# Patient Record
Sex: Male | Born: 1965
Health system: Southern US, Community
[De-identification: ages and names within clinical notes are randomized; demographics above are authoritative.]

## PROBLEM LIST (undated history)

## (undated) DIAGNOSIS — Z9289 Personal history of other medical treatment: Secondary | ICD-10-CM

## (undated) DIAGNOSIS — I2510 Atherosclerotic heart disease of native coronary artery without angina pectoris: Secondary | ICD-10-CM

## (undated) DIAGNOSIS — I639 Cerebral infarction, unspecified: Secondary | ICD-10-CM

## (undated) DIAGNOSIS — I10 Essential (primary) hypertension: Secondary | ICD-10-CM

## (undated) DIAGNOSIS — E785 Hyperlipidemia, unspecified: Secondary | ICD-10-CM

## (undated) DIAGNOSIS — K219 Gastro-esophageal reflux disease without esophagitis: Secondary | ICD-10-CM

## (undated) DIAGNOSIS — D8685 Sarcoid myocarditis: Secondary | ICD-10-CM

## (undated) DIAGNOSIS — Z95811 Presence of heart assist device: Secondary | ICD-10-CM

## (undated) DIAGNOSIS — I509 Heart failure, unspecified: Secondary | ICD-10-CM

## (undated) DIAGNOSIS — Z87442 Personal history of urinary calculi: Secondary | ICD-10-CM

## (undated) DIAGNOSIS — J449 Chronic obstructive pulmonary disease, unspecified: Secondary | ICD-10-CM

---

## 2019-01-17 DIAGNOSIS — I5020 Unspecified systolic (congestive) heart failure: Secondary | ICD-10-CM

## 2019-01-17 DIAGNOSIS — R000 Tachycardia, unspecified: Secondary | ICD-10-CM

## 2019-01-17 DIAGNOSIS — I472 Ventricular tachycardia: Secondary | ICD-10-CM

## 2019-01-17 HISTORY — DX: Ventricular tachycardia: I47.2

## 2019-01-17 HISTORY — DX: Unspecified systolic (congestive) heart failure: I50.20

## 2019-01-17 HISTORY — DX: Tachycardia, unspecified: R00.0

## 2019-06-27 LAB — HM COLONOSCOPY

## 2019-12-15 DIAGNOSIS — I361 Nonrheumatic tricuspid (valve) insufficiency: Secondary | ICD-10-CM

## 2019-12-15 DIAGNOSIS — I340 Nonrheumatic mitral (valve) insufficiency: Secondary | ICD-10-CM | POA: Diagnosis not present

## 2019-12-15 DIAGNOSIS — R079 Chest pain, unspecified: Secondary | ICD-10-CM

## 2019-12-15 DIAGNOSIS — I429 Cardiomyopathy, unspecified: Secondary | ICD-10-CM | POA: Diagnosis not present

## 2019-12-15 DIAGNOSIS — I472 Ventricular tachycardia: Secondary | ICD-10-CM

## 2019-12-15 DIAGNOSIS — R778 Other specified abnormalities of plasma proteins: Secondary | ICD-10-CM | POA: Diagnosis not present

## 2019-12-16 ENCOUNTER — Encounter (HOSPITAL_COMMUNITY)
Admission: AD | Disposition: A | Discharge status: Home / Self Care | Payer: Self-pay | Source: Ambulatory Visit | Attending: Cardiology

## 2019-12-16 ENCOUNTER — Inpatient Hospital Stay (HOSPITAL_COMMUNITY)
Admission: AD | Admit: 2019-12-16 | Discharge: 2019-12-19 | DRG: 286 | Disposition: A | Discharge status: Home / Self Care | Payer: BLUE CROSS/BLUE SHIELD | Source: Ambulatory Visit | Attending: Cardiology | Admitting: Cardiology

## 2019-12-16 ENCOUNTER — Encounter (HOSPITAL_COMMUNITY): Payer: Self-pay | Admitting: Cardiology

## 2019-12-16 ENCOUNTER — Other Ambulatory Visit: Payer: Self-pay

## 2019-12-16 DIAGNOSIS — I083 Combined rheumatic disorders of mitral, aortic and tricuspid valves: Secondary | ICD-10-CM | POA: Diagnosis present

## 2019-12-16 DIAGNOSIS — I429 Cardiomyopathy, unspecified: Secondary | ICD-10-CM

## 2019-12-16 DIAGNOSIS — I10 Essential (primary) hypertension: Secondary | ICD-10-CM | POA: Diagnosis present

## 2019-12-16 DIAGNOSIS — I472 Ventricular tachycardia: Secondary | ICD-10-CM | POA: Diagnosis not present

## 2019-12-16 DIAGNOSIS — R000 Tachycardia, unspecified: Secondary | ICD-10-CM

## 2019-12-16 DIAGNOSIS — I5023 Acute on chronic systolic (congestive) heart failure: Secondary | ICD-10-CM | POA: Diagnosis present

## 2019-12-16 DIAGNOSIS — Z87891 Personal history of nicotine dependence: Secondary | ICD-10-CM

## 2019-12-16 DIAGNOSIS — K219 Gastro-esophageal reflux disease without esophagitis: Secondary | ICD-10-CM | POA: Diagnosis present

## 2019-12-16 DIAGNOSIS — I272 Pulmonary hypertension, unspecified: Secondary | ICD-10-CM | POA: Diagnosis present

## 2019-12-16 DIAGNOSIS — Z885 Allergy status to narcotic agent status: Secondary | ICD-10-CM

## 2019-12-16 DIAGNOSIS — I248 Other forms of acute ischemic heart disease: Secondary | ICD-10-CM | POA: Diagnosis present

## 2019-12-16 DIAGNOSIS — N179 Acute kidney failure, unspecified: Secondary | ICD-10-CM

## 2019-12-16 DIAGNOSIS — I5041 Acute combined systolic (congestive) and diastolic (congestive) heart failure: Secondary | ICD-10-CM | POA: Diagnosis present

## 2019-12-16 DIAGNOSIS — R079 Chest pain, unspecified: Secondary | ICD-10-CM | POA: Diagnosis not present

## 2019-12-16 DIAGNOSIS — E782 Mixed hyperlipidemia: Secondary | ICD-10-CM | POA: Diagnosis not present

## 2019-12-16 DIAGNOSIS — Z8249 Family history of ischemic heart disease and other diseases of the circulatory system: Secondary | ICD-10-CM

## 2019-12-16 DIAGNOSIS — I5021 Acute systolic (congestive) heart failure: Secondary | ICD-10-CM | POA: Diagnosis present

## 2019-12-16 DIAGNOSIS — R778 Other specified abnormalities of plasma proteins: Secondary | ICD-10-CM

## 2019-12-16 DIAGNOSIS — I2510 Atherosclerotic heart disease of native coronary artery without angina pectoris: Secondary | ICD-10-CM | POA: Diagnosis present

## 2019-12-16 DIAGNOSIS — I25118 Atherosclerotic heart disease of native coronary artery with other forms of angina pectoris: Secondary | ICD-10-CM | POA: Diagnosis not present

## 2019-12-16 DIAGNOSIS — E785 Hyperlipidemia, unspecified: Secondary | ICD-10-CM | POA: Diagnosis present

## 2019-12-16 DIAGNOSIS — I110 Hypertensive heart disease with heart failure: Secondary | ICD-10-CM | POA: Diagnosis present

## 2019-12-16 DIAGNOSIS — I428 Other cardiomyopathies: Secondary | ICD-10-CM | POA: Diagnosis present

## 2019-12-16 DIAGNOSIS — I5022 Chronic systolic (congestive) heart failure: Secondary | ICD-10-CM

## 2019-12-16 HISTORY — PX: RIGHT/LEFT HEART CATH AND CORONARY ANGIOGRAPHY: CATH118266

## 2019-12-16 HISTORY — DX: Essential (primary) hypertension: I10

## 2019-12-16 HISTORY — DX: Gastro-esophageal reflux disease without esophagitis: K21.9

## 2019-12-16 LAB — POCT I-STAT 7, (LYTES, BLD GAS, ICA,H+H)
Acid-Base Excess: 6 mmol/L — ABNORMAL HIGH (ref 0.0–2.0)
Bicarbonate: 34.1 mmol/L — ABNORMAL HIGH (ref 20.0–28.0)
Calcium, Ion: 1.17 mmol/L (ref 1.15–1.40)
HCT: 45 % (ref 39.0–52.0)
Hemoglobin: 15.3 g/dL (ref 13.0–17.0)
O2 Saturation: 100 %
Potassium: 4 mmol/L (ref 3.5–5.1)
Sodium: 143 mmol/L (ref 135–145)
TCO2: 36 mmol/L — ABNORMAL HIGH (ref 22–32)
pCO2 arterial: 66 mmHg (ref 32.0–48.0)
pH, Arterial: 7.322 — ABNORMAL LOW (ref 7.350–7.450)
pO2, Arterial: 191 mmHg — ABNORMAL HIGH (ref 83.0–108.0)

## 2019-12-16 LAB — POCT I-STAT EG7
Acid-Base Excess: 4 mmol/L — ABNORMAL HIGH (ref 0.0–2.0)
Acid-Base Excess: 4 mmol/L — ABNORMAL HIGH (ref 0.0–2.0)
Bicarbonate: 33.5 mmol/L — ABNORMAL HIGH (ref 20.0–28.0)
Bicarbonate: 33.6 mmol/L — ABNORMAL HIGH (ref 20.0–28.0)
Calcium, Ion: 1.23 mmol/L (ref 1.15–1.40)
Calcium, Ion: 1.24 mmol/L (ref 1.15–1.40)
HCT: 46 % (ref 39.0–52.0)
HCT: 46 % (ref 39.0–52.0)
Hemoglobin: 15.6 g/dL (ref 13.0–17.0)
Hemoglobin: 15.6 g/dL (ref 13.0–17.0)
O2 Saturation: 71 %
O2 Saturation: 73 %
Potassium: 4.1 mmol/L (ref 3.5–5.1)
Potassium: 4.2 mmol/L (ref 3.5–5.1)
Sodium: 143 mmol/L (ref 135–145)
Sodium: 143 mmol/L (ref 135–145)
TCO2: 36 mmol/L — ABNORMAL HIGH (ref 22–32)
TCO2: 36 mmol/L — ABNORMAL HIGH (ref 22–32)
pCO2, Ven: 68.9 mmHg — ABNORMAL HIGH (ref 44.0–60.0)
pCO2, Ven: 69.3 mmHg — ABNORMAL HIGH (ref 44.0–60.0)
pH, Ven: 7.294 (ref 7.250–7.430)
pH, Ven: 7.295 (ref 7.250–7.430)
pO2, Ven: 43 mmHg (ref 32.0–45.0)
pO2, Ven: 44 mmHg (ref 32.0–45.0)

## 2019-12-16 LAB — CREATININE, SERUM
Creatinine, Ser: 0.98 mg/dL (ref 0.61–1.24)
GFR, Estimated: >60 mL/min (ref >60)

## 2019-12-16 LAB — CBC
HCT: 55.3 % — ABNORMAL HIGH (ref 39.0–52.0)
Hemoglobin: 17.7 g/dL — ABNORMAL HIGH (ref 13.0–17.0)
MCH: 31.7 pg (ref 26.0–34.0)
MCHC: 32 g/dL (ref 30.0–36.0)
MCV: 99.1 fL (ref 80.0–100.0)
Platelets: 173 10*3/uL (ref 150–400)
RBC: 5.58 MIL/uL (ref 4.22–5.81)
RDW: 11.5 % (ref 11.5–15.5)
WBC: 7 10*3/uL (ref 4.0–10.5)
nRBC: 0 % (ref 0.0–0.2)

## 2019-12-16 SURGERY — RIGHT/LEFT HEART CATH AND CORONARY ANGIOGRAPHY
Anesthesia: LOCAL

## 2019-12-16 MED ORDER — METOPROLOL SUCCINATE ER 25 MG PO TB24
25.0000 mg | ORAL_TABLET | Freq: Every day | ORAL | Status: DC
  Administered 2019-12-16 – 2019-12-17 (×2): 25 mg via ORAL
  Filled 2019-12-16 (×3): qty 1

## 2019-12-16 MED ORDER — ENOXAPARIN SODIUM 40 MG/0.4ML ~~LOC~~ SOLN
40.0000 mg | SUBCUTANEOUS | Status: DC
  Administered 2019-12-17 – 2019-12-19 (×3): 40 mg via SUBCUTANEOUS
  Filled 2019-12-16 (×3): qty 0.4

## 2019-12-16 MED ORDER — FENTANYL CITRATE (PF) 100 MCG/2ML IJ SOLN
INTRAMUSCULAR | Status: DC | PRN
  Administered 2019-12-16: 50 ug via INTRAVENOUS

## 2019-12-16 MED ORDER — LIDOCAINE HCL (PF) 1 % IJ SOLN
INTRAMUSCULAR | Status: AC
  Filled 2019-12-16: qty 30

## 2019-12-16 MED ORDER — SODIUM CHLORIDE 0.9 % IV SOLN: 250.0000 mL | INTRAVENOUS | Status: DC | PRN

## 2019-12-16 MED ORDER — VERAPAMIL HCL 2.5 MG/ML IV SOLN
INTRAVENOUS | Status: DC | PRN
  Administered 2019-12-16: 10 mL via INTRA_ARTERIAL

## 2019-12-16 MED ORDER — LIDOCAINE HCL (PF) 1 % IJ SOLN
INTRAMUSCULAR | Status: DC | PRN
  Administered 2019-12-16: 5 mL

## 2019-12-16 MED ORDER — HEPARIN (PORCINE) IN NACL 1000-0.9 UT/500ML-% IV SOLN
INTRAVENOUS | Status: AC
  Filled 2019-12-16: qty 1000

## 2019-12-16 MED ORDER — HEPARIN SODIUM (PORCINE) 1000 UNIT/ML IJ SOLN
INTRAMUSCULAR | Status: DC | PRN
  Administered 2019-12-16: 4000 [IU] via INTRAVENOUS

## 2019-12-16 MED ORDER — LOSARTAN POTASSIUM 25 MG PO TABS
25.0000 mg | ORAL_TABLET | Freq: Every day | ORAL | Status: DC
  Administered 2019-12-16 – 2019-12-17 (×2): 25 mg via ORAL
  Filled 2019-12-16 (×3): qty 1

## 2019-12-16 MED ORDER — IOHEXOL 350 MG/ML SOLN
INTRAVENOUS | Status: DC | PRN
  Administered 2019-12-16: 50 mL via INTRA_ARTERIAL

## 2019-12-16 MED ORDER — HEPARIN (PORCINE) IN NACL 1000-0.9 UT/500ML-% IV SOLN
INTRAVENOUS | Status: DC | PRN
  Administered 2019-12-16 (×2): 500 mL

## 2019-12-16 MED ORDER — FUROSEMIDE 10 MG/ML IJ SOLN
INTRAMUSCULAR | Status: AC
  Filled 2019-12-16: qty 4

## 2019-12-16 MED ORDER — ASPIRIN 81 MG PO CHEW
CHEWABLE_TABLET | ORAL | Status: AC
  Filled 2019-12-16: qty 1

## 2019-12-16 MED ORDER — HEPARIN SODIUM (PORCINE) 1000 UNIT/ML IJ SOLN
INTRAMUSCULAR | Status: AC
  Filled 2019-12-16: qty 1

## 2019-12-16 MED ORDER — FENTANYL CITRATE (PF) 100 MCG/2ML IJ SOLN
INTRAMUSCULAR | Status: AC
  Filled 2019-12-16: qty 2

## 2019-12-16 MED ORDER — SODIUM CHLORIDE 0.9% FLUSH
3.0000 mL | Freq: Two times a day (BID) | INTRAVENOUS | Status: DC
  Administered 2019-12-16 – 2019-12-19 (×4): 3 mL via INTRAVENOUS

## 2019-12-16 MED ORDER — ACETAMINOPHEN 325 MG PO TABS
650.0000 mg | ORAL_TABLET | ORAL | Status: DC | PRN
  Administered 2019-12-18: 650 mg via ORAL
  Filled 2019-12-16: qty 2

## 2019-12-16 MED ORDER — ONDANSETRON HCL 4 MG/2ML IJ SOLN: 4.0000 mg | Freq: Four times a day (QID) | INTRAMUSCULAR | Status: DC | PRN

## 2019-12-16 MED ORDER — SODIUM CHLORIDE 0.9% FLUSH: 3.0000 mL | INTRAVENOUS | Status: DC | PRN

## 2019-12-16 MED ORDER — SODIUM CHLORIDE 0.9 % IV SOLN: INTRAVENOUS | Status: DC

## 2019-12-16 MED ORDER — SODIUM CHLORIDE 0.9% FLUSH
3.0000 mL | Freq: Two times a day (BID) | INTRAVENOUS | Status: DC
  Administered 2019-12-17 – 2019-12-18 (×4): 3 mL via INTRAVENOUS

## 2019-12-16 MED ORDER — VERAPAMIL HCL 2.5 MG/ML IV SOLN
INTRAVENOUS | Status: AC
  Filled 2019-12-16: qty 2

## 2019-12-16 MED ORDER — MIDAZOLAM HCL 2 MG/2ML IJ SOLN
INTRAMUSCULAR | Status: DC | PRN
  Administered 2019-12-16: 2 mg via INTRAVENOUS

## 2019-12-16 MED ORDER — MIDAZOLAM HCL 2 MG/2ML IJ SOLN
INTRAMUSCULAR | Status: AC
  Filled 2019-12-16: qty 2

## 2019-12-16 MED ORDER — ASPIRIN 81 MG PO CHEW
81.0000 mg | CHEWABLE_TABLET | ORAL | Status: AC
  Administered 2019-12-16: 81 mg via ORAL

## 2019-12-16 MED ORDER — FUROSEMIDE 10 MG/ML IJ SOLN
40.0000 mg | Freq: Two times a day (BID) | INTRAMUSCULAR | Status: AC
  Administered 2019-12-16 – 2019-12-17 (×3): 40 mg via INTRAVENOUS
  Filled 2019-12-16 (×2): qty 4

## 2019-12-16 SURGICAL SUPPLY — 15 items
CATH 5FR JL3.5 JR4 ANG PIG MP (CATHETERS) ×2 IMPLANT
CATH INFINITI 5FR JK (CATHETERS) ×2 IMPLANT
CATH INFINITI 5FR JL4 (CATHETERS) ×2 IMPLANT
CATH LAUNCHER 5F RADL (CATHETERS) ×1 IMPLANT
CATH SWAN GANZ 7F STRAIGHT (CATHETERS) ×2 IMPLANT
CATHETER LAUNCHER 5F RADL (CATHETERS) ×2
DEVICE RAD COMP TR BAND LRG (VASCULAR PRODUCTS) ×2 IMPLANT
GLIDESHEATH SLEND SS 6F .021 (SHEATH) ×2 IMPLANT
GLIDESHEATH SLENDER 7FR .021G (SHEATH) ×2 IMPLANT
GUIDEWIRE INQWIRE 1.5J.035X260 (WIRE) ×1 IMPLANT
INQWIRE 1.5J .035X260CM (WIRE) ×2
KIT HEART LEFT (KITS) ×2 IMPLANT
PACK CARDIAC CATHETERIZATION (CUSTOM PROCEDURE TRAY) ×2 IMPLANT
TRANSDUCER W/STOPCOCK (MISCELLANEOUS) ×2 IMPLANT
TUBING CIL FLEX 10 FLL-RA (TUBING) ×2 IMPLANT

## 2019-12-16 NOTE — Progress Notes (Signed)
Patient received from [facility]. Patient alert and oriented. Denies chest pain or shortness of breath at this time. VSS. Patient placed on monitor. IVs patent. Consent obtained. Normal saline initated. ASA 81mg  given per order. Dr. to see patient.[place]

## 2019-12-16 NOTE — Plan of Care (Signed)

## 2019-12-16 NOTE — Interval H&P Note (Signed)
History and Physical Interval Note:  12/16/2019 2:41 PM  [facility]  has presented today for surgery, with the diagnosis of chest pain.  The various methods of treatment have been discussed with the patient and family. After consideration of risks, benefits and other options for treatment, the patient has consented to  Procedure(s): RIGHT/LEFT HEART CATH AND CORONARY ANGIOGRAPHY (N/A) as a surgical intervention.  The patient's history has been reviewed, patient examined, no change in status, stable for surgery.  I have reviewed the patient's chart and labs.  Questions were answered to the patient's satisfaction.    Cath Lab Visit (complete for each Cath Lab visit)  Clinical Evaluation Leading to the Procedure:   ACS: Yes.    Non-ACS:    Anginal Classification: CCS III  Anti-ischemic medical therapy: No Therapy  Non-Invasive Test Results: No non-invasive testing performed  Prior CABG: No previous CABG       [name] [facility] 12/16/2019 2:41 PM

## 2019-12-16 NOTE — H&P (Signed)
Cardiology Admission History and Physical:   Patient ID: [facility] MRN: [id number]; DOB: 1965-07-21   Admission date: 12/16/2019  Primary Care Provider: [name], PA-C [facility] Cardiologist: Norman Herrlich, MD  [facility] Electrophysiologist:  None   Chief Complaint:  Chest pain   Patient Profile:   [facility] is a 54 y.o. male with PMH of HTN and GERD who presented to [facility] with chest pain. Found to have a reduced EF, and episode of VT. Transferred to [facility] for further management.   History of Present Illness:   [facility] is a 54 yo male with PMH noted above. He has not been seen by cardiology in the past. Presented to [facility] with chest pain.  Reported over the last several weeks he has had increasing fatigue and dyspnea with activity. Reports he recently completed treatment for PNA. The morning of admission he was getting ready and loading furniture into a work truck whenever he started having burning pain in his chest.  No associated shortness of breath or nausea.  Pain continued through the morning and he decided to come home from work.  On the way home he checked his HR with his pulse ox and noted his heart rate was in the 150s.  Ultimately presented to the ED for further evaluation was found to have a wide-complex tachycardia.  He was given diltiazem without conversion, then given IV metoprolol and heart rate improved.  Troponin was noted at 0.18.  Cardiology was consulted who recommended admission with echocardiogram.  Echocardiogram showed EF of 25 to 30% impaired diastolic filling, mild aortic regurg, mild mitral regurgitation and tricuspid regurgitation.  Of note troponin I peaked at 1.19.  Decision made to transfer to [facility] for further management with cardiac catheterization.   Past Medical History:  Diagnosis Date   GERD (gastroesophageal reflux disease)    Hypertension     History reviewed. No pertinent surgical history.    Medications Prior to Admission: Prior to Admission medications   Not on File     Allergies:    Allergies  Allergen Reactions   Percocet [Oxycodone-Acetaminophen] Itching    Social History:   Social History   Socioeconomic History   Marital status: Divorced    Spouse name: Not on file   Number of children: Not on file   Years of education: Not on file   Highest education level: Not on file  Occupational History   Not on file  Tobacco Use   Smoking status: Not on file  Substance and Sexual Activity   Alcohol use: Not on file   Drug use: Not on file   Sexual activity: Not on file  Other Topics Concern   Not on file  Social History Narrative   Not on file   Social Determinants of Health   Financial Resource Strain:    Difficulty of Paying Living Expenses: Not on file  Food Insecurity:    Worried About Running Out of Food in the Last Year: Not on file   Ran Out of Food in the Last Year: Not on file  Transportation Needs:    Lack of Transportation (Medical): Not on file   Lack of Transportation (Non-Medical): Not on file  Physical Activity:    Days of Exercise per Week: Not on file   Minutes of Exercise per Session: Not on file  Stress:    Feeling of Stress : Not on file  Social Connections:    Frequency of Communication with  Friends and Family: Not on file   Frequency of Social Gatherings with Friends and Family: Not on file   Attends Religious Services: Not on file   Active Member of Clubs or Organizations: Not on file   Attends Banker Meetings: Not on file   Marital Status: Not on file  Intimate Partner Violence:    Fear of Current or Ex-Partner: Not on file   Emotionally Abused: Not on file   Physically Abused: Not on file   Sexually Abused: Not on file    Family History:   The patient's family history includes Heart disease in his father and mother; Hypertension in his father.    ROS:  Please see the  history of present illness.  All other ROS reviewed and negative.     Physical Exam/Data:  There were no vitals filed for this visit. No intake or output data in the 24 hours ending 12/16/19 1419 No flowsheet data found.   There is no height or weight on file to calculate BMI.  General:  Well nourished, well developed, in no acute distress HEENT: normal Lymph: no adenopathy Neck: no JVD Endocrine:  No thryomegaly Vascular: No carotid bruits; FA pulses 2+ bilaterally without bruits  Cardiac:  normal S1, S2; RRR; no murmur  Lungs:  clear to auscultation bilaterally, no wheezing, rhonchi or rales  Abd: soft, nontender, no hepatomegaly  Ext: no edema Musculoskeletal:  No deformities, BUE and BLE strength normal and equal Skin: warm and dry  Neuro:  CNs 2-12 intact, no focal abnormalities noted Psych:  Normal affect    EKG:  The ECG that was done 12/15/19 was personally reviewed and demonstrates WCT rate 157bpm  Relevant CV Studies:  Echo: EF 25-30% ( done at [facility])  Laboratory Data:  High Sensitivity Troponin:  No results for input(s): TROPONINIHS in the last 720 hours.    ChemistryNo results for input(s): NA, K, CL, CO2, GLUCOSE, BUN, CREATININE, CALCIUM, GFRNONAA, GFRAA, ANIONGAP in the last 168 hours.  No results for input(s): PROT, ALBUMIN, AST, ALT, ALKPHOS, BILITOT in the last 168 hours. HematologyNo results for input(s): WBC, RBC, HGB, HCT, MCV, MCH, MCHC, RDW, PLT in the last 168 hours. BNPNo results for input(s): BNP, PROBNP in the last 168 hours.  DDimer No results for input(s): DDIMER in the last 168 hours.   Radiology/Studies:  No results found.   Assessment and Plan:   [facility] is a 54 y.o. male with PMH of HTN and GERD who presented to [facility] with chest pain. Found to have a reduced EF, and episode of VT. Transferred to [facility] for further management.   1. Chest pain: presented to [facility] with fatigue and chest pain. Trop I peaked at  1.19. EKG with WCT noted on admission. Now is transferred to [facility] for further management with plans for cardiac cath. --The patient understands that risks included but are not limited to stroke (1 in 1000), death (1 in 1000), kidney failure [usually temporary] (1 in 500), bleeding (1 in 200), allergic reaction [possibly serious] (1 in 200).   2. WCT: concerning for VT. Given IV dilt without improvement, conversion with IV metoprolol.  --started on Toprol XL 25mg  daily at [facility]  3. Acute combined systolic/diastolic HF: EF noted at 25-30% with impaired diastolic filling on echo at [facility]. Started on Toprol XL 25mg  daily, along with lisinopril at [facility]. Does not appear volume overloaded on exam -- would favor holding lisinopril with possible transition to Entresto  4.  HTN: on Toprol XL, as above hold lisinopril.  [id number]} HEAR Score (for undifferentiated chest pain):  HEAR Score: 2   Severity of Illness: The appropriate patient status for this patient is OBSERVATION. Observation status is judged to be reasonable and necessary in order to provide the required intensity of service to ensure the patient's safety. The patient's presenting symptoms, physical exam findings, and initial radiographic and laboratory data in the context of their medical condition is felt to place them at decreased risk for further clinical deterioration. Furthermore, it is anticipated that the patient will be medically stable for discharge from the hospital within 2 midnights of admission. The following factors support the patient status of observation.   " The patient's presenting symptoms include chest pain, shortness of breath. " The physical exam findings include stable exam. " The initial radiographic and laboratory data are elevated troponin, EKG concerning for VT, Echo with EF of 25-30%.     For questions or updates, please contact [facility] Please consult [email or web address] for contact info under      Signed, Laverda Page, NP  12/16/2019 2:19 PM

## 2019-12-17 ENCOUNTER — Encounter (HOSPITAL_COMMUNITY): Payer: Self-pay | Admitting: Cardiology

## 2019-12-17 DIAGNOSIS — R079 Chest pain, unspecified: Secondary | ICD-10-CM

## 2019-12-17 DIAGNOSIS — I472 Ventricular tachycardia: Secondary | ICD-10-CM | POA: Diagnosis not present

## 2019-12-17 DIAGNOSIS — I5041 Acute combined systolic (congestive) and diastolic (congestive) heart failure: Secondary | ICD-10-CM | POA: Diagnosis not present

## 2019-12-17 LAB — BASIC METABOLIC PANEL
Anion gap: 12 (ref 5–15)
BUN: 13 mg/dL (ref 6–20)
CO2: 29 mmol/L (ref 22–32)
Calcium: 9.2 mg/dL (ref 8.9–10.3)
Chloride: 100 mmol/L (ref 98–111)
Creatinine, Ser: 0.94 mg/dL (ref 0.61–1.24)
GFR, Estimated: >60 mL/min (ref >60)
Glucose, Bld: 81 mg/dL (ref 70–99)
Potassium: 3.9 mmol/L (ref 3.5–5.1)
Sodium: 141 mmol/L (ref 135–145)

## 2019-12-17 MED ORDER — FUROSEMIDE 40 MG PO TABS
40.0000 mg | ORAL_TABLET | Freq: Every day | ORAL | Status: DC
  Administered 2019-12-17: 40 mg via ORAL
  Filled 2019-12-17: qty 1

## 2019-12-17 MED ORDER — POTASSIUM CHLORIDE CRYS ER 20 MEQ PO TBCR
20.0000 meq | EXTENDED_RELEASE_TABLET | Freq: Once | ORAL | Status: AC
  Administered 2019-12-17: 20 meq via ORAL
  Filled 2019-12-17: qty 1

## 2019-12-17 MED ORDER — ATORVASTATIN CALCIUM 80 MG PO TABS
80.0000 mg | ORAL_TABLET | Freq: Every day | ORAL | Status: DC
  Administered 2019-12-17 – 2019-12-19 (×3): 80 mg via ORAL
  Filled 2019-12-17 (×3): qty 1

## 2019-12-17 MED ORDER — ASPIRIN EC 81 MG PO TBEC
81.0000 mg | DELAYED_RELEASE_TABLET | Freq: Every day | ORAL | Status: DC
  Administered 2019-12-17 – 2019-12-19 (×3): 81 mg via ORAL
  Filled 2019-12-17 (×3): qty 1

## 2019-12-17 MED ORDER — SPIRONOLACTONE 12.5 MG HALF TABLET
12.5000 mg | ORAL_TABLET | Freq: Every day | ORAL | Status: DC
  Administered 2019-12-17 – 2019-12-19 (×3): 12.5 mg via ORAL
  Filled 2019-12-17 (×3): qty 1

## 2019-12-17 NOTE — Consult Note (Addendum)
Cardiology Consultation:   Patient ID: Charles [facility] MRN: [id number]; DOB: [birth year]  Admit date: 12/16/2019 Date of Consult: 12/17/2019  Primary Care Provider: [name], PA-C [facility] Cardiologist: Norman Herrlich, MD  [facility] Electrophysiologist:  None    Patient Profile:   Charles [facility] is a 54 y.o. male with a hx of HTN, GERD only who is being seen today for the evaluation of WCT/VT, new CM, consideration for ICD at the request of Dr. Swaziland.  History of Present Illness:   [name] was admitted to [facility] 12/15/2019 with new onset palpitations, chest discomfort.  He reported a few weeks of progressive fatigue and SOB/DOE and had been recently treated for a PNA. 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  AT [facility] noted in a WCT, he was initially given diltiazem without change, followed by IV lopressor that seemed to convert him to SR,  He was seen in consult by Dr. Tomie China, his consult note mentions discussing the case with Dr. Ladona Ridgel regarding VT and was recommended ischemic eval/cath, and he was transferred to [facility], his echo from [place] noted LVEF 25-30%, no significant VHD.  maintainined on Toprol, he has not gotten any AAD  He underwent cath yesterday  Prox LAD lesion is 70% stenosed. LV end diastolic pressure is moderately elevated. Hemodynamic findings consistent with moderate pulmonary hypertension.   1. Single vessel borderline obstructive disease involving the proximal LAD.  2. Moderately elevated LV filling pressures 3. Moderate pulmonary HTN 4. Preserved cardiac output with index 2.41.    Plan: would focus on optimizing medical therapy. He needs IV diuresis. Aspynn Clover switch lisinopril to losartan with the idea of switching to Entresto in 72 hours. Continue  Toprol XL. Consider addition of aldactone. The degree of LV dysfunction is out of proportion to his CAD. If he does have anginal symptoms after optimizing medical therapy we could consider reevaluation of the proximal LAD for stenting. Consider EP evaluation of wide complex tachycardia.     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  He noted about 3 mo ago he was more tire in general, not as much energy, and as time passed getting winded with his usual work, and tasks.  He saw his PMD a few weeks ago with slight cough/SOB and was treated for PNA.  Not sure if he felt better or not. He reports the day he went to the ER when at home he felt a burning discomfort in his chest, loaded up his tools, and was on his way to a job when he told his employee he was just going to go home, not feeling well. He had bought a pulse ox to monitor his oxygen when dx with PNA and his O2 was 95% and his HR 150s He at no time had any awareness of his heartbeat, no palpitations, racing. He went to his daughter's house and she brought him to the [facility]. He did not at any time feel lightheaded, no  near syncope or syncope. Denies any syncope hx Both of his parents have heart disease, his mom as well with CHF both are still alive in their 80's No hx of SCD or unexplained young deaths in his family. He has a sister alive, has had a stroke   Past Medical History:  Diagnosis Date   GERD (gastroesophageal reflux disease)    Hypertension     Past Surgical History:  Procedure Laterality Date   RIGHT/LEFT HEART CATH AND CORONARY ANGIOGRAPHY N/A 12/16/2019   Procedure: RIGHT/LEFT HEART CATH AND CORONARY ANGIOGRAPHY;  Surgeon: Swaziland, Peter M,  MD;  Location: [facility] INVASIVE CV LAB;  Service: Cardiovascular;  Laterality: N/A;     Home Medications:  Prior to Admission medications   Medication Sig Start Date End Date Taking? Authorizing Provider  Aspirin-Salicylamide-Caffeine (BC HEADACHE POWDER PO) Take 1 packet by mouth as needed (for headaches or pain).    Yes [provider]  diclofenac (VOLTAREN) 75 MG EC tablet Take 75 mg by mouth daily with breakfast.   Yes [provider]  lisinopril (ZESTRIL) 10 MG tablet Take 10 mg by mouth daily.   Yes [provider]  Multiple Vitamins-Minerals (ONE-A-DAY MENS 50+ ADVANTAGE) TABS Take 1 tablet by mouth daily with breakfast.   Yes [provider]  pantoprazole (PROTONIX) 40 MG tablet Take 40 mg by mouth daily before breakfast.   Yes [provider]  VENTOLIN HFA 108 (90 Base) MCG/ACT inhaler Inhale 2 puffs into the lungs every 6 (six) hours as needed for wheezing or shortness of breath.  08/13/19  Yes [provider]    Inpatient Medications: Scheduled Meds:  aspirin EC  81 mg Oral Daily   atorvastatin  80 mg Oral Daily   enoxaparin (LOVENOX) injection  40 mg Subcutaneous Q24H   furosemide  40 mg Intravenous Q12H   furosemide  40 mg Oral Daily   losartan  25 mg Oral Daily   metoprolol succinate  25 mg Oral Daily   sodium chloride flush  3 mL Intravenous Q12H   sodium chloride flush  3 mL Intravenous Q12H   spironolactone  12.5 mg Oral Daily   Continuous Infusions:  sodium chloride     sodium chloride     PRN Meds: sodium chloride, sodium chloride, acetaminophen, ondansetron (ZOFRAN) IV, sodium chloride flush, sodium chloride flush  Allergies:    Allergies  Allergen Reactions   Percocet [Oxycodone-Acetaminophen] Itching    Social History:   Social History   Socioeconomic History   Marital status: Divorced    Spouse name: Not on file   Number of children: Not on file   Years of education: Not on file   Highest education  level: Not on file  Occupational History   Not on file  Tobacco Use   Smoking status: Former Smoker    Types: Cigarettes   Smokeless tobacco: Never Used  Vaping Use   Vaping Use: Unknown  Substance and Sexual Activity   Alcohol use: Yes    Alcohol/week: 6.0 standard drinks    Types: 6 Cans of beer per week   Drug use: Never   Sexual activity: Yes  Other Topics Concern   Not on file  Social History Narrative   Not on file   Social Determinants of Health   Financial Resource Strain:    Difficulty of Paying Living Expenses: Not on file  Food Insecurity:    Worried About Running Out of Food in the Last Year: Not on file  Ran Out of Food in the Last Year: Not on file  Transportation Needs:    Lack of Transportation (Medical): Not on file   Lack of Transportation (Non-Medical): Not on file  Physical Activity:    Days of Exercise per Week: Not on file   Minutes of Exercise per Session: Not on file  Stress:    Feeling of Stress : Not on file  Social Connections:    Frequency of Communication with Friends and Family: Not on file   Frequency of Social Gatherings with Friends and Family: Not on file   Attends Religious Services: Not on file   Active Member of Clubs or Organizations: Not on file   Attends Banker Meetings: Not on file   Marital Status: Not on file  Intimate Partner Violence:    Fear of Current or Ex-Partner: Not on file   Emotionally Abused: Not on file   Physically Abused: Not on file   Sexually Abused: Not on file    Family History:    Family History  Problem Relation Age of Onset   Hypertension Father    Heart disease Father    Heart disease Mother      ROS:  Please see the history of present illness.  All other ROS reviewed and negative.     Physical Exam/Data:   Vitals:   12/16/19 2022 12/16/19 2351 12/17/19 0447 12/17/19 0752  BP: 116/77 129/81 121/71 111/80  Pulse: 69 (!) 57 62 65  Resp: 18 18 16 16   Temp: 98.3 F (36.8 C)  97.9 F (36.6 C) 97.9 F (36.6 C) 97.8 F (36.6 C)  TempSrc: Oral Oral Oral Oral  SpO2: 98% 95% 91% 95%  Weight:   78.7 kg   Height:        Intake/Output Summary (Last 24 hours) at 12/17/2019 1102 Last data filed at 12/17/2019 1008 Gross per 24 hour  Intake 200 ml  Output 1600 ml  Net -1400 ml   Last 3 Weights 12/17/2019 12/16/2019  Weight (lbs) 173 lb 8 oz 164 lb 1.6 oz  Weight (kg) 78.699 kg 74.435 kg     Body mass index is 26.38 kg/m.  General:  Well nourished, well developed, in no acute distress HEENT: normal Lymph: no adenopathy Neck: no JVD Endocrine:  No thryomegaly Vascular: No carotid bruits  Cardiac: RRR; no murmurs, gallops or rubs Lungs:  CTA b/l, no wheezing, rhonchi or rales  Abd: soft, nontender Ext: no edema Musculoskeletal:  No deformities Skin: warm and dry  Neuro:  no focal abnormalities noted Psych:  Normal affect   EKG:  The EKG was personally reviewed and demonstrates:  12/18/2019: #1 WCT 157bpm #2 SB 58bpm, RBBB, LAD   Today: SR 63bpm, RBBB, LAD   Telemetry:  Telemetry was personally reviewed and demonstrates:   SB/SR 50's- mostly 60's    Relevant CV Studies:  12/16/2019: LHC Prox LAD lesion is 70% stenosed. LV end diastolic pressure is moderately elevated. Hemodynamic findings consistent with moderate pulmonary hypertension.   1. Single vessel borderline obstructive disease involving the proximal LAD.  2. Moderately elevated LV filling pressures 3. Moderate pulmonary HTN 4. Preserved cardiac output with index 2.41.    Plan: would focus on optimizing medical therapy. He needs IV diuresis. Wendle Kina switch lisinopril to losartan with the idea of switching to Entresto in 72 hours. Continue Toprol XL. Consider addition of aldactone. The degree of LV dysfunction is out of proportion to his CAD. If he does have anginal symptoms  after optimizing medical therapy we could consider reevaluation of the proximal LAD for stenting. Consider EP evaluation  of wide complex tachycardia.      Laboratory Data:  High Sensitivity Troponin:  No results for input(s): TROPONINIHS in the last 720 hours.   Chemistry Recent Labs  Lab 12/16/19 1515 12/16/19 1516 12/16/19 1821 12/17/19 0346  NA 143 143  --  141  K 4.1 4.2  --  3.9  CL  --   --   --  100  CO2  --   --   --  29  GLUCOSE  --   --   --  81  BUN  --   --   --  13  CREATININE  --   --  0.98 0.94  CALCIUM  --   --   --  9.2  GFRNONAA  --   --  >60 >60  ANIONGAP  --   --   --  12    No results for input(s): PROT, ALBUMIN, AST, ALT, ALKPHOS, BILITOT in the last 168 hours. Hematology Recent Labs  Lab 12/16/19 1515 12/16/19 1516 12/16/19 1821  WBC  --   --  7.0  RBC  --   --  5.58  HGB 15.6 15.6 17.7*  HCT 46.0 46.0 55.3*  MCV  --   --  99.1  MCH  --   --  31.7  MCHC  --   --  32.0  RDW  --   --  11.5  PLT  --   --  173   BNPNo results for input(s): BNP, PROBNP in the last 168 hours.  DDimer No results for input(s): DDIMER in the last 168 hours.   Radiology/Studies:    Assessment and Plan:   1. WCT     Likely is VT, though tele tracing with break out initially morphology is unchanged.      2. New NICM 3. CAD     Borderline LAD lesion, not intervened upon     CM felt out of proportion to his CAD  Young man, without much in the way of medical history Suspect he had HF not PNA  I Elsworth Ledin review his EKG and tele strip with Dr. Elberta Fortis and he Deiondre Harrower see the patient If VT Keyia Moretto plan to get a c/MRI to evaluate his CM further and discuss ICD  I discussed this with the patient Cr. Wave Calzada see the patient later today   {  For questions or updates, please contact [facility] Please consult [email or web address] for contact info under    Signed, Sheilah Pigeon, PA-C  12/17/2019 11:02 AM  I have seen and examined this patient with Francis Dowse.  Agree with above, note added to reflect my findings.  On exam, RRR, no murmurs.  [facility] to [facility] after an  episode of wide-complex tachycardia.  He had an echo that showed a reduced ejection fraction.  He had a left heart catheterization that showed a 70% LAD lesion, but his cardiomyopathy is out of proportion to his coronary disease.  His wide-complex tachycardia is certainly concerning for VT.  That being said, it did terminate after getting up metoprolol.  He Kathyjo Briere need optimal therapy for his heart failure.  To further evaluate the cause of his heart failure, Kimberely Mccannon order a cardiac MRI today.  He may benefit from continued optimal therapy prior to ICD implant.  I Olive Motyka discuss with him post MRI whether or not LifeVest would be  appropriate.  Jerri Glauser M. Nevaen Tredway MD 12/17/2019 3:05 PM

## 2019-12-17 NOTE — Progress Notes (Addendum)
Progress Note  Patient Name: Charles Holmes Date of Encounter: 12/17/2019  [facility] Cardiologist: Norman Herrlich, MD   Subjective   No chest pain overnight. Stable rhythm on telemetry  Inpatient Medications    Scheduled Meds: . enoxaparin (LOVENOX) injection  40 mg Subcutaneous Q24H  . furosemide  40 mg Intravenous Q12H  . losartan  25 mg Oral Daily  . metoprolol succinate  25 mg Oral Daily  . sodium chloride flush  3 mL Intravenous Q12H  . sodium chloride flush  3 mL Intravenous Q12H   Continuous Infusions: . sodium chloride    . sodium chloride     PRN Meds: sodium chloride, sodium chloride, acetaminophen, ondansetron (ZOFRAN) IV, sodium chloride flush, sodium chloride flush   Vital Signs    Vitals:   12/16/19 2022 12/16/19 2351 12/17/19 0447 12/17/19 0752  BP: 116/77 129/81 121/71 111/80  Pulse: 69 (!) 57 62 65  Resp: 18 18 16 16   Temp: 98.3 F (36.8 C) 97.9 F (36.6 C) 97.9 F (36.6 C) 97.8 F (36.6 C)  TempSrc: Oral Oral Oral Oral  SpO2: 98% 95% 91% 95%  Weight:   78.7 kg   Height:        Intake/Output Summary (Last 24 hours) at 12/17/2019 14/01/2019 Last data filed at 12/17/2019 0445 Gross per 24 hour  Intake --  Output 700 ml  Net -700 ml   Last 3 Weights 12/17/2019 12/16/2019  Weight (lbs) 173 lb 8 oz 164 lb 1.6 oz  Weight (kg) 78.699 kg 74.435 kg      Telemetry    SR - Personally Reviewed  ECG    SR, RBBB with TWI in lateral leads - Personally Reviewed  Physical Exam  Pleasant older WM GEN: No acute distress.   Neck: No JVD Cardiac: RRR, no murmurs, rubs, or gallops.  Respiratory: Clear to auscultation bilaterally. GI: Soft, nontender, non-distended  MS: No edema; No deformity. Right radial cath site stable.  Neuro:  Nonfocal  Psych: Normal affect   Labs    High Sensitivity Troponin:  No results for input(s): TROPONINIHS in the last 720 hours.    Chemistry Recent Labs  Lab 12/16/19 1515 12/16/19 1516 12/16/19 1821  12/17/19 0346  NA 143 143  --  141  K 4.1 4.2  --  3.9  CL  --   --   --  100  CO2  --   --   --  29  GLUCOSE  --   --   --  81  BUN  --   --   --  13  CREATININE  --   --  0.98 0.94  CALCIUM  --   --   --  9.2  GFRNONAA  --   --  >60 >60  ANIONGAP  --   --   --  12     Hematology Recent Labs  Lab 12/16/19 1515 12/16/19 1516 12/16/19 1821  WBC  --   --  7.0  RBC  --   --  5.58  HGB 15.6 15.6 17.7*  HCT 46.0 46.0 55.3*  MCV  --   --  99.1  MCH  --   --  31.7  MCHC  --   --  32.0  RDW  --   --  11.5  PLT  --   --  173    BNPNo results for input(s): BNP, PROBNP in the last 168 hours.   DDimer No results for input(s): DDIMER  in the last 168 hours.   Radiology    CARDIAC CATHETERIZATION  Result Date: 12/16/2019  Prox LAD lesion is 70% stenosed.  LV end diastolic pressure is moderately elevated.  Hemodynamic findings consistent with moderate pulmonary hypertension.  1. Single vessel borderline obstructive disease involving the proximal LAD. 2. Moderately elevated LV filling pressures 3. Moderate pulmonary HTN 4. Preserved cardiac output with index 2.41. Plan: would focus on optimizing medical therapy. He needs IV diuresis. Will switch lisinopril to losartan with the idea of switching to Entresto in 72 hours. Continue Toprol XL. Consider addition of aldactone. The degree of LV dysfunction is out of proportion to his CAD. If he does have anginal symptoms after optimizing medical therapy we could consider reevaluation of the proximal LAD for stenting. Consider EP evaluation of wide complex tachycardia.    Cardiac Studies   Cath: 12/16/19   Prox LAD lesion is 70% stenosed.  LV end diastolic pressure is moderately elevated.  Hemodynamic findings consistent with moderate pulmonary hypertension.   1. Single vessel borderline obstructive disease involving the proximal LAD.  2. Moderately elevated LV filling pressures 3. Moderate pulmonary HTN 4. Preserved cardiac output  with index 2.41.   Plan: would focus on optimizing medical therapy. He needs IV diuresis. Will switch lisinopril to losartan with the idea of switching to Entresto in 72 hours. Continue Toprol XL. Consider addition of aldactone. The degree of LV dysfunction is out of proportion to his CAD. If he does have anginal symptoms after optimizing medical therapy we could consider reevaluation of the proximal LAD for stenting. Consider EP evaluation of wide complex tachycardia.   Diagnostic Dominance: Right  Echo: (see paper chart) at [facility]. EF 25-30%  Patient Profile     Charles Holmes with PMH of HTN and GERD who presented to [facility] with chest pain. Found to have a reduced EF, and episode of VT. Transferred to [place] [facility] for further management.   Assessment & Plan    1. Chest pain: presented to [facility] with fatigue and chest pain. Trop I peaked at 1.19. EKG with WCT noted on admission that responded to IV metoprolol. Transferred to [facility] and underwent cardiac cath noted above with 70% lesion in the pLAD not felt to be significant. Recommendation for medical therapy.  -- ASA, statin, BB, ARB   2. WCT: concerning for VT on initial EKG at [facility]. Given IV dilt without improvement, conversion with IV metoprolol. No further episodes noted on telemetry overnight. No associated lightheadedness, or syncope with that episode.  --started on Toprol XL 25mg  daily at [facility] -- suppl K+ this morning -- will ask EP for eval, may be more appropriate for Lifevest as he has not be on GMDT prior to admission.   3. Acute combined systolic/diastolic HF: EF noted severely reduced at 25-30% with impaired diastolic filling on echo at [place]. Started on Toprol XL 25mg  daily, along with lisinopril at [place]. Switched to losartan post cath.  -- will check benefits on Entresto with plans to transition after 36 hours last dose of lisinopril. Will plan to start tomorrow -- consider adding spiro is blood  pressures tolerate -- continue IV lasix today with plans to transition to oral dosing tomorrow (LVEDP 34 on cath)  4. HTN: stable with current therapy  For questions or updates, please contact [facility] Please consult [email or web address] for contact info under        Signed, , NP  12/17/2019, 8:22 AM    Patient seen and examined  and agree with Laverda Page, NP.  Patient is doing well. No further chest pain. Asking when he can go home. EP in the room evaluating the patient as well.  Exam: GEN: No acute distress.   Neck: No JVD Cardiac: RRR, no murmurs, rubs, or gallops.  Respiratory: Clear to auscultation bilaterally. GI: Soft, nontender, non-distended  MS: No edema; No deformity. Right radial cath site c/d/i Neuro:  Nonfocal  Psych: Normal affect   Plan: -Plan for medical optimization of underlying HF -If recurrent angina despite medical optimization, can re-evaluate LAD lesion at that time -Continue ASA, statin -Transitioned to ARB with plans for entresto prior to discharge -IV lasix today with plans to transition to PO tomorrow -Start spironolactone 12.5mg  today -SGLT-2 inhibitor prior to discharge -EP consulted for WCT (?VT vs SVT with aberrancy); appreciate recommendations  Laurance Flatten, MD

## 2019-12-17 NOTE — Care Management (Signed)
2505 12-17-19 Benefits check submitted for Entresto. Case Manager will follow for cost. Graves-Bigelow, Lamar Laundry, RN,BSN Case Manager

## 2019-12-18 DIAGNOSIS — E782 Mixed hyperlipidemia: Secondary | ICD-10-CM | POA: Diagnosis not present

## 2019-12-18 DIAGNOSIS — I5021 Acute systolic (congestive) heart failure: Secondary | ICD-10-CM | POA: Diagnosis not present

## 2019-12-18 DIAGNOSIS — I472 Ventricular tachycardia: Secondary | ICD-10-CM | POA: Diagnosis not present

## 2019-12-18 LAB — LIPID PANEL
Cholesterol: 237 mg/dL — ABNORMAL HIGH (ref 0–200)
HDL: 35 mg/dL — ABNORMAL LOW (ref >40)
LDL Cholesterol: 178 mg/dL — ABNORMAL HIGH (ref 0–99)
Total CHOL/HDL Ratio: 6.8 RATIO
Triglycerides: 119 mg/dL (ref <150)
VLDL: 24 mg/dL (ref 0–40)

## 2019-12-18 LAB — BASIC METABOLIC PANEL
Anion gap: 15 (ref 5–15)
BUN: 27 mg/dL — ABNORMAL HIGH (ref 6–20)
CO2: 27 mmol/L (ref 22–32)
Calcium: 9.4 mg/dL (ref 8.9–10.3)
Chloride: 98 mmol/L (ref 98–111)
Creatinine, Ser: 1.34 mg/dL — ABNORMAL HIGH (ref 0.61–1.24)
GFR, Estimated: >60 mL/min (ref >60)
Glucose, Bld: 106 mg/dL — ABNORMAL HIGH (ref 70–99)
Potassium: 3.9 mmol/L (ref 3.5–5.1)
Sodium: 140 mmol/L (ref 135–145)

## 2019-12-18 LAB — HEMOGLOBIN A1C
Hgb A1c MFr Bld: 5.1 % (ref 4.8–5.6)
Mean Plasma Glucose: 99.67 mg/dL

## 2019-12-18 LAB — HEPATIC FUNCTION PANEL
ALT: 38 U/L (ref 0–44)
AST: 42 U/L — ABNORMAL HIGH (ref 15–41)
Albumin: 3.6 g/dL (ref 3.5–5.0)
Alkaline Phosphatase: 49 U/L (ref 38–126)
Bilirubin, Direct: 0.2 mg/dL (ref 0.0–0.2)
Indirect Bilirubin: 1.5 mg/dL — ABNORMAL HIGH (ref 0.3–0.9)
Total Bilirubin: 1.7 mg/dL — ABNORMAL HIGH (ref 0.3–1.2)
Total Protein: 6.3 g/dL — ABNORMAL LOW (ref 6.5–8.1)

## 2019-12-18 LAB — MAGNESIUM
Magnesium: 2.3 mg/dL (ref 1.7–2.4)
Magnesium: 2.2 mg/dL (ref 1.7–2.4)

## 2019-12-18 MED ORDER — METOPROLOL SUCCINATE ER 50 MG PO TB24
50.0000 mg | ORAL_TABLET | Freq: Every day | ORAL | Status: DC
  Administered 2019-12-18 – 2019-12-19 (×2): 50 mg via ORAL
  Filled 2019-12-18 (×2): qty 1

## 2019-12-18 MED ORDER — POTASSIUM CHLORIDE CRYS ER 20 MEQ PO TBCR
20.0000 meq | EXTENDED_RELEASE_TABLET | Freq: Once | ORAL | Status: AC
  Administered 2019-12-18: 20 meq via ORAL
  Filled 2019-12-18: qty 1

## 2019-12-18 MED ORDER — MAGNESIUM SULFATE IN D5W 1-5 GM/100ML-% IV SOLN
1.0000 g | Freq: Once | INTRAVENOUS | Status: AC
  Administered 2019-12-18: 1 g via INTRAVENOUS
  Filled 2019-12-18: qty 100

## 2019-12-18 NOTE — Progress Notes (Addendum)
Progress Note  Patient Name: Charles Holmes Date of Encounter: 12/18/2019  [facility] Cardiologist: Norman Herrlich, MD   Subjective   Had an episode of breathlessness last night, no palpitations or dizziness  Inpatient Medications    Scheduled Meds:  aspirin EC  81 mg Oral Daily   atorvastatin  80 mg Oral Daily   enoxaparin (LOVENOX) injection  40 mg Subcutaneous Q24H   metoprolol succinate  50 mg Oral Daily   potassium chloride  20 mEq Oral Once   sodium chloride flush  3 mL Intravenous Q12H   sodium chloride flush  3 mL Intravenous Q12H   spironolactone  12.5 mg Oral Daily   Continuous Infusions:  sodium chloride     sodium chloride     PRN Meds: sodium chloride, sodium chloride, acetaminophen, ondansetron (ZOFRAN) IV, sodium chloride flush, sodium chloride flush   Vital Signs    Vitals:   12/17/19 1846 12/17/19 2044 12/18/19 0017 12/18/19 0439  BP: 119/76 114/76 103/69 109/73  Pulse: 72 66 61 62  Resp: 18 18 18 18   Temp:  98.3 F (36.8 C) 98.1 F (36.7 C) 97.8 F (36.6 C)  TempSrc:  Oral Oral Oral  SpO2:  97%  93%  Weight:    78.3 kg  Height:        Intake/Output Summary (Last 24 hours) at 12/18/2019 0843 Last data filed at 12/18/2019 0500 Gross per 24 hour  Intake 960 ml  Output 2550 ml  Net -1590 ml   Last 3 Weights 12/18/2019 12/17/2019 12/16/2019  Weight (lbs) 172 lb 9.9 oz 173 lb 8 oz 164 lb 1.6 oz  Weight (kg) 78.3 kg 78.699 kg 74.435 kg      Telemetry    SR, infrequentlt V bigemeby, rare NSVT, 6-8 beats - Personally Reviewed  ECG    No new EKGs - Personally Reviewed  Physical Exam   GEN: No acute distress.   Neck: No JVD Cardiac: RRR, no murmurs, rubs, or gallops.  Respiratory: CTA b/l. GI: Soft, nontender, non-distended  MS: No edema; No deformity. Neuro:  Nonfocal  Psych: Normal affect   Labs    High Sensitivity Troponin:  No results for input(s): TROPONINIHS in the last 720 hours.    Chemistry Recent Labs  Lab  12/16/19 1516 12/16/19 1821 12/17/19 0346 12/18/19 0147  NA 143  --  141 140  K 4.2  --  3.9 3.9  CL  --   --  100 98  CO2  --   --  29 27  GLUCOSE  --   --  81 106*  BUN  --   --  13 27*  CREATININE  --  0.98 0.94 1.34*  CALCIUM  --   --  9.2 9.4  PROT  --   --   --  6.3*  ALBUMIN  --   --   --  3.6  AST  --   --   --  42*  ALT  --   --   --  38  ALKPHOS  --   --   --  49  BILITOT  --   --   --  1.7*  GFRNONAA  --  >60 >60 >60  ANIONGAP  --   --  12 15     Hematology Recent Labs  Lab 12/16/19 1515 12/16/19 1516 12/16/19 1821  WBC  --   --  7.0  RBC  --   --  5.58  HGB 15.6  15.6 17.7*  HCT 46.0 46.0 55.3*  MCV  --   --  99.1  MCH  --   --  31.7  MCHC  --   --  32.0  RDW  --   --  11.5  PLT  --   --  173    BNPNo results for input(s): BNP, PROBNP in the last 168 hours.   DDimer No results for input(s): DDIMER in the last 168 hours.   Radiology    CARDIAC CATHETERIZATION  Result Date: 12/16/2019  Prox LAD lesion is 70% stenosed.  LV end diastolic pressure is moderately elevated.  Hemodynamic findings consistent with moderate pulmonary hypertension.  1. Single vessel borderline obstructive disease involving the proximal LAD. 2. Moderately elevated LV filling pressures 3. Moderate pulmonary HTN 4. Preserved cardiac output with index 2.41. Plan: would focus on optimizing medical therapy. He needs IV diuresis. [facility] switch lisinopril to losartan with the idea of switching to Entresto in 72 hours. Continue Toprol XL. Consider addition of aldactone. The degree of LV dysfunction is out of proportion to his CAD. If he does have anginal symptoms after optimizing medical therapy we could consider reevaluation of the proximal LAD for stenting. Consider EP evaluation of wide complex tachycardia.    Cardiac Studies   12/16/2019: [facility] Prox LAD lesion is 70% stenosed. LV end diastolic pressure is moderately elevated. Hemodynamic findings consistent with moderate pulmonary  hypertension.   1. Single vessel borderline obstructive disease involving the proximal LAD.  2. Moderately elevated LV filling pressures 3. Moderate pulmonary HTN 4. Preserved cardiac output with index 2.41.    Plan: would focus on optimizing medical therapy. He needs IV diuresis. [facility] switch lisinopril to losartan with the idea of switching to Entresto in 72 hours. Continue Toprol XL. Consider addition of aldactone. The degree of LV dysfunction is out of proportion to his CAD. If he does have anginal symptoms after optimizing medical therapy we could consider reevaluation of the proximal LAD for stenting. Consider EP evaluation of wide complex tachycardia.   Patient Profile     54 y.o. male with a hx of HTN, GERD admitted with VT, new CM  Assessment & Plan    1. VT     Some PVCs, bigeminy and a NSVT on tele      Increased Toprol to 50     C.MRI is pending     Recommend life vest and follow up EF on GDMT, if not recovered ICD implant     I Charles [facility] make EP follow up in 45months  2. New NICM 3. CAD     Borderline LAD lesion, not intervened upon     CM felt out of proportion to his CAD     C/w cards service  For questions or updates, please contact [facility] Please consult [email or web address] for contact info under        Signed, Sheilah Pigeon, PA-C  12/18/2019, 8:43 AM    I have seen and examined this patient with [facility].  Agree with above, note added to reflect my findings.  On exam, RRR, no murmurs.  Patient is feeling well this morning.  He did get short of breath overnight last night.  He has plan for cardiac MRI today to further determine the etiology of his heart failure.  He did have some nonsustained VT overnight last night as well as quite a bit of ventricular ectopy.  Despite that, he was minimally symptomatic.  At this point, would discharge  him with optimal medical therapy.  We [facility] increase his Toprol-XL to 50 mg which should be increased further if at all possible.   Due to his high ectopy burden, would send him home with a LifeVest.  Should his EF recover on his follow-up echo, his LifeVest can be removed.  Charles Vacca M. Lindsea Olivar MD 12/18/2019 8:52 AM

## 2019-12-18 NOTE — Progress Notes (Signed)
Pt stated that he "could not catch his breath", this RN placed pt on 2L via [place], O2 sats improving from 92% initially to 97% and pt now resting comfortably. Will continue to monitor.   Bari Edward, RN

## 2019-12-18 NOTE — Progress Notes (Signed)
    Order faxed for Lifevest and rep notified. Currently pending insurance approval and cardiac MRI.   SignedLaverda Page, NP-C 12/18/2019, 3:50 PM Pager: [phone]

## 2019-12-18 NOTE — Progress Notes (Signed)
Pt noted to be in ventricular bigeminy (strip saved), converted back to sinus brady. Pt resting comfortably, asymptomatic. Mackie Pai, MD paged and notified. Will continue to monitor.   Bari Edward, RN

## 2019-12-18 NOTE — Progress Notes (Signed)
Heart Failure Stewardship Pharmacist Progress Note   PCP: [name], PA-C PCP-Cardiologist: Norman Herrlich, MD    HPI:  54 yo M with PMH of HTN and GERD. He presented to the ED with chest pain and was found to have reduced EF (25-30%), and episode of VT. He was then transferred to [facility] for further management. L/RHC done 12/16/19 and was found to have single vessel borderline obstructive disease of prox LAD with moderately elevated filling pressure, moderate pulm HTN, and cardiac index 2.41.  Current HF Medications: Metoprolol XL 50 mg daily Spironolactone 12.5 mg daily  Prior to admission HF Medications: Lisinopril 10 mg daily   Pertinent Lab Values: . Serum creatinine 1.34, BUN 27, Potassium 3.9, Sodium 140, Magnesium 2.2   Vital Signs: . Weight: 172 lbs (admission weight: 173 lbs) . Blood pressure: 100/70s  . Heart rate: 60-70s   Medication Assistance / Insurance Benefits Check: Does the patient have prescription insurance?  Yes Type of insurance plan: commercial plan - patient can utilize monthly copay cards to bring brand name medication cost down to $0-10 per month  Outpatient Pharmacy:  Prior to admission [facility] Is the patient willing to use [facility] TOC pharmacy at discharge? Yes Is the patient willing to transition their outpatient pharmacy to utilize a [facility] outpatient pharmacy?   Pending    Assessment: 1. Acute systolic CHF (EF 45-80%), due to ICM. NYHA class II symptoms. Pending MRI. - Continue metoprolol XL 25 mg daily - Losartan held this AM due to increase SCr. Consider starting Entresto 24/26 mg BID in AM if SCr improves - Continue spironolactone 12.5 mg daily - consider increasing to target 25 mg daily pending SCr improvement and BP - Consider starting Farxiga prior to discharge if SCr allows   Plan: 1) Medication changes recommended at this time: - Continue current regimen with consideration to add Entresto on 12/3 if SCr improves  2)  Patient assistance: Test claims completed for the following medications: - Entresto: $20 per month - Farxiga $15 per month Patient is eligible to use copay cards to bring monthly copays from $0-$10 per month   3)  Education  - To be completed prior to discharge  Sharen Hones, PharmD, BCPS Heart Failure Engineer, building services Phone [phone]

## 2019-12-18 NOTE — Care Management (Signed)
1427 12-18-19 Information submitted to [employer] for Life Vest for insurance approval. Case Manager will continue to follow. Graves-Bigelow, Lamar Laundry, RN, BSN Case Manager

## 2019-12-18 NOTE — Progress Notes (Signed)
Pt had a 9-beat-run of V-tach, resting comfortably with no complaints of chest pain or shortness of breath. Mackie Pai, MD paged. Will continue to monitor.  Bari Edward, RN

## 2019-12-18 NOTE — TOC Benefit Eligibility Note (Signed)
Transition of Care [facility]) Benefit Eligibility Note    Patient Details  Name: Charles [facility] MRN: [id number] Date of Birth: August 21, 1965   Medication/Dose: Sherryll Burger 24/26mg .bid and or 49/51mg  bid  30 day supply  Covered?: Yes  Tier: 3 Drug  Prescription Coverage [facility]: [facility]  Spoke with Person/Company/Phone Number:: Isbelle P. [employer] PH# [phone]  Co-Pay: $20.00  Prior Approval: No  Deductible:  (No Deductible)       Renie Ora Phone Number: 12/18/2019, 11:14 AM

## 2019-12-18 NOTE — Progress Notes (Addendum)
Progress Note  Patient Name: Charles Holmes Date of Encounter: 12/18/2019  [facility] Cardiologist: Norman Herrlich, MD   Subjective   Episode of shortness of breath this morning, placed on 2L [place] with improvement. Had bigeminy and short run of NSVT overnight. Was not symptomatic with this.   Inpatient Medications    Scheduled Meds: . aspirin EC  81 mg Oral Daily  . atorvastatin  80 mg Oral Daily  . enoxaparin (LOVENOX) injection  40 mg Subcutaneous Q24H  . furosemide  40 mg Oral Daily  . losartan  25 mg Oral Daily  . metoprolol succinate  25 mg Oral Daily  . sodium chloride flush  3 mL Intravenous Q12H  . sodium chloride flush  3 mL Intravenous Q12H  . spironolactone  12.5 mg Oral Daily   Continuous Infusions: . sodium chloride    . sodium chloride     PRN Meds: sodium chloride, sodium chloride, acetaminophen, ondansetron (ZOFRAN) IV, sodium chloride flush, sodium chloride flush   Vital Signs    Vitals:   12/17/19 1846 12/17/19 2044 12/18/19 0017 12/18/19 0439  BP: 119/76 114/76 103/69 109/73  Pulse: 72 66 61 62  Resp: 18 18 18 18   Temp:  98.3 F (36.8 C) 98.1 F (36.7 C) 97.8 F (36.6 C)  TempSrc:  Oral Oral Oral  SpO2:  97%  93%  Weight:    78.3 kg  Height:        Intake/Output Summary (Last 24 hours) at 12/18/2019 0753 Last data filed at 12/18/2019 0500 Gross per 24 hour  Intake 960 ml  Output 2550 ml  Net -1590 ml   Last 3 Weights 12/18/2019 12/17/2019 12/16/2019  Weight (lbs) 172 lb 9.9 oz 173 lb 8 oz 164 lb 1.6 oz  Weight (kg) 78.3 kg 78.699 kg 74.435 kg      Telemetry    SR with run of ventriculer bigeminy and short run of NSVT- Personally Reviewed  ECG    No new tracing this morning  Physical Exam  Pleasant older male GEN: No acute distress.   Neck: No JVD Cardiac: RRR, no murmurs, rubs, or gallops.  Respiratory: Clear to auscultation bilaterally. GI: Soft, nontender, non-distended  MS: No edema; No deformity. Neuro:  Nonfocal    Psych: Normal affect   Labs    High Sensitivity Troponin:  No results for input(s): TROPONINIHS in the last 720 hours.    Chemistry Recent Labs  Lab 12/16/19 1516 12/16/19 1821 12/17/19 0346 12/18/19 0147  NA 143  --  141 140  K 4.2  --  3.9 3.9  CL  --   --  100 98  CO2  --   --  29 27  GLUCOSE  --   --  81 106*  BUN  --   --  13 27*  CREATININE  --  0.98 0.94 1.34*  CALCIUM  --   --  9.2 9.4  PROT  --   --   --  6.3*  ALBUMIN  --   --   --  3.6  AST  --   --   --  42*  ALT  --   --   --  38  ALKPHOS  --   --   --  49  BILITOT  --   --   --  1.7*  GFRNONAA  --  >60 >60 >60  ANIONGAP  --   --  12 15     Hematology Recent Labs  Lab 12/16/19 1515 12/16/19 1516 12/16/19 1821  WBC  --   --  7.0  RBC  --   --  5.58  HGB 15.6 15.6 17.7*  HCT 46.0 46.0 55.3*  MCV  --   --  99.1  MCH  --   --  31.7  MCHC  --   --  32.0  RDW  --   --  11.5  PLT  --   --  173    BNPNo results for input(s): BNP, PROBNP in the last 168 hours.   DDimer No results for input(s): DDIMER in the last 168 hours.   Radiology    CARDIAC CATHETERIZATION  Result Date: 12/16/2019  Prox LAD lesion is 70% stenosed.  LV end diastolic pressure is moderately elevated.  Hemodynamic findings consistent with moderate pulmonary hypertension.  1. Single vessel borderline obstructive disease involving the proximal LAD. 2. Moderately elevated LV filling pressures 3. Moderate pulmonary HTN 4. Preserved cardiac output with index 2.41. Plan: would focus on optimizing medical therapy. He needs IV diuresis. Will switch lisinopril to losartan with the idea of switching to Entresto in 72 hours. Continue Toprol XL. Consider addition of aldactone. The degree of LV dysfunction is out of proportion to his CAD. If he does have anginal symptoms after optimizing medical therapy we could consider reevaluation of the proximal LAD for stenting. Consider EP evaluation of wide complex tachycardia.    Cardiac Studies    Cath: 12/16/19   Prox LAD lesion is 70% stenosed.  LV end diastolic pressure is moderately elevated.  Hemodynamic findings consistent with moderate pulmonary hypertension.  1. Single vessel borderline obstructive disease involving the proximal LAD.  2. Moderately elevated LV filling pressures 3. Moderate pulmonary HTN 4. Preserved cardiac output with index 2.41.   Plan: would focus on optimizing medical therapy. He needs IV diuresis. Will switch lisinopril to losartan with the idea of switching to Entresto in 72 hours. Continue Toprol XL. Consider addition of aldactone. The degree of LV dysfunction is out of proportion to his CAD. If he does have anginal symptoms after optimizing medical therapy we could consider reevaluation of the proximal LAD for stenting. Consider EP evaluation of wide complex tachycardia.   Diagnostic Dominance: Right  Echo: (see paper chart) at [facility]. EF 25-30%  Patient Profile     54 y.o. male with PMH of HTN and GERD who presented to [facility] with chest pain. Found to have a reduced EF, and episode of VT. Transferred to [facility] for further management.  Assessment & Plan    1. Chest pain:[facility]. Trop I peaked at 1.19. EKG with WCT noted on admission that responded to IV metoprolol. Transferred to [facility] and underwent cardiac cath noted above with 70% lesion in the pLAD not felt to be significant. Recommendation for medical therapy.  -- ASA, statin, BB, ARB ( holding today with elevated Cr)   2. TKW:[id number] for VT on initial EKG at [facility]. Given IV dilt without improvement, conversion with IV metoprolol. No further episodes noted on telemetry overnight. No associated lightheadedness, or syncope with that episode.  --started on Toprol XL 25mg  daily at [name], increase to 50mg  today --seen by EP with plans for cardiac MRI today, further management pending results  3. Acute combined  systolic/diastolic HF/NICM:EF noted severely reduced at 25-30% with impaired diastolic filling on echo at [place]. Started on Toprol XL 25mg  daily, along with lisinopril at [place]. Switched to losartan post cath.  --  will check benefits on Entresto with plans to transition after 36 hours last dose of lisinopril. Cr elevated this morning. Will hold losartan today -- transitioned from IV to PO lasix, net - 2L (LVEDP 34 on cath). Will hold additional lasix today.  4. HTN: stable with current therapy  5. HLD: LDL 178, high dose statin started this admission  For questions or updates, please contact [facility] Please consult [email or web address] for contact info under        Signed, Laverda Page, NP  12/18/2019, 7:53 AM    I have examined the patient and reviewed assessment and plan and discussed with patient.  Agree with above as stated.  Await MRI.    Medical therapy for LV dysfunction.  EP wanted to lifevest.  Patient is agreeable.    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  He has some stress since he is self employed and has a big job to do before Christmas.  He does not want to lose this particular job.   Lance Muss

## 2019-12-19 ENCOUNTER — Other Ambulatory Visit: Payer: Self-pay | Admitting: Physician Assistant

## 2019-12-19 ENCOUNTER — Inpatient Hospital Stay (HOSPITAL_COMMUNITY): Payer: BLUE CROSS/BLUE SHIELD

## 2019-12-19 DIAGNOSIS — I5021 Acute systolic (congestive) heart failure: Secondary | ICD-10-CM

## 2019-12-19 DIAGNOSIS — E782 Mixed hyperlipidemia: Secondary | ICD-10-CM

## 2019-12-19 DIAGNOSIS — I472 Ventricular tachycardia: Secondary | ICD-10-CM

## 2019-12-19 DIAGNOSIS — I2510 Atherosclerotic heart disease of native coronary artery without angina pectoris: Secondary | ICD-10-CM

## 2019-12-19 DIAGNOSIS — N179 Acute kidney failure, unspecified: Secondary | ICD-10-CM

## 2019-12-19 DIAGNOSIS — I25118 Atherosclerotic heart disease of native coronary artery with other forms of angina pectoris: Secondary | ICD-10-CM

## 2019-12-19 DIAGNOSIS — E785 Hyperlipidemia, unspecified: Secondary | ICD-10-CM

## 2019-12-19 LAB — BASIC METABOLIC PANEL
Anion gap: 10 (ref 5–15)
BUN: 23 mg/dL — ABNORMAL HIGH (ref 6–20)
CO2: 30 mmol/L (ref 22–32)
Calcium: 9.1 mg/dL (ref 8.9–10.3)
Chloride: 102 mmol/L (ref 98–111)
Creatinine, Ser: 1.03 mg/dL (ref 0.61–1.24)
GFR, Estimated: >60 mL/min (ref >60)
Glucose, Bld: 94 mg/dL (ref 70–99)
Potassium: 3.9 mmol/L (ref 3.5–5.1)
Sodium: 142 mmol/L (ref 135–145)

## 2019-12-19 MED ORDER — SPIRONOLACTONE 25 MG PO TABS
12.5000 mg | ORAL_TABLET | Freq: Every day | ORAL | 3 refills | Status: DC
Start: 2019-12-19 — End: 2021-03-16

## 2019-12-19 MED ORDER — METOPROLOL SUCCINATE ER 50 MG PO TB24
50.0000 mg | ORAL_TABLET | Freq: Every day | ORAL | 3 refills | Status: DC
Start: 2019-12-19 — End: 2020-02-03

## 2019-12-19 MED ORDER — ASPIRIN 81 MG PO TBEC
81.0000 mg | DELAYED_RELEASE_TABLET | Freq: Every day | ORAL | 3 refills | Status: DC
Start: 2019-12-19 — End: 2022-07-26

## 2019-12-19 MED ORDER — DAPAGLIFLOZIN PROPANEDIOL 10 MG PO TABS
10.0000 mg | ORAL_TABLET | Freq: Every day | ORAL | 11 refills | Status: DC
Start: 2019-12-19 — End: 2019-12-19

## 2019-12-19 MED ORDER — DAPAGLIFLOZIN PROPANEDIOL 10 MG PO TABS
10.0000 mg | ORAL_TABLET | Freq: Every day | ORAL | Status: DC
  Administered 2019-12-19: 10 mg via ORAL
  Filled 2019-12-19: qty 1

## 2019-12-19 MED ORDER — ATORVASTATIN CALCIUM 80 MG PO TABS
80.0000 mg | ORAL_TABLET | Freq: Every day | ORAL | 3 refills | Status: DC
Start: 2019-12-19 — End: 2020-12-13

## 2019-12-19 MED ORDER — GADOBUTROL 1 MMOL/ML IV SOLN
10.0000 mL | Freq: Once | INTRAVENOUS | Status: AC | PRN
  Administered 2019-12-19: 10 mL via INTRAVENOUS

## 2019-12-19 MED ORDER — LOSARTAN POTASSIUM 25 MG PO TABS
25.0000 mg | ORAL_TABLET | Freq: Every day | ORAL | Status: DC
  Administered 2019-12-19: 25 mg via ORAL
  Filled 2019-12-19: qty 1

## 2019-12-19 MED ORDER — LOSARTAN POTASSIUM 25 MG PO TABS
25.0000 mg | ORAL_TABLET | Freq: Every day | ORAL | 3 refills | Status: DC
Start: 2019-12-19 — End: 2020-12-17

## 2019-12-19 MED ORDER — DAPAGLIFLOZIN PROPANEDIOL 10 MG PO TABS
10.0000 mg | ORAL_TABLET | Freq: Every day | ORAL | 11 refills | Status: DC
Start: 2019-12-19 — End: 2020-04-26

## 2019-12-19 MED FILL — FARXIGA 10 MG TABLET: 10 | 30 days supply | Qty: 30 | Fill #0

## 2019-12-19 MED FILL — METOPROLOL SUCCINATE ER 50: 50 | 90 days supply | Qty: 90 | Fill #0

## 2019-12-19 MED FILL — SPIRONOLACTONE 25 MG TABLET: 25 | 90 days supply | Qty: 45 | Fill #0

## 2019-12-19 MED FILL — LOSARTAN POTASSIUM 25 MG TA: 25 | 90 days supply | Qty: 90 | Fill #0

## 2019-12-19 MED FILL — ASPIRIN LOW DOSE 81 MG TBEC: 81 | 90 days supply | Qty: 90 | Fill #0

## 2019-12-19 MED FILL — ATORVASTATIN CALCIUM 80 MG: 80 | 90 days supply | Qty: 90 | Fill #0

## 2019-12-19 NOTE — Progress Notes (Signed)
Patient returned from MRI.

## 2019-12-19 NOTE — Progress Notes (Signed)
Patient taken in [facility] for MRI.

## 2019-12-19 NOTE — Plan of Care (Signed)
  Problem: Coping: Goal: Level of anxiety will decrease Outcome: Progressing   Problem: Safety: Goal: Ability to remain free from injury will improve Outcome: Progressing   Problem: Education: Goal: Understanding of CV disease, CV risk reduction, and recovery process will improve Outcome: Progressing

## 2019-12-19 NOTE — Discharge Summary (Addendum)
Discharge Summary    Patient ID: Charles Holmes MRN: [id number]; DOB: 1965-10-25  Admit date: 12/16/2019 Discharge date: 12/19/2019  Primary Care Provider: [name], PA-C  Primary Cardiologist: Garwin Brothers, MD  Primary Electrophysiologist:  None   Discharge Diagnoses    Principal Problem:   Acute systolic CHF (congestive heart failure) (HCC) Active Problems:   Wide-complex tachycardia (HCC)   HTN (hypertension)   AKI (acute kidney injury) (HCC)   CAD (coronary artery disease)   Hyperlipidemia with target LDL less than 70   Diagnostic Studies/Procedures    Cath: 12/16/19   Prox LAD lesion is 70% stenosed.  LV end diastolic pressure is moderately elevated.  Hemodynamic findings consistent with moderate pulmonary hypertension.  1. Single vessel borderline obstructive disease involving the proximal LAD.  2. Moderately elevated LV filling pressures 3. Moderate pulmonary HTN 4. Preserved cardiac output with index 2.41.   Plan: would focus on optimizing medical therapy. He needs IV diuresis. Will switch lisinopril to losartan with the idea of switching to Entresto in 72 hours. Continue Toprol XL. Consider addition of aldactone. The degree of LV dysfunction is out of proportion to his CAD. If he does have anginal symptoms after optimizing medical therapy we could consider reevaluation of the proximal LAD for stenting. Consider EP evaluation of wide complex tachycardia.   Diagnostic Dominance: Right   _____________   Echo: (see paper chart) at [facility] EF 25-30%    History of Present Illness     Charles Holmes is a 54 y.o. male with HTN and GERD presented to [facility] with chest pain found to have a reduced EF with an episode of VT. He was transferred to [facility] for LHC.   Charles Holmes is a 54 yo male with PMH noted above. He has not been seen by cardiology in the past. Presented to [facility] with chest pain.  Reported over the  last several weeks he has had increasing fatigue and dyspnea with activity. Reports he recently completed treatment for PNA. The morning of admission he was getting ready and loading furniture into a work truck whenever he started having burning pain in his chest.  No associated shortness of breath or nausea.  Pain continued through the morning and he decided to come home from work.  On the way home he checked his HR with his pulse ox and noted his heart rate was in the 150s.  Ultimately presented to the ED for further evaluation was found to have a wide-complex tachycardia.  He was given diltiazem without conversion, then given IV metoprolol and heart rate improved.  Troponin was noted at 0.18.  Cardiology was consulted who recommended admission with echocardiogram.  Echocardiogram showed EF of 25 to 30% impaired diastolic filling, mild aortic regurg, mild mitral regurgitation and tricuspid regurgitation.  Of note troponin I peaked at 1.19.  Decision made to transfer to [facility] for further management with cardiac catheterization.   Hospital Course     Consultants: EP  NSTEMI CAD Heart cath revealed 70% proximal LAD borderline obstructive disease, no intervention. Moderately elevated LV filling pressure and moderate pulmonary HTN. Cardiac output preserved: 2.41. If he continues to have chest pain, may consider intervention. GDMT titrated. Continue ASA, statin, BB.    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 EF is 25-30% (echo at [place]). Medications have been titrated. Losartan and lasix were held yesterday for bump in sCr (0.94 --> 1.34 --> 1.03) . Renal function improved today. Will discharge on low does  losartan and low dose spiro. Can hopefully transition to [facility] outpatient. Check a BMP on Mon/Tues. Charles Holmes has already checked out of pocket costs for [employer] and farxiga - co-pays $0-10 (note in Epic). Charles Holmes was started prior to discharge.    VT Wide  complex tachycardia Pt converted to VT at [facility] and placed on cardizem gtt without change. He converted to sinus rhythm with IV lopressor. EP was consulted and reviewed strips and telemetry. They have recommended a cardiac MRI, which was completed today. They have also recommended discharge with a LifeVest. Plan medical therapy including repeat echo on 3 months to evaluate improvement in function.    Hypertension Medications as above.    Hyperlipidemia with LDL goal < 70 12/18/2019: Cholesterol 237; HDL 35; LDL Cholesterol 178; Triglycerides 119; VLDL 24 Continue 80 mg lipitor. Recheck labs in 6-8 weeks.     Did the patient have an acute coronary syndrome (MI, NSTEMI, STEMI, etc) this admission?:  Yes                               AHA/ACC Clinical Performance & Quality Measures: 1. Aspirin prescribed? - Yes 2. ADP Receptor Inhibitor (Plavix/Clopidogrel, Brilinta/Ticagrelor or Effient/Prasugrel) prescribed (includes medically managed patients)? - No - no intervention 3. Beta Blocker prescribed? - Yes 4. High Intensity Statin (Lipitor 40-80mg  or Crestor 20-40mg ) prescribed? - Yes 5. EF assessed during THIS hospitalization? - No - prior to arrival 6. For EF <40%, was ACEI/ARB prescribed? - Yes 7. For EF <40%, Aldosterone Antagonist (Spironolactone or Eplerenone) prescribed? - Not Applicable (EF >/= 40%) 8. Cardiac Rehab Phase II ordered (including medically managed patients)? - Yes       _____________  Discharge Vitals Blood pressure 109/86, pulse (!) 56, temperature (!) 97.3 F (36.3 C), temperature source Oral, resp. rate 18, height 5\' 8"  (1.727 m), weight 78.4 kg, SpO2 96 %.  Filed Weights   12/17/19 0447 12/18/19 0439 12/19/19 0532  Weight: 78.7 kg 78.3 kg 78.4 kg    Labs & Radiologic Studies    CBC Recent Labs    12/16/19 1821  WBC 7.0  HGB 17.7*  HCT 55.3*  MCV 99.1  PLT 173   Basic Metabolic Panel Recent Labs    12/18/19 0147 12/18/19 0754  12/19/19 0231  NA 140  --  142  K 3.9  --  3.9  CL 98  --  102  CO2 27  --  30  GLUCOSE 106*  --  94  BUN 27*  --  23*  CREATININE 1.34*  --  1.03  CALCIUM 9.4  --  9.1  MG 2.3 2.2  --    Liver Function Tests Recent Labs    12/18/19 0147  AST 42*  ALT 38  ALKPHOS 49  BILITOT 1.7*  PROT 6.3*  ALBUMIN 3.6   No results for input(s): LIPASE, AMYLASE in the last 72 hours. High Sensitivity Troponin:   No results for input(s): TROPONINIHS in the last 720 hours.  BNP Invalid input(s): POCBNP D-Dimer No results for input(s): DDIMER in the last 72 hours. Hemoglobin A1C Recent Labs    12/18/19 0147  HGBA1C 5.1   Fasting Lipid Panel Recent Labs    12/18/19 0147  CHOL 237*  HDL 35*  LDLCALC 178*  TRIG 119  CHOLHDL 6.8   Thyroid Function Tests No results for input(s): TSH, T4TOTAL, T3FREE, THYROIDAB in the last 72 hours.  Invalid input(s): FREET3  _____________  CARDIAC CATHETERIZATION  Result Date: 12/16/2019  Prox LAD lesion is 70% stenosed.  LV end diastolic pressure is moderately elevated.  Hemodynamic findings consistent with moderate pulmonary hypertension.  1. Single vessel borderline obstructive disease involving the proximal LAD. 2. Moderately elevated LV filling pressures 3. Moderate pulmonary HTN 4. Preserved cardiac output with index 2.41. Plan: would focus on optimizing medical therapy. He needs IV diuresis. Will switch lisinopril to losartan with the idea of switching to Entresto in 72 hours. Continue Toprol XL. Consider addition of aldactone. The degree of LV dysfunction is out of proportion to his CAD. If he does have anginal symptoms after optimizing medical therapy we could consider reevaluation of the proximal LAD for stenting. Consider EP evaluation of wide complex tachycardia.   Disposition   Pt is being discharged home today in good condition.  Follow-up Plans & Appointments     Follow-up Information    Regan Lemming, MD Follow up.    Specialty: Cardiology Why: 03/22/2020 @ 9:45AM Contact information: [address] [place] [address] [phone]        Garwin Brothers, MD Follow up on 12/25/2019.   Specialty: Cardiology Why: 2:40 pm Contact information: [address] [place] [address] [phone]              Discharge Instructions    Diet - low sodium heart healthy   Complete by: As directed    Discharge instructions   Complete by: As directed    You may NOT drive while wearing the Life Vest. No lifting over 5 lbs for 1 week. No sexual activity for 1 week. Keep procedure site clean & dry. If you notice increased pain, swelling, bleeding or pus, call/return!  You may shower, but no soaking baths/hot tubs/pools for 1 week.   Increase activity slowly   Complete by: As directed       Discharge Medications   Allergies as of 12/19/2019      Reactions   Percocet [oxycodone-acetaminophen] Itching      Medication List    STOP taking these medications   BC HEADACHE POWDER PO   diclofenac 75 MG EC tablet Commonly known as: VOLTAREN   lisinopril 10 MG tablet Commonly known as: ZESTRIL     TAKE these medications   aspirin 81 MG EC tablet Take 1 tablet (81 mg total) by mouth daily.   atorvastatin 80 MG tablet Commonly known as: LIPITOR Take 1 tablet (80 mg total) by mouth daily.   dapagliflozin propanediol 10 MG Tabs tablet Commonly known as: Farxiga Take 1 tablet (10 mg total) by mouth daily.   losartan 25 MG tablet Commonly known as: COZAAR Take 1 tablet (25 mg total) by mouth daily.   metoprolol succinate 50 MG 24 hr tablet Commonly known as: TOPROL-XL Take 1 tablet (50 mg total) by mouth daily. Take with or immediately following a meal.   One-A-Day Mens 50+ Advantage Tabs Take 1 tablet by mouth daily with breakfast.   pantoprazole 40 MG tablet Commonly known as: PROTONIX Take 40 mg by mouth daily before breakfast.   spironolactone 25 MG tablet Commonly known as:  ALDACTONE Take 0.5 tablets (12.5 mg total) by mouth daily.   Ventolin HFA 108 (90 Base) MCG/ACT inhaler Generic drug: albuterol Inhale 2 puffs into the lungs every 6 (six) hours as needed for wheezing or shortness of breath.          Outstanding Labs/Studies   BMP on Monday  Lipids in  6-8 weeks  Echo in 3 months  LifeVest x 3 months  Duration of Discharge Encounter   Greater than 30 minutes including physician time.  Signed, Roe Rutherford Duke, PA 12/19/2019, 4:06 PM   I have examined the patient and reviewed assessment and plan and discussed with patient.  Agree with above as stated.  Patient with VT.  Now on medical therapy for LV dysfunction.  D/C home with life vest.  COunseled to wear the vest as much as possible, even while asleep.  High dose statin for hyperlipidemia.  He will f/u in [place].  Lance Muss

## 2019-12-19 NOTE — Progress Notes (Signed)
Heart Failure Stewardship Pharmacist Progress Note   PCP: [name], PA-C PCP-Cardiologist: Garwin Brothers, MD    HPI:  54 yo M with PMH of HTN and GERD. He presented to the ED with chest pain and was found to have reduced EF (25-30%), and episode of VT. He was then transferred to [facility] for further management. L/RHC done 12/16/19 and was found to have single vessel borderline obstructive disease of prox LAD with moderately elevated filling pressure, moderate pulm HTN, and cardiac index 2.41.  Current HF Medications: Metoprolol XL 50 mg daily Losartan 25 mg daily Spironolactone 12.5 mg daily Farxiga 10 mg daily  Prior to admission HF Medications: Lisinopril 10 mg daily   Pertinent Lab Values: . Serum creatinine 1.03, BUN 23, Potassium 3.9, Sodium 142, Magnesium 2.2   Vital Signs: . Weight: 172 lbs (admission weight: 173 lbs) . Blood pressure: 100-120/70s  . Heart rate: 60s   Medication Assistance / Insurance Benefits Check: Does the patient have prescription insurance?  Yes Type of insurance plan: commercial plan - patient can utilize monthly copay cards to bring brand name medication cost down to $0-10 per month  Outpatient Pharmacy:  Prior to admission [facility] Is the patient willing to use [facility] TOC pharmacy at discharge? Yes Is the patient willing to transition their outpatient pharmacy to utilize a [facility] outpatient pharmacy?   Pending    Assessment: 1. Acute systolic CHF (EF 03-54%), due to ICM. NYHA class II symptoms. Pending cMRI. - Continue metoprolol XL 25 mg daily - Agree with resuming low dose losartan 25 mg daily today - SCr has improved. Plan to transition to [facility] as outpatient.  - Continue spironolactone 12.5 mg daily - consider increasing to target 25 mg daily pending SCr trends and BP after restarting losartan - Continue Farxiga 10 mg daily   Plan: 1) Medication changes recommended at this time: - Agree with adding losartan and  [place] today as noted above  2) Patient assistance: Test claims completed for the following medications: - Entresto: $20 per month - Farxiga $15 per month Patient is eligible to use copay cards to bring monthly copays from $0-$10 per month   3)  Education  - To be completed prior to discharge  Sharen Hones, PharmD, BCPS Heart Failure Engineer, building services Phone [phone]

## 2019-12-22 ENCOUNTER — Telehealth: Payer: Self-pay | Admitting: Cardiology

## 2019-12-22 ENCOUNTER — Telehealth: Payer: Self-pay

## 2019-12-22 DIAGNOSIS — R9389 Abnormal findings on diagnostic imaging of other specified body structures: Secondary | ICD-10-CM

## 2019-12-22 NOTE — Telephone Encounter (Signed)
Full VM 

## 2019-12-22 NOTE — Telephone Encounter (Signed)
New Message:    Pt says he needs lab work. He is scheduled to see Dr Tomie China on 12-25-19. He wants to know if he should get his lab work before his appt or on the day of his appt?

## 2019-12-22 NOTE — Telephone Encounter (Signed)
Follow Up:     Pt is returning your call. 

## 2019-12-22 NOTE — Telephone Encounter (Signed)
Advised that no additional labs are needed.

## 2019-12-24 NOTE — Telephone Encounter (Signed)
Informed patient of results and verbal understanding expressed. Aware office will call to arrange  Pt reports that he is currently wearing a life vest and he isn't sure he can work and wear this, it is also quite uncomfortable. 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  He is crawling around on the floor on hands/knees, and isn't sure the "box" isn't going to be a problem/in the way. Aware he will most likely be wearing vest for at least 3 months, but will discuss w/ Dr. Elberta Fortis and let him know by next week. Patient verbalized understanding and agreeable to plan.

## 2019-12-24 NOTE — Telephone Encounter (Signed)
-----   Message from Will Jorja Loa, MD sent at 12/23/2019  9:39 AM EST ----- Needs high resolution CT lungs

## 2019-12-25 ENCOUNTER — Ambulatory Visit: Payer: BLUE CROSS/BLUE SHIELD | Admitting: Cardiology

## 2019-12-30 ENCOUNTER — Ambulatory Visit (INDEPENDENT_AMBULATORY_CARE_PROVIDER_SITE_OTHER)
Admission: RE | Admit: 2019-12-30 | Discharge: 2019-12-30 | Disposition: A | Discharge status: Home / Self Care | Payer: BLUE CROSS/BLUE SHIELD | Source: Ambulatory Visit | Attending: Cardiology | Admitting: Cardiology

## 2019-12-30 ENCOUNTER — Other Ambulatory Visit: Payer: Self-pay

## 2019-12-30 DIAGNOSIS — R9389 Abnormal findings on diagnostic imaging of other specified body structures: Secondary | ICD-10-CM

## 2019-12-31 ENCOUNTER — Telehealth: Payer: Self-pay | Admitting: Cardiology

## 2019-12-31 NOTE — Telephone Encounter (Signed)
Patient calling to get results.

## 2019-12-31 NOTE — Telephone Encounter (Signed)
Patient made aware of CT results.   Patient states he is planning on going back to work tomorrow and wanted to let [name] know as they had previously discussed him working while wearing the life vest. He would like a call back from [place] to discuss.   Advised patient she was out of office but will call back at her earliest convenience later this week. He is agreeable to plan.

## 2020-01-01 NOTE — Telephone Encounter (Addendum)
Returned pt call. Slept w/o life-vest last night for about 5 hours, issues w/ antiacid -- but he is wearing it now. Asks if he still needs to see [facility] or [facility] being that CT did not show sarcoidosis.  Aware he will still need to follow up in the [facility] as he is wearing a lifevest.  Aware I will forward this message as reminder he needs an appt.  Pt also asks about the "hotspot the lady gave me, do I carry it everywhere with me?".  Pt advised that he does NOT need to carry the hotspot device given to him, explained that it should be next to his monitor at the house and that the hotspot is there to transmit data every night.  Patient verbalized understanding and agreeable to plan.

## 2020-01-19 ENCOUNTER — Telehealth: Payer: Self-pay | Admitting: Cardiology

## 2020-01-19 NOTE — Telephone Encounter (Signed)
Returned call, pt informed me that he just got off the phone with [facility] and arranged to see them 1/18. He appreciates my follow up call.

## 2020-01-19 NOTE — Telephone Encounter (Signed)
    Pt is calling to follow up an appt to the heart failure clinic. He said per Roanna Raider he will have an appt there but no one called about it and it's been 2 weeks

## 2020-02-02 NOTE — Progress Notes (Signed)
  Patient in [facility] upon arrival and taken to ED. Please see ED consult note.   Arvilla Meres, MD  10:54 PM

## 2020-02-03 ENCOUNTER — Other Ambulatory Visit: Payer: Self-pay

## 2020-02-03 ENCOUNTER — Emergency Department (HOSPITAL_COMMUNITY): Payer: BLUE CROSS/BLUE SHIELD

## 2020-02-03 ENCOUNTER — Emergency Department (HOSPITAL_COMMUNITY)
Admission: EM | Admit: 2020-02-03 | Discharge: 2020-02-03 | Disposition: A | Discharge status: Home / Self Care | Payer: BLUE CROSS/BLUE SHIELD | Attending: Emergency Medicine | Admitting: Emergency Medicine

## 2020-02-03 ENCOUNTER — Ambulatory Visit (HOSPITAL_COMMUNITY)
Admission: RE | Admit: 2020-02-03 | Discharge: 2020-02-03 | Disposition: A | Discharge status: Home / Self Care | Payer: BLUE CROSS/BLUE SHIELD | Source: Ambulatory Visit | Attending: Internal Medicine | Admitting: Internal Medicine

## 2020-02-03 ENCOUNTER — Encounter (HOSPITAL_COMMUNITY): Payer: Self-pay | Admitting: Internal Medicine

## 2020-02-03 VITALS — BP 104/70 | HR 152

## 2020-02-03 DIAGNOSIS — I5022 Chronic systolic (congestive) heart failure: Secondary | ICD-10-CM

## 2020-02-03 DIAGNOSIS — I2510 Atherosclerotic heart disease of native coronary artery without angina pectoris: Secondary | ICD-10-CM | POA: Diagnosis not present

## 2020-02-03 DIAGNOSIS — I472 Ventricular tachycardia, unspecified: Secondary | ICD-10-CM

## 2020-02-03 DIAGNOSIS — I471 Supraventricular tachycardia: Secondary | ICD-10-CM | POA: Insufficient documentation

## 2020-02-03 DIAGNOSIS — D8685 Sarcoid myocarditis: Secondary | ICD-10-CM | POA: Diagnosis not present

## 2020-02-03 DIAGNOSIS — I5021 Acute systolic (congestive) heart failure: Secondary | ICD-10-CM | POA: Insufficient documentation

## 2020-02-03 DIAGNOSIS — I110 Hypertensive heart disease with heart failure: Secondary | ICD-10-CM | POA: Diagnosis not present

## 2020-02-03 DIAGNOSIS — I5023 Acute on chronic systolic (congestive) heart failure: Secondary | ICD-10-CM | POA: Insufficient documentation

## 2020-02-03 DIAGNOSIS — Z7982 Long term (current) use of aspirin: Secondary | ICD-10-CM | POA: Insufficient documentation

## 2020-02-03 DIAGNOSIS — Z87891 Personal history of nicotine dependence: Secondary | ICD-10-CM | POA: Insufficient documentation

## 2020-02-03 DIAGNOSIS — Z79899 Other long term (current) drug therapy: Secondary | ICD-10-CM | POA: Insufficient documentation

## 2020-02-03 LAB — BRAIN NATRIURETIC PEPTIDE: B Natriuretic Peptide: 540.6 pg/mL — ABNORMAL HIGH (ref 0.0–100.0)

## 2020-02-03 LAB — CBC WITH DIFFERENTIAL/PLATELET
Abs Immature Granulocytes: 0.02 10*3/uL (ref 0.00–0.07)
Basophils Absolute: 0 10*3/uL (ref 0.0–0.1)
Basophils Relative: 0 %
Eosinophils Absolute: 0.1 10*3/uL (ref 0.0–0.5)
Eosinophils Relative: 1 %
HCT: 52.1 % — ABNORMAL HIGH (ref 39.0–52.0)
Hemoglobin: 16.4 g/dL (ref 13.0–17.0)
Immature Granulocytes: 0 %
Lymphocytes Relative: 16 %
Lymphs Abs: 1.2 10*3/uL (ref 0.7–4.0)
MCH: 31.5 pg (ref 26.0–34.0)
MCHC: 31.5 g/dL (ref 30.0–36.0)
MCV: 100.2 fL — ABNORMAL HIGH (ref 80.0–100.0)
Monocytes Absolute: 0.8 10*3/uL (ref 0.1–1.0)
Monocytes Relative: 11 %
Neutro Abs: 5.5 10*3/uL (ref 1.7–7.7)
Neutrophils Relative %: 72 %
Platelets: 232 10*3/uL (ref 150–400)
RBC: 5.2 MIL/uL (ref 4.22–5.81)
RDW: 11.9 % (ref 11.5–15.5)
WBC: 7.6 10*3/uL (ref 4.0–10.5)
nRBC: 0 % (ref 0.0–0.2)

## 2020-02-03 LAB — BASIC METABOLIC PANEL
Anion gap: 10 (ref 5–15)
BUN: 12 mg/dL (ref 6–20)
CO2: 27 mmol/L (ref 22–32)
Calcium: 9.4 mg/dL (ref 8.9–10.3)
Chloride: 105 mmol/L (ref 98–111)
Creatinine, Ser: 1.05 mg/dL (ref 0.61–1.24)
GFR, Estimated: >60 mL/min (ref >60)
Glucose, Bld: 99 mg/dL (ref 70–99)
Potassium: 4.4 mmol/L (ref 3.5–5.1)
Sodium: 142 mmol/L (ref 135–145)

## 2020-02-03 LAB — I-STAT CHEM 8, ED
BUN: 15 mg/dL (ref 6–20)
Calcium, Ion: 1.14 mmol/L — ABNORMAL LOW (ref 1.15–1.40)
Chloride: 102 mmol/L (ref 98–111)
Creatinine, Ser: 1.1 mg/dL (ref 0.61–1.24)
Glucose, Bld: 100 mg/dL — ABNORMAL HIGH (ref 70–99)
HCT: 49 % (ref 39.0–52.0)
Hemoglobin: 16.7 g/dL (ref 13.0–17.0)
Potassium: 4.3 mmol/L (ref 3.5–5.1)
Sodium: 141 mmol/L (ref 135–145)
TCO2: 31 mmol/L (ref 22–32)

## 2020-02-03 LAB — TROPONIN I (HIGH SENSITIVITY)
Troponin I (High Sensitivity): 49 ng/L — ABNORMAL HIGH (ref <18)
Troponin I (High Sensitivity): 79 ng/L — ABNORMAL HIGH (ref <18)

## 2020-02-03 LAB — MAGNESIUM: Magnesium: 2.1 mg/dL (ref 1.7–2.4)

## 2020-02-03 MED ORDER — AMIODARONE HCL IN DEXTROSE 360-4.14 MG/200ML-% IV SOLN: 30.0000 mg/h | INTRAVENOUS | Status: DC

## 2020-02-03 MED ORDER — AMIODARONE LOAD VIA INFUSION
150.0000 mg | Freq: Once | INTRAVENOUS | Status: AC
  Administered 2020-02-03: 150 mg via INTRAVENOUS
  Filled 2020-02-03: qty 83.34

## 2020-02-03 MED ORDER — METOPROLOL SUCCINATE ER 100 MG PO TB24
50.0000 mg | ORAL_TABLET | Freq: Every day | ORAL | 1 refills | Status: DC
Start: 2020-02-03 — End: 2020-02-21

## 2020-02-03 MED ORDER — AMIODARONE HCL IN DEXTROSE 360-4.14 MG/200ML-% IV SOLN
60.0000 mg/h | INTRAVENOUS | Status: DC
  Administered 2020-02-03: 60 mg/h via INTRAVENOUS

## 2020-02-03 MED ORDER — APIXABAN 5 MG PO TABS: 5.0000 mg | ORAL_TABLET | Freq: Two times a day (BID) | ORAL | 1 refills | Status: DC

## 2020-02-03 MED ORDER — APIXABAN 5 MG PO TABS
5.0000 mg | ORAL_TABLET | Freq: Two times a day (BID) | ORAL | Status: DC
  Administered 2020-02-03: 5 mg via ORAL
  Filled 2020-02-03: qty 1

## 2020-02-03 NOTE — Consult Note (Addendum)
Advanced Heart Failure Team Consult Note   Primary Physician: [name], PA-C PCP-Cardiologist:  Garwin Brothers, MD  Reason for Consultation: HF/VT   HPI:    Charles Holmes is seen today for evaluation of HF/VT  at the request of Dr Elberta Fortis.   Mr Parlette is a 55 year old with history of HTN, GERD, systolic HF due to NICM and VT.   He presented to [facility] on 12/16/19 with CP. Found to have VT and  EF 25-30% mild AI, MR and TR. Placed on cardizem gtt without change. He converted to sinus rhythm with IV lopressor. Transferred to [facility] for further management. Had cath that showed 70% prox LAD with no intervention.  He was discharged with Life Vest.   Today he presented to the HF clinic as a new patient. Says he took his Life Vest off around 1300 to shower. When he got out of the showed his pulse was >150. He put his Life Vest back on and says it has been alarming since that time. He has been silencing the alarm. He was able to walk in the clinic. Denies dizziness/presyncope. EKG in the HF clnic- WCT 152 bpm. Taking all medications.   Taken to the ED from the clinic with Rapid Response. On arrival to ED he was in NSR. No chest pain. EP at bedside. BP 98/62.   Cath 12/16/19 - LAD 70%prox. Otherwise normal cors  cMRI 12/19/19 1. LVEF 18% Diffuse hypokinesis with thinning/akinesis of basal inferoseptum, basal to mid anterior, mid anteroseptum, mid inferolateral, and apical septum 2. There is subendocardial late gadolinium enhancement suggestive of prior infarct, however does not occur in a coronary distribution, as there is circumferential subendocardial LGE at the base along with the mid inferoseptum/anteroseptum/anterior walls and apical septum. In addition there is nonischemic scar pattern with subepicardial LGE in the mid anterior and mid inferior walls. There is also LGE of the papillary muscles. 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 3. Normal RV size with moderate systolic dysfunction (EF 38%)   High Res CT 12/30/19: Not suggestive of pulmonary sarcoid   Review of Systems: [y] = yes, [ ]  = no   . General: Weight gain [ ] ; Weight loss [ ] ; Anorexia [ ] ; Fatigue [ ] ; Fever [ ] ; Chills [ ] ; Weakness [ ]   . Cardiac: Chest pain/pressure [ ] ; Resting SOB [ ] ; Exertional SOB [place] ]; Orthopnea [ ] ; Pedal Edema [ ] ; Palpitations [ ] ; Syncope [ ] ; Presyncope [ ] ; Paroxysmal nocturnal dyspnea[ ]   . Pulmonary: Cough [ ] ; Wheezing[ ] ; Hemoptysis[ ] ; Sputum [ ] ; Snoring [ ]   . GI: Vomiting[ ] ; Dysphagia[ ] ; Melena[ ] ; Hematochezia [ ] ; Heartburn[ ] ; Abdominal pain [ ] ; Constipation [ ] ; Diarrhea [ ] ; BRBPR [ ]   . GU: Hematuria[ ] ; Dysuria [ ] ; Nocturia[ ]   . Vascular: Pain in legs with walking [ ] ; Pain in feet with lying flat [ ] ; Non-healing sores [ ] ; Stroke [ ] ; TIA [ ] ; Slurred speech [ ] ;  . Neuro: Headaches[ ] ; Vertigo[ ] ; Seizures[ ] ; Paresthesias[ ] ;Blurred vision [ ] ; Diplopia [ ] ; Vision changes [ ]   . Ortho/Skin: Arthritis [ y]; Joint pain [Y ]; Muscle pain [ ] ; Joint swelling [ ] ; Back Pain [Y ]; Rash [ ]   . Psych: Depression[ ] ; Anxiety[ ]   . Heme: Bleeding problems [ ] ; Clotting disorders [ ] ; Anemia [ ]   . Endocrine: Diabetes [ ] ; Thyroid dysfunction[ ]   Home Medications Prior to Admission medications   Medication Sig Start Date End Date Taking? Authorizing Provider  aspirin EC 81 MG EC tablet Take 1 tablet (81 mg total) by mouth daily. 12/19/19   [place], Peter M, MD  atorvastatin (LIPITOR) 80 MG tablet Take 1 tablet (80 mg total) by mouth daily. 12/19/19   [place], Peter M, MD  dapagliflozin propanediol (FARXIGA) 10 MG TABS tablet Take 1 tablet (10 mg total) by mouth daily. 12/19/19   [place], Peter M, MD  losartan (COZAAR) 25 MG tablet Take 1 tablet (25 mg total) by mouth daily. 12/19/19   [place], Peter M, MD  metoprolol succinate (TOPROL-XL) 50 MG 24 hr tablet Take 1 tablet (50 mg total) by mouth daily.  Take with or immediately following a meal. 12/19/19   [place], Peter M, MD  Multiple Vitamins-Minerals (ONE-A-DAY MENS 50+ ADVANTAGE) TABS Take 1 tablet by mouth daily with breakfast.    [provider]  pantoprazole (PROTONIX) 40 MG tablet Take 40 mg by mouth daily before breakfast.    [provider]  spironolactone (ALDACTONE) 25 MG tablet Take 0.5 tablets (12.5 mg total) by mouth daily. 12/19/19   [place], Peter M, MD  VENTOLIN HFA 108 [phone] Base) MCG/ACT inhaler Inhale 2 puffs into the lungs every 6 (six) hours as needed for wheezing or shortness of breath.  08/13/19   [provider]    Past Medical History: Past Medical History:  Diagnosis Date  . GERD (gastroesophageal reflux disease)   . Hypertension     Past Surgical History: Past Surgical History:  Procedure Laterality Date  . RIGHT/LEFT HEART CATH AND CORONARY ANGIOGRAPHY N/A 12/16/2019   Procedure: RIGHT/LEFT HEART CATH AND CORONARY ANGIOGRAPHY;  Surgeon: [place]il[place], Peter M, MD;  Location: [facility] INVASIVE CV LAB;  Service: Cardiovascular;  Laterality: N/A;    Family History: Family History  Problem Relation Age of Onset  . Hypertension Father   . Heart disease Father   . Heart disease Mother     Social History: Social History   Socioeconomic History  . Marital status: Divorced    Spouse name: Not on file  . Number of children: Not on file  . Years of education: Not on file  . Highest education level: Not on file  Occupational History  . Not on file  Tobacco Use  . Smoking status: Former Smoker    Types: Cigarettes  . Smokeless tobacco: Never Used  Vaping Use  . Vaping Use: Unknown  Substance and Sexual Activity  . Alcohol use: Yes    Alcohol/week: 6.0 standard drinks    Types: 6 Cans of beer per week  . Drug use: Never  . Sexual activity: Yes  Other Topics Concern  . Not on file  Social History Narrative  . Not on file   Social Determinants of Health   Financial Resource Strain: Not  on file  Food Insecurity: Not on file  Transportation Needs: Not on file  Physical Activity: Not on file  Stress: Not on file  Social Connections: Not on file    Allergies:  Allergies  Allergen Reactions  . Percocet [Oxycodone-Acetaminophen] Itching    Objective:    Vital Signs:   Temp:  [98.1 F (36.7 C)] 98.1 F (36.7 C) (01/18 1530) Pulse Rate:  [66-67] 67 (01/18 1530) Resp:  [17-21] 21 (01/18 1530) BP: (98)/(62) 98/62 (01/18 1530) SpO2:  [94 %-95 %] 94 % (01/18 1530) Weight:  [81.6 kg] 81.6 kg (01/18 1528)    Weight  change: Filed Weights   02/03/20 1528  Weight: 81.6 kg    Intake/Output:  No intake or output data in the 24 hours ending 02/03/20 1535    Physical Exam    General:   No resp difficulty HEENT: normal Neck: supple. JVP 5-6 . Carotids 2+ bilat; no bruits. No lymphadenopathy or thyromegaly appreciated. Cor: PMI nondisplaced. Tachy regular  No rubs, gallops or murmurs.Life Vest on.  Lungs: clear Abdomen: soft, nontender, nondistended. No hepatosplenomegaly. No bruits or masses. Good bowel sounds. Extremities: no cyanosis, clubbing, rash, edema Neuro: alert & orientedx3, cranial nerves grossly intact. moves all 4 extremities w/o difficulty. Affect pleasant   Telemetry   NSR 60-70s   EKG   SR  65 bpm QRS 159 ms in the ED   WCT in HF clinic 152 bpm   Labs   Basic Metabolic Panel: No results for input(s): NA, K, CL, CO2, GLUCOSE, BUN, CREATININE, CALCIUM, MG, PHOS in the last 168 hours.  Liver Function Tests: No results for input(s): AST, ALT, ALKPHOS, BILITOT, PROT, ALBUMIN in the last 168 hours. No results for input(s): LIPASE, AMYLASE in the last 168 hours. No results for input(s): AMMONIA in the last 168 hours.  CBC: No results for input(s): WBC, NEUTROABS, HGB, HCT, MCV, PLT in the last 168 hours.  Cardiac Enzymes: No results for input(s): CKTOTAL, CKMB, CKMBINDEX, TROPONINI in the last 168 hours.  BNP: BNP (last 3 results) No  results for input(s): BNP in the last 8760 hours.  ProBNP (last 3 results) No results for input(s): PROBNP in the last 8760 hours.   CBG: No results for input(s): GLUCAP in the last 168 hours.  Coagulation Studies: No results for input(s): LABPROT, INR in the last 72 hours.   Imaging    No results found.   Medications:     Current Medications: . amiodarone  150 mg Intravenous Once     Infusions: . amiodarone     Followed by  . amiodarone         Assessment/Plan   1. Recurrent VT - Presented to the HF Clinic with WCT on EKG. . We have asked Zoll Rep to provide interrogation. Unable to perform remotely. She will interrogate in the ED.  - Spontaneously broke  - Check BMET/Mag - Give 150 bolus amio  - EP at the bedside to manage - With possible sarcoid will need to continue Life Vest   2. Chronic Systolic HF Recently diagnosed back in 11/2019. Presented with VT. LHC 70% LAD.  - Echo EF 25-30%  CMRI concerning for sarcoid and EF 18%.   - Will need Cardiac PET to further evaluate and will need prednisone if + for sarcoid.  -Will need to continue Life Vest On exam he does not appear volume overloaded.  - SBP soft in the 90s. Can hold losartan, spiro, farxiga for now.   3. CAD -LHC 12/07/19 70-% LAD -No chest pain  - Continue statin and aspirin.    Length of Stay: 0  Tonye Becket, NP  02/03/2020, 3:35 PM  Advanced Heart Failure Team Pager [phone] (M-F; 7a - 4p)  Please contact [facility] for night-coverage after hours (4p -7a ) and weekends on [email or web address]  Patient seen and examined with the above-signed Advanced Practice Provider and/or Housestaff. I personally reviewed laboratory data, imaging studies and relevant notes. I independently examined the patient and formulated the important aspects of the plan. I have edited the note to reflect any of my changes or salient points.  I have personally discussed the plan with the patient and/or family.  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 Raul Del for further evaluation of HF and cardiac sarcoid. On arrival to clinic today was in recurrent VT at 150. Mildly symptomatic. LifeVest alarming. Sent to ED and spontaneously converted.   AT baseline NYHA II-early III symptoms. Volume status ok   General:  Well appearing. No resp difficulty HEENT: normal Neck: supple. no JVD. Carotids 2+ bilat; no bruits. No lymphadenopathy or thryomegaly appreciated. Cor: PMI nondisplaced. Regular tachyNo rubs, gallops or murmurs. Lungs: clear Abdomen: soft, nontender, nondistended. No hepatosplenomegaly. No bruits or masses. Good bowel sounds. Extremities: no cyanosis, clubbing, rash, edema Neuro: alert & orientedx3, cranial nerves grossly intact. moves all 4 extremities w/o difficulty. Affect pleasant  EP managing VT. Given recurrent VT, I suspect we have enough data to start steroid therapy to help settle VT down. Will start prednisone 30mg  daily and Bactrim for PJP prophylaxis. Will need PET scan at [facility] to further evalaute once stabilized.   [facility], MD  4:32 PM  Addendum:  I spoke with Dr. Arvilla Meres. Plan will be for EP study next week to confirm diagnosis of VT. Will hold on initiating steroids until EPS complete.   Elberta Fortis, MD  5:58 PM

## 2020-02-03 NOTE — ED Provider Notes (Signed)
5:50 PM-patient sitting up alert, calm and comfortable.  Heart rate currently 56.  Blood pressure normal.  Amiodarone drip is off.  The patient has been seen by electrophysiology, Dr. Elberta Fortis, who has set up follow-up care.  He wants the patient on metoprolol XL 100 mg and Eliquis 5 mg twice daily.  In addition to taking his other medicines.  As of now, the magnesium and troponin are still pending.  I contacted lab who states that the tubes are currently stuck in a machine that cannot be retrieved, and they think it might be another hour before they get the tubes out.  Note the patient's potassium is normal.  I have low suspicion for ACS, and hypomagnesemia.  Patient has had just brief episode of tachycardia while in the ED and is otherwise been very low heart rates.  6:00 PM-discussed situation with the patient.  He is very anxious to go home.  He still appears comfortable.  I sent prescriptions to his pharmacy.  Will order dose of Eliquis prior to discharge.     Mancel Bale, MD 02/03/20 2256

## 2020-02-03 NOTE — ED Triage Notes (Signed)
Pt arrived via heart clinic after pt was in active vtach. Pt arrived to ed back in sinus.

## 2020-02-03 NOTE — ED Provider Notes (Addendum)
[facility] EMERGENCY DEPARTMENT Provider Note   CSN: [id number] Arrival date & time: 02/03/20  1516     History Chief Complaint  Patient presents with  . vtach    Charles Holmes is a 55 y.o. male.  HPI   55 year old male with past medical history of cardiac sarcoidosis wearing LifeVest, heart failure presents the emergency department from cardiac clinic for ventricular tachycardia.  Patient states he got out of the shower earlier today.  He had a mild discomfort in his chest.  When he put the LifeVest on it kept alerting.  He Silence in the alert.  He had a scheduled appointment with cardiology today.  When he got there the LifeVest was alerting to they did an EKG and noted that he was in stable ventricular tachycardia.  They were unable to convert him so he was sent here for further care.  Patient had converted to sinus rhythm on arrival and on my initial evaluation.  Currently he has no chest pain, shortness of breath or any complaints.    Past Medical History:  Diagnosis Date  . GERD (gastroesophageal reflux disease)   . Hypertension     Patient Active Problem List   Diagnosis Date Noted  . AKI (acute kidney injury) (HCC) 12/19/2019  . CAD (coronary artery disease) 12/19/2019  . Hyperlipidemia with target LDL less than 70 12/19/2019  . Wide-complex tachycardia (HCC) 12/16/2019  . Acute systolic CHF (congestive heart failure) (HCC) 12/16/2019  . HTN (hypertension) 12/16/2019    Past Surgical History:  Procedure Laterality Date  . RIGHT/LEFT HEART CATH AND CORONARY ANGIOGRAPHY N/A 12/16/2019   Procedure: RIGHT/LEFT HEART CATH AND CORONARY ANGIOGRAPHY;  Surgeon: [place], Peter M, MD;  Location: [facility] INVASIVE CV LAB;  Service: Cardiovascular;  Laterality: N/A;       Family History  Problem Relation Age of Onset  . Hypertension Father   . Heart disease Father   . Heart disease Mother     Social History   Tobacco Use  . Smoking status: Former  Smoker    Types: Cigarettes  . Smokeless tobacco: Never Used  Vaping Use  . Vaping Use: Unknown  Substance Use Topics  . Alcohol use: Yes    Alcohol/week: 6.0 standard drinks    Types: 6 Cans of beer per week  . Drug use: Never    Home Medications Prior to Admission medications   Medication Sig Start Date End Date Taking? Authorizing Provider  aspirin EC 81 MG EC tablet Take 1 tablet (81 mg total) by mouth daily. 12/19/19   [place], Peter M, MD  atorvastatin (LIPITOR) 80 MG tablet Take 1 tablet (80 mg total) by mouth daily. 12/19/19   [place], Peter M, MD  dapagliflozin propanediol (FARXIGA) 10 MG TABS tablet Take 1 tablet (10 mg total) by mouth daily. 12/19/19   [place], Peter M, MD  losartan (COZAAR) 25 MG tablet Take 1 tablet (25 mg total) by mouth daily. 12/19/19   [place], Peter M, MD  metoprolol succinate (TOPROL-XL) 50 MG 24 hr tablet Take 1 tablet (50 mg total) by mouth daily. Take with or immediately following a meal. 12/19/19   [place], Peter M, MD  Multiple Vitamins-Minerals (ONE-A-DAY MENS 50+ ADVANTAGE) TABS Take 1 tablet by mouth daily with breakfast.    [provider]  pantoprazole (PROTONIX) 40 MG tablet Take 40 mg by mouth daily before breakfast.    [provider]  spironolactone (ALDACTONE) 25 MG tablet Take 0.5 tablets (12.5 mg total)  by mouth daily. 12/19/19   [place], Peter M, MD  VENTOLIN HFA 108 [phone] Base) MCG/ACT inhaler Inhale 2 puffs into the lungs every 6 (six) hours as needed for wheezing or shortness of breath.  08/13/19   [provider]    Allergies    Percocet [oxycodone-acetaminophen]  Review of Systems   Review of Systems  Constitutional: Negative for chills and fever.  HENT: Negative for congestion.   Eyes: Negative for visual disturbance.  Respiratory: Negative for shortness of breath.   Cardiovascular: Positive for chest pain and palpitations.  Gastrointestinal: Negative for abdominal pain, diarrhea and vomiting.  Genitourinary:  Negative for dysuria.  Skin: Negative for rash.  Neurological: Negative for headaches.    Physical Exam Updated Vital Signs BP 98/62 (BP Location: Right Arm)   Pulse 67   Temp 98.1 F (36.7 C) (Oral)   Resp (!) 21   Ht 5\' 8"  (1.727 m)   Wt 81.6 kg   SpO2 94%   BMI 27.37 kg/m   Physical Exam Vitals and nursing note reviewed.  Constitutional:      Appearance: Normal appearance.  HENT:     Head: Normocephalic.     Mouth/Throat:     Mouth: Mucous membranes are moist.  Cardiovascular:     Rate and Rhythm: Normal rate.     Comments: LifeVest and ZOLL pads in place Pulmonary:     Effort: Pulmonary effort is normal. No respiratory distress.  Abdominal:     Palpations: Abdomen is soft.     Tenderness: There is no abdominal tenderness.  Skin:    General: Skin is warm.  Neurological:     Mental Status: He is alert and oriented to person, place, and time. Mental status is at baseline.  Psychiatric:        Mood and Affect: Mood normal.     ED Results / Procedures / Treatments   Labs (all labs ordered are listed, but only abnormal results are displayed) Labs Reviewed  CBC WITH DIFFERENTIAL/PLATELET - Abnormal; Notable for the following components:      Result Value   HCT 52.1 (*)    MCV 100.2 (*)    All other components within normal limits  I-STAT CHEM 8, ED - Abnormal; Notable for the following components:   Glucose, Bld 100 (*)    Calcium, Ion 1.14 (*)    All other components within normal limits  SARS CORONAVIRUS 2 (TAT 6-24 HRS)  BASIC METABOLIC PANEL  MAGNESIUM  BRAIN NATRIURETIC PEPTIDE  TROPONIN I (HIGH SENSITIVITY)    EKG None  Radiology DG Chest Port 1 View  Result Date: 02/03/2020 CLINICAL DATA:  Cardiac palpitations, runs of ventricular tachycardia EXAM: PORTABLE CHEST 1 VIEW COMPARISON:  12/30/2019 FINDINGS: Electronic device projecting over the central chest compatible with the patient's Zoll Lifevest. Borderline enlargement of the  cardiopericardial silhouette, without edema. Old healed right rib fractures laterally. No blunting of the costophrenic angles. No additional significant findings. IMPRESSION: 1. Borderline enlargement of the cardiopericardial silhouette, without edema. 2. Old healed right rib fractures. 3. Zoll Lifevest device noted. Electronically Signed   By: 01/01/2020 M.D.   On: 02/03/2020 15:46    Procedures .Critical Care Performed by: 02/05/2020, DO Authorized by: Rozelle Logan, DO   Critical care provider statement:    Critical care time (minutes):  45   Critical care was time spent personally by me on the following activities:  Discussions with consultants, evaluation of patient's response to treatment, examination  of patient, ordering and performing treatments and interventions, ordering and review of laboratory studies, ordering and review of radiographic studies, pulse oximetry, re-evaluation of patient's condition, obtaining history from patient or surrogate and review of old charts   I assumed direction of critical care for this patient from another provider in my specialty: no     (including critical care time)  Medications Ordered in ED Medications  amiodarone (NEXTERONE) 1.8 mg/mL load via infusion 150 mg (150 mg Intravenous Bolus from Bag 02/03/20 1531)    Followed by  amiodarone (NEXTERONE PREMIX) 360-4.14 MG/200ML-% (1.8 mg/mL) IV infusion (60 mg/hr Intravenous New Bag/Given 02/03/20 1623)    Followed by  amiodarone (NEXTERONE PREMIX) 360-4.14 MG/200ML-% (1.8 mg/mL) IV infusion (has no administration in time range)    ED Course  I have reviewed the triage vital signs and the nursing notes.  Pertinent labs & imaging results that were available during my care of the patient were reviewed by me and considered in my medical decision making (see chart for details).    MDM Rules/Calculators/A&P                          55 year old male presents from cardiac clinic with  ventricular tachycardia.  Patient was admitted a couple weeks ago with a similar presentation, seen by cardiology.  He is wearing a LifeVest.  At the clinic he was noted to be in stable V. tach.  On arrival here he had converted to sinus rhythm.  Cardiology at bedside.  Bolus of amiodarone given..  Patient sitting up, conversational, stable vitals and asymptomatic.  Blood work is reassuring with normal electrolytes, magnesium and troponin is pending.  Cardiology will admit the patient.  Electrophysiology stopped by, they recommend increasing his metoprolol to 100 mg as well as adding Eliquis 5 mg twice daily and the possibility that this is atrial flutter. Patient is still pending magnesium and troponin.  Final Clinical Impression(s) / ED Diagnoses Final diagnoses:  Ventricular tachycardia [facility])    Rx / DC Orders ED Discharge Orders    None       Rozelle Logan, DO 02/03/20 1640    Keath Matera, Clabe Seal, DO 02/03/20 1658    Lorry Anastasi, Clabe Seal, DO 02/03/20 1701

## 2020-02-03 NOTE — Discharge Instructions (Addendum)
Call your cardiologist, to make arrangements for the electrophysiology study to be done next Wednesday.  Start the new prescriptions, tomorrow morning.

## 2020-02-03 NOTE — Progress Notes (Signed)
Pt arrived to clinic for scheduled appt with Zoll Lifevest alarming. He states it has been doing that off/on since about 11 am, he is asymptomatic so he just has been hitting the silence button. EKG revealed VT with rates in the 150s, Dr Gala Romney at bedside and tried to break rhythm but unsuccessful. Rapid Response called and pt transported to ER

## 2020-02-03 NOTE — Consult Note (Addendum)
Cardiology Consultation:   Patient ID: Wil Blee MRN: [id number]; DOB: [birth year]  Admit date: 02/03/2020 Date of Consult: 02/03/2020  Primary Care Provider: [name], PA-C [facility] Cardiologist: Garwin Brothers, MD  AHF: Dr. Lenoria [facility] Electrophysiologist:  None Dr. Elberta Fortis   Patient Profile:   Dung Coulbourn is a 55 y.o. 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 Bensimhon.  History of Present Illness:   Mr. Pridmore was recently chest discomfort.  He reported a few weeks of progressive fatigue and SOB/DOE and had been recently treated for a PNA. 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   AT [facility] noted in a WCT, he was initially given diltiazem without change, followed by IV lopressor that seemed to convert him to SR,  He was seen in consult by Dr. Tomie China, his consult note mentions discussing the case with Dr. Ladona Ridgel regarding VT and was recommended ischemic eval/cath, and he was transferred to [facility], his echo from [facility] noted LVEF 25-30%, no significant VHD.  He underwent cath noting  Prox LAD lesion is 70% stenosed. LV end diastolic pressure is moderately elevated. Hemodynamic findings consistent with moderate pulmonary hypertension.   1. Single vessel borderline obstructive disease involving the proximal LAD.  2. Moderately elevated LV filling pressures 3. Moderate pulmonary HTN 4. Preserved cardiac output with index 2.41.  Plan: would focus on optimizing medical therapy. He needs IV diuresis. Zaela Graley switch lisinopril to losartan with the idea of switching to Entresto in 72 hours. Continue Toprol XL. Consider addition of aldactone. The degree of LV dysfunction is out of  proportion to his CAD. If he does have anginal symptoms after optimizing medical therapy we could consider reevaluation of the proximal LAD for stenting C.MRI was planned, EP consulted with recommendations for GDMT, life vest and outpt follow up on his c.MRI results and discharged 12/19/19.  His c/MRI was abnormal and referred to Dr. Gala Romney and AHF team.  Today  The patient was at the HF clinic for his consult with Dr. Gala Romney and reported that prior to coming in his lofe vest kept alarming, he was feeling OK so he aborted shocks manually. EKG was done and he was in VT 150's, without symptoms. He was brought from the clinic to the ER, EP team alerted and asked to see for recommendations.  Upon arrival to the ER and placed on telemetry he had self converted to SR 60's.  LABS  Ordered and drawn  The patient has felt well since his discharge, has been back to work Multimedia programmer) and denies any kind of CP, no exertional intolerances. He denies any missed medicines, particularly his Toprol. NO dizzy spells, near syncope or syncope. This is the first time the life vest has alarmed, has never shocked him. He says he slept in today and that was unusual for him, though once up and around felt well.  He took the life vest off to shower and about the time he got out of the shower her felt a little "off" and checked his pulse, the polse Ox read 158bpm, so he laced the life vest on and it alarmed  Almost as soon as he put it on.  He denies any symptoms of illness, no fever, no sick contacts.  Feeling OK, he aborted the shocks each time it alarmed and proceeded to his visit with Dr.Bensimhon.  He feels well in the ER, maintaining SR currently    Past Medical History:  Diagnosis Date   GERD (gastroesophageal reflux disease)    Hypertension     Past Surgical History:  Procedure Laterality Date   RIGHT/LEFT HEART CATH AND CORONARY ANGIOGRAPHY N/A 12/16/2019   Procedure: RIGHT/LEFT  HEART CATH AND CORONARY ANGIOGRAPHY;  Surgeon: [place], Peter M, MD;  Location: [facility] INVASIVE CV LAB;  Service: Cardiovascular;  Laterality: N/A;     Home Medications:  Prior to Admission medications   Medication Sig Start Date End Date Taking? Authorizing Provider  aspirin EC 81 MG EC tablet Take 1 tablet (81 mg total) by mouth daily. 12/19/19   [place], Peter M, MD  atorvastatin (LIPITOR) 80 MG tablet Take 1 tablet (80 mg total) by mouth daily. 12/19/19   [place], Peter M, MD  dapagliflozin propanediol (FARXIGA) 10 MG TABS tablet Take 1 tablet (10 mg total) by mouth daily. 12/19/19   [place], Peter M, MD  losartan (COZAAR) 25 MG tablet Take 1 tablet (25 mg total) by mouth daily. 12/19/19   [place], Peter M, MD  metoprolol succinate (TOPROL-XL) 50 MG 24 hr tablet Take 1 tablet (50 mg total) by mouth daily. Take with or immediately following a meal. 12/19/19   [place], Peter M, MD  Multiple Vitamins-Minerals (ONE-A-DAY MENS 50+ ADVANTAGE) TABS Take 1 tablet by mouth daily with breakfast.    [provider]  pantoprazole (PROTONIX) 40 MG tablet Take 40 mg by mouth daily before breakfast.    [provider]  spironolactone (ALDACTONE) 25 MG tablet Take 0.5 tablets (12.5 mg total) by mouth daily. 12/19/19   [place], Peter M, MD  VENTOLIN HFA 108 [phone] Base) MCG/ACT inhaler Inhale 2 puffs into the lungs every 6 (six) hours as needed for wheezing or shortness of breath.  08/13/19   [provider]    Inpatient Medications: Scheduled Meds:  amiodarone  150 mg Intravenous Once   Continuous Infusions:  amiodarone     Followed by   amiodarone     PRN Meds:   Allergies:    Allergies  Allergen Reactions   Percocet [Oxycodone-Acetaminophen] Itching    Social History:   Social History   Socioeconomic History   Marital status: Divorced    Spouse name: Not on file   Number of children: Not on file   Years of education: Not on file   Highest education level: Not on file   Occupational History   Not on file  Tobacco Use   Smoking status: Former Smoker    Types: Cigarettes   Smokeless tobacco: Never Used  Vaping Use   Vaping Use: Unknown  Substance and Sexual Activity   Alcohol use: Yes    Alcohol/week: 6.0 standard drinks    Types: 6 Cans of beer per week   Drug use: Never   Sexual activity: Yes  Other Topics Concern   Not on file  Social History Narrative   Not on file   Social Determinants of Health   Financial Resource Strain: Not on file  Food Insecurity: Not on file  Transportation Needs: Not on file  Physical Activity: Not on file  Stress: Not on file  Social Connections: Not on file  Intimate Partner Violence: Not on file    Family History:   Family History  Problem Relation Age of Onset   Hypertension Father    Heart disease Father    Heart disease Mother      ROS:  Please see the history of present illness.  All other ROS reviewed and negative.     Physical Exam/Data:   Vitals:   02/03/20 1528 02/03/20 1529 02/03/20 1530  BP:   98/62  Pulse:  66 67  Resp:  17 (!) 21  Temp:   98.1 F (36.7 C)  TempSrc:   Oral  SpO2:  95% 94%  Weight: 81.6 kg    Height: 5\' 8"  (1.727 m)     No intake or output data in the 24 hours ending 02/03/20 1532 Last 3 Weights 02/03/2020 12/19/2019 12/18/2019  Weight (lbs) 180 lb 172 lb 13.5 oz 172 lb 9.9 oz  Weight (kg) 81.647 kg 78.4 kg 78.3 kg     Body mass index is 27.37 kg/m.  General:  Well nourished, well developed, in no acute distress HEENT: normal Lymph: no adenopathy Neck: no JVD Endocrine:  No thryomegaly Vascular: No carotid bruits  Cardiac:  RRR; no murmurs, gallops or rubs Lungs:  CTA b/l, no wheezing, rhonchi or rales  Abd: soft, nontender Ext: no edema Musculoskeletal:  No deformities Skin: warm and dry  Neuro:  no focal abnormalities noted Psych:  Normal affect   EKG:  The EKG was personally reviewed and demonstrates:   WCT 152bpm, there is an underlying atrial  rhythm, and wonder if 2:1 flutter, though favor VT #2 is sinus vs ectopic atrial rhythm, RBBB, leftward axis  Old 12/17/19 SR  63bpm, RBBB, LAD  Telemetry:  Telemetry was personally reviewed and demonstrates:   SR 60's  Relevant CV Studies:   12/30/19: High res CT IMPRESSION: 1. Diffuse bilateral bronchial wall thickening, consistent with nonspecific infectious or inflammatory bronchitis. 2. There is minimal fine centrilobular nodularity scattered throughout the lungs and very minimal suggestion of very fine perilymphatic nodularity noted along the fissures. There are no definitive findings of mediastinal or pulmonary sarcoidosis at this time, although if the diagnosis of cardiac sarcoidosis is established, consider CT follow-up to assess for change over time. 3. No other evidence of fibrotic interstitial lung disease. 4. Coronary artery disease.    12/19/19: c.MRI IMPRESSION: 1. There is subendocardial late gadolinium enhancement suggestive of prior infarct, however does not occur in a coronary distribution, as there is circumferential subendocardial LGE at the base along with the mid inferoseptum/anteroseptum/anterior walls and apical septum. In addition there is nonischemic scar pattern with subepicardial LGE in the mid anterior and mid inferior walls. There is also LGE of the papillary muscles. 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 In addition, there are elevated T2 values in the basal to apical septum suggesting myocardial edema. Recommend cardiac PET scan for further evaluation of sarcoidosis. Would also recommend chest CT to evaluate for evidence of pulmonary sarcoid.   2. Moderate LV dilation with severe systolic dysfunction (EF 18%). Diffuse hypokinesis with thinning/akinesis of basal inferoseptum, basal to mid anterior, mid anteroseptum, mid inferolateral, and apical septum   3.  Normal RV size with moderate systolic dysfunction  (EF 38%)   12/16/19: LHC Prox LAD lesion is 70% stenosed. LV end diastolic pressure is moderately elevated. Hemodynamic findings consistent with moderate pulmonary hypertension.   1. Single vessel borderline obstructive disease involving the proximal LAD.  2. Moderately elevated LV filling pressures 3. Moderate pulmonary HTN 4. Preserved cardiac output with index 2.41.  Plan: would focus on optimizing medical therapy. He needs IV diuresis. Aubrey Voong switch lisinopril to losartan with the idea of switching to Entresto in 72 hours. Continue Toprol XL. Consider addition of aldactone. The degree of LV dysfunction is out of proportion to his CAD. If he does have anginal symptoms after optimizing medical therapy we could consider reevaluation of the proximal LAD for stenting. Consider EP evaluation of wide complex tachycardia.    Laboratory Data:  High Sensitivity Troponin:  No results for input(s): TROPONINIHS in the last 720 hours.   ChemistryNo results for input(s): NA, K, CL, CO2, GLUCOSE, BUN, CREATININE, CALCIUM, GFRNONAA, GFRAA, ANIONGAP in the last 168 hours.  No results for input(s): PROT, ALBUMIN, AST, ALT, ALKPHOS, BILITOT in the last 168 hours. HematologyNo results for input(s): WBC, RBC, HGB, HCT, MCV, MCH, MCHC, RDW, PLT in the last 168 hours. BNPNo results for input(s): BNP, PROBNP in the last 168 hours.  DDimer No results for input(s): DDIMER in the last 168 hours.   Radiology/Studies:  No results found.   Assessment and Plan:   1. WCT     Spontaneous conversion     Largely asymptomatic with a vage feeling of being "off" at home     [employer] rep Jagger Beahm come in to interrogate life vest for full data, time of inset (thugh he had it off to shower) and any other events he may have had  There is an atrial rhythm underlying though his precordial leads change and suspect is VT He was largely asymptomatic with his VT before as well. Await labs amio bolus has been ordered and riunning  to follow with gtt  Dr. Elberta Fortis Cabria Micalizzi see him once out of procedure.    For questions or updates, please contact [facility] Please consult [email or web address] for contact info under    Signed, Sheilah Pigeon, PA-C  02/03/2020 3:32 PM  I have seen and examined this patient with Francis Dowse.  Agree with above, note added to reflect my findings.  On exam, RRR, no murmurs.  Patient presented to the emergency room with a wide-complex tachycardia.  He felt similar to his prior episode of wide-complex tachycardia.  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  He is wearing a LifeVest, and fortunately his LifeVest did not shocked him as he aborted the therapy.  Review of his ECG shows a possible 2-1 atrial arrhythmia with aberrancy.  Due to that, we Chalonda Schlatter increase his metoprolol to 100 mg and start him on Eliquis as this did appear to be possibly in atrial flutter.  We Pearline Yerby plan for EP study with possible ablation next Wednesday.  Okay to discharge with follow-up on Wednesday.  With plan to hold his metoprolol 2 days prior to his procedure.  Blair Lundeen M. Prinston Kynard MD 02/03/2020 5:11 PM

## 2020-02-09 ENCOUNTER — Other Ambulatory Visit (HOSPITAL_COMMUNITY)
Admission: RE | Admit: 2020-02-09 | Discharge: 2020-02-09 | Disposition: A | Discharge status: Home / Self Care | Payer: BLUE CROSS/BLUE SHIELD | Source: Ambulatory Visit | Attending: Cardiology | Admitting: Cardiology

## 2020-02-09 DIAGNOSIS — Z01812 Encounter for preprocedural laboratory examination: Secondary | ICD-10-CM | POA: Insufficient documentation

## 2020-02-09 DIAGNOSIS — U071 COVID-19: Secondary | ICD-10-CM | POA: Diagnosis not present

## 2020-02-09 LAB — SARS CORONAVIRUS 2 (TAT 6-24 HRS): SARS Coronavirus 2: POSITIVE — AB

## 2020-02-10 ENCOUNTER — Telehealth: Payer: Self-pay | Admitting: *Deleted

## 2020-02-10 ENCOUNTER — Telehealth: Payer: Self-pay | Admitting: Cardiology

## 2020-02-10 NOTE — Telephone Encounter (Signed)
Covid screening positive. Pt reports no symptoms, no exposure that he is aware of. Aware ICD implant scheduled for tomorrow with be cancelled at this time. Aware I will be in touch to get him rescheduled. Pt agreeable to plan.

## 2020-02-10 NOTE — Telephone Encounter (Signed)
Late Entry: Spoke to pt around 5:45 pm last night.  Pt Covid screened today, result not back Reviewed procedure instructions for upcoming ICD implant scheduled for 1/26 (Dr. Elberta Fortis spoke w/ pt via telephone 1/21 to inform/discuss need for implant). Pt aware NPO after MN, arrive to [facility] 9:30 am, one visitor allowed, driver needed and someone to stay w/ him for 24 hours after leaving hospital. Aware office will call to arrange post procedure follow up. Patient verbalized understanding and agreeable to plan.

## 2020-02-10 NOTE — Telephone Encounter (Signed)
Left message to call back  

## 2020-02-10 NOTE — Telephone Encounter (Signed)
Charles Holmes is requesting a callback from [name] to give her the dates that would work best for him to reschedule his procedure. Please advise.

## 2020-02-11 ENCOUNTER — Encounter (HOSPITAL_COMMUNITY): Admission: RE | Payer: Self-pay | Source: Home / Self Care

## 2020-02-11 ENCOUNTER — Ambulatory Visit (HOSPITAL_COMMUNITY): Admission: RE | Admit: 2020-02-11 | Payer: BLUE CROSS/BLUE SHIELD | Source: Home / Self Care | Admitting: Cardiology

## 2020-02-11 SURGERY — A-FLUTTER ABLATION
Anesthesia: General

## 2020-02-13 ENCOUNTER — Telehealth: Payer: Self-pay | Admitting: Cardiology

## 2020-02-13 NOTE — Telephone Encounter (Signed)
Charles Holmes is calling requesting to speak with [name] in regards to dates for rescheduling his procedure that was canceled. Please advise.

## 2020-02-13 NOTE — Telephone Encounter (Signed)
Pt asking if can reschedule for 2nd week in March d/t work needs.  Aware I will discuss w/ Camnitz next week and let him know. He is agreeable to plan.

## 2020-02-18 ENCOUNTER — Emergency Department (HOSPITAL_COMMUNITY): Payer: BLUE CROSS/BLUE SHIELD

## 2020-02-18 ENCOUNTER — Other Ambulatory Visit: Payer: Self-pay

## 2020-02-18 ENCOUNTER — Inpatient Hospital Stay (HOSPITAL_COMMUNITY)
Admission: EM | Admit: 2020-02-18 | Discharge: 2020-02-21 | DRG: 227 | Disposition: A | Discharge status: Home / Self Care | Payer: BLUE CROSS/BLUE SHIELD | Attending: Cardiology | Admitting: Cardiology

## 2020-02-18 ENCOUNTER — Encounter (HOSPITAL_COMMUNITY): Payer: Self-pay | Admitting: *Deleted

## 2020-02-18 DIAGNOSIS — Z7901 Long term (current) use of anticoagulants: Secondary | ICD-10-CM

## 2020-02-18 DIAGNOSIS — E785 Hyperlipidemia, unspecified: Secondary | ICD-10-CM | POA: Diagnosis present

## 2020-02-18 DIAGNOSIS — I472 Ventricular tachycardia, unspecified: Secondary | ICD-10-CM

## 2020-02-18 DIAGNOSIS — N179 Acute kidney failure, unspecified: Secondary | ICD-10-CM | POA: Diagnosis present

## 2020-02-18 DIAGNOSIS — I959 Hypotension, unspecified: Secondary | ICD-10-CM | POA: Diagnosis present

## 2020-02-18 DIAGNOSIS — I428 Other cardiomyopathies: Secondary | ICD-10-CM | POA: Diagnosis present

## 2020-02-18 DIAGNOSIS — K219 Gastro-esophageal reflux disease without esophagitis: Secondary | ICD-10-CM | POA: Diagnosis present

## 2020-02-18 DIAGNOSIS — D869 Sarcoidosis, unspecified: Secondary | ICD-10-CM | POA: Diagnosis present

## 2020-02-18 DIAGNOSIS — Z8616 Personal history of COVID-19: Secondary | ICD-10-CM

## 2020-02-18 DIAGNOSIS — Z79899 Other long term (current) drug therapy: Secondary | ICD-10-CM

## 2020-02-18 DIAGNOSIS — Z95818 Presence of other cardiac implants and grafts: Secondary | ICD-10-CM

## 2020-02-18 DIAGNOSIS — Z7982 Long term (current) use of aspirin: Secondary | ICD-10-CM

## 2020-02-18 DIAGNOSIS — I248 Other forms of acute ischemic heart disease: Secondary | ICD-10-CM | POA: Diagnosis present

## 2020-02-18 DIAGNOSIS — Z8249 Family history of ischemic heart disease and other diseases of the circulatory system: Secondary | ICD-10-CM

## 2020-02-18 DIAGNOSIS — Z87891 Personal history of nicotine dependence: Secondary | ICD-10-CM

## 2020-02-18 DIAGNOSIS — I110 Hypertensive heart disease with heart failure: Secondary | ICD-10-CM | POA: Diagnosis present

## 2020-02-18 DIAGNOSIS — I272 Pulmonary hypertension, unspecified: Secondary | ICD-10-CM | POA: Diagnosis present

## 2020-02-18 DIAGNOSIS — I2510 Atherosclerotic heart disease of native coronary artery without angina pectoris: Secondary | ICD-10-CM | POA: Diagnosis present

## 2020-02-18 DIAGNOSIS — I4510 Unspecified right bundle-branch block: Secondary | ICD-10-CM | POA: Diagnosis present

## 2020-02-18 DIAGNOSIS — I5022 Chronic systolic (congestive) heart failure: Secondary | ICD-10-CM

## 2020-02-18 DIAGNOSIS — R000 Tachycardia, unspecified: Secondary | ICD-10-CM

## 2020-02-18 HISTORY — DX: Atherosclerotic heart disease of native coronary artery without angina pectoris: I25.10

## 2020-02-18 HISTORY — DX: Hyperlipidemia, unspecified: E78.5

## 2020-02-18 LAB — CBC
HCT: 51.8 % (ref 39.0–52.0)
Hemoglobin: 16.4 g/dL (ref 13.0–17.0)
MCH: 31.7 pg (ref 26.0–34.0)
MCHC: 31.7 g/dL (ref 30.0–36.0)
MCV: 100.2 fL — ABNORMAL HIGH (ref 80.0–100.0)
Platelets: 187 10*3/uL (ref 150–400)
RBC: 5.17 MIL/uL (ref 4.22–5.81)
RDW: 12.3 % (ref 11.5–15.5)
WBC: 9.7 10*3/uL (ref 4.0–10.5)
nRBC: 0 % (ref 0.0–0.2)

## 2020-02-18 MED ORDER — AMIODARONE LOAD VIA INFUSION
150.0000 mg | Freq: Once | INTRAVENOUS | Status: AC
  Administered 2020-02-19: 150 mg via INTRAVENOUS
  Filled 2020-02-18: qty 83.34

## 2020-02-18 MED ORDER — AMIODARONE HCL IN DEXTROSE 360-4.14 MG/200ML-% IV SOLN
30.0000 mg/h | INTRAVENOUS | Status: AC
  Administered 2020-02-19 – 2020-02-20 (×3): 30 mg/h via INTRAVENOUS
  Filled 2020-02-18 (×3): qty 200

## 2020-02-18 MED ORDER — SODIUM CHLORIDE 0.9 % IV SOLN: INTRAVENOUS | Status: DC

## 2020-02-18 MED ORDER — AMIODARONE HCL IN DEXTROSE 360-4.14 MG/200ML-% IV SOLN
60.0000 mg/h | INTRAVENOUS | Status: AC
  Administered 2020-02-18 – 2020-02-19 (×3): 60 mg/h via INTRAVENOUS
  Filled 2020-02-18 (×2): qty 200

## 2020-02-18 NOTE — ED Triage Notes (Signed)
The pt has a life vest.  He has been being shocked all day and the last time was while he was being triaged

## 2020-02-18 NOTE — ED Triage Notes (Signed)
Pos for covid jan 24th

## 2020-02-18 NOTE — ED Provider Notes (Signed)
[facility] EMERGENCY DEPARTMENT Provider Note  CSN: [id number] Arrival date & time: 02/18/20 2306  Chief Complaint(s) AICD Problem  HPI Charles Holmes is a 55 y.o. 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 tach requiring LifeVest since November presents to the emergency department for tachycardia and LifeVest alarm going off. Patient reports that he has been feeling fatigued for today. Noted this while at work and going up the hill. At that time he began noticing his LifeVest alarm going off and has been depressing for the delay treatment button.  He reports checking his heart rate when he got home this evening and noticing that his heart rate was in the 150s.  That will prompted his visit to the ED. He denied any shortness of breath or chest pain. No headache or dizziness.. No nausea or vomiting. No abdominal pain. No other physical complaints.  Of note patient was seen 2 weeks ago in the emergency department for similar episode.  At that time his tachycardia resolved after beta-blockers.  Patient's metoprolol was increased to 100 mg/day. He reports that he believes his been taking it incorrectly and has actually been taking 150 mg daily.  Reports that he just noticed this today.  HPI  Past Medical History Past Medical History:  Diagnosis Date  . GERD (gastroesophageal reflux disease)   . Hypertension    Patient Active Problem List   Diagnosis Date Noted  . Ventricular tachycardia (HCC) 02/19/2020  . AKI (acute kidney injury) (HCC) 12/19/2019  . CAD (coronary artery disease) 12/19/2019  . Hyperlipidemia with target LDL less than 70 12/19/2019  . Wide-complex tachycardia (HCC) 12/16/2019  . Acute systolic CHF (congestive heart failure) (HCC) 12/16/2019  . HTN (hypertension) 12/16/2019   Home Medication(s) Prior to Admission medications   Medication Sig Start Date End Date  Taking? Authorizing Provider  apixaban (ELIQUIS) 5 MG TABS tablet Take 1 tablet (5 mg total) by mouth 2 (two) times daily. 02/03/20   Mancel Bale, MD  aspirin EC 81 MG EC tablet Take 1 tablet (81 mg total) by mouth daily. 12/19/19   [place], Peter M, MD  atorvastatin (LIPITOR) 80 MG tablet Take 1 tablet (80 mg total) by mouth daily. 12/19/19   [place], Peter M, MD  dapagliflozin propanediol (FARXIGA) 10 MG TABS tablet Take 1 tablet (10 mg total) by mouth daily. 12/19/19   [place], Peter M, MD  losartan (COZAAR) 25 MG tablet Take 1 tablet (25 mg total) by mouth daily. 12/19/19   [place], Peter M, MD  metoprolol succinate (TOPROL-XL) 100 MG 24 hr tablet Take 0.5 tablets (50 mg total) by mouth daily. Take with or immediately following a meal. Patient taking differently: Take 100 mg by mouth daily. Take with or immediately following a meal. 02/03/20   Mancel Bale, MD  pantoprazole (PROTONIX) 40 MG tablet Take 40 mg by mouth daily before breakfast.    [provider]  spironolactone (ALDACTONE) 25 MG tablet Take 0.5 tablets (12.5 mg total) by mouth daily. 12/19/19   [place], Peter M, MD  VENTOLIN HFA 108 [phone] Base) MCG/ACT inhaler Inhale 2 puffs into the lungs every 6 (six) hours as needed for wheezing or shortness of breath.  08/13/19   [provider]  Past Surgical History Past Surgical History:  Procedure Laterality Date  . RIGHT/LEFT HEART CATH AND CORONARY ANGIOGRAPHY N/A 12/16/2019   Procedure: RIGHT/LEFT HEART CATH AND CORONARY ANGIOGRAPHY;  Surgeon: [place], Peter M, MD;  Location: [facility] INVASIVE CV LAB;  Service: Cardiovascular;  Laterality: N/A;   Family History Family History  Problem Relation Age of Onset  . Hypertension Father   . Heart disease Father   . Heart disease Mother     Social History Social History   Tobacco Use  . Smoking status:  Former Smoker    Types: Cigarettes  . Smokeless tobacco: Never Used  Vaping Use  . Vaping Use: Unknown  Substance Use Topics  . Alcohol use: Yes    Alcohol/week: 6.0 standard drinks    Types: 6 Cans of beer per week  . Drug use: Never   Allergies Percocet [oxycodone-acetaminophen]  Review of Systems Review of Systems All other systems are reviewed and are negative for acute change except as noted in the HPI  Physical Exam Vital Signs  I have reviewed the triage vital signs BP 101/68   Pulse (!) 52   Temp (!) 96.9 F (36.1 C) (Temporal)   Resp (!) 23   Ht 5\' 8"  (1.727 m)   Wt 81.5 kg   SpO2 94%   BMI 27.32 kg/m   Physical Exam Vitals reviewed.  Constitutional:      General: He is not in acute distress.    Appearance: He is well-developed and well-nourished. He is not diaphoretic.  HENT:     Head: Normocephalic and atraumatic.     Nose: Nose normal.  Eyes:     General: No scleral icterus.       Right eye: No discharge.        Left eye: No discharge.     Extraocular Movements: EOM normal.     Conjunctiva/sclera: Conjunctivae normal.     Pupils: Pupils are equal, round, and reactive to light.  Cardiovascular:     Rate and Rhythm: Regular rhythm. Tachycardia present.     Heart sounds: No murmur heard. No friction rub. No gallop.   Pulmonary:     Effort: Pulmonary effort is normal. No respiratory distress.     Breath sounds: Normal breath sounds. No stridor. No rales.  Chest:     Comments: LifeVest on Abdominal:     General: There is no distension.     Palpations: Abdomen is soft.     Tenderness: There is no abdominal tenderness.  Musculoskeletal:        General: No tenderness or edema.     Cervical back: Normal range of motion and neck supple.  Skin:    General: Skin is warm and dry.     Findings: No erythema or rash.  Neurological:     Mental Status: He is alert and oriented to person, place, and time.  Psychiatric:        Mood and Affect: Mood and  affect normal.     ED Results and Treatments Labs (all labs ordered are listed, but only abnormal results are displayed) Labs Reviewed  BASIC METABOLIC PANEL - Abnormal; Notable for the following components:      Result Value   Glucose, Bld 139 (*)    BUN 22 (*)    Creatinine, Ser 1.72 (*)    GFR, Estimated 46 (*)    All other components within normal limits  CBC - Abnormal; Notable for the following components:   MCV 100.2 (*)  All other components within normal limits  TROPONIN I (HIGH SENSITIVITY) - Abnormal; Notable for the following components:   Troponin I (High Sensitivity) 547 (*)    All other components within normal limits  MAGNESIUM  CBG MONITORING, ED  TROPONIN I (HIGH SENSITIVITY)                                                                                                                         EKG     EKG Interpretation  Date/Time:  Thursday February 19 2020 00:21:27 EST Ventricular Rate:  59 PR Interval:    QRS Duration: 165 QT Interval:  451 QTC Calculation: 447 R Axis:   -87 Text Interpretation: Sinus rhythm Nonspecific IVCD with LAD Anteroseptal infarct, age indeterminate V tach resolved Confirmed by Drema Pryardama, Milfred Krammes [phone]([id number]) on 02/19/2020 1:33:24 AM      Radiology DG Chest Portable 1 View  Result Date: 02/18/2020 CLINICAL DATA:  life vest chocking him all day shocking him in triage EXAM: PORTABLE CHEST 1 VIEW COMPARISON:  Chest radiograph 02/03/2020 FINDINGS: Overlying artifact from life vest. Mild cardiomegaly. Mediastinal contours are unchanged. No pulmonary edema. No focal airspace disease, pneumothorax, or pleural effusion. Remote right rib fractures again seen. IMPRESSION: Mild cardiomegaly without acute abnormality. Overlying life vest in place. Electronically Signed   By: Narda RutherfordMelanie  Sanford M.D.   On: 02/18/2020 23:51    Pertinent labs & imaging results that were available during my care of the patient were reviewed by me and considered in my  medical decision making (see chart for details).  Medications Ordered in ED Medications  0.9 %  sodium chloride infusion ( Intravenous New Bag/Given 02/18/20 2355)  amiodarone (NEXTERONE) 1.8 mg/mL load via infusion 150 mg (150 mg Intravenous Bolus from Bag 02/19/20 0023)    Followed by  amiodarone (NEXTERONE PREMIX) 360-4.14 MG/200ML-% (1.8 mg/mL) IV infusion (60 mg/hr Intravenous New Bag/Given 02/19/20 0035)    Followed by  amiodarone (NEXTERONE PREMIX) 360-4.14 MG/200ML-% (1.8 mg/mL) IV infusion (has no administration in time range)  midazolam (VERSED) 2 MG/2ML injection (2 mg  Given 02/19/20 0015)  midazolam (VERSED) 2 MG/2ML injection (2 mg  Given 02/19/20 0018)                                                                                                                                    Procedures .1-3 Lead EKG Interpretation Performed by: Nira Connardama, Nahom Carfagno Eduardo,  MD Authorized by: Nira Conn, MD     Interpretation: abnormal     ECG rate:  148   ECG rate assessment: tachycardic     Rhythm: ventricular tachycardia     Ectopy: none     Conduction: normal   .Sedation  Date/Time: 02/19/2020 1:59 AM Performed by: Nira Conn, MD Authorized by: Nira Conn, MD   Consent:    Consent obtained:  Verbal and written   Consent given by:  Patient   Risks discussed:  Allergic reaction, dysrhythmia, inadequate sedation, nausea, prolonged hypoxia resulting in organ damage, prolonged sedation necessitating reversal, respiratory compromise necessitating ventilatory assistance and intubation and vomiting   Alternatives discussed:  Analgesia without sedation, anxiolysis and regional anesthesia Universal protocol:    Procedure explained and questions answered to patient or proxy's satisfaction: yes     Relevant documents present and verified: yes     Test results available: yes     Imaging studies available: yes     Required blood products, implants, devices, and  special equipment available: yes     Site/side marked: yes     Immediately prior to procedure, a time out was called: yes     Patient identity confirmed:  Verbally with patient Indications:    Procedure performed:  Cardioversion   Procedure necessitating sedation performed by:  Physician performing sedation Pre-sedation assessment:    Time since last food or drink:  .   ASA classification: class 4 - patient with severe systemic disease that is a constant threat to life     Mouth opening:  2 finger widths   Thyromental distance:  4 finger widths   Mallampati score:  II - soft palate, uvula, fauces visible   Neck mobility: normal     Pre-sedation assessments completed and reviewed: airway patency, cardiovascular function, hydration status, mental status, nausea/vomiting, pain level, respiratory function and temperature     Pre-sedation assessment completed:  02/19/2020 12:02 AM Immediate pre-procedure details:    Reassessment: Patient reassessed immediately prior to procedure     Reviewed: vital signs, relevant labs/tests and NPO status     Verified: bag valve mask available, emergency equipment available, intubation equipment available, IV patency confirmed, oxygen available and suction available   Procedure details (see MAR for exact dosages):    Preoxygenation:  Nasal cannula   Sedation:  Midazolam   Intended level of sedation: moderate (conscious sedation)   Intra-procedure monitoring:  Blood pressure monitoring, cardiac monitor, continuous pulse oximetry, frequent LOC assessments, frequent vital sign checks and continuous capnometry   Intra-procedure events: none     Total Provider sedation time (minutes):  15 Post-procedure details:    Post-sedation assessment completed:  02/19/2020 1:01 AM   Attendance: Constant attendance by certified staff until patient recovered     Recovery: Patient returned to pre-procedure baseline     Post-sedation assessments completed and reviewed: airway  patency, cardiovascular function, hydration status, mental status, nausea/vomiting, pain level, respiratory function and temperature     Patient is stable for discharge or admission: yes     Procedure completion:  Tolerated well, no immediate complications .Cardioversion  Date/Time: 02/19/2020 2:01 AM Performed by: Nira Conn, MD Authorized by: Nira Conn, MD   Consent:    Consent obtained:  Written, verbal and emergent situation   Risks discussed:  Cutaneous burn, death, induced arrhythmia and pain   Alternatives discussed:  Delayed treatment Pre-procedure details:    Cardioversion basis:  Emergent   Rhythm:  Ventricular tachycardia   Electrode placement:  Anterior-posterior Patient sedated: Yes. Refer to sedation procedure documentation for details of sedation.  Attempt one:    Cardioversion mode:  Synchronous   Waveform:  Biphasic   Shock (Joules):  120   Shock outcome:  Conversion to normal sinus rhythm Post-procedure details:    Patient status:  Alert   Patient tolerance of procedure:  Tolerated well, no immediate complications .Critical Care Performed by: Nira Conn, MD Authorized by: Nira Conn, MD   Critical care provider statement:    Critical care time (minutes):  45   Critical care start time:  02/19/2020 12:00 AM   Critical care end time:  02/19/2020 2:02 AM   Critical care was necessary to treat or prevent imminent or life-threatening deterioration of the following conditions:  Cardiac failure and circulatory failure   Critical care was time spent personally by me on the following activities:  Discussions with consultants, evaluation of patient's response to treatment, examination of patient, ordering and performing treatments and interventions, ordering and review of laboratory studies, ordering and review of radiographic studies, pulse oximetry, re-evaluation of patient's condition, obtaining history from patient or  surrogate, review of old charts and development of treatment plan with patient or surrogate   Care discussed with: admitting provider      (including critical care time)  Medical Decision Making / ED Course I have reviewed the nursing notes for this encounter and the patient's prior records (if available in EHR or on provided paperwork).   Charles Holmes was evaluated in Emergency Department on 02/19/2020 for the symptoms described in the history of present illness. He was evaluated in the context of the global COVID-19 pandemic, which necessitated consideration that the patient might be at risk for infection with the SARS-CoV-2 virus that causes COVID-19. Institutional protocols and algorithms that pertain to the evaluation of patients at risk for COVID-19 are in a state of rapid change based on information released by regulatory bodies including the CDC and federal and state organizations. These policies and algorithms were followed during the patient's care in the ED.  Patient presented with tachycardia and LifeVest alarm. Found to be in sustained ventricular tachycardia. Initially patient was hemodynamically stable with systolic BPs in the 100s. Patient was immediately brought back to trauma bay. Blood work was obtained an EKG confirmed the V. tach as above. Cardiology was immediately consulted who will come and evaluate the patient.  While awaiting cardiology evaluation, the patient became unstable and hypotensive with systolics in the 80s.  He was still asymptomatic however given his instability the decision was made to perform synchronized cardioversion.  This was a shared decision with the patient and cardiology over the phone.  Patient was sedated and cardioverted as above.  Converted to normal sinus rhythm. He was started on amiodarone load and drip.  Admitted to cardiac ICU for continued work-up and management      Final Clinical Impression(s) / ED Diagnoses Final diagnoses:   V-tach [facility])      This chart was dictated using voice recognition software.  Despite best efforts to proofread,  errors can occur which can change the documentation meaning.   Nira Conn, MD 02/19/20 [phone]

## 2020-02-19 ENCOUNTER — Encounter (HOSPITAL_COMMUNITY): Payer: Self-pay | Admitting: Cardiovascular Disease

## 2020-02-19 DIAGNOSIS — I5022 Chronic systolic (congestive) heart failure: Secondary | ICD-10-CM

## 2020-02-19 DIAGNOSIS — I472 Ventricular tachycardia, unspecified: Secondary | ICD-10-CM

## 2020-02-19 DIAGNOSIS — I110 Hypertensive heart disease with heart failure: Secondary | ICD-10-CM | POA: Diagnosis present

## 2020-02-19 DIAGNOSIS — I959 Hypotension, unspecified: Secondary | ICD-10-CM | POA: Diagnosis present

## 2020-02-19 DIAGNOSIS — Z7982 Long term (current) use of aspirin: Secondary | ICD-10-CM | POA: Diagnosis not present

## 2020-02-19 DIAGNOSIS — D869 Sarcoidosis, unspecified: Secondary | ICD-10-CM | POA: Diagnosis present

## 2020-02-19 DIAGNOSIS — I428 Other cardiomyopathies: Secondary | ICD-10-CM | POA: Diagnosis not present

## 2020-02-19 DIAGNOSIS — Z8616 Personal history of COVID-19: Secondary | ICD-10-CM | POA: Diagnosis not present

## 2020-02-19 DIAGNOSIS — N179 Acute kidney failure, unspecified: Secondary | ICD-10-CM | POA: Diagnosis not present

## 2020-02-19 DIAGNOSIS — Z87891 Personal history of nicotine dependence: Secondary | ICD-10-CM | POA: Diagnosis not present

## 2020-02-19 DIAGNOSIS — I272 Pulmonary hypertension, unspecified: Secondary | ICD-10-CM | POA: Diagnosis present

## 2020-02-19 DIAGNOSIS — Z79899 Other long term (current) drug therapy: Secondary | ICD-10-CM | POA: Diagnosis not present

## 2020-02-19 DIAGNOSIS — I248 Other forms of acute ischemic heart disease: Secondary | ICD-10-CM | POA: Diagnosis not present

## 2020-02-19 DIAGNOSIS — I2510 Atherosclerotic heart disease of native coronary artery without angina pectoris: Secondary | ICD-10-CM | POA: Diagnosis not present

## 2020-02-19 DIAGNOSIS — Z7901 Long term (current) use of anticoagulants: Secondary | ICD-10-CM | POA: Diagnosis not present

## 2020-02-19 DIAGNOSIS — E785 Hyperlipidemia, unspecified: Secondary | ICD-10-CM | POA: Diagnosis present

## 2020-02-19 DIAGNOSIS — K219 Gastro-esophageal reflux disease without esophagitis: Secondary | ICD-10-CM | POA: Diagnosis not present

## 2020-02-19 DIAGNOSIS — I4510 Unspecified right bundle-branch block: Secondary | ICD-10-CM | POA: Diagnosis present

## 2020-02-19 DIAGNOSIS — Z8249 Family history of ischemic heart disease and other diseases of the circulatory system: Secondary | ICD-10-CM | POA: Diagnosis not present

## 2020-02-19 LAB — BASIC METABOLIC PANEL
Anion gap: 11 (ref 5–15)
Anion gap: 7 (ref 5–15)
BUN: 20 mg/dL (ref 6–20)
BUN: 22 mg/dL — ABNORMAL HIGH (ref 6–20)
CO2: 28 mmol/L (ref 22–32)
CO2: 30 mmol/L (ref 22–32)
Calcium: 8.4 mg/dL — ABNORMAL LOW (ref 8.9–10.3)
Calcium: 9.2 mg/dL (ref 8.9–10.3)
Chloride: 104 mmol/L (ref 98–111)
Chloride: 105 mmol/L (ref 98–111)
Creatinine, Ser: 1.29 mg/dL — ABNORMAL HIGH (ref 0.61–1.24)
Creatinine, Ser: 1.72 mg/dL — ABNORMAL HIGH (ref 0.61–1.24)
GFR, Estimated: 46 mL/min — ABNORMAL LOW (ref >60)
GFR, Estimated: >60 mL/min (ref >60)
Glucose, Bld: 139 mg/dL — ABNORMAL HIGH (ref 70–99)
Glucose, Bld: 93 mg/dL (ref 70–99)
Potassium: 4.3 mmol/L (ref 3.5–5.1)
Potassium: 4.5 mmol/L (ref 3.5–5.1)
Sodium: 142 mmol/L (ref 135–145)
Sodium: 143 mmol/L (ref 135–145)

## 2020-02-19 LAB — MAGNESIUM: Magnesium: 2 mg/dL (ref 1.7–2.4)

## 2020-02-19 LAB — TROPONIN I (HIGH SENSITIVITY)
Troponin I (High Sensitivity): 547 ng/L (ref <18)
Troponin I (High Sensitivity): 747 ng/L (ref <18)

## 2020-02-19 LAB — HIV ANTIBODY (ROUTINE TESTING W REFLEX): HIV Screen 4th Generation wRfx: NONREACTIVE

## 2020-02-19 LAB — MRSA PCR SCREENING: MRSA by PCR: NEGATIVE

## 2020-02-19 MED ORDER — PREDNISONE 20 MG PO TABS
30.0000 mg | ORAL_TABLET | Freq: Every day | ORAL | Status: DC
  Administered 2020-02-19 – 2020-02-21 (×3): 30 mg via ORAL
  Filled 2020-02-19 (×3): qty 1

## 2020-02-19 MED ORDER — SODIUM CHLORIDE 0.9 % IV SOLN: INTRAVENOUS | Status: DC

## 2020-02-19 MED ORDER — MIDAZOLAM HCL 2 MG/2ML IJ SOLN
INTRAMUSCULAR | Status: AC
  Administered 2020-02-19: 2 mg
  Filled 2020-02-19: qty 2

## 2020-02-19 MED ORDER — SODIUM CHLORIDE 0.9 % IV SOLN
80.0000 mg | INTRAVENOUS | Status: AC
  Administered 2020-02-20: 80 mg

## 2020-02-19 MED ORDER — DAPAGLIFLOZIN PROPANEDIOL 10 MG PO TABS: 10.0000 mg | ORAL_TABLET | Freq: Every day | ORAL | Status: DC

## 2020-02-19 MED ORDER — CEFAZOLIN SODIUM-DEXTROSE 2-4 GM/100ML-% IV SOLN
2.0000 g | INTRAVENOUS | Status: AC
  Administered 2020-02-20: 2 g via INTRAVENOUS

## 2020-02-19 MED ORDER — LOSARTAN POTASSIUM 25 MG PO TABS: 25.0000 mg | ORAL_TABLET | Freq: Every day | ORAL | Status: DC

## 2020-02-19 MED ORDER — SULFAMETHOXAZOLE-TRIMETHOPRIM 800-160 MG PO TABS
1.0000 | ORAL_TABLET | ORAL | Status: DC
  Administered 2020-02-20: 1 via ORAL
  Filled 2020-02-19: qty 1

## 2020-02-19 MED ORDER — SODIUM CHLORIDE 0.9% FLUSH
3.0000 mL | Freq: Two times a day (BID) | INTRAVENOUS | Status: DC
  Administered 2020-02-20: 3 mL via INTRAVENOUS

## 2020-02-19 MED ORDER — CHLORHEXIDINE GLUCONATE 4 % EX LIQD
60.0000 mL | Freq: Once | CUTANEOUS | Status: AC
  Administered 2020-02-20: 4 via TOPICAL
  Filled 2020-02-19: qty 60

## 2020-02-19 MED ORDER — SODIUM CHLORIDE 0.9% FLUSH: 3.0000 mL | INTRAVENOUS | Status: DC | PRN

## 2020-02-19 MED ORDER — ASPIRIN EC 81 MG PO TBEC
81.0000 mg | DELAYED_RELEASE_TABLET | Freq: Every day | ORAL | Status: DC
  Administered 2020-02-19 – 2020-02-21 (×3): 81 mg via ORAL
  Filled 2020-02-19 (×3): qty 1

## 2020-02-19 MED ORDER — SODIUM CHLORIDE 0.9 % IV SOLN: 250.0000 mL | INTRAVENOUS | Status: DC

## 2020-02-19 MED ORDER — CHLORHEXIDINE GLUCONATE CLOTH 2 % EX PADS
6.0000 | MEDICATED_PAD | Freq: Every day | CUTANEOUS | Status: DC
  Administered 2020-02-19: 6 via TOPICAL

## 2020-02-19 MED ORDER — CHLORHEXIDINE GLUCONATE 4 % EX LIQD
60.0000 mL | Freq: Once | CUTANEOUS | Status: AC
  Administered 2020-02-19: 4 via TOPICAL
  Filled 2020-02-19: qty 60

## 2020-02-19 MED ORDER — SPIRONOLACTONE 12.5 MG HALF TABLET: 12.5000 mg | ORAL_TABLET | Freq: Every day | ORAL | Status: DC

## 2020-02-19 MED ORDER — NITROGLYCERIN 0.4 MG SL SUBL: 0.4000 mg | SUBLINGUAL_TABLET | SUBLINGUAL | Status: DC | PRN

## 2020-02-19 MED ORDER — ATORVASTATIN CALCIUM 80 MG PO TABS
80.0000 mg | ORAL_TABLET | Freq: Every day | ORAL | Status: DC
  Administered 2020-02-19 – 2020-02-21 (×3): 80 mg via ORAL
  Filled 2020-02-19 (×3): qty 1

## 2020-02-19 MED ORDER — PANTOPRAZOLE SODIUM 40 MG PO TBEC
40.0000 mg | DELAYED_RELEASE_TABLET | Freq: Every day | ORAL | Status: DC
  Administered 2020-02-19 – 2020-02-21 (×3): 40 mg via ORAL
  Filled 2020-02-19 (×3): qty 1

## 2020-02-19 MED ORDER — ONDANSETRON HCL 4 MG/2ML IJ SOLN: 4.0000 mg | Freq: Four times a day (QID) | INTRAMUSCULAR | Status: DC | PRN

## 2020-02-19 MED ORDER — METOPROLOL SUCCINATE ER 50 MG PO TB24: 50.0000 mg | ORAL_TABLET | Freq: Every day | ORAL | Status: DC

## 2020-02-19 NOTE — ED Notes (Signed)
Life vest removed by Dr. Eudelia Bunch, pt remains on Zoll pads at this time, preparation for synch cardioversion. RT notified.

## 2020-02-19 NOTE — Consult Note (Signed)
Advanced Heart Failure Team Consult Note   Primary Physician: [name], PA-C PCP-Cardiologist:  Garwin Brothers, MD  Reason for Consultation: VT   HPI:    Charles Holmes is seen today for evaluation of recurrent VT at the request of Allred.   Mr Crossen is a 55 year old with history of HTN, GERD, systolic HF due to NICM and VT.   He presented to [facility] on 12/16/19 with CP. Found to have VT and  EF 25-30% mild AI, MR and TR. Placed on cardizem gtt without change. He converted to sinus rhythm with IV lopressor. Transferred to [facility] for further management. Had cath that showed 70% prox LAD with no intervention.  He was discharged with Life Vest.   cMRI 12/21: LVEF 18% LGE pattern suggestive of cardiac sarcoid.   On 02/03/20 he presented to the HF clinic as a new patient. On arrival LifeVest was alarming and he was silencing alarm. Felt ok. EKG in the HF clnic- WCT 152 bpm. Went to ED and was seen by EP. Eventually converted to NSR and went home. Amiodarone started. 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 PET scan at [facility] ordered.    Planned for ICD implant though his pre-procedure COVID test was positive on 02/09/20 and implant delayed  He was readmitted yesterday/overnight when he came to the hospital with c/o palpitations and worsened fatigue, no CP, reported several life vest alarms. Found to be in recurrent slw VT. He was sedated and cardioverted in the ER.  Remains in NSR. Denies DOE, edema, orthopnea or PND.Taking all meds as prescribed.    Cardiac studies:  Cath 12/16/19 - LAD 70%prox. Otherwise normal cors  cMRI 12/19/19 1. LVEF 18% Diffuse hypokinesis with thinning/akinesis of basal inferoseptum, basal to mid anterior, mid anteroseptum, mid inferolateral, and apical septum 2. There is subendocardial late gadolinium enhancement suggestive of prior infarct, however does not occur in a coronary distribution, as there is circumferential  subendocardial LGE at the base along with the mid inferoseptum/anteroseptum/anterior walls and apical septum. In addition there is nonischemic scar pattern with subepicardial LGE in the mid anterior and mid inferior walls. There is also LGE of the papillary muscles. 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 3. Normal RV size with moderate systolic dysfunction (EF 38%)   High Res CT 12/30/19: Not suggestive of pulmonary sarcoid   Review of Systems: [y] = yes, [ ]  = no   . General: Weight gain [ ] ; Weight loss [ ] ; Anorexia [ ] ; Fatigue ]; Fever [ ] ; Chills [ ] ; Weakness [ ]   . Cardiac: Chest pain/pressure [ ] ; Resting SOB [ ] ; Exertional SOB ]; Orthopnea [ ] ; Pedal Edema [ ] ; Palpitations ]; Syncope [ ] ; Presyncope [ ] ; Paroxysmal nocturnal dyspnea[ ]   . Pulmonary: Cough [ ] ; Wheezing[ ] ; Hemoptysis[ ] ; Sputum [ ] ; Snoring [ ]   . GI: Vomiting[ ] ; Dysphagia[ ] ; Melena[ ] ; Hematochezia [ ] ; Heartburn[ ] ; Abdominal pain [ ] ; Constipation [ ] ; Diarrhea [ ] ; BRBPR [ ]   . GU: Hematuria[ ] ; Dysuria [ ] ; Nocturia[ ]   . Vascular: Pain in legs with walking [ ] ; Pain in feet with lying flat [ ] ; Non-healing sores [ ] ; Stroke [ ] ; TIA [ ] ; Slurred speech [ ] ;  . Neuro: Headaches[ ] ; Vertigo[ ] ; Seizures[ ] ; Paresthesias[ ] ;Blurred vision [ ] ; Diplopia [ ] ; Vision changes [ ]   . Ortho/Skin: Arthritis [ y]; Joint pain [  Y ]; Muscle pain [ ] ; Joint swelling [ ] ; Back Pain [Y ]; Rash [ ]   . Psych: Depression[ ] ; Anxiety[ ]   . Heme: Bleeding problems [ ] ; Clotting disorders [ ] ; Anemia [ ]   . Endocrine: Diabetes [ ] ; Thyroid dysfunction[ ]   Home Medications Prior to Admission medications   Medication Sig Start Date End Date Taking? Authorizing Provider  aspirin EC 81 MG EC tablet Take 1 tablet (81 mg total) by mouth daily. 12/19/19   , Peter M, MD  atorvastatin (LIPITOR) 80 MG tablet Take 1 tablet (80 mg total) by mouth daily. 12/19/19   , Peter M, MD   dapagliflozin propanediol (FARXIGA) 10 MG TABS tablet Take 1 tablet (10 mg total) by mouth daily. 12/19/19   , Peter M, MD  losartan (COZAAR) 25 MG tablet Take 1 tablet (25 mg total) by mouth daily. 12/19/19   14/3/21, Peter M, MD  metoprolol succinate (TOPROL-XL) 50 MG 24 hr tablet Take 1 tablet (50 mg total) by mouth daily. Take with or immediately following a meal. 12/19/19   14/3/21, Peter M, MD  Multiple Vitamins-Minerals (ONE-A-DAY MENS 50+ ADVANTAGE) TABS Take 1 tablet by mouth daily with breakfast.    [provider]  pantoprazole (PROTONIX) 40 MG tablet Take 40 mg by mouth daily before breakfast.    [provider]  spironolactone (ALDACTONE) 25 MG tablet Take 0.5 tablets (12.5 mg total) by mouth daily. 12/19/19   14/3/21, Peter M, MD  VENTOLIN HFA 108 [phone] Base) MCG/ACT inhaler Inhale 2 puffs into the lungs every 6 (six) hours as needed for wheezing or shortness of breath.  08/13/19   [provider]    Past Medical History: Past Medical History:  Diagnosis Date  . CAD (coronary artery disease)   . GERD (gastroesophageal reflux disease)   . Hyperlipidemia   . Hypertension   . Systolic heart failure (HCC) 2021   LVEF 18%, RVEF 38% on cardiac MRI 12/19/2019. possible cardiac sarcoidosis.  . Wide-complex tachycardia (HCC) 2021   wears LifeVest    Past Surgical History: Past Surgical History:  Procedure Laterality Date  . RIGHT/LEFT HEART CATH AND CORONARY ANGIOGRAPHY N/A 12/16/2019   Procedure: RIGHT/LEFT HEART CATH AND CORONARY ANGIOGRAPHY;  Surgeon: Swazil[place], Peter M, MD;  Location: [facility] INVASIVE CV LAB;  Service: Cardiovascular;  Laterality: N/A;    Family History: Family History  Problem Relation Age of Onset  . Hypertension Father   . Heart disease Father   . Heart disease Mother     Social History: Social History   Socioeconomic History  . Marital status: Divorced    Spouse name: Not on file  . Number of children: Not on file  . Years of  education: Not on file  . Highest education level: Not on file  Occupational History  . Not on file  Tobacco Use  . Smoking status: Former Smoker    Types: Cigarettes  . Smokeless tobacco: Never Used  Vaping Use  . Vaping Use: Unknown  Substance and Sexual Activity  . Alcohol use: Yes    Alcohol/week: 6.0 standard drinks    Types: 6 Cans of beer per week  . Drug use: Yes    Types: Marijuana    Comment: stopped using months ago d/t how it affected breathing  . Sexual activity: Yes  Other Topics Concern  . Not on file  Social History Narrative  . Not on file   Social Determinants of Health   Financial Resource Strain: Not  on file  Food Insecurity: Not on file  Transportation Needs: Not on file  Physical Activity: Not on file  Stress: Not on file  Social Connections: Not on file    Allergies:  Allergies  Allergen Reactions  . Percocet [Oxycodone-Acetaminophen] Itching    Objective:    Vital Signs:   Temp:  [96.9 F (36.1 C)-98.5 F (36.9 C)] 98.2 F (36.8 C) (02/03 1139) Pulse Rate:  [45-73] 45 (02/03 1200) Resp:  [12-26] 16 (02/03 1200) BP: (73-121)/(47-86) 107/69 (02/03 1200) SpO2:  [91 %-100 %] 96 % (02/03 1200) Weight:  [80.8 kg-81.5 kg] 80.8 kg (02/03 0300) Last BM Date: 02/19/20  Weight change: Filed Weights   02/18/20 2318 02/19/20 0300  Weight: 81.5 kg 80.8 kg    Intake/Output:   Intake/Output Summary (Last 24 hours) at 02/19/2020 1229 Last data filed at 02/19/2020 1200 Gross per 24 hour  Intake 1349.26 ml  Output 510 ml  Net 839.26 ml      Physical Exam    General:  Well appearing. No resp difficulty HEENT: normal Neck: supple. no JVD. Carotids 2+ bilat; no bruits. No lymphadenopathy or thryomegaly appreciated. Cor: PMI nondisplaced. Brady regular No rubs, gallops or murmurs. Lungs: clear Abdomen: soft, nontender, nondistended. No hepatosplenomegaly. No bruits or masses. Good bowel sounds. Extremities: no cyanosis, clubbing, rash,  edema Neuro: alert & orientedx3, cranial nerves grossly intact. moves all 4 extremities w/o difficulty. Affect pleasant   Telemetry   Sinus brady 40-50s Personally reviewed   EKG   Sinus brady 51 RBBB Personally reviewed  Labs   Basic Metabolic Panel: Recent Labs  Lab 02/18/20 2331 02/19/20 0448 02/19/20 0844  NA 143  --  142  K 4.5  --  4.3  CL 104  --  105  CO2 28  --  30  GLUCOSE 139*  --  93  BUN 22*  --  20  CREATININE 1.72*  --  1.29*  CALCIUM 9.2  --  8.4*  MG  --  2.0  --     Liver Function Tests: No results for input(s): AST, ALT, ALKPHOS, BILITOT, PROT, ALBUMIN in the last 168 hours. No results for input(s): LIPASE, AMYLASE in the last 168 hours. No results for input(s): AMMONIA in the last 168 hours.  CBC: Recent Labs  Lab 02/18/20 2331  WBC 9.7  HGB 16.4  HCT 51.8  MCV 100.2*  PLT 187    Cardiac Enzymes: No results for input(s): CKTOTAL, CKMB, CKMBINDEX, TROPONINI in the last 168 hours.  BNP: BNP (last 3 results) Recent Labs    02/03/20 1537  BNP 540.6*    ProBNP (last 3 results) No results for input(s): PROBNP in the last 8760 hours.   CBG: No results for input(s): GLUCAP in the last 168 hours.  Coagulation Studies: No results for input(s): LABPROT, INR in the last 72 hours.   Imaging   DG Chest Portable 1 View  Result Date: 02/18/2020 CLINICAL DATA:  life vest chocking him all day shocking him in triage EXAM: PORTABLE CHEST 1 VIEW COMPARISON:  Chest radiograph 02/03/2020 FINDINGS: Overlying artifact from life vest. Mild cardiomegaly. Mediastinal contours are unchanged. No pulmonary edema. No focal airspace disease, pneumothorax, or pleural effusion. Remote right rib fractures again seen. IMPRESSION: Mild cardiomegaly without acute abnormality. Overlying life vest in place. Electronically Signed   By: Narda Rutherford M.D.   On: 02/18/2020 23:51     Medications:     Current Medications: . aspirin EC  81 mg Oral  Daily  .  atorvastatin  80 mg Oral Daily  . Chlorhexidine Gluconate Cloth  6 each Topical Daily  . pantoprazole  40 mg Oral QAC breakfast  . predniSONE  30 mg Oral Q breakfast  . sodium chloride flush  3 mL Intravenous Q12H  . [START ON 02/20/2020] sulfamethoxazole-trimethoprim  1 tablet Oral Once per day on Mon Wed Fri    Infusions: . sodium chloride 10 mL/hr at 02/19/20 1100  . amiodarone 30 mg/hr (02/19/20 1200)      Assessment/Plan   1. 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  - have d/w Dr. Jeralyn Ruths at [facility] and will arrange f/u there with PET scan - ICD implant this admit - Kepp K > 4.0 Mg > 2.0  2. Chronic Systolic HF - Diagnosed 11/2019. Presented with VT. LHC 70% LAD.  - Echo EF 25-30%  - CMRI concerning for sarcoid and EF 18%.   - NYHA II. Volume status ok - GDMT on hold with low BP   3. CAD - LHC 12/07/19 70-% LAD - No s/s angina. Hs trop up to 747 in setting of demand ischemia from VT - Continue statin and aspirin.    Length of Stay: 0  Arvilla Meres, MD  02/19/2020, 12:29 PM  Advanced Heart Failure Team Pager [phone] (M-F; 7a - 4p)  Please contact [facility] for night-coverage after hours (4p -7a ) and weekends on [email or web address]

## 2020-02-19 NOTE — H&P (Signed)
Cardiology Admission History and Physical:   Patient ID: Charles Holmes; MRN: [id number]; DOB: Feb 28, 1965   Admission date: 02/18/2020  Primary Care Provider: [name], PA-C Primary Cardiologist: Garwin Brothers, MD    Chief Complaint:  Palpitations  History of Present Illness:   Charles Holmes is a 55 y.o. male with a history of systolic HF, episodes of VT, NICM, hypertension and GERD, who presented to the hospital with palpitations and worsening fatigue. He denied any chest pain. The symptoms started earlier in the afternoon. In the ED, the patient was documented to be in VT at a rate in the 150s. [name] stated that his Life Vest was alarming often today. He was successfully defibrillated with 120 J x 1. He was also started on IV amiodarone  With respect to his cardiac history, in Nov 2021, the patient was admitted to [facility] with CP. He was found to be in VT. Echo showed EF 25-30%, mild AR/MR/TR. A cath documented 70% prox LAD lesion. No intervention was performed. A Life Vest was then placed on the patient. A cardiac MR in Dec 2021 was highly suggestive of sarcoidosis.  Pertinent testing: Cath 12/16/19 - LAD 70%prox. Otherwise normal cors  cMRI 12/19/19 1. LVEF 18%Diffuse hypokinesis with thinning/akinesis of basal inferoseptum, basal to mid anterior, mid anteroseptum, mid inferolateral, and apical septum 2.There is subendocardial late gadolinium enhancement suggestive of prior infarct, however does not occur in a coronary distribution, as there is circumferential subendocardial LGE at the base along with the mid inferoseptum/anteroseptum/anterior walls and apical septum. In addition there is nonischemic scar pattern with subepicardial LGE in the mid anterior and mid inferior walls. There is also LGE of the papillary muscles. 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 3. Normal RV size with moderate systolic dysfunction (EF  38%)   High Res CT 12/30/19: Not suggestive of pulmonary sarcoid     Past Medical History:  Diagnosis Date  . GERD (gastroesophageal reflux disease)   . Hypertension     Past Surgical History:  Procedure Laterality Date  . RIGHT/LEFT HEART CATH AND CORONARY ANGIOGRAPHY N/A 12/16/2019   Procedure: RIGHT/LEFT HEART CATH AND CORONARY ANGIOGRAPHY;  Surgeon: [place]azil[place], Peter M, MD;  Location: [facility] INVASIVE CV LAB;  Service: Cardiovascular;  Laterality: N/A;     Medications Prior to Admission: Prior to Admission medications   Medication Sig Start Date End Date Taking? Authorizing Provider  apixaban (ELIQUIS) 5 MG TABS tablet Take 1 tablet (5 mg total) by mouth 2 (two) times daily. 02/03/20   Mancel Bale, MD  aspirin EC 81 MG EC tablet Take 1 tablet (81 mg total) by mouth daily. 12/19/19   [place], Peter M, MD  atorvastatin (LIPITOR) 80 MG tablet Take 1 tablet (80 mg total) by mouth daily. 12/19/19   [place], Peter M, MD  dapagliflozin propanediol (FARXIGA) 10 MG TABS tablet Take 1 tablet (10 mg total) by mouth daily. 12/19/19   [place], Peter M, MD  losartan (COZAAR) 25 MG tablet Take 1 tablet (25 mg total) by mouth daily. 12/19/19   [place], Peter M, MD  metoprolol succinate (TOPROL-XL) 100 MG 24 hr tablet Take 0.5 tablets (50 mg total) by mouth daily. Take with or immediately following a meal. Patient taking differently: Take 100 mg by mouth daily. Take with or immediately following a meal. 02/03/20   Mancel Bale, MD  pantoprazole (PROTONIX) 40 MG tablet Take 40 mg by mouth daily before breakfast.    [provider]  spironolactone (ALDACTONE) 25 MG tablet Take 0.5 tablets (12.5 mg total) by mouth daily. 12/19/19   [place], Peter M, MD  VENTOLIN HFA 108 [phone] Base) MCG/ACT inhaler Inhale 2 puffs into the lungs every 6 (six) hours as needed for wheezing or shortness of breath.  08/13/19   [provider]     Allergies:    Allergies  Allergen Reactions  . Percocet  [Oxycodone-Acetaminophen] Itching    Social History:   Social History   Socioeconomic History  . Marital status: Divorced    Spouse name: Not on file  . Number of children: Not on file  . Years of education: Not on file  . Highest education level: Not on file  Occupational History  . Not on file  Tobacco Use  . Smoking status: Former Smoker    Types: Cigarettes  . Smokeless tobacco: Never Used  Vaping Use  . Vaping Use: Unknown  Substance and Sexual Activity  . Alcohol use: Yes    Alcohol/week: 6.0 standard drinks    Types: 6 Cans of beer per week  . Drug use: Never  . Sexual activity: Yes  Other Topics Concern  . Not on file  Social History Narrative  . Not on file   Social Determinants of Health   Financial Resource Strain: Not on file  Food Insecurity: Not on file  Transportation Needs: Not on file  Physical Activity: Not on file  Stress: Not on file  Social Connections: Not on file  Intimate Partner Violence: Not on file     Family History:   The patient's family history includes Heart disease in his father and mother; Hypertension in his father.     Review of Systems: [y] = yes, [ ]  = no   . General: Weight gain [ ] ; Weight loss [ ] ; Anorexia [ ] ; Fatigue [ ] ; Fever [ ] ; Chills [ ] ; Weakness [ ]   . Cardiac: Chest pain/pressure [ ] ; Resting SOB [Y]; Exertional SOB [ ] ; Orthopnea [ ] ; Pedal Edema [ ] ; Palpitations [Y]; Syncope [ ] ; Presyncope [ ] ; Paroxysmal nocturnal dyspnea[ ]   . Pulmonary: Cough [ ] ; Wheezing[ ] ; Hemoptysis[ ] ; Sputum [ ] ; Snoring [ ]   . GI: Vomiting[ ] ; Dysphagia[ ] ; Melena[ ] ; Hematochezia [ ] ; Heartburn[ ] ; Abdominal pain [ ] ; Constipation [ ] ; Diarrhea [ ] ; BRBPR [ ]   . GU: Hematuria[ ] ; Dysuria [ ] ; Nocturia[ ]   . Vascular: Pain in legs with walking [ ] ; Pain in feet with lying flat [ ] ; Non-healing sores [ ] ; Stroke [ ] ; TIA [ ] ; Slurred speech [ ] ;  . Neuro: Headaches[ ] ; Vertigo[ ] ; Seizures[ ] ; Paresthesias[ ] ;Blurred vision [ ] ;  Diplopia [ ] ; Vision changes [ ]   . Ortho/Skin: Arthritis [ ] ; Joint pain [ ] ; Muscle pain [ ] ; Joint swelling [ ] ; Back Pain [ ] ; Rash [ ]   . Psych: Depression[ ] ; Anxiety[ ]   . Heme: Bleeding problems [ ] ; Clotting disorders [ ] ; Anemia [ ]   . Endocrine: Diabetes [ ] ; Thyroid dysfunction[ ]      Physical Exam/Data:   Vitals:   02/19/20 0025 02/19/20 0027 02/19/20 0030 02/19/20 0033  BP: 95/67 (!) 85/62 (!) 81/56 (!) 77/54  Pulse: (!) 56 (!) 55 (!) 55 (!) 55  Resp: 18 16 16  (!) 21  Temp:      TempSrc:      SpO2: 99% 98% 99% 99%  Weight:      Height:  No intake or output data in the 24 hours ending 02/19/20 0040 Filed Weights   02/18/20 2318  Weight: 81.5 kg   Body mass index is 27.32 kg/m.  General:  Ill appearing HEENT: normal Lymph: no adenopathy Neck: mild JVD Endocrine:  No thryomegaly Vascular: No carotid bruits; FA pulses 2+ bilaterally without bruits  Cardiac:  normal S1, S2; tachycardiac Lungs:  clear to auscultation bilaterally, no wheezing, rhonchi or rales  Abd: soft, nontender, no hepatomegaly  Ext: no edema Musculoskeletal:  No deformities, BUE and BLE strength normal and equal Skin: warm and dry  Neuro:  CNs 2-12 intact, no focal abnormalities noted Psych:  Normal affect    Laboratory Data:  ChemistryNo results for input(s): NA, K, CL, CO2, GLUCOSE, BUN, CREATININE, CALCIUM, GFRNONAA, GFRAA, ANIONGAP in the last 168 hours.  No results for input(s): PROT, ALBUMIN, AST, ALT, ALKPHOS, BILITOT in the last 168 hours. Hematology Recent Labs  Lab 02/18/20 2331  WBC 9.7  RBC 5.17  HGB 16.4  HCT 51.8  MCV 100.2*  MCH 31.7  MCHC 31.7  RDW 12.3  PLT 187   Cardiac EnzymesNo results for input(s): TROPONINI in the last 168 hours. No results for input(s): TROPIPOC in the last 168 hours.  BNPNo results for input(s): BNP, PROBNP in the last 168 hours.  DDimer No results for input(s): DDIMER in the last 168 hours.  Radiology/Studies:  DG Chest  Portable 1 View  Result Date: 02/18/2020 CLINICAL DATA:  life vest chocking him all day shocking him in triage EXAM: PORTABLE CHEST 1 VIEW COMPARISON:  Chest radiograph 02/03/2020 FINDINGS: Overlying artifact from life vest. Mild cardiomegaly. Mediastinal contours are unchanged. No pulmonary edema. No focal airspace disease, pneumothorax, or pleural effusion. Remote right rib fractures again seen. IMPRESSION: Mild cardiomegaly without acute abnormality. Overlying life vest in place. Electronically Signed   By: Narda Rutherford M.D.   On: 02/18/2020 23:51    Assessment and Plan:   1. Recurrent Ventricular tachycardia The patient presents to the hospital with recurrent VT. There was a plan for an ICD implant previously but the patient had tested positive for COVID 19 in January and this had to be rescheduled. The patient does report that the Life Vest has been alarming frequently today. In the ED he was documented to be in VT at a rate of 150s. He was successfully defibrillated with 120 J x 1.  -Give Amiodarone IV bolus and start infusion -EP follow-up in the AM -Maintain serum K >4.0 and Mg >2.0 -Trend cardiac enzymes -Hold Eliquis -Hold HF meds due to significant hypotension   Severity of Illness: The appropriate patient status for this patient is INPATIENT. Inpatient status is judged to be reasonable and necessary in order to provide the required intensity of service to ensure the patient's safety. The patient's presenting symptoms, physical exam findings, and initial radiographic and laboratory data in the context of their chronic comorbidities is felt to place them at high risk for further clinical deterioration. Furthermore, it is not anticipated that the patient will be medically stable for discharge from the hospital within 2 midnights of admission. The following factors support the patient status of inpatient.   " The patient's presenting symptoms include palpitations. " The worrisome  physical exam findings include tachycardia. " The initial radiographic and laboratory data are worrisome because of abnormal ECG. " The chronic co-morbidities include sarcoidosis.   * I certify that at the point of admission it is my clinical judgment that the patient will  require inpatient hospital care spanning beyond 2 midnights from the point of admission due to high intensity of service, high risk for further deterioration and high frequency of surveillance required.*    For questions or updates, please contact [facility] Please consult [email or web address] for contact info under Cardiology/STEMI.    Signed, [name], MD  02/19/2020 12:40 AM

## 2020-02-19 NOTE — ED Notes (Signed)
ED Provider at bedside, aware of current vitals. Okay to start Amiodorone gtt at this time.

## 2020-02-19 NOTE — ED Notes (Signed)
Start of Amiodarone delayed initially, pt bp 70's/80's SBP. Dr. Eudelia Bunch made aware for the same. To bedside to eval pt, preparing pt for synchronized cardioversion. Consent for Sedation signed by patient. See sedation narrarator. Cardiology at bedside. Amiodarone gtt started per cardiology.

## 2020-02-19 NOTE — Consult Note (Addendum)
Cardiology Consultation:   Patient ID: Charles Holmes MRN: [id number]; DOB: [birth year]  Admit date: 02/18/2020 Date of Consult: 02/19/2020  Primary Care Provider: [name], PA-C [facility] Cardiologist: Charles Brothers, MD  AHF: Dr. [facility] Electrophysiologist:  Dr. Elberta [facility]   Patient Profile:   Charles Holmes is a 55 y.o. 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 Deforest [facility].  History of Present Illness:   [name] was recently admitted to [facility] [place] 12/15/20 with chest discomfort. He reported a few weeks of progressive fatigue and SOB/DOE and had been recently treated for a PNA. 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  AT [facility] noted in a WCT, he was initially given diltiazem without change, followed by IV lopressor that seemed to convert him to SR, He was seen in consult by Dr. Tomie [facility], his consult note mentions discussing the case with Dr. Ladona [facility] regarding VT and was recommended ischemic eval/cath, and he was transferred to [facility], his echo from [place] noted LVEF 25-30%, no significant VHD.  He underwent cath noting   Prox LAD lesion is 70% stenosed.  LV end diastolic pressure is moderately elevated.  Hemodynamic findings consistent with moderate pulmonary hypertension.  1. Single vessel borderline obstructive disease involving the proximal LAD.  2. Moderately elevated LV filling pressures 3. Moderate pulmonary HTN 4. Preserved cardiac output with index 2.41.  Plan:would focus on optimizing medical therapy. He needs IV diuresis. Will switch lisinopril to losartan with the idea of switching to Entresto in 72 hours. Continue Toprol XL. Consider addition of aldactone. The  degree of LV dysfunction is out of proportion to his CAD. If he does have anginal symptoms after optimizing medical therapy we could consider reevaluation of the proximal LAD for stenting C.MRI was planned, EP consulted with recommendations for GDMT, life vest and outpt follow up on his c.MRI results and discharged 12/19/19.  His c/MRI was abnormal and referred to Charles Holmes and AHF team.  02/03/20 The patient was at the HF clinic for his consult with Charles Holmes and reported that prior to coming in his lofe vest kept alarming, he was feeling OK so he aborted shocks manually. EKG was done and he was in VT 150's, without symptoms. He was brought from the clinic to the ER, EP team alerted and asked to see for recommendations.  Upon arrival to the ER and placed on telemetry he had self converted to SR 60's. He denies any particular symptoms, no exertional intolerances of late.  He aborted Life vest shocks. EKGs further reviewed and there was some question of possible 2:1 atrial rhythm, possibly AFlutter vs VT.  Planned for discharge from the ER with increased metoprolol dose, addition of eliquis and outpt EPS possible ICD implant.  Ultimately though Dr. Elberta Holmes in d/w colleagues felt indeed was VT and planned for ICD implant though his pre-procedure COVID test was positive on 02/09/20 and implant delayed  He was readmitted yesterday/overnight when he came to the hospital with c/o palpitations and worsened fatigue, no CP, reported several life vest alarms. He was sedated and cardioverted in the ER, admitted for further management  Note he reported he thinks he may have  been inadvertently taking 150mg  daily ofh is metoprolol rather then 100mg  This was held with SB Eliquis held for possible implant/procedures  Maintained on amio gtt  LABS K+ 4.5 Mag 2.0 BUN/Creat 22/1.72 (Baseline looks about 1.0) HS Trop 79 > 547 > 747 WBC 9.7 H/H 16/51 Plts 187  The patient tells me he has been  doing quite well.  He has 5 jobs lined up and was hoping to get them all done prior to his ICD implant in march.  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  Mad him feel weak  In the legs, not dizzy or lightheaded, his lifevest alarming intermittently. He tried to keep going though ended up going home [name] he could not find a comfortable position when he would lay down the vest would alarm and he could feel his heart beating, no CP. Eventually after hours he did become more and more SOB with ambulation, legs felt weak and came in. Probably went into VT about 1000 yesterday AM Cardioverted in the ER at about midnight.  Has felt well again since   Past Medical History:  Diagnosis Date  . CAD (coronary artery disease)   . GERD (gastroesophageal reflux disease)   . Hyperlipidemia   . Hypertension   . Systolic heart failure (HCC) 2021   LVEF 18%, RVEF 38% on cardiac MRI 12/19/2019. possible cardiac sarcoidosis.  . Wide-complex tachycardia (HCC) 2021   wears LifeVest    Past Surgical History:  Procedure Laterality Date  . RIGHT/LEFT HEART CATH AND CORONARY ANGIOGRAPHY N/A 12/16/2019   Procedure: RIGHT/LEFT HEART CATH AND CORONARY ANGIOGRAPHY;  Surgeon: 2022, Peter M, MD;  Location: [facility];  Service: Cardiovascular;  Laterality: N/A;     Home Medications:  Prior to Admission medications   Medication Sig Start Date End Date Taking? Authorizing Provider  apixaban (ELIQUIS) 5 MG TABS tablet Take 1 tablet (5 mg total) by mouth 2 (two) times daily. 02/03/20  Yes Charles [facility], MD  aspirin EC 81 MG EC tablet Take 1 tablet (81 mg total) by mouth daily. 12/19/19  Yes Mancel Bale, Peter M, MD  dapagliflozin propanediol (FARXIGA) 10 MG TABS tablet Take 1 tablet (10 mg total) by mouth daily. 12/19/19  Yes [place], Peter M, MD  losartan (COZAAR) 25 MG tablet Take 1 tablet (25 mg  total) by mouth daily. 12/19/19  Yes [place], Peter M, MD  metoprolol succinate (TOPROL-XL) 100 MG 24 hr tablet Take 0.5 tablets (50 mg total) by mouth daily. Take with or immediately following a meal. Patient taking differently: Take 100 mg by mouth daily. Take with or immediately following a meal. 02/03/20  Yes [place], MD  pantoprazole (PROTONIX) 40 MG tablet Take 40 mg by mouth daily before breakfast.   Yes [provider]  spironolactone (ALDACTONE) 25 MG tablet Take 0.5 tablets (12.5 mg total) by mouth daily. 12/19/19  Yes Mancel Bale, Peter M, MD  atorvastatin (LIPITOR) 80 MG tablet Take 1 tablet (80 mg total) by mouth daily. 12/19/19   [place], Peter M, MD  VENTOLIN HFA 108 [phone] Base) MCG/ACT inhaler Inhale 2 puffs into the lungs every 6 (six) hours as needed for wheezing or shortness of breath.  08/13/19   [provider]    Inpatient Medications: Scheduled Meds: . aspirin EC  81 mg Oral Daily  . atorvastatin  80 mg Oral Daily  .  Chlorhexidine Gluconate Cloth  6 each Topical Daily  . pantoprazole  40 mg Oral QAC breakfast   Continuous Infusions: . sodium chloride 125 mL/hr at 02/19/20 0400  . amiodarone 30 mg/hr (02/19/20 0628)   PRN Meds: nitroGLYCERIN, ondansetron (ZOFRAN) IV  Allergies:    Allergies  Allergen Reactions  . Percocet [Oxycodone-Acetaminophen] Itching    Social History:   Social History   Socioeconomic History  . Marital status: Divorced    Spouse name: Not on file  . Number of children: Not on file  . Years of education: Not on file  . Highest education level: Not on file  Occupational History  . Not on file  Tobacco Use  . Smoking status: Former Smoker    Types: Cigarettes  . Smokeless tobacco: Never Used  Vaping Use  . Vaping Use: Unknown  Substance and Sexual Activity  . Alcohol use: Yes    Alcohol/week: 6.0 standard drinks    Types: 6 Cans of beer per week  . Drug use: Yes    Types: Marijuana    Comment: stopped using  months ago d/t how it affected breathing  . Sexual activity: Yes  Other Topics Concern  . Not on file  Social History Narrative  . Not on file   Social Determinants of Health   Financial Resource Strain: Not on file  Food Insecurity: Not on file  Transportation Needs: Not on file  Physical Activity: Not on file  Stress: Not on file  Social Connections: Not on file  Intimate Partner Violence: Not on file    Family History:   Family History  Problem Relation Age of Onset  . Hypertension Father   . Heart disease Father   . Heart disease Mother      ROS:  Please see the history of present illness.  All other ROS reviewed and negative.     Physical Exam/Data:   Vitals:   02/19/20 0500 02/19/20 0530 02/19/20 0600 02/19/20 0630  BP: 99/67 (!) 87/47 109/70 112/82  Pulse: (!) 47 (!) 46 (!) 46 (!) 48  Resp: 13 13 12 12   Temp:      TempSrc:      SpO2: 91% 93% 94% 95%  Weight:      Height:        Intake/Output Summary (Last 24 hours) at 02/19/2020 0733 Last data filed at 02/19/2020 04/18/2020 Gross per 24 hour  Intake 619.75 ml  Output 110 ml  Net 509.75 ml   Last 3 Weights 02/19/2020 02/18/2020 02/03/2020  Weight (lbs) 178 lb 2.1 oz 179 lb 10.8 oz 180 lb  Weight (kg) 80.8 kg 81.5 kg 81.647 kg     Body mass index is 27.08 kg/m.  General:  Well nourished, well developed, in no acute distress HEENT: normal Lymph: no adenopathy Neck: no JVD Endocrine:  No thryomegaly Vascular: No carotid bruits Cardiac:  RRR; no murmurs, gallops or rubs Lungs:  CTA b/l, no wheezing, rhonchi or rales  Abd: soft, nontender, no hepatomegaly  Ext: no edema Musculoskeletal:  No deformities Skin: warm and dry  Neuro:   no focal abnormalities noted Psych:  Normal affect   EKG:  The EKG was personally reviewed and demonstrates:    VT 148bpm SB 59bpm, IVCD, LAD  Telemetry:  Telemetry was personally reviewed and demonstrates:   SB high 40's-50;s   Relevant CV Studies:  12/30/19: High res  CT IMPRESSION: 1. Diffuse bilateral bronchial wall thickening, consistent with nonspecific infectious or inflammatory bronchitis. 2. There  is minimal fine centrilobular nodularity scattered throughout the lungs and very minimal suggestion of very fine perilymphatic nodularity noted along the fissures. There are no definitive findings of mediastinal or pulmonary sarcoidosis at this time, although if the diagnosis of cardiac sarcoidosis is established, consider CT follow-up to assess for change over time. 3. No other evidence of fibrotic interstitial lung disease. 4. Coronary artery disease.   12/19/19: c.MRI IMPRESSION: 1. There is subendocardial late gadolinium enhancement suggestive of prior infarct, however does not occur in a coronary distribution, as there is circumferential subendocardial LGE at the base along with the mid inferoseptum/anteroseptum/anterior walls and apical septum. In addition there is nonischemic scar pattern with subepicardial LGE in the mid anterior and mid inferior walls. There is also LGE of the papillary muscles. 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 In addition, there are elevated T2 values in the basal to apical septum suggesting myocardial edema. Recommend cardiac PET scan for further evaluation of sarcoidosis. Would also recommend chest CT to evaluate for evidence of pulmonary sarcoid.  2. Moderate LV dilation with severe systolic dysfunction (EF 18%). Diffuse hypokinesis with thinning/akinesis of basal inferoseptum, basal to mid anterior, mid anteroseptum, mid inferolateral, and apical septum  3. Normal RV size with moderate systolic dysfunction (EF 38%)   12/16/19: LHC  Prox LAD lesion is 70% stenosed.  LV end diastolic pressure is moderately elevated.  Hemodynamic findings consistent with moderate pulmonary hypertension.  1. Single vessel borderline obstructive disease involving the proximal LAD.   2. Moderately elevated LV filling pressures 3. Moderate pulmonary HTN 4. Preserved cardiac output with index 2.41.   Plan: would focus on optimizing medical therapy. He needs IV diuresis. Will switch lisinopril to losartan with the idea of switching to Entresto in 72 hours. Continue Toprol XL. Consider addition of aldactone. The degree of LV dysfunction is out of proportion to his CAD. If he does have anginal symptoms after optimizing medical therapy we could consider reevaluation of the proximal LAD for stenting. Consider EP evaluation of wide complex tachycardia.    Laboratory Data:  High Sensitivity Troponin:   Recent Labs  Lab 02/03/20 1520 02/03/20 1600 02/18/20 2331 02/19/20 0127  TROPONINIHS 49* 79* 547* 747*     Chemistry Recent Labs  Lab 02/18/20 2331  NA 143  K 4.5  CL 104  CO2 28  GLUCOSE 139*  BUN 22*  CREATININE 1.72*  CALCIUM 9.2  GFRNONAA 46*  ANIONGAP 11    No results for input(s): PROT, ALBUMIN, AST, ALT, ALKPHOS, BILITOT in the last 168 hours. Hematology Recent Labs  Lab 02/18/20 2331  WBC 9.7  RBC 5.17  HGB 16.4  HCT 51.8  MCV 100.2*  MCH 31.7  MCHC 31.7  RDW 12.3  PLT 187   BNPNo results for input(s): BNP, PROBNP in the last 168 hours.  DDimer No results for input(s): DDIMER in the last 168 hours.   Radiology/Studies:  DG Chest Portable 1 View Result Date: 02/18/2020 CLINICAL DATA:  life vest chocking him all day shocking him in triage EXAM: PORTABLE CHEST 1 VIEW COMPARISON:  Chest radiograph 02/03/2020 FINDINGS: Overlying artifact from life vest. Mild cardiomegaly. Mediastinal contours are unchanged. No pulmonary edema. No focal airspace disease, pneumothorax, or pleural effusion. Remote right rib fractures again seen. IMPRESSION: Mild cardiomegaly without acute abnormality. Overlying life vest in place. Electronically Signed   By: Narda RutherfordMelanie  Sanford M.D.   On: 02/18/2020 23:51     Assessment and Plan:   1. Recurrent VT  mildly  (through progressively worse over hours) symptomatic, stable BPs     Cardioverted in the ER     Sinus brady     On amio gtt  ICD implant was delayed 2/2 asymptomatic COVID + test on 02/09/20  2. 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 AKI     Likely 2/2 VT     Repeat, follow     Plan to  Resume outpt aldactone, ARB once stablized  4. SB     Asymptomatic, high 40's--50's     Home BB held  5. CAD     Single v essel CAD 70% LAD     No CP     Abn HS trop likely demand with long episode of VT  6. Pre-op screening COVID + on 02/09/20     Today is day 10     Patient denies any symptoms of illness     Review of CDC guidelines OK to come off precautions day 5, continue to wear mask aaround others an additional 5 days     In d/w cath lab, preferred procedure date would be day 11 >> tomorrow   Dr. Johney Frame has seen and examined the patient Discussed case with Charles Holmes Start prednisone 30mg  daily and PJP prophylaxis  In d/w RPH start bactrim DS on M-W-F (will start tomorrow)  Will continue amiodarone gtt today, implant ICD tomorrow if no more VT   Risk Assessment/Risk Scores:   :      New York Heart Association (NYHA) Functional Class NYHA Class I     For questions or updates, please contact [facility] Please consult [email or web address] for contact info under    Signed, [id number]}, PA-C  02/19/2020 7:33 AM;t    I have seen, examined the patient, and reviewed the above assessment and plan.  Changes to above are made where necessary.  On exam, RRR.  The patient is admitted with sustained VT. He has been started on IV amiodarone. 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  We will plan to start IV steroids and pursue PET  imaging at [facility] when feasible.  At this time, he meets criteria for ICD implantation for secondary prevention of sudden death. He has sinus bradycardia which limits medical therapy and therefore should have a dual chamber device implanted.  I have had a thorough discussion with the patient reviewing options.  The patient has had opportunities to ask questions and have them answered. The patient and I have decided together through a shared decision making process to proceed with dual chamber ICD at this time.    Risks, benefits, alternatives to ICD implantation were discussed in detail with the patient today. The patient understands that the risks include but are not limited to bleeding, infection, pneumothorax, perforation, tamponade, vascular damage, renal failure, MI, stroke, death, inappropriate shocks, and lead dislodgement and wishes to proceed.  We will place on the schedule for Dr [facility] to implant tomorrow.  6 month driving restriction for sustained symptomatic ventricular arrhythmia was also discussed with the patient at length by me today.   Co Sign: Charles Fortis, MD

## 2020-02-19 NOTE — ED Notes (Signed)
ED Provider at bedside for eval. Per EDP, leave life vest on at this time, in addition, place Zoll pads and keep on monitor. Pt is COVID +, updated on visitor policy. Spoke with pt daughter, Dorathy Daft to notify her of plan of care.

## 2020-02-20 ENCOUNTER — Inpatient Hospital Stay (HOSPITAL_COMMUNITY)
Admission: EM | Disposition: A | Discharge status: Home / Self Care | Payer: Self-pay | Source: Home / Self Care | Attending: Cardiology

## 2020-02-20 DIAGNOSIS — I428 Other cardiomyopathies: Secondary | ICD-10-CM

## 2020-02-20 HISTORY — PX: ICD IMPLANT: EP1208

## 2020-02-20 LAB — BASIC METABOLIC PANEL
Anion gap: 9 (ref 5–15)
BUN: 18 mg/dL (ref 6–20)
CO2: 27 mmol/L (ref 22–32)
Calcium: 8.6 mg/dL — ABNORMAL LOW (ref 8.9–10.3)
Chloride: 105 mmol/L (ref 98–111)
Creatinine, Ser: 1.09 mg/dL (ref 0.61–1.24)
GFR, Estimated: >60 mL/min (ref >60)
Glucose, Bld: 107 mg/dL — ABNORMAL HIGH (ref 70–99)
Potassium: 3.8 mmol/L (ref 3.5–5.1)
Sodium: 141 mmol/L (ref 135–145)

## 2020-02-20 LAB — MAGNESIUM: Magnesium: 1.9 mg/dL (ref 1.7–2.4)

## 2020-02-20 SURGERY — ICD IMPLANT

## 2020-02-20 MED ORDER — LIDOCAINE HCL (PF) 1 % IJ SOLN
INTRAMUSCULAR | Status: DC | PRN
  Administered 2020-02-20: 60 mL

## 2020-02-20 MED ORDER — POTASSIUM CHLORIDE CRYS ER 20 MEQ PO TBCR
40.0000 meq | EXTENDED_RELEASE_TABLET | Freq: Once | ORAL | Status: AC
  Administered 2020-02-20: 40 meq via ORAL
  Filled 2020-02-20: qty 2

## 2020-02-20 MED ORDER — FENTANYL CITRATE (PF) 100 MCG/2ML IJ SOLN
INTRAMUSCULAR | Status: DC | PRN
  Administered 2020-02-20 (×2): 25 ug via INTRAVENOUS

## 2020-02-20 MED ORDER — SODIUM CHLORIDE 0.9 % IV SOLN
INTRAVENOUS | Status: AC
  Filled 2020-02-20: qty 2

## 2020-02-20 MED ORDER — HEPARIN (PORCINE) IN NACL 1000-0.9 UT/500ML-% IV SOLN
INTRAVENOUS | Status: DC | PRN
  Administered 2020-02-20: 500 mL

## 2020-02-20 MED ORDER — CEFAZOLIN SODIUM-DEXTROSE 1-4 GM/50ML-% IV SOLN
1.0000 g | Freq: Four times a day (QID) | INTRAVENOUS | Status: AC
  Administered 2020-02-20 – 2020-02-21 (×3): 1 g via INTRAVENOUS
  Filled 2020-02-20 (×3): qty 50

## 2020-02-20 MED ORDER — MIDAZOLAM HCL 5 MG/5ML IJ SOLN
INTRAMUSCULAR | Status: AC
  Filled 2020-02-20: qty 5

## 2020-02-20 MED ORDER — MELATONIN 5 MG PO TABS
5.0000 mg | ORAL_TABLET | Freq: Every evening | ORAL | Status: DC | PRN
  Administered 2020-02-20: 5 mg via ORAL
  Filled 2020-02-20: qty 1

## 2020-02-20 MED ORDER — SODIUM CHLORIDE 0.9 % IV SOLN
250.0000 mL | INTRAVENOUS | Status: DC
  Administered 2020-02-20: 250 mL via INTRAVENOUS

## 2020-02-20 MED ORDER — ACETAMINOPHEN 325 MG PO TABS
650.0000 mg | ORAL_TABLET | Freq: Four times a day (QID) | ORAL | Status: DC | PRN
  Administered 2020-02-20: 650 mg via ORAL
  Filled 2020-02-20: qty 2

## 2020-02-20 MED ORDER — LIDOCAINE HCL (PF) 1 % IJ SOLN
INTRAMUSCULAR | Status: AC
  Filled 2020-02-20: qty 60

## 2020-02-20 MED ORDER — CEFAZOLIN SODIUM-DEXTROSE 2-4 GM/100ML-% IV SOLN
INTRAVENOUS | Status: AC
  Filled 2020-02-20: qty 100

## 2020-02-20 MED ORDER — MIDAZOLAM HCL 5 MG/5ML IJ SOLN
INTRAMUSCULAR | Status: DC | PRN
  Administered 2020-02-20 (×2): 1 mg via INTRAVENOUS

## 2020-02-20 MED ORDER — AMIODARONE HCL 200 MG PO TABS
400.0000 mg | ORAL_TABLET | Freq: Two times a day (BID) | ORAL | Status: DC
  Administered 2020-02-20 – 2020-02-21 (×2): 400 mg via ORAL
  Filled 2020-02-20 (×2): qty 2

## 2020-02-20 MED ORDER — FENTANYL CITRATE (PF) 100 MCG/2ML IJ SOLN
INTRAMUSCULAR | Status: AC
  Filled 2020-02-20: qty 2

## 2020-02-20 MED ORDER — MAGNESIUM SULFATE IN D5W 1-5 GM/100ML-% IV SOLN
1.0000 g | Freq: Once | INTRAVENOUS | Status: AC
  Administered 2020-02-20: 1 g via INTRAVENOUS
  Filled 2020-02-20: qty 100

## 2020-02-20 MED ORDER — LIDOCAINE HCL (PF) 1 % IJ SOLN
INTRAMUSCULAR | Status: AC
  Filled 2020-02-20: qty 30

## 2020-02-20 SURGICAL SUPPLY — 8 items
CABLE SURGICAL S-101-97-12 (CABLE) ×2 IMPLANT
ICD EVERA DR XT MRI DDMB1D4 (ICD Generator) ×1 IMPLANT
LEAD CAPSURE NOVUS 5076-52CM (Lead) ×1 IMPLANT
LEAD SPRINT QUAT SEC 6935M-62 (Lead) ×1 IMPLANT
PAD PRO RADIOLUCENT 2001M-C (PAD) ×2 IMPLANT
SHEATH 7FR PRELUDE SNAP 13 (SHEATH) ×1 IMPLANT
SHEATH 9FR PRELUDE SNAP 13 (SHEATH) ×1 IMPLANT
TRAY PACEMAKER INSERTION (PACKS) ×2 IMPLANT

## 2020-02-20 NOTE — Interval H&P Note (Signed)
History and Physical Interval Note:  02/20/2020 9:41 AM  Charles Holmes  has presented today for surgery, with the diagnosis of VT.  The various methods of treatment have been discussed with the patient and family. After consideration of risks, benefits and other options for treatment, the patient has consented to  Procedure(s): ICD IMPLANT (N/A) as a surgical intervention.  The patient's history has been reviewed, patient examined, no change in status, stable for surgery.  I have reviewed the patient's chart and labs.  Questions were answered to the patient's satisfaction.     Jakolby Sedivy [employer]

## 2020-02-20 NOTE — Discharge Instructions (Signed)
After Your ICD (Implantable Cardiac Defibrillator)  ACTIVITY . Do not lift your arm above shoulder height for 1 week after your procedure. After 7 days, you may progress as below.  . You should remove your sling 24 hours after your procedure, unless otherwise instructed by your provider.     Friday February 27, 2020  Saturday February 28, 2020 Sunday February 29, 2020 Monday March 01, 2020   . Do not lift, push, pull, or carry anything over 10 pounds with the affected arm until 6 weeks (Friday April 02, 2020 ) after your procedure.   . Do NOT DRIVE until you have been seen for your wound check, or as long as instructed by your healthcare provider.   . Ask your healthcare provider when you can go back to work   INCISION/Dressing . If you are on a blood thinner such as Coumadin, Xarelto, Eliquis, Plavix, or Pradaxa please confirm with your provider when this should be resumed.   . Monitor your defibrillator site for redness, swelling, and drainage. Call the device clinic at [phone] if you experience these symptoms or fever/chills.  . If your incision is sealed with Steri-strips or staples, you may shower 10 days after your procedure or when told by your provider. Do not remove the steri-strips or let the shower hit directly on your site. You may wash around your site with soap and water.    Marland Kitchen Avoid lotions, ointments, or perfumes over your incision until it is well-healed.  . You may use a hot tub or a pool AFTER your wound check appointment if the incision is completely closed.  . Your ICD is designed to protect you from life threatening heart rhythms. Because of this, you may receive a shock.   o 1 shock with no symptoms:  Call the office during business hours. o 1 shock with symptoms (chest pain, chest pressure, dizziness, lightheadedness, shortness of breath, overall feeling unwell):  Call 911. o If you experience 2 or more shocks in 24 hours:  Call 911. o If you receive a  shock, you should not drive for 6 months per the [place] DMV IF you receive appropriate therapy from your ICD.   . ICD Alerts:  Some alerts are vibratory and others beep. These are NOT emergencies. Please call our office to let us know. If this occurs at night or on weekends, it can wait until the next business day. Send a remote transmission.  . If your device is capable of reading fluid status (for heart failure), you will be offered monthly monitoring to review this with you.   DEVICE MANAGEMENT . Remote monitoring is used to monitor your ICD from home. This monitoring is scheduled every 91 days by our office. It allows [place] to keep an eye on the functioning of your device to ensure it is working properly. You will routinely see your Electrophysiologist annually (more often if necessary).   . You should receive your ID card for your new device in 4-8 weeks. Keep this card with you at all times once received. Consider wearing a medical alert bracelet or necklace.  . Your ICD  may be MRI compatible. This will be discussed at your next office visit/wound check.  You should avoid contact with strong electric or magnetic fields.    Do not use amateur (ham) radio equipment or electric (arc) welding torches. MP3 player headphones with magnets should not be used. Some devices are safe to use if held at least 12 inches (  30 cm) from your defibrillator. These include power tools, lawn mowers, and speakers. If you are unsure if something is safe to use, ask your health care provider.   When using your cell phone, hold it to the ear that is on the opposite side from the defibrillator. Do not leave your cell phone in a pocket over the defibrillator.   You may safely use electric blankets, heating pads, computers, and microwave ovens.  Call the office right away if:  You have chest pain.  You feel more than one shock.  You feel more short of breath than you have felt before.  You feel more light-headed  than you have felt before.  Your incision starts to open up.  This information is not intended to replace advice given to you by your health care provider. Make sure you discuss any questions you have with your health care provider.

## 2020-02-20 NOTE — Progress Notes (Addendum)
Advanced Heart Failure Rounding Note  PCP-Cardiologist: Garwin Brothers, MD   Subjective:    NSR/ sinus brady in 50s on tele currently w/ PVCs. No further sustained VT  K 3.8  Mg 1.9  AKI resolved, SCr 1.72>>1.29>>1.09 today.   Denies CP and dyspnea.    Objective:   Weight Range: 80.8 kg Body mass index is 27.08 kg/m.   Vital Signs:   Temp:  [97.8 F (36.6 C)-98.2 F (36.8 C)] 97.8 F (36.6 C) (02/03 1458) Pulse Rate:  [44-58] 52 (02/04 0700) Resp:  [12-23] 18 (02/04 0700) BP: (97-127)/(61-79) 121/71 (02/04 0700) SpO2:  [91 %-97 %] 93 % (02/04 0700) Last BM Date: 02/19/20  Weight change: Filed Weights   02/18/20 2318 02/19/20 0300  Weight: 81.5 kg 80.8 kg    Intake/Output:   Intake/Output Summary (Last 24 hours) at 02/20/2020 0718 Last data filed at 02/20/2020 0700 Gross per 24 hour  Intake 1330.52 ml  Output 2200 ml  Net -869.48 ml      Physical Exam    General:  Well appearing, moderately obese. No resp difficulty HEENT: Normal Neck: Supple. JVP . Carotids 2+ bilat; no bruits. No lymphadenopathy or thyromegaly appreciated. Cor: PMI nondisplaced. Regular rate & rhythm. No rubs, gallops or murmurs. + defib pads in place Lungs: slight RUE expiratory wheezing, otherwise clear  Abdomen: Soft, nontender, nondistended. No hepatosplenomegaly. No bruits or masses. Good bowel sounds. Extremities: No cyanosis, clubbing, rash, edema Neuro: Alert & orientedx3, cranial nerves grossly intact. moves all 4 extremities w/o difficulty. Affect pleasant   Telemetry   Sinus brady mid 50s w/ occasional PVCs.  EKG    Sinus brady 50 bpm w/ PVCs  Labs    CBC Recent Labs    02/18/20 2331  WBC 9.7  HGB 16.4  HCT 51.8  MCV 100.2*  PLT 187   Basic Metabolic Panel Recent Labs    38/46/65 0448 02/19/20 0844 02/20/20 0125  NA  --  142 141  K  --  4.3 3.8  CL  --  105 105  CO2  --  30 27  GLUCOSE  --  93 107*  BUN  --  20 18  CREATININE  --  1.29* 1.09   CALCIUM  --  8.4* 8.6*  MG 2.0  --  1.9   Liver Function Tests No results for input(s): AST, ALT, ALKPHOS, BILITOT, PROT, ALBUMIN in the last 72 hours. No results for input(s): LIPASE, AMYLASE in the last 72 hours. Cardiac Enzymes No results for input(s): CKTOTAL, CKMB, CKMBINDEX, TROPONINI in the last 72 hours.  BNP: BNP (last 3 results) Recent Labs    02/03/20 1537  BNP 540.6*    ProBNP (last 3 results) No results for input(s): PROBNP in the last 8760 hours.   D-Dimer No results for input(s): DDIMER in the last 72 hours. Hemoglobin A1C No results for input(s): HGBA1C in the last 72 hours. Fasting Lipid Panel No results for input(s): CHOL, HDL, LDLCALC, TRIG, CHOLHDL, LDLDIRECT in the last 72 hours. Thyroid Function Tests No results for input(s): TSH, T4TOTAL, T3FREE, THYROIDAB in the last 72 hours.  Invalid input(s): FREET3  Other results:   Imaging     No results found.   Medications:     Scheduled Medications: . aspirin EC  81 mg Oral Daily  . atorvastatin  80 mg Oral Daily  . Chlorhexidine Gluconate Cloth  6 each Topical Daily  . gentamicin irrigation  80 mg Irrigation On Call  .  pantoprazole  40 mg Oral QAC breakfast  . predniSONE  30 mg Oral Q breakfast  . sodium chloride flush  3 mL Intravenous Q12H  . sulfamethoxazole-trimethoprim  1 tablet Oral Once per day on Mon Wed Fri     Infusions: . sodium chloride 50 mL/hr at 02/20/20 0601  . sodium chloride    . sodium chloride 50 mL/hr at 02/20/20 0700  . amiodarone 30 mg/hr (02/20/20 0700)  .  ceFAZolin (ANCEF) IV       PRN Medications:  nitroGLYCERIN, ondansetron (ZOFRAN) IV, sodium chloride flush    Assessment/Plan    1. 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  - have d/w Dr. at [facility] and will arrange f/u there with PET scan -  Dual chamber ICD implant today - Kepp K > 4.0 Mg > 2.0  2. Chronic Systolic HF - Diagnosed 11/2019. Presented with VT. LHC 70% LAD  - Echo EF 25-30%  - CMRI concerning for sarcoid and EF 18%.   - NYHA II. Volume status ok - GDMT initially held for low BP and AKI, both now resolved - can restart home losartan, spironolactone and Farxiga    3. CAD - LHC 12/07/19 70-% LAD, no intervention - No s/s angina. Hs trop up to 747 in setting of demand ischemia from VT - Continue statin and aspirin.   4. AKI - likely due to hypoperfusion from VT - resolved, SCr 1.72>>1.29>>1.09 today.  - can restart home losartan, spironolactone and Farxiga    Length of Stay: 1  12/09/19, PA-C  02/20/2020, 7:18 AM  Advanced Heart Failure Team Pager [phone] (M-F; 7a - 4p)  Please contact [facility] for night-coverage after hours (4p -7a ) and weekends on [email or web address]  Patient seen and examined with the above-signed Advanced Practice Provider and/or Housestaff. I personally reviewed laboratory data, imaging studies and relevant notes. I independently examined the patient and formulated the important aspects of the plan. I have edited the note to reflect any of my changes or salient points. I have personally discussed the plan with the patient and/or family.  Feels good. No further VT on IV amio. AKI has resolved.   He is now s/p ICD. Prednisone has been started.  General:  Well appearing. No resp difficulty HEENT: normal Neck: supple. no JVD. Carotids 2+ bilat; no bruits. No lymphadenopathy or thryomegaly appreciated. Cor: PMI nondisplaced. Regular rate & rhythm. No rubs, gallops or murmurs. ICD dressing Lungs: clear Abdomen: soft, nontender, nondistended. No hepatosplenomegaly. No bruits or masses. Good bowel sounds. Extremities: no cyanosis, clubbing, rash, edema Neuro: alert & orientedx3, cranial nerves grossly intact. moves all 4 extremities w/o difficulty. Affect pleasant  VT  quiescent. ICD now in place. Would resume GDMT prior to d/c. We have arranged f/u with Dr. [phone] at [facility]. The HF team will sign off. Please call with questions.   [facility], MD  5:10 PM

## 2020-02-20 NOTE — Plan of Care (Signed)

## 2020-02-20 NOTE — Progress Notes (Addendum)
 Electrophysiology Rounding Note  Patient Name: Charles Holmes Date of Encounter: 02/20/2020  Primary Cardiologist: Rajan R Revankar, MD Electrophysiologist: Dr. Marayah Higdon   Subjective   The patient is doing well today. He is frustrated to be NPO, clear breakfast ordered now.    Answered questions about shocked, programming, and procedure.  Inpatient Medications    Scheduled Meds:  aspirin EC  81 mg Oral Daily   atorvastatin  80 mg Oral Daily   Chlorhexidine Gluconate Cloth  6 each Topical Daily   gentamicin irrigation  80 mg Irrigation On Call   pantoprazole  40 mg Oral QAC breakfast   predniSONE  30 mg Oral Q breakfast   sodium chloride flush  3 mL Intravenous Q12H   sulfamethoxazole-trimethoprim  1 tablet Oral Once per day on Mon Wed Fri   Continuous Infusions:  sodium chloride 50 mL/hr at 02/20/20 0601   sodium chloride     sodium chloride 50 mL/hr at 02/20/20 0632   amiodarone 30 mg/hr (02/20/20 0632)    ceFAZolin (ANCEF) IV     PRN Meds: nitroGLYCERIN, ondansetron (ZOFRAN) IV, sodium chloride flush   Vital Signs    Vitals:   02/20/20 0400 02/20/20 0500 02/20/20 0600 02/20/20 0700  BP: 107/67 112/69 112/78   Pulse: (!) 44 (!) 44 (!) 46 (!) 52  Resp: 13 12 14 18  Temp:      TempSrc:      SpO2: 93% 92% 91% 93%  Weight:      Height:        Intake/Output Summary (Last 24 hours) at 02/20/2020 0701 Last data filed at 02/20/2020 0632 Gross per 24 hour  Intake 1299.39 ml  Output 2200 ml  Net -900.61 ml   Filed Weights   02/18/20 2318 02/19/20 0300  Weight: 81.5 kg 80.8 kg    Physical Exam    GEN- The patient is well appearing, alert and oriented x 3 today.   Head- normocephalic, atraumatic Eyes-  Sclera clear, conjunctiva pink Ears- hearing intact Oropharynx- clear Neck- supple Lungs- Clear to ausculation bilaterally, normal work of breathing Heart- Regular rate and rhythm, no murmurs, rubs or gallops GI- soft, NT, ND, + BS Extremities- no clubbing  or cyanosis. No edema Skin- no rash or lesion Psych- euthymic mood, full affect Neuro- strength and sensation are intact  Labs    CBC Recent Labs    02/18/20 2331  WBC 9.7  HGB 16.4  HCT 51.8  MCV 100.2*  PLT 187   Basic Metabolic Panel Recent Labs    02/19/20 0448 02/19/20 0844 02/20/20 0125  NA  --  142 141  K  --  4.3 3.8  CL  --  105 105  CO2  --  30 27  GLUCOSE  --  93 107*  BUN  --  20 18  CREATININE  --  1.29* 1.09  CALCIUM  --  8.4* 8.6*  MG 2.0  --  1.9   Liver Function Tests No results for input(s): AST, ALT, ALKPHOS, BILITOT, PROT, ALBUMIN in the last 72 hours. No results for input(s): LIPASE, AMYLASE in the last 72 hours. Cardiac Enzymes No results for input(s): CKTOTAL, CKMB, CKMBINDEX, TROPONINI in the last 72 hours.   Telemetry    Sinus brady 50s, occasional PVCs up to bigeminy, no NSVT since yesterday am. (personally reviewed)  Radiology    DG Chest Portable 1 View  Result Date: 02/18/2020 CLINICAL DATA:  life vest chocking him all day shocking him in   triage EXAM: PORTABLE CHEST 1 VIEW COMPARISON:  Chest radiograph 02/03/2020 FINDINGS: Overlying artifact from life vest. Mild cardiomegaly. Mediastinal contours are unchanged. No pulmonary edema. No focal airspace disease, pneumothorax, or pleural effusion. Remote right rib fractures again seen. IMPRESSION: Mild cardiomegaly without acute abnormality. Overlying life vest in place. Electronically Signed   By: Narda Rutherford M.D.   On: 02/18/2020 23:51    Patient Profile     Charles Holmes is a 55 y.o. 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 Deforest Hoyles.  Assessment & Plan    1. Recurrent VT Mildly (through progressively worse over hours) symptomatic, stable BPs Cardioverted in the ER Sinus brady currently in 50s Continue amio gtt for now.  ICD implant was delayed 2/2 asymptomatic COVID + test  on 02/09/20 Explained risks, benefits, and alternatives to ICD implantation, including but not limited to bleeding, infection, pneumothorax, pericardial effusion, lead dislodgement, heart attack, stroke, or death.  Pt verbalized understanding and agrees to proceed.    2. 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  High res CT chest not suggestive of pulm sarcoid Pending PET scan Does not look volume OL HF team following.     3. AKI Resolved.  Can resume aldactone and ARB timing per HF team.    4. SB Asymptomatic, high 40's--50's Home BB held RBBB pattern with QRS > 160   5. CAD Single v essel CAD 70% LAD No CP Abn HS trop likely demand with long episode of VT Stable.   6. Pre-op screening COVID + on 02/09/20 Today is day 11, so per CDC guidelines safe to proceed with ICD with little to no additional precautions, cath lab agrees.  Review of CDC guidelines OK to come off precautions day 5, continue to wear mask around others an additional 5 days  For questions or updates, please contact [facility] Please consult [email or web address] for contact info under Cardiology/STEMI.  Signed, Graciella Freer, PA-C  02/20/2020, 7:01 AM   I have seen and examined this patient with Otilio Saber.  Agree with above, note added to reflect my findings.  On exam, RRR, no murmurs. Plan for ICD today for VT. Currently on amiodarone. Chonita Gadea switch to PO this evening. Continue steroids per HF team.     Nanea Jared M. Reagan Klemz MD 02/20/2020 9:39 AM

## 2020-02-20 NOTE — H&P (View-Only) (Signed)
Electrophysiology Rounding Note  Patient Name: Charles Holmes Date of Encounter: 02/20/2020  Primary Cardiologist: Garwin Brothers, MD Electrophysiologist: Dr. Elberta Fortis   Subjective   The patient is doing well today. He is frustrated to be NPO, clear breakfast ordered now.    Answered questions about shocked, programming, and procedure.  Inpatient Medications    Scheduled Meds:  aspirin EC  81 mg Oral Daily   atorvastatin  80 mg Oral Daily   Chlorhexidine Gluconate Cloth  6 each Topical Daily   gentamicin irrigation  80 mg Irrigation On Call   pantoprazole  40 mg Oral QAC breakfast   predniSONE  30 mg Oral Q breakfast   sodium chloride flush  3 mL Intravenous Q12H   sulfamethoxazole-trimethoprim  1 tablet Oral Once per day on Mon Wed Fri   Continuous Infusions:  sodium chloride 50 mL/hr at 02/20/20 0601   sodium chloride     sodium chloride 50 mL/hr at 02/20/20 5397   amiodarone 30 mg/hr (02/20/20 0632)    ceFAZolin (ANCEF) IV     PRN Meds: nitroGLYCERIN, ondansetron (ZOFRAN) IV, sodium chloride flush   Vital Signs    Vitals:   02/20/20 0400 02/20/20 0500 02/20/20 0600 02/20/20 0700  BP: 107/67 112/69 112/78   Pulse: (!) 44 (!) 44 (!) 46 (!) 52  Resp: 13 12 14 18   Temp:      TempSrc:      SpO2: 93% 92% 91% 93%  Weight:      Height:        Intake/Output Summary (Last 24 hours) at 02/20/2020 0701 Last data filed at 02/20/2020 04/19/2020 Gross per 24 hour  Intake 1299.39 ml  Output 2200 ml  Net -900.61 ml   Filed Weights   02/18/20 2318 02/19/20 0300  Weight: 81.5 kg 80.8 kg    Physical Exam    GEN- The patient is well appearing, alert and oriented x 3 today.   Head- normocephalic, atraumatic Eyes-  Sclera clear, conjunctiva pink Ears- hearing intact Oropharynx- clear Neck- supple Lungs- Clear to ausculation bilaterally, normal work of breathing Heart- Regular rate and rhythm, no murmurs, rubs or gallops GI- soft, NT, ND, + BS Extremities- no clubbing  or cyanosis. No edema Skin- no rash or lesion Psych- euthymic mood, full affect Neuro- strength and sensation are intact  Labs    CBC Recent Labs    02/18/20 2331  WBC 9.7  HGB 16.4  HCT 51.8  MCV 100.2*  PLT 187   Basic Metabolic Panel Recent Labs    04/17/20 0448 02/19/20 0844 02/20/20 0125  NA  --  142 141  K  --  4.3 3.8  CL  --  105 105  CO2  --  30 27  GLUCOSE  --  93 107*  BUN  --  20 18  CREATININE  --  1.29* 1.09  CALCIUM  --  8.4* 8.6*  MG 2.0  --  1.9   Liver Function Tests No results for input(s): AST, ALT, ALKPHOS, BILITOT, PROT, ALBUMIN in the last 72 hours. No results for input(s): LIPASE, AMYLASE in the last 72 hours. Cardiac Enzymes No results for input(s): CKTOTAL, CKMB, CKMBINDEX, TROPONINI in the last 72 hours.   Telemetry    Sinus brady 50s, occasional PVCs up to bigeminy, no NSVT since yesterday am. (personally reviewed)  Radiology    DG Chest Portable 1 View  Result Date: 02/18/2020 CLINICAL DATA:  life vest chocking him all day shocking him in  triage EXAM: PORTABLE CHEST 1 VIEW COMPARISON:  Chest radiograph 02/03/2020 FINDINGS: Overlying artifact from life vest. Mild cardiomegaly. Mediastinal contours are unchanged. No pulmonary edema. No focal airspace disease, pneumothorax, or pleural effusion. Remote right rib fractures again seen. IMPRESSION: Mild cardiomegaly without acute abnormality. Overlying life vest in place. Electronically Signed   By: Narda Rutherford M.D.   On: 02/18/2020 23:51    Patient Profile     Charles Holmes is a 55 y.o. 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 Deforest Hoyles.  Assessment & Plan    1. Recurrent VT Mildly (through progressively worse over hours) symptomatic, stable BPs Cardioverted in the ER Sinus brady currently in 50s Continue amio gtt for now.  ICD implant was delayed 2/2 asymptomatic COVID + test  on 02/09/20 Explained risks, benefits, and alternatives to ICD implantation, including but not limited to bleeding, infection, pneumothorax, pericardial effusion, lead dislodgement, heart attack, stroke, or death.  Pt verbalized understanding and agrees to proceed.    2. 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  High res CT chest not suggestive of pulm sarcoid Pending PET scan Does not look volume OL HF team following.     3. AKI Resolved.  Can resume aldactone and ARB timing per HF team.    4. SB Asymptomatic, high 40's--50's Home BB held RBBB pattern with QRS > 160   5. CAD Single v essel CAD 70% LAD No CP Abn HS trop likely demand with long episode of VT Stable.   6. Pre-op screening COVID + on 02/09/20 Today is day 11, so per CDC guidelines safe to proceed with ICD with little to no additional precautions, cath lab agrees.  Review of CDC guidelines OK to come off precautions day 5, continue to wear mask around others an additional 5 days  For questions or updates, please contact [facility] Please consult [email or web address] for contact info under Cardiology/STEMI.  Signed, Graciella Freer, PA-C  02/20/2020, 7:01 AM   I have seen and examined this patient with Otilio Saber.  Agree with above, note added to reflect my findings.  On exam, RRR, no murmurs. Plan for ICD today for VT. Currently on amiodarone. Azuree Minish switch to PO this evening. Continue steroids per HF team.     Deneen Slager M. Jaylean Buenaventura MD 02/20/2020 9:39 AM

## 2020-02-21 ENCOUNTER — Inpatient Hospital Stay (HOSPITAL_COMMUNITY): Payer: BLUE CROSS/BLUE SHIELD

## 2020-02-21 DIAGNOSIS — I5022 Chronic systolic (congestive) heart failure: Secondary | ICD-10-CM

## 2020-02-21 LAB — BASIC METABOLIC PANEL
Anion gap: 9 (ref 5–15)
BUN: 13 mg/dL (ref 6–20)
CO2: 27 mmol/L (ref 22–32)
Calcium: 8.5 mg/dL — ABNORMAL LOW (ref 8.9–10.3)
Chloride: 106 mmol/L (ref 98–111)
Creatinine, Ser: 1.19 mg/dL (ref 0.61–1.24)
GFR, Estimated: >60 mL/min (ref >60)
Glucose, Bld: 85 mg/dL (ref 70–99)
Potassium: 4.7 mmol/L (ref 3.5–5.1)
Sodium: 142 mmol/L (ref 135–145)

## 2020-02-21 MED ORDER — SULFAMETHOXAZOLE-TRIMETHOPRIM 800-160 MG PO TABS: 1.0000 | ORAL_TABLET | ORAL | 5 refills | Status: DC

## 2020-02-21 MED ORDER — AMIODARONE HCL 200 MG PO TABS: ORAL_TABLET | ORAL | 0 refills | Status: DC

## 2020-02-21 MED ORDER — PREDNISONE 10 MG PO TABS: 30.0000 mg | ORAL_TABLET | Freq: Every day | ORAL | 5 refills | Status: DC

## 2020-02-21 NOTE — Discharge Summary (Signed)
ELECTROPHYSIOLOGY PROCEDURE DISCHARGE SUMMARY    Patient ID: Charles Holmes,  MRN: [id number], DOB/AGE: March 13, 1965 55 y.o.  Admit date: 02/18/2020 Discharge date: 02/21/2020  Primary Care Physician: [name], PA-C Primary Cardiologist: Dr. Dulce Sellar Electrophysiologist: Will Jorja Loa, MD  Primary Discharge Diagnosis:  Recurrent Ventricular Tachycardia  Secondary Discharge Diagnosis:  Chronic Systolic CHF CAD AKI  Allergies  Allergen Reactions  . Percocet [Oxycodone-Acetaminophen] Itching     Procedures This Admission:  1.  Implantation of a Medtronic Evera MRI XT DT SureScan (serial number [id number]) ICD by Dr. Elberta Fortis. The patient received a Medtronic model [id number] (serial # [id number]) right atrial lead and a Medtronic model [id number] (serial number [id number]) right ventricular defibrillator lead. There were no immediate post procedure complications.  2.  CXR on 02/21/2020 demonstrated no pneumothorax status post device implantation.   Brief HPI: Charles Holmes is a 56 y.o. male with past medical history of CAD (s/p recent NSTEMI in 12/2019 with 70% proximal LAD stenosis and medical management recommended), chronic systolic CHF/mixed cardiomyopathy (EF 25-30% by echo in 12/2019 and cardiac MRI concerning for sarcoidosis), VT, HTN, and HLD who presented to [facility] ED on 02/18/2020 due to his Life Vest shocking him throughout the day.   He reported palpitations and fatigue but denied any chest pain. He was defibrillated at 120 J on the day of admission and started on IV Amiodarone. Was previously suppose to have ICD placement in 01/2020 but this was delayed given his COVID positive test on 02/09/2020.   He was evaluated by EP on admission and risks, benefits, and alternatives to ICD implantation were reviewed with the patient and he wished to proceed.   Hospital Course:  The patient was admitted and underwent implantation of a Medtronic ICD on 02/20/2020 with details as  outlined above. He was monitored on telemetry overnight which demonstrated sinus bradycardia with occasional PVC's and bigeminy but no NSVT. Left chest was without hematoma or ecchymosis.  The device was interrogated and found to be functioning normally. CXR was obtained and demonstrated no pneumothorax status post device implantation. Wound care, arm mobility, and restrictions were reviewed with the patient. IV Amiodarone was transitioned to PO Amiodarone 400mg  BID and it was recommended to continue this for 7 days then reduce to 400mg  once daily. Dr. also recommended resuming Eliquis on 02/25/2020 (previously on this due to concerns of atrial flutter by review of notes).   He was followed by Advanced Heart Failure during admission as well and was restarted on his home Losartan, Spironolactone and Farxiga which were initially held due to his AKI. Creatinine peaked at 1.72 during admission but was stable at 1.19 on the day of discharge. His prior to admission Toprol-XL was held at discharge due to bradycardia. It was also recommended by AHF he be on Prednisone 30mg  daily and Bactrim for PJP prophylaxis. By review of notes, he is to follow-up with Dr. Lalla Brothers at [facility].   The patient was examined by Dr. and considered stable for discharge to home. Follow-up appointments with EP have been arranged.    Vitals at Discharge: Vitals:   02/21/20 0600 02/21/20 0615 02/21/20 0719 02/21/20 0721  BP: 117/73 129/87  115/70  Pulse: (!) 59 67  76  Resp: 20 19  (!) 23  Temp:   98.1 F (36.7 C)   TempSrc:   Oral   SpO2: 98% 99%  91%  Weight: 79.6 kg     Height:  Labs:   Lab Results  Component Value Date   WBC 9.7 02/18/2020   HGB 16.4 02/18/2020   HCT 51.8 02/18/2020   MCV 100.2 (H) 02/18/2020   PLT 187 02/18/2020    Recent Labs  Lab 02/21/20 0628  NA 142  K 4.7  CL 106  CO2 27  BUN 13  CREATININE 1.19  CALCIUM 8.5*  GLUCOSE 85    Discharge Medications:   Allergies as of 02/21/2020      Reactions   Percocet [oxycodone-acetaminophen] Itching      Medication List    STOP taking these medications   metoprolol succinate 100 MG 24 hr tablet Commonly known as: TOPROL-XL     TAKE these medications   amiodarone 200 MG tablet Commonly known as: PACERONE Take 2 tablets (400 mg total) by mouth 2 (two) times daily for 7 days, THEN 1 tablet (200 mg total) 2 (two) times daily. Start taking on: February 21, 2020   apixaban 5 MG Tabs tablet Commonly known as: ELIQUIS Take 1 tablet (5 mg total) by mouth 2 (two) times daily. Notes to patient: RESTART ON 02/25/2020.   aspirin 81 MG EC tablet Take 1 tablet (81 mg total) by mouth daily.   atorvastatin 80 MG tablet Commonly known as: LIPITOR Take 1 tablet (80 mg total) by mouth daily.   dapagliflozin propanediol 10 MG Tabs tablet Commonly known as: Farxiga Take 1 tablet (10 mg total) by mouth daily.   losartan 25 MG tablet Commonly known as: COZAAR Take 1 tablet (25 mg total) by mouth daily.   pantoprazole 40 MG tablet Commonly known as: PROTONIX Take 40 mg by mouth daily before breakfast.   predniSONE 10 MG tablet Commonly known as: DELTASONE Take 3 tablets (30 mg total) by mouth daily with breakfast.   spironolactone 25 MG tablet Commonly known as: ALDACTONE Take 0.5 tablets (12.5 mg total) by mouth daily.   sulfamethoxazole-trimethoprim 800-160 MG tablet Commonly known as: BACTRIM DS Take 1 tablet by mouth 3 (three) times a week. Start taking on: February 23, 2020   Ventolin HFA 108 (90 Base) MCG/ACT inhaler Generic drug: albuterol Inhale 2 puffs into the lungs every 6 (six) hours as needed for wheezing or shortness of breath.       Disposition:  Discharge Instructions    Diet - low sodium heart healthy   Complete by: As directed    Increase activity slowly   Complete by: As directed    Remove dressing in 24 hours   Complete by: As directed       Follow-up  Information    Regan Lemming, MD Follow up.   Specialty: Cardiology Why: 05/31/20 @ 9:30AM Contact information: [address] [place] [address] [phone]        Marily Lente, NP Follow up.   Specialty: Cardiology Why: 03/02/20 @ 10:20AM, wound check and hospital follow up visit for Dr. Crist Infante information: [address] [place] [address] [phone]               Duration of Discharge Encounter: Greater than 30 minutes including physician time.  Signed, Ellsworth Lennox, PA-C 02/21/2020, 9:14 AM Pager: [phone]

## 2020-02-21 NOTE — Progress Notes (Signed)
Electrophysiology Rounding Note  Patient Name: Charles Holmes Date of Encounter: 02/21/2020  Primary Cardiologist: Garwin Brothers, MD Electrophysiologist: Dr. Elberta Fortis   Subjective   NAEO. Post iCD implant yesterday. CXR shows stable lead position and device interrogation shows stable device function.  Inpatient Medications    Scheduled Meds: . amiodarone  400 mg Oral BID  . aspirin EC  81 mg Oral Daily  . atorvastatin  80 mg Oral Daily  . Chlorhexidine Gluconate Cloth  6 each Topical Daily  . pantoprazole  40 mg Oral QAC breakfast  . predniSONE  30 mg Oral Q breakfast  . sodium chloride flush  3 mL Intravenous Q12H  . sulfamethoxazole-trimethoprim  1 tablet Oral Once per day on Mon Wed Fri   Continuous Infusions: . sodium chloride 50 mL/hr at 02/20/20 0601  .  ceFAZolin (ANCEF) IV Stopped (02/21/20 0250)   PRN Meds: acetaminophen, melatonin, nitroGLYCERIN, ondansetron (ZOFRAN) IV   Vital Signs    Vitals:   02/21/20 0600 02/21/20 0615 02/21/20 0719 02/21/20 0721  BP: 117/73 129/87  115/70  Pulse: (!) 59 67  76  Resp: 20 19  (!) 23  Temp:   98.1 F (36.7 C)   TempSrc:   Oral   SpO2: 98% 99%  91%  Weight: 79.6 kg     Height:        Intake/Output Summary (Last 24 hours) at 02/21/2020 0809 Last data filed at 02/21/2020 0600 Gross per 24 hour  Intake 642.29 ml  Output 1450 ml  Net -807.71 ml   Filed Weights   02/18/20 2318 02/19/20 0300 02/21/20 0600  Weight: 81.5 kg 80.8 kg 79.6 kg    Physical Exam    GEN- The patient is well appearing, alert and oriented x 3 today.   Head- normocephalic, atraumatic Eyes-  Sclera clear, conjunctiva pink Ears- hearing intact Oropharynx- clear Neck- supple Lungs- Clear to ausculation bilaterally, normal work of breathing Heart- Regular rate and rhythm, no murmurs, rubs or gallops. ICD incision nontender. No hematoma. GI- soft, NT, ND, + BS Extremities- no clubbing or cyanosis. No edema Skin- no rash or lesion Psych-  euthymic mood, full affect Neuro- strength and sensation are intact  Labs    CBC Recent Labs    02/18/20 2331  WBC 9.7  HGB 16.4  HCT 51.8  MCV 100.2*  PLT 187   Basic Metabolic Panel Recent Labs    56/43/32 0448 02/19/20 0844 02/20/20 0125 02/21/20 0628  NA  --    < > 141 142  K  --    < > 3.8 4.7  CL  --    < > 105 106  CO2  --    < > 27 27  GLUCOSE  --    < > 107* 85  BUN  --    < > 18 13  CREATININE  --    < > 1.09 1.19  CALCIUM  --    < > 8.6* 8.5*  MG 2.0  --  1.9  --    < > = values in this interval not displayed.   Liver Function Tests No results for input(s): AST, ALT, ALKPHOS, BILITOT, PROT, ALBUMIN in the last 72 hours. No results for input(s): LIPASE, AMYLASE in the last 72 hours. Cardiac Enzymes No results for input(s): CKTOTAL, CKMB, CKMBINDEX, TROPONINI in the last 72 hours.   Telemetry    Sinus brady 50s, occasional PVCs up to bigeminy, no NSVT since yesterday am. (personally reviewed)  Radiology    DG Chest 2 View  Result Date: 02/21/2020 CLINICAL DATA:  Pacemaker placement EXAM: CHEST - 2 VIEW COMPARISON:  Three days ago FINDINGS: Cardiomegaly which is stable. Dual-chamber pacer/ICD leads from the left with leads overlapping the right atrial appendage and right ventricle. No pneumothorax or mediastinal widening. Chronic hyperinflation. Remote right rib fractures IMPRESSION: No acute finding after pacer/defibrillator implant. Electronically Signed   By: Marnee Spring M.D.   On: 02/21/2020 08:01   EP PPM/ICD IMPLANT  Result Date: 02/20/2020 SURGEON:  Loman Brooklyn, MD    PREPROCEDURE DIAGNOSES:  1. Nonischemic cardiomyopathy.  2. New York Heart Association class II, heart failure chronically.  3. Ventricular tachycardia  POSTPROCEDURE DIAGNOSES:  1. Nonischemic cardiomyopathy.  2. New York Heart Association class II heart failure chronically.  3. Ventricular tachycardia    PROCEDURES:   1. ICD implantation.  INTRODUCTION:  Charles Holmes is a 55  y.o. male with an ischemic CM (EF 30%), NYHA Class II CHF, and CAD. At this time, he meets MADIT II/ SCD-HeFT criteria for ICD implantation for primary prevention of sudden death.  The patient has a narrow QRS and does not meet criteria for revascularization.  The patient has been treated with an optimal medical regimen but continues to have a depressed ejection fraction and NYHA Class II CHF symptoms.  The patient therefore  presents today for ICD implantation.    DESCRIPTION OF PROCEDURE:  Informed written consent was obtained and the patient was brought to the electrophysiology lab in the fasting state. The patient was adequately sedated with intravenous Versed, and fentanyl as outlined in the nursing report.  The patient's left chest was prepped and draped in the usual sterile fashion by the EP lab staff.  The skin overlying the left deltopectoral region was infiltrated with lidocaine for local analgesia.  A 5-cm incision was made over the left deltopectoral region.  A left subcutaneous defibrillator pocket was fashioned using a combination of sharp and blunt dissection.  Electrocautery was used to assure hemostasis. Left Upper extremity Venography:  A venogram of the left upper extremity was performed which revealed a moderate sized left axillary vein which emptied into a moderate sized left subclavian vein.  RA/RV Lead Placement: The left axillary vein was cannulated with fluoroscopic visualization.  Through the left axillary vein, a Medtronic model [id number]  (serial # [id number]  ) right atrial lead and a Medtronic, model [id number] (serial number [id number]) right ventricular defibrillator lead were advanced with fluoroscopic visualization into the right atrial appendage and right ventricular apex positions respectively.  Initial atrial lead P-waves measured 2.1 mV with an impedance of 664 ohms and a threshold of 1 volts at 0.5 milliseconds.  The right ventricular lead R-wave measured 5.7 mV with impedance of 559 ohms  and a threshold of 1.1 volts at 0.5 milliseconds. The leads were secured to the pectoralis  fascia using #2 silk suture over the suture sleeves.  The pocket then  irrigated with copious gentamicin solution.  The leads were then  connected to a Medtronic Evera MRI XT DT SureScan (serial  Number [id number]) ICD.  The defibrillator was placed into the  pocket.  The pocket was then closed in 3 layers with 2.0 Vicryl suture for the subcutaneous and 3.0 Vicryl suture subcuticular layers. EBL<21ml.  Steri-Strips and a  sterile dressing were then applied.  CONCLUSIONS:  1. Ischemic cardiomyopathy with chronic New York Heart Association class III heart failure.  2. Successful ICD implantation.  3. No  early apparent complications. Will Jorja Loa, MD 02/20/2020 4:30 PM   Patient Profile     Charles Holmes is a 55 y.o. 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 Deforest Hoyles.  Assessment & Plan    1. Recurrent VT Cardioverted in the ER Now post ICD implant Continue amiodarone PO   Discussed driving restrictions (6 months) with the patient this AM given his recurrent VT and he expressed understanding.  2. 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  High res CT chest not suggestive of pulm sarcoid Pending PET scan - prednisone 30mg  PO daily   3. AKI Resolved.  Can resume aldactone and ARB timing per HF team.    4. SB Asymptomatic, high 40's--50's Home BB held RBBB pattern with QRS > 160   5. CAD Single v essel CAD 70% LAD No CP Abn HS trop likely demand with long episode of VT Stable.  For questions or updates, please contact [facility] Please consult [email or web address] for contact info under Cardiology/STEMI.  Signed, , MD  02/21/2020, 8:09 AM

## 2020-02-23 ENCOUNTER — Encounter (HOSPITAL_COMMUNITY): Payer: Self-pay | Admitting: Cardiology

## 2020-02-23 ENCOUNTER — Telehealth: Payer: Self-pay

## 2020-02-23 MED FILL — Lidocaine HCl Local Preservative Free (PF) Inj 1%: INTRAMUSCULAR | Qty: 30 | Status: AC

## 2020-02-23 NOTE — Telephone Encounter (Signed)
Patient called in stating he just got implanted and he states the lower part of his left arm is swollen and he wants to know should he be concerned. I let patient know a nurse will call him back

## 2020-02-23 NOTE — Telephone Encounter (Signed)
ICD was implanted on 02/20/20. Patient reports of increased swelling in left arm starting at left elbow extended to left hand. Denies any pain, redness or warmth.  Reports he has had left arm elevated and appears to improve. Advised patient this cam occur after a new device is placed.Advised patient to continue with elevation and call with new changes or worsening. Advised I will forward to Dr. Elberta Fortis and will call with any changes. Verbalized understanding.

## 2020-02-27 NOTE — Telephone Encounter (Signed)
Dr. Elberta Fortis recommends following up with pt, if still having issues he should be seen by device clinic.

## 2020-02-27 NOTE — Telephone Encounter (Signed)
Patient reports that he has no edema in LUE at this time and that he is doing much better. Confirmed 03/02/20 appointment with Glory Buff NP .

## 2020-03-02 ENCOUNTER — Other Ambulatory Visit: Payer: Self-pay

## 2020-03-02 ENCOUNTER — Ambulatory Visit: Payer: BLUE CROSS/BLUE SHIELD | Admitting: Nurse Practitioner

## 2020-03-02 DIAGNOSIS — D8685 Sarcoid myocarditis: Secondary | ICD-10-CM | POA: Insufficient documentation

## 2020-03-02 DIAGNOSIS — Z0279 Encounter for issue of other medical certificate: Secondary | ICD-10-CM

## 2020-03-02 DIAGNOSIS — I5022 Chronic systolic (congestive) heart failure: Secondary | ICD-10-CM | POA: Diagnosis not present

## 2020-03-02 DIAGNOSIS — I2510 Atherosclerotic heart disease of native coronary artery without angina pectoris: Secondary | ICD-10-CM | POA: Insufficient documentation

## 2020-03-02 LAB — CUP PACEART INCLINIC DEVICE CHECK
Battery Remaining Longevity: 125 mo
Battery Voltage: 3.13 V
Brady Statistic AP VP Percent: 0.02 %
Brady Statistic AP VS Percent: 36.1 %
Brady Statistic AS VP Percent: 0.05 %
Brady Statistic AS VS Percent: 63.82 %
Brady Statistic RA Percent Paced: 33.4 %
Brady Statistic RV Percent Paced: 0.07 %
Date Time Interrogation Session: 20220215113038
HighPow Impedance: 71 Ohm
Implantable Lead Implant Date: 20220204
Implantable Lead Implant Date: 20220204
Implantable Lead Location: 753859
Implantable Lead Location: 753860
Implantable Lead Model: 5076
Implantable Pulse Generator Implant Date: 20220204
Lead Channel Impedance Value: 342 Ohm
Lead Channel Impedance Value: 437 Ohm
Lead Channel Impedance Value: 475 Ohm
Lead Channel Pacing Threshold Amplitude: 0.625 V
Lead Channel Pacing Threshold Amplitude: 1.375 V
Lead Channel Pacing Threshold Pulse Width: 0.4 ms
Lead Channel Pacing Threshold Pulse Width: 0.4 ms
Lead Channel Sensing Intrinsic Amplitude: 10 mV
Lead Channel Sensing Intrinsic Amplitude: 5.125 mV
Lead Channel Sensing Intrinsic Amplitude: 5.5 mV
Lead Channel Sensing Intrinsic Amplitude: 9.125 mV
Lead Channel Setting Pacing Amplitude: 3.5 V
Lead Channel Setting Pacing Amplitude: 3.5 V
Lead Channel Setting Pacing Pulse Width: 0.4 ms
Lead Channel Setting Sensing Sensitivity: 0.3 mV

## 2020-03-02 NOTE — Progress Notes (Signed)
Wound check appointment Wound well healed, normal device function See Pace[facility] report

## 2020-03-04 ENCOUNTER — Telehealth: Payer: Self-pay | Admitting: Cardiology

## 2020-03-04 ENCOUNTER — Ambulatory Visit: Payer: BLUE CROSS/BLUE SHIELD

## 2020-03-04 NOTE — Telephone Encounter (Addendum)
Left message for the pt to call back.

## 2020-03-04 NOTE — Telephone Encounter (Signed)
Charles Holmes is calling stating he had a PAT scan performed at [facility] on 03/01/20 and they advised him they would be faxing over the results. He is wanting to confirm they were received and have a nurse call him to give the results. Please advise.

## 2020-03-09 NOTE — Telephone Encounter (Signed)
Pt called in returning [facility] call   Best number [phone]

## 2020-03-10 ENCOUNTER — Telehealth: Payer: Self-pay | Admitting: Cardiology

## 2020-03-10 NOTE — Telephone Encounter (Signed)
Pt c/o medication issue:  1. Name of Medication: predniSONE (DELTASONE) 10 MG tablet  2. How are you currently taking this medication (dosage and times per day)? 3 tablets every morning  3. Are you having a reaction (difficulty breathing--STAT)? no  4. What is your medication issue? Patient would like to know how long he will need to stay on the medication.

## 2020-03-11 NOTE — Telephone Encounter (Signed)
Pt advised to continue Prednisone per Dr. Gala Romney. Pt asking about follow up in their office that has not been scheduled.  Aware I will forward to [facility] RN to address. Pt understands their office will call back on this matter.

## 2020-03-11 NOTE — Telephone Encounter (Signed)
Dr. Elberta Fortis recommends Dr. Prescott Gum office advise as medication started per their advisement. Charles Holmes made aware that I will forward this to Dr. Gala Romney and his nurse to address. He wants them to know his PCP did blood work last week and his WBC was elevated, they attributed this to the Prednisone usage. Charles Holmes advised to continue medication until such time as he hears from the [facility] office.  Charles Holmes is agreeable to plan.

## 2020-03-11 NOTE — Telephone Encounter (Signed)
Continue prednisone. Will be on for months until sarcoid settles down.

## 2020-03-11 NOTE — Telephone Encounter (Signed)
Forwarding to Dr. Elberta Fortis for review/advisement.  Dr. Elberta Fortis should he stay on it until f/u w/ you in May w/ PET scan findings concerning for sarcoidosis?

## 2020-03-12 NOTE — Telephone Encounter (Signed)
Messages to schedulers to arrange f/u

## 2020-03-15 ENCOUNTER — Telehealth (HOSPITAL_COMMUNITY): Payer: Self-pay | Admitting: Vascular Surgery

## 2020-03-15 ENCOUNTER — Other Ambulatory Visit: Payer: Self-pay | Admitting: Student

## 2020-03-15 NOTE — Telephone Encounter (Signed)
Pt called to get PET scan results.. please advise

## 2020-03-22 ENCOUNTER — Other Ambulatory Visit: Payer: Self-pay | Admitting: Cardiology

## 2020-03-22 ENCOUNTER — Ambulatory Visit: Payer: BLUE CROSS/BLUE SHIELD | Admitting: Cardiology

## 2020-03-22 ENCOUNTER — Telehealth: Payer: Self-pay | Admitting: Cardiology

## 2020-03-22 NOTE — Telephone Encounter (Addendum)
Eliquis 5mg  refill request received. Patient is 55 years old, weight-79.6kg, Crea-1.19 on 02/21/20, Diagnosis-recurrent vtach, per hospital d/c note on 02/21/20:  Dr. 04/20/20 also recommended resuming Eliquis on 02/25/2020 (previously on this due to concerns of atrial flutter by review of notes), and last seen by Dr. 04/24/2020 on 02/03/20 and taken to ER for treatment and Amber on 03/02/20 for wound check and pending Bensimhon in 04/2020. Dose is appropriate based on dosing criteria. Will send in refill to requested pharmacy; approved by 05/2020, Pharm D to send in a short supply until appt.

## 2020-03-22 NOTE — Telephone Encounter (Signed)
Forms completed by Dr. Elberta Fortis and faxed both forms to [employer] and [employer] on 03/22/20 JG

## 2020-04-05 ENCOUNTER — Telehealth: Payer: Self-pay | Admitting: Cardiology

## 2020-04-05 NOTE — Telephone Encounter (Signed)
   Thank you for the question. I discussed with Dr Gala Romney.   Would like to continue prednisone for sarcoid.   Could the tremor be due to amiodarone?    Thank you,  Ranald Alessio NP-C

## 2020-04-05 NOTE — Telephone Encounter (Signed)
Pt advised to continue taking Prednisone. Discussed Amiodarone w/ Dr. Elberta Fortis who advises pt decrease Amiodarone to 1 tablet daily to see if improvement. Pt advised and agreeable to plan. He will let me know if no improvement by next week. Patient verbalized understanding and agreeable to plan.

## 2020-04-05 NOTE — Telephone Encounter (Signed)
Pt c/o medication issue:  1. Name of Medication: predniSONE (DELTASONE) 10 MG tablet  2. How are you currently taking this medication (dosage and times per day)? 3 tablets (30 mg total) by mouth daily with breakfast  3. Are you having a reaction (difficulty breathing--STAT)? Yes   4. What is your medication issue?   Patient states for the past few weeks he has been having shakes in his arms. He states his legs are also extremely weak. He assumes this medication is the cause. He would like to know if Dr. Elberta Fortis recommends an alternative.

## 2020-04-05 NOTE — Telephone Encounter (Signed)
Pt reporting shakiness that will not stop. Forwarding to heart failure clinic to address. Dr. Gala Romney recommended pt continuing this medication until he follows up next month with him. Pt aware their office will call to follow up/advise on this.  Pt agreeable to plan.

## 2020-04-05 NOTE — Telephone Encounter (Signed)
We did not start patient on this medication, when we saw patient in office he was taken to the ER

## 2020-04-24 NOTE — Progress Notes (Signed)
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 Found to haveVT andEF 25-30% mild AI, MR and TR. Transferred to [facility]. Cath showed 70% prox LAD with no intervention.  He was discharged with Life Vest.   cMRI 12/19/19 EF 18% diffuse LGE suggestive of sarcoid. RV 38%  Presented to HF Clinic 02/03/19 with recurrent VT. Amio started. Referred to [facility] for PET.   Re-admitted  02/18/2020 with recurrent VT and LifeVest alarms. + AKI. Started prednisone and PJP prophylaxis. ICD implanted.   PET @ [facility]. EF 25% with active inflammation c/w sarcoid.  Has not yet seen Dr. Jeralyn Ruths due to insurance reasons. Insurance only paid $156 out of $8k for his PET scan.   Amio recently decreased due to tremor.   Here for routine f/u. Still has tremor. Legs feel weak. Says tongue numb at times. No ICD shocks. Remains on prednisone.   ICD interrogated personally: No VT. Fluid ok. Activity level 3h/day   ROS: All systems negative except as listed in HPI, PMH and Problem List.  SH:  Social History   Socioeconomic History  . Marital status: Divorced    Spouse name: Not on file  . Number of children: Not on file  . Years of education: Not on file  . Highest education level: Not on file  Occupational History  . Not on file  Tobacco Use  . Smoking status: Former Smoker    Types: Cigarettes  . Smokeless tobacco: Never Used  Vaping Use  . Vaping Use: Unknown  Substance and Sexual Activity  . Alcohol use: Yes    Alcohol/week: 6.0 standard drinks    Types: 6 Cans of beer per week  . Drug use: Yes    Types: Marijuana    Comment: stopped using months ago d/t how it affected breathing  . Sexual activity: Yes  Other Topics Concern  . Not on file  Social History Narrative  . Not on  file   Social Determinants of Health   Financial Resource Strain: Not on file  Food Insecurity: Not on file  Transportation Needs: Not on file  Physical Activity: Not on file  Stress: Not on file  Social Connections: Not on file  Intimate Partner Violence: Not on file    FH:  Family History  Problem Relation Age of Onset  . Hypertension Father   . Heart disease Father   . Heart disease Mother     Past Medical History:  Diagnosis Date  . CAD (coronary artery disease)   . GERD (gastroesophageal reflux disease)   . Hyperlipidemia   . Hypertension   . Systolic heart failure (HCC) 2021   LVEF 18%, RVEF 38% on cardiac MRI 12/19/2019. possible cardiac sarcoidosis.  . Wide-complex tachycardia (HCC) 2021   wears LifeVest    Current Outpatient Medications  Medication Sig Dispense Refill  . amiodarone (PACERONE) 200 MG tablet Take 2 tablets (400 mg total) by mouth 2 (two) times daily for 7 days, THEN 1 tablet (200 mg total) 2 (two) times daily. 208 tablet 0  . aspirin EC 81 MG EC tablet Take 1 tablet (81 mg total) by mouth daily. 90 tablet 3  . atorvastatin (LIPITOR) 80 MG tablet Take 1 tablet (80 mg total) by mouth daily. 90 tablet 3  .  dapagliflozin propanediol (FARXIGA) 10 MG TABS tablet Take 1 tablet (10 mg total) by mouth daily. 30 tablet 11  . ELIQUIS 5 MG TABS tablet TAKE 1 TABLET BY MOUTH TWICE A DAY 60 tablet 1  . losartan (COZAAR) 25 MG tablet Take 1 tablet (25 mg total) by mouth daily. 90 tablet 3  . pantoprazole (PROTONIX) 40 MG tablet Take 40 mg by mouth daily before breakfast.    . predniSONE (DELTASONE) 10 MG tablet Take 3 tablets (30 mg total) by mouth daily with breakfast. 90 tablet 5  . spironolactone (ALDACTONE) 25 MG tablet Take 0.5 tablets (12.5 mg total) by mouth daily. 90 tablet 3  . sulfamethoxazole-trimethoprim (BACTRIM DS) 800-160 MG tablet Take 1 tablet by mouth 3 (three) times a week. 12 tablet 5  . VENTOLIN HFA 108 (90 Base) MCG/ACT inhaler Inhale 2  puffs into the lungs every 6 (six) hours as needed for wheezing or shortness of breath.      No current facility-administered medications for this encounter.    Vitals:   04/26/20 1100  BP: 110/68  Pulse: 66  SpO2: 94%  Weight: 80.8 kg (178 lb 3.2 oz)    PHYSICAL EXAM:  General:  Well appearing. No resp difficulty HEENT: normal Neck: supple. no JVD. Carotids 2+ bilat; no bruits. No lymphadenopathy or thryomegaly appreciated. Cor: PMI nondisplaced. Regular rate & rhythm. No rubs, gallops or murmurs. Lungs: clear Abdomen: soft, nontender, nondistended. No hepatosplenomegaly. No bruits or masses. Good bowel sounds. Extremities: no cyanosis, clubbing, rash, edema Neuro: alert & orientedx3, cranial nerves grossly intact. moves all 4 extremities w/o difficulty. + diffuse tremor  Affect pleasant   ASSESSMENT & PLAN:  1. 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  - decrease amio to 100 daily due to severe tremor. If persists will d/w Dr. to see if we can switch to mexilitene - now s/p ICD.  VT quiescent on ICD interrogation  - Kepp K > 4.0 Mg > 2.0  2. Chronic Systolic HF - Diagnosed11/2021. Presented with VT. LHC 70% LAD  - Echo EF 25-30% -cMRI 12/21 concerning for sarcoid and EF 18%. - PET 2/22 at [facility] EF 25% + active sarcoid - Stable NYHA II - Volume status ok - Continue Jardiance 10 - Continue losartan 25  - Continue spiro 12.5 - Add carvedilol 3.125 bid - Labs today   3. CAD - LHC 12/07/19 70-% LAD, no intervention -No s/s angina. - Continue statin and aspirin.   4. Possible AFL - I suspect this was likely VT - can stop Eliquis  12/09/19, MD  10:37 PM

## 2020-04-26 ENCOUNTER — Encounter (HOSPITAL_COMMUNITY): Payer: Self-pay | Admitting: Internal Medicine

## 2020-04-26 ENCOUNTER — Other Ambulatory Visit: Payer: Self-pay

## 2020-04-26 ENCOUNTER — Ambulatory Visit (HOSPITAL_COMMUNITY)
Admission: RE | Admit: 2020-04-26 | Discharge: 2020-04-26 | Disposition: A | Discharge status: Home / Self Care | Payer: BLUE CROSS/BLUE SHIELD | Source: Ambulatory Visit | Attending: Internal Medicine | Admitting: Internal Medicine

## 2020-04-26 VITALS — BP 110/68 | HR 66 | Wt 178.2 lb

## 2020-04-26 DIAGNOSIS — I2510 Atherosclerotic heart disease of native coronary artery without angina pectoris: Secondary | ICD-10-CM | POA: Insufficient documentation

## 2020-04-26 DIAGNOSIS — R251 Tremor, unspecified: Secondary | ICD-10-CM | POA: Diagnosis not present

## 2020-04-26 DIAGNOSIS — Z87891 Personal history of nicotine dependence: Secondary | ICD-10-CM | POA: Diagnosis not present

## 2020-04-26 DIAGNOSIS — Z7982 Long term (current) use of aspirin: Secondary | ICD-10-CM | POA: Insufficient documentation

## 2020-04-26 DIAGNOSIS — Z7901 Long term (current) use of anticoagulants: Secondary | ICD-10-CM | POA: Diagnosis not present

## 2020-04-26 DIAGNOSIS — K219 Gastro-esophageal reflux disease without esophagitis: Secondary | ICD-10-CM | POA: Insufficient documentation

## 2020-04-26 DIAGNOSIS — Z7952 Long term (current) use of systemic steroids: Secondary | ICD-10-CM | POA: Diagnosis not present

## 2020-04-26 DIAGNOSIS — I110 Hypertensive heart disease with heart failure: Secondary | ICD-10-CM | POA: Insufficient documentation

## 2020-04-26 DIAGNOSIS — I472 Ventricular tachycardia, unspecified: Secondary | ICD-10-CM

## 2020-04-26 DIAGNOSIS — R531 Weakness: Secondary | ICD-10-CM | POA: Insufficient documentation

## 2020-04-26 DIAGNOSIS — Z79899 Other long term (current) drug therapy: Secondary | ICD-10-CM | POA: Diagnosis not present

## 2020-04-26 DIAGNOSIS — I428 Other cardiomyopathies: Secondary | ICD-10-CM | POA: Diagnosis not present

## 2020-04-26 DIAGNOSIS — Z7984 Long term (current) use of oral hypoglycemic drugs: Secondary | ICD-10-CM | POA: Insufficient documentation

## 2020-04-26 DIAGNOSIS — Z8249 Family history of ischemic heart disease and other diseases of the circulatory system: Secondary | ICD-10-CM | POA: Diagnosis not present

## 2020-04-26 DIAGNOSIS — I5022 Chronic systolic (congestive) heart failure: Secondary | ICD-10-CM | POA: Diagnosis not present

## 2020-04-26 DIAGNOSIS — D8685 Sarcoid myocarditis: Secondary | ICD-10-CM

## 2020-04-26 HISTORY — DX: Heart failure, unspecified: I50.9

## 2020-04-26 LAB — COMPREHENSIVE METABOLIC PANEL
ALT: 50 U/L — ABNORMAL HIGH (ref 0–44)
AST: 32 U/L (ref 15–41)
Albumin: 3.7 g/dL (ref 3.5–5.0)
Alkaline Phosphatase: 50 U/L (ref 38–126)
Anion gap: 6 (ref 5–15)
BUN: 22 mg/dL — ABNORMAL HIGH (ref 6–20)
CO2: 35 mmol/L — ABNORMAL HIGH (ref 22–32)
Calcium: 9.5 mg/dL (ref 8.9–10.3)
Chloride: 101 mmol/L (ref 98–111)
Creatinine, Ser: 1.21 mg/dL (ref 0.61–1.24)
GFR, Estimated: >60 mL/min (ref >60)
Glucose, Bld: 80 mg/dL (ref 70–99)
Potassium: 4.7 mmol/L (ref 3.5–5.1)
Sodium: 142 mmol/L (ref 135–145)
Total Bilirubin: 1.4 mg/dL — ABNORMAL HIGH (ref 0.3–1.2)
Total Protein: 6 g/dL — ABNORMAL LOW (ref 6.5–8.1)

## 2020-04-26 LAB — CBC
HCT: 48.4 % (ref 39.0–52.0)
Hemoglobin: 15.7 g/dL (ref 13.0–17.0)
MCH: 32.7 pg (ref 26.0–34.0)
MCHC: 32.4 g/dL (ref 30.0–36.0)
MCV: 100.8 fL — ABNORMAL HIGH (ref 80.0–100.0)
Platelets: 182 10*3/uL (ref 150–400)
RBC: 4.8 MIL/uL (ref 4.22–5.81)
RDW: 12.8 % (ref 11.5–15.5)
WBC: 12.3 10*3/uL — ABNORMAL HIGH (ref 4.0–10.5)
nRBC: 0 % (ref 0.0–0.2)

## 2020-04-26 LAB — T4, FREE: Free T4: 1.3 ng/dL — ABNORMAL HIGH (ref 0.61–1.12)

## 2020-04-26 LAB — BRAIN NATRIURETIC PEPTIDE: B Natriuretic Peptide: 303.3 pg/mL — ABNORMAL HIGH (ref 0.0–100.0)

## 2020-04-26 LAB — TSH: TSH: 2.177 u[IU]/mL (ref 0.350–4.500)

## 2020-04-26 MED ORDER — CARVEDILOL 3.125 MG PO TABS
3.1250 mg | ORAL_TABLET | Freq: Two times a day (BID) | ORAL | 6 refills | Status: DC
Start: 2020-04-26 — End: 2020-10-25

## 2020-04-26 MED ORDER — AMIODARONE HCL 200 MG PO TABS: 100.0000 mg | ORAL_TABLET | Freq: Every day | ORAL | Status: DC

## 2020-04-26 NOTE — Addendum Note (Signed)
Encounter addended by: Noralee Space, RN on: 04/26/2020 11:44 AM  Actions taken: Medication long-term status modified, Pharmacy for encounter modified, Order list changed, Diagnosis association updated, Clinical Note Signed, Charge Capture section accepted

## 2020-04-26 NOTE — Patient Instructions (Signed)
Stop Eliquis  Decrease Amiodarone 100 mg (1/2 tab) Daily  Start Carvedilol 3.125 mg Twice daily   Labs done today, your results will be available in MyChart, we will contact you for abnormal readings.  Your physician recommends that you schedule a follow-up appointment in: 4 months with echocardiogram  If you have any questions or concerns before your next appointment please send [place] a message through [place] or call our office at [phone].    TO LEAVE A MESSAGE FOR THE NURSE SELECT OPTION 2, PLEASE LEAVE A MESSAGE INCLUDING: . YOUR NAME . DATE OF BIRTH . CALL BACK NUMBER . REASON FOR CALL**this is important as we prioritize the call backs  YOU WILL RECEIVE A CALL BACK THE SAME DAY AS LONG AS YOU CALL BEFORE 4:00 PM  At the [facility], you and your health needs are our priority. As part of our continuing mission to provide you with exceptional heart care, we have created designated Provider Care Teams. These Care Teams include your primary Cardiologist (physician) and Advanced Practice Providers (APPs- Physician Assistants and Nurse Practitioners) who all work together to provide you with the care you need, when you need it.   You may see any of the following providers on your designated Care Team at your next follow up: Marland Kitchen Dr Arvilla Meres . Dr Marca Ancona . Dr Thornell Mule . Tonye Becket, NP . Robbie Lis, PA . Shanda Bumps [place],NP . Karle Plumber, PharmD   Please be sure to bring in all your medications bottles to every appointment.

## 2020-04-27 LAB — T3, FREE: T3, Free: 2.7 pg/mL (ref 2.0–4.4)

## 2020-05-27 ENCOUNTER — Ambulatory Visit (INDEPENDENT_AMBULATORY_CARE_PROVIDER_SITE_OTHER): Payer: BLUE CROSS/BLUE SHIELD

## 2020-05-27 DIAGNOSIS — I472 Ventricular tachycardia, unspecified: Secondary | ICD-10-CM

## 2020-05-27 LAB — CUP PACEART REMOTE DEVICE CHECK
Battery Remaining Longevity: 123 mo
Battery Voltage: 3.1 V
Brady Statistic AP VP Percent: 0.18 %
Brady Statistic AP VS Percent: 60.27 %
Brady Statistic AS VP Percent: 3.58 %
Brady Statistic AS VS Percent: 35.96 %
Brady Statistic RA Percent Paced: 60.34 %
Brady Statistic RV Percent Paced: 3.74 %
Date Time Interrogation Session: 20220512052606
HighPow Impedance: 80 Ohm
Implantable Lead Implant Date: 20220204
Implantable Lead Implant Date: 20220204
Implantable Lead Location: 753859
Implantable Lead Location: 753860
Implantable Lead Model: 5076
Implantable Pulse Generator Implant Date: 20220204
Lead Channel Impedance Value: 304 Ohm
Lead Channel Impedance Value: 399 Ohm
Lead Channel Impedance Value: 399 Ohm
Lead Channel Pacing Threshold Amplitude: 0.5 V
Lead Channel Pacing Threshold Amplitude: 0.75 V
Lead Channel Pacing Threshold Pulse Width: 0.4 ms
Lead Channel Pacing Threshold Pulse Width: 0.4 ms
Lead Channel Sensing Intrinsic Amplitude: 2.5 mV
Lead Channel Sensing Intrinsic Amplitude: 2.5 mV
Lead Channel Sensing Intrinsic Amplitude: 9.875 mV
Lead Channel Sensing Intrinsic Amplitude: 9.875 mV
Lead Channel Setting Pacing Amplitude: 1.75 V
Lead Channel Setting Pacing Amplitude: 2 V
Lead Channel Setting Pacing Pulse Width: 0.4 ms
Lead Channel Setting Sensing Sensitivity: 0.3 mV

## 2020-05-30 NOTE — Progress Notes (Signed)
Electrophysiology Office Note   Date:  05/31/2020   ID:  [name], DOB August 30, 1965, MRN [id number]  PCP:  [name], PA-C  Cardiologist: Laney Potash Primary Electrophysiologist:  Domonic Hiscox Jorja Loa, MD    Chief Complaint: ICD   History of Present Illness: Charles Holmes is a 55 y.o. male who is being seen today for the evaluation of ICD at the request of [name], [place]. Presenting today for electrophysiology evaluation.  He has a history significant for hypertension, GERD, VT, nonischemic cardiomyopathy, cardiac sarcoid.  He had an echo at [facility] showed an ejection fraction of 25 to 30%.  Cardiac MRI showed an ejection fraction of 18% and likely cardiac sarcoidosis.  He is now status post Medtronic dual-chamber ICD implanted 02/20/2020.  He had a PET scan at [facility] that showed an ejection fraction of 25% with active inflammation consistent with cardiac sarcoid.  He is currently on amiodarone, though the dose was decreased due to tremor.  He is also on prednisone.  Today, he denies symptoms of palpitations, chest pain, shortness of breath, orthopnea, PND, lower extremity edema, claudication, dizziness, presyncope, syncope, bleeding, or neurologic sequela. The patient is tolerating medications without difficulties.  His main symptoms are fatigue.  He states that he sleeps all the time.  Fortunately he has not had any arrhythmias.  He is mildly volume overloaded based on his OptiVol.   Past Medical History:  Diagnosis Date  . CAD (coronary artery disease)   . CHF (congestive heart failure) (HCC)   . GERD (gastroesophageal reflux disease)   . Hyperlipidemia   . Hypertension   . Systolic heart failure (HCC) 2021   LVEF 18%, RVEF 38% on cardiac MRI 12/19/2019. possible cardiac sarcoidosis.  . Wide-complex tachycardia (HCC) 2021   wears LifeVest   Past Surgical History:  Procedure Laterality Date  . ICD IMPLANT N/A 02/20/2020   Procedure: ICD IMPLANT;   Surgeon: Regan Lemming, MD;  Location: [facility] INVASIVE CV LAB;  Service: Cardiovascular;  Laterality: N/A;  . RIGHT/LEFT HEART CATH AND CORONARY ANGIOGRAPHY N/A 12/16/2019   Procedure: RIGHT/LEFT HEART CATH AND CORONARY ANGIOGRAPHY;  Surgeon: [place], Peter M, MD;  Location: [facility] INVASIVE CV LAB;  Service: Cardiovascular;  Laterality: N/A;     Current Outpatient Medications  Medication Sig Dispense Refill  . aspirin EC 81 MG EC tablet Take 1 tablet (81 mg total) by mouth daily. 90 tablet 3  . atorvastatin (LIPITOR) 80 MG tablet Take 1 tablet (80 mg total) by mouth daily. 90 tablet 3  . carvedilol (COREG) 3.125 MG tablet Take 1 tablet (3.125 mg total) by mouth 2 (two) times daily. 60 tablet 6  . dorzolamide-timolol (COSOPT) 22.3-6.8 MG/ML ophthalmic solution Place 1 drop into both eyes in the morning and at bedtime.    . furosemide (LASIX) 20 MG tablet Take 1 tablet (20 mg total) by mouth daily. For 4 days 4 tablet 0  . latanoprost (XALATAN) 0.005 % ophthalmic solution Place 1 drop into both eyes in the morning and at bedtime.    Marland Kitchen losartan (COZAAR) 25 MG tablet Take 1 tablet (25 mg total) by mouth daily. 90 tablet 3  . mexiletine (MEXITIL) 250 MG capsule Take 1 capsule (250 mg total) by mouth 2 (two) times daily. 60 capsule 6  . pantoprazole (PROTONIX) 40 MG tablet Take 40 mg by mouth daily before breakfast.    . predniSONE (DELTASONE) 10 MG tablet Take 3 tablets (30 mg total) by mouth daily with breakfast. 90  tablet 5  . spironolactone (ALDACTONE) 25 MG tablet Take 0.5 tablets (12.5 mg total) by mouth daily. 90 tablet 3  . sulfamethoxazole-trimethoprim (BACTRIM DS) 800-160 MG tablet Take 1 tablet by mouth 3 (three) times a week. 12 tablet 5  . VENTOLIN HFA 108 (90 Base) MCG/ACT inhaler Inhale 2 puffs into the lungs every 6 (six) hours as needed for wheezing or shortness of breath.      No current facility-administered medications for this visit.    Allergies:   Percocet  [oxycodone-acetaminophen]   Social History:  The patient  reports that he has quit smoking. His smoking use included cigarettes. He has never used smokeless tobacco. He reports current alcohol use of about 6.0 standard drinks of alcohol per week. He reports current drug use. Drug: Marijuana.   Family History:  The patient's family history includes Heart disease in his father and mother; Hypertension in his father.    ROS:  Please see the history of present illness.   Otherwise, review of systems is positive for none.   All other systems are reviewed and negative.    PHYSICAL EXAM: VS:  BP 108/62   Pulse 60   Ht 5\' 8"  (1.727 m)   Wt 179 lb (81.2 kg)   SpO2 91%   BMI 27.22 kg/m  , BMI Body mass index is 27.22 kg/m. GEN: Well nourished, well developed, in no acute distress  HEENT: normal  Neck: no JVD, carotid bruits, or masses Cardiac: RRR; no murmurs, rubs, or gallops,no edema  Respiratory:  clear to auscultation bilaterally, normal work of breathing GI: soft, nontender, nondistended, + BS MS: no deformity or atrophy  Skin: warm and dry, device pocket is well healed Neuro:  Strength and sensation are intact Psych: euthymic mood, full affect  EKG:  EKG is ordered today. Personal review of the ekg ordered shows atrial paced, right bundle branch block, left anterior fascicular block, rate 60  Device interrogation is reviewed today in detail.  See PaceArt for details.   Recent Labs: 02/20/2020: Magnesium 1.9 04/26/2020: ALT 50; B Natriuretic Peptide 303.3; BUN 22; Creatinine, Ser 1.21; Hemoglobin 15.7; Platelets 182; Potassium 4.7; Sodium 142; TSH 2.177    Lipid Panel     Component Value Date/Time   CHOL 237 (H) 12/18/2019 0147   TRIG 119 12/18/2019 0147   HDL 35 (L) 12/18/2019 0147   CHOLHDL 6.8 12/18/2019 0147   VLDL 24 12/18/2019 0147   LDLCALC 178 (H) 12/18/2019 0147     Wt Readings from Last 3 Encounters:  05/31/20 179 lb (81.2 kg)  04/26/20 178 lb 3.2 oz (80.8 kg)   02/21/20 175 lb 7.8 oz (79.6 kg)      Other studies Reviewed: Additional studies/ records that were reviewed today include: Right, left heart catheterization 11/15/2019 Review of the above records today demonstrates:  1. Single vessel borderline obstructive disease involving the proximal LAD.  2. Moderately elevated LV filling pressures 3. Moderate pulmonary HTN 4. Preserved cardiac output with index 2.41.   Cardiac MRI 12/20/2019 1. There is subendocardial late gadolinium enhancement suggestive of prior infarct, however does not occur in a coronary distribution, as there is circumferential subendocardial LGE at the base along with the mid inferoseptum/anteroseptum/anterior walls and apical septum. In addition there is nonischemic scar pattern with subepicardial LGE in the mid anterior and mid inferior walls. There is also LGE of the papillary muscles. 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 In addition, there are elevated T2 values in  the basal to apical septum suggesting myocardial edema. Recommend cardiac PET scan for further evaluation of sarcoidosis. Would also recommend chest CT to evaluate for evidence of pulmonary sarcoid.  2. Moderate LV dilation with severe systolic dysfunction (EF 18%). Diffuse hypokinesis with thinning/akinesis of basal inferoseptum, basal to mid anterior, mid anteroseptum, mid inferolateral, and apical septum  3.  Normal RV size with moderate systolic dysfunction (EF 38%)   ASSESSMENT AND PLAN:  1.  Ventricular tachycardia: Found in the setting of cardiac sarcoid.  He is currently on prednisone and Bactrim for PJP prophylaxis.  Currently on amiodarone.  High risk medication monitoring.  Dose has been reduced due to tremor.  Tremor is unfortunately continued.  Due to that, we Chamille Werntz switch to mexiletine.  Fortunately he has not had any VT on ICD interrogation.    2.  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  Cardiac MRI was consistent with sarcoid and ejection fraction of 18%.  Currently on optimal medical therapy per heart failure cardiology.  He has some volume overload on his device.  We Keiran Gaffey give 20 mg of Lasix daily for the next 4 days.  3.  Coronary artery disease: 70% LAD lesion.  No intervention.  Continue aspirin and statin.  Current medicines are reviewed at length with the patient today.   The patient does not have concerns regarding his medicines.  The following changes were made today: Stop amiodarone, start mexiletine 250 mg twice daily, start Lasix 20 mg for the next 4 days  Labs/ tests ordered today include:  Orders Placed This Encounter  Procedures  . EKG 12-Lead     Disposition:   FU with Taavi Hoose 6 months  Signed, Kerissa Coia Jorja Loa, MD  05/31/2020 10:20 AM     [facility] [address] [place] [address] [phone] (office) [phone] (fax)

## 2020-05-31 ENCOUNTER — Encounter: Payer: Self-pay | Admitting: Cardiology

## 2020-05-31 ENCOUNTER — Ambulatory Visit (INDEPENDENT_AMBULATORY_CARE_PROVIDER_SITE_OTHER): Payer: BLUE CROSS/BLUE SHIELD | Admitting: Cardiology

## 2020-05-31 ENCOUNTER — Other Ambulatory Visit: Payer: Self-pay

## 2020-05-31 VITALS — BP 108/62 | HR 60 | Ht 68.0 in | Wt 179.0 lb

## 2020-05-31 DIAGNOSIS — I472 Ventricular tachycardia: Secondary | ICD-10-CM | POA: Diagnosis not present

## 2020-05-31 DIAGNOSIS — Z95810 Presence of automatic (implantable) cardiac defibrillator: Secondary | ICD-10-CM | POA: Diagnosis not present

## 2020-05-31 DIAGNOSIS — D8685 Sarcoid myocarditis: Secondary | ICD-10-CM | POA: Diagnosis not present

## 2020-05-31 MED ORDER — FUROSEMIDE 20 MG PO TABS: 20.0000 mg | ORAL_TABLET | Freq: Every day | ORAL | 0 refills | Status: DC

## 2020-05-31 MED ORDER — MEXILETINE HCL 250 MG PO CAPS: 250.0000 mg | ORAL_CAPSULE | Freq: Two times a day (BID) | ORAL | 6 refills | Status: DC

## 2020-05-31 NOTE — Patient Instructions (Signed)
Medication Instructions:  Your physician has recommended you make the following change in your medication:  1. STOP Amiodarone 2. START Mexiletine 250 mg twice daily 3. TAKE Lasix 20 mg once daily for 4 days   *If you need a refill on your cardiac medications before your next appointment, please call your pharmacy*   Lab Work: None ordered   Testing/Procedures: None ordered   Follow-Up: At [facility], you and your health needs are our priority.  As part of our continuing mission to provide you with exceptional heart care, we have created designated Provider Care Teams.  These Care Teams include your primary Cardiologist (physician) and Advanced Practice Providers (APPs -  Physician Assistants and Nurse Practitioners) who all work together to provide you with the care you need, when you need it.  Remote monitoring is used to monitor your Pacemaker or ICD from home. This monitoring reduces the number of office visits required to check your device to one time per year. It allows [place] to keep an eye on the functioning of your device to ensure it is working properly. You are scheduled for a device check from home on 08/26/2020. You may send your transmission at any time that day. If you have a wireless device, the transmission will be sent automatically. After your physician reviews your transmission, you will receive a postcard with your next transmission date.  Your next appointment:   6 month(s)  The format for your next appointment:   In Person  Provider:   Loman Brooklyn, MD   Thank you for choosing [facility]!!   Dory Horn, RN [phone]    Other Instructions   Mexiletine capsules What is this medicine? MEXILETINE (mex IL e teen) is an antiarrhythmic agent. This medicine is used to treat irregular heart rhythm and can slow rapid heartbeats. It can help your heart to return to and maintain a normal rhythm. Because of the side effects caused by this medicine, it  is usually used for heartbeat problems that may be life-threatening. This medicine may be used for other purposes; ask your health care provider or pharmacist if you have questions. COMMON BRAND NAME(S): Mexitil What should I tell my health care provider before I take this medicine? They need to know if you have any of these conditions:  liver disease  other heart problems  previous heart attack  an unusual or allergic reaction to mexiletine, other medicines, foods, dyes, or preservatives  pregnant or trying to get pregnant  breast-feeding How should I use this medicine? Take this medicine by mouth with a glass of water. Follow the directions on the prescription label. It is recommended that you take this medicine with food or an antacid. Take your doses at regular intervals. Do not take your medicine more often than directed. Do not stop taking except on the advice of your doctor or health care professional. Talk to your pediatrician regarding the use of this medicine in children. Special care may be needed. Overdosage: If you think you have taken too much of this medicine contact a poison control center or emergency room at once. NOTE: This medicine is only for you. Do not share this medicine with others. What if I miss a dose? If you miss a dose, take it as soon as you can. If it is almost time for your next dose, take only that dose. Do not take double or extra doses. What may interact with this medicine? Do not take this medicine with any of  the following medications:  dofetilide This medicine may also interact with the following medications:  caffeine  cimetidine  medicines for depression, anxiety, or psychotic disturbances  medicines to control heart rhythm  phenobarbital  phenytoin  rifampin  theophylline This list may not describe all possible interactions. Give your health care provider a list of all the medicines, herbs, non-prescription drugs, or dietary  supplements you use. Also tell them if you smoke, drink alcohol, or use illegal drugs. Some items may interact with your medicine. What should I watch for while using this medicine? Your condition will be monitored closely when you first begin therapy. Often, this drug is first started in a hospital or other monitored health care setting. Once you are on maintenance therapy, visit your doctor or health care provider for regular checks on your progress. Because your condition and use of this medicine carry some risk, it is a good idea to carry an identification card, necklace or bracelet with details of your condition, medications, and doctor or health care provider. You may get drowsy or dizzy. Do not drive, use machinery, or do anything that needs mental alertness until you know how this medicine affects you. Do not stand or sit up quickly, especially if you are an older patient. This reduces the risk of dizzy or fainting spells. Alcohol can make you more dizzy, increase flushing and rapid heartbeats. Avoid alcoholic drinks. This medicine may cause serious skin reactions. They can happen weeks to months after starting the medicine. Contact your health care provider right away if you notice fevers or flu-like symptoms with a rash. The rash may be red or purple and then turn into blisters or peeling of the skin. Or, you might notice a red rash with swelling of the face, lips or lymph nodes in your neck or under your arms. What side effects may I notice from receiving this medicine? Side effects that you should report to your doctor or health care professional as soon as possible:  allergic reactions like skin rash, itching or hives, swelling of the face, lips, or tongue  breathing problems  chest pain, continued irregular heartbeats  rash, fever, and swollen lymph nodes  redness, blistering, peeling or loosening of the skin, including inside the mouth  seizures  skin rash  trembling,  shaking  unusual bleeding or bruising  unusually weak or tired Side effects that usually do not require medical attention (report to your doctor or health care professional if they continue or are bothersome):  blurred vision  difficulty walking  heartburn  nausea, vomiting  nervousness  numbness, or tingling in the fingers or toes This list may not describe all possible side effects. Call your doctor for medical advice about side effects. You may report side effects to FDA at 1-800-FDA-1088. Where should I keep my medicine? Keep out of reach of children. Store at room temperature between 15 and 30 degrees C (59 and 86 degrees F). Throw away any unused medicine after the expiration date. NOTE: This sheet is a summary. It may not cover all possible information. If you have questions about this medicine, talk to your doctor, pharmacist, or health care provider.  2021 Elsevier/Gold Standard (2018-04-10 09:25:42)

## 2020-06-15 ENCOUNTER — Telehealth (HOSPITAL_COMMUNITY): Payer: Self-pay | Admitting: *Deleted

## 2020-06-15 NOTE — Telephone Encounter (Signed)
Pt left vm stating he thinks his medications have caused him to shake and now hes trembling all the time. I called pt back to get more information no answer/left vm for return call.

## 2020-06-18 NOTE — Progress Notes (Signed)
Remote ICD transmission.   

## 2020-08-08 ENCOUNTER — Other Ambulatory Visit: Payer: Self-pay | Admitting: Student

## 2020-08-14 ENCOUNTER — Other Ambulatory Visit: Payer: Self-pay | Admitting: Student

## 2020-08-26 ENCOUNTER — Other Ambulatory Visit: Payer: Self-pay

## 2020-08-26 ENCOUNTER — Ambulatory Visit (HOSPITAL_COMMUNITY)
Admission: RE | Admit: 2020-08-26 | Discharge: 2020-08-26 | Disposition: A | Discharge status: Home / Self Care | Payer: BLUE CROSS/BLUE SHIELD | Source: Ambulatory Visit | Attending: Internal Medicine | Admitting: Internal Medicine

## 2020-08-26 ENCOUNTER — Other Ambulatory Visit (HOSPITAL_COMMUNITY): Payer: Self-pay | Admitting: Internal Medicine

## 2020-08-26 ENCOUNTER — Ambulatory Visit (HOSPITAL_BASED_OUTPATIENT_CLINIC_OR_DEPARTMENT_OTHER)
Admission: RE | Admit: 2020-08-26 | Discharge: 2020-08-26 | Disposition: A | Discharge status: Home / Self Care | Payer: BLUE CROSS/BLUE SHIELD | Source: Ambulatory Visit | Attending: Internal Medicine | Admitting: Internal Medicine

## 2020-08-26 ENCOUNTER — Encounter (HOSPITAL_COMMUNITY): Payer: Self-pay | Admitting: Internal Medicine

## 2020-08-26 ENCOUNTER — Ambulatory Visit (INDEPENDENT_AMBULATORY_CARE_PROVIDER_SITE_OTHER): Payer: BLUE CROSS/BLUE SHIELD

## 2020-08-26 VITALS — BP 102/70 | HR 64 | Wt 177.0 lb

## 2020-08-26 DIAGNOSIS — Z95810 Presence of automatic (implantable) cardiac defibrillator: Secondary | ICD-10-CM | POA: Diagnosis not present

## 2020-08-26 DIAGNOSIS — Z7952 Long term (current) use of systemic steroids: Secondary | ICD-10-CM | POA: Diagnosis not present

## 2020-08-26 DIAGNOSIS — D8685 Sarcoid myocarditis: Secondary | ICD-10-CM

## 2020-08-26 DIAGNOSIS — Z79899 Other long term (current) drug therapy: Secondary | ICD-10-CM | POA: Diagnosis not present

## 2020-08-26 DIAGNOSIS — I700 Atherosclerosis of aorta: Secondary | ICD-10-CM | POA: Diagnosis not present

## 2020-08-26 DIAGNOSIS — I5022 Chronic systolic (congestive) heart failure: Secondary | ICD-10-CM | POA: Diagnosis present

## 2020-08-26 DIAGNOSIS — I4510 Unspecified right bundle-branch block: Secondary | ICD-10-CM | POA: Insufficient documentation

## 2020-08-26 DIAGNOSIS — I428 Other cardiomyopathies: Secondary | ICD-10-CM | POA: Insufficient documentation

## 2020-08-26 DIAGNOSIS — I071 Rheumatic tricuspid insufficiency: Secondary | ICD-10-CM | POA: Diagnosis not present

## 2020-08-26 DIAGNOSIS — Z8249 Family history of ischemic heart disease and other diseases of the circulatory system: Secondary | ICD-10-CM | POA: Diagnosis not present

## 2020-08-26 DIAGNOSIS — I472 Ventricular tachycardia, unspecified: Secondary | ICD-10-CM

## 2020-08-26 DIAGNOSIS — Z7982 Long term (current) use of aspirin: Secondary | ICD-10-CM | POA: Insufficient documentation

## 2020-08-26 DIAGNOSIS — E785 Hyperlipidemia, unspecified: Secondary | ICD-10-CM | POA: Insufficient documentation

## 2020-08-26 DIAGNOSIS — I2510 Atherosclerotic heart disease of native coronary artery without angina pectoris: Secondary | ICD-10-CM | POA: Diagnosis not present

## 2020-08-26 DIAGNOSIS — I110 Hypertensive heart disease with heart failure: Secondary | ICD-10-CM | POA: Diagnosis not present

## 2020-08-26 LAB — CUP PACEART REMOTE DEVICE CHECK
Battery Remaining Longevity: 122 mo
Battery Voltage: 3.05 V
Brady Statistic AP VP Percent: 0.1 %
Brady Statistic AP VS Percent: 56.35 %
Brady Statistic AS VP Percent: 2.17 %
Brady Statistic AS VS Percent: 41.39 %
Brady Statistic RA Percent Paced: 56.31 %
Brady Statistic RV Percent Paced: 2.25 %
Date Time Interrogation Session: 20220811044226
HighPow Impedance: 77 Ohm
Implantable Lead Implant Date: 20220204
Implantable Lead Implant Date: 20220204
Implantable Lead Location: 753859
Implantable Lead Location: 753860
Implantable Lead Model: 5076
Implantable Pulse Generator Implant Date: 20220204
Lead Channel Impedance Value: 342 Ohm
Lead Channel Impedance Value: 399 Ohm
Lead Channel Impedance Value: 418 Ohm
Lead Channel Pacing Threshold Amplitude: 0.5 V
Lead Channel Pacing Threshold Amplitude: 0.625 V
Lead Channel Pacing Threshold Pulse Width: 0.4 ms
Lead Channel Pacing Threshold Pulse Width: 0.4 ms
Lead Channel Sensing Intrinsic Amplitude: 2.625 mV
Lead Channel Sensing Intrinsic Amplitude: 2.625 mV
Lead Channel Sensing Intrinsic Amplitude: 8.875 mV
Lead Channel Sensing Intrinsic Amplitude: 8.875 mV
Lead Channel Setting Pacing Amplitude: 1.5 V
Lead Channel Setting Pacing Amplitude: 2 V
Lead Channel Setting Pacing Pulse Width: 0.4 ms
Lead Channel Setting Sensing Sensitivity: 0.3 mV

## 2020-08-26 LAB — ECHOCARDIOGRAM COMPLETE
AR max vel: 1.61 cm2
AV Area VTI: 1.76 cm2
AV Area mean vel: 1.64 cm2
AV Mean grad: 3 mmHg
AV Peak grad: 7.7 mmHg
Ao pk vel: 1.39 m/s
Area-P 1/2: 3.36 cm2
MV VTI: 1.58 cm2
S' Lateral: 5.3 cm

## 2020-08-26 LAB — BRAIN NATRIURETIC PEPTIDE: B Natriuretic Peptide: 361.8 pg/mL — ABNORMAL HIGH (ref 0.0–100.0)

## 2020-08-26 LAB — COMPREHENSIVE METABOLIC PANEL
ALT: 43 U/L (ref 0–44)
AST: 30 U/L (ref 15–41)
Albumin: 3.7 g/dL (ref 3.5–5.0)
Alkaline Phosphatase: 53 U/L (ref 38–126)
Anion gap: 4 — ABNORMAL LOW (ref 5–15)
BUN: 18 mg/dL (ref 6–20)
CO2: 36 mmol/L — ABNORMAL HIGH (ref 22–32)
Calcium: 9 mg/dL (ref 8.9–10.3)
Chloride: 102 mmol/L (ref 98–111)
Creatinine, Ser: 1.47 mg/dL — ABNORMAL HIGH (ref 0.61–1.24)
GFR, Estimated: 55 mL/min — ABNORMAL LOW (ref >60)
Glucose, Bld: 83 mg/dL (ref 70–99)
Potassium: 4.1 mmol/L (ref 3.5–5.1)
Sodium: 142 mmol/L (ref 135–145)
Total Bilirubin: 1 mg/dL (ref 0.3–1.2)
Total Protein: 5.9 g/dL — ABNORMAL LOW (ref 6.5–8.1)

## 2020-08-26 LAB — CBC
HCT: 48.4 % (ref 39.0–52.0)
Hemoglobin: 15.9 g/dL (ref 13.0–17.0)
MCH: 34.7 pg — ABNORMAL HIGH (ref 26.0–34.0)
MCHC: 32.9 g/dL (ref 30.0–36.0)
MCV: 105.7 fL — ABNORMAL HIGH (ref 80.0–100.0)
Platelets: 222 10*3/uL (ref 150–400)
RBC: 4.58 MIL/uL (ref 4.22–5.81)
RDW: 12.6 % (ref 11.5–15.5)
WBC: 13.8 10*3/uL — ABNORMAL HIGH (ref 4.0–10.5)
nRBC: 0 % (ref 0.0–0.2)

## 2020-08-26 MED ORDER — PREDNISONE 10 MG PO TABS: 20.0000 mg | ORAL_TABLET | Freq: Every day | ORAL | 5 refills | Status: DC

## 2020-08-26 MED ORDER — FOLIC ACID 1 MG PO TABS: 1.0000 mg | ORAL_TABLET | Freq: Every day | ORAL | 6 refills | Status: DC

## 2020-08-26 MED ORDER — METHOTREXATE SODIUM 10 MG PO TABS: 10.0000 mg | ORAL_TABLET | ORAL | 1 refills | Status: DC

## 2020-08-26 NOTE — Progress Notes (Signed)
ADVANCED HF CLINIC NOTE  VFI:EPPIRJ, Harrold Donath, [place] Primary Cardiologist: Norman Herrlich, MD  HPI:  [name] is a 55 y/o with HTN, GERD, systolic HF due to NICM, VT in setting of cardiac sarcoidosis   Admitted to [facility] 11/21 with CP. Found to have VT and EF 25-30% mild AI, MR and TR. Transferred to [facility]. Cath showed 70% prox LAD with no intervention.  He was discharged with Life Vest.   cMRI 12/19/19 EF 18% diffuse LGE suggestive of sarcoid. RV 38%  Presented to HF Clinic 02/03/19 with recurrent VT. Amio started. Referred to [facility] for PET.    Re-admitted  02/2020 with recurrent VT and LifeVest alarms. + AKI. Started prednisone and PJP prophylaxis. ICD implanted.    PET @ [facility]. EF 25% with active inflammation c/w sarcoid.  Amio switched due to mexilitene due to severe tremor  Here for routine f/u with his daughter. Feels poorly. 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 Gets tired easily. Legs weak and fatigued. Can do ADLs but legs get fatigued even walking from room to room. On prednisone 30mg . Face and neck swelling. No edema, orthopnaa or PND.   Echo 08/26/20 EF < 20% severely dilated LV RV mildly decreased. Personally reviewed   ICD interrogated personally: No VT. Volume was up and now down. Activity level ~ 2hr/day Personally reviewed    ROS: All systems negative except as listed in HPI, PMH and Problem List.  SH:  Social History   Socioeconomic History   Marital status: Divorced    Spouse name: Not on file   Number of children: Not on file   Years of education: Not on file   Highest education level: Not on file  Occupational History   Not on file  Tobacco Use   Smoking status: Former    Types: Cigarettes   Smokeless tobacco: Never  Vaping Use   Vaping Use: Unknown  Substance and Sexual Activity   Alcohol use: Yes    Alcohol/week: 6.0 standard drinks    Types: 6 Cans of beer per week   Drug use: Yes    Types: Marijuana    Comment: stopped using months ago  d/t how it affected breathing   Sexual activity: Yes  Other Topics Concern   Not on file  Social History Narrative   Not on file   Social Determinants of Health   Financial Resource Strain: Not on file  Food Insecurity: Not on file  Transportation Needs: Not on file  Physical Activity: Not on file  Stress: Not on file  Social Connections: Not on file  Intimate Partner Violence: Not on file    FH:  Family History  Problem Relation Age of Onset   Hypertension Father    Heart disease Father    Heart disease Mother     Past Medical History:  Diagnosis Date   CAD (coronary artery disease)    CHF (congestive heart failure) (HCC)    GERD (gastroesophageal reflux disease)    Hyperlipidemia    Hypertension    Systolic heart failure (HCC) 2021   LVEF 18%, RVEF 38% on cardiac MRI 12/19/2019. possible cardiac sarcoidosis.   Wide-complex tachycardia (HCC) 2021   wears LifeVest    Current Outpatient Medications  Medication Sig Dispense Refill   aspirin EC 81 MG EC tablet Take 1 tablet (81 mg total) by mouth daily. 90 tablet 3   atorvastatin (LIPITOR) 80 MG tablet Take 1 tablet (80 mg total) by mouth daily. 90 tablet 3  carvedilol (COREG) 3.125 MG tablet Take 1 tablet (3.125 mg total) by mouth 2 (two) times daily. 60 tablet 6   dorzolamide-timolol (COSOPT) 22.3-6.8 MG/ML ophthalmic solution Place 1 drop into both eyes in the morning and at bedtime.     latanoprost (XALATAN) 0.005 % ophthalmic solution Place 1 drop into both eyes in the morning and at bedtime.     losartan (COZAAR) 25 MG tablet Take 1 tablet (25 mg total) by mouth daily. 90 tablet 3   mexiletine (MEXITIL) 250 MG capsule Take 1 capsule (250 mg total) by mouth 2 (two) times daily. 60 capsule 6   pantoprazole (PROTONIX) 40 MG tablet Take 40 mg by mouth daily before breakfast.     predniSONE (DELTASONE) 10 MG tablet TAKE 3 TABLETS (30 MG TOTAL) BY MOUTH DAILY WITH BREAKFAST. 90 tablet 5   spironolactone (ALDACTONE) 25  MG tablet Take 0.5 tablets (12.5 mg total) by mouth daily. 90 tablet 3   sulfamethoxazole-trimethoprim (BACTRIM DS) 800-160 MG tablet TAKE 1 TABLET BY MOUTH THREE TIMES A WEEK 12 tablet 5   VENTOLIN HFA 108 (90 Base) MCG/ACT inhaler Inhale 2 puffs into the lungs every 6 (six) hours as needed for wheezing or shortness of breath.      No current facility-administered medications for this encounter.    Vitals:   08/26/20 1114  BP: 102/70  Pulse: 64  SpO2: 92%  Weight: 80.3 kg (177 lb)     PHYSICAL EXAM:  General:  Well appearing. No resp difficulty HEENT: normal Neck: supple. no JVD. Carotids 2+ bilat; no bruits. No lymphadenopathy or thryomegaly appreciated. Cor: PMI nondisplaced. Regular rate & rhythm. No rubs, gallops or murmurs. Lungs: clear Abdomen: soft, nontender, nondistended. No hepatosplenomegaly. No bruits or masses. Good bowel sounds. Extremities: no cyanosis, clubbing, rash, edema Neuro: alert & orientedx3, cranial nerves grossly intact. moves all 4 extremities w/o difficulty. + diffuse tremor  Affect pleasant   ASSESSMENT & PLAN:  1. VT - ln setting of sarcoid heart disease  - Now off amio due to tremor. Dr. Elberta Fortis has switched to mexilitene  - now s/p ICD.  VT quiescent on ICD interrogation  - Keep K > 4.0 Mg > 2.0 - Treatment of sarcoid as below   2. Chronic Systolic HF - Diagnosed 11/2019. Presented with VT. LHC 70% LAD  - Echo EF 25-30%  - cMRI 12/21 concerning for sarcoid and EF 18%.  - PET 2/22 at [facility] EF 25% + active sarcoid - Echo today 08/26/20 EF < 20% severely dilated LV RV mildly decreased. Personally reviewed - Worse NYHA IIIb  - Volume status ok. Using occasional lasix  - Continue Jardiance 10 - Continue losartan 25  - Continue spiro 12.5 - Carvedilol 3.125 bid - Labs today  - Long talk about need for possible advanced therapies. Plan CPX soon .  3. CAD - LHC 12/07/19 70-% LAD, no intervention - No s/s ischemia - Continue statin and  aspirin.   4. Cardiac sarcoid - PET 2/22 at [facility] EF 25% + active sarcoid - drop prednisone to 20mg  daily. Continue PJP prophylaxis - Start MTX 10mg  every Wednesday - Start folic acid 1mg  daily - Will attempt to arrange f/u PET at [facility] for him - D/w Dr. Tuesday at [facility].  - We discussed EM biopsy but patient is reluctant  - See back in 4 weeks to decrease prednisone to 10 and increase MTX to 15  Total time spent 45 minutes. Over half that time spent discussing above.  Arvilla Meres, MD  11:37 AM

## 2020-08-26 NOTE — Progress Notes (Signed)
  Echocardiogram 2D Echocardiogram has been performed.  Gerda Diss 08/26/2020, 11:02 AM

## 2020-08-26 NOTE — Patient Instructions (Signed)
Decrease Prednisone to 20 mg (2 tabs) Daily  Start Methotrexate 10 mg EVERY Wednesday  Start Folic Acid 1 mg Daily  Labs done today, your results will be available in MyChart, we will contact you for abnormal readings.  Your physician has recommended that you have a cardiopulmonary stress test (CPX). CPX testing is a non-invasive measurement of heart and lung function. It replaces a traditional treadmill stress test. This type of test provides a tremendous amount of information that relates not only to your present condition but also for future outcomes. This test combines measurements of you ventilation, respiratory gas exchange in the lungs, electrocardiogram (EKG), blood pressure and physical response before, during, and following an exercise protocol.  We will work on getting you scheduled for Cardiac PET Scan at [facility]  Your physician recommends that you schedule a follow-up appointment in: 1 month  If you have any questions or concerns before your next appointment please send [place] a message through [place] or call our office at [phone].    TO LEAVE A MESSAGE FOR THE NURSE SELECT OPTION 2, PLEASE LEAVE A MESSAGE INCLUDING: YOUR NAME DATE OF BIRTH CALL BACK NUMBER REASON FOR CALL**this is important as we prioritize the call backs  YOU WILL RECEIVE A CALL BACK THE SAME DAY AS LONG AS YOU CALL BEFORE 4:00 PM  milAt the [facility], you and your health needs are our priority. As part of our continuing mission to provide you with exceptional heart care, we have created designated Provider Care Teams. These Care Teams include your primary Cardiologist (physician) and Advanced Practice Providers (APPs- Physician Assistants and Nurse Practitioners) who all work together to provide you with the care you need, when you need it.   You may see any of the following providers on your designated Care Team at your next follow up: Dr Arvilla Meres Dr Marca Ancona Dr  Brandon Melnick, NP Robbie Lis, [place] Mikki Santee Karle Plumber, PharmD   Please be sure to bring in all your medications bottles to every appointment.

## 2020-08-27 ENCOUNTER — Other Ambulatory Visit (HOSPITAL_COMMUNITY): Payer: Self-pay | Admitting: Internal Medicine

## 2020-08-27 ENCOUNTER — Other Ambulatory Visit (HOSPITAL_COMMUNITY): Payer: Self-pay

## 2020-08-27 MED ORDER — METHOTREXATE SODIUM 10 MG PO TABS
10.0000 mg | ORAL_TABLET | ORAL | 1 refills | Status: DC
Start: 2020-08-27 — End: 2020-09-13

## 2020-08-27 NOTE — Telephone Encounter (Signed)
Refill not need med changed already

## 2020-09-03 ENCOUNTER — Other Ambulatory Visit (HOSPITAL_COMMUNITY): Payer: Self-pay | Admitting: *Deleted

## 2020-09-06 ENCOUNTER — Encounter (HOSPITAL_COMMUNITY): Payer: Self-pay

## 2020-09-07 ENCOUNTER — Other Ambulatory Visit (HOSPITAL_COMMUNITY): Payer: Self-pay | Admitting: *Deleted

## 2020-09-07 ENCOUNTER — Encounter (HOSPITAL_COMMUNITY): Payer: Self-pay

## 2020-09-13 ENCOUNTER — Encounter (HOSPITAL_COMMUNITY): Payer: Self-pay

## 2020-09-13 ENCOUNTER — Other Ambulatory Visit (HOSPITAL_COMMUNITY): Payer: Self-pay | Admitting: *Deleted

## 2020-09-13 ENCOUNTER — Other Ambulatory Visit (HOSPITAL_COMMUNITY): Payer: Self-pay | Admitting: Internal Medicine

## 2020-09-13 MED ORDER — METHOTREXATE SODIUM 10 MG PO TABS: 10.0000 mg | ORAL_TABLET | ORAL | 1 refills | Status: DC

## 2020-09-14 ENCOUNTER — Encounter (HOSPITAL_COMMUNITY): Payer: Self-pay | Admitting: *Deleted

## 2020-09-14 ENCOUNTER — Ambulatory Visit (HOSPITAL_COMMUNITY): Payer: Medicaid Other | Attending: Cardiology

## 2020-09-14 ENCOUNTER — Other Ambulatory Visit: Payer: Self-pay

## 2020-09-14 ENCOUNTER — Other Ambulatory Visit (HOSPITAL_COMMUNITY): Payer: Self-pay | Admitting: *Deleted

## 2020-09-14 DIAGNOSIS — I5022 Chronic systolic (congestive) heart failure: Secondary | ICD-10-CM

## 2020-09-14 MED ORDER — METHOTREXATE 2.5 MG PO TABS: ORAL_TABLET | ORAL | 0 refills | Status: DC

## 2020-09-14 NOTE — Addendum Note (Signed)
Addended by: Theresia Bough on: 09/14/2020 04:41 PM   Modules accepted: Orders

## 2020-09-14 NOTE — Progress Notes (Signed)
Pt's disability forms completed and signed by Dr Gala Romney, pt p/u

## 2020-09-16 MED ORDER — METHOTREXATE 2.5 MG PO TABS: ORAL_TABLET | ORAL | 1 refills | Status: DC

## 2020-09-16 NOTE — Addendum Note (Signed)
Addended by: Noralee Space on: 09/16/2020 09:56 AM   Modules accepted: Orders

## 2020-09-16 NOTE — Progress Notes (Signed)
Remote ICD transmission.   

## 2020-10-03 NOTE — Progress Notes (Addendum)
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 Found to have VT and EF 25-30% mild AI, MR and TR. Transferred to [facility]. Cath showed 70% prox LAD with no intervention.  He was discharged with Life Vest.   cMRI 12/19/19 EF 18% diffuse LGE suggestive of sarcoid. RV 38%  Presented to HF Clinic 02/03/19 with recurrent VT. Amio started. Referred to [facility] for PET.    Re-admitted  02/22 with recurrent VT and LifeVest alarms. + AKI. Started prednisone and PJP prophylaxis. ICD implanted.    PET @ [facility] 2/22 EF 25% with active inflammation c/w sarcoid.  Echo 08/26/20 EF < 20% severely dilated LV RV mildly decreased.  Amio switched due to mexilitene due to severe tremor  Here for routine f/u with his daughter.  I saw them last month and was feeling poorly NYHA IIB-IV struggling with ADLs however activity level ~2h/day on ICD. We discussed need for advanced therapies and ordered CPX. Prednisone dropped to 20 and MTX started. Last week dropped prednisone to 10 mg. Feeling some better. Still with easy fatigue. Volume status much better. No orthopnea or PND. Napping a lot.Can go to the store and do ADLs. Takes an occasional lasix tablet.    ICD interrogated personally: No VT. Volume much improved. Activity ~ 2hr/day Personally reviewed   CPX 09/14/20: FVC 1.37 (30%)      FEV1 0.65 (18%)        FEV1/FVC 48 (60%)        MVV 23 (16%)      Peak VO2: 16.1 (49% predicted peak VO2)  VE/VCO2 slope:  19  OUES: 1.82  Peak RER: 1.17  VE/MVV:  141%  O2pulse:  10   (63% predicted O2pulse)    ROS: All systems negative except as listed in HPI, PMH and Problem List.  SH:  Social History   Socioeconomic History   Marital status: Divorced    Spouse name: Not on file   Number of children: Not on file   Years of  education: Not on file   Highest education level: Not on file  Occupational History   Not on file  Tobacco Use   Smoking status: Former    Types: Cigarettes   Smokeless tobacco: Never  Vaping Use   Vaping Use: Unknown  Substance and Sexual Activity   Alcohol use: Yes    Alcohol/week: 6.0 standard drinks    Types: 6 Cans of beer per week   Drug use: Yes    Types: Marijuana    Comment: stopped using months ago d/t how it affected breathing   Sexual activity: Yes  Other Topics Concern   Not on file  Social History Narrative   Not on file   Social Determinants of Health   Financial Resource Strain: Not on file  Food Insecurity: Not on file  Transportation Needs: Not on file  Physical Activity: Not on file  Stress: Not on file  Social Connections: Not on file  Intimate Partner Violence: Not on file    FH:  Family History  Problem Relation Age of Onset   Hypertension Father    Heart disease Father    Heart disease Mother     Past Medical History:  Diagnosis Date   CAD (coronary artery disease)    CHF (  congestive heart failure) (HCC)    GERD (gastroesophageal reflux disease)    Hyperlipidemia    Hypertension    Systolic heart failure (HCC) 2021   LVEF 18%, RVEF 38% on cardiac MRI 12/19/2019. possible cardiac sarcoidosis.   Wide-complex tachycardia (HCC) 2021   wears LifeVest    Current Outpatient Medications  Medication Sig Dispense Refill   aspirin EC 81 MG EC tablet Take 1 tablet (81 mg total) by mouth daily. 90 tablet 3   atorvastatin (LIPITOR) 80 MG tablet Take 1 tablet (80 mg total) by mouth daily. 90 tablet 3   carvedilol (COREG) 3.125 MG tablet Take 1 tablet (3.125 mg total) by mouth 2 (two) times daily. 60 tablet 6   dorzolamide-timolol (COSOPT) 22.3-6.8 MG/ML ophthalmic solution Place 1 drop into both eyes in the morning and at bedtime.     folic acid (FOLVITE) 1 MG tablet Take 1 tablet (1 mg total) by mouth daily. 30 tablet 6   latanoprost (XALATAN)  0.005 % ophthalmic solution Place 1 drop into both eyes in the morning and at bedtime.     losartan (COZAAR) 25 MG tablet Take 1 tablet (25 mg total) by mouth daily. 90 tablet 3   methotrexate (RHEUMATREX) 2.5 MG tablet TAKE 4 TABLETS BY MOUTH EVERY WEEK ON WEDNESDAY. 16 tablet 1   mexiletine (MEXITIL) 250 MG capsule Take 1 capsule (250 mg total) by mouth 2 (two) times daily. 60 capsule 6   pantoprazole (PROTONIX) 40 MG tablet Take 40 mg by mouth daily before breakfast.     predniSONE (DELTASONE) 10 MG tablet Take 2 tablets (20 mg total) by mouth daily with breakfast. 90 tablet 5   spironolactone (ALDACTONE) 25 MG tablet Take 0.5 tablets (12.5 mg total) by mouth daily. 90 tablet 3   sulfamethoxazole-trimethoprim (BACTRIM DS) 800-160 MG tablet TAKE 1 TABLET BY MOUTH THREE TIMES A WEEK 12 tablet 5   VENTOLIN HFA 108 (90 Base) MCG/ACT inhaler Inhale 2 puffs into the lungs every 6 (six) hours as needed for wheezing or shortness of breath.      No current facility-administered medications for this encounter.    There were no vitals filed for this visit.    PHYSICAL EXAM:  General:  Sitting on exam table  No resp difficulty HEENT: normal Neck: supple. no JVD. Carotids 2+ bilat; no bruits. No lymphadenopathy or thryomegaly appreciated. Cor: PMI nondisplaced. Regular rate & rhythm. No rubs, gallops or murmurs. Lungs: clear Abdomen: soft, nontender, nondistended. No hepatosplenomegaly. No bruits or masses. Good bowel sounds. Extremities: no cyanosis, clubbing, rash, edema Neuro: alert & orientedx3, cranial nerves grossly intact. moves all 4 extremities w/o difficulty. Affect pleasant   ASSESSMENT & PLAN:  1. VT - ln setting of sarcoid heart disease  - Now off amio due to tremor. Dr. Elberta Fortis has switched to mexilitene  - now s/p ICD.  VT quiescent on ICD interrogation - Keep K > 4.0 Mg > 2.0 - Treatment of sarcoid as below   2. Chronic Systolic HF - Diagnosed 11/2019. Presented with VT.  LHC 70% LAD  - Echo EF 25-30%  - cMRI 12/21 concerning for sarcoid and EF 18%.  - PET 2/22 at [facility] EF 25% + active sarcoid - Echo 08/26/20 EF < 20% severely dilated LV RV mildly decreased.  - CPX 8/22 Peak VO2: 16.1 (49% predicted peak VO2)  VE/VCO2 slope:  19  VE/MVV 141% (primarily lung limitation) - Improved NYHA III - Volume status ok. Continue prn lasix - Continue Jardiance 10 -  Continue losartan 25  - Continue spiro 12.5 - Carvedilol 3.125 bid - BP too low too titrate GDMT.  - Labs today - Refer CR - Not candidate for advanced therapies based on PFTs (see plan with Pulmonary below)  3. CAD - LHC 12/07/19 70-% LAD, no intervention - No s/s ischemia - Continue statin and aspirin.   4. Cardiac sarcoid - PET 2/22 at [facility] EF 25% + active sarcoid - Will attempt to arrange f/u PET at [facility] for him once we get him off prednisone in 2 months  - We previously discussed EM biopsy but he is reluctant  - Wean prednisone and increase MTX to 12.5  5. COPD - Has severe COPD on PFTs with CPX (confounded by rib pain) - Refer to Pulmonary fir full PFTs and to re-evaluate  Total time spent 35 minutes. Over half that time spent discussing above.    Arvilla Meres, MD  3:21 PM

## 2020-10-04 ENCOUNTER — Ambulatory Visit (HOSPITAL_COMMUNITY)
Admission: RE | Admit: 2020-10-04 | Discharge: 2020-10-04 | Disposition: A | Discharge status: Home / Self Care | Payer: Medicaid Other | Source: Ambulatory Visit | Attending: Internal Medicine | Admitting: Internal Medicine

## 2020-10-04 ENCOUNTER — Encounter (HOSPITAL_COMMUNITY): Payer: Self-pay | Admitting: Internal Medicine

## 2020-10-04 ENCOUNTER — Other Ambulatory Visit: Payer: Self-pay

## 2020-10-04 VITALS — BP 98/62 | HR 66 | Wt 177.6 lb

## 2020-10-04 DIAGNOSIS — Z792 Long term (current) use of antibiotics: Secondary | ICD-10-CM | POA: Insufficient documentation

## 2020-10-04 DIAGNOSIS — D8685 Sarcoid myocarditis: Secondary | ICD-10-CM

## 2020-10-04 DIAGNOSIS — K219 Gastro-esophageal reflux disease without esophagitis: Secondary | ICD-10-CM | POA: Insufficient documentation

## 2020-10-04 DIAGNOSIS — Z95810 Presence of automatic (implantable) cardiac defibrillator: Secondary | ICD-10-CM | POA: Insufficient documentation

## 2020-10-04 DIAGNOSIS — I472 Ventricular tachycardia, unspecified: Secondary | ICD-10-CM

## 2020-10-04 DIAGNOSIS — I2510 Atherosclerotic heart disease of native coronary artery without angina pectoris: Secondary | ICD-10-CM | POA: Insufficient documentation

## 2020-10-04 DIAGNOSIS — I5022 Chronic systolic (congestive) heart failure: Secondary | ICD-10-CM

## 2020-10-04 DIAGNOSIS — Z7952 Long term (current) use of systemic steroids: Secondary | ICD-10-CM | POA: Insufficient documentation

## 2020-10-04 DIAGNOSIS — I110 Hypertensive heart disease with heart failure: Secondary | ICD-10-CM | POA: Insufficient documentation

## 2020-10-04 DIAGNOSIS — Z8249 Family history of ischemic heart disease and other diseases of the circulatory system: Secondary | ICD-10-CM | POA: Insufficient documentation

## 2020-10-04 DIAGNOSIS — Z4502 Encounter for adjustment and management of automatic implantable cardiac defibrillator: Secondary | ICD-10-CM | POA: Diagnosis not present

## 2020-10-04 DIAGNOSIS — Z7982 Long term (current) use of aspirin: Secondary | ICD-10-CM | POA: Insufficient documentation

## 2020-10-04 DIAGNOSIS — Z79899 Other long term (current) drug therapy: Secondary | ICD-10-CM | POA: Insufficient documentation

## 2020-10-04 DIAGNOSIS — J42 Unspecified chronic bronchitis: Secondary | ICD-10-CM

## 2020-10-04 DIAGNOSIS — J449 Chronic obstructive pulmonary disease, unspecified: Secondary | ICD-10-CM | POA: Diagnosis not present

## 2020-10-04 LAB — COMPREHENSIVE METABOLIC PANEL
ALT: 40 U/L (ref 0–44)
AST: 25 U/L (ref 15–41)
Albumin: 3.6 g/dL (ref 3.5–5.0)
Alkaline Phosphatase: 48 U/L (ref 38–126)
Anion gap: 6 (ref 5–15)
BUN: 17 mg/dL (ref 6–20)
CO2: 34 mmol/L — ABNORMAL HIGH (ref 22–32)
Calcium: 9.1 mg/dL (ref 8.9–10.3)
Chloride: 102 mmol/L (ref 98–111)
Creatinine, Ser: 1.29 mg/dL — ABNORMAL HIGH (ref 0.61–1.24)
GFR, Estimated: >60 mL/min (ref >60)
Glucose, Bld: 84 mg/dL (ref 70–99)
Potassium: 4.6 mmol/L (ref 3.5–5.1)
Sodium: 142 mmol/L (ref 135–145)
Total Bilirubin: 1 mg/dL (ref 0.3–1.2)
Total Protein: 5.6 g/dL — ABNORMAL LOW (ref 6.5–8.1)

## 2020-10-04 LAB — CBC
HCT: 43.9 % (ref 39.0–52.0)
Hemoglobin: 13.9 g/dL (ref 13.0–17.0)
MCH: 33.9 pg (ref 26.0–34.0)
MCHC: 31.7 g/dL (ref 30.0–36.0)
MCV: 107.1 fL — ABNORMAL HIGH (ref 80.0–100.0)
Platelets: 210 10*3/uL (ref 150–400)
RBC: 4.1 MIL/uL — ABNORMAL LOW (ref 4.22–5.81)
RDW: 13 % (ref 11.5–15.5)
WBC: 10.8 10*3/uL — ABNORMAL HIGH (ref 4.0–10.5)
nRBC: 0 % (ref 0.0–0.2)

## 2020-10-04 LAB — BRAIN NATRIURETIC PEPTIDE: B Natriuretic Peptide: 443.3 pg/mL — ABNORMAL HIGH (ref 0.0–100.0)

## 2020-10-04 MED ORDER — PREDNISONE 5 MG PO TABS: ORAL_TABLET | ORAL | 0 refills | Status: AC

## 2020-10-04 MED ORDER — METHOTREXATE 2.5 MG PO TABS: ORAL_TABLET | ORAL | 0 refills | Status: DC

## 2020-10-04 MED ORDER — PREDNISONE 10 MG PO TABS: 10.0000 mg | ORAL_TABLET | Freq: Every day | ORAL | 0 refills | Status: AC

## 2020-10-04 NOTE — Patient Instructions (Signed)
Prednisone Dosing Starting Today:  10 mg Daily for 2 WEEKS, then 7.5 MG (1 & 1/2 tabs) Daily FOR 2 WEEKS, *USING 5 MG TABS, then 5 MG (1 tab) Daily FOR 2 WEEKS, then 2.5 mg (1/2 tab) Daily FOR 2 WEEKS  Methotrexate Dosing Starting Today:  12.5 mg (5 tabs) Weekly FOR 2 WEEKS, then 15 mg (6 tabs) Weekly  Labs done today, your results will be available in MyChart, we will contact you for abnormal readings.  You have been referred to [facility], they will call you for an appointment  You have been referred to Cardiac Rehab at [facility], they will call you to schedule this  Your physician recommends that you schedule a follow-up appointment in: 2 months  Do the following things EVERYDAY: Weigh yourself in the morning before breakfast. Write it down and keep it in a log. Take your medicines as prescribed Eat low salt foods--Limit salt (sodium) to 2000 mg per day.  Stay as active as you can everyday Limit all fluids for the day to less than 2 liters  If you have any questions or concerns before your next appointment please send [place] a message through [place] or call our office at [phone].    TO LEAVE A MESSAGE FOR THE NURSE SELECT OPTION 2, PLEASE LEAVE A MESSAGE INCLUDING: YOUR NAME DATE OF BIRTH CALL BACK NUMBER REASON FOR CALL**this is important as we prioritize the call backs  YOU WILL RECEIVE A CALL BACK THE SAME DAY AS LONG AS YOU CALL BEFORE 4:00 PM  At the [facility], you and your health needs are our priority. As part of our continuing mission to provide you with exceptional heart care, we have created designated Provider Care Teams. These Care Teams include your primary Cardiologist (physician) and Advanced Practice Providers (APPs- Physician Assistants and Nurse Practitioners) who all work together to provide you with the care you need, when you need it.   You may see any of the following providers on your designated Care Team at your next follow  up: Dr Arvilla Meres Dr Marca Ancona Dr Brandon Melnick, NP Robbie Lis, [place] Mikki Santee Karle Plumber, PharmD   Please be sure to bring in all your medications bottles to every appointment.

## 2020-10-12 ENCOUNTER — Other Ambulatory Visit: Payer: Self-pay

## 2020-10-12 ENCOUNTER — Encounter: Payer: Self-pay | Admitting: Pulmonary Disease

## 2020-10-12 ENCOUNTER — Ambulatory Visit (INDEPENDENT_AMBULATORY_CARE_PROVIDER_SITE_OTHER): Payer: Medicaid Other | Admitting: Pulmonary Disease

## 2020-10-12 VITALS — BP 112/58 | HR 70 | Temp 98.7°F | Ht 68.0 in | Wt 176.8 lb

## 2020-10-12 DIAGNOSIS — R0602 Shortness of breath: Secondary | ICD-10-CM

## 2020-10-12 MED ORDER — TRELEGY ELLIPTA 100-62.5-25 MCG/INH IN AEPB: 1.0000 | INHALATION_SPRAY | Freq: Every day | RESPIRATORY_TRACT | 3 refills | Status: DC

## 2020-10-12 MED ORDER — TRELEGY ELLIPTA 100-62.5-25 MCG/INH IN AEPB: 100.0000 ug | INHALATION_SPRAY | Freq: Every day | RESPIRATORY_TRACT | 0 refills | Status: DC

## 2020-10-12 NOTE — Progress Notes (Signed)
Charles Holmes    [id number]    [birth year]  Primary Care Physician:Conroy, Aldean Jewett  Referring Physician: Dolores Patty, MD [address] [place],  [place] [address]  Chief complaint:   Patient being seen for shortness of breath  HPI:  History of cardiac sarcoidosis currently on tapering doses of steroids, started on methotrexate Did have significant weight gain, muscle weakness with steroid use Has been on steroids for a prolonged period of time  Follows up with heart failure clinic  He is scheduled to start cardiopulmonary rehab in about a week  History of nonischemic cardiomyopathy, systolic heart failure, history of ventricular tachycardia in the setting of cardiac sarcoidosis  Ejection fraction of 25 to 30% Most recent echocardiogram 08/26/2020 shows ejection fraction of less than 20%  ICD in place   Is short of breath with any significant exertion Gets very winded whenever he tries to tie his shoes bending over  Reformed smoker-was smoking about a pack a day quit in 2008 Worked many years in the [employer] 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   atorvastatin (LIPITOR) 80 MG tablet Take 1 tablet (80 mg total) by mouth daily.   carvedilol (COREG) 3.125 MG tablet Take 1 tablet (3.125 mg total) by mouth 2 (two) times daily.   dorzolamide-timolol (COSOPT) 22.3-6.8 MG/ML ophthalmic solution Place 1 drop into both eyes in the morning and at bedtime.   folic acid (FOLVITE) 1 MG tablet Take 1 tablet (1 mg total) by mouth daily.   latanoprost (XALATAN) 0.005 % ophthalmic solution Place 1 drop into both eyes in the morning and at bedtime.   losartan (COZAAR) 25 MG tablet Take 1 tablet (25 mg total) by mouth daily.   methotrexate (RHEUMATREX) 2.5 MG tablet Take 5 tablets (12.5 mg total) by mouth once  a week for 14 days, THEN 6 tablets (15 mg total) once a week. On WEDNESDAYS.   mexiletine (MEXITIL) 250 MG capsule Take 1 capsule (250 mg total) by mouth 2 (two) times daily.   pantoprazole (PROTONIX) 40 MG tablet Take 40 mg by mouth daily before breakfast.   predniSONE (DELTASONE) 10 MG tablet Take 1 tablet (10 mg total) by mouth daily with breakfast for 14 days.   [START ON 10/19/2020] predniSONE (DELTASONE) 5 MG tablet Take 1.5 tablets (7.5 mg total) by mouth daily with breakfast for 14 days, THEN 1 tablet (5 mg total) daily with breakfast for 14 days, THEN 0.5 tablets (2.5 mg total) daily with breakfast for 14 days.   spironolactone (ALDACTONE) 25 MG tablet Take 0.5 tablets (12.5 mg total) by mouth daily.   sulfamethoxazole-trimethoprim (BACTRIM DS) 800-160 MG tablet TAKE 1 TABLET BY MOUTH THREE TIMES A WEEK   VENTOLIN HFA 108 (90 Base) MCG/ACT inhaler Inhale 2 puffs into the lungs every 6 (six) hours as needed for wheezing or shortness of breath.    No facility-administered encounter medications on file as of 10/12/2020.    Allergies as of 10/12/2020 - Review Complete 10/12/2020  Allergen Reaction Noted   Percocet [oxycodone-acetaminophen] Itching 12/16/2019    Past Medical History:  Diagnosis Date   CAD (coronary artery disease)    CHF (congestive heart failure) (HCC)    GERD (gastroesophageal reflux disease)    Hyperlipidemia    Hypertension    Systolic heart  failure (HCC) 2021   LVEF 18%, RVEF 38% on cardiac MRI 12/19/2019. possible cardiac sarcoidosis.   Wide-complex tachycardia (HCC) 2021   wears LifeVest    Past Surgical History:  Procedure Laterality Date   ICD IMPLANT N/A 02/20/2020   Procedure: ICD IMPLANT;  Surgeon: Regan Lemming, MD;  Location: [facility] INVASIVE CV LAB;  Service: Cardiovascular;  Laterality: N/A;   RIGHT/LEFT HEART CATH AND CORONARY ANGIOGRAPHY N/A 12/16/2019   Procedure: RIGHT/LEFT HEART CATH AND CORONARY ANGIOGRAPHY;  Surgeon: Swaziland, Peter M, MD;   Location: [facility] INVASIVE CV LAB;  Service: Cardiovascular;  Laterality: N/A;    Family History  Problem Relation Age of Onset   Hypertension Father    Heart disease Father    Heart disease Mother     Social History   Socioeconomic History   Marital status: Divorced    Spouse name: Not on file   Number of children: Not on file   Years of education: Not on file   Highest education level: Not on file  Occupational History   Not on file  Tobacco Use   Smoking status: Former    Types: Cigarettes   Smokeless tobacco: Never  Vaping Use   Vaping Use: Unknown  Substance and Sexual Activity   Alcohol use: Yes    Alcohol/week: 6.0 standard drinks    Types: 6 Cans of beer per week   Drug use: Yes    Types: Marijuana    Comment: stopped using months ago d/t how it affected breathing   Sexual activity: Yes  Other Topics Concern   Not on file  Social History Narrative   Not on file   Social Determinants of Health   Financial Resource Strain: Not on file  Food Insecurity: Not on file  Transportation Needs: Not on file  Physical Activity: Not on file  Stress: Not on file  Social Connections: Not on file  Intimate Partner Violence: Not on file    Review of Systems  Constitutional:  Positive for fatigue.  Respiratory:  Positive for shortness of breath.    Vitals:   10/12/20 1429  BP: (!) 112/58  Pulse: 70  Temp: 98.7 F (37.1 C)  SpO2: 91%     Physical Exam Constitutional:      Appearance: Normal appearance.  HENT:     Head: Normocephalic.     Mouth/Throat:     Mouth: Mucous membranes are moist.  Cardiovascular:     Rate and Rhythm: Normal rate and regular rhythm.     Heart sounds: No murmur heard.   No friction rub.  Pulmonary:     Effort: No respiratory distress.     Breath sounds: No stridor. No wheezing or rhonchi.  Musculoskeletal:     Cervical back: No rigidity or tenderness.  Neurological:     Mental Status: He is alert.  Psychiatric:        Mood  and Affect: Mood normal.     Data Reviewed: Findings on cardiopulmonary exercise noted with PFT showing severe obstructive disease  Echocardiogram with ejection fraction of 20%  High-resolution CT scan of the chest in 2021-very minimal adenopathy, no pulmonary scarring  Assessment:  History of cardiac sarcoidosis  Does not appear to have pulmonary sarcoidosis based off of the the last CT chest  Shortness of breath is likely multifactorial including deconditioning, steroid induced myopathy may be contributing, proximal muscle weakness, reduced ejection fraction  Spirometric functions during CPX did reveal severe obstructive disease -With past history of smoking  and occupational predisposition -We did discuss possible benefit with inhaler use  He currently does have albuterol which he rarely uses  A trial with Trelegy will be initiated  Chronic systolic heart failure Coronary artery disease  Plan/Recommendations: Trial with Trelegy 100  Encouraged his commitment to cardiopulmonary rehab which should help  He is currently being weaned off steroids  We will schedule him for full pulmonary function test soon   Encouraged to stay active  Encouraged to call with any significant concerns  Tentative follow-up in about 8 to 10 weeks  I spent 45 minutes dedicated to the care of this patient on the date of this encounter to include previsit review of records, face-to-face time with the patient discussing conditions above, post visit ordering of testing, clinical documentation with electronic health record  and communicated necessary findings to members of the patient's care team  Virl Diamond MD [facility] 10/12/2020, 2:57 PM  CC: Bensimhon, Bevelyn Buckles, MD

## 2020-10-12 NOTE — Patient Instructions (Signed)
There is evidence of obstruction and the breathing study you had done during your cardiopulmonary exercise  Use albuterol as needed-the inhaler you already have  We will start you on Trelegy-1 puff daily regardless of how you are feeling -Make sure you rinse your mouth after using, regularly  Exercise will be will will help you the most  We will get the breathing study after you complete your prednisone  I will see you in 10 to 12 weeks  Call with any significant concerns

## 2020-10-25 ENCOUNTER — Other Ambulatory Visit (HOSPITAL_COMMUNITY): Payer: Self-pay | Admitting: Internal Medicine

## 2020-11-23 ENCOUNTER — Other Ambulatory Visit: Payer: Self-pay | Admitting: Pulmonary Disease

## 2020-11-24 ENCOUNTER — Other Ambulatory Visit (HOSPITAL_COMMUNITY): Payer: Self-pay

## 2020-11-24 ENCOUNTER — Telehealth: Payer: Self-pay | Admitting: Pharmacy Technician

## 2020-11-24 NOTE — Telephone Encounter (Addendum)
Patient Advocate Encounter  Received notification from [employer] that prior authorization for TRELEGY 100 is required.   PA submitted on 11.9.22 Key [id number]  PA resubmitted on 11.10.22 Key [id number] Status is pending   [facility] will continue to follow  Ricke Hey, CPhT Patient Advocate Phone: [phone] Fax:  [phone]

## 2020-11-24 NOTE — Telephone Encounter (Signed)
Two step therapy required. Must try/fail Symbicort, Dulera, Advair

## 2020-11-24 NOTE — Telephone Encounter (Signed)
Noted.   AO please advise. PA has been submitted however patient must try/fail medications listed. Thanks :)

## 2020-11-25 ENCOUNTER — Other Ambulatory Visit (HOSPITAL_COMMUNITY): Payer: Self-pay

## 2020-11-25 ENCOUNTER — Telehealth: Payer: Self-pay | Admitting: Pulmonary Disease

## 2020-11-25 ENCOUNTER — Other Ambulatory Visit: Payer: Self-pay | Admitting: Pulmonary Disease

## 2020-11-25 ENCOUNTER — Ambulatory Visit (INDEPENDENT_AMBULATORY_CARE_PROVIDER_SITE_OTHER): Payer: Medicaid Other

## 2020-11-25 DIAGNOSIS — I472 Ventricular tachycardia, unspecified: Secondary | ICD-10-CM

## 2020-11-25 LAB — CUP PACEART REMOTE DEVICE CHECK
Battery Remaining Longevity: 120 mo
Battery Voltage: 3.02 V
Brady Statistic AP VP Percent: 3.43 %
Brady Statistic AP VS Percent: 28.84 %
Brady Statistic AS VP Percent: 16.23 %
Brady Statistic AS VS Percent: 51.49 %
Brady Statistic RA Percent Paced: 32.2 %
Brady Statistic RV Percent Paced: 19.66 %
Date Time Interrogation Session: 20221110061604
HighPow Impedance: 78 Ohm
Implantable Lead Implant Date: 20220204
Implantable Lead Implant Date: 20220204
Implantable Lead Location: 753859
Implantable Lead Location: 753860
Implantable Lead Model: 5076
Implantable Pulse Generator Implant Date: 20220204
Lead Channel Impedance Value: 342 Ohm
Lead Channel Impedance Value: 399 Ohm
Lead Channel Impedance Value: 418 Ohm
Lead Channel Pacing Threshold Amplitude: 0.5 V
Lead Channel Pacing Threshold Amplitude: 0.625 V
Lead Channel Pacing Threshold Pulse Width: 0.4 ms
Lead Channel Pacing Threshold Pulse Width: 0.4 ms
Lead Channel Sensing Intrinsic Amplitude: 3.875 mV
Lead Channel Sensing Intrinsic Amplitude: 3.875 mV
Lead Channel Sensing Intrinsic Amplitude: 8.25 mV
Lead Channel Sensing Intrinsic Amplitude: 8.25 mV
Lead Channel Setting Pacing Amplitude: 1.5 V
Lead Channel Setting Pacing Amplitude: 2 V
Lead Channel Setting Pacing Pulse Width: 0.4 ms
Lead Channel Setting Sensing Sensitivity: 0.3 mV

## 2020-11-25 MED ORDER — BUDESONIDE-FORMOTEROL FUMARATE 160-4.5 MCG/ACT IN AERO: 2.0000 | INHALATION_SPRAY | Freq: Two times a day (BID) | RESPIRATORY_TRACT | 6 refills | Status: DC

## 2020-11-25 NOTE — Telephone Encounter (Signed)
Prescription for Symbicort sent in.

## 2020-11-25 NOTE — Telephone Encounter (Signed)
Noted. Will await determination  

## 2020-11-25 NOTE — Telephone Encounter (Signed)
PA has been submitted with additional information. [employer] is still showing coverage from [employer], although it is termed as of 8.29.22. However, pt plan shows step therapy required for approval. Will continue to follow.

## 2020-11-25 NOTE — Telephone Encounter (Signed)
Spoke to patient, who stated that trelegy requires a PA.  Pharmacy team, please advise. Thanks

## 2020-11-25 NOTE — Progress Notes (Signed)
Prescription for Symbicort sent in.

## 2020-11-26 ENCOUNTER — Other Ambulatory Visit (HOSPITAL_COMMUNITY): Payer: Self-pay

## 2020-11-26 NOTE — Telephone Encounter (Signed)
Received a fax regarding Prior Authorization from [employer] for TRELEGY . Authorization has been DENIED because: Per your health plan's criteria, this drug is covered if you meet the following:  All of the following: (A) One of the following: (i) You have failed two preferred drugs as confirmed by claims history or submission of medical records: brand Advair Diskus, Advair HFA, Dulera and brand Symbicort. (ii) You cannot use two preferred drugs (please specify contraindication or intolerance): brand Advair Diskus, Advair HFA, Dulera and brand Symbicort. (B) The use of this drug is supported (for a Food and Drug Administration-approved indication or by an appropriate compendia of current literature): asthma and chronic obstructive pulmonary disease (COPD).

## 2020-11-30 ENCOUNTER — Telehealth: Payer: Self-pay | Admitting: Pulmonary Disease

## 2020-11-30 NOTE — Telephone Encounter (Signed)
disregard

## 2020-12-01 ENCOUNTER — Encounter (HOSPITAL_COMMUNITY): Payer: Self-pay

## 2020-12-01 ENCOUNTER — Other Ambulatory Visit (HOSPITAL_COMMUNITY): Payer: Self-pay | Admitting: *Deleted

## 2020-12-01 MED ORDER — FUROSEMIDE 20 MG PO TABS: 20.0000 mg | ORAL_TABLET | Freq: Every day | ORAL | 3 refills | Status: DC | PRN

## 2020-12-01 NOTE — Telephone Encounter (Signed)
Returned call and advised nothing else is required.

## 2020-12-02 NOTE — Telephone Encounter (Signed)
Patient states would like RX for Trelegy to be called into pharmacy. Pharmacy is [facility] [place]. Patient phone number is [phone].

## 2020-12-03 MED ORDER — TRELEGY ELLIPTA 100-62.5-25 MCG/ACT IN AEPB: 1.0000 | INHALATION_SPRAY | Freq: Every day | RESPIRATORY_TRACT | 11 refills | Status: DC

## 2020-12-03 NOTE — Telephone Encounter (Signed)
Spoke with the pt  He states that he heard from his case worker and trelegy is now approved  He wanted rx sent to pharm  I have sent this in and advised for him to call us back if still has trouble obtaining med

## 2020-12-06 NOTE — Progress Notes (Signed)
Remote ICD transmission.   

## 2020-12-07 ENCOUNTER — Other Ambulatory Visit: Payer: Self-pay | Admitting: Cardiology

## 2020-12-08 ENCOUNTER — Other Ambulatory Visit (HOSPITAL_COMMUNITY): Payer: Self-pay

## 2020-12-09 ENCOUNTER — Other Ambulatory Visit: Payer: Self-pay | Admitting: Cardiology

## 2020-12-10 NOTE — Telephone Encounter (Signed)
Called the pt and there was no answer- LMTCB    

## 2020-12-13 ENCOUNTER — Other Ambulatory Visit: Payer: Self-pay | Admitting: Pulmonary Disease

## 2020-12-13 MED ORDER — ADVAIR HFA 230-21 MCG/ACT IN AERO: 2.0000 | INHALATION_SPRAY | Freq: Two times a day (BID) | RESPIRATORY_TRACT | 12 refills | Status: AC

## 2020-12-13 NOTE — Telephone Encounter (Signed)
Lm for patient.  

## 2020-12-13 NOTE — Telephone Encounter (Signed)
Advair HFA sent into pharmacy

## 2020-12-13 NOTE — Progress Notes (Signed)
Advair HFA called into pharmacy

## 2020-12-13 NOTE — Telephone Encounter (Signed)
Spoke with the pt  He states that he is not using symbicort bc the trelegy was not approved  He hates the way the symbicort tastes and does not want to try using this again  Looks like advair and dulera are covered options  Do you want to switch him to one of these?

## 2020-12-15 NOTE — Telephone Encounter (Signed)
Called and spoke with patient to see if he was aware of inhaler that was sent in 2 days ago by Dr. Wynona Neat. He said that he was not but that he will go to the pharmacy and follow up on it. Advised him to call us back if he needed anything else. Nothing further needed at this time.

## 2020-12-16 ENCOUNTER — Ambulatory Visit (HOSPITAL_COMMUNITY)
Admission: RE | Admit: 2020-12-16 | Discharge: 2020-12-16 | Disposition: A | Discharge status: Home / Self Care | Payer: Medicaid Other | Source: Ambulatory Visit | Attending: Internal Medicine | Admitting: Internal Medicine

## 2020-12-16 VITALS — BP 110/60 | HR 60 | Wt 175.6 lb

## 2020-12-16 DIAGNOSIS — I2510 Atherosclerotic heart disease of native coronary artery without angina pectoris: Secondary | ICD-10-CM

## 2020-12-16 DIAGNOSIS — J42 Unspecified chronic bronchitis: Secondary | ICD-10-CM | POA: Diagnosis not present

## 2020-12-16 DIAGNOSIS — D8689 Sarcoidosis of other sites: Secondary | ICD-10-CM | POA: Diagnosis not present

## 2020-12-16 DIAGNOSIS — Z79899 Other long term (current) drug therapy: Secondary | ICD-10-CM | POA: Insufficient documentation

## 2020-12-16 DIAGNOSIS — I4729 Other ventricular tachycardia: Secondary | ICD-10-CM | POA: Insufficient documentation

## 2020-12-16 DIAGNOSIS — I5022 Chronic systolic (congestive) heart failure: Secondary | ICD-10-CM

## 2020-12-16 DIAGNOSIS — K219 Gastro-esophageal reflux disease without esophagitis: Secondary | ICD-10-CM | POA: Insufficient documentation

## 2020-12-16 DIAGNOSIS — Z7982 Long term (current) use of aspirin: Secondary | ICD-10-CM | POA: Insufficient documentation

## 2020-12-16 DIAGNOSIS — J449 Chronic obstructive pulmonary disease, unspecified: Secondary | ICD-10-CM | POA: Diagnosis not present

## 2020-12-16 DIAGNOSIS — D8685 Sarcoid myocarditis: Secondary | ICD-10-CM

## 2020-12-16 DIAGNOSIS — I110 Hypertensive heart disease with heart failure: Secondary | ICD-10-CM | POA: Diagnosis not present

## 2020-12-16 DIAGNOSIS — Z4502 Encounter for adjustment and management of automatic implantable cardiac defibrillator: Secondary | ICD-10-CM | POA: Insufficient documentation

## 2020-12-16 LAB — CBC
HCT: 43.6 % (ref 39.0–52.0)
Hemoglobin: 14.6 g/dL (ref 13.0–17.0)
MCH: 35 pg — ABNORMAL HIGH (ref 26.0–34.0)
MCHC: 33.5 g/dL (ref 30.0–36.0)
MCV: 104.6 fL — ABNORMAL HIGH (ref 80.0–100.0)
Platelets: 249 10*3/uL (ref 150–400)
RBC: 4.17 MIL/uL — ABNORMAL LOW (ref 4.22–5.81)
RDW: 13.2 % (ref 11.5–15.5)
WBC: 6.2 10*3/uL (ref 4.0–10.5)
nRBC: 0 % (ref 0.0–0.2)

## 2020-12-16 LAB — COMPREHENSIVE METABOLIC PANEL
ALT: 86 U/L — ABNORMAL HIGH (ref 0–44)
AST: 73 U/L — ABNORMAL HIGH (ref 15–41)
Albumin: 4 g/dL (ref 3.5–5.0)
Alkaline Phosphatase: 73 U/L (ref 38–126)
Anion gap: 8 (ref 5–15)
BUN: 15 mg/dL (ref 6–20)
CO2: 32 mmol/L (ref 22–32)
Calcium: 9.3 mg/dL (ref 8.9–10.3)
Chloride: 101 mmol/L (ref 98–111)
Creatinine, Ser: 1.39 mg/dL — ABNORMAL HIGH (ref 0.61–1.24)
GFR, Estimated: 59 mL/min — ABNORMAL LOW (ref >60)
Glucose, Bld: 93 mg/dL (ref 70–99)
Potassium: 3.9 mmol/L (ref 3.5–5.1)
Sodium: 141 mmol/L (ref 135–145)
Total Bilirubin: 0.8 mg/dL (ref 0.3–1.2)
Total Protein: 6.3 g/dL — ABNORMAL LOW (ref 6.5–8.1)

## 2020-12-16 LAB — BRAIN NATRIURETIC PEPTIDE: B Natriuretic Peptide: 154.9 pg/mL — ABNORMAL HIGH (ref 0.0–100.0)

## 2020-12-16 MED ORDER — FUROSEMIDE 40 MG PO TABS: 40.0000 mg | ORAL_TABLET | ORAL | 6 refills | Status: DC

## 2020-12-16 MED ORDER — METHOTREXATE 2.5 MG PO TABS: ORAL_TABLET | ORAL | 0 refills | Status: DC

## 2020-12-16 NOTE — Progress Notes (Addendum)
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 Found to have VT and EF 25-30% mild AI, MR and TR. Transferred to [facility]. Cath showed 70% prox LAD with no intervention.  He was discharged with Life Vest.   cMRI 12/19/19 EF 18% diffuse LGE suggestive of sarcoid. RV 38%  Presented to HF Clinic 02/03/19 with recurrent VT. Amio started. Referred to [facility] for PET.    Re-admitted  02/22 with recurrent VT and LifeVest alarms. + AKI. Started prednisone and PJP prophylaxis. ICD implanted.    PET @ [facility] 2/22 EF 25% with active inflammation c/w sarcoid.  Echo 08/26/20 EF < 20% severely dilated LV RV mildly decreased.  Amio switched due to mexilitene due to severe tremor  Here for routine f/u with his daughter.  I saw them last month and was feeling poorly NYHA IIB-IV struggling with ADLs however activity level ~2h/day on ICD. We discussed need for advanced therapies and ordered CPX. CPX with primarily lung limitation. PFTs with severe obstructive disease  Has seen Dr. Wynona Neat in Pulmonary. Started on inhalers. Initially started on Trelegy but insurance wouldn't pay for it. Then switched to symbicort but couldn't stand the taste. Now starting another inhaler. Remains SOB with mild activity especially when has fluid build up. Off prednisone x 1 month. On MTX 15 mg every wed. Going to CP rehab at [place] when he doesn't have fluid build up. Takes lasix 20mg -40mg  1-2x week prn for volume overload. No ICD firing. Took 40mg  lasix yesterday and lost 7 pounds.    ICD interrogated personally: No VT. Volume much improved. Activity ~ 2hr/day Personally reviewed   CPX 09/14/20: FVC 1.37 (30%)      FEV1 0.65 (18%)        FEV1/FVC 48 (60%)        MVV 23 (16%)      Peak VO2: 16.1 (49% predicted peak VO2)  VE/VCO2 slope:  19   OUES: 1.82  Peak RER: 1.17  VE/MVV:  141%  O2pulse:  10   (63% predicted O2pulse)    ROS: All systems negative except as listed in HPI, PMH and Problem List.  SH:  Social History   Socioeconomic History   Marital status: Divorced    Spouse name: Not on file   Number of children: Not on file   Years of education: Not on file   Highest education level: Not on file  Occupational History   Not on file  Tobacco Use   Smoking status: Former    Types: Cigarettes   Smokeless tobacco: Never  Vaping Use   Vaping Use: Unknown  Substance and Sexual Activity   Alcohol use: Yes    Alcohol/week: 6.0 standard drinks    Types: 6 Cans of beer per week   Drug use: Yes    Types: Marijuana    Comment: stopped using months ago d/t how it affected breathing   Sexual activity: Yes  Other Topics Concern   Not on file  Social History Narrative   Not on file   Social Determinants of Health   Financial Resource Strain: Not on file  Food Insecurity: Not on file  Transportation Needs: Not on file  Physical Activity: Not on file  Stress: Not on file  Social Connections: Not on file  Intimate  Partner Violence: Not on file    FH:  Family History  Problem Relation Age of Onset   Hypertension Father    Heart disease Father    Heart disease Mother     Past Medical History:  Diagnosis Date   CAD (coronary artery disease)    CHF (congestive heart failure) (HCC)    GERD (gastroesophageal reflux disease)    Hyperlipidemia    Hypertension    Systolic heart failure (HCC) 2021   LVEF 18%, RVEF 38% on cardiac MRI 12/19/2019. possible cardiac sarcoidosis.   Wide-complex tachycardia (HCC) 2021   wears LifeVest    Current Outpatient Medications  Medication Sig Dispense Refill   aspirin EC 81 MG EC tablet Take 1 tablet (81 mg total) by mouth daily. 90 tablet 3   atorvastatin (LIPITOR) 80 MG tablet TAKE 1 TABLET BY MOUTH EVERY DAY 90 tablet 2   carvedilol (COREG) 3.125 MG tablet TAKE 1  TABLET BY MOUTH 2 TIMES DAILY. 180 tablet 2   dorzolamide-timolol (COSOPT) 22.3-6.8 MG/ML ophthalmic solution Place 1 drop into both eyes in the morning and at bedtime.     fluticasone-salmeterol (ADVAIR HFA) 230-21 MCG/ACT inhaler Inhale 2 puffs into the lungs 2 (two) times daily. (Patient not taking: Reported on 12/16/2020) 1 each 12   folic acid (FOLVITE) 1 MG tablet Take 1 tablet (1 mg total) by mouth daily. 30 tablet 6   furosemide (LASIX) 20 MG tablet Take 1 tablet (20 mg total) by mouth daily as needed for fluid or edema. 30 tablet 3   latanoprost (XALATAN) 0.005 % ophthalmic solution Place 1 drop into both eyes in the morning and at bedtime.     losartan (COZAAR) 25 MG tablet Take 1 tablet (25 mg total) by mouth daily. 90 tablet 3   methotrexate (RHEUMATREX) 2.5 MG tablet Take 5 tablets (12.5 mg total) by mouth once a week for 14 days, THEN 6 tablets (15 mg total) once a week. On WEDNESDAYS. 58 tablet 0   mexiletine (MEXITIL) 250 MG capsule TAKE 1 CAPSULE BY MOUTH 2 TIMES DAILY. 180 capsule 1   pantoprazole (PROTONIX) 40 MG tablet Take 40 mg by mouth daily before breakfast.     spironolactone (ALDACTONE) 25 MG tablet Take 0.5 tablets (12.5 mg total) by mouth daily. 90 tablet 3   sulfamethoxazole-trimethoprim (BACTRIM DS) 800-160 MG tablet TAKE 1 TABLET BY MOUTH THREE TIMES A WEEK 12 tablet 5   No current facility-administered medications for this encounter.    Vitals:   12/16/20 1055  BP: 110/60  Pulse: 60  SpO2: 96%  Weight: 79.7 kg (175 lb 9.6 oz)   Wt Readings from Last 3 Encounters:  12/16/20 79.7 kg (175 lb 9.6 oz)  10/12/20 80.2 kg (176 lb 12.8 oz)  10/04/20 80.6 kg (177 lb 9.6 oz)    PHYSICAL EXAM: General:  Plethoric facies. No resp difficulty HEENT: normal Neck: supple. no JVD. Carotids 2+ bilat; no bruits. No lymphadenopathy or thryomegaly appreciated. Cor: PMI nondisplaced. Regular rate & rhythm. No rubs, gallops or murmurs. Lungs: clear with markedly decreased  BS Abdomen: obese soft, nontender, nondistended. No hepatosplenomegaly. No bruits or masses. Good bowel sounds. Extremities: no cyanosis, clubbing, rash, 1+ edema Neuro: alert & orientedx3, cranial nerves grossly intact. moves all 4 extremities w/o difficulty. Affect pleasant   ASSESSMENT & PLAN:  1. VT - ln setting of sarcoid heart disease  - Now off amio due to tremor. Dr. Elberta Fortis has switched to mexilitene  - now s/p ICD.  VT quiescent on ICD interrogation today - Keep K > 4.0 Mg > 2.0 - Treatment of sarcoid as below   2. Chronic Systolic HF - Diagnosed 11/2019. Presented with VT. LHC 70% LAD  - Echo EF 25-30%  - cMRI 12/21 concerning for sarcoid and EF 18%.  - PET 2/22 at [facility] EF 25% + active sarcoid - Echo 08/26/20 EF < 20% severely dilated LV RV mildly decreased.  - CPX 8/22 Peak VO2: 16.1 (49% predicted peak VO2)  VE/VCO2 slope:  19  VE/MVV 141% (primarily lung limitation) - Improved NYHA III - Volume status ok. Continue prn lasix - Continue Jardiance 10 - Continue losartan 25  - Continue spiro 12.5 - Carvedilol 3.125 bid - BP too low too titrate GDMT.  - Labs today - Refer CR - Not candidate for advanced therapies based on PFTs (see plan with Pulmonary below)  3. CAD - LHC 12/07/19 70-% LAD, no intervention - No s/s ischemia - Continue statin and aspirin.   4. Cardiac sarcoid - PET 2/22 at [facility] EF 25% + active sarcoid - Off prednisone x 1 month. No w on MTX 15mg  weekly. Titrate to 17.5mg  then 20 mg.  - Will attempt to arrange f/u PET at [facility] to assess activity off prednisone - We previously discussed EM biopsy but he is reluctant  - Can stop bactrim   5. COPD - Has severe COPD on PFTs with CPX (confounded by rib pain) - Continue to follow with Pulmonary  [facility], MD  11:29 AM

## 2020-12-16 NOTE — Patient Instructions (Signed)
Medication Changes:  --Stop Bactrim --Take Furosemide 40 mg every Monday and Friday, can take other days aS NEEDED --Increase Methotrexate to:  --7 tabs every Wed for 2 weeks, then  --increase to 8 tabs every Wed  Lab Work:  Labs done today, your results will be available in MyChart, we will contact you for abnormal readings.   Testing/Procedures:  Your provider has recommended you have a Cardiac PET Scan at [facility]. We will get this approved with your insurance company and get it scheduled for you. We will call you with the date and time and instructions. [facility] will call you to review this information the day before the test.  Referrals:  None  Special Instructions // Education:  Do the following things EVERYDAY: Weigh yourself in the morning before breakfast. Write it down and keep it in a log. Take your medicines as prescribed Eat low salt foods--Limit salt (sodium) to 2000 mg per day.  Stay as active as you can everyday Limit all fluids for the day to less than 2 liters   Follow-Up in: 4 months  At the [facility], you and your health needs are our priority. We have a designated team specialized in the treatment of Heart Failure. This Care Team includes your primary Heart Failure Specialized Cardiologist (physician), Advanced Practice Providers (APPs- Physician Assistants and Nurse Practitioners), and Pharmacist who all work together to provide you with the care you need, when you need it.   You may see any of the following providers on your designated Care Team at your next follow up:  Dr Arvilla Meres Dr Carron Curie, NP Robbie Lis, [place] [facility], [place] Karle Plumber, PharmD   Please be sure to bring in all your medications bottles to every appointment.   Need to Contact us:  If you have any questions or concerns before your next appointment please send [place] a message through [place] or call our office at  [phone].    TO LEAVE A MESSAGE FOR THE NURSE SELECT OPTION 2, PLEASE LEAVE A MESSAGE INCLUDING: YOUR NAME DATE OF BIRTH CALL BACK NUMBER REASON FOR CALL**this is important as we prioritize the call backs  YOU WILL RECEIVE A CALL BACK THE SAME DAY AS LONG AS YOU CALL BEFORE 4:00 PM

## 2020-12-16 NOTE — Progress Notes (Signed)
Pt's disability form completed and signed by Dr Gala Romney during visit. Copy made for chart, original given back to pt, he will complete his section and submit to the company

## 2020-12-16 NOTE — Addendum Note (Signed)
Encounter addended by: Dolores Patty, MD on: 12/16/2020 11:30 PM  Actions taken: Clinical Note Signed, Charge Capture section accepted

## 2020-12-17 ENCOUNTER — Other Ambulatory Visit: Payer: Self-pay | Admitting: Cardiology

## 2020-12-21 ENCOUNTER — Other Ambulatory Visit: Payer: Self-pay | Admitting: Pulmonary Disease

## 2020-12-23 LAB — SARS CORONAVIRUS 2 (TAT 6-24 HRS): SARS Coronavirus 2: NEGATIVE

## 2020-12-24 ENCOUNTER — Ambulatory Visit (INDEPENDENT_AMBULATORY_CARE_PROVIDER_SITE_OTHER): Payer: Medicaid Other | Admitting: Pulmonary Disease

## 2020-12-24 ENCOUNTER — Other Ambulatory Visit: Payer: Self-pay

## 2020-12-24 ENCOUNTER — Encounter: Payer: Self-pay | Admitting: Pulmonary Disease

## 2020-12-24 VITALS — BP 108/68 | HR 64 | Temp 97.3°F | Ht 67.5 in | Wt 178.0 lb

## 2020-12-24 DIAGNOSIS — R0602 Shortness of breath: Secondary | ICD-10-CM | POA: Diagnosis not present

## 2020-12-24 LAB — PULMONARY FUNCTION TEST
DL/VA % pred: 96 %
DL/VA: 4.17 ml/min/mmHg/L
DLCO cor % pred: 69 %
DLCO cor: 18.17 ml/min/mmHg
DLCO unc % pred: 68 %
DLCO unc: 17.91 ml/min/mmHg
FEF 25-75 Post: 0.7 L/sec
FEF 25-75 Pre: 0.5 L/sec
FEF2575-%Change-Post: 39 %
FEF2575-%Pred-Post: 24 %
FEF2575-%Pred-Pre: 17 %
FEV1-%Change-Post: 17 %
FEV1-%Pred-Post: 38 %
FEV1-%Pred-Pre: 32 %
FEV1-Post: 1.3 L
FEV1-Pre: 1.1 L
FEV1FVC-%Change-Post: 5 %
FEV1FVC-%Pred-Pre: 66 %
FEV6-%Change-Post: 13 %
FEV6-%Pred-Post: 57 %
FEV6-%Pred-Pre: 50 %
FEV6-Post: 2.43 L
FEV6-Pre: 2.15 L
FEV6FVC-%Change-Post: 1 %
FEV6FVC-%Pred-Post: 104 %
FEV6FVC-%Pred-Pre: 103 %
FVC-%Change-Post: 11 %
FVC-%Pred-Post: 54 %
FVC-%Pred-Pre: 48 %
FVC-Post: 2.43 L
FVC-Pre: 2.17 L
Post FEV1/FVC ratio: 53 %
Post FEV6/FVC ratio: 100 %
Pre FEV1/FVC ratio: 51 %
Pre FEV6/FVC Ratio: 99 %
RV % pred: 151 %
RV: 3.1 L
TLC % pred: 89 %
TLC: 5.79 L

## 2020-12-24 NOTE — Patient Instructions (Signed)
Full PFT performed today. °

## 2020-12-24 NOTE — Progress Notes (Signed)
Charles Holmes    [id number]    Jun 15, 1965  Primary Care Physician:Conroy, Aldean Jewett  Referring Physician: [name], PA-C [address] [place],  [place] [address]  Chief complaint:    Follow-up for shortness of breath Had PFT done today  HPI:  History of cardiac sarcoidosis currently on tapering doses of steroids, started on methotrexate Did have significant weight gain, muscle weakness with steroid use Has been on steroids for a prolonged period of time  Follows up with heart failure clinic  Continues to improve with pulmonary rehab  PFT today shows severe obstructive disease with significant bronchodilator response  History of nonischemic cardiomyopathy, systolic heart failure, history of ventricular tachycardia in the setting of cardiac sarcoidosis  Ejection fraction of 25 to 30% Most recent echocardiogram 08/26/2020 shows ejection fraction of less than 20%  ICD in place   Is short of breath with any significant exertion-this is getting better with therapy  Reformed smoker-was smoking about a pack a day quit in 2008 Worked many years in the [employer] 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   atorvastatin (LIPITOR) 80 MG tablet TAKE 1 TABLET BY MOUTH EVERY DAY   carvedilol (COREG) 3.125 MG tablet TAKE 1 TABLET BY MOUTH 2 TIMES DAILY.   dorzolamide-timolol (COSOPT) 22.3-6.8 MG/ML ophthalmic solution Place 1 drop into both eyes in the morning and at bedtime.   fluticasone-salmeterol (ADVAIR HFA) 230-21 MCG/ACT inhaler Inhale 2 puffs into the lungs 2 (two) times daily.   folic acid (FOLVITE) 1 MG tablet Take 1 tablet (1 mg total) by mouth daily.   furosemide (LASIX) 40 MG tablet Take 1 tablet (40 mg total) by mouth 2 (two) times a week. Every Monday and Friday and other days as needed   latanoprost  (XALATAN) 0.005 % ophthalmic solution Place 1 drop into both eyes in the morning and at bedtime.   losartan (COZAAR) 25 MG tablet TAKE 1 TABLET BY MOUTH EVERY DAY   methotrexate (RHEUMATREX) 2.5 MG tablet Take 7 tablets (17.5 mg total) by mouth once a week for 14 days, THEN 8 tablets (20 mg total) once a week. On WEDNESDAYS.   mexiletine (MEXITIL) 250 MG capsule TAKE 1 CAPSULE BY MOUTH 2 TIMES DAILY.   pantoprazole (PROTONIX) 40 MG tablet Take 40 mg by mouth daily before breakfast.   spironolactone (ALDACTONE) 25 MG tablet Take 0.5 tablets (12.5 mg total) by mouth daily.   No facility-administered encounter medications on file as of 12/24/2020.    Allergies as of 12/24/2020 - Review Complete 12/24/2020  Allergen Reaction Noted   Percocet [oxycodone-acetaminophen] Itching 12/16/2019    Past Medical History:  Diagnosis Date   CAD (coronary artery disease)    CHF (congestive heart failure) (HCC)    GERD (gastroesophageal reflux disease)    Hyperlipidemia    Hypertension    Systolic heart failure (HCC) 2021   LVEF 18%, RVEF 38% on cardiac MRI 12/19/2019. possible cardiac sarcoidosis.   Wide-complex tachycardia 2021   wears LifeVest    Past Surgical History:  Procedure Laterality Date   ICD IMPLANT N/A 02/20/2020   Procedure: ICD IMPLANT;  Surgeon: Regan Lemming, MD;  Location: [facility] INVASIVE CV LAB;  Service: Cardiovascular;  Laterality: N/A;   RIGHT/LEFT HEART CATH AND CORONARY ANGIOGRAPHY N/A 12/16/2019   Procedure: RIGHT/LEFT HEART CATH  AND CORONARY ANGIOGRAPHY;  Surgeon: Martinique, Peter M, MD;  Location: [facility] CV LAB;  Service: Cardiovascular;  Laterality: N/A;    Family History  Problem Relation Age of Onset   Hypertension Father    Heart disease Father    Heart disease Mother     Social History   Socioeconomic History   Marital status: Divorced    Spouse name: Not on file   Number of children: Not on file   Years of education: Not on file   Highest education  level: Not on file  Occupational History   Not on file  Tobacco Use   Smoking status: Former    Types: Cigarettes   Smokeless tobacco: Never  Vaping Use   Vaping Use: Unknown  Substance and Sexual Activity   Alcohol use: Yes    Alcohol/week: 6.0 standard drinks    Types: 6 Cans of beer per week   Drug use: Yes    Types: Marijuana    Comment: stopped using months ago d/t how it affected breathing   Sexual activity: Yes  Other Topics Concern   Not on file  Social History Narrative   Not on file   Social Determinants of Health   Financial Resource Strain: Not on file  Food Insecurity: Not on file  Transportation Needs: Not on file  Physical Activity: Not on file  Stress: Not on file  Social Connections: Not on file  Intimate Partner Violence: Not on file    Review of Systems  Constitutional:  Positive for fatigue.  Respiratory:  Positive for shortness of breath.   Psychiatric/Behavioral:  Positive for sleep disturbance.    Vitals:   12/24/20 1110  BP: 108/68  Pulse: 64  Temp: (!) 97.3 F (36.3 C)  SpO2: 96%     Physical Exam Constitutional:      Appearance: Normal appearance.  HENT:     Head: Normocephalic.     Mouth/Throat:     Mouth: Mucous membranes are moist.  Cardiovascular:     Rate and Rhythm: Normal rate and regular rhythm.     Heart sounds: No murmur heard.   No friction rub.  Pulmonary:     Effort: No respiratory distress.     Breath sounds: No stridor. No wheezing or rhonchi.     Comments: Decreased air movement bilaterally Musculoskeletal:     Cervical back: No rigidity or tenderness.  Neurological:     Mental Status: He is alert.  Psychiatric:        Mood and Affect: Mood normal.     Data Reviewed: Findings on cardiopulmonary exercise noted with PFT showing severe obstructive disease  Echocardiogram with ejection fraction of 20%  High-resolution CT scan of the chest in 2021-very minimal adenopathy, no pulmonary  scarring  Pulmonary function test reviewed with the patient showing severe obstructive disease with significant bronchodilator response, there is evidence of air trapping and mild reduction in diffusing capacity  Assessment:  History of cardiac sarcoidosis  Does not appear to have pulmonary sarcoidosis based off of the the last CT chest  We will continue Trelegy  Shortness of breath is multifactorial including deconditioning, steroid induced myopathy, reduced ejection fraction -Appears to be benefiting from cardiopulmonary exercises  Spirometric functions during CPX did reveal severe obstructive disease -With past history of smoking and occupational predisposition -We did discuss possible benefit with inhaler use  Rarely uses albuterol, has been using Trelegy  Chronic systolic heart failure Coronary artery disease  Plan/Recommendations:  Continue Trelegy  daily  Encouraged to continue rehab   weaned off steroids  Encouraged to stay active  Encouraged to call with any significant concerns  Follow-up in 3 months  I spent 30 minutes dedicated to the care of this patient on the date of this encounter to include previsit review of records, face-to-face time with the patient discussing conditions above, post visit ordering of testing, clinical documentation with electronic health record, making appropriate referrals as documented, and communicated necessary findings to members of the patient's care team  Sherrilyn Rist MD [place] 12/24/2020, 11:15 AM  CC: Cyndi Bender, PA-C

## 2020-12-24 NOTE — Progress Notes (Signed)
Full PFT performed today. °

## 2020-12-24 NOTE — Patient Instructions (Signed)
I will see you 3 months from now  Continue using current inhaler-Trelegy Make sure you rinse your mouth after use  Continue with pulmonary rehab  Continue with regular exercises as able  Call with significant concerns

## 2021-01-03 ENCOUNTER — Telehealth (HOSPITAL_COMMUNITY): Payer: Self-pay | Admitting: *Deleted

## 2021-01-03 NOTE — Telephone Encounter (Signed)
° °  PET scan auth pending

## 2021-01-24 ENCOUNTER — Other Ambulatory Visit (HOSPITAL_COMMUNITY): Payer: Self-pay | Admitting: Internal Medicine

## 2021-02-01 ENCOUNTER — Other Ambulatory Visit: Payer: Self-pay | Admitting: Internal Medicine

## 2021-02-24 ENCOUNTER — Ambulatory Visit (INDEPENDENT_AMBULATORY_CARE_PROVIDER_SITE_OTHER): Payer: Medicaid Other

## 2021-02-24 DIAGNOSIS — I472 Ventricular tachycardia, unspecified: Secondary | ICD-10-CM

## 2021-02-24 LAB — CUP PACEART REMOTE DEVICE CHECK
Battery Remaining Longevity: 116 mo
Battery Voltage: 3.01 V
Brady Statistic AP VP Percent: 7.27 %
Brady Statistic AP VS Percent: 11.33 %
Brady Statistic AS VP Percent: 43.05 %
Brady Statistic AS VS Percent: 38.35 %
Brady Statistic RA Percent Paced: 18.53 %
Brady Statistic RV Percent Paced: 50.01 %
Date Time Interrogation Session: 20230209033624
HighPow Impedance: 73 Ohm
Implantable Lead Implant Date: 20220204
Implantable Lead Implant Date: 20220204
Implantable Lead Location: 753859
Implantable Lead Location: 753860
Implantable Lead Model: 5076
Implantable Pulse Generator Implant Date: 20220204
Lead Channel Impedance Value: 342 Ohm
Lead Channel Impedance Value: 399 Ohm
Lead Channel Impedance Value: 418 Ohm
Lead Channel Pacing Threshold Amplitude: 0.5 V
Lead Channel Pacing Threshold Amplitude: 0.625 V
Lead Channel Pacing Threshold Pulse Width: 0.4 ms
Lead Channel Pacing Threshold Pulse Width: 0.4 ms
Lead Channel Sensing Intrinsic Amplitude: 2.375 mV
Lead Channel Sensing Intrinsic Amplitude: 2.375 mV
Lead Channel Sensing Intrinsic Amplitude: 8.125 mV
Lead Channel Sensing Intrinsic Amplitude: 8.125 mV
Lead Channel Setting Pacing Amplitude: 1.5 V
Lead Channel Setting Pacing Amplitude: 2 V
Lead Channel Setting Pacing Pulse Width: 0.4 ms
Lead Channel Setting Sensing Sensitivity: 0.3 mV

## 2021-03-01 NOTE — Progress Notes (Signed)
Remote ICD transmission.   

## 2021-03-14 ENCOUNTER — Other Ambulatory Visit: Payer: Self-pay

## 2021-03-14 ENCOUNTER — Ambulatory Visit (INDEPENDENT_AMBULATORY_CARE_PROVIDER_SITE_OTHER): Payer: Medicaid Other | Admitting: Cardiology

## 2021-03-14 ENCOUNTER — Encounter: Payer: Self-pay | Admitting: Cardiology

## 2021-03-14 VITALS — BP 99/64 | HR 72 | Ht 67.0 in | Wt 177.0 lb

## 2021-03-14 DIAGNOSIS — I5022 Chronic systolic (congestive) heart failure: Secondary | ICD-10-CM | POA: Diagnosis not present

## 2021-03-14 DIAGNOSIS — I472 Ventricular tachycardia, unspecified: Secondary | ICD-10-CM

## 2021-03-14 DIAGNOSIS — I442 Atrioventricular block, complete: Secondary | ICD-10-CM

## 2021-03-14 DIAGNOSIS — D8685 Sarcoid myocarditis: Secondary | ICD-10-CM

## 2021-03-14 NOTE — Progress Notes (Signed)
Electrophysiology Office Note   Date:  03/14/2021   ID:  GARLEN NAFFZIGER, DOB [birth year], MRN ID:[id number]  PCP:  Cyndi Bender, PA-C  Cardiologist: Kendell Bane Primary Electrophysiologist:  Antonious Omahoney Meredith Leeds, MD    Chief Complaint: ICD   History of Present Illness: Charles WICKE is a 56 y.o. male who is being seen today for the evaluation of ICD at the request of Cyndi Bender, [place]. Presenting today for electrophysiology evaluation.  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  He had an echo at [facility] that showed an ejection fraction of 25 to 30%.  Cardiac MRI showed an ejection fraction of 18% and likely cardiac sarcoidosis.  He is status post Medtronic dual-chamber ICD implanted 02/20/2020.  He had a PET scan at [facility] that showed an ejection fraction of 25% with active inflammation consistent with cardiac sarcoidosis.  He is currently on mexiletine as amiodarone caused him to have a tremor.  Today, denies symptoms of palpitations, chest pain, orthopnea, PND, lower extremity edema, claudication, dizziness, presyncope, syncope, bleeding, or neurologic sequela. The patient is tolerating medications without difficulties.  He has been feeling short of breath and fatigue.  This is been occurring over the last few weeks.  He went to the [facility] and had difficulty walking uphill.  This was in January.  In review of device interrogation, he is ventricular pacing nearly 100% of the time.  His OptiVol has also gone up.   Past Medical History:  Diagnosis Date   CAD (coronary artery disease)    CHF (congestive heart failure) (HCC)    GERD (gastroesophageal reflux disease)    Hyperlipidemia    Hypertension    Systolic heart failure ([place]) 2021   LVEF 18%, RVEF 38% on cardiac MRI 12/19/2019. possible cardiac sarcoidosis.   Wide-complex tachycardia 2021   wears LifeVest   Past Surgical History:   Procedure Laterality Date   ICD IMPLANT N/A 02/20/2020   Procedure: ICD IMPLANT;  Surgeon: Constance Haw, MD;  Location: [place];  Service: Cardiovascular;  Laterality: N/A;   RIGHT/LEFT HEART CATH AND CORONARY ANGIOGRAPHY N/A 12/16/2019   Procedure: RIGHT/LEFT HEART CATH AND CORONARY ANGIOGRAPHY;  Surgeon: Martinique, Peter M, MD;  Location: [place] [facility];  Service: Cardiovascular;  Laterality: N/A;     Current Outpatient Medications  Medication Sig Dispense Refill   aspirin EC 81 MG EC tablet Take 1 tablet (81 mg total) by mouth daily. 90 tablet 3   atorvastatin (LIPITOR) 80 MG tablet TAKE 1 TABLET BY MOUTH EVERY DAY 90 tablet 2   carvedilol (COREG) 3.125 MG tablet TAKE 1 TABLET BY MOUTH 2 TIMES DAILY. 180 tablet 2   dorzolamide-timolol (COSOPT) 22.3-6.8 MG/ML ophthalmic solution Place 1 drop into both eyes in the morning and at bedtime.     fluticasone-salmeterol (ADVAIR HFA) 230-21 MCG/ACT inhaler Inhale 2 puffs into the lungs 2 (two) times daily. 1 each 12   folic acid (FOLVITE) 1 MG tablet Take 1 tablet (1 mg total) by mouth daily. 30 tablet 6   furosemide (LASIX) 40 MG tablet Take 1 tablet (40 mg total) by mouth 2 (two) times a week. Every Monday and Friday and other days as needed 30 tablet 6   latanoprost (XALATAN) 0.005 % ophthalmic solution Place 1 drop into both eyes in the morning and at bedtime.     losartan (COZAAR) 25 MG tablet TAKE 1 TABLET BY MOUTH EVERY DAY 90  tablet 1   methotrexate (RHEUMATREX) 2.5 MG tablet Take 8 tablets (20 mg total) by mouth once a week. 32 tablet 1   mexiletine (MEXITIL) 250 MG capsule TAKE 1 CAPSULE BY MOUTH 2 TIMES DAILY. 180 capsule 1   pantoprazole (PROTONIX) 40 MG tablet Take 40 mg by mouth daily before breakfast.     spironolactone (ALDACTONE) 25 MG tablet Take 0.5 tablets (12.5 mg total) by mouth daily. 90 tablet 3   No current facility-administered medications for this visit.    Allergies:   Percocet  [oxycodone-acetaminophen]   Social History:  The patient  reports that he has quit smoking. His smoking use included cigarettes. He has never used smokeless tobacco. He reports current alcohol use of about 6.0 standard drinks per week. He reports current drug use. Drug: Marijuana.   Family History:  The patient's family history includes Heart disease in his father and mother; Hypertension in his father.   ROS:  Please see the history of present illness.   Otherwise, review of systems is positive for none.   All other systems are reviewed and negative.   PHYSICAL EXAM: VS:  BP 99/64    Pulse 72    Ht 5\' 7"  (1.702 m)    Wt 177 lb (80.3 kg)    SpO2 97%    BMI 27.72 kg/m  , BMI Body mass index is 27.72 kg/m. GEN: Well nourished, well developed, in no acute distress  HEENT: normal  Neck: no JVD, carotid bruits, or masses Cardiac: RRR; no murmurs, rubs, or gallops,no edema  Respiratory:  clear to auscultation bilaterally, normal work of breathing GI: soft, nontender, nondistended, + BS MS: no deformity or atrophy  Skin: warm and dry, device site well healed Neuro:  Strength and sensation are intact Psych: euthymic mood, full affect  EKG:  EKG is ordered today. Personal review of the ekg ordered shows atrial sensed, ventricular paced, rate 72  Personal review of the device interrogation today. Results in [place]: 04/26/2020: TSH 2.177 12/16/2020: ALT 86; B Natriuretic Peptide 154.9; BUN 15; Creatinine, Ser 1.39; Hemoglobin 14.6; Platelets 249; Potassium 3.9; Sodium 141    Lipid Panel     Component Value Date/Time   CHOL 237 (H) 12/18/2019 0147   TRIG 119 12/18/2019 0147   HDL 35 (L) 12/18/2019 0147   CHOLHDL 6.8 12/18/2019 0147   VLDL 24 12/18/2019 0147   LDLCALC 178 (H) 12/18/2019 0147     Wt Readings from Last 3 Encounters:  03/14/21 177 lb (80.3 kg)  12/24/20 178 lb (80.7 kg)  12/16/20 175 lb 9.6 oz (79.7 kg)      Other studies Reviewed: Additional  studies/ records that were reviewed today include: Right, left heart catheterization 11/15/2019 Review of the above records today demonstrates:  1. Single vessel borderline obstructive disease involving the proximal LAD.  2. Moderately elevated LV filling pressures 3. Moderate pulmonary HTN 4. Preserved cardiac output with index 2.41.   Cardiac MRI 12/20/2019 1. There is subendocardial late gadolinium enhancement suggestive of prior infarct, however does not occur in a coronary distribution, as there is circumferential subendocardial LGE at the base along with the mid inferoseptum/anteroseptum/anterior walls and apical septum. In addition there is nonischemic scar pattern with subepicardial LGE in the mid anterior and mid inferior walls. There is also LGE of the papillary muscles. 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 In addition, there are elevated T2 values in the basal to apical septum suggesting myocardial  edema. Recommend cardiac PET scan for further evaluation of sarcoidosis. Would also recommend chest CT to evaluate for evidence of pulmonary sarcoid.   2. Moderate LV dilation with severe systolic dysfunction (EF 123XX123). Diffuse hypokinesis with thinning/akinesis of basal inferoseptum, basal to mid anterior, mid anteroseptum, mid inferolateral, and apical septum   3.  Normal RV size with moderate systolic dysfunction (EF [id number])   ASSESSMENT AND PLAN:  1.  Ventricular tachycardia: Found in the setting of cardiac sarcoidosis.  Currently on prednisone and Bactrim for PJP prophylaxis.  Amiodarone was stopped and he has been started on mexiletine 250 mg twice daily.  High risk medication monitoring.  2.  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  PET scan February 2022 at [facility] showed active sarcoidosis.  Cardiac MRI consistent with sarcoidosis and an ejection fraction of 18%.  Currently on optimal medical therapy for heart failure  with losartan 25 mg daily, carvedilol 3.125 mg twice daily.  Unfortunately he is ventricular pacing a high percentage of the time.  He Keyli Duross need device upgrade.  Risk and benefits have been discussed which include bleeding, infection, tamponade, pneumothorax.  He understands these risks and is agreed to the procedure.  3.  Coronary artery disease: 70% LAD lesion.  No intervention at this time.  Continue aspirin and statin.  4.  Complete heart block: Has had worsening of conduction system disease.  We Kaly Mcquary plan for device upgrade as above.  5.  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   The patient does not have concerns regarding his medicines.  The following changes were made today: None  Labs/ tests ordered today include:  Orders Placed This Encounter  Procedures   EKG 12-Lead     Disposition:   FU with Kianni Lheureux 3 months  Signed, Corrin Sieling Meredith Leeds, MD  03/14/2021 12:56 PM     [address] [place] [address] [phone] (office) [phone] (fax)

## 2021-03-14 NOTE — Patient Instructions (Addendum)
Medication Instructions:  Your physician has recommended you make the following change in your medication:  TAKE Lasix daily for the next 3 days  *If you need a refill on your cardiac medications before your next appointment, please call your pharmacy*   Lab Work: None ordered   Testing/Procedures: None ordered   Follow-Up: At [facility], you and your health needs are our priority.  As part of our continuing mission to provide you with exceptional heart care, we have created designated Provider Care Teams.  These Care Teams include your primary Cardiologist (physician) and Advanced Practice Providers (APPs -  Physician Assistants and Nurse Practitioners) who all work together to provide you with the care you need, when you need it.  Your next appointment:   The nurse will call you to arrange a CRT upgrade  The format for your next appointment:   In Person  Provider:   Allegra Lai, MD    Thank you for choosing [facility]!!   Trinidad Curet, RN [phone]

## 2021-03-16 ENCOUNTER — Other Ambulatory Visit (HOSPITAL_COMMUNITY): Payer: Self-pay | Admitting: Internal Medicine

## 2021-03-16 ENCOUNTER — Other Ambulatory Visit: Payer: Self-pay | Admitting: Cardiology

## 2021-03-16 NOTE — Telephone Encounter (Signed)
This is a CHF pt 

## 2021-04-04 ENCOUNTER — Telehealth: Payer: Self-pay | Admitting: Cardiology

## 2021-04-04 DIAGNOSIS — I5022 Chronic systolic (congestive) heart failure: Secondary | ICD-10-CM

## 2021-04-04 NOTE — Telephone Encounter (Signed)
Returned pt call. ?Aware there isn't a procedure date option prior to 4/10, aware it will most likely be May. ?Aware I will follow up to schedule. ?Patient verbalized understanding and agreeable to plan.  ? ?

## 2021-04-04 NOTE — Telephone Encounter (Signed)
Patient calling to speak with [name] about scheduling his CRT upgrade. He says he cannot have it don't until after 4/10.  ?

## 2021-04-07 NOTE — Telephone Encounter (Signed)
Pt scheduled for BIV ICD upgrade on Jun 07, 2021 ? ?Will get labs/soap/instruction May 31, 2021 ? ?Work up complete ?

## 2021-04-14 NOTE — Progress Notes (Signed)
? ?ADVANCED HF CLINIC NOTE ? ?OTL:XBWIOM, Harrold Donath, PA-C ?Primary Cardiologist: Norman Herrlich, MD ? ?HPI: ? ?Mr Ganguly is a 56 y/o with HTN, GERD, systolic HF due to NICM, VT in setting of cardiac sarcoidosis ?  ?Admitted to [facility] 11/21 with CP. Found to have VT and EF 25-30% mild AI, MR and TR. Transferred to [facility]. Cath showed 70% prox LAD with no intervention.  He was discharged with Life Vest.  ? ?cMRI 12/19/19 EF 18% diffuse LGE suggestive of sarcoid. RV 38% ? ?Presented to HF Clinic 02/03/19 with recurrent VT. Amio started. Referred to [facility] for PET.  ?  ?Re-admitted  02/2020 with recurrent VT and LifeVest alarms. + AKI. Started prednisone and PJP prophylaxis. ICD implanted.  ?  ?PET @ [facility]. EF 25% with active inflammation c/w sarcoid. ? ?Amio switched due to mexilitene due to severe tremor ? ?CPX 8/22 ? ?FVC 1.37 (30%)      ?FEV1 0.65 (18%)        ?FEV1/FVC 48 (60%)        ?MVV 23 (16%)      ?(Unable to perform PFTs due to poor effort and hypoventilation) ? ?Resting HR: 71 Standing HR: 71 Peak HR: 130   (80% age predicted max HR)  ?BP rest: 98/66 Standing BP: 100/64 BP peak: 112/58  ?Peak VO2: 16.1 (49% predicted peak VO2)  ?VE/VCO2 slope:  19  ?OUES: 1.82  ?Peak RER: 1.17  ?PETCO2 at peak:  59  ? ?Saw Dr. Elberta Fortis in 2/23 was RV pacing 100% of time. Optivol up. Scheduled for Biv upgrade in 5/23 ? ?Here for routine f/u. Now of prednisone. On MTX 20mg  every week. Has finished CR. Feeling better. Has more stamina. Mild DOE when he pushes himself. Hasn't needed lasix in 3 weeks. ? ? ?Echo 08/26/20 EF < 20% severely dilated LV RV mildly decreased. Personally reviewed ? ?ICD interrogated personally: No VT. Volume looks good. Activity level 1-2 hrs.Personally reviewed ? ?ROS: All systems negative except as listed in HPI, PMH and Problem List. ? ?SH:  ?Social History  ? ?Socioeconomic History  ? Marital status: Divorced  ?  Spouse name: Not on file  ? Number of children: Not on file  ? Years of education: Not on file  ?  Highest education level: Not on file  ?Occupational History  ? Not on file  ?Tobacco Use  ? Smoking status: Former  ?  Types: Cigarettes  ? Smokeless tobacco: Never  ?Vaping Use  ? Vaping Use: Unknown  ?Substance and Sexual Activity  ? Alcohol use: Yes  ?  Alcohol/week: 6.0 standard drinks  ?  Types: 6 Cans of beer per week  ? Drug use: Yes  ?  Types: Marijuana  ?  Comment: stopped using months ago d/t how it affected breathing  ? Sexual activity: Yes  ?Other Topics Concern  ? Not on file  ?Social History Narrative  ? Not on file  ? ?Social Determinants of Health  ? ?Financial Resource Strain: Not on file  ?Food Insecurity: Not on file  ?Transportation Needs: Not on file  ?Physical Activity: Not on file  ?Stress: Not on file  ?Social Connections: Not on file  ?Intimate Partner Violence: Not on file  ? ? ?FH:  ?Family History  ?Problem Relation Age of Onset  ? Hypertension Father   ? Heart disease Father   ? Heart disease Mother   ? ? ?Past Medical History:  ?Diagnosis Date  ? CAD (coronary artery disease)   ?  CHF (congestive heart failure) (HCC)   ? GERD (gastroesophageal reflux disease)   ? Hyperlipidemia   ? Hypertension   ? Systolic heart failure (HCC) 2021  ? LVEF 18%, RVEF 38% on cardiac MRI 12/19/2019. possible cardiac sarcoidosis.  ? Wide-complex tachycardia 2021  ? wears LifeVest  ? ? ?Current Outpatient Medications  ?Medication Sig Dispense Refill  ? aspirin EC 81 MG EC tablet Take 1 tablet (81 mg total) by mouth daily. 90 tablet 3  ? atorvastatin (LIPITOR) 80 MG tablet TAKE 1 TABLET BY MOUTH EVERY DAY 90 tablet 2  ? carvedilol (COREG) 3.125 MG tablet TAKE 1 TABLET BY MOUTH 2 TIMES DAILY. 180 tablet 2  ? fluticasone-salmeterol (ADVAIR HFA) 230-21 MCG/ACT inhaler Inhale 2 puffs into the lungs 2 (two) times daily. 1 each 12  ? folic acid (FOLVITE) 1 MG tablet TAKE 1 TABLET BY MOUTH EVERY DAY 90 tablet 2  ? furosemide (LASIX) 40 MG tablet Take 40 mg by mouth as needed.    ? losartan (COZAAR) 25 MG tablet TAKE  1 TABLET BY MOUTH EVERY DAY 90 tablet 1  ? methotrexate (RHEUMATREX) 2.5 MG tablet TAKE 8 TABLETS (20 MG TOTAL) BY MOUTH ONCE A WEEK. 32 tablet 1  ? mexiletine (MEXITIL) 250 MG capsule TAKE 1 CAPSULE BY MOUTH 2 TIMES DAILY. 180 capsule 1  ? pantoprazole (PROTONIX) 40 MG tablet Take 40 mg by mouth daily before breakfast.    ? spironolactone (ALDACTONE) 25 MG tablet TAKE 1/2 TABLET BY MOUTH EVERY DAY 45 tablet 6  ? dorzolamide-timolol (COSOPT) 22.3-6.8 MG/ML ophthalmic solution Place 1 drop into both eyes in the morning and at bedtime. (Patient not taking: Reported on 04/15/2021)    ? latanoprost (XALATAN) 0.005 % ophthalmic solution Place 1 drop into both eyes in the morning and at bedtime. (Patient not taking: Reported on 04/15/2021)    ? ?No current facility-administered medications for this encounter.  ? ? ?Vitals:  ? 04/15/21 1059  ?BP: 110/78  ?Pulse: 65  ?SpO2: 96%  ?Weight: 81 kg (178 lb 9.6 oz)  ? ? ? ?PHYSICAL EXAM: ?General:  Well appearing. No resp difficulty ?HEENT: normal ?Neck: supple. no JVD. Carotids 2+ bilat; no bruits. No lymphadenopathy or thryomegaly appreciated. ?Cor: PMI nondisplaced. Regular rate & rhythm. No rubs, gallops or murmurs. ?Lungs: clear ?Abdomen: soft, nontender, nondistended. No hepatosplenomegaly. No bruits or masses. Good bowel sounds. ?Extremities: no cyanosis, clubbing, rash, edema ?Neuro: alert & orientedx3, cranial nerves grossly intact. moves all 4 extremities w/o difficulty. Affect pleasant ? ?ECG: A-sensed vpaced 69 Personally reviewed ? ? ?ASSESSMENT & PLAN: ? ?1. VT ?- ln setting of sarcoid heart disease  ?- Now off amio due to tremor. Dr. Elberta Fortis has switched to mexilitene  ?- now s/p ICD.  VT quiescent ?- Keep K > 4.0 Mg > 2.0 ?- Treatment of sarcoid as below ?  ?2. Chronic Systolic HF ?- Diagnosed 11/2019. Presented with VT. LHC 70% LAD  ?- Echo EF 25-30%  ?- cMRI 12/21 concerning for sarcoid and EF 18%.  ?- PET 2/22 at [facility] EF 25% + active sarcoid ?- Echo 08/26/20 EF <  20% severely dilated LV RV mildly decreased.  ?- CPX 8/22. Unable to complete PFTs due to hypoventilation and poor effort. pVO2 16.1 (49% predicted). Slope 19 RER: 1.17 PETCO2 59 ?- Improved NYHA II-early III ?- Volume status looks good. Uses lasix as needed ?- Continue Jardiance 10 ?- Increase losartan to 25 bid ?- Continue spiro 12.5 ?- Carvedilol 3.125 bid ?- Scheduled  for BIV ICD upgrade in 5/23 with Dr. Elberta Fortis ?- Repeat echo at next visit ? ?3. CAD ?- LHC 12/07/19 70-% LAD, no intervention ?- No s/s ischemia ?- Continue statin and aspirin.  ? ?4. Cardiac sarcoid ?- PET 2/22 at [facility] EF 25% + active sarcoid ?- D/w Dr. Jeralyn Ruths at [facility].  ?- Has completed prednisone. Now on MTX 20 weekly. Continue folic acid ?- We discussed EM biopsy but patient is reluctant  ?- Need f/u PET scan but still has outstanding bill at [employer] today with CBC and LFTs ? ?Arvilla Meres, MD  ?11:21 AM ? ? ?

## 2021-04-15 ENCOUNTER — Ambulatory Visit (HOSPITAL_COMMUNITY)
Admission: RE | Admit: 2021-04-15 | Discharge: 2021-04-15 | Disposition: A | Discharge status: Home / Self Care | Payer: Medicaid Other | Source: Ambulatory Visit | Attending: Internal Medicine | Admitting: Internal Medicine

## 2021-04-15 ENCOUNTER — Encounter (HOSPITAL_COMMUNITY): Payer: Self-pay | Admitting: Internal Medicine

## 2021-04-15 VITALS — BP 110/78 | HR 65 | Wt 178.6 lb

## 2021-04-15 DIAGNOSIS — I5022 Chronic systolic (congestive) heart failure: Secondary | ICD-10-CM | POA: Insufficient documentation

## 2021-04-15 DIAGNOSIS — D8685 Sarcoid myocarditis: Secondary | ICD-10-CM

## 2021-04-15 DIAGNOSIS — Z95810 Presence of automatic (implantable) cardiac defibrillator: Secondary | ICD-10-CM | POA: Diagnosis not present

## 2021-04-15 DIAGNOSIS — Z7982 Long term (current) use of aspirin: Secondary | ICD-10-CM | POA: Diagnosis not present

## 2021-04-15 DIAGNOSIS — I472 Ventricular tachycardia, unspecified: Secondary | ICD-10-CM | POA: Insufficient documentation

## 2021-04-15 DIAGNOSIS — I110 Hypertensive heart disease with heart failure: Secondary | ICD-10-CM | POA: Diagnosis present

## 2021-04-15 DIAGNOSIS — I2510 Atherosclerotic heart disease of native coronary artery without angina pectoris: Secondary | ICD-10-CM | POA: Insufficient documentation

## 2021-04-15 DIAGNOSIS — Z79899 Other long term (current) drug therapy: Secondary | ICD-10-CM | POA: Insufficient documentation

## 2021-04-15 DIAGNOSIS — Z7984 Long term (current) use of oral hypoglycemic drugs: Secondary | ICD-10-CM | POA: Diagnosis not present

## 2021-04-15 DIAGNOSIS — Z79631 Long term (current) use of antimetabolite agent: Secondary | ICD-10-CM | POA: Diagnosis not present

## 2021-04-15 LAB — CBC
HCT: 41.3 % (ref 39.0–52.0)
Hemoglobin: 13.6 g/dL (ref 13.0–17.0)
MCH: 32.9 pg (ref 26.0–34.0)
MCHC: 32.9 g/dL (ref 30.0–36.0)
MCV: 100 fL (ref 80.0–100.0)
Platelets: 168 10*3/uL (ref 150–400)
RBC: 4.13 MIL/uL — ABNORMAL LOW (ref 4.22–5.81)
RDW: 14.2 % (ref 11.5–15.5)
WBC: 4.3 10*3/uL (ref 4.0–10.5)
nRBC: 0 % (ref 0.0–0.2)

## 2021-04-15 LAB — COMPREHENSIVE METABOLIC PANEL
ALT: 53 U/L — ABNORMAL HIGH (ref 0–44)
AST: 44 U/L — ABNORMAL HIGH (ref 15–41)
Albumin: 3.8 g/dL (ref 3.5–5.0)
Alkaline Phosphatase: 72 U/L (ref 38–126)
Anion gap: 5 (ref 5–15)
BUN: 16 mg/dL (ref 6–20)
CO2: 30 mmol/L (ref 22–32)
Calcium: 9.1 mg/dL (ref 8.9–10.3)
Chloride: 104 mmol/L (ref 98–111)
Creatinine, Ser: 1.15 mg/dL (ref 0.61–1.24)
GFR, Estimated: >60 mL/min (ref >60)
Glucose, Bld: 86 mg/dL (ref 70–99)
Potassium: 4.5 mmol/L (ref 3.5–5.1)
Sodium: 139 mmol/L (ref 135–145)
Total Bilirubin: 1.2 mg/dL (ref 0.3–1.2)
Total Protein: 6.2 g/dL — ABNORMAL LOW (ref 6.5–8.1)

## 2021-04-15 LAB — BRAIN NATRIURETIC PEPTIDE: B Natriuretic Peptide: 285.5 pg/mL — ABNORMAL HIGH (ref 0.0–100.0)

## 2021-04-15 MED ORDER — LOSARTAN POTASSIUM 25 MG PO TABS: 25.0000 mg | ORAL_TABLET | Freq: Two times a day (BID) | ORAL | 1 refills | Status: DC

## 2021-04-15 NOTE — Addendum Note (Signed)
Encounter addended by: Noralee Space, RN on: 04/15/2021 3:18 PM ? Actions taken: Clinical Note Signed

## 2021-04-15 NOTE — Patient Instructions (Signed)
Medication Changes: ? ?Increase Losartan to 25 mg Twice daily  ? ?Lab Work: ? ?Labs done today, your results will be available in MyChart, we will contact you for abnormal readings. ? ? ?Testing/Procedures: ? ?Your provider has recommended you have a Cardiac PET Scan at [facility]. We will get this approved with your insurance company and get it scheduled for you. We will call you with the date and time and instructions. [facility] will call you to review this information the day before the test. ? ? ?Referrals: ? ?None ? ?Special Instructions // Education: ? ?Do the following things EVERYDAY: ?Weigh yourself in the morning before breakfast. Write it down and keep it in a log. ?Take your medicines as prescribed ?Eat low salt foods--Limit salt (sodium) to 2000 mg per day.  ?Stay as active as you can everyday ?Limit all fluids for the day to less than 2 liters ? ? ?Follow-Up in: 4 months with an echocardiogram, **PLEASE CALL OUR OFFICE IN MAY TO SCHEDULE THESE APPOINTMENTS ? ?At the [facility], you and your health needs are our priority. We have a designated team specialized in the treatment of Heart Failure. This Care Team includes your primary Heart Failure Specialized Cardiologist (physician), Advanced Practice Providers (APPs- Physician Assistants and Nurse Practitioners), and Pharmacist who all work together to provide you with the care you need, when you need it.  ? ?You may see any of the following providers on your designated Care Team at your next follow up: ? ?Dr Glori Bickers ?Dr Loralie Champagne ?Darrick Grinder, NP ?Lyda Jester, PA ?Jessica Milford,NP ?Marlyce Huge, PA ?Audry Riles, PharmD ? ? ?Please be sure to bring in all your medications bottles to every appointment.  ? ?Need to Contact us: ? ?If you have any questions or concerns before your next appointment please send [place] a message through [place] or call our office at [phone].   ? ?TO LEAVE A MESSAGE FOR THE NURSE SELECT OPTION 2,  PLEASE LEAVE A MESSAGE INCLUDING: ?YOUR NAME ?DATE OF BIRTH ?CALL BACK NUMBER ?REASON FOR CALL**this is important as we prioritize the call backs ? ?YOU WILL RECEIVE A CALL BACK THE SAME DAY AS LONG AS YOU CALL BEFORE 4:00 PM ? ? ?

## 2021-04-15 NOTE — Progress Notes (Signed)
Pt's disability forms completed and signed by Dr Gala Romney, given to pt to submit to [employer]  ?

## 2021-04-28 ENCOUNTER — Telehealth (HOSPITAL_COMMUNITY): Payer: Self-pay | Admitting: *Deleted

## 2021-05-03 ENCOUNTER — Other Ambulatory Visit: Payer: Self-pay | Admitting: Internal Medicine

## 2021-05-18 ENCOUNTER — Other Ambulatory Visit (HOSPITAL_COMMUNITY): Payer: Self-pay | Admitting: *Deleted

## 2021-05-18 MED ORDER — METHOTREXATE 2.5 MG PO TABS: 20.0000 mg | ORAL_TABLET | ORAL | 1 refills | Status: DC

## 2021-05-26 ENCOUNTER — Ambulatory Visit (INDEPENDENT_AMBULATORY_CARE_PROVIDER_SITE_OTHER): Payer: Medicaid Other

## 2021-05-26 DIAGNOSIS — I442 Atrioventricular block, complete: Secondary | ICD-10-CM

## 2021-05-26 LAB — CUP PACEART REMOTE DEVICE CHECK
Battery Remaining Longevity: 111 mo
Battery Voltage: 3.01 V
Brady Statistic AP VP Percent: 8.28 %
Brady Statistic AP VS Percent: 2.75 %
Brady Statistic AS VP Percent: 67.71 %
Brady Statistic AS VS Percent: 21.26 %
Brady Statistic RA Percent Paced: 11.01 %
Brady Statistic RV Percent Paced: 75.62 %
Date Time Interrogation Session: 20230511052723
HighPow Impedance: 82 Ohm
Implantable Lead Implant Date: 20220204
Implantable Lead Implant Date: 20220204
Implantable Lead Location: 753859
Implantable Lead Location: 753860
Implantable Lead Model: 5076
Implantable Pulse Generator Implant Date: 20220204
Lead Channel Impedance Value: 342 Ohm
Lead Channel Impedance Value: 418 Ohm
Lead Channel Impedance Value: 418 Ohm
Lead Channel Pacing Threshold Amplitude: 0.5 V
Lead Channel Pacing Threshold Amplitude: 0.75 V
Lead Channel Pacing Threshold Pulse Width: 0.4 ms
Lead Channel Pacing Threshold Pulse Width: 0.4 ms
Lead Channel Sensing Intrinsic Amplitude: 2.5 mV
Lead Channel Sensing Intrinsic Amplitude: 2.5 mV
Lead Channel Sensing Intrinsic Amplitude: 8.375 mV
Lead Channel Sensing Intrinsic Amplitude: 8.375 mV
Lead Channel Setting Pacing Amplitude: 1.5 V
Lead Channel Setting Pacing Amplitude: 2 V
Lead Channel Setting Pacing Pulse Width: 0.4 ms
Lead Channel Setting Sensing Sensitivity: 0.3 mV

## 2021-05-31 ENCOUNTER — Other Ambulatory Visit: Payer: Medicaid Other | Admitting: *Deleted

## 2021-05-31 DIAGNOSIS — I5022 Chronic systolic (congestive) heart failure: Secondary | ICD-10-CM

## 2021-05-31 LAB — CBC WITH DIFFERENTIAL/PLATELET
Basophils Absolute: 0 10*3/uL (ref 0.0–0.2)
Basos: 1 %
EOS (ABSOLUTE): 0.1 10*3/uL (ref 0.0–0.4)
Eos: 2 %
Hematocrit: 42.6 % (ref 37.5–51.0)
Hemoglobin: 14.3 g/dL (ref 13.0–17.7)
Lymphocytes Absolute: 0.9 10*3/uL (ref 0.7–3.1)
Lymphs: 16 %
MCH: 32.9 pg (ref 26.6–33.0)
MCHC: 33.6 g/dL (ref 31.5–35.7)
MCV: 98 fL — ABNORMAL HIGH (ref 79–97)
Monocytes Absolute: 0.7 10*3/uL (ref 0.1–0.9)
Monocytes: 12 %
Neutrophils Absolute: 3.9 10*3/uL (ref 1.4–7.0)
Neutrophils: 69 %
Platelets: 203 10*3/uL (ref 150–450)
RBC: 4.34 x10E6/uL (ref 4.14–5.80)
RDW: 15.8 % — ABNORMAL HIGH (ref 11.6–15.4)
WBC: 5.6 10*3/uL (ref 3.4–10.8)

## 2021-05-31 LAB — BASIC METABOLIC PANEL
BUN/Creatinine Ratio: 11 (ref 9–20)
BUN: 12 mg/dL (ref 6–24)
CO2: 31 mmol/L — ABNORMAL HIGH (ref 20–29)
Calcium: 9.8 mg/dL (ref 8.7–10.2)
Chloride: 104 mmol/L (ref 96–106)
Creatinine, Ser: 1.08 mg/dL (ref 0.76–1.27)
Glucose: 79 mg/dL (ref 70–99)
Potassium: 4.9 mmol/L (ref 3.5–5.2)
Sodium: 143 mmol/L (ref 134–144)
eGFR: 80 mL/min/{1.73_m2} (ref >59)

## 2021-06-02 NOTE — Progress Notes (Signed)
Remote ICD transmission.   

## 2021-06-07 ENCOUNTER — Other Ambulatory Visit: Payer: Self-pay

## 2021-06-07 ENCOUNTER — Ambulatory Visit (HOSPITAL_COMMUNITY)
Admission: RE | Admit: 2021-06-07 | Discharge: 2021-06-07 | Disposition: A | Discharge status: Home / Self Care | Payer: Medicaid Other | Source: Ambulatory Visit | Attending: Cardiology | Admitting: Cardiology

## 2021-06-07 ENCOUNTER — Encounter (HOSPITAL_COMMUNITY)
Admission: RE | Disposition: A | Discharge status: Home / Self Care | Payer: Medicaid Other | Source: Ambulatory Visit | Attending: Cardiology

## 2021-06-07 ENCOUNTER — Ambulatory Visit (HOSPITAL_COMMUNITY): Payer: Medicaid Other

## 2021-06-07 DIAGNOSIS — I428 Other cardiomyopathies: Secondary | ICD-10-CM | POA: Insufficient documentation

## 2021-06-07 DIAGNOSIS — Z87891 Personal history of nicotine dependence: Secondary | ICD-10-CM | POA: Diagnosis not present

## 2021-06-07 DIAGNOSIS — Z4502 Encounter for adjustment and management of automatic implantable cardiac defibrillator: Secondary | ICD-10-CM | POA: Diagnosis present

## 2021-06-07 DIAGNOSIS — Z79899 Other long term (current) drug therapy: Secondary | ICD-10-CM | POA: Insufficient documentation

## 2021-06-07 DIAGNOSIS — I5022 Chronic systolic (congestive) heart failure: Secondary | ICD-10-CM | POA: Insufficient documentation

## 2021-06-07 DIAGNOSIS — I472 Ventricular tachycardia, unspecified: Secondary | ICD-10-CM | POA: Diagnosis not present

## 2021-06-07 DIAGNOSIS — I110 Hypertensive heart disease with heart failure: Secondary | ICD-10-CM | POA: Insufficient documentation

## 2021-06-07 DIAGNOSIS — D869 Sarcoidosis, unspecified: Secondary | ICD-10-CM | POA: Diagnosis not present

## 2021-06-07 DIAGNOSIS — I442 Atrioventricular block, complete: Secondary | ICD-10-CM | POA: Insufficient documentation

## 2021-06-07 HISTORY — PX: BIV UPGRADE: EP1202

## 2021-06-07 SURGERY — BIV UPGRADE
Anesthesia: LOCAL

## 2021-06-07 MED ORDER — POVIDONE-IODINE 10 % EX SWAB
2.0000 "application " | Freq: Once | CUTANEOUS | Status: AC
  Administered 2021-06-07: 2 via TOPICAL

## 2021-06-07 MED ORDER — ONDANSETRON HCL 4 MG/2ML IJ SOLN: 4.0000 mg | Freq: Four times a day (QID) | INTRAMUSCULAR | Status: DC | PRN

## 2021-06-07 MED ORDER — SODIUM CHLORIDE 0.9 % IV SOLN: INTRAVENOUS | Status: DC

## 2021-06-07 MED ORDER — CHLORHEXIDINE GLUCONATE 4 % EX LIQD
4.0000 "application " | Freq: Once | CUTANEOUS | Status: DC
  Filled 2021-06-07: qty 60

## 2021-06-07 MED ORDER — MIDAZOLAM HCL 5 MG/5ML IJ SOLN
INTRAMUSCULAR | Status: AC
  Filled 2021-06-07: qty 5

## 2021-06-07 MED ORDER — SODIUM CHLORIDE 0.9 % IV SOLN
INTRAVENOUS | Status: AC
  Filled 2021-06-07: qty 2

## 2021-06-07 MED ORDER — CEFAZOLIN SODIUM-DEXTROSE 2-4 GM/100ML-% IV SOLN
INTRAVENOUS | Status: AC
  Filled 2021-06-07: qty 100

## 2021-06-07 MED ORDER — SODIUM CHLORIDE 0.9 % IV SOLN
80.0000 mg | INTRAVENOUS | Status: AC
  Administered 2021-06-07: 80 mg
  Filled 2021-06-07: qty 2

## 2021-06-07 MED ORDER — CEFAZOLIN SODIUM-DEXTROSE 2-4 GM/100ML-% IV SOLN
2.0000 g | INTRAVENOUS | Status: AC
  Administered 2021-06-07: 2 g via INTRAVENOUS
  Filled 2021-06-07: qty 100

## 2021-06-07 MED ORDER — FENTANYL CITRATE (PF) 100 MCG/2ML IJ SOLN
INTRAMUSCULAR | Status: AC
  Filled 2021-06-07: qty 2

## 2021-06-07 MED ORDER — IODIXANOL 320 MG/ML IV SOLN
INTRAVENOUS | Status: DC | PRN
  Administered 2021-06-07: 15 mL
  Administered 2021-06-07: 5 mL

## 2021-06-07 MED ORDER — FENTANYL CITRATE (PF) 100 MCG/2ML IJ SOLN
INTRAMUSCULAR | Status: DC | PRN
  Administered 2021-06-07 (×4): 25 ug via INTRAVENOUS

## 2021-06-07 MED ORDER — LIDOCAINE HCL (PF) 1 % IJ SOLN
INTRAMUSCULAR | Status: AC
  Filled 2021-06-07: qty 60

## 2021-06-07 MED ORDER — MIDAZOLAM HCL 5 MG/5ML IJ SOLN
INTRAMUSCULAR | Status: DC | PRN
  Administered 2021-06-07 (×4): 1 mg via INTRAVENOUS

## 2021-06-07 MED ORDER — LIDOCAINE HCL (PF) 1 % IJ SOLN
INTRAMUSCULAR | Status: DC | PRN
  Administered 2021-06-07: 60 mL

## 2021-06-07 MED ORDER — HEPARIN (PORCINE) IN NACL 1000-0.9 UT/500ML-% IV SOLN
INTRAVENOUS | Status: DC | PRN
  Administered 2021-06-07: 500 mL

## 2021-06-07 MED ORDER — CEFAZOLIN SODIUM-DEXTROSE 1-4 GM/50ML-% IV SOLN
1.0000 g | Freq: Four times a day (QID) | INTRAVENOUS | Status: DC
  Administered 2021-06-07: 1 g via INTRAVENOUS
  Filled 2021-06-07: qty 50

## 2021-06-07 SURGICAL SUPPLY — 17 items
BALLN ATTAIN 80 (BALLOONS) ×2
BALLOON ATTAIN 80 (BALLOONS) IMPLANT
CABLE SURGICAL S-101-97-12 (CABLE) ×2 IMPLANT
CATH ATTAIN COM SURV 6250V-MB2 (CATHETERS) ×1 IMPLANT
ICD CLARIA MRI DTMA1QQ (ICD Generator) ×1 IMPLANT
KIT MICROPUNCTURE NIT STIFF (SHEATH) ×1 IMPLANT
LEAD ATTAIN PERFORMA S 4598-88 (Lead) ×1 IMPLANT
PAD DEFIB RADIO PHYSIO CONN (PAD) ×2 IMPLANT
POUCH AIGIS-R ANTIBACT ICD (Mesh General) ×2 IMPLANT
POUCH AIGIS-R ANTIBACT ICD LRG (Mesh General) IMPLANT
SHEATH 9FR PRELUDE SNAP 13 (SHEATH) ×1 IMPLANT
SHEATH PROBE COVER 6X72 (BAG) ×2 IMPLANT
SLITTER 6232ADJ (MISCELLANEOUS) ×1 IMPLANT
TRAY PACEMAKER INSERTION (PACKS) ×2 IMPLANT
WIRE ACUITY WHISPER EDS 4648 (WIRE) ×1 IMPLANT
WIRE HI TORQ VERSACORE-J 145CM (WIRE) ×1 IMPLANT
WIRE MAILMAN 182CM (WIRE) ×1 IMPLANT

## 2021-06-07 NOTE — H&P (Signed)
Electrophysiology Office Note   Date:  06/07/2021   ID:  JEVAN BERTELSEN, DOB 1965-11-24, MRN [id number]  PCP:  Cyndi Bender, PA-C  Cardiologist: Kendell Bane Primary Electrophysiologist:  Lenorris Karger Meredith Leeds, MD    Chief Complaint: ICD   History of Present Illness: NIKOLAUS MALECKI is a 56 y.o. male who is being seen today for the evaluation of ICD at the request of No ref. provider found. Presenting today for electrophysiology evaluation.  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  He had an echo at [facility] that showed an ejection fraction of 25 to 30%.  Cardiac MRI showed an ejection fraction of 18% and likely cardiac sarcoidosis.  He is status post Med[employer]onic dual-chamber ICD implanted 02/20/2020.  He had a PET scan at [facility] that showed an ejection fraction of 25% with active inflammation consistent with cardiac sarcoidosis.  He is currently on mexiletine as amiodarone caused him to have a tremor.  Today, denies symptoms of palpitations, chest pain, shortness of breath, orthopnea, PND, lower extremity edema, claudication, dizziness, presyncope, syncope, bleeding, or neurologic sequela. The patient is tolerating medications without difficulties. Plan for CRT-D upgrade today.    Past Medical History:  Diagnosis Date   CAD (coronary artery disease)    CHF (congestive heart failure) (HCC)    GERD (gastroesophageal reflux disease)    Hyperlipidemia    Hypertension    Systolic heart failure ([place]) 2021   LVEF 18%, RVEF 38% on cardiac MRI 12/19/2019. possible cardiac sarcoidosis.   Wide-complex tachycardia 2021   wears LifeVest   Past Surgical History:  Procedure Laterality Date   ICD IMPLANT N/A 02/20/2020   Procedure: ICD IMPLANT;  Surgeon: Constance Haw, MD;  Location: [place] [facility];  Service: Cardiovascular;  Laterality: N/A;   RIGHT/LEFT HEART CATH AND CORONARY ANGIOGRAPHY N/A  12/16/2019   Procedure: RIGHT/LEFT HEART CATH AND CORONARY ANGIOGRAPHY;  Surgeon: Martinique, Peter M, MD;  Location: [place] [facility];  Service: Cardiovascular;  Laterality: N/A;     No current facility-administered medications for this encounter.   Current Outpatient Medications  Medication Sig Dispense Refill   aspirin EC 81 MG EC tablet Take 1 tablet (81 mg total) by mouth daily. 90 tablet 3   atorvastatin (LIPITOR) 80 MG tablet TAKE 1 TABLET BY MOUTH EVERY DAY 90 tablet 2   carvedilol (COREG) 3.125 MG tablet TAKE 1 TABLET BY MOUTH 2 TIMES DAILY. 180 tablet 2   dorzolamide-timolol (COSOPT) 22.3-6.8 MG/ML ophthalmic solution Place 1 drop into both eyes in the morning and at bedtime.     fluticasone-salmeterol (ADVAIR HFA) 230-21 MCG/ACT inhaler Inhale 2 puffs into the lungs 2 (two) times daily. 1 each 12   folic acid (FOLVITE) 1 MG tablet TAKE 1 TABLET BY MOUTH EVERY DAY 90 tablet 2   furosemide (LASIX) 40 MG tablet Take 40 mg by mouth daily as needed for fluid.     losartan (COZAAR) 25 MG tablet Take 1 tablet (25 mg total) by mouth in the morning and at bedtime. 180 tablet 1   methotrexate (RHEUMATREX) 2.5 MG tablet Take 8 tablets (20 mg total) by mouth once a week. (Patient taking differently: Take 20 mg by mouth every Wednesday.) 32 tablet 1   mexiletine (MEXITIL) 250 MG capsule TAKE 1 CAPSULE BY MOUTH 2 TIMES DAILY. 180 capsule 1   Multiple Vitamins-Minerals (MULTIVITAMIN WITH MINERALS) tablet Take 1 tablet by mouth daily.     pantoprazole (  PROTONIX) 40 MG tablet Take 40 mg by mouth daily before breakfast.     spironolactone (ALDACTONE) 25 MG tablet TAKE 1/2 TABLET BY MOUTH EVERY DAY 45 tablet 6   latanoprost (XALATAN) 0.005 % ophthalmic solution Place 1 drop into both eyes at bedtime.      Allergies:   Percocet [oxycodone-acetaminophen]   Social History:  The patient  reports that he has quit smoking. His smoking use included cigarettes. He has never used smokeless tobacco. He  reports current alcohol use of about 6.0 standard drinks per week. He reports current drug use. Drug: Marijuana.   Family History:  The patient's family history includes Heart disease in his father and mother; Hypertension in his father.   ROS:  Please see the history of present illness.   Otherwise, review of systems is positive for none.   All other systems are reviewed and negative.   PHYSICAL EXAM: VS:  There were no vitals taken for this visit. , BMI There is no height or weight on file to calculate BMI. GEN: Well nourished, well developed, in no acute distress  HEENT: normal  Neck: no JVD, carotid bruits, or masses Cardiac: RRR; no murmurs, rubs, or gallops,no edema  Respiratory:  clear to auscultation bilaterally, normal work of breathing GI: soft, nontender, nondistended, + BS MS: no deformity or atrophy  Skin: warm and dry Neuro:  Strength and sensation are intact Psych: euthymic mood, full affect  Recent Labs: 04/15/2021: ALT 53; B Natriuretic Peptide 285.5 05/31/2021: BUN 12; Creatinine, Ser 1.08; Hemoglobin 14.3; Platelets 203; Potassium 4.9; Sodium 143    Lipid Panel     Component Value Date/Time   CHOL 237 (H) 12/18/2019 0147   TRIG 119 12/18/2019 0147   HDL 35 (L) 12/18/2019 0147   CHOLHDL 6.8 12/18/2019 0147   VLDL 24 12/18/2019 0147   LDLCALC 178 (H) 12/18/2019 0147     Wt Readings from Last 3 Encounters:  04/15/21 81 kg  03/14/21 80.3 kg  12/24/20 80.7 kg      Other studies Reviewed: Additional studies/ records that were reviewed today include: Right, left heart catheterization 11/15/2019 Review of the above records today demonstrates:  1. Single vessel borderline obstructive disease involving the proximal LAD.  2. Moderately elevated LV filling pressures 3. Moderate pulmonary HTN 4. Preserved cardiac output with index 2.41.   Cardiac MRI 12/20/2019 1. There is subendocardial late gadolinium enhancement suggestive of prior infarct, however does not  occur in a coronary distribution, as there is circumferential subendocardial LGE at the base along with the mid inferoseptum/anteroseptum/anterior walls and apical septum. In addition there is nonischemic scar pattern with subepicardial LGE in the mid anterior and mid inferior walls. There is also LGE of the papillary muscles. 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 In addition, there are elevated T2 values in the basal to apical septum suggesting myocardial edema. Recommend cardiac PET scan for further evaluation of sarcoidosis. Would also recommend chest CT to evaluate for evidence of pulmonary sarcoid.   2. Moderate LV dilation with severe systolic dysfunction (EF 123XX123). Diffuse hypokinesis with thinning/akinesis of basal inferoseptum, basal to mid anterior, mid anteroseptum, mid inferolateral, and apical septum   3.  Normal RV size with moderate systolic dysfunction (EF [id number])   ASSESSMENT AND PLAN:  1.  Ventricular tachycardia: Found in the setting of cardiac sarcoidosis.  Currently on prednisone and Bactrim for PJP prophylaxis.  Amiodarone was stopped and he has been started on mexiletine 250 mg twice  daily.  High risk medication monitoring.  2.  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  The various methods of treatment have been discussed with the patient and family. After consideration of risks, benefits and other options for treatment, the patient has consented to  Procedure(s): CRTD upgrade as a surgical intervention .  Risks include but not limited to bleeding, infection, pneumothorax, perforation, tamponade, vascular damage, renal failure, MI, stroke, death, and lead dislodgement . The patient's history has been reviewed, patient examined, no change in status, stable for surgery.  I have reviewed the patient's chart and labs.  Questions were answered  to the patient's satisfaction.    Tysin Salada Curt Bears, MD 06/07/2021 7:45 AM   ICD Criteria  Current LVEF:<20%. Within 12 months prior to implant: Yes   Heart failure history: Yes, Class II  Cardiomyopathy history: Yes, Non-Ischemic Cardiomyopathy.  Atrial Fibrillation/Atrial Flutter: No.  Ventricular tachycardia history: Yes, Hemodynamic instability present. VT Type: Non-Sustained Ventricular Tachycardia.  Cardiac arrest history: No.  History of syndromes with risk of sudden death: No.  Previous ICD: Yes, Reason for ICD:  Primary prevention.  Current ICD indication: Secondary  PPM indication: Yes. Pacing type: Ventricular. Greater than 40% RV pacing requirement anticipated. Indication: Complete Heart Block  Class I or II Bradycardia indication present: Yes  Beta Blocker therapy for 3 or more months: Yes, prescribed.   Ace Inhibitor/ARB therapy for 3 or more months: Yes, prescribed.    I have seen REDGE HARTT is a 56 y.o. malepre-procedural and has been referred by Bensimhon for consideration of ICD implant for secondary prevention of sudden death.  The patient's chart has been reviewed and they meet criteria for ICD implant.  I have had a thorough discussion with the patient reviewing options.  The patient and their family (if available) have had opportunities to ask questions and have them answered. The patient and I have decided together through the [place] [employer] to implant ICD at this time.  Risks, benefits, alternatives to ICD implantation were discussed in detail with the patient today. The patient  understands that the risks include but are not limited to bleeding, infection, pneumothorax, perforation, tamponade, vascular damage, renal failure, MI, stroke, death, inappropriate shocks, and lead dislodgement and  wishes to proceed.

## 2021-06-07 NOTE — Discharge Instructions (Signed)
    Supplemental Discharge Instructions for  Pacemaker/Defibrillator Patients  Tomorrow, 06/08/21, send in a device transmission  Activity No heavy lifting or vigorous activity with your left/right arm for 6 to 8 weeks.  Do not raise your left/right arm above your head for one week.  Gradually raise your affected arm as drawn below.              06/12/21                    06/13/21                   06/14/21                  06/15/21 __  NO DRIVING until cleared to at your wound check visit.  WOUND CARE Keep the wound area clean and dry.  Do not get this area wet , no showers until cleared toa t your wound check visit. Tomorrow, 06/08/21, remove the arm sling Tomorrow, 06/08/21 remove the outer plastic bandage.  Underneath the plastic bandage there are steri strips (paper tapes), DO NOT remove these. The tape/steri-strips on your wound will fall off; do not pull them off.  No bandage is needed on the site.  DO  NOT apply any creams, oils, or ointments to the wound area. If you notice any drainage or discharge from the wound, any swelling or bruising at the site, or you develop a fever > 101? F after you are discharged home, call the office at once.  Special Instructions You are still able to use cellular telephones; use the ear opposite the side where you have your pacemaker/defibrillator.  Avoid carrying your cellular phone near your device. When traveling through airports, show security personnel your identification card to avoid being screened in the metal detectors.  Ask the security personnel to use the hand wand. Avoid arc welding equipment, MRI testing (magnetic resonance imaging), TENS units (transcutaneous nerve stimulators).  Call the office for questions about other devices. Avoid electrical appliances that are in poor condition or are not properly grounded. Microwave ovens are safe to be near or to operate.  Additional information for defibrillator patients should your device go  off: If your device goes off ONCE and you feel fine afterward, notify the device clinic nurses. If your device goes off ONCE and you do not feel well afterward, call 911. If your device goes off TWICE, call 911. If your device goes off THREE times in one day, call 911.  DO NOT DRIVE YOURSELF OR A FAMILY MEMBER WITH A DEFIBRILLATOR TO THE HOSPITAL--CALL 911.

## 2021-06-08 ENCOUNTER — Encounter (HOSPITAL_COMMUNITY): Payer: Self-pay | Admitting: Cardiology

## 2021-06-09 ENCOUNTER — Encounter (HOSPITAL_COMMUNITY): Payer: Self-pay | Admitting: Cardiology

## 2021-06-10 NOTE — Progress Notes (Signed)
Pt has been made aware of normal result and verbalized understanding.  jw

## 2021-06-15 ENCOUNTER — Telehealth: Payer: Self-pay | Admitting: Cardiology

## 2021-06-15 NOTE — Telephone Encounter (Signed)
Pt advised ok to take the Clindamycin with his current medication Mexiletine. Patient verbalized understanding and agreeable to plan.   Pt thinks he may have had an allergic rxn to the hospital tape, but will monitor. Also thinks he may have had a rxn to the pre procedure scrub given to him.  He has a small rash under his arm. Advised to monitor, to discuss at next weeks OV and to contact PCP if worsens. Patient verbalized understanding and agreeable to plan.

## 2021-06-15 NOTE — Telephone Encounter (Signed)
Pt c/o medication issue:  1. Name of Medication: Clindamycin  2. How are you currently taking this medication (dosage and times per day)? NA  3. Are you having a reaction (difficulty breathing--STAT)? NO  4. What is your medication issue? Pt states that he had to go to Urgent Care for a rash. They prescribed him above medication, pt wants to make sure it is ok for him to take.Please advise

## 2021-06-22 ENCOUNTER — Ambulatory Visit (INDEPENDENT_AMBULATORY_CARE_PROVIDER_SITE_OTHER): Payer: Medicaid Other

## 2021-06-22 DIAGNOSIS — I472 Ventricular tachycardia, unspecified: Secondary | ICD-10-CM | POA: Diagnosis not present

## 2021-06-22 LAB — CUP PACEART INCLINIC DEVICE CHECK
Battery Remaining Longevity: 100 mo
Battery Voltage: 3.11 V
Brady Statistic AP VP Percent: 2.28 %
Brady Statistic AP VS Percent: 0.03 %
Brady Statistic AS VP Percent: 95.12 %
Brady Statistic AS VS Percent: 2.57 %
Brady Statistic RA Percent Paced: 2.28 %
Brady Statistic RV Percent Paced: 86.3 %
Date Time Interrogation Session: 20230607191717
HighPow Impedance: 64 Ohm
Implantable Lead Implant Date: 20220204
Implantable Lead Implant Date: 20220204
Implantable Lead Implant Date: 20230523
Implantable Lead Location: 753858
Implantable Lead Location: 753859
Implantable Lead Location: 753860
Implantable Lead Model: 4598
Implantable Lead Model: 5076
Implantable Pulse Generator Implant Date: 20230523
Lead Channel Impedance Value: 202.091
Lead Channel Impedance Value: 202.091
Lead Channel Impedance Value: 205.2 Ohm
Lead Channel Impedance Value: 251.66 Ohm
Lead Channel Impedance Value: 251.66 Ohm
Lead Channel Impedance Value: 304 Ohm
Lead Channel Impedance Value: 342 Ohm
Lead Channel Impedance Value: 380 Ohm
Lead Channel Impedance Value: 380 Ohm
Lead Channel Impedance Value: 494 Ohm
Lead Channel Impedance Value: 494 Ohm
Lead Channel Impedance Value: 513 Ohm
Lead Channel Impedance Value: 627 Ohm
Lead Channel Impedance Value: 627 Ohm
Lead Channel Impedance Value: 760 Ohm
Lead Channel Impedance Value: 779 Ohm
Lead Channel Impedance Value: 912 Ohm
Lead Channel Impedance Value: 950 Ohm
Lead Channel Pacing Threshold Amplitude: 0.5 V
Lead Channel Pacing Threshold Amplitude: 0.5 V
Lead Channel Pacing Threshold Amplitude: 1 V
Lead Channel Pacing Threshold Pulse Width: 0.4 ms
Lead Channel Pacing Threshold Pulse Width: 0.4 ms
Lead Channel Pacing Threshold Pulse Width: 0.8 ms
Lead Channel Sensing Intrinsic Amplitude: 1.375 mV
Lead Channel Sensing Intrinsic Amplitude: 8.125 mV
Lead Channel Setting Pacing Amplitude: 1.5 V
Lead Channel Setting Pacing Amplitude: 2 V
Lead Channel Setting Pacing Amplitude: 2 V
Lead Channel Setting Pacing Pulse Width: 0.4 ms
Lead Channel Setting Pacing Pulse Width: 0.8 ms
Lead Channel Setting Sensing Sensitivity: 0.3 mV

## 2021-06-22 NOTE — Progress Notes (Signed)
Wound check appointment s/p BiV upgrade 06/07/21. Steri-strips removed. Wound without redness or edema. Incision edges approximated, wound well healed. Normal device function. RV, LV Thresholds, sensing, and impedances consistent with implant measurements. RA undersensing at time. Discussed with industry who assisted with interrogation. RA sensitivity decreased to .15 with oversensing farfield noted, falling into atrial blanking. Bi-V pacing is dependent on atrial sensing and thus Bi-V pacing should improve. Device programmed at 3.5V/autocapture in LV for extra safety margin until 3 month visit. Histogram distribution appropriate for patient and level of activity. No mode switches or ventricular arrhythmias noted. Patient educated about wound care, arm mobility, lifting restrictions, shock plan. Patient enrolled in remote monitoring with next transmission scheduled 09/08/21. 91 day FU with Dr. Curt Bears 09/26/21

## 2021-06-22 NOTE — Patient Instructions (Addendum)
   After Your ICD (Implantable Cardiac Defibrillator)    Monitor your defibrillator site for redness, swelling, and drainage. Call the device clinic at [phone] if you experience these symptoms or fever/chills.  Your incision was closed with Steri-strips or staples:  You may shower 7 days after your procedure and wash your incision with soap and water. Avoid lotions, ointments, or perfumes over your incision until it is well-healed.  You may use a hot tub or a pool after your wound check appointment if the incision is completely closed.  Do not lift, push or pull greater than 10 pounds with the affected arm until 6 weeks after your procedure. There are no other restrictions in arm movement after your wound check appointment. July 4  Your ICD is MRI compatible.  Your ICD is designed to protect you from life threatening heart rhythms. Because of this, you may receive a shock.   1 shock with no symptoms:  Call the office during business hours. 1 shock with symptoms (chest pain, chest pressure, dizziness, lightheadedness, shortness of breath, overall feeling unwell):  Call 911. If you experience 2 or more shocks in 24 hours:  Call 911. If you receive a shock, you should not drive.  [place] DMV - no driving for 6 months if you receive appropriate therapy from your ICD.   ICD Alerts:  Some alerts are vibratory and others beep. These are NOT emergencies. Please call our office to let us know. If this occurs at night or on weekends, it can wait until the next business day. Send a remote transmission.  If your device is capable of reading fluid status (for heart failure), you will be offered monthly monitoring to review this with you.   Remote monitoring is used to monitor your ICD from home. This monitoring is scheduled every 91 days by our office. It allows [place] to keep an eye on the functioning of your device to ensure it is working properly. You will routinely see your Electrophysiologist  annually (more often if necessary).

## 2021-07-03 ENCOUNTER — Other Ambulatory Visit: Payer: Self-pay | Admitting: Cardiology

## 2021-07-10 ENCOUNTER — Other Ambulatory Visit (HOSPITAL_COMMUNITY): Payer: Self-pay | Admitting: Internal Medicine

## 2021-07-12 ENCOUNTER — Other Ambulatory Visit: Payer: Self-pay | Admitting: Cardiology

## 2021-07-22 ENCOUNTER — Telehealth: Payer: Self-pay

## 2021-07-22 NOTE — Telephone Encounter (Signed)
The patient states his ICD is beeping every morning around 8:01 am. I had the patient send a manual transmission because we did not receive one since he was implanted. I had Portia, rn to take a look at it.

## 2021-07-22 NOTE — Telephone Encounter (Signed)
Reviewed transmission. Device tones are secondary to unsuccessful [facility] Alert transmission. Did note that FF has resulted in false AF alerts. Will review with industry for advisement and undersensing previously resulted in low BiV pacing percentage. Patient appreciative of follow up.

## 2021-07-26 ENCOUNTER — Ambulatory Visit (INDEPENDENT_AMBULATORY_CARE_PROVIDER_SITE_OTHER): Payer: Medicaid Other

## 2021-07-26 DIAGNOSIS — I472 Ventricular tachycardia, unspecified: Secondary | ICD-10-CM

## 2021-07-26 LAB — CUP PACEART INCLINIC DEVICE CHECK
Date Time Interrogation Session: 20230711142214
Implantable Lead Implant Date: 20220204
Implantable Lead Implant Date: 20220204
Implantable Lead Implant Date: 20230523
Implantable Lead Location: 753858
Implantable Lead Location: 753859
Implantable Lead Location: 753860
Implantable Lead Model: 4598
Implantable Lead Model: 5076
Implantable Pulse Generator Implant Date: 20230523

## 2021-07-26 NOTE — Progress Notes (Signed)
Seen in device clinic to reset alarm tone. Tone was alarming due to monitor searching. Patient advised if any further alarms to please call DC. Pt voiced understanding.

## 2021-08-06 ENCOUNTER — Other Ambulatory Visit (HOSPITAL_COMMUNITY): Payer: Self-pay | Admitting: Internal Medicine

## 2021-08-24 ENCOUNTER — Other Ambulatory Visit (HOSPITAL_COMMUNITY): Payer: Self-pay | Admitting: Internal Medicine

## 2021-09-08 ENCOUNTER — Telehealth (HOSPITAL_COMMUNITY): Payer: Self-pay | Admitting: *Deleted

## 2021-09-08 ENCOUNTER — Ambulatory Visit (INDEPENDENT_AMBULATORY_CARE_PROVIDER_SITE_OTHER): Payer: Medicaid Other

## 2021-09-08 DIAGNOSIS — I442 Atrioventricular block, complete: Secondary | ICD-10-CM | POA: Diagnosis not present

## 2021-09-08 LAB — CUP PACEART REMOTE DEVICE CHECK
Battery Remaining Longevity: 94 mo
Battery Voltage: 3.05 V
Brady Statistic AP VP Percent: 5.36 %
Brady Statistic AP VS Percent: 0.03 %
Brady Statistic AS VP Percent: 91.06 %
Brady Statistic AS VS Percent: 3.55 %
Brady Statistic RA Percent Paced: 4.94 %
Brady Statistic RV Percent Paced: 94.14 %
Date Time Interrogation Session: 20230824022604
HighPow Impedance: 77 Ohm
Implantable Lead Implant Date: 20220204
Implantable Lead Implant Date: 20220204
Implantable Lead Implant Date: 20230523
Implantable Lead Location: 753858
Implantable Lead Location: 753859
Implantable Lead Location: 753860
Implantable Lead Model: 4598
Implantable Lead Model: 5076
Implantable Pulse Generator Implant Date: 20230523
Lead Channel Impedance Value: 247 Ohm
Lead Channel Impedance Value: 264.643
Lead Channel Impedance Value: 264.643
Lead Channel Impedance Value: 276.305
Lead Channel Impedance Value: 276.305
Lead Channel Impedance Value: 323 Ohm
Lead Channel Impedance Value: 342 Ohm
Lead Channel Impedance Value: 380 Ohm
Lead Channel Impedance Value: 494 Ohm
Lead Channel Impedance Value: 494 Ohm
Lead Channel Impedance Value: 570 Ohm
Lead Channel Impedance Value: 627 Ohm
Lead Channel Impedance Value: 817 Ohm
Lead Channel Impedance Value: 855 Ohm
Lead Channel Impedance Value: 855 Ohm
Lead Channel Impedance Value: 893 Ohm
Lead Channel Impedance Value: 912 Ohm
Lead Channel Impedance Value: 912 Ohm
Lead Channel Pacing Threshold Amplitude: 0.5 V
Lead Channel Pacing Threshold Amplitude: 0.75 V
Lead Channel Pacing Threshold Amplitude: 0.875 V
Lead Channel Pacing Threshold Pulse Width: 0.4 ms
Lead Channel Pacing Threshold Pulse Width: 0.4 ms
Lead Channel Pacing Threshold Pulse Width: 0.8 ms
Lead Channel Sensing Intrinsic Amplitude: 1.5 mV
Lead Channel Sensing Intrinsic Amplitude: 1.5 mV
Lead Channel Sensing Intrinsic Amplitude: 8.5 mV
Lead Channel Sensing Intrinsic Amplitude: 8.5 mV
Lead Channel Setting Pacing Amplitude: 1.5 V
Lead Channel Setting Pacing Amplitude: 2 V
Lead Channel Setting Pacing Amplitude: 2 V
Lead Channel Setting Pacing Pulse Width: 0.4 ms
Lead Channel Setting Pacing Pulse Width: 0.8 ms
Lead Channel Setting Sensing Sensitivity: 0.3 mV

## 2021-09-09 ENCOUNTER — Ambulatory Visit (INDEPENDENT_AMBULATORY_CARE_PROVIDER_SITE_OTHER): Payer: Medicaid Other | Admitting: Cardiology

## 2021-09-09 ENCOUNTER — Encounter: Payer: Self-pay | Admitting: Cardiology

## 2021-09-09 VITALS — BP 114/62 | HR 58 | Ht 67.0 in | Wt 183.0 lb

## 2021-09-09 DIAGNOSIS — I5022 Chronic systolic (congestive) heart failure: Secondary | ICD-10-CM

## 2021-09-09 DIAGNOSIS — I442 Atrioventricular block, complete: Secondary | ICD-10-CM

## 2021-09-09 NOTE — Progress Notes (Signed)
Electrophysiology Office Note   Date:  09/09/2021   ID:  Charles Holmes, DOB 1966/01/07, MRN [id number]  PCP:  Charles Peak, PA-C  Cardiologist: Charles Holmes Primary Electrophysiologist:  Charles Nguyen Jorja Loa, MD    Chief Complaint: ICD   History of Present Illness: Charles Holmes is a 56 y.o. male who is being seen today for the evaluation of ICD at the request of Charles Holmes, [place]. Presenting today for electrophysiology evaluation.  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  He had an echo at [facility] with an ejection fraction of 25 to 30%.  Cardiac MRI showed ejection fraction of 18%.  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  He had a Medtronic ICD implanted 02/19/2018.  As his ejection fraction went down he is now status post CRT-D upgrade 06/08/2021.  Today, denies symptoms of palpitations, chest pain, shortness of breath, orthopnea, PND, lower extremity edema, claudication, dizziness, presyncope, syncope, bleeding, or neurologic sequela. The patient is tolerating medications without difficulties.  Overall he has been doing well.  He remains on methotrexate.  He feels somewhat tired and fatigued, but overall has been doing well.  He is waiting to change insurance, and is hoping to be able to get a PET scan once insurance has adjusted.   Past Medical History:  Diagnosis Date   CAD (coronary artery disease)    CHF (congestive heart failure) (HCC)    GERD (gastroesophageal reflux disease)    Hyperlipidemia    Hypertension    Systolic heart failure (HCC) 2021   LVEF 18%, RVEF 38% on cardiac MRI 12/19/2019. possible cardiac sarcoidosis.   Wide-complex tachycardia 2021   wears LifeVest   Past Surgical History:  Procedure Laterality Date   BIV UPGRADE N/A 06/07/2021   Procedure: BIV ICD UPGRADE;  Surgeon: Regan Lemming, MD;  Location: [facility] INVASIVE CV LAB;  Service: Cardiovascular;   Laterality: N/A;   ICD IMPLANT N/A 02/20/2020   Procedure: ICD IMPLANT;  Surgeon: Regan Lemming, MD;  Location: [facility] INVASIVE CV LAB;  Service: Cardiovascular;  Laterality: N/A;   RIGHT/LEFT HEART CATH AND CORONARY ANGIOGRAPHY N/A 12/16/2019   Procedure: RIGHT/LEFT HEART CATH AND CORONARY ANGIOGRAPHY;  Surgeon: Swaziland, Peter M, MD;  Location: [facility] INVASIVE CV LAB;  Service: Cardiovascular;  Laterality: N/A;     Current Outpatient Medications  Medication Sig Dispense Refill   aspirin EC 81 MG EC tablet Take 1 tablet (81 mg total) by mouth daily. 90 tablet 3   atorvastatin (LIPITOR) 80 MG tablet TAKE 1 TABLET BY MOUTH EVERY DAY 90 tablet 2   carvedilol (COREG) 3.125 MG tablet TAKE 1 TABLET BY MOUTH TWICE A DAY 180 tablet 3   dorzolamide-timolol (COSOPT) 22.3-6.8 MG/ML ophthalmic solution Place 1 drop into both eyes in the morning and at bedtime.     fluticasone-salmeterol (ADVAIR HFA) 230-21 MCG/ACT inhaler Inhale 2 puffs into the lungs 2 (two) times daily. 1 each 12   folic acid (FOLVITE) 1 MG tablet TAKE 1 TABLET BY MOUTH EVERY DAY 90 tablet 2   furosemide (LASIX) 40 MG tablet Take 40 mg by mouth daily as needed for fluid.     latanoprost (XALATAN) 0.005 % ophthalmic solution Place 1 drop into both eyes at bedtime.     losartan (COZAAR) 25 MG tablet TAKE 1 TABLET (25 MG) BY MOUTH IN THE MORNING AND AT BEDTIME 180 tablet 1   methotrexate (RHEUMATREX) 2.5 MG tablet Take 8 tablets (  20 mg total) by mouth every Wednesday. 96 tablet 3   mexiletine (MEXITIL) 250 MG capsule TAKE 1 CAPSULE BY MOUTH TWICE A DAY 180 capsule 1   Multiple Vitamins-Minerals (MULTIVITAMIN WITH MINERALS) tablet Take 1 tablet by mouth daily.     pantoprazole (PROTONIX) 40 MG tablet Take 40 mg by mouth daily before breakfast.     spironolactone (ALDACTONE) 25 MG tablet TAKE 1/2 TABLET BY MOUTH EVERY DAY 45 tablet 6   No current facility-administered medications for this visit.    Allergies:   Percocet  [oxycodone-acetaminophen]   Social History:  The patient  reports that he has quit smoking. His smoking use included cigarettes. He has never used smokeless tobacco. He reports current alcohol use of about 6.0 standard drinks of alcohol per week. He reports current drug use. Drug: Marijuana.   Family History:  The patient's family history includes Heart disease in his father and mother; Hypertension in his father.   ROS:  Please see the history of present illness.   Otherwise, review of systems is positive for none.   All other systems are reviewed and negative.   PHYSICAL EXAM: VS:  BP 114/62   Pulse (!) 58   Ht 5\' 7"  (1.702 m)   Wt 183 lb (83 kg)   SpO2 94%   BMI 28.66 kg/m  , BMI Body mass index is 28.66 kg/m. GEN: Well nourished, well developed, in no acute distress  HEENT: normal  Neck: no JVD, carotid bruits, or masses Cardiac: RRR; no murmurs, rubs, or gallops,no edema  Respiratory:  clear to auscultation bilaterally, normal work of breathing GI: soft, nontender, nondistended, + BS MS: no deformity or atrophy  Skin: warm and dry, device site well healed Neuro:  Strength and sensation are intact Psych: euthymic mood, full affect  EKG:  EKG is ordered today. Personal review of the ekg ordered shows sinus rhythm, ventricular paced, rate 63  Personal review of the device interrogation today. Results in Paceart   Recent Labs: 04/15/2021: ALT 53; B Natriuretic Peptide 285.5 05/31/2021: BUN 12; Creatinine, Ser 1.08; Hemoglobin 14.3; Platelets 203; Potassium 4.9; Sodium 143    Lipid Panel     Component Value Date/Time   CHOL 237 (H) 12/18/2019 0147   TRIG 119 12/18/2019 0147   HDL 35 (L) 12/18/2019 0147   CHOLHDL 6.8 12/18/2019 0147   VLDL 24 12/18/2019 0147   LDLCALC 178 (H) 12/18/2019 0147     Wt Readings from Last 3 Encounters:  09/09/21 183 lb (83 kg)  06/07/21 175 lb (79.4 kg)  04/15/21 178 lb 9.6 oz (81 kg)      Other studies Reviewed: Additional studies/  records that were reviewed today include: Right, left heart catheterization 11/15/2019 Review of the above records today demonstrates:  1. Single vessel borderline obstructive disease involving the proximal LAD.  2. Moderately elevated LV filling pressures 3. Moderate pulmonary HTN 4. Preserved cardiac output with index 2.41.   Cardiac MRI 12/20/2019 1. There is subendocardial late gadolinium enhancement suggestive of prior infarct, however does not occur in a coronary distribution, as there is circumferential subendocardial LGE at the base along with the mid inferoseptum/anteroseptum/anterior walls and apical septum. In addition there is nonischemic scar pattern with subepicardial LGE in the mid anterior and mid inferior walls. There is also LGE of the papillary muscles. 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 In addition, there are elevated T2 values in the basal to apical septum suggesting myocardial edema. Recommend cardiac  PET scan for further evaluation of sarcoidosis. Would also recommend chest CT to evaluate for evidence of pulmonary sarcoid.   2. Moderate LV dilation with severe systolic dysfunction (EF 18%). Diffuse hypokinesis with thinning/akinesis of basal inferoseptum, basal to mid anterior, mid anteroseptum, mid inferolateral, and apical septum   3.  Normal RV size with moderate systolic dysfunction (EF 38%)   ASSESSMENT AND PLAN:  1.  Ventricular tachycardia: Found in the setting of cardiac sarcoidosis.  Currently on prednisone and Bactrim for PJP prophylaxis.  Amiodarone was stopped and he has been started on mexiletine to 50 mg twice daily.  High risk medication monitoring.  No further ventricular arrhythmias.  2.  Chronic systolic heart failure: Due to sarcoidosis.  On methotrexate.  PET scan February 2022 showed active sarcoidosis.  Currently on optimal medical therapy for heart failure with losartan 25 mg daily, carvedilol 3.125 mg  twice daily.  Is now status post Medtronic CRT-D upgrade 06/08/2021.  Device function appropriately.  No changes at this time.  3.  Coronary artery disease: 70% LAD lesion.  No intervention planned.  No current chest pain.  4.  Complete heart block: Status post CRT-D upgrade.  Device function appropriately.    5.  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   The patient does not have concerns regarding his medicines.  The following changes were made today: none  Labs/ tests ordered today include:  Orders Placed This Encounter  Procedures   EKG 12-Lead     Disposition:   FU 12 months  Signed, Charles Holmes Jorja Loa, MD  09/09/2021 4:53 PM     [facility] [address] [place] [address] [phone] (office) [phone] (fax)

## 2021-09-09 NOTE — Patient Instructions (Signed)
Medication Instructions:  Your physician recommends that you continue on your current medications as directed. Please refer to the Current Medication list given to you today.  *If you need a refill on your cardiac medications before your next appointment, please call your pharmacy*   Lab Work: None ordered If you have labs (blood work) drawn today and your tests are completely normal, you will receive your results only by: MyChart Message (if you have MyChart) OR A paper copy in the mail If you have any lab test that is abnormal or we need to change your treatment, we will call you to review the results.   Testing/Procedures: None ordered   Follow-Up: At [facility], you and your health needs are our priority.  As part of our continuing mission to provide you with exceptional heart care, we have created designated Provider Care Teams.  These Care Teams include your primary Cardiologist (physician) and Advanced Practice Providers (APPs -  Physician Assistants and Nurse Practitioners) who all work together to provide you with the care you need, when you need it.   Remote monitoring is used to monitor your Pacemaker or ICD from home. This monitoring reduces the number of office visits required to check your device to one time per year. It allows [place] to keep an eye on the functioning of your device to ensure it is working properly. You are scheduled for a device check from home on 12/09/2021. You may send your transmission at any time that day. If you have a wireless device, the transmission will be sent automatically. After your physician reviews your transmission, you will receive a postcard with your next transmission date.  Your next appointment:   1 year(s)  The format for your next appointment:   In Person  Provider:   Loman Brooklyn, MD    Thank you for choosing [facility]!!   Dory Horn, RN [phone]    Other Instructions   Important Information About  [place]

## 2021-09-13 LAB — CUP PACEART INCLINIC DEVICE CHECK
Battery Remaining Longevity: 96 mo
Battery Voltage: 3.05 V
Brady Statistic AP VP Percent: 5.57 %
Brady Statistic AP VS Percent: 0.03 %
Brady Statistic AS VP Percent: 90.91 %
Brady Statistic AS VS Percent: 3.49 %
Brady Statistic RA Percent Paced: 5.13 %
Brady Statistic RV Percent Paced: 94.21 %
Date Time Interrogation Session: 20230825162900
HighPow Impedance: 79 Ohm
Implantable Lead Implant Date: 20220204
Implantable Lead Implant Date: 20220204
Implantable Lead Implant Date: 20230523
Implantable Lead Location: 753858
Implantable Lead Location: 753859
Implantable Lead Location: 753860
Implantable Lead Model: 4598
Implantable Lead Model: 5076
Implantable Pulse Generator Implant Date: 20230523
Lead Channel Impedance Value: 251.66 Ohm
Lead Channel Impedance Value: 264.643
Lead Channel Impedance Value: 264.643
Lead Channel Impedance Value: 270 Ohm
Lead Channel Impedance Value: 270 Ohm
Lead Channel Impedance Value: 323 Ohm
Lead Channel Impedance Value: 399 Ohm
Lead Channel Impedance Value: 399 Ohm
Lead Channel Impedance Value: 494 Ohm
Lead Channel Impedance Value: 513 Ohm
Lead Channel Impedance Value: 570 Ohm
Lead Channel Impedance Value: 570 Ohm
Lead Channel Impedance Value: 779 Ohm
Lead Channel Impedance Value: 779 Ohm
Lead Channel Impedance Value: 855 Ohm
Lead Channel Impedance Value: 893 Ohm
Lead Channel Impedance Value: 950 Ohm
Lead Channel Impedance Value: 950 Ohm
Lead Channel Pacing Threshold Amplitude: 0.5 V
Lead Channel Pacing Threshold Amplitude: 0.75 V
Lead Channel Pacing Threshold Amplitude: 0.875 V
Lead Channel Pacing Threshold Pulse Width: 0.4 ms
Lead Channel Pacing Threshold Pulse Width: 0.4 ms
Lead Channel Pacing Threshold Pulse Width: 0.8 ms
Lead Channel Sensing Intrinsic Amplitude: 0.5 mV
Lead Channel Sensing Intrinsic Amplitude: 1.625 mV
Lead Channel Sensing Intrinsic Amplitude: 8.5 mV
Lead Channel Sensing Intrinsic Amplitude: 9.5 mV
Lead Channel Setting Pacing Amplitude: 1.5 V
Lead Channel Setting Pacing Amplitude: 2 V
Lead Channel Setting Pacing Amplitude: 2 V
Lead Channel Setting Pacing Pulse Width: 0.4 ms
Lead Channel Setting Pacing Pulse Width: 0.8 ms
Lead Channel Setting Sensing Sensitivity: 0.3 mV

## 2021-09-26 ENCOUNTER — Encounter: Payer: Medicaid Other | Admitting: Cardiology

## 2021-10-03 ENCOUNTER — Other Ambulatory Visit (HOSPITAL_COMMUNITY): Payer: Self-pay

## 2021-10-03 DIAGNOSIS — I5022 Chronic systolic (congestive) heart failure: Secondary | ICD-10-CM

## 2021-10-04 NOTE — Progress Notes (Signed)
Remote ICD transmission.   

## 2021-10-10 ENCOUNTER — Encounter (HOSPITAL_COMMUNITY): Payer: Self-pay | Admitting: *Deleted

## 2021-10-10 NOTE — Progress Notes (Signed)
Disability claim form completed, signed by Dr Haroldine Laws and faxed to [place]

## 2021-11-07 ENCOUNTER — Ambulatory Visit (HOSPITAL_BASED_OUTPATIENT_CLINIC_OR_DEPARTMENT_OTHER)
Admission: RE | Admit: 2021-11-07 | Discharge: 2021-11-07 | Disposition: A | Discharge status: Home / Self Care | Payer: Commercial Managed Care - HMO | Source: Ambulatory Visit | Attending: Internal Medicine | Admitting: Internal Medicine

## 2021-11-07 ENCOUNTER — Ambulatory Visit (HOSPITAL_COMMUNITY)
Admission: RE | Admit: 2021-11-07 | Discharge: 2021-11-07 | Disposition: A | Discharge status: Home / Self Care | Payer: Commercial Managed Care - HMO | Source: Ambulatory Visit | Attending: Physician Assistant | Admitting: Physician Assistant

## 2021-11-07 ENCOUNTER — Other Ambulatory Visit (HOSPITAL_COMMUNITY): Payer: Self-pay

## 2021-11-07 ENCOUNTER — Other Ambulatory Visit: Payer: Self-pay

## 2021-11-07 VITALS — BP 130/70 | HR 70 | Wt 183.8 lb

## 2021-11-07 DIAGNOSIS — Z79899 Other long term (current) drug therapy: Secondary | ICD-10-CM | POA: Insufficient documentation

## 2021-11-07 DIAGNOSIS — R079 Chest pain, unspecified: Secondary | ICD-10-CM | POA: Insufficient documentation

## 2021-11-07 DIAGNOSIS — D8685 Sarcoid myocarditis: Secondary | ICD-10-CM | POA: Diagnosis not present

## 2021-11-07 DIAGNOSIS — I5022 Chronic systolic (congestive) heart failure: Secondary | ICD-10-CM | POA: Diagnosis not present

## 2021-11-07 DIAGNOSIS — G4719 Other hypersomnia: Secondary | ICD-10-CM | POA: Diagnosis not present

## 2021-11-07 DIAGNOSIS — R251 Tremor, unspecified: Secondary | ICD-10-CM | POA: Insufficient documentation

## 2021-11-07 DIAGNOSIS — Z7982 Long term (current) use of aspirin: Secondary | ICD-10-CM | POA: Insufficient documentation

## 2021-11-07 DIAGNOSIS — I472 Ventricular tachycardia, unspecified: Secondary | ICD-10-CM

## 2021-11-07 DIAGNOSIS — I428 Other cardiomyopathies: Secondary | ICD-10-CM | POA: Diagnosis not present

## 2021-11-07 DIAGNOSIS — I110 Hypertensive heart disease with heart failure: Secondary | ICD-10-CM | POA: Diagnosis present

## 2021-11-07 DIAGNOSIS — K219 Gastro-esophageal reflux disease without esophagitis: Secondary | ICD-10-CM | POA: Insufficient documentation

## 2021-11-07 DIAGNOSIS — Z8249 Family history of ischemic heart disease and other diseases of the circulatory system: Secondary | ICD-10-CM | POA: Diagnosis not present

## 2021-11-07 DIAGNOSIS — I2510 Atherosclerotic heart disease of native coronary artery without angina pectoris: Secondary | ICD-10-CM | POA: Insufficient documentation

## 2021-11-07 DIAGNOSIS — Z4502 Encounter for adjustment and management of automatic implantable cardiac defibrillator: Secondary | ICD-10-CM | POA: Diagnosis not present

## 2021-11-07 LAB — COMPREHENSIVE METABOLIC PANEL
ALT: 37 U/L (ref 0–44)
AST: 35 U/L (ref 15–41)
Albumin: 4 g/dL (ref 3.5–5.0)
Alkaline Phosphatase: 64 U/L (ref 38–126)
Anion gap: 6 (ref 5–15)
BUN: 14 mg/dL (ref 6–20)
CO2: 30 mmol/L (ref 22–32)
Calcium: 9 mg/dL (ref 8.9–10.3)
Chloride: 102 mmol/L (ref 98–111)
Creatinine, Ser: 1.06 mg/dL (ref 0.61–1.24)
GFR, Estimated: >60 mL/min (ref >60)
Glucose, Bld: 72 mg/dL (ref 70–99)
Potassium: 4.3 mmol/L (ref 3.5–5.1)
Sodium: 138 mmol/L (ref 135–145)
Total Bilirubin: 1.9 mg/dL — ABNORMAL HIGH (ref 0.3–1.2)
Total Protein: 6.5 g/dL (ref 6.5–8.1)

## 2021-11-07 LAB — ECHOCARDIOGRAM COMPLETE
AR max vel: 1.68 cm2
AV Area VTI: 1.63 cm2
AV Area mean vel: 1.61 cm2
AV Mean grad: 3 mmHg
AV Peak grad: 4.9 mmHg
Ao pk vel: 1.11 m/s
Area-P 1/2: 4.17 cm2
Calc EF: 18.8 %
S' Lateral: 6.6 cm
Single Plane A2C EF: 9.1 %
Single Plane A4C EF: 21.8 %

## 2021-11-07 LAB — BRAIN NATRIURETIC PEPTIDE: B Natriuretic Peptide: 284 pg/mL — ABNORMAL HIGH (ref 0.0–100.0)

## 2021-11-07 MED ORDER — MEXILETINE HCL 250 MG PO CAPS: 250.0000 mg | ORAL_CAPSULE | Freq: Two times a day (BID) | ORAL | 3 refills | Status: DC

## 2021-11-07 MED ORDER — DAPAGLIFLOZIN PROPANEDIOL 10 MG PO TABS: 10.0000 mg | ORAL_TABLET | Freq: Every day | ORAL | 11 refills | Status: DC

## 2021-11-07 NOTE — Progress Notes (Signed)
ADVANCED HF CLINIC NOTE  IV:[id number], Ovid Curd, [place] Primary Cardiologist: Shirlee More, MD  HPI:  Mr Bordonaro is a 56 y/o with HTN, GERD, systolic HF due to NICM, VT in setting of cardiac sarcoidosis   Admitted to [facility] 11/21 with CP. Found to have VT and EF 25-30% mild AI, MR and TR. Transferred to [facility]. Cath showed 70% prox LAD with no intervention.  He was discharged with Life Vest.   cMRI 12/19/19 EF 18% diffuse LGE suggestive of sarcoid. RV 38%  Presented to HF Clinic 02/03/19 with recurrent VT. Amio started. Referred to [facility] for PET.    Re-admitted  02/2020 with recurrent VT and LifeVest alarms. + AKI. Started prednisone and PJP prophylaxis. ICD implanted.    PET @ [facility] 2/22. EF 25% with active inflammation c/w sarcoid.  Amio switched due to mexilitene due to severe tremor  CPX 8/22 FVC 1.37 (30%)      FEV1 0.65 (18%)        FEV1/FVC 48 (60%)        MVV 23 (16%)      (Unable to perform PFTs due to poor effort and hypoventilation)  Resting HR: 71 Standing HR: 71 Peak HR: 130   (80% age predicted max HR)  BP rest: 98/66 Standing BP: 100/64 BP peak: 112/58  Peak VO2: 16.1 (49% predicted peak VO2)  VE/VCO2 slope:  19  OUES: 1.82  Peak RER: 1.17  PETCO2 at peak:  59   Here for routine f/u. S/p CRT-D upgrade 06/08/21. Now of prednisone. On MTX 20mg  every week.  Feeling better. SOB only with exertion. Uses lasix maybe 1-2x/week. Denies CP and SOB at rest.   Echo today 11/07/21: EF 20-25% RV mild to moderately reduced.  Echo 08/26/20 EF < 20% severely dilated LV RV mildly decreased.   ICD interrogated personally: No VT/VF, total VP 98%, activity 2hrs/day  ROS: All systems negative except as listed in HPI, PMH and Problem List.  SH:  Social History   Socioeconomic History   Marital status: Divorced    Spouse name: Not on file   Number of children: Not on file   Years of education: Not on file   Highest education level: Not on file  Occupational History   Not on file   Tobacco Use   Smoking status: Former    Types: Cigarettes   Smokeless tobacco: Never  Vaping Use   Vaping Use: Unknown  Substance and Sexual Activity   Alcohol use: Yes    Alcohol/week: 6.0 standard drinks of alcohol    Types: 6 Cans of beer per week   Drug use: Yes    Types: Marijuana    Comment: stopped using months ago d/t how it affected breathing   Sexual activity: Yes  Other Topics Concern   Not on file  Social History Narrative   Not on file   Social Determinants of Health   Financial Resource Strain: Not on file  Food Insecurity: Not on file  Transportation Needs: Not on file  Physical Activity: Not on file  Stress: Not on file  Social Connections: Not on file  Intimate Partner Violence: Not on file    FH:  Family History  Problem Relation Age of Onset   Hypertension Father    Heart disease Father    Heart disease Mother     Past Medical History:  Diagnosis Date   CAD (coronary artery disease)    CHF (congestive heart failure) (HCC)    GERD (gastroesophageal  reflux disease)    Hyperlipidemia    Hypertension    Systolic heart failure ([place]) 2021   LVEF 18%, RVEF 38% on cardiac MRI 12/19/2019. possible cardiac sarcoidosis.   Wide-complex tachycardia 2021   wears LifeVest    Current Outpatient Medications  Medication Sig Dispense Refill   aspirin EC 81 MG EC tablet Take 1 tablet (81 mg total) by mouth daily. 90 tablet 3   atorvastatin (LIPITOR) 80 MG tablet TAKE 1 TABLET BY MOUTH EVERY DAY 90 tablet 2   carvedilol (COREG) 3.125 MG tablet TAKE 1 TABLET BY MOUTH TWICE A DAY 180 tablet 3   dorzolamide-timolol (COSOPT) 22.3-6.8 MG/ML ophthalmic solution Place 1 drop into both eyes in the morning and at bedtime.     fluticasone-salmeterol (ADVAIR HFA) 230-21 MCG/ACT inhaler Inhale 2 puffs into the lungs 2 (two) times daily. 1 each 12   folic acid (FOLVITE) 1 MG tablet TAKE 1 TABLET BY MOUTH EVERY DAY 90 tablet 2   furosemide (LASIX) 40 MG tablet Take 40 mg  by mouth daily as needed for fluid.     latanoprost (XALATAN) 0.005 % ophthalmic solution Place 1 drop into both eyes at bedtime.     losartan (COZAAR) 25 MG tablet TAKE 1 TABLET (25 MG) BY MOUTH IN THE MORNING AND AT BEDTIME 180 tablet 1   methotrexate (RHEUMATREX) 2.5 MG tablet Take 8 tablets (20 mg total) by mouth every Wednesday. 96 tablet 3   mexiletine (MEXITIL) 250 MG capsule Take 1 capsule (250 mg total) by mouth 2 (two) times daily. 180 capsule 3   Multiple Vitamins-Minerals (MULTIVITAMIN WITH MINERALS) tablet Take 1 tablet by mouth daily.     pantoprazole (PROTONIX) 40 MG tablet Take 40 mg by mouth daily before breakfast.     spironolactone (ALDACTONE) 25 MG tablet TAKE 1/2 TABLET BY MOUTH EVERY DAY 45 tablet 6   No current facility-administered medications for this visit.    There were no vitals filed for this visit.    PHYSICAL EXAM: General:  well appearing.  No respiratory difficulty HEENT: normal Neck: supple. No JVD. Carotids 2+ bilat; no bruits. No lymphadenopathy or thyromegaly appreciated. Cor: PMI nondisplaced. Regular rate & rhythm. No rubs, gallops or murmurs. Lungs: clear Abdomen: soft, nontender, nondistended. No hepatosplenomegaly. No bruits or masses. Good bowel sounds. Extremities: no cyanosis, clubbing, rash, edema  Neuro: alert & oriented x 3, cranial nerves grossly intact. moves all 4 extremities w/o difficulty. Affect pleasant.   ECG: A-sensed vpaced 61 (Personally reviewed)    Device interrogation: No VT/VF, total VP 98%, activity 2hrs/day  ASSESSMENT & PLAN: 1. VT - ln setting of sarcoid heart disease  - Off amio due to tremor. Continue mexilitene  - now s/p ICD. VT quiescent - Keep K > 4.0 Mg > 2.0 - Treatment of sarcoid as below   2. Chronic Systolic HF - Diagnosed 99991111. Presented with VT. LHC 70% LAD  - Echo EF 25-30%  - cMRI 12/21 concerning for sarcoid and EF 18%.  - PET 2/22 at [facility] EF 25% + active sarcoid - Echo 08/26/20 EF < 20%  severely dilated LV RV mildly decreased.  - CPX 8/22. Unable to complete PFTs due to hypoventilation and poor effort. pVO2 16.1 (49% predicted). Slope 19 RER: 1.17 PETCO2 59 - Echo today 11/07/21: EF 20-25% RV mild to moderately reduced.  - NYHA II, volume status looks good. Uses lasix as needed - Continue losartan 25 bid - Continue spiro 12.5 - Carvedilol 3.125 bid - Start  Farxiga 10mg  daily - CRT-D upgrade in 06/08/21  - labs today  3. CAD - LHC 12/07/19 70-% LAD, no intervention - No s/s ischemia, denies CP - Continue statin and aspirin.   4. Cardiac sarcoid - PET 2/22 at [facility] EF 25% + active sarcoid - D/w Dr. Weyman Croon at [facility].  - Has completed prednisone. Now on MTX 20 weekly. Continue folic acid - We discussed EM biopsy but patient is reluctant  - Has new insurance, Will order repeat PET scan   Earnie Larsson AGACNP-BC  1:43 PM  Patient seen and examined with the above-signed Advanced Practice Provider and/or Housestaff. I personally reviewed laboratory data, imaging studies and relevant notes. I independently examined the patient and formulated the important aspects of the plan. I have edited the note to reflect any of my changes or salient points. I have personally discussed the plan with the patient and/or family.  Overall improved NYHA II but EF still markedly depressed at 20-25% on echo today despite CRT upgrade. Volume status ok. On MTX 20mg /week. No VT.   ICD interrogation: No VT/AF Volume ok   General:  Well appearing. No resp difficulty HEENT: normal Neck: supple. no JVD. Carotids 2+ bilat; no bruits. No lymphadenopathy or thryomegaly appreciated. Cor: PMI nondisplaced. Regular rate & rhythm. No rubs, gallops or murmurs. Lungs: clear Abdomen: soft, nontender, nondistended. No hepatosplenomegaly. No bruits or masses. Good bowel sounds. Extremities: no cyanosis, clubbing, rash, edema Neuro: alert & orientedx3, cranial nerves grossly intact. moves all 4 extremities w/o  difficulty. Affect pleasant  Clinically improved but EF still suppressed. Will need repeat PET scan at [facility] to make sure sarcoid is quiescent off prednisone. If not, may need to consdier alternate agent (I.e. Humira). Will add SGLT2i. Check sleep study.   Glori Bickers, MD  5:24 PM

## 2021-11-07 NOTE — Progress Notes (Signed)
Height: 5'7"    Weight: 183 lb BMI: 28.79  Today's Date: 11/07/21  STOP BANG RISK ASSESSMENT S (snore) Have you been told that you snore?     YES   T (tired) Are you often tired, fatigued, or sleepy during the day?   YES  O (obstruction) Do you stop breathing, choke, or gasp during sleep? NO   P (pressure) Do you have or are you being treated for high blood pressure? YES   B (BMI) Is your body index greater than 35 kg/m? NO   A (age) Are you 58 years old or older? YES   N (neck) Do you have a neck circumference greater than 16 inches?      G (gender) Are you a male? YES   TOTAL STOP/BANG "YES" ANSWERS 5                                                                       For Office Use Only              Procedure Order Form    YES to 3+ Stop Bang questions OR two clinical symptoms - patient qualifies for WatchPAT (CPT 95800)      Clinical Notes: Will consult Sleep Specialist and refer for management of therapy due to patient increased risk of Sleep Apnea. Ordering a sleep study due to the following two clinical symptoms: Excessive daytime sleepiness G47.10 /  Loud snoring R06.83

## 2021-11-07 NOTE — Progress Notes (Signed)
  Echocardiogram 2D Echocardiogram has been performed.  Charles Holmes 11/07/2021, 1:24 PM

## 2021-11-07 NOTE — Patient Instructions (Signed)
Medication Changes:  START Farxiga 10 mg Daily  Lab Work:  Labs done today, your results will be available in MyChart, we will contact you for abnormal readings.  Testing/Procedures:  Your provider has recommended that you have a home sleep study.  We have provided you with the equipment in our office today. Please download the app and follow the instructions. YOUR PIN NUMBER IS: [id number]. Once you have completed the test you just dispose of the equipment, the information is automatically uploaded to [place] via blue-tooth technology. If your test is positive for sleep apnea and you need a home CPAP machine you will be contacted by Dr Theodosia Blender office [facility]) to set this up.  Your provider has recommended you have a Cardiac PET Scan at [facility]. We will get this approved with your insurance company and get it scheduled for you. We will call you with the date and time and instructions. [facility] will call you to review this information the day before the test.   Referrals:  none  Special Instructions // Education:  Do the following things EVERYDAY: Weigh yourself in the morning before breakfast. Write it down and keep it in a log. Take your medicines as prescribed Eat low salt foods--Limit salt (sodium) to 2000 mg per day.  Stay as active as you can everyday Limit all fluids for the day to less than 2 liters   Follow-Up in: 4 months, **PLEASE CALL OUR OFFICE IN DECEMBER TO SCHEDULE THIS APPOINTMENT  At the [facility], you and your health needs are our priority. We have a designated team specialized in the treatment of Heart Failure. This Care Team includes your primary Heart Failure Specialized Cardiologist (physician), Advanced Practice Providers (APPs- Physician Assistants and Nurse Practitioners), and Pharmacist who all work together to provide you with the care you need, when you need it.   You may see any of the following providers on your designated Care Team at your  next follow up:  Dr. Glori Bickers Dr. Loralie Champagne Dr. Roxana Hires, NP Lyda Jester, [place] [facility] [place], [place] Forestine Na, NP Audry Riles, PharmD   Please be sure to bring in all your medications bottles to every appointment.   Need to Contact us:  If you have any questions or concerns before your next appointment please send [place] a message through [place] or call our office at [phone].    TO LEAVE A MESSAGE FOR THE NURSE SELECT OPTION 2, PLEASE LEAVE A MESSAGE INCLUDING: YOUR NAME DATE OF BIRTH CALL BACK NUMBER REASON FOR CALL**this is important as we prioritize the call backs  YOU WILL RECEIVE A CALL BACK THE SAME DAY AS LONG AS YOU CALL BEFORE 4:00 PM

## 2021-11-15 ENCOUNTER — Telehealth (HOSPITAL_COMMUNITY): Payer: Self-pay | Admitting: Surgery

## 2021-11-15 NOTE — Telephone Encounter (Signed)
-----   Message from Harvie Junior, [place] sent at 11/15/2021  4:01 PM EDT ----- Regarding: RE: home sleep study Medicaid dies not cover itamar ----- Message ----- From: Micki Riley, RN Sent: 11/15/2021  11:45 AM EDT To: Harvie Junior, CMA Subject: home sleep study                               Precert?

## 2021-11-15 NOTE — Telephone Encounter (Signed)
I called to inform patient that per clinic CMA- patient's insurance does not cover home sleep studies.  He will return the study when he comes back to clinic. I will forward to physician to consider in clinic sleep study.

## 2021-11-23 ENCOUNTER — Telehealth (HOSPITAL_COMMUNITY): Payer: Self-pay | Admitting: Surgery

## 2021-11-23 DIAGNOSIS — G4719 Other hypersomnia: Secondary | ICD-10-CM

## 2021-11-23 NOTE — Telephone Encounter (Signed)
Order placed for in clinic sleep study as per physician.

## 2021-12-11 ENCOUNTER — Other Ambulatory Visit (HOSPITAL_COMMUNITY): Payer: Self-pay | Admitting: Internal Medicine

## 2021-12-13 ENCOUNTER — Ambulatory Visit (INDEPENDENT_AMBULATORY_CARE_PROVIDER_SITE_OTHER): Payer: Medicaid Other

## 2021-12-13 DIAGNOSIS — I442 Atrioventricular block, complete: Secondary | ICD-10-CM

## 2021-12-13 LAB — CUP PACEART REMOTE DEVICE CHECK
Battery Remaining Longevity: 93 mo
Battery Voltage: 3.02 V
Brady Statistic AP VP Percent: 0.66 %
Brady Statistic AP VS Percent: 0.01 %
Brady Statistic AS VP Percent: 97.85 %
Brady Statistic AS VS Percent: 1.48 %
Brady Statistic RA Percent Paced: 0.63 %
Brady Statistic RV Percent Paced: 97.88 %
Date Time Interrogation Session: 20231128033424
HighPow Impedance: 75 Ohm
Implantable Lead Connection Status: 753985
Implantable Lead Connection Status: 753985
Implantable Lead Connection Status: 753985
Implantable Lead Implant Date: 20220204
Implantable Lead Implant Date: 20220204
Implantable Lead Implant Date: 20230523
Implantable Lead Location: 753858
Implantable Lead Location: 753859
Implantable Lead Location: 753860
Implantable Lead Model: 4598
Implantable Lead Model: 5076
Implantable Pulse Generator Implant Date: 20230523
Lead Channel Impedance Value: 212.8 Ohm
Lead Channel Impedance Value: 234.706
Lead Channel Impedance Value: 234.706
Lead Channel Impedance Value: 253.333
Lead Channel Impedance Value: 253.333
Lead Channel Impedance Value: 323 Ohm
Lead Channel Impedance Value: 380 Ohm
Lead Channel Impedance Value: 399 Ohm
Lead Channel Impedance Value: 399 Ohm
Lead Channel Impedance Value: 456 Ohm
Lead Channel Impedance Value: 570 Ohm
Lead Channel Impedance Value: 570 Ohm
Lead Channel Impedance Value: 722 Ohm
Lead Channel Impedance Value: 760 Ohm
Lead Channel Impedance Value: 855 Ohm
Lead Channel Impedance Value: 855 Ohm
Lead Channel Impedance Value: 855 Ohm
Lead Channel Impedance Value: 893 Ohm
Lead Channel Pacing Threshold Amplitude: 0.5 V
Lead Channel Pacing Threshold Amplitude: 0.625 V
Lead Channel Pacing Threshold Amplitude: 0.875 V
Lead Channel Pacing Threshold Pulse Width: 0.4 ms
Lead Channel Pacing Threshold Pulse Width: 0.4 ms
Lead Channel Pacing Threshold Pulse Width: 0.8 ms
Lead Channel Sensing Intrinsic Amplitude: 1.375 mV
Lead Channel Sensing Intrinsic Amplitude: 1.375 mV
Lead Channel Sensing Intrinsic Amplitude: 7.875 mV
Lead Channel Sensing Intrinsic Amplitude: 7.875 mV
Lead Channel Setting Pacing Amplitude: 1.5 V
Lead Channel Setting Pacing Amplitude: 2 V
Lead Channel Setting Pacing Amplitude: 2 V
Lead Channel Setting Pacing Pulse Width: 0.4 ms
Lead Channel Setting Pacing Pulse Width: 0.8 ms
Lead Channel Setting Sensing Sensitivity: 0.3 mV

## 2022-01-12 ENCOUNTER — Telehealth (HOSPITAL_COMMUNITY): Payer: Self-pay | Admitting: *Deleted

## 2022-01-12 NOTE — Telephone Encounter (Signed)
Pet scan auth request faxed to [employer] 

## 2022-01-13 NOTE — Progress Notes (Signed)
Remote ICD transmission.   

## 2022-03-14 ENCOUNTER — Other Ambulatory Visit (HOSPITAL_COMMUNITY): Payer: Self-pay | Admitting: Internal Medicine

## 2022-03-14 ENCOUNTER — Other Ambulatory Visit: Payer: Self-pay | Admitting: Pulmonary Disease

## 2022-03-14 ENCOUNTER — Ambulatory Visit: Payer: Medicaid Other

## 2022-03-14 DIAGNOSIS — I442 Atrioventricular block, complete: Secondary | ICD-10-CM

## 2022-03-14 LAB — CUP PACEART REMOTE DEVICE CHECK
Battery Remaining Longevity: 87 mo
Battery Voltage: 3.01 V
Brady Statistic AP VP Percent: 2.08 %
Brady Statistic AP VS Percent: 0.01 %
Brady Statistic AS VP Percent: 97.31 %
Brady Statistic AS VS Percent: 0.59 %
Brady Statistic RA Percent Paced: 1.98 %
Brady Statistic RV Percent Paced: 98.02 %
Date Time Interrogation Session: 20240227022604
HighPow Impedance: 83 Ohm
Implantable Lead Connection Status: 753985
Implantable Lead Connection Status: 753985
Implantable Lead Connection Status: 753985
Implantable Lead Implant Date: 20220204
Implantable Lead Implant Date: 20220204
Implantable Lead Implant Date: 20230523
Implantable Lead Location: 753858
Implantable Lead Location: 753859
Implantable Lead Location: 753860
Implantable Lead Model: 4598
Implantable Lead Model: 5076
Implantable Pulse Generator Implant Date: 20230523
Lead Channel Impedance Value: 184.154
Lead Channel Impedance Value: 202.091
Lead Channel Impedance Value: 208.174
Lead Channel Impedance Value: 220.723
Lead Channel Impedance Value: 228 Ohm
Lead Channel Impedance Value: 266 Ohm
Lead Channel Impedance Value: 342 Ohm
Lead Channel Impedance Value: 342 Ohm
Lead Channel Impedance Value: 380 Ohm
Lead Channel Impedance Value: 399 Ohm
Lead Channel Impedance Value: 494 Ohm
Lead Channel Impedance Value: 532 Ohm
Lead Channel Impedance Value: 665 Ohm
Lead Channel Impedance Value: 665 Ohm
Lead Channel Impedance Value: 665 Ohm
Lead Channel Impedance Value: 703 Ohm
Lead Channel Impedance Value: 779 Ohm
Lead Channel Impedance Value: 836 Ohm
Lead Channel Pacing Threshold Amplitude: 0.375 V
Lead Channel Pacing Threshold Amplitude: 0.75 V
Lead Channel Pacing Threshold Amplitude: 0.875 V
Lead Channel Pacing Threshold Pulse Width: 0.4 ms
Lead Channel Pacing Threshold Pulse Width: 0.4 ms
Lead Channel Pacing Threshold Pulse Width: 0.8 ms
Lead Channel Sensing Intrinsic Amplitude: 2.625 mV
Lead Channel Sensing Intrinsic Amplitude: 2.625 mV
Lead Channel Sensing Intrinsic Amplitude: 7.875 mV
Lead Channel Sensing Intrinsic Amplitude: 7.875 mV
Lead Channel Setting Pacing Amplitude: 1.5 V
Lead Channel Setting Pacing Amplitude: 2 V
Lead Channel Setting Pacing Amplitude: 2 V
Lead Channel Setting Pacing Pulse Width: 0.4 ms
Lead Channel Setting Pacing Pulse Width: 0.8 ms
Lead Channel Setting Sensing Sensitivity: 0.3 mV

## 2022-03-23 ENCOUNTER — Other Ambulatory Visit (HOSPITAL_COMMUNITY): Payer: Self-pay | Admitting: Internal Medicine

## 2022-03-23 ENCOUNTER — Other Ambulatory Visit: Payer: Self-pay | Admitting: Cardiology

## 2022-04-19 NOTE — Progress Notes (Signed)
Remote ICD transmission.   

## 2022-06-13 ENCOUNTER — Ambulatory Visit (INDEPENDENT_AMBULATORY_CARE_PROVIDER_SITE_OTHER): Payer: Medicare Other

## 2022-06-13 DIAGNOSIS — I442 Atrioventricular block, complete: Secondary | ICD-10-CM | POA: Diagnosis not present

## 2022-06-13 LAB — CUP PACEART REMOTE DEVICE CHECK
Battery Remaining Longevity: 79 mo
Battery Voltage: 3 V
Brady Statistic AP VP Percent: 1.03 %
Brady Statistic AP VS Percent: 0.02 %
Brady Statistic AS VP Percent: 98.76 %
Brady Statistic AS VS Percent: 0.19 %
Brady Statistic RA Percent Paced: 0.98 %
Brady Statistic RV Percent Paced: 99.35 %
Date Time Interrogation Session: 20240528033423
HighPow Impedance: 62 Ohm
Implantable Lead Connection Status: 753985
Implantable Lead Connection Status: 753985
Implantable Lead Connection Status: 753985
Implantable Lead Implant Date: 20220204
Implantable Lead Implant Date: 20220204
Implantable Lead Implant Date: 20230523
Implantable Lead Location: 753858
Implantable Lead Location: 753859
Implantable Lead Location: 753860
Implantable Lead Model: 4598
Implantable Lead Model: 5076
Implantable Pulse Generator Implant Date: 20230523
Lead Channel Impedance Value: 156.606
Lead Channel Impedance Value: 160.941
Lead Channel Impedance Value: 160.941
Lead Channel Impedance Value: 166.114
Lead Channel Impedance Value: 166.114
Lead Channel Impedance Value: 247 Ohm
Lead Channel Impedance Value: 304 Ohm
Lead Channel Impedance Value: 304 Ohm
Lead Channel Impedance Value: 323 Ohm
Lead Channel Impedance Value: 342 Ohm
Lead Channel Impedance Value: 342 Ohm
Lead Channel Impedance Value: 342 Ohm
Lead Channel Impedance Value: 532 Ohm
Lead Channel Impedance Value: 570 Ohm
Lead Channel Impedance Value: 570 Ohm
Lead Channel Impedance Value: 570 Ohm
Lead Channel Impedance Value: 589 Ohm
Lead Channel Impedance Value: 627 Ohm
Lead Channel Pacing Threshold Amplitude: 0.5 V
Lead Channel Pacing Threshold Amplitude: 0.75 V
Lead Channel Pacing Threshold Amplitude: 0.75 V
Lead Channel Pacing Threshold Pulse Width: 0.4 ms
Lead Channel Pacing Threshold Pulse Width: 0.4 ms
Lead Channel Pacing Threshold Pulse Width: 0.8 ms
Lead Channel Sensing Intrinsic Amplitude: 2.375 mV
Lead Channel Sensing Intrinsic Amplitude: 2.375 mV
Lead Channel Sensing Intrinsic Amplitude: 7.875 mV
Lead Channel Sensing Intrinsic Amplitude: 7.875 mV
Lead Channel Setting Pacing Amplitude: 1.5 V
Lead Channel Setting Pacing Amplitude: 2 V
Lead Channel Setting Pacing Amplitude: 2 V
Lead Channel Setting Pacing Pulse Width: 0.4 ms
Lead Channel Setting Pacing Pulse Width: 0.8 ms
Lead Channel Setting Sensing Sensitivity: 0.3 mV

## 2022-06-16 ENCOUNTER — Other Ambulatory Visit (HOSPITAL_COMMUNITY): Payer: Self-pay | Admitting: Internal Medicine

## 2022-07-06 ENCOUNTER — Other Ambulatory Visit (HOSPITAL_COMMUNITY): Payer: Self-pay | Admitting: Internal Medicine

## 2022-07-10 ENCOUNTER — Inpatient Hospital Stay (HOSPITAL_COMMUNITY)
Admission: EM | Admit: 2022-07-10 | Discharge: 2022-07-16 | DRG: 023 | Disposition: A | Discharge status: Home / Self Care | Payer: Medicare Other | Source: Other Acute Inpatient Hospital | Attending: Neurology | Admitting: Neurology

## 2022-07-10 ENCOUNTER — Ambulatory Visit (HOSPITAL_COMMUNITY)
Admission: RE | Admit: 2022-07-10 | Discharge: 2022-07-10 | Disposition: A | Discharge status: Home / Self Care | Payer: Medicare Other | Source: Ambulatory Visit | Attending: Neurology | Admitting: Neurology

## 2022-07-10 ENCOUNTER — Other Ambulatory Visit (HOSPITAL_COMMUNITY): Payer: Self-pay | Admitting: Neurology

## 2022-07-10 ENCOUNTER — Inpatient Hospital Stay (HOSPITAL_COMMUNITY): Payer: Medicare Other | Admitting: Certified Registered Nurse Anesthetist

## 2022-07-10 ENCOUNTER — Encounter (HOSPITAL_COMMUNITY): Payer: Self-pay

## 2022-07-10 ENCOUNTER — Encounter (HOSPITAL_COMMUNITY)
Admission: EM | Disposition: A | Discharge status: Home / Self Care | Payer: Self-pay | Source: Other Acute Inpatient Hospital | Attending: Neurology

## 2022-07-10 DIAGNOSIS — I6601 Occlusion and stenosis of right middle cerebral artery: Secondary | ICD-10-CM

## 2022-07-10 DIAGNOSIS — I428 Other cardiomyopathies: Secondary | ICD-10-CM | POA: Diagnosis present

## 2022-07-10 DIAGNOSIS — I5023 Acute on chronic systolic (congestive) heart failure: Secondary | ICD-10-CM | POA: Diagnosis not present

## 2022-07-10 DIAGNOSIS — R29708 NIHSS score 8: Secondary | ICD-10-CM | POA: Diagnosis present

## 2022-07-10 DIAGNOSIS — I6521 Occlusion and stenosis of right carotid artery: Secondary | ICD-10-CM | POA: Diagnosis not present

## 2022-07-10 DIAGNOSIS — G8194 Hemiplegia, unspecified affecting left nondominant side: Secondary | ICD-10-CM | POA: Diagnosis not present

## 2022-07-10 DIAGNOSIS — I639 Cerebral infarction, unspecified: Secondary | ICD-10-CM | POA: Diagnosis present

## 2022-07-10 DIAGNOSIS — Z7982 Long term (current) use of aspirin: Secondary | ICD-10-CM

## 2022-07-10 DIAGNOSIS — K219 Gastro-esophageal reflux disease without esophagitis: Secondary | ICD-10-CM | POA: Diagnosis present

## 2022-07-10 DIAGNOSIS — I63312 Cerebral infarction due to thrombosis of left middle cerebral artery: Secondary | ICD-10-CM | POA: Diagnosis not present

## 2022-07-10 DIAGNOSIS — Z885 Allergy status to narcotic agent status: Secondary | ICD-10-CM

## 2022-07-10 DIAGNOSIS — Z79899 Other long term (current) drug therapy: Secondary | ICD-10-CM | POA: Diagnosis not present

## 2022-07-10 DIAGNOSIS — R29810 Facial weakness: Secondary | ICD-10-CM | POA: Diagnosis present

## 2022-07-10 DIAGNOSIS — I110 Hypertensive heart disease with heart failure: Secondary | ICD-10-CM | POA: Diagnosis not present

## 2022-07-10 DIAGNOSIS — F17210 Nicotine dependence, cigarettes, uncomplicated: Secondary | ICD-10-CM | POA: Diagnosis not present

## 2022-07-10 DIAGNOSIS — R531 Weakness: Secondary | ICD-10-CM

## 2022-07-10 DIAGNOSIS — I63511 Cerebral infarction due to unspecified occlusion or stenosis of right middle cerebral artery: Secondary | ICD-10-CM | POA: Diagnosis not present

## 2022-07-10 DIAGNOSIS — Z91199 Patient's noncompliance with other medical treatment and regimen due to unspecified reason: Secondary | ICD-10-CM

## 2022-07-10 DIAGNOSIS — I2510 Atherosclerotic heart disease of native coronary artery without angina pectoris: Secondary | ICD-10-CM | POA: Diagnosis present

## 2022-07-10 DIAGNOSIS — I6523 Occlusion and stenosis of bilateral carotid arteries: Secondary | ICD-10-CM | POA: Diagnosis not present

## 2022-07-10 DIAGNOSIS — E785 Hyperlipidemia, unspecified: Secondary | ICD-10-CM | POA: Insufficient documentation

## 2022-07-10 DIAGNOSIS — Z7951 Long term (current) use of inhaled steroids: Secondary | ICD-10-CM | POA: Diagnosis not present

## 2022-07-10 DIAGNOSIS — D509 Iron deficiency anemia, unspecified: Secondary | ICD-10-CM | POA: Diagnosis present

## 2022-07-10 DIAGNOSIS — T383X6A Underdosing of insulin and oral hypoglycemic [antidiabetic] drugs, initial encounter: Secondary | ICD-10-CM | POA: Diagnosis present

## 2022-07-10 DIAGNOSIS — R414 Neurologic neglect syndrome: Secondary | ICD-10-CM | POA: Diagnosis not present

## 2022-07-10 DIAGNOSIS — R000 Tachycardia, unspecified: Secondary | ICD-10-CM | POA: Insufficient documentation

## 2022-07-10 DIAGNOSIS — Z91120 Patient's intentional underdosing of medication regimen due to financial hardship: Secondary | ICD-10-CM

## 2022-07-10 DIAGNOSIS — Z95810 Presence of automatic (implantable) cardiac defibrillator: Secondary | ICD-10-CM

## 2022-07-10 DIAGNOSIS — I959 Hypotension, unspecified: Secondary | ICD-10-CM | POA: Diagnosis not present

## 2022-07-10 DIAGNOSIS — I472 Ventricular tachycardia, unspecified: Secondary | ICD-10-CM | POA: Diagnosis present

## 2022-07-10 DIAGNOSIS — D8685 Sarcoid myocarditis: Secondary | ICD-10-CM | POA: Diagnosis present

## 2022-07-10 DIAGNOSIS — I509 Heart failure, unspecified: Secondary | ICD-10-CM | POA: Diagnosis not present

## 2022-07-10 DIAGNOSIS — I5021 Acute systolic (congestive) heart failure: Secondary | ICD-10-CM | POA: Diagnosis not present

## 2022-07-10 DIAGNOSIS — I6389 Other cerebral infarction: Secondary | ICD-10-CM | POA: Diagnosis not present

## 2022-07-10 DIAGNOSIS — Z8249 Family history of ischemic heart disease and other diseases of the circulatory system: Secondary | ICD-10-CM

## 2022-07-10 DIAGNOSIS — I3139 Other pericardial effusion (noninflammatory): Secondary | ICD-10-CM | POA: Diagnosis present

## 2022-07-10 DIAGNOSIS — Z9889 Other specified postprocedural states: Secondary | ICD-10-CM | POA: Diagnosis not present

## 2022-07-10 DIAGNOSIS — I255 Ischemic cardiomyopathy: Secondary | ICD-10-CM | POA: Diagnosis not present

## 2022-07-10 DIAGNOSIS — I63411 Cerebral infarction due to embolism of right middle cerebral artery: Principal | ICD-10-CM | POA: Diagnosis present

## 2022-07-10 DIAGNOSIS — G8191 Hemiplegia, unspecified affecting right dominant side: Secondary | ICD-10-CM | POA: Diagnosis not present

## 2022-07-10 DIAGNOSIS — Z87891 Personal history of nicotine dependence: Secondary | ICD-10-CM | POA: Diagnosis not present

## 2022-07-10 DIAGNOSIS — E875 Hyperkalemia: Secondary | ICD-10-CM | POA: Diagnosis present

## 2022-07-10 DIAGNOSIS — I6310 Cerebral infarction due to embolism of unspecified precerebral artery: Secondary | ICD-10-CM

## 2022-07-10 DIAGNOSIS — I5022 Chronic systolic (congestive) heart failure: Secondary | ICD-10-CM | POA: Insufficient documentation

## 2022-07-10 DIAGNOSIS — I5041 Acute combined systolic (congestive) and diastolic (congestive) heart failure: Secondary | ICD-10-CM | POA: Diagnosis not present

## 2022-07-10 DIAGNOSIS — I493 Ventricular premature depolarization: Secondary | ICD-10-CM | POA: Diagnosis present

## 2022-07-10 DIAGNOSIS — J811 Chronic pulmonary edema: Secondary | ICD-10-CM | POA: Diagnosis not present

## 2022-07-10 DIAGNOSIS — S2231XA Fracture of one rib, right side, initial encounter for closed fracture: Secondary | ICD-10-CM | POA: Diagnosis not present

## 2022-07-10 DIAGNOSIS — G4733 Obstructive sleep apnea (adult) (pediatric): Secondary | ICD-10-CM | POA: Diagnosis present

## 2022-07-10 DIAGNOSIS — E119 Type 2 diabetes mellitus without complications: Secondary | ICD-10-CM | POA: Diagnosis not present

## 2022-07-10 DIAGNOSIS — R4781 Slurred speech: Secondary | ICD-10-CM | POA: Diagnosis not present

## 2022-07-10 DIAGNOSIS — H5340 Unspecified visual field defects: Secondary | ICD-10-CM | POA: Diagnosis present

## 2022-07-10 DIAGNOSIS — I5043 Acute on chronic combined systolic (congestive) and diastolic (congestive) heart failure: Secondary | ICD-10-CM | POA: Diagnosis not present

## 2022-07-10 DIAGNOSIS — Z9282 Status post administration of tPA (rtPA) in a different facility within the last 24 hours prior to admission to current facility: Secondary | ICD-10-CM

## 2022-07-10 DIAGNOSIS — R29818 Other symptoms and signs involving the nervous system: Secondary | ICD-10-CM | POA: Diagnosis not present

## 2022-07-10 HISTORY — PX: IR CT HEAD LTD: IMG2386

## 2022-07-10 HISTORY — PX: IR US GUIDE VASC ACCESS RIGHT: IMG2390

## 2022-07-10 HISTORY — PX: RADIOLOGY WITH ANESTHESIA: SHX6223

## 2022-07-10 HISTORY — PX: IR PERCUTANEOUS ART THROMBECTOMY/INFUSION INTRACRANIAL INC DIAG ANGIO: IMG6087

## 2022-07-10 SURGERY — RADIOLOGY WITH ANESTHESIA
Anesthesia: General

## 2022-07-10 MED ORDER — EPHEDRINE SULFATE-NACL 50-0.9 MG/10ML-% IV SOSY
PREFILLED_SYRINGE | INTRAVENOUS | Status: DC | PRN
  Administered 2022-07-10: 10 mg via INTRAVENOUS

## 2022-07-10 MED ORDER — LIDOCAINE 2% (20 MG/ML) 5 ML SYRINGE
INTRAMUSCULAR | Status: DC | PRN
  Administered 2022-07-10: 40 mg via INTRAVENOUS

## 2022-07-10 MED ORDER — PHENYLEPHRINE 80 MCG/ML (10ML) SYRINGE FOR IV PUSH (FOR BLOOD PRESSURE SUPPORT)
PREFILLED_SYRINGE | INTRAVENOUS | Status: DC | PRN
  Administered 2022-07-10 (×2): 160 ug via INTRAVENOUS

## 2022-07-10 MED ORDER — PHENYLEPHRINE HCL-NACL 20-0.9 MG/250ML-% IV SOLN
INTRAVENOUS | Status: DC | PRN
  Administered 2022-07-10 (×2): 40 ug/min via INTRAVENOUS

## 2022-07-10 MED ORDER — SODIUM CHLORIDE 0.9 % IV SOLN: INTRAVENOUS | Status: DC | PRN

## 2022-07-10 MED ORDER — SUCCINYLCHOLINE CHLORIDE 200 MG/10ML IV SOSY
PREFILLED_SYRINGE | INTRAVENOUS | Status: DC | PRN
  Administered 2022-07-10: 200 mg via INTRAVENOUS

## 2022-07-10 MED ORDER — LACTATED RINGERS IV SOLN: INTRAVENOUS | Status: DC | PRN

## 2022-07-10 MED ORDER — FENTANYL CITRATE (PF) 100 MCG/2ML IJ SOLN
INTRAMUSCULAR | Status: AC
  Filled 2022-07-10: qty 2

## 2022-07-10 MED ORDER — DEXAMETHASONE SODIUM PHOSPHATE 10 MG/ML IJ SOLN
INTRAMUSCULAR | Status: DC | PRN
  Administered 2022-07-10: 5 mg via INTRAVENOUS

## 2022-07-10 MED ORDER — FENTANYL CITRATE (PF) 100 MCG/2ML IJ SOLN
INTRAMUSCULAR | Status: DC | PRN
  Administered 2022-07-10: 100 ug via INTRAVENOUS

## 2022-07-10 MED ORDER — ROCURONIUM BROMIDE 10 MG/ML (PF) SYRINGE
PREFILLED_SYRINGE | INTRAVENOUS | Status: DC | PRN
  Administered 2022-07-10: 40 mg via INTRAVENOUS

## 2022-07-10 MED ORDER — IOHEXOL 300 MG/ML  SOLN
150.0000 mL | Freq: Once | INTRAMUSCULAR | Status: AC | PRN
  Administered 2022-07-10: 40 mL via INTRA_ARTERIAL

## 2022-07-10 MED ORDER — ETOMIDATE 2 MG/ML IV SOLN
INTRAVENOUS | Status: DC | PRN
  Administered 2022-07-10: 20 mg via INTRAVENOUS

## 2022-07-10 MED ORDER — ONDANSETRON HCL 4 MG/2ML IJ SOLN
INTRAMUSCULAR | Status: DC | PRN
  Administered 2022-07-10: 4 mg via INTRAVENOUS

## 2022-07-10 NOTE — Anesthesia Procedure Notes (Signed)
Arterial Line Insertion Start/End6/24/2024 10:34 AM, 07/10/2022 10:39 AM Performed by: Tressia Miners, CRNA  Patient location: OOR procedure area. Preanesthetic checklist: patient identified, IV checked, risks and benefits discussed, surgical consent, monitors and equipment checked, pre-op evaluation, timeout performed and anesthesia consent Lidocaine 1% used for infiltration Left, radial was placed Catheter size: 20 G Hand hygiene performed , maximum sterile barriers used  and Seldinger technique used Allen's test indicative of satisfactory collateral circulation Attempts: 1 Procedure performed without using ultrasound guided technique. Following insertion, dressing applied and Biopatch. Post procedure assessment: normal and unchanged  Patient tolerated the procedure well with no immediate complications.

## 2022-07-10 NOTE — Anesthesia Procedure Notes (Signed)
Procedure Name: Intubation Date/Time: 07/10/2022 10:43 PM  Performed by: Tressia Miners, CRNAPre-anesthesia Checklist: Patient identified, Emergency Drugs available, Suction available, Patient being monitored and Timeout performed Patient Re-evaluated:Patient Re-evaluated prior to induction Oxygen Delivery Method: Circle system utilized Preoxygenation: Pre-oxygenation with 100% oxygen Induction Type: IV induction, Rapid sequence and Cricoid Pressure applied Laryngoscope Size: Mac and 4 Grade View: Grade I Tube type: Oral Tube size: 7.5 mm Number of attempts: 1 Airway Equipment and Method: Stylet Placement Confirmation: ETT inserted through vocal cords under direct vision, positive ETCO2 and breath sounds checked- equal and bilateral Secured at: 23 cm Tube secured with: Tape Dental Injury: Teeth and Oropharynx as per pre-operative assessment  Comments: Smooth IV Induction. Eyes taped. RSI performed. DL x 1 with grade 1 view. Atraumatically placed, teeth and lip remain intact as pre-op. Secured with tape. Bilateral breath sounds +/=, EtCO2 +, Adequate TV, VSS.

## 2022-07-10 NOTE — H&P (Incomplete)
Admission H&P    Chief Complaint: Acute onset of left sided weakness  HPI: Charles Holmes is a 57 y.o. 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 [name] was 7:30 PM. BP prior to arrival was in the low range at 104/56 per EMS. CT head was negative for acute abnormality with ASPECTS of 10. Teleneurology was consulted and he was deemed to be a tPA candidate, with tPA infusion started at [facility]. CTA revealed an acute right M1 occlusion. After telephone discussion between EDP at [facility] and Neurology at [facility] as well as after obtaining informed consent from the patient in person and his daughter by telephone [phone]), decision was made to transport emergently to [facility] for mechanical thrombectomy. NIHSS was reported to be 6 at [facility].   Modified Rankin scale: 0   Past Medical History:  Diagnosis Date   CAD (coronary artery disease)    CHF (congestive heart failure) (HCC)    GERD (gastroesophageal reflux disease)    Hyperlipidemia    Hypertension    Systolic heart failure (HCC) 2021   LVEF 18%, RVEF 38% on cardiac MRI 12/19/2019. possible cardiac sarcoidosis.   Wide-complex tachycardia 2021   wears LifeVest    Past Surgical History:  Procedure Laterality Date   BIV UPGRADE N/A 06/07/2021   Procedure: BIV ICD UPGRADE;  Surgeon: Regan Lemming, MD;  Location: [facility] INVASIVE CV LAB;  Service: Cardiovascular;  Laterality: N/A;   ICD IMPLANT N/A 02/20/2020   Procedure: ICD IMPLANT;  Surgeon: Regan Lemming, MD;  Location: [facility] INVASIVE CV LAB;  Service: Cardiovascular;  Laterality: N/A;   RIGHT/LEFT HEART CATH AND CORONARY ANGIOGRAPHY N/A 12/16/2019   Procedure: RIGHT/LEFT HEART CATH AND CORONARY ANGIOGRAPHY;  Surgeon: Swaziland, Peter M, MD;  Location: [facility] INVASIVE CV LAB;  Service: Cardiovascular;  Laterality: N/A;    Family History   Problem Relation Age of Onset   Hypertension Father    Heart disease Father    Heart disease Mother    Social History:  reports that he has quit smoking. His smoking use included cigarettes. He has never used smokeless tobacco. He reports current alcohol use of about 6.0 standard drinks of alcohol per week. He reports current drug use. Drug: Marijuana.  Allergies:  Allergies  Allergen Reactions   Percocet [Oxycodone-Acetaminophen] Itching    Medications Prior to Admission  Medication Sig Dispense Refill   aspirin EC 81 MG EC tablet Take 1 tablet (81 mg total) by mouth daily. 90 tablet 3   atorvastatin (LIPITOR) 80 MG tablet TAKE 1 TABLET BY MOUTH EVERY DAY 90 tablet 5   carvedilol (COREG) 3.125 MG tablet TAKE 1 TABLET BY MOUTH TWICE A DAY 180 tablet 3   dapagliflozin propanediol (FARXIGA) 10 MG TABS tablet Take 1 tablet (10 mg total) by mouth daily before breakfast. 30 tablet 11   dorzolamide-timolol (COSOPT) 22.3-6.8 MG/ML ophthalmic solution Place 1 drop into both eyes in the morning and at bedtime.     fluticasone-salmeterol (ADVAIR HFA) 230-21 MCG/ACT inhaler Inhale 2 puffs into the lungs 2 (two) times daily. 1 each 12   folic acid (FOLVITE) 1 MG tablet TAKE 1 TABLET BY MOUTH EVERY DAY 90 tablet 2   furosemide (LASIX) 40 MG tablet Take 40 mg by mouth daily as needed for fluid.     latanoprost (XALATAN) 0.005 % ophthalmic solution Place  1 drop into both eyes at bedtime.     losartan (COZAAR) 25 MG tablet TAKE 1 TABLET BY MOUTH IN THE MORNING AND AT BEDTIME 180 tablet 1   methotrexate (RHEUMATREX) 2.5 MG tablet Take 8 tablets (20 mg total) by mouth once a week. NEEDS FOLLOW UP APPOINTMENT FOR MORE REFILLS 96 tablet 0   mexiletine (MEXITIL) 250 MG capsule Take 1 capsule (250 mg total) by mouth 2 (two) times daily. 180 capsule 3   Multiple Vitamins-Minerals (MULTIVITAMIN WITH MINERALS) tablet Take 1 tablet by mouth daily. (Patient not taking: Reported on 11/07/2021)     pantoprazole  (PROTONIX) 40 MG tablet Take 40 mg by mouth daily before breakfast.     spironolactone (ALDACTONE) 25 MG tablet TAKE 1/2 TABLET BY MOUTH DAILY 45 tablet 0    ROS: As per HPI. Detailed ROS deferred in the context of acuity of presentation.  Physical Examination: Vitals: BP 82/30 by EMS en route RR 18 Afebrile   Physical Exam  HEENT-  [place]/AT    Lungs- Respirations unlabored Extremities- No edema  Neurological Examination Mental Status: Alert and oriented to his age, the month, year, day of the week, city and state. Speech fluent with intact naming and comprehension. Able to follow all commands without difficulty. Cranial Nerves: II: Left sided visual field cut.   III,IV, VI: No ptosis. Eyes are conjugate with right sided gaze preference, but able to follow command to gaze fully to the left without difficulty. No nystagmus.  V: FT sensation decreased on the left  VII: Left facial droop VIII: Hearing intact to voice IX,X: No hoarseness XI: Head preferentially rotated to the right XII: Midline tongue extension Motor: RUE and RLE 5/5 without drift LUE 4/5 with drift LLE 4/5 with drift Sensory: Decreased FT to LUE and LLE. With stronger touch stimulus, there is extinction on the left.    Deep Tendon Reflexes: 2+ and symmetric brachioradialis and patellar reflexes.  Cerebellar: No ataxia with FNF bilaterally, but has difficulty targeting examiner's finger on the left.   Gait: Deferred  NIHSS: 8  No results found for this or any previous visit (from the past 48 hour(s)). No results found.  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 [name] was 7:30 PM. BP prior to arrival was in the low range at 104/56 per EMS. CT head was negative for acute abnormality with ASPECTS of 10. Teleneurology was  consulted and he was deemed to be a tPA candidate, with tPA infusion started at [facility]. CTA revealed an acute right M1 occlusion. After telephone discussion between EDP at [facility] and Neurology at [facility] as well as after obtaining informed consent from the patient in person and his daughter by telephone [phone]), decision was made to transport emergently to [facility] for mechanical thrombectomy. NIHSS was reported to be 6 at [facility].  - Neurological exam on arrival at [facility] reveals left hemineglect, left visual field cut and left sided weakness. NIHSS 8.  - Classifiable as having failed ASA monotherapy.  - The patient is a VIR candidate. While at [facility] awaiting transfer to [facility], the risks/benefits of mechanical thrombectomy were discussed extensively with the patient and his daughter, including approximately 50% chance of significant improvement relative to an approximate 10% chance of subarachnoid hemorrhage with possibility of significant worsening including death. The patient and his  daughter expressed understanding and provided informed consent to proceed with VIR. All questions answered.   - Stroke risk factors: CAD, systolic heart failure, HLD and HTN   Plan: 1. Admitting to Neuro ICU following VIR.  2. Post-tPA and VIR order set to include frequent neuro checks and BP management.  3. No antiplatelet medications or anticoagulants for at least 24 hours following tPA.  4. DVT prophylaxis with SCDs.  5. Will need to be started on a statin.  6. Will need escalation of antiplatelet therapy to DAPT if follow up CT at 24 hours is negative for hemorrhagic conversion. 7. Cardiac telemetry 8. TTE.  9. MRI brain if ICD is MRI-compatible  10. PT/OT/Speech.  11. NPO until passes swallow evaluation.  12. Fasting lipid panel, HgbA1c 13. The patient states that he wishes to be full code 14. IVNS at 50 cc/hr.  15. BP is soft despite IVF. Given CHF, he may need Cardiology consult in the AM. Holding his home antihypertensives  for now. BP goal has been set to 120-160 following VIR. Clevidipine has been ordered for SBP > 160.    Electronically signed: Dr. Caryl Pina 07/10/2022, 10:47 PM

## 2022-07-10 NOTE — Procedures (Signed)
INTERVENTIONAL NEURORADIOLOGY BRIEF POSTPROCEDURE NOTE  DIAGNOSTIC CEREBRAL ANGIOGRAM AND MECHANICAL THROMBECTOMY  Attending: Dr. Baldemar Lenis  Diagnosis: Right M3/MCA occlusion  Access site: RCFA  Access closure: Perclose prostyle  Anesthesia: GETA  Medication used: refer to anesthesia documentation.  Complications: None.  Estimated blood loss: 30 mL  Specimen: None.  Findings: Interval recanalization of the Right M1/MCA likely related to tPA administration with distal embolization to a proximal M3/MCA posterior division branch. Mechanical thrombectomy performed with direct contact aspiration resulting in complete recanalization after 1 pass (TICI 3). No thromboembolic or hemorrhagic complication.  The patient tolerated the procedure well and was transferred to ICU in stable condition.   PLAN: - Bed rest x 6 hours post femoral puncture. No PT within the first 12 hours. - SBP 120-160 mmHg

## 2022-07-10 NOTE — Code Documentation (Signed)
Code IR paged @ 2142 with L sided weakness, facial droop and AMS. Pt arrived to ED via [facility] @ 2213. TPA completed PTA. NIH 5. Pt arrived to IR suite at 2217.

## 2022-07-10 NOTE — Anesthesia Preprocedure Evaluation (Addendum)
Anesthesia Evaluation  Patient identified by MRN, date of birth, ID band Patient awake    Reviewed: Allergy & Precautions, NPO status , Patient's Chart, lab work & pertinent test results, reviewed documented beta blocker date and time Preop documentation limited or incomplete due to emergent nature of procedure.  History of Anesthesia Complications Negative for: history of anesthetic complications  Airway Mallampati: II  TM Distance: >3 FB Neck ROM: Full    Dental  (+) Poor Dentition, Missing, Loose, Dental Advisory Given, Chipped   Pulmonary former smoker   breath sounds clear to auscultation       Cardiovascular hypertension, Pt. on medications and Pt. on home beta blockers pulmonary hypertension(-) angina + CAD ('21cath:  Prox LAD lesion is 70% stenosed.  LV end diastolic pressure is moderately elevated.  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 LV EF 20 to 25%. The LV has severely decreased function, global hypokinesis. The left ventricular  internal cavity size was severely dilated. Grade II diastolic dysfunction  (pseudonormalization). Elevated left ventricular end-diastolic pressure.   2. AICD wires noted in RA/RV. RVF is normal. The right ventricular size is normal.   3. Left atrial size was mildly dilated.   4. The mitral valve is abnormal. Trivial MR. No mitral stenosis.   5. The aortic valve was not well visualized. There is mild calcification of the aortic valve. There is mild thickening of the aortic valve. Aortic valve regurgitation is trivial. Aortic valve sclerosis is present, with no aortic valve stenosis.      Neuro/Psych CVA (acute CVA)    GI/Hepatic ,GERD  Medicated and Controlled,,(+)     substance abuse  marijuana use  Endo/Other    Morbid obesity  Renal/GU Renal InsufficiencyRenal disease     Musculoskeletal   Abdominal   Peds  Hematology TPA   Anesthesia Other Findings   Reproductive/Obstetrics                             Anesthesia Physical Anesthesia Plan  ASA: 4 and emergent  Anesthesia Plan: General   Post-op Pain Management: Minimal or no pain anticipated   Induction: Intravenous  PONV Risk Score and Plan: 2 and Ondansetron and Dexamethasone  Airway Management Planned: Oral ETT  Additional Equipment: Arterial line  Intra-op Plan:   Post-operative Plan: Possible Post-op intubation/ventilation  Informed Consent: I have reviewed the patients History and Physical, chart, labs and discussed the procedure including the risks, benefits and alternatives for the proposed anesthesia with the patient or authorized representative who has indicated his/her understanding and acceptance.     Dental advisory given  Plan Discussed with: CRNA and Surgeon  Anesthesia Plan Comments: (Emergent, discussed with patient)       Anesthesia Quick Evaluation

## 2022-07-10 NOTE — Progress Notes (Signed)
Remote ICD transmission.   

## 2022-07-11 ENCOUNTER — Inpatient Hospital Stay (HOSPITAL_COMMUNITY): Payer: Medicare Other

## 2022-07-11 ENCOUNTER — Encounter (HOSPITAL_COMMUNITY): Payer: Self-pay | Admitting: Radiology

## 2022-07-11 DIAGNOSIS — I5021 Acute systolic (congestive) heart failure: Secondary | ICD-10-CM | POA: Diagnosis not present

## 2022-07-11 DIAGNOSIS — Z9889 Other specified postprocedural states: Secondary | ICD-10-CM

## 2022-07-11 DIAGNOSIS — I6389 Other cerebral infarction: Secondary | ICD-10-CM

## 2022-07-11 DIAGNOSIS — I63411 Cerebral infarction due to embolism of right middle cerebral artery: Secondary | ICD-10-CM

## 2022-07-11 DIAGNOSIS — I639 Cerebral infarction, unspecified: Secondary | ICD-10-CM | POA: Diagnosis present

## 2022-07-11 DIAGNOSIS — I5023 Acute on chronic systolic (congestive) heart failure: Secondary | ICD-10-CM

## 2022-07-11 LAB — COMPREHENSIVE METABOLIC PANEL
ALT: 25 U/L (ref 0–44)
AST: 25 U/L (ref 15–41)
Albumin: 3.5 g/dL (ref 3.5–5.0)
Alkaline Phosphatase: 79 U/L (ref 38–126)
Anion gap: 12 (ref 5–15)
BUN: 27 mg/dL — ABNORMAL HIGH (ref 6–20)
CO2: 27 mmol/L (ref 22–32)
Calcium: 8.7 mg/dL — ABNORMAL LOW (ref 8.9–10.3)
Chloride: 97 mmol/L — ABNORMAL LOW (ref 98–111)
Creatinine, Ser: 1.11 mg/dL (ref 0.61–1.24)
GFR, Estimated: >60 mL/min (ref >60)
Glucose, Bld: 125 mg/dL — ABNORMAL HIGH (ref 70–99)
Potassium: 4.2 mmol/L (ref 3.5–5.1)
Sodium: 136 mmol/L (ref 135–145)
Total Bilirubin: 2.4 mg/dL — ABNORMAL HIGH (ref 0.3–1.2)
Total Protein: 6.1 g/dL — ABNORMAL LOW (ref 6.5–8.1)

## 2022-07-11 LAB — CBC
HCT: 35.3 % — ABNORMAL LOW (ref 39.0–52.0)
Hemoglobin: 11.3 g/dL — ABNORMAL LOW (ref 13.0–17.0)
MCH: 33.4 pg (ref 26.0–34.0)
MCHC: 32 g/dL (ref 30.0–36.0)
MCV: 104.4 fL — ABNORMAL HIGH (ref 80.0–100.0)
Platelets: 163 10*3/uL (ref 150–400)
RBC: 3.38 MIL/uL — ABNORMAL LOW (ref 4.22–5.81)
RDW: 14.1 % (ref 11.5–15.5)
WBC: 9.9 10*3/uL (ref 4.0–10.5)
nRBC: 0 % (ref 0.0–0.2)

## 2022-07-11 LAB — HEMOGLOBIN A1C
Hgb A1c MFr Bld: 5.1 % (ref 4.8–5.6)
Mean Plasma Glucose: 99.67 mg/dL

## 2022-07-11 LAB — TSH: TSH: 0.974 u[IU]/mL (ref 0.350–4.500)

## 2022-07-11 LAB — POCT I-STAT 7, (LYTES, BLD GAS, ICA,H+H)
Acid-Base Excess: 2 mmol/L (ref 0.0–2.0)
Bicarbonate: 30.3 mmol/L — ABNORMAL HIGH (ref 20.0–28.0)
Calcium, Ion: 1.21 mmol/L (ref 1.15–1.40)
HCT: 35 % — ABNORMAL LOW (ref 39.0–52.0)
Hemoglobin: 11.9 g/dL — ABNORMAL LOW (ref 13.0–17.0)
O2 Saturation: 96 %
Patient temperature: 98.6
Potassium: 4.4 mmol/L (ref 3.5–5.1)
Sodium: 138 mmol/L (ref 135–145)
TCO2: 32 mmol/L (ref 22–32)
pCO2 arterial: 68.1 mmHg (ref 32–48)
pH, Arterial: 7.256 — ABNORMAL LOW (ref 7.35–7.45)
pO2, Arterial: 97 mmHg (ref 83–108)

## 2022-07-11 LAB — RAPID URINE DRUG SCREEN, HOSP PERFORMED
Amphetamines: NOT DETECTED
Barbiturates: NOT DETECTED
Benzodiazepines: NOT DETECTED
Cocaine: NOT DETECTED
Opiates: NOT DETECTED
Tetrahydrocannabinol: NOT DETECTED

## 2022-07-11 LAB — HIV ANTIBODY (ROUTINE TESTING W REFLEX): HIV Screen 4th Generation wRfx: NONREACTIVE

## 2022-07-11 LAB — ECHOCARDIOGRAM COMPLETE
AR max vel: 2.82 cm2
AV Area VTI: 2.4 cm2
AV Area mean vel: 2.51 cm2
AV Mean grad: 4 mmHg
AV Peak grad: 6.5 mmHg
Ao pk vel: 1.27 m/s
Area-P 1/2: 4.21 cm2
Est EF: <20
MV M vel: 3.62 m/s
MV Peak grad: 52.4 mmHg
Radius: 1.4 cm
S' Lateral: 7 cm

## 2022-07-11 LAB — LIPID PANEL
Cholesterol: 102 mg/dL (ref 0–200)
HDL: 33 mg/dL — ABNORMAL LOW (ref >40)
LDL Cholesterol: 59 mg/dL (ref 0–99)
Total CHOL/HDL Ratio: 3.1 RATIO
Triglycerides: 50 mg/dL (ref <150)
VLDL: 10 mg/dL (ref 0–40)

## 2022-07-11 LAB — PROTIME-INR
INR: 1.3 — ABNORMAL HIGH (ref 0.8–1.2)
Prothrombin Time: 15.9 seconds — ABNORMAL HIGH (ref 11.4–15.2)

## 2022-07-11 LAB — MRSA NEXT GEN BY PCR, NASAL: MRSA by PCR Next Gen: NOT DETECTED

## 2022-07-11 LAB — APTT: aPTT: 27 seconds (ref 24–36)

## 2022-07-11 LAB — BRAIN NATRIURETIC PEPTIDE: B Natriuretic Peptide: 1588.2 pg/mL — ABNORMAL HIGH (ref 0.0–100.0)

## 2022-07-11 MED ORDER — CLEVIDIPINE BUTYRATE 0.5 MG/ML IV EMUL: 0.0000 mg/h | INTRAVENOUS | Status: DC

## 2022-07-11 MED ORDER — SODIUM CHLORIDE 0.9 % IV BOLUS
250.0000 mL | INTRAVENOUS | Status: AC | PRN
  Administered 2022-07-11 (×2): 250 mL via INTRAVENOUS

## 2022-07-11 MED ORDER — DIGOXIN 125 MCG PO TABS
0.1250 mg | ORAL_TABLET | Freq: Every day | ORAL | Status: DC
  Administered 2022-07-11 – 2022-07-16 (×6): 0.125 mg via ORAL
  Filled 2022-07-11 (×7): qty 1

## 2022-07-11 MED ORDER — CHLORHEXIDINE GLUCONATE CLOTH 2 % EX PADS
6.0000 | MEDICATED_PAD | Freq: Every day | CUTANEOUS | Status: DC
  Administered 2022-07-11 – 2022-07-16 (×5): 6 via TOPICAL

## 2022-07-11 MED ORDER — PERFLUTREN LIPID MICROSPHERE
1.0000 mL | INTRAVENOUS | Status: AC | PRN
  Administered 2022-07-11: 2 mL via INTRAVENOUS

## 2022-07-11 MED ORDER — SUGAMMADEX SODIUM 200 MG/2ML IV SOLN
INTRAVENOUS | Status: DC | PRN
  Administered 2022-07-10: 200 mg via INTRAVENOUS

## 2022-07-11 MED ORDER — LORAZEPAM 2 MG/ML IJ SOLN
INTRAMUSCULAR | Status: AC
  Filled 2022-07-11: qty 1

## 2022-07-11 MED ORDER — STROKE: EARLY STAGES OF RECOVERY BOOK
Freq: Once | Status: AC
  Filled 2022-07-11: qty 1

## 2022-07-11 MED ORDER — SENNOSIDES-DOCUSATE SODIUM 8.6-50 MG PO TABS: 1.0000 | ORAL_TABLET | Freq: Every evening | ORAL | Status: DC | PRN

## 2022-07-11 MED ORDER — ATORVASTATIN CALCIUM 80 MG PO TABS
80.0000 mg | ORAL_TABLET | Freq: Every day | ORAL | Status: DC
  Administered 2022-07-11 – 2022-07-16 (×6): 80 mg via ORAL
  Filled 2022-07-11 (×6): qty 1

## 2022-07-11 MED ORDER — SODIUM CHLORIDE 0.9 % IV SOLN: INTRAVENOUS | Status: DC

## 2022-07-11 MED ORDER — LORAZEPAM 1 MG PO TABS: 1.0000 mg | ORAL_TABLET | Freq: Once | ORAL | Status: DC

## 2022-07-11 MED ORDER — FUROSEMIDE 10 MG/ML IJ SOLN
80.0000 mg | Freq: Once | INTRAMUSCULAR | Status: AC
  Administered 2022-07-11: 80 mg via INTRAVENOUS
  Filled 2022-07-11: qty 8

## 2022-07-11 MED ORDER — LORAZEPAM 2 MG/ML IJ SOLN
1.0000 mg | Freq: Once | INTRAMUSCULAR | Status: AC
  Administered 2022-07-11: 1 mg via INTRAVENOUS

## 2022-07-11 MED ORDER — ORAL CARE MOUTH RINSE: 15.0000 mL | OROMUCOSAL | Status: DC | PRN

## 2022-07-11 MED ORDER — MEXILETINE HCL 250 MG PO CAPS
250.0000 mg | ORAL_CAPSULE | Freq: Two times a day (BID) | ORAL | Status: DC
  Administered 2022-07-11 – 2022-07-16 (×11): 250 mg via ORAL
  Filled 2022-07-11 (×15): qty 1

## 2022-07-11 MED ORDER — CHLORHEXIDINE GLUCONATE CLOTH 2 % EX PADS: 6.0000 | MEDICATED_PAD | Freq: Every day | CUTANEOUS | Status: DC

## 2022-07-11 NOTE — Evaluation (Signed)
Occupational Therapy Evaluation Patient Details Name: Charles Holmes MRN: [id number] DOB: 05-12-1965 Today's Date: 07/11/2022   History of Present Illness 57 yo male s/p trombectomy R MCA PMH CAD, CHF, HLD, HTN, systolic HF, ICD implant, R & L heart cath   Clinical Impression   PT admitted with R MCA CVA. Pt currently with functional limitiations due to the deficits listed below (see OT problem list). Pt at baseline lives alone and cares for his cat Mustard. Pt lives near family. Pt has a sister and additional family admitted to our system at this time with his pregnant daughter helping with care. Daughter also assist with patient's elderly parents ( the grandparents). Pt at this time appears to be at an adequate level for home d/c plans. OT to continue to focus on balance deficits with adls. Pt reports missing medications at home due to running out. Discussed with daughter about a pill box system to make sure the patient does not run out of medications in future. Pt said I missed to aspirin because I just hadnt gotten to the store yet and I forgot.   Pt will benefit from skilled OT to increase their independence and safety with adls and balance to allow discharge home.       Recommendations for follow up therapy are one component of a multi-disciplinary discharge planning process, led by the attending physician.  Recommendations may be updated based on patient status, additional functional criteria and insurance authorization.   Assistance Recommended at Discharge PRN  Patient can return home with the following A little help with bathing/dressing/bathroom;Assist for transportation    Functional Status Assessment  Patient has had a recent decline in their functional status and demonstrates the ability to make significant improvements in function in a reasonable and predictable amount of time.  Equipment Recommendations  None recommended by OT    Recommendations for Other Services        Precautions / Restrictions Precautions Precautions: Fall      Mobility Bed Mobility Overal bed mobility: Modified Independent                  Transfers Overall transfer level: Needs assistance   Transfers: Sit to/from Stand Sit to Stand: Min guard                  Balance Overall balance assessment: Mild deficits observed, not formally tested                                         ADL either performed or assessed with clinical judgement   ADL Overall ADL's : Needs assistance/impaired Eating/Feeding: Set up   Grooming: Set up   Upper Body Bathing: Set up   Lower Body Bathing: Minimal assistance   Upper Body Dressing : Set up   Lower Body Dressing: Moderate assistance Lower Body Dressing Details (indicate cue type and reason): pt needed socks placed over toes to thread Toilet Transfer: Min guard           Functional mobility during ADLs: Min guard General ADL Comments: daughter and son in law present throughout session     Vision Baseline Vision/History: 1 Wears glasses;3 Glaucoma (drops in eyes daily - reports missing drops at times)       Perception     Praxis      Pertinent Vitals/Pain Pain Assessment Pain Assessment: No/denies pain  Hand Dominance Right   Extremity/Trunk Assessment Upper Extremity Assessment Upper Extremity Assessment: LUE deficits/detail LUE Deficits / Details: reports not sensation changes however pt with blue block at this time s/p procedure. Ot to further assess hand when allowed more access   Lower Extremity Assessment Lower Extremity Assessment: Defer to PT evaluation   Cervical / Trunk Assessment Cervical / Trunk Assessment: Normal   Communication Communication Communication: No difficulties   Cognition Arousal/Alertness: Awake/alert Behavior During Therapy: WFL for tasks assessed/performed Overall Cognitive Status: Within Functional Limits for tasks assessed                                        General Comments  VSS required 2L 02 due to RA 88%    Exercises     Shoulder Instructions      Home Living Family/patient expects to be discharged to:: Private residence Living Arrangements: Alone Available Help at Discharge: Available PRN/intermittently Type of Home: Mobile home Home Access: Stairs to enter Entrance Stairs-Number of Steps: 2 Entrance Stairs-Rails: Right;Left Home Layout: One level     Bathroom Shower/Tub: Producer, television/film/video: Standard     Home Equipment: None   Additional Comments: has a large male orange cat named Mustard      Prior Functioning/Environment Prior Level of Function : Independent/Modified Independent;Driving;Other (comment) (on disability)               ADLs Comments: does cooking / cleaning        OT Problem List: Decreased activity tolerance;Impaired balance (sitting and/or standing);Decreased cognition;Decreased safety awareness;Cardiopulmonary status limiting activity;Obesity;Increased edema      OT Treatment/Interventions: Self-care/ADL training;Therapeutic exercise;Energy conservation;Neuromuscular education;DME and/or AE instruction;Therapeutic activities;Patient/family education;Balance training    OT Goals(Current goals can be found in the care plan section) Acute Rehab OT Goals Patient Stated Goal: to get to go home OT Goal Formulation: With patient/family Time For Goal Achievement: 07/25/22 Potential to Achieve Goals: Good  OT Frequency: Min 2X/week    Co-evaluation PT/OT/SLP Co-Evaluation/Treatment: Yes Reason for Co-Treatment: For patient/therapist safety;To address functional/ADL transfers   OT goals addressed during session: ADL's and self-care      AM-PAC OT "6 Clicks" Daily Activity     Outcome Measure Help from another person eating meals?: A Little Help from another person taking care of personal grooming?: A Little Help from another person  toileting, which includes using toliet, bedpan, or urinal?: A Little Help from another person bathing (including washing, rinsing, drying)?: A Little Help from another person to put on and taking off regular upper body clothing?: A Little Help from another person to put on and taking off regular lower body clothing?: A Little 6 Click Score: 18   End of Session Equipment Utilized During Treatment: Gait belt Nurse Communication: Mobility status;Precautions  Activity Tolerance: Patient tolerated treatment well Patient left: in bed;with call bell/phone within reach;with bed alarm set;with family/visitor present  OT Visit Diagnosis: Unsteadiness on feet (R26.81)                Time: 1230-1305 OT Time Calculation (min): 35 min Charges:  OT General Charges $OT Visit: 1 Visit OT Evaluation $OT Eval Moderate Complexity: 1 Mod   Brynn, OTR/L  Acute Rehabilitation Services Office: [phone] .   Mateo Flow 07/11/2022, 4:52 PM

## 2022-07-11 NOTE — Progress Notes (Signed)
    Chief Complaint: Patient was seen today post CVA intervention  Referring Physician(s): Dr. Roda Shutters  Supervising Physician: Baldemar Lenis  Patient Status: [facility] - In-pt  Subjective: S/p cerebral angiogram with recanalization of the Right M1/MCA likely related to tPA administration with distal embolization to a proximal M3/MCA posterior division branch. Mechanical thrombectomy performed with direct contact aspiration resulting in complete recanalization after 1 pass (TICI 3). No thromboembolic or hemorrhagic complication.  Pt seen in Neuro ICU. Awake and conversant. No family at bedside  Objective: Physical Exam: BP 106/72   Pulse 88   Temp 98.1 F (36.7 C) (Oral)   Resp (!) 27   SpO2 94%  A&O x 3 Conversant, speech clear. Denies HA, N/V Moving all 4 extrems. Face symmetric, tongue midline. No drift UE strength equal LLE slightly weaker than right. (R)groin soft, NT Palpable pedal pulses.   Current Facility-Administered Medications:    [START ON 07/12/2022]  stroke: early stages of recovery book, , Does not apply, Once, Caryl Pina, MD   0.9 %  sodium chloride infusion, , Intravenous, Continuous, Caryl Pina, MD, Last Rate: 50 mL/hr at 07/11/22 0700, Infusion Verify at 07/11/22 0700   Chlorhexidine Gluconate Cloth 2 % PADS 6 each, 6 each, Topical, Daily, Caryl Pina, MD, 6 each at 07/11/22 [phone]   Oral care mouth rinse, 15 mL, Mouth Rinse, PRN, Caryl Pina, MD   perflutren lipid microspheres (DEFINITY) IV suspension, 1-10 mL, Intravenous, PRN, Caryl Pina, MD, 2 mL at 07/11/22 0837   senna-docusate (Senokot-S) tablet 1 tablet, 1 tablet, Oral, QHS PRN, Caryl Pina, MD   sodium chloride 0.9 % bolus 250 mL, 250 mL, Intravenous, PRN, de Melchor Amour, Jerilynn Mages, MD, Last Rate: 50 mL/hr at 07/11/22 0700, Infusion Verify at 07/11/22 0700  Labs: CBC Recent Labs    07/11/22 0209 07/11/22 0318  WBC 9.9  --   HGB 11.3* 11.9*  HCT 35.3* 35.0*  PLT  163  --    BMET Recent Labs    07/11/22 0209 07/11/22 0318  NA 136 138  K 4.2 4.4  CL 97*  --   CO2 27  --   GLUCOSE 125*  --   BUN 27*  --   CREATININE 1.11  --   CALCIUM 8.7*  --    LFT Recent Labs    07/11/22 0209  PROT 6.1*  ALBUMIN 3.5  AST 25  ALT 25  ALKPHOS 79  BILITOT 2.4*   PT/INR Recent Labs    07/11/22 0209  LABPROT 15.9*  INR 1.3*     Studies/Results: No results found.  Assessment/Plan: CVA S/p angiogram with recanalization of the Right M1/MCA likely related to tPA administration with distal embolization to a proximal M3/MCA posterior division branch. Mechanical thrombectomy performed with direct contact aspiration resulting in complete recanalization after 1 pass (TICI 3). No thromboembolic or hemorrhagic complication.  Doing well. For MR later today. Plan per Neuro    LOS: 1 day   I spent a total of 15 minutes in face to face in clinical consultation, greater than 50% of which was counseling/coordinating care for CVA post intervention  Brayton El PA-C 07/11/2022 9:50 AM

## 2022-07-11 NOTE — Progress Notes (Addendum)
STROKE TEAM PROGRESS NOTE   BRIEF HPI [name] is a 57 y.o. male with history of CAD, systolic heart failure secondary to possible cardiac sarcoidosis, wide complex tachycardia, ICD implant, HLD and HTN who presented to [facility] this evening with acute onset of left sided weakness. [name] was 7:30 PM. BP prior to arrival was in the low range at 104/56 per EMS. CT head was negative for acute abnormality with ASPECTS of 10. Teleneurology was consulted and he was deemed to be a tPA candidate, with tPA infusion started at [facility]. CTA revealed an acute right M1 occlusion and he was emergently transferred to [facility] for a mechanical thrombectomy.   SIGNIFICANT HOSPITAL EVENTS 6/24 - Mechanical thrombectomy  6/25- CT at 2000  INTERM HISTORY/SUBJECTIVE Extubated in IR, swallow screen today.  Needs pacemaker interrogation Cardiology consulted for assistance with BP and HF medications/medical optimization Neurologically stable, hypotensive this morning. He receive a bolus over night for hypotension. IVF stopped today.   OBJECTIVE  CBC    Component Value Date/Time   WBC 9.9 07/11/2022 0209   RBC 3.38 (L) 07/11/2022 0209   HGB 11.9 (L) 07/11/2022 0318   HGB 14.3 05/31/2021 0906   HCT 35.0 (L) 07/11/2022 0318   HCT 42.6 05/31/2021 0906   PLT 163 07/11/2022 0209   PLT 203 05/31/2021 0906   MCV 104.4 (H) 07/11/2022 0209   MCV 98 (H) 05/31/2021 0906   MCH 33.4 07/11/2022 0209   MCHC 32.0 07/11/2022 0209   RDW 14.1 07/11/2022 0209   RDW 15.8 (H) 05/31/2021 0906   LYMPHSABS 0.9 05/31/2021 0906   MONOABS 0.8 02/03/2020 1520   EOSABS 0.1 05/31/2021 0906   BASOSABS 0.0 05/31/2021 0906    BMET    Component Value Date/Time   NA 138 07/11/2022 0318   NA 143 05/31/2021 0906   K 4.4 07/11/2022 0318   CL 97 (L) 07/11/2022 0209   CO2 27 07/11/2022 0209   GLUCOSE 125 (H) 07/11/2022 0209   BUN 27 (H) 07/11/2022 0209   BUN 12 05/31/2021 0906   CREATININE 1.11 07/11/2022 0209   CALCIUM 8.7 (L)  07/11/2022 0209   EGFR 80 05/31/2021 0906   GFRNONAA >60 07/11/2022 0209    IMAGING past 24 hours No results found.  Vitals:   07/11/22 0630 07/11/22 0700 07/11/22 0723 07/11/22 0800  BP: 94/71 (!) 89/67  100/78  Pulse: 65 (!) 58 67 75  Resp: (!) 23 12 (!) 23 19  Temp:      TempSrc:      SpO2: 96% 96% 95% 95%     PHYSICAL EXAM General:  Alert, well-nourished, well-developed patient in no acute distress Psych:  Mood and affect appropriate for situation CV: Regular rate and rhythm on monitor Respiratory:  Regular, unlabored respirations on room air GI: Abdomen soft and nontender   NEURO:  Mental Status: AA&Ox3, patient is able to give clear and coherent history Speech/Language: speech is without dysarthria or aphasia.  Naming, repetition, fluency, and comprehension intact.  Cranial Nerves:  II: PERRL. Visual fields full.  III, IV, VI: EOMI. Eyelids elevate symmetrically.  V: Sensation is intact to light touch and symmetrical to face.  VII: Face is symmetrical resting and smiling VIII: hearing intact to voice. IX, X: Palate elevates symmetrically. Phonation is normal.  HY:QMVHQION shrug 5/5. XII: tongue is midline without fasciculations. Motor: 5/5 strength to all muscle groups tested.  Tone: is normal and bulk is normal Sensation- Intact to light touch bilaterally. Extinction absent to  light touch to DSS.   Coordination: FTN intact bilaterally- mild asterixis bilaterally, HKS: no ataxia in BLE.No drift.  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 s/p tPA and IR with TICI3, likely due to cardiomyopathy with low EF Code Stroke CT head no acute abnormality  CTA head & neck right distal M1 occlusion  IR with proximal right M3 occlusion and TICI3 perfusion Post IR CT no hemorrhage Repeat CT pending tonight MRI  pending tomorrow 2D Echo <20% ICD interrogation to rule out afib LDL 59 HgbA1c 5.1 VTE prophylaxis  - SCDs aspirin 81 mg daily prior to admission, now on No antithrombotic for 24 hours post tPa - will likely need AC given stroke and low EF Therapy recommendations:  pending  Disposition:  pending   Chronic Systolic Heart Failure Follows with HF team as outpt Current echo showed EF < 20% HF team consulted Put on digoxin GDMT on hold given soft BP  Hx of hypertension Hypotension One time dose of lasix per cardiology  GDMT on hold  BP 90-100s Close monitoring  Cardiac Sarcoidosis Hx of VT Continue mexilitene ICD interrogation pending  CAD Home meds: Coreg 3.125, Lasix 40mg , Lostartan 25mg , Aldactone 12.5, Farxiga 10mg , mexilitene Hypotensive Cardiology consulted Follows with Dr. Gala Romney who saw him inpatient as well - recommends oral anticoagulation  - Cardiac MRI - Digoxin 0.158mcg - Hold SGLT2i until closer to discharge  Hyperlipidemia Home meds:  Atorvastatin 80mg  LDL 59, goal < 70 On lipitor 80 Continue statin at discharge  Diabetes type II Controlled Home meds:  Farxiga HgbA1c 5.1, goal < 7.0 CBGs SSI Recommend close follow-up with PCP for better DM control  Other Stroke Risk Factors Obstructive sleep apnea CO2 retention, used bipap overnight inpatient Need outpt sleep study done   Hospital day # 1  Patient seen and examined by NP/APP with MD. MD to update note as needed.   Elmer Picker, DNP, FNP-BC [facility] Pager: [phone]  ATTENDING NOTE: I reviewed above note and agree with the assessment and plan. Pt was seen and examined.   No family at the bedside. Pt doing well lying in bed, AAO x 3, no aphasia, following all simple commands. Moving all extremities equally except mild b/l UE asterixis. Pending MRI and MRA but likely to be done tomorrow, will need to repeat CT tonight at 24 h of tPA. Will need ICD interrogation to rule out afib. Passed swallow, d/c IVF.  EF < 20%, follows with HF team as outpt, will consult them this  admisison. Pt still has soft BP likely due to CHF. Pt stroke likely due to low EF, will consider AC soon. Appreciate cardiology assistance.   For detailed assessment and plan, please refer to above/below as I have made changes wherever appropriate.   Marvel Plan, MD PhD Stroke Neurology 07/11/2022 6:10 PM  This patient is critically ill due to stroke with right M1 occlusion s/p TPA and IR, CHF, low EF, hypotension and at significant risk of neurological worsening, death form recurrent stroke, hemorrhagic conversion, bleeding from TPA, heart failure, shock. This patient's care requires constant monitoring of vital signs, hemodynamics, respiratory and cardiac monitoring, review of multiple databases, neurological assessment, discussion with family, other specialists and medical decision making of high complexity. I spent 35 minutes of neurocritical care time in the care of this patient.    To contact Stroke Continuity provider, please refer to [email or web address]. After hours, contact General Neurology

## 2022-07-11 NOTE — Progress Notes (Signed)
Patient arrived to 4NICU with clothes and hat that he wore prior to admission at beside. Family at beside, aware of no phone or wallet upon arrival.

## 2022-07-11 NOTE — Progress Notes (Signed)
Placed patient on bipap due to arterial blood gas results.  

## 2022-07-11 NOTE — Progress Notes (Signed)
eLink Physician-Brief Progress Note Patient Name: Charles Holmes DOB: 1965-11-04 MRN: [id number]   Date of Service  07/11/2022  HPI/Events of Note  56/M with CAD, systolic CHD, presenting with acute onset left sided weakness.  Pt was given TPA a per tele neurology recommendations.  CTA revealed an acute right M1 occlusion.  Pt is s/p mechanical thrombectomy to the right M3/MCA occlusion.   BP 117/66, HR 81, RR 15, O2 sats 97%.   eICU Interventions  Get ABG. Check post-op labs.  Goal of normotension.  SCDs for DVT prophylaxis.      Intervention Category Evaluation Type: New Patient Evaluation  Larinda Buttery 07/11/2022, 12:24 AM

## 2022-07-11 NOTE — Progress Notes (Addendum)
BP in 110s, 500 cc bolus given per MD Lindzen. No improvement. Per MD Lindzen okay with BP goal 110-160s.

## 2022-07-11 NOTE — Evaluation (Signed)
Physical Therapy Evaluation Patient Details Name: Charles Holmes MRN: [id number] DOB: 1965/10/16 Today's Date: 07/11/2022  History of Present Illness  57 yo male s/p trombectomy R MCA PMH CAD, CHF, HLD, HTN, systolic HF, ICD implant, R & L heart cath  Clinical Impression  Patient presents with decreased mobility due to decreased balance and mild L symptoms.  Previously independent living alone taking care of his cat, currently needing min A for ambulation due to mild L side veering in hallway and imbalance.  Feel he will benefit from skilled PT in the acute setting.  He may benefit from follow up outpatient PT at d/c.         Recommendations for follow up therapy are one component of a multi-disciplinary discharge planning process, led by the attending physician.  Recommendations may be updated based on patient status, additional functional criteria and insurance authorization.  Follow Up Recommendations       Assistance Recommended at Discharge Intermittent Supervision/Assistance  Patient can return home with the following  A little help with walking and/or transfers;A little help with bathing/dressing/bathroom;Help with stairs or ramp for entrance;Assistance with cooking/housework;Assist for transportation;Direct supervision/assist for medications management    Equipment Recommendations None recommended by PT  Recommendations for Other Services       Functional Status Assessment Patient has had a recent decline in their functional status and demonstrates the ability to make significant improvements in function in a reasonable and predictable amount of time.     Precautions / Restrictions Precautions Precautions: Fall      Mobility  Bed Mobility Overal bed mobility: Modified Independent                  Transfers Overall transfer level: Needs assistance   Transfers: Sit to/from Stand Sit to Stand: Min guard           General transfer comment: assist for  balance, safety, lines    Ambulation/Gait Ambulation/Gait assistance: Min guard Gait Distance (Feet): 150 Feet Assistive device: None Gait Pattern/deviations: Step-through pattern, Drifts right/left, Decreased stride length       General Gait Details: mild L drift with minguard for safety  Stairs            Wheelchair Mobility    Modified Rankin (Stroke Patients Only) Modified Rankin (Stroke Patients Only) Pre-Morbid Rankin Score: No symptoms Modified Rankin: Moderately severe disability     Balance Overall balance assessment: Needs assistance   Sitting balance-Leahy Scale: Good Sitting balance - Comments: putting on socks on EOB     Standing balance-Leahy Scale: Good Standing balance comment: no UE support for static balance mild L veering with ambulation                             Pertinent Vitals/Pain Pain Assessment Pain Assessment: No/denies pain    Home Living Family/patient expects to be discharged to:: Private residence Living Arrangements: Alone Available Help at Discharge: Available PRN/intermittently Type of Home: Mobile home Home Access: Stairs to enter Entrance Stairs-Rails: Doctor, general practice of Steps: 2   Home Layout: One level Home Equipment: None Additional Comments: has a large male orange cat named Mustard    Prior Function Prior Level of Function : Independent/Modified Independent;Driving;Other (comment)               ADLs Comments: does cooking / cleaning     Hand Dominance   Dominant Hand: Right    Extremity/Trunk Assessment  Upper Extremity Assessment Upper Extremity Assessment: Defer to OT evaluation LUE Deficits / Details: reports not sensation changes however pt with blue block at this time s/p procedure. Ot to further assess hand when allowed more access    Lower Extremity Assessment Lower Extremity Assessment: Generalized weakness    Cervical / Trunk Assessment Cervical /  Trunk Assessment: Normal  Communication   Communication: No difficulties  Cognition Arousal/Alertness: Awake/alert Behavior During Therapy: WFL for tasks assessed/performed Overall Cognitive Status: Within Functional Limits for tasks assessed                                          General Comments General comments (skin integrity, edema, etc.): VSS, kept on 2L O2 due to drop to 87% on RA; daughter and son in law in the room and supportive; report pt's sister also here with a stroke    Exercises     Assessment/Plan    PT Assessment Patient needs continued PT services  PT Problem List Decreased balance;Decreased mobility;Decreased activity tolerance;Cardiopulmonary status limiting activity       PT Treatment Interventions DME instruction;Functional mobility training;Balance training;Patient/family education;Gait training;Stair training;Therapeutic exercise;Neuromuscular re-education    PT Goals (Current goals can be found in the Care Plan section)  Acute Rehab PT Goals Patient Stated Goal: to return to independent PT Goal Formulation: With patient/family Time For Goal Achievement: 07/25/22 Potential to Achieve Goals: Good    Frequency Min 4X/week     Co-evaluation PT/OT/SLP Co-Evaluation/Treatment: Yes Reason for Co-Treatment: For patient/therapist safety;To address functional/ADL transfers PT goals addressed during session: Mobility/safety with mobility;Balance OT goals addressed during session: ADL's and self-care       AM-PAC PT "6 Clicks" Mobility  Outcome Measure Help needed turning from your back to your side while in a flat bed without using bedrails?: A Little Help needed moving from lying on your back to sitting on the side of a flat bed without using bedrails?: A Little Help needed moving to and from a bed to a chair (including a wheelchair)?: A Little Help needed standing up from a chair using your arms (e.g., wheelchair or bedside chair)?:  A Little Help needed to walk in hospital room?: A Little Help needed climbing 3-5 steps with a railing? : Total 6 Click Score: 16    End of Session Equipment Utilized During Treatment: Gait belt;Oxygen Activity Tolerance: Patient tolerated treatment well Patient left: in bed;with call bell/phone within reach;with family/visitor present Nurse Communication: Mobility status PT Visit Diagnosis: Other abnormalities of gait and mobility (R26.89)    Time: 2536-6440 PT Time Calculation (min) (ACUTE ONLY): 29 min   Charges:   PT Evaluation $PT Eval Moderate Complexity: 1 Mod          Cyndi Carston Riedl, PT Acute Rehabilitation Services Office:[phone] 07/11/2022   [name] 07/11/2022, 5:35 PM

## 2022-07-11 NOTE — Transfer of Care (Signed)
Immediate Anesthesia Transfer of Care Note  Patient: Charles Holmes  Procedure(s) Performed: RADIOLOGY WITH ANESTHESIA  Patient Location: ICU  Anesthesia Type:General  Level of Consciousness: drowsy  Airway & Oxygen Therapy: Patient Spontanous Breathing and Patient connected to face mask oxygen  Post-op Assessment: Report given to RN, Post -op Vital signs reviewed and stable, Patient moving all extremities X 4, and Patient able to stick tongue midline  Post vital signs: Reviewed and stable  Last Vitals:  Vitals Value Taken Time  BP 120/74 07/11/22 0013  Temp    Pulse 85 07/11/22 0013  Resp 15 07/11/22 0013  SpO2 97 % 07/11/22 0013  Vitals shown include unvalidated device data.  Last Pain: There were no vitals filed for this visit.       Complications: No notable events documented.

## 2022-07-11 NOTE — Progress Notes (Signed)
Okay to remove A-line per Dr. Amada Jupiter.

## 2022-07-11 NOTE — Progress Notes (Signed)
Orthopedic Tech Progress Note Patient Details:  Charles Holmes [birth year] [id number]  RN stated patient has KNEE IMMOBILIZER   Patient ID: Charles Holmes, male   DOB: 1965-11-05, 57 y.o.   MRN: [id number]  [name] 07/11/2022, 9:53 AM

## 2022-07-11 NOTE — Consult Note (Addendum)
Advanced Heart Failure Team Consult Note   Primary Physician: [name], PA-C PCP-Cardiologist:  Norman Herrlich, MD  Reason for Consultation: Acute CVA, hx cardiac sarcoid/ HFrEF  HPI:   Charles Holmes is seen today for evaluation of acute CVA, hx cardiac sarcoid / HFrEF at the request of Dr. Otelia Limes, neurology.   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   Admitted to [facility] 11/21 with CP. Found to have VT and EF 25-30% mild AI, Charles and TR. Transferred to [facility]. Cath showed 70% prox LAD with no intervention.  He was discharged with Life Vest.    Presented to [facility] Clinic 02/03/19 with recurrent VT. Amio started. Referred to [facility] for PET. PET @ [facility] 2/22. EF 25% with active inflammation c/w sarcoid.   Re-admitted  02/2020 with recurrent VT and LifeVest alarms. + AKI. Started prednisone and PJP prophylaxis. ICD implanted. Amio switched due to mexilitene due to severe tremor  S/p CRT-D upgrade 06/08/21.  Charles Holmes presented to [facility] with acute onset of L sided weakness. LKW 1930. Was at home taking out his trash when he felt his legs all of a sudden give out. CT head was negative for acute abnormality. Received TPA at [facility]. CTA revealed an acute right M1 occlusion and he was emergently transferred to [facility] for a mechanical thrombectomy which occurred> 6/24. Further workup showed Echo 07/10/22: EF <20%, no RWMA, normal RV, mod pericardial effusion, mod Charles/TR. BNP 1600, K 4.2, SCr 1.11, LDL 59, HgbA1c 5.1.   Had been stable at home only dealing with fluid build up for the past month (reviewed device interrogation from 5/24, Optivol high). Had been out of his own PRN lasix and for the past 2 weeks started taking his sisters 20 mg daily. Noticed his weight going up, no weight today. For the past month he has progressively felt more fatigued and easily SOB with ADLs. Would get SOB after taking a shower. Took his lasix in the evenings so that  he would take up feeling ok, if he didn't take his lasix in the evening he would wake up feeling bad. Has been out of farxiga for months because of cost. Appetite has been ok, not drinking large amounts of fluid intake. Noticed bilateral ankle swelling and belly fullness over the past 2 weeks. [facility]   Stable in bed eating breakfast. Denies CP.    Cardiac studies reviewed:  - Echo 11/07/21: EF 20-25% RV mild to moderately reduced.  - CPX 8/22 FVC 1.37 (30%)      FEV1 0.65 (18%)        FEV1/FVC 48 (60%)        MVV 23 (16%)      (Unable to perform PFTs due to poor effort and hypoventilation) Resting HR: 71 Standing HR: 71 Peak HR: 130   (80% age predicted max HR)  BP rest: 98/66 Standing BP: 100/64 BP peak: 112/58  Peak VO2: 16.1 (49% predicted peak VO2)  VE/VCO2 slope:  19  OUES: 1.82  Peak RER: 1.17  PETCO2 at peak:  59  - Echo 08/26/20 EF < 20% severely dilated LV RV mildly decreased.  - PET @ [facility] 2/22. EF 25% with active inflammation c/w sarcoid. - cMRI 12/19/19 EF 18% diffuse LGE suggestive of sarcoid. RV 38%  Home Medications Prior to Admission medications   Medication Sig Start Date End  Date Taking? Authorizing Provider  aspirin EC 81 MG EC tablet Take 1 tablet (81 mg total) by mouth daily. 12/19/19   [place], Peter M, MD  atorvastatin (LIPITOR) 80 MG tablet TAKE 1 TABLET BY MOUTH EVERY DAY 03/23/22   Dejai Schubach, Bevelyn Buckles, MD  carvedilol (COREG) 3.125 MG tablet TAKE 1 TABLET BY MOUTH TWICE A DAY 06/16/22   Maayan Jenning, Bevelyn Buckles, MD  dapagliflozin propanediol (FARXIGA) 10 MG TABS tablet Take 1 tablet (10 mg total) by mouth daily before breakfast. 11/07/21   Adeeb Konecny, Bevelyn Buckles, MD  dorzolamide-timolol (COSOPT) 22.3-6.8 MG/ML ophthalmic solution Place 1 drop into both eyes in the morning and at bedtime. 04/29/20   [provider]  fluticasone-salmeterol (ADVAIR HFA) 230-21 MCG/ACT inhaler Inhale 2 puffs into the lungs 2 (two) times daily.  12/13/20   Tomma Lightning, MD  folic acid (FOLVITE) 1 MG tablet TAKE 1 TABLET BY MOUTH EVERY DAY 06/16/22   Teven Mittman, Bevelyn Buckles, MD  furosemide (LASIX) 40 MG tablet Take 40 mg by mouth daily as needed for fluid.    [provider]  latanoprost (XALATAN) 0.005 % ophthalmic solution Place 1 drop into both eyes at bedtime.    [provider]  losartan (COZAAR) 25 MG tablet TAKE 1 TABLET BY MOUTH IN THE MORNING AND AT BEDTIME 03/14/22   Daveigh Batty, Bevelyn Buckles, MD  methotrexate (RHEUMATREX) 2.5 MG tablet Take 8 tablets (20 mg total) by mouth once a week. NEEDS FOLLOW UP APPOINTMENT FOR MORE REFILLS 07/06/22   Cherryl Babin, Bevelyn Buckles, MD  mexiletine (MEXITIL) 250 MG capsule Take 1 capsule (250 mg total) by mouth 2 (two) times daily. 11/07/21   Camnitz, Andree Coss, MD  Multiple Vitamins-Minerals (MULTIVITAMIN WITH MINERALS) tablet Take 1 tablet by mouth daily. Patient not taking: Reported on 11/07/2021    [provider]  pantoprazole (PROTONIX) 40 MG tablet Take 40 mg by mouth daily before breakfast.    [provider]  spironolactone (ALDACTONE) 25 MG tablet TAKE 1/2 TABLET BY MOUTH DAILY 06/16/22   Aysa Larivee, Bevelyn Buckles, MD    Past Medical History: Past Medical History:  Diagnosis Date   CAD (coronary artery disease)    CHF (congestive heart failure) (HCC)    GERD (gastroesophageal reflux disease)    Hyperlipidemia    Hypertension    Systolic heart failure (HCC) 2021   LVEF 18%, RVEF 38% on cardiac MRI 12/19/2019. possible cardiac sarcoidosis.   Wide-complex tachycardia 2021   wears LifeVest    Past Surgical History: Past Surgical History:  Procedure Laterality Date   BIV UPGRADE N/A 06/07/2021   Procedure: BIV ICD UPGRADE;  Surgeon: Regan Lemming, MD;  Location: [facility] INVASIVE CV LAB;  Service: Cardiovascular;  Laterality: N/A;   ICD IMPLANT N/A 02/20/2020   Procedure: ICD IMPLANT;  Surgeon: Regan Lemming, MD;  Location: [facility] INVASIVE CV LAB;  Service:  Cardiovascular;  Laterality: N/A;   IR CT HEAD LTD  07/10/2022   IR PERCUTANEOUS ART THROMBECTOMY/INFUSION INTRACRANIAL INC DIAG ANGIO  07/10/2022   IR US GUIDE VASC ACCESS RIGHT  07/10/2022   RADIOLOGY WITH ANESTHESIA N/A 07/10/2022   Procedure: RADIOLOGY WITH ANESTHESIA;  Surgeon: Radiologist, Medication, MD;  Location: [facility] OR;  Service: Radiology;  Laterality: N/A;   RIGHT/LEFT HEART CATH AND CORONARY ANGIOGRAPHY N/A 12/16/2019   Procedure: RIGHT/LEFT HEART CATH AND CORONARY ANGIOGRAPHY;  Surgeon: [place]il[place], Peter M, MD;  Location: [facility] INVASIVE CV LAB;  Service: Cardiovascular;  Laterality: N/A;    Family History: Family History  Problem  Relation Age of Onset   Hypertension Father    Heart disease Father    Heart disease Mother     Social History: Social History   Socioeconomic History   Marital status: Divorced    Spouse name: Not on file   Number of children: Not on file   Years of education: Not on file   Highest education level: Not on file  Occupational History   Not on file  Tobacco Use   Smoking status: Former    Types: Cigarettes   Smokeless tobacco: Never  Vaping Use   Vaping Use: Unknown  Substance and Sexual Activity   Alcohol use: Yes    Alcohol/week: 6.0 standard drinks of alcohol    Types: 6 Cans of beer per week   Drug use: Yes    Types: Marijuana    Comment: stopped using months ago d/t how it affected breathing   Sexual activity: Yes  Other Topics Concern   Not on file  Social History Narrative   Not on file   Social Determinants of Health   Financial Resource Strain: Not on file  Food Insecurity: Not on file  Transportation Needs: Not on file  Physical Activity: Not on file  Stress: Not on file  Social Connections: Not on file    Allergies:  Allergies  Allergen Reactions   Percocet [Oxycodone-Acetaminophen] Itching   Objective:    Vital Signs:   Temp:  [98.1 F (36.7 C)] 98.1 F (36.7 C) (06/25 0400) Pulse Rate:  [58-91] 88 (06/25  0900) Resp:  [11-27] 27 (06/25 0900) BP: (89-113)/(67-84) 106/72 (06/25 0900) SpO2:  [93 %-98 %] 94 % (06/25 0900) Arterial Line BP: (92-133)/(45-70) 119/69 (06/25 0900) FiO2 (%):  [40 %] 40 % (06/25 0346)   Weight change: There were no vitals filed for this visit.  Intake/Output:   Intake/Output Summary (Last 24 hours) at 07/11/2022 1050 Last data filed at 07/11/2022 0700 Gross per 24 hour  Intake 1320.61 ml  Output 460 ml  Net 860.61 ml    Physical Exam  General:  well appearing.  No respiratory difficulty HEENT: normal Neck: supple. JVD to ear. Carotids 2+ bilat; no bruits. No lymphadenopathy or thyromegaly appreciated. Cor: PMI nondisplaced. Regular rate & rhythm. No rubs, gallops or murmurs. Lungs: clear Abdomen: soft, nontender, slightly distended. No hepatosplenomegaly. No bruits or masses. Good bowel sounds. Extremities: no cyanosis, clubbing, rash, trace BLE edema. L wrist a line Neuro: alert & oriented x 3, cranial nerves grossly intact. moves all 4 extremities w/o difficulty. Affect pleasant.   Telemetry   NSR 80s with PVCs (Personally reviewed)    EKG    EKG reviewed from 10/23 a sensed v paced 61 bpm, will update  Labs   Basic Metabolic Panel: Recent Labs  Lab 07/11/22 0209 07/11/22 0318  NA 136 138  K 4.2 4.4  CL 97*  --   CO2 27  --   GLUCOSE 125*  --   BUN 27*  --   CREATININE 1.11  --   CALCIUM 8.7*  --     Liver Function Tests: Recent Labs  Lab 07/11/22 0209  AST 25  ALT 25  ALKPHOS 79  BILITOT 2.4*  PROT 6.1*  ALBUMIN 3.5   No results for input(s): "LIPASE", "AMYLASE" in the last 168 hours. No results for input(s): "AMMONIA" in the last 168 hours.  CBC: Recent Labs  Lab 07/11/22 0209 07/11/22 0318  WBC 9.9  --   HGB 11.3* 11.9*  HCT  35.3* 35.0*  MCV 104.4*  --   PLT 163  --    Cardiac Enzymes: No results for input(s): "CKTOTAL", "CKMB", "CKMBINDEX", "TROPONINI" in the last 168 hours.  BNP: BNP (last 3 results) Recent  Labs    11/07/21 1457 07/11/22 0209  BNP 284.0* 1,588.2*   ProBNP (last 3 results) No results for input(s): "PROBNP" in the last 8760 hours.  CBG: No results for input(s): "GLUCAP" in the last 168 hours.  Coagulation Studies: Recent Labs    07/11/22 0209  LABPROT 15.9*  INR 1.3*   Imaging   IR PERCUTANEOUS ART THROMBECTOMY/INFUSION INTRACRANIAL INC DIAG ANGIO  Result Date: 07/11/2022 INDICATION: 57 year old male presented to outside hospital with sudden onset left-sided weakness and facial droop; NIHSS 6. His last known well was 7:30 p.m. on 07/10/2022. His past medical history significant for CAD, systolic heart failure secondary to possible cardiac sarcoidosis, wide complex tachycardia, ICD implant, HLD and HTN; baseline modified Rankin scale 0. Head CT showed no evidence of an acute infarct or hemorrhage (ASPECT 10). He received IV tPA. CT angiogram of the head and neck showed a right M1/MCA occlusion. He was then transferred to [facility] for mechanical thrombectomy. EXAM: ULTRASOUND-GUIDED VASCULAR ACCESS DIAGNOSTIC CEREBRAL ANGIOGRAM MECHANICAL THROMBECTOMY FLAT PANEL HEAD CT COMPARISON:  CT/CT angiogram of the head and neck July 10, 2022. MEDICATIONS: No antibiotics administered. ANESTHESIA/SEDATION: The procedure was performed under general anesthesia. CONTRAST:  40 mL of Omnipaque 300 milligram/mL. FLUOROSCOPY: Radiation Exposure Index (as provided by the fluoroscopic device): 365 mGy Kerma COMPLICATIONS: None immediate. TECHNIQUE: Informed verbal consent was obtained from the patient's daughter via telephone after a thorough discussion of the procedural risks, benefits and alternatives. All questions were addressed. Maximal Sterile Barrier Technique was utilized including caps, mask, sterile gowns, sterile gloves, sterile drape, hand hygiene and skin antiseptic. A timeout was performed prior to the initiation of the procedure. The right groin was prepped and draped in the usual  sterile fashion. Using a micropuncture kit and the modified Seldinger technique, access was gained to the right common femoral artery and an 8 French sheath was placed. Real-time ultrasound guidance was utilized for vascular access including the acquisition of a permanent ultrasound image documenting patency of the accessed vessel. Under fluoroscopy, a Zoom 88 guide catheter was navigated over a 6 [place] Berenstein 2 catheter and a 0.035" Terumo Glidewire into the aortic arch. The catheter was placed into the right common carotid artery and then advanced into the right internal carotid artery. The diagnostic catheter was removed. Frontal and lateral angiograms of the head were obtained. FINDINGS: 1. Normal caliber of the right common femoral artery, adequate for vascular access. 2. Interval recanalization of the right M1/MCA segment with embolization to the proximal right M3/MCA posterior division branch. 3. Normal flow to the right ACA vascular tree. PROCEDURE: Using biplane roadmap guidance, a Red 62 aspiration catheter was navigated over Colossus 35 microguidewire into the cavernous segment of the right ICA. The aspiration catheter was then advanced to the level of occlusion in the M3 segment and connected to an aspiration pump. Continuous aspiration was performed for 1.5 minutes. The guide catheter was connected to a VacLok syringe. The aspiration catheter was subsequently removed under constant aspiration. The guide catheter was aspirated for debris. Right internal carotid artery angiograms with frontal and lateral views of the head review complete recanalization of the right MCA vascular tree (TICI 3). Flat panel CT of the head was obtained and post processed in a separate workstation with  concurrent attending physician supervision. Selected images were sent to PACS. No evidence of hemorrhagic complication. The catheter was subsequently withdrawn. Right common femoral artery angiogram was obtained in right  anterior oblique view. The puncture is at the level of the common femoral artery. The artery has normal caliber, adequate for closure device. The sheath was exchanged over the wire for a Perclose prostyle which was utilized for access closure. Immediate hemostasis was achieved. IMPRESSION: 1. Successful mechanical thrombectomy for treatment of a right M3/MCA occlusion achieving complete recanalization after 1 direct contact aspiration pass (TICI 3). 2. No thromboembolic or hemorrhagic complication. PLAN: Transfer to ICU for observation. Electronically Signed   By: Baldemar Lenis M.D.   On: 07/11/2022 10:40   IR CT Head Ltd  Result Date: 07/11/2022 INDICATION: 57 year old male presented to outside hospital with sudden onset left-sided weakness and facial droop; NIHSS 6. His last known well was 7:30 p.m. on 07/10/2022. His past medical history significant for CAD, systolic heart failure secondary to possible cardiac sarcoidosis, wide complex tachycardia, ICD implant, HLD and HTN; baseline modified Rankin scale 0. Head CT showed no evidence of an acute infarct or hemorrhage (ASPECT 10). He received IV tPA. CT angiogram of the head and neck showed a right M1/MCA occlusion. He was then transferred to [facility] for mechanical thrombectomy. EXAM: ULTRASOUND-GUIDED VASCULAR ACCESS DIAGNOSTIC CEREBRAL ANGIOGRAM MECHANICAL THROMBECTOMY FLAT PANEL HEAD CT COMPARISON:  CT/CT angiogram of the head and neck July 10, 2022. MEDICATIONS: No antibiotics administered. ANESTHESIA/SEDATION: The procedure was performed under general anesthesia. CONTRAST:  40 mL of Omnipaque 300 milligram/mL. FLUOROSCOPY: Radiation Exposure Index (as provided by the fluoroscopic device): 365 mGy Kerma COMPLICATIONS: None immediate. TECHNIQUE: Informed verbal consent was obtained from the patient's daughter via telephone after a thorough discussion of the procedural risks, benefits and alternatives. All questions were addressed. Maximal  Sterile Barrier Technique was utilized including caps, mask, sterile gowns, sterile gloves, sterile drape, hand hygiene and skin antiseptic. A timeout was performed prior to the initiation of the procedure. The right groin was prepped and draped in the usual sterile fashion. Using a micropuncture kit and the modified Seldinger technique, access was gained to the right common femoral artery and an 8 French sheath was placed. Real-time ultrasound guidance was utilized for vascular access including the acquisition of a permanent ultrasound image documenting patency of the accessed vessel. Under fluoroscopy, a Zoom 88 guide catheter was navigated over a 6 [place] Berenstein 2 catheter and a 0.035" Terumo Glidewire into the aortic arch. The catheter was placed into the right common carotid artery and then advanced into the right internal carotid artery. The diagnostic catheter was removed. Frontal and lateral angiograms of the head were obtained. FINDINGS: 1. Normal caliber of the right common femoral artery, adequate for vascular access. 2. Interval recanalization of the right M1/MCA segment with embolization to the proximal right M3/MCA posterior division branch. 3. Normal flow to the right ACA vascular tree. PROCEDURE: Using biplane roadmap guidance, a Red 62 aspiration catheter was navigated over Colossus 35 microguidewire into the cavernous segment of the right ICA. The aspiration catheter was then advanced to the level of occlusion in the M3 segment and connected to an aspiration pump. Continuous aspiration was performed for 1.5 minutes. The guide catheter was connected to a VacLok syringe. The aspiration catheter was subsequently removed under constant aspiration. The guide catheter was aspirated for debris. Right internal carotid artery angiograms with frontal and lateral views of the head review complete recanalization of  the right MCA vascular tree (TICI 3). Flat panel CT of the head was obtained and post  processed in a separate workstation with concurrent attending physician supervision. Selected images were sent to PACS. No evidence of hemorrhagic complication. The catheter was subsequently withdrawn. Right common femoral artery angiogram was obtained in right anterior oblique view. The puncture is at the level of the common femoral artery. The artery has normal caliber, adequate for closure device. The sheath was exchanged over the wire for a Perclose prostyle which was utilized for access closure. Immediate hemostasis was achieved. IMPRESSION: 1. Successful mechanical thrombectomy for treatment of a right M3/MCA occlusion achieving complete recanalization after 1 direct contact aspiration pass (TICI 3). 2. No thromboembolic or hemorrhagic complication. PLAN: Transfer to ICU for observation. Electronically Signed   By: Baldemar Lenis M.D.   On: 07/11/2022 10:40   IR US Guide Vasc Access Right  Result Date: 07/11/2022 INDICATION: 57 year old male presented to outside hospital with sudden onset left-sided weakness and facial droop; NIHSS 6. His last known well was 7:30 p.m. on 07/10/2022. His past medical history significant for CAD, systolic heart failure secondary to possible cardiac sarcoidosis, wide complex tachycardia, ICD implant, HLD and HTN; baseline modified Rankin scale 0. Head CT showed no evidence of an acute infarct or hemorrhage (ASPECT 10). He received IV tPA. CT angiogram of the head and neck showed a right M1/MCA occlusion. He was then transferred to [facility] for mechanical thrombectomy. EXAM: ULTRASOUND-GUIDED VASCULAR ACCESS DIAGNOSTIC CEREBRAL ANGIOGRAM MECHANICAL THROMBECTOMY FLAT PANEL HEAD CT COMPARISON:  CT/CT angiogram of the head and neck July 10, 2022. MEDICATIONS: No antibiotics administered. ANESTHESIA/SEDATION: The procedure was performed under general anesthesia. CONTRAST:  40 mL of Omnipaque 300 milligram/mL. FLUOROSCOPY: Radiation Exposure Index (as provided by  the fluoroscopic device): 365 mGy Kerma COMPLICATIONS: None immediate. TECHNIQUE: Informed verbal consent was obtained from the patient's daughter via telephone after a thorough discussion of the procedural risks, benefits and alternatives. All questions were addressed. Maximal Sterile Barrier Technique was utilized including caps, mask, sterile gowns, sterile gloves, sterile drape, hand hygiene and skin antiseptic. A timeout was performed prior to the initiation of the procedure. The right groin was prepped and draped in the usual sterile fashion. Using a micropuncture kit and the modified Seldinger technique, access was gained to the right common femoral artery and an 8 French sheath was placed. Real-time ultrasound guidance was utilized for vascular access including the acquisition of a permanent ultrasound image documenting patency of the accessed vessel. Under fluoroscopy, a Zoom 88 guide catheter was navigated over a 6 [place] Berenstein 2 catheter and a 0.035" Terumo Glidewire into the aortic arch. The catheter was placed into the right common carotid artery and then advanced into the right internal carotid artery. The diagnostic catheter was removed. Frontal and lateral angiograms of the head were obtained. FINDINGS: 1. Normal caliber of the right common femoral artery, adequate for vascular access. 2. Interval recanalization of the right M1/MCA segment with embolization to the proximal right M3/MCA posterior division branch. 3. Normal flow to the right ACA vascular tree. PROCEDURE: Using biplane roadmap guidance, a Red 62 aspiration catheter was navigated over Colossus 35 microguidewire into the cavernous segment of the right ICA. The aspiration catheter was then advanced to the level of occlusion in the M3 segment and connected to an aspiration pump. Continuous aspiration was performed for 1.5 minutes. The guide catheter was connected to a VacLok syringe. The aspiration catheter was subsequently removed  under  constant aspiration. The guide catheter was aspirated for debris. Right internal carotid artery angiograms with frontal and lateral views of the head review complete recanalization of the right MCA vascular tree (TICI 3). Flat panel CT of the head was obtained and post processed in a separate workstation with concurrent attending physician supervision. Selected images were sent to PACS. No evidence of hemorrhagic complication. The catheter was subsequently withdrawn. Right common femoral artery angiogram was obtained in right anterior oblique view. The puncture is at the level of the common femoral artery. The artery has normal caliber, adequate for closure device. The sheath was exchanged over the wire for a Perclose prostyle which was utilized for access closure. Immediate hemostasis was achieved. IMPRESSION: 1. Successful mechanical thrombectomy for treatment of a right M3/MCA occlusion achieving complete recanalization after 1 direct contact aspiration pass (TICI 3). 2. No thromboembolic or hemorrhagic complication. PLAN: Transfer to ICU for observation. Electronically Signed   By: Baldemar Lenis M.D.   On: 07/11/2022 10:40   ECHOCARDIOGRAM COMPLETE  Result Date: 07/11/2022    ECHOCARDIOGRAM REPORT   Patient Name:   DENNY MCCREE Date of Exam: 07/11/2022 Medical Rec #:  [id number]       Height:       67.0 in Accession #:    [id number]      Weight:       183.8 lb Date of Birth:  September 12, 1965       BSA:          1.951 m Patient Age:    56 years        BP:           89/67 mmHg Patient Gender: M               HR:           86 bpm. Exam Location:  Inpatient Procedure: 2D Echo, Color Doppler and Cardiac Doppler Indications:    Stroke  History:        Patient has prior history of Echocardiogram examinations, most                 recent 11/07/2021. Sarcoidosis and CHF, CAD; Risk                 Factors:Hypertension.  Sonographer:    Darlys Gales Referring Phys: [id number] ERIC LINDZEN IMPRESSIONS  1.  Left ventricular ejection fraction, by estimation, is <20%. The left ventricle has severely decreased function. The left ventricle demonstrates regional wall motion abnormalities (see scoring diagram/findings for description). The left ventricular internal cavity size was severely dilated. Left ventricular diastolic parameters are consistent with Grade II diastolic dysfunction (pseudonormalization). Elevated left ventricular end-diastolic pressure.  2. Right ventricular systolic function is normal. The right ventricular size is normal. There is moderately elevated pulmonary artery systolic pressure.  3. Left atrial size was mildly dilated.  4. Device lead noted.  5. Moderate pericardial effusion. The pericardial effusion is localized near the right atrium and anterior to the right ventricle. There is no evidence of cardiac tamponade.  6. The mitral valve is normal in structure. Moderate mitral valve regurgitation. No evidence of mitral stenosis.  7. Tricuspid valve regurgitation is moderate.  8. The aortic valve is normal in structure. Aortic valve regurgitation is not visualized. No aortic stenosis is present.  9. The inferior vena cava is normal in size with greater than 50% respiratory variability, suggesting right atrial pressure of 3 mmHg. FINDINGS  Left Ventricle: Left ventricular ejection  fraction, by estimation, is <20%. The left ventricle has severely decreased function. The left ventricle demonstrates regional wall motion abnormalities. The left ventricular internal cavity size was severely dilated. There is no left ventricular hypertrophy. Left ventricular diastolic parameters are consistent with Grade II diastolic dysfunction (pseudonormalization). Elevated left ventricular end-diastolic pressure.  LV Wall Scoring: The inferior septum is akinetic. The entire anterior wall, entire lateral wall, anterior septum, entire inferior wall, and apex are hypokinetic. Right Ventricle: The right ventricular size is  normal. No increase in right ventricular wall thickness. Right ventricular systolic function is normal. There is moderately elevated pulmonary artery systolic pressure. The tricuspid regurgitant velocity is 3.74 m/s, and with an assumed right atrial pressure of 3 mmHg, the estimated right ventricular systolic pressure is 59.0 mmHg. Left Atrium: Left atrial size was mildly dilated. Right Atrium: Device lead noted. Right atrial size was normal in size. Pericardium: A moderately sized pericardial effusion is present. The pericardial effusion is localized near the right atrium and anterior to the right ventricle. There is no evidence of cardiac tamponade. Mitral Valve: The mitral valve is normal in structure. Moderate mitral valve regurgitation. No evidence of mitral valve stenosis. Tricuspid Valve: The tricuspid valve is normal in structure. Tricuspid valve regurgitation is moderate . No evidence of tricuspid stenosis. Aortic Valve: The aortic valve is normal in structure. Aortic valve regurgitation is not visualized. No aortic stenosis is present. Aortic valve mean gradient measures 4.0 mmHg. Aortic valve peak gradient measures 6.5 mmHg. Aortic valve area, by VTI measures 2.40 cm. Pulmonic Valve: The pulmonic valve was not well visualized. Pulmonic valve regurgitation is not visualized. No evidence of pulmonic stenosis. Aorta: The aortic root is normal in size and structure. Venous: The inferior vena cava is normal in size with greater than 50% respiratory variability, suggesting right atrial pressure of 3 mmHg. IAS/Shunts: No atrial level shunt detected by color flow Doppler. Additional Comments: A device lead is visualized.  LEFT VENTRICLE PLAX 2D LVIDd:         7.60 cm   Diastology LVIDs:         7.00 cm   LV e' medial:    3.81 cm/s LV PW:         1.35 cm   LV E/e' medial:  27.8 LV IVS:        0.65 cm   LV e' lateral:   5.55 cm/s LVOT diam:     2.00 cm   LV E/e' lateral: 19.1 LV SV:         59 LV SV Index:   30  LVOT Area:     3.14 cm  RIGHT VENTRICLE            IVC RV S prime:     9.90 cm/s  IVC diam: 2.50 cm TAPSE (M-mode): 2.0 cm LEFT ATRIUM             Index LA diam:        4.60 cm 2.36 cm/m LA Vol (A2C):   27.9 ml 14.30 ml/m LA Vol (A4C):   85.1 ml 43.62 ml/m LA Biplane Vol: 52.9 ml 27.12 ml/m  AORTIC VALVE AV Area (Vmax):    2.82 cm AV Area (Vmean):   2.51 cm AV Area (VTI):     2.40 cm AV Vmax:           127.00 cm/s AV Vmean:          93.000 cm/s AV VTI:  0.245 m AV Peak Grad:      6.5 mmHg AV Mean Grad:      4.0 mmHg LVOT Vmax:         114.00 cm/s LVOT Vmean:        74.300 cm/s LVOT VTI:          0.187 m LVOT/AV VTI ratio: 0.76  AORTA Ao Root diam: 2.90 cm MITRAL VALVE                  TRICUSPID VALVE MV Area (PHT): 4.21 cm       TR Peak grad:   56.0 mmHg MV Decel Time: 180 msec       TR Vmax:        374.00 cm/s Charles Peak grad:    52.4 mmHg Charles Mean grad:    36.0 mmHg    SHUNTS Charles Vmax:         362.00 cm/s  Systemic VTI:  0.19 m Charles Vmean:        280.0 cm/s   Systemic Diam: 2.00 cm Charles PISA:         12.32 cm Charles PISA Eff ROA: 117 mm Charles PISA Radius:  1.40 cm MV E velocity: 106.00 cm/s MV A velocity: 48.20 cm/s MV E/A ratio:  2.20 Kardie Tobb DO Electronically signed by Thomasene Ripple DO Signature Date/Time: 07/11/2022/10:39:52 AM    Final      Medications:     Current Medications:  [START ON 07/12/2022]  stroke: early stages of recovery book   Does not apply Once   Chlorhexidine Gluconate Cloth  6 each Topical Daily   Infusions:  sodium chloride 50 mL/hr at 07/11/22 0700    Patient Profile   Charles Elmquist is a 57 y/o with HTN, GERD, systolic HF due to NICM, 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 Admitted with acute CVA, hx cardiac sarcoid and HFrEF.   Assessment/Plan  1. Acute Stroke - Admitted with acute L sided weakness, CTA revealed an acute right M1 occlusion. S/p TPA. - s/p thrombectomy 6/24, vascular following - BP goal 110-160  -suspect cardioembolic with reduced EF, will need to  start [facility] - plan cMRI later  2. Acute on Chronic Systolic HF - Diagnosed 11/2019. Presented with VT. LHC 70% LAD  - Echo EF 25-30%  - cMRI 12/21 concerning for sarcoid and EF 18%.  - PET 2/22 at [facility] EF 25% + active sarcoid - Echo 08/26/20 EF < 20% severely dilated LV RV mildly decreased.  - CPX 8/22. Unable to complete PFTs due to hypoventilation and poor effort. pVO2 16.1 (49% predicted). Slope 19 RER: 1.17 PETCO2 59 - CRT-D upgrade in 06/08/21 - Echo 11/07/21: EF 20-25% RV mild to moderately reduced.  - Echo 07/10/22: EF <20%, no RWMA, normal RV, mod pericardial effusion, mod Charles/TR - NYHA IIIb, volume status elevated. Would avoid further fluid boluses, needs diuresis. SBP 130s on a line. Will give 80 IV lasix, follow response.  - GDMT on hold with soft BP / permissive hypertension. Goal 110-160s per neurology.  - permissive hypertension until cleared by neurology.  - start digoxin 0.125 mcg daily - Hgb A1c 5.1 this admission, plan to add back SGLT2i prior to discharge - strict I&O, daily weights   3. 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 Continue mexilitene  - now s/p ICD. VT quiescent - Keep K > 4.0 Mg > 2.0 - need to interrogate device  4. CAD - LHC 12/07/19 70-% LAD, no intervention - No s/s ischemia, denies CP - Continue statin and aspirin.  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 Jeralyn Ruths at [facility].  - Has completed prednisone. Now on MTX 20 weekly. Continue folic acid - We discussed EM biopsy but patient is reluctant  - Needs repeat PET, wasn't able to schedule 10/23 as insurance would not cover. Going from medicare>medicaid at the end of this month. Will plan to reorder PET then.   6. Pericardial effusion - Mod on echo yesterday - will follow  Length of Stay: 1  Alen Bleacher, NP  07/11/2022, 10:50 AM  Advanced Heart Failure Team Pager [phone] (M-F; 7a - 5p)  Please contact [facility]  for night-coverage after hours (4p -7a ) and weekends on [email or web address]  Patient seen and examined with the above-signed Advanced Practice Provider and/or Housestaff. I personally reviewed laboratory data, imaging studies and relevant notes. I independently examined the patient and formulated the important aspects of the plan. I have edited the note to reflect any of my changes or salient points. I have personally discussed the plan with the patient and/or family.  58 y/o male with severe systolic HF due to NICM and cardia sarcoid. Admitted with embolic CVA. Now s/p t-pa and mechanical clot extraction.   Post procedure developed hypotension. Given IVF.  SBP now 130s. Feels volume overloaded. Denies CP.   Echo LV markedly dilated EF < 20% small effusion. RV ok   General:  Sitting on side of bed No resp difficulty HEENT: normal Neck: supple. JVP hard to see but appears elevated to ear.  Carotids 2+ bilat; no bruits. No lymphadenopathy or thryomegaly appreciated. Cor: PMI nondisplaced. Regular rate & rhythm. +s3 Lungs: clear Abdomen: soft, nontender, nondistended. No hepatosplenomegaly. No bruits or masses. Good bowel sounds. Extremities: no cyanosis, clubbing, rash, edema Neuro: alert & orientedx3, cranial nerves grossly intact. moves all 4 extremities w/o difficulty. Affect pleasant  Suspect stroke cardio-embolic in nature. Will eventually need DOAC.  Appears volume overloaded on exam. Will give one dose IV lasix and reassess. If minimal response will have low threshold to place PICC to check co-ox and CVP. Add digoxin.  GDMT on hold to preserve BP in setting of recent CVA.   I suspect we will need to consider potential VAD placement in near future.   Arvilla Meres, MD  1:03 PM

## 2022-07-12 ENCOUNTER — Inpatient Hospital Stay (HOSPITAL_COMMUNITY): Payer: Medicare Other

## 2022-07-12 ENCOUNTER — Other Ambulatory Visit (HOSPITAL_COMMUNITY): Payer: Self-pay

## 2022-07-12 DIAGNOSIS — I5023 Acute on chronic systolic (congestive) heart failure: Secondary | ICD-10-CM | POA: Diagnosis not present

## 2022-07-12 DIAGNOSIS — Z9889 Other specified postprocedural states: Secondary | ICD-10-CM | POA: Diagnosis not present

## 2022-07-12 DIAGNOSIS — I255 Ischemic cardiomyopathy: Secondary | ICD-10-CM | POA: Diagnosis not present

## 2022-07-12 DIAGNOSIS — I639 Cerebral infarction, unspecified: Secondary | ICD-10-CM

## 2022-07-12 LAB — BASIC METABOLIC PANEL
Anion gap: 10 (ref 5–15)
Anion gap: 10 (ref 5–15)
BUN: 25 mg/dL — ABNORMAL HIGH (ref 6–20)
BUN: 24 mg/dL — ABNORMAL HIGH (ref 6–20)
CO2: 33 mmol/L — ABNORMAL HIGH (ref 22–32)
CO2: 30 mmol/L (ref 22–32)
Calcium: 8.9 mg/dL (ref 8.9–10.3)
Calcium: 9 mg/dL (ref 8.9–10.3)
Chloride: 95 mmol/L — ABNORMAL LOW (ref 98–111)
Chloride: 96 mmol/L — ABNORMAL LOW (ref 98–111)
Creatinine, Ser: 1.33 mg/dL — ABNORMAL HIGH (ref 0.61–1.24)
Creatinine, Ser: 1.15 mg/dL (ref 0.61–1.24)
GFR, Estimated: >60 mL/min (ref >60)
GFR, Estimated: >60 mL/min (ref >60)
Glucose, Bld: 118 mg/dL — ABNORMAL HIGH (ref 70–99)
Glucose, Bld: 107 mg/dL — ABNORMAL HIGH (ref 70–99)
Potassium: 5.5 mmol/L — ABNORMAL HIGH (ref 3.5–5.1)
Potassium: 4.5 mmol/L (ref 3.5–5.1)
Sodium: 138 mmol/L (ref 135–145)
Sodium: 136 mmol/L (ref 135–145)

## 2022-07-12 LAB — POCT I-STAT 7, (LYTES, BLD GAS, ICA,H+H)
Acid-Base Excess: 4 mmol/L — ABNORMAL HIGH (ref 0.0–2.0)
Bicarbonate: 30 mmol/L — ABNORMAL HIGH (ref 20.0–28.0)
Calcium, Ion: 1.15 mmol/L (ref 1.15–1.40)
HCT: 33 % — ABNORMAL LOW (ref 39.0–52.0)
Hemoglobin: 11.2 g/dL — ABNORMAL LOW (ref 13.0–17.0)
O2 Saturation: 100 %
Potassium: 4 mmol/L (ref 3.5–5.1)
Sodium: 136 mmol/L (ref 135–145)
TCO2: 32 mmol/L (ref 22–32)
pCO2 arterial: 51.4 mmHg — ABNORMAL HIGH (ref 32–48)
pH, Arterial: 7.374 (ref 7.35–7.45)
pO2, Arterial: 476 mmHg — ABNORMAL HIGH (ref 83–108)

## 2022-07-12 LAB — IRON AND TIBC
Iron: 42 ug/dL — ABNORMAL LOW (ref 45–182)
Saturation Ratios: 13 % — ABNORMAL LOW (ref 17.9–39.5)
TIBC: 326 ug/dL (ref 250–450)
UIBC: 284 ug/dL

## 2022-07-12 LAB — RETICULOCYTES
Immature Retic Fract: 18.3 % — ABNORMAL HIGH (ref 2.3–15.9)
RBC.: 3.39 MIL/uL — ABNORMAL LOW (ref 4.22–5.81)
Retic Count, Absolute: 40 10*3/uL (ref 19.0–186.0)
Retic Ct Pct: 1.2 % (ref 0.4–3.1)

## 2022-07-12 LAB — FOLATE: Folate: 8.8 ng/mL (ref >5.9)

## 2022-07-12 LAB — FERRITIN: Ferritin: 221 ng/mL (ref 24–336)

## 2022-07-12 LAB — VITAMIN B12: Vitamin B-12: 278 pg/mL (ref 180–914)

## 2022-07-12 LAB — MAGNESIUM: Magnesium: 2.2 mg/dL (ref 1.7–2.4)

## 2022-07-12 MED ORDER — ASPIRIN 325 MG PO TBEC
325.0000 mg | DELAYED_RELEASE_TABLET | Freq: Every day | ORAL | Status: DC
  Administered 2022-07-12 – 2022-07-14 (×3): 325 mg via ORAL
  Filled 2022-07-12 (×3): qty 1

## 2022-07-12 MED ORDER — METHOTREXATE SODIUM 2.5 MG PO TABS
20.0000 mg | ORAL_TABLET | ORAL | Status: DC
  Administered 2022-07-15: 20 mg via ORAL
  Filled 2022-07-12: qty 8

## 2022-07-12 MED ORDER — FUROSEMIDE 10 MG/ML IJ SOLN
80.0000 mg | Freq: Once | INTRAMUSCULAR | Status: AC
  Administered 2022-07-12: 80 mg via INTRAVENOUS
  Filled 2022-07-12: qty 8

## 2022-07-12 MED ORDER — FUROSEMIDE 10 MG/ML IJ SOLN: 80.0000 mg | Freq: Once | INTRAMUSCULAR | Status: DC

## 2022-07-12 MED ORDER — FOLIC ACID 1 MG PO TABS
1.0000 mg | ORAL_TABLET | Freq: Every day | ORAL | Status: DC
  Administered 2022-07-12 – 2022-07-16 (×5): 1 mg via ORAL
  Filled 2022-07-12 (×5): qty 1

## 2022-07-12 MED ORDER — SODIUM CHLORIDE 0.9 % IV SOLN
510.0000 mg | Freq: Once | INTRAVENOUS | Status: AC
  Administered 2022-07-13: 510 mg via INTRAVENOUS
  Filled 2022-07-12: qty 17

## 2022-07-12 NOTE — Progress Notes (Addendum)
STROKE TEAM PROGRESS NOTE   BRIEF HPI [name] is a 57 y.o. male with history of CAD, systolic heart failure secondary to possible cardiac sarcoidosis, wide complex tachycardia, ICD implant, HLD and HTN who presented to [facility] this evening with acute onset of left sided weakness. [name] was 7:30 PM. BP prior to arrival was in the low range at 104/56 per EMS. CT head was negative for acute abnormality with ASPECTS of 10. Teleneurology was consulted and he was deemed to be a tPA candidate, with tPA infusion started at [facility]. CTA revealed an acute right M1 occlusion and he was emergently transferred to [facility] for a mechanical thrombectomy.   SIGNIFICANT HOSPITAL EVENTS 6/24 - Mechanical thrombectomy  6/25- CT at 2000  INTERM HISTORY/SUBJECTIVE Cardiology seen patient, will have heart cath on Friday MRI today, results pending Will transfer out of ICU to cardiac progressive  OBJECTIVE  CBC    Component Value Date/Time   WBC 9.9 07/11/2022 0209   RBC 3.39 (L) 07/12/2022 0504   RBC 3.38 (L) 07/11/2022 0209   HGB 11.9 (L) 07/11/2022 0318   HGB 14.3 05/31/2021 0906   HCT 35.0 (L) 07/11/2022 0318   HCT 42.6 05/31/2021 0906   PLT 163 07/11/2022 0209   PLT 203 05/31/2021 0906   MCV 104.4 (H) 07/11/2022 0209   MCV 98 (H) 05/31/2021 0906   MCH 33.4 07/11/2022 0209   MCHC 32.0 07/11/2022 0209   RDW 14.1 07/11/2022 0209   RDW 15.8 (H) 05/31/2021 0906   LYMPHSABS 0.9 05/31/2021 0906   MONOABS 0.8 02/03/2020 1520   EOSABS 0.1 05/31/2021 0906   BASOSABS 0.0 05/31/2021 0906    BMET    Component Value Date/Time   NA 136 07/12/2022 0925   NA 143 05/31/2021 0906   K 4.5 07/12/2022 0925   CL 96 (L) 07/12/2022 0925   CO2 30 07/12/2022 0925   GLUCOSE 107 (H) 07/12/2022 0925   BUN 24 (H) 07/12/2022 0925   BUN 12 05/31/2021 0906   CREATININE 1.15 07/12/2022 0925   CALCIUM 9.0 07/12/2022 0925   EGFR 80 05/31/2021 0906   GFRNONAA >60 07/12/2022 0925    IMAGING past 24 hours CT HEAD WO  CONTRAST ( )  Result Date: 07/11/2022 CLINICAL DATA:  Stroke follow-up EXAM: CT HEAD WITHOUT CONTRAST TECHNIQUE: Contiguous axial images were obtained from the base of the skull through the vertex without intravenous contrast. RADIATION DOSE REDUCTION: This exam was performed according to the departmental dose-optimization program which includes automated exposure control, adjustment of the mA and/or kV according to patient size and/or use of iterative reconstruction technique. COMPARISON:  07/10/2022 CT head FINDINGS: Brain: Mild hypodensity to in the right anterior temporal lobe and insula, with some mild narrowing of the right sylvian fissure compared to the prior exam, likely mild edema related to the MCA occlusion noted on the prior exam. No evidence of acute hemorrhage, mass, mass effect, or midline shift. No hydrocephalus or extra-axial fluid collection. No distal area of hypodensity to suggest additional acute infarct. Vascular: No hyperdense vessel. Skull: Negative for fracture or focal lesion. Sinuses/Orbits: Mild mucosal thickening in the right maxillary sinus. No acute finding in the orbits. Other: The mastoid air cells are well aerated. IMPRESSION: Mild hypodensity to in the right anterior temporal lobe and insula, likely mild edema related to the MCA occlusion noted on the prior exam. No evidence of hemorrhagic transformation or significant midline shift. Electronically Signed   By: Wiliam Ke M.D.   On: 07/11/2022  19:20    Vitals:   07/12/22 0700 07/12/22 0800 07/12/22 0900 07/12/22 1000  BP: 101/75 104/76    Pulse: 100 89 94 94  Resp: 17 17 (!) 25 20  Temp:  98.1 F (36.7 C)    TempSrc:      SpO2: 96% (!) 89% 90% 93%  Weight:         PHYSICAL EXAM General:  Alert, well-nourished, well-developed patient in no acute distress Psych:  Mood and affect appropriate for situation CV: Regular rate and rhythm on monitor Respiratory:  Regular, unlabored respirations on room air GI:  Abdomen soft and nontender   NEURO:  Mental Status: AA&Ox3, patient is able to give clear and coherent history Speech/Language: speech is without dysarthria or aphasia.  Naming, repetition, fluency, and comprehension intact.  Cranial Nerves:  II: PERRL. Visual fields full.  III, IV, VI: EOMI. Eyelids elevate symmetrically.  V: Sensation is intact to light touch and symmetrical to face.  VII: Face is symmetrical resting and smiling VIII: hearing intact to voice. IX, X:Phonation is normal.  XII: tongue is midline without fasciculations. Motor: 5/5 strength to all muscle groups tested.  Tone: is normal and bulk is normal Sensation- Intact to light touch bilaterally Coordination: FTN intact bilaterally- mild asterixis bilaterally.No drift.  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 s/p tPA and IR with TICI3, likely due to cardiomyopathy with low EF Code Stroke CT head no acute abnormality  CTA head & neck right distal M1 occlusion  IR with proximal right M3 occlusion and TICI3 perfusion Post IR CT no hemorrhage Repeat CT mild hypodensity in right anterior temporal lobe and insula, no evidence of hemorrhagic transformation or significant midline shift MRI right temporal lobe, insular cortex and BG infarcts. MRA head right MCA patent 2D Echo <20% ICD interrogation no A-fib LDL 59 HgbA1c 5.1 VTE prophylaxis - SCDs aspirin 81 mg daily prior to admission, now on aspirin 325 mg daily. Will consider AC after heart cath 6/28 Therapy recommendations: Outpatient OT, no PT Disposition:  pending   Chronic Systolic Heart Failure Follows with HF team as outpt Current echo showed EF < 20% HF team consulted Put on digoxin S/p lasix x 2 GDMT on hold given soft BP Cardiac cath Friday  Hx of hypertension Hypotension One time dose of lasix per cardiology  GDMT on hold  BP 90-100s Close monitoring  Cardiac Sarcoidosis Hx of  VT Continue mexilitene ICD interrogation no afib  CAD Home meds: Coreg 3.125, Lasix 40mg , Lostartan 25mg , Aldactone 12.5, Farxiga 10mg , mexilitene Hypotensive Cardiology consulted, appreciate help Follows with Dr. Gala Romney who saw him inpatient as well - recommends oral anticoagulation  - Cardiac MRI - Digoxin 0.164mcg - Hold SGLT2i until closer to discharge  Hyperlipidemia Home meds:  Atorvastatin 80mg  LDL 59, goal < 70 On lipitor 80 Continue statin at discharge  Diabetes type II Controlled Home meds:  Farxiga HgbA1c 5.1, goal < 7.0 CBGs SSI Recommend close follow-up with PCP for better DM control  Other Stroke Risk Factors Obstructive sleep apnea CO2 retention, used bipap overnight inpatient Need outpt sleep study done   Hospital day # 2  Patient seen and examined by NP/APP with MD. MD to update note as needed.   Cortney E Ernestina Columbia , MSN, AGACNP-BC [facility] See Amion for schedule and pager information 07/12/2022 12:31 PM   ATTENDING NOTE: I reviewed above note and agree with the assessment and plan. Pt  was seen and examined.   No family at bedside.  Patient doing well, sitting in chair, neurologically intact.  Cardiology heart failure team on board, plan for right heart cath Friday.  MRI brain showed right temporal lobe, insular cortex and BG/CR infarcts.  MRA showed right MCA patent now.  Continue aspirin 325, consider anticoagulation after heart cath on Friday.  Appreciate cardiology for heart failure management.  Continue statin.  For detailed assessment and plan, please refer to above/below as I have made changes wherever appropriate.   Marvel Plan, MD PhD Stroke Neurology 07/12/2022 6:49 PM  This patient is critically ill due to stroke with right M1 occlusion s/p TPA and IR, CHF, low EF, hypotension and at significant risk of neurological worsening, death form recurrent stroke, hemorrhagic conversion, bleeding from TPA, heart failure,  shock. This patient's care requires constant monitoring of vital signs, hemodynamics, respiratory and cardiac monitoring, review of multiple databases, neurological assessment, discussion with family, other specialists and medical decision making of high complexity. I spent 35 minutes of neurocritical care time in the care of this patient.   To contact Stroke Continuity provider, please refer to [email or web address]. After hours, contact General Neurology

## 2022-07-12 NOTE — Plan of Care (Signed)
  Problem: Education: Goal: Understanding of CV disease, CV risk reduction, and recovery process will improve Outcome: Progressing   Problem: Activity: Goal: Ability to return to baseline activity level will improve Outcome: Progressing   Problem: Cardiovascular: Goal: Ability to achieve and maintain adequate cardiovascular perfusion will improve Outcome: Progressing Goal: Vascular access site(s) Level 0-1 will be maintained Outcome: Progressing   Problem: Education: Goal: Knowledge of disease or condition will improve Outcome: Progressing Goal: Knowledge of secondary prevention will improve (MUST DOCUMENT ALL) Outcome: Progressing   Problem: Ischemic Stroke/TIA Tissue Perfusion: Goal: Complications of ischemic stroke/TIA will be minimized Outcome: Progressing   Problem: Coping: Goal: Will verbalize positive feelings about self Outcome: Progressing Goal: Will identify appropriate support needs Outcome: Progressing   Problem: Health Behavior/Discharge Planning: Goal: Ability to manage health-related needs will improve Outcome: Progressing Goal: Goals will be collaboratively established with patient/family Outcome: Progressing   Problem: Self-Care: Goal: Ability to participate in self-care as condition permits will improve Outcome: Progressing Goal: Verbalization of feelings and concerns over difficulty with self-care will improve Outcome: Progressing Goal: Ability to communicate needs accurately will improve Outcome: Progressing   Problem: Nutrition: Goal: Risk of aspiration will decrease Outcome: Progressing Goal: Dietary intake will improve Outcome: Progressing

## 2022-07-12 NOTE — TOC Initial Note (Addendum)
Transition of Care [facility]) - Initial/Assessment Note    Patient Details  Name: Charles Holmes MRN: [id number] Date of Birth: Apr 25, 1965  Transition of [facility]) CM/SW Contact:    Elliot Cousin, RN Phone Number: [phone] 07/12/2022, 2:48 PM  Clinical Narrative:  HF TOC CM spoke to pt and states he lives alone. His dtr will assist as needed. States he drives to his appts. Pt does not feel any DME needed. Will arrange PCP hospital follow up appt closer to dc date. He has scale at home for daily weight. Pt states he has Medicare and Medicaid and should be able to afford his medications.   Will continue to follow for dc needs.    Patient states their goals for this hospitalization and ongoing recovery are:: wants to remain independent          Expected Discharge Plan and Services   Discharge Planning Services: CM Consult   Living arrangements for the past 2 months: Mobile Home                                      Prior Living Arrangements/Services Living arrangements for the past 2 months: Mobile Home Lives with:: Self Patient language and need for interpreter reviewed:: Yes Do you feel safe going back to the place where you live?: Yes      Need for Family Participation in Patient Care: No (Comment) Care giver support system in place?: No (comment) Current home services: DME (scale) Criminal Activity/Legal Involvement Pertinent to Current Situation/Hospitalization: No - Comment as needed  Activities of Daily Living      Permission Sought/Granted Permission sought to share information with : Case Manager, Family Supports, PCP Permission granted to share information with : Yes, Verbal Permission Granted  Share Information with NAME: [name]  Permission granted to share info w AGENCY: DME  Permission granted to share info w Relationship: daughter  Permission granted to share info w Contact Information: [phone]  Emotional  Assessment Appearance:: Appears stated age Attitude/Demeanor/Rapport: Engaged Affect (typically observed): Accepting Orientation: : Oriented to Self, Oriented to Place, Oriented to  Time, Oriented to Situation   Psych Involvement: No (comment)  Admission diagnosis:  Status post surgery [Z98.890] Stroke (cerebrum) [facility]) [I63.9] Patient Active Problem List   Diagnosis Date Noted   Stroke (cerebrum) (HCC) 07/11/2022   Status post surgery 07/10/2022   Chronic systolic heart failure (HCC) 02/21/2020   Ventricular tachycardia (HCC) 02/19/2020   Ventricular tachycardia, sustained (HCC) 02/19/2020   AKI (acute kidney injury) (HCC) 12/19/2019   CAD (coronary artery disease) 12/19/2019   Hyperlipidemia with target LDL less than 70 12/19/2019   Wide-complex tachycardia 12/16/2019   Acute systolic CHF (congestive heart failure) (HCC) 12/16/2019   HTN (hypertension) 12/16/2019   PCP:  [name], PA-C Pharmacy:   [facility] [address] [place] [address] Phone: [phone] Fax: [phone]  [id number] - [place], [place] - [address] AT [facility] [address] [place] [address] Phone: [phone] Fax: [phone]     Social Determinants of Health (SDOH) Social History: SDOH Screenings   Food Insecurity: No Food Insecurity (07/12/2022)  Housing: Low Risk  (07/12/2022)  Transportation Needs: No Transportation Needs (07/12/2022)  Utilities: Not At Risk (07/12/2022)  Financial Resource Strain: Low Risk  (07/12/2022)  Tobacco Use: Medium Risk (07/11/2022)   SDOH Interventions: Food Insecurity  Interventions: Intervention Not Indicated Housing Interventions: Intervention Not Indicated Transportation Interventions: Intervention Not Indicated Utilities Interventions: Intervention Not Indicated Financial Strain Interventions: Intervention Not Indicated   Readmission Risk Interventions     No data to display

## 2022-07-12 NOTE — Progress Notes (Addendum)
Advanced Heart Failure Rounding Note  PCP-Cardiologist: Norman Herrlich, MD  HF: Dr. Gala Romney  Subjective:     Good diuresis with IV lasix yesterday.   SBP 90s-100s  Scr 1.1>1.3 K 5.5  Feels okay. No dyspnea at rest. Ambulated 150 feet with PT yesterday.  Objective:    Echo 07/12/22:  EF < 20%, grade III DD, RV okay, moderate pericardial effusion, moderate MR, moderate TR  Weight Range: 82.1 kg Body mass index is 28.35 kg/m.   Vital Signs:   Temp:  [96.8 F (36 C)-98.1 F (36.7 C)] 97.8 F (36.6 C) (06/26 0400) Pulse Rate:  [62-100] 89 (06/26 0800) Resp:  [11-27] 17 (06/26 0800) BP: (89-131)/(64-82) 104/76 (06/26 0800) SpO2:  [87 %-97 %] 89 % (06/26 0800) Arterial Line BP: (80-146)/(52-93) 80/71 (06/25 2000) Weight:  [82.1 kg] 82.1 kg (06/26 0500) Last BM Date :  (PTA)  Weight change: Filed Weights   07/12/22 0500  Weight: 82.1 kg    Intake/Output:   Intake/Output Summary (Last 24 hours) at 07/12/2022 0818 Last data filed at 07/12/2022 0800 Gross per 24 hour  Intake 511.21 ml  Output 2600 ml  Net -2088.79 ml      Physical Exam    General:  Sitting up in bed. No distress. HEENT: Normal Neck: Supple. JVP 10-12. Carotids 2+ bilat; no bruits.  Cor: PMI nondisplaced. Regular rate & rhythm. No rubs, gallops or murmurs. Lungs: Clear Abdomen: Soft, nontender, mildly distended.  Extremities: No cyanosis, clubbing, rash, edema Neuro: Alert & orientedx3. Affect pleasant   Telemetry   SR 80s, runs of NSVT  Labs    CBC Recent Labs    07/11/22 0209 07/11/22 0318  WBC 9.9  --   HGB 11.3* 11.9*  HCT 35.3* 35.0*  MCV 104.4*  --   PLT 163  --    Basic Metabolic Panel Recent Labs    16/10/96 0209 07/11/22 0318 07/12/22 0504  NA 136 138 138  K 4.2 4.4 5.5*  CL 97*  --  95*  CO2 27  --  33*  GLUCOSE 125*  --  118*  BUN 27*  --  25*  CREATININE 1.11  --  1.33*  CALCIUM 8.7*  --  8.9  MG  --   --  2.2   Liver Function Tests Recent Labs     07/11/22 0209  AST 25  ALT 25  ALKPHOS 79  BILITOT 2.4*  PROT 6.1*  ALBUMIN 3.5   No results for input(s): "LIPASE", "AMYLASE" in the last 72 hours. Cardiac Enzymes No results for input(s): "CKTOTAL", "CKMB", "CKMBINDEX", "TROPONINI" in the last 72 hours.  BNP: BNP (last 3 results) Recent Labs    11/07/21 1457 07/11/22 0209  BNP 284.0* 1,588.2*    ProBNP (last 3 results) No results for input(s): "PROBNP" in the last 8760 hours.   D-Dimer No results for input(s): "DDIMER" in the last 72 hours. Hemoglobin A1C Recent Labs    07/11/22 0655  HGBA1C 5.1   Fasting Lipid Panel Recent Labs    07/11/22 0209  CHOL 102  HDL 33*  LDLCALC 59  TRIG 50  CHOLHDL 3.1   Thyroid Function Tests Recent Labs    07/11/22 0209  TSH 0.974    Other results:   Imaging    CT HEAD WO CONTRAST ( )  Result Date: 07/11/2022 CLINICAL DATA:  Stroke follow-up EXAM: CT HEAD WITHOUT CONTRAST TECHNIQUE: Contiguous axial images were obtained from the base of the skull through the vertex without  intravenous contrast. RADIATION DOSE REDUCTION: This exam was performed according to the departmental dose-optimization program which includes automated exposure control, adjustment of the mA and/or kV according to patient size and/or use of iterative reconstruction technique. COMPARISON:  07/10/2022 CT head FINDINGS: Brain: Mild hypodensity to in the right anterior temporal lobe and insula, with some mild narrowing of the right sylvian fissure compared to the prior exam, likely mild edema related to the MCA occlusion noted on the prior exam. No evidence of acute hemorrhage, mass, mass effect, or midline shift. No hydrocephalus or extra-axial fluid collection. No distal area of hypodensity to suggest additional acute infarct. Vascular: No hyperdense vessel. Skull: Negative for fracture or focal lesion. Sinuses/Orbits: Mild mucosal thickening in the right maxillary sinus. No acute finding in the  orbits. Other: The mastoid air cells are well aerated. IMPRESSION: Mild hypodensity to in the right anterior temporal lobe and insula, likely mild edema related to the MCA occlusion noted on the prior exam. No evidence of hemorrhagic transformation or significant midline shift. Electronically Signed   By: Wiliam Ke M.D.   On: 07/11/2022 19:20   DG CHEST PORT 1 VIEW  Result Date: 07/11/2022 CLINICAL DATA:  Respiratory abnormality. History of hypertension and CHF. EXAM: PORTABLE CHEST 1 VIEW COMPARISON:  Chest radiograph 1 day prior FINDINGS: The left chest wall cardiac device and associated leads are stable. The heart is enlarged, unchanged. The upper mediastinal contours are stable There is mild pulmonary interstitial edema which appears slightly worsened compared to the study from 1 day prior. There is no focal airspace consolidation. There is no significant pleural effusion. There is no pneumothorax There is no acute osseous abnormality. Remote right rib fractures are again noted. IMPRESSION: Cardiomegaly with mild pulmonary interstitial edema, slightly worsened compared to the study from 1 day prior. Electronically Signed   By: Lesia Hausen M.D.   On: 07/11/2022 15:20   ECHOCARDIOGRAM COMPLETE  Result Date: 07/11/2022    ECHOCARDIOGRAM REPORT   Patient Name:   ENDER RORKE Date of Exam: 07/11/2022 Medical Rec #:  [id number]       Height:       67.0 in Accession #:    [id number]      Weight:       183.8 lb Date of Birth:  Dec 10, 1965       BSA:          1.951 m Patient Age:    56 years        BP:           89/67 mmHg Patient Gender: M               HR:           86 bpm. Exam Location:  Inpatient Procedure: 2D Echo, Color Doppler and Cardiac Doppler Indications:    Stroke  History:        Patient has prior history of Echocardiogram examinations, most                 recent 11/07/2021. Sarcoidosis and CHF, CAD; Risk                 Factors:Hypertension.  Sonographer:    Darlys Gales Referring Phys: [phone]  ERIC LINDZEN IMPRESSIONS  1. Left ventricular ejection fraction, by estimation, is <20%. The left ventricle has severely decreased function. The left ventricle demonstrates regional wall motion abnormalities (see scoring diagram/findings for description). The left ventricular internal cavity size was severely dilated. Left ventricular  diastolic parameters are consistent with Grade II diastolic dysfunction (pseudonormalization). Elevated left ventricular end-diastolic pressure.  2. Right ventricular systolic function is normal. The right ventricular size is normal. There is moderately elevated pulmonary artery systolic pressure.  3. Left atrial size was mildly dilated.  4. Device lead noted.  5. Moderate pericardial effusion. The pericardial effusion is localized near the right atrium and anterior to the right ventricle. There is no evidence of cardiac tamponade.  6. The mitral valve is normal in structure. Moderate mitral valve regurgitation. No evidence of mitral stenosis.  7. Tricuspid valve regurgitation is moderate.  8. The aortic valve is normal in structure. Aortic valve regurgitation is not visualized. No aortic stenosis is present.  9. The inferior vena cava is normal in size with greater than 50% respiratory variability, suggesting right atrial pressure of 3 mmHg. FINDINGS  Left Ventricle: Left ventricular ejection fraction, by estimation, is <20%. The left ventricle has severely decreased function. The left ventricle demonstrates regional wall motion abnormalities. The left ventricular internal cavity size was severely dilated. There is no left ventricular hypertrophy. Left ventricular diastolic parameters are consistent with Grade II diastolic dysfunction (pseudonormalization). Elevated left ventricular end-diastolic pressure.  LV Wall Scoring: The inferior septum is akinetic. The entire anterior wall, entire lateral wall, anterior septum, entire inferior wall, and apex are hypokinetic. Right Ventricle:  The right ventricular size is normal. No increase in right ventricular wall thickness. Right ventricular systolic function is normal. There is moderately elevated pulmonary artery systolic pressure. The tricuspid regurgitant velocity is 3.74 m/s, and with an assumed right atrial pressure of 3 mmHg, the estimated right ventricular systolic pressure is 59.0 mmHg. Left Atrium: Left atrial size was mildly dilated. Right Atrium: Device lead noted. Right atrial size was normal in size. Pericardium: A moderately sized pericardial effusion is present. The pericardial effusion is localized near the right atrium and anterior to the right ventricle. There is no evidence of cardiac tamponade. Mitral Valve: The mitral valve is normal in structure. Moderate mitral valve regurgitation. No evidence of mitral valve stenosis. Tricuspid Valve: The tricuspid valve is normal in structure. Tricuspid valve regurgitation is moderate . No evidence of tricuspid stenosis. Aortic Valve: The aortic valve is normal in structure. Aortic valve regurgitation is not visualized. No aortic stenosis is present. Aortic valve mean gradient measures 4.0 mmHg. Aortic valve peak gradient measures 6.5 mmHg. Aortic valve area, by VTI measures 2.40 cm. Pulmonic Valve: The pulmonic valve was not well visualized. Pulmonic valve regurgitation is not visualized. No evidence of pulmonic stenosis. Aorta: The aortic root is normal in size and structure. Venous: The inferior vena cava is normal in size with greater than 50% respiratory variability, suggesting right atrial pressure of 3 mmHg. IAS/Shunts: No atrial level shunt detected by color flow Doppler. Additional Comments: A device lead is visualized.  LEFT VENTRICLE PLAX 2D LVIDd:         7.60 cm   Diastology LVIDs:         7.00 cm   LV e' medial:    3.81 cm/s LV PW:         1.35 cm   LV E/e' medial:  27.8 LV IVS:        0.65 cm   LV e' lateral:   5.55 cm/s LVOT diam:     2.00 cm   LV E/e' lateral: 19.1 LV SV:          59 LV SV Index:   30 LVOT Area:  3.14 cm  RIGHT VENTRICLE            IVC RV S prime:     9.90 cm/s  IVC diam: 2.50 cm TAPSE (M-mode): 2.0 cm LEFT ATRIUM             Index LA diam:        4.60 cm 2.36 cm/m LA Vol (A2C):   27.9 ml 14.30 ml/m LA Vol (A4C):   85.1 ml 43.62 ml/m LA Biplane Vol: 52.9 ml 27.12 ml/m  AORTIC VALVE AV Area (Vmax):    2.82 cm AV Area (Vmean):   2.51 cm AV Area (VTI):     2.40 cm AV Vmax:           127.00 cm/s AV Vmean:          93.000 cm/s AV VTI:            0.245 m AV Peak Grad:      6.5 mmHg AV Mean Grad:      4.0 mmHg LVOT Vmax:         114.00 cm/s LVOT Vmean:        74.300 cm/s LVOT VTI:          0.187 m LVOT/AV VTI ratio: 0.76  AORTA Ao Root diam: 2.90 cm MITRAL VALVE                  TRICUSPID VALVE MV Area (PHT): 4.21 cm       TR Peak grad:   56.0 mmHg MV Decel Time: 180 msec       TR Vmax:        374.00 cm/s MR Peak grad:    52.4 mmHg MR Mean grad:    36.0 mmHg    SHUNTS MR Vmax:         362.00 cm/s  Systemic VTI:  0.19 m MR Vmean:        280.0 cm/s   Systemic Diam: 2.00 cm MR PISA:         12.32 cm MR PISA Eff ROA: 117 mm MR PISA Radius:  1.40 cm MV E velocity: 106.00 cm/s MV A velocity: 48.20 cm/s MV E/A ratio:  2.20 Kardie Tobb DO Electronically signed by Thomasene Ripple DO Signature Date/Time: 07/11/2022/10:39:52 AM    Final      Medications:     Scheduled Medications:   stroke: early stages of recovery book   Does not apply Once   atorvastatin  80 mg Oral Daily   Chlorhexidine Gluconate Cloth  6 each Topical Daily   digoxin  0.125 mg Oral Daily   mexiletine  250 mg Oral Q12H    Infusions:   PRN Medications: mouth rinse, senna-docusate    Assessment/Plan   1. Acute Stroke - Admitted with acute L sided weakness, CTA revealed an acute right M1 occlusion. S/p TPA and mechanical clot extraction. -Likely cardioembolic  in setting of severe LV dysfunction, will eventually need DOAC -No AF on device check. Reviewed with [employer] rep. -Going for  MRI head today   2. Acute on Chronic Systolic HF - Diagnosed 11/2019. Presented with VT. LHC 70% LAD  - Echo EF 25-30%  - cMRI 12/21 concerning for sarcoid and EF 18%.  - PET 2/22 at [facility] EF 25% + active sarcoid - Echo 08/26/20 EF < 20% severely dilated LV RV mildly decreased.  - CPX 8/22. Unable to complete PFTs due to hypoventilation and poor effort. pVO2 16.1 (49% predicted). Slope 19 RER:  1.17 PETCO2 59 - CRT-D upgrade in 06/08/21 - Echo 11/07/21: EF 20-25% RV mild to moderately reduced.  - Echo 07/10/22: EF <20%, RV okay, mod pericardial effusion, mod MR/TR - NYHA IIIb, appears volume up. Good response to IV lasix yesterday. Give 80 mg lasix IV again today. - GDMT on hold with soft BP following recent CVA - continue digoxin 0.125 mcg daily - Hgb A1c 5.1, plan to add back SGLT2i prior to discharge - Plan for RHC on 06/28 - Suspect will need to consider VAD/transplant workup in near future   3. 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 Continue mexilitene  - now s/p ICD. Runs of NSVT on tele. - Keep K > 4.0 Mg > 2.0  4. CAD - LHC 12/07/19 70-% LAD, no intervention - No s/s ischemia, denies CP - Continue statin and aspirin.  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 Jeralyn Ruths at [facility].  - Has completed prednisone. Now on MTX 20 weekly. Continue folic acid. - We discussed EM biopsy but patient is reluctant  - Needs repeat PET, wasn't able to schedule 10/23 as insurance would not cover. Now has medicaid and medicare   6. Pericardial effusion - Mod on echo this admit - will follow  7. Iron deficiency anemia - T sat 13% - Would benefit from feraheme prior to discharge. Wait until after MRIs completed.  8. Noncompliance -Missed some doses of medications prior to admission after running out. -Forgets meds at times, stopped jardiance d/t cost.  -Reviewed with PharmD. Now has medicare and medicaid. Cost should no  longer be an issue  9. Hyperkalemia - K 5.5.  - Has not received K supp - Recheck BMET stat. Suspect may be erroneous  Length of Stay: 2  FINCH, LINDSAY N, PA-C  07/12/2022, 8:18 AM  Advanced Heart Failure Team Pager [phone] (M-F; 7a - 5p)  Please contact [facility] for night-coverage after hours (5p -7a ) and weekends on [email or web address]  Patient seen and examined with the above-signed Advanced Practice Provider and/or Housestaff. I personally reviewed laboratory data, imaging studies and relevant notes. I independently examined the patient and formulated the important aspects of the plan. I have edited the note to reflect any of my changes or salient points. I have personally discussed the plan with the patient and/or family.  Feels ok. No CP or SOB. Diuresed well with IV lasix. Scr stable SBP low 100s No AF on device  General:  Sitting in chair  No resp difficulty HEENT: normal Neck: supple. JVP to ear . Carotids 2+ bilat; no bruits. No lymphadenopathy or thryomegaly appreciated. Cor:  Regular rate & rhythm. No rubs, gallops or murmurs. Lungs: clear Abdomen: soft, nontender, nondistended. No hepatosplenomegaly. No bruits or masses. Good bowel sounds. Extremities: no cyanosis, clubbing, rash, edema Neuro: alert & orientedx3, cranial nerves grossly intact. moves all 4 extremities w/o difficulty. Affect pleasant  Suspect stroke was cardio embolic in setting of worsening LV dysfunction. Remains volume overloaded with soft BP. Will give one more dose IV lasix. Can add BP support as needed. Plan RHC on Friday to ass hemodynamics. Suspect he may be headed to transplant in near future.   Arvilla Meres, MD  10:16 AM

## 2022-07-12 NOTE — Plan of Care (Signed)
Had an 8 beat run of V-tach while asymptomatic at approximately 3:20 AM.   BMP and Mg ordered. Cardiology contacted and they have advised Cardiology follow up in AM for possible medication changes.   Electronically signed: Dr. Caryl Pina

## 2022-07-12 NOTE — Progress Notes (Signed)
Physical Therapy Treatment Patient Details Name: Charles Holmes MRN: [id number] DOB: 15-Jun-1965 Today's Date: 07/12/2022   History of Present Illness 57 yo male s/p trombectomy R MCA PMH CAD, CHF, HLD, HTN, systolic HF, ICD implant, R & L heart cath    PT Comments    Patient mobilizing well under his own power.  Able to complete Dynamic Gait Index with score 23/24 low fall risk.  Noted pt relates likely to have continued work up for further cardiac intervention.  PT will sign off as no further skilled PT needs.  Encouraged to ambulate with nursing till d/c.   Recommendations for follow up therapy are one component of a multi-disciplinary discharge planning process, led by the attending physician.  Recommendations may be updated based on patient status, additional functional criteria and insurance authorization.  Follow Up Recommendations       Assistance Recommended at Discharge Intermittent Supervision/Assistance  Patient can return home with the following Assistance with cooking/housework;Direct supervision/assist for medications management;Assist for transportation   Equipment Recommendations  None recommended by PT    Recommendations for Other Services       Precautions / Restrictions Precautions Precautions: Fall;None     Mobility  Bed Mobility Overal bed mobility: Modified Independent                  Transfers Overall transfer level: Independent                      Ambulation/Gait Ambulation/Gait assistance: Independent Gait Distance (Feet): 300 Feet Assistive device: None Gait Pattern/deviations: WFL(Within Functional Limits), Step-through pattern           Stairs Stairs: Yes Stairs assistance: Supervision Stair Management: One rail Left, Alternating pattern, Forwards Number of Stairs: 3 General stair comments: ascended no rail, descended holding rail with cues   Wheelchair Mobility    Modified Rankin (Stroke Patients  Only) Modified Rankin (Stroke Patients Only) Pre-Morbid Rankin Score: No symptoms Modified Rankin: Moderate disability     Balance Overall balance assessment: Independent                               Standardized Balance Assessment Standardized Balance Assessment : Dynamic Gait Index   Dynamic Gait Index Level Surface: Normal Change in Gait Speed: Normal Gait with Horizontal Head Turns: Normal Gait with Vertical Head Turns: Normal Gait and Pivot Turn: Normal Step Over Obstacle: Normal Step Around Obstacles: Normal Steps: Mild Impairment Total Score: 23      Cognition Arousal/Alertness: Awake/alert Behavior During Therapy: WFL for tasks assessed/performed Overall Cognitive Status: Within Functional Limits for tasks assessed                                          Exercises      General Comments General comments (skin integrity, edema, etc.): on 3L O2 at rest, initially on RA with mobility with SpO2 dropping to 87% so placed on 3L for ambulation as well.  Patient relates may need new heart pump as EF below 20%.      Pertinent Vitals/Pain Pain Assessment Pain Assessment: No/denies pain    Home Living                          Prior Function  PT Goals (current goals can now be found in the care plan section) Progress towards PT goals: Goals met/education completed, patient discharged from PT    Frequency    Min 4X/week      PT Plan Discharge plan needs to be updated    Co-evaluation              AM-PAC PT "6 Clicks" Mobility   Outcome Measure  Help needed turning from your back to your side while in a flat bed without using bedrails?: None Help needed moving from lying on your back to sitting on the side of a flat bed without using bedrails?: None Help needed moving to and from a bed to a chair (including a wheelchair)?: None Help needed standing up from a chair using your arms (e.g.,  wheelchair or bedside chair)?: None Help needed to walk in hospital room?: None Help needed climbing 3-5 steps with a railing? : None 6 Click Score: 24    End of Session Equipment Utilized During Treatment: Gait belt;Oxygen Activity Tolerance: Patient tolerated treatment well Patient left: in bed;with call bell/phone within reach   PT Visit Diagnosis: Other abnormalities of gait and mobility (R26.89)     Time: 6962-9528 PT Time Calculation (min) (ACUTE ONLY): 23 min  Charges:  $Gait Training: 8-22 mins $Neuromuscular Re-education: 8-22 mins                     Sheran Lawless, PT Acute Rehabilitation Services Office:[phone] 07/12/2022    Charles Holmes 07/12/2022, 4:44 PM

## 2022-07-13 ENCOUNTER — Other Ambulatory Visit: Payer: Self-pay

## 2022-07-13 DIAGNOSIS — I639 Cerebral infarction, unspecified: Secondary | ICD-10-CM | POA: Diagnosis not present

## 2022-07-13 DIAGNOSIS — I255 Ischemic cardiomyopathy: Secondary | ICD-10-CM | POA: Diagnosis not present

## 2022-07-13 DIAGNOSIS — I5023 Acute on chronic systolic (congestive) heart failure: Secondary | ICD-10-CM | POA: Diagnosis not present

## 2022-07-13 LAB — CBC
HCT: 32.5 % — ABNORMAL LOW (ref 39.0–52.0)
Hemoglobin: 10.2 g/dL — ABNORMAL LOW (ref 13.0–17.0)
MCH: 32.8 pg (ref 26.0–34.0)
MCHC: 31.4 g/dL (ref 30.0–36.0)
MCV: 104.5 fL — ABNORMAL HIGH (ref 80.0–100.0)
Platelets: 146 10*3/uL — ABNORMAL LOW (ref 150–400)
RBC: 3.11 MIL/uL — ABNORMAL LOW (ref 4.22–5.81)
RDW: 14.6 % (ref 11.5–15.5)
WBC: 11.1 10*3/uL — ABNORMAL HIGH (ref 4.0–10.5)
nRBC: 0 % (ref 0.0–0.2)

## 2022-07-13 LAB — MAGNESIUM: Magnesium: 2 mg/dL (ref 1.7–2.4)

## 2022-07-13 LAB — BASIC METABOLIC PANEL
Anion gap: 7 (ref 5–15)
BUN: 26 mg/dL — ABNORMAL HIGH (ref 6–20)
CO2: 35 mmol/L — ABNORMAL HIGH (ref 22–32)
Calcium: 8.5 mg/dL — ABNORMAL LOW (ref 8.9–10.3)
Chloride: 92 mmol/L — ABNORMAL LOW (ref 98–111)
Creatinine, Ser: 1.26 mg/dL — ABNORMAL HIGH (ref 0.61–1.24)
GFR, Estimated: >60 mL/min (ref >60)
Glucose, Bld: 95 mg/dL (ref 70–99)
Potassium: 4.1 mmol/L (ref 3.5–5.1)
Sodium: 134 mmol/L — ABNORMAL LOW (ref 135–145)

## 2022-07-13 MED ORDER — SODIUM CHLORIDE 0.9% FLUSH
3.0000 mL | Freq: Two times a day (BID) | INTRAVENOUS | Status: DC
  Administered 2022-07-14: 3 mL via INTRAVENOUS

## 2022-07-13 MED ORDER — POLYETHYLENE GLYCOL 3350 17 G PO PACK: 17.0000 g | PACK | Freq: Every day | ORAL | Status: DC | PRN

## 2022-07-13 MED ORDER — SODIUM CHLORIDE 0.9 % IV SOLN: INTRAVENOUS | Status: DC

## 2022-07-13 MED ORDER — SODIUM CHLORIDE 0.9% FLUSH: 3.0000 mL | INTRAVENOUS | Status: DC | PRN

## 2022-07-13 MED ORDER — SODIUM CHLORIDE 0.9 % IV SOLN: 250.0000 mL | INTRAVENOUS | Status: DC | PRN

## 2022-07-13 NOTE — Plan of Care (Signed)
  Problem: Education: Goal: Understanding of CV disease, CV risk reduction, and recovery process will improve Outcome: Progressing   Problem: Education: Goal: Knowledge of disease or condition will improve Outcome: Progressing Goal: Knowledge of secondary prevention will improve (MUST DOCUMENT ALL) Outcome: Progressing Goal: Knowledge of patient specific risk factors will improve Loraine Leriche N/A or DELETE if not current risk factor) Outcome: Progressing   Problem: Ischemic Stroke/TIA Tissue Perfusion: Goal: Complications of ischemic stroke/TIA will be minimized Outcome: Progressing

## 2022-07-13 NOTE — Progress Notes (Addendum)
SLP Cancellation Note  Patient Details Name: DEMARKIS GHEEN MRN: [id number] DOB: November 15, 1965   Cancelled treatment:       Reason Eval/Treat Not Completed: Patient at procedure or test/unavailable (Pt currently in discussion with APP. SLP will follow up later as schedule allows.)  Charnette Younkin I. Vear Clock, MS, CCC-SLP Neuro Diagnostic Specialist  Acute Rehabilitation Services Office number: [phone]   Scheryl Marten 07/13/2022, 11:51 AM

## 2022-07-13 NOTE — Care Management Important Message (Signed)
Important Message  Patient Details  Name: Charles Holmes MRN: [id number] Date of Birth: [birth year]   Medicare Important Message Given:  Yes     Julianne Chamberlin 07/13/2022, 2:16 PM

## 2022-07-13 NOTE — Progress Notes (Addendum)
Advanced Heart Failure Rounding Note  PCP-Cardiologist: Norman Herrlich, MD  HF: Dr. Gala Romney  Subjective:    2L in UOP yesterday. Wt trending down.   Scr 1.15>>1.26. K 4.1, Mg 2.0   SBPs soft, 90s-low 100s. No orthostatic symptoms. Breathing improved. Appeitite is good. Not sleeping well.     Objective:    Echo 07/12/22:  EF < 20%, grade III DD, RV okay, moderate pericardial effusion, moderate MR, moderate TR  Weight Range: 79 kg Body mass index is 27.28 kg/m.   Vital Signs:   Temp:  [98.1 F (36.7 C)-98.7 F (37.1 C)] 98.4 F (36.9 C) (06/27 0400) Pulse Rate:  [73-108] 73 (06/27 0400) Resp:  [16-25] 17 (06/27 0400) BP: (88-108)/(61-80) 104/70 (06/27 0400) SpO2:  [89 %-98 %] 96 % (06/27 0400) Weight:  [79 kg] 79 kg (06/27 0446) Last BM Date :  (PTA)  Weight change: Filed Weights   07/12/22 0500 07/13/22 0446  Weight: 82.1 kg 79 kg    Intake/Output:   Intake/Output Summary (Last 24 hours) at 07/13/2022 0723 Last data filed at 07/13/2022 0400 Gross per 24 hour  Intake 720 ml  Output 1950 ml  Net -1230 ml      Physical Exam    General:  Well appearing. No respiratory difficulty HEENT: normal Neck: supple.  JVD 8-9 cm. Carotids 2+ bilat; no bruits. No lymphadenopathy or thyromegaly appreciated. Cor: PMI nondisplaced. Regular rate & rhythm. No rubs, gallops or murmurs. Lungs: faint crackles RLL otherwise CTA  Abdomen: obese, soft, nontender, nondistended. No hepatosplenomegaly. No bruits or masses. Good bowel sounds. Extremities: no cyanosis, clubbing, rash, edema Neuro: alert & oriented x 3, cranial nerves grossly intact. moves all 4 extremities w/o difficulty. Affect pleasant.   Telemetry   NSR 80s   Labs    CBC Recent Labs    07/11/22 0209 07/11/22 0318 07/13/22 0014  WBC 9.9  --  11.1*  HGB 11.3* 11.9* 10.2*  HCT 35.3* 35.0* 32.5*  MCV 104.4*  --  104.5*  PLT 163  --  146*   Basic Metabolic Panel Recent Labs    16/10/96 0504  07/12/22 0925 07/13/22 0014  NA 138 136 134*  K 5.5* 4.5 4.1  CL 95* 96* 92*  CO2 33* 30 35*  GLUCOSE 118* 107* 95  BUN 25* 24* 26*  CREATININE 1.33* 1.15 1.26*  CALCIUM 8.9 9.0 8.5*  MG 2.2  --  2.0   Liver Function Tests Recent Labs    07/11/22 0209  AST 25  ALT 25  ALKPHOS 79  BILITOT 2.4*  PROT 6.1*  ALBUMIN 3.5   No results for input(s): "LIPASE", "AMYLASE" in the last 72 hours. Cardiac Enzymes No results for input(s): "CKTOTAL", "CKMB", "CKMBINDEX", "TROPONINI" in the last 72 hours.  BNP: BNP (last 3 results) Recent Labs    11/07/21 1457 07/11/22 0209  BNP 284.0* 1,588.2*    ProBNP (last 3 results) No results for input(s): "PROBNP" in the last 8760 hours.   D-Dimer No results for input(s): "DDIMER" in the last 72 hours. Hemoglobin A1C Recent Labs    07/11/22 0655  HGBA1C 5.1   Fasting Lipid Panel Recent Labs    07/11/22 0209  CHOL 102  HDL 33*  LDLCALC 59  TRIG 50  CHOLHDL 3.1   Thyroid Function Tests Recent Labs    07/11/22 0209  TSH 0.974    Other results:   Imaging    MR BRAIN WO CONTRAST  Result Date: 07/12/2022 CLINICAL DATA:  Stroke, follow-up. EXAM: MRI HEAD WITHOUT CONTRAST TECHNIQUE: Multiplanar, multiecho pulse sequences of the brain and surrounding structures were obtained without intravenous contrast. COMPARISON:  Head CT July 11, 2022. FINDINGS: Limited study due to patient inability to tolerate lying still in the scanner. Only diffusion-weighted images were obtained. Area of restricted diffusion involving anterior/lateral aspect of the right temporal lobe and posterior insula. Additional areas of restricted diffusion within the right putamen and right caudate body. Findings are consistent with subacute right MCA territory infarct. No significant mass effect. No hydrocephalus, extra-axial collection or mass lesion. IMPRESSION: Limited study showing subacute right MCA territory infarct involving the right temporal lobe,  insula and basal ganglia. Electronically Signed   By: Baldemar Lenis M.D.   On: 07/12/2022 14:53   MR ANGIO HEAD WO CONTRAST  Result Date: 07/12/2022 CLINICAL DATA:  Stroke.  Follow-up. EXAM: MRA HEAD WITHOUT CONTRAST TECHNIQUE: Angiographic images of the Circle of Willis were acquired using MRA technique without intravenous contrast. COMPARISON:  Head CT yesterday. CT studies 07/10/2022 FINDINGS: Anterior circulation: Presently, the anterior circulation has normal appearance. There is restoration of flow in the right MCA branch vessels. No large or medium vessel occlusion or flow limiting proximal stenosis is identified. Left anterior circulation is normal. Posterior circulation: Right vertebral artery terminates in PICA. Dominant left vertebral artery supplies the basilar artery. No basilar stenosis. Posterior circulation branch vessels are normal. Fetal origin of the right PCA Anatomic variants: None other significant. Other: None. IMPRESSION: No large or medium vessel occlusion or flow limiting proximal stenosis. Restoration of flow in the right MCA branch vessels. Electronically Signed   By: Paulina Fusi M.D.   On: 07/12/2022 13:24     Medications:     Scheduled Medications:  aspirin EC  325 mg Oral Daily   atorvastatin  80 mg Oral Daily   Chlorhexidine Gluconate Cloth  6 each Topical Daily   digoxin  0.125 mg Oral Daily   folic acid  1 mg Oral Daily   [START ON 07/15/2022] methotrexate  20 mg Oral Weekly   mexiletine  250 mg Oral Q12H    Infusions:  ferumoxytol (FERAHEME) 510 mg in sodium chloride 0.9 % 100 mL IVPB      PRN Medications: mouth rinse, senna-docusate    Assessment/Plan   1. Acute Stroke - Admitted with acute L sided weakness, CTA revealed an acute right M1 occlusion. S/p TPA and mechanical clot extraction. No residual deficits  -Likely cardioembolic  in setting of severe LV dysfunction, will eventually need DOAC post cath  -No AF on tele/ device  check. Reviewed with [employer] rep.    2. Acute on Chronic Systolic HF - Diagnosed 11/2019. Presented with VT. LHC 70% LAD  - Echo EF 25-30%  - cMRI 12/21 concerning for sarcoid and EF 18%.  - PET 2/22 at [facility] EF 25% + active sarcoid - Echo 08/26/20 EF < 20% severely dilated LV RV mildly decreased.  - CPX 8/22. Unable to complete PFTs due to hypoventilation and poor effort. pVO2 16.1 (49% predicted). Slope 19 RER: 1.17 PETCO2 59 - CRT-D upgrade in 06/08/21 - Echo 11/07/21: EF 20-25% RV mild to moderately reduced.  - Echo 07/10/22: EF <20%, RV okay, mod pericardial effusion, mod MR/TR - NYHA IIIb, appears volume up. Improving w/ diuresis. Give another 80 mg lasix IV again today. May need Diamox  - GDMT on hold with soft BP following recent CVA - continue digoxin 0.125 mcg daily - Hgb A1c 5.1, plan  to add back SGLT2i prior to discharge - Plan for RHC on 06/28 - Suspect will need to consider VAD/transplant workup in near future   3. 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 Continue mexilitene  - now s/p ICD.  - Keep K > 4.0 Mg > 2.0  4. CAD - LHC 12/07/19 70-% LAD, no intervention - No s/s ischemia, denies CP - Continue statin and aspirin.  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 Jeralyn Ruths at [facility].  - Has completed prednisone. Now on MTX 20 weekly. Continue folic acid. - We discussed EM biopsy but patient is reluctant  - Needs repeat PET, wasn't able to schedule 10/23 as insurance would not cover. Now has medicaid and medicare   6. Pericardial effusion - Mod on echo this admit - will follow  7. Iron deficiency anemia - T sat 13% - Would benefit from feraheme prior to discharge. Wait until after MRIs completed.  8. Noncompliance -Missed some doses of medications prior to admission after running out. -Forgets meds at times, stopped jardiance d/t cost.  -Reviewed with PharmD. Now has medicare and medicaid.  Cost should no longer be an issue    Length of Stay: 3  Brittainy Simmons, PA-C  07/13/2022, 7:23 AM  Advanced Heart Failure Team Pager [phone] (M-F; 7a - 5p)  Please contact [facility] for night-coverage after hours (5p -7a ) and weekends on [email or web address]  Patient seen and examined with the above-signed Advanced Practice Provider and/or Housestaff. I personally reviewed laboratory data, imaging studies and relevant notes. I independently examined the patient and formulated the important aspects of the plan. I have edited the note to reflect any of my changes or salient points. I have personally discussed the plan with the patient and/or family.  Has diuresed well. Denies CP or SOB   General:  Sitting in chair No resp difficulty HEENT: normal Neck: supple. JVP 7-8. Carotids 2+ bilat; no bruits. No lymphadenopathy or thryomegaly appreciated. Cor: PMI nondisplaced. Regular rate & rhythm. No rubs, gallops or murmurs. Lungs: clear Abdomen: soft, nontender, nondistended. No hepatosplenomegaly. No bruits or masses. Good bowel sounds. Extremities: no cyanosis, clubbing, rash, edema Neuro: alert & orientedx3, cranial nerves grossly intact. moves all 4 extremities w/o difficulty. Affect pleasant  Volume status looks much better BP soft but stable.   Plan RHC tomorrow. Start Eliquis when ok with Neuro.  Previously considered for advanced HF therapies but lung function was limiting based on CPX results 2022. F/u PFT were slightly better. Can revisit candidacy for advanced HF therapies as outpatient.  Arvilla Meres, MD  3:21 PM

## 2022-07-13 NOTE — Progress Notes (Signed)
Mobility Specialist Progress Note:   07/13/22 1126  Mobility  Activity Ambulated with assistance in hallway  Level of Assistance Standby assist, set-up cues, supervision of patient - no hands on  Assistive Device None  Distance Ambulated (ft) 400 ft  Activity Response Tolerated well  Mobility Referral Yes  $Mobility charge 1 Mobility  Mobility Specialist Start Time (ACUTE ONLY) 1047  Mobility Specialist Stop Time (ACUTE ONLY) 1055  Mobility Specialist Time Calculation (min) (ACUTE ONLY) 8 min   Pt received in chair agreeable to ambulate. Pt needed no physical assistance throughout session. Pt received on 2L/min but d/t pt having no SOB or desat in SPO2, MS attempted to ambulate on RA, VSS (see below). Pt left off O2 but in reach in case pt feels it is needed. Returned to chair, call bell in hand, all needs met.  Pre Mobility HR 88 SPO2 98% (2L/min) During Mobility HR 113 SPO2 91 (RA) Post Mobility HR 109 SPO2 92% (RA)  Thompson Grayer Mobility Specialist  Please contact vis Secure Chat or  Rehab Office [phone]

## 2022-07-13 NOTE — Progress Notes (Signed)
Occupational Therapy Treatment/Discharge Patient Details Name: Charles Holmes MRN: [id number] DOB: [birth year] Today's Date: 07/13/2022   History of present illness 57 yo male s/p trombectomy R MCA PMH CAD, CHF, HLD, HTN, systolic HF, ICD implant, R & L heart cath   OT comments  Pt has met OT goals. Pt able to demo ADLs/mobility in room Independently. Discussed pt IADL routine in detail, as well as CHF mgmt strategies with pt demonstrating good insight. Pt denies any concerns with daily routine at home and no further skilled OT services needed acutely or on DC. Encouraged continued OOB activities with mobility specialists during admission.   Recommendations for follow up therapy are one component of a multi-disciplinary discharge planning process, led by the attending physician.  Recommendations may be updated based on patient status, additional functional criteria and insurance authorization.    Assistance Recommended at Discharge PRN  Patient can return home with the following  Assist for transportation   Equipment Recommendations  None recommended by OT    Recommendations for Other Services      Precautions / Restrictions Precautions Precautions: None Restrictions Weight Bearing Restrictions: No       Mobility Bed Mobility               General bed mobility comments: in recliner on entry    Transfers Overall transfer level: Independent Equipment used: None Transfers: Sit to/from Stand Sit to Stand: Independent                 Balance Overall balance assessment: Independent                                         ADL either performed or assessed with clinical judgement   ADL Overall ADL's : Independent     Grooming: Independent;Standing;Oral care;Brushing hair;Wash/dry face               Lower Body Dressing: Independent;Sit to/from stand;Sitting/lateral leans               Functional mobility during ADLs:  Independent General ADL Comments: Discussed ADL routine, IADL routine (including med mgmt, caring for cat, meal prep - mostly using air fryer). collaborated on use of scale, pt aware of weight gain numbers and when to take fluid pills; discussed lower sodium food w/ pt reporting already avoiding salt majority of meals    Extremity/Trunk Assessment Upper Extremity Assessment Upper Extremity Assessment: Overall WFL for tasks assessed   Lower Extremity Assessment Lower Extremity Assessment: Defer to PT evaluation        Vision   Vision Assessment?: No apparent visual deficits   Perception     Praxis      Cognition Arousal/Alertness: Awake/alert Behavior During Therapy: WFL for tasks assessed/performed Overall Cognitive Status: Within Functional Limits for tasks assessed                                          Exercises      Shoulder Instructions       General Comments SpO2 95% on RA, BP soft but stable    Pertinent Vitals/ Pain       Pain Assessment Pain Assessment: No/denies pain  Home Living  Prior Functioning/Environment              Frequency           Progress Toward Goals  OT Goals(current goals can now be found in the care plan section)  Progress towards OT goals: Goals met and updated - see care plan  Acute Rehab OT Goals Patient Stated Goal: home soon OT Goal Formulation: All assessment and education complete, DC therapy  Plan All goals met and education completed, patient discharged from OT services    Co-evaluation                 AM-PAC OT "6 Clicks" Daily Activity     Outcome Measure   Help from another person eating meals?: None Help from another person taking care of personal grooming?: None Help from another person toileting, which includes using toliet, bedpan, or urinal?: None Help from another person bathing (including washing, rinsing,  drying)?: None Help from another person to put on and taking off regular upper body clothing?: None Help from another person to put on and taking off regular lower body clothing?: None 6 Click Score: 24    End of Session    OT Visit Diagnosis: Unsteadiness on feet (R26.81)   Activity Tolerance Patient tolerated treatment well   Patient Left in chair;with call bell/phone within reach   Nurse Communication          Time: 4696-2952 OT Time Calculation (min): 20 min  Charges: OT General Charges $OT Visit: 1 Visit OT Treatments $Self Care/Home Management : 8-22 mins  Bradd Canary, OTR/L Acute Rehab Services Office: [phone]   Lorre Munroe 07/13/2022, 10:31 AM

## 2022-07-13 NOTE — Anesthesia Postprocedure Evaluation (Signed)
Anesthesia Post Note  Patient: Charles Holmes  Procedure(s) Performed: RADIOLOGY WITH ANESTHESIA     Patient location during evaluation: Nursing Unit Anesthesia Type: General Level of consciousness: awake and alert, oriented and patient cooperative Pain management: pain level controlled Vital Signs Assessment: post-procedure vital signs reviewed and stable Respiratory status: spontaneous breathing, nonlabored ventilation and respiratory function stable Cardiovascular status: blood pressure returned to baseline and stable Postop Assessment: no apparent nausea or vomiting, adequate PO intake and able to ambulate Anesthetic complications: no Comments: Pt recovering well   No notable events documented.  Last Vitals:  Vitals:   07/13/22 0758 07/13/22 1200  BP: 100/64   Pulse: 86   Resp: 17   Temp: 37 C 37.1 C  SpO2: 97%     Last Pain:  Vitals:   07/13/22 1200  TempSrc: Oral  PainSc:                  Kenyona Rena,E. Aliyana Dlugosz

## 2022-07-13 NOTE — Progress Notes (Addendum)
STROKE TEAM PROGRESS NOTE   BRIEF HPI [name] is a 57 y.o. male with history of CAD, systolic heart failure secondary to possible cardiac sarcoidosis, wide complex tachycardia, ICD implant, HLD and HTN who presented to [facility] this evening with acute onset of left sided weakness. [name] was 7:30 PM. BP prior to arrival was in the low range at 104/56 per EMS. CT head was negative for acute abnormality with ASPECTS of 10. Teleneurology was consulted and he was deemed to be a tPA candidate, with tPA infusion started at [facility]. CTA revealed an acute right M1 occlusion and he was emergently transferred to [facility] for a mechanical thrombectomy.   SIGNIFICANT HOSPITAL EVENTS 6/24 - Mechanical thrombectomy  6/25- CT at 2000  INTERM HISTORY/SUBJECTIVE MRI performed yesterday showing subacute MCA territory infarct Patient is transferred out of the ICU to cardiac progressive Awaiting cardiac cath tomorrow  OBJECTIVE  CBC    Component Value Date/Time   WBC 11.1 (H) 07/13/2022 0014   RBC 3.11 (L) 07/13/2022 0014   HGB 10.2 (L) 07/13/2022 0014   HGB 14.3 05/31/2021 0906   HCT 32.5 (L) 07/13/2022 0014   HCT 42.6 05/31/2021 0906   PLT 146 (L) 07/13/2022 0014   PLT 203 05/31/2021 0906   MCV 104.5 (H) 07/13/2022 0014   MCV 98 (H) 05/31/2021 0906   MCH 32.8 07/13/2022 0014   MCHC 31.4 07/13/2022 0014   RDW 14.6 07/13/2022 0014   RDW 15.8 (H) 05/31/2021 0906   LYMPHSABS 0.9 05/31/2021 0906   MONOABS 0.8 02/03/2020 1520   EOSABS 0.1 05/31/2021 0906   BASOSABS 0.0 05/31/2021 0906    BMET    Component Value Date/Time   NA 134 (L) 07/13/2022 0014   NA 143 05/31/2021 0906   K 4.1 07/13/2022 0014   CL 92 (L) 07/13/2022 0014   CO2 35 (H) 07/13/2022 0014   GLUCOSE 95 07/13/2022 0014   BUN 26 (H) 07/13/2022 0014   BUN 12 05/31/2021 0906   CREATININE 1.26 (H) 07/13/2022 0014   CALCIUM 8.5 (L) 07/13/2022 0014   EGFR 80 05/31/2021 0906   GFRNONAA >60 07/13/2022 0014    IMAGING past 24 hours No  results found.  Vitals:   07/13/22 0400 07/13/22 0446 07/13/22 0758 07/13/22 1200  BP: 104/70  100/64   Pulse: 73  86   Resp: 17  17   Temp: 98.4 F (36.9 C)  98.6 F (37 C) 98.7 F (37.1 C)  TempSrc: Oral  Oral Oral  SpO2: 96%  97%   Weight:  79 kg       PHYSICAL EXAM General:  Alert, well-nourished, well-developed patient in no acute distress Psych:  Mood and affect appropriate for situation CV: Regular rate and rhythm on monitor Respiratory:  Regular, unlabored respirations on room air GI: Abdomen soft and nontender   NEURO:  Mental Status: AA&Ox3, patient is able to give clear and coherent history Speech/Language: speech is without dysarthria or aphasia.  Naming, repetition, fluency, and comprehension intact.  Cranial Nerves:  II: PERRL. Visual fields full.  III, IV, VI: EOMI. Eyelids elevate symmetrically.  V: Sensation is intact to light touch and symmetrical to face.  VII: Subtle left facial droop VIII: hearing intact to voice. IX, X:Phonation is normal.  XII: tongue is midline without fasciculations. Motor: 5/5 strength to all muscle groups tested.  Tone: is normal and bulk is normal Sensation- Intact to light touch bilaterally Coordination: FTN intact bilaterally- mild asterixis bilaterally.No drift.  Gait- deferred  ASSESSMENT/PLAN  Acute Ischemic Infarct: Right MCA infarct with right M1 occlusion s/p tPA and IR with TICI3, likely due to cardiomyopathy with low EF Code Stroke CT head no acute abnormality  CTA head & neck right distal M1 occlusion  IR with proximal right M3 occlusion and TICI3 perfusion Post IR CT no hemorrhage Repeat CT mild hypodensity in right anterior temporal lobe and insula, no evidence of hemorrhagic transformation or significant midline shift MRI right temporal lobe, insular cortex and BG infarcts. MRA head right MCA patent 2D Echo <20% ICD interrogation no A-fib LDL 59 HgbA1c 5.1 VTE prophylaxis - SCDs aspirin 81 mg daily  prior to admission, now on aspirin 325 mg daily. Will consider DOAC after RHC tomorrow Therapy recommendations: Outpatient OT, no PT or SLP Disposition:  pending   Chronic Systolic Heart Failure Follows with HF team as outpt Current echo showed EF < 20% HF team consulted Put on digoxin S/p lasix x 2 GDMT on hold given soft BP RHC tomorrow  Hx of hypertension Hypotension One time dose of lasix per cardiology  GDMT on hold  BP 90-100s Close monitoring  Cardiac Sarcoidosis Hx of VT Continue mexilitene ICD interrogation no afib  CAD Home meds: Coreg 3.125, Lasix 40mg , Lostartan 25mg , Aldactone 12.5, Farxiga 10mg , mexilitene Hypotensive Cardiology consulted, appreciate help Follows with Dr. Gala Romney who saw him inpatient as well - recommends oral anticoagulation  - Cardiac MRI - Digoxin 0.151mcg - Hold SGLT2i until closer to discharge  Hyperlipidemia Home meds:  Atorvastatin 80mg  LDL 59, goal < 70 On lipitor 80 Continue statin at discharge  Diabetes type II Controlled Home meds:  Farxiga HgbA1c 5.1, goal < 7.0 CBGs SSI Recommend close follow-up with PCP for better DM control  Other Stroke Risk Factors Obstructive sleep apnea CO2 retention, used bipap overnight inpatient Need outpt sleep study done   Hospital day # 3  Patient seen and examined by NP/APP with MD. MD to update note as needed.   Cortney E Ernestina Columbia , MSN, AGACNP-BC [facility] See Amion for schedule and pager information 07/13/2022 1:00 PM  ATTENDING NOTE: I reviewed above note and agree with the assessment and plan. Pt was seen and examined.   No family at the bedside. Pt lying in bed, neuro intact, no acute event overnight. Still has soft BP, GDMT on hold, on digoxin, plan to add [place] prior to discharge.  Pending right heart cath tomorrow.  Continue aspirin for now, consider DOAC after cath.  Continue statin.  For detailed assessment and plan, please refer to above/below  as I have made changes wherever appropriate.   Marvel Plan, MD PhD Stroke Neurology 07/13/2022 6:40 PM

## 2022-07-13 NOTE — Evaluation (Signed)
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   Status post surgery 07/10/2022   Chronic systolic heart failure (HCC) 02/21/2020   Ventricular tachycardia (HCC) 02/19/2020   Ventricular tachycardia, sustained (HCC) 02/19/2020   AKI (acute kidney injury) (HCC) 12/19/2019   CAD (coronary artery disease) 12/19/2019   Hyperlipidemia with target LDL less than 70 12/19/2019   Wide-complex tachycardia 12/16/2019   Acute systolic CHF (congestive heart failure) (HCC) 12/16/2019   HTN (hypertension) 12/16/2019   Past Medical History:  Past Medical History:  Diagnosis Date   CAD (coronary artery disease)    CHF (congestive heart failure) (HCC)    GERD (gastroesophageal reflux disease)    Hyperlipidemia    Hypertension    Systolic heart failure (HCC) 2021   LVEF 18%, RVEF 38% on cardiac MRI 12/19/2019. possible cardiac sarcoidosis.   Wide-complex tachycardia 2021   wears LifeVest   Past Surgical History:  Past Surgical History:  Procedure Laterality Date   BIV UPGRADE N/A 06/07/2021   Procedure: BIV ICD UPGRADE;  Surgeon: Charles Lemming, MD;  Location: [facility] INVASIVE CV LAB;  Service: Cardiovascular;  Laterality: N/A;   ICD IMPLANT N/A 02/20/2020   Procedure: ICD IMPLANT;  Surgeon: Charles Lemming, MD;  Location: [facility] INVASIVE CV LAB;  Service: Cardiovascular;  Laterality: N/A;   IR CT HEAD LTD  07/10/2022   IR PERCUTANEOUS ART THROMBECTOMY/INFUSION INTRACRANIAL INC DIAG ANGIO  07/10/2022   IR US GUIDE VASC ACCESS RIGHT  07/10/2022   RADIOLOGY WITH ANESTHESIA N/A 07/10/2022   Procedure: RADIOLOGY WITH ANESTHESIA;  Surgeon: Radiologist, Medication, MD;  Location: [facility] OR;  Service: Radiology;  Laterality: N/A;   RIGHT/LEFT HEART CATH AND  CORONARY ANGIOGRAPHY N/A 12/16/2019   Procedure: RIGHT/LEFT HEART CATH AND CORONARY ANGIOGRAPHY;  Surgeon: Holmes, Charles M, MD;  Location: [facility] INVASIVE CV LAB;  Service: Cardiovascular;  Laterality: N/A;   HPI:  Pt is a 57 y.o. male who presented with acute onset of left sided weakness. Pt s/p trombectomy R MCA 6/24. MRI brain 6/26: subacute right MCA territory infarct involving  the right temporal lobe, insula and basal ganglia.  PMH: CAD, GERD, systolic heart failure secondary to possible cardiac sarcoidosis, wide complex tachycardia, ICD implant, HLD and HTN.   Assessment / Plan / Recommendation Clinical Impression  Pt participated in speech-language-cognition evaluation. Pt reported having some age-related memory difficulty, but stated that it not alarming/atypical for his age, and that he remains independent with medication and financial management while living alone. [facility] Mental Status Examination was completed to evaluate the pt's cognitive-linguistic skills. He achieved a score of 27/30 which is within the normal limits of 27 or more out of 30 and points were only lost on tasks which required delayed recall. No speech or language deficits were noted and his performance on informal cognitive-linguistic tasks was within functional limits. Further skilled SLP services are not clinically indicated at this time. Pt and his family were educated regarding this and all parties verbalized understanding as well as agreement with plan of care.    SLP Assessment  SLP Recommendation/Assessment: Patient does not need any further Speech Lanaguage Pathology Services SLP Visit Diagnosis: Cognitive communication deficit (R41.841)    Recommendations for follow up therapy are one component of a multi-disciplinary discharge planning  process, led by the attending physician.  Recommendations may be updated based on patient status, additional functional criteria and insurance authorization.     Follow Up Recommendations  No SLP follow up    Assistance Recommended at Discharge  None  Functional Status Assessment Patient has not had a recent decline in their functional status  Frequency and Duration           SLP Evaluation Cognition  Overall Cognitive Status: Within Functional Limits for tasks assessed Arousal/Alertness: Awake/alert Orientation Level: Oriented X4 Year: 2024 Month: June Day of Week: Correct Attention: Selective Selective Attention: Appears intact Memory: Impaired Memory Impairment:  (Immediate: 5/5; delayed: 4/5; with cue: 1/1) Awareness: Appears intact Problem Solving:  (Money: 3/3; time: 1/1) Executive Function: Sequencing;Organizing Sequencing: Appears intact (Holmes drawing: 4/4) Organizing: Appears intact (backward digit span: 2/2)       Comprehension  Auditory Comprehension Overall Auditory Comprehension: Appears within functional limits for tasks assessed Yes/No Questions: Within Functional Limits Commands: Within Functional Limits    Expression Expression Primary Mode of Expression: Verbal Verbal Expression Overall Verbal Expression: Appears within functional limits for tasks assessed Initiation: No impairment Level of Generative/Spontaneous Verbalization: Conversation Repetition: No impairment Naming: No impairment   Oral / Motor  Oral Motor/Sensory Function Overall Oral Motor/Sensory Function: Within functional limits Motor Speech Overall Motor Speech: Appears within functional limits for tasks assessed Respiration: Within functional limits Phonation: Normal Resonance: Within functional limits Articulation: Within functional limitis Intelligibility: Intelligible Motor Planning: Witnin functional limits           Charles Holmes I. Charles Clock, MS, CCC-SLP Neuro Diagnostic Specialist  Acute Rehabilitation Services Office number: [phone]  Charles Holmes 07/13/2022, 12:21 PM

## 2022-07-14 ENCOUNTER — Encounter (HOSPITAL_COMMUNITY)
Admission: EM | Disposition: A | Discharge status: Home / Self Care | Payer: Self-pay | Source: Other Acute Inpatient Hospital | Attending: Neurology

## 2022-07-14 DIAGNOSIS — I5023 Acute on chronic systolic (congestive) heart failure: Secondary | ICD-10-CM | POA: Diagnosis not present

## 2022-07-14 DIAGNOSIS — I5041 Acute combined systolic (congestive) and diastolic (congestive) heart failure: Secondary | ICD-10-CM

## 2022-07-14 HISTORY — PX: RIGHT HEART CATH: CATH118263

## 2022-07-14 LAB — POCT I-STAT EG7
Acid-Base Excess: 8 mmol/L — ABNORMAL HIGH (ref 0.0–2.0)
Bicarbonate: 34.8 mmol/L — ABNORMAL HIGH (ref 20.0–28.0)
Calcium, Ion: 1.13 mmol/L — ABNORMAL LOW (ref 1.15–1.40)
HCT: 33 % — ABNORMAL LOW (ref 39.0–52.0)
Hemoglobin: 11.2 g/dL — ABNORMAL LOW (ref 13.0–17.0)
O2 Saturation: 58 %
Potassium: 3.7 mmol/L (ref 3.5–5.1)
Sodium: 138 mmol/L (ref 135–145)
TCO2: 37 mmol/L — ABNORMAL HIGH (ref 22–32)
pCO2, Ven: 59.9 mmHg (ref 44–60)
pH, Ven: 7.372 (ref 7.25–7.43)
pO2, Ven: 32 mmHg (ref 32–45)

## 2022-07-14 LAB — BASIC METABOLIC PANEL
Anion gap: 8 (ref 5–15)
BUN: 25 mg/dL — ABNORMAL HIGH (ref 6–20)
CO2: 32 mmol/L (ref 22–32)
Calcium: 8.4 mg/dL — ABNORMAL LOW (ref 8.9–10.3)
Chloride: 94 mmol/L — ABNORMAL LOW (ref 98–111)
Creatinine, Ser: 1.1 mg/dL (ref 0.61–1.24)
GFR, Estimated: >60 mL/min (ref >60)
Glucose, Bld: 122 mg/dL — ABNORMAL HIGH (ref 70–99)
Potassium: 3.6 mmol/L (ref 3.5–5.1)
Sodium: 134 mmol/L — ABNORMAL LOW (ref 135–145)

## 2022-07-14 LAB — CBC
HCT: 32.9 % — ABNORMAL LOW (ref 39.0–52.0)
Hemoglobin: 10.6 g/dL — ABNORMAL LOW (ref 13.0–17.0)
MCH: 34.1 pg — ABNORMAL HIGH (ref 26.0–34.0)
MCHC: 32.2 g/dL (ref 30.0–36.0)
MCV: 105.8 fL — ABNORMAL HIGH (ref 80.0–100.0)
Platelets: 142 10*3/uL — ABNORMAL LOW (ref 150–400)
RBC: 3.11 MIL/uL — ABNORMAL LOW (ref 4.22–5.81)
RDW: 14.6 % (ref 11.5–15.5)
WBC: 10 10*3/uL (ref 4.0–10.5)
nRBC: 0 % (ref 0.0–0.2)

## 2022-07-14 LAB — MAGNESIUM: Magnesium: 2 mg/dL (ref 1.7–2.4)

## 2022-07-14 SURGERY — RIGHT HEART CATH
Anesthesia: LOCAL

## 2022-07-14 MED ORDER — FUROSEMIDE 10 MG/ML IJ SOLN: 80.0000 mg | Freq: Two times a day (BID) | INTRAMUSCULAR | Status: DC

## 2022-07-14 MED ORDER — SODIUM CHLORIDE 0.9% FLUSH
3.0000 mL | Freq: Two times a day (BID) | INTRAVENOUS | Status: DC
  Administered 2022-07-14 – 2022-07-16 (×5): 3 mL via INTRAVENOUS

## 2022-07-14 MED ORDER — HYDRALAZINE HCL 20 MG/ML IJ SOLN: 10.0000 mg | INTRAMUSCULAR | Status: AC | PRN

## 2022-07-14 MED ORDER — ONDANSETRON HCL 4 MG/2ML IJ SOLN: 4.0000 mg | Freq: Four times a day (QID) | INTRAMUSCULAR | Status: DC | PRN

## 2022-07-14 MED ORDER — APIXABAN 5 MG PO TABS
5.0000 mg | ORAL_TABLET | Freq: Two times a day (BID) | ORAL | Status: DC
  Administered 2022-07-14 – 2022-07-16 (×5): 5 mg via ORAL
  Filled 2022-07-14 (×5): qty 1

## 2022-07-14 MED ORDER — ACETAMINOPHEN 325 MG PO TABS: 650.0000 mg | ORAL_TABLET | ORAL | Status: DC | PRN

## 2022-07-14 MED ORDER — LIDOCAINE HCL (PF) 1 % IJ SOLN
INTRAMUSCULAR | Status: DC | PRN
  Administered 2022-07-14: 5 mL

## 2022-07-14 MED ORDER — FUROSEMIDE 10 MG/ML IJ SOLN
80.0000 mg | Freq: Two times a day (BID) | INTRAMUSCULAR | Status: DC
  Administered 2022-07-14 – 2022-07-16 (×4): 80 mg via INTRAVENOUS
  Filled 2022-07-14 (×4): qty 8

## 2022-07-14 MED ORDER — LIDOCAINE HCL (PF) 1 % IJ SOLN
INTRAMUSCULAR | Status: AC
  Filled 2022-07-14: qty 30

## 2022-07-14 MED ORDER — SODIUM CHLORIDE 0.9 % IV SOLN: 250.0000 mL | INTRAVENOUS | Status: DC | PRN

## 2022-07-14 MED ORDER — POTASSIUM CHLORIDE CRYS ER 20 MEQ PO TBCR
40.0000 meq | EXTENDED_RELEASE_TABLET | Freq: Once | ORAL | Status: AC
  Administered 2022-07-14: 40 meq via ORAL
  Filled 2022-07-14: qty 2

## 2022-07-14 MED ORDER — LABETALOL HCL 5 MG/ML IV SOLN: 10.0000 mg | INTRAVENOUS | Status: AC | PRN

## 2022-07-14 MED ORDER — SODIUM CHLORIDE 0.9% FLUSH: 3.0000 mL | INTRAVENOUS | Status: DC | PRN

## 2022-07-14 SURGICAL SUPPLY — 4 items
CATH BALLN WEDGE 5F 110CM (CATHETERS) IMPLANT
PACK CARDIAC CATHETERIZATION (CUSTOM PROCEDURE TRAY) ×1 IMPLANT
SHEATH GLIDE SLENDER 4/5FR (SHEATH) IMPLANT
TRANSDUCER W/STOPCOCK (MISCELLANEOUS) ×1 IMPLANT

## 2022-07-14 NOTE — H&P (View-Only) (Signed)
Advanced Heart Failure Rounding Note  PCP-Cardiologist: Norman Herrlich, MD  HF: Dr. Gala Romney  Subjective:    - UOP, weight up 1lb.   Scr 1.15>>1.26>>1.1. K 3.6, Mg 2.0   SBPs soft but improving this morning, 90s-110s. No orthostatic symptoms. Feels good this morning aside from not getting any sleep. Standing at the sink brushing his teeth and hair when I walked in. Cath lab staff walked in to get patient for his RHC today.   Objective:    Echo 07/12/22:  EF < 20%, grade III DD, RV okay, moderate pericardial effusion, moderate MR, moderate TR  Weight Range: 79.4 kg Body mass index is 27.42 kg/m.   Vital Signs:   Temp:  [97.9 F (36.6 C)-98.8 F (37.1 C)] 98.3 F (36.8 C) (06/28 0742) Resp:  [15-24] 22 (06/28 0742) BP: (99-117)/(71-78) 117/76 (06/28 0742) SpO2:  [93 %-99 %] 98 % (06/28 0742) Weight:  [79.4 kg] 79.4 kg (06/28 0536) Last BM Date : 07/13/22  Weight change: Filed Weights   07/12/22 0500 07/13/22 0446 07/14/22 0536  Weight: 82.1 kg 79 kg 79.4 kg   Intake/Output:   Intake/Output Summary (Last 24 hours) at 07/14/2022 0811 Last data filed at 07/14/2022 0416 Gross per 24 hour  Intake --  Output 800 ml  Net -800 ml    Physical Exam  General:  well appearing.  No respiratory difficulty HEENT: normal Neck: supple. JVD ~8 cm. Carotids 2+ bilat; no bruits. No lymphadenopathy or thyromegaly appreciated. Cor: PMI nondisplaced. Regular rate & rhythm. No rubs, gallops or murmurs. Lungs: clear Abdomen: soft, nontender, slightly distended. No hepatosplenomegaly. No bruits or masses. Good bowel sounds. Extremities: no cyanosis, clubbing, rash, +1 BLE edema  Neuro: alert & oriented x 3, cranial nerves grossly intact. moves all 4 extremities w/o difficulty. Affect pleasant.   Telemetry   NSR 80s, occasional PVCs (Personally reviewed)    Labs    CBC Recent Labs    07/13/22 0014 07/14/22 0004  WBC 11.1* 10.0  HGB 10.2* 10.6*  HCT 32.5* 32.9*  MCV  104.5* 105.8*  PLT 146* 142*   Basic Metabolic Panel Recent Labs    16/10/96 0014 07/14/22 0004  NA 134* 134*  K 4.1 3.6  CL 92* 94*  CO2 35* 32  GLUCOSE 95 122*  BUN 26* 25*  CREATININE 1.26* 1.10  CALCIUM 8.5* 8.4*  MG 2.0 2.0   Liver Function Tests No results for input(s): "AST", "ALT", "ALKPHOS", "BILITOT", "PROT", "ALBUMIN" in the last 72 hours.  No results for input(s): "LIPASE", "AMYLASE" in the last 72 hours. Cardiac Enzymes No results for input(s): "CKTOTAL", "CKMB", "CKMBINDEX", "TROPONINI" in the last 72 hours.  BNP: BNP (last 3 results) Recent Labs    11/07/21 1457 07/11/22 0209  BNP 284.0* 1,588.2*    ProBNP (last 3 results) No results for input(s): "PROBNP" in the last 8760 hours.   D-Dimer No results for input(s): "DDIMER" in the last 72 hours. Hemoglobin A1C No results for input(s): "HGBA1C" in the last 72 hours.  Fasting Lipid Panel No results for input(s): "CHOL", "HDL", "LDLCALC", "TRIG", "CHOLHDL", "LDLDIRECT" in the last 72 hours.  Thyroid Function Tests No results for input(s): "TSH", "T4TOTAL", "T3FREE", "THYROIDAB" in the last 72 hours.  Invalid input(s): "FREET3"   Other results:   Imaging    No results found.   Medications:     Scheduled Medications:  aspirin EC  325 mg Oral Daily   atorvastatin  80 mg Oral Daily   Chlorhexidine Gluconate  Cloth  6 each Topical Daily   digoxin  0.125 mg Oral Daily   folic acid  1 mg Oral Daily   [START ON 07/15/2022] methotrexate  20 mg Oral Weekly   mexiletine  250 mg Oral Q12H   sodium chloride flush  3 mL Intravenous Q12H    Infusions:  sodium chloride     sodium chloride      PRN Medications: sodium chloride, mouth rinse, polyethylene glycol, senna-docusate, sodium chloride flush    Assessment/Plan  1. Acute Stroke - Admitted with acute L sided weakness, CTA revealed an acute right M1 occlusion. S/p TPA and mechanical clot extraction. No residual deficits  -Likely  cardioembolic in setting of severe LV dysfunction, will eventually need DOAC post cath  -No AF on tele/ device check. Reviewed with [employer] rep.  2. Acute on Chronic Systolic HF - Diagnosed 11/2019. Presented with VT. LHC 70% LAD  - Echo EF 25-30%  - cMRI 12/21 concerning for sarcoid and EF 18%.  - PET 2/22 at [facility] EF 25% + active sarcoid - Echo 08/26/20 EF < 20% severely dilated LV RV mildly decreased.  - CPX 8/22. Unable to complete PFTs due to hypoventilation and poor effort. pVO2 16.1 (49% predicted). Slope 19 RER: 1.17 PETCO2 59 - CRT-D upgrade in 06/08/21 - Echo 11/07/21: EF 20-25% RV mild to moderately reduced.  - Echo 07/10/22: EF <20%, RV okay, mod pericardial effusion, mod MR/TR - NYHA IIIb, volume still slightly elevated. Improving w/ diuresis. Will start PO vs IV diuresis post cath. Will wait to see what RHC shows. - GDMT on hold with soft BP following recent CVA - continue digoxin 0.125 mcg daily - Hgb A1c 5.1, plan to add back SGLT2i prior to discharge - Plan for RHC today - Suspect will need to consider VAD/transplant workup in near future. Previously considered for advanced HF therapies but lung function was limiting based on CPX results 2022. F/u PFT were slightly better. Can revisit candidacy for advanced HF therapies as outpatient.   3. 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 Continue mexilitene  - now s/p ICD.  - Keep K > 4.0 Mg > 2.0  4. CAD - LHC 12/07/19 70-% LAD, no intervention - No s/s ischemia, denies CP - Continue statin and aspirin.  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 Jeralyn Ruths at [facility].  - Has completed prednisone. Now on MTX 20 weekly. Continue folic acid. - We discussed EM biopsy but patient is reluctant  - Needs repeat PET, wasn't able to schedule 10/23 as insurance would not cover. Now has medicaid and medicare   6. Pericardial effusion - Mod on echo this admit - will  follow  7. Iron deficiency anemia - T sat 13% - Would benefit from feraheme prior to discharge. Wait until after MRIs completed.  8. Noncompliance -Missed some doses of medications prior to admission after running out. -Forgets meds at times, stopped farxiga d/t cost.  -Reviewed with PharmD. Now has medicare and medicaid. Cost should no longer be an issue  Length of Stay: 4  Charles Bleacher, NP  07/14/2022, 8:11 AM  Advanced Heart Failure Team Pager [phone] (M-F; 7a - 5p)  Please contact [facility] for night-coverage after hours (5p -7a ) and weekends on [email or web address]  Patient seen and examined with the above-signed Advanced Practice Provider and/or Housestaff. I personally reviewed  laboratory data, imaging studies and relevant notes. I independently examined the patient and formulated the important aspects of the plan. I have edited the note to reflect any of my changes or salient points. I have personally discussed the plan with the patient and/or family.  Feels fine. No CP or SOB. BP soft but stable. No neuro deficits.   General:  Well appearing. No resp difficulty HEENT: normal Neck: supple. JVP 6 Carotids 2+ bilat; no bruits. No lymphadenopathy or thryomegaly appreciated. Cor: PMI nondisplaced. Regular rate & rhythm. No rubs, gallops or murmurs. Lungs: clear Abdomen: soft, nontender, nondistended. No hepatosplenomegaly. No bruits or masses. Good bowel sounds. Extremities: no cyanosis, clubbing, rash, edema Neuro: alert & orientedx3, cranial nerves grossly intact. moves all 4 extremities w/o difficulty. Affect pleasant  Plan RHC today. If ok can go home from HF standpoint. Will arrange outpatient HF f/u to continue to follow for advanced therapies.  Start Eliquis after cath.   Home HF meds  Digoxin 0.125  Farxiga 10  Mexilitene Lasix 40 mg daily PRN only   Charles Meres, MD  9:07 AM

## 2022-07-14 NOTE — Progress Notes (Addendum)
  Advanced Heart Failure Rounding Note  PCP-Cardiologist: Brian Munley, MD  HF: Dr. Rital Cavey  Subjective:    -800mL UOP, weight up 1lb.   Scr 1.15>>1.26>>1.1. K 3.6, Mg 2.0   SBPs soft but improving this morning, 90s-110s. No orthostatic symptoms. Feels good this morning aside from not getting any sleep. Standing at the sink brushing his teeth and hair when I walked in. Cath lab staff walked in to get patient for his RHC today.   Objective:    Echo 07/12/22:  EF < 20%, grade III DD, RV okay, moderate pericardial effusion, moderate MR, moderate TR  Weight Range: 79.4 kg Body mass index is 27.42 kg/m.   Vital Signs:   Temp:  [97.9 F (36.6 C)-98.8 F (37.1 C)] 98.3 F (36.8 C) (06/28 0742) Resp:  [15-24] 22 (06/28 0742) BP: (99-117)/(71-78) 117/76 (06/28 0742) SpO2:  [93 %-99 %] 98 % (06/28 0742) Weight:  [79.4 kg] 79.4 kg (06/28 0536) Last BM Date : 07/13/22  Weight change: Filed Weights   07/12/22 0500 07/13/22 0446 07/14/22 0536  Weight: 82.1 kg 79 kg 79.4 kg   Intake/Output:   Intake/Output Summary (Last 24 hours) at 07/14/2022 0811 Last data filed at 07/14/2022 0416 Gross per 24 hour  Intake --  Output 800 ml  Net -800 ml    Physical Exam  General:  well appearing.  No respiratory difficulty HEENT: normal Neck: supple. JVD ~8 cm. Carotids 2+ bilat; no bruits. No lymphadenopathy or thyromegaly appreciated. Cor: PMI nondisplaced. Regular rate & rhythm. No rubs, gallops or murmurs. Lungs: clear Abdomen: soft, nontender, slightly distended. No hepatosplenomegaly. No bruits or masses. Good bowel sounds. Extremities: no cyanosis, clubbing, rash, +1 BLE edema  Neuro: alert & oriented x 3, cranial nerves grossly intact. moves all 4 extremities w/o difficulty. Affect pleasant.   Telemetry   NSR 80s, occasional PVCs (Personally reviewed)    Labs    CBC Recent Labs    07/13/22 0014 07/14/22 0004  WBC 11.1* 10.0  HGB 10.2* 10.6*  HCT 32.5* 32.9*  MCV  104.5* 105.8*  PLT 146* 142*   Basic Metabolic Panel Recent Labs    07/13/22 0014 07/14/22 0004  NA 134* 134*  K 4.1 3.6  CL 92* 94*  CO2 35* 32  GLUCOSE 95 122*  BUN 26* 25*  CREATININE 1.26* 1.10  CALCIUM 8.5* 8.4*  MG 2.0 2.0   Liver Function Tests No results for input(s): "AST", "ALT", "ALKPHOS", "BILITOT", "PROT", "ALBUMIN" in the last 72 hours.  No results for input(s): "LIPASE", "AMYLASE" in the last 72 hours. Cardiac Enzymes No results for input(s): "CKTOTAL", "CKMB", "CKMBINDEX", "TROPONINI" in the last 72 hours.  BNP: BNP (last 3 results) Recent Labs    11/07/21 1457 07/11/22 0209  BNP 284.0* 1,588.2*    ProBNP (last 3 results) No results for input(s): "PROBNP" in the last 8760 hours.   D-Dimer No results for input(s): "DDIMER" in the last 72 hours. Hemoglobin A1C No results for input(s): "HGBA1C" in the last 72 hours.  Fasting Lipid Panel No results for input(s): "CHOL", "HDL", "LDLCALC", "TRIG", "CHOLHDL", "LDLDIRECT" in the last 72 hours.  Thyroid Function Tests No results for input(s): "TSH", "T4TOTAL", "T3FREE", "THYROIDAB" in the last 72 hours.  Invalid input(s): "FREET3"   Other results:   Imaging    No results found.   Medications:     Scheduled Medications:  aspirin EC  325 mg Oral Daily   atorvastatin  80 mg Oral Daily   Chlorhexidine Gluconate   Cloth  6 each Topical Daily   digoxin  0.125 mg Oral Daily   folic acid  1 mg Oral Daily   [START ON 07/15/2022] methotrexate  20 mg Oral Weekly   mexiletine  250 mg Oral Q12H   sodium chloride flush  3 mL Intravenous Q12H    Infusions:  sodium chloride     sodium chloride      PRN Medications: sodium chloride, mouth rinse, polyethylene glycol, senna-docusate, sodium chloride flush    Assessment/Plan  1. Acute Stroke - Admitted with acute L sided weakness, CTA revealed an acute right M1 occlusion. S/p TPA and mechanical clot extraction. No residual deficits  -Likely  cardioembolic in setting of severe LV dysfunction, will eventually need DOAC post cath  -No AF on tele/ device check. Reviewed with [employer] rep.  2. Acute on Chronic Systolic HF - Diagnosed 11/2019. Presented with VT. LHC 70% LAD  - Echo EF 25-30%  - cMRI 12/21 concerning for sarcoid and EF 18%.  - PET 2/22 at [facility] EF 25% + active sarcoid - Echo 08/26/20 EF < 20% severely dilated LV RV mildly decreased.  - CPX 8/22. Unable to complete PFTs due to hypoventilation and poor effort. pVO2 16.1 (49% predicted). Slope 19 RER: 1.17 PETCO2 59 - CRT-D upgrade in 06/08/21 - Echo 11/07/21: EF 20-25% RV mild to moderately reduced.  - Echo 07/10/22: EF <20%, RV okay, mod pericardial effusion, mod MR/TR - NYHA IIIb, volume still slightly elevated. Improving w/ diuresis. Will start PO vs IV diuresis post cath. Will wait to see what RHC shows. - GDMT on hold with soft BP following recent CVA - continue digoxin 0.125 mcg daily - Hgb A1c 5.1, plan to add back SGLT2i prior to discharge - Plan for RHC today - Suspect will need to consider VAD/transplant workup in near future. Previously considered for advanced HF therapies but lung function was limiting based on CPX results 2022. F/u PFT were slightly better. Can revisit candidacy for advanced HF therapies as outpatient.   3. 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 Continue mexilitene  - now s/p ICD.  - Keep K > 4.0 Mg > 2.0  4. CAD - LHC 12/07/19 70-% LAD, no intervention - No s/s ischemia, denies CP - Continue statin and aspirin.  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 Karra at [facility].  - Has completed prednisone. Now on MTX 20 weekly. Continue folic acid. - We discussed EM biopsy but patient is reluctant  - Needs repeat PET, wasn't able to schedule 10/23 as insurance would not cover. Now has medicaid and medicare   6. Pericardial effusion - Mod on echo this admit - will  follow  7. Iron deficiency anemia - T sat 13% - Would benefit from feraheme prior to discharge. Wait until after MRIs completed.  8. Noncompliance -Missed some doses of medications prior to admission after running out. -Forgets meds at times, stopped farxiga d/t cost.  -Reviewed with PharmD. Now has medicare and medicaid. Cost should no longer be an issue  Length of Stay: 4  Alma L Diaz, NP  07/14/2022, 8:11 AM  Advanced Heart Failure Team Pager [phone] (M-F; 7a - 5p)  Please contact [facility] for night-coverage after hours (5p -7a ) and weekends on [email or web address]  Patient seen and examined with the above-signed Advanced Practice Provider and/or Housestaff. I personally reviewed   laboratory data, imaging studies and relevant notes. I independently examined the patient and formulated the important aspects of the plan. I have edited the note to reflect any of my changes or salient points. I have personally discussed the plan with the patient and/or family.  Feels fine. No CP or SOB. BP soft but stable. No neuro deficits.   General:  Well appearing. No resp difficulty HEENT: normal Neck: supple. JVP 6 Carotids 2+ bilat; no bruits. No lymphadenopathy or thryomegaly appreciated. Cor: PMI nondisplaced. Regular rate & rhythm. No rubs, gallops or murmurs. Lungs: clear Abdomen: soft, nontender, nondistended. No hepatosplenomegaly. No bruits or masses. Good bowel sounds. Extremities: no cyanosis, clubbing, rash, edema Neuro: alert & orientedx3, cranial nerves grossly intact. moves all 4 extremities w/o difficulty. Affect pleasant  Plan RHC today. If ok can go home from HF standpoint. Will arrange outpatient HF f/u to continue to follow for advanced therapies.  Start Eliquis after cath.   Home HF meds  Digoxin 0.125  Farxiga 10  Mexilitene Lasix 40 mg daily PRN only   Zilphia Kozinski, MD  9:07 AM   

## 2022-07-14 NOTE — Plan of Care (Signed)
  Problem: Education: Goal: Knowledge of disease or condition will improve Outcome: Progressing   Problem: Ischemic Stroke/TIA Tissue Perfusion: Goal: Complications of ischemic stroke/TIA will be minimized Outcome: Progressing   

## 2022-07-14 NOTE — Discharge Summary (Incomplete)
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   aspirin EC 81 MG tablet Take 1 tablet (81 mg total) by mouth daily.   atorvastatin 80 MG tablet Commonly known as: LIPITOR TAKE 1 TABLET BY MOUTH EVERY DAY   carvedilol 3.125 MG tablet Commonly known as: COREG TAKE 1 TABLET BY MOUTH TWICE A DAY   dapagliflozin propanediol 10 MG Tabs tablet Commonly known as: Farxiga Take 1 tablet (10 mg total) by mouth daily before breakfast.   digoxin 0.125 MG tablet Commonly known as: LANOXIN Take 1 tablet (0.125 mg total) by mouth daily. Start taking on: July 17, 2022   dorzolamide-timolol 2-0.5 % ophthalmic solution Commonly known as: COSOPT Place 1 drop into both eyes in the morning and at bedtime.   Eliquis 5 MG Tabs tablet Generic drug: apixaban Take 1 tablet (5 mg total) by mouth 2 (two) times daily.   folic acid 1 MG tablet Commonly known as: FOLVITE TAKE 1 TABLET BY MOUTH EVERY DAY   furosemide 40 MG tablet Commonly known as: LASIX Take 1 tablet (40 mg total) by mouth daily. Start taking on: July 17, 2022 What changed:  medication strength how much to  take additional instructions   latanoprost 0.005 % ophthalmic solution Commonly known as: XALATAN Place 1 drop into both eyes at bedtime.   losartan 25 MG tablet Commonly known as: COZAAR Take 0.5 tablets (12.5 mg total) by mouth daily. Start taking on: July 17, 2022 What changed: See the new instructions.   methotrexate 2.5 MG tablet Commonly known as: RHEUMATREX Take 8 tablets (20 mg total) by mouth once a week. NEEDS FOLLOW UP APPOINTMENT FOR MORE REFILLS   mexiletine 250 MG capsule Commonly known as: MEXITIL Take 1 capsule (250 mg total) by mouth 2 (two) times daily.   multivitamin with minerals tablet Take 1 tablet by mouth daily.   pantoprazole 40 MG tablet Commonly known as: PROTONIX Take 40 mg by mouth daily before breakfast.   spironolactone 25 MG tablet Commonly known as: ALDACTONE TAKE 1/2 TABLET BY MOUTH DAILY               Durable Medical Equipment  (From admission, onward)           Start     Ordered   07/14/22 1605  For home use only DME Shower stool  Once        07/14/22 1604            LABORATORY STUDIES CBC    Component Value Date/Time   WBC 11.2 (H) 07/16/2022 0020   RBC 3.64 (L) 07/16/2022 0020   HGB 11.9 (L) 07/16/2022  0020   HGB 14.3 05/31/2021 0906   HCT 37.5 (L) 07/16/2022 0020   HCT 42.6 05/31/2021 0906   PLT 205 07/16/2022 0020   PLT 203 05/31/2021 0906   MCV 103.0 (H) 07/16/2022 0020   MCV 98 (H) 05/31/2021 0906   MCH 32.7 07/16/2022 0020   MCHC 31.7 07/16/2022 0020   RDW 14.9 07/16/2022 0020   RDW 15.8 (H) 05/31/2021 0906   LYMPHSABS 0.9 05/31/2021 0906   MONOABS 0.8 02/03/2020 1520   EOSABS 0.1 05/31/2021 0906   BASOSABS 0.0 05/31/2021 0906   CMP    Component Value Date/Time   NA 138 07/16/2022 0020   NA 143 05/31/2021 0906   K 3.6 07/16/2022 0020   CL 93 (L) 07/16/2022 0020   CO2 35 (H) 07/16/2022 0020   GLUCOSE 116 (H) 07/16/2022 0020   BUN 22 (H) 07/16/2022 0020   BUN 12 05/31/2021 0906    CREATININE 1.14 07/16/2022 0020   CALCIUM 8.9 07/16/2022 0020   PROT 6.1 (L) 07/11/2022 0209   ALBUMIN 3.5 07/11/2022 0209   AST 25 07/11/2022 0209   ALT 25 07/11/2022 0209   ALKPHOS 79 07/11/2022 0209   BILITOT 2.4 (H) 07/11/2022 0209   GFRNONAA >60 07/16/2022 0020   COAGS Lab Results  Component Value Date   INR 1.3 (H) 07/11/2022   Lipid Panel    Component Value Date/Time   CHOL 102 07/11/2022 0209   TRIG 50 07/11/2022 0209   HDL 33 (L) 07/11/2022 0209   CHOLHDL 3.1 07/11/2022 0209   VLDL 10 07/11/2022 0209   LDLCALC 59 07/11/2022 0209   HgbA1C  Lab Results  Component Value Date   HGBA1C 5.1 07/11/2022   Urinalysis No results found for: "COLORURINE", "APPEARANCEUR", "LABSPEC", "PHURINE", "GLUCOSEU", "HGBUR", "BILIRUBINUR", "KETONESUR", "PROTEINUR", "UROBILINOGEN", "NITRITE", "LEUKOCYTESUR" Urine Drug Screen     Component Value Date/Time   LABOPIA NONE DETECTED 07/11/2022 1140   COCAINSCRNUR NONE DETECTED 07/11/2022 1140   LABBENZ NONE DETECTED 07/11/2022 1140   AMPHETMU NONE DETECTED 07/11/2022 1140   THCU NONE DETECTED 07/11/2022 1140   LABBARB NONE DETECTED 07/11/2022 1140    Alcohol Level No results found for: "ETH"   SIGNIFICANT DIAGNOSTIC STUDIES CARDIAC CATHETERIZATION  Result Date: 07/14/2022 Findings: RA = 9 RV = 58/9 PA = 58/26 (39) PCW = 30 (v = 40-45) Fick cardiac output/index = 5.0/2.6 PVR = 1.8 Ao sat = 90% PA sat = 55% Assessment: 1. Moderate to severely elevated filling pressures with normal cardiac output Plan/Discussion: Continue diuresis. Can add dobutamine as needed for BP support. Arvilla Meres, MD 10:12 AM  MR BRAIN WO CONTRAST  Result Date: 07/12/2022 CLINICAL DATA:  Stroke, follow-up. EXAM: MRI HEAD WITHOUT CONTRAST TECHNIQUE: Multiplanar, multiecho pulse sequences of the brain and surrounding structures were obtained without intravenous contrast. COMPARISON:  Head CT July 11, 2022. FINDINGS: Limited study due to patient inability to  tolerate lying still in the scanner. Only diffusion-weighted images were obtained. Area of restricted diffusion involving anterior/lateral aspect of the right temporal lobe and posterior insula. Additional areas of restricted diffusion within the right putamen and right caudate body. Findings are consistent with subacute right MCA territory infarct. No significant mass effect. No hydrocephalus, extra-axial collection or mass lesion. IMPRESSION: Limited study showing subacute right MCA territory infarct involving the right temporal lobe, insula and basal ganglia. Electronically Signed   By: Baldemar Lenis M.D.   On: 07/12/2022 14:53   MR ANGIO HEAD WO CONTRAST  Result Date: 07/12/2022 CLINICAL DATA:  Stroke.  Follow-up. EXAM: MRA HEAD WITHOUT CONTRAST TECHNIQUE: Angiographic images of the Circle of Willis were acquired using MRA technique without intravenous contrast. COMPARISON:  Head CT yesterday. CT studies 07/10/2022 FINDINGS: Anterior circulation: Presently, the anterior circulation has normal appearance. There is restoration of flow in the right MCA branch vessels. No large or medium vessel occlusion or flow limiting proximal stenosis is identified. Left anterior circulation is normal. Posterior circulation: Right vertebral artery terminates in PICA. Dominant left vertebral artery supplies the basilar artery. No basilar stenosis. Posterior circulation branch vessels are normal. Fetal origin of the right PCA Anatomic variants: None other significant. Other: None. IMPRESSION: No large or medium vessel occlusion or flow limiting proximal stenosis. Restoration of flow in the right MCA branch vessels. Electronically Signed   By: Paulina Fusi M.D.   On: 07/12/2022 13:24   CT HEAD WO CONTRAST ( )  Result Date: 07/11/2022 CLINICAL DATA:  Stroke follow-up EXAM: CT HEAD WITHOUT CONTRAST TECHNIQUE: Contiguous axial images were obtained from the base of the skull through the vertex without  intravenous contrast. RADIATION DOSE REDUCTION: This exam was performed according to the departmental dose-optimization program which includes automated exposure control, adjustment of the mA and/or kV according to patient size and/or use of iterative reconstruction technique. COMPARISON:  07/10/2022 CT head FINDINGS: Brain: Mild hypodensity to in the right anterior temporal lobe and insula, with some mild narrowing of the right sylvian fissure compared to the prior exam, likely mild edema related to the MCA occlusion noted on the prior exam. No evidence of acute hemorrhage, mass, mass effect, or midline shift. No hydrocephalus or extra-axial fluid collection. No distal area of hypodensity to suggest additional acute infarct. Vascular: No hyperdense vessel. Skull: Negative for fracture or focal lesion. Sinuses/Orbits: Mild mucosal thickening in the right maxillary sinus. No acute finding in the orbits. Other: The mastoid air cells are well aerated. IMPRESSION: Mild hypodensity to in the right anterior temporal lobe and insula, likely mild edema related to the MCA occlusion noted on the prior exam. No evidence of hemorrhagic transformation or significant midline shift. Electronically Signed   By: Wiliam Ke M.D.   On: 07/11/2022 19:20   DG CHEST PORT 1 VIEW  Result Date: 07/11/2022 CLINICAL DATA:  Respiratory abnormality. History of hypertension and CHF. EXAM: PORTABLE CHEST 1 VIEW COMPARISON:  Chest radiograph 1 day prior FINDINGS: The left chest wall cardiac device and associated leads are stable. The heart is enlarged, unchanged. The upper mediastinal contours are stable There is mild pulmonary interstitial edema which appears slightly worsened compared to the study from 1 day prior. There is no focal airspace consolidation. There is no significant pleural effusion. There is no pneumothorax There is no acute osseous abnormality. Remote right rib fractures are again noted. IMPRESSION: Cardiomegaly with mild  pulmonary interstitial edema, slightly worsened compared to the study from 1 day prior. Electronically Signed   By: Lesia Hausen M.D.   On: 07/11/2022 15:20   IR PERCUTANEOUS ART THROMBECTOMY/INFUSION INTRACRANIAL INC DIAG ANGIO  Result Date: 07/11/2022 INDICATION: 57 year old male presented to outside hospital with sudden onset left-sided weakness and facial droop; NIHSS 6. His last known well was 7:30 p.m. on 07/10/2022. His past medical history significant for CAD, systolic heart failure secondary to possible cardiac sarcoidosis, wide complex tachycardia, ICD implant, HLD and HTN; baseline modified Rankin scale 0. Head CT showed no evidence of an acute infarct or hemorrhage (ASPECT 10). He received IV tPA. CT angiogram of the head and neck showed  a right M1/MCA occlusion. He was then transferred to [facility] for mechanical thrombectomy. EXAM: ULTRASOUND-GUIDED VASCULAR ACCESS DIAGNOSTIC CEREBRAL ANGIOGRAM MECHANICAL THROMBECTOMY FLAT PANEL HEAD CT COMPARISON:  CT/CT angiogram of the head and neck July 10, 2022. MEDICATIONS: No antibiotics administered. ANESTHESIA/SEDATION: The procedure was performed under general anesthesia. CONTRAST:  40 mL of Omnipaque 300 milligram/mL. FLUOROSCOPY: Radiation Exposure Index (as provided by the fluoroscopic device): 365 mGy Kerma COMPLICATIONS: None immediate. TECHNIQUE: Informed verbal consent was obtained from the patient's daughter via telephone after a thorough discussion of the procedural risks, benefits and alternatives. All questions were addressed. Maximal Sterile Barrier Technique was utilized including caps, mask, sterile gowns, sterile gloves, sterile drape, hand hygiene and skin antiseptic. A timeout was performed prior to the initiation of the procedure. The right groin was prepped and draped in the usual sterile fashion. Using a micropuncture kit and the modified Seldinger technique, access was gained to the right common femoral artery and an 8 French sheath  was placed. Real-time ultrasound guidance was utilized for vascular access including the acquisition of a permanent ultrasound image documenting patency of the accessed vessel. Under fluoroscopy, a Zoom 88 guide catheter was navigated over a 6 [place] Berenstein 2 catheter and a 0.035" Terumo Glidewire into the aortic arch. The catheter was placed into the right common carotid artery and then advanced into the right internal carotid artery. The diagnostic catheter was removed. Frontal and lateral angiograms of the head were obtained. FINDINGS: 1. Normal caliber of the right common femoral artery, adequate for vascular access. 2. Interval recanalization of the right M1/MCA segment with embolization to the proximal right M3/MCA posterior division branch. 3. Normal flow to the right ACA vascular tree. PROCEDURE: Using biplane roadmap guidance, a Red 62 aspiration catheter was navigated over Colossus 35 microguidewire into the cavernous segment of the right ICA. The aspiration catheter was then advanced to the level of occlusion in the M3 segment and connected to an aspiration pump. Continuous aspiration was performed for 1.5 minutes. The guide catheter was connected to a VacLok syringe. The aspiration catheter was subsequently removed under constant aspiration. The guide catheter was aspirated for debris. Right internal carotid artery angiograms with frontal and lateral views of the head review complete recanalization of the right MCA vascular tree (TICI 3). Flat panel CT of the head was obtained and post processed in a separate workstation with concurrent attending physician supervision. Selected images were sent to PACS. No evidence of hemorrhagic complication. The catheter was subsequently withdrawn. Right common femoral artery angiogram was obtained in right anterior oblique view. The puncture is at the level of the common femoral artery. The artery has normal caliber, adequate for closure device. The sheath was  exchanged over the wire for a Perclose prostyle which was utilized for access closure. Immediate hemostasis was achieved. IMPRESSION: 1. Successful mechanical thrombectomy for treatment of a right M3/MCA occlusion achieving complete recanalization after 1 direct contact aspiration pass (TICI 3). 2. No thromboembolic or hemorrhagic complication. PLAN: Transfer to ICU for observation. Electronically Signed   By: Baldemar Lenis M.D.   On: 07/11/2022 10:40   IR CT Head Ltd  Result Date: 07/11/2022 INDICATION: 57 year old male presented to outside hospital with sudden onset left-sided weakness and facial droop; NIHSS 6. His last known well was 7:30 p.m. on 07/10/2022. His past medical history significant for CAD, systolic heart failure secondary to possible cardiac sarcoidosis, wide complex tachycardia, ICD implant, HLD and HTN; baseline modified Rankin scale 0. Head CT  showed no evidence of an acute infarct or hemorrhage (ASPECT 10). He received IV tPA. CT angiogram of the head and neck showed a right M1/MCA occlusion. He was then transferred to [facility] for mechanical thrombectomy. EXAM: ULTRASOUND-GUIDED VASCULAR ACCESS DIAGNOSTIC CEREBRAL ANGIOGRAM MECHANICAL THROMBECTOMY FLAT PANEL HEAD CT COMPARISON:  CT/CT angiogram of the head and neck July 10, 2022. MEDICATIONS: No antibiotics administered. ANESTHESIA/SEDATION: The procedure was performed under general anesthesia. CONTRAST:  40 mL of Omnipaque 300 milligram/mL. FLUOROSCOPY: Radiation Exposure Index (as provided by the fluoroscopic device): 365 mGy Kerma COMPLICATIONS: None immediate. TECHNIQUE: Informed verbal consent was obtained from the patient's daughter via telephone after a thorough discussion of the procedural risks, benefits and alternatives. All questions were addressed. Maximal Sterile Barrier Technique was utilized including caps, mask, sterile gowns, sterile gloves, sterile drape, hand hygiene and skin antiseptic. A timeout was  performed prior to the initiation of the procedure. The right groin was prepped and draped in the usual sterile fashion. Using a micropuncture kit and the modified Seldinger technique, access was gained to the right common femoral artery and an 8 French sheath was placed. Real-time ultrasound guidance was utilized for vascular access including the acquisition of a permanent ultrasound image documenting patency of the accessed vessel. Under fluoroscopy, a Zoom 88 guide catheter was navigated over a 6 [place] Berenstein 2 catheter and a 0.035" Terumo Glidewire into the aortic arch. The catheter was placed into the right common carotid artery and then advanced into the right internal carotid artery. The diagnostic catheter was removed. Frontal and lateral angiograms of the head were obtained. FINDINGS: 1. Normal caliber of the right common femoral artery, adequate for vascular access. 2. Interval recanalization of the right M1/MCA segment with embolization to the proximal right M3/MCA posterior division branch. 3. Normal flow to the right ACA vascular tree. PROCEDURE: Using biplane roadmap guidance, a Red 62 aspiration catheter was navigated over Colossus 35 microguidewire into the cavernous segment of the right ICA. The aspiration catheter was then advanced to the level of occlusion in the M3 segment and connected to an aspiration pump. Continuous aspiration was performed for 1.5 minutes. The guide catheter was connected to a VacLok syringe. The aspiration catheter was subsequently removed under constant aspiration. The guide catheter was aspirated for debris. Right internal carotid artery angiograms with frontal and lateral views of the head review complete recanalization of the right MCA vascular tree (TICI 3). Flat panel CT of the head was obtained and post processed in a separate workstation with concurrent attending physician supervision. Selected images were sent to PACS. No evidence of hemorrhagic complication.  The catheter was subsequently withdrawn. Right common femoral artery angiogram was obtained in right anterior oblique view. The puncture is at the level of the common femoral artery. The artery has normal caliber, adequate for closure device. The sheath was exchanged over the wire for a Perclose prostyle which was utilized for access closure. Immediate hemostasis was achieved. IMPRESSION: 1. Successful mechanical thrombectomy for treatment of a right M3/MCA occlusion achieving complete recanalization after 1 direct contact aspiration pass (TICI 3). 2. No thromboembolic or hemorrhagic complication. PLAN: Transfer to ICU for observation. Electronically Signed   By: Baldemar Lenis M.D.   On: 07/11/2022 10:40   IR US Guide Vasc Access Right  Result Date: 07/11/2022 INDICATION: 57 year old male presented to outside hospital with sudden onset left-sided weakness and facial droop; NIHSS 6. His last known well was 7:30 p.m. on 07/10/2022. His past medical history significant  for CAD, systolic heart failure secondary to possible cardiac sarcoidosis, wide complex tachycardia, ICD implant, HLD and HTN; baseline modified Rankin scale 0. Head CT showed no evidence of an acute infarct or hemorrhage (ASPECT 10). He received IV tPA. CT angiogram of the head and neck showed a right M1/MCA occlusion. He was then transferred to [facility] for mechanical thrombectomy. EXAM: ULTRASOUND-GUIDED VASCULAR ACCESS DIAGNOSTIC CEREBRAL ANGIOGRAM MECHANICAL THROMBECTOMY FLAT PANEL HEAD CT COMPARISON:  CT/CT angiogram of the head and neck July 10, 2022. MEDICATIONS: No antibiotics administered. ANESTHESIA/SEDATION: The procedure was performed under general anesthesia. CONTRAST:  40 mL of Omnipaque 300 milligram/mL. FLUOROSCOPY: Radiation Exposure Index (as provided by the fluoroscopic device): 365 mGy Kerma COMPLICATIONS: None immediate. TECHNIQUE: Informed verbal consent was obtained from the patient's daughter via telephone  after a thorough discussion of the procedural risks, benefits and alternatives. All questions were addressed. Maximal Sterile Barrier Technique was utilized including caps, mask, sterile gowns, sterile gloves, sterile drape, hand hygiene and skin antiseptic. A timeout was performed prior to the initiation of the procedure. The right groin was prepped and draped in the usual sterile fashion. Using a micropuncture kit and the modified Seldinger technique, access was gained to the right common femoral artery and an 8 French sheath was placed. Real-time ultrasound guidance was utilized for vascular access including the acquisition of a permanent ultrasound image documenting patency of the accessed vessel. Under fluoroscopy, a Zoom 88 guide catheter was navigated over a 6 [place] Berenstein 2 catheter and a 0.035" Terumo Glidewire into the aortic arch. The catheter was placed into the right common carotid artery and then advanced into the right internal carotid artery. The diagnostic catheter was removed. Frontal and lateral angiograms of the head were obtained. FINDINGS: 1. Normal caliber of the right common femoral artery, adequate for vascular access. 2. Interval recanalization of the right M1/MCA segment with embolization to the proximal right M3/MCA posterior division branch. 3. Normal flow to the right ACA vascular tree. PROCEDURE: Using biplane roadmap guidance, a Red 62 aspiration catheter was navigated over Colossus 35 microguidewire into the cavernous segment of the right ICA. The aspiration catheter was then advanced to the level of occlusion in the M3 segment and connected to an aspiration pump. Continuous aspiration was performed for 1.5 minutes. The guide catheter was connected to a VacLok syringe. The aspiration catheter was subsequently removed under constant aspiration. The guide catheter was aspirated for debris. Right internal carotid artery angiograms with frontal and lateral views of the head review  complete recanalization of the right MCA vascular tree (TICI 3). Flat panel CT of the head was obtained and post processed in a separate workstation with concurrent attending physician supervision. Selected images were sent to PACS. No evidence of hemorrhagic complication. The catheter was subsequently withdrawn. Right common femoral artery angiogram was obtained in right anterior oblique view. The puncture is at the level of the common femoral artery. The artery has normal caliber, adequate for closure device. The sheath was exchanged over the wire for a Perclose prostyle which was utilized for access closure. Immediate hemostasis was achieved. IMPRESSION: 1. Successful mechanical thrombectomy for treatment of a right M3/MCA occlusion achieving complete recanalization after 1 direct contact aspiration pass (TICI 3). 2. No thromboembolic or hemorrhagic complication. PLAN: Transfer to ICU for observation. Electronically Signed   By: Baldemar Lenis M.D.   On: 07/11/2022 10:40   ECHOCARDIOGRAM COMPLETE  Result Date: 07/11/2022    ECHOCARDIOGRAM REPORT   Patient Name:  Holli Humbles Date of Exam: 07/11/2022 Medical Rec #:  [id number]       Height:       67.0 in Accession #:    [id number]      Weight:       183.8 lb Date of Birth:  1965/11/20       BSA:          1.951 m Patient Age:    56 years        BP:           89/67 mmHg Patient Gender: M               HR:           86 bpm. Exam Location:  Inpatient Procedure: 2D Echo, Color Doppler and Cardiac Doppler Indications:    Stroke  History:        Patient has prior history of Echocardiogram examinations, most                 recent 11/07/2021. Sarcoidosis and CHF, CAD; Risk                 Factors:Hypertension.  Sonographer:    Darlys Gales Referring Phys: [phone] ERIC LINDZEN IMPRESSIONS  1. Left ventricular ejection fraction, by estimation, is <20%. The left ventricle has severely decreased function. The left ventricle demonstrates regional wall motion  abnormalities (see scoring diagram/findings for description). The left ventricular internal cavity size was severely dilated. Left ventricular diastolic parameters are consistent with Grade II diastolic dysfunction (pseudonormalization). Elevated left ventricular end-diastolic pressure.  2. Right ventricular systolic function is normal. The right ventricular size is normal. There is moderately elevated pulmonary artery systolic pressure.  3. Left atrial size was mildly dilated.  4. Device lead noted.  5. Moderate pericardial effusion. The pericardial effusion is localized near the right atrium and anterior to the right ventricle. There is no evidence of cardiac tamponade.  6. The mitral valve is normal in structure. Moderate mitral valve regurgitation. No evidence of mitral stenosis.  7. Tricuspid valve regurgitation is moderate.  8. The aortic valve is normal in structure. Aortic valve regurgitation is not visualized. No aortic stenosis is present.  9. The inferior vena cava is normal in size with greater than 50% respiratory variability, suggesting right atrial pressure of 3 mmHg. FINDINGS  Left Ventricle: Left ventricular ejection fraction, by estimation, is <20%. The left ventricle has severely decreased function. The left ventricle demonstrates regional wall motion abnormalities. The left ventricular internal cavity size was severely dilated. There is no left ventricular hypertrophy. Left ventricular diastolic parameters are consistent with Grade II diastolic dysfunction (pseudonormalization). Elevated left ventricular end-diastolic pressure.  LV Wall Scoring: The inferior septum is akinetic. The entire anterior wall, entire lateral wall, anterior septum, entire inferior wall, and apex are hypokinetic. Right Ventricle: The right ventricular size is normal. No increase in right ventricular wall thickness. Right ventricular systolic function is normal. There is moderately elevated pulmonary artery systolic  pressure. The tricuspid regurgitant velocity is 3.74 m/s, and with an assumed right atrial pressure of 3 mmHg, the estimated right ventricular systolic pressure is 59.0 mmHg. Left Atrium: Left atrial size was mildly dilated. Right Atrium: Device lead noted. Right atrial size was normal in size. Pericardium: A moderately sized pericardial effusion is present. The pericardial effusion is localized near the right atrium and anterior to the right ventricle. There is no evidence of cardiac tamponade. Mitral Valve: The mitral valve is normal in structure.  Moderate mitral valve regurgitation. No evidence of mitral valve stenosis. Tricuspid Valve: The tricuspid valve is normal in structure. Tricuspid valve regurgitation is moderate . No evidence of tricuspid stenosis. Aortic Valve: The aortic valve is normal in structure. Aortic valve regurgitation is not visualized. No aortic stenosis is present. Aortic valve mean gradient measures 4.0 mmHg. Aortic valve peak gradient measures 6.5 mmHg. Aortic valve area, by VTI measures 2.40 cm. Pulmonic Valve: The pulmonic valve was not well visualized. Pulmonic valve regurgitation is not visualized. No evidence of pulmonic stenosis. Aorta: The aortic root is normal in size and structure. Venous: The inferior vena cava is normal in size with greater than 50% respiratory variability, suggesting right atrial pressure of 3 mmHg. IAS/Shunts: No atrial level shunt detected by color flow Doppler. Additional Comments: A device lead is visualized.  LEFT VENTRICLE PLAX 2D LVIDd:         7.60 cm   Diastology LVIDs:         7.00 cm   LV e' medial:    3.81 cm/s LV PW:         1.35 cm   LV E/e' medial:  27.8 LV IVS:        0.65 cm   LV e' lateral:   5.55 cm/s LVOT diam:     2.00 cm   LV E/e' lateral: 19.1 LV SV:         59 LV SV Index:   30 LVOT Area:     3.14 cm  RIGHT VENTRICLE            IVC RV S prime:     9.90 cm/s  IVC diam: 2.50 cm TAPSE (M-mode): 2.0 cm LEFT ATRIUM             Index LA  diam:        4.60 cm 2.36 cm/m LA Vol (A2C):   27.9 ml 14.30 ml/m LA Vol (A4C):   85.1 ml 43.62 ml/m LA Biplane Vol: 52.9 ml 27.12 ml/m  AORTIC VALVE AV Area (Vmax):    2.82 cm AV Area (Vmean):   2.51 cm AV Area (VTI):     2.40 cm AV Vmax:           127.00 cm/s AV Vmean:          93.000 cm/s AV VTI:            0.245 m AV Peak Grad:      6.5 mmHg AV Mean Grad:      4.0 mmHg LVOT Vmax:         114.00 cm/s LVOT Vmean:        74.300 cm/s LVOT VTI:          0.187 m LVOT/AV VTI ratio: 0.76  AORTA Ao Root diam: 2.90 cm MITRAL VALVE                  TRICUSPID VALVE MV Area (PHT): 4.21 cm       TR Peak grad:   56.0 mmHg MV Decel Time: 180 msec       TR Vmax:        374.00 cm/s MR Peak grad:    52.4 mmHg MR Mean grad:    36.0 mmHg    SHUNTS MR Vmax:         362.00 cm/s  Systemic VTI:  0.19 m MR Vmean:        280.0 cm/s   Systemic Diam: 2.00 cm MR PISA:  12.32 cm MR PISA Eff ROA: 117 mm MR PISA Radius:  1.40 cm MV E velocity: 106.00 cm/s MV A velocity: 48.20 cm/s MV E/A ratio:  2.20 Kardie Tobb DO Electronically signed by Thomasene Ripple DO Signature Date/Time: 07/11/2022/10:39:52 AM    Final       HISTORY OF PRESENT ILLNESS Mr. PALADIN VEILLETTE is a 57 y.o. 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 [name] was 7:30 PM. BP prior to arrival was in the low range at 104/56 per EMS. CT head was negative for acute abnormality with ASPECTS of 10. Teleneurology was consulted and he was deemed to be a tPA candidate, with tPA infusion started at [facility]. CTA revealed an acute right M1 occlusion and he was emergently transferred to [facility] for a mechanical thrombectomy.    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 MRA head right MCA patent 2D Echo <20% ICD interrogation no A-fib LDL 59 HgbA1c 5.1 VTE prophylaxis - SCDs aspirin 81 mg daily prior to admission, now on aspirin 325 mg daily. Will consider DOAC after RHC tomorrow Therapy recommendations: Outpatient OT, no PT or SLP Disposition:  pending    Chronic Systolic Heart Failure Follows with HF team as outpt Current echo showed EF < 20% HF team consulted Followup in CHF clinic. Cardiac meds for home: Lasix 40 mg daily, KCl 20 daily, spironolactone 25 daily, losartan 12.5 daily, Farxiga 10 daily, Eliquis 5 bid, mexiletine 250 bid, digoxin 0.125, atorvastatin 80, methotrexate as prior to admission.   Hx of hypertension Hypotension One time dose of lasix per cardiology  BP 90-100s Close monitoring   Cardiac Sarcoidosis Hx of VT Continue mexilitene ICD interrogation no afib   CAD Home meds: Coreg 3.125, Lasix 40mg , Lostartan 25mg , Aldactone 12.5, Farxiga 10mg , mexilitene Hypotensive Cardiology consulted, appreciate help Follows with Dr. Gala Romney who saw him inpatient as well - recommends oral anticoagulation  - Cardiac MRI - Digoxin 0.15mcg - Hold SGLT2i until closer to discharge   Hyperlipidemia Home meds:  Atorvastatin 80mg  LDL 59, goal < 70 On lipitor 80 Continue statin at discharge   Diabetes type II Controlled Home meds:  Farxiga HgbA1c 5.1, goal < 7.0  Recommend close follow-up with PCP for better DM control   Other Stroke Risk Factors Obstructive sleep apnea CO2 retention, used bipap overnight inpatient Need outpt sleep study done   DISCHARGE EXAM Blood pressure 107/73, pulse  87, temperature 98.3 F (36.8 C), temperature source Oral, resp. rate 18, weight 72.6 kg, SpO2 97 %. NEURO:  Mental Status: AA&Ox3,  Speech/Language: speech is without dysarthria or aphasia.  Naming, repetition, fluency, and comprehension intact.   Cranial Nerves:  II: PERRL. Visual fields full.  III, IV, VI: EOMI. Eyelids elevate symmetrically.  V: Sensation is intact to light touch and symmetrical to face.  VII: Subtle left facial droop VIII: hearing intact to voice. IX, X:Phonation is normal.  XII: tongue is midline without fasciculations. Motor: 5/5 strength to all muscle groups tested.  Slight diminished fine finger movements on the left.  Orbits right over left upper extremity. Tone: is normal and bulk is normal Sensation- Intact to light touch bilaterally Coordination: FTN intact bilaterally- mild asterixis bilaterally.No drift.  Gait- deferred    Discharge Diet       Diet   Diet Heart Room service appropriate? Yes; Fluid consistency: Thin   liquids  DISCHARGE PLAN Disposition:  Home Eliquis (apixaban) daily for secondary stroke prevention  Ongoing stroke risk factor control by Primary Care Physician at time of discharge Follow-up PCP [name], PA-C in 2 weeks. Follow-up in [facility] in 8 weeks, office to schedule an appointment.  Follow up with HF team per their instructions  33 minutes were spent preparing discharge.  Patient seen and examined by NP/APP with MD. MD to update note as needed.   Elmer Picker, DNP, FNP-BC [facility] Pager: [phone]  I have personally obtained history,examined this patient, reviewed notes, independently viewed imaging studies, participated in medical decision making and plan of care.ROS completed by me personally and pertinent positives fully documented  I have made any additions or clarifications directly to the above note. Agree with note above.    Delia Heady, MD Medical  Director [facility] Pager: [phone] 07/16/2022 1:57 PM

## 2022-07-14 NOTE — Interval H&P Note (Signed)
History and Physical Interval Note:  07/14/2022 9:08 AM  Charles Holmes  has presented today for surgery, with the diagnosis of heart failure.  The various methods of treatment have been discussed with the patient and family. After consideration of risks, benefits and other options for treatment, the patient has consented to  Procedure(s): RIGHT HEART CATH (N/A) as a surgical intervention.  The patient's history has been reviewed, patient examined, no change in status, stable for surgery.  I have reviewed the patient's chart and labs.  Questions were answered to the patient's satisfaction.     Gurley Climer

## 2022-07-14 NOTE — TOC Progression Note (Signed)
Transition of Care [facility]) - Progression Note    Patient Details  Name: Charles Holmes MRN: [id number] Date of Birth: 04/23/1965  [facility] CM/SW Contact  Elliot Cousin, RN Phone Number: [phone] 07/14/2022, 4:07 PM  Clinical Narrative:  HF TOC CM spoke to pt's dtr, [name]. Pt states he does not want shower stool at this time. He has a scale at home for daily weights. Discussed with dtr heart healthy and low sodium diet at home. No DME requested. Contacted PCP office, they will call to arrange follow up appt. Requested dc summary be faxed to their office at dc.      Expected Discharge Plan: Home/Self Care Barriers to Discharge: No Barriers Identified  Expected Discharge Plan and Services   Discharge Planning Services: CM Consult   Living arrangements for the past 2 months: Mobile Home                                       Social Determinants of Health (SDOH) Interventions SDOH Screenings   Food Insecurity: No Food Insecurity (07/12/2022)  Housing: Low Risk  (07/12/2022)  Transportation Needs: No Transportation Needs (07/12/2022)  Utilities: Not At Risk (07/12/2022)  Financial Resource Strain: Low Risk  (07/12/2022)  Tobacco Use: Medium Risk (07/11/2022)    Readmission Risk Interventions     No data to display

## 2022-07-14 NOTE — Progress Notes (Addendum)
STROKE TEAM PROGRESS NOTE   BRIEF HPI [name] is a 57 y.o. 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 [name] was 7:30 PM. BP prior to arrival was in the low range at 104/56 per EMS. CT head was negative for acute abnormality with ASPECTS of 10. Teleneurology was consulted and he was deemed to be a tPA candidate, with tPA infusion started at [facility]. CTA revealed an acute right M1 occlusion and he was emergently transferred to [facility] for a mechanical thrombectomy.   SIGNIFICANT HOSPITAL EVENTS 6/24 - Mechanical thrombectomy  6/25- CT at 2000 6/26- MRI showing subacute MCA territory infarct 6/28- RHC- lasix started, Eliquis started  INTERM HISTORY/SUBJECTIVE  OBJECTIVE  CBC    Component Value Date/Time   WBC 10.0 07/14/2022 0004   RBC 3.11 (L) 07/14/2022 0004   HGB 10.6 (L) 07/14/2022 0004   HGB 14.3 05/31/2021 0906   HCT 32.9 (L) 07/14/2022 0004   HCT 42.6 05/31/2021 0906   PLT 142 (L) 07/14/2022 0004   PLT 203 05/31/2021 0906   MCV 105.8 (H) 07/14/2022 0004   MCV 98 (H) 05/31/2021 0906   MCH 34.1 (H) 07/14/2022 0004   MCHC 32.2 07/14/2022 0004   RDW 14.6 07/14/2022 0004   RDW 15.8 (H) 05/31/2021 0906   LYMPHSABS 0.9 05/31/2021 0906   MONOABS 0.8 02/03/2020 1520   EOSABS 0.1 05/31/2021 0906   BASOSABS 0.0 05/31/2021 0906    BMET    Component Value Date/Time   NA 134 (L) 07/14/2022 0004   NA 143 05/31/2021 0906   K 3.6 07/14/2022 0004   CL 94 (L) 07/14/2022 0004   CO2 32 07/14/2022 0004   GLUCOSE 122 (H) 07/14/2022 0004   BUN 25 (H) 07/14/2022 0004   BUN 12 05/31/2021 0906   CREATININE 1.10 07/14/2022 0004   CALCIUM 8.4 (L) 07/14/2022 0004   EGFR 80 05/31/2021 0906   GFRNONAA >60 07/14/2022 0004    IMAGING past 24 hours CARDIAC CATHETERIZATION  Result Date: 07/14/2022 Findings: RA = 9 RV  = 58/9 PA = 58/26 (39) PCW = 30 (v = 40-45) Fick cardiac output/index = 5.0/2.6 PVR = 1.8 Ao sat = 90% PA sat = 55% Assessment: 1. Moderate to severely elevated filling pressures with normal cardiac output Plan/Discussion: Continue diuresis. Can add dobutamine as needed for BP support. Arvilla Meres, MD 10:12 AM   Vitals:   07/14/22 0930 07/14/22 0950 07/14/22 1055 07/14/22 1100  BP: 108/77 99/63 (!) 117/91 (!) 117/91  Pulse: 81 81 91 91  Resp: 17 17 17 16   Temp:    98 F (36.7 C)  TempSrc:    Oral  SpO2: 90% 91% 93% 95%  Weight:         PHYSICAL EXAM General:  Alert, well-nourished, well-developed patient in no acute distress Psych:  Mood and affect appropriate for situation CV: Regular rate and rhythm on monitor Respiratory:  Regular, unlabored respirations on room air GI: Abdomen soft and nontender   NEURO:  Mental Status: AA&Ox3, patient is able to give clear and coherent history Speech/Language: speech is without dysarthria or aphasia.  Naming, repetition, fluency, and comprehension intact.  Cranial Nerves:  II: PERRL. Visual fields full.  III, IV, VI: EOMI. Eyelids elevate symmetrically.  V: Sensation is intact to light touch and symmetrical to face.  VII: Subtle left facial  droop VIII: hearing intact to voice. IX, X:Phonation is normal.  XII: tongue is midline without fasciculations. Motor: 5/5 strength to all muscle groups tested.  Tone: is normal and bulk is normal Sensation- Intact to light touch bilaterally Coordination: FTN intact bilaterally- mild asterixis bilaterally.No drift.  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 MRA head right MCA patent 2D Echo <20% ICD interrogation no A-fib LDL 59 HgbA1c 5.1 VTE prophylaxis - SCDs aspirin 81 mg daily prior to admission, now on Eliquis  Therapy recommendations: Outpatient OT, no PT or SLP Disposition:  pending   Chronic Systolic Heart Failure Follows with HF team as outpt Current echo showed EF < 20% HF team consulted Put on digoxin GDMT per cardiology Digoxin 0.125  Farxiga 10  Mexilitene Lasix 40 mg - originally PRN RHC on 6/28- he is fluid overloaded - lasix started On eliquis  Hx of hypertension Hypotension GDMT per caridology BP 90-100s Close monitoring  Cardiac Sarcoidosis Hx of VT Continue mexilitene ICD interrogation no afib  CAD Home meds: Coreg 3.125, Lasix 40mg , Lostartan 25mg , Aldactone 12.5, Farxiga 10mg , mexilitene Hypotensive Cardiology consulted, appreciate help Follows with Dr. Gala Romney who saw him inpatient as well - recommends oral anticoagulation  - Cardiac MRI - Digoxin 0.130mcg - Hold SGLT2i until closer to discharge  Hyperlipidemia Home meds:  Atorvastatin 80mg  LDL 59, goal < 70 On lipitor 80 Continue statin at discharge  Diabetes type II Controlled Home meds:  Farxiga HgbA1c 5.1, goal < 7.0 CBGs SSI Recommend close follow-up with PCP for better DM control  Other Stroke Risk Factors Obstructive sleep apnea CO2 retention, used bipap overnight inpatient Need outpt sleep study done   Hospital day # 4   Patient seen and examined by NP/APP with MD. MD to update note as needed.   Elmer Picker, DNP, FNP-BC [facility] Pager: [phone]  ATTENDING NOTE: I reviewed above note and agree with the assessment and plan. Pt was seen and examined. Neuro stable and intact. RHC today showed fluid overload, lasix started. Transition ASA to  eliquis today. Continue statin. Appreciate cardiology for CHF management.   For detailed assessment and plan, please refer to above/below as I have made changes wherever appropriate.   Marvel Plan, MD PhD Stroke Neurology 07/14/2022 4:45 PM

## 2022-07-15 ENCOUNTER — Other Ambulatory Visit (HOSPITAL_COMMUNITY): Payer: Self-pay

## 2022-07-15 DIAGNOSIS — Z9889 Other specified postprocedural states: Secondary | ICD-10-CM | POA: Diagnosis not present

## 2022-07-15 DIAGNOSIS — I5043 Acute on chronic combined systolic (congestive) and diastolic (congestive) heart failure: Secondary | ICD-10-CM

## 2022-07-15 LAB — MAGNESIUM: Magnesium: 1.8 mg/dL (ref 1.7–2.4)

## 2022-07-15 LAB — CBC
HCT: 34.9 % — ABNORMAL LOW (ref 39.0–52.0)
Hemoglobin: 11.2 g/dL — ABNORMAL LOW (ref 13.0–17.0)
MCH: 33.9 pg (ref 26.0–34.0)
MCHC: 32.1 g/dL (ref 30.0–36.0)
MCV: 105.8 fL — ABNORMAL HIGH (ref 80.0–100.0)
Platelets: 160 10*3/uL (ref 150–400)
RBC: 3.3 MIL/uL — ABNORMAL LOW (ref 4.22–5.81)
RDW: 14.7 % (ref 11.5–15.5)
WBC: 10.6 10*3/uL — ABNORMAL HIGH (ref 4.0–10.5)
nRBC: 0 % (ref 0.0–0.2)

## 2022-07-15 LAB — BASIC METABOLIC PANEL
Anion gap: 11 (ref 5–15)
BUN: 23 mg/dL — ABNORMAL HIGH (ref 6–20)
CO2: 33 mmol/L — ABNORMAL HIGH (ref 22–32)
Calcium: 8.7 mg/dL — ABNORMAL LOW (ref 8.9–10.3)
Chloride: 93 mmol/L — ABNORMAL LOW (ref 98–111)
Creatinine, Ser: 1.07 mg/dL (ref 0.61–1.24)
GFR, Estimated: >60 mL/min (ref >60)
Glucose, Bld: 107 mg/dL — ABNORMAL HIGH (ref 70–99)
Potassium: 4.4 mmol/L (ref 3.5–5.1)
Sodium: 137 mmol/L (ref 135–145)

## 2022-07-15 MED ORDER — MAGNESIUM SULFATE 2 GM/50ML IV SOLN
2.0000 g | Freq: Once | INTRAVENOUS | Status: AC
  Administered 2022-07-15: 2 g via INTRAVENOUS
  Filled 2022-07-15: qty 50

## 2022-07-15 MED ORDER — SPIRONOLACTONE 12.5 MG HALF TABLET
12.5000 mg | ORAL_TABLET | Freq: Every day | ORAL | Status: DC
  Administered 2022-07-15: 12.5 mg via ORAL
  Filled 2022-07-15 (×2): qty 1

## 2022-07-15 MED ORDER — APIXABAN 5 MG PO TABS
5.0000 mg | ORAL_TABLET | Freq: Two times a day (BID) | ORAL | 3 refills | Status: DC
  Filled 2022-07-15: qty 60, 30d supply, fill #0

## 2022-07-15 MED ORDER — DAPAGLIFLOZIN PROPANEDIOL 10 MG PO TABS
10.0000 mg | ORAL_TABLET | Freq: Every day | ORAL | Status: DC
  Administered 2022-07-15 – 2022-07-16 (×2): 10 mg via ORAL
  Filled 2022-07-15 (×2): qty 1

## 2022-07-15 NOTE — Plan of Care (Signed)
  Problem: Education: Goal: Understanding of CV disease, CV risk reduction, and recovery process will improve Outcome: Progressing Goal: Individualized Educational Video(s) Outcome: Progressing   Problem: Activity: Goal: Ability to return to baseline activity level will improve Outcome: Progressing   Problem: Cardiovascular: Goal: Ability to achieve and maintain adequate cardiovascular perfusion will improve Outcome: Progressing Goal: Vascular access site(s) Level 0-1 will be maintained Outcome: Progressing   Problem: Ischemic Stroke/TIA Tissue Perfusion: Goal: Complications of ischemic stroke/TIA will be minimized Outcome: Progressing

## 2022-07-15 NOTE — Progress Notes (Addendum)
STROKE TEAM PROGRESS NOTE   BRIEF HPI Charles Holmes is a 57 y.o. 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 [name] was 7:30 PM. BP prior to arrival was in the low range at 104/56 per EMS. CT head was negative for acute abnormality with ASPECTS of 10. Teleneurology was consulted and he was deemed to be a tPA candidate, with tPA infusion started at [facility]. CTA revealed an acute right M1 occlusion and he was emergently transferred to [facility] for a mechanical thrombectomy.   SIGNIFICANT HOSPITAL EVENTS 6/24 - Mechanical thrombectomy  6/25- CT at 2000 6/26- MRI showing subacute MCA territory infarct 6/28- RHC- lasix started, Eliquis started  INTERM HISTORY/SUBJECTIVE Patient is sitting up in bed.  He looks comfortable.  He has no complaints today.  Vital signs stable.  Neurological exam unchanged.  CBC, BMP and magnesium are normal.  He diuresed well with Lasix last night OBJECTIVE  CBC    Component Value Date/Time   WBC 10.6 (H) 07/15/2022 0015   RBC 3.30 (L) 07/15/2022 0015   HGB 11.2 (L) 07/15/2022 0015   HGB 14.3 05/31/2021 0906   HCT 34.9 (L) 07/15/2022 0015   HCT 42.6 05/31/2021 0906   PLT 160 07/15/2022 0015   PLT 203 05/31/2021 0906   MCV 105.8 (H) 07/15/2022 0015   MCV 98 (H) 05/31/2021 0906   MCH 33.9 07/15/2022 0015   MCHC 32.1 07/15/2022 0015   RDW 14.7 07/15/2022 0015   RDW 15.8 (H) 05/31/2021 0906   LYMPHSABS 0.9 05/31/2021 0906   MONOABS 0.8 02/03/2020 1520   EOSABS 0.1 05/31/2021 0906   BASOSABS 0.0 05/31/2021 0906    BMET    Component Value Date/Time   NA 137 07/15/2022 0015   NA 143 05/31/2021 0906   K 4.4 07/15/2022 0015   CL 93 (L) 07/15/2022 0015   CO2 33 (H) 07/15/2022 0015   GLUCOSE 107 (H) 07/15/2022 0015   BUN 23 (H) 07/15/2022 0015   BUN 12 05/31/2021 0906   CREATININE 1.07  07/15/2022 0015   CALCIUM 8.7 (L) 07/15/2022 0015   EGFR 80 05/31/2021 0906   GFRNONAA >60 07/15/2022 0015    IMAGING past 24 hours No results found.  Vitals:   07/14/22 2315 07/15/22 0400 07/15/22 0741 07/15/22 1105  BP: 105/81 101/79 97/75 (!) 128/90  Pulse:  90 (!) 108   Resp: 20 18 20  (!) 23  Temp: 98.9 F (37.2 C) 98.6 F (37 C) 98 F (36.7 C) 98.1 F (36.7 C)  TempSrc: Oral Oral Oral Oral  SpO2: 96% 95%    Weight:         PHYSICAL EXAM General:  Alert, well-nourished, well-developed patient in no acute distress Psych:  Mood and affect appropriate for situation CV: Regular rate and rhythm on monitor Respiratory:  Regular, unlabored respirations on room air GI: Abdomen soft and nontender   NEURO:  Mental Status: AA&Ox3,  Speech/Language: speech is without dysarthria or aphasia.  Naming, repetition, fluency, and comprehension intact.  Cranial Nerves:  II: PERRL. Visual fields full.  III, IV, VI: EOMI. Eyelids elevate symmetrically.  V: Sensation is intact to light touch and symmetrical to face.  VII: Subtle left facial droop VIII: hearing intact to voice. IX, X:Phonation is normal.  XII: tongue is midline without fasciculations. Motor: 5/5 strength to all muscle groups tested.  Slight diminished fine finger movements on the left.  Orbits right over left upper extremity. Tone: is normal and bulk is normal Sensation- Intact to light touch bilaterally Coordination: FTN intact bilaterally- mild asterixis bilaterally.No drift.  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 MRA head right MCA patent 2D Echo <20% ICD interrogation no A-fib LDL 59 HgbA1c 5.1 VTE prophylaxis - SCDs aspirin 81 mg daily prior to admission, now on Eliquis  Therapy recommendations: Outpatient OT, no PT or SLP Disposition:  pending   Chronic Systolic Heart Failure Follows with HF team as outpt Current echo showed EF < 20% HF team consulted Put on digoxin GDMT per cardiology Digoxin 0.125  Farxiga 10  Mexilitene Lasix 40 mg - originally PRN RHC on 6/28- he is fluid overloaded - lasix started On eliquis  Hx of hypertension Hypotension GDMT per caridology BP 90-100s Close monitoring  Cardiac Sarcoidosis Hx of VT Continue mexilitene ICD interrogation no afib  CAD Home meds: Coreg 3.125, Lasix 40mg , Lostartan 25mg , Aldactone 12.5, Farxiga 10mg , mexilitene Hypotensive Cardiology consulted, appreciate help Follows with Dr. Gala Romney who saw him inpatient as well - recommends oral anticoagulation  - Cardiac MRI - Digoxin 0.165mcg - Hold SGLT2i until closer to discharge  Hyperlipidemia Home meds:  Atorvastatin 80mg  LDL 59, goal < 70 On lipitor 80 Continue statin at discharge  Diabetes type II Controlled Home meds:  Farxiga HgbA1c 5.1, goal < 7.0 CBGs SSI Recommend close follow-up with PCP for better DM control  Other Stroke Risk Factors Obstructive sleep apnea CO2 retention, used bipap overnight inpatient Need outpt sleep study done   Hospital day # 5  I have personally obtained history,examined this patient, reviewed notes, independently viewed imaging studies, participated in medical decision making and plan of care.ROS completed by me personally and pertinent positives fully documented  I have made any additions or clarifications directly to the above note. Agree with note above.  Continue management of congestive heart failure as per cardiology.   Mobilize out of bed.  Transfer outside ICU to neurology floor bed.  Continue Eliquis for stroke prevention .  Greater than 50% time during this 50-minute visit was spent on counseling and coordination of care about his embolic stroke and discussion about stroke prevention and treatment and answering questions and discussion with care team.      Charles Heady, MD Medical Director [facility] Pager: [phone] 07/15/2022 12:20 PM    Stroke Neurology 07/15/2022 12:17 PM

## 2022-07-15 NOTE — Progress Notes (Signed)
Patient ID: Charles Holmes, male   DOB: April 02, 1965, 57 y.o.   MRN: [id number]     Advanced Heart Failure Rounding Note  PCP-Cardiologist: Norman Herrlich, MD  HF: Dr. Gala Romney  Subjective:    Patient says that he diuresed well with Lasix last night. No weight yet today and has not had Lasix today.   Objective:    Echo 07/12/22:  EF < 20%, grade III DD, RV okay, moderate pericardial effusion, moderate MR, moderate TR  Weight Range: 79.4 kg Body mass index is 27.42 kg/m.   Vital Signs:   Temp:  [98 F (36.7 C)-98.9 F (37.2 C)] 98 F (36.7 C) (06/29 0741) Pulse Rate:  [90-108] 108 (06/29 0741) Resp:  [16-20] 20 (06/29 0741) BP: (97-128)/(74-91) 97/75 (06/29 0741) SpO2:  [93 %-96 %] 95 % (06/29 0400) Last BM Date : 07/13/22  Weight change: Filed Weights   07/12/22 0500 07/13/22 0446 07/14/22 0536  Weight: 82.1 kg 79 kg 79.4 kg   Intake/Output:   Intake/Output Summary (Last 24 hours) at 07/15/2022 1030 Last data filed at 07/14/2022 2315 Gross per 24 hour  Intake 600 ml  Output 1650 ml  Net -1050 ml    Physical Exam  General: NAD Neck: JVP 10-12 cm with HJR, no thyromegaly or thyroid nodule.  Lungs: Clear to auscultation bilaterally with normal respiratory effort. CV: Nondisplaced PMI.  Heart regular S1/S2, no S3/S4, no murmur.  1+ ankle edema.   Abdomen: Soft, nontender, no hepatosplenomegaly, no distention.  Skin: Intact without lesions or rashes.  Neurologic: Alert and oriented x 3.  Psych: Normal affect. Extremities: No clubbing or cyanosis.  HEENT: Normal.   Telemetry   NSR 80s, occasional PVCs (Personally reviewed)    Labs    CBC Recent Labs    07/14/22 0004 07/14/22 0926 07/15/22 0015  WBC 10.0  --  10.6*  HGB 10.6* 11.2* 11.2*  HCT 32.9* 33.0* 34.9*  MCV 105.8*  --  105.8*  PLT 142*  --  160   Basic Metabolic Panel Recent Labs    82/95/62 0004 07/14/22 0926 07/15/22 0015  NA 134* 138 137  K 3.6 3.7 4.4  CL 94*  --  93*  CO2 32  --   33*  GLUCOSE 122*  --  107*  BUN 25*  --  23*  CREATININE 1.10  --  1.07  CALCIUM 8.4*  --  8.7*  MG 2.0  --  1.8   Liver Function Tests No results for input(s): "AST", "ALT", "ALKPHOS", "BILITOT", "PROT", "ALBUMIN" in the last 72 hours.  No results for input(s): "LIPASE", "AMYLASE" in the last 72 hours. Cardiac Enzymes No results for input(s): "CKTOTAL", "CKMB", "CKMBINDEX", "TROPONINI" in the last 72 hours.  BNP: BNP (last 3 results) Recent Labs    11/07/21 1457 07/11/22 0209  BNP 284.0* 1,588.2*    ProBNP (last 3 results) No results for input(s): "PROBNP" in the last 8760 hours.   D-Dimer No results for input(s): "DDIMER" in the last 72 hours. Hemoglobin A1C No results for input(s): "HGBA1C" in the last 72 hours.  Fasting Lipid Panel No results for input(s): "CHOL", "HDL", "LDLCALC", "TRIG", "CHOLHDL", "LDLDIRECT" in the last 72 hours.  Thyroid Function Tests No results for input(s): "TSH", "T4TOTAL", "T3FREE", "THYROIDAB" in the last 72 hours.  Invalid input(s): "FREET3"   Other results:   Imaging    No results found.   Medications:     Scheduled Medications:  apixaban  5 mg Oral BID   atorvastatin  80 mg Oral Daily   Chlorhexidine Gluconate Cloth  6 each Topical Daily   dapagliflozin propanediol  10 mg Oral Daily   digoxin  0.125 mg Oral Daily   folic acid  1 mg Oral Daily   furosemide  80 mg Intravenous BID   methotrexate  20 mg Oral Weekly   mexiletine  250 mg Oral Q12H   sodium chloride flush  3 mL Intravenous Q12H   spironolactone  12.5 mg Oral Daily    Infusions:  sodium chloride     magnesium sulfate bolus IVPB      PRN Medications: sodium chloride, acetaminophen, ondansetron (ZOFRAN) IV, mouth rinse, polyethylene glycol, senna-docusate, sodium chloride flush    Assessment/Plan  1. Acute Stroke - Admitted with acute L sided weakness, CTA revealed an acute right M1 occlusion. S/p TPA and mechanical clot extraction. No residual  deficits  -Likely cardioembolic in setting of severe LV dysfunction, apixaban has been started.  -No AF on tele/ device check. Reviewed with [employer] rep.  2. Acute on Chronic Systolic HF - Diagnosed 11/2019. Presented with VT. LHC 70% LAD  - Echo EF 25-30%  - cMRI 12/21 concerning for sarcoid and EF 18%.  - PET 2/22 at [facility] EF 25% + active sarcoid - Echo 08/26/20 EF < 20% severely dilated LV RV mildly decreased.  - CPX 8/22. Unable to complete PFTs due to hypoventilation and poor effort. pVO2 16.1 (49% predicted). Slope 19 RER: 1.17 PETCO2 59 - CRT-D upgrade in 06/08/21 - Echo 11/07/21: EF 20-25% RV mild to moderately reduced.  - Echo 07/10/22: EF <20%, RV okay, mod pericardial effusion, mod MR/TR - NYHA IIIb, volume still slightly elevated. Improving w/ diuresis. Will start PO vs IV diuresis post cath. Will wait to see what RHC shows. - GDMT on hold with soft BP following recent CVA - continue digoxin 0.125 mcg daily - RHC 6/28 showed mean RA 9, PA 58/26, mean PCWP 30, CI 2.6.  IV Lasix restarted.  - Still think he has mild volume overload at least on exam today, will continue Lasix 80 mg IV bid today and likely to po tomorrow.  - Add Farxiga 10 mg daily and spironolactone 12.5 mg daily.  - Suspect will need to consider VAD/transplant workup in near future. Previously considered for advanced HF therapies but lung function was limiting based on CPX results 2022. F/u PFT were slightly better. Can revisit candidacy for advanced HF therapies as outpatient.   3. 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 Continue mexilitene  - now s/p ICD.  - Keep K > 4.0 Mg > 2.0 => give Mg today.   4. CAD - LHC 12/07/19 70-% LAD, no intervention - No s/s ischemia, denies CP - Continue statin and aspirin.  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 Now on MTX 20 weekly. Continue folic acid. - We discussed EM biopsy  but patient is reluctant  - Needs repeat PET, wasn't able to schedule 10/23 as insurance would not cover. Now has medicaid and medicare   6. Pericardial effusion - Mod on echo this admit - will follow  7. Iron deficiency anemia - T sat 13% - Would benefit from feraheme prior to discharge. Wait until after cardiac MRI completed (will be outpatient if goes home tomorrow).  8. Noncompliance -Missed some doses of medications prior to admission after running out. -  Forgets meds at times, stopped farxiga d/t cost.  -Reviewed with PharmD. Now has medicare and medicaid. Cost should no longer be an issue  Would continue diuresis today, probably can go home tomorrow.   Length of Stay: 5  Marca Ancona, MD  07/15/2022, 10:30 AM  Advanced Heart Failure Team Pager [phone] (M-F; 7a - 5p)  Please contact [facility] for night-coverage after hours (5p -7a ) and weekends on [email or web address]

## 2022-07-16 DIAGNOSIS — I63312 Cerebral infarction due to thrombosis of left middle cerebral artery: Secondary | ICD-10-CM

## 2022-07-16 DIAGNOSIS — I5043 Acute on chronic combined systolic (congestive) and diastolic (congestive) heart failure: Secondary | ICD-10-CM | POA: Diagnosis not present

## 2022-07-16 LAB — CBC
HCT: 37.5 % — ABNORMAL LOW (ref 39.0–52.0)
Hemoglobin: 11.9 g/dL — ABNORMAL LOW (ref 13.0–17.0)
MCH: 32.7 pg (ref 26.0–34.0)
MCHC: 31.7 g/dL (ref 30.0–36.0)
MCV: 103 fL — ABNORMAL HIGH (ref 80.0–100.0)
Platelets: 205 10*3/uL (ref 150–400)
RBC: 3.64 MIL/uL — ABNORMAL LOW (ref 4.22–5.81)
RDW: 14.9 % (ref 11.5–15.5)
WBC: 11.2 10*3/uL — ABNORMAL HIGH (ref 4.0–10.5)
nRBC: 0 % (ref 0.0–0.2)

## 2022-07-16 LAB — BASIC METABOLIC PANEL
Anion gap: 10 (ref 5–15)
BUN: 22 mg/dL — ABNORMAL HIGH (ref 6–20)
CO2: 35 mmol/L — ABNORMAL HIGH (ref 22–32)
Calcium: 8.9 mg/dL (ref 8.9–10.3)
Chloride: 93 mmol/L — ABNORMAL LOW (ref 98–111)
Creatinine, Ser: 1.14 mg/dL (ref 0.61–1.24)
GFR, Estimated: >60 mL/min (ref >60)
Glucose, Bld: 116 mg/dL — ABNORMAL HIGH (ref 70–99)
Potassium: 3.6 mmol/L (ref 3.5–5.1)
Sodium: 138 mmol/L (ref 135–145)

## 2022-07-16 LAB — MAGNESIUM: Magnesium: 2.2 mg/dL (ref 1.7–2.4)

## 2022-07-16 MED ORDER — LOSARTAN POTASSIUM 25 MG PO TABS: 12.5000 mg | ORAL_TABLET | Freq: Every day | ORAL | 1 refills | Status: DC

## 2022-07-16 MED ORDER — FUROSEMIDE 40 MG PO TABS: 40.0000 mg | ORAL_TABLET | Freq: Every day | ORAL | Status: DC

## 2022-07-16 MED ORDER — POTASSIUM CHLORIDE CRYS ER 20 MEQ PO TBCR
40.0000 meq | EXTENDED_RELEASE_TABLET | Freq: Once | ORAL | Status: AC
  Administered 2022-07-16: 40 meq via ORAL
  Filled 2022-07-16: qty 2

## 2022-07-16 MED ORDER — SPIRONOLACTONE 25 MG PO TABS
25.0000 mg | ORAL_TABLET | Freq: Every day | ORAL | Status: DC
  Administered 2022-07-16: 25 mg via ORAL
  Filled 2022-07-16: qty 1

## 2022-07-16 MED ORDER — DIGOXIN 125 MCG PO TABS: 0.1250 mg | ORAL_TABLET | Freq: Every day | ORAL | 1 refills | Status: DC

## 2022-07-16 MED ORDER — FUROSEMIDE 40 MG PO TABS: 40.0000 mg | ORAL_TABLET | Freq: Every day | ORAL | 1 refills | Status: DC

## 2022-07-16 MED ORDER — LOSARTAN POTASSIUM 25 MG PO TABS
12.5000 mg | ORAL_TABLET | Freq: Every day | ORAL | Status: DC
  Administered 2022-07-16: 12.5 mg via ORAL
  Filled 2022-07-16: qty 1

## 2022-07-16 NOTE — Progress Notes (Signed)
Patient ID: Charles Holmes, male   DOB: [birth year], 57 y.o.   MRN: [id number]     Advanced Heart Failure Rounding Note  PCP-Cardiologist: Norman Herrlich, MD  HF: Dr. Gala Romney  Subjective:    Good diuresis yesterday with IV Lasix, weight down.    Objective:    Echo 07/12/22:  EF < 20%, grade III DD, RV okay, moderate pericardial effusion, moderate MR, moderate TR  Weight Range: 72.6 kg Body mass index is 25.07 kg/m.   Vital Signs:   Temp:  [98 F (36.7 C)-98.7 F (37.1 C)] 98.7 F (37.1 C) (06/30 0727) Pulse Rate:  [94-102] 94 (06/30 1019) Resp:  [16-25] 17 (06/30 0727) BP: (100-128)/(71-90) 100/71 (06/30 0727) SpO2:  [94 %-97 %] 95 % (06/30 0727) Weight:  [72.6 kg] 72.6 kg (06/30 0500) Last BM Date : 07/13/22  Weight change: Filed Weights   07/13/22 0446 07/14/22 0536 07/16/22 0500  Weight: 79 kg 79.4 kg 72.6 kg   Intake/Output:   Intake/Output Summary (Last 24 hours) at 07/16/2022 1022 Last data filed at 07/16/2022 0941 Gross per 24 hour  Intake 270.13 ml  Output 4490 ml  Net -4219.87 ml    Physical Exam  General: NAD Neck: No JVD, no thyromegaly or thyroid nodule.  Lungs: Clear to auscultation bilaterally with normal respiratory effort. CV: Nondisplaced PMI.  Heart regular S1/S2, no S3/S4, no murmur.  No peripheral edema.  Abdomen: Soft, nontender, no hepatosplenomegaly, no distention.  Skin: Intact without lesions or rashes.  Neurologic: Alert and oriented x 3.  Psych: Normal affect. Extremities: No clubbing or cyanosis.  HEENT: Normal.    Telemetry   NSR 80s, occasional PVCs (Personally reviewed)    Labs    CBC Recent Labs    07/15/22 0015 07/16/22 0020  WBC 10.6* 11.2*  HGB 11.2* 11.9*  HCT 34.9* 37.5*  MCV 105.8* 103.0*  PLT 160 205   Basic Metabolic Panel Recent Labs    40/98/11 0015 07/16/22 0020  NA 137 138  K 4.4 3.6  CL 93* 93*  CO2 33* 35*  GLUCOSE 107* 116*  BUN 23* 22*  CREATININE 1.07 1.14  CALCIUM 8.7* 8.9  MG 1.8  2.2   Liver Function Tests No results for input(s): "AST", "ALT", "ALKPHOS", "BILITOT", "PROT", "ALBUMIN" in the last 72 hours.  No results for input(s): "LIPASE", "AMYLASE" in the last 72 hours. Cardiac Enzymes No results for input(s): "CKTOTAL", "CKMB", "CKMBINDEX", "TROPONINI" in the last 72 hours.  BNP: BNP (last 3 results) Recent Labs    11/07/21 1457 07/11/22 0209  BNP 284.0* 1,588.2*    ProBNP (last 3 results) No results for input(s): "PROBNP" in the last 8760 hours.   D-Dimer No results for input(s): "DDIMER" in the last 72 hours. Hemoglobin A1C No results for input(s): "HGBA1C" in the last 72 hours.  Fasting Lipid Panel No results for input(s): "CHOL", "HDL", "LDLCALC", "TRIG", "CHOLHDL", "LDLDIRECT" in the last 72 hours.  Thyroid Function Tests No results for input(s): "TSH", "T4TOTAL", "T3FREE", "THYROIDAB" in the last 72 hours.  Invalid input(s): "FREET3"   Other results:   Imaging    No results found.   Medications:     Scheduled Medications:  apixaban  5 mg Oral BID   atorvastatin  80 mg Oral Daily   Chlorhexidine Gluconate Cloth  6 each Topical Daily   dapagliflozin propanediol  10 mg Oral Daily   digoxin  0.125 mg Oral Daily   folic acid  1 mg Oral Daily   [START ON  07/17/2022] furosemide  40 mg Oral Daily   losartan  12.5 mg Oral Daily   methotrexate  20 mg Oral Weekly   mexiletine  250 mg Oral Q12H   potassium chloride  40 mEq Oral Once   sodium chloride flush  3 mL Intravenous Q12H   spironolactone  25 mg Oral Daily    Infusions:  sodium chloride      PRN Medications: sodium chloride, acetaminophen, ondansetron (ZOFRAN) IV, mouth rinse, polyethylene glycol, senna-docusate, sodium chloride flush    Assessment/Plan  1. Acute Stroke - Admitted with acute L sided weakness, CTA revealed an acute right M1 occlusion. S/p TPA and mechanical clot extraction. No residual deficits  -Likely cardioembolic in setting of severe LV  dysfunction, apixaban has been started.  -No AF on tele/ device check. Reviewed with [employer] rep.  2. Acute on Chronic Systolic HF - Diagnosed 11/2019. Presented with VT. LHC 70% LAD  - Echo EF 25-30%  - cMRI 12/21 concerning for sarcoid and EF 18%.  - PET 2/22 at [facility] EF 25% + active sarcoid - Echo 08/26/20 EF < 20% severely dilated LV RV mildly decreased.  - CPX 8/22. Unable to complete PFTs due to hypoventilation and poor effort. pVO2 16.1 (49% predicted). Slope 19 RER: 1.17 PETCO2 59 - CRT-D upgrade in 06/08/21 - Echo 11/07/21: EF 20-25% RV mild to moderately reduced.  - Echo 07/10/22: EF <20%, RV okay, mod pericardial effusion, mod MR/TR - NYHA IIIb, volume still slightly elevated. Improving w/ diuresis. Will start PO vs IV diuresis post cath. Will wait to see what RHC shows. - GDMT on hold with soft BP following recent CVA - continue digoxin 0.125 mcg daily - RHC 6/28 showed mean RA 9, PA 58/26, mean PCWP 30, CI 2.6.  IV Lasix restarted.  - Good diuresis, can stop IV Lasix and start Lasix 40 mg po daily tomorrow.   - Continue Farxiga 10 mg daily and increase spironolactone to 25 mg daily.  - Can restart losartan at 12.5 daily.  Possible transition to [facility] as outpatient.  - Suspect will need to consider VAD/transplant workup in near future. Previously considered for advanced HF therapies but lung function was limiting based on CPX results 2022. F/u PFT were slightly better. Can revisit candidacy for advanced HF therapies as outpatient.   3. 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 Continue mexilitene  - now s/p ICD.  - Keep K > 4.0 Mg > 2.0 => Give K today.   4. CAD - LHC 12/07/19 70-% LAD, no intervention - No s/s ischemia, denies CP - Continue statin and aspirin.  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 Now on MTX 20 weekly. Continue folic acid. - We discussed EM biopsy but  patient is reluctant  - Needs repeat PET, wasn't able to schedule 10/23 as insurance would not cover. Now has medicaid and medicare   6. Pericardial effusion - Mod on echo this admit - will follow  7. Iron deficiency anemia - T sat 13% - Would benefit from feraheme eventually. Wait until after cardiac MRI completed (will be outpatient as going home today).  8. Noncompliance -Missed some doses of medications prior to admission after running out. -Forgets meds at times, stopped farxiga d/t cost.  -Reviewed with PharmD. Now has medicare and medicaid. Cost should no longer be an issue  I think  he can go home today.  Followup in CHF clinic.  Cardiac meds for home: Lasix 40 mg daily, KCl 20 daily, spironolactone 25 daily, losartan 12.5 daily, Farxiga 10 daily, Eliquis 5 bid, mexiletine 250 bid, digoxin 0.125, atorvastatin 80, methotrexate as prior to admission.   Length of Stay: 6  Marca Ancona, MD  07/16/2022, 10:22 AM  Advanced Heart Failure Team Pager [phone] (M-F; 7a - 5p)  Please contact [facility] for night-coverage after hours (5p -7a ) and weekends on [email or web address]

## 2022-07-16 NOTE — Progress Notes (Signed)
Mobility Specialist Progress Note:   07/16/22 1020  Mobility  Activity Ambulated independently in hallway  Level of Assistance Standby assist, set-up cues, supervision of patient - no hands on  Assistive Device None  Distance Ambulated (ft) 400 ft  Activity Response Tolerated well  Mobility Referral Yes  $Mobility charge 1 Mobility  Mobility Specialist Start Time (ACUTE ONLY) [id number]  Mobility Specialist Stop Time (ACUTE ONLY) 1007  Mobility Specialist Time Calculation (min) (ACUTE ONLY) 9 min   Received pt in chair having no complaints and agreeable to mobility. Pt was asymptomatic throughout ambulation and returned to room w/o fault. Left in chair w/ call bell in reach and all needs met.  Pre Mobility HR 100 During Mobility HR 107 Post Mobility HR 105  Thompson Grayer Mobility Specialist  Please contact vis Secure Chat or  Rehab Office [phone]

## 2022-07-16 NOTE — Plan of Care (Signed)
  Problem: Education: Goal: Knowledge of disease or condition will improve Outcome: Progressing Goal: Knowledge of secondary prevention will improve (MUST DOCUMENT ALL) Outcome: Progressing Goal: Knowledge of patient specific risk factors will improve (Mark N/A or DELETE if not current risk factor) Outcome: Progressing   Problem: Ischemic Stroke/TIA Tissue Perfusion: Goal: Complications of ischemic stroke/TIA will be minimized Outcome: Progressing   Problem: Coping: Goal: Will verbalize positive feelings about self Outcome: Progressing Goal: Will identify appropriate support needs Outcome: Progressing   Problem: Health Behavior/Discharge Planning: Goal: Ability to manage health-related needs will improve Outcome: Progressing Goal: Goals will be collaboratively established with patient/family Outcome: Progressing   Problem: Self-Care: Goal: Ability to participate in self-care as condition permits will improve Outcome: Progressing Goal: Verbalization of feelings and concerns over difficulty with self-care will improve Outcome: Progressing Goal: Ability to communicate needs accurately will improve Outcome: Progressing   

## 2022-07-16 NOTE — Plan of Care (Signed)
Problem: Education: Goal: Understanding of CV disease, CV risk reduction, and recovery process will improve 07/16/2022 1318 by Carie Caddy, RN Outcome: Adequate for Discharge 07/16/2022 1318 by Carie Caddy, RN Outcome: Adequate for Discharge 07/16/2022 1318 by Carie Caddy, RN Outcome: Adequate for Discharge Goal: Individualized Educational Video(s) 07/16/2022 1318 by Carie Caddy, RN Outcome: Adequate for Discharge 07/16/2022 1318 by Carie Caddy, RN Outcome: Adequate for Discharge 07/16/2022 1318 by Carie Caddy, RN Outcome: Adequate for Discharge   Problem: Activity: Goal: Ability to return to baseline activity level will improve 07/16/2022 1318 by Carie Caddy, RN Outcome: Adequate for Discharge 07/16/2022 1318 by Carie Caddy, RN Outcome: Adequate for Discharge 07/16/2022 1318 by Carie Caddy, RN Outcome: Adequate for Discharge   Problem: Cardiovascular: Goal: Ability to achieve and maintain adequate cardiovascular perfusion will improve 07/16/2022 1318 by Carie Caddy, RN Outcome: Adequate for Discharge 07/16/2022 1318 by Carie Caddy, RN Outcome: Adequate for Discharge 07/16/2022 1318 by Carie Caddy, RN Outcome: Adequate for Discharge Goal: Vascular access site(s) Level 0-1 will be maintained 07/16/2022 1318 by Carie Caddy, RN Outcome: Adequate for Discharge 07/16/2022 1318 by Carie Caddy, RN Outcome: Adequate for Discharge 07/16/2022 1318 by Carie Caddy, RN Outcome: Adequate for Discharge   Problem: Health Behavior/Discharge Planning: Goal: Ability to safely manage health-related needs after discharge will improve 07/16/2022 1318 by Carie Caddy, RN Outcome: Adequate for Discharge 07/16/2022 1318 by Carie Caddy, RN Outcome: Adequate for Discharge 07/16/2022 1318 by Carie Caddy, RN Outcome: Adequate for Discharge   Problem:  Education: Goal: Knowledge of disease or condition will improve 07/16/2022 1318 by Carie Caddy, RN Outcome: Adequate for Discharge 07/16/2022 1318 by Carie Caddy, RN Outcome: Adequate for Discharge 07/16/2022 1318 by Carie Caddy, RN Outcome: Adequate for Discharge Goal: Knowledge of secondary prevention will improve (MUST DOCUMENT ALL) 07/16/2022 1318 by Carie Caddy, RN Outcome: Adequate for Discharge 07/16/2022 1318 by Carie Caddy, RN Outcome: Adequate for Discharge 07/16/2022 1318 by Carie Caddy, RN Outcome: Adequate for Discharge Goal: Knowledge of patient specific risk factors will improve Charles Holmes N/A or DELETE if not current risk factor) 07/16/2022 1318 by Carie Caddy, RN Outcome: Adequate for Discharge 07/16/2022 1318 by Carie Caddy, RN Outcome: Adequate for Discharge 07/16/2022 1318 by Carie Caddy, RN Outcome: Adequate for Discharge   Problem: Ischemic Stroke/TIA Tissue Perfusion: Goal: Complications of ischemic stroke/TIA will be minimized 07/16/2022 1318 by Carie Caddy, RN Outcome: Adequate for Discharge 07/16/2022 1318 by Carie Caddy, RN Outcome: Adequate for Discharge 07/16/2022 1318 by Carie Caddy, RN Outcome: Adequate for Discharge   Problem: Coping: Goal: Will verbalize positive feelings about self 07/16/2022 1318 by Carie Caddy, RN Outcome: Adequate for Discharge 07/16/2022 1318 by Carie Caddy, RN Outcome: Adequate for Discharge 07/16/2022 1318 by Carie Caddy, RN Outcome: Adequate for Discharge Goal: Will identify appropriate support needs 07/16/2022 1318 by Carie Caddy, RN Outcome: Adequate for Discharge 07/16/2022 1318 by Carie Caddy, RN Outcome: Adequate for Discharge 07/16/2022 1318 by Carie Caddy, RN Outcome: Adequate for Discharge   Problem: Health Behavior/Discharge Planning: Goal: Ability to manage  health-related needs will improve 07/16/2022 1318 by Carie Caddy, RN Outcome: Adequate for Discharge 07/16/2022 1318 by Carie Caddy, RN Outcome: Adequate for Discharge 07/16/2022 1318 by Carie Caddy, RN Outcome: Adequate for Discharge Goal: Goals will be collaboratively established with patient/family 07/16/2022  1318 by Carie Caddy, RN Outcome: Adequate for Discharge 07/16/2022 1318 by Carie Caddy, RN Outcome: Adequate for Discharge 07/16/2022 1318 by Carie Caddy, RN Outcome: Adequate for Discharge   Problem: Self-Care: Goal: Ability to participate in self-care as condition permits will improve 07/16/2022 1318 by Carie Caddy, RN Outcome: Adequate for Discharge 07/16/2022 1318 by Carie Caddy, RN Outcome: Adequate for Discharge 07/16/2022 1318 by Carie Caddy, RN Outcome: Adequate for Discharge Goal: Verbalization of feelings and concerns over difficulty with self-care will improve 07/16/2022 1318 by Carie Caddy, RN Outcome: Adequate for Discharge 07/16/2022 1318 by Carie Caddy, RN Outcome: Adequate for Discharge 07/16/2022 1318 by Carie Caddy, RN Outcome: Adequate for Discharge Goal: Ability to communicate needs accurately will improve 07/16/2022 1318 by Carie Caddy, RN Outcome: Adequate for Discharge 07/16/2022 1318 by Carie Caddy, RN Outcome: Adequate for Discharge 07/16/2022 1318 by Carie Caddy, RN Outcome: Adequate for Discharge   Problem: Nutrition: Goal: Risk of aspiration will decrease 07/16/2022 1318 by Carie Caddy, RN Outcome: Adequate for Discharge 07/16/2022 1318 by Carie Caddy, RN Outcome: Adequate for Discharge 07/16/2022 1318 by Carie Caddy, RN Outcome: Adequate for Discharge Goal: Dietary intake will improve 07/16/2022 1318 by Carie Caddy, RN Outcome: Adequate for Discharge 07/16/2022 1318 by Carie Caddy, RN Outcome: Adequate for Discharge 07/16/2022 1318 by Carie Caddy, RN Outcome: Adequate for Discharge   Problem: Education: Goal: Knowledge of disease or condition will improve 07/16/2022 1318 by Carie Caddy, RN Outcome: Adequate for Discharge 07/16/2022 1318 by Carie Caddy, RN Outcome: Adequate for Discharge 07/16/2022 1318 by Carie Caddy, RN Outcome: Adequate for Discharge Goal: Knowledge of secondary prevention will improve (MUST DOCUMENT ALL) 07/16/2022 1318 by Carie Caddy, RN Outcome: Adequate for Discharge 07/16/2022 1318 by Carie Caddy, RN Outcome: Adequate for Discharge 07/16/2022 1318 by Carie Caddy, RN Outcome: Adequate for Discharge Goal: Knowledge of patient specific risk factors will improve Charles Holmes N/A or DELETE if not current risk factor) 07/16/2022 1318 by Carie Caddy, RN Outcome: Adequate for Discharge 07/16/2022 1318 by Carie Caddy, RN Outcome: Adequate for Discharge 07/16/2022 1318 by Carie Caddy, RN Outcome: Adequate for Discharge   Problem: Ischemic Stroke/TIA Tissue Perfusion: Goal: Complications of ischemic stroke/TIA will be minimized 07/16/2022 1318 by Carie Caddy, RN Outcome: Adequate for Discharge 07/16/2022 1318 by Carie Caddy, RN Outcome: Adequate for Discharge 07/16/2022 1318 by Carie Caddy, RN Outcome: Adequate for Discharge   Problem: Coping: Goal: Will verbalize positive feelings about self 07/16/2022 1318 by Carie Caddy, RN Outcome: Adequate for Discharge 07/16/2022 1318 by Carie Caddy, RN Outcome: Adequate for Discharge 07/16/2022 1318 by Carie Caddy, RN Outcome: Adequate for Discharge Goal: Will identify appropriate support needs 07/16/2022 1318 by Carie Caddy, RN Outcome: Adequate for Discharge 07/16/2022 1318 by Carie Caddy, RN Outcome: Adequate for Discharge 07/16/2022 1318 by  Carie Caddy, RN Outcome: Adequate for Discharge   Problem: Health Behavior/Discharge Planning: Goal: Ability to manage health-related needs will improve 07/16/2022 1318 by Carie Caddy, RN Outcome: Adequate for Discharge 07/16/2022 1318 by Carie Caddy, RN Outcome: Adequate for Discharge 07/16/2022 1318 by Carie Caddy, RN Outcome: Adequate for Discharge Goal: Goals will be collaboratively established with patient/family 07/16/2022 1318 by Carie Caddy, RN Outcome: Adequate for Discharge 07/16/2022 1318 by Carie Caddy, RN Outcome: Adequate for Discharge 07/16/2022 1318 by [place],  Verta Ellen, RN Outcome: Adequate for Discharge   Problem: Self-Care: Goal: Ability to participate in self-care as condition permits will improve 07/16/2022 1318 by Carie Caddy, RN Outcome: Adequate for Discharge 07/16/2022 1318 by Carie Caddy, RN Outcome: Adequate for Discharge 07/16/2022 1318 by Carie Caddy, RN Outcome: Adequate for Discharge Goal: Verbalization of feelings and concerns over difficulty with self-care will improve 07/16/2022 1318 by Carie Caddy, RN Outcome: Adequate for Discharge 07/16/2022 1318 by Carie Caddy, RN Outcome: Adequate for Discharge 07/16/2022 1318 by Carie Caddy, RN Outcome: Adequate for Discharge Goal: Ability to communicate needs accurately will improve 07/16/2022 1318 by Carie Caddy, RN Outcome: Adequate for Discharge 07/16/2022 1318 by Carie Caddy, RN Outcome: Adequate for Discharge 07/16/2022 1318 by Carie Caddy, RN Outcome: Adequate for Discharge   Problem: Nutrition: Goal: Risk of aspiration will decrease 07/16/2022 1318 by Carie Caddy, RN Outcome: Adequate for Discharge 07/16/2022 1318 by Carie Caddy, RN Outcome: Adequate for Discharge 07/16/2022 1318 by Carie Caddy, RN Outcome: Adequate for Discharge Goal:  Dietary intake will improve 07/16/2022 1318 by Carie Caddy, RN Outcome: Adequate for Discharge 07/16/2022 1318 by Carie Caddy, RN Outcome: Adequate for Discharge 07/16/2022 1318 by Carie Caddy, RN Outcome: Adequate for Discharge   Problem: Education: Goal: Knowledge of General Education information will improve Description: Including pain rating scale, medication(s)/side effects and non-pharmacologic comfort measures 07/16/2022 1318 by Carie Caddy, RN Outcome: Adequate for Discharge 07/16/2022 1318 by Carie Caddy, RN Outcome: Adequate for Discharge 07/16/2022 1318 by Carie Caddy, RN Outcome: Adequate for Discharge   Problem: Health Behavior/Discharge Planning: Goal: Ability to manage health-related needs will improve 07/16/2022 1318 by Carie Caddy, RN Outcome: Adequate for Discharge 07/16/2022 1318 by Carie Caddy, RN Outcome: Adequate for Discharge 07/16/2022 1318 by Carie Caddy, RN Outcome: Adequate for Discharge   Problem: Clinical Measurements: Goal: Ability to maintain clinical measurements within normal limits will improve 07/16/2022 1318 by Carie Caddy, RN Outcome: Adequate for Discharge 07/16/2022 1318 by Carie Caddy, RN Outcome: Adequate for Discharge 07/16/2022 1318 by Carie Caddy, RN Outcome: Adequate for Discharge Goal: Will remain free from infection 07/16/2022 1318 by Carie Caddy, RN Outcome: Adequate for Discharge 07/16/2022 1318 by Carie Caddy, RN Outcome: Adequate for Discharge 07/16/2022 1318 by Carie Caddy, RN Outcome: Adequate for Discharge Goal: Diagnostic test results will improve 07/16/2022 1318 by Carie Caddy, RN Outcome: Adequate for Discharge 07/16/2022 1318 by Carie Caddy, RN Outcome: Adequate for Discharge 07/16/2022 1318 by Carie Caddy, RN Outcome: Adequate for Discharge Goal: Respiratory  complications will improve 07/16/2022 1318 by Carie Caddy, RN Outcome: Adequate for Discharge 07/16/2022 1318 by Carie Caddy, RN Outcome: Adequate for Discharge 07/16/2022 1318 by Carie Caddy, RN Outcome: Adequate for Discharge Goal: Cardiovascular complication will be avoided 07/16/2022 1318 by Carie Caddy, RN Outcome: Adequate for Discharge 07/16/2022 1318 by Carie Caddy, RN Outcome: Adequate for Discharge 07/16/2022 1318 by Carie Caddy, RN Outcome: Adequate for Discharge   Problem: Activity: Goal: Risk for activity intolerance will decrease 07/16/2022 1318 by Carie Caddy, RN Outcome: Adequate for Discharge 07/16/2022 1318 by Carie Caddy, RN Outcome: Adequate for Discharge 07/16/2022 1318 by Carie Caddy, RN Outcome: Adequate for Discharge   Problem: Nutrition: Goal: Adequate nutrition will be maintained 07/16/2022 1318 by Carie Caddy, RN Outcome: Adequate for Discharge 07/16/2022 1318 by [place],  Verta Ellen, RN Outcome: Adequate for Discharge 07/16/2022 1318 by Carie Caddy, RN Outcome: Adequate for Discharge   Problem: Coping: Goal: Level of anxiety will decrease 07/16/2022 1318 by Carie Caddy, RN Outcome: Adequate for Discharge 07/16/2022 1318 by Carie Caddy, RN Outcome: Adequate for Discharge 07/16/2022 1318 by Carie Caddy, RN Outcome: Adequate for Discharge   Problem: Elimination: Goal: Will not experience complications related to bowel motility 07/16/2022 1318 by Carie Caddy, RN Outcome: Adequate for Discharge 07/16/2022 1318 by Carie Caddy, RN Outcome: Adequate for Discharge 07/16/2022 1318 by Carie Caddy, RN Outcome: Adequate for Discharge Goal: Will not experience complications related to urinary retention 07/16/2022 1318 by Carie Caddy, RN Outcome: Adequate for Discharge 07/16/2022 1318 by Carie Caddy,  RN Outcome: Adequate for Discharge 07/16/2022 1318 by Carie Caddy, RN Outcome: Adequate for Discharge   Problem: Pain Managment: Goal: General experience of comfort will improve 07/16/2022 1318 by Carie Caddy, RN Outcome: Adequate for Discharge 07/16/2022 1318 by Carie Caddy, RN Outcome: Adequate for Discharge 07/16/2022 1318 by Carie Caddy, RN Outcome: Adequate for Discharge   Problem: Safety: Goal: Ability to remain free from injury will improve 07/16/2022 1318 by Carie Caddy, RN Outcome: Adequate for Discharge 07/16/2022 1318 by Carie Caddy, RN Outcome: Adequate for Discharge 07/16/2022 1318 by Carie Caddy, RN Outcome: Adequate for Discharge   Problem: Skin Integrity: Goal: Risk for impaired skin integrity will decrease 07/16/2022 1318 by Carie Caddy, RN Outcome: Adequate for Discharge 07/16/2022 1318 by Carie Caddy, RN Outcome: Adequate for Discharge 07/16/2022 1318 by Carie Caddy, RN Outcome: Adequate for Discharge

## 2022-07-16 NOTE — Discharge Instructions (Signed)

## 2022-07-17 ENCOUNTER — Encounter (HOSPITAL_COMMUNITY): Payer: Self-pay | Admitting: Internal Medicine

## 2022-07-17 LAB — POCT I-STAT 7, (LYTES, BLD GAS, ICA,H+H)
Acid-Base Excess: 9 mmol/L — ABNORMAL HIGH (ref 0.0–2.0)
Acid-Base Excess: 10 mmol/L — ABNORMAL HIGH (ref 0.0–2.0)
Bicarbonate: 36.1 mmol/L — ABNORMAL HIGH (ref 20.0–28.0)
Bicarbonate: 37.7 mmol/L — ABNORMAL HIGH (ref 20.0–28.0)
Calcium, Ion: 1.18 mmol/L (ref 1.15–1.40)
Calcium, Ion: 1.13 mmol/L — ABNORMAL LOW (ref 1.15–1.40)
HCT: 33 % — ABNORMAL LOW (ref 39.0–52.0)
HCT: 33 % — ABNORMAL LOW (ref 39.0–52.0)
Hemoglobin: 11.2 g/dL — ABNORMAL LOW (ref 13.0–17.0)
Hemoglobin: 11.2 g/dL — ABNORMAL LOW (ref 13.0–17.0)
O2 Saturation: 50 %
O2 Saturation: 55 %
Patient temperature: 37
Patient temperature: 37
Potassium: 3.8 mmol/L (ref 3.5–5.1)
Potassium: 3.8 mmol/L (ref 3.5–5.1)
Sodium: 137 mmol/L (ref 135–145)
Sodium: 138 mmol/L (ref 135–145)
TCO2: 38 mmol/L — ABNORMAL HIGH (ref 22–32)
TCO2: 40 mmol/L — ABNORMAL HIGH (ref 22–32)
pCO2 arterial: 62.2 mmHg — ABNORMAL HIGH (ref 32–48)
pCO2 arterial: 64.3 mmHg — ABNORMAL HIGH (ref 32–48)
pH, Arterial: 7.372 (ref 7.35–7.45)
pH, Arterial: 7.376 (ref 7.35–7.45)
pO2, Arterial: 29 mmHg — CL (ref 83–108)
pO2, Arterial: 31 mmHg — CL (ref 83–108)

## 2022-07-18 LAB — LIPOPROTEIN A (LPA): Lipoprotein (a): 84.1 nmol/L — ABNORMAL HIGH (ref <75.0)

## 2022-07-24 ENCOUNTER — Other Ambulatory Visit (HOSPITAL_COMMUNITY): Payer: Self-pay | Admitting: *Deleted

## 2022-07-24 ENCOUNTER — Encounter (HOSPITAL_COMMUNITY): Payer: Self-pay

## 2022-07-24 NOTE — Telephone Encounter (Signed)
Called pt he is aware

## 2022-07-26 ENCOUNTER — Telehealth (HOSPITAL_COMMUNITY): Payer: Self-pay

## 2022-07-26 ENCOUNTER — Other Ambulatory Visit (HOSPITAL_COMMUNITY): Payer: Self-pay

## 2022-07-26 ENCOUNTER — Encounter (HOSPITAL_COMMUNITY): Payer: Self-pay

## 2022-07-26 ENCOUNTER — Ambulatory Visit (HOSPITAL_COMMUNITY)
Admit: 2022-07-26 | Discharge: 2022-07-26 | Disposition: A | Discharge status: Home / Self Care | Payer: Medicare Other | Attending: Cardiology | Admitting: Cardiology

## 2022-07-26 VITALS — BP 102/80 | HR 79 | Wt 169.4 lb

## 2022-07-26 DIAGNOSIS — K219 Gastro-esophageal reflux disease without esophagitis: Secondary | ICD-10-CM | POA: Diagnosis not present

## 2022-07-26 DIAGNOSIS — D8689 Sarcoidosis of other sites: Secondary | ICD-10-CM | POA: Diagnosis not present

## 2022-07-26 DIAGNOSIS — Z95810 Presence of automatic (implantable) cardiac defibrillator: Secondary | ICD-10-CM | POA: Insufficient documentation

## 2022-07-26 DIAGNOSIS — Z8673 Personal history of transient ischemic attack (TIA), and cerebral infarction without residual deficits: Secondary | ICD-10-CM | POA: Insufficient documentation

## 2022-07-26 DIAGNOSIS — I081 Rheumatic disorders of both mitral and tricuspid valves: Secondary | ICD-10-CM | POA: Insufficient documentation

## 2022-07-26 DIAGNOSIS — I2510 Atherosclerotic heart disease of native coronary artery without angina pectoris: Secondary | ICD-10-CM | POA: Diagnosis not present

## 2022-07-26 DIAGNOSIS — Z7901 Long term (current) use of anticoagulants: Secondary | ICD-10-CM | POA: Insufficient documentation

## 2022-07-26 DIAGNOSIS — Z8249 Family history of ischemic heart disease and other diseases of the circulatory system: Secondary | ICD-10-CM | POA: Diagnosis not present

## 2022-07-26 DIAGNOSIS — I5022 Chronic systolic (congestive) heart failure: Secondary | ICD-10-CM

## 2022-07-26 DIAGNOSIS — Z79899 Other long term (current) drug therapy: Secondary | ICD-10-CM | POA: Diagnosis not present

## 2022-07-26 DIAGNOSIS — R531 Weakness: Secondary | ICD-10-CM | POA: Diagnosis not present

## 2022-07-26 DIAGNOSIS — I110 Hypertensive heart disease with heart failure: Secondary | ICD-10-CM | POA: Insufficient documentation

## 2022-07-26 DIAGNOSIS — I428 Other cardiomyopathies: Secondary | ICD-10-CM | POA: Diagnosis not present

## 2022-07-26 DIAGNOSIS — D509 Iron deficiency anemia, unspecified: Secondary | ICD-10-CM | POA: Diagnosis not present

## 2022-07-26 DIAGNOSIS — E785 Hyperlipidemia, unspecified: Secondary | ICD-10-CM | POA: Diagnosis not present

## 2022-07-26 DIAGNOSIS — Z7982 Long term (current) use of aspirin: Secondary | ICD-10-CM | POA: Insufficient documentation

## 2022-07-26 LAB — BASIC METABOLIC PANEL
Anion gap: 10 (ref 5–15)
BUN: 17 mg/dL (ref 6–20)
CO2: 32 mmol/L (ref 22–32)
Calcium: 9.4 mg/dL (ref 8.9–10.3)
Chloride: 98 mmol/L (ref 98–111)
Creatinine, Ser: 1.28 mg/dL — ABNORMAL HIGH (ref 0.61–1.24)
GFR, Estimated: >60 mL/min (ref >60)
Glucose, Bld: 78 mg/dL (ref 70–99)
Potassium: 4.5 mmol/L (ref 3.5–5.1)
Sodium: 140 mmol/L (ref 135–145)

## 2022-07-26 LAB — BRAIN NATRIURETIC PEPTIDE: B Natriuretic Peptide: 1723.6 pg/mL — ABNORMAL HIGH (ref 0.0–100.0)

## 2022-07-26 LAB — DIGOXIN LEVEL: Digoxin Level: 1.3 ng/mL (ref 0.8–2.0)

## 2022-07-26 MED ORDER — APIXABAN 5 MG PO TABS: 5.0000 mg | ORAL_TABLET | Freq: Two times a day (BID) | ORAL | 3 refills | Status: DC

## 2022-07-26 MED ORDER — FUROSEMIDE 40 MG PO TABS: 40.0000 mg | ORAL_TABLET | Freq: Every day | ORAL | 3 refills | Status: DC

## 2022-07-26 MED ORDER — FOLIC ACID 1 MG PO TABS: 1.0000 mg | ORAL_TABLET | Freq: Every day | ORAL | 3 refills | Status: DC

## 2022-07-26 MED ORDER — CARVEDILOL 3.125 MG PO TABS: 3.1250 mg | ORAL_TABLET | Freq: Two times a day (BID) | ORAL | 3 refills | Status: DC

## 2022-07-26 MED ORDER — DIGOXIN 125 MCG PO TABS: 0.1250 mg | ORAL_TABLET | Freq: Every day | ORAL | 3 refills | Status: DC

## 2022-07-26 MED ORDER — METHOTREXATE SODIUM 2.5 MG PO TABS: 20.0000 mg | ORAL_TABLET | ORAL | 3 refills | Status: DC

## 2022-07-26 MED ORDER — ATORVASTATIN CALCIUM 80 MG PO TABS: 80.0000 mg | ORAL_TABLET | Freq: Every day | ORAL | 3 refills | Status: DC

## 2022-07-26 MED ORDER — DAPAGLIFLOZIN PROPANEDIOL 10 MG PO TABS: 10.0000 mg | ORAL_TABLET | Freq: Every day | ORAL | 3 refills | Status: DC

## 2022-07-26 NOTE — Telephone Encounter (Signed)
Cardiac Rehab Referral faxed to [facility]

## 2022-07-26 NOTE — Patient Instructions (Addendum)
There has been no changes to your medications.  Labs done today, your results will be available in MyChart, we will contact you for abnormal readings.   You have been referred to cardiac Rehab at [facility]. They will call you to arrange your appointment.    Expect to be in the lab for 2 hours. Please plan to arrive 30 minutes prior to your appointment. You may be asked to reschedule your test if you arrive 20 minutes or more after your scheduled appointment time.  Main Campus address: [address] [place], [place] [address] You may arrive to the Main Entrance A or Entrance C (free valet parking is available at both). -Main Entrance A (on [address]) :proceed to admitting for check in -Entrance C (on [facility]): proceed to [employer] parking or under hospital deck parking using this code _________  Check In: [facility] waiting room (1st floor)   General Instructions for the day of the test (Please follow all instructions from your physician): Refrain from ingesting a heavy meal, alcohol, or caffeine or using tobacco products within 2 hours of the test (DO NOT FAST for mare than 8 hours). You may have all other non-alcoholic, non -caffeinated beverage,a light snack (crackers,a piece of fruit, carrot sticks, toast bagel,etc) up to your appointment. Avoid significant exertion or exercise within 24 hours of your test. Be prepared to exercise and sweat. Your clothing should permit freedom of movement and include walking or running shoes. Women bring loose fitting short sleeved blouse.  This evaluation may be fatiguing and you may wish ti have someone accompany you to the assessment to drive you home afterward. Bring a list of your medications with you, including dosage and frequency you take the medications (  I.e.,once per day, twice per day, etc). Take all medications as prescribed, unless noted below or instructed to do so by your physician.  Please do not take the  following medications prior to your CPX:  _________________________________________________  _________________________________________________  Brief description of the test: A brief lung test will be performed. This will involve you taking deep breaths and blowing hard and fast through your mouth. During these , a clip will be on your nose and you will be breathing through a breathing device.   For the exercise portion of the test you will be walking on a treadmill, or riding a stationary bike, to your maximal effor or until symptoms such as chest pain, shortness of breath, leg pain or dizziness limit your exercise. You will be breathing in and out of a breathing device through your mouth (a clip will be on your nose again). Your heart rate, ECG, blood pressure, oxygen saturations, breathing rate and depth, amount of oxygen you consume and amount of carbon dioxide you produce will be measured and monitored throughout the exercise test.  If you need to cancel or reschedule your appointment please call [phone] If you have further questions please call your physician or Philip Aspen, MS, ACSM-RCEP at [phone]   Cardiac Sarcoidosis/Inflammation PET Scan Patient Instructions  Please report to Radiology at the [facility] Main Entrance 15 minutes early for your test.  [address] [place], [place] [address] BRING FOOD DIARY WITH YOU TO THIS APPOINTMENT For 24 hours before the test: Do not exercise! Do not eat after 5 pm the day before your test! To make sure that your scanning results are accurate, you MUST follow the sarcoid prep meal diet starting the day before  your PET scan. This diet involves eating no carbohydrates 24 hours before the test.  You will keep a log of all that you eat the day before your test. If you have questions or do not understand this diet, please call [phone] for more information. If you are unable to follow this diet, please discuss an  alternative strategy with the coordinator.  If you are diabetic, continue your diabetes medications as usual on the day before until you begin to fast. NO DIABETES MEDICATIONS ONCE YOU BEGIN TO FAST. What foods can I eat the day before my test?  Drink only water or black coffee (WITHOUT sugar, artificial sweetener, cream, or milk). Eggs (prepared without milk or cheese)  Meat that is either broiled or pan fried in butter WITHOUT breading (chicken, [place], bacon, meat-only sausage, hamburger, steak, fish) Butter, salt & pepper What foods must I AVOID the day before my test?  Do not consume alcoholic beverages, sodas, fruit juice, coffee creamer, or sports drinks  Do not eat vegetables, beans, nuts, fruits, juices, bread, grains, rice, pasta, potatoes, or any baked goods Do not eat dairy products (milk, cheese, etc.)  Do not eat mayonnaise, ketchup, tartar sauce, mustard, or other condiments Do not add sugar, artificial sweeteners, or Splenda (sucralose) to foods or drinks  Do not eat breaded foods (like fried chicken)  Do not eat sweets, candy, gum, sweetened cough drops, lozenges, or sugar  Do not eat sweetened, grilled, or cured meats or meat with carbohydrate-containing additives (some sausages, ham, sweetened bacon) Suggested items for breakfast, lunch, or dinner:  Breakfast  3 to 5 fatty sausage links fried in butter. 3 to 5 bacon strips.  3 eggs pan fried in butter (no milk or cheese).  Lunch/Dinner  2 hamburger patties fried in butter. Chicken or fatty fish pan fried in butter. No breading. 8 oz. fatty steak pan fried in butter.  Beverages  Drink only water or black coffee. DO NOT ADD SUGAR, ARTIFICIAL SWEETENER, CREAM, OR MILK   For more information and frequently asked questions, please visit our website : [email or web address]   Cardiac Sarcoidosis/Inflammation PET Scan  Food Diary Name: _____________________________ Please fill in EXACTLY what you have eaten and  when for 24 hours PRIOR to your test date.  Time Food/Drink Comments  Breakfast                Lunch                Dinner                Snacks                 DO NOT EXERCISE THE DAY BEFORE YOUR TEST DO NOT EAT AFTER 5 PM THE DAY BEFORE YOUR TEST.  ON THE DAY OF YOUR TEST, DO NOT EAT ANY FOOD AND ONLY DRINK CLEAR WATER! PLEASE BRING THIS FOOD DIARY WITH YOU TO YOUR APPOINTMENT   Your physician recommends that you schedule a follow-up appointment in: 4 weeks  If you have any questions or concerns before your next appointment please send [place] a message through [place] or call our office at [phone].    TO LEAVE A MESSAGE FOR THE NURSE SELECT OPTION 2, PLEASE LEAVE A MESSAGE INCLUDING: YOUR NAME DATE OF BIRTH CALL BACK NUMBER REASON FOR CALL**this is important as we prioritize the call backs  YOU WILL RECEIVE A CALL BACK THE SAME DAY AS LONG AS YOU CALL BEFORE 4:00 PM  At  the [facility], you and your health needs are our priority. As part of our continuing mission to provide you with exceptional heart care, we have created designated Provider Care Teams. These Care Teams include your primary Cardiologist (physician) and Advanced Practice Providers (APPs- Physician Assistants and Nurse Practitioners) who all work together to provide you with the care you need, when you need it.   You may see any of the following providers on your designated Care Team at your next follow up: Dr Arvilla Meres Dr Marca Ancona Dr. Marcos Eke, NP Robbie Lis, [place] [facility] [place], [place] Brynda Peon, NP Karle Plumber, PharmD   Please be sure to bring in all your medications bottles to every appointment.    Thank you for choosing [facility]

## 2022-07-26 NOTE — Progress Notes (Signed)
Advanced Heart Failure Clinic Note   Referring Physician: PCP: [name], PA-C PCP-Cardiologist: Norman Herrlich, MD  [facility]: Dr. 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    Admitted to [facility] 11/21 with CP. Found to have VT and EF 25-30% mild AI, MR and TR. Transferred to [facility]. Cath showed 70% prox LAD with no intervention.  He was discharged with Life Vest.    Presented to HF Clinic 02/03/19 with recurrent VT. Amio started. Referred to [facility] for PET. PET @ [facility] 2/22 showed EF 25% with active inflammation c/w sarcoid.   Re-admitted  02/2020 with recurrent VT and LifeVest alarms. + AKI. Started prednisone and PJP prophylaxis. ICD implanted. Amio switched due to mexilitene due to severe tremor.   S/p CRT-D upgrade 06/08/21.  He recent presented to [facility] with acute onset of L sided weakness. Was at home taking out his trash when he felt his legs all of a sudden give out. CT head was negative for acute abnormality. Received TPA at [facility]. CTA revealed an acute right M1 occlusion and he was emergently transferred to [facility] for a mechanical thrombectomy.  Post procedure developed hypotension. Given IVF w/ improvement of BP but developed subsequent volume overload. Echo showed LV markedly dilated EF < 20% and small effusion. RV ok. CVA felt to be cardioembolic. AHF team was consulted for HF management. Underwent RHC 6/28 which showed mean RA 9, PA 58/26, mean PCWP 30, CI 2.6. Diuresed w/ IV Lasix. Placed on GDMT, though limited some by soft BP. Anticoagulated w/ Eliquis. It was noted to consider VAD/transplant workup in near future. He was discharged home on 6/30.  He presents today for post hospital f/u. Here w/ daughter and grandson. Reports much improvement from dyspnea standpoint. Able to do basic ADLs w/o exertional dyspnea or fatigue. Says his legs still feel weak since his stroke and wants to enroll in an exercise program.  He reports full med compliance and BP has been low since d/c. The other day, he had systolic readings in the 70s-80s and noted fatigue. He notified this office and was instructed to stop losartan and switch spiro to at bedtime. BP improved but remains soft, 102/80 in clinic today. Denies LEE. No orthopnea/PND. Wt has been stable at home. Device interrogation shows fluid index trending back up but remains well below threshold. No VT/VF. BiVpaced 99%.   Wt Readings from Last 3 Encounters:  07/26/22 76.8 kg (169 lb 6.4 oz)  07/16/22 72.6 kg (160 lb 0.9 oz)  11/07/21 83.4 kg (183 lb 12.8 oz)      Review of Systems: [y] = yes, [ ]  = no   General: Weight gain [ ] ; Weight loss [ ] ; Anorexia [ ] ; Fatigue [ ] ; Fever [ ] ; Chills [ ] ; Weakness [ ]   Cardiac: Chest pain/pressure [ ] ; Resting SOB [ ] ; Exertional SOB [ ] ; Orthopnea [ ] ; Pedal Edema [ ] ; Palpitations [ ] ; Syncope [ ] ; Presyncope [ ] ; Paroxysmal nocturnal dyspnea[ ]   Pulmonary: Cough [ ] ; Wheezing[ ] ; Hemoptysis[ ] ; Sputum [ ] ; Snoring [ ]   GI: Vomiting[ ] ; Dysphagia[ ] ; Melena[ ] ; Hematochezia [ ] ; Heartburn[ ] ; Abdominal pain [ ] ; Constipation [ ] ; Diarrhea [ ] ; BRBPR [ ]   GU: Hematuria[ ] ; Dysuria [ ] ; Nocturia[ ]   Vascular: Pain in legs with walking [ ] ; Pain in feet with lying flat [ ] ; Non-healing sores [ ] ; Stroke [ ] ;  TIA [ ] ; Slurred speech [ ] ;  Neuro: Headaches[ ] ; Vertigo[ ] ; Seizures[ ] ; Paresthesias[ ] ;Blurred vision [ ] ; Diplopia [ ] ; Vision changes [ ]   Ortho/Skin: Arthritis [ ] ; Joint pain [ ] ; Muscle pain [ ] ; Joint swelling [ ] ; Back Pain [ ] ; Rash [ ]   Psych: Depression[ ] ; Anxiety[ ]   Heme: Bleeding problems [ ] ; Clotting disorders [ ] ; Anemia [ ]   Endocrine: Diabetes [ ] ; Thyroid dysfunction[ ]    Past Medical History:  Diagnosis Date   CAD (coronary artery disease)    CHF (congestive heart failure) (HCC)    GERD (gastroesophageal reflux disease)    Hyperlipidemia    Hypertension    Systolic heart failure (HCC)  2952   LVEF 18%, RVEF 38% on cardiac MRI 12/19/2019. possible cardiac sarcoidosis.   Wide-complex tachycardia 2021   wears LifeVest    Current Outpatient Medications  Medication Sig Dispense Refill   apixaban (ELIQUIS) 5 MG TABS tablet Take 1 tablet (5 mg total) by mouth 2 (two) times daily. 60 tablet 3   aspirin EC 81 MG EC tablet Take 1 tablet (81 mg total) by mouth daily. 90 tablet 3   atorvastatin (LIPITOR) 80 MG tablet TAKE 1 TABLET BY MOUTH EVERY DAY 90 tablet 5   carvedilol (COREG) 3.125 MG tablet TAKE 1 TABLET BY MOUTH TWICE A DAY 180 tablet 3   dapagliflozin propanediol (FARXIGA) 10 MG TABS tablet Take 1 tablet (10 mg total) by mouth daily before breakfast. (Patient not taking: Reported on 07/11/2022) 30 tablet 11   digoxin (LANOXIN) 0.125 MG tablet Take 1 tablet (0.125 mg total) by mouth daily. 30 tablet 1   dorzolamide-timolol (COSOPT) 22.3-6.8 MG/ML ophthalmic solution Place 1 drop into both eyes in the morning and at bedtime.     fluticasone-salmeterol (ADVAIR HFA) 230-21 MCG/ACT inhaler Inhale 2 puffs into the lungs 2 (two) times daily. 1 each 12   folic acid (FOLVITE) 1 MG tablet TAKE 1 TABLET BY MOUTH EVERY DAY 90 tablet 2   furosemide (LASIX) 40 MG tablet Take 1 tablet (40 mg total) by mouth daily. 30 tablet 1   latanoprost (XALATAN) 0.005 % ophthalmic solution Place 1 drop into both eyes at bedtime.     methotrexate (RHEUMATREX) 2.5 MG tablet Take 8 tablets (20 mg total) by mouth once a week. NEEDS FOLLOW UP APPOINTMENT FOR MORE REFILLS 96 tablet 0   mexiletine (MEXITIL) 250 MG capsule Take 1 capsule (250 mg total) by mouth 2 (two) times daily. 180 capsule 3   Multiple Vitamins-Minerals (MULTIVITAMIN WITH MINERALS) tablet Take 1 tablet by mouth daily. (Patient not taking: Reported on 11/07/2021)     pantoprazole (PROTONIX) 40 MG tablet Take 40 mg by mouth daily before breakfast.     spironolactone (ALDACTONE) 25 MG tablet TAKE 1/2 TABLET BY MOUTH DAILY 45 tablet 0   No  current facility-administered medications for this visit.    Allergies  Allergen Reactions   Percocet [Oxycodone-Acetaminophen] Itching      Social History   Socioeconomic History   Marital status: Divorced    Spouse name: Not on file   Number of children: Not on file   Years of education: Not on file   Highest education level: Not on file  Occupational History   Not on file  Tobacco Use   Smoking status: Former    Types: Cigarettes   Smokeless tobacco: Never  Vaping Use   Vaping Use: Unknown  Substance and Sexual Activity   Alcohol use: Yes  Alcohol/week: 6.0 standard drinks of alcohol    Types: 6 Cans of beer per week   Drug use: Yes    Types: Marijuana    Comment: stopped using months ago d/t how it affected breathing   Sexual activity: Yes  Other Topics Concern   Not on file  Social History Narrative   Not on file   Social Determinants of Health   Financial Resource Strain: Low Risk  (07/12/2022)   Overall Financial Resource Strain (CARDIA)    Difficulty of Paying Living Expenses: Not hard at all  Food Insecurity: No Food Insecurity (07/12/2022)   Hunger Vital Sign    Worried About Running Out of Food in the Last Year: Never true    Ran Out of Food in the Last Year: Never true  Transportation Needs: No Transportation Needs (07/12/2022)   PRAPARE - Administrator, Civil Service (Medical): No    Lack of Transportation (Non-Medical): No  Physical Activity: Not on file  Stress: Not on file  Social Connections: Not on file  Intimate Partner Violence: Not At Risk (07/13/2022)   Humiliation, Afraid, Rape, and Kick questionnaire    Fear of Current or Ex-Partner: No    Emotionally Abused: No    Physically Abused: No    Sexually Abused: No      Family History  Problem Relation Age of Onset   Hypertension Father    Heart disease Father    Heart disease Mother     There were no vitals filed for this visit.   PHYSICAL EXAM: General:  Well  appearing. No respiratory difficulty HEENT: normal Neck: supple. no JVD. Carotids 2+ bilat; no bruits. No lymphadenopathy or thyromegaly appreciated. Cor: PMI nondisplaced. Regular rate & rhythm. No rubs, gallops or murmurs. Lungs: clear Abdomen: soft, nontender, nondistended. No hepatosplenomegaly. No bruits or masses. Good bowel sounds. Extremities: no cyanosis, clubbing, rash, edema Neuro: alert & oriented x 3, cranial nerves grossly intact. moves all 4 extremities w/o difficulty. Affect pleasant.  ECG: Atrial sensed- v paced    ASSESSMENT & PLAN:   1.  Chronic Systolic HF - Diagnosed 11/2019. Presented with VT. LHC 70% LAD  - Echo EF 25-30%  - cMRI 12/21 concerning for sarcoid and EF 18%.  - PET 2/22 at [facility] EF 25% + active sarcoid - Echo 08/26/20 EF < 20% severely dilated LV RV mildly decreased.  - CPX 8/22. Unable to complete PFTs due to hypoventilation and poor effort. pVO2 16.1 (49% predicted). Slope 19 RER: 1.17 PETCO2 59 - CRT-D upgrade in 06/08/21 - Echo 11/07/21: EF 20-25% RV mild to moderately reduced.  - Echo 07/10/22: EF <20%, RV okay, mod pericardial effusion, mod MR/TR - RHC 6/28 showed mean RA 9, PA 58/26, mean PCWP 30, CI 2.6>>diuresed w/ IV Lasix  - NYHA class II. Euvolemic on exam. Device interrogation shows fluid index trending back up but remains well below threshold. No VT/VF. BiVpaced 99%.  - GDMT limited by soft BP - off losartan due to low BP  - continue spiro 12.5 mg at bedtime  - continue Farxiga 10 mg daily  - continue digoxin 0.125 mcg daily. Check Digoxin level  - continue PO Lasix 40 mg daily  - continue Coreg 3.125 mg bid   - Suspect will need to consider VAD/transplant workup in near future. Previously considered for advanced HF therapies but lung function was limiting based on CPX results 2022. F/u PFT were slightly better. D/w Dr. Gala Romney today, will arrange for repeat  CPX in September.  - re-enroll in CR   2.  Recent Stroke -Admitted with  acute L sided weakness 6/24, CTA revealed an acute right M1 occlusion. S/p TPA and mechanical clot extraction. No residual deficits  -Likely cardioembolic in setting of severe LV dysfunction. 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 EKG today A sensed- v paced w/ underlying rhythm sinus.  - now on Eliquis. Plan to continue indefinitely for secondary prevention  - re-enrol in CR   3. 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 Continue mexilitene  - now s/p ICD. No VT on device interrogation today     4. CAD - LHC 12/07/19 70-% LAD, no intervention - No s/s ischemia, denies CP - Continue statin and aspirin.  - recent LDL acceptable at 59   5. Cardiac sarcoid - PET 2/22 at [facility] EF 25% + active sarcoid - Has completed prednisone. Now on MTX 20 weekly. Continue folic acid. - We discussed EM biopsy but patient is reluctant  - Needs repeat PET, wasn't able to schedule 10/23 as insurance would not cover. Now has medicaid and medicare. 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 Iron deficiency anemia - recent T sat 13% - Would benefit from feraheme (did not get while inpatient). Will arrange outpatient infusion    F/u in 3-4 wks w/ APP    Robbie Lis, PA-C 07/26/22

## 2022-07-27 ENCOUNTER — Telehealth (HOSPITAL_COMMUNITY): Payer: Self-pay

## 2022-07-27 DIAGNOSIS — Z8673 Personal history of transient ischemic attack (TIA), and cerebral infarction without residual deficits: Secondary | ICD-10-CM | POA: Diagnosis not present

## 2022-07-27 DIAGNOSIS — D8685 Sarcoid myocarditis: Secondary | ICD-10-CM | POA: Diagnosis not present

## 2022-07-27 DIAGNOSIS — D649 Anemia, unspecified: Secondary | ICD-10-CM | POA: Diagnosis not present

## 2022-07-27 DIAGNOSIS — I639 Cerebral infarction, unspecified: Secondary | ICD-10-CM | POA: Diagnosis not present

## 2022-07-27 DIAGNOSIS — I5022 Chronic systolic (congestive) heart failure: Secondary | ICD-10-CM | POA: Diagnosis not present

## 2022-07-27 DIAGNOSIS — K219 Gastro-esophageal reflux disease without esophagitis: Secondary | ICD-10-CM | POA: Diagnosis not present

## 2022-07-27 DIAGNOSIS — I472 Ventricular tachycardia, unspecified: Secondary | ICD-10-CM | POA: Diagnosis not present

## 2022-07-27 DIAGNOSIS — J449 Chronic obstructive pulmonary disease, unspecified: Secondary | ICD-10-CM | POA: Diagnosis not present

## 2022-07-27 MED ORDER — FUROSEMIDE 40 MG PO TABS: 40.0000 mg | ORAL_TABLET | Freq: Two times a day (BID) | ORAL | 3 refills | Status: DC

## 2022-07-27 NOTE — Telephone Encounter (Addendum)
Pt aware, agreeable, and verbalized understanding  Med list updated and follow up arranged   ----- Message from [facility] sent at 07/26/2022  5:14 PM EDT ----- BNP (fluid marker) is elevated and above recent hospital value, suggesting fluid accumulation. This correlates to his device report that also showed fluid accumulation. Increase Lasix to 40 mg bid.   Digoxin elevated. Stop digoxin   Needs f/u visit w/ APP in 7-10 days and repeat labs.

## 2022-08-08 NOTE — Progress Notes (Signed)
Advanced Heart Failure Clinic Note   Referring Physician: PCP: [name], PA-C PCP-Cardiologist: Norman Herrlich, MD  [facility]: Dr. 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    Admitted to [facility] 11/21 with CP. Found to have VT and EF 25-30% mild AI, Charles and TR. Transferred to [facility]. Cath showed 70% prox LAD with no intervention.  He was discharged with Life Vest.    Presented to HF Clinic 02/03/19 with recurrent VT. Amio started. Referred to [facility] for PET. PET @ [facility] 2/22 showed EF 25% with active inflammation c/w sarcoid.   Re-admitted  02/2020 with recurrent VT and LifeVest alarms. + AKI. Started prednisone and PJP prophylaxis. ICD implanted. Amio switched due to mexilitene due to severe tremor.   S/P CRT-D upgrade 06/08/21.  He recent presented to [facility] with acute onset of L sided weakness. Was at home taking out his trash when he felt his legs all of a sudden give out. CT head was negative for acute abnormality. Received TPA at [facility]. CTA revealed an acute right M1 occlusion and he was emergently transferred to [facility] for a mechanical thrombectomy.  Post procedure developed hypotension. Given IVF w/ improvement of BP but developed subsequent volume overload. Echo showed LV markedly dilated EF < 20% and small effusion. RV ok. CVA felt to be cardioembolic. AHF team was consulted for HF management. Underwent RHC 6/28 which showed mean RA 9, PA 58/26, mean PCWP 30, CI 2.6. Diuresed w/ IV Lasix. Placed on GDMT, though limited some by soft BP. Anticoagulated w/ Eliquis. VAD/transplant workup in near future. He was discharged home on 07/16/22.  HF visit on 07/26/22.  Volume mildly elevated. He was set up for CPX  in September.  Today he returns for HF follow up with his daughter and grandson. Overall feeling terrible. Progressively worse with fatigue and shortness of breath. SOB performing ADLs. Denies PND/Orthopnea. Appetite  poor. No fever or chills. Weight at home 166-169   pounds. Taking all medications.   Optivol: Activity < 0.5 per day, Impedance down, Fluid index trending up. NO VT. No AF. 99% BiV pacing.     Past Medical History:  Diagnosis Date   CAD (coronary artery disease)    CHF (congestive heart failure) (HCC)    GERD (gastroesophageal reflux disease)    Hyperlipidemia    Hypertension    Systolic heart failure (HCC) 2021   LVEF 18%, RVEF 38% on cardiac MRI 12/19/2019. possible cardiac sarcoidosis.   Wide-complex tachycardia 2021   wears LifeVest    Current Outpatient Medications  Medication Sig Dispense Refill   apixaban (ELIQUIS) 5 MG TABS tablet Take 1 tablet (5 mg total) by mouth 2 (two) times daily. 180 tablet 3   atorvastatin (LIPITOR) 80 MG tablet Take 1 tablet (80 mg total) by mouth daily. 90 tablet 3   carvedilol (COREG) 3.125 MG tablet Take 1 tablet (3.125 mg total) by mouth 2 (two) times daily. 180 tablet 3   dapagliflozin propanediol (FARXIGA) 10 MG TABS tablet Take 1 tablet (10 mg total) by mouth daily before breakfast. 90 tablet 3   dorzolamide-timolol (COSOPT) 22.3-6.8 MG/ML ophthalmic solution Place 1 drop into both eyes in the morning and at bedtime.     fluticasone-salmeterol (ADVAIR HFA) 230-21 MCG/ACT inhaler Inhale 2 puffs into the lungs 2 (two) times daily. 1 each 12   folic acid (FOLVITE) 1 MG tablet Take 1 tablet (1 mg  total) by mouth daily. 90 tablet 3   furosemide (LASIX) 40 MG tablet Take 1 tablet (40 mg total) by mouth 2 (two) times daily. 90 tablet 3   latanoprost (XALATAN) 0.005 % ophthalmic solution Place 1 drop into both eyes at bedtime.     methotrexate (RHEUMATREX) 2.5 MG tablet Take 8 tablets (20 mg total) by mouth once a week. 96 tablet 3   mexiletine (MEXITIL) 250 MG capsule Take 1 capsule (250 mg total) by mouth 2 (two) times daily. 180 capsule 3   pantoprazole (PROTONIX) 40 MG tablet Take 40 mg by mouth daily before breakfast.     spironolactone  (ALDACTONE) 25 MG tablet TAKE 1/2 TABLET BY MOUTH DAILY (Patient taking differently: Take 12.5 mg by mouth at bedtime.) 45 tablet 0   No current facility-administered medications for this encounter.    Allergies  Allergen Reactions   Percocet [Oxycodone-Acetaminophen] Itching      Social History   Socioeconomic History   Marital status: Divorced    Spouse name: Not on file   Number of children: Not on file   Years of education: Not on file   Highest education level: Not on file  Occupational History   Not on file  Tobacco Use   Smoking status: Former    Types: Cigarettes   Smokeless tobacco: Never  Vaping Use   Vaping status: Unknown  Substance and Sexual Activity   Alcohol use: Yes    Alcohol/week: 6.0 standard drinks of alcohol    Types: 6 Cans of beer per week   Drug use: Yes    Types: Marijuana    Comment: stopped using months ago d/t how it affected breathing   Sexual activity: Yes  Other Topics Concern   Not on file  Social History Narrative   Not on file   Social Determinants of Health   Financial Resource Strain: Low Risk  (07/12/2022)   Overall Financial Resource Strain (CARDIA)    Difficulty of Paying Living Expenses: Not hard at all  Food Insecurity: No Food Insecurity (07/12/2022)   Hunger Vital Sign    Worried About Running Out of Food in the Last Year: Never true    Ran Out of Food in the Last Year: Never true  Transportation Needs: No Transportation Needs (07/12/2022)   PRAPARE - Administrator, Civil Service (Medical): No    Lack of Transportation (Non-Medical): No  Physical Activity: Not on file  Stress: Not on file  Social Connections: Not on file  Intimate Partner Violence: Not At Risk (07/13/2022)   Humiliation, Afraid, Rape, and Kick questionnaire    Fear of Current or Ex-Partner: No    Emotionally Abused: No    Physically Abused: No    Sexually Abused: No      Family History  Problem Relation Age of Onset   Hypertension  Father    Heart disease Father    Heart disease Mother     Vitals:   08/10/22 1106  BP: 120/88  Pulse: 86  SpO2: 97%  Weight: 77.5 kg (170 lb 12.8 oz)    Wt Readings from Last 3 Encounters:  08/10/22 77.5 kg (170 lb 12.8 oz)  07/26/22 76.8 kg (169 lb 6.4 oz)  07/16/22 72.6 kg (160 lb 0.9 oz)    PHYSICAL EXAM: General:  Appears weak. No resp difficulty HEENT: normal Neck: supple. JVP to jaw . Carotids 2+ bilat; no bruits. No lymphadenopathy or thryomegaly appreciated. Cor: PMI nondisplaced. Regular rate & rhythm.  No rubs, gallops or murmurs. Lungs: clear Abdomen: soft, nontender, nondistended. No hepatosplenomegaly. No bruits or masses. Good bowel sounds. Extremities: no cyanosis, clubbing, rash, R and LLE 2+ edema. Feet blue Neuro: alert & orientedx3, cranial nerves grossly intact. moves all 4 extremities w/o difficulty. Affect pleasant    ASSESSMENT & PLAN:   1.  Acute/Chronic Systolic HF - Diagnosed 11/2019. Presented with VT. LHC 70% LAD  -- cMRI 12/21 concerning for sarcoid and EF 18%.  - PET 2/22 at [facility] EF 25% + active sarcoid - Echo 08/26/20 EF < 20% severely dilated LV RV mildly decreased.  - CPX 8/22. Unable to complete PFTs due to hypoventilation and poor effort. pVO2 16.1 (49% predicted). Slope 19 RER: 1.17 PETCO2 59 - [employer] CRT-D upgrade in 06/08/21 - Echo 11/07/21: EF 20-25% RV mild to moderately reduced.  - Echo 07/10/22: EF <20%, RV okay, mod pericardial effusion, mod Charles/TR - RHC 6/28 showed mean RA 9, PA 58/26, mean PCWP 30, CI 2.6>>diuresed w/ IV Lasix  Optivol - Fluid index trending up and thoracic impedance down. Activity trending down to 0.5 hours.  - NYHA IIIC. On exam he is volume overloaded and has had functional decline over the 2 weeks. Concerned this may be low output.  - Will need to admit attempt to decongest with IV lasix. Place PICC to check CO-OX. IF CO-OX low will need inotropes.  - Hold bb.  - No ARB for now.  - continue spiro 12.5 mg  at bedtime  - continue Farxiga 10 mg daily  - continue digoxin 0.125 mcg daily.  -Will ask VAD Coordinators to consult. Previously considered for advanced HF therapies but lung function was limiting based on CPX results 2022. F/u PFT were slightly better.  2.  Stroke -Admitted with acute L sided weakness 6/24, CTA revealed an acute right M1 occlusion. S/p TPA and mechanical clot extraction. No residual deficits  -Likely cardioembolic in setting of severe LV dysfunction. 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 EKG today A sensed- v paced w/ underlying rhythm sinus.  - Would stop eliquis and place on Heparin drip until all procedures completed.   3. 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 Continue mexilitene  - now s/p ICD. No VT on interrogation.   4. CAD - LHC 12/07/19 70-% LAD, no intervention - No chest pain.  - Continue statin and aspirin.    5. Cardiac sarcoid - PET 2/22 at [facility] EF 25% + active sarcoid - Has completed prednisone. Now on MTX 20 weekly. Continue folic acid. - We discussed EM biopsy but patient is reluctant  - Needs repeat PET, wasn't able to schedule 10/23 as insurance would not cover. Now has medicaid and medicare. 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  - on Eliquis for recent CVA    7. Iron deficiency anemia - recent T sat 15%     Admit for further work up. Volume overloaded with possible low output heart failure. Place PICC and check CO-OX. May need to add inotropes. Would start lasix 80 mg IV twice a day. Hold carvedilol for now with suspected low ouput.   Would hold eliquis and start heparin drip until procedures completed. Will notify VAD Coordinators.    Tonye Becket, NP 08/10/22

## 2022-08-10 ENCOUNTER — Encounter (HOSPITAL_COMMUNITY): Payer: Self-pay

## 2022-08-10 ENCOUNTER — Emergency Department (HOSPITAL_COMMUNITY): Payer: Medicare Other

## 2022-08-10 ENCOUNTER — Other Ambulatory Visit: Payer: Self-pay

## 2022-08-10 ENCOUNTER — Inpatient Hospital Stay (HOSPITAL_COMMUNITY)
Admission: EM | Admit: 2022-08-10 | Discharge: 2022-09-27 | DRG: 001 | Disposition: A | Discharge status: Rehabilitation Facility | Payer: Medicare Other | Attending: Internal Medicine | Admitting: Internal Medicine

## 2022-08-10 ENCOUNTER — Inpatient Hospital Stay (HOSPITAL_COMMUNITY): Payer: Medicare Other

## 2022-08-10 ENCOUNTER — Inpatient Hospital Stay (HOSPITAL_COMMUNITY)
Admission: EM | Disposition: A | Discharge status: Rehabilitation Facility | Payer: Self-pay | Source: Home / Self Care | Attending: Internal Medicine

## 2022-08-10 ENCOUNTER — Ambulatory Visit (HOSPITAL_BASED_OUTPATIENT_CLINIC_OR_DEPARTMENT_OTHER)
Admission: RE | Admit: 2022-08-10 | Discharge: 2022-08-10 | Disposition: A | Discharge status: Home / Self Care | Payer: Medicare Other | Source: Ambulatory Visit | Attending: Adult Health | Admitting: Adult Health

## 2022-08-10 VITALS — BP 120/88 | HR 86 | Wt 170.8 lb

## 2022-08-10 DIAGNOSIS — I5022 Chronic systolic (congestive) heart failure: Secondary | ICD-10-CM | POA: Insufficient documentation

## 2022-08-10 DIAGNOSIS — I6381 Other cerebral infarction due to occlusion or stenosis of small artery: Secondary | ICD-10-CM | POA: Diagnosis not present

## 2022-08-10 DIAGNOSIS — I255 Ischemic cardiomyopathy: Secondary | ICD-10-CM | POA: Diagnosis present

## 2022-08-10 DIAGNOSIS — G4700 Insomnia, unspecified: Secondary | ICD-10-CM | POA: Diagnosis not present

## 2022-08-10 DIAGNOSIS — Z7982 Long term (current) use of aspirin: Secondary | ICD-10-CM

## 2022-08-10 DIAGNOSIS — I509 Heart failure, unspecified: Secondary | ICD-10-CM | POA: Diagnosis not present

## 2022-08-10 DIAGNOSIS — R0602 Shortness of breath: Secondary | ICD-10-CM | POA: Diagnosis not present

## 2022-08-10 DIAGNOSIS — R5381 Other malaise: Secondary | ICD-10-CM | POA: Insufficient documentation

## 2022-08-10 DIAGNOSIS — D869 Sarcoidosis, unspecified: Secondary | ICD-10-CM | POA: Insufficient documentation

## 2022-08-10 DIAGNOSIS — I63511 Cerebral infarction due to unspecified occlusion or stenosis of right middle cerebral artery: Secondary | ICD-10-CM | POA: Diagnosis not present

## 2022-08-10 DIAGNOSIS — I472 Ventricular tachycardia, unspecified: Secondary | ICD-10-CM | POA: Diagnosis present

## 2022-08-10 DIAGNOSIS — Z452 Encounter for adjustment and management of vascular access device: Secondary | ICD-10-CM | POA: Diagnosis not present

## 2022-08-10 DIAGNOSIS — R1312 Dysphagia, oropharyngeal phase: Secondary | ICD-10-CM | POA: Diagnosis present

## 2022-08-10 DIAGNOSIS — R0989 Other specified symptoms and signs involving the circulatory and respiratory systems: Secondary | ICD-10-CM | POA: Diagnosis not present

## 2022-08-10 DIAGNOSIS — I639 Cerebral infarction, unspecified: Secondary | ICD-10-CM | POA: Diagnosis not present

## 2022-08-10 DIAGNOSIS — I615 Nontraumatic intracerebral hemorrhage, intraventricular: Secondary | ICD-10-CM | POA: Diagnosis not present

## 2022-08-10 DIAGNOSIS — Z8249 Family history of ischemic heart disease and other diseases of the circulatory system: Secondary | ICD-10-CM | POA: Insufficient documentation

## 2022-08-10 DIAGNOSIS — I63411 Cerebral infarction due to embolism of right middle cerebral artery: Secondary | ICD-10-CM | POA: Diagnosis not present

## 2022-08-10 DIAGNOSIS — N3289 Other specified disorders of bladder: Secondary | ICD-10-CM | POA: Diagnosis not present

## 2022-08-10 DIAGNOSIS — Z515 Encounter for palliative care: Secondary | ICD-10-CM

## 2022-08-10 DIAGNOSIS — Z885 Allergy status to narcotic agent status: Secondary | ICD-10-CM

## 2022-08-10 DIAGNOSIS — K0850 Unsatisfactory restoration of tooth, unspecified: Secondary | ICD-10-CM | POA: Diagnosis not present

## 2022-08-10 DIAGNOSIS — I517 Cardiomegaly: Secondary | ICD-10-CM | POA: Diagnosis not present

## 2022-08-10 DIAGNOSIS — I2729 Other secondary pulmonary hypertension: Secondary | ICD-10-CM | POA: Diagnosis present

## 2022-08-10 DIAGNOSIS — R443 Hallucinations, unspecified: Secondary | ICD-10-CM | POA: Diagnosis not present

## 2022-08-10 DIAGNOSIS — K219 Gastro-esophageal reflux disease without esophagitis: Secondary | ICD-10-CM | POA: Insufficient documentation

## 2022-08-10 DIAGNOSIS — N35919 Unspecified urethral stricture, male, unspecified site: Secondary | ICD-10-CM | POA: Diagnosis present

## 2022-08-10 DIAGNOSIS — I501 Left ventricular failure: Secondary | ICD-10-CM | POA: Diagnosis not present

## 2022-08-10 DIAGNOSIS — I2510 Atherosclerotic heart disease of native coronary artery without angina pectoris: Secondary | ICD-10-CM | POA: Diagnosis present

## 2022-08-10 DIAGNOSIS — J90 Pleural effusion, not elsewhere classified: Secondary | ICD-10-CM | POA: Diagnosis not present

## 2022-08-10 DIAGNOSIS — Z7951 Long term (current) use of inhaled steroids: Secondary | ICD-10-CM

## 2022-08-10 DIAGNOSIS — R570 Cardiogenic shock: Secondary | ICD-10-CM | POA: Diagnosis present

## 2022-08-10 DIAGNOSIS — I672 Cerebral atherosclerosis: Secondary | ICD-10-CM | POA: Diagnosis not present

## 2022-08-10 DIAGNOSIS — Z95810 Presence of automatic (implantable) cardiac defibrillator: Secondary | ICD-10-CM

## 2022-08-10 DIAGNOSIS — I6601 Occlusion and stenosis of right middle cerebral artery: Secondary | ICD-10-CM | POA: Diagnosis not present

## 2022-08-10 DIAGNOSIS — D8685 Sarcoid myocarditis: Secondary | ICD-10-CM | POA: Diagnosis present

## 2022-08-10 DIAGNOSIS — I5023 Acute on chronic systolic (congestive) heart failure: Secondary | ICD-10-CM

## 2022-08-10 DIAGNOSIS — G8194 Hemiplegia, unspecified affecting left nondominant side: Secondary | ICD-10-CM | POA: Diagnosis not present

## 2022-08-10 DIAGNOSIS — R0609 Other forms of dyspnea: Secondary | ICD-10-CM | POA: Diagnosis not present

## 2022-08-10 DIAGNOSIS — I428 Other cardiomyopathies: Secondary | ICD-10-CM | POA: Diagnosis not present

## 2022-08-10 DIAGNOSIS — I480 Paroxysmal atrial fibrillation: Secondary | ICD-10-CM | POA: Diagnosis not present

## 2022-08-10 DIAGNOSIS — Z434 Encounter for attention to other artificial openings of digestive tract: Secondary | ICD-10-CM | POA: Diagnosis not present

## 2022-08-10 DIAGNOSIS — J9811 Atelectasis: Secondary | ICD-10-CM | POA: Diagnosis not present

## 2022-08-10 DIAGNOSIS — Z7189 Other specified counseling: Secondary | ICD-10-CM | POA: Diagnosis not present

## 2022-08-10 DIAGNOSIS — J449 Chronic obstructive pulmonary disease, unspecified: Secondary | ICD-10-CM

## 2022-08-10 DIAGNOSIS — Z79899 Other long term (current) drug therapy: Secondary | ICD-10-CM | POA: Insufficient documentation

## 2022-08-10 DIAGNOSIS — I959 Hypotension, unspecified: Secondary | ICD-10-CM | POA: Diagnosis not present

## 2022-08-10 DIAGNOSIS — R531 Weakness: Secondary | ICD-10-CM | POA: Insufficient documentation

## 2022-08-10 DIAGNOSIS — E876 Hypokalemia: Secondary | ICD-10-CM | POA: Diagnosis present

## 2022-08-10 DIAGNOSIS — Z87891 Personal history of nicotine dependence: Secondary | ICD-10-CM

## 2022-08-10 DIAGNOSIS — I3139 Other pericardial effusion (noninflammatory): Secondary | ICD-10-CM | POA: Insufficient documentation

## 2022-08-10 DIAGNOSIS — I5084 End stage heart failure: Secondary | ICD-10-CM | POA: Diagnosis not present

## 2022-08-10 DIAGNOSIS — I952 Hypotension due to drugs: Secondary | ICD-10-CM | POA: Diagnosis not present

## 2022-08-10 DIAGNOSIS — R509 Fever, unspecified: Secondary | ICD-10-CM | POA: Diagnosis not present

## 2022-08-10 DIAGNOSIS — G4733 Obstructive sleep apnea (adult) (pediatric): Secondary | ICD-10-CM | POA: Diagnosis not present

## 2022-08-10 DIAGNOSIS — F4321 Adjustment disorder with depressed mood: Secondary | ICD-10-CM | POA: Diagnosis present

## 2022-08-10 DIAGNOSIS — R1310 Dysphagia, unspecified: Secondary | ICD-10-CM | POA: Diagnosis not present

## 2022-08-10 DIAGNOSIS — R0689 Other abnormalities of breathing: Secondary | ICD-10-CM | POA: Diagnosis not present

## 2022-08-10 DIAGNOSIS — N179 Acute kidney failure, unspecified: Secondary | ICD-10-CM | POA: Diagnosis present

## 2022-08-10 DIAGNOSIS — B952 Enterococcus as the cause of diseases classified elsewhere: Secondary | ICD-10-CM | POA: Insufficient documentation

## 2022-08-10 DIAGNOSIS — R34 Anuria and oliguria: Secondary | ICD-10-CM | POA: Diagnosis not present

## 2022-08-10 DIAGNOSIS — Z7901 Long term (current) use of anticoagulants: Secondary | ICD-10-CM

## 2022-08-10 DIAGNOSIS — I340 Nonrheumatic mitral (valve) insufficiency: Secondary | ICD-10-CM | POA: Diagnosis present

## 2022-08-10 DIAGNOSIS — I130 Hypertensive heart and chronic kidney disease with heart failure and stage 1 through stage 4 chronic kidney disease, or unspecified chronic kidney disease: Principal | ICD-10-CM | POA: Diagnosis present

## 2022-08-10 DIAGNOSIS — I69354 Hemiplegia and hemiparesis following cerebral infarction affecting left non-dominant side: Secondary | ICD-10-CM | POA: Diagnosis not present

## 2022-08-10 DIAGNOSIS — I25118 Atherosclerotic heart disease of native coronary artery with other forms of angina pectoris: Secondary | ICD-10-CM | POA: Diagnosis not present

## 2022-08-10 DIAGNOSIS — D509 Iron deficiency anemia, unspecified: Secondary | ICD-10-CM

## 2022-08-10 DIAGNOSIS — I63521 Cerebral infarction due to unspecified occlusion or stenosis of right anterior cerebral artery: Secondary | ICD-10-CM | POA: Diagnosis not present

## 2022-08-10 DIAGNOSIS — K029 Dental caries, unspecified: Secondary | ICD-10-CM | POA: Diagnosis not present

## 2022-08-10 DIAGNOSIS — L22 Diaper dermatitis: Secondary | ICD-10-CM | POA: Diagnosis not present

## 2022-08-10 DIAGNOSIS — R471 Dysarthria and anarthria: Secondary | ICD-10-CM | POA: Diagnosis present

## 2022-08-10 DIAGNOSIS — Z01810 Encounter for preprocedural cardiovascular examination: Secondary | ICD-10-CM | POA: Diagnosis not present

## 2022-08-10 DIAGNOSIS — Z91199 Patient's noncompliance with other medical treatment and regimen due to unspecified reason: Secondary | ICD-10-CM

## 2022-08-10 DIAGNOSIS — Z01818 Encounter for other preprocedural examination: Secondary | ICD-10-CM | POA: Diagnosis not present

## 2022-08-10 DIAGNOSIS — E785 Hyperlipidemia, unspecified: Secondary | ICD-10-CM | POA: Diagnosis present

## 2022-08-10 DIAGNOSIS — R29705 NIHSS score 5: Secondary | ICD-10-CM | POA: Diagnosis not present

## 2022-08-10 DIAGNOSIS — R7881 Bacteremia: Secondary | ICD-10-CM | POA: Diagnosis not present

## 2022-08-10 DIAGNOSIS — E119 Type 2 diabetes mellitus without complications: Secondary | ICD-10-CM | POA: Diagnosis not present

## 2022-08-10 DIAGNOSIS — I10 Essential (primary) hypertension: Secondary | ICD-10-CM | POA: Diagnosis present

## 2022-08-10 DIAGNOSIS — E1122 Type 2 diabetes mellitus with diabetic chronic kidney disease: Secondary | ICD-10-CM | POA: Diagnosis present

## 2022-08-10 DIAGNOSIS — R29810 Facial weakness: Secondary | ICD-10-CM | POA: Diagnosis not present

## 2022-08-10 DIAGNOSIS — I69322 Dysarthria following cerebral infarction: Secondary | ICD-10-CM | POA: Diagnosis not present

## 2022-08-10 DIAGNOSIS — B372 Candidiasis of skin and nail: Secondary | ICD-10-CM | POA: Diagnosis not present

## 2022-08-10 DIAGNOSIS — K5900 Constipation, unspecified: Secondary | ICD-10-CM | POA: Diagnosis not present

## 2022-08-10 DIAGNOSIS — I97821 Postprocedural cerebrovascular infarction during other surgery: Secondary | ICD-10-CM | POA: Diagnosis not present

## 2022-08-10 DIAGNOSIS — Z95811 Presence of heart assist device: Secondary | ICD-10-CM | POA: Diagnosis not present

## 2022-08-10 DIAGNOSIS — R918 Other nonspecific abnormal finding of lung field: Secondary | ICD-10-CM | POA: Diagnosis not present

## 2022-08-10 DIAGNOSIS — F05 Delirium due to known physiological condition: Secondary | ICD-10-CM | POA: Diagnosis not present

## 2022-08-10 DIAGNOSIS — Z23 Encounter for immunization: Secondary | ICD-10-CM | POA: Diagnosis not present

## 2022-08-10 DIAGNOSIS — R1011 Right upper quadrant pain: Secondary | ICD-10-CM | POA: Diagnosis not present

## 2022-08-10 DIAGNOSIS — H409 Unspecified glaucoma: Secondary | ICD-10-CM | POA: Diagnosis not present

## 2022-08-10 DIAGNOSIS — G936 Cerebral edema: Secondary | ICD-10-CM | POA: Diagnosis not present

## 2022-08-10 DIAGNOSIS — Z950 Presence of cardiac pacemaker: Secondary | ICD-10-CM | POA: Diagnosis not present

## 2022-08-10 DIAGNOSIS — Z4682 Encounter for fitting and adjustment of non-vascular catheter: Secondary | ICD-10-CM | POA: Diagnosis not present

## 2022-08-10 DIAGNOSIS — I5021 Acute systolic (congestive) heart failure: Secondary | ICD-10-CM | POA: Diagnosis present

## 2022-08-10 DIAGNOSIS — D62 Acute posthemorrhagic anemia: Secondary | ICD-10-CM | POA: Diagnosis not present

## 2022-08-10 DIAGNOSIS — I82712 Chronic embolism and thrombosis of superficial veins of left upper extremity: Secondary | ICD-10-CM | POA: Diagnosis not present

## 2022-08-10 DIAGNOSIS — Z8673 Personal history of transient ischemic attack (TIA), and cerebral infarction without residual deficits: Secondary | ICD-10-CM | POA: Diagnosis not present

## 2022-08-10 DIAGNOSIS — I69391 Dysphagia following cerebral infarction: Secondary | ICD-10-CM | POA: Diagnosis not present

## 2022-08-10 DIAGNOSIS — S2241XA Multiple fractures of ribs, right side, initial encounter for closed fracture: Secondary | ICD-10-CM | POA: Diagnosis not present

## 2022-08-10 DIAGNOSIS — I110 Hypertensive heart disease with heart failure: Secondary | ICD-10-CM | POA: Insufficient documentation

## 2022-08-10 DIAGNOSIS — I514 Myocarditis, unspecified: Secondary | ICD-10-CM | POA: Diagnosis not present

## 2022-08-10 DIAGNOSIS — R54 Age-related physical debility: Secondary | ICD-10-CM | POA: Diagnosis present

## 2022-08-10 DIAGNOSIS — Z888 Allergy status to other drugs, medicaments and biological substances status: Secondary | ICD-10-CM

## 2022-08-10 DIAGNOSIS — G479 Sleep disorder, unspecified: Secondary | ICD-10-CM | POA: Diagnosis not present

## 2022-08-10 DIAGNOSIS — I6602 Occlusion and stenosis of left middle cerebral artery: Secondary | ICD-10-CM | POA: Diagnosis not present

## 2022-08-10 HISTORY — PX: RIGHT/LEFT HEART CATH AND CORONARY ANGIOGRAPHY: CATH118266

## 2022-08-10 LAB — CBC
HCT: 38 % — ABNORMAL LOW (ref 39.0–52.0)
Hemoglobin: 12.3 g/dL — ABNORMAL LOW (ref 13.0–17.0)
MCH: 33.3 pg (ref 26.0–34.0)
MCHC: 32.4 g/dL (ref 30.0–36.0)
MCV: 103 fL — ABNORMAL HIGH (ref 80.0–100.0)
Platelets: 217 10*3/uL (ref 150–400)
RBC: 3.69 MIL/uL — ABNORMAL LOW (ref 4.22–5.81)
RDW: 16.6 % — ABNORMAL HIGH (ref 11.5–15.5)
WBC: 10.2 10*3/uL (ref 4.0–10.5)
nRBC: 1.1 % — ABNORMAL HIGH (ref 0.0–0.2)

## 2022-08-10 LAB — COMPREHENSIVE METABOLIC PANEL
ALT: 77 U/L — ABNORMAL HIGH (ref 0–44)
AST: 61 U/L — ABNORMAL HIGH (ref 15–41)
Albumin: 3.8 g/dL (ref 3.5–5.0)
Alkaline Phosphatase: 77 U/L (ref 38–126)
Anion gap: 12 (ref 5–15)
BUN: 29 mg/dL — ABNORMAL HIGH (ref 6–20)
CO2: 33 mmol/L — ABNORMAL HIGH (ref 22–32)
Calcium: 9.3 mg/dL (ref 8.9–10.3)
Chloride: 91 mmol/L — ABNORMAL LOW (ref 98–111)
Creatinine, Ser: 1.56 mg/dL — ABNORMAL HIGH (ref 0.61–1.24)
GFR, Estimated: 51 mL/min — ABNORMAL LOW (ref >60)
Glucose, Bld: 110 mg/dL — ABNORMAL HIGH (ref 70–99)
Potassium: 3.3 mmol/L — ABNORMAL LOW (ref 3.5–5.1)
Sodium: 136 mmol/L (ref 135–145)
Total Bilirubin: 3.4 mg/dL — ABNORMAL HIGH (ref 0.3–1.2)
Total Protein: 6.9 g/dL (ref 6.5–8.1)

## 2022-08-10 LAB — POCT I-STAT EG7
Acid-Base Excess: 5 mmol/L — ABNORMAL HIGH (ref 0.0–2.0)
Acid-Base Excess: 7 mmol/L — ABNORMAL HIGH (ref 0.0–2.0)
Bicarbonate: 31.5 mmol/L — ABNORMAL HIGH (ref 20.0–28.0)
Bicarbonate: 34 mmol/L — ABNORMAL HIGH (ref 20.0–28.0)
Calcium, Ion: 0.97 mmol/L — ABNORMAL LOW (ref 1.15–1.40)
Calcium, Ion: 1.08 mmol/L — ABNORMAL LOW (ref 1.15–1.40)
HCT: 36 % — ABNORMAL LOW (ref 39.0–52.0)
HCT: 39 % (ref 39.0–52.0)
Hemoglobin: 12.2 g/dL — ABNORMAL LOW (ref 13.0–17.0)
Hemoglobin: 13.3 g/dL (ref 13.0–17.0)
O2 Saturation: 39 %
O2 Saturation: 41 %
Potassium: 3.1 mmol/L — ABNORMAL LOW (ref 3.5–5.1)
Potassium: 3.4 mmol/L — ABNORMAL LOW (ref 3.5–5.1)
Sodium: 136 mmol/L (ref 135–145)
Sodium: 138 mmol/L (ref 135–145)
TCO2: 33 mmol/L — ABNORMAL HIGH (ref 22–32)
TCO2: 36 mmol/L — ABNORMAL HIGH (ref 22–32)
pCO2, Ven: 51.9 mmHg (ref 44–60)
pCO2, Ven: 55.4 mmHg (ref 44–60)
pH, Ven: 7.391 (ref 7.25–7.43)
pH, Ven: 7.396 (ref 7.25–7.43)
pO2, Ven: 23 mmHg — CL (ref 32–45)
pO2, Ven: 24 mmHg — CL (ref 32–45)

## 2022-08-10 LAB — PHOSPHORUS: Phosphorus: 4.4 mg/dL (ref 2.5–4.6)

## 2022-08-10 LAB — TROPONIN I (HIGH SENSITIVITY)
Troponin I (High Sensitivity): 103 ng/L (ref <18)
Troponin I (High Sensitivity): 90 ng/L — ABNORMAL HIGH (ref <18)

## 2022-08-10 LAB — POCT I-STAT 7, (LYTES, BLD GAS, ICA,H+H)
Acid-Base Excess: 2 mmol/L (ref 0.0–2.0)
Bicarbonate: 26.3 mmol/L (ref 20.0–28.0)
Calcium, Ion: 0.87 mmol/L — CL (ref 1.15–1.40)
HCT: 33 % — ABNORMAL LOW (ref 39.0–52.0)
Hemoglobin: 11.2 g/dL — ABNORMAL LOW (ref 13.0–17.0)
O2 Saturation: 96 %
Potassium: 2.8 mmol/L — ABNORMAL LOW (ref 3.5–5.1)
Sodium: 140 mmol/L (ref 135–145)
TCO2: 27 mmol/L (ref 22–32)
pCO2 arterial: 39.2 mmHg (ref 32–48)
pH, Arterial: 7.434 (ref 7.35–7.45)
pO2, Arterial: 79 mmHg — ABNORMAL LOW (ref 83–108)

## 2022-08-10 LAB — MAGNESIUM: Magnesium: 2 mg/dL (ref 1.7–2.4)

## 2022-08-10 LAB — BRAIN NATRIURETIC PEPTIDE: B Natriuretic Peptide: 1905 pg/mL — ABNORMAL HIGH (ref 0.0–100.0)

## 2022-08-10 SURGERY — RIGHT/LEFT HEART CATH AND CORONARY ANGIOGRAPHY
Anesthesia: LOCAL

## 2022-08-10 MED ORDER — ORAL CARE MOUTH RINSE: 15.0000 mL | OROMUCOSAL | Status: DC | PRN

## 2022-08-10 MED ORDER — VERAPAMIL HCL 2.5 MG/ML IV SOLN
INTRAVENOUS | Status: DC | PRN
  Administered 2022-08-10: 10 mL via INTRA_ARTERIAL

## 2022-08-10 MED ORDER — FUROSEMIDE 10 MG/ML IJ SOLN
80.0000 mg | Freq: Two times a day (BID) | INTRAMUSCULAR | Status: DC
  Filled 2022-08-10: qty 8

## 2022-08-10 MED ORDER — SODIUM CHLORIDE 0.9% FLUSH: 10.0000 mL | INTRAVENOUS | Status: DC | PRN

## 2022-08-10 MED ORDER — NOREPINEPHRINE 16 MG/250ML-% IV SOLN
0.0000 ug/min | INTRAVENOUS | Status: DC
  Administered 2022-08-10: 2 ug/min via INTRAVENOUS

## 2022-08-10 MED ORDER — INSULIN ASPART 100 UNIT/ML IJ SOLN: 0.0000 [IU] | Freq: Three times a day (TID) | INTRAMUSCULAR | Status: DC

## 2022-08-10 MED ORDER — VERAPAMIL HCL 2.5 MG/ML IV SOLN
INTRAVENOUS | Status: AC
  Filled 2022-08-10: qty 2

## 2022-08-10 MED ORDER — DORZOLAMIDE HCL-TIMOLOL MAL 2-0.5 % OP SOLN
1.0000 [drp] | Freq: Two times a day (BID) | OPHTHALMIC | Status: DC
  Administered 2022-08-25 – 2022-09-27 (×65): 1 [drp] via OPHTHALMIC
  Filled 2022-08-10 (×2): qty 10

## 2022-08-10 MED ORDER — LIDOCAINE HCL (PF) 1 % IJ SOLN
INTRAMUSCULAR | Status: AC
  Filled 2022-08-10: qty 30

## 2022-08-10 MED ORDER — APIXABAN 5 MG PO TABS: 5.0000 mg | ORAL_TABLET | Freq: Two times a day (BID) | ORAL | Status: DC

## 2022-08-10 MED ORDER — FUROSEMIDE 10 MG/ML IJ SOLN
INTRAMUSCULAR | Status: AC
  Filled 2022-08-10: qty 8

## 2022-08-10 MED ORDER — PANTOPRAZOLE SODIUM 40 MG PO TBEC
40.0000 mg | DELAYED_RELEASE_TABLET | Freq: Every day | ORAL | Status: DC
  Administered 2022-08-11 – 2022-08-28 (×13): 40 mg via ORAL
  Filled 2022-08-10 (×15): qty 1

## 2022-08-10 MED ORDER — LATANOPROST 0.005 % OP SOLN
1.0000 [drp] | Freq: Every day | OPHTHALMIC | Status: DC
  Administered 2022-08-25 – 2022-09-26 (×33): 1 [drp] via OPHTHALMIC
  Filled 2022-08-10 (×2): qty 2.5

## 2022-08-10 MED ORDER — IOHEXOL 350 MG/ML SOLN
INTRAVENOUS | Status: DC | PRN
  Administered 2022-08-10: 50 mL

## 2022-08-10 MED ORDER — ONDANSETRON HCL 4 MG/2ML IJ SOLN: 4.0000 mg | Freq: Four times a day (QID) | INTRAMUSCULAR | Status: DC | PRN

## 2022-08-10 MED ORDER — ENOXAPARIN SODIUM 40 MG/0.4ML IJ SOSY: 40.0000 mg | PREFILLED_SYRINGE | INTRAMUSCULAR | Status: DC

## 2022-08-10 MED ORDER — SODIUM CHLORIDE 0.9 % IV SOLN
250.0000 mL | INTRAVENOUS | Status: DC | PRN
  Administered 2022-08-10: 250 mL via INTRAVENOUS

## 2022-08-10 MED ORDER — LIDOCAINE HCL (PF) 1 % IJ SOLN
INTRAMUSCULAR | Status: DC | PRN
  Administered 2022-08-10: 10 mL via INTRADERMAL
  Administered 2022-08-10: 5 mL via INTRADERMAL

## 2022-08-10 MED ORDER — SODIUM CHLORIDE 0.9% FLUSH: 3.0000 mL | INTRAVENOUS | Status: DC | PRN

## 2022-08-10 MED ORDER — CHLORHEXIDINE GLUCONATE CLOTH 2 % EX PADS
6.0000 | MEDICATED_PAD | Freq: Every day | CUTANEOUS | Status: DC
  Administered 2022-08-10 – 2022-08-28 (×17): 6 via TOPICAL

## 2022-08-10 MED ORDER — FUROSEMIDE 10 MG/ML IJ SOLN
INTRAMUSCULAR | Status: DC | PRN
  Administered 2022-08-10: 80 mg via INTRAVENOUS

## 2022-08-10 MED ORDER — SODIUM CHLORIDE 0.9% FLUSH
10.0000 mL | Freq: Two times a day (BID) | INTRAVENOUS | Status: DC
  Administered 2022-08-10 – 2022-08-16 (×5): 10 mL
  Administered 2022-08-17 – 2022-08-18 (×2): 20 mL

## 2022-08-10 MED ORDER — SODIUM CHLORIDE 0.9% FLUSH
3.0000 mL | Freq: Two times a day (BID) | INTRAVENOUS | Status: DC
  Administered 2022-08-12 – 2022-08-13 (×2): 3 mL via INTRAVENOUS

## 2022-08-10 MED ORDER — SODIUM CHLORIDE 0.9% FLUSH
3.0000 mL | Freq: Two times a day (BID) | INTRAVENOUS | Status: DC
  Administered 2022-08-10 – 2022-08-17 (×7): 3 mL via INTRAVENOUS

## 2022-08-10 MED ORDER — LABETALOL HCL 5 MG/ML IV SOLN: 10.0000 mg | INTRAVENOUS | Status: AC | PRN

## 2022-08-10 MED ORDER — HYDRALAZINE HCL 20 MG/ML IJ SOLN: 10.0000 mg | INTRAMUSCULAR | Status: AC | PRN

## 2022-08-10 MED ORDER — HEPARIN SODIUM (PORCINE) 1000 UNIT/ML IJ SOLN
INTRAMUSCULAR | Status: AC
  Filled 2022-08-10: qty 10

## 2022-08-10 MED ORDER — POTASSIUM CHLORIDE CRYS ER 20 MEQ PO TBCR
40.0000 meq | EXTENDED_RELEASE_TABLET | ORAL | Status: AC
  Administered 2022-08-10: 40 meq via ORAL
  Filled 2022-08-10: qty 2

## 2022-08-10 MED ORDER — FUROSEMIDE 10 MG/ML IJ SOLN
80.0000 mg | Freq: Once | INTRAMUSCULAR | Status: AC
  Administered 2022-08-10: 80 mg via INTRAVENOUS
  Filled 2022-08-10: qty 8

## 2022-08-10 MED ORDER — MIDAZOLAM HCL 2 MG/2ML IJ SOLN
INTRAMUSCULAR | Status: AC
  Filled 2022-08-10: qty 2

## 2022-08-10 MED ORDER — ACETAMINOPHEN 325 MG PO TABS: 650.0000 mg | ORAL_TABLET | ORAL | Status: DC | PRN

## 2022-08-10 MED ORDER — NOREPINEPHRINE 4 MG/250ML-% IV SOLN: 0.0000 ug/min | INTRAVENOUS | Status: DC

## 2022-08-10 MED ORDER — ATORVASTATIN CALCIUM 80 MG PO TABS
80.0000 mg | ORAL_TABLET | Freq: Every day | ORAL | Status: DC
  Administered 2022-08-11 – 2022-08-30 (×17): 80 mg via ORAL
  Filled 2022-08-10 (×17): qty 1

## 2022-08-10 MED ORDER — HEPARIN (PORCINE) 25000 UT/250ML-% IV SOLN
1150.0000 [IU]/h | INTRAVENOUS | Status: DC
  Administered 2022-08-11: 1000 [IU]/h via INTRAVENOUS
  Administered 2022-08-11 – 2022-08-12 (×2): 1200 [IU]/h via INTRAVENOUS
  Administered 2022-08-13 – 2022-08-14 (×2): 1100 [IU]/h via INTRAVENOUS
  Administered 2022-08-15 – 2022-08-17 (×3): 1000 [IU]/h via INTRAVENOUS
  Administered 2022-08-18: 1050 [IU]/h via INTRAVENOUS
  Administered 2022-08-19 – 2022-08-20 (×2): 1100 [IU]/h via INTRAVENOUS
  Administered 2022-08-21: 1150 [IU]/h via INTRAVENOUS
  Filled 2022-08-10 (×13): qty 250

## 2022-08-10 MED ORDER — SODIUM CHLORIDE 0.9 % IV SOLN
250.0000 mL | INTRAVENOUS | Status: DC | PRN
  Administered 2022-08-11: 250 mL via INTRAVENOUS

## 2022-08-10 MED ORDER — CARVEDILOL 3.125 MG PO TABS: 3.1250 mg | ORAL_TABLET | Freq: Two times a day (BID) | ORAL | Status: DC

## 2022-08-10 MED ORDER — HEPARIN (PORCINE) IN NACL 1000-0.9 UT/500ML-% IV SOLN
INTRAVENOUS | Status: DC | PRN
  Administered 2022-08-10: 500 mL

## 2022-08-10 MED ORDER — DIGOXIN 125 MCG PO TABS
0.1250 mg | ORAL_TABLET | Freq: Every day | ORAL | Status: DC
  Administered 2022-08-11 – 2022-08-17 (×7): 0.125 mg via ORAL
  Filled 2022-08-10 (×7): qty 1

## 2022-08-10 MED ORDER — MILRINONE LACTATE IN DEXTROSE 20-5 MG/100ML-% IV SOLN
0.5000 ug/kg/min | INTRAVENOUS | Status: DC
  Administered 2022-08-10 – 2022-08-15 (×8): 0.25 ug/kg/min via INTRAVENOUS
  Administered 2022-08-16: 0.375 ug/kg/min via INTRAVENOUS
  Administered 2022-08-16: 0.25 ug/kg/min via INTRAVENOUS
  Administered 2022-08-17 – 2022-08-26 (×19): 0.375 ug/kg/min via INTRAVENOUS
  Administered 2022-08-26 – 2022-09-01 (×14): 0.5 ug/kg/min via INTRAVENOUS
  Filled 2022-08-10 (×46): qty 100

## 2022-08-10 MED ORDER — MIDAZOLAM HCL 2 MG/2ML IJ SOLN
INTRAMUSCULAR | Status: DC | PRN
  Administered 2022-08-10: 1 mg via INTRAVENOUS

## 2022-08-10 MED ORDER — MEXILETINE HCL 250 MG PO CAPS
250.0000 mg | ORAL_CAPSULE | Freq: Two times a day (BID) | ORAL | Status: DC
  Administered 2022-08-10 – 2022-08-28 (×36): 250 mg via ORAL
  Filled 2022-08-10 (×41): qty 1

## 2022-08-10 MED ORDER — SPIRONOLACTONE 12.5 MG HALF TABLET: 12.5000 mg | ORAL_TABLET | Freq: Every day | ORAL | Status: DC

## 2022-08-10 MED ORDER — MOMETASONE FURO-FORMOTEROL FUM 200-5 MCG/ACT IN AERO
2.0000 | INHALATION_SPRAY | Freq: Two times a day (BID) | RESPIRATORY_TRACT | Status: DC
  Administered 2022-08-10 – 2022-09-27 (×66): 2 via RESPIRATORY_TRACT
  Filled 2022-08-10 (×2): qty 8.8

## 2022-08-10 SURGICAL SUPPLY — 12 items
CATH 5FR JL3.5 JR4 ANG PIG MP (CATHETERS) IMPLANT
CATH BALLN WEDGE 5F 110CM (CATHETERS) IMPLANT
CATH INFINITI 5FR JL4 (CATHETERS) IMPLANT
DEVICE RAD COMP TR BAND LRG (VASCULAR PRODUCTS) IMPLANT
GLIDESHEATH SLEND SS 6F .021 (SHEATH) IMPLANT
GUIDEWIRE INQWIRE 1.5J.035X260 (WIRE) IMPLANT
INQWIRE 1.5J .035X260CM (WIRE) ×1 IMPLANT
KIT CV 3L 7FR 20CM SULFAFREE (SET/KITS/TRAYS/PACK) IMPLANT
PACK CARDIAC CATHETERIZATION (CUSTOM PROCEDURE TRAY) ×1 IMPLANT
SHEATH GLIDE SLENDER 4/5FR (SHEATH) IMPLANT
SHEATH PROBE COVER 6X72 (BAG) IMPLANT
TRANSDUCER W/STOPCOCK (MISCELLANEOUS) ×1 IMPLANT

## 2022-08-10 NOTE — Progress Notes (Signed)
ANTICOAGULATION CONSULT NOTE - Initial Consult  Pharmacy Consult for Heparin (Eliquis on hold) Indication: atrial fibrillation  Allergies  Allergen Reactions   Percocet [Oxycodone-Acetaminophen] Itching    Patient Measurements: Height: 5\' 7"  (170.2 cm) Weight: 77.1 kg (170 lb) IBW/kg (Calculated) : 66.1 Heparin Dosing Weight: total weight  Vital Signs: Temp: 97.5 F (36.4 C) (07/25 1524) Temp Source: Oral (07/25 1524) BP: 106/78 (07/25 1524) Pulse Rate: 81 (07/25 1524)  Labs: Recent Labs    08/10/22 1142 08/10/22 1649 08/10/22 1831  HGB 12.3*  --  11.2*  HCT 38.0*  --  33.0*  PLT 217  --   --   CREATININE 1.56*  --   --   TROPONINIHS 103* 90*  --     Estimated Creatinine Clearance: 47.7 mL/min (A) (by C-G formula based on SCr of 1.56 mg/dL (H)).   Medical History: Past Medical History:  Diagnosis Date   CAD (coronary artery disease)    CHF (congestive heart failure) (HCC)    GERD (gastroesophageal reflux disease)    Hyperlipidemia    Hypertension    Systolic heart failure (HCC) 2021   LVEF 18%, RVEF 38% on cardiac MRI 12/19/2019. possible cardiac sarcoidosis.   Wide-complex tachycardia 2021   wears LifeVest    Medications:  Scheduled:   atorvastatin  80 mg Oral Daily   carvedilol  3.125 mg Oral BID   [MAR Hold] digoxin  0.125 mg Oral Daily   dorzolamide-timolol  1 drop Both Eyes BID   [MAR Hold] furosemide  80 mg Intravenous BID   [START ON 08/11/2022] insulin aspart  0-9 Units Subcutaneous TID WC   latanoprost  1 drop Both Eyes QHS   mexiletine  250 mg Oral BID   mometasone-formoterol  2 puff Inhalation BID   [START ON 08/11/2022] pantoprazole  40 mg Oral QAC breakfast   [MAR Hold] potassium chloride  40 mEq Oral Q2H   sodium chloride flush  3 mL Intravenous Q12H   spironolactone  12.5 mg Oral QHS   Infusions:   sodium chloride     PRN: sodium chloride, lidocaine (PF), midazolam, ondansetron (ZOFRAN) IV, Radial Cocktail/Verapamil only, sodium  chloride flush  Assessment: 57 yo male on apixaban for afib, recent CVA felt to be cardioembolic. Presents for cath, work up for LVAD. Last dose of apixaban 7/25 at 07:30, Hgb 11.2, Plts WNL, SCr 1.56  Pharmacy consulted to transition apixaban to heparin - to begin 8hr after cath per Dr. Gala Romney.  Goal of Therapy:  Heparin level 0.3-0.7 units/ml Monitor platelets by anticoagulation protocol: Yes   Plan:  At 03:00, begin IV heparin infusion 1000 units/hr (no bolus) Check aPTT and HL in 8hrs - titrate using aPTT as HL will be falsely elevated with recent apixaban use Daily CBC, monitor for signs/symptoms of bleeding  Loralee Pacas, PharmD, BCPS 08/10/2022,6:52 PM  Please check AMION for all [facility] Pharmacy phone numbers After 10:00 PM, call Main Pharmacy [phone]

## 2022-08-10 NOTE — H&P (View-Only) (Signed)
Advanced Heart Failure Consult Note   PCP: [name], PA-C PCP-Cardiologist: Norman Herrlich, MD  [facility]: Dr. Gala Romney   This note reflects work done today.  Charles Holmes is seen today for evaluation of acute systolic heart failure / LVAD workup at the request of Dr. Anitra Lauth.   Charles Holmes is a 57 y.o. 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    Admitted to [facility] 11/21 with CP. Found to have VT and EF 25-30% mild AI, Charles and TR. Transferred to [facility]. Cath showed 70% prox LAD with no intervention.  He was discharged with Life Vest.    Presented to HF Clinic 02/03/19 with recurrent VT. Amio started. Referred to [facility] for PET. PET @ [facility] 2/22 showed EF 25% with active inflammation c/w sarcoid.   Re-admitted  02/2020 with recurrent VT and LifeVest alarms. + AKI. Started prednisone and PJP prophylaxis. ICD implanted. Amio switched due to mexilitene due to severe tremor.   S/P CRT-D upgrade 06/08/21.  He recent presented to [facility] with acute onset of L sided weakness. Was at home taking out his trash when he felt his legs all of a sudden give out. CT head was negative for acute abnormality. Received TPA at [facility]. CTA revealed an acute right M1 occlusion and he was emergently transferred to [facility] for a mechanical thrombectomy.  Post procedure developed hypotension. Given IVF w/ improvement of BP but developed subsequent volume overload. Echo showed LV markedly dilated EF < 20% and small effusion. RV ok. CVA felt to be cardioembolic. AHF team was consulted for HF management. Underwent RHC 6/28 which showed mean RA 9, PA 58/26, mean PCWP 30, CI 2.6. Diuresed w/ IV Lasix. Placed on GDMT, though limited some by soft BP. Anticoagulated w/ Eliquis. VAD/transplant workup in near future. He was discharged home on 07/16/22.  HF visit on 07/26/22.  Volume mildly elevated. He was set up for CPX  in September.  Today he returns for HF clinic follow up with his daughter  and grandson. Overall feeling terrible. Progressively worse with fatigue and shortness of breath. SOB performing ADLs. Denies PND/Orthopnea. Appetite poor. No fever or chills. Weight at home 166-169  pounds. Taking all medications.   Once admitted will place PICC, follow co-ox and start work up for LVAD. May need inotropic support.   Device interrogation: Optivol: Activity < 0.5 per day, Impedance down, Fluid index trending up. No VT. No AF. 99% BiV pacing.     Past Medical History:  Diagnosis Date   CAD (coronary artery disease)    CHF (congestive heart failure) (HCC)    GERD (gastroesophageal reflux disease)    Hyperlipidemia    Hypertension    Systolic heart failure (HCC) 2021   LVEF 18%, RVEF 38% on cardiac MRI 12/19/2019. possible cardiac sarcoidosis.   Wide-complex tachycardia 2021   wears LifeVest    No current facility-administered medications for this encounter.   Current Outpatient Medications  Medication Sig Dispense Refill   apixaban (ELIQUIS) 5 MG TABS tablet Take 1 tablet (5 mg total) by mouth 2 (two) times daily. 180 tablet 3   atorvastatin (LIPITOR) 80 MG tablet Take 1 tablet (80 mg total) by mouth daily. 90 tablet 3   carvedilol (COREG) 3.125 MG tablet Take 1 tablet (3.125 mg total) by mouth 2 (two) times daily. 180 tablet 3   dapagliflozin propanediol (FARXIGA) 10 MG TABS tablet Take 1 tablet (10 mg total) by mouth daily before breakfast.  90 tablet 3   dorzolamide-timolol (COSOPT) 22.3-6.8 MG/ML ophthalmic solution Place 1 drop into both eyes in the morning and at bedtime.     fluticasone-salmeterol (ADVAIR HFA) 230-21 MCG/ACT inhaler Inhale 2 puffs into the lungs 2 (two) times daily. 1 each 12   folic acid (FOLVITE) 1 MG tablet Take 1 tablet (1 mg total) by mouth daily. 90 tablet 3   furosemide (LASIX) 40 MG tablet Take 1 tablet (40 mg total) by mouth 2 (two) times daily. 90 tablet 3   latanoprost (XALATAN) 0.005 % ophthalmic solution Place 1 drop into both eyes at  bedtime.     methotrexate (RHEUMATREX) 2.5 MG tablet Take 8 tablets (20 mg total) by mouth once a week. 96 tablet 3   mexiletine (MEXITIL) 250 MG capsule Take 1 capsule (250 mg total) by mouth 2 (two) times daily. 180 capsule 3   pantoprazole (PROTONIX) 40 MG tablet Take 40 mg by mouth daily before breakfast.     spironolactone (ALDACTONE) 25 MG tablet TAKE 1/2 TABLET BY MOUTH DAILY (Patient taking differently: Take 12.5 mg by mouth at bedtime.) 45 tablet 0   Allergies  Allergen Reactions   Percocet [Oxycodone-Acetaminophen] Itching   Social History   Socioeconomic History   Marital status: Divorced    Spouse name: Not on file   Number of children: Not on file   Years of education: Not on file   Highest education level: Not on file  Occupational History   Not on file  Tobacco Use   Smoking status: Former    Types: Cigarettes   Smokeless tobacco: Never  Vaping Use   Vaping status: Unknown  Substance and Sexual Activity   Alcohol use: Yes    Alcohol/week: 6.0 standard drinks of alcohol    Types: 6 Cans of beer per week   Drug use: Yes    Types: Marijuana    Comment: stopped using months ago d/t how it affected breathing   Sexual activity: Yes  Other Topics Concern   Not on file  Social History Narrative   Not on file   Social Determinants of Health   Financial Resource Strain: Low Risk  (07/12/2022)   Overall Financial Resource Strain (CARDIA)    Difficulty of Paying Living Expenses: Not hard at all  Food Insecurity: No Food Insecurity (07/12/2022)   Hunger Vital Sign    Worried About Running Out of Food in the Last Year: Never true    Ran Out of Food in the Last Year: Never true  Transportation Needs: No Transportation Needs (07/12/2022)   PRAPARE - Administrator, Civil Service (Medical): No    Lack of Transportation (Non-Medical): No  Physical Activity: Not on file  Stress: Not on file  Social Connections: Not on file  Intimate Partner Violence: Not At  Risk (07/13/2022)   Humiliation, Afraid, Rape, and Kick questionnaire    Fear of Current or Ex-Partner: No    Emotionally Abused: No    Physically Abused: No    Sexually Abused: No   Family History  Problem Relation Age of Onset   Hypertension Father    Heart disease Father    Heart disease Mother    Vitals:   08/10/22 1215  BP: 116/84  Pulse: 87  Resp: 18  Temp: (!) 97.5 F (36.4 C)  TempSrc: Oral  SpO2: 95%    Wt Readings from Last 3 Encounters:  08/10/22 77.5 kg  07/26/22 76.8 kg  07/16/22 72.6 kg  Telemetry   NSR 70s with PVCs (Personally reviewed)    EKG    NSR 70s   Physical Exam   General:  chronically ill appearing.  No respiratory difficulty HEENT: normal Neck: supple. JVD to jaw. Carotids 2+ bilat; no bruits. No lymphadenopathy or thyromegaly appreciated. Cor: PMI nondisplaced. Regular rate & rhythm. No rubs, gallops or murmurs. Lungs: clear Abdomen: soft, nontender, nondistended. No hepatosplenomegaly. No bruits or masses. Good bowel sounds. Extremities: no cyanosis, clubbing, rash, +2 BLE edema cool to touch Neuro: alert & oriented x 3, cranial nerves grossly intact. moves all 4 extremities w/o difficulty. Affect pleasant.   ASSESSMENT & PLAN:  1.  Acute on chronic Systolic HF - Diagnosed 11/2019. Presented with VT. LHC 70% LAD  -- cMRI 12/21 concerning for sarcoid and EF 18%.  - PET 2/22 at [facility] EF 25% + active sarcoid - Echo 08/26/20 EF < 20% severely dilated LV RV mildly decreased.  - CPX 8/22. Unable to complete PFTs due to hypoventilation and poor effort. pVO2 16.1 (49% predicted). Slope 19 RER: 1.17 PETCO2 59 - [employer] CRT-D upgrade in 06/08/21 - Echo 11/07/21: EF 20-25% RV mild to moderately reduced.  - Echo 07/10/22: EF <20%, RV okay, mod pericardial effusion, mod Charles/TR - RHC 6/28 showed mean RA 9, PA 58/26, mean PCWP 30, CI 2.6>>diuresed w/ IV Lasix  Optivol - Fluid index trending up and thoracic impedance down. Activity trending down to  0.5 hours.  - NYHA IV. On exam he is volume overloaded and has had functional decline over the 2 weeks. Concerned this may be low output. Start lasix 80 mg BID - Place PICC, trend co-ox/CVP. May need inotropic support.  - Hold bb. No ARB for now.  - hold spiro 12.5 mg and Farxiga 10 mg daily with AKI  -L/RHC today The patient understands that risks included but are not limited to stroke (1 in 1000), death (1 in 1000), kidney failure [usually temporary] (1 in 500), bleeding (1 in 200), allergic reaction [possibly serious] (1 in 200).  The patient understands and agrees to proceed.  - continue digoxin 0.125 mcg daily.  - VAD coordinators aware, to see patient tomorrow and start formal work up.   2.  Hx of stroke -Admitted with acute L sided weakness 6/24, CTA revealed an acute right M1 occlusion. S/p TPA and mechanical clot extraction. No residual deficits  -Likely cardioembolic in setting of severe LV dysfunction. 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 EKG today A sensed- v paced w/ underlying rhythm sinus.  - Stop eliquis and place on Heparin drip until all procedures completed.   3. 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 Continue mexilitene  - now s/p ICD. No VT on interrogation.   4. CAD - LHC 12/07/19 70-% LAD, no intervention - No chest pain.  - Continue statin and aspirin.    5. Cardiac sarcoid - PET 2/22 at [facility] EF 25% + active sarcoid - Has completed prednisone. Now on MTX 20 weekly. Continue folic acid. - Previously discussed EM biopsy but patient is reluctant  - Needs repeat PET, wasn't able to schedule 10/23 as insurance would not cover. Now has medicaid and medicare. 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  - on Eliquis for recent CVA, will switch to heparin gtt  7. AKI - SCr 1.5, baseline around 1.1 - suspect cardiorenal - monitor with diuresis  8. Iron deficiency anemia -  recent T sat 15%, scheduled for OP feraheme tomorrow. Will complete inpatient after workup complete.    Alen Bleacher, NP  08/10/2022, 12:28 PM  Advanced Heart Failure Team Pager [phone] (M-F; 7a - 5p)  Please contact [facility] for night-coverage after hours (4p -7a ) and weekends on [email or web address]  Patient seen and examined with the above-signed Advanced Practice Provider and/or Housestaff. I personally reviewed laboratory data, imaging studies and relevant notes. I independently examined the patient and formulated the important aspects of the plan. I have edited the note to reflect any of my changes or salient points. I have personally discussed the plan with the patient and/or family.  57 y/o male with h/o non-obstructive CAD, PAF, recent CVA, cardiac sarcoid and severe systolic HF   Had previous CPX with severe limitation. But PFTs prohibitive for VAD. Had recent CVA. RHC at that time with preserved output.   Now admitted with marked volume overload and class IV HF.   General:  Weak appearing. No resp difficulty HEENT: normal Neck: supple. JVD to ear. Carotids 2+ bilat; no bruits. No lymphadenopathy or thryomegaly appreciated. Cor: Regular rate & rhythm.2/6 TR Lungs: clear Abdomen: soft, nontender, nondistended. No hepatosplenomegaly. No bruits or masses. Good bowel sounds. Extremities: no cyanosis, clubbing, rash, 2+ edema. cool Neuro: alert & orientedx3, cranial nerves grossly intact. moves all 4 extremities w/o difficulty. Affect pleasant  He has clear low output and volume overload on exam. Will take for R/L cath tonight. Will likely need inotropes. Will need repeat PFTs to review candidacy for VAD.   Arvilla Meres, MD  6:07 PM

## 2022-08-10 NOTE — Progress Notes (Addendum)
Pt arrived to 2H06 from CL at 1930. CXR taken to confirm CVC placement, and then Milrinone and Levo started per orders. Pt educated about medications and plan of care. Pt currently 92% on RA, denies SOB at rest, endorses DOE. Obvious orthopnea. Bilateral lower lung crackles, pt received lasix in [facility] prior to transfer. Mag 2.0. Ordered KCL replacement given. Will continue to monitor.

## 2022-08-10 NOTE — Interval H&P Note (Signed)
History and Physical Interval Note:  08/10/2022 6:14 PM  Charles Holmes  has presented today for surgery, with the diagnosis of heart failure.  The various methods of treatment have been discussed with the patient and family. After consideration of risks, benefits and other options for treatment, the patient has consented to  Procedure(s): RIGHT/LEFT HEART CATH AND CORONARY ANGIOGRAPHY (N/A) as a surgical intervention.  The patient's history has been reviewed, patient examined, no change in status, stable for surgery.  I have reviewed the patient's chart and labs.  Questions were answered to the patient's satisfaction.     [name]

## 2022-08-10 NOTE — ED Provider Notes (Addendum)
[place] EMERGENCY DEPARTMENT AT [facility] Provider Note   CSN: [id number] Arrival date & time: 08/10/22  1151     History  Chief Complaint  Patient presents with   Fatigue   Weakness    Charles Holmes is a 57 y.o. male.  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 NO VT. Presenting  from cards office due to feeling terrible despite taking all him meds.  Up 10lbs in the last month.  Patient reports now he cannot even take a shower without having to sit down to catch his breath.  Walking from 1 room to the other is difficult and even putting on his close.  He has no chest pain, syncope, fever or productive cough.  The swelling in his legs has just been gradually getting worse.  The history is provided by the patient and medical records.  Weakness      Home Medications Prior to Admission medications   Medication Sig Start Date End Date Taking? Authorizing Provider  apixaban (ELIQUIS) 5 MG TABS tablet Take 1 tablet (5 mg total) by mouth 2 (two) times daily. 07/26/22  Yes Robbie Lis M, PA-C  atorvastatin (LIPITOR) 80 MG tablet Take 1 tablet (80 mg total) by mouth daily. 07/26/22  Yes Robbie Lis M, PA-C  carvedilol (COREG) 3.125 MG tablet Take 1 tablet (3.125 mg total) by mouth 2 (two) times daily. 07/26/22  Yes Robbie Lis M, PA-C  dapagliflozin propanediol (FARXIGA) 10 MG TABS tablet Take 1 tablet (10 mg total) by mouth daily before breakfast. 07/26/22  Yes Robbie Lis M, PA-C  dorzolamide-timolol (COSOPT) 22.3-6.8 MG/ML ophthalmic solution Place 1 drop into both eyes in the morning and at bedtime. 04/29/20  Yes [provider]  fluticasone-salmeterol (ADVAIR HFA) 230-21 MCG/ACT inhaler Inhale 2 puffs into the lungs 2 (two) times daily.  12/13/20  Yes Olalere, Adewale A, MD  folic acid (FOLVITE) 1 MG tablet Take 1 tablet (1 mg total) by mouth daily. 07/26/22  Yes Robbie Lis M, PA-C  furosemide (LASIX) 40 MG tablet Take 1 tablet (40 mg total) by mouth 2 (two) times daily. 07/27/22  Yes Laurey Morale, MD  methotrexate (RHEUMATREX) 2.5 MG tablet Take 8 tablets (20 mg total) by mouth once a week. Patient taking differently: Take 20 mg by mouth once a week. Saturdays 07/26/22  Yes Robbie Lis M, PA-C  mexiletine (MEXITIL) 250 MG capsule Take 1 capsule (250 mg total) by mouth 2 (two) times daily. 11/07/21  Yes Camnitz, Will Daphine Deutscher, MD  latanoprost (XALATAN) 0.005 % ophthalmic solution Place 1 drop into both eyes at bedtime.    [provider]  pantoprazole (PROTONIX) 40 MG tablet Take 40 mg by mouth daily before breakfast.    [provider]  spironolactone (ALDACTONE) 25 MG tablet TAKE 1/2 TABLET BY MOUTH DAILY Patient taking differently: Take 12.5 mg by mouth at bedtime. 06/16/22   Bensimhon, Bevelyn Buckles, MD      Allergies    Percocet [oxycodone-acetaminophen]    Review of Systems   Review of Systems  Neurological:  Positive for weakness.    Physical Exam Updated Vital Signs BP 106/78 (BP Location: Right Arm)   Pulse 81   Temp (!) 97.5 F (36.4 C) (Oral)   Resp (!) 22  Ht 5\' 7"  (1.702 m)   Wt 77.1 kg   SpO2 99%   BMI 26.63 kg/m  Physical Exam Vitals and nursing note reviewed.  Constitutional:      General: He is not in acute distress.    Appearance: He is well-developed.     Comments: Chronically ill appearing  HENT:     Head: Normocephalic and atraumatic.  Eyes:     Conjunctiva/sclera: Conjunctivae normal.     Pupils: Pupils are equal, round, and reactive to light.     Comments: Healing right subconjunctival hemorrhage  Neck:     Comments: JVD present Cardiovascular:     Rate and Rhythm: Normal rate and regular rhythm.     Heart sounds: No murmur heard. Pulmonary:      Effort: Pulmonary effort is normal. No respiratory distress.     Breath sounds: Normal breath sounds. No wheezing or rales.     Comments: Pacemaker present in the left upper chest Abdominal:     General: There is no distension.     Palpations: Abdomen is soft.     Tenderness: There is no abdominal tenderness. There is no guarding or rebound.  Musculoskeletal:        General: No tenderness. Normal range of motion.     Cervical back: Normal range of motion and neck supple.     Right lower leg: Edema present.     Left lower leg: Edema present.     Comments: 3+ pitting edema in bilateral legs midway up the shin  Skin:    General: Skin is warm and dry.     Findings: No erythema or rash.  Neurological:     Mental Status: He is alert and oriented to person, place, and time. Mental status is at baseline.  Psychiatric:        Mood and Affect: Mood normal.        Behavior: Behavior normal.     ED Results / Procedures / Treatments   Labs (all labs ordered are listed, but only abnormal results are displayed) Labs Reviewed  TROPONIN I (HIGH SENSITIVITY)    EKG EKG Interpretation Date/Time:  Thursday August 10 2022 15:28:45 EDT Ventricular Rate:  79 PR Interval:  145 QRS Duration:  198 QT Interval:  433 QTC Calculation: 497 R Axis:   -63  Text Interpretation: Sinus rhythm Nonspecific IVCD with LAD Abnrm T, consider ischemia, anterolateral lds No significant change since last tracing Confirmed by Gwyneth Sprout ([id number]) on 08/10/2022 3:32:59 PM  Radiology DG Chest 2 View  Result Date: 08/10/2022 CLINICAL DATA:  CHF EXAM: CHEST - 2 VIEW COMPARISON:  07/11/2022 and older FINDINGS: Enlarged cardiopericardial silhouette. Hyperinflation with chronic lung changes. No consolidation, pneumothorax. Possible interstitial edema. No effusion. Left upper chest defibrillator. Old right-sided rib fractures. Degenerative changes of the spine. There is compression deformity of the lower thoracic  vertebral level with sclerotic margins. Favor a chronic process. IMPRESSION: Hyperinflation with chronic changes. Enlarged heart with defibrillator. Trace interstitial changes with Charyl Dancer B-lines Electronically Signed   By: Karen Kays M.D.   On: 08/10/2022 14:05   [place] EKG SITE RITE  Result Date: 08/10/2022 If Site Rite image not attached, placement could not be confirmed due to current cardiac rhythm.   Procedures Procedures    Medications Ordered in ED Medications  furosemide (LASIX) injection 80 mg (80 mg Intravenous Given 08/10/22 1544)    ED Course/ Medical Decision Making/ A&P  Medical Decision Making Amount and/or Complexity of Data Reviewed Labs: ordered. Decision-making details documented in ED Course. Radiology: ordered and independent interpretation performed. Decision-making details documented in ED Course. ECG/medicine tests: ordered and independent interpretation performed. Decision-making details documented in ED Course.  Risk Prescription drug management. Decision regarding hospitalization.   Pt with multiple medical problems and comorbidities and presenting today with a complaint that caries a high risk for morbidity and mortality.  Presenting today with worsening symptoms of what sounds like CHF.  Patient's EF is less than 20% currently blood pressure is 106/78 and he is stable but appears fluid overloaded.  He denies any chest pain and at rest is not short of breath.  Patient has been compliant with all his medications and they are considering him for VAD placement.  Weight is up 10 pounds.  He was seen by cardiology today and they were concerned for CHF exacerbation but also may have low output heart failure that might require inotropes.  They recommended he come to the hospital for admission and IV Lasix 80 mg twice daily holding beta-blockers and ACE inhibitors.  He had lab work drawn right before he came here which I independently  interpreted which showed a BNP which is increasing now to 1900 from 1700, CBC with stable hemoglobin, CMP with minimally elevated creatinine to 1.5 from 1.2 which is his baseline and mild elevated LFTs concerning for hepatorenal syndrome.  I independently interpreted patient's EKG which showed a paced rhythm which is unchanged.    I have independently visualized and interpreted pt's images today. CXR shows cardiomegaly with signs of pulm edema. Patient given 80 mg of IV Lasix.  Cardiology wants to have a PICC line placed to do a Co. ox to decide if he needs inotropes.  Patient is requiring admission for further care.  Cardiology consulted but after review and in light of pt's recent medical issues and stroke they want to consult and have pt admitted by medicine service.  Unassigned medicine consulted for admission.   CRITICAL CARE Performed by: Shakerra Red Total critical care time: 30 minutes Critical care time was exclusive of separately billable procedures and treating other patients. Critical care was necessary to treat or prevent imminent or life-threatening deterioration. Critical care was time spent personally by me on the following activities: development of treatment plan with patient and/or surrogate as well as nursing, discussions with consultants, evaluation of patient's response to treatment, examination of patient, obtaining history from patient or surrogate, ordering and performing treatments and interventions, ordering and review of laboratory studies, ordering and review of radiographic studies, pulse oximetry and re-evaluation of patient's condition.,.         Final Clinical Impression(s) / ED Diagnoses Final diagnoses:  Acute on chronic congestive heart failure, unspecified heart failure type [facility])    Rx / DC Orders ED Discharge Orders     None         Gwyneth Sprout, MD 08/10/22 1538    Gwyneth Sprout, MD 08/10/22 1538    Gwyneth Sprout,  MD 08/10/22 [phone]

## 2022-08-10 NOTE — Consult Note (Addendum)
Advanced Heart Failure Consult Note   PCP: [name], PA-C PCP-Cardiologist: Norman Herrlich, MD  [facility]: Dr. Gala Romney   This note reflects work done today.  Charles Holmes is seen today for evaluation of acute systolic heart failure / LVAD workup at the request of Dr. Anitra Lauth.   Charles Holmes is a 57 y.o. 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    Admitted to [facility] 11/21 with CP. Found to have VT and EF 25-30% mild AI, Charles and TR. Transferred to [facility]. Cath showed 70% prox LAD with no intervention.  He was discharged with Life Vest.    Presented to HF Clinic 02/03/19 with recurrent VT. Amio started. Referred to [facility] for PET. PET @ [facility] 2/22 showed EF 25% with active inflammation c/w sarcoid.   Re-admitted  02/2020 with recurrent VT and LifeVest alarms. + AKI. Started prednisone and PJP prophylaxis. ICD implanted. Amio switched due to mexilitene due to severe tremor.   S/P CRT-D upgrade 06/08/21.  He recent presented to [facility] with acute onset of L sided weakness. Was at home taking out his trash when he felt his legs all of a sudden give out. CT head was negative for acute abnormality. Received TPA at [facility]. CTA revealed an acute right M1 occlusion and he was emergently transferred to [facility] for a mechanical thrombectomy.  Post procedure developed hypotension. Given IVF w/ improvement of BP but developed subsequent volume overload. Echo showed LV markedly dilated EF < 20% and small effusion. RV ok. CVA felt to be cardioembolic. AHF team was consulted for HF management. Underwent RHC 6/28 which showed mean RA 9, PA 58/26, mean PCWP 30, CI 2.6. Diuresed w/ IV Lasix. Placed on GDMT, though limited some by soft BP. Anticoagulated w/ Eliquis. VAD/transplant workup in near future. He was discharged home on 07/16/22.  HF visit on 07/26/22.  Volume mildly elevated. He was set up for CPX  in September.  Today he returns for HF clinic follow up with his daughter  and grandson. Overall feeling terrible. Progressively worse with fatigue and shortness of breath. SOB performing ADLs. Denies PND/Orthopnea. Appetite poor. No fever or chills. Weight at home 166-169  pounds. Taking all medications.   Once admitted will place PICC, follow co-ox and start work up for LVAD. May need inotropic support.   Device interrogation: Optivol: Activity < 0.5 per day, Impedance down, Fluid index trending up. No VT. No AF. 99% BiV pacing.     Past Medical History:  Diagnosis Date   CAD (coronary artery disease)    CHF (congestive heart failure) (HCC)    GERD (gastroesophageal reflux disease)    Hyperlipidemia    Hypertension    Systolic heart failure (HCC) 2021   LVEF 18%, RVEF 38% on cardiac MRI 12/19/2019. possible cardiac sarcoidosis.   Wide-complex tachycardia 2021   wears LifeVest    No current facility-administered medications for this encounter.   Current Outpatient Medications  Medication Sig Dispense Refill   apixaban (ELIQUIS) 5 MG TABS tablet Take 1 tablet (5 mg total) by mouth 2 (two) times daily. 180 tablet 3   atorvastatin (LIPITOR) 80 MG tablet Take 1 tablet (80 mg total) by mouth daily. 90 tablet 3   carvedilol (COREG) 3.125 MG tablet Take 1 tablet (3.125 mg total) by mouth 2 (two) times daily. 180 tablet 3   dapagliflozin propanediol (FARXIGA) 10 MG TABS tablet Take 1 tablet (10 mg total) by mouth daily before breakfast.  90 tablet 3   dorzolamide-timolol (COSOPT) 22.3-6.8 MG/ML ophthalmic solution Place 1 drop into both eyes in the morning and at bedtime.     fluticasone-salmeterol (ADVAIR HFA) 230-21 MCG/ACT inhaler Inhale 2 puffs into the lungs 2 (two) times daily. 1 each 12   folic acid (FOLVITE) 1 MG tablet Take 1 tablet (1 mg total) by mouth daily. 90 tablet 3   furosemide (LASIX) 40 MG tablet Take 1 tablet (40 mg total) by mouth 2 (two) times daily. 90 tablet 3   latanoprost (XALATAN) 0.005 % ophthalmic solution Place 1 drop into both eyes at  bedtime.     methotrexate (RHEUMATREX) 2.5 MG tablet Take 8 tablets (20 mg total) by mouth once a week. 96 tablet 3   mexiletine (MEXITIL) 250 MG capsule Take 1 capsule (250 mg total) by mouth 2 (two) times daily. 180 capsule 3   pantoprazole (PROTONIX) 40 MG tablet Take 40 mg by mouth daily before breakfast.     spironolactone (ALDACTONE) 25 MG tablet TAKE 1/2 TABLET BY MOUTH DAILY (Patient taking differently: Take 12.5 mg by mouth at bedtime.) 45 tablet 0   Allergies  Allergen Reactions   Percocet [Oxycodone-Acetaminophen] Itching   Social History   Socioeconomic History   Marital status: Divorced    Spouse name: Not on file   Number of children: Not on file   Years of education: Not on file   Highest education level: Not on file  Occupational History   Not on file  Tobacco Use   Smoking status: Former    Types: Cigarettes   Smokeless tobacco: Never  Vaping Use   Vaping status: Unknown  Substance and Sexual Activity   Alcohol use: Yes    Alcohol/week: 6.0 standard drinks of alcohol    Types: 6 Cans of beer per week   Drug use: Yes    Types: Marijuana    Comment: stopped using months ago d/t how it affected breathing   Sexual activity: Yes  Other Topics Concern   Not on file  Social History Narrative   Not on file   Social Determinants of Health   Financial Resource Strain: Low Risk  (07/12/2022)   Overall Financial Resource Strain (CARDIA)    Difficulty of Paying Living Expenses: Not hard at all  Food Insecurity: No Food Insecurity (07/12/2022)   Hunger Vital Sign    Worried About Running Out of Food in the Last Year: Never true    Ran Out of Food in the Last Year: Never true  Transportation Needs: No Transportation Needs (07/12/2022)   PRAPARE - Administrator, Civil Service (Medical): No    Lack of Transportation (Non-Medical): No  Physical Activity: Not on file  Stress: Not on file  Social Connections: Not on file  Intimate Partner Violence: Not At  Risk (07/13/2022)   Humiliation, Afraid, Rape, and Kick questionnaire    Fear of Current or Ex-Partner: No    Emotionally Abused: No    Physically Abused: No    Sexually Abused: No   Family History  Problem Relation Age of Onset   Hypertension Father    Heart disease Father    Heart disease Mother    Vitals:   08/10/22 1215  BP: 116/84  Pulse: 87  Resp: 18  Temp: (!) 97.5 F (36.4 C)  TempSrc: Oral  SpO2: 95%    Wt Readings from Last 3 Encounters:  08/10/22 77.5 kg  07/26/22 76.8 kg  07/16/22 72.6 kg  Telemetry   NSR 70s with PVCs (Personally reviewed)    EKG    NSR 70s   Physical Exam   General:  chronically ill appearing.  No respiratory difficulty HEENT: normal Neck: supple. JVD to jaw. Carotids 2+ bilat; no bruits. No lymphadenopathy or thyromegaly appreciated. Cor: PMI nondisplaced. Regular rate & rhythm. No rubs, gallops or murmurs. Lungs: clear Abdomen: soft, nontender, nondistended. No hepatosplenomegaly. No bruits or masses. Good bowel sounds. Extremities: no cyanosis, clubbing, rash, +2 BLE edema cool to touch Neuro: alert & oriented x 3, cranial nerves grossly intact. moves all 4 extremities w/o difficulty. Affect pleasant.   ASSESSMENT & PLAN:  1.  Acute on chronic Systolic HF - Diagnosed 11/2019. Presented with VT. LHC 70% LAD  -- cMRI 12/21 concerning for sarcoid and EF 18%.  - PET 2/22 at [facility] EF 25% + active sarcoid - Echo 08/26/20 EF < 20% severely dilated LV RV mildly decreased.  - CPX 8/22. Unable to complete PFTs due to hypoventilation and poor effort. pVO2 16.1 (49% predicted). Slope 19 RER: 1.17 PETCO2 59 - [employer] CRT-D upgrade in 06/08/21 - Echo 11/07/21: EF 20-25% RV mild to moderately reduced.  - Echo 07/10/22: EF <20%, RV okay, mod pericardial effusion, mod Charles/TR - RHC 6/28 showed mean RA 9, PA 58/26, mean PCWP 30, CI 2.6>>diuresed w/ IV Lasix  Optivol - Fluid index trending up and thoracic impedance down. Activity trending down to  0.5 hours.  - NYHA IV. On exam he is volume overloaded and has had functional decline over the 2 weeks. Concerned this may be low output. Start lasix 80 mg BID - Place PICC, trend co-ox/CVP. May need inotropic support.  - Hold bb. No ARB for now.  - hold spiro 12.5 mg and Farxiga 10 mg daily with AKI  -L/RHC today The patient understands that risks included but are not limited to stroke (1 in 1000), death (1 in 1000), kidney failure [usually temporary] (1 in 500), bleeding (1 in 200), allergic reaction [possibly serious] (1 in 200).  The patient understands and agrees to proceed.  - continue digoxin 0.125 mcg daily.  - VAD coordinators aware, to see patient tomorrow and start formal work up.   2.  Hx of stroke -Admitted with acute L sided weakness 6/24, CTA revealed an acute right M1 occlusion. S/p TPA and mechanical clot extraction. No residual deficits  -Likely cardioembolic in setting of severe LV dysfunction. 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 EKG today A sensed- v paced w/ underlying rhythm sinus.  - Stop eliquis and place on Heparin drip until all procedures completed.   3. 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 Continue mexilitene  - now s/p ICD. No VT on interrogation.   4. CAD - LHC 12/07/19 70-% LAD, no intervention - No chest pain.  - Continue statin and aspirin.    5. Cardiac sarcoid - PET 2/22 at [facility] EF 25% + active sarcoid - Has completed prednisone. Now on MTX 20 weekly. Continue folic acid. - Previously discussed EM biopsy but patient is reluctant  - Needs repeat PET, wasn't able to schedule 10/23 as insurance would not cover. Now has medicaid and medicare. 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  - on Eliquis for recent CVA, will switch to heparin gtt  7. AKI - SCr 1.5, baseline around 1.1 - suspect cardiorenal - monitor with diuresis  8. Iron deficiency anemia -  recent T sat 15%, scheduled for OP feraheme tomorrow. Will complete inpatient after workup complete.    Charles Bleacher, NP  08/10/2022, 12:28 PM  Advanced Heart Failure Team Pager [phone] (M-F; 7a - 5p)  Please contact [facility] for night-coverage after hours (4p -7a ) and weekends on [email or web address]  Patient seen and examined with the above-signed Advanced Practice Provider and/or Housestaff. I personally reviewed laboratory data, imaging studies and relevant notes. I independently examined the patient and formulated the important aspects of the plan. I have edited the note to reflect any of my changes or salient points. I have personally discussed the plan with the patient and/or family.  57 y/o male with h/o non-obstructive CAD, PAF, recent CVA, cardiac sarcoid and severe systolic HF   Had previous CPX with severe limitation. But PFTs prohibitive for VAD. Had recent CVA. RHC at that time with preserved output.   Now admitted with marked volume overload and class IV HF.   General:  Weak appearing. No resp difficulty HEENT: normal Neck: supple. JVD to ear. Carotids 2+ bilat; no bruits. No lymphadenopathy or thryomegaly appreciated. Cor: Regular rate & rhythm.2/6 TR Lungs: clear Abdomen: soft, nontender, nondistended. No hepatosplenomegaly. No bruits or masses. Good bowel sounds. Extremities: no cyanosis, clubbing, rash, 2+ edema. cool Neuro: alert & orientedx3, cranial nerves grossly intact. moves all 4 extremities w/o difficulty. Affect pleasant  He has clear low output and volume overload on exam. Will take for R/L cath tonight. Will likely need inotropes. Will need repeat PFTs to review candidacy for VAD.   Charles Meres, MD  6:07 PM

## 2022-08-10 NOTE — ED Triage Notes (Signed)
Pt sent over from cardiologist for further evaluation of CHF. Pt complains of weakness and fatigue x 3 days. Occasional chest pain over the past few days. Denies any pain at this time.

## 2022-08-10 NOTE — Plan of Care (Signed)
  Problem: Education: Goal: Understanding of CV disease, CV risk reduction, and recovery process will improve Outcome: Progressing   Problem: Cardiovascular: Goal: Ability to achieve and maintain adequate cardiovascular perfusion will improve Outcome: Progressing   Problem: Cardiovascular: Goal: Vascular access site(s) Level 0-1 will be maintained Outcome: Progressing   Problem: Clinical Measurements: Goal: Ability to maintain clinical measurements within normal limits will improve Outcome: Progressing   Problem: Clinical Measurements: Goal: Diagnostic test results will improve Outcome: Progressing   Problem: Clinical Measurements: Goal: Respiratory complications will improve Outcome: Progressing   Problem: Clinical Measurements: Goal: Cardiovascular complication will be avoided Outcome: Progressing   Problem: Activity: Goal: Risk for activity intolerance will decrease Outcome: Progressing   Problem: Nutrition: Goal: Adequate nutrition will be maintained Outcome: Progressing   Problem: Coping: Goal: Level of anxiety will decrease Outcome: Progressing   Problem: Safety: Goal: Ability to remain free from injury will improve Outcome: Progressing   Problem: Skin Integrity: Goal: Risk for impaired skin integrity will decrease Outcome: Progressing

## 2022-08-10 NOTE — ED Notes (Signed)
Pt denied blood draw in triage states he already had blood drawn today

## 2022-08-10 NOTE — Progress Notes (Signed)
TR band removed at 2330, site level 0. Gauze and tegaderm dressing applied.

## 2022-08-10 NOTE — Progress Notes (Signed)
PICC team made attempt to see patient for PICC insertion while in the ED but patient had been transferred to the cath lab.

## 2022-08-10 NOTE — ED Notes (Signed)
Awaiting patient from lobby 

## 2022-08-10 NOTE — H&P (Addendum)
History and Physical    Charles Holmes:[id number] DOB: 21-Oct-1965 DOA: 08/10/2022  PCP: [name], PA-C (Confirm with patient/family/NH records and if not entered, this has to be entered at [facility] point of entry) Patient coming from: Home  I have personally briefly reviewed patient's old medical records in [facility]  Chief Complaint: Shortness of breath  HPI: Charles Holmes is a 57 y.o. 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  Patient denies any chest pain, cough, no fever or chills.  3 weeks ago, patient came to hospital for new onset of left-sided weakness went to [facility], was found to have a acute right MCA embolic stroke.  Patient received tPA treatment and shifted to [facility] and eventually underwent mechanical thrombectomy..  Etiology was considered to be embolic from cardiac source and as per recommendation from cardiology patient was discharged on Eliquis.  ED Course: Afebrile, none tachycardia nonhypotensive.  Chest x-ray showed mild pulmonary congestion.  Blood work showed potassium 3.3, creatinine 1.5 compared to baseline 1.1-1.2 several weeks ago on last admission.  Patient was given one-time dose of IV Lasix 80 mg injection in the ED  Review of Systems: As per HPI otherwise 14 point review of systems negative.    Past Medical History:  Diagnosis Date   CAD (coronary artery disease)    CHF (congestive heart failure) (HCC)    GERD (gastroesophageal reflux disease)    Hyperlipidemia    Hypertension    Systolic heart failure (HCC) 2021   LVEF 18%, RVEF 38% on cardiac MRI 12/19/2019. possible cardiac sarcoidosis.   Wide-complex tachycardia  2021   wears LifeVest    Past Surgical History:  Procedure Laterality Date   BIV UPGRADE N/A 06/07/2021   Procedure: BIV ICD UPGRADE;  Surgeon: Regan Lemming, MD;  Location: [facility] INVASIVE CV LAB;  Service: Cardiovascular;  Laterality: N/A;   ICD IMPLANT N/A 02/20/2020   Procedure: ICD IMPLANT;  Surgeon: Regan Lemming, MD;  Location: [facility] INVASIVE CV LAB;  Service: Cardiovascular;  Laterality: N/A;   IR CT HEAD LTD  07/10/2022   IR PERCUTANEOUS ART THROMBECTOMY/INFUSION INTRACRANIAL INC DIAG ANGIO  07/10/2022   IR US GUIDE VASC ACCESS RIGHT  07/10/2022   RADIOLOGY WITH ANESTHESIA N/A 07/10/2022   Procedure: RADIOLOGY WITH ANESTHESIA;  Surgeon: Radiologist, Medication, MD;  Location: [facility] OR;  Service: Radiology;  Laterality: N/A;   RIGHT HEART CATH N/A 07/14/2022   Procedure: RIGHT HEART CATH;  Surgeon: Dolores Patty, MD;  Location: [facility] INVASIVE CV LAB;  Service: Cardiovascular;  Laterality: N/A;   RIGHT/LEFT HEART CATH AND CORONARY ANGIOGRAPHY N/A 12/16/2019   Procedure: RIGHT/LEFT HEART CATH AND CORONARY ANGIOGRAPHY;  Surgeon: [place], Peter M, MD;  Location: [facility] INVASIVE CV LAB;  Service: Cardiovascular;  Laterality: N/A;     reports that he has quit smoking. His smoking use included cigarettes. He has never used smokeless tobacco. He reports current alcohol use of about 6.0 standard drinks of alcohol per week. He reports current drug use. Drug: Marijuana.  Allergies  Allergen Reactions   Percocet [Oxycodone-Acetaminophen] Itching    Family History  Problem Relation Age of Onset   Hypertension Father    Heart disease Father    Heart disease Mother  Prior to Admission medications   Medication Sig Start Date End Date Taking? Authorizing Provider  apixaban (ELIQUIS) 5 MG TABS tablet Take 1 tablet (5 mg total) by mouth 2 (two) times daily. 07/26/22  Yes Robbie Lis M, PA-C  atorvastatin (LIPITOR) 80 MG tablet Take 1 tablet (80 mg total) by mouth daily. 07/26/22  Yes  Robbie Lis M, PA-C  carvedilol (COREG) 3.125 MG tablet Take 1 tablet (3.125 mg total) by mouth 2 (two) times daily. 07/26/22  Yes Robbie Lis M, PA-C  dapagliflozin propanediol (FARXIGA) 10 MG TABS tablet Take 1 tablet (10 mg total) by mouth daily before breakfast. 07/26/22  Yes Robbie Lis M, PA-C  dorzolamide-timolol (COSOPT) 22.3-6.8 MG/ML ophthalmic solution Place 1 drop into both eyes in the morning and at bedtime. 04/29/20  Yes [provider]  fluticasone-salmeterol (ADVAIR HFA) 230-21 MCG/ACT inhaler Inhale 2 puffs into the lungs 2 (two) times daily. 12/13/20  Yes Olalere, Adewale A, MD  folic acid (FOLVITE) 1 MG tablet Take 1 tablet (1 mg total) by mouth daily. 07/26/22  Yes Robbie Lis M, PA-C  furosemide (LASIX) 40 MG tablet Take 1 tablet (40 mg total) by mouth 2 (two) times daily. 07/27/22  Yes Laurey Morale, MD  latanoprost (XALATAN) 0.005 % ophthalmic solution Place 1 drop into both eyes at bedtime.   Yes [provider]  methotrexate (RHEUMATREX) 2.5 MG tablet Take 8 tablets (20 mg total) by mouth once a week. Patient taking differently: Take 20 mg by mouth once a week. Saturdays 07/26/22  Yes Robbie Lis M, PA-C  mexiletine (MEXITIL) 250 MG capsule Take 1 capsule (250 mg total) by mouth 2 (two) times daily. 11/07/21  Yes Camnitz, Will Daphine Deutscher, MD  pantoprazole (PROTONIX) 40 MG tablet Take 40 mg by mouth daily before breakfast.   Yes [provider]  spironolactone (ALDACTONE) 25 MG tablet TAKE 1/2 TABLET BY MOUTH DAILY Patient taking differently: Take 12.5 mg by mouth at bedtime. 06/16/22  Yes Bensimhon, Bevelyn Buckles, MD    Physical Exam: Vitals:   08/10/22 1215 08/10/22 1240 08/10/22 1524 08/10/22 1812  BP: 116/84  106/78   Pulse: 87  81   Resp: 18  (!) 22   Temp: (!) 97.5 F (36.4 C)  (!) 97.5 F (36.4 C)   TempSrc: Oral  Oral   SpO2: 95%  99% 94%  Weight:  77.1 kg    Height:  5\' 7"  (1.702 m)      Constitutional:  NAD, calm, comfortable Vitals:   08/10/22 1215 08/10/22 1240 08/10/22 1524 08/10/22 1812  BP: 116/84  106/78   Pulse: 87  81   Resp: 18  (!) 22   Temp: (!) 97.5 F (36.4 C)  (!) 97.5 F (36.4 C)   TempSrc: Oral  Oral   SpO2: 95%  99% 94%  Weight:  77.1 kg    Height:  5\' 7"  (1.702 m)     Eyes: PERRL, lids and conjunctivae normal ENMT: Mucous membranes are moist. Posterior pharynx clear of any exudate or lesions.Normal dentition.  Neck: normal, supple, no masses, no thyromegaly.  JVD about 7 cm above clavicles Respiratory: clear to auscultation bilaterally, no wheezing, fine crackles on bilateral lower fields, increasing respiratory effort. No accessory muscle use.  Cardiovascular: Regular rate and rhythm, no murmurs / rubs / gallops. 2+ extremity edema. 2+ pedal pulses. No carotid bruits.  Abdomen: no tenderness, no masses palpated. No hepatosplenomegaly. Bowel sounds positive.  Musculoskeletal: no clubbing / cyanosis. No joint deformity upper  and lower extremities. Good ROM, no contractures. Normal muscle tone.  Skin: no rashes, lesions, ulcers. No induration Neurologic: CN 2-12 grossly intact. Sensation intact, DTR normal. Strength 5/5 in all 4.  Psychiatric: Normal judgment and insight. Alert and oriented x 3. Normal mood.     Labs on Admission: I have personally reviewed following labs and imaging studies  CBC: Recent Labs  Lab 08/10/22 1142 08/10/22 1831  WBC 10.2  --   HGB 12.3* 11.2*  HCT 38.0* 33.0*  MCV 103.0*  --   PLT 217  --    Basic Metabolic Panel: Recent Labs  Lab 08/10/22 1142 08/10/22 1831  NA 136 140  K 3.3* 2.8*  CL 91*  --   CO2 33*  --   GLUCOSE 110*  --   BUN 29*  --   CREATININE 1.56*  --   CALCIUM 9.3  --    GFR: Estimated Creatinine Clearance: 47.7 mL/min (A) (by C-G formula based on SCr of 1.56 mg/dL (H)). Liver Function Tests: Recent Labs  Lab 08/10/22 1142  AST 61*  ALT 77*  ALKPHOS 77  BILITOT 3.4*  PROT 6.9  ALBUMIN 3.8    No results for input(s): "LIPASE", "AMYLASE" in the last 168 hours. No results for input(s): "AMMONIA" in the last 168 hours. Coagulation Profile: No results for input(s): "INR", "PROTIME" in the last 168 hours. Cardiac Enzymes: No results for input(s): "CKTOTAL", "CKMB", "CKMBINDEX", "TROPONINI" in the last 168 hours. BNP (last 3 results) No results for input(s): "PROBNP" in the last 8760 hours. HbA1C: No results for input(s): "HGBA1C" in the last 72 hours. CBG: No results for input(s): "GLUCAP" in the last 168 hours. Lipid Profile: No results for input(s): "CHOL", "HDL", "LDLCALC", "TRIG", "CHOLHDL", "LDLDIRECT" in the last 72 hours. Thyroid Function Tests: No results for input(s): "TSH", "T4TOTAL", "FREET4", "T3FREE", "THYROIDAB" in the last 72 hours. Anemia Panel: No results for input(s): "VITAMINB12", "FOLATE", "FERRITIN", "TIBC", "IRON", "RETICCTPCT" in the last 72 hours. Urine analysis: No results found for: "COLORURINE", "APPEARANCEUR", "LABSPEC", "PHURINE", "GLUCOSEU", "HGBUR", "BILIRUBINUR", "KETONESUR", "PROTEINUR", "UROBILINOGEN", "NITRITE", "LEUKOCYTESUR"  Radiological Exams on Admission: DG Chest 2 View  Result Date: 08/10/2022 CLINICAL DATA:  CHF EXAM: CHEST - 2 VIEW COMPARISON:  07/11/2022 and older FINDINGS: Enlarged cardiopericardial silhouette. Hyperinflation with chronic lung changes. No consolidation, pneumothorax. Possible interstitial edema. No effusion. Left upper chest defibrillator. Old right-sided rib fractures. Degenerative changes of the spine. There is compression deformity of the lower thoracic vertebral level with sclerotic margins. Favor a chronic process. IMPRESSION: Hyperinflation with chronic changes. Enlarged heart with defibrillator. Trace interstitial changes with Charyl Dancer B-lines Electronically Signed   By: Karen Kays M.D.   On: 08/10/2022 14:05   [place] EKG SITE RITE  Result Date: 08/10/2022 If Site Rite image not attached, placement could not be  confirmed due to current cardiac rhythm.   EKG: Independently reviewed.  Sinus rhythm, no acute ST changes.  Assessment/Plan Principal Problem:   CHF (congestive heart failure) (HCC) Active Problems:   Acute systolic CHF (congestive heart failure) (HCC)   CAD (coronary artery disease)   Stroke (cerebrum) (HCC)  (please populate well all problems here in Problem List. (For example, if patient is on BP meds at home and you resume or decide to hold them, it is a problem that needs to be her. Same for CAD, COPD, HLD and so on)  Acute on chronic HFrEF decompensation -Significant fluid overload -Agreed with aggressive diuresis, cardiology consultation appreciated.  Agreed with IV Lasix 80 mg  twice daily -Most recent RHC on 6/28 showed moderate pulmonary hypertension and increased PCWP.  Echo was done recently, will not repeat at this point. -As per cardiologist plan, PICC line ordered for possible inotropic infusion -Agreed with hold off ARB and spironolactone, and will hold off Farxiga (taking for CHF, A1C=5.1)  AKI -Significant volume overload, likely secondary to cardiorenal syndrome -Diuresis and follow-up daily BMP  Hypokalemia -P.o. replacement, check mag and Phos level  History of recent right MCA embolic stroke -No acute concern, etiology was considered to related to severe LV dysfunction as well as PAF, as per request by cardiology will hold off Eliquis and start heparin drip  History of CAD -No acute concern, continue aspirin and statin  History of nonischemic cardiomyopathy -Etiology was considered to related to possible cardiac sarcoidosis -Patient underwent steroid treatment and tapered off.  Now on the methotrexate 20 mg daily, and outpatient follow-up at [facility] cardiology    DVT prophylaxis: Heparin drip Code Status: Full code Family Communication: None at bedside Disposition Plan: Patient is sick with significant CHF decompensation requiring inpatient cardiology  consultation and IV diuresis, expect more than 2 midnight hospital stay Consults called: Cardiology Admission status: Telemetry admission   Emeline General MD [facility] Pager [phone]  08/10/2022, 7:03 PM

## 2022-08-11 ENCOUNTER — Encounter (HOSPITAL_COMMUNITY): Payer: Self-pay | Admitting: Internal Medicine

## 2022-08-11 ENCOUNTER — Inpatient Hospital Stay (HOSPITAL_COMMUNITY): Payer: Medicare Other

## 2022-08-11 ENCOUNTER — Inpatient Hospital Stay (HOSPITAL_COMMUNITY)
Admission: RE | Admit: 2022-08-11 | Discharge: 2022-08-11 | Disposition: A | Discharge status: Home / Self Care | Payer: Medicare Other | Source: Ambulatory Visit | Attending: Internal Medicine | Admitting: Internal Medicine

## 2022-08-11 DIAGNOSIS — Z515 Encounter for palliative care: Secondary | ICD-10-CM | POA: Diagnosis not present

## 2022-08-11 DIAGNOSIS — Z01810 Encounter for preprocedural cardiovascular examination: Secondary | ICD-10-CM | POA: Diagnosis not present

## 2022-08-11 DIAGNOSIS — R570 Cardiogenic shock: Secondary | ICD-10-CM | POA: Diagnosis not present

## 2022-08-11 DIAGNOSIS — I472 Ventricular tachycardia, unspecified: Secondary | ICD-10-CM

## 2022-08-11 DIAGNOSIS — D509 Iron deficiency anemia, unspecified: Secondary | ICD-10-CM

## 2022-08-11 DIAGNOSIS — N179 Acute kidney failure, unspecified: Secondary | ICD-10-CM

## 2022-08-11 DIAGNOSIS — Z7189 Other specified counseling: Secondary | ICD-10-CM | POA: Diagnosis not present

## 2022-08-11 DIAGNOSIS — I509 Heart failure, unspecified: Secondary | ICD-10-CM | POA: Diagnosis not present

## 2022-08-11 DIAGNOSIS — I255 Ischemic cardiomyopathy: Secondary | ICD-10-CM

## 2022-08-11 DIAGNOSIS — I25118 Atherosclerotic heart disease of native coronary artery with other forms of angina pectoris: Secondary | ICD-10-CM | POA: Diagnosis not present

## 2022-08-11 DIAGNOSIS — I480 Paroxysmal atrial fibrillation: Secondary | ICD-10-CM

## 2022-08-11 DIAGNOSIS — I5023 Acute on chronic systolic (congestive) heart failure: Secondary | ICD-10-CM | POA: Diagnosis not present

## 2022-08-11 DIAGNOSIS — Z8673 Personal history of transient ischemic attack (TIA), and cerebral infarction without residual deficits: Secondary | ICD-10-CM

## 2022-08-11 DIAGNOSIS — D8685 Sarcoid myocarditis: Secondary | ICD-10-CM

## 2022-08-11 LAB — LIPID PANEL
Cholesterol: 90 mg/dL (ref 0–200)
HDL: 26 mg/dL — ABNORMAL LOW (ref >40)
LDL Cholesterol: 51 mg/dL (ref 0–99)
Total CHOL/HDL Ratio: 3.5 RATIO
Triglycerides: 64 mg/dL (ref <150)
VLDL: 13 mg/dL (ref 0–40)

## 2022-08-11 LAB — CBC WITH DIFFERENTIAL/PLATELET
Abs Immature Granulocytes: 0.04 10*3/uL (ref 0.00–0.07)
Basophils Absolute: 0.1 10*3/uL (ref 0.0–0.1)
Basophils Relative: 1 %
Eosinophils Absolute: 0.1 10*3/uL (ref 0.0–0.5)
Eosinophils Relative: 1 %
HCT: 35.9 % — ABNORMAL LOW (ref 39.0–52.0)
Hemoglobin: 11.9 g/dL — ABNORMAL LOW (ref 13.0–17.0)
Immature Granulocytes: 0 %
Lymphocytes Relative: 6 %
Lymphs Abs: 0.6 10*3/uL — ABNORMAL LOW (ref 0.7–4.0)
MCH: 34.6 pg — ABNORMAL HIGH (ref 26.0–34.0)
MCHC: 33.1 g/dL (ref 30.0–36.0)
MCV: 104.4 fL — ABNORMAL HIGH (ref 80.0–100.0)
Monocytes Absolute: 1.4 10*3/uL — ABNORMAL HIGH (ref 0.1–1.0)
Monocytes Relative: 14 %
Neutro Abs: 8.2 10*3/uL — ABNORMAL HIGH (ref 1.7–7.7)
Neutrophils Relative %: 78 %
Platelets: 181 10*3/uL (ref 150–400)
RBC: 3.44 MIL/uL — ABNORMAL LOW (ref 4.22–5.81)
RDW: 16.9 % — ABNORMAL HIGH (ref 11.5–15.5)
WBC: 10.4 10*3/uL (ref 4.0–10.5)
nRBC: 0.2 % (ref 0.0–0.2)

## 2022-08-11 LAB — URINALYSIS, ROUTINE W REFLEX MICROSCOPIC
Bilirubin Urine: NEGATIVE
Glucose, UA: 150 mg/dL — AB
Hgb urine dipstick: NEGATIVE
Ketones, ur: NEGATIVE mg/dL
Leukocytes,Ua: NEGATIVE
Nitrite: NEGATIVE
Protein, ur: NEGATIVE mg/dL
Specific Gravity, Urine: 1.006 (ref 1.005–1.030)
pH: 7 (ref 5.0–8.0)

## 2022-08-11 LAB — TSH: TSH: 1.211 u[IU]/mL (ref 0.350–4.500)

## 2022-08-11 LAB — T4, FREE: Free T4: 1.84 ng/dL — ABNORMAL HIGH (ref 0.61–1.12)

## 2022-08-11 LAB — RAPID URINE DRUG SCREEN, HOSP PERFORMED
Amphetamines: NOT DETECTED
Barbiturates: NOT DETECTED
Benzodiazepines: NOT DETECTED
Cocaine: NOT DETECTED
Opiates: NOT DETECTED
Tetrahydrocannabinol: NOT DETECTED

## 2022-08-11 LAB — ABO/RH: ABO/RH(D): O POS

## 2022-08-11 LAB — TYPE AND SCREEN
ABO/RH(D): O POS
Antibody Screen: NEGATIVE

## 2022-08-11 LAB — HEPATITIS B SURFACE ANTIBODY,QUALITATIVE: Hep B S Ab: NONREACTIVE

## 2022-08-11 LAB — PSA: Prostatic Specific Antigen: 1.01 ng/mL (ref 0.00–4.00)

## 2022-08-11 LAB — PREALBUMIN: Prealbumin: 17 mg/dL — ABNORMAL LOW (ref 18–38)

## 2022-08-11 LAB — PROTIME-INR
INR: 1.7 — ABNORMAL HIGH (ref 0.8–1.2)
Prothrombin Time: 19.7 seconds — ABNORMAL HIGH (ref 11.4–15.2)

## 2022-08-11 LAB — APTT: aPTT: 75 seconds — ABNORMAL HIGH (ref 24–36)

## 2022-08-11 LAB — MAGNESIUM: Magnesium: 2.2 mg/dL (ref 1.7–2.4)

## 2022-08-11 LAB — POTASSIUM: Potassium: 3.2 mmol/L — ABNORMAL LOW (ref 3.5–5.1)

## 2022-08-11 LAB — LACTATE DEHYDROGENASE: LDH: 242 U/L — ABNORMAL HIGH (ref 98–192)

## 2022-08-11 LAB — ANTITHROMBIN III: AntiThromb III Func: 90 % (ref 75–120)

## 2022-08-11 LAB — URIC ACID: Uric Acid, Serum: 8.3 mg/dL (ref 3.7–8.6)

## 2022-08-11 LAB — HEPATITIS B CORE ANTIBODY, TOTAL: Hep B Core Total Ab: NONREACTIVE

## 2022-08-11 LAB — HEPATITIS B SURFACE ANTIGEN: Hepatitis B Surface Ag: NONREACTIVE

## 2022-08-11 MED ORDER — POTASSIUM CHLORIDE CRYS ER 20 MEQ PO TBCR
40.0000 meq | EXTENDED_RELEASE_TABLET | Freq: Once | ORAL | Status: AC
  Administered 2022-08-11: 40 meq via ORAL
  Filled 2022-08-11: qty 2

## 2022-08-11 MED ORDER — POTASSIUM CHLORIDE CRYS ER 20 MEQ PO TBCR
60.0000 meq | EXTENDED_RELEASE_TABLET | ORAL | Status: AC
  Administered 2022-08-11 (×2): 60 meq via ORAL
  Filled 2022-08-11 (×2): qty 3

## 2022-08-11 MED ORDER — FUROSEMIDE 10 MG/ML IJ SOLN
10.0000 mg/h | INTRAVENOUS | Status: DC
  Administered 2022-08-11 – 2022-08-13 (×4): 10 mg/h via INTRAVENOUS
  Filled 2022-08-11 (×5): qty 20

## 2022-08-11 MED ORDER — POTASSIUM CHLORIDE 10 MEQ/50ML IV SOLN
10.0000 meq | INTRAVENOUS | Status: AC
  Administered 2022-08-11 (×2): 10 meq via INTRAVENOUS
  Filled 2022-08-11 (×2): qty 50

## 2022-08-11 MED ORDER — METHOTREXATE SODIUM 2.5 MG PO TABS
20.0000 mg | ORAL_TABLET | ORAL | Status: DC
  Administered 2022-08-12: 20 mg via ORAL
  Filled 2022-08-11: qty 8

## 2022-08-11 MED ORDER — FOLIC ACID 1 MG PO TABS
1.0000 mg | ORAL_TABLET | Freq: Every day | ORAL | Status: DC
  Administered 2022-08-11 – 2022-08-28 (×16): 1 mg via ORAL
  Filled 2022-08-11 (×16): qty 1

## 2022-08-11 MED FILL — Heparin Sodium (Porcine) Inj 1000 Unit/ML: INTRAMUSCULAR | Qty: 10 | Status: AC

## 2022-08-11 NOTE — Consult Note (Signed)
Palliative Care Consult Note                                  Date: 08/11/2022   Patient Name: Charles Holmes  DOB: 1965/10/21  MRN: [id number]  Age / Sex: 57 y.o., male  PCP: [name], PA-C Referring Physician: Coralie Keens  Reason for Consultation: Establishing goals of care in the setting of LVAD evaluation  HPI/Patient Profile: 57 y.o. 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  He is now in the ICU with milrinone and NE support being evaluated for LVAD.    Past Medical History:  Diagnosis Date   CAD (coronary artery disease)    CHF (congestive heart failure) (HCC)    GERD (gastroesophageal reflux disease)    Hyperlipidemia    Hypertension    Systolic heart failure (HCC) 2021   LVEF 18%, RVEF 38% on cardiac MRI 12/19/2019. possible cardiac sarcoidosis.   Wide-complex tachycardia 2021   wears LifeVest    Subjective:   I have reviewed medical records including EPIC notes, labs and imaging, assessed the patient and then met with the patient in his room to discuss diagnosis prognosis, GOC, EOL wishes, disposition and options.  I introduced Palliative Medicine as specialized medical care for people living with serious illness. It focuses on providing relief from symptoms and stress of a serious illness. The goal is to improve quality of life for both the patient and the family.  Patient/Family Understanding of Illness: The patient has a good understanding of how critical his illness is.  Social History: The patient lives in [place]. He has one adult daughter who lives near him. He was in [employer] before he was unable to work.  He used to enjoy putt putt with his grandson but now he is too fatigued to go.   Functional and Nutritional State: The patient was living independently prior to admission. He was struggling to accomplish ADLs and IADLs due to fatigue and shortness of breath. Tasks like showering were difficult and he would have to pace himself with breaks. His appetite has been poor since his stroke last month.   Today's Discussion: We discussed his worsening health since 2021. He is interested in being evaluated for the LVAD but does note he does not have anybody to be his caregiver. His sister just had a stroke and takes care of their parents. His daughter is pregnant and her husband is expecting deployment towards the end of the year. He asks if there are other discharge options for people without caregivers. I told him I would look into this.  He admits he is worried about having such a major surgery and we discussed that this is reasonable. He is also concerned about what will happen if he is not a candidate for the LVAD.   We discussed advanced directives. He would like his daughter to be his health care decision maker if he were unable. We discussed code status and he confirmed he would like to remain FULL code. MOST was not introduced at this time.   Discussed the importance of continued conversation with family and the medical providers regarding overall plan of  care and treatment options, ensuring decisions are within the context of the patient's values and GOCs.  Questions and concerns were addressed. The patient was encouraged to call with questions or concerns. PMT will continue to support holistically.   Review of Systems  Constitutional:  Positive for fatigue.  Respiratory:  Positive for shortness of breath.     Objective:   Primary Diagnoses: Present on Admission:  Acute systolic CHF (congestive heart failure) (HCC)  CAD (coronary artery disease)  Stroke (cerebrum) [facility])   Physical  Exam Vitals reviewed.  HENT:     Head: Normocephalic and atraumatic.  Pulmonary:     Effort: Pulmonary effort is normal.  Skin:    General: Skin is warm and dry.  Neurological:     Mental Status: He is alert and oriented to person, place, and time.  Psychiatric:        Mood and Affect: Mood normal.        Behavior: Behavior normal.        Thought Content: Thought content normal.     Vital Signs:  BP 109/77   Pulse 81   Temp 98 F (36.7 C) (Oral)   Resp (!) 22   Ht 5\' 7"  (1.702 m)   Wt 75.3 kg   SpO2 96%   BMI 26.00 kg/m    Advanced Care Planning:   Existing Vynca/ACP Documentation: None  Primary Decision Maker: The patient is the primary decision maker. If he were unable to make a decision he would like his daughter Charles Holmes to be his proxy Management consultant.  Code Status/Advance Care Planning: Full code   Assessment & Plan:   SUMMARY OF RECOMMENDATIONS   Full code- reconfirmed today Full scope Evaluation for LVAD Encouraged continued discussion between patient and his family regarding plan and GOC Notified CM of patient's concern regarding caregiver Continued PMT support   Discussed with: bedside RN and CM  Thank you for allowing [place] to participate in the care of Charles Holmes PMT will continue to support holistically.  Time Total: 75 minutes   Signed by: Sarina Ser, NP Palliative Medicine Team  Team Phone # [phone] (Nights/Weekends)  08/11/2022, 2:25 PM

## 2022-08-11 NOTE — Progress Notes (Addendum)
ANTICOAGULATION CONSULT NOTE Pharmacy Consult for Heparin  Indication: atrial fibrillation Brief A/P: aPTT within goal range Continue Heparin at current rate   Allergies  Allergen Reactions   Amiodarone     Severe tremors   Percocet [Oxycodone-Acetaminophen] Itching    Patient Measurements: Height: 5\' 7"  (170.2 cm) Weight: 75.3 kg (166 lb 0.1 oz) IBW/kg (Calculated) : 66.1 Heparin Dosing Weight: total weight  Vital Signs: BP: 107/76 (07/26 2230) Pulse Rate: 89 (07/26 2230)  Labs: Recent Labs    08/10/22 1142 08/10/22 1649 08/10/22 1831 08/10/22 1835 08/11/22 0344 08/11/22 1100 08/11/22 1416 08/11/22 2230  HGB 12.3*  --    < > 13.3  12.2* 12.0*  --  11.9*  --   HCT 38.0*  --    < > 39.0  36.0* 35.9*  --  35.9*  --   PLT 217  --   --   --  202  --  181  --   APTT  --   --   --   --   --  48*  --  75*  LABPROT  --   --   --   --   --  19.7*  --   --   INR  --   --   --   --   --  1.7*  --   --   HEPARINUNFRC  --   --   --   --   --  >1.10*  --   --   CREATININE 1.56*  --   --   --  1.35*  --   --   --   TROPONINIHS 103* 90*  --   --   --   --   --   --    < > = values in this interval not displayed.    Estimated Creatinine Clearance: 55.1 mL/min (A) (by C-G formula based on SCr of 1.35 mg/dL (H)).  Assessment: 57 y.o. male with h/o Afib, Eliquis on hold, for heparin  Goal of Therapy:  aPTT 66-102 sec Heparin level 0.3-0.7 units/ml Monitor platelets by anticoagulation protocol: Yes   Plan:  Continue Heparin at current rate  Follow-up am labs.  Geannie Risen, PharmD, BCPS

## 2022-08-11 NOTE — Assessment & Plan Note (Signed)
Continue telemetry with V pacing.  Anticoagulation with heparin IV for now.

## 2022-08-11 NOTE — Assessment & Plan Note (Signed)
Continue blood pressure support with milrinone.

## 2022-08-11 NOTE — Assessment & Plan Note (Signed)
Continue with methotrexate.

## 2022-08-11 NOTE — Assessment & Plan Note (Signed)
Continue iron supplementation

## 2022-08-11 NOTE — Hospital Course (Addendum)
Mr. Bretl was admitted to the hospital with the working diagnosis of heart failure decompensation.   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 For the last 12 days he developed worsening dyspnea, generalized fatigue and weakness. He was instructed to increase his furosemide dose to bid, with no significant improvement in his symptoms. On the day of admission he was evaluated at the heart failure outpatient clinic, he was found with worsening heart failure and was referred to the ED for further evaluation.  On his initial physical examination his blood pressure was 116/84, HR 87, RR 18 and 02 saturation 94%, positive JVD, lungs with bilateral rales, and increased work of breathing, heart with S1 and S2 present and regular, with no gallops or murmurs, abdomen with no distention and positive lower extremity edema ++.   Chest radiograph with cardiomegaly, bilateral hilar vascular congestion with cephalization of the vasculature, bilateral interstitial infiltrates, symmetric and central. No pleural effusions. Pace maker defibrillator in place with one right atrial lead, and biventricular leads.   EKG 79 bpm, left axis deviation, left anterior fascicular block, right bundle branch block, ventricular paced rhythm with no significant ST segment or T wave changes.   Patient was placed on IV furosemide for diuresis. 07/25 right heart catheterization, with low cardiac output and high filling pressures, consistent with cardiogenic shock.  Started on inotropic support, milrinone and norepinephrine infusions.  07/26 consulted CT surgery for possible LVAT.   07/27 discontinued furosemide drip.  Change level of care to progressive.

## 2022-08-11 NOTE — TOC Initial Note (Signed)
Transition of Care [facility]) - Initial/Assessment Note    Patient Details  Name: Charles Holmes MRN: [id number] Date of Birth: 1965/12/12  Transition of Care [facility]) CM/SW Contact:    Elliot Cousin, RN Phone Number: [phone] 08/11/2022, 5:33 PM  Clinical Narrative:   HF TOC CM spoke to pt at bedside. Pt lives at home alone. Pt is being evaluated for LVAD. Will continue to follow for dc needs.                 Expected Discharge Plan: IP Rehab Facility Barriers to Discharge: Continued Medical Work up   Patient Goals and CMS Choice Patient states their goals for this hospitalization and ongoing recovery are:: wants to work on advance therapy for his condition          Expected Discharge Plan and Services   Discharge Planning Services: CM Consult   Living arrangements for the past 2 months: Single Family Home                                      Prior Living Arrangements/Services Living arrangements for the past 2 months: Single Family Home Lives with:: Self Patient language and need for interpreter reviewed:: Yes        Need for Family Participation in Patient Care: No (Comment) Care giver support system in place?: No (comment) Current home services: DME (scale) Criminal Activity/Legal Involvement Pertinent to Current Situation/Hospitalization: No - Comment as needed  Activities of Daily Living      Permission Sought/Granted Permission sought to share information with : Family Supports, Case Manager Permission granted to share information with : Yes, Verbal Permission Granted  Share Information with NAME: [name]     Permission granted to share info w Relationship: daughter  Permission granted to share info w Contact Information: [phone]  Emotional Assessment Appearance:: Appears stated age Attitude/Demeanor/Rapport: Engaged Affect (typically observed): Accepting Orientation: : Oriented to Self, Oriented to Place, Oriented to  Time,  Oriented to Situation   Psych Involvement: No (comment)  Admission diagnosis:  Acute on chronic congestive heart failure, unspecified heart failure type (HCC) [I50.9] CHF (congestive heart failure) (HCC) [I50.9] Patient Active Problem List   Diagnosis Date Noted   History of CVA (cerebrovascular accident) 08/11/2022   Paroxysmal atrial fibrillation (HCC) 08/11/2022   Iron deficiency anemia 08/11/2022   CHF (congestive heart failure) (HCC) 08/10/2022   Stroke (cerebrum) (HCC) 07/11/2022   Status post surgery 07/10/2022   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   Coronary artery disease involving native coronary artery of native heart without angina pectoris 03/02/2020   Chronic systolic heart failure (HCC) 02/21/2020   Ventricular tachycardia (HCC) 02/19/2020   Ventricular tachycardia, sustained (HCC) 02/19/2020   AKI (acute kidney injury) (HCC) 12/19/2019   CAD (coronary artery disease) 12/19/2019   Hyperlipidemia with target LDL less than 70 12/19/2019   Wide-complex tachycardia 12/16/2019   Acute on chronic systolic CHF (congestive heart failure) (HCC) 12/16/2019   HTN (hypertension) 12/16/2019   PCP:  [name], PA-C Pharmacy:   [facility] [address] [place] [address] Phone: [phone] Fax: [phone]  [id number] - [place], [place] - [address] AT [facility] [address] [place] [address] Phone: [phone] Fax: [phone]     Social Determinants of Health (SDOH) Social History: SDOH Screenings   Food Insecurity: No Food Insecurity (07/12/2022)  Housing: Low Risk  (07/12/2022)  Transportation Needs: No Transportation Needs (07/12/2022)  Utilities: Not At Risk (07/12/2022)  Financial Resource Strain: Low Risk  (07/12/2022)  Tobacco Use: Medium Risk (08/10/2022)   SDOH Interventions:     Readmission Risk Interventions     No data to display

## 2022-08-11 NOTE — Assessment & Plan Note (Signed)
Coronary angiography with proximal Cx 20 % stenosis, mid LAD 50% stenosis. Continue with atorvastatin.

## 2022-08-11 NOTE — Progress Notes (Signed)
ANTICOAGULATION CONSULT NOTE - Initial Consult  Pharmacy Consult for Heparin (Eliquis on hold) Indication: atrial fibrillation  Allergies  Allergen Reactions   Amiodarone     Severe tremors   Percocet [Oxycodone-Acetaminophen] Itching    Patient Measurements: Height: 5\' 7"  (170.2 cm) Weight: 75.3 kg (166 lb 0.1 oz) IBW/kg (Calculated) : 66.1 Heparin Dosing Weight: total weight  Vital Signs: Temp: 98 F (36.7 C) (07/26 1108) Temp Source: Oral (07/26 1108) BP: 109/77 (07/26 1400) Pulse Rate: 81 (07/26 1400)  Labs: Recent Labs    08/10/22 1142 08/10/22 1649 08/10/22 1831 08/10/22 1835 08/11/22 0344 08/11/22 1100 08/11/22 1416  HGB 12.3*  --    < > 13.3  12.2* 12.0*  --  11.9*  HCT 38.0*  --    < > 39.0  36.0* 35.9*  --  35.9*  PLT 217  --   --   --  202  --  181  APTT  --   --   --   --   --  48*  --   LABPROT  --   --   --   --   --  19.7*  --   INR  --   --   --   --   --  1.7*  --   HEPARINUNFRC  --   --   --   --   --  >1.10*  --   CREATININE 1.56*  --   --   --  1.35*  --   --   TROPONINIHS 103* 90*  --   --   --   --   --    < > = values in this interval not displayed.    Estimated Creatinine Clearance: 55.1 mL/min (A) (by C-G formula based on SCr of 1.35 mg/dL (H)).   Medical History: Past Medical History:  Diagnosis Date   CAD (coronary artery disease)    CHF (congestive heart failure) (HCC)    GERD (gastroesophageal reflux disease)    Hyperlipidemia    Hypertension    Systolic heart failure (HCC) 2021   LVEF 18%, RVEF 38% on cardiac MRI 12/19/2019. possible cardiac sarcoidosis.   Wide-complex tachycardia 2021   wears LifeVest    Medications:  Scheduled:   atorvastatin  80 mg Oral Daily   Chlorhexidine Gluconate Cloth  6 each Topical Daily   digoxin  0.125 mg Oral Daily   dorzolamide-timolol  1 drop Both Eyes BID   folic acid  1 mg Oral Daily   latanoprost  1 drop Both Eyes QHS   [START ON 08/12/2022] methotrexate  20 mg Oral Weekly    mexiletine  250 mg Oral BID   mometasone-formoterol  2 puff Inhalation BID   pantoprazole  40 mg Oral QAC breakfast   sodium chloride flush  10-40 mL Intracatheter Q12H   sodium chloride flush  3 mL Intravenous Q12H   sodium chloride flush  3 mL Intravenous Q12H   Infusions:   sodium chloride Stopped (08/11/22 0622)   sodium chloride 10 mL/hr at 08/11/22 1400   furosemide (LASIX) 200 mg in dextrose 5 % 100 mL (2 mg/mL) infusion 10 mg/hr (08/11/22 1400)   heparin 1,200 Units/hr (08/11/22 1400)   milrinone 0.25 mcg/kg/min (08/11/22 1400)   norepinephrine (LEVOPHED) Adult infusion 2 mcg/min (08/11/22 1400)   PRN: sodium chloride, sodium chloride, acetaminophen, ondansetron (ZOFRAN) IV, mouth rinse, sodium chloride flush, sodium chloride flush, sodium chloride flush  Assessment: 57 yo male on apixaban for afib,  recent CVA felt to be cardioembolic. Presents for cath, work up for LVAD. Last dose of apixaban 7/25 at 07:30, Hgb 11.9, Plts WNL,   Pharmacy consulted to transition apixaban to heparin.  APTT below goal this AM at 48 seconds on heparin gtt at 1200 units/hr.   Goal of Therapy:  Heparin level 0.3-0.7 units/ml Monitor platelets by anticoagulation protocol: Yes   Plan:  Increase IV heparin to 1200 units/hr. Repeat aPTT in 8 hrs. Daily aPTT, heparin level and CBC.  Reece Leader, Colon Flattery, BCCP Clinical Pharmacist  08/11/2022 3:21 PM   [facility] pharmacy phone numbers are listed on [email or web address]

## 2022-08-11 NOTE — Assessment & Plan Note (Signed)
Patient on mexiletine.  Continue telemetry monitoring. Keep K at 4 and Mg at 2.

## 2022-08-11 NOTE — Assessment & Plan Note (Signed)
Hypokalemia.   Stable renal function with serum cr at 1,1 with K at 3,8 and serum bicarbonate at 32. Na 133.   Plan to continue hemodynamic support and diuresis. Continue renal function monitoring, bmp in am.

## 2022-08-11 NOTE — Progress Notes (Signed)
Heart Failure Navigator Progress Note  Assessed for Heart & Vascular TOC clinic readiness.  Patient does not meet criteria due to Advanced Heart Failure Team patient of Dr. Bensimhon.   Navigator will sign off at this time.   Dawn Fields, BSN, RN Heart Failure Nurse Navigator Secure Chat Only   

## 2022-08-11 NOTE — Progress Notes (Addendum)
Advanced Heart Failure Rounding Note  PCP-Cardiologist: Norman Herrlich, MD   Subjective:   Admitted with A/C HFrEF. Taken to cath lab. Elevated filling pressures + cardiogenic shock, cardiac index 1.4. Started on milrinone + NE. Given 80 mg IV lasix.    Norepi 2 mcg + Milrinone 0.25 mcg. CO-OX 57% CVP 10-11  Denies SOB Objective:   Weight Range: 75.3 kg Body mass index is 26 kg/m.   Vital Signs:   Temp:  [97.5 F (36.4 C)-98.4 F (36.9 C)] 97.6 F (36.4 C) (07/26 0300) Pulse Rate:  [0-95] 85 (07/26 0615) Resp:  [15-31] 21 (07/26 0615) BP: (65-124)/(40-90) 108/79 (07/26 0615) SpO2:  [86 %-99 %] 96 % (07/26 0615) Weight:  [75.3 kg-77.1 kg] 75.3 kg (07/26 0403)    Weight change: Filed Weights   08/10/22 1240 08/11/22 0403  Weight: 77.1 kg 75.3 kg    Intake/Output:   Intake/Output Summary (Last 24 hours) at 08/11/2022 0626 Last data filed at 08/11/2022 1610 Gross per 24 hour  Intake 1175.81 ml  Output 1700 ml  Net -524.19 ml    CVP in the chair 10-11  Physical Exam    General: Sitting in the chair.  No resp difficulty HEENT: Normal Neck: Supple. JVP 10-11 . Carotids 2+ bilat; no bruits. No lymphadenopathy or thyromegaly appreciated. RIJ Cor: PMI nondisplaced. Regular rate & rhythm. No rubs, gallops or murmurs. Lungs: Clear Abdomen: Soft, nontender, nondistended. No hepatosplenomegaly. No bruits or masses. Good bowel sounds. Extremities: No cyanosis, clubbing, rash, R and LLE 1-2edema Neuro: Alert & orientedx3, cranial nerves grossly intact. moves all 4 extremities w/o difficulty. Affect pleasant   Telemetry   V paced 80s   EKG    N/A   Labs    CBC Recent Labs    08/10/22 1142 08/10/22 1831 08/10/22 1835 08/11/22 0344  WBC 10.2  --   --  9.1  HGB 12.3*   < > 13.3  12.2* 12.0*  HCT 38.0*   < > 39.0  36.0* 35.9*  MCV 103.0*  --   --  105.0*  PLT 217  --   --  202   < > = values in this interval not displayed.   Basic Metabolic  Panel Recent Labs    08/10/22 1142 08/10/22 1831 08/10/22 1835 08/10/22 2041 08/11/22 0344  NA 136   < > 136  138  --  137  K 3.3*   < > 3.4*  3.1*  --  3.3*  CL 91*  --   --   --  88*  CO2 33*  --   --   --  33*  GLUCOSE 110*  --   --   --  106*  BUN 29*  --   --   --  27*  CREATININE 1.56*  --   --   --  1.35*  CALCIUM 9.3  --   --   --  8.8*  MG  --   --   --  2.0 2.2  PHOS  --   --   --  4.4  --    < > = values in this interval not displayed.   Liver Function Tests Recent Labs    08/10/22 1142  AST 61*  ALT 77*  ALKPHOS 77  BILITOT 3.4*  PROT 6.9  ALBUMIN 3.8   No results for input(s): "LIPASE", "AMYLASE" in the last 72 hours. Cardiac Enzymes No results for input(s): "CKTOTAL", "CKMB", "CKMBINDEX", "TROPONINI" in the last 72  hours.  BNP: BNP (last 3 results) Recent Labs    07/11/22 0209 07/26/22 1211 08/10/22 1142  BNP 1,588.2* 1,723.6* 1,905.0*    ProBNP (last 3 results) No results for input(s): "PROBNP" in the last 8760 hours.   D-Dimer No results for input(s): "DDIMER" in the last 72 hours. Hemoglobin A1C No results for input(s): "HGBA1C" in the last 72 hours. Fasting Lipid Panel No results for input(s): "CHOL", "HDL", "LDLCALC", "TRIG", "CHOLHDL", "LDLDIRECT" in the last 72 hours. Thyroid Function Tests No results for input(s): "TSH", "T4TOTAL", "T3FREE", "THYROIDAB" in the last 72 hours.  Invalid input(s): "FREET3"  Other results:   Imaging    DG CHEST PORT 1 VIEW  Result Date: 08/10/2022 CLINICAL DATA:  Central line placement. EXAM: PORTABLE CHEST 1 VIEW COMPARISON:  Chest radiograph dated 08/10/2022. FINDINGS: Interval placement of a right IJ central venous line with tip at the cavoatrial junction. No pneumothorax. Stable cardiomediastinal silhouette. Left pectoral AICD device. No acute osseous pathology. IMPRESSION: Right IJ central venous line with tip at the cavoatrial junction. No pneumothorax. Electronically Signed   By: Elgie Collard M.D.   On: 08/10/2022 20:42   CARDIAC CATHETERIZATION  Result Date: 08/10/2022   Prox Cx lesion is 20% stenosed.   Mid LAD lesion is 50% stenosed.   The left ventricular ejection fraction is less than 25% by visual estimate. Findings: Ao = 98/73 (79) LV = 101/39 RA = 19 RV = 76/21 PA = 80/42 (57) PCW = 47 (v = 55) Fick cardiac output/index = 2.7/1.4 PVR = 3.0 WU Ao sat = 96% PA sat = 41%, 39% PAPi = 2.0 Assessment: 1. Mild non-obstructive CAD 2. Severe NICM with severely elevated filling pressures and low output state consistent with cardiogenic shock Plan/Discussion: Transfer to ICU. Start milrinone and NE for support. Diurese. Will need to evaluate for LVAD candidacy. (PFTs have been a barrier in past) Arvilla Meres, MD 7:29 PM  DG Chest 2 View  Result Date: 08/10/2022 CLINICAL DATA:  CHF EXAM: CHEST - 2 VIEW COMPARISON:  07/11/2022 and older FINDINGS: Enlarged cardiopericardial silhouette. Hyperinflation with chronic lung changes. No consolidation, pneumothorax. Possible interstitial edema. No effusion. Left upper chest defibrillator. Old right-sided rib fractures. Degenerative changes of the spine. There is compression deformity of the lower thoracic vertebral level with sclerotic margins. Favor a chronic process. IMPRESSION: Hyperinflation with chronic changes. Enlarged heart with defibrillator. Trace interstitial changes with Charyl Dancer B-lines Electronically Signed   By: Karen Kays M.D.   On: 08/10/2022 14:05   [place] EKG SITE RITE  Result Date: 08/10/2022 If Site Rite image not attached, placement could not be confirmed due to current cardiac rhythm.    Medications:     Scheduled Medications:  atorvastatin  80 mg Oral Daily   Chlorhexidine Gluconate Cloth  6 each Topical Daily   digoxin  0.125 mg Oral Daily   dorzolamide-timolol  1 drop Both Eyes BID   furosemide  80 mg Intravenous BID   latanoprost  1 drop Both Eyes QHS   mexiletine  250 mg Oral BID   mometasone-formoterol   2 puff Inhalation BID   pantoprazole  40 mg Oral QAC breakfast   sodium chloride flush  10-40 mL Intracatheter Q12H   sodium chloride flush  3 mL Intravenous Q12H   sodium chloride flush  3 mL Intravenous Q12H    Infusions:  sodium chloride Stopped (08/11/22 0622)   sodium chloride 10 mL/hr at 08/11/22 0625   heparin 1,000 Units/hr (08/11/22 0625)  milrinone 0.25 mcg/kg/min (08/11/22 0625)   norepinephrine (LEVOPHED) Adult infusion 2 mcg/min (08/11/22 8295)   potassium chloride 50 mL/hr at 08/11/22 0625    PRN Medications: sodium chloride, sodium chloride, acetaminophen, ondansetron (ZOFRAN) IV, mouth rinse, sodium chloride flush, sodium chloride flush, sodium chloride flush    Patient Profile   Charles Holmes is a 57 y.o. 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   Assessment/Plan  1.  Acute on chronic Systolic HF-->Cardiogenic Shock  - Diagnosed 11/2019. Presented with VT. LHC 70% LAD  -- cMRI 12/21 concerning for sarcoid and EF 18%.  - PET 2/22 at [facility] EF 25% + active sarcoid - Echo 08/26/20 EF < 20% severely dilated LV RV mildly decreased.  - CPX 8/22. Unable to complete PFTs due to hypoventilation and poor effort. pVO2 16.1 (49% predicted). Slope 19 RER: 1.17 PETCO2 59 - Med[employer] CRT-D upgrade in 06/08/21 - Echo 11/07/21: EF 20-25% RV mild to moderately reduced.  - Echo 07/10/22: EF <20%, RV okay, mod pericardial effusion, mod Charles/TR - RHC 6/28 showed mean RA 9, PA 58/26, mean PCWP 30, CI 2.6>>diuresed w/ IV Lasix  Optivol - Fluid index trending up and thoracic impedance down. Activity trending down to 0.5 hours.  - On admit taken to the cath lab. Elevated filling pressures and lowoutput.  - CVP trending down but remains volume overloaded. Start lasix drip @ 10 mg per hour. Supp K.  - CO-OX 57% on Norepi 2 + Milrinone 0.25 mcg. Continue  - Continue digoxin 0.125 mcg daily.  - VAD coordinators aware, to see patient tomorrow and  start formal work up.    2.  Hx of stroke -Admitted with acute L sided weakness 6/24, CTA revealed an acute right M1 occlusion. S/p TPA and mechanical clot extraction. No residual deficits  -Likely cardioembolic in setting of severe LV dysfunction. 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 EKG today A sensed- v paced w/ underlying rhythm sinus.  - Off eliquis  - Continue heparin drip until all procedures completed.  - VAD coordinators to see today. Start VAD work up.    3. 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 Continue mexilitene  - now s/p ICD. No VT on interrogation.    4. CAD - LHC 12/07/19 70-% LAD, no intervention - LHC mild non obstructive CAD.  - Continue statin and aspirin.    5. Cardiac sarcoid - PET 2/22 at [facility] EF 25% + active sarcoid - Has completed prednisone.  - Takes MTX 20 weekly, Saturday. Restart folic acid.  - Previously discussed EM biopsy but patient is reluctant  - Needs repeat PET, wasn't able to schedule 10/23 as insurance would not cover. Now has medicaid and medicare. 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  - on Eliquis for recent CVA, will switch to heparin gtt   7. AKI - Creatinine baseline around 1.1 - Trending down with inotropes. 1.35 - suspect cardiorenal - monitor with diuresis   8. Iron deficiency anemia - recent T sat 15%, scheduled for OP feraheme tomorrow. Will complete inpatient after workup complete.      Length of Stay: 1  Amy Clegg, NP  08/11/2022, 6:26 AM  Advanced Heart Failure Team Pager [phone] (M-F; 7a - 5p)  Please contact [facility] for night-coverage after hours (5p -7a ) and weekends on [email or web address]  Agree with  above.   Remains very tenuous. On milrinone and NE 2. CVP still up. Feels weak. SOB improving. Did not diurese significantly overnight.   General:  Sitting up. weak appearing. No resp difficulty HEENT: normal Neck:  supple. RIJ TLC  Carotids 2+ bilat; no bruits. No lymphadenopathy or thryomegaly appreciated. Cor: Regular rate & rhythm. No rubs, gallops or murmurs. Lungs: coarse Abdomen: soft, nontender, nondistended. No hepatosplenomegaly. No bruits or masses. Good bowel sounds. Extremities: no cyanosis, clubbing, rash, 2+ edema Neuro: alert & orientedx3, cranial nerves grossly intact. moves all 4 extremities w/o difficulty. Affect pleasant  He has advanced HF now on milrinone and NE. Wil titrate as needed. Needs more diuresis.   Will have VAD team see today for potential VAD. (PFTs had been a barrier in past) will need to repeat once diuresed   CRITICAL CARE Performed by: Arvilla Meres  Total critical care time: 40 minutes  Critical care time was exclusive of separately billable procedures and treating other patients.  Critical care was necessary to treat or prevent imminent or life-threatening deterioration.  Critical care was time spent personally by me (independent of midlevel providers or residents) on the following activities: development of treatment plan with patient and/or surrogate as well as nursing, discussions with consultants, evaluation of patient's response to treatment, examination of patient, obtaining history from patient or surrogate, ordering and performing treatments and interventions, ordering and review of laboratory studies, ordering and review of radiographic studies, pulse oximetry and re-evaluation of patient's condition.  Arvilla Meres, MD  6:49 AM

## 2022-08-11 NOTE — Progress Notes (Addendum)
[facility] EDUCATION NOTE:                VAD evaluation consent reviewed and signed by [name].  Initial VAD teaching completed with pt.   VAD educational packet including "Understanding Your Options with Advanced Heart Failure", "[place] Patient Agreement for VAD Evaluation and Potential Implantation" consent, and [employer] "Heartmate 3 Left Ventricular Device (LVAD) Patient Guide", "[place] HM III Patient Education", "[place] Mechanical Circulatory Support Program", and "Decision Aids for Left Ventricular Assist Device" reviewed in detail and left at bedside for continued reference.   All questions answered regarding VAD implant, hospital stay, and what to expect when discharged home living with a heart pump.   He is unsure if he has anyone to serve as caregiver. Shared that his sister was recently discharged from the hospital following a stroke. She is the primary caregiver for their 59 year old father. His daughter and son-in-law live in town, and they are excepting the birth of their second child soon. He is going to think about who he could talk to about potentially serving as caregiver for him.  Explained need for 24/7 care when pt is discharged home due to sternal precautions, adaptation to living on support, emotional support, consistent and meticulous exit site care and management, medication adherence and high volume of follow up visits with the VAD Clinic after discharge; both pt and caregiver verbalized understanding of above.   Explained that LVAD can be implanted for two indications in the setting of advanced left ventricular heart failure treatment:  Bridge to transplant - used for patients who cannot safely wait for heart transplant without this device.  Or    Destination therapy - used for patients until end of life or recovery of heart function.  Patient and caregiver(s) acknowledge that the indication at this point in time for LVAD therapy would be for destination therapy  due to sarcoid heart disease.   Provided brief equipment overview and demonstration with HeartMate III training loop including discussion on the following:   a) mobile power unit b) system controller   c) universal [employer] features editor   d) battery clips   e) Batteries   f)  Perc lock   g) Percutaneous lead   Reviewed and supplied a copy of home inspection check list stressing that only three pronged grounded power outlets can be used for VAD equipment. Hayyan confirmed home has electrical outlets that will support the equipment along with access working telephone.  Identified the following lifestyle modifications while living on MCS:    1. No driving for at least three months and then only if doctor gives permission to do so.   2. No tub baths while pump implanted, and shower only when doctor gives permission.   3. No swimming or submersion in water while implanted with pump.   4. No contact sports or engaging in jumping activities.   5. Always have a backup controller, charged spare batteries, and battery clips nearby at all times in case of emergency.   6. Call the doctor or hospital contact person if any change in how the pump sounds, feels, or works.   7. Plan to sleep only when connected to the power module.   8. Do not sleep on your stomach.   9. Keep a backup system controller, charged batteries, battery clips, and flashlight near you during sleep in case of electrical power outage.   10. Exit site care including dressing changes, monitoring for infection, and  importance of keeping percutaneous lead stabilized at all times.     Extended the option to have one of our current patients and caregiver(s) come to talk with them about living on support to assist with decision making.   He will also need to abide by sternal precautions with no lifting >10lbs, pushing, pulling and will need assistance with adapting to new life style with VAD equipment and care   Reviewed pictures of VAD  drive line, site care, dressing changes, and drive line stabilization including securement attachment device and abdominal binder. Discussed with pt and family that they will be required to purchase dressing supplies as long as patient has the VAD in place.   Intermacs patient survival statistics through March 2024 reviewed with patient and caregiver as follows:    The patient understands that from this discussion it does not mean that they will receive the device, but that depends on an extensive evaluation process. The patient is aware of the fact that if at anytime he wants to stop the evaluation process he can.  All questions have been answered at this time and contact information was provided should he encounter any further questions. He is agreeable at this time to the evaluation process and will move forward.   All evaluation testing has been ordered. Will plan to discuss patient at [facility] once all evaluation testing completed.   Total Session Time: 45 minutes  [name] RN VAD Coordinator  Office: [phone]  24/7 Pager: [phone]

## 2022-08-11 NOTE — Assessment & Plan Note (Signed)
Continue with statin therapy.  Patient on heparin IV for anticoagulation in preparation for possible invasive cardiac intervention.

## 2022-08-11 NOTE — Progress Notes (Signed)
Progress Note   Patient: JOHNOTHAN Holmes GUY:[id number] DOB: [birth year] DOA: 08/10/2022     1 DOS: the patient was seen and examined on 08/11/2022   Brief hospital course: Mr. Pask was admitted to the hospital with the working diagnosis of heart failure decompensation.   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 For the last 12 days he developed worsening dyspnea, generalized fatigue and weakness. He was instructed to increase his furosemide dose to bid, with no significant improvement in his symptoms. On the day of admission he was evaluated at the heart failure outpatient clinic, he was found with worsening heart failure and was referred to the ED for further evaluation.  On his initial physical examination his blood pressure was 116/84, HR 87, RR 18 and 02 saturation 94%, positive JVD, lungs with bilateral rales, and increased work of breathing, heart with S1 and S2 present and regular, with no gallops or murmurs, abdomen with no distention and positive lower extremity edema ++.   Chest radiograph with cardiomegaly, bilateral hilar vascular congestion with cephalization of the vasculature, bilateral interstitial infiltrates, symmetric and central. No pleural effusions. Pace maker defibrillator in place with one right atrial lead, and biventricular leads.   EKG 79 bpm, left axis deviation, left anterior fascicular block, right bundle branch block, ventricular paced rhythm with no significant ST segment or T wave changes.   Patient was placed on IV furosemide for diuresis. 07/25 right heart catheterization, with low cardiac output and high filling pressures, consistent with cardiogenic shock.  Started on inotropic support, milrinone and norepinephrine  infusions.  07/26 consulted CT surgery for possible LVAT.      Assessment and Plan: * Acute on chronic systolic CHF (congestive heart failure) (HCC) Echocardiogram with reduced LV systolic function <20%, severe dilatation of LV cavity, no LVH, LV inferior wall septum akinetic, hypokinetic entire anterior wall, entire lateral wall, anterior septum, entire inferior wall, and apex. RV systolic function preserved, RVSP 59,0 mmHg.  LA with mild dilatation, moderate pericardial effusion, localized near the right atrium and anterior to the right ventricle with no signs of tamponade, moderate mitral valve regurgitation, moderate TR.   07/25 cardiac catheterization  PA 80/42 mean 57 PCWP 47 Cardiac output 2.7 and index 1,4 (Fick). PVR 3,0 WU  Cardiogenic shock.  Pre and post capillary pulmonary hypertension.   Urine output 1,700 ml Systolic blood pressure 97 to 563 mmHg.   Vasopressor and inotropic support with milrinone and norepinephrine.  Continue digoxin.  Furosemide drip 10 mg per hr.   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  Consultation for possible LVAT.  Follow up with CT surgery recommendations.   CAD (coronary artery disease) Coronary angiography with proximal Cx 20 % stenosis, mid LAD 50% stenosis. Continue with atorvastatin.     HTN (hypertension) Continue blood pressure support with milrinone and norepinephrine.   Ventricular tachycardia (HCC) Patient on mexiletine.  Continue telemetry monitoring. Keep K at 4 and Mg at 2.   Cardiac sarcoidosis Continue with methotrexate.   AKI (acute kidney injury) (HCC) Hypokalemia.   Renal function with serum cr at 1,35 with K at 3,3 and serum bicarbonate at 33. Na 137,  Plan to continue hemodynamic support and diuresis. Follow up renal function in am.   History of CVA (cerebrovascular accident) Continue with statin therapy.  Patient on heparin IV for anticoagulation in preparation for possible invasive  cardiac intervention.   Paroxysmal atrial fibrillation (HCC) Continue telemetry monitoring.  Anticoagulation with heparin IV for now.   Iron deficiency anemia Continue iron supplementation.         Subjective: Patient is feeling very deconditioned and weak, no dyspnea or chest pain.   Physical Exam: Vitals:   08/11/22 1200 08/11/22 1300 08/11/22 1400 08/11/22 1500  BP: 100/79 104/68 109/77 105/65  Pulse: 84 84 81 85  Resp: (!) 21 20 (!) 22 (!) 21  Temp:      TempSrc:      SpO2: 92% (!) 89% 96% 96%  Weight:      Height:       Neurology awake and alert ENT with mild pallor Cardiovascular with S1 and S2 present and regular, positive systolic murmur at the right lower sternal border. Moderate JVD Positive lower extremity edema ++, cold lower extremities Respiratory with mild rales at bases with no wheezing Abdomen with no distention  Data Reviewed:    Family Communication: no family at the bedside  Disposition: Status is: Inpatient Remains inpatient appropriate because: heart failure with low output on inotropic and vasopressor support.   Planned Discharge Destination: Home    Patient critically ill with cardiogenic shock and requiring IV inotropic and vasopressor therapy. IV diuresis   Time spent: 45 minutes   Author: Coralie Keens, MD 08/11/2022 4:34 PM  For on call review [email or web address].

## 2022-08-11 NOTE — Assessment & Plan Note (Addendum)
Echocardiogram with reduced LV systolic function <20%, severe dilatation of LV cavity, no LVH, LV inferior wall septum akinetic, hypokinetic entire anterior wall, entire lateral wall, anterior septum, entire inferior wall, and apex. RV systolic function preserved, RVSP 59,0 mmHg.  LA with mild dilatation, moderate pericardial effusion, localized near the right atrium and anterior to the right ventricle with no signs of tamponade, moderate mitral valve regurgitation, moderate TR.   07/25 cardiac catheterization  PA 80/42 mean 57 PCWP 47 Cardiac output 2.7 and index 1,4 (Fick). PVR 3,0 WU  Recovering cardiogenic shock.  Pre and post capillary pulmonary hypertension.   Urine output 1,725  ml Systolic blood pressure 98 to 161  mmHg.   Off Norepinephrine  On milrinone 0,25 mcg/kg/min  Continue digoxin.  Furosemide oral 40 mg.   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  Pending PFT in preparation for possible LVAT.

## 2022-08-11 NOTE — Consult Note (Signed)
[address]            [place] [address]          [phone]       JAYCEK ZUBAIR [facility] Medical Record #[id number] Date of Birth: 1965/05/17  No ref. provider found [name], PA-C  Chief Complaint:    Chief Complaint  Patient presents with   Fatigue   Weakness  Patient examined, images of recent echocardiogram, chest x-ray, and cardiac catheterization personally reviewed and counseled with patient.  I was asked to evaluate the patient for possible implantable VAD support.  History of Present Illness:     57 year old nondiabetic smoker with history of cardiac and pulmonary sarcoidosis recently rehospitalized with low cardiac output class IV heart failure 3 to 4 weeks after sustaining  an embolic stroke with occlusion of the right middle cerebral artery open with mechanical thrombectomy by neuroradiology.  He has had longstanding heart failure with EF less than 20% for the past 3 years and is status post AICD BiV pacemaker placement without previous shock.  He presented with weakness malaise poor appetite weight loss and very limited exercise tolerance.  Echo showed EF 18% with LV dilatation and moderate RV dysfunction.  No significant AI or TR.  I heart cath demonstrated low cardiac output with papi 3.0.  He was placed on milrinone and norepinephrine with improvement in cardiac output and symptoms.  The norepinephrine was weaned off.  His appetite and nutritional intake remarkably better. Renal function has remained satisfactory.  LFTs and bilirubin have been elevated and remained abnormal  The patient feels he has recovered from his stroke..  Patient has been on prednisone in the past but not for the past few years because of weight gain and joint problems.  He has been on methotrexate for cardiac sarcoidosis after PET scan showed evidence of myocardial inflammation at [facility].  He has been on Eliquis since his stroke without bleeding  complications.  He has longstanding poor dentition with few remaining teeth at least 1 of which is loose.  Patient has had no major operations of the AICD pacemaker implantation.  No history of thoracic trauma.  He has had previous PFTs 3 years ago showing severe restrictive lung disease with FEV1 of 1000 cc.  Diffusion capacity was only moderately reduced.  He takes inhaler therapy and has not smoked for several years.  Patient has repeat CT scans of the chest and abdomen and PFTs pending.   Current Activity/ Functional Status: Patient lives alone with poor functional status . Barely able to drive himself to [employer] to run errands without giving out.   Past Medical History:  Diagnosis Date   CAD (coronary artery disease)    CHF (congestive heart failure) (HCC)    GERD (gastroesophageal reflux disease)    Hyperlipidemia    Hypertension    Systolic heart failure (HCC) 2021   LVEF 18%, RVEF 38% on cardiac MRI 12/19/2019. possible cardiac sarcoidosis.   Wide-complex tachycardia 2021   wears LifeVest    Past Surgical History:  Procedure Laterality Date   BIV UPGRADE N/A 06/07/2021   Procedure: BIV ICD UPGRADE;  Surgeon: Regan Lemming, MD;  Location: [facility] INVASIVE CV LAB;  Service: Cardiovascular;  Laterality: N/A;   ICD IMPLANT N/A 02/20/2020   Procedure: ICD IMPLANT;  Surgeon: Regan Lemming, MD;  Location: [facility] INVASIVE CV LAB;  Service: Cardiovascular;  Laterality: N/A;   IR CT HEAD LTD  07/10/2022   IR PERCUTANEOUS ART THROMBECTOMY/INFUSION INTRACRANIAL INC DIAG ANGIO  07/10/2022   IR US GUIDE VASC ACCESS RIGHT  07/10/2022   RADIOLOGY WITH ANESTHESIA N/A 07/10/2022   Procedure: RADIOLOGY WITH ANESTHESIA;  Surgeon: Radiologist, Medication, MD;  Location: [facility] OR;  Service: Radiology;  Laterality: N/A;   RIGHT HEART CATH N/A 07/14/2022   Procedure: RIGHT HEART CATH;  Surgeon: Dolores Patty, MD;  Location: [facility] INVASIVE CV LAB;  Service: Cardiovascular;  Laterality: N/A;    RIGHT/LEFT HEART CATH AND CORONARY ANGIOGRAPHY N/A 12/16/2019   Procedure: RIGHT/LEFT HEART CATH AND CORONARY ANGIOGRAPHY;  Surgeon: [place], Celestine Bougie M, MD;  Location: [facility] INVASIVE CV LAB;  Service: Cardiovascular;  Laterality: N/A;   RIGHT/LEFT HEART CATH AND CORONARY ANGIOGRAPHY N/A 08/10/2022   Procedure: RIGHT/LEFT HEART CATH AND CORONARY ANGIOGRAPHY;  Surgeon: Dolores Patty, MD;  Location: [facility] INVASIVE CV LAB;  Service: Cardiovascular;  Laterality: N/A;    Social History   Tobacco Use  Smoking Status Former   Types: Cigarettes  Smokeless Tobacco Never    Social History   Substance and Sexual Activity  Alcohol Use Yes   Alcohol/week: 6.0 standard drinks of alcohol   Types: 6 Cans of beer per week    Social History   Socioeconomic History   Marital status: Divorced    Spouse name: Not on file   Number of children: Not on file   Years of education: Not on file   Highest education level: Not on file  Occupational History   Not on file  Tobacco Use   Smoking status: Former    Types: Cigarettes   Smokeless tobacco: Never  Vaping Use   Vaping status: Unknown  Substance and Sexual Activity   Alcohol use: Yes    Alcohol/week: 6.0 standard drinks of alcohol    Types: 6 Cans of beer per week   Drug use: Yes    Types: Marijuana    Comment: stopped using months ago d/t how it affected breathing   Sexual activity: Yes  Other Topics Concern   Not on file  Social History Narrative   Not on file   Social Determinants of Health   Financial Resource Strain: Low Risk  (07/12/2022)   Overall Financial Resource Strain (CARDIA)    Difficulty of Paying Living Expenses: Not hard at all  Food Insecurity: No Food Insecurity (07/12/2022)   Hunger Vital Sign    Worried About Running Out of Food in the Last Year: Never true    Ran Out of Food in the Last Year: Never true  Transportation Needs: No Transportation Needs (07/12/2022)   PRAPARE - Administrator, Civil Service  (Medical): No    Lack of Transportation (Non-Medical): No  Physical Activity: Not on file  Stress: Not on file  Social Connections: Not on file  Intimate Partner Violence: Not At Risk (07/13/2022)   Humiliation, Afraid, Rape, and Kick questionnaire    Fear of Current or Ex-Partner: No    Emotionally Abused: No    Physically Abused: No    Sexually Abused: No    Allergies  Allergen Reactions   Percocet [Oxycodone-Acetaminophen] Itching    Current Facility-Administered Medications  Medication Dose Route Frequency Provider Last Rate Last Admin   0.9 %  sodium chloride infusion  250 mL Intravenous PRN Bensimhon, Bevelyn Buckles, MD   Stopped at 08/11/22 0622   0.9 %  sodium chloride infusion  250 mL Intravenous PRN Bensimhon, Bevelyn Buckles, MD 10 mL/hr at 08/11/22 1000 Infusion Verify at 08/11/22 1000   acetaminophen (TYLENOL) tablet 650 mg  650 mg Oral Q4H PRN Bensimhon, Bevelyn Buckles, MD       atorvastatin (LIPITOR) tablet 80 mg  80 mg Oral Daily Bensimhon, Bevelyn Buckles, MD   80 mg at 08/11/22 1047   Chlorhexidine Gluconate Cloth 2 % PADS 6 each  6 each Topical Daily Bensimhon, Bevelyn Buckles, MD   6 each at 08/11/22 1048   digoxin (LANOXIN) tablet 0.125 mg  0.125 mg Oral Daily Bensimhon, Bevelyn Buckles, MD   0.125 mg at 08/11/22 1047   dorzolamide-timolol (COSOPT) 2-0.5 % ophthalmic solution 1 drop  1 drop Both Eyes BID Bensimhon, Bevelyn Buckles, MD       folic acid (FOLVITE) tablet 1 mg  1 mg Oral Daily Clegg, Amy D, NP   1 mg at 08/11/22 1047   furosemide (LASIX) 200 mg in dextrose 5 % 100 mL (2 mg/mL) infusion  10 mg/hr Intravenous Continuous Clegg, Amy D, NP 5 mL/hr at 08/11/22 1000 10 mg/hr at 08/11/22 1000   heparin ADULT infusion 100 units/mL (25000 units/240mL)  1,200 Units/hr Intravenous Continuous Carney, Gwenlyn Found, RPH 12 mL/hr at 08/11/22 1315 1,200 Units/hr at 08/11/22 1315   latanoprost (XALATAN) 0.005 % ophthalmic solution 1 drop  1 drop Both Eyes QHS Bensimhon, Bevelyn Buckles, MD       Melene Muller ON 08/12/2022]  methotrexate (RHEUMATREX) tablet 20 mg  20 mg Oral Weekly Clegg, Amy D, NP       mexiletine (MEXITIL) capsule 250 mg  250 mg Oral BID Bensimhon, Bevelyn Buckles, MD   250 mg at 08/11/22 1047   milrinone (PRIMACOR) 20 MG/100 ML (0.2 mg/mL) infusion  0.25 mcg/kg/min Intravenous Continuous Bensimhon, Bevelyn Buckles, MD 5.78 mL/hr at 08/11/22 1030 0.25 mcg/kg/min at 08/11/22 1030   mometasone-formoterol (DULERA) 200-5 MCG/ACT inhaler 2 puff  2 puff Inhalation BID Bensimhon, Bevelyn Buckles, MD   2 puff at 08/11/22 0830   norepinephrine (LEVOPHED) 16 mg in (0.064 mg/mL) premix infusion  0-40 mcg/min Intravenous Titrated Bensimhon, Bevelyn Buckles, MD 1.88 mL/hr at 08/11/22 1000 2 mcg/min at 08/11/22 1000   ondansetron (ZOFRAN) injection 4 mg  4 mg Intravenous Q6H PRN Bensimhon, Bevelyn Buckles, MD       Oral care mouth rinse  15 mL Mouth Rinse PRN Bensimhon, Bevelyn Buckles, MD       pantoprazole (PROTONIX) EC tablet 40 mg  40 mg Oral QAC breakfast Bensimhon, Bevelyn Buckles, MD   40 mg at 08/11/22 0831   sodium chloride flush (NS) 0.9 % injection 10-40 mL  10-40 mL Intracatheter Q12H Bensimhon, Bevelyn Buckles, MD   10 mL at 08/11/22 1047   sodium chloride flush (NS) 0.9 % injection 10-40 mL  10-40 mL Intracatheter PRN Bensimhon, Bevelyn Buckles, MD       sodium chloride flush (NS) 0.9 % injection 3 mL  3 mL Intravenous Q12H Bensimhon, Bevelyn Buckles, MD       sodium chloride flush (NS) 0.9 % injection 3 mL  3 mL Intravenous PRN Bensimhon, Bevelyn Buckles, MD       sodium chloride flush (NS) 0.9 % injection 3 mL  3 mL Intravenous Q12H Bensimhon, Bevelyn Buckles, MD   3 mL at 08/10/22 2149   sodium chloride flush (NS) 0.9 % injection 3 mL  3 mL Intravenous PRN Bensimhon, Bevelyn Buckles, MD         Family History  Problem Relation Age of Onset   Hypertension Father    Heart disease Father    Heart disease Mother      Review of Systems:     Cardiac Review of Systems: Y or N  Chest Pain [    ]  Resting SOB [ y  ] Exertional SOB  [ y ]  Orthopnea [  ]   Pedal Edema [   ]     Palpitations [  ] Syncope  [  ]   Presyncope [   ]  General Review of Systems: [Y] = yes [  ]=no Constitional: recent weight change [ y ]; anorexia [ y ]; fatigue [ y ]; nausea [ y ]; night sweats [  ]; fever [  ]; or chills [  ];                                                                                                                                          Dental: poor dentition[  y]; Last Dentist visit:> 1 yr  Eye : blurred vision [  ]; diplopia [   ]; vision changes [  ];  Amaurosis fugax[  ]; Resp: cough [ y ];  wheezing[  ];  hemoptysis[  ]; shortness of breath[ y ]; paroxysmal nocturnal dyspnea[  ]; dyspnea on exertion[  ]; or orthopnea[  ];  GI:  gallstones[  ], vomiting[  ];  dysphagia[  ]; melena[  ];  hematochezia [  ]; heartburn[  ];   Hx of  Colonoscopy[  ]; GU: kidney stones [  ]; hematuria[  ];   dysuria [  ];  nocturia[  ];  history of     obstruction [  ];                 Skin: rash, swelling[  ];, hair loss[  ];  peripheral edema[  ];  or itching[  ]; Musculosketetal: myalgias[  ];  joint swelling[  ];  joint erythema[  ];  joint pain[  ];  back pain[  ];  Heme/Lymph: bruising[  ];  bleeding[  ];  anemia[  ];  Neuro: TIA[  ];  headaches[  ];  stroke[y  ];  vertigo[  ];  seizures[  ];   paresthesias[  ];  difficulty walking[  ];  Psych:depression[  ]; anxiety[  ];  Endocrine: diabetes[  ];  thyroid dysfunction[  ];  Immunizations: Flu [  ]; Pneumococcal[  ];  Other:  Physical Exam: BP 103/71   Pulse 77   Temp 98 F (36.7 C) (Oral)   Resp (!) 21   Ht 5\' 7"  (1.702 m)   Wt 75.3 kg   SpO2 92%   BMI 26.00 kg/m       Physical Exam  General: Chronically ill middle-aged male breathing comfortably no acute distress in his hospital bed HEENT:  Normocephalic pupils equal , dentition poor Neck: Supple without JVD, adenopathy, or bruit Chest: Coarse but, symmetrical breath sounds, no rales no tenderness, increased AP diameter of thorax              Cardiovascular:  Regular rate and rhythm, no murmur, no gallop, peripheral pulses             palpable in all extremities Abdomen:  Soft, nontender, no palpable mass or organomegaly Extremities: Warm, well-perfused, no clubbing cyanosis edema or tenderness,              no venous stasis changes of the legs Rectal/GU: Deferred Neuro: Grossly non--focal and symmetrical throughout Skin: Clean and dry without rash or ulceration    Diagnostic Studies & Laboratory data:     Recent Radiology Findings:   DG CHEST PORT 1 VIEW  Result Date: 08/10/2022 CLINICAL DATA:  Central line placement. EXAM: PORTABLE CHEST 1 VIEW COMPARISON:  Chest radiograph dated 08/10/2022. FINDINGS: Interval placement of a right IJ central venous line with tip at the cavoatrial junction. No pneumothorax. Stable cardiomediastinal silhouette. Left pectoral AICD device. No acute osseous pathology. IMPRESSION: Right IJ central venous line with tip at the cavoatrial junction. No pneumothorax. Electronically Signed   By: Elgie Collard M.D.   On: 08/10/2022 20:42   CARDIAC CATHETERIZATION  Result Date: 08/10/2022   Prox Cx lesion is 20% stenosed.   Mid LAD lesion is 50% stenosed.   The left ventricular ejection fraction is less than 25% by visual estimate. Findings: Ao = 98/73 (79) LV = 101/39 RA = 19 RV = 76/21 PA = 80/42 (57) PCW = 47 (v = 55) Fick cardiac output/index = 2.7/1.4 PVR = 3.0 WU Ao sat = 96% PA sat = 41%, 39% PAPi = 2.0 Assessment: 1. Mild non-obstructive CAD 2. Severe NICM with severely elevated filling pressures and low output state consistent with cardiogenic shock Plan/Discussion: Transfer to ICU. Start milrinone and NE for support. Diurese. Will need to evaluate for LVAD candidacy. (PFTs have been a barrier in past) Arvilla Meres, MD 7:29 PM  DG Chest 2 View  Result Date: 08/10/2022 CLINICAL DATA:  CHF EXAM: CHEST - 2 VIEW COMPARISON:  07/11/2022 and older FINDINGS: Enlarged cardiopericardial silhouette. Hyperinflation with  chronic lung changes. No consolidation, pneumothorax. Possible interstitial edema. No effusion. Left upper chest defibrillator. Old right-sided rib fractures. Degenerative changes of the spine. There is compression deformity of the lower thoracic vertebral level with sclerotic margins. Favor a chronic process. IMPRESSION: Hyperinflation with chronic changes. Enlarged heart with defibrillator. Trace interstitial changes with Charyl Dancer B-lines Electronically Signed   By: Karen Kays M.D.   On: 08/10/2022 14:05   [place] EKG SITE RITE  Result Date: 08/10/2022 If Site Rite image not attached, placement could not be confirmed due to current cardiac rhythm.     Recent Lab Findings: Lab Results  Component Value Date   WBC 9.1 08/11/2022   HGB 12.0 (L) 08/11/2022   HCT 35.9 (L) 08/11/2022   PLT 202 08/11/2022   GLUCOSE 106 (H) 08/11/2022   CHOL 102 07/11/2022   TRIG 50 07/11/2022   HDL 33 (L) 07/11/2022   LDLCALC 59 07/11/2022   ALT 77 (H) 08/10/2022   AST 61 (H) 08/10/2022   NA 137 08/11/2022   K 3.3 (L) 08/11/2022   CL 88 (L) 08/11/2022   CREATININE 1.35 (H) 08/11/2022   BUN 27 (H) 08/11/2022   CO2 33 (H) 08/11/2022   TSH 0.974 07/11/2022   INR  1.7 (H) 08/11/2022   HGBA1C 5.1 07/11/2022      Assessment / Plan:   57 year old male with compensated severe LV dysfunction now with symptomatic low cardiac output heart failure 3 to 4 weeks after a CVA. He appears to have significant cardiac and pulmonary involvement with sarcoidosis. I am concerned based on his previous PFTs that he would poorly tolerate sternotomy. I agree that we should proceed with full evaluation for mechanical support to determine the  best way to help this unfortunate patient.  Lovett Sox MD

## 2022-08-12 ENCOUNTER — Other Ambulatory Visit: Payer: Self-pay

## 2022-08-12 DIAGNOSIS — R570 Cardiogenic shock: Secondary | ICD-10-CM | POA: Diagnosis not present

## 2022-08-12 DIAGNOSIS — Z515 Encounter for palliative care: Secondary | ICD-10-CM | POA: Diagnosis not present

## 2022-08-12 DIAGNOSIS — I25118 Atherosclerotic heart disease of native coronary artery with other forms of angina pectoris: Secondary | ICD-10-CM | POA: Diagnosis not present

## 2022-08-12 DIAGNOSIS — D8685 Sarcoid myocarditis: Secondary | ICD-10-CM | POA: Diagnosis not present

## 2022-08-12 DIAGNOSIS — Z7189 Other specified counseling: Secondary | ICD-10-CM | POA: Diagnosis not present

## 2022-08-12 DIAGNOSIS — I5023 Acute on chronic systolic (congestive) heart failure: Secondary | ICD-10-CM | POA: Diagnosis not present

## 2022-08-12 DIAGNOSIS — I472 Ventricular tachycardia, unspecified: Secondary | ICD-10-CM | POA: Diagnosis not present

## 2022-08-12 LAB — MRSA NEXT GEN BY PCR, NASAL: MRSA by PCR Next Gen: NOT DETECTED

## 2022-08-12 LAB — COOXEMETRY PANEL
Carboxyhemoglobin: 1.7 % — ABNORMAL HIGH (ref 0.5–1.5)
Methemoglobin: <0.7 % (ref 0.0–1.5)
O2 Saturation: 55.2 %
Total hemoglobin: 12.5 g/dL (ref 12.0–16.0)

## 2022-08-12 MED ORDER — SODIUM CHLORIDE 0.9% FLUSH
10.0000 mL | Freq: Two times a day (BID) | INTRAVENOUS | Status: DC
  Administered 2022-08-12 – 2022-08-19 (×10): 10 mL

## 2022-08-12 MED ORDER — CHLORHEXIDINE GLUCONATE 0.12 % MT SOLN
15.0000 mL | Freq: Two times a day (BID) | OROMUCOSAL | Status: DC
  Administered 2022-08-12 – 2022-08-22 (×20): 15 mL via OROMUCOSAL
  Filled 2022-08-12 (×20): qty 15

## 2022-08-12 MED ORDER — ADULT MULTIVITAMIN W/MINERALS CH
1.0000 | ORAL_TABLET | Freq: Every day | ORAL | Status: DC
  Administered 2022-08-12 – 2022-08-30 (×16): 1 via ORAL
  Filled 2022-08-12 (×16): qty 1

## 2022-08-12 MED ORDER — MUPIROCIN 2 % EX OINT: 1.0000 | TOPICAL_OINTMENT | Freq: Two times a day (BID) | CUTANEOUS | Status: DC

## 2022-08-12 MED ORDER — ENSURE ENLIVE PO LIQD
237.0000 mL | Freq: Two times a day (BID) | ORAL | Status: DC
  Administered 2022-08-13 – 2022-08-23 (×18): 237 mL via ORAL

## 2022-08-12 MED ORDER — POTASSIUM CHLORIDE CRYS ER 20 MEQ PO TBCR
40.0000 meq | EXTENDED_RELEASE_TABLET | ORAL | Status: AC
  Administered 2022-08-12 (×2): 40 meq via ORAL
  Filled 2022-08-12 (×2): qty 2

## 2022-08-12 MED ORDER — SODIUM CHLORIDE 0.9% FLUSH: 10.0000 mL | INTRAVENOUS | Status: DC | PRN

## 2022-08-12 NOTE — Progress Notes (Signed)
Progress Note   Patient: Charles Holmes GNF:[id number] DOB: 25-Aug-1965 DOA: 08/10/2022     2 DOS: the patient was seen and examined on 08/12/2022   Brief hospital course: Charles Holmes was admitted to the hospital with the working diagnosis of heart failure decompensation.   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 For the last 12 days he developed worsening dyspnea, generalized fatigue and weakness. He was instructed to increase his furosemide dose to bid, with no significant improvement in his symptoms. On the day of admission he was evaluated at the heart failure outpatient clinic, he was found with worsening heart failure and was referred to the ED for further evaluation.  On his initial physical examination his blood pressure was 116/84, HR 87, RR 18 and 02 saturation 94%, positive JVD, lungs with bilateral rales, and increased work of breathing, heart with S1 and S2 present and regular, with no gallops or murmurs, abdomen with no distention and positive lower extremity edema ++.   Chest radiograph with cardiomegaly, bilateral hilar vascular congestion with cephalization of the vasculature, bilateral interstitial infiltrates, symmetric and central. No pleural effusions. Pace maker defibrillator in place with one right atrial lead, and biventricular leads.   EKG 79 bpm, left axis deviation, left anterior fascicular block, right bundle branch block, ventricular paced rhythm with no significant ST segment or T wave changes.   Patient was placed on IV furosemide for diuresis. 07/25 right heart catheterization, with low cardiac output and high filling pressures, consistent with cardiogenic shock.  Started on inotropic support, milrinone and norepinephrine  infusions.  07/26 consulted CT surgery for possible LVAT.      Assessment and Plan: * Acute on chronic systolic CHF (congestive heart failure) (HCC) Echocardiogram with reduced LV systolic function <20%, severe dilatation of LV cavity, no LVH, LV inferior wall septum akinetic, hypokinetic entire anterior wall, entire lateral wall, anterior septum, entire inferior wall, and apex. RV systolic function preserved, RVSP 59,0 mmHg.  LA with mild dilatation, moderate pericardial effusion, localized near the right atrium and anterior to the right ventricle with no signs of tamponade, moderate mitral valve regurgitation, moderate TR.   07/25 cardiac catheterization  PA 80/42 mean 57 PCWP 47 Cardiac output 2.7 and index 1,4 (Fick). PVR 3,0 WU  Cardiogenic shock.  Pre and post capillary pulmonary hypertension.   Urine output 4,250 ml Systolic blood pressure 100 mmHg.   Vasopressor and inotropic support with milrinone and norepinephrine.  Continue digoxin.  Furosemide drip 10 mg per hr.   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  Consultation for possible LVAT.  Follow up with CT surgery recommendations.   CAD (coronary artery disease) Coronary angiography with proximal Cx 20 % stenosis, mid LAD 50% stenosis. Continue with atorvastatin.     HTN (hypertension) Continue blood pressure support with milrinone and norepinephrine.   Ventricular tachycardia (HCC) Patient on mexiletine.  Continue telemetry monitoring. Keep K at 4 and Mg at 2.   Cardiac sarcoidosis Continue with methotrexate.   AKI (acute kidney injury) (HCC) Hypokalemia.   Renal function with serum cr at 1,15 with K at 3,8 and serum bicarbonate at 34.  Na 136.   Plan to continue hemodynamic support and diuresis. 40 meq Kcl po x2 to avoid hypokalemia.  Follow up renal function in am.  History of CVA (cerebrovascular accident) Continue with statin therapy.  Patient on heparin IV for anticoagulation in  preparation for possible invasive cardiac intervention.   Paroxysmal atrial fibrillation (HCC) Continue telemetry monitoring.  Anticoagulation with heparin IV for now.   Iron deficiency anemia Continue iron supplementation.         Subjective: Patient with improvement in dyspnea and edema, no chest pain, no nausea or vomiting.   Physical Exam: Vitals:   08/12/22 1100 08/12/22 1137 08/12/22 1200 08/12/22 1300  BP: 92/61  104/74 101/72  Pulse: 78  82 83  Resp: 15  20 19   Temp:  97.7 F (36.5 C)    TempSrc:  Axillary    SpO2: 92%  94% 94%  Weight:      Height:       Neurology awake and alert ENT with no pallor Cardiovascular with S1 and S2 present and rhythmic with no gallops, rubs or murmurs Respiratory with no rales or wheezing, no rhonchi Abdomen with no distention  Trace lower extremity edema  Data Reviewed:    Family Communication: no family at the bedside   Disposition: Status is: Inpatient Remains inpatient appropriate because: recovering cardiogenic shock   Planned Discharge Destination: Home    Author: Coralie Keens, MD 08/12/2022 3:06 PM  For on call review [email or web address].

## 2022-08-12 NOTE — Progress Notes (Signed)
Daily Progress Note   Patient Name: Charles Holmes       Date: 08/12/2022 DOB: 1966/01/07  Age: 57 y.o. MRN#: [id number] Attending Physician: Coralie Keens Primary Care Physician: [name], PA-C Admit Date: 08/10/2022  Reason for Consultation/Follow-up: Establishing goals of care in setting of evaluation for LVAD.  Subjective: Patient was sitting in the chair. He was able to eat breakfast.   Length of Stay: 2  Current Medications: Scheduled Meds:   atorvastatin  80 mg Oral Daily   chlorhexidine  15 mL Mouth/Throat BID   Chlorhexidine Gluconate Cloth  6 each Topical Daily   digoxin  0.125 mg Oral Daily   dorzolamide-timolol  1 drop Both Eyes BID   folic acid  1 mg Oral Daily   latanoprost  1 drop Both Eyes QHS   methotrexate  20 mg Oral Weekly   mexiletine  250 mg Oral BID   mometasone-formoterol  2 puff Inhalation BID   pantoprazole  40 mg Oral QAC breakfast   potassium chloride  40 mEq Oral Q4H   sodium chloride flush  10-40 mL Intracatheter Q12H   sodium chloride flush  3 mL Intravenous Q12H   sodium chloride flush  3 mL Intravenous Q12H    Continuous Infusions:  sodium chloride Stopped (08/11/22 0622)   sodium chloride 10 mL/hr at 08/12/22 1000   furosemide (LASIX) 200 mg in dextrose 5 % 100 mL (2 mg/mL) infusion 10 mg/hr (08/12/22 1000)   heparin 1,200 Units/hr (08/12/22 1000)   milrinone 0.25 mcg/kg/min (08/12/22 1000)   norepinephrine (LEVOPHED) Adult infusion 2 mcg/min (08/12/22 1000)    PRN Meds: sodium chloride, sodium chloride, acetaminophen, ondansetron (ZOFRAN) IV, mouth rinse, sodium chloride flush, sodium chloride flush, sodium chloride flush  Physical Exam Vitals reviewed.  Constitutional:      Interventions: Nasal cannula in place.   HENT:     Head: Normocephalic and atraumatic.  Pulmonary:     Effort: Pulmonary effort is normal.  Skin:    General: Skin is warm and dry.  Neurological:     Mental Status: He is alert and oriented to person, place, and time.  Psychiatric:        Mood and Affect: Mood normal.        Behavior: Behavior normal.        Thought  Content: Thought content normal.        Judgment: Judgment normal.             Vital Signs: BP 99/67   Pulse 85   Temp 97.7 F (36.5 C) (Oral)   Resp (!) 23   Ht 5\' 7"  (1.702 m)   Wt 74.6 kg   SpO2 91%   BMI 25.76 kg/m  SpO2: SpO2: 91 % O2 Device: O2 Device: Room Air O2 Flow Rate: O2 Flow Rate (L/min): 2 L/min  Intake/output summary:  Intake/Output Summary (Last 24 hours) at 08/12/2022 1102 Last data filed at 08/12/2022 1000 Gross per 24 hour  Intake 1492.11 ml  Output 4825 ml  Net -3332.89 ml   LBM: Last BM Date :  (PTA) Baseline Weight: Weight: 77.1 kg Most recent weight: Weight: 74.6 kg   Palliative Care Assessment & Plan   Patient Profile: 57 y.o. 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   He is now in the ICU with milrinone and NE support being evaluated for LVAD.  Today's Discussion: The patient states he had a good night but did have to urinate every hour or so due to the diuretics. He is up in the chair and hopeful they will get him up and moving today. He says yesterday was long with all the testing and discussions.  He is hopeful he will be a candidate for a LVAD. He spoke to CM yesterday and is relieved to know there are options for him after discharge.  He spoke with his daughter yesterday about our conversation and gave her our information in case  she has questions. He says I can reach out to her tomorrow as her baby shower is today. We discussed the importance of continued conversation with family and their medical providers regarding overall plan of care and treatment options, ensuring decisions are within the context of the patients values and GOCs.   Recommendations/Plan: Full code Full scope Evaluation for LVAD Continued PMT support: will contact patient's daughter 7/28 in the afternoon  Goals of Care and Additional Recommendations: Limitations on Scope of Treatment: Full Scope Treatment  Code Status:    Code Status Orders  (From admission, onward)           Start     Ordered   08/10/22 1822  Full code  Continuous       Question:  By:  Answer:  Consent: discussion documented in EHR   08/10/22 1822          Detailed review of medical records (labs, imaging, vital signs), medically appropriate exam, discussed with treatment team, counseling and education to the patient, documenting clinical information, and coordination of care.   Time spent: 25 minutes  Thank you for allowing the Palliative Medicine Team to assist in the care of this patient.   Sherryll Burger, NP  Please contact Palliative Medicine Team phone at [phone] for questions and concerns.

## 2022-08-12 NOTE — Progress Notes (Signed)
Advanced Heart Failure Rounding Note  PCP-Cardiologist: Norman Herrlich, MD   Subjective:   Admitted with A/C HFrEF. Taken to cath lab. Elevated filling pressures + cardiogenic shock, cardiac index 1.4. Started on milrinone + NE. Given 80 mg IV lasix.     - 4.2L urine output over the past 24h, negative 2.5L. Improvement in sCr - mixed venous 48 today,  - Doing very well, no complaints. Ambulating around the room without difficulty.   Objective:   Weight Range: 74.6 kg Body mass index is 25.76 kg/m.   Vital Signs:   Temp:  [97.7 F (36.5 C)-97.9 F (36.6 C)] 97.7 F (36.5 C) (07/27 1137) Pulse Rate:  [73-108] 83 (07/27 1300) Resp:  [11-28] 19 (07/27 1300) BP: (89-114)/(61-92) 101/72 (07/27 1300) SpO2:  [85 %-96 %] 94 % (07/27 1300) Weight:  [74.6 kg] 74.6 kg (07/27 0600) Last BM Date :  (PTA)  Weight change: Filed Weights   08/10/22 1240 08/11/22 0403 08/12/22 0600  Weight: 77.1 kg 75.3 kg 74.6 kg    Intake/Output:   Intake/Output Summary (Last 24 hours) at 08/12/2022 1355 Last data filed at 08/12/2022 1300 Gross per 24 hour  Intake 1530.8 ml  Output 4750 ml  Net -3219.2 ml    CVP in the chair 10-11  Physical Exam    General: Sitting in the chair.  No resp difficulty HEENT: Normal Neck: Supple. JVP 9-10 . Carotids 2+ bilat; no bruits. No lymphadenopathy or thyromegaly appreciated. RIJ Cor: PMI nondisplaced. Regular rate & rhythm. No rubs, gallops or murmurs. Lungs: Clear Abdomen: Soft, nontender, nondistended. No hepatosplenomegaly. No bruits or masses. Good bowel sounds. Extremities: No cyanosis, clubbing, rash, R and LLE 1-2edema Neuro: Alert & orientedx3, cranial nerves grossly intact. moves all 4 extremities w/o difficulty. Affect pleasant   Telemetry   V paced 80s   EKG    N/A   Labs    CBC Recent Labs    08/11/22 1416 08/12/22 0458  WBC 10.4 9.4  NEUTROABS 8.2*  --   HGB 11.9* 12.0*  HCT 35.9* 37.3*  MCV 104.4* 106.0*  PLT 181 208    Basic Metabolic Panel Recent Labs    95/28/41 2041 08/11/22 0344 08/11/22 1416 08/12/22 0458  NA  --  137  --  136  K  --  3.3* 3.2* 3.8  CL  --  88*  --  93*  CO2  --  33*  --  34*  GLUCOSE  --  106*  --  97  BUN  --  27*  --  20  CREATININE  --  1.35*  --  1.15  CALCIUM  --  8.8*  --  8.5*  MG 2.0 2.2  --   --   PHOS 4.4  --   --   --    Liver Function Tests Recent Labs    08/10/22 1142  AST 61*  ALT 77*  ALKPHOS 77  BILITOT 3.4*  PROT 6.9  ALBUMIN 3.8   No results for input(s): "LIPASE", "AMYLASE" in the last 72 hours. Cardiac Enzymes No results for input(s): "CKTOTAL", "CKMB", "CKMBINDEX", "TROPONINI" in the last 72 hours.  BNP: BNP (last 3 results) Recent Labs    07/11/22 0209 07/26/22 1211 08/10/22 1142  BNP 1,588.2* 1,723.6* 1,905.0*    ProBNP (last 3 results) No results for input(s): "PROBNP" in the last 8760 hours.   D-Dimer No results for input(s): "DDIMER" in the last 72 hours. Hemoglobin A1C Recent Labs    08/11/22  1530  HGBA1C 5.5   Fasting Lipid Panel Recent Labs    08/11/22 1416  CHOL 90  HDL 26*  LDLCALC 51  TRIG 64  CHOLHDL 3.5   Thyroid Function Tests Recent Labs    08/11/22 1416  TSH 1.211    Other results:   Imaging    [place] EKG SITE RITE  Result Date: 08/12/2022 If Site Rite image not attached, placement could not be confirmed due to current cardiac rhythm.  DG Orthopantogram  Result Date: 08/11/2022 CLINICAL DATA:  [id number] Preoperative evaluation to rule out surgical contraindication [id number]. Pre op counter indications EXAM: ORTHOPANTOGRAM/PANORAMIC COMPARISON:  None Available. FINDINGS: Caries of the left maxillary central and lateral incisors. Periapical lucency of the most posterior remaining left mandibular molar. IMPRESSION: Caries of the left maxillary central and lateral incisors. Periapical lucency of the most posterior remaining left mandibular molar. Please correlate with dental exam. Electronically  Signed   By: Deatra Robinson M.D.   On: 08/11/2022 19:07   CT CHEST ABDOMEN PELVIS WO CONTRAST  Result Date: 08/11/2022 CLINICAL DATA:  preoperative evaluation to rule out contraindication; LVAD pre-surgical eval EXAM: CT CHEST, ABDOMEN AND PELVIS WITHOUT CONTRAST TECHNIQUE: Multidetector CT imaging of the chest, abdomen and pelvis was performed following the standard protocol without IV contrast. RADIATION DOSE REDUCTION: This exam was performed according to the departmental dose-optimization program which includes automated exposure control, adjustment of the mA and/or kV according to patient size and/or use of iterative reconstruction technique. COMPARISON:  08/10/2022, 12/30/2019 FINDINGS: CT CHEST FINDINGS Cardiovascular: Unenhanced imaging of the heart demonstrates moderate cardiomegaly, with small pericardial effusion. Multi lead pacer identified, with leads in the right atrium, right ventricle, and coronary sinus. Normal caliber of the thoracic aorta. Evaluation of the vascular lumen is limited without IV contrast. Atherosclerosis of the aortic arch and LAD distribution of the coronary vasculature. Mediastinum/Nodes: No enlarged mediastinal, hilar, or axillary lymph nodes. Thyroid gland, trachea, and esophagus demonstrate no significant findings. Lungs/Pleura: There is a trace left pleural effusion. Multifocal areas of ground-glass consolidation are seen throughout the lungs diffusely. Differential diagnosis would include small airway disease, inflammation/infection, or developing edema. There is some peripheral denser consolidation within the dependent left lower lobe, which could reflect airspace disease or atelectasis. There is mild bilateral bronchial wall thickening, most pronounced at the lung bases. The central airways are patent. There is a nonspecific 5 mm right upper lobe pulmonary nodule, reference image 52/4, new since the previous exam. Musculoskeletal: No acute or destructive bony  abnormalities. Prior healed right rib fractures. Reconstructed images demonstrate no additional findings. CT ABDOMEN PELVIS FINDINGS Hepatobiliary: Unremarkable unenhanced appearance of the liver. Calcified gallstone without evidence of acute cholecystitis. No biliary duct dilation. Pancreas: Unremarkable unenhanced appearance. Spleen: Unremarkable unenhanced appearance. Adrenals/Urinary Tract: Stable appearance of the adrenal glands. Punctate less than 2 mm nonobstructing calculus within the left kidney. No left-sided obstruction. There is a nonobstructing 4 mm proximal right ureteral calculus reference image 74/3. No evidence of right-sided obstruction. Bladder is moderately distended without evidence of filling defect. Stomach/Bowel: No bowel obstruction or ileus. Normal appendix right lower quadrant. Minimal sigmoid diverticulosis without diverticulitis. No bowel wall thickening or inflammatory change. Vascular/Lymphatic: Aortic atherosclerosis. No enlarged abdominal or pelvic lymph nodes. Reproductive: Prostate is unremarkable. Bilateral hydroceles are partially visualized, left greater than right. Other: No free fluid or free intraperitoneal gas. There are fat containing inguinal hernias, right greater than left. Musculoskeletal: No acute or destructive bony abnormalities. Reconstructed images demonstrate chronic compression deformity at the  L1 level. IMPRESSION: Chest: 1. Cardiomegaly, with trace pericardial effusion. 2. Multifocal ground-glass consolidation bilaterally, nonspecific. This could reflect underlying inflammatory/infectious etiology, edema, or small airway disease. 3. Trace left pleural effusion. 4. Small area of dense peripheral consolidation dependent left lower lobe, favor atelectasis over airspace disease. 5. Bibasilar bronchial wall thickening consistent with bronchitis. 6. Right solid pulmonary nodule within the upper lobe measuring 5 mm. Per Fleischner Society Guidelines, if patient is low  risk for malignancy, no routine follow-up imaging is recommended. If patient is high risk for malignancy, a non-contrast Chest CT at 12 months is optional. If performed and the nodule is stable at 12 months, no further follow-up is recommended. These guidelines do not apply to immunocompromised patients and patients with cancer. Follow up in patients with significant comorbidities as clinically warranted. For lung cancer screening, adhere to Lung-RADS guidelines. Reference: Radiology. 2017; 284(1):228-43. 7. Aortic Atherosclerosis (ICD10-I70.0). Coronary artery atherosclerosis. Abdomen/pelvis: 1. Non-obstructing left renal and right ureteral calculi as above. No evidence of obstructive uropathy within either kidney. 2. Cholelithiasis without cholecystitis. 3. Bilateral hydroceles, left greater than right. This is incompletely evaluated due to slice selection. 4.  Aortic Atherosclerosis (ICD10-I70.0). 5. Fat containing inguinal hernias, right greater than left. Electronically Signed   By: Sharlet Salina M.D.   On: 08/11/2022 17:03     Medications:     Scheduled Medications:  atorvastatin  80 mg Oral Daily   chlorhexidine  15 mL Mouth/Throat BID   Chlorhexidine Gluconate Cloth  6 each Topical Daily   digoxin  0.125 mg Oral Daily   dorzolamide-timolol  1 drop Both Eyes BID   folic acid  1 mg Oral Daily   latanoprost  1 drop Both Eyes QHS   methotrexate  20 mg Oral Weekly   mexiletine  250 mg Oral BID   mometasone-formoterol  2 puff Inhalation BID   pantoprazole  40 mg Oral QAC breakfast   potassium chloride  40 mEq Oral Q4H   sodium chloride flush  10-40 mL Intracatheter Q12H   sodium chloride flush  3 mL Intravenous Q12H   sodium chloride flush  3 mL Intravenous Q12H    Infusions:  sodium chloride Stopped (08/11/22 0622)   sodium chloride 10 mL/hr at 08/12/22 1300   furosemide (LASIX) 200 mg in dextrose 5 % 100 mL (2 mg/mL) infusion 10 mg/hr (08/12/22 1300)   heparin 1,200 Units/hr  (08/12/22 1300)   milrinone 0.25 mcg/kg/min (08/12/22 1300)   norepinephrine (LEVOPHED) Adult infusion 2 mcg/min (08/12/22 1300)    PRN Medications: sodium chloride, sodium chloride, acetaminophen, ondansetron (ZOFRAN) IV, mouth rinse, sodium chloride flush, sodium chloride flush, sodium chloride flush    Patient Profile   Mr Quiggle is a 57 y.o. 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   Assessment/Plan  1.  Acute on chronic Systolic HF-->Cardiogenic Shock  - Diagnosed 11/2019. Presented with VT. LHC 70% LAD  -- cMRI 12/21 concerning for sarcoid and EF 18%.  - PET 2/22 at [facility] EF 25% + active sarcoid - Echo 08/26/20 EF < 20% severely dilated LV RV mildly decreased.  - CPX 8/22. Unable to complete PFTs due to hypoventilation and poor effort. pVO2 16.1 (49% predicted). Slope 19 RER: 1.17 PETCO2 59 - Med[employer] CRT-D upgrade in 06/08/21 - Echo 11/07/21: EF 20-25% RV mild to moderately reduced.  - Echo 07/10/22: EF <20%, RV okay, mod pericardial effusion, mod MR/TR - RHC 6/28 showed mean RA 9, PA 58/26,  mean PCWP 30, CI 2.6>>diuresed w/ IV Lasix  Optivol - Fluid index trending up and thoracic impedance down. Activity trending down to 0.5 hours.  - On admit taken to the cath lab. Elevated filling pressures and lowoutput.  - Continue digoxin 0.125 mcg daily.  - VAD coordinators aware, to see patient tomorrow and start formal work up.  - CVP improving, now 8-10 after 4L urine output yesterday with improvement in Scr.  - CO-OX 48% overnight on levophed 1-36mcg & milrinone 0.72mcg/kg/min. Repeat mixed venous today; I suspect it is higher.  - Placing PICC line this AM. D/C RIJV central line.   2.  Hx of stroke -Admitted with acute L sided weakness 6/24, CTA revealed an acute right M1 occlusion. S/p TPA and mechanical clot extraction. No residual deficits  -Likely cardioembolic in setting of severe LV dysfunction. Paroxsymal AT/AF also noted on  device interrogation. EKG today A sensed- v paced w/ underlying rhythm sinus.  - Off eliquis  - Continue heparin drip until all procedures completed.  - VAD coordinators to see today. Start VAD work up.    3. 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 Continue mexilitene  - now s/p ICD. No VT on interrogation.    4. CAD - LHC 12/07/19 70-% LAD, no intervention - LHC mild non obstructive CAD.  - Continue statin and aspirin.    5. Cardiac sarcoid - PET 2/22 at [facility] EF 25% + active sarcoid - Has completed prednisone.  - Takes MTX 20 weekly, Saturday. Restart folic acid.  - Previously discussed EM biopsy but patient is reluctant  - Needs repeat PET, wasn't able to schedule 10/23 as insurance would not cover. Now has medicaid and medicare. 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  - on Eliquis for recent CVA, will switch to heparin gtt   7. AKI - Creatinine baseline around 1.1 - Trending down with inotropes. 1.35 - suspect cardiorenal - monitor with diuresis   8. Iron deficiency anemia - recent T sat 15%, scheduled for OP feraheme tomorrow. Will complete inpatient after workup complete.      Length of Stay: 2  Masashi Snowdon, DO  08/12/2022, 1:55 PM  Advanced Heart Failure Team Pager [phone] (M-F; 7a - 5p)  Please contact [facility] for night-coverage after hours (5p -7a ) and weekends on [email or web address]  CRITICAL CARE Performed by: Dorthula Nettles   Total critical care time: 35 minutes  Critical care time was exclusive of separately billable procedures and treating other patients.  Critical care was necessary to treat or prevent imminent or life-threatening deterioration.  Critical care was time spent personally by me on the following activities: development of treatment plan with patient and/or surrogate as well as nursing, discussions with consultants, evaluation of patient's response to  treatment, examination of patient, obtaining history from patient or surrogate, ordering and performing treatments and interventions, ordering and review of laboratory studies, ordering and review of radiographic studies, pulse oximetry and re-evaluation of patient's condition.

## 2022-08-12 NOTE — Progress Notes (Signed)
Initial Nutrition Assessment   INTERVENTION:   Ensure Enlive po BID, each supplement provides 350 kcal and 20 grams of protein.  MVI with Minerals daily  If po intake becomes inadequate, recommend liberalizing diet to REGULAR for now   NUTRITION DIAGNOSIS:   Inadequate oral intake related to catabolic illness, poor appetite as evidenced by per patient/family report.  GOAL:   Patient will meet greater than or equal to 90% of their needs  MONITOR:   PO intake, Supplement acceptance, Labs, Weight trends  REASON FOR ASSESSMENT:   Consult LVAD Eval  ASSESSMENT:   57 yo male admitted from cardiologist office with worsening fatigue, shortness of breath and poor appetite with acute on chronic HF with significant reduced LV dysfunction (EF 20%) and preserved RV function. Pt being evaluated for possible LVAD placement. PMH includes cardiac and pulmonary sarcoidosis, CAD, GERD, HLD, HTN. Noted recent hospitalization from 6/24-6/30 this year with acute emoblic CVA.  7/25 Admitted 7/26 LVAD work-up initiated  Pt remains on levophed at 2, milrinone and lasix gtt continuing  Per chart review, pt appetite has been poor since stroke one moth ago. Pt lives at home alone but does have family nearby.   Currently on Heart Healthy diet but no recorded po intake. RN reports pt is eating and drinking well at current time. Plan to add Ensure for added nutrition to "catch up" given poor intake at home since stroke.   Noted no current tobacco use, minimal alcohol use.   Current wt 74.6 kg, admission wt around 77 kg. Noted moderate edema present per RN assessment. Net negative 3L per I/O flow sheet; with 4.25 L UOP in 24 hours, on lasix gtt  Noted no bowel regimen ordered but pt with type 4 medium BM today. Monitor for constipation  Labs: Creatinine/BUN wdl, sodium 136 (wdl), potassium 3.8 (wdl), Phosphorus 4.4 (wdl), magnesium 2.2 (wdl), prealbumin 17 Meds: potassium chloride, methotrexate,  folic acid   NUTRITION - FOCUSED PHYSICAL EXAM:  Unable to assess; RD to perform on follow-up  Diet Order:   Diet Order             Diet Heart Room service appropriate? Yes; Fluid consistency: Thin  Diet effective now                   EDUCATION NEEDS:   Not appropriate for education at this time  Skin:  Skin Assessment: Reviewed RN Assessment  Last BM:  7/27 type 4 medium  Height:   Ht Readings from Last 1 Encounters:  08/12/22 5\' 7"  (1.702 m)    Weight:   Wt Readings from Last 1 Encounters:  08/12/22 74.6 kg    BMI:  Body mass index is 25.76 kg/m.  Estimated Nutritional Needs:   Kcal:  2000-2200 kcals  Protein:  100-115 g  Fluid:  1.8 L    Romelle Starcher MS, RDN, LDN, CNSC Registered Dietitian 3 Clinical Nutrition RD Pager and On-Call Pager Number Located in [place]

## 2022-08-12 NOTE — Progress Notes (Signed)
Peripherally Inserted Central Catheter Placement  The IV Nurse has discussed with the patient and/or persons authorized to consent for the patient, the purpose of this procedure and the potential benefits and risks involved with this procedure.  The benefits include less needle sticks, lab draws from the catheter, and the patient may be discharged home with the catheter. Risks include, but not limited to, infection, bleeding, blood clot (thrombus formation), and puncture of an artery; nerve damage and irregular heartbeat and possibility to perform a PICC exchange if needed/ordered by physician.  Alternatives to this procedure were also discussed.  Bard Power PICC patient education guide, fact sheet on infection prevention and patient information card has been provided to patient /or left at bedside.    PICC Placement Documentation  PICC Double Lumen 08/12/22 Right Brachial 39 cm 0 cm (Active)  Indication for Insertion or Continuance of Line Vasoactive infusions 08/12/22 1424  Exposed Catheter (cm) 0 cm 08/12/22 1424  Site Assessment Clean, Dry, Intact 08/12/22 1424  Lumen #1 Status Flushed;Blood return noted 08/12/22 1424  Lumen #2 Status Flushed;Blood return noted;Saline locked 08/12/22 1424  Dressing Type Transparent 08/12/22 1424  Dressing Status Antimicrobial disc in place 08/12/22 1424  Safety Lock Not Applicable 08/12/22 1424  Line Care Connections checked and tightened 08/12/22 1424  Line Adjustment (NICU/IV Team Only) No 08/12/22 1424  Dressing Intervention New dressing 08/12/22 1424  Dressing Change Due 08/19/22 08/12/22 1424       Charles Holmes, [place] 08/12/2022, 2:25 PM

## 2022-08-12 NOTE — Progress Notes (Signed)
ANTICOAGULATION CONSULT NOTE  Pharmacy Consult for Heparin (Eliquis on hold) Indication: atrial fibrillation  Allergies  Allergen Reactions   Amiodarone     Severe tremors   Percocet [Oxycodone-Acetaminophen] Itching    Patient Measurements: Height: 5\' 7"  (170.2 cm) Weight: 74.6 kg (164 lb 7.4 oz) IBW/kg (Calculated) : 66.1 Heparin Dosing Weight: total weight  Vital Signs: Temp: 97.7 F (36.5 C) (07/27 0755) Temp Source: Oral (07/27 0755) BP: 104/70 (07/27 0900) Pulse Rate: 85 (07/27 0900)  Labs: Recent Labs    08/10/22 1142 08/10/22 1649 08/10/22 1831 08/11/22 0344 08/11/22 1100 08/11/22 1416 08/11/22 2230 08/12/22 0458  HGB 12.3*  --    < > 12.0*  --  11.9*  --  12.0*  HCT 38.0*  --    < > 35.9*  --  35.9*  --  37.3*  PLT 217  --   --  202  --  181  --  208  APTT  --   --   --   --  48*  --  75* 91*  LABPROT  --   --   --   --  19.7*  --   --   --   INR  --   --   --   --  1.7*  --   --   --   HEPARINUNFRC  --   --   --   --  >1.10*  --   --  >1.10*  CREATININE 1.56*  --   --  1.35*  --   --   --  1.15  TROPONINIHS 103* 90*  --   --   --   --   --   --    < > = values in this interval not displayed.    Estimated Creatinine Clearance: 64.7 mL/min (by C-G formula based on SCr of 1.15 mg/dL).   Medical History: Past Medical History:  Diagnosis Date   CAD (coronary artery disease)    CHF (congestive heart failure) (HCC)    GERD (gastroesophageal reflux disease)    Hyperlipidemia    Hypertension    Systolic heart failure (HCC) 2021   LVEF 18%, RVEF 38% on cardiac MRI 12/19/2019. possible cardiac sarcoidosis.   Wide-complex tachycardia 2021   wears LifeVest    Medications:  Scheduled:   atorvastatin  80 mg Oral Daily   chlorhexidine  15 mL Mouth/Throat BID   Chlorhexidine Gluconate Cloth  6 each Topical Daily   digoxin  0.125 mg Oral Daily   dorzolamide-timolol  1 drop Both Eyes BID   folic acid  1 mg Oral Daily   latanoprost  1 drop Both Eyes QHS    methotrexate  20 mg Oral Weekly   mexiletine  250 mg Oral BID   mometasone-formoterol  2 puff Inhalation BID   pantoprazole  40 mg Oral QAC breakfast   sodium chloride flush  10-40 mL Intracatheter Q12H   sodium chloride flush  3 mL Intravenous Q12H   sodium chloride flush  3 mL Intravenous Q12H   Infusions:   sodium chloride Stopped (08/11/22 0622)   sodium chloride 10 mL/hr at 08/12/22 0900   furosemide (LASIX) 200 mg in dextrose 5 % 100 mL (2 mg/mL) infusion 10 mg/hr (08/12/22 0900)   heparin 1,200 Units/hr (08/12/22 0900)   milrinone 0.25 mcg/kg/min (08/12/22 0900)   norepinephrine (LEVOPHED) Adult infusion 2 mcg/min (08/12/22 0900)   PRN: sodium chloride, sodium chloride, acetaminophen, ondansetron (ZOFRAN) IV, mouth rinse, sodium chloride flush,  sodium chloride flush, sodium chloride flush  Assessment: 57 yo male on apixaban for afib, recent CVA felt to be cardioembolic. Presents for cath, work up for LVAD. Last dose of apixaban 7/25 at 07:30, Hgb 11.9, Plts WNL,   Pharmacy consulted to transition apixaban to heparin.  aPTT therapeutic at 81 seconds, heparin level still altered by DOAC use. CBC stable.   Goal of Therapy:  Heparin level 0.3-0.7 units/ml Monitor platelets by anticoagulation protocol: Yes   Plan:  Continue heparin 1200 units/h Daily aPTT, heparin level, CBC  Fredonia Highland, PharmD, [place], [facility] Clinical Pharmacist [phone] Please check AMION for all [facility] Pharm[facility] numbers 08/12/2022

## 2022-08-13 ENCOUNTER — Encounter (HOSPITAL_COMMUNITY): Payer: Self-pay | Admitting: Internal Medicine

## 2022-08-13 DIAGNOSIS — I255 Ischemic cardiomyopathy: Secondary | ICD-10-CM | POA: Diagnosis not present

## 2022-08-13 DIAGNOSIS — I25118 Atherosclerotic heart disease of native coronary artery with other forms of angina pectoris: Secondary | ICD-10-CM | POA: Diagnosis not present

## 2022-08-13 DIAGNOSIS — I472 Ventricular tachycardia, unspecified: Secondary | ICD-10-CM | POA: Diagnosis not present

## 2022-08-13 DIAGNOSIS — R570 Cardiogenic shock: Secondary | ICD-10-CM | POA: Diagnosis not present

## 2022-08-13 DIAGNOSIS — Z515 Encounter for palliative care: Secondary | ICD-10-CM | POA: Diagnosis not present

## 2022-08-13 DIAGNOSIS — I5023 Acute on chronic systolic (congestive) heart failure: Secondary | ICD-10-CM | POA: Diagnosis not present

## 2022-08-13 DIAGNOSIS — D8685 Sarcoid myocarditis: Secondary | ICD-10-CM | POA: Diagnosis not present

## 2022-08-13 DIAGNOSIS — Z7189 Other specified counseling: Secondary | ICD-10-CM | POA: Diagnosis not present

## 2022-08-13 LAB — BASIC METABOLIC PANEL WITH GFR
Anion gap: 11 (ref 5–15)
BUN: 20 mg/dL (ref 6–20)
CO2: 32 mmol/L (ref 22–32)
Calcium: 8.8 mg/dL — ABNORMAL LOW (ref 8.9–10.3)
Chloride: 90 mmol/L — ABNORMAL LOW (ref 98–111)
Creatinine, Ser: 1.16 mg/dL (ref 0.61–1.24)
GFR, Estimated: >60 mL/min (ref >60)
Glucose, Bld: 121 mg/dL — ABNORMAL HIGH (ref 70–99)
Potassium: 3.8 mmol/L (ref 3.5–5.1)
Sodium: 133 mmol/L — ABNORMAL LOW (ref 135–145)

## 2022-08-13 LAB — CBC
HCT: 37.4 % — ABNORMAL LOW (ref 39.0–52.0)
Hemoglobin: 11.9 g/dL — ABNORMAL LOW (ref 13.0–17.0)
MCH: 33.6 pg (ref 26.0–34.0)
MCHC: 31.8 g/dL (ref 30.0–36.0)
MCV: 105.6 fL — ABNORMAL HIGH (ref 80.0–100.0)
Platelets: 176 10*3/uL (ref 150–400)
RBC: 3.54 MIL/uL — ABNORMAL LOW (ref 4.22–5.81)
RDW: 17 % — ABNORMAL HIGH (ref 11.5–15.5)
WBC: 7.9 10*3/uL (ref 4.0–10.5)
nRBC: 0 % (ref 0.0–0.2)

## 2022-08-13 LAB — COOXEMETRY PANEL
Carboxyhemoglobin: 2.1 % — ABNORMAL HIGH (ref 0.5–1.5)
Methemoglobin: 0.8 % (ref 0.0–1.5)
O2 Saturation: 60.4 %
Total hemoglobin: 12.6 g/dL (ref 12.0–16.0)

## 2022-08-13 LAB — APTT: aPTT: 106 s — ABNORMAL HIGH (ref 24–36)

## 2022-08-13 LAB — HEPARIN LEVEL (UNFRACTIONATED): Heparin Unfractionated: 1.02 [IU]/mL — ABNORMAL HIGH (ref 0.30–0.70)

## 2022-08-13 MED ORDER — FUROSEMIDE 40 MG PO TABS
40.0000 mg | ORAL_TABLET | Freq: Every day | ORAL | Status: DC
  Administered 2022-08-13 – 2022-08-15 (×3): 40 mg via ORAL
  Filled 2022-08-13 (×3): qty 1

## 2022-08-13 MED ORDER — POTASSIUM CHLORIDE CRYS ER 20 MEQ PO TBCR
40.0000 meq | EXTENDED_RELEASE_TABLET | Freq: Once | ORAL | Status: AC
  Administered 2022-08-13: 40 meq via ORAL
  Filled 2022-08-13: qty 2

## 2022-08-13 NOTE — Progress Notes (Signed)
3 Days Post-Op Procedure(s) (LRB): RIGHT/LEFT HEART CATH AND CORONARY ANGIOGRAPHY (N/A) Subjective: Appetite and exercise tolerance improved on milrinone Ambulated 1 lap in the hallway CT of chest and abdomen show moderate COPD and liver is okay Working on Facilities manager, formal PFTs planned tomorrow to determine if patient could tolerate VAD surgery/chest incision  Objective: Vital signs in last 24 hours: Temp:  [97.6 F (36.4 C)-98.3 F (36.8 C)] 97.9 F (36.6 C) (07/28 0735) Pulse Rate:  [78-99] 99 (07/28 0900) Cardiac Rhythm: Ventricular paced (07/28 0800) Resp:  [15-25] 20 (07/28 0900) BP: (88-107)/(61-78) 103/78 (07/28 0900) SpO2:  [89 %-95 %] 94 % (07/28 0900) Weight:  [72.3 kg] 72.3 kg (07/28 0500)  Hemodynamic parameters for last 24 hours: CVP:  [5 mmHg-59 mmHg] 59 mmHg  Intake/Output from previous day: 07/27 0701 - 07/28 0700 In: 611.2 [I.V.:611.2] Out: 5050 [Urine:5050] Intake/Output this shift: Total I/O In: 488.3 [P.O.:400; I.V.:88.3] Out: 125 [Urine:125]       Exam    General- alert and comfortable    Neck- no JVD, no cervical adenopathy palpable, no carotid bruit   Lungs- clear without rales, wheezes   Cor- regular rate and rhythm, no murmur , gallop   Abdomen- soft, non-tender   Extremities - warm, non-tender, minimal edema   Neuro- oriented, appropriate, no focal weakness   Lab Results: Recent Labs    08/12/22 0458 08/13/22 0443  WBC 9.4 7.9  HGB 12.0* 11.9*  HCT 37.3* 37.4*  PLT 208 176   BMET:  Recent Labs    08/12/22 0458 08/13/22 0443  NA 136 133*  K 3.8 3.8  CL 93* 90*  CO2 34* 32  GLUCOSE 97 121*  BUN 20 20  CREATININE 1.15 1.16  CALCIUM 8.5* 8.8*    PT/INR:  Recent Labs    08/11/22 1100  LABPROT 19.7*  INR 1.7*   ABG    Component Value Date/Time   PHART 7.434 08/10/2022 1831   HCO3 31.5 (H) 08/10/2022 1835   HCO3 34.0 (H) 08/10/2022 1835   TCO2 33 (H) 08/10/2022 1835   TCO2 36 (H) 08/10/2022 1835   O2SAT  60.4 08/13/2022 0443   CBG (last 3)  No results for input(s): "GLUCAP" in the last 72 hours.  Assessment/Plan: S/P Procedure(s) (LRB): RIGHT/LEFT HEART CATH AND CORONARY ANGIOGRAPHY (N/A) PFTs tomorrow Patient will be discussed at the VAD medical review board meeting tomorrow   LOS: 3 days    Lovett Sox 08/13/2022

## 2022-08-13 NOTE — Progress Notes (Signed)
ANTICOAGULATION CONSULT NOTE  Pharmacy Consult for Heparin (Eliquis on hold) Indication: atrial fibrillation  Allergies  Allergen Reactions   Amiodarone     Severe tremors   Percocet [Oxycodone-Acetaminophen] Itching    Patient Measurements: Height: 5\' 7"  (170.2 cm) Weight: 72.3 kg (159 lb 6.3 oz) IBW/kg (Calculated) : 66.1 Heparin Dosing Weight: total weight  Vital Signs: Temp: 97.9 F (36.6 C) (07/28 0735) Temp Source: Oral (07/28 0735) BP: 92/65 (07/28 1000) Pulse Rate: 90 (07/28 1000)  Labs: Recent Labs    08/10/22 1142 08/10/22 1649 08/10/22 1831 08/11/22 0344 08/11/22 0344 08/11/22 1100 08/11/22 1416 08/11/22 2230 08/12/22 0458 08/13/22 0443  HGB 12.3*  --    < > 12.0*  --   --  11.9*  --  12.0* 11.9*  HCT 38.0*  --    < > 35.9*  --   --  35.9*  --  37.3* 37.4*  PLT 217  --   --  202  --   --  181  --  208 176  APTT  --   --   --   --    < > 48*  --  75* 91* 106*  LABPROT  --   --   --   --   --  19.7*  --   --   --   --   INR  --   --   --   --   --  1.7*  --   --   --   --   HEPARINUNFRC  --   --   --   --   --  >1.10*  --   --  >1.10* 1.02*  CREATININE 1.56*  --   --  1.35*  --   --   --   --  1.15 1.16  TROPONINIHS 103* 90*  --   --   --   --   --   --   --   --    < > = values in this interval not displayed.    Estimated Creatinine Clearance: 64.1 mL/min (by C-G formula based on SCr of 1.16 mg/dL).   Medical History: Past Medical History:  Diagnosis Date   CAD (coronary artery disease)    CHF (congestive heart failure) (HCC)    GERD (gastroesophageal reflux disease)    Hyperlipidemia    Hypertension    Systolic heart failure (HCC) 2021   LVEF 18%, RVEF 38% on cardiac MRI 12/19/2019. possible cardiac sarcoidosis.   Wide-complex tachycardia 2021   wears LifeVest    Medications:  Scheduled:   atorvastatin  80 mg Oral Daily   chlorhexidine  15 mL Mouth/Throat BID   Chlorhexidine Gluconate Cloth  6 each Topical Daily   digoxin  0.125 mg  Oral Daily   dorzolamide-timolol  1 drop Both Eyes BID   feeding supplement  237 mL Oral BID BM   folic acid  1 mg Oral Daily   furosemide  40 mg Oral Daily   latanoprost  1 drop Both Eyes QHS   methotrexate  20 mg Oral Weekly   mexiletine  250 mg Oral BID   mometasone-formoterol  2 puff Inhalation BID   multivitamin with minerals  1 tablet Oral Daily   pantoprazole  40 mg Oral QAC breakfast   sodium chloride flush  10-40 mL Intracatheter Q12H   sodium chloride flush  10-40 mL Intracatheter Q12H   sodium chloride flush  3 mL Intravenous Q12H   sodium  chloride flush  3 mL Intravenous Q12H   Infusions:   sodium chloride Stopped (08/11/22 0622)   sodium chloride Stopped (08/12/22 1638)   heparin 1,200 Units/hr (08/13/22 1000)   milrinone 0.25 mcg/kg/min (08/13/22 1000)   norepinephrine (LEVOPHED) Adult infusion Stopped (08/12/22 1339)   PRN: sodium chloride, sodium chloride, acetaminophen, ondansetron (ZOFRAN) IV, mouth rinse, sodium chloride flush, sodium chloride flush, sodium chloride flush, sodium chloride flush  Assessment: 57 yo male on apixaban for afib, recent CVA felt to be cardioembolic. Presents for cath, work up for LVAD. Last dose of apixaban 7/25 at 07:30, Hgb 11.9, Plts WNL,   Pharmacy consulted to transition apixaban to heparin.  aPTT slightly above goal at 106 seconds, CBC stable, heparin level altered by DOAC.   Goal of Therapy:  Heparin level 0.3-0.7 units/ml Monitor platelets by anticoagulation protocol: Yes   Plan:  Reduce heparin to 1100 units/h Daily aPTT, heparin level, CBC  Fredonia Highland, PharmD, [place], [facility] Clinical Pharmacist [phone] Please check AMION for all [facility] Pharm[facility] numbers 08/13/2022

## 2022-08-13 NOTE — Progress Notes (Addendum)
Daily Progress Note   Patient Name: Charles Holmes       Date: 08/13/2022 DOB: Mar 03, 1965  Age: 57 y.o. MRN#: [id number] Attending Physician: Coralie Keens Primary Care Physician: [name], PA-C Admit Date: 08/10/2022  Reason for Consultation/Follow-up: Establishing goals of care in setting of evaluation for LVAD.  Subjective: Patient sitting in bed. Attempting to drink an Ensure. No concerns.  Length of Stay: 3  Current Medications: Scheduled Meds:   atorvastatin  80 mg Oral Daily   chlorhexidine  15 mL Mouth/Throat BID   Chlorhexidine Gluconate Cloth  6 each Topical Daily   digoxin  0.125 mg Oral Daily   dorzolamide-timolol  1 drop Both Eyes BID   feeding supplement  237 mL Oral BID BM   folic acid  1 mg Oral Daily   furosemide  40 mg Oral Daily   latanoprost  1 drop Both Eyes QHS   methotrexate  20 mg Oral Weekly   mexiletine  250 mg Oral BID   mometasone-formoterol  2 puff Inhalation BID   multivitamin with minerals  1 tablet Oral Daily   pantoprazole  40 mg Oral QAC breakfast   sodium chloride flush  10-40 mL Intracatheter Q12H   sodium chloride flush  10-40 mL Intracatheter Q12H   sodium chloride flush  3 mL Intravenous Q12H   sodium chloride flush  3 mL Intravenous Q12H    Continuous Infusions:  sodium chloride Stopped (08/11/22 0622)   sodium chloride Stopped (08/12/22 1638)   heparin 1,100 Units/hr (08/13/22 1105)   milrinone 0.25 mcg/kg/min (08/13/22 1000)   norepinephrine (LEVOPHED) Adult infusion Stopped (08/12/22 1339)    PRN Meds: sodium chloride, sodium chloride, acetaminophen, ondansetron (ZOFRAN) IV, mouth rinse, sodium chloride flush, sodium chloride flush, sodium chloride flush, sodium chloride flush  Physical Exam Vitals reviewed.   HENT:     Head: Normocephalic and atraumatic.  Pulmonary:     Effort: Pulmonary effort is normal.  Skin:    General: Skin is warm and dry.  Neurological:     Mental Status: He is alert and oriented to person, place, and time.  Psychiatric:        Mood and Affect: Mood normal.        Behavior: Behavior normal.        Thought Content: Thought content normal.  Judgment: Judgment normal.             Vital Signs: BP 92/65   Pulse 90   Temp 97.9 F (36.6 C) (Oral)   Resp 15   Ht 5\' 7"  (1.702 m)   Wt 72.3 kg   SpO2 (!) 88%   BMI 24.96 kg/m  SpO2: SpO2: (!) 88 % O2 Device: O2 Device: Room Air O2 Flow Rate: O2 Flow Rate (L/min): 2 L/min  Intake/output summary:  Intake/Output Summary (Last 24 hours) at 08/13/2022 1116 Last data filed at 08/13/2022 1000 Gross per 24 hour  Intake 983.64 ml  Output 4500 ml  Net -3516.36 ml   LBM: Last BM Date : 08/12/22 Baseline Weight: Weight: 77.1 kg Most recent weight: Weight: 72.3 kg    Palliative Care Assessment & Plan   Patient Profile: 57 y.o. 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   He is now in the ICU with milrinone and NE support being evaluated for LVAD.  Today's Discussion: Patient was able to work with PT today. He is glad his team is stopping the IV lasix. He hopes to get better sleep tonight. His daughter is coming to visit today.   I told him I would like to discuss advanced directives with his daughter present since she is his proxy decision maker. He is worried about upsetting her since she is pregnant. He will ask her if she is comfortable discussing this later today.   When I asked if he had considered advanced care planning and  living will, he said he is trying to stay positive and these conversations are not positive. We discussed hoping/praying for the best case scenario but being prepared for the worst knowing his heart failure is so severe. I expressed understanding that these conversations are difficult. I encouraged him to consider what he would want to do broadly and in certain situations such as if he would want intubation or a feeding tube. He is agreeable to considering these and discussing with his daughter if she is agreeable later today.  ------------------------------------- Addendum 15:15 Met with patient, his daughter, and his son-in-law. We discussed the concepts of a MOST form. Patient remains full code and full scope. Patient's goals are to have an improved quality of life and not be a burden to his loved ones. Discussed the importance of continued conversation with family and the medical providers regarding overall plan of care and treatment options, ensuring decisions are within the context of the patient's values and GOCs.  Questions and concerns were addressed. Hard Choices booklet given to daughter to review. The patient and family were encouraged to call with questions or concerns. PMT will continue to support holistically.  Recommendations/Plan: Full code Full scope Evaluation for LVAD Continued PMT support  Goals of Care and Additional Recommendations: Limitations on Scope of Treatment: Full Scope Treatment  Code Status:    Code Status Orders  (From admission, onward)           Start     Ordered   08/10/22 1822  Full code  Continuous       Question:  By:  Answer:  Consent: discussion documented in EHR   08/10/22 1822  Care plan was discussed with bedside RN  Detailed review of medical records (labs, imaging, vital signs), medically appropriate exam, discussed with treatment team, counseling and education to the patient, documenting clinical information, and  coordination of care.    Time spent: 35 minutes plus additional 60 minutes  Thank you for allowing the Palliative Medicine Team to assist in the care of this patient.   Sherryll Burger, NP  Please contact Palliative Medicine Team phone at [phone] for questions and concerns.

## 2022-08-13 NOTE — Progress Notes (Signed)
Advanced Heart Failure Rounding Note  PCP-Cardiologist: Norman Herrlich, MD   Subjective:   Admitted with A/C HFrEF. Taken to cath lab. Elevated filling pressures + cardiogenic shock, cardiac index 1.4. Started on milrinone + NE. Given 80 mg IV lasix.    - negative 5L over the past 24h, mixed venous 60 today.  - No complaints, doing better. CVP 3-5  Objective:   Weight Range: 72.3 kg Body mass index is 24.96 kg/m.   Vital Signs:   Temp:  [97.6 F (36.4 C)-98.3 F (36.8 C)] 97.9 F (36.6 C) (07/28 0735) Pulse Rate:  [78-99] 90 (07/28 1000) Resp:  [15-25] 15 (07/28 1000) BP: (88-107)/(61-78) 92/65 (07/28 1000) SpO2:  [88 %-95 %] 88 % (07/28 1000) Weight:  [72.3 kg] 72.3 kg (07/28 0500) Last BM Date : 08/12/22  Weight change: Filed Weights   08/11/22 0403 08/12/22 0600 08/13/22 0500  Weight: 75.3 kg 74.6 kg 72.3 kg    Intake/Output:   Intake/Output Summary (Last 24 hours) at 08/13/2022 1042 Last data filed at 08/13/2022 1000 Gross per 24 hour  Intake 1018.31 ml  Output 4700 ml  Net -3681.69 ml    CVP 3-5  Physical Exam    General: Sitting in the chair.  No resp difficulty HEENT: Normal Neck: Supple. JVP <5 . Carotids 2+ bilat; no bruits. No lymphadenopathy or thyromegaly appreciated. RIJ Cor: PMI nondisplaced. Regular rate & rhythm. No rubs, gallops or murmurs. Lungs: Clear Abdomen: Soft, nontender, nondistended. No hepatosplenomegaly. No bruits or masses. Good bowel sounds. Extremities: No cyanosis, clubbing, rash, R and LLE 1-2edema Neuro: Alert & orientedx3, cranial nerves grossly intact. moves all 4 extremities w/o difficulty. Affect pleasant   Telemetry   V paced 80s   EKG    N/A   Labs    CBC Recent Labs    08/11/22 1416 08/12/22 0458 08/13/22 0443  WBC 10.4 9.4 7.9  NEUTROABS 8.2*  --   --   HGB 11.9* 12.0* 11.9*  HCT 35.9* 37.3* 37.4*  MCV 104.4* 106.0* 105.6*  PLT 181 208 176   Basic Metabolic Panel Recent Labs    16/10/96 2041  08/11/22 0344 08/11/22 1416 08/12/22 0458 08/13/22 0443  NA  --  137  --  136 133*  K  --  3.3*   < > 3.8 3.8  CL  --  88*  --  93* 90*  CO2  --  33*  --  34* 32  GLUCOSE  --  106*  --  97 121*  BUN  --  27*  --  20 20  CREATININE  --  1.35*  --  1.15 1.16  CALCIUM  --  8.8*  --  8.5* 8.8*  MG 2.0 2.2  --   --   --   PHOS 4.4  --   --   --   --    < > = values in this interval not displayed.   Liver Function Tests Recent Labs    08/10/22 1142  AST 61*  ALT 77*  ALKPHOS 77  BILITOT 3.4*  PROT 6.9  ALBUMIN 3.8   No results for input(s): "LIPASE", "AMYLASE" in the last 72 hours. Cardiac Enzymes No results for input(s): "CKTOTAL", "CKMB", "CKMBINDEX", "TROPONINI" in the last 72 hours.  BNP: BNP (last 3 results) Recent Labs    07/11/22 0209 07/26/22 1211 08/10/22 1142  BNP 1,588.2* 1,723.6* 1,905.0*    D-Dimer No results for input(s): "DDIMER" in the last 72 hours. Hemoglobin A1C  Recent Labs    08/11/22 1530  HGBA1C 5.5   Fasting Lipid Panel Recent Labs    08/11/22 1416  CHOL 90  HDL 26*  LDLCALC 51  TRIG 64  CHOLHDL 3.5   Thyroid Function Tests Recent Labs    08/11/22 1416  TSH 1.211    Other results:   Imaging    No results found.   Medications:     Scheduled Medications:  atorvastatin  80 mg Oral Daily   chlorhexidine  15 mL Mouth/Throat BID   Chlorhexidine Gluconate Cloth  6 each Topical Daily   digoxin  0.125 mg Oral Daily   dorzolamide-timolol  1 drop Both Eyes BID   feeding supplement  237 mL Oral BID BM   folic acid  1 mg Oral Daily   latanoprost  1 drop Both Eyes QHS   methotrexate  20 mg Oral Weekly   mexiletine  250 mg Oral BID   mometasone-formoterol  2 puff Inhalation BID   multivitamin with minerals  1 tablet Oral Daily   pantoprazole  40 mg Oral QAC breakfast   sodium chloride flush  10-40 mL Intracatheter Q12H   sodium chloride flush  10-40 mL Intracatheter Q12H   sodium chloride flush  3 mL Intravenous Q12H    sodium chloride flush  3 mL Intravenous Q12H    Infusions:  sodium chloride Stopped (08/11/22 0622)   sodium chloride Stopped (08/12/22 1638)   furosemide (LASIX) 200 mg in dextrose 5 % 100 mL (2 mg/mL) infusion 10 mg/hr (08/13/22 1000)   heparin 1,200 Units/hr (08/13/22 1000)   milrinone 0.25 mcg/kg/min (08/13/22 1000)   norepinephrine (LEVOPHED) Adult infusion Stopped (08/12/22 1339)    PRN Medications: sodium chloride, sodium chloride, acetaminophen, ondansetron (ZOFRAN) IV, mouth rinse, sodium chloride flush, sodium chloride flush, sodium chloride flush, sodium chloride flush    Patient Profile   Mr Mcelwee is a 57 y.o. 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   Assessment/Plan  1.  Acute on chronic Systolic HF-->Cardiogenic Shock  - Diagnosed 11/2019. Presented with VT. LHC 70% LAD  -- cMRI 12/21 concerning for sarcoid and EF 18%.  - PET 2/22 at [facility] EF 25% + active sarcoid - Echo 08/26/20 EF < 20% severely dilated LV RV mildly decreased.  - CPX 8/22. Unable to complete PFTs due to hypoventilation and poor effort. pVO2 16.1 (49% predicted). Slope 19 RER: 1.17 PETCO2 59 - [employer] CRT-D upgrade in 06/08/21 - Echo 11/07/21: EF 20-25% RV mild to moderately reduced.  - Echo 07/10/22: EF <20%, RV okay, mod pericardial effusion, mod MR/TR - RHC 6/28 showed mean RA 9, PA 58/26, mean PCWP 30, CI 2.6>>diuresed w/ IV Lasix  Optivol - Fluid index trending up and thoracic impedance down. Activity trending down to 0.5 hours.  - On admit taken to the cath lab. Elevated filling pressures and lowoutput.  - Continue digoxin 0.125 mcg daily.  - VAD coordinators aware, starting formal evaluation.  - PICC line placed yesterday; mixed venous 60 today after 5L urine output yesterday on lasix 10mg /hr & milrinone 0.75mcg/kg/min.  - Plan for PFTs tomorrow. D/C lasix gtt today. Start PO lasix 40mg  daily. CVP 3-5 today.    2.  Hx of stroke -Admitted with  acute L sided weakness 6/24, CTA revealed an acute right M1 occlusion. S/p TPA and mechanical clot extraction. No residual deficits  -Likely cardioembolic in setting of severe LV dysfunction. Paroxsymal AT/AF also noted on  device interrogation. EKG today A sensed- v paced w/ underlying rhythm sinus.  - Off eliquis  - Continue heparin drip until all procedures completed.  - VAD coordinators to see today. Start VAD work up.    3. 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 Continue mexilitene  - now s/p ICD. No VT on interrogation.    4. CAD - LHC 12/07/19 70-% LAD, no intervention - LHC mild non obstructive CAD.  - Continue statin and aspirin.    5. Cardiac sarcoid - PET 2/22 at [facility] EF 25% + active sarcoid - Has completed prednisone.  - Takes MTX 20 weekly, Saturday. Restart folic acid.  - Previously discussed EM biopsy but patient is reluctant  - Needs repeat PET, wasn't able to schedule 10/23 as insurance would not cover. Now has medicaid and medicare. 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  - on Eliquis for recent CVA, will switch to heparin gtt   7. AKI - Creatinine baseline around 1.1, now at baseline.  - suspect cardiorenal - monitor with diuresis   8. Iron deficiency anemia - recent T sat 15%, scheduled for OP feraheme tomorrow. Will complete inpatient after workup complete.      Length of Stay: 3  Arwa Yero, DO  08/13/2022, 10:42 AM  Advanced Heart Failure Team Pager [phone] (M-F; 7a - 5p)  Please contact [facility] for night-coverage after hours (5p -7a ) and weekends on [email or web address]  CRITICAL CARE Performed by: Dorthula Nettles   Total critical care time: 32 minutes  Critical care time was exclusive of separately billable procedures and treating other patients.  Critical care was necessary to treat or prevent imminent or life-threatening deterioration.  Critical care was  time spent personally by me on the following activities: development of treatment plan with patient and/or surrogate as well as nursing, discussions with consultants, evaluation of patient's response to treatment, examination of patient, obtaining history from patient or surrogate, ordering and performing treatments and interventions, ordering and review of laboratory studies, ordering and review of radiographic studies, pulse oximetry and re-evaluation of patient's condition.

## 2022-08-13 NOTE — Evaluation (Signed)
Physical Therapy Evaluation Patient Details Name: Charles Holmes MRN: [id number] DOB: Oct 04, 1965 Today's Date: 08/13/2022  History of Present Illness  Pt is 57 year old presented to [facility] on  08/10/22 for acute on chronic systolic heart failure. Pt being evaluated for LVAD. PMH - CAD, CHF, HLD, HTN, systolic HF, ICD implant, rt CVA  Clinical Impression  Pt presents to PT with decr activity tolerance due to heart failure and is being considered for LVAD. Will work to increase power, strength, and endurance in anticipation of possible LVAD. Also began education about sternal precautions if he has an LVAD.         Assistance Recommended at Discharge PRN  If plan is discharge home, recommend the following:  Can travel by private vehicle           Equipment Recommendations None recommended by PT  Recommendations for Other Services       Functional Status Assessment Patient has had a recent decline in their functional status and demonstrates the ability to make significant improvements in function in a reasonable and predictable amount of time.     Precautions / Restrictions Precautions Precautions: None Restrictions Weight Bearing Restrictions: No      Mobility  Bed Mobility Overal bed mobility: Modified Independent                  Transfers Overall transfer level: Modified independent Equipment used: None               General transfer comment: Able to perform without use of UE's    Ambulation/Gait Ambulation/Gait assistance: Supervision Gait Distance (Feet): 650 Feet Assistive device: None Gait Pattern/deviations: Decreased stride length Gait velocity: normal Gait velocity interpretation: >2.62 ft/sec, indicative of community ambulatory   General Gait Details: Steady gait with evidence of general fatigue with incr distance  Stairs            Wheelchair Mobility     Tilt Bed    Modified Rankin (Stroke Patients Only)       Balance  Overall balance assessment: No apparent balance deficits (not formally assessed)                                           Pertinent Vitals/Pain Pain Assessment Pain Assessment: No/denies pain    Home Living Family/patient expects to be discharged to:: Private residence Living Arrangements: Alone Available Help at Discharge: Available PRN/intermittently Type of Home: Mobile home Home Access: Stairs to enter Entrance Stairs-Rails: Doctor, general practice of Steps: 2   Home Layout: One level Home Equipment: None Additional Comments: If pt has LVAD he will go stay with cousins initially in 1 level home.    Prior Function Prior Level of Function : Independent/Modified Independent;Driving                     Hand Dominance   Dominant Hand: Right    Extremity/Trunk Assessment   Upper Extremity Assessment Upper Extremity Assessment: Overall WFL for tasks assessed    Lower Extremity Assessment Lower Extremity Assessment: Generalized weakness       Communication   Communication: No difficulties  Cognition Arousal/Alertness: Awake/alert Behavior During Therapy: WFL for tasks assessed/performed Overall Cognitive Status: Within Functional Limits for tasks assessed  General Comments General comments (skin integrity, edema, etc.): VSS on RA    Exercises Other Exercises Other Exercises: Repeated sit to stand x 10   Assessment/Plan    PT Assessment Patient needs continued PT services  PT Problem List Decreased strength;Decreased activity tolerance       PT Treatment Interventions DME instruction;Functional mobility training;Gait training;Therapeutic exercise;Patient/family education    PT Goals (Current goals can be found in the Care Plan section)  Acute Rehab PT Goals Patient Stated Goal: Consider getting LVAD PT Goal Formulation: With patient Time For Goal Achievement:  08/27/22 Potential to Achieve Goals: Good    Frequency Min 1X/week     Co-evaluation               AM-PAC PT "6 Clicks" Mobility  Outcome Measure Help needed turning from your back to your side while in a flat bed without using bedrails?: None Help needed moving from lying on your back to sitting on the side of a flat bed without using bedrails?: None Help needed moving to and from a bed to a chair (including a wheelchair)?: None Help needed standing up from a chair using your arms (e.g., wheelchair or bedside chair)?: None Help needed to walk in hospital room?: None Help needed climbing 3-5 steps with a railing? : A Little 6 Click Score: 23    End of Session   Activity Tolerance: Patient tolerated treatment well Patient left: in bed;with call bell/phone within reach Nurse Communication: Mobility status PT Visit Diagnosis: Muscle weakness (generalized) (M62.81)    Time: 1610-9604 PT Time Calculation (min) (ACUTE ONLY): 14 min   Charges:   PT Evaluation $PT Eval Low Complexity: 1 Low   PT General Charges $$ ACUTE PT VISIT: 1 Visit         [facility] PT Acute Rehabilitation Services Office [phone]   [name] [facility] 08/13/2022, 10:41 AM

## 2022-08-13 NOTE — Progress Notes (Signed)
Progress Note   Patient: Charles Holmes:[id number] DOB: 08-02-1965 DOA: 08/10/2022     3 DOS: the patient was seen and examined on 08/13/2022   Brief hospital course: Charles Holmes was admitted to the hospital with the working diagnosis of heart failure decompensation.   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 For the last 12 days he developed worsening dyspnea, generalized fatigue and weakness. He was instructed to increase his furosemide dose to bid, with no significant improvement in his symptoms. On the day of admission he was evaluated at the heart failure outpatient clinic, he was found with worsening heart failure and was referred to the ED for further evaluation.  On his initial physical examination his blood pressure was 116/84, HR 87, RR 18 and 02 saturation 94%, positive JVD, lungs with bilateral rales, and increased work of breathing, heart with S1 and S2 present and regular, with no gallops or murmurs, abdomen with no distention and positive lower extremity edema ++.   Chest radiograph with cardiomegaly, bilateral hilar vascular congestion with cephalization of the vasculature, bilateral interstitial infiltrates, symmetric and central. No pleural effusions. Pace maker defibrillator in place with one right atrial lead, and biventricular leads.   EKG 79 bpm, left axis deviation, left anterior fascicular block, right bundle branch block, ventricular paced rhythm with no significant ST segment or T wave changes.   Patient was placed on IV furosemide for diuresis. 07/25 right heart catheterization, with low cardiac output and high filling pressures, consistent with cardiogenic shock.  Started on inotropic support, milrinone and norepinephrine  infusions.  07/26 consulted CT surgery for possible LVAT.   07/27 discontinued furosemide drip.  Change level of care to progressive.   Assessment and Plan: * Acute on chronic systolic CHF (congestive heart failure) (HCC) Echocardiogram with reduced LV systolic function <20%, severe dilatation of LV cavity, no LVH, LV inferior wall septum akinetic, hypokinetic entire anterior wall, entire lateral wall, anterior septum, entire inferior wall, and apex. RV systolic function preserved, RVSP 59,0 mmHg.  LA with mild dilatation, moderate pericardial effusion, localized near the right atrium and anterior to the right ventricle with no signs of tamponade, moderate mitral valve regurgitation, moderate TR.   07/25 cardiac catheterization  PA 80/42 mean 57 PCWP 47 Cardiac output 2.7 and index 1,4 (Fick). PVR 3,0 WU  Cardiogenic shock.  Pre and post capillary pulmonary hypertension.   Urine output 5,050  ml Systolic blood pressure 92 to 99  mmHg.   Norepinephrine has been discontinued.  On milrinone 0,25  Continue digoxin.  Furosemide has been transitioned to oral 40 mg.   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  Pending PFT in preparation for possible LVAT.   CAD (coronary artery disease) Coronary angiography with proximal Cx 20 % stenosis, mid LAD 50% stenosis. Continue with atorvastatin.     HTN (hypertension) Continue blood pressure support with milrinone.   Ventricular tachycardia (HCC) Patient on mexiletine.  Continue telemetry monitoring. Keep K at 4 and Mg at 2.   Cardiac sarcoidosis Continue with methotrexate.   AKI (acute kidney injury) (HCC) Hypokalemia.   Stable renal function with serum cr at 1,1 with K at 3,8 and serum bicarbonate at 32. Na 133.   Plan to continue hemodynamic support and diuresis. Continue renal function monitoring, bmp in am.  History of CVA (cerebrovascular accident) Continue with statin therapy.  Patient on heparin IV for  anticoagulation in preparation for possible invasive cardiac intervention.   Paroxysmal atrial fibrillation (HCC) Continue telemetry monitoring.  Anticoagulation with heparin IV for now.   Iron deficiency anemia Continue iron supplementation.         Subjective: Patient is feeling better, no chest pain, dyspnea and edema continue to improve.   Physical Exam: Vitals:   08/13/22 1100 08/13/22 1155 08/13/22 1200 08/13/22 1300  BP: 90/69  98/69 99/70  Pulse: (!) 102  97 98  Resp: (!) 23  19 20   Temp:  98.2 F (36.8 C)    TempSrc:  Oral    SpO2: 92%  90% 90%  Weight:      Height:       Neurology awake and alert ENT with mild pallor Cardiovascular with S1 and S2 present and rhythmic, no gallops, rubs or murmurs No JVD No lower extremity edema Respiratory with no rales or wheezing, no rhonchi Abdomen with no distention  Data Reviewed:    Family Communication: no family at the bedside   Disposition: Status is: Inpatient Remains inpatient appropriate because: inotropic support, transfer to progressive care.   Planned Discharge Destination: Home     Author: Coralie Keens, MD 08/13/2022 2:04 PM  For on call review [email or web address].

## 2022-08-14 ENCOUNTER — Inpatient Hospital Stay (HOSPITAL_COMMUNITY): Payer: Medicare Other

## 2022-08-14 DIAGNOSIS — I5023 Acute on chronic systolic (congestive) heart failure: Secondary | ICD-10-CM | POA: Diagnosis not present

## 2022-08-14 DIAGNOSIS — I25118 Atherosclerotic heart disease of native coronary artery with other forms of angina pectoris: Secondary | ICD-10-CM | POA: Diagnosis not present

## 2022-08-14 DIAGNOSIS — D8685 Sarcoid myocarditis: Secondary | ICD-10-CM | POA: Diagnosis not present

## 2022-08-14 DIAGNOSIS — I472 Ventricular tachycardia, unspecified: Secondary | ICD-10-CM | POA: Diagnosis not present

## 2022-08-14 LAB — PULMONARY FUNCTION TEST
DL/VA % pred: 88 %
DL/VA: 3.83 ml/min/mmHg/L
DLCO cor % pred: 66 %
DLCO cor: 16.79 ml/min/mmHg
DLCO unc % pred: 60 %
DLCO unc: 15.35 ml/min/mmHg
FEF 25-75 Post: 0.65 L/sec
FEF 25-75 Pre: 0.41 L/sec
FEF2575-%Change-Post: 59 %
FEF2575-%Pred-Post: 23 %
FEF2575-%Pred-Pre: 14 %
FEV1-%Change-Post: 19 %
FEV1-%Pred-Post: 38 %
FEV1-%Pred-Pre: 31 %
FEV1-Post: 1.25 L
FEV1-Pre: 1.04 L
FEV1FVC-%Change-Post: -3 %
FEV1FVC-%Pred-Pre: 63 %
FEV6-%Change-Post: 17 %
FEV6-%Pred-Post: 57 %
FEV6-%Pred-Pre: 48 %
FEV6-Post: 2.34 L
FEV6-Pre: 2 L
FEV6FVC-%Change-Post: -7 %
FEV6FVC-%Pred-Post: 91 %
FEV6FVC-%Pred-Pre: 98 %
FVC-%Change-Post: 24 %
FVC-%Pred-Post: 62 %
FVC-%Pred-Pre: 50 %
FVC-Post: 2.71 L
FVC-Pre: 2.17 L
Post FEV1/FVC ratio: 46 %
Post FEV6/FVC ratio: 87 %
Pre FEV1/FVC ratio: 48 %
Pre FEV6/FVC Ratio: 94 %
RV % pred: 150 %
RV: 3.1 L
TLC % pred: 88 %
TLC: 5.65 L

## 2022-08-14 LAB — COOXEMETRY PANEL
Carboxyhemoglobin: 1.8 % — ABNORMAL HIGH (ref 0.5–1.5)
Methemoglobin: <0.7 % (ref 0.0–1.5)
O2 Saturation: 54.9 %
Total hemoglobin: 13 g/dL (ref 12.0–16.0)

## 2022-08-14 MED ORDER — ALBUTEROL SULFATE (2.5 MG/3ML) 0.083% IN NEBU
2.5000 mg | INHALATION_SOLUTION | Freq: Once | RESPIRATORY_TRACT | Status: AC
  Administered 2022-08-14: 2.5 mg via RESPIRATORY_TRACT

## 2022-08-14 NOTE — Progress Notes (Signed)
Advanced Heart Failure Rounding Note  PCP-Cardiologist: Norman Herrlich, MD   Subjective:    07/25: Admit with cardiogenic shock. Started milrinone and NE.  CO-OX 55% on Milrinone 0.25. NE off.  CVP 3   Feeling well. No dyspnea. Sitting up in chair.   R/LHC 07/25 Findings: Ao = 98/73 (79) LV = 101/39 RA = 19 RV = 76/21 PA = 80/42 (57) PCW = 47 (v = 55) Fick cardiac output/index = 2.7/1.4 PVR = 3.0 WU Ao sat = 96% PA sat = 41%, 39% PAPi = 2.0   Assessment: 1. Mild non-obstructive CAD 2. Severe NICM with severely elevated filling pressures and low output state consistent with cardiogenic shock  Objective:   Weight Range: 72.9 kg Body mass index is 25.17 kg/m.   Vital Signs:   Temp:  [97.6 F (36.4 C)-98.3 F (36.8 C)] 98.3 F (36.8 C) (07/29 0756) Pulse Rate:  [82-111] 97 (07/28 2130) Resp:  [14-28] 14 (07/29 0700) BP: (83-107)/(60-78) 95/62 (07/29 0700) SpO2:  [88 %-95 %] 92 % (07/28 2130) Weight:  [72.9 kg] 72.9 kg (07/29 0600) Last BM Date : 08/13/22  Weight change: Filed Weights   08/12/22 0600 08/13/22 0500 08/14/22 0600  Weight: 74.6 kg 72.3 kg 72.9 kg    Intake/Output:   Intake/Output Summary (Last 24 hours) at 08/14/2022 0812 Last data filed at 08/14/2022 0700 Gross per 24 hour  Intake 786.68 ml  Output 1600 ml  Net -813.32 ml     CVP 3 Physical Exam    General:  Well appearing. Sitting up in chair. HEENT: normal Neck: supple. no JVD. Carotids 2+ bilat; no bruits. No lymphadenopathy or thryomegaly appreciated. Cor: PMI nondisplaced. Regular rate & rhythm. No rubs, gallops or murmurs. Lungs: clear Abdomen: soft, nontender, nondistended.  Extremities: no cyanosis, clubbing, rash, edema, + R brachial PICC Neuro: alert & orientedx3. Affect pleasant    Telemetry   V paced 80s  EKG    N/A   Labs    CBC Recent Labs    08/11/22 1416 08/12/22 0458 08/13/22 0443 08/14/22 0208  WBC 10.4   < > 7.9 7.5  NEUTROABS 8.2*  --    --   --   HGB 11.9*   < > 11.9* 11.9*  HCT 35.9*   < > 37.4* 37.2*  MCV 104.4*   < > 105.6* 105.1*  PLT 181   < > 176 176   < > = values in this interval not displayed.   Basic Metabolic Panel Recent Labs    16/10/96 0443 08/14/22 0208  NA 133* 134*  K 3.8 4.0  CL 90* 93*  CO2 32 32  GLUCOSE 121* 95  BUN 20 23*  CREATININE 1.16 1.13  CALCIUM 8.8* 8.8*  MG  --  2.1   Liver Function Tests No results for input(s): "AST", "ALT", "ALKPHOS", "BILITOT", "PROT", "ALBUMIN" in the last 72 hours.  No results for input(s): "LIPASE", "AMYLASE" in the last 72 hours. Cardiac Enzymes No results for input(s): "CKTOTAL", "CKMB", "CKMBINDEX", "TROPONINI" in the last 72 hours.  BNP: BNP (last 3 results) Recent Labs    07/11/22 0209 07/26/22 1211 08/10/22 1142  BNP 1,588.2* 1,723.6* 1,905.0*    D-Dimer No results for input(s): "DDIMER" in the last 72 hours. Hemoglobin A1C Recent Labs    08/11/22 1530  HGBA1C 5.5   Fasting Lipid Panel Recent Labs    08/11/22 1416  CHOL 90  HDL 26*  LDLCALC 51  TRIG 64  CHOLHDL 3.5  Thyroid Function Tests Recent Labs    08/11/22 1416  TSH 1.211    Other results:   Imaging    No results found.   Medications:     Scheduled Medications:  atorvastatin  80 mg Oral Daily   chlorhexidine  15 mL Mouth/Throat BID   Chlorhexidine Gluconate Cloth  6 each Topical Daily   digoxin  0.125 mg Oral Daily   dorzolamide-timolol  1 drop Both Eyes BID   feeding supplement  237 mL Oral BID BM   folic acid  1 mg Oral Daily   furosemide  40 mg Oral Daily   latanoprost  1 drop Both Eyes QHS   methotrexate  20 mg Oral Weekly   mexiletine  250 mg Oral BID   mometasone-formoterol  2 puff Inhalation BID   multivitamin with minerals  1 tablet Oral Daily   pantoprazole  40 mg Oral QAC breakfast   sodium chloride flush  10-40 mL Intracatheter Q12H   sodium chloride flush  10-40 mL Intracatheter Q12H   sodium chloride flush  3 mL Intravenous  Q12H   sodium chloride flush  3 mL Intravenous Q12H    Infusions:  sodium chloride Stopped (08/11/22 0622)   sodium chloride Stopped (08/12/22 1638)   heparin 1,100 Units/hr (08/14/22 0700)   milrinone 0.25 mcg/kg/min (08/14/22 0700)    PRN Medications: sodium chloride, sodium chloride, acetaminophen, ondansetron (ZOFRAN) IV, mouth rinse, sodium chloride flush, sodium chloride flush, sodium chloride flush, sodium chloride flush    Patient Profile   Mr Ponds is a 57 y.o. 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   Assessment/Plan  1.  Acute on chronic Systolic HF-->Cardiogenic Shock  - Diagnosed 11/2019. Presented with VT. LHC 70% LAD  -- cMRI 12/21 concerning for sarcoid and EF 18%.  - PET 2/22 at [facility] EF 25% + active sarcoid - Echo 08/26/20 EF < 20% severely dilated LV RV mildly decreased.  - CPX 8/22. Unable to complete PFTs due to hypoventilation and poor effort. pVO2 16.1 (49% predicted). Slope 19 RER: 1.17 PETCO2 59 - Med[employer] CRT-D upgrade in 06/08/21 - Echo 11/07/21: EF 20-25% RV mild to moderately reduced.  - Echo 07/10/22: EF <20%, RV okay, mod pericardial effusion, mod MR/TR - RHC 6/28 mean RA 9, PA 58/26, mean PCWP 30, CI 2.6 - Admitted 07/25 with cardiogenic shock. - RHC: Nonobstructive CAD, severely elevated filling pressures and low Fick CO/CI (2.7/1.4) - CO-OX 55% on milrinone 0.25. Fick CO 2.0. Recheck CO-OX this afternoon. Wean milrinone as able - CVP 3. Continue PO lasix. - Continue digoxin 0.125 mcg daily.  - GDMT as tolerated. BP has been soft - VAD workup underway. Will discuss in MRB this afternoon. - Plan for PFTs today   2.  Hx of stroke -Admitted with acute L sided weakness 6/24, CTA revealed an acute right M1 occlusion. S/p TPA and mechanical clot extraction. No residual deficits  -Likely cardioembolic in setting of severe LV dysfunction. 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 EKG today A  sensed- v paced w/ underlying rhythm sinus.  - Off eliquis  - Continue heparin drip until all procedures completed.  - VAD coordinators to see. Start VAD work up.    3. 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 Continue mexilitene  - now s/p ICD. No VT on interrogation.    4. CAD - LHC 12/07/19 70-% LAD, no  intervention - LHC mild non obstructive CAD.  - Continue statin and aspirin.    5. Cardiac sarcoid - PET 2/22 at [facility] EF 25% + active sarcoid - Has completed prednisone.  - Takes MTX 20 weekly, Saturday. Restart folic acid.  - Previously discussed EM biopsy but patient is reluctant  - Needs repeat PET, wasn't able to schedule 10/23 as insurance would not cover. Now has medicaid and medicare. 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  - Heparin gtt   7. AKI - Creatinine baseline around 1.1, Scr up to 1.6. Now at baseline.  - suspect cardiorenal   8. Iron deficiency anemia - recent T sat 15%, scheduled for OP feraheme. Will complete inpatient after workup complete.     Can transfer to cardiac stepdown unit.  Length of Stay: 4  Rahma Meller N, PA-C  08/14/2022, 8:12 AM  Advanced Heart Failure Team Pager [phone] (M-F; 7a - 5p)  Please contact [facility] for night-coverage after hours (5p -7a ) and weekends on [email or web address]

## 2022-08-14 NOTE — Progress Notes (Incomplete)
Patient ID: TESFA OBRIANT, male   DOB: 1965/09/03, 57 y.o.   MRN: [id number]    Progress Note from the Palliative Medicine Team at [facility]   Patient Name: Charles Holmes        Date: 08/14/2022 DOB: 03-Feb-1965  Age: 57 y.o. MRN#: [id number] Attending Physician: Coralie Keens Primary Care Physician: [name], PA-C Admit Date: 08/10/2022    Extensive chart review has been completed prior to meeting with patient/family  including labs, vital signs, imaging, progress/consult notes, orders, medications and available advance directive documents.    This NP assessed patient at the bedside as a follow up to  yesterday's GOCs meeting.        Education offered today regarding  the importance of continued conversation with family and their  medical providers regarding overall plan of care and treatment options,  ensuring decisions are within the context of the patients values and GOCs.  Questions and concerns addressed   Discussed with Dr   Time:  Detailed review of medical records ( labs, imaging, vital signs), medically appropriate exam ( MS, skin, resp)   discussed with treatment team, counseling and education to patient, family, staff, documenting clinical information, medication management, coordination of care    Lorinda Creed NP  Palliative Medicine Team Team Phone # [phone] Pager [phone]

## 2022-08-14 NOTE — Progress Notes (Signed)
ANTICOAGULATION CONSULT NOTE  Pharmacy Consult for Heparin (Eliquis on hold) Indication: atrial fibrillation  Allergies  Allergen Reactions   Amiodarone     Severe tremors   Percocet [Oxycodone-Acetaminophen] Itching    Patient Measurements: Height: 5\' 7"  (170.2 cm) Weight: 72.9 kg (160 lb 11.5 oz) IBW/kg (Calculated) : 66.1 Heparin Dosing Weight: total weight  Vital Signs: Temp: 98.3 F (36.8 C) (07/29 0756) Temp Source: Oral (07/29 0756) BP: 102/62 (07/29 0800)  Labs: Recent Labs    08/11/22 1100 08/11/22 1416 08/12/22 0458 08/13/22 0443 08/14/22 0208  HGB  --    < > 12.0* 11.9* 11.9*  HCT  --    < > 37.3* 37.4* 37.2*  PLT  --    < > 208 176 176  APTT 48*   < > 91* 106* 64*  LABPROT 19.7*  --   --   --   --   INR 1.7*  --   --   --   --   HEPARINUNFRC >1.10*  --  >1.10* 1.02* 0.45  CREATININE  --   --  1.15 1.16 1.13   < > = values in this interval not displayed.    Estimated Creatinine Clearance: 65.8 mL/min (by C-G formula based on SCr of 1.13 mg/dL).   Medical History: Past Medical History:  Diagnosis Date   CAD (coronary artery disease)    CHF (congestive heart failure) (HCC)    GERD (gastroesophageal reflux disease)    Hyperlipidemia    Hypertension    Systolic heart failure (HCC) 2021   LVEF 18%, RVEF 38% on cardiac MRI 12/19/2019. possible cardiac sarcoidosis.   Wide-complex tachycardia 2021   wears LifeVest    Medications:  Scheduled:   atorvastatin  80 mg Oral Daily   chlorhexidine  15 mL Mouth/Throat BID   Chlorhexidine Gluconate Cloth  6 each Topical Daily   digoxin  0.125 mg Oral Daily   dorzolamide-timolol  1 drop Both Eyes BID   feeding supplement  237 mL Oral BID BM   folic acid  1 mg Oral Daily   furosemide  40 mg Oral Daily   latanoprost  1 drop Both Eyes QHS   methotrexate  20 mg Oral Weekly   mexiletine  250 mg Oral BID   mometasone-formoterol  2 puff Inhalation BID   multivitamin with minerals  1 tablet Oral Daily    pantoprazole  40 mg Oral QAC breakfast   sodium chloride flush  10-40 mL Intracatheter Q12H   sodium chloride flush  10-40 mL Intracatheter Q12H   sodium chloride flush  3 mL Intravenous Q12H   sodium chloride flush  3 mL Intravenous Q12H   Infusions:   sodium chloride Stopped (08/11/22 0622)   sodium chloride Stopped (08/12/22 1638)   heparin 1,100 Units/hr (08/14/22 0800)   milrinone 0.25 mcg/kg/min (08/14/22 0800)   PRN: sodium chloride, sodium chloride, acetaminophen, ondansetron (ZOFRAN) IV, mouth rinse, sodium chloride flush, sodium chloride flush, sodium chloride flush, sodium chloride flush  Assessment: 57 yo male on apixaban for afib, recent CVA felt to be cardioembolic. Presents for cath, work up for LVAD. Last dose of apixaban 7/25 at 07:30, Hgb 11.9, Plts WNL,   Pharmacy consulted to transition apixaban to heparin.  aPTT at goal 64 seconds with heaprin level 0.45 also at goal and correlating CBC stable, no bleeding  Stop aptt will dose heparin via heparin levels moving forward  Goal of Therapy:  Heparin level 0.3-0.7 units/ml Monitor platelets by anticoagulation protocol: Yes  Plan:  Continue heparin 1100 units/h Daily  heparin level, CBC Monitor s/s bleeding     Leota Sauers Pharm.D. CPP, BCPS Clinical Pharmacist [phone] 08/14/2022 9:43 AM   Please check AMION for all [facility] numbers 08/14/2022

## 2022-08-14 NOTE — Progress Notes (Signed)
Progress Note   Patient: Charles Holmes WGN:[id number] DOB: [birth year] DOA: 08/10/2022     4 DOS: the patient was seen and examined on 08/14/2022   Brief hospital course: Charles Holmes was admitted to the hospital with the working diagnosis of heart failure decompensation.   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 For the last 12 days he developed worsening dyspnea, generalized fatigue and weakness. He was instructed to increase his furosemide dose to bid, with no significant improvement in his symptoms. On the day of admission he was evaluated at the heart failure outpatient clinic, he was found with worsening heart failure and was referred to the ED for further evaluation.  On his initial physical examination his blood pressure was 116/84, HR 87, RR 18 and 02 saturation 94%, positive JVD, lungs with bilateral rales, and increased work of breathing, heart with S1 and S2 present and regular, with no gallops or murmurs, abdomen with no distention and positive lower extremity edema ++.   Chest radiograph with cardiomegaly, bilateral hilar vascular congestion with cephalization of the vasculature, bilateral interstitial infiltrates, symmetric and central. No pleural effusions. Pace maker defibrillator in place with one right atrial lead, and biventricular leads.   EKG 79 bpm, left axis deviation, left anterior fascicular block, right bundle branch block, ventricular paced rhythm with no significant ST segment or T wave changes.   Patient was placed on IV furosemide for diuresis. 07/25 right heart catheterization, with low cardiac output and high filling pressures, consistent with cardiogenic shock.  Started on inotropic support, milrinone and norepinephrine  infusions.  07/26 consulted CT surgery for possible LVAT.   07/27 discontinued furosemide drip.  Change level of care to progressive.   Assessment and Plan: * Acute on chronic systolic CHF (congestive heart failure) (HCC) Echocardiogram with reduced LV systolic function <20%, severe dilatation of LV cavity, no LVH, LV inferior wall septum akinetic, hypokinetic entire anterior wall, entire lateral wall, anterior septum, entire inferior wall, and apex. RV systolic function preserved, RVSP 59,0 mmHg.  LA with mild dilatation, moderate pericardial effusion, localized near the right atrium and anterior to the right ventricle with no signs of tamponade, moderate mitral valve regurgitation, moderate TR.   07/25 cardiac catheterization  PA 80/42 mean 57 PCWP 47 Cardiac output 2.7 and index 1,4 (Fick). PVR 3,0 WU  Recovering cardiogenic shock.  Pre and post capillary pulmonary hypertension.   Urine output 1,725  ml Systolic blood pressure 98 to 784  mmHg.   Off Norepinephrine  On milrinone 0,25 mcg/kg/min  Continue digoxin.  Furosemide oral 40 mg.   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  Pending PFT in preparation for possible LVAT.   CAD (coronary artery disease) Coronary angiography with proximal Cx 20 % stenosis, mid LAD 50% stenosis. Continue with atorvastatin.     HTN (hypertension) Continue blood pressure support with milrinone.   Ventricular tachycardia (HCC) Patient on mexiletine.  Continue telemetry monitoring. Keep K at 4 and Mg at 2.   Cardiac sarcoidosis Continue with methotrexate.   AKI (acute kidney injury) (HCC) Hypokalemia.   Renal function continue to be stable, AKI has resolved. Today serum cr is 1,13 with K at 4,0 and serum bicarbonate at 32. Na 134 and Mg 2,1   Plan to continue hemodynamic support and diuresis. Continue renal  function monitoring, bmp in am.    History of CVA (cerebrovascular accident) Continue with statin therapy.   Patient on heparin IV for anticoagulation in preparation for possible invasive cardiac intervention.   Paroxysmal atrial fibrillation (HCC) Continue telemetry with V pacing.  Anticoagulation with heparin IV for now.   Iron deficiency anemia Continue iron supplementation.         Subjective: Patient is feeling better, he was able to sleep well last night, this morning with no dyspnea or edema.   Physical Exam: Vitals:   08/14/22 0700 08/14/22 0756 08/14/22 0800 08/14/22 0815  BP: 95/62  102/62   Pulse:      Resp: 14  (!) 24 15  Temp:  98.3 F (36.8 C)    TempSrc:  Oral    SpO2:   94% 93%  Weight:      Height:       Neurology awake and alert ENT with mild pallor Cardiovascular with S1 and S2 present and regular with no gallops, rubs or murmurs No JVD No lower extremity edema Respiratory with no rales or wheezing, no rhonchi Abdomen with no distention  Data Reviewed:    Family Communication: no family at the bedside   Disposition: Status is: Inpatient Remains inpatient appropriate because: recovering cardiogenic shock   Planned Discharge Destination: Home  Author: Coralie Keens, MD 08/14/2022 8:57 AM  For on call review [email or web address].

## 2022-08-14 NOTE — Plan of Care (Signed)

## 2022-08-14 NOTE — Progress Notes (Signed)
Pt admitted to rm 23 from 2H. CHG wipe given. Imitated tele. VSS. Call bell within reach.   Lawson Radar, RN

## 2022-08-14 NOTE — Progress Notes (Signed)
Mobility Specialist Progress Note:   08/14/22 1638  Mobility  Activity Ambulated independently in hallway  Level of Assistance Standby assist, set-up cues, supervision of patient - no hands on  Assistive Device None  Distance Ambulated (ft) 500 ft  RUE Weight Bearing NWB  Activity Response Tolerated well  Mobility Referral Yes  $Mobility charge 1 Mobility  Mobility Specialist Start Time (ACUTE ONLY) 0420  Mobility Specialist Stop Time (ACUTE ONLY) 0430  Mobility Specialist Time Calculation (min) (ACUTE ONLY) 10 min    During Mobility: 76 HR  Post Mobility: 70 HR ,101/68 BP  Pt received in chair, agreeable to mobility. Denied any feelings of discomfort or SOB during ambulation, asymptomatic throughout. Pt returned to chair with call bell in hand and all needs met.  Leory Plowman  Mobility Specialist Please contact via [employer] office at [phone]

## 2022-08-15 DIAGNOSIS — D8685 Sarcoid myocarditis: Secondary | ICD-10-CM | POA: Diagnosis not present

## 2022-08-15 DIAGNOSIS — I5023 Acute on chronic systolic (congestive) heart failure: Secondary | ICD-10-CM | POA: Diagnosis not present

## 2022-08-15 DIAGNOSIS — I25118 Atherosclerotic heart disease of native coronary artery with other forms of angina pectoris: Secondary | ICD-10-CM | POA: Diagnosis not present

## 2022-08-15 DIAGNOSIS — N179 Acute kidney failure, unspecified: Secondary | ICD-10-CM | POA: Diagnosis not present

## 2022-08-15 MED ORDER — UMECLIDINIUM BROMIDE 62.5 MCG/ACT IN AEPB
1.0000 | INHALATION_SPRAY | Freq: Every day | RESPIRATORY_TRACT | Status: DC
  Administered 2022-08-16 – 2022-09-27 (×29): 1 via RESPIRATORY_TRACT
  Filled 2022-08-15 (×4): qty 7

## 2022-08-15 MED ORDER — METHOTREXATE SODIUM 2.5 MG PO TABS
20.0000 mg | ORAL_TABLET | ORAL | Status: DC
  Administered 2022-08-19 – 2022-08-26 (×2): 20 mg via ORAL
  Filled 2022-08-15 (×2): qty 8

## 2022-08-15 MED ORDER — FUROSEMIDE 10 MG/ML IJ SOLN
80.0000 mg | Freq: Once | INTRAMUSCULAR | Status: AC
  Administered 2022-08-15: 80 mg via INTRAVENOUS
  Filled 2022-08-15: qty 8

## 2022-08-15 MED ORDER — POTASSIUM CHLORIDE CRYS ER 20 MEQ PO TBCR
40.0000 meq | EXTENDED_RELEASE_TABLET | Freq: Once | ORAL | Status: AC
  Administered 2022-08-15: 40 meq via ORAL
  Filled 2022-08-15: qty 2

## 2022-08-15 MED ORDER — ALBUTEROL SULFATE (2.5 MG/3ML) 0.083% IN NEBU: 2.5000 mg | INHALATION_SOLUTION | RESPIRATORY_TRACT | Status: DC | PRN

## 2022-08-15 NOTE — Progress Notes (Signed)
Mobility Specialist Progress Note:   08/15/22 1520  Mobility  Activity Refused mobility    Pt refused mobility d/t not feeling well. Will f/u as able.    Charles Holmes  Mobility Specialist Please contact via [employer] office at [phone]

## 2022-08-15 NOTE — Progress Notes (Addendum)
Rec'd in report that HF team would like CVP monitoring. Charles Pitter MD paged re: pt expressing strong preference to not have CVP monitoring & clarification for order. No current order. Education given to pt.  2029- pt has agreed to be connected to CVP monitoring  Pt declines wearing o2 sensor continuously

## 2022-08-15 NOTE — Progress Notes (Signed)
Brief VAD coordinator rounding note:  Met with pt at bedside. Discussed need for dental consult / probable extractions prior to VAD implant. Pt agreeable to meeting with Dr Barbette Merino to discuss oral surgery.   Provided brief equipment overview and demonstration with HeartMate III training loop including discussion on the following:   a) mobile power unit b) system controller   c) universal [employer] features editor   d) battery clips   e) Batteries   f)  Perc lock   g) Percutaneous lead   All questions answered at this time. Will plan for current VAD pt to meet with pt at bedside tomorrow to assist Mr Donica in decision making.   [name] RN VAD Coordinator  Office: [phone]  24/7 Pager: [phone]

## 2022-08-15 NOTE — Progress Notes (Signed)
ANTICOAGULATION CONSULT NOTE  Pharmacy Consult for Heparin (Eliquis on hold) Indication: atrial fibrillation  Allergies  Allergen Reactions   Amiodarone     Severe tremors   Percocet [Oxycodone-Acetaminophen] Itching    Patient Measurements: Height: 5\' 7"  (170.2 cm) Weight: 74.4 kg (164 lb) IBW/kg (Calculated) : 66.1 Heparin Dosing Weight: total weight  Vital Signs: Temp: 97.6 F (36.4 C) (07/30 1151) Temp Source: Oral (07/30 1151) BP: 91/66 (07/30 1151) Pulse Rate: 90 (07/30 1151)  Labs: Recent Labs    08/13/22 0443 08/14/22 0208 08/15/22 0500  HGB 11.9* 11.9* 11.8*  HCT 37.4* 37.2* 36.9*  PLT 176 176 198  APTT 106* 64*  --   HEPARINUNFRC 1.02* 0.45 0.57  CREATININE 1.16 1.13 0.99    Estimated Creatinine Clearance: 75.1 mL/min (by C-G formula based on SCr of 0.99 mg/dL).   Medical History: Past Medical History:  Diagnosis Date   CAD (coronary artery disease)    CHF (congestive heart failure) (HCC)    GERD (gastroesophageal reflux disease)    Hyperlipidemia    Hypertension    Systolic heart failure (HCC) 2021   LVEF 18%, RVEF 38% on cardiac MRI 12/19/2019. possible cardiac sarcoidosis.   Wide-complex tachycardia 2021   wears LifeVest    Medications:  Scheduled:   atorvastatin  80 mg Oral Daily   chlorhexidine  15 mL Mouth/Throat BID   Chlorhexidine Gluconate Cloth  6 each Topical Daily   digoxin  0.125 mg Oral Daily   dorzolamide-timolol  1 drop Both Eyes BID   feeding supplement  237 mL Oral BID BM   folic acid  1 mg Oral Daily   furosemide  80 mg Intravenous Once   latanoprost  1 drop Both Eyes QHS   [START ON 08/19/2022] methotrexate  20 mg Oral Q Sat   mexiletine  250 mg Oral BID   mometasone-formoterol  2 puff Inhalation BID   multivitamin with minerals  1 tablet Oral Daily   pantoprazole  40 mg Oral QAC breakfast   potassium chloride  40 mEq Oral Once   sodium chloride flush  10-40 mL Intracatheter Q12H   sodium chloride flush  10-40 mL  Intracatheter Q12H   sodium chloride flush  3 mL Intravenous Q12H   sodium chloride flush  3 mL Intravenous Q12H   Infusions:   sodium chloride Stopped (08/11/22 0622)   sodium chloride Stopped (08/12/22 1638)   heparin 1,100 Units/hr (08/14/22 1534)   milrinone 0.25 mcg/kg/min (08/15/22 0903)   PRN: sodium chloride, sodium chloride, acetaminophen, ondansetron (ZOFRAN) IV, mouth rinse, sodium chloride flush, sodium chloride flush, sodium chloride flush, sodium chloride flush  Assessment: 57 yo male on apixaban for afib, recent CVA felt to be cardioembolic. Presents for cath, work up for LVAD. Last dose of apixaban 7/25 at 07:30, Hgb 11.9, Plts WNL,   Pharmacy consulted to transition apixaban to heparin.  aPTT and heparin level correlating so swithed to heparin level only yesterday. Heparin level 0.57 at goal on heparin drip 1100 uts/hr  CBC stable, no bleeding  Recent CVA will try to keep heparin level < 0.5  Goal of Therapy:  Heparin level 0.3-0.5 Monitor platelets by anticoagulation protocol: Yes   Plan:  Decrease heparin 1000 units/h Daily  heparin level, CBC Monitor s/s bleeding     Leota Sauers Pharm.D. CPP, BCPS Clinical Pharmacist [phone] 08/15/2022 2:17 PM   Please check AMION for all [facility] numbers 08/15/2022

## 2022-08-15 NOTE — Progress Notes (Signed)
PT Cancellation Note  Patient Details Name: Charles Holmes MRN: [id number] DOB: 1965-10-23   Cancelled Treatment:    Reason Eval/Treat Not Completed: Fatigue limiting ability to participate. Pt continues to feel badly and defers mobility.   [name] [facility] 08/15/2022, 3:34 PM Skip Mayer PT Acute [employer] [phone]

## 2022-08-15 NOTE — Consult Note (Addendum)
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 tach.  Notably patient had recent admission from 07/10/2022 to 07/16/2022 for acute embolic CVA.  Patient had tPA and thrombectomy at that time (please see notes in EMR for full details).  Patient states for the last 12 days he developed worsening shortness of breath and generalized fatigue associated with generalized weakness.  Patient was instructed to increase his Lasix dose to twice daily instead of daily however had no significant improvement in his symptoms.  On day of admission he was evaluated by heart failure outpatient clinic and was found to have worsening heart failure and was referred to the emergency department for further evaluation.  Subsequently patient was placed on IV Lasix for diuresis and a right heart cath showed low cardiac output and high filling pressures consistent with cardiogenic shock.  Patient was started on inotropic support with milrinone and Levophed infusions.  Patient had a right heart cath which showed nonobstructive CAD severely elevated filling pressures and low Fick CO/CI (2.7/1.4).  Cardiology service is pursuing for VAD workup.  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  Patient denies any residual weakness from recent CVA.  Objective   Blood pressure 91/66, pulse 90, temperature 97.6 F (36.4 C), temperature source Oral, resp. rate 18, height 5\' 7"  (1.702 m), weight 74.4 kg, SpO2 98%. CVP:  [11 mmHg] 11 mmHg      Intake/Output Summary (Last 24 hours) at 08/15/2022 1432 Last data filed at 08/15/2022 1215 Gross per 24 hour  Intake 1197.09 ml  Output 975 ml  Net 222.09 ml   Filed Weights   08/13/22 0500 08/14/22 0600 08/15/22 0610  Weight: 72.3 kg 72.9 kg 74.4 kg    Examination: Physical Exam Constitutional:      Appearance: Normal appearance.  HENT:     Mouth/Throat:     Mouth: Mucous membranes are moist.     Pharynx: Oropharynx is clear.  Cardiovascular:     Rate and Rhythm: Normal rate and regular rhythm.     Pulses: Normal pulses.     Heart sounds: Normal heart sounds.  Pulmonary:     Effort: Pulmonary effort is normal.     Breath sounds: Normal breath sounds.  Abdominal:     General: Abdomen is flat. Bowel sounds are normal.  Musculoskeletal:        General: Normal range of motion.  Skin:    General: Skin is warm.     Capillary Refill: Capillary refill takes less than 2 seconds.  Neurological:     General: No focal deficit present.     Mental Status: He is alert. Mental status is at baseline.  Psychiatric:        Mood and Affect:  Mood normal.        Behavior: Behavior normal.        Thought Content: Thought content normal.      Resolved Hospital Problem list     Assessment & Plan:   Stage III COPD, not in acute exacerbation PFTs with severe obstructive disease, response to bronchodilator.  FVC pre 50%, , TLC pre 88%, FEV1 pre 31% FEV1 1.04L, FEV1/FVC 48%. -incruse ellipta (LAMA), Dulera inhaler (inhaled corticosteroid and LABA), albuterol q4prn (SABA)-  -consider transitioning to nebulizer's (brovana, yupelri, budesonide)  in days leading up to surgery however not currently indicated. Treat with  these nebulizer while on ventilator perioperatively or if remains on vent postop. -encourage incentive spirometer and pulm toileting to mitigate associated risk factors -no role for systemic steroids, not currently in exacerbation   Acute on Chronic systolic HF with cardiogenic shock Echocardiogram 07/10/2022 shows EF <20%, moderate pericardial effusion with moderate MR and TR. RHC showed nonobstructive CAD, severely elevated filling pressures with low Fick CO/CI (2.7/1.4). HF due to sarcoidosis. Heart failure team primary - Management per HF service.  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 AKI Iron deficiency anemia -managed by HF and hospitalist service   Labs   CBC: Recent Labs  Lab 08/11/22 1416 08/12/22 0458 08/13/22 0443 08/14/22 0208 08/15/22 0500  WBC 10.4 9.4 7.9 7.5 8.8  NEUTROABS 8.2*  --   --   --   --   HGB 11.9* 12.0* 11.9* 11.9* 11.8*  HCT 35.9* 37.3* 37.4* 37.2* 36.9*  MCV 104.4* 106.0* 105.6* 105.1* 103.9*  PLT 181 208 176 176 198    Basic Metabolic Panel: Recent Labs  Lab 08/10/22 2041 08/11/22 0344 08/11/22 1416 08/12/22 0458 08/13/22 0443 08/14/22 0208 08/15/22 0500  NA  --  137  --  136 133* 134* 135  K  --  3.3* 3.2* 3.8 3.8 4.0 4.0  CL  --  88*  --  93* 90* 93* 98  CO2  --  33*  --  34* 32 32 30  GLUCOSE  --  106*  --  97 121* 95 133*  BUN  --  27*  --  20 20 23* 22*  CREATININE  --  1.35*  --  1.15 1.16 1.13 0.99  CALCIUM  --  8.8*  --  8.5* 8.8* 8.8* 9.1  MG 2.0 2.2  --   --   --  2.1 2.3  PHOS 4.4  --   --   --   --   --   --    GFR: Estimated Creatinine Clearance: 75.1 mL/min (by C-G formula based on SCr of 0.99 mg/dL). Recent Labs  Lab 08/12/22 0458 08/13/22 0443 08/14/22 0208 08/15/22 0500  WBC 9.4 7.9 7.5 8.8    Liver Function Tests: Recent Labs  Lab 08/10/22 1142  AST 61*  ALT 77*  ALKPHOS 77  BILITOT  3.4*  PROT 6.9  ALBUMIN 3.8   No results for input(s): "LIPASE", "AMYLASE" in the last 168 hours. No results for input(s): "AMMONIA" in the last 168 hours.  ABG    Component Value Date/Time   PHART 7.434 08/10/2022 1831   PCO2ART 39.2 08/10/2022 1831   PO2ART 79 (L) 08/10/2022 1831   HCO3 31.5 (H) 08/10/2022 1835   HCO3 34.0 (H) 08/10/2022 1835   TCO2 33 (H) 08/10/2022 1835   TCO2 36 (H) 08/10/2022 1835  O2SAT 60.8 08/15/2022 0440     Coagulation Profile: Recent Labs  Lab 08/11/22 1100  INR 1.7*    Cardiac Enzymes: No results for input(s): "CKTOTAL", "CKMB", "CKMBINDEX", "TROPONINI" in the last 168 hours.  HbA1C: Hgb A1c MFr Bld  Date/Time Value Ref Range Status  08/11/2022 03:30 PM 5.5 4.8 - 5.6 % Final    Comment:    (NOTE)         Prediabetes: 5.7 - 6.4         Diabetes: >6.4         Glycemic control for adults with diabetes: <7.0   07/11/2022 06:55 AM 5.1 4.8 - 5.6 % Final    Comment:    (NOTE) Pre diabetes:          5.7%-6.4%  Diabetes:              >6.4%  Glycemic control for   <7.0% adults with diabetes     CBG: No results for input(s): "GLUCAP" in the last 168 hours.  Critical care time: 35 minutes    Janeece Riggers, AGACNP-BC [facility] For pager details, please see AMION or use EPIC chat After 1900, please call [facility] for cross coverage needs 08/15/2022 2:32 PM

## 2022-08-15 NOTE — Care Management Important Message (Signed)
Important Message  Patient Details  Name: Charles Holmes MRN: [id number] Date of Birth: [birth year]   Medicare Important Message Given:  Yes     Renie Ora 08/15/2022, 7:38 AM

## 2022-08-15 NOTE — Progress Notes (Signed)
   08/15/22 2100  Provider Notification  Provider Name/Title Geraldo Pitter MD  Date Provider Notified 08/15/22  Time Provider Notified 2107  Method of Notification Page  Notification Reason Change in status (BP)

## 2022-08-15 NOTE — Plan of Care (Signed)
  Problem: Health Behavior/Discharge Planning: Goal: Ability to safely manage health-related needs after discharge will improve Outcome: Progressing   Problem: Health Behavior/Discharge Planning: Goal: Ability to manage health-related needs will improve Outcome: Progressing   Problem: Clinical Measurements: Goal: Ability to maintain clinical measurements within normal limits will improve Outcome: Progressing Goal: Cardiovascular complication will be avoided Outcome: Progressing   Problem: Activity: Goal: Risk for activity intolerance will decrease Outcome: Progressing

## 2022-08-15 NOTE — Progress Notes (Signed)
Advanced Heart Failure Rounding Note  PCP-Cardiologist: Norman Herrlich, MD   Subjective:    07/25: Admit with cardiogenic shock. Started milrinone and NE.  CO-OX 61% on 0.25 NE.   CVP not hooked up  More short of breath today. Feels he is holding on to more fluid.   R/LHC 07/25 Findings: Ao = 98/73 (79) LV = 101/39 RA = 19 RV = 76/21 PA = 80/42 (57) PCW = 47 (v = 55) Fick cardiac output/index = 2.7/1.4 PVR = 3.0 WU Ao sat = 96% PA sat = 41%, 39% PAPi = 2.0   Assessment: 1. Mild non-obstructive CAD 2. Severe NICM with severely elevated filling pressures and low output state consistent with cardiogenic shock  Objective:   Weight Range: 74.4 kg Body mass index is 25.69 kg/m.   Vital Signs:   Temp:  [97.6 F (36.4 C)-98.4 F (36.9 C)] 98.4 F (36.9 C) (07/30 0751) Pulse Rate:  [58-106] 84 (07/30 0819) Resp:  [18-20] 18 (07/30 0751) BP: (90-109)/(59-76) 94/64 (07/30 0751) SpO2:  [91 %-98 %] 96 % (07/30 0815) Weight:  [74.4 kg] 74.4 kg (07/30 0610) Last BM Date : 08/14/22  Weight change: Filed Weights   08/13/22 0500 08/14/22 0600 08/15/22 0610  Weight: 72.3 kg 72.9 kg 74.4 kg    Intake/Output:   Intake/Output Summary (Last 24 hours) at 08/15/2022 1053 Last data filed at 08/15/2022 0817 Gross per 24 hour  Intake 1006.88 ml  Output 1050 ml  Net -43.12 ml      Physical Exam   General:  Sitting up in bed. HEENT: normal Neck: supple. Jvp 10 cm. Carotids 2+ bilat; no bruits.  Cor: PMI nondisplaced. Regular rate & rhythm. No rubs, gallops or murmurs. Lungs: clear Abdomen: soft, nontender, nondistended.  Extremities: no cyanosis, clubbing, rash, edema, RUE PICC Neuro: alert & orientedx3. Affect pleasant   Telemetry   V paced 80s  EKG    Holmes/A   Labs    CBC Recent Labs    08/14/22 0208 08/15/22 0500  WBC 7.5 8.8  HGB 11.9* 11.8*  HCT 37.2* 36.9*  MCV 105.1* 103.9*  PLT 176 198   Basic Metabolic Panel Recent Labs     08/14/22 0208 08/15/22 0500  NA 134* 135  K 4.0 4.0  CL 93* 98  CO2 32 30  GLUCOSE 95 133*  BUN 23* 22*  CREATININE 1.13 0.99  CALCIUM 8.8* 9.1  MG 2.1 2.3   Liver Function Tests No results for input(s): "AST", "ALT", "ALKPHOS", "BILITOT", "PROT", "ALBUMIN" in the last 72 hours.  No results for input(s): "LIPASE", "AMYLASE" in the last 72 hours. Cardiac Enzymes No results for input(s): "CKTOTAL", "CKMB", "CKMBINDEX", "TROPONINI" in the last 72 hours.  BNP: BNP (last 3 results) Recent Labs    07/11/22 0209 07/26/22 1211 08/10/22 1142  BNP 1,588.2* 1,723.6* 1,905.0*    D-Dimer No results for input(s): "DDIMER" in the last 72 hours. Hemoglobin A1C No results for input(s): "HGBA1C" in the last 72 hours.  Fasting Lipid Panel No results for input(s): "CHOL", "HDL", "LDLCALC", "TRIG", "CHOLHDL", "LDLDIRECT" in the last 72 hours.  Thyroid Function Tests No results for input(s): "TSH", "T4TOTAL", "T3FREE", "THYROIDAB" in the last 72 hours.  Invalid input(s): "FREET3"   Other results:   Imaging    No results found.   Medications:     Scheduled Medications:  atorvastatin  80 mg Oral Daily   chlorhexidine  15 mL Mouth/Throat BID   Chlorhexidine Gluconate Cloth  6 each Topical  Daily   digoxin  0.125 mg Oral Daily   dorzolamide-timolol  1 drop Both Eyes BID   feeding supplement  237 mL Oral BID BM   folic acid  1 mg Oral Daily   furosemide  40 mg Oral Daily   latanoprost  1 drop Both Eyes QHS   mexiletine  250 mg Oral BID   mometasone-formoterol  2 puff Inhalation BID   multivitamin with minerals  1 tablet Oral Daily   pantoprazole  40 mg Oral QAC breakfast   sodium chloride flush  10-40 mL Intracatheter Q12H   sodium chloride flush  10-40 mL Intracatheter Q12H   sodium chloride flush  3 mL Intravenous Q12H   sodium chloride flush  3 mL Intravenous Q12H    Infusions:  sodium chloride Stopped (08/11/22 0622)   sodium chloride Stopped (08/12/22 1638)    heparin 1,100 Units/hr (08/14/22 1534)   milrinone 0.25 mcg/kg/min (08/15/22 0903)    PRN Medications: sodium chloride, sodium chloride, acetaminophen, ondansetron (ZOFRAN) IV, mouth rinse, sodium chloride flush, sodium chloride flush, sodium chloride flush, sodium chloride flush    Patient Profile   Charles Holmes is a 57 y.o. 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   Assessment/Plan  1.  Acute on chronic Systolic HF-->Cardiogenic Shock  - Diagnosed 11/2019. Presented with VT. LHC 70% LAD  -- cMRI 12/21 concerning for sarcoid and EF 18%.  - PET 2/22 at [facility] EF 25% + active sarcoid - Echo 08/26/20 EF < 20% severely dilated LV RV mildly decreased.  - CPX 8/22. Unable to complete PFTs due to hypoventilation and poor effort. pVO2 16.1 (49% predicted). Slope 19 RER: 1.17 PETCO2 59 - [employer] CRT-D upgrade in 06/08/21 - Echo 11/07/21: EF 20-25% RV mild to moderately reduced.  - Echo 07/10/22: EF <20%, RV okay, mod pericardial effusion, mod Charles/TR - Admitted 07/25 with cardiogenic shock. - RHC: Nonobstructive CAD, severely elevated filling pressures and low Fick CO/CI (2.7/1.4) - CO-OX 61% on milrinone 0.25. Worry that he is inotrope dependent. More symptoms of volume overload today. CVP not set up but JVP elevated. Give 80 mg lasix IV. - Continue digoxin 0.125 mcg daily.  - no BP room for GDMT - VAD workup underway. Discussed in [facility] 07/29. Likely not stable enough to discharge home before VAD. Will need dental extraction prior to surgery.     2.  Hx of stroke -Admitted with acute L sided weakness 6/24, CTA revealed an acute right M1 occlusion. S/p TPA and mechanical clot extraction. No residual deficits  -Likely cardioembolic in setting of severe LV dysfunction. 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 EKG today A sensed- v paced w/ underlying rhythm sinus.  - Off eliquis  - Continue heparin drip until all procedures completed.   3.  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 Continue mexilitene  - now s/p ICD. No VT on interrogation.    4. CAD - LHC 12/07/19 70-% LAD, no intervention - LHC mild non obstructive CAD.  - Continue statin and aspirin.    5. Cardiac sarcoid - PET 2/22 at [facility] EF 25% + active sarcoid - Has completed prednisone.  - Takes MTX 20 weekly, Saturday. Continue folic acid - Previously discussed EM biopsy but patient is reluctant  - Needs repeat PET, wasn't able to schedule 10/23 as insurance would not cover. Now has medicaid and medicare. Plan to reschedule once  discharged   6. 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 AKI - Creatinine baseline around 1.1, Scr up to 1.6. Now at baseline.  - suspect cardiorenal   8. Iron deficiency anemia - recent T sat 15%, scheduled for OP feraheme. Will complete inpatient after workup complete.    9. Pulmonary  - PFTs with severe obstructive defect, response to bronchodilator -FEV1 1.04L, FEV1/FVC 48% -Consult pulmonary pre VAD to assist with optimization  Length of Stay: 5  Charles Mcvay N, PA-C  08/15/2022, 10:53 AM  Advanced Heart Failure Team Pager [phone] (M-F; 7a - 5p)  Please contact [facility] for night-coverage after hours (5p -7a ) and weekends on [email or web address]

## 2022-08-15 NOTE — Progress Notes (Signed)
Initial Nutrition Assessment   INTERVENTION:   Ensure Enlive po BID, each supplement provides 350 kcal and 20 grams of protein.  MVI with Minerals daily  Education on adequate nutrition   NUTRITION DIAGNOSIS:   Inadequate oral intake related to catabolic illness, poor appetite as evidenced by per patient/family report.  GOAL:   Patient will meet greater than or equal to 90% of their needs  MONITOR:   PO intake, Supplement acceptance, Labs, Weight trends  REASON FOR ASSESSMENT:   Consult LVAD Eval  ASSESSMENT:   57 yo male admitted from cardiologist office with worsening fatigue, shortness of breath and poor appetite with acute on chronic HF with significant reduced LV dysfunction (EF 20%) and preserved RV function. Pt being evaluated for possible LVAD placement. PMH includes cardiac and pulmonary sarcoidosis, CAD, GERD, HLD, HTN. Noted recent hospitalization from 6/24-6/30 this year with acute emoblic CVA.  7/25 Admitted 7/26 LVAD work-up initiated  Visited patient at bedside whose family was present. He reports much improved appetite since being admitted and reports that his appetite was poor for at least a couple weeks due to feeling poorly and having fluid on his abdomen. His daughter at bedside reports that he was also dry heaving PTA. Patient reports that he would take a bite of his meal and not be able to eat anymore.   Patient denies N/V/D/C and chewing/swallowing difficulties at this time   He reports he only likes strawberry Ensure- RD to edit his order.   He reports low activity since his 45s and reports he hasn't had good muscle mass since then besides in his legs which RD did observe strong calf  muscles on NFPE.   Labs: reviewed  Meds: reviewed   Wt: admit- 164#, current-166#  PO: 88% avg po x 4 documented meals  I/O's: -8.2 L   NUTRITION - FOCUSED PHYSICAL EXAM:  Flowsheet Row Most Recent Value  Orbital Region No depletion  Upper Arm Region Moderate  depletion  Thoracic and Lumbar Region No depletion  Buccal Region No depletion  Temple Region No depletion  Clavicle Bone Region No depletion  Clavicle and Acromion Bone Region No depletion  Scapular Bone Region Unable to assess  Dorsal Hand Moderate depletion  Patellar Region No depletion  Anterior Thigh Region Moderate depletion  Posterior Calf Region No depletion  Edema (RD Assessment) None  Hair Reviewed  Eyes Reviewed  Mouth Reviewed  Skin Reviewed  Nails Reviewed        Diet Order:   Diet Order             Diet Heart Room service appropriate? Yes; Fluid consistency: Thin  Diet effective now                   EDUCATION NEEDS:   Not appropriate for education at this time  Skin:  Skin Assessment: Reviewed RN Assessment  Last BM:  7/29 type 1  Height:   Ht Readings from Last 1 Encounters:  08/12/22 5\' 7"  (1.702 m)    Weight:   Wt Readings from Last 1 Encounters:  08/15/22 74.4 kg    BMI:  Body mass index is 25.69 kg/m.  Estimated Nutritional Needs:   Kcal:  2000-2200 kcals  Protein:  100-115 g  Fluid:  1.8 L    Leodis Rains, RDN, LDN  Clinical Nutrition

## 2022-08-15 NOTE — Progress Notes (Signed)
Progress Note   Patient: Charles Holmes ZOX:[id number] DOB: Nov 27, 1965 DOA: 08/10/2022     5 DOS: the patient was seen and examined on 08/15/2022   Brief hospital course: Charles Holmes was admitted to the hospital with the working diagnosis of heart failure decompensation.   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 For the last 12 days he developed worsening dyspnea, generalized fatigue and weakness. He was instructed to increase his furosemide dose to bid, with no significant improvement in his symptoms. On the day of admission he was evaluated at the heart failure outpatient clinic, he was found with worsening heart failure and was referred to the ED for further evaluation.  On his initial physical examination his blood pressure was 116/84, HR 87, RR 18 and 02 saturation 94%, positive JVD, lungs with bilateral rales, and increased work of breathing, heart with S1 and S2 present and regular, with no gallops or murmurs, abdomen with no distention and positive lower extremity edema ++.   Chest radiograph with cardiomegaly, bilateral hilar vascular congestion with cephalization of the vasculature, bilateral interstitial infiltrates, symmetric and central. No pleural effusions. Pace maker defibrillator in place with one right atrial lead, and biventricular leads.   EKG 79 bpm, left axis deviation, left anterior fascicular block, right bundle branch block, ventricular paced rhythm with no significant ST segment or T wave changes.   Patient was placed on IV furosemide for diuresis. 07/25 right heart catheterization, with low cardiac output and high filling pressures, consistent with cardiogenic shock.  Started on inotropic support, milrinone and norepinephrine  infusions.  07/26 consulted CT surgery for possible LVAT.   07/27 discontinued furosemide drip.  Change level of care to progressive.   Assessment and Plan: * Acute on chronic systolic CHF (congestive heart failure) (HCC) Echocardiogram with reduced LV systolic function <20%, severe dilatation of LV cavity, no LVH, LV inferior wall septum akinetic, hypokinetic entire anterior wall, entire lateral wall, anterior septum, entire inferior wall, and apex. RV systolic function preserved, RVSP 59,0 mmHg.  LA with mild dilatation, moderate pericardial effusion, localized near the right atrium and anterior to the right ventricle with no signs of tamponade, moderate mitral valve regurgitation, moderate TR.   07/25 cardiac catheterization  PA 80/42 mean 57 PCWP 47 Cardiac output 2.7 and index 1,4 (Fick). PVR 3,0 WU  Recovering cardiogenic shock.  Pre and post capillary pulmonary hypertension.   Urine output 1,250  ml Systolic blood pressure 91 to 94  mmHg.   Off Norepinephrine  On milrinone 0,25 mcg/kg/min  Continue digoxin.  Furosemide IV 80 mg x1  this am.   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  Pulmonary work up for possible LVAT.   CAD (coronary artery disease) Coronary angiography with proximal Cx 20 % stenosis, mid LAD 50% stenosis. Continue with atorvastatin.     HTN (hypertension) Continue blood pressure support with milrinone.   Ventricular tachycardia (HCC) Patient on mexiletine.  Continue telemetry monitoring. Keep K at 4 and Mg at 2.   Cardiac sarcoidosis Continue with methotrexate.   AKI (acute kidney injury) (HCC) Hypokalemia.   Renal function continue to be stable, AKI has resolved. Renal function with serum cr at 0,9 with K at 4,0 and serum bicarbonate at 30.  Na 135   Plan to continue hemodynamic support and  diuresis. Continue renal function monitoring, bmp in am.    History of CVA (cerebrovascular accident) Continue with statin therapy.   Patient on heparin IV for anticoagulation in preparation for possible invasive cardiac intervention.   Paroxysmal atrial fibrillation (HCC) Continue telemetry with V pacing.  Anticoagulation with heparin IV for now.   Iron deficiency anemia Continue iron supplementation.         Subjective: patient is feeling better this pm, apparently in the morning had worsening dyspnea, Currently with no chest pain   Physical Exam: Vitals:   08/15/22 0819 08/15/22 1151 08/15/22 1424 08/15/22 1529  BP:  91/66 (!) 94/57   Pulse: 84 90    Resp:  18  20  Temp:  97.6 F (36.4 C)  97.8 F (36.6 C)  TempSrc:  Oral  Oral  SpO2:  98%    Weight:      Height:       Neurology awake and alert ENT with mild pallor Cardiovascular with S1 and S2 present and regular with no gallops or rubs Respiratory with no rales or wheezing Abdomen with no distention  No lower extremity edema  Data Reviewed:    Family Communication: no family at the bedside   Disposition: Status is: Inpatient Remains inpatient appropriate because: LVAT work up   Planned Discharge Destination: Home     Author: Coralie Keens, MD 08/15/2022 5:51 PM  For on call review [email or web address].

## 2022-08-15 NOTE — Progress Notes (Signed)
PT Cancellation Note  Patient Details Name: DAQUANN DAZA MRN: [id number] DOB: Jun 21, 1965   Cancelled Treatment:    Reason Eval/Treat Not Completed: Other (comment). Pt reports he does not feel good this morning and feels very fatigued. Asked to be checked on later for mobility/strengthening.   [name] [facility] 08/15/2022, 9:43 AM Skip Mayer PT Acute [employer] [phone]

## 2022-08-16 DIAGNOSIS — I509 Heart failure, unspecified: Secondary | ICD-10-CM | POA: Diagnosis not present

## 2022-08-16 DIAGNOSIS — J449 Chronic obstructive pulmonary disease, unspecified: Secondary | ICD-10-CM | POA: Diagnosis not present

## 2022-08-16 DIAGNOSIS — I5023 Acute on chronic systolic (congestive) heart failure: Secondary | ICD-10-CM | POA: Diagnosis not present

## 2022-08-16 LAB — HEPARIN LEVEL (UNFRACTIONATED): Heparin Unfractionated: 0.3 IU/mL (ref 0.30–0.70)

## 2022-08-16 NOTE — Progress Notes (Signed)
Mobility Specialist Progress Note:   08/16/22 1340  Mobility  Activity Ambulated independently in hallway  Level of Assistance Standby assist, set-up cues, supervision of patient - no hands on  Assistive Device  (IV pole)  Distance Ambulated (ft) 440 ft  RUE Weight Bearing NWB  Activity Response Tolerated well  Mobility Referral Yes  $Mobility charge 1 Mobility  Mobility Specialist Start Time (ACUTE ONLY) 1330  Mobility Specialist Stop Time (ACUTE ONLY) 1337  Mobility Specialist Time Calculation (min) (ACUTE ONLY) 7 min   Pre Mobility: 93 HR  During Mobility: 76 HR  Post Mobility: 67 HR   Pt received in chair, agreeable to mobility. Denied any feelings of discomfort or SOB, asymptomatic throughout. Pt returned to chair with call bell near and all needs met.  Leory Plowman  Mobility Specialist Please contact via [employer] office at [phone]

## 2022-08-16 NOTE — Progress Notes (Signed)
PROGRESS NOTE    Charles Holmes  ZOX:[id number] DOB: Sep 22, 1965 DOA: 08/10/2022 PCP: [name], PA-C  Charles Holmes is a 57 y.o. 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 tach, cardiac sarcoidosis, paroxysmal A-fib presented to the ED with worsening dyspnea, fatigue X 12 days, in the ER he was volume overloaded, tachypneic with 3+ edema, chest x-ray noted cardiomegaly pulmonary vascular congestion bilateral infiltrates -Admitted, treated with diuretics -RHC 7/25 noted low cardiac output, high filling pressures, concern for cardiogenic shock, started on milrinone and nor epi -7/26 CT surgery consulted for possible LVAD -7/27 Lasix drip discontinued   Subjective: -feels ok, no events overnight  Assessment and Plan:  Acute on chronic systolic CHF, cardiogenic shock [name] Moderate pericardial effusion -Echo Ef <20%, severe dilatation of LV cavity, akinetic, hypokinetic entire anterior wall, entire lateral wall, anterior septum, entire inferior wall, and apex. RV systolic function preserved, moderate pericardial effusion, moderate mitral valve regurgitation, moderate TR.  -Cardiac MRI in 12/21 was concerning for sarcoidosis -RHC this admit with nonobstructive CAD, severely elevated filling pressures and low Fick -Currently on milrinone, no Lasix today -Heart failure team and T CTS following, VAD workup underway -Needs dental extraction prior to surgery, [name] consulted  CAD (coronary artery disease) Coronary angiography with proximal Cx 20 % stenosis, mid LAD 50% stenosis. Continue ASA/statin  HTN (hypertension) Continue milrinone.   Ventricular tachycardia (HCC) Patient on mexiletine.  Keep K at 4 and Mg at 2.   Cardiac sarcoidosis Continue with methotrexate.   AKI (acute kidney injury) (HCC) Hypokalemia.  -Resolved  History of CVA (cerebrovascular accident) -Right MCA stroke  6/24 with acute right M1 occlusion, treated with tPA and mechanical clot extraction -Currently on IV heparin, off Eliquis, continue statin  Paroxysmal atrial fibrillation (HCC) Continue telemetry with V pacing.  Anticoagulation with heparin IV for now.   Iron deficiency anemia Continue iron supplementation.  -plan for IV fe after workup complete  Severe COPD -stable, continue Dulera, as needed albuterol  DVT prophylaxis: Hep gtt Code Status: Full Code Family Communication: none present Disposition Plan:   Consultants:    Procedures:   Antimicrobials:    Objective: Vitals:   08/16/22 0705 08/16/22 0753 08/16/22 0817 08/16/22 0835  BP:  (!) 94/57    Pulse:  93  99  Resp:  17    Temp:  98.5 F (36.9 C)    TempSrc:  Oral    SpO2:  96% 95%   Weight: 74 kg     Height:        Intake/Output Summary (Last 24 hours) at 08/16/2022 1358 Last data filed at 08/16/2022 1239 Gross per 24 hour  Intake 340 ml  Output 2200 ml  Net -1860 ml   Filed Weights   08/14/22 0600 08/15/22 0610 08/16/22 0705  Weight: 72.9 kg 74.4 kg 74 kg    Examination:  General exam: Appears calm and comfortable  Respiratory system: poor air movt, clear Cardiovascular system: S1 & S2 heard, RRR.  Abd: nondistended, soft and nontender.Normal bowel sounds heard. Central nervous system: Alert and oriented. No focal neurological deficits. Extremities: no edema Skin: No rashes Psychiatry:  Mood & affect appropriate.     Data Reviewed:   CBC: Recent Labs  Lab 08/11/22 1416 08/12/22 0458 08/13/22 0443 08/14/22 0208 08/15/22 0500 08/16/22 0500  WBC 10.4 9.4 7.9 7.5 8.8 6.7  NEUTROABS 8.2*  --   --   --   --   --  HGB 11.9* 12.0* 11.9* 11.9* 11.8* 10.7*  HCT 35.9* 37.3* 37.4* 37.2* 36.9* 33.4*  MCV 104.4* 106.0* 105.6* 105.1* 103.9* 106.0*  PLT 181 208 176 176 198 184   Basic Metabolic Panel: Recent Labs  Lab 08/10/22 2041 08/11/22 0344 08/11/22 1416 08/12/22 0458 08/13/22 0443  08/14/22 0208 08/15/22 0500 08/16/22 0500  NA  --  137  --  136 133* 134* 135 133*  K  --  3.3*   < > 3.8 3.8 4.0 4.0 4.3  CL  --  88*  --  93* 90* 93* 98 96*  CO2  --  33*  --  34* 32 32 30 30  GLUCOSE  --  106*  --  97 121* 95 133* 83  BUN  --  27*  --  20 20 23* 22* 22*  CREATININE  --  1.35*  --  1.15 1.16 1.13 0.99 0.88  CALCIUM  --  8.8*  --  8.5* 8.8* 8.8* 9.1 8.6*  MG 2.0 2.2  --   --   --  2.1 2.3 2.1  PHOS 4.4  --   --   --   --   --   --   --    < > = values in this interval not displayed.   GFR: Estimated Creatinine Clearance: 84.5 mL/min (by C-G formula based on SCr of 0.88 mg/dL). Liver Function Tests: Recent Labs  Lab 08/10/22 1142  AST 61*  ALT 77*  ALKPHOS 77  BILITOT 3.4*  PROT 6.9  ALBUMIN 3.8   No results for input(s): "LIPASE", "AMYLASE" in the last 168 hours. No results for input(s): "AMMONIA" in the last 168 hours. Coagulation Profile: Recent Labs  Lab 08/11/22 1100  INR 1.7*   Cardiac Enzymes: No results for input(s): "CKTOTAL", "CKMB", "CKMBINDEX", "TROPONINI" in the last 168 hours. BNP (last 3 results) No results for input(s): "PROBNP" in the last 8760 hours. HbA1C: No results for input(s): "HGBA1C" in the last 72 hours. CBG: No results for input(s): "GLUCAP" in the last 168 hours. Lipid Profile: No results for input(s): "CHOL", "HDL", "LDLCALC", "TRIG", "CHOLHDL", "LDLDIRECT" in the last 72 hours. Thyroid Function Tests: No results for input(s): "TSH", "T4TOTAL", "FREET4", "T3FREE", "THYROIDAB" in the last 72 hours. Anemia Panel: No results for input(s): "VITAMINB12", "FOLATE", "FERRITIN", "TIBC", "IRON", "RETICCTPCT" in the last 72 hours. Urine analysis:    Component Value Date/Time   COLORURINE STRAW (A) 08/11/2022 1328   APPEARANCEUR CLEAR 08/11/2022 1328   LABSPEC 1.006 08/11/2022 1328   PHURINE 7.0 08/11/2022 1328   GLUCOSEU 150 (A) 08/11/2022 1328   HGBUR NEGATIVE 08/11/2022 1328   BILIRUBINUR NEGATIVE 08/11/2022 1328    KETONESUR NEGATIVE 08/11/2022 1328   PROTEINUR NEGATIVE 08/11/2022 1328   NITRITE NEGATIVE 08/11/2022 1328   LEUKOCYTESUR NEGATIVE 08/11/2022 1328   Sepsis Labs: @LABRCNTIP (procalcitonin:4,lacticidven:4)  ) Recent Results (from the past 240 hour(s))  MRSA Next Gen by PCR, Nasal     Status: None   Collection Time: 08/12/22  4:58 AM   Specimen: Nasal Mucosa; Nasal Swab  Result Value Ref Range Status   MRSA by PCR Next Gen NOT DETECTED NOT DETECTED Final    Comment: (NOTE) The GeneXpert MRSA Assay (FDA approved for NASAL specimens only), is one component of a comprehensive MRSA colonization surveillance program. It is not intended to diagnose MRSA infection nor to guide or monitor treatment for MRSA infections. Test performance is not FDA approved in patients less than 74 years old. Performed at [facility]  Lab, [address], [place], [place] [address]      Radiology Studies: No results found.   Scheduled Meds:  atorvastatin  80 mg Oral Daily   chlorhexidine  15 mL Mouth/Throat BID   Chlorhexidine Gluconate Cloth  6 each Topical Daily   digoxin  0.125 mg Oral Daily   dorzolamide-timolol  1 drop Both Eyes BID   feeding supplement  237 mL Oral BID BM   folic acid  1 mg Oral Daily   latanoprost  1 drop Both Eyes QHS   [START ON 08/19/2022] methotrexate  20 mg Oral Q Sat   mexiletine  250 mg Oral BID   mometasone-formoterol  2 puff Inhalation BID   multivitamin with minerals  1 tablet Oral Daily   pantoprazole  40 mg Oral QAC breakfast   sodium chloride flush  10-40 mL Intracatheter Q12H   sodium chloride flush  10-40 mL Intracatheter Q12H   sodium chloride flush  3 mL Intravenous Q12H   sodium chloride flush  3 mL Intravenous Q12H   umeclidinium bromide  1 puff Inhalation Daily   Continuous Infusions:  sodium chloride Stopped (08/11/22 0622)   sodium chloride Stopped (08/12/22 1638)   heparin 1,000 Units/hr (08/15/22 1434)   milrinone 0.375 mcg/kg/min (08/16/22  1102)     LOS: 6 days    Time spent:    [name], MD [facility]   08/16/2022, 1:58 PM

## 2022-08-16 NOTE — Progress Notes (Signed)
6 Days Post-Op Procedure(s) (LRB): RIGHT/LEFT HEART CATH AND CORONARY ANGIOGRAPHY (N/A) Subjective: Results of VAD team MRB reviewed with patient. His COPD will be optimized preop to reduce risk of prolonged intubation- Pulmonayr consult noted. His breathing is better today after diuresis.   Objective: Vital signs in last 24 hours: Temp:  [97.6 F (36.4 C)-98.5 F (36.9 C)] 98.5 F (36.9 C) (07/31 0753) Pulse Rate:  [83-99] 99 (07/31 0835) Cardiac Rhythm: Ventricular paced;Bundle branch block (07/30 1908) Resp:  [16-20] 17 (07/31 0753) BP: (89-113)/(57-92) 94/57 (07/31 0753) SpO2:  [92 %-98 %] 95 % (07/31 0817) Weight:  [74 kg] 74 kg (07/31 0705)  Hemodynamic parameters for last 24 hours: CVP:  [3 mmHg-33 mmHg] 11 mmHg  Intake/Output from previous day: 07/30 0701 - 07/31 0700 In: 720 [P.O.:720] Out: 2275 [Urine:2275] Intake/Output this shift: Total I/O In: 100 [P.O.:100] Out: 100 [Urine:100]       Exam    General- alert and comfortable    Neck- no JVD, no cervical adenopathy palpable, no carotid bruit   Lungs- clear without rales, wheezes   Cor- regular rate and rhythm, no murmur , gallop   Abdomen- soft, non-tender   Extremities - warm, non-tender, minimal edema   Neuro- oriented, appropriate, no focal weakness   Lab Results: Recent Labs    08/15/22 0500 08/16/22 0500  WBC 8.8 6.7  HGB 11.8* 10.7*  HCT 36.9* 33.4*  PLT 198 184   BMET:  Recent Labs    08/15/22 0500 08/16/22 0500  NA 135 133*  K 4.0 4.3  CL 98 96*  CO2 30 30  GLUCOSE 133* 83  BUN 22* 22*  CREATININE 0.99 0.88  CALCIUM 9.1 8.6*    PT/INR: No results for input(s): "LABPROT", "INR" in the last 72 hours. ABG    Component Value Date/Time   PHART 7.434 08/10/2022 1831   HCO3 31.5 (H) 08/10/2022 1835   HCO3 34.0 (H) 08/10/2022 1835   TCO2 33 (H) 08/10/2022 1835   TCO2 36 (H) 08/10/2022 1835   O2SAT 56.4 08/16/2022 0442   CBG (last 3)  No results for input(s): "GLUCAP" in the  last 72 hours.  Assessment/Plan: S/P Procedure(s) (LRB): RIGHT/LEFT HEART CATH AND CORONARY ANGIOGRAPHY (N/A) Proceed with prep for HM3- dental eval and therapy, cont iv milrinone, allow time for brain recovery after R MCA occlusion and mechanical thrombectomy 4 weeks ago.   LOS: 6 days    Lovett Sox 08/16/2022

## 2022-08-16 NOTE — Progress Notes (Signed)
Physical Therapy Treatment Patient Details Name: Charles Holmes MRN: [id number] DOB: 1965-06-13 Today's Date: 08/16/2022   History of Present Illness Pt is 57 year old presented [facility] on  08/10/22 for acute on chronic systolic heart failure. Pt being evaluated for LVAD. PMH - CAD, CHF, HLD, HTN, systolic HF, ICD implant, rt CVA    PT Comments  Pt received in supine and agreeable to session. Pt instructed in supine therex with pt able to demo back. Pt encouraged to perform exercises independently for increased strength. Pt able to stand from low EOB x7 without UE support and no LOB. Pt able to perform gait distance with supervision for safety. Pt able to recall sternal precautions with cues and follow during session. Pt continues to benefit from PT services to progress toward functional mobility goals.     If plan is discharge home, recommend the following:     Can travel by private vehicle        Equipment Recommendations  None recommended by PT    Recommendations for Other Services       Precautions / Restrictions Precautions Precautions: None Restrictions RUE Weight Bearing: Non weight bearing     Mobility  Bed Mobility Overal bed mobility: Modified Independent                  Transfers Overall transfer level: Modified independent Equipment used: None               General transfer comment: STS from EOB x7 with hands on knees    Ambulation/Gait Ambulation/Gait assistance: Supervision Gait Distance (Feet): 400 Feet Assistive device: None Gait Pattern/deviations: Decreased stride length, Drifts right/left       General Gait Details: Slight unsteadiness, but no LOB without AD       Balance Overall balance assessment: No apparent balance deficits (not formally assessed)                                          Cognition Arousal/Alertness: Awake/alert Behavior During Therapy: WFL for tasks assessed/performed Overall Cognitive  Status: Within Functional Limits for tasks assessed                                          Exercises General Exercises - Lower Extremity Ankle Circles/Pumps: AROM, Supine, Both, 5 reps Quad Sets: AROM, Supine, Both, 5 reps Heel Slides: AROM, Supine, Both, 5 reps Hip ABduction/ADduction: AROM, Supine, Both, 5 reps Straight Leg Raises: AROM, Supine, Both, 5 reps    General Comments General comments (skin integrity, edema, etc.): VSS on RA      Pertinent Vitals/Pain Pain Assessment Pain Assessment: No/denies pain     PT Goals (current goals can now be found in the care plan section) Acute Rehab PT Goals Patient Stated Goal: Consider getting LVAD PT Goal Formulation: With patient Time For Goal Achievement: 08/27/22 Potential to Achieve Goals: Good Progress towards PT goals: Progressing toward goals    Frequency    Min 1X/week      PT Plan Current plan remains appropriate    AM-PAC PT "6 Clicks" Mobility   Outcome Measure  Help needed turning from your back to your side while in a flat bed without using bedrails?: None Help needed moving from lying on your back to  sitting on the side of a flat bed without using bedrails?: None Help needed moving to and from a bed to a chair (including a wheelchair)?: None Help needed standing up from a chair using your arms (e.g., wheelchair or bedside chair)?: None Help needed to walk in hospital room?: None Help needed climbing 3-5 steps with a railing? : A Little 6 Click Score: 23    End of Session   Activity Tolerance: Patient tolerated treatment well Patient left: with call bell/phone within reach;in chair Nurse Communication: Mobility status PT Visit Diagnosis: Muscle weakness (generalized) (M62.81)     Time: 1610-9604 PT Time Calculation (min) (ACUTE ONLY): 16 min  Charges:    $Gait Training: 8-22 mins PT General Charges $$ ACUTE PT VISIT: 1 Visit                     Johny Shock, PTA Acute  Rehabilitation Services Secure Chat Preferred  Office:([phone]    Johny Shock 08/16/2022, 3:16 PM

## 2022-08-16 NOTE — Progress Notes (Signed)
NAME:  Charles Holmes, MRN:  [id number], DOB:  Dec 06, 1965, LOS: 6 ADMISSION DATE:  08/10/2022, CONSULTATION DATE:  08/16/22  REFERRING MD:  TRH, CHIEF COMPLAINT:  SOB   History of Present Illness:  Charles Holmes is a 57 year old male with past medical history notable for acute embolic CVA, requiring tPA and thrombectomy at the right MCA, multivessel CAD, HFrEF with EF<20% due to sarcoidosis, paroxysmal V. tach.  Notably patient had recent admission from 07/10/2022 to 07/16/2022 for acute embolic CVA.  Patient had tPA and thrombectomy at that time (please see notes in EMR for full details).  Patient states for the last 12 days he developed worsening shortness of breath and generalized fatigue associated with generalized weakness.  Patient was instructed to increase his Lasix dose to twice daily instead of daily however had no significant improvement in his symptoms.  On day of admission he was evaluated by heart failure outpatient clinic and was found to have worsening heart failure and was referred to the emergency department for further evaluation.  Subsequently patient was placed on IV Lasix for diuresis and a right heart cath showed low cardiac output and high filling pressures consistent with cardiogenic shock.  Patient was started on inotropic support with milrinone and Levophed infusions.  Patient had a right heart cath which showed nonobstructive CAD severely elevated filling pressures and low Fick CO/CI (2.7/1.4).  Cardiology service is pursuing for VAD workup.  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  Patient denies any residual weakness from recent CVA.  Objective   Blood pressure (!) 94/57, pulse 99, temperature 98.5 F (36.9 C), temperature source Oral, resp. rate 17, height 5\' 7"  (1.702 m), weight 74 kg, SpO2 95%. CVP:  [3 mmHg-33 mmHg] 11 mmHg      Intake/Output Summary (Last 24 hours) at 08/16/2022 1438 Last data filed at 08/16/2022 1239 Gross per 24 hour  Intake 340 ml  Output 2200 ml  Net -1860 ml   Filed Weights   08/14/22 0600 08/15/22 0610 08/16/22 0705  Weight: 72.9 kg 74.4 kg 74 kg    Examination: Blood pressure (!) 94/57, pulse 99, temperature 98.5 F (36.9 C), temperature source Oral, resp. rate 17, height 5\' 7"  (1.702 m), weight 74 kg, SpO2 95%. Gen:      No acute distress HEENT:  EOMI, sclera anicteric Neck:     No masses; no thyromegaly Lungs:    Diminished breath sounds, no wheezing CV:         Regular rate and rhythm; no murmurs Abd:      + bowel sounds; soft, non-tender; no palpable masses, no distension Ext:    No edema; adequate peripheral perfusion Skin:      Warm and dry; no rash Neuro: alert and oriented x 3 Psych: normal mood and affect   Resolved Hospital Problem list     Assessment & Plan:   Severe COPD, not in acute exacerbation PFTs with severe obstructive disease, response to bronchodilator.  FVC pre 50%, , TLC pre 88%, FEV1 pre 31% FEV1 1.04L, FEV1/FVC 48%. Continue Incruse, Dulera, albuterol as  needed Consider transitioning to nebulizer's (brovana, yupelri, budesonide)  in days leading up to surgery however not currently indicated. Treat with these nebulizer while on ventilator perioperatively or if remains on vent postop. -encourage incentive spirometer and pulm toileting to mitigate associated risk factors -no role for systemic steroids, not currently in exacerbation   Acute on Chronic systolic HF with cardiogenic shock Echocardiogram 07/10/2022 shows EF <20%, moderate pericardial effusion with moderate MR  and TR. RHC showed nonobstructive CAD, severely elevated filling pressures with low Fick CO/CI (2.7/1.4). HF due to sarcoidosis. Heart failure team primary - Management per HF service.  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 AKI Iron deficiency anemia -managed by HF and hospitalist service  Please call PCCM service back when he is closer to his surgery date.  Signature:   Chilton Greathouse MD [facility] See Amion for pager  If no response to pager , please call [phone] until 7pm After 7:00 pm call Elink  [phone] 08/16/2022, 2:41 PM

## 2022-08-16 NOTE — Progress Notes (Signed)
ANTICOAGULATION CONSULT NOTE  Pharmacy Consult for Heparin (Eliquis on hold) Indication: atrial fibrillation  Allergies  Allergen Reactions   Amiodarone     Severe tremors   Percocet [Oxycodone-Acetaminophen] Itching    Patient Measurements: Height: 5\' 7"  (170.2 cm) Weight: 74 kg (163 lb 2.3 oz) IBW/kg (Calculated) : 66.1 Heparin Dosing Weight: total weight  Vital Signs: Temp: 98.5 F (36.9 C) (07/31 0753) Temp Source: Oral (07/31 0753) BP: 94/57 (07/31 0753) Pulse Rate: 99 (07/31 0835)  Labs: Recent Labs    08/14/22 0208 08/15/22 0500 08/16/22 0500 08/16/22 0640  HGB 11.9* 11.8* 10.7*  --   HCT 37.2* 36.9* 33.4*  --   PLT 176 198 184  --   APTT 64*  --   --   --   HEPARINUNFRC 0.45 0.57 >1.10* 0.30  CREATININE 1.13 0.99 0.88  --     Estimated Creatinine Clearance: 84.5 mL/min (by C-G formula based on SCr of 0.88 mg/dL).   Medical History: Past Medical History:  Diagnosis Date   CAD (coronary artery disease)    CHF (congestive heart failure) (HCC)    GERD (gastroesophageal reflux disease)    Hyperlipidemia    Hypertension    Systolic heart failure (HCC) 2021   LVEF 18%, RVEF 38% on cardiac MRI 12/19/2019. possible cardiac sarcoidosis.   Wide-complex tachycardia 2021   wears LifeVest    Medications:  Scheduled:   atorvastatin  80 mg Oral Daily   chlorhexidine  15 mL Mouth/Throat BID   Chlorhexidine Gluconate Cloth  6 each Topical Daily   digoxin  0.125 mg Oral Daily   dorzolamide-timolol  1 drop Both Eyes BID   feeding supplement  237 mL Oral BID BM   folic acid  1 mg Oral Daily   latanoprost  1 drop Both Eyes QHS   [START ON 08/19/2022] methotrexate  20 mg Oral Q Sat   mexiletine  250 mg Oral BID   mometasone-formoterol  2 puff Inhalation BID   multivitamin with minerals  1 tablet Oral Daily   pantoprazole  40 mg Oral QAC breakfast   sodium chloride flush  10-40 mL Intracatheter Q12H   sodium chloride flush  10-40 mL Intracatheter Q12H   sodium  chloride flush  3 mL Intravenous Q12H   sodium chloride flush  3 mL Intravenous Q12H   umeclidinium bromide  1 puff Inhalation Daily   Infusions:   sodium chloride Stopped (08/11/22 0622)   sodium chloride Stopped (08/12/22 1638)   heparin 1,000 Units/hr (08/15/22 1434)   milrinone 0.375 mcg/kg/min (08/16/22 1102)   PRN: sodium chloride, sodium chloride, acetaminophen, albuterol, ondansetron (ZOFRAN) IV, mouth rinse, sodium chloride flush, sodium chloride flush, sodium chloride flush, sodium chloride flush  Assessment: 57 yo male on apixaban for afib, recent CVA felt to be cardioembolic. Presents for cath, work up for LVAD. Last dose of apixaban 7/25 at 07:30, Hgb 11.9, Plts WNL,   Pharmacy consulted to transition apixaban to heparin.  aPTT and heparin level correlating so swithed to heparin level only yesterday. Heparin level 0.3 at goal on heparin drip 1000 uts/hr  CBC stable, no bleeding  Recent CVA will try to keep heparin level < 0.5  Goal of Therapy:  Heparin level 0.3-0.5 Monitor platelets by anticoagulation protocol: Yes   Plan:  Continue heparin 1000 units/h Daily  heparin level, CBC Monitor s/s bleeding     Leota Sauers Pharm.D. CPP, BCPS Clinical Pharmacist [phone] 08/16/2022 3:11 PM   Please check AMION for all [facility]  numbers 08/16/2022

## 2022-08-16 NOTE — Progress Notes (Signed)
Advanced Heart Failure Rounding Note  PCP-Cardiologist: Norman Herrlich, MD   Subjective:    07/25: Admit with cardiogenic shock. Started milrinone and NE.  CO-OX 56% on 0.25 Milrinone  CVP 5.   Feels much better after IV lasix yesterday. Less dyspnea.   R/LHC 07/25 Findings: Ao = 98/73 (79) LV = 101/39 RA = 19 RV = 76/21 PA = 80/42 (57) PCW = 47 (v = 55) Fick cardiac output/index = 2.7/1.4 PVR = 3.0 WU Ao sat = 96% PA sat = 41%, 39% PAPi = 2.0   Assessment: 1. Mild non-obstructive CAD 2. Severe NICM with severely elevated filling pressures and low output state consistent with cardiogenic shock  Objective:   Weight Range: 74 kg Body mass index is 25.55 kg/m.   Vital Signs:   Temp:  [97.6 F (36.4 C)-98.5 F (36.9 C)] 98.5 F (36.9 C) (07/31 0753) Pulse Rate:  [83-99] 99 (07/31 0835) Resp:  [16-20] 17 (07/31 0753) BP: (89-113)/(57-92) 94/57 (07/31 0753) SpO2:  [92 %-98 %] 95 % (07/31 0817) Weight:  [74 kg] 74 kg (07/31 0705) Last BM Date : 08/15/22  Weight change: Filed Weights   08/14/22 0600 08/15/22 0610 08/16/22 0705  Weight: 72.9 kg 74.4 kg 74 kg    Intake/Output:   Intake/Output Summary (Last 24 hours) at 08/16/2022 1038 Last data filed at 08/16/2022 0924 Gross per 24 hour  Intake 580 ml  Output 2225 ml  Net -1645 ml      Physical Exam   General:  Well appearing.  HEENT: normal Neck: supple. no JVD. Carotids 2+ bilat; no bruits.  Cor: PMI nondisplaced. Regular rate & rhythm. No rubs, gallops or murmurs. Lungs: clear Abdomen: soft, nontender, nondistended.  Extremities: no cyanosis, clubbing, rash, edema Neuro: alert & orientedx3. Affect pleasant    Telemetry   V paced 80s-90s  EKG    N/A   Labs    CBC Recent Labs    08/15/22 0500 08/16/22 0500  WBC 8.8 6.7  HGB 11.8* 10.7*  HCT 36.9* 33.4*  MCV 103.9* 106.0*  PLT 198 184   Basic Metabolic Panel Recent Labs    69/62/95 0500 08/16/22 0500  NA 135 133*  K  4.0 4.3  CL 98 96*  CO2 30 30  GLUCOSE 133* 83  BUN 22* 22*  CREATININE 0.99 0.88  CALCIUM 9.1 8.6*  MG 2.3 2.1   Liver Function Tests No results for input(s): "AST", "ALT", "ALKPHOS", "BILITOT", "PROT", "ALBUMIN" in the last 72 hours.  No results for input(s): "LIPASE", "AMYLASE" in the last 72 hours. Cardiac Enzymes No results for input(s): "CKTOTAL", "CKMB", "CKMBINDEX", "TROPONINI" in the last 72 hours.  BNP: BNP (last 3 results) Recent Labs    07/11/22 0209 07/26/22 1211 08/10/22 1142  BNP 1,588.2* 1,723.6* 1,905.0*    D-Dimer No results for input(s): "DDIMER" in the last 72 hours. Hemoglobin A1C No results for input(s): "HGBA1C" in the last 72 hours.  Fasting Lipid Panel No results for input(s): "CHOL", "HDL", "LDLCALC", "TRIG", "CHOLHDL", "LDLDIRECT" in the last 72 hours.  Thyroid Function Tests No results for input(s): "TSH", "T4TOTAL", "T3FREE", "THYROIDAB" in the last 72 hours.  Invalid input(s): "FREET3"   Other results:   Imaging    No results found.   Medications:     Scheduled Medications:  atorvastatin  80 mg Oral Daily   chlorhexidine  15 mL Mouth/Throat BID   Chlorhexidine Gluconate Cloth  6 each Topical Daily   digoxin  0.125 mg Oral Daily  dorzolamide-timolol  1 drop Both Eyes BID   feeding supplement  237 mL Oral BID BM   folic acid  1 mg Oral Daily   latanoprost  1 drop Both Eyes QHS   [START ON 08/19/2022] methotrexate  20 mg Oral Q Sat   mexiletine  250 mg Oral BID   mometasone-formoterol  2 puff Inhalation BID   multivitamin with minerals  1 tablet Oral Daily   pantoprazole  40 mg Oral QAC breakfast   sodium chloride flush  10-40 mL Intracatheter Q12H   sodium chloride flush  10-40 mL Intracatheter Q12H   sodium chloride flush  3 mL Intravenous Q12H   sodium chloride flush  3 mL Intravenous Q12H   umeclidinium bromide  1 puff Inhalation Daily    Infusions:  sodium chloride Stopped (08/11/22 0622)   sodium chloride  Stopped (08/12/22 1638)   heparin 1,000 Units/hr (08/15/22 1434)   milrinone 0.25 mcg/kg/min (08/16/22 0437)    PRN Medications: sodium chloride, sodium chloride, acetaminophen, albuterol, ondansetron (ZOFRAN) IV, mouth rinse, sodium chloride flush, sodium chloride flush, sodium chloride flush, sodium chloride flush    Patient Profile   Mr Zoll is a 57 y.o. 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   Assessment/Plan  1.  Acute on chronic Systolic HF-->Cardiogenic Shock  - Diagnosed 11/2019. Presented with VT. LHC 70% LAD  -- cMRI 12/21 concerning for sarcoid and EF 18%.  - PET 2/22 at [facility] EF 25% + active sarcoid - Echo 08/26/20 EF < 20% severely dilated LV RV mildly decreased.  - CPX 8/22. Unable to complete PFTs due to hypoventilation and poor effort. pVO2 16.1 (49% predicted). Slope 19 RER: 1.17 PETCO2 59 - [employer] CRT-D upgrade in 06/08/21 - Echo 11/07/21: EF 20-25% RV mild to moderately reduced.  - Echo 07/10/22: EF <20%, RV okay, mod pericardial effusion, mod MR/TR - Admitted 07/25 with cardiogenic shock. - RHC: Nonobstructive CAD, severely elevated filling pressures and low Fick CO/CI (2.7/1.4) - CO-OX 56% with calculated Fick of 2.1 on milrinone 0.25. Increase milrinone to 0.375 to help optimize him for surgery.  - CVP 5. No diuretic today.  - Worry that he is inotrope dependent.  - Continue digoxin 0.125 mcg daily.  - no BP room for GDMT - VAD workup underway. Discussed in [facility] 07/29. Likely not stable enough to discharge home before VAD. Will need dental extraction prior to surgery.  [name] contacted.    2.  Hx of stroke -Admitted with acute L sided weakness 6/24, CTA revealed an acute right M1 occlusion. S/p TPA and mechanical clot extraction. No residual deficits  -Likely cardioembolic in setting of severe LV dysfunction. 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   - Off eliquis  - Continue heparin drip  until all procedures completed.   3. 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 Continue mexilitene  - now s/p ICD. No VT on interrogation.    4. CAD - LHC 12/07/19 70-% LAD, no intervention - LHC mild non obstructive CAD.  - Continue statin and aspirin.    5. Cardiac sarcoid - PET 2/22 at [facility] EF 25% + active sarcoid - Has completed prednisone.  - Takes MTX 20 weekly, Saturday. Continue folic acid - Previously discussed EM biopsy but patient is reluctant  - Needs repeat PET, wasn't able to schedule 10/23 as insurance would not cover. Now has medicaid and medicare.  Plan to reschedule once discharged   6. 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 AKI - Creatinine baseline around 1.1, Scr up to 1.6. Now at baseline.  - suspect cardiorenal   8. Iron deficiency anemia - recent T sat 15%, scheduled for OP feraheme. Will complete inpatient after workup complete.    9. Pulmonary  - PFTs with severe obstructive defect, response to bronchodilator -FEV1 1.04L, FEV1/FVC 48% -Consulted pulmonary to assist with pre VAD optimization. Appreciate assistance  Length of Stay: 6  Tateanna Bach N, PA-C  08/16/2022, 10:38 AM  Advanced Heart Failure Team Pager [phone] (M-F; 7a - 5p)  Please contact [facility] for night-coverage after hours (5p -7a ) and weekends on [email or web address]

## 2022-08-16 NOTE — Plan of Care (Signed)
  Problem: Education: Goal: Understanding of CV disease, CV risk reduction, and recovery process will improve Outcome: Progressing   Problem: Cardiovascular: Goal: Ability to achieve and maintain adequate cardiovascular perfusion will improve Outcome: Progressing   Problem: Nutrition: Goal: Adequate nutrition will be maintained Outcome: Progressing   Problem: Coping: Goal: Level of anxiety will decrease Outcome: Progressing

## 2022-08-17 DIAGNOSIS — Z8673 Personal history of transient ischemic attack (TIA), and cerebral infarction without residual deficits: Secondary | ICD-10-CM | POA: Diagnosis not present

## 2022-08-17 DIAGNOSIS — Z515 Encounter for palliative care: Secondary | ICD-10-CM | POA: Diagnosis not present

## 2022-08-17 DIAGNOSIS — I25118 Atherosclerotic heart disease of native coronary artery with other forms of angina pectoris: Secondary | ICD-10-CM | POA: Diagnosis not present

## 2022-08-17 DIAGNOSIS — I5023 Acute on chronic systolic (congestive) heart failure: Secondary | ICD-10-CM | POA: Diagnosis not present

## 2022-08-17 NOTE — Progress Notes (Signed)
Pt seen and examined, remains medically stable in good spirits, on IV heparin and milrinone, stable off diuretics -Ongoing LVAD workup, Dental Surgery eval pending  Charles Cove, MD

## 2022-08-17 NOTE — Plan of Care (Signed)
Plan of care has reviewed.  Problem: Cardiovascular: Dx. HTN, chronic systolic HF, multivessel CAD, VT, Afib, embolic CVA. Goal: Ability to achieve and maintain adequate cardiovascular perfusion will improve Outcome: Progressing: PT is stable hemodynamically, BP 100/65-101/61 mmHg, HR 60-90s. CVP 9-11 mmHg. V-paced on the monitor.    Problem: Clinical Measurements: ER with worsen dyspnea and volume over load, tachypnea, pulmonary infiltration on x-ray.  Goal: Respiratory complications will improve Outcome: Progressing: Currently on room air, no respiratory distress or SOB, SPO2 94-97%, lungs clear after treated with diuretics.    Problem: Activity: Poor activity toleration due to heart failure, EF 20% with severe LV dilation Goal: Risk for activity intolerance will decrease Outcome: Progressing: Pt gradually increases activity toleration. He is able to ambulate independently in his room. Denies chest pain or SOB. Currently on Milrinone. No acute distress or any complaint overnight. Pt is able to rest well.  We will continue to monitor.    Filiberto Pinks, RN

## 2022-08-17 NOTE — Progress Notes (Signed)
Mobility Specialist Progress Note:   08/17/22 1400  Mobility  Activity Ambulated with assistance in hallway  Level of Assistance Modified independent, requires aide device or extra time  Assistive Device Other (Comment) (IV Pole)  Distance Ambulated (ft) 740 ft  RUE Weight Bearing NWB  Activity Response Tolerated well  Mobility Referral Yes  $Mobility charge 1 Mobility  Mobility Specialist Start Time (ACUTE ONLY) 1417  Mobility Specialist Stop Time (ACUTE ONLY) 1427  Mobility Specialist Time Calculation (min) (ACUTE ONLY) 10 min    Pre Mobility: 90 HR During Mobility: 118 HR Post Mobility:  92 HR  Pt received in chair, agreeable to mobility. Asymptomatic throughout. Pt left in chair with call bell and all needs met.  D'Vante Earlene Plater Mobility Specialist Please contact via Special educational needs teacher or Rehab office at [phone]

## 2022-08-17 NOTE — Progress Notes (Signed)
Patient ID: EZZARD CARESS, male   DOB: [birth year], 57 y.o.   MRN: [id number]    Progress Note from the Palliative Medicine Team at [facility]   Patient Name: Charles Holmes        Date: 08/17/2022 DOB: 1965-08-01  Age: 57 y.o. MRN#: [id number] Attending Physician: [name], MD Primary Care Physician: [name], PA-C Admit Date: 08/10/2022    Extensive chart review has been completed prior to meeting with patient/family  including labs, vital signs, imaging, progress/consult notes, orders, medications and available advance directive documents.   57 y.o. 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 In process for LVAD workup.    This NP assessed patient at the bedside as a follow up for palliative medicine needs and emotional support.  Patient verbalizes a Holmes understanding of his serious acute on chronic systolic heart failure.  He reports feeling stronger today, he is eager and agreeable to participate with physical therapy.    He is hopeful for eventual LVAD therapy, looking toward continued quality of life  He currently remains on IV milrinone, dental surgery evaluation pending  Continued advanced therapies  with ongoing evaluation for LVAD       Education offered today specifically on advance care planning.  We reviewed H POA and living will paperwork.  He asks that it be left at his bedside for him and his daughter to look over.  I placed that paperwork in his patient closet in the room. He speaks to naming his daughter Charles Holmes as H POA.  Education offered on the importance of his conversation with his daughter regarding his values as it specifically relates to quality of life.  He tells me he has had these discussions with his daughter.  We can look to securing those documents over the next few days with the support of spiritual care.  Education offered today regarding  the importance of continued conversation with  his  family and the medical providers regarding overall plan of care and treatment options,  ensuring decisions are within the context of the patients values and GOCs.  Questions and concerns addressed   Discussed with LVAD team   PMT will can continue to support holistically   Time: 50 minutes  Detailed review of medical records ( labs, imaging, vital signs), medically appropriate exam ( MS, skin, resp)   discussed with treatment team, counseling and education to patient, family, staff, documenting clinical information, medication management, coordination of care    Lorinda Creed NP  Palliative Medicine Team Team Phone # [phone] Pager [phone]

## 2022-08-17 NOTE — Progress Notes (Signed)
Physical Therapy Treatment Patient Details Name: Charles Holmes MRN: [id number] DOB: 1965-04-18 Today's Date: 08/17/2022   History of Present Illness Pt is 57 year old presented to [facility] on  08/10/22 for acute on chronic systolic heart failure. Pt being evaluated for LVAD. PMH - CAD, CHF, HLD, HTN, systolic HF, ICD implant, rt CVA    PT Comments  Patient agreeable and eager to walk and participate with PT. He continues to make progress with functional independence and increased activity tolerance this session. Recommend to continue PT to maximize independence and in preparation for possible upcoming procedure.    If plan is discharge home, recommend the following:     Can travel by private vehicle        Equipment Recommendations  None recommended by PT    Recommendations for Other Services       Precautions / Restrictions Precautions Precautions: None Restrictions Weight Bearing Restrictions: No     Mobility  Bed Mobility               General bed mobility comments: not observed as patient sitting up on arrival and post session    Transfers Overall transfer level: Needs assistance Equipment used: None Transfers: Sit to/from Stand Sit to Stand: Modified independent (Device/Increase time)           General transfer comment: patient is able to stand without use of UE support.    Ambulation/Gait Ambulation/Gait assistance: Supervision Gait Distance (Feet): 750 Feet Assistive device: IV Pole Gait Pattern/deviations: Decreased stride length, Step-through pattern Gait velocity: mildly decreased     General Gait Details: patient ambulated in hallway without loss of balance but prefers to use IV pole for comfort. patient reports no dizziness with mobility and no dyspnea with exertion. no standing rest braks required and patient reports feeling generally stronger with mobility efforts today. educated patient on energy conservation strategies, breathing  techniques.   Stairs             Wheelchair Mobility     Tilt Bed    Modified Rankin (Stroke Patients Only)       Balance Overall balance assessment: No apparent balance deficits (not formally assessed)                                          Cognition Arousal/Alertness: Awake/alert Behavior During Therapy: WFL for tasks assessed/performed Overall Cognitive Status: Within Functional Limits for tasks assessed                                          Exercises      General Comments        Pertinent Vitals/Pain Pain Assessment Pain Assessment: No/denies pain    Home Living                          Prior Function            PT Goals (current goals can now be found in the care plan section) Acute Rehab PT Goals Patient Stated Goal: to continue to build strength PT Goal Formulation: With patient Time For Goal Achievement: 08/27/22 Potential to Achieve Goals: Good Progress towards PT goals: Progressing toward goals    Frequency    Min 1X/week  PT Plan Current plan remains appropriate    Co-evaluation              AM-PAC PT "6 Clicks" Mobility   Outcome Measure  Help needed turning from your back to your side while in a flat bed without using bedrails?: None Help needed moving from lying on your back to sitting on the side of a flat bed without using bedrails?: None Help needed moving to and from a bed to a chair (including a wheelchair)?: None Help needed standing up from a chair using your arms (e.g., wheelchair or bedside chair)?: None Help needed to walk in hospital room?: None Help needed climbing 3-5 steps with a railing? : A Little 6 Click Score: 23    End of Session   Activity Tolerance: Patient tolerated treatment well Patient left: with call bell/phone within reach;in chair Nurse Communication: Mobility status PT Visit Diagnosis: Muscle weakness (generalized) (M62.81)      Time: 1610-9604 PT Time Calculation (min) (ACUTE ONLY): 17 min  Charges:    $Therapeutic Activity: 8-22 mins PT General Charges $$ ACUTE PT VISIT: 1 Visit                    Donna Bernard, PT, MPT    Ina Homes 08/17/2022, 1:18 PM

## 2022-08-17 NOTE — Progress Notes (Signed)
ANTICOAGULATION CONSULT NOTE  Pharmacy Consult for Heparin (Eliquis on hold) Indication: atrial fibrillation  Allergies  Allergen Reactions   Amiodarone     Severe tremors   Percocet [Oxycodone-Acetaminophen] Itching    Patient Measurements: Height: 5\' 7"  (170.2 cm) Weight: 75.2 kg (165 lb 11.2 oz) IBW/kg (Calculated) : 66.1 Heparin Dosing Weight: total weight  Vital Signs: Temp: 98.6 F (37 C) (08/01 1100) Temp Source: Oral (08/01 1100) BP: 95/62 (08/01 1100) Pulse Rate: 81 (08/01 1100)  Labs: Recent Labs    08/15/22 0500 08/16/22 0500 08/16/22 0640 08/17/22 0227  HGB 11.8* 10.7*  --  10.4*  HCT 36.9* 33.4*  --  33.1*  PLT 198 184  --  204  HEPARINUNFRC 0.57 >1.10* 0.30 0.35  CREATININE 0.99 0.88  --  1.16    Estimated Creatinine Clearance: 64.1 mL/min (by C-G formula based on SCr of 1.16 mg/dL).   Medical History: Past Medical History:  Diagnosis Date   CAD (coronary artery disease)    CHF (congestive heart failure) (HCC)    GERD (gastroesophageal reflux disease)    Hyperlipidemia    Hypertension    Systolic heart failure (HCC) 2021   LVEF 18%, RVEF 38% on cardiac MRI 12/19/2019. possible cardiac sarcoidosis.   Wide-complex tachycardia 2021   wears LifeVest    Medications:  Scheduled:   atorvastatin  80 mg Oral Daily   chlorhexidine  15 mL Mouth/Throat BID   Chlorhexidine Gluconate Cloth  6 each Topical Daily   dorzolamide-timolol  1 drop Both Eyes BID   feeding supplement  237 mL Oral BID BM   folic acid  1 mg Oral Daily   latanoprost  1 drop Both Eyes QHS   [START ON 08/19/2022] methotrexate  20 mg Oral Q Sat   mexiletine  250 mg Oral BID   mometasone-formoterol  2 puff Inhalation BID   multivitamin with minerals  1 tablet Oral Daily   pantoprazole  40 mg Oral QAC breakfast   sodium chloride flush  10-40 mL Intracatheter Q12H   sodium chloride flush  10-40 mL Intracatheter Q12H   umeclidinium bromide  1 puff Inhalation Daily   Infusions:    heparin 1,000 Units/hr (08/16/22 2200)   milrinone 0.375 mcg/kg/min (08/17/22 0556)   PRN: acetaminophen, albuterol, ondansetron (ZOFRAN) IV, mouth rinse, sodium chloride flush, sodium chloride flush  Assessment: 57 yo male on apixaban for afib, recent CVA felt to be cardioembolic. Presents for cath, work up for LVAD. Last dose of apixaban 7/25 at 07:30, Hgb 11.9, Plts WNL,   Pharmacy consulted to transition apixaban to heparin.  aPTT and heparin level correlating so swithed to heparin level only yesterday. Heparin level 0.35 at goal on heparin drip 1000 uts/hr  CBC stable, no bleeding  Recent CVA will try to keep heparin level < 0.5  Goal of Therapy:  Heparin level 0.3-0.5 Monitor platelets by anticoagulation protocol: Yes   Plan:  Continue heparin 1000 units/h Daily  heparin level, CBC Monitor s/s bleeding     Leota Sauers Pharm.D. CPP, BCPS Clinical Pharmacist [phone] 08/17/2022 2:45 PM   Please check AMION for all [facility] numbers 08/17/2022

## 2022-08-17 NOTE — Progress Notes (Signed)
Advanced Heart Failure Rounding Note  PCP-Cardiologist: Norman Herrlich, MD   Subjective:    07/25: Admit with cardiogenic shock. Started milrinone and NE.  CO-OX 62% with Fick CI on 0.375 of milrinone.  CVP 5.  Feeling well. Dyspnea improved. Has been ambulating halls.   R/LHC 07/25 Findings: Ao = 98/73 (79) LV = 101/39 RA = 19 RV = 76/21 PA = 80/42 (57) PCW = 47 (v = 55) Fick cardiac output/index = 2.7/1.4 PVR = 3.0 WU Ao sat = 96% PA sat = 41%, 39% PAPi = 2.0   Assessment: 1. Mild non-obstructive CAD 2. Severe NICM with severely elevated filling pressures and low output state consistent with cardiogenic shock  Objective:   Weight Range: 75.2 kg Body mass index is 25.95 kg/m.   Vital Signs:   Temp:  [97.7 F (36.5 C)-98.6 F (37 C)] 98.6 F (37 C) (08/01 1100) Pulse Rate:  [59-93] 81 (08/01 1100) Resp:  [17-20] 17 (08/01 1100) BP: (95-107)/(60-70) 95/62 (08/01 1100) SpO2:  [91 %-97 %] 95 % (08/01 1100) Weight:  [75.2 kg] 75.2 kg (08/01 0557) Last BM Date : 08/15/22  Weight change: Filed Weights   08/15/22 0610 08/16/22 0705 08/17/22 0557  Weight: 74.4 kg 74 kg 75.2 kg    Intake/Output:   Intake/Output Summary (Last 24 hours) at 08/17/2022 1118 Last data filed at 08/17/2022 0445 Gross per 24 hour  Intake 1447.83 ml  Output 1100 ml  Net 347.83 ml      Physical Exam  CVP 5-6 General:  Well appearing.  HEENT: normal Neck: supple. no JVD. Carotids 2+ bilat; no bruits.  Cor: PMI nondisplaced. Regular rate & rhythm. No rubs, gallops or murmurs. Lungs: clear Abdomen: soft, nontender, nondistended.  Extremities: no cyanosis, clubbing, rash, edema, RUE PICC Neuro: alert & orientedx3. Affect pleasant    Telemetry   V paced 80s-90s  EKG    N/A   Labs    CBC Recent Labs    08/16/22 0500 08/17/22 0227  WBC 6.7 8.4  HGB 10.7* 10.4*  HCT 33.4* 33.1*  MCV 106.0* 104.4*  PLT 184 204   Basic Metabolic Panel Recent Labs     08/16/22 0500 08/17/22 0227  NA 133* 135  K 4.3 4.8  CL 96* 98  CO2 30 29  GLUCOSE 83 91  BUN 22* 25*  CREATININE 0.88 1.16  CALCIUM 8.6* 8.9  MG 2.1 2.1   Liver Function Tests No results for input(s): "AST", "ALT", "ALKPHOS", "BILITOT", "PROT", "ALBUMIN" in the last 72 hours.  No results for input(s): "LIPASE", "AMYLASE" in the last 72 hours. Cardiac Enzymes No results for input(s): "CKTOTAL", "CKMB", "CKMBINDEX", "TROPONINI" in the last 72 hours.  BNP: BNP (last 3 results) Recent Labs    07/11/22 0209 07/26/22 1211 08/10/22 1142  BNP 1,588.2* 1,723.6* 1,905.0*    D-Dimer No results for input(s): "DDIMER" in the last 72 hours. Hemoglobin A1C No results for input(s): "HGBA1C" in the last 72 hours.  Fasting Lipid Panel No results for input(s): "CHOL", "HDL", "LDLCALC", "TRIG", "CHOLHDL", "LDLDIRECT" in the last 72 hours.  Thyroid Function Tests No results for input(s): "TSH", "T4TOTAL", "T3FREE", "THYROIDAB" in the last 72 hours.  Invalid input(s): "FREET3"   Other results:   Imaging    No results found.   Medications:     Scheduled Medications:  atorvastatin  80 mg Oral Daily   chlorhexidine  15 mL Mouth/Throat BID   Chlorhexidine Gluconate Cloth  6 each Topical Daily   digoxin  0.125 mg Oral Daily   dorzolamide-timolol  1 drop Both Eyes BID   feeding supplement  237 mL Oral BID BM   folic acid  1 mg Oral Daily   latanoprost  1 drop Both Eyes QHS   [START ON 08/19/2022] methotrexate  20 mg Oral Q Sat   mexiletine  250 mg Oral BID   mometasone-formoterol  2 puff Inhalation BID   multivitamin with minerals  1 tablet Oral Daily   pantoprazole  40 mg Oral QAC breakfast   sodium chloride flush  10-40 mL Intracatheter Q12H   sodium chloride flush  10-40 mL Intracatheter Q12H   sodium chloride flush  3 mL Intravenous Q12H   sodium chloride flush  3 mL Intravenous Q12H   umeclidinium bromide  1 puff Inhalation Daily    Infusions:  sodium chloride  Stopped (08/11/22 0622)   sodium chloride Stopped (08/12/22 1638)   heparin 1,000 Units/hr (08/16/22 2200)   milrinone 0.375 mcg/kg/min (08/17/22 0556)    PRN Medications: sodium chloride, sodium chloride, acetaminophen, albuterol, ondansetron (ZOFRAN) IV, mouth rinse, sodium chloride flush, sodium chloride flush, sodium chloride flush, sodium chloride flush    Patient Profile   Mr Ricciardelli is a 57 y.o. 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   Assessment/Plan  1.  Acute on chronic Systolic HF-->Cardiogenic Shock  - Diagnosed 11/2019. Presented with VT. LHC 70% LAD  -- cMRI 12/21 concerning for sarcoid and EF 18%.  - PET 2/22 at [facility] EF 25% + active sarcoid - Echo 08/26/20 EF < 20% severely dilated LV RV mildly decreased.  - CPX 8/22. Unable to complete PFTs due to hypoventilation and poor effort. pVO2 16.1 (49% predicted). Slope 19 RER: 1.17 PETCO2 59 - Med[employer] CRT-D upgrade in 06/08/21 - Echo 11/07/21: EF 20-25% RV mild to moderately reduced.  - Echo 07/10/22: EF <20%, RV okay, mod pericardial effusion, mod MR/TR - Admitted 07/25 with cardiogenic shock. - RHC: Nonobstructive CAD, severely elevated filling pressures and low Fick CO/CI (2.7/1.4) - CO-OX 62% with calculated Fick of 2.6 on milrinone 0.375.  - CVP 5-6. No diuretic today - Worry that he is inotrope dependent.  - Dig level 0.9. Stop digoxin. Can add back if needed post VAD. - no BP room for GDMT - VAD workup underway. Discussed in [facility] 07/29. Likely not stable enough to discharge home before VAD. Will need dental extraction prior to surgery.  [name] contacted.    2.  Hx of stroke -Admitted 06/24 w/ R MCA stroke. S/p TPA and mechanical clot extraction. No residual deficits  -Likely cardioembolic in setting of severe LV dysfunction. 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   - Off eliquis  - Continue heparin drip until all procedures completed.   3. 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 Continue mexilitene  - now s/p ICD. No VT on interrogation.    4. CAD - LHC 12/07/19 70-% LAD, no intervention - LHC mild non obstructive CAD.  - Continue statin and aspirin.    5. Cardiac sarcoid - PET 2/22 at [facility] EF 25% + active sarcoid - Has completed prednisone.  - Takes MTX 20 weekly, Saturday. Continue folic acid - Previously discussed EM biopsy but patient is reluctant  - Needs repeat PET, wasn't able to schedule 10/23 as insurance would not cover. Now has medicaid and medicare. Plan to reschedule once discharged  6. 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 AKI - Creatinine baseline around 1.1, Scr up to 1.6. Now at baseline.  - suspect cardiorenal   8. Iron deficiency anemia - recent T sat 15%, scheduled for OP feraheme. Will complete inpatient after workup complete.    9. Pulmonary  - PFTs with severe obstructive defect, response to bronchodilator -FEV1 1.04L, FEV1/FVC 48% -Consulted pulmonary to assist with pre VAD optimization. Appreciate assistance  Length of Stay: 7  Nayda Riesen N, PA-C  08/17/2022, 11:18 AM  Advanced Heart Failure Team Pager [phone] (M-F; 7a - 5p)  Please contact [facility] for night-coverage after hours (5p -7a ) and weekends on [email or web address]

## 2022-08-17 NOTE — Progress Notes (Signed)
Brief MCS Note:  VAD Coordinator's met with pt at bedside to review plan for VAD implant. Awaiting dental clearance to assess need for extraction. Pt states he has no questions about upcoming VAD surgery and enjoyed meeting one of our current VAD pt's yesterday.   Simmie Davies RN, BSN VAD Coordinator 24/7 Pager [phone]

## 2022-08-18 ENCOUNTER — Telehealth (HOSPITAL_COMMUNITY): Payer: Self-pay | Admitting: Licensed Clinical Social Worker

## 2022-08-18 DIAGNOSIS — Z7189 Other specified counseling: Secondary | ICD-10-CM | POA: Diagnosis not present

## 2022-08-18 DIAGNOSIS — I5023 Acute on chronic systolic (congestive) heart failure: Secondary | ICD-10-CM | POA: Diagnosis not present

## 2022-08-18 DIAGNOSIS — Z515 Encounter for palliative care: Secondary | ICD-10-CM | POA: Diagnosis not present

## 2022-08-18 MED ORDER — FUROSEMIDE 20 MG PO TABS
20.0000 mg | ORAL_TABLET | Freq: Every day | ORAL | Status: DC
  Administered 2022-08-18 – 2022-08-20 (×3): 20 mg via ORAL
  Filled 2022-08-18 (×3): qty 1

## 2022-08-18 NOTE — Progress Notes (Signed)
Pt seen and examined, remains stable, no events overnight, awaiting Dental eval -will reach out to [name] today  [name], MD

## 2022-08-18 NOTE — Telephone Encounter (Signed)
CSW contacted patient's primary [name] to discuss caregiver role and explain the LVAD process. CSW reviewed role and responsibilities for caregiver and 24/7 need post hospital stay. CSW reviewed questions below:  Caregiver questions Please explain what you hope will be improved about your life and loved one's life as a result of receiving the LVAD? "Hoping that he can get back to his old self by walking and enjoying life" What is your biggest concern or fear about caregiving with an LVAD patient? Nothing at the moment   What is your plan for availability to provide care 24/7 x2 weeks post op and dressing changes ongoing? Will be available Who is the relief/backup caregiver and what is their availability? Dwayne (Charles Holmes's husband)  and  Dorathy Daft (daughter). Preferred method of learning? Hands on   Do you drive? yes Do you think you can do this? yes Is there anything that concerns about caregiving? no  Do you provide caregiving to anyone else? no     Caregiver's current level of motivation to prepare for LVAD: Motivated for improved health Caregiver's present level of consent for LVAD: Ready  Charles Holmes verbalizes understanding of caregiver role and denies any questions or concerns at this time. Lasandra Beech, LCSW, CCSW-MCS [phone]

## 2022-08-18 NOTE — Progress Notes (Signed)
ANTICOAGULATION CONSULT NOTE  Pharmacy Consult for Heparin (Eliquis on hold) Indication: atrial fibrillation  Allergies  Allergen Reactions   Amiodarone     Severe tremors   Percocet [Oxycodone-Acetaminophen] Itching    Patient Measurements: Height: 5\' 7"  (170.2 cm) Weight: 76.1 kg (167 lb 11.2 oz) IBW/kg (Calculated) : 66.1 Heparin Dosing Weight: total weight  Vital Signs: Temp: 98.3 F (36.8 C) (08/02 0947) Temp Source: Oral (08/02 0947) BP: 100/67 (08/02 0947) Pulse Rate: 92 (08/02 0947)  Labs: Recent Labs    08/16/22 0500 08/16/22 0640 08/17/22 0227 08/18/22 0424  HGB 10.7*  --  10.4* 10.2*  HCT 33.4*  --  33.1* 31.9*  PLT 184  --  204 165  HEPARINUNFRC >1.10* 0.30 0.35 0.28*  CREATININE 0.88  --  1.16 0.91    Estimated Creatinine Clearance: 81.7 mL/min (by C-G formula based on SCr of 0.91 mg/dL).   Medical History: Past Medical History:  Diagnosis Date   CAD (coronary artery disease)    CHF (congestive heart failure) (HCC)    GERD (gastroesophageal reflux disease)    Hyperlipidemia    Hypertension    Systolic heart failure (HCC) 2021   LVEF 18%, RVEF 38% on cardiac MRI 12/19/2019. possible cardiac sarcoidosis.   Wide-complex tachycardia 2021   wears LifeVest    Medications:  Scheduled:   atorvastatin  80 mg Oral Daily   chlorhexidine  15 mL Mouth/Throat BID   Chlorhexidine Gluconate Cloth  6 each Topical Daily   dorzolamide-timolol  1 drop Both Eyes BID   feeding supplement  237 mL Oral BID BM   folic acid  1 mg Oral Daily   latanoprost  1 drop Both Eyes QHS   [START ON 08/19/2022] methotrexate  20 mg Oral Q Sat   mexiletine  250 mg Oral BID   mometasone-formoterol  2 puff Inhalation BID   multivitamin with minerals  1 tablet Oral Daily   pantoprazole  40 mg Oral QAC breakfast   sodium chloride flush  10-40 mL Intracatheter Q12H   sodium chloride flush  10-40 mL Intracatheter Q12H   umeclidinium bromide  1 puff Inhalation Daily   Infusions:    heparin 1,000 Units/hr (08/18/22 0624)   milrinone 0.375 mcg/kg/min (08/18/22 0522)   PRN: acetaminophen, albuterol, ondansetron (ZOFRAN) IV, mouth rinse, sodium chloride flush, sodium chloride flush  Assessment: 57 yo male on apixaban for afib, recent CVA felt to be cardioembolic. Presents for cath, work up for LVAD. Last dose of apixaban 7/25 at 07:30, Hgb 11.9, Plts WNL,   Pharmacy consulted to transition apixaban to heparin.  aPTT and heparin level correlating so swithed to heparin level only yesterday. Heparin level drift down 0.28 slightly <  goal on heparin drip 1000 uts/hr  CBC stable, no bleeding  Recent CVA will try to keep heparin level < 0.5  Goal of Therapy:  Heparin level 0.3-0.5 Monitor platelets by anticoagulation protocol: Yes   Plan:  Increase heparin 1050 units/h Daily  heparin level, CBC Monitor s/s bleeding     Leota Sauers Pharm.D. CPP, BCPS Clinical Pharmacist [phone] 08/18/2022 10:36 AM   Please check AMION for all [facility] numbers 08/18/2022

## 2022-08-18 NOTE — Progress Notes (Signed)
LVAD Initial Psychosocial Screening  Date/Time Initiated:  08-17-22 9:25 am Referral Source: Simmie Davies, LVAD Coordinator  Referral Reason:  LVAD Implantation Source of Information:  Pt. And chart review  Demographics Name:  Charles Holmes Address:  [address] [place] [address] Cell: [phone] Marital Status:  Divorced (30 years ago) Faith:  Christian Primary Language:  English DOB:  07/03/1965  Medical & Follow-up Adherence to Medical regimen/INR checks: compliant  Medication adherence: compliant  Physician/Clinic Appointment Attendance: compliant   Advance Directives: Do you have a Living Will or Medical POA? No  Would you like to complete a Living Will and Medical POA prior to surgery?  Pending Do you have Goals of Care? Yes  Have you had a consult with the Palliative Care Team at [facility]? Yes  Psychological Health Appearance:  In hospital gown Mental Status:  Alert, oriented Eye Contact:  Good Thought Content:  Coherent Speech:  Logical/coherent Mood:  Appropriate  Affect:  Appropriate to circumstance Insight:  Good Judgement: Unimpaired Interaction Style:  Engaged  Family/Social Information Who lives in your home? Name:   Relationship:   Lives alone  Other family members/support persons in your life? Name:   Relationship:   [name]  Daughter [phone] Sister   Rod Mae Cousin  [phone] William Hamburger [employer]  [phone]  Caregiving Needs Who is the primary caregiver? Rod Mae Health status:  good Do you drive?  yes Do you work?  No Physical Limitations:  none Do you have other care giving responsibilities?  none Contact number: [phone]  Who is the secondary caregiver? [name] Health status:  good/pregnant due in early September Do you drive?  yes Do you work?  yes Physical Limitations:  none Do you have other care giving responsibilities?  Yes young children Contact number:  [phone]  Home Environment/Personal Care Do you have reliable phone service? Yes   Do you own or rent your home? rent Number of steps into the home? 2 steps How many levels in the home? 1 level Assistive devices in the home? none Electrical needs for LVAD (3 prong outlets)? yes Second hand smoke exposure in the home? no Travel distance from [facility]? 35-40 minutes  Community Are you active with community agencies/resources/homecare? No  Are you active in a church, synagogue, mosque or other faith based community? No  What other sources do you have for spiritual support? family Are you active in any clubs or social organizations? none What do you do for fun?  Hobbies?  Interests? Used to play golf  Education/Work Information What is the last grade of school you completed?  High School Preferred method of learning?  Hands on Do you have any problems with reading or writing?  No Are you currently employed?  No  When were you last employed? 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 No Are you interested in job training or learning new skills?  No Did you serve in the military? No    Financial Information What is your source of income? Social Security Disability  Do you have difficulty meeting your monthly expenses? Yes How do you cope with this? Budgeting Can you budget for the monthly cost for dressing supplies post procedure? Yes  Primary Health insurance:  [facility] Medicare Secondary Insurance:  What are  your prescription co-pays? varies Do you use mail order for your prescriptions?  No Have you ever had to refuse medication due to cost?  No Have you applied for Medicaid?  pending Have you applied for Social Security Disability (SSI)   current  Medical Information Briefly describe why you are here for  evaluation: "Diagnosed in 2021 on my Daughters Birthday" Do you have a PCP or other medical provider? [name], PA-C Are you able to complete your ADL's? yes Do you have a history of trauma, physical, emotional, or sexual abuse? none Do you have any family history of heart problems? Mom and Dad Do you smoke now or past usage? past usage    Quit date: 2008 after 24 years Do you drink alcohol now or past usage? past usage    Quit date:  2008  He reports he had 3 DUI's in the past with the last in 2008. He notes that currently he does not have more than 2 beers a month. Are you currently using illegal drugs or misuse of medication or past usage? past usage last used marijuana 5+ years ago Have you ever been treated for substance abuse? Yes      If yes, where and when did you receive treatment? He reports he attended alcohol rehab classes for the DUI's back in 2008.  Mental Health History How have you been feeling in the past year? "Off and on like crap" "The fluid has become a problem lately" Have you ever had any problems with depression, anxiety or other mental health issues? no Do you see a counselor, psychiatrist or therapist?  no If you are currently experiencing problems are you interested in talking with a professional? No Have you or are you taking medications for anxiety/depression or any mental health concerns?  No  What are your coping strategies under stressful situations? He states that he usually will "tough through it" and pray. Are there any other stressors in your life? Financial sometimes Have you had any past or current thoughts of suicide? no How many hours do you sleep at night? 8 hrs How is your appetite? Better since hospitalization Would you be interested in attending the LVAD support group? yes  PHQ2 Depression Scale: 4 Mini-Mental State Examination (MMSE): 30   Legal Do you currently have any legal issues/problems?  No Have you had any legal issues/problems  in the past?  Previuos DUI's   Plan for VAD Implementation Do you know and understand what happens during the VAD surgery? Patient Verbalizes Understanding  of surgery and able to describe details What do you know about the risks and side effect associated with VAD surgery? Patient Verbalizes Understanding  of risks (infection, stroke and death) Explain what will happen right after surgery: Patient Verbalizes Understanding  of OR to ICU and will be intubated What is your plan for transportation for the first 8 weeks post-surgery? (Patients are not recommended to drive post-surgery for 8 weeks)  Driver:   Family members  Do you have airbags in your vehicle?  There is a risk of discharging the device if the airbag were to deploy. What do you know about your diet post-surgery? Patient Verbalizes Understanding  of Heart healthy How do you plan to monitor your medications, current and future?  Has a good system at home with bottles   How do you plan to complete ADL's post-surgery?  Ask for assistance when needed.Will it be difficult to ask for help from your caregivers?  No  Please explain what you hope will be improved about your life as a result of receiving the LVAD? "Add more years to my life and ability to play with my grandchildren" Please tell me your biggest concern or fear about living with the LVAD?  "That it doesn't work and a little scared about opening my chest" Please explain your understanding of how their body will change. Are you worried about these changes? No, as long as it keeps me alive Do you see any barriers to your surgery or follow-up? None  Understanding of LVAD Patient states understanding of the following: Surgical procedures and risks, Electrical need for LVAD (3 prong outlets), Safety precautions with LVAD (water, etc.), LVAD daily self-care (dressing changes, computer check, extra supplies), Outpatient follow up (LVAD clinic appts, monitoring blood thinners), and Need  for Emergency Planning  Discussed and Reviewed with Patient and Caregiver  Patient's current level of motivation to prepare for LVAD: Motivated Patient's present Level of Consent for LVAD: Ready   Education provided to patient/family/caregiver:   Caregiver role and responsibiltiy, Financial planning for LVAD, Role of Clinical Social Worker, and Signs of Depression and Anxiety   Clinical Interventions Needed:    CSW will monitor signs and symptoms of depression and assist with adjustment to life with an LVAD.  CSW will explore eligibility for full Medicaid and assist with application if appropriate. CSW will refer patient for Advanced Directives, if not completed prior to surgery if still wishing to complete.  CSW encouraged attendance with the LVAD Support Group to assist further with adjustment and post implant peer support.  Clinical Impressions/Recommendations:   Patient is a 57 yo male who lives alone and has one adult daughter. He reports he has been divorced for over 30 years. He states he is compliant with medical follow up and medication management. Patient reports he does not currently have an advance directive and would be open to discuss further with the Palliative Care team. Patient's daughter Dorathy Daft and his cousin [name] have agreed to caregivers. Patient reports his plan is to stay with his cousin for the 24/7 2 week stay post hospital and can remain if needed. His daughter and cousin will assist with transport needs and ongoing caregiver needs post implant.   Patient has a reliable phone and his home is on 1 level with needed electrical outlets compatible with VAD needs. He denies any affiliation with a faith based community but noted that his family provides spiritual support. He is not involved with any social clubs or organization and enjoys playing golf.   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 He notes that he has been on disability since 2021  when diagnosed with Heart Failure. He has Medicare and will discuss further eligibility for Medicaid.  He reports using tobacco in the past and quit in 2008 after 24 years. He has a history of alcohol use with multiple DUI's with last in 2008. He reports he will occasionally have a beer with the typical usage of 2 beers a month. He denies any current use of marijuana. He denies any mental health concerns and scored a 4 on the PHQ-2. He denies any cognitive concerns and scored a 30 out of 30 on the MME. Patient has no physical limitations that would impede ability to manage LVAD equipment.  Patient hopes to add years to his life and more time with his grandchildren as his goal for receiving the LVAD. Patient appears to be a good  candidate for LVAD implantation with caregiver support and no other concerns or barriers identified for LVAD implantation.    Shane Crutch, CCSW-MCS [phone]

## 2022-08-18 NOTE — Progress Notes (Signed)
Advanced Heart Failure Rounding Note  PCP-Cardiologist: Norman Herrlich, MD   Subjective:    07/25: Admit with cardiogenic shock. Started milrinone and NE.  Off NE. Remains on Milrinone. CO-OX 61% on 0.375 mcg/kg/min. Feels better on higher dose.   CVP not working correctly but volume status ok on exam. Denies any current dyspnea. No complaints today.     R/LHC 07/25 Findings: Ao = 98/73 (79) LV = 101/39 RA = 19 RV = 76/21 PA = 80/42 (57) PCW = 47 (v = 55) Fick cardiac output/index = 2.7/1.4 PVR = 3.0 WU Ao sat = 96% PA sat = 41%, 39% PAPi = 2.0   Assessment: 1. Mild non-obstructive CAD 2. Severe NICM with severely elevated filling pressures and low output state consistent with cardiogenic shock  Objective:   Weight Range: 76.1 kg Body mass index is 26.27 kg/m.   Vital Signs:   Temp:  [97.4 F (36.3 C)-98.3 F (36.8 C)] 98.3 F (36.8 C) (08/02 0947) Pulse Rate:  [82-94] 92 (08/02 0947) Resp:  [16-18] 18 (08/02 0947) BP: (100-114)/(66-76) 100/67 (08/02 0947) SpO2:  [94 %-97 %] 96 % (08/02 0947) Weight:  [76.1 kg] 76.1 kg (08/02 0623) Last BM Date : 08/16/22  Weight change: Filed Weights   08/16/22 0705 08/17/22 0557 08/18/22 0623  Weight: 74 kg 75.2 kg 76.1 kg    Intake/Output:   Intake/Output Summary (Last 24 hours) at 08/18/2022 1123 Last data filed at 08/18/2022 0900 Gross per 24 hour  Intake 1334.59 ml  Output 1800 ml  Net -465.41 ml      Physical Exam   General:  Well appearing. No respiratory difficulty HEENT: normal Neck: supple. JVD ~8-9 cm w/ v-waves. Carotids 2+ bilat; no bruits. No lymphadenopathy or thyromegaly appreciated. Cor: PMI nondisplaced. Regular rate & rhythm. No rubs, gallops or murmurs. Lungs: clear Abdomen: soft, nontender, nondistended. No hepatosplenomegaly. No bruits or masses. Good bowel sounds. Extremities: no cyanosis, clubbing, rash, edema + RUE PICC w/ ecchymosis surrounding picc site  Neuro: alert & oriented x  3, cranial nerves grossly intact. moves all 4 extremities w/o difficulty. Affect pleasant.    Telemetry   V paced 80s-90s  EKG    N/A   Labs    CBC Recent Labs    08/17/22 0227 08/18/22 0424  WBC 8.4 6.5  HGB 10.4* 10.2*  HCT 33.1* 31.9*  MCV 104.4* 107.8*  PLT 204 165   Basic Metabolic Panel Recent Labs    16/10/96 0227 08/18/22 0424  NA 135 133*  K 4.8 4.6  CL 98 98  CO2 29 28  GLUCOSE 91 96  BUN 25* 22*  CREATININE 1.16 0.91  CALCIUM 8.9 8.9  MG 2.1 2.1   Liver Function Tests No results for input(s): "AST", "ALT", "ALKPHOS", "BILITOT", "PROT", "ALBUMIN" in the last 72 hours.  No results for input(s): "LIPASE", "AMYLASE" in the last 72 hours. Cardiac Enzymes No results for input(s): "CKTOTAL", "CKMB", "CKMBINDEX", "TROPONINI" in the last 72 hours.  BNP: BNP (last 3 results) Recent Labs    07/11/22 0209 07/26/22 1211 08/10/22 1142  BNP 1,588.2* 1,723.6* 1,905.0*    D-Dimer No results for input(s): "DDIMER" in the last 72 hours. Hemoglobin A1C No results for input(s): "HGBA1C" in the last 72 hours.  Fasting Lipid Panel No results for input(s): "CHOL", "HDL", "LDLCALC", "TRIG", "CHOLHDL", "LDLDIRECT" in the last 72 hours.  Thyroid Function Tests No results for input(s): "TSH", "T4TOTAL", "T3FREE", "THYROIDAB" in the last 72 hours.  Invalid input(s): "FREET3"  Other results:   Imaging    No results found.   Medications:     Scheduled Medications:  atorvastatin  80 mg Oral Daily   chlorhexidine  15 mL Mouth/Throat BID   Chlorhexidine Gluconate Cloth  6 each Topical Daily   dorzolamide-timolol  1 drop Both Eyes BID   feeding supplement  237 mL Oral BID BM   folic acid  1 mg Oral Daily   furosemide  20 mg Oral Daily   latanoprost  1 drop Both Eyes QHS   [START ON 08/19/2022] methotrexate  20 mg Oral Q Sat   mexiletine  250 mg Oral BID   mometasone-formoterol  2 puff Inhalation BID   multivitamin with minerals  1 tablet Oral  Daily   pantoprazole  40 mg Oral QAC breakfast   sodium chloride flush  10-40 mL Intracatheter Q12H   sodium chloride flush  10-40 mL Intracatheter Q12H   umeclidinium bromide  1 puff Inhalation Daily    Infusions:  heparin 1,050 Units/hr (08/18/22 1113)   milrinone 0.375 mcg/kg/min (08/18/22 0522)    PRN Medications: acetaminophen, albuterol, ondansetron (ZOFRAN) IV, mouth rinse, sodium chloride flush, sodium chloride flush    Patient Profile   Mr Siedschlag is a 57 y.o. 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   Assessment/Plan  1.  Acute on chronic Systolic HF-->Cardiogenic Shock  - Diagnosed 11/2019. Presented with VT. LHC 70% LAD  -- cMRI 12/21 concerning for sarcoid and EF 18%.  - PET 2/22 at [facility] EF 25% + active sarcoid - Echo 08/26/20 EF < 20% severely dilated LV RV mildly decreased.  - CPX 8/22. Unable to complete PFTs due to hypoventilation and poor effort. pVO2 16.1 (49% predicted). Slope 19 RER: 1.17 PETCO2 59 - Medtronic CRT-D upgrade in 06/08/21 - Echo 11/07/21: EF 20-25% RV mild to moderately reduced.  - Echo 07/10/22: EF <20%, RV okay, mod pericardial effusion, mod MR/TR - Admitted 07/25 with cardiogenic shock. - RHC: Nonobstructive CAD, severely elevated filling pressures and low Fick CO/CI (2.7/1.4) - CO-OX 61% on milrinone 0.375.  - volume status ok, Lasix 20 mg daily  - no BP room for GDMT - off digoxin w/ elevated level  - he is inotrope dependent, failed milrinone weak  - VAD workup underway. Discussed in [facility] 07/29. Likely not stable enough to discharge home before VAD. Will need dental extraction prior to surgery.  [name] contacted. Anticipate next wk    2.  Hx of stroke -Admitted 06/24 w/ R MCA stroke. S/p TPA and mechanical clot extraction. No residual deficits  -Likely cardioembolic in setting of severe LV dysfunction. 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   - Off eliquis  - Continue  heparin drip until all procedures completed.   3. 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 Continue mexilitene  - now s/p ICD. No VT on interrogation.    4. CAD - LHC 12/07/19 70-% LAD, no intervention - LHC mild non obstructive CAD.  - Continue statin and aspirin.    5. Cardiac sarcoid - PET 2/22 at [facility] EF 25% + active sarcoid - Has completed prednisone.  - Takes MTX 20 weekly, Saturday. Continue folic acid - Previously discussed EM biopsy but patient is reluctant  - Needs repeat PET, wasn't able to schedule 10/23 as insurance would not cover. Now has medicaid and medicare. Plan to reschedule once discharged  6. 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 AKI - suspect cardiorenal, improved w/ inotropic support - Creatinine back to baseline ~1 - follow BMP    8. Iron deficiency anemia - recent T sat 15%, scheduled for OP feraheme. Will complete inpatient    9. Pulmonary  - PFTs with severe obstructive defect, response to bronchodilator -FEV1 1.04L, FEV1/FVC 48% -Consulted pulmonary to assist with pre VAD optimization. Appreciate assistance  Length of Stay: [address], PA-C  08/18/2022, 11:23 AM  Advanced Heart Failure Team Pager [phone] (M-F; 7a - 5p)  Please contact [facility] for night-coverage after hours (5p -7a ) and weekends on [email or web address]

## 2022-08-18 NOTE — Progress Notes (Signed)
Daily Progress Note   Patient Name: Charles Holmes       Date: 08/18/2022 DOB: [birth year]  Age: 57 y.o. MRN#: [id number] Attending Physician: [name], MD Primary Care Physician: [name], PA-C Admit Date: 08/10/2022  Reason for Consultation/Follow-up: Establishing goals of care in setting of evaluation for LVAD.  Length of Stay: 8  Current Medications: Scheduled Meds:   atorvastatin  80 mg Oral Daily   chlorhexidine  15 mL Mouth/Throat BID   Chlorhexidine Gluconate Cloth  6 each Topical Daily   dorzolamide-timolol  1 drop Both Eyes BID   feeding supplement  237 mL Oral BID BM   folic acid  1 mg Oral Daily   furosemide  20 mg Oral Daily   latanoprost  1 drop Both Eyes QHS   [START ON 08/19/2022] methotrexate  20 mg Oral Q Sat   mexiletine  250 mg Oral BID   mometasone-formoterol  2 puff Inhalation BID   multivitamin with minerals  1 tablet Oral Daily   pantoprazole  40 mg Oral QAC breakfast   sodium chloride flush  10-40 mL Intracatheter Q12H   sodium chloride flush  10-40 mL Intracatheter Q12H   umeclidinium bromide  1 puff Inhalation Daily    Continuous Infusions:  heparin 1,050 Units/hr (08/18/22 1113)   milrinone 0.375 mcg/kg/min (08/18/22 0522)    PRN Meds: acetaminophen, albuterol, ondansetron (ZOFRAN) IV, mouth rinse, sodium chloride flush, sodium chloride flush  Physical Exam Vitals reviewed.  HENT:     Head: Normocephalic and atraumatic.  Pulmonary:     Effort: Pulmonary effort is normal.  Musculoskeletal:        General: Normal range of motion.  Skin:    General: Skin is warm and dry.  Neurological:     Mental Status: He is alert and oriented to person, place, and time.  Psychiatric:        Mood and Affect: Mood normal.        Behavior:  Behavior normal.        Thought Content: Thought content normal.        Judgment: Judgment normal.             Vital Signs: BP 99/66   Pulse 93   Temp 98.3 F (36.8 C) (Oral)   Resp 18   Ht 5'  7" (1.702 m)   Wt 76.1 kg   SpO2 95%   BMI 26.27 kg/m  SpO2: SpO2: 95 % O2 Device: O2 Device: Room Air O2 Flow Rate: O2 Flow Rate (L/min): 2 L/min  Intake/output summary:  Intake/Output Summary (Last 24 hours) at 08/18/2022 1450 Last data filed at 08/18/2022 1300 Gross per 24 hour  Intake 1574.59 ml  Output 2100 ml  Net -525.41 ml   LBM: Last BM Date : 08/16/22 Baseline Weight: Weight: 77.1 kg Most recent weight: Weight: 76.1 kg     Palliative Care Assessment & Plan   Patient Profile: 57 y.o. 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   He is now out of the ICU with milrinone support. He is being evaluated for LVAD. He is awaiting dental extractions.  Extensive chart review has been completed prior to seeing the patient including labs, vital signs, imaging, progress/consult notes, orders, medications and available advance directive documents.   Today's Discussion: Patient states he had a good week. He feels as though he has a little more fluid on him today and was given oral lasix earlier. He has been able to walk more since he moved out of the ICU.   The patient seems more confident in wanting to proceed with the LVAD if eligible. He found talking to the LVAD coordinator and a patient with an LVAD very helpful. He feels he has a better idea of what the LVAD will provide as far as quality of life. He states that he and his daughter have continued their discussions around his healthcare wishes. He does not  want to complete a HCPOA or advanced directives at this time.  Discussed the importance of continued conversation with family and the medical providers regarding overall plan of care and treatment options, ensuring decisions are within the context of the patient's values and GOCs. Encouraged the patient to call with questions or concerns.  Recommendations/Plan: Full code Full scope Continued PMT support  Code Status:    Code Status Orders  (From admission, onward)           Start     Ordered   08/10/22 1822  Full code  Continuous       Question:  By:  Answer:  Consent: discussion documented in EHR   08/10/22 1822           Time spent: 35 minutes  Thank you for allowing the Palliative Medicine Team to assist in the care of this patient.   Sherryll Burger, NP  Please contact Palliative Medicine Team phone at [phone] for questions and concerns.

## 2022-08-18 NOTE — Progress Notes (Signed)
Mobility Specialist Progress Note:   08/18/22 1321  Therapy Vitals  Temp 98.3 F (36.8 C)  Temp Source Oral  Pulse Rate 93  Resp 18  BP 99/66  Oxygen Therapy  SpO2 95 %  O2 Device Room Air  Mobility  Activity Ambulated with assistance in hallway  Level of Assistance Modified independent, requires aide device or extra time  Assistive Device Other (Comment) (IV Pole)  Distance Ambulated (ft) 470 ft  RUE Weight Bearing NWB  Activity Response Tolerated well  Mobility Referral Yes  $Mobility charge 1 Mobility  Mobility Specialist Start Time (ACUTE ONLY) 1316  Mobility Specialist Stop Time (ACUTE ONLY) 1325  Mobility Specialist Time Calculation (min) (ACUTE ONLY) 9 min    Pre Mobility: 92 HR During Mobility: 106 HR Post Mobility:  100 HR  Pt received in bed, agreeable to mobility. C/o of SOB d/t fluid build up. Asymptomatic throughout session. Pt left ambulating in room with call bell and all needs met.  Charles Holmes Mobility Specialist Please contact via Special educational needs teacher or Rehab office at [phone]

## 2022-08-19 DIAGNOSIS — I5023 Acute on chronic systolic (congestive) heart failure: Secondary | ICD-10-CM | POA: Diagnosis not present

## 2022-08-19 NOTE — Progress Notes (Signed)
Pt seen and examined, remains stable on IV milrinone, off diuretics  [name], MD

## 2022-08-19 NOTE — Progress Notes (Signed)
PT Cancellation Note  Patient Details Name: Charles Holmes MRN: [id number] DOB: 04/02/1965   Cancelled Treatment:    Reason Eval/Treat Not Completed: Other (comment). PT order to complete 6 minute walk test prior to LVAD placement received. Aware pt needs to have dental extraction done first so will complete 6 min walk test on Monday 8/5 as the LVAD implant date isn't set as of yet. If 6 minute walk test is needed prior to 8/5 send a secure chat to [facility] PT group. Thank you.  [name], PT, DPT Acute Rehabilitation Services Secure chat preferred Office #: [phone]    [name] 08/19/2022, 1:30 PM

## 2022-08-19 NOTE — Progress Notes (Signed)
Advanced Heart Failure Rounding Note  PCP-Cardiologist: Norman Herrlich, MD   Subjective:    07/25: Admit with cardiogenic shock. Started milrinone and NE.  - Doing well today; in good spirits. Walking around the hall.   Objective:   Weight Range: 76.4 kg Body mass index is 26.39 kg/m.   Vital Signs:   Temp:  [97.7 F (36.5 C)-98.3 F (36.8 C)] 97.8 F (36.6 C) (08/03 0948) Pulse Rate:  [85-94] 87 (08/03 0948) Resp:  [16-19] 16 (08/03 0907) BP: (97-103)/(63-71) 101/67 (08/03 0948) SpO2:  [93 %-96 %] 93 % (08/03 0948) Weight:  [76.4 kg] 76.4 kg (08/03 0500) Last BM Date : 08/18/22  Weight change: Filed Weights   08/17/22 0557 08/18/22 0623 08/19/22 0500  Weight: 75.2 kg 76.1 kg 76.4 kg    Intake/Output:   Intake/Output Summary (Last 24 hours) at 08/19/2022 1151 Last data filed at 08/19/2022 0600 Gross per 24 hour  Intake 929.95 ml  Output 2250 ml  Net -1320.05 ml      Physical Exam   General:  Well appearing. No respiratory difficulty HEENT: normal Neck: supple. JVD 5cm, lymphadenopathy or thyromegaly appreciated. Cor: PMI nondisplaced. Regular rate & rhythm. No rubs, gallops or murmurs. Lungs: clear Abdomen: soft, nontender, nondistended. No hepatosplenomegaly. No bruits or masses. Good bowel sounds. Extremities: no cyanosis, clubbing, rash, edema + RUE PICC w/ ecchymosis surrounding picc site  Neuro: alert & oriented x 3, cranial nerves grossly intact. moves all 4 extremities w/o difficulty. Affect pleasant.    Telemetry   V paced 80s-90s  EKG    N/A   Labs    CBC Recent Labs    08/18/22 0424 08/19/22 0113  WBC 6.5 6.9  HGB 10.2* 10.3*  HCT 31.9* 31.9*  MCV 107.8* 107.4*  PLT 165 181   Basic Metabolic Panel Recent Labs    62/95/28 0424 08/19/22 0113  NA 133* 135  K 4.6 4.7  CL 98 100  CO2 28 28  GLUCOSE 96 98  BUN 22* 18  CREATININE 0.91 0.87  CALCIUM 8.9 8.9  MG 2.1 2.0    BNP: BNP (last 3 results) Recent Labs     07/11/22 0209 07/26/22 1211 08/10/22 1142  BNP 1,588.2* 1,723.6* 1,905.0*     Imaging    No results found.   Medications:     Scheduled Medications:  atorvastatin  80 mg Oral Daily   chlorhexidine  15 mL Mouth/Throat BID   Chlorhexidine Gluconate Cloth  6 each Topical Daily   dorzolamide-timolol  1 drop Both Eyes BID   feeding supplement  237 mL Oral BID BM   folic acid  1 mg Oral Daily   furosemide  20 mg Oral Daily   latanoprost  1 drop Both Eyes QHS   methotrexate  20 mg Oral Q Sat   mexiletine  250 mg Oral BID   mometasone-formoterol  2 puff Inhalation BID   multivitamin with minerals  1 tablet Oral Daily   pantoprazole  40 mg Oral QAC breakfast   sodium chloride flush  10-40 mL Intracatheter Q12H   sodium chloride flush  10-40 mL Intracatheter Q12H   umeclidinium bromide  1 puff Inhalation Daily    Infusions:  heparin 1,100 Units/hr (08/19/22 1120)   milrinone 0.375 mcg/kg/min (08/19/22 0600)    PRN Medications: acetaminophen, albuterol, ondansetron (ZOFRAN) IV, mouth rinse, sodium chloride flush, sodium chloride flush  R/LHC 07/25 Findings: Ao = 98/73 (79) LV = 101/39 RA = 19 RV = 76/21 PA =  80/42 (57) PCW = 47 (v = 55) Fick cardiac output/index = 2.7/1.4 PVR = 3.0 WU Ao sat = 96% PA sat = 41%, 39% PAPi = 2.0  Patient Profile   Mr Suastegui is a 57 y.o. 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   Assessment/Plan  1.  Acute on chronic Systolic HF-->Cardiogenic Shock  - Diagnosed 11/2019. Presented with VT. LHC 70% LAD  -- cMRI 12/21 concerning for sarcoid and EF 18%.  - PET 2/22 at [facility] EF 25% + active sarcoid - Echo 08/26/20 EF < 20% severely dilated LV RV mildly decreased.  - CPX 8/22. Unable to complete PFTs due to hypoventilation and poor effort. pVO2 16.1 (49% predicted). Slope 19 RER: 1.17 PETCO2 59 - Medtronic CRT-D upgrade in 06/08/21 - Echo 11/07/21: EF 20-25% RV mild to moderately reduced.   - Echo 07/10/22: EF <20%, RV okay, mod pericardial effusion, mod MR/TR - Admitted 07/25 with cardiogenic shock. - RHC: Nonobstructive CAD, severely elevated filling pressures and low Fick CO/CI (2.7/1.4) - VAD workup underway. Discussed in [facility] 07/29. Likely not stable enough to discharge home before VAD. Will need dental extraction prior to surgery.  [name] contacted. Anticipate next wk  - Doing well this morning; euvolemic on exam. Mixed venous 67. sCr stable. Continue supportive care.   2.  Hx of stroke -Admitted 06/24 w/ R MCA stroke. S/p TPA and mechanical clot extraction. No residual deficits  -Likely cardioembolic in setting of severe LV dysfunction. 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   - Off eliquis  - Continue heparin drip until all procedures completed.   3. 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 Continue mexilitene  - now s/p ICD. No VT on interrogation.    4. CAD - LHC 12/07/19 70-% LAD, no intervention - LHC mild non obstructive CAD.  - Continue statin and aspirin.    5. Cardiac sarcoid - PET 2/22 at [facility] EF 25% + active sarcoid - Has completed prednisone.  - Takes MTX 20 weekly, Saturday. Continue folic acid - Previously discussed EM biopsy but patient is reluctant  - Needs repeat PET, wasn't able to schedule 10/23 as insurance would not cover. Now has medicaid and medicare. Plan to reschedule once discharged   6. 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 AKI - suspect cardiorenal, improved w/ inotropic support - Creatinine back to baseline ~1 - follow BMP    8. Iron deficiency anemia - recent T sat 15%, scheduled for OP feraheme. Will complete inpatient    9. Pulmonary  - PFTs with severe obstructive defect, response to bronchodilator -FEV1 1.04L, FEV1/FVC 48% -Consulted pulmonary to assist with pre VAD optimization. Appreciate assistance  Length of Stay: 9   , DO  08/19/2022,  11:51 AM  Advanced Heart Failure Team Pager [phone] (M-F; 7a - 5p)  Please contact [facility] for night-coverage after hours (5p -7a ) and weekends on [email or web address]

## 2022-08-19 NOTE — Progress Notes (Signed)
ANTICOAGULATION CONSULT NOTE  Pharmacy Consult for Heparin (Eliquis on hold) Indication: atrial fibrillation  Allergies  Allergen Reactions   Amiodarone     Severe tremors   Percocet [Oxycodone-Acetaminophen] Itching    Patient Measurements: Height: 5\' 7"  (170.2 cm) Weight: 76.4 kg (168 lb 8 oz) IBW/kg (Calculated) : 66.1 Heparin Dosing Weight: total weight  Vital Signs: Temp: 97.8 F (36.6 C) (08/03 0948) Temp Source: Oral (08/03 0948) BP: 101/67 (08/03 0948) Pulse Rate: 87 (08/03 0948)  Labs: Recent Labs    08/17/22 0227 08/18/22 0424 08/19/22 0113  HGB 10.4* 10.2* 10.3*  HCT 33.1* 31.9* 31.9*  PLT 204 165 181  HEPARINUNFRC 0.35 0.28* 0.30  CREATININE 1.16 0.91 0.87    Estimated Creatinine Clearance: 85.5 mL/min (by C-G formula based on SCr of 0.87 mg/dL).   Medical History: Past Medical History:  Diagnosis Date   CAD (coronary artery disease)    CHF (congestive heart failure) (HCC)    GERD (gastroesophageal reflux disease)    Hyperlipidemia    Hypertension    Systolic heart failure (HCC) 2021   LVEF 18%, RVEF 38% on cardiac MRI 12/19/2019. possible cardiac sarcoidosis.   Wide-complex tachycardia 2021   wears LifeVest    Medications:  Scheduled:   atorvastatin  80 mg Oral Daily   chlorhexidine  15 mL Mouth/Throat BID   Chlorhexidine Gluconate Cloth  6 each Topical Daily   dorzolamide-timolol  1 drop Both Eyes BID   feeding supplement  237 mL Oral BID BM   folic acid  1 mg Oral Daily   furosemide  20 mg Oral Daily   latanoprost  1 drop Both Eyes QHS   methotrexate  20 mg Oral Q Sat   mexiletine  250 mg Oral BID   mometasone-formoterol  2 puff Inhalation BID   multivitamin with minerals  1 tablet Oral Daily   pantoprazole  40 mg Oral QAC breakfast   sodium chloride flush  10-40 mL Intracatheter Q12H   sodium chloride flush  10-40 mL Intracatheter Q12H   umeclidinium bromide  1 puff Inhalation Daily   Infusions:   heparin 1,050 Units/hr  (08/19/22 0600)   milrinone 0.375 mcg/kg/min (08/19/22 0600)   PRN: acetaminophen, albuterol, ondansetron (ZOFRAN) IV, mouth rinse, sodium chloride flush, sodium chloride flush  Assessment: 57 yo male on apixaban for afib, recent CVA felt to be cardioembolic. Presents for cath, work up for LVAD. Last dose of apixaban 7/25 at 07:30, Hgb 11.9, Plts WNL,   Pharmacy consulted to transition apixaban to heparin.  aPTT and heparin level correlating so swithed to heparin level only yesterday. Recent CVA will try to keep heparin level < 0.5  Heparin level came back at 0.3 today. We will increase slightly to 1100 units/hr.   Goal of Therapy:  Heparin level 0.3-0.5 Monitor platelets by anticoagulation protocol: Yes   Plan:  Increase heparin 1100 units/h Daily  heparin level, CBC Monitor s/s bleeding    Ulyses Southward, PharmD, BCIDP, AAHIVP, CPP Infectious Disease Pharmacist 08/19/2022 11:18 AM

## 2022-08-20 DIAGNOSIS — I5023 Acute on chronic systolic (congestive) heart failure: Secondary | ICD-10-CM | POA: Diagnosis not present

## 2022-08-20 DIAGNOSIS — Z515 Encounter for palliative care: Secondary | ICD-10-CM | POA: Diagnosis not present

## 2022-08-20 DIAGNOSIS — Z7189 Other specified counseling: Secondary | ICD-10-CM | POA: Diagnosis not present

## 2022-08-20 LAB — BASIC METABOLIC PANEL WITH GFR
Anion gap: 8 (ref 5–15)
BUN: 24 mg/dL — ABNORMAL HIGH (ref 6–20)
CO2: 25 mmol/L (ref 22–32)
Calcium: 8.7 mg/dL — ABNORMAL LOW (ref 8.9–10.3)
Chloride: 101 mmol/L (ref 98–111)
Creatinine, Ser: 0.89 mg/dL (ref 0.61–1.24)
GFR, Estimated: >60 mL/min (ref >60)
Glucose, Bld: 103 mg/dL — ABNORMAL HIGH (ref 70–99)
Potassium: 4.2 mmol/L (ref 3.5–5.1)
Sodium: 134 mmol/L — ABNORMAL LOW (ref 135–145)

## 2022-08-20 LAB — COOXEMETRY PANEL
Carboxyhemoglobin: 2.1 % — ABNORMAL HIGH (ref 0.5–1.5)
Methemoglobin: <0.7 % (ref 0.0–1.5)
O2 Saturation: 63.3 %
Total hemoglobin: 10.1 g/dL — ABNORMAL LOW (ref 12.0–16.0)

## 2022-08-20 LAB — CBC
HCT: 31.3 % — ABNORMAL LOW (ref 39.0–52.0)
Hemoglobin: 10 g/dL — ABNORMAL LOW (ref 13.0–17.0)
MCH: 34 pg (ref 26.0–34.0)
MCHC: 31.9 g/dL (ref 30.0–36.0)
MCV: 106.5 fL — ABNORMAL HIGH (ref 80.0–100.0)
Platelets: 183 10*3/uL (ref 150–400)
RBC: 2.94 MIL/uL — ABNORMAL LOW (ref 4.22–5.81)
RDW: 17.6 % — ABNORMAL HIGH (ref 11.5–15.5)
WBC: 7 10*3/uL (ref 4.0–10.5)
nRBC: 0 % (ref 0.0–0.2)

## 2022-08-20 LAB — HEPARIN LEVEL (UNFRACTIONATED): Heparin Unfractionated: 0.38 [IU]/mL (ref 0.30–0.70)

## 2022-08-20 LAB — MAGNESIUM: Magnesium: 1.9 mg/dL (ref 1.7–2.4)

## 2022-08-20 NOTE — Progress Notes (Signed)
Advanced Heart Failure Rounding Note  PCP-Cardiologist: Norman Herrlich, MD   Subjective:    07/25: Admit with cardiogenic shock. Started milrinone and NE.  - No complaints today - 1900cc urine output  Objective:   Weight Range: 77.4 kg Body mass index is 26.74 kg/m.   Vital Signs:   Temp:  [97.7 F (36.5 C)-98.4 F (36.9 C)] 98.4 F (36.9 C) (08/04 0848) Pulse Rate:  [91-101] 94 (08/04 0848) Resp:  [16-18] 18 (08/04 0848) BP: (97-106)/(66-75) 98/67 (08/04 0848) SpO2:  [77 %-97 %] 97 % (08/04 0848) Weight:  [77.4 kg] 77.4 kg (08/04 0319) Last BM Date : 08/19/22  Weight change: Filed Weights   08/18/22 0623 08/19/22 0500 08/20/22 0319  Weight: 76.1 kg 76.4 kg 77.4 kg    Intake/Output:   Intake/Output Summary (Last 24 hours) at 08/20/2022 1101 Last data filed at 08/20/2022 1000 Gross per 24 hour  Intake 927.79 ml  Output 2475 ml  Net -1547.21 ml      Physical Exam   General:  Well appearing. No respiratory difficulty HEENT: normal Neck: supple. JVD 6cm, lymphadenopathy or thyromegaly appreciated. Cor: PMI nondisplaced. Regular rate & rhythm. No rubs, gallops or murmurs. Lungs: clear Abdomen: soft, nontender, nondistended. No hepatosplenomegaly. No bruits or masses. Good bowel sounds. Extremities: no cyanosis, clubbing, rash, edema + RUE PICC w/ ecchymosis surrounding picc site  Neuro: alert & oriented x 3, cranial nerves grossly intact. moves all 4 extremities w/o difficulty. Affect pleasant.    Telemetry   V paced 80s-90s  EKG    N/A   Labs    CBC Recent Labs    08/19/22 0113 08/20/22 0228  WBC 6.9 7.0  HGB 10.3* 10.0*  HCT 31.9* 31.3*  MCV 107.4* 106.5*  PLT 181 183   Basic Metabolic Panel Recent Labs    16/10/96 0113 08/20/22 0228  NA 135 134*  K 4.7 4.2  CL 100 101  CO2 28 25  GLUCOSE 98 103*  BUN 18 24*  CREATININE 0.87 0.89  CALCIUM 8.9 8.7*  MG 2.0 1.9    BNP: BNP (last 3 results) Recent Labs    07/11/22 0209  07/26/22 1211 08/10/22 1142  BNP 1,588.2* 1,723.6* 1,905.0*     Imaging    No results found.   Medications:     Scheduled Medications:  atorvastatin  80 mg Oral Daily   chlorhexidine  15 mL Mouth/Throat BID   Chlorhexidine Gluconate Cloth  6 each Topical Daily   dorzolamide-timolol  1 drop Both Eyes BID   feeding supplement  237 mL Oral BID BM   folic acid  1 mg Oral Daily   furosemide  20 mg Oral Daily   latanoprost  1 drop Both Eyes QHS   methotrexate  20 mg Oral Q Sat   mexiletine  250 mg Oral BID   mometasone-formoterol  2 puff Inhalation BID   multivitamin with minerals  1 tablet Oral Daily   pantoprazole  40 mg Oral QAC breakfast   umeclidinium bromide  1 puff Inhalation Daily    Infusions:  heparin 1,100 Units/hr (08/20/22 0600)   milrinone 0.375 mcg/kg/min (08/20/22 0600)    PRN Medications: acetaminophen, albuterol, ondansetron (ZOFRAN) IV, mouth rinse, sodium chloride flush  R/LHC 07/25 Findings: Ao = 98/73 (79) LV = 101/39 RA = 19 RV = 76/21 PA = 80/42 (57) PCW = 47 (v = 55) Fick cardiac output/index = 2.7/1.4 PVR = 3.0 WU Ao sat = 96% PA sat = 41%, 39%  PAPi = 2.0  Patient Profile   [name] is a 57 y.o. 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   Assessment/Plan  1.  Acute on chronic Systolic HF-->Cardiogenic Shock  - Diagnosed 11/2019. Presented with VT. LHC 70% LAD  -- cMRI 12/21 concerning for sarcoid and EF 18%.  - PET 2/22 at [facility] EF 25% + active sarcoid - Echo 08/26/20 EF < 20% severely dilated LV RV mildly decreased.  - CPX 8/22. Unable to complete PFTs due to hypoventilation and poor effort. pVO2 16.1 (49% predicted). Slope 19 RER: 1.17 PETCO2 59 - Medtronic CRT-D upgrade in 06/08/21 - Echo 11/07/21: EF 20-25% RV mild to moderately reduced.  - Echo 07/10/22: EF <20%, RV okay, mod pericardial effusion, mod MR/TR - Admitted 07/25 with cardiogenic shock. - RHC: Nonobstructive CAD,  severely elevated filling pressures and low Fick CO/CI (2.7/1.4) - VAD workup underway. Discussed in [facility] 07/29. Likely not stable enough to discharge home before VAD. Will need dental extraction prior to surgery.  [name] contacted. Anticipate this week.  - Doing well this AM; mixed venous 63, sCr 0.9. Continue supportive care.    2.  Hx of stroke -Admitted 06/24 w/ R MCA stroke. S/p TPA and mechanical clot extraction. No residual deficits  -Likely cardioembolic in setting of severe LV dysfunction. 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   - Off eliquis  - Continue heparin drip until all procedures completed.   3. 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 Continue mexilitene  - now s/p ICD. No VT on interrogation.    4. CAD - LHC 12/07/19 70-% LAD, no intervention - LHC mild non obstructive CAD.  - Continue statin and aspirin.    5. Cardiac sarcoid - PET 2/22 at [facility] EF 25% + active sarcoid - Has completed prednisone.  - Takes MTX 20 weekly, Saturday. Continue folic acid - Previously discussed EM biopsy but patient is reluctant  - Needs repeat PET, wasn't able to schedule 10/23 as insurance would not cover. Now has medicaid and medicare. Plan to reschedule once discharged   6. 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 AKI - suspect cardiorenal, improved w/ inotropic support - Creatinine back to baseline ~1 - follow BMP    8. Iron deficiency anemia - recent T sat 15%, scheduled for OP feraheme. Will complete inpatient    9. Pulmonary  - PFTs with severe obstructive defect, response to bronchodilator -FEV1 1.04L, FEV1/FVC 48% -Consulted pulmonary to assist with pre VAD optimization. Appreciate assistance  Length of Stay: 10   , DO  08/20/2022, 11:01 AM  Advanced Heart Failure Team Pager [phone] (M-F; 7a - 5p)  Please contact [facility] for night-coverage after hours (5p -7a ) and weekends on  [email or web address]

## 2022-08-20 NOTE — Progress Notes (Signed)
Pt seen and examined, no changes from yesterday, resting comfortably on milrinone gtt  Charles Cove, MD

## 2022-08-20 NOTE — Progress Notes (Signed)
Daily Progress Note   Patient Name: Charles Holmes       Date: 08/20/2022 DOB: Jun 18, 1965  Age: 57 y.o. MRN#: [id number] Attending Physician: [name], MD Primary Care Physician: [name], PA-C Admit Date: 08/10/2022  Reason for Consultation/Follow-up: Establishing goals of care in setting of evaluation for LVAD.  Length of Stay: 10  Current Medications: Scheduled Meds:   atorvastatin  80 mg Oral Daily   chlorhexidine  15 mL Mouth/Throat BID   Chlorhexidine Gluconate Cloth  6 each Topical Daily   dorzolamide-timolol  1 drop Both Eyes BID   feeding supplement  237 mL Oral BID BM   folic acid  1 mg Oral Daily   furosemide  20 mg Oral Daily   latanoprost  1 drop Both Eyes QHS   methotrexate  20 mg Oral Q Sat   mexiletine  250 mg Oral BID   mometasone-formoterol  2 puff Inhalation BID   multivitamin with minerals  1 tablet Oral Daily   pantoprazole  40 mg Oral QAC breakfast   umeclidinium bromide  1 puff Inhalation Daily    Continuous Infusions:  heparin 1,100 Units/hr (08/20/22 0600)   milrinone 0.375 mcg/kg/min (08/20/22 0600)    PRN Meds: acetaminophen, albuterol, ondansetron (ZOFRAN) IV, mouth rinse, sodium chloride flush  Physical Exam Vitals reviewed.  HENT:     Head: Normocephalic and atraumatic.  Pulmonary:     Effort: Pulmonary effort is normal.  Skin:    General: Skin is warm and dry.  Neurological:     Mental Status: He is alert and oriented to person, place, and time.  Psychiatric:        Mood and Affect: Mood normal.        Behavior: Behavior normal.             Vital Signs: BP 98/67   Pulse 94   Temp 98.4 F (36.9 C) (Oral)   Resp 18   Ht 5\' 7"  (1.702 m)   Wt 77.4 kg   SpO2 97%   BMI 26.74 kg/m  SpO2: SpO2: 97 % O2 Device: O2  Device: Room Air O2 Flow Rate: O2 Flow Rate (L/min): 2 L/min  Intake/output summary:  Intake/Output Summary (Last 24 hours) at 08/20/2022 1037 Last data filed at 08/20/2022 0600 Gross per 24 hour  Intake 927.79  ml  Output 1675 ml  Net -747.21 ml   LBM: Last BM Date : 08/19/22 Baseline Weight: Weight: 77.1 kg Most recent weight: Weight: 77.4 kg   Palliative Care Assessment & Plan   Patient Profile: 57 y.o. 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   He is now out of the ICU with milrinone support. He is being evaluated for LVAD. He is awaiting dental extractions.   Assessment: Patient states he is pretty sleepy today. He feels as though he has had a little more fluid on him today and yesterday. He did not walk in the hallway yesterday, but plans to today if PT comes around.  His goals of care are clear and he is hopeful for eventual LVAD. He hopes for increased quality of life.  We discussed his HCPOA paperwork again. At this time he prefers we follow up in a few days as he continues to consider completing it. Education offered today regarding the importance of continued conversation with his family and the medical providers regarding overall plan of care and treatment options, ensuring decisions are within the context of the patients values and GOCs.   PMT will continue to support holistically.  Recommendations/Plan: Full code Full scope Check back in mid-week re: HCPOA documents  Code Status:    Code Status Orders  (From admission, onward)           Start     Ordered   08/10/22 1822  Full code  Continuous       Question:  By:  Answer:  Consent: discussion documented in EHR   08/10/22 1822          Care plan was discussed with Dr. Jomarie Longs  Extensive chart review has been completed prior to seeing the patient including labs, vital signs, imaging, progress/consult notes, orders, medications and available advance directive documents.   Time spent: 35 minutes  Thank you for allowing the Palliative Medicine Team to assist in the care of this patient.   Sherryll Burger, NP  Please contact Palliative Medicine Team phone at [phone] for questions and concerns.

## 2022-08-20 NOTE — Progress Notes (Signed)
ANTICOAGULATION CONSULT NOTE  Pharmacy Consult for Heparin (Eliquis on hold) Indication: atrial fibrillation  Allergies  Allergen Reactions   Amiodarone     Severe tremors   Percocet [Oxycodone-Acetaminophen] Itching    Patient Measurements: Height: 5\' 7"  (170.2 cm) Weight: 77.4 kg (170 lb 11.2 oz) IBW/kg (Calculated) : 66.1 Heparin Dosing Weight: total weight  Vital Signs: Temp: 98.4 F (36.9 C) (08/04 0848) Temp Source: Oral (08/04 0848) BP: 98/67 (08/04 0848) Pulse Rate: 94 (08/04 0848)  Labs: Recent Labs    08/18/22 0424 08/19/22 0113 08/20/22 0228  HGB 10.2* 10.3* 10.0*  HCT 31.9* 31.9* 31.3*  PLT 165 181 183  HEPARINUNFRC 0.28* 0.30 0.38  CREATININE 0.91 0.87 0.89    Estimated Creatinine Clearance: 83.6 mL/min (by C-G formula based on SCr of 0.89 mg/dL).   Medical History: Past Medical History:  Diagnosis Date   CAD (coronary artery disease)    CHF (congestive heart failure) (HCC)    GERD (gastroesophageal reflux disease)    Hyperlipidemia    Hypertension    Systolic heart failure (HCC) 2021   LVEF 18%, RVEF 38% on cardiac MRI 12/19/2019. possible cardiac sarcoidosis.   Wide-complex tachycardia 2021   wears LifeVest    Medications:  Scheduled:   atorvastatin  80 mg Oral Daily   chlorhexidine  15 mL Mouth/Throat BID   Chlorhexidine Gluconate Cloth  6 each Topical Daily   dorzolamide-timolol  1 drop Both Eyes BID   feeding supplement  237 mL Oral BID BM   folic acid  1 mg Oral Daily   furosemide  20 mg Oral Daily   latanoprost  1 drop Both Eyes QHS   methotrexate  20 mg Oral Q Sat   mexiletine  250 mg Oral BID   mometasone-formoterol  2 puff Inhalation BID   multivitamin with minerals  1 tablet Oral Daily   pantoprazole  40 mg Oral QAC breakfast   umeclidinium bromide  1 puff Inhalation Daily   Infusions:   heparin 1,100 Units/hr (08/20/22 0600)   milrinone 0.375 mcg/kg/min (08/20/22 0600)   PRN: acetaminophen, albuterol, ondansetron  (ZOFRAN) IV, mouth rinse, sodium chloride flush  Assessment: 57 yo male on apixaban for afib, recent CVA felt to be cardioembolic. Presents for cath, work up for LVAD. Last dose of apixaban 7/25 at 07:30, Hgb 11.9, Plts WNL,   Pharmacy consulted to transition apixaban to heparin.  aPTT and heparin level correlating so swithed to heparin level only yesterday. Recent CVA will try to keep heparin level < 0.5  Heparin level cont to be therapeutic. Hgb stable.  Goal of Therapy:  Heparin level 0.3-0.5 Monitor platelets by anticoagulation protocol: Yes   Plan:  Cont heparin 1100 units/h Daily heparin level, CBC Monitor s/s bleeding   Ulyses Southward, PharmD, BCIDP, AAHIVP, CPP Infectious Disease Pharmacist 08/20/2022 11:17 AM

## 2022-08-21 DIAGNOSIS — I5023 Acute on chronic systolic (congestive) heart failure: Secondary | ICD-10-CM | POA: Diagnosis not present

## 2022-08-21 LAB — GLUCOSE, CAPILLARY: Glucose-Capillary: 115 mg/dL — ABNORMAL HIGH (ref 70–99)

## 2022-08-21 MED ORDER — FUROSEMIDE 10 MG/ML IJ SOLN
80.0000 mg | Freq: Two times a day (BID) | INTRAMUSCULAR | Status: AC
  Administered 2022-08-21 – 2022-08-22 (×2): 80 mg via INTRAVENOUS
  Filled 2022-08-21 (×2): qty 8

## 2022-08-21 MED ORDER — MAGNESIUM SULFATE 2 GM/50ML IV SOLN
2.0000 g | Freq: Once | INTRAVENOUS | Status: AC
  Administered 2022-08-21: 2 g via INTRAVENOUS
  Filled 2022-08-21: qty 50

## 2022-08-21 MED ORDER — FUROSEMIDE 40 MG PO TABS
40.0000 mg | ORAL_TABLET | Freq: Every day | ORAL | Status: DC
  Administered 2022-08-21: 40 mg via ORAL
  Filled 2022-08-21: qty 1

## 2022-08-21 MED ORDER — CEFAZOLIN SODIUM-DEXTROSE 2-4 GM/100ML-% IV SOLN
2.0000 g | INTRAVENOUS | Status: DC
  Filled 2022-08-21: qty 100

## 2022-08-21 NOTE — Progress Notes (Signed)
Mobility Specialist Progress Note:   08/21/22 1447  Mobility  Activity Refused mobility   Pt refused mobility d/t waiting on dentist. Will f/u as able.    Leory Plowman  Mobility Specialist Please contact via [employer] office at [phone]

## 2022-08-21 NOTE — Progress Notes (Signed)
ANTICOAGULATION CONSULT NOTE-Follow Up  Pharmacy Consult for Heparin (Eliquis on hold) Indication: atrial fibrillation  Allergies  Allergen Reactions   Amiodarone     Severe tremors   Percocet [Oxycodone-Acetaminophen] Itching    Patient Measurements: Height: 5\' 7"  (170.2 cm) Weight: 77.4 kg (170 lb 11.2 oz) IBW/kg (Calculated) : 66.1 Heparin Dosing Weight: total weight  Vital Signs: Temp: 98.2 F (36.8 C) (08/05 0300) Temp Source: Oral (08/05 0300) BP: 103/69 (08/05 0300) Pulse Rate: 99 (08/05 0300)  Labs: Recent Labs    08/19/22 0113 08/20/22 0228 08/21/22 0327  HGB 10.3* 10.0*  --   HCT 31.9* 31.3*  --   PLT 181 183  --   HEPARINUNFRC 0.30 0.38 0.28*  CREATININE 0.87 0.89 0.88    Estimated Creatinine Clearance: 84.5 mL/min (by C-G formula based on SCr of 0.88 mg/dL).   Medical History: Past Medical History:  Diagnosis Date   CAD (coronary artery disease)    CHF (congestive heart failure) (HCC)    GERD (gastroesophageal reflux disease)    Hyperlipidemia    Hypertension    Systolic heart failure (HCC) 2021   LVEF 18%, RVEF 38% on cardiac MRI 12/19/2019. possible cardiac sarcoidosis.   Wide-complex tachycardia 2021   wears LifeVest    Medications:  Scheduled:   atorvastatin  80 mg Oral Daily   chlorhexidine  15 mL Mouth/Throat BID   Chlorhexidine Gluconate Cloth  6 each Topical Daily   dorzolamide-timolol  1 drop Both Eyes BID   feeding supplement  237 mL Oral BID BM   folic acid  1 mg Oral Daily   furosemide  20 mg Oral Daily   latanoprost  1 drop Both Eyes QHS   methotrexate  20 mg Oral Q Sat   mexiletine  250 mg Oral BID   mometasone-formoterol  2 puff Inhalation BID   multivitamin with minerals  1 tablet Oral Daily   pantoprazole  40 mg Oral QAC breakfast   umeclidinium bromide  1 puff Inhalation Daily   Infusions:   heparin 1,100 Units/hr (08/21/22 0300)   milrinone 0.375 mcg/kg/min (08/21/22 0402)   PRN: acetaminophen, albuterol,  ondansetron (ZOFRAN) IV, mouth rinse, sodium chloride flush  Assessment: 57 yo male on apixaban for afib, recent CVA felt to be cardioembolic. Presents for cath, work up for LVAD. Last dose of apixaban 7/25 at 07:30, Hgb 11.9, Plts WNL,   Pharmacy consulted to transition apixaban to heparin.  aPTT and heparin level correlating so swithed to heparin level only yesterday. Recent CVA will try to keep heparin level < 0.5  Heparin level subtherapeutic at 0.28 on 1100 units/hr. Hgb stable. Will slightly increase to keep within range. Per RN, no issues with heparin drip running continuously or s/sx of bleeding  Goal of Therapy:  Heparin level 0.3-0.5 Monitor platelets by anticoagulation protocol: Yes   Plan:  Inc heparin 1150 units/h 8h heparin level Daily heparin level, CBC Monitor s/s bleeding   Arabella Merles, PharmD. Clinical Pharmacist 08/21/2022 4:27 AM

## 2022-08-21 NOTE — Progress Notes (Addendum)
Advanced Heart Failure Rounding Note  PCP-Cardiologist: Norman Herrlich, MD   Subjective:    07/25: Admit with cardiogenic shock. Started milrinone and NE.  Got dizzy, diaphoretic and SOB when getting up to the restroom this morning. EKG a sensed v paced 90s. HR on tele was up to 120s. Resolved with rest.   Resting in bed, denies SOB. States he only felt like he felt this morning when he had fluid on him.   Objective:   Weight Range: 77.5 kg Body mass index is 26.75 kg/m.   Vital Signs:   Temp:  [97.8 F (36.6 C)-98.4 F (36.9 C)] 98.1 F (36.7 C) (08/05 0721) Pulse Rate:  [87-106] 97 (08/05 0721) Resp:  [17-18] 18 (08/05 0615) BP: (93-119)/(62-86) 101/75 (08/05 0721) SpO2:  [93 %-98 %] 98 % (08/05 0745) Weight:  [77.5 kg] 77.5 kg (08/05 0504) Last BM Date : 08/20/22  Weight change: Filed Weights   08/19/22 0500 08/20/22 0319 08/21/22 0504  Weight: 76.4 kg 77.4 kg 77.5 kg    Intake/Output:   Intake/Output Summary (Last 24 hours) at 08/21/2022 0802 Last data filed at 08/21/2022 0724 Gross per 24 hour  Intake 716.3 ml  Output 2675 ml  Net -1958.7 ml   CVP 6 Physical Exam  General:  elderly appearing.  No respiratory difficulty HEENT: normal Neck: supple. JVD ~8 cm. Carotids 2+ bilat; no bruits. No lymphadenopathy or thyromegaly appreciated. Cor: PMI nondisplaced. Regular rate & rhythm. No rubs, gallops or murmurs. Lungs: clear Abdomen: soft, nontender, nondistended. No hepatosplenomegaly. No bruits or masses. Good bowel sounds. Extremities: no cyanosis, clubbing, rash, +1 BLE edema. PICC RUE Neuro: alert & oriented x 3, cranial nerves grossly intact. moves all 4 extremities w/o difficulty. Affect pleasant.   Telemetry   V paced 80s-90s  EKG    A sensed V paced 90s  Labs    CBC Recent Labs    08/19/22 0113 08/20/22 0228  WBC 6.9 7.0  HGB 10.3* 10.0*  HCT 31.9* 31.3*  MCV 107.4* 106.5*  PLT 181 183   Basic Metabolic Panel Recent Labs     08/20/22 0228 08/21/22 0327  NA 134* 133*  K 4.2 5.0  CL 101 99  CO2 25 25  GLUCOSE 103* 90  BUN 24* 21*  CREATININE 0.89 0.88  CALCIUM 8.7* 8.9  MG 1.9 1.9    BNP: BNP (last 3 results) Recent Labs    07/11/22 0209 07/26/22 1211 08/10/22 1142  BNP 1,588.2* 1,723.6* 1,905.0*     Imaging    No results found.   Medications:     Scheduled Medications:  atorvastatin  80 mg Oral Daily   chlorhexidine  15 mL Mouth/Throat BID   Chlorhexidine Gluconate Cloth  6 each Topical Daily   dorzolamide-timolol  1 drop Both Eyes BID   feeding supplement  237 mL Oral BID BM   folic acid  1 mg Oral Daily   furosemide  20 mg Oral Daily   latanoprost  1 drop Both Eyes QHS   methotrexate  20 mg Oral Q Sat   mexiletine  250 mg Oral BID   mometasone-formoterol  2 puff Inhalation BID   multivitamin with minerals  1 tablet Oral Daily   pantoprazole  40 mg Oral QAC breakfast   umeclidinium bromide  1 puff Inhalation Daily    Infusions:  heparin 1,150 Units/hr (08/21/22 0615)   magnesium sulfate bolus IVPB     milrinone 0.375 mcg/kg/min (08/21/22 0615)    PRN Medications:  acetaminophen, albuterol, ondansetron (ZOFRAN) IV, mouth rinse, sodium chloride flush  R/LHC 07/25 Findings: Ao = 98/73 (79) LV = 101/39 RA = 19 RV = 76/21 PA = 80/42 (57) PCW = 47 (v = 55) Fick cardiac output/index = 2.7/1.4 PVR = 3.0 WU Ao sat = 96% PA sat = 41%, 39% PAPi = 2.0  Patient Profile  Charles Holmes is a 57 y.o. 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  Assessment/Plan  1.  Acute on chronic Systolic HF-->Cardiogenic Shock  - Diagnosed 11/2019. Presented with VT. LHC 70% LAD  - cMRI 12/21 concerning for sarcoid and EF 18%.  - PET 2/22 at [facility] EF 25% + active sarcoid - Echo 08/26/20 EF < 20% severely dilated LV RV mildly decreased.  - CPX 8/22. Unable to complete PFTs due to hypoventilation and poor effort. pVO2 16.1 (49% predicted). Slope 19  RER: 1.17 PETCO2 59 - Medtronic CRT-D upgrade in 06/08/21 - Echo 11/07/21: EF 20-25% RV mild to moderately reduced.  - Echo 07/10/22: EF <20%, RV okay, mod pericardial effusion, mod Charles/TR - Admitted 07/25 with cardiogenic shock. - RHC: Nonobstructive CAD, severely elevated filling pressures and low Fick CO/CI (2.7/1.4) - VAD workup underway. Discussed in [facility] 07/29. Likely not stable enough to discharge home before VAD. Will need dental extraction prior to surgery.  [name] contacted, apparently no openings this week, will reattempt to contact today.  -Increase daily lasix from 20>40 mg daily. CVP 6 but JVD appears slightly higher -Remains on milrinone 0.375. Mixed venous 66, sCr 0.9. Continue supportive care.    2.  Hx of stroke -Admitted 06/24 w/ R MCA stroke. S/p TPA and mechanical clot extraction. No residual deficits  -Likely cardioembolic in setting of severe LV dysfunction. 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   - Off eliquis  - Continue heparin drip until all procedures completed.   3. 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 Continue mexilitene  - now s/p ICD. No VT on interrogation.    4. CAD - LHC 12/07/19 70-% LAD, no intervention - LHC mild non obstructive CAD.  - Continue statin and aspirin.    5. Cardiac sarcoid - PET 2/22 at [facility] EF 25% + active sarcoid - Has completed prednisone.  - Takes MTX 20 weekly, Saturday. Continue folic acid - Previously discussed EM biopsy but patient is reluctant  - Needs repeat PET, wasn't able to schedule 10/23 as insurance would not cover. Now has medicaid and medicare. Plan to reschedule once discharged   6. 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 AKI - suspect cardiorenal, improved w/ inotropic support - Creatinine back to baseline ~1 - follow BMP    8. Iron deficiency anemia - recent T sat 15%, scheduled for OP feraheme. Will complete inpatient    9. Pulmonary  -  PFTs with severe obstructive defect, response to bronchodilator - FEV1 1.04L, FEV1/FVC 48% - Consulted pulmonary to assist with pre VAD optimization. Appreciate assistance  Length of Stay: [id number], NP  08/21/2022, 8:02 AM  Advanced Heart Failure Team Pager [phone] (M-F; 7a - 5p)  Please contact [facility] for night-coverage after hours (5p -7a ) and weekends on [email or web address]  Patient seen and examined with the above-signed Advanced Practice Provider and/or Housestaff. I personally reviewed laboratory data, imaging studies and relevant notes. I independently examined the patient and formulated  the important aspects of the plan. I have edited the note to reflect any of my changes or salient points. I have personally discussed the plan with the patient and/or family.  He had orthopnea this am. Now improved. CVP 12-13. Co-ox stable on milrinone   General:  Sitting up in bed No resp difficulty HEENT: normal Neck: supple. JVP to jaw Carotids 2+ bilat; no bruits. No lymphadenopathy or thryomegaly appreciated. Cor Regular rate & rhythm. No rubs, gallops or murmurs. Lungs: clear Abdomen: soft, nontender, nondistended. No hepatosplenomegaly. No bruits or masses. Good bowel sounds. Extremities: no cyanosis, clubbing, rash, 1+ edema Neuro: alert & orientedx3, cranial nerves grossly intact. moves all 4 extremities w/o difficulty. Affect pleasant  He is volume overloaded. Resume IV lasix. Co-ox ok on IV milrinone.   Will need VAD this admission.   Have spoken to [name] who will see him later today for possible teeth extraction for tomorrow. (Very appreciative for the help here)  Arvilla Meres, MD  3:39 PM

## 2022-08-21 NOTE — Progress Notes (Signed)
ANTICOAGULATION CONSULT NOTE-Follow Up  Pharmacy Consult for Heparin (Eliquis on hold) Indication: atrial fibrillation  Allergies  Allergen Reactions   Amiodarone     Severe tremors   Percocet [Oxycodone-Acetaminophen] Itching    Patient Measurements: Height: 5\' 7"  (170.2 cm) Weight: 77.5 kg (170 lb 12.8 oz) IBW/kg (Calculated) : 66.1 Heparin Dosing Weight: total weight  Vital Signs: Temp: 98.6 F (37 C) (08/05 1202) Temp Source: Oral (08/05 1202) BP: 109/75 (08/05 1202) Pulse Rate: 199 (08/05 1202)  Labs: Recent Labs    08/19/22 0113 08/20/22 0228 08/21/22 0327  HGB 10.3* 10.0*  --   HCT 31.9* 31.3*  --   PLT 181 183  --   HEPARINUNFRC 0.30 0.38 0.28*  CREATININE 0.87 0.89 0.88    Estimated Creatinine Clearance: 84.5 mL/min (by C-G formula based on SCr of 0.88 mg/dL).   Medical History: Past Medical History:  Diagnosis Date   CAD (coronary artery disease)    CHF (congestive heart failure) (HCC)    GERD (gastroesophageal reflux disease)    Hyperlipidemia    Hypertension    Systolic heart failure (HCC) 2021   LVEF 18%, RVEF 38% on cardiac MRI 12/19/2019. possible cardiac sarcoidosis.   Wide-complex tachycardia 2021   wears LifeVest    Medications:  Scheduled:   atorvastatin  80 mg Oral Daily   chlorhexidine  15 mL Mouth/Throat BID   Chlorhexidine Gluconate Cloth  6 each Topical Daily   dorzolamide-timolol  1 drop Both Eyes BID   feeding supplement  237 mL Oral BID BM   folic acid  1 mg Oral Daily   furosemide  80 mg Intravenous BID   latanoprost  1 drop Both Eyes QHS   methotrexate  20 mg Oral Q Sat   mexiletine  250 mg Oral BID   mometasone-formoterol  2 puff Inhalation BID   multivitamin with minerals  1 tablet Oral Daily   pantoprazole  40 mg Oral QAC breakfast   umeclidinium bromide  1 puff Inhalation Daily   Infusions:   heparin 1,150 Units/hr (08/21/22 1525)   milrinone 0.375 mcg/kg/min (08/21/22 1523)   PRN: acetaminophen, albuterol,  ondansetron (ZOFRAN) IV, mouth rinse, sodium chloride flush  Assessment: 57 yo male on apixaban for afib, recent CVA felt to be cardioembolic. Presents for cath, work up for LVAD. Last dose of apixaban 7/25 at 07:30, Hgb 11.9, Plts WNL,   Pharmacy consulted to transition apixaban to heparin.  aPTT and heparin level correlating so swithed to heparin level only yesterday. Recent CVA will try to keep heparin level < 0.5  Heparin level slightly subtherapeutic this a.m. and rate increased.  Now order to d/c heparin pre-op for dental surgery 8/6 am. Dental surgery is scheduled for 1245. Okayed by HF team to d/c heparin at 0500.   Goal of Therapy:  Heparin level 0.3-0.5 units/ml Monitor platelets by anticoagulation protocol: Yes   Plan:  Continue heparin 1150 units/hr - will add d/c time of 8/6 0500. Will get a.m. heparin level ~0400 to help with dosing post-OR.  Christoper Fabian, PharmD, BCPS Please see amion for complete clinical pharmacist phone list 08/21/2022 4:38 PM

## 2022-08-21 NOTE — Progress Notes (Signed)
CSW met with patient at bedside. Patient states he is getting his teeth out tomorrow and ready for LVAD surgery as he "is tired of sitting around". CSW provided supportive intervention and will continue to follow for supportive needs. Lasandra Beech, LCSW, CCSW-MCS [phone]

## 2022-08-21 NOTE — Progress Notes (Signed)
PROGRESS NOTE    Charles Holmes  ZOX:[id number] DOB: 30-Apr-1965 DOA: 08/10/2022 PCP: [name], PA-C  Charles Holmes is a 57 y.o. 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 tach, cardiac sarcoidosis, paroxysmal A-fib presented to the ED with worsening dyspnea, fatigue X 12 days, in the ER he was volume overloaded, tachypneic with 3+ edema, chest x-ray noted cardiomegaly pulmonary vascular congestion bilateral infiltrates -Admitted, treated with diuretics -RHC 7/25 noted low cardiac output, high filling pressures, concern for cardiogenic shock, started on milrinone and nor epi -7/26 CT surgery consulted for possible LVAD -7/27 Lasix drip discontinued -Medically stable, awaiting LVAD, needs dental extraction prior   Subjective: -Had some dizziness and dyspnea when he walked to the bathroom this morning, now feels okay  Assessment and Plan:  Acute on chronic systolic CHF, cardiogenic shock [name] Moderate pericardial effusion -Echo Ef <20%, severe dilatation of LV cavity, akinetic, hypokinetic entire anterior wall, entire lateral wall, anterior septum, entire inferior wall, and apex. RV systolic function preserved, moderate pericardial effusion, moderate mitral valve regurgitation, moderate TR.  -Cardiac MRI in 12/21 was concerning for sarcoidosis -RHC this admit with nonobstructive CAD, severely elevated filling pressures and low Fick -Currently on milrinone, low-dose Lasix -Heart failure team and T CTS following, VAD workup underway -Needs dental extraction prior to surgery, reached out to [name] Friday afternoon, he did not have openings this week, await heart failure team input  CAD (coronary artery disease) Coronary angiography with proximal Cx 20 % stenosis, mid LAD 50% stenosis. Continue ASA/statin  HTN (hypertension) Continue milrinone.   Ventricular tachycardia (HCC) Patient on  mexiletine.  Keep K at 4 and Mg at 2.  Replete mag today  Cardiac sarcoidosis Continue with methotrexate.   AKI (acute kidney injury) (HCC) Hypokalemia.  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  Anticoagulation with heparin IV for now.   Iron deficiency anemia Continue iron supplementation.  -plan for IV fe after workup complete  Severe COPD -stable, continue Dulera, as needed albuterol  DVT prophylaxis: Hep gtt Code Status: Full Code Family Communication: none present Disposition Plan:   Consultants:    Procedures:   Antimicrobials:    Objective: Vitals:   08/21/22 0615 08/21/22 0721 08/21/22 0743 08/21/22 0745  BP: 109/73 101/75    Pulse: 95 97    Resp: 18     Temp: 98.2 F (36.8 C) 98.1 F (36.7 C)    TempSrc: Oral Oral    SpO2: 95% 95% 95% 98%  Weight:      Height:        Intake/Output Summary (Last 24 hours) at 08/21/2022 1124 Last data filed at 08/21/2022 0930 Gross per 24 hour  Intake 716.3 ml  Output 1975 ml  Net -1258.7 ml   Filed Weights   08/19/22 0500 08/20/22 0319 08/21/22 0504  Weight: 76.4 kg 77.4 kg 77.5 kg    Examination:  Gen: Awake, Alert, Oriented X 3,  HEENT: no JVD Lungs: Good air movement bilaterally, CTAB CVS: S1S2/RRR Abd: soft, Non tender, non distended, BS present Extremities: trace edema, R arm picc line Skin: no new rashes on exposed skin     Data Reviewed:   CBC: Recent Labs  Lab 08/16/22 0500 08/17/22 0227 08/18/22  0424 08/19/22 0113 08/20/22 0228  WBC 6.7 8.4 6.5 6.9 7.0  HGB 10.7* 10.4* 10.2* 10.3* 10.0*  HCT 33.4* 33.1* 31.9* 31.9* 31.3*  MCV 106.0* 104.4* 107.8* 107.4* 106.5*  PLT 184 204 165 181 183   Basic Metabolic Panel: Recent Labs  Lab  08/17/22 0227 08/18/22 0424 08/19/22 0113 08/20/22 0228 08/21/22 0327  NA 135 133* 135 134* 133*  K 4.8 4.6 4.7 4.2 5.0  CL 98 98 100 101 99  CO2 29 28 28 25 25   GLUCOSE 91 96 98 103* 90  BUN 25* 22* 18 24* 21*  CREATININE 1.16 0.91 0.87 0.89 0.88  CALCIUM 8.9 8.9 8.9 8.7* 8.9  MG 2.1 2.1 2.0 1.9 1.9   GFR: Estimated Creatinine Clearance: 84.5 mL/min (by C-G formula based on SCr of 0.88 mg/dL). Liver Function Tests: No results for input(s): "AST", "ALT", "ALKPHOS", "BILITOT", "PROT", "ALBUMIN" in the last 168 hours.  No results for input(s): "LIPASE", "AMYLASE" in the last 168 hours. No results for input(s): "AMMONIA" in the last 168 hours. Coagulation Profile: No results for input(s): "INR", "PROTIME" in the last 168 hours.  Cardiac Enzymes: No results for input(s): "CKTOTAL", "CKMB", "CKMBINDEX", "TROPONINI" in the last 168 hours. BNP (last 3 results) No results for input(s): "PROBNP" in the last 8760 hours. HbA1C: No results for input(s): "HGBA1C" in the last 72 hours. CBG: Recent Labs  Lab 08/21/22 0527  GLUCAP 115*   Lipid Profile: No results for input(s): "CHOL", "HDL", "LDLCALC", "TRIG", "CHOLHDL", "LDLDIRECT" in the last 72 hours. Thyroid Function Tests: No results for input(s): "TSH", "T4TOTAL", "FREET4", "T3FREE", "THYROIDAB" in the last 72 hours. Anemia Panel: No results for input(s): "VITAMINB12", "FOLATE", "FERRITIN", "TIBC", "IRON", "RETICCTPCT" in the last 72 hours. Urine analysis:    Component Value Date/Time   COLORURINE STRAW (A) 08/11/2022 1328   APPEARANCEUR CLEAR 08/11/2022 1328   LABSPEC 1.006 08/11/2022 1328   PHURINE 7.0 08/11/2022 1328   GLUCOSEU 150 (A) 08/11/2022 1328   HGBUR NEGATIVE 08/11/2022 1328   BILIRUBINUR NEGATIVE 08/11/2022 1328   KETONESUR NEGATIVE 08/11/2022 1328   PROTEINUR NEGATIVE 08/11/2022 1328   NITRITE NEGATIVE 08/11/2022 1328   LEUKOCYTESUR NEGATIVE 08/11/2022 1328   Sepsis  Labs: @LABRCNTIP (procalcitonin:4,lacticidven:4)  ) Recent Results (from the past 240 hour(s))  MRSA Next Gen by PCR, Nasal     Status: None   Collection Time: 08/12/22  4:58 AM   Specimen: Nasal Mucosa; Nasal Swab  Result Value Ref Range Status   MRSA by PCR Next Gen NOT DETECTED NOT DETECTED Final    Comment: (NOTE) The GeneXpert MRSA Assay (FDA approved for NASAL specimens only), is one component of a comprehensive MRSA colonization surveillance program. It is not intended to diagnose MRSA infection nor to guide or monitor treatment for MRSA infections. Test performance is not FDA approved in patients less than 49 years old. Performed at [facility], [address] [place], [place] [address]      Radiology Studies: No results found.   Scheduled Meds:  atorvastatin  80 mg Oral Daily   chlorhexidine  15 mL Mouth/Throat BID   Chlorhexidine Gluconate Cloth  6 each Topical Daily   dorzolamide-timolol  1 drop Both Eyes BID   feeding supplement  237 mL Oral BID BM   folic acid  1 mg Oral Daily   furosemide  40 mg Oral Daily   latanoprost  1 drop Both Eyes QHS   methotrexate  20 mg Oral Q Sat   mexiletine  250  mg Oral BID   mometasone-formoterol  2 puff Inhalation BID   multivitamin with minerals  1 tablet Oral Daily   pantoprazole  40 mg Oral QAC breakfast   umeclidinium bromide  1 puff Inhalation Daily   Continuous Infusions:  heparin 1,150 Units/hr (08/21/22 0615)   magnesium sulfate bolus IVPB     milrinone 0.375 mcg/kg/min (08/21/22 0615)     LOS: 11 days    Time spent:    [name], MD [facility]   08/21/2022, 11:24 AM

## 2022-08-21 NOTE — Consult Note (Signed)
Reason for Consult: dental consult pre-LVAD  Referring Physician: Meriam Sprague, MD  Charles Holmes is an 57 y.o. male.  CC:Bad teeth. No current pain.   HPI: Admitted 08/10/2022 for acute on chronic heart failure  Past Medical History:  Diagnosis Date   CAD (coronary artery disease)    CHF (congestive heart failure) (HCC)    GERD (gastroesophageal reflux disease)    Hyperlipidemia    Hypertension    Systolic heart failure (HCC) 2021   LVEF 18%, RVEF 38% on cardiac MRI 12/19/2019. possible cardiac sarcoidosis.   Wide-complex tachycardia 2021   wears LifeVest    Past Surgical History:  Procedure Laterality Date   BIV UPGRADE N/A 06/07/2021   Procedure: BIV ICD UPGRADE;  Surgeon: Regan Lemming, MD;  Location: [facility] INVASIVE CV LAB;  Service: Cardiovascular;  Laterality: N/A;   ICD IMPLANT N/A 02/20/2020   Procedure: ICD IMPLANT;  Surgeon: Regan Lemming, MD;  Location: [facility] INVASIVE CV LAB;  Service: Cardiovascular;  Laterality: N/A;   IR CT HEAD LTD  07/10/2022   IR PERCUTANEOUS ART THROMBECTOMY/INFUSION INTRACRANIAL INC DIAG ANGIO  07/10/2022   IR US GUIDE VASC ACCESS RIGHT  07/10/2022   RADIOLOGY WITH ANESTHESIA N/A 07/10/2022   Procedure: RADIOLOGY WITH ANESTHESIA;  Surgeon: Radiologist, Medication, MD;  Location: [facility] OR;  Service: Radiology;  Laterality: N/A;   RIGHT HEART CATH N/A 07/14/2022   Procedure: RIGHT HEART CATH;  Surgeon: Dolores Patty, MD;  Location: [facility] INVASIVE CV LAB;  Service: Cardiovascular;  Laterality: N/A;   RIGHT/LEFT HEART CATH AND CORONARY ANGIOGRAPHY N/A 12/16/2019   Procedure: RIGHT/LEFT HEART CATH AND CORONARY ANGIOGRAPHY;  Surgeon: [place]az[place], Peter M, MD;  Location: [facility] INVASIVE CV LAB;  Service: Cardiovascular;  Laterality: N/A;   RIGHT/LEFT HEART CATH AND CORONARY ANGIOGRAPHY N/A 08/10/2022   Procedure: RIGHT/LEFT HEART CATH AND CORONARY ANGIOGRAPHY;  Surgeon: Dolores Patty, MD;  Location: [facility] INVASIVE CV LAB;  Service: Cardiovascular;   Laterality: N/A;    Family History  Problem Relation Age of Onset   Hypertension Father    Heart disease Father    Heart disease Mother     Social History:  reports that he has quit smoking. His smoking use included cigarettes. He has never used smokeless tobacco. He reports current alcohol use of about 6.0 standard drinks of alcohol per week. He reports current drug use. Drug: Marijuana.  Allergies:  Allergies  Allergen Reactions   Amiodarone     Severe tremors   Percocet [Oxycodone-Acetaminophen] Itching    Medications: I have reviewed the patient's current medications.  Results for orders placed or performed during the hospital encounter of 08/10/22 (from the past 48 hour(s))  CBC     Status: Abnormal   Collection Time: 08/20/22  2:28 AM  Result Value Ref Range   WBC 7.0 4.0 - 10.5 K/uL   RBC 2.94 (L) 4.22 - 5.81 MIL/uL   Hemoglobin 10.0 (L) 13.0 - 17.0 g/dL   HCT 74.2 (L) 59.5 - 63.8 %   MCV 106.5 (H) 80.0 - 100.0 fL   MCH 34.0 26.0 - 34.0 pg   MCHC 31.9 30.0 - 36.0 g/dL   RDW 75.6 (H) 43.3 - 29.5 %   Platelets 183 150 - 400 K/uL   nRBC 0.0 0.0 - 0.2 %    Comment: Performed at [facility], [address], [place], [place] [address]  Heparin level (unfractionated)     Status: None   Collection Time: 08/20/22  2:28 AM  Result Value Ref Range   Heparin Unfractionated 0.38 0.30 - 0.70 IU/mL    Comment: (NOTE) The clinical reportable range upper limit is being lowered to >1.10 to align with the FDA approved guidance for the current laboratory assay.  If heparin results are below expected values, and patient dosage has  been confirmed, suggest follow up testing of antithrombin III levels. Performed at [facility], [address], [place], [place] [address]   Basic metabolic panel     Status: Abnormal   Collection Time: 08/20/22  2:28 AM  Result Value Ref Range   Sodium 134 (L) 135 - 145 mmol/L   Potassium 4.2 3.5 - 5.1 mmol/L   Chloride 101 98 - 111  mmol/L   CO2 25 22 - 32 mmol/L   Glucose, Bld 103 (H) 70 - 99 mg/dL    Comment: Glucose reference range applies only to samples taken after fasting for at least 8 hours.   BUN 24 (H) 6 - 20 mg/dL   Creatinine, Ser 2.95 0.61 - 1.24 mg/dL   Calcium 8.7 (L) 8.9 - 10.3 mg/dL   GFR, Estimated >62 >13 mL/min    Comment: (NOTE) Calculated using the CKD-EPI Creatinine Equation (2021)    Anion gap 8 5 - 15    Comment: Performed at [facility], [address] [place], [place] [address]  Magnesium     Status: None   Collection Time: 08/20/22  2:28 AM  Result Value Ref Range   Magnesium 1.9 1.7 - 2.4 mg/dL    Comment: Performed at [facility], [address] [place], [place] [address]  Cooxemetry Panel (carboxy, met, total hgb, O2 sat)     Status: Abnormal   Collection Time: 08/20/22  3:10 AM  Result Value Ref Range   Total hemoglobin 10.1 (L) 12.0 - 16.0 g/dL   O2 Saturation 29.5 %   Carboxyhemoglobin 2.1 (H) 0.5 - 1.5 %   Methemoglobin <0.7 0.0 - 1.5 %    Comment: Performed at [facility], [address] [place], [place] [address]  Cooxemetry Panel (carboxy, met, total hgb, O2 sat)     Status: Abnormal   Collection Time: 08/21/22  3:09 AM  Result Value Ref Range   Total hemoglobin 9.7 (L) 12.0 - 16.0 g/dL   O2 Saturation 24.4 %   Carboxyhemoglobin 2.6 (H) 0.5 - 1.5 %   Methemoglobin <0.7 0.0 - 1.5 %    Comment: Performed at [facility], [address] [place], [place] [address]  Heparin level (unfractionated)     Status: Abnormal   Collection Time: 08/21/22  3:27 AM  Result Value Ref Range   Heparin Unfractionated 0.28 (L) 0.30 - 0.70 IU/mL    Comment: (NOTE) The clinical reportable range upper limit is being lowered to >1.10 to align with the FDA approved guidance for the current laboratory assay.  If heparin results are below expected values, and patient dosage has  been confirmed, suggest follow up testing of antithrombin III levels. Performed at  [facility], [address], [place], [place] [address]   Basic metabolic panel     Status: Abnormal   Collection Time: 08/21/22  3:27 AM  Result Value Ref Range   Sodium 133 (L) 135 - 145 mmol/L   Potassium 5.0 3.5 - 5.1 mmol/L   Chloride 99 98 - 111 mmol/L   CO2 25 22 - 32 mmol/L   Glucose, Bld 90 70 -  99 mg/dL    Comment: Glucose reference range applies only to samples taken after fasting for at least 8 hours.   BUN 21 (H) 6 - 20 mg/dL   Creatinine, Ser 1.61 0.61 - 1.24 mg/dL   Calcium 8.9 8.9 - 09.6 mg/dL   GFR, Estimated >04 >54 mL/min    Comment: (NOTE) Calculated using the CKD-EPI Creatinine Equation (2021)    Anion gap 9 5 - 15    Comment: Performed at [facility], [address] [place], [place] [address]  Magnesium     Status: None   Collection Time: 08/21/22  3:27 AM  Result Value Ref Range   Magnesium 1.9 1.7 - 2.4 mg/dL    Comment: Performed at [facility], [address] [place], [place] [address]  Glucose, capillary     Status: Abnormal   Collection Time: 08/21/22  5:27 AM  Result Value Ref Range   Glucose-Capillary 115 (H) 70 - 99 mg/dL    Comment: Glucose reference range applies only to samples taken after fasting for at least 8 hours.    No results found.  ROS Blood pressure 109/75, pulse (!) 199, temperature 98.6 F (37 C), temperature source Oral, resp. rate 18, height 5\' 7"  (1.702 m), weight 77.5 kg, SpO2 92%. General appearance: alert, cooperative, and no distress Head: Normocephalic, without obvious abnormality, atraumatic Eyes: negative Nose: Nares normal. Septum midline. Mucosa normal. No drainage or sinus tenderness. Throat: Teeth # 2, 3, 8, 9, 11, 12, 15, 18, 21, 22, 27, 29 remain. Multiple caries and generalized bone loss.  Neck: no adenopathy  Assessment/Plan:Remaining teeth non-restorable due to bone loss, decay.  Plan extraction remaining teeth  With GA on 08/22/2022.   Ocie Doyne 08/21/2022, 3:18 PM

## 2022-08-21 NOTE — Progress Notes (Signed)
Night coverage  Notified by RN that when patient got up to use the bathroom this morning, he felt dizzy and was sweating.  His heart rate was in the 120s at that time and blood pressure 119/86.  CBG 115.  Symptoms have now improved and he is resting comfortably.  Stat EKG ordered and currently pending.  I have requested RN to notify cardiology as well.

## 2022-08-21 NOTE — Progress Notes (Signed)
Physical Therapy Treatment Patient Details Name: Charles Holmes MRN: [id number] DOB: 12-15-1965 Today's Date: 08/21/2022   History of Present Illness Pt is 57 year old presented to [facility] on  08/10/22 for acute on chronic systolic heart failure. Pt being evaluated for LVAD. PMH - CAD, CHF, HLD, HTN, systolic HF, ICD implant, rt CVA    PT Comments  Pt received sitting in the recliner and agreeable to session. Pt reporting episode earlier of severe DOE when ambulating to the bathroom. Pt able to perform this session with mild DOE. During , pt is able to walk 64ft with 2 short standing rest breaks. Pt demonstrates slight instability without AD, but is able to perform gait trial with up to supervision for safety. Pt continues to benefit from PT services to progress toward functional mobility goals.      If plan is discharge home, recommend the following:     Can travel by private vehicle        Equipment Recommendations  None recommended by PT    Recommendations for Other Services       Precautions / Restrictions Precautions Precautions: None Restrictions Weight Bearing Restrictions: No RUE Weight Bearing: Non weight bearing     Mobility  Bed Mobility               General bed mobility comments: Pt beginning and ending session in recliner    Transfers Overall transfer level: Needs assistance Equipment used: None Transfers: Sit to/from Stand Sit to Stand: Modified independent (Device/Increase time)           General transfer comment: From recliner    Ambulation/Gait Ambulation/Gait assistance: Supervision Gait Distance (Feet): 670 Feet Assistive device: None Gait Pattern/deviations: Decreased stride length, Step-through pattern       General Gait Details: Pt ambulating without AD with supervision for safety. Pt slowing pace for energy concervation and demonstrates no overt LOB       Balance Overall balance assessment: No apparent balance deficits  (not formally assessed)                                          Cognition Arousal/Alertness: Awake/alert Behavior During Therapy: WFL for tasks assessed/performed Overall Cognitive Status: Within Functional Limits for tasks assessed                                          Exercises      General Comments General comments (skin integrity, edema, etc.): VSS on RA      Pertinent Vitals/Pain Pain Assessment Pain Assessment: No/denies pain     PT Goals (current goals can now be found in the care plan section) Acute Rehab PT Goals Patient Stated Goal: to continue to build strength PT Goal Formulation: With patient Time For Goal Achievement: 08/27/22 Potential to Achieve Goals: Good Progress towards PT goals: Progressing toward goals    Frequency    Min 1X/week      PT Plan Current plan remains appropriate       AM-PAC PT "6 Clicks" Mobility   Outcome Measure  Help needed turning from your back to your side while in a flat bed without using bedrails?: None Help needed moving from lying on your back to sitting on the side of a flat bed without  using bedrails?: None Help needed moving to and from a bed to a chair (including a wheelchair)?: None Help needed standing up from a chair using your arms (e.g., wheelchair or bedside chair)?: None Help needed to walk in hospital room?: None Help needed climbing 3-5 steps with a railing? : A Little 6 Click Score: 23    End of Session Equipment Utilized During Treatment: Gait belt Activity Tolerance: Patient tolerated treatment well Patient left: with call bell/phone within reach;in chair Nurse Communication: Mobility status PT Visit Diagnosis: Muscle weakness (generalized) (M62.81)     Time: 1610-9604 PT Time Calculation (min) (ACUTE ONLY): 19 min  Charges:    $Gait Training: 8-22 mins PT General Charges $$ ACUTE PT VISIT: 1 Visit                     Johny Shock, PTA Acute  Rehabilitation Services Secure Chat Preferred  [phone]    Johny Shock 08/21/2022, 12:29 PM

## 2022-08-22 ENCOUNTER — Inpatient Hospital Stay (HOSPITAL_COMMUNITY): Payer: Self-pay | Admitting: Certified Registered Nurse Anesthetist

## 2022-08-22 ENCOUNTER — Encounter (HOSPITAL_COMMUNITY)
Admission: EM | Disposition: A | Discharge status: Rehabilitation Facility | Payer: Self-pay | Source: Home / Self Care | Attending: Internal Medicine

## 2022-08-22 ENCOUNTER — Inpatient Hospital Stay (HOSPITAL_COMMUNITY): Payer: Medicare Other | Admitting: Certified Registered Nurse Anesthetist

## 2022-08-22 ENCOUNTER — Other Ambulatory Visit: Payer: Self-pay

## 2022-08-22 ENCOUNTER — Encounter (HOSPITAL_COMMUNITY): Payer: Self-pay | Admitting: Internal Medicine

## 2022-08-22 DIAGNOSIS — Z87891 Personal history of nicotine dependence: Secondary | ICD-10-CM

## 2022-08-22 DIAGNOSIS — I110 Hypertensive heart disease with heart failure: Secondary | ICD-10-CM

## 2022-08-22 DIAGNOSIS — I5023 Acute on chronic systolic (congestive) heart failure: Secondary | ICD-10-CM

## 2022-08-22 DIAGNOSIS — K0850 Unsatisfactory restoration of tooth, unspecified: Secondary | ICD-10-CM

## 2022-08-22 HISTORY — PX: TOOTH EXTRACTION: SHX859

## 2022-08-22 LAB — MAGNESIUM: Magnesium: 2 mg/dL (ref 1.7–2.4)

## 2022-08-22 LAB — BASIC METABOLIC PANEL
Anion gap: 10 (ref 5–15)
BUN: 25 mg/dL — ABNORMAL HIGH (ref 6–20)
CO2: 26 mmol/L (ref 22–32)
Calcium: 8.8 mg/dL — ABNORMAL LOW (ref 8.9–10.3)
Chloride: 98 mmol/L (ref 98–111)
Creatinine, Ser: 1.18 mg/dL (ref 0.61–1.24)
GFR, Estimated: >60 mL/min (ref >60)
Glucose, Bld: 171 mg/dL — ABNORMAL HIGH (ref 70–99)
Potassium: 4.3 mmol/L (ref 3.5–5.1)
Sodium: 134 mmol/L — ABNORMAL LOW (ref 135–145)

## 2022-08-22 SURGERY — DENTAL RESTORATION/EXTRACTIONS
Anesthesia: General

## 2022-08-22 MED ORDER — CEFAZOLIN SODIUM-DEXTROSE 2-3 GM-%(50ML) IV SOLR
INTRAVENOUS | Status: DC | PRN
  Administered 2022-08-22: 2 g via INTRAVENOUS

## 2022-08-22 MED ORDER — ROCURONIUM BROMIDE 10 MG/ML (PF) SYRINGE
PREFILLED_SYRINGE | INTRAVENOUS | Status: DC | PRN
  Administered 2022-08-22: 60 mg via INTRAVENOUS

## 2022-08-22 MED ORDER — LIDOCAINE-EPINEPHRINE 2 %-1:100000 IJ SOLN
INTRAMUSCULAR | Status: DC | PRN
  Administered 2022-08-22: 18 mL via INTRADERMAL

## 2022-08-22 MED ORDER — HYDROMORPHONE HCL 1 MG/ML IJ SOLN: 0.2500 mg | INTRAMUSCULAR | Status: DC | PRN

## 2022-08-22 MED ORDER — SUGAMMADEX SODIUM 200 MG/2ML IV SOLN
INTRAVENOUS | Status: DC | PRN
  Administered 2022-08-22: 200 mg via INTRAVENOUS

## 2022-08-22 MED ORDER — SODIUM CHLORIDE 0.9 % IR SOLN
Status: DC | PRN
  Administered 2022-08-22: 1000 mL

## 2022-08-22 MED ORDER — ACETAMINOPHEN 10 MG/ML IV SOLN
INTRAVENOUS | Status: DC | PRN
  Administered 2022-08-22: 1000 mg via INTRAVENOUS

## 2022-08-22 MED ORDER — ORAL CARE MOUTH RINSE: 15.0000 mL | Freq: Once | OROMUCOSAL | Status: DC

## 2022-08-22 MED ORDER — OXYMETAZOLINE HCL 0.05 % NA SOLN
NASAL | Status: AC
  Filled 2022-08-22: qty 30

## 2022-08-22 MED ORDER — ONDANSETRON HCL 4 MG/2ML IJ SOLN
INTRAMUSCULAR | Status: DC | PRN
Start: 2022-08-22 — End: 2022-08-22
  Administered 2022-08-22: 4 mg via INTRAVENOUS

## 2022-08-22 MED ORDER — NOREPINEPHRINE 4 MG/250ML-% IV SOLN
INTRAVENOUS | Status: DC | PRN
Start: 2022-08-22 — End: 2022-08-22
  Administered 2022-08-22: 2 ug/min via INTRAVENOUS

## 2022-08-22 MED ORDER — DIPHENHYDRAMINE HCL 50 MG/ML IJ SOLN: 25.0000 mg | Freq: Four times a day (QID) | INTRAMUSCULAR | Status: DC | PRN

## 2022-08-22 MED ORDER — DEXAMETHASONE SODIUM PHOSPHATE 10 MG/ML IJ SOLN
INTRAMUSCULAR | Status: DC | PRN
  Administered 2022-08-22: 10 mg via INTRAVENOUS

## 2022-08-22 MED ORDER — LACTATED RINGERS IV SOLN: INTRAVENOUS | Status: DC

## 2022-08-22 MED ORDER — FENTANYL CITRATE (PF) 250 MCG/5ML IJ SOLN
INTRAMUSCULAR | Status: DC | PRN
  Administered 2022-08-22: 100 ug via INTRAVENOUS

## 2022-08-22 MED ORDER — MIDAZOLAM HCL 2 MG/2ML IJ SOLN
INTRAMUSCULAR | Status: DC | PRN
  Administered 2022-08-22: 1 mg via INTRAVENOUS

## 2022-08-22 MED ORDER — PROPOFOL 10 MG/ML IV BOLUS
INTRAVENOUS | Status: AC
  Filled 2022-08-22: qty 20

## 2022-08-22 MED ORDER — LIDOCAINE-EPINEPHRINE 2 %-1:100000 IJ SOLN
INTRAMUSCULAR | Status: AC
  Filled 2022-08-22: qty 1

## 2022-08-22 MED ORDER — CHLORHEXIDINE GLUCONATE 0.12 % MT SOLN: 15.0000 mL | Freq: Once | OROMUCOSAL | Status: DC

## 2022-08-22 MED ORDER — FENTANYL CITRATE (PF) 250 MCG/5ML IJ SOLN
INTRAMUSCULAR | Status: AC
  Filled 2022-08-22: qty 5

## 2022-08-22 MED ORDER — SODIUM CHLORIDE 0.9 % IV SOLN: INTRAVENOUS | Status: DC | PRN

## 2022-08-22 MED ORDER — ACETAMINOPHEN 10 MG/ML IV SOLN
INTRAVENOUS | Status: AC
  Filled 2022-08-22: qty 100

## 2022-08-22 MED ORDER — HEMOSTATIC AGENTS (NO CHARGE) OPTIME
TOPICAL | Status: DC | PRN
Start: 2022-08-22 — End: 2022-08-22
  Administered 2022-08-22: 1 via TOPICAL

## 2022-08-22 MED ORDER — SODIUM CHLORIDE (PF) 0.9 % IJ SOLN
INTRAMUSCULAR | Status: AC
  Filled 2022-08-22: qty 10

## 2022-08-22 MED ORDER — MIDAZOLAM HCL (PF) 10 MG/2ML IJ SOLN
INTRAMUSCULAR | Status: AC
  Filled 2022-08-22: qty 2

## 2022-08-22 MED ORDER — ETOMIDATE 2 MG/ML IV SOLN
INTRAVENOUS | Status: AC
  Filled 2022-08-22: qty 10

## 2022-08-22 MED ORDER — FUROSEMIDE 10 MG/ML IJ SOLN
80.0000 mg | Freq: Once | INTRAMUSCULAR | Status: AC
  Administered 2022-08-22: 80 mg via INTRAVENOUS
  Filled 2022-08-22: qty 8

## 2022-08-22 MED ORDER — PROPOFOL 500 MG/50ML IV EMUL
INTRAVENOUS | Status: DC | PRN
  Administered 2022-08-22: 125 ug/kg/min via INTRAVENOUS

## 2022-08-22 MED ORDER — HYDROCODONE-ACETAMINOPHEN 10-325 MG PO TABS
1.0000 | ORAL_TABLET | ORAL | Status: DC | PRN
  Administered 2022-08-23 – 2022-08-28 (×8): 1 via ORAL
  Filled 2022-08-22 (×8): qty 1

## 2022-08-22 MED ORDER — 0.9 % SODIUM CHLORIDE (POUR BTL) OPTIME
TOPICAL | Status: DC | PRN
  Administered 2022-08-22: 1000 mL

## 2022-08-22 MED ORDER — ETOMIDATE 2 MG/ML IV SOLN
INTRAVENOUS | Status: DC | PRN
  Administered 2022-08-22: 12 mg via INTRAVENOUS

## 2022-08-22 SURGICAL SUPPLY — 35 items
BAG COUNTER SPONGE SURGICOUNT (BAG) IMPLANT
BAG SPNG CNTER NS LX DISP (BAG)
BLADE SURG 15 STRL LF DISP TIS (BLADE) ×1 IMPLANT
BLADE SURG 15 STRL SS (BLADE) ×1
BUR CROSS CUT FISSURE 1.6 (BURR) ×1 IMPLANT
BUR EGG ELITE 4.0 (BURR) ×1 IMPLANT
CANISTER SUCT 3000ML PPV (MISCELLANEOUS) ×1 IMPLANT
COVER SURGICAL LIGHT HANDLE (MISCELLANEOUS) ×1 IMPLANT
GAUZE PACKING FOLDED 2 STR (GAUZE/BANDAGES/DRESSINGS) ×1 IMPLANT
GLOVE BIO SURGEON STRL SZ 6.5 (GLOVE) IMPLANT
GLOVE BIO SURGEON STRL SZ7 (GLOVE) IMPLANT
GLOVE BIO SURGEON STRL SZ8 (GLOVE) ×1 IMPLANT
GLOVE BIOGEL PI IND STRL 6.5 (GLOVE) IMPLANT
GLOVE BIOGEL PI IND STRL 7.0 (GLOVE) IMPLANT
GOWN STRL REUS W/ TWL LRG LVL3 (GOWN DISPOSABLE) ×1 IMPLANT
GOWN STRL REUS W/ TWL XL LVL3 (GOWN DISPOSABLE) ×1 IMPLANT
GOWN STRL REUS W/TWL LRG LVL3 (GOWN DISPOSABLE) ×1
GOWN STRL REUS W/TWL XL LVL3 (GOWN DISPOSABLE) ×1
IV NS 1000ML (IV SOLUTION) ×1
IV NS 1000ML BAXH (IV SOLUTION) ×1 IMPLANT
KIT BASIN OR (CUSTOM PROCEDURE TRAY) ×1 IMPLANT
KIT TURNOVER KIT B (KITS) ×1 IMPLANT
NDL HYPO 25GX1X1/2 BEV (NEEDLE) ×2 IMPLANT
NEEDLE HYPO 25GX1X1/2 BEV (NEEDLE) ×2 IMPLANT
NS IRRIG 1000ML POUR BTL (IV SOLUTION) ×1 IMPLANT
PAD ARMBOARD 7.5X6 YLW CONV (MISCELLANEOUS) ×1 IMPLANT
SLEEVE IRRIGATION ELITE 7 (MISCELLANEOUS) ×1 IMPLANT
SPIKE FLUID TRANSFER (MISCELLANEOUS) ×1 IMPLANT
SPONGE SURGIFOAM ABS GEL 12-7 (HEMOSTASIS) IMPLANT
SUT CHROMIC 3 0 PS 2 (SUTURE) ×1 IMPLANT
SYR BULB IRRIG 60ML STRL (SYRINGE) ×1 IMPLANT
SYR CONTROL 10ML LL (SYRINGE) ×1 IMPLANT
TRAY ENT MC OR (CUSTOM PROCEDURE TRAY) ×1 IMPLANT
TUBING IRRIGATION (MISCELLANEOUS) ×1 IMPLANT
YANKAUER SUCT BULB TIP NO VENT (SUCTIONS) ×1 IMPLANT

## 2022-08-22 NOTE — Progress Notes (Signed)
Prior Authorization obtained for Insertion of Left Ventricular Assist Device (Heartmate III). Authorization # [id number]. Expiration of 02/21/23.  Simmie Davies RN, BSN VAD Coordinator 24/7 Pager [phone]

## 2022-08-22 NOTE — Anesthesia Postprocedure Evaluation (Signed)
Anesthesia Post Note  Patient: Charles Holmes  Procedure(s) Performed: DENTAL RESTORATION/EXTRACTIONS     Patient location during evaluation: PACU Anesthesia Type: General Level of consciousness: awake and alert Pain management: pain level controlled Vital Signs Assessment: post-procedure vital signs reviewed and stable Respiratory status: spontaneous breathing, nonlabored ventilation, respiratory function stable and patient connected to nasal cannula oxygen Cardiovascular status: blood pressure returned to baseline and stable Postop Assessment: no apparent nausea or vomiting Anesthetic complications: no  No notable events documented.  Last Vitals:  Vitals:   08/22/22 1530 08/22/22 1625  BP: 109/75 110/79  Pulse: 100 100  Resp: 16 20  Temp: 36.8 C 37.2 C  SpO2: 95% 94%    Last Pain:  Vitals:   08/22/22 1625  TempSrc: Axillary  PainSc:                  ,W. EDMOND

## 2022-08-22 NOTE — Anesthesia Preprocedure Evaluation (Addendum)
Anesthesia Evaluation  Patient identified by MRN, date of birth, ID band Patient awake    Reviewed: Allergy & Precautions, H&P , NPO status , Patient's Chart, lab work & pertinent test results, reviewed documented beta blocker date and time   Airway Mallampati: II  TM Distance: >3 FB Neck ROM: Full    Dental no notable dental hx. (+) Teeth Intact, Dental Advisory Given   Pulmonary former smoker   Pulmonary exam normal breath sounds clear to auscultation       Cardiovascular hypertension, Pt. on medications and Pt. on home beta blockers + CAD and +CHF   Rhythm:Regular Rate:Normal     Neuro/Psych CVA  negative psych ROS   GI/Hepatic Neg liver ROS,GERD  Medicated,,  Endo/Other  negative endocrine ROS    Renal/GU negative Renal ROS  negative genitourinary   Musculoskeletal   Abdominal   Peds  Hematology  (+) Blood dyscrasia, anemia   Anesthesia Other Findings   Reproductive/Obstetrics negative OB ROS                             Anesthesia Physical Anesthesia Plan  ASA: 4  Anesthesia Plan: General   Post-op Pain Management: Ofirmev IV (intra-op)*   Induction: Intravenous  PONV Risk Score and Plan: 3 and Ondansetron, Dexamethasone and Midazolam  Airway Management Planned: Oral ETT  Additional Equipment: Arterial line  Intra-op Plan:   Post-operative Plan: Extubation in OR  Informed Consent: I have reviewed the patients History and Physical, chart, labs and discussed the procedure including the risks, benefits and alternatives for the proposed anesthesia with the patient or authorized representative who has indicated his/her understanding and acceptance.     Dental advisory given  Plan Discussed with: CRNA  Anesthesia Plan Comments:        Anesthesia Quick Evaluation

## 2022-08-22 NOTE — H&P (Signed)
H&P documentation  -History and Physical Reviewed  -Patient has been re-examined  -No change in the plan of care     

## 2022-08-22 NOTE — Progress Notes (Signed)
PROGRESS NOTE    Charles Holmes  MVH:[id number] DOB: 04-Feb-1965 DOA: 08/10/2022 PCP: [name], PA-C  Charles Holmes is a 57 y.o. 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 tach, cardiac sarcoidosis, paroxysmal A-fib presented to the ED with worsening dyspnea, fatigue X 12 days, in the ER he was volume overloaded, tachypneic with 3+ edema, chest x-ray noted cardiomegaly pulmonary vascular congestion bilateral infiltrates -Admitted, treated with diuretics -RHC 7/25 noted low cardiac output, high filling pressures, concern for cardiogenic shock, started on milrinone and nor epi -7/26 CT surgery consulted for possible LVAD -7/27 Lasix drip discontinued -Medically stable, awaiting LVAD -Seen by Dr. Ocie Doyne 8/5, plan for dental extraction 8/6 followed by LVAD next week   Subjective: -Diuresed with IV Lasix, breathing improving, seen by [name]  Assessment and Plan:  Acute on chronic systolic CHF, cardiogenic shock [name] Moderate pericardial effusion -Echo Ef <20%, severe dilatation of LV cavity, akinetic, hypokinetic entire anterior wall, entire lateral wall, anterior septum, entire inferior wall, and apex. RV systolic function preserved, moderate pericardial effusion, moderate mitral valve regurgitation, moderate TR.  -Cardiac MRI in 12/21 was concerning for sarcoidosis -RHC this admit with nonobstructive CAD, severely elevated filling pressures and low Fick -Currently on milrinone, low-dose Lasix -Heart failure team and T CTS following, VAD workup underway -Plan for dental extractions today, greatly appreciate Dr. Randa Evens help  CAD (coronary artery disease) Coronary angiography with proximal Cx 20 % stenosis, mid LAD 50% stenosis. Continue ASA/statin  HTN (hypertension) Continue milrinone.   Ventricular tachycardia (HCC) Patient on mexiletine.  Repleted mag yesterday  Cardiac  sarcoidosis Continue with methotrexate.   AKI (acute kidney injury) (HCC) Hypokalemia.  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  Anticoagulation with heparin IV for now.   Iron deficiency anemia Continue iron supplementation.  -Given IV iron  Severe COPD -stable, continue Dulera, as needed albuterol  DVT prophylaxis: Hep gtt Code Status: Full Code Family Communication: none present Disposition Plan:   Consultants:    Procedures:   Antimicrobials:    Objective: Vitals:   08/22/22 0340 08/22/22 0604 08/22/22 0734 08/22/22 0830  BP: 107/67  110/75   Pulse: 91  74   Resp: 17  16   Temp: 98.6 F (37 C)  97.8 F (36.6 C)   TempSrc: Oral  Oral   SpO2: 92%  95% 96%  Weight:  77.1 kg    Height:        Intake/Output Summary (Last 24 hours) at 08/22/2022 1210 Last data filed at 08/22/2022 1000 Gross per 24 hour  Intake 250 ml  Output 3450 ml  Net -3200 ml   Filed Weights   08/20/22 0319 08/21/22 0504 08/22/22 0604  Weight: 77.4 kg 77.5 kg 77.1 kg    Examination:  Gen: Awake, Alert, Oriented X 3,  HEENT: no JVD Lungs: Good air movement bilaterally, CTAB CVS: S1S2/RRR Abd: soft, Non tender, non distended, BS present Extremities: trace edema, R arm picc line Skin: no new rashes on exposed skin     Data Reviewed:   CBC: Recent Labs  Lab 08/16/22 0500 08/17/22 0227 08/18/22 0424 08/19/22 0113 08/20/22 0228  WBC 6.7 8.4 6.5 6.9 7.0  HGB 10.7* 10.4* 10.2* 10.3* 10.0*  HCT  33.4* 33.1* 31.9* 31.9* 31.3*  MCV 106.0* 104.4* 107.8* 107.4* 106.5*  PLT 184 204 165 181 183   Basic Metabolic Panel: Recent Labs  Lab 08/18/22 0424 08/19/22 0113 08/20/22 0228 08/21/22 0327 08/22/22 0500  NA 133* 135 134* 133* 133*   K 4.6 4.7 4.2 5.0 3.9  CL 98 100 101 99 97*  CO2 28 28 25 25 28   GLUCOSE 96 98 103* 90 117*  BUN 22* 18 24* 21* 23*  CREATININE 0.91 0.87 0.89 0.88 1.09  CALCIUM 8.9 8.9 8.7* 8.9 8.5*  MG 2.1 2.0 1.9 1.9 2.2   GFR: Estimated Creatinine Clearance: 68.2 mL/min (by C-G formula based on SCr of 1.09 mg/dL). Liver Function Tests: No results for input(s): "AST", "ALT", "ALKPHOS", "BILITOT", "PROT", "ALBUMIN" in the last 168 hours.  No results for input(s): "LIPASE", "AMYLASE" in the last 168 hours. No results for input(s): "AMMONIA" in the last 168 hours. Coagulation Profile: No results for input(s): "INR", "PROTIME" in the last 168 hours.  Cardiac Enzymes: No results for input(s): "CKTOTAL", "CKMB", "CKMBINDEX", "TROPONINI" in the last 168 hours. BNP (last 3 results) No results for input(s): "PROBNP" in the last 8760 hours. HbA1C: No results for input(s): "HGBA1C" in the last 72 hours. CBG: Recent Labs  Lab 08/21/22 0527  GLUCAP 115*   Lipid Profile: No results for input(s): "CHOL", "HDL", "LDLCALC", "TRIG", "CHOLHDL", "LDLDIRECT" in the last 72 hours. Thyroid Function Tests: No results for input(s): "TSH", "T4TOTAL", "FREET4", "T3FREE", "THYROIDAB" in the last 72 hours. Anemia Panel: No results for input(s): "VITAMINB12", "FOLATE", "FERRITIN", "TIBC", "IRON", "RETICCTPCT" in the last 72 hours. Urine analysis:    Component Value Date/Time   COLORURINE STRAW (A) 08/11/2022 1328   APPEARANCEUR CLEAR 08/11/2022 1328   LABSPEC 1.006 08/11/2022 1328   PHURINE 7.0 08/11/2022 1328   GLUCOSEU 150 (A) 08/11/2022 1328   HGBUR NEGATIVE 08/11/2022 1328   BILIRUBINUR NEGATIVE 08/11/2022 1328   KETONESUR NEGATIVE 08/11/2022 1328   PROTEINUR NEGATIVE 08/11/2022 1328   NITRITE NEGATIVE 08/11/2022 1328   LEUKOCYTESUR NEGATIVE 08/11/2022 1328   Sepsis Labs: @LABRCNTIP (procalcitonin:4,lacticidven:4)  ) No results found for this or any previous visit (from the past 240 hour(s)).     Radiology Studies: No results found.   Scheduled Meds:  atorvastatin  80 mg Oral Daily   chlorhexidine  15 mL Mouth/Throat BID   Chlorhexidine Gluconate Cloth  6 each Topical Daily   dorzolamide-timolol  1 drop Both Eyes BID   feeding supplement  237 mL Oral BID BM   folic acid  1 mg Oral Daily   latanoprost  1 drop Both Eyes QHS   methotrexate  20 mg Oral Q Sat   mexiletine  250 mg Oral BID   mometasone-formoterol  2 puff Inhalation BID   multivitamin with minerals  1 tablet Oral Daily   pantoprazole  40 mg Oral QAC breakfast   umeclidinium bromide  1 puff Inhalation Daily   Continuous Infusions:   ceFAZolin (ANCEF) IV     milrinone 0.375 mcg/kg/min (08/22/22 0239)     LOS: 12 days    Time spent:    [name], MD [facility]   08/22/2022, 12:10 PM

## 2022-08-22 NOTE — Op Note (Signed)
08/22/2022  2:24 PM  PATIENT:  Charles Holmes  57 y.o. male  PRE-OPERATIVE DIAGNOSIS:  NON RESTORABLE TEETH # 2, 3, 8, 9, 11, 12, 15, 18, 21, 22, 27, 29  POST-OPERATIVE DIAGNOSIS:  SAME+ NON-RESTORABLE TOOTH # 17.  PROCEDURE:  Procedure(s): DENTAL EXTRACTIONS 2, 3, 8, 9, 11, 12, 15, 17, 18, 21, 22, 27, 29  SURGEON:  Surgeon(s): Ocie Doyne, DMD  ANESTHESIA:   local and general  EBL:  minimal  DRAINS: none   SPECIMEN:  No Specimen  COUNTS:  YES  PLAN OF CARE: Discharge to home after PACU  PATIENT DISPOSITION:  PACU - hemodynamically stable.   PROCEDURE DETAILS: Dictation #[id number]  Georgia Lopes, DMD 08/22/2022 2:24 PM

## 2022-08-22 NOTE — Anesthesia Procedure Notes (Signed)
Procedure Name: Intubation Date/Time: 08/22/2022 1:36 PM  Performed by: Samara Deist, CRNAPre-anesthesia Checklist: Patient identified, Emergency Drugs available, Suction available and Patient being monitored Patient Re-evaluated:Patient Re-evaluated prior to induction Oxygen Delivery Method: Circle System Utilized Preoxygenation: Pre-oxygenation with 100% oxygen Induction Type: IV induction Ventilation: Mask ventilation without difficulty and Oral airway inserted - appropriate to patient size Laryngoscope Size: Glidescope and 4 Grade View: Grade I Nasal Tubes: Nasal Rae, Nasal prep performed and Right Tube size: 7.5 mm Number of attempts: 1 Placement Confirmation: ETT inserted through vocal cords under direct vision, positive ETCO2 and breath sounds checked- equal and bilateral Tube secured with: Tape Dental Injury: Teeth and Oropharynx as per pre-operative assessment

## 2022-08-22 NOTE — Transfer of Care (Signed)
Immediate Anesthesia Transfer of Care Note  Patient: Charles Holmes  Procedure(s) Performed: DENTAL RESTORATION/EXTRACTIONS  Patient Location: PACU  Anesthesia Type:General  Level of Consciousness: awake, alert , and oriented  Airway & Oxygen Therapy: Patient Spontanous Breathing and Patient connected to nasal cannula oxygen  Post-op Assessment: Report given to RN and Post -op Vital signs reviewed and stable  Post vital signs: Reviewed and stable  Last Vitals:  Vitals Value Taken Time  BP 104/75 08/22/22 1445  Temp    Pulse 99 08/22/22 1447  Resp 16 08/22/22 1447  SpO2 93 % 08/22/22 1447  Vitals shown include unfiled device data.  Last Pain:  Vitals:   08/22/22 1226  TempSrc:   PainSc: 0-No pain      Patients Stated Pain Goal: 2 (08/10/22 2000)  Complications: No notable events documented.

## 2022-08-22 NOTE — Progress Notes (Addendum)
Advanced Heart Failure Rounding Note  PCP-Cardiologist: Norman Herrlich, MD   Subjective:    07/25: Admit with cardiogenic shock. Started milrinone and NE.  Planning for extraction of remaining teeth under GA this afternoon.  CO-OX 61% on 0.375 milrinone  CVP 8. Good diuresis with IV lasix but weight unchanged. Just got AM dose of lasix.  Feeling well. No dyspnea. Anxious about upcoming surgeries.    Objective:   Weight Range: 77.1 kg Body mass index is 26.63 kg/m.   Vital Signs:   Temp:  [97.8 F (36.6 C)-98.7 F (37.1 C)] 97.8 F (36.6 C) (08/06 0734) Pulse Rate:  [74-199] 74 (08/06 0734) Resp:  [16-18] 16 (08/06 0734) BP: (96-110)/(59-76) 110/75 (08/06 0734) SpO2:  [92 %-96 %] 96 % (08/06 0830) Weight:  [77.1 kg] 77.1 kg (08/06 0604) Last BM Date : 08/21/22  Weight change: Filed Weights   08/20/22 0319 08/21/22 0504 08/22/22 0604  Weight: 77.4 kg 77.5 kg 77.1 kg    Intake/Output:   Intake/Output Summary (Last 24 hours) at 08/22/2022 1000 Last data filed at 08/22/2022 0500 Gross per 24 hour  Intake 250 ml  Output 2650 ml  Net -2400 ml    Physical Exam  General:  Appears older than stated age.  HEENT: normal Neck: supple. JVP 8-10. Carotids 2+ bilat; no bruits.  Cor: PMI nondisplaced. Regular rate & rhythm. No rubs, gallops or murmurs. Lungs: clear Abdomen: soft, nontender, nondistended.  Extremities: no cyanosis, clubbing, rash, edema, RUE PICC Neuro: alert & orientedx3. Affect pleasant   Telemetry   V paced 90s  Labs    CBC Recent Labs    08/20/22 0228  WBC 7.0  HGB 10.0*  HCT 31.3*  MCV 106.5*  PLT 183   Basic Metabolic Panel Recent Labs    16/10/96 0327 08/22/22 0500  NA 133* 133*  K 5.0 3.9  CL 99 97*  CO2 25 28  GLUCOSE 90 117*  BUN 21* 23*  CREATININE 0.88 1.09  CALCIUM 8.9 8.5*  MG 1.9 2.2    BNP: BNP (last 3 results) Recent Labs    07/11/22 0209 07/26/22 1211 08/10/22 1142  BNP 1,588.2* 1,723.6* 1,905.0*      Imaging    No results found.   Medications:     Scheduled Medications:  atorvastatin  80 mg Oral Daily   chlorhexidine  15 mL Mouth/Throat BID   Chlorhexidine Gluconate Cloth  6 each Topical Daily   dorzolamide-timolol  1 drop Both Eyes BID   feeding supplement  237 mL Oral BID BM   folic acid  1 mg Oral Daily   latanoprost  1 drop Both Eyes QHS   methotrexate  20 mg Oral Q Sat   mexiletine  250 mg Oral BID   mometasone-formoterol  2 puff Inhalation BID   multivitamin with minerals  1 tablet Oral Daily   pantoprazole  40 mg Oral QAC breakfast   umeclidinium bromide  1 puff Inhalation Daily    Infusions:   ceFAZolin (ANCEF) IV     milrinone 0.375 mcg/kg/min (08/22/22 0239)    PRN Medications: acetaminophen, albuterol, ondansetron (ZOFRAN) IV, mouth rinse, sodium chloride flush  R/LHC 07/25 Findings: Ao = 98/73 (79) LV = 101/39 RA = 19 RV = 76/21 PA = 80/42 (57) PCW = 47 (v = 55) Fick cardiac output/index = 2.7/1.4 PVR = 3.0 WU Ao sat = 96% PA sat = 41%, 39% PAPi = 2.0  Patient Profile  Charles Holmes is a 57 y.o.  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 Admitted with cardiogenic shock.   Assessment/Plan  1.  Acute on chronic Systolic HF-->Cardiogenic Shock  - Diagnosed 11/2019. Presented with VT. LHC 70% LAD  - cMRI 12/21 concerning for sarcoid and EF 18%.  - PET 2/22 at [facility] EF 25% + active sarcoid - Echo 08/26/20 EF < 20% severely dilated LV RV mildly decreased.  - CPX 8/22. Unable to complete PFTs due to hypoventilation and poor effort. pVO2 16.1 (49% predicted). Slope 19 RER: 1.17 PETCO2 59 - [employer] CRT-D upgrade in 06/08/21 - Echo 11/07/21: EF 20-25% RV mild to moderately reduced.  - Echo 07/10/22: EF <20%, RV okay, mod pericardial effusion, mod Charles/TR - Admitted 07/25 with cardiogenic shock. - RHC: Nonobstructive CAD, severely elevated filling pressures and low Fick CO/CI (2.7/1.4) - CO-OX  61% on milrinone 0.375 - CVP 8 this am. Just received 80 mg lasix IV prior to checking CVP. Will follow volume status. - GDMT limited by soft BP - Discussed in MRB 07/29. Likely not stable enough to discharge home before VAD. Scheduled for dental extractions today with [name], appreciate assistance. Plan for LVAD implant 08/13.    2.  Hx of stroke -Admitted 06/24 w/ R MCA stroke. S/p TPA and mechanical clot extraction. No residual deficits  -Likely cardioembolic in setting of severe LV dysfunction. 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   - Off eliquis  - Continue heparin drip until all procedures completed (currently on hold for extractions)  3. 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 Continue mexilitene  - now s/p ICD. No VT on interrogation.    4. CAD - LHC 12/07/19 70-% LAD, no intervention - LHC mild non obstructive CAD.  - Continue statin. Off aspirin.   5. Cardiac sarcoid - PET 2/22 at [facility] EF 25% + active sarcoid - Has completed prednisone.  - Takes MTX 20 weekly, Saturday. Continue folic acid - Previously discussed EM biopsy but patient is reluctant  - Needs repeat PET, wasn't able to schedule 10/23 as insurance would not cover. Now has medicaid and medicare. Plan to reschedule once discharged   6. 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 AKI - suspect cardiorenal, improved w/ inotropic support - Creatinine back to baseline ~1 - follow BMP    8. Iron deficiency anemia - recent T sat 15%, scheduled for OP feraheme. Will complete inpatient    9. Pulmonary  - PFTs with severe obstructive defect, response to bronchodilator - FEV1 1.04L, FEV1/FVC 48% - Consulted pulmonary to assist with pre VAD optimization. Appreciate assistance.    Length of Stay: 12  FINCH, LINDSAY N, PA-C  08/22/2022, 10:00 AM  Advanced Heart Failure Team Pager [phone] (M-F; 7a - 5p)  Please contact [facility] for  night-coverage after hours (5p -7a ) and weekends on [email or web address]   Patient seen and examined with the above-signed Advanced Practice Provider and/or Housestaff. I personally reviewed laboratory data, imaging studies and relevant notes. I independently examined the patient and formulated the important aspects of the plan. I have edited the note to reflect any of my changes or salient points. I have personally discussed the plan with the patient and/or family.  Diuresing well with IV lasix. Breathing better but still not back to baseline. Plan for dental extractions today. Heparin on hold.   On milrinone 0.375. Co-ox 61%  General:  Sitting up in bed . No resp difficulty HEENT: normal Neck: supple. JVP 8-9. Carotids 2+ bilat; no bruits. No lymphadenopathy or thryomegaly appreciated. Cor: Regular rate & rhythm. No rubs, gallops or murmurs. Lungs: clear Abdomen: soft, nontender, nondistended. No hepatosplenomegaly. No bruits or masses. Good bowel sounds. Extremities: no cyanosis, clubbing, rash, tr edema Neuro: alert & orientedx3, cranial nerves grossly intact. moves all 4 extremities w/o difficulty. Affect pleasant  Plan for teeth extraction today, Continue IV diuresis with milrinone support. Plan LVAD next week. D/w patient and his daughter at bedside.   Arvilla Meres, MD  2:30 PM

## 2022-08-23 ENCOUNTER — Encounter (HOSPITAL_COMMUNITY): Payer: Self-pay | Admitting: Oral Surgery

## 2022-08-23 DIAGNOSIS — J449 Chronic obstructive pulmonary disease, unspecified: Secondary | ICD-10-CM

## 2022-08-23 DIAGNOSIS — I5023 Acute on chronic systolic (congestive) heart failure: Secondary | ICD-10-CM | POA: Diagnosis not present

## 2022-08-23 DIAGNOSIS — Z7189 Other specified counseling: Secondary | ICD-10-CM | POA: Diagnosis not present

## 2022-08-23 DIAGNOSIS — Z515 Encounter for palliative care: Secondary | ICD-10-CM | POA: Diagnosis not present

## 2022-08-23 LAB — COOXEMETRY PANEL
Carboxyhemoglobin: 2.9 % — ABNORMAL HIGH (ref 0.5–1.5)
Methemoglobin: <0.7 % (ref 0.0–1.5)
O2 Saturation: 76.1 %
Total hemoglobin: 10.8 g/dL — ABNORMAL LOW (ref 12.0–16.0)

## 2022-08-23 MED ORDER — HEPARIN (PORCINE) 25000 UT/250ML-% IV SOLN
1200.0000 [IU]/h | INTRAVENOUS | Status: DC
  Administered 2022-08-23: 900 [IU]/h via INTRAVENOUS
  Administered 2022-08-25 – 2022-08-28 (×4): 1200 [IU]/h via INTRAVENOUS
  Filled 2022-08-23 (×7): qty 250

## 2022-08-23 MED ORDER — METOLAZONE 5 MG PO TABS
2.5000 mg | ORAL_TABLET | Freq: Once | ORAL | Status: AC
  Administered 2022-08-23: 2.5 mg via ORAL
  Filled 2022-08-23: qty 1

## 2022-08-23 MED ORDER — POTASSIUM CHLORIDE CRYS ER 20 MEQ PO TBCR
40.0000 meq | EXTENDED_RELEASE_TABLET | Freq: Once | ORAL | Status: AC
  Administered 2022-08-23: 40 meq via ORAL
  Filled 2022-08-23: qty 2

## 2022-08-23 MED ORDER — FUROSEMIDE 10 MG/ML IJ SOLN
80.0000 mg | Freq: Two times a day (BID) | INTRAMUSCULAR | Status: DC
Start: 2022-08-23 — End: 2022-08-23

## 2022-08-23 MED ORDER — ENSURE ENLIVE PO LIQD
237.0000 mL | Freq: Three times a day (TID) | ORAL | Status: DC
  Administered 2022-08-23 – 2022-08-28 (×12): 237 mL via ORAL

## 2022-08-23 MED ORDER — FUROSEMIDE 10 MG/ML IJ SOLN
80.0000 mg | Freq: Two times a day (BID) | INTRAMUSCULAR | Status: AC
  Administered 2022-08-23 (×2): 80 mg via INTRAVENOUS
  Filled 2022-08-23 (×2): qty 8

## 2022-08-23 NOTE — Progress Notes (Signed)
Nutrition Follow-up  DOCUMENTATION CODES:      INTERVENTION:  Downgrade to Dysphagia 2 Diet  -advance as tolerated  Ensure Plus High Protein po TID, each supplement provides 350 kcal and 20 grams of protein.  NUTRITION DIAGNOSIS:   Inadequate oral intake related to catabolic illness, poor appetite as evidenced by per patient/family report. -ongoing   GOAL:   Patient will meet greater than or equal to 90% of their needs -progressing   MONITOR:   PO intake, Supplement acceptance, Labs, Weight trends  REASON FOR ASSESSMENT:   Consult LVAD Eval  ASSESSMENT:   58 yo male admitted from cardiologist office with worsening fatigue, shortness of breath and poor appetite with acute on chronic HF with significant reduced LV dysfunction (EF 20%) and preserved RV function. Pt being evaluated for possible LVAD placement. PMH includes cardiac and pulmonary sarcoidosis, CAD, GERD, HLD, HTN. Noted recent hospitalization from 6/24-6/30 this year with acute emoblic CVA.  S/p 13 teeth extraction 8/6   LVAD next week   Diuresed yesterday, 3# down   Visited patient at bedside who is doing quite well since surgery yesterday. He requested an even softer diet since he does not think he would be able to chew chopped meats, which RD suspected. He was agreeable to increasing ONS to TID as well.   Labs: Na 133, Glu 176, BUN 26 Meds: lipitor, ensure BID, folvite, lasix, MVI, protonix, KCl  Wt: admit 1703, current 167#   PO: 50-100% intake at all meals   I/O's:  -8.1 L since admission    Diet Order:   Diet Order             DIET DYS 2 Room service appropriate? Yes; Fluid consistency: Thin  Diet effective now                   EDUCATION NEEDS:   Not appropriate for education at this time  Skin:  Skin Assessment: Reviewed RN Assessment  Last BM:  8/3 type 4  Height:   Ht Readings from Last 1 Encounters:  08/22/22 5\' 7"  (1.702 m)    Weight:   Wt Readings from Last 1  Encounters:  08/23/22 75.8 kg    Ideal Body Weight:     BMI:  Body mass index is 26.17 kg/m.  Estimated Nutritional Needs:   Kcal:  2000-2200 kcals  Protein:  100-115 g  Fluid:  1.8 L    Leodis Rains, RDN, LDN  Clinical Nutrition

## 2022-08-23 NOTE — Progress Notes (Signed)
PROGRESS NOTE    [name]  ZOX:[id number] DOB: 1965-05-29 DOA: 08/10/2022 PCP: Charles Peak, PA-C  Charles Holmes is a 57 y.o. 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 tach, cardiac sarcoidosis, paroxysmal A-fib presented to the ED with worsening dyspnea, fatigue X 12 days, in the ER he was volume overloaded, tachypneic with 3+ edema, chest x-ray noted cardiomegaly pulmonary vascular congestion bilateral infiltrates -Admitted, treated with diuretics -RHC 7/25 noted low cardiac output, high filling pressures, concern for cardiogenic shock, started on milrinone and nor epi -7/26 CT surgery consulted for possible LVAD -7/27 Lasix drip discontinued -Medically stable, awaiting LVAD -sp  dental extraction 8/6 per [name]   Subjective: -Completed dental extractions yesterday, had dyspnea on exertion overnight required Lasix  Assessment and Plan:  Acute on chronic systolic CHF, cardiogenic shock [name] Moderate pericardial effusion -Echo Ef <20%, severe dilatation of LV cavity, akinetic, hypokinetic entire anterior wall, entire lateral wall, anterior septum, entire inferior wall, and apex. RV systolic function preserved, moderate pericardial effusion, moderate mitral valve regurgitation, moderate TR.  -Cardiac MRI in 12/21 was concerning for sarcoidosis -RHC this admit with nonobstructive CAD, severely elevated filling pressures and low Fick -Currently on milrinone, CVP elevated, Lasix x 2 today -Heart failure team and T CTS following, VAD workup underway -13 teeth extractions completed per Dr. Barbette Holmes 8/6, appreciate assistance  CAD (coronary artery disease) Coronary angiography with proximal Cx 20 % stenosis, mid LAD 50% stenosis. Continue ASA/statin  HTN (hypertension) Continue milrinone.   Ventricular tachycardia (HCC) Patient on mexiletine.  Repleted mag  Cardiac sarcoidosis Continue  with methotrexate.   AKI (acute kidney injury) (HCC) Hypokalemia.  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  -Given IV iron  Severe COPD -stable, continue Dulera, as needed albuterol  DVT prophylaxis: Hep gtt Code Status: Full Code Family Communication: none present Disposition Plan:   Consultants:    Procedures:   Antimicrobials:    Objective: Vitals:   08/23/22 0639 08/23/22 0640 08/23/22 0746 08/23/22 1140  BP:   96/74 101/77  Pulse:  (!) 103 (!) 102 (!) 101  Resp:  19 17 18   Temp:   98.1 F (36.7 C) 97.7 F (36.5 C)  TempSrc:   Oral Oral  SpO2: 90% 96% 92% 96%  Weight:      Height:        Intake/Output Summary (Last 24 hours) at 08/23/2022 1206 Last data filed at 08/23/2022 0600 Gross per 24 hour  Intake 855.79 ml  Output 1410 ml  Net -554.21 ml   Filed Weights   08/22/22 0604 08/22/22 1222 08/23/22 0618  Weight: 77.1 kg 77.1 kg 75.8 kg    Examination:  Gen: Awake, Alert, Oriented X 3,  HEENT: no JVD Lungs: Good air movement bilaterally, CTAB CVS: S1S2/RRR Abd: soft, Non tender, non distended, BS present Extremities: trace edema, R arm picc line Skin: no new rashes on exposed skin     Data Reviewed:   CBC: Recent Labs  Lab 08/17/22 0227 08/18/22 0424 08/19/22 0113 08/20/22 0228  WBC 8.4 6.5 6.9 7.0  HGB 10.4* 10.2* 10.3* 10.0*  HCT 33.1* 31.9* 31.9* 31.3*  MCV 104.4* 107.8* 107.4* 106.5*  PLT 204 165 181 183  Basic Metabolic Panel: Recent Labs  Lab 08/20/22 0228 08/21/22 0327 08/22/22 0500 08/22/22 2055 08/23/22 0829  NA 134* 133* 133* 134* 133*  K 4.2 5.0 3.9 4.3 4.6  CL 101 99 97* 98 95*  CO2 25 25 28 26 26   GLUCOSE 103* 90 117* 171* 176*  BUN  24* 21* 23* 25* 26*  CREATININE 0.89 0.88 1.09 1.18 1.04  CALCIUM 8.7* 8.9 8.5* 8.8* 9.0  MG 1.9 1.9 2.2 2.0 2.0   GFR: Estimated Creatinine Clearance: 71.5 mL/min (by C-G formula based on SCr of 1.04 mg/dL). Liver Function Tests: No results for input(s): "AST", "ALT", "ALKPHOS", "BILITOT", "PROT", "ALBUMIN" in the last 168 hours.  No results for input(s): "LIPASE", "AMYLASE" in the last 168 hours. No results for input(s): "AMMONIA" in the last 168 hours. Coagulation Profile: No results for input(s): "INR", "PROTIME" in the last 168 hours.  Cardiac Enzymes: No results for input(s): "CKTOTAL", "CKMB", "CKMBINDEX", "TROPONINI" in the last 168 hours. BNP (last 3 results) No results for input(s): "PROBNP" in the last 8760 hours. HbA1C: No results for input(s): "HGBA1C" in the last 72 hours. CBG: Recent Labs  Lab 08/21/22 0527  GLUCAP 115*   Lipid Profile: No results for input(s): "CHOL", "HDL", "LDLCALC", "TRIG", "CHOLHDL", "LDLDIRECT" in the last 72 hours. Thyroid Function Tests: No results for input(s): "TSH", "T4TOTAL", "FREET4", "T3FREE", "THYROIDAB" in the last 72 hours. Anemia Panel: No results for input(s): "VITAMINB12", "FOLATE", "FERRITIN", "TIBC", "IRON", "RETICCTPCT" in the last 72 hours. Urine analysis:    Component Value Date/Time   COLORURINE STRAW (A) 08/11/2022 1328   APPEARANCEUR CLEAR 08/11/2022 1328   LABSPEC 1.006 08/11/2022 1328   PHURINE 7.0 08/11/2022 1328   GLUCOSEU 150 (A) 08/11/2022 1328   HGBUR NEGATIVE 08/11/2022 1328   BILIRUBINUR NEGATIVE 08/11/2022 1328   KETONESUR NEGATIVE 08/11/2022 1328   PROTEINUR NEGATIVE 08/11/2022 1328   NITRITE NEGATIVE 08/11/2022 1328   LEUKOCYTESUR NEGATIVE 08/11/2022 1328   Sepsis Labs: @LABRCNTIP (procalcitonin:4,lacticidven:4)  ) No results found for this or any previous visit (from the past 240 hour(s)).    Radiology Studies: No results found.   Scheduled Meds:  atorvastatin  80 mg Oral Daily    Chlorhexidine Gluconate Cloth  6 each Topical Daily   dorzolamide-timolol  1 drop Both Eyes BID   feeding supplement  237 mL Oral BID BM   folic acid  1 mg Oral Daily   furosemide  80 mg Intravenous BID   latanoprost  1 drop Both Eyes QHS   methotrexate  20 mg Oral Q Sat   mexiletine  250 mg Oral BID   mometasone-formoterol  2 puff Inhalation BID   multivitamin with minerals  1 tablet Oral Daily   pantoprazole  40 mg Oral QAC breakfast   umeclidinium bromide  1 puff Inhalation Daily   Continuous Infusions:  heparin     milrinone 0.375 mcg/kg/min (08/23/22 1143)     LOS: 13 days    Time spent:    [name], MD [facility]   08/23/2022, 12:06 PM

## 2022-08-23 NOTE — Progress Notes (Signed)
ANTICOAGULATION CONSULT NOTE - Follow Up  Pharmacy Consult for Heparin (Eliquis on hold) Indication: atrial fibrillation  Allergies  Allergen Reactions   Amiodarone     Severe tremors   Percocet [Oxycodone-Acetaminophen] Itching    Patient Measurements: Height: 5\' 7"  (170.2 cm) Weight: 75.8 kg (167 lb 1.6 oz) IBW/kg (Calculated) : 66.1 Heparin Dosing Weight: total weight  Vital Signs: Temp: 98.1 F (36.7 C) (08/07 0746) Temp Source: Oral (08/07 0746) BP: 96/74 (08/07 0746) Pulse Rate: 102 (08/07 0746)  Labs: Recent Labs    08/21/22 0327 08/22/22 0421 08/22/22 0500 08/22/22 2055 08/23/22 0829  HEPARINUNFRC 0.28* 0.33  --   --   --   CREATININE 0.88  --  1.09 1.18 1.04    Estimated Creatinine Clearance: 71.5 mL/min (by C-G formula based on SCr of 1.04 mg/dL).   Medical History: Past Medical History:  Diagnosis Date   CAD (coronary artery disease)    CHF (congestive heart failure) (HCC)    GERD (gastroesophageal reflux disease)    Hyperlipidemia    Hypertension    Systolic heart failure (HCC) 2021   LVEF 18%, RVEF 38% on cardiac MRI 12/19/2019. possible cardiac sarcoidosis.   Wide-complex tachycardia 2021   wears LifeVest    Medications:  Scheduled:   atorvastatin  80 mg Oral Daily   Chlorhexidine Gluconate Cloth  6 each Topical Daily   dorzolamide-timolol  1 drop Both Eyes BID   feeding supplement  237 mL Oral BID BM   folic acid  1 mg Oral Daily   furosemide  80 mg Intravenous BID   latanoprost  1 drop Both Eyes QHS   methotrexate  20 mg Oral Q Sat   metolazone  2.5 mg Oral Once   mexiletine  250 mg Oral BID   mometasone-formoterol  2 puff Inhalation BID   multivitamin with minerals  1 tablet Oral Daily   pantoprazole  40 mg Oral QAC breakfast   umeclidinium bromide  1 puff Inhalation Daily   Infusions:   milrinone 0.375 mcg/kg/min (08/23/22 0111)   PRN: acetaminophen, albuterol, diphenhydrAMINE, HYDROcodone-acetaminophen, ondansetron (ZOFRAN)  IV, mouth rinse, sodium chloride flush  Assessment: 57 yo male on apixaban for afib, recent CVA 6/24 felt to be cardioembolic. Presents for cath, work up for LVAD. Last dose of apixaban 7/25, heparin infusion started in the interim. Heparin held 8/6 am for dental extractions.   No oral bleeding noted this am, will start heparin drip back this afternoon. Will start low and titrate slowly (previously therapeutic at 1150 units/h).  Goal of Therapy:  Heparin level 0.3-0.5 units/ml Monitor platelets by anticoagulation protocol: Yes   Plan:  Start heparin 900 units/h no bolus at 1400 Check heparin level in 8h  Fredonia Highland, PharmD, [place], [facility] Clinical Pharmacist [phone] Please check AMION for all [facility] numbers 08/23/2022

## 2022-08-23 NOTE — Progress Notes (Signed)
CSW met at bedside with patient. He reports he had all his teeth extracted yesterday and overall doing well. CSW and patient discussed caregiver roles and will contact his daughter later today to introduce self. Patient appears to be in good spirits and ready for LVAD implantation. Lasandra Beech, LCSW, CCSW-MCS [phone]

## 2022-08-23 NOTE — Plan of Care (Signed)
  Problem: Activity: Goal: Ability to return to baseline activity level will improve Outcome: Progressing   Problem: Cardiovascular: Goal: Ability to achieve and maintain adequate cardiovascular perfusion will improve Outcome: Progressing   Problem: Health Behavior/Discharge Planning: Goal: Ability to safely manage health-related needs after discharge will improve Outcome: Progressing   Problem: Education: Goal: Knowledge of General Education information will improve Description: Including pain rating scale, medication(s)/side effects and non-pharmacologic comfort measures Outcome: Progressing   Problem: Health Behavior/Discharge Planning: Goal: Ability to manage health-related needs will improve Outcome: Progressing   Problem: Clinical Measurements: Goal: Ability to maintain clinical measurements within normal limits will improve Outcome: Progressing

## 2022-08-23 NOTE — Progress Notes (Signed)
Palliative: [name], [place], is resting quietly in bed.  He has had a busy morning and has lots of providers present including registered dietitian and bedside nursing staff.  Charles Holmes greets me making and mostly keeping eye contact.  He is alert and oriented, able to make his needs known.  There is no family at bedside at this time.  We talked about his acute health concerns.  He shares that overall he feels that he is doing quite well with teeth extraction.  He tells me that he had 13 teeth extracted yesterday.  He expresses relief that he has had less pain than expected.  Thankfully registered dietitian is present assisting with food texture choices.    Conference with attending, bedside nursing staff, palliative spiritual care, transition of care team related to patient condition, needs, goals of care, disposition.  Plan: At this point continue full scope/full code. Goal of LVAD sometime next week.  35 minutes  Lillia Carmel, NP Palliative medicine team Team phone [phone] Greater than 50% of this time was spent counseling and coordinating care related to the above assessment and plan.

## 2022-08-23 NOTE — Consult Note (Signed)
NAME:  Charles Holmes, MRN:  [id number], DOB:  Aug 15, 1965, LOS: 13 ADMISSION DATE:  08/10/2022, CONSULTATION DATE:  08/23/22  REFERRING MD:  TRH, CHIEF COMPLAINT:  SOB   History of Present Illness:  Charles Holmes is a 57 year old male with past medical history notable for acute embolic CVA, requiring tPA and thrombectomy at the right MCA, multivessel CAD, HFrEF with EF<20% due to sarcoidosis, paroxysmal V. tach.  Notably patient had recent admission from 07/10/2022 to 07/16/2022 for acute embolic CVA.  Patient had tPA and thrombectomy at that time (please see notes in EMR for full details).  Patient states for the last 12 days he developed worsening shortness of breath and generalized fatigue associated with generalized weakness.  Patient was instructed to increase his Lasix dose to twice daily instead of daily however had no significant improvement in his symptoms.  On day of admission he was evaluated by heart failure outpatient clinic and was found to have worsening heart failure and was referred to the emergency department for further evaluation.  Subsequently patient was placed on IV Lasix for diuresis and a right heart cath showed low cardiac output and high filling pressures consistent with cardiogenic shock.  Patient was started on inotropic support with milrinone and Levophed infusions.  Patient had a right heart cath which showed nonobstructive CAD severely elevated filling pressures and low Fick CO/CI (2.7/1.4).  Cardiology service is pursuing for VAD workup.  PCCM was consulted today 08/15/2022 for optimization of respiratory function   Pertinent  Medical History  HTN, GERD, systolic HF due to NICM, PAF, VT in setting of cardiac sarcoidosis, CVA  Significant Hospital Events: Including procedures, antibiotic start and stop dates in addition to other pertinent events   08/22/2018 for teeth extraction  Interim History / Subjective:  Required Lasix for shortness of breath overnight currently  on room air with sats of 94%  Objective   Blood pressure 96/74, pulse (!) 102, temperature 98.1 F (36.7 C), temperature source Oral, resp. rate 17, height 5\' 7"  (1.702 m), weight 75.8 kg, SpO2 92%. CVP:  [10 mmHg-32 mmHg] 32 mmHg      Intake/Output Summary (Last 24 hours) at 08/23/2022 1017 Last data filed at 08/23/2022 0600 Gross per 24 hour  Intake 855.79 ml  Output 1410 ml  Net -554.21 ml   Filed Weights   08/22/22 0604 08/22/22 1222 08/23/22 0618  Weight: 77.1 kg 77.1 kg 75.8 kg    Examination: 57 year old male who looks older than stated age in no acute distress at rest On room air with sats of 94% creased breath sounds throughout Heart sounds are distant, left radial A-line with blood pressure of 109/76 heart rate of 101 Abdomen soft nontender Extremities with edema Oropharynx recent teeth extraction noted  Resolved Hospital Problem list     Assessment & Plan:   Stage III COPD, not in acute exacerbation PFTs with severe obstructive disease, response to bronchodilator.  FVC pre 50%, , TLC pre 88%, FEV1 pre 31% FEV1 1.04L, FEV1/FVC 48%. Oxygen as needed Bronchodilators as needed Teeth removed on 08/22/2022 Required Lasix during the night for shortness of breath   Acute on Chronic systolic HF with cardiogenic shock Echocardiogram 07/10/2022 shows EF <20%, moderate pericardial effusion with moderate MR and TR. RHC showed nonobstructive CAD, severely elevated filling pressures with low Fick CO/CI (2.7/1.4). HF due to sarcoidosis. Heart failure team primary Per heart failure team   Hx VT Cardiac Sarcoid Paroxysmal AT/AF AKI Iron deficiency anemia Per heart failure team  Labs   CBC: Recent Labs  Lab 08/17/22 0227 08/18/22 0424 08/19/22 0113 08/20/22 0228  WBC 8.4 6.5 6.9 7.0  HGB 10.4* 10.2* 10.3* 10.0*  HCT 33.1* 31.9* 31.9* 31.3*  MCV 104.4* 107.8* 107.4* 106.5*  PLT 204 165 181 183    Basic Metabolic Panel: Recent Labs  Lab 08/19/22 0113  08/20/22 0228 08/21/22 0327 08/22/22 0500 08/22/22 2055  NA 135 134* 133* 133* 134*  K 4.7 4.2 5.0 3.9 4.3  CL 100 101 99 97* 98  CO2 28 25 25 28 26   GLUCOSE 98 103* 90 117* 171*  BUN 18 24* 21* 23* 25*  CREATININE 0.87 0.89 0.88 1.09 1.18  CALCIUM 8.9 8.7* 8.9 8.5* 8.8*  MG 2.0 1.9 1.9 2.2 2.0   GFR: Estimated Creatinine Clearance: 63 mL/min (by C-G formula based on SCr of 1.18 mg/dL). Recent Labs  Lab 08/17/22 0227 08/18/22 0424 08/19/22 0113 08/20/22 0228  WBC 8.4 6.5 6.9 7.0    Liver Function Tests: No results for input(s): "AST", "ALT", "ALKPHOS", "BILITOT", "PROT", "ALBUMIN" in the last 168 hours.  No results for input(s): "LIPASE", "AMYLASE" in the last 168 hours. No results for input(s): "AMMONIA" in the last 168 hours.  ABG    Component Value Date/Time   PHART 7.434 08/10/2022 1831   PCO2ART 39.2 08/10/2022 1831   PO2ART 79 (L) 08/10/2022 1831   HCO3 31.5 (H) 08/10/2022 1835   HCO3 34.0 (H) 08/10/2022 1835   TCO2 33 (H) 08/10/2022 1835   TCO2 36 (H) 08/10/2022 1835   O2SAT 98.7 08/23/2022 0555     Coagulation Profile: No results for input(s): "INR", "PROTIME" in the last 168 hours.   Cardiac Enzymes: No results for input(s): "CKTOTAL", "CKMB", "CKMBINDEX", "TROPONINI" in the last 168 hours.  HbA1C: Hgb A1c MFr Bld  Date/Time Value Ref Range Status  08/11/2022 03:30 PM 5.5 4.8 - 5.6 % Final    Comment:    (NOTE)         Prediabetes: 5.7 - 6.4         Diabetes: >6.4         Glycemic control for adults with diabetes: <7.0   07/11/2022 06:55 AM 5.1 4.8 - 5.6 % Final    Comment:    (NOTE) Pre diabetes:          5.7%-6.4%  Diabetes:              >6.4%  Glycemic control for   <7.0% adults with diabetes     CBG: Recent Labs  Lab 08/21/22 0527  GLUCAP 115*        Janeece Riggers, AGACNP-BC [facility] For pager details, please see AMION or use EPIC chat After 1900, please call ELINK for cross coverage  needs 08/23/2022 10:17 AM

## 2022-08-23 NOTE — Progress Notes (Addendum)
Advanced Heart Failure Rounding Note  PCP-Cardiologist: Norman Herrlich, MD   Subjective:    07/25: Admit with cardiogenic shock. Started milrinone and NE.  CO-OX 98%? on 0.375 milrinone.   CVP 13. Good diuresis with IV lasix, weight down 3lbs.   S/p 13 teeth extractions yesterday.   Feels fine this morning. Wants to keep working on getting his lungs stronger prior to LVAD next week. Using IS, reaching 1500, previously up to 1800. Activity limited by lines and foley, very motivated to get up and walk.   Objective:   Weight Range: 75.8 kg Body mass index is 26.17 kg/m.   Vital Signs:   Temp:  [98 F (36.7 C)-98.9 F (37.2 C)] 98.1 F (36.7 C) (08/07 0746) Pulse Rate:  [89-119] 102 (08/07 0746) Resp:  [7-25] 17 (08/07 0746) BP: (94-113)/(71-88) 96/74 (08/07 0746) SpO2:  [85 %-99 %] 92 % (08/07 0746) Arterial Line BP: (85-123)/(59-81) 123/69 (08/07 0640) Weight:  [75.8 kg-77.1 kg] 75.8 kg (08/07 0618) Last BM Date : 08/19/22  Weight change: Filed Weights   08/22/22 0604 08/22/22 1222 08/23/22 0618  Weight: 77.1 kg 77.1 kg 75.8 kg    Intake/Output:   Intake/Output Summary (Last 24 hours) at 08/23/2022 0902 Last data filed at 08/23/2022 0600 Gross per 24 hour  Intake 855.79 ml  Output 2210 ml  Net -1354.21 ml   CVP 13 Physical Exam  General:  well appearing.  No respiratory difficulty HEENT: normal Neck: supple. JVD ~14 cm. Carotids 2+ bilat; no bruits. No lymphadenopathy or thyromegaly appreciated. Cor: PMI nondisplaced. Regular rate & regular rhythm. No rubs, gallops or murmurs. Lungs: clear Abdomen: soft, nontender, nondistended. No hepatosplenomegaly. No bruits or masses. Good bowel sounds. Extremities: no cyanosis, clubbing, rash, trace edema LLE. PICC RUE. A line L wrist  Neuro: alert & oriented x 3, cranial nerves grossly intact. moves all 4 extremities w/o difficulty. Affect pleasant.   Telemetry   V paced 90s- low 100s (Personally reviewed)    Labs     CBC No results for input(s): "WBC", "NEUTROABS", "HGB", "HCT", "MCV", "PLT" in the last 72 hours.  Basic Metabolic Panel Recent Labs    19/14/78 0500 08/22/22 2055  NA 133* 134*  K 3.9 4.3  CL 97* 98  CO2 28 26  GLUCOSE 117* 171*  BUN 23* 25*  CREATININE 1.09 1.18  CALCIUM 8.5* 8.8*  MG 2.2 2.0    BNP: BNP (last 3 results) Recent Labs    07/11/22 0209 07/26/22 1211 08/10/22 1142  BNP 1,588.2* 1,723.6* 1,905.0*     Imaging    No results found.   Medications:     Scheduled Medications:  atorvastatin  80 mg Oral Daily   Chlorhexidine Gluconate Cloth  6 each Topical Daily   dorzolamide-timolol  1 drop Both Eyes BID   feeding supplement  237 mL Oral BID BM   folic acid  1 mg Oral Daily   latanoprost  1 drop Both Eyes QHS   methotrexate  20 mg Oral Q Sat   mexiletine  250 mg Oral BID   mometasone-formoterol  2 puff Inhalation BID   multivitamin with minerals  1 tablet Oral Daily   pantoprazole  40 mg Oral QAC breakfast   umeclidinium bromide  1 puff Inhalation Daily    Infusions:  milrinone 0.375 mcg/kg/min (08/23/22 0111)    PRN Medications: acetaminophen, albuterol, diphenhydrAMINE, HYDROcodone-acetaminophen, ondansetron (ZOFRAN) IV, mouth rinse, sodium chloride flush  R/LHC 07/25 Findings: Ao = 98/73 (79) LV =  101/39 RA = 19 RV = 76/21 PA = 80/42 (57) PCW = 47 (v = 55) Fick cardiac output/index = 2.7/1.4 PVR = 3.0 WU Ao sat = 96% PA sat = 41%, 39% PAPi = 2.0  Patient Profile  Mr Allton is a 57 y.o. 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 Admitted with cardiogenic shock.   Assessment/Plan  1.  Acute on chronic Systolic HF-->Cardiogenic Shock  - Diagnosed 11/2019. Presented with VT. LHC 70% LAD  - cMRI 12/21 concerning for sarcoid and EF 18%.  - PET 2/22 at [facility] EF 25% + active sarcoid - Echo 08/26/20 EF < 20% severely dilated LV RV mildly decreased.  - CPX 8/22. Unable to  complete PFTs due to hypoventilation and poor effort. pVO2 16.1 (49% predicted). Slope 19 RER: 1.17 PETCO2 59 - Medtr[employer] CRT-D upgrade in 06/08/21 - Echo 11/07/21: EF 20-25% RV mild to moderately reduced.  - Echo 07/10/22: EF <20%, RV okay, mod pericardial effusion, mod MR/TR - Admitted 07/25 with cardiogenic shock. - RHC: Nonobstructive CAD, severely elevated filling pressures and low Fick CO/CI (2.7/1.4) - CO-OX 98%? on milrinone 0.375. will repeat. - CVP 13 this am. Give 80 IV lasix BID today - GDMT limited by soft BP - Discussed in [facility] 07/29. Likely not stable enough to discharge home before VAD. Plan for LVAD implant 08/13.  - S/p 13 teeth extractions yesterday with [name]. Appreciate the assistance.    2.  Hx of stroke -Admitted 06/24 w/ R MCA stroke. S/p TPA and mechanical clot extraction. No residual deficits  -Likely cardioembolic in setting of severe LV dysfunction. 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   - Off eliquis  - restart hep gtt?  3. 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 Continue mexilitene  - now s/p ICD. No VT on interrogation.    4. CAD - LHC 12/07/19 70-% LAD, no intervention - LHC mild non obstructive CAD.  - Continue statin. Off aspirin.   5. Cardiac sarcoid - PET 2/22 at [facility] EF 25% + active sarcoid - Has completed prednisone.  - Takes MTX 20 weekly, Saturday. Continue folic acid - Previously discussed EM biopsy but patient is reluctant  - Needs repeat PET, wasn't able to schedule 10/23 as insurance would not cover. Now has medicaid and medicare. Plan to reschedule once discharged   6. 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 AKI - suspect cardiorenal, improved w/ inotropic support - Creatinine back to baseline ~1 - follow BMP    8. Iron deficiency anemia - recent T sat 15%, scheduled for OP feraheme. Will complete inpatient    9. Pulmonary  - PFTs with severe obstructive defect,  response to bronchodilator - FEV1 1.04L, FEV1/FVC 48% - Consulted pulmonary to assist with pre VAD optimization. Appreciate assistance.   Length of Stay: [address], NP  08/23/2022, 9:02 AM  Advanced Heart Failure Team Pager [phone] (M-F; 7a - 5p)  Please contact [facility] for night-coverage after hours (5p -7a ) and weekends on [email or web address]  Had teeth extracted yesterday.   Remains on milrinone. Had worsening SOB overnight and given more IV lasix. Eels a bit better this am but still SOB.   CVP 17 Co-ox pending   General:  Sitting up in bed HEENT: normal Neck: supple. JVP to ear . Carotids 2+ bilat; no bruits. No  lymphadenopathy or thryomegaly appreciated. Cor: Regular rate & rhythm. +s3 Lungs: clear Abdomen: soft, nontender, nondistended. No hepatosplenomegaly. No bruits or masses. Good bowel sounds. Extremities: no cyanosis, clubbing, rash, 1+ edema Neuro: alert & orientedx3, cranial nerves grossly intact. moves all 4 extremities w/o difficulty. Affect pleasant  Remains very tenuous. Continue milrinone. Increase lasix. Continue inpatient VAD w/u. Will need repeat RHC prior to going to OR for furhter optimization.   Arvilla Meres, MD  12:44 PM

## 2022-08-23 NOTE — Op Note (Signed)
Charles Holmes, Charles Holmes MEDICAL RECORD NO: [id number] ACCOUNT NO: [id number] DATE OF BIRTH: 16-May-1965 FACILITY: [facility] LOCATION: [id number] PHYSICIAN: Georgia Lopes, DDS  Operative Report   DATE OF PROCEDURE: 08/22/2022  PREOPERATIVE DIAGNOSIS:  Nonrestorable teeth secondary to dental caries numbers 2, 3, 8, 9, 11, 12, 15, 18, 21, 22, 27 and 29.  POSTOPERATIVE DIAGNOSIS:  Nonrestorable teeth secondary to dental caries numbers 2, 3, 8, 9, 11, 12, 15, 18, 21, 22, 27 and 29 and nonrestorable tooth fragment tooth #17.  PROCEDURE:  Extraction teeth numbers 2, 3, 8, 9, 11, 12, 15, 17, 18, 21, 22, 27 and 29.  SURGEON:  Georgia Lopes, DDS  ANESTHESIA:  General, nasal intubation, Dr. Sampson Goon attending.  DESCRIPTION OF PROCEDURE:  The patient was taken to the operating room and placed on the table in supine position.  General anesthesia was administered and nasal endotracheal tube was placed and secured.  The eyes were protected.  The patient was draped  for surgery.  Timeout was performed.  The posterior pharynx was suctioned and a throat pack was placed.  2% lidocaine 1:100,000 epinephrine was infiltrated in an inferior alveolar block on the right and left side of mandible and in buccal infiltration in  the anterior mandible around the teeth remaining in the maxilla.  The anesthesia was deposited buccally and palatally around the maxillary teeth to be removed.  A bite block was placed on the right side of the mouth, the left side was operated first.   Beginning with tooth number 15.  A 15 blade was used to make an incision around this tooth in the gingival sulcus and around teeth numbers 11, 12, 8 and 9.  The periosteum was reflected from around these teeth.  The teeth were elevated and removed with  the dental forceps.  The tissue was trimmed to allow for primary closure and the sockets were curetted.  Gelfoam sponges were placed in each of the sockets and then the area was closed with 3-0 chromic.   Then, the bite block was repositioned to the other  side of the mouth.  The right side was operated on the maxilla, teeth numbers 2 and 3 were removed using the #15 blade to make a sulcular incision around these teeth.  Tooth #2 was removed with the dental forceps.  Tooth #3 was removed and appeared to  be ankylosed to the maxillary bone as the bone fragments were removed when the tooth was extracted.  These bone fragments were removed from the tooth extracted from the mouth with the rongeur and saved to place in the extraction sockets of 2 and 3 to  help rebuild the bone.  The sockets were then curetted.  The bone was placed in the sockets and then Gelfoam was packed over the top of the bone fragments and the area was closed with 3-0 chromic.  Then, the right mandible was operated.  The 15 blade was  used to make an incision around teeth numbers 27 and 29.  The periosteum was reflected.  The teeth were removed with the dental forceps.  The sockets were curetted.  Gelfoam was placed and then they were closed with 3-0 chromic.  Then, the bite block  was repositioned to allow access to the left mandible.  The 15 blade was used to make an incision around teeth numbers 21 and 22 and around 18 and 17.  There was a root fragment of 17 that was not noticed on the preoperative Panorex, but this  was removed  using the rongeurs.  Tooth #18 was removed with the dental forceps as were teeth numbers 21 and 22.  The sockets were curetted and debrided. Tissue was trimmed to allow for closure and then Gelfoam sponges were placed after irrigating and these areas  were sutured with 3-0 chromic.  Then, the oral cavity was irrigated and suctioned.  The throat pack was removed.  The patient was left under care of anesthesia for extubation and transport to the floor.  SPECIMENS:  No specimens.  COUNTS:  Correct.   PUS D: 08/22/2022 2:31:32 pm T: 08/22/2022 2:59:00 pm  JOB: [id number]/ [id number]

## 2022-08-23 NOTE — Progress Notes (Addendum)
Mobility Specialist Progress Note:   08/23/22 1641  Mobility  Activity Ambulated with assistance in hallway  Level of Assistance Contact guard assist, steadying assist  Assistive Device None  Distance Ambulated (ft) 470 ft  RUE Weight Bearing NWB  Activity Response Tolerated well  Mobility Referral Yes  $Mobility charge 1 Mobility  Mobility Specialist Start Time (ACUTE ONLY) 1620  Mobility Specialist Stop Time (ACUTE ONLY) 1635  Mobility Specialist Time Calculation (min) (ACUTE ONLY) 15 min   Pre Mobility: 90% SpO2 RA During Mobility: 121-127 HR , 90%-94% SpO2 RA Post Mobility: 119 HR , 90% SpO2 RA  Pt received in bed, agreeable to mobility. Denied any feelings of pain or discomfort during ambulation, asymptomatic throughout. Pt returned to bed with call bell in reach and all needs met.   Charles Holmes  Mobility Specialist Please contact via [employer] office at [phone]

## 2022-08-24 ENCOUNTER — Inpatient Hospital Stay (HOSPITAL_COMMUNITY): Payer: Medicare Other

## 2022-08-24 DIAGNOSIS — I5023 Acute on chronic systolic (congestive) heart failure: Secondary | ICD-10-CM

## 2022-08-24 DIAGNOSIS — I509 Heart failure, unspecified: Secondary | ICD-10-CM

## 2022-08-24 LAB — ECHOCARDIOGRAM LIMITED
AV Mean grad: 3 mmHg
AV Peak grad: 5.5 mmHg
Ao pk vel: 1.17 m/s
Est EF: 15
Height: 67 in
MV M vel: 3.97 m/s
MV Peak grad: 63 mmHg
Radius: 0.6 cm
S' Lateral: 7.4 cm
Weight: 2620.79 oz

## 2022-08-24 LAB — HEPARIN LEVEL (UNFRACTIONATED)
Heparin Unfractionated: 0.12 IU/mL — ABNORMAL LOW (ref 0.30–0.70)
Heparin Unfractionated: 0.27 IU/mL — ABNORMAL LOW (ref 0.30–0.70)

## 2022-08-24 MED ORDER — MAGNESIUM SULFATE 2 GM/50ML IV SOLN
2.0000 g | Freq: Once | INTRAVENOUS | Status: AC
  Administered 2022-08-24: 2 g via INTRAVENOUS
  Filled 2022-08-24: qty 50

## 2022-08-24 MED ORDER — CHLORHEXIDINE GLUCONATE 0.12 % MT SOLN
15.0000 mL | Freq: Two times a day (BID) | OROMUCOSAL | Status: DC
  Filled 2022-08-24: qty 15

## 2022-08-24 MED ORDER — FUROSEMIDE 10 MG/ML IJ SOLN
80.0000 mg | Freq: Two times a day (BID) | INTRAMUSCULAR | Status: AC
  Administered 2022-08-24 (×2): 80 mg via INTRAVENOUS
  Filled 2022-08-24 (×2): qty 8

## 2022-08-24 MED ORDER — POTASSIUM CHLORIDE CRYS ER 20 MEQ PO TBCR
40.0000 meq | EXTENDED_RELEASE_TABLET | Freq: Once | ORAL | Status: AC
  Administered 2022-08-24: 40 meq via ORAL
  Filled 2022-08-24: qty 2

## 2022-08-24 MED ORDER — CHLORHEXIDINE GLUCONATE 0.12 % MT SOLN
15.0000 mL | Freq: Three times a day (TID) | OROMUCOSAL | Status: DC
  Administered 2022-08-24 – 2022-08-31 (×21): 15 mL via OROMUCOSAL
  Filled 2022-08-24 (×16): qty 15

## 2022-08-24 NOTE — Progress Notes (Signed)
ANTICOAGULATION CONSULT NOTE - Follow Up  Pharmacy Consult for Heparin (Eliquis on hold) Indication: atrial fibrillation  Allergies  Allergen Reactions   Amiodarone     Severe tremors   Percocet [Oxycodone-Acetaminophen] Itching    Patient Measurements: Height: 5\' 7"  (170.2 cm) Weight: 74.3 kg (163 lb 12.8 oz) IBW/kg (Calculated) : 66.1 Heparin Dosing Weight: total weight  Vital Signs: Temp: 98.5 F (36.9 C) (08/08 1309) Temp Source: Oral (08/08 1309) BP: 103/77 (08/08 1309) Pulse Rate: 102 (08/08 1309)  Labs: Recent Labs    08/22/22 2055 08/23/22 0829 08/24/22 0450 08/24/22 0633 08/24/22 1624  HGB  --   --  10.1*  --   --   HCT  --   --  31.9*  --   --   PLT  --   --  233  --   --   HEPARINUNFRC  --   --  >1.10* 0.12* 0.27*  CREATININE 1.18 1.04 1.04  --   --     Estimated Creatinine Clearance: 71.5 mL/min (by C-G formula based on SCr of 1.04 mg/dL).   Medical History: Past Medical History:  Diagnosis Date   CAD (coronary artery disease)    CHF (congestive heart failure) (HCC)    GERD (gastroesophageal reflux disease)    Hyperlipidemia    Hypertension    Systolic heart failure (HCC) 2021   LVEF 18%, RVEF 38% on cardiac MRI 12/19/2019. possible cardiac sarcoidosis.   Wide-complex tachycardia 2021   wears LifeVest    Medications:  Scheduled:   atorvastatin  80 mg Oral Daily   chlorhexidine  15 mL Mouth/Throat TID   Chlorhexidine Gluconate Cloth  6 each Topical Daily   dorzolamide-timolol  1 drop Both Eyes BID   feeding supplement  237 mL Oral TID BM   folic acid  1 mg Oral Daily   furosemide  80 mg Intravenous BID   latanoprost  1 drop Both Eyes QHS   methotrexate  20 mg Oral Q Sat   mexiletine  250 mg Oral BID   mometasone-formoterol  2 puff Inhalation BID   multivitamin with minerals  1 tablet Oral Daily   pantoprazole  40 mg Oral QAC breakfast   umeclidinium bromide  1 puff Inhalation Daily   Infusions:   heparin 1,050 Units/hr (08/24/22  1050)   milrinone 0.375 mcg/kg/min (08/24/22 1056)   PRN: acetaminophen, albuterol, diphenhydrAMINE, HYDROcodone-acetaminophen, ondansetron (ZOFRAN) IV, mouth rinse, sodium chloride flush  Assessment: 57 yo male on apixaban for afib, recent CVA 6/24 felt to be cardioembolic. Presents for cath, work up for LVAD. Last dose of apixaban 7/25, heparin infusion started in the interim. Heparin held 8/6 am for dental extractions.   Heparin resumed 8/7. Heparin level remains slightly subtherapeutic (0.27) on infusion at 1050 units/hr. No issues with line or bleeding reported per RN.  Goal of Therapy:  Heparin level 0.3-0.5 units/ml Monitor platelets by anticoagulation protocol: Yes   Plan:  Increase heparin to 1100 units/h - titrating slowly in setting of recent oral surgery Check heparin level in 6h  Christoper Fabian, PharmD, BCPS Please see amion for complete clinical pharmacist phone list 08/24/2022 5:07 PM

## 2022-08-24 NOTE — Progress Notes (Signed)
Mobility Specialist: Progress Note   08/24/22 1252  Mobility  Activity Ambulated with assistance in hallway  Level of Assistance Contact guard assist, steadying assist  Assistive Device None  Distance Ambulated (ft) 470 ft  RUE Weight Bearing NWB  Activity Response Tolerated well  Mobility Referral Yes  $Mobility charge 1 Mobility  Mobility Specialist Start Time (ACUTE ONLY) 1240  Mobility Specialist Stop Time (ACUTE ONLY) 1251  Mobility Specialist Time Calculation (min) (ACUTE ONLY) 11 min   Pre-Mobility: HR 110 During Mobility: HR 115, SpO2 94% Post-Mobility: HR 109  Pt was agreeable to mobility session - already ambulating in room when received. Denies any shortness of breath but was anxious about checking oxygen levels - VSS throughout. Ambulation limited d/t pt feeling a little more tired than this morning. Left on EOB with all needs met, call bell in reach.   Maurene Capes Mobility Specialist Please contact via [employer] or Rehab office at [phone]

## 2022-08-24 NOTE — Progress Notes (Addendum)
Advanced Heart Failure Rounding Note  PCP-Cardiologist: Norman Herrlich, MD   Subjective:    7/25: Admit with cardiogenic shock. Started milrinone and NE. 8/6 S/P 13 teeth extractions  8/7 Diuresed with IV lasix + metolazone.   Improved diuresis. Negative 2.4 liters. Weight down 4 pounds.   Remains on milrinone 0.375 mcg. CO-OX 70%.   Complaining of mouth pain. Denies SOB. Drinking more fluid. Having trouble eating.     Objective:   Weight Range: 74.3 kg Body mass index is 25.65 kg/m.   Vital Signs:   Temp:  [97.7 F (36.5 C)-98.3 F (36.8 C)] 98 F (36.7 C) (08/08 0334) Pulse Rate:  [98-103] 103 (08/07 1200) Resp:  [12-24] 16 (08/08 0334) BP: (96-101)/(67-77) 96/70 (08/08 0334) SpO2:  [90 %-96 %] 91 % (08/08 0334) Arterial Line BP: (69-109)/(57-61) 69/61 (08/07 1000) Weight:  [74.3 kg] 74.3 kg (08/08 0533) Last BM Date : 08/19/22  Weight change: Filed Weights   08/22/22 1222 08/23/22 0618 08/24/22 0533  Weight: 77.1 kg 75.8 kg 74.3 kg    Intake/Output:   Intake/Output Summary (Last 24 hours) at 08/24/2022 0839 Last data filed at 08/24/2022 0800 Gross per 24 hour  Intake 384.99 ml  Output 3350 ml  Net -2965.01 ml  CVP 15-16  Physical Exam  General:  Well appearing. No resp difficulty HEENT: normal Neck: supple. JVP to jaw. Carotids 2+ bilat; no bruits. No lymphadenopathy or thryomegaly appreciated. Cor: PMI nondisplaced. Regular rate & rhythm. No rubs, gallops or murmurs. Lungs: clear Abdomen: soft, nontender, nondistended. No hepatosplenomegaly. No bruits or masses. Good bowel sounds. Extremities: no cyanosis, clubbing, rash, edema. RUE PICC Neuro: alert & orientedx3, cranial nerves grossly intact. moves all 4 extremities w/o difficulty. Affect pleasant  Telemetry   V paced 90s   Labs    CBC Recent Labs    08/24/22 0450  WBC 9.8  HGB 10.1*  HCT 31.9*  MCV 104.2*  PLT 233    Basic Metabolic Panel Recent Labs    16/10/96 0829  08/24/22 0450  NA 133* 133*  K 4.6 4.0  CL 95* 89*  CO2 26 33*  GLUCOSE 176* 168*  BUN 26* 33*  CREATININE 1.04 1.04  CALCIUM 9.0 9.1  MG 2.0 1.9    BNP: BNP (last 3 results) Recent Labs    07/11/22 0209 07/26/22 1211 08/10/22 1142  BNP 1,588.2* 1,723.6* 1,905.0*     Imaging    No results found.   Medications:     Scheduled Medications:  atorvastatin  80 mg Oral Daily   Chlorhexidine Gluconate Cloth  6 each Topical Daily   dorzolamide-timolol  1 drop Both Eyes BID   feeding supplement  237 mL Oral TID BM   folic acid  1 mg Oral Daily   latanoprost  1 drop Both Eyes QHS   methotrexate  20 mg Oral Q Sat   mexiletine  250 mg Oral BID   mometasone-formoterol  2 puff Inhalation BID   multivitamin with minerals  1 tablet Oral Daily   pantoprazole  40 mg Oral QAC breakfast   umeclidinium bromide  1 puff Inhalation Daily    Infusions:  heparin 900 Units/hr (08/23/22 1356)   milrinone 0.375 mcg/kg/min (08/23/22 2328)    PRN Medications: acetaminophen, albuterol, diphenhydrAMINE, HYDROcodone-acetaminophen, ondansetron (ZOFRAN) IV, mouth rinse, sodium chloride flush  R/LHC 07/25 Findings: Ao = 98/73 (79) LV = 101/39 RA = 19 RV = 76/21 PA = 80/42 (57) PCW = 47 (v = 55) Fick cardiac  output/index = 2.7/1.4 PVR = 3.0 WU Ao sat = 96% PA sat = 41%, 39% PAPi = 2.0  Patient Profile  Mr Rothberg is a 57 y.o. 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 Admitted with cardiogenic shock.   Assessment/Plan  1.  Acute on chronic Systolic HF-->Cardiogenic Shock  - Diagnosed 11/2019. Presented with VT. LHC 70% LAD  - cMRI 12/21 concerning for sarcoid and EF 18%.  - PET 2/22 at [facility] EF 25% + active sarcoid - Echo 08/26/20 EF < 20% severely dilated LV RV mildly decreased.  - CPX 8/22. Unable to complete PFTs due to hypoventilation and poor effort. pVO2 16.1 (49% predicted). Slope 19 RER: 1.17 PETCO2 59 - [employer]  CRT-D upgrade in 06/08/21 - Echo 11/07/21: EF 20-25% RV mild to moderately reduced.  - Echo 07/10/22: EF <20%, RV okay, mod pericardial effusion, mod MR/TR - Admitted 07/25 with cardiogenic shock. - RHC: Nonobstructive CAD, severely elevated filling pressures and low Fick CO/CI (2.7/1.4) - CO-OX 70% on milrinone 0.375.  - CVP 15-16. Suspect this is due to increased fluid intake. Continue lasix 80 mg twice a day and give dose of metolazone.  Renal function stable.  - GDMT limited by soft BP - Discussed in [facility] 07/29. Likely not stable enough to discharge home before VAD. Plan for LVAD implant 08/13.  - S/P 13 teeth extractions yesterday with [name]. Appreciate the assistance.    2.  Hx of stroke -Admitted 06/24 w/ R MCA stroke. S/p TPA and mechanical clot extraction. No residual deficits  -Likely cardioembolic in setting of severe LV dysfunction. 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   - Off eliquis  - On Heparin drip.   3. 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 Continue mexilitene  - now s/p ICD. No VT on interrogation.    4. CAD - LHC 12/07/19 70-% LAD, no intervention - LHC mild non obstructive CAD.  - No chest pain.  - Continue statin. Off aspirin.   5. Cardiac sarcoid - PET 2/22 at [facility] EF 25% + active sarcoid - Has completed prednisone.  - Takes MTX 20 weekly, Saturday. Continue folic acid - Previously discussed EM biopsy but patient is reluctant  - Needs repeat PET, wasn't able to schedule 10/23 as insurance would not cover. Now has medicaid and medicare. Plan to reschedule once discharged   6. 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 AKI - suspect cardiorenal, improved w/ inotropic support - Creatinine back to baseline ~1 - follow BMP    8. Iron deficiency anemia - recent T sat 15%, scheduled for OP feraheme. Will complete inpatient    9. Pulmonary  - PFTs with severe obstructive defect, response  to bronchodilator - FEV1 1.04L, FEV1/FVC 48% - Consulted pulmonary to assist with pre VAD optimization. Appreciate assistance.   Length of Stay: 14  Tonye Becket, NP  08/24/2022, 8:39 AM  Advanced Heart Failure Team Pager [phone] (M-F; 7a - 5p)  Please contact [facility] for night-coverage after hours (5p -7a ) and weekends on [email or web address]  Patient seen and examined with the above-signed Advanced Practice Provider and/or Housestaff. I personally reviewed laboratory data, imaging studies and relevant notes. I independently examined the patient and formulated the important aspects of the plan. I have edited the note to reflect any of my changes or salient points. I have personally  discussed the plan with the patient and/or family.  Remains on milrinone co-ox ok. Diuresed briskly and feels better but CVP still 15.  General:  Lying in bed No resp difficulty HEENT: normal Neck: supple. JVP to jaw . Carotids 2+ bilat; no bruits. No lymphadenopathy or thryomegaly appreciated. Cor: PMI nondisplaced. Regular rate & rhythm. +s3 Lungs: clear Abdomen: soft, nontender, nondistended. No hepatosplenomegaly. No bruits or masses. Good bowel sounds. Extremities: no cyanosis, clubbing, rash, edema Neuro: alert & orientedx3, cranial nerves grossly intact. moves all 4 extremities w/o difficulty. Affect pleasant  Clinically appears improved on milrinone and diuresis but CVP still high. Continue IV lasix. Repeat limited echo to look at RV function. Tentative LVAD next week.   Arvilla Meres, MD  11:32 AM

## 2022-08-24 NOTE — Progress Notes (Signed)
This chaplain responded to PMT NP-Tasha consult for creating/updating the Pt. HCPOA.  The Pt. is awake and accepting of the chaplain's visit. The Pt. RN-Nego is at the bedside during Pt. AD education.  The chaplain understands the Pt. is naming [name] as his healthcare agent. The Pt. completed document is in the shadow chart waiting for a notary visit on Friday. The Pt. is not completing a Living Will. The chaplain understands the Pt. and Pt. daughter-Kayla are having ongoing conversations about the Pt. medical decisions.  The chaplain listened reflectively as the Pt. described the support of his family and the love he hopes to continue to share with his grandchildren. The chaplain understands the Pt. is hopeful the LVAD procedure will add to his quality of life. The Pt. smiles as he shares his hope to work again in [employer], [employer], but he is accepting this goal may not be possible.  The Pt. speaks freely of his faith in God and God's love. The Pt. accepts the chaplain's invitation for F/U spiritual care.  Chaplain Stephanie Acre [phone]

## 2022-08-24 NOTE — Progress Notes (Signed)
PROGRESS NOTE    Charles Holmes  QMV:[id number] DOB: 1965/06/30 DOA: 08/10/2022 PCP: [name], PA-C  Charles Holmes is a 57 y.o. 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 tach, cardiac sarcoidosis, paroxysmal A-fib presented to the ED with worsening dyspnea, fatigue X 12 days, in the ER he was volume overloaded, tachypneic with 3+ edema, chest x-ray noted cardiomegaly pulmonary vascular congestion bilateral infiltrates -Admitted, treated with diuretics -RHC 7/25 noted low cardiac output, high filling pressures, concern for cardiogenic shock, started on milrinone and nor epi -7/26 CT surgery consulted for possible LVAD -7/27 Lasix drip discontinued -Medically stable, awaiting LVAD -8/6 -13 teeth extractions  per [name]   Subjective: -Some soreness in his jaw today, tolerating soft diet  Assessment and Plan:  Acute on chronic systolic CHF, cardiogenic shock [name] Moderate pericardial effusion -Echo Ef <20%, severe dilatation of LV cavity, akinetic, hypokinetic entire anterior wall, entire lateral wall, anterior septum, entire inferior wall, and apex. RV systolic function preserved, moderate pericardial effusion, moderate mitral valve regurgitation, moderate TR.  -Cardiac MRI in 12/21 was concerning for sarcoidosis -RHC this admit with nonobstructive CAD, severely elevated filling pressures and low Fick -Currently on milrinone, CVP elevated, will repeat Lasix today -Heart failure team and T CTS following, VAD workup underway -13 teeth extractions completed per [name] 8/6, appreciate assistance  CAD (coronary artery disease) Coronary angiography with proximal Cx 20 % stenosis, mid LAD 50% stenosis. Continue ASA/statin  HTN (hypertension) Continue milrinone.   Ventricular tachycardia (HCC) Patient on mexiletine.  Repleted mag  Cardiac sarcoidosis Continue with methotrexate.   AKI  (acute kidney injury) (HCC) Hypokalemia.  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  -Given IV iron  Severe COPD -stable, continue Dulera, as needed albuterol  DVT prophylaxis: Hep gtt Code Status: Full Code Family Communication: none present Disposition Plan:   Consultants:    Procedures:   Antimicrobials:    Objective: Vitals:   08/24/22 0334 08/24/22 0533 08/24/22 0854 08/24/22 0907  BP: 96/70   103/68  Pulse:    98  Resp: 16   20  Temp: 98 F (36.7 C)   97.7 F (36.5 C)  TempSrc: Oral   Oral  SpO2: 91%  97% 94%  Weight:  74.3 kg    Height:        Intake/Output Summary (Last 24 hours) at 08/24/2022 1022 Last data filed at 08/24/2022 0900 Gross per 24 hour  Intake 624.99 ml  Output 3500 ml  Net -2875.01 ml   Filed Weights   08/22/22 1222 08/23/22 0618 08/24/22 0533  Weight: 77.1 kg 75.8 kg 74.3 kg    Examination:  Gen: Awake, Alert, Oriented X 3,  HEENT: + JVD Lungs: Good air movement bilaterally, CTAB CVS: S1S2/RRR Abd: soft, Non tender, non distended, BS present Extremities: No edema Skin: no new rashes on exposed skin  Extremities: trace edema, R arm picc line Skin: no new rashes on exposed skin     Data Reviewed:   CBC: Recent Labs  Lab 08/18/22 0424 08/19/22 0113 08/20/22 0228 08/24/22 0450  WBC 6.5 6.9 7.0 9.8  HGB 10.2* 10.3* 10.0* 10.1*  HCT 31.9* 31.9* 31.3* 31.9*  MCV 107.8* 107.4* 106.5* 104.2*  PLT 165 181 183 233   Basic Metabolic Panel: Recent Labs  Lab 08/21/22 0327 08/22/22 0500 08/22/22 2055 08/23/22 0829 08/24/22 0450  NA 133* 133* 134* 133* 133*  K 5.0 3.9 4.3 4.6 4.0  CL 99 97* 98 95* 89*  CO2 25 28 26 26  33*  GLUCOSE 90 117* 171*  176* 168*  BUN 21* 23* 25* 26* 33*  CREATININE 0.88 1.09 1.18 1.04 1.04  CALCIUM 8.9 8.5* 8.8* 9.0 9.1  MG 1.9 2.2 2.0 2.0 1.9   GFR: Estimated Creatinine Clearance: 71.5 mL/min (by C-G formula based on SCr of 1.04 mg/dL). Liver Function Tests: No results for input(s): "AST", "ALT", "ALKPHOS", "BILITOT", "PROT", "ALBUMIN" in the last 168 hours.  No results for input(s): "LIPASE", "AMYLASE" in the last 168 hours. No results for input(s): "AMMONIA" in the last 168 hours. Coagulation Profile: No results for input(s): "INR", "PROTIME" in the last 168 hours.  Cardiac Enzymes: No results for input(s): "CKTOTAL", "CKMB", "CKMBINDEX", "TROPONINI" in the last 168 hours. BNP (last 3 results) No results for input(s): "PROBNP" in the last 8760 hours. HbA1C: No results for input(s): "HGBA1C" in the last 72 hours. CBG: Recent Labs  Lab 08/21/22 0527  GLUCAP 115*   Lipid Profile: No results for input(s): "CHOL", "HDL", "LDLCALC", "TRIG", "CHOLHDL", "LDLDIRECT" in the last 72 hours. Thyroid Function Tests: No results for input(s): "TSH", "T4TOTAL", "FREET4", "T3FREE", "THYROIDAB" in the last 72 hours. Anemia Panel: No results for input(s): "VITAMINB12", "FOLATE", "FERRITIN", "TIBC", "IRON", "RETICCTPCT" in the last 72 hours. Urine analysis:    Component Value Date/Time   COLORURINE STRAW (A) 08/11/2022 1328   APPEARANCEUR CLEAR 08/11/2022 1328   LABSPEC 1.006 08/11/2022 1328   PHURINE 7.0 08/11/2022 1328   GLUCOSEU 150 (A) 08/11/2022 1328   HGBUR NEGATIVE 08/11/2022 1328   BILIRUBINUR NEGATIVE 08/11/2022 1328   KETONESUR NEGATIVE 08/11/2022 1328   PROTEINUR NEGATIVE 08/11/2022 1328   NITRITE NEGATIVE 08/11/2022 1328   LEUKOCYTESUR NEGATIVE 08/11/2022 1328   Sepsis Labs: @LABRCNTIP (procalcitonin:4,lacticidven:4)  ) No results found for this or any previous visit (from the past 240 hour(s)).    Radiology Studies: No results found.   Scheduled Meds:  atorvastatin  80 mg Oral  Daily   chlorhexidine  15 mL Mouth/Throat TID   Chlorhexidine Gluconate Cloth  6 each Topical Daily   dorzolamide-timolol  1 drop Both Eyes BID   feeding supplement  237 mL Oral TID BM   folic acid  1 mg Oral Daily   furosemide  80 mg Intravenous BID   latanoprost  1 drop Both Eyes QHS   methotrexate  20 mg Oral Q Sat   mexiletine  250 mg Oral BID   mometasone-formoterol  2 puff Inhalation BID   multivitamin with minerals  1 tablet Oral Daily   pantoprazole  40 mg Oral QAC breakfast   potassium chloride  40 mEq Oral Once   umeclidinium bromide  1 puff Inhalation Daily   Continuous Infusions:  heparin 900 Units/hr (08/23/22 1356)   magnesium sulfate bolus IVPB     milrinone 0.375 mcg/kg/min (08/23/22 2328)     LOS: 14 days    Time spent:    [name], MD [facility]   08/24/2022, 10:22 AM

## 2022-08-24 NOTE — Progress Notes (Signed)
ANTICOAGULATION CONSULT NOTE - Follow Up  Pharmacy Consult for Heparin (Eliquis on hold) Indication: atrial fibrillation  Allergies  Allergen Reactions   Amiodarone     Severe tremors   Percocet [Oxycodone-Acetaminophen] Itching    Patient Measurements: Height: 5\' 7"  (170.2 cm) Weight: 74.3 kg (163 lb 12.8 oz) IBW/kg (Calculated) : 66.1 Heparin Dosing Weight: total weight  Vital Signs: Temp: 98 F (36.7 C) (08/08 0334) Temp Source: Oral (08/08 0334) BP: 96/70 (08/08 0334)  Labs: Recent Labs    08/22/22 0421 08/22/22 0500 08/22/22 2055 08/23/22 0829 08/24/22 0450 08/24/22 0633  HGB  --   --   --   --  10.1*  --   HCT  --   --   --   --  31.9*  --   PLT  --   --   --   --  233  --   HEPARINUNFRC 0.33  --   --   --  >1.10* 0.12*  CREATININE  --    < > 1.18 1.04 1.04  --    < > = values in this interval not displayed.    Estimated Creatinine Clearance: 71.5 mL/min (by C-G formula based on SCr of 1.04 mg/dL).   Medical History: Past Medical History:  Diagnosis Date   CAD (coronary artery disease)    CHF (congestive heart failure) (HCC)    GERD (gastroesophageal reflux disease)    Hyperlipidemia    Hypertension    Systolic heart failure (HCC) 2021   LVEF 18%, RVEF 38% on cardiac MRI 12/19/2019. possible cardiac sarcoidosis.   Wide-complex tachycardia 2021   wears LifeVest    Medications:  Scheduled:   atorvastatin  80 mg Oral Daily   Chlorhexidine Gluconate Cloth  6 each Topical Daily   dorzolamide-timolol  1 drop Both Eyes BID   feeding supplement  237 mL Oral TID BM   folic acid  1 mg Oral Daily   latanoprost  1 drop Both Eyes QHS   methotrexate  20 mg Oral Q Sat   mexiletine  250 mg Oral BID   mometasone-formoterol  2 puff Inhalation BID   multivitamin with minerals  1 tablet Oral Daily   pantoprazole  40 mg Oral QAC breakfast   umeclidinium bromide  1 puff Inhalation Daily   Infusions:   heparin 900 Units/hr (08/23/22 1356)   magnesium sulfate  bolus IVPB     milrinone 0.375 mcg/kg/min (08/23/22 2328)   PRN: acetaminophen, albuterol, diphenhydrAMINE, HYDROcodone-acetaminophen, ondansetron (ZOFRAN) IV, mouth rinse, sodium chloride flush  Assessment: 57 yo male on apixaban for afib, recent CVA 6/24 felt to be cardioembolic. Presents for cath, work up for LVAD. Last dose of apixaban 7/25, heparin infusion started in the interim. Heparin held 8/6 am for dental extractions.   Heparin resumed 8/7. CBC stable, no oral bleeding. Heparin level subtherapeutic at 0.12  Goal of Therapy:  Heparin level 0.3-0.5 units/ml Monitor platelets by anticoagulation protocol: Yes   Plan:  Increase heparin to 1050 units/h - titrating slowly in setting of recent oral surgery Check heparin level in 8h  Fredonia Highland, PharmD, [place], [facility] Clinical Pharmacist [phone] Please check AMION for all [facility] Pharmacy numbers 08/24/2022

## 2022-08-24 NOTE — Progress Notes (Addendum)
2 Days Post-Op Procedure(s) (LRB): DENTAL RESTORATION/EXTRACTIONS (N/A) Subjective: Patient is recovering from multiple dental extractions 48 hours ago.  He is drinking Ensure protein shakes and trying to maintain nutrition. The patient is ambulating and using incentive spirometry.  HeartMate 3 implantation for severe cardiomyopathy is scheduled in 5 days.  Patient scheduled for preop echocardiogram today-last echo was June 2024.  Coox today was 70% on milrinone 0.375 Objective: Vital signs in last 24 hours: Temp:  [97.7 F (36.5 C)-98.3 F (36.8 C)] 97.7 F (36.5 C) (08/08 0907) Pulse Rate:  [98-103] 98 (08/08 0907) Cardiac Rhythm: Ventricular paced (08/08 0815) Resp:  [13-24] 21 (08/08 1035) BP: (96-103)/(67-73) 103/68 (08/08 0907) SpO2:  [91 %-97 %] 94 % (08/08 0907) Weight:  [74.3 kg] 74.3 kg (08/08 0533)  Hemodynamic parameters for last 24 hours: CVP:  [8 mmHg-15 mmHg] 15 mmHg  Intake/Output from previous day: 08/07 0701 - 08/08 0700 In: 385 [P.O.:240; I.V.:145] Out: 2800 [Urine:2800] Intake/Output this shift: Total I/O In: 240 [P.O.:240] Out: 700 [Urine:700]       Exam    General- alert and comfortable.  His extraction sites appear to be clean and closed with chromic suture.    Neck- no JVD, no cervical adenopathy palpable, no carotid bruit   Lungs- clear with scattered rhonchi   Cor- regular rate and rhythm, no murmur , gallop   Abdomen- soft, non-tender   Extremities - warm, non-tender, minimal edema   Neuro- oriented, appropriate, no focal weakness   Lab Results: Recent Labs    08/24/22 0450  WBC 9.8  HGB 10.1*  HCT 31.9*  PLT 233   BMET:  Recent Labs    08/23/22 0829 08/24/22 0450  NA 133* 133*  K 4.6 4.0  CL 95* 89*  CO2 26 33*  GLUCOSE 176* 168*  BUN 26* 33*  CREATININE 1.04 1.04  CALCIUM 9.0 9.1    PT/INR: No results for input(s): "LABPROT", "INR" in the last 72 hours. ABG    Component Value Date/Time   PHART 7.434 08/10/2022 1831    HCO3 31.5 (H) 08/10/2022 1835   HCO3 34.0 (H) 08/10/2022 1835   TCO2 33 (H) 08/10/2022 1835   TCO2 36 (H) 08/10/2022 1835   O2SAT 69.9 08/24/2022 0450   CBG (last 3)  No results for input(s): "GLUCAP" in the last 72 hours.  Assessment/Plan: S/P Procedure(s) (LRB): DENTAL RESTORATION/EXTRACTIONS (N/A) Continue Peridex mouth rinse, protein shakes, and incentive spirometry with scheduled inhalant bronchodilator therapy. Continue milrinone therapy and daily coox. Repeat right heart cath prior to HeartMate 3 implantation. Chest x-ray ordered for tomorrow.  LOS: 14 days    Lovett Sox 08/24/2022

## 2022-08-24 NOTE — H&P (View-Only) (Signed)
 Advanced Heart Failure Rounding Note  PCP-Cardiologist: Norman Herrlich, MD   Subjective:    7/25: Admit with cardiogenic shock. Started milrinone and NE. 8/6 S/P 13 teeth extractions  8/7 Diuresed with IV lasix + metolazone.   Improved diuresis. Negative 2.4 liters. Weight down 4 pounds.   Remains on milrinone 0.375 mcg. CO-OX 70%.   Complaining of mouth pain. Denies SOB. Drinking more fluid. Having trouble eating.     Objective:   Weight Range: 74.3 kg Body mass index is 25.65 kg/m.   Vital Signs:   Temp:  [97.7 F (36.5 C)-98.3 F (36.8 C)] 98 F (36.7 C) (08/08 0334) Pulse Rate:  [98-103] 103 (08/07 1200) Resp:  [12-24] 16 (08/08 0334) BP: (96-101)/(67-77) 96/70 (08/08 0334) SpO2:  [90 %-96 %] 91 % (08/08 0334) Arterial Line BP: (69-109)/(57-61) 69/61 (08/07 1000) Weight:  [74.3 kg] 74.3 kg (08/08 0533) Last BM Date : 08/19/22  Weight change: Filed Weights   08/22/22 1222 08/23/22 0618 08/24/22 0533  Weight: 77.1 kg 75.8 kg 74.3 kg    Intake/Output:   Intake/Output Summary (Last 24 hours) at 08/24/2022 0839 Last data filed at 08/24/2022 0800 Gross per 24 hour  Intake 384.99 ml  Output 3350 ml  Net -2965.01 ml  CVP 15-16  Physical Exam  General:  Well appearing. No resp difficulty HEENT: normal Neck: supple. JVP to jaw. Carotids 2+ bilat; no bruits. No lymphadenopathy or thryomegaly appreciated. Cor: PMI nondisplaced. Regular rate & rhythm. No rubs, gallops or murmurs. Lungs: clear Abdomen: soft, nontender, nondistended. No hepatosplenomegaly. No bruits or masses. Good bowel sounds. Extremities: no cyanosis, clubbing, rash, edema. RUE PICC Neuro: alert & orientedx3, cranial nerves grossly intact. moves all 4 extremities w/o difficulty. Affect pleasant  Telemetry   V paced 90s   Labs    CBC Recent Labs    08/24/22 0450  WBC 9.8  HGB 10.1*  HCT 31.9*  MCV 104.2*  PLT 233    Basic Metabolic Panel Recent Labs    16/10/96 0829  08/24/22 0450  NA 133* 133*  K 4.6 4.0  CL 95* 89*  CO2 26 33*  GLUCOSE 176* 168*  BUN 26* 33*  CREATININE 1.04 1.04  CALCIUM 9.0 9.1  MG 2.0 1.9    BNP: BNP (last 3 results) Recent Labs    07/11/22 0209 07/26/22 1211 08/10/22 1142  BNP 1,588.2* 1,723.6* 1,905.0*     Imaging    No results found.   Medications:     Scheduled Medications:  atorvastatin  80 mg Oral Daily   Chlorhexidine Gluconate Cloth  6 each Topical Daily   dorzolamide-timolol  1 drop Both Eyes BID   feeding supplement  237 mL Oral TID BM   folic acid  1 mg Oral Daily   latanoprost  1 drop Both Eyes QHS   methotrexate  20 mg Oral Q Sat   mexiletine  250 mg Oral BID   mometasone-formoterol  2 puff Inhalation BID   multivitamin with minerals  1 tablet Oral Daily   pantoprazole  40 mg Oral QAC breakfast   umeclidinium bromide  1 puff Inhalation Daily    Infusions:  heparin 900 Units/hr (08/23/22 1356)   milrinone 0.375 mcg/kg/min (08/23/22 2328)    PRN Medications: acetaminophen, albuterol, diphenhydrAMINE, HYDROcodone-acetaminophen, ondansetron (ZOFRAN) IV, mouth rinse, sodium chloride flush  R/LHC 07/25 Findings: Ao = 98/73 (79) LV = 101/39 RA = 19 RV = 76/21 PA = 80/42 (57) PCW = 47 (v = 55) Fick cardiac  output/index = 2.7/1.4 PVR = 3.0 WU Ao sat = 96% PA sat = 41%, 39% PAPi = 2.0  Patient Profile  Mr Rothberg is a 57 y.o. 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 Admitted with cardiogenic shock.   Assessment/Plan  1.  Acute on chronic Systolic HF-->Cardiogenic Shock  - Diagnosed 11/2019. Presented with VT. LHC 70% LAD  - cMRI 12/21 concerning for sarcoid and EF 18%.  - PET 2/22 at [facility] EF 25% + active sarcoid - Echo 08/26/20 EF < 20% severely dilated LV RV mildly decreased.  - CPX 8/22. Unable to complete PFTs due to hypoventilation and poor effort. pVO2 16.1 (49% predicted). Slope 19 RER: 1.17 PETCO2 59 - [employer]  CRT-D upgrade in 06/08/21 - Echo 11/07/21: EF 20-25% RV mild to moderately reduced.  - Echo 07/10/22: EF <20%, RV okay, mod pericardial effusion, mod MR/TR - Admitted 07/25 with cardiogenic shock. - RHC: Nonobstructive CAD, severely elevated filling pressures and low Fick CO/CI (2.7/1.4) - CO-OX 70% on milrinone 0.375.  - CVP 15-16. Suspect this is due to increased fluid intake. Continue lasix 80 mg twice a day and give dose of metolazone.  Renal function stable.  - GDMT limited by soft BP - Discussed in [facility] 07/29. Likely not stable enough to discharge home before VAD. Plan for LVAD implant 08/13.  - S/P 13 teeth extractions yesterday with [name]. Appreciate the assistance.    2.  Hx of stroke -Admitted 06/24 w/ R MCA stroke. S/p TPA and mechanical clot extraction. No residual deficits  -Likely cardioembolic in setting of severe LV dysfunction. 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   - Off eliquis  - On Heparin drip.   3. 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 Continue mexilitene  - now s/p ICD. No VT on interrogation.    4. CAD - LHC 12/07/19 70-% LAD, no intervention - LHC mild non obstructive CAD.  - No chest pain.  - Continue statin. Off aspirin.   5. Cardiac sarcoid - PET 2/22 at [facility] EF 25% + active sarcoid - Has completed prednisone.  - Takes MTX 20 weekly, Saturday. Continue folic acid - Previously discussed EM biopsy but patient is reluctant  - Needs repeat PET, wasn't able to schedule 10/23 as insurance would not cover. Now has medicaid and medicare. Plan to reschedule once discharged   6. 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 AKI - suspect cardiorenal, improved w/ inotropic support - Creatinine back to baseline ~1 - follow BMP    8. Iron deficiency anemia - recent T sat 15%, scheduled for OP feraheme. Will complete inpatient    9. Pulmonary  - PFTs with severe obstructive defect, response  to bronchodilator - FEV1 1.04L, FEV1/FVC 48% - Consulted pulmonary to assist with pre VAD optimization. Appreciate assistance.   Length of Stay: 14  Tonye Becket, NP  08/24/2022, 8:39 AM  Advanced Heart Failure Team Pager [phone] (M-F; 7a - 5p)  Please contact [facility] for night-coverage after hours (5p -7a ) and weekends on [email or web address]  Patient seen and examined with the above-signed Advanced Practice Provider and/or Housestaff. I personally reviewed laboratory data, imaging studies and relevant notes. I independently examined the patient and formulated the important aspects of the plan. I have edited the note to reflect any of my changes or salient points. I have personally  discussed the plan with the patient and/or family.  Remains on milrinone co-ox ok. Diuresed briskly and feels better but CVP still 15.  General:  Lying in bed No resp difficulty HEENT: normal Neck: supple. JVP to jaw . Carotids 2+ bilat; no bruits. No lymphadenopathy or thryomegaly appreciated. Cor: PMI nondisplaced. Regular rate & rhythm. +s3 Lungs: clear Abdomen: soft, nontender, nondistended. No hepatosplenomegaly. No bruits or masses. Good bowel sounds. Extremities: no cyanosis, clubbing, rash, edema Neuro: alert & orientedx3, cranial nerves grossly intact. moves all 4 extremities w/o difficulty. Affect pleasant  Clinically appears improved on milrinone and diuresis but CVP still high. Continue IV lasix. Repeat limited echo to look at RV function. Tentative LVAD next week.   Arvilla Meres, MD  11:32 AM

## 2022-08-25 ENCOUNTER — Encounter (HOSPITAL_COMMUNITY): Payer: Medicare Other

## 2022-08-25 ENCOUNTER — Inpatient Hospital Stay (HOSPITAL_COMMUNITY): Payer: Medicare Other

## 2022-08-25 ENCOUNTER — Encounter (HOSPITAL_COMMUNITY)
Admission: EM | Disposition: A | Discharge status: Rehabilitation Facility | Payer: Self-pay | Source: Home / Self Care | Attending: Internal Medicine

## 2022-08-25 DIAGNOSIS — I509 Heart failure, unspecified: Secondary | ICD-10-CM | POA: Diagnosis not present

## 2022-08-25 DIAGNOSIS — I5023 Acute on chronic systolic (congestive) heart failure: Secondary | ICD-10-CM | POA: Diagnosis not present

## 2022-08-25 DIAGNOSIS — R570 Cardiogenic shock: Secondary | ICD-10-CM | POA: Diagnosis not present

## 2022-08-25 HISTORY — PX: IABP INSERTION: CATH118242

## 2022-08-25 HISTORY — PX: RIGHT HEART CATH: CATH118263

## 2022-08-25 LAB — POCT I-STAT EG7
Acid-Base Excess: 5 mmol/L — ABNORMAL HIGH (ref 0.0–2.0)
Acid-Base Excess: 9 mmol/L — ABNORMAL HIGH (ref 0.0–2.0)
Bicarbonate: 30.4 mmol/L — ABNORMAL HIGH (ref 20.0–28.0)
Bicarbonate: 35.5 mmol/L — ABNORMAL HIGH (ref 20.0–28.0)
Calcium, Ion: 0.9 mmol/L — ABNORMAL LOW (ref 1.15–1.40)
Calcium, Ion: 1.17 mmol/L (ref 1.15–1.40)
HCT: 28 % — ABNORMAL LOW (ref 39.0–52.0)
HCT: 33 % — ABNORMAL LOW (ref 39.0–52.0)
Hemoglobin: 11.2 g/dL — ABNORMAL LOW (ref 13.0–17.0)
Hemoglobin: 9.5 g/dL — ABNORMAL LOW (ref 13.0–17.0)
O2 Saturation: 46 %
O2 Saturation: 49 %
Potassium: 3.2 mmol/L — ABNORMAL LOW (ref 3.5–5.1)
Potassium: 4 mmol/L (ref 3.5–5.1)
Sodium: 132 mmol/L — ABNORMAL LOW (ref 135–145)
Sodium: 139 mmol/L (ref 135–145)
TCO2: 32 mmol/L (ref 22–32)
TCO2: 37 mmol/L — ABNORMAL HIGH (ref 22–32)
pCO2, Ven: 49 mmHg (ref 44–60)
pCO2, Ven: 55.9 mmHg (ref 44–60)
pH, Ven: 7.401 (ref 7.25–7.43)
pH, Ven: 7.411 (ref 7.25–7.43)
pO2, Ven: 26 mmHg — CL (ref 32–45)
pO2, Ven: 27 mmHg — CL (ref 32–45)

## 2022-08-25 LAB — COOXEMETRY PANEL
Carboxyhemoglobin: 1.9 % — ABNORMAL HIGH (ref 0.5–1.5)
Carboxyhemoglobin: 2.6 % — ABNORMAL HIGH (ref 0.5–1.5)
Methemoglobin: <0.7 % (ref 0.0–1.5)
Methemoglobin: 1.6 % — ABNORMAL HIGH (ref 0.0–1.5)
O2 Saturation: 48.4 %
O2 Saturation: 60.3 %
Total hemoglobin: 10.5 g/dL — ABNORMAL LOW (ref 12.0–16.0)
Total hemoglobin: 10.7 g/dL — ABNORMAL LOW (ref 12.0–16.0)

## 2022-08-25 LAB — RENAL FUNCTION PANEL
Albumin: 3.3 g/dL — ABNORMAL LOW (ref 3.5–5.0)
Anion gap: 10 (ref 5–15)
BUN: 38 mg/dL — ABNORMAL HIGH (ref 6–20)
CO2: 34 mmol/L — ABNORMAL HIGH (ref 22–32)
Calcium: 9.1 mg/dL (ref 8.9–10.3)
Chloride: 84 mmol/L — ABNORMAL LOW (ref 98–111)
Creatinine, Ser: 1.13 mg/dL (ref 0.61–1.24)
GFR, Estimated: >60 mL/min (ref >60)
Glucose, Bld: 146 mg/dL — ABNORMAL HIGH (ref 70–99)
Phosphorus: 4.4 mg/dL (ref 2.5–4.6)
Potassium: 3.6 mmol/L (ref 3.5–5.1)
Sodium: 128 mmol/L — ABNORMAL LOW (ref 135–145)

## 2022-08-25 LAB — BASIC METABOLIC PANEL
Anion gap: 12 (ref 5–15)
BUN: 27 mg/dL — ABNORMAL HIGH (ref 6–20)
CO2: 31 mmol/L (ref 22–32)
Calcium: 9.3 mg/dL (ref 8.9–10.3)
Chloride: 89 mmol/L — ABNORMAL LOW (ref 98–111)
Creatinine, Ser: 0.96 mg/dL (ref 0.61–1.24)
GFR, Estimated: >60 mL/min (ref >60)
Glucose, Bld: 76 mg/dL (ref 70–99)
Potassium: 4.4 mmol/L (ref 3.5–5.1)
Sodium: 132 mmol/L — ABNORMAL LOW (ref 135–145)

## 2022-08-25 LAB — POCT I-STAT 7, (LYTES, BLD GAS, ICA,H+H)
Acid-Base Excess: 9 mmol/L — ABNORMAL HIGH (ref 0.0–2.0)
Bicarbonate: 34.2 mmol/L — ABNORMAL HIGH (ref 20.0–28.0)
Calcium, Ion: 1.2 mmol/L (ref 1.15–1.40)
HCT: 33 % — ABNORMAL LOW (ref 39.0–52.0)
Hemoglobin: 11.2 g/dL — ABNORMAL LOW (ref 13.0–17.0)
O2 Saturation: 96 %
Potassium: 4.1 mmol/L (ref 3.5–5.1)
Sodium: 130 mmol/L — ABNORMAL LOW (ref 135–145)
TCO2: 36 mmol/L — ABNORMAL HIGH (ref 22–32)
pCO2 arterial: 47.7 mmHg (ref 32–48)
pH, Arterial: 7.464 — ABNORMAL HIGH (ref 7.35–7.45)
pO2, Arterial: 77 mmHg — ABNORMAL LOW (ref 83–108)

## 2022-08-25 LAB — HEPARIN LEVEL (UNFRACTIONATED): Heparin Unfractionated: 0.35 IU/mL (ref 0.30–0.70)

## 2022-08-25 SURGERY — RIGHT HEART CATH
Anesthesia: LOCAL

## 2022-08-25 MED ORDER — LIDOCAINE HCL (PF) 1 % IJ SOLN
INTRAMUSCULAR | Status: DC | PRN
  Administered 2022-08-25: 12 mL
  Administered 2022-08-25: 5 mL

## 2022-08-25 MED ORDER — SODIUM CHLORIDE 0.9 % IV SOLN: INTRAVENOUS | Status: DC

## 2022-08-25 MED ORDER — ACETAMINOPHEN 325 MG PO TABS
650.0000 mg | ORAL_TABLET | ORAL | Status: DC | PRN
  Administered 2022-09-04: 650 mg via ORAL
  Filled 2022-08-25: qty 2

## 2022-08-25 MED ORDER — SODIUM CHLORIDE 0.9 % IV SOLN
250.0000 mL | INTRAVENOUS | Status: DC | PRN
  Administered 2022-08-30 – 2022-09-13 (×3): 250 mL via INTRAVENOUS

## 2022-08-25 MED ORDER — IOHEXOL 350 MG/ML SOLN
INTRAVENOUS | Status: DC | PRN
  Administered 2022-08-25: 10 mL via INTRA_ARTERIAL

## 2022-08-25 MED ORDER — HEPARIN SODIUM (PORCINE) 1000 UNIT/ML IJ SOLN
INTRAMUSCULAR | Status: DC | PRN
  Administered 2022-08-25: 5000 [IU] via INTRAVENOUS

## 2022-08-25 MED ORDER — MIDAZOLAM HCL 2 MG/2ML IJ SOLN
INTRAMUSCULAR | Status: AC
  Filled 2022-08-25: qty 2

## 2022-08-25 MED ORDER — LORAZEPAM 2 MG/ML IJ SOLN
1.0000 mg | Freq: Three times a day (TID) | INTRAMUSCULAR | Status: DC | PRN
  Administered 2022-08-25 – 2022-08-28 (×4): 1 mg via INTRAVENOUS
  Filled 2022-08-25 (×4): qty 1

## 2022-08-25 MED ORDER — SODIUM CHLORIDE 0.9% FLUSH: 3.0000 mL | INTRAVENOUS | Status: DC | PRN

## 2022-08-25 MED ORDER — SODIUM CHLORIDE 0.9% FLUSH
3.0000 mL | Freq: Two times a day (BID) | INTRAVENOUS | Status: DC
  Administered 2022-08-26 – 2022-09-26 (×52): 3 mL via INTRAVENOUS

## 2022-08-25 MED ORDER — MIDAZOLAM HCL 2 MG/2ML IJ SOLN
INTRAMUSCULAR | Status: DC | PRN
  Administered 2022-08-25: 1 mg via INTRAVENOUS

## 2022-08-25 MED ORDER — SODIUM CHLORIDE 0.9 % IV SOLN
INTRAVENOUS | Status: AC | PRN
  Administered 2022-08-25: 10 mL/h via INTRAVENOUS

## 2022-08-25 MED ORDER — LIDOCAINE HCL (PF) 1 % IJ SOLN
INTRAMUSCULAR | Status: AC
  Filled 2022-08-25: qty 30

## 2022-08-25 MED ORDER — ASPIRIN 81 MG PO CHEW: 81.0000 mg | CHEWABLE_TABLET | ORAL | Status: DC

## 2022-08-25 MED ORDER — ONDANSETRON HCL 4 MG/2ML IJ SOLN: 4.0000 mg | Freq: Four times a day (QID) | INTRAMUSCULAR | Status: DC | PRN

## 2022-08-25 MED ORDER — HEPARIN (PORCINE) IN NACL 1000-0.9 UT/500ML-% IV SOLN
INTRAVENOUS | Status: DC | PRN
  Administered 2022-08-25: 500 mL

## 2022-08-25 MED ORDER — HYDRALAZINE HCL 20 MG/ML IJ SOLN: 10.0000 mg | INTRAMUSCULAR | Status: AC | PRN

## 2022-08-25 MED ORDER — HEPARIN SODIUM (PORCINE) 1000 UNIT/ML IJ SOLN
INTRAMUSCULAR | Status: AC
  Filled 2022-08-25: qty 10

## 2022-08-25 SURGICAL SUPPLY — 12 items
BALLN IABP SENSA PLUS 8F 50CC (BALLOONS) ×1 IMPLANT
BALLOON IABP SENS PLUS 8F 50CC (BALLOONS) IMPLANT
CATH SWAN GANZ 7F STRAIGHT (CATHETERS) IMPLANT
CATH-GARD ARROW CATH SHIELD (MISCELLANEOUS) ×1 IMPLANT
KIT MICROPUNCTURE NIT STIFF (SHEATH) IMPLANT
PACK CARDIAC CATHETERIZATION (CUSTOM PROCEDURE TRAY) ×1 IMPLANT
SHEATH PINNACLE 7F 10CM (SHEATH) IMPLANT
SHEATH PROBE COVER 6X72 (BAG) IMPLANT
SHIELD CATHGARD ARROW (MISCELLANEOUS) IMPLANT
TRANSDUCER W/STOPCOCK (MISCELLANEOUS) ×1 IMPLANT
WIRE EMERALD 3MM-J .035X150CM (WIRE) IMPLANT
WIRE MICRO SET SILHO 5FR 7 (SHEATH) IMPLANT

## 2022-08-25 NOTE — Progress Notes (Addendum)
IV team consulted for 2nd PIV for cardiac cath. Upon arriving to room, the cardiologist came to bedside. Spoke to MD about 2nd PIV for cath, he stated it was not necessary  at this time since the patient has a double lumen PICC. Primary RN notified.

## 2022-08-25 NOTE — Progress Notes (Signed)
This chaplain is present at the Pt. bedside for notarizing the Pt. HCPOA. The Pt. daughter-Kayla is visiting.   Notary services are not available at the time of the visit. The chaplain understands the Pt. is open to F/U on Monday.  Chaplain Stephanie Acre [phone]

## 2022-08-25 NOTE — Plan of Care (Signed)
  Problem: Health Behavior/Discharge Planning: Goal: Ability to manage health-related needs will improve Outcome: Progressing   

## 2022-08-25 NOTE — Progress Notes (Addendum)
Advanced Heart Failure Rounding Note  PCP-Cardiologist: Norman Herrlich, MD   Subjective:    7/25: Admit with cardiogenic shock. Started milrinone and NE. 8/6 S/P 13 teeth extractions  8/7 Diuresed with IV lasix + metolazone.  8/8 Diuresed with IV lasix + metolazone. ECHO repeated EF 15% RV mildly reduced. RVSP 77.  Remains on milrinone 0.375 mcg. CO-OX  48%   Creatinine trending up 1>1.6   Denies SOB.    Objective:   Weight Range: 74.3 kg Body mass index is 25.66 kg/m.   Vital Signs:   Temp:  [97.7 F (36.5 C)-98.5 F (36.9 C)] 98.1 F (36.7 C) (08/09 0826) Pulse Rate:  [84-102] 98 (08/09 0826) Resp:  [15-22] 18 (08/09 0826) BP: (82-109)/(56-77) 99/65 (08/09 0826) SpO2:  [93 %-97 %] 94 % (08/09 0826) FiO2 (%):  [21 %] 21 % (08/08 2120) Weight:  [74.3 kg] 74.3 kg (08/09 0419) Last BM Date : 08/23/22  Weight change: Filed Weights   08/23/22 0618 08/24/22 0533 08/25/22 0419  Weight: 75.8 kg 74.3 kg 74.3 kg    Intake/Output:   Intake/Output Summary (Last 24 hours) at 08/25/2022 0900 Last data filed at 08/25/2022 1610 Gross per 24 hour  Intake 575.07 ml  Output 2375 ml  Net -1799.93 ml  CVP 9-10  Physical Exam  General:   No resp difficulty HEENT: normal Neck: supple. JVP 9-10 . Carotids 2+ bilat; no bruits. No lymphadenopathy or thryomegaly appreciated. Cor: PMI nondisplaced. Regular rate & rhythm. No rubs, gallops or murmurs. Lungs: clear Abdomen: soft, nontender, nondistended. No hepatosplenomegaly. No bruits or masses. Good bowel sounds. Extremities: no cyanosis, clubbing, rash, edema. RUE PICC Neuro: alert & orientedx3, cranial nerves grossly intact. moves all 4 extremities w/o difficulty. Affect pleasant  Telemetry   V paced 90s   Labs    CBC Recent Labs    08/24/22 0450 08/25/22 0416  WBC 9.8 7.5  HGB 10.1* 10.0*  HCT 31.9* 31.3*  MCV 104.2* 102.6*  PLT 233 236    Basic Metabolic Panel Recent Labs    96/04/54 0450 08/25/22 0416  NA  133* 128*  K 4.0 4.1  CL 89* 84*  CO2 33* 33*  GLUCOSE 168* 149*  BUN 33* 41*  CREATININE 1.04 1.59*  CALCIUM 9.1 9.1  MG 1.9 1.9    BNP: BNP (last 3 results) Recent Labs    07/11/22 0209 07/26/22 1211 08/10/22 1142  BNP 1,588.2* 1,723.6* 1,905.0*     Imaging    DG Chest Port 1 View  Result Date: 08/25/2022 CLINICAL DATA:  CHF.  Follow-up exam. EXAM: PORTABLE CHEST 1 VIEW COMPARISON:  08/10/2022. FINDINGS: Stable enlargement of the cardiac silhouette. Stable left anterior chest wall biventricular cardioverter-defibrillator. No mediastinal or hilar masses. Right internal jugular central venous line has been removed. Right PICC is new, tip projecting in the mid superior vena cava. Bilateral interstitial thickening is stable from the prior exam. No lung consolidation. No pleural effusion or pneumothorax. Stable old right rib fractures. IMPRESSION: 1. Bilateral interstitial thickening similar to the prior exam, which suggests mild interstitial edema. No evidence of pneumonia. 2. Stable cardiomegaly. Electronically Signed   By: Amie Portland M.D.   On: 08/25/2022 07:50   ECHOCARDIOGRAM LIMITED  Result Date: 08/24/2022    ECHOCARDIOGRAM LIMITED REPORT   Patient Name:   Charles Holmes Date of Exam: 08/24/2022 Medical Rec #:  [id number]       Height:       67.0 in Accession #:  [id number]      Weight:       163.8 lb Date of Birth:  12-19-1965       BSA:          1.858 m Patient Age:    57 years        BP:           103/77 mmHg Patient Gender: M               HR:           99 bpm. Exam Location:  Inpatient Procedure: Limited Echo, Color Doppler and Cardiac Doppler Indications:    CHF  History:        Patient has prior history of Echocardiogram examinations, most                 recent 07/11/2022. CHF and Sarcoidosis, CAD, COPD and Stroke,                 Arrythmias:Atrial Fibrillation; Risk Factors:Hypertension and                 Dyslipidemia.  Sonographer:    Milbert Coulter Referring Phys: [id number]   R  IMPRESSIONS  1. 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 Left ventricular ejection fraction, by estimation, is 15%. The left ventricle has severely decreased function. The left ventricle demonstrates global hypokinesis. The left ventricular internal cavity size was severely dilated.  2. Right ventricular systolic function is mildly reduced. The right ventricular size is moderately enlarged. There is severely elevated pulmonary artery systolic pressure. The estimated right ventricular systolic pressure is 77.4 mmHg.  3. Left atrial size was mildly dilated.  4. A small pericardial effusion is present.  5. The mitral valve is grossly normal. Moderate mitral valve regurgitation.  6. Tricuspid valve regurgitation is moderate.  7. The aortic valve was not well visualized. Aortic valve regurgitation is trivial.  8. The inferior vena cava is dilated in size with <50% respiratory variability, suggesting right atrial pressure of 15 mmHg. 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 Left ventricular ejection fraction, by estimation, is 15%. The left ventricle has severely decreased function. The left ventricle demonstrates global hypokinesis. The left ventricular internal cavity size was severely dilated. Right Ventricle: The right ventricular size is moderately enlarged. Right ventricular systolic function is mildly reduced. There is severely elevated pulmonary artery systolic pressure. The tricuspid regurgitant velocity is 3.95 m/s, and with an assumed right atrial pressure of 15 mmHg, the estimated right ventricular systolic pressure is 77.4 mmHg. Left Atrium: Left atrial size was mildly dilated. Right Atrium: Right atrial size was normal in size. Pericardium: A small pericardial effusion is present. Mitral Valve: The mitral valve is grossly normal. Moderate mitral valve regurgitation. Tricuspid Valve: The tricuspid valve is grossly normal.  Tricuspid valve regurgitation is moderate. Aortic Valve: The aortic valve was not well visualized. Aortic valve regurgitation is trivial. Aortic valve mean gradient measures 3.0 mmHg. Aortic valve peak gradient measures 5.5 mmHg. Pulmonic Valve: The pulmonic valve was not assessed. Aorta: Aortic root could not be assessed. Venous: The inferior vena cava is dilated in size with less than 50% respiratory variability, suggesting right atrial pressure of 15 mmHg. Additional Comments: Spectral Doppler performed. Color Doppler performed.  LEFT VENTRICLE PLAX 2D LVIDd:         7.60 cm LVIDs:         7.40 cm LV PW:  1.20 cm LV IVS:        1.30 cm  LEFT ATRIUM           Index        RIGHT ATRIUM           Index LA diam:      4.20 cm 2.26 cm/m   RA Area:     17.60 cm LA Vol (A2C): 46.9 ml 25.25 ml/m  RA Volume:   47.60 ml  25.62 ml/m LA Vol (A4C): 53.6 ml 28.85 ml/m  AORTIC VALVE AV Vmax:           117.00 cm/s AV Vmean:          78.600 cm/s AV VTI:            0.159 m AV Peak Grad:      5.5 mmHg AV Mean Grad:      3.0 mmHg LVOT Vmax:         67.10 cm/s LVOT Vmean:        43.500 cm/s LVOT VTI:          0.085 m LVOT/AV VTI ratio: 0.53 Charles Peak grad:    63.0 mmHg    TRICUSPID VALVE Charles Mean grad:    44.0 mmHg    TR Peak grad:   62.4 mmHg Charles Vmax:         397.00 cm/s  TR Vmax:        395.00 cm/s Charles Vmean:        321.0 cm/s Charles PISA:         2.26 cm     SHUNTS Charles PISA Eff ROA: 22 mm       Systemic VTI: 0.08 m Charles PISA Radius:  0.60 cm Weston Brass MD Electronically signed by Weston Brass MD Signature Date/Time: 08/24/2022/5:26:33 PM    Final      Medications:     Scheduled Medications:  [START ON 08/26/2022] aspirin  81 mg Oral Pre-Cath   atorvastatin  80 mg Oral Daily   chlorhexidine  15 mL Mouth/Throat TID   Chlorhexidine Gluconate Cloth  6 each Topical Daily   dorzolamide-timolol  1 drop Both Eyes BID   feeding supplement  237 mL Oral TID BM   folic acid  1 mg Oral Daily   latanoprost  1 drop Both Eyes  QHS   methotrexate  20 mg Oral Q Sat   mexiletine  250 mg Oral BID   mometasone-formoterol  2 puff Inhalation BID   multivitamin with minerals  1 tablet Oral Daily   pantoprazole  40 mg Oral QAC breakfast   umeclidinium bromide  1 puff Inhalation Daily    Infusions:  [START ON 08/26/2022] sodium chloride     heparin 1,200 Units/hr (08/25/22 0316)   milrinone 0.375 mcg/kg/min (08/24/22 2145)    PRN Medications: acetaminophen, albuterol, diphenhydrAMINE, HYDROcodone-acetaminophen, ondansetron (ZOFRAN) IV, mouth rinse, sodium chloride flush  R/LHC 07/25 Findings: Ao = 98/73 (79) LV = 101/39 RA = 19 RV = 76/21 PA = 80/42 (57) PCW = 47 (v = 55) Fick cardiac output/index = 2.7/1.4 PVR = 3.0 WU Ao sat = 96% PA sat = 41%, 39% PAPi = 2.0  Patient Profile  Charles Holmes is a 57 y.o. 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 Admitted with cardiogenic shock.   Assessment/Plan  1.  Acute on chronic Systolic HF-->Cardiogenic Shock  - Diagnosed 11/2019. Presented with VT.  LHC 70% LAD  - cMRI 12/21 concerning for sarcoid and EF 18%.  - PET 2/22 at [facility] EF 25% + active sarcoid - Echo 08/26/20 EF < 20% severely dilated LV RV mildly decreased.  - CPX 8/22. Unable to complete PFTs due to hypoventilation and poor effort. pVO2 16.1 (49% predicted). Slope 19 RER: 1.17 PETCO2 59 - [employer] CRT-D upgrade in 06/08/21 - Echo 11/07/21: EF 20-25% RV mild to moderately reduced.  - Echo 07/10/22: EF <20%, RV okay, mod pericardial effusion, mod Charles/TR - Admitted 07/25 with cardiogenic shock. - RHC: Nonobstructive CAD, severely elevated filling pressures and low Fick CO/CI (2.7/1.4) - CO-OX 48% on milrinone 0.375.  - CVP 9-10 . Hold diuretics. Set up for RHC today with swan. Possible IABP.  - Discussed in [facility] 07/29. Likely not stable enough to discharge home before VAD. Plan for LVAD implant 08/13.  - S/P 13 teeth extractions yesterday with Dr.  Barbette Merino. Appreciate the assistance.    2.  Hx of stroke -Admitted 06/24 w/ R MCA stroke. S/p TPA and mechanical clot extraction. No residual deficits  -Likely cardioembolic in setting of severe LV dysfunction. 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   - Off eliquis  - On Heparin drip.   3. 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 Continue mexilitene  - now s/p ICD. No VT on interrogation.    4. CAD - LHC 12/07/19 70-% LAD, no intervention - LHC mild non obstructive CAD.  - No chest pain.  - Continue statin. Off aspirin.   5. Cardiac sarcoid - PET 2/22 at [facility] EF 25% + active sarcoid - Has completed prednisone.  - Takes MTX 20 weekly, Saturday. Continue folic acid - Previously discussed EM biopsy but patient is reluctant  - Needs repeat PET, wasn't able to schedule 10/23 as insurance would not cover. Now has medicaid and medicare. Plan to reschedule once discharged   6. 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 AKI - suspect cardiorenal, improved w/ inotropic support - Creatinine now trending up 1>1.6  - follow BMP    8. Iron deficiency anemia - recent T sat 15%, scheduled for OP feraheme. Will complete inpatient    9. Pulmonary  - PFTs with severe obstructive defect, response to bronchodilator - FEV1 1.04L, FEV1/FVC 48% - Consulted pulmonary to assist with pre VAD optimization. Appreciate assistance.   Length of Stay: 15  Amy Clegg, NP  08/25/2022, 9:00 AM  Advanced Heart Failure Team Pager [phone] (M-F; 7a - 5p)  Please contact [facility] for night-coverage after hours (5p -7a ) and weekends on [email or web address]   Agree with above.   Worsening HF overnight. Echo with severe LV dysfunction and severely elevated PA pressures. Scr up.   Based on clinic course and echo we took him to cath lab for RHC and IABP placement.   Now in ICU. Feeling better but having a hard time peeing.   General:  Weak  appearing. No resp difficulty HEENT: normal Neck: supple. RIJ swan Carotids 2+ bilat; no bruits. No lymphadenopathy or thryomegaly appreciated. Cor: Regular rate & rhythm.+ s3 Lungs: clear Abdomen: soft, nontender, nondistended. No hepatosplenomegaly. No bruits or masses. Good bowel sounds. Extremities: no cyanosis, clubbing, rash, edema  RFA IABP Neuro: alert & orientedx3, cranial nerves grossly intact. moves all 4 extremities w/o difficulty. Affect pleasant  He has severe low output HF. RHC today with worsening hemodynamics despite milrinone 0.375.  Will continue IABP support. Plan durable VAD next Tuesday. D/w Dr. Donata Clay.   CRITICAL CARE Performed by: Arvilla Meres  Total critical care time: 45 minutes  Critical care time was exclusive of separately billable procedures and treating other patients.  Critical care was necessary to treat or prevent imminent or life-threatening deterioration.  Critical care was time spent personally by me (independent of midlevel providers or residents) on the following activities: development of treatment plan with patient and/or surrogate as well as nursing, discussions with consultants, evaluation of patient's response to treatment, examination of patient, obtaining history from patient or surrogate, ordering and performing treatments and interventions, ordering and review of laboratory studies, ordering and review of radiographic studies, pulse oximetry and re-evaluation of patient's condition.  Arvilla Meres, MD  4:36 PM

## 2022-08-25 NOTE — Progress Notes (Signed)
3 Days Post-Op Procedure(s) (LRB): DENTAL RESTORATION/EXTRACTIONS (N/A) Subjective: Drop in coox with increased creatinine 3 days after multiple dental extractions Plan RHC today and possible IABP by AHF Patient does  not have SOB, malaise. Objective: Vital signs in last 24 hours: Temp:  [97.9 F (36.6 C)-98.5 F (36.9 C)] 98.1 F (36.7 C) (08/09 0826) Pulse Rate:  [84-102] 98 (08/09 0826) Cardiac Rhythm: Ventricular paced;Sinus tachycardia (08/09 0816) Resp:  [15-22] 18 (08/09 0826) BP: (82-109)/(56-77) 99/65 (08/09 0826) SpO2:  [93 %-97 %] 94 % (08/09 0826) FiO2 (%):  [21 %] 21 % (08/08 2120) Weight:  [74.3 kg] 74.3 kg (08/09 0419)  Hemodynamic parameters for last 24 hours: CVP:  [8 mmHg-15 mmHg] 8 mmHg  Intake/Output from previous day: 08/08 0701 - 08/09 0700 In: 815.1 [P.O.:240; I.V.:575.1] Out: 2675 [Urine:2675] Intake/Output this shift: Total I/O In: -  Out: 400 [Urine:400]       Exam    General- alert and comfortable    Neck- no JVD, no cervical adenopathy palpable, no carotid bruit   Lungs- clear without rales, wheezes   Cor- regular rate and rhythm, no murmur , gallop   Abdomen- soft, non-tender   Extremities - warm, non-tender, minimal edema   Neuro- oriented, appropriate, no focal weakness  Lab Results: Recent Labs    08/24/22 0450 08/25/22 0416  WBC 9.8 7.5  HGB 10.1* 10.0*  HCT 31.9* 31.3*  PLT 233 236   BMET:  Recent Labs    08/24/22 0450 08/25/22 0416  NA 133* 128*  K 4.0 4.1  CL 89* 84*  CO2 33* 33*  GLUCOSE 168* 149*  BUN 33* 41*  CREATININE 1.04 1.59*  CALCIUM 9.1 9.1    PT/INR: No results for input(s): "LABPROT", "INR" in the last 72 hours. ABG    Component Value Date/Time   PHART 7.434 08/10/2022 1831   HCO3 31.5 (H) 08/10/2022 1835   HCO3 34.0 (H) 08/10/2022 1835   TCO2 33 (H) 08/10/2022 1835   TCO2 36 (H) 08/10/2022 1835   O2SAT 60.3 08/25/2022 0840   CBG (last 3)  No results for input(s): "GLUCAP" in the last 72  hours.  Assessment/Plan: S/P Procedure(s) (LRB): DENTAL RESTORATION/EXTRACTIONS (N/A) RHC with poss IABP to bridge to [facility] implant next week  Patient states he has never had a Foley catheter placed due to urethral stenosis and this will need to be sorted out before OR next Tuesday- rec attempt catheter placement after IABP and if not successful call Urology for preop Foley placement.  LOS: 15 days    Charles Holmes 08/25/2022

## 2022-08-25 NOTE — Progress Notes (Signed)
VAD coordinator note:  Met pt this morning to discuss upcoming VAD implant. Pt is nervous about RHC today. We discussed this and talked about the plan for the weekend and how the IABP will optimize him for VAD on Tuesday. Pt is ready to get the surgery over and start to recover and feel better again. Pt was instructed that while in ICU on IABP he will need to use his IS as much as possible.  Pre - Intermacs follow up completed including:  Quality of Life, KCCQ-12, and Neurocognitive trail making.   Pt completed 600 feet during 6 minute walk.  Pt posted for VAD implant on Tuesday.  Carlton Adam RN, BSN VAD Coordinator 24/7 Pager [phone]

## 2022-08-25 NOTE — Progress Notes (Signed)
ANTICOAGULATION CONSULT NOTE - Follow Up  Pharmacy Consult for Heparin (Eliquis on hold) Indication: atrial fibrillation, IABP now in place  Allergies  Allergen Reactions   Amiodarone     Severe tremors   Percocet [Oxycodone-Acetaminophen] Itching    Patient Measurements: Height: 5\' 7"  (170.2 cm) Weight: 74.3 kg (163 lb 12.8 oz) IBW/kg (Calculated) : 66.1 Heparin Dosing Weight: total weight  Vital Signs: Temp: 98.4 F (36.9 C) (08/09 2300) Temp Source: Core (08/09 1800) BP: 126/87 (08/09 2200) Pulse Rate: 85 (08/09 2300)  Labs: Recent Labs    08/24/22 0450 08/24/22 3664 08/24/22 1624 08/25/22 0104 08/25/22 0416 08/25/22 0900 08/25/22 1226 08/25/22 1227 08/25/22 1240 08/25/22 2212  HGB 10.1*  --   --   --  10.0*  --  11.2* 9.5* 11.2*  --   HCT 31.9*  --   --   --  31.3*  --  33.0* 28.0* 33.0*  --   PLT 233  --   --   --  236  --   --   --   --   --   HEPARINUNFRC >1.10*   < > 0.27* 0.12*  --   --   --   --   --  0.35  CREATININE 1.04  --   --   --  1.59* 1.13  --   --   --  0.96   < > = values in this interval not displayed.    Estimated Creatinine Clearance: 77.5 mL/min (by C-G formula based on SCr of 0.96 mg/dL).   Medical History: Past Medical History:  Diagnosis Date   CAD (coronary artery disease)    CHF (congestive heart failure) (HCC)    GERD (gastroesophageal reflux disease)    Hyperlipidemia    Hypertension    Systolic heart failure (HCC) 2021   LVEF 18%, RVEF 38% on cardiac MRI 12/19/2019. possible cardiac sarcoidosis.   Wide-complex tachycardia 2021   wears LifeVest    Medications:  Scheduled:   atorvastatin  Charles mg Oral Daily   chlorhexidine  15 mL Mouth/Throat TID   Chlorhexidine Gluconate Cloth  6 each Topical Daily   dorzolamide-timolol  1 drop Both Eyes BID   feeding supplement  237 mL Oral TID BM   folic acid  1 mg Oral Daily   latanoprost  1 drop Both Eyes QHS   methotrexate  20 mg Oral Q Sat   mexiletine  250 mg Oral BID    mometasone-formoterol  2 puff Inhalation BID   multivitamin with minerals  1 tablet Oral Daily   pantoprazole  40 mg Oral QAC breakfast   sodium chloride flush  3 mL Intravenous Q12H   umeclidinium bromide  1 puff Inhalation Daily   Infusions:   sodium chloride     heparin 1,200 Units/hr (08/25/22 2300)   milrinone 0.375 mcg/kg/min (08/25/22 2300)   PRN: sodium chloride, acetaminophen, acetaminophen, albuterol, diphenhydrAMINE, HYDROcodone-acetaminophen, LORazepam, ondansetron (ZOFRAN) IV, ondansetron (ZOFRAN) IV, mouth rinse, sodium chloride flush, sodium chloride flush  Assessment: 57 yo Holmes on apixaban for afib, recent CVA 6/24 felt to be cardioembolic. Presents for cath, work up for LVAD. Last dose of apixaban 7/25, heparin infusion started in the interim. Heparin held 8/6 am for dental extractions.   8/9 PM update:  Heparin level therapeutic  S/P IABP placement today  Goal of Therapy:  Heparin level 0.3-0.5 units/ml Monitor platelets by anticoagulation protocol: Yes   Plan:  Cont heparin 1200 units/hr Heparin level with AM labs  Abran Duke, PharmD, BCPS Clinical Pharmacist Phone: [phone]

## 2022-08-25 NOTE — Progress Notes (Signed)
Pt appears anxious and uncomfortable multiple complaints about very small things, repeated statements "I can't eat like this", "I'm not going to be able to lay like this for 4 days" "I'm not going to be able to have my bowls move laying flat". Vad Coordinator and Dr Gala Romney updated orders received and carried out.

## 2022-08-25 NOTE — Plan of Care (Signed)
Pt progressing in goals of care.

## 2022-08-25 NOTE — Progress Notes (Signed)
ANTICOAGULATION CONSULT NOTE - Follow Up  Pharmacy Consult for Heparin (Eliquis on hold) Indication: atrial fibrillation  Allergies  Allergen Reactions   Amiodarone     Severe tremors   Percocet [Oxycodone-Acetaminophen] Itching    Patient Measurements: Height: 5\' 7"  (170.2 cm) Weight: 74.3 kg (163 lb 12.8 oz) IBW/kg (Calculated) : 66.1 Heparin Dosing Weight: total weight  Vital Signs: Temp: 98.3 F (36.8 C) (08/09 0214) Temp Source: Oral (08/09 0214) BP: 82/56 (08/09 0214) Pulse Rate: 97 (08/08 2120)  Labs: Recent Labs    08/22/22 2055 08/23/22 0829 08/24/22 0450 08/24/22 0633 08/24/22 1624 08/25/22 0104  HGB  --   --  10.1*  --   --   --   HCT  --   --  31.9*  --   --   --   PLT  --   --  233  --   --   --   HEPARINUNFRC  --   --  >1.10* 0.12* 0.27* 0.12*  CREATININE 1.18 1.04 1.04  --   --   --     Estimated Creatinine Clearance: 71.5 mL/min (by C-G formula based on SCr of 1.04 mg/dL).   Medical History: Past Medical History:  Diagnosis Date   CAD (coronary artery disease)    CHF (congestive heart failure) (HCC)    GERD (gastroesophageal reflux disease)    Hyperlipidemia    Hypertension    Systolic heart failure (HCC) 2021   LVEF 18%, RVEF 38% on cardiac MRI 12/19/2019. possible cardiac sarcoidosis.   Wide-complex tachycardia 2021   wears LifeVest    Medications:  Scheduled:   atorvastatin  80 mg Oral Daily   chlorhexidine  15 mL Mouth/Throat TID   Chlorhexidine Gluconate Cloth  6 each Topical Daily   dorzolamide-timolol  1 drop Both Eyes BID   feeding supplement  237 mL Oral TID BM   folic acid  1 mg Oral Daily   latanoprost  1 drop Both Eyes QHS   methotrexate  20 mg Oral Q Sat   mexiletine  250 mg Oral BID   mometasone-formoterol  2 puff Inhalation BID   multivitamin with minerals  1 tablet Oral Daily   pantoprazole  40 mg Oral QAC breakfast   umeclidinium bromide  1 puff Inhalation Daily   Infusions:   heparin 1,100 Units/hr (08/24/22  1736)   milrinone 0.375 mcg/kg/min (08/24/22 2145)   PRN: acetaminophen, albuterol, diphenhydrAMINE, HYDROcodone-acetaminophen, ondansetron (ZOFRAN) IV, mouth rinse, sodium chloride flush  Assessment: 57 yo male on apixaban for afib, recent CVA 6/24 felt to be cardioembolic. Presents for cath, work up for LVAD. Last dose of apixaban 7/25, heparin infusion started in the interim. Heparin held 8/6 am for dental extractions.   8/9 AM update:  Heparin level sub-therapeutic  Goal of Therapy:  Heparin level 0.3-0.5 units/ml Monitor platelets by anticoagulation protocol: Yes   Plan:  Increase heparin to 1200 units/hr - titrating slowly in setting of recent oral surgery Check heparin level in 6-8 hrs  Abran Duke, PharmD, BCPS Clinical Pharmacist Phone: [phone]

## 2022-08-25 NOTE — Progress Notes (Signed)
PROGRESS NOTE    Charles Holmes  OZH:[id number] DOB: Dec 05, 1965 DOA: 08/10/2022 PCP: [name], PA-C  [name] is a 57 y.o. 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 tach, cardiac sarcoidosis, paroxysmal A-fib presented to the ED with worsening dyspnea, fatigue X 12 days, in the ER he was volume overloaded, tachypneic with 3+ edema, chest x-ray noted cardiomegaly pulmonary vascular congestion bilateral infiltrates -Admitted, treated with diuretics -RHC 7/25 noted low cardiac output, high filling pressures, concern for cardiogenic shock, started on milrinone and nor epi -7/26 CT surgery consulted for possible LVAD -7/27 Lasix drip discontinued -Medically stable, awaiting LVAD -8/6 -13 teeth extractions  per [name] -8/9, plan for right heart cath   Subjective: -Breathing a little better, sore in his jaw  Assessment and Plan:  Acute on chronic systolic CHF, cardiogenic shock Moderate MR Moderate pericardial effusion -Echo Ef <20%, severe dilatation of LV cavity, akinetic, hypokinetic entire anterior wall, entire lateral wall, anterior septum, entire inferior wall, and apex. RV systolic function preserved, moderate pericardial effusion, moderate mitral valve regurgitation, moderate TR.  -Cardiac MRI in 12/21 was concerning for sarcoidosis -RHC this admit with nonobstructive CAD, severely elevated filling pressures and low Fick -Currently on milrinone, diuresed with IV Lasix over the last 3 days, creatinine up to 1.5 today, Co. ox 48, holding diuretics today, plan for right heart cath with Swan -Heart failure team and T CTS following, plan for LVAD implant 8/13 -13 teeth extractions completed per [name] 8/6, appreciate assistance  CAD (coronary artery disease) Coronary angiography with proximal Cx 20 % stenosis, mid LAD 50% stenosis. Continue ASA/statin  HTN (hypertension) Continue  milrinone.   Ventricular tachycardia (HCC) Patient on mexiletine.  Repleted mag  Cardiac sarcoidosis Continue with methotrexate.   AKI (acute kidney injury) (HCC) Hypokalemia.  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  -Given IV iron  Severe COPD -stable, continue Dulera, as needed albuterol  DVT prophylaxis: Hep gtt Code Status: Full Code Family Communication: none present Disposition Plan: TBD  Consultants: CHF team, cardiac surgery, oral surgery   Procedures:   Antimicrobials:    Objective: Vitals:   08/25/22 0315 08/25/22 0419 08/25/22 0740 08/25/22 0826  BP: 96/67   99/65  Pulse:    98  Resp: 15   18  Temp:    98.1 F (36.7 C)  TempSrc:    Oral  SpO2: 95%  95% 94%  Weight:  74.3 kg    Height:        Intake/Output Summary (Last 24 hours) at 08/25/2022 1119 Last data filed at 08/25/2022 6295 Gross per 24 hour  Intake 575.07 ml  Output 2375 ml  Net -1799.93 ml   Filed Weights   08/23/22 0618 08/24/22 0533 08/25/22 0419  Weight: 75.8 kg 74.3 kg 74.3 kg    Examination:  Gen: Awake, Alert, Oriented X 3,  HEENT: no JVD Lungs: Good air movement bilaterally, CTAB CVS: S1S2/RRR Abd: soft, Non tender, non distended, BS present Extremities: No edema Skin: no new rashes on exposed skin  Extremities: trace edema, R arm picc line Skin: no new rashes on exposed skin     Data Reviewed:   CBC: Recent Labs  Lab 08/19/22 0113 08/20/22  4332 08/24/22 0450 08/25/22 0416  WBC 6.9 7.0 9.8 7.5  HGB 10.3* 10.0* 10.1* 10.0*  HCT 31.9* 31.3* 31.9* 31.3*  MCV 107.4* 106.5* 104.2* 102.6*  PLT 181 183 233 236   Basic Metabolic Panel: Recent Labs  Lab 08/22/22 0500 08/22/22 2055  08/23/22 0829 08/24/22 0450 08/25/22 0416 08/25/22 0900  NA 133* 134* 133* 133* 128* 128*  K 3.9 4.3 4.6 4.0 4.1 3.6  CL 97* 98 95* 89* 84* 84*  CO2 28 26 26  33* 33* 34*  GLUCOSE 117* 171* 176* 168* 149* 146*  BUN 23* 25* 26* 33* 41* 38*  CREATININE 1.09 1.18 1.04 1.04 1.59* 1.13  CALCIUM 8.5* 8.8* 9.0 9.1 9.1 9.1  MG 2.2 2.0 2.0 1.9 1.9  --   PHOS  --   --   --   --   --  4.4   GFR: Estimated Creatinine Clearance: 65.8 mL/min (by C-G formula based on SCr of 1.13 mg/dL). Liver Function Tests: Recent Labs  Lab 08/25/22 0900  ALBUMIN 3.3*    No results for input(s): "LIPASE", "AMYLASE" in the last 168 hours. No results for input(s): "AMMONIA" in the last 168 hours. Coagulation Profile: No results for input(s): "INR", "PROTIME" in the last 168 hours.  Cardiac Enzymes: No results for input(s): "CKTOTAL", "CKMB", "CKMBINDEX", "TROPONINI" in the last 168 hours. BNP (last 3 results) No results for input(s): "PROBNP" in the last 8760 hours. HbA1C: No results for input(s): "HGBA1C" in the last 72 hours. CBG: Recent Labs  Lab 08/21/22 0527  GLUCAP 115*   Lipid Profile: No results for input(s): "CHOL", "HDL", "LDLCALC", "TRIG", "CHOLHDL", "LDLDIRECT" in the last 72 hours. Thyroid Function Tests: No results for input(s): "TSH", "T4TOTAL", "FREET4", "T3FREE", "THYROIDAB" in the last 72 hours. Anemia Panel: No results for input(s): "VITAMINB12", "FOLATE", "FERRITIN", "TIBC", "IRON", "RETICCTPCT" in the last 72 hours. Urine analysis:    Component Value Date/Time   COLORURINE STRAW (A) 08/11/2022 1328   APPEARANCEUR CLEAR 08/11/2022 1328   LABSPEC 1.006 08/11/2022 1328   PHURINE 7.0 08/11/2022 1328   GLUCOSEU 150 (A) 08/11/2022 1328   HGBUR NEGATIVE 08/11/2022 1328   BILIRUBINUR NEGATIVE 08/11/2022 1328   KETONESUR NEGATIVE 08/11/2022 1328   PROTEINUR NEGATIVE 08/11/2022 1328   NITRITE NEGATIVE 08/11/2022 1328   LEUKOCYTESUR NEGATIVE 08/11/2022 1328   Sepsis  Labs: @LABRCNTIP (procalcitonin:4,lacticidven:4)  ) No results found for this or any previous visit (from the past 240 hour(s)).    Radiology Studies: DG Chest Port 1 View  Result Date: 08/25/2022 CLINICAL DATA:  CHF.  Follow-up exam. EXAM: PORTABLE CHEST 1 VIEW COMPARISON:  08/10/2022. FINDINGS: Stable enlargement of the cardiac silhouette. Stable left anterior chest wall biventricular cardioverter-defibrillator. No mediastinal or hilar masses. Right internal jugular central venous line has been removed. Right PICC is new, tip projecting in the mid superior vena cava. Bilateral interstitial thickening is stable from the prior exam. No lung consolidation. No pleural effusion or pneumothorax. Stable old right rib fractures. IMPRESSION: 1. Bilateral interstitial thickening similar to the prior exam, which suggests mild interstitial edema. No evidence of pneumonia. 2. Stable cardiomegaly. Electronically Signed   By: Amie Portland M.D.   On: 08/25/2022 07:50   ECHOCARDIOGRAM LIMITED  Result Date: 08/24/2022    ECHOCARDIOGRAM LIMITED REPORT   Patient Name:   Charles Holmes Date of Exam: 08/24/2022 Medical Rec #:  [id number]       Height:       67.0 in Accession #:    [id number]  Weight:       163.8 lb Date of Birth:  1965/02/02       BSA:          1.858 m Patient Age:    57 years        BP:           103/77 mmHg Patient Gender: M               HR:           99 bpm. Exam Location:  Inpatient Procedure: Limited Echo, Color Doppler and Cardiac Doppler Indications:    CHF  History:        Patient has prior history of Echocardiogram examinations, most                 recent 07/11/2022. CHF and Sarcoidosis, CAD, COPD and Stroke,                 Arrythmias:Atrial Fibrillation; Risk Factors:Hypertension and                 Dyslipidemia.  Sonographer:    Milbert Coulter Referring Phys: [id number] DANIEL R BENSIMHON IMPRESSIONS  1. 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 Left ventricular ejection fraction, by  estimation, is 15%. The left ventricle has severely decreased function. The left ventricle demonstrates global hypokinesis. The left ventricular internal cavity size was severely dilated.  2. Right ventricular systolic function is mildly reduced. The right ventricular size is moderately enlarged. There is severely elevated pulmonary artery systolic pressure. The estimated right ventricular systolic pressure is 77.4 mmHg.  3. Left atrial size was mildly dilated.  4. A small pericardial effusion is present.  5. The mitral valve is grossly normal. Moderate mitral valve regurgitation.  6. Tricuspid valve regurgitation is moderate.  7. The aortic valve was not well visualized. Aortic valve regurgitation is trivial.  8. The inferior vena cava is dilated in size with <50% respiratory variability, suggesting right atrial pressure of 15 mmHg. 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 Left ventricular ejection fraction, by estimation, is 15%. The left ventricle has severely decreased function. The left ventricle demonstrates global hypokinesis. The left ventricular internal cavity size was severely dilated. Right Ventricle: The right ventricular size is moderately enlarged. Right ventricular systolic function is mildly reduced. There is severely elevated pulmonary artery systolic pressure. The tricuspid regurgitant velocity is 3.95 m/s, and with an assumed right atrial pressure of 15 mmHg, the estimated right ventricular systolic pressure is 77.4 mmHg. Left Atrium: Left atrial size was mildly dilated. Right Atrium: Right atrial size was normal in size. Pericardium: A small pericardial effusion is present. Mitral Valve: The mitral valve is grossly normal. Moderate mitral valve regurgitation. Tricuspid Valve: The tricuspid valve is grossly normal. Tricuspid valve regurgitation is moderate. Aortic Valve: The aortic valve was not well visualized. Aortic valve regurgitation is trivial.  Aortic valve mean gradient measures 3.0 mmHg. Aortic valve peak gradient measures 5.5 mmHg. Pulmonic Valve: The pulmonic valve was not assessed. Aorta: Aortic root could not be assessed. Venous: The inferior vena cava is dilated in size with less than 50% respiratory variability, suggesting right atrial pressure of 15 mmHg. Additional Comments: Spectral Doppler performed. Color Doppler performed.  LEFT VENTRICLE PLAX 2D LVIDd:         7.60 cm LVIDs:         7.40 cm LV PW:         1.20 cm  LV IVS:        1.30 cm  LEFT ATRIUM           Index        RIGHT ATRIUM           Index LA diam:      4.20 cm 2.26 cm/m   RA Area:     17.60 cm LA Vol (A2C): 46.9 ml 25.25 ml/m  RA Volume:   47.60 ml  25.62 ml/m LA Vol (A4C): 53.6 ml 28.85 ml/m  AORTIC VALVE AV Vmax:           117.00 cm/s AV Vmean:          78.600 cm/s AV VTI:            0.159 m AV Peak Grad:      5.5 mmHg AV Mean Grad:      3.0 mmHg LVOT Vmax:         67.10 cm/s LVOT Vmean:        43.500 cm/s LVOT VTI:          0.085 m LVOT/AV VTI ratio: 0.53 MR Peak grad:    63.0 mmHg    TRICUSPID VALVE MR Mean grad:    44.0 mmHg    TR Peak grad:   62.4 mmHg MR Vmax:         397.00 cm/s  TR Vmax:        395.00 cm/s MR Vmean:        321.0 cm/s MR PISA:         2.26 cm     SHUNTS MR PISA Eff ROA: 22 mm       Systemic VTI: 0.08 m MR PISA Radius:  0.60 cm Weston Brass MD Electronically signed by Weston Brass MD Signature Date/Time: 08/24/2022/5:26:33 PM    Final      Scheduled Meds:  [START ON 08/26/2022] aspirin  81 mg Oral Pre-Cath   atorvastatin  80 mg Oral Daily   chlorhexidine  15 mL Mouth/Throat TID   Chlorhexidine Gluconate Cloth  6 each Topical Daily   dorzolamide-timolol  1 drop Both Eyes BID   feeding supplement  237 mL Oral TID BM   folic acid  1 mg Oral Daily   latanoprost  1 drop Both Eyes QHS   methotrexate  20 mg Oral Q Sat   mexiletine  250 mg Oral BID   mometasone-formoterol  2 puff Inhalation BID   multivitamin with minerals  1 tablet Oral  Daily   pantoprazole  40 mg Oral QAC breakfast   umeclidinium bromide  1 puff Inhalation Daily   Continuous Infusions:  [START ON 08/26/2022] sodium chloride     heparin 1,200 Units/hr (08/25/22 0316)   milrinone 0.375 mcg/kg/min (08/25/22 0926)     LOS: 15 days    Time spent:    [name], MD [facility]   08/25/2022, 11:19 AM

## 2022-08-25 NOTE — TOC Progression Note (Signed)
Transition of Care [facility]) - Progression Note    Patient Details  Name: Charles Holmes MRN: [id number] Date of Birth: 05-25-1965  Transition of Care [facility]) CM/SW Contact  Elliot Cousin, RN Phone Number: [phone] 08/25/2022, 4:06 PM  Clinical Narrative: HF TOC CM spoke to pt at bedside. States he is prepared for surgery next week. Will continue to follow for dc needs.       Expected Discharge Plan: IP Rehab Facility Barriers to Discharge: Continued Medical Work up  Expected Discharge Plan and Services   Discharge Planning Services: CM Consult   Living arrangements for the past 2 months: Single Family Home                                       Social Determinants of Health (SDOH) Interventions SDOH Screenings   Food Insecurity: No Food Insecurity (08/13/2022)  Housing: Low Risk  (08/13/2022)  Transportation Needs: No Transportation Needs (08/13/2022)  Utilities: Not At Risk (08/13/2022)  Financial Resource Strain: Low Risk  (07/12/2022)  Tobacco Use: Medium Risk (08/22/2022)    Readmission Risk Interventions     No data to display

## 2022-08-25 NOTE — Interval H&P Note (Signed)
History and Physical Interval Note:  08/25/2022 12:09 PM  Charles Holmes  has presented today for surgery, with the diagnosis of heart failure.  The various methods of treatment have been discussed with the patient and family. After consideration of risks, benefits and other options for treatment, the patient has consented to  Procedure(s): RIGHT HEART CATH (N/A) and possible IABP as a surgical intervention.  The patient's history has been reviewed, patient examined, no change in status, stable for surgery.  I have reviewed the patient's chart and labs.  Questions were answered to the patient's satisfaction.      

## 2022-08-26 DIAGNOSIS — R570 Cardiogenic shock: Secondary | ICD-10-CM | POA: Diagnosis not present

## 2022-08-26 DIAGNOSIS — I5023 Acute on chronic systolic (congestive) heart failure: Secondary | ICD-10-CM | POA: Diagnosis not present

## 2022-08-26 DIAGNOSIS — I509 Heart failure, unspecified: Secondary | ICD-10-CM | POA: Diagnosis not present

## 2022-08-26 MED ORDER — POTASSIUM CHLORIDE CRYS ER 20 MEQ PO TBCR
40.0000 meq | EXTENDED_RELEASE_TABLET | Freq: Once | ORAL | Status: AC
  Administered 2022-08-26: 40 meq via ORAL
  Filled 2022-08-26: qty 2

## 2022-08-26 MED ORDER — SORBITOL 70 % SOLN
30.0000 mL | Freq: Once | Status: DC
  Filled 2022-08-26: qty 30

## 2022-08-26 MED ORDER — POLYETHYLENE GLYCOL 3350 17 G PO PACK
17.0000 g | PACK | Freq: Every day | ORAL | Status: DC
  Administered 2022-08-26 – 2022-08-28 (×3): 17 g via ORAL
  Filled 2022-08-26 (×2): qty 1

## 2022-08-26 MED ORDER — SENNOSIDES-DOCUSATE SODIUM 8.6-50 MG PO TABS
2.0000 | ORAL_TABLET | Freq: Every day | ORAL | Status: DC
  Administered 2022-08-26 – 2022-08-28 (×3): 2 via ORAL
  Filled 2022-08-26 (×3): qty 2

## 2022-08-26 MED ORDER — MAGNESIUM SULFATE 2 GM/50ML IV SOLN
2.0000 g | Freq: Once | INTRAVENOUS | Status: AC
  Administered 2022-08-26: 2 g via INTRAVENOUS
  Filled 2022-08-26: qty 50

## 2022-08-26 NOTE — Progress Notes (Signed)
Orthopedic Tech Progress Note Patient Details:  Charles Holmes May 12, 1965 [id number] Knee immobilizer was delivered to bedside. RN aware  Ortho Devices Type of Ortho Device: Knee Immobilizer Ortho Device/Splint Location: RLE Ortho Device/Splint Interventions: Ordered       E  08/26/2022, 11:20 AM

## 2022-08-26 NOTE — Progress Notes (Signed)
1 Day Post-Op Procedure(s) (LRB): RIGHT HEART CATH (N/A) IABP Insertion (N/A) Subjective: Feeling better with IABP Coox improved Better diuresis with creatinine 0.8  Objective: Vital signs in last 24 hours: Temp:  [97 F (36.1 C)-98.8 F (37.1 C)] 98.6 F (37 C) (08/10 1600) Pulse Rate:  [68-257] 91 (08/10 1600) Cardiac Rhythm: Ventricular paced (08/10 1200) Resp:  [11-23] 21 (08/10 1600) BP: (110-130)/(53-96) 112/96 (08/10 1600) SpO2:  [89 %-100 %] 93 % (08/10 1600) Arterial Line BP: (96-130)/(44-61) 102/44 (08/10 1600) Weight:  [72.2 kg] 72.2 kg (08/10 0600)  Hemodynamic parameters for last 24 hours: PAP: (26-68)/(19-46) 30/24 CVP:  [2 mmHg-25 mmHg] 3 mmHg PCWP:  [16 mmHg-19 mmHg] 19 mmHg CO:  [3.2 L/min-4.8 L/min] 4 L/min CI:  [1.7 L/min/m2-2.57 L/min/m2] 2.15 L/min/m2  Intake/Output from previous day: 08/09 0701 - 08/10 0700 In: 583.8 [I.V.:583.8] Out: 2675 [Urine:2675] Intake/Output this shift: Total I/O In: 234.8 [I.V.:200.8; IV Piggyback:34] Out: 1400 [Urine:1400]       Exam    General- alert and comfortable    Neck- no JVD, no cervical adenopathy palpable, no carotid bruit   Lungs- clear without rales, wheezes   Cor- regular rate and rhythm, no murmur , gallop   Abdomen- soft, non-tender   Extremities - warm, non-tender, minimal edema   Neuro- oriented, appropriate, no focal weakness   Lab Results: Recent Labs    08/25/22 0416 08/25/22 1226 08/25/22 1240 08/26/22 0504  WBC 7.5  --   --  7.4  HGB 10.0*   < > 11.2* 9.7*  HCT 31.3*   < > 33.0* 30.6*  PLT 236  --   --  195   < > = values in this interval not displayed.   BMET:  Recent Labs    08/25/22 2212 08/26/22 0504  NA 132* 133*  K 4.4 3.9  CL 89* 94*  CO2 31 31  GLUCOSE 76 81  BUN 27* 25*  CREATININE 0.96 0.84  CALCIUM 9.3 9.0    PT/INR: No results for input(s): "LABPROT", "INR" in the last 72 hours. ABG    Component Value Date/Time   PHART 7.464 (H) 08/25/2022 1240   HCO3  34.2 (H) 08/25/2022 1240   TCO2 36 (H) 08/25/2022 1240   O2SAT 65.1 08/26/2022 0504   CBG (last 3)  No results for input(s): "GLUCAP" in the last 72 hours.  Assessment/Plan: S/P Procedure(s) (LRB): RIGHT HEART CATH (N/A) IABP Insertion (N/A) IABP bridge to [facility] On milrinone 0.5, iv heparin OR planned am 8-13   LOS: 16 days    Lovett Sox 08/26/2022

## 2022-08-26 NOTE — Progress Notes (Signed)
Advanced Heart Failure Rounding Note  PCP-Cardiologist: Norman Herrlich, MD   Subjective:    7/25: Admit with cardiogenic shock. Started milrinone and NE. 8/6 S/P 13 teeth extractions  8/7 Diuresed with IV lasix + metolazone.  8/8 Diuresed with IV lasix + metolazone. ECHO repeated EF 15% RV mildly reduced. RVSP 77. 8/9 IABP placed  IABP placed yesterday for ongoing cardiogenic shock. Remains on milrinone 0.375   Co-ox 65%  On heparin. Feeling better. Denies CP or SOB. No bleeding on heparin.  Ernestine Conrad numbers done personally RA 10 PA 39/25 (32) PCWP 16 Thermo 3.2/1.7 SVR 1645  Objective:   Weight Range: 72.2 kg Body mass index is 24.93 kg/m.   Vital Signs:   Temp:  [97 F (36.1 C)-98.8 F (37.1 C)] 97.9 F (36.6 C) (08/10 0845) Pulse Rate:  [68-257] 83 (08/10 0845) Resp:  [11-23] 20 (08/10 0845) BP: (92-130)/(53-92) 124/92 (08/10 0800) SpO2:  [89 %-100 %] 97 % (08/10 0845) Arterial Line BP: (96-130)/(45-62) 114/53 (08/10 0800) Weight:  [72.2 kg] 72.2 kg (08/10 0600) Last BM Date : 08/23/22  Weight change: Filed Weights   08/24/22 0533 08/25/22 0419 08/26/22 0600  Weight: 74.3 kg 74.3 kg 72.2 kg    Intake/Output:   Intake/Output Summary (Last 24 hours) at 08/26/2022 0901 Last data filed at 08/26/2022 0800 Gross per 24 hour  Intake 604.44 ml  Output 2450 ml  Net -1845.56 ml   Physical Exam   General:  Lying in bed  No resp difficulty HEENT: normal Neck: supple. RIJ swan Carotids 2+ bilat; no bruits. No lymphadenopathy or thryomegaly appreciated. Cor: PMI nondisplaced. Regular rate & rhythm. No rubs, gallops or murmurs. Lungs: clear Abdomen: soft, nontender, nondistended. No hepatosplenomegaly. No bruits or masses. Good bowel sounds. Extremities: no cyanosis, clubbing, rash, edema + RFA IABP Neuro: alert & orientedx3, cranial nerves grossly intact. moves all 4 extremities w/o difficulty. Affect pleasant   Telemetry     NSR with V pacing 90s  Personally reviewed    Labs    CBC Recent Labs    08/25/22 0416 08/25/22 1226 08/25/22 1240 08/26/22 0504  WBC 7.5  --   --  7.4  HGB 10.0*   < > 11.2* 9.7*  HCT 31.3*   < > 33.0* 30.6*  MCV 102.6*  --   --  103.7*  PLT 236  --   --  195   < > = values in this interval not displayed.    Basic Metabolic Panel Recent Labs    16/10/96 0416 08/25/22 0900 08/25/22 1226 08/25/22 2212 08/26/22 0504  NA 128* 128*   < > 132* 133*  K 4.1 3.6   < > 4.4 3.9  CL 84* 84*  --  89* 94*  CO2 33* 34*  --  31 31  GLUCOSE 149* 146*  --  76 81  BUN 41* 38*  --  27* 25*  CREATININE 1.59* 1.13  --  0.96 0.84  CALCIUM 9.1 9.1  --  9.3 9.0  MG 1.9  --   --   --  1.8  PHOS  --  4.4  --   --   --    < > = values in this interval not displayed.    BNP: BNP (last 3 results) Recent Labs    07/11/22 0209 07/26/22 1211 08/10/22 1142  BNP 1,588.2* 1,723.6* 1,905.0*     Imaging    CARDIAC CATHETERIZATION  Result Date: 08/25/2022 Findings: On milrinone 0.375 mcg/kg/min RA =  10 RV = 62/12 PA = 68/31 (43) PCW = 22 Fick cardiac output/index = 4.3/2.3 TD CO/CI= 3.6/1.9 PVR = 4.9 WU (Fick) 5.9 (TD) FA sat = 89% PA sat = 46%, 49% PAPI = 3.7 Assessment: 1. Persistent decompensated HF despite milrinone support Plan/Discussion: IABP placed. Move to ICU. Adjust inotropes as needed. VAD next week. Charles Meres, MD 12:59 PM    Medications:     Scheduled Medications:  atorvastatin  80 mg Oral Daily   chlorhexidine  15 mL Mouth/Throat TID   Chlorhexidine Gluconate Cloth  6 each Topical Daily   dorzolamide-timolol  1 drop Both Eyes BID   feeding supplement  237 mL Oral TID BM   folic acid  1 mg Oral Daily   latanoprost  1 drop Both Eyes QHS   methotrexate  20 mg Oral Q Sat   mexiletine  250 mg Oral BID   mometasone-formoterol  2 puff Inhalation BID   multivitamin with minerals  1 tablet Oral Daily   pantoprazole  40 mg Oral QAC breakfast   sodium chloride flush  3 mL Intravenous Q12H    umeclidinium bromide  1 puff Inhalation Daily    Infusions:  sodium chloride     heparin 1,200 Units/hr (08/26/22 0800)   magnesium sulfate bolus IVPB 2 g (08/26/22 0852)   milrinone 0.375 mcg/kg/min (08/26/22 0800)    PRN Medications: sodium chloride, acetaminophen, acetaminophen, albuterol, diphenhydrAMINE, HYDROcodone-acetaminophen, LORazepam, ondansetron (ZOFRAN) IV, ondansetron (ZOFRAN) IV, mouth rinse, sodium chloride flush, sodium chloride flush   Patient Profile  Charles Holmes is a 57 y.o. 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 Admitted with cardiogenic shock.   Assessment/Plan   1.  Acute on chronic Systolic HF-->Cardiogenic Shock  - Diagnosed 11/2019. Presented with VT. LHC 70% LAD  - cMRI 12/21 concerning for sarcoid and EF 18%.  - PET 2/22 at [facility] EF 25% + active sarcoid - Echo 08/26/20 EF < 20% severely dilated LV RV mildly decreased.  - CPX 8/22. Unable to complete PFTs due to hypoventilation and poor effort. pVO2 16.1 (49% predicted). Slope 19 RER: 1.17 PETCO2 59 - [employer] CRT-D upgrade in 06/08/21 - Echo 07/10/22: EF <20%, RV okay, mod pericardial effusion, mod Charles/TR - Admitted 07/25 with cardiogenic shock. - RHC: Nonobstructive CAD, severely elevated filling pressures and low Fick CO/CI (2.7/1.4) - IABP placed 8/9 - Hemodynamics improved with marked reduction in PA pressures.Marland Kitchen Co-ox 65% But Thermo CI 1.7 and SVR high -> increase milrinone to 0.5 - Plan VAD on Tuesday  2.  Hx of stroke -Admitted 06/24 w/ R MCA stroke. S/p TPA and mechanical clot extraction. No residual deficits  -Likely cardioembolic in setting of severe LV dysfunction. 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   - Off eliquis  - On Heparin drip.   3. 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 Continue mexilitene  - now s/p ICD.   4. CAD - LHC 12/07/19 70-% LAD, no intervention - LHC 8/24 non  obstructive CAD.  - No chest pain.  - Continue statin. Off aspirin.   5. Cardiac sarcoid - PET 2/22 at [facility] EF 25% + active sarcoid - Has completed prednisone.  - Will stop methotrexate with upcoming surgery.  - Will sent apical core to pathology to confirm diagnosis of cardiac sarcoid  6. 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 AKI -  suspect cardiorenal, improved w/ inotropic support - Creatinine now trending up 1>1.6  - follow BMP    8. Iron deficiency anemia - recent T sat 15%, scheduled for OP feraheme. Will complete inpatient    9. Pulmonary  - PFTs with severe obstructive defect, response to bronchodilator - FEV1 1.04L, FEV1/FVC 48% - Consulted pulmonary to assist with pre VAD optimization. Appreciate assistance.  CRITICAL CARE Performed by: Charles Holmes  Total critical care time: 40 minutes  Critical care time was exclusive of separately billable procedures and treating other patients.  Critical care was necessary to treat or prevent imminent or life-threatening deterioration.  Critical care was time spent personally by me (independent of midlevel providers or residents) on the following activities: development of treatment plan with patient and/or surrogate as well as nursing, discussions with consultants, evaluation of patient's response to treatment, examination of patient, obtaining history from patient or surrogate, ordering and performing treatments and interventions, ordering and review of laboratory studies, ordering and review of radiographic studies, pulse oximetry and re-evaluation of patient's condition.    Length of Stay: 16  Charles Meres, MD  08/26/2022, 9:01 AM  Advanced Heart Failure Team Pager [phone] (M-F; 7a - 5p)  Please contact [facility] for night-coverage after hours (5p -7a ) and weekends on [email or web address]

## 2022-08-26 NOTE — Plan of Care (Signed)

## 2022-08-26 NOTE — Progress Notes (Signed)
ANTICOAGULATION CONSULT NOTE - Follow Up  Pharmacy Consult for Heparin (Eliquis on hold) Indication: atrial fibrillation, IABP now in place  Allergies  Allergen Reactions   Amiodarone     Severe tremors   Percocet [Oxycodone-Acetaminophen] Itching    Patient Measurements: Height: 5\' 7"  (170.2 cm) Weight: 72.2 kg (159 lb 2.8 oz) IBW/kg (Calculated) : 66.1 Heparin Dosing Weight: total weight  Vital Signs: Temp: 97.9 F (36.6 C) (08/10 0800) BP: 124/92 (08/10 0800) Pulse Rate: 102 (08/10 0800)  Labs: Recent Labs    08/24/22 0450 08/24/22 1610 08/25/22 0104 08/25/22 0416 08/25/22 0900 08/25/22 1226 08/25/22 1227 08/25/22 1240 08/25/22 2212 08/26/22 0504  HGB 10.1*  --   --  10.0*  --    < > 9.5* 11.2*  --  9.7*  HCT 31.9*  --   --  31.3*  --    < > 28.0* 33.0*  --  30.6*  PLT 233  --   --  236  --   --   --   --   --  195  HEPARINUNFRC >1.10*   < > 0.12*  --   --   --   --   --  0.35 0.44  CREATININE 1.04  --   --  1.59* 1.13  --   --   --  0.96 0.84   < > = values in this interval not displayed.    Estimated Creatinine Clearance: 88.5 mL/min (by C-G formula based on SCr of 0.84 mg/dL).   Medical History: Past Medical History:  Diagnosis Date   CAD (coronary artery disease)    CHF (congestive heart failure) (HCC)    GERD (gastroesophageal reflux disease)    Hyperlipidemia    Hypertension    Systolic heart failure (HCC) 2021   LVEF 18%, RVEF 38% on cardiac MRI 12/19/2019. possible cardiac sarcoidosis.   Wide-complex tachycardia 2021   wears LifeVest    Medications:  Scheduled:   atorvastatin  80 mg Oral Daily   chlorhexidine  15 mL Mouth/Throat TID   Chlorhexidine Gluconate Cloth  6 each Topical Daily   dorzolamide-timolol  1 drop Both Eyes BID   feeding supplement  237 mL Oral TID BM   folic acid  1 mg Oral Daily   latanoprost  1 drop Both Eyes QHS   methotrexate  20 mg Oral Q Sat   mexiletine  250 mg Oral BID   mometasone-formoterol  2 puff  Inhalation BID   multivitamin with minerals  1 tablet Oral Daily   pantoprazole  40 mg Oral QAC breakfast   sodium chloride flush  3 mL Intravenous Q12H   umeclidinium bromide  1 puff Inhalation Daily   Infusions:   sodium chloride     heparin 1,200 Units/hr (08/26/22 0800)   milrinone 0.375 mcg/kg/min (08/26/22 0800)   PRN: sodium chloride, acetaminophen, acetaminophen, albuterol, diphenhydrAMINE, HYDROcodone-acetaminophen, LORazepam, ondansetron (ZOFRAN) IV, ondansetron (ZOFRAN) IV, mouth rinse, sodium chloride flush, sodium chloride flush  Assessment: 57 yo male on apixaban pta for afib, recent CVA 6/24 felt to be cardioembolic. Presents with volume overload > cath, work up for LVAD. Last dose of apixaban 7/25, heparin infusion started in the interim. Heparin held 8/6 am for dental extractions and restarted after.  8/9 coox low > cath lab CI 1.9 on milrinone 0.375> IABP placed Heparin drip 1200 uts/hr with heparin level 0.44 at goal.  CBC stable, no bleeding noted  Planning LVAD next week   Goal of Therapy:  Heparin level  0.3-0.5 units/ml Monitor platelets by anticoagulation protocol: Yes   Plan:  Cont heparin 1200 units/hr Daily heparin level and cbc  Monitor s/s bleeding    Leota Sauers Pharm.D. CPP, BCPS Clinical Pharmacist [phone] 08/26/2022 8:18 AM

## 2022-08-27 ENCOUNTER — Inpatient Hospital Stay (HOSPITAL_COMMUNITY): Payer: Medicare Other

## 2022-08-27 DIAGNOSIS — R570 Cardiogenic shock: Secondary | ICD-10-CM | POA: Diagnosis not present

## 2022-08-27 DIAGNOSIS — I5023 Acute on chronic systolic (congestive) heart failure: Secondary | ICD-10-CM | POA: Diagnosis not present

## 2022-08-27 LAB — COMPREHENSIVE METABOLIC PANEL WITH GFR
ALT: 38 U/L (ref 0–44)
AST: 27 U/L (ref 15–41)
Albumin: 2.9 g/dL — ABNORMAL LOW (ref 3.5–5.0)
Alkaline Phosphatase: 73 U/L (ref 38–126)
Anion gap: 7 (ref 5–15)
BUN: 20 mg/dL (ref 6–20)
CO2: 29 mmol/L (ref 22–32)
Calcium: 8.6 mg/dL — ABNORMAL LOW (ref 8.9–10.3)
Chloride: 97 mmol/L — ABNORMAL LOW (ref 98–111)
Creatinine, Ser: 0.84 mg/dL (ref 0.61–1.24)
GFR, Estimated: >60 mL/min (ref >60)
Glucose, Bld: 98 mg/dL (ref 70–99)
Potassium: 4.3 mmol/L (ref 3.5–5.1)
Sodium: 133 mmol/L — ABNORMAL LOW (ref 135–145)
Total Bilirubin: 1 mg/dL (ref 0.3–1.2)
Total Protein: 5.3 g/dL — ABNORMAL LOW (ref 6.5–8.1)

## 2022-08-27 LAB — COOXEMETRY PANEL
Carboxyhemoglobin: 2.4 % — ABNORMAL HIGH (ref 0.5–1.5)
Methemoglobin: <0.7 % (ref 0.0–1.5)
O2 Saturation: 67.1 %
Total hemoglobin: 10.2 g/dL — ABNORMAL LOW (ref 12.0–16.0)

## 2022-08-27 LAB — CBC
HCT: 31.7 % — ABNORMAL LOW (ref 39.0–52.0)
Hemoglobin: 10 g/dL — ABNORMAL LOW (ref 13.0–17.0)
MCH: 33.8 pg (ref 26.0–34.0)
MCHC: 31.5 g/dL (ref 30.0–36.0)
MCV: 107.1 fL — ABNORMAL HIGH (ref 80.0–100.0)
Platelets: 181 10*3/uL (ref 150–400)
RBC: 2.96 MIL/uL — ABNORMAL LOW (ref 4.22–5.81)
RDW: 16.8 % — ABNORMAL HIGH (ref 11.5–15.5)
WBC: 6.5 10*3/uL (ref 4.0–10.5)
nRBC: 0 % (ref 0.0–0.2)

## 2022-08-27 LAB — HEPARIN LEVEL (UNFRACTIONATED): Heparin Unfractionated: 0.45 [IU]/mL (ref 0.30–0.70)

## 2022-08-27 LAB — MAGNESIUM: Magnesium: 2.1 mg/dL (ref 1.7–2.4)

## 2022-08-27 MED ORDER — SORBITOL 70 % SOLN
30.0000 mL | Freq: Once | Status: AC
  Administered 2022-08-27: 30 mL via ORAL

## 2022-08-27 NOTE — Progress Notes (Signed)
Advanced Heart Failure Rounding Note  PCP-Cardiologist: Norman Herrlich, MD   Subjective:    7/25: Admit with cardiogenic shock. Started milrinone and NE. 8/6 S/P 13 teeth extractions  8/7 Diuresed with IV lasix + metolazone.  8/8 Diuresed with IV lasix + metolazone. ECHO repeated EF 15% RV mildly reduced. RVSP 77. 8/9 IABP placed  IABP in place on 1:1 support. Remains on milrinone 0.5  Co-ox 67%  On heparin. Feeling better. Denies CP or SOB. No bleeding on heparin. Diuresing well.   Swan numbers done personally PAP: (26-63)/(19-35) 55/27 CVP:  [3 mmHg-15 mmHg] 10 mmHg PCWP:  [16 mmHg-19 mmHg] 16 mmHg CO:  [3.2 L/min-4.8 L/min] 3.9 L/min CI:  [1.7 L/min/m2-2.55 L/min/m2] 2.1 L/min/m2   Objective:   Weight Range: 72.5 kg Body mass index is 25.03 kg/m.   Vital Signs:   Temp:  [97.2 F (36.2 C)-99 F (37.2 C)] 98.4 F (36.9 C) (08/11 1000) Pulse Rate:  [69-279] 247 (08/11 1000) Resp:  [12-26] 19 (08/11 1000) BP: (109-135)/(60-96) 117/79 (08/11 1000) SpO2:  [84 %-100 %] 92 % (08/11 1000) Arterial Line BP: (94-125)/(44-66) 106/51 (08/11 1000) Weight:  [72.5 kg] 72.5 kg (08/11 0715) Last BM Date : 08/23/22  Weight change: Filed Weights   08/25/22 0419 08/26/22 0600 08/27/22 0715  Weight: 74.3 kg 72.2 kg 72.5 kg    Intake/Output:   Intake/Output Summary (Last 24 hours) at 08/27/2022 1019 Last data filed at 08/27/2022 1000 Gross per 24 hour  Intake 1042.67 ml  Output 2155 ml  Net -1112.33 ml   Physical Exam   General:  Lying in bed No resp difficulty HEENT: normal Neck: supple. RIJ swan. Carotids 2+ bilat; no bruits. No lymphadenopathy or thryomegaly appreciated. Cor: PMI nondisplaced. Regular rate & rhythm. No rubs, gallops or murmurs. Lungs: clear Abdomen: soft, nontender, nondistended. No hepatosplenomegaly. No bruits or masses. Good bowel sounds. Extremities: no cyanosis, clubbing, rash, edema RFA IABP Neuro: alert & orientedx3, cranial nerves grossly  intact. moves all 4 extremities w/o difficulty. Affect pleasant   Telemetry     NSR with V pacing 90s Personally reviewed    Labs    CBC Recent Labs    08/26/22 0504 08/27/22 0446  WBC 7.4 6.5  HGB 9.7* 10.0*  HCT 30.6* 31.7*  MCV 103.7* 107.1*  PLT 195 181    Basic Metabolic Panel Recent Labs    40/98/11 0900 08/25/22 1226 08/26/22 0504 08/27/22 0446  NA 128*   < > 133* 133*  K 3.6   < > 3.9 4.3  CL 84*   < > 94* 97*  CO2 34*   < > 31 29  GLUCOSE 146*   < > 81 98  BUN 38*   < > 25* 20  CREATININE 1.13   < > 0.84 0.84  CALCIUM 9.1   < > 9.0 8.6*  MG  --   --  1.8 2.1  PHOS 4.4  --   --   --    < > = values in this interval not displayed.    BNP: BNP (last 3 results) Recent Labs    07/11/22 0209 07/26/22 1211 08/10/22 1142  BNP 1,588.2* 1,723.6* 1,905.0*     Imaging    DG Chest Port 1 View  Result Date: 08/27/2022 CLINICAL DATA:  [id number] CHF (congestive heart failure) (HCC) 97293 EXAM: PORTABLE CHEST - 1 VIEW COMPARISON:  08/25/2022 FINDINGS: Right IJ central venous catheter placement to the proximal right pulmonary artery. Stable right arm PICC  line and left subclavian AICD. No pneumothorax. Lungs remain clear. Heart size upper limits normal. IABP tip at the level of the sixth rib in the proximal descending thoracic aorta. No effusion. Visualized bones unremarkable. IMPRESSION: Right IJ central venous catheter placement without pneumothorax. IABP tip proximal descending aorta Electronically Signed   By: Corlis Leak M.D.   On: 08/27/2022 09:44     Medications:     Scheduled Medications:  atorvastatin  80 mg Oral Daily   chlorhexidine  15 mL Mouth/Throat TID   Chlorhexidine Gluconate Cloth  6 each Topical Daily   dorzolamide-timolol  1 drop Both Eyes BID   feeding supplement  237 mL Oral TID BM   folic acid  1 mg Oral Daily   latanoprost  1 drop Both Eyes QHS   mexiletine  250 mg Oral BID   mometasone-formoterol  2 puff Inhalation BID    multivitamin with minerals  1 tablet Oral Daily   pantoprazole  40 mg Oral QAC breakfast   polyethylene glycol  17 g Oral Daily   senna-docusate  2 tablet Oral QHS   sodium chloride flush  3 mL Intravenous Q12H   sorbitol  30 mL Oral Once   umeclidinium bromide  1 puff Inhalation Daily    Infusions:  sodium chloride     heparin 1,200 Units/hr (08/27/22 1000)   milrinone 0.5 mcg/kg/min (08/27/22 1000)    PRN Medications: sodium chloride, acetaminophen, acetaminophen, albuterol, diphenhydrAMINE, HYDROcodone-acetaminophen, LORazepam, ondansetron (ZOFRAN) IV, ondansetron (ZOFRAN) IV, mouth rinse, sodium chloride flush, sodium chloride flush   Patient Profile  Charles Holmes is a 57 y.o. 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 Admitted with cardiogenic shock.   Assessment/Plan   1.  Acute on chronic Systolic HF-->Cardiogenic Shock  - Diagnosed 11/2019. Presented with VT. LHC 70% LAD  - cMRI 12/21 concerning for sarcoid and EF 18%.  - PET 2/[facility] EF 25% + active sarcoid - Echo 08/26/20 EF < 20% severely dilated LV RV mildly decreased.  - CPX 8/22. Unable to complete PFTs due to hypoventilation and poor effort. pVO2 16.1 (49% predicted). Slope 19 RER: 1.17 PETCO2 59 - [employer] CRT-D upgrade in 06/08/21 - Echo 07/10/22: EF <20%, RV okay, mod pericardial effusion, mod Charles/TR - Admitted 07/25 with cardiogenic shock. - RHC: Nonobstructive CAD, severely elevated filling pressures and low Fick CO/CI (2.7/1.4) - IABP placed 8/9 - Hemodynamics improved with reduction in PA pressures.Marland Kitchen Co-ox 67% Continue IABP and milrinone 0.5 - Plan VAD on Tuesday. D/w Dr. Donata Clay  2.  Hx of stroke -Admitted 06/24 w/ R MCA stroke. S/p TPA and mechanical clot extraction. No residual deficits  -Likely cardioembolic in setting of severe LV dysfunction. 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   - Off eliquis  - On heparin drip. Discussed  dosing with PharmD personally.  3. 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 Continue mexilitene  - now s/p ICD.   4. CAD - LHC 12/07/19 70-% LAD, no intervention - LHC 8/24 non obstructive CAD.  - No s/s angina - Continue statin. Off aspirin.   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  - Will stop methotrexate with upcoming surgery.  - Will sent apical core to pathology to confirm diagnosis of cardiac sarcoid  6. 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   7. AKI - suspect cardiorenal, improved w/ inotropic support - Creatinine now improved to 0.8 - follow BMP    8. Iron deficiency anemia - recent T sat 15%, scheduled for OP feraheme. Will complete inpatient    9. Pulmonary  - PFTs with severe obstructive defect, response to bronchodilator - FEV1 1.04L, FEV1/FVC 48% - Consulted pulmonary to assist with pre VAD optimization. Appreciate assistance. - Continue IS  CRITICAL CARE Performed by: Charles Holmes  Total critical care time: 45 minutes  Critical care time was exclusive of separately billable procedures and treating other patients.  Critical care was necessary to treat or prevent imminent or life-threatening deterioration.  Critical care was time spent personally by me (independent of midlevel providers or residents) on the following activities: development of treatment plan with patient and/or surrogate as well as nursing, discussions with consultants, evaluation of patient's response to treatment, examination of patient, obtaining history from patient or surrogate, ordering and performing treatments and interventions, ordering and review of laboratory studies, ordering and review of radiographic studies, pulse oximetry and re-evaluation of patient's condition.    Length of Stay: 17  Charles Meres, MD  08/27/2022,  10:19 AM  Advanced Heart Failure Team Pager [phone] (M-F; 7a - 5p)  Please contact [facility] for night-coverage after hours (5p -7a ) and weekends on [email or web address]

## 2022-08-27 NOTE — Progress Notes (Signed)
ANTICOAGULATION CONSULT NOTE - Follow Up  Pharmacy Consult for Heparin (Eliquis on hold) Indication: atrial fibrillation, IABP now in place  Allergies  Allergen Reactions   Amiodarone     Severe tremors   Percocet [Oxycodone-Acetaminophen] Itching    Patient Measurements: Height: 5\' 7"  (170.2 cm) Weight: 72.5 kg (159 lb 13.3 oz) IBW/kg (Calculated) : 66.1 Heparin Dosing Weight: total weight  Vital Signs: Temp: 98.4 F (36.9 C) (08/11 1100) Temp Source: Core (08/11 0800) BP: 117/79 (08/11 1000) Pulse Rate: 84 (08/11 1100)  Labs: Recent Labs    08/25/22 0416 08/25/22 0900 08/25/22 1240 08/25/22 2212 08/26/22 0504 08/27/22 0446  HGB 10.0*   < > 11.2*  --  9.7* 10.0*  HCT 31.3*   < > 33.0*  --  30.6* 31.7*  PLT 236  --   --   --  195 181  HEPARINUNFRC  --   --   --  0.35 0.44 0.45  CREATININE 1.59*   < >  --  0.96 0.84 0.84   < > = values in this interval not displayed.    Estimated Creatinine Clearance: 88.5 mL/min (by C-G formula based on SCr of 0.84 mg/dL).   Medical History: Past Medical History:  Diagnosis Date   CAD (coronary artery disease)    CHF (congestive heart failure) (HCC)    GERD (gastroesophageal reflux disease)    Hyperlipidemia    Hypertension    Systolic heart failure (HCC) 2021   LVEF 18%, RVEF 38% on cardiac MRI 12/19/2019. possible cardiac sarcoidosis.   Wide-complex tachycardia 2021   wears LifeVest    Medications:  Scheduled:   atorvastatin  80 mg Oral Daily   chlorhexidine  15 mL Mouth/Throat TID   Chlorhexidine Gluconate Cloth  6 each Topical Daily   dorzolamide-timolol  1 drop Both Eyes BID   feeding supplement  237 mL Oral TID BM   folic acid  1 mg Oral Daily   latanoprost  1 drop Both Eyes QHS   mexiletine  250 mg Oral BID   mometasone-formoterol  2 puff Inhalation BID   multivitamin with minerals  1 tablet Oral Daily   pantoprazole  40 mg Oral QAC breakfast   polyethylene glycol  17 g Oral Daily   senna-docusate  2  tablet Oral QHS   sodium chloride flush  3 mL Intravenous Q12H   sorbitol  30 mL Oral Once   sorbitol  30 mL Oral Once   umeclidinium bromide  1 puff Inhalation Daily   Infusions:   sodium chloride     heparin 1,200 Units/hr (08/27/22 1100)   milrinone 0.5 mcg/kg/min (08/27/22 1118)   PRN: sodium chloride, acetaminophen, acetaminophen, albuterol, diphenhydrAMINE, HYDROcodone-acetaminophen, LORazepam, ondansetron (ZOFRAN) IV, ondansetron (ZOFRAN) IV, mouth rinse, sodium chloride flush, sodium chloride flush  Assessment: 57 yo male on apixaban pta for afib, recent CVA 6/24 felt to be cardioembolic. Presents with volume overload > cath, work up for LVAD. Last dose of apixaban 7/25, heparin infusion started in the interim. Heparin held 8/6 am for dental extractions and restarted after.  8/9 coox low > cath lab CI 1.9 on milrinone 0.375>0.5 + IABP placed Heparin drip 1200 uts/hr with heparin level 0.4 at goal.  CBC stable, no bleeding noted  Planning LVAD next week   Goal of Therapy:  Heparin level 0.3-0.5 units/ml Monitor platelets by anticoagulation protocol: Yes   Plan:  Cont heparin 1200 units/hr Daily heparin level and cbc  Monitor s/s bleeding    Leota Sauers  Pharm.D. CPP, BCPS Clinical Pharmacist [phone] 08/27/2022 11:19 AM

## 2022-08-28 ENCOUNTER — Inpatient Hospital Stay (HOSPITAL_COMMUNITY): Payer: Medicare Other

## 2022-08-28 ENCOUNTER — Encounter (HOSPITAL_COMMUNITY): Payer: Self-pay | Admitting: Internal Medicine

## 2022-08-28 DIAGNOSIS — I5023 Acute on chronic systolic (congestive) heart failure: Secondary | ICD-10-CM | POA: Diagnosis not present

## 2022-08-28 DIAGNOSIS — I509 Heart failure, unspecified: Secondary | ICD-10-CM | POA: Diagnosis not present

## 2022-08-28 DIAGNOSIS — R570 Cardiogenic shock: Secondary | ICD-10-CM | POA: Diagnosis not present

## 2022-08-28 DIAGNOSIS — Z8673 Personal history of transient ischemic attack (TIA), and cerebral infarction without residual deficits: Secondary | ICD-10-CM | POA: Diagnosis not present

## 2022-08-28 DIAGNOSIS — Z515 Encounter for palliative care: Secondary | ICD-10-CM | POA: Diagnosis not present

## 2022-08-28 DIAGNOSIS — J449 Chronic obstructive pulmonary disease, unspecified: Secondary | ICD-10-CM | POA: Diagnosis not present

## 2022-08-28 LAB — TYPE AND SCREEN
ABO/RH(D): O POS
Antibody Screen: NEGATIVE
Unit division: 0
Unit division: 0
Unit division: 0
Unit division: 0

## 2022-08-28 LAB — CBC
HCT: 32.8 % — ABNORMAL LOW (ref 39.0–52.0)
Hemoglobin: 10.3 g/dL — ABNORMAL LOW (ref 13.0–17.0)
MCH: 32.3 pg (ref 26.0–34.0)
MCHC: 31.4 g/dL (ref 30.0–36.0)
MCV: 102.8 fL — ABNORMAL HIGH (ref 80.0–100.0)
Platelets: 162 10*3/uL (ref 150–400)
RBC: 3.19 MIL/uL — ABNORMAL LOW (ref 4.22–5.81)
RDW: 16.2 % — ABNORMAL HIGH (ref 11.5–15.5)
WBC: 7.9 10*3/uL (ref 4.0–10.5)
nRBC: 0 % (ref 0.0–0.2)

## 2022-08-28 LAB — APTT: aPTT: 72 seconds — ABNORMAL HIGH (ref 24–36)

## 2022-08-28 LAB — BPAM RBC
Blood Product Expiration Date: 202409062359
Blood Product Expiration Date: 202409062359
Blood Product Expiration Date: 202409062359
Blood Product Expiration Date: 202409062359
Unit Type and Rh: 5100
Unit Type and Rh: 5100
Unit Type and Rh: 5100
Unit Type and Rh: 5100

## 2022-08-28 LAB — URINALYSIS, ROUTINE W REFLEX MICROSCOPIC
Bilirubin Urine: NEGATIVE
Glucose, UA: NEGATIVE mg/dL
Hgb urine dipstick: NEGATIVE
Ketones, ur: NEGATIVE mg/dL
Leukocytes,Ua: NEGATIVE
Nitrite: NEGATIVE
Protein, ur: NEGATIVE mg/dL
Specific Gravity, Urine: 1.016 (ref 1.005–1.030)
pH: 7 (ref 5.0–8.0)

## 2022-08-28 LAB — PREPARE RBC (CROSSMATCH)

## 2022-08-28 LAB — PROTIME-INR
INR: 1.1 (ref 0.8–1.2)
Prothrombin Time: 14.8 seconds (ref 11.4–15.2)

## 2022-08-28 LAB — SURGICAL PCR SCREEN
MRSA, PCR: NEGATIVE
Staphylococcus aureus: NEGATIVE

## 2022-08-28 MED ORDER — CHLORHEXIDINE GLUCONATE 0.12 % MT SOLN
15.0000 mL | Freq: Once | OROMUCOSAL | Status: AC
  Administered 2022-08-29: 15 mL via OROMUCOSAL
  Filled 2022-08-28: qty 15

## 2022-08-28 MED ORDER — SORBITOL 70 % SOLN
960.0000 mL | TOPICAL_OIL | Freq: Once | ORAL | Status: DC
  Filled 2022-08-28: qty 240

## 2022-08-28 MED ORDER — CHLORHEXIDINE GLUCONATE CLOTH 2 % EX PADS
6.0000 | MEDICATED_PAD | Freq: Once | CUTANEOUS | Status: AC
  Administered 2022-08-28: 6 via TOPICAL

## 2022-08-28 MED ORDER — DOBUTAMINE-DEXTROSE 4-5 MG/ML-% IV SOLN
2.0000 ug/kg/min | INTRAVENOUS | Status: DC
  Filled 2022-08-28: qty 250

## 2022-08-28 MED ORDER — NITROGLYCERIN IN D5W 200-5 MCG/ML-% IV SOLN
0.0000 ug/min | INTRAVENOUS | Status: DC
  Filled 2022-08-28: qty 250

## 2022-08-28 MED ORDER — NOREPINEPHRINE 4 MG/250ML-% IV SOLN
0.0000 ug/min | INTRAVENOUS | Status: AC
  Administered 2022-08-29: 2 ug/min via INTRAVENOUS
  Filled 2022-08-28: qty 250

## 2022-08-28 MED ORDER — VANCOMYCIN HCL 1250 MG/250ML IV SOLN
1250.0000 mg | INTRAVENOUS | Status: AC
  Administered 2022-08-29: 1250 mg via INTRAVENOUS
  Filled 2022-08-28: qty 250

## 2022-08-28 MED ORDER — TEMAZEPAM 15 MG PO CAPS: 15.0000 mg | ORAL_CAPSULE | Freq: Once | ORAL | Status: DC | PRN

## 2022-08-28 MED ORDER — SODIUM CHLORIDE 0.9 % IV SOLN
600.0000 mg | INTRAVENOUS | Status: AC
  Administered 2022-08-29: 600 mg via INTRAVENOUS
  Filled 2022-08-28: qty 10

## 2022-08-28 MED ORDER — EPINEPHRINE HCL 5 MG/250ML IV SOLN IN NS
0.0000 ug/min | INTRAVENOUS | Status: AC
  Administered 2022-08-29: 3 ug/min via INTRAVENOUS
  Filled 2022-08-28: qty 250

## 2022-08-28 MED ORDER — CEFAZOLIN SODIUM-DEXTROSE 2-4 GM/100ML-% IV SOLN
2.0000 g | INTRAVENOUS | Status: AC
  Administered 2022-08-29 (×2): 2 g via INTRAVENOUS
  Filled 2022-08-28: qty 100

## 2022-08-28 MED ORDER — BISACODYL 5 MG PO TBEC
5.0000 mg | DELAYED_RELEASE_TABLET | Freq: Once | ORAL | Status: AC
  Administered 2022-08-28: 5 mg via ORAL
  Filled 2022-08-28: qty 1

## 2022-08-28 MED ORDER — INSULIN REGULAR(HUMAN) IN NACL 100-0.9 UT/100ML-% IV SOLN
INTRAVENOUS | Status: AC
  Administered 2022-08-29: 1.2 [IU]/h via INTRAVENOUS
  Filled 2022-08-28: qty 100

## 2022-08-28 MED ORDER — MUPIROCIN 2 % EX OINT
1.0000 | TOPICAL_OINTMENT | Freq: Two times a day (BID) | CUTANEOUS | Status: DC
  Administered 2022-08-28: 1 via NASAL
  Filled 2022-08-28: qty 22

## 2022-08-28 MED ORDER — VANCOMYCIN HCL 1 G IV SOLR
1000.0000 mg | INTRAVENOUS | Status: DC
  Filled 2022-08-28: qty 20

## 2022-08-28 MED ORDER — SORBITOL 70 % SOLN: 60.0000 mL | Freq: Once | Status: DC

## 2022-08-28 MED ORDER — TRANEXAMIC ACID (OHS) BOLUS VIA INFUSION
15.0000 mg/kg | INTRAVENOUS | Status: AC
  Administered 2022-08-29: 1173 mg via INTRAVENOUS
  Filled 2022-08-28: qty 1173

## 2022-08-28 MED ORDER — CHLORHEXIDINE GLUCONATE CLOTH 2 % EX PADS
6.0000 | MEDICATED_PAD | Freq: Once | CUTANEOUS | Status: AC
  Administered 2022-08-29: 6 via TOPICAL

## 2022-08-28 MED ORDER — TRANEXAMIC ACID (OHS) PUMP PRIME SOLUTION
2.0000 mg/kg | INTRAVENOUS | Status: DC
  Filled 2022-08-28: qty 1.56

## 2022-08-28 MED ORDER — FLUCONAZOLE IN SODIUM CHLORIDE 400-0.9 MG/200ML-% IV SOLN
400.0000 mg | INTRAVENOUS | Status: AC
  Administered 2022-08-29: 400 mg via INTRAVENOUS
  Filled 2022-08-28: qty 200

## 2022-08-28 MED ORDER — DOPAMINE-DEXTROSE 3.2-5 MG/ML-% IV SOLN
0.0000 ug/kg/min | INTRAVENOUS | Status: DC
  Filled 2022-08-28: qty 250

## 2022-08-28 MED ORDER — PHENYLEPHRINE HCL-NACL 20-0.9 MG/250ML-% IV SOLN
0.0000 ug/min | INTRAVENOUS | Status: DC
  Filled 2022-08-28: qty 250

## 2022-08-28 MED ORDER — CEFAZOLIN SODIUM-DEXTROSE 2-4 GM/100ML-% IV SOLN
2.0000 g | INTRAVENOUS | Status: DC
  Filled 2022-08-28: qty 100

## 2022-08-28 MED ORDER — MILRINONE LACTATE IN DEXTROSE 20-5 MG/100ML-% IV SOLN
0.3000 ug/kg/min | INTRAVENOUS | Status: AC
  Administered 2022-08-29: .5 ug/kg/min via INTRAVENOUS
  Filled 2022-08-28: qty 100

## 2022-08-28 MED ORDER — SORBITOL 70 % SOLN
30.0000 mL | Freq: Once | Status: DC
  Filled 2022-08-28: qty 30

## 2022-08-28 MED ORDER — TRANEXAMIC ACID 1000 MG/10ML IV SOLN
1.5000 mg/kg/h | INTRAVENOUS | Status: AC
  Administered 2022-08-29: 1.5 mg/kg/h via INTRAVENOUS
  Filled 2022-08-28: qty 25

## 2022-08-28 MED ORDER — MAGNESIUM SULFATE 50 % IJ SOLN
40.0000 meq | INTRAMUSCULAR | Status: DC
  Filled 2022-08-28: qty 9.85

## 2022-08-28 MED ORDER — HEPARIN 30,000 UNITS/1000 ML (OHS) CELLSAVER SOLUTION
Status: DC
  Filled 2022-08-28: qty 1000

## 2022-08-28 MED ORDER — ALPRAZOLAM 0.25 MG PO TABS: 0.2500 mg | ORAL_TABLET | Freq: Two times a day (BID) | ORAL | Status: DC | PRN

## 2022-08-28 MED ORDER — DEXMEDETOMIDINE HCL IN NACL 400 MCG/100ML IV SOLN
0.1000 ug/kg/h | INTRAVENOUS | Status: AC
  Administered 2022-08-29: .7 ug/kg/h via INTRAVENOUS
  Filled 2022-08-28: qty 100

## 2022-08-28 MED ORDER — CHLORHEXIDINE GLUCONATE CLOTH 2 % EX PADS: 6.0000 | MEDICATED_PAD | Freq: Once | CUTANEOUS | Status: AC

## 2022-08-28 MED ORDER — MAGNESIUM SULFATE 2 GM/50ML IV SOLN
2.0000 g | Freq: Once | INTRAVENOUS | Status: AC
  Administered 2022-08-28: 2 g via INTRAVENOUS
  Filled 2022-08-28: qty 50

## 2022-08-28 MED ORDER — POTASSIUM CHLORIDE 2 MEQ/ML IV SOLN
80.0000 meq | INTRAVENOUS | Status: DC
  Filled 2022-08-28: qty 40

## 2022-08-28 MED ORDER — VASOPRESSIN 20 UNITS/100 ML INFUSION FOR SHOCK
0.0400 [IU]/min | INTRAVENOUS | Status: DC
  Filled 2022-08-28: qty 100

## 2022-08-28 MED ORDER — SORBITOL 70 % SOLN
960.0000 mL | TOPICAL_OIL | Freq: Once | ORAL | Status: AC
  Administered 2022-08-28: 960 mL via RECTAL
  Filled 2022-08-28: qty 240

## 2022-08-28 NOTE — Progress Notes (Signed)
Patient ID: Charles Holmes, male   DOB: [birth year], 57 y.o.   MRN: [id number]    Progress Note from the Palliative Medicine Team at [facility]   Patient Name: Charles Holmes        Date: 08/28/2022 DOB: 08/29/1965  Age: 57 y.o. MRN#: [id number] Attending Physician: Dolores Patty, MD Primary Care Physician: [name], PA-C Admit Date: 08/10/2022    Extensive chart review has been completed prior to meeting with patient  including labs, vital signs, imaging, progress/consult notes, orders, medications and available advance directive documents.   57 y.o. 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 In process for LVAD workup.  Plan is for LVAD implantation in the morning    This NP assessed patient at the bedside as a follow up for palliative medicine needs and emotional support.  Patient is able to verbalize his anxiety over anticipated surgery/LVAD in the morning.  He expresses readiness, "I just want to get on with this"  Emotional support offered.  Education regarding the power of positive thoughts.    I offered to reach out to his daughter, he tells me she is good right now and does not have any needs.  PMT will continue to support patient and family holistically.   Education offered today regarding  the importance of continued conversation with his  family and the medical providers regarding overall plan of care and treatment options,  ensuring decisions are within the context of the patients values and GOCs.  Questions and concerns addressed   Discussed with bedside RN   Time: 25 minutes     Lorinda Creed NP  Palliative Medicine Team Team Phone # [phone] Pager [phone]

## 2022-08-28 NOTE — Progress Notes (Addendum)
ANTICOAGULATION CONSULT NOTE - Follow Up  Pharmacy Consult for Heparin (Eliquis on hold) Indication: atrial fibrillation, IABP now in place  Allergies  Allergen Reactions   Amiodarone     Severe tremors   Percocet [Oxycodone-Acetaminophen] Itching    Patient Measurements: Height: 5\' 7"  (170.2 cm) Weight: 78.2 kg (172 lb 6.4 oz) IBW/kg (Calculated) : 66.1 Heparin Dosing Weight: total weight  Vital Signs: Temp: 98.2 F (36.8 C) (08/12 0815) BP: 135/82 (08/12 0800) Pulse Rate: 80 (08/12 0815)  Labs: Recent Labs    08/26/22 0504 08/27/22 0446 08/28/22 0405 08/28/22 0905  HGB 9.7* 10.0*  --  10.3*  HCT 30.6* 31.7*  --  32.8*  PLT 195 181  --  162  HEPARINUNFRC 0.44 0.45 0.40  --   CREATININE 0.84 0.84 0.80  --     Estimated Creatinine Clearance: 93 mL/min (by C-G formula based on SCr of 0.8 mg/dL).   Medical History: Past Medical History:  Diagnosis Date   CAD (coronary artery disease)    CHF (congestive heart failure) (HCC)    GERD (gastroesophageal reflux disease)    Hyperlipidemia    Hypertension    Systolic heart failure (HCC) 2021   LVEF 18%, RVEF 38% on cardiac MRI 12/19/2019. possible cardiac sarcoidosis.   Wide-complex tachycardia 2021   wears LifeVest    Medications:  Scheduled:   atorvastatin  80 mg Oral Daily   bisacodyl  5 mg Oral Once   chlorhexidine  15 mL Mouth/Throat TID   [START ON 08/29/2022] chlorhexidine  15 mL Mouth/Throat Once   Chlorhexidine Gluconate Cloth  6 each Topical Daily   Chlorhexidine Gluconate Cloth  6 each Topical Once   And   Chlorhexidine Gluconate Cloth  6 each Topical Once   Chlorhexidine Gluconate Cloth  6 each Topical Once   [START ON 08/29/2022] Chlorhexidine Gluconate Cloth  6 each Topical Once   dorzolamide-timolol  1 drop Both Eyes BID   feeding supplement  237 mL Oral TID BM   folic acid  1 mg Oral Daily   latanoprost  1 drop Both Eyes QHS   mexiletine  250 mg Oral BID   mometasone-formoterol  2 puff  Inhalation BID   multivitamin with minerals  1 tablet Oral Daily   mupirocin ointment  1 Application Nasal BID   pantoprazole  40 mg Oral QAC breakfast   polyethylene glycol  17 g Oral Daily   senna-docusate  2 tablet Oral QHS   sodium chloride flush  3 mL Intravenous Q12H   sorbitol, milk of mag, mineral oil, glycerin (SMOG) enema  960 mL Rectal Once   umeclidinium bromide  1 puff Inhalation Daily   Infusions:   sodium chloride     heparin 1,200 Units/hr (08/28/22 0840)   milrinone 0.5 mcg/kg/min (08/28/22 0800)   PRN: sodium chloride, acetaminophen, acetaminophen, albuterol, ALPRAZolam, diphenhydrAMINE, HYDROcodone-acetaminophen, LORazepam, ondansetron (ZOFRAN) IV, ondansetron (ZOFRAN) IV, mouth rinse, sodium chloride flush, sodium chloride flush, temazepam  Assessment: 57 yo male on apixaban pta for afib, recent CVA 6/24 felt to be cardioembolic. Presents with volume overload > cath, work up for LVAD. Last dose of apixaban 7/25, heparin infusion started in the interim. Heparin held 8/6 am for dental extractions and restarted after.   Heparin level is therapeutic at 0.4 (aPTT good at 72), on heparin infusion at 1200 units/hr. No s/sx of bleeding or infusion issues. Remains on IABP 1:1 + on milrinone. Plan for VAD tomorrow.  Goal of Therapy:  Heparin level 0.3-0.5 units/ml  Monitor platelets by anticoagulation protocol: Yes   Plan:  Continue heparin infusion at 1200 units/hr Daily heparin level and CBC  Monitor s/sx bleeding   Thank you for allowing pharmacy to participate in this patient's care,  Sherron Monday, PharmD, BCCCP Clinical Pharmacist  Phone: [phone] 08/28/2022 11:38 AM  Please check AMION for all [facility] phone numbers After 10:00 PM, call Main Pharmacy [phone]

## 2022-08-28 NOTE — Progress Notes (Signed)
3 Days Post-Op Procedure(s) (LRB): RIGHT HEART CATH (N/A) IABP Insertion (N/A) Subjective: Patient hemodynamically stable on intra-aortic balloon pump support with adequate co-ox and cardiac output. Chest x-ray remains clear Patient receiving laxatives today for no bowel movement since dental extractions. Dental extraction sites personally reviewed inspected and are clean and healing with minimal bleeding.  Objective: Vital signs in last 24 hours: Temp:  [97.5 F (36.4 C)-99.3 F (37.4 C)] 98.2 F (36.8 C) (08/12 0815) Pulse Rate:  [76-283] 80 (08/12 0815) Cardiac Rhythm: Ventricular paced (08/12 0800) Resp:  [11-28] 19 (08/12 0815) BP: (91-135)/(55-82) 135/82 (08/12 0800) SpO2:  [89 %-97 %] 95 % (08/12 0815) Arterial Line BP: (92-127)/(47-66) 104/53 (08/12 0815) Weight:  [78.2 kg] 78.2 kg (08/12 0500)  Hemodynamic parameters for last 24 hours: PAP: (29-68)/(17-39) 31/24 CVP:  [1 mmHg-13 mmHg] 3 mmHg CO:  [4 L/min-5.3 L/min] 4.2 L/min CI:  [2.2 L/min/m2-2.9 L/min/m2] 2.27 L/min/m2  Intake/Output from previous day: 08/11 0701 - 08/12 0700 In: 541.6 [I.V.:541.6] Out: 2750 [Urine:2750] Intake/Output this shift: Total I/O In: 55.4 [I.V.:47; IV Piggyback:8.3] Out: 125 [Urine:125]       Exam    General- alert and comfortable, supine in bed due to intra-aortic balloon pump in right femoral artery    Neck- no JVD, no cervical adenopathy palpable, no carotid bruit   Lungs- clear without rales, wheezes   Cor- regular rate and rhythm, no murmur , gallop   Abdomen- soft, non-tender   Extremities - warm, non-tender, minimal edema   Neuro- oriented, appropriate, no focal weakness   Lab Results: Recent Labs    08/27/22 0446 08/28/22 0905  WBC 6.5 7.9  HGB 10.0* 10.3*  HCT 31.7* 32.8*  PLT 181 162   BMET:  Recent Labs    08/27/22 0446 08/28/22 0405  NA 133* 132*  K 4.3 4.1  CL 97* 96*  CO2 29 29  GLUCOSE 98 89  BUN 20 15  CREATININE 0.84 0.80  CALCIUM 8.6* 8.7*     PT/INR: No results for input(s): "LABPROT", "INR" in the last 72 hours. ABG    Component Value Date/Time   PHART 7.464 (H) 08/25/2022 1240   HCO3 34.2 (H) 08/25/2022 1240   TCO2 36 (H) 08/25/2022 1240   O2SAT 64.4 08/28/2022 0405   CBG (last 3)  No results for input(s): "GLUCAP" in the last 72 hours.  Assessment/Plan: S/P Procedure(s) (LRB): RIGHT HEART CATH (N/A) IABP Insertion (N/A) Acute on chronic heart failure, patient presented in cardiogenic shock with LVEF 15% and low cardiac output.  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  He has been carefully reviewed by the heart failure/mechanical cardiac support team and has been recommended for HeartMate 3 implantation but at increased risk due to his lung disease.  Recent PFTs show adequate DLCO greater than 50% with reduced FEV1 approximately 35%.  However the patient was able to ambulate 1500 feet comfortably on room air in the ICU before the balloon pump was placed [due to decreased cardiac output].  Patient has no other realistic options for hospital discharge without VAD implantation.  I have discussed the procedure of HeartMate 3 implantation in detail with the patient and his daughter.  He understands the expected benefits to include improvement in symptoms of heart failure and improvement in survival.  He understands the location of the surgical incisions, the use of general anesthesia and cardiopulmonary bypass, and the expected  and somewhat prolonged hospital recovery due to his poor preoperative cardiac output and several days of bedrest with balloon pump prior to surgery.  Plan surgery in a.m.   LOS: 18 days    Lovett Sox 08/28/2022

## 2022-08-28 NOTE — Progress Notes (Signed)
CSW met with patient at bedside to offer support. Patient in bed with IABP and unable to ambulate. Patient shared that he just had an enema and hopeful to be feeling some improvement shortly. CSW provided supportive intervention and scheduled for implant tomorrow. CSW continues to follow for supportive needs throughout implant hospitalization. Lasandra Beech, LCSW, CCSW-MCS [phone]

## 2022-08-28 NOTE — Progress Notes (Signed)
VAD coordinator note:  Met pt this morning to discuss upcoming VAD implant tomorrow. He states he is ready to get the surgery over and start to recover and feel better again. Reminded him while in ICU on IABP he will need to use his IS as much as possible. Pulled 1750 this morning.   Pt posted for VAD implant tomorrow. Spoke with pt's daughter Dorathy Daft and confirmed she will be point of contact for VAD coordinator to call tomorrow with updates. All questions answered at this time.   [name] RN VAD Coordinator  Office: [phone]  24/7 Pager: [phone]

## 2022-08-28 NOTE — Progress Notes (Signed)
Charles Holmes has been discussed with the VAD Medical Review board on 08/21/22. The team feels as if the patient is a good candidate for Destination LVAD therapy. The patient meets criteria for a LVAD implant as listed below:  1) NYHA Class: _IV_ documented on _7/25/24_(date)  2) Has a left ventricular ejection fraction (LVEF) < 25%   *EF_15%_ by echo (date) _8/8/24__  3) Must meet one of the following:   Is inotrope dependent   *On inotropes: Milrinone started 08/10/22  OR  Has a Cardiac Index (CI) < 2.2  L/min/m2 while not on inotropes:   *CI:_1.4 on right heart cath 08/10/22 (pre-Milrinone)   4) Must meet one of the following:   ___X___ Is on optimal medical management (OMM), based on current heart failure practice guidelines for at least 45 of the last 60 days and are failing to respond   ____X__ Has advanced heart failure for at least 14 days and are dependent on an intra-aortic balloon pump (IABP) or similar temporary mechanical circulatory support for at least 7 days      __X__ IABP inserted (date) _8/9/24__      ____ Impella inserted (date) ____  5)  Social work and palliative care evaluations demonstrate appropriate support system in place for discharge to home with a VAD and that end of life discussions have taken place. Both services have expressed no concern regarding patient's candidacy.         *Social work consult (date): 08/17/22        *Palliative Care Consult (date): 08/17/22  6)  Primary caretaker identified that can be taught along with the patient how to manage the VAD equipment.        *Name: Charles Holmes and Charles Holmes  7)  Deemed appropriate by our financial coordinator: Charles Holmes        Prior approval: Auth #: [id number]  8)  VAD Coordinator, Charles Pagan RN has met with patient and caregiver, shown them the VAD equipment and discussed with the patient and caregiver about lifestyle changes necessary for success on mechanical circulatory  device.        *Met with Charles Holmes on 08/11/22.       *Consent for VAD Evaluation/Caregiver Agreement/HIPPA Release/Photo Release signed on 08/11/22.   9)  Six Minute Walk: 600 ft   10) KCCQ  Pre VAD:  [facility] Cardiomyopathy Questionnaire  KCCQ-12    1 a. Ability to shower/bathe Quite a bit   1 b. Ability to walk 1 block Quite a bit   1 c. Ability to hurry/jog Extremely limited   2. Edema feet/ankles/legs 3 or more times per week but not every day   3. Limited by fatigue 3 or more times per week but not every day   4. Limited by dyspnea 3 or more times per week but not every day   5. Sitting up / on 3+ pillows Less than once a week   6. Limited enjoyment of life Quite a bit   7. Rest of life w/ symptoms Not at all satisfied   8 a. Participation in hobbies Quite a bit   8 b. Participation in chores Severely   8 c. Visiting family/friends Moderately limited       11)  Intermacs profile: 1  INTERMACS 1: Critical cardiogenic shock describes a patient who is "crashing and burning", in which a patient has life-threatening hypotension and rapidly escalating inotropic pressor support, with critical organ hypoperfusion often confirmed by worsening acidosis and  lactate levels.  INTERMACS 2: Progressive decline describes a patient who has been demonstrated "dependent" on inotropic support but nonetheless shows signs of continuing deterioration in nutrition, renal function, fluid retention, or other major status indicator. Patient profile 2 can also describe a patient with refractory volume overload, perhaps with evidence of impaired perfusion, in whom inotropic infusions cannot be maintained due to tachyarrhythmias, clinical ischemia, or other intolerance.  INTERMACS 3: Stable but inotrope dependent describes a patient who is clinically stable on mild-moderate doses of intravenous inotropes (or has a temporary circulatory support device) after repeated documentation of failure to  wean without symptomatic hypotension, worsening symptoms, or progressive organ dysfunction (usually renal). It is critical to monitor nutrition, renal function, fluid balance, and overall status carefully in order to distinguish between a   patient who is truly stable at Patient Profile 3 and a patient who has unappreciated decline rendering them Patient Profile 2. This patient may be either at home or in the hospital.      INTERMACS 4: Resting symptoms describes a patient who is at home on oral therapy but frequently has symptoms of congestion at rest or with activities of daily living (ADL). He or she may have orthopnea, shortness of breath during ADL such as dressing or bathing, gastrointestinal symptoms (abdominal discomfort, nausea, poor appetite), disabling ascites or severe lower extremity edema. This patient should be carefully considered for more intensive management and surveillance programs, which may in some cases, reveal poor compliance that would compromise outcomes with any therapy.   .   INTERMACS 5: Exertion Intolerant describes a patient who is comfortable at rest but unable to engage in any activity, living predominantly within the house or housebound. This patient has no congestive symptoms, but may have chronically elevated volume status, frequently with renal dysfunction, and may be characterized as exercise intolerant.      INTERMACS 6: Exertion Limited also describes a patient who is comfortable at rest without evidence of fluid overload, but who is able to do some mild activity. Activities of daily living are comfortable and minor activities outside the home such as visiting friends or going to a restaurant can be performed, but fatigue results within a few minutes of any meaningful physical exertion. This patient has occasional episodes of worsening symptoms and is likely to have had a hospitalization for heart failure within the past year.   INTERMACS 7: Advanced NYHA Class 3  describes a patient who is clinically stable with a reasonable level of comfortable activity, despite history of previous decompensation that is not recent. This patient is usually able to walk more than a block. Any decompensation requiring intravenous diuretics or hospitalization within the previous month should make this person a Patient Profile 6 or lower.     Charles Pagan RN VAD Coordinator  Office: [phone]  24/7 Pager: [phone]

## 2022-08-28 NOTE — TOC Progression Note (Signed)
Transition of Care [facility]) - Progression Note    Patient Details  Name: Charles Holmes MRN: [id number] Date of Birth: 01/13/1966  Transition of [facility]) CM/SW Contact  Graves-Bigelow, Lamar Laundry, RN Phone Number: 08/28/2022, 1:55 PM  Clinical Narrative: Patient was discussed in morning progression rounds. Plan for LVAD implant 08-29-22. Case Manager will continue to follow for transition of care needs as the patient progresses.      Expected Discharge Plan: IP Rehab Facility Barriers to Discharge: Continued Medical Work up  Expected Discharge Plan and Services   Discharge Planning Services: CM Consult   Living arrangements for the past 2 months: Single Family Home   Social Determinants of Health (SDOH) Interventions SDOH Screenings   Food Insecurity: No Food Insecurity (08/13/2022)  Housing: Low Risk  (08/13/2022)  Transportation Needs: No Transportation Needs (08/13/2022)  Utilities: Not At Risk (08/13/2022)  Financial Resource Strain: Low Risk  (07/12/2022)  Tobacco Use: Medium Risk (08/22/2022)    Readmission Risk Interventions     No data to display

## 2022-08-28 NOTE — Progress Notes (Addendum)
Advanced Heart Failure Rounding Note  PCP-Cardiologist: Norman Herrlich, MD   Subjective:    7/25: Admit with cardiogenic shock. Started milrinone and NE. 8/6 S/P 13 teeth extractions  8/7 Diuresed with IV lasix + metolazone.  8/8 Diuresed with IV lasix + metolazone. ECHO repeated EF 15% RV mildly reduced. RVSP 77. 8/9 IABP placed  IABP at 1:1. Continues on milrinone 0.5. CO-OX 64%.  SWAN #s PA 42/21 (31) CVP 7 CO 4.2 CI 2.27 PAPI 3   On heparin gtt. No bleeding issues. No CBC this am.   No dyspnea. Last bowel movement 08/06. Wants an enema.   Objective:   Weight Range: 78.2 kg Body mass index is 27 kg/m.   Vital Signs:   Temp:  [97.5 F (36.4 C)-99.3 F (37.4 C)] 98.1 F (36.7 C) (08/12 0645) Pulse Rate:  [76-283] 225 (08/12 0645) Resp:  [11-28] 19 (08/12 0645) BP: (91-121)/(55-81) 121/80 (08/12 0600) SpO2:  [89 %-98 %] 93 % (08/12 0645) Arterial Line BP: (92-124)/(47-66) 105/51 (08/12 0645) Weight:  [72.5 kg-78.2 kg] 78.2 kg (08/12 0500) Last BM Date : 08/23/22  Weight change: Filed Weights   08/26/22 0600 08/27/22 0715 08/28/22 0500  Weight: 72.2 kg 72.5 kg 78.2 kg    Intake/Output:   Intake/Output Summary (Last 24 hours) at 08/28/2022 0656 Last data filed at 08/28/2022 0600 Gross per 24 hour  Intake 565.14 ml  Output 2825 ml  Net -2259.86 ml   Physical Exam   General:  Lying comfortably in bed HEENT: normal Neck: supple. R internal jugular SWAN Cor: PMI nondisplaced. Regular rate & rhythm. No rubs, gallops or murmurs. Lungs: clear Abdomen: soft, nontender, nondistended.  Extremities: no cyanosis, clubbing, rash, edema, RFA IABP Neuro: alert & orientedx3. Affect pleasant    Telemetry     SR 80s with V pacing, ~ 5 PVCs/min    Labs    CBC Recent Labs    08/26/22 0504 08/27/22 0446  WBC 7.4 6.5  HGB 9.7* 10.0*  HCT 30.6* 31.7*  MCV 103.7* 107.1*  PLT 195 181    Basic Metabolic Panel Recent Labs    29/56/21 0900  08/25/22 1226 08/27/22 0446 08/28/22 0405  NA 128*   < > 133* 132*  K 3.6   < > 4.3 4.1  CL 84*   < > 97* 96*  CO2 34*   < > 29 29  GLUCOSE 146*   < > 98 89  BUN 38*   < > 20 15  CREATININE 1.13   < > 0.84 0.80  CALCIUM 9.1   < > 8.6* 8.7*  MG  --    < > 2.1 1.8  PHOS 4.4  --   --   --    < > = values in this interval not displayed.    BNP: BNP (last 3 results) Recent Labs    07/11/22 0209 07/26/22 1211 08/10/22 1142  BNP 1,588.2* 1,723.6* 1,905.0*     Imaging    No results found.   Medications:     Scheduled Medications:  atorvastatin  80 mg Oral Daily   chlorhexidine  15 mL Mouth/Throat TID   Chlorhexidine Gluconate Cloth  6 each Topical Daily   dorzolamide-timolol  1 drop Both Eyes BID   feeding supplement  237 mL Oral TID BM   folic acid  1 mg Oral Daily   latanoprost  1 drop Both Eyes QHS   mexiletine  250 mg Oral BID   mometasone-formoterol  2 puff  Inhalation BID   multivitamin with minerals  1 tablet Oral Daily   pantoprazole  40 mg Oral QAC breakfast   polyethylene glycol  17 g Oral Daily   senna-docusate  2 tablet Oral QHS   sodium chloride flush  3 mL Intravenous Q12H   sorbitol  30 mL Oral Once   umeclidinium bromide  1 puff Inhalation Daily    Infusions:  sodium chloride     heparin 1,200 Units/hr (08/28/22 0600)   milrinone 0.5 mcg/kg/min (08/28/22 0600)    PRN Medications: sodium chloride, acetaminophen, acetaminophen, albuterol, diphenhydrAMINE, HYDROcodone-acetaminophen, LORazepam, ondansetron (ZOFRAN) IV, ondansetron (ZOFRAN) IV, mouth rinse, sodium chloride flush, sodium chloride flush   Patient Profile  Charles Holmes is a 57 y.o. 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 Admitted with cardiogenic shock.   Assessment/Plan   1.  Acute on chronic Systolic HF-->Cardiogenic Shock  - Diagnosed 11/2019. Presented with VT. LHC 70% LAD  - cMRI 12/21 concerning for sarcoid and EF  18%.  - PET 2/22 at [facility] EF 25% + active sarcoid - Echo 08/26/20 EF < 20% severely dilated LV RV mildly decreased.  - CPX 8/22. Unable to complete PFTs due to hypoventilation and poor effort. pVO2 16.1 (49% predicted). Slope 19 RER: 1.17 PETCO2 59 - [employer] CRT-D upgrade in 06/08/21 - Echo 07/10/22: EF <20%, RV okay, mod pericardial effusion, mod Charles/TR - Admitted 07/25 with cardiogenic shock. - RHC: Nonobstructive CAD, severely elevated filling pressures and low Fick CO/CI (2.7/1.4) - IABP placed 8/9. Hemodynamics improved with reduction in PA pressures. CO-OX 64%. Continue IABP at 1:1 and milrinone 0.5. - Plan VAD on 08/13. D/w Dr. Donata Clay  2.  Hx of stroke -Admitted 06/24 w/ R MCA stroke. S/p TPA and mechanical clot extraction. No residual deficits  -Likely cardioembolic in setting of severe LV dysfunction. 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   - Off eliquis  - On heparin drip. Discussed dosing with PharmD personally. Check CBC today  3. 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 Continue mexilitene  - now s/p ICD.   4. CAD - LHC 12/07/19 70-% LAD, no intervention - LHC 8/24 non obstructive CAD.  - No s/s angina - Continue statin. Off aspirin.   5. Cardiac sarcoid - PET 2/22 at [facility] EF 25% + active sarcoid - Has completed prednisone.  - Will stop methotrexate with upcoming surgery.  - Will send apical core to pathology to confirm diagnosis of cardiac sarcoid  6. 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   7. AKI - suspect cardiorenal, improved w/ inotropic support - Creatinine now improved to 0.8 - follow BMP    8. Iron deficiency anemia - recent T sat 15%, scheduled for OP feraheme. Will complete inpatient    9. Pulmonary  - PFTs with severe obstructive defect, response to bronchodilator - FEV1 1.04L, FEV1/FVC 48% - Consulted pulmonary to assist with pre VAD  optimization. Appreciate assistance. - Continue IS  10. Constipation - Failed sorbitol. Repeat sorbitol X 1 then enema if needed.    Length of Stay: [address]  FINCH, LINDSAY N, PA-C  08/28/2022, 6:56 AM  Advanced Heart Failure Team Pager [phone] (M-F; 7a - 5p)  Please contact [facility] for night-coverage after hours (5p -7a ) and weekends on [email or web address]  Patient seen and examined with the above-signed  Advanced Practice Provider and/or Housestaff. I personally reviewed laboratory data, imaging studies and relevant notes. I independently examined the patient and formulated the important aspects of the plan. I have edited the note to reflect any of my changes or salient points. I have personally discussed the plan with the patient and/or family.  Remains on IABP and milrinone. Hemodynamics much improved. No bleeding with heparin.   Constipated.   General:  Lying in bed  No resp difficulty HEENT: normal Neck: supple. RIJ swan Carotids 2+ bilat; no bruits. No lymphadenopathy or thryomegaly appreciated. Cor:  Regular rate & rhythm. No rubs, gallops or murmurs. Lungs: clear Abdomen: soft, nontender, nondistended. No hepatosplenomegaly. No bruits or masses. Good bowel sounds. Extremities: no cyanosis, clubbing, rash, edema RFA IABP site ok Neuro: alert & orientedx3, cranial nerves grossly intact. moves all 4 extremities w/o difficulty. Affect pleasant  Hemodynamics much improved. Continue current care. Will adjust bowel regimen.   VAD in am. D/w TCTS.   CRITICAL CARE Performed by: Arvilla Meres  Total critical care time: 45 minutes  Critical care time was exclusive of separately billable procedures and treating other patients.  Critical care was necessary to treat or prevent imminent or life-threatening deterioration.  Critical care was time spent personally by me (independent of midlevel providers or residents) on the following activities: development of treatment plan with  patient and/or surrogate as well as nursing, discussions with consultants, evaluation of patient's response to treatment, examination of patient, obtaining history from patient or surrogate, ordering and performing treatments and interventions, ordering and review of laboratory studies, ordering and review of radiographic studies, pulse oximetry and re-evaluation of patient's condition.  Arvilla Meres, MD  8:32 AM

## 2022-08-28 NOTE — Progress Notes (Addendum)
This chaplain attempted F/U visit for notarizing Pt. HCPOA. The Pt. RN-Tonya updated the chaplain before the visit. The chaplain understands the Pt. is on the bed pan. Revisit is planned for later today.  **1125 This chaplain is present with the Pt., notary, and witnesses for notarizing of the Pt. HCPOA only. The Pt. is not completing a Living Will.   The Pt. is naming [name] as his healthcare agent.   The chaplain gave the original HCPOA to the Pt. along with one copy. The chaplain scanned the Pt. HCPOA into the Pt. EMR.  This chaplain is available for F/U spiritual care as needed.  Chaplain Stephanie Acre [phone]

## 2022-08-29 ENCOUNTER — Inpatient Hospital Stay (HOSPITAL_COMMUNITY): Payer: Medicare Other

## 2022-08-29 ENCOUNTER — Inpatient Hospital Stay (HOSPITAL_COMMUNITY): Payer: Medicare Other | Admitting: Anesthesiology

## 2022-08-29 ENCOUNTER — Other Ambulatory Visit: Payer: Self-pay

## 2022-08-29 ENCOUNTER — Inpatient Hospital Stay (HOSPITAL_COMMUNITY)
Admission: EM | Disposition: A | Discharge status: Rehabilitation Facility | Payer: Self-pay | Source: Home / Self Care | Attending: Internal Medicine

## 2022-08-29 DIAGNOSIS — I480 Paroxysmal atrial fibrillation: Secondary | ICD-10-CM

## 2022-08-29 DIAGNOSIS — Z87891 Personal history of nicotine dependence: Secondary | ICD-10-CM

## 2022-08-29 DIAGNOSIS — I5023 Acute on chronic systolic (congestive) heart failure: Secondary | ICD-10-CM

## 2022-08-29 DIAGNOSIS — I110 Hypertensive heart disease with heart failure: Secondary | ICD-10-CM

## 2022-08-29 DIAGNOSIS — R570 Cardiogenic shock: Secondary | ICD-10-CM | POA: Diagnosis not present

## 2022-08-29 DIAGNOSIS — I428 Other cardiomyopathies: Secondary | ICD-10-CM | POA: Diagnosis not present

## 2022-08-29 DIAGNOSIS — I509 Heart failure, unspecified: Secondary | ICD-10-CM | POA: Diagnosis not present

## 2022-08-29 HISTORY — PX: TEE WITHOUT CARDIOVERSION: SHX5443

## 2022-08-29 HISTORY — PX: INSERTION OF IMPLANTABLE LEFT VENTRICULAR ASSIST DEVICE: SHX5866

## 2022-08-29 HISTORY — PX: CLIPPING OF ATRIAL APPENDAGE: SHX5773

## 2022-08-29 HISTORY — PX: REMOVAL OF IMPELLA LEFT VENTRICULAR ASSIST DEVICE: SHX6556

## 2022-08-29 LAB — POCT I-STAT, CHEM 8
BUN: 12 mg/dL (ref 6–20)
BUN: 11 mg/dL (ref 6–20)
BUN: 10 mg/dL (ref 6–20)
BUN: 10 mg/dL (ref 6–20)
BUN: 11 mg/dL (ref 6–20)
BUN: 11 mg/dL (ref 6–20)
Calcium, Ion: 1.23 mmol/L (ref 1.15–1.40)
Calcium, Ion: 1.17 mmol/L (ref 1.15–1.40)
Calcium, Ion: 1.02 mmol/L — ABNORMAL LOW (ref 1.15–1.40)
Calcium, Ion: 1.01 mmol/L — ABNORMAL LOW (ref 1.15–1.40)
Calcium, Ion: 1.05 mmol/L — ABNORMAL LOW (ref 1.15–1.40)
Calcium, Ion: 1.03 mmol/L — ABNORMAL LOW (ref 1.15–1.40)
Chloride: 97 mmol/L — ABNORMAL LOW (ref 98–111)
Chloride: 98 mmol/L (ref 98–111)
Chloride: 97 mmol/L — ABNORMAL LOW (ref 98–111)
Chloride: 98 mmol/L (ref 98–111)
Chloride: 98 mmol/L (ref 98–111)
Chloride: 98 mmol/L (ref 98–111)
Creatinine, Ser: 0.6 mg/dL — ABNORMAL LOW (ref 0.61–1.24)
Creatinine, Ser: 0.7 mg/dL (ref 0.61–1.24)
Creatinine, Ser: 0.6 mg/dL — ABNORMAL LOW (ref 0.61–1.24)
Creatinine, Ser: 0.6 mg/dL — ABNORMAL LOW (ref 0.61–1.24)
Creatinine, Ser: 0.6 mg/dL — ABNORMAL LOW (ref 0.61–1.24)
Creatinine, Ser: 0.6 mg/dL — ABNORMAL LOW (ref 0.61–1.24)
Glucose, Bld: 97 mg/dL (ref 70–99)
Glucose, Bld: 111 mg/dL — ABNORMAL HIGH (ref 70–99)
Glucose, Bld: 111 mg/dL — ABNORMAL HIGH (ref 70–99)
Glucose, Bld: 124 mg/dL — ABNORMAL HIGH (ref 70–99)
Glucose, Bld: 153 mg/dL — ABNORMAL HIGH (ref 70–99)
Glucose, Bld: 121 mg/dL — ABNORMAL HIGH (ref 70–99)
HCT: 29 % — ABNORMAL LOW (ref 39.0–52.0)
HCT: 29 % — ABNORMAL LOW (ref 39.0–52.0)
HCT: 25 % — ABNORMAL LOW (ref 39.0–52.0)
HCT: 21 % — ABNORMAL LOW (ref 39.0–52.0)
HCT: 32 % — ABNORMAL LOW (ref 39.0–52.0)
HCT: 30 % — ABNORMAL LOW (ref 39.0–52.0)
Hemoglobin: 9.9 g/dL — ABNORMAL LOW (ref 13.0–17.0)
Hemoglobin: 9.9 g/dL — ABNORMAL LOW (ref 13.0–17.0)
Hemoglobin: 8.5 g/dL — ABNORMAL LOW (ref 13.0–17.0)
Hemoglobin: 10.9 g/dL — ABNORMAL LOW (ref 13.0–17.0)
Hemoglobin: 7.1 g/dL — ABNORMAL LOW (ref 13.0–17.0)
Hemoglobin: 10.2 g/dL — ABNORMAL LOW (ref 13.0–17.0)
Potassium: 4.5 mmol/L (ref 3.5–5.1)
Potassium: 4.4 mmol/L (ref 3.5–5.1)
Potassium: 4.4 mmol/L (ref 3.5–5.1)
Potassium: 4.4 mmol/L (ref 3.5–5.1)
Potassium: 4.4 mmol/L (ref 3.5–5.1)
Potassium: 3.9 mmol/L (ref 3.5–5.1)
Sodium: 135 mmol/L (ref 135–145)
Sodium: 134 mmol/L — ABNORMAL LOW (ref 135–145)
Sodium: 135 mmol/L (ref 135–145)
Sodium: 135 mmol/L (ref 135–145)
Sodium: 135 mmol/L (ref 135–145)
Sodium: 138 mmol/L (ref 135–145)
TCO2: 27 mmol/L (ref 22–32)
TCO2: 25 mmol/L (ref 22–32)
TCO2: 28 mmol/L (ref 22–32)
TCO2: 26 mmol/L (ref 22–32)
TCO2: 28 mmol/L (ref 22–32)
TCO2: 27 mmol/L (ref 22–32)

## 2022-08-29 LAB — POCT I-STAT 7, (LYTES, BLD GAS, ICA,H+H)
Acid-Base Excess: 2 mmol/L (ref 0.0–2.0)
Acid-Base Excess: 3 mmol/L — ABNORMAL HIGH (ref 0.0–2.0)
Acid-Base Excess: 2 mmol/L (ref 0.0–2.0)
Acid-Base Excess: 2 mmol/L (ref 0.0–2.0)
Acid-Base Excess: 0 mmol/L (ref 0.0–2.0)
Acid-Base Excess: 1 mmol/L (ref 0.0–2.0)
Acid-Base Excess: 0 mmol/L (ref 0.0–2.0)
Acid-base deficit: 1 mmol/L (ref 0.0–2.0)
Bicarbonate: 26.8 mmol/L (ref 20.0–28.0)
Bicarbonate: 27.3 mmol/L (ref 20.0–28.0)
Bicarbonate: 26 mmol/L (ref 20.0–28.0)
Bicarbonate: 26.5 mmol/L (ref 20.0–28.0)
Bicarbonate: 26.2 mmol/L (ref 20.0–28.0)
Bicarbonate: 26.9 mmol/L (ref 20.0–28.0)
Bicarbonate: 25.6 mmol/L (ref 20.0–28.0)
Bicarbonate: 25.6 mmol/L (ref 20.0–28.0)
Calcium, Ion: 1.25 mmol/L (ref 1.15–1.40)
Calcium, Ion: 0.95 mmol/L — ABNORMAL LOW (ref 1.15–1.40)
Calcium, Ion: 0.93 mmol/L — ABNORMAL LOW (ref 1.15–1.40)
Calcium, Ion: 1.06 mmol/L — ABNORMAL LOW (ref 1.15–1.40)
Calcium, Ion: 1.04 mmol/L — ABNORMAL LOW (ref 1.15–1.40)
Calcium, Ion: 0.96 mmol/L — ABNORMAL LOW (ref 1.15–1.40)
Calcium, Ion: 1.05 mmol/L — ABNORMAL LOW (ref 1.15–1.40)
Calcium, Ion: 1.08 mmol/L — ABNORMAL LOW (ref 1.15–1.40)
HCT: 31 % — ABNORMAL LOW (ref 39.0–52.0)
HCT: 24 % — ABNORMAL LOW (ref 39.0–52.0)
HCT: 25 % — ABNORMAL LOW (ref 39.0–52.0)
HCT: 30 % — ABNORMAL LOW (ref 39.0–52.0)
HCT: 31 % — ABNORMAL LOW (ref 39.0–52.0)
HCT: 29 % — ABNORMAL LOW (ref 39.0–52.0)
HCT: 27 % — ABNORMAL LOW (ref 39.0–52.0)
HCT: 29 % — ABNORMAL LOW (ref 39.0–52.0)
Hemoglobin: 10.5 g/dL — ABNORMAL LOW (ref 13.0–17.0)
Hemoglobin: 8.2 g/dL — ABNORMAL LOW (ref 13.0–17.0)
Hemoglobin: 8.5 g/dL — ABNORMAL LOW (ref 13.0–17.0)
Hemoglobin: 10.2 g/dL — ABNORMAL LOW (ref 13.0–17.0)
Hemoglobin: 10.5 g/dL — ABNORMAL LOW (ref 13.0–17.0)
Hemoglobin: 9.9 g/dL — ABNORMAL LOW (ref 13.0–17.0)
Hemoglobin: 9.2 g/dL — ABNORMAL LOW (ref 13.0–17.0)
Hemoglobin: 9.9 g/dL — ABNORMAL LOW (ref 13.0–17.0)
O2 Saturation: 100 %
O2 Saturation: 100 %
O2 Saturation: 100 %
O2 Saturation: 100 %
O2 Saturation: 100 %
O2 Saturation: 100 %
O2 Saturation: 100 %
O2 Saturation: 99 %
Patient temperature: 37.3
Patient temperature: 37.4
Potassium: 4.5 mmol/L (ref 3.5–5.1)
Potassium: 4.6 mmol/L (ref 3.5–5.1)
Potassium: 4.5 mmol/L (ref 3.5–5.1)
Potassium: 4.3 mmol/L (ref 3.5–5.1)
Potassium: 3.9 mmol/L (ref 3.5–5.1)
Potassium: 3.8 mmol/L (ref 3.5–5.1)
Potassium: 3.8 mmol/L (ref 3.5–5.1)
Potassium: 4.4 mmol/L (ref 3.5–5.1)
Sodium: 134 mmol/L — ABNORMAL LOW (ref 135–145)
Sodium: 136 mmol/L (ref 135–145)
Sodium: 136 mmol/L (ref 135–145)
Sodium: 136 mmol/L (ref 135–145)
Sodium: 138 mmol/L (ref 135–145)
Sodium: 138 mmol/L (ref 135–145)
Sodium: 138 mmol/L (ref 135–145)
Sodium: 137 mmol/L (ref 135–145)
TCO2: 28 mmol/L (ref 22–32)
TCO2: 29 mmol/L (ref 22–32)
TCO2: 27 mmol/L (ref 22–32)
TCO2: 28 mmol/L (ref 22–32)
TCO2: 28 mmol/L (ref 22–32)
TCO2: 28 mmol/L (ref 22–32)
TCO2: 27 mmol/L (ref 22–32)
TCO2: 27 mmol/L (ref 22–32)
pCO2 arterial: 40.5 mmHg (ref 32–48)
pCO2 arterial: 42 mmHg (ref 32–48)
pCO2 arterial: 38.9 mmHg (ref 32–48)
pCO2 arterial: 39.2 mmHg (ref 32–48)
pCO2 arterial: 46.7 mmHg (ref 32–48)
pCO2 arterial: 47.8 mmHg (ref 32–48)
pCO2 arterial: 51.4 mmHg — ABNORMAL HIGH (ref 32–48)
pCO2 arterial: 47.9 mmHg (ref 32–48)
pH, Arterial: 7.429 (ref 7.35–7.45)
pH, Arterial: 7.422 (ref 7.35–7.45)
pH, Arterial: 7.433 (ref 7.35–7.45)
pH, Arterial: 7.439 (ref 7.35–7.45)
pH, Arterial: 7.356 (ref 7.35–7.45)
pH, Arterial: 7.359 (ref 7.35–7.45)
pH, Arterial: 7.307 — ABNORMAL LOW (ref 7.35–7.45)
pH, Arterial: 7.337 — ABNORMAL LOW (ref 7.35–7.45)
pO2, Arterial: 278 mmHg — ABNORMAL HIGH (ref 83–108)
pO2, Arterial: 361 mmHg — ABNORMAL HIGH (ref 83–108)
pO2, Arterial: 383 mmHg — ABNORMAL HIGH (ref 83–108)
pO2, Arterial: 421 mmHg — ABNORMAL HIGH (ref 83–108)
pO2, Arterial: 495 mmHg — ABNORMAL HIGH (ref 83–108)
pO2, Arterial: 484 mmHg — ABNORMAL HIGH (ref 83–108)
pO2, Arterial: 446 mmHg — ABNORMAL HIGH (ref 83–108)
pO2, Arterial: 164 mmHg — ABNORMAL HIGH (ref 83–108)

## 2022-08-29 LAB — BASIC METABOLIC PANEL
Anion gap: 8 (ref 5–15)
Anion gap: 10 (ref 5–15)
BUN: 9 mg/dL (ref 6–20)
BUN: 11 mg/dL (ref 6–20)
CO2: 27 mmol/L (ref 22–32)
CO2: 21 mmol/L — ABNORMAL LOW (ref 22–32)
Calcium: 7.2 mg/dL — ABNORMAL LOW (ref 8.9–10.3)
Calcium: 7.7 mg/dL — ABNORMAL LOW (ref 8.9–10.3)
Chloride: 103 mmol/L (ref 98–111)
Chloride: 101 mmol/L (ref 98–111)
Creatinine, Ser: 0.79 mg/dL (ref 0.61–1.24)
Creatinine, Ser: 0.9 mg/dL (ref 0.61–1.24)
GFR, Estimated: >60 mL/min (ref >60)
GFR, Estimated: >60 mL/min (ref >60)
Glucose, Bld: 91 mg/dL (ref 70–99)
Glucose, Bld: 137 mg/dL — ABNORMAL HIGH (ref 70–99)
Potassium: 3.6 mmol/L (ref 3.5–5.1)
Potassium: 4.4 mmol/L (ref 3.5–5.1)
Sodium: 138 mmol/L (ref 135–145)
Sodium: 132 mmol/L — ABNORMAL LOW (ref 135–145)

## 2022-08-29 LAB — PREPARE FRESH FROZEN PLASMA: Unit division: 0

## 2022-08-29 LAB — BPAM FFP
Blood Product Expiration Date: 202408142359
Blood Product Expiration Date: 202408142359
Blood Product Expiration Date: 202408142359
Blood Product Expiration Date: 202408182359
ISSUE DATE / TIME: 202408130946
ISSUE DATE / TIME: 202408130946
ISSUE DATE / TIME: 202408130946
ISSUE DATE / TIME: 202408130946
Unit Type and Rh: 600
Unit Type and Rh: 6200
Unit Type and Rh: 6200
Unit Type and Rh: 6200

## 2022-08-29 LAB — PREPARE PLATELET PHERESIS
Unit division: 0
Unit division: 0

## 2022-08-29 LAB — BPAM PLATELET PHERESIS
Blood Product Expiration Date: 202408152359
Blood Product Expiration Date: 202408152359
ISSUE DATE / TIME: 202408131137
ISSUE DATE / TIME: 202408131137
Unit Type and Rh: 5100
Unit Type and Rh: 600

## 2022-08-29 LAB — CBC
HCT: 28.2 % — ABNORMAL LOW (ref 39.0–52.0)
HCT: 28.2 % — ABNORMAL LOW (ref 39.0–52.0)
Hemoglobin: 9.1 g/dL — ABNORMAL LOW (ref 13.0–17.0)
Hemoglobin: 9 g/dL — ABNORMAL LOW (ref 13.0–17.0)
MCH: 32.2 pg (ref 26.0–34.0)
MCH: 31.8 pg (ref 26.0–34.0)
MCHC: 32.3 g/dL (ref 30.0–36.0)
MCHC: 31.9 g/dL (ref 30.0–36.0)
MCV: 99.6 fL (ref 80.0–100.0)
MCV: 99.6 fL (ref 80.0–100.0)
Platelets: 143 10*3/uL — ABNORMAL LOW (ref 150–400)
Platelets: 163 10*3/uL (ref 150–400)
RBC: 2.83 MIL/uL — ABNORMAL LOW (ref 4.22–5.81)
RBC: 2.83 MIL/uL — ABNORMAL LOW (ref 4.22–5.81)
RDW: 17.2 % — ABNORMAL HIGH (ref 11.5–15.5)
RDW: 17.3 % — ABNORMAL HIGH (ref 11.5–15.5)
WBC: 12.5 10*3/uL — ABNORMAL HIGH (ref 4.0–10.5)
WBC: 11 10*3/uL — ABNORMAL HIGH (ref 4.0–10.5)
nRBC: 0 % (ref 0.0–0.2)
nRBC: 0 % (ref 0.0–0.2)

## 2022-08-29 LAB — POCT I-STAT EG7
Acid-Base Excess: 4 mmol/L — ABNORMAL HIGH (ref 0.0–2.0)
Acid-Base Excess: 1 mmol/L (ref 0.0–2.0)
Bicarbonate: 28.6 mmol/L — ABNORMAL HIGH (ref 20.0–28.0)
Bicarbonate: 28 mmol/L (ref 20.0–28.0)
Calcium, Ion: 1.03 mmol/L — ABNORMAL LOW (ref 1.15–1.40)
Calcium, Ion: 0.97 mmol/L — ABNORMAL LOW (ref 1.15–1.40)
HCT: 24 % — ABNORMAL LOW (ref 39.0–52.0)
HCT: 27 % — ABNORMAL LOW (ref 39.0–52.0)
Hemoglobin: 8.2 g/dL — ABNORMAL LOW (ref 13.0–17.0)
Hemoglobin: 9.2 g/dL — ABNORMAL LOW (ref 13.0–17.0)
O2 Saturation: 82 %
O2 Saturation: 70 %
Patient temperature: 37.3
Potassium: 4.2 mmol/L (ref 3.5–5.1)
Potassium: 3.7 mmol/L (ref 3.5–5.1)
Sodium: 136 mmol/L (ref 135–145)
Sodium: 141 mmol/L (ref 135–145)
TCO2: 30 mmol/L (ref 22–32)
TCO2: 30 mmol/L (ref 22–32)
pCO2, Ven: 45.4 mmHg (ref 44–60)
pCO2, Ven: 55.3 mmHg (ref 44–60)
pH, Ven: 7.408 (ref 7.25–7.43)
pH, Ven: 7.314 (ref 7.25–7.43)
pO2, Ven: 46 mmHg — ABNORMAL HIGH (ref 32–45)
pO2, Ven: 41 mmHg (ref 32–45)

## 2022-08-29 LAB — PREPARE CRYOPRECIPITATE: Unit division: 0

## 2022-08-29 LAB — BPAM CRYOPRECIPITATE
Blood Product Expiration Date: 202408142359
ISSUE DATE / TIME: 202408131136
Unit Type and Rh: 5100

## 2022-08-29 LAB — GLUCOSE, CAPILLARY
Glucose-Capillary: 85 mg/dL (ref 70–99)
Glucose-Capillary: 98 mg/dL (ref 70–99)
Glucose-Capillary: 107 mg/dL — ABNORMAL HIGH (ref 70–99)
Glucose-Capillary: 111 mg/dL — ABNORMAL HIGH (ref 70–99)
Glucose-Capillary: 113 mg/dL — ABNORMAL HIGH (ref 70–99)
Glucose-Capillary: 144 mg/dL — ABNORMAL HIGH (ref 70–99)
Glucose-Capillary: 156 mg/dL — ABNORMAL HIGH (ref 70–99)

## 2022-08-29 LAB — COOXEMETRY PANEL
Carboxyhemoglobin: 2.8 % — ABNORMAL HIGH (ref 0.5–1.5)
Carboxyhemoglobin: 2.7 % — ABNORMAL HIGH (ref 0.5–1.5)
Methemoglobin: 0.7 % (ref 0.0–1.5)
Methemoglobin: 2 % — ABNORMAL HIGH (ref 0.0–1.5)
O2 Saturation: 100 %
O2 Saturation: 66.1 %
Total hemoglobin: 9.5 g/dL — ABNORMAL LOW (ref 12.0–16.0)
Total hemoglobin: 9.9 g/dL — ABNORMAL LOW (ref 12.0–16.0)

## 2022-08-29 LAB — HEMOGLOBIN AND HEMATOCRIT, BLOOD
HCT: 22.4 % — ABNORMAL LOW (ref 39.0–52.0)
Hemoglobin: 7.1 g/dL — ABNORMAL LOW (ref 13.0–17.0)

## 2022-08-29 LAB — DIC (DISSEMINATED INTRAVASCULAR COAGULATION)PANEL
D-Dimer, Quant: 3.3 ug/mL-FEU — ABNORMAL HIGH (ref 0.00–0.50)
Fibrinogen: 386 mg/dL (ref 210–475)
INR: 1.4 — ABNORMAL HIGH (ref 0.8–1.2)
Platelets: 144 10*3/uL — ABNORMAL LOW (ref 150–400)
Prothrombin Time: 17.2 seconds — ABNORMAL HIGH (ref 11.4–15.2)
Smear Review: NONE SEEN
aPTT: 34 seconds (ref 24–36)

## 2022-08-29 LAB — FIBRINOGEN: Fibrinogen: 424 mg/dL (ref 210–475)

## 2022-08-29 LAB — MAGNESIUM
Magnesium: 1.5 mg/dL — ABNORMAL LOW (ref 1.7–2.4)
Magnesium: 2.6 mg/dL — ABNORMAL HIGH (ref 1.7–2.4)

## 2022-08-29 LAB — PLATELET COUNT: Platelets: 90 10*3/uL — ABNORMAL LOW (ref 150–400)

## 2022-08-29 SURGERY — INSERTION OF IMPLANTABLE LEFT VENTRICULAR ASSIST DEVICE
Anesthesia: General | Site: Chest

## 2022-08-29 MED ORDER — PHENYLEPHRINE 80 MCG/ML (10ML) SYRINGE FOR IV PUSH (FOR BLOOD PRESSURE SUPPORT)
PREFILLED_SYRINGE | INTRAVENOUS | Status: DC | PRN
  Administered 2022-08-29 (×2): 80 ug via INTRAVENOUS

## 2022-08-29 MED ORDER — OXYCODONE HCL 5 MG PO TABS: 5.0000 mg | ORAL_TABLET | ORAL | Status: DC | PRN

## 2022-08-29 MED ORDER — MEXILETINE HCL 250 MG PO CAPS
250.0000 mg | ORAL_CAPSULE | Freq: Two times a day (BID) | ORAL | Status: DC
  Administered 2022-08-29 – 2022-09-08 (×19): 250 mg
  Filled 2022-08-29 (×21): qty 1

## 2022-08-29 MED ORDER — ALBUMIN HUMAN 5 % IV SOLN
250.0000 mL | INTRAVENOUS | Status: AC | PRN
  Administered 2022-08-29 – 2022-08-30 (×3): 12.5 g via INTRAVENOUS
  Filled 2022-08-29: qty 250

## 2022-08-29 MED ORDER — ROCURONIUM BROMIDE 10 MG/ML (PF) SYRINGE
PREFILLED_SYRINGE | INTRAVENOUS | Status: AC
  Filled 2022-08-29: qty 10

## 2022-08-29 MED ORDER — MIDAZOLAM HCL 2 MG/2ML IJ SOLN
2.0000 mg | INTRAMUSCULAR | Status: DC | PRN
  Administered 2022-08-29: 2 mg via INTRAVENOUS
  Filled 2022-08-29: qty 2

## 2022-08-29 MED ORDER — LACTATED RINGERS IV SOLN: INTRAVENOUS | Status: DC

## 2022-08-29 MED ORDER — TRAMADOL HCL 50 MG PO TABS
50.0000 mg | ORAL_TABLET | ORAL | Status: DC | PRN
  Administered 2022-09-01 – 2022-09-04 (×5): 100 mg via ORAL
  Filled 2022-08-29 (×6): qty 2

## 2022-08-29 MED ORDER — SODIUM CHLORIDE 0.9 % IV SOLN: 250.0000 mL | INTRAVENOUS | Status: DC

## 2022-08-29 MED ORDER — NOREPINEPHRINE BITARTRATE 1 MG/ML IV SOLN
INTRAVENOUS | Status: DC | PRN
  Administered 2022-08-29 (×2): .5 mL via INTRAVENOUS

## 2022-08-29 MED ORDER — SODIUM CHLORIDE 0.9% FLUSH: 3.0000 mL | INTRAVENOUS | Status: DC | PRN

## 2022-08-29 MED ORDER — VANCOMYCIN HCL 1000 MG IV SOLR
INTRAVENOUS | Status: AC
  Filled 2022-08-29: qty 20

## 2022-08-29 MED ORDER — POTASSIUM CHLORIDE 10 MEQ/50ML IV SOLN
10.0000 meq | INTRAVENOUS | Status: AC
  Administered 2022-08-29 (×3): 10 meq via INTRAVENOUS

## 2022-08-29 MED ORDER — ASPIRIN 325 MG PO TBEC: 325.0000 mg | DELAYED_RELEASE_TABLET | Freq: Every day | ORAL | Status: DC

## 2022-08-29 MED ORDER — BISACODYL 5 MG PO TBEC
10.0000 mg | DELAYED_RELEASE_TABLET | Freq: Every day | ORAL | Status: DC
  Administered 2022-08-31: 10 mg via ORAL
  Filled 2022-08-29: qty 2

## 2022-08-29 MED ORDER — DEXMEDETOMIDINE HCL IN NACL 400 MCG/100ML IV SOLN
0.0000 ug/kg/h | INTRAVENOUS | Status: DC
  Administered 2022-08-30 (×2): 0.7 ug/kg/h via INTRAVENOUS
  Administered 2022-08-31: 0.4 ug/kg/h via INTRAVENOUS
  Filled 2022-08-29: qty 200
  Filled 2022-08-29 (×3): qty 100

## 2022-08-29 MED ORDER — ORAL CARE MOUTH RINSE: 15.0000 mL | OROMUCOSAL | Status: DC | PRN

## 2022-08-29 MED ORDER — CALCIUM CHLORIDE 10 % IV SOLN
INTRAVENOUS | Status: DC | PRN
Start: 2022-08-29 — End: 2022-08-29
  Administered 2022-08-29: 100 mg via INTRAVENOUS

## 2022-08-29 MED ORDER — PROPOFOL 10 MG/ML IV BOLUS
INTRAVENOUS | Status: AC
  Filled 2022-08-29: qty 20

## 2022-08-29 MED ORDER — CHLORHEXIDINE GLUCONATE CLOTH 2 % EX PADS
6.0000 | MEDICATED_PAD | Freq: Every day | CUTANEOUS | Status: DC
  Administered 2022-08-30 – 2022-09-22 (×25): 6 via TOPICAL

## 2022-08-29 MED ORDER — 0.9 % SODIUM CHLORIDE (POUR BTL) OPTIME
TOPICAL | Status: DC | PRN
  Administered 2022-08-29: 1000 mL
  Administered 2022-08-29: 5000 mL
  Administered 2022-08-29: 1000 mL

## 2022-08-29 MED ORDER — LACTATED RINGERS IV SOLN: 500.0000 mL | Freq: Once | INTRAVENOUS | Status: DC | PRN

## 2022-08-29 MED ORDER — LACTATED RINGERS IV SOLN: INTRAVENOUS | Status: DC | PRN

## 2022-08-29 MED ORDER — SODIUM CHLORIDE 0.9 % IV SOLN
600.0000 mg | Freq: Once | INTRAVENOUS | Status: AC
  Administered 2022-08-30: 600 mg via INTRAVENOUS
  Filled 2022-08-29: qty 10

## 2022-08-29 MED ORDER — MIDAZOLAM HCL 2 MG/2ML IJ SOLN
INTRAMUSCULAR | Status: AC
  Administered 2022-08-29: 2 mg via INTRAVENOUS
  Filled 2022-08-29: qty 2

## 2022-08-29 MED ORDER — DEXTROSE 50 % IV SOLN
0.0000 mL | INTRAVENOUS | Status: DC | PRN
  Administered 2022-09-23: 50 mL via INTRAVENOUS
  Filled 2022-08-29: qty 50

## 2022-08-29 MED ORDER — MIDAZOLAM HCL (PF) 10 MG/2ML IJ SOLN
INTRAMUSCULAR | Status: AC
  Filled 2022-08-29: qty 2

## 2022-08-29 MED ORDER — ASPIRIN 300 MG RE SUPP: 300.0000 mg | Freq: Every day | RECTAL | Status: DC

## 2022-08-29 MED ORDER — PHENYLEPHRINE 80 MCG/ML (10ML) SYRINGE FOR IV PUSH (FOR BLOOD PRESSURE SUPPORT)
PREFILLED_SYRINGE | INTRAVENOUS | Status: AC
  Filled 2022-08-29: qty 10

## 2022-08-29 MED ORDER — ACETAMINOPHEN 160 MG/5ML PO SOLN
650.0000 mg | Freq: Once | ORAL | Status: AC
  Administered 2022-08-29: 650 mg
  Filled 2022-08-29: qty 20.3

## 2022-08-29 MED ORDER — EPINEPHRINE HCL 5 MG/250ML IV SOLN IN NS
0.0000 ug/min | INTRAVENOUS | Status: DC
  Administered 2022-08-30: 4.5 ug/min via INTRAVENOUS
  Administered 2022-08-30: 6 ug/min via INTRAVENOUS
  Administered 2022-08-31: 5 ug/min via INTRAVENOUS
  Administered 2022-09-01 – 2022-09-02 (×2): 3 ug/min via INTRAVENOUS
  Administered 2022-09-03 – 2022-09-04 (×2): 4 ug/min via INTRAVENOUS
  Administered 2022-09-05: 6 ug/min via INTRAVENOUS
  Administered 2022-09-05: 5 ug/min via INTRAVENOUS
  Administered 2022-09-06: 6 ug/min via INTRAVENOUS
  Administered 2022-09-07 (×2): 8 ug/min via INTRAVENOUS
  Administered 2022-09-07: 9 ug/min via INTRAVENOUS
  Administered 2022-09-08: 8.5 ug/min via INTRAVENOUS
  Administered 2022-09-08: 8 ug/min via INTRAVENOUS
  Administered 2022-09-09: 6 ug/min via INTRAVENOUS
  Administered 2022-09-09: 8.5 ug/min via INTRAVENOUS
  Administered 2022-09-10: 5 ug/min via INTRAVENOUS
  Administered 2022-09-11: 4 ug/min via INTRAVENOUS
  Administered 2022-09-12: 3 ug/min via INTRAVENOUS
  Administered 2022-09-14: 2.5 ug/min via INTRAVENOUS
  Filled 2022-08-29 (×21): qty 250

## 2022-08-29 MED ORDER — MAGNESIUM SULFATE 4 GM/100ML IV SOLN
4.0000 g | Freq: Once | INTRAVENOUS | Status: AC
  Administered 2022-08-29: 4 g via INTRAVENOUS
  Filled 2022-08-29: qty 100

## 2022-08-29 MED ORDER — HEPARIN SODIUM (PORCINE) 1000 UNIT/ML IJ SOLN
INTRAMUSCULAR | Status: AC
  Filled 2022-08-29: qty 1

## 2022-08-29 MED ORDER — FAMOTIDINE IN NACL 20-0.9 MG/50ML-% IV SOLN
20.0000 mg | Freq: Two times a day (BID) | INTRAVENOUS | Status: AC
  Administered 2022-08-29 – 2022-08-30 (×2): 20 mg via INTRAVENOUS
  Filled 2022-08-29 (×2): qty 50

## 2022-08-29 MED ORDER — SODIUM CHLORIDE 0.9 % IV SOLN
20.0000 ug | Freq: Once | INTRAVENOUS | Status: AC
  Administered 2022-08-29: 20 ug via INTRAVENOUS
  Filled 2022-08-29: qty 5

## 2022-08-29 MED ORDER — FENTANYL CITRATE (PF) 250 MCG/5ML IJ SOLN
INTRAMUSCULAR | Status: AC
  Filled 2022-08-29: qty 5

## 2022-08-29 MED ORDER — FLUCONAZOLE IN SODIUM CHLORIDE 400-0.9 MG/200ML-% IV SOLN
400.0000 mg | Freq: Once | INTRAVENOUS | Status: DC
  Filled 2022-08-29: qty 200

## 2022-08-29 MED ORDER — HEMOSTATIC AGENTS (NO CHARGE) OPTIME
TOPICAL | Status: DC | PRN
Start: 2022-08-29 — End: 2022-08-29
  Administered 2022-08-29 (×5): 1 via TOPICAL

## 2022-08-29 MED ORDER — LIDOCAINE 2% (20 MG/ML) 5 ML SYRINGE
INTRAMUSCULAR | Status: AC
  Filled 2022-08-29: qty 5

## 2022-08-29 MED ORDER — SODIUM CHLORIDE (PF) 0.9 % IJ SOLN: OROMUCOSAL | Status: DC | PRN

## 2022-08-29 MED ORDER — SODIUM CHLORIDE 0.9 % IV SOLN: INTRAVENOUS | Status: DC

## 2022-08-29 MED ORDER — ROCURONIUM BROMIDE 10 MG/ML (PF) SYRINGE
PREFILLED_SYRINGE | INTRAVENOUS | Status: DC | PRN
  Administered 2022-08-29 (×5): 50 mg via INTRAVENOUS

## 2022-08-29 MED ORDER — VANCOMYCIN HCL IN DEXTROSE 1-5 GM/200ML-% IV SOLN
1000.0000 mg | Freq: Two times a day (BID) | INTRAVENOUS | Status: AC
  Administered 2022-08-29 – 2022-08-30 (×3): 1000 mg via INTRAVENOUS
  Filled 2022-08-29 (×3): qty 200

## 2022-08-29 MED ORDER — ONDANSETRON HCL 4 MG/2ML IJ SOLN: 4.0000 mg | Freq: Four times a day (QID) | INTRAMUSCULAR | Status: DC | PRN

## 2022-08-29 MED ORDER — VANCOMYCIN HCL 1000 MG IV SOLR
INTRAVENOUS | Status: DC | PRN
  Administered 2022-08-29: 2000 mg

## 2022-08-29 MED ORDER — ACETAMINOPHEN 650 MG RE SUPP: 650.0000 mg | Freq: Once | RECTAL | Status: AC

## 2022-08-29 MED ORDER — INSULIN REGULAR(HUMAN) IN NACL 100-0.9 UT/100ML-% IV SOLN: INTRAVENOUS | Status: DC

## 2022-08-29 MED ORDER — EPHEDRINE 5 MG/ML INJ
INTRAVENOUS | Status: AC
  Filled 2022-08-29: qty 5

## 2022-08-29 MED ORDER — DEXMEDETOMIDINE HCL IN NACL 400 MCG/100ML IV SOLN
INTRAVENOUS | Status: AC
  Filled 2022-08-29: qty 100

## 2022-08-29 MED ORDER — ACETAMINOPHEN 160 MG/5ML PO SOLN
1000.0000 mg | Freq: Four times a day (QID) | ORAL | Status: AC
  Administered 2022-08-29 – 2022-09-03 (×14): 1000 mg
  Filled 2022-08-29 (×14): qty 40.6

## 2022-08-29 MED ORDER — PANTOPRAZOLE SODIUM 40 MG PO TBEC: 40.0000 mg | DELAYED_RELEASE_TABLET | Freq: Every day | ORAL | Status: DC

## 2022-08-29 MED ORDER — BISACODYL 10 MG RE SUPP: 10.0000 mg | Freq: Every day | RECTAL | Status: DC

## 2022-08-29 MED ORDER — SODIUM CHLORIDE 0.45 % IV SOLN: INTRAVENOUS | Status: DC | PRN

## 2022-08-29 MED ORDER — CEFAZOLIN SODIUM-DEXTROSE 2-4 GM/100ML-% IV SOLN
2.0000 g | Freq: Three times a day (TID) | INTRAVENOUS | Status: AC
  Administered 2022-08-29 – 2022-08-31 (×6): 2 g via INTRAVENOUS
  Filled 2022-08-29 (×6): qty 100

## 2022-08-29 MED ORDER — ETOMIDATE 2 MG/ML IV SOLN
INTRAVENOUS | Status: DC | PRN
Start: 2022-08-29 — End: 2022-08-29
  Administered 2022-08-29: 16 mg via INTRAVENOUS

## 2022-08-29 MED ORDER — MORPHINE SULFATE (PF) 2 MG/ML IV SOLN
1.0000 mg | INTRAVENOUS | Status: DC | PRN
  Administered 2022-08-29 – 2022-08-31 (×4): 2 mg via INTRAVENOUS
  Filled 2022-08-29 (×4): qty 1

## 2022-08-29 MED ORDER — MIDAZOLAM HCL 2 MG/2ML IJ SOLN
INTRAMUSCULAR | Status: AC
  Filled 2022-08-29: qty 2

## 2022-08-29 MED ORDER — ASPIRIN 81 MG PO CHEW
324.0000 mg | CHEWABLE_TABLET | Freq: Every day | ORAL | Status: DC
  Administered 2022-08-30 – 2022-09-02 (×4): 324 mg
  Filled 2022-08-29 (×4): qty 4

## 2022-08-29 MED ORDER — NOREPINEPHRINE 4 MG/250ML-% IV SOLN
0.0000 ug/min | INTRAVENOUS | Status: DC
  Administered 2022-08-30: 2 ug/min via INTRAVENOUS
  Filled 2022-08-29: qty 250

## 2022-08-29 MED ORDER — HEPARIN SODIUM (PORCINE) 1000 UNIT/ML IJ SOLN
INTRAMUSCULAR | Status: DC | PRN
  Administered 2022-08-29: 31000 [IU] via INTRAVENOUS

## 2022-08-29 MED ORDER — METOCLOPRAMIDE HCL 5 MG/ML IJ SOLN
10.0000 mg | Freq: Four times a day (QID) | INTRAMUSCULAR | Status: AC
  Administered 2022-08-29 – 2022-09-03 (×19): 10 mg via INTRAVENOUS
  Filled 2022-08-29 (×19): qty 2

## 2022-08-29 MED ORDER — DOCUSATE SODIUM 100 MG PO CAPS: 200.0000 mg | ORAL_CAPSULE | Freq: Every day | ORAL | Status: DC

## 2022-08-29 MED ORDER — ORAL CARE MOUTH RINSE
15.0000 mL | OROMUCOSAL | Status: DC
  Administered 2022-08-29 – 2022-08-31 (×19): 15 mL via OROMUCOSAL

## 2022-08-29 MED ORDER — SODIUM CHLORIDE 0.9% FLUSH
3.0000 mL | Freq: Two times a day (BID) | INTRAVENOUS | Status: DC
  Administered 2022-08-30 – 2022-09-05 (×9): 3 mL via INTRAVENOUS

## 2022-08-29 MED ORDER — FLUCONAZOLE IN SODIUM CHLORIDE 200-0.9 MG/100ML-% IV SOLN
200.0000 mg | INTRAVENOUS | Status: AC
  Administered 2022-08-30: 200 mg via INTRAVENOUS
  Filled 2022-08-29 (×2): qty 100

## 2022-08-29 MED ORDER — MIDAZOLAM HCL (PF) 5 MG/ML IJ SOLN
INTRAMUSCULAR | Status: DC | PRN
  Administered 2022-08-29 (×2): 2 mg via INTRAVENOUS
  Administered 2022-08-29: 1 mg via INTRAVENOUS
  Administered 2022-08-29: 2 mg via INTRAVENOUS
  Administered 2022-08-29: 1 mg via INTRAVENOUS
  Administered 2022-08-29 (×2): 2 mg via INTRAVENOUS

## 2022-08-29 MED ORDER — CHLORHEXIDINE GLUCONATE 0.12 % MT SOLN
15.0000 mL | OROMUCOSAL | Status: DC
  Filled 2022-08-29: qty 15

## 2022-08-29 MED ORDER — PROTAMINE SULFATE 10 MG/ML IV SOLN
INTRAVENOUS | Status: DC | PRN
  Administered 2022-08-29: 20 mg via INTRAVENOUS
  Administered 2022-08-29: 280 mg via INTRAVENOUS

## 2022-08-29 MED ORDER — FENTANYL CITRATE (PF) 250 MCG/5ML IJ SOLN
INTRAMUSCULAR | Status: DC | PRN
  Administered 2022-08-29 (×3): 50 ug via INTRAVENOUS
  Administered 2022-08-29: 250 ug via INTRAVENOUS
  Administered 2022-08-29: 100 ug via INTRAVENOUS
  Administered 2022-08-29 (×2): 50 ug via INTRAVENOUS
  Administered 2022-08-29: 150 ug via INTRAVENOUS
  Administered 2022-08-29: 50 ug via INTRAVENOUS
  Administered 2022-08-29: 100 ug via INTRAVENOUS
  Administered 2022-08-29 (×2): 50 ug via INTRAVENOUS

## 2022-08-29 MED ORDER — PHENYLEPHRINE HCL-NACL 20-0.9 MG/250ML-% IV SOLN
INTRAVENOUS | Status: AC
  Filled 2022-08-29: qty 250

## 2022-08-29 MED ORDER — SODIUM CHLORIDE (PF) 0.9 % IJ SOLN
INTRAMUSCULAR | Status: DC | PRN
  Administered 2022-08-29: 1000 mL via INTRAVENOUS

## 2022-08-29 MED ORDER — ACETAMINOPHEN 500 MG PO TABS
1000.0000 mg | ORAL_TABLET | Freq: Four times a day (QID) | ORAL | Status: AC
  Administered 2022-09-02: 1000 mg via ORAL
  Filled 2022-08-29 (×2): qty 2

## 2022-08-29 SURGICAL SUPPLY — 115 items
ADAPTER CARDIO PERF ANTE/RETRO (ADAPTER) IMPLANT
ADPR PRFSN 84XANTGRD RTRGD (ADAPTER)
AGENT HMST MTR 8 SURGIFLO (HEMOSTASIS) ×3
APL PRP STRL LF DISP 70% ISPRP (MISCELLANEOUS) ×3
ATRICLIP EXCLUSION VLAA 45 (Miscellaneous) ×3 IMPLANT
BAG DECANTER FOR FLEXI CONT (MISCELLANEOUS) ×3 IMPLANT
BLADE CLIPPER SURG (BLADE) ×3 IMPLANT
BLADE STERNUM SYSTEM 6 (BLADE) ×3 IMPLANT
BLADE SURG 12 STRL SS (BLADE) ×3 IMPLANT
BLADE SURG 15 STRL LF DISP TIS (BLADE) IMPLANT
BLADE SURG 15 STRL SS (BLADE)
CANISTER SUCT 3000ML PPV (MISCELLANEOUS) ×3 IMPLANT
CANNULA AORTIC ROOT 9FR (CANNULA) IMPLANT
CANNULA ARTERIAL NVNT 3/8 20FR (MISCELLANEOUS) ×3 IMPLANT
CANNULA MC2 2 STG 36/46 NON-V (CANNULA) IMPLANT
CANNULA SUMP PERICARDIAL (CANNULA) ×3 IMPLANT
CATH FOLEY 2WAY SLVR 5CC 14FR (CATHETERS) ×3 IMPLANT
CATH FOLEY 2WAY SLVR 5CC 16FR (CATHETERS) IMPLANT
CATH HEART VENT LEFT (CATHETERS) IMPLANT
CATH ROBINSON RED A/P 18FR (CATHETERS) ×6 IMPLANT
CATH THORACIC 28FR (CATHETERS) IMPLANT
CATH THORACIC 28FR RT ANG (CATHETERS) IMPLANT
CHLORAPREP W/TINT 26 (MISCELLANEOUS) ×3 IMPLANT
CNTNR URN SCR LID CUP LEK RST (MISCELLANEOUS) IMPLANT
CONN ST 1/4X3/8 BEN (MISCELLANEOUS) IMPLANT
CONT SPEC 4OZ STRL OR WHT (MISCELLANEOUS) ×3
CONTAINER PROTECT SURGISLUSH (MISCELLANEOUS) ×6 IMPLANT
DEVICE EXCLUSIN ATRCLP VLAA 45 (Miscellaneous) IMPLANT
DRAIN CHANNEL 28F RND 3/8 FF (WOUND CARE) IMPLANT
DRAIN CHANNEL 32F RND 10.7 FF (WOUND CARE) IMPLANT
DRAPE CV SPLIT W-CLR ANES SCRN (DRAPES) ×3 IMPLANT
DRAPE INCISE IOBAN 66X45 STRL (DRAPES) ×6 IMPLANT
DRAPE PERI GROIN 82X75IN TIB (DRAPES) ×3 IMPLANT
DRAPE SLUSH/WARMER DISC (DRAPES) ×3 IMPLANT
DRSG AQUACEL AG ADV 3.5X 6 (GAUZE/BANDAGES/DRESSINGS) IMPLANT
DRSG AQUACEL AG ADV 3.5X14 (GAUZE/BANDAGES/DRESSINGS) ×3 IMPLANT
ELECT BLADE 4.0 EZ CLEAN MEGAD (MISCELLANEOUS) ×3 IMPLANT
ELECT BLADE 6.5 EXT (BLADE) ×3 IMPLANT
ELECT CAUTERY BLADE 6.4 (BLADE) ×3 IMPLANT
ELECT REM PT RETURN 9FT ADLT (ELECTROSURGICAL) ×3 IMPLANT
ELECTRODE BLDE 4.0 EZ CLN MEGD (MISCELLANEOUS) ×3 IMPLANT
ELECTRODE REM PT RTRN 9FT ADLT (ELECTROSURGICAL) ×3 IMPLANT
FELT TEFLON 1X6 (MISCELLANEOUS) IMPLANT
FELT TEFLON 6X6 (MISCELLANEOUS) ×3 IMPLANT
GAUZE SPONGE 4X4 12PLY STRL (GAUZE/BANDAGES/DRESSINGS) ×6 IMPLANT
GLOVE BIO SURGEON STRL SZ 6 (GLOVE) IMPLANT
GLOVE BIO SURGEON STRL SZ 6.5 (GLOVE) IMPLANT
GLOVE BIO SURGEON STRL SZ7.5 (GLOVE) ×9 IMPLANT
GLOVE SURG SS PI 7.0 STRL IVOR (GLOVE) IMPLANT
GLOVE SURG SS PI 7.5 STRL IVOR (GLOVE) IMPLANT
GOWN STRL REUS W/ TWL LRG LVL3 (GOWN DISPOSABLE) ×12 IMPLANT
GOWN STRL REUS W/ TWL XL LVL3 (GOWN DISPOSABLE) ×6 IMPLANT
GOWN STRL REUS W/TWL LRG LVL3 (GOWN DISPOSABLE) ×12
GOWN STRL REUS W/TWL XL LVL3 (GOWN DISPOSABLE) ×6
HEMOSTAT POWDER SURGIFOAM 1G (HEMOSTASIS) ×12 IMPLANT
HEMOSTAT SURGICEL 2X14 (HEMOSTASIS) IMPLANT
INSERT FOGARTY XLG (MISCELLANEOUS) IMPLANT
IV CATH 18G X1.75 CATHLON (IV SOLUTION) IMPLANT
KIT BASIN OR (CUSTOM PROCEDURE TRAY) ×3 IMPLANT
KIT LVAD HEARTMATE 3 W-CNTRL (Prosthesis & Implant Heart) IMPLANT
KIT SUCTION CATH 14FR (SUCTIONS) ×3 IMPLANT
KIT TURNOVER KIT B (KITS) ×3 IMPLANT
LEAD PACING MYOCARDI (MISCELLANEOUS) IMPLANT
LINE VENT (MISCELLANEOUS) IMPLANT
NS IRRIG 1000ML POUR BTL (IV SOLUTION) ×15 IMPLANT
PACK OPEN HEART (CUSTOM PROCEDURE TRAY) ×3 IMPLANT
PAD ARMBOARD 7.5X6 YLW CONV (MISCELLANEOUS) ×6 IMPLANT
POSITIONER HEAD DONUT 9IN (MISCELLANEOUS) ×3 IMPLANT
POWDER SURGICEL 3.0 GRAM (HEMOSTASIS) IMPLANT
PUNCH AORTIC ROTATE 4.5MM 8IN (MISCELLANEOUS) ×3 IMPLANT
SEALANT PATCH FIBRIN 2X4IN (MISCELLANEOUS) IMPLANT
SEALANT SURG COSEAL 8ML (VASCULAR PRODUCTS) ×3 IMPLANT
SET MPS 3-ND DEL (MISCELLANEOUS) IMPLANT
SHEATH AVANTI 11CM 5FR (SHEATH) IMPLANT
SPONGE SURGIFLO 8M (HEMOSTASIS) IMPLANT
SPONGE T-LAP 18X18 ~~LOC~~+RFID (SPONGE) ×12 IMPLANT
SUT ETHIBOND 2 0 SH (SUTURE) ×15
SUT ETHIBOND 2 0 SH 36X2 (SUTURE) ×15 IMPLANT
SUT ETHIBOND NAB MH 2-0 36IN (SUTURE) ×36 IMPLANT
SUT PROLENE 3 0 RB 1 (SUTURE) IMPLANT
SUT PROLENE 3 0 SH DA (SUTURE) ×6 IMPLANT
SUT PROLENE 4 0 RB 1 (SUTURE) ×18
SUT PROLENE 4 0 SH DA (SUTURE) IMPLANT
SUT PROLENE 4-0 RB1 .5 CRCL 36 (SUTURE) ×12 IMPLANT
SUT PROLENE 5 0 C 1 36 (SUTURE) IMPLANT
SUT PROLENE 5 0 C1 (SUTURE) IMPLANT
SUT PROLENE 6 0 CC (SUTURE) IMPLANT
SUT SILK 1 MH (SUTURE) ×12 IMPLANT
SUT SILK 1 TIES 10X30 (SUTURE) ×3 IMPLANT
SUT SILK 2 0 SH CR/8 (SUTURE) ×6 IMPLANT
SUT SILK 3 0 SH CR/8 (SUTURE) IMPLANT
SUT STEEL 6MS V (SUTURE) ×6 IMPLANT
SUT STEEL SZ 6 DBL 3X14 BALL (SUTURE) ×3 IMPLANT
SUT TEM PAC WIRE 2 0 SH (SUTURE) IMPLANT
SUT VIC AB 1 CTX 36 (SUTURE) ×12
SUT VIC AB 1 CTX36XBRD ANBCTR (SUTURE) ×6 IMPLANT
SUT VIC AB 2-0 CT1 27 (SUTURE) ×3
SUT VIC AB 2-0 CT1 TAPERPNT 27 (SUTURE) IMPLANT
SUT VIC AB 2-0 CTX 27 (SUTURE) ×6 IMPLANT
SUT VIC AB 3-0 SH 27 (SUTURE) ×3
SUT VIC AB 3-0 SH 27X BRD (SUTURE) IMPLANT
SUT VIC AB 3-0 SH 8-18 (SUTURE) ×3 IMPLANT
SUT VIC AB 3-0 X1 27 (SUTURE) ×6 IMPLANT
SYR 50ML LL SCALE MARK (SYRINGE) ×3 IMPLANT
SYSTEM SAHARA CHEST DRAIN ATS (WOUND CARE) ×3 IMPLANT
TAPE CLOTH SURG 4X10 WHT LF (GAUZE/BANDAGES/DRESSINGS) IMPLANT
TAPE PAPER 2X10 WHT MICROPORE (GAUZE/BANDAGES/DRESSINGS) IMPLANT
TOWEL GREEN STERILE (TOWEL DISPOSABLE) ×3 IMPLANT
TRAY CATH LUMEN 1 20CM STRL (SET/KITS/TRAYS/PACK) ×3 IMPLANT
TRAY FOLEY SLVR 16FR TEMP STAT (SET/KITS/TRAYS/PACK) ×3 IMPLANT
TUBE CONNECTING 12X1/4 (SUCTIONS) ×3 IMPLANT
UNDERPAD 30X36 HEAVY ABSORB (UNDERPADS AND DIAPERS) ×3 IMPLANT
VENT LEFT HEART 12002 (CATHETERS) IMPLANT
WATER STERILE IRR 1000ML POUR (IV SOLUTION) ×6 IMPLANT
YANKAUER SUCT BULB TIP NO VENT (SUCTIONS) ×3 IMPLANT

## 2022-08-29 NOTE — Progress Notes (Signed)
Ogt in place and ok to use per Dr Leafy Ro.

## 2022-08-29 NOTE — Progress Notes (Signed)
  I was asked to come to the OR due to inability to maintain good flows on LVAD  TEE images reviewed. LVAD cannula well positioned in apex.   There is severe thickening and invagination of posterior mitral valve annulus with near obliteration of mitral inflow.   Initially only able to flow about 3L on 4700.  Case d/w Dr. Donata Clay, echo team and Dr. Leafy Ro.   Decision made to give more volume and clip LA appendage  After appendage clipped able to flow nearly 4L at 5400 RPM with improvement in mitral inflow.   Total CC time spent > 60 mins   Charles Meres, MD  2:11 PM

## 2022-08-29 NOTE — Progress Notes (Signed)
Pharmacy Antibiotic Note  Charles Holmes is a 57 y.o. male admitted on 08/10/2022 with surgical prophylaxis s/p LVAD placement 8/13  Pharmacy has been consulted for Vancomycin dosing x 48 hours  Pt received Vanc 1250mg  IV pre-OR today at 0830.  Plan: Vancomycin 1gm IV q12h x 3 doses (as ordered) - dosing is appropriate. Will f/u renal function and pt's clinical condition  Height: 5\' 7"  (170.2 cm) Weight: 76.8 kg (169 lb 5 oz) IBW/kg (Calculated) : 66.1  Temp (24hrs), Avg:98.4 F (36.9 C), Min:97.7 F (36.5 C), Max:99.1 F (37.3 C)  Recent Labs  Lab 08/25/22 0416 08/25/22 0900 08/26/22 0504 08/27/22 0446 08/28/22 0405 08/28/22 0905 08/29/22 0409 08/29/22 0836 08/29/22 0951 08/29/22 1034 08/29/22 1132 08/29/22 1226 08/29/22 1306  WBC 7.5  --  7.4 6.5  --  7.9 6.9  --   --   --   --   --   --   CREATININE 1.59*   < > 0.84 0.84   < >  --  0.74   < > 0.70 0.60* 0.60* 0.60* 0.60*   < > = values in this interval not displayed.    Estimated Creatinine Clearance: 93 mL/min (A) (by C-G formula based on SCr of 0.6 mg/dL (L)).    Allergies  Allergen Reactions   Amiodarone     Severe tremors   Percocet [Oxycodone-Acetaminophen] Itching    Thank you for allowing pharmacy to be a part of this patient's care.  Christoper Fabian, PharmD, BCPS Please see amion for complete clinical pharmacist phone list 08/29/2022 3:36 PM

## 2022-08-29 NOTE — Brief Op Note (Signed)
08/10/2022 - 08/29/2022  2:55 PM  PATIENT:  Charles Holmes  57 y.o. male  PRE-OPERATIVE DIAGNOSIS:  SARCOID HEART DISEASE, HEART FAILURE/CARDIOGENIC SHOCK  POST-OPERATIVE DIAGNOSIS: SARCOID HEART DISEASE, HEART FAILURE/CARDIOGENIC SHOCK  PROCEDURES:   INSERTION OF HEARTMATE 3 IMPLANTABLE LEFT VENTRICULAR ASSIST DEVICE   APPLICATION OF LEFT ATRIAL APPENDAGE CLIP   REMOVAL OF INTRA-AORTIC BALLON PUMP (RT FEM ARTERY)  TRANSESOPHAGEAL ECHOCARDIOGRAM   SURGEON:   Lovett Sox, MD - Primary  PHYSICIAN ASSISTANT:   ASSISTANTS: Rosendo Gros, RN, Circulator  Waldron Labs, RN, RN First Assistant  Williams, Sadie, Scrub Person   ANESTHESIA:   general  EBL:  842 mL   BLOOD ADMINISTERED: FFP, Platelets, Cryoprecipitate  DRAINS:  bilateral pleural and mediastinal drains.    LOCAL MEDICATIONS USED:  NONE  SPECIMEN: Left ventricular apex    DISPOSITION OF SPECIMEN:  PATHOLOGY  COUNTS:  Correct  DICTATION: .Dragon Dictation  PLAN OF CARE: Admit to inpatient   PATIENT DISPOSITION:  ICU - intubated and hemodynamically stable.   Delay start of Pharmacological VTE agent (>24hrs) due to surgical blood loss or risk of bleeding: yes

## 2022-08-29 NOTE — Progress Notes (Addendum)
Patient arrived back to ICU s/p LVAD implant.  Intra-op patient was unable to maintain good flows. TEE showed severe thickening and invagination of posterior mitral valve annulus with near obliteration of mitral inflow. Decision to replete fluid and clip LA appendage. Received IVF and multiple blood products (FFP, cryo, plts). Flows improved.   Currently on: Milrinone @ 0.5, levo @ 3 and epi at 5  Swann #s  PAP: (42-66)/(19-36) 60/35 CVP:  [5 mmHg-21 mmHg] 24 mmHg (laying almost flat) CO:  [4.4 L/min-4.8 L/min] 4.8 L/min CI:  [2.4 L/min/m2-2.6 L/min/m2] 2.6 L/min/m2  LVAD Interrogation HM III: Speed: 5200 Flow: 4.1 PI: 1.6 Power: 3.6.  Stable in ICU, intubated and sedated. Plan to start diuresis tomorrow. Will follow up on biopsy from LAA to test for cardiac sarcoid.   Brynda Peon, AGACNP-BC  08/29/22 3:45 PM  Advanced Heart Failure Team   Agree with above.  Patient back in ICU on milrinone 0.5, Epi 6 NE 2 iNO 30.   Waking up on vent.   VAD flows ~ 4L on 5200. PI 2.0-2.5  Cardiac output done personally  CI now 2.1. CVP 18. PAPI 0.8   CTs dry.    Sedated on exam but will arouse RIJ swan Sternal dressing ok. CTs dry. + LVAD hum  Ab soft Extremities. Warm   Continue current regimen. Will follow closely.   Additional CCT 40 mins this evening.  Arvilla Meres, MD  6:54 PM

## 2022-08-29 NOTE — Progress Notes (Signed)
Patient transported from the OR to 2H02 on the ventilator & NoxBox by myself, [facility] RRT & OR staff with no problems.

## 2022-08-29 NOTE — Anesthesia Postprocedure Evaluation (Signed)
Anesthesia Post Note  Patient: Charles Holmes  Procedure(s) Performed: INSERTION OF IMPLANTABLE LEFT VENTRICULAR ASSIST DEVICE (Chest) TRANSESOPHAGEAL ECHOCARDIOGRAM CLIPPING OF ATRIAL APPENDAGE (Left: Chest) REMOVAL OF INTRA-AORTIC BALLON PUMP (Chest)     Patient location during evaluation: SICU Anesthesia Type: General Level of consciousness: sedated Pain management: pain level controlled Vital Signs Assessment: post-procedure vital signs reviewed and stable Respiratory status: patient remains intubated per anesthesia plan Cardiovascular status: stable Postop Assessment: no apparent nausea or vomiting Anesthetic complications: no   No notable events documented.  Last Vitals:  Vitals:   08/29/22 1533 08/29/22 1545  BP:    Pulse: (!) 30 (!) 30  Resp: 16 16  Temp: 37.2 C 37.3 C  SpO2:      Last Pain:  Vitals:   08/29/22 0400  TempSrc:   PainSc: 0-No pain                 , S

## 2022-08-29 NOTE — Anesthesia Procedure Notes (Signed)
Central Venous Catheter Insertion Performed by: Achille Rich, MD, anesthesiologist Start/End8/13/2024 8:13 AM, 08/29/2022 8:15 AM Patient location: Pre-op. Preanesthetic checklist: patient identified, IV checked, site marked, risks and benefits discussed, surgical consent, monitors and equipment checked, pre-op evaluation, timeout performed and anesthesia consent Hand hygiene performed  and maximum sterile barriers used  PA cath was placed.Swan type:thermodilution Procedure performed without using ultrasound guided technique. Attempts: 1 Patient tolerated the procedure well with no immediate complications.

## 2022-08-29 NOTE — Progress Notes (Signed)
Advanced Heart Failure Rounding Note  PCP-Cardiologist: Norman Herrlich, MD   Subjective:    7/25: Admit with cardiogenic shock. Started milrinone and NE. 8/6 S/P 13 teeth extractions  8/7 Diuresed with IV lasix + metolazone.  8/8 Diuresed with IV lasix + metolazone. ECHO repeated EF 15% RV mildly reduced. RVSP 77. 8/9 IABP placed  Remains on IABP and milrinone. Hemodynamics stable. Had large BM yesterday   PAP: (24-71)/(16-46) 60/35 CVP:  [0 mmHg-21 mmHg] 15 mmHg CO:  [4.4 L/min-5.4 L/min] 4.8 L/min CI:  [2.4 L/min/m2-2.89 L/min/m2] 2.6 L/min/m2   Objective:   Weight Range: 76.8 kg Body mass index is 26.52 kg/m.   Vital Signs:   Temp:  [97.7 F (36.5 C)-99 F (37.2 C)] 98.6 F (37 C) (08/13 0715) Pulse Rate:  [77-295] 266 (08/13 0715) Resp:  [13-26] 22 (08/13 0715) BP: (108-141)/(62-96) 126/84 (08/13 0400) SpO2:  [91 %-97 %] 92 % (08/13 0715) Arterial Line BP: (90-168)/(44-79) 115/70 (08/13 0715) Weight:  [76.8 kg] 76.8 kg (08/13 0500) Last BM Date : 08/28/22  Weight change: Filed Weights   08/27/22 0715 08/28/22 0500 08/29/22 0500  Weight: 72.5 kg 78.2 kg 76.8 kg    Intake/Output:   Intake/Output Summary (Last 24 hours) at 08/29/2022 0855 Last data filed at 08/29/2022 0805 Gross per 24 hour  Intake 681.34 ml  Output 1720 ml  Net -1038.66 ml   Physical Exam   General:  Lying flat in bed  No resp difficulty HEENT: normal Neck: supple. RIJ swan . Carotids 2+ bilat; no bruits. No lymphadenopathy or thryomegaly appreciated. Cor: PMI nondisplaced. Regular rate & rhythm. No rubs, gallops or murmurs. Lungs: clear Abdomen: soft, nontender, nondistended. No hepatosplenomegaly. No bruits or masses. Good bowel sounds. Extremities: no cyanosis, clubbing, rash, edema RFA IABP   Neuro: alert & orientedx3, cranial nerves grossly intact. moves all 4 extremities w/o difficulty. Affect pleasant   Telemetry     SR 80s with V pacing Personally reviewed     Labs     CBC Recent Labs    08/28/22 0905 08/29/22 0409 08/29/22 0836 08/29/22 0839  WBC 7.9 6.9  --   --   HGB 10.3* 10.1* 9.9* 10.5*  HCT 32.8* 32.3* 29.0* 31.0*  MCV 102.8* 105.9*  --   --   PLT 162 160  --   --     Basic Metabolic Panel Recent Labs    40/98/11 0405 08/29/22 0409 08/29/22 0836 08/29/22 0839  NA 132* 132* 135 134*  K 4.1 4.2 4.5 4.5  CL 96* 97* 97*  --   CO2 29 27  --   --   GLUCOSE 89 93 97  --   BUN 15 12 12   --   CREATININE 0.80 0.74 0.60*  --   CALCIUM 8.7* 8.5*  --   --   MG 1.8 1.9  --   --     BNP: BNP (last 3 results) Recent Labs    07/11/22 0209 07/26/22 1211 08/10/22 1142  BNP 1,588.2* 1,723.6* 1,905.0*     Imaging    DG CHEST PORT 1 VIEW  Result Date: 08/28/2022 CLINICAL DATA:  Cardiogenic shock. EXAM: PORTABLE CHEST 1 VIEW COMPARISON:  Chest x-ray dated August 28, 2022 FINDINGS: Unchanged enlargement of the cardiac contours. Interval removal of intra-aortic balloon pump. Unchanged position of right IJ approach PA catheter and right arm PICC. Left chest wall pacer with unchanged lead positioning. Lungs are clear. No evidence of pleural effusion or pneumothorax. Old right-sided  rib fractures. IMPRESSION: 1. Interval removal of intra-aortic balloon pump. 2. Stable support devices. 3. No new airspace opacity. Electronically Signed   By: Allegra Lai M.D.   On: 08/28/2022 13:26     Medications:     Scheduled Medications:  [MAR Hold] atorvastatin  80 mg Oral Daily   [MAR Hold] chlorhexidine  15 mL Mouth/Throat TID   [MAR Hold] Chlorhexidine Gluconate Cloth  6 each Topical Daily   [MAR Hold] dorzolamide-timolol  1 drop Both Eyes BID   epinephrine  0-10 mcg/min Intravenous To OR   [MAR Hold] feeding supplement  237 mL Oral TID BM   [MAR Hold] folic acid  1 mg Oral Daily   insulin   Intravenous To OR   [MAR Hold] latanoprost  1 drop Both Eyes QHS   magnesium sulfate  40 mEq Other To OR   [MAR Hold] mexiletine  250 mg Oral BID    [MAR Hold] mometasone-formoterol  2 puff Inhalation BID   [MAR Hold] multivitamin with minerals  1 tablet Oral Daily   mupirocin ointment  1 Application Nasal BID   [MAR Hold] pantoprazole  40 mg Oral QAC breakfast   phenylephrine  0-100 mcg/min Intravenous To OR   [MAR Hold] polyethylene glycol  17 g Oral Daily   potassium chloride  80 mEq Other To OR   [MAR Hold] senna-docusate  2 tablet Oral QHS   [MAR Hold] sodium chloride flush  3 mL Intravenous Q12H   tranexamic acid  15 mg/kg Intravenous To OR   tranexamic acid  2 mg/kg Intracatheter To OR   [MAR Hold] umeclidinium bromide  1 puff Inhalation Daily   vancomycin  1,000 mg Other To OR   vasopressin  0.04 Units/min Intravenous To OR    Infusions:  [MAR Hold] sodium chloride      ceFAZolin (ANCEF) IV     dexmedetomidine     DOBUTamine     DOPamine     fluconazole (DIFLUCAN) IV     heparin 30,000 units/NS 1000 mL solution for CELLSAVER     milrinone 0.5 mcg/kg/min (08/29/22 0700)   nitroGLYCERIN     norepinephrine (LEVOPHED) Adult infusion     rifampin (RIFADIN) 600 mg in sodium chloride 0.9 % 100 mL IVPB     tranexamic acid (CYKLOKAPRON) 2,500 mg in sodium chloride 0.9 % 250 mL (10 mg/mL) infusion     vancomycin      PRN Medications: [MAR Hold] sodium chloride, 0.9 % irrigation (POUR BTL), [MAR Hold] acetaminophen, [MAR Hold] acetaminophen, [MAR Hold] albuterol, ALPRAZolam, [MAR Hold] diphenhydrAMINE, hemostatic agents (no charge) Optime, [MAR Hold] HYDROcodone-acetaminophen, [MAR Hold] LORazepam, [MAR Hold] ondansetron (ZOFRAN) IV, [MAR Hold] ondansetron (ZOFRAN) IV, [MAR Hold] mouth rinse, [MAR Hold] sodium chloride flush, [MAR Hold] sodium chloride flush, Surgifoam 1 Gm with 0.9% sodium chloride (4 ml) topical solution, temazepam, vancomycin   Patient Profile  Charles Holmes is a 58 y.o. 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 Admitted with cardiogenic shock.    Assessment/Plan   1.  Acute on chronic Systolic HF-->Cardiogenic Shock  - Diagnosed 11/2019. Presented with VT. LHC 70% LAD  - cMRI 12/21 concerning for sarcoid and EF 18%.  - PET 2/22 at [facility] EF 25% + active sarcoid - Echo 08/26/20 EF < 20% severely dilated LV RV mildly decreased.  - CPX 8/22. Unable to complete PFTs due to hypoventilation and poor effort. pVO2 16.1 (49% predicted). Slope 19 RER:  1.17 PETCO2 59 - [employer] CRT-D upgrade in 06/08/21 - Echo 07/10/22: EF <20%, RV okay, mod pericardial effusion, mod Charles/TR - Admitted 07/25 with cardiogenic shock. - RHC: Nonobstructive CAD, severely elevated filling pressures and low Fick CO/CI (2.7/1.4) - IABP placed 8/9. Hemodynamics improved with reduction in PA pressures. CO-OX 68%. Continue IABP at 1:1 and milrinone 0.5. - Plan VAD today with Dr. Donata Clay   2.  Hx of stroke -Admitted 06/24 w/ R MCA stroke. S/p TPA and mechanical clot extraction. No residual deficits  -Likely cardioembolic in setting of severe LV dysfunction. 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   - Off eliquis  - On heparin drip with no s/s bleeding  3. 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 Continue mexilitene  - now s/p ICD.   4. CAD - LHC 12/07/19 70-% LAD, no intervention - LHC 8/24 non obstructive CAD.  - No s/s angina - Continue statin. Off aspirin.   5. Cardiac sarcoid - PET 2/22 at [facility] EF 25% + active sarcoid - Has completed prednisone.  - Will stop methotrexate with upcoming surgery.  - Will send apical core to pathology to confirm diagnosis of cardiac sarcoid  6. 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   7. AKI - suspect cardiorenal, improved w/ inotropic support - Creatinine now improved to 0.8 - follow BMP    8. Iron deficiency anemia - recent T sat 15%, scheduled for OP feraheme. Will complete inpatient    9. Pulmonary  -  PFTs with severe obstructive defect, response to bronchodilator - FEV1 1.04L, FEV1/FVC 48% - Consulted pulmonary to assist with pre VAD optimization. Appreciate assistance. - Continue IS  10. Constipation - resolved  CRITICAL CARE Performed by: Arvilla Meres  Total critical care time: 40 minutes  Critical care time was exclusive of separately billable procedures and treating other patients.  Critical care was necessary to treat or prevent imminent or life-threatening deterioration.  Critical care was time spent personally by me (independent of midlevel providers or residents) on the following activities: development of treatment plan with patient and/or surrogate as well as nursing, discussions with consultants, evaluation of patient's response to treatment, examination of patient, obtaining history from patient or surrogate, ordering and performing treatments and interventions, ordering and review of laboratory studies, ordering and review of radiographic studies, pulse oximetry and re-evaluation of patient's condition.   Length of Stay: 62  Arvilla Meres, MD  08/29/2022, 8:55 AM  Advanced Heart Failure Team Pager [phone] (M-F; 7a - 5p)  Please contact [facility] for night-coverage after hours (5p -7a ) and weekends on [email or web address]  Patient seen and examined with the above-signed Advanced Practice Provider and/or Housestaff. I personally reviewed laboratory data, imaging studies and relevant notes. I independently examined the patient and formulated the important aspects of the plan. I have edited the note to reflect any of my changes or salient points. I have personally discussed the plan with the patient and/or family.  Remains on IABP and milrinone. Hemodynamics much improved. No bleeding with heparin.   Constipated.   General:  Lying in bed  No resp difficulty HEENT: normal Neck: supple. RIJ swan Carotids 2+ bilat; no bruits. No lymphadenopathy or thryomegaly  appreciated. Cor:  Regular rate & rhythm. No rubs, gallops or murmurs. Lungs: clear Abdomen: soft, nontender, nondistended. No hepatosplenomegaly. No bruits or masses. Good bowel sounds. Extremities: no cyanosis,  clubbing, rash, edema RFA IABP site ok Neuro: alert & orientedx3, cranial nerves grossly intact. moves all 4 extremities w/o difficulty. Affect pleasant  Hemodynamics much improved. Continue current care. Will adjust bowel regimen.   VAD in am. D/w TCTS.   CRITICAL CARE Performed by: Arvilla Meres  Total critical care time: 45 minutes  Critical care time was exclusive of separately billable procedures and treating other patients.  Critical care was necessary to treat or prevent imminent or life-threatening deterioration.  Critical care was time spent personally by me (independent of midlevel providers or residents) on the following activities: development of treatment plan with patient and/or surrogate as well as nursing, discussions with consultants, evaluation of patient's response to treatment, examination of patient, obtaining history from patient or surrogate, ordering and performing treatments and interventions, ordering and review of laboratory studies, ordering and review of radiographic studies, pulse oximetry and re-evaluation of patient's condition.  Arvilla Meres, MD  8:55 AM

## 2022-08-29 NOTE — Progress Notes (Signed)
Pre Procedure note for inpatients:   Charles Holmes has been scheduled for Procedure(s): INSERTION OF IMPLANTABLE LEFT VENTRICULAR ASSIST DEVICE (N/A) TRANSESOPHAGEAL ECHOCARDIOGRAM (N/A) today. The various methods of treatment have been discussed with the patient. After consideration of the risks, benefits and treatment options the patient has consented to the planned procedure.   The patient has been seen and labs reviewed. There are no changes in the patient's condition to prevent proceeding with the planned procedure today.  Recent labs:  Lab Results  Component Value Date   WBC 6.9 08/29/2022   HGB 10.1 (L) 08/29/2022   HCT 32.3 (L) 08/29/2022   PLT 160 08/29/2022   GLUCOSE 93 08/29/2022   CHOL 90 08/11/2022   TRIG 64 08/11/2022   HDL 26 (L) 08/11/2022   LDLCALC 51 08/11/2022   ALT 38 08/27/2022   AST 27 08/27/2022   NA 132 (L) 08/29/2022   K 4.2 08/29/2022   CL 97 (L) 08/29/2022   CREATININE 0.74 08/29/2022   BUN 12 08/29/2022   CO2 27 08/29/2022   TSH 1.211 08/11/2022   INR 1.1 08/28/2022   HGBA1C 5.5 08/11/2022    Lovett Sox, MD 08/29/2022 8:01 AM

## 2022-08-29 NOTE — Op Note (Unsigned)
NAMEHULEY, JUPIN MEDICAL RECORD NO: [id number] ACCOUNT NO: [id number] DATE OF BIRTH: [birth year] FACILITY: [facility] LOCATION: [facility] PHYSICIAN: Kerin Perna III, MD  Operative Report   DATE OF PROCEDURE: 08/29/2022  OPERATION:   1.  Implantation of HeartMate 3 left ventricular assist device. 2.  Removal of intraaortic balloon pump from right femoral artery. 3.  Placement of left atrial clip 45 mm.  SURGEON:  Mikey Bussing, MD  ASSISTANT:  Jillyn Hidden, PA-C.  A surgical first assistant was needed for this procedure due to its acuity and complexity and is the standard of care for major cardiac surgery at this institution.  The surgical assistant helped with the placement  of the VAD apical cannula into the LV apex by making an incision in the heart, assisting with suture placement and suture management of the inflow disk and assistance with the driveline/power cord placement and tracking through the abdominal wall.  The  surgical first assistant was needed to be perform the outflow graft, end-to-side anastomosis deep in the chest to the ascending aorta.  ANESTHESIA:  General by Dr. Achille Rich.  PREOPERATIVE DIAGNOSES: 1.  Nonischemic cardiomyopathy, sarcoid disease of the heart. 2.  Advance refractory heart failure with preoperative cardiogenic shock requiring intraaortic balloon pump support. 3.  COPD, mixed with some sarcoid lung disease.  POSTOPERATIVE DIAGNOSES: 1.  Nonischemic cardiomyopathy, sarcoid disease of the heart. 2.  Advance refractory heart failure with preoperative cardiogenic shock requiring intraaortic balloon pump support. 3.  COPD, mixed with some sarcoid lung disease.  DESCRIPTION OF PROCEDURE:  The patient was brought directly from the ICU to the OR on balloon pump support and with a pulmonary artery catheter had been placed.  The patient was ventilating spontaneously and was responsive and stable.  The anesthesia  team and OR nursing team  positioned the patient on the OR table.  General anesthesia was induced and a new PA catheter was placed in the right IJ and left radial artery with A-line placed for monitoring.  The patient was then prepped and draped after a  transesophageal echo probe was placed by the anesthesia team, which showed severe LV dysfunction, moderate RV dysfunction, moderate TR, mild AI, and no evidence of ASD.  After the patient was prepped and draped a proper timeout was performed.  A sternal  incision was made and the sternotomy completed.  A counter incision in the mid central abdomen was placed for the power cord tunnel and the exit site beneath the left costal margin.  The pericardium was opened and there was a moderate pericardial  effusion, which was drained.  The pericardial cradle was created and pursestrings were placed in the ascending aorta and right atrium and heparin was administered.  When the ACT was documented as being therapeutic, the patient was cannulated and placed  on cardiopulmonary bypass.  Heart was inspected.  The LV was severely dilated and hypocontractile.  The RV function was moderately reduced with some dilatation as well.  CO2 was insufflated into the operative field and warm lap pads were placed in the  pericardium to elevate the LV apex. A small to moderate size pocket had been created by partially taking down the left hemidiaphragm and opening the left pleural space.  The true apex was identified and an incision was made in the apex with 11  blade dilated with a tonsil clamp and a Foley catheter inserted with the coronary knife attached.  The Foley balloon was inflated and provided countertraction when  the coronary knife was carefully rotated and a plug of LV apex was removed and submitted  to pathology.  Hemostasis was achieved.  Next, the inflow cannula site was encircled with interrupted 2-0 Ethibond pledgeted sutures  which were placed around the apex using overlapping technique of the  pledgets.  The apical disk was then placed on the opening  and the previously  placed sutures were passed through the felt and tied.  The HeartMate 3 pump was then brought to the field and volume was left into the patient to  fill the LV chamber as the head of the bed was lowered and the HeartMate pump inflow cannula was placed in the apex through the disk and the locking mechanism  engaged.  There was good orientation of the pump and the pump was then carefully placed on into the pericardial space and the pocket.  Next, the power cord was then brought out through the tunneling instruments to the midline and then over to the left  subcostal area.  The outflow graft was then brought around the outside of the right atrial cannula and cut to the appropriate length and beveled.  A partial occluding clamp was placed on the ascending aorta and an aortotomy was performed.  An end-to-side running  anastomosis of 4-0 Prolene was then used to construct the outflow graft to the aorta connection.  This suture line was then coated with a fine coat of CoSeal.  A vascular clamp was placed on the graft and a needle below the graft and the partial clamp  was removed and air was vented from the graft.  There was good hemostasis.  Temporary pacing wires were applied and the patient was prepared for separation from cardiopulmonary bypass.  The lungs were expanded.  Inhaled nitric oxide was initiated.  Inotropic support was resumed by anesthesia and the patient was gradually weaned from cardiopulmonary bypass to HeartMate 3 VAD flow support.  There was some difficulty with LV filling and upon careful inspection of the TEE, it was noted that the mitral  valve apparatus, specifically the posterior annulus appeared to be redundant with the activation of the HeartMate 3 VAD to pull blood out of the dilated LV chamber.  Significant time and discussion was spent with Dr. Gala Romney who  was called to review the  echo and we discussed  the best thing for the patient to help with this problem for coordination of care.  A left atrial appendage clip was placed to help stabilize the atrium and to provide some support to the paravalvular tissue.The patient was not a candidate for aortic cross clamp and cardioplegia as adequate exposure of the mitral valve within the heart would be precluded  with VAD in place.On echo after LA clip application appeared  to improve inflow into the LV and LV filling and pump flow and the patient was successfully transferred off cardiopulmonary bypass.  We observed the patient for a period of time.  The inflow cannula was in excellent position.  The outflow graft had no  obstruction.  There was baseline mild AI and moderate TR and the MR was improved.  The venous cannula was removed and protamine was administered to reverse the heparin.  Inotropic support was needed, but only at a small to moderate dose.  The arterial  cannula was removed and hemostasis was achieved.  Bilateral pleural tubes and anterior mediastinal and VAD pocket drain were placed through the mediastinum.  The superior pericardial fat was closed over the  aortic graft.  The patient remained stable.   Interrupted steel wires were placed and tightened and the patient remained stable.  The pectoralis fascia was closed with a running #1 Vicryl and subcutaneous and skin layers were closed with a running Vicryl.  The chest tubes were secured to the skin.  The right femoral intraaortic balloon pump, which had been turned off after going on bypass was then removed percutaneously and pressure held for 55 minutes with ultimately good hemostasis.  The VAD counterincisions for the power cord tunnel were closed  with interrupted Vicryl and running skin sutures.  Sterile dressings were applied.  The patient was then transported back to the ICU in stable hemodynamic condition.   PUS D: 08/29/2022 5:13:40 pm T: 08/29/2022 7:26:00 pm  JOB: [id number]/ [id number]

## 2022-08-29 NOTE — Progress Notes (Signed)
Pt to CVOR with anesthesia team.

## 2022-08-29 NOTE — Progress Notes (Signed)
CSW met with patient's daughter and husband in the waiting room. Daughter asked appropriate questions and discussed caregiver role. She states she plans to stay until she can visit with patient later today and will head home after seeing patient. CSW provided supportive intervention and will continue to follow. Lasandra Beech, LCSW, CCSW-MCS [phone]

## 2022-08-29 NOTE — Transfer of Care (Signed)
Immediate Anesthesia Transfer of Care Note  Patient: Charles Holmes  Procedure(s) Performed: INSERTION OF IMPLANTABLE LEFT VENTRICULAR ASSIST DEVICE (Chest) TRANSESOPHAGEAL ECHOCARDIOGRAM CLIPPING OF ATRIAL APPENDAGE (Left: Chest) REMOVAL OF INTRA-AORTIC BALLON PUMP (Chest)  Patient Location: ICU  Anesthesia Type:General  Level of Consciousness: sedated and Patient remains intubated per anesthesia plan  Airway & Oxygen Therapy: Patient remains intubated per anesthesia plan and Patient placed on Ventilator (see vital sign flow sheet for setting)  Post-op Assessment: Report given to RN and Post -op Vital signs reviewed and stable  Post vital signs: Reviewed and stable  Last Vitals:  Vitals Value Taken Time  BP 94/68 (87)   Temp    Pulse 90   Resp 18   SpO2 100     Last Pain:  Vitals:   08/29/22 0400  TempSrc:   PainSc: 0-No pain      Patients Stated Pain Goal: 2 (08/10/22 2000)  Complications: No notable events documented.

## 2022-08-29 NOTE — Progress Notes (Signed)
     [address]       [place] [address]             [phone]       EVENING ROUNDS  POD#0 SP LVAD Not bleeding LVAD with good output ABG not acidotic Leaving intubated overnight

## 2022-08-29 NOTE — Anesthesia Procedure Notes (Signed)
Arterial Line Insertion Start/End8/13/2024 8:12 AM, 08/29/2022 8:13 AM Performed by: Achille Rich, MD, Rachel Moulds, CRNA, anesthesiologist  Patient location: OR. Left, radial was placed Catheter size: 20 G Hand hygiene performed , maximum sterile barriers used  and Seldinger technique used Allen's test indicative of satisfactory collateral circulation Attempts: 1 Procedure performed without using ultrasound guided technique. Following insertion, dressing applied and Biopatch. Post procedure assessment: normal  Patient tolerated the procedure well with no immediate complications.

## 2022-08-29 NOTE — Anesthesia Preprocedure Evaluation (Signed)
Anesthesia Evaluation  Patient identified by MRN, date of birth, ID band Patient awake    Reviewed: Allergy & Precautions, H&P , NPO status , Patient's Chart, lab work & pertinent test results  Airway Mallampati: II   Neck ROM: full    Dental   Pulmonary COPD, former smoker   breath sounds clear to auscultation       Cardiovascular hypertension, + CAD and +CHF  + Cardiac Defibrillator  Rhythm:regular Rate:Normal  EF 18%, PAP 77 mmHg, grossly normal mitral valve and aortic valve   Neuro/Psych CVA    GI/Hepatic ,GERD  ,,  Endo/Other    Renal/GU      Musculoskeletal   Abdominal   Peds  Hematology   Anesthesia Other Findings   Reproductive/Obstetrics                             Anesthesia Physical Anesthesia Plan  ASA: 4  Anesthesia Plan: General   Post-op Pain Management:    Induction: Intravenous  PONV Risk Score and Plan: 2 and Ondansetron, Dexamethasone, Midazolam and Treatment may vary due to age or medical condition  Airway Management Planned: Oral ETT  Additional Equipment: Arterial line, Ultrasound Guidance Line Placement, TEE, PA Cath and CVP  Intra-op Plan:   Post-operative Plan: Post-operative intubation/ventilation  Informed Consent: I have reviewed the patients History and Physical, chart, labs and discussed the procedure including the risks, benefits and alternatives for the proposed anesthesia with the patient or authorized representative who has indicated his/her understanding and acceptance.     Dental advisory given  Plan Discussed with: CRNA, Anesthesiologist and Surgeon  Anesthesia Plan Comments:        Anesthesia Quick Evaluation

## 2022-08-29 NOTE — Anesthesia Procedure Notes (Signed)
Central Venous Catheter Insertion Performed by: Achille Rich, MD, anesthesiologist Start/End8/13/2024 8:02 AM, 08/29/2022 8:16 AM Patient location: OR. Preanesthetic checklist: patient identified, IV checked, site marked, risks and benefits discussed, surgical consent, monitors and equipment checked, pre-op evaluation, timeout performed and anesthesia consent Position: Trendelenburg Patient sedated Hand hygiene performed  and maximum sterile barriers used  Catheter size: 9 Fr Sheath introducer Procedure performed using ultrasound guided technique. Ultrasound Notes:anatomy identified, needle tip was noted to be adjacent to the nerve/plexus identified, no ultrasound evidence of intravascular and/or intraneural injection and image(s) printed for medical record Attempts: 1 Following insertion, line sutured and dressing applied. Post procedure assessment: blood return through all ports, free fluid flow and no air  Patient tolerated the procedure well with no immediate complications.

## 2022-08-29 NOTE — Anesthesia Procedure Notes (Signed)
Procedure Name: Intubation Date/Time: 08/29/2022 8:13 AM  Performed by: Rachel Moulds, CRNAPre-anesthesia Checklist: Patient identified, Emergency Drugs available, Suction available and Patient being monitored Patient Re-evaluated:Patient Re-evaluated prior to induction Oxygen Delivery Method: Circle System Utilized Preoxygenation: Pre-oxygenation with 100% oxygen Induction Type: IV induction Ventilation: Mask ventilation without difficulty Laryngoscope Size: Mac and 4 Grade View: Grade I Tube type: Oral Tube size: 8.0 mm Number of attempts: 1 Airway Equipment and Method: Stylet Placement Confirmation: ETT inserted through vocal cords under direct vision, positive ETCO2 and breath sounds checked- equal and bilateral Secured at: 24 cm Tube secured with: Tape Dental Injury: Teeth and Oropharynx as per pre-operative assessment  Comments: Performed by sam foster srna

## 2022-08-30 ENCOUNTER — Inpatient Hospital Stay (HOSPITAL_COMMUNITY): Payer: Medicare Other | Admitting: Anesthesiology

## 2022-08-30 ENCOUNTER — Encounter (HOSPITAL_COMMUNITY)
Admission: EM | Disposition: A | Discharge status: Rehabilitation Facility | Payer: Self-pay | Source: Home / Self Care | Attending: Internal Medicine

## 2022-08-30 ENCOUNTER — Inpatient Hospital Stay (HOSPITAL_COMMUNITY): Payer: Medicare Other

## 2022-08-30 ENCOUNTER — Encounter (HOSPITAL_COMMUNITY): Payer: Self-pay | Admitting: Cardiothoracic Surgery

## 2022-08-30 DIAGNOSIS — I639 Cerebral infarction, unspecified: Secondary | ICD-10-CM

## 2022-08-30 DIAGNOSIS — I110 Hypertensive heart disease with heart failure: Secondary | ICD-10-CM | POA: Diagnosis not present

## 2022-08-30 DIAGNOSIS — I63511 Cerebral infarction due to unspecified occlusion or stenosis of right middle cerebral artery: Secondary | ICD-10-CM | POA: Diagnosis not present

## 2022-08-30 DIAGNOSIS — I6601 Occlusion and stenosis of right middle cerebral artery: Secondary | ICD-10-CM | POA: Diagnosis not present

## 2022-08-30 DIAGNOSIS — I5023 Acute on chronic systolic (congestive) heart failure: Secondary | ICD-10-CM | POA: Diagnosis not present

## 2022-08-30 DIAGNOSIS — Z95811 Presence of heart assist device: Secondary | ICD-10-CM

## 2022-08-30 DIAGNOSIS — I509 Heart failure, unspecified: Secondary | ICD-10-CM | POA: Diagnosis not present

## 2022-08-30 DIAGNOSIS — Z515 Encounter for palliative care: Secondary | ICD-10-CM | POA: Diagnosis not present

## 2022-08-30 DIAGNOSIS — Z87891 Personal history of nicotine dependence: Secondary | ICD-10-CM

## 2022-08-30 DIAGNOSIS — I428 Other cardiomyopathies: Secondary | ICD-10-CM | POA: Diagnosis not present

## 2022-08-30 HISTORY — PX: RADIOLOGY WITH ANESTHESIA: SHX6223

## 2022-08-30 HISTORY — PX: IR PERCUTANEOUS ART THROMBECTOMY/INFUSION INTRACRANIAL INC DIAG ANGIO: IMG6087

## 2022-08-30 HISTORY — PX: IR US GUIDE VASC ACCESS LEFT: IMG2389

## 2022-08-30 HISTORY — PX: IR CT HEAD LTD: IMG2386

## 2022-08-30 LAB — POCT I-STAT 7, (LYTES, BLD GAS, ICA,H+H)
Acid-base deficit: 2 mmol/L (ref 0.0–2.0)
Acid-base deficit: 1 mmol/L (ref 0.0–2.0)
Acid-base deficit: 2 mmol/L (ref 0.0–2.0)
Bicarbonate: 23.4 mmol/L (ref 20.0–28.0)
Bicarbonate: 24 mmol/L (ref 20.0–28.0)
Bicarbonate: 23.2 mmol/L (ref 20.0–28.0)
Calcium, Ion: 1.14 mmol/L — ABNORMAL LOW (ref 1.15–1.40)
Calcium, Ion: 1.12 mmol/L — ABNORMAL LOW (ref 1.15–1.40)
Calcium, Ion: 1.14 mmol/L — ABNORMAL LOW (ref 1.15–1.40)
HCT: 27 % — ABNORMAL LOW (ref 39.0–52.0)
HCT: 26 % — ABNORMAL LOW (ref 39.0–52.0)
HCT: 26 % — ABNORMAL LOW (ref 39.0–52.0)
Hemoglobin: 9.2 g/dL — ABNORMAL LOW (ref 13.0–17.0)
Hemoglobin: 8.8 g/dL — ABNORMAL LOW (ref 13.0–17.0)
Hemoglobin: 8.8 g/dL — ABNORMAL LOW (ref 13.0–17.0)
O2 Saturation: 100 %
O2 Saturation: 99 %
O2 Saturation: 99 %
Patient temperature: 37.4
Patient temperature: 36.5
Patient temperature: 36.9
Potassium: 4.9 mmol/L (ref 3.5–5.1)
Potassium: 4.9 mmol/L (ref 3.5–5.1)
Potassium: 4.4 mmol/L (ref 3.5–5.1)
Sodium: 135 mmol/L (ref 135–145)
Sodium: 135 mmol/L (ref 135–145)
Sodium: 135 mmol/L (ref 135–145)
TCO2: 25 mmol/L (ref 22–32)
TCO2: 25 mmol/L (ref 22–32)
TCO2: 24 mmol/L (ref 22–32)
pCO2 arterial: 42.3 mmHg (ref 32–48)
pCO2 arterial: 39.7 mmHg (ref 32–48)
pCO2 arterial: 38.3 mmHg (ref 32–48)
pH, Arterial: 7.352 (ref 7.35–7.45)
pH, Arterial: 7.388 (ref 7.35–7.45)
pH, Arterial: 7.391 (ref 7.35–7.45)
pO2, Arterial: 186 mmHg — ABNORMAL HIGH (ref 83–108)
pO2, Arterial: 133 mmHg — ABNORMAL HIGH (ref 83–108)
pO2, Arterial: 148 mmHg — ABNORMAL HIGH (ref 83–108)

## 2022-08-30 LAB — GLUCOSE, CAPILLARY
Glucose-Capillary: 114 mg/dL — ABNORMAL HIGH (ref 70–99)
Glucose-Capillary: 113 mg/dL — ABNORMAL HIGH (ref 70–99)
Glucose-Capillary: 111 mg/dL — ABNORMAL HIGH (ref 70–99)
Glucose-Capillary: 107 mg/dL — ABNORMAL HIGH (ref 70–99)
Glucose-Capillary: 101 mg/dL — ABNORMAL HIGH (ref 70–99)
Glucose-Capillary: 105 mg/dL — ABNORMAL HIGH (ref 70–99)
Glucose-Capillary: 152 mg/dL — ABNORMAL HIGH (ref 70–99)

## 2022-08-30 LAB — COOXEMETRY PANEL
Carboxyhemoglobin: 1.8 % — ABNORMAL HIGH (ref 0.5–1.5)
Carboxyhemoglobin: 1.8 % — ABNORMAL HIGH (ref 0.5–1.5)
Methemoglobin: 0.9 % (ref 0.0–1.5)
Methemoglobin: <0.7 % (ref 0.0–1.5)
O2 Saturation: 65.1 %
O2 Saturation: 63.9 %
Total hemoglobin: 9 g/dL — ABNORMAL LOW (ref 12.0–16.0)
Total hemoglobin: 8.7 g/dL — ABNORMAL LOW (ref 12.0–16.0)

## 2022-08-30 SURGERY — IR WITH ANESTHESIA
Anesthesia: General

## 2022-08-30 MED ORDER — ADULT MULTIVITAMIN W/MINERALS CH
1.0000 | ORAL_TABLET | Freq: Every day | ORAL | Status: DC
  Administered 2022-08-31 – 2022-09-08 (×9): 1
  Filled 2022-08-30 (×9): qty 1

## 2022-08-30 MED ORDER — ALBUMIN HUMAN 5 % IV SOLN
12.5000 g | Freq: Once | INTRAVENOUS | Status: AC
  Administered 2022-08-30: 12.5 g via INTRAVENOUS

## 2022-08-30 MED ORDER — MIDAZOLAM HCL (PF) 5 MG/ML IJ SOLN
INTRAMUSCULAR | Status: DC | PRN
  Administered 2022-08-30: 2 mg via INTRAVENOUS

## 2022-08-30 MED ORDER — ATORVASTATIN CALCIUM 80 MG PO TABS
80.0000 mg | ORAL_TABLET | Freq: Every day | ORAL | Status: DC
  Administered 2022-08-31 – 2022-09-08 (×9): 80 mg
  Filled 2022-08-30 (×9): qty 1

## 2022-08-30 MED ORDER — CEFAZOLIN SODIUM-DEXTROSE 2-3 GM-%(50ML) IV SOLR
INTRAVENOUS | Status: DC | PRN
  Administered 2022-08-30: 2 g via INTRAVENOUS

## 2022-08-30 MED ORDER — IOHEXOL 300 MG/ML  SOLN
150.0000 mL | Freq: Once | INTRAMUSCULAR | Status: AC | PRN
  Administered 2022-08-30: 54 mL via INTRA_ARTERIAL

## 2022-08-30 MED ORDER — INSULIN ASPART 100 UNIT/ML IJ SOLN
0.0000 [IU] | INTRAMUSCULAR | Status: DC
  Administered 2022-08-30: 2 [IU] via SUBCUTANEOUS
  Administered 2022-08-31: 4 [IU] via SUBCUTANEOUS
  Administered 2022-08-31 (×2): 2 [IU] via SUBCUTANEOUS

## 2022-08-30 MED ORDER — CLEVIDIPINE BUTYRATE 0.5 MG/ML IV EMUL: 0.0000 mg/h | INTRAVENOUS | Status: DC

## 2022-08-30 MED ORDER — NITROGLYCERIN 1 MG/10 ML FOR IR/CATH LAB
INTRA_ARTERIAL | Status: AC
  Filled 2022-08-30: qty 10

## 2022-08-30 MED ORDER — SODIUM CHLORIDE 0.9 % IV SOLN: INTRAVENOUS | Status: DC

## 2022-08-30 MED ORDER — ALBUMIN HUMAN 5 % IV SOLN
12.5000 g | Freq: Once | INTRAVENOUS | Status: AC
  Administered 2022-08-30: 12.5 g via INTRAVENOUS
  Filled 2022-08-30: qty 250

## 2022-08-30 MED ORDER — FENTANYL CITRATE (PF) 100 MCG/2ML IJ SOLN
INTRAMUSCULAR | Status: AC
  Filled 2022-08-30: qty 2

## 2022-08-30 MED ORDER — FENTANYL CITRATE (PF) 250 MCG/5ML IJ SOLN
INTRAMUSCULAR | Status: DC | PRN
  Administered 2022-08-30 (×2): 50 ug via INTRAVENOUS

## 2022-08-30 MED ORDER — ROCURONIUM BROMIDE 10 MG/ML (PF) SYRINGE
PREFILLED_SYRINGE | INTRAVENOUS | Status: DC | PRN
  Administered 2022-08-30: 30 mg via INTRAVENOUS
  Administered 2022-08-30: 40 mg via INTRAVENOUS

## 2022-08-30 MED ORDER — LACTATED RINGERS IV SOLN: INTRAVENOUS | Status: DC | PRN

## 2022-08-30 MED ORDER — SODIUM CHLORIDE (PF) 0.9 % IJ SOLN
INTRAVENOUS | Status: AC | PRN
  Administered 2022-08-30: 25 ug via INTRA_ARTERIAL

## 2022-08-30 MED ORDER — IOHEXOL 350 MG/ML SOLN
100.0000 mL | Freq: Once | INTRAVENOUS | Status: AC | PRN
  Administered 2022-08-30: 100 mL via INTRAVENOUS

## 2022-08-30 MED ORDER — FENTANYL CITRATE PF 50 MCG/ML IJ SOSY
50.0000 ug | PREFILLED_SYRINGE | Freq: Once | INTRAMUSCULAR | Status: AC
  Administered 2022-08-30: 50 ug via INTRAVENOUS

## 2022-08-30 MED ORDER — DOCUSATE SODIUM 50 MG/5ML PO LIQD
200.0000 mg | Freq: Every day | ORAL | Status: DC
  Administered 2022-08-30 – 2022-08-31 (×2): 200 mg
  Filled 2022-08-30 (×2): qty 20

## 2022-08-30 MED ORDER — MIDAZOLAM HCL 2 MG/2ML IJ SOLN
INTRAMUSCULAR | Status: AC
  Filled 2022-08-30: qty 2

## 2022-08-30 MED ORDER — PANTOPRAZOLE SODIUM 40 MG IV SOLR
40.0000 mg | Freq: Every day | INTRAVENOUS | Status: DC
  Administered 2022-08-30 – 2022-09-15 (×17): 40 mg via INTRAVENOUS
  Filled 2022-08-30 (×17): qty 10

## 2022-08-30 MED FILL — Magnesium Sulfate Inj 50%: INTRAMUSCULAR | Qty: 10 | Status: AC

## 2022-08-30 MED FILL — Potassium Chloride Inj 2 mEq/ML: INTRAVENOUS | Qty: 40 | Status: AC

## 2022-08-30 MED FILL — Heparin Sodium (Porcine) Inj 1000 Unit/ML: Qty: 1000 | Status: AC

## 2022-08-30 NOTE — Progress Notes (Signed)
   Patient seen earlier on ICU evening rounds.   Throughout the afternoon, his PI has trended down on the increased VAD speed. I turned the VAD speed back down to 5300 and gave 2 bottles of albumin with improvement. Epi also increased back up slightly.   He is now just back from CT and images reviewed with family which shows a small to moderate area of hemorrhagic conversion at stroke site. Thus, we will defer starting heparin.   On exam he is sedated but will arouse and follow commands. He is densely plegic on the left side with only minimal dorsiflexion of the L foot capability. I d/w Neurology who feels that he will have significant residual Neuro deficits on the left (LUE>LLE).   Will keep intubated overnight. Cane wean iNO. Plan repeat CT in am followed by extubation if stable.   Additional CCT 40 mins.   Arvilla Meres, MD  11:25 PM

## 2022-08-30 NOTE — Procedures (Addendum)
INR.  Status post right common carotid arteriogram.  Left CFA approach.  Findings.  1.  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  Status post complete revascularization of occluded right middle cerebral artery M1 segment with 1 pass with the 071 aspiration device with a 4 mm x 40 mm SolitaireX retriever  device achieving a TICI 3 revascularization.  Post CT of brain demonstrates contrast staining in the caudate head, and the putamen. Small hyperdensity in the  occipital horn of the RT lateral  ventricle.  8 French Angio-Seal closure device deployed for hemostasis at the left groin puncture site.  Distal pulses; palpable posterior tibial on the left.  Left DP not dopplerable  Both DP and PT on the right dopplerable.  Patient left intubated.  Fatima Sanger MD.

## 2022-08-30 NOTE — Progress Notes (Signed)
   08/30/22 0120  INOMAX  $ INO Therapy Daily Yes  NO2 (ppm) 0.2 ppm  NO (ppm) 25 ppm  Oxygen (%) 97 %  Tank PSI 1800 PSI

## 2022-08-30 NOTE — Progress Notes (Signed)
LVAD flow 3.6, CVP 17  iNO decreased from 30 to 25ppm  LVAD flows now 3.7, CVP 18

## 2022-08-30 NOTE — Progress Notes (Signed)
Patient ID: Charles Holmes, male   DOB: [birth year], 57 y.o.   MRN: [id number]    Progress Note from the Palliative Medicine Team at [facility]   Patient Name: Charles Holmes        Date: 08/30/2022 DOB: 1965-06-08  Age: 57 y.o. MRN#: [id number] Attending Physician: Erick Blinks, MD Primary Care Physician: [name], PA-C Admit Date: 08/10/2022    Extensive chart review has been completed prior to meeting with patient  including labs, vital signs, imaging, progress/consult notes, orders, medications and available advance directive documents.   57 y.o. 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  Full assessment and workup; status post LVAD implant on [phone].  Postop day 1 code stroke called, CT significant for acute right MCA infarct, status post thrombectomy.   This NP assessed patient at the bedside as a follow up for palliative medicine needs and emotional support, daughter/Kayla at bedside.  Created space and opportunity for family to explore thoughts and feelings on current medical situation.  Patient's daughter tells me that she understands the seriousness of her father's current medical situation however she places contrast in God's plan and finds great comfort in her faith.  Family remain hopeful for improvement  Emotional support offered.     PMT will continue to support patient and family holistically.  Education offered today regarding  the importance of continued conversation within  family/support system and the medical providers regarding overall plan of care and treatment options,  ensuring decisions are within the context of the patients values and GOCs.  Questions and concerns addressed   Discussed with bedside RN   Time: 25 minutes     Lorinda Creed NP  Palliative Medicine Team Team Phone # [phone] Pager [phone]

## 2022-08-30 NOTE — Progress Notes (Signed)
Seen in room Family at bedside Going for CT soon.  Remains intubated/sedated Per RN, not able to wean sedation much. Some movement of RUE, no movement on left side as of yet.  (L)groin soft, NT, no hematoma  NIR cont to follow  Brayton El PA-C Interventional Radiology 08/30/2022 3:51 PM

## 2022-08-30 NOTE — Progress Notes (Signed)
HeartMate 3 Rounding Note  POD #1  Subjective:   57 year old male with cardiac sarcoidosis, nonischemic cardiomyopathy, and advanced heart failure refractory to optimal medical therapy underwent HeartMate 3 implantation on August 13 and required preoperative support with intra-aortic balloon pump and inotropic therapy.  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  Intraoperatively the patient had moderate RV dysfunction and following VAD implantation there was some obstruction to the inflow through the mitral valve which was not clearly defined as the inflow cannula was in good position and far removed from the mitral valve.  After application of a left atrial clip the inflow appeared to be improved.  The patient had stable night hemodynamically with VAD flow greater than 4 L/min.  Early a.m. postop day 1 she was noted to have unilateral weakness and underwent head CT scan and neuroradiological intervention of a right middle cerebral branch.   LVAD INTERROGATION:  HeartMate II LVAD:  Flow 3.9 liters/min, speed 5300 rpm, power 3.6, PI 4.1.  Controller intact.   Objective:    Vital Signs:   Temp:  [98.6 F (37 C)-99.9 F (37.7 C)] 98.6 F (37 C) (08/14 0900) Pulse Rate:  [29-193] 85 (08/14 0900) Resp:  [8-22] 16 (08/14 0900) BP: (93-103)/(56-77) 96/66 (08/14 0400) SpO2:  [95 %-100 %] 100 % (08/14 0900) Arterial Line BP: (82-104)/(45-86) 104/84 (08/14 0900) FiO2 (%):  [50 %-100 %] 70 % (08/14 1024) Weight:  [83.3 kg] 83.3 kg (08/14 0500) Last BM Date : 08/28/22 Mean arterial Pressure 88-92 mm Hg  Intake/Output:  Exam  General -sedated on ventilator HEENT:-No bleeding from mouth at extraction sits Neck: supple. JVP . Carotids without bruits. No lymphadenopathy or thryomegaly appreciated. Cor: Mechanical heart sounds with  LVAD hum present. Lungs: clear Abdomen: soft, nontender, nondistended. No hepatosplenomegaly. No bruits or masses. Good bowel sounds. Extremities: no cyanosis, clubbing, rash, edema Neuro: alert & orientedx3, cranial nerves grossly intact. moves all 4 extremities w/o difficulty. Affect pleasant  Telemetry: Atrially paced 90/min  Labs: Basic Metabolic Panel: Recent Labs  Lab 08/25/22 0900 08/25/22 1226 08/28/22 0405 08/29/22 0409 08/29/22 0836 08/29/22 1226 08/29/22 1306 08/29/22 1310 08/29/22 1535 08/29/22 1643 08/29/22 2215 08/30/22 0258 08/30/22 0304  NA 128*   < > 132* 132*   < > 135 138   < > 138 137 135  132* 133* 135  K 3.6   < > 4.1 4.2   < > 4.4 3.9   < > 3.6 4.4 4.5  4.4 4.8 4.9  CL 84*   < > 96* 97*   < > 98 98  --  103  --  101 103  --   CO2 34*   < > 29 27  --   --   --   --  27  --  21* 23  --   GLUCOSE 146*   < > 89 93   < > 153* 121*  --  91  --  137* 118*  --   BUN 38*   < > 15 12   < > 11 11  --  9  --  11 11  --   CREATININE 1.13   < > 0.80 0.74   < > 0.60* 0.60*  --  0.79  --  0.90 0.84  --   CALCIUM 9.1   < > 8.7* 8.5*  --   --   --   --  7.2*  --  7.7* 8.0*  --   MG  --    < > 1.8 1.9  --   --   --   --  1.5*  --  2.6* 2.3  --   PHOS 4.4  --   --   --   --   --   --   --   --   --   --  3.1  --    < > = values in this interval not displayed.    Liver Function Tests: Recent Labs  Lab 08/25/22 0900 08/27/22 0446 08/30/22 0258  AST  --  27 218*  ALT  --  38 45*  ALKPHOS  --  73 46  BILITOT  --  1.0 4.4*  PROT  --  5.3* 5.1*  ALBUMIN 3.3* 2.9* 3.2*   No results for input(s): "LIPASE", "AMYLASE" in the last 168 hours. No results for input(s): "AMMONIA" in the last 168 hours.  CBC: Recent Labs  Lab 08/28/22 0905 08/29/22 0409 08/29/22 0836 08/29/22 1110 08/29/22 1132 08/29/22 1535 08/29/22 1643 08/29/22 2215 08/30/22 0258 08/30/22 0304  WBC 7.9 6.9  --   --   --  12.5*  --  11.0* 11.6*  --   NEUTROABS  --   --   --   --   --   --    --   --  9.9*  --   HGB 10.3* 10.1*   < > 7.1*   < > 9.1* 9.9* 9.5*  9.0* 9.0* 9.2*  HCT 32.8* 32.3*   < > 22.4*   < > 28.2* 29.0* 28.0*  28.2* 27.3* 27.0*  MCV 102.8* 105.9*  --   --   --  99.6  --  99.6 96.8  --   PLT 162 160  --  90*  --  144*  143*  --  163 167  --    < > = values in this interval not displayed.    INR: Recent Labs  Lab 08/28/22 0923 08/29/22 1535 08/30/22 0258  INR 1.1 1.4* 1.4*    Other results: EKG:   Imaging: CT ANGIO HEAD NECK W WO CM W PERF (CODE STROKE)  Result Date: 08/30/2022 CLINICAL DATA:  Left-sided weakness. EXAM: CT ANGIOGRAPHY HEAD AND NECK CT PERFUSION BRAIN TECHNIQUE: Multidetector CT imaging of the head and neck was performed using the standard protocol during bolus administration of intravenous contrast. Multiplanar CT image reconstructions and MIPs were obtained to evaluate the vascular anatomy. Carotid stenosis measurements (when applicable) are obtained utilizing NASCET criteria, using the distal internal carotid diameter as the denominator. Multiphase CT imaging of the brain was performed following IV bolus contrast injection. Subsequent parametric perfusion maps were calculated using RAPID software. RADIATION DOSE REDUCTION: This exam was performed according to the departmental dose-optimization program which includes automated exposure control, adjustment of the mA and/or kV according to patient size and/or use of iterative reconstruction technique. CONTRAST:  OMNIPAQUE IOHEXOL 350 MG/ML SOLN COMPARISON:  Noncontrast head CT from earlier today. FINDINGS: CTA NECK FINDINGS Aortic arch: Changes of recent cardiopulmonary bypass. No acute finding including dissection. Right carotid system: Mild atheromatous plaque at the bifurcation. No stenosis or ulceration. Left carotid system: Vessels are smoothly contoured and widely patent. Vertebral arteries: No proximal subclavian stenosis. Left dominant vertebral artery. Vessels are smoothly contoured  and diffusely patent Skeleton: No acute finding Other neck: Unremarkable hardware traversing the neck. No acute finding Upper chest: Changes of recent  median sternotomy with chest tubes and small bilateral pneumothorax. Review of the MIP images confirms the above findings CTA HEAD FINDINGS Anterior circulation: Right M1 occlusion with luminal clot appearance. There is some downstream branch reconstitution with underfilling. No additional embolus is noted. Negative for aneurysm. Posterior circulation: Strong left vertebral artery dominance. The right vertebral artery ends in the PICA. Fetal type right PCA flow. No branch occlusion, beading, or aneurysm Venous sinuses: Diffusely patent Anatomic variants: None significant Review of the MIP images confirms the above findings CT Brain Perfusion Findings: ASPECTS: 7 CBF (<30%) Volume: 12mL Perfusion (Tmax>6.0s) volume: Mismatch Volume: 90mL Infarction Location:In the right MCA distribution, especially at the basal ganglia. The core infarct is somewhat underestimated relative to the aspects score especially at the temporal cortex. Neurology is already aware of the findings as confirmed in epic chat. IMPRESSION: 1. 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 2. Core infarct of 12 cc (somewhat underestimated compared to aspects) with 90 cc of penumbra. 3. Mild atherosclerosis. Electronically Signed   By: Tiburcio Pea M.D.   On: 08/30/2022 06:24   CT HEAD CODE STROKE WO CONTRAST  Result Date: 08/30/2022 CLINICAL DATA:  Code stroke.  Left-sided weakness EXAM: CT HEAD WITHOUT CONTRAST TECHNIQUE: Contiguous axial images were obtained from the base of the skull through the vertex without intravenous contrast. RADIATION DOSE REDUCTION: This exam was performed according to the departmental dose-optimization program which includes automated exposure control, adjustment of the mA and/or kV according to patient size and/or use of iterative reconstruction  technique. COMPARISON:  07/11/2022 FINDINGS: Brain: Cytotoxic edema in the lateral right temporal cortex, putamen, and caudate head. No acute hemorrhage. No hydrocephalus or masslike finding. Vascular: No hyperdense vessel or unexpected calcification. Skull: Normal. Negative for fracture or focal lesion. Sinuses/Orbits: No acute finding. Other: These results were communicated to Dr. Derry Lory at 5:37 am on 08/30/2022 by epic chat. ASPECTS [facility] Stroke Program Early CT Score) - Ganglionic level infarction (caudate, lentiform nuclei, internal capsule, insula, M1-M3 cortex): 4 - Supraganglionic infarction (M4-M6 cortex): 3 Total score (0-10 with 10 being normal): 7 IMPRESSION: 1. 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 ASPECTS is 7. 2. No acute hemorrhage. Electronically Signed   By: Tiburcio Pea M.D.   On: 08/30/2022 05:38   ECHO INTRAOPERATIVE TEE  Result Date: 08/29/2022  *INTRAOPERATIVE TRANSESOPHAGEAL REPORT *  Patient Name:   Charles Holmes Date of Exam: 08/29/2022 Medical Rec #:  [id number]       Height:       67.0 in Accession #:    [id number]      Weight:       169.3 lb Date of Birth:  03-Nov-1965       BSA:          1.88 m Patient Age:    57 years        BP:           140/50 mmHg Patient Gender: M               HR:           75 bpm. Exam Location:  Inpatient Transesophogeal exam was perform intraoperatively during surgical procedure. Patient was closely monitored under general anesthesia during the entirety of examination. Indications:     Cardiac surgery, Left ventricular assist device placement. Performing Phys: [id number]   Complications: No known complications during this procedure. POST-OP IMPRESSIONS _ Right Ventricle: moderately reduced function. _ Aorta: The  aorta appears unchanged from pre-bypass. _ Left Atrial Appendage: The left atrial appendage appears unchanged from pre-bypass. _ Aortic Valve: There is mild aortic insufficiency post bypass. The aortic  valve is not visualized opening. _ Mitral Valve: There is Mild to moderate regurgitation. _ Tricuspid Valve: Moderate regurgitation post repair. _ Pulmonic Valve: The pulmonic valve appears unchanged from pre-bypass. _ Pericardium: The pericardium appears unchanged from pre-bypass. _ Comments: Following placement of left ventricular assist device, left ventricular function remains severely reduced. Right ventricular function is moderately reduced. There is moderate to severe tricuspid regurgitation. There is thickening of the tissue surrounding the circumflex artery visualized near the AV groove. The thickened tissue was seen collapsing into the left atrium when the left ventricle was underfilled, causing woresening LV filling and reduction in VAD flows. Prior to leaving OR, mitral valve appeared normal in structure. There was slight improvement in MR. The inflow to the left ventricle remained patent, with laminar flow, despite some invagination of thickened tissue into the left atrium. PRE-OP FINDINGS  Left Ventricle: The left ventricle has severely reduced systolic function, with an ejection fraction of 20-30% 15%. The cavity size was severely dilated. There is no increase in left ventricular wall thickness. Left ventricular diffuse hypokinesis. Right Ventricle: The right ventricle has mildly reduced systolic function. The cavity was normal. There is no increase in right ventricular wall thickness. Left Atrium: Left atrial size was dilated. No left atrial/left atrial appendage thrombus was detected. The left atrial appendage is well visualized and there is no evidence of thrombus present. Right Atrium: Right atrial size was normal in size. Interatrial Septum: No atrial level shunt detected by color flow Doppler. Agitated saline contrast bubble study was negative, with no evidence of any interatrial shunt. There is no evidence of a patent foramen ovale. Pericardium: There is no evidence of pericardial effusion.  There is no pleural effusion. Mitral Valve: The mitral valve is normal in structure. Mitral valve regurgitation is moderate by color flow Doppler. The MR jet is centrally-directed. Tricuspid Valve: The tricuspid valve was normal in structure. Tricuspid valve regurgitation is moderate by color flow Doppler. The jet is directed centrally. Aortic Valve: The aortic valve is normal in structure. Aortic valve regurgitation is mild by color flow Doppler. The jet is centrally-directed. There is no stenosis of the aortic valve, with a calculated valve area of 2.02 cm. Pulmonic Valve: The pulmonic valve was normal in structure. Pulmonic valve regurgitation is trivial by color flow Doppler. Aorta: The aortic root, ascending aorta and aortic arch are normal in size and structure. Shunts: There is no evidence of an atrial septal defect. +--------------+--------++ LEFT VENTRICLE         +--------------+--------++ PLAX 2D                +--------------+--------++ LVOT diam:    2.30 cm  +--------------+--------++ LVOT Area:    4.15 cm +--------------+--------++                        +--------------+--------++ +------------------+-----------++ AORTIC VALVE                  +------------------+-----------++ AV Area (Vmax):   2.16 cm    +------------------+-----------++ AV Area (Vmean):  2.04 cm    +------------------+-----------++ AV Area (VTI):    2.02 cm    +------------------+-----------++ AV Vmax:          93.20 cm/s  +------------------+-----------++ AV Vmean:  62.200 cm/s +------------------+-----------++ AV VTI:           0.138 m     +------------------+-----------++ AV Peak Grad:     3.5 mmHg    +------------------+-----------++ AV Mean Grad:     2.0 mmHg    +------------------+-----------++ LVOT Vmax:        48.40 cm/s  +------------------+-----------++ LVOT Vmean:       30.600 cm/s +------------------+-----------++ LVOT VTI:         0.067 m      +------------------+-----------++ LVOT/AV VTI ratio:0.49        +------------------+-----------++  +------------+-------++ AORTA               +------------+-------++ Ao STJ diam:2.8 cm  +------------+-------++ Ao Asc diam:3.00 cm +------------+-------++ +---------------+-----------++ TRICUSPID VALVE            +---------------+-----------++ TR Peak grad:  36.5 mmHg   +---------------+-----------++ TR Vmax:       302.00 cm/s +---------------+-----------++  +--------------+-------+ SHUNTS                +--------------+-------+ Systemic VTI: 0.07 m  +--------------+-------+ Systemic Diam:2.30 cm +--------------+-------+  Anice Paganini Electronically signed by Anice Paganini Signature Date/Time: 08/29/2022/4:09:24 PM    Final    DG Chest Port 1 View  Result Date: 08/29/2022 CLINICAL DATA:  LVAD placement. EXAM: PORTABLE CHEST 1 VIEW COMPARISON:  Chest radiograph dated 08/28/2022. FINDINGS: Endotracheal tube with tip approximately 5 cm above the carina. Right IJ Swan-Ganz with tip over the mediastinum likely in the proximal right main pulmonary artery. Interval removal of the right-sided PICC. There has been placement of a left ventricular assist device. Inferiorly approached drainage catheters noted. Left pectoral AICD device. Median sternotomy wires and stable cardiomegaly. Left mid lung field atelectasis. No pleural effusion or pneumothorax. IMPRESSION: 1. Support apparatus as above. 2. Left mid lung field atelectasis. Electronically Signed   By: Elgie Collard M.D.   On: 08/29/2022 16:08   EP STUDY  Result Date: 08/29/2022 See surgical note for result.  DG CHEST PORT 1 VIEW  Result Date: 08/28/2022 CLINICAL DATA:  Cardiogenic shock. EXAM: PORTABLE CHEST 1 VIEW COMPARISON:  Chest x-ray dated August 28, 2022 FINDINGS: Unchanged enlargement of the cardiac contours. Interval removal of intra-aortic balloon pump. Unchanged position of right IJ approach PA catheter and right arm  PICC. Left chest wall pacer with unchanged lead positioning. Lungs are clear. No evidence of pleural effusion or pneumothorax. Old right-sided rib fractures. IMPRESSION: 1. Interval removal of intra-aortic balloon pump. 2. Stable support devices. 3. No new airspace opacity. Electronically Signed   By: Allegra Lai M.D.   On: 08/28/2022 13:26     Medications:     Scheduled Medications:  acetaminophen  1,000 mg Oral Q6H   Or   acetaminophen (TYLENOL) oral liquid 160 mg/5 mL  1,000 mg Per Tube Q6H   aspirin EC  325 mg Oral Daily   Or   aspirin  324 mg Per Tube Daily   Or   aspirin  300 mg Rectal Daily   atorvastatin  80 mg Oral Daily   bisacodyl  10 mg Oral Daily   Or   bisacodyl  10 mg Rectal Daily   chlorhexidine  15 mL Mouth/Throat TID   Chlorhexidine Gluconate Cloth  6 each Topical Q0600   docusate  200 mg Per Tube Daily   dorzolamide-timolol  1 drop Both Eyes BID   latanoprost  1 drop Both Eyes QHS   metoCLOPramide (REGLAN) injection  10 mg  Intravenous Q6H   mexiletine  250 mg Per Tube BID   mometasone-formoterol  2 puff Inhalation BID   [START ON 08/31/2022] multivitamin with minerals  1 tablet Per Tube Daily   mouth rinse  15 mL Mouth Rinse Q2H   [START ON 08/31/2022] pantoprazole  40 mg Oral Daily   sodium chloride flush  3 mL Intravenous Q12H   sodium chloride flush  3 mL Intravenous Q12H   umeclidinium bromide  1 puff Inhalation Daily    Infusions:  sodium chloride Stopped (08/30/22 0516)   sodium chloride     sodium chloride     sodium chloride 10 mL/hr at 08/30/22 0900   sodium chloride     sodium chloride     albumin human Stopped (08/30/22 0319)    ceFAZolin (ANCEF) IV Stopped (08/30/22 0708)   clevidipine     dexmedetomidine (PRECEDEX) IV infusion 1.2 mcg/kg/hr (08/30/22 0900)   epinephrine 5 mcg/min (08/30/22 0900)   famotidine (PEPCID) IV 20 mg (08/30/22 1034)   insulin Stopped (08/30/22 4540)   lactated ringers     lactated ringers     lactated  ringers 100 mL/hr at 08/30/22 0900   milrinone 0.5 mcg/kg/min (08/30/22 0900)   norepinephrine (LEVOPHED) Adult infusion Stopped (08/30/22 9811)   vancomycin Stopped (08/29/22 2051)    PRN Medications: sodium chloride, sodium chloride, acetaminophen, albumin human, albuterol, dextrose, lactated ringers, midazolam, morphine injection, ondansetron (ZOFRAN) IV, mouth rinse, mouth rinse, oxyCODONE, sodium chloride flush, sodium chloride flush, traMADol   Assessment:  57 year old patient with sarcoidosis, COPD history of stroke  and nonischemic cardiomyopathy with preoperative cardiogenic shock requiring balloon pump and inotropic therapy.  Postoperative RV dysfunction, right middle cerebral artery CVA treated with neuro-- radiology invention, acute renal injury   Plan/Discussion:   Continue sedation and mechanical ventilation Hold anticoagulation with INR 1.6 and elevated postoperative bilirubin 4.1.  Low-dose heparin may be needed pending results of follow-up head CT. Continue inhaled nitric oxide until patient is ready for ventilator weaning and extubation Continue low-dose epinephrine for RV dysfunction  I reviewed the LVAD parameters from today, and compared the results to the patient's prior recorded data.  No programming changes were made.  The LVAD is functioning within specified parameters.  The patient performs LVAD self-test daily.  LVAD interrogation was negative for any significant power changes, alarms or PI events/speed drops.  LVAD equipment check completed and is in good working order.  Back-up equipment present.   LVAD education done on emergency procedures and precautions and reviewed exit site care.  Length of Stay: 20  Lovett Sox 08/30/2022, 10:53 AM

## 2022-08-30 NOTE — Progress Notes (Signed)
Nutrition Follow-up  DOCUMENTATION CODES:   Not applicable  INTERVENTION:   If unable to extubate within 24 hours, recommend initiation of TF   Tube Feeding Recommendations:  Pivot 1.5 at 55 ml/hr Recommend starting at 20 ml/hr with titration by 10 mL q 8 hours until goal rate of 55 ml/hr TF at goal rate provide 124 g of protein, 1980 kcals and 1003 mL of free water  NUTRITION DIAGNOSIS:   Inadequate oral intake related to catabolic illness, poor appetite as evidenced by per patient/family report.  Continues  GOAL:   Patient will meet greater than or equal to 90% of their needs  Not Met  MONITOR:   PO intake, Supplement acceptance, Labs, Weight trends  REASON FOR ASSESSMENT:   Consult LVAD Eval  ASSESSMENT:   57 yo male admitted from cardiologist office with worsening fatigue, shortness of breath and poor appetite with acute on chronic HF with significant reduced LV dysfunction (EF 20%) and preserved RV function. Pt being evaluated for possible LVAD placement. PMH includes cardiac and pulmonary sarcoidosis, CAD, GERD, HLD, HTN. Noted recent hospitalization from 6/24-6/30 this year with acute emoblic CVA.   7/25 Admitted 7/26 LVAD work-up initiated 8/06 Dental extraction (13 teeth) 8/09 IABP placed 8/13 HM3 LVAD implanted, IABP removed 8/14 Early AM-Code Stroke, CT with acute R MCA stroke with R MCA M1 occlusion requiring thrombectomy  Pt remains on vent support, remains on iNO  Epinephrine at 3, levophed currently off  Chest tubes x 4 (L pleural, R Pleural, Mediastinal x 2) with total of 650 mL in 24 hours, 160 mL thus far today  Significant volume received in the last 24 hours (>7 L), UOP 1.12 L. Net +4.5 L in last 24 hours  Weight up to 83.3 kg this AM, wt of 76.8 kg prior to OR yesterday. Lowest wt this admission around 72 kg. Significant edema present on exam  Labs: sodium 135 (wdl), magnesium 2.3 (wdl), potassium 4.9 (wdl) Meds:  reviewed  Diet Order:    Diet Order             Diet NPO time specified  Diet effective now                   EDUCATION NEEDS:   Not appropriate for education at this time  Skin:  Skin Assessment: Skin Integrity Issues: Skin Integrity Issues:: Incisions Incisions: new LVAD-driveline  Last BM:  8/12  Height:   Ht Readings from Last 1 Encounters:  08/30/22 5\' 7"  (1.702 m)    Weight:   Wt Readings from Last 1 Encounters:  08/30/22 83.3 kg    BMI:  Body mass index is 28.76 kg/m.  Estimated Nutritional Needs:   Kcal:  1800-2000 kcals  Protein:  115-140 g  Fluid:  1.8 L   Romelle Starcher MS, RDN, LDN, CNSC Registered Dietitian 3 Clinical Nutrition RD Pager and On-Call Pager Number Located in [place]

## 2022-08-30 NOTE — Progress Notes (Addendum)
STROKE TEAM PROGRESS NOTE   BRIEF HPI Charles Holmes is a 57 y.o. 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 He was noted to be moving all extremities and lift hem up of the bed around midnight. As sedation was being weaned in anticipation of eventual extubation in AM, he was noted to not be able to move his LUE and LLE. A code stroke was activated. R MCA M1 occlusion and received manual thrombectomy.    SIGNIFICANT HOSPITAL EVENTS 7/25: ED to Hospital admission 8/13: LVAD placement  INTERIM HISTORY/SUBJECTIVE Pt is in bed and intubated and highly sedated.  On vasopressor drips.  Daughter is in the room. She reports to that prior to this hospitalization that hte patient was living independently. He has had worsening cardiac output more recently which has led to some of his decline. She reports that he had a stroke in June 2024 due to right M1 occlusion and he was treated with IV tPA followed by successful mechanical thrombectomy with excellent revascularization and that recovered quite well without any residual symptoms.    OBJECTIVE  CBC    Component Value Date/Time   WBC 11.6 (H) 08/30/2022 0258   RBC 2.82 (L) 08/30/2022 0258   HGB 8.8 (L) 08/30/2022 1124   HGB 14.3 05/31/2021 0906   HCT 26.0 (L) 08/30/2022 1124   HCT 42.6 05/31/2021 0906   PLT 167 08/30/2022 0258   PLT 203 05/31/2021 0906   MCV 96.8 08/30/2022 0258   MCV 98 (H) 05/31/2021 0906   MCH 31.9 08/30/2022 0258   MCHC 33.0 08/30/2022 0258   RDW 17.2 (H) 08/30/2022 0258   RDW 15.8 (H) 05/31/2021 0906   LYMPHSABS 0.3 (L) 08/30/2022 0258   LYMPHSABS 0.9 05/31/2021 0906   MONOABS 1.3 (H) 08/30/2022 0258   EOSABS 0.0 08/30/2022 0258   EOSABS 0.1 05/31/2021 0906   BASOSABS 0.0 08/30/2022 0258   BASOSABS 0.0 05/31/2021 0906    BMET    Component Value Date/Time    NA 135 08/30/2022 1124   NA 143 05/31/2021 0906   K 4.9 08/30/2022 1124   CL 103 08/30/2022 0258   CO2 23 08/30/2022 0258   GLUCOSE 118 (H) 08/30/2022 0258   BUN 11 08/30/2022 0258   BUN 12 05/31/2021 0906   CREATININE 0.84 08/30/2022 0258   CALCIUM 8.0 (L) 08/30/2022 0258   EGFR 80 05/31/2021 0906   GFRNONAA >60 08/30/2022 0258    IMAGING past 24 hours CT ANGIO HEAD NECK W WO CM W PERF (CODE STROKE)  Result Date: 08/30/2022 CLINICAL DATA:  Left-sided weakness. EXAM: CT ANGIOGRAPHY HEAD AND NECK CT PERFUSION BRAIN TECHNIQUE: Multidetector CT imaging of the head and neck was performed using the standard protocol during bolus administration of intravenous contrast. Multiplanar CT image reconstructions and MIPs were obtained to evaluate the vascular anatomy. Carotid stenosis measurements (when applicable) are obtained utilizing NASCET criteria, using the distal internal carotid diameter as the denominator. Multiphase CT imaging of the brain was performed following IV bolus contrast injection. Subsequent parametric perfusion maps were calculated using RAPID software. RADIATION DOSE REDUCTION: This exam was performed according to the departmental dose-optimization program which includes automated exposure control, adjustment of the mA and/or kV according to patient size and/or use of iterative reconstruction technique. CONTRAST:  OMNIPAQUE IOHEXOL 350 MG/ML SOLN COMPARISON:  Noncontrast head CT from earlier today. FINDINGS:  CTA NECK FINDINGS Aortic arch: Changes of recent cardiopulmonary bypass. No acute finding including dissection. Right carotid system: Mild atheromatous plaque at the bifurcation. No stenosis or ulceration. Left carotid system: Vessels are smoothly contoured and widely patent. Vertebral arteries: No proximal subclavian stenosis. Left dominant vertebral artery. Vessels are smoothly contoured and diffusely patent Skeleton: No acute finding Other neck: Unremarkable hardware  traversing the neck. No acute finding Upper chest: Changes of recent median sternotomy with chest tubes and small bilateral pneumothorax. Review of the MIP images confirms the above findings CTA HEAD FINDINGS Anterior circulation: Right M1 occlusion with luminal clot appearance. There is some downstream branch reconstitution with underfilling. No additional embolus is noted. Negative for aneurysm. Posterior circulation: Strong left vertebral artery dominance. The right vertebral artery ends in the PICA. Fetal type right PCA flow. No branch occlusion, beading, or aneurysm Venous sinuses: Diffusely patent Anatomic variants: None significant Review of the MIP images confirms the above findings CT Brain Perfusion Findings: ASPECTS: 7 CBF (<30%) Volume: 12mL Perfusion (Tmax>6.0s) volume: Mismatch Volume: 90mL Infarction Location:In the right MCA distribution, especially at the basal ganglia. The core infarct is somewhat underestimated relative to the aspects score especially at the temporal cortex. Neurology is already aware of the findings as confirmed in epic chat. IMPRESSION: 1. 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 2. Core infarct of 12 cc (somewhat underestimated compared to aspects) with 90 cc of penumbra. 3. Mild atherosclerosis. Electronically Signed   By: Tiburcio Pea M.D.   On: 08/30/2022 06:24   CT HEAD CODE STROKE WO CONTRAST  Result Date: 08/30/2022 CLINICAL DATA:  Code stroke.  Left-sided weakness EXAM: CT HEAD WITHOUT CONTRAST TECHNIQUE: Contiguous axial images were obtained from the base of the skull through the vertex without intravenous contrast. RADIATION DOSE REDUCTION: This exam was performed according to the departmental dose-optimization program which includes automated exposure control, adjustment of the mA and/or kV according to patient size and/or use of iterative reconstruction technique. COMPARISON:  07/11/2022 FINDINGS: Brain: Cytotoxic edema in the lateral  right temporal cortex, putamen, and caudate head. No acute hemorrhage. No hydrocephalus or masslike finding. Vascular: No hyperdense vessel or unexpected calcification. Skull: Normal. Negative for fracture or focal lesion. Sinuses/Orbits: No acute finding. Other: These results were communicated to Dr. Derry Lory at 5:37 am on 08/30/2022 by epic chat. ASPECTS [facility] Stroke Program Early CT Score) - Ganglionic level infarction (caudate, lentiform nuclei, internal capsule, insula, M1-M3 cortex): 4 - Supraganglionic infarction (M4-M6 cortex): 3 Total score (0-10 with 10 being normal): 7 IMPRESSION: 1. 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 ASPECTS is 7. 2. No acute hemorrhage. Electronically Signed   By: Tiburcio Pea M.D.   On: 08/30/2022 05:38   DG Chest Port 1 View  Result Date: 08/29/2022 CLINICAL DATA:  LVAD placement. EXAM: PORTABLE CHEST 1 VIEW COMPARISON:  Chest radiograph dated 08/28/2022. FINDINGS: Endotracheal tube with tip approximately 5 cm above the carina. Right IJ Swan-Ganz with tip over the mediastinum likely in the proximal right main pulmonary artery. Interval removal of the right-sided PICC. There has been placement of a left ventricular assist device. Inferiorly approached drainage catheters noted. Left pectoral AICD device. Median sternotomy wires and stable cardiomegaly. Left mid lung field atelectasis. No pleural effusion or pneumothorax. IMPRESSION: 1. Support apparatus as above. 2. Left mid lung field atelectasis. Electronically Signed   By: Elgie Collard M.D.   On: 08/29/2022 16:08   EP STUDY  Result Date: 08/29/2022 See  surgical note for result.   Vitals:   08/30/22 0900 08/30/22 1000 08/30/22 1100 08/30/22 1200  BP:      Pulse: 85 91 (!) 30 (!) 32  Resp: 16 17 15 16   Temp: 98.6 F (37 C) 98.4 F (36.9 C) 97.9 F (36.6 C) (!) 97.5 F (36.4 C)  TempSrc: Core   Core  SpO2: 100% 100% 100% 100%  Weight:      Height:         PHYSICAL  EXAM General:  intubated.  CV: Regular rate and rhythm on monitor Respiratory:  synchronous with ventilator  NEURO:  Mental Status: intubated. Opens eyes to painful stimulus.  Speech/Language: unable to assess.   Cranial Nerves:  II: PERRL. Patient will not hold eyelids open cannot assess threatening stimulus for eye blink reflex.   III, IV, VI: unable to assess.  V: unable to assess.  VII: Face is symmetrical resting.  VIII: unable to assess.  IX, X: unable to assess.  XI: unable to assess.  XII: unable to assess.  Motor: RUE and RLE move to painful stimulus. LUE and LLE have no movement in response to painful stimulus.   Tone: is normal and bulk is normal Sensation- unable to assess.  Coordination: unable to assess.   Gait- unable to assess.    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 No acute hemorrhage. ASPECTS 7.    CTA head & neck emergent M1 occlusion.  Repeat CT head pending Echo 8/11 < 20% severely dilated LV RV mildly decreased.  LDL 51 HgbA1c 5.5 VTE prophylaxis - SCDs Eliquis (apixaban) daily prior to admission, now on No antithrombotic pending CT ordered for today. Therapy recommendations:  no PT f/u, OT pending Disposition:  pending  Hx of Stroke/TIA June 2024: R MCA infarct with M1 occlusion s/p TPA and IR with TICI3, likely due to cardiomyopathy with low EF  Atrial fibrillation Home Meds: eliquis 5 mg twice daily, carvedilol 3.125 mg twice daily Continue telemetry monitoring Begin anticoagulation once hemorrhage ruled out.   Hypertension & HFrEF Home meds:  carvedilol 3.125 mg twice daily, furosemide 40 mg twice daily, spironolactone 12.5 mg once daily Blood Pressure Goal: SBP 120-160 for first 24 hours then less than 180   Hyperlipidemia Home meds:  atorvastatin 80 mg once daily,  resumed in hospital LDL 51, goal < 70 Continue statin at discharge  Diabetes type II Controlled Home meds:  farxiga 10 mg once daily HgbA1c 5.5, goal < 7.0 Recommend close follow-up with PCP for better DM control  Dysphagia Patient has post-stroke dysphagia, SLP consulted    Diet   Diet NPO time specified   Advance diet as tolerated  Other Stroke Risk Factors ETOH use, advised to drink no more than 2 drink(s) a day Coronary artery disease Congestive heart failure   Other Active Problems Acute exacerbation HFrEF managed by cardiology Acute respiratory insufficiency, intubated managed by [facility] day # 20  Meryl Dare, MD PGY-1 Psychiatry Resident 08/30/2022, 12:52 PM  STROKE MD NOTE :  I have personally obtained history,examined this patient, reviewed notes, independently viewed imaging studies, participated in medical decision making and plan of care.ROS completed by me personally and pertinent positives fully documented  I have made any additions or clarifications directly to the above note. Agree with note above.  Patient underwent LVAD placement and subsequently  developed neurological worsening with recurrent right M1 occlusion and was treated with successful mechanical thrombectomy but remains on sedation and vasopressor support for his hemodynamic instability.  Neurological exam suggests left hemiparesis.  Recommend repeat CT scan this afternoon and if there is no hemorrhagic transformation may be placed on IV heparin to prevent further thromboembolism from his LVAD device.  Wean off ventilatory support and vasopressors as per primary cardiology team.  Long discussion with the patient's family at the bedside and answered questions.  Discussed with Dr. Gala Romney and heart failure team. This patient is critically ill and at significant risk of neurological worsening, death and care requires constant monitoring of vital signs, hemodynamics,respiratory and cardiac  monitoring, extensive review of multiple databases, frequent neurological assessment, discussion with family, other specialists and medical decision making of high complexity.I have made any additions or clarifications directly to the above note.This critical care time does not reflect procedure time, or teaching time or supervisory time of PA/NP/Med Resident etc but could involve care discussion time.  I spent 30 minutes of neurocritical care time  in the care of  this patient.      Delia Heady, MD Medical Director [facility] Pager: [phone] 08/30/2022 1:47 PM   To contact Stroke Continuity provider, please refer to [email or web address]. After hours, contact General Neurology

## 2022-08-30 NOTE — Progress Notes (Addendum)
Advanced Heart Failure Rounding Note  PCP-Cardiologist: Norman Herrlich, MD   Subjective:    7/25: Admit with cardiogenic shock. Started milrinone and NE. 8/6 S/P 13 teeth extractions  8/9 IABP placed 8/13 S/p HM III LVAD implant + clipping left atrial appendage d/t severe thickening and invagination of mitral valve annulus impeding flows  Post Op Day #1  Developed left -sided weakness  this am. Code stroke called. CT with acute R MCA infarct. IR for thrombectomy  On 5 Epi + 0.5 milrinone + 10 iNO. NE off.  CO-OX 65%.   SWAN #s CVP 13 PA 36/26 CO 2.98 CI 1.60  MAPs 90s-100s  LFTs elevated and T bili 4.4.  Sedated on vent. Neurology at bedside.  LVAD Interrogation HM III: Speed: 5200 Flow: 3.2 PI: 7.3 Power: 4.    Objective:   Weight Range: 83.3 kg Body mass index is 28.76 kg/m.   Vital Signs:   Temp:  [99 F (37.2 C)-99.9 F (37.7 C)] 99.5 F (37.5 C) (08/14 0445) Pulse Rate:  [29-193] 83 (08/14 0615) Resp:  [8-22] 19 (08/14 0630) BP: (93-103)/(56-77) 96/66 (08/14 0400) SpO2:  [95 %-100 %] 100 % (08/14 0615) Arterial Line BP: (82-103)/(45-84) 102/82 (08/14 0630) FiO2 (%):  [50 %] 50 % (08/14 0415) Weight:  [83.3 kg] 83.3 kg (08/14 0500) Last BM Date : 08/28/22  Weight change: Filed Weights   08/28/22 0500 08/29/22 0500 08/30/22 0500  Weight: 78.2 kg 76.8 kg 83.3 kg    Intake/Output:   Intake/Output Summary (Last 24 hours) at 08/30/2022 0736 Last data filed at 08/30/2022 0700 Gross per 24 hour  Intake 7231.06 ml  Output 2719 ml  Net 4512.06 ml   Physical Exam   Physical Exam: GENERAL: Sedated on vent HEENT: normal  NECK: Supple, R internal jugular SWAN.   CARDIAC:  Mechanical heart sounds with LVAD hum present.  LUNGS:  Clear to auscultation bilaterally.  ABDOMEN:  Soft, round, nontender, positive bowel sounds x4.     LVAD exit site: Dressing dry and intact.  Stabilization device present and accurately applied.   EXTREMITIES:  Warm and  dry, no cyanosis, clubbing, rash or edema  NEUROLOGIC:  Sedated     Telemetry     SR 80s with V pacing Personally reviewed     Labs    CBC Recent Labs    08/29/22 2215 08/30/22 0258 08/30/22 0304  WBC 11.0* 11.6*  --   NEUTROABS  --  9.9*  --   HGB 9.5*  9.0* 9.0* 9.2*  HCT 28.0*  28.2* 27.3* 27.0*  MCV 99.6 96.8  --   PLT 163 167  --     Basic Metabolic Panel Recent Labs    95/62/13 2215 08/30/22 0258 08/30/22 0304  NA 135  132* 133* 135  K 4.5  4.4 4.8 4.9  CL 101 103  --   CO2 21* 23  --   GLUCOSE 137* 118*  --   BUN 11 11  --   CREATININE 0.90 0.84  --   CALCIUM 7.7* 8.0*  --   MG 2.6* 2.3  --   PHOS  --  3.1  --     BNP: BNP (last 3 results) Recent Labs    07/26/22 1211 08/10/22 1142 08/30/22 0258  BNP 1,723.6* 1,905.0* 922.3*     Imaging    CT ANGIO HEAD NECK W WO CM W PERF (CODE STROKE)  Result Date: 08/30/2022 CLINICAL DATA:  Left-sided weakness. EXAM: CT ANGIOGRAPHY HEAD  AND NECK CT PERFUSION BRAIN TECHNIQUE: Multidetector CT imaging of the head and neck was performed using the standard protocol during bolus administration of intravenous contrast. Multiplanar CT image reconstructions and MIPs were obtained to evaluate the vascular anatomy. Carotid stenosis measurements (when applicable) are obtained utilizing NASCET criteria, using the distal internal carotid diameter as the denominator. Multiphase CT imaging of the brain was performed following IV bolus contrast injection. Subsequent parametric perfusion maps were calculated using RAPID software. RADIATION DOSE REDUCTION: This exam was performed according to the departmental dose-optimization program which includes automated exposure control, adjustment of the mA and/or kV according to patient size and/or use of iterative reconstruction technique. CONTRAST:  OMNIPAQUE IOHEXOL 350 MG/ML SOLN COMPARISON:  Noncontrast head CT from earlier today. FINDINGS: CTA NECK FINDINGS Aortic arch:  Changes of recent cardiopulmonary bypass. No acute finding including dissection. Right carotid system: Mild atheromatous plaque at the bifurcation. No stenosis or ulceration. Left carotid system: Vessels are smoothly contoured and widely patent. Vertebral arteries: No proximal subclavian stenosis. Left dominant vertebral artery. Vessels are smoothly contoured and diffusely patent Skeleton: No acute finding Other neck: Unremarkable hardware traversing the neck. No acute finding Upper chest: Changes of recent median sternotomy with chest tubes and small bilateral pneumothorax. Review of the MIP images confirms the above findings CTA HEAD FINDINGS Anterior circulation: Right M1 occlusion with luminal clot appearance. There is some downstream branch reconstitution with underfilling. No additional embolus is noted. Negative for aneurysm. Posterior circulation: Strong left vertebral artery dominance. The right vertebral artery ends in the PICA. Fetal type right PCA flow. No branch occlusion, beading, or aneurysm Venous sinuses: Diffusely patent Anatomic variants: None significant Review of the MIP images confirms the above findings CT Brain Perfusion Findings: ASPECTS: 7 CBF (<30%) Volume: 12mL Perfusion (Tmax>6.0s) volume: Mismatch Volume: 90mL Infarction Location:In the right MCA distribution, especially at the basal ganglia. The core infarct is somewhat underestimated relative to the aspects score especially at the temporal cortex. Neurology is already aware of the findings as confirmed in epic chat. IMPRESSION: 1. 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 2. Core infarct of 12 cc (somewhat underestimated compared to aspects) with 90 cc of penumbra. 3. Mild atherosclerosis. Electronically Signed   By: Tiburcio Pea M.D.   On: 08/30/2022 06:24   CT HEAD CODE STROKE WO CONTRAST  Result Date: 08/30/2022 CLINICAL DATA:  Code stroke.  Left-sided weakness EXAM: CT HEAD WITHOUT CONTRAST TECHNIQUE:  Contiguous axial images were obtained from the base of the skull through the vertex without intravenous contrast. RADIATION DOSE REDUCTION: This exam was performed according to the departmental dose-optimization program which includes automated exposure control, adjustment of the mA and/or kV according to patient size and/or use of iterative reconstruction technique. COMPARISON:  07/11/2022 FINDINGS: Brain: Cytotoxic edema in the lateral right temporal cortex, putamen, and caudate head. No acute hemorrhage. No hydrocephalus or masslike finding. Vascular: No hyperdense vessel or unexpected calcification. Skull: Normal. Negative for fracture or focal lesion. Sinuses/Orbits: No acute finding. Other: These results were communicated to Dr. Derry Lory at 5:37 am on 08/30/2022 by epic chat. ASPECTS [facility] Stroke Program Early CT Score) - Ganglionic level infarction (caudate, lentiform nuclei, internal capsule, insula, M1-M3 cortex): 4 - Supraganglionic infarction (M4-M6 cortex): 3 Total score (0-10 with 10 being normal): 7 IMPRESSION: 1. 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 ASPECTS is 7. 2. No acute hemorrhage. Electronically Signed   By: Tiburcio Pea M.D.   On: 08/30/2022  05:38   DG Chest Port 1 View  Result Date: 08/29/2022 CLINICAL DATA:  LVAD placement. EXAM: PORTABLE CHEST 1 VIEW COMPARISON:  Chest radiograph dated 08/28/2022. FINDINGS: Endotracheal tube with tip approximately 5 cm above the carina. Right IJ Swan-Ganz with tip over the mediastinum likely in the proximal right main pulmonary artery. Interval removal of the right-sided PICC. There has been placement of a left ventricular assist device. Inferiorly approached drainage catheters noted. Left pectoral AICD device. Median sternotomy wires and stable cardiomegaly. Left mid lung field atelectasis. No pleural effusion or pneumothorax. IMPRESSION: 1. Support apparatus as above. 2. Left mid lung field atelectasis.  Electronically Signed   By: Elgie Collard M.D.   On: 08/29/2022 16:08   EP STUDY  Result Date: 08/29/2022 See surgical note for result.    Medications:     Scheduled Medications:  acetaminophen  1,000 mg Oral Q6H   Or   acetaminophen (TYLENOL) oral liquid 160 mg/5 mL  1,000 mg Per Tube Q6H   aspirin EC  325 mg Oral Daily   Or   aspirin  324 mg Per Tube Daily   Or   aspirin  300 mg Rectal Daily   atorvastatin  80 mg Oral Daily   bisacodyl  10 mg Oral Daily   Or   bisacodyl  10 mg Rectal Daily   chlorhexidine  15 mL Mouth/Throat TID   Chlorhexidine Gluconate Cloth  6 each Topical Q0600   docusate sodium  200 mg Oral Daily   dorzolamide-timolol  1 drop Both Eyes BID   latanoprost  1 drop Both Eyes QHS   metoCLOPramide (REGLAN) injection  10 mg Intravenous Q6H   mexiletine  250 mg Per Tube BID   mometasone-formoterol  2 puff Inhalation BID   multivitamin with minerals  1 tablet Oral Daily   mouth rinse  15 mL Mouth Rinse Q2H   [START ON 08/31/2022] pantoprazole  40 mg Oral Daily   sodium chloride flush  3 mL Intravenous Q12H   sodium chloride flush  3 mL Intravenous Q12H   umeclidinium bromide  1 puff Inhalation Daily    Infusions:  sodium chloride Stopped (08/30/22 0516)   sodium chloride     sodium chloride     sodium chloride 10 mL/hr at 08/30/22 0700   sodium chloride     albumin human Stopped (08/30/22 0319)    ceFAZolin (ANCEF) IV 200 mL/hr at 08/30/22 0700   dexmedetomidine (PRECEDEX) IV infusion 1.2 mcg/kg/hr (08/30/22 0700)   epinephrine 5 mcg/min (08/30/22 0700)   famotidine (PEPCID) IV Stopped (08/29/22 2144)   fluconazole (DIFLUCAN) IV 100 mL/hr at 08/30/22 0700   insulin Stopped (08/30/22 0634)   lactated ringers     lactated ringers     lactated ringers 20 mL/hr at 08/30/22 0700   milrinone 0.5 mcg/kg/min (08/30/22 0700)   norepinephrine (LEVOPHED) Adult infusion Stopped (08/30/22 0625)   rifampin (RIFADIN) 600 mg in sodium chloride 0.9 % 100 mL  IVPB     vancomycin Stopped (08/29/22 2051)    PRN Medications: sodium chloride, sodium chloride, acetaminophen, albumin human, albuterol, dextrose, iohexol, lactated ringers, midazolam, morphine injection, nitroGLYCERIN 25 mcg in sodium chloride (PF) 0.9 % 59.71 mL (25 mcg/mL) syringe, ondansetron (ZOFRAN) IV, mouth rinse, mouth rinse, oxyCODONE, sodium chloride flush, sodium chloride flush, traMADol   Patient Profile  Charles Holmes is a 57 y.o. 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 Admitted with cardiogenic shock.  Assessment/Plan   1.  Acute on chronic Systolic HF-->Cardiogenic Shock  - Diagnosed 11/2019. Presented with VT. LHC 70% LAD  - cMRI 12/21 concerning for sarcoid and EF 18%.  - PET 2/22 at [facility] EF 25% + active sarcoid - Echo 08/26/20 EF < 20% severely dilated LV RV mildly decreased.  - [employer] CRT-D upgrade in 06/08/21 - Echo 07/10/22: EF <20%, RV okay, mod pericardial effusion, mod Charles/TR - Admitted 07/25 with cardiogenic shock. - RHC: Nonobstructive CAD, severely elevated filling pressures and low Fick CO/CI (2.7/1.4) - 08/13 HM III LVAD implant + clipping LAA d/t severe thickening and invagination of mitral valve annulus impeding flows.  - Apical core sent to pathology to confirm diagnosis of cardiac sarcoid >> pending - On milrinone 0.5 + Epi 5 with CI of 1.6. Flow 3.2.  Increase speed to 5300 to help with flows. Adjusted pacer, AV paced 90s. Recheck in a couple of hours.  - MAP 90s-100s. Wean Epi. Target MAP 70-90.  - Plan to start low-dose heparin this afternoon if no evidence of bleed on repeat CT head  2.  Acute stroke Hx CVA 06/24 -Admitted 06/24 w/ R MCA stroke. S/p TPA and mechanical clot extraction. No residual deficits. Likely cardioembolic in setting of severe LV dysfunction. - Developed left sided weakness this am. CTA with R MCA infarct. Taken to IR for thrombectomy - Discussed with Neurology at  bedside. Recheck CT head this afternoon.   3. 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 Continue mexilitene  - now s/p ICD.   4. CAD - LHC 12/07/19 70-% LAD, no intervention - LHC 8/24 non obstructive CAD.  - No s/s angina - Continue statin. On aspirin for VAD.  5. Cardiac sarcoid - PET 2/22 at [facility] EF 25% + active sarcoid - Has completed prednisone.  - Will stop methotrexate with upcoming surgery.  - Will send apical core to pathology to confirm diagnosis of cardiac sarcoid  6. Paroxsymal AT/AF - Currently in SR - Anticoagulation as above   7. AKI - suspect cardiorenal, improved w/ inotropic support - Creatinine now improved to 0.8 - follow BMP    8. Iron deficiency anemia - recent T sat 15%, scheduled for OP feraheme. Will complete inpatient    9. Pulmonary  - PFTs with severe obstructive defect, response to bronchodilator. FEV1 1.04L, FEV1/FVC 48% - Remains intubated post CVA  10. Elevated LFTs Elevated T bili -Post-op -AST 218, ALT 45, Tbili 4.4 -Follow  Length of Stay: 20  FINCH, LINDSAY N, PA-C  08/30/2022, 7:36 AM  VAD Team Pager [phone] (7am - 7am)   Advanced Heart Failure Team Pager [phone] (M-F; 7a - 5p)  Please contact [facility] for night-coverage after hours (5p -7a ) and weekends on [email or web address]  Agree with above.   Developed acute stroke overnight and taken for acute thrombectomy. Now back in ICU.   On EPI and milrinone. Flows on VAD remain marginal MAPs elevated  General:  Intubated/sedated  HEENT: +ETT  + scleral icterus  Neck: supple. JRIJ swan Carotids 2+ bilat; no bruits. No lymphadenopathy or thryomegaly appreciated. Cor: Sternal dressing and CTs ok LVAD hum.  Lungs: Clear. Abdomen: soft, nontender, non-distended. No hepatosplenomegaly. No bruits or masses. Good bowel sounds. Driveline site clean. Anchor in place.  Extremities: no cyanosis, clubbing, rash. Warm trace  edema  Neuro:  intubated/sedated  Had acute CVA overnight. Now s/p thrombectomy.   VAD flows remain marginal in setting  of swelling/invagination of posterior mitral annulus.   Will adjust speed as tolerated. Adjust inotropes pressors to keep MAP 80-90 and CI > 2.0.   Repeat CT head this afternoon. If no bleeding can start low-dose heparin. (D/w Neuro at bedside)  In reviewing operative TEE suspect localized hematoma of posterior MV annulus and suspect it should improve with time.   Keep intubated until head CT complete then can wean vent.   CRITICAL CARE Performed by: Arvilla Meres  Total critical care time: 50 minutes  Critical care time was exclusive of separately billable procedures and treating other patients.  Critical care was necessary to treat or prevent imminent or life-threatening deterioration.  Critical care was time spent personally by me (independent of midlevel providers or residents) on the following activities: development of treatment plan with patient and/or surrogate as well as nursing, discussions with consultants, evaluation of patient's response to treatment, examination of patient, obtaining history from patient or surrogate, ordering and performing treatments and interventions, ordering and review of laboratory studies, ordering and review of radiographic studies, pulse oximetry and re-evaluation of patient's condition.  Arvilla Meres, MD  3:52 PM

## 2022-08-30 NOTE — Significant Event (Signed)
Significant Event Note:  At 0400, pt noted to have new-onset left-side weakness (not moving LUE, minimal movement LLE). VAD coordinatory and surgeon Dr. Maren Beach notified, Code Stroke called, pt taken to CT with myself, other primary RN Dorma Russell, 2 RTs, RR RN, and another transporter. Neuro MD Dr. Derry Lory met at bedside to assess prior to transport and met [place] in CT. CT showed acute infarct to R MCA, per MD went to IR. Pt transported to IR, met by VAD coordinator Revonda Standard there. Dr. Maren Beach also made aware. Dr. Derry Lory updated pt's daughter Dorathy Daft and obtained consent prior to IR.   Pt left in LVAD coordinator, CRNA Marchelle Folks, and IR team's care at [phone]. LVAD cart and emergency equipment in IR 3 with pt.  Pt hemodynamically stable throughout. On 5 EPI, 0.5 Milrinone, 1.2 Dex, and 10ppm iNO as of 0655. VAD numbers unchanged. No alarms.   Peyton Bottoms, RN 08/30/22

## 2022-08-30 NOTE — Progress Notes (Signed)
   08/30/22 0415  INOMAX  $ INO Therapy Daily Yes  NO2 (ppm) 0 ppm  NO (ppm) 10 ppm  Oxygen (%) 97 %  Tank PSI 1800 PSI  Adult Ventilator Measurements  SpO2 98 %

## 2022-08-30 NOTE — Progress Notes (Addendum)
OT Cancellation Note  Patient Details Name: Charles Holmes MRN: [id number] DOB: 02/05/1965   Cancelled Treatment:    Reason Eval/Treat Not Completed: Active bedrest order; intubated, with RR called this AM  Carver Fila, OTD, OTR/L SecureChat Preferred Acute Rehab [phone]   Dalphine Handing 08/30/2022, 7:17 AM

## 2022-08-30 NOTE — Progress Notes (Signed)
LVAD flow 3.6, CVP 14, MAP 82  iNO decreased from 20 to 15ppm  Flows now 3.7, CVP 15, MAP 83

## 2022-08-30 NOTE — Progress Notes (Signed)
VAD Coordinator Procedure Note:   VAD Coordinator met patient in IR. Pt undergoing right common carotid arteriogram per Dr. Corliss Skains. Hemodynamics and VAD parameters monitored by myself and anesthesia throughout the procedure. Blood pressures were obtained with arterial line.     Time: Doppler Auto  BP Flow PI Power Speed  Pre-procedure:  0655  83/61 (79) 3.3 4.9 3.5 5200                    Sedation Induction: 0700  105/75 (97) 3.4 5.0 3.6 5200   0715  110/90 (101) 3.2 6.3 3.5 5200   0730  107/75 (76) 3.4 5.5 3.6 5200           Recovery Area: 0735  114/90 (103) 3.1 6.6 3.5 5200             Patient Disposition: Patient tolerated the procedure well. VAD Coordinator transported back to 2H with help of 2H staff.   See Dr Fatima Sanger note for procedure details.   [name] RN VAD Coordinator  Office: [phone]  24/7 Pager: [phone]

## 2022-08-30 NOTE — Anesthesia Preprocedure Evaluation (Addendum)
Anesthesia Evaluation  Patient identified by MRN, date of birth, ID bandGeneral Assessment Comment:sedated  Reviewed: Allergy & Precautions, NPO status , Patient's Chart, lab work & pertinent test results, Unable to perform ROS - Chart review only  History of Anesthesia Complications Negative for: history of anesthetic complications  Airway Mallampati: Intubated       Dental   Unable to eval:   Pulmonary COPD, former smoker Remains intubated, ventilated, on nitric oxide s/p LVAD insertion yesterday Pt in process of weaning from vent, not moving L side   breath sounds clear to auscultation       Cardiovascular hypertension, + CAD and +CHF  + Cardiac Defibrillator (disbled presently)  Rhythm:Regular Rate:Normal  Cardiac sarcoid LVAD implantation yesterday: pump speed 3.4, flow 5200, PI 5.5 EF 18%, PAP 77 mmHg, grossly normal mitral valve and aortic valve On epi 5, milrinone , levo off   Neuro/Psych Discovered to not be moving L side while weaning from vent this am s/o VAD insertion CVA (prior R MCA stroke in June: thrombectomy), Residual Symptoms    GI/Hepatic ,GERD  ,,Elevated LFTs   Endo/Other  BMI 29  Renal/GU Renal InsufficiencyRenal disease     Musculoskeletal   Abdominal   Peds  Hematology  (+) Blood dyscrasia (Hb 9.2, plt 162k), anemia   Anesthesia Other Findings   Reproductive/Obstetrics                              Anesthesia Physical Anesthesia Plan  ASA: 4 and emergent  Anesthesia Plan: General   Post-op Pain Management: Minimal or no pain anticipated   Induction: Intravenous  PONV Risk Score and Plan: 2 and Treatment may vary due to age or medical condition  Airway Management Planned: Oral ETT  Additional Equipment: Arterial line and PA Cath  Intra-op Plan:   Post-operative Plan: Post-operative intubation/ventilation  Informed Consent:      Only emergency  history available  Plan Discussed with:   Anesthesia Plan Comments:          Anesthesia Quick Evaluation

## 2022-08-30 NOTE — Progress Notes (Signed)
   08/30/22 0313  INOMAX  $ INO Therapy Daily Yes  NO2 (ppm) 0.2 ppm  NO (ppm) 15 ppm  Oxygen (%) 98 %  Tank PSI 1800 PSI

## 2022-08-30 NOTE — Progress Notes (Signed)
VAD flow 3.6, CVP 13, MAP 78  iNO weaned from 25 to 20ppm  Flows now 3.5, CVP 12, MAP 79

## 2022-08-30 NOTE — Progress Notes (Signed)
Nitric wean on hold due to VAD flow < 3,5 liters/min. RN aware.

## 2022-08-30 NOTE — Sedation Documentation (Signed)
This RN took over primary IR nurse role at this time.

## 2022-08-30 NOTE — Progress Notes (Signed)
This chaplain is present for F/U spiritual care.   The Pt. is intubated and sedated at the time of the visit. The Pt. daughter-Kayla is at the bedside. Dorathy Daft acknowledged the space of watchful waiting and intercessory prayer with the chaplain.    This chaplain is available for F/U spiritual care as needed.  Chaplain Stephanie Acre [phone]

## 2022-08-30 NOTE — Progress Notes (Signed)
Flows slow trend down 4.2>>>3.6 over past five hours, along with CO/CI trending down as well (4/2.2.>>>3.5/1.8). Decided to hold weaning iNO for one hour to ensure flows don't continue dropping. Respiratory aware.   08/30/2022 12:00 AM

## 2022-08-30 NOTE — Progress Notes (Signed)
RT and RN transported pt drom 2H02 to CT and to IR. Pt remains stable on vent and INO.

## 2022-08-30 NOTE — Progress Notes (Signed)
Ventilator/NoxBox patient transported from IR to 2H02 without any complications.

## 2022-08-30 NOTE — Progress Notes (Signed)
Ventilator/NoxBox patient transported from 2H02 to CT3 and back without any complications.

## 2022-08-30 NOTE — Transfer of Care (Signed)
Immediate Anesthesia Transfer of Care Note  Patient: Charles Holmes  Procedure(s) Performed: IR WITH ANESTHESIA  Patient Location: ICU  Anesthesia Type:General  Level of Consciousness: sedated and unresponsive  Airway & Oxygen Therapy: Patient remains intubated per anesthesia plan and Patient placed on Ventilator (see vital sign flow sheet for setting)  Post-op Assessment: Report given to RN and Post -op Vital signs reviewed and stable  Post vital signs: Reviewed and stable  Last Vitals:  Vitals Value Taken Time  BP    Temp    Pulse 163 08/30/22 0811  Resp 14 08/30/22 0820  SpO2 82 % 08/30/22 0811  Vitals shown include unfiled device data.  Last Pain:  Vitals:   08/30/22 0200  TempSrc:   PainSc: Asleep      Patients Stated Pain Goal: 2 (08/10/22 2000)  Complications: No notable events documented.

## 2022-08-30 NOTE — Significant Event (Signed)
Rapid Response Event Note   Reason for Call : left sided weakness Initial Focused Assessment:  Notified by Farley Ly of pt with new left sided weakness. LSW midnight. Pt with new left sided weakness and neglect. CBG 107. Code stroke activated and pt transported to CT.   Interventions:  -Stat Head CT/CTA/CTP -Code IR activated and pt taken for mechanical thrombectomy.     MD Notified: Dr. Donata Clay per Fleet Contras RN Call Time: [phone] Arrival Time: 0446 End Time: 0705  Rose Fillers, RN

## 2022-08-30 NOTE — Progress Notes (Addendum)
LVAD Coordinator Rounding Note:  Admitted 08/10/22 due to acute on chronic CHF with cardiogenic shock. Milrinone dependent. Advance therapy workup completed, and pt deemed acceptable VAD candidate. Dental extractions completed 8/6. IABP placed 08/25/22.  HM 3 LVAD implanted on 08/29/22 by Dr Donata Clay under destination therapy criteria. 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 Result pending.  7/25 Admit with cardiogenic shock. Started milrinone and NE. 8/6 S/P 13 teeth extractions  8/9 IABP placed 8/13 S/p HM III LVAD implant + clipping left atrial appendage d/t severe thickening and invagination of mitral valve annulus impeding flows  CT Head 8/14 (initial) 1. 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 ASPECTS is 7. 2. No acute hemorrhage.  CT Angio Head/Neck 1. 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 2. Core infarct of 12 cc (somewhat underestimated compared to aspects) with 90 cc of penumbra. 3. Mild atherosclerosis.  VAD coordinator received page early this morning reporting pt with left side hemiparesis. Code Stroke was called. CT scans as above. Pt taken emergently to IR for percutaneous right common carotid arteriogram with thrombectomy. Revascularization achieved. Angio-seal closure device applied to left groin- clean, dry, and intact. Will repeat head CT this afternoon per neuro team.   Pt remains intubated and sedated. Daughter Dorathy Daft is at bedside. Provider team at bedside. Will keep intubated today.   LFTs elevated. Pt slightly jaundiced.   Speed increased to 5300 per Dr Gala Romney. Tolerating well.   Vital signs: Temp: 98.6 HR: AV paced 80 Doppler Pressure: 100 Arterial BP: 104/84 (95) O2 Sat: 100% on 100% FiO2 Wt: 183.6 lbs   LVAD interrogation reveals:  Speed: 5300 Flow: 3.4 Power: 3.6 w PI: 7.4   Alarms: none Events:  rare  Hematocrit: 20-DO NOT ADJUST Fixed speed: 5300 Low speed limit:  5000  Drive Line: Existing VAD dressing removed and site care performed using sterile technique. Drive line exit site cleaned with Chlora prep applicators x 2, allowed to dry, and silver strip with gauze applied. Exit site healing and unincorporated, the velour is fully implanted at exit site. Scant amount of serosanguinous/dried bloody drainage. No redness, tenderness, foul odor or rash noted. 1 suture in place under the driveline. Drive line anchor re-applied. Continue daily dressing changes. Next dressing change due Thursday 08/31/22 by VAD coordinator or nurse champion only.   Labs:  LDH trend: 596  INR trend: 1.4  AST/ALT trend: 218/45  Total Bili trend: 4.4  WBC trend: 11.6  Anticoagulation Plan: -INR Goal: 2.0 - 2.5 -ASA Dose: 325 mg   Blood Products:  IntraOp 8/14: - 4 FFP - 2 Platelets - 2 PRBC - 1 cyro - 449 cc of cellsaver - DDAVP 20 mcg x 1   Device: Medtronic BiV -  -Therapies: OFF  Arrythmias:   Respiratory: Intubated. Nitric 10 ppm.   Infection:   Renal:  -BUN/CRT: 11/0.84  Adverse Events on VAD: - 08/30/22: - Developed left sided weakness this am. CTA with R MCA infarct. Taken to IR for thrombectomy   Drips:  Epinephrine 5 mcg/min Milrinone 0.5 mcg/kg/min Precedex 1.2 mcg/kg/hr Levophed-- off Insulin-- off  Patient Education: Pt intubated and sedated Pt's daughter Dorathy Daft observed dressing change today  Plan/Recommendations:  Please page VAD coordinator for any alarms or VAD equipment issues. Daily dressing changes by VAD coordinator or Nurse Dierdre Forth RN VAD Coordinator  Office: [phone]  24/7 Pager: [phone]

## 2022-08-30 NOTE — Progress Notes (Signed)
EVENING ROUNDS NOTE :     [address]       [place] [address]             [phone]                 Day of Surgery Procedure(s) (LRB): IR WITH ANESTHESIA (N/A)   Total Length of Stay:  LOS: 20 days  Events:   No events Remains vented    BP 96/66   Pulse (!) 30   Temp 98.1 F (36.7 C)   Resp 17   Ht 5\' 7"  (1.702 m)   Wt 83.3 kg   SpO2 100%   BMI 28.76 kg/m   PAP: (20-46)/(12-28) 21/12 CVP:  [8 mmHg-28 mmHg] 12 mmHg CO:  [3.1 L/min-4.4 L/min] 3.3 L/min CI:  [1.7 L/min/m2-2.35 L/min/m2] 1.8 L/min/m2  Vent Mode: PRVC FiO2 (%):  [50 %-100 %] 50 % Set Rate:  [16 bmp] 16 bmp Vt Set:  [580 mL] 580 mL PEEP:  [5 cmH20] 5 cmH20 Plateau Pressure:  [17 cmH20-18 cmH20] 18 cmH20   sodium chloride Stopped (08/30/22 0516)   sodium chloride     sodium chloride     sodium chloride 10 mL/hr at 08/30/22 1500   sodium chloride     sodium chloride      ceFAZolin (ANCEF) IV Stopped (08/30/22 1424)   dexmedetomidine (PRECEDEX) IV infusion 0.5 mcg/kg/hr (08/30/22 1500)   epinephrine 3 mcg/min (08/30/22 1500)   insulin Stopped (08/30/22 9147)   lactated ringers     lactated ringers     lactated ringers 20 mL/hr at 08/30/22 1500   milrinone 0.5 mcg/kg/min (08/30/22 1500)   norepinephrine (LEVOPHED) Adult infusion Stopped (08/30/22 8295)   vancomycin Stopped (08/30/22 1232)    I/O last 3 completed shifts: In: 7513.7 [I.V.:3872.1; Blood:1499; NG/GT:110; IV Piggyback:2032.6] Out: 3534 [Urine:1935; Emesis/NG output:100; Blood:842; Chest Tube:657]      Latest Ref Rng & Units 08/30/2022   11:24 AM 08/30/2022    3:04 AM 08/30/2022    2:58 AM  CBC  WBC 4.0 - 10.5 K/uL   11.6   Hemoglobin 13.0 - 17.0 g/dL 8.8  9.2  9.0   Hematocrit 39.0 - 52.0 % 26.0  27.0  27.3   Platelets 150 - 400 K/uL   167        Latest Ref Rng & Units 08/30/2022   11:24 AM 08/30/2022    3:04 AM 08/30/2022    2:58 AM  BMP  Glucose 70 - 99 mg/dL   621   BUN 6 - 20 mg/dL   11    Creatinine 3.08 - 1.24 mg/dL   6.57   Sodium 846 - 962 mmol/L 135  135  133   Potassium 3.5 - 5.1 mmol/L 4.9  4.9  4.8   Chloride 98 - 111 mmol/L   103   CO2 22 - 32 mmol/L   23   Calcium 8.9 - 10.3 mg/dL   8.0     ABG    Component Value Date/Time   PHART 7.388 08/30/2022 1124   PCO2ART 39.7 08/30/2022 1124   PO2ART 133 (H) 08/30/2022 1124   HCO3 24.0 08/30/2022 1124   TCO2 25 08/30/2022 1124   ACIDBASEDEF 1.0 08/30/2022 1124   O2SAT 65.1 08/30/2022 1126        , MD 08/30/2022 4:07 PM

## 2022-08-30 NOTE — Consult Note (Addendum)
NEUROLOGY CONSULTATION NOTE   Date of service: August 30, 2022 Patient Name: Charles Holmes MRN:  [id number] DOB:  [birth year] Reason for consult: "L sided weakness, code stroke" Requesting Provider: Dolores Patty, MD _ _ _   _ __   _ __ _ _  __ __   _ __   __ _  History of Present Illness  Charles Holmes is a 57 y.o. 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 He was noted to be moving all extremities and lift hem up of the bed around midnight. As sedation was being weaned in anticipation of eventual extubation in AM, he was noted to not be able to move his LUE and LLE. A code stroke was activated.  He is intubated which limits exam. On exam, he has dysconjugate gaze, can lift his R arm up, give a thumbs up and lifts his RLE on command. No movement in LUE and LLE.  CT Head with no ICH but notable for developing R MCA stroke with ASPECTS of 7. CTA with R MCA M1 occlusion. CTP with a core infarct o 12cc and penumbra of 90cc.  LKW: 0000 on 08/30/22 mRS: 3 tNKASE: not offered, outside window and recent surgery Thrombectomy: Dr. Corliss Skains discussed details of surgery along with risks and benefits with patient's daughter over phone. Discussed risk of ICH and may require stenting which would impart additional risk of hemorrhage. Also discussed that CT shows some damage in the R MCA distribution so somewhat unclear how much he will recover. Daughter understands the risks and benefits and consented to thrombectomy. There was some delay in activating IR due to difficult transportation to scanner with vent and LVAD and hard stick requiring mulitple attempts before IV access could be obtained. NIHSS components Score: Comment  1a Level of Conscious 0[]  1[x]  2[]  3[]      1b LOC Questions 0[]  1[]  2[x]       1c LOC Commands 0[x]  1[]  2[]       2 Best  Gaze 0[x]  1[]  2[]       3 Visual 0[]  1[x]  2[]  3[]      4 Facial Palsy 0[x]  1[]  2[]  3[]      5a Motor Arm - left 0[]  1[]  2[]  3[]  4[x]  UN[]    5b Motor Arm - Right 0[x]  1[]  2[]  3[]  4[]  UN[]    6a Motor Leg - Left 0[]  1[]  2[]  3[]  4[x]  UN[]    6b Motor Leg - Right 0[x]  1[]  2[]  3[]  4[]  UN[]    7 Limb Ataxia 0[x]  1[]  2[]  3[]  UN[]     8 Sensory 0[]  1[]  2[x]  UN[]      9 Best Language 0[]  1[x]  2[]  3[]      10 Dysarthria 0[]  1[]  2[]  UN[x]      11 Extinct. and Inattention 0[]  1[]  2[x]       TOTAL: 17     ROS   Unable to obtain ROS due to intubation.  Past History   Past Medical History:  Diagnosis Date   CAD (coronary artery disease)    CHF (congestive heart failure) (HCC)    GERD (gastroesophageal reflux disease)    Hyperlipidemia    Hypertension    Systolic heart failure (HCC) 2021   LVEF 18%, RVEF 38% on cardiac MRI 12/19/2019. possible cardiac sarcoidosis.   Wide-complex tachycardia 2021   wears LifeVest   Past Surgical History:  Procedure Laterality Date  BIV UPGRADE N/A 06/07/2021   Procedure: BIV ICD UPGRADE;  Surgeon: Regan Lemming, MD;  Location: [facility] INVASIVE CV LAB;  Service: Cardiovascular;  Laterality: N/A;   IABP INSERTION N/A 08/25/2022   Procedure: IABP Insertion;  Surgeon: Dolores Patty, MD;  Location: [facility] INVASIVE CV LAB;  Service: Cardiovascular;  Laterality: N/A;   ICD IMPLANT N/A 02/20/2020   Procedure: ICD IMPLANT;  Surgeon: Regan Lemming, MD;  Location: [facility] INVASIVE CV LAB;  Service: Cardiovascular;  Laterality: N/A;   IR CT HEAD LTD  07/10/2022   IR PERCUTANEOUS ART THROMBECTOMY/INFUSION INTRACRANIAL INC DIAG ANGIO  07/10/2022   IR US GUIDE VASC ACCESS RIGHT  07/10/2022   RADIOLOGY WITH ANESTHESIA N/A 07/10/2022   Procedure: RADIOLOGY WITH ANESTHESIA;  Surgeon: Radiologist, Medication, MD;  Location: [facility] OR;  Service: Radiology;  Laterality: N/A;   RIGHT HEART CATH N/A 07/14/2022   Procedure: RIGHT HEART CATH;  Surgeon: Dolores Patty, MD;  Location: [facility]  INVASIVE CV LAB;  Service: Cardiovascular;  Laterality: N/A;   RIGHT HEART CATH N/A 08/25/2022   Procedure: RIGHT HEART CATH;  Surgeon: Dolores Patty, MD;  Location: [facility] INVASIVE CV LAB;  Service: Cardiovascular;  Laterality: N/A;   RIGHT/LEFT HEART CATH AND CORONARY ANGIOGRAPHY N/A 12/16/2019   Procedure: RIGHT/LEFT HEART CATH AND CORONARY ANGIOGRAPHY;  Surgeon: [place], Peter M, MD;  Location: [facility] INVASIVE CV LAB;  Service: Cardiovascular;  Laterality: N/A;   RIGHT/LEFT HEART CATH AND CORONARY ANGIOGRAPHY N/A 08/10/2022   Procedure: RIGHT/LEFT HEART CATH AND CORONARY ANGIOGRAPHY;  Surgeon: Dolores Patty, MD;  Location: [facility] INVASIVE CV LAB;  Service: Cardiovascular;  Laterality: N/A;   TOOTH EXTRACTION N/A 08/22/2022   Procedure: DENTAL RESTORATION/EXTRACTIONS;  Surgeon: Ocie Doyne, DMD;  Location: [facility] OR;  Service: Oral Surgery;  Laterality: N/A;   Family History  Problem Relation Age of Onset   Hypertension Father    Heart disease Father    Heart disease Mother    Social History   Socioeconomic History   Marital status: Divorced    Spouse name: Not on file   Number of children: Not on file   Years of education: Not on file   Highest education level: Not on file  Occupational History   Not on file  Tobacco Use   Smoking status: Former    Types: Cigarettes   Smokeless tobacco: Never  Vaping Use   Vaping status: Unknown  Substance and Sexual Activity   Alcohol use: Yes    Alcohol/week: 6.0 standard drinks of alcohol    Types: 6 Cans of beer per week   Drug use: Yes    Types: Marijuana    Comment: stopped using months ago d/t how it affected breathing   Sexual activity: Yes  Other Topics Concern   Not on file  Social History Narrative   Not on file   Social Determinants of Health   Financial Resource Strain: Low Risk  (07/12/2022)   Overall Financial Resource Strain (CARDIA)    Difficulty of Paying Living Expenses: Not hard at all  Food Insecurity: No Food  Insecurity (08/13/2022)   Hunger Vital Sign    Worried About Running Out of Food in the Last Year: Never true    Ran Out of Food in the Last Year: Never true  Transportation Needs: No Transportation Needs (08/13/2022)   PRAPARE - Administrator, Civil Service (Medical): No    Lack of Transportation (Non-Medical): No  Physical Activity: Not on file  Stress:  Not on file  Social Connections: Not on file   Allergies  Allergen Reactions   Amiodarone     Severe tremors   Percocet [Oxycodone-Acetaminophen] Itching    Medications   Medications Prior to Admission  Medication Sig Dispense Refill Last Dose   apixaban (ELIQUIS) 5 MG TABS tablet Take 1 tablet (5 mg total) by mouth 2 (two) times daily. 180 tablet 3 08/10/2022 at 0730   atorvastatin (LIPITOR) 80 MG tablet Take 1 tablet (80 mg total) by mouth daily. 90 tablet 3 08/10/2022   carvedilol (COREG) 3.125 MG tablet Take 1 tablet (3.125 mg total) by mouth 2 (two) times daily. 180 tablet 3 08/10/2022 at 0730   dapagliflozin propanediol (FARXIGA) 10 MG TABS tablet Take 1 tablet (10 mg total) by mouth daily before breakfast. 90 tablet 3 08/10/2022   dorzolamide-timolol (COSOPT) 22.3-6.8 MG/ML ophthalmic solution Place 1 drop into both eyes in the morning and at bedtime.   unknown   fluticasone-salmeterol (ADVAIR HFA) 230-21 MCG/ACT inhaler Inhale 2 puffs into the lungs 2 (two) times daily. 1 each 12 08/10/2022   folic acid (FOLVITE) 1 MG tablet Take 1 tablet (1 mg total) by mouth daily. 90 tablet 3 08/10/2022   furosemide (LASIX) 40 MG tablet Take 1 tablet (40 mg total) by mouth 2 (two) times daily. 90 tablet 3 08/10/2022   latanoprost (XALATAN) 0.005 % ophthalmic solution Place 1 drop into both eyes at bedtime.   unknown   methotrexate (RHEUMATREX) 2.5 MG tablet Take 8 tablets (20 mg total) by mouth once a week. (Patient taking differently: Take 20 mg by mouth once a week. Saturdays) 96 tablet 3 08/05/2022   mexiletine (MEXITIL) 250 MG  capsule Take 1 capsule (250 mg total) by mouth 2 (two) times daily. 180 capsule 3 08/10/2022   pantoprazole (PROTONIX) 40 MG tablet Take 40 mg by mouth daily before breakfast.   08/10/2022   spironolactone (ALDACTONE) 25 MG tablet TAKE 1/2 TABLET BY MOUTH DAILY (Patient taking differently: Take 12.5 mg by mouth at bedtime.) 45 tablet 0 08/09/2022     Vitals   Vitals:   08/30/22 0345 08/30/22 0400 08/30/22 0411 08/30/22 0415  BP:  96/66    Pulse: (!) 190 (!) 183 (!) 180 (!) 175  Resp:  20    Temp: 99.5 F (37.5 C) 99.5 F (37.5 C) 99.3 F (37.4 C) 99.5 F (37.5 C)  TempSrc:      SpO2: 98% 99% 99% 98%  Weight:      Height:         Body mass index is 26.52 kg/m.  Physical Exam   General: Laying comfortably in bed; in no acute distress.  HENT: Normal oropharynx and mucosa. Normal external appearance of ears and nose.  Neck: Supple, no pain or tenderness  CV: No JVD. No peripheral edema.  Pulmonary: Symmetric Chest rise. Breathing over vent. Abdomen: Soft to touch, non-tender.  Ext: No cyanosis, BL lower ext edema, no deformity  Skin: No rash. Normal palpation of skin.   Musculoskeletal: Normal digits and nails by inspection. No clubbing.   Neurologic Examination  Mental status/Cognition: Alert, eyes closed. Unable to answer orientation questions secondary to intubation. Speech/language: limited due to intubation. Follows commands in RUE and RLE. Cranial nerves:   CN II Pupils equal and reactive to light, unable to assess for VF deficit.   CN III,IV,VI Dysconjugate gaze.   CN V Unable to assess.   CN VII Unable to assess secondary to hardware to secure ETT.  CN VIII normal hearing to speech   CN IX & X normal palatal elevation, no uvular deviation   CN XI Unable to assess   CN XII Tongue pushed to side by ETT.   Motor:  Muscle bulk: normal, tone flaccid in LUE and LLE Hold RUE off the bed for more than 10 secs without any drift. Holds RLE off the bed for more than 5  secs without any drift. No movement in LUE and LLE.  Sensation:  Light touch    Pin prick No response to proximal pinch in LUE and LLE.   Temperature    Vibration   Proprioception    Coordination/Complex Motor:  - Finger to Nose intact in RUE  Gait: - Deferred for patient safety.  Labs   CBC:  Recent Labs  Lab 08/29/22 2215 08/30/22 0258 08/30/22 0304  WBC 11.0* 11.6*  --   NEUTROABS  --  9.9*  --   HGB 9.5*  9.0* 9.0* 9.2*  HCT 28.0*  28.2* 27.3* 27.0*  MCV 99.6 96.8  --   PLT 163 167  --     Basic Metabolic Panel:  Lab Results  Component Value Date   NA 135 08/30/2022   K 4.9 08/30/2022   CO2 23 08/30/2022   GLUCOSE 118 (H) 08/30/2022   BUN 11 08/30/2022   CREATININE 0.84 08/30/2022   CALCIUM 8.0 (L) 08/30/2022   GFRNONAA >60 08/30/2022   Lipid Panel:  Lab Results  Component Value Date   LDLCALC 51 08/11/2022   HgbA1c:  Lab Results  Component Value Date   HGBA1C 5.5 08/11/2022   Urine Drug Screen:     Component Value Date/Time   LABOPIA NONE DETECTED 08/11/2022 1328   COCAINSCRNUR NONE DETECTED 08/11/2022 1328   LABBENZ NONE DETECTED 08/11/2022 1328   AMPHETMU NONE DETECTED 08/11/2022 1328   THCU NONE DETECTED 08/11/2022 1328   LABBARB NONE DETECTED 08/11/2022 1328    Alcohol Level No results found for: "ETH"  CT Head without contrast(Personally reviewed): Developing R MCA stroke with ASPECTS of 7. No ICH.  CT angio Head and Neck with contrast(Personally reviewed): R MCA M1 occlusion.  MRI Brain(Personally reviewed): Pending  Impression   Charles Holmes is a 57 y.o. male admitted to CVICU after LVAD and post operatively, noted to have LUE and LLE as sedation was weaned off. Found to have R MCA M1 occlusion with a core infarct of 12cc and penumbra of 90cc.  He was not a candidate for tnkase due to being outside the window. Case was discussed with his daughter who consented for thrombectomy. He was emergently taken for  thrombectomy.  Recommendations  Acute R MCA stroke with R MCA M1 occlusion undergoing thrombectomy: - Frequent Neuro checks per stroke unit protocol - will need repeat CT Head in 24 hours as he will unlikely be able to get MRI brain due to LVAD. - Recommend obtaining Lipid panel with LDL - Please start statin if LDL > 70 - Recommend HbA1c to evaluate for diabetes and how well it is controlled. - Antithrombotic - per Neuro IR for the first 24 hours after intervention. - Recommend DVT ppx - SBP goal - per Neuro IR for the first 24 hours after intervention. - Recommend Telemetry monitoring for arrythmia - Recommend bedside swallow screen prior to PO intake. - Stroke education booklet - Recommend PT/OT/SLP consult - stroke team to follow. Please reach out to stroke team with any questions or concerns.  This patient is critically  ill and at significant risk of neurological worsening, death and care requires constant monitoring of vital signs, hemodynamics,respiratory and cardiac monitoring, neurological assessment, discussion with family, other specialists and medical decision making of high complexity. I spent 100 minutes of neurocritical care time  in the care of  this patient. This was time spent independent of any time provided by nurse practitioner or PA.  Erick Blinks [facility] 08/30/2022  6:57 AM  ______________________________________________________________________  Dr. Gala Romney is aware that patient is going to IR for thrombectomy.  Thank you for the opportunity to take part in the care of this patient. If you have any further questions, please contact the neurology consultation attending.  Signed,  Erick Blinks [facility] _ _ _   _ __   _ __ _ _  __ __   _ __   __ _

## 2022-08-30 NOTE — Plan of Care (Signed)

## 2022-08-30 NOTE — Progress Notes (Signed)
LVAD flow 3.8, CVP 12, MAP 82  iNO decreased from 15 to 10ppm  Flows now 3.8, CVP 15, MAP 80.

## 2022-08-31 ENCOUNTER — Inpatient Hospital Stay (HOSPITAL_COMMUNITY): Payer: Medicare Other

## 2022-08-31 DIAGNOSIS — Z95811 Presence of heart assist device: Secondary | ICD-10-CM | POA: Diagnosis not present

## 2022-08-31 DIAGNOSIS — I509 Heart failure, unspecified: Secondary | ICD-10-CM | POA: Diagnosis not present

## 2022-08-31 DIAGNOSIS — I428 Other cardiomyopathies: Secondary | ICD-10-CM | POA: Diagnosis not present

## 2022-08-31 DIAGNOSIS — I5023 Acute on chronic systolic (congestive) heart failure: Secondary | ICD-10-CM | POA: Diagnosis not present

## 2022-08-31 LAB — POCT I-STAT 7, (LYTES, BLD GAS, ICA,H+H)
Acid-base deficit: 2 mmol/L (ref 0.0–2.0)
Acid-base deficit: 4 mmol/L — ABNORMAL HIGH (ref 0.0–2.0)
Acid-base deficit: 4 mmol/L — ABNORMAL HIGH (ref 0.0–2.0)
Bicarbonate: 22.9 mmol/L (ref 20.0–28.0)
Bicarbonate: 21.2 mmol/L (ref 20.0–28.0)
Bicarbonate: 20.5 mmol/L (ref 20.0–28.0)
Calcium, Ion: 1.16 mmol/L (ref 1.15–1.40)
Calcium, Ion: 1.18 mmol/L (ref 1.15–1.40)
Calcium, Ion: 1.2 mmol/L (ref 1.15–1.40)
HCT: 24 % — ABNORMAL LOW (ref 39.0–52.0)
HCT: 28 % — ABNORMAL LOW (ref 39.0–52.0)
HCT: 28 % — ABNORMAL LOW (ref 39.0–52.0)
Hemoglobin: 8.2 g/dL — ABNORMAL LOW (ref 13.0–17.0)
Hemoglobin: 9.5 g/dL — ABNORMAL LOW (ref 13.0–17.0)
Hemoglobin: 9.5 g/dL — ABNORMAL LOW (ref 13.0–17.0)
O2 Saturation: 99 %
O2 Saturation: 99 %
O2 Saturation: 98 %
Patient temperature: 36.8
Patient temperature: 36.9
Patient temperature: 36.9
Potassium: 4.1 mmol/L (ref 3.5–5.1)
Potassium: 4.2 mmol/L (ref 3.5–5.1)
Potassium: 4.2 mmol/L (ref 3.5–5.1)
Sodium: 135 mmol/L (ref 135–145)
Sodium: 135 mmol/L (ref 135–145)
Sodium: 134 mmol/L — ABNORMAL LOW (ref 135–145)
TCO2: 24 mmol/L (ref 22–32)
TCO2: 22 mmol/L (ref 22–32)
TCO2: 22 mmol/L (ref 22–32)
pCO2 arterial: 37.8 mmHg (ref 32–48)
pCO2 arterial: 35.8 mmHg (ref 32–48)
pCO2 arterial: 35.8 mmHg (ref 32–48)
pH, Arterial: 7.388 (ref 7.35–7.45)
pH, Arterial: 7.38 (ref 7.35–7.45)
pH, Arterial: 7.365 (ref 7.35–7.45)
pO2, Arterial: 159 mmHg — ABNORMAL HIGH (ref 83–108)
pO2, Arterial: 133 mmHg — ABNORMAL HIGH (ref 83–108)
pO2, Arterial: 113 mmHg — ABNORMAL HIGH (ref 83–108)

## 2022-08-31 LAB — COMPREHENSIVE METABOLIC PANEL
ALT: 30 U/L (ref 0–44)
AST: 96 U/L — ABNORMAL HIGH (ref 15–41)
Albumin: 2.7 g/dL — ABNORMAL LOW (ref 3.5–5.0)
Alkaline Phosphatase: 41 U/L (ref 38–126)
Anion gap: 6 (ref 5–15)
BUN: 9 mg/dL (ref 6–20)
CO2: 23 mmol/L (ref 22–32)
Calcium: 8 mg/dL — ABNORMAL LOW (ref 8.9–10.3)
Chloride: 103 mmol/L (ref 98–111)
Creatinine, Ser: 0.88 mg/dL (ref 0.61–1.24)
GFR, Estimated: >60 mL/min (ref >60)
Glucose, Bld: 119 mg/dL — ABNORMAL HIGH (ref 70–99)
Potassium: 4 mmol/L (ref 3.5–5.1)
Sodium: 132 mmol/L — ABNORMAL LOW (ref 135–145)
Total Bilirubin: 4.6 mg/dL — ABNORMAL HIGH (ref 0.3–1.2)
Total Protein: 4.9 g/dL — ABNORMAL LOW (ref 6.5–8.1)

## 2022-08-31 LAB — LACTATE DEHYDROGENASE: LDH: 441 U/L — ABNORMAL HIGH (ref 98–192)

## 2022-08-31 LAB — COOXEMETRY PANEL
Carboxyhemoglobin: 2 % — ABNORMAL HIGH (ref 0.5–1.5)
Carboxyhemoglobin: 1.6 % — ABNORMAL HIGH (ref 0.5–1.5)
Methemoglobin: <0.7 % (ref 0.0–1.5)
Methemoglobin: 0.7 % (ref 0.0–1.5)
O2 Saturation: 66.5 %
O2 Saturation: 63.3 %
Total hemoglobin: 8.4 g/dL — ABNORMAL LOW (ref 12.0–16.0)
Total hemoglobin: 9.8 g/dL — ABNORMAL LOW (ref 12.0–16.0)

## 2022-08-31 LAB — CBC WITH DIFFERENTIAL/PLATELET
Abs Immature Granulocytes: 0.09 10*3/uL — ABNORMAL HIGH (ref 0.00–0.07)
Basophils Absolute: 0 10*3/uL (ref 0.0–0.1)
Basophils Relative: 0 %
Eosinophils Absolute: 0 10*3/uL (ref 0.0–0.5)
Eosinophils Relative: 0 %
HCT: 24.8 % — ABNORMAL LOW (ref 39.0–52.0)
Hemoglobin: 7.9 g/dL — ABNORMAL LOW (ref 13.0–17.0)
Immature Granulocytes: 1 %
Lymphocytes Relative: 2 %
Lymphs Abs: 0.3 10*3/uL — ABNORMAL LOW (ref 0.7–4.0)
MCH: 31.9 pg (ref 26.0–34.0)
MCHC: 31.9 g/dL (ref 30.0–36.0)
MCV: 100 fL (ref 80.0–100.0)
Monocytes Absolute: 2.2 10*3/uL — ABNORMAL HIGH (ref 0.1–1.0)
Monocytes Relative: 16 %
Neutro Abs: 11 10*3/uL — ABNORMAL HIGH (ref 1.7–7.7)
Neutrophils Relative %: 81 %
Platelets: 139 10*3/uL — ABNORMAL LOW (ref 150–400)
RBC: 2.48 MIL/uL — ABNORMAL LOW (ref 4.22–5.81)
RDW: 17.5 % — ABNORMAL HIGH (ref 11.5–15.5)
WBC: 13.7 10*3/uL — ABNORMAL HIGH (ref 4.0–10.5)
nRBC: 0 % (ref 0.0–0.2)

## 2022-08-31 LAB — TYPE AND SCREEN
ABO/RH(D): O POS
Antibody Screen: NEGATIVE

## 2022-08-31 LAB — PREPARE RBC (CROSSMATCH)

## 2022-08-31 LAB — GLUCOSE, CAPILLARY
Glucose-Capillary: 104 mg/dL — ABNORMAL HIGH (ref 70–99)
Glucose-Capillary: 108 mg/dL — ABNORMAL HIGH (ref 70–99)
Glucose-Capillary: 113 mg/dL — ABNORMAL HIGH (ref 70–99)
Glucose-Capillary: 119 mg/dL — ABNORMAL HIGH (ref 70–99)
Glucose-Capillary: 129 mg/dL — ABNORMAL HIGH (ref 70–99)
Glucose-Capillary: 135 mg/dL — ABNORMAL HIGH (ref 70–99)
Glucose-Capillary: 164 mg/dL — ABNORMAL HIGH (ref 70–99)
Glucose-Capillary: 53 mg/dL — ABNORMAL LOW (ref 70–99)

## 2022-08-31 LAB — APTT: aPTT: 35 s (ref 24–36)

## 2022-08-31 LAB — MAGNESIUM: Magnesium: 2.2 mg/dL (ref 1.7–2.4)

## 2022-08-31 LAB — PROTIME-INR
INR: 1.6 — ABNORMAL HIGH (ref 0.8–1.2)
Prothrombin Time: 19 s — ABNORMAL HIGH (ref 11.4–15.2)

## 2022-08-31 LAB — PHOSPHORUS: Phosphorus: 2.8 mg/dL (ref 2.5–4.6)

## 2022-08-31 MED ORDER — MIDAZOLAM HCL 2 MG/2ML IJ SOLN: 1.0000 mg | INTRAMUSCULAR | Status: DC | PRN

## 2022-08-31 MED ORDER — MORPHINE SULFATE (PF) 2 MG/ML IV SOLN
2.0000 mg | INTRAVENOUS | Status: DC | PRN
  Administered 2022-08-31 – 2022-09-01 (×4): 2 mg via INTRAVENOUS
  Filled 2022-08-31 (×5): qty 1

## 2022-08-31 MED ORDER — MORPHINE SULFATE (PF) 2 MG/ML IV SOLN
2.0000 mg | INTRAVENOUS | Status: DC | PRN
  Administered 2022-08-31: 2 mg via INTRAVENOUS

## 2022-08-31 MED ORDER — SODIUM CHLORIDE 0.9% IV SOLUTION: Freq: Once | INTRAVENOUS | Status: DC

## 2022-08-31 MED ORDER — SILDENAFIL CITRATE 20 MG PO TABS
20.0000 mg | ORAL_TABLET | Freq: Three times a day (TID) | ORAL | Status: DC
  Administered 2022-08-31 – 2022-09-01 (×3): 20 mg
  Filled 2022-08-31 (×7): qty 1

## 2022-08-31 NOTE — Evaluation (Signed)
Physical Therapy Evaluation Patient Details Name: Charles Holmes MRN: [id number] DOB: 03/25/1965 Today's Date: 08/31/2022  History of Present Illness  Pt is 57 year old presented to [facility] on  08/10/22 for acute on chronic systolic heart failure. Pt had IABP placed on 08/25/22 for ongoing cardiogenic shock. Underwent placement of LVAD on 8/13. Developed lt sided weakness on 8/14. Rt MCA CVA. Underwent mechanical thrombectomy by IR. Extubated 8/15.  PMH - CAD, CHF, HLD, HTN, systolic HF, ICD implant, rt CVA.  LVAD placement and new stroke R MCA M1.  Clinical Impression  Pt now s/p LVAD placement on 8/13 and had rt MCA CVA post op resulting in lt sided weakness. Pt getting return in lt side and expect he will make steady progress. Today limited to bed level due to he was just extubated. Patient will benefit from intensive inpatient follow up therapy, >3 hours/day         If plan is discharge home, recommend the following:     Can travel by private vehicle        Equipment Recommendations Other (comment) (To be determined)  Recommendations for Other Services       Functional Status Assessment Patient has had a recent decline in their functional status and demonstrates the ability to make significant improvements in function in a reasonable and predictable amount of time.     Precautions / Restrictions Precautions Precautions: Sternal;Fall;Other (comment) Precaution Comments: LVAD, Swan, chest tubes, multiple lines.  Extubated 8/15. Restrictions Weight Bearing Restrictions: Yes Other Position/Activity Restrictions: Sternal Precautions      Mobility  Bed Mobility               General bed mobility comments: Deferred due to just extubated    Transfers                        Ambulation/Gait                  Stairs            Wheelchair Mobility     Tilt Bed    Modified Rankin (Stroke Patients Only)       Balance                                              Pertinent Vitals/Pain Pain Assessment Pain Assessment: Faces Faces Pain Scale: Hurts even more Pain Location: Sternal incision Pain Descriptors / Indicators: Tender Pain Intervention(s): Monitored during session    Home Living Family/patient expects to be discharged to:: Private residence Living Arrangements: Alone Available Help at Discharge: Available PRN/intermittently Type of Home: Mobile home Home Access: Stairs to enter Entrance Stairs-Rails: Doctor, general practice of Steps: 2   Home Layout: One level Home Equipment: None      Prior Function Prior Level of Function : Independent/Modified Independent;Driving               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  Minimal wrist flex/ext and supination.  Trace to bicep flex, and trance to shoulder complex. LUE Sensation: decreased light touch  LUE Coordination: decreased fine motor;decreased gross motor    Lower Extremity Assessment Lower Extremity Assessment: LLE deficits/detail;Generalized weakness LLE Deficits / Details: grossly 3-/5       Communication   Communication Communication: No apparent difficulties Cueing Techniques: Verbal cues  Cognition Arousal: Alert Behavior During Therapy: WFL for tasks assessed/performed Overall Cognitive Status: Within Functional Limits for tasks assessed                                          General Comments      Exercises General Exercises - Lower Extremity Ankle Circles/Pumps: AROM, Both, 10 reps, Supine Quad Sets: AROM, Both, 10 reps, Supine Short Arc Quad: AROM, 10 reps, Right, 5 reps, Left, Supine Heel Slides:  AAROM, Both, 10 reps, Supine Hip ABduction/ADduction: AAROM, Left, 5 reps, Supine   Assessment/Plan    PT Assessment Patient needs continued PT services  PT Problem List Decreased strength;Decreased activity tolerance;Decreased balance;Decreased mobility;Decreased knowledge of precautions;Decreased knowledge of use of DME;Pain       PT Treatment Interventions DME instruction;Gait training;Functional mobility training;Stair training;Therapeutic activities;Neuromuscular re-education;Patient/family education;Balance training;Therapeutic exercise    PT Goals (Current goals can be found in the Care Plan section)  Acute Rehab PT Goals Patient Stated Goal: return home PT Goal Formulation: With patient Time For Goal Achievement: 09/14/22 Potential to Achieve Goals: Fair    Frequency Min 1X/week     Co-evaluation               AM-PAC PT "6 Clicks" Mobility  Outcome Measure Help needed turning from your back to your side while in a flat bed without using bedrails?: Total Help needed moving from lying on your back to sitting on the side of a flat bed without using bedrails?: Total Help needed moving to and from a bed to a chair (including a wheelchair)?: Total Help needed standing up from a chair using your arms (e.g., wheelchair or bedside chair)?: Total Help needed to walk in hospital room?: Total Help needed climbing 3-5 steps with a railing? : Total 6 Click Score: 6    End of Session Equipment Utilized During Treatment: Oxygen Activity Tolerance: Patient limited by fatigue;Treatment limited secondary to medical complications (Comment) (just extubated) Patient left: in bed;with call bell/phone within reach Nurse Communication: Mobility status PT Visit Diagnosis: Other abnormalities of gait and mobility (R26.89);Difficulty in walking, not elsewhere classified (R26.2);Hemiplegia and hemiparesis Hemiplegia - Right/Left: Left Hemiplegia - dominant/non-dominant:  Non-dominant Hemiplegia - caused by: Cerebral infarction    Time: 1450-1500 PT Time Calculation (min) (ACUTE ONLY): 10 min   Charges:   PT Evaluation $PT Eval High Complexity: 1 High             [facility] PT Acute Rehabilitation Services Office [phone]   [name] [facility] 08/31/2022, 3:22 PM

## 2022-08-31 NOTE — Progress Notes (Addendum)
Advanced Heart Failure Rounding Note  PCP-Cardiologist: Norman Herrlich, MD   Subjective:    7/25: Admit with cardiogenic shock. Started milrinone and NE. 8/6 S/P 13 teeth extractions  8/9 IABP placed 8/13 S/p HM III LVAD implant + clipping left atrial appendage d/t severe thickening and invagination of mitral valve annulus impeding flows 8/14 Left-sided hemiplegia. CT head with acute R MCA infarct. IR for thrombectomy. CT head with small to moderate area of hemorrhagic conversion   Post Op Day #2  Remains on 0.5 milrinone + 5 Epi.   SWAN #s CO 3.98 CI 2.14 PA 25/16 (20) CVP 8 PAPi 1  Difficulty weaning off iNO with drop in flows and MAP.  Awake on vent and following commands. Able to move left lower extremity, left upper extremity with minimal movement    LVAD Interrogation HM III: Speed: 5300 Flow: 4 PI: 2.2 Power: 3.6.    Objective:   Weight Range: 86.7 kg Body mass index is 29.94 kg/m.   Vital Signs:   Temp:  [97.5 F (36.4 C)-98.6 F (37 C)] 98.2 F (36.8 C) (08/15 0715) Pulse Rate:  [29-197] 30 (08/15 0715) Resp:  [13-24] 15 (08/15 0715) BP: (120)/(102) 120/102 (08/15 0415) SpO2:  [95 %-100 %] 100 % (08/15 0715) Arterial Line BP: (74-112)/(57-104) 76/59 (08/15 0715) FiO2 (%):  [50 %-100 %] 50 % (08/15 0258) Weight:  [86.7 kg] 86.7 kg (08/15 0500) Last BM Date : 08/28/22  Weight change: Filed Weights   08/29/22 0500 08/30/22 0500 08/31/22 0500  Weight: 76.8 kg 83.3 kg 86.7 kg    Intake/Output:   Intake/Output Summary (Last 24 hours) at 08/31/2022 0725 Last data filed at 08/31/2022 0700 Gross per 24 hour  Intake 3839.28 ml  Output 1489 ml  Net 2350.28 ml   Physical Exam   Physical Exam: GENERAL: Awake on vent HEENT: normal  NECK: Supple, R internal jugular SWAN.   CARDIAC:  Mechanical heart sounds with LVAD hum present.  LUNGS:  Clear to auscultation bilaterally.  ABDOMEN:  Soft, round, nontender, positive bowel sounds x4.     LVAD  exit site: Dressing dry and intact.  Stabilization device present and accurately applied.   EXTREMITIES:  Warm and dry, no cyanosis, clubbing, rash or edema  NEUROLOGIC:  Following commands. Able to move LLE, minimal movement LUE   Telemetry     V paced 90s-100s     Labs    CBC Recent Labs    08/30/22 0258 08/30/22 0304 08/30/22 1657 08/30/22 1707 08/31/22 0310 08/31/22 0329  WBC 11.6*  --  12.1*  --  13.7*  --   NEUTROABS 9.9*  --   --   --  11.0*  --   HGB 9.0*   < > 8.4*   < > 7.9* 8.2*  HCT 27.3*   < > 25.9*   < > 24.8* 24.0*  MCV 96.8  --  100.0  --  100.0  --   PLT 167  --  144*  --  139*  --    < > = values in this interval not displayed.    Basic Metabolic Panel Recent Labs    13/08/65 0258 08/30/22 0304 08/30/22 1657 08/30/22 1707 08/31/22 0310 08/31/22 0329  NA 133*   < > 135   < > 132* 135  K 4.8   < > 4.4   < > 4.0 4.1  CL 103  --  103  --  103  --   CO2 23  --  22  --  23  --   GLUCOSE 118*  --  128*  --  119*  --   BUN 11  --  9  --  9  --   CREATININE 0.84  --  0.85  --  0.88  --   CALCIUM 8.0*  --  8.0*  --  8.0*  --   MG 2.3  --  2.2  --  2.2  --   PHOS 3.1  --   --   --  2.8  --    < > = values in this interval not displayed.    BNP: BNP (last 3 results) Recent Labs    07/26/22 1211 08/10/22 1142 08/30/22 0258  BNP 1,723.6* 1,905.0* 922.3*     Imaging    CT HEAD WO CONTRAST ( )  Result Date: 08/31/2022 CLINICAL DATA:  Stroke follow-up EXAM: CT HEAD WITHOUT CONTRAST TECHNIQUE: Contiguous axial images were obtained from the base of the skull through the vertex without intravenous contrast. RADIATION DOSE REDUCTION: This exam was performed according to the departmental dose-optimization program which includes automated exposure control, adjustment of the mA and/or kV according to patient size and/or use of iterative reconstruction technique. COMPARISON:  Yesterday FINDINGS: Brain: Hematoma at the right caudate head with mild  intraventricular extension is unchanged. Hazy high density at the right putamen show some fading, residual attributed to petechial blood. The extent of acute infarct in the right temporal cortex and right basal ganglia appears non progressed. No hydrocephalus. Vascular: No hyperdense vessel or unexpected calcification. Skull: Normal. Negative for fracture or focal lesion. Sinuses/Orbits: No acute finding. IMPRESSION: Unchanged extent of infarct and hemorrhage in the right MCA distribution including small volume intraventricular clot. No hydrocephalus. Electronically Signed   By: Tiburcio Pea M.D.   On: 08/31/2022 06:02   CT HEAD WO CONTRAST ( )  Result Date: 08/30/2022 CLINICAL DATA:  Stroke, follow up left sided weakness. EXAM: CT HEAD WITHOUT CONTRAST TECHNIQUE: Contiguous axial images were obtained from the base of the skull through the vertex without intravenous contrast. RADIATION DOSE REDUCTION: This exam was performed according to the departmental dose-optimization program which includes automated exposure control, adjustment of the mA and/or kV according to patient size and/or use of iterative reconstruction technique. COMPARISON:  Head CT 08/30/2022. FINDINGS: Brain: Hemorrhagic transformation of evolving infarct in the right caudate nucleus with small volume intraventricular extension of hemorrhage. No acute hydrocephalus. New hyperdensity within the lentiform nucleus may reflect petechial hemorrhage versus contrast staining. Surrounding hypoattenuation in the right internal capsule and corona radiata is favored to reflect evolving infarct. Unchanged old infarct in the right anterior temporal lobe. No extra-axial collection, significant mass effect or midline shift. Vascular: No hyperdense vessel or unexpected calcification. Skull: No calvarial fracture or suspicious bone lesion. Skull base is unremarkable. Sinuses/Orbits: No acute findings. Other: None. IMPRESSION: 1. Hemorrhagic transformation  of evolving infarct in the right caudate nucleus with small volume intraventricular extension of hemorrhage. No acute hydrocephalus. 2. New hyperdensity within the right lentiform nucleus may reflect petechial hemorrhage versus contrast staining. 3. Surrounding hypoattenuation in the right internal capsule and corona radiata is favored to reflect evolving infarct. Critical Value/emergent results were relayed via secure text page at the time of interpretation on 08/30/2022 at 4:32 pm to provider Dr. Selina Cooley, who acknowledged these results. Electronically Signed   By: Orvan Falconer M.D.   On: 08/30/2022 16:42   DG Chest Port 1 View  Result Date: 08/30/2022 CLINICAL DATA:  Left ventricular  assist device.  Follow-. EXAM: PORTABLE CHEST 1 VIEW COMPARISON:  Radiographs 08/29/2022 and 08/28/2022.  CT 08/11/2022. FINDINGS: 0924 hours. No significant change in position of the endotracheal tube, enteric tube, right IJ Swan-Ganz catheter and bilateral chest tubes. Left subclavian AICD leads are unchanged. The left ventricular assist device appears unchanged in position. The heart size and mediastinal contours are stable with mild cardiomegaly. Stable mild atelectasis at both lung bases. No evidence of pneumothorax, significant pleural effusion or acute osseous abnormality. IMPRESSION: Stable postoperative chest with unchanged support system and mild bibasilar atelectasis. No pneumothorax or significant pleural effusion. Electronically Signed   By: Carey Bullocks M.D.   On: 08/30/2022 13:27     Medications:     Scheduled Medications:  acetaminophen  1,000 mg Oral Q6H   Or   acetaminophen (TYLENOL) oral liquid 160 mg/5 mL  1,000 mg Per Tube Q6H   aspirin EC  325 mg Oral Daily   Or   aspirin  324 mg Per Tube Daily   Or   aspirin  300 mg Rectal Daily   atorvastatin  80 mg Per Tube Daily   bisacodyl  10 mg Oral Daily   Or   bisacodyl  10 mg Rectal Daily   chlorhexidine  15 mL Mouth/Throat TID   Chlorhexidine  Gluconate Cloth  6 each Topical Q0600   docusate  200 mg Per Tube Daily   dorzolamide-timolol  1 drop Both Eyes BID   insulin aspart  0-24 Units Subcutaneous Q4H   latanoprost  1 drop Both Eyes QHS   metoCLOPramide (REGLAN) injection  10 mg Intravenous Q6H   mexiletine  250 mg Per Tube BID   mometasone-formoterol  2 puff Inhalation BID   multivitamin with minerals  1 tablet Per Tube Daily   mouth rinse  15 mL Mouth Rinse Q2H   pantoprazole (PROTONIX) IV  40 mg Intravenous QHS   sodium chloride flush  3 mL Intravenous Q12H   sodium chloride flush  3 mL Intravenous Q12H   umeclidinium bromide  1 puff Inhalation Daily    Infusions:  sodium chloride Stopped (08/30/22 0516)   sodium chloride 10 mL/hr at 08/31/22 0700   sodium chloride     sodium chloride 10 mL/hr at 08/31/22 0700   sodium chloride     sodium chloride      ceFAZolin (ANCEF) IV Stopped (08/31/22 3086)   dexmedetomidine (PRECEDEX) IV infusion Stopped (08/31/22 5784)   epinephrine 3 mcg/min (08/31/22 0700)   lactated ringers     lactated ringers     lactated ringers 20 mL/hr at 08/31/22 0700   milrinone 0.5 mcg/kg/min (08/31/22 0700)   norepinephrine (LEVOPHED) Adult infusion Stopped (08/30/22 0625)    PRN Medications: sodium chloride, sodium chloride, acetaminophen, albuterol, dextrose, lactated ringers, midazolam, morphine injection, ondansetron (ZOFRAN) IV, mouth rinse, mouth rinse, oxyCODONE, sodium chloride flush, sodium chloride flush, traMADol   Patient Profile  Charles Holmes is a 57 y.o. 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 Admitted with cardiogenic shock.   Assessment/Plan   1.  Acute on chronic Systolic HF-->Cardiogenic Shock  - Diagnosed 11/2019. Presented with VT. LHC 70% LAD  - cMRI 12/21 concerning for sarcoid and EF 18%.  - PET 2/22 at [facility] EF 25% + active sarcoid - Echo 08/26/20 EF < 20% severely dilated LV RV mildly decreased.  -  [employer] CRT-D upgrade in 06/08/21 - Echo 07/10/22: EF <20%, RV okay, mod pericardial  effusion, mod Charles/TR - Admitted 07/25 with cardiogenic shock. - RHC: Nonobstructive CAD, severely elevated filling pressures and low Fick CO/CI (2.7/1.4) - 08/13 HM III LVAD implant + clipping LAA d/t severe thickening and invagination of mitral valve annulus impeding flows.  - Apical core sent to pathology to confirm diagnosis of cardiac sarcoid >> pending - Speed increased to 5300 on 08/14. PI trended down with higher speed. On milrinone 0.5 + Epi 5 with CI of 2. Flow 4.0. Difficulty weaning off iNO. Start sildenafil 20 TID - CVP 8. Hold off on diuresis for now. - Repeat CT head on 08/17. If bleed stable, plan to start low-dose heparin per discussion with Neuro.  2.  Acute stroke Hx CVA 06/24 -Admitted 06/24 w/ R MCA stroke. S/p TPA and mechanical clot extraction. No residual deficits. Likely cardioembolic in setting of severe LV dysfunction. - Developed left sided weakness 08/14. CTA with R MCA infarct. Taken to IR for thrombectomy - Repeat CT head with small to moderate size hemorrhagic conversion. CT head this am stable.  3. 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 Continue mexilitene  - now s/p ICD.   4. CAD - LHC 12/07/19 70-% LAD, no intervention - LHC 8/24 non obstructive CAD.  - No s/s angina - Continue statin. On aspirin for VAD.  5. Cardiac sarcoid - PET 2/22 at [facility] EF 25% + active sarcoid - Has completed prednisone.  - Will stop methotrexate with upcoming surgery.  - Sent apical core to pathology to confirm diagnosis of cardiac sarcoid  6. Paroxsymal AT/AF - Currently in SR - Anticoagulation as above   7. AKI - suspect cardiorenal, improved w/ inotropic support - Creatinine now improved to 0.8 - follow BMP    8. Iron deficiency anemia Post-op anemia - recent T sat 15%, scheduled for OP feraheme. Will complete inpatient  - Hgb 7.9. Give 1 u  RBCs.   9. Pulmonary  - PFTs with severe obstructive defect, response to bronchodilator. FEV1 1.04L, FEV1/FVC 48% - Remains intubated post CVA, wean NO then extubate  10. Elevated LFTs Elevated T bili -Post-op -AST/ALT improved, T bili 4.6 -Follow  Length of Stay: 21  FINCH, LINDSAY N, PA-C  08/31/2022, 7:25 AM  VAD Team Pager [phone] (7am - 7am)   Advanced Heart Failure Team Pager [phone] (M-F; 7a - 5p)  Please contact [facility] for night-coverage after hours (5p -7a ) and weekends on [email or web address]  Agree with above. Awake on vent. MAPs ok on epi 3 milrinone 0.5 . Required frequent albumin boluses to maintain MAPs yesterday. iNO at 1 but unable to wean off due to immediate hemodynamic deterioration  Head CT with stable ICH. Starting to move left side a bit  General:  Awake in vent  HEENT: normal  + ETT mld scleral icterus  Neck: supple. JRIJ swan   Cor: Sternal dressing and CTs ok LVAD hum.  Lungs: Clear. Abdomen: obese soft, nontender, non-distended. No hepatosplenomegaly. No bruits or masses. Hypoactive bowel sounds. Driveline site clean. Anchor in place.  Extremities: no cyanosis, clubbing, rash. Warm 1+ edema  Neuro: awake follows commands weak on left  Remains very tenuous. Very volume sensitive. Wean epi and milrinone slowly. Will hold on diuresis for now.   Will work toward extubation. Start sildenafil. Switch iNO to nasal cannula once extubated.   ICH stable. D/w Neurology. Repeat CT on Saturday am. If stable can start low dose heparin. (Ok with DVT dose  enox)  Suspect there is some hemorrhage around posterior MV annulus that will improve and allow [place] to increase VAD flows eventually.   CRITICAL CARE Performed by: Arvilla Meres  Total critical care time: 5 minutes  Critical care time was exclusive of separately billable procedures and treating other patients.  Critical care was necessary to treat or prevent imminent or life-threatening  deterioration.  Critical care was time spent personally by me (independent of midlevel providers or residents) on the following activities: development of treatment plan with patient and/or surrogate as well as nursing, discussions with consultants, evaluation of patient's response to treatment, examination of patient, obtaining history from patient or surrogate, ordering and performing treatments and interventions, ordering and review of laboratory studies, ordering and review of radiographic studies, pulse oximetry and re-evaluation of patient's condition.  Arvilla Meres, MD  1:54 PM

## 2022-08-31 NOTE — Progress Notes (Signed)
OT Cancellation Note  Patient Details Name: Charles Holmes MRN: [id number] DOB: [birth year]   Cancelled Treatment:    Reason Eval/Treat Not Completed: Medical issues which prohibited therapy.  Possible extubation this am, OT to check back as appropriate.     D  08/31/2022, 9:18 AM 08/31/2022  RP, OTR/L  Acute Rehabilitation Services  Office:  [phone]

## 2022-08-31 NOTE — Progress Notes (Signed)
Nitric dose increased from 1.0ppm to 10ppm per MD at bedside, due to hemodynamics. MD, RT and RN at bedside for wean trial. RT will monitor as needed.     08/31/22 0932  INOMAX  NO2 (ppm) (S)  0 ppm  NO (ppm) (S)  10 ppm (per MD at bedside)  Oxygen (%) 46.3 %  Tank PSI 1750 PSI (tank 2 1800)

## 2022-08-31 NOTE — Progress Notes (Signed)
HeartMate 3 Rounding Note  POD #2  Subjective:   57 year old male with cardiac sarcoidosis, nonischemic cardiomyopathy, and advanced heart failure refractory to optimal medical therapy underwent HeartMate 3 implantation on August 13 and required preoperative support with intra-aortic balloon pump and inotropic therapy.  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  Intraoperatively the patient had moderate RV dysfunction and following VAD implantation there was some obstruction to the inflow through the mitral valve from posterior annular thickening/ hematoma which was not clearly defined as the inflow cannula was in good position and far removed from the mitral valve.  After application of a left atrial clip the inflow appeared to be improved.  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 Off anticoagulation now  due to mild hemorrhagic extension by f/u CT   The nitric oxide wean could not be turned off due to low VAD flow, increased CVP from 1 ppm to zero  LVAD INTERROGATION:  HeartMate II LVAD:  Flow 4.2  liters/min, speed 5300 rpm, power 3.6, PI 4.1.  Controller intact.   Objective:    Vital Signs:   Temp:  [97.5 F (36.4 C)-98.6 F (37 C)] 98.2 F (36.8 C) (08/15 0900) Pulse Rate:  [29-197] 30 (08/15 0900) Resp:  [13-24] 15 (08/15 0900) BP: (120)/(102) 120/102 (08/15 0415) SpO2:  [95 %-100 %] 100 % (08/15 0900) Arterial Line BP: (74-112)/(57-104) 78/62 (08/15 0900) FiO2 (%):  [50 %-70 %] 50 % (08/15 0733) Weight:  [86.7 kg] 86.7 kg (08/15 0500) Last BM Date : 08/28/22 Mean arterial Pressure 88-92 mm Hg  Intake/Output:  Exam  General -responsive on vent, moving L side  2/4+ HEENT:-No bleeding from mouth at extraction sits Neck: supple. JVP . Carotids without bruits. No lymphadenopathy or thryomegaly appreciated. Cor: Mechanical heart sounds with LVAD hum present. Lungs: clear Abdomen: soft, nontender, nondistended. No hepatosplenomegaly. No bruits or masses. Good bowel sounds. Extremities: no cyanosis, clubbing, rash, edema Neuro: alert & orientedx3, cranial nerves grossly intact. moves all 4 extremities w/o difficulty. Affect pleasant  Telemetry: Atrially paced 90/min  Labs: Basic Metabolic Panel: Recent Labs  Lab 08/25/22 0900 08/25/22 1226 08/29/22 1535 08/29/22 1643 08/29/22 2215 08/30/22 0258 08/30/22 0304 08/30/22 1124 08/30/22 1657 08/30/22 1707 08/31/22 0310 08/31/22 0329  NA 128*   < > 138   < > 135  132* 133*   < > 135 135 135 132* 135  K 3.6   < > 3.6   < > 4.5  4.4 4.8   < > 4.9 4.4 4.4 4.0 4.1  CL 84*   < > 103  --  101 103  --   --  103  --  103  --   CO2 34*   < > 27  --  21* 23  --   --  22  --  23  --   GLUCOSE 146*   < > 91  --  137* 118*  --   --  128*  --  119*  --   BUN 38*   < > 9  --  11 11  --   --  9  --  9  --   CREATININE 1.13   < > 0.79  --  0.90  0.84  --   --  0.85  --  0.88  --   CALCIUM 9.1   < > 7.2*  --  7.7* 8.0*  --   --  8.0*  --  8.0*  --   MG  --    < > 1.5*  --  2.6* 2.3  --   --  2.2  --  2.2  --   PHOS 4.4  --   --   --   --  3.1  --   --   --   --  2.8  --    < > = values in this interval not displayed.    Liver Function Tests: Recent Labs  Lab 08/25/22 0900 08/27/22 0446 08/30/22 0258 08/31/22 0310  AST  --  27 218* 96*  ALT  --  38 45* 30  ALKPHOS  --  73 46 41  BILITOT  --  1.0 4.4* 4.6*  PROT  --  5.3* 5.1* 4.9*  ALBUMIN 3.3* 2.9* 3.2* 2.7*   No results for input(s): "LIPASE", "AMYLASE" in the last 168 hours. No results for input(s): "AMMONIA" in the last 168 hours.  CBC: Recent Labs  Lab 08/29/22 1535 08/29/22 1643 08/29/22 2215 08/30/22 0258 08/30/22 0304  08/30/22 1124 08/30/22 1657 08/30/22 1707 08/31/22 0310 08/31/22 0329  WBC 12.5*  --  11.0* 11.6*  --   --  12.1*  --  13.7*  --   NEUTROABS  --   --   --  9.9*  --   --   --   --  11.0*  --   HGB 9.1*   < > 9.5*  9.0* 9.0*   < > 8.8* 8.4* 8.8* 7.9* 8.2*  HCT 28.2*   < > 28.0*  28.2* 27.3*   < > 26.0* 25.9* 26.0* 24.8* 24.0*  MCV 99.6  --  99.6 96.8  --   --  100.0  --  100.0  --   PLT 144*  143*  --  163 167  --   --  144*  --  139*  --    < > = values in this interval not displayed.    INR: Recent Labs  Lab 08/28/22 0923 08/29/22 1535 08/30/22 0258 08/31/22 0310  INR 1.1 1.4* 1.4* 1.6*    Other results: EKG:   Imaging: IR PERCUTANEOUS ART THROMBECTOMY/INFUSION INTRACRANIAL INC DIAG ANGIO  Result Date: 08/31/2022 INDICATION: Acute onset of left sided weakness. 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 EXAM: 1. EMERGENT LARGE VESSEL OCCLUSION THROMBOLYSIS (anterior CIRCULATION) COMPARISON:  CT angiogram of the head and neck of August 30, 2022. MEDICATIONS: No antibiotic was administered within 1 hour of the procedure. ANESTHESIA/SEDATION: General anesthesia. CONTRAST:  Omnipaque 300 approximately 50 cc. FLUOROSCOPY TIME:  Fluoroscopy Time: 12 minutes 54 seconds (817 mGy). COMPLICATIONS: None immediate. TECHNIQUE: Following a full explanation of the procedure along with the potential associated complications, an informed witnessed consent was obtained. The risks of intracranial hemorrhage of 10%, worsening neurological deficit, ventilator dependency, death and inability to revascularize were all reviewed in detail with the patient's daughter. The patient was then put under general anesthesia by the Department of Anesthesiology at [facility]. The left groin was prepped and draped in the usual sterile fashion. Thereafter using modified Seldinger technique, transfemoral access into the right common femoral artery was obtained without difficulty. Over an 0.035 inch  guidewire an 8 French 25 cm Pinnacle sheath was  inserted. Through this, and also over an 0.035 inch guidewire a combination of a 5.5 [place] 125 cm Berenstein support catheter inside of a 087 95 cm balloon guide catheter was advanced to the aortic arch region, and advanced initially to the proximal right internal carotid artery and then more distally into the right internal carotid artery. FINDINGS: The proximal right internal carotid artery demonstrates the right internal carotid artery at the bulb to the cranial skull base to be widely patent. The petrous, the cavernous and the supraclinoid right ICA are widely patent. A prominent right posterior communicating artery is seen opacifying the right posterior cerebral artery distribution. The right middle cerebral artery is occluded at its origin. The right anterior cerebral artery opacifies into the capillary and venous phases. PROCEDURE: Through the balloon guide catheter in the distal cervical right ICA, a combination of an 071 132 cm Zoom aspiration catheter and an 021 162 cm Phenom microcatheter was advanced over an 018 inch micro guidewire with a moderate J configuration to the supraclinoid right ICA. Using a torque device, the micro guidewire was teased through firm clot into the inferior division distal M2 segment followed by the microcatheter. The guidewire was removed. Good aspiration was obtained from the hub of the microcatheter. A gentle control arteriogram performed through the microcatheter demonstrated safe positioning of the tip of the microcatheter, which was then connected to continuous heparinized saline infusion. A 4 mm x 40 mm Solitaire X retrieval device was advanced and positioned such that the proximal portion was at the origin of the right middle cerebral artery. The 071 Zoom aspiration catheter was advanced into the occluded segment of the right middle cerebral artery M1 segment. Thereafter, constant aspiration was applied at the hub of the  Zoom aspiration catheter with an aspiration pump, as aspiration was also performed at the hub of the balloon guide catheter with a 20 mL syringe for approximately a minute and a half. Thereafter, with proximal flow arrest, the combination of the retrieval device, the Zoom aspiration catheter, and the micro guidewire were removed. Following reversal of flow arrest, a control arteriogram performed through the balloon guide in the distal right ICA demonstrates complete revascularization of the right middle cerebral artery achieving a TICI 3 revascularization. The right anterior cerebral artery and the right posterior communicating artery continued to maintain complete revascularization. Final control arteriogram performed through the balloon guide catheter in the right common carotid artery demonstrate wide patency of the right internal carotid artery extra cranially and intracranially with no change in the completely revascularized right middle cerebral artery with a TICI 3 revascularization. The balloon guide was removed. An 8 French Angio-Seal closure device was deployed at the left groin puncture site. Distal pulses revealed right DP and PT, with a Dopplerable left PT without a Dopplerable left DP. Flat panel CT of the brain demonstrated contrast stain in the putamen, and the caudate heads with suggestion of hyper density in the occipital horn of the right lateral ventricle possibly representing contrast plus/minus hemorrhage. Patient was left intubated for airway protection, and transferred back to 2H in stable condition. IMPRESSION: Status post complete revascularization of occluded right middle cerebral M1 segment with 1 pass with a 4 mm x 40 mm Solitaire X retrieval device, and contact aspiration described achieving a TICI 3 revascularization. PLAN: As per referring stroke neurology service. Electronically Signed   By: Julieanne Cotton M.D.   On: 08/31/2022 09:46   IR CT Head Ltd  Result Date:  08/31/2022 INDICATION: Acute  onset of left sided weakness. 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 EXAM: 1. EMERGENT LARGE VESSEL OCCLUSION THROMBOLYSIS (anterior CIRCULATION) COMPARISON:  CT angiogram of the head and neck of August 30, 2022. MEDICATIONS: No antibiotic was administered within 1 hour of the procedure. ANESTHESIA/SEDATION: General anesthesia. CONTRAST:  Omnipaque 300 approximately 50 cc. FLUOROSCOPY TIME:  Fluoroscopy Time: 12 minutes 54 seconds (817 mGy). COMPLICATIONS: None immediate. TECHNIQUE: Following a full explanation of the procedure along with the potential associated complications, an informed witnessed consent was obtained. The risks of intracranial hemorrhage of 10%, worsening neurological deficit, ventilator dependency, death and inability to revascularize were all reviewed in detail with the patient's daughter. The patient was then put under general anesthesia by the Department of Anesthesiology at [facility]. The left groin was prepped and draped in the usual sterile fashion. Thereafter using modified Seldinger technique, transfemoral access into the right common femoral artery was obtained without difficulty. Over an 0.035 inch guidewire an 8 French 25 cm Pinnacle sheath was inserted. Through this, and also over an 0.035 inch guidewire a combination of a 5.5 [place] 125 cm Berenstein support catheter inside of a 087 95 cm balloon guide catheter was advanced to the aortic arch region, and advanced initially to the proximal right internal carotid artery and then more distally into the right internal carotid artery. FINDINGS: The proximal right internal carotid artery demonstrates the right internal carotid artery at the bulb to the cranial skull base to be widely patent. The petrous, the cavernous and the supraclinoid right ICA are widely patent. A prominent right posterior communicating artery is seen opacifying the right posterior cerebral artery distribution. The  right middle cerebral artery is occluded at its origin. The right anterior cerebral artery opacifies into the capillary and venous phases. PROCEDURE: Through the balloon guide catheter in the distal cervical right ICA, a combination of an 071 132 cm Zoom aspiration catheter and an 021 162 cm Phenom microcatheter was advanced over an 018 inch micro guidewire with a moderate J configuration to the supraclinoid right ICA. Using a torque device, the micro guidewire was teased through firm clot into the inferior division distal M2 segment followed by the microcatheter. The guidewire was removed. Good aspiration was obtained from the hub of the microcatheter. A gentle control arteriogram performed through the microcatheter demonstrated safe positioning of the tip of the microcatheter, which was then connected to continuous heparinized saline infusion. A 4 mm x 40 mm Solitaire X retrieval device was advanced and positioned such that the proximal portion was at the origin of the right middle cerebral artery. The 071 Zoom aspiration catheter was advanced into the occluded segment of the right middle cerebral artery M1 segment. Thereafter, constant aspiration was applied at the hub of the Zoom aspiration catheter with an aspiration pump, as aspiration was also performed at the hub of the balloon guide catheter with a 20 mL syringe for approximately a minute and a half. Thereafter, with proximal flow arrest, the combination of the retrieval device, the Zoom aspiration catheter, and the micro guidewire were removed. Following reversal of flow arrest, a control arteriogram performed through the balloon guide in the distal right ICA demonstrates complete revascularization of the right middle cerebral artery achieving a TICI 3 revascularization. The right anterior cerebral artery and the right posterior communicating artery continued to maintain complete revascularization. Final control arteriogram performed through the balloon  guide catheter in the right common carotid artery demonstrate wide patency of  the right internal carotid artery extra cranially and intracranially with no change in the completely revascularized right middle cerebral artery with a TICI 3 revascularization. The balloon guide was removed. An 8 French Angio-Seal closure device was deployed at the left groin puncture site. Distal pulses revealed right DP and PT, with a Dopplerable left PT without a Dopplerable left DP. Flat panel CT of the brain demonstrated contrast stain in the putamen, and the caudate heads with suggestion of hyper density in the occipital horn of the right lateral ventricle possibly representing contrast plus/minus hemorrhage. Patient was left intubated for airway protection, and transferred back to 2H in stable condition. IMPRESSION: Status post complete revascularization of occluded right middle cerebral M1 segment with 1 pass with a 4 mm x 40 mm Solitaire X retrieval device, and contact aspiration described achieving a TICI 3 revascularization. PLAN: As per referring stroke neurology service. Electronically Signed   By: Julieanne Cotton M.D.   On: 08/31/2022 09:46   IR US Guide Vasc Access Left  Result Date: 08/31/2022 INDICATION: Acute onset of left sided weakness. 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 EXAM: 1. EMERGENT LARGE VESSEL OCCLUSION THROMBOLYSIS (anterior CIRCULATION) COMPARISON:  CT angiogram of the head and neck of August 30, 2022. MEDICATIONS: No antibiotic was administered within 1 hour of the procedure. ANESTHESIA/SEDATION: General anesthesia. CONTRAST:  Omnipaque 300 approximately 50 cc. FLUOROSCOPY TIME:  Fluoroscopy Time: 12 minutes 54 seconds (817 mGy). COMPLICATIONS: None immediate. TECHNIQUE: Following a full explanation of the procedure along with the potential associated complications, an informed witnessed consent was obtained. The risks of intracranial hemorrhage of 10%, worsening neurological  deficit, ventilator dependency, death and inability to revascularize were all reviewed in detail with the patient's daughter. The patient was then put under general anesthesia by the Department of Anesthesiology at [facility]. The left groin was prepped and draped in the usual sterile fashion. Thereafter using modified Seldinger technique, transfemoral access into the right common femoral artery was obtained without difficulty. Over an 0.035 inch guidewire an 8 French 25 cm Pinnacle sheath was inserted. Through this, and also over an 0.035 inch guidewire a combination of a 5.5 [place] 125 cm Berenstein support catheter inside of a 087 95 cm balloon guide catheter was advanced to the aortic arch region, and advanced initially to the proximal right internal carotid artery and then more distally into the right internal carotid artery. FINDINGS: The proximal right internal carotid artery demonstrates the right internal carotid artery at the bulb to the cranial skull base to be widely patent. The petrous, the cavernous and the supraclinoid right ICA are widely patent. A prominent right posterior communicating artery is seen opacifying the right posterior cerebral artery distribution. The right middle cerebral artery is occluded at its origin. The right anterior cerebral artery opacifies into the capillary and venous phases. PROCEDURE: Through the balloon guide catheter in the distal cervical right ICA, a combination of an 071 132 cm Zoom aspiration catheter and an 021 162 cm Phenom microcatheter was advanced over an 018 inch micro guidewire with a moderate J configuration to the supraclinoid right ICA. Using a torque device, the micro guidewire was teased through firm clot into the inferior division distal M2 segment followed by the microcatheter. The guidewire was removed. Good aspiration was obtained from the hub of the microcatheter. A gentle control arteriogram performed through the microcatheter demonstrated  safe positioning of the tip of the microcatheter, which was then connected to continuous  heparinized saline infusion. A 4 mm x 40 mm Solitaire X retrieval device was advanced and positioned such that the proximal portion was at the origin of the right middle cerebral artery. The 071 Zoom aspiration catheter was advanced into the occluded segment of the right middle cerebral artery M1 segment. Thereafter, constant aspiration was applied at the hub of the Zoom aspiration catheter with an aspiration pump, as aspiration was also performed at the hub of the balloon guide catheter with a 20 mL syringe for approximately a minute and a half. Thereafter, with proximal flow arrest, the combination of the retrieval device, the Zoom aspiration catheter, and the micro guidewire were removed. Following reversal of flow arrest, a control arteriogram performed through the balloon guide in the distal right ICA demonstrates complete revascularization of the right middle cerebral artery achieving a TICI 3 revascularization. The right anterior cerebral artery and the right posterior communicating artery continued to maintain complete revascularization. Final control arteriogram performed through the balloon guide catheter in the right common carotid artery demonstrate wide patency of the right internal carotid artery extra cranially and intracranially with no change in the completely revascularized right middle cerebral artery with a TICI 3 revascularization. The balloon guide was removed. An 8 French Angio-Seal closure device was deployed at the left groin puncture site. Distal pulses revealed right DP and PT, with a Dopplerable left PT without a Dopplerable left DP. Flat panel CT of the brain demonstrated contrast stain in the putamen, and the caudate heads with suggestion of hyper density in the occipital horn of the right lateral ventricle possibly representing contrast plus/minus hemorrhage. Patient was left intubated for airway  protection, and transferred back to 2H in stable condition. IMPRESSION: Status post complete revascularization of occluded right middle cerebral M1 segment with 1 pass with a 4 mm x 40 mm Solitaire X retrieval device, and contact aspiration described achieving a TICI 3 revascularization. PLAN: As per referring stroke neurology service. Electronically Signed   By: Julieanne Cotton M.D.   On: 08/31/2022 09:46   CT HEAD WO CONTRAST ( )  Result Date: 08/31/2022 CLINICAL DATA:  Stroke follow-up EXAM: CT HEAD WITHOUT CONTRAST TECHNIQUE: Contiguous axial images were obtained from the base of the skull through the vertex without intravenous contrast. RADIATION DOSE REDUCTION: This exam was performed according to the departmental dose-optimization program which includes automated exposure control, adjustment of the mA and/or kV according to patient size and/or use of iterative reconstruction technique. COMPARISON:  Yesterday FINDINGS: Brain: Hematoma at the right caudate head with mild intraventricular extension is unchanged. Hazy high density at the right putamen show some fading, residual attributed to petechial blood. The extent of acute infarct in the right temporal cortex and right basal ganglia appears non progressed. No hydrocephalus. Vascular: No hyperdense vessel or unexpected calcification. Skull: Normal. Negative for fracture or focal lesion. Sinuses/Orbits: No acute finding. IMPRESSION: Unchanged extent of infarct and hemorrhage in the right MCA distribution including small volume intraventricular clot. No hydrocephalus. Electronically Signed   By: Tiburcio Pea M.D.   On: 08/31/2022 06:02   CT HEAD WO CONTRAST ( )  Result Date: 08/30/2022 CLINICAL DATA:  Stroke, follow up left sided weakness. EXAM: CT HEAD WITHOUT CONTRAST TECHNIQUE: Contiguous axial images were obtained from the base of the skull through the vertex without intravenous contrast. RADIATION DOSE REDUCTION: This exam was performed  according to the departmental dose-optimization program which includes automated exposure control, adjustment of the mA and/or kV according to patient size and/or use of  iterative reconstruction technique. COMPARISON:  Head CT 08/30/2022. FINDINGS: Brain: Hemorrhagic transformation of evolving infarct in the right caudate nucleus with small volume intraventricular extension of hemorrhage. No acute hydrocephalus. New hyperdensity within the lentiform nucleus may reflect petechial hemorrhage versus contrast staining. Surrounding hypoattenuation in the right internal capsule and corona radiata is favored to reflect evolving infarct. Unchanged old infarct in the right anterior temporal lobe. No extra-axial collection, significant mass effect or midline shift. Vascular: No hyperdense vessel or unexpected calcification. Skull: No calvarial fracture or suspicious bone lesion. Skull base is unremarkable. Sinuses/Orbits: No acute findings. Other: None. IMPRESSION: 1. Hemorrhagic transformation of evolving infarct in the right caudate nucleus with small volume intraventricular extension of hemorrhage. No acute hydrocephalus. 2. New hyperdensity within the right lentiform nucleus may reflect petechial hemorrhage versus contrast staining. 3. Surrounding hypoattenuation in the right internal capsule and corona radiata is favored to reflect evolving infarct. Critical Value/emergent results were relayed via secure text page at the time of interpretation on 08/30/2022 at 4:32 pm to provider Dr. Selina Cooley, who acknowledged these results. Electronically Signed   By: Orvan Falconer M.D.   On: 08/30/2022 16:42   DG Chest Port 1 View  Result Date: 08/30/2022 CLINICAL DATA:  Left ventricular assist device.  Follow-. EXAM: PORTABLE CHEST 1 VIEW COMPARISON:  Radiographs 08/29/2022 and 08/28/2022.  CT 08/11/2022. FINDINGS: 0924 hours. No significant change in position of the endotracheal tube, enteric tube, right IJ Swan-Ganz catheter and  bilateral chest tubes. Left subclavian AICD leads are unchanged. The left ventricular assist device appears unchanged in position. The heart size and mediastinal contours are stable with mild cardiomegaly. Stable mild atelectasis at both lung bases. No evidence of pneumothorax, significant pleural effusion or acute osseous abnormality. IMPRESSION: Stable postoperative chest with unchanged support system and mild bibasilar atelectasis. No pneumothorax or significant pleural effusion. Electronically Signed   By: Carey Bullocks M.D.   On: 08/30/2022 13:27   CT ANGIO HEAD NECK W WO CM W PERF (CODE STROKE)  Result Date: 08/30/2022 CLINICAL DATA:  Left-sided weakness. EXAM: CT ANGIOGRAPHY HEAD AND NECK CT PERFUSION BRAIN TECHNIQUE: Multidetector CT imaging of the head and neck was performed using the standard protocol during bolus administration of intravenous contrast. Multiplanar CT image reconstructions and MIPs were obtained to evaluate the vascular anatomy. Carotid stenosis measurements (when applicable) are obtained utilizing NASCET criteria, using the distal internal carotid diameter as the denominator. Multiphase CT imaging of the brain was performed following IV bolus contrast injection. Subsequent parametric perfusion maps were calculated using RAPID software. RADIATION DOSE REDUCTION: This exam was performed according to the departmental dose-optimization program which includes automated exposure control, adjustment of the mA and/or kV according to patient size and/or use of iterative reconstruction technique. CONTRAST:  OMNIPAQUE IOHEXOL 350 MG/ML SOLN COMPARISON:  Noncontrast head CT from earlier today. FINDINGS: CTA NECK FINDINGS Aortic arch: Changes of recent cardiopulmonary bypass. No acute finding including dissection. Right carotid system: Mild atheromatous plaque at the bifurcation. No stenosis or ulceration. Left carotid system: Vessels are smoothly contoured and widely patent. Vertebral  arteries: No proximal subclavian stenosis. Left dominant vertebral artery. Vessels are smoothly contoured and diffusely patent Skeleton: No acute finding Other neck: Unremarkable hardware traversing the neck. No acute finding Upper chest: Changes of recent median sternotomy with chest tubes and small bilateral pneumothorax. Review of the MIP images confirms the above findings CTA HEAD FINDINGS Anterior circulation: Right M1 occlusion with luminal clot appearance. There is some downstream branch reconstitution with underfilling.  No additional embolus is noted. Negative for aneurysm. Posterior circulation: Strong left vertebral artery dominance. The right vertebral artery ends in the PICA. Fetal type right PCA flow. No branch occlusion, beading, or aneurysm Venous sinuses: Diffusely patent Anatomic variants: None significant Review of the MIP images confirms the above findings CT Brain Perfusion Findings: ASPECTS: 7 CBF (<30%) Volume: 12mL Perfusion (Tmax>6.0s) volume: Mismatch Volume: 90mL Infarction Location:In the right MCA distribution, especially at the basal ganglia. The core infarct is somewhat underestimated relative to the aspects score especially at the temporal cortex. Neurology is already aware of the findings as confirmed in epic chat. IMPRESSION: 1. 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 2. Core infarct of 12 cc (somewhat underestimated compared to aspects) with 90 cc of penumbra. 3. Mild atherosclerosis. Electronically Signed   By: Tiburcio Pea M.D.   On: 08/30/2022 06:24   CT HEAD CODE STROKE WO CONTRAST  Result Date: 08/30/2022 CLINICAL DATA:  Code stroke.  Left-sided weakness EXAM: CT HEAD WITHOUT CONTRAST TECHNIQUE: Contiguous axial images were obtained from the base of the skull through the vertex without intravenous contrast. RADIATION DOSE REDUCTION: This exam was performed according to the departmental dose-optimization program which includes automated exposure  control, adjustment of the mA and/or kV according to patient size and/or use of iterative reconstruction technique. COMPARISON:  07/11/2022 FINDINGS: Brain: Cytotoxic edema in the lateral right temporal cortex, putamen, and caudate head. No acute hemorrhage. No hydrocephalus or masslike finding. Vascular: No hyperdense vessel or unexpected calcification. Skull: Normal. Negative for fracture or focal lesion. Sinuses/Orbits: No acute finding. Other: These results were communicated to Dr. Derry Lory at 5:37 am on 08/30/2022 by epic chat. ASPECTS [facility] Stroke Program Early CT Score) - Ganglionic level infarction (caudate, lentiform nuclei, internal capsule, insula, M1-M3 cortex): 4 - Supraganglionic infarction (M4-M6 cortex): 3 Total score (0-10 with 10 being normal): 7 IMPRESSION: 1. 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 ASPECTS is 7. 2. No acute hemorrhage. Electronically Signed   By: Tiburcio Pea M.D.   On: 08/30/2022 05:38   DG Chest Port 1 View  Result Date: 08/29/2022 CLINICAL DATA:  LVAD placement. EXAM: PORTABLE CHEST 1 VIEW COMPARISON:  Chest radiograph dated 08/28/2022. FINDINGS: Endotracheal tube with tip approximately 5 cm above the carina. Right IJ Swan-Ganz with tip over the mediastinum likely in the proximal right main pulmonary artery. Interval removal of the right-sided PICC. There has been placement of a left ventricular assist device. Inferiorly approached drainage catheters noted. Left pectoral AICD device. Median sternotomy wires and stable cardiomegaly. Left mid lung field atelectasis. No pleural effusion or pneumothorax. IMPRESSION: 1. Support apparatus as above. 2. Left mid lung field atelectasis. Electronically Signed   By: Elgie Collard M.D.   On: 08/29/2022 16:08   EP STUDY  Result Date: 08/29/2022 See surgical note for result.    Medications:     Scheduled Medications:  sodium chloride   Intravenous Once   acetaminophen  1,000 mg Oral Q6H    Or   acetaminophen (TYLENOL) oral liquid 160 mg/5 mL  1,000 mg Per Tube Q6H   aspirin EC  325 mg Oral Daily   Or   aspirin  324 mg Per Tube Daily   Or   aspirin  300 mg Rectal Daily   atorvastatin  80 mg Per Tube Daily   bisacodyl  10 mg Oral Daily   Or   bisacodyl  10 mg Rectal Daily   chlorhexidine  15  mL Mouth/Throat TID   Chlorhexidine Gluconate Cloth  6 each Topical Q0600   docusate  200 mg Per Tube Daily   dorzolamide-timolol  1 drop Both Eyes BID   insulin aspart  0-24 Units Subcutaneous Q4H   latanoprost  1 drop Both Eyes QHS   metoCLOPramide (REGLAN) injection  10 mg Intravenous Q6H   mexiletine  250 mg Per Tube BID   mometasone-formoterol  2 puff Inhalation BID   multivitamin with minerals  1 tablet Per Tube Daily   mouth rinse  15 mL Mouth Rinse Q2H   pantoprazole (PROTONIX) IV  40 mg Intravenous QHS   sildenafil  20 mg Per Tube TID   sodium chloride flush  3 mL Intravenous Q12H   sodium chloride flush  3 mL Intravenous Q12H   umeclidinium bromide  1 puff Inhalation Daily    Infusions:  sodium chloride Stopped (08/30/22 0516)   sodium chloride 10 mL/hr at 08/31/22 0800   sodium chloride     sodium chloride 10 mL/hr at 08/31/22 0800   sodium chloride     sodium chloride      ceFAZolin (ANCEF) IV Stopped (08/31/22 1324)   dexmedetomidine (PRECEDEX) IV infusion Stopped (08/31/22 4010)   epinephrine 4 mcg/min (08/31/22 0800)   lactated ringers     lactated ringers     lactated ringers 20 mL/hr at 08/31/22 0800   milrinone 0.5 mcg/kg/min (08/31/22 0800)   norepinephrine (LEVOPHED) Adult infusion Stopped (08/30/22 0625)    PRN Medications: sodium chloride, sodium chloride, acetaminophen, albuterol, dextrose, lactated ringers, midazolam, morphine injection, ondansetron (ZOFRAN) IV, mouth rinse, mouth rinse, oxyCODONE, sodium chloride flush, sodium chloride flush, traMADol   Assessment:  57 year 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  Postoperative  right middle cerebral artery CVA treated with neuro-- radiology invention,   Postop RV dysfunction req inhaled NO and inotropes with incresad bilirubin 1.2>> 4.9 Plan/Discussion:   Plan vent wean but cont inhaled NO through nasal cannula. ABG, CXR, neurostatus improved  Hold coumadin IV heparin   I reviewed the LVAD parameters from today, and compared the results to the patient's prior recorded data.  No programming changes were made.  The LVAD is functioning within specified parameters.  The patient performs LVAD self-test daily.  LVAD interrogation was negative for any significant power changes, alarms or PI events/speed drops.  LVAD equipment check completed and is in good working order.  Back-up equipment present.   LVAD education done on emergency procedures and precautions and reviewed exit site care.  Length of Stay: [address]  Lovett Sox 08/31/2022, 10:17 AM

## 2022-08-31 NOTE — Progress Notes (Signed)
Patient was transported to CT via the ventilator and nitric with no immediate complications.

## 2022-08-31 NOTE — Evaluation (Signed)
Occupational Therapy Evaluation Patient Details Name: Charles Holmes MRN: [id number] DOB: 08-28-1965 Today's Date: 08/31/2022   History of Present Illness Pt is 57 year old presented to [facility] on  08/10/22 for acute on chronic systolic heart failure. Pt being evaluated for LVAD. PMH - CAD, CHF, HLD, HTN, systolic HF, ICD implant, rt CVA.  LVAD placement and new stroke R MCA M1.   Clinical Impression   Patient admitted for the diagnosis above.  PTA he lives alone at home, but will be staying with family once discharged.  Patient session limited to bedlevel this date, currently needing near total assist with mobility and ADL completion.  OT will follow in the acute setting to address deficits, and Patient will benefit from intensive inpatient follow up therapy, >3 hours/day         If plan is discharge home, recommend the following: Two people to help with walking and/or transfers;Help with stairs or ramp for entrance;Assistance with feeding;Assist for transportation;Assistance with cooking/housework;A lot of help with bathing/dressing/bathroom    Functional Status Assessment  Patient has had a recent decline in their functional status and demonstrates the ability to make significant improvements in function in a reasonable and predictable amount of time.  Equipment Recommendations  Other (comment) (TBD)    Recommendations for Other Services       Precautions / Restrictions Precautions Precautions: None;Fall Precaution Comments: LVAD, Swan, chest tubes, multiple lines.  Extubated 8/15. Restrictions Weight Bearing Restrictions: Yes Other Position/Activity Restrictions: Sternal Precautions      Mobility Bed Mobility               General bed mobility comments: Restricted this session, just extubated.    Transfers                          Balance                                           ADL either performed or assessed with clinical  judgement   ADL                                         General ADL Comments: To be assessed once able to sit EOB     Vision Patient Visual Report: No change from baseline Additional Comments: Patient with R gaze preference, but able to cross midline.     Perception Perception: Within Functional Limits       Praxis Praxis: WFL       Pertinent Vitals/Pain Pain Assessment Pain Assessment: Faces Pain Score: 5  Pain Location: Sternal incision Pain Descriptors / Indicators: Tender Pain Intervention(s): Monitored during session     Extremity/Trunk Assessment Upper Extremity Assessment Upper Extremity Assessment: Generalized weakness;Right hand dominant;RUE deficits/detail;LUE deficits/detail RUE Deficits / Details: WFL for strength and AROM RUE Sensation: WNL RUE Coordination: WNL LUE Deficits / Details: Minimal ext to fingers, able to achieve 1/2 finger flex.  Minimal wrist flex/ext and supination.  Trace to bicep flex, and trance to shoulder complex. LUE Sensation: decreased light touch LUE Coordination: decreased fine motor;decreased gross motor   Lower Extremity Assessment Lower Extremity Assessment: Defer to PT evaluation       Communication Communication Communication: No apparent difficulties Cueing Techniques: Verbal cues  Cognition Arousal: Alert Behavior During Therapy: WFL for tasks assessed/performed Overall Cognitive Status: Within Functional Limits for tasks assessed                                 General Comments: Initially disoriented to Month stating April     General Comments       Exercises     Shoulder Instructions      Home Living Family/patient expects to be discharged to:: Private residence Living Arrangements: Alone Available Help at Discharge: Available PRN/intermittently Type of Home: Mobile home Home Access: Stairs to enter [employer] of Steps: 2 Entrance Stairs-Rails:  Right;Left Home Layout: One level     Bathroom Shower/Tub: Producer, television/film/video: Standard Bathroom Accessibility: Yes How Accessible: Accessible via walker Home Equipment: None          Prior Functioning/Environment Prior Level of Function : Independent/Modified Independent;Driving               ADLs Comments: does cooking / cleaning        OT Problem List: Decreased strength;Decreased range of motion;Decreased activity tolerance;Impaired balance (sitting and/or standing);Impaired UE functional use;Increased edema;Pain      OT Treatment/Interventions: Self-care/ADL training;Therapeutic exercise;Therapeutic activities;Balance training;Patient/family education;DME and/or AE instruction;Neuromuscular education    OT Goals(Current goals can be found in the care plan section) Acute Rehab OT Goals Patient Stated Goal: Go back home OT Goal Formulation: With patient Time For Goal Achievement: 09/14/22 Potential to Achieve Goals: Fair ADL Goals Pt Will Perform Grooming: with contact guard assist;sitting Pt Will Perform Upper Body Bathing: with min assist;sitting Pt Will Perform Upper Body Dressing: with mod assist;sitting Pt Will Transfer to Toilet: with mod assist;squat pivot transfer;bedside commode Additional ADL Goal #1: Tolerate sitting EOB up to 5 min with supervision to increase independince with toileting.  OT Frequency: Min 1X/week    Co-evaluation              AM-PAC OT "6 Clicks" Daily Activity     Outcome Measure Help from another person eating meals?: A Lot Help from another person taking care of personal grooming?: A Lot Help from another person toileting, which includes using toliet, bedpan, or urinal?: Total Help from another person bathing (including washing, rinsing, drying)?: A Lot Help from another person to put on and taking off regular upper body clothing?: A Lot Help from another person to put on and taking off regular lower body  clothing?: Total 6 Click Score: 10   End of Session Nurse Communication: Mobility status  Activity Tolerance: Treatment limited secondary to medical complications (Comment) Patient left: in bed;with call bell/phone within reach  OT Visit Diagnosis: Muscle weakness (generalized) (M62.81)                Time: 8657-8469 OT Time Calculation (min): 16 min Charges:  OT General Charges $OT Visit: 1 Visit OT Evaluation $OT Eval Moderate Complexity: 1 Mod  08/31/2022  RP, OTR/L  Acute Rehabilitation Services  Office:  [phone]   Charles Holmes 08/31/2022, 2:47 PM

## 2022-08-31 NOTE — Progress Notes (Signed)
Pt not tolerating Nitric wean  from 1.0 ppm to 0ppm at this time due to hemodynamics. Pt placed pt back on dose of 1.0ppm. hemodynamics improved. RT and RN at bedside for wean. RT will monitor as needed.

## 2022-08-31 NOTE — Anesthesia Postprocedure Evaluation (Signed)
Anesthesia Post Note  Patient: DREWEY HOLEN  Procedure(s) Performed: IR WITH ANESTHESIA     Patient location during evaluation: SICU Anesthesia Type: General Level of consciousness: awake and alert, oriented and patient cooperative Pain management: pain level controlled Vital Signs Assessment: post-procedure vital signs reviewed and stable Respiratory status: nonlabored ventilation, patient connected to nasal cannula oxygen, spontaneous breathing and respiratory function stable (extubated) Cardiovascular status: stable (LVAD) Anesthetic complications: no Comments: Moving L arm and leg some s/p CVA/clot retrieval    No notable events documented.  Last Vitals:  Vitals:   08/31/22 1500 08/31/22 1600  BP:    Pulse: (!) 101 (!) 104  Resp: 12 16  Temp: 36.9 C 36.9 C  SpO2: 99% 98%    Last Pain:  Vitals:   08/31/22 1600  TempSrc: Core  PainSc:                  ,E. 

## 2022-08-31 NOTE — Progress Notes (Signed)
This chaplain is present at the bedside for a brief spiritual care F/U. The Pt. is awake and requests prayer for the himself and family.  The chaplain will continue intercessory prayer and prayer by invitation with the family.  Chaplain Stephanie Acre [phone]

## 2022-08-31 NOTE — Progress Notes (Signed)
LVAD Coordinator Rounding Note:  Admitted 08/10/22 due to acute on chronic CHF with cardiogenic shock. Milrinone dependent. Advance therapy workup completed, and pt deemed acceptable VAD candidate. Dental extractions completed 8/6. IABP placed 08/25/22.  HM 3 LVAD implanted on 08/29/22 by Dr Donata Clay under destination therapy criteria. 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 Result pending.  7/25 Admit with cardiogenic shock. Started milrinone and NE. 8/6 S/P 13 teeth extractions  8/9 IABP placed 8/13 S/p HM III LVAD implant + clipping left atrial appendage d/t severe thickening and invagination of mitral valve annulus impeding flows  CT Head 8/14 (initial) 1. 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 ASPECTS is 7. 2. No acute hemorrhage.  CT Angio Head/Neck 1. 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 2. Core infarct of 12 cc (somewhat underestimated compared to aspects) with 90 cc of penumbra. 3. Mild atherosclerosis.  Pt taken emergently to IR for percutaneous right common carotid arteriogram with thrombectomy. Revascularization achieved. Angio-seal closure device applied to left groin- clean, dry, and intact.   CT Head 8/15 @ 0450 Unchanged extent of infarct and hemorrhage in the right MCA distribution including small volume intraventricular clot. No hydrocephalus.  Pt remains intubated, pt is on SBT. Able to follow commands. Pt squeezed my hand on left and right. Left is weaker than right. Moves foot/toes to command on left and right.  Speed increased to 5300 per Dr Gala Romney. Tolerating well.   Vital signs: Temp: 98.2 HR: AV paced 80 Doppler Pressure: 80 Arterial BP: 79/67 (76) O2 Sat: 99% on 50% FiO2--pt on SBT Wt: 183.6>191.1 lbs   LVAD interrogation reveals:  Speed: 5300 Flow: 4.1 Power: 3.6 w PI: 2.8   Alarms: none Events:  1 PI today  Hematocrit: 20-DO NOT ADJUST Fixed speed: 5300 Low speed  limit: 5000  Drive Line: Existing VAD dressing removed and site care performed using sterile technique. Drive line exit site cleaned with Chlora prep applicators x 2, allowed to dry, and silver strip with gauze applied. Exit site healing and unincorporated, the velour is fully implanted at exit site. Scant amount of serosanguinous/dried bloody drainage. No redness, tenderness, foul odor or rash noted. 2 suture in place. Drive line anchor secure. Continue daily dressing changes. Next dressing change due Thursday 09/01/22 by VAD coordinator or nurse champion only.     Labs:  LDH trend: 596>441  INR trend: 1.4>1.6  AST/ALT trend: 218/45>96/30  Total Bili trend: 4.4>4.6  WBC trend: 11.6>13.7  Anticoagulation Plan: -INR Goal: 2.0 - 2.5 -ASA Dose: 325 mg   Blood Products:  IntraOp 8/14: - 4 FFP - 2 Platelets - 2 PRBC - 1 cyro - 449 cc of cellsaver - DDAVP 20 mcg x 1   Device: [employer]onic BiV -  -Therapies: OFF  Arrythmias:   Respiratory: Intubated. Nitric 1 ppm.   Infection:   Renal:  -BUN/CRT: 11/0.84  Adverse Events on VAD: - 08/30/22: - Developed left sided weakness this am. CTA with R MCA infarct. Taken to IR for thrombectomy   Drips:  Epinephrine 4 mcg/min Milrinone 0.5 mcg/kg/min Precedex 1.2 mcg/kg/hr--off Levophed-- off Insulin-- off  Patient Education: Pt intubated. Education inappropriate at this time.  Plan/Recommendations:  Please page VAD coordinator for any alarms or VAD equipment issues. Daily dressing changes by VAD coordinator or Nurse Phyllis Ginger RN VAD Coordinator  Office: [phone]  24/7 Pager: [phone]

## 2022-08-31 NOTE — Progress Notes (Signed)
Pt not tolerating Nitric wean  from 1.0 ppm to 0ppm at this time due to hemodynamics. Pt placed pt back on dose of 1.0ppm. hemodynamics improved. MD, RT and RN at bedside for wean. RT will monitor as needed.      08/31/22 0930  INOMAX  NO2 (ppm) 0 ppm  NO (ppm) (S)  0 ppm (wean trial)  Oxygen (%) 46.1 %

## 2022-08-31 NOTE — Progress Notes (Signed)
Patient ID: Charles Holmes, male   DOB: 05-11-1965, 57 y.o.   MRN: [id number]  TCTS Evening Rounds:  Hemodynamically stable on milrinone 0.5, epi 5.  CI 3.37 LVAD parameters stable.  UO ok CT output low.  Left arm improving.  Will need speech therapy swallowing eval. Keep NPO

## 2022-08-31 NOTE — Plan of Care (Signed)

## 2022-08-31 NOTE — Progress Notes (Signed)
Attempted Nitric Oxide Wean:  iNO 1ppm 0ppm  Time 0730 0733  FLOW 4.4 3.5  PI 3.2 1.6  CVP 10 17  MAP 84 72  Pt placed back on 1 iNO by RT.

## 2022-08-31 NOTE — Progress Notes (Signed)
Referring Physician(s): Dr Lina Sayre  Supervising Physician: Julieanne Cotton  Patient Status:  [facility] - In-pt  Chief Complaint:  CVA  Subjective:  NIR procedure 8/14 am: Status post complete revascularization of occluded right middle cerebral artery M1 segment with 1 pass with the 071 aspiration device with a 4 mm x 40 mm SolitaireX retriever  device achieving a TICI 3 revascularization   Doing well; still intubated Nods appropriately Moving Rt well and to command Moving left leg slowly but much better Left hand minimal movement   Allergies: Amiodarone and Percocet [oxycodone-acetaminophen]  Medications: Prior to Admission medications   Medication Sig Start Date End Date Taking? Authorizing Provider  apixaban (ELIQUIS) 5 MG TABS tablet Take 1 tablet (5 mg total) by mouth 2 (two) times daily. 07/26/22  Yes Robbie Lis M, PA-C  atorvastatin (LIPITOR) 80 MG tablet Take 1 tablet (80 mg total) by mouth daily. 07/26/22  Yes Robbie Lis M, PA-C  carvedilol (COREG) 3.125 MG tablet Take 1 tablet (3.125 mg total) by mouth 2 (two) times daily. 07/26/22  Yes Robbie Lis M, PA-C  dapagliflozin propanediol (FARXIGA) 10 MG TABS tablet Take 1 tablet (10 mg total) by mouth daily before breakfast. 07/26/22  Yes Robbie Lis M, PA-C  dorzolamide-timolol (COSOPT) 22.3-6.8 MG/ML ophthalmic solution Place 1 drop into both eyes in the morning and at bedtime. 04/29/20  Yes [provider]  fluticasone-salmeterol (ADVAIR HFA) 230-21 MCG/ACT inhaler Inhale 2 puffs into the lungs 2 (two) times daily. 12/13/20  Yes Olalere, Adewale A, MD  folic acid (FOLVITE) 1 MG tablet Take 1 tablet (1 mg total) by mouth daily. 07/26/22  Yes Robbie Lis M, PA-C  furosemide (LASIX) 40 MG tablet Take 1 tablet (40 mg total) by mouth 2 (two) times daily. 07/27/22  Yes Laurey Morale, MD  latanoprost (XALATAN) 0.005 % ophthalmic solution Place 1 drop into both eyes at bedtime.   Yes  [provider]  methotrexate (RHEUMATREX) 2.5 MG tablet Take 8 tablets (20 mg total) by mouth once a week. Patient taking differently: Take 20 mg by mouth once a week. Saturdays 07/26/22  Yes Robbie Lis M, PA-C  mexiletine (MEXITIL) 250 MG capsule Take 1 capsule (250 mg total) by mouth 2 (two) times daily. 11/07/21  Yes Camnitz, Will Daphine Deutscher, MD  pantoprazole (PROTONIX) 40 MG tablet Take 40 mg by mouth daily before breakfast.   Yes [provider]  spironolactone (ALDACTONE) 25 MG tablet TAKE 1/2 TABLET BY MOUTH DAILY Patient taking differently: Take 12.5 mg by mouth at bedtime. 06/16/22  Yes Bensimhon, Bevelyn Buckles, MD     Vital Signs: BP (!) 120/102 (BP Location: Left Arm)   Pulse (!) 30   Temp 98.2 F (36.8 C)   Resp 15   Ht 5\' 7"  (1.702 m)   Wt 191 lb 2.2 oz (86.7 kg)   SpO2 100%   BMI 29.94 kg/m   Physical Exam Vitals reviewed.  Musculoskeletal:     Comments: Moving Rt side well and to command Left leg slow but better today Left hand not much   Skin:    Comments: Left groin NT no bleeding; soft No hematoma Left foot good pulses  Neurological:     Mental Status: He is alert.     Imaging: CT HEAD WO CONTRAST ( )  Result Date: 08/31/2022 CLINICAL DATA:  Stroke follow-up EXAM: CT HEAD WITHOUT CONTRAST TECHNIQUE: Contiguous axial images were obtained from the base of the skull through the vertex without intravenous contrast.  RADIATION DOSE REDUCTION: This exam was performed according to the departmental dose-optimization program which includes automated exposure control, adjustment of the mA and/or kV according to patient size and/or use of iterative reconstruction technique. COMPARISON:  Yesterday FINDINGS: Brain: Hematoma at the right caudate head with mild intraventricular extension is unchanged. Hazy high density at the right putamen show some fading, residual attributed to petechial blood. The extent of acute infarct in the right temporal cortex and  right basal ganglia appears non progressed. No hydrocephalus. Vascular: No hyperdense vessel or unexpected calcification. Skull: Normal. Negative for fracture or focal lesion. Sinuses/Orbits: No acute finding. IMPRESSION: Unchanged extent of infarct and hemorrhage in the right MCA distribution including small volume intraventricular clot. No hydrocephalus. Electronically Signed   By: Tiburcio Pea M.D.   On: 08/31/2022 06:02   CT HEAD WO CONTRAST ( )  Result Date: 08/30/2022 CLINICAL DATA:  Stroke, follow up left sided weakness. EXAM: CT HEAD WITHOUT CONTRAST TECHNIQUE: Contiguous axial images were obtained from the base of the skull through the vertex without intravenous contrast. RADIATION DOSE REDUCTION: This exam was performed according to the departmental dose-optimization program which includes automated exposure control, adjustment of the mA and/or kV according to patient size and/or use of iterative reconstruction technique. COMPARISON:  Head CT 08/30/2022. FINDINGS: Brain: Hemorrhagic transformation of evolving infarct in the right caudate nucleus with small volume intraventricular extension of hemorrhage. No acute hydrocephalus. New hyperdensity within the lentiform nucleus may reflect petechial hemorrhage versus contrast staining. Surrounding hypoattenuation in the right internal capsule and corona radiata is favored to reflect evolving infarct. Unchanged old infarct in the right anterior temporal lobe. No extra-axial collection, significant mass effect or midline shift. Vascular: No hyperdense vessel or unexpected calcification. Skull: No calvarial fracture or suspicious bone lesion. Skull base is unremarkable. Sinuses/Orbits: No acute findings. Other: None. IMPRESSION: 1. Hemorrhagic transformation of evolving infarct in the right caudate nucleus with small volume intraventricular extension of hemorrhage. No acute hydrocephalus. 2. New hyperdensity within the right lentiform nucleus may reflect  petechial hemorrhage versus contrast staining. 3. Surrounding hypoattenuation in the right internal capsule and corona radiata is favored to reflect evolving infarct. Critical Value/emergent results were relayed via secure text page at the time of interpretation on 08/30/2022 at 4:32 pm to provider Dr. Selina Cooley, who acknowledged these results. Electronically Signed   By: Orvan Falconer M.D.   On: 08/30/2022 16:42   DG Chest Port 1 View  Result Date: 08/30/2022 CLINICAL DATA:  Left ventricular assist device.  Follow-. EXAM: PORTABLE CHEST 1 VIEW COMPARISON:  Radiographs 08/29/2022 and 08/28/2022.  CT 08/11/2022. FINDINGS: 0924 hours. No significant change in position of the endotracheal tube, enteric tube, right IJ Swan-Ganz catheter and bilateral chest tubes. Left subclavian AICD leads are unchanged. The left ventricular assist device appears unchanged in position. The heart size and mediastinal contours are stable with mild cardiomegaly. Stable mild atelectasis at both lung bases. No evidence of pneumothorax, significant pleural effusion or acute osseous abnormality. IMPRESSION: Stable postoperative chest with unchanged support system and mild bibasilar atelectasis. No pneumothorax or significant pleural effusion. Electronically Signed   By: Carey Bullocks M.D.   On: 08/30/2022 13:27   CT ANGIO HEAD NECK W WO CM W PERF (CODE STROKE)  Result Date: 08/30/2022 CLINICAL DATA:  Left-sided weakness. EXAM: CT ANGIOGRAPHY HEAD AND NECK CT PERFUSION BRAIN TECHNIQUE: Multidetector CT imaging of the head and neck was performed using the standard protocol during bolus administration of intravenous contrast. Multiplanar CT image reconstructions and MIPs  were obtained to evaluate the vascular anatomy. Carotid stenosis measurements (when applicable) are obtained utilizing NASCET criteria, using the distal internal carotid diameter as the denominator. Multiphase CT imaging of the brain was performed following IV bolus  contrast injection. Subsequent parametric perfusion maps were calculated using RAPID software. RADIATION DOSE REDUCTION: This exam was performed according to the departmental dose-optimization program which includes automated exposure control, adjustment of the mA and/or kV according to patient size and/or use of iterative reconstruction technique. CONTRAST:  OMNIPAQUE IOHEXOL 350 MG/ML SOLN COMPARISON:  Noncontrast head CT from earlier today. FINDINGS: CTA NECK FINDINGS Aortic arch: Changes of recent cardiopulmonary bypass. No acute finding including dissection. Right carotid system: Mild atheromatous plaque at the bifurcation. No stenosis or ulceration. Left carotid system: Vessels are smoothly contoured and widely patent. Vertebral arteries: No proximal subclavian stenosis. Left dominant vertebral artery. Vessels are smoothly contoured and diffusely patent Skeleton: No acute finding Other neck: Unremarkable hardware traversing the neck. No acute finding Upper chest: Changes of recent median sternotomy with chest tubes and small bilateral pneumothorax. Review of the MIP images confirms the above findings CTA HEAD FINDINGS Anterior circulation: Right M1 occlusion with luminal clot appearance. There is some downstream branch reconstitution with underfilling. No additional embolus is noted. Negative for aneurysm. Posterior circulation: Strong left vertebral artery dominance. The right vertebral artery ends in the PICA. Fetal type right PCA flow. No branch occlusion, beading, or aneurysm Venous sinuses: Diffusely patent Anatomic variants: None significant Review of the MIP images confirms the above findings CT Brain Perfusion Findings: ASPECTS: 7 CBF (<30%) Volume: 12mL Perfusion (Tmax>6.0s) volume: Mismatch Volume: 90mL Infarction Location:In the right MCA distribution, especially at the basal ganglia. The core infarct is somewhat underestimated relative to the aspects score especially at the temporal  cortex. Neurology is already aware of the findings as confirmed in epic chat. IMPRESSION: 1. 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 2. Core infarct of 12 cc (somewhat underestimated compared to aspects) with 90 cc of penumbra. 3. Mild atherosclerosis. Electronically Signed   By: Tiburcio Pea M.D.   On: 08/30/2022 06:24   CT HEAD CODE STROKE WO CONTRAST  Result Date: 08/30/2022 CLINICAL DATA:  Code stroke.  Left-sided weakness EXAM: CT HEAD WITHOUT CONTRAST TECHNIQUE: Contiguous axial images were obtained from the base of the skull through the vertex without intravenous contrast. RADIATION DOSE REDUCTION: This exam was performed according to the departmental dose-optimization program which includes automated exposure control, adjustment of the mA and/or kV according to patient size and/or use of iterative reconstruction technique. COMPARISON:  07/11/2022 FINDINGS: Brain: Cytotoxic edema in the lateral right temporal cortex, putamen, and caudate head. No acute hemorrhage. No hydrocephalus or masslike finding. Vascular: No hyperdense vessel or unexpected calcification. Skull: Normal. Negative for fracture or focal lesion. Sinuses/Orbits: No acute finding. Other: These results were communicated to Dr. Derry Lory at 5:37 am on 08/30/2022 by epic chat. ASPECTS [facility] Stroke Program Early CT Score) - Ganglionic level infarction (caudate, lentiform nuclei, internal capsule, insula, M1-M3 cortex): 4 - Supraganglionic infarction (M4-M6 cortex): 3 Total score (0-10 with 10 being normal): 7 IMPRESSION: 1. 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 ASPECTS is 7. 2. No acute hemorrhage. Electronically Signed   By: Tiburcio Pea M.D.   On: 08/30/2022 05:38   ECHO INTRAOPERATIVE TEE  Result Date: 08/29/2022  *INTRAOPERATIVE TRANSESOPHAGEAL REPORT *  Patient Name:   Charles Holmes Date of Exam: 08/29/2022 Medical Rec #:  [id number]  Height:       67.0 in Accession #:     [id number]      Weight:       169.3 lb Date of Birth:  May 17, 1965       BSA:          1.88 m Patient Age:    57 years        BP:           140/50 mmHg Patient Gender: M               HR:           75 bpm. Exam Location:  Inpatient Transesophogeal exam was perform intraoperatively during surgical procedure. Patient was closely monitored under general anesthesia during the entirety of examination. Indications:     Cardiac surgery, Left ventricular assist device placement. Performing Phys: [id number] PETER VANTRIGT Complications: No known complications during this procedure. POST-OP IMPRESSIONS _ Right Ventricle: moderately reduced function. _ Aorta: The aorta appears unchanged from pre-bypass. _ Left Atrial Appendage: The left atrial appendage appears unchanged from pre-bypass. _ Aortic Valve: There is mild aortic insufficiency post bypass. The aortic valve is not visualized opening. _ Mitral Valve: There is Mild to moderate regurgitation. _ Tricuspid Valve: Moderate regurgitation post repair. _ Pulmonic Valve: The pulmonic valve appears unchanged from pre-bypass. _ Pericardium: The pericardium appears unchanged from pre-bypass. _ Comments: Following placement of left ventricular assist device, left ventricular function remains severely reduced. Right ventricular function is moderately reduced. There is moderate to severe tricuspid regurgitation. There is thickening of the tissue surrounding the circumflex artery visualized near the AV groove. The thickened tissue was seen collapsing into the left atrium when the left ventricle was underfilled, causing woresening LV filling and reduction in VAD flows. Prior to leaving OR, mitral valve appeared normal in structure. There was slight improvement in MR. The inflow to the left ventricle remained patent, with laminar flow, despite some invagination of thickened tissue into the left atrium. PRE-OP FINDINGS  Left Ventricle: The left ventricle has severely reduced systolic function,  with an ejection fraction of 20-30% 15%. The cavity size was severely dilated. There is no increase in left ventricular wall thickness. Left ventricular diffuse hypokinesis. Right Ventricle: The right ventricle has mildly reduced systolic function. The cavity was normal. There is no increase in right ventricular wall thickness. Left Atrium: Left atrial size was dilated. No left atrial/left atrial appendage thrombus was detected. The left atrial appendage is well visualized and there is no evidence of thrombus present. Right Atrium: Right atrial size was normal in size. Interatrial Septum: No atrial level shunt detected by color flow Doppler. Agitated saline contrast bubble study was negative, with no evidence of any interatrial shunt. There is no evidence of a patent foramen ovale. Pericardium: There is no evidence of pericardial effusion. There is no pleural effusion. Mitral Valve: The mitral valve is normal in structure. Mitral valve regurgitation is moderate by color flow Doppler. The MR jet is centrally-directed. Tricuspid Valve: The tricuspid valve was normal in structure. Tricuspid valve regurgitation is moderate by color flow Doppler. The jet is directed centrally. Aortic Valve: The aortic valve is normal in structure. Aortic valve regurgitation is mild by color flow Doppler. The jet is centrally-directed. There is no stenosis of the aortic valve, with a calculated valve area of 2.02 cm. Pulmonic Valve: The pulmonic valve was normal in structure. Pulmonic valve regurgitation is trivial by color flow Doppler. Aorta: The aortic root, ascending aorta and  aortic arch are normal in size and structure. Shunts: There is no evidence of an atrial septal defect. +--------------+--------++ LEFT VENTRICLE         +--------------+--------++ PLAX 2D                +--------------+--------++ LVOT diam:    2.30 cm  +--------------+--------++ LVOT Area:    4.15 cm +--------------+--------++                         +--------------+--------++ +------------------+-----------++ AORTIC VALVE                  +------------------+-----------++ AV Area (Vmax):   2.16 cm    +------------------+-----------++ AV Area (Vmean):  2.04 cm    +------------------+-----------++ AV Area (VTI):    2.02 cm    +------------------+-----------++ AV Vmax:          93.20 cm/s  +------------------+-----------++ AV Vmean:         62.200 cm/s +------------------+-----------++ AV VTI:           0.138 m     +------------------+-----------++ AV Peak Grad:     3.5 mmHg    +------------------+-----------++ AV Mean Grad:     2.0 mmHg    +------------------+-----------++ LVOT Vmax:        48.40 cm/s  +------------------+-----------++ LVOT Vmean:       30.600 cm/s +------------------+-----------++ LVOT VTI:         0.067 m     +------------------+-----------++ LVOT/AV VTI ratio:0.49        +------------------+-----------++  +------------+-------++ AORTA               +------------+-------++ Ao STJ diam:2.8 cm  +------------+-------++ Ao Asc diam:3.00 cm +------------+-------++ +---------------+-----------++ TRICUSPID VALVE            +---------------+-----------++ TR Peak grad:  36.5 mmHg   +---------------+-----------++ TR Vmax:       302.00 cm/s +---------------+-----------++  +--------------+-------+ SHUNTS                +--------------+-------+ Systemic VTI: 0.07 m  +--------------+-------+ Systemic Diam:2.30 cm +--------------+-------+  Anice Paganini Electronically signed by Anice Paganini Signature Date/Time: 08/29/2022/4:09:24 PM    Final    DG Chest Port 1 View  Result Date: 08/29/2022 CLINICAL DATA:  LVAD placement. EXAM: PORTABLE CHEST 1 VIEW COMPARISON:  Chest radiograph dated 08/28/2022. FINDINGS: Endotracheal tube with tip approximately 5 cm above the carina. Right IJ Swan-Ganz with tip over the mediastinum likely in the proximal right main pulmonary artery.  Interval removal of the right-sided PICC. There has been placement of a left ventricular assist device. Inferiorly approached drainage catheters noted. Left pectoral AICD device. Median sternotomy wires and stable cardiomegaly. Left mid lung field atelectasis. No pleural effusion or pneumothorax. IMPRESSION: 1. Support apparatus as above. 2. Left mid lung field atelectasis. Electronically Signed   By: Elgie Collard M.D.   On: 08/29/2022 16:08   EP STUDY  Result Date: 08/29/2022 See surgical note for result.  DG CHEST PORT 1 VIEW  Result Date: 08/28/2022 CLINICAL DATA:  Cardiogenic shock. EXAM: PORTABLE CHEST 1 VIEW COMPARISON:  Chest x-ray dated August 28, 2022 FINDINGS: Unchanged enlargement of the cardiac contours. Interval removal of intra-aortic balloon pump. Unchanged position of right IJ approach PA catheter and right arm PICC. Left chest wall pacer with unchanged lead positioning. Lungs are clear. No evidence of pleural effusion or pneumothorax. Old right-sided rib fractures. IMPRESSION: 1. Interval removal of intra-aortic balloon pump. 2. Stable support  devices. 3. No new airspace opacity. Electronically Signed   By: Allegra Lai M.D.   On: 08/28/2022 13:26   DG Chest Port 1 View  Result Date: 08/28/2022 CLINICAL DATA:  Congestive heart failure. EXAM: PORTABLE CHEST 1 VIEW COMPARISON:  08/27/2022 FINDINGS: Stable cardiomegaly. Intra-aortic balloon pump tip is stable in the region of the proximal descending thoracic aorta. Again noted is a left chest ICD. Right jugular central line with pulmonary artery catheter. Pulmonary artery catheter is in the region of the distal right pulmonary artery. Right arm PICC line appears to terminate in the SVC. Negative for pneumothorax. Slightly improved aeration at the right lung base may represent decreased atelectasis. No overt pulmonary edema. Old right rib fractures. IMPRESSION: 1. Stable lines and devices. 2. Slightly improved aeration at the right  lung base. No overt pulmonary edema. 3. Stable cardiomegaly. Electronically Signed   By: Richarda Overlie M.D.   On: 08/28/2022 09:17    Labs:  CBC: Recent Labs    08/29/22 2215 08/30/22 0258 08/30/22 0304 08/30/22 1657 08/30/22 1707 08/31/22 0310 08/31/22 0329  WBC 11.0* 11.6*  --  12.1*  --  13.7*  --   HGB 9.5*  9.0* 9.0*   < > 8.4* 8.8* 7.9* 8.2*  HCT 28.0*  28.2* 27.3*   < > 25.9* 26.0* 24.8* 24.0*  PLT 163 167  --  144*  --  139*  --    < > = values in this interval not displayed.    COAGS: Recent Labs    08/28/22 0923 08/29/22 1535 08/30/22 0258 08/31/22 0310  INR 1.1 1.4* 1.4* 1.6*  APTT 72* 34 34 35    BMP: Recent Labs    08/29/22 2215 08/30/22 0258 08/30/22 0304 08/30/22 1657 08/30/22 1707 08/31/22 0310 08/31/22 0329  NA 135  132* 133*   < > 135 135 132* 135  K 4.5  4.4 4.8   < > 4.4 4.4 4.0 4.1  CL 101 103  --  103  --  103  --   CO2 21* 23  --  22  --  23  --   GLUCOSE 137* 118*  --  128*  --  119*  --   BUN 11 11  --  9  --  9  --   CALCIUM 7.7* 8.0*  --  8.0*  --  8.0*  --   CREATININE 0.90 0.84  --  0.85  --  0.88  --   GFRNONAA >60 >60  --  >60  --  >60  --    < > = values in this interval not displayed.    LIVER FUNCTION TESTS: Recent Labs    08/10/22 1142 08/25/22 0900 08/27/22 0446 08/30/22 0258 08/31/22 0310  BILITOT 3.4*  --  1.0 4.4* 4.6*  AST 61*  --  27 218* 96*  ALT 77*  --  38 45* 30  ALKPHOS 77  --  73 46 41  PROT 6.9  --  5.3* 5.1* 4.9*  ALBUMIN 3.8 3.3* 2.9* 3.2* 2.7*    Assessment and Plan:  CVA R MCA revascularization in NIR 08/30/22 Plan per Stroke team  Electronically Signed: Robet Leu, PA-C 08/31/2022, 9:43 AM   I spent a total of 15 Minutes at the the patient's bedside AND on the patient's hospital floor or unit, greater than 50% of which was counseling/coordinating care for R MCA revascularization

## 2022-08-31 NOTE — Progress Notes (Signed)
RT weaned nitric dose from 5.0 ppm to 3.0pmm per MD. Pt tolerating well, vitals stable, RN aware,MD aware, RT will monitor as needed.    08/31/22 1145  INOMAX  NO2 (ppm) 0 ppm  NO (ppm) (S)  3 ppm  Oxygen (%) 46.3 %  Tank PSI 1750 PSI (tank [id number])

## 2022-08-31 NOTE — Progress Notes (Signed)
CSW met with patient at bedside. Patient was extubated only a few minutes earlier and was talking with CSW and reading information off the wall. Patient appeared to be making progress post extubation. CSW provided support and will continue to follow throughout implant hospitalization. Lasandra Beech, LCSW, CCSW-MCS [phone]

## 2022-08-31 NOTE — Procedures (Addendum)
Extubation Procedure Note  Patient Details:   Name: Charles Holmes DOB: 07-Sep-1965 MRN: [id number]   Airway Documentation:    Vent end date: 08/31/22 Vent end time: 1345   Evaluation  O2 sats: stable throughout Complications: No apparent complications Patient did tolerate procedure well. Bilateral Breath Sounds: Clear, Diminished   Yes Pt was successfully extubated and transitioned to nox nasal cannula (5ppm). Audible cuffleak was heard prior extubation and volume loss was noted prior extubation. Pt NIF(-30cmH2O) and VC (1L)in acceptable range of extubation. Pt currently stable at this time on 8L [place].   Jolayne Panther 08/31/2022, 2:00 PM

## 2022-08-31 NOTE — Progress Notes (Addendum)
Inhaled Nitric Oxide Wean  iNO (ppm) 10 >> 5 5 >> 4 4 >> 3 3 >> 2 2 >> 1 1 >> off off >> 1 1 >> off off >> 1   pre post pre post pre post pre post pre post pre post pre post pre post pre post  time 20:09 20:18 21:22 21:38 22:32 22:39 00:15 00:21 01:15 01:37 02:21 02:23 " 02:25 03:43 03:45 " 03:46  Flow 4.3 4.4 4.1 4.3 4.3 4.3 4.0 3.9 3.9 3.9 3.7 3.2 " 3.7 3.8 3.3 " 3.8  CVP 14 14 14 13 14 15 12 13 13 13 14 20  " 14 13 18  " 13  MAP 79 74 81 82 81 83 83 85 86 81  84 74 " 76 83 80 " 80   Summary: uneventful weaning until tried to cut iNO off from 1ppm. Attempted x2 unsuccessfully. Maintaining pt on 1ppm at this time.   Peyton Bottoms, RN 08/31/22

## 2022-08-31 NOTE — Progress Notes (Signed)
   Inpatient Rehab Admissions Coordinator :  Per therapy recommendations patient was screened for CIR candidacy by Ottie Glazier RN MSN. Patient is not yet at a level to tolerate the intensity required to pursue a CIR admit. Just extubated and bed level evals. The CIR admissions team will follow and monitor for progress and place a Rehab Consult order if felt to be appropriate. Please contact me with any questions.  Ottie Glazier RN MSN Admissions Coordinator [phone]

## 2022-08-31 NOTE — Progress Notes (Addendum)
STROKE TEAM PROGRESS NOTE   BRIEF HPI Charles Holmes is a 57 y.o. 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 He was noted to be moving all extremities and lift hem up of the bed around midnight. As sedation was being weaned in anticipation of eventual extubation in AM, he was noted to not be able to move his LUE and LLE. A code stroke was activated. R MCA M1 occlusion and received manual thrombectomy.    SIGNIFICANT HOSPITAL EVENTS 7/25: ED to Hospital admission 8/13: LVAD placement  INTERIM HISTORY/SUBJECTIVE Pt is less sedated today and more able to respond. He cannot express concerns but  cooperates in a more thorough neurologic exam.  CT head this morning shows right basal ganglia and intraventricular hemorrhage but no hydrocephalus or midline shift  OBJECTIVE  CBC    Component Value Date/Time   WBC 13.7 (H) 08/31/2022 0310   RBC 2.48 (L) 08/31/2022 0310   HGB 9.5 (L) 08/31/2022 1504   HGB 14.3 05/31/2021 0906   HCT 28.0 (L) 08/31/2022 1504   HCT 42.6 05/31/2021 0906   PLT 139 (L) 08/31/2022 0310   PLT 203 05/31/2021 0906   MCV 100.0 08/31/2022 0310   MCV 98 (H) 05/31/2021 0906   MCH 31.9 08/31/2022 0310   MCHC 31.9 08/31/2022 0310   RDW 17.5 (H) 08/31/2022 0310   RDW 15.8 (H) 05/31/2021 0906   LYMPHSABS 0.3 (L) 08/31/2022 0310   LYMPHSABS 0.9 05/31/2021 0906   MONOABS 2.2 (H) 08/31/2022 0310   EOSABS 0.0 08/31/2022 0310   EOSABS 0.1 05/31/2021 0906   BASOSABS 0.0 08/31/2022 0310   BASOSABS 0.0 05/31/2021 0906    BMET    Component Value Date/Time   NA 134 (L) 08/31/2022 1504   NA 143 05/31/2021 0906   K 4.2 08/31/2022 1504   CL 103 08/31/2022 0310   CO2 23 08/31/2022 0310   GLUCOSE 119 (H) 08/31/2022 0310   BUN 9 08/31/2022 0310   BUN 12 05/31/2021 0906   CREATININE 0.88 08/31/2022 0310   CALCIUM 8.0  (L) 08/31/2022 0310   EGFR 80 05/31/2021 0906   GFRNONAA >60 08/31/2022 0310    IMAGING past 24 hours DG Chest Port 1 View  Result Date: 08/31/2022 CLINICAL DATA:  Left ventricular assist device.  Follow-. EXAM: PORTABLE CHEST 1 VIEW COMPARISON:  Radiographs 08/30/2022 and 08/29/2022.  CT 08/11/2022. FINDINGS: 0558 hours. The endotracheal tube, enteric tube, right IJ Swan-Ganz catheter, bilateral chest tubes, left subclavian AICD leads and the left ventricular assist device appear unchanged in position. There is stable cardiomegaly post median sternotomy. Little change in the appearance of the lungs with mild bibasilar atelectasis and vascular congestion. No pneumothorax or acute osseous abnormality identified. There are old right-sided rib fractures. IMPRESSION: Stable postoperative chest. Electronically Signed   By: Carey Bullocks M.D.   On: 08/31/2022 10:30   CT HEAD WO CONTRAST ( )  Result Date: 08/31/2022 CLINICAL DATA:  Stroke follow-up EXAM: CT HEAD WITHOUT CONTRAST TECHNIQUE: Contiguous axial images were obtained from the base of the skull through the vertex without intravenous contrast. RADIATION DOSE REDUCTION: This exam was performed according to the departmental dose-optimization program which includes automated exposure control, adjustment of the mA and/or kV according to patient size and/or use of iterative reconstruction technique. COMPARISON:  Yesterday FINDINGS: Brain: Hematoma at the right caudate head with mild intraventricular extension  is unchanged. Hazy high density at the right putamen show some fading, residual attributed to petechial blood. The extent of acute infarct in the right temporal cortex and right basal ganglia appears non progressed. No hydrocephalus. Vascular: No hyperdense vessel or unexpected calcification. Skull: Normal. Negative for fracture or focal lesion. Sinuses/Orbits: No acute finding. IMPRESSION: Unchanged extent of infarct and hemorrhage in the right MCA  distribution including small volume intraventricular clot. No hydrocephalus. Electronically Signed   By: Tiburcio Pea M.D.   On: 08/31/2022 06:02   CT HEAD WO CONTRAST ( )  Result Date: 08/30/2022 CLINICAL DATA:  Stroke, follow up left sided weakness. EXAM: CT HEAD WITHOUT CONTRAST TECHNIQUE: Contiguous axial images were obtained from the base of the skull through the vertex without intravenous contrast. RADIATION DOSE REDUCTION: This exam was performed according to the departmental dose-optimization program which includes automated exposure control, adjustment of the mA and/or kV according to patient size and/or use of iterative reconstruction technique. COMPARISON:  Head CT 08/30/2022. FINDINGS: Brain: Hemorrhagic transformation of evolving infarct in the right caudate nucleus with small volume intraventricular extension of hemorrhage. No acute hydrocephalus. New hyperdensity within the lentiform nucleus may reflect petechial hemorrhage versus contrast staining. Surrounding hypoattenuation in the right internal capsule and corona radiata is favored to reflect evolving infarct. Unchanged old infarct in the right anterior temporal lobe. No extra-axial collection, significant mass effect or midline shift. Vascular: No hyperdense vessel or unexpected calcification. Skull: No calvarial fracture or suspicious bone lesion. Skull base is unremarkable. Sinuses/Orbits: No acute findings. Other: None. IMPRESSION: 1. Hemorrhagic transformation of evolving infarct in the right caudate nucleus with small volume intraventricular extension of hemorrhage. No acute hydrocephalus. 2. New hyperdensity within the right lentiform nucleus may reflect petechial hemorrhage versus contrast staining. 3. Surrounding hypoattenuation in the right internal capsule and corona radiata is favored to reflect evolving infarct. Critical Value/emergent results were relayed via secure text page at the time of interpretation on 08/30/2022 at  4:32 pm to provider Dr. Selina Cooley, who acknowledged these results. Electronically Signed   By: Orvan Falconer M.D.   On: 08/30/2022 16:42    Vitals:   08/31/22 1300 08/31/22 1400 08/31/22 1500 08/31/22 1600  BP:      Pulse:  (!) 168 (!) 101 (!) 104  Resp: 14 13 12 16   Temp: 98.6 F (37 C) 98.4 F (36.9 C) 98.4 F (36.9 C) 98.4 F (36.9 C)  TempSrc: Core   Core  SpO2:  98% 99% 98%  Weight:      Height:         PHYSICAL EXAM General:  intubated.  CV: Regular rate and rhythm on monitor Respiratory:  synchronous with ventilator  NEURO:  Mental Status: intubated. Opens eyes to verbal command.  Speech/Language: responds to simple verbal commands. Otherwise cannot assess.   Cranial Nerves:  II: PERRL. No eye blink reflex with L threatening field confrontation.   III, IV, VI: EOMI. Eyelid elevate symmetrically.   V: unable to assess.  VII: Face is symmetrical resting and smiling.  VIII: unable to assess.  IX, X: unable to assess.  XI: unable to assess.  XII: tongue midline.  Motor: RUE and RLE move to painful stimulus. LUE and LLE weak compared to R but able to lift partially off the bed Tone: is normal and bulk is normal Sensation- unable to assess.  Coordination: unable to assess.   Gait- unable to assess.    ASSESSMENT/PLAN  Acute Ischemic Infarct: Due to right M1 occlusion status  post mechanical thrombectomy with resultant right temporal cortex and basal ganglia infarct with hemorrhagic transformation   Etiology:  cardioembolic status post LVAD procedure Code Stroke CT head Acute infarct R temporal cortex and basal ganglia. No acute hemorrhage. ASPECTS 7.    CTA head & neck emergent M1 occlusion.  Repeat CT head unchanged extent of hemorrhage in right MCA distribution Echo 8/11 < 20% severely dilated LV RV mildly decreased.  LDL 51 HgbA1c 5.5 VTE prophylaxis - SCDs Eliquis (apixaban) daily prior to admission, holding anticoagulation for 48 hours due to persistent  cerebral hemorrhage Therapy recommendations:  no PT f/u Disposition:  pending  Hx of Stroke/TIA June 2024: R MCA infarct with M1 occlusion s/p TPA and IR with TICI3, likely due to cardiomyopathy with low EF  Atrial fibrillation Home Meds: eliquis 5 mg twice daily, carvedilol 3.125 mg twice daily Continue telemetry monitoring Holding anticoagulation for 48 hours and per negative CT for hemorrhage.   Hypertension & HFrEF Home meds:  carvedilol 3.125 mg twice daily, furosemide 40 mg twice daily, spironolactone 12.5 mg once daily Blood Pressure Goal: SBP 120-160 for first 24 hours then less than 180   Hyperlipidemia Home meds:  atorvastatin 80 mg once daily, resumed in hospital LDL 51, goal < 70 Continue statin at discharge  Diabetes type II Controlled Home meds:  farxiga 10 mg once daily HgbA1c 5.5, goal < 7.0 Recommend close follow-up with PCP for better DM control  Dysphagia Patient has post-stroke dysphagia, SLP consulted    Diet   Diet NPO time specified   Advance diet as tolerated  Other Stroke Risk Factors ETOH use, advised to drink no more than 2 drink(s) a day Coronary artery disease Congestive heart failure   Other Active Problems Acute exacerbation HFrEF managed by cardiology Acute respiratory insufficiency, intubated managed by [facility] day # 21  Meryl Dare, MD PGY-1 Psychiatry Resident 08/31/2022, 4:16 PM  STROKE MD NOTE :    Patient underwent LVAD placement and subsequently developed neurological worsening with recurrent right M1 occlusion and was treated with successful mechanical thrombectomy but remains on sedation and vasopressor support for his hemodynamic instability.  Neurological exam suggests left hemiparesis.  Recommend repeat CT scan shows significant hemorrhagic transformation hence recommend holding off for IV heparin at least for 48 hours and repeat CT head on 09/02/2022   AM to decide on resuming anticoagulation to prevent further  thromboembolism from his LVAD device.  Wean off ventilatory support and vasopressors as per primary cardiology team.  Long discussion with   Dr. Gala Romney and heart failure team.  This patient is critically ill and at significant risk of neurological worsening, death and care requires constant monitoring of vital signs, hemodynamics,respiratory and cardiac monitoring, extensive review of multiple databases, frequent neurological assessment, discussion with family, other specialists and medical decision making of high complexity.I have made any additions or clarifications directly to the above note.This critical care time does not reflect procedure time, or teaching time or supervisory time of PA/NP/Med Resident etc but could involve care discussion time.  I spent 30 minutes of neurocritical care time  in the care of  this patient.         Delia Heady, MD Medical Director [facility] Pager: [phone] 08/31/2022 4:16 PM   To contact Stroke Continuity provider, please refer to [email or web address]. After hours, contact General Neurology

## 2022-08-31 NOTE — Progress Notes (Signed)
RT weaned nitric dose from 10.0ppm to 5.0pmm per MD. Pt tolerating well, vitals stable, RN aware,MD aware, RT will monitor as needed.     08/31/22 1030  INOMAX  NO2 (ppm) 0 ppm  NO (ppm) (S)  5 ppm  Oxygen (%) 46.6 %  Tank PSI 1750 PSI (1800 tank 2)

## 2022-08-31 NOTE — CV Procedure (Signed)
Radial arterial line placement.    Emergent consent assumed. The right wrist was prepped and draped in the routine sterile fashion a single lumen radial arterial catheter was placed in the right radial artery using a modified Seldinger technique and ultrasound guidance. Good blood flow and wave forms. A dressing was placed.     Arvilla Meres, MD  12:34 PM

## 2022-08-31 NOTE — Progress Notes (Signed)
Pt placed on nitric dose of 5ppm delivered via Green per MD. Pt tolerating well, vital stable, hemodynamics stable, RN at bedside, RT will monitor as needed.    08/31/22 1346  INOMAX  NO2 (ppm) 0 ppm  NO (ppm) (S)  5 ppm  Oxygen (%) 23.1 %  Tank PSI 1720 PSI (tank 2 1800)

## 2022-09-01 ENCOUNTER — Inpatient Hospital Stay (HOSPITAL_COMMUNITY): Payer: Medicare Other

## 2022-09-01 DIAGNOSIS — I5023 Acute on chronic systolic (congestive) heart failure: Secondary | ICD-10-CM | POA: Diagnosis not present

## 2022-09-01 DIAGNOSIS — I63411 Cerebral infarction due to embolism of right middle cerebral artery: Secondary | ICD-10-CM

## 2022-09-01 DIAGNOSIS — I509 Heart failure, unspecified: Secondary | ICD-10-CM | POA: Diagnosis not present

## 2022-09-01 DIAGNOSIS — Z95811 Presence of heart assist device: Secondary | ICD-10-CM | POA: Diagnosis not present

## 2022-09-01 DIAGNOSIS — I428 Other cardiomyopathies: Secondary | ICD-10-CM | POA: Diagnosis not present

## 2022-09-01 LAB — GLUCOSE, CAPILLARY
Glucose-Capillary: 101 mg/dL — ABNORMAL HIGH (ref 70–99)
Glucose-Capillary: 100 mg/dL — ABNORMAL HIGH (ref 70–99)
Glucose-Capillary: 110 mg/dL — ABNORMAL HIGH (ref 70–99)
Glucose-Capillary: 128 mg/dL — ABNORMAL HIGH (ref 70–99)
Glucose-Capillary: 134 mg/dL — ABNORMAL HIGH (ref 70–99)
Glucose-Capillary: 127 mg/dL — ABNORMAL HIGH (ref 70–99)
Glucose-Capillary: 143 mg/dL — ABNORMAL HIGH (ref 70–99)

## 2022-09-01 LAB — COOXEMETRY PANEL
Carboxyhemoglobin: 1.9 % — ABNORMAL HIGH (ref 0.5–1.5)
Carboxyhemoglobin: 2.3 % — ABNORMAL HIGH (ref 0.5–1.5)
Methemoglobin: <0.7 % (ref 0.0–1.5)
Methemoglobin: <0.7 % (ref 0.0–1.5)
O2 Saturation: 62.2 %
O2 Saturation: 83.3 %
Total hemoglobin: 9.4 g/dL — ABNORMAL LOW (ref 12.0–16.0)
Total hemoglobin: 9.1 g/dL — ABNORMAL LOW (ref 12.0–16.0)

## 2022-09-01 LAB — PHOSPHORUS
Phosphorus: 3.1 mg/dL (ref 2.5–4.6)
Phosphorus: 2.7 mg/dL (ref 2.5–4.6)

## 2022-09-01 LAB — MAGNESIUM
Magnesium: 2.3 mg/dL (ref 1.7–2.4)
Magnesium: 2.2 mg/dL (ref 1.7–2.4)

## 2022-09-01 LAB — CBC
HCT: 30 % — ABNORMAL LOW (ref 39.0–52.0)
Hemoglobin: 9.5 g/dL — ABNORMAL LOW (ref 13.0–17.0)
MCH: 31.5 pg (ref 26.0–34.0)
MCHC: 31.7 g/dL (ref 30.0–36.0)
MCV: 99.3 fL (ref 80.0–100.0)
Platelets: 174 10*3/uL (ref 150–400)
RBC: 3.02 MIL/uL — ABNORMAL LOW (ref 4.22–5.81)
RDW: 18.9 % — ABNORMAL HIGH (ref 11.5–15.5)
WBC: 19.6 10*3/uL — ABNORMAL HIGH (ref 4.0–10.5)
nRBC: 0.1 % (ref 0.0–0.2)

## 2022-09-01 LAB — COMPREHENSIVE METABOLIC PANEL
ALT: 23 U/L (ref 0–44)
AST: 73 U/L — ABNORMAL HIGH (ref 15–41)
Albumin: 2.7 g/dL — ABNORMAL LOW (ref 3.5–5.0)
Alkaline Phosphatase: 58 U/L (ref 38–126)
Anion gap: 8 (ref 5–15)
BUN: 17 mg/dL (ref 6–20)
CO2: 22 mmol/L (ref 22–32)
Calcium: 8.4 mg/dL — ABNORMAL LOW (ref 8.9–10.3)
Chloride: 104 mmol/L (ref 98–111)
Creatinine, Ser: 0.74 mg/dL (ref 0.61–1.24)
GFR, Estimated: >60 mL/min (ref >60)
Glucose, Bld: 112 mg/dL — ABNORMAL HIGH (ref 70–99)
Potassium: 4.4 mmol/L (ref 3.5–5.1)
Sodium: 134 mmol/L — ABNORMAL LOW (ref 135–145)
Total Bilirubin: 5.1 mg/dL — ABNORMAL HIGH (ref 0.3–1.2)
Total Protein: 5.3 g/dL — ABNORMAL LOW (ref 6.5–8.1)

## 2022-09-01 LAB — PROTIME-INR
INR: 1.5 — ABNORMAL HIGH (ref 0.8–1.2)
Prothrombin Time: 18.5 s — ABNORMAL HIGH (ref 11.4–15.2)

## 2022-09-01 LAB — APTT: aPTT: 34 s (ref 24–36)

## 2022-09-01 LAB — LACTATE DEHYDROGENASE: LDH: 465 U/L — ABNORMAL HIGH (ref 98–192)

## 2022-09-01 LAB — PROCALCITONIN: Procalcitonin: 0.42 ng/mL

## 2022-09-01 MED ORDER — POLYETHYLENE GLYCOL 3350 17 G PO PACK
17.0000 g | PACK | Freq: Every day | ORAL | Status: DC
  Administered 2022-09-01 – 2022-09-04 (×3): 17 g
  Filled 2022-09-01 (×3): qty 1

## 2022-09-01 MED ORDER — INSULIN ASPART 100 UNIT/ML IJ SOLN
0.0000 [IU] | Freq: Four times a day (QID) | INTRAMUSCULAR | Status: DC
  Administered 2022-09-01 – 2022-09-02 (×4): 2 [IU] via SUBCUTANEOUS
  Administered 2022-09-02: 4 [IU] via SUBCUTANEOUS
  Administered 2022-09-02: 2 [IU] via SUBCUTANEOUS
  Administered 2022-09-03: 4 [IU] via SUBCUTANEOUS
  Administered 2022-09-03 (×3): 2 [IU] via SUBCUTANEOUS
  Administered 2022-09-04: 4 [IU] via SUBCUTANEOUS
  Administered 2022-09-04 (×3): 2 [IU] via SUBCUTANEOUS
  Administered 2022-09-05 (×3): 4 [IU] via SUBCUTANEOUS
  Administered 2022-09-05: 2 [IU] via SUBCUTANEOUS
  Administered 2022-09-06: 4 [IU] via SUBCUTANEOUS
  Administered 2022-09-06 – 2022-09-07 (×3): 2 [IU] via SUBCUTANEOUS

## 2022-09-01 MED ORDER — PROSOURCE TF20 ENFIT COMPATIBL EN LIQD
60.0000 mL | Freq: Every day | ENTERAL | Status: DC
  Administered 2022-09-01 – 2022-09-05 (×5): 60 mL
  Filled 2022-09-01 (×5): qty 60

## 2022-09-01 MED ORDER — OXYCODONE HCL 5 MG PO TABS: 5.0000 mg | ORAL_TABLET | ORAL | Status: DC | PRN

## 2022-09-01 MED ORDER — MILRINONE LACTATE IN DEXTROSE 20-5 MG/100ML-% IV SOLN
0.3750 ug/kg/min | INTRAVENOUS | Status: DC
  Administered 2022-09-01 – 2022-09-12 (×25): 0.375 ug/kg/min via INTRAVENOUS
  Filled 2022-09-01 (×25): qty 100

## 2022-09-01 MED ORDER — HYDRALAZINE HCL 20 MG/ML IJ SOLN
10.0000 mg | INTRAMUSCULAR | Status: DC | PRN
  Administered 2022-09-01: 10 mg via INTRAVENOUS
  Filled 2022-09-01: qty 1

## 2022-09-01 MED ORDER — VITAL 1.5 CAL PO LIQD
1000.0000 mL | ORAL | Status: DC
  Administered 2022-09-01 – 2022-09-05 (×5): 1000 mL

## 2022-09-01 MED ORDER — VANCOMYCIN HCL 1500 MG/300ML IV SOLN
1500.0000 mg | Freq: Once | INTRAVENOUS | Status: AC
  Administered 2022-09-01: 1500 mg via INTRAVENOUS
  Filled 2022-09-01: qty 300

## 2022-09-01 MED ORDER — SODIUM CHLORIDE 0.9 % IV SOLN
2.0000 g | Freq: Three times a day (TID) | INTRAVENOUS | Status: DC
  Administered 2022-09-01 – 2022-09-05 (×12): 2 g via INTRAVENOUS
  Filled 2022-09-01 (×13): qty 12.5

## 2022-09-01 MED ORDER — VANCOMYCIN HCL IN DEXTROSE 1-5 GM/200ML-% IV SOLN
1000.0000 mg | Freq: Two times a day (BID) | INTRAVENOUS | Status: DC
  Administered 2022-09-01 – 2022-09-04 (×6): 1000 mg via INTRAVENOUS
  Filled 2022-09-01 (×7): qty 200

## 2022-09-01 NOTE — Progress Notes (Signed)
SLP Cancellation Note  Patient Details Name: Charles Holmes MRN: [id number] DOB: 06/15/1965   Cancelled treatment:        Attempted to see pt for FEES procedure to assess swallow function.  Pt with RD at bedside to place cortrak.  Discussed with RN and RD.  Pt is likely to need cortrak for nutritional support regardless of FEES outcome and presence of NG is unlikely to impact swallow function. RD to proceed with cortrak placement at this time.  SLP will return as schedule permits for instrumental swallow assessment.  Kerrie Pleasure, MA, CCC-SLP Acute Rehabilitation Services Office: [phone] 09/01/2022, 12:05 PM

## 2022-09-01 NOTE — Progress Notes (Addendum)
LVAD Coordinator Rounding Note:  Admitted 08/10/22 due to acute on chronic CHF with cardiogenic shock. Milrinone dependent. Advance therapy workup completed, and pt deemed acceptable VAD candidate. Dental extractions completed 8/6. IABP placed 08/25/22.  HM 3 LVAD implanted on 08/29/22 by Dr Donata Clay under destination therapy criteria. 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 Result pending.  7/25 Admit with cardiogenic shock. Started milrinone and NE. 8/6 S/P 13 teeth extractions  8/9 IABP placed 8/13 S/p HM III LVAD implant + clipping left atrial appendage d/t severe thickening and invagination of mitral valve annulus impeding flows  CT Head 8/14 (initial) 1. 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 ASPECTS is 7. 2. No acute hemorrhage.  CT Angio Head/Neck 1. 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 2. Core infarct of 12 cc (somewhat underestimated compared to aspects) with 90 cc of penumbra. 3. Mild atherosclerosis.  Pt taken emergently to IR for percutaneous right common carotid arteriogram with thrombectomy. Revascularization achieved. Angio-seal closure device applied to left groin- clean, dry, and intact.   CT Head 8/15 @ 0450 Unchanged extent of infarct and hemorrhage in the right MCA distribution including small volume intraventricular clot. No hydrocephalus.  Pt extubated yesterday. He is following commands this morning. His speech is garbled (and has been since extubation). Left grip and left LE movement weaker than right. Needs swallow eval today.   For repeat CT head on 8/17.  Remains on nitric oxide due to low VAD flow when stopped.    WBC 19.6 today. Blood, sputum, and urine cultures pending. Currently on Cefepime 2 g q 8 hrs and Vancomycin 1000 mg q 12 hrs.   Discussed dressing change need with [facility] nurse champion. She will plan to change dressing on Saturday and Sunday.   Vital signs: Temp: 99.1 HR:  103 Doppler Pressure: 96 Arterial BP: 88/73 (80) O2 Sat: 98% on 10 L HFNC Wt: 183.6>191.1>190.9 lbs   LVAD interrogation reveals:  Speed: 5300 Flow: 4.3 Power: 3.8 w PI: 4.3   Alarms: none Events:  4 PI today  Hematocrit: 20-DO NOT ADJUST Fixed speed: 5300 Low speed limit: 5000  Drive Line: Existing VAD dressing removed and site care performed using sterile technique. Drive line exit site cleaned with Chlora prep applicators x 2, allowed to dry, and silver strip with gauze applied. Exit site healing and unincorporated, the velour is fully implanted at exit site. Scant amount of serosanguinous/dried bloody drainage. No redness, tenderness, foul odor or rash noted. 2 suture in place. Drive line anchor secure. Continue daily dressing changes. Next dressing change due Thursday 09/02/22 by VAD coordinator or nurse champion only.     Labs:  LDH trend: 161>096>045  INR trend: 1.4>1.6  AST/ALT trend: 218/45>96/30>73/23  Total Bili trend: 4.4>4.6>5.1  WBC trend: 11.6>13.7>19.6  Anticoagulation Plan: -INR Goal: 2.0 - 2.5 -ASA Dose: 325 mg   Blood Products:  IntraOp 8/14: - 4 FFP - 2 Platelets - 2 PRBC - 1 cyro - 449 cc of cellsaver - DDAVP 20 mcg x 1   Device: [employer]onic BiV -  -Therapies: OFF  Arrythmias:   Respiratory: Intubated. Nitric 1 ppm.   Infection:  09/01/22>> sputum cx>> pending 09/01/22>> blood cx>> pending 09/01/22>> urine cx>> pending  Renal:  -BUN/CRT: 11/0.84  Adverse Events on VAD: - 08/30/22: - Developed left sided weakness this am. CTA with R MCA infarct. Taken to IR for thrombectomy   Drips:  Epinephrine 3 mcg/min Milrinone 0.375 mcg/kg/min  Patient Education: Discussed and demonstrated power source change with pt. Left extremity too weak at this time to perform power change on his own.  No family at bedside.   Plan/Recommendations:  Please page VAD coordinator for any alarms or VAD equipment issues. Daily dressing changes by VAD  coordinator or Nurse Dierdre Forth RN VAD Coordinator  Office: [phone]  24/7 Pager: [phone]

## 2022-09-01 NOTE — Progress Notes (Signed)
Nutrition Follow-up  DOCUMENTATION CODES:   Not applicable  INTERVENTION:   Tube Feeding via Cortrak:  Vital 1.5 at 60 ml/hr Begin TF at rate of 20 ml/hr; titrate by 10 mL q 8 hours until goal rate of 60 ml/hr Pro-Source TF20 60 mL daily TF provides 2240 kcals, 117 g of protein and  1094 mL of free water  Further recommendations pending ability to advance diet  NUTRITION DIAGNOSIS:   Inadequate oral intake related to catabolic illness, poor appetite as evidenced by per patient/family report.  Being addressed via TF  GOAL:   Patient will meet greater than or equal to 90% of their needs  Progressing  MONITOR:   PO intake, Supplement acceptance, Labs, Weight trends  REASON FOR ASSESSMENT:   Consult LVAD Eval  ASSESSMENT:   57 yo male admitted from cardiologist office with worsening fatigue, shortness of breath and poor appetite with acute on chronic HF with significant reduced LV dysfunction (EF 20%) and preserved RV function. Pt being evaluated for possible LVAD placement. PMH includes cardiac and pulmonary sarcoidosis, CAD, GERD, HLD, HTN. Noted recent hospitalization from 6/24-6/30 this year with acute emoblic CVA.  7/25 Admitted 7/26 LVAD work-up initiated 8/06 Dental extraction (13 teeth) 8/09 IABP placed 8/13 HM3 LVAD implanted, IABP removed 8/14 Early AM-Code Stroke, CT with acute R MCA stroke with R MCA M1 occlusion requiring thrombectomy 8/15 Extubated  Remains on HFNC, 10 L 50% FiO2 Pt currently NPO, failed bedside SLP eval today, FEES pending. Plan for Cortrak today  Epinephrine down to 3, remains on milrinone gtt  Last BM prior to surgery (8/12), absomen soft, BS present (hypoactive). Pt on bowel regimen and reglan  Labs: Creatinine wdl, sodium 134 (L), potassium wdl Meds: MVI with Minerals, ss novolog, dulcolax, reglan (post op x 20 doses)   Diet Order:   Diet Order             Diet NPO time specified  Diet effective now                    EDUCATION NEEDS:   Not appropriate for education at this time  Skin:  Skin Assessment: Skin Integrity Issues: Skin Integrity Issues:: Incisions Incisions: new LVAD-driveline  Last BM:  8/12  Height:   Ht Readings from Last 1 Encounters:  08/31/22 5\' 7"  (1.702 m)    Weight:   Wt Readings from Last 1 Encounters:  09/01/22 86.6 kg    BMI:  Body mass index is 29.9 kg/m.  Estimated Nutritional Needs:   Kcal:  2000-2300 kcals  Protein:  110-125 g  Fluid:  1.8 L    Romelle Starcher MS, RDN, LDN, CNSC Registered Dietitian 3 Clinical Nutrition RD Pager and On-Call Pager Number Located in [place]

## 2022-09-01 NOTE — Evaluation (Signed)
Clinical/Bedside Swallow Evaluation Patient Details  Name: Charles Holmes MRN: [id number] Date of Birth: 1965/07/14  Today's Date: 09/01/2022 Time: SLP Start Time (ACUTE ONLY): 0932 SLP Stop Time (ACUTE ONLY): 0939 SLP Time Calculation (min) (ACUTE ONLY): 7 min  Past Medical History:  Past Medical History:  Diagnosis Date   CAD (coronary artery disease)    CHF (congestive heart failure) (HCC)    GERD (gastroesophageal reflux disease)    Hyperlipidemia    Hypertension    Systolic heart failure (HCC) 2021   LVEF 18%, RVEF 38% on cardiac MRI 12/19/2019. possible cardiac sarcoidosis.   Wide-complex tachycardia 2021   wears LifeVest   Past Surgical History:  Past Surgical History:  Procedure Laterality Date   BIV UPGRADE N/A 06/07/2021   Procedure: BIV ICD UPGRADE;  Surgeon: Regan Lemming, MD;  Location: [facility] INVASIVE CV LAB;  Service: Cardiovascular;  Laterality: N/A;   CLIPPING OF ATRIAL APPENDAGE Left 08/29/2022   Procedure: CLIPPING OF ATRIAL APPENDAGE;  Surgeon: Lovett Sox, MD;  Location: [facility] OR;  Service: Open Heart Surgery;  Laterality: Left;   IABP INSERTION N/A 08/25/2022   Procedure: IABP Insertion;  Surgeon: Dolores Patty, MD;  Location: [facility] INVASIVE CV LAB;  Service: Cardiovascular;  Laterality: N/A;   ICD IMPLANT N/A 02/20/2020   Procedure: ICD IMPLANT;  Surgeon: Regan Lemming, MD;  Location: [facility] INVASIVE CV LAB;  Service: Cardiovascular;  Laterality: N/A;   INSERTION OF IMPLANTABLE LEFT VENTRICULAR ASSIST DEVICE N/A 08/29/2022   Procedure: INSERTION OF IMPLANTABLE LEFT VENTRICULAR ASSIST DEVICE;  Surgeon: Lovett Sox, MD;  Location: [facility] OR;  Service: Open Heart Surgery;  Laterality: N/A;   IR CT HEAD LTD  07/10/2022   IR CT HEAD LTD  08/30/2022   IR PERCUTANEOUS ART THROMBECTOMY/INFUSION INTRACRANIAL INC DIAG ANGIO  07/10/2022   IR PERCUTANEOUS ART THROMBECTOMY/INFUSION INTRACRANIAL INC DIAG ANGIO  08/30/2022   IR US GUIDE VASC ACCESS LEFT  08/30/2022   IR  US GUIDE VASC ACCESS RIGHT  07/10/2022   RADIOLOGY WITH ANESTHESIA N/A 07/10/2022   Procedure: RADIOLOGY WITH ANESTHESIA;  Surgeon: Radiologist, Medication, MD;  Location: [facility] OR;  Service: Radiology;  Laterality: N/A;   RADIOLOGY WITH ANESTHESIA N/A 08/30/2022   Procedure: IR WITH ANESTHESIA;  Surgeon: Julieanne Cotton, MD;  Location: [facility] OR;  Service: Radiology;  Laterality: N/A;   REMOVAL OF IMPELLA LEFT VENTRICULAR ASSIST DEVICE N/A 08/29/2022   Procedure: REMOVAL OF INTRA-AORTIC BALLON PUMP;  Surgeon: Lovett Sox, MD;  Location: [facility] OR;  Service: Open Heart Surgery;  Laterality: N/A;   RIGHT HEART CATH N/A 07/14/2022   Procedure: RIGHT HEART CATH;  Surgeon: Dolores Patty, MD;  Location: [facility] INVASIVE CV LAB;  Service: Cardiovascular;  Laterality: N/A;   RIGHT HEART CATH N/A 08/25/2022   Procedure: RIGHT HEART CATH;  Surgeon: Dolores Patty, MD;  Location: [facility] INVASIVE CV LAB;  Service: Cardiovascular;  Laterality: N/A;   RIGHT/LEFT HEART CATH AND CORONARY ANGIOGRAPHY N/A 12/16/2019   Procedure: RIGHT/LEFT HEART CATH AND CORONARY ANGIOGRAPHY;  Surgeon: Swaz[place], Peter M, MD;  Location: [facility] INVASIVE CV LAB;  Service: Cardiovascular;  Laterality: N/A;   RIGHT/LEFT HEART CATH AND CORONARY ANGIOGRAPHY N/A 08/10/2022   Procedure: RIGHT/LEFT HEART CATH AND CORONARY ANGIOGRAPHY;  Surgeon: Dolores Patty, MD;  Location: [facility] INVASIVE CV LAB;  Service: Cardiovascular;  Laterality: N/A;   TEE WITHOUT CARDIOVERSION N/A 08/29/2022   Procedure: TRANSESOPHAGEAL ECHOCARDIOGRAM;  Surgeon: Lovett Sox, MD;  Location: [facility] OR;  Service: Open Heart Surgery;  Laterality: N/A;  TOOTH EXTRACTION N/A 08/22/2022   Procedure: DENTAL RESTORATION/EXTRACTIONS;  Surgeon: Ocie Doyne, DMD;  Location: [facility] OR;  Service: Oral Surgery;  Laterality: N/A;   HPI:  Charles Holmes is 57 year old presented to [facility] on 08/10/22 for acute on chronic systolic heart failure. Multiple dental extractions 8/6. Pt had IABP placed on 08/25/22  for ongoing cardiogenic shock. Underwent placement of LVAD on 8/13. Developed lt sided weakness on 8/14. Rt MCA CVA. Underwent mechanical thrombectomy by IR. ETT 8/13-15.  PMH - CAD, CHF, HLD, HTN, systolic HF, ICD implant, rt CVA.  LVAD placement and new stroke R MCA M1.    Assessment / Plan / Recommendation  Clinical Impression  Pt presents with clinical indicators of pharyngeal dysphagia in setting of new R MCA CVA. Pt is known to this service with prior CVA in June of this year but without dysphagia and scored 27/30 on SLUMS during that admission.  Vocal quality clear after 2 day intubation and pt reports voice is at baseline. Pt exhibited immediate wet cough on 2nd of 2 trials of thin liquid.  Cough weak and uproductive and seemingly quite painful post surgery.  Pt tolerated puree without cliniclal s/s of aspiration and exhibited good oral clearance.  Pt declined trial of regular solid. Pt with multiple dental extractions this admission on 8/6 under general anesthesia and reports a soft mostly puree diet following that procedure.  Pt would benefit from instrumental assessment of pharyngeal swallow function prior to intiation of PO diet.  Pt cannot transport to radiology at this time while on nitric oxide will plan for FEES at bedside later this date. SLP Visit Diagnosis: Dysphagia, unspecified (R13.10)    Aspiration Risk  Moderate aspiration risk    Diet Recommendation NPO         Other  Recommendations Oral Care Recommendations: Oral care QID    Recommendations for follow up therapy are one component of a multi-disciplinary discharge planning process, led by the attending physician.  Recommendations may be updated based on patient status, additional functional criteria and insurance authorization.  Follow up Recommendations  (TBD)      Assistance Recommended at Discharge    Functional Status Assessment  (TBD)  Frequency and Duration  (TBD)          Prognosis Prognosis for improved  oropharyngeal function:  (TBD)      Swallow Study   General Date of Onset: 08/10/22 HPI: Charles Holmes is 57 year old presented to [facility] on 08/10/22 for acute on chronic systolic heart failure. Multiple dental extractions 8/6. Pt had IABP placed on 08/25/22 for ongoing cardiogenic shock. Underwent placement of LVAD on 8/13. Developed lt sided weakness on 8/14. Rt MCA CVA. Underwent mechanical thrombectomy by IR. ETT 8/13-15.  PMH - CAD, CHF, HLD, HTN, systolic HF, ICD implant, rt CVA.  LVAD placement and new stroke R MCA M1. Type of Study: Bedside Swallow Evaluation Previous Swallow Assessment: none Diet Prior to this Study: NPO Respiratory Status: Nasal cannula History of Recent Intubation: Yes Total duration of intubation (days): 2 days Date extubated: 08/31/22 Behavior/Cognition: Alert;Cooperative;Pleasant mood Oral Cavity Assessment: Within Functional Limits Oral Care Completed by SLP: No Oral Cavity - Dentition: Edentulous Patient Positioning: Upright in bed Baseline Vocal Quality: Normal Volitional Cough: Weak Volitional Swallow: Able to elicit    Oral/Motor/Sensory Function Overall Oral Motor/Sensory Function: Mild impairment Facial ROM: Reduced left Facial Symmetry: Abnormal symmetry left Lingual ROM: Within Functional Limits Lingual Symmetry: Within Functional Limits Lingual Strength: Within Functional Limits Velum:  Within Functional Limits Mandible: Within Functional Limits   Ice Chips Ice chips: Not tested   Thin Liquid Thin Liquid: Impaired Pharyngeal  Phase Impairments: Cough - Immediate    Nectar Thick Nectar Thick Liquid: Not tested   Honey Thick Honey Thick Liquid: Not tested   Puree Puree: Within functional limits Presentation: Spoon   Solid     Solid: Not tested Other Comments: pt declined 2/2 recent dental extractions      Charles Holmes E Jermon Chalfant 09/01/2022,11:34 AM

## 2022-09-01 NOTE — Progress Notes (Addendum)
Advanced Heart Failure Rounding Note  PCP-Cardiologist: Norman Herrlich, MD  [facility]: Dr. Gala Romney   Subjective:    7/25: Admit with cardiogenic shock. Started milrinone and NE. 8/6 S/P 13 teeth extractions  8/9 IABP placed 8/13 S/p HM III LVAD implant + clipping left atrial appendage d/t severe thickening and invagination of mitral valve annulus impeding flows 8/14 Left-sided hemiplegia. CT head with acute R MCA infarct. IR for thrombectomy. CT head with small to moderate area of hemorrhagic conversion 8/15 extubated   Post Op Day #3  Remains on 0.5 milrinone + 4 Epi. iNO at 5   SWAN #s CO 4.90 CI 2.6 PA 45/25  CVP 13 PAPi 1.5  Extubated. Awake, A&Ox3. Able to move left extremities but c/w left sided weakness. Says he feels congested and coughing. Low grade temp overnight, 99.1. WBC 13>>19K. Has not had swallow eval yet. Hungry and asking for ice cream and pudding.   Hgb 9.5. CTOP 490 cc Scr 0.74. Only 350 in UOP yesterday  Tbili 4.6>>5.1   LVAD Interrogation HM III: Speed: 5300 Flow: 4.3 PI: 4.4 Power: 3.8. No PI events today     Objective:   Weight Range: 86.6 kg Body mass index is 29.9 kg/m.   Vital Signs:   Temp:  [98.2 F (36.8 C)-99.1 F (37.3 C)] 99.1 F (37.3 C) (08/16 0739) Pulse Rate:  [29-206] 104 (08/16 0739) Resp:  [12-22] 15 (08/16 0739) BP: (98-111)/(67-83) 111/67 (08/16 0700) SpO2:  [94 %-100 %] 99 % (08/16 0739) Arterial Line BP: (78-97)/(62-86) 89/78 (08/16 0415) FiO2 (%):  [50 %] 50 % (08/15 1032) Weight:  [86.6 kg] 86.6 kg (08/16 0600) Last BM Date : 08/28/22  Weight change: Filed Weights   08/30/22 0500 08/31/22 0500 09/01/22 0600  Weight: 83.3 kg 86.7 kg 86.6 kg    Intake/Output:   Intake/Output Summary (Last 24 hours) at 09/01/2022 0743 Last data filed at 09/01/2022 0700 Gross per 24 hour  Intake 2174.51 ml  Output 1570 ml  Net 604.51 ml   Physical Exam  CVP 12-14  GENERAL: sitting up in bed, conversant. No distress   HEENT: normal  NECK: Supple, R internal jugular SWAN.   CARDIAC:  Mechanical heart sounds with LVAD hum present. +CTs  LUNGS:  course anteriorly   ABDOMEN:  Soft, round, nontender, positive bowel sounds x4.     LVAD exit site: Dressing dry and intact.  Stabilization device present and accurately applied.   EXTREMITIES:  Warm and dry, no cyanosis, clubbing, rash or trace b/l LE edema  NEUROLOGIC:  A&Ox3. Able to move LUE but continues w/ left sided weakness. Affect pleasant  GU: + Foley    Telemetry     V paced 90s   Labs    CBC Recent Labs    08/30/22 0258 08/30/22 0304 08/31/22 0310 08/31/22 0329 08/31/22 1504 09/01/22 0500  WBC 11.6*   < > 13.7*  --   --  19.6*  NEUTROABS 9.9*  --  11.0*  --   --   --   HGB 9.0*   < > 7.9*   < > 9.5* 9.5*  HCT 27.3*   < > 24.8*   < > 28.0* 30.0*  MCV 96.8   < > 100.0  --   --  99.3  PLT 167   < > 139*  --   --  174   < > = values in this interval not displayed.    Basic Metabolic Panel Recent Labs  08/31/22 0310 08/31/22 0329 08/31/22 1504 09/01/22 0500  NA 132*   < > 134* 134*  K 4.0   < > 4.2 4.4  CL 103  --   --  104  CO2 23  --   --  22  GLUCOSE 119*  --   --  112*  BUN 9  --   --  17  CREATININE 0.88  --   --  0.74  CALCIUM 8.0*  --   --  8.4*  MG 2.2  --   --  2.3  PHOS 2.8  --   --  3.1   < > = values in this interval not displayed.    BNP: BNP (last 3 results) Recent Labs    07/26/22 1211 08/10/22 1142 08/30/22 0258  BNP 1,723.6* 1,905.0* 922.3*     Imaging    No results found.   Medications:     Scheduled Medications:  sodium chloride   Intravenous Once   acetaminophen  1,000 mg Oral Q6H   Or   acetaminophen (TYLENOL) oral liquid 160 mg/5 mL  1,000 mg Per Tube Q6H   aspirin EC  325 mg Oral Daily   Or   aspirin  324 mg Per Tube Daily   Or   aspirin  300 mg Rectal Daily   atorvastatin  80 mg Per Tube Daily   bisacodyl  10 mg Oral Daily   Or   bisacodyl  10 mg Rectal Daily    Chlorhexidine Gluconate Cloth  6 each Topical Q0600   dorzolamide-timolol  1 drop Both Eyes BID   insulin aspart  0-24 Units Subcutaneous Q4H   latanoprost  1 drop Both Eyes QHS   metoCLOPramide (REGLAN) injection  10 mg Intravenous Q6H   mexiletine  250 mg Per Tube BID   mometasone-formoterol  2 puff Inhalation BID   multivitamin with minerals  1 tablet Per Tube Daily   pantoprazole (PROTONIX) IV  40 mg Intravenous QHS   sildenafil  20 mg Per Tube TID   sodium chloride flush  3 mL Intravenous Q12H   sodium chloride flush  3 mL Intravenous Q12H   umeclidinium bromide  1 puff Inhalation Daily    Infusions:  sodium chloride Stopped (08/30/22 0516)   sodium chloride 10 mL/hr at 09/01/22 0700   sodium chloride     sodium chloride 10 mL/hr at 09/01/22 0700   sodium chloride     sodium chloride     epinephrine 4 mcg/min (09/01/22 0700)   lactated ringers     lactated ringers     lactated ringers 20 mL/hr at 09/01/22 0700   milrinone 0.5 mcg/kg/min (09/01/22 0700)   norepinephrine (LEVOPHED) Adult infusion Stopped (08/30/22 0625)    PRN Medications: sodium chloride, sodium chloride, acetaminophen, albuterol, dextrose, lactated ringers, midazolam, morphine injection, morphine injection, ondansetron (ZOFRAN) IV, mouth rinse, mouth rinse, oxyCODONE, sodium chloride flush, sodium chloride flush, traMADol   Patient Profile  Mr Rosser is a 57 y.o. 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 Admitted with cardiogenic shock, stabilized and underwent HM3 LVAD. Post implant course c/b acute CVA.   Assessment/Plan   1.  Acute on chronic Systolic HF-->Cardiogenic Shock  - Diagnosed 11/2019. Presented with VT. LHC 70% LAD  - cMRI 12/21 concerning for sarcoid and EF 18%.  - PET 2/22 at [facility] EF 25% + active sarcoid - Echo 08/26/20 EF < 20% severely dilated LV  RV mildly decreased.  - [employer] CRT-D upgrade in 06/08/21 - Echo  07/10/22: EF <20%, RV okay, mod pericardial effusion, mod MR/TR - Admitted 07/25 with cardiogenic shock. - RHC: Nonobstructive CAD, severely elevated filling pressures and low Fick CO/CI (2.7/1.4) - 08/13 HM III LVAD implant + clipping LAA d/t severe thickening and invagination of mitral valve annulus impeding flows.  - Apical core sent to pathology to confirm diagnosis of cardiac sarcoid >> pending - Speed increased to 5300 on 08/14. PI trended down with higher speed. On milrinone 0.5 + Epi 4 with CI of 2.6 Flow 4.3. Difficulty weaning off iNO. Transitioning to sildenafil 20 TID (if fails swallow eval will need cor-track for meds) - CVP 12-14. Restart IV diuretics and monitor UOP  - Repeat CT head on 08/17. If bleed stable, plan to start low-dose heparin per discussion with Neuro.  2.  Acute stroke - Hx CVA 06/24 -Admitted 06/24 w/ R MCA stroke. S/p TPA and mechanical clot extraction. No residual deficits. Likely cardioembolic in setting of severe LV dysfunction. - Developed left sided weakness 08/14. CTA with R MCA infarct. Taken to IR for thrombectomy - Repeat CT head with small to moderate size hemorrhagic conversion. CT head this am stable. - D/w neuro, plan repeat head CT on 8/17  - C/w PT/OT. Will need swallow eval. If fails will need cor-track for meds  - will need CIR once medically stable for d/c (team following)   3. 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 Continue mexilitene  - now s/p ICD.   4. CAD - LHC 12/07/19 70-% LAD, no intervention - LHC 8/24 non obstructive CAD.  - No s/s angina - Continue statin. On aspirin for VAD.  5. Cardiac sarcoid - PET 2/22 at [facility] EF 25% + active sarcoid - Has completed prednisone.  - holding methotrexate w/ recent surgery  - Sent apical core to pathology to confirm diagnosis of cardiac sarcoid  6. Paroxsymal AT/AF - Currently in SR - holding Anticoagulation as above. If repeat head CT is stable, will  start low dose heparin    7. AKI - suspect cardiorenal, improved w/ inotropic support - Creatinine now improved to 0.74 but oliguric  - follow BMP w/ diuresis    8. Iron deficiency anemia/ Post-op anemia - recent T sat 15%, scheduled for OP feraheme. Will complete inpatient  - Transfused 1 u RBCs 8/15 - hgb stable 9.5 today    9. Pulmonary  - PFTs with severe obstructive defect, response to bronchodilator. FEV1 1.04L, FEV1/FVC 48% - extubated 8/15  - remains on iNO. Will try weaning and transition to sildenafil  - w/ low grade fever + leukocytosis, will check respiratory and blood cultures and start empiric abx   10. Elevated LFTs Elevated T bili -Post-op -AST/ALT improved, T bili 4.6>>5.1  -Follow    Length of Stay: [address] PA-C  09/01/2022, 7:43 AM  VAD Team Pager [phone] (7am - 7am)   Advanced Heart Failure Team Pager [phone] (M-F; 7a - 5p)  Please contact [facility] for night-coverage after hours (5p -7a ) and weekends on [email or web address]  Agree with above.   POD #3. Now extubated. Remains on milrinone 0.5 and epi 4 + iNO via [place].   Speech and left-sided strength improving but failed swallow study.'  Flows on VAD increased.   WBC up. Low-grade fevers  General:  Sitting up in bed. Mild jaundice HEENT: normal  +  scleral icterus  Neck: supple. RIJ swan  Cor: sternal wound ok LVAD hum.  Lungs: decreased Abdomen: soft, nontender, non-distended. No hepatosplenomegaly. No bruits or masses. Good bowel sounds. Driveline site clean. Anchor in place.  Extremities: no cyanosis, clubbing, rash. 2+ edema  Neuro: alert & oriented x 3. Mild dysarthria. Weak on left  Remains tenuous but improving. Would cut milrinone to 0.375. Wean epi slowly.   With hematoma on posterior MV annulus he has been very volume sensitive with obstruction of mitral inflow with attempts to increase speed or diurese. Would hold diuresis one more day. Can start tomorrow.   With WBC  and temps -> cx drawn. Start abx  Plan repeat head CT in am. If ICH stable will start gentle AC. Place Cor-trak   Need to wean iNO slowly as previously we were unable to stop due to marked rise in R-sided pressures and decreased flows on VAD.   Mobilize.   VAD interrogated personally. Parameters stable.  CRITICAL CARE Performed by: Arvilla Meres  Total critical care time: 60 minutes  Critical care time was exclusive of separately billable procedures and treating other patients.  Critical care was necessary to treat or prevent imminent or life-threatening deterioration.  Critical care was time spent personally by me (independent of midlevel providers or residents) on the following activities: development of treatment plan with patient and/or surrogate as well as nursing, discussions with consultants, evaluation of patient's response to treatment, examination of patient, obtaining history from patient or surrogate, ordering and performing treatments and interventions, ordering and review of laboratory studies, ordering and review of radiographic studies, pulse oximetry and re-evaluation of patient's condition.  Arvilla Meres, MD  9:33 PM

## 2022-09-01 NOTE — Progress Notes (Signed)
CSW visited bedside although patient was resting comfortably. No family at bedside at the time of visit. CSW continues to follow for supportive needs throughout implant hospitalization.  Lasandra Beech, LCSW, CCSW-MCS [phone]

## 2022-09-01 NOTE — Progress Notes (Signed)
Pharmacy Antibiotic Note  Charles Holmes is a 57 y.o. male admitted on 08/10/2022 now s/p LVAD placement 8/13 with fever 99 and increased WBC 19. PCT 0.4.  Pharmacy has been consulted for Vancomycin and cefepime dosing. Cr 1 crcl 153ml/min Last post op vancomycin dose was 8/14  Plan: Vancomycin 1500mg  IV x1 then 1gm IV q12h Cefepime 2gm IV q8h Will f/u renal function and pt's clinical condition  Height: 5\' 7"  (170.2 cm) Weight: 86.6 kg (190 lb 14.7 oz) IBW/kg (Calculated) : 66.1  Temp (24hrs), Avg:98.9 F (37.2 C), Min:98.2 F (36.8 C), Max:99.3 F (37.4 C)  Recent Labs  Lab 08/29/22 2215 08/30/22 0258 08/30/22 1657 08/31/22 0310 09/01/22 0500  WBC 11.0* 11.6* 12.1* 13.7* 19.6*  CREATININE 0.90 0.84 0.85 0.88 0.74    Estimated Creatinine Clearance: 104.5 mL/min (by C-G formula based on SCr of 0.74 mg/dL).    Allergies  Allergen Reactions   Amiodarone     Severe tremors   Percocet [Oxycodone-Acetaminophen] Itching     Leota Sauers Pharm.D. CPP, BCPS Clinical Pharmacist [phone] 09/01/2022 4:06 PM

## 2022-09-01 NOTE — Procedures (Signed)
Cortrak  Person Inserting Tube:  Thomas, Aspen T, RD Tube Type:  Cortrak - 43 inches Tube Size:  10 Tube Location:  Left nare Secured by: Bridle Technique Used to Measure Tube Placement:  Marking at nare/corner of mouth Cortrak Secured At:  65 cm   Cortrak Tube Team Note:  Consult received to place a Cortrak feeding tube.   X-ray is required, abdominal x-ray has been ordered by the Cortrak team. Please confirm tube placement before using the Cortrak tube.   If the tube becomes dislodged please keep the tube and contact the Cortrak team at [email or web address] for replacement.  If after hours and replacement cannot be delayed, place a NG tube and confirm placement with an abdominal x-ray.    Aspen Thomas RD, LDN For contact information, refer to AMiON.   

## 2022-09-01 NOTE — Progress Notes (Addendum)
STROKE TEAM PROGRESS NOTE   BRIEF HPI Charles Holmes is a 57 y.o. 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 He was noted to be moving all extremities and lift hem up of the bed around midnight. As sedation was being weaned in anticipation of eventual extubation in AM, he was noted to not be able to move his LUE and LLE. A code stroke was activated. R MCA M1 occlusion and received manual thrombectomy.    SIGNIFICANT HOSPITAL EVENTS 7/25: ED to Hospital admission 8/13: LVAD placement 8/14: R MCA M1 occlusion and manual thrombectomy 8/15: extubated  INTERIM HISTORY/SUBJECTIVE Pt is alert today and successfully extubated. Pt does not report any concerns excpet for continued weakness. We discussed the need to check his brain for bleeding tomorrow so that we can restart his anticoagulation for stroke preventions and for LVAD.   OBJECTIVE  CBC    Component Value Date/Time   WBC 19.6 (H) 09/01/2022 0500   RBC 3.02 (L) 09/01/2022 0500   HGB 9.5 (L) 09/01/2022 0500   HGB 14.3 05/31/2021 0906   HCT 30.0 (L) 09/01/2022 0500   HCT 42.6 05/31/2021 0906   PLT 174 09/01/2022 0500   PLT 203 05/31/2021 0906   MCV 99.3 09/01/2022 0500   MCV 98 (H) 05/31/2021 0906   MCH 31.5 09/01/2022 0500   MCHC 31.7 09/01/2022 0500   RDW 18.9 (H) 09/01/2022 0500   RDW 15.8 (H) 05/31/2021 0906   LYMPHSABS 0.3 (L) 08/31/2022 0310   LYMPHSABS 0.9 05/31/2021 0906   MONOABS 2.2 (H) 08/31/2022 0310   EOSABS 0.0 08/31/2022 0310   EOSABS 0.1 05/31/2021 0906   BASOSABS 0.0 08/31/2022 0310   BASOSABS 0.0 05/31/2021 0906    BMET    Component Value Date/Time   NA 134 (L) 09/01/2022 0500   NA 143 05/31/2021 0906   K 4.4 09/01/2022 0500   CL 104 09/01/2022 0500   CO2 22 09/01/2022 0500   GLUCOSE 112 (H) 09/01/2022 0500   BUN 17 09/01/2022 0500   BUN 12  05/31/2021 0906   CREATININE 0.74 09/01/2022 0500   CALCIUM 8.4 (L) 09/01/2022 0500   EGFR 80 05/31/2021 0906   GFRNONAA >60 09/01/2022 0500    IMAGING past 24 hours DG Chest Port 1 View  Result Date: 09/01/2022 CLINICAL DATA:  History of LVAD EXAM: PORTABLE CHEST 1 VIEW COMPARISON:  Chest radiograph dated 08/31/2022 FINDINGS: Lines/tubes: Interval extubation and removal of enteric catheter. Right IJ PA catheter tip is obscured by the median sternotomy wire. Similar position of bilateral pleural catheters. Left chest wall ICD leads project over the right atrium and ventricle. Similar position of LVAD. Lungs: Persistent bilateral patchy and linear opacities. Pleura: No pneumothorax or pleural effusion. Heart/mediastinum: Similar mildly enlarged cardiomediastinal silhouette. Bones: Median sternotomy wires are nondisplaced. IMPRESSION: 1. Interval extubation and removal of enteric catheter. 2. Persistent bilateral patchy and linear opacities, likely atelectasis. 3. Similar cardiomegaly. Electronically Signed   By: Agustin Cree M.D.   On: 09/01/2022 08:28    Vitals:   09/01/22 1115 09/01/22 1130 09/01/22 1145 09/01/22 1200  BP: (!) 87/72  (!) 85/74 96/79  Pulse: 95 95 95 88  Resp: 16 16 19 17   Temp: 99 F (37.2 C) 99.1 F (37.3 C) 99.1 F (37.3 C) 99.1 F (37.3 C)  TempSrc:    Core  SpO2: 100% 98% 94%  98%  Weight:      Height:         PHYSICAL EXAM General:  awake and cooperative.  CV: Regular rate and rhythm on monitor Respiratory:  normal effort.   NEURO:  Mental Status: alert and oriented to person, place, time. Attention appears intact. Did not assess memory.   Speech/Language: Speech fluent without aphasia or dysarthria. intact naming, comprehension, repetition. Not speaking in full sentences.   Cranial Nerves:  II: PERRL. No blink reflex to left visual field threat.   III, IV, VI: EOMI. Eyelid elevate symmetrically.   V: sensation intact bilateraly.   VII: Face is symmetrical  resting and smiling.  VIII: hearing intact.   IX, X: normal changes in phonation.   XI: intact.   XII: tongue midline.  Motor: RUE, RLE, LLE hold against gravity without drift. LUE drift present.   Tone: is normal and bulk is normal Sensation- intact bilaterally.   Coordination: no ataxia as evident by normal HTN and FTN.    Gait- unable to assess.    ASSESSMENT/PLAN  Stroke - right MCA infarcts with hemorrhagic transformation due to right M1 occlusion s/p IR with TICI3 Etiology:  cardioembolic source (PAF, LVAD, cardiomyopathy with low EF) Code Stroke CT head Acute infarct R temporal cortex and basal ganglia. No acute hemorrhage. ASPECTS 7.    CTA head & neck emergent R M1 occlusion.  CTP 12/102 S/p IR with TICI3 Repeat CT 8/14 Hemorrhagic transformation of evolving infarct in the right caudate nucleus with small volume intraventricular extension of hemorrhage. Repeat CT 8/15 unchanged extent of hemorrhage in right MCA distribution Repeat CT head 8/17 pending tomorrow Echo 8/8 EF 15%, LV severely decreased function, with global hypokinesis LDL 51 HgbA1c 5.5 VTE prophylaxis - SCDs Eliquis (apixaban) daily prior to admission, on ASA now, will repeat CT scheduled tomorrow morning, will restart eliquis if stable CT. Therapy recommendations:  CIR Disposition:  pending  Hx of Stroke/TIA 06/2022: admitted for left sided weakness, CT no acute finding. S/p TPA. CTA head and neck showed right M1 occlusion s/p IR with TICI3. MRI right temporal, insular and BG infarcts. MRA showed right MCA patent. EF < 20%, LDL 59 and A1C 5.1. etiology likely due to cardiomyopathy with low EF, discharged on eliquis and lipitor 80  Atrial fibrillation Home Meds: eliquis 5 mg twice daily, carvedilol 3.125 mg twice daily Continue telemetry monitoring Repeat CT tomorrow morning, will restart eliquis if stable bleeding.   NICM CHF s/p ICD 02/2020 VT with cardiac sarcoidosis  EF 15% with LV severely decreased  function, with global hypokinesis 08/29/22 LVAD placed Cardiology on board Repeat CT in am, will restart eliquis if bleeding stable On milrinone infusion   Hx of hypertension hypotension Home meds:  carvedilol 3.125 mg twice daily, furosemide 40 mg twice daily, spironolactone 12.5 mg once daily Labile and soft BPs in the setting of heart failure On epinephrine Blood pressure goal: normotensive  Hyperlipidemia Home meds:  atorvastatin 80 mg once daily, resumed in hospital LDL 51, goal < 70 Continue statin at discharge  Dysphagia Patient has post-stroke dysphagia, SLP consulted NPO On TF @ 60cc Advance diet as tolerated  Other Stroke Risk Factors ETOH use, advised to drink no more than 2 drink(s) a day  Other Active Problems Acute respiratory insufficiency, extubated Leukocytosis, 13.7-> 19.6, cultures pending, no fever, on cefepime   Hospital day # 22  Meryl Dare, MD PGY-1 Psychiatry Resident   ATTENDING NOTE: I reviewed above note and agree with the  assessment and plan. Pt was seen and examined.   RN at the bedside. Pt BP still on the low end, still in epi infusion. On exam, pt awake, alert, eyes open, orientated to age, place, time. No aphasia, fluent language, following all simple commands, mild dysarthria. Able to name and repeat and read. No gaze palsy, tracking bilaterally, visual field full, PERRL. Left facial droop. Tongue midline. LUE 3/5 and RUE 4+/5. LLE 2+/5 and RLE 3/5. Sensation symmetrical bilaterally, Right FTN intact grossly, gait not tested.   Plan for CT repeat in am, if bleeding stable, will consider resume AC with eliquis. BP still low on epi. CHF, hypotension management per primary team. Currently still NPO, on tube feeding. PT and OT recommend CIR.   For detailed assessment and plan, please refer to above/below as I have made changes wherever appropriate.   Marvel Plan, MD PhD Stroke Neurology 09/01/2022 8:52 PM  This patient is critically ill  due to heart failure, right MCA infarct s/p thrombectomy, hypotension and at significant risk of neurological worsening, death form cardiogenic shock, cardiac arrest, recurrent infarct, hemorrhagic conversion, seizure. This patient's care requires constant monitoring of vital signs, hemodynamics, respiratory and cardiac monitoring, review of multiple databases, neurological assessment, discussion with family, other specialists and medical decision making of high complexity. I spent 40 minutes of neurocritical care time in the care of this patient.    To contact Stroke Continuity provider, please refer to [email or web address]. After hours, contact General Neurology

## 2022-09-01 NOTE — Plan of Care (Addendum)
  Problem: Activity: Goal: Ability to return to baseline activity level will improve Outcome: Progressing   Problem: Cardiovascular: Goal: Ability to achieve and maintain adequate cardiovascular perfusion will improve Outcome: Progressing   Problem: Clinical Measurements: Goal: Cardiovascular complication will be avoided Outcome: Progressing

## 2022-09-01 NOTE — Progress Notes (Signed)
HeartMate 3 Rounding Note  POD #3 Implant HM-3  Subjective:   57 year old male with cardiac sarcoidosis, nonischemic cardiomyopathy, and advanced heart failure refractory to optimal medical therapy underwent HeartMate 3 implantation on August 13 and required preoperative support with intra-aortic balloon pump and inotropic therapy.  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  Intraoperatively the patient had moderate RV dysfunction and following VAD implantation there was some obstruction to the inflow through the mitral valve from posterior annular thickening/ hematoma which was not clearly defined as the inflow cannula was in good position and far removed from the mitral valve.  After application of a left atrial clip the inflow appeared to be improved.  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 Off anticoagulation now  due to mild hemorrhagic extension by f/u CT.   The nitric oxide wean could not be turned off due to low VAD flow, increased CVP from 1 ppm to zero and patient extubated POD 2 to nasal cannula  connected with 5ppm NO   LVAD INTERROGATION:  HeartMate II LVAD:  Flow 4.2  liters/min, speed 5300 rpm, power 3.6, PI 4.1.  Controller intact.   Objective:    Vital Signs:   Temp:  [98.2 F (36.8 C)-99.1 F (37.3 C)] 99.1 F (37.3 C) (08/16 0739) Pulse Rate:  [29-206] 104 (08/16 0739) Resp:  [12-22] 15 (08/16 0739) BP: (98-111)/(67-87) 107/87 (08/16 0850) SpO2:  [94 %-100 %] 99 % (08/16 0739) Arterial Line BP: (79-97)/(63-86) 89/78 (08/16 0415) FiO2 (%):  [50 %] 50 % (08/15 1032) Weight:  [86.6 kg] 86.6 kg (08/16 0600) Last BM Date : 08/28/22 Mean arterial Pressure  88-92 mm Hg  Intake/Output:  Exam  General -alert moving L side better, speech better HEENT:-No bleeding from mouth at extraction sits Neck: supple. JVP . Carotids without bruits. No lymphadenopathy or thryomegaly appreciated. Cor: Mechanical heart sounds with LVAD hum present. Lungs: clear Abdomen: soft, nontender, nondistended. No hepatosplenomegaly. No bruits or masses. Good bowel sounds. Extremities: no cyanosis, clubbing, rash, edema Neuro: alert & orientedx3, cranial nerves grossly intact. moves all 4 extremities w/o difficulty. Affect pleasant  Telemetry: paced with PPM 90/min  Labs: Basic Metabolic Panel: Recent Labs  Lab 08/29/22 2215 08/30/22 0258 08/30/22 0304 08/30/22 1657 08/30/22 1707 08/31/22 0310 08/31/22 0329 08/31/22 1322 08/31/22 1504 09/01/22 0500  NA 135  132* 133*   < > 135   < > 132* 135 135 134* 134*  K 4.5  4.4 4.8   < > 4.4   < > 4.0 4.1 4.2 4.2 4.4  CL 101 103  --  103  --  103  --   --   --  104  CO2 21* 23  --  22  --  23  --   --   --  22  GLUCOSE 137* 118*  --  128*  --  119*  --   --   --  112*  BUN 11 11  --  9  --  9  --   --   --  17  CREATININE 0.90 0.84  --  0.85  --  0.88  --   --   --  0.74  CALCIUM 7.7* 8.0*  --  8.0*  --  8.0*  --   --   --  8.4*  MG 2.6* 2.3  --  2.2  --  2.2  --   --   --  2.3  PHOS  --  3.1  --   --   --  2.8  --   --   --  3.1   < > = values in this interval not displayed.    Liver Function Tests: Recent Labs  Lab 08/27/22 0446 08/30/22 0258 08/31/22 0310 09/01/22 0500  AST 27 218* 96* 73*  ALT 38 45* 30 23  ALKPHOS 73 46 41 58  BILITOT 1.0 4.4* 4.6* 5.1*  PROT 5.3* 5.1* 4.9* 5.3*  ALBUMIN 2.9* 3.2* 2.7* 2.7*   No results for input(s): "LIPASE", "AMYLASE" in the last 168 hours. No results for input(s): "AMMONIA" in the last 168 hours.  CBC: Recent Labs  Lab 08/29/22 2215 08/30/22 0258 08/30/22 0304 08/30/22 1657 08/30/22 1707 08/31/22 0310 08/31/22 0329 08/31/22 1322 08/31/22 1504  09/01/22 0500  WBC 11.0* 11.6*  --  12.1*  --  13.7*  --   --   --  19.6*  NEUTROABS  --  9.9*  --   --   --  11.0*  --   --   --   --   HGB 9.5*  9.0* 9.0*   < > 8.4*   < > 7.9* 8.2* 9.5* 9.5* 9.5*  HCT 28.0*  28.2* 27.3*   < > 25.9*   < > 24.8* 24.0* 28.0* 28.0* 30.0*  MCV 99.6 96.8  --  100.0  --  100.0  --   --   --  99.3  PLT 163 167  --  144*  --  139*  --   --   --  174   < > = values in this interval not displayed.    INR: Recent Labs  Lab 08/28/22 0923 08/29/22 1535 08/30/22 0258 08/31/22 0310 09/01/22 0500  INR 1.1 1.4* 1.4* 1.6* 1.5*    Other results: EKG:   Imaging: DG Chest Port 1 View  Result Date: 09/01/2022 CLINICAL DATA:  History of LVAD EXAM: PORTABLE CHEST 1 VIEW COMPARISON:  Chest radiograph dated 08/31/2022 FINDINGS: Lines/tubes: Interval extubation and removal of enteric catheter. Right IJ PA catheter tip is obscured by the median sternotomy wire. Similar position of bilateral pleural catheters. Left chest wall ICD leads project over the right atrium and ventricle. Similar position of LVAD. Lungs: Persistent bilateral patchy and linear opacities. Pleura: No pneumothorax or pleural effusion. Heart/mediastinum: Similar mildly enlarged cardiomediastinal silhouette. Bones: Median sternotomy wires are nondisplaced. IMPRESSION: 1. Interval extubation and removal of enteric catheter. 2. Persistent bilateral patchy and linear opacities, likely atelectasis. 3. Similar cardiomegaly. Electronically Signed   By: Agustin Cree M.D.   On: 09/01/2022 08:28   DG Chest Port 1 View  Result Date: 08/31/2022 CLINICAL DATA:  Left ventricular assist device.  Follow-. EXAM: PORTABLE CHEST 1 VIEW COMPARISON:  Radiographs 08/30/2022 and 08/29/2022.  CT 08/11/2022. FINDINGS: 0558 hours. The endotracheal tube, enteric tube, right IJ Swan-Ganz catheter, bilateral chest tubes, left subclavian AICD leads and the left ventricular assist device appear unchanged in position. There is stable  cardiomegaly post median sternotomy. Little change in the appearance of the lungs with mild bibasilar atelectasis and vascular congestion. No pneumothorax or acute osseous abnormality identified. There are old right-sided rib fractures. IMPRESSION: Stable postoperative chest. Electronically Signed   By: Carey Bullocks M.D.   On: 08/31/2022 10:30  CT HEAD WO CONTRAST ( )  Result Date: 08/31/2022 CLINICAL DATA:  Stroke follow-up EXAM: CT HEAD WITHOUT CONTRAST TECHNIQUE: Contiguous axial images were obtained from the base of the skull through the vertex without intravenous contrast. RADIATION DOSE REDUCTION: This exam was performed according to the departmental dose-optimization program which includes automated exposure control, adjustment of the mA and/or kV according to patient size and/or use of iterative reconstruction technique. COMPARISON:  Yesterday FINDINGS: Brain: Hematoma at the right caudate head with mild intraventricular extension is unchanged. Hazy high density at the right putamen show some fading, residual attributed to petechial blood. The extent of acute infarct in the right temporal cortex and right basal ganglia appears non progressed. No hydrocephalus. Vascular: No hyperdense vessel or unexpected calcification. Skull: Normal. Negative for fracture or focal lesion. Sinuses/Orbits: No acute finding. IMPRESSION: Unchanged extent of infarct and hemorrhage in the right MCA distribution including small volume intraventricular clot. No hydrocephalus. Electronically Signed   By: Tiburcio Pea M.D.   On: 08/31/2022 06:02   CT HEAD WO CONTRAST ( )  Result Date: 08/30/2022 CLINICAL DATA:  Stroke, follow up left sided weakness. EXAM: CT HEAD WITHOUT CONTRAST TECHNIQUE: Contiguous axial images were obtained from the base of the skull through the vertex without intravenous contrast. RADIATION DOSE REDUCTION: This exam was performed according to the departmental dose-optimization program which  includes automated exposure control, adjustment of the mA and/or kV according to patient size and/or use of iterative reconstruction technique. COMPARISON:  Head CT 08/30/2022. FINDINGS: Brain: Hemorrhagic transformation of evolving infarct in the right caudate nucleus with small volume intraventricular extension of hemorrhage. No acute hydrocephalus. New hyperdensity within the lentiform nucleus may reflect petechial hemorrhage versus contrast staining. Surrounding hypoattenuation in the right internal capsule and corona radiata is favored to reflect evolving infarct. Unchanged old infarct in the right anterior temporal lobe. No extra-axial collection, significant mass effect or midline shift. Vascular: No hyperdense vessel or unexpected calcification. Skull: No calvarial fracture or suspicious bone lesion. Skull base is unremarkable. Sinuses/Orbits: No acute findings. Other: None. IMPRESSION: 1. Hemorrhagic transformation of evolving infarct in the right caudate nucleus with small volume intraventricular extension of hemorrhage. No acute hydrocephalus. 2. New hyperdensity within the right lentiform nucleus may reflect petechial hemorrhage versus contrast staining. 3. Surrounding hypoattenuation in the right internal capsule and corona radiata is favored to reflect evolving infarct. Critical Value/emergent results were relayed via secure text page at the time of interpretation on 08/30/2022 at 4:32 pm to provider Dr. Selina Cooley, who acknowledged these results. Electronically Signed   By: Orvan Falconer M.D.   On: 08/30/2022 16:42   DG Chest Port 1 View  Result Date: 08/30/2022 CLINICAL DATA:  Left ventricular assist device.  Follow-. EXAM: PORTABLE CHEST 1 VIEW COMPARISON:  Radiographs 08/29/2022 and 08/28/2022.  CT 08/11/2022. FINDINGS: 0924 hours. No significant change in position of the endotracheal tube, enteric tube, right IJ Swan-Ganz catheter and bilateral chest tubes. Left subclavian AICD leads are  unchanged. The left ventricular assist device appears unchanged in position. The heart size and mediastinal contours are stable with mild cardiomegaly. Stable mild atelectasis at both lung bases. No evidence of pneumothorax, significant pleural effusion or acute osseous abnormality. IMPRESSION: Stable postoperative chest with unchanged support system and mild bibasilar atelectasis. No pneumothorax or significant pleural effusion. Electronically Signed   By: Carey Bullocks M.D.   On: 08/30/2022 13:27     Medications:     Scheduled Medications:  sodium chloride   Intravenous Once   acetaminophen  1,000 mg Oral Q6H   Or   acetaminophen (TYLENOL) oral liquid 160 mg/5 mL  1,000 mg Per Tube Q6H   aspirin EC  325 mg Oral Daily   Or   aspirin  324 mg Per Tube Daily   Or   aspirin  300 mg Rectal Daily   atorvastatin  80 mg Per Tube Daily   bisacodyl  10 mg Oral Daily   Or   bisacodyl  10 mg Rectal Daily   Chlorhexidine Gluconate Cloth  6 each Topical Q0600   dorzolamide-timolol  1 drop Both Eyes BID   insulin aspart  0-24 Units Subcutaneous Q6H   latanoprost  1 drop Both Eyes QHS   metoCLOPramide (REGLAN) injection  10 mg Intravenous Q6H   mexiletine  250 mg Per Tube BID   mometasone-formoterol  2 puff Inhalation BID   multivitamin with minerals  1 tablet Per Tube Daily   pantoprazole (PROTONIX) IV  40 mg Intravenous QHS   sildenafil  20 mg Per Tube TID   sodium chloride flush  3 mL Intravenous Q12H   sodium chloride flush  3 mL Intravenous Q12H   umeclidinium bromide  1 puff Inhalation Daily    Infusions:  sodium chloride Stopped (08/30/22 0516)   sodium chloride 10 mL/hr at 09/01/22 0700   sodium chloride     sodium chloride 10 mL/hr at 09/01/22 0700   sodium chloride     sodium chloride     epinephrine 4 mcg/min (09/01/22 0700)   lactated ringers     lactated ringers     lactated ringers 20 mL/hr at 09/01/22 0700   milrinone 0.5 mcg/kg/min (09/01/22 0700)   norepinephrine  (LEVOPHED) Adult infusion Stopped (08/30/22 0625)    PRN Medications: sodium chloride, sodium chloride, acetaminophen, albuterol, dextrose, hydrALAZINE, lactated ringers, midazolam, morphine injection, morphine injection, ondansetron (ZOFRAN) IV, mouth rinse, mouth rinse, oxyCODONE, sodium chloride flush, sodium chloride flush, traMADol   Assessment:  57 year old patient with sarcoidosis, COPD history of stroke  and nonischemic cardiomyopathy with preoperative cardiogenic shock requiring balloon pump and inotropic therapy.  Postoperative  right middle cerebral artery CVA treated with neuro-- radiology invention,   Postop RV dysfunction req inhaled NO and inotropes with incresad bilirubin 1.2>> 4.9 Plan/Discussion:   RV dysfunction status post VAD implant-continue inhaled nitric oxide 5 ppm low-dose epinephrine  Postoperative right middle cerebral CVA.  Initial hemorrhagic extension was mild and has stabilized.  Holding anticoagulation.  Surveillance and CT scan planned for tomorrow unless there are clinical changes  Patient's speech is improving but waiting for formal swallow evaluation before starting oral meds, oral diet.  White count 19K with low-grade temperature 99.2.  Blood, urine, and tracheal aspirate culture pending.  Will start broad-spectrum antibiotics while awaiting results of cultures-vancomycin and cefepime  Chest x-ray shows mild interstitial edema but no significant effusions.  Will start Lasix diuresis per advanced heart failure.  Chest tube drainage yesterday 500 cc thin fluid, leave chest tubes in today.   I reviewed the LVAD parameters from today, and compared the results to the patient's prior recorded data.  No programming changes were made.  The LVAD is functioning within specified parameters.  The patient performs LVAD self-test daily.  LVAD interrogation was negative for any significant power changes, alarms or PI events/speed drops.  LVAD equipment check completed  and is in good working order.  Back-up equipment present.   LVAD education done on emergency procedures and precautions and reviewed exit site care.  Length of  Stay: [id number]  Lovett Sox 09/01/2022, 9:04 AM

## 2022-09-01 NOTE — Progress Notes (Signed)
Physical Therapy Treatment Patient Details Name: Charles Holmes MRN: [id number] DOB: [birth year] Today's Date: 09/01/2022   History of Present Illness Pt is 57 year old presented to [facility] on  08/10/22 for acute on chronic systolic heart failure. Pt had IABP placed on 08/25/22 for ongoing cardiogenic shock. Underwent placement of LVAD on 8/13. Developed lt sided weakness on 8/14. Rt MCA CVA. Underwent mechanical thrombectomy by IR. Extubated 8/15.  PMH - CAD, CHF, HLD, HTN, systolic HF, ICD implant, rt CVA.  LVAD placement and new stroke R MCA M1.    PT Comments  Pt making slow, steady progress. Tolerated sitting EOB. Improving lt sided strength but sitting balance impaired requiring mod to max assist to maintain balance EOB. Currently recommend mechanical lift for OOB and hopeful will progress to other options next week. Patient will benefit from intensive inpatient follow up therapy, >3 hours/day     If plan is discharge home, recommend the following:     Can travel by private vehicle        Equipment Recommendations  Other (comment) (To be determined)    Recommendations for Other Services       Precautions / Restrictions Precautions Precautions: Sternal;Fall;Other (comment) Precaution Comments: LVAD, Swan, chest tubes, multiple lines.  Extubated 8/15. Restrictions Other Position/Activity Restrictions: Sternal Precautions     Mobility  Bed Mobility Overal bed mobility: Needs Assistance Bed Mobility: Supine to Sit, Sit to Supine     Supine to sit: +2 for physical assistance, Max assist, HOB elevated Sit to supine: +2 for physical assistance, Max assist   General bed mobility comments: Assist for all aspects    Transfers                        Ambulation/Gait                   Stairs             Wheelchair Mobility     Tilt Bed    Modified Rankin (Stroke Patients Only)       Balance Overall balance assessment: Needs  assistance Sitting-balance support: Bilateral upper extremity supported, Feet supported Sitting balance-Leahy Scale: Poor Sitting balance - Comments: Mod to max assist for sitting EOB. Pt with some pushing with RUE causing incr lt lateral lean. Attempted to correct with propping on rt elbow and reaching with RUE Postural control: Posterior lean, Left lateral lean                                  Cognition Arousal: Alert Behavior During Therapy: WFL for tasks assessed/performed Overall Cognitive Status: Impaired/Different from baseline Area of Impairment: Problem solving, Awareness, Following commands                       Following Commands: Follows one step commands consistently, Follows one step commands with increased time   Awareness: Emergent Problem Solving: Slow processing, Requires verbal cues General Comments: Pt did not sleep at all last night        Exercises General Exercises - Upper Extremity Shoulder Flexion: AAROM, Left, 5 reps, Supine Wrist Flexion: AROM, Left, 5 reps, Supine Wrist Extension: AROM, Left, 5 reps, Supine Digit Composite Flexion: AAROM, Left, 5 reps, Supine Composite Extension: AAROM, Left, 5 reps, Supine General Exercises - Lower Extremity Short Arc Quad: AROM, Left, 5 reps, Supine Heel Slides: AAROM, Left,  5 reps, Supine Straight Leg Raises: AAROM, Left, 5 reps, Supine    General Comments        Pertinent Vitals/Pain Pain Assessment Pain Assessment: Faces Faces Pain Scale: Hurts even more Pain Location: everywhere Pain Descriptors / Indicators: Sore, Guarding, Grimacing Pain Intervention(s): Monitored during session, Repositioned    Home Living                          Prior Function            PT Goals (current goals can now be found in the care plan section) Acute Rehab PT Goals Patient Stated Goal: return home Progress towards PT goals: Progressing toward goals    Frequency    Min  1X/week      PT Plan      Co-evaluation              AM-PAC PT "6 Clicks" Mobility   Outcome Measure  Help needed turning from your back to your side while in a flat bed without using bedrails?: Total Help needed moving from lying on your back to sitting on the side of a flat bed without using bedrails?: Total Help needed moving to and from a bed to a chair (including a wheelchair)?: Total Help needed standing up from a chair using your arms (e.g., wheelchair or bedside chair)?: Total Help needed to walk in hospital room?: Total Help needed climbing 3-5 steps with a railing? : Total 6 Click Score: 6    End of Session Equipment Utilized During Treatment: Oxygen Activity Tolerance: Patient limited by pain Patient left: in bed;with call bell/phone within reach;with nursing/sitter in room Nurse Communication: Mobility status;Need for lift equipment (nurses assisting with mobility) PT Visit Diagnosis: Other abnormalities of gait and mobility (R26.89);Difficulty in walking, not elsewhere classified (R26.2);Hemiplegia and hemiparesis Hemiplegia - Right/Left: Left Hemiplegia - dominant/non-dominant: Non-dominant Hemiplegia - caused by: Cerebral infarction     Time: 1430-1500 PT Time Calculation (min) (ACUTE ONLY): 30 min  Charges:    $Therapeutic Activity: 23-37 mins PT General Charges $$ ACUTE PT VISIT: 1 Visit                     [facility] PT Acute Rehabilitation Services Office [phone]    [name] [facility] 09/01/2022, 5:19 PM

## 2022-09-01 NOTE — Progress Notes (Signed)
Right radial arterial line unable to pull back blood, poor readings, as well as bleeding at site. Arterial line removed without complication. RN and MD notified.

## 2022-09-02 ENCOUNTER — Inpatient Hospital Stay (HOSPITAL_COMMUNITY): Payer: Medicare Other

## 2022-09-02 DIAGNOSIS — R1310 Dysphagia, unspecified: Secondary | ICD-10-CM

## 2022-09-02 DIAGNOSIS — Z95811 Presence of heart assist device: Secondary | ICD-10-CM | POA: Diagnosis not present

## 2022-09-02 DIAGNOSIS — I639 Cerebral infarction, unspecified: Secondary | ICD-10-CM | POA: Diagnosis not present

## 2022-09-02 DIAGNOSIS — I5023 Acute on chronic systolic (congestive) heart failure: Secondary | ICD-10-CM | POA: Diagnosis not present

## 2022-09-02 DIAGNOSIS — I428 Other cardiomyopathies: Secondary | ICD-10-CM | POA: Diagnosis not present

## 2022-09-02 DIAGNOSIS — Z515 Encounter for palliative care: Secondary | ICD-10-CM | POA: Diagnosis not present

## 2022-09-02 DIAGNOSIS — I509 Heart failure, unspecified: Secondary | ICD-10-CM | POA: Diagnosis not present

## 2022-09-02 LAB — MAGNESIUM
Magnesium: 2.1 mg/dL (ref 1.7–2.4)
Magnesium: 2 mg/dL (ref 1.7–2.4)

## 2022-09-02 LAB — COOXEMETRY PANEL
Carboxyhemoglobin: 0.8 % (ref 0.5–1.5)
Carboxyhemoglobin: 1.7 % — ABNORMAL HIGH (ref 0.5–1.5)
Methemoglobin: 0.7 % (ref 0.0–1.5)
Methemoglobin: 0.8 % (ref 0.0–1.5)
O2 Saturation: 62.6 %
O2 Saturation: 68.9 %
Total hemoglobin: 9.2 g/dL — ABNORMAL LOW (ref 12.0–16.0)
Total hemoglobin: 9.4 g/dL — ABNORMAL LOW (ref 12.0–16.0)

## 2022-09-02 LAB — COMPREHENSIVE METABOLIC PANEL
ALT: 27 U/L (ref 0–44)
AST: 65 U/L — ABNORMAL HIGH (ref 15–41)
Albumin: 2.4 g/dL — ABNORMAL LOW (ref 3.5–5.0)
Alkaline Phosphatase: 97 U/L (ref 38–126)
Anion gap: 5 (ref 5–15)
BUN: 22 mg/dL — ABNORMAL HIGH (ref 6–20)
CO2: 24 mmol/L (ref 22–32)
Calcium: 8.2 mg/dL — ABNORMAL LOW (ref 8.9–10.3)
Chloride: 105 mmol/L (ref 98–111)
Creatinine, Ser: 0.91 mg/dL (ref 0.61–1.24)
GFR, Estimated: >60 mL/min (ref >60)
Glucose, Bld: 152 mg/dL — ABNORMAL HIGH (ref 70–99)
Potassium: 3.7 mmol/L (ref 3.5–5.1)
Sodium: 134 mmol/L — ABNORMAL LOW (ref 135–145)
Total Bilirubin: 4.2 mg/dL — ABNORMAL HIGH (ref 0.3–1.2)
Total Protein: 5 g/dL — ABNORMAL LOW (ref 6.5–8.1)

## 2022-09-02 LAB — URINE CULTURE: Culture: NO GROWTH

## 2022-09-02 LAB — CBC
HCT: 27.4 % — ABNORMAL LOW (ref 39.0–52.0)
Hemoglobin: 8.5 g/dL — ABNORMAL LOW (ref 13.0–17.0)
MCH: 30.9 pg (ref 26.0–34.0)
MCHC: 31 g/dL (ref 30.0–36.0)
MCV: 99.6 fL (ref 80.0–100.0)
Platelets: 195 10*3/uL (ref 150–400)
RBC: 2.75 MIL/uL — ABNORMAL LOW (ref 4.22–5.81)
RDW: 18.4 % — ABNORMAL HIGH (ref 11.5–15.5)
WBC: 12.6 10*3/uL — ABNORMAL HIGH (ref 4.0–10.5)
nRBC: 0.3 % — ABNORMAL HIGH (ref 0.0–0.2)

## 2022-09-02 LAB — GLUCOSE, CAPILLARY
Glucose-Capillary: 126 mg/dL — ABNORMAL HIGH (ref 70–99)
Glucose-Capillary: 139 mg/dL — ABNORMAL HIGH (ref 70–99)
Glucose-Capillary: 145 mg/dL — ABNORMAL HIGH (ref 70–99)
Glucose-Capillary: 151 mg/dL — ABNORMAL HIGH (ref 70–99)
Glucose-Capillary: 200 mg/dL — ABNORMAL HIGH (ref 70–99)

## 2022-09-02 LAB — APTT: aPTT: 32 s (ref 24–36)

## 2022-09-02 LAB — PHOSPHORUS
Phosphorus: 2.4 mg/dL — ABNORMAL LOW (ref 2.5–4.6)
Phosphorus: 2.3 mg/dL — ABNORMAL LOW (ref 2.5–4.6)

## 2022-09-02 LAB — PROTIME-INR
INR: 1.3 — ABNORMAL HIGH (ref 0.8–1.2)
Prothrombin Time: 16.8 s — ABNORMAL HIGH (ref 11.4–15.2)

## 2022-09-02 LAB — LACTATE DEHYDROGENASE: LDH: 407 U/L — ABNORMAL HIGH (ref 98–192)

## 2022-09-02 LAB — HEPARIN LEVEL (UNFRACTIONATED): Heparin Unfractionated: <0.1 [IU]/mL — ABNORMAL LOW (ref 0.30–0.70)

## 2022-09-02 MED ORDER — ORAL CARE MOUTH RINSE
15.0000 mL | OROMUCOSAL | Status: DC
  Administered 2022-09-02 (×2): 15 mL via OROMUCOSAL

## 2022-09-02 MED ORDER — ORAL CARE MOUTH RINSE
15.0000 mL | OROMUCOSAL | Status: DC
  Administered 2022-09-02 – 2022-09-27 (×84): 15 mL via OROMUCOSAL

## 2022-09-02 MED ORDER — POTASSIUM CHLORIDE 10 MEQ/50ML IV SOLN
10.0000 meq | INTRAVENOUS | Status: AC
  Administered 2022-09-02 (×3): 10 meq via INTRAVENOUS
  Filled 2022-09-02 (×3): qty 50

## 2022-09-02 MED ORDER — HEPARIN (PORCINE) 25000 UT/250ML-% IV SOLN
500.0000 [IU]/h | INTRAVENOUS | Status: AC
  Administered 2022-09-02 – 2022-09-14 (×8): 500 [IU]/h via INTRAVENOUS
  Filled 2022-09-02 (×8): qty 250

## 2022-09-02 MED ORDER — FUROSEMIDE 10 MG/ML IJ SOLN
40.0000 mg | Freq: Two times a day (BID) | INTRAMUSCULAR | Status: DC
  Administered 2022-09-02 (×2): 40 mg via INTRAVENOUS
  Filled 2022-09-02 (×2): qty 4

## 2022-09-02 MED ORDER — ORAL CARE MOUTH RINSE: 15.0000 mL | OROMUCOSAL | Status: DC | PRN

## 2022-09-02 MED ORDER — POTASSIUM CHLORIDE CRYS ER 20 MEQ PO TBCR
40.0000 meq | EXTENDED_RELEASE_TABLET | ORAL | Status: AC
  Administered 2022-09-02 (×2): 40 meq via ORAL
  Filled 2022-09-02 (×2): qty 2

## 2022-09-02 MED ORDER — SILDENAFIL CITRATE 20 MG PO TABS
40.0000 mg | ORAL_TABLET | Freq: Three times a day (TID) | ORAL | Status: DC
  Administered 2022-09-02 – 2022-09-05 (×11): 40 mg
  Filled 2022-09-02 (×12): qty 2

## 2022-09-02 NOTE — Progress Notes (Signed)
Patient ID: Charles Holmes, male   DOB: 06/21/1965, 57 y.o.   MRN: [id number]  Weaned off iNO (started at 4 ppm) but increase in PA pressure to 60s.   - Restart iNO at 5 ppm, will need very slow wean.  - Increase sildenafil to 40 tid.   Marca Ancona 09/02/2022

## 2022-09-02 NOTE — Progress Notes (Signed)
Daily Progress Note   Patient Name: Charles Holmes       Date: 09/02/2022 DOB: 1965-07-02  Age: 57 y.o. MRN#: [id number] Attending Physician: Dolores Patty, MD Primary Care Physician: [name], PA-C Admit Date: 08/10/2022  Reason for Consultation/Follow-up: Establishing goals of care  Length of Stay: 23   Physical Exam Vitals and nursing note reviewed.  Constitutional:      General: He is awake.     Appearance: He is ill-appearing.  Pulmonary:     Effort: Pulmonary effort is normal.  Skin:    General: Skin is warm and dry.  Neurological:     Cranial Nerves: Facial asymmetry present.             Vital Signs: BP 96/84   Pulse (!) 101   Temp 98.4 F (36.9 C)   Resp 13   Ht 5\' 7"  (1.702 m)   Wt 87.4 kg   SpO2 100%   BMI 30.18 kg/m  SpO2: SpO2: 100 % O2 Device: O2 Device: High Flow Nasal Cannula O2 Flow Rate: O2 Flow Rate (L/min): 8 L/min  Intake/output summary:  Intake/Output Summary (Last 24 hours) at 09/02/2022 0945 Last data filed at 09/02/2022 0900 Gross per 24 hour  Intake 3096.93 ml  Output 2060 ml  Net 1036.93 ml   LBM: Last BM Date : 08/28/22 Baseline Weight: Weight: 77.1 kg Most recent weight: Weight: 87.4 kg    Patient Active Problem List   Diagnosis Date Noted   Middle cerebral artery embolism, 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   Chronic obstructive pulmonary disease (HCC) 08/23/2022   History of CVA (cerebrovascular accident) 08/11/2022   Paroxysmal atrial fibrillation (HCC) 08/11/2022   Iron deficiency anemia 08/11/2022   CHF (congestive heart failure) (HCC) 08/10/2022   Stroke (cerebrum) (HCC) 07/11/2022   Status post surgery 07/10/2022   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   Coronary artery disease involving native coronary artery of native heart  without angina pectoris 03/02/2020   Chronic systolic heart failure (HCC) 02/21/2020   Ventricular tachycardia (HCC) 02/19/2020   Ventricular tachycardia, sustained (HCC) 02/19/2020   AKI (acute kidney injury) (HCC) 12/19/2019   CAD (coronary artery disease) 12/19/2019   Hyperlipidemia with target LDL less than 70 12/19/2019   Wide-complex tachycardia 12/16/2019   Acute on chronic systolic CHF (congestive heart failure) (HCC) 12/16/2019  HTN (hypertension) 12/16/2019    Palliative Care Assessment & Plan   Patient Profile: 57 y.o. 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   Full assessment and workup; status post LVAD implant on [phone]. Postop day 1 code stroke called, CT significant for acute right MCA infarct, status post thrombectomy.   Today's Discussion: Extensive chart review has been completed prior to meeting with patient including labs, vital signs, imaging, progress/consult notes, orders, medications and available advance directive documents.   Patient is in bed with his eyes closed resting. He awakes easily. He states he has "felt better" than he does currently. He says yesterday was a lot for him with a lot of turning and moving which causes discomfort. Created space and opportunity for him to explore his thoughts and feelings on current medical status including the surgery and complication of the stroke. He admits he knew that there were potential risks with the operation, but he did not expect this. He is frustrated that he cannot eat or drink until reassessed by speech therapy. We discussed the need to ensure he can swallow safely-- he understands. Emotional support offered. We discussed the road  ahead for recovery and all that he has to look forward to as he improves.  He states his daughter is doing well but he asks I not call her today.  Recommendations/Plan: Partial code status- no CPR due to LVAD Full scope Continued PMT support  Code Status:    Code Status Orders  (From admission, onward)           Start     Ordered   08/29/22 1529  Limited resuscitation (code)  Continuous       Comments: Ventricular Assist Device in place.  Question Answer Comment  In the event of cardiac or respiratory ARREST: Initiate Code Blue, Call Rapid Response Yes   In the event of cardiac or respiratory ARREST: Perform CPR No   In the event of cardiac or respiratory ARREST: Perform Intubation/Mechanical Ventilation Yes   In the event of cardiac or respiratory ARREST: Use NIPPV/BiPAp only if indicated Yes   In the event of cardiac or respiratory ARREST: Administer ACLS medications if indicated Yes   In the event of cardiac or respiratory ARREST: Perform Defibrillation or Cardioversion if indicated Yes      08/29/22 1528            Care plan was discussed with bedside RN  Time spent: 35 minutes  Thank you for allowing the Palliative Medicine Team to assist in the care of this patient.   Sherryll Burger, NP  Please contact Palliative Medicine Team phone at [phone] for questions and concerns.

## 2022-09-02 NOTE — Progress Notes (Signed)
HeartMate 3 Rounding Note  POD #3 Implant HM-3  Subjective:   57 year old male with cardiac sarcoidosis, nonischemic cardiomyopathy, and advanced heart failure refractory to optimal medical therapy underwent HeartMate 3 implantation on August 13 and required preoperative support with intra-aortic balloon pump and inotropic therapy.  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  Intraoperatively the patient had moderate RV dysfunction and following VAD implantation there was some obstruction to the inflow through the mitral valve from posterior annular thickening/ hematoma which was not clearly defined as the inflow cannula was in good position and far removed from the mitral valve.  After application of a left atrial clip the inflow appeared to be improved.  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 Off anticoagulation now  due to mild hemorrhagic extension by f/u CT.  Stable day with repeat head CT showing improvement, low dose heparin started, inhaled NO weaned off successfully with PA pressures ok IV lasix dosing started u/o improved Chest tube output decreased and will remove tomorrow. Elevated postop bilirubin improved.  LVAD INTERROGATION:  HeartMate II LVAD:  Flow 4.4  liters/min, speed 5300 rpm, power 3.6, PI 4.1.  Controller intact.   Objective:    Vital Signs:   Temp:  [97.5 F (36.4 C)-98.6 F (37 C)] 98.2 F (36.8 C) (08/17 1700) Pulse Rate:  [91-208] 109 (08/17 1700) Resp:  [9-22] 15 (08/17 1700) BP: (84-122)/(58-98) 94/81 (08/17 1700) SpO2:  [94 %-100 %] 99 % (08/17 1700) Weight:  [87.4 kg] 87.4 kg (08/17 0600) Last BM Date : 08/28/22 Mean arterial Pressure 88-92  mm Hg  Intake/Output:  Exam  General -alert moving L side better, speech better HEENT:-No bleeding from mouth at extraction sits Neck: supple. JVP . Carotids without bruits. No lymphadenopathy or thryomegaly appreciated. Cor: Mechanical heart sounds with LVAD hum present. Lungs: clear Abdomen: soft, nontender, nondistended. No hepatosplenomegaly. No bruits or masses. Good bowel sounds. Extremities: no cyanosis, clubbing, rash, edema Neuro: alert & orientedx3, cranial nerves grossly intact. moves all 4 extremities . Affect pleasant  Telemetry: paced with PPM 90/min  Labs: Basic Metabolic Panel: Recent Labs  Lab 08/30/22 0258 08/30/22 0304 08/30/22 1657 08/30/22 1707 08/31/22 0310 08/31/22 0329 08/31/22 1322 08/31/22 1504 09/01/22 0500 09/01/22 1612 09/02/22 0527 09/02/22 1633  NA 133*   < > 135   < > 132* 135 135 134* 134*  --  134*  --   K 4.8   < > 4.4   < > 4.0 4.1 4.2 4.2 4.4  --  3.7  --   CL 103  --  103  --  103  --   --   --  104  --  105  --   CO2 23  --  22  --  23  --   --   --  22  --  24  --   GLUCOSE 118*  --  128*  --  119*  --   --   --  112*  --  152*  --   BUN 11  --  9  --  9  --   --   --  17  --  22*  --   CREATININE 0.84  --  0.85  --  0.88  --   --   --  0.74  --  0.91  --   CALCIUM 8.0*  --  8.0*  --  8.0*  --   --   --  8.4*  --  8.2*  --   MG 2.3  --  2.2  --  2.2  --   --   --  2.3 2.2 2.1 2.0  PHOS 3.1  --   --   --  2.8  --   --   --  3.1 2.7 2.4* 2.3*   < > = values in this interval not displayed.    Liver Function Tests: Recent Labs  Lab 08/27/22 0446 08/30/22 0258 08/31/22 0310 09/01/22 0500 09/02/22 0527  AST 27 218* 96* 73* 65*  ALT 38 45* 30 23 27   ALKPHOS 73 46 41 58 97  BILITOT 1.0 4.4* 4.6* 5.1* 4.2*  PROT 5.3* 5.1* 4.9* 5.3* 5.0*  ALBUMIN 2.9* 3.2* 2.7* 2.7* 2.4*   No results for input(s): "LIPASE", "AMYLASE" in the last 168 hours. No results for input(s): "AMMONIA" in the last 168 hours.  CBC: Recent Labs  Lab  08/30/22 0258 08/30/22 0304 08/30/22 1657 08/30/22 1707 08/31/22 0310 08/31/22 0329 08/31/22 1322 08/31/22 1504 09/01/22 0500 09/02/22 0527  WBC 11.6*  --  12.1*  --  13.7*  --   --   --  19.6* 12.6*  NEUTROABS 9.9*  --   --   --  11.0*  --   --   --   --   --   HGB 9.0*   < > 8.4*   < > 7.9* 8.2* 9.5* 9.5* 9.5* 8.5*  HCT 27.3*   < > 25.9*   < > 24.8* 24.0* 28.0* 28.0* 30.0* 27.4*  MCV 96.8  --  100.0  --  100.0  --   --   --  99.3 99.6  PLT 167  --  144*  --  139*  --   --   --  174 195   < > = values in this interval not displayed.    INR: Recent Labs  Lab 08/29/22 1535 08/30/22 0258 08/31/22 0310 09/01/22 0500 09/02/22 0527  INR 1.4* 1.4* 1.6* 1.5* 1.3*    Other results: EKG:   Imaging: CT HEAD WO CONTRAST ( )  Result Date: 09/02/2022 CLINICAL DATA:  Stroke, follow up signs of cerebral hemorrhage. EXAM: CT HEAD WITHOUT CONTRAST TECHNIQUE: Contiguous axial images were obtained from the base of the skull through the vertex without intravenous contrast. RADIATION DOSE REDUCTION: This exam was performed according to the departmental dose-optimization program which includes automated exposure control, adjustment of the mA and/or kV according to patient size and/or use of iterative reconstruction technique. COMPARISON:  Head CT 08/31/2022. 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 No new loss of gray-white differentiation. No hydrocephalus or midline shift. Basilar cisterns are patent. Vascular: No hyperdense vessel or unexpected calcification. Skull: No calvarial fracture or suspicious bone lesion. Skull base is unremarkable. Sinuses/Orbits: No acute findings. Other: None. 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 No hydrocephalus or midline shift. Electronically Signed   By: Orvan Falconer M.D.    On: 09/02/2022 11:15   DG Chest Port 1 View  Result Date: 09/02/2022 CLINICAL DATA:  Left ventricular assist device. EXAM: PORTABLE CHEST 1 VIEW COMPARISON:  09/01/2022 FINDINGS: Left-sided pacemaker unchanged. Left ventricular assist device  projects over the left heart border unchanged. Enteric tube courses through the region of the stomach and off the film as tip is not visualized. Right IJ Swan-Ganz catheter has tip over the proximal right pulmonary artery in the midline. Bilateral chest tubes unchanged. Lungs are adequately inflated without lobar consolidation or effusion. No pneumothorax. Minimal prominence of the central pulmonary vessels which may be due to minimal vascular congestion. Cardiomediastinal silhouette and remainder of the exam is unchanged. Old right-sided rib fractures. IMPRESSION: 1. Possible minimal vascular congestion, otherwise no acute cardiopulmonary disease. 2. Support apparatus as described. Electronically Signed   By: Elberta Fortis M.D.   On: 09/02/2022 10:04   DG Abd Portable 1V  Result Date: 09/01/2022 CLINICAL DATA:  Feeding tube placement EXAM: PORTABLE ABDOMEN - 1 VIEW COMPARISON:  CT abdomen/pelvis 08/11/2022 FINDINGS: The enteric catheter tip is in the stomach. There is a nonobstructive bowel gas pattern in the imaged abdomen. There is no definite pneumoperitoneum. Support apparatus in the chest appears stable. IMPRESSION: Enteric catheter tip in the stomach. Electronically Signed   By: Lesia Hausen M.D.   On: 09/01/2022 13:56   DG Chest Port 1 View  Result Date: 09/01/2022 CLINICAL DATA:  History of LVAD EXAM: PORTABLE CHEST 1 VIEW COMPARISON:  Chest radiograph dated 08/31/2022 FINDINGS: Lines/tubes: Interval extubation and removal of enteric catheter. Right IJ PA catheter tip is obscured by the median sternotomy wire. Similar position of bilateral pleural catheters. Left chest wall ICD leads project over the right atrium and ventricle. Similar position of LVAD.  Lungs: Persistent bilateral patchy and linear opacities. Pleura: No pneumothorax or pleural effusion. Heart/mediastinum: Similar mildly enlarged cardiomediastinal silhouette. Bones: Median sternotomy wires are nondisplaced. IMPRESSION: 1. Interval extubation and removal of enteric catheter. 2. Persistent bilateral patchy and linear opacities, likely atelectasis. 3. Similar cardiomegaly. Electronically Signed   By: Agustin Cree M.D.   On: 09/01/2022 08:28     Medications:     Scheduled Medications:  sodium chloride   Intravenous Once   acetaminophen  1,000 mg Oral Q6H   Or   acetaminophen (TYLENOL) oral liquid 160 mg/5 mL  1,000 mg Per Tube Q6H   atorvastatin  80 mg Per Tube Daily   bisacodyl  10 mg Rectal Daily   Chlorhexidine Gluconate Cloth  6 each Topical Q0600   dorzolamide-timolol  1 drop Both Eyes BID   feeding supplement (PROSource TF20)  60 mL Per Tube Daily   furosemide  40 mg Intravenous BID   insulin aspart  0-24 Units Subcutaneous Q6H   latanoprost  1 drop Both Eyes QHS   metoCLOPramide (REGLAN) injection  10 mg Intravenous Q6H   mexiletine  250 mg Per Tube BID   mometasone-formoterol  2 puff Inhalation BID   multivitamin with minerals  1 tablet Per Tube Daily   mouth rinse  15 mL Mouth Rinse 4 times per day   pantoprazole (PROTONIX) IV  40 mg Intravenous QHS   polyethylene glycol  17 g Per Tube Daily   sildenafil  40 mg Per Tube TID   sodium chloride flush  3 mL Intravenous Q12H   sodium chloride flush  3 mL Intravenous Q12H   umeclidinium bromide  1 puff Inhalation Daily    Infusions:  sodium chloride Stopped (08/30/22 0516)   sodium chloride Stopped (09/01/22 2027)   sodium chloride     sodium chloride Stopped (09/01/22 2043)   sodium chloride     sodium chloride     ceFEPime (MAXIPIME) IV 2  g (09/02/22 1752)   epinephrine 3 mcg/min (09/02/22 1700)   feeding supplement (VITAL 1.5 CAL) 40 mL/hr at 09/02/22 1700   heparin 500 Units/hr (09/02/22 1700)   lactated  ringers     lactated ringers     lactated ringers 20 mL/hr at 09/02/22 1700   milrinone 0.375 mcg/kg/min (09/02/22 1700)   vancomycin Stopped (09/02/22 1113)    PRN Medications: sodium chloride, sodium chloride, acetaminophen, albuterol, dextrose, hydrALAZINE, lactated ringers, midazolam, morphine injection, morphine injection, ondansetron (ZOFRAN) IV, mouth rinse, oxyCODONE, sodium chloride flush, sodium chloride flush, traMADol   Assessment:  57 year old patient with sarcoidosis, COPD history of stroke  and nonischemic cardiomyopathy with preoperative cardiogenic shock requiring balloon pump and inotropic therapy.  Postoperative  right middle cerebral artery CVA treated with neuro-- radiology invention,   Postop RV dysfunction req inhaled NO and inotropes with incresad bilirubin 1.2>> 4.9 Now off inhaled NO and bilirubin improving Plan/Discussion:   RV dysfunction status post VAD implant-continue low-dose epinephrine  Postoperative right middle cerebral CVA.  Initial hemorrhagic extension was mild and has started to improve. Low dose heparin started  Swallow function improved by FEES- on sips  I reviewed the LVAD parameters from today, and compared the results to the patient's prior recorded data.  No programming changes were made.  The LVAD is functioning within specified parameters.  The patient performs LVAD self-test daily.  LVAD interrogation was negative for any significant power changes, alarms or PI events/speed drops.  LVAD equipment check completed and is in good working order.  Back-up equipment present.   LVAD education done on emergency procedures and precautions and reviewed exit site care.  Length of Stay: [address]  Lovett Sox 09/02/2022, 5:58 PM

## 2022-09-02 NOTE — Progress Notes (Signed)
ANTICOAGULATION CONSULT NOTE  Pharmacy Consult for heparin Indication:  LVAD  Allergies  Allergen Reactions   Amiodarone     Severe tremors   Percocet [Oxycodone-Acetaminophen] Itching    Patient Measurements: Height: 5\' 7"  (170.2 cm) Weight: 87.4 kg (192 lb 10.9 oz) IBW/kg (Calculated) : 66.1 Heparin Dosing Weight: 87kg  Vital Signs: Temp: 98.4 F (36.9 C) (08/17 1900) Temp Source: Core (08/17 1600) BP: 94/80 (08/17 1800) Pulse Rate: 110 (08/17 1900)  Labs: Recent Labs    08/31/22 0310 08/31/22 0329 08/31/22 1504 09/01/22 0500 09/02/22 0527 09/02/22 1831  HGB 7.9*   < > 9.5* 9.5* 8.5*  --   HCT 24.8*   < > 28.0* 30.0* 27.4*  --   PLT 139*  --   --  174 195  --   APTT 35  --   --  34 32  --   LABPROT 19.0*  --   --  18.5* 16.8*  --   INR 1.6*  --   --  1.5* 1.3*  --   HEPARINUNFRC  --   --   --   --   --  <0.10*  CREATININE 0.88  --   --  0.74 0.91  --    < > = values in this interval not displayed.    Estimated Creatinine Clearance: 92.2 mL/min (by C-G formula based on SCr of 0.91 mg/dL).   Medical History: Past Medical History:  Diagnosis Date   CAD (coronary artery disease)    CHF (congestive heart failure) (HCC)    GERD (gastroesophageal reflux disease)    Hyperlipidemia    Hypertension    Systolic heart failure (HCC) 2021   LVEF 18%, RVEF 38% on cardiac MRI 12/19/2019. possible cardiac sarcoidosis.   Wide-complex tachycardia 2021   wears LifeVest     Assessment: 57yoM on apixaban PTA for hx AF admitted for LVAD workup. Pt s/p HM3 implant on 8/13 c/b acute CVA postop. Head CT today stable, ok to start low dose heparin per neuro. aPTT and INR wnl this am, CBC stable.  PM Update:  Heparin level undetectible as expected. No titration per MD. Day shift to reevaluate.  Goal of Therapy:  Heparin level <0.1 units/ml Monitor platelets by anticoagulation protocol: Yes   Plan:  Continue heparin 500 units/h   Daily heparin level  Thank you for  involving pharmacy in this patient's care.  Enos Fling, PharmD, BCPS  Clinical Pharmacist 09/02/2022 7:26 PM

## 2022-09-02 NOTE — Plan of Care (Signed)
  Problem: Education: Goal: Understanding of CV disease, CV risk reduction, and recovery process will improve Outcome: Progressing   Problem: Activity: Goal: Ability to return to baseline activity level will improve Outcome: Progressing   Problem: Cardiovascular: Goal: Ability to achieve and maintain adequate cardiovascular perfusion will improve Outcome: Progressing   

## 2022-09-02 NOTE — Progress Notes (Signed)
ANTICOAGULATION CONSULT NOTE  Pharmacy Consult for heparin Indication:  LVAD  Allergies  Allergen Reactions   Amiodarone     Severe tremors   Percocet [Oxycodone-Acetaminophen] Itching    Patient Measurements: Height: 5\' 7"  (170.2 cm) Weight: 87.4 kg (192 lb 10.9 oz) IBW/kg (Calculated) : 66.1 Heparin Dosing Weight: 87kg  Vital Signs: Temp: 98.2 F (36.8 C) (08/17 1100) Temp Source: Core (08/17 0800) BP: 92/77 (08/17 1100) Pulse Rate: 109 (08/17 1100)  Labs: Recent Labs    08/31/22 0310 08/31/22 0329 08/31/22 1504 09/01/22 0500 09/02/22 0527  HGB 7.9*   < > 9.5* 9.5* 8.5*  HCT 24.8*   < > 28.0* 30.0* 27.4*  PLT 139*  --   --  174 195  APTT 35  --   --  34 32  LABPROT 19.0*  --   --  18.5* 16.8*  INR 1.6*  --   --  1.5* 1.3*  CREATININE 0.88  --   --  0.74 0.91   < > = values in this interval not displayed.    Estimated Creatinine Clearance: 92.2 mL/min (by C-G formula based on SCr of 0.91 mg/dL).   Medical History: Past Medical History:  Diagnosis Date   CAD (coronary artery disease)    CHF (congestive heart failure) (HCC)    GERD (gastroesophageal reflux disease)    Hyperlipidemia    Hypertension    Systolic heart failure (HCC) 2021   LVEF 18%, RVEF 38% on cardiac MRI 12/19/2019. possible cardiac sarcoidosis.   Wide-complex tachycardia 2021   wears LifeVest     Assessment: 57yoM on apixaban PTA for hx AF admitted for LVAD workup. Pt s/p HM3 implant on 8/13 c/b acute CVA postop. Head CT today stable, ok to start low dose heparin per neuro. aPTT and INR wnl this am, CBC stable.  Goal of Therapy:  Heparin level <0.1 units/ml Monitor platelets by anticoagulation protocol: Yes   Plan:  Heparin 500 units/h no bolus Check heparin level in 6h to ensure not high  Fredonia Highland, PharmD, BCPS, [facility] Clinical Pharmacist [phone] Please check AMION for all [facility] numbers 09/02/2022

## 2022-09-02 NOTE — Progress Notes (Signed)
Patient ID: Charles Holmes, male   DOB: [birth year], 57 y.o.   MRN: [id number]     Advanced Heart Failure Rounding Note  PCP-Cardiologist: Norman Herrlich, MD  [facility] Surgery: Dr. Gala Romney   Subjective:    7/25: Admit with cardiogenic shock. Started milrinone and NE. 8/6 S/P 13 teeth extractions  8/9 IABP placed 8/13 S/p HM III LVAD implant + clipping left atrial appendage d/t severe thickening and invagination of mitral valve annulus impeding flows 8/14 Left-sided hemiplegia. CT head with acute R MCA infarct. IR for thrombectomy. CT head with small to moderate area of hemorrhagic conversion 8/15 extubated   Post Op Day #4  Remains on 0.375 milrinone + 3 Epi. iNO at 4.  Sildenafil 20 tid started.  No Lasix yesterday.   Swan: CVP 10-12 PA 36/15 CI 2.16 Co-ox 63%  Awake/alert, getting tube feeds.  Wants to eat.  Denies dyspnea.    On empiric vancomycin/cefepime.   LVAD Interrogation HM III: Speed: 5300 Flow: 4.3 PI: 4.4 Power: 3.8. No PI events over the last day    Objective:   Weight Range: 87.4 kg Body mass index is 30.18 kg/m.   Vital Signs:   Temp:  [97.5 F (36.4 C)-99.3 F (37.4 C)] 97.7 F (36.5 C) (08/17 0600) Pulse Rate:  [88-208] 178 (08/17 0600) Resp:  [9-22] 10 (08/17 0600) BP: (82-124)/(58-98) 100/89 (08/17 0500) SpO2:  [94 %-100 %] 100 % (08/17 0600) FiO2 (%):  [50 %] 50 % (08/16 1600) Weight:  [87.4 kg] 87.4 kg (08/17 0600) Last BM Date : 08/28/22  Weight change: Filed Weights   08/31/22 0500 09/01/22 0600 09/02/22 0600  Weight: 86.7 kg 86.6 kg 87.4 kg    Intake/Output:   Intake/Output Summary (Last 24 hours) at 09/02/2022 0812 Last data filed at 09/02/2022 0600 Gross per 24 hour  Intake 2856.15 ml  Output 1525 ml  Net 1331.15 ml   Physical Exam  CVP 12 General: Well appearing this am. NAD.  HEENT: Normal. Neck: Supple, JVP 10 cm. Carotids OK.  Cardiac:  Mechanical heart sounds with LVAD hum present.  Lungs:  CTAB, normal effort.  Abdomen:  NT,  ND, no HSM. No bruits or masses. +BS  LVAD exit site: Well-healed and incorporated. Dressing dry and intact. No erythema or drainage. Stabilization device present and accurately applied. Driveline dressing changed daily per sterile technique. Extremities:  Warm and dry. No cyanosis, clubbing, rash, or edema.  Neuro:  Alert & oriented x 3. Cranial nerves grossly intact. Moves all 4 extremities w/o difficulty. Affect pleasant     Telemetry     V paced 90s (personally reviewed)   Labs    CBC Recent Labs    08/31/22 0310 08/31/22 0329 09/01/22 0500 09/02/22 0527  WBC 13.7*  --  19.6* 12.6*  NEUTROABS 11.0*  --   --   --   HGB 7.9*   < > 9.5* 8.5*  HCT 24.8*   < > 30.0* 27.4*  MCV 100.0  --  99.3 99.6  PLT 139*  --  174 195   < > = values in this interval not displayed.    Basic Metabolic Panel Recent Labs    95/62/13 0500 09/01/22 1612 09/02/22 0527  NA 134*  --  134*  K 4.4  --  3.7  CL 104  --  105  CO2 22  --  24  GLUCOSE 112*  --  152*  BUN 17  --  22*  CREATININE 0.74  --  0.91  CALCIUM 8.4*  --  8.2*  MG 2.3 2.2 2.1  PHOS 3.1 2.7 2.4*    BNP: BNP (last 3 results) Recent Labs    07/26/22 1211 08/10/22 1142 08/30/22 0258  BNP 1,723.6* 1,905.0* 922.3*     Imaging    DG Abd Portable 1V  Result Date: 09/01/2022 CLINICAL DATA:  Feeding tube placement EXAM: PORTABLE ABDOMEN - 1 VIEW COMPARISON:  CT abdomen/pelvis 08/11/2022 FINDINGS: The enteric catheter tip is in the stomach. There is a nonobstructive bowel gas pattern in the imaged abdomen. There is no definite pneumoperitoneum. Support apparatus in the chest appears stable. IMPRESSION: Enteric catheter tip in the stomach. Electronically Signed   By: Lesia Hausen M.D.   On: 09/01/2022 13:56     Medications:     Scheduled Medications:  sodium chloride   Intravenous Once   acetaminophen  1,000 mg Oral Q6H   Or   acetaminophen (TYLENOL) oral liquid 160 mg/5 mL  1,000 mg Per Tube Q6H   aspirin EC  325  mg Oral Daily   Or   aspirin  324 mg Per Tube Daily   Or   aspirin  300 mg Rectal Daily   atorvastatin  80 mg Per Tube Daily   bisacodyl  10 mg Rectal Daily   Chlorhexidine Gluconate Cloth  6 each Topical Q0600   dorzolamide-timolol  1 drop Both Eyes BID   feeding supplement (PROSource TF20)  60 mL Per Tube Daily   furosemide  40 mg Intravenous BID   insulin aspart  0-24 Units Subcutaneous Q6H   latanoprost  1 drop Both Eyes QHS   metoCLOPramide (REGLAN) injection  10 mg Intravenous Q6H   mexiletine  250 mg Per Tube BID   mometasone-formoterol  2 puff Inhalation BID   multivitamin with minerals  1 tablet Per Tube Daily   mouth rinse  15 mL Mouth Rinse 4 times per day   pantoprazole (PROTONIX) IV  40 mg Intravenous QHS   polyethylene glycol  17 g Per Tube Daily   potassium chloride  40 mEq Oral Q4H   sildenafil  20 mg Per Tube TID   sodium chloride flush  3 mL Intravenous Q12H   sodium chloride flush  3 mL Intravenous Q12H   umeclidinium bromide  1 puff Inhalation Daily    Infusions:  sodium chloride Stopped (08/30/22 0516)   sodium chloride Stopped (09/01/22 2027)   sodium chloride     sodium chloride Stopped (09/01/22 2043)   sodium chloride     sodium chloride     ceFEPime (MAXIPIME) IV Stopped (09/02/22 0230)   epinephrine 3 mcg/min (09/02/22 0600)   feeding supplement (VITAL 1.5 CAL) 30 mL/hr at 09/02/22 0600   lactated ringers     lactated ringers     lactated ringers 20 mL/hr at 09/02/22 0600   milrinone 0.375 mcg/kg/min (09/02/22 0600)   potassium chloride 10 mEq (09/02/22 0803)   vancomycin Stopped (09/01/22 2225)    PRN Medications: sodium chloride, sodium chloride, acetaminophen, albuterol, dextrose, hydrALAZINE, lactated ringers, midazolam, morphine injection, morphine injection, ondansetron (ZOFRAN) IV, mouth rinse, mouth rinse, mouth rinse, oxyCODONE, sodium chloride flush, sodium chloride flush, traMADol   Patient Profile  Charles Holmes is a 57 y.o. 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 Admitted with cardiogenic shock, stabilized and underwent HM3 LVAD. Post implant course c/b acute CVA.   Assessment/Plan   1.  Acute on chronic Systolic  HF-->Cardiogenic Shock  - Diagnosed 11/2019. Presented with VT. LHC 70% LAD  - cMRI 12/21 concerning for sarcoid and EF 18%.  - PET 2/22 at [facility] EF 25% + active sarcoid - Echo 08/26/20 EF < 20% severely dilated LV RV mildly decreased.  - [employer] CRT-D upgrade in 06/08/21 - Echo 07/10/22: EF <20%, RV okay, mod pericardial effusion, mod Charles/TR - Admitted 07/25 with cardiogenic shock. - RHC: Nonobstructive CAD, severely elevated filling pressures and low Fick CO/CI (2.7/1.4) - 08/13 HM III LVAD implant + clipping LAA d/t severe thickening and invagination of mitral valve annulus impeding flows.  - Apical core sent to pathology to confirm diagnosis of cardiac sarcoid >> pending - Speed increased to 5300 on 08/14. On milrinone 0.375 + Epi 3 with CI of 2.16, LVAD flow 4.3.  - Have had difficulty weaning off iNO, now at 4 ppm.  Sildenafil 20 tid today.  Will try to wean off iNO today, continue current milrinone and epinephrine for RV support today and start weaning epinephrine tomorrow if remains stable off iNO.  - CVP 12. Start Lasix 40 mg IV bid and replace K.   - Repeat CT head today. If bleed stable, plan to start low-dose heparin per discussion with Neuro. - Continue ASA 325   2.  Acute stroke - Hx CVA 06/24 -Admitted 06/24 w/ R MCA stroke. S/p TPA and mechanical clot extraction. No residual deficits. Likely cardioembolic in setting of severe LV dysfunction. - Developed left sided weakness 08/14. CTA with R MCA infarct. Taken to IR for thrombectomy - Repeat CT head with small to moderate size hemorrhagic conversion. CT head this am stable. - D/w neuro, plan repeat head CT on 8/17  - C/w PT/OT. - Repeat swallow evaluation today, has  Cor-Trak.  - will need CIR once medically stable for d/c (team following)   3. 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 Continue mexilitene  - now s/p ICD.   4. CAD - LHC 12/07/19 70-% LAD, no intervention - LHC 8/24 non obstructive CAD.  - Continue statin. On aspirin for VAD.  5. Cardiac sarcoid - PET 2/22 at [facility] EF 25% + active sarcoid - Has completed prednisone.  - holding methotrexate w/ recent surgery  - Sent apical core to pathology to confirm diagnosis of cardiac sarcoid  6. Paroxsymal AT/AF - Currently in SR - holding Anticoagulation as above. If repeat head CT is stable, will start low dose heparin    7. AKI - suspect cardiorenal, improved w/ inotropic support - Creatinine 0.91 today.  - follow BMP w/ diuresis    8. Iron deficiency anemia/ Post-op anemia - recent T sat 15%, scheduled for OP feraheme. Will complete inpatient  - Transfused 1 u RBCs 8/15 - Hgb 8.5 today.     9. Pulmonary  - PFTs with severe obstructive defect, response to bronchodilator. FEV1 1.04L, FEV1/FVC 48% - extubated 8/15  - Low grade fever, on empiric vancomycin/cefepime.  WBCs 12.6 today.   10. Elevated LFTs Elevated T bili -Post-op -AST/ALT improved, T bili 4.6>>5.1>>4.2  -Follow  CRITICAL CARE Performed by: Marca Ancona  Total critical care time: 45 minutes  Critical care time was exclusive of separately billable procedures and treating other patients.  Critical care was necessary to treat or prevent imminent or life-threatening deterioration.  Critical care was time spent personally by me on the following activities: development of treatment plan with patient and/or surrogate as well as nursing,  discussions with consultants, evaluation of patient's response to treatment, examination of patient, obtaining history from patient or surrogate, ordering and performing treatments and interventions, ordering and review of laboratory studies, ordering and  review of radiographic studies, pulse oximetry and re-evaluation of patient's condition.   Length of Stay: 74  Marca Ancona, MD  09/02/2022, 8:12 AM  VAD Team Pager [phone] (7am - 7am)   Advanced Heart Failure Team Pager [phone] (M-F; 7a - 5p)  Please contact [facility] for night-coverage after hours (5p -7a ) and weekends on [email or web address]

## 2022-09-02 NOTE — Progress Notes (Signed)
STROKE TEAM PROGRESS NOTE   BRIEF HPI Charles Holmes is a 57 y.o. 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 He was noted to be moving all extremities and lift hem up of the bed around midnight. As sedation was being weaned in anticipation of eventual extubation in AM, he was noted to not be able to move his LUE and LLE. A code stroke was activated. R MCA M1 occlusion and received manual thrombectomy.    SIGNIFICANT HOSPITAL EVENTS 7/25: ED to Hospital admission 8/13: LVAD placement 8/14: R MCA M1 occlusion and manual thrombectomy 8/15: extubated  INTERIM HISTORY/SUBJECTIVE No family is at the bedside. Pt lying in bed, lethargic and  mildly sleepy but open eyes on voice and asked me when he can go home. Repeat head CT showed stable and regressed hemorrhagic conversion, will start heparin IV.   OBJECTIVE  CBC    Component Value Date/Time   WBC 12.6 (H) 09/02/2022 0527   RBC 2.75 (L) 09/02/2022 0527   HGB 8.5 (L) 09/02/2022 0527   HGB 14.3 05/31/2021 0906   HCT 27.4 (L) 09/02/2022 0527   HCT 42.6 05/31/2021 0906   PLT 195 09/02/2022 0527   PLT 203 05/31/2021 0906   MCV 99.6 09/02/2022 0527   MCV 98 (H) 05/31/2021 0906   MCH 30.9 09/02/2022 0527   MCHC 31.0 09/02/2022 0527   RDW 18.4 (H) 09/02/2022 0527   RDW 15.8 (H) 05/31/2021 0906   LYMPHSABS 0.3 (L) 08/31/2022 0310   LYMPHSABS 0.9 05/31/2021 0906   MONOABS 2.2 (H) 08/31/2022 0310   EOSABS 0.0 08/31/2022 0310   EOSABS 0.1 05/31/2021 0906   BASOSABS 0.0 08/31/2022 0310   BASOSABS 0.0 05/31/2021 0906    BMET    Component Value Date/Time   NA 134 (L) 09/02/2022 0527   NA 143 05/31/2021 0906   K 3.7 09/02/2022 0527   CL 105 09/02/2022 0527   CO2 24 09/02/2022 0527   GLUCOSE 152 (H) 09/02/2022 0527   BUN 22 (H) 09/02/2022 0527   BUN 12 05/31/2021 0906    CREATININE 0.91 09/02/2022 0527   CALCIUM 8.2 (L) 09/02/2022 0527   EGFR 80 05/31/2021 0906   GFRNONAA >60 09/02/2022 0527    IMAGING past 24 hours CT HEAD WO CONTRAST ( )  Result Date: 09/02/2022 CLINICAL DATA:  Stroke, follow up signs of cerebral hemorrhage. EXAM: CT HEAD WITHOUT CONTRAST TECHNIQUE: Contiguous axial images were obtained from the base of the skull through the vertex without intravenous contrast. RADIATION DOSE REDUCTION: This exam was performed according to the departmental dose-optimization program which includes automated exposure control, adjustment of the mA and/or kV according to patient size and/or use of iterative reconstruction technique. COMPARISON:  Head CT 08/31/2022. 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 No new loss of gray-white differentiation. No hydrocephalus or midline shift. Basilar cisterns are patent. Vascular: No hyperdense vessel or unexpected calcification. Skull: No calvarial fracture or suspicious bone lesion. Skull base is unremarkable. Sinuses/Orbits: No acute findings. Other: None. 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 No hydrocephalus or midline shift. Electronically Signed   By: Orvan Falconer M.D.   On: 09/02/2022 11:15   DG Chest Port 1 View  Result Date: 09/02/2022 CLINICAL DATA:  Left ventricular assist device. EXAM: PORTABLE  CHEST 1 VIEW COMPARISON:  09/01/2022 FINDINGS: Left-sided pacemaker unchanged. Left ventricular assist device projects over the left heart border unchanged. Enteric tube courses through the region of the stomach and off the film as tip is not visualized. Right IJ Swan-Ganz catheter has tip over the proximal right pulmonary artery in the midline. Bilateral chest tubes unchanged. Lungs are adequately inflated without lobar consolidation or  effusion. No pneumothorax. Minimal prominence of the central pulmonary vessels which may be due to minimal vascular congestion. Cardiomediastinal silhouette and remainder of the exam is unchanged. Old right-sided rib fractures. IMPRESSION: 1. Possible minimal vascular congestion, otherwise no acute cardiopulmonary disease. 2. Support apparatus as described. Electronically Signed   By: Elberta Fortis M.D.   On: 09/02/2022 10:04    Vitals:   09/02/22 1000 09/02/22 1100 09/02/22 1200 09/02/22 1300  BP: 105/70 92/77 (!) 89/64 (!) 85/72  Pulse: (!) 106 (!) 109 (!) 104 94  Resp: 11 11 19 10   Temp: 98.1 F (36.7 C) 98.2 F (36.8 C) 97.9 F (36.6 C) 98.1 F (36.7 C)  TempSrc:   Core   SpO2: 94% 98% 97% 99%  Weight:      Height:         PHYSICAL EXAM General:  awake and cooperative.  CV: Regular rate and rhythm on monitor Respiratory:  normal effort.   NEURO:  Lethargic and mildly sleepy, eyes open on voice, orientated to age, place, time. No aphasia, fluent language, following all simple commands, mild dysarthria. Able to name and repeat and read. No gaze palsy, tracking bilaterally, visual field full, PERRL. Left facial droop. Tongue midline. LUE 3/5 and RUE 4+/5. LLE 2+/5 and RLE 3/5. Sensation symmetrical bilaterally, Right FTN intact grossly, gait not tested.    ASSESSMENT/PLAN  Stroke - right MCA infarcts with hemorrhagic transformation due to right M1 occlusion s/p IR with TICI3 Etiology:  cardioembolic source (PAF, LVAD, cardiomyopathy with low EF) Code Stroke CT head Acute infarct R temporal cortex and basal ganglia. No acute hemorrhage. ASPECTS 7.    CTA head & neck emergent R M1 occlusion.  CTP 12/102 S/p IR with TICI3 Repeat CT 8/14 Hemorrhagic transformation of evolving infarct in the right caudate nucleus with small volume intraventricular extension of hemorrhage. Repeat CT 8/15 unchanged extent of hemorrhage in right MCA distribution Repeat CT head 8/17 decreased  intraparenchymal and intraventricular hemorrhage. No hydrocephalus or midline shift. Echo 8/8 EF 15%, LV severely decreased function, with global hypokinesis LDL 51 HgbA1c 5.5 VTE prophylaxis - SCDs Eliquis (apixaban) daily prior to admission, now on heparin IV given stable CT. Therapy recommendations:  CIR Disposition:  pending  Hx of Stroke/TIA 06/2022: admitted for left sided weakness, CT no acute finding. S/p TPA. CTA head and neck showed right M1 occlusion s/p IR with TICI3. MRI right temporal, insular and BG infarcts. MRA showed right MCA patent. EF < 20%, LDL 59 and A1C 5.1. etiology likely due to cardiomyopathy with low EF, discharged on eliquis and lipitor 80  Atrial fibrillation Home Meds: eliquis 5 mg twice daily, carvedilol 3.125 mg twice daily Continue telemetry monitoring Now on heparin IV since CT stable  NICM CHF s/p ICD 02/2020 VT with cardiac sarcoidosis  EF 15% with LV severely decreased function, with global hypokinesis 08/29/22 LVAD placed Cardiology on board On milrinone infusion  Now on heparin IV   Hx of hypertension hypotension Home meds:  carvedilol 3.125 mg twice daily, furosemide 40 mg twice daily, spironolactone 12.5 mg once daily Labile and soft BPs  in the setting of heart failure Still on epinephrine Blood pressure goal: normotensive  Hyperlipidemia Home meds:  atorvastatin 80 mg once daily, resumed in hospital LDL 51, goal < 70 Continue statin at discharge  Dysphagia Patient has post-stroke dysphagia, SLP consulted NPO On TF @ 60cc Advance diet as tolerated  Other Stroke Risk Factors ETOH use, advised to drink no more than 2 drink(s) a day  Other Active Problems Acute respiratory insufficiency, extubated Leukocytosis, 13.7-> 19.6->12.6, cultures pending, no fever, on cefepime   Hospital day # 32   Marvel Plan, MD PhD Stroke Neurology 09/02/2022 2:13 PM  This patient is critically ill due to heart failure, right MCA infarct s/p  thrombectomy, hypotension and at significant risk of neurological worsening, death form cardiogenic shock, cardiac arrest, recurrent infarct, hemorrhagic conversion, seizure. This patient's care requires constant monitoring of vital signs, hemodynamics, respiratory and cardiac monitoring, review of multiple databases, neurological assessment, discussion with family, other specialists and medical decision making of high complexity. I spent 30 minutes of neurocritical care time in the care of this patient.    To contact Stroke Continuity provider, please refer to [email or web address]. After hours, contact General Neurology

## 2022-09-02 NOTE — Procedures (Signed)
Objective Swallowing Evaluation: Type of Study: FEES-Fiberoptic Endoscopic Evaluation of Swallow   Patient Details  Name: Charles Holmes MRN: [id number] Date of Birth: 18-Jul-1965  Today's Date: 09/02/2022 Time: SLP Start Time (ACUTE ONLY): 2841 -SLP Stop Time (ACUTE ONLY): 1000  SLP Time Calculation (min) (ACUTE ONLY): 42 min   Past Medical History:  Past Medical History:  Diagnosis Date   CAD (coronary artery disease)    CHF (congestive heart failure) (HCC)    GERD (gastroesophageal reflux disease)    Hyperlipidemia    Hypertension    Systolic heart failure (HCC) 2021   LVEF 18%, RVEF 38% on cardiac MRI 12/19/2019. possible cardiac sarcoidosis.   Wide-complex tachycardia 2021   wears LifeVest   Past Surgical History:  Past Surgical History:  Procedure Laterality Date   BIV UPGRADE N/A 06/07/2021   Procedure: BIV ICD UPGRADE;  Surgeon: Regan Lemming, MD;  Location: [facility] INVASIVE CV LAB;  Service: Cardiovascular;  Laterality: N/A;   CLIPPING OF ATRIAL APPENDAGE Left 08/29/2022   Procedure: CLIPPING OF ATRIAL APPENDAGE;  Surgeon: Lovett Sox, MD;  Location: [facility] OR;  Service: Open Heart Surgery;  Laterality: Left;   IABP INSERTION N/A 08/25/2022   Procedure: IABP Insertion;  Surgeon: Dolores Patty, MD;  Location: [facility] INVASIVE CV LAB;  Service: Cardiovascular;  Laterality: N/A;   ICD IMPLANT N/A 02/20/2020   Procedure: ICD IMPLANT;  Surgeon: Regan Lemming, MD;  Location: [facility] INVASIVE CV LAB;  Service: Cardiovascular;  Laterality: N/A;   INSERTION OF IMPLANTABLE LEFT VENTRICULAR ASSIST DEVICE N/A 08/29/2022   Procedure: INSERTION OF IMPLANTABLE LEFT VENTRICULAR ASSIST DEVICE;  Surgeon: Lovett Sox, MD;  Location: [facility] OR;  Service: Open Heart Surgery;  Laterality: N/A;   IR CT HEAD LTD  07/10/2022   IR CT HEAD LTD  08/30/2022   IR PERCUTANEOUS ART THROMBECTOMY/INFUSION INTRACRANIAL INC DIAG ANGIO  07/10/2022   IR PERCUTANEOUS ART THROMBECTOMY/INFUSION INTRACRANIAL INC  DIAG ANGIO  08/30/2022   IR US GUIDE VASC ACCESS LEFT  08/30/2022   IR US GUIDE VASC ACCESS RIGHT  07/10/2022   RADIOLOGY WITH ANESTHESIA N/A 07/10/2022   Procedure: RADIOLOGY WITH ANESTHESIA;  Surgeon: Radiologist, Medication, MD;  Location: [facility] OR;  Service: Radiology;  Laterality: N/A;   RADIOLOGY WITH ANESTHESIA N/A 08/30/2022   Procedure: IR WITH ANESTHESIA;  Surgeon: Julieanne Cotton, MD;  Location: [facility] OR;  Service: Radiology;  Laterality: N/A;   REMOVAL OF IMPELLA LEFT VENTRICULAR ASSIST DEVICE N/A 08/29/2022   Procedure: REMOVAL OF INTRA-AORTIC BALLON PUMP;  Surgeon: Lovett Sox, MD;  Location: [facility] OR;  Service: Open Heart Surgery;  Laterality: N/A;   RIGHT HEART CATH N/A 07/14/2022   Procedure: RIGHT HEART CATH;  Surgeon: Dolores Patty, MD;  Location: [facility] INVASIVE CV LAB;  Service: Cardiovascular;  Laterality: N/A;   RIGHT HEART CATH N/A 08/25/2022   Procedure: RIGHT HEART CATH;  Surgeon: Dolores Patty, MD;  Location: [facility] INVASIVE CV LAB;  Service: Cardiovascular;  Laterality: N/A;   RIGHT/LEFT HEART CATH AND CORONARY ANGIOGRAPHY N/A 12/16/2019   Procedure: RIGHT/LEFT HEART CATH AND CORONARY ANGIOGRAPHY;  Surgeon: Swaziland, Peter M, MD;  Location: [facility] INVASIVE CV LAB;  Service: Cardiovascular;  Laterality: N/A;   RIGHT/LEFT HEART CATH AND CORONARY ANGIOGRAPHY N/A 08/10/2022   Procedure: RIGHT/LEFT HEART CATH AND CORONARY ANGIOGRAPHY;  Surgeon: Dolores Patty, MD;  Location: [facility] INVASIVE CV LAB;  Service: Cardiovascular;  Laterality: N/A;   TEE WITHOUT CARDIOVERSION N/A 08/29/2022   Procedure: TRANSESOPHAGEAL ECHOCARDIOGRAM;  Surgeon: Lovett Sox, MD;  Location: [facility] OR;  Service: Open Heart Surgery;  Laterality: N/A;   TOOTH EXTRACTION N/A 08/22/2022   Procedure: DENTAL RESTORATION/EXTRACTIONS;  Surgeon: Ocie Doyne, DMD;  Location: [facility] OR;  Service: Oral Surgery;  Laterality: N/A;   HPI: Charles Holmes is 57 year old presented [facility]22 for acute on chronic systolic heart  failure. Multiple dental extractions 8/6. Pt had IABP placed on 08/25/22 for ongoing cardiogenic shock. Underwent placement of LVAD on 8/13. Developed lt sided weakness on 8/14. Rt MCA CVA. Underwent mechanical thrombectomy by IR. ETT 8/13-15.  PMH - CAD, CHF, HLD, HTN, systolic HF, ICD implant, rt CVA.  LVAD placement and new stroke R MCA M1.   Subjective: Pt awake, alert, pleasant, participative    Recommendations for follow up therapy are one component of a multi-disciplinary discharge planning process, led by the attending physician.  Recommendations may be updated based on patient status, additional functional criteria and insurance authorization.  Assessment / Plan / Recommendation     09/02/2022   10:00 AM  Clinical Impressions  Clinical Impression Patient presents with a mild oropharyngeal dysphagia. Oral phase prolonged with textures above pureed solids due to missing dentition but otherwise functional. Intermittently delayed swallow initiation noted with resultant intermittent trace penetration of thin liquids, remaining above the vocal cords and clearing with cued throat clear and subsequent swallow. One time probable deep penetration and/or aspiraiton of thin liquids via multiple consecutive straw sips noted as evidenced by strong cough immediately post swallow. By the time the vocal cords/upper trachea were visualized, any possible penetrates/aspirates were already cleared from the airway. Mild pharyngeal residue noted post swallow with solids, suspect due to hesistant return of epiglottis to baseline position due to presence of NG tube. Residuals clear with spontaneous dry swallow. Recommend initiation of diet with precautions and close SLP f/u for tolerance and potential to advance.  SLP Visit Diagnosis Dysphagia, unspecified (R13.10)  Impact on safety and function Mild aspiration risk         09/02/2022   10:00 AM  Treatment Recommendations  Treatment Recommendations Therapy as  outlined in treatment plan below        09/02/2022   10:00 AM  Prognosis  Prognosis for improved oropharyngeal function Good       09/02/2022   10:00 AM  Diet Recommendations  SLP Diet Recommendations Dysphagia 1 (Puree) solids;Thin liquid  Liquid Administration via Cup;No straw  Medication Administration Crushed with puree  Compensations Slow rate;Small sips/bites;Clear throat intermittently  Postural Changes Seated upright at 90 degrees         09/02/2022   10:00 AM  Other Recommendations  Oral Care Recommendations Oral care BID  Follow Up Recommendations No SLP follow up  Functional Status Assessment Patient has had a recent decline in their functional status and demonstrates the ability to make significant improvements in function in a reasonable and predictable amount of time.       09/02/2022   10:00 AM  Frequency and Duration   Speech Therapy Frequency (ACUTE ONLY) min 2x/week  Treatment Duration 1 week         09/02/2022   10:00 AM  Oral Phase  Oral Phase Impaired  Oral - Puree WFL  Oral - Mech Soft --  Oral - Regular NT  Oral - Multi-Consistency NT  Oral - Pill NT       09/02/2022   10:00 AM  Pharyngeal Phase  Pharyngeal Phase Impaired  Pharyngeal- Thin Teaspoon NT  Pharyngeal- Thin Cup  Delayed swallow initiation-vallecula;Pharyngeal residue - pyriform;Penetration/Aspiration during swallow  Pharyngeal Material enters airway, remains ABOVE vocal cords and not ejected out  Pharyngeal- Thin Straw Delayed swallow initiation-vallecula;Pharyngeal residue - pyriform;Penetration/Aspiration during swallow  Pharyngeal Material enters airway, passes BELOW cords then ejected out  Pharyngeal- Puree Pharyngeal residue - valleculae;Pharyngeal residue - pyriform  Pharyngeal Material does not enter airway  Pharyngeal- Mechanical Soft Pharyngeal residue - valleculae  Pharyngeal Material does not enter airway  Pharyngeal- Regular NT  Pharyngeal- Multi-consistency NT   Pharyngeal- Pill NT        09/02/2022   10:00 AM  Cervical Esophageal Phase   Cervical Esophageal Phase [facility]    Debralee Braaksma MA, CCC-SLP  Antwoin Lackey Meryl 09/02/2022, 10:14 AM

## 2022-09-02 NOTE — Progress Notes (Signed)
VAD dressing change: Existing VAD dressing removed and site care performed using sterile technique. Drive line exit site cleaned with Chlora prep applicators x 2, allowed to dry, and silver strip with gauze applied. Exit site healing and unincorporated, the velour is fully implanted at exit site. Scant amount of serosanguinous/dried bloody drainage. No redness, tenderness, foul odor or rash noted. 2 sutures in place. Drive line anchor secure. Continue daily dressing changes. Next dressing change due 09/03/22 by VAD coordinator or nurse champion only.

## 2022-09-02 NOTE — Progress Notes (Signed)
Nitric turned off at this time per Dr. Alford Highland order.

## 2022-09-03 ENCOUNTER — Inpatient Hospital Stay (HOSPITAL_COMMUNITY): Payer: Medicare Other

## 2022-09-03 DIAGNOSIS — I639 Cerebral infarction, unspecified: Secondary | ICD-10-CM | POA: Diagnosis not present

## 2022-09-03 DIAGNOSIS — I5023 Acute on chronic systolic (congestive) heart failure: Secondary | ICD-10-CM | POA: Diagnosis not present

## 2022-09-03 DIAGNOSIS — Z515 Encounter for palliative care: Secondary | ICD-10-CM | POA: Diagnosis not present

## 2022-09-03 LAB — COMPREHENSIVE METABOLIC PANEL
ALT: 34 U/L (ref 0–44)
AST: 68 U/L — ABNORMAL HIGH (ref 15–41)
Albumin: 2.3 g/dL — ABNORMAL LOW (ref 3.5–5.0)
Alkaline Phosphatase: 147 U/L — ABNORMAL HIGH (ref 38–126)
Anion gap: 7 (ref 5–15)
BUN: 25 mg/dL — ABNORMAL HIGH (ref 6–20)
CO2: 26 mmol/L (ref 22–32)
Calcium: 8.2 mg/dL — ABNORMAL LOW (ref 8.9–10.3)
Chloride: 103 mmol/L (ref 98–111)
Creatinine, Ser: 0.85 mg/dL (ref 0.61–1.24)
GFR, Estimated: >60 mL/min (ref >60)
Glucose, Bld: 183 mg/dL — ABNORMAL HIGH (ref 70–99)
Potassium: 4.4 mmol/L (ref 3.5–5.1)
Sodium: 136 mmol/L (ref 135–145)
Total Bilirubin: 2.7 mg/dL — ABNORMAL HIGH (ref 0.3–1.2)
Total Protein: 5.5 g/dL — ABNORMAL LOW (ref 6.5–8.1)

## 2022-09-03 LAB — MAGNESIUM: Magnesium: 1.8 mg/dL (ref 1.7–2.4)

## 2022-09-03 LAB — COOXEMETRY PANEL
Carboxyhemoglobin: 2.6 % — ABNORMAL HIGH (ref 0.5–1.5)
Carboxyhemoglobin: 1.6 % — ABNORMAL HIGH (ref 0.5–1.5)
Methemoglobin: <0.7 % (ref 0.0–1.5)
Methemoglobin: <0.7 % (ref 0.0–1.5)
O2 Saturation: 58.8 %
O2 Saturation: 54.4 %
Total hemoglobin: 9.7 g/dL — ABNORMAL LOW (ref 12.0–16.0)
Total hemoglobin: 9.6 g/dL — ABNORMAL LOW (ref 12.0–16.0)

## 2022-09-03 LAB — CBC
HCT: 30.7 % — ABNORMAL LOW (ref 39.0–52.0)
Hemoglobin: 9.4 g/dL — ABNORMAL LOW (ref 13.0–17.0)
MCH: 30.8 pg (ref 26.0–34.0)
MCHC: 30.6 g/dL (ref 30.0–36.0)
MCV: 100.7 fL — ABNORMAL HIGH (ref 80.0–100.0)
Platelets: 260 10*3/uL (ref 150–400)
RBC: 3.05 MIL/uL — ABNORMAL LOW (ref 4.22–5.81)
RDW: 18.1 % — ABNORMAL HIGH (ref 11.5–15.5)
WBC: 13.8 10*3/uL — ABNORMAL HIGH (ref 4.0–10.5)
nRBC: 1.5 % — ABNORMAL HIGH (ref 0.0–0.2)

## 2022-09-03 LAB — GLUCOSE, CAPILLARY
Glucose-Capillary: 132 mg/dL — ABNORMAL HIGH (ref 70–99)
Glucose-Capillary: 144 mg/dL — ABNORMAL HIGH (ref 70–99)
Glucose-Capillary: 156 mg/dL — ABNORMAL HIGH (ref 70–99)
Glucose-Capillary: 157 mg/dL — ABNORMAL HIGH (ref 70–99)
Glucose-Capillary: 167 mg/dL — ABNORMAL HIGH (ref 70–99)
Glucose-Capillary: 173 mg/dL — ABNORMAL HIGH (ref 70–99)
Glucose-Capillary: 180 mg/dL — ABNORMAL HIGH (ref 70–99)
Glucose-Capillary: 192 mg/dL — ABNORMAL HIGH (ref 70–99)

## 2022-09-03 LAB — HEPARIN LEVEL (UNFRACTIONATED): Heparin Unfractionated: <0.1 [IU]/mL — ABNORMAL LOW (ref 0.30–0.70)

## 2022-09-03 LAB — PROTIME-INR
INR: 1.2 (ref 0.8–1.2)
Prothrombin Time: 15.6 s — ABNORMAL HIGH (ref 11.4–15.2)

## 2022-09-03 LAB — LACTATE DEHYDROGENASE: LDH: 405 U/L — ABNORMAL HIGH (ref 98–192)

## 2022-09-03 LAB — PHOSPHORUS: Phosphorus: 1.7 mg/dL — ABNORMAL LOW (ref 2.5–4.6)

## 2022-09-03 LAB — APTT: aPTT: 34 s (ref 24–36)

## 2022-09-03 MED ORDER — ASPIRIN 325 MG PO TBEC
325.0000 mg | DELAYED_RELEASE_TABLET | Freq: Every day | ORAL | Status: DC
  Administered 2022-09-03 – 2022-09-04 (×2): 325 mg via ORAL
  Filled 2022-09-03 (×2): qty 1

## 2022-09-03 MED ORDER — POTASSIUM PHOSPHATES 15 MMOLE/5ML IV SOLN
15.0000 mmol | Freq: Once | INTRAVENOUS | Status: AC
  Administered 2022-09-03: 15 mmol via INTRAVENOUS
  Filled 2022-09-03: qty 5

## 2022-09-03 MED ORDER — MAGNESIUM SULFATE 2 GM/50ML IV SOLN
2.0000 g | Freq: Once | INTRAVENOUS | Status: AC
  Administered 2022-09-03: 2 g via INTRAVENOUS
  Filled 2022-09-03: qty 50

## 2022-09-03 MED ORDER — FUROSEMIDE 10 MG/ML IJ SOLN
60.0000 mg | Freq: Two times a day (BID) | INTRAMUSCULAR | Status: DC
  Administered 2022-09-03 (×2): 60 mg via INTRAVENOUS
  Filled 2022-09-03 (×2): qty 6

## 2022-09-03 NOTE — Progress Notes (Signed)
VAD dressing change: Existing VAD dressing removed and site care performed using sterile technique. Drive line exit site cleaned with Chlora prep applicators x 2, allowed to dry, and silver strip with gauze applied. Exit site healing and unincorporated, the velour is fully implanted at exit site. Scant amount of serosanguinous drainage. No redness, tenderness, foul odor at site. 2 sutures in place. Drive line anchor secure and left intact.  Patient complaint of some mild burning with chloraprep applicators but resolved quickly. Skin at tape borders slightly reddened and irritated. Attempted to rotate dressing slightly.  Continue daily dressing changes. Next dressing change due 09/04/22 by VAD coordinator or nurse champion only.

## 2022-09-03 NOTE — Plan of Care (Signed)
  Problem: Education: Goal: Understanding of CV disease, CV risk reduction, and recovery process will improve Outcome: Progressing   Problem: Activity: Goal: Ability to return to baseline activity level will improve Outcome: Progressing   Problem: Cardiovascular: Goal: Ability to achieve and maintain adequate cardiovascular perfusion will improve Outcome: Progressing Goal: Vascular access site(s) Level 0-1 will be maintained Outcome: Progressing   Problem: Health Behavior/Discharge Planning: Goal: Ability to safely manage health-related needs after discharge will improve Outcome: Progressing   Problem: Education: Goal: Knowledge of General Education information will improve Description: Including pain rating scale, medication(s)/side effects and non-pharmacologic comfort measures Outcome: Progressing   Problem: Health Behavior/Discharge Planning: Goal: Ability to manage health-related needs will improve Outcome: Progressing   Problem: Clinical Measurements: Goal: Ability to maintain clinical measurements within normal limits will improve Outcome: Progressing Goal: Will remain free from infection Outcome: Progressing Goal: Diagnostic test results will improve Outcome: Progressing Goal: Respiratory complications will improve Outcome: Progressing Goal: Cardiovascular complication will be avoided Outcome: Progressing   Problem: Activity: Goal: Risk for activity intolerance will decrease Outcome: Not Progressing   Problem: Nutrition: Goal: Adequate nutrition will be maintained Outcome: Progressing   Problem: Coping: Goal: Level of anxiety will decrease Outcome: Progressing   Problem: Elimination: Goal: Will not experience complications related to bowel motility Outcome: Progressing Goal: Will not experience complications related to urinary retention Outcome: Progressing   Problem: Pain Managment: Goal: General experience of comfort will improve Outcome:  Progressing   Problem: Safety: Goal: Ability to remain free from injury will improve Outcome: Progressing   Problem: Skin Integrity: Goal: Risk for impaired skin integrity will decrease Outcome: Progressing

## 2022-09-03 NOTE — Progress Notes (Signed)
Daily Progress Note   Patient Name: Charles Holmes       Date: 09/03/2022 DOB: 1965/04/27  Age: 57 y.o. MRN#: [id number] Attending Physician: Dolores Patty, MD Primary Care Physician: [name], PA-C Admit Date: 08/10/2022  Reason for Consultation/Follow-up: Establishing goals of care  Length of Stay: 24   Physical Exam Vitals and nursing note reviewed.  Constitutional:      General: He is awake.     Appearance: He is ill-appearing.  Pulmonary:     Effort: Pulmonary effort is normal.  Skin:    General: Skin is warm and dry.  Neurological:     Cranial Nerves: Facial asymmetry present.     Comments: Left facial droop             Vital Signs: BP 91/77 (BP Location: Right Arm)   Pulse (!) 108   Temp 98.8 F (37.1 C) (Core)   Resp 16   Ht 5\' 7"  (1.702 m)   Wt 85.6 kg   SpO2 93%   BMI 29.56 kg/m  SpO2: SpO2: 93 % O2 Device: O2 Device: Room Air O2 Flow Rate: O2 Flow Rate (L/min): 3 L/min  Intake/output summary:  Intake/Output Summary (Last 24 hours) at 09/03/2022 0831 Last data filed at 09/03/2022 0800 Gross per 24 hour  Intake 3963.59 ml  Output 4780 ml  Net -816.41 ml   LBM: Last BM Date : 08/28/22 Baseline Weight: Weight: 77.1 kg Most recent weight: Weight: 85.6 kg    Patient Active Problem List   Diagnosis Date Noted   Middle cerebral artery embolism, 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   Chronic obstructive pulmonary disease (HCC) 08/23/2022   History of CVA (cerebrovascular accident) 08/11/2022   Paroxysmal atrial fibrillation (HCC) 08/11/2022   Iron deficiency anemia 08/11/2022   CHF (congestive heart failure) (HCC) 08/10/2022   Stroke (cerebrum) (HCC) 07/11/2022   Status post surgery 07/10/2022   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   Coronary artery disease  involving native coronary artery of native heart without angina pectoris 03/02/2020   Chronic systolic heart failure (HCC) 02/21/2020   Ventricular tachycardia (HCC) 02/19/2020   Ventricular tachycardia, sustained (HCC) 02/19/2020   AKI (acute kidney injury) (HCC) 12/19/2019   CAD (coronary artery disease) 12/19/2019   Hyperlipidemia with target LDL less than 70 12/19/2019   Wide-complex tachycardia 12/16/2019   Acute  on chronic systolic CHF (congestive heart failure) (HCC) 12/16/2019   HTN (hypertension) 12/16/2019    Palliative Care Assessment & Plan   Patient Profile: 57 y.o. 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   Full assessment and workup; status post LVAD implant on [id number]. Postop day 1 code stroke called, CT significant for acute right MCA infarct, status post thrombectomy.   Today's Discussion: Extensive chart review has been completed prior to meeting with patient including labs, vital signs, imaging, progress/consult notes, orders, medications and available advance directive documents.   Patient is resting in bed. He reports pain at the incision site and in his abdomen. Patient is glad he is no longer NPO and wants the NG tube out. His mood seems more depressed than previous visits and he shut his eyes as we were talking. When I asked if there was anything I could do to help him he responded, "get me out of here." Offered emotional support and encouraged him to take thing one day at a time. Will continue to support.  9:45 update: Spoke with patient's daughter Dorathy Daft. Gave update from visit with patient today. Shared that I thought the patients mood seemed depressed. We discussed it may be a  combination of factors contributing and discussed what may improve his mood such as visits from loved ones. Kayla plans to visit today and will talk to their pastor about visiting this week. I will ask PMT chaplain Alvino Chapel to visit as well. Emotional support provided.   Recommendations/Plan: Partial code status- no CPR due to LVAD Full scope Continued PMT support  Code Status:    Code Status Orders  (From admission, onward)           Start     Ordered   08/29/22 1529  Limited resuscitation (code)  Continuous       Comments: Ventricular Assist Device in place.  Question Answer Comment  In the event of cardiac or respiratory ARREST: Initiate Code Blue, Call Rapid Response Yes   In the event of cardiac or respiratory ARREST: Perform CPR No   In the event of cardiac or respiratory ARREST: Perform Intubation/Mechanical Ventilation Yes   In the event of cardiac or respiratory ARREST: Use NIPPV/BiPAp only if indicated Yes   In the event of cardiac or respiratory ARREST: Administer ACLS medications if indicated Yes   In the event of cardiac or respiratory ARREST: Perform Defibrillation or Cardioversion if indicated Yes      08/29/22 1528            Care plan was discussed with bedside RN  Time spent: 55 minutes  Thank you for allowing the Palliative Medicine Team to assist in the care of this patient.   Sherryll Burger, NP  Please contact Palliative Medicine Team phone at [phone] for questions and concerns.

## 2022-09-03 NOTE — Progress Notes (Signed)
On my shift assessment, patient's heart rate was in the 110s, cardiac index was 2.3. PA pressures were 50s/30s  At around 2300, the patient's heart rate dropped in to the upper 50s. A full re-assessment was done. Blood pressure remained stable but soft in the upper 80s systolic, with MAPs above 70. PA pressures dropped to 30/15. Also of note, the patient had 30 mL of drainage from his pleural chest tubes at the same time his heart rate dropped. The patient is slightly more drowsy but still A&O x4.  No alarms from VAD, patient currently stable and without any complaints. This RN increased rate of epinephrine infusion to meet systolic goal of above 90. Will continue to monitor.

## 2022-09-03 NOTE — Plan of Care (Signed)
  Problem: Coping: Goal: Level of anxiety will decrease Outcome: Progressing   Problem: Safety: Goal: Ability to remain free from injury will improve Outcome: Progressing  Problem: Elimination: Goal: Will not experience complications related to bowel motility Outcome: Completed/Met    Problem: Pain Managment: Goal: General experience of comfort will improve Outcome: Progressing

## 2022-09-03 NOTE — Progress Notes (Signed)
ANTICOAGULATION CONSULT NOTE  Pharmacy Consult for heparin Indication:  LVAD  Allergies  Allergen Reactions   Amiodarone     Severe tremors   Percocet [Oxycodone-Acetaminophen] Itching    Patient Measurements: Height: 5\' 7"  (170.2 cm) Weight: 85.6 kg (188 lb 11.4 oz) IBW/kg (Calculated) : 66.1 Heparin Dosing Weight: 87kg  Vital Signs: Temp: 99 F (37.2 C) (08/18 0700) Temp Source: Core (08/18 0700) BP: 86/58 (08/18 0700) Pulse Rate: 107 (08/18 0700)  Labs: Recent Labs    09/01/22 0500 09/02/22 0527 09/02/22 1831 09/03/22 0333  HGB 9.5* 8.5*  --  9.4*  HCT 30.0* 27.4*  --  30.7*  PLT 174 195  --  260  APTT 34 32  --  34  LABPROT 18.5* 16.8*  --  15.6*  INR 1.5* 1.3*  --  1.2  HEPARINUNFRC  --   --  <0.10* <0.10*  CREATININE 0.74 0.91  --  0.85    Estimated Creatinine Clearance: 97.8 mL/min (by C-G formula based on SCr of 0.85 mg/dL).   Medical History: Past Medical History:  Diagnosis Date   CAD (coronary artery disease)    CHF (congestive heart failure) (HCC)    GERD (gastroesophageal reflux disease)    Hyperlipidemia    Hypertension    Systolic heart failure (HCC) 2021   LVEF 18%, RVEF 38% on cardiac MRI 12/19/2019. possible cardiac sarcoidosis.   Wide-complex tachycardia 2021   wears LifeVest     Assessment: 57yoM on apixaban PTA for hx AF admitted for LVAD workup. Pt s/p HM3 implant on 8/13 c/b acute CVA postop. Head CT 8/17 stable, ok to start low dose heparin per neuro.   Heparin level <0.1 as expected, H/H and pltc stable.  Goal of Therapy:  Heparin level <0.1 units/ml Monitor platelets by anticoagulation protocol: Yes   Plan:  Heparin 500 units/h - no titrations planned for now Daily heparin level and CBC  Fredonia Highland, PharmD, BCPS, [facility] Clinical Pharmacist [phone] Please check AMION for all [facility] numbers 09/03/2022

## 2022-09-03 NOTE — Progress Notes (Signed)
Patient ID: Charles Holmes, male   DOB: 1965-03-14, 57 y.o.   MRN: [id number]     Advanced Heart Failure Rounding Note  PCP-Cardiologist: Charles Herrlich, MD  [facility]: Dr. Gala Holmes   Subjective:    7/25: Admit with cardiogenic shock. Started milrinone and NE. 8/6 S/P 13 teeth extractions  8/9 IABP placed 8/13 S/p HM III LVAD implant + clipping left atrial appendage d/t severe thickening and invagination of mitral valve annulus impeding flows 8/14 Left-sided hemiplegia. CT head with acute R MCA infarct. IR for thrombectomy. CT head with small to moderate area of hemorrhagic conversion 8/15 extubated  8/17 Low dose heparin gtt restarted, CT head with decreased size of hemorrhagic stroke.   Post Op Day #5  Remains on 0.375 milrinone + 3 Epi. iNO off.  Sildenafil 40 tid started.  Lasix 40 mg IV bid yesterday, I/Os net negative 921 with weight down 4 lbs. MAP 80s.   Swan: CVP 14 PA 54/25 CI 2.7 Co-ox 59%  Awake/alert, getting tube feeds.  Now on Dysphagia 1 diet.     On empiric vancomycin/cefepime.   LVAD Interrogation HM III: Speed: 5300 Flow: 4.4 PI: 4.3 Power: 3.8. About 10 PI events overnight, no low flows    Objective:   Weight Range: 85.6 kg Body mass index is 29.56 kg/m.   Vital Signs:   Temp:  [97.9 F (36.6 C)-99.5 F (37.5 C)] 99 F (37.2 C) (08/18 0700) Pulse Rate:  [29-153] 107 (08/18 0700) Resp:  [10-19] 15 (08/18 0700) BP: (77-105)/(58-85) 86/58 (08/18 0700) SpO2:  [90 %-100 %] 93 % (08/18 0700) Weight:  [85.6 kg] 85.6 kg (08/18 0337) Last BM Date : 08/28/22  Weight change: Filed Weights   09/01/22 0600 09/02/22 0600 09/03/22 0337  Weight: 86.6 kg 87.4 kg 85.6 kg    Intake/Output:   Intake/Output Summary (Last 24 hours) at 09/03/2022 0759 Last data filed at 09/03/2022 0700 Gross per 24 hour  Intake 4038.36 ml  Output 4960 ml  Net -921.64 ml   Physical Exam  CVP 14 General: Well appearing this am. NAD.  HEENT: Normal. Neck: Swan right neck, JVP  12 cm.  Cardiac:  Mechanical heart sounds with LVAD hum present.  Lungs:  CTAB, normal effort.  Abdomen:  NT, ND, no HSM. No bruits or masses. +BS  LVAD exit site: Well-healed and incorporated. Dressing dry and intact. No erythema or drainage. Stabilization device present and accurately applied. Driveline dressing changed daily per sterile technique. Extremities:  Warm and dry. No cyanosis, clubbing, rash. 1+ edema to knees.   Neuro:  Alert & oriented x 3. Cranial nerves grossly intact. Moves all 4 extremities w/o difficulty. Affect pleasant     Telemetry     NSR with BiV pacing in 90s (personally reviewed)   Labs    CBC Recent Labs    09/02/22 0527 09/03/22 0333  WBC 12.6* 13.8*  HGB 8.5* 9.4*  HCT 27.4* 30.7*  MCV 99.6 100.7*  PLT 195 260    Basic Metabolic Panel Recent Labs    40/98/11 0527 09/02/22 1633 09/03/22 0333  NA 134*  --  136  K 3.7  --  4.4  CL 105  --  103  CO2 24  --  26  GLUCOSE 152*  --  183*  BUN 22*  --  25*  CREATININE 0.91  --  0.85  CALCIUM 8.2*  --  8.2*  MG 2.1 2.0 1.8  PHOS 2.4* 2.3* 1.7*    BNP: BNP (last  3 results) Recent Labs    07/26/22 1211 08/10/22 1142 08/30/22 0258  BNP 1,723.6* 1,905.0* 922.3*     Imaging    No results found.   Medications:     Scheduled Medications:  sodium chloride   Intravenous Once   acetaminophen  1,000 mg Oral Q6H   Or   acetaminophen (TYLENOL) oral liquid 160 mg/5 mL  1,000 mg Per Tube Q6H   aspirin EC  325 mg Oral Daily   atorvastatin  80 mg Per Tube Daily   bisacodyl  10 mg Rectal Daily   Chlorhexidine Gluconate Cloth  6 each Topical Q0600   dorzolamide-timolol  1 drop Both Eyes BID   feeding supplement (PROSource TF20)  60 mL Per Tube Daily   furosemide  60 mg Intravenous BID   insulin aspart  0-24 Units Subcutaneous Q6H   latanoprost  1 drop Both Eyes QHS   metoCLOPramide (REGLAN) injection  10 mg Intravenous Q6H   mexiletine  250 mg Per Tube BID   mometasone-formoterol  2  puff Inhalation BID   multivitamin with minerals  1 tablet Per Tube Daily   mouth rinse  15 mL Mouth Rinse 4 times per day   pantoprazole (PROTONIX) IV  40 mg Intravenous QHS   polyethylene glycol  17 g Per Tube Daily   sildenafil  40 mg Per Tube TID   sodium chloride flush  3 mL Intravenous Q12H   sodium chloride flush  3 mL Intravenous Q12H   umeclidinium bromide  1 puff Inhalation Daily    Infusions:  sodium chloride Stopped (08/30/22 0516)   sodium chloride Stopped (09/01/22 2027)   sodium chloride     sodium chloride Stopped (09/01/22 2043)   sodium chloride     sodium chloride     ceFEPime (MAXIPIME) IV Stopped (09/03/22 0223)   epinephrine 2 mcg/min (09/03/22 0700)   feeding supplement (VITAL 1.5 CAL) 60 mL/hr at 09/03/22 0700   heparin 500 Units/hr (09/03/22 0700)   lactated ringers     lactated ringers     lactated ringers 20 mL/hr at 09/03/22 0700   magnesium sulfate bolus IVPB     milrinone 0.375 mcg/kg/min (09/03/22 0700)   potassium PHOSPHATE IVPB (in mmol)     vancomycin Stopped (09/02/22 2353)    PRN Medications: sodium chloride, sodium chloride, acetaminophen, albuterol, dextrose, hydrALAZINE, lactated ringers, midazolam, morphine injection, morphine injection, ondansetron (ZOFRAN) IV, mouth rinse, oxyCODONE, sodium chloride flush, sodium chloride flush, traMADol   Patient Profile  [name] is a 57 y.o. 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 Admitted with cardiogenic shock, stabilized and underwent HM3 LVAD. Post implant course c/b acute CVA.   Assessment/Plan   1.  Acute on chronic Systolic HF-->Cardiogenic Shock  - Diagnosed 11/2019. Presented with VT. LHC 70% LAD  - cMRI 12/21 concerning for sarcoid and EF 18%.  - PET 2/22 at [facility] EF 25% + active sarcoid - Echo 08/26/20 EF < 20% severely dilated LV RV mildly decreased.  - [employer] CRT-D upgrade in 06/08/21 - Echo 07/10/22: EF <20%,  RV okay, mod pericardial effusion, mod MR/TR - Admitted 07/25 with cardiogenic shock. - RHC: Nonobstructive CAD, severely elevated filling pressures and low Fick CO/CI (2.7/1.4) - 08/13 HM III LVAD implant + clipping LAA d/t severe thickening and invagination of mitral valve annulus impeding flows.  - Apical core sent to pathology to confirm diagnosis of cardiac sarcoid >> pending -  Speed increased to 5300 on 08/14. On milrinone 0.375 + Epi 3 with CI of 2.7, LVAD flow 4.4.  - Can wean down on epinephrine today as tolerated, leave milrinone at current dosing.  - We had difficulty weaning off iNO => now stopped but PASP in 50s by Swan. PA pressure responds to sildenafil, now at 40 mg tid.  Due for am dose => will give now. - CVP 14, good diuresis yesterday with weight down. Lasix 60 mg IV bid and replace K today.   - CT head yesterday improved hemorrhagic CVA, now back on low dose heparin per neurology.  Have not started back on warfarin yet.  - Continue ASA 325  - Chest tubes out today.   2.  Acute stroke - Hx CVA 06/24 -Admitted 06/24 w/ R MCA stroke. S/p TPA and mechanical clot extraction. No residual deficits. Likely cardioembolic in setting of severe LV dysfunction. - Developed left sided weakness 08/14. CTA with R MCA infarct. Taken to IR for thrombectomy - Repeat CT head with small to moderate size hemorrhagic conversion. CT head this am stable. - D/w neuro, plan repeat head CT on 8/17  - C/w PT/OT. - Has Cor-Trak and still getting tube feeds but started Dysphagia 1 diet yesterday.  - will need CIR once medically stable for d/c (team following)   3. 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 Continue mexiletine  - now s/p ICD.   4. CAD - LHC 12/07/19 70-% LAD, no intervention - LHC 8/24 non obstructive CAD.  - Continue statin. On aspirin for VAD.  5. Cardiac sarcoid - PET 2/22 at [facility] EF 25% + active sarcoid - Has completed prednisone.  - holding  methotrexate w/ recent surgery  - Sent apical core to pathology to confirm diagnosis of cardiac sarcoid  6. Paroxsymal AT/AF - Currently in NSR   7. AKI - suspect cardiorenal, improved w/ inotropic support - Creatinine 0.85 today.  - follow BMP w/ diuresis    8. Iron deficiency anemia/ Post-op anemia - recent T sat 15%, scheduled for OP feraheme. Will complete inpatient  - Transfused 1 u RBCs 8/15 - Hgb 9.4 today.     9. Pulmonary  - PFTs with severe obstructive defect, response to bronchodilator. FEV1 1.04L, FEV1/FVC 48% - extubated 8/15  - Afebrile, on empiric vancomycin/cefepime.  WBCs 13.8 today.  - Cultures NGTD.   10. Elevated LFTs -AST/ALT improved, T bili 4.6>>5.1>>4.2>>2.7  -Follow  CRITICAL CARE Performed by: Marca Ancona  Total critical care time: 45 minutes  Critical care time was exclusive of separately billable procedures and treating other patients.  Critical care was necessary to treat or prevent imminent or life-threatening deterioration.  Critical care was time spent personally by me on the following activities: development of treatment plan with patient and/or surrogate as well as nursing, discussions with consultants, evaluation of patient's response to treatment, examination of patient, obtaining history from patient or surrogate, ordering and performing treatments and interventions, ordering and review of laboratory studies, ordering and review of radiographic studies, pulse oximetry and re-evaluation of patient's condition.   Length of Stay: 24  Marca Ancona, MD  09/03/2022, 7:59 AM  VAD Team Pager [phone] (7am - 7am)   Advanced Heart Failure Team Pager [phone] (M-F; 7a - 5p)  Please contact [facility] for night-coverage after hours (5p -7a ) and weekends on [email or web address]

## 2022-09-03 NOTE — Progress Notes (Addendum)
STROKE TEAM PROGRESS NOTE   BRIEF HPI Charles Holmes is a 57 y.o. 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 He was noted to be moving all extremities and lift hem up of the bed around midnight. As sedation was being weaned in anticipation of eventual extubation in AM, he was noted to not be able to move his LUE and LLE. A code stroke was activated. R MCA M1 occlusion and received manual thrombectomy.    SIGNIFICANT HOSPITAL EVENTS 7/25: ED to Hospital admission 8/13: LVAD placement 8/14: R MCA M1 occlusion and manual thrombectomy 8/15: extubated  INTERIM HISTORY/SUBJECTIVE Pt is in bed without family in the room. He does not report any concerns today. Per nursing, he is still requiring epinephrine and milrinone. Pt encouraged to continuing eating and drinking, so he can be less dependent on NG tube nutrition and have it removed.   OBJECTIVE  CBC    Component Value Date/Time   WBC 13.8 (H) 09/03/2022 0333   RBC 3.05 (L) 09/03/2022 0333   HGB 9.4 (L) 09/03/2022 0333   HGB 14.3 05/31/2021 0906   HCT 30.7 (L) 09/03/2022 0333   HCT 42.6 05/31/2021 0906   PLT 260 09/03/2022 0333   PLT 203 05/31/2021 0906   MCV 100.7 (H) 09/03/2022 0333   MCV 98 (H) 05/31/2021 0906   MCH 30.8 09/03/2022 0333   MCHC 30.6 09/03/2022 0333   RDW 18.1 (H) 09/03/2022 0333   RDW 15.8 (H) 05/31/2021 0906   LYMPHSABS 0.3 (L) 08/31/2022 0310   LYMPHSABS 0.9 05/31/2021 0906   MONOABS 2.2 (H) 08/31/2022 0310   EOSABS 0.0 08/31/2022 0310   EOSABS 0.1 05/31/2021 0906   BASOSABS 0.0 08/31/2022 0310   BASOSABS 0.0 05/31/2021 0906    BMET    Component Value Date/Time   NA 136 09/03/2022 0333   NA 143 05/31/2021 0906   K 4.4 09/03/2022 0333   CL 103 09/03/2022 0333   CO2 26 09/03/2022 0333   GLUCOSE 183 (H) 09/03/2022 0333   BUN 25 (H) 09/03/2022  0333   BUN 12 05/31/2021 0906   CREATININE 0.85 09/03/2022 0333   CALCIUM 8.2 (L) 09/03/2022 0333   EGFR 80 05/31/2021 0906   GFRNONAA >60 09/03/2022 0333    IMAGING past 24 hours DG Chest Port 1 View  Result Date: 09/03/2022 CLINICAL DATA:  LVAD present EXAM: PORTABLE CHEST 1 VIEW COMPARISON:  09/02/2022 FINDINGS: Bilateral chest tubes again noted. Feeding tube coursing below the diaphragm. Swan-Ganz catheter with the tip projecting over the right ventricular outflow tract. Left ventricular assist device again noted in unchanged position. Multi lead cardiac pacemaker. Small bilateral pleural effusions. Bilateral mild interstitial thickening. Right basilar atelectasis. Stable cardiomegaly. Prior median sternotomy. No acute osseous abnormality. IMPRESSION: 1. Cardiomegaly with mild pulmonary vascular congestion. 2. Small bilateral pleural effusions. 3. Support lines and tubing in satisfactory position. Electronically Signed   By: Elige Ko M.D.   On: 09/03/2022 10:04    Vitals:   09/03/22 1200 09/03/22 1300 09/03/22 1400 09/03/22 1500  BP: 100/83 (!) 82/70 (!) 81/65 (!) 84/74  Pulse: 72 (!) 101 73 98  Resp: 17 14 18 19   Temp: 99.3 F (37.4 C) 99.1 F (37.3 C) 99.1 F (37.3 C) 99.1 F (37.3 C)  TempSrc: Core Core  Core  SpO2: 93% 94% 92% 93%  Weight:  Height:         PHYSICAL EXAM General:  awake and cooperative.  CV: Regular rate and rhythm on monitor Respiratory:  normal effort.   NEURO:  Lethargic and mildly sleepy, eyes open on voice, orientated to age, place, time. No aphasia, fluent language, following all simple commands, mild dysarthria. Able to name. No gaze palsy, tracking bilaterally, visual field full, PERRL. Left facial droop. Tongue midline. LUE 3/5 and RUE 4+/5. LLE 2+/5 (cannot lift against gravity) and RLE 3/5. Sensation symmetrical bilaterally, Right FTN intact grossly (left cannot assess), gait not tested.    ASSESSMENT/PLAN  Neurology is signing off.    Stroke - right MCA infarcts with hemorrhagic transformation due to right M1 occlusion s/p IR with TICI3 Etiology:  cardioembolic source (PAF, LVAD, cardiomyopathy with low EF) Code Stroke CT head Acute infarct R temporal cortex and basal ganglia. No acute hemorrhage. ASPECTS 7.    CTA head & neck emergent R M1 occlusion.  CTP 12/102 S/p IR with TICI3 Repeat CT 8/14 Hemorrhagic transformation of evolving infarct in the right caudate nucleus with small volume intraventricular extension of hemorrhage. Repeat CT 8/15 unchanged extent of hemorrhage in right MCA distribution Repeat CT head 8/17 decreased intraparenchymal and intraventricular hemorrhage. No hydrocephalus or midline shift. Echo 8/8 EF 15%, LV severely decreased function, with global hypokinesis LDL 51 HgbA1c 5.5 VTE prophylaxis - SCDs Eliquis (apixaban) daily prior to admission, now on heparin IV Therapy recommendations:  CIR Disposition:  pending  Hx of Stroke/TIA 06/2022: admitted for left sided weakness, CT no acute finding. S/p TPA. CTA head and neck showed right M1 occlusion s/p IR with TICI3. MRI right temporal, insular and BG infarcts. MRA showed right MCA patent. EF < 20%, LDL 59 and A1C 5.1. etiology likely due to cardiomyopathy with low EF, discharged on eliquis and lipitor 80  Atrial fibrillation Home Meds: eliquis 5 mg twice daily, carvedilol 3.125 mg twice daily Continue telemetry monitoring Now on heparin IV  NICM CHF s/p ICD 02/2020 VT with cardiac sarcoidosis  EF 15% with LV severely decreased function, with global hypokinesis 08/29/22 LVAD placed Cardiology on board On milrinone infusion  Now on heparin IV   Hx of hypertension hypotension Home meds:  carvedilol 3.125 mg twice daily, furosemide 40 mg twice daily, spironolactone 12.5 mg once daily Labile and soft BPs in the setting of heart failure Still on epinephrine Blood pressure goal: normotensive  Hyperlipidemia Home meds:  atorvastatin 80 mg  once daily, resumed in hospital LDL 51, goal < 70 Continue statin at discharge  Dysphagia Patient has post-stroke dysphagia, SLP consulted NPO On TF @ 60cc Advance diet as tolerated  Other Stroke Risk Factors ETOH use, advised to drink no more than 2 drink(s) a day  Other Active Problems Acute respiratory insufficiency, extubated Leukocytosis, 13.7-> 19.6->12.6, cultures pending, no fever, on cefepime   Hospital day # 24  Meryl Dare, MD PGY-1 Psychiatry Resident 09/03/2022, 3:22 PM  ATTENDING NOTE: I reviewed above note and agree with the assessment and plan. Pt was seen and examined.   RN at bedside.  Patient lethargic, however open eyes on voice, AOx3, neuro unchanged, still has left-sided weakness.  BP on the low end, still on epinephrine.  On heparin IV, tolerating well.  Further p.o. anticoagulation per cardiology.  Continue statin.  PT and OT recommend CIR.  For detailed assessment and plan, please refer to above/below as I have made changes wherever appropriate.   Neurology will sign off. Please call with questions.  Pt will follow up with stroke clinic Dr. Pearlean Brownie at [facility] in about 4 weeks. Thanks for the consult.   Marvel Plan, MD PhD Stroke Neurology 09/03/2022 7:02 PM    To contact Stroke Continuity provider, please refer to [email or web address]. After hours, contact General Neurology

## 2022-09-04 ENCOUNTER — Inpatient Hospital Stay (HOSPITAL_COMMUNITY): Payer: Medicare Other

## 2022-09-04 ENCOUNTER — Other Ambulatory Visit: Payer: Self-pay

## 2022-09-04 DIAGNOSIS — I5023 Acute on chronic systolic (congestive) heart failure: Secondary | ICD-10-CM | POA: Diagnosis not present

## 2022-09-04 DIAGNOSIS — I509 Heart failure, unspecified: Secondary | ICD-10-CM | POA: Diagnosis not present

## 2022-09-04 DIAGNOSIS — Z515 Encounter for palliative care: Secondary | ICD-10-CM | POA: Diagnosis not present

## 2022-09-04 DIAGNOSIS — I428 Other cardiomyopathies: Secondary | ICD-10-CM | POA: Diagnosis not present

## 2022-09-04 DIAGNOSIS — Z95811 Presence of heart assist device: Secondary | ICD-10-CM | POA: Diagnosis not present

## 2022-09-04 LAB — COMPREHENSIVE METABOLIC PANEL
ALT: 36 U/L (ref 0–44)
AST: 63 U/L — ABNORMAL HIGH (ref 15–41)
Albumin: 2.2 g/dL — ABNORMAL LOW (ref 3.5–5.0)
Alkaline Phosphatase: 156 U/L — ABNORMAL HIGH (ref 38–126)
Anion gap: 10 (ref 5–15)
BUN: 22 mg/dL — ABNORMAL HIGH (ref 6–20)
CO2: 28 mmol/L (ref 22–32)
Calcium: 8.2 mg/dL — ABNORMAL LOW (ref 8.9–10.3)
Chloride: 96 mmol/L — ABNORMAL LOW (ref 98–111)
Creatinine, Ser: 0.68 mg/dL (ref 0.61–1.24)
GFR, Estimated: >60 mL/min (ref >60)
Glucose, Bld: 134 mg/dL — ABNORMAL HIGH (ref 70–99)
Potassium: 4.1 mmol/L (ref 3.5–5.1)
Sodium: 134 mmol/L — ABNORMAL LOW (ref 135–145)
Total Bilirubin: 1.8 mg/dL — ABNORMAL HIGH (ref 0.3–1.2)
Total Protein: 5.3 g/dL — ABNORMAL LOW (ref 6.5–8.1)

## 2022-09-04 LAB — COOXEMETRY PANEL
Carboxyhemoglobin: 1.3 % (ref 0.5–1.5)
Methemoglobin: <0.7 % (ref 0.0–1.5)
O2 Saturation: 53.7 %
Total hemoglobin: 10 g/dL — ABNORMAL LOW (ref 12.0–16.0)

## 2022-09-04 LAB — PHOSPHORUS: Phosphorus: 2.3 mg/dL — ABNORMAL LOW (ref 2.5–4.6)

## 2022-09-04 LAB — ECHOCARDIOGRAM LIMITED
Est EF: <20
Height: 67 in
Weight: 3044.11 oz

## 2022-09-04 LAB — MAGNESIUM: Magnesium: 1.8 mg/dL (ref 1.7–2.4)

## 2022-09-04 LAB — CULTURE, RESPIRATORY W GRAM STAIN: Culture: NO GROWTH

## 2022-09-04 LAB — GLUCOSE, CAPILLARY
Glucose-Capillary: 124 mg/dL — ABNORMAL HIGH (ref 70–99)
Glucose-Capillary: 180 mg/dL — ABNORMAL HIGH (ref 70–99)
Glucose-Capillary: 138 mg/dL — ABNORMAL HIGH (ref 70–99)
Glucose-Capillary: 150 mg/dL — ABNORMAL HIGH (ref 70–99)

## 2022-09-04 LAB — BRAIN NATRIURETIC PEPTIDE: B Natriuretic Peptide: 1050.6 pg/mL — ABNORMAL HIGH (ref 0.0–100.0)

## 2022-09-04 LAB — VANCOMYCIN, PEAK: Vancomycin Pk: 34 ug/mL (ref 30–40)

## 2022-09-04 LAB — CBC
HCT: 31.3 % — ABNORMAL LOW (ref 39.0–52.0)
Hemoglobin: 9.9 g/dL — ABNORMAL LOW (ref 13.0–17.0)
MCH: 30.9 pg (ref 26.0–34.0)
MCHC: 31.6 g/dL (ref 30.0–36.0)
MCV: 97.8 fL (ref 80.0–100.0)
Platelets: 360 10*3/uL (ref 150–400)
RBC: 3.2 MIL/uL — ABNORMAL LOW (ref 4.22–5.81)
RDW: 17.5 % — ABNORMAL HIGH (ref 11.5–15.5)
WBC: 16.1 10*3/uL — ABNORMAL HIGH (ref 4.0–10.5)
nRBC: 1.2 % — ABNORMAL HIGH (ref 0.0–0.2)

## 2022-09-04 LAB — PROTIME-INR
INR: 1.1 (ref 0.8–1.2)
Prothrombin Time: 14.1 s (ref 11.4–15.2)

## 2022-09-04 LAB — APTT: aPTT: 30 s (ref 24–36)

## 2022-09-04 LAB — HEPARIN LEVEL (UNFRACTIONATED): Heparin Unfractionated: <0.1 [IU]/mL — ABNORMAL LOW (ref 0.30–0.70)

## 2022-09-04 LAB — LACTATE DEHYDROGENASE: LDH: 384 U/L — ABNORMAL HIGH (ref 98–192)

## 2022-09-04 LAB — VANCOMYCIN, TROUGH: Vancomycin Tr: 15 ug/mL (ref 15–20)

## 2022-09-04 MED ORDER — VANCOMYCIN HCL 750 MG/150ML IV SOLN
750.0000 mg | Freq: Two times a day (BID) | INTRAVENOUS | Status: AC
  Administered 2022-09-04 – 2022-09-11 (×15): 750 mg via INTRAVENOUS
  Filled 2022-09-04 (×15): qty 150

## 2022-09-04 MED ORDER — WARFARIN SODIUM 2.5 MG PO TABS: 2.5000 mg | ORAL_TABLET | Freq: Once | ORAL | Status: DC

## 2022-09-04 MED ORDER — FUROSEMIDE 10 MG/ML IJ SOLN
80.0000 mg | Freq: Two times a day (BID) | INTRAMUSCULAR | Status: DC
  Administered 2022-09-04 – 2022-09-06 (×5): 80 mg via INTRAVENOUS
  Administered 2022-09-06: 40 mg via INTRAVENOUS
  Filled 2022-09-04 (×7): qty 8

## 2022-09-04 MED ORDER — SODIUM CHLORIDE 0.9% FLUSH: 10.0000 mL | INTRAVENOUS | Status: DC | PRN

## 2022-09-04 MED ORDER — MAGNESIUM SULFATE 2 GM/50ML IV SOLN
2.0000 g | Freq: Once | INTRAVENOUS | Status: AC
  Administered 2022-09-04: 2 g via INTRAVENOUS
  Filled 2022-09-04: qty 50

## 2022-09-04 MED ORDER — SODIUM CHLORIDE 0.9% FLUSH
10.0000 mL | Freq: Two times a day (BID) | INTRAVENOUS | Status: DC
  Administered 2022-09-04 – 2022-09-05 (×2): 10 mL

## 2022-09-04 MED ORDER — WARFARIN SODIUM 1 MG PO TABS
1.0000 mg | ORAL_TABLET | Freq: Once | ORAL | Status: AC
  Administered 2022-09-04: 1 mg via ORAL
  Filled 2022-09-04: qty 1

## 2022-09-04 MED ORDER — WARFARIN - PHYSICIAN DOSING INPATIENT: Freq: Every day | Status: DC

## 2022-09-04 NOTE — Progress Notes (Signed)
Daily Progress Note   Patient Name: Charles Holmes       Date: 09/04/2022 DOB: 06-27-1965  Age: 57 y.o. MRN#: [id number] Attending Physician: Dolores Patty, MD Primary Care Physician: [name], PA-C Admit Date: 08/10/2022  Reason for Consultation/Follow-up: Establishing goals of care  Length of Stay: 25   Physical Exam Vitals and nursing note reviewed.  Constitutional:      General: He is awake.     Appearance: He is ill-appearing.  Pulmonary:     Effort: Pulmonary effort is normal.  Skin:    General: Skin is warm and dry.  Neurological:     Mental Status: He is alert and oriented to person, place, and time.     Cranial Nerves: Facial asymmetry present.     Comments: Left facial droop             Vital Signs: BP (!) 82/55   Pulse (!) 113   Temp 99.7 F (37.6 C)   Resp (!) 23   Ht 5\' 7"  (1.702 m)   Wt 86.3 kg   SpO2 95%   BMI 29.80 kg/m  SpO2: SpO2: 95 % O2 Device: O2 Device: Room Air O2 Flow Rate: O2 Flow Rate (L/min): 3 L/min  Intake/output summary:  Intake/Output Summary (Last 24 hours) at 09/04/2022 1252 Last data filed at 09/04/2022 1200 Gross per 24 hour  Intake 3602.65 ml  Output 5291 ml  Net -1688.35 ml   LBM: Last BM Date : 09/04/22 Baseline Weight: Weight: 77.1 kg Most recent weight: Weight: 86.3 kg    Patient Active Problem List   Diagnosis Date Noted   Middle cerebral artery embolism, 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   Chronic obstructive pulmonary disease (HCC) 08/23/2022   History of CVA (cerebrovascular accident) 08/11/2022   Paroxysmal atrial fibrillation (HCC) 08/11/2022   Iron deficiency anemia 08/11/2022   CHF (congestive heart failure) (HCC) 08/10/2022   Stroke (cerebrum) (HCC) 07/11/2022   Status post surgery 07/10/2022   Cardiac  sarcoidosis 03/02/2020   Coronary artery disease involving native coronary artery of native heart without angina pectoris 03/02/2020   Chronic systolic heart failure (HCC) 02/21/2020   Ventricular tachycardia (HCC) 02/19/2020   Ventricular tachycardia, sustained (HCC) 02/19/2020   AKI (acute kidney injury) (HCC) 12/19/2019   CAD (coronary artery disease) 12/19/2019   Hyperlipidemia with target  LDL less than 70 12/19/2019   Wide-complex tachycardia 12/16/2019   Acute on chronic systolic CHF (congestive heart failure) (HCC) 12/16/2019   HTN (hypertension) 12/16/2019    Palliative Care Assessment & Plan   Patient Profile: 57 y.o. 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   Full assessment and workup; status post LVAD implant on [phone]. Postop day 1 code stroke called, CT significant for acute right MCA infarct, status post thrombectomy.   Today's Discussion: Extensive chart review has been completed prior to meeting with patient including labs, vital signs, imaging, progress/consult notes, orders, medications and available advance directive documents.   Patient is resting in bed. He mood seems improved from yesterday and he is joking with staff. He is tired from working with PT and OT this morning. He states he was able to get to the side of bed and stand twice. He is hopeful that he can get the cortrak removed soon. I encouraged him to try to increase his oral intake and calories. I let him know that chaplain Alvino Chapel may visit him this week for additional support.  9:45 update: Spoke with patient's daughter Dorathy Daft. Gave update from visit with patient today. Shared that I thought the patients mood was improved from  yesterday. She is happy to hear he was more himself today. Emotional support provided.   Recommendations/Plan: Partial code status- no CPR due to LVAD Full scope Continued PMT support  Code Status:    Code Status Orders  (From admission, onward)           Start     Ordered   08/29/22 1529  Limited resuscitation (code)  Continuous       Comments: Ventricular Assist Device in place.  Question Answer Comment  In the event of cardiac or respiratory ARREST: Initiate Code Blue, Call Rapid Response Yes   In the event of cardiac or respiratory ARREST: Perform CPR No   In the event of cardiac or respiratory ARREST: Perform Intubation/Mechanical Ventilation Yes   In the event of cardiac or respiratory ARREST: Use NIPPV/BiPAp only if indicated Yes   In the event of cardiac or respiratory ARREST: Administer ACLS medications if indicated Yes   In the event of cardiac or respiratory ARREST: Perform Defibrillation or Cardioversion if indicated Yes      08/29/22 1528            Care plan was discussed with bedside RN  Time spent: 35 minutes  Thank you for allowing the Palliative Medicine Team to assist in the care of this patient.   Sherryll Burger, NP  Please contact Palliative Medicine Team phone at [phone] for questions and concerns.

## 2022-09-04 NOTE — Progress Notes (Signed)
Inpatient Rehab Admissions Coordinator:   Per therapy recommendations,  patient was screened for CIR candidacy by Laura Staley, MS, CCC-SLP . At this time, Pt. Appears to be a a potential candidate for CIR. I will request   order for rehab consult per protocol for full assessment. Please contact me any with questions.  Laura Staley, MS, CCC-SLP Rehab Admissions Coordinator  [phone] (celll) [phone] (office)  

## 2022-09-04 NOTE — Progress Notes (Signed)
Echocardiogram 2D Echocardiogram has been performed.  Lucendia Herrlich 09/04/2022, 11:24 AM

## 2022-09-04 NOTE — Progress Notes (Signed)
Peripherally Inserted Central Catheter Placement  The IV Nurse has discussed with the patient and/or persons authorized to consent for the patient, the purpose of this procedure and the potential benefits and risks involved with this procedure.  The benefits include less needle sticks, lab draws from the catheter, and the patient may be discharged home with the catheter. Risks include, but not limited to, infection, bleeding, blood clot (thrombus formation), and puncture of an artery; nerve damage and irregular heartbeat and possibility to perform a PICC exchange if needed/ordered by physician.  Alternatives to this procedure were also discussed.  Bard Power PICC patient education guide, fact sheet on infection prevention and patient information card has been provided to patient /or left at bedside.    PICC Placement Documentation  PICC Double Lumen 09/04/22 Right Brachial 40 cm 0 cm (Active)  Indication for Insertion or Continuance of Line Vasoactive infusions 09/04/22 1536  Exposed Catheter (cm) 0 cm 09/04/22 1536  Site Assessment Clean, Dry, Intact 09/04/22 1536  Lumen #1 Status Flushed;Saline locked;Blood return noted 09/04/22 1536  Lumen #2 Status Flushed;Saline locked;Blood return noted 09/04/22 1536  Dressing Type Transparent;Securing device 09/04/22 1536  Dressing Status Antimicrobial disc in place;Clean, Dry, Intact 09/04/22 1536  Line Adjustment (NICU/IV Team Only) No 09/04/22 1536  Dressing Intervention New dressing;Other (Comment) 09/04/22 1536  Dressing Change Due 09/11/22 09/04/22 1536       Reginia Forts Albarece 09/04/2022, 3:37 PM

## 2022-09-04 NOTE — Plan of Care (Signed)
  Problem: Education: Goal: Understanding of CV disease, CV risk reduction, and recovery process will improve Outcome: Progressing Goal: Individualized Educational Video(s) Outcome: Progressing   Problem: Cardiovascular: Goal: Ability to achieve and maintain adequate cardiovascular perfusion will improve Outcome: Progressing Goal: Vascular access site(s) Level 0-1 will be maintained Outcome: Progressing   Problem: Health Behavior/Discharge Planning: Goal: Ability to safely manage health-related needs after discharge will improve Outcome: Progressing   Problem: Education: Goal: Knowledge of General Education information will improve Description: Including pain rating scale, medication(s)/side effects and non-pharmacologic comfort measures Outcome: Progressing

## 2022-09-04 NOTE — Progress Notes (Signed)
Patient ID: Charles Holmes, male   DOB: 06-27-1965, 57 y.o.   MRN: [id number]     Advanced Heart Failure Rounding Note  PCP-Cardiologist: Norman Herrlich, MD  [facility]: Dr. Gala Romney   Subjective:    7/25: Admit with cardiogenic shock. Started milrinone and NE. 8/6 S/P 13 teeth extractions  8/9 IABP placed 8/13 S/p HM III LVAD implant + clipping left atrial appendage d/t severe thickening and invagination of mitral valve annulus impeding flows 8/14 Left-sided hemiplegia. CT head with acute R MCA infarct. IR for thrombectomy. CT head with small to moderate area of hemorrhagic conversion 8/15 extubated  8/17 Low dose heparin gtt restarted, CT head with decreased size of hemorrhagic stroke.  8/18 Neurology signed off  Post Op Day #6  Remains on 0.375 milrinone + 4 Epi. iNO off.  On Sildenafil 40 tid.  Lasix 60 mg IV bid yesterday, I/Os net negative 1.3L. Weight up 2lbs. MAP 70s-90s.   Swan: CVP 15 PA 30/20 (25) CI 2.1 Co-ox 54%  A&O x3 moving all extremities. Wanting to sit up to eat breakfast. Still getting continuous tube feeds.   On empiric vancomycin/cefepime. tMax 100.8 last night. WBC 13.8>16.1  LVAD Interrogation HM III: Speed: 5350 Flow: 4.4 PI: 3.4 Power: 3.9. x19 PI events overnight, no low flows    Objective:   Weight Range: 86.3 kg Body mass index is 29.8 kg/m.   Vital Signs:   Temp:  [98.8 F (37.1 C)-100.8 F (38.2 C)] 100 F (37.8 C) (08/19 0700) Pulse Rate:  [66-221] 207 (08/19 0630) Resp:  [14-27] 21 (08/19 0700) BP: (72-107)/(53-91) 97/80 (08/19 0430) SpO2:  [82 %-95 %] 95 % (08/19 0630) Weight:  [86.3 kg] 86.3 kg (08/19 0500) Last BM Date : 09/04/22  Weight change: Filed Weights   09/02/22 0600 09/03/22 0337 09/04/22 0500  Weight: 87.4 kg 85.6 kg 86.3 kg    Intake/Output:   Intake/Output Summary (Last 24 hours) at 09/04/2022 0711 Last data filed at 09/04/2022 0700 Gross per 24 hour  Intake 4077.44 ml  Output 5390 ml  Net -1312.56 ml    Physical Exam  CVP 15/16 General:  Well appearing. No resp difficulty HEENT: Normal. +cortrak Neck: supple. JVP elevated. Carotids 2+ bilat; no bruits. No lymphadenopathy or thyromegaly appreciated. Teddy Spike.  Cor: Mechanical heart sounds with LVAD hum present. Chest tube in place. Healing midsternal incision.  Lungs: Clear Abdomen: soft, nontender, nondistended. No hepatosplenomegaly. No bruits or masses. Good bowel sounds. Driveline: C/D/I; securement device intact and driveline incorporated Extremities: no cyanosis, clubbing, rash, +2 BLE edema GU: +foley Neuro: alert & oriented x3, cranial nerves grossly intact. moves all 4 extremities. Affect pleasant   Telemetry     ST with BiV pacing 110s (personally reviewed)    Labs    CBC Recent Labs    09/03/22 0333 09/04/22 0447  WBC 13.8* 16.1*  HGB 9.4* 9.9*  HCT 30.7* 31.3*  MCV 100.7* 97.8  PLT 260 360    Basic Metabolic Panel Recent Labs    78/46/96 0333 09/04/22 0447  NA 136 134*  K 4.4 4.1  CL 103 96*  CO2 26 28  GLUCOSE 183* 134*  BUN 25* 22*  CREATININE 0.85 0.68  CALCIUM 8.2* 8.2*  MG 1.8 1.8  PHOS 1.7* 2.3*    BNP: BNP (last 3 results) Recent Labs    07/26/22 1211 08/10/22 1142 08/30/22 0258  BNP 1,723.6* 1,905.0* 922.3*     Imaging    No results found.   Medications:  Scheduled Medications:  sodium chloride   Intravenous Once   aspirin EC  325 mg Oral Daily   atorvastatin  80 mg Per Tube Daily   bisacodyl  10 mg Rectal Daily   Chlorhexidine Gluconate Cloth  6 each Topical Q0600   dorzolamide-timolol  1 drop Both Eyes BID   feeding supplement (PROSource TF20)  60 mL Per Tube Daily   furosemide  60 mg Intravenous BID   insulin aspart  0-24 Units Subcutaneous Q6H   latanoprost  1 drop Both Eyes QHS   mexiletine  250 mg Per Tube BID   mometasone-formoterol  2 puff Inhalation BID   multivitamin with minerals  1 tablet Per Tube Daily   mouth rinse  15 mL Mouth Rinse 4 times  per day   pantoprazole (PROTONIX) IV  40 mg Intravenous QHS   polyethylene glycol  17 g Per Tube Daily   sildenafil  40 mg Per Tube TID   sodium chloride flush  3 mL Intravenous Q12H   sodium chloride flush  3 mL Intravenous Q12H   umeclidinium bromide  1 puff Inhalation Daily    Infusions:  sodium chloride Stopped (08/30/22 0516)   sodium chloride Stopped (09/01/22 2027)   sodium chloride     sodium chloride Stopped (09/01/22 2043)   sodium chloride     sodium chloride     ceFEPime (MAXIPIME) IV Stopped (09/04/22 0253)   epinephrine 4 mcg/min (09/04/22 0700)   feeding supplement (VITAL 1.5 CAL) 60 mL/hr at 09/04/22 0700   heparin 500 Units/hr (09/04/22 0700)   lactated ringers     lactated ringers     lactated ringers 10 mL/hr at 09/04/22 0700   milrinone 0.375 mcg/kg/min (09/04/22 0700)   vancomycin Stopped (09/03/22 2255)    PRN Medications: sodium chloride, sodium chloride, acetaminophen, albuterol, dextrose, hydrALAZINE, lactated ringers, midazolam, morphine injection, morphine injection, ondansetron (ZOFRAN) IV, mouth rinse, oxyCODONE, sodium chloride flush, sodium chloride flush, traMADol   Patient Profile  [name] is a 57 y.o. 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 Admitted with cardiogenic shock, stabilized and underwent HM3 LVAD. Post implant course c/b acute CVA.   Assessment/Plan  1.  Acute on chronic Systolic HF-->Cardiogenic Shock  - Diagnosed 11/2019. Presented with VT. LHC 70% LAD  - cMRI 12/21 concerning for sarcoid and EF 18%.  - PET 2/22 at [facility] EF 25% + active sarcoid - Echo 08/26/20 EF < 20% severely dilated LV RV mildly decreased.  - [employer] CRT-D upgrade in 06/08/21 - Echo 07/10/22: EF <20%, RV okay, mod pericardial effusion, mod MR/TR - Admitted 07/25 with cardiogenic shock. - RHC: Nonobstructive CAD, severely elevated filling pressures and low Fick CO/CI (2.7/1.4) - 08/13 HM  III LVAD implant + clipping LAA d/t severe thickening and invagination of mitral valve annulus impeding flows.  - Apical core sent to pathology to confirm diagnosis of cardiac sarcoid >> pending - Speed increased to 5300 on 08/14. On milrinone 0.375 + Epi 3 with CI of 2.7, LVAD flow 4.4.  - Continue current doses of epi and milrinone as he continues to diurese.  - We had difficulty weaning off iNO => now stopped but PASP in 50s by Swan. PA pressure responds to sildenafil, now at 40 mg tid.  - CVP 15/16, good UOP yesterday but only net - 1.3L. Weight up 2lbs. Increase lasix 60>80 mg IV bid and replace K today.   - CT head  09/02/22 w/ improved hemorrhagic CVA, now back on low dose heparin per neurology.  Have not started back on warfarin yet. Discussed with TCTS, plan to start warfarin today.  - Continue ASA 325   2.  Acute stroke - Hx CVA 06/24 -Admitted 06/24 w/ R MCA stroke. S/p TPA and mechanical clot extraction. No residual deficits. Likely cardioembolic in setting of severe LV dysfunction. - Developed left sided weakness 08/14. CTA with R MCA infarct. Taken to IR for thrombectomy - Repeat CT head with small to moderate size hemorrhagic conversion. CT head this am stable. - D/w neuro, repeat head CT on 8/17 w/ improved hemorrhagic CVA, now back on low dose heparin per neurology.  Have not started back on warfarin yet. Further p.o. anticoagulation per cardiology per Dr. Roda Shutters. Discussed with TCTS, plan to start warfarin today.  - neurology signed off 09/03/22.  - C/w PT/OT. - Has Cor-Trak and still getting tube feeds but started Dysphagia 1 diet yesterday.  - will need CIR once medically stable for d/c (team following)   3. 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 Continue mexiletine  - now s/p ICD.   4. CAD - LHC 12/07/19 70-% LAD, no intervention - LHC 8/24 non obstructive CAD.  - Continue statin. On aspirin for VAD.  5. Cardiac sarcoid - PET 2/22 at [facility]  EF 25% + active sarcoid - Has completed prednisone.  - holding methotrexate w/ recent surgery  - Sent apical core to pathology to confirm diagnosis of cardiac sarcoid  6. Paroxsymal AT/AF - Currently in NSR   7. AKI - suspect cardiorenal, improved w/ inotropic support - Creatinine 0.68 today.  - follow BMP w/ diuresis    8. Iron deficiency anemia/ Post-op anemia - recent T sat 15%, scheduled for OP feraheme. Will complete inpatient  - Transfused 1 u RBCs 8/15 - Hgb 9.9 today.     9. Pulmonary  - PFTs with severe obstructive defect, response to bronchodilator. FEV1 1.04L, FEV1/FVC 48% - extubated 8/15  - Afebrile, on empiric vancomycin/cefepime.  WBCs 16.1 today.  - Cultures NGTD.   10. Elevated LFTs -AST/ALT improved, T bili 4.6>>5.1>>4.2>>2.7>>1.8 -Follow   Length of Stay: 25  Alen Bleacher, NP  09/04/2022, 7:11 AM  VAD Team Pager [phone] (7am - 7am)   Advanced Heart Failure Team Pager [phone] (M-F; 7a - 5p)  Please contact [facility] for night-coverage after hours (5p -7a ) and weekends on [email or web address]

## 2022-09-04 NOTE — Progress Notes (Addendum)
Pharmacy Antibiotic Note  Charles Holmes is a 57 y.o. male admitted on 08/10/2022 now s/p LVAD placement 8/13 with fever and leukocytosis. Pharmacy has been consulted for vancomycin and cefepime dosing.   Renal function stable and vancomycin AUC is slightly high at 568 (goal 400-550).  Vancomycin might still accumulate.    Plan: Reduce vanc to 750mg  IV Q12H Continue cefepime 2gm IV Q8H Monitor renal fxn, clinical progress, weekly vanc levels  Height: 5\' 7"  (170.2 cm) Weight: 86.3 kg (190 lb 4.1 oz) IBW/kg (Calculated) : 66.1  Temp (24hrs), Avg:99.8 F (37.7 C), Min:98.6 F (37 C), Max:100.8 F (38.2 C)  Recent Labs  Lab 08/31/22 0310 09/01/22 0500 09/02/22 0527 09/03/22 0333 09/04/22 0447 09/04/22 1233  WBC 13.7* 19.6* 12.6* 13.8* 16.1*  --   CREATININE 0.88 0.74 0.91 0.85 0.68  --   VANCOPEAK  --   --   --   --   --  34    Estimated Creatinine Clearance: 104.3 mL/min (by C-G formula based on SCr of 0.68 mg/dL).    Allergies  Allergen Reactions   Amiodarone     Severe tremors   Percocet [Oxycodone-Acetaminophen] Itching   Restart vanc 8/16> Cefepime 8/16>  8/16 Bcx > NGTD 8/16 sputum > NGTD  Charles Holmes, PharmD, BCPS, BCCCP 09/04/2022, 10:22 PM

## 2022-09-04 NOTE — Progress Notes (Signed)
CSW visited bedside- patient getting dressing change with LVAD coordinator. No family at bedside at the time of visit- no reported needs by patient. CSW continues to follow for supportive needs throughout implant hospitalization.    Burna Sis, LCSW Clinical Social Worker Advanced [facility] Failure [facility] Desk#: [phone] Cell#: [phone]

## 2022-09-04 NOTE — Progress Notes (Signed)
Speech Language Pathology Treatment: Dysphagia  Patient Details Name: Charles Holmes MRN: [id number] DOB: 10-Oct-1965 Today's Date: 09/04/2022 Time: 8657-8469 SLP Time Calculation (min) (ACUTE ONLY): 14 min  Assessment / Plan / Recommendation Clinical Impression  Pt is tolerating meals, but assist with meal is laborious for RN; pt is impulsive and has needed CLPSE supervision with thin liquids to avoid rapid intake and coughing. On FEES SLP observed deep penetration or sensed aspiration with large sips. Today SLP trialed sips of nectar, which pt tolerated in consecutive sips without coughing, though thin liquids continue to elicit cough. Will change order to nectar thick liquids to arrive with meals, though if pt wants sips of thin liquids with supervision from RN that is still ok. Would like meals to be less effortful so pt could progress to d/c NG tube. He complains that NG is limiting his intake due to discomfort. If pt is eating enough SLP is ok with NG tube removal when MD and RD are as well.   HPI HPI: Charles Holmes is 57 year old presented to [facility] on 08/10/22 for acute on chronic systolic heart failure. Multiple dental extractions 8/6. Pt had IABP placed on 08/25/22 for ongoing cardiogenic shock. Underwent placement of LVAD on 8/13. Developed lt sided weakness on 8/14. Rt MCA CVA. Underwent mechanical thrombectomy by IR. ETT 8/13-15.  PMH - CAD, CHF, HLD, HTN, systolic HF, ICD implant, rt CVA.  LVAD placement and new stroke R MCA M1.      SLP Plan  Continue with current plan of care      Recommendations for follow up therapy are one component of a multi-disciplinary discharge planning process, led by the attending physician.  Recommendations may be updated based on patient status, additional functional criteria and insurance authorization.    Recommendations  Diet recommendations: Nectar-thick liquid;Thin liquid;Dysphagia 1 (puree) Liquids provided via: Straw;Cup Supervision: Full  supervision/cueing for compensatory strategies Compensations: Slow rate;Small sips/bites;Clear throat intermittently Postural Changes and/or Swallow Maneuvers: Seated upright 90 degrees                        Dysphagia, unspecified (R13.10)     Continue with current plan of care     Keaden Gunnoe, Riley Nearing  09/04/2022, 9:48 AM

## 2022-09-04 NOTE — Progress Notes (Signed)
HeartMate 3 Rounding Note  POD #6 Implant HM-3  Subjective:   57 year old male with cardiac sarcoidosis, nonischemic cardiomyopathy, and advanced heart failure refractory to optimal medical therapy underwent HeartMate 3 implantation on August 13 and required preoperative support with intra-aortic balloon pump and inotropic therapy.  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  Intraoperatively the patient had moderate RV dysfunction and following VAD implantation there was some obstruction to the inflow through the mitral valve from posterior annular thickening/ hematoma which was not clearly defined as the inflow cannula was in good position and far removed from the mitral valve.  After application of a left atrial clip the inflow appeared to be improved.  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 Off anticoagulation now  due to mild hemorrhagic extension by f/u CT.  Patient had a stable day today.  The remaining chest tubes have been removed.  PA catheter was removed after a PICC line placed.  Patient worked with PT today and stood by side of bed.  His swallowing and speech function are progressing. White count remains elevated 16,000 with low-grade fever.  He is on broad-spectrum antibiotics vancomycin and Maxipime.  Blood and urine cultures are negative.  Tracheal aspirate shows heavy WBCs but no bacteria.  Limited echocardiogram today shows moderate RV dysfunction, better mitral inflow into the LV and VAD position satisfactory.  No significant pericardial effusion.  LVAD INTERROGATION:  HeartMate II LVAD:  Flow 4.4  liters/min, speed 5300 rpm, power 3.6, PI 4.1.  Controller intact.    Objective:    Vital Signs:   Temp:  [98.8 F (37.1 C)-100.8 F (38.2 C)] 99.9 F (37.7 C) (08/19 1500) Pulse Rate:  [97-221] 112 (08/19 1500) Resp:  [14-27] 22 (08/19 1500) BP: (72-146)/(53-110) 86/65 (08/19 1345) SpO2:  [81 %-95 %] 95 % (08/19 1500) Weight:  [86.3 kg] 86.3 kg (08/19 0500) Last BM Date : 09/04/22 Mean arterial Pressure 88-92 mm Hg  Intake/Output:  Exam  General -alert moving L side better, speech better HEENT:-No bleeding from mouth at extraction sits Neck: supple. JVP . Carotids without bruits. No lymphadenopathy or thryomegaly appreciated. Cor: Mechanical heart sounds with LVAD hum present. Lungs: clear Abdomen: soft, nontender, nondistended. No hepatosplenomegaly. No bruits or masses. Good bowel sounds. Extremities: no cyanosis, clubbing, rash, edema Neuro: alert & orientedx3, cranial nerves grossly intact. moves all 4 extremities . Affect pleasant  Telemetry: paced with PPM 90/min  Labs: Basic Metabolic Panel: Recent Labs  Lab 08/31/22 0310 08/31/22 0329 08/31/22 1504 09/01/22 0500 09/01/22 1612 09/02/22 0527 09/02/22 1633 09/03/22 0333 09/04/22 0447  NA 132*   < > 134* 134*  --  134*  --  136 134*  K 4.0   < > 4.2 4.4  --  3.7  --  4.4 4.1  CL 103  --   --  104  --  105  --  103 96*  CO2 23  --   --  22  --  24  --  26 28  GLUCOSE 119*  --   --  112*  --  152*  --  183* 134*  BUN 9  --   --  17  --  22*  --  25* 22*  CREATININE 0.88  --   --  0.74  --  0.91  --  0.85 0.68  CALCIUM 8.0*  --   --  8.4*  --  8.2*  --  8.2* 8.2*  MG 2.2  --   --  2.3 2.2 2.1 2.0 1.8 1.8  PHOS 2.8  --   --  3.1 2.7 2.4* 2.3* 1.7* 2.3*   < > = values in this interval not displayed.    Liver Function Tests: Recent Labs  Lab 08/31/22 0310 09/01/22 0500 09/02/22 0527 09/03/22 0333 09/04/22 0447  AST 96* 73* 65* 68* 63*  ALT 30 23 27  34 36  ALKPHOS 41 58 97 147* 156*  BILITOT 4.6* 5.1* 4.2* 2.7* 1.8*  PROT 4.9* 5.3* 5.0* 5.5* 5.3*  ALBUMIN 2.7* 2.7*  2.4* 2.3* 2.2*   No results for input(s): "LIPASE", "AMYLASE" in the last 168 hours. No results for input(s): "AMMONIA" in the last 168 hours.  CBC: Recent Labs  Lab 08/30/22 0258 08/30/22 0304 08/31/22 0310 08/31/22 0329 08/31/22 1504 09/01/22 0500 09/02/22 0527 09/03/22 0333 09/04/22 0447  WBC 11.6*   < > 13.7*  --   --  19.6* 12.6* 13.8* 16.1*  NEUTROABS 9.9*  --  11.0*  --   --   --   --   --   --   HGB 9.0*   < > 7.9*   < > 9.5* 9.5* 8.5* 9.4* 9.9*  HCT 27.3*   < > 24.8*   < > 28.0* 30.0* 27.4* 30.7* 31.3*  MCV 96.8   < > 100.0  --   --  99.3 99.6 100.7* 97.8  PLT 167   < > 139*  --   --  174 195 260 360   < > = values in this interval not displayed.    INR: Recent Labs  Lab 08/31/22 0310 09/01/22 0500 09/02/22 0527 09/03/22 0333 09/04/22 0447  INR 1.6* 1.5* 1.3* 1.2 1.1    Other results: EKG:   Imaging: ECHOCARDIOGRAM LIMITED  Result Date: 09/04/2022    ECHOCARDIOGRAM LIMITED REPORT   Patient Name:   Charles Holmes Date of Exam: 09/04/2022 Medical Rec #:  [id number]       Height:       67.0 in Accession #:    [id number]      Weight:       190.3 lb Date of Birth:  1965/12/04       BSA:          1.980 m Patient Age:    57 years        BP:           97/80 mmHg Patient Gender: M               HR:           104 bpm. Exam Location:  Inpatient Procedure: Limited Echo and Limited Color Doppler Indications:    CHF I50.9  History:        Patient has prior history of Echocardiogram examinations, most                 recent 08/24/2022. CHF, CAD, Stroke and COPD, Arrythmias:Atrial                 Fibrillation and Tachycardia; Risk Factors:Hypertension and                 Dyslipidemia.  Sonographer:    Lucendia Herrlich Referring Phys: [phone] Avelina Mcclurkin  IMPRESSIONS  1. LVAD at LV apex. No subcostal images.  2. Left ventricular ejection fraction, by estimation, is <20%. The left ventricle has severely decreased function. The left ventricle demonstrates global hypokinesis. The left  ventricular internal cavity size was severely dilated.  3. Right ventricular systolic function is severely reduced. The right ventricular size is normal.  4. Left atrial size was mildly dilated.  5. Right atrial size was mildly dilated.  6. The mitral valve is normal in structure. No evidence of mitral valve regurgitation. No evidence of mitral stenosis.  7. The aortic valve has an indeterminant number of cusps. Aortic valve regurgitation is not visualized. FINDINGS  Left Ventricle: Left ventricular ejection fraction, by estimation, is <20%. The left ventricle has severely decreased function. The left ventricle demonstrates global hypokinesis. The left ventricular internal cavity size was severely dilated. There is no left ventricular hypertrophy. Right Ventricle: The right ventricular size is normal. Right ventricular systolic function is severely reduced. Left Atrium: Left atrial size was mildly dilated. Right Atrium: Right atrial size was mildly dilated. Pericardium: There is no evidence of pericardial effusion. Mitral Valve: The mitral valve is normal in structure. No evidence of mitral valve stenosis. Tricuspid Valve: The tricuspid valve is normal in structure. Tricuspid valve regurgitation is mild . No evidence of tricuspid stenosis. Aortic Valve: The aortic valve has an indeterminant number of cusps. Aortic valve regurgitation is not visualized. Pulmonic Valve: The pulmonic valve was not well visualized. Aorta: The aortic root is normal in size and structure. Additional Comments: LVAD at LV apex. No subcostal images. A venous catheter is visualized.  RIGHT VENTRICLE RV S prime:     7.77 cm/s TRICUSPID VALVE TR Peak grad:   40.7 mmHg TR Vmax:        319.00 cm/s Olga Millers MD Electronically signed by Olga Millers MD Signature Date/Time: 09/04/2022/12:45:59 PM    Final    DG Chest Port 1 View  Result Date: 09/04/2022 CLINICAL DATA:  [id number] LVAD (left ventricular assist device) present [facility]) [id number] EXAM:  PORTABLE CHEST 1 VIEW COMPARISON:  09/03/2022 FINDINGS: Right IJ approach pulmonary arterial catheter terminates within the central pulmonary outflow. Feeding tube courses below the diaphragm. Unchanged positioning of left ventricular assist device. Pacemaker in place. Stable cardiomegaly status post sternotomy. Bilateral chest tubes have been removed. Mild interstitial prominence bilaterally with probable trace bilateral pleural effusions. Bibasilar atelectasis. No pneumothorax. IMPRESSION: 1. Interval removal of bilateral chest tubes. No pneumothorax. 2. Mild interstitial prominence bilaterally with probable trace bilateral pleural effusions. Electronically Signed   By: Duanne Guess D.O.   On: 09/04/2022 10:22   [place] EKG SITE RITE  Result Date: 09/04/2022 If Site Rite image not attached, placement could not be confirmed due to current cardiac rhythm.  DG Chest Port 1 View  Result Date: 09/03/2022 CLINICAL DATA:  LVAD present EXAM: PORTABLE CHEST 1 VIEW COMPARISON:  09/02/2022 FINDINGS: Bilateral chest tubes again noted. Feeding tube coursing below the diaphragm. Swan-Ganz catheter with the tip projecting over the right ventricular outflow tract. Left ventricular assist device again noted in unchanged position. Multi lead cardiac pacemaker. Small bilateral pleural effusions. Bilateral mild interstitial thickening. Right basilar atelectasis. Stable cardiomegaly. Prior median sternotomy. No acute osseous abnormality. IMPRESSION: 1. Cardiomegaly with mild pulmonary vascular congestion. 2. Small bilateral pleural effusions. 3. Support lines and tubing in satisfactory position. Electronically Signed   By: Elige Ko M.D.   On: 09/03/2022 10:04     Medications:     Scheduled Medications:  sodium  chloride   Intravenous Once   aspirin EC  325 mg Oral Daily   atorvastatin  80 mg Per Tube Daily   bisacodyl  10 mg Rectal Daily   Chlorhexidine Gluconate Cloth  6 each Topical Q0600   dorzolamide-timolol   1 drop Both Eyes BID   feeding supplement (PROSource TF20)  60 mL Per Tube Daily   furosemide  80 mg Intravenous BID   insulin aspart  0-24 Units Subcutaneous Q6H   latanoprost  1 drop Both Eyes QHS   mexiletine  250 mg Per Tube BID   mometasone-formoterol  2 puff Inhalation BID   multivitamin with minerals  1 tablet Per Tube Daily   mouth rinse  15 mL Mouth Rinse 4 times per day   pantoprazole (PROTONIX) IV  40 mg Intravenous QHS   polyethylene glycol  17 g Per Tube Daily   sildenafil  40 mg Per Tube TID   sodium chloride flush  10-40 mL Intracatheter Q12H   sodium chloride flush  3 mL Intravenous Q12H   sodium chloride flush  3 mL Intravenous Q12H   umeclidinium bromide  1 puff Inhalation Daily   warfarin  1 mg Oral ONCE-1600   Warfarin - Physician Dosing Inpatient   Does not apply q1600    Infusions:  sodium chloride Stopped (08/30/22 0516)   sodium chloride Stopped (09/01/22 2027)   sodium chloride     sodium chloride Stopped (09/01/22 2043)   sodium chloride     sodium chloride     ceFEPime (MAXIPIME) IV Stopped (09/04/22 1015)   epinephrine 4 mcg/min (09/04/22 1250)   feeding supplement (VITAL 1.5 CAL) 1,000 mL (09/04/22 1151)   heparin 500 Units/hr (09/04/22 1254)   lactated ringers     lactated ringers     lactated ringers 10 mL/hr at 09/04/22 1100   milrinone 0.375 mcg/kg/min (09/04/22 1100)   vancomycin 200 mL/hr at 09/04/22 1100    PRN Medications: sodium chloride, sodium chloride, acetaminophen, albuterol, dextrose, hydrALAZINE, lactated ringers, midazolam, morphine injection, morphine injection, ondansetron (ZOFRAN) IV, mouth rinse, oxyCODONE, sodium chloride flush, sodium chloride flush, sodium chloride flush, traMADol   Assessment:  57 year old patient with sarcoidosis, COPD history of stroke  and nonischemic cardiomyopathy with preoperative cardiogenic shock requiring balloon pump and inotropic therapy.  Postoperative  right middle cerebral artery CVA  treated with neuro-- radiology invention,   Postop RV dysfunction req inhaled NO and inotropes with incresad bilirubin 1.2>> 4.9 Now off inhaled NO and bilirubin improving Plan/Discussion:   RV dysfunction status post VAD implant-continue low-dose epinephrine, wean as tolerated.  Postoperative right middle cerebral CVA.  Initial hemorrhagic extension was mild and has started to improve. Low dose heparin started.  Coumadin dosing is started per pharmacy 1 mg this p.m.  Swallow function improved by FEES- on sips  I reviewed the LVAD parameters from today, and compared the results to the patient's prior recorded data.  No programming changes were made.  The LVAD is functioning within specified parameters.  The patient performs LVAD self-test daily.  LVAD interrogation was negative for any significant power changes, alarms or PI events/speed drops.  LVAD equipment check completed and is in good working order.  Back-up equipment present.   LVAD education done on emergency procedures and precautions and reviewed exit site care.  Length of Stay: 25  Lovett Sox 09/04/2022, 4:44 PM

## 2022-09-04 NOTE — Progress Notes (Signed)
Occupational Therapy Treatment Patient Details Name: Charles Holmes MRN: [id number] DOB: 04-19-1965 Today's Date: 09/04/2022   History of present illness Pt is 57 year old presented to [facility] on  08/10/22 for acute on chronic systolic heart failure. Pt had IABP placed on 08/25/22 for ongoing cardiogenic shock. Underwent placement of LVAD on 8/13. Developed lt sided weakness on 8/14. Rt MCA CVA. Underwent mechanical thrombectomy by IR. Extubated 8/15.  PMH - CAD, CHF, HLD, HTN, systolic HF, ICD implant, rt CVA.  LVAD placement and new stroke R MCA M1.   OT comments  Pt progressing towards goals, able to sit EOB ~10 min and perform x2 sit to stand transfers with max A +2, max A +2 for bed mobility, and min-total A for ADLs. Pt with LUE weakness, however able to form half fist and can raise arm minimally against gravity within limits of precautions, provided with foam squeeze cube. Reviewed sternal prec with pt, will benefit from continued reinforcement. Pt presenting with impairments listed below, will follow acutely. Patient will benefit from intensive inpatient follow up therapy, >3 hours/day to maximize safety/ind with ADLs/functional mobility.   4.3 pump flow 5300 pump speed 4.2 PI 3.9 PP       If plan is discharge home, recommend the following:  Two people to help with walking and/or transfers;Help with stairs or ramp for entrance;Assistance with feeding;Assist for transportation;Assistance with cooking/housework;A lot of help with bathing/dressing/bathroom   Equipment Recommendations  Other (comment) (defer)    Recommendations for Other Services PT consult    Precautions / Restrictions Precautions Precautions: Sternal;Fall;Other (comment) Precaution Comments: LVAD, Swan, chest tubes, multiple lines.  Extubated 8/15. Restrictions Other Position/Activity Restrictions: Sternal Precautions       Mobility Bed Mobility Overal bed mobility: Needs Assistance Bed Mobility: Supine to Sit,  Sit to Supine     Supine to sit: +2 for physical assistance, Max assist, HOB elevated Sit to supine: +2 for physical assistance, Max assist        Transfers Overall transfer level: Needs assistance Equipment used: 2 person hand held assist Transfers: Sit to/from Stand Sit to Stand: Max assist, +2 physical assistance           General transfer comment: use of bed pad     Balance Overall balance assessment: Needs assistance Sitting-balance support: Bilateral upper extremity supported, Feet supported Sitting balance-Leahy Scale: Fair Sitting balance - Comments: sits EOB with min A Postural control: Left lateral lean   Standing balance-Leahy Scale: Poor Standing balance comment: reliant on BUE support                           ADL either performed or assessed with clinical judgement   ADL Overall ADL's : Needs assistance/impaired Eating/Feeding: Set up;Bed level   Grooming: Minimal assistance;Bed level;Sitting   Upper Body Bathing: Moderate assistance;Bed level   Lower Body Bathing: Maximal assistance   Upper Body Dressing : Moderate assistance   Lower Body Dressing: Maximal assistance Lower Body Dressing Details (indicate cue type and reason): donning socks at bed level Toilet Transfer: Maximal assistance;+2 for physical assistance   Toileting- Clothing Manipulation and Hygiene: Total assistance       Functional mobility during ADLs: Maximal assistance;+2 for physical assistance      Extremity/Trunk Assessment Upper Extremity Assessment RUE Deficits / Details: WFL for strength and AROM RUE Sensation: WNL RUE Coordination: WNL LUE Deficits / Details: Minimal ext to fingers, able to achieve 1/2 finger flex.  Minimal wrist flex/ext and supination.  can lift arm against gravity minimally LUE Sensation: decreased light touch LUE Coordination: decreased fine motor;decreased gross motor   Lower Extremity Assessment Lower Extremity Assessment: Defer  to PT evaluation        Vision       Perception Perception Perception: Within Functional Limits   Praxis Praxis Praxis: WFL    Cognition Arousal: Alert Behavior During Therapy: WFL for tasks assessed/performed Overall Cognitive Status: Impaired/Different from baseline Area of Impairment: Problem solving, Awareness, Following commands                       Following Commands: Follows one step commands consistently, Follows one step commands with increased time   Awareness: Emergent Problem Solving: Slow processing, Requires verbal cues General Comments: decr awareness of deficits, oriented to date and follows commands apporpriately        Exercises      Shoulder Instructions       General Comments VSS on RA    Pertinent Vitals/ Pain       Pain Assessment Pain Assessment: No/denies pain  Home Living                                          Prior Functioning/Environment              Frequency  Min 1X/week        Progress Toward Goals  OT Goals(current goals can now be found in the care plan section)  Progress towards OT goals: Progressing toward goals  Acute Rehab OT Goals Patient Stated Goal: none stated OT Goal Formulation: With patient Time For Goal Achievement: 09/14/22 Potential to Achieve Goals: Fair ADL Goals Pt Will Perform Grooming: with contact guard assist;sitting Pt Will Perform Upper Body Bathing: with min assist;sitting Pt Will Perform Upper Body Dressing: with mod assist;sitting Pt Will Transfer to Toilet: with mod assist;squat pivot transfer;bedside commode Additional ADL Goal #1: Tolerate sitting EOB up to 5 min with supervision to increase independince with toileting.  Plan      Co-evaluation                 AM-PAC OT "6 Clicks" Daily Activity     Outcome Measure   Help from another person eating meals?: A Lot Help from another person taking care of personal grooming?: A Lot Help from  another person toileting, which includes using toliet, bedpan, or urinal?: Total Help from another person bathing (including washing, rinsing, drying)?: A Lot Help from another person to put on and taking off regular upper body clothing?: A Lot Help from another person to put on and taking off regular lower body clothing?: Total 6 Click Score: 10    End of Session    OT Visit Diagnosis: Muscle weakness (generalized) (M62.81)   Activity Tolerance Patient tolerated treatment well   Patient Left in bed;with call bell/phone within reach;with nursing/sitter in room   Nurse Communication Mobility status        Time: 1610-9604 OT Time Calculation (min): 28 min  Charges: OT General Charges $OT Visit: 1 Visit OT Treatments $Self Care/Home Management : 8-22 mins  Carver Fila, OTD, OTR/L SecureChat Preferred Acute Rehab [phone]   Carver Fila Koonce 09/04/2022, 9:23 AM

## 2022-09-04 NOTE — Progress Notes (Signed)
LVAD Coordinator Rounding Note:  Admitted 08/10/22 due to acute on chronic CHF with cardiogenic shock. Milrinone dependent. Advance therapy workup completed, and pt deemed acceptable VAD candidate. Dental extractions completed 8/6. IABP placed 08/25/22.  HM 3 LVAD implanted on 08/29/22 by Dr Donata Clay under destination therapy criteria. 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 Result pending.  7/25 Admit with cardiogenic shock. Started milrinone and NE. 8/6 S/P 13 teeth extractions  8/9 IABP placed 8/13 S/p HM III LVAD implant + clipping left atrial appendage d/t severe thickening and invagination of mitral valve annulus impeding flows  CT Head 8/14 (initial) 1. 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 ASPECTS is 7. 2. No acute hemorrhage.  CT Angio Head/Neck 1. 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 2. Core infarct of 12 cc (somewhat underestimated compared to aspects) with 90 cc of penumbra. 3. Mild atherosclerosis.  Pt taken emergently to IR for percutaneous right common carotid arteriogram with thrombectomy. Revascularization achieved. Angio-seal closure device applied to left groin- clean, dry, and intact.   CT Head 8/15 @ 0450 Unchanged extent of infarct and hemorrhage in the right MCA distribution including small volume intraventricular clot. No hydrocephalus.  CT Head 8/17 @ 0701 Interval evolution of the right MCA territory infarct with decreased intraparenchymal and intraventricular hemorrhage. No hydrocephalus or midline shift.   DYS I diet. Pt is A and O. CT will be removed today.  Currently on Cefepime 2 g q 8 hrs and Vancomycin 1000 mg q 12 hrs.   Vital signs: Temp: 100 HR: 108 Doppler Pressure: 82 Arterial BP: 82/55 (65) O2 Sat: 95% on 10 L HFNC Wt: 183.6>191.1>190.9>190.2lbs   LVAD interrogation reveals:  Speed: 5300 Flow: 4.6 Power: 3.8 w PI: 3.2  Alarms: none Events:  10 PI  today  Hematocrit: 20-DO NOT ADJUST Fixed speed: 5300 Low speed limit: 5000  Drive Line: Existing VAD dressing removed and site care performed using sterile technique. Drive line exit site cleaned with Chlora prep applicators x 2, allowed to dry, and silver strip with gauze applied. Exit site healing and unincorporated, the velour is fully implanted at exit site.  No redness, drainage, tenderness, foul odor or rash noted. 2 suture in place. Drive line anchor secure. Pt has some skin breakdown from tape and 2 blisters...these areas were left OTA. Advance to every other day dressing changes to hopefully keep from pt from having further skin breakdown from tape. Next dressing change due 09/06/22 by VAD coordinator or nurse champion only.       Labs:  LDH trend: [phone]  INR trend: 1.4>1.6>1.1  AST/ALT trend: 218/45>96/30>73/23>63/36  Total Bili trend: 4.4>4.6>5.1>1.8  WBC trend: 11.6>13.7>19.6>16.1  Anticoagulation Plan: -INR Goal: 2.0 - 2.5 -ASA Dose: 325 mg  -Heparin 500 u/hr  Blood Products:  IntraOp 8/14: - 4 FFP - 2 Platelets - 2 PRBC - 1 cyro - 449 cc of cellsaver - DDAVP 20 mcg x 1   Device: [employer] BiV -  -Therapies: OFF  Arrythmias:   Respiratory: 10 L high flow  Infection:  09/01/22>> sputum cx>> pending 09/01/22>> blood cx>> pending 09/01/22>> urine cx>> pending  Renal:  -BUN/CRT: 11/0.84  Adverse Events on VAD: - 08/30/22: - Developed left sided weakness this am. CTA with R MCA infarct. Taken to IR for thrombectomy   Drips:  Epinephrine 4 mcg/min Milrinone 0.375 mcg/kg/min   Patient Education: Discussed and demonstrated power source change with pt. Left extremity too weak at  this time to perform power change on his own.  No family at bedside.   Plan/Recommendations:  Please page VAD coordinator for any alarms or VAD equipment issues. Every other day dressing changes by VAD coordinator or Nurse Phyllis Ginger RN VAD  Coordinator  Office: [phone]  24/7 Pager: [phone]

## 2022-09-04 NOTE — Progress Notes (Signed)
Physical Therapy Treatment Patient Details Name: Charles Holmes MRN: [id number] DOB: 1965-09-29 Today's Date: 09/04/2022   History of Present Illness Pt is 57 year old presented to [facility] on  08/10/22 for acute on chronic systolic heart failure. Pt had IABP placed on 08/25/22 for ongoing cardiogenic shock. Underwent placement of LVAD on 8/13. Developed lt sided weakness on 8/14. Rt MCA CVA. Underwent mechanical thrombectomy by IR. Extubated 8/15.  PMH - CAD, CHF, HLD, HTN, systolic HF, ICD implant, rt CVA.  LVAD placement and new stroke R MCA M1.    PT Comments  Pt progressing well towards all goals. OOB mobility limited today by tenuous swan line however pt was able to stand EOB this date x2. Pt continues with c/o L UE/LE weakness, left lateral lean in sitting and standing. Pt eager to mobilize and demos excellent rehab potential. Recommend inpatient rehab program >3 hours a day to progress towards indep mobility to transition home with spouse. Acute PT to cont to follow.    If plan is discharge home, recommend the following:     Can travel by private vehicle        Equipment Recommendations  Other (comment) (To be determined)    Recommendations for Other Services       Precautions / Restrictions Precautions Precautions: Sternal;Fall;Other (comment) Precaution Comments: LVAD, Swan, chest tubes, multiple lines.  Extubated 8/15. Restrictions Weight Bearing Restrictions: Yes (Sternal precautions) RUE Weight Bearing: Non weight bearing RLE Weight Bearing: Non weight bearing Other Position/Activity Restrictions: Sternal Precautions     Mobility  Bed Mobility Overal bed mobility: Needs Assistance Bed Mobility: Supine to Sit, Sit to Supine     Supine to sit: +2 for physical assistance, Max assist, HOB elevated Sit to supine: +2 for physical assistance, Max assist   General bed mobility comments: Assist for all aspects, RN present to manage lines, pt with less functional ability to  assist with L UE and LE    Transfers Overall transfer level: Needs assistance Equipment used: 2 person hand held assist Transfers: Sit to/from Stand Sit to Stand: Max assist, +2 physical assistance           General transfer comment: completed 2 sit to stands from EOB with use of bed pad, 2nd stand attempted to side step to [facility], pt with L lateral bias requiring maxA to weight shift to the R to attempt to advance L LE, very labored, due to tenuous swan line limited to sit/stand EOB and unable to transfer to recliner safely    Ambulation/Gait               General Gait Details: limited to side steps along [facility]   Stairs             Wheelchair Mobility     Tilt Bed    Modified Rankin (Stroke Patients Only)       Balance Overall balance assessment: Needs assistance Sitting-balance support: Bilateral upper extremity supported, Feet supported Sitting balance-Leahy Scale: Fair Sitting balance - Comments: sits EOB with min A Postural control: Left lateral lean   Standing balance-Leahy Scale: Poor Standing balance comment: reliant on BUE support                            Cognition Arousal: Alert Behavior During Therapy: WFL for tasks assessed/performed Overall Cognitive Status: Impaired/Different from baseline Area of Impairment: Problem solving, Awareness, Following commands, Safety/judgement, Memory  Memory: Decreased recall of precautions (requires constant verbal/tactile cues to adhere to sternal precautions functionally) Following Commands: Follows one step commands consistently, Follows one step commands with increased time Safety/Judgement: Decreased awareness of safety Awareness: Emergent Problem Solving: Slow processing, Requires verbal cues General Comments: decr awareness of deficits, oriented to date and follows commands apporpriately        Exercises General Exercises - Lower Extremity Ankle  Circles/Pumps: AROM, Both, 10 reps, Supine Quad Sets: AROM, Both, 10 reps, Supine    General Comments General comments (skin integrity, edema, etc.): VSS on RA, L UE with edema, ace wrap removed as pt's hand and elbow with significant swelling as ace wrap more of tournaquit around L wrist      Pertinent Vitals/Pain Pain Assessment Pain Assessment: No/denies pain    Home Living                          Prior Function            PT Goals (current goals can now be found in the care plan section) Acute Rehab PT Goals Patient Stated Goal: get up PT Goal Formulation: With patient Time For Goal Achievement: 09/14/22 Potential to Achieve Goals: Fair Progress towards PT goals: Progressing toward goals    Frequency    Min 1X/week      PT Plan Current plan remains appropriate    Co-evaluation PT/OT/SLP Co-Evaluation/Treatment: Yes Reason for Co-Treatment: For patient/therapist safety PT goals addressed during session: Mobility/safety with mobility        AM-PAC PT "6 Clicks" Mobility   Outcome Measure  Help needed turning from your back to your side while in a flat bed without using bedrails?: Total Help needed moving from lying on your back to sitting on the side of a flat bed without using bedrails?: Total Help needed moving to and from a bed to a chair (including a wheelchair)?: Total Help needed standing up from a chair using your arms (e.g., wheelchair or bedside chair)?: Total Help needed to walk in hospital room?: Total Help needed climbing 3-5 steps with a railing? : Total 6 Click Score: 6    End of Session Equipment Utilized During Treatment: Oxygen Activity Tolerance: Other (comment) (limited by extensive lines) Patient left: in bed;with call bell/phone within reach;with nursing/sitter in room Nurse Communication: Mobility status (nurses assisting with mobility) PT Visit Diagnosis: Other abnormalities of gait and mobility (R26.89);Difficulty in  walking, not elsewhere classified (R26.2);Hemiplegia and hemiparesis Hemiplegia - Right/Left: Left Hemiplegia - dominant/non-dominant: Non-dominant Hemiplegia - caused by: Cerebral infarction     Time: 0841-0909 PT Time Calculation (min) (ACUTE ONLY): 28 min  Charges:    $Therapeutic Activity: 8-22 mins PT General Charges $$ ACUTE PT VISIT: 1 Visit                     [name], PT, DPT Acute Rehabilitation Services Secure chat preferred Office #: [phone]    [name] 09/04/2022, 9:52 AM

## 2022-09-05 ENCOUNTER — Inpatient Hospital Stay (HOSPITAL_COMMUNITY): Payer: Medicare Other

## 2022-09-05 DIAGNOSIS — I5023 Acute on chronic systolic (congestive) heart failure: Secondary | ICD-10-CM | POA: Diagnosis not present

## 2022-09-05 DIAGNOSIS — I428 Other cardiomyopathies: Secondary | ICD-10-CM | POA: Diagnosis not present

## 2022-09-05 LAB — COMPREHENSIVE METABOLIC PANEL
ALT: 37 U/L (ref 0–44)
AST: 58 U/L — ABNORMAL HIGH (ref 15–41)
Albumin: 1.9 g/dL — ABNORMAL LOW (ref 3.5–5.0)
Alkaline Phosphatase: 131 U/L — ABNORMAL HIGH (ref 38–126)
Anion gap: 9 (ref 5–15)
BUN: 25 mg/dL — ABNORMAL HIGH (ref 6–20)
CO2: 30 mmol/L (ref 22–32)
Calcium: 8 mg/dL — ABNORMAL LOW (ref 8.9–10.3)
Chloride: 93 mmol/L — ABNORMAL LOW (ref 98–111)
Creatinine, Ser: 0.73 mg/dL (ref 0.61–1.24)
GFR, Estimated: >60 mL/min (ref >60)
Glucose, Bld: 147 mg/dL — ABNORMAL HIGH (ref 70–99)
Potassium: 3.6 mmol/L (ref 3.5–5.1)
Sodium: 132 mmol/L — ABNORMAL LOW (ref 135–145)
Total Bilirubin: 1.9 mg/dL — ABNORMAL HIGH (ref 0.3–1.2)
Total Protein: 5.1 g/dL — ABNORMAL LOW (ref 6.5–8.1)

## 2022-09-05 LAB — CBC
HCT: 30.3 % — ABNORMAL LOW (ref 39.0–52.0)
HCT: 28.7 % — ABNORMAL LOW (ref 39.0–52.0)
Hemoglobin: 9.7 g/dL — ABNORMAL LOW (ref 13.0–17.0)
Hemoglobin: 9.1 g/dL — ABNORMAL LOW (ref 13.0–17.0)
MCH: 31.6 pg (ref 26.0–34.0)
MCH: 30.6 pg (ref 26.0–34.0)
MCHC: 32 g/dL (ref 30.0–36.0)
MCHC: 31.7 g/dL (ref 30.0–36.0)
MCV: 98.7 fL (ref 80.0–100.0)
MCV: 96.6 fL (ref 80.0–100.0)
Platelets: 395 10*3/uL (ref 150–400)
Platelets: 400 10*3/uL (ref 150–400)
RBC: 3.07 MIL/uL — ABNORMAL LOW (ref 4.22–5.81)
RBC: 2.97 MIL/uL — ABNORMAL LOW (ref 4.22–5.81)
RDW: 17.4 % — ABNORMAL HIGH (ref 11.5–15.5)
RDW: 17.5 % — ABNORMAL HIGH (ref 11.5–15.5)
WBC: 23 10*3/uL — ABNORMAL HIGH (ref 4.0–10.5)
WBC: 29 10*3/uL — ABNORMAL HIGH (ref 4.0–10.5)
nRBC: 0.3 % — ABNORMAL HIGH (ref 0.0–0.2)
nRBC: 0.2 % (ref 0.0–0.2)

## 2022-09-05 LAB — GLUCOSE, CAPILLARY
Glucose-Capillary: 172 mg/dL — ABNORMAL HIGH (ref 70–99)
Glucose-Capillary: 148 mg/dL — ABNORMAL HIGH (ref 70–99)
Glucose-Capillary: 173 mg/dL — ABNORMAL HIGH (ref 70–99)
Glucose-Capillary: 186 mg/dL — ABNORMAL HIGH (ref 70–99)
Glucose-Capillary: 166 mg/dL — ABNORMAL HIGH (ref 70–99)

## 2022-09-05 LAB — BASIC METABOLIC PANEL
Anion gap: 10 (ref 5–15)
BUN: 24 mg/dL — ABNORMAL HIGH (ref 6–20)
CO2: 31 mmol/L (ref 22–32)
Calcium: 7.7 mg/dL — ABNORMAL LOW (ref 8.9–10.3)
Chloride: 93 mmol/L — ABNORMAL LOW (ref 98–111)
Creatinine, Ser: 0.72 mg/dL (ref 0.61–1.24)
GFR, Estimated: >60 mL/min (ref >60)
Glucose, Bld: 190 mg/dL — ABNORMAL HIGH (ref 70–99)
Potassium: 3.4 mmol/L — ABNORMAL LOW (ref 3.5–5.1)
Sodium: 134 mmol/L — ABNORMAL LOW (ref 135–145)

## 2022-09-05 LAB — COOXEMETRY PANEL
Carboxyhemoglobin: 2.1 % — ABNORMAL HIGH (ref 0.5–1.5)
Carboxyhemoglobin: 2.6 % — ABNORMAL HIGH (ref 0.5–1.5)
Methemoglobin: 0.8 % (ref 0.0–1.5)
Methemoglobin: 1.1 % (ref 0.0–1.5)
O2 Saturation: 63.1 %
O2 Saturation: 71.6 %
Total hemoglobin: 10.2 g/dL — ABNORMAL LOW (ref 12.0–16.0)
Total hemoglobin: 9 g/dL — ABNORMAL LOW (ref 12.0–16.0)

## 2022-09-05 LAB — PROTIME-INR
INR: 1.1 (ref 0.8–1.2)
Prothrombin Time: 14.2 s (ref 11.4–15.2)

## 2022-09-05 LAB — CG4 I-STAT (LACTIC ACID)
Lactic Acid, Venous: 2 mmol/L (ref 0.5–1.9)
Lactic Acid, Venous: 1.5 mmol/L (ref 0.5–1.9)

## 2022-09-05 LAB — HEPARIN LEVEL (UNFRACTIONATED): Heparin Unfractionated: <0.1 [IU]/mL — ABNORMAL LOW (ref 0.30–0.70)

## 2022-09-05 LAB — LACTATE DEHYDROGENASE: LDH: 345 U/L — ABNORMAL HIGH (ref 98–192)

## 2022-09-05 LAB — APTT: aPTT: 32 s (ref 24–36)

## 2022-09-05 LAB — MAGNESIUM: Magnesium: 1.9 mg/dL (ref 1.7–2.4)

## 2022-09-05 LAB — PHOSPHORUS: Phosphorus: 2.9 mg/dL (ref 2.5–4.6)

## 2022-09-05 MED ORDER — ENSURE ENLIVE PO LIQD
237.0000 mL | Freq: Three times a day (TID) | ORAL | Status: DC
  Administered 2022-09-05 – 2022-09-26 (×49): 237 mL via ORAL
  Filled 2022-09-05: qty 237

## 2022-09-05 MED ORDER — SODIUM CHLORIDE 0.9 % IV SOLN
2.0000 g | Freq: Three times a day (TID) | INTRAVENOUS | Status: AC
  Administered 2022-09-05 – 2022-09-11 (×21): 2 g via INTRAVENOUS
  Filled 2022-09-05 (×24): qty 40

## 2022-09-05 MED ORDER — SODIUM CHLORIDE 0.9 % IV BOLUS
250.0000 mL | Freq: Once | INTRAVENOUS | Status: AC
  Administered 2022-09-05: 250 mL via INTRAVENOUS

## 2022-09-05 MED ORDER — VITAL 1.5 CAL PO LIQD
1000.0000 mL | ORAL | Status: DC
  Administered 2022-09-05 – 2022-09-06 (×2): 1000 mL

## 2022-09-05 MED ORDER — ASPIRIN 81 MG PO CHEW
324.0000 mg | CHEWABLE_TABLET | Freq: Every day | ORAL | Status: DC
  Administered 2022-09-05 – 2022-09-08 (×4): 324 mg
  Filled 2022-09-05 (×4): qty 4

## 2022-09-05 MED ORDER — SILDENAFIL CITRATE 20 MG PO TABS
20.0000 mg | ORAL_TABLET | Freq: Three times a day (TID) | ORAL | Status: DC
  Administered 2022-09-05 – 2022-09-11 (×19): 20 mg
  Filled 2022-09-05 (×20): qty 1

## 2022-09-05 MED ORDER — POTASSIUM CHLORIDE 20 MEQ PO PACK
20.0000 meq | PACK | ORAL | Status: AC
  Administered 2022-09-05 (×3): 20 meq
  Filled 2022-09-05 (×3): qty 1

## 2022-09-05 MED ORDER — POTASSIUM CHLORIDE 20 MEQ PO PACK
20.0000 meq | PACK | ORAL | Status: AC
  Administered 2022-09-05 – 2022-09-06 (×3): 20 meq
  Filled 2022-09-05 (×3): qty 1

## 2022-09-05 MED ORDER — MELATONIN 3 MG PO TABS
3.0000 mg | ORAL_TABLET | Freq: Every day | ORAL | Status: DC
  Administered 2022-09-05 – 2022-09-26 (×22): 3 mg via ORAL
  Filled 2022-09-05 (×22): qty 1

## 2022-09-05 MED ORDER — BANATROL TF EN LIQD
60.0000 mL | Freq: Two times a day (BID) | ENTERAL | Status: DC
  Administered 2022-09-05 – 2022-09-08 (×6): 60 mL
  Filled 2022-09-05 (×6): qty 60

## 2022-09-05 MED ORDER — WARFARIN - PHARMACIST DOSING INPATIENT: Freq: Every day | Status: DC

## 2022-09-05 MED ORDER — WARFARIN SODIUM 2.5 MG PO TABS
2.5000 mg | ORAL_TABLET | Freq: Once | ORAL | Status: AC
  Administered 2022-09-05: 2.5 mg via ORAL
  Filled 2022-09-05: qty 1

## 2022-09-05 MED ORDER — QUETIAPINE FUMARATE 25 MG PO TABS
25.0000 mg | ORAL_TABLET | Freq: Every day | ORAL | Status: DC
  Administered 2022-09-05 – 2022-09-07 (×3): 25 mg via ORAL
  Filled 2022-09-05 (×3): qty 1

## 2022-09-05 MED ORDER — PROSOURCE TF20 ENFIT COMPATIBL EN LIQD
60.0000 mL | Freq: Two times a day (BID) | ENTERAL | Status: DC
  Administered 2022-09-05 – 2022-09-17 (×24): 60 mL
  Filled 2022-09-05 (×24): qty 60

## 2022-09-05 NOTE — Progress Notes (Signed)
VAD Coordinator called about inconsistent blood pressure cuff vs doppler pressures. Blood pressure and doppler personally taken. Automatic BP 77/47 (58) Doppler 74; doppler modified systolic. Discussed with Dr. Gasper Lloyd will hold evening dose of Lasix and give 250cc bolus. Bedside nurse aware.   Simmie Davies RN, BSN VAD Coordinator 24/7 Pager [phone]

## 2022-09-05 NOTE — Progress Notes (Signed)
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  Intraoperatively the patient had moderate RV dysfunction and following VAD implantation there was some obstruction to the inflow through the mitral valve from posterior annular thickening/ hematoma which was not clearly defined as the inflow cannula was in good position and far removed from the mitral valve.  After application of a left atrial clip the inflow appeared to be improved.  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 Off anticoagulation now  due to mild hemorrhagic extension by f/u CT.  Patient slowly progressing due to L side weakness, working with PT Low grade fever and WBC up 23k, cultures have been negative- expand coverage with Merropenum and DC R internal jugular central line. Sternal incusion and VAD exit site look good, CXR with mild edema  PharmD is slowly loading coumadin with low dose heparin bridge    LVAD INTERROGATION:  HeartMate II LVAD:  Flow 4.4  liters/min, speed 5300 rpm, power 3.6, PI 4.1.  Controller intact.   Objective:    Vital Signs:   Temp:  [97.7 F (36.5 C)-99.9 F (37.7 C)] 98.4 F (36.9 C) (08/20 0745) Pulse Rate:  [104-251] 118 (08/20 0730) Resp:  [16-25] 22 (08/20 0730) BP: (77-146)/(52-110) 93/80 (08/20 0700) SpO2:  [73 %-98 %] 92 % (08/20 0730) Weight:  [83.8  kg] 83.8 kg (08/20 0500) Last BM Date : 09/04/22 Mean arterial Pressure 88-92 mm Hg  Intake/Output:  Exam  General -alert moving L side better, speech better HEENT:-No bleeding from mouth at extraction sits Neck: supple. JVP . Carotids without bruits. No lymphadenopathy or thryomegaly appreciated. Cor: Mechanical heart sounds with LVAD hum present. Lungs: clear Abdomen: soft, nontender, nondistended. No hepatosplenomegaly. No bruits or masses. Good bowel sounds. Extremities: no cyanosis, clubbing, rash, edema Neuro: alert & orientedx3, cranial nerves grossly intact. moves all 4 extremities . Affect pleasant  Telemetry: paced with PPM 90/min  Labs: Basic Metabolic Panel: Recent Labs  Lab 09/01/22 0500 09/01/22 1612 09/02/22 0527 09/02/22 1633 09/03/22 0333 09/04/22 0447 09/05/22 0348  NA 134*  --  134*  --  136 134* 132*  K 4.4  --  3.7  --  4.4 4.1 3.6  CL 104  --  105  --  103 96* 93*  CO2 22  --  24  --  26 28 30   GLUCOSE 112*  --  152*  --  183* 134* 147*  BUN 17  --  22*  --  25* 22* 25*  CREATININE 0.74  --  0.91  --  0.85 0.68 0.73  CALCIUM 8.4*  --  8.2*  --  8.2* 8.2* 8.0*  MG 2.3   < > 2.1 2.0 1.8 1.8 1.9  PHOS 3.1   < > 2.4* 2.3* 1.7* 2.3* 2.9   < > = values in this interval not displayed.    Liver Function Tests: Recent Labs  Lab 09/01/22 0500 09/02/22 0272 09/03/22 5366 09/04/22 0447 09/05/22 0348  AST 73* 65* 68* 63* 58*  ALT 23 27 34 36 37  ALKPHOS 58 97 147* 156* 131*  BILITOT 5.1* 4.2* 2.7* 1.8* 1.9*  PROT 5.3* 5.0* 5.5* 5.3* 5.1*  ALBUMIN 2.7* 2.4* 2.3* 2.2* 1.9*   No results for input(s): "LIPASE", "AMYLASE" in the last 168 hours. No results for input(s): "AMMONIA" in the last 168 hours.  CBC: Recent Labs  Lab 08/30/22 0258 08/30/22 0304 08/31/22 0310 08/31/22 0329 09/01/22 0500 09/02/22 0527 09/03/22 0333 09/04/22 0447 09/05/22 0348  WBC 11.6*   < > 13.7*  --  19.6* 12.6* 13.8* 16.1* 23.0*  NEUTROABS 9.9*  --  11.0*  --   --    --   --   --   --   HGB 9.0*   < > 7.9*   < > 9.5* 8.5* 9.4* 9.9* 9.7*  HCT 27.3*   < > 24.8*   < > 30.0* 27.4* 30.7* 31.3* 30.3*  MCV 96.8   < > 100.0  --  99.3 99.6 100.7* 97.8 98.7  PLT 167   < > 139*  --  174 195 260 360 395   < > = values in this interval not displayed.    INR: Recent Labs  Lab 09/01/22 0500 09/02/22 0527 09/03/22 0333 09/04/22 0447 09/05/22 0348  INR 1.5* 1.3* 1.2 1.1 1.1    Other results: EKG:   Imaging: DG Chest Port 1 View  Result Date: 09/05/2022 CLINICAL DATA:  History of LVAD EXAM: PORTABLE CHEST 1 VIEW COMPARISON:  Chest radiograph dated 09/04/2022 FINDINGS: Lines/tubes: Interval removal of PA catheter with right IJ sheath in-situ. Left chest wall ICD leads project over the right atrium and ventricle and tributary of the coronary sinus. LVAD in-situ. Enteric tube tip reaches the diaphragm and terminates below the field of view. Interval placement of right upper extremity PICC. Tip projects over the superior cavoatrial junction. Lungs: Bilateral interstitial opacities. Slightly increased dense retrocardiac opacities. Pleura: Possible small right pleural effusion. Left costophrenic angle is obscured. No pneumothorax. Heart/mediastinum: Similar enlarged postsurgical cardiomediastinal silhouette. Bones: Median sternotomy wires are nondisplaced. IMPRESSION: 1. Interval removal of PA catheter with right IJ sheath in-situ. 2. Interval placement of right upper extremity PICC with tip projecting over the superior cavoatrial junction. 3. Slightly increased dense retrocardiac opacities, likely atelectasis. 4. Possible small right pleural effusion. Electronically Signed   By: Agustin Cree M.D.   On: 09/05/2022 08:42   ECHOCARDIOGRAM LIMITED  Result Date: 09/04/2022    ECHOCARDIOGRAM LIMITED REPORT   Patient Name:   Charles Holmes Date of Exam: 09/04/2022 Medical Rec #:  [id number]       Height:       67.0 in Accession #:    [id number]      Weight:       190.3 lb Date of  Birth:  [birth year]       BSA:          1.980 m Patient Age:    57 years        BP:           97/80 mmHg Patient Gender: M               HR:           104 bpm. Exam Location:  Inpatient Procedure: Limited Echo and Limited Color Doppler Indications:    CHF I50.9  History:        Patient has prior history of Echocardiogram examinations, most  recent 08/24/2022. CHF, CAD, Stroke and COPD, Arrythmias:Atrial                 Fibrillation and Tachycardia; Risk Factors:Hypertension and                 Dyslipidemia.  Sonographer:    Lucendia Herrlich Referring Phys: [phone] Omarr Hann IMPRESSIONS  1. LVAD at LV apex. No subcostal images.  2. Left ventricular ejection fraction, by estimation, is <20%. The left ventricle has severely decreased function. The left ventricle demonstrates global hypokinesis. The left ventricular internal cavity size was severely dilated.  3. Right ventricular systolic function is severely reduced. The right ventricular size is normal.  4. Left atrial size was mildly dilated.  5. Right atrial size was mildly dilated.  6. The mitral valve is normal in structure. No evidence of mitral valve regurgitation. No evidence of mitral stenosis.  7. The aortic valve has an indeterminant number of cusps. Aortic valve regurgitation is not visualized. FINDINGS  Left Ventricle: Left ventricular ejection fraction, by estimation, is <20%. The left ventricle has severely decreased function. The left ventricle demonstrates global hypokinesis. The left ventricular internal cavity size was severely dilated. There is no left ventricular hypertrophy. Right Ventricle: The right ventricular size is normal. Right ventricular systolic function is severely reduced. Left Atrium: Left atrial size was mildly dilated. Right Atrium: Right atrial size was mildly dilated. Pericardium: There is no evidence of pericardial effusion. Mitral Valve: The mitral valve is normal in structure. No evidence of mitral valve stenosis.  Tricuspid Valve: The tricuspid valve is normal in structure. Tricuspid valve regurgitation is mild . No evidence of tricuspid stenosis. Aortic Valve: The aortic valve has an indeterminant number of cusps. Aortic valve regurgitation is not visualized. Pulmonic Valve: The pulmonic valve was not well visualized. Aorta: The aortic root is normal in size and structure. Additional Comments: LVAD at LV apex. No subcostal images. A venous catheter is visualized.  RIGHT VENTRICLE RV S prime:     7.77 cm/s TRICUSPID VALVE TR Peak grad:   40.7 mmHg TR Vmax:        319.00 cm/s Olga Millers MD Electronically signed by Olga Millers MD Signature Date/Time: 09/04/2022/12:45:59 PM    Final    DG Chest Port 1 View  Result Date: 09/04/2022 CLINICAL DATA:  [id number] LVAD (left ventricular assist device) present [facility]) [id number] EXAM: PORTABLE CHEST 1 VIEW COMPARISON:  09/03/2022 FINDINGS: Right IJ approach pulmonary arterial catheter terminates within the central pulmonary outflow. Feeding tube courses below the diaphragm. Unchanged positioning of left ventricular assist device. Pacemaker in place. Stable cardiomegaly status post sternotomy. Bilateral chest tubes have been removed. Mild interstitial prominence bilaterally with probable trace bilateral pleural effusions. Bibasilar atelectasis. No pneumothorax. IMPRESSION: 1. Interval removal of bilateral chest tubes. No pneumothorax. 2. Mild interstitial prominence bilaterally with probable trace bilateral pleural effusions. Electronically Signed   By: Duanne Guess D.O.   On: 09/04/2022 10:22   [place] EKG SITE RITE  Result Date: 09/04/2022 If Site Rite image not attached, placement could not be confirmed due to current cardiac rhythm.    Medications:     Scheduled Medications:  sodium chloride   Intravenous Once   aspirin EC  325 mg Oral Daily   atorvastatin  80 mg Per Tube Daily   bisacodyl  10 mg Rectal Daily   Chlorhexidine Gluconate Cloth  6 each Topical Q0600    dorzolamide-timolol  1 drop Both Eyes BID   feeding supplement (PROSource TF20)  60 mL Per Tube Daily   furosemide  80 mg Intravenous BID   insulin aspart  0-24 Units Subcutaneous Q6H   latanoprost  1 drop Both Eyes QHS   mexiletine  250 mg Per Tube BID   mometasone-formoterol  2 puff Inhalation BID   multivitamin with minerals  1 tablet Per Tube Daily   mouth rinse  15 mL Mouth Rinse 4 times per day   pantoprazole (PROTONIX) IV  40 mg Intravenous QHS   polyethylene glycol  17 g Per Tube Daily   potassium chloride  20 mEq Per Tube Q4H   sildenafil  40 mg Per Tube TID   sodium chloride flush  10-40 mL Intracatheter Q12H   sodium chloride flush  3 mL Intravenous Q12H   sodium chloride flush  3 mL Intravenous Q12H   umeclidinium bromide  1 puff Inhalation Daily   warfarin  2.5 mg Oral ONCE-1600   Warfarin - Pharmacist Dosing Inpatient   Does not apply q1600    Infusions:  sodium chloride Stopped (08/30/22 0516)   sodium chloride Stopped (09/01/22 2027)   sodium chloride     sodium chloride Stopped (09/01/22 2043)   sodium chloride     sodium chloride     epinephrine 5 mcg/min (09/05/22 0851)   feeding supplement (VITAL 1.5 CAL) 60 mL/hr at 09/05/22 0700   heparin 500 Units/hr (09/05/22 0700)   lactated ringers     lactated ringers     lactated ringers Stopped (09/04/22 1658)   meropenem (MERREM) IV     milrinone 0.375 mcg/kg/min (09/05/22 0850)   vancomycin Stopped (09/05/22 0020)    PRN Medications: sodium chloride, sodium chloride, acetaminophen, albuterol, dextrose, hydrALAZINE, lactated ringers, midazolam, morphine injection, morphine injection, ondansetron (ZOFRAN) IV, mouth rinse, oxyCODONE, sodium chloride flush, sodium chloride flush, sodium chloride flush, traMADol   Assessment:  57 year old patient with sarcoidosis, COPD history of stroke  and nonischemic cardiomyopathy with preoperative cardiogenic shock requiring balloon pump and inotropic  therapy.  Postoperative  right middle cerebral artery CVA treated with neuro-- radiology invention,   Postop RV dysfunction req inhaled NO and inotropes with incresad bilirubin 1.2>> 4.9 Now off inhaled NO and bilirubin improving Plan/Discussion:   RV dysfunction status post VAD implant-continue low-dose epinephrine, wean as tolerated.  Postoperative right middle cerebral CVA.  Initial hemorrhagic extension was mild and has started to improve. Low dose heparin started.  Coumadin dosing has started by PharmD  Swallow function improved by FEES- on sips  Patient needs central line out, OOB to chair and  Meropenum/ Vanc  I reviewed the LVAD parameters from today, and compared the results to the patient's prior recorded data.  No programming changes were made.  The LVAD is functioning within specified parameters.  The patient performs LVAD self-test daily.  LVAD interrogation was negative for any significant power changes, alarms or PI events/speed drops.  LVAD equipment check completed and is in good working order.  Back-up equipment present.   LVAD education done on emergency procedures and precautions and reviewed exit site care.  Length of Stay: 26  Lovett Sox 09/05/2022, 9:30 AM

## 2022-09-05 NOTE — Progress Notes (Signed)
ANTICOAGULATION CONSULT NOTE  Pharmacy Consult for heparin + Coumadin Indication:  LVAD  Allergies  Allergen Reactions   Amiodarone     Severe tremors   Percocet [Oxycodone-Acetaminophen] Itching    Patient Measurements: Height: 5\' 7"  (170.2 cm) Weight: 83.8 kg (184 lb 11.9 oz) IBW/kg (Calculated) : 66.1 Heparin Dosing Weight: 87kg  Vital Signs: Temp: 98.4 F (36.9 C) (08/20 0745) Temp Source: Oral (08/20 0745) BP: 93/80 (08/20 0700) Pulse Rate: 118 (08/20 0730)  Labs: Recent Labs    09/03/22 0333 09/04/22 0447 09/05/22 0348  HGB 9.4* 9.9* 9.7*  HCT 30.7* 31.3* 30.3*  PLT 260 360 395  APTT 34 30 32  LABPROT 15.6* 14.1 14.2  INR 1.2 1.1 1.1  HEPARINUNFRC <0.10* <0.10* <0.10*  CREATININE 0.85 0.68 0.73    Estimated Creatinine Clearance: 102.9 mL/min (by C-G formula based on SCr of 0.73 mg/dL).   Medical History: Past Medical History:  Diagnosis Date   CAD (coronary artery disease)    CHF (congestive heart failure) (HCC)    GERD (gastroesophageal reflux disease)    Hyperlipidemia    Hypertension    Systolic heart failure (HCC) 2021   LVEF 18%, RVEF 38% on cardiac MRI 12/19/2019. possible cardiac sarcoidosis.   Wide-complex tachycardia 2021   wears LifeVest     Assessment: 57yoM on apixaban PTA for hx AF admitted for LVAD workup. Pt s/p HM3 implant on 8/13 c/b acute CVA postop. Head CT 8/17 stable, ok to start low dose heparin per neuro.   Heparin level <0.1 as expected, H/H and pltc stable.  Coumadin initiated 8/19 w/ 1 mg.  Pharmacy asked to assist with dosing today.  Goal of Therapy:  Heparin level <0.1 units/ml Monitor platelets by anticoagulation protocol: Yes   Plan:  Continue Heparin 500 units/h - no titrations planned for now Daily heparin level and CBC Coumadin 2.5 mg po x 1 tonight Daily INR.  Reece Leader, Colon Flattery, [facility] Clinical Pharmacist  09/05/2022 8:57 AM   [facility] pharmacy phone numbers are listed on [email or web address]

## 2022-09-05 NOTE — Progress Notes (Signed)
Patient ID: Charles Holmes, male   DOB: 1965-06-16, 57 y.o.   MRN: [id number]     Advanced Heart Failure Rounding Note  PCP-Cardiologist: Norman Herrlich, MD  [facility]: Dr. Gala Romney   Subjective:    7/25: Admit with cardiogenic shock. Started milrinone and NE. 8/6 S/P 13 teeth extractions  8/9 IABP placed 8/13 S/p HM III LVAD implant + clipping left atrial appendage d/t severe thickening and invagination of mitral valve annulus impeding flows 8/14 Left-sided hemiplegia. CT head with acute R MCA infarct. IR for thrombectomy. CT head with small to moderate area of hemorrhagic conversion 8/15 extubated  8/17 Low dose heparin gtt restarted, CT head with decreased size of hemorrhagic stroke.  8/18 Neurology signed off  Post Op Day #7  Remains on 0.375 milrinone + 5 Epi. iNO off.  On Sildenafil 40 tid.  Lasix 80 mg IV bid yesterday, I/Os net negative 2L. Weight down 6lbs. VAD MAP 70s-80s.   Swann removed 8/19. Now with PICC. Co-ox 63%  On empiric vancomycin/cefepime. tMax 100 last night. WBC 13.8>16.1>23  Limited echo 8/19: Mod RV dysfunction, better mitral inflow into the LV and VAD position satisfactory.  Feels fine this morning just really irritated about cortrak in place.   LVAD Interrogation HM III: Speed: 5300 Flow: 4.5 PI: 3.1 Power: 3.9. No PI events today   Objective:   Weight Range: 83.8 kg Body mass index is 28.94 kg/m.   Vital Signs:   Temp:  [97.7 F (36.5 C)-100 F (37.8 C)] 98.4 F (36.9 C) (08/20 0745) Pulse Rate:  [104-251] 118 (08/20 0730) Resp:  [16-25] 22 (08/20 0730) BP: (77-146)/(52-110) 93/80 (08/20 0700) SpO2:  [73 %-98 %] 92 % (08/20 0730) Weight:  [83.8 kg] 83.8 kg (08/20 0500) Last BM Date : 09/04/22  Weight change: Filed Weights   09/03/22 0337 09/04/22 0500 09/05/22 0500  Weight: 85.6 kg 86.3 kg 83.8 kg    Intake/Output:   Intake/Output Summary (Last 24 hours) at 09/05/2022 0828 Last data filed at 09/05/2022 0700 Gross per 24 hour  Intake  2129.7 ml  Output 3708 ml  Net -1578.3 ml   Physical Exam  CVP 11/12 General:  Well appearing. No resp difficulty HEENT: Cortrak in place, introducer still in Neck: supple. JVP ~10. Carotids 2+ bilat; no bruits. No lymphadenopathy or thyromegaly appreciated. Cor: Mechanical heart sounds with LVAD hum present. Lungs: Clear, diminished bases Abdomen: soft, nontender, nondistended. No hepatosplenomegaly. No bruits or masses. Good bowel sounds. Driveline: C/D/I; securement device intact and driveline incorporated Extremities: no cyanosis, clubbing, rash, +2-3 BLE edema.  Neuro: alert & orientedx3, cranial nerves grossly intact. moves all 4 extremities w/o difficulty. Affect pleasant   Telemetry     ST with BiV pacing 110-120s, PVCs (personally reviewed)    Labs    CBC Recent Labs    09/04/22 0447 09/05/22 0348  WBC 16.1* 23.0*  HGB 9.9* 9.7*  HCT 31.3* 30.3*  MCV 97.8 98.7  PLT 360 395    Basic Metabolic Panel Recent Labs    78/29/56 0447 09/05/22 0348  NA 134* 132*  K 4.1 3.6  CL 96* 93*  CO2 28 30  GLUCOSE 134* 147*  BUN 22* 25*  CREATININE 0.68 0.73  CALCIUM 8.2* 8.0*  MG 1.8 1.9  PHOS 2.3* 2.9    BNP: BNP (last 3 results) Recent Labs    08/10/22 1142 08/30/22 0258 09/04/22 2144  BNP 1,905.0* 922.3* 1,050.6*     Imaging    ECHOCARDIOGRAM LIMITED  Result Date:  09/04/2022    ECHOCARDIOGRAM LIMITED REPORT   Patient Name:   Charles Holmes Date of Exam: 09/04/2022 Medical Rec #:  [id number]       Height:       67.0 in Accession #:    [id number]      Weight:       190.3 lb Date of Birth:  1965-04-15       BSA:          1.980 m Patient Age:    57 years        BP:           97/80 mmHg Patient Gender: M               HR:           104 bpm. Exam Location:  Inpatient Procedure: Limited Echo and Limited Color Doppler Indications:    CHF I50.9  History:        Patient has prior history of Echocardiogram examinations, most                 recent 08/24/2022. CHF, CAD,  Stroke and COPD, Arrythmias:Atrial                 Fibrillation and Tachycardia; Risk Factors:Hypertension and                 Dyslipidemia.  Sonographer:    Lucendia Herrlich Referring Phys: [id number] PETER VANTRIGT IMPRESSIONS  1. LVAD at LV apex. No subcostal images.  2. Left ventricular ejection fraction, by estimation, is <20%. The left ventricle has severely decreased function. The left ventricle demonstrates global hypokinesis. The left ventricular internal cavity size was severely dilated.  3. Right ventricular systolic function is severely reduced. The right ventricular size is normal.  4. Left atrial size was mildly dilated.  5. Right atrial size was mildly dilated.  6. The mitral valve is normal in structure. No evidence of mitral valve regurgitation. No evidence of mitral stenosis.  7. The aortic valve has an indeterminant number of cusps. Aortic valve regurgitation is not visualized. FINDINGS  Left Ventricle: Left ventricular ejection fraction, by estimation, is <20%. The left ventricle has severely decreased function. The left ventricle demonstrates global hypokinesis. The left ventricular internal cavity size was severely dilated. There is no left ventricular hypertrophy. Right Ventricle: The right ventricular size is normal. Right ventricular systolic function is severely reduced. Left Atrium: Left atrial size was mildly dilated. Right Atrium: Right atrial size was mildly dilated. Pericardium: There is no evidence of pericardial effusion. Mitral Valve: The mitral valve is normal in structure. No evidence of mitral valve stenosis. Tricuspid Valve: The tricuspid valve is normal in structure. Tricuspid valve regurgitation is mild . No evidence of tricuspid stenosis. Aortic Valve: The aortic valve has an indeterminant number of cusps. Aortic valve regurgitation is not visualized. Pulmonic Valve: The pulmonic valve was not well visualized. Aorta: The aortic root is normal in size and structure. Additional  Comments: LVAD at LV apex. No subcostal images. A venous catheter is visualized.  RIGHT VENTRICLE RV S prime:     7.77 cm/s TRICUSPID VALVE TR Peak grad:   40.7 mmHg TR Vmax:        319.00 cm/s Olga Millers MD Electronically signed by Olga Millers MD Signature Date/Time: 09/04/2022/12:45:59 PM    Final      Medications:     Scheduled Medications:  sodium chloride   Intravenous Once   aspirin EC  325 mg  Oral Daily   atorvastatin  80 mg Per Tube Daily   bisacodyl  10 mg Rectal Daily   Chlorhexidine Gluconate Cloth  6 each Topical Q0600   dorzolamide-timolol  1 drop Both Eyes BID   feeding supplement (PROSource TF20)  60 mL Per Tube Daily   furosemide  80 mg Intravenous BID   insulin aspart  0-24 Units Subcutaneous Q6H   latanoprost  1 drop Both Eyes QHS   mexiletine  250 mg Per Tube BID   mometasone-formoterol  2 puff Inhalation BID   multivitamin with minerals  1 tablet Per Tube Daily   mouth rinse  15 mL Mouth Rinse 4 times per day   pantoprazole (PROTONIX) IV  40 mg Intravenous QHS   polyethylene glycol  17 g Per Tube Daily   potassium chloride  20 mEq Per Tube Q4H   sildenafil  40 mg Per Tube TID   sodium chloride flush  10-40 mL Intracatheter Q12H   sodium chloride flush  3 mL Intravenous Q12H   sodium chloride flush  3 mL Intravenous Q12H   umeclidinium bromide  1 puff Inhalation Daily   Warfarin - Physician Dosing Inpatient   Does not apply q1600    Infusions:  sodium chloride Stopped (08/30/22 0516)   sodium chloride Stopped (09/01/22 2027)   sodium chloride     sodium chloride Stopped (09/01/22 2043)   sodium chloride     sodium chloride     ceFEPime (MAXIPIME) IV Stopped (09/05/22 0341)   epinephrine 5 mcg/min (09/05/22 0700)   feeding supplement (VITAL 1.5 CAL) 60 mL/hr at 09/05/22 0700   heparin 500 Units/hr (09/05/22 0700)   lactated ringers     lactated ringers     lactated ringers Stopped (09/04/22 1658)   milrinone 0.375 mcg/kg/min (09/05/22 0700)    vancomycin Stopped (09/05/22 0020)    PRN Medications: sodium chloride, sodium chloride, acetaminophen, albuterol, dextrose, hydrALAZINE, lactated ringers, midazolam, morphine injection, morphine injection, ondansetron (ZOFRAN) IV, mouth rinse, oxyCODONE, sodium chloride flush, sodium chloride flush, sodium chloride flush, traMADol   Patient Profile  Mr Ackers is a 57 y.o. 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 Admitted with cardiogenic shock, stabilized and underwent HM3 LVAD. Post implant course c/b acute CVA.   Assessment/Plan  1.  Acute on chronic Systolic HF-->Cardiogenic Shock  - Diagnosed 11/2019. Presented with VT. LHC 70% LAD  - cMRI 12/21 concerning for sarcoid and EF 18%.  - [facility] 2/[facility] at [facility] EF 25% + active sarcoid - Echo 08/26/20 EF < 20% severely dilated LV RV mildly decreased.  - [employer] CRT-D upgrade in 06/08/21 - Echo 07/10/22: EF <20%, RV okay, mod pericardial effusion, mod MR/TR - Admitted 07/25 with cardiogenic shock. - RHC: Nonobstructive CAD, severely elevated filling pressures and low Fick CO/CI (2.7/1.4) - 08/13 HM III LVAD implant + clipping LAA d/t severe thickening and invagination of mitral valve annulus impeding flows.  - Apical core sent to pathology to confirm diagnosis of cardiac sarcoid >> pending - Speed increased to 5300 on 08/14. On milrinone 0.375 + Epi 5, LVAD flow 4.5. LDH stable - Wean epi as tolerated. Continue milrinone as he continues to diurese.  - We had difficulty weaning off iNO => now stopped but PASP in 50s by Swan. PA pressure responds to sildenafil, now at 40 mg tid.  - CVP 11/12, good UOP yesterday with 80 IV lasix BID. Would continue through today -  Place UNNA boots - CT head 09/02/22 w/ improved hemorrhagic CVA, now back on low dose heparin per neurology.   - Warfarin restarted yesterday, dosing per PharmD  2.  Acute stroke - Hx CVA 06/24 -Admitted 06/24 w/ R MCA  stroke. S/p TPA and mechanical clot extraction. No residual deficits. Likely cardioembolic in setting of severe LV dysfunction. - Developed left sided weakness 08/14. CTA with R MCA infarct. Taken to IR for thrombectomy - Repeat CT head with small to moderate size hemorrhagic conversion. CT head this am stable. - D/w neuro, repeat head CT on 8/17 w/ improved hemorrhagic CVA, now back on low dose heparin per neurology.  - Back on warfarin - neurology signed off 09/03/22.  - C/w PT/OT.  - Continue ASA 325  - Has Cor-Trak and still getting tube feeds, intake improving. On dysphagia 1 diet.  Will consult dietitian today to evaluate caloric intake. Patient wants cortrak out but not too much PO intake per RN report.  - will need CIR once medically stable for d/c (team following)   3. 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 Continue mexiletine  - now s/p ICD.   4. CAD - LHC 12/07/19 70-% LAD, no intervention - LHC 8/24 non obstructive CAD.  - Continue statin. On aspirin for VAD.  5. Cardiac sarcoid - PET 2/22 at [facility] EF 25% + active sarcoid - Has completed prednisone.  - holding methotrexate w/ recent surgery  - Sent apical core to pathology to confirm diagnosis of cardiac sarcoid  6. 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 AKI - suspect cardiorenal, improved w/ inotropic support - Creatinine 0.73 today.  - follow BMP w/ diuresis    8. Iron deficiency anemia/ Post-op anemia - recent T sat 15%, scheduled for OP feraheme. Will complete inpatient  - Transfused 1 u RBCs 8/15 - Hgb 9.7 today.     9. Pulmonary  - PFTs with severe obstructive defect, response to bronchodilator. FEV1 1.04L, FEV1/FVC 48% - extubated 8/15  - Cultures NGTD.   10. Elevated LFTs -AST/ALT improved, T bili 4.6>>5.1>>4.2>>2.7>>1.8>1.9 -Follow  11. ID - WBC 126>13.8>16.1>23 - discussed with TCTS, plan to broaden abx with Merrem (was on empiric vanc and cefepime) -  Incentive spirometer given to patient - tMax 100 - timing of foley removal? Pulling introducer today. - Cultures with NGTD  Dietary consulted today to evaluate for timing of cortrak removal. Continue to mobilize and get up to chair. Plan for CIR once medically stable.   Length of Stay: 26  Alen Bleacher, NP  09/05/2022, 8:28 AM  VAD Team Pager [phone] (7am - 7am)   Advanced Heart Failure Team Pager [phone] (M-F; 7a - 5p)  Please contact [facility] for night-coverage after hours (5p -7a ) and weekends on [email or web address]

## 2022-09-05 NOTE — Progress Notes (Signed)
Nutrition Follow-up  DOCUMENTATION CODES:   Not applicable  INTERVENTION:   CALORIE COUNT  Add Ensure Enlive po TID, each supplement provides 350 kcal and 20 grams of protein.  Pt receiving Magic Cup automatically while on Dyspagia 1/Nectar Thick diet, , each supplement provides 290 kcal and 9 grams of protein  Tube Feeding via Cortrak: plan to continue 24 hour full feedings at present as pt eating minimally and TF does not appear to be affecting appetite Vital 1.5 at 50 ml/hr Pro-Source TF20 60 mL BID TF provides 1960 kcals, 121 g of protein and  912 mL of free water   Add Banatrol TF BID via tube-each packet provides 5g of soluble fiber  Discussed bowel regimen with HF team; plan to d/c dulcolax suppository, continue to monitor  NUTRITION DIAGNOSIS:   Inadequate oral intake related to catabolic illness, poor appetite as evidenced by per patient/family report.  Being addressed via TF, supplements  GOAL:   Patient will meet greater than or equal to 90% of their needs  Progressing  MONITOR:   PO intake, Supplement acceptance, Labs, Weight trends  REASON FOR ASSESSMENT:   Consult LVAD Eval  ASSESSMENT:   57 yo male admitted from cardiologist office with worsening fatigue, shortness of breath and poor appetite with acute on chronic HF with significant reduced LV dysfunction (EF 20%) and preserved RV function. Pt being evaluated for possible LVAD placement. PMH includes cardiac and pulmonary sarcoidosis, CAD, GERD, HLD, HTN. Noted recent hospitalization from 6/24-6/30 this year with acute emoblic CVA.  7/25 Admitted 7/26 LVAD work-up initiated 8/06 Dental extraction (13 teeth) 8/09 IABP placed 8/13 HM3 LVAD implanted, IABP removed 8/14 Early AM-Code Stroke, CT with acute R MCA stroke with R MCA M1 occlusion requiring thrombectomy 8/15 Extubated 8/17 Repeat CT head with improved hemorrhagic CVA  Epinephrine at 5, currently on room air  Pt not happy about Cortrak,  wanting removed per RN/NP. On RD visit, pt does not complain about the Cortrak, does state that he is anxious. RN reports pt has been having hallucinations of and on, periods of confusion but is self-aware. Pt complaining of pain on his buttocks from sitting up in chair.   Pt has been tolerating Vital 1.5 at 60 ml/hr Pt reports he feels "gassy" but no N/V/abd pain. +multiple type 6/7 stools per RN. Pt currently with scheduled miralax, dulcolax suppository  Labs: sodium 132 (L), CBGs 138-180, BUN 25, Creatinine wdl, INR 1.1 Meds: lasix, ss novolog  Diet Order:   Diet Order             DIET - DYS 1 Room service appropriate? Yes with Assist; Fluid consistency: Nectar Thick  Diet effective now                   EDUCATION NEEDS:   Not appropriate for education at this time  Skin:  Skin Assessment: Skin Integrity Issues: Skin Integrity Issues:: Incisions Incisions: new LVAD-driveline  Last BM:  8/19 multiple loose stoolds, +flatus  Height:   Ht Readings from Last 1 Encounters:  08/31/22 5\' 7"  (1.702 m)    Weight:   Wt Readings from Last 1 Encounters:  09/05/22 83.8 kg     BMI:  Body mass index is 28.94 kg/m.  Estimated Nutritional Needs:   Kcal:  2000-2300 kcals  Protein:  110-125 g  Fluid:  1.8 L    Romelle Starcher MS, RDN, LDN, CNSC Registered Dietitian 3 Clinical Nutrition RD Pager and On-Call Pager Number Located in  Amion

## 2022-09-05 NOTE — Progress Notes (Addendum)
Pharmacy Antibiotic Note  Charles Holmes is a 57 y.o. male admitted on 08/10/2022 now s/p LVAD placement 8/13 with fever and leukocytosis. Pharmacy has been consulted for vancomycin and cefepime dosing.   Vancomycin doses adjusted last night due to levels.  Pharmacy asked by TCTS to switch cefepime to meropenem today for rising WBC.  Plan: Continue vanc to 750mg  IV Q12H Change to meropenem 2g q 8 hrs. Monitor renal fxn, clinical progress, weekly vanc levels  Height: 5\' 7"  (170.2 cm) Weight: 83.8 kg (184 lb 11.9 oz) IBW/kg (Calculated) : 66.1  Temp (24hrs), Avg:99.4 F (37.4 C), Min:97.7 F (36.5 C), Max:99.9 F (37.7 C)  Recent Labs  Lab 09/01/22 0500 09/02/22 0527 09/03/22 0333 09/04/22 0447 09/04/22 1233 09/04/22 2144 09/05/22 0348  WBC 19.6* 12.6* 13.8* 16.1*  --   --  23.0*  CREATININE 0.74 0.91 0.85 0.68  --   --  0.73  VANCOTROUGH  --   --   --   --   --  15  --   VANCOPEAK  --   --   --   --  34  --   --     Estimated Creatinine Clearance: 102.9 mL/min (by C-G formula based on SCr of 0.73 mg/dL).    Allergies  Allergen Reactions   Amiodarone     Severe tremors   Percocet [Oxycodone-Acetaminophen] Itching   Restart vanc 8/16> Cefepime 8/16>8/20 Meropenem 8/20 >   8/16 Bcx > NGTD 8/16 sputum > NGTD 8/16 UCx > neg  Charles Holmes, Charles Holmes, [facility] Clinical Pharmacist  09/05/2022 11:01 AM   [facility] pharmacy phone numbers are listed on [email or web address]

## 2022-09-05 NOTE — Progress Notes (Signed)
Patient getting confused and hallucinating per RN report. A&Ox3 on assessment but not feeling too well today and aware of increased anxiety today. Patient not sleeping well at night > ICU delirium. Not getting pain meds too often. Discussed with PharmD, will add Seroquel at night time. Add delirium precautions.   Now on Epi at 6, milrinone remains at 0.375, VAD MAP 70's at noon. Plan to wean epi as tolerated.   Brynda Peon, AGACNP-BC  Advanced Heart Failure Team

## 2022-09-05 NOTE — Progress Notes (Signed)
Physical Therapy Treatment Patient Details Name: Charles Holmes MRN: [id number] DOB: 1965-07-17 Today's Date: 09/05/2022   History of Present Illness Pt is 57 year old presented to [facility] on  08/10/22 for acute on chronic systolic heart failure. Pt had IABP placed on 08/25/22 for ongoing cardiogenic Holmes. Underwent placement of LVAD on 8/13. Developed lt sided weakness on 8/14. Rt MCA CVA. Underwent mechanical thrombectomy by IR. Extubated 8/15.  PMH - CAD, CHF, HLD, HTN, systolic HF, ICD implant, rt CVA.  LVAD placement and new stroke R MCA M1.    PT Comments  Pt progressing well. Pt able to complete 2 stands with modAx2 and transfer to chair this date. Pt also was able to tolerate standing x2 minutes with min/modAx2 for hygiene. As expected pt with decreased activity tolerance however continues to improve daily. Pt continues with L sides weakness, UE>LE, however improved L LE active movement noted today compared to yesterday and no knee buckling in standing this date. Current d/c recommendations remain appropriate. Acute PT to cont to follow.    If plan is discharge home, recommend the following:     Can travel by private vehicle        Equipment Recommendations  Other (comment) (To be determined)    Recommendations for Other Services       Precautions / Restrictions Precautions Precautions: Sternal;Fall;Other (comment) Precaution Comments: LVAD Restrictions Weight Bearing Restrictions: Yes (sternal precautions) RUE Weight Bearing: Non weight bearing RLE Weight Bearing: Non weight bearing Other Position/Activity Restrictions: Sternal Precautions     Mobility  Bed Mobility Overal bed mobility: Needs Assistance Bed Mobility: Supine to Sit     Supine to sit: +2 for physical assistance, Max assist, HOB elevated     General bed mobility comments: HOB elevated, used bed pad beneath patient to help guide pt to EOB, maxA for trunk elevation to inhibit pt from using UEs. Pt with  improved ability to move L LE this date however L UE remains to require significant help    Transfers Overall transfer level: Needs assistance Equipment used: 2 person hand held assist (face to face transfer with L knee blocked) Transfers: Sit to/from Stand, Bed to chair/wheelchair/BSC Sit to Stand: +2 physical assistance, Mod assist, +2 safety/equipment (RN for line management, PT and tech for transfer)   Step pivot transfers: Max assist, +2 physical assistance (RN managed lines)       General transfer comment: completed 2 sit to stands from EOB, pt stood x 2 minutes for hygiene, pt with noted L quad contraction requiring less blocking of L knee, pt quickly fatigued with increased trunk flexion, assist for weight shifting to advance feet for transfer to chair    Ambulation/Gait                   Stairs             Wheelchair Mobility     Tilt Bed    Modified Rankin (Stroke Patients Only)       Balance Overall balance assessment: Needs assistance Sitting-balance support: Feet supported, Single extremity supported Sitting balance-Leahy Scale: Fair Sitting balance - Comments: pt able to hold onto rail with R UE and maintain midline and upright sitting posture, without R UE support pt with L lateral lean     Standing balance-Leahy Scale: Poor Standing balance comment: reliant on external support  Cognition Arousal: Alert Behavior During Therapy: Impulsive, Restless Overall Cognitive Status: Impaired/Different from baseline Area of Impairment: Problem solving, Awareness, Following commands, Safety/judgement, Memory                     Memory: Decreased recall of precautions (requires constant verbal/tactile cues to adhere to sternal precautions functionally) Following Commands: Follows one step commands consistently, Follows one step commands with increased time Safety/Judgement: Decreased awareness of safety,  Decreased awareness of deficits (pt requesting a coca cola with a straw however pt not allowed, spoke to SLP who will talk to him) Awareness: Emergent Problem Solving: Slow processing, Requires verbal cues, Difficulty sequencing General Comments: decr awareness of deficits, oriented to date and follows commands apporpriately, slightly impulsive        Exercises Other Exercises Other Exercises: Repeated sit to stand x 10    General Comments General comments (skin integrity, edema, etc.): VSS on RA,L UE with swelling      Pertinent Vitals/Pain Pain Assessment Pain Assessment: No/denies pain    Home Living                          Prior Function            PT Goals (current goals can now be found in the care plan section) Acute Rehab PT Goals PT Goal Formulation: With patient Time For Goal Achievement: 09/14/22 Potential to Achieve Goals: Fair Progress towards PT goals: Progressing toward goals    Frequency    Min 1X/week      PT Plan Current plan remains appropriate    Co-evaluation              AM-PAC PT "6 Clicks" Mobility   Outcome Measure  Help needed turning from your back to your side while in a flat bed without using bedrails?: Total Help needed moving from lying on your back to sitting on the side of a flat bed without using bedrails?: Total Help needed moving to and from a bed to a chair (including a wheelchair)?: Total Help needed standing up from a chair using your arms (e.g., wheelchair or bedside chair)?: Total Help needed to walk in hospital room?: Total Help needed climbing 3-5 steps with a railing? : Total 6 Click Score: 6    End of Session   Activity Tolerance: Patient tolerated treatment well (li) Patient left: with call bell/phone within reach;with nursing/sitter in room;in chair;with chair alarm set Nurse Communication: Mobility status (use stedy to transfer pt back to bed as he will have to go to his weaker side the L due  to lines) PT Visit Diagnosis: Other abnormalities of gait and mobility (R26.89);Difficulty in walking, not elsewhere classified (R26.2);Hemiplegia and hemiparesis Hemiplegia - Right/Left: Left Hemiplegia - dominant/non-dominant: Non-dominant Hemiplegia - caused by: Cerebral infarction     Time: 1030-1100 PT Time Calculation (min) (ACUTE ONLY): 30 min  Charges:    $Therapeutic Activity: 23-37 mins PT General Charges $$ ACUTE PT VISIT: 1 Visit                     Charles Holmes, PT, DPT Acute Rehabilitation Services Secure chat preferred Office #: [phone]    [name] 09/05/2022, 12:26 PM

## 2022-09-05 NOTE — Progress Notes (Signed)
Inpatient Rehab Admissions Coordinator:    Per Dr. Gala Romney, d/c plan will be CIR but he is not yet ready at this point s/p LVAD placement. CIR will continue to follow and pursue consult once medically ready.   Megan Salon, MS, CCC-SLP Rehab Admissions Coordinator  [phone] (celll) [phone] (office)

## 2022-09-05 NOTE — Progress Notes (Signed)
LVAD Coordinator Rounding Note:  Admitted 08/10/22 due to acute on chronic CHF with cardiogenic shock. Milrinone dependent. Advance therapy workup completed, and pt deemed acceptable VAD candidate. Dental extractions completed 8/6. IABP placed 08/25/22.  HM 3 LVAD implanted on 08/29/22 by Dr Donata Clay under destination therapy criteria. 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 Result pending.  7/25 Admit with cardiogenic shock. Started milrinone and NE. 8/6 S/P 13 teeth extractions  8/9 IABP placed 8/13 S/p HM III LVAD implant + clipping left atrial appendage d/t severe thickening and invagination of mitral valve annulus impeding flows  CT Head 8/14 (initial) 1. 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 ASPECTS is 7. 2. No acute hemorrhage.  CT Angio Head/Neck 1. 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 2. Core infarct of 12 cc (somewhat underestimated compared to aspects) with 90 cc of penumbra. 3. Mild atherosclerosis.  Pt taken emergently to IR for percutaneous right common carotid arteriogram with thrombectomy. Revascularization achieved. Angio-seal closure device applied to left groin- clean, dry, and intact.   CT Head 8/15 @ 0450 Unchanged extent of infarct and hemorrhage in the right MCA distribution including small volume intraventricular clot. No hydrocephalus.  CT Head 8/17 @ 0701 Interval evolution of the right MCA territory infarct with decreased intraparenchymal and intraventricular hemorrhage. No hydrocephalus or midline shift.   DYS I diet. Pt is alert and oriented this morning. Frustrated about Cortrak. States he cannot breath with it in place and his mouth is dry. VAD Coordinator assisted pt with moisten swabs and educated pt on importance of Cortrak at this time for his recovery.   Currently on Cefepime 2 g q 8 hrs and Vancomycin 1000 mg q 12 hrs.   Vital signs: Temp: 98.4 HR: 108 Doppler Pressure:  92 Automatic BP: 101/68 (80) O2 Sat: 93% on RA Wt: 183.6>191.1>190.9>190.2>184.8lbs   LVAD interrogation reveals:  Speed: 5300 Flow: 4.1 Power: 3.9 w PI: 5.2  Alarms: none Events: none  Hematocrit: 20-DO NOT ADJUST Fixed speed: 5300 Low speed limit: 5000  Drive Line: Existing VAD dressing CDI. Anchor secure. Dressing to be changed every other day dressing changes.Next dressing change due 09/06/22 by VAD coordinator or nurse champion only.   Labs:  LDH trend: 596>441>165>384>345  INR trend: 1.4>1.6>1.1>1.1  AST/ALT trend: 218/45>96/30>73/23>63/36>58/37  Total Bili trend: 4.4>4.6>5.1>1.8>1.9  WBC trend: 11.6>13.7>19.6>16.1>23  Anticoagulation Plan: -INR Goal: 2.0 - 2.5 -ASA Dose: 325 mg  -Heparin 500 u/hr  Blood Products:  IntraOp 8/14: - 4 FFP - 2 Platelets - 2 PRBC - 1 cyro - 449 cc of cellsaver - DDAVP 20 mcg x 1   Device: Medtronic BiV -  -Therapies: OFF  Arrythmias:   Respiratory: 10 L high flow  Infection:  09/01/22>> sputum cx>> pending 09/01/22>> blood cx>> pending 09/01/22>> urine cx>> pending  Renal:  -BUN/CRT: 11/0.84  Adverse Events on VAD: - 08/30/22: - Developed left sided weakness this am. CTA with R MCA infarct. Taken to IR for thrombectomy   Drips:  Epinephrine 4 mcg/min Milrinone 0.375 mcg/kg/min  Patient Education: Discussed and demonstrated power source change with pt. Left extremity too weak at this time to perform power change on his own.  No family at bedside.   Plan/Recommendations:  Please page VAD coordinator for any alarms or VAD equipment issues. Every other day dressing changes by VAD coordinator or Nurse Rebecca Eaton RN,BSN VAD Coordinator  Office: [phone]  24/7 Pager: [phone]

## 2022-09-06 ENCOUNTER — Inpatient Hospital Stay (HOSPITAL_COMMUNITY): Payer: Medicare Other

## 2022-09-06 ENCOUNTER — Other Ambulatory Visit (HOSPITAL_COMMUNITY): Payer: Self-pay | Admitting: Internal Medicine

## 2022-09-06 DIAGNOSIS — I5023 Acute on chronic systolic (congestive) heart failure: Secondary | ICD-10-CM | POA: Diagnosis not present

## 2022-09-06 DIAGNOSIS — I428 Other cardiomyopathies: Secondary | ICD-10-CM | POA: Diagnosis not present

## 2022-09-06 LAB — LACTATE DEHYDROGENASE: LDH: 312 U/L — ABNORMAL HIGH (ref 98–192)

## 2022-09-06 LAB — CBC
HCT: 27.3 % — ABNORMAL LOW (ref 39.0–52.0)
Hemoglobin: 8.6 g/dL — ABNORMAL LOW (ref 13.0–17.0)
MCH: 31.2 pg (ref 26.0–34.0)
MCHC: 31.5 g/dL (ref 30.0–36.0)
MCV: 98.9 fL (ref 80.0–100.0)
Platelets: 389 10*3/uL (ref 150–400)
RBC: 2.76 MIL/uL — ABNORMAL LOW (ref 4.22–5.81)
RDW: 17.6 % — ABNORMAL HIGH (ref 11.5–15.5)
WBC: 26.6 10*3/uL — ABNORMAL HIGH (ref 4.0–10.5)
nRBC: 0.2 % (ref 0.0–0.2)

## 2022-09-06 LAB — CULTURE, BLOOD (ROUTINE X 2)
Culture: NO GROWTH
Culture: NO GROWTH
Special Requests: ADEQUATE

## 2022-09-06 LAB — COMPREHENSIVE METABOLIC PANEL
ALT: 50 U/L — ABNORMAL HIGH (ref 0–44)
AST: 69 U/L — ABNORMAL HIGH (ref 15–41)
Albumin: 1.7 g/dL — ABNORMAL LOW (ref 3.5–5.0)
Alkaline Phosphatase: 127 U/L — ABNORMAL HIGH (ref 38–126)
Anion gap: 9 (ref 5–15)
BUN: 27 mg/dL — ABNORMAL HIGH (ref 6–20)
CO2: 30 mmol/L (ref 22–32)
Calcium: 7.8 mg/dL — ABNORMAL LOW (ref 8.9–10.3)
Chloride: 95 mmol/L — ABNORMAL LOW (ref 98–111)
Creatinine, Ser: 1.03 mg/dL (ref 0.61–1.24)
GFR, Estimated: >60 mL/min (ref >60)
Glucose, Bld: 186 mg/dL — ABNORMAL HIGH (ref 70–99)
Potassium: 4 mmol/L (ref 3.5–5.1)
Sodium: 134 mmol/L — ABNORMAL LOW (ref 135–145)
Total Bilirubin: 1.2 mg/dL (ref 0.3–1.2)
Total Protein: 4.9 g/dL — ABNORMAL LOW (ref 6.5–8.1)

## 2022-09-06 LAB — GLUCOSE, CAPILLARY
Glucose-Capillary: 192 mg/dL — ABNORMAL HIGH (ref 70–99)
Glucose-Capillary: 151 mg/dL — ABNORMAL HIGH (ref 70–99)
Glucose-Capillary: 169 mg/dL — ABNORMAL HIGH (ref 70–99)
Glucose-Capillary: 119 mg/dL — ABNORMAL HIGH (ref 70–99)
Glucose-Capillary: 128 mg/dL — ABNORMAL HIGH (ref 70–99)
Glucose-Capillary: 132 mg/dL — ABNORMAL HIGH (ref 70–99)
Glucose-Capillary: 164 mg/dL — ABNORMAL HIGH (ref 70–99)

## 2022-09-06 LAB — HEPARIN LEVEL (UNFRACTIONATED)
Heparin Unfractionated: 0.67 [IU]/mL (ref 0.30–0.70)
Heparin Unfractionated: 0.1 [IU]/mL — ABNORMAL LOW (ref 0.30–0.70)

## 2022-09-06 LAB — APTT
aPTT: 166 s (ref 24–36)
aPTT: 51 seconds — ABNORMAL HIGH (ref 24–36)

## 2022-09-06 LAB — COOXEMETRY PANEL
Carboxyhemoglobin: 2.7 % — ABNORMAL HIGH (ref 0.5–1.5)
Methemoglobin: <0.7 % (ref 0.0–1.5)
O2 Saturation: 68.3 %
Total hemoglobin: 9.1 g/dL — ABNORMAL LOW (ref 12.0–16.0)

## 2022-09-06 LAB — CG4 I-STAT (LACTIC ACID)
Lactic Acid, Venous: 0.9 mmol/L (ref 0.5–1.9)
Lactic Acid, Venous: 0.8 mmol/L (ref 0.5–1.9)

## 2022-09-06 LAB — PROTIME-INR
INR: 1.3 — ABNORMAL HIGH (ref 0.8–1.2)
Prothrombin Time: 16 s — ABNORMAL HIGH (ref 11.4–15.2)

## 2022-09-06 MED ORDER — OXYMETAZOLINE HCL 0.05 % NA SOLN
1.0000 | Freq: Two times a day (BID) | NASAL | Status: DC
  Administered 2022-09-07: 1 via NASAL
  Filled 2022-09-06: qty 30

## 2022-09-06 MED ORDER — WARFARIN SODIUM 2 MG PO TABS
2.0000 mg | ORAL_TABLET | Freq: Every day | ORAL | Status: DC
  Administered 2022-09-06 – 2022-09-07 (×2): 2 mg via ORAL
  Filled 2022-09-06 (×2): qty 1

## 2022-09-06 MED ORDER — POTASSIUM CHLORIDE 20 MEQ PO PACK
20.0000 meq | PACK | Freq: Two times a day (BID) | ORAL | Status: DC
  Administered 2022-09-06 – 2022-09-07 (×2): 20 meq
  Filled 2022-09-06 (×2): qty 1

## 2022-09-06 MED ORDER — ACETAMINOPHEN 160 MG/5ML PO SOLN
650.0000 mg | ORAL | Status: DC | PRN
  Administered 2022-09-06 – 2022-09-07 (×2): 650 mg
  Filled 2022-09-06 (×2): qty 20.3

## 2022-09-06 MED ORDER — POTASSIUM CHLORIDE 20 MEQ PO PACK: 20.0000 meq | PACK | Freq: Two times a day (BID) | ORAL | Status: DC

## 2022-09-06 MED ORDER — SALINE SPRAY 0.65 % NA SOLN: 2.0000 | NASAL | Status: DC | PRN

## 2022-09-06 MED ORDER — VITAL 1.5 CAL PO LIQD
780.0000 mL | ORAL | Status: DC
  Administered 2022-09-06 – 2022-09-16 (×11): 780 mL
  Filled 2022-09-06: qty 1000
  Filled 2022-09-06 (×4): qty 948
  Filled 2022-09-06 (×3): qty 1000
  Filled 2022-09-06: qty 948
  Filled 2022-09-06 (×3): qty 1000
  Filled 2022-09-06: qty 948

## 2022-09-06 NOTE — Progress Notes (Signed)
Occupational Therapy Treatment Patient Details Name: Charles Holmes MRN: [id number] DOB: 1965-01-30 Today's Date: 09/06/2022   History of present illness Pt is 57 year old presented to [facility] on  08/10/22 for acute on chronic systolic heart failure. Pt had IABP placed on 08/25/22 for ongoing cardiogenic shock. Underwent placement of LVAD on 8/13. Developed lt sided weakness on 8/14. Rt MCA CVA. Underwent mechanical thrombectomy by IR. Extubated 8/15.  PMH - CAD, CHF, HLD, HTN, systolic HF, ICD implant, rt CVA.  LVAD placement and new stroke R MCA M1.   OT comments  Pt is making fair progress towards their acute OT goals. He remains limited by impaired cognition with poor insight, limited recall or precautions and limited awareness. Maximal cues and mod assist needed for bed mobility and mod A +2 needed for simple transfers. Once in chair, AAROM of LUE completed with retrograde massage for edema management with notable improvement. Pt left with L hand significantly elevated, and pt was able to recall appropriate position for LUE in the chair and in the bed for carryover. OT to continue to follow acutely to facilitate progress towards established goals. Pt will continue to benefit from intensive inpatient follow up therapy, >3 hours/day after discharge.        If plan is discharge home, recommend the following:  Two people to help with walking and/or transfers;Help with stairs or ramp for entrance;Assistance with feeding;Assist for transportation;Assistance with cooking/housework;A lot of help with bathing/dressing/bathroom   Equipment Recommendations  Other (comment)       Precautions / Restrictions Precautions Precautions: Sternal;Fall;Other (comment) Precaution Comments: LVAD Restrictions Weight Bearing Restrictions: Yes RUE Weight Bearing: Non weight bearing RLE Weight Bearing: Non weight bearing Other Position/Activity Restrictions: Sternal Precautions       Mobility Bed  Mobility Overal bed mobility: Needs Assistance Bed Mobility: Sidelying to Sit, Rolling Rolling: Min assist Sidelying to sit: Mod assist       General bed mobility comments: cues needed for sternal precautions    Transfers Overall transfer level: Needs assistance Equipment used: 2 person hand held assist Transfers: Sit to/from Stand, Bed to chair/wheelchair/BSC Sit to Stand: Mod assist     Step pivot transfers: Mod assist, +2 safety/equipment     General transfer comment: stood for ~20 seconds during peri care     Balance Overall balance assessment: Needs assistance Sitting-balance support: Feet supported, Single extremity supported Sitting balance-Leahy Scale: Fair       Standing balance-Leahy Scale: Poor                             ADL either performed or assessed with clinical judgement   ADL Overall ADL's : Needs assistance/impaired                         Toilet Transfer: Moderate assistance;Stand-pivot Toilet Transfer Details (indicate cue type and reason): simulated from bed>chair Toileting- Clothing Manipulation and Hygiene: Total assistance;Sit to/from stand;+2 for safety/equipment Toileting - Clothing Manipulation Details (indicate cue type and reason): total A in standing for rear peri care     Functional mobility during ADLs: Moderate assistance General ADL Comments: very limited standing tolerance    Extremity/Trunk Assessment Upper Extremity Assessment Upper Extremity Assessment: RUE deficits/detail;LUE deficits/detail RUE Deficits / Details: Globally RUE Sensation: WNL RUE Coordination: WNL LUE Deficits / Details: edematous, focused on retrograde massage and AAROM for edema management. globally he is ~3-/5 MMT. Sensation is  WFL. unable to obtain full active gross grasp flexion. LUE Sensation: WNL LUE Coordination: decreased fine motor;decreased gross motor   Lower Extremity Assessment Lower Extremity Assessment: Defer to PT  evaluation        Vision   Vision Assessment?: No apparent visual deficits   Perception Perception Perception: Within Functional Limits   Praxis Praxis Praxis: WFL    Cognition Arousal: Alert Behavior During Therapy: Restless Overall Cognitive Status: Impaired/Different from baseline Area of Impairment: Problem solving, Awareness, Following commands, Safety/judgement, Memory                     Memory: Decreased recall of precautions Following Commands: Follows one step commands consistently, Follows one step commands with increased time Safety/Judgement: Decreased awareness of safety, Decreased awareness of deficits Awareness: Emergent Problem Solving: Slow processing, Requires verbal cues, Difficulty sequencing General Comments: requires maximal cues for precautions, poor delayed recall. very limited insight to deficits and safety.        Exercises Exercises: Other exercises Other Exercises Other Exercises: L hand retrograde massage with lotion and elevation Other Exercises: AAROM of L hand, wrist, elbow and shoulder for LUE edema management    Shoulder Instructions       General Comments VSS on RA, RN present and assisting    Pertinent Vitals/ Pain       Pain Assessment Pain Assessment: No/denies pain Faces Pain Scale: Hurts a little bit Pain Location: checst Pain Descriptors / Indicators: Sore, Guarding, Grimacing Pain Intervention(s): Monitored during session   Frequency  Min 1X/week        Progress Toward Goals  OT Goals(current goals can now be found in the care plan section)  Progress towards OT goals: Progressing toward goals  Acute Rehab OT Goals Patient Stated Goal: none stated OT Goal Formulation: With patient Time For Goal Achievement: 09/14/22 Potential to Achieve Goals: Fair ADL Goals Pt Will Perform Grooming: with contact guard assist;sitting Pt Will Perform Upper Body Bathing: with min assist;sitting Pt Will Perform Upper  Body Dressing: with mod assist;sitting Pt Will Transfer to Toilet: with mod assist;squat pivot transfer;bedside commode Additional ADL Goal #1: Tolerate sitting EOB up to 5 min with supervision to increase independince with toileting.      AM-PAC OT "6 Clicks" Daily Activity     Outcome Measure   Help from another person eating meals?: A Little Help from another person taking care of personal grooming?: A Lot Help from another person toileting, which includes using toliet, bedpan, or urinal?: Total Help from another person bathing (including washing, rinsing, drying)?: A Lot Help from another person to put on and taking off regular upper body clothing?: A Lot Help from another person to put on and taking off regular lower body clothing?: Total 6 Click Score: 11    End of Session Equipment Utilized During Treatment: Gait belt  OT Visit Diagnosis: Unsteadiness on feet (R26.81);Other abnormalities of gait and mobility (R26.89);Muscle weakness (generalized) (M62.81)   Activity Tolerance Patient tolerated treatment well   Patient Left in chair;with call bell/phone within reach;with nursing/sitter in room   Nurse Communication Mobility status        Time: 4098-1191 OT Time Calculation (min): 18 min  Charges: OT General Charges $OT Visit: 1 Visit OT Treatments $Therapeutic Activity: 8-22 mins  Derenda Mis, OTR/L Acute Rehabilitation Services Office [phone] Secure Chat Communication Preferred   Donia Pounds 09/06/2022, 5:04 PM

## 2022-09-06 NOTE — Progress Notes (Signed)
OT Cancellation Note  Patient Details Name: JERSON KITTLE MRN: [id number] DOB: [birth year]   Cancelled Treatment:    Reason Eval/Treat Not Completed: Fatigue/lethargy limiting ability to participate (Pt back to bed after PT session due to fatigue. OT to return for treatment as pt is able to tolerate.)  Donia Pounds 09/06/2022, 12:27 PM

## 2022-09-06 NOTE — Progress Notes (Signed)
Physical Therapy Treatment Patient Details Name: Charles Holmes MRN: [id number] DOB: Dec 01, 1965 Today's Date: 09/06/2022   History of Present Illness Pt is 57 year old presented to [facility] on  08/10/22 for acute on chronic systolic heart failure. Pt had IABP placed on 08/25/22 for ongoing cardiogenic shock. Underwent placement of LVAD on 8/13. Developed lt sided weakness on 8/14. Rt MCA CVA. Underwent mechanical thrombectomy by IR. Extubated 8/15.  PMH - CAD, CHF, HLD, HTN, systolic HF, ICD implant, rt CVA.  LVAD placement and new stroke R MCA M1.    PT Comments  Pt received sitting up in chair with request to return to bed due to fatigue. PT focused on LE seated exercises with emphasis on L LE then RN and PT assisted pt back to bed via step pvt to the L which required maxAX2. Pt slowly progressing towards all goals, pt with improved L LE strength more than L UE strength in which L UE remains very swollen. Spoke with OT regarding a lymphedema/compression sleeve per request from Dr. Gala Romney. Acute PT to cont to follow. Current d/c recommendations remain appropriate.    If plan is discharge home, recommend the following:     Can travel by private vehicle        Equipment Recommendations  Other (comment) (To be determined)    Recommendations for Other Services       Precautions / Restrictions Precautions Precautions: Sternal;Fall;Other (comment) Precaution Comments: LVAD Restrictions Weight Bearing Restrictions: Yes (sternal) RUE Weight Bearing: Non weight bearing RLE Weight Bearing: Non weight bearing Other Position/Activity Restrictions: Sternal Precautions     Mobility  Bed Mobility Overal bed mobility: Needs Assistance Bed Mobility: Sit to Supine       Sit to supine: +2 for physical assistance, Max assist   General bed mobility comments: assist to safely lower trunk and then to bring LEs up while adhereing to sternal precautions    Transfers Overall transfer level: Needs  assistance Equipment used: 2 person hand held assist (face to face transfer with L knee blocked) Transfers: Sit to/from Stand, Bed to chair/wheelchair/BSC Sit to Stand: +2 physical assistance, Mod assist, +2 safety/equipment (RN for line management, PT and tech for transfer)   Step pivot transfers: Max assist, +2 physical assistance (RN managed lines)       General transfer comment: completed 2 sit to stands from chair, pt stood x 2 minutes for hygiene, pt with noted L quad contraction requiring less blocking of L knee, pt quickly fatigued with increased trunk flexion, assist for weight shifting to advance feet for transfer to chair, pt dependent to advance L LE due to weakness    Ambulation/Gait                   Stairs             Wheelchair Mobility     Tilt Bed    Modified Rankin (Stroke Patients Only)       Balance Overall balance assessment: Needs assistance Sitting-balance support: Feet supported, Single extremity supported Sitting balance-Leahy Scale: Fair       Standing balance-Leahy Scale: Poor Standing balance comment: reliant on external support                            Cognition Arousal: Alert Behavior During Therapy: Restless Overall Cognitive Status: Impaired/Different from baseline Area of Impairment: Problem solving, Awareness, Following commands, Safety/judgement, Memory  Memory: Decreased recall of precautions (requires constant verbal/tactile cues to adhere to sternal precautions functionally) Following Commands: Follows one step commands consistently, Follows one step commands with increased time Safety/Judgement: Decreased awareness of safety, Decreased awareness of deficits Awareness: Emergent Problem Solving: Slow processing, Requires verbal cues, Difficulty sequencing General Comments: decr awareness of deficits, oriented to date and follows commands apporpriately, slightly impulsive         Exercises General Exercises - Lower Extremity Long Arc Quad: AROM, Left, 10 reps, Seated (with 3 sec hold at full extension) Heel Slides: AAROM, Left, Seated, 10 reps (against manual resistance with L foot on washcloth on floor) Hip ABduction/ADduction: Left, 10 reps, Seated, AROM, Strengthening (squeezed pillow) Heel Raises: AROM, Both, 10 reps, Seated (against manual pressure)    General Comments General comments (skin integrity, edema, etc.): VSS on RA, L UE swelling, spoke with OT regarding compression sleep      Pertinent Vitals/Pain Pain Assessment Pain Assessment: No/denies pain    Home Living                          Prior Function            PT Goals (current goals can now be found in the care plan section) Acute Rehab PT Goals PT Goal Formulation: With patient Time For Goal Achievement: 09/14/22 Potential to Achieve Goals: Fair Progress towards PT goals: Progressing toward goals    Frequency    Min 1X/week      PT Plan Current plan remains appropriate    Co-evaluation              AM-PAC PT "6 Clicks" Mobility   Outcome Measure  Help needed turning from your back to your side while in a flat bed without using bedrails?: Total Help needed moving from lying on your back to sitting on the side of a flat bed without using bedrails?: Total Help needed moving to and from a bed to a chair (including a wheelchair)?: Total Help needed standing up from a chair using your arms (e.g., wheelchair or bedside chair)?: Total Help needed to walk in hospital room?: Total Help needed climbing 3-5 steps with a railing? : Total 6 Click Score: 6    End of Session   Activity Tolerance: Patient limited by fatigue (li) Patient left: with call bell/phone within reach;with nursing/sitter in room;in bed   PT Visit Diagnosis: Other abnormalities of gait and mobility (R26.89);Difficulty in walking, not elsewhere classified (R26.2);Hemiplegia and  hemiparesis Hemiplegia - Right/Left: Left Hemiplegia - dominant/non-dominant: Non-dominant Hemiplegia - caused by: Cerebral infarction     Time: 1005-1035 PT Time Calculation (min) (ACUTE ONLY): 30 min  Charges:    $Therapeutic Exercise: 8-22 mins $Therapeutic Activity: 8-22 mins PT General Charges $$ ACUTE PT VISIT: 1 Visit                     [name], PT, DPT Acute Rehabilitation Services Secure chat preferred Office #: [phone]    [name] 09/06/2022, 2:58 PM

## 2022-09-06 NOTE — Progress Notes (Signed)
This chaplain is present for F/U spiritual care. The Pt. is sleeping at time of visit. Revisit is planned.  Chaplain Stephanie Acre [phone]

## 2022-09-06 NOTE — Progress Notes (Signed)
Patient ID: Charles Holmes, male   DOB: 10-29-1965, 57 y.o.   MRN: [id number]     Advanced Heart Failure Rounding Note  PCP-Cardiologist: Charles Herrlich, MD  [facility]: Dr. Gala Holmes   Subjective:    7/25: Admit with cardiogenic shock. Started milrinone and NE. 8/6 S/P 13 teeth extractions  8/9 IABP placed 8/13 S/p HM III LVAD implant + clipping left atrial appendage d/t severe thickening and invagination of mitral valve annulus impeding flows 8/14 Left-sided hemiplegia. CT head with acute R MCA infarct. IR for thrombectomy. CT head with small to moderate area of hemorrhagic conversion 8/15 extubated  8/17 Low dose heparin gtt restarted, CT head with decreased size of hemorrhagic stroke.  8/18 Neurology signed off 8/19: Limited echo: Mod RV dysfunction, better mitral inflow into the LV and VAD position satisfactory.  Post Op Day #8  Remains on 0.375 milrinone + 6 Epi.   Co-ox 68%. CVP 11   On empiric vancomycin/mero. WBC 13.8>16.1>23>26K. Cultures no growth  Hasn't slept well the past few nights. Getting some better rest this morning. Comfortable. No dyspnea or current complaints.   LVAD Interrogation HM III: Speed: 5350 Flow: 4.7 PI: 3.1 Power: 3.8. No PI events today   Objective:   Weight Range: 81.1 kg Body mass index is 28 kg/m.   Vital Signs:   Temp:  [98.1 F (36.7 C)-100.3 F (37.9 C)] 98.1 F (36.7 C) (08/21 0743) Pulse Rate:  [101-227] 137 (08/21 0700) Resp:  [13-26] 20 (08/21 0700) BP: (59-140)/(42-120) 84/75 (08/21 0700) SpO2:  [76 %-100 %] 93 % (08/21 0700) Weight:  [81.1 kg] 81.1 kg (08/21 0500) Last BM Date : 09/05/22  Weight change: Filed Weights   09/04/22 0500 09/05/22 0500 09/06/22 0500  Weight: 86.3 kg 83.8 kg 81.1 kg    Intake/Output:   Intake/Output Summary (Last 24 hours) at 09/06/2022 0752 Last data filed at 09/06/2022 0700 Gross per 24 hour  Intake 3469.82 ml  Output 4478 ml  Net -1008.18 ml   Physical Exam   CVP 11  General:  OOB,  sleeping in chair. Resting comfortably. No resp difficulty HEENT: Cortrak in place, introducer still in Neck: supple. JVP ~10. Carotids 2+ bilat; no bruits. No lymphadenopathy or thyromegaly appreciated. Cor: Mechanical heart sounds with LVAD hum present. Lungs: clear, decreased BS at the bases  Abdomen: soft, nontender, nondistended. No hepatosplenomegaly. No bruits or masses. Good bowel sounds. Driveline: C/D/I; securement device intact and driveline incorporated Extremities: no cyanosis, clubbing, rash, 1+ b/l LEE  Neuro: alert & orientedx3, cranial nerves grossly intact. moves all 4 extremities w/o difficulty. Affect pleasant   Telemetry     ST with BiV pacing 110s (personally reviewed)    Labs    CBC Recent Labs    09/05/22 2217 09/06/22 0359  WBC 29.0* 26.6*  HGB 9.1* 8.6*  HCT 28.7* 27.3*  MCV 96.6 98.9  PLT 400 389    Basic Metabolic Panel Recent Labs    34/91/79 0447 09/05/22 0348 09/05/22 2217 09/06/22 0359  NA 134* 132* 134* 134*  K 4.1 3.6 3.4* 4.0  CL 96* 93* 93* 95*  CO2 28 30 31 30   GLUCOSE 134* 147* 190* 186*  BUN 22* 25* 24* 27*  CREATININE 0.68 0.73 0.72 1.03  CALCIUM 8.2* 8.0* 7.7* 7.8*  MG 1.8 1.9  --   --   PHOS 2.3* 2.9  --   --     BNP: BNP (last 3 results) Recent Labs    08/10/22 1142 08/30/22 0258  09/04/22 2144  BNP 1,905.0* 922.3* 1,050.6*     Imaging    No results found.   Medications:     Scheduled Medications:  sodium chloride   Intravenous Once   aspirin  324 mg Per Tube Daily   atorvastatin  80 mg Per Tube Daily   Chlorhexidine Gluconate Cloth  6 each Topical Q0600   dorzolamide-timolol  1 drop Both Eyes BID   feeding supplement  237 mL Oral TID BM   feeding supplement (PROSource TF20)  60 mL Per Tube BID   fiber supplement (BANATROL TF)  60 mL Per Tube BID   furosemide  80 mg Intravenous BID   insulin aspart  0-24 Units Subcutaneous Q6H   latanoprost  1 drop Both Eyes QHS   melatonin  3 mg Oral QHS    mexiletine  250 mg Per Tube BID   mometasone-formoterol  2 puff Inhalation BID   multivitamin with minerals  1 tablet Per Tube Daily   mouth rinse  15 mL Mouth Rinse 4 times per day   pantoprazole (PROTONIX) IV  40 mg Intravenous QHS   polyethylene glycol  17 g Per Tube Daily   potassium chloride  20 mEq Per Tube Q4H   QUEtiapine  25 mg Oral QHS   sildenafil  20 mg Per Tube TID   sodium chloride flush  3 mL Intravenous Q12H   umeclidinium bromide  1 puff Inhalation Daily   Warfarin - Pharmacist Dosing Inpatient   Does not apply q1600    Infusions:  sodium chloride Stopped (09/01/22 2027)   epinephrine 6 mcg/min (09/06/22 0700)   feeding supplement (VITAL 1.5 CAL) 50 mL/hr at 09/06/22 0700   heparin 500 Units/hr (09/06/22 0700)   meropenem (MERREM) IV 280 mL/hr at 09/06/22 0700   milrinone 0.375 mcg/kg/min (09/06/22 0700)   vancomycin Stopped (09/06/22 0102)    PRN Medications: sodium chloride, acetaminophen, albuterol, dextrose, hydrALAZINE, midazolam, morphine injection, morphine injection, ondansetron (ZOFRAN) IV, mouth rinse, oxyCODONE, sodium chloride flush, traMADol   Patient Profile  Charles Holmes is a 56 y.o. 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 Admitted with cardiogenic shock, stabilized and underwent HM3 LVAD. Post implant course c/b acute CVA.   Assessment/Plan  1.  Acute on chronic Systolic HF-->Cardiogenic Shock  - Diagnosed 11/2019. Presented with VT. LHC 70% LAD  - cMRI 12/21 concerning for sarcoid and EF 18%.  - PET 2/22 at [facility] EF 25% + active sarcoid - Echo 08/26/20 EF < 20% severely dilated LV RV mildly decreased.  - Medtronic CRT-D upgrade in 06/08/21 - Echo 07/10/22: EF <20%, RV okay, mod pericardial effusion, mod Charles/TR - Admitted 07/25 with cardiogenic shock. - RHC: Nonobstructive CAD, severely elevated filling pressures and low Fick CO/CI (2.7/1.4) - 08/13 HM III LVAD implant + clipping  LAA d/t severe thickening and invagination of mitral valve annulus impeding flows.  - Apical core sent to pathology to confirm diagnosis of cardiac sarcoid >> pending - Speed increased to 5300 on 08/14. On milrinone 0.375 + Epi 5, LVAD flow 4.5. LDH stable - Wean epi as tolerated. Continue milrinone as he continues to diurese.  - continue sildenafil, now at 40 mg tid.  - CVP 11/12, good UOP yesterday with 80 IV lasix BID. Would continue through today - UNNA boots - CT head 09/02/22 w/ improved hemorrhagic CVA, now back on low dose heparin per neurology.   - Warfarin restarted. INR 1.3  dosing per PharmD  2.  Acute stroke - Hx CVA 06/24 -Admitted 06/24 w/ R MCA stroke. S/p TPA and mechanical clot extraction. No residual deficits. Likely cardioembolic in setting of severe LV dysfunction. - Developed left sided weakness 08/14. CTA with R MCA infarct. Taken to IR for thrombectomy - Repeat CT head with small to moderate size hemorrhagic conversion. CT head this am stable. - D/w neuro, repeat head CT on 8/17 w/ improved hemorrhagic CVA, now back on low dose heparin per neurology.  - Back on warfarin - neurology signed off 09/03/22.  - C/w PT/OT.  - Continue ASA 325  - Has Cor-Trak and still getting tube feeds, intake improving. On dysphagia 1 diet. Dietitian following   - will need CIR once medically stable for d/c (team following)   3. 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 Continue mexiletine  - now s/p ICD.   4. CAD - LHC 12/07/19 70-% LAD, no intervention - LHC 8/24 non obstructive CAD.  - Continue statin. On aspirin for VAD.  5. Cardiac sarcoid - PET 2/22 at [facility] EF 25% + active sarcoid - Has completed prednisone.  - holding methotrexate w/ recent surgery  - Sent apical core to pathology to confirm diagnosis of cardiac sarcoid  6. 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 AKI - suspect cardiorenal, improved w/ inotropic support - Creatinine  1.0 today.  - follow BMP w/ diuresis    8. Iron deficiency anemia/ Post-op anemia - recent T sat 15%, scheduled for OP feraheme. Will complete inpatient  - Transfused 1 u RBCs 8/15 - Hgb 8.6 today.     9. Pulmonary  - PFTs with severe obstructive defect, response to bronchodilator. FEV1 1.04L, FEV1/FVC 48% - extubated 8/15  - Cultures NGTD.   10. Elevated LFTs -AST/ALT improved, T bili 4.6>>5.1>>4.2>>2.7>>1.8>1.9>1.2 -Follow  11. ID - WBC 126>13.8>16.1>23>26K  - cont Merrem/vanc  - Cultures with NGTD - Incentive spirometer given to patient   Length of Stay: [address], PA-C  09/06/2022, 7:52 AM  VAD Team Pager [phone] (7am - 7am)   Advanced Heart Failure Team Pager [phone] (M-F; 7a - 5p)  Please contact [facility] for night-coverage after hours (5p -7a ) and weekends on [email or web address]

## 2022-09-06 NOTE — Progress Notes (Signed)
Calorie Count Note  48 hour calorie count ordered.  Diet: Dysphagia 1, Nectar Liquids on 8/20, advanced to Dysphagia 2 with Thin Liquids this AM Supplements: Ensure Enlive/Plus, Magic Cup  8/20 (Dysphagia 1, Nectar Thick) Lunch: 100% puree peaches, 2 bites of Magic Cup (100 kcals, 0g protein) Dinner:  0% of meal tray, [employer] Sundae Milkshake (brought in by family)-33% (280 kcals, 8 g of protein) Evening Snack:  100% Ensure (350 kcals, 20 g protein)  8/21 (adv to Dysphagia 2, Thins after Breakfast) Breakfast: 4 ounce Orange, Applesauce (120 kcals) AM Snack: 100% Ensure (350 kcals, 20 g of protein) Lunch: pending Dinner: pending  Not meeting calorie or protein needs via oral route. Pt really enjoys the Ensure supplements, encouraged pt to drink TID  Diet advanced from Puree to Dysphagia 2 today; Nectar Liquids to Thins. Hopefully diet advancement may improve appeal of eating meals. Plan trial of Nocturnal TF which may or may not improve appetite and intake.   Intervention:  1) Change to Nocturnal TF Vital 1.5 at 65 ml/hr x 12 hours Pro-Source TF20 60 mL BID TF provides 93 g of protein, 1330 kcals, 593 mL of free water 2) Continue Ensure Enlive po TID, each supplement provides 350 kcal and 20 grams of protein.   Romelle Starcher MS, RDN, LDN, CNSC Registered Dietitian 3 Clinical Nutrition RD Pager and On-Call Pager Number Located in [place]

## 2022-09-06 NOTE — Progress Notes (Signed)
LVAD Coordinator Rounding Note:  Admitted 08/10/22 due to acute on chronic CHF with cardiogenic shock. Milrinone dependent. Advance therapy workup completed, and pt deemed acceptable VAD candidate. Dental extractions completed 8/6. IABP placed 08/25/22.  HM 3 LVAD implanted on 08/29/22 by Dr Donata Clay under destination therapy criteria. 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 Result negative.  7/25 Admit with cardiogenic shock. Started milrinone and NE. 8/6 S/P 13 teeth extractions  8/9 IABP placed 8/13 S/p HM III LVAD implant + clipping left atrial appendage d/t severe thickening and invagination of mitral valve annulus impeding flows  CT Head 8/14 (initial) 1. 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 ASPECTS is 7. 2. No acute hemorrhage.  CT Angio Head/Neck 1. 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 2. Core infarct of 12 cc (somewhat underestimated compared to aspects) with 90 cc of penumbra. 3. Mild atherosclerosis.  Pt taken emergently to IR for percutaneous right common carotid arteriogram with thrombectomy. Revascularization achieved. Angio-seal closure device applied to left groin- clean, dry, and intact.   CT Head 8/15 @ 0450 Unchanged extent of infarct and hemorrhage in the right MCA distribution including small volume intraventricular clot. No hydrocephalus.  CT Head 8/17 @ 0701 Interval evolution of the right MCA territory infarct with decreased intraparenchymal and intraventricular hemorrhage. No hydrocephalus or midline shift.   Pt lying in bed on my arrival. States he has been up to chair this morning and worked with speech therapy this morning. Advanced to DYS 2 with no signs of aspiration with thin liquids.  Currently on Cefepime 2 g q 8 hrs and Vancomycin 1000 mg q 12 hrs.   Vital signs: Temp: 98.6 HR: 109 Doppler Pressure: 84 Automatic BP: 94/79 (86) O2 Sat: 95% on RA Wt:  183.6>191.1>190.9>190.2>184.8>178.8lbs   LVAD interrogation reveals:  Speed: 5300 Flow: 4.8 Power: 3.9 w PI: 3.2  Alarms: none Events: none  Hematocrit: 20-DO NOT ADJUST Fixed speed: 5300 Low speed limit: 5000  Drive Line: Existing VAD dressing removed and site care performed using sterile technique. Drive line exit site cleaned with Chlora prep applicators x 2, allowed to dry, and silver strip with gauze applied. Exit site healing and unincorporated, the velour is fully implanted at exit site.  No redness, drainage, tenderness, foul odor or rash noted. 2 suture in place. Drive line anchor secure. Pt has some skin breakdown from tape and 2 blisters...these areas were left OTA. Continue every other day dressing changes. Next dressing change due 09/08/22 by VAD coordinator or nurse champion only.   Labs:  LDH trend: 596>441>165>384>345>312  INR trend: 1.4>1.6>1.1>1.1>1.3  AST/ALT trend: 218/45>96/30>73/23>63/36>58/37>69/50  Total Bili trend: 4.4>4.6>5.1>1.8>1.9>11.2  WBC trend: 11.6>13.7>19.6>16.1>23>26.6  Anticoagulation Plan: -INR Goal: 2.0 - 2.5 -ASA Dose: 325 mg  -Heparin 500 u/hr  Blood Products:  IntraOp 8/14: - 4 FFP - 2 Platelets - 2 PRBC - 1 cyro - 449 cc of cellsaver - DDAVP 20 mcg x 1   Device: Medtronic BiV -  -Therapies: OFF  Arrythmias:   Respiratory: RA  Infection:  09/01/22>> sputum cx>> NGTD>>final 09/01/22>> blood cx>> NGTD>>final 09/01/22>> urine cx>> NGTD>> final  Renal:  -BUN/CRT: 11/0.84  Adverse Events on VAD: - 08/30/22: - Developed left sided weakness this am. CTA with R MCA infarct. Taken to IR for thrombectomy   Drips:  Epinephrine 6 mcg/min Milrinone 0.375 mcg/kg/min  Patient Education: Discussed and demonstrated power source change with pt. Left extremity too weak at this time to  perform power change on his own.  No family at bedside.   Plan/Recommendations:  Please page VAD coordinator for any alarms or VAD equipment  issues. Every other day dressing changes by VAD coordinator or Nurse Rebecca Eaton RN,BSN VAD Coordinator  Office: [phone]  24/7 Pager: [phone]

## 2022-09-06 NOTE — Progress Notes (Signed)
ANTICOAGULATION CONSULT NOTE  Pharmacy Consult for heparin + Coumadin Indication:  LVAD  Allergies  Allergen Reactions   Amiodarone     Severe tremors   Percocet [Oxycodone-Acetaminophen] Itching    Patient Measurements: Height: 5\' 7"  (170.2 cm) Weight: 81.1 kg (178 lb 12.7 oz) IBW/kg (Calculated) : 66.1 Heparin Dosing Weight: 87kg  Vital Signs: Temp: 98.1 F (36.7 C) (08/21 0743) Temp Source: Oral (08/21 0743) BP: 94/82 (08/21 0900) Pulse Rate: 109 (08/21 0900)  Labs: Recent Labs    09/04/22 0447 09/05/22 0348 09/05/22 2217 09/06/22 0359 09/06/22 0635  HGB 9.9* 9.7* 9.1* 8.6*  --   HCT 31.3* 30.3* 28.7* 27.3*  --   PLT 360 395 400 389  --   APTT 30 32  --  166* 51*  LABPROT 14.1 14.2  --  16.0*  --   INR 1.1 1.1  --  1.3*  --   HEPARINUNFRC <0.10* <0.10*  --  0.67 0.10*  CREATININE 0.68 0.73 0.72 1.03  --     Estimated Creatinine Clearance: 78.8 mL/min (by C-G formula based on SCr of 1.03 mg/dL).   Medical History: Past Medical History:  Diagnosis Date   CAD (coronary artery disease)    CHF (congestive heart failure) (HCC)    GERD (gastroesophageal reflux disease)    Hyperlipidemia    Hypertension    Systolic heart failure (HCC) 2021   LVEF 18%, RVEF 38% on cardiac MRI 12/19/2019. possible cardiac sarcoidosis.   Wide-complex tachycardia 2021   wears LifeVest     Assessment: 57yoM on apixaban PTA for hx AF admitted for LVAD workup. Pt s/p HM3 implant on 8/13 c/b acute CVA postop. Head CT 8/17 stable, ok to start low dose heparin per neuro.   Heparin level 0.1 as expected on heparin drip 500uts/hr - no titration, H/H and pltc stable. Coumadin initiated 8/19 INR 1.3 - increase slowly to goal   Goal of Therapy:  Heparin level <0.1 units/ml Monitor platelets by anticoagulation protocol: Yes   Plan:  Continue Heparin 500 units/h - no up titrations  Daily heparin level and CBC Coumadin 2mg  daily  Daily INR.   Leota Sauers Pharm.D. CPP,  BCPS Clinical Pharmacist [phone] 09/06/2022 10:18 AM   [facility] pharmacy phone numbers are listed on [email or web address]

## 2022-09-06 NOTE — Progress Notes (Signed)
Speech Language Pathology Treatment: Dysphagia  Patient Details Name: Charles Holmes MRN: [id number] DOB: 1965-12-23 Today's Date: 09/06/2022 Time: 0945-1000 SLP Time Calculation (min) (ACUTE ONLY): 15 min  Assessment / Plan / Recommendation Clinical Impression  Pt reports dislike of his cortrak, dislike of nectar thick liquids and desire for more foods. He is still contradictory about the solid texture he would prefer. Discussed with RN and will advance to dys 2 and monitor for tolerance. Pt sipping thin liquids today without signs of aspiration regardless of rate or use of straw. Pt will also resume thin liquids. Pts goal is for removal or cortrak since he feels very bothered by it and thinks he would eat better if it weren't there.   HPI HPI: Charles Holmes is 57 year old presented [facility] 08/10/22 for acute on chronic systolic heart failure. Multiple dental extractions 8/6. Pt had IABP placed on 08/25/22 for ongoing cardiogenic shock. Underwent placement of LVAD on 8/13. Developed lt sided weakness on 8/14. Rt MCA CVA. Underwent mechanical thrombectomy by IR. ETT 8/13-15.  PMH - CAD, CHF, HLD, HTN, systolic HF, ICD implant, rt CVA.  LVAD placement and new stroke R MCA M1.      SLP Plan  Continue with current plan of care      Recommendations for follow up therapy are one component of a multi-disciplinary discharge planning process, led by the attending physician.  Recommendations may be updated based on patient status, additional functional criteria and insurance authorization.    Recommendations  Diet recommendations: Thin liquid;Dysphagia 2 (fine chop) Liquids provided via: Straw;Cup Medication Administration: Whole meds with puree Supervision: Full supervision/cueing for compensatory strategies Compensations: Slow rate;Small sips/bites;Clear throat intermittently Postural Changes and/or Swallow Maneuvers: Seated upright 90 degrees                        Dysphagia, unspecified  (R13.10)     Continue with current plan of care     Gilma Bessette, Riley Nearing  09/06/2022, 10:11 AM

## 2022-09-06 NOTE — Progress Notes (Signed)
HeartMate 3 Rounding Note  POD #8 Implant HM-3  Subjective:   57 year old male with cardiac sarcoidosis, nonischemic cardiomyopathy, and advanced heart failure refractory to optimal medical therapy underwent HeartMate 3 implantation on August 13 and required preoperative support with intra-aortic balloon pump and inotropic therapy.  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  Intraoperatively the patient had moderate RV dysfunction and following VAD implantation there was some obstruction to the inflow through the mitral valve from posterior annular thickening/ hematoma which was not clearly defined as the inflow cannula was in good position and far removed from the mitral valve.  After application of a left atrial clip the inflow appeared to be improved.  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 Off anticoagulation now  due to mild hemorrhagic extension by f/u CT.  Patient was started on IV antibiotics postop day 6 for elevated white count and fever.  Cultures are negative.  Chest x-ray shows only mild atelectasis no significant effusion.  Sternal incision looks clearing and dry.  Patient's white count is started to improve on vancomycin and meropenem.  Foley catheter was removed.  All surgical lines have been removed.  The patient requires low-dose epinephrine and milrinone for RV dysfunction.  Currently diuresing well with twice daily IV Lasix.  Weight down 2 kg.  Chest x-ray with minimal atelectasis.  PharmD is slowly loading coumadin with low dose heparin bridge.  INR currently 1.3    LVAD INTERROGATION:  HeartMate II LVAD:  Flow 4.4  liters/min, speed 5300 rpm, power 3.6, PI  4.1.  Controller intact.   Objective:    Vital Signs:   Temp:  [98.1 F (36.7 C)-100.3 F (37.9 C)] 98.1 F (36.7 C) (08/21 0743) Pulse Rate:  [101-227] 109 (08/21 0900) Resp:  [13-26] 24 (08/21 0900) BP: (59-140)/(42-120) 94/82 (08/21 0900) SpO2:  [76 %-100 %] 94 % (08/21 0900) Weight:  [81.1 kg] 81.1 kg (08/21 0500) Last BM Date : 09/05/22 Mean arterial Pressure 88-92 mm Hg  Intake/Output:  Exam  General -alert moving L side better, speech better HEENT:-No bleeding from mouth at extraction sits Neck: supple. JVP . Carotids without bruits. No lymphadenopathy or thryomegaly appreciated. Cor: Mechanical heart sounds with LVAD hum present. Lungs: clear Abdomen: soft, nontender, nondistended. No hepatosplenomegaly. No bruits or masses. Good bowel sounds. Extremities: no cyanosis, clubbing, rash, edema Neuro: alert & orientedx3, cranial nerves grossly intact. moves all 4 extremities . Affect pleasant  Telemetry: paced with PPM 90/min  Labs: Basic Metabolic Panel: Recent Labs  Lab 09/02/22 0527 09/02/22 1633 09/03/22 0333 09/04/22 0447 09/05/22 0348 09/05/22 2217 09/06/22 0359  NA 134*  --  136 134* 132* 134* 134*  K 3.7  --  4.4 4.1 3.6 3.4* 4.0  CL 105  --  103 96* 93* 93* 95*  CO2 24  --  26 28 30 31 30   GLUCOSE 152*  --  183* 134* 147* 190* 186*  BUN 22*  --  25* 22* 25* 24* 27*  CREATININE 0.91  --  0.85 0.68 0.73 0.72 1.03  CALCIUM 8.2*  --  8.2* 8.2* 8.0* 7.7* 7.8*  MG 2.1 2.0 1.8 1.8 1.9  --   --   PHOS 2.4* 2.3* 1.7* 2.3* 2.9  --   --  Liver Function Tests: Recent Labs  Lab 09/02/22 0527 09/03/22 0333 09/04/22 0447 09/05/22 0348 09/06/22 0359  AST 65* 68* 63* 58* 69*  ALT 27 34 36 37 50*  ALKPHOS 97 147* 156* 131* 127*  BILITOT 4.2* 2.7* 1.8* 1.9* 1.2  PROT 5.0* 5.5* 5.3* 5.1* 4.9*  ALBUMIN 2.4* 2.3* 2.2* 1.9* 1.7*   No results for input(s): "LIPASE", "AMYLASE" in the last 168 hours. No results for input(s): "AMMONIA" in the last 168  hours.  CBC: Recent Labs  Lab 08/31/22 0310 08/31/22 0329 09/03/22 0333 09/04/22 0447 09/05/22 0348 09/05/22 2217 09/06/22 0359  WBC 13.7*   < > 13.8* 16.1* 23.0* 29.0* 26.6*  NEUTROABS 11.0*  --   --   --   --   --   --   HGB 7.9*   < > 9.4* 9.9* 9.7* 9.1* 8.6*  HCT 24.8*   < > 30.7* 31.3* 30.3* 28.7* 27.3*  MCV 100.0   < > 100.7* 97.8 98.7 96.6 98.9  PLT 139*   < > 260 360 395 400 389   < > = values in this interval not displayed.    INR: Recent Labs  Lab 09/02/22 0527 09/03/22 0333 09/04/22 0447 09/05/22 0348 09/06/22 0359  INR 1.3* 1.2 1.1 1.1 1.3*    Other results: EKG:   Imaging: DG Chest Port 1 View  Result Date: 09/05/2022 CLINICAL DATA:  History of LVAD EXAM: PORTABLE CHEST 1 VIEW COMPARISON:  Chest radiograph dated 09/04/2022 FINDINGS: Lines/tubes: Interval removal of PA catheter with right IJ sheath in-situ. Left chest wall ICD leads project over the right atrium and ventricle and tributary of the coronary sinus. LVAD in-situ. Enteric tube tip reaches the diaphragm and terminates below the field of view. Interval placement of right upper extremity PICC. Tip projects over the superior cavoatrial junction. Lungs: Bilateral interstitial opacities. Slightly increased dense retrocardiac opacities. Pleura: Possible small right pleural effusion. Left costophrenic angle is obscured. No pneumothorax. Heart/mediastinum: Similar enlarged postsurgical cardiomediastinal silhouette. Bones: Median sternotomy wires are nondisplaced. IMPRESSION: 1. Interval removal of PA catheter with right IJ sheath in-situ. 2. Interval placement of right upper extremity PICC with tip projecting over the superior cavoatrial junction. 3. Slightly increased dense retrocardiac opacities, likely atelectasis. 4. Possible small right pleural effusion. Electronically Signed   By: Agustin Cree M.D.   On: 09/05/2022 08:42   ECHOCARDIOGRAM LIMITED  Result Date: 09/04/2022    ECHOCARDIOGRAM LIMITED REPORT    Patient Name:   [name] Date of Exam: 09/04/2022 Medical Rec #:  [id number]       Height:       67.0 in Accession #:    [id number]      Weight:       190.3 lb Date of Birth:  1965-04-24       BSA:          1.980 m Patient Age:    57 years        BP:           97/80 mmHg Patient Gender: M               HR:           104 bpm. Exam Location:  Inpatient Procedure: Limited Echo and Limited Color Doppler Indications:    CHF I50.9  History:        Patient has prior history of Echocardiogram examinations, most  recent 08/24/2022. CHF, CAD, Stroke and COPD, Arrythmias:Atrial                 Fibrillation and Tachycardia; Risk Factors:Hypertension and                 Dyslipidemia.  Sonographer:    Lucendia Herrlich Referring Phys: [phone] Nalia Honeycutt IMPRESSIONS  1. LVAD at LV apex. No subcostal images.  2. Left ventricular ejection fraction, by estimation, is <20%. The left ventricle has severely decreased function. The left ventricle demonstrates global hypokinesis. The left ventricular internal cavity size was severely dilated.  3. Right ventricular systolic function is severely reduced. The right ventricular size is normal.  4. Left atrial size was mildly dilated.  5. Right atrial size was mildly dilated.  6. The mitral valve is normal in structure. No evidence of mitral valve regurgitation. No evidence of mitral stenosis.  7. The aortic valve has an indeterminant number of cusps. Aortic valve regurgitation is not visualized. FINDINGS  Left Ventricle: Left ventricular ejection fraction, by estimation, is <20%. The left ventricle has severely decreased function. The left ventricle demonstrates global hypokinesis. The left ventricular internal cavity size was severely dilated. There is no left ventricular hypertrophy. Right Ventricle: The right ventricular size is normal. Right ventricular systolic function is severely reduced. Left Atrium: Left atrial size was mildly dilated. Right Atrium: Right atrial  size was mildly dilated. Pericardium: There is no evidence of pericardial effusion. Mitral Valve: The mitral valve is normal in structure. No evidence of mitral valve stenosis. Tricuspid Valve: The tricuspid valve is normal in structure. Tricuspid valve regurgitation is mild . No evidence of tricuspid stenosis. Aortic Valve: The aortic valve has an indeterminant number of cusps. Aortic valve regurgitation is not visualized. Pulmonic Valve: The pulmonic valve was not well visualized. Aorta: The aortic root is normal in size and structure. Additional Comments: LVAD at LV apex. No subcostal images. A venous catheter is visualized.  RIGHT VENTRICLE RV S prime:     7.77 cm/s TRICUSPID VALVE TR Peak grad:   40.7 mmHg TR Vmax:        319.00 cm/s Olga Millers MD Electronically signed by Olga Millers MD Signature Date/Time: 09/04/2022/12:45:59 PM    Final      Medications:     Scheduled Medications:  sodium chloride   Intravenous Once   aspirin  324 mg Per Tube Daily   atorvastatin  80 mg Per Tube Daily   Chlorhexidine Gluconate Cloth  6 each Topical Q0600   dorzolamide-timolol  1 drop Both Eyes BID   feeding supplement  237 mL Oral TID BM   feeding supplement (PROSource TF20)  60 mL Per Tube BID   fiber supplement (BANATROL TF)  60 mL Per Tube BID   furosemide  80 mg Intravenous BID   insulin aspart  0-24 Units Subcutaneous Q6H   latanoprost  1 drop Both Eyes QHS   melatonin  3 mg Oral QHS   mexiletine  250 mg Per Tube BID   mometasone-formoterol  2 puff Inhalation BID   multivitamin with minerals  1 tablet Per Tube Daily   mouth rinse  15 mL Mouth Rinse 4 times per day   pantoprazole (PROTONIX) IV  40 mg Intravenous QHS   polyethylene glycol  17 g Per Tube Daily   QUEtiapine  25 mg Oral QHS   sildenafil  20 mg Per Tube TID   sodium chloride flush  3 mL Intravenous Q12H   umeclidinium bromide  1 puff Inhalation Daily   Warfarin - Pharmacist Dosing Inpatient   Does not apply q1600     Infusions:  sodium chloride Stopped (09/01/22 2027)   epinephrine 6 mcg/min (09/06/22 0900)   feeding supplement (VITAL 1.5 CAL) 50 mL/hr at 09/06/22 0900   heparin 500 Units/hr (09/06/22 0900)   meropenem (MERREM) IV Stopped (09/06/22 2841)   milrinone 0.375 mcg/kg/min (09/06/22 0900)   vancomycin Stopped (09/06/22 0102)    PRN Medications: sodium chloride, acetaminophen, albuterol, dextrose, hydrALAZINE, midazolam, ondansetron (ZOFRAN) IV, mouth rinse, oxyCODONE, sodium chloride flush, traMADol   Assessment:  57 year old patient with sarcoidosis, COPD history of stroke  and nonischemic cardiomyopathy with preoperative cardiogenic shock requiring balloon pump and inotropic therapy.  Postoperative  right middle cerebral artery CVA treated with neuro-- radiology invention,   Postop RV dysfunction req inhaled NO and inotropes with incresad bilirubin 1.2>> 4.9 Now off inhaled NO and bilirubin improving Patient now diuresing well, will continue low-dose epinephrine Plan/Discussion:   RV dysfunction status post VAD implant-continue low-dose epinephrine, wean as tolerated.  Postoperative right middle cerebral CVA.  Initial hemorrhagic extension was mild and has started to improve. Low dose heparin started.  Coumadin dosing has started by PharmD  Swallow function improved by FEES- on sips Check calorie count and transition from tube feeds to oral intake when safe.  Continue IV vancomycin meropenem for fever and elevated white count.  I reviewed the LVAD parameters from today, and compared the results to the patient's prior recorded data.  No programming changes were made.  The LVAD is functioning within specified parameters.  The patient performs LVAD self-test daily.  LVAD interrogation was negative for any significant power changes, alarms or PI events/speed drops.  LVAD equipment check completed and is in good working order.  Back-up equipment present.   LVAD education done on  emergency procedures and precautions and reviewed exit site care.  Length of Stay: [address]  Lovett Sox 09/06/2022, 9:34 AM

## 2022-09-07 ENCOUNTER — Inpatient Hospital Stay (HOSPITAL_COMMUNITY): Payer: Medicare Other

## 2022-09-07 ENCOUNTER — Telehealth (HOSPITAL_COMMUNITY): Payer: Self-pay | Admitting: *Deleted

## 2022-09-07 DIAGNOSIS — I428 Other cardiomyopathies: Secondary | ICD-10-CM

## 2022-09-07 DIAGNOSIS — I5023 Acute on chronic systolic (congestive) heart failure: Secondary | ICD-10-CM | POA: Diagnosis not present

## 2022-09-07 LAB — COMPREHENSIVE METABOLIC PANEL
ALT: 72 U/L — ABNORMAL HIGH (ref 0–44)
AST: 93 U/L — ABNORMAL HIGH (ref 15–41)
Albumin: 1.7 g/dL — ABNORMAL LOW (ref 3.5–5.0)
Alkaline Phosphatase: 159 U/L — ABNORMAL HIGH (ref 38–126)
Anion gap: 14 (ref 5–15)
BUN: 28 mg/dL — ABNORMAL HIGH (ref 6–20)
CO2: 27 mmol/L (ref 22–32)
Calcium: 7.9 mg/dL — ABNORMAL LOW (ref 8.9–10.3)
Chloride: 93 mmol/L — ABNORMAL LOW (ref 98–111)
Creatinine, Ser: 0.75 mg/dL (ref 0.61–1.24)
GFR, Estimated: >60 mL/min (ref >60)
Glucose, Bld: 213 mg/dL — ABNORMAL HIGH (ref 70–99)
Potassium: 3.8 mmol/L (ref 3.5–5.1)
Sodium: 134 mmol/L — ABNORMAL LOW (ref 135–145)
Total Bilirubin: 1 mg/dL (ref 0.3–1.2)
Total Protein: 5.2 g/dL — ABNORMAL LOW (ref 6.5–8.1)

## 2022-09-07 LAB — CBC
HCT: 27.9 % — ABNORMAL LOW (ref 39.0–52.0)
Hemoglobin: 8.6 g/dL — ABNORMAL LOW (ref 13.0–17.0)
MCH: 30.9 pg (ref 26.0–34.0)
MCHC: 30.8 g/dL (ref 30.0–36.0)
MCV: 100.4 fL — ABNORMAL HIGH (ref 80.0–100.0)
Platelets: 435 10*3/uL — ABNORMAL HIGH (ref 150–400)
RBC: 2.78 MIL/uL — ABNORMAL LOW (ref 4.22–5.81)
RDW: 17.6 % — ABNORMAL HIGH (ref 11.5–15.5)
WBC: 21 10*3/uL — ABNORMAL HIGH (ref 4.0–10.5)
nRBC: 0.2 % (ref 0.0–0.2)

## 2022-09-07 LAB — PROTIME-INR
INR: 1.2 (ref 0.8–1.2)
INR: 1.2 (ref 0.8–1.2)
Prothrombin Time: 15.7 s — ABNORMAL HIGH (ref 11.4–15.2)
Prothrombin Time: 15.1 seconds (ref 11.4–15.2)

## 2022-09-07 LAB — COOXEMETRY PANEL
Carboxyhemoglobin: 2.7 % — ABNORMAL HIGH (ref 0.5–1.5)
Methemoglobin: <0.7 % (ref 0.0–1.5)
O2 Saturation: 77.6 %
Total hemoglobin: 9.2 g/dL — ABNORMAL LOW (ref 12.0–16.0)

## 2022-09-07 LAB — HEPARIN LEVEL (UNFRACTIONATED)
Heparin Unfractionated: 0.47 [IU]/mL (ref 0.30–0.70)
Heparin Unfractionated: <0.1 [IU]/mL — ABNORMAL LOW (ref 0.30–0.70)

## 2022-09-07 LAB — LACTATE DEHYDROGENASE: LDH: 312 U/L — ABNORMAL HIGH (ref 98–192)

## 2022-09-07 LAB — GLUCOSE, CAPILLARY
Glucose-Capillary: 214 mg/dL — ABNORMAL HIGH (ref 70–99)
Glucose-Capillary: 151 mg/dL — ABNORMAL HIGH (ref 70–99)
Glucose-Capillary: 113 mg/dL — ABNORMAL HIGH (ref 70–99)
Glucose-Capillary: 113 mg/dL — ABNORMAL HIGH (ref 70–99)
Glucose-Capillary: 129 mg/dL — ABNORMAL HIGH (ref 70–99)
Glucose-Capillary: 157 mg/dL — ABNORMAL HIGH (ref 70–99)
Glucose-Capillary: 175 mg/dL — ABNORMAL HIGH (ref 70–99)

## 2022-09-07 LAB — CG4 I-STAT (LACTIC ACID): Lactic Acid, Venous: 1.5 mmol/L (ref 0.5–1.9)

## 2022-09-07 LAB — APTT
aPTT: 88 s — ABNORMAL HIGH (ref 24–36)
aPTT: 33 seconds (ref 24–36)

## 2022-09-07 LAB — MAGNESIUM: Magnesium: 1.6 mg/dL — ABNORMAL LOW (ref 1.7–2.4)

## 2022-09-07 MED ORDER — TORSEMIDE 20 MG PO TABS: 40.0000 mg | ORAL_TABLET | Freq: Every day | ORAL | Status: DC

## 2022-09-07 MED ORDER — OXYCODONE HCL 5 MG PO TABS
5.0000 mg | ORAL_TABLET | ORAL | Status: DC | PRN
  Administered 2022-09-07: 5 mg via ORAL
  Filled 2022-09-07: qty 1

## 2022-09-07 MED ORDER — POTASSIUM CHLORIDE 20 MEQ PO PACK
20.0000 meq | PACK | ORAL | Status: DC
  Administered 2022-09-07 (×2): 20 meq
  Filled 2022-09-07 (×2): qty 1

## 2022-09-07 MED ORDER — MAGNESIUM SULFATE 4 GM/100ML IV SOLN
4.0000 g | Freq: Once | INTRAVENOUS | Status: AC
  Administered 2022-09-07: 4 g via INTRAVENOUS
  Filled 2022-09-07: qty 100

## 2022-09-07 MED ORDER — POTASSIUM CHLORIDE 20 MEQ PO PACK
40.0000 meq | PACK | Freq: Once | ORAL | Status: AC
  Administered 2022-09-07: 40 meq
  Filled 2022-09-07: qty 2

## 2022-09-07 MED ORDER — INSULIN ASPART 100 UNIT/ML IJ SOLN
0.0000 [IU] | INTRAMUSCULAR | Status: DC
  Administered 2022-09-07: 2 [IU] via SUBCUTANEOUS
  Administered 2022-09-07: 4 [IU] via SUBCUTANEOUS
  Administered 2022-09-07: 2 [IU] via SUBCUTANEOUS
  Administered 2022-09-08: 4 [IU] via SUBCUTANEOUS
  Administered 2022-09-08: 2 [IU] via SUBCUTANEOUS

## 2022-09-07 MED ORDER — FUROSEMIDE 10 MG/ML IJ SOLN
80.0000 mg | Freq: Once | INTRAMUSCULAR | Status: AC
  Administered 2022-09-07: 80 mg via INTRAVENOUS
  Filled 2022-09-07: qty 8

## 2022-09-07 MED ORDER — FUROSEMIDE 10 MG/ML IJ SOLN
40.0000 mg | Freq: Once | INTRAMUSCULAR | Status: AC
  Administered 2022-09-07: 40 mg via INTRAVENOUS
  Filled 2022-09-07: qty 4

## 2022-09-07 NOTE — Progress Notes (Signed)
LVAD Coordinator Rounding Note:  Admitted 08/10/22 due to acute on chronic CHF with cardiogenic shock. Milrinone dependent. Advance therapy workup completed, and pt deemed acceptable VAD candidate. Dental extractions completed 8/6. IABP placed 08/25/22.  HM 3 LVAD implanted on 08/29/22 by Dr Donata Clay under destination therapy criteria. 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 Result negative.  7/25 Admit with cardiogenic shock. Started milrinone and NE. 8/6 S/P 13 teeth extractions  8/9 IABP placed 8/13 S/p HM III LVAD implant + clipping left atrial appendage d/t severe thickening and invagination of mitral valve annulus impeding flows  CT Head 8/14 (initial) 1. 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 ASPECTS is 7. 2. No acute hemorrhage.  CT Angio Head/Neck 1. 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 2. Core infarct of 12 cc (somewhat underestimated compared to aspects) with 90 cc of penumbra. 3. Mild atherosclerosis.  Pt taken emergently to IR for percutaneous right common carotid arteriogram with thrombectomy. Revascularization achieved. Angio-seal closure device applied to left groin- clean, dry, and intact.   CT Head 8/15 @ 0450 Unchanged extent of infarct and hemorrhage in the right MCA distribution including small volume intraventricular clot. No hydrocephalus.  CT Head 8/17 @ 0701 Interval evolution of the right MCA territory infarct with decreased intraparenchymal and intraventricular hemorrhage. No hydrocephalus or midline shift.   Pt lying in bed on my arrival. States he did not sleep well overnight. Nursing staff reported confusion and hallucinations at night earlier in the week. Seroquel and Melatonin added. ICU delirium precautions advised to orient day/night schedule. Pain control adequate. Pt has become hypotensive in the afternoon the past 2 days. Sildenafil decreased to 20mg  TID 8/20 and afternoon Lasix  decreased to 40mg  yesterday afternoon.   Pt progressing with PT/OT/SLP. CIR consulted once medically stable.   Vital signs: Temp: 97.7 HR: 109 Doppler Pressure: 84 Automatic BP: 104/88 (93) O2 Sat: 95% on RA Wt: 183.6>191.1>190.9>190.2>184.8>178.8lbs   LVAD interrogation reveals:  Speed: 5300 Flow: 4.2 Power: 3.8 w PI: 3.9  Alarms: none Events: none  Hematocrit: 27 Fixed speed: 5300 Low speed limit: 5000  Drive Line: Existing VAD dressing CDI. Anchor secure. Continue every other day dressing changes. Next dressing change due 09/08/22 by VAD coordinator or nurse champion only.   Labs:  LDH trend: 596>441>165>384>345>312>312  INR trend: 1.4>1.6>1.1>1.1>1.3>1.2  AST/ALT trend: 218/45>96/30>73/23>63/36>58/37>69/50>93/72  Total Bili trend: 4.4>4.6>5.1>1.8>1.9>1.2>1.0  WBC trend: 11.6>13.7>19.6>16.1>23>26.6>21  Anticoagulation Plan: -INR Goal: 2.0 - 2.5 -ASA Dose: 325 mg  -Heparin 500 u/hr - Coumadin dosing per pharmacy  Blood Products:  IntraOp 8/14: - 4 FFP - 2 Platelets - 2 PRBC - 1 cyro - 449 cc of cellsaver - DDAVP 20 mcg x 1   Device: [employer] BiV -  -Therapies: OFF  Arrythmias:   Respiratory: RA  Infection:  09/01/22>> sputum cx>> NGTD>>final 09/01/22>> blood cx>> NGTD>>final 09/01/22>> urine cx>> NGTD>> final  Renal:  8/22: BUN/CRT: 28/0.75  Adverse Events on VAD: - 08/30/22: - Developed left sided weakness this am. CTA with R MCA infarct. Taken to IR for thrombectomy   Drips:  Epinephrine 9 mcg/min Milrinone 0.375 mcg/kg/min  Patient Education: Discussed and demonstrated power source change with pt. Left extremity too weak at this time to perform power change on his own.  No family at bedside.   Plan/Recommendations:  Please page VAD coordinator for any alarms or VAD equipment issues. Every other day dressing changes by VAD coordinator or Nurse Jorge Mandril  [facility] RN,BSN VAD Coordinator  Office: [phone]  24/7 Pager:  [phone]

## 2022-09-07 NOTE — Progress Notes (Signed)
Inpatient Rehabilitation Admissions Coordinator   I will place rehab consult for full assessment of potential admit to CIR .  Ottie Glazier, RN, MSN Rehab Admissions Coordinator [phone] 09/07/2022 6:01 PM

## 2022-09-07 NOTE — Progress Notes (Signed)
ANTICOAGULATION CONSULT NOTE  Pharmacy Consult for heparin + Coumadin Indication:  LVAD  Allergies  Allergen Reactions   Amiodarone     Severe tremors   Percocet [Oxycodone-Acetaminophen] Itching    Patient Measurements: Height: 5\' 7"  (170.2 cm) Weight: 80.2 kg (176 lb 12.9 oz) IBW/kg (Calculated) : 66.1 Heparin Dosing Weight: 87kg  Vital Signs: Temp: 99 F (37.2 C) (08/22 0400) Temp Source: Oral (08/22 0400) BP: 93/81 (08/22 0700) Pulse Rate: 129 (08/22 0700)  Labs: Recent Labs    09/05/22 2217 09/06/22 0359 09/06/22 0635 09/07/22 0407 09/07/22 0447  HGB 9.1* 8.6*  --  8.6*  --   HCT 28.7* 27.3*  --  27.9*  --   PLT 400 389  --  435*  --   APTT  --  166* 51* 88* 33  LABPROT  --  16.0*  --  15.7* 15.1  INR  --  1.3*  --  1.2 1.2  HEPARINUNFRC  --  0.67 0.10* 0.47 <0.10*  CREATININE 0.72 1.03  --  0.75  --     Estimated Creatinine Clearance: 100.8 mL/min (by C-G formula based on SCr of 0.75 mg/dL).   Medical History: Past Medical History:  Diagnosis Date   CAD (coronary artery disease)    CHF (congestive heart failure) (HCC)    GERD (gastroesophageal reflux disease)    Hyperlipidemia    Hypertension    Systolic heart failure (HCC) 2021   LVEF 18%, RVEF 38% on cardiac MRI 12/19/2019. possible cardiac sarcoidosis.   Wide-complex tachycardia 2021   wears LifeVest     Assessment: 57yoM on apixaban PTA for hx AF admitted for LVAD workup. Pt s/p HM3 implant on 8/13 c/b acute CVA postop. Head CT 8/17 stable, ok to start low dose heparin per neuro.   Heparin level <0.1 as expected on heparin drip 500uts/hr - no titration planned. INR 1.2, CBC and LDH stable.  Goal of Therapy:  Heparin level <0.1 units/ml Monitor platelets by anticoagulation protocol: Yes   Plan:  Continue Heparin 500 units/h - no up titrations  Daily heparin level and CBC Coumadin 2mg  daily  Daily INR   Fredonia Highland, PharmD, BCPS, [facility] Clinical Pharmacist [phone] Please check  AMION for all [facility] numbers 09/07/2022

## 2022-09-07 NOTE — Telephone Encounter (Signed)
Left voicemail to remind patient of CPX appointment on Thursday 8/29 at 11am. Provided patient with call back [phone] if cancellation/reschedule is necessary.       Reggy Eye, MS, ACSM, [facility] Clinical Exercise Physiologist/ Health and Wellness Coach

## 2022-09-07 NOTE — Progress Notes (Signed)
Orthopedic Tech Progress Note Patient Details:  Charles Holmes [birth year] [id number]  Ortho Devices Type of Ortho Device: Roland Rack boot Ortho Device/Splint Location: BLE Ortho Device/Splint Interventions: Ordered, Application, Adjustment   Post Interventions Patient Tolerated: Well Instructions Provided: Care of device  Swade Shonka Carmine Savoy 09/07/2022, 12:36 PM

## 2022-09-07 NOTE — Progress Notes (Signed)
HeartMate 3 Rounding Note  POD #9 Implant HM-3  Subjective:   57 year old male with cardiac sarcoidosis, nonischemic cardiomyopathy, and advanced heart failure refractory to optimal medical therapy underwent HeartMate 3 implantation on August 13 and required preoperative support with intra-aortic balloon pump and inotropic therapy.  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 Off anticoagulation now  due to mild hemorrhagic extension by f/u CT.  Patient was started on IV antibiotics postop day 6 for elevated white count and fever.  Cultures are negative.  Chest x-ray shows only mild atelectasis no significant effusion.  Sternal incision looks clearing and dry.  Temperature curve and white count continue to improve.  Blood cultures sputum culture and urine cultures remain negative.  Chest x-ray improving with significant diuresis.  Patient's left-sided weakness is being treated with physical therapy and his mobility is improving, able to stand and pivot to the chair.   PharmD is slowly loading coumadin with low dose heparin bridge.  INR currently 1.3    LVAD INTERROGATION:  HeartMate II LVAD:  Flow 4.4  liters/min, speed 5300 rpm, power 3.6, PI 4.1.  Controller intact.   Objective:    Vital Signs:   Temp:  [97.7 F (36.5 C)-100 F (37.8 C)] 98 F (36.7 C) (08/22 1101) Pulse Rate:  [98-221] 107 (08/22 0845) Resp:  [17-24] 23 (08/22 0900) BP: (73-104)/(42-88) 104/88 (08/22 0900) SpO2:  [84 %-97 %] 94 % (08/22 0845) Weight:  [80.2 kg] 80.2 kg (08/22 0500) Last BM Date : 09/06/22 Mean arterial Pressure 88-92 mm  Hg  Intake/Output:  Exam  General -alert moving L side better, speech better HEENT:-No bleeding from mouth at extraction sits Neck: supple. JVP . Carotids without bruits. No lymphadenopathy or thryomegaly appreciated. Cor: Mechanical heart sounds with LVAD hum present. Lungs: clear Abdomen: soft, nontender, nondistended. No hepatosplenomegaly. No bruits or masses. Good bowel sounds. Extremities: no cyanosis, clubbing, rash, edema Neuro: alert & orientedx3, cranial nerves grossly intact. moves all 4 extremities . Affect pleasant  Telemetry: paced with PPM 90/min  Labs: Basic Metabolic Panel: Recent Labs  Lab 09/02/22 0527 09/02/22 1633 09/03/22 0333 09/04/22 0447 09/05/22 0348 09/05/22 2217 09/06/22 0359 09/07/22 0407  NA 134*  --  136 134* 132* 134* 134* 134*  K 3.7  --  4.4 4.1 3.6 3.4* 4.0 3.8  CL 105  --  103 96* 93* 93* 95* 93*  CO2 24  --  26 28 30 31 30 27   GLUCOSE 152*  --  183* 134* 147* 190* 186* 213*  BUN 22*  --  25* 22* 25* 24* 27* 28*  CREATININE 0.91  --  0.85 0.68 0.73 0.72 1.03 0.75  CALCIUM 8.2*  --  8.2* 8.2* 8.0* 7.7* 7.8* 7.9*  MG 2.1 2.0 1.8 1.8 1.9  --   --  1.6*  PHOS 2.4* 2.3* 1.7* 2.3* 2.9  --   --   --     Liver Function Tests: Recent Labs  Lab 09/03/22 0333 09/04/22 0447 09/05/22 0348 09/06/22 0359 09/07/22 0407  AST 68* 63* 58* 69* 93*  ALT 34 36 37 50* 72*  ALKPHOS 147* 156* 131* 127* 159*  BILITOT 2.7* 1.8* 1.9*  1.2 1.0  PROT 5.5* 5.3* 5.1* 4.9* 5.2*  ALBUMIN 2.3* 2.2* 1.9* 1.7* 1.7*   No results for input(s): "LIPASE", "AMYLASE" in the last 168 hours. No results for input(s): "AMMONIA" in the last 168 hours.  CBC: Recent Labs  Lab 09/04/22 0447 09/05/22 0348 09/05/22 2217 09/06/22 0359 09/07/22 0407  WBC 16.1* 23.0* 29.0* 26.6* 21.0*  HGB 9.9* 9.7* 9.1* 8.6* 8.6*  HCT 31.3* 30.3* 28.7* 27.3* 27.9*  MCV 97.8 98.7 96.6 98.9 100.4*  PLT 360 395 400 389 435*    INR: Recent Labs  Lab 09/04/22 0447 09/05/22 0348  09/06/22 0359 09/07/22 0407 09/07/22 0447  INR 1.1 1.1 1.3* 1.2 1.2    Other results: EKG:   Imaging: DG Chest Port 1 View  Result Date: 09/07/2022 CLINICAL DATA:  LVAD EXAM: PORTABLE CHEST 1 VIEW COMPARISON:  09/06/2022. FINDINGS: Enlarged cardiac silhouette. Mild vascular congestion. Left-sided small pleural effusion. No pneumothorax. There is a right-sided PICC tip overlies mid SVC. Left-sided pacer and left ventricular cyst devices. Left atrial clip. Median sternotomy wires. Feeding tube tip below the diaphragm and off the x-ray. IMPRESSION: Enlarged cardiac silhouette. Mild vascular congestion. Small left-sided pleural effusion. Electronically Signed   By: Layla Maw M.D.   On: 09/07/2022 10:14   DG Chest Port 1 View  Result Date: 09/06/2022 CLINICAL DATA:  Left ventricular assist device. EXAM: PORTABLE CHEST 1 VIEW COMPARISON:  September 05, 2022. FINDINGS: Stable cardiomegaly. Left ventricular assist device and feeding tube are unchanged. Left-sided defibrillator is unchanged. Right-sided PICC line is unchanged. No pneumothorax. Stable bibasilar atelectasis is noted possible pleural effusions. IMPRESSION: Stable support apparatus. Stable bibasilar opacities as noted above. Electronically Signed   By: Lupita Raider M.D.   On: 09/06/2022 10:00     Medications:     Scheduled Medications:  sodium chloride   Intravenous Once   aspirin  324 mg Per Tube Daily   atorvastatin  80 mg Per Tube Daily   Chlorhexidine Gluconate Cloth  6 each Topical Q0600   dorzolamide-timolol  1 drop Both Eyes BID   feeding supplement  237 mL Oral TID BM   feeding supplement (PROSource TF20)  60 mL Per Tube BID   feeding supplement (VITAL 1.5 CAL)  780 mL Per Tube Q24H   fiber supplement (BANATROL TF)  60 mL Per Tube BID   furosemide  40 mg Intravenous Once   insulin aspart  0-24 Units Subcutaneous Q4H   latanoprost  1 drop Both Eyes QHS   melatonin  3 mg Oral QHS   mexiletine  250 mg Per Tube BID    mometasone-formoterol  2 puff Inhalation BID   multivitamin with minerals  1 tablet Per Tube Daily   mouth rinse  15 mL Mouth Rinse 4 times per day   oxymetazoline  1 spray Each Nare BID   pantoprazole (PROTONIX) IV  40 mg Intravenous QHS   polyethylene glycol  17 g Per Tube Daily   potassium chloride  20 mEq Per Tube BID   potassium chloride  40 mEq Per Tube Once   QUEtiapine  25 mg Oral QHS   sildenafil  20 mg Per Tube TID   sodium chloride flush  3 mL Intravenous Q12H   umeclidinium bromide  1 puff Inhalation Daily   warfarin  2 mg Oral q1600   Warfarin - Pharmacist Dosing Inpatient   Does not apply q1600    Infusions:  sodium chloride Stopped (09/07/22 0832)   epinephrine 8 mcg/min (09/07/22  1030)   heparin 500 Units/hr (09/07/22 0900)   meropenem (MERREM) IV Stopped (09/07/22 0556)   milrinone 0.375 mcg/kg/min (09/07/22 0900)   vancomycin 750 mg (09/07/22 1100)    PRN Medications: sodium chloride, acetaminophen, albuterol, dextrose, ondansetron (ZOFRAN) IV, mouth rinse, oxyCODONE, sodium chloride, sodium chloride flush, traMADol   Assessment:  57 year old patient with sarcoidosis, COPD history of stroke  and nonischemic cardiomyopathy with preoperative cardiogenic shock requiring balloon pump and inotropic therapy.  Postoperative  right middle cerebral artery CVA treated with neuro-- radiology invention,   Postop RV dysfunction req inhaled NO and inotropes with incresad bilirubin 1.2>> 4.9 Now off inhaled NO and bilirubin improving Patient now diuresing well, will continue low-dose epinephrine Plan/Discussion:   RV dysfunction status post VAD implant-continue low-dose epinephrine, wean as tolerated.  Postoperative right middle cerebral CVA.  Initial hemorrhagic extension was mild and has started to improve. Low dose heparin started.  Coumadin dosing has started by PharmD  Swallow function improved by FEES- on sips Check calorie count and transition from tube feeds  to oral intake when safe.  He ate approximately half of his heart healthy breakfast this a.m.  Continue IV vancomycin meropenem at least 1 week for fever and elevated white count.  I reviewed the LVAD parameters from today, and compared the results to the patient's prior recorded data.  No programming changes were made.  The LVAD is functioning within specified parameters.  The patient performs LVAD self-test daily.  LVAD interrogation was negative for any significant power changes, alarms or PI events/speed drops.  LVAD equipment check completed and is in good working order.  Back-up equipment present.   LVAD education done on emergency procedures and precautions and reviewed exit site care.  Length of Stay: [address]  Lovett Sox 09/07/2022, 11:10 AM

## 2022-09-07 NOTE — Progress Notes (Signed)
Physical Therapy Treatment Patient Details Name: Charles Holmes MRN: [id number] DOB: [birth year] Today's Date: 09/07/2022   History of Present Illness Pt is 57 year old presented to [facility] on  08/10/22 for acute on chronic systolic heart failure. Pt had IABP placed on 08/25/22 for ongoing cardiogenic shock. Underwent placement of LVAD on 8/13. Developed lt sided weakness on 8/14. Rt MCA CVA. Underwent mechanical thrombectomy by IR. Extubated 8/15.  PMH - CAD, CHF, HLD, HTN, systolic HF, ICD implant, rt CVA.  LVAD placement and new stroke R MCA M1.    PT Comments  Pt needed lots of encouragement today.  Still progressing well.  Emphasis on warm up exercises, transitions, sit to stands x4 from multiple surfaces, standing activity in the RW including low amplitude steps in place, w/shifts, forward/backward steps.  Ended in the recliner.    If plan is discharge home, recommend the following:     Can travel by private vehicle        Equipment Recommendations  Other (comment) (To be determined)    Recommendations for Other Services       Precautions / Restrictions Precautions Precautions: Sternal;Fall;Other (comment) Precaution Comments: LVAD Restrictions Weight Bearing Restrictions: Yes (sternal) RUE Weight Bearing: Non weight bearing RLE Weight Bearing: Non weight bearing Other Position/Activity Restrictions: Sternal Precautions     Mobility  Bed Mobility Overal bed mobility: Needs Assistance Bed Mobility: Supine to Sit     Supine to sit: Mod assist     General bed mobility comments: cued for sternal precautions and building some LE momentum to assist coming up.  needing mod truncal assist    Transfers Overall transfer level: Needs assistance Equipment used: 2 person hand held assist, 1 person hand held assist (face to face transfer with L knee blocked) Transfers: Sit to/from Stand, Bed to chair/wheelchair/BSC Sit to Stand: +2 physical assistance, Mod assist, +2  safety/equipment (x4 from bed and BSC)   Step pivot transfers: Max assist, +2 physical assistance       General transfer comment: Sit to stand x4 overall.  1 from lowered bed, 3 from BSC height.  Stability and RW assist for step pivot from bed to the [facility].  standing at [facility] x3 for ~ 20 to 25 sec  for peri care x3    Ambulation/Gait             Pre-gait activities: w/shifting and low amplitude steps in place during 1 of the standing trials.     Stairs             Wheelchair Mobility     Tilt Bed    Modified Rankin (Stroke Patients Only) Modified Rankin (Stroke Patients Only) Pre-Morbid Rankin Score: No symptoms Modified Rankin: Moderately severe disability     Balance Overall balance assessment: Needs assistance Sitting-balance support: Feet supported, Single extremity supported Sitting balance-Leahy Scale: Fair   Postural control: Left lateral lean Standing balance support: Single extremity supported, Bilateral upper extremity supported, During functional activity Standing balance-Leahy Scale: Poor Standing balance comment: reliant on external support and or AD                            Cognition Arousal: Alert Behavior During Therapy: Restless Overall Cognitive Status: Impaired/Different from baseline (NT formally)                       Memory: Decreased recall of precautions (requires constant verbal/tactile cues to adhere  to sternal precautions functionally) Following Commands: Follows one step commands consistently, Follows one step commands with increased time   Awareness: Emergent Problem Solving: Slow processing, Requires verbal cues, Difficulty sequencing          Exercises General Exercises - Upper Extremity Shoulder Flexion: AAROM, AROM, 10 reps, Supine Other Exercises Other Exercises: hip/knee flex/ext x10 reps with graded assist in flexion and graded resistance in extension    General Comments        Pertinent  Vitals/Pain Pain Assessment Pain Assessment: Faces Faces Pain Scale: Hurts little more Pain Location: chest Pain Descriptors / Indicators: Guarding, Sore Pain Intervention(s): Monitored during session    Home Living                          Prior Function            PT Goals (current goals can now be found in the care plan section) Acute Rehab PT Goals PT Goal Formulation: With patient Time For Goal Achievement: 09/14/22 Potential to Achieve Goals: Fair Progress towards PT goals: Progressing toward goals    Frequency    Min 1X/week      PT Plan Current plan remains appropriate    Co-evaluation              AM-PAC PT "6 Clicks" Mobility   Outcome Measure  Help needed turning from your back to your side while in a flat bed without using bedrails?: Total Help needed moving from lying on your back to sitting on the side of a flat bed without using bedrails?: Total Help needed moving to and from a bed to a chair (including a wheelchair)?: Total Help needed standing up from a chair using your arms (e.g., wheelchair or bedside chair)?: Total Help needed to walk in hospital room?: Total Help needed climbing 3-5 steps with a railing? : Total 6 Click Score: 6    End of Session   Activity Tolerance: Patient limited by fatigue (l) Patient left: with call bell/phone within reach;with nursing/sitter in room;in bed   PT Visit Diagnosis: Other abnormalities of gait and mobility (R26.89);Difficulty in walking, not elsewhere classified (R26.2);Hemiplegia and hemiparesis Hemiplegia - Right/Left: Left Hemiplegia - dominant/non-dominant: Non-dominant Hemiplegia - caused by: Cerebral infarction     Time: 1610-9604 PT Time Calculation (min) (ACUTE ONLY): 43 min  Charges:    $Therapeutic Exercise: 8-22 mins $Therapeutic Activity: 23-37 mins PT General Charges $$ ACUTE PT VISIT: 1 Visit                     09/07/2022  Jacinto Halim., PT Acute Rehabilitation  Services [phone]  (office)   Eliseo Gum Darlina Mccaughey 09/07/2022, 3:54 PM

## 2022-09-07 NOTE — Progress Notes (Signed)
Outpatient HF CSW visited bedside to check in.  Pt did not speak during visit but acknowledges CSW with head nods- indicated he had no needs at this time. CSW continues to follow for supportive needs throughout implant hospitalization.      Burna Sis, LCSW Clinical Social Worker [facility] Desk#: [phone] Cell#: [phone]

## 2022-09-07 NOTE — Progress Notes (Signed)
This chaplain is present with the Pt. for F/U spiritual care. The Pt. is anticipating a bowel movement and asks for use of the bed pan. The chaplain is appreciative of the RN and NT assistance.  The Pt. explains to the chaplain his frequent requests for the bed pan are connected to his care and concern for the hospital staff in having to clean him up. Otherwise, the chaplain understands through reflective listening the Pt. is tired today and trying in his work with therapy.   The chaplain observes the Pt. preference for communicating with his eyes closed, but answers appropriately in the conversation.  The Pt. accepts the chaplain's invitation for prayer and F/U spiritual care.  Chaplain Stephanie Acre [phone]

## 2022-09-07 NOTE — Progress Notes (Signed)
Calorie Count Note  48 hour calorie count ordered.  Diet: Dysphagia 1, Nectar Liquids on 8/20, advanced to Dysphagia 2 with Thin Liquids after breakfast on 8/21 Supplements: Ensure Enlive/Plus, Magic Cup  8/20 (Dysphagia 1, Nectar Thick) Lunch: 100% puree peaches, 2 bites of Magic Cup (100 kcals, 0g protein) Dinner:  0% of meal tray, [employer] Sundae Milkshake (brought in by family)-33% (280 kcals, 8 g of protein) Evening Snack:  100% Ensure (350 kcals, 20 g protein)  Total: 730 kcals, 28 g of protein  8/21 (adv to Dysphagia 2, Thins after Breakfast) Breakfast: 4 ounce Orange, Applesauce (150 kcals, 0 g protein) AM Snack: 100% Ensure (350 kcals, 20 g of protein) Lunch: 10% minced chicken, 20% puree pineapple, 50% magic cup, sips of unsweet tea (215 kcals,5 g protein Snack: Diet Sprite (0 kcals, 0 g protein) Dinner: no information available  Total: 715 kcals, 25 g of protein  8/22 Breakfast: 10% Cream of Wheat, 10% [place] Sausage, 100% Applesauce, 100% Orange Juice, 25% Milk (200 kcals, 3 g of protein)  Total over 48 hours (based on available information): 825 kcals (40% of low end of estimated calorie range), 28 g of protein (25% low end of estimated protein needs). RD communicated results to Brynda Peon NP with Advanced HF  Not meeting calorie or protein needs via oral route.   Pt really enjoys the Ensure supplements, encouraged pt to drink TID. Pt continues to require supplemental TF at this time. Currently meeting protein and calorie needs with nocturnal supplemental TF and po diet  RN reports pt currently not wanting to drink AM Ensure as pt on bed pan but RN also indicates that pt wants to constantly be on the bed pan.   Intervention:  1) Continue Nocturnal TF Vital 1.5 at 65 ml/hr x 12 hours Pro-Source TF20 60 mL BID TF provides 93 g of protein, 1330 kcals, 593 mL of free water 2) Continue Ensure Enlive/Ensure Plus HP po TID, each supplement provides 350 kcal and 20  grams of protein. 3) Continue Calorie Count   Romelle Starcher MS, RDN, LDN, CNSC Registered Dietitian 3 Clinical Nutrition RD Pager and On-Call Pager Number Located in [place]

## 2022-09-07 NOTE — Progress Notes (Addendum)
Patient ID: Charles Holmes, male   DOB: [birth year], 57 y.o.   MRN: [id number]     Advanced Heart Failure Rounding Note  PCP-Cardiologist: Norman Herrlich, MD  [facility]: Dr. Gala Romney   Subjective:    7/25: Admit with cardiogenic shock. Started milrinone and NE. 8/6 S/P 13 teeth extractions  8/9 IABP placed 8/13 S/p HM III LVAD implant + clipping left atrial appendage d/t severe thickening and invagination of mitral valve annulus impeding flows 8/14 Left-sided hemiplegia. CT head with acute R MCA infarct. IR for thrombectomy. CT head with small to moderate area of hemorrhagic conversion 8/15 extubated  8/17 Low dose heparin gtt restarted, CT head with decreased size of hemorrhagic stroke.  8/18 Neurology signed off 8/19: Limited echo: Mod RV dysfunction, better mitral inflow into the LV and VAD position satisfactory.  Post Op Day #9  Remains on 0.375 milrinone + 9 Epi.   Co-ox 78%. CVP 7/8   On empiric vancomycin/mero. WBC 13.8>16.1>23>26>21K. Cultures no growth  Feels ok this morning just a little sleepy. Sat in the chair yesterday.    LVAD Interrogation HM III: Speed: 5350 Flow: 4.7 PI: 3.1 Power: 3.9. No PI events today   Objective:   Weight Range: 80.2 kg Body mass index is 27.69 kg/m.   Vital Signs:   Temp:  [98.1 F (36.7 C)-100 F (37.8 C)] 99 F (37.2 C) (08/22 0400) Pulse Rate:  [105-221] 129 (08/22 0700) Resp:  [18-24] 19 (08/22 0700) BP: (72-99)/(42-82) 93/81 (08/22 0700) SpO2:  [84 %-97 %] 84 % (08/22 0700) Weight:  [80.2 kg] 80.2 kg (08/22 0500) Last BM Date : 09/06/22  Weight change: Filed Weights   09/05/22 0500 09/06/22 0500 09/07/22 0500  Weight: 83.8 kg 81.1 kg 80.2 kg    Intake/Output:   Intake/Output Summary (Last 24 hours) at 09/07/2022 0705 Last data filed at 09/07/2022 0700 Gross per 24 hour  Intake 3428.56 ml  Output 5375 ml  Net -1946.44 ml   Physical Exam  CVP 7/8 General:  elderly appearing. No resp difficulty HEENT: Normal.  +cortrak Neck: supple. JVP ~8. Carotids 2+ bilat; no bruits. No lymphadenopathy or thyromegaly appreciated. Cor: Mechanical heart sounds with LVAD hum present. Lungs: coarse Abdomen: soft, nontender, nondistended. No hepatosplenomegaly. No bruits or masses. Good bowel sounds. Driveline: C/D/I; securement device intact. Dressing around DL site Extremities: no cyanosis, clubbing, rash, +2 ankle edema. PICC RUE Neuro: alert & orientedx3, cranial nerves grossly intact. moves all 4 extremities w/o difficulty. Affect pleasant   Telemetry     ST with BiV pacing 110s (personally reviewed)    Labs    CBC Recent Labs    09/06/22 0359 09/07/22 0407  WBC 26.6* 21.0*  HGB 8.6* 8.6*  HCT 27.3* 27.9*  MCV 98.9 100.4*  PLT 389 435*    Basic Metabolic Panel Recent Labs    40/98/11 0348 09/05/22 2217 09/06/22 0359 09/07/22 0407  NA 132*   < > 134* 134*  K 3.6   < > 4.0 3.8  CL 93*   < > 95* 93*  CO2 30   < > 30 27  GLUCOSE 147*   < > 186* 213*  BUN 25*   < > 27* 28*  CREATININE 0.73   < > 1.03 0.75  CALCIUM 8.0*   < > 7.8* 7.9*  MG 1.9  --   --  1.6*  PHOS 2.9  --   --   --    < > = values in this interval not  displayed.    BNP: BNP (last 3 results) Recent Labs    08/10/22 1142 08/30/22 0258 09/04/22 2144  BNP 1,905.0* 922.3* 1,050.6*     Imaging    No results found.   Medications:     Scheduled Medications:  sodium chloride   Intravenous Once   aspirin  324 mg Per Tube Daily   atorvastatin  80 mg Per Tube Daily   Chlorhexidine Gluconate Cloth  6 each Topical Q0600   dorzolamide-timolol  1 drop Both Eyes BID   feeding supplement  237 mL Oral TID BM   feeding supplement (PROSource TF20)  60 mL Per Tube BID   feeding supplement (VITAL 1.5 CAL)  780 mL Per Tube Q24H   fiber supplement (BANATROL TF)  60 mL Per Tube BID   furosemide  80 mg Intravenous BID   insulin aspart  0-24 Units Subcutaneous Q6H   latanoprost  1 drop Both Eyes QHS   melatonin  3 mg Oral  QHS   mexiletine  250 mg Per Tube BID   mometasone-formoterol  2 puff Inhalation BID   multivitamin with minerals  1 tablet Per Tube Daily   mouth rinse  15 mL Mouth Rinse 4 times per day   oxymetazoline  1 spray Each Nare BID   pantoprazole (PROTONIX) IV  40 mg Intravenous QHS   polyethylene glycol  17 g Per Tube Daily   potassium chloride  20 mEq Per Tube BID   potassium chloride  20 mEq Per Tube Q4H   QUEtiapine  25 mg Oral QHS   sildenafil  20 mg Per Tube TID   sodium chloride flush  3 mL Intravenous Q12H   umeclidinium bromide  1 puff Inhalation Daily   warfarin  2 mg Oral q1600   Warfarin - Pharmacist Dosing Inpatient   Does not apply q1600    Infusions:  sodium chloride 10 mL/hr at 09/07/22 0700   epinephrine 9 mcg/min (09/07/22 0700)   heparin 500 Units/hr (09/07/22 0700)   meropenem (MERREM) IV Stopped (09/07/22 0556)   milrinone 0.375 mcg/kg/min (09/07/22 0700)   vancomycin Stopped (09/07/22 0015)    PRN Medications: sodium chloride, acetaminophen, albuterol, dextrose, ondansetron (ZOFRAN) IV, mouth rinse, oxyCODONE, sodium chloride, sodium chloride flush, traMADol   Patient Profile  Charles Holmes is a 57 y.o. 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 Admitted with cardiogenic shock, stabilized and underwent HM3 LVAD. Post implant course c/b acute CVA.   Assessment/Plan  1.  Acute on chronic Systolic HF-->Cardiogenic Shock  - Diagnosed 11/2019. Presented with VT. LHC 70% LAD  - cMRI 12/21 concerning for sarcoid and EF 18%.  - PET 2/22 at [facility] EF 25% + active sarcoid - Echo 08/26/20 EF < 20% severely dilated LV RV mildly decreased.  - Medtronic CRT-D upgrade in 06/08/21 - Echo 07/10/22: EF <20%, RV okay, mod pericardial effusion, mod Charles/TR - Admitted 07/25 with cardiogenic shock. - RHC: Nonobstructive CAD, severely elevated filling pressures and low Fick CO/CI (2.7/1.4) - 08/13 HM III LVAD implant +  clipping LAA d/t severe thickening and invagination of mitral valve annulus impeding flows.  - Apical core sent to pathology to confirm diagnosis of cardiac sarcoid >> pending - Speed increased to 5300 on 08/14. On milrinone 0.375 + Epi 5, LVAD flow 4.5. LDH stable - Wean epi as tolerated. Continue milrinone.  - continue sildenafil, cut back to 20 mg tid to try to wean  off pressor suppport.  - CVP 7/8, good UOP yesterday with 80 IV lasix BID. Give 80 IV lasix this morning and 40 IV lasix in the afternoon. Possibly Torsemide tomorrow (previously kept getting overloaded on PO lasix and required metolazone) - UNNA boots/ TED hose - CT head 09/02/22 w/ improved hemorrhagic CVA, now back on low dose heparin per neurology.   - Warfarin restarted. INR 1.3 dosing per PharmD  2.  Acute stroke - Hx CVA 06/24 -Admitted 06/24 w/ R MCA stroke. S/p TPA and mechanical clot extraction. No residual deficits. Likely cardioembolic in setting of severe LV dysfunction. - Developed left sided weakness 08/14. CTA with R MCA infarct. Taken to IR for thrombectomy - Repeat CT head with small to moderate size hemorrhagic conversion. CT head this am stable. - D/w neuro, repeat head CT on 8/17 w/ improved hemorrhagic CVA, now back on low dose heparin per neurology.  - Back on warfarin - neurology signed off 09/03/22.  - C/w PT/OT.  - Continue ASA 325  - Has Cor-Trak and still getting nocturnal tube feeds, intake improving. On dysphagia 1 diet. Dietitian following   - will need CIR once medically stable for d/c (team following)   3. 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 Continue mexiletine  - now s/p ICD.   4. CAD - LHC 12/07/19 70-% LAD, no intervention - LHC 8/24 non obstructive CAD.  - Continue statin. On aspirin for VAD.  5. Cardiac sarcoid - PET 2/22 at [facility] EF 25% + active sarcoid - Has completed prednisone.  - holding methotrexate w/ recent surgery  - Sent apical core to  pathology to confirm diagnosis of cardiac sarcoid  6. 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 AKI - suspect cardiorenal, improved w/ inotropic support - Creatinine 0.75 today.  - follow BMP w/ diuresis    8. Iron deficiency anemia/ Post-op anemia - recent T sat 15%, scheduled for OP feraheme. Will complete inpatient  - Transfused 1 u RBCs 8/15 - Hgb 8.6 today.     9. Pulmonary  - PFTs with severe obstructive defect, response to bronchodilator. FEV1 1.04L, FEV1/FVC 48% - extubated 8/15  - Cultures NGTD.   10. Elevated LFTs -AST/ALT improved -T bili 4.6>>5.1>>4.2>>2.7>>1.8>1.9>1.2>1  11. ID - WBC 126>13.8>16.1>23>26K  - cont Merrem/vanc  - Cultures with NGTD - Incentive spirometer and flutter valve at bedside, encouraged to use  Ambulate!   Length of Stay: 22  Alen Bleacher, NP  09/07/2022, 7:05 AM  VAD Team Pager [phone] (7am - 7am)   Advanced Heart Failure Team Pager [phone] (M-F; 7a - 5p)  Please contact [facility] for night-coverage after hours (5p -7a ) and weekends on [email or web address]

## 2022-09-08 ENCOUNTER — Inpatient Hospital Stay (HOSPITAL_COMMUNITY): Payer: Medicare Other

## 2022-09-08 DIAGNOSIS — I5023 Acute on chronic systolic (congestive) heart failure: Secondary | ICD-10-CM | POA: Diagnosis not present

## 2022-09-08 DIAGNOSIS — R570 Cardiogenic shock: Secondary | ICD-10-CM | POA: Diagnosis not present

## 2022-09-08 DIAGNOSIS — G8194 Hemiplegia, unspecified affecting left nondominant side: Secondary | ICD-10-CM

## 2022-09-08 DIAGNOSIS — I428 Other cardiomyopathies: Secondary | ICD-10-CM | POA: Diagnosis not present

## 2022-09-08 DIAGNOSIS — I69391 Dysphagia following cerebral infarction: Secondary | ICD-10-CM

## 2022-09-08 DIAGNOSIS — Z515 Encounter for palliative care: Secondary | ICD-10-CM | POA: Diagnosis not present

## 2022-09-08 DIAGNOSIS — I63411 Cerebral infarction due to embolism of right middle cerebral artery: Secondary | ICD-10-CM | POA: Diagnosis not present

## 2022-09-08 LAB — CBC
HCT: 29.4 % — ABNORMAL LOW (ref 39.0–52.0)
Hemoglobin: 9.2 g/dL — ABNORMAL LOW (ref 13.0–17.0)
MCH: 31.4 pg (ref 26.0–34.0)
MCHC: 31.3 g/dL (ref 30.0–36.0)
MCV: 100.3 fL — ABNORMAL HIGH (ref 80.0–100.0)
Platelets: 444 10*3/uL — ABNORMAL HIGH (ref 150–400)
RBC: 2.93 MIL/uL — ABNORMAL LOW (ref 4.22–5.81)
RDW: 17.6 % — ABNORMAL HIGH (ref 11.5–15.5)
WBC: 24.6 10*3/uL — ABNORMAL HIGH (ref 4.0–10.5)
nRBC: 0 % (ref 0.0–0.2)

## 2022-09-08 LAB — COMPREHENSIVE METABOLIC PANEL
ALT: 103 U/L — ABNORMAL HIGH (ref 0–44)
AST: 123 U/L — ABNORMAL HIGH (ref 15–41)
Albumin: 1.8 g/dL — ABNORMAL LOW (ref 3.5–5.0)
Alkaline Phosphatase: 174 U/L — ABNORMAL HIGH (ref 38–126)
Anion gap: 5 (ref 5–15)
BUN: 33 mg/dL — ABNORMAL HIGH (ref 6–20)
CO2: 32 mmol/L (ref 22–32)
Calcium: 7.7 mg/dL — ABNORMAL LOW (ref 8.9–10.3)
Chloride: 94 mmol/L — ABNORMAL LOW (ref 98–111)
Creatinine, Ser: 0.95 mg/dL (ref 0.61–1.24)
GFR, Estimated: >60 mL/min (ref >60)
Glucose, Bld: 181 mg/dL — ABNORMAL HIGH (ref 70–99)
Potassium: 3.9 mmol/L (ref 3.5–5.1)
Sodium: 131 mmol/L — ABNORMAL LOW (ref 135–145)
Total Bilirubin: 1.5 mg/dL — ABNORMAL HIGH (ref 0.3–1.2)
Total Protein: 5.4 g/dL — ABNORMAL LOW (ref 6.5–8.1)

## 2022-09-08 LAB — COOXEMETRY PANEL
Carboxyhemoglobin: 2.5 % — ABNORMAL HIGH (ref 0.5–1.5)
Methemoglobin: 1 % (ref 0.0–1.5)
O2 Saturation: 67.5 %
Total hemoglobin: 9.6 g/dL — ABNORMAL LOW (ref 12.0–16.0)

## 2022-09-08 LAB — GLUCOSE, CAPILLARY
Glucose-Capillary: 182 mg/dL — ABNORMAL HIGH (ref 70–99)
Glucose-Capillary: 154 mg/dL — ABNORMAL HIGH (ref 70–99)
Glucose-Capillary: 127 mg/dL — ABNORMAL HIGH (ref 70–99)
Glucose-Capillary: 120 mg/dL — ABNORMAL HIGH (ref 70–99)
Glucose-Capillary: 145 mg/dL — ABNORMAL HIGH (ref 70–99)
Glucose-Capillary: 150 mg/dL — ABNORMAL HIGH (ref 70–99)

## 2022-09-08 LAB — GAMMA GT: GGT: 216 U/L — ABNORMAL HIGH (ref 7–50)

## 2022-09-08 LAB — PROTIME-INR
INR: 1 (ref 0.8–1.2)
Prothrombin Time: 13.8 s (ref 11.4–15.2)

## 2022-09-08 LAB — APTT: aPTT: 35 s (ref 24–36)

## 2022-09-08 LAB — HEPARIN LEVEL (UNFRACTIONATED): Heparin Unfractionated: <0.1 [IU]/mL — ABNORMAL LOW (ref 0.30–0.70)

## 2022-09-08 LAB — LACTATE DEHYDROGENASE: LDH: 350 U/L — ABNORMAL HIGH (ref 98–192)

## 2022-09-08 LAB — CG4 I-STAT (LACTIC ACID): Lactic Acid, Venous: 1 mmol/L (ref 0.5–1.9)

## 2022-09-08 LAB — VANCOMYCIN, RANDOM: Vancomycin Rm: 13 ug/mL

## 2022-09-08 LAB — MAGNESIUM: Magnesium: 2.1 mg/dL (ref 1.7–2.4)

## 2022-09-08 MED ORDER — POTASSIUM CHLORIDE 20 MEQ PO PACK
40.0000 meq | PACK | Freq: Two times a day (BID) | ORAL | Status: DC
  Administered 2022-09-08 – 2022-09-09 (×4): 40 meq
  Filled 2022-09-08 (×4): qty 2

## 2022-09-08 MED ORDER — WARFARIN SODIUM 2.5 MG PO TABS
2.5000 mg | ORAL_TABLET | Freq: Once | ORAL | Status: AC
  Administered 2022-09-08: 2.5 mg via ORAL
  Filled 2022-09-08: qty 1

## 2022-09-08 MED ORDER — ATORVASTATIN CALCIUM 80 MG PO TABS
80.0000 mg | ORAL_TABLET | Freq: Every day | ORAL | Status: DC
  Administered 2022-09-09 – 2022-09-27 (×19): 80 mg via ORAL
  Filled 2022-09-08 (×19): qty 1

## 2022-09-08 MED ORDER — QUETIAPINE FUMARATE 50 MG PO TABS
50.0000 mg | ORAL_TABLET | Freq: Every day | ORAL | Status: DC
  Administered 2022-09-08 – 2022-09-12 (×5): 50 mg via ORAL
  Filled 2022-09-08 (×5): qty 1

## 2022-09-08 MED ORDER — FUROSEMIDE 10 MG/ML IJ SOLN
40.0000 mg | Freq: Two times a day (BID) | INTRAMUSCULAR | Status: AC
  Administered 2022-09-08: 40 mg via INTRAVENOUS
  Filled 2022-09-08: qty 4

## 2022-09-08 MED ORDER — FUROSEMIDE 10 MG/ML IJ SOLN
80.0000 mg | Freq: Two times a day (BID) | INTRAMUSCULAR | Status: DC
  Administered 2022-09-08: 80 mg via INTRAVENOUS
  Filled 2022-09-08: qty 8

## 2022-09-08 MED ORDER — ACETAMINOPHEN 160 MG/5ML PO SOLN
650.0000 mg | ORAL | Status: DC | PRN
  Administered 2022-09-22: 650 mg via ORAL
  Filled 2022-09-08: qty 20.3

## 2022-09-08 MED ORDER — ASPIRIN 81 MG PO CHEW
324.0000 mg | CHEWABLE_TABLET | Freq: Every day | ORAL | Status: DC
  Administered 2022-09-09 – 2022-09-18 (×10): 324 mg via ORAL
  Filled 2022-09-08 (×10): qty 4

## 2022-09-08 MED ORDER — POLYETHYLENE GLYCOL 3350 17 G PO PACK
17.0000 g | PACK | Freq: Every day | ORAL | Status: DC | PRN
  Administered 2022-09-23: 17 g via ORAL
  Filled 2022-09-08: qty 1

## 2022-09-08 MED ORDER — ADULT MULTIVITAMIN W/MINERALS CH
1.0000 | ORAL_TABLET | Freq: Every day | ORAL | Status: DC
  Administered 2022-09-09 – 2022-09-27 (×19): 1 via ORAL
  Filled 2022-09-08 (×19): qty 1

## 2022-09-08 MED ORDER — MEXILETINE HCL 250 MG PO CAPS
250.0000 mg | ORAL_CAPSULE | Freq: Two times a day (BID) | ORAL | Status: DC
  Administered 2022-09-08 – 2022-09-27 (×38): 250 mg via ORAL
  Filled 2022-09-08 (×40): qty 1

## 2022-09-08 MED ORDER — INSULIN ASPART 100 UNIT/ML IJ SOLN
0.0000 [IU] | Freq: Three times a day (TID) | INTRAMUSCULAR | Status: DC
  Administered 2022-09-08 – 2022-09-26 (×22): 2 [IU] via SUBCUTANEOUS

## 2022-09-08 MED ORDER — ALBUMIN HUMAN 25 % IV SOLN
12.5000 g | Freq: Four times a day (QID) | INTRAVENOUS | Status: AC
  Administered 2022-09-08 (×2): 12.5 g via INTRAVENOUS
  Filled 2022-09-08 (×2): qty 50

## 2022-09-08 MED ORDER — POLYETHYLENE GLYCOL 3350 17 G PO PACK: 17.0000 g | PACK | Freq: Every day | ORAL | Status: DC | PRN

## 2022-09-08 MED ORDER — BANATROL TF EN LIQD
60.0000 mL | Freq: Three times a day (TID) | ENTERAL | Status: DC
  Administered 2022-09-08 – 2022-09-17 (×27): 60 mL
  Filled 2022-09-08 (×27): qty 60

## 2022-09-08 MED FILL — Sodium Chloride IV Soln 0.9%: INTRAVENOUS | Qty: 2000 | Status: AC

## 2022-09-08 MED FILL — Heparin Sodium (Porcine) Inj 1000 Unit/ML: INTRAMUSCULAR | Qty: 20 | Status: AC

## 2022-09-08 MED FILL — Electrolyte-R (PH 7.4) Solution: INTRAVENOUS | Qty: 4000 | Status: AC

## 2022-09-08 MED FILL — Sodium Bicarbonate IV Soln 8.4%: INTRAVENOUS | Qty: 50 | Status: AC

## 2022-09-08 NOTE — Progress Notes (Signed)
Drive Line: Existing VAD dressing removed and site care performed using sterile technique. Drive line exit site cleaned with Chlora prep applicators x 2, allowed to dry, and silver strip with gauze applied. Exit site healing and unincorporated, the velour is fully implanted at exit site.  No redness, drainage, tenderness, foul odor or rash noted. 2 suture in place. Drive line anchor secure. Pt has some skin breakdown from tape and 2 blisters...these areas were left OTA. Advance to Monday/ Wednesday/Friday dressing changes. Next dressing change due 09/11/22 by VAD coordinator or nurse champion only.    Simmie Davies RN, BSN VAD Coordinator 24/7 Pager [phone]

## 2022-09-08 NOTE — Progress Notes (Signed)
Inpatient Rehab Admissions:  Inpatient Rehab Consult received.  I met with patient at the bedside for rehabilitation assessment and to discuss goals and expectations of an inpatient rehab admission.  Pt was lethargic but able to participate in discussion. Discussed average length of stay, insurance authorization requirement, discharge home after completion of CIR. Pt acknowledged understanding. Pt interested in pursuing CIR. Pt informed AC that his cousin Dwayne and his wife [name] would be able to provide 24/7 support after discharge. Pt did not have their phone number. Pt gave permission to call his daughter Dorathy Daft. Will continue to follow.  Signed: Wolfgang Phoenix, MS, CCC-SLP Admissions Coordinator [phone]

## 2022-09-08 NOTE — Progress Notes (Signed)
Patient ID: Charles Holmes, male   DOB: April 09, 1965, 57 y.o.   MRN: [id number]     Advanced Heart Failure Rounding Note  PCP-Cardiologist: Charles Herrlich, MD  [facility]: Dr. Gala Holmes   Subjective:    7/25: Admit with cardiogenic shock. Started milrinone and NE. 8/6 S/P 13 teeth extractions  8/9 IABP placed 8/13 S/p HM III LVAD implant + clipping left atrial appendage d/t severe thickening and invagination of mitral valve annulus impeding flows 8/14 Left-sided hemiplegia. CT head with acute R MCA infarct. IR for thrombectomy. CT head with small to moderate area of hemorrhagic conversion 8/15 extubated  8/17 Low dose heparin gtt restarted, CT head with decreased size of hemorrhagic stroke.  8/18 Neurology signed off 8/19: Limited echo: Mod RV dysfunction, better mitral inflow into the LV and VAD position satisfactory.  Post Op Day #10  Remains on 0.375 milrinone + 8 Epi.   Co-ox 68%. CVP 12  On empiric vancomycin/mero. WBC 13.8>16.1>23>26>21>24.6K. Cultures no growth  Feeling better today but didn't sleep much last night. PO intake improving.   LVAD Interrogation HM III: Speed: 5350 Flow: 4.5 PI: 3.3 Power: 3.8. No PI events today   Objective:   Weight Range: 80.8 kg Body mass index is 27.9 kg/m.   Vital Signs:   Temp:  [97.7 F (36.5 C)-100.8 F (38.2 C)] 99.3 F (37.4 C) (08/23 0400) Pulse Rate:  [98-222] 148 (08/23 0700) Resp:  [17-28] 22 (08/23 0700) BP: (74-104)/(49-93) 81/71 (08/23 0700) SpO2:  [89 %-98 %] 95 % (08/23 0700) Weight:  [80.8 kg] 80.8 kg (08/23 0500) Last BM Date : 09/07/22  Weight change: Filed Weights   09/06/22 0500 09/07/22 0500 09/08/22 0500  Weight: 81.1 kg 80.2 kg 80.8 kg    Intake/Output:   Intake/Output Summary (Last 24 hours) at 09/08/2022 0707 Last data filed at 09/08/2022 0700 Gross per 24 hour  Intake 3871.78 ml  Output 4950 ml  Net -1078.22 ml   Physical Exam  CVP 12 General:  Well appearing. No resp difficulty HEENT: Normal.  +cortrak Neck: supple. JVP 12. Carotids 2+ bilat; no bruits. No lymphadenopathy or thyromegaly appreciated. Cor: Mechanical heart sounds with LVAD hum present. Lungs: Clear Abdomen: soft, nontender, nondistended. No hepatosplenomegaly. No bruits or masses. Good bowel sounds. Driveline: C/D/I; securement device intact. Dressing around DL exit site Extremities: no cyanosis, clubbing, rash, +1 BLE edema. +UNNA boots. PICC RUE Neuro: alert & orientedx3, cranial nerves grossly intact. moves all 4 extremities w/o difficulty. Affect pleasant   Telemetry     ST with BiV pacing 110s 0-3 PVCs (personally reviewed)   Labs    CBC Recent Labs    09/07/22 0407 09/08/22 0408  WBC 21.0* 24.6*  HGB 8.6* 9.2*  HCT 27.9* 29.4*  MCV 100.4* 100.3*  PLT 435* 444*    Basic Metabolic Panel Recent Labs    57/84/69 0407 09/08/22 0408  NA 134* 131*  K 3.8 3.9  CL 93* 94*  CO2 27 32  GLUCOSE 213* 181*  BUN 28* 33*  CREATININE 0.75 0.95  CALCIUM 7.9* 7.7*  MG 1.6* 2.1    BNP: BNP (last 3 results) Recent Labs    08/10/22 1142 08/30/22 0258 09/04/22 2144  BNP 1,905.0* 922.3* 1,050.6*     Imaging    No results found.   Medications:     Scheduled Medications:  sodium chloride   Intravenous Once   aspirin  324 mg Per Tube Daily   atorvastatin  80 mg Per Tube Daily  Chlorhexidine Gluconate Cloth  6 each Topical Q0600   dorzolamide-timolol  1 drop Both Eyes BID   feeding supplement  237 mL Oral TID BM   feeding supplement (PROSource TF20)  60 mL Per Tube BID   feeding supplement (VITAL 1.5 CAL)  780 mL Per Tube Q24H   fiber supplement (BANATROL TF)  60 mL Per Tube BID   insulin aspart  0-24 Units Subcutaneous Q4H   latanoprost  1 drop Both Eyes QHS   melatonin  3 mg Oral QHS   mexiletine  250 mg Per Tube BID   mometasone-formoterol  2 puff Inhalation BID   multivitamin with minerals  1 tablet Per Tube Daily   mouth rinse  15 mL Mouth Rinse 4 times per day   oxymetazoline   1 spray Each Nare BID   pantoprazole (PROTONIX) IV  40 mg Intravenous QHS   polyethylene glycol  17 g Per Tube Daily   potassium chloride  20 mEq Per Tube BID   QUEtiapine  25 mg Oral QHS   sildenafil  20 mg Per Tube TID   sodium chloride flush  3 mL Intravenous Q12H   umeclidinium bromide  1 puff Inhalation Daily   warfarin  2 mg Oral q1600   Warfarin - Pharmacist Dosing Inpatient   Does not apply q1600    Infusions:  sodium chloride 10 mL/hr at 09/08/22 0700   epinephrine 8 mcg/min (09/08/22 0700)   heparin 500 Units/hr (09/08/22 0700)   meropenem (MERREM) IV Stopped (09/08/22 2841)   milrinone 0.375 mcg/kg/min (09/08/22 0700)   vancomycin Stopped (09/08/22 0018)    PRN Medications: sodium chloride, acetaminophen, albuterol, dextrose, ondansetron (ZOFRAN) IV, mouth rinse, oxyCODONE, sodium chloride, sodium chloride flush, traMADol   Patient Profile  [name] is a 57 y.o. 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 Admitted with cardiogenic shock, stabilized and underwent HM3 LVAD. Post implant course c/b acute CVA.   Assessment/Plan  1.  Acute on chronic Systolic HF-->Cardiogenic Shock  - Diagnosed 11/2019. Presented with VT. LHC 70% LAD  - cMRI 12/21 concerning for sarcoid and EF 18%.  - PET 2/22 at [facility] EF 25% + active sarcoid - Echo 08/26/20 EF < 20% severely dilated LV RV mildly decreased.  - Medtronic CRT-D upgrade in 06/08/21 - Echo 07/10/22: EF <20%, RV okay, mod pericardial effusion, mod MR/TR - Admitted 07/25 with cardiogenic shock. - RHC: Nonobstructive CAD, severely elevated filling pressures and low Fick CO/CI (2.7/1.4) - 08/13 HM III LVAD implant + clipping LAA d/t severe thickening and invagination of mitral valve annulus impeding flows.  - Apical core sent to pathology to confirm diagnosis of cardiac sarcoid >> pending - Speed increased to 5300 on 08/14. On milrinone 0.375 + Epi 5, LVAD flow 4.5.  LDH stable - Wean epi as tolerated. Continue milrinone.  - continue sildenafil, cut back to 20 mg tid to try to wean off pressor suppport.  - CVP 12, -5L UOP. Weight up 2lbs?. Diuresed yesterday with 80/40 IV lasix. Give 80 IV BID today.  - Continue UNNA boots - CT head 09/02/22 w/ improved hemorrhagic CVA, now back on low dose heparin per neurology.   - Warfarin restarted. INR 1 dosing per PharmD  2.  Acute stroke - Hx CVA 06/24 -Admitted 06/24 w/ R MCA stroke. S/p TPA and mechanical clot extraction. No residual deficits. Likely cardioembolic in setting of severe LV dysfunction. - Developed left sided  weakness 08/14. CTA with R MCA infarct. Taken to IR for thrombectomy - Repeat CT head with small to moderate size hemorrhagic conversion. CT head this am stable. - D/w neuro, repeat head CT on 8/17 w/ improved hemorrhagic CVA, now back on low dose heparin per neurology.  - Back on warfarin - neurology signed off 09/03/22.  - C/w PT/OT.  - Continue ASA 325  - Has Cor-Trak and still getting nocturnal tube feeds, intake improving. On dysphagia 1 diet. Dietitian following   - will need CIR once medically stable for d/c (team following)   3. 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 Continue mexiletine  - now s/p ICD.   4. CAD - LHC 12/07/19 70-% LAD, no intervention - LHC 8/24 non obstructive CAD.  - Continue statin. On aspirin for VAD.  5. Cardiac sarcoid - PET 2/22 at [facility] EF 25% + active sarcoid - Has completed prednisone.  - holding methotrexate w/ recent surgery  - Sent apical core to pathology to confirm diagnosis of cardiac sarcoid  6. 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 AKI - suspect cardiorenal, improved w/ inotropic support - Creatinine 0.95 today.  - follow BMP w/ diuresis    8. Iron deficiency anemia/ Post-op anemia - recent T sat 15%, scheduled for OP feraheme. Will complete inpatient  - Transfused 1 u RBCs 8/15 - Hgb 9.2  today.     9. Pulmonary  - PFTs with severe obstructive defect, response to bronchodilator. FEV1 1.04L, FEV1/FVC 48% - extubated 8/15  - Cultures NGTD.   10. Elevated LFTs -AST/ALT improved -T bili 4.6>>5.1>>4.2>>2.7>>1.8>1.9>1.2>1>1.5  11. ID - WBC 126>13.8>16.1>23>26>21>24.6K  - cont Merrem/vanc  - Cultures with NGTD - Incentive spirometer and flutter valve at bedside, encouraged to use  Continue to mobilize!   Length of Stay: 77  Alen Bleacher, NP  09/08/2022, 7:07 AM  VAD Team Pager [phone] (7am - 7am)   Advanced Heart Failure Team Pager [phone] (M-F; 7a - 5p)  Please contact [facility] for night-coverage after hours (5p -7a ) and weekends on [email or web address]

## 2022-09-08 NOTE — Progress Notes (Addendum)
ANTICOAGULATION CONSULT NOTE  Pharmacy Consult for heparin + Coumadin Indication:  LVAD  Allergies  Allergen Reactions   Amiodarone     Severe tremors   Percocet [Oxycodone-Acetaminophen] Itching    Patient Measurements: Height: 5\' 7"  (170.2 cm) Weight: 80.8 kg (178 lb 2.1 oz) IBW/kg (Calculated) : 66.1 Heparin Dosing Weight: 87kg  Vital Signs: Temp: 99.3 F (37.4 C) (08/23 0400) Temp Source: Oral (08/23 0400) BP: 81/71 (08/23 0700) Pulse Rate: 148 (08/23 0700)  Labs: Recent Labs    09/06/22 0359 09/06/22 0635 09/07/22 0407 09/07/22 0447 09/08/22 0408  HGB 8.6*  --  8.6*  --  9.2*  HCT 27.3*  --  27.9*  --  29.4*  PLT 389  --  435*  --  444*  APTT 166*   < > 88* 33 35  LABPROT 16.0*  --  15.7* 15.1 13.8  INR 1.3*  --  1.2 1.2 1.0  HEPARINUNFRC 0.67   < > 0.47 <0.10* <0.10*  CREATININE 1.03  --  0.75  --  0.95   < > = values in this interval not displayed.    Estimated Creatinine Clearance: 85.3 mL/min (by C-G formula based on SCr of 0.95 mg/dL).   Medical History: Past Medical History:  Diagnosis Date   CAD (coronary artery disease)    CHF (congestive heart failure) (HCC)    GERD (gastroesophageal reflux disease)    Hyperlipidemia    Hypertension    Systolic heart failure (HCC) 2021   LVEF 18%, RVEF 38% on cardiac MRI 12/19/2019. possible cardiac sarcoidosis.   Wide-complex tachycardia 2021   wears LifeVest     Assessment: 57yoM on apixaban PTA for hx AF admitted for LVAD workup. Pt s/p HM3 implant on 8/13 c/b acute CVA postop. Head CT 8/17 stable, ok to start low dose heparin per neuro.   Heparin level <0.1 as expected on heparin drip 500uts/hr - no titration planned. INR 1, CBC and LDH stable. Will increase warfarin dose slightly - diet is improving.  Goal of Therapy:  Heparin level <0.1 units/ml Monitor platelets by anticoagulation protocol: Yes   Plan:  Continue Heparin 500 units/h - no up titrations  Daily heparin level and CBC Coumadin  2.5mg  daily  Daily INR   Fredonia Highland, PharmD, BCPS, [facility] Clinical Pharmacist [phone] Please check AMION for all [facility] Pharmacy numbers 09/08/2022

## 2022-09-08 NOTE — Progress Notes (Signed)
Physical Therapy Treatment Patient Details Name: Charles Holmes MRN: [id number] DOB: [birth year] Today's Date: 09/08/2022   History of Present Illness Pt is 57 year old presented to [facility] on  08/10/22 for acute on chronic systolic heart failure. Pt had IABP placed on 08/25/22 for ongoing cardiogenic shock. Underwent placement of LVAD on 8/13. Developed lt sided weakness on 8/14. Rt MCA CVA. Underwent mechanical thrombectomy by IR. Extubated 8/15.  PMH - CAD, CHF, HLD, HTN, systolic HF, ICD implant, rt CVA.  LVAD placement and new stroke R MCA M1.    PT Comments  Pt admitted with above diagnosis. Pt was able to stand to Stedy x 2 for up to 1 1/2 min each time with min assist of 2.  Pt fatigues quickly and could not stand a 3rd time today.  Positioned in chair and performed some exercises. Will continue to follow acutely.  Pt currently with functional limitations due to the deficits listed below (see PT Problem List). Pt will benefit from acute skilled PT to increase their independence and safety with mobility to allow discharge.       If plan is discharge home, recommend the following:     Can travel by private vehicle        Equipment Recommendations  Other (comment) (To be determined)    Recommendations for Other Services       Precautions / Restrictions Precautions Precautions: Sternal;Fall Precaution Comments: LVAD Restrictions Weight Bearing Restrictions: Yes RUE Weight Bearing: Non weight bearing RLE Weight Bearing: Non weight bearing Other Position/Activity Restrictions: Sternal Precautions     Mobility  Bed Mobility Overal bed mobility: Needs Assistance Bed Mobility: Supine to Sit Rolling: Min assist Sidelying to sit: Mod assist Supine to sit: Mod assist     General bed mobility comments: cued for sternal precautions and building some LE momentum to assist coming up.  needing mod truncal assist    Transfers Overall transfer level: Needs assistance   Transfers: Sit  to/from Stand, Bed to chair/wheelchair/BSC Sit to Stand: +2 physical assistance, Mod assist, +2 safety/equipment (x4 from bed)           General transfer comment: Sit to stand x2 with pt able to stand to [facility] fully upright. Needs assist to move left UE to [facility] bar and assist to hold onto the bar.  Can stand up to 1 1/2 min each time. Moved pt in [place] to chair. Pt does fatigue quickly with pt leaning onto bar once fatigued. Transfer via Lift Equipment: Stedy  Ambulation/Gait                   Stairs             Wheelchair Mobility     Tilt Bed    Modified Rankin (Stroke Patients Only) Modified Rankin (Stroke Patients Only) Pre-Morbid Rankin Score: No symptoms Modified Rankin: Moderately severe disability     Balance Overall balance assessment: Needs assistance Sitting-balance support: Feet supported, Single extremity supported Sitting balance-Leahy Scale: Fair Sitting balance - Comments: Pt can sit using right UE for support and contact guard assist   Standing balance support: Single extremity supported, Bilateral upper extremity supported, During functional activity Standing balance-Leahy Scale: Poor Standing balance comment: reliant on external support and Stedy                            Cognition Arousal: Alert Behavior During Therapy: Restless Overall Cognitive Status: Impaired/Different from baseline (  NT formally) Area of Impairment: Problem solving, Awareness, Following commands, Safety/judgement, Memory                     Memory: Decreased recall of precautions (requires constant verbal/tactile cues to adhere to sternal precautions functionally) Following Commands: Follows one step commands consistently, Follows one step commands with increased time Safety/Judgement: Decreased awareness of safety, Decreased awareness of deficits Awareness: Emergent Problem Solving: Slow processing, Requires verbal cues, Difficulty  sequencing General Comments: requires maximal cues for precautions, poor delayed recall. very limited insight to deficits and safety.        Exercises General Exercises - Lower Extremity Ankle Circles/Pumps: AROM, Both, 10 reps, Supine Long Arc Quad: AROM, Left, 10 reps, Seated (with 3 sec hold at full extension)    General Comments General comments (skin integrity, edema, etc.): 94 bpm, 97% 3L, LVAD flow 4.4, Speed 5300, PI 3.6, power 3.9      Pertinent Vitals/Pain Pain Assessment Pain Assessment: Faces Faces Pain Scale: Hurts little more Pain Location: chest Pain Descriptors / Indicators: Guarding, Sore Pain Intervention(s): Limited activity within patient's tolerance, Monitored during session, Repositioned    Home Living   Living Arrangements: Alone   Type of Home: Mobile home Home Access: Stairs to enter Entrance Stairs-Rails: Doctor, general practice of Steps: 1   Home Layout: One level        Prior Function            PT Goals (current goals can now be found in the care plan section) Progress towards PT goals: Progressing toward goals    Frequency    Min 1X/week      PT Plan Current plan remains appropriate    Co-evaluation              AM-PAC PT "6 Clicks" Mobility   Outcome Measure  Help needed turning from your back to your side while in a flat bed without using bedrails?: A Lot Help needed moving from lying on your back to sitting on the side of a flat bed without using bedrails?: A Lot Help needed moving to and from a bed to a chair (including a wheelchair)?: Total Help needed standing up from a chair using your arms (e.g., wheelchair or bedside chair)?: Total Help needed to walk in hospital room?: Total Help needed climbing 3-5 steps with a railing? : Total 6 Click Score: 8    End of Session Equipment Utilized During Treatment: Oxygen;Gait belt Activity Tolerance: Patient limited by fatigue (l) Patient left: with call  bell/phone within reach;in chair;with chair alarm set Nurse Communication: Mobility status PT Visit Diagnosis: Other abnormalities of gait and mobility (R26.89);Difficulty in walking, not elsewhere classified (R26.2);Hemiplegia and hemiparesis Hemiplegia - Right/Left: Left Hemiplegia - dominant/non-dominant: Non-dominant Hemiplegia - caused by: Cerebral infarction     Time: 1341-1414 PT Time Calculation (min) (ACUTE ONLY): 33 min  Charges:    $Therapeutic Exercise: 8-22 mins $Therapeutic Activity: 8-22 mins PT General Charges $$ ACUTE PT VISIT: 1 Visit                     Charles Holmes,PT Acute Rehab Services [phone]    Charles Holmes 09/08/2022, 4:54 PM

## 2022-09-08 NOTE — Progress Notes (Signed)
SLP Cancellation Note  Patient Details Name: Charles Holmes MRN: [id number] DOB: Jun 05, 1965   Cancelled treatment:       Reason Eval/Treat Not Completed: Patient at procedure or test/unavailable. Intake still minimal, pt being cleaned up.    Read Bonelli, Riley Nearing 09/08/2022, 12:00 PM

## 2022-09-08 NOTE — Consult Note (Signed)
Physical Medicine and Rehabilitation Consult Reason for Consult:debility and left sided weakness Referring Physician: Bensimhon   HPI: Charles Holmes is a 57 y.o. male with a history of chronic systolic CHF who presented on 04/25/79 for with SOB and was found to be in cardiogenic shock. Pt was found to have multiple dental caries as well and 13 teeth extracted on 8/6. In the ICU an IABP was placed and ultimately the pt underwent LAVD placement on 8/13. On 8/14 pt was noted to have left sided weakness. CT demonstrated acute right MCA infarct. Pt had thrombectomy, some hemorrhagic conversion in area of stroke. Pt has remained in the CICU for ongoing medical mgt. He is currently on empiric merrem and vanc for leukocytosis and fever. He is still dealing with a lot of fatigue. I asked him how his therapy went yesterday and he told me it wore him out. With PT yesterday pt performed bed and sit-std transfers which were done with mod assist +2. He was able to stand for peri-care 20-25 seconds. He has not yet ambulated. PTA pt was independent in his mobile home in [place]. Nobody is with him at home, but he says he has a cousin (and his wife) with whom he can stay after discharge. Their home is on one level.   Review of Systems  Constitutional:  Positive for malaise/fatigue. Negative for fever.  HENT: Negative.    Eyes: Negative.   Respiratory:  Positive for cough, shortness of breath and wheezing.   Cardiovascular:  Positive for leg swelling.  Gastrointestinal:  Positive for nausea. Negative for vomiting.  Genitourinary:  Negative for dysuria.  Musculoskeletal:  Positive for myalgias.  Neurological:  Positive for sensory change, speech change, focal weakness and weakness.  Psychiatric/Behavioral:  Negative for suicidal ideas.    Past Medical History:  Diagnosis Date   CAD (coronary artery disease)    CHF (congestive heart failure) (HCC)    GERD (gastroesophageal reflux disease)     Hyperlipidemia    Hypertension    Systolic heart failure (HCC) 2021   LVEF 18%, RVEF 38% on cardiac MRI 12/19/2019. possible cardiac sarcoidosis.   Wide-complex tachycardia 2021   wears LifeVest   Past Surgical History:  Procedure Laterality Date   BIV UPGRADE N/A 06/07/2021   Procedure: BIV ICD UPGRADE;  Surgeon: Regan Lemming, MD;  Location: [facility] INVASIVE CV LAB;  Service: Cardiovascular;  Laterality: N/A;   CLIPPING OF ATRIAL APPENDAGE Left 08/29/2022   Procedure: CLIPPING OF ATRIAL APPENDAGE;  Surgeon: Lovett Sox, MD;  Location: [facility] OR;  Service: Open Heart Surgery;  Laterality: Left;   IABP INSERTION N/A 08/25/2022   Procedure: IABP Insertion;  Surgeon: Dolores Patty, MD;  Location: [facility] INVASIVE CV LAB;  Service: Cardiovascular;  Laterality: N/A;   ICD IMPLANT N/A 02/20/2020   Procedure: ICD IMPLANT;  Surgeon: Regan Lemming, MD;  Location: [facility] INVASIVE CV LAB;  Service: Cardiovascular;  Laterality: N/A;   INSERTION OF IMPLANTABLE LEFT VENTRICULAR ASSIST DEVICE N/A 08/29/2022   Procedure: INSERTION OF IMPLANTABLE LEFT VENTRICULAR ASSIST DEVICE;  Surgeon: Lovett Sox, MD;  Location: [facility] OR;  Service: Open Heart Surgery;  Laterality: N/A;   IR CT HEAD LTD  07/10/2022   IR CT HEAD LTD  08/30/2022   IR PERCUTANEOUS ART THROMBECTOMY/INFUSION INTRACRANIAL INC DIAG ANGIO  07/10/2022   IR PERCUTANEOUS ART THROMBECTOMY/INFUSION INTRACRANIAL INC DIAG ANGIO  08/30/2022   IR US GUIDE VASC ACCESS LEFT  08/30/2022   IR US GUIDE  VASC ACCESS RIGHT  07/10/2022   RADIOLOGY WITH ANESTHESIA N/A 07/10/2022   Procedure: RADIOLOGY WITH ANESTHESIA;  Surgeon: Radiologist, Medication, MD;  Location: [facility] OR;  Service: Radiology;  Laterality: N/A;   RADIOLOGY WITH ANESTHESIA N/A 08/30/2022   Procedure: IR WITH ANESTHESIA;  Surgeon: Julieanne Cotton, MD;  Location: [facility] OR;  Service: Radiology;  Laterality: N/A;   REMOVAL OF IMPELLA LEFT VENTRICULAR ASSIST DEVICE N/A 08/29/2022   Procedure: REMOVAL OF  INTRA-AORTIC BALLON PUMP;  Surgeon: Lovett Sox, MD;  Location: [facility] OR;  Service: Open Heart Surgery;  Laterality: N/A;   RIGHT HEART CATH N/A 07/14/2022   Procedure: RIGHT HEART CATH;  Surgeon: Dolores Patty, MD;  Location: [facility] INVASIVE CV LAB;  Service: Cardiovascular;  Laterality: N/A;   RIGHT HEART CATH N/A 08/25/2022   Procedure: RIGHT HEART CATH;  Surgeon: Dolores Patty, MD;  Location: [facility] INVASIVE CV LAB;  Service: Cardiovascular;  Laterality: N/A;   RIGHT/LEFT HEART CATH AND CORONARY ANGIOGRAPHY N/A 12/16/2019   Procedure: RIGHT/LEFT HEART CATH AND CORONARY ANGIOGRAPHY;  Surgeon: Swaziland, Peter M, MD;  Location: [facility] INVASIVE CV LAB;  Service: Cardiovascular;  Laterality: N/A;   RIGHT/LEFT HEART CATH AND CORONARY ANGIOGRAPHY N/A 08/10/2022   Procedure: RIGHT/LEFT HEART CATH AND CORONARY ANGIOGRAPHY;  Surgeon: Dolores Patty, MD;  Location: [facility] INVASIVE CV LAB;  Service: Cardiovascular;  Laterality: N/A;   TEE WITHOUT CARDIOVERSION N/A 08/29/2022   Procedure: TRANSESOPHAGEAL ECHOCARDIOGRAM;  Surgeon: Lovett Sox, MD;  Location: [facility] OR;  Service: Open Heart Surgery;  Laterality: N/A;   TOOTH EXTRACTION N/A 08/22/2022   Procedure: DENTAL RESTORATION/EXTRACTIONS;  Surgeon: Ocie Doyne, DMD;  Location: [facility] OR;  Service: Oral Surgery;  Laterality: N/A;   Family History  Problem Relation Age of Onset   Hypertension Father    Heart disease Father    Heart disease Mother    Social History:  reports that he has quit smoking. His smoking use included cigarettes. He has never used smokeless tobacco. He reports current alcohol use of about 6.0 standard drinks of alcohol per week. He reports current drug use. Drug: Marijuana. Allergies:  Allergies  Allergen Reactions   Amiodarone     Severe tremors   Percocet [Oxycodone-Acetaminophen] Itching   Medications Prior to Admission  Medication Sig Dispense Refill   apixaban (ELIQUIS) 5 MG TABS tablet Take 1 tablet (5 mg total) by mouth 2  (two) times daily. 180 tablet 3   atorvastatin (LIPITOR) 80 MG tablet Take 1 tablet (80 mg total) by mouth daily. 90 tablet 3   carvedilol (COREG) 3.125 MG tablet Take 1 tablet (3.125 mg total) by mouth 2 (two) times daily. 180 tablet 3   dapagliflozin propanediol (FARXIGA) 10 MG TABS tablet Take 1 tablet (10 mg total) by mouth daily before breakfast. 90 tablet 3   dorzolamide-timolol (COSOPT) 22.3-6.8 MG/ML ophthalmic solution Place 1 drop into both eyes in the morning and at bedtime.     fluticasone-salmeterol (ADVAIR HFA) 230-21 MCG/ACT inhaler Inhale 2 puffs into the lungs 2 (two) times daily. 1 each 12   folic acid (FOLVITE) 1 MG tablet Take 1 tablet (1 mg total) by mouth daily. 90 tablet 3   furosemide (LASIX) 40 MG tablet Take 1 tablet (40 mg total) by mouth 2 (two) times daily. 90 tablet 3   latanoprost (XALATAN) 0.005 % ophthalmic solution Place 1 drop into both eyes at bedtime.     methotrexate (RHEUMATREX) 2.5 MG tablet Take 8 tablets (20 mg total) by mouth once a week. (  Patient taking differently: Take 20 mg by mouth once a week. Saturdays) 96 tablet 3   mexiletine (MEXITIL) 250 MG capsule Take 1 capsule (250 mg total) by mouth 2 (two) times daily. 180 capsule 3   pantoprazole (PROTONIX) 40 MG tablet Take 40 mg by mouth daily before breakfast.      Home: Home Living Family/patient expects to be discharged to:: Private residence Living Arrangements: Alone Available Help at Discharge: Available PRN/intermittently Type of Home: Mobile home Home Access: Stairs to enter [employer] of Steps: 2 Entrance Stairs-Rails: Right, Left Home Layout: One level Bathroom Shower/Tub: Health visitor: Standard Bathroom Accessibility: Yes Home Equipment: None Additional Comments: If pt has LVAD he will go stay with cousins initially in 1 level home.  Functional History: Prior Function Prior Level of Function : Independent/Modified Independent, Driving ADLs Comments:  does cooking / cleaning Functional Status:  Mobility: Bed Mobility Overal bed mobility: Needs Assistance Bed Mobility: Supine to Sit Rolling: Min assist Sidelying to sit: Mod assist Supine to sit: Mod assist Sit to supine: +2 for physical assistance, Max assist General bed mobility comments: cued for sternal precautions and building some LE momentum to assist coming up.  needing mod truncal assist Transfers Overall transfer level: Needs assistance Equipment used: 2 person hand held assist, 1 person hand held assist (face to face transfer with L knee blocked) Transfers: Sit to/from Stand, Bed to chair/wheelchair/BSC Sit to Stand: +2 physical assistance, Mod assist, +2 safety/equipment (x4 from bed and BSC) Bed to/from chair/wheelchair/BSC transfer type:: Step pivot Step pivot transfers: Max assist, +2 physical assistance General transfer comment: Sit to stand x4 overall.  1 from lowered bed, 3 from BSC height.  Stability and RW assist for step pivot from bed to the [facility].  standing at [facility] x3 for ~ 20 to 25 sec  for peri care x3 Ambulation/Gait Ambulation/Gait assistance: Supervision Gait Distance (Feet): 670 Feet Assistive device: None Gait Pattern/deviations: Decreased stride length, Step-through pattern General Gait Details: limited to side steps along HOB Gait velocity: mildly decreased Gait velocity interpretation: >2.62 ft/sec, indicative of community ambulatory Pre-gait activities: w/shifting and low amplitude steps in place during 1 of the standing trials.    ADL: ADL Overall ADL's : Needs assistance/impaired Eating/Feeding: Set up, Bed level Grooming: Minimal assistance, Bed level, Sitting Upper Body Bathing: Moderate assistance, Bed level Lower Body Bathing: Maximal assistance Upper Body Dressing : Moderate assistance Lower Body Dressing: Maximal assistance Lower Body Dressing Details (indicate cue type and reason): donning socks at bed level Toilet Transfer: Moderate  assistance, Tax adviser Details (indicate cue type and reason): simulated from bed>chair Toileting- Clothing Manipulation and Hygiene: Total assistance, Sit to/from stand, +2 for safety/equipment Toileting - Clothing Manipulation Details (indicate cue type and reason): total A in standing for rear peri care Functional mobility during ADLs: Moderate assistance General ADL Comments: very limited standing tolerance  Cognition: Cognition Overall Cognitive Status: Impaired/Different from baseline (NT formally) Orientation Level: Oriented X4 Cognition Arousal: Alert Behavior During Therapy: Restless Overall Cognitive Status: Impaired/Different from baseline (NT formally) Area of Impairment: Problem solving, Awareness, Following commands, Safety/judgement, Memory Memory: Decreased recall of precautions (requires constant verbal/tactile cues to adhere to sternal precautions functionally) Following Commands: Follows one step commands consistently, Follows one step commands with increased time Safety/Judgement: Decreased awareness of safety, Decreased awareness of deficits Awareness: Emergent Problem Solving: Slow processing, Requires verbal cues, Difficulty sequencing General Comments: requires maximal cues for precautions, poor delayed recall. very limited insight to deficits and safety.  Blood pressure (!) 74/56, pulse (!) 122, temperature 99.3 F (37.4 C), temperature source Oral, resp. rate 19, height 5\' 7"  (1.702 m), weight 80.8 kg, SpO2 (!) 89%. Physical Exam Constitutional:      General: He is not in acute distress.    Appearance: He is obese. He is ill-appearing.  HENT:     Head: Normocephalic.     Nose: Nose normal.     Comments: O2 [place],NGT    Mouth/Throat:     Comments: edentulous Eyes:     Extraocular Movements: Extraocular movements intact.     Conjunctiva/sclera: Conjunctivae normal.     Pupils: Pupils are equal, round, and reactive to light.  Cardiovascular:      Comments: LVAD hum Pulmonary:     Effort: Pulmonary effort is normal.  Abdominal:     General: There is no distension.  Musculoskeletal:        General: Normal range of motion.     Cervical back: Normal range of motion.     Comments: Unnas in place bilateral LE. No edema present today  Skin:    General: Skin is warm.     Comments: Scattered bruises and ecchymoses. LVAD site not inspected  Neurological:     Comments: Pt is fairly alert. Oriented to month/year/place/name, why he's here. Reasonable insight and awareness. Speech slightly slurred. Left central 7. Seems to track to all fields without gross visual field loss noted. LUE grossly 2/5 prox to distal. LLE 2+ to 3-/5 prox to distal. Decreased LT and pain in left arm and leg. No abnl resting tone. DTR's 1+  Psychiatric:     Comments: Pt a little flat but generally pleasant and cooperative     Results for orders placed or performed during the hospital encounter of 08/10/22 (from the past 24 hour(s))  Glucose, capillary     Status: Abnormal   Collection Time: 09/07/22  3:58 PM  Result Value Ref Range   Glucose-Capillary 129 (H) 70 - 99 mg/dL  Glucose, capillary     Status: Abnormal   Collection Time: 09/07/22  7:37 PM  Result Value Ref Range   Glucose-Capillary 157 (H) 70 - 99 mg/dL  Glucose, capillary     Status: Abnormal   Collection Time: 09/07/22 11:14 PM  Result Value Ref Range   Glucose-Capillary 175 (H) 70 - 99 mg/dL  Glucose, capillary     Status: Abnormal   Collection Time: 09/08/22  3:17 AM  Result Value Ref Range   Glucose-Capillary 182 (H) 70 - 99 mg/dL  Lactate dehydrogenase     Status: Abnormal   Collection Time: 09/08/22  4:08 AM  Result Value Ref Range   LDH 350 (H) 98 - 192 U/L  Protime-INR     Status: None   Collection Time: 09/08/22  4:08 AM  Result Value Ref Range   Prothrombin Time 13.8 11.4 - 15.2 seconds   INR 1.0 0.8 - 1.2  APTT     Status: None   Collection Time: 09/08/22  4:08 AM  Result  Value Ref Range   aPTT 35 24 - 36 seconds  Heparin level (unfractionated)     Status: Abnormal   Collection Time: 09/08/22  4:08 AM  Result Value Ref Range   Heparin Unfractionated <0.10 (L) 0.30 - 0.70 IU/mL  Cooxemetry Panel (carboxy, met, total hgb, O2 sat)     Status: Abnormal   Collection Time: 09/08/22  4:08 AM  Result Value Ref Range   Total hemoglobin 9.6 (L) 12.0 -  16.0 g/dL   O2 Saturation 71.6 %   Carboxyhemoglobin 2.5 (H) 0.5 - 1.5 %   Methemoglobin 1.0 0.0 - 1.5 %  CBC     Status: Abnormal   Collection Time: 09/08/22  4:08 AM  Result Value Ref Range   WBC 24.6 (H) 4.0 - 10.5 K/uL   RBC 2.93 (L) 4.22 - 5.81 MIL/uL   Hemoglobin 9.2 (L) 13.0 - 17.0 g/dL   HCT 96.7 (L) 89.3 - 81.0 %   MCV 100.3 (H) 80.0 - 100.0 fL   MCH 31.4 26.0 - 34.0 pg   MCHC 31.3 30.0 - 36.0 g/dL   RDW 17.5 (H) 10.2 - 58.5 %   Platelets 444 (H) 150 - 400 K/uL   nRBC 0.0 0.0 - 0.2 %  Comprehensive metabolic panel     Status: Abnormal   Collection Time: 09/08/22  4:08 AM  Result Value Ref Range   Sodium 131 (L) 135 - 145 mmol/L   Potassium 3.9 3.5 - 5.1 mmol/L   Chloride 94 (L) 98 - 111 mmol/L   CO2 32 22 - 32 mmol/L   Glucose, Bld 181 (H) 70 - 99 mg/dL   BUN 33 (H) 6 - 20 mg/dL   Creatinine, Ser 2.77 0.61 - 1.24 mg/dL   Calcium 7.7 (L) 8.9 - 10.3 mg/dL   Total Protein 5.4 (L) 6.5 - 8.1 g/dL   Albumin 1.8 (L) 3.5 - 5.0 g/dL   AST 824 (H) 15 - 41 U/L   ALT 103 (H) 0 - 44 U/L   Alkaline Phosphatase 174 (H) 38 - 126 U/L   Total Bilirubin 1.5 (H) 0.3 - 1.2 mg/dL   GFR, Estimated >23 >53 mL/min   Anion gap 5 5 - 15  Magnesium     Status: None   Collection Time: 09/08/22  4:08 AM  Result Value Ref Range   Magnesium 2.1 1.7 - 2.4 mg/dL  CG4 I-STAT (Lactic acid)     Status: None   Collection Time: 09/08/22  4:20 AM  Result Value Ref Range   Lactic Acid, Venous 1.0 0.5 - 1.9 mmol/L  Glucose, capillary     Status: Abnormal   Collection Time: 09/08/22  7:25 AM  Result Value Ref Range    Glucose-Capillary 154 (H) 70 - 99 mg/dL  Gamma GT     Status: Abnormal   Collection Time: 09/08/22  9:29 AM  Result Value Ref Range   GGT 216 (H) 7 - 50 U/L  Vancomycin, random     Status: None   Collection Time: 09/08/22 10:35 AM  Result Value Ref Range   Vancomycin Rm 13 ug/mL  Glucose, capillary     Status: Abnormal   Collection Time: 09/08/22 11:02 AM  Result Value Ref Range   Glucose-Capillary 127 (H) 70 - 99 mg/dL   DG Chest Port 1 View  Result Date: 09/07/2022 CLINICAL DATA:  LVAD EXAM: PORTABLE CHEST 1 VIEW COMPARISON:  09/06/2022. FINDINGS: Enlarged cardiac silhouette. Mild vascular congestion. Left-sided small pleural effusion. No pneumothorax. There is a right-sided PICC tip overlies mid SVC. Left-sided pacer and left ventricular cyst devices. Left atrial clip. Median sternotomy wires. Feeding tube tip below the diaphragm and off the x-ray. IMPRESSION: Enlarged cardiac silhouette. Mild vascular congestion. Small left-sided pleural effusion. Electronically Signed   By: Layla Maw M.D.   On: 09/07/2022 10:14    Assessment/Plan: Diagnosis: 57 yo male with debility and left sided weakness related acute on chronic heart failure/LVAD placement as well as post-op  right MCA infarct Does the need for close, 24 hr/day medical supervision in concert with the patient's rehab needs make it unreasonable for this patient to be served in a less intensive setting? Yes Co-Morbidities requiring supervision/potential complications:  -acute on chronic systolic congestive heart failure -leukocytosis/ID considerations -hx of sarcoid hrt disease -paroxysmal a fib -acute kidney injury -anemia  Due to bladder management, bowel management, safety, skin/wound care, disease management, medication administration, pain management, and patient education, does the patient require 24 hr/day rehab nursing? Potentially Does the patient require coordinated care of a physician, rehab nurse, therapy  disciplines of PT, OT, SLP to address physical and functional deficits in the context of the above medical diagnosis(es)? Yes Addressing deficits in the following areas: balance, endurance, locomotion, strength, transferring, bowel/bladder control, bathing, dressing, feeding, grooming, toileting, speech, swallowing, and psychosocial support Can the patient actively participate in an intensive therapy program of at least 3 hrs of therapy per day at least 5 days per week? Potentially The potential for patient to make measurable gains while on inpatient rehab is good Anticipated functional outcomes upon discharge from inpatient rehab are supervision and min assist  with PT, supervision and min assist with OT, modified independent and supervision with SLP. Estimated rehab length of stay to reach the above functional goals is: potentially 18-24 days Anticipated discharge destination: Home Overall Rehab/Functional Prognosis: good  POST ACUTE RECOMMENDATIONS: This patient's condition is appropriate for continued rehabilitative care in the following setting: CIR Patient has agreed to participate in recommended program. Yes Note that insurance prior authorization may be required for reimbursement for recommended care.  Comment: Pt still has active medical issues requiring ICU care. Also, activity tolerance is very limited. Will need to see improving stamina with therapies. Additionally, we are going to need to confirm that cousin can help and provide the amount of help he's probably going to need at home after discharge. Rehab Admissions Coordinator to follow up.     MEDICAL RECOMMENDATIONS: Pt will need ramping up of his acute therapy intensity to see if he is going to ultimately tolerate inpatient rehab.    I have personally performed a face to face diagnostic evaluation of this patient. Additionally, I have examined the patient's medical record including any pertinent labs and radiographic images. If  the physician assistant has documented in this note, I have reviewed and edited or otherwise concur with the physician assistant's documentation.  Thanks,  Ranelle Oyster, MD 09/08/2022

## 2022-09-08 NOTE — Progress Notes (Addendum)
Pharmacy Antibiotic Note  Charles Holmes is a 57 y.o. male admitted on 08/10/2022 now s/p LVAD placement 8/13 with fever and leukocytosis. Pharmacy has been consulted for vancomycin and cefepime dosing.   Previous vancomycin AUC elevated with VT of 15 mcg/ml, VT today is 13 mcg/ml and likely reflective of a therapeutic AUC (will defer VP). Cr is stable.  Plan: Continue vancomycin 750mg  IV q12h Continue meropenem 2g IV q8h  Height: 5\' 7"  (170.2 cm) Weight: 80.8 kg (178 lb 2.1 oz) IBW/kg (Calculated) : 66.1  Temp (24hrs), Avg:99.1 F (37.3 C), Min:98 F (36.7 C), Max:100.8 F (38.2 C)  Recent Labs  Lab 09/04/22 1233 09/04/22 2144 09/05/22 0348 09/05/22 1658 09/05/22 2023 09/05/22 2217 09/06/22 0153 09/06/22 0359 09/06/22 2536 09/07/22 0407 09/07/22 0415 09/08/22 0408 09/08/22 0420 09/08/22 1035  WBC  --   --  23.0*  --   --  29.0*  --  26.6*  --  21.0*  --  24.6*  --   --   CREATININE  --   --  0.73  --   --  0.72  --  1.03  --  0.75  --  0.95  --   --   LATICACIDVEN  --   --   --    < > 1.5  --  0.9  --  0.8  --  1.5  --  1.0  --   VANCOTROUGH  --  15  --   --   --   --   --   --   --   --   --   --   --   --   VANCOPEAK 34  --   --   --   --   --   --   --   --   --   --   --   --   --   VANCORANDOM  --   --   --   --   --   --   --   --   --   --   --   --   --  13   < > = values in this interval not displayed.    Estimated Creatinine Clearance: 85.3 mL/min (by C-G formula based on SCr of 0.95 mg/dL).    Allergies  Allergen Reactions   Amiodarone     Severe tremors   Percocet [Oxycodone-Acetaminophen] Itching   Restart vanc 8/16> Cefepime 8/16>8/20 Meropenem 8/20 >   8/16 Bcx > NGTD 8/16 sputum > NGTD 8/16 UCx > neg  Fredonia Highland, PharmD, [place], [facility] Clinical Pharmacist [phone] Please check AMION for all [facility] Pharmacy numbers 09/08/2022

## 2022-09-08 NOTE — PMR Pre-admission (Signed)
PMR Admission Coordinator Pre-Admission Assessment  Patient: Charles Holmes is an 57 y.o., male MRN: [id number] DOB: 1965-02-17 Height: 5\' 7"  (170.2 cm) Weight: 81.9 kg              Insurance Information HMO: yes    PPO:      PCP:      IPA:      80/20:      OTHER:  PRIMARY: UHC Medicare      Policy#: [id number]      Subscriber: patient CM Name: ***      Phone#: [phone] option #3     Fax#: [phone] Pre-Cert#: [id number]      Employer:  Benefits:  Phone #: [phone]     Name: 9/10 Eff. Date: 07/17/22     Deduct: none      Out of Pocket Max: [id number]      Life Max: None CIR:  $295 co pay per day days 1 until 5     SNF: no copay per day days 1 until 20; $203 co pay per day days 21 until 100 Outpatient: $20 co pay per visit     Co-Pay: visits per medical neccesity Home Health: 100%      Co-Pay: visits per medical neccesity DME: 80%     Co-Pay: 20% Providers: in-network  SECONDARY: Medicaid of       Policy#: [id number]      Phone#: [phone] 9/10 not currently active  Financial Counselor:       Phone#:   The "Data Collection Information Summary" for patients in Inpatient Rehabilitation Facilities with attached "Privacy Act Statement-Health Care Records" was provided and verbally reviewed with: Patient and Family  Emergency Contact Information Contact Information     Name Relation Home Work [place] Daughter [phone]  [phone]      Other Contacts     Name Relation Home Work Mobile   Downey Cousin   [phone]      Current Medical History  Patient Admitting Diagnosis: CVA, New LVAD  History of Present Illness: Pt is a 57 y/o male with a history of chronic systolic CHF, CAD, HLD, HTN, ICD implant, right CVA.Marland Kitchen Pt presented to [facility] on 08/10/22 for with SOB and was found to be in marked volume overload and class IV HF with low output. Taken same day to cath lab. Angio showed mild nonobstructive CAD. Severe NICM with severe  elevated filling pressures and low output consistent with cardiogenic shock. Began Milrinone and NE support. Diuresed. CT surgery consulted for LVAD candidacy. Pulmonary consulted for pre VAD optimization. Oral surgery consulted for tooth extractions. Underwent LVAD implant on 8/13 and clipping left atrial appendage due to severe thickening and invagination of mitral valve annulus impeding flows.   Postoperative found with left sided hemiplegia. CT imaging revealed acute right MCA infarct. Taken to IR for thrombectomy. Follow up CT with small to moderate area of hemorrhagic conversion. Placed back on Wafarin and Asa.   Postop course further complicated with enterococcus faecalis bacteremia on 9/6 with ID consulted. Recommend ampicillin and ceftriaxone  for 6 weeks an long term suppression with amoxicillin.   PICC removed.     TComplete NIHSS TOTAL: 4 Glasgow Coma Scale Score: 15  Patient's medical record from [facility] has been reviewed by the rehabilitation admission coordinator and physician.  Past Medical History  Past Medical History:  Diagnosis Date   CAD (coronary artery disease)    CHF (congestive heart failure) (HCC)  GERD (gastroesophageal reflux disease)    Hyperlipidemia    Hypertension    Systolic heart failure (HCC) 2021   LVEF 18%, RVEF 38% on cardiac MRI 12/19/2019. possible cardiac sarcoidosis.   Wide-complex tachycardia 2021   wears LifeVest   Has the patient had major surgery during 100 days prior to admission? Yes  Family History  family history includes Heart disease in his father and mother; Hypertension in his father.  Current Medications   Current Facility-Administered Medications:    0.9 %  sodium chloride infusion (Manually program via Guardrails IV Fluids), , Intravenous, Once, Andrey Farmer, PA-C   0.9 %  sodium chloride infusion, 250 mL, Intravenous, PRN, Lovett Sox, MD, Stopped at 09/13/22 2035   acetaminophen (TYLENOL) 160  MG/5ML solution 650 mg, 650 mg, Oral, Q4H PRN, Bensimhon, Bevelyn Buckles, MD, 650 mg at 09/22/22 2024   albuterol (PROVENTIL) (2.5 MG/3ML) 0.083% nebulizer solution 2.5 mg, 2.5 mg, Nebulization, Q4H PRN, Lovett Sox, MD   ampicillin (OMNIPEN) 2 g in sodium chloride 0.9 % 100 mL IVPB, 2 g, Intravenous, Q4H, Bensimhon, Bevelyn Buckles, MD, Last Rate: 300 mL/hr at 09/26/22 1406, 2 g at 09/26/22 1406   aspirin EC tablet 81 mg, 81 mg, Oral, Daily, Bensimhon, Bevelyn Buckles, MD, 81 mg at 09/26/22 1001   atorvastatin (LIPITOR) tablet 80 mg, 80 mg, Oral, Daily, Bensimhon, Bevelyn Buckles, MD, 80 mg at 09/26/22 1000   cefTRIAXone (ROCEPHIN) 2 g in sodium chloride 0.9 % 100 mL IVPB, 2 g, Intravenous, Q12H, Bensimhon, Bevelyn Buckles, MD, Last Rate: 200 mL/hr at 09/26/22 1249, 2 g at 09/26/22 1249   Chlorhexidine Gluconate Cloth 2 % PADS 6 each, 6 each, Topical, Q0600, Bensimhon, Bevelyn Buckles, MD, 6 each at 09/26/22 0654   dextrose 50 % solution 0-50 mL, 0-50 mL, Intravenous, PRN, Lovett Sox, MD, 50 mL at 09/23/22 0620   digoxin (LANOXIN) tablet 0.125 mg, 0.125 mg, Oral, Daily, Laurey Morale, MD, 0.125 mg at 09/26/22 1000   dorzolamide-timolol (COSOPT) 2-0.5 % ophthalmic solution 1 drop, 1 drop, Both Eyes, BID, Lovett Sox, MD, 1 drop at 09/26/22 1001   dronabinol (MARINOL) capsule 2.5 mg, 2.5 mg, Oral, BID AC, Laurey Morale, MD, 2.5 mg at 09/26/22 1244   feeding supplement (ENSURE ENLIVE / ENSURE PLUS) liquid 237 mL, 237 mL, Oral, TID BM, Bensimhon, Bevelyn Buckles, MD, 237 mL at 09/26/22 1448   Gerhardt's butt cream, , Topical, TID, Brynda Peon L, NP, Given at 09/26/22 1001   insulin aspart (novoLOG) injection 0-24 Units, 0-24 Units, Subcutaneous, TID WC & HS, Lovett Sox, MD, 2 Units at 09/26/22 1245   latanoprost (XALATAN) 0.005 % ophthalmic solution 1 drop, 1 drop, Both Eyes, QHS, Vantrigt, Peter, MD, 1 drop at 09/25/22 2249   melatonin tablet 3 mg, 3 mg, Oral, QHS, Sabharwal, Aditya, DO, 3 mg at 09/25/22 2225   mexiletine  (MEXITIL) capsule 250 mg, 250 mg, Oral, BID, Bensimhon, Bevelyn Buckles, MD, 250 mg at 09/26/22 1000   midodrine (PROAMATINE) tablet 5 mg, 5 mg, Oral, Q8H, Andrey Farmer, PA-C, 5 mg at 09/26/22 1447   mirtazapine (REMERON SOL-TAB) disintegrating tablet 7.5 mg, 7.5 mg, Oral, QHS, Lovett Sox, MD, 7.5 mg at 09/25/22 2225   mometasone-formoterol (DULERA) 200-5 MCG/ACT inhaler 2 puff, 2 puff, Inhalation, BID, Lovett Sox, MD, 2 puff at 09/26/22 0747   multivitamin with minerals tablet 1 tablet, 1 tablet, Oral, Daily, Bensimhon, Bevelyn Buckles, MD, 1 tablet at 09/26/22 1001   ondansetron (ZOFRAN) injection 4 mg, 4  mg, Intravenous, Q6H PRN, Lovett Sox, MD   Oral care mouth rinse, 15 mL, Mouth Rinse, 4 times per day, Bensimhon, Bevelyn Buckles, MD, 15 mL at 09/26/22 1001   Oral care mouth rinse, 15 mL, Mouth Rinse, PRN, Bensimhon, Bevelyn Buckles, MD   oxyCODONE (Oxy IR/ROXICODONE) immediate release tablet 5 mg, 5 mg, Oral, Q4H PRN, Bensimhon, Bevelyn Buckles, MD, 5 mg at 09/07/22 1652   pantoprazole (PROTONIX) EC tablet 40 mg, 40 mg, Oral, Daily, Bensimhon, Bevelyn Buckles, MD, 40 mg at 09/25/22 2226   polyethylene glycol (MIRALAX / GLYCOLAX) packet 17 g, 17 g, Oral, Daily PRN, Bensimhon, Bevelyn Buckles, MD, 17 g at 09/23/22 4540   [COMPLETED] sertraline (ZOLOFT) tablet 25 mg, 25 mg, Oral, Daily, 25 mg at 09/18/22 0943 **FOLLOWED BY** sertraline (ZOLOFT) tablet 50 mg, 50 mg, Oral, Daily, Bensimhon, Bevelyn Buckles, MD, 50 mg at 09/26/22 1001   sildenafil (REVATIO) tablet 20 mg, 20 mg, Oral, TID, Bensimhon, Bevelyn Buckles, MD, 20 mg at 09/26/22 1001   sodium chloride (OCEAN) 0.65 % nasal spray 2 spray, 2 spray, Each Nare, PRN, Bensimhon, Bevelyn Buckles, MD   sodium chloride flush (NS) 0.9 % injection 3 mL, 3 mL, Intravenous, Q12H, Vantrigt, Peter, MD, 3 mL at 09/26/22 0910   sodium chloride flush (NS) 0.9 % injection 3 mL, 3 mL, Intravenous, PRN, Lovett Sox, MD   traMADol Janean Sark) tablet 50-100 mg, 50-100 mg, Oral, Q4H PRN, Lovett Sox, MD,  100 mg at 09/04/22 1459   umeclidinium bromide (INCRUSE ELLIPTA) 62.5 MCG/ACT 1 puff, 1 puff, Inhalation, Daily, Lovett Sox, MD, 1 puff at 09/26/22 0747   warfarin (COUMADIN) tablet 0.5 mg, 0.5 mg, Oral, ONCE-1600, Hurth, Sherrye Payor, RPH   Warfarin - Pharmacist Dosing Inpatient, , Does not apply, q1600, Lovett Sox, MD, Given at 09/25/22 1652  Patients Current Diet:  Diet Order             DIET DYS 2 Room service appropriate? Yes; Fluid consistency: Thin  Diet effective now                  Precautions / Restrictions Precautions Precautions: Sternal, Fall Precaution Comments: LVAD Restrictions Weight Bearing Restrictions: No RUE Weight Bearing: Non weight bearing RLE Weight Bearing: Non weight bearing Other Position/Activity Restrictions: Sternal Precautions   Has the patient had 2 or more falls or a fall with injury in the past year?No  Prior Activity Level Community (5-7x/wk): drives, gets out of house often  Prior Functional Level Prior Function Prior Level of Function : Independent/Modified Independent, Driving ADLs Comments: does cooking / cleaning  Self Care: Did the patient need help bathing, dressing, using the toilet or eating?  Independent  Indoor Mobility: Did the patient need assistance with walking from room to room (with or without device)? Independent  Stairs: Did the patient need assistance with internal or external stairs (with or without device)? Independent  Functional Cognition: Did the patient need help planning regular tasks such as shopping or remembering to take medications? Independent  Patient Information Are you of Hispanic, Latino/a,or Spanish origin?: A. No, not of Hispanic, Latino/a, or Spanish origin What is your race?: A. White Do you need or want an interpreter to communicate with a doctor or health care staff?: 0. No  Patient's Response To:  Health Literacy and Transportation Is the patient able to respond to health  literacy and transportation needs?: Yes Health Literacy - How often do you need to have someone help you when you read instructions, pamphlets, or  other written material from your doctor or pharmacy?: Never In the past 12 months, has lack of transportation kept you from medical appointments or from getting medications?: No In the past 12 months, has lack of transportation kept you from meetings, work, or from getting things needed for daily living?: No  Journalist, newspaper / Equipment Home Assistive Devices/Equipment: None Home Equipment: None  Prior Device Use: Indicate devices/aids used by the patient prior to current illness, exacerbation or injury? None of the above  Current Functional Level Cognition  Overall Cognitive Status: Impaired/Different from baseline Orientation Level: Oriented X4 Following Commands: Follows one step commands consistently Safety/Judgement: Decreased awareness of deficits General Comments: min cues for sternal prec this session,    Extremity Assessment (includes Sensation/Coordination)  Upper Extremity Assessment: Right hand dominant RUE Deficits / Details: Globally RUE Sensation: WNL RUE Coordination: WNL LUE Deficits / Details: Improved A/AAROM to LUE.  Able to achieve full grasp, and 3/4 finger extension.  AROM noted to wrist, elbow and shoulder.  MMT grossly 2+/5 LUE Sensation: WNL LUE Coordination: decreased fine motor, decreased gross motor  Lower Extremity Assessment: Defer to PT evaluation LLE Deficits / Details: grossly 3-/5    ADLs  Overall ADL's : Needs assistance/impaired Eating/Feeding: Sitting, Minimal assistance Grooming: Oral care, Brushing hair, Wash/dry face, Standing, Sitting, Contact guard assist, Minimal assistance Grooming Details (indicate cue type and reason): minA when standing for steadying assist Upper Body Bathing: Moderate assistance, Sitting Lower Body Bathing: Maximal assistance, Sitting/lateral leans Upper Body  Dressing : Moderate assistance, Sitting Upper Body Dressing Details (indicate cue type and reason): doffling VAD vest Lower Body Dressing: Moderate assistance, Sitting/lateral leans Lower Body Dressing Details (indicate cue type and reason): donning socks, attempting figure 4 Toilet Transfer: Minimal assistance (eva walker) Toilet Transfer Details (indicate cue type and reason): 2 person HHA Toileting- Clothing Manipulation and Hygiene: Total assistance, Sit to/from stand, +2 for safety/equipment Toileting - Clothing Manipulation Details (indicate cue type and reason): total A in standing for rear peri care Functional mobility during ADLs: Minimal assistance General ADL Comments: very limited standing tolerance    Mobility  Overal bed mobility: Needs Assistance Bed Mobility: Rolling, Sidelying to Sit Rolling: Min assist Sidelying to sit: Mod assist, HOB elevated Supine to sit: Mod assist Sit to supine: Min assist General bed mobility comments: min A to elevate feet    Transfers  Overall transfer level: Needs assistance Equipment used: 1 person hand held assist (eva walker) Transfers: Sit to/from Stand, Bed to chair/wheelchair/BSC Sit to Stand: Min assist Bed to/from chair/wheelchair/BSC transfer type:: Step pivot Step pivot transfers: Min assist Transfer via Lift Equipment: Stedy General transfer comment: pt minA from elevated bed to power up, minA to steady during step pvt transfer to [facility] and then back to EOB. decreased L LE step height but no L knee buckling this date    Ambulation / Gait / Stairs / Wheelchair Mobility  Ambulation/Gait Ambulation/Gait assistance: Mod assist, +2 safety/equipment Gait Distance (Feet): 120 Feet Assistive device: Fara Boros Gait Pattern/deviations: Step-through pattern, Decreased step length - right, Decreased step length - left, Trunk flexed General Gait Details: pt requires minA to help guide eva walker, pt very dependent on Eva walker, limited  use of L UE to suport self on regular RW, 2nd person for line management, constant verbal cues to stand up tall and look forward Gait velocity: decr Gait velocity interpretation: <1.31 ft/sec, indicative of household ambulator Pre-gait activities: w/shifting and low amplitude steps in place during 1 of  the standing trials. Stairs assistance: Supervision Stair Management: One rail Left, Alternating pattern, Forwards General stair comments: ascended no rail, descended holding rail with cues    Posture / Balance Dynamic Sitting Balance Sitting balance - Comments: once in supportive position with bilat feet on the floor pt able to maintain static sitting EOB balance however requires unilateral UE support during dynamic sitting balance Balance Overall balance assessment: Needs assistance Sitting-balance support: Feet supported, No upper extremity supported Sitting balance-Leahy Scale: Fair Sitting balance - Comments: once in supportive position with bilat feet on the floor pt able to maintain static sitting EOB balance however requires unilateral UE support during dynamic sitting balance Postural control: Posterior lean, Left lateral lean (attempting to return to supine) Standing balance support: Bilateral upper extremity supported Standing balance-Leahy Scale: Poor Standing balance comment: min assist for static standing while PT performed hygiene s/p BM Standardized Balance Assessment Standardized Balance Assessment : Dynamic Gait Index Dynamic Gait Index Level Surface: Normal Change in Gait Speed: Normal Gait with Horizontal Head Turns: Normal Gait with Vertical Head Turns: Normal Gait and Pivot Turn: Normal Step Over Obstacle: Normal Step Around Obstacles: Normal Steps: Mild Impairment Total Score: 23    Special needs/care consideration New LVAD this admit Palliative GOC as new LVAD 6 weeks IV antibiotics 9/7 until 10/19     Previous Home Environment  Living Arrangements:  Alone Available Help at Discharge: Family, Available 24 hours/day (cousin and her spouse 24/7; dtr expecting baby in Sept. Her 3rd) Type of Home: Mobile home Home Layout: One level Home Access: Stairs to enter Entrance Stairs-Rails: Right, Left Entrance Stairs-Number of Steps: 1 Bathroom Shower/Tub: Health visitor: Standard Bathroom Accessibility: Yes How Accessible: Accessible via walker Home Care Services: No Additional Comments: If pt has LVAD he will go stay with cousins initially in 1 level home.  Discharge Living Setting Plans for Discharge Living Setting: House (cousin's house) Type of Home at Discharge: House Discharge Home Layout: One level Discharge Home Access: Stairs to enter Entrance Stairs-Rails: None Entrance Stairs-Number of Steps: 2 Discharge Bathroom Shower/Tub: Tub/shower unit Discharge Bathroom Toilet: Standard Discharge Bathroom Accessibility: Yes How Accessible: Accessible via walker Does the patient have any problems obtaining your medications?: No  Social/Family/Support Systems Patient Roles: Parent Contact Information: daughter and cousin( to stay with cousin, Charles Holmes) Anticipated Caregiver: Charles Holmes and Dewayne Anticipated Caregiver's Contact Information: see contacts Ability/Limitations of Caregiver: none Caregiver Availability: 24/7 Discharge Plan Discussed with Primary Caregiver: Yes Is Caregiver In Agreement with Plan?: Yes Does Caregiver/Family have Issues with Lodging/Transportation while Pt is in Rehab?: No  Goals Patient/Family Goal for Rehab: supervision to min asisst with PT and OT Expected length of stay: ELOS 14 to 20 days Pt/Family Agrees to Admission and willing to participate: Yes Program Orientation Provided & Reviewed with Pt/Caregiver Including Roles  & Responsibilities: Yes  Decrease burden of Care through IP rehab admission: NA  Possible need for SNF placement upon discharge:Not anticipated  Patient  Condition: This patient's medical and functional status has changed since the consult dated: 09/08/22 in which the Rehabilitation Physician determined and documented that the patient's condition is appropriate for intensive rehabilitative care in an inpatient rehabilitation facility. See "History of Present Illness" (above) for medical update. Functional changes are: ***. Patient's medical and functional status update has been discussed with the Rehabilitation physician and patient remains appropriate for inpatient rehabilitation. Will admit to inpatient rehab today.  Preadmission Screen Completed By:  Ottie Glazier RN MSN 09/26/2022 5:09 PM ______________________________________________________________________  Discussed status with Dr. Marland Kitchenon***at *** and received approval for admission today.  Admission Coordinator: Ottie Glazier RN MSN, time***/Date***

## 2022-09-08 NOTE — Progress Notes (Signed)
Daily Progress Note   Patient Name: Charles Holmes       Date: 09/08/2022 DOB: [birth year]  Age: 57 y.o. MRN#: [id number] Attending Physician: Dolores Patty, MD Primary Care Physician: [name], PA-C Admit Date: 08/10/2022  Reason for Consultation/Follow-up: Establishing goals of care  Length of Stay: 29   Physical Exam Vitals reviewed.  Constitutional:      General: He is awake.  Pulmonary:     Effort: Pulmonary effort is normal.  Skin:    General: Skin is warm and dry.  Neurological:     Mental Status: He is alert and oriented to person, place, and time.     Cranial Nerves: Facial asymmetry present.     Comments: Left facial droop  Psychiatric:        Behavior: Behavior normal.        Thought Content: Thought content normal.             Vital Signs: BP 98/82   Pulse (!) 101   Temp (!) 97.4 F (36.3 C) (Oral)   Resp (!) 22   Ht 5\' 7"  (1.702 m)   Wt 80.8 kg   SpO2 95%   BMI 27.90 kg/m  SpO2: SpO2: 95 % O2 Device: O2 Device: Nasal Cannula O2 Flow Rate: O2 Flow Rate (L/min): 1 L/min  Palliative Care Assessment & Plan   Patient Profile: 57 y.o. 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   Full assessment and workup; status post LVAD implant on [phone]. Postop day 1 code stroke called, CT significant for acute right MCA infarct, status post thrombectomy.   Today's Discussion: Extensive chart review has been completed prior to meeting with patient including labs, vital signs,  imaging, progress/consult notes, orders, medications and available advance directive documents.   Patient is resting in bed. He is grateful he was able to go outside today. He states he did not sleep well last night. CIR evaluated him today. He is hopeful to be able to have intensive rehab so he "can get home." He is hopeful the  cortrak will be removed soon. He states the tube and his missing teeth interfere with his eating. I encouraged him to maximize his calories with Ensure supplements.  Recommendations/Plan: Partial code status- no CPR due to LVAD Full scope Continued PMT support  Code Status:    Code Status Orders  (From admission, onward)           Start     Ordered   08/29/22 1529  Limited resuscitation (code)  Continuous       Comments: Ventricular Assist Device in place.  Question Answer Comment  In the event of cardiac or respiratory ARREST: Initiate Code Blue, Call Rapid Response Yes   In the event of cardiac or respiratory ARREST: Perform CPR No   In the event of cardiac or respiratory ARREST: Perform Intubation/Mechanical Ventilation Yes   In the event of cardiac or respiratory ARREST: Use NIPPV/BiPAp only if indicated Yes   In the event of cardiac or respiratory ARREST: Administer ACLS medications if indicated Yes   In the event of cardiac or respiratory ARREST: Perform Defibrillation or Cardioversion if indicated Yes      08/29/22 1528            Care plan was discussed with bedside RN  Time spent: 25 minutes  Thank you for allowing the Palliative Medicine Team to assist in the care of this patient.   Sherryll Burger, NP  Please contact Palliative Medicine Team phone at [phone] for questions and concerns.

## 2022-09-08 NOTE — Progress Notes (Signed)
LVAD Coordinator Rounding Note:  Admitted 08/10/22 due to acute on chronic CHF with cardiogenic shock. Milrinone dependent. Advance therapy workup completed, and pt deemed acceptable VAD candidate. Dental extractions completed 8/6. IABP placed 08/25/22.  HM 3 LVAD implanted on 08/29/22 by Dr Donata Clay under destination therapy criteria. 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 Result negative.  7/25 Admit with cardiogenic shock. Started milrinone and NE. 8/6 S/P 13 teeth extractions  8/9 IABP placed 8/13 S/p HM III LVAD implant + clipping left atrial appendage d/t severe thickening and invagination of mitral valve annulus impeding flows  CT Head 8/14 (initial) 1. 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 ASPECTS is 7. 2. No acute hemorrhage.  CT Angio Head/Neck 1. 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 2. Core infarct of 12 cc (somewhat underestimated compared to aspects) with 90 cc of penumbra. 3. Mild atherosclerosis.  Pt taken emergently to IR for percutaneous right common carotid arteriogram with thrombectomy. Revascularization achieved. Angio-seal closure device applied to left groin- clean, dry, and intact.   CT Head 8/15 @ 0450 Unchanged extent of infarct and hemorrhage in the right MCA distribution including small volume intraventricular clot. No hydrocephalus.  CT Head 8/17 @ 0701 Interval evolution of the right MCA territory infarct with decreased intraparenchymal and intraventricular hemorrhage. No hydrocephalus or midline shift.   Pt lying in bed on my arrival. Pt on Sildenafil, still requiring Epi at 8.5 mcg/min Pt progressing with PT/OT/SLP. CIR consulted once medically stable.   Vital signs: Temp: 97.4 HR: 101 Doppler Pressure: 90 Automatic BP: 98/82 (89) O2 Sat: 95% on RA Wt: 183.6>191.1>190.9>190.2>184.8>178.8>178.1lbs   LVAD interrogation reveals:  Speed: 5300 Flow: 4.5 Power: 3.9 w PI:  2.9  Alarms: none Events: none  Hematocrit: 29 Fixed speed: 5300 Low speed limit: 5000  Drive Line: Existing VAD dressing CDI. Anchor secure. Continue every other day dressing changes. Next dressing change due 09/11/22 by VAD coordinator or nurse champion only.   Labs:  LDH trend: 596>441>165>384>345>312>312>350  INR trend: 1.4>1.6>1.1>1.1>1.3>1.2>1  AST/ALT trend: 218/45>96/30>73/23>63/36>58/37>69/50>93/72>123/103  Total Bili trend: 4.4>4.6>5.1>1.8>1.9>1.2>1.0>1.5  WBC trend: 11.6>13.7>19.6>16.1>23>26.6>21>24.6  Anticoagulation Plan: -INR Goal: 2.0 - 2.5 -ASA Dose: 325 mg  -Heparin 500 u/hr - Coumadin dosing per pharmacy  Blood Products:  IntraOp 8/14: - 4 FFP - 2 Platelets - 2 PRBC - 1 cyro - 449 cc of cellsaver - DDAVP 20 mcg x 1   Device: [employer] BiV -  -Therapies: OFF  Arrythmias:   Respiratory: RA  Infection:  09/01/22>> sputum cx>> NGTD>>final 09/01/22>> blood cx>> NGTD>>final 09/01/22>> urine cx>> NGTD>> final  Renal:  8/22: BUN/CRT: 28/0.75  Adverse Events on VAD: - 08/30/22: - Developed left sided weakness this am. CTA with R MCA infarct. Taken to IR for thrombectomy   Drips:  Epinephrine 8.5 mcg/min Milrinone 0.375 mcg/kg/min  Patient Education: Discussed and demonstrated power source change with pt. Left extremity too weak at this time to perform power change on his own.  No family at bedside.   Plan/Recommendations:  Please page VAD coordinator for any alarms or VAD equipment issues. Advance to MWF dressing changes by VAD coordinator or Nurse Phyllis Ginger RN,BSN VAD Coordinator  Office: [phone]  24/7 Pager: [phone]

## 2022-09-08 NOTE — Plan of Care (Signed)
  Problem: Education: Goal: Understanding of CV disease, CV risk reduction, and recovery process will improve Outcome: Progressing   Problem: Nutrition: Goal: Adequate nutrition will be maintained Outcome: Progressing   Problem: Coping: Goal: Level of anxiety will decrease Outcome: Progressing   Problem: Safety: Goal: Ability to remain free from injury will improve Outcome: Progressing

## 2022-09-08 NOTE — Progress Notes (Signed)
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 Off anticoagulation now  due to mild hemorrhagic extension by f/u CT.  Patient was started on IV antibiotics postop day 6 for elevated white count and fever.  Cultures are negative.  Chest x-ray shows only mild atelectasis no significant effusion.  Sternal incision looks clearing and dry.  Temperature curve and white count continue to improve.  Blood cultures sputum culture and urine cultures remain negative.  Chest x-ray improving with significant diuresis.He is close to preop weight.  Patient's left-sided weakness is being treated with physical therapy and his mobility is improving, able to stand and pivot to the chair.   PharmD is slowly loading coumadin with low dose heparin bridge.  INR currently 1.0, coumadin dose has been increased  Plan slow epi wean after postop  diuresis/fluid removal accomplished  LVAD INTERROGATION:  HeartMate II LVAD:  Flow 4.3  liters/min, speed 5300 rpm, power 3.6, PI 4.1.  Controller intact.   Objective:    Vital Signs:   Temp:  [98 F (36.7 C)-100.8 F (38.2 C)] 98 F (36.7 C) (08/23 0723) Pulse Rate:  [98-222] 107 (08/23 0800) Resp:  [17-28] 24 (08/23 0800) BP: (74-104)/(49-93) 83/68 (08/23 0800) SpO2:  [89 %-98 %] 98 % (08/23  0820) Weight:  [80.8 kg] 80.8 kg (08/23 0500) Last BM Date : 09/07/22 Mean arterial Pressure 88-92 mm Hg  Intake/Output:  Exam  General -alert moving L side better, speech better HEENT:-No bleeding from mouth at extraction sits Neck: supple. JVP . Carotids without bruits. No lymphadenopathy or thryomegaly appreciated. Cor: Mechanical heart sounds with LVAD hum present. Lungs: clear Abdomen: soft, nontender, nondistended. No hepatosplenomegaly. No bruits or masses. Good bowel sounds. Extremities: no cyanosis, clubbing, rash, edema Neuro: alert & orientedx3, cranial nerves grossly intact. moves all 4 extremities .Improving L arm,leg motor function.  Telemetry: paced with PPM 90/min  Labs: Basic Metabolic Panel: Recent Labs  Lab 09/02/22 0527 09/02/22 1633 09/03/22 0333 09/04/22 0447 09/05/22 0348 09/05/22 2217 09/06/22 0359 09/07/22 0407 09/08/22 0408  NA 134*  --  136 134* 132* 134* 134* 134* 131*  K 3.7  --  4.4 4.1 3.6 3.4* 4.0 3.8 3.9  CL 105  --  103 96* 93* 93* 95* 93* 94*  CO2 24  --  26 28 30 31 30 27  32  GLUCOSE 152*  --  183* 134* 147* 190* 186* 213* 181*  BUN 22*  --  25* 22* 25* 24* 27* 28* 33*  CREATININE 0.91  --  0.85 0.68 0.73 0.72 1.03 0.75 0.95  CALCIUM 8.2*  --  8.2* 8.2* 8.0* 7.7* 7.8* 7.9* 7.7*  MG 2.1 2.0 1.8 1.8 1.9  --   --  1.6* 2.1  PHOS 2.4* 2.3* 1.7* 2.3* 2.9  --   --   --   --     Liver Function Tests: Recent Labs  Lab 09/04/22 0447 09/05/22  8657 09/06/22 0359 09/07/22 0407 09/08/22 0408  AST 63* 58* 69* 93* 123*  ALT 36 37 50* 72* 103*  ALKPHOS 156* 131* 127* 159* 174*  BILITOT 1.8* 1.9* 1.2 1.0 1.5*  PROT 5.3* 5.1* 4.9* 5.2* 5.4*  ALBUMIN 2.2* 1.9* 1.7* 1.7* 1.8*   No results for input(s): "LIPASE", "AMYLASE" in the last 168 hours. No results for input(s): "AMMONIA" in the last 168 hours.  CBC: Recent Labs  Lab 09/05/22 0348 09/05/22 2217 09/06/22 0359 09/07/22 0407 09/08/22 0408  WBC 23.0* 29.0* 26.6* 21.0* 24.6*   HGB 9.7* 9.1* 8.6* 8.6* 9.2*  HCT 30.3* 28.7* 27.3* 27.9* 29.4*  MCV 98.7 96.6 98.9 100.4* 100.3*  PLT 395 400 389 435* 444*    INR: Recent Labs  Lab 09/05/22 0348 09/06/22 0359 09/07/22 0407 09/07/22 0447 09/08/22 0408  INR 1.1 1.3* 1.2 1.2 1.0    Other results: EKG:   Imaging: DG Chest Port 1 View  Result Date: 09/07/2022 CLINICAL DATA:  LVAD EXAM: PORTABLE CHEST 1 VIEW COMPARISON:  09/06/2022. FINDINGS: Enlarged cardiac silhouette. Mild vascular congestion. Left-sided small pleural effusion. No pneumothorax. There is a right-sided PICC tip overlies mid SVC. Left-sided pacer and left ventricular cyst devices. Left atrial clip. Median sternotomy wires. Feeding tube tip below the diaphragm and off the x-ray. IMPRESSION: Enlarged cardiac silhouette. Mild vascular congestion. Small left-sided pleural effusion. Electronically Signed   By: Layla Maw M.D.   On: 09/07/2022 10:14     Medications:     Scheduled Medications:  sodium chloride   Intravenous Once   aspirin  324 mg Per Tube Daily   atorvastatin  80 mg Per Tube Daily   Chlorhexidine Gluconate Cloth  6 each Topical Q0600   dorzolamide-timolol  1 drop Both Eyes BID   feeding supplement  237 mL Oral TID BM   feeding supplement (PROSource TF20)  60 mL Per Tube BID   feeding supplement (VITAL 1.5 CAL)  780 mL Per Tube Q24H   fiber supplement (BANATROL TF)  60 mL Per Tube BID   furosemide  40 mg Intravenous BID   insulin aspart  0-24 Units Subcutaneous TID WC & HS   latanoprost  1 drop Both Eyes QHS   melatonin  3 mg Oral QHS   mexiletine  250 mg Per Tube BID   mometasone-formoterol  2 puff Inhalation BID   multivitamin with minerals  1 tablet Per Tube Daily   mouth rinse  15 mL Mouth Rinse 4 times per day   pantoprazole (PROTONIX) IV  40 mg Intravenous QHS   polyethylene glycol  17 g Per Tube Daily   potassium chloride  40 mEq Per Tube BID   QUEtiapine  50 mg Oral QHS   sildenafil  20 mg Per Tube TID    sodium chloride flush  3 mL Intravenous Q12H   umeclidinium bromide  1 puff Inhalation Daily   warfarin  2.5 mg Oral ONCE-1600   Warfarin - Pharmacist Dosing Inpatient   Does not apply q1600    Infusions:  sodium chloride Stopped (09/08/22 0728)   epinephrine 8 mcg/min (09/08/22 0800)   heparin 500 Units/hr (09/08/22 0800)   meropenem (MERREM) IV Stopped (09/08/22 8469)   milrinone 0.375 mcg/kg/min (09/08/22 0800)   vancomycin Stopped (09/08/22 0018)    PRN Medications: sodium chloride, acetaminophen, albuterol, dextrose, ondansetron (ZOFRAN) IV, mouth rinse, oxyCODONE, sodium chloride, sodium chloride flush, traMADol   Assessment:  57 year old patient with sarcoidosis, COPD history of stroke  and nonischemic cardiomyopathy  with preoperative cardiogenic shock requiring balloon pump and inotropic therapy.  Postoperative  right middle cerebral artery CVA treated with neuro-- radiology invention,   Postop RV dysfunction req inhaled NO and inotropes with incresad bilirubin 1.2>> 4.9 Now off inhaled NO and bilirubin improving Patient now diuresing well, will continue low-dose epinephrine until fluid status improved. Plan/Discussion:   RV dysfunction status post VAD implant-continue low-dose epinephrine, wean later  Postoperative right middle cerebral CVA.  Initial hemorrhagic extension was mild and has started to improve. Low dose heparin started.  Coumadin dosing has started by PharmD  Tube feeds at night, encourage PO intake- swallow function ok after CVA  Continue IV vancomycin meropenem at least 1 week for fever and elevated white count.   Remove EPWs when off iv heparin, INR reasonable  I reviewed the LVAD parameters from today, and compared the results to the patient's prior recorded data.  No programming changes were made.  The LVAD is functioning within specified parameters.  The patient performs LVAD self-test daily.  LVAD interrogation was negative for any significant power  changes, alarms or PI events/speed drops.  LVAD equipment check completed and is in good working order.  Back-up equipment present.   LVAD education done on emergency procedures and precautions and reviewed exit site care.  Length of Stay: [address]  Lovett Sox 09/08/2022, 8:29 AM

## 2022-09-09 ENCOUNTER — Inpatient Hospital Stay (HOSPITAL_COMMUNITY): Payer: Medicare Other

## 2022-09-09 DIAGNOSIS — I5023 Acute on chronic systolic (congestive) heart failure: Secondary | ICD-10-CM | POA: Diagnosis not present

## 2022-09-09 DIAGNOSIS — R570 Cardiogenic shock: Secondary | ICD-10-CM | POA: Diagnosis not present

## 2022-09-09 LAB — COMPREHENSIVE METABOLIC PANEL
ALT: 103 U/L — ABNORMAL HIGH (ref 0–44)
AST: 113 U/L — ABNORMAL HIGH (ref 15–41)
Albumin: 2.2 g/dL — ABNORMAL LOW (ref 3.5–5.0)
Alkaline Phosphatase: 168 U/L — ABNORMAL HIGH (ref 38–126)
Anion gap: 10 (ref 5–15)
BUN: 32 mg/dL — ABNORMAL HIGH (ref 6–20)
CO2: 28 mmol/L (ref 22–32)
Calcium: 8.1 mg/dL — ABNORMAL LOW (ref 8.9–10.3)
Chloride: 95 mmol/L — ABNORMAL LOW (ref 98–111)
Creatinine, Ser: 0.77 mg/dL (ref 0.61–1.24)
GFR, Estimated: >60 mL/min (ref >60)
Glucose, Bld: 174 mg/dL — ABNORMAL HIGH (ref 70–99)
Potassium: 4.2 mmol/L (ref 3.5–5.1)
Sodium: 133 mmol/L — ABNORMAL LOW (ref 135–145)
Total Bilirubin: 1 mg/dL (ref 0.3–1.2)
Total Protein: 5.9 g/dL — ABNORMAL LOW (ref 6.5–8.1)

## 2022-09-09 LAB — MAGNESIUM: Magnesium: 2 mg/dL (ref 1.7–2.4)

## 2022-09-09 LAB — COOXEMETRY PANEL
Carboxyhemoglobin: 2 % — ABNORMAL HIGH (ref 0.5–1.5)
Methemoglobin: 0.8 % (ref 0.0–1.5)
O2 Saturation: 67.5 %
Total hemoglobin: 9 g/dL — ABNORMAL LOW (ref 12.0–16.0)

## 2022-09-09 LAB — GLUCOSE, CAPILLARY
Glucose-Capillary: 144 mg/dL — ABNORMAL HIGH (ref 70–99)
Glucose-Capillary: 152 mg/dL — ABNORMAL HIGH (ref 70–99)
Glucose-Capillary: 135 mg/dL — ABNORMAL HIGH (ref 70–99)
Glucose-Capillary: 120 mg/dL — ABNORMAL HIGH (ref 70–99)
Glucose-Capillary: 85 mg/dL (ref 70–99)
Glucose-Capillary: 116 mg/dL — ABNORMAL HIGH (ref 70–99)
Glucose-Capillary: 120 mg/dL — ABNORMAL HIGH (ref 70–99)
Glucose-Capillary: 146 mg/dL — ABNORMAL HIGH (ref 70–99)

## 2022-09-09 LAB — CBC
HCT: 27.4 % — ABNORMAL LOW (ref 39.0–52.0)
Hemoglobin: 8.5 g/dL — ABNORMAL LOW (ref 13.0–17.0)
MCH: 31.5 pg (ref 26.0–34.0)
MCHC: 31 g/dL (ref 30.0–36.0)
MCV: 101.5 fL — ABNORMAL HIGH (ref 80.0–100.0)
Platelets: 425 10*3/uL — ABNORMAL HIGH (ref 150–400)
RBC: 2.7 MIL/uL — ABNORMAL LOW (ref 4.22–5.81)
RDW: 17.6 % — ABNORMAL HIGH (ref 11.5–15.5)
WBC: 22.2 10*3/uL — ABNORMAL HIGH (ref 4.0–10.5)
nRBC: 0 % (ref 0.0–0.2)

## 2022-09-09 LAB — PROTIME-INR
INR: 1.2 (ref 0.8–1.2)
Prothrombin Time: 15.3 s — ABNORMAL HIGH (ref 11.4–15.2)

## 2022-09-09 LAB — LACTATE DEHYDROGENASE: LDH: 346 U/L — ABNORMAL HIGH (ref 98–192)

## 2022-09-09 LAB — HEPARIN LEVEL (UNFRACTIONATED): Heparin Unfractionated: <0.1 [IU]/mL — ABNORMAL LOW (ref 0.30–0.70)

## 2022-09-09 MED ORDER — WARFARIN SODIUM 2 MG PO TABS
4.0000 mg | ORAL_TABLET | Freq: Once | ORAL | Status: AC
  Administered 2022-09-09: 4 mg via ORAL
  Filled 2022-09-09: qty 2

## 2022-09-09 MED ORDER — FUROSEMIDE 10 MG/ML IJ SOLN
20.0000 mg | Freq: Two times a day (BID) | INTRAMUSCULAR | Status: AC
  Administered 2022-09-09 (×2): 20 mg via INTRAVENOUS
  Filled 2022-09-09 (×2): qty 2

## 2022-09-09 NOTE — Progress Notes (Signed)
ANTICOAGULATION CONSULT NOTE  Pharmacy Consult for heparin + Coumadin Indication:  LVAD  Allergies  Allergen Reactions   Amiodarone     Severe tremors   Percocet [Oxycodone-Acetaminophen] Itching    Patient Measurements: Height: 5\' 7"  (170.2 cm) Weight: 80.8 kg (178 lb 2.1 oz) IBW/kg (Calculated) : 66.1 Heparin Dosing Weight: 87kg  Vital Signs: Temp: 98.6 F (37 C) (08/24 0315) Temp Source: Axillary (08/24 0315) BP: 90/70 (08/24 0700) Pulse Rate: 95 (08/24 0700)  Labs: Recent Labs    09/07/22 0407 09/07/22 0447 09/08/22 0408 09/09/22 0516  HGB 8.6*  --  9.2* 8.5*  HCT 27.9*  --  29.4* 27.4*  PLT 435*  --  444* 425*  APTT 88* 33 35  --   LABPROT 15.7* 15.1 13.8 15.3*  INR 1.2 1.2 1.0 1.2  HEPARINUNFRC 0.47 <0.10* <0.10* <0.10*  CREATININE 0.75  --  0.95 0.77    Estimated Creatinine Clearance: 101.3 mL/min (by C-G formula based on SCr of 0.77 mg/dL).   Medical History: Past Medical History:  Diagnosis Date   CAD (coronary artery disease)    CHF (congestive heart failure) (HCC)    GERD (gastroesophageal reflux disease)    Hyperlipidemia    Hypertension    Systolic heart failure (HCC) 2021   LVEF 18%, RVEF 38% on cardiac MRI 12/19/2019. possible cardiac sarcoidosis.   Wide-complex tachycardia 2021   wears LifeVest     Assessment: 57yoM on apixaban PTA for hx AF admitted for LVAD workup. Pt s/p HM3 implant on 8/13 c/b acute CVA postop. Head CT 8/17 stable, ok to start low dose heparin per neuro.   Heparin level <0.1 as expected on heparin drip 500uts/hr - no titration planned. INR 1.2, CBC and LDH stable. Diet is improving.  Goal of Therapy:  Heparin level <0.1 units/ml Monitor platelets by anticoagulation protocol: Yes   Plan:  Continue Heparin 500 units/h - no up titrations  Daily heparin level and CBC Coumadin 4 mg x 1 tonight. Daily INR  Reece Leader, Colon Flattery, [facility] Clinical Pharmacist  09/09/2022 7:49 AM   [facility] pharmacy phone numbers  are listed on [email or web address]

## 2022-09-09 NOTE — Progress Notes (Addendum)
Inpatient Rehab Admissions Coordinator:  Attempted to contact pt's daughter. The number listed [phone]) is not the appropriate number. Called [phone] and left a message. Awaiting return call. Will continue to follow.   1112: Kayla returned call. She acknowledged understanding of CIR goals and expectations. She is supportive of pt pursuing CIR. She gave permission to contact [place].  1156: Spoke with pt's relative Roberta. She also acknowledged understanding of CIR goals and expectations. She is also supportive of pt pursuing CIR. She confirmed that pt will be coming to live with her after discharge and that she and other family will be able to provide 24/7 support for pt after discharge.  Wolfgang Phoenix, MS, CCC-SLP Admissions Coordinator [phone]

## 2022-09-09 NOTE — Progress Notes (Signed)
Patient ID: Charles Holmes, male   DOB: [birth year], 57 y.o.   MRN: [id number]     Advanced Heart Failure Rounding Note  PCP-Cardiologist: Charles Herrlich, MD  [facility]: Dr. Gala Holmes   Subjective:    7/25: Admit with cardiogenic shock. Started milrinone and NE. 8/6 S/P 13 teeth extractions  8/9 IABP placed 8/13 S/p HM III LVAD implant + clipping left atrial appendage d/t severe thickening and invagination of mitral valve annulus impeding flows 8/14 Left-sided hemiplegia. CT head with acute R MCA infarct. IR for thrombectomy. CT head with small to moderate area of hemorrhagic conversion 8/15 extubated  8/17 Low dose heparin gtt restarted, CT head with decreased size of hemorrhagic stroke.  8/18 Neurology signed off 8/19: Limited echo: Mod RV dysfunction, better mitral inflow into the LV and VAD position satisfactory.  Post Op Day #11 No significant events overnight.   LVAD Interrogation HM III: Speed: 5300 Flow: 4.5 PI: 3.0Power: 3.8. No PI events today   Objective:   Weight Range: 80.4 kg Body mass index is 27.76 kg/m.   Vital Signs:   Temp:  [97.4 F (36.3 C)-98.9 F (37.2 C)] 97.9 F (36.6 C) (08/24 0800) Pulse Rate:  [62-139] 128 (08/24 0800) Resp:  [17-26] 26 (08/24 1100) BP: (69-106)/(53-93) 69/57 (08/24 1100) SpO2:  [84 %-99 %] 93 % (08/24 0800) Weight:  [80.4 kg] 80.4 kg (08/24 0630) Last BM Date : 09/08/22  Weight change: Filed Weights   09/07/22 0500 09/08/22 0500 09/09/22 0630  Weight: 80.2 kg 80.8 kg 80.4 kg    Intake/Output:   Intake/Output Summary (Last 24 hours) at 09/09/2022 1127 Last data filed at 09/09/2022 1100 Gross per 24 hour  Intake 3495.99 ml  Output 3305 ml  Net 190.99 ml   Physical Exam  CVP 8 General:  Well appearing. No resp difficulty HEENT: Normal. +cortrak Neck: supple. JVP 12. Carotids 2+ bilat; no bruits. No lymphadenopathy or thyromegaly appreciated. Cor: Mechanical heart sounds with LVAD hum present. Lungs: Clear Abdomen:  soft, nontender, nondistended. No hepatosplenomegaly. No bruits or masses. Good bowel sounds. Driveline: C/D/I; securement device intact. Dressing around DL exit site Extremities: no cyanosis, clubbing, rash, +1 BLE edema. +UNNA boots. PICC RUE Neuro: alert & orientedx3, cranial nerves grossly intact. moves all 4 extremities w/o difficulty. Affect pleasant   Telemetry     ST with BiV pacing 110s 0-3 PVCs (personally reviewed)   Labs    CBC Recent Labs    09/08/22 0408 09/09/22 0516  WBC 24.6* 22.2*  HGB 9.2* 8.5*  HCT 29.4* 27.4*  MCV 100.3* 101.5*  PLT 444* 425*    Basic Metabolic Panel Recent Labs    86/57/84 0408 09/09/22 0516  NA 131* 133*  K 3.9 4.2  CL 94* 95*  CO2 32 28  GLUCOSE 181* 174*  BUN 33* 32*  CREATININE 0.95 0.77  CALCIUM 7.7* 8.1*  MG 2.1 2.0    BNP: BNP (last 3 results) Recent Labs    08/10/22 1142 08/30/22 0258 09/04/22 2144  BNP 1,905.0* 922.3* 1,050.6*     Imaging    DG Chest Port 1 View  Result Date: 09/09/2022 CLINICAL DATA:  Left ventricular assist device present. EXAM: PORTABLE CHEST 1 VIEW COMPARISON:  One view chest x-ray 09/08/2022 FINDINGS: The heart is enlarged. Left ventricular assist device is stable. Pacing and AICD leads are stable. The small bore feeding tube courses off the inferior border the film. Right-sided PICC line is stable. Mild pulmonary vascular congestion is stable. Small effusions and  bibasilar atelectasis are similar the prior study. Remote right-sided rib fractures are again noted. IMPRESSION: 1. Stable cardiomegaly and mild pulmonary vascular congestion. 2. Stable small effusions and bibasilar atelectasis. 3. Support apparatus is stable. Electronically Signed   By: Charles Holmes M.D.   On: 09/09/2022 10:16     Medications:     Scheduled Medications:  sodium chloride   Intravenous Once   aspirin  324 mg Oral Daily   atorvastatin  80 mg Oral Daily   Chlorhexidine Gluconate Cloth  6 each Topical  Q0600   dorzolamide-timolol  1 drop Both Eyes BID   feeding supplement  237 mL Oral TID BM   feeding supplement (PROSource TF20)  60 mL Per Tube BID   feeding supplement (VITAL 1.5 CAL)  780 mL Per Tube Q24H   fiber supplement (BANATROL TF)  60 mL Per Tube TID   furosemide  20 mg Intravenous BID   insulin aspart  0-24 Units Subcutaneous TID WC & HS   latanoprost  1 drop Both Eyes QHS   melatonin  3 mg Oral QHS   mexiletine  250 mg Oral BID   mometasone-formoterol  2 puff Inhalation BID   multivitamin with minerals  1 tablet Oral Daily   mouth rinse  15 mL Mouth Rinse 4 times per day   pantoprazole (PROTONIX) IV  40 mg Intravenous QHS   potassium chloride  40 mEq Per Tube BID   QUEtiapine  50 mg Oral QHS   sildenafil  20 mg Per Tube TID   sodium chloride flush  3 mL Intravenous Q12H   umeclidinium bromide  1 puff Inhalation Daily   warfarin  4 mg Oral ONCE-1600   Warfarin - Pharmacist Dosing Inpatient   Does not apply q1600    Infusions:  sodium chloride Stopped (09/08/22 1239)   epinephrine 8 mcg/min (09/09/22 1100)   heparin 500 Units/hr (09/09/22 1100)   meropenem (MERREM) IV Stopped (09/09/22 0609)   milrinone 0.375 mcg/kg/min (09/09/22 1100)   vancomycin 150 mL/hr at 09/09/22 1100    PRN Medications: sodium chloride, acetaminophen, albuterol, dextrose, ondansetron (ZOFRAN) IV, mouth rinse, oxyCODONE, polyethylene glycol, sodium chloride, sodium chloride flush, traMADol   Patient Profile  Charles Holmes is a 57 y.o. 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 Admitted with cardiogenic shock, stabilized and underwent HM3 LVAD. Post implant course c/b acute CVA.   Assessment/Plan  1.  Acute on chronic Systolic HF-->Cardiogenic Shock  - Diagnosed 11/2019. Presented with VT. LHC 70% LAD  - cMRI 12/21 concerning for sarcoid and EF 18%.  - PET 2/22 at [facility] EF 25% + active sarcoid - Echo 08/26/20 EF < 20% severely  dilated LV RV mildly decreased.  - [employer] CRT-D upgrade in 06/08/21 - Echo 07/10/22: EF <20%, RV okay, mod pericardial effusion, mod Charles/TR - Admitted 07/25 with cardiogenic shock. - RHC: Nonobstructive CAD, severely elevated filling pressures and low Fick CO/CI (2.7/1.4) - 08/13 HM III LVAD implant + clipping LAA d/t severe thickening and invagination of mitral valve annulus impeding flows.  - Apical core sent to pathology to confirm diagnosis of cardiac sarcoid >> pending - Speed increased to 5300 on 08/14.  - This morning on epinephrine and milrinone 0.335mcg/kg/min with CVP of 8. Will start weaning epinephrine today. 5L urine output yesterday w/ IV lasix 40mg  BID. Will decrease to 20mg  IV BID.  - If we have difficulty weaning epinephrine can consider repeat  RHC to assess PAPi and PA pressures; unsure if sildenafil 20mg  TID is helping with PA pressures or leading to further vasoplegia. For the time being will continue to wean epinephrine with sildenafil 20mg  TID (pre-op RHC with PA 68/31).   2.  Acute stroke - Hx CVA 06/24 -Admitted 06/24 w/ R MCA stroke. S/p TPA and mechanical clot extraction. No residual deficits. Likely cardioembolic in setting of severe LV dysfunction. - Developed left sided weakness 08/14. CTA with R MCA infarct. Taken to IR for thrombectomy - Repeat CT head with small to moderate size hemorrhagic conversion. CT head this am stable. - D/w neuro, repeat head CT on 8/17 w/ improved hemorrhagic CVA, now back on low dose heparin per neurology.  - Back on warfarin - neurology signed off 09/03/22.  - C/w PT/OT.  - Continue ASA 325  - Has Cor-Trak and still getting nocturnal tube feeds, intake improving. On dysphagia 1 diet. Dietitian following   - will need CIR once medically stable for d/c (team following)   3. 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 Continue mexiletine  - now s/p ICD.   4. CAD - LHC 12/07/19 70-% LAD, no  intervention - LHC 8/24 non obstructive CAD.  - Continue statin. On aspirin for VAD.  5. Cardiac sarcoid - PET 2/22 at [facility] EF 25% + active sarcoid - Has completed prednisone.  - holding methotrexate w/ recent surgery  - Sent apical core to pathology to confirm diagnosis of cardiac sarcoid  6. 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 AKI - suspect cardiorenal, improved w/ inotropic support - Creatinine 0.95 today.  - follow BMP w/ diuresis    8. Iron deficiency anemia/ Post-op anemia - recent T sat 15%, scheduled for OP feraheme. Will complete inpatient  - Transfused 1 u RBCs 8/15 - Hgb satble     9. Pulmonary  - PFTs with severe obstructive defect, response to bronchodilator. FEV1 1.04L, FEV1/FVC 48% - extubated 8/15  - Cultures NGTD.   10. Elevated LFTs -AST/ALT improved  11. ID - WBC 126>13.8>16.1>23>26>21>24.6K  - cont Merrem/vanc  - Cultures with NGTD - Incentive spirometer and flutter valve at bedside, encouraged to use  Continue to mobilize!   Length of Stay: [address] DO  09/09/2022, 11:27 AM  VAD Team Pager [phone] (7am - 7am)   Advanced Heart Failure Team Pager [phone] (M-F; 7a - 5p)  Please contact [facility] for night-coverage after hours (5p -7a ) and weekends on [email or web address]  CRITICAL CARE Performed by: Dorthula Nettles   Total critical care time: 35 minutes  Critical care time was exclusive of separately billable procedures and treating other patients.  Critical care was necessary to treat or prevent imminent or life-threatening deterioration.  Critical care was time spent personally by me on the following activities: development of treatment plan with patient and/or surrogate as well as nursing, discussions with consultants, evaluation of patient's response to treatment, examination of patient, obtaining history from patient or surrogate, ordering and performing treatments and interventions, ordering and review of  laboratory studies, ordering and review of radiographic studies, pulse oximetry and re-evaluation of patient's condition.

## 2022-09-10 DIAGNOSIS — I428 Other cardiomyopathies: Secondary | ICD-10-CM | POA: Diagnosis not present

## 2022-09-10 DIAGNOSIS — Z515 Encounter for palliative care: Secondary | ICD-10-CM | POA: Diagnosis not present

## 2022-09-10 DIAGNOSIS — I5023 Acute on chronic systolic (congestive) heart failure: Secondary | ICD-10-CM | POA: Diagnosis not present

## 2022-09-10 DIAGNOSIS — R570 Cardiogenic shock: Secondary | ICD-10-CM | POA: Diagnosis not present

## 2022-09-10 LAB — COMPREHENSIVE METABOLIC PANEL
ALT: 138 U/L — ABNORMAL HIGH (ref 0–44)
AST: 146 U/L — ABNORMAL HIGH (ref 15–41)
Albumin: 2 g/dL — ABNORMAL LOW (ref 3.5–5.0)
Alkaline Phosphatase: 159 U/L — ABNORMAL HIGH (ref 38–126)
Anion gap: 9 (ref 5–15)
BUN: 32 mg/dL — ABNORMAL HIGH (ref 6–20)
CO2: 26 mmol/L (ref 22–32)
Calcium: 7.9 mg/dL — ABNORMAL LOW (ref 8.9–10.3)
Chloride: 96 mmol/L — ABNORMAL LOW (ref 98–111)
Creatinine, Ser: 0.74 mg/dL (ref 0.61–1.24)
GFR, Estimated: >60 mL/min (ref >60)
Glucose, Bld: 196 mg/dL — ABNORMAL HIGH (ref 70–99)
Potassium: 4.3 mmol/L (ref 3.5–5.1)
Sodium: 131 mmol/L — ABNORMAL LOW (ref 135–145)
Total Bilirubin: 0.9 mg/dL (ref 0.3–1.2)
Total Protein: 5.9 g/dL — ABNORMAL LOW (ref 6.5–8.1)

## 2022-09-10 LAB — GLUCOSE, CAPILLARY
Glucose-Capillary: 149 mg/dL — ABNORMAL HIGH (ref 70–99)
Glucose-Capillary: 139 mg/dL — ABNORMAL HIGH (ref 70–99)
Glucose-Capillary: 107 mg/dL — ABNORMAL HIGH (ref 70–99)
Glucose-Capillary: 121 mg/dL — ABNORMAL HIGH (ref 70–99)

## 2022-09-10 LAB — LACTATE DEHYDROGENASE: LDH: 350 U/L — ABNORMAL HIGH (ref 98–192)

## 2022-09-10 LAB — CBC
HCT: 28.1 % — ABNORMAL LOW (ref 39.0–52.0)
Hemoglobin: 8.7 g/dL — ABNORMAL LOW (ref 13.0–17.0)
MCH: 31.1 pg (ref 26.0–34.0)
MCHC: 31 g/dL (ref 30.0–36.0)
MCV: 100.4 fL — ABNORMAL HIGH (ref 80.0–100.0)
Platelets: 471 10*3/uL — ABNORMAL HIGH (ref 150–400)
RBC: 2.8 MIL/uL — ABNORMAL LOW (ref 4.22–5.81)
RDW: 17.3 % — ABNORMAL HIGH (ref 11.5–15.5)
WBC: 22.5 10*3/uL — ABNORMAL HIGH (ref 4.0–10.5)
nRBC: 0.1 % (ref 0.0–0.2)

## 2022-09-10 LAB — MAGNESIUM: Magnesium: 2 mg/dL (ref 1.7–2.4)

## 2022-09-10 LAB — COOXEMETRY PANEL
Carboxyhemoglobin: 2.7 % — ABNORMAL HIGH (ref 0.5–1.5)
Methemoglobin: 0.2 % (ref 0.0–1.5)
O2 Saturation: 75.5 %
Total hemoglobin: 9 g/dL — ABNORMAL LOW (ref 12.0–16.0)

## 2022-09-10 LAB — HEPARIN LEVEL (UNFRACTIONATED)
Heparin Unfractionated: 0.4 [IU]/mL (ref 0.30–0.70)
Heparin Unfractionated: <0.1 [IU]/mL — ABNORMAL LOW (ref 0.30–0.70)

## 2022-09-10 LAB — PROTIME-INR
INR: 1.4 — ABNORMAL HIGH (ref 0.8–1.2)
Prothrombin Time: 17.5 s — ABNORMAL HIGH (ref 11.4–15.2)

## 2022-09-10 MED ORDER — ALBUMIN HUMAN 25 % IV SOLN
12.5000 g | Freq: Four times a day (QID) | INTRAVENOUS | Status: AC
  Administered 2022-09-10 (×2): 12.5 g via INTRAVENOUS
  Filled 2022-09-10 (×2): qty 50

## 2022-09-10 MED ORDER — FUROSEMIDE 10 MG/ML IJ SOLN
40.0000 mg | Freq: Two times a day (BID) | INTRAMUSCULAR | Status: DC
  Administered 2022-09-10 (×2): 40 mg via INTRAVENOUS
  Filled 2022-09-10 (×2): qty 4

## 2022-09-10 MED ORDER — WARFARIN SODIUM 2 MG PO TABS
4.0000 mg | ORAL_TABLET | Freq: Once | ORAL | Status: AC
  Administered 2022-09-10: 4 mg via ORAL
  Filled 2022-09-10: qty 2

## 2022-09-10 NOTE — Progress Notes (Signed)
HeartMate 3 Rounding Note  POD #12 Implant HM-3  Subjective:   57 year old male with cardiac sarcoidosis, nonischemic cardiomyopathy, and advanced heart failure refractory to optimal medical therapy underwent HeartMate 3 implantation on August 13 and required preoperative support with intra-aortic balloon pump and inotropic therapy.  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 Off anticoagulation now  due to mild hemorrhagic extension by f/u CT.  Patient was started on IV antibiotics postop day 6 for elevated white count and fever.  Cultures are negative.  Chest x-ray shows only mild atelectasis no significant effusion.  Sternal incision looks clearing and dry.  Temperature curve and white count continue to improve.  Blood cultures sputum culture and urine cultures remain negative.  Chest x-ray improving with significant diuresis.He is close to preop weight.  Patient's left-sided weakness is being treated with physical therapy and his mobility is improving, able to stand and pivot to the chair. OOB to chair daily  PharmD is slowly loading coumadin with low dose heparin bridge.  INR currently 1.3, coumadin dose has been increased  Plan slow epi wean after postop  diuresis/fluid removal accomplished  LVAD INTERROGATION:  HeartMate II LVAD:  Flow 4.3  liters/min, speed 5300 rpm, power 3.6, PI 4.1.  Controller intact.   Objective:    Vital Signs:   Temp:  [99.2 F (37.3 C)-100.7 F (38.2 C)] 100.7 F (38.2 C) (08/25 0405) Pulse Rate:  [95-242] 115 (08/25 0700) Resp:  [16-29] 29 (08/25 0700) BP: (71-125)/(50-86) 89/76 (08/25 0600) SpO2:   [79 %-97 %] 97 % (08/25 0700) Weight:  [79.4 kg] 79.4 kg (08/25 0700) Last BM Date : 09/09/22 Mean arterial Pressure 88-92 mm Hg  Intake/Output:  Exam  General -alert moving L side better, speech better HEENT:-No bleeding from mouth at extraction sits Neck: supple. JVP . Carotids without bruits. No lymphadenopathy or thryomegaly appreciated. Cor: Mechanical heart sounds with LVAD hum present. Lungs: clear Abdomen: soft, nontender, nondistended. No hepatosplenomegaly. No bruits or masses. Good bowel sounds. Extremities: no cyanosis, clubbing, rash, edema Neuro: alert & orientedx3, cranial nerves grossly intact. moves all 4 extremities .Improving L arm,leg motor function.  Telemetry: paced with PPM 90/min  Labs: Basic Metabolic Panel: Recent Labs  Lab 09/04/22 0447 09/05/22 0348 09/05/22 2217 09/06/22 0359 09/07/22 0407 09/08/22 0408 09/09/22 0516 09/10/22 0346  NA 134* 132*   < > 134* 134* 131* 133* 131*  K 4.1 3.6   < > 4.0 3.8 3.9 4.2 4.3  CL 96* 93*   < > 95* 93* 94* 95* 96*  CO2 28 30   < > 30 27 32 28 26  GLUCOSE 134* 147*   < > 186* 213* 181* 174* 196*  BUN 22* 25*   < > 27* 28* 33* 32* 32*  CREATININE 0.68 0.73   < > 1.03 0.75 0.95 0.77 0.74  CALCIUM 8.2* 8.0*   < > 7.8* 7.9* 7.7* 8.1* 7.9*  MG 1.8 1.9  --   --  1.6* 2.1 2.0 2.0  PHOS 2.3* 2.9  --   --   --   --   --   --    < > = values in this interval not  displayed.    Liver Function Tests: Recent Labs  Lab 09/06/22 0359 09/07/22 0407 09/08/22 0408 09/09/22 0516 09/10/22 0346  AST 69* 93* 123* 113* 146*  ALT 50* 72* 103* 103* 138*  ALKPHOS 127* 159* 174* 168* 159*  BILITOT 1.2 1.0 1.5* 1.0 0.9  PROT 4.9* 5.2* 5.4* 5.9* 5.9*  ALBUMIN 1.7* 1.7* 1.8* 2.2* 2.0*   No results for input(s): "LIPASE", "AMYLASE" in the last 168 hours. No results for input(s): "AMMONIA" in the last 168 hours.  CBC: Recent Labs  Lab 09/06/22 0359 09/07/22 0407 09/08/22 0408 09/09/22 0516 09/10/22 0346  WBC 26.6*  21.0* 24.6* 22.2* 22.5*  HGB 8.6* 8.6* 9.2* 8.5* 8.7*  HCT 27.3* 27.9* 29.4* 27.4* 28.1*  MCV 98.9 100.4* 100.3* 101.5* 100.4*  PLT 389 435* 444* 425* 471*    INR: Recent Labs  Lab 09/07/22 0407 09/07/22 0447 09/08/22 0408 09/09/22 0516 09/10/22 0346  INR 1.2 1.2 1.0 1.2 1.4*    Other results: EKG:   Imaging: DG Chest Port 1 View  Result Date: 09/09/2022 CLINICAL DATA:  Left ventricular assist device present. EXAM: PORTABLE CHEST 1 VIEW COMPARISON:  One view chest x-ray 09/08/2022 FINDINGS: The heart is enlarged. Left ventricular assist device is stable. Pacing and AICD leads are stable. The small bore feeding tube courses off the inferior border the film. Right-sided PICC line is stable. Mild pulmonary vascular congestion is stable. Small effusions and bibasilar atelectasis are similar the prior study. Remote right-sided rib fractures are again noted. IMPRESSION: 1. Stable cardiomegaly and mild pulmonary vascular congestion. 2. Stable small effusions and bibasilar atelectasis. 3. Support apparatus is stable. Electronically Signed   By: Marin Roberts M.D.   On: 09/09/2022 10:16     Medications:     Scheduled Medications:  sodium chloride   Intravenous Once   aspirin  324 mg Oral Daily   atorvastatin  80 mg Oral Daily   Chlorhexidine Gluconate Cloth  6 each Topical Q0600   dorzolamide-timolol  1 drop Both Eyes BID   feeding supplement  237 mL Oral TID BM   feeding supplement (PROSource TF20)  60 mL Per Tube BID   feeding supplement (VITAL 1.5 CAL)  780 mL Per Tube Q24H   fiber supplement (BANATROL TF)  60 mL Per Tube TID   furosemide  40 mg Intravenous BID   insulin aspart  0-24 Units Subcutaneous TID WC & HS   latanoprost  1 drop Both Eyes QHS   melatonin  3 mg Oral QHS   mexiletine  250 mg Oral BID   mometasone-formoterol  2 puff Inhalation BID   multivitamin with minerals  1 tablet Oral Daily   mouth rinse  15 mL Mouth Rinse 4 times per day   pantoprazole  (PROTONIX) IV  40 mg Intravenous QHS   QUEtiapine  50 mg Oral QHS   sildenafil  20 mg Per Tube TID   sodium chloride flush  3 mL Intravenous Q12H   umeclidinium bromide  1 puff Inhalation Daily   warfarin  4 mg Oral ONCE-1600   Warfarin - Pharmacist Dosing Inpatient   Does not apply q1600    Infusions:  sodium chloride Stopped (09/08/22 1239)   albumin human 12.5 g (09/10/22 1054)   epinephrine 5 mcg/min (09/10/22 1031)   heparin 500 Units/hr (09/10/22 0700)   meropenem (MERREM) IV Stopped (09/10/22 0630)   milrinone 0.375 mcg/kg/min (09/10/22 0700)   vancomycin Stopped (09/09/22 2317)    PRN Medications: sodium chloride, acetaminophen, albuterol, dextrose,  ondansetron (ZOFRAN) IV, mouth rinse, oxyCODONE, polyethylene glycol, sodium chloride, sodium chloride flush, traMADol   Assessment:  57 year old patient with sarcoidosis, COPD history of stroke  and nonischemic cardiomyopathy with preoperative cardiogenic shock requiring balloon pump and inotropic therapy.  Postoperative  right middle cerebral artery CVA treated with neuro-- radiology invention,   Postop RV dysfunction req inhaled NO and inotropes with incresad bilirubin 1.2>> 4.9 Now off inhaled NO and bilirubin improving Patient now diuresing well, will continue low-dose epinephrine until fluid status improved. Plan/Discussion:   RV dysfunction status post VAD implant-continue low-dose epinephrine, wean slowly- now at 5 mcg  Postoperative right middle cerebral CVA.  Initial hemorrhagic extension was mild and has started to improve. Low dose heparin started.  Coumadin directedby PharmD  Tube feeds at night, encourage PO intake- swallow function ok after CVA  Continue IV vancomycin meropenem at least 1 week for fever and elevated white count. WBC level at 22k- surgical incisions clean, dry.  Foley and all OR lines out   Remove EPWs when off iv heparin, INR reasonable  I reviewed the LVAD parameters from today, and  compared the results to the patient's prior recorded data.  No programming changes were made.  The LVAD is functioning within specified parameters.  The patient performs LVAD self-test daily.  LVAD interrogation was negative for any significant power changes, alarms or PI events/speed drops.  LVAD equipment check completed and is in good working order.  Back-up equipment present.   LVAD education done on emergency procedures and precautions and reviewed exit site care.  Length of Stay: [address]  Lovett Sox 09/10/2022, 11:10 AM

## 2022-09-10 NOTE — Progress Notes (Signed)
ANTICOAGULATION CONSULT NOTE  Pharmacy Consult for heparin + Coumadin Indication:  LVAD  Allergies  Allergen Reactions   Amiodarone     Severe tremors   Percocet [Oxycodone-Acetaminophen] Itching    Patient Measurements: Height: 5\' 7"  (170.2 cm) Weight: 79.4 kg (175 lb 0.7 oz) IBW/kg (Calculated) : 66.1 Heparin Dosing Weight: 87kg  Vital Signs: Temp: 100.7 F (38.2 C) (08/25 0405) Temp Source: Axillary (08/25 0405) BP: 89/76 (08/25 0600) Pulse Rate: 115 (08/25 0700)  Labs: Recent Labs    09/08/22 0408 09/09/22 0516 09/10/22 0346 09/10/22 0350 09/10/22 0751  HGB 9.2* 8.5* 8.7*  --   --   HCT 29.4* 27.4* 28.1*  --   --   PLT 444* 425* 471*  --   --   APTT 35  --   --   --   --   LABPROT 13.8 15.3* 17.5*  --   --   INR 1.0 1.2 1.4*  --   --   HEPARINUNFRC <0.10* <0.10*  --  0.40 <0.10*  CREATININE 0.95 0.77 0.74  --   --     Estimated Creatinine Clearance: 100.4 mL/min (by C-G formula based on SCr of 0.74 mg/dL).   Medical History: Past Medical History:  Diagnosis Date   CAD (coronary artery disease)    CHF (congestive heart failure) (HCC)    GERD (gastroesophageal reflux disease)    Hyperlipidemia    Hypertension    Systolic heart failure (HCC) 2021   LVEF 18%, RVEF 38% on cardiac MRI 12/19/2019. possible cardiac sarcoidosis.   Wide-complex tachycardia 2021   wears LifeVest     Assessment: 57yoM on apixaban PTA for hx AF admitted for LVAD workup. Pt s/p HM3 implant on 8/13 c/b acute CVA postop. Head CT 8/17 stable, ok to start low dose heparin per neuro.   Heparin level initially 0.4 this am, but was drawn from PICC where heparin is running.  Repeat collected peripherally is <0.1 as expected on heparin drip 500uts/hr - no titration planned. INR 1.4, CBC and LDH stable. Diet is improving.  Goal of Therapy:  Heparin level <0.1 units/ml Monitor platelets by anticoagulation protocol: Yes   Plan:  Continue Heparin 500 units/h - no up titrations  Daily  heparin level and CBC Coumadin 4 mg x 1 again tonight. Daily INR  Reece Leader, Colon Flattery, [facility] Clinical Pharmacist  09/10/2022 8:25 AM   [facility] pharmacy phone numbers are listed on [email or web address]

## 2022-09-10 NOTE — Plan of Care (Signed)
  Problem: Education: Goal: Understanding of CV disease, CV risk reduction, and recovery process will improve Outcome: Progressing Goal: Individualized Educational Video(s) Outcome: Progressing   Problem: Activity: Goal: Ability to return to baseline activity level will improve Outcome: Progressing   Problem: Cardiovascular: Goal: Ability to achieve and maintain adequate cardiovascular perfusion will improve Outcome: Progressing Goal: Vascular access site(s) Level 0-1 will be maintained Outcome: Progressing   Problem: Health Behavior/Discharge Planning: Goal: Ability to safely manage health-related needs after discharge will improve Outcome: Progressing   Problem: Education: Goal: Knowledge of General Education information will improve Description: Including pain rating scale, medication(s)/side effects and non-pharmacologic comfort measures Outcome: Progressing   Problem: Health Behavior/Discharge Planning: Goal: Ability to manage health-related needs will improve Outcome: Progressing   Problem: Clinical Measurements: Goal: Ability to maintain clinical measurements within normal limits will improve Outcome: Progressing Goal: Will remain free from infection Outcome: Progressing Goal: Diagnostic test results will improve Outcome: Progressing Goal: Respiratory complications will improve Outcome: Progressing Goal: Cardiovascular complication will be avoided Outcome: Progressing   Problem: Activity: Goal: Risk for activity intolerance will decrease Outcome: Progressing   Problem: Nutrition: Goal: Adequate nutrition will be maintained Outcome: Progressing   Problem: Coping: Goal: Level of anxiety will decrease Outcome: Progressing   Problem: Elimination: Goal: Will not experience complications related to urinary retention Outcome: Progressing   Problem: Pain Managment: Goal: General experience of comfort will improve Outcome: Progressing   Problem:  Safety: Goal: Ability to remain free from injury will improve Outcome: Progressing   Problem: Skin Integrity: Goal: Risk for impaired skin integrity will decrease Outcome: Progressing

## 2022-09-10 NOTE — Progress Notes (Signed)
Patient ID: Charles Holmes, male   DOB: [birth year], 57 y.o.   MRN: [id number]     Advanced Heart Failure Rounding Note  PCP-Cardiologist: Norman Herrlich, MD  [facility]: Dr. Gala Romney   Subjective:    7/25: Admit with cardiogenic shock. Started milrinone and NE. 8/6 S/P 13 teeth extractions  8/9 IABP placed 8/13 S/p HM III LVAD implant + clipping left atrial appendage d/t severe thickening and invagination of mitral valve annulus impeding flows 8/14 Left-sided hemiplegia. CT head with acute R MCA infarct. IR for thrombectomy. CT head with small to moderate area of hemorrhagic conversion 8/15 extubated  8/17 Low dose heparin gtt restarted, CT head with decreased size of hemorrhagic stroke.  8/18 Neurology signed off 8/19: Limited echo: Mod RV dysfunction, better mitral inflow into the LV and VAD position satisfactory.  Post Op Day #11 No significant events overnight.   LVAD Interrogation HM III: Speed: 5300 Flow: 4.6  PI: 2.6 Power: 3.9. No PI events today   Objective:   Weight Range: 79.4 kg Body mass index is 27.42 kg/m.   Vital Signs:   Temp:  [99.2 F (37.3 C)-100.7 F (38.2 C)] 100.7 F (38.2 C) (08/25 0405) Pulse Rate:  [95-242] 115 (08/25 0700) Resp:  [16-29] 29 (08/25 0700) BP: (69-125)/(50-87) 89/76 (08/25 0600) SpO2:  [79 %-97 %] 97 % (08/25 0700) Weight:  [79.4 kg] 79.4 kg (08/25 0700) Last BM Date : 09/09/22  Weight change: Filed Weights   09/08/22 0500 09/09/22 0630 09/10/22 0700  Weight: 80.8 kg 80.4 kg 79.4 kg    Intake/Output:   Intake/Output Summary (Last 24 hours) at 09/10/2022 1012 Last data filed at 09/10/2022 0700 Gross per 24 hour  Intake 3353.15 ml  Output 2425 ml  Net 928.15 ml   Physical Exam  CVP 10 General:  Resting comfortably; no complaints HEENT: Normal. +cortrak Neck: supple. JVP 9-11. Carotids 2+ bilat; no bruits. No lymphadenopathy or thyromegaly appreciated. Cor: Mechanical heart sounds with LVAD hum present. Lungs:  Clear Abdomen: soft, nontender, nondistended. No hepatosplenomegaly. No bruits or masses. Good bowel sounds. Driveline: C/D/I; securement device intact. Dressing around DL exit site Extremities: no cyanosis, clubbing, rash, +1 BLE edema. +UNNA boots. PICC RUE Neuro: alert & orientedx3, cranial nerves grossly intact. moves all 4 extremities w/o difficulty. Affect pleasant   Telemetry     ST with BiV pacing 110s 0-3 PVCs (personally reviewed)   Labs    CBC Recent Labs    09/09/22 0516 09/10/22 0346  WBC 22.2* 22.5*  HGB 8.5* 8.7*  HCT 27.4* 28.1*  MCV 101.5* 100.4*  PLT 425* 471*    Basic Metabolic Panel Recent Labs    40/98/11 0516 09/10/22 0346  NA 133* 131*  K 4.2 4.3  CL 95* 96*  CO2 28 26  GLUCOSE 174* 196*  BUN 32* 32*  CREATININE 0.77 0.74  CALCIUM 8.1* 7.9*  MG 2.0 2.0    BNP: BNP (last 3 results) Recent Labs    08/10/22 1142 08/30/22 0258 09/04/22 2144  BNP 1,905.0* 922.3* 1,050.6*     Imaging    No results found.   Medications:     Scheduled Medications:  sodium chloride   Intravenous Once   aspirin  324 mg Oral Daily   atorvastatin  80 mg Oral Daily   Chlorhexidine Gluconate Cloth  6 each Topical Q0600   dorzolamide-timolol  1 drop Both Eyes BID   feeding supplement  237 mL Oral TID BM   feeding supplement (PROSource TF20)  60 mL Per Tube BID   feeding supplement (VITAL 1.5 CAL)  780 mL Per Tube Q24H   fiber supplement (BANATROL TF)  60 mL Per Tube TID   insulin aspart  0-24 Units Subcutaneous TID WC & HS   latanoprost  1 drop Both Eyes QHS   melatonin  3 mg Oral QHS   mexiletine  250 mg Oral BID   mometasone-formoterol  2 puff Inhalation BID   multivitamin with minerals  1 tablet Oral Daily   mouth rinse  15 mL Mouth Rinse 4 times per day   pantoprazole (PROTONIX) IV  40 mg Intravenous QHS   QUEtiapine  50 mg Oral QHS   sildenafil  20 mg Per Tube TID   sodium chloride flush  3 mL Intravenous Q12H   umeclidinium bromide  1 puff  Inhalation Daily   warfarin  4 mg Oral ONCE-1600   Warfarin - Pharmacist Dosing Inpatient   Does not apply q1600    Infusions:  sodium chloride Stopped (09/08/22 1239)   epinephrine 5 mcg/min (09/10/22 0847)   heparin 500 Units/hr (09/10/22 0700)   meropenem (MERREM) IV Stopped (09/10/22 0630)   milrinone 0.375 mcg/kg/min (09/10/22 0700)   vancomycin Stopped (09/09/22 2317)    PRN Medications: sodium chloride, acetaminophen, albuterol, dextrose, ondansetron (ZOFRAN) IV, mouth rinse, oxyCODONE, polyethylene glycol, sodium chloride, sodium chloride flush, traMADol   Patient Profile  Charles Holmes is a 57 y.o. 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 Admitted with cardiogenic shock, stabilized and underwent HM3 LVAD. Post implant course c/b acute CVA.   Assessment/Plan  1.  Acute on chronic Systolic HF-->Cardiogenic Shock  - Diagnosed 11/2019. Presented with VT. LHC 70% LAD  - cMRI 12/21 concerning for sarcoid and EF 18%.  - PET 2/[facility] EF 25% + active sarcoid - Echo 08/26/20 EF < 20% severely dilated LV RV mildly decreased.  - [employer] CRT-D upgrade in 06/08/21 - Echo 07/10/22: EF <20%, RV okay, mod pericardial effusion, mod Charles/TR - Admitted 07/25 with cardiogenic shock. - RHC: Nonobstructive CAD, severely elevated filling pressures and low Fick CO/CI (2.7/1.4) - 08/13 HM III LVAD implant + clipping LAA d/t severe thickening and invagination of mitral valve annulus impeding flows.  - Apical core sent to pathology to confirm diagnosis of cardiac sarcoid >> pending - Speed increased to 5300 on 08/14.  - This morning on epinephrine (down from 8) & milrinone 0.334mcg/kg/min with CVP of 10; +1L yesterday. Will diurese with IV lasix 40mg  BID today. Plan to continue weaning epinephrine.  - If we have difficulty weaning epinephrine can consider repeat RHC to assess PAPi and PA pressures; unsure if sildenafil 20mg  TID  is helping with PA pressures or leading to further vasoplegia. For the time being will continue to wean epinephrine with sildenafil 20mg  TID (pre-op RHC with PA 68/31).   2.  Acute stroke - Hx CVA 06/24 -Admitted 06/24 w/ R MCA stroke. S/p TPA and mechanical clot extraction. No residual deficits. Likely cardioembolic in setting of severe LV dysfunction. - Developed left sided weakness 08/14. CTA with R MCA infarct. Taken to IR for thrombectomy - Repeat CT head with small to moderate size hemorrhagic conversion. CT head this am stable. - D/w neuro, repeat head CT on 8/17 w/ improved hemorrhagic CVA, now back on low dose heparin per neurology.  - Back on warfarin - neurology signed off 09/03/22.  - C/w PT/OT.  -  Continue ASA 325  - Has Cor-Trak and still getting nocturnal tube feeds, intake improving. On dysphagia 1 diet. Dietitian following   - will need CIR once medically stable for d/c (team following)   3. 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 Continue mexiletine  - now s/p ICD.   4. CAD - LHC 12/07/19 70-% LAD, no intervention - LHC 8/24 non obstructive CAD.  - Continue statin. On aspirin for VAD.  5. Cardiac sarcoid - PET 2/22 at [facility] EF 25% + active sarcoid - Has completed prednisone.  - holding methotrexate w/ recent surgery  - Sent apical core to pathology to confirm diagnosis of cardiac sarcoid  6. 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 AKI - suspect cardiorenal, improved w/ inotropic support - Creatinine stable.  - follow BMP w/ diuresis    8. Iron deficiency anemia/ Post-op anemia - recent T sat 15%, scheduled for OP feraheme. Will complete inpatient  - Transfused 1 u RBCs 8/15 - Hgb satble     9. Pulmonary  - PFTs with severe obstructive defect, response to bronchodilator. FEV1 1.04L, FEV1/FVC 48% - extubated 8/15  - Cultures NGTD.   10. Elevated LFTs -AST/ALT improved  11. ID - WBC ct stable; remains elevated. Will  plan to D/C antibiotics possibly on 09/12/22; discussed with pharmd. - cont Merrem/vanc  - Cultures with NGTD - Incentive spirometer and flutter valve at bedside, encouraged to use  Continue to mobilize!   Length of Stay: [address], DO  09/10/2022, 10:12 AM  VAD Team Pager [phone] (7am - 7am)   Advanced Heart Failure Team Pager [phone] (M-F; 7a - 5p)  Please contact [facility] for night-coverage after hours (5p -7a ) and weekends on [email or web address]  CRITICAL CARE Performed by: Dorthula Nettles   Total critical care time: 40 minutes  Critical care time was exclusive of separately billable procedures and treating other patients.  Critical care was necessary to treat or prevent imminent or life-threatening deterioration.  Critical care was time spent personally by me on the following activities: development of treatment plan with patient and/or surrogate as well as nursing, discussions with consultants, evaluation of patient's response to treatment, examination of patient, obtaining history from patient or surrogate, ordering and performing treatments and interventions, ordering and review of laboratory studies, ordering and review of radiographic studies, pulse oximetry and re-evaluation of patient's condition.

## 2022-09-10 NOTE — Progress Notes (Signed)
Daily Progress Note   Patient Name: Charles Holmes       Date: 09/10/2022 DOB: January 17, 1966  Age: 57 y.o. MRN#: [id number] Attending Physician: Dolores Patty, MD Primary Care Physician: [name], PA-C Admit Date: 08/10/2022  Reason for Consultation/Follow-up: Establishing goals of care  Length of Stay: 9   Physical Exam Vitals reviewed.  Constitutional:      General: He is awake.     Appearance: He is ill-appearing.  HENT:     Head: Normocephalic and atraumatic.  Pulmonary:     Effort: Pulmonary effort is normal.  Skin:    General: Skin is warm and dry.  Neurological:     Mental Status: He is alert and oriented to person, place, and time.     Cranial Nerves: Facial asymmetry present.     Comments: Left facial droop  Psychiatric:        Mood and Affect: Mood is depressed.             Vital Signs: BP (!) 84/71   Pulse (!) 118   Temp 98.7 F (37.1 C) (Oral)   Resp 20   Ht 5\' 7"  (1.702 m)   Wt 79.4 kg   SpO2 94%   BMI 27.42 kg/m  SpO2: SpO2: 94 % O2 Device: O2 Device: Room Air O2 Flow Rate: O2 Flow Rate (L/min): 2 L/min  Intake/output summary:  Intake/Output Summary (Last 24 hours) at 09/10/2022 1300 Last data filed at 09/10/2022 1200 Gross per 24 hour  Intake 3364.37 ml  Output 2550 ml  Net 814.37 ml   LBM: Last BM Date : 09/10/22 Baseline Weight: Weight: 77.1 kg Most recent weight: Weight: 79.4 kg    Patient Active Problem List   Diagnosis Date Noted   Middle cerebral artery embolism, 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   Chronic obstructive pulmonary disease (HCC) 08/23/2022   History of CVA (cerebrovascular accident) 08/11/2022   Paroxysmal atrial fibrillation (HCC) 08/11/2022   Iron deficiency anemia 08/11/2022   CHF (congestive heart failure) (HCC)  08/10/2022   Stroke (cerebrum) (HCC) 07/11/2022   Status post surgery 07/10/2022   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   Coronary artery disease involving native coronary artery of native heart without angina pectoris 03/02/2020   Chronic systolic heart failure (HCC) 02/21/2020   Ventricular tachycardia (HCC) 02/19/2020   Ventricular tachycardia, sustained (  HCC) 02/19/2020   AKI (acute kidney injury) (HCC) 12/19/2019   CAD (coronary artery disease) 12/19/2019   Hyperlipidemia with target LDL less than 70 12/19/2019   Wide-complex tachycardia 12/16/2019   Acute on chronic systolic CHF (congestive heart failure) (HCC) 12/16/2019   HTN (hypertension) 12/16/2019    Palliative Care Assessment & Plan   Patient Profile: 57 y.o. 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   Full assessment and workup; status post LVAD implant on 08/29/2022. Postop day 1 code stroke called, CT significant for acute right MCA infarct, status post thrombectomy.   Today's Discussion: Extensive chart review has been completed prior to meeting with patient including labs, vital signs, imaging, progress/consult notes, orders, medications and available advance directive documents.   Patient is resting in bed. He states he is "not doing good" and his mood seems depressed. He closes his eyes while we talk. His nurse reports he was able to sit up in the chair for a while this morning. His daughter and grandson will visit this afternoon. When I asked if I could do anything for him he responded, "pray for me." Will see if chaplain Alvino Chapel can visit this week.   Recommendations/Plan: Partial code status- no CPR due to LVAD Full scope Continued PMT  support  Code Status:    Code Status Orders  (From admission, onward)           Start     Ordered   08/29/22 1529  Limited resuscitation (code)  Continuous       Comments: Ventricular Assist Device in place.  Question Answer Comment  In the event of cardiac or respiratory ARREST: Initiate Code Blue, Call Rapid Response Yes   In the event of cardiac or respiratory ARREST: Perform CPR No   In the event of cardiac or respiratory ARREST: Perform Intubation/Mechanical Ventilation Yes   In the event of cardiac or respiratory ARREST: Use NIPPV/BiPAp only if indicated Yes   In the event of cardiac or respiratory ARREST: Administer ACLS medications if indicated Yes   In the event of cardiac or respiratory ARREST: Perform Defibrillation or Cardioversion if indicated Yes      08/29/22 1528            Care plan was discussed with bedside RN  Time spent: 25 minutes  Thank you for allowing the Palliative Medicine Team to assist in the care of this patient.   Sherryll Burger, NP  Please contact Palliative Medicine Team phone at [phone] for questions and concerns.

## 2022-09-10 NOTE — Progress Notes (Deleted)
PATIENT: Charles Holmes DOB: 04-Nov-1965  REASON FOR VISIT: follow up HISTORY FROM: patient PRIMARY NEUROLOGIST:   HISTORY OF PRESENT ILLNESS: Today 09/10/22  MARVELLE COLLINSWORTH is a 57 y.o. male Hospital follow-up for Right MCA infarct with hemorrhagic transformaton d/t right M1 occlusion s/p IR with TICI3. Returns today for follow-up.   HISTORY Stroke - right MCA infarcts with hemorrhagic transformation due to right M1 occlusion s/p IR with TICI3 Etiology:  cardioembolic source (PAF, LVAD, cardiomyopathy with low EF) Code Stroke CT head Acute infarct R temporal cortex and basal ganglia. No acute hemorrhage. ASPECTS 7.    CTA head & neck emergent R M1 occlusion.  CTP 12/102 S/p IR with TICI3 Repeat CT 8/14 Hemorrhagic transformation of evolving infarct in the right caudate nucleus with small volume intraventricular extension of hemorrhage. Repeat CT 8/15 unchanged extent of hemorrhage in right MCA distribution Repeat CT head 8/17 decreased intraparenchymal and intraventricular hemorrhage. No hydrocephalus or midline shift. Echo 8/8 EF 15%, LV severely decreased function, with global hypokinesis LDL 51 HgbA1c 5.5 VTE prophylaxis - SCDs Eliquis (apixaban) daily prior to admission, now on heparin IV Therapy recommendations:  CIR Disposition:  pending  REVIEW OF SYSTEMS: Out of a complete 14 system review of symptoms, the patient complains only of the following symptoms, and all other reviewed systems are negative.  ALLERGIES: Allergies  Allergen Reactions   Amiodarone     Severe tremors   Percocet [Oxycodone-Acetaminophen] Itching    HOME MEDICATIONS: Facility-Administered Medications Prior to Visit  Medication Dose Route Frequency Provider Last Rate Last Admin   0.9 %  sodium chloride infusion (Manually program via Guardrails IV Fluids)   Intravenous Once Andrey Farmer, PA-C       0.9 %  sodium chloride infusion  250 mL Intravenous PRN Lovett Sox, MD   Stopped  at 09/08/22 1239   acetaminophen (TYLENOL) 160 MG/5ML solution 650 mg  650 mg Oral Q4H PRN Bensimhon, Bevelyn Buckles, MD       albuterol (PROVENTIL) (2.5 MG/3ML) 0.083% nebulizer solution 2.5 mg  2.5 mg Nebulization Q4H PRN Lovett Sox, MD       aspirin chewable tablet 324 mg  324 mg Oral Daily Bensimhon, Bevelyn Buckles, MD   324 mg at 09/10/22 1026   atorvastatin (LIPITOR) tablet 80 mg  80 mg Oral Daily Bensimhon, Bevelyn Buckles, MD   80 mg at 09/10/22 1026   Chlorhexidine Gluconate Cloth 2 % PADS 6 each  6 each Topical Q0600 Lovett Sox, MD   6 each at 09/09/22 2000   dextrose 50 % solution 0-50 mL  0-50 mL Intravenous PRN Lovett Sox, MD       dorzolamide-timolol (COSOPT) 2-0.5 % ophthalmic solution 1 drop  1 drop Both Eyes BID Lovett Sox, MD   1 drop at 09/10/22 0930   EPINEPHrine (ADRENALIN) 5 mg in NS 250 mL (0.02 mg/mL) premix infusion  0-10 mcg/min Intravenous Titrated Lovett Sox, MD 12 mL/hr at 09/10/22 1200 4 mcg/min at 09/10/22 1200   feeding supplement (ENSURE ENLIVE / ENSURE PLUS) liquid 237 mL  237 mL Oral TID BM Bensimhon, Bevelyn Buckles, MD   237 mL at 09/09/22 1934   feeding supplement (PROSource TF20) liquid 60 mL  60 mL Per Tube BID Brynda Peon L, NP   60 mL at 09/10/22 1026   feeding supplement (VITAL 1.5 CAL) liquid 780 mL  780 mL Per Tube Q24H Sabharwal, Aditya, DO   Infusion Verify at 09/10/22 1100   fiber  supplement (BANATROL TF) liquid 60 mL  60 mL Per Tube TID Alen Bleacher, NP   60 mL at 09/10/22 1026   [COMPLETED] furosemide (LASIX) injection 40 mg  40 mg Intravenous Once Sabharwal, Aditya, DO   40 mg at 09/07/22 1828   furosemide (LASIX) injection 40 mg  40 mg Intravenous BID Sabharwal, Aditya, DO   40 mg at 09/10/22 1100   heparin ADULT infusion 100 units/mL (25000 units/256mL)  500 Units/hr Intravenous Continuous Mosetta Anis, RPH 5 mL/hr at 09/10/22 1248 500 Units/hr at 09/10/22 1248   insulin aspart (novoLOG) injection 0-24 Units  0-24 Units Subcutaneous TID WC & HS  Lovett Sox, MD   2 Units at 09/10/22 1124   latanoprost (XALATAN) 0.005 % ophthalmic solution 1 drop  1 drop Both Eyes QHS Lovett Sox, MD   1 drop at 09/09/22 2144   melatonin tablet 3 mg  3 mg Oral QHS Sabharwal, Aditya, DO   3 mg at 09/09/22 2143   meropenem (MERREM) 2 g in sodium chloride 0.9 % 100 mL IVPB  2 g Intravenous Q8H Sabharwal, Aditya, DO   Stopped at 09/10/22 0630   mexiletine (MEXITIL) capsule 250 mg  250 mg Oral BID Bensimhon, Bevelyn Buckles, MD   250 mg at 09/10/22 1026   milrinone (PRIMACOR) 20 MG/100 ML (0.2 mg/mL) infusion  0.375 mcg/kg/min Intravenous Continuous Bensimhon, Bevelyn Buckles, MD 9.74 mL/hr at 09/10/22 1246 0.375 mcg/kg/min at 09/10/22 1246   mometasone-formoterol (DULERA) 200-5 MCG/ACT inhaler 2 puff  2 puff Inhalation BID Lovett Sox, MD   2 puff at 09/09/22 1955   multivitamin with minerals tablet 1 tablet  1 tablet Oral Daily Bensimhon, Bevelyn Buckles, MD   1 tablet at 09/10/22 1026   ondansetron (ZOFRAN) injection 4 mg  4 mg Intravenous Q6H PRN Lovett Sox, MD       Oral care mouth rinse  15 mL Mouth Rinse 4 times per day Bensimhon, Bevelyn Buckles, MD   15 mL at 09/09/22 2139   Oral care mouth rinse  15 mL Mouth Rinse PRN Bensimhon, Bevelyn Buckles, MD       oxyCODONE (Oxy IR/ROXICODONE) immediate release tablet 5 mg  5 mg Oral Q4H PRN Bensimhon, Bevelyn Buckles, MD   5 mg at 09/07/22 1652   pantoprazole (PROTONIX) injection 40 mg  40 mg Intravenous QHS Bensimhon, Bevelyn Buckles, MD   40 mg at 09/09/22 2143   polyethylene glycol (MIRALAX / GLYCOLAX) packet 17 g  17 g Oral Daily PRN Bensimhon, Bevelyn Buckles, MD       QUEtiapine (SEROQUEL) tablet 50 mg  50 mg Oral QHS Brynda Peon L, NP   50 mg at 09/09/22 2143   sildenafil (REVATIO) tablet 20 mg  20 mg Per Tube TID Sabharwal, Aditya, DO   20 mg at 09/10/22 1026   sodium chloride (OCEAN) 0.65 % nasal spray 2 spray  2 spray Each Nare PRN Bensimhon, Bevelyn Buckles, MD       sodium chloride flush (NS) 0.9 % injection 3 mL  3 mL Intravenous Q12H Lovett Sox, MD   3 mL at 09/10/22 1026   sodium chloride flush (NS) 0.9 % injection 3 mL  3 mL Intravenous PRN Lovett Sox, MD       traMADol Janean Sark) tablet 50-100 mg  50-100 mg Oral Q4H PRN Lovett Sox, MD   100 mg at 09/04/22 1459   umeclidinium bromide (INCRUSE ELLIPTA) 62.5 MCG/ACT 1 puff  1 puff Inhalation Daily Lovett Sox, MD  1 puff at 09/09/22 0846   vancomycin (VANCOREADY) IVPB 750 mg/150 mL  750 mg Intravenous Q12H Sabharwal, Aditya, DO 150 mL/hr at 09/10/22 1200 Infusion Verify at 09/10/22 1200   warfarin (COUMADIN) tablet 4 mg  4 mg Oral ONCE-1600 Carney, Gwenlyn Found, RPH       Warfarin - Pharmacist Dosing Inpatient   Does not apply [id number] Lovett Sox, MD   Given at 09/08/22 1522   Outpatient Medications Prior to Visit  Medication Sig Dispense Refill   apixaban (ELIQUIS) 5 MG TABS tablet Take 1 tablet (5 mg total) by mouth 2 (two) times daily. 180 tablet 3   atorvastatin (LIPITOR) 80 MG tablet Take 1 tablet (80 mg total) by mouth daily. 90 tablet 3   carvedilol (COREG) 3.125 MG tablet Take 1 tablet (3.125 mg total) by mouth 2 (two) times daily. 180 tablet 3   dapagliflozin propanediol (FARXIGA) 10 MG TABS tablet Take 1 tablet (10 mg total) by mouth daily before breakfast. 90 tablet 3   dorzolamide-timolol (COSOPT) 22.3-6.8 MG/ML ophthalmic solution Place 1 drop into both eyes in the morning and at bedtime.     fluticasone-salmeterol (ADVAIR HFA) 230-21 MCG/ACT inhaler Inhale 2 puffs into the lungs 2 (two) times daily. 1 each 12   folic acid (FOLVITE) 1 MG tablet Take 1 tablet (1 mg total) by mouth daily. 90 tablet 3   furosemide (LASIX) 40 MG tablet Take 1 tablet (40 mg total) by mouth 2 (two) times daily. 90 tablet 3   latanoprost (XALATAN) 0.005 % ophthalmic solution Place 1 drop into both eyes at bedtime.     methotrexate (RHEUMATREX) 2.5 MG tablet Take 8 tablets (20 mg total) by mouth once a week. (Patient taking differently: Take 20 mg by mouth once a week. Saturdays)  96 tablet 3   mexiletine (MEXITIL) 250 MG capsule Take 1 capsule (250 mg total) by mouth 2 (two) times daily. 180 capsule 3   pantoprazole (PROTONIX) 40 MG tablet Take 40 mg by mouth daily before breakfast.     spironolactone (ALDACTONE) 25 MG tablet TAKE 1/2 TABLET BY MOUTH DAILY 45 tablet 0    PAST MEDICAL HISTORY: Past Medical History:  Diagnosis Date   CAD (coronary artery disease)    CHF (congestive heart failure) (HCC)    GERD (gastroesophageal reflux disease)    Hyperlipidemia    Hypertension    Systolic heart failure (HCC) 2021   LVEF 18%, RVEF 38% on cardiac MRI 12/19/2019. possible cardiac sarcoidosis.   Wide-complex tachycardia 2021   wears LifeVest    PAST SURGICAL HISTORY: Past Surgical History:  Procedure Laterality Date   BIV UPGRADE N/A 06/07/2021   Procedure: BIV ICD UPGRADE;  Surgeon: Regan Lemming, MD;  Location: [facility] INVASIVE CV LAB;  Service: Cardiovascular;  Laterality: N/A;   CLIPPING OF ATRIAL APPENDAGE Left 08/29/2022   Procedure: CLIPPING OF ATRIAL APPENDAGE;  Surgeon: Lovett Sox, MD;  Location: [facility] OR;  Service: Open Heart Surgery;  Laterality: Left;   IABP INSERTION N/A 08/25/2022   Procedure: IABP Insertion;  Surgeon: Dolores Patty, MD;  Location: [facility] INVASIVE CV LAB;  Service: Cardiovascular;  Laterality: N/A;   ICD IMPLANT N/A 02/20/2020   Procedure: ICD IMPLANT;  Surgeon: Regan Lemming, MD;  Location: [facility] INVASIVE CV LAB;  Service: Cardiovascular;  Laterality: N/A;   INSERTION OF IMPLANTABLE LEFT VENTRICULAR ASSIST DEVICE N/A 08/29/2022   Procedure: INSERTION OF IMPLANTABLE LEFT VENTRICULAR ASSIST DEVICE;  Surgeon: Lovett Sox, MD;  Location: [facility] OR;  Service:  Open Heart Surgery;  Laterality: N/A;   IR CT HEAD LTD  07/10/2022   IR CT HEAD LTD  08/30/2022   IR PERCUTANEOUS ART THROMBECTOMY/INFUSION INTRACRANIAL INC DIAG ANGIO  07/10/2022   IR PERCUTANEOUS ART THROMBECTOMY/INFUSION INTRACRANIAL INC DIAG ANGIO  08/30/2022   IR US GUIDE  VASC ACCESS LEFT  08/30/2022   IR US GUIDE VASC ACCESS RIGHT  07/10/2022   RADIOLOGY WITH ANESTHESIA N/A 07/10/2022   Procedure: RADIOLOGY WITH ANESTHESIA;  Surgeon: Radiologist, Medication, MD;  Location: [facility] OR;  Service: Radiology;  Laterality: N/A;   RADIOLOGY WITH ANESTHESIA N/A 08/30/2022   Procedure: IR WITH ANESTHESIA;  Surgeon: Julieanne Cotton, MD;  Location: [facility] OR;  Service: Radiology;  Laterality: N/A;   REMOVAL OF IMPELLA LEFT VENTRICULAR ASSIST DEVICE N/A 08/29/2022   Procedure: REMOVAL OF INTRA-AORTIC BALLON PUMP;  Surgeon: Lovett Sox, MD;  Location: [facility] OR;  Service: Open Heart Surgery;  Laterality: N/A;   RIGHT HEART CATH N/A 07/14/2022   Procedure: RIGHT HEART CATH;  Surgeon: Dolores Patty, MD;  Location: [facility] INVASIVE CV LAB;  Service: Cardiovascular;  Laterality: N/A;   RIGHT HEART CATH N/A 08/25/2022   Procedure: RIGHT HEART CATH;  Surgeon: Dolores Patty, MD;  Location: [facility] INVASIVE CV LAB;  Service: Cardiovascular;  Laterality: N/A;   RIGHT/LEFT HEART CATH AND CORONARY ANGIOGRAPHY N/A 12/16/2019   Procedure: RIGHT/LEFT HEART CATH AND CORONARY ANGIOGRAPHY;  Surgeon: [place], Peter M, MD;  Location: [facility] INVASIVE CV LAB;  Service: Cardiovascular;  Laterality: N/A;   RIGHT/LEFT HEART CATH AND CORONARY ANGIOGRAPHY N/A 08/10/2022   Procedure: RIGHT/LEFT HEART CATH AND CORONARY ANGIOGRAPHY;  Surgeon: Dolores Patty, MD;  Location: [facility] INVASIVE CV LAB;  Service: Cardiovascular;  Laterality: N/A;   TEE WITHOUT CARDIOVERSION N/A 08/29/2022   Procedure: TRANSESOPHAGEAL ECHOCARDIOGRAM;  Surgeon: Lovett Sox, MD;  Location: [facility] OR;  Service: Open Heart Surgery;  Laterality: N/A;   TOOTH EXTRACTION N/A 08/22/2022   Procedure: DENTAL RESTORATION/EXTRACTIONS;  Surgeon: Ocie Doyne, DMD;  Location: [facility] OR;  Service: Oral Surgery;  Laterality: N/A;    FAMILY HISTORY: Family History  Problem Relation Age of Onset   Hypertension Father    Heart disease Father    Heart disease  Mother     SOCIAL HISTORY: Social History   Socioeconomic History   Marital status: Divorced    Spouse name: Not on file   Number of children: Not on file   Years of education: Not on file   Highest education level: Not on file  Occupational History   Not on file  Tobacco Use   Smoking status: Former    Types: Cigarettes   Smokeless tobacco: Never  Vaping Use   Vaping status: Unknown  Substance and Sexual Activity   Alcohol use: Yes    Alcohol/week: 6.0 standard drinks of alcohol    Types: 6 Cans of beer per week   Drug use: Yes    Types: Marijuana    Comment: stopped using months ago d/t how it affected breathing   Sexual activity: Yes  Other Topics Concern   Not on file  Social History Narrative   Not on file   Social Determinants of Health   Financial Resource Strain: Low Risk  (07/12/2022)   Overall Financial Resource Strain (CARDIA)    Difficulty of Paying Living Expenses: Not hard at all  Food Insecurity: No Food Insecurity (08/13/2022)   Hunger Vital Sign    Worried About Running Out of Food in the Last Year: Never true  Ran Out of Food in the Last Year: Never true  Transportation Needs: No Transportation Needs (08/13/2022)   PRAPARE - Administrator, Civil Service (Medical): No    Lack of Transportation (Non-Medical): No  Physical Activity: Not on file  Stress: Not on file  Social Connections: Not on file  Intimate Partner Violence: Not At Risk (08/13/2022)   Humiliation, Afraid, Rape, and Kick questionnaire    Fear of Current or Ex-Partner: No    Emotionally Abused: No    Physically Abused: No    Sexually Abused: No      PHYSICAL EXAM  There were no vitals filed for this visit. There is no height or weight on file to calculate BMI.  Generalized: Well developed, in no acute distress   Neurological examination  Mentation: Alert oriented to time, place, history taking. Follows all commands speech and language fluent Cranial nerve  II-XII: Pupils were equal round reactive to light. Extraocular movements were full, visual field were full on confrontational test. Facial sensation and strength were normal. Uvula tongue midline. Head turning and shoulder shrug  were normal and symmetric. Motor: The motor testing reveals 5 over 5 strength of all 4 extremities. Good symmetric motor tone is noted throughout.  Sensory: Sensory testing is intact to soft touch on all 4 extremities. No evidence of extinction is noted.  Coordination: Cerebellar testing reveals good finger-nose-finger and heel-to-shin bilaterally.  Gait and station: Gait is normal. Tandem gait is normal. Romberg is negative. No drift is seen.  Reflexes: Deep tendon reflexes are symmetric and normal bilaterally.   DIAGNOSTIC DATA (LABS, IMAGING, TESTING) - I reviewed patient records, labs, notes, testing and imaging myself where available.  Lab Results  Component Value Date   WBC 22.5 (H) 09/10/2022   HGB 8.7 (L) 09/10/2022   HCT 28.1 (L) 09/10/2022   MCV 100.4 (H) 09/10/2022   PLT 471 (H) 09/10/2022      Component Value Date/Time   NA 131 (L) 09/10/2022 0346   NA 143 05/31/2021 0906   K 4.3 09/10/2022 0346   CL 96 (L) 09/10/2022 0346   CO2 26 09/10/2022 0346   GLUCOSE 196 (H) 09/10/2022 0346   BUN 32 (H) 09/10/2022 0346   BUN 12 05/31/2021 0906   CREATININE 0.74 09/10/2022 0346   CALCIUM 7.9 (L) 09/10/2022 0346   PROT 5.9 (L) 09/10/2022 0346   ALBUMIN 2.0 (L) 09/10/2022 0346   AST 146 (H) 09/10/2022 0346   ALT 138 (H) 09/10/2022 0346   ALKPHOS 159 (H) 09/10/2022 0346   BILITOT 0.9 09/10/2022 0346   GFRNONAA >60 09/10/2022 0346   Lab Results  Component Value Date   CHOL 90 08/11/2022   HDL 26 (L) 08/11/2022   LDLCALC 51 08/11/2022   TRIG 64 08/11/2022   CHOLHDL 3.5 08/11/2022   Lab Results  Component Value Date   HGBA1C 5.5 08/11/2022   Lab Results  Component Value Date   VITAMINB12 278 07/12/2022   Lab Results  Component Value Date    TSH 1.211 08/11/2022      ASSESSMENT AND PLAN 57 y.o. year old male  has a past medical history of CAD (coronary artery disease), CHF (congestive heart failure) (HCC), GERD (gastroesophageal reflux disease), Hyperlipidemia, Hypertension, Systolic heart failure (HCC) (4696), and Wide-complex tachycardia (2021). here with:  Stroke    *** : Residual deficit: ***. Continue {anticoagulants:31417}  and ***  for secondary stroke prevention.  Discussed secondary stroke prevention measures and importance of close PCP follow  up for aggressive stroke risk factor management. I have gone over the pathophysiology of stroke, warning signs and symptoms, risk factors and their management in some detail with instructions to go to the closest emergency room for symptoms of concern. HTN: BP goal <130/90.  Stable on *** per PCP HLD: LDL goal <70. Recent LDL ***.  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 Recent A1c ***.  Encouraged patient to monitor diet and encouraged exercise FU with our office ***      Butch Penny, MSN, NP-C 09/10/2022, 2:37 PM [facility] [address] [place], [place] [address] [phone]

## 2022-09-10 NOTE — Plan of Care (Signed)
Pt resistant to education.  Does not want to be involved with any teaching re: VAD.  Example, doing self test.

## 2022-09-10 NOTE — Progress Notes (Signed)
There was IV consult for PIV access. Patient has DL PICC on Rt. Arm. Lt arm is swollen. Placed PIV access on Lt. AFA, and informed patient's RN to monitor closely on the PIV site. HS [employer]

## 2022-09-11 ENCOUNTER — Inpatient Hospital Stay (HOSPITAL_COMMUNITY): Payer: Medicare Other

## 2022-09-11 ENCOUNTER — Inpatient Hospital Stay: Payer: Medicare Other | Admitting: Adult Health

## 2022-09-11 DIAGNOSIS — I5023 Acute on chronic systolic (congestive) heart failure: Secondary | ICD-10-CM | POA: Diagnosis not present

## 2022-09-11 DIAGNOSIS — R570 Cardiogenic shock: Secondary | ICD-10-CM | POA: Diagnosis not present

## 2022-09-11 DIAGNOSIS — Z95811 Presence of heart assist device: Secondary | ICD-10-CM

## 2022-09-11 DIAGNOSIS — I428 Other cardiomyopathies: Secondary | ICD-10-CM | POA: Diagnosis not present

## 2022-09-11 DIAGNOSIS — I501 Left ventricular failure: Secondary | ICD-10-CM

## 2022-09-11 LAB — MAGNESIUM
Magnesium: 2.2 mg/dL (ref 1.7–2.4)
Magnesium: 1.9 mg/dL (ref 1.7–2.4)

## 2022-09-11 LAB — COMPREHENSIVE METABOLIC PANEL
ALT: 150 U/L — ABNORMAL HIGH (ref 0–44)
AST: 143 U/L — ABNORMAL HIGH (ref 15–41)
Albumin: 2.3 g/dL — ABNORMAL LOW (ref 3.5–5.0)
Alkaline Phosphatase: 140 U/L — ABNORMAL HIGH (ref 38–126)
Anion gap: 8 (ref 5–15)
BUN: 33 mg/dL — ABNORMAL HIGH (ref 6–20)
CO2: 29 mmol/L (ref 22–32)
Calcium: 8.1 mg/dL — ABNORMAL LOW (ref 8.9–10.3)
Chloride: 96 mmol/L — ABNORMAL LOW (ref 98–111)
Creatinine, Ser: 0.75 mg/dL (ref 0.61–1.24)
GFR, Estimated: >60 mL/min (ref >60)
Glucose, Bld: 157 mg/dL — ABNORMAL HIGH (ref 70–99)
Potassium: 3.6 mmol/L (ref 3.5–5.1)
Sodium: 133 mmol/L — ABNORMAL LOW (ref 135–145)
Total Bilirubin: 1.1 mg/dL (ref 0.3–1.2)
Total Protein: 6.1 g/dL — ABNORMAL LOW (ref 6.5–8.1)

## 2022-09-11 LAB — COOXEMETRY PANEL
Carboxyhemoglobin: 2.4 % — ABNORMAL HIGH (ref 0.5–1.5)
Methemoglobin: <0.7 % (ref 0.0–1.5)
O2 Saturation: 63.4 %
Total hemoglobin: 9.4 g/dL — ABNORMAL LOW (ref 12.0–16.0)

## 2022-09-11 LAB — BASIC METABOLIC PANEL
Anion gap: 12 (ref 5–15)
BUN: 33 mg/dL — ABNORMAL HIGH (ref 6–20)
CO2: 30 mmol/L (ref 22–32)
Calcium: 8.1 mg/dL — ABNORMAL LOW (ref 8.9–10.3)
Chloride: 92 mmol/L — ABNORMAL LOW (ref 98–111)
Creatinine, Ser: 0.68 mg/dL (ref 0.61–1.24)
GFR, Estimated: >60 mL/min (ref >60)
Glucose, Bld: 147 mg/dL — ABNORMAL HIGH (ref 70–99)
Potassium: 3.3 mmol/L — ABNORMAL LOW (ref 3.5–5.1)
Sodium: 134 mmol/L — ABNORMAL LOW (ref 135–145)

## 2022-09-11 LAB — CBC
HCT: 28.1 % — ABNORMAL LOW (ref 39.0–52.0)
Hemoglobin: 8.6 g/dL — ABNORMAL LOW (ref 13.0–17.0)
MCH: 29.9 pg (ref 26.0–34.0)
MCHC: 30.6 g/dL (ref 30.0–36.0)
MCV: 97.6 fL (ref 80.0–100.0)
Platelets: 488 10*3/uL — ABNORMAL HIGH (ref 150–400)
RBC: 2.88 MIL/uL — ABNORMAL LOW (ref 4.22–5.81)
RDW: 17.3 % — ABNORMAL HIGH (ref 11.5–15.5)
WBC: 18.1 10*3/uL — ABNORMAL HIGH (ref 4.0–10.5)
nRBC: 0 % (ref 0.0–0.2)

## 2022-09-11 LAB — GLUCOSE, CAPILLARY
Glucose-Capillary: 107 mg/dL — ABNORMAL HIGH (ref 70–99)
Glucose-Capillary: 120 mg/dL — ABNORMAL HIGH (ref 70–99)
Glucose-Capillary: 123 mg/dL — ABNORMAL HIGH (ref 70–99)
Glucose-Capillary: 124 mg/dL — ABNORMAL HIGH (ref 70–99)
Glucose-Capillary: 125 mg/dL — ABNORMAL HIGH (ref 70–99)
Glucose-Capillary: 134 mg/dL — ABNORMAL HIGH (ref 70–99)
Glucose-Capillary: 138 mg/dL — ABNORMAL HIGH (ref 70–99)
Glucose-Capillary: 140 mg/dL — ABNORMAL HIGH (ref 70–99)
Glucose-Capillary: 160 mg/dL — ABNORMAL HIGH (ref 70–99)

## 2022-09-11 LAB — PROTIME-INR
INR: 1.3 — ABNORMAL HIGH (ref 0.8–1.2)
Prothrombin Time: 16.8 s — ABNORMAL HIGH (ref 11.4–15.2)

## 2022-09-11 LAB — LACTATE DEHYDROGENASE: LDH: 327 U/L — ABNORMAL HIGH (ref 98–192)

## 2022-09-11 LAB — ECHOCARDIOGRAM LIMITED
Height: 67 in
Weight: 2758.4 oz

## 2022-09-11 LAB — HEPARIN LEVEL (UNFRACTIONATED): Heparin Unfractionated: <0.1 [IU]/mL — ABNORMAL LOW (ref 0.30–0.70)

## 2022-09-11 LAB — BRAIN NATRIURETIC PEPTIDE: B Natriuretic Peptide: 933 pg/mL — ABNORMAL HIGH (ref 0.0–100.0)

## 2022-09-11 MED ORDER — FUROSEMIDE 10 MG/ML IJ SOLN
80.0000 mg | Freq: Two times a day (BID) | INTRAMUSCULAR | Status: DC
  Administered 2022-09-11 (×2): 80 mg via INTRAVENOUS
  Filled 2022-09-11 (×2): qty 8

## 2022-09-11 MED ORDER — SODIUM CHLORIDE 0.9 % IV SOLN: INTRAVENOUS | Status: DC

## 2022-09-11 MED ORDER — SORBITOL 70 % SOLN
30.0000 mL | Freq: Once | Status: AC
  Administered 2022-09-11: 30 mL via ORAL
  Filled 2022-09-11: qty 30

## 2022-09-11 MED ORDER — POTASSIUM CHLORIDE CRYS ER 20 MEQ PO TBCR
40.0000 meq | EXTENDED_RELEASE_TABLET | Freq: Two times a day (BID) | ORAL | Status: AC
  Administered 2022-09-11 (×2): 40 meq via ORAL
  Filled 2022-09-11 (×2): qty 2

## 2022-09-11 MED ORDER — ALBUMIN HUMAN 25 % IV SOLN
12.5000 g | Freq: Four times a day (QID) | INTRAVENOUS | Status: AC
  Administered 2022-09-11 (×2): 12.5 g via INTRAVENOUS
  Filled 2022-09-11 (×2): qty 50

## 2022-09-11 MED ORDER — WARFARIN SODIUM 5 MG PO TABS
5.0000 mg | ORAL_TABLET | Freq: Once | ORAL | Status: AC
  Administered 2022-09-11: 5 mg via ORAL
  Filled 2022-09-11: qty 1

## 2022-09-11 MED ORDER — DIGOXIN 125 MCG PO TABS
0.1250 mg | ORAL_TABLET | Freq: Every day | ORAL | Status: DC
  Administered 2022-09-11 – 2022-09-27 (×17): 0.125 mg via ORAL
  Filled 2022-09-11 (×17): qty 1

## 2022-09-11 NOTE — Progress Notes (Signed)
ANTICOAGULATION CONSULT NOTE  Pharmacy Consult for heparin + warfarin Indication:  LVAD  Allergies  Allergen Reactions   Amiodarone     Severe tremors   Percocet [Oxycodone-Acetaminophen] Itching    Patient Measurements: Height: 5\' 7"  (170.2 cm) Weight: 78.2 kg (172 lb 6.4 oz) IBW/kg (Calculated) : 66.1 Heparin Dosing Weight: 87kg  Vital Signs: Temp: 98.4 F (36.9 C) (08/26 0743) Temp Source: Oral (08/26 0743) BP: 93/81 (08/26 0800) Pulse Rate: 117 (08/26 1000)  Labs: Recent Labs    09/09/22 0516 09/10/22 0346 09/10/22 0350 09/10/22 0751 09/11/22 0500  HGB 8.5* 8.7*  --   --  8.6*  HCT 27.4* 28.1*  --   --  28.1*  PLT 425* 471*  --   --  488*  LABPROT 15.3* 17.5*  --   --  16.8*  INR 1.2 1.4*  --   --  1.3*  HEPARINUNFRC <0.10*  --  0.40 <0.10* <0.10*  CREATININE 0.77 0.74  --   --  0.75    Estimated Creatinine Clearance: 93 mL/min (by C-G formula based on SCr of 0.75 mg/dL).   Medical History: Past Medical History:  Diagnosis Date   CAD (coronary artery disease)    CHF (congestive heart failure) (HCC)    GERD (gastroesophageal reflux disease)    Hyperlipidemia    Hypertension    Systolic heart failure (HCC) 2021   LVEF 18%, RVEF 38% on cardiac MRI 12/19/2019. possible cardiac sarcoidosis.   Wide-complex tachycardia 2021   wears LifeVest     Assessment: 57yoM on apixaban PTA for hx AF admitted for LVAD workup. Pt s/p HM3 implant on 8/13 c/b acute CVA postop. Head CT 8/17 stable, ok to start low dose heparin per neuro, warfarin started 8/19.  Heparin level <0.1 as expected, CBC and LDH stable, INR remains low at 1.3.   Goal of Therapy:  Heparin level <0.1 units/ml Monitor platelets by anticoagulation protocol: Yes   Plan:  Continue Heparin 500 units/h - no up titrations  Warfarin 5mg  x1 tonight Daily INR, heparin level, CBC  Fredonia Highland, PharmD, [place], [facility] Clinical Pharmacist [phone] Please check AMION for all [facility]  numbers 09/11/2022

## 2022-09-11 NOTE — Progress Notes (Signed)
HeartMate 3 Rounding Note  POD #13 Implant HM-3  Subjective:   57 year old male with cardiac sarcoidosis, nonischemic cardiomyopathy, and advanced heart failure refractory to optimal medical therapy underwent HeartMate 3 implantation on August 13 and required preoperative support with intra-aortic balloon pump and inotropic therapy.  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 Off anticoagulation now  due to mild hemorrhagic extension by f/u CT.  Patient was started on IV antibiotics postop day 6 for elevated white count and fever.  Cultures are negative.  Chest x-ray shows only mild atelectasis no significant effusion.  Sternal incision looks clearing and dry.  Temperature curve and white count continue to improve.  Blood cultures sputum culture and urine cultures remain negative.  Chest x-ray improving with significant diuresis.He is close to preop weight.  Patient's left-sided weakness is being treated with physical therapy and his mobility is improving, able to stand and pivot to the chair. OOB to chair daily  PharmD is slowly loading coumadin with low dose heparin bridge.  INR currently 1.3, coumadin dose has been increased.  Wt down to 172 with preop wt 169. Epinephrine weaned down to  3-4 mcg.  LVAD INTERROGATION:  HeartMate II LVAD:  Flow 4.3  liters/min, speed 5300 rpm, power 3.6, PI 4.1.  Controller intact.   Objective:    Vital Signs:   Temp:  [98 F (36.7 C)-99.2 F (37.3 C)] 98.4 F (36.9 C) (08/26 0743) Pulse Rate:  [109-235] 110 (08/26 0800) Resp:  [16-26] 23 (08/26 0800) BP: (67-99)/(40-86) 93/81 (08/26 0800) SpO2:   [92 %-97 %] 95 % (08/26 0740) Weight:  [78.2 kg] 78.2 kg (08/26 0645) Last BM Date : 09/10/22 Mean arterial Pressure 88-92 mm Hg  Intake/Output:  Exam  General -alert moving L side better, speech better HEENT:-No bleeding from mouth at extraction sits Neck: supple. JVP . Carotids without bruits. No lymphadenopathy or thryomegaly appreciated. Cor: Mechanical heart sounds with LVAD hum present. Lungs: clear Abdomen: soft, nontender, nondistended. No hepatosplenomegaly. No bruits or masses. Good bowel sounds. Extremities: no cyanosis, clubbing, rash, edema Neuro: alert & orientedx3, cranial nerves grossly intact. moves all 4 extremities .Improving L arm,leg motor function.  Telemetry: paced with PPM 90/min  Labs: Basic Metabolic Panel: Recent Labs  Lab 09/05/22 0348 09/05/22 2217 09/07/22 0407 09/08/22 0408 09/09/22 0516 09/10/22 0346 09/11/22 0500  NA 132*   < > 134* 131* 133* 131* 133*  K 3.6   < > 3.8 3.9 4.2 4.3 3.6  CL 93*   < > 93* 94* 95* 96* 96*  CO2 30   < > 27 32 28 26 29   GLUCOSE 147*   < > 213* 181* 174* 196* 157*  BUN 25*   < > 28* 33* 32* 32* 33*  CREATININE 0.73   < > 0.75 0.95 0.77 0.74 0.75  CALCIUM 8.0*   < > 7.9* 7.7* 8.1* 7.9* 8.1*  MG 1.9  --  1.6* 2.1 2.0 2.0 2.2  PHOS 2.9  --   --   --   --   --   --    < > = values in this interval not displayed.    Liver Function Tests: Recent Labs  Lab 09/07/22 0407 09/08/22 0408 09/09/22 0516 09/10/22 0346 09/11/22 0500  AST 93* 123* 113* 146* 143*  ALT 72* 103* 103* 138* 150*  ALKPHOS 159* 174* 168* 159* 140*  BILITOT 1.0 1.5* 1.0 0.9 1.1  PROT 5.2* 5.4* 5.9* 5.9* 6.1*  ALBUMIN 1.7* 1.8* 2.2* 2.0* 2.3*   No results for input(s): "LIPASE", "AMYLASE" in the last 168 hours. No results for input(s): "AMMONIA" in the last 168 hours.  CBC: Recent Labs  Lab 09/07/22 0407 09/08/22 0408 09/09/22 0516 09/10/22 0346 09/11/22 0500  WBC 21.0* 24.6* 22.2* 22.5* 18.1*  HGB 8.6* 9.2* 8.5* 8.7* 8.6*  HCT  27.9* 29.4* 27.4* 28.1* 28.1*  MCV 100.4* 100.3* 101.5* 100.4* 97.6  PLT 435* 444* 425* 471* 488*    INR: Recent Labs  Lab 09/07/22 0447 09/08/22 0408 09/09/22 0516 09/10/22 0346 09/11/22 0500  INR 1.2 1.0 1.2 1.4* 1.3*    Other results: EKG:   Imaging: No results found.   Medications:     Scheduled Medications:  sodium chloride   Intravenous Once   aspirin  324 mg Oral Daily   atorvastatin  80 mg Oral Daily   Chlorhexidine Gluconate Cloth  6 each Topical Q0600   digoxin  0.125 mg Oral Daily   dorzolamide-timolol  1 drop Both Eyes BID   feeding supplement  237 mL Oral TID BM   feeding supplement (PROSource TF20)  60 mL Per Tube BID   feeding supplement (VITAL 1.5 CAL)  780 mL Per Tube Q24H   fiber supplement (BANATROL TF)  60 mL Per Tube TID   furosemide  80 mg Intravenous BID   insulin aspart  0-24 Units Subcutaneous TID WC & HS   latanoprost  1 drop Both Eyes QHS   melatonin  3 mg Oral QHS   mexiletine  250 mg Oral BID   mometasone-formoterol  2 puff Inhalation BID   multivitamin with minerals  1 tablet Oral Daily   mouth rinse  15 mL Mouth Rinse 4 times per day   pantoprazole (PROTONIX) IV  40 mg Intravenous QHS   potassium chloride  40 mEq Oral BID   QUEtiapine  50 mg Oral QHS   sildenafil  20 mg Per Tube TID   sodium chloride flush  3 mL Intravenous Q12H   umeclidinium bromide  1 puff Inhalation Daily   Warfarin - Pharmacist Dosing Inpatient   Does not apply q1600    Infusions:  sodium chloride Stopped (09/08/22 1239)   [START ON 09/12/2022] sodium chloride     epinephrine 4 mcg/min (09/11/22 0400)   heparin 500 Units/hr (09/11/22 0400)   meropenem (MERREM) IV 2 g (09/11/22 0713)   milrinone 0.375 mcg/kg/min (09/11/22 0400)   vancomycin Stopped (09/10/22 2313)    PRN Medications: sodium chloride, acetaminophen, albuterol, dextrose, ondansetron (ZOFRAN) IV, mouth rinse, oxyCODONE, polyethylene glycol, sodium chloride, sodium chloride flush,  traMADol   Assessment:  57 year old patient with sarcoidosis, COPD history of stroke  and nonischemic cardiomyopathy with preoperative cardiogenic shock requiring balloon pump and inotropic therapy.  Postoperative  right middle cerebral artery CVA treated with neuro-- radiology invention,   Postop RV dysfunction req inhaled NO and inotropes with incresad bilirubin 1.2>> 4.9 Now off inhaled NO and bilirubin improving Patient has been diuresed down to preop weight. Still requiring low-dose epinephrine and milrinone. Repeat echo and right heart cath to assess RV function. Plan/Discussion:   RV dysfunction status post VAD implant-continue low-dose epinephrine, wean slowly- now < 5 mcg  Postoperative right  middle cerebral CVA.  Initial hemorrhagic extension was mild and has started to improve. Low dose heparin started.  Coumadin directedby PharmD  Tube feeds at night, encourage PO intake- swallow function ok after CVA  Continue IV vancomycin meropenem at least 1 week for fever and elevated white count. WBC level at 18k- surgical incisions clean, dry.  Foley and all OR lines out   Remove EPWs when off iv heparin, INR reasonable  I reviewed the LVAD parameters from today, and compared the results to the patient's prior recorded data.  No programming changes were made.  The LVAD is functioning within specified parameters.  The patient performs LVAD self-test daily.  LVAD interrogation was negative for any significant power changes, alarms or PI events/speed drops.  LVAD equipment check completed and is in good working order.  Back-up equipment present.   LVAD education done on emergency procedures and precautions and reviewed exit site care.  Length of Stay: [address]  Lovett Sox 09/11/2022, 8:52 AM

## 2022-09-11 NOTE — Progress Notes (Signed)
LVAD Coordinator Rounding Note:  Admitted 08/10/22 due to acute on chronic CHF with cardiogenic shock. Milrinone dependent. Advance therapy workup completed, and pt deemed acceptable VAD candidate. Dental extractions completed 8/6. IABP placed 08/25/22.  HM 3 LVAD implanted on 08/29/22 by Dr Donata Clay under destination therapy criteria. 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 Result negative.  7/25 Admit with cardiogenic shock. Started milrinone and NE. 8/6 S/P 13 teeth extractions  8/9 IABP placed 8/13 S/p HM III LVAD implant + clipping left atrial appendage d/t severe thickening and invagination of mitral valve annulus impeding flows  CT Head 8/14 (initial) 1. 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 ASPECTS is 7. 2. No acute hemorrhage.  CT Angio Head/Neck 1. 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 2. Core infarct of 12 cc (somewhat underestimated compared to aspects) with 90 cc of penumbra. 3. Mild atherosclerosis.  Pt taken emergently to IR for percutaneous right common carotid arteriogram with thrombectomy. Revascularization achieved. Angio-seal closure device applied to left groin- clean, dry, and intact.   CT Head 8/15 @ 0450 Unchanged extent of infarct and hemorrhage in the right MCA distribution including small volume intraventricular clot. No hydrocephalus.  CT Head 8/17 @ 0701 Interval evolution of the right MCA territory infarct with decreased intraparenchymal and intraventricular hemorrhage. No hydrocephalus or midline shift.   Pt lying in bed on my arrival. Pt on Sildenafil, Epi down to 4 mcg/min Pt progressing with PT/OT/SLP. CIR consulted once medically stable.   Pt is posted for RHC tomorrow at 1000 am.  Vital signs: Temp: 98.4 HR: 111 Doppler Pressure: 86 Automatic BP: 93/81 (87) O2 Sat: 93% on RA Wt: 183.6>191.1>190.9>190.2>184.8>178.8>178.1>172.4lbs   LVAD interrogation reveals:  Speed:  5300 Flow: 4.5 Power: 3.7 w PI: 2.9  Alarms: none Events: none  Hematocrit: 29 Fixed speed: 5300 Low speed limit: 5000  Drive Line:  Existing VAD dressing removed and site care performed using sterile technique. Drive line exit site cleaned with Chlora prep applicators x 2, allowed to dry, and silver strip with gauze applied. Exit site healing and unincorporated, the velour is fully implanted at exit site.  Moderate amount of serous drainage. Slight redness, no tenderness, foul odor or rash noted. 2 suture in place. Drive line anchor secure. Pt has some skin breakdown from tape and 2 blisters...these areas were left OTA. Continue Monday/ Wednesday/Friday dressing changes.  Next dressing change due 09/13/22 by VAD coordinator or nurse champion only.       Labs:  LDH trend: 596>441>165>384>345>312>312>350>327  INR trend: 1.4>1.6>1.1>1.1>1.3>1.2>1>1.3  AST/ALT trend: 218/45>96/30>73/23>63/36>58/37>69/50>93/72>123/103>143/150  Total Bili trend: 4.4>4.6>5.1>1.8>1.9>1.2>1.0>1.5>1.1  WBC trend: 11.6>13.7>19.6>16.1>23>26.6>21>24.6>18.1  Anticoagulation Plan: -INR Goal: 2.0 - 2.5 -ASA Dose: 325 mg  -Heparin 500 u/hr - Coumadin dosing per pharmacy  Blood Products:  IntraOp 8/14: - 4 FFP - 2 Platelets - 2 PRBC - 1 cyro - 449 cc of cellsaver - DDAVP 20 mcg x 1   Device: [employer] BiV -  -Therapies: OFF  Arrythmias:   Respiratory: RA  Infection:  09/01/22>> sputum cx>> NGTD>>final 09/01/22>> blood cx>> NGTD>>final 09/01/22>> urine cx>> NGTD>> final  Renal:  8/22: BUN/CRT: 28/0.75  Adverse Events on VAD: - 08/30/22: - Developed left sided weakness this am. CTA with R MCA infarct. Taken to IR for thrombectomy   Drips:  Epinephrine 4 mcg/min Milrinone 0.375 mcg/kg/min  Patient Education: Discussed and demonstrated power source change with pt. Left extremity too weak at this time to perform power change  on his own.  No family at bedside.   Plan/Recommendations:   Please page VAD coordinator for any alarms or VAD equipment issues. Continue MWF dressing changes by VAD coordinator or Nurse Phyllis Ginger RN,BSN VAD Coordinator  Office: [phone]  24/7 Pager: [phone]

## 2022-09-11 NOTE — Progress Notes (Signed)
Physical Therapy Treatment Patient Details Name: Charles Holmes MRN: [id number] DOB: 1965-03-03 Today's Date: 09/11/2022   History of Present Illness Pt is 57 year old presented to [facility] on  08/10/22 for acute on chronic systolic heart failure. Pt had IABP placed on 08/25/22 for ongoing cardiogenic shock. Underwent placement of LVAD on 8/13. Developed lt sided weakness on 8/14. Rt MCA CVA. Underwent mechanical thrombectomy by IR. Extubated 8/15.  PMH - CAD, CHF, HLD, HTN, systolic HF, ICD implant, rt CVA.  LVAD placement and new stroke R MCA M1.    PT Comments  Pt with depressed spirits today that required max encouragement to participate in therapy this date. Pt continues with L hemiparesis UE > LE. Pt very frustrated to impaired speech, L UE weakness, back pain in standing, and prolonged hospital stay. Pt also with noted impaired memory and mild confusion. Pt continues to progress with his transfers however ambulation very limited due to L hemiparesis and inability to use bilat UEs to support self due to sternal precautions. Acute PT to cont to follow. Current d/c recommendation remains appropriate.    If plan is discharge home, recommend the following:     Can travel by private vehicle        Equipment Recommendations  Other (comment) (To be determined)    Recommendations for Other Services       Precautions / Restrictions Precautions Precautions: Sternal;Fall Precaution Comments: LVAD Restrictions Weight Bearing Restrictions: Yes (Sternal Precautions) RUE Weight Bearing: Non weight bearing RLE Weight Bearing: Non weight bearing Other Position/Activity Restrictions: Sternal Precautions     Mobility  Bed Mobility Overal bed mobility: Needs Assistance Bed Mobility: Supine to Sit   Sidelying to sit: Mod assist, +2 for physical assistance       General bed mobility comments: HOB elevated, unable to use L UE to assist due to precautions and hemiparesis, pt did attempt to roll onto  R elbow, modA for trunk elevation    Transfers Overall transfer level: Needs assistance   Transfers: Sit to/from Stand, Bed to chair/wheelchair/BSC Sit to Stand: Mod assist (minA from stedy seat)   Step pivot transfers:  (used stedy)       General transfer comment: after max encouragement pt agreed to get up, pt modAx1 to power up into stedy due to inability to pull up with UEs due to sternal precautions, completed 3 sit to stands up into stedy, assist to place L UE on grab bar once in standing Transfer via Lift Equipment: Stedy  Ambulation/Gait                   Stairs             Wheelchair Mobility     Tilt Bed    Modified Rankin (Stroke Patients Only) Modified Rankin (Stroke Patients Only) Pre-Morbid Rankin Score: No symptoms Modified Rankin: Moderately severe disability     Balance Overall balance assessment: Needs assistance Sitting-balance support: Feet supported, Single extremity supported Sitting balance-Leahy Scale: Fair Sitting balance - Comments: Pt can sit using right UE for support and contact guard assist   Standing balance support: Single extremity supported, Bilateral upper extremity supported, During functional activity Standing balance-Leahy Scale: Poor Standing balance comment: reliant on external support and Stedy, standing tolerance limited by onset of low back pain however was able to achieve full upright posture  Cognition Arousal: Alert Behavior During Therapy: Flat affect Overall Cognitive Status: Impaired/Different from baseline (NT formally) Area of Impairment: Problem solving, Awareness, Following commands, Safety/judgement, Memory                     Memory: Decreased recall of precautions (requires constant verbal/tactile cues to adhere to sternal precautions functionally) Following Commands: Follows one step commands with increased time Safety/Judgement: Decreased awareness  of safety, Decreased awareness of deficits Awareness: Emergent Problem Solving: Slow processing, Requires verbal cues, Difficulty sequencing General Comments: pt with mild confusion and noted impaired memory. Pt's friend came to visit and he doesn't remember her coming the several times she has, pt soft spoken and noted dysarthria frustrating pt during conversation, pt with depressed spirits today        Exercises Other Exercises Other Exercises: worked on marching in stedy, 10 reps x 2 sets    General Comments General comments (skin integrity, edema, etc.): VSS      Pertinent Vitals/Pain Pain Assessment Pain Assessment: No/denies pain    Home Living                          Prior Function            PT Goals (current goals can now be found in the care plan section) Acute Rehab PT Goals PT Goal Formulation: With patient Time For Goal Achievement: 09/14/22 Potential to Achieve Goals: Fair Progress towards PT goals: Progressing toward goals    Frequency    Min 1X/week      PT Plan Current plan remains appropriate    Co-evaluation              AM-PAC PT "6 Clicks" Mobility   Outcome Measure  Help needed turning from your back to your side while in a flat bed without using bedrails?: A Lot Help needed moving from lying on your back to sitting on the side of a flat bed without using bedrails?: A Lot Help needed moving to and from a bed to a chair (including a wheelchair)?: Total Help needed standing up from a chair using your arms (e.g., wheelchair or bedside chair)?: Total Help needed to walk in hospital room?: Total Help needed climbing 3-5 steps with a railing? : Total 6 Click Score: 8    End of Session Equipment Utilized During Treatment: Oxygen Activity Tolerance: Patient limited by fatigue Patient left: with call bell/phone within reach;in chair;with chair alarm set;with family/visitor present Nurse Communication: Mobility status PT Visit  Diagnosis: Other abnormalities of gait and mobility (R26.89);Difficulty in walking, not elsewhere classified (R26.2);Hemiplegia and hemiparesis Hemiplegia - Right/Left: Left Hemiplegia - dominant/non-dominant: Non-dominant Hemiplegia - caused by: Cerebral infarction     Time: 1030-1100 PT Time Calculation (min) (ACUTE ONLY): 30 min  Charges:    $Therapeutic Exercise: 8-22 mins $Therapeutic Activity: 8-22 mins PT General Charges $$ ACUTE PT VISIT: 1 Visit                     [name], PT, DPT Acute Rehabilitation Services Secure chat preferred Office #: [phone]    [name] 09/11/2022, 1:43 PM

## 2022-09-11 NOTE — Progress Notes (Signed)
CSW attempted a visit at bedside although patient was sleeping. No family at bedside at the time of visit. CSW continues to follow throughout implant hospitalization. Lasandra Beech, LCSW, CCSW-MCS [phone]

## 2022-09-11 NOTE — Plan of Care (Signed)
  Problem: Education: Goal: Knowledge of General Education information will improve Description Including pain rating scale, medication(s)/side effects and non-pharmacologic comfort measures Outcome: Progressing   

## 2022-09-11 NOTE — Progress Notes (Signed)
Patient ID: Charles Holmes, male   DOB: [birth year], 57 y.o.   MRN: [id number]     Advanced Heart Failure Rounding Note  PCP-Cardiologist: Norman Herrlich, MD  [facility]: Dr. Gala Romney   Subjective:    7/25: Admit with cardiogenic shock. Started milrinone and NE. 8/6 S/P 13 teeth extractions  8/9 IABP placed 8/13 S/p HM III LVAD implant + clipping left atrial appendage d/t severe thickening and invagination of mitral valve annulus impeding flows 8/14 Left-sided hemiplegia. CT head with acute R MCA infarct. IR for thrombectomy. CT head with small to moderate area of hemorrhagic conversion 8/15 extubated  8/17 Low dose heparin gtt restarted, CT head with decreased size of hemorrhagic stroke.  8/18 Neurology signed off 8/19: Limited echo: Mod RV dysfunction, better mitral inflow into the LV and VAD position satisfactory.  Post Op Day #12  Epinephrine remains at 4, increased slightly overnight due to low MAP.  MAP generally in 70s now.  Milrinone 0.375.  Co-ox 63% this morning.  Diuresed with Lasix 40 mg IV bid, net + 222 but weight down 2 lbs. CVP 13 on my read.   LVAD Interrogation HM III: Speed: 5300 Flow: 4.6  PI: 2.7 Power: 3.9.    Objective:   Weight Range: 79.4 kg Body mass index is 27.42 kg/m.   Vital Signs:   Temp:  [98 F (36.7 C)-99.2 F (37.3 C)] 99.2 F (37.3 C) (08/26 0348) Pulse Rate:  [112-235] 118 (08/25 1200) Resp:  [16-29] 21 (08/26 0545) BP: (67-99)/(40-86) 94/71 (08/26 0534) SpO2:  [92 %-97 %] 95 % (08/26 0430) Weight:  [79.4 kg] 79.4 kg (08/25 0700) Last BM Date : 09/10/22  Weight change: Filed Weights   09/08/22 0500 09/09/22 0630 09/10/22 0700  Weight: 80.8 kg 80.4 kg 79.4 kg    Intake/Output:   Intake/Output Summary (Last 24 hours) at 09/11/2022 0651 Last data filed at 09/11/2022 0400 Gross per 24 hour  Intake 3354.56 ml  Output 2800 ml  Net 554.56 ml   Physical Exam  CVP 13 General: Well appearing this am. NAD.  HEENT: Normal. Neck: Supple,  JVP 12 cm. Carotids OK.  Cardiac:  Mechanical heart sounds with LVAD hum present.  Lungs:  CTAB, normal effort.  Abdomen:  NT, ND, no HSM. No bruits or masses. +BS  LVAD exit site: Well-healed and incorporated. Dressing dry and intact. No erythema or drainage. Stabilization device present and accurately applied. Driveline dressing changed daily per sterile technique. Extremities:  Warm and dry. No cyanosis, clubbing, rash, or edema.  Neuro:  Alert & oriented x 3. Cranial nerves grossly intact. Moves all 4 extremities w/o difficulty. Affect pleasant    Telemetry     ST with BiV pacing 100s (personally reviewed)   Labs    CBC Recent Labs    09/10/22 0346 09/11/22 0500  WBC 22.5* 18.1*  HGB 8.7* 8.6*  HCT 28.1* 28.1*  MCV 100.4* 97.6  PLT 471* 488*    Basic Metabolic Panel Recent Labs    13/24/40 0346 09/11/22 0500  NA 131* 133*  K 4.3 3.6  CL 96* 96*  CO2 26 29  GLUCOSE 196* 157*  BUN 32* 33*  CREATININE 0.74 0.75  CALCIUM 7.9* 8.1*  MG 2.0 2.2    BNP: BNP (last 3 results) Recent Labs    08/10/22 1142 08/30/22 0258 09/04/22 2144  BNP 1,905.0* 922.3* 1,050.6*     Imaging    No results found.   Medications:     Scheduled Medications:  sodium chloride   Intravenous Once   aspirin  324 mg Oral Daily   atorvastatin  80 mg Oral Daily   Chlorhexidine Gluconate Cloth  6 each Topical Q0600   dorzolamide-timolol  1 drop Both Eyes BID   feeding supplement  237 mL Oral TID BM   feeding supplement (PROSource TF20)  60 mL Per Tube BID   feeding supplement (VITAL 1.5 CAL)  780 mL Per Tube Q24H   fiber supplement (BANATROL TF)  60 mL Per Tube TID   furosemide  40 mg Intravenous BID   insulin aspart  0-24 Units Subcutaneous TID WC & HS   latanoprost  1 drop Both Eyes QHS   melatonin  3 mg Oral QHS   mexiletine  250 mg Oral BID   mometasone-formoterol  2 puff Inhalation BID   multivitamin with minerals  1 tablet Oral Daily   mouth rinse  15 mL Mouth Rinse 4  times per day   pantoprazole (PROTONIX) IV  40 mg Intravenous QHS   QUEtiapine  50 mg Oral QHS   sildenafil  20 mg Per Tube TID   sodium chloride flush  3 mL Intravenous Q12H   umeclidinium bromide  1 puff Inhalation Daily   Warfarin - Pharmacist Dosing Inpatient   Does not apply q1600    Infusions:  sodium chloride Stopped (09/08/22 1239)   epinephrine 4 mcg/min (09/11/22 0400)   heparin 500 Units/hr (09/11/22 0400)   meropenem (MERREM) IV Stopped (09/10/22 2157)   milrinone 0.375 mcg/kg/min (09/11/22 0400)   vancomycin Stopped (09/10/22 2313)    PRN Medications: sodium chloride, acetaminophen, albuterol, dextrose, ondansetron (ZOFRAN) IV, mouth rinse, oxyCODONE, polyethylene glycol, sodium chloride, sodium chloride flush, traMADol   Patient Profile  Charles Holmes is a 57 y.o. 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 Admitted with cardiogenic shock, stabilized and underwent HM3 LVAD. Post implant course c/b acute CVA.   Assessment/Plan  1.  Acute on chronic Systolic HF-->Cardiogenic Shock  - Diagnosed 11/2019. Presented with VT. LHC 70% LAD  - cMRI 12/21 concerning for sarcoid and EF 18%.  - PET 2/22 at [facility] EF 25% + active sarcoid - Echo 08/26/20 EF < 20% severely dilated LV RV mildly decreased.  - [employer] CRT-D upgrade in 06/08/21 - Echo 07/10/22: EF <20%, RV okay, mod pericardial effusion, mod Charles/TR - Admitted 07/25 with cardiogenic shock. - RHC: Nonobstructive CAD, severely elevated filling pressures and low Fick CO/CI (2.7/1.4) - 08/13 HM III LVAD implant + clipping LAA d/t severe thickening and invagination of mitral valve annulus impeding flows.  - Apical core sent to pathology to confirm diagnosis of cardiac sarcoid >> pending - Speed increased to 5300 on 08/14.  - This morning on epinephrine 4 mcg & milrinone 0.313mcg/kg/min. RV failure is slowing inotrope wean.  Add digoxin, continue sildenafil 20 tid.  Will arrrange for echo today, maybe RHC tomorrow depending on progress.  - CVP of 13; +222 cc yesterday but weight down 2 lbs. Will diurese with IV lasix 80 mg BID today.  - ASA 325 + heparin gtt + warfarin, INR still low 1.3.   2.  Acute stroke - Hx CVA 06/24 -Admitted 06/24 w/ R MCA stroke. S/p TPA and mechanical clot extraction. No residual deficits. Likely cardioembolic in setting of severe LV dysfunction. - Developed left sided weakness 08/14. CTA with R MCA infarct. Taken to IR for thrombectomy - Repeat CT head with small to  moderate size hemorrhagic conversion. CT head this am stable. - D/w neuro, repeat head CT on 8/17 w/ improved hemorrhagic CVA, now back on low dose heparin per neurology.  - Back on warfarin - neurology signed off 09/03/22.  - C/w PT/OT.  - Continue ASA 325  - Has Cor-Trak and still getting nocturnal tube feeds, intake improving. On dysphagia 1 diet. Dietitian following   - will need CIR once medically stable for d/c (team following)   3. 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 Continue mexiletine  - now s/p ICD.   4. CAD - LHC 12/07/19 70-% LAD, no intervention - LHC 8/24 non obstructive CAD.  - Continue statin. On aspirin for VAD.  5. Cardiac sarcoid - PET 2/22 at [facility] EF 25% + active sarcoid - Has completed prednisone.  - holding methotrexate w/ recent surgery  - Sent apical core to pathology to confirm diagnosis of cardiac sarcoid => not diagnostic of cardiac sarcoidosis.   6. 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 AKI - suspect cardiorenal, improved w/ inotropic support - Creatinine stable.  - follow BMP w/ diuresis    8. Iron deficiency anemia/ Post-op anemia - recent T sat 15%, scheduled for OP feraheme. Will complete inpatient  - Transfused 1 u RBCs 8/15 - Hgb 8.6 today.    9. Pulmonary  - PFTs with severe obstructive defect, response to bronchodilator. FEV1 1.04L, FEV1/FVC 48% - extubated 8/15  -  Cultures NGTD.   10. Elevated LFTs - AST/ALT stably elevated, normal bilirubin.   11. ID - WBC ct 22.5 => 18. Will plan to D/C antibiotics possibly on 09/12/22; discussed with pharmd. - cont Merrem/vanc  - Cultures with NGTD - Incentive spirometer and flutter valve at bedside, encouraged to use  Continue to mobilize with PT.   CRITICAL CARE Performed by: Marca Ancona  Total critical care time: 40 minutes  Critical care time was exclusive of separately billable procedures and treating other patients.  Critical care was necessary to treat or prevent imminent or life-threatening deterioration.  Critical care was time spent personally by me on the following activities: development of treatment plan with patient and/or surrogate as well as nursing, discussions with consultants, evaluation of patient's response to treatment, examination of patient, obtaining history from patient or surrogate, ordering and performing treatments and interventions, ordering and review of laboratory studies, ordering and review of radiographic studies, pulse oximetry and re-evaluation of patient's condition.    Length of Stay: 60  Marca Ancona, MD  09/11/2022, 6:51 AM  VAD Team Pager [phone] (7am - 7am)   Advanced Heart Failure Team Pager [phone] (M-F; 7a - 5p)  Please contact [facility] for night-coverage after hours (5p -7a ) and weekends on [email or web address]

## 2022-09-11 NOTE — Progress Notes (Signed)
Patient ID: Charles Holmes, male   DOB: [birth year], 57 y.o.   MRN: [id number]  Echo done at bedside.  Moderate-severe RV dysfunction but RV not enlarged.  Interventricular septum does bow towards towards the right. I increased speed to 5400 rpm, flow rose to 4.9 L/min.  Interventricular septum remained stable bowing slightly towards the right.   I will not increase any further.  Will watch at this speed to see how he does, gradually try to titrate down epinephrine.   CRITICAL CARE Performed by: Marca Ancona  Total critical care time: 30 minutes  Critical care time was exclusive of separately billable procedures and treating other patients.  Critical care was necessary to treat or prevent imminent or life-threatening deterioration.  Critical care was time spent personally by me on the following activities: development of treatment plan with patient and/or surrogate as well as nursing, discussions with consultants, evaluation of patient's response to treatment, examination of patient, obtaining history from patient or surrogate, ordering and performing treatments and interventions, ordering and review of laboratory studies, ordering and review of radiographic studies, pulse oximetry and re-evaluation of patient's condition.  Marca Ancona 09/11/2022 10:04 AM

## 2022-09-11 NOTE — Progress Notes (Signed)
Inpatient Rehab Admissions Coordinator:  Saw pt at bedside. Informed him that we continue to monitor medical workup and progress with therapies. Pt is not medically ready for CIR admission. Will continue to follow.   Wolfgang Phoenix, MS, CCC-SLP Admissions Coordinator [phone]

## 2022-09-11 NOTE — Progress Notes (Signed)
Nutrition Follow-up  DOCUMENTATION CODES:   Not applicable  INTERVENTION:   Continue informal calorie count  Discussed diet consistency with SLP. Pt requesting items that are not allowed on Dysphagia 2 (rice, spaghetti, etc) and does not want any more mashed potatoes. Very limited menu options at this point. SLP plan to evaluate appropriateness of diet advancement to at least Dysphagia 3  Continue Ensure Enlive po TID, each supplement provides 350 kcal and 20 grams of protein.  Continue Magic cup BID with meals, each supplement provides 290 kcal and 9 grams of protein  Tube Feeding: continue 12 hour Nocturnal TF Vital 1.5 at 65 ml/hr x 12 hours Pro-Source TF20 60 mL BID TF provides 93 g of protein, 1330 kcals, 593 mL of free water  NUTRITION DIAGNOSIS:   Inadequate oral intake related to catabolic illness, poor appetite as evidenced by per patient/family report.  Being addressed  GOAL:   Patient will meet greater than or equal to 90% of their needs  Progressing  MONITOR:   PO intake, Supplement acceptance, Labs, Weight trends  REASON FOR ASSESSMENT:   Consult LVAD Eval  ASSESSMENT:   57 yo male admitted from cardiologist office with worsening fatigue, shortness of breath and poor appetite with acute on chronic HF with significant reduced LV dysfunction (EF 20%) and preserved RV function. Pt being evaluated for possible LVAD placement. PMH includes cardiac and pulmonary sarcoidosis, CAD, GERD, HLD, HTN. Noted recent hospitalization from 6/24-6/30 this year with acute emoblic CVA.  7/25 Admitted 7/26 LVAD work-up initiated 8/06 Dental extraction (13 teeth) 8/09 IABP placed 8/13 HM3 LVAD implanted, IABP removed 8/14 Early AM-Code Stroke, CT with acute R MCA stroke with R MCA M1 occlusion requiring thrombectomy 8/15 Extubated 8/17 Repeat CT head with improved hemorrhagic CVA 8/21 Changed to 12 hour nocturnal TF  Remains on epinephrine at 4  Pt sitting up in chair  on visit today; pt eating lunch, assisted by family friend. Today pt engages with RD more, more conversant  Pt eating puree fruit (frozen at the time), getting ready to try some Magic Cup. Pt reports he does not like the food he is being sent. Pt reports he does not want any more mashed potatoes as he reports he gets them every meal. Pt received Meat Loaf at lunch which he states he does not eat. RN called down for new tray but many of the items that pt wanting are not allowed on Dysphagia 2 diet. Pt requesting spaghetti (not allowed), settled for chicken tenders but wanted rice on the side (not allowed either).   Pt reports he still likes the Ensure supplements, unclear how much he is drinking   Some meal tray tickets available from the weekend. Pt eating small amounts from meal tray, likes the fruit. Noted recorded meal completion of 0% at all 3 meals on Sunday 8/25.   Labs: reviewed Meds: reviewed   Diet Order:   Diet Order             Diet NPO time specified Except for: Sips with Meds  Diet effective midnight           DIET DYS 2 Room service appropriate? Yes with Assist; Fluid consistency: Thin  Diet effective now                   EDUCATION NEEDS:   Not appropriate for education at this time  Skin:  Skin Assessment: Skin Integrity Issues: Skin Integrity Issues:: Incisions Incisions: new LVAD-driveline  Last  BM:  8/26 multiple type 6 small  Height:   Ht Readings from Last 1 Encounters:  08/31/22 5\' 7"  (1.702 m)    Weight:   Wt Readings from Last 1 Encounters:  09/11/22 78.2 kg    Ideal Body Weight:     BMI:  Body mass index is 27 kg/m.  Estimated Nutritional Needs:   Kcal:  2000-2300 kcals  Protein:  110-125 g  Fluid:  1.8 L   Romelle Starcher MS, RDN, LDN, CNSC Registered Dietitian 3 Clinical Nutrition RD Pager and On-Call Pager Number Located in [place]

## 2022-09-11 NOTE — Progress Notes (Signed)
SLP Note  Patient Details Name: Charles Holmes MRN: [id number] DOB: 11-Jan-1966   Couldn't visit pt at bedside today, but RD let me know he's hoping for a greater variety of foods. Ok to advance to dys 3; texture limitation more due to new dental extraction and pt comfort. He can avoid foods he feeds are too hard. Will advance and f/u   [name], Riley Nearing 09/11/2022, 2:49 PM

## 2022-09-11 NOTE — Progress Notes (Addendum)
Occupational Therapy Treatment Patient Details Name: Charles Holmes MRN: [id number] DOB: 1965/11/24 Today's Date: 09/11/2022   History of present illness Pt is 57 year old [facility]  [facility]22 for acute on chronic systolic heart failure. Pt had IABP placed on 08/25/22 for ongoing cardiogenic shock. Underwent placement of LVAD on 8/13. Developed lt sided weakness on 8/14. Rt MCA CVA. Underwent mechanical thrombectomy by IR. Extubated 8/15.  PMH - CAD, CHF, HLD, HTN, systolic HF, ICD implant, rt CVA.  LVAD placement and new stroke R MCA M1.   OT comments  Pt progressing towards goals, able to tolerate 1.5 hours of being up in chair. Pt able to transfer chair > bed with mod A +2 with 2 person HHA, cues for sternal precautions throughout. Pt max A +2 for bed mobility to assist/guide trunk and BLE's into supine. Pt able to participate in LUE therex in supine, pt dozing off, keeping eyes closed. Provided with new squeeze ball as pt reported he lost the one administered last session. Pt presenting with impairments listed below, will follow acutely. Patient will benefit from intensive inpatient follow up therapy, >3 hours/day to maximize safety/ind with ADLs/functional mobility.   PF 4.5 PS 5400 PI 2.7 PP 3.9      If plan is discharge home, recommend the following:  Two people to help with walking and/or transfers;Help with stairs or ramp for entrance;Assistance with feeding;Assist for transportation;Assistance with cooking/housework;A lot of help with bathing/dressing/bathroom   Equipment Recommendations  Other (comment) (defer)    Recommendations for Other Services PT consult    Precautions / Restrictions Precautions Precautions: Sternal;Fall Precaution Comments: LVAD Restrictions Weight Bearing Restrictions: Yes RUE Weight Bearing: Non weight bearing RLE Weight Bearing: Non weight bearing Other Position/Activity Restrictions: Sternal Precautions       Mobility Bed  Mobility Overal bed mobility: Needs Assistance Bed Mobility: Sit to Supine     Supine to sit: Max assist, +2 for physical assistance     General bed mobility comments: for assist with trunk and BLE    Transfers Overall transfer level: Needs assistance Equipment used: 2 person hand held assist Transfers: Sit to/from Stand, Bed to chair/wheelchair/BSC Sit to Stand: Mod assist, +2 physical assistance     Step pivot transfers: Mod assist, +2 physical assistance           Balance Overall balance assessment: Needs assistance Sitting-balance support: Feet supported, Single extremity supported Sitting balance-Leahy Scale: Fair     Standing balance support: Single extremity supported, Bilateral upper extremity supported, During functional activity Standing balance-Leahy Scale: Poor                             ADL either performed or assessed with clinical judgement   ADL Overall ADL's : Needs assistance/impaired                         Toilet Transfer: Moderate assistance;+2 for physical assistance;Stand-pivot;BSC/3in1 Toilet Transfer Details (indicate cue type and reason): 2 person HHA         Functional mobility during ADLs: Moderate assistance      Extremity/Trunk Assessment Upper Extremity Assessment LUE Deficits / Details: edematous, ,able to raise arm ~90* with active assist, can flex /ext elbow to bring hand to mouth with elbow supported, wrist flex/ext in neutral and in pronated position, can composite flex/ext digits LUE Coordination: decreased fine motor;decreased gross motor   Lower Extremity Assessment  Lower Extremity Assessment: Defer to PT evaluation        Vision   Vision Assessment?: No apparent visual deficits   Perception Perception Perception: Within Functional Limits   Praxis Praxis Praxis: WFL    Cognition Arousal: Alert Behavior During Therapy: Flat affect Overall Cognitive Status: Impaired/Different from  baseline Area of Impairment: Problem solving, Awareness, Following commands, Safety/judgement, Memory                     Memory: Decreased recall of precautions (mod cues to maintain during session, although pt can verbalize) Following Commands: Follows one step commands with increased time Safety/Judgement: Decreased awareness of safety, Decreased awareness of deficits Awareness: Emergent Problem Solving: Slow processing, Requires verbal cues, Difficulty sequencing General Comments: states he has been up in the chair "all morning" had only been up 1.5 hours.        Exercises Exercises: General Upper Extremity General Exercises - Upper Extremity Shoulder Flexion: AAROM, Left, 10 reps, Supine Elbow Flexion: 10 reps, Left, Supine, AAROM Elbow Extension: AAROM, Left, 10 reps, Supine Wrist Flexion: AROM, Left, 10 reps, Supine Wrist Extension: AROM, Left, 10 reps, Supine Digit Composite Flexion: AROM, Left, 10 reps, Supine Composite Extension: AROM, Left, 10 reps, Supine    Shoulder Instructions       General Comments VSS    Pertinent Vitals/ Pain       Pain Assessment Pain Assessment: Faces Pain Score: 5  Faces Pain Scale: Hurts even more Pain Location: bottom Pain Descriptors / Indicators: Discomfort Pain Intervention(s): Limited activity within patient's tolerance, Monitored during session, Repositioned  Home Living                                          Prior Functioning/Environment              Frequency  Min 1X/week        Progress Toward Goals  OT Goals(current goals can now be found in the care plan section)  Progress towards OT goals: Progressing toward goals  Acute Rehab OT Goals Patient Stated Goal: none stated OT Goal Formulation: With patient Time For Goal Achievement: 09/14/22 Potential to Achieve Goals: Fair ADL Goals Pt Will Perform Grooming: with contact guard assist;sitting Pt Will Perform Upper Body Bathing:  with min assist;sitting Pt Will Perform Upper Body Dressing: with mod assist;sitting Pt Will Transfer to Toilet: with mod assist;squat pivot transfer;bedside commode Additional ADL Goal #1: Tolerate sitting EOB up to 5 min with supervision to increase independince with toileting.  Plan      Co-evaluation                 AM-PAC OT "6 Clicks" Daily Activity     Outcome Measure   Help from another person eating meals?: A Little Help from another person taking care of personal grooming?: A Lot Help from another person toileting, which includes using toliet, bedpan, or urinal?: Total Help from another person bathing (including washing, rinsing, drying)?: A Lot Help from another person to put on and taking off regular upper body clothing?: A Lot Help from another person to put on and taking off regular lower body clothing?: Total 6 Click Score: 11    End of Session Equipment Utilized During Treatment: Gait belt  OT Visit Diagnosis: Unsteadiness on feet (R26.81);Other abnormalities of gait and mobility (R26.89);Muscle weakness (generalized) (M62.81)   Activity Tolerance  Patient tolerated treatment well   Patient Left in bed;with call bell/phone within reach;with bed alarm set   Nurse Communication Mobility status        Time: 1610-9604 OT Time Calculation (min): 27 min  Charges: OT General Charges $OT Visit: 1 Visit OT Treatments $Self Care/Home Management : 8-22 mins $Therapeutic Exercise: 8-22 mins  Carver Fila, OTD, OTR/L SecureChat Preferred Acute Rehab [phone]   Dalphine Handing 09/11/2022, 2:39 PM

## 2022-09-12 ENCOUNTER — Ambulatory Visit: Payer: Medicaid Other

## 2022-09-12 ENCOUNTER — Inpatient Hospital Stay (HOSPITAL_COMMUNITY): Payer: Medicare Other

## 2022-09-12 ENCOUNTER — Encounter (HOSPITAL_COMMUNITY)
Admission: EM | Disposition: A | Discharge status: Rehabilitation Facility | Payer: Self-pay | Source: Home / Self Care | Attending: Internal Medicine

## 2022-09-12 DIAGNOSIS — I428 Other cardiomyopathies: Secondary | ICD-10-CM | POA: Diagnosis not present

## 2022-09-12 DIAGNOSIS — R570 Cardiogenic shock: Secondary | ICD-10-CM | POA: Diagnosis not present

## 2022-09-12 DIAGNOSIS — I5023 Acute on chronic systolic (congestive) heart failure: Secondary | ICD-10-CM | POA: Diagnosis not present

## 2022-09-12 HISTORY — PX: RIGHT HEART CATH: CATH118263

## 2022-09-12 LAB — POCT I-STAT EG7
Acid-Base Excess: 7 mmol/L — ABNORMAL HIGH (ref 0.0–2.0)
Acid-Base Excess: 9 mmol/L — ABNORMAL HIGH (ref 0.0–2.0)
Bicarbonate: 31.2 mmol/L — ABNORMAL HIGH (ref 20.0–28.0)
Bicarbonate: 33.2 mmol/L — ABNORMAL HIGH (ref 20.0–28.0)
Calcium, Ion: 1.04 mmol/L — ABNORMAL LOW (ref 1.15–1.40)
Calcium, Ion: 1.14 mmol/L — ABNORMAL LOW (ref 1.15–1.40)
HCT: 29 % — ABNORMAL LOW (ref 39.0–52.0)
HCT: 30 % — ABNORMAL LOW (ref 39.0–52.0)
Hemoglobin: 10.2 g/dL — ABNORMAL LOW (ref 13.0–17.0)
Hemoglobin: 9.9 g/dL — ABNORMAL LOW (ref 13.0–17.0)
O2 Saturation: 55 %
O2 Saturation: 56 %
Potassium: 3.3 mmol/L — ABNORMAL LOW (ref 3.5–5.1)
Potassium: 3.5 mmol/L (ref 3.5–5.1)
Sodium: 138 mmol/L (ref 135–145)
Sodium: 140 mmol/L (ref 135–145)
TCO2: 33 mmol/L — ABNORMAL HIGH (ref 22–32)
TCO2: 35 mmol/L — ABNORMAL HIGH (ref 22–32)
pCO2, Ven: 42.7 mmHg — ABNORMAL LOW (ref 44–60)
pCO2, Ven: 45.1 mmHg (ref 44–60)
pH, Ven: 7.472 — ABNORMAL HIGH (ref 7.25–7.43)
pH, Ven: 7.475 — ABNORMAL HIGH (ref 7.25–7.43)
pO2, Ven: 27 mmHg — CL (ref 32–45)
pO2, Ven: 28 mmHg — CL (ref 32–45)

## 2022-09-12 LAB — GLUCOSE, CAPILLARY
Glucose-Capillary: 120 mg/dL — ABNORMAL HIGH (ref 70–99)
Glucose-Capillary: 105 mg/dL — ABNORMAL HIGH (ref 70–99)
Glucose-Capillary: 92 mg/dL (ref 70–99)
Glucose-Capillary: 107 mg/dL — ABNORMAL HIGH (ref 70–99)
Glucose-Capillary: 155 mg/dL — ABNORMAL HIGH (ref 70–99)

## 2022-09-12 LAB — HEPARIN LEVEL (UNFRACTIONATED): Heparin Unfractionated: <0.1 [IU]/mL — ABNORMAL LOW (ref 0.30–0.70)

## 2022-09-12 LAB — COOXEMETRY PANEL
Carboxyhemoglobin: 2.2 % — ABNORMAL HIGH (ref 0.5–1.5)
Methemoglobin: <0.7 % (ref 0.0–1.5)
O2 Saturation: 60.9 %
Total hemoglobin: 8.9 g/dL — ABNORMAL LOW (ref 12.0–16.0)

## 2022-09-12 LAB — COMPREHENSIVE METABOLIC PANEL
ALT: 138 U/L — ABNORMAL HIGH (ref 0–44)
AST: 117 U/L — ABNORMAL HIGH (ref 15–41)
Albumin: 2.5 g/dL — ABNORMAL LOW (ref 3.5–5.0)
Alkaline Phosphatase: 131 U/L — ABNORMAL HIGH (ref 38–126)
Anion gap: 6 (ref 5–15)
BUN: 36 mg/dL — ABNORMAL HIGH (ref 6–20)
CO2: 33 mmol/L — ABNORMAL HIGH (ref 22–32)
Calcium: 8.2 mg/dL — ABNORMAL LOW (ref 8.9–10.3)
Chloride: 94 mmol/L — ABNORMAL LOW (ref 98–111)
Creatinine, Ser: 0.79 mg/dL (ref 0.61–1.24)
GFR, Estimated: >60 mL/min (ref >60)
Glucose, Bld: 137 mg/dL — ABNORMAL HIGH (ref 70–99)
Potassium: 4.1 mmol/L (ref 3.5–5.1)
Sodium: 133 mmol/L — ABNORMAL LOW (ref 135–145)
Total Bilirubin: 1 mg/dL (ref 0.3–1.2)
Total Protein: 6.2 g/dL — ABNORMAL LOW (ref 6.5–8.1)

## 2022-09-12 LAB — MAGNESIUM: Magnesium: 2.1 mg/dL (ref 1.7–2.4)

## 2022-09-12 LAB — CBC
HCT: 27.9 % — ABNORMAL LOW (ref 39.0–52.0)
Hemoglobin: 8.5 g/dL — ABNORMAL LOW (ref 13.0–17.0)
MCH: 29.9 pg (ref 26.0–34.0)
MCHC: 30.5 g/dL (ref 30.0–36.0)
MCV: 98.2 fL (ref 80.0–100.0)
Platelets: 383 10*3/uL (ref 150–400)
RBC: 2.84 MIL/uL — ABNORMAL LOW (ref 4.22–5.81)
RDW: 17.2 % — ABNORMAL HIGH (ref 11.5–15.5)
WBC: 16.5 10*3/uL — ABNORMAL HIGH (ref 4.0–10.5)
nRBC: 0 % (ref 0.0–0.2)

## 2022-09-12 LAB — PROTIME-INR
INR: 1.4 — ABNORMAL HIGH (ref 0.8–1.2)
Prothrombin Time: 16.9 seconds — ABNORMAL HIGH (ref 11.4–15.2)

## 2022-09-12 LAB — LACTATE DEHYDROGENASE: LDH: 309 U/L — ABNORMAL HIGH (ref 98–192)

## 2022-09-12 SURGERY — RIGHT HEART CATH
Anesthesia: LOCAL

## 2022-09-12 MED ORDER — FUROSEMIDE 10 MG/ML IJ SOLN: 40.0000 mg | Freq: Once | INTRAMUSCULAR | Status: DC

## 2022-09-12 MED ORDER — LIDOCAINE HCL (PF) 1 % IJ SOLN
INTRAMUSCULAR | Status: DC | PRN
  Administered 2022-09-12: 2 mL

## 2022-09-12 MED ORDER — SILDENAFIL CITRATE 20 MG PO TABS
20.0000 mg | ORAL_TABLET | Freq: Three times a day (TID) | ORAL | Status: DC
  Administered 2022-09-12 – 2022-09-27 (×46): 20 mg via ORAL
  Filled 2022-09-12 (×52): qty 1

## 2022-09-12 MED ORDER — HEPARIN (PORCINE) IN NACL 1000-0.9 UT/500ML-% IV SOLN
INTRAVENOUS | Status: DC | PRN
  Administered 2022-09-12: 500 mL

## 2022-09-12 MED ORDER — ALBUMIN HUMAN 25 % IV SOLN
12.5000 g | Freq: Four times a day (QID) | INTRAVENOUS | Status: AC
  Administered 2022-09-12 (×2): 12.5 g via INTRAVENOUS
  Filled 2022-09-12 (×2): qty 50

## 2022-09-12 MED ORDER — TORSEMIDE 20 MG PO TABS: 40.0000 mg | ORAL_TABLET | Freq: Every day | ORAL | Status: DC

## 2022-09-12 MED ORDER — MILRINONE LACTATE IN DEXTROSE 20-5 MG/100ML-% IV SOLN
0.1250 ug/kg/min | INTRAVENOUS | Status: DC
  Administered 2022-09-12: 0.25 ug/kg/min via INTRAVENOUS
  Administered 2022-09-13: 0.125 ug/kg/min via INTRAVENOUS
  Filled 2022-09-12 (×2): qty 100

## 2022-09-12 MED ORDER — LIDOCAINE HCL (PF) 1 % IJ SOLN
INTRAMUSCULAR | Status: AC
  Filled 2022-09-12: qty 30

## 2022-09-12 MED ORDER — WARFARIN SODIUM 5 MG PO TABS
5.0000 mg | ORAL_TABLET | Freq: Once | ORAL | Status: AC
  Administered 2022-09-12: 5 mg via ORAL
  Filled 2022-09-12: qty 1

## 2022-09-12 MED ORDER — FUROSEMIDE 10 MG/ML IJ SOLN
80.0000 mg | Freq: Once | INTRAMUSCULAR | Status: AC
  Administered 2022-09-12: 80 mg via INTRAVENOUS
  Filled 2022-09-12: qty 8

## 2022-09-12 SURGICAL SUPPLY — 5 items
CATH BALLN WEDGE 5F 110CM (CATHETERS) IMPLANT
PACK CARDIAC CATHETERIZATION (CUSTOM PROCEDURE TRAY) ×1 IMPLANT
SHEATH GLIDE SLENDER 4/5FR (SHEATH) IMPLANT
TRANSDUCER W/STOPCOCK (MISCELLANEOUS) IMPLANT
TUBING ART PRESS 72 MALE/FEM (TUBING) IMPLANT

## 2022-09-12 NOTE — Progress Notes (Signed)
CSW met at bedside with patient and daughter. Patient states that he is making slight progress and had a good lunch today. Patient appeared hopeful and even shared some humor during the visit. CSW continues to follow for supportive intervention throughout implant hospitalization. Lasandra Beech, LCSW, CCSW-MCS [phone]

## 2022-09-12 NOTE — Progress Notes (Addendum)
Speech Language Pathology Treatment: Dysphagia  Patient Details Name: Charles Holmes MRN: [id number] DOB: 1965-08-02 Today's Date: 09/12/2022 Time: 1320-1340 SLP Time Calculation (min) (ACUTE ONLY): 20 min  Assessment / Plan / Recommendation Clinical Impression  Pt demonstrates tolerance of chopped up spaghetti. Pt hoping for a greater variety of foods,t hough he continues to need fairly soft textures. It is possible he may be offered some foods on his tray that are not soft enough, but pt will be able to avoid these as needed. Pt show no further evidence of aspiration or dysphagia with thin liquids. Is able to self feed at a reasonable rate. If pt wanted to upgrade further and make his own choices about soft foods, that is also ok and RN and MD can modify diet as needed. For now, mechanical soft is most appropriate. No further SLP interventions needed acutely.   HPI HPI: Tavion Seeliger is 57 year old presented to [facility] on 08/10/22 for acute on chronic systolic heart failure. Multiple dental extractions 8/6. Pt had IABP placed on 08/25/22 for ongoing cardiogenic shock. Underwent placement of LVAD on 8/13. Developed lt sided weakness on 8/14. Rt MCA CVA. Underwent mechanical thrombectomy by IR. ETT 8/13-15.  PMH - CAD, CHF, HLD, HTN, systolic HF, ICD implant, rt CVA.  LVAD placement and new stroke R MCA M1.      SLP Plan  Continue with current plan of care      Recommendations for follow up therapy are one component of a multi-disciplinary discharge planning process, led by the attending physician.  Recommendations may be updated based on patient status, additional functional criteria and insurance authorization.    Recommendations  Diet recommendations: Dysphagia 3 (mechanical soft) Liquids provided via: Straw;Cup Medication Administration: Whole meds with liquid Supervision: Staff to assist with self feeding Postural Changes and/or Swallow Maneuvers: Seated upright 90 degrees                               Continue with current plan of care     Damika Harmon, Riley Nearing  09/12/2022, 2:00 PM

## 2022-09-12 NOTE — Progress Notes (Signed)
VAD coordinator accompanied pt to cath lab. No sedation was given. Milrinone was decreased to 0.25 mcg/kg/min. Pt tolerated the procedure well.  Carlton Adam RN, BSN VAD Coordinator 24/7 Pager [phone]

## 2022-09-12 NOTE — Progress Notes (Signed)
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 Off anticoagulation now  due to mild hemorrhagic extension by f/u CT.  Patient was started on IV antibiotics postop day 6 for elevated white count and fever.  Cultures are negative.  Chest x-ray shows only mild atelectasis no significant effusion.  Sternal incision looks clearing and dry.  Temperature curve and white count continue to improve.  Blood cultures sputum culture and urine cultures remain negative.  Chest x-ray improving with significant diuresis.He is close to preop weight.  Patient's left-sided weakness is being treated with physical therapy and his mobility is improving, able to stand and pivot to the chair. OOB to chair daily  PharmD is slowly loading coumadin with low dose heparin bridge.  INR currently 1.3, coumadin dose has been increased.  Wt down to 172 with preop wt 169. Epinephrine weaned down to 3 mcg. Right heart cath planned today to assess RV function, PAPI.  LVAD INTERROGATION:  HeartMate II LVAD:  Flow 4.3  liters/min, speed 5300 rpm, power 3.6, PI 4.1.  Controller intact.   Objective:    Vital Signs:   Temp:  [97.9 F (36.6 C)-99.7 F (37.6 C)] 98.7 F (37.1 C) (08/27 0733) Pulse Rate:  [105-221] 221 (08/27 0800) Resp:  [16-29] 19  (08/27 1000) BP: (73-104)/(43-78) 92/77 (08/27 1000) SpO2:  [77 %-97 %] 90 % (08/27 1000) Weight:  [77.4 kg] 77.4 kg (08/27 0500) Last BM Date : 09/12/22 Mean arterial Pressure 88-92 mm Hg  Intake/Output:  Exam  General -alert moving L side better, speech normal, drinking Ensure and tolerating mechanical diet HEENT:-No bleeding from mouth at extraction sits Neck: supple. JVP . Carotids without bruits. No lymphadenopathy or thryomegaly appreciated. Cor: Mechanical heart sounds with LVAD hum present. Lungs: clear Abdomen: soft, nontender, nondistended. No hepatosplenomegaly. No bruits or masses. Good bowel sounds. Extremities: no cyanosis, clubbing, rash, edema Neuro: alert & orientedx3, cranial nerves grossly intact. moves all 4 extremities .Improving L arm,leg motor function.  Telemetry:paced with PPM 90/min  Labs: Basic Metabolic Panel: Recent Labs  Lab 09/09/22 0516 09/10/22 0346 09/11/22 0500 09/11/22 2122 09/12/22 0353  NA 133* 131* 133* 134* 133*  K 4.2 4.3 3.6 3.3* 4.1  CL 95* 96* 96* 92* 94*  CO2 28 26 29 30  33*  GLUCOSE 174* 196* 157* 147* 137*  BUN 32* 32* 33* 33* 36*  CREATININE 0.77 0.74 0.75 0.68 0.79  CALCIUM 8.1* 7.9* 8.1* 8.1* 8.2*  MG 2.0 2.0 2.2 1.9 2.1    Liver Function Tests: Recent Labs  Lab 09/08/22 0408 09/09/22 0516 09/10/22 0346 09/11/22 0500 09/12/22 0353  AST 123* 113* 146* 143* 117*  ALT 103* 103* 138* 150* 138*  ALKPHOS 174* 168* 159* 140* 131*  BILITOT 1.5* 1.0 0.9 1.1 1.0  PROT 5.4* 5.9* 5.9* 6.1* 6.2*  ALBUMIN 1.8* 2.2* 2.0* 2.3* 2.5*   No results for input(s): "LIPASE", "AMYLASE" in the last 168  hours. No results for input(s): "AMMONIA" in the last 168 hours.  CBC: Recent Labs  Lab 09/08/22 0408 09/09/22 0516 09/10/22 0346 09/11/22 0500 09/12/22 0353  WBC 24.6* 22.2* 22.5* 18.1* 16.5*  HGB 9.2* 8.5* 8.7* 8.6* 8.5*  HCT 29.4* 27.4* 28.1* 28.1* 27.9*  MCV 100.3* 101.5* 100.4* 97.6 98.2  PLT 444* 425* 471* 488* 383     INR: Recent Labs  Lab 09/08/22 0408 09/09/22 0516 09/10/22 0346 09/11/22 0500 09/12/22 0353  INR 1.0 1.2 1.4* 1.3* 1.4*    Other results: EKG:   Imaging: ECHOCARDIOGRAM LIMITED  Result Date: 09/11/2022    ECHOCARDIOGRAM LIMITED REPORT   Patient Name:   Charles Holmes Date of Exam: 09/11/2022 Medical Rec #:  [id number]       Height:       67.0 in Accession #:    [id number]      Weight:       172.4 lb Date of Birth:  1965/11/02       BSA:          1.899 m Patient Age:    57 years        BP:           0/0 mmHg Patient Gender: M               HR:           115 bpm. Exam Location:  Inpatient Procedure: Limited Echo Indications:    LAVD  History:        Patient has prior history of Echocardiogram examinations, most                 recent 09/04/2022. CAD.  Sonographer:    Darlys Gales Referring Phys: [phone] DALTON S MCLEAN IMPRESSIONS  1. LVAD at the apex. Left ventricular ejection fraction, by estimation, is 20 to 25%. The left ventricle has severely decreased function. The left ventricle demonstrates global hypokinesis.  2. Right ventricular systolic function is severely reduced. The right ventricular size is normal. A device lead is visualized.  3. Left atrial size was mildly dilated.  4. Right atrial size was mildly dilated.  5. The aortic valve was not well visualized. FINDINGS  Left Ventricle: LVAD at the apex. Left ventricular ejection fraction, by estimation, is 20 to 25%. The left ventricle has severely decreased function. The left ventricle demonstrates global hypokinesis. Right Ventricle: The right ventricular size is normal. Right ventricular systolic function is severely reduced. Left Atrium: Left atrial size was mildly dilated. Right Atrium: Right atrial size was mildly dilated. Aortic Valve: The aortic valve was not well visualized. Additional Comments: A device lead is visualized.  Armanda Magic MD Electronically signed by Armanda Magic MD Signature Date/Time: 09/11/2022/10:41:29 AM    Final     DG Chest Port 1 View  Result Date: 09/11/2022 CLINICAL DATA:  Congestive heart failure. Weakness. Left ventricular assist device in place. EXAM: PORTABLE CHEST 1 VIEW COMPARISON:  Radiographs 09/09/2022, 09/08/2022 and others. FINDINGS: 0525 hours. Mild patient rotation to the right. Stable cardiomegaly post median sternotomy and presumed CABG. Left subclavian AICD and left ventricular assist device are unchanged in position. Right arm PICC projects to the superior cavoatrial junction. Feeding tube projects below the diaphragm, tip not visualized. Stable vascular congestion, small pleural effusions and bibasilar atelectasis. Old right-sided rib fractures appear unchanged. IMPRESSION: No significant change in appearance of the chest. Cardiomegaly, vascular congestion and small pleural effusions. Support system appears unchanged. Electronically Signed   By: Chrissie Noa  Purcell Mouton M.D.   On: 09/11/2022 09:43     Medications:     Scheduled Medications:  sodium chloride   Intravenous Once   aspirin  324 mg Oral Daily   atorvastatin  80 mg Oral Daily   Chlorhexidine Gluconate Cloth  6 each Topical Q0600   digoxin  0.125 mg Oral Daily   dorzolamide-timolol  1 drop Both Eyes BID   feeding supplement  237 mL Oral TID BM   feeding supplement (PROSource TF20)  60 mL Per Tube BID   feeding supplement (VITAL 1.5 CAL)  780 mL Per Tube Q24H   fiber supplement (BANATROL TF)  60 mL Per Tube TID   insulin aspart  0-24 Units Subcutaneous TID WC & HS   latanoprost  1 drop Both Eyes QHS   melatonin  3 mg Oral QHS   mexiletine  250 mg Oral BID   mometasone-formoterol  2 puff Inhalation BID   multivitamin with minerals  1 tablet Oral Daily   mouth rinse  15 mL Mouth Rinse 4 times per day   pantoprazole (PROTONIX) IV  40 mg Intravenous QHS   QUEtiapine  50 mg Oral QHS   sildenafil  20 mg Oral TID   sodium chloride flush  3 mL Intravenous Q12H   [START ON 09/13/2022] torsemide  40 mg Oral Daily   umeclidinium  bromide  1 puff Inhalation Daily   warfarin  5 mg Oral ONCE-1600   Warfarin - Pharmacist Dosing Inpatient   Does not apply q1600    Infusions:  sodium chloride Stopped (09/08/22 1239)   sodium chloride 10 mL/hr at 09/12/22 1000   epinephrine 3 mcg/min (09/12/22 1008)   heparin 500 Units/hr (09/12/22 1000)   milrinone 0.375 mcg/kg/min (09/12/22 1000)    PRN Medications: sodium chloride, acetaminophen, albuterol, dextrose, ondansetron (ZOFRAN) IV, mouth rinse, oxyCODONE, polyethylene glycol, sodium chloride, sodium chloride flush, traMADol   Assessment:  57 year old patient with sarcoidosis, COPD history of stroke  and nonischemic cardiomyopathy with preoperative cardiogenic shock requiring balloon pump and inotropic therapy.  Postoperative  right middle cerebral artery CVA treated with neuro-- radiology invention,   Postop RV dysfunction req inhaled NO and inotropes with incresad bilirubin 1.2>> 4.9 Now off inhaled NO and bilirubin improving Patient has been diuresed down to preop weight. Still requiring low-dose epinephrine and milrinone. Repeat echo and right heart cath to assess RV function. Plan/Discussion:   RV dysfunction status post VAD implant-May need long-term postoperative inotropic support.  Right heart cath planned today to help guide therapy.  Left side weakness from recurrent CVA. Working with physical therapy, ultimate goal will be CIR. Tube feeds at night, encourage PO intake- swallow function ok after CVA    Remove EPWs when off iv heparin, INR reasonable  I reviewed the LVAD parameters from today, and compared the results to the patient's prior recorded data.  No programming changes were made.  The LVAD is functioning within specified parameters.  The patient performs LVAD self-test daily.  LVAD interrogation was negative for any significant power changes, alarms or PI events/speed drops.  LVAD equipment check completed and is in good working order.  Back-up  equipment present.   LVAD education done on emergency procedures and precautions and reviewed exit site care.  Length of Stay: [address]  Lovett Sox 09/12/2022, 10:25 AM

## 2022-09-12 NOTE — Progress Notes (Signed)
LVAD Coordinator Rounding Note:  Admitted 08/10/22 due to acute on chronic CHF with cardiogenic shock. Milrinone dependent. Advance therapy workup completed, and pt deemed acceptable VAD candidate. Dental extractions completed 8/6. IABP placed 08/25/22.  HM 3 LVAD implanted on 08/29/22 by Dr Donata Clay under destination therapy criteria. 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 Result negative.  7/25 Admit with cardiogenic shock. Started milrinone and NE. 8/6 S/P 13 teeth extractions  8/9 IABP placed 8/13 S/p HM III LVAD implant + clipping left atrial appendage d/t severe thickening and invagination of mitral valve annulus impeding flows  CT Head 8/14 (initial) 1. 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 ASPECTS is 7. 2. No acute hemorrhage.  CT Angio Head/Neck 1. 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 2. Core infarct of 12 cc (somewhat underestimated compared to aspects) with 90 cc of penumbra. 3. Mild atherosclerosis.  Pt taken emergently to IR for percutaneous right common carotid arteriogram with thrombectomy. Revascularization achieved. Angio-seal closure device applied to left groin- clean, dry, and intact.   CT Head 8/15 @ 0450 Unchanged extent of infarct and hemorrhage in the right MCA distribution including small volume intraventricular clot. No hydrocephalus.  CT Head 8/17 @ 0701 Interval evolution of the right MCA territory infarct with decreased intraparenchymal and intraventricular hemorrhage. No hydrocephalus or midline shift.   Pt lying in bed on my arrival. Pt on Sildenafil, Epi down to 4 mcg/min Pt progressing with PT/OT/SLP. CIR consulted once medically stable.   Pt is posted for RHC today at 1000 am. VAD coordinator will accompany him to cath lab.  Vital signs: Temp: 98.7 HR: 108 Doppler Pressure: 76 Automatic BP: 85/70 (77) O2 Sat: 92% on RA Wt:  183.6>191.1>190.9>190.2>184.8>178.8>178.1>172.4>170.6lbs   LVAD interrogation reveals:  Speed: 5400 Flow: 4.8 Power: 4 w PI: 2.2  Alarms: none Events: none  Hematocrit: 28 Fixed speed: 5400 Low speed limit: 5100  Drive Line:  CDI. Continue Monday/ Wednesday/Friday dressing changes.  Next dressing change due 09/13/22 by VAD coordinator or nurse champion only.    Labs:  LDH trend: 596>441>165>384>345>312>312>350>327>309  INR trend: 1.4>1.6>1.1>1.1>1.3>1.2>1>1.3>1.4  AST/ALT trend: 218/45>96/30>73/23>63/36>58/37>69/50>93/72>123/103>143/150>117/138  Total Bili trend: 4.4>4.6>5.1>1.8>1.9>1.2>1.0>1.5>1.1  WBC trend: 11.6>13.7>19.6>16.1>23>26.6>21>24.6>18.1>16.5  Anticoagulation Plan: -INR Goal: 2.0 - 2.5 -ASA Dose: 325 mg  -Heparin 500 u/hr - Coumadin dosing per pharmacy  Blood Products:  IntraOp 8/14: - 4 FFP - 2 Platelets - 2 PRBC - 1 cyro - 449 cc of cellsaver - DDAVP 20 mcg x 1   Device: [employer] BiV -  -Therapies: OFF  Arrythmias:   Respiratory: RA  Infection:  09/01/22>> sputum cx>> NGTD>>final 09/01/22>> blood cx>> NGTD>>final 09/01/22>> urine cx>> NGTD>> final  Renal:  8/22: BUN/CRT: 28/0.75  Adverse Events on VAD: - 08/30/22: - Developed left sided weakness this am. CTA with R MCA infarct. Taken to IR for thrombectomy   Drips:  Epinephrine 4 mcg/min Milrinone 0.375 mcg/kg/min  Patient Education: Discussed and demonstrated power source change with pt. Left extremity too weak at this time to perform power change on his own.  No family at bedside.   Plan/Recommendations:  Please page VAD coordinator for any alarms or VAD equipment issues. Continue MWF dressing changes by VAD coordinator or Nurse Alla Feeling. VAD coordinator will accompany pt to cath lab   Carlton Adam RN,BSN VAD Coordinator  Office: [phone]  24/7 Pager: [phone]

## 2022-09-12 NOTE — Progress Notes (Addendum)
Patient ID: Charles Holmes, male   DOB: 09-07-1965, 57 y.o.   MRN: [id number]     Advanced Heart Failure Rounding Note  PCP-Cardiologist: Norman Herrlich, MD  [facility]: Dr. Gala Romney   Subjective:    7/25: Admit with cardiogenic shock. Started milrinone and NE. 8/6 S/P 13 teeth extractions  8/9 IABP placed 8/13 S/p HM III LVAD implant + clipping left atrial appendage d/t severe thickening and invagination of mitral valve annulus impeding flows 8/14 Left-sided hemiplegia. CT head with acute R MCA infarct. IR for thrombectomy. CT head with small to moderate area of hemorrhagic conversion 8/15 extubated  8/17 Low dose heparin gtt restarted, CT head with decreased size of hemorrhagic stroke.  8/18 Neurology signed off 8/19: Limited echo: Mod RV dysfunction, better mitral inflow into the LV and VAD position satisfactory.  Post Op Day #13  Echo at bedside 09/11/22.  Moderate-severe RV dysfunction but RV not enlarged.  Interventricular septum does bow towards towards the right. Speed increased to 5400 rpm, flow rose to 4.9 L/min.  Interventricular septum remained stable bowing slightly towards the right.     Epinephrine remains at 4.  MAP generally in 70s now.  Milrinone 0.375.  Co-ox 61% this morning.  Diuresed with Lasix 80 mg IV bid. Net -2.6L UOP. Weight down 2lbs. CVP 6  LVAD Interrogation HM III: Speed: 5400 Flow: 4.9  PI: 2.1 Power: 3.9.    Objective:   Weight Range: 77.4 kg Body mass index is 26.73 kg/m.   Vital Signs:   Temp:  [97.9 F (36.6 C)-99.7 F (37.6 C)] 99.3 F (37.4 C) (08/27 0400) Pulse Rate:  [105-216] 216 (08/27 0430) Resp:  [16-29] 21 (08/27 0645) BP: (73-104)/(43-81) 86/61 (08/27 0645) SpO2:  [77 %-97 %] 92 % (08/27 0345) Weight:  [77.4 kg] 77.4 kg (08/27 0500) Last BM Date : 09/11/22  Weight change: Filed Weights   09/10/22 0700 09/11/22 0645 09/12/22 0500  Weight: 79.4 kg 78.2 kg 77.4 kg    Intake/Output:   Intake/Output Summary (Last 24 hours) at  09/12/2022 0704 Last data filed at 09/12/2022 0500 Gross per 24 hour  Intake 2077.56 ml  Output 4750 ml  Net -2672.44 ml   Physical Exam  CVP 6 General:  Well appearing. No resp difficulty HEENT: Normal. +cortrak Neck: supple. JVP ~6. Carotids 2+ bilat; no bruits. No lymphadenopathy or thyromegaly appreciated. Cor: Mechanical heart sounds with LVAD hum present. Lungs: Clear Abdomen: soft, nontender, nondistended. No hepatosplenomegaly. No bruits or masses. Good bowel sounds. Driveline: C/D/I; securement device intact and driveline incorporated Extremities: no cyanosis, clubbing, rash, edema. PICC RUE. +UNNA boots Neuro: alert & orientedx3, cranial nerves grossly intact. moves all 4 extremities w/o difficulty. Affect pleasant   Telemetry     ST with BiV pacing 100-110s (personally reviewed)   Labs    CBC Recent Labs    09/11/22 0500 09/12/22 0353  WBC 18.1* 16.5*  HGB 8.6* 8.5*  HCT 28.1* 27.9*  MCV 97.6 98.2  PLT 488* 383    Basic Metabolic Panel Recent Labs    27/25/36 2122 09/12/22 0353  NA 134* 133*  K 3.3* 4.1  CL 92* 94*  CO2 30 33*  GLUCOSE 147* 137*  BUN 33* 36*  CREATININE 0.68 0.79  CALCIUM 8.1* 8.2*  MG 1.9 2.1    BNP: BNP (last 3 results) Recent Labs    08/30/22 0258 09/04/22 2144 09/11/22 2252  BNP 922.3* 1,050.6* 933.0*     Imaging    ECHOCARDIOGRAM LIMITED  Result  Date: 09/11/2022    ECHOCARDIOGRAM LIMITED REPORT   Patient Name:   Charles Holmes Date of Exam: 09/11/2022 Medical Rec #:  [id number]       Height:       67.0 in Accession #:    [id number]      Weight:       172.4 lb Date of Birth:  Oct 30, 1965       BSA:          1.899 m Patient Age:    57 years        BP:           0/0 mmHg Patient Gender: M               HR:           115 bpm. Exam Location:  Inpatient Procedure: Limited Echo Indications:    LAVD  History:        Patient has prior history of Echocardiogram examinations, most                 recent 09/04/2022. CAD.   Sonographer:    Darlys Gales Referring Phys: [phone] Raven Furnas S Breanne Olvera IMPRESSIONS  1. LVAD at the apex. Left ventricular ejection fraction, by estimation, is 20 to 25%. The left ventricle has severely decreased function. The left ventricle demonstrates global hypokinesis.  2. Right ventricular systolic function is severely reduced. The right ventricular size is normal. A device lead is visualized.  3. Left atrial size was mildly dilated.  4. Right atrial size was mildly dilated.  5. The aortic valve was not well visualized. FINDINGS  Left Ventricle: LVAD at the apex. Left ventricular ejection fraction, by estimation, is 20 to 25%. The left ventricle has severely decreased function. The left ventricle demonstrates global hypokinesis. Right Ventricle: The right ventricular size is normal. Right ventricular systolic function is severely reduced. Left Atrium: Left atrial size was mildly dilated. Right Atrium: Right atrial size was mildly dilated. Aortic Valve: The aortic valve was not well visualized. Additional Comments: A device lead is visualized.  Armanda Magic MD Electronically signed by Armanda Magic MD Signature Date/Time: 09/11/2022/10:41:29 AM    Final      Medications:     Scheduled Medications:  sodium chloride   Intravenous Once   aspirin  324 mg Oral Daily   atorvastatin  80 mg Oral Daily   Chlorhexidine Gluconate Cloth  6 each Topical Q0600   digoxin  0.125 mg Oral Daily   dorzolamide-timolol  1 drop Both Eyes BID   feeding supplement  237 mL Oral TID BM   feeding supplement (PROSource TF20)  60 mL Per Tube BID   feeding supplement (VITAL 1.5 CAL)  780 mL Per Tube Q24H   fiber supplement (BANATROL TF)  60 mL Per Tube TID   furosemide  80 mg Intravenous BID   insulin aspart  0-24 Units Subcutaneous TID WC & HS   latanoprost  1 drop Both Eyes QHS   melatonin  3 mg Oral QHS   mexiletine  250 mg Oral BID   mometasone-formoterol  2 puff Inhalation BID   multivitamin with minerals  1 tablet Oral  Daily   mouth rinse  15 mL Mouth Rinse 4 times per day   pantoprazole (PROTONIX) IV  40 mg Intravenous QHS   QUEtiapine  50 mg Oral QHS   sildenafil  20 mg Per Tube TID   sodium chloride flush  3 mL Intravenous Q12H   umeclidinium  bromide  1 puff Inhalation Daily   Warfarin - Pharmacist Dosing Inpatient   Does not apply q1600    Infusions:  sodium chloride Stopped (09/08/22 1239)   sodium chloride     epinephrine 4 mcg/min (09/12/22 0500)   heparin 500 Units/hr (09/12/22 0500)   milrinone 0.375 mcg/kg/min (09/12/22 0500)    PRN Medications: sodium chloride, acetaminophen, albuterol, dextrose, ondansetron (ZOFRAN) IV, mouth rinse, oxyCODONE, polyethylene glycol, sodium chloride, sodium chloride flush, traMADol   Patient Profile  Mr Comans is a 57 y.o. 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 Admitted with cardiogenic shock, stabilized and underwent HM3 LVAD. Post implant course c/b acute CVA.   Assessment/Plan  1.  Acute on chronic Systolic HF-->Cardiogenic Shock  - Diagnosed 11/2019. Presented with VT. LHC 70% LAD  - cMRI 12/21 concerning for sarcoid and EF 18%.  - PET 2/22 at [facility] EF 25% + active sarcoid - Echo 08/26/20 EF < 20% severely dilated LV RV mildly decreased.  - Medtronic CRT-D upgrade in 06/08/21 - Echo 07/10/22: EF <20%, RV okay, mod pericardial effusion, mod MR/TR - Admitted 07/25 with cardiogenic shock. - RHC: Nonobstructive CAD, severely elevated filling pressures and low Fick CO/CI (2.7/1.4) - 08/13 HM III LVAD implant + clipping LAA d/t severe thickening and invagination of mitral valve annulus impeding flows.  - Apical core sent to pathology to confirm diagnosis of cardiac sarcoid >> pending - Speed increased to 5300 on 08/14.  - Echo 8/26 mod-sev RV dysfunction. Speed increased to 5400 8/26 - This morning on epinephrine 4 mcg & milrinone 0.340mcg/kg/min. RV failure is slowing inotrope wean.   Digoxin added yesterday, continue sildenafil 20 tid. - CVP 6. Got 80 IV BID lasix yesterday. Net -2.6L UOP. Volume much improved. Will transition to PO today. Start Torsemide 40 mg daily - ASA 325 + heparin gtt + warfarin, INR still low 1.4   2.  Acute stroke - Hx CVA 06/24 -Admitted 06/24 w/ R MCA stroke. S/p TPA and mechanical clot extraction. No residual deficits. Likely cardioembolic in setting of severe LV dysfunction. - Developed left sided weakness 08/14. CTA with R MCA infarct. Taken to IR for thrombectomy - Repeat CT head with small to moderate size hemorrhagic conversion. CT head this am stable. - D/w neuro, repeat head CT on 8/17 w/ improved hemorrhagic CVA, now back on low dose heparin per neurology.  - Back on warfarin - neurology signed off 09/03/22.  - C/w PT/OT.  - Continue ASA 325  - Has Cor-Trak and still getting nocturnal tube feeds, intake improving. On dysphagia 1 diet. Dietitian following   - will need CIR once medically stable for d/c (team following)   3. 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 Continue mexiletine  - now s/p ICD.   4. CAD - LHC 12/07/19 70-% LAD, no intervention - LHC 8/24 non obstructive CAD.  - Continue statin. On aspirin for VAD.  5. Cardiac sarcoid - PET 2/22 at [facility] EF 25% + active sarcoid - Has completed prednisone.  - holding methotrexate w/ recent surgery  - Sent apical core to pathology to confirm diagnosis of cardiac sarcoid => not diagnostic of cardiac sarcoidosis.   6. 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 AKI - suspect cardiorenal, improved w/ inotropic support - Creatinine stable.  - follow BMP w/ diuresis    8. Iron deficiency anemia/  Post-op anemia - recent T sat 15%, scheduled for OP feraheme. Will complete inpatient  - Transfused 1 u RBCs 8/15 - Hgb 8.5 today.    9. Pulmonary  - PFTs with severe obstructive defect, response to bronchodilator. FEV1 1.04L, FEV1/FVC 48% -  extubated 8/15  - Cultures NGTD.   10. Elevated LFTs - AST/ALT stably elevated, normal bilirubin.   11. ID - WBC ct 22.5 => 18>16.5.  - completed Merrem/vanc  - Cultures with NGTD - Incentive spirometer and flutter valve at bedside, encouraged to use  Continue to mobilize with PT.   Length of Stay: 21  Alen Bleacher, NP  09/12/2022, 7:04 AM  VAD Team Pager [phone] (7am - 7am)   Advanced Heart Failure Team Pager [phone] (M-F; 7a - 5p)  Please contact [facility] for night-coverage after hours (5p -7a ) and weekends on [email or web address]  Patient seen with NP, agree with the above note.   Good diuresis yesterday, weight down 2 lbs.  CVP 10-11 on my read today.    Co-ox 61%, he remains on milrinone 0.375 mcg/kg/min, epinephrine 4.  MAP stable.   He did some walking yesterday per nurse.   LVAD parameters stable. Speed was increased to 5400 rpm yesterday.   General: Well appearing this am. NAD.  HEENT: Normal. Neck: Supple, JVP 10 cm. Carotids OK.  Cardiac:  Mechanical heart sounds with LVAD hum present.  Lungs:  CTAB, normal effort.  Abdomen:  NT, ND, no HSM. No bruits or masses. +BS  LVAD exit site: Well-healed and incorporated. Dressing dry and intact. No erythema or drainage. Stabilization device present and accurately applied. Driveline dressing changed daily per sterile technique. Extremities:  Warm and dry. No cyanosis, clubbing, rash, or edema.  Neuro:  Alert & oriented x 3. Cranial nerves grossly intact. Moves all 4 extremities w/o difficulty. Affect pleasant    Patient tolerated increase in LVAD speed yesterday.  Stable co-ox this morning.  Will aim to wean down epinephrine slowly today, continuing milrinone. RV dysfunction has been slowing inotrope wean, will do formal RHC today.  Discussed risks/benefits with patient and he agrees to procedure.   CVP 10-11 today, will give Lasix 80 mg IV x 1, probably to torsemide tomorrow.   INR 1.4, continue heparin/warfarin  overlap.   Continue to mobilize.   CRITICAL CARE Performed by: Marca Ancona  Total critical care time: 40 minutes  Critical care time was exclusive of separately billable procedures and treating other patients.  Critical care was necessary to treat or prevent imminent or life-threatening deterioration.  Critical care was time spent personally by me on the following activities: development of treatment plan with patient and/or surrogate as well as nursing, discussions with consultants, evaluation of patient's response to treatment, examination of patient, obtaining history from patient or surrogate, ordering and performing treatments and interventions, ordering and review of laboratory studies, ordering and review of radiographic studies, pulse oximetry and re-evaluation of patient's condition.  Marca Ancona 09/12/2022 8:38 AM

## 2022-09-12 NOTE — Progress Notes (Signed)
Patient was scheduled for a change of Unna Boots. Patient refused application of Unna Boots after removing present pair. Will notify provider.

## 2022-09-12 NOTE — Progress Notes (Signed)
  PT Cancellation Note  Patient Details Name: Charles Holmes MRN: [id number] DOB: 1965-12-15   Cancelled Treatment:    Reason Eval/Treat Not Completed: Patient at procedure or test/unavailable. Pt off the unit at cath lab. PT to return as able to progress mobility.  [name], PT, DPT Acute Rehabilitation Services Secure chat preferred Office #: [phone]    [name] 09/12/2022, 11:30 AM

## 2022-09-12 NOTE — Progress Notes (Signed)
SLP Cancellation Note  Patient Details Name: Charles Holmes MRN: [id number] DOB: 03-17-1965   Cancelled treatment:       Reason Eval/Treat Not Completed: Patient at procedure or test/unavailable   Kasheem Toner, Riley Nearing 09/12/2022, 10:55 AM

## 2022-09-12 NOTE — Progress Notes (Signed)
ANTICOAGULATION CONSULT NOTE  Pharmacy Consult for heparin + warfarin Indication:  LVAD  Allergies  Allergen Reactions   Amiodarone     Severe tremors   Percocet [Oxycodone-Acetaminophen] Itching    Patient Measurements: Height: 5\' 7"  (170.2 cm) Weight: 77.4 kg (170 lb 10.2 oz) IBW/kg (Calculated) : 66.1 Heparin Dosing Weight: 87kg  Vital Signs: Temp: 99.3 F (37.4 C) (08/27 0400) Temp Source: Oral (08/27 0400) BP: 86/61 (08/27 0645) Pulse Rate: 216 (08/27 0430)  Labs: Recent Labs    09/10/22 0346 09/10/22 0350 09/10/22 0751 09/11/22 0500 09/11/22 2122 09/12/22 0353 09/12/22 0357  HGB 8.7*  --   --  8.6*  --  8.5*  --   HCT 28.1*  --   --  28.1*  --  27.9*  --   PLT 471*  --   --  488*  --  383  --   LABPROT 17.5*  --   --  16.8*  --  16.9*  --   INR 1.4*  --   --  1.3*  --  1.4*  --   HEPARINUNFRC  --    < > <0.10* <0.10*  --   --  <0.10*  CREATININE 0.74  --   --  0.75 0.68 0.79  --    < > = values in this interval not displayed.    Estimated Creatinine Clearance: 93 mL/min (by C-G formula based on SCr of 0.79 mg/dL).   Medical History: Past Medical History:  Diagnosis Date   CAD (coronary artery disease)    CHF (congestive heart failure) (HCC)    GERD (gastroesophageal reflux disease)    Hyperlipidemia    Hypertension    Systolic heart failure (HCC) 2021   LVEF 18%, RVEF 38% on cardiac MRI 12/19/2019. possible cardiac sarcoidosis.   Wide-complex tachycardia 2021   wears LifeVest     Assessment: 57yoM on apixaban PTA for hx AF admitted for LVAD workup. Pt s/p HM3 implant on 8/13 c/b acute CVA postop. Head CT 8/17 stable, ok to start low dose heparin per neuro, warfarin started 8/19.  Heparin level <0.1 as expected, CBC and LDH stable, INR remains low at 1.4.   Goal of Therapy:  Heparin level <0.1 units/ml Monitor platelets by anticoagulation protocol: Yes   Plan:  Continue Heparin 500 units/h - no up titrations  Warfarin 5mg  x1  tonight Daily INR, heparin level, CBC  Trixie Rude, PharmD Clinical Pharmacist 09/12/2022  7:14 AM  Please check AMION for all [facility] phone numbers After 10:00 PM, call Main Pharmacy [phone]

## 2022-09-13 ENCOUNTER — Other Ambulatory Visit (HOSPITAL_COMMUNITY): Payer: Self-pay | Admitting: Internal Medicine

## 2022-09-13 ENCOUNTER — Encounter (HOSPITAL_COMMUNITY): Payer: Self-pay | Admitting: Cardiology

## 2022-09-13 DIAGNOSIS — R570 Cardiogenic shock: Secondary | ICD-10-CM | POA: Diagnosis not present

## 2022-09-13 DIAGNOSIS — I5023 Acute on chronic systolic (congestive) heart failure: Secondary | ICD-10-CM | POA: Diagnosis not present

## 2022-09-13 DIAGNOSIS — I428 Other cardiomyopathies: Secondary | ICD-10-CM | POA: Diagnosis not present

## 2022-09-13 LAB — LACTATE DEHYDROGENASE: LDH: 316 U/L — ABNORMAL HIGH (ref 98–192)

## 2022-09-13 LAB — URINALYSIS, W/ REFLEX TO CULTURE (INFECTION SUSPECTED)
Bilirubin Urine: NEGATIVE
Glucose, UA: NEGATIVE mg/dL
Hgb urine dipstick: NEGATIVE
Ketones, ur: NEGATIVE mg/dL
Leukocytes,Ua: NEGATIVE
Nitrite: NEGATIVE
Protein, ur: 100 mg/dL — AB
Specific Gravity, Urine: 1.018 (ref 1.005–1.030)
pH: 6 (ref 5.0–8.0)

## 2022-09-13 LAB — COMPREHENSIVE METABOLIC PANEL
ALT: 138 U/L — ABNORMAL HIGH (ref 0–44)
AST: 119 U/L — ABNORMAL HIGH (ref 15–41)
Albumin: 2.6 g/dL — ABNORMAL LOW (ref 3.5–5.0)
Alkaline Phosphatase: 146 U/L — ABNORMAL HIGH (ref 38–126)
Anion gap: 10 (ref 5–15)
BUN: 37 mg/dL — ABNORMAL HIGH (ref 6–20)
CO2: 29 mmol/L (ref 22–32)
Calcium: 8.3 mg/dL — ABNORMAL LOW (ref 8.9–10.3)
Chloride: 94 mmol/L — ABNORMAL LOW (ref 98–111)
Creatinine, Ser: 0.9 mg/dL (ref 0.61–1.24)
GFR, Estimated: >60 mL/min (ref >60)
Glucose, Bld: 167 mg/dL — ABNORMAL HIGH (ref 70–99)
Potassium: 3.8 mmol/L (ref 3.5–5.1)
Sodium: 133 mmol/L — ABNORMAL LOW (ref 135–145)
Total Bilirubin: 0.8 mg/dL (ref 0.3–1.2)
Total Protein: 6.1 g/dL — ABNORMAL LOW (ref 6.5–8.1)

## 2022-09-13 LAB — PROTIME-INR
INR: 1.5 — ABNORMAL HIGH (ref 0.8–1.2)
Prothrombin Time: 18 s — ABNORMAL HIGH (ref 11.4–15.2)

## 2022-09-13 LAB — COOXEMETRY PANEL
Carboxyhemoglobin: 0.9 % (ref 0.5–1.5)
Methemoglobin: 2.2 % — ABNORMAL HIGH (ref 0.0–1.5)
O2 Saturation: 58.5 %
Total hemoglobin: 9.2 g/dL — ABNORMAL LOW (ref 12.0–16.0)

## 2022-09-13 LAB — GLUCOSE, CAPILLARY
Glucose-Capillary: 119 mg/dL — ABNORMAL HIGH (ref 70–99)
Glucose-Capillary: 119 mg/dL — ABNORMAL HIGH (ref 70–99)
Glucose-Capillary: 128 mg/dL — ABNORMAL HIGH (ref 70–99)
Glucose-Capillary: 140 mg/dL — ABNORMAL HIGH (ref 70–99)
Glucose-Capillary: 154 mg/dL — ABNORMAL HIGH (ref 70–99)
Glucose-Capillary: 87 mg/dL (ref 70–99)

## 2022-09-13 LAB — CBC
HCT: 28.3 % — ABNORMAL LOW (ref 39.0–52.0)
Hemoglobin: 8.4 g/dL — ABNORMAL LOW (ref 13.0–17.0)
MCH: 29.3 pg (ref 26.0–34.0)
MCHC: 29.7 g/dL — ABNORMAL LOW (ref 30.0–36.0)
MCV: 98.6 fL (ref 80.0–100.0)
Platelets: 517 10*3/uL — ABNORMAL HIGH (ref 150–400)
RBC: 2.87 MIL/uL — ABNORMAL LOW (ref 4.22–5.81)
RDW: 17 % — ABNORMAL HIGH (ref 11.5–15.5)
WBC: 13.2 10*3/uL — ABNORMAL HIGH (ref 4.0–10.5)
nRBC: 0 % (ref 0.0–0.2)

## 2022-09-13 LAB — HEPARIN LEVEL (UNFRACTIONATED): Heparin Unfractionated: <0.1 [IU]/mL — ABNORMAL LOW (ref 0.30–0.70)

## 2022-09-13 LAB — MAGNESIUM: Magnesium: 2.2 mg/dL (ref 1.7–2.4)

## 2022-09-13 MED ORDER — POTASSIUM CHLORIDE CRYS ER 20 MEQ PO TBCR
40.0000 meq | EXTENDED_RELEASE_TABLET | Freq: Once | ORAL | Status: AC
  Administered 2022-09-13: 40 meq via ORAL
  Filled 2022-09-13: qty 2

## 2022-09-13 MED ORDER — QUETIAPINE FUMARATE 25 MG PO TABS
25.0000 mg | ORAL_TABLET | Freq: Every day | ORAL | Status: DC
  Administered 2022-09-13: 25 mg via ORAL
  Filled 2022-09-13: qty 1

## 2022-09-13 MED ORDER — GERHARDT'S BUTT CREAM
TOPICAL_CREAM | Freq: Three times a day (TID) | CUTANEOUS | Status: DC
  Filled 2022-09-13 (×3): qty 1

## 2022-09-13 MED ORDER — FUROSEMIDE 10 MG/ML IJ SOLN
80.0000 mg | Freq: Once | INTRAMUSCULAR | Status: AC
  Administered 2022-09-13: 80 mg via INTRAVENOUS
  Filled 2022-09-13: qty 8

## 2022-09-13 MED ORDER — FUROSEMIDE 10 MG/ML IJ SOLN: 80.0000 mg | Freq: Two times a day (BID) | INTRAMUSCULAR | Status: DC

## 2022-09-13 MED ORDER — MIRTAZAPINE 15 MG PO TBDP
7.5000 mg | ORAL_TABLET | Freq: Every day | ORAL | Status: DC
  Administered 2022-09-13 – 2022-09-26 (×14): 7.5 mg via ORAL
  Filled 2022-09-13 (×16): qty 0.5

## 2022-09-13 MED ORDER — WARFARIN SODIUM 5 MG PO TABS
5.0000 mg | ORAL_TABLET | Freq: Once | ORAL | Status: AC
  Administered 2022-09-13: 5 mg via ORAL
  Filled 2022-09-13: qty 1

## 2022-09-13 NOTE — Progress Notes (Signed)
CSW attempted to visit patient at bedside although he was resting comfortable. No family present at time of visit. CSW continues to follow. Lasandra Beech, LCSW, CCSW-MCS [phone]

## 2022-09-13 NOTE — Progress Notes (Signed)
This chaplain is present with the Pt. for F/U spiritual care after reviewing the Pt. chart notes and receiving updates from RNs [place] and [place]. The Pt. responded to his name and the chaplain's invitation for a visit.  The chaplain listened reflectively to the Pt.'s family updates and description of today. The chaplain understands the Pt. loving thoughts are often centered on his grandson and the expected arrival of a granddaughter. After listening to the Pt. reflect on the events of the day, the chaplain asked what do you want to change? The chaplain observed the  Pt. positive response to the invitation through repositioning in the bed and willingness to talk with the team. The chaplain understands the Pt. moved himself to the center of his care from the outside of his care when he agreed to tell the RNs and chaplain what is on his mind.   The chaplain is appreciative of the RNs willingness to participate in the bedside conversation and offer positive options for meeting the Pt. in the middle of his care. The time together included discussions on rest, therapy, nutrition, finances, and his personal goals.    The chaplain and Pt. made plans for their next visit and ended the time together with prayer.  Chaplain Stephanie Acre [phone]

## 2022-09-13 NOTE — Progress Notes (Signed)
LVAD Coordinator Rounding Note:  Admitted 08/10/22 due to acute on chronic CHF with cardiogenic shock. Milrinone dependent. Advance therapy workup completed, and pt deemed acceptable VAD candidate. Dental extractions completed 8/6. IABP placed 08/25/22.  HM 3 LVAD implanted on 08/29/22 by Dr Donata Clay under destination therapy criteria. 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 Result negative.  7/25 Admit with cardiogenic shock. Started milrinone and NE. 8/6 S/P 13 teeth extractions  8/9 IABP placed 8/13 S/p HM III LVAD implant + clipping left atrial appendage d/t severe thickening and invagination of mitral valve annulus impeding flows  CT Head 8/14 (initial) 1. 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 ASPECTS is 7. 2. No acute hemorrhage.  CT Angio Head/Neck 1. 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 2. Core infarct of 12 cc (somewhat underestimated compared to aspects) with 90 cc of penumbra. 3. Mild atherosclerosis.  Pt taken emergently to IR for percutaneous right common carotid arteriogram with thrombectomy. Revascularization achieved. Angio-seal closure device applied to left groin- clean, dry, and intact.   CT Head 8/15 @ 0450 Unchanged extent of infarct and hemorrhage in the right MCA distribution including small volume intraventricular clot. No hydrocephalus.  CT Head 8/17 @ 0701 Interval evolution of the right MCA territory infarct with decreased intraparenchymal and intraventricular hemorrhage. No hydrocephalus or midline shift.   Pt lying in bed on my arrival. Denies complaints. RHC completed yesterday- numbers improving. Epi turned off this morning. Milrinone decreased to 0.125 mcg/kg/min. Pacing wires in place- to remain until Heparin drip stopped per Dr Donata Clay.   Tmax overnight- 101. Blood cultures drawn this morning.   Pt progressing with PT/OT/SLP. CIR consulted once medically stable.   VAD  coordinator spoke with pt's daughter Dorathy Daft regarding need to begin discharge teaching. Dorathy Daft and [name] will plan to be at bedside for teaching at 10:00 tomorrow.    Vital signs: Temp: 99.3 HR: 109 Doppler Pressure: 78 Automatic BP: 92/66 (76) O2 Sat: 93% on RA Wt: 183.6>191.1>190.9>190.2>184.8>178.8>178.1>172.4>170.6>173.2 lbs   LVAD interrogation reveals:  Speed: 5400 Flow: 4.8 Power: 3.9 w PI: 2.6  Alarms: none Events: none  Hematocrit: 28 Fixed speed: 5400 Low speed limit: 5100  Drive Line:  Existing VAD dressing removed and site care performed using sterile technique. Drive line exit site cleaned with Chlora prep applicators x 2, allowed to dry, and silver strip with gauze applied. Exit site healing and unincorporated, the velour is fully implanted at exit site.  Scant amount of serous drainage. Slight redness, no tenderness, foul odor or rash noted. 2 suture in place. Drive line anchor secure. Blisters and skin breakdown resolved. Continue Monday/ Wednesday/Friday dressing changes. Per Dr Donata Clay will transition to VASHE wet to dry dressing changes without silver strip with next dressing change. Next dressing change due 09/15/22 by VAD coordinator or nurse champion only.     Labs:  LDH trend: 596>441>165>384>345>312>312>350>327>309>316  INR trend: 1.4>1.6>1.1>1.1>1.3>1.2>1>1.3>1.4>1.5  AST/ALT trend: 218/45>96/30>73/23>63/36>58/37>69/50>93/72>123/103>143/150>117/138>119/138  Total Bili trend: 4.4>4.6>5.1>1.8>1.9>1.2>1.0>1.5>1.1>0.8  WBC trend: 11.6>13.7>19.6>16.1>23>26.6>21>24.6>18.1>16.5>13.2  Anticoagulation Plan: -INR Goal: 2.0 - 2.5 -ASA Dose: 325 mg  -Heparin 500 u/hr - Coumadin dosing per pharmacy  Blood Products:  IntraOp 8/14: - 4 FFP - 2 Platelets - 2 PRBC - 1 cyro - 449 cc of cellsaver - DDAVP 20 mcg x 1   Device: Medtronic BiV -  -Therapies: OFF  Arrythmias:   Respiratory: RA  Infection:  09/01/22>> sputum cx>> NGTD>>final 09/01/22>>  blood cx>> NGTD>>final 09/01/22>> urine  cx>> NGTD>> final 09/13/22>>blood cxs>>pending  Renal:  8/22: BUN/CRT: 28/0.75  Adverse Events on VAD: - 08/30/22: - Developed left sided weakness this am. CTA with R MCA infarct. Taken to IR for thrombectomy   Drips:  Epinephrine 1 mcg/min--off Milrinone 0.125 mcg/kg/min  Patient Education: Discussed and demonstrated power source change with pt. Left extremity too weak at this time to perform power change on his own.  No family at bedside- will plan to start discharge teaching with Dorathy Daft and [name] tomorrow.  Plan/Recommendations:  Please page VAD coordinator for any alarms or VAD equipment issues. Continue MWF dressing changes by VAD coordinator or Nurse Alla Feeling.    [name] RN VAD Coordinator  Office: [phone]  24/7 Pager: [phone]

## 2022-09-13 NOTE — Progress Notes (Signed)
Physical Therapy Treatment Patient Details Name: Charles Holmes MRN: [id number] DOB: [birth year] Today's Date: 09/13/2022   History of Present Illness Pt is 57 year old presented to [facility] on  08/10/22 for acute on chronic systolic heart failure. Pt had IABP placed on 08/25/22 for ongoing cardiogenic shock. Underwent placement of LVAD on 8/13. Developed lt sided weakness on 8/14. Rt MCA CVA. Underwent mechanical thrombectomy by IR. Extubated 8/15.  PMH - CAD, CHF, HLD, HTN, systolic HF, ICD implant, rt CVA.  LVAD placement and new stroke R MCA M1.    PT Comments  Pt with depressed spirits, lethargy, and increased fatigue this date. Pt warm to touch with noted low grade fever as well. Pt did consent to getting up in chair. Pt modAx2 with step pvt transfer without use of stedy this date. Attempted to engage in LE exercises however pt unable to stay engaged due to lethargy. Acute PT to cont to follow.    If plan is discharge home, recommend the following:     Can travel by private vehicle        Equipment Recommendations  Other (comment) (To be determined)    Recommendations for Other Services       Precautions / Restrictions Precautions Precautions: Sternal;Fall Precaution Comments: LVAD Restrictions Weight Bearing Restrictions: Yes RUE Weight Bearing: Non weight bearing RLE Weight Bearing: Non weight bearing Other Position/Activity Restrictions: Sternal Precautions     Mobility  Bed Mobility Overal bed mobility: Needs Assistance Bed Mobility: Supine to Sit   Sidelying to sit: Mod assist, HOB elevated       General bed mobility comments: for assist with trunk and BLE    Transfers Overall transfer level: Needs assistance Equipment used: 2 person hand held assist (face to face transfer with bed pad) Transfers: Sit to/from Stand, Bed to chair/wheelchair/BSC Sit to Stand: Mod assist, +2 physical assistance   Step pivot transfers: Mod assist, +2 physical assistance        General transfer comment: pt with good power up, worked on achieving bilat knee extension in standing,L knee blocked however able to maintain extension in standing but requires support when advancing R LE. Pt requiring step by step directional cues to sequence stepping and modA for weight shifting    Ambulation/Gait                   Stairs             Wheelchair Mobility     Tilt Bed    Modified Rankin (Stroke Patients Only) Modified Rankin (Stroke Patients Only) Pre-Morbid Rankin Score: No symptoms Modified Rankin: Moderately severe disability     Balance Overall balance assessment: Needs assistance Sitting-balance support: Feet supported, Single extremity supported Sitting balance-Leahy Scale: Poor Sitting balance - Comments: pt very sleepy today and unable to sit upright without support, pt repeatedly leaning down on R elbow on elevated HOB   Standing balance support: Single extremity supported Standing balance-Leahy Scale: Poor Standing balance comment: reliant on external support and Stedy, standing tolerance limited by onset of low back pain however was able to achieve full upright posture                            Cognition Arousal: Lethargic Behavior During Therapy: Flat affect Overall Cognitive Status: Impaired/Different from baseline Area of Impairment: Problem solving, Awareness, Following commands, Safety/judgement, Memory  Memory: Decreased recall of precautions (mod cues to maintain during session, although pt can verbalize) Following Commands: Follows one step commands with increased time Safety/Judgement: Decreased awareness of safety, Decreased awareness of deficits Awareness: Emergent Problem Solving: Slow processing, Requires verbal cues, Difficulty sequencing General Comments: pt with depressed spirts, reports "I am just so tired."        Exercises General Exercises - Lower Extremity Ankle  Circles/Pumps: AROM, Both, 10 reps, Supine Quad Sets: AROM, Both, Supine, 5 reps Long Arc Quad: AROM, Left, 10 reps, Seated (with 3 sec hold at full extension) Heel Slides: AAROM, Left, Seated, 10 reps (against manual resistance with L foot on washcloth on floor) Heel Raises:  (against manual pressure)    General Comments General comments (skin integrity, edema, etc.): BP soft 82/71 (map 76), HR 104, pump flow 4.9-5, pump speed 5450-5500, pump power 4.0-4.1      Pertinent Vitals/Pain Pain Assessment Pain Assessment: No/denies pain    Home Living                          Prior Function            PT Goals (current goals can now be found in the care plan section) Acute Rehab PT Goals PT Goal Formulation: With patient Time For Goal Achievement: 09/14/22 Potential to Achieve Goals: Fair Progress towards PT goals: Not progressing toward goals - comment (lethargy, depressed spirits)    Frequency    Min 1X/week      PT Plan Current plan remains appropriate    Co-evaluation              AM-PAC PT "6 Clicks" Mobility   Outcome Measure  Help needed turning from your back to your side while in a flat bed without using bedrails?: A Lot Help needed moving from lying on your back to sitting on the side of a flat bed without using bedrails?: A Lot Help needed moving to and from a bed to a chair (including a wheelchair)?: Total Help needed standing up from a chair using your arms (e.g., wheelchair or bedside chair)?: Total Help needed to walk in hospital room?: Total Help needed climbing 3-5 steps with a railing? : Total 6 Click Score: 8    End of Session   Activity Tolerance: Patient limited by fatigue;Patient limited by lethargy Patient left: with call bell/phone within reach;in chair;with chair alarm set Nurse Communication: Mobility status PT Visit Diagnosis: Other abnormalities of gait and mobility (R26.89);Difficulty in walking, not elsewhere classified  (R26.2);Hemiplegia and hemiparesis Hemiplegia - Right/Left: Left Hemiplegia - dominant/non-dominant: Non-dominant Hemiplegia - caused by: Cerebral infarction     Time: 1610-9604 PT Time Calculation (min) (ACUTE ONLY): 29 min  Charges:    $Therapeutic Exercise: 8-22 mins $Therapeutic Activity: 8-22 mins PT General Charges $$ ACUTE PT VISIT: 1 Visit                     [name], PT, DPT Acute Rehabilitation Services Secure chat preferred Office #: [phone]    [name] 09/13/2022, 12:31 PM

## 2022-09-13 NOTE — Progress Notes (Signed)
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 Off anticoagulation now  due to mild hemorrhagic extension by f/u CT.  Patient was started on IV antibiotics postop day 6 for elevated white count and fever.  Cultures are negative.  Chest x-ray shows only mild atelectasis no significant effusion.  Sternal incision looks clearing and dry.   Patient had brief temperature elevation at 11 PM last night.  White count remains only minimally elevated and cultures are pending.  PharmD is slowly loading coumadin with low dose heparin bridge.  INR currently 1.5, coumadin dose 5 mg daily  Right heart cath yesterday shows low filling pressures with good cardiac output.  Weaning off epinephrine.  Patient appears depressed and intermittently he is declining did mobilization.  Will start low-dose Remeron.  LVAD INTERROGATION:  HeartMate II LVAD:  Flow 4.3  liters/min, speed 5300 rpm, power 3.6, PI 4.1.  Controller intact.   Objective:    Vital Signs:   Temp:  [98 F (36.7 C)-101 F (38.3 C)] 99.3 F (37.4 C) (08/28 0800) Pulse Rate:  [88-218] 110 (08/28 0800) Resp:  [17-27] 25 (08/28 0800) BP: (64-101)/(48-87) 79/68 (08/28 0800) SpO2:  [85 %-97 %] 93 % (08/28 0430) Weight:  [78.6 kg] 78.6 kg (08/28 0452) Last BM Date :  09/12/22 Mean arterial Pressure 88-92 mm Hg  Intake/Output:  Exam  General -alert moving L side better, speech normal, drinking Ensure and tolerating mechanical soft solid diet HEENT:-No bleeding from mouth at extraction sits Neck: supple. JVP . Carotids without bruits. No lymphadenopathy or thryomegaly appreciated. Cor: Mechanical heart sounds with LVAD hum present. Lungs: clear Abdomen: soft, nontender, nondistended. No hepatosplenomegaly. No bruits or masses. Good bowel sounds. Extremities: no cyanosis, clubbing, rash, edema Neuro: alert & orientedx3, cranial nerves grossly intact. moves all 4 extremities .Improving L arm,leg motor function.  Telemetry:paced with PPM 90/min  Labs: Basic Metabolic Panel: Recent Labs  Lab 09/10/22 0346 09/11/22 0500 09/11/22 2122 09/12/22 0353 09/12/22 1108 09/13/22 0351  NA 131* 133* 134* 133* 140  138 133*  K 4.3 3.6 3.3* 4.1 3.3*  3.5 3.8  CL 96* 96* 92* 94*  --  94*  CO2 26 29 30  33*  --  29  GLUCOSE 196* 157* 147* 137*  --  167*  BUN 32* 33* 33* 36*  --  37*  CREATININE 0.74 0.75 0.68 0.79  --  0.90  CALCIUM 7.9* 8.1* 8.1* 8.2*  --  8.3*  MG 2.0 2.2 1.9 2.1  --  2.2    Liver Function Tests: Recent Labs  Lab 09/09/22 0516 09/10/22 0346 09/11/22 0500 09/12/22 0353 09/13/22 0351  AST 113* 146* 143* 117* 119*  ALT 103* 138* 150* 138* 138*  ALKPHOS 168* 159* 140* 131* 146*  BILITOT 1.0 0.9 1.1 1.0 0.8  PROT 5.9* 5.9* 6.1* 6.2* 6.1*  ALBUMIN 2.2* 2.0* 2.3* 2.5* 2.6*   No results for input(s): "LIPASE", "AMYLASE"  in the last 168 hours. No results for input(s): "AMMONIA" in the last 168 hours.  CBC: Recent Labs  Lab 09/09/22 0516 09/10/22 0346 09/11/22 0500 09/12/22 0353 09/12/22 1108 09/13/22 0351  WBC 22.2* 22.5* 18.1* 16.5*  --  13.2*  HGB 8.5* 8.7* 8.6* 8.5* 9.9*  10.2* 8.4*  HCT 27.4* 28.1* 28.1* 27.9* 29.0*  30.0* 28.3*  MCV 101.5* 100.4* 97.6 98.2  --  98.6  PLT 425* 471* 488* 383  --  517*     INR: Recent Labs  Lab 09/09/22 0516 09/10/22 0346 09/11/22 0500 09/12/22 0353 09/13/22 0351  INR 1.2 1.4* 1.3* 1.4* 1.5*    Other results: EKG:   Imaging: CARDIAC CATHETERIZATION  Result Date: 09/12/2022 1. Low filling pressures. 2. Excellent cardiac output on current support. 3. Good PAPi Will start weaning down on milrinone, hopefully will allow more room to wean epinephrine.   DG Chest Port 1 View  Result Date: 09/12/2022 CLINICAL DATA:  LVAD, history of CHF EXAM: PORTABLE CHEST 1 VIEW COMPARISON:  09/11/2022 FINDINGS: Left AICD and LVAD remain in place, unchanged. Right PICC line and feeding tube remain in place, unchanged. Cardiomegaly. Focal left lower lobe atelectasis or infiltrate, similar to prior study. Right lung clear. No visible effusions. IMPRESSION: Support devices stable. Left lower lobe atelectasis or infiltrate, similar to prior study. Electronically Signed   By: Charlett Nose M.D.   On: 09/12/2022 10:23     Medications:     Scheduled Medications:  sodium chloride   Intravenous Once   aspirin  324 mg Oral Daily   atorvastatin  80 mg Oral Daily   Chlorhexidine Gluconate Cloth  6 each Topical Q0600   digoxin  0.125 mg Oral Daily   dorzolamide-timolol  1 drop Both Eyes BID   feeding supplement  237 mL Oral TID BM   feeding supplement (PROSource TF20)  60 mL Per Tube BID   feeding supplement (VITAL 1.5 CAL)  780 mL Per Tube Q24H   fiber supplement (BANATROL TF)  60 mL Per Tube TID   insulin aspart  0-24 Units Subcutaneous TID WC & HS   latanoprost  1 drop Both Eyes QHS   melatonin  3 mg Oral QHS   mexiletine  250 mg Oral BID   mirtazapine  7.5 mg Oral QHS   mometasone-formoterol  2 puff Inhalation BID   multivitamin with minerals  1 tablet Oral Daily   mouth rinse  15 mL Mouth Rinse 4 times per day   pantoprazole (PROTONIX) IV  40 mg Intravenous QHS   potassium chloride  40 mEq Oral Once   QUEtiapine  25 mg Oral QHS   sildenafil  20 mg Oral TID    sodium chloride flush  3 mL Intravenous Q12H   umeclidinium bromide  1 puff Inhalation Daily   warfarin  5 mg Oral ONCE-1600   Warfarin - Pharmacist Dosing Inpatient   Does not apply q1600    Infusions:  sodium chloride Stopped (09/08/22 1239)   epinephrine Stopped (09/13/22 0655)   heparin 500 Units/hr (09/13/22 1000)   milrinone 0.125 mcg/kg/min (09/13/22 1000)    PRN Medications: sodium chloride, acetaminophen, albuterol, dextrose, ondansetron (ZOFRAN) IV, mouth rinse, oxyCODONE, polyethylene glycol, sodium chloride, sodium chloride flush, traMADol   Assessment:  57 year old patient with sarcoidosis, COPD history of stroke  and nonischemic cardiomyopathy with preoperative cardiogenic shock requiring balloon pump and inotropic therapy.  Postoperative  right middle cerebral artery CVA treated with neuro-- radiology invention,   Postop RV  dysfunction req inhaled NO and inotropes with incresad bilirubin 1.2>> 4.9 Now off inhaled NO and bilirubin improving Patient has been diuresed down to preop weight. Still requiring low-dose epinephrine and milrinone. Repeat echo and right heart cath to assess RV function. Plan/Discussion:   RV dysfunction status post VAD implant. Extended inotropic support may be needed as per advanced heart failure team  Left side weakness from recurrent CVA. Working with physical therapy, ultimate goal will be CIR. Tube feeds at night, encourage PO intake- swallow function ok after CVA Depression post VAD implant-will start on low-dose Remeron.   Remove EPWs when off iv heparin, INR reasonable  I reviewed the LVAD parameters from today, and compared the results to the patient's prior recorded data.  No programming changes were made.  The LVAD is functioning within specified parameters.  The patient performs LVAD self-test daily.  LVAD interrogation was negative for any significant power changes, alarms or PI events/speed drops.  LVAD equipment check completed  and is in good working order.  Back-up equipment present.   LVAD education done on emergency procedures and precautions and reviewed exit site care.  Length of Stay: [address]  Lovett Sox 09/13/2022, 11:05 AM

## 2022-09-13 NOTE — Progress Notes (Addendum)
Patient ID: Charles Holmes, male   DOB: 1965/07/28, 57 y.o.   MRN: [id number]     Advanced Heart Failure Rounding Note  PCP-Cardiologist: Norman Herrlich, MD  [facility]: Dr. Gala Romney   Subjective:    7/25: Admit with cardiogenic shock. Started milrinone and NE. 8/6 S/P 13 teeth extractions  8/9 IABP placed 8/13 S/p HM III LVAD implant + clipping left atrial appendage d/t severe thickening and invagination of mitral valve annulus impeding flows 8/14 Left-sided hemiplegia. CT head with acute R MCA infarct. IR for thrombectomy. CT head with small to moderate area of hemorrhagic conversion 8/15 extubated  8/17 Low dose heparin gtt restarted, CT head with decreased size of hemorrhagic stroke.  8/18 Neurology signed off 8/19: Limited echo: Mod RV dysfunction, better mitral inflow into the LV and VAD position satisfactory. 8/26: Echo showed moderate-severe RV dysfunction but RV not enlarged.  Interventricular septum does bow towards towards the right. Speed increased to 5400 rpm, flow rose to 4.9 L/min.  Interventricular septum remained stable bowing slightly towards the right.    8/27: RHC with RA mean 5, PA 30/7, mean PCWP 4, CI 3.49, PAPi 4.6  Post Op Day #14  Epinephrine down to 1.  MAP generally in 70s-80s now.  Milrinone 0.25.  Co-ox 59% this morning.  Diuresed with Lasix 80 mg IV.  -2.6L UOP. Weight up 3lbs. CVP 12/13. tMax 101 yesterday  Had a good day yesterday.   LVAD Interrogation HM III: Speed: 5400 Flow: 4.8  PI: 2.4 Power: 4. No PI events   Objective:   Weight Range: 78.6 kg Body mass index is 27.14 kg/m.   Vital Signs:   Temp:  [98 F (36.7 C)-101 F (38.3 C)] 99.8 F (37.7 C) (08/28 0700) Pulse Rate:  [0-221] 109 (08/28 0015) Resp:  [14-25] 23 (08/28 0600) BP: (64-101)/(48-87) 82/67 (08/28 0600) SpO2:  [85 %-97 %] 93 % (08/28 0430) Weight:  [78.6 kg] 78.6 kg (08/28 0452) Last BM Date : 09/12/22  Weight change: Filed Weights   09/11/22 0645 09/12/22 0500 09/13/22  0452  Weight: 78.2 kg 77.4 kg 78.6 kg    Intake/Output:   Intake/Output Summary (Last 24 hours) at 09/13/2022 0711 Last data filed at 09/13/2022 0600 Gross per 24 hour  Intake 1499.5 ml  Output 2650 ml  Net -1150.5 ml   Physical Exam  CVP 12/13 General:  weak appearing. No resp difficulty HEENT: Normal Neck: supple. JVP ~12. Carotids 2+ bilat; no bruits. No lymphadenopathy or thyromegaly appreciated. Cor: Mechanical heart sounds with LVAD hum present. Lungs: Clear, diminished bases Abdomen: soft, nontender, nondistended. No hepatosplenomegaly. No bruits or masses. Good bowel sounds. Driveline: C/D/I; securement device intact and driveline incorporated Extremities: no cyanosis, clubbing, rash, trace BLE edema., PICC RUE Neuro: alert & orientedx3, cranial nerves grossly intact. moves all 4 extremities w/o difficulty. Affect pleasant   Telemetry     ST with BiV pacing 100-110s (personally reviewed)   Labs    CBC Recent Labs    09/12/22 0353 09/12/22 1108 09/13/22 0351  WBC 16.5*  --  13.2*  HGB 8.5* 9.9*  10.2* 8.4*  HCT 27.9* 29.0*  30.0* 28.3*  MCV 98.2  --  98.6  PLT 383  --  517*    Basic Metabolic Panel Recent Labs    02/72/53 0353 09/12/22 1108 09/13/22 0351  NA 133* 140  138 133*  K 4.1 3.3*  3.5 3.8  CL 94*  --  94*  CO2 33*  --  29  GLUCOSE 137*  --  167*  BUN 36*  --  37*  CREATININE 0.79  --  0.90  CALCIUM 8.2*  --  8.3*  MG 2.1  --  2.2    BNP: BNP (last 3 results) Recent Labs    08/30/22 0258 09/04/22 2144 09/11/22 2252  BNP 922.3* 1,050.6* 933.0*     Imaging    CARDIAC CATHETERIZATION  Result Date: 09/12/2022 1. Low filling pressures. 2. Excellent cardiac output on current support. 3. Good PAPi Will start weaning down on milrinone, hopefully will allow more room to wean epinephrine.     Medications:     Scheduled Medications:  sodium chloride   Intravenous Once   aspirin  324 mg Oral Daily   atorvastatin  80 mg Oral  Daily   Chlorhexidine Gluconate Cloth  6 each Topical Q0600   digoxin  0.125 mg Oral Daily   dorzolamide-timolol  1 drop Both Eyes BID   feeding supplement  237 mL Oral TID BM   feeding supplement (PROSource TF20)  60 mL Per Tube BID   feeding supplement (VITAL 1.5 CAL)  780 mL Per Tube Q24H   fiber supplement (BANATROL TF)  60 mL Per Tube TID   insulin aspart  0-24 Units Subcutaneous TID WC & HS   latanoprost  1 drop Both Eyes QHS   melatonin  3 mg Oral QHS   mexiletine  250 mg Oral BID   mometasone-formoterol  2 puff Inhalation BID   multivitamin with minerals  1 tablet Oral Daily   mouth rinse  15 mL Mouth Rinse 4 times per day   pantoprazole (PROTONIX) IV  40 mg Intravenous QHS   potassium chloride  40 mEq Oral Once   QUEtiapine  50 mg Oral QHS   sildenafil  20 mg Oral TID   sodium chloride flush  3 mL Intravenous Q12H   umeclidinium bromide  1 puff Inhalation Daily   warfarin  5 mg Oral ONCE-1600   Warfarin - Pharmacist Dosing Inpatient   Does not apply q1600    Infusions:  sodium chloride Stopped (09/08/22 1239)   epinephrine 1 mcg/min (09/13/22 0600)   heparin 500 Units/hr (09/13/22 0600)   milrinone 0.25 mcg/kg/min (09/13/22 0600)    PRN Medications: sodium chloride, acetaminophen, albuterol, dextrose, ondansetron (ZOFRAN) IV, mouth rinse, oxyCODONE, polyethylene glycol, sodium chloride, sodium chloride flush, traMADol   Patient Profile  Charles Holmes is a 57 y.o. 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 Admitted with cardiogenic shock, stabilized and underwent HM3 LVAD. Post implant course c/b acute CVA.   Assessment/Plan  1.  Acute on chronic Systolic HF-->Cardiogenic Shock  - Diagnosed 11/2019. Presented with VT. LHC 70% LAD  - cMRI 12/21 concerning for sarcoid and EF 18%.  - PET 2/22 at [facility] EF 25% + active sarcoid - Echo 08/26/20 EF < 20% severely dilated LV RV mildly decreased.  - [employer]  CRT-D upgrade in 06/08/21 - Echo 07/10/22: EF <20%, RV okay, mod pericardial effusion, mod Charles/TR - Admitted 07/25 with cardiogenic shock. - RHC: Nonobstructive CAD, severely elevated filling pressures and low Fick CO/CI (2.7/1.4) - 08/13 HM III LVAD implant + clipping LAA d/t severe thickening and invagination of mitral valve annulus impeding flows.  - Apical core sent to pathology to confirm diagnosis of cardiac sarcoid >> pending - Speed increased to 5300 on 08/14.  - Echo 8/26 mod-sev RV dysfunction. Speed increased to 5400 8/26 -  This morning on epinephrine 1 mcg & milrinone 0.40mcg/kg/min. Wean milrinone>.0125  Digoxin added 8/26, continue sildenafil 20 tid. - RHC 8/27: low filing pressures with excellent cardiac output on EPI, PAPi 4.6 - CVP 12/13. Got 80 IV lasix yesterday. -2.6L UOP. Volume up again today. Will do 80 IV x1 today - ASA 325 + heparin gtt + warfarin, INR still low at 1.5. Drinking 4-5 ensures a day. Discussed to cutting back to 3/day.   2.  Acute stroke - Hx CVA 06/24 -Admitted 06/24 w/ R MCA stroke. S/p TPA and mechanical clot extraction. No residual deficits. Likely cardioembolic in setting of severe LV dysfunction. - Developed left sided weakness 08/14. CTA with R MCA infarct. Taken to IR for thrombectomy - Repeat CT head with small to moderate size hemorrhagic conversion. CT head this am stable. - D/w neuro, repeat head CT on 8/17 w/ improved hemorrhagic CVA, now back on low dose heparin per neurology.  - Back on warfarin - neurology signed off 09/03/22.  - C/w PT/OT.  - Continue ASA 325  - Has Cor-Trak and still getting nocturnal tube feeds, intake improving. On dysphagia 1 diet. Dietitian following   - will need CIR once medically stable for d/c (team following)   3. 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 Continue mexiletine  - now s/p ICD.   4. CAD - LHC 12/07/19 70-% LAD, no intervention - LHC 8/24 non obstructive CAD.  -  Continue statin. On aspirin for VAD.  5. Cardiac sarcoid - PET 2/22 at [facility] EF 25% + active sarcoid - Has completed prednisone.  - holding methotrexate w/ recent surgery  - Sent apical core to pathology to confirm diagnosis of cardiac sarcoid => not diagnostic of cardiac sarcoidosis.   6. 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 AKI - suspect cardiorenal, improved w/ inotropic support - Creatinine stable.  - follow BMP w/ diuresis    8. Iron deficiency anemia/ Post-op anemia - recent T sat 15%, scheduled for OP feraheme. Will complete inpatient  - Transfused 1 u RBCs 8/15 - Hgb 8.4 today.    9. Pulmonary  - PFTs with severe obstructive defect, response to bronchodilator. FEV1 1.04L, FEV1/FVC 48% - extubated 8/15  - Cultures NGTD.   10. Elevated LFTs - AST/ALT stably elevated, normal bilirubin.   11. ID - WBC ct 22.5 => 18>16.5.  - completed Merrem/vanc  - Cultures with NGTD - Incentive spirometer and flutter valve at bedside, encouraged to use  Continue to mobilize with PT.   Length of Stay: 75  Alen Bleacher, NP  09/13/2022, 7:11 AM  VAD Team Pager [phone] (7am - 7am)   Advanced Heart Failure Team Pager [phone] (M-F; 7a - 5p)  Please contact [facility] for night-coverage after hours (5p -7a ) and weekends on [email or web address]  Patient seen with NP, agree with the above note.   RHC yesterday with low filling pressures, good CI and PAPi.  We decreased milrinone to 0.25 and weaned off epinephrine.  I/Os net negative 1151 cc.  Co-ox 59% this morning, MAP 70s-80s.  CVP up some to 13 this morning.   Did not get out of bed yesterday.   General: Well appearing this am. NAD.  HEENT: Normal. Neck: Supple, JVP 10 cm. Carotids OK.  Cardiac:  Mechanical heart sounds with LVAD hum present.  Lungs:  CTAB, normal effort.  Abdomen:  NT, ND, no HSM. No bruits or masses. +  BS  LVAD exit site: Well-healed and incorporated. Dressing dry and intact. No erythema or drainage.  Stabilization device present and accurately applied. Driveline dressing changed daily per sterile technique. Extremities:  Warm and dry. No cyanosis, clubbing, rash, or edema.  Neuro:  Alert & oriented x 3. Cranial nerves grossly intact. Moves all 4 extremities w/o difficulty. Affect pleasant    Now off epinephrine, will decrease milrinone to 0.125 today.  LVAD parameters stable, continue current speed.    CVP low on RHC yesterday but 13 today.  Will give Lasix 80 mg IV x 1 and follow.   Tm 101 overnight, now afebrile.  He has completed a course of vancomycin/meropenem.  WBCs trending down at 13.  I will order blood, urine, and sputum cultures. Follow closely.  Driveline site looks ok.   INR 1.5, remains on heparin gtt/warfarin.   Needs to mobilize.   CRITICAL CARE Performed by: Marca Ancona  Total critical care time: 40 minutes  Critical care time was exclusive of separately billable procedures and treating other patients.  Critical care was necessary to treat or prevent imminent or life-threatening deterioration.  Critical care was time spent personally by me on the following activities: development of treatment plan with patient and/or surrogate as well as nursing, discussions with consultants, evaluation of patient's response to treatment, examination of patient, obtaining history from patient or surrogate, ordering and performing treatments and interventions, ordering and review of laboratory studies, ordering and review of radiographic studies, pulse oximetry and re-evaluation of patient's condition.  Marca Ancona 09/13/2022 7:42 AM

## 2022-09-13 NOTE — Progress Notes (Signed)
ANTICOAGULATION CONSULT NOTE  Pharmacy Consult for heparin + warfarin Indication:  LVAD  Allergies  Allergen Reactions   Amiodarone     Severe tremors   Percocet [Oxycodone-Acetaminophen] Itching    Patient Measurements: Height: 5\' 7"  (170.2 cm) Weight: 78.6 kg (173 lb 4.5 oz) IBW/kg (Calculated) : 66.1 Heparin Dosing Weight: 87kg  Vital Signs: Temp: 99.8 F (37.7 C) (08/28 0700) Temp Source: Oral (08/28 0700) BP: 82/67 (08/28 0600) Pulse Rate: 109 (08/28 0015)  Labs: Recent Labs    09/11/22 0500 09/11/22 2122 09/12/22 0353 09/12/22 0357 09/12/22 1108 09/13/22 0351  HGB 8.6*  --  8.5*  --  9.9*  10.2* 8.4*  HCT 28.1*  --  27.9*  --  29.0*  30.0* 28.3*  PLT 488*  --  383  --   --  517*  LABPROT 16.8*  --  16.9*  --   --  18.0*  INR 1.3*  --  1.4*  --   --  1.5*  HEPARINUNFRC <0.10*  --   --  <0.10*  --  <0.10*  CREATININE 0.75 0.68 0.79  --   --  0.90    Estimated Creatinine Clearance: 82.6 mL/min (by C-G formula based on SCr of 0.9 mg/dL).   Medical History: Past Medical History:  Diagnosis Date   CAD (coronary artery disease)    CHF (congestive heart failure) (HCC)    GERD (gastroesophageal reflux disease)    Hyperlipidemia    Hypertension    Systolic heart failure (HCC) 2021   LVEF 18%, RVEF 38% on cardiac MRI 12/19/2019. possible cardiac sarcoidosis.   Wide-complex tachycardia 2021   wears LifeVest     Assessment: 57yoM on apixaban PTA for hx AF admitted for LVAD workup. Pt s/p HM3 implant on 8/13 c/b acute CVA postop. Head CT 8/17 stable, ok to start low dose heparin per neuro, warfarin started 8/19.  Heparin level <0.1 as expected, CBC and LDH stable, INR remains low but trending up slowly to 1.5 (pt drinking lots of Ensures).   Goal of Therapy:  Heparin level <0.1 units/ml Monitor platelets by anticoagulation protocol: Yes   Plan:  Continue Heparin 500 units/h - no up titrations  Warfarin 5mg  x1 again tonight Daily INR, heparin level,  CBC  Fredonia Highland, PharmD, [place], [facility] Clinical Pharmacist [phone] Please check AMION for all [facility] numbers 09/13/2022

## 2022-09-14 ENCOUNTER — Encounter (HOSPITAL_COMMUNITY): Payer: Medicare Other

## 2022-09-14 ENCOUNTER — Inpatient Hospital Stay (HOSPITAL_COMMUNITY): Payer: Medicare Other

## 2022-09-14 DIAGNOSIS — I428 Other cardiomyopathies: Secondary | ICD-10-CM | POA: Diagnosis not present

## 2022-09-14 DIAGNOSIS — Z95811 Presence of heart assist device: Secondary | ICD-10-CM | POA: Diagnosis not present

## 2022-09-14 DIAGNOSIS — Z8673 Personal history of transient ischemic attack (TIA), and cerebral infarction without residual deficits: Secondary | ICD-10-CM | POA: Diagnosis not present

## 2022-09-14 DIAGNOSIS — I5023 Acute on chronic systolic (congestive) heart failure: Secondary | ICD-10-CM | POA: Diagnosis not present

## 2022-09-14 DIAGNOSIS — Z515 Encounter for palliative care: Secondary | ICD-10-CM | POA: Diagnosis not present

## 2022-09-14 LAB — GLUCOSE, CAPILLARY
Glucose-Capillary: 106 mg/dL — ABNORMAL HIGH (ref 70–99)
Glucose-Capillary: 112 mg/dL — ABNORMAL HIGH (ref 70–99)
Glucose-Capillary: 122 mg/dL — ABNORMAL HIGH (ref 70–99)
Glucose-Capillary: 124 mg/dL — ABNORMAL HIGH (ref 70–99)
Glucose-Capillary: 128 mg/dL — ABNORMAL HIGH (ref 70–99)
Glucose-Capillary: 156 mg/dL — ABNORMAL HIGH (ref 70–99)

## 2022-09-14 LAB — COMPREHENSIVE METABOLIC PANEL
ALT: 127 U/L — ABNORMAL HIGH (ref 0–44)
AST: 92 U/L — ABNORMAL HIGH (ref 15–41)
Albumin: 2.5 g/dL — ABNORMAL LOW (ref 3.5–5.0)
Alkaline Phosphatase: 149 U/L — ABNORMAL HIGH (ref 38–126)
Anion gap: 7 (ref 5–15)
BUN: 34 mg/dL — ABNORMAL HIGH (ref 6–20)
CO2: 28 mmol/L (ref 22–32)
Calcium: 8.2 mg/dL — ABNORMAL LOW (ref 8.9–10.3)
Chloride: 97 mmol/L — ABNORMAL LOW (ref 98–111)
Creatinine, Ser: 0.98 mg/dL (ref 0.61–1.24)
GFR, Estimated: >60 mL/min (ref >60)
Glucose, Bld: 135 mg/dL — ABNORMAL HIGH (ref 70–99)
Potassium: 4.3 mmol/L (ref 3.5–5.1)
Sodium: 132 mmol/L — ABNORMAL LOW (ref 135–145)
Total Bilirubin: 1.1 mg/dL (ref 0.3–1.2)
Total Protein: 6.3 g/dL — ABNORMAL LOW (ref 6.5–8.1)

## 2022-09-14 LAB — HEPARIN LEVEL (UNFRACTIONATED): Heparin Unfractionated: <0.1 [IU]/mL — ABNORMAL LOW (ref 0.30–0.70)

## 2022-09-14 LAB — CBC
HCT: 28.7 % — ABNORMAL LOW (ref 39.0–52.0)
Hemoglobin: 8.7 g/dL — ABNORMAL LOW (ref 13.0–17.0)
MCH: 29.7 pg (ref 26.0–34.0)
MCHC: 30.3 g/dL (ref 30.0–36.0)
MCV: 98 fL (ref 80.0–100.0)
Platelets: 539 10*3/uL — ABNORMAL HIGH (ref 150–400)
RBC: 2.93 MIL/uL — ABNORMAL LOW (ref 4.22–5.81)
RDW: 17.1 % — ABNORMAL HIGH (ref 11.5–15.5)
WBC: 13.1 10*3/uL — ABNORMAL HIGH (ref 4.0–10.5)
nRBC: 0 % (ref 0.0–0.2)

## 2022-09-14 LAB — COOXEMETRY PANEL
Carboxyhemoglobin: 2.3 % — ABNORMAL HIGH (ref 0.5–1.5)
Methemoglobin: <0.7 % (ref 0.0–1.5)
O2 Saturation: 65.4 %
Total hemoglobin: 9.1 g/dL — ABNORMAL LOW (ref 12.0–16.0)

## 2022-09-14 LAB — PROTIME-INR
INR: 1.5 — ABNORMAL HIGH (ref 0.8–1.2)
Prothrombin Time: 18.5 s — ABNORMAL HIGH (ref 11.4–15.2)

## 2022-09-14 LAB — LACTATE DEHYDROGENASE: LDH: 305 U/L — ABNORMAL HIGH (ref 98–192)

## 2022-09-14 LAB — MAGNESIUM: Magnesium: 2.2 mg/dL (ref 1.7–2.4)

## 2022-09-14 MED ORDER — WARFARIN SODIUM 5 MG PO TABS
7.5000 mg | ORAL_TABLET | Freq: Once | ORAL | Status: AC
  Administered 2022-09-14: 7.5 mg via ORAL
  Filled 2022-09-14: qty 1

## 2022-09-14 MED ORDER — FUROSEMIDE 10 MG/ML IJ SOLN
80.0000 mg | Freq: Two times a day (BID) | INTRAMUSCULAR | Status: AC
  Administered 2022-09-14 (×2): 80 mg via INTRAVENOUS
  Filled 2022-09-14 (×2): qty 8

## 2022-09-14 MED ORDER — MIDODRINE HCL 5 MG PO TABS
5.0000 mg | ORAL_TABLET | Freq: Three times a day (TID) | ORAL | Status: DC
  Administered 2022-09-14 – 2022-09-15 (×3): 5 mg via ORAL
  Filled 2022-09-14 (×3): qty 1

## 2022-09-14 MED ORDER — POTASSIUM CHLORIDE CRYS ER 20 MEQ PO TBCR
40.0000 meq | EXTENDED_RELEASE_TABLET | Freq: Once | ORAL | Status: AC
  Administered 2022-09-14: 40 meq via ORAL
  Filled 2022-09-14: qty 2

## 2022-09-14 NOTE — Progress Notes (Signed)
Inpatient Rehabilitation Admissions Coordinator   I met at bedside with patient , VAD coordinator, daughter and cousin. I encouraged patient to get up daily to improve his mobility and to work towards more rehab. He will need to improve his tolerance/endurance before pursuing a possible CIR admit. He is having difficulty with left hand to attempt power source change per VAD coordinator. I will continue to follow and encourage.  Ottie Glazier, RN, MSN Rehab Admissions Coordinator [phone] 09/14/2022 11:50 AM

## 2022-09-14 NOTE — Plan of Care (Signed)
  Problem: Education: Goal: Understanding of CV disease, CV risk reduction, and recovery process will improve Outcome: Progressing Goal: Individualized Educational Video(s) Outcome: Progressing   Problem: Activity: Goal: Ability to return to baseline activity level will improve Outcome: Progressing   Problem: Cardiovascular: Goal: Ability to achieve and maintain adequate cardiovascular perfusion will improve Outcome: Progressing Goal: Vascular access site(s) Level 0-1 will be maintained Outcome: Progressing   Problem: Health Behavior/Discharge Planning: Goal: Ability to safely manage health-related needs after discharge will improve Outcome: Progressing   Problem: Education: Goal: Knowledge of General Education information will improve Description: Including pain rating scale, medication(s)/side effects and non-pharmacologic comfort measures Outcome: Progressing   Problem: Health Behavior/Discharge Planning: Goal: Ability to manage health-related needs will improve Outcome: Progressing   Problem: Clinical Measurements: Goal: Ability to maintain clinical measurements within normal limits will improve Outcome: Progressing Goal: Will remain free from infection Outcome: Progressing Goal: Diagnostic test results will improve Outcome: Progressing Goal: Respiratory complications will improve Outcome: Progressing Goal: Cardiovascular complication will be avoided Outcome: Progressing   Problem: Activity: Goal: Risk for activity intolerance will decrease Outcome: Progressing   Problem: Nutrition: Goal: Adequate nutrition will be maintained Outcome: Progressing   Problem: Coping: Goal: Level of anxiety will decrease Outcome: Progressing   Problem: Elimination: Goal: Will not experience complications related to urinary retention Outcome: Progressing   Problem: Pain Managment: Goal: General experience of comfort will improve Outcome: Progressing   Problem:  Safety: Goal: Ability to remain free from injury will improve Outcome: Progressing   Problem: Skin Integrity: Goal: Risk for impaired skin integrity will decrease Outcome: Progressing

## 2022-09-14 NOTE — Progress Notes (Signed)
This chaplain is present with the Pt. for F/U spiritual care. The chaplain received an update from RN-Brittany before the visit.  The chaplain understands from the RN the Pt. is moving towards Wednesday's meeting goal of "moving the Pt. to the center of his care."   OT is providing care at the time of the visit. The chaplain viewed the Pt. actively engaging with OT.  The chaplain communicated with the Pt. and made plans to F/U on Friday.  Chaplain Stephanie Acre [phone]

## 2022-09-14 NOTE — Progress Notes (Addendum)
Patient ID: Charles Holmes, male   DOB: 08-30-1965, 57 y.o.   MRN: [id number]     Advanced Heart Failure Rounding Note  PCP-Cardiologist: Norman Herrlich, MD  [facility]: Dr. Gala Romney   Subjective:    7/25: Admit with cardiogenic shock. Started milrinone and NE. 8/6 S/P 13 teeth extractions  8/9 IABP placed 8/13 S/p HM III LVAD implant + clipping left atrial appendage d/t severe thickening and invagination of mitral valve annulus impeding flows. Apical core pathology consistent with nonischemic cardiomyopathy, did not confirm cardiac sarcoid 8/14 Left-sided hemiplegia. CT head with acute R MCA infarct. IR for thrombectomy. CT head with small to moderate area of hemorrhagic conversion 8/15 extubated  8/17 Low dose heparin gtt restarted, CT head with decreased size of hemorrhagic stroke.  8/18 Neurology signed off 8/19: Limited echo: Mod RV dysfunction, better mitral inflow into the LV and VAD position satisfactory. 8/26: Echo showed moderate-severe RV dysfunction but RV not enlarged.  Interventricular septum does bow towards towards the right. Speed increased to 5400 rpm, flow rose to 4.9 L/min.  Interventricular septum remained stable bowing slightly towards the right.    8/27: RHC with RA mean 5, PA 30/7, mean PCWP 4, CI 3.49, PAPi 4.6  Post Op Day #15  Back on Epi at 2.5, milrinone down to 0.125. CO-OX 65%.   CVP 12-13. ~ net even with 80 mg IV lasix yesterday.  INR 1.5   MAPs mostly 70s-80s  States he is too tired to get out of bed.    LVAD Interrogation HM III: Speed: 5400 Flow: 4.7  PI: 2.7 Power: 4. No PI events  Objective:   Weight Range: 76.2 kg Body mass index is 26.31 kg/m.   Vital Signs:   Temp:  [98.9 F (37.2 C)-99.7 F (37.6 C)] 99.5 F (37.5 C) (08/29 0430) Pulse Rate:  [49-214] 214 (08/29 0515) Resp:  [15-30] 24 (08/29 0615) BP: (71-119)/(37-90) 86/60 (08/29 0615) SpO2:  [81 %-98 %] 93 % (08/29 0515) Weight:  [76.2 kg] 76.2 kg (08/29 0450) Last BM Date :  09/13/22  Weight change: Filed Weights   09/12/22 0500 09/13/22 0452 09/14/22 0450  Weight: 77.4 kg 78.6 kg 76.2 kg    Intake/Output:   Intake/Output Summary (Last 24 hours) at 09/14/2022 0702 Last data filed at 09/14/2022 0600 Gross per 24 hour  Intake 2222.68 ml  Output 2176 ml  Net 46.68 ml   Physical Exam   Physical Exam: GENERAL: Fatigued appearing. HEENT: normal  NECK: Supple, JVP 12-14 .  2+ bilaterally, no bruits.   CARDIAC:  Mechanical heart sounds with LVAD hum present.  LUNGS:  Clear to auscultation bilaterally.  ABDOMEN:  Soft, round, nontender, positive bowel sounds x4.     LVAD exit site: Dressing dry and intact.  Stabilization device present and accurately applied.  EXTREMITIES:  Warm and dry, no cyanosis, clubbing, rash or edema  NEUROLOGIC:  Alert and oriented x 4. Affect \\flat .   Telemetry     ST 100s   Labs    CBC Recent Labs    09/13/22 0351 09/14/22 0426  WBC 13.2* 13.1*  HGB 8.4* 8.7*  HCT 28.3* 28.7*  MCV 98.6 98.0  PLT 517* 539*    Basic Metabolic Panel Recent Labs    40/98/11 0351 09/14/22 0426  NA 133* 132*  K 3.8 4.3  CL 94* 97*  CO2 29 28  GLUCOSE 167* 135*  BUN 37* 34*  CREATININE 0.90 0.98  CALCIUM 8.3* 8.2*  MG 2.2 2.2  BNP: BNP (last 3 results) Recent Labs    08/30/22 0258 09/04/22 2144 09/11/22 2252  BNP 922.3* 1,050.6* 933.0*     Imaging    No results found.   Medications:     Scheduled Medications:  sodium chloride   Intravenous Once   aspirin  324 mg Oral Daily   atorvastatin  80 mg Oral Daily   Chlorhexidine Gluconate Cloth  6 each Topical Q0600   digoxin  0.125 mg Oral Daily   dorzolamide-timolol  1 drop Both Eyes BID   feeding supplement  237 mL Oral TID BM   feeding supplement (PROSource TF20)  60 mL Per Tube BID   feeding supplement (VITAL 1.5 CAL)  780 mL Per Tube Q24H   fiber supplement (BANATROL TF)  60 mL Per Tube TID   Gerhardt's butt cream   Topical TID   insulin aspart   0-24 Units Subcutaneous TID WC & HS   latanoprost  1 drop Both Eyes QHS   melatonin  3 mg Oral QHS   mexiletine  250 mg Oral BID   mirtazapine  7.5 mg Oral QHS   mometasone-formoterol  2 puff Inhalation BID   multivitamin with minerals  1 tablet Oral Daily   mouth rinse  15 mL Mouth Rinse 4 times per day   pantoprazole (PROTONIX) IV  40 mg Intravenous QHS   QUEtiapine  25 mg Oral QHS   sildenafil  20 mg Oral TID   sodium chloride flush  3 mL Intravenous Q12H   umeclidinium bromide  1 puff Inhalation Daily   Warfarin - Pharmacist Dosing Inpatient   Does not apply q1600    Infusions:  sodium chloride Stopped (09/13/22 2035)   epinephrine 2.5 mcg/min (09/14/22 0600)   heparin 500 Units/hr (09/14/22 0600)   milrinone 0.125 mcg/kg/min (09/14/22 0600)    PRN Medications: sodium chloride, acetaminophen, albuterol, dextrose, ondansetron (ZOFRAN) IV, mouth rinse, oxyCODONE, polyethylene glycol, sodium chloride, sodium chloride flush, traMADol   Patient Profile  Charles Holmes is a 57 y.o. 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 Admitted with cardiogenic shock, stabilized and underwent HM3 LVAD. Post implant course c/b acute CVA.   Assessment/Plan  1.  Acute on chronic Systolic HF-->Cardiogenic Shock  - Diagnosed 11/2019. Presented with VT. LHC 70% LAD  - cMRI 12/21 concerning for sarcoid and EF 18%.  - PET 2/22 at [facility] EF 25% + active sarcoid - Echo 08/26/20 EF < 20% severely dilated LV RV mildly decreased.  - [employer] CRT-D upgrade in 06/08/21 - Echo 07/10/22: EF <20%, RV okay, mod pericardial effusion, mod Charles/TR - Admitted 07/25 with cardiogenic shock. - RHC: Nonobstructive CAD, severely elevated filling pressures and low Fick CO/CI (2.7/1.4) - 08/13 HM III LVAD implant + clipping LAA d/t severe thickening and invagination of mitral valve annulus impeding flows.  - Apical core sent - focal myocyte hypertrophy, myocyte  loss and chronic inflammation,  consistent with nonischemic cardiomyopathy  - Speed increased to 5300 on 08/14.  - Echo 8/26 mod-sev RV dysfunction. Speed increased to 5400 8/26 - RHC 8/27: low filing pressures with excellent cardiac output on EPI, PAPi 4.6 - Back on epi at 2.5, difficulty weaning off. Milrinone at 0.125. CO-OX stable. Stop milrinone then hopefully can wean off epi. - CVP 12-13. Increase lasix to 80 IV BID.  - INR 1.5. Continue ASA 325, heparin gtt and warfarin. Has been instructed to cut back ensure intake. Drinking  4-5/day >> has cut back to 3/day  2.  Acute stroke - Hx CVA 06/24 -Admitted 06/24 w/ R MCA stroke. S/p TPA and mechanical clot extraction. No residual deficits. Likely cardioembolic in setting of severe LV dysfunction. - Developed left sided weakness 08/14. CTA with R MCA infarct. Taken to IR for thrombectomy - Repeat CT head with small to moderate size hemorrhagic conversion. CT head this am stable. - D/w neuro, repeat head CT on 8/17 w/ improved hemorrhagic CVA, now back on low dose heparin per neurology.  - Back on warfarin - neurology signed off 09/03/22.  - C/w PT/OT.  - Continue ASA 325  - Has Cor-Trak and still getting nocturnal tube feeds, intake improving. - will need CIR once medically stable for d/c (team following)   3. 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 Continue mexiletine  - now s/p ICD.   4. CAD - LHC 12/07/19 70-% LAD, no intervention - LHC 8/24 non obstructive CAD.  - Continue statin. On aspirin for VAD.  5. Cardiac sarcoid - PET 2/22 at [facility] EF 25% + active sarcoid - Has completed prednisone.  - holding methotrexate w/ recent surgery  - apical core pathology not diagnostic of cardiac sarcoidosis.   6. 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 AKI - suspect cardiorenal, improved w/ inotropic support - Creatinine stable.  - follow BMP w/ diuresis    8. Iron deficiency anemia/ Post-op  anemia - recent T sat 15%, scheduled for OP feraheme. Will complete inpatient  - Transfused 1 u RBCs 8/15 - Hgb 8.7 today.    9. Pulmonary  - PFTs with severe obstructive defect, response to bronchodilator. FEV1 1.04L, FEV1/FVC 48% - extubated 8/15  - Cultures NGTD.   10. Elevated LFTs - AST/ALT stably elevated, normal bilirubin.   11. ID - WBC ct 22.5 => 18>16.5>13 - fever resolved - completed Merrem/vanc  - repeat BC X2 08/28 pending - UA not suggestive of UTI - Incentive spirometer and flutter valve at bedside, encouraged to use   Appears depressed. Not sleeping well. Low dose Remeron added yesterday. Encouraged him to get out of bed. Needs aggressive PT/OT.  Length of Stay: [address] LINDSAY N, PA-C  09/14/2022, 7:02 AM  VAD Team Pager [phone] (7am - 7am)   Advanced Heart Failure Team Pager [phone] (M-F; 7a - 5p)  Please contact [facility] for night-coverage after hours (5p -7a ) and weekends on [email or web address]  Patient seen with PA, agree with the above note.   I/Os even with Lasix 80 mg IV yesterday.  BP low at some point last night and he was started back on epinephrine 2.5.  He also remains on milrinone 0.125. MAP 70s this morning.  Co-ox 65%.    Patient was out of bed to chair yesterday, currently getting dysphagia 2 diet and tube feeds.   General: Well appearing this am. NAD.  HEENT: Normal. Neck: Supple, JVP 12 cm. Carotids OK.  Cardiac:  Mechanical heart sounds with LVAD hum present.  Lungs:  CTAB, normal effort.  Abdomen:  NT, ND, no HSM. No bruits or masses. +BS  LVAD exit site: Well-healed and incorporated. Dressing dry and intact. No erythema or drainage. Stabilization device present and accurately applied. Driveline dressing changed daily per sterile technique. Extremities:  Warm and dry. No cyanosis, clubbing, rash, or edema.  Neuro:  Alert & oriented x 3. Cranial nerves grossly intact. Moves all 4 extremities  w/o difficulty. Affect flat  Stop  milrinone today (co-ox 65%) and will work on tapering back off low dose epinephrine.  LVAD parameters stable, continue current speed.     CVP 16 today.  Will give Lasix 80 mg IV bid today.    He is now afebrile.  Blood, urine, and sputum cultures sent yesterday. Follow closely.  Driveline site looks ok.    INR 1.5, remains on heparin gtt/warfarin.    Depressed, Remeron added.   Work with PT.   CRITICAL CARE Performed by: Marca Ancona  Total critical care time: 40 minutes  Critical care time was exclusive of separately billable procedures and treating other patients.  Critical care was necessary to treat or prevent imminent or life-threatening deterioration.  Critical care was time spent personally by me on the following activities: development of treatment plan with patient and/or surrogate as well as nursing, discussions with consultants, evaluation of patient's response to treatment, examination of patient, obtaining history from patient or surrogate, ordering and performing treatments and interventions, ordering and review of laboratory studies, ordering and review of radiographic studies, pulse oximetry and re-evaluation of patient's condition.  Marca Ancona 09/14/2022 7:40 AM

## 2022-09-14 NOTE — Progress Notes (Signed)
HeartMate 3 Rounding Note  POD #16 Implant HM-3  Subjective:   57 year old male with cardiac sarcoidosis, nonischemic cardiomyopathy, and advanced heart failure refractory to optimal medical therapy underwent HeartMate 3 implantation on August 13 and required preoperative support with intra-aortic balloon pump and inotropic therapy.  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 Off anticoagulation initially due to mild hemorrhagic extension by f/u CT.  Patient was started on IV antibiotics postop day 6 for elevated white count and fever.  Cultures are negative.  Chest x-ray shows only mild atelectasis no significant effusion.  Sternal incision looks clearing and dry.  WBC has normalized   PharmD is slowly loading coumadin with low dose heparin bridge.  INR currently 1.5, coumadin dose 5 mg daily  Right heart cath yesterday shows low filling pressures with good cardiac output.  Weaning off epinephrine, milrinone  Patient appears depressed and intermittently he is declining PT mobilization.  Will start low-dose Remeron.  LVAD INTERROGATION:  HeartMate II LVAD:  Flow 4.3  liters/min, speed 5400 rpm, power 3.6, PI 4.1.  Controller intact.   Objective:    Vital Signs:   Temp:  [98.7 F (37.1 C)-99.7 F (37.6 C)] 98.7 F (37.1 C) (08/29 1110) Pulse Rate:  [49-214] 102 (08/29 0930) Resp:  [15-30] 20 (08/29 1130) BP: (71-119)/(50-92) 75/62 (08/29 1115) SpO2:  [75 %-100 %] 100 % (08/29 0930) Weight:  [76.2 kg] 76.2 kg (08/29 0450) Last BM Date : 09/14/22 Mean arterial Pressure 88-92 mm Hg  Intake/Output:  Exam  General -alert moving L  side better, speech normal, drinking Ensure and tolerating mechanical soft solid diet HEENT:-No bleeding from mouth at extraction sits Neck: supple. JVP . Carotids without bruits. No lymphadenopathy or thryomegaly appreciated. Cor: Mechanical heart sounds with LVAD hum present. Lungs: clear Abdomen: soft, nontender, nondistended. No hepatosplenomegaly. No bruits or masses. Good bowel sounds. Extremities: no cyanosis, clubbing, rash, edema Neuro: alert & orientedx3, cranial nerves grossly intact. moves all 4 extremities .Improving L arm,leg motor function.  Telemetry:paced with PPM 90/min  Labs: Basic Metabolic Panel: Recent Labs  Lab 09/11/22 0500 09/11/22 2122 09/12/22 0353 09/12/22 1108 09/13/22 0351 09/14/22 0426  NA 133* 134* 133* 140  138 133* 132*  K 3.6 3.3* 4.1 3.3*  3.5 3.8 4.3  CL 96* 92* 94*  --  94* 97*  CO2 29 30 33*  --  29 28  GLUCOSE 157* 147* 137*  --  167* 135*  BUN 33* 33* 36*  --  37* 34*  CREATININE 0.75 0.68 0.79  --  0.90 0.98  CALCIUM 8.1* 8.1* 8.2*  --  8.3* 8.2*  MG 2.2 1.9 2.1  --  2.2 2.2    Liver Function Tests: Recent Labs  Lab 09/10/22 0346 09/11/22 0500 09/12/22 0353 09/13/22 0351 09/14/22 0426  AST 146* 143* 117* 119* 92*  ALT 138* 150* 138* 138* 127*  ALKPHOS 159* 140* 131* 146* 149*  BILITOT 0.9 1.1 1.0 0.8 1.1  PROT 5.9* 6.1* 6.2* 6.1* 6.3*  ALBUMIN 2.0* 2.3* 2.5* 2.6* 2.5*   No results for input(s): "LIPASE", "AMYLASE" in the last 168 hours. No results for input(s): "AMMONIA" in the last 168 hours.  CBC: Recent  Labs  Lab 09/10/22 0346 09/11/22 0500 09/12/22 0353 09/12/22 1108 09/13/22 0351 09/14/22 0426  WBC 22.5* 18.1* 16.5*  --  13.2* 13.1*  HGB 8.7* 8.6* 8.5* 9.9*  10.2* 8.4* 8.7*  HCT 28.1* 28.1* 27.9* 29.0*  30.0* 28.3* 28.7*  MCV 100.4* 97.6 98.2  --  98.6 98.0  PLT 471* 488* 383  --  517* 539*    INR: Recent Labs  Lab 09/10/22 0346 09/11/22 0500 09/12/22 0353 09/13/22 0351 09/14/22 0426  INR 1.4*  1.3* 1.4* 1.5* 1.5*    Other results: EKG:   Imaging: DG Chest Port 1 View  Result Date: 09/14/2022 CLINICAL DATA:  LVAD EXAM: PORTABLE CHEST 1 VIEW COMPARISON:  09/12/2022 FINDINGS: Left AICD and LVAD remain in place, unchanged. Right PICC line is stable. Feeding tube is seen entering the stomach, stable. Distal aspect of the feeding tube not visualized on this chest x-ray. Prior median sternotomy. Mild cardiomegaly, vascular congestion. Left lower lobe atelectasis or infiltrate. Minimal right base atelectasis. No pneumothorax. IMPRESSION: Support devices stable. Bibasilar atelectasis or infiltrates, left greater than right. Electronically Signed   By: Charlett Nose M.D.   On: 09/14/2022 09:55     Medications:     Scheduled Medications:  sodium chloride   Intravenous Once   aspirin  324 mg Oral Daily   atorvastatin  80 mg Oral Daily   Chlorhexidine Gluconate Cloth  6 each Topical Q0600   digoxin  0.125 mg Oral Daily   dorzolamide-timolol  1 drop Both Eyes BID   feeding supplement  237 mL Oral TID BM   feeding supplement (PROSource TF20)  60 mL Per Tube BID   feeding supplement (VITAL 1.5 CAL)  780 mL Per Tube Q24H   fiber supplement (BANATROL TF)  60 mL Per Tube TID   furosemide  80 mg Intravenous BID   Gerhardt's butt cream   Topical TID   insulin aspart  0-24 Units Subcutaneous TID WC & HS   latanoprost  1 drop Both Eyes QHS   melatonin  3 mg Oral QHS   mexiletine  250 mg Oral BID   mirtazapine  7.5 mg Oral QHS   mometasone-formoterol  2 puff Inhalation BID   multivitamin with minerals  1 tablet Oral Daily   mouth rinse  15 mL Mouth Rinse 4 times per day   pantoprazole (PROTONIX) IV  40 mg Intravenous QHS   sildenafil  20 mg Oral TID   sodium chloride flush  3 mL Intravenous Q12H   umeclidinium bromide  1 puff Inhalation Daily   warfarin  7.5 mg Oral ONCE-1600   Warfarin - Pharmacist Dosing Inpatient   Does not apply q1600    Infusions:  sodium chloride Stopped  (09/13/22 2035)   epinephrine 2.5 mcg/min (09/14/22 1000)   heparin 500 Units/hr (09/14/22 1000)    PRN Medications: sodium chloride, acetaminophen, albuterol, dextrose, ondansetron (ZOFRAN) IV, mouth rinse, oxyCODONE, polyethylene glycol, sodium chloride, sodium chloride flush, traMADol   Assessment:  57 year old patient with sarcoidosis, COPD history of stroke  and nonischemic cardiomyopathy with preoperative cardiogenic shock requiring balloon pump and inotropic therapy.  Postoperative  right middle cerebral artery CVA treated with neuro-- radiology invention,   Postop RV dysfunction req inhaled NO and inotropes with incresad bilirubin 1.2>> 4.9 Now off inhaled NO and bilirubin normalized. Patient has been diuresed down to preop weight. Recent postop echo and right heart cath show excellent hemodynamics and adequate LV filling-weaning inotropes to support RV. Plan/Discussion:   RV dysfunction  status post VAD implant-improving by 2D echo and right heart cath data  Left side weakness from recurrent CVA. Working with physical therapy, ultimate goal will be CIR. Tube feeds at night, encourage PO intake- swallow function ok after CVA Depression post VAD implant-will start on low-dose Remeron.   Remove EPWs when off iv heparin, INR reasonable.  Stop IV heparin when INR is close to 2.0  I reviewed the LVAD parameters from today, and compared the results to the patient's prior recorded data.  No programming changes were made.  The LVAD is functioning within specified parameters.  The patient performs LVAD self-test daily.  LVAD interrogation was negative for any significant power changes, alarms or PI events/speed drops.  LVAD equipment check completed and is in good working order.  Back-up equipment present.   LVAD education done on emergency procedures and precautions and reviewed exit site care.  Length of Stay: [address]  Lovett Sox 09/14/2022, 12:19 PM

## 2022-09-14 NOTE — Progress Notes (Signed)
Patient ID: [name], male   DOB: [birth year], 57 y.o.   MRN: [id number]    Progress Note from the Palliative Medicine Team at [facility]   Patient Name: Charles Holmes        Date: 09/14/2022 DOB: Sep 24, 1965  Age: 57 y.o. MRN#: [id number] Attending Physician: Dolores Patty, MD Primary Care Physician: [name], PA-C Admit Date: 08/10/2022    Extensive chart review has been completed prior to meeting with patient  including labs, vital signs, imaging, progress/consult notes, orders, medications and available advance directive documents.   57 y.o. 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  Full assessment and workup; status post LVAD implant on 08-29-2022.  Postop day 1 code stroke called, CT significant for acute right MCA infarct, status post thrombectomy.   This NP assessed patient at the bedside as a follow up for palliative medicine needs and emotional support, daughter/Kayla and cousin /Roberta at bedside.  Emotional support and encouragement for his ongoing progress.   All recognized it has been a difficult journey.  Patient speaks to more hurdles than he ever imagined.  We talked about Kayla's pregnancy and expected birth of her baby in about three weeks.    It is hard but [name] continues to push himself,  knowing that what it will take for continued progression.  All remain hopeful for improvement and ultimately independence.    PMT will continue to support patient and family holistically.  Education offered today regarding  the importance of continued conversation within  family/support system and the medical providers regarding overall plan of care and treatment options,  ensuring decisions are within the context of the patients values and GOCs.  Questions and concerns addressed     Time: 25 minutes  Lorinda Creed NP  Palliative Medicine Team Team Phone # [phone] Pager [phone]

## 2022-09-14 NOTE — Progress Notes (Signed)
Unable to titrate off epi this afternoon. MAPs down to low 60s on 2.5 of epi. Increased back to 3.5.  Diuresing well with IV lasix.   No low flows or PI events on VAD.  Discussed with Dr. Shirlee Latch. Will add midodrine 5 TID to facilitate epi wean.

## 2022-09-14 NOTE — Progress Notes (Signed)
LVAD Coordinator Rounding Note:  Admitted 08/10/22 due to acute on chronic CHF with cardiogenic shock. Milrinone dependent. Advance therapy workup completed, and pt deemed acceptable VAD candidate. Dental extractions completed 8/6. IABP placed 08/25/22.  HM 3 LVAD implanted on 08/29/22 by Dr Donata Clay under destination therapy criteria. 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 Result negative.  7/25 Admit with cardiogenic shock. Started milrinone and NE. 8/6 S/P 13 teeth extractions  8/9 IABP placed 8/13 S/p HM III LVAD implant + clipping left atrial appendage d/t severe thickening and invagination of mitral valve annulus impeding flows  CT Head 8/14 (initial) 1. 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 ASPECTS is 7. 2. No acute hemorrhage.  CT Angio Head/Neck 1. 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 2. Core infarct of 12 cc (somewhat underestimated compared to aspects) with 90 cc of penumbra. 3. Mild atherosclerosis.  Pt taken emergently to IR for percutaneous right common carotid arteriogram with thrombectomy. Revascularization achieved. Angio-seal closure device applied to left groin- clean, dry, and intact.   CT Head 8/15 @ 0450 Unchanged extent of infarct and hemorrhage in the right MCA distribution including small volume intraventricular clot. No hydrocephalus.  CT Head 8/17 @ 0701 Interval evolution of the right MCA territory infarct with decreased intraparenchymal and intraventricular hemorrhage. No hydrocephalus or midline shift.   Pt lying in bed on my arrival. Denies complaints. RHC completed 8/27. Pt failed Epi termination. Epi was turned back on yesterday at 1030am. Currently at 2.5 mcg/min Milrinone at 0.125 mcg/kg/min. Pacing wires in place- to remain until Heparin drip stopped per Dr Donata Clay.   Pt with low grade fever. Blood cultures drawn yesterday.   Pt working with PT/OT/SLP. CIR consulted once  medically stable. Pt unable to sit in chair for longer than 45 minutes.   Dorathy Daft and Roberta arrived at bedside for d/c teachign today at 1000am. Attempted teaching with family and pt. Pt unable to stay awake to retain any information. VAD coordinator worked with pt on changing power sources. He is unable to do this due to his left sided weakness. I made a modification of his power cords in hopes he can grip the cords better. I also delivered practice power cords for use by pt. See below for education documentation.   Vital signs: Temp: 99.3 HR: 109 Doppler Pressure: 78 Automatic BP: 98/73 (82) O2 Sat: 100% on RA Wt: 183.6>191.1>190.9>190.2>184.8>178.8>178.1>172.4>170.6>173.2>167.9 lbs   LVAD interrogation reveals:  Speed: 5400 Flow: 4.8 Power: 3.9 w PI: 2.4  Alarms: none Events: none  Hematocrit: 28 Fixed speed: 5400 Low speed limit: 5100  Drive Line:  CDI. Continue Monday/ Wednesday/Friday dressing changes. Per Dr Donata Clay will transition to VASHE wet to dry dressing changes without silver strip with next dressing change. Next dressing change due 09/15/22 by VAD coordinator or nurse champion only.    Labs:  LDH trend: 596>441>165>384>345>312>312>350>327>309>316>305  INR trend: 1.4>1.6>1.1>1.1>1.3>1.2>1>1.3>1.4>1.5  AST/ALT trend: 218/45>96/30>73/23>63/36>58/37>69/50>93/72>123/103>143/150>117/138>119/138>92/127  Total Bili trend: 4.4>4.6>5.1>1.8>1.9>1.2>1.0>1.5>1.1>0.8  WBC trend: 11.6>13.7>19.6>16.1>23>26.6>21>24.6>18.1>16.5>13.2>13.1  Anticoagulation Plan: -INR Goal: 2.0 - 2.5 -ASA Dose: 325 mg  -Heparin 500 u/hr - Coumadin dosing per pharmacy  Blood Products:  IntraOp 8/14: - 4 FFP - 2 Platelets - 2 PRBC - 1 cyro - 449 cc of cellsaver - DDAVP 20 mcg x 1   Device: Medtronic BiV -  -Therapies: OFF  Arrythmias:   Respiratory: RA  Infection:  09/01/22>> sputum cx>> NGTD>>final 09/01/22>> blood cx>> NGTD>>final 09/01/22>> urine cx>> NGTD>>  final 09/13/22>>blood cxs>>pending  Renal:  8/22: BUN/CRT: 28/0.75  Adverse Events on VAD: - 08/30/22: - Developed left sided weakness this am. CTA with R MCA infarct. Taken to IR for thrombectomy   Drips:  Epinephrine 2.5 mcg/min Milrinone 0.125 mcg/kg/min  Patient Education: Pt attempted power source change. Left extremity too weak at this time to perform power change on his own. Coban placed on power leads for grip. Educated pt/family on controller, batteries, changing power sources, UBC and Roberta practiced donning and doffing sterile gloves. Family given d/c binder.  Plan/Recommendations:  Please page VAD coordinator for any alarms or VAD equipment issues. Continue MWF dressing changes by VAD coordinator or Nurse Alla Feeling.    Carlton Adam RN VAD Coordinator  Office: [phone]  24/7 Pager: [phone]

## 2022-09-14 NOTE — Progress Notes (Signed)
ANTICOAGULATION CONSULT NOTE  Pharmacy Consult for heparin + warfarin Indication:  LVAD  Allergies  Allergen Reactions   Amiodarone     Severe tremors   Percocet [Oxycodone-Acetaminophen] Itching    Patient Measurements: Height: 5\' 7"  (170.2 cm) Weight: 76.2 kg (167 lb 15.9 oz) IBW/kg (Calculated) : 66.1 Heparin Dosing Weight: 87kg  Vital Signs: Temp: 99.5 F (37.5 C) (08/29 0430) Temp Source: Oral (08/29 0430) BP: 87/65 (08/29 0700) Pulse Rate: 224 (08/29 0700)  Labs: Recent Labs    09/12/22 0353 09/12/22 0357 09/12/22 1108 09/13/22 0351 09/14/22 0426  HGB 8.5*  --  9.9*  10.2* 8.4* 8.7*  HCT 27.9*  --  29.0*  30.0* 28.3* 28.7*  PLT 383  --   --  517* 539*  LABPROT 16.9*  --   --  18.0* 18.5*  INR 1.4*  --   --  1.5* 1.5*  HEPARINUNFRC  --  <0.10*  --  <0.10* <0.10*  CREATININE 0.79  --   --  0.90 0.98    Estimated Creatinine Clearance: 75.9 mL/min (by C-G formula based on SCr of 0.98 mg/dL).   Medical History: Past Medical History:  Diagnosis Date   CAD (coronary artery disease)    CHF (congestive heart failure) (HCC)    GERD (gastroesophageal reflux disease)    Hyperlipidemia    Hypertension    Systolic heart failure (HCC) 2021   LVEF 18%, RVEF 38% on cardiac MRI 12/19/2019. possible cardiac sarcoidosis.   Wide-complex tachycardia 2021   wears LifeVest     Assessment: 57yoM on apixaban PTA for hx AF admitted for LVAD workup. Pt s/p HM3 implant on 8/13 c/b acute CVA postop. Head CT 8/17 stable, ok to start low dose heparin per neuro, warfarin started 8/19.  Heparin level <0.1 as expected, CBC and LDH stable, INR remains low at 1.5 (pt drinking lots of Ensures).   Goal of Therapy:  Heparin level <0.1 units/ml Monitor platelets by anticoagulation protocol: Yes   Plan:  Continue Heparin 500 units/h - no up titrations  Warfarin 7.5mg  x1 tonight Daily INR, heparin level, CBC  Fredonia Highland, PharmD, [place], [facility] Clinical  Pharmacist [phone] Please check AMION for all [facility] Pharm[facility] numbers 09/14/2022

## 2022-09-14 NOTE — Progress Notes (Signed)
Occupational Therapy Treatment Patient Details Name: Charles Holmes MRN: [id number] DOB: November 16, 1965 Today's Date: 09/14/2022   History of present illness Pt is 57 year old presented to [facility] on  08/10/22 for acute on chronic systolic heart failure. Pt had IABP placed on 08/25/22 for ongoing cardiogenic shock. Underwent placement of LVAD on 8/13. Developed lt sided weakness on 8/14. Rt MCA CVA. Underwent mechanical thrombectomy by IR. Extubated 8/15.  PMH - CAD, CHF, HLD, HTN, systolic HF, ICD implant, rt CVA.  LVAD placement and new stroke R MCA M1.   OT comments  Patient with good progress toward patient focused goals.  Patient able to participate with bedlevel ADL needing up to Max A.  Mod A for basic bed mobility with poor statis sitting balance.  Mod A for pivotal steps to recliner.  OT to continue efforts in the acute setting and Patient will benefit from intensive inpatient follow up therapy, >3 hours/day       If plan is discharge home, recommend the following:  Two people to help with walking and/or transfers;Help with stairs or ramp for entrance;Assistance with feeding;Assist for transportation;Assistance with cooking/housework;A lot of help with bathing/dressing/bathroom   Equipment Recommendations       Recommendations for Other Services      Precautions / Restrictions Precautions Precautions: Sternal;Fall Precaution Comments: LVAD Restrictions Weight Bearing Restrictions: Yes Other Position/Activity Restrictions: Sternal Precautions       Mobility Bed Mobility Overal bed mobility: Needs Assistance Bed Mobility: Supine to Sit     Supine to sit: Mod assist, +2 for safety/equipment          Transfers Overall transfer level: Needs assistance Equipment used: 1 person hand held assist Transfers: Sit to/from Stand, Bed to chair/wheelchair/BSC Sit to Stand: Mod assist     Step pivot transfers: Mod assist           Balance Overall balance assessment: Needs  assistance Sitting-balance support: Feet supported, Single extremity supported Sitting balance-Leahy Scale: Poor   Postural control: Left lateral lean, Posterior lean Standing balance support: Single extremity supported Standing balance-Leahy Scale: Poor                             ADL either performed or assessed with clinical judgement   ADL       Grooming: Set up;Bed level   Upper Body Bathing: Moderate assistance;Bed level   Lower Body Bathing: Maximal assistance;Bed level   Upper Body Dressing : Moderate assistance;Bed level   Lower Body Dressing: Maximal assistance;Bed level   Toilet Transfer: Moderate assistance;Stand-pivot   Toileting- Clothing Manipulation and Hygiene: Total assistance;Sit to/from stand;+2 for safety/equipment              Extremity/Trunk Assessment Upper Extremity Assessment Upper Extremity Assessment: LUE deficits/detail LUE Deficits / Details: edematous, ,able to raise arm ~90* with active assist, can flex /ext elbow to bring hand to mouth with elbow supported, wrist flex/ext in neutral and in pronated position, can composite flex/ext digits LUE Sensation: WNL LUE Coordination: decreased fine motor;decreased gross motor   Lower Extremity Assessment Lower Extremity Assessment: Defer to PT evaluation   Cervical / Trunk Assessment Cervical / Trunk Assessment: Kyphotic    Vision   Vision Assessment?: No apparent visual deficits   Perception Perception Perception: Not tested   Praxis Praxis Praxis: Not tested    Cognition Arousal: Alert Behavior During Therapy: WFL for tasks assessed/performed Overall Cognitive Status: Impaired/Different from baseline  Memory: Decreased recall of precautions Following Commands: Follows one step commands with increased time     Problem Solving: Slow processing, Requires verbal cues, Difficulty sequencing          Exercises      Shoulder  Instructions       General Comments      Pertinent Vitals/ Pain       Pain Assessment Pain Assessment: No/denies pain Pain Intervention(s): Monitored during session                                                          Frequency  Min 1X/week        Progress Toward Goals  OT Goals(current goals can now be found in the care plan section)  Progress towards OT goals: Progressing toward goals  Acute Rehab OT Goals OT Goal Formulation: With patient Time For Goal Achievement: 09/28/22 Potential to Achieve Goals: Fair ADL Goals Pt Will Transfer to Toilet: with min assist;stand pivot transfer;bedside commode  Plan      Co-evaluation                 AM-PAC OT "6 Clicks" Daily Activity     Outcome Measure   Help from another person eating meals?: A Little Help from another person taking care of personal grooming?: A Little Help from another person toileting, which includes using toliet, bedpan, or urinal?: A Lot Help from another person bathing (including washing, rinsing, drying)?: A Lot Help from another person to put on and taking off regular upper body clothing?: A Lot Help from another person to put on and taking off regular lower body clothing?: A Lot 6 Click Score: 14    End of Session Equipment Utilized During Treatment: Gait belt  OT Visit Diagnosis: Unsteadiness on feet (R26.81);Other abnormalities of gait and mobility (R26.89);Muscle weakness (generalized) (M62.81)   Activity Tolerance Patient tolerated treatment well   Patient Left in chair;with call bell/phone within reach;with chair alarm set   Nurse Communication Mobility status        Time: 7253-6644 OT Time Calculation (min): 28 min  Charges: OT General Charges $OT Visit: 1 Visit OT Treatments $Self Care/Home Management : 23-37 mins  09/14/2022  RP, OTR/L  Acute Rehabilitation Services  Office:  [phone]   Suzanna Obey 09/14/2022, 2:17 PM

## 2022-09-14 NOTE — Progress Notes (Signed)
Orthopedic Tech Progress Note Patient Details:  Charles Holmes [birth year] [id number]  Pt refusing unna boot application as of 8/28 per [place], [place].   Patient ID: Charles Holmes, male   DOB: 03/30/1965, 57 y.o.   MRN: [id number]  [name] 09/14/2022, 7:55 AM

## 2022-09-15 DIAGNOSIS — I428 Other cardiomyopathies: Secondary | ICD-10-CM | POA: Diagnosis not present

## 2022-09-15 DIAGNOSIS — I5023 Acute on chronic systolic (congestive) heart failure: Secondary | ICD-10-CM | POA: Diagnosis not present

## 2022-09-15 LAB — GLUCOSE, CAPILLARY
Glucose-Capillary: 109 mg/dL — ABNORMAL HIGH (ref 70–99)
Glucose-Capillary: 117 mg/dL — ABNORMAL HIGH (ref 70–99)
Glucose-Capillary: 126 mg/dL — ABNORMAL HIGH (ref 70–99)
Glucose-Capillary: 132 mg/dL — ABNORMAL HIGH (ref 70–99)
Glucose-Capillary: 157 mg/dL — ABNORMAL HIGH (ref 70–99)
Glucose-Capillary: 76 mg/dL (ref 70–99)

## 2022-09-15 LAB — COMPREHENSIVE METABOLIC PANEL
ALT: 123 U/L — ABNORMAL HIGH (ref 0–44)
AST: 87 U/L — ABNORMAL HIGH (ref 15–41)
Albumin: 2.5 g/dL — ABNORMAL LOW (ref 3.5–5.0)
Alkaline Phosphatase: 156 U/L — ABNORMAL HIGH (ref 38–126)
Anion gap: 11 (ref 5–15)
BUN: 39 mg/dL — ABNORMAL HIGH (ref 6–20)
CO2: 31 mmol/L (ref 22–32)
Calcium: 8.7 mg/dL — ABNORMAL LOW (ref 8.9–10.3)
Chloride: 95 mmol/L — ABNORMAL LOW (ref 98–111)
Creatinine, Ser: 0.69 mg/dL (ref 0.61–1.24)
GFR, Estimated: >60 mL/min (ref >60)
Glucose, Bld: 129 mg/dL — ABNORMAL HIGH (ref 70–99)
Potassium: 4.2 mmol/L (ref 3.5–5.1)
Sodium: 137 mmol/L (ref 135–145)
Total Bilirubin: 0.9 mg/dL (ref 0.3–1.2)
Total Protein: 6.6 g/dL (ref 6.5–8.1)

## 2022-09-15 LAB — LACTATE DEHYDROGENASE: LDH: 298 U/L — ABNORMAL HIGH (ref 98–192)

## 2022-09-15 LAB — COOXEMETRY PANEL
Carboxyhemoglobin: 2.3 % — ABNORMAL HIGH (ref 0.5–1.5)
Methemoglobin: <0.7 % (ref 0.0–1.5)
O2 Saturation: 68.3 %
Total hemoglobin: 9.3 g/dL — ABNORMAL LOW (ref 12.0–16.0)

## 2022-09-15 LAB — CBC
HCT: 29.2 % — ABNORMAL LOW (ref 39.0–52.0)
Hemoglobin: 8.8 g/dL — ABNORMAL LOW (ref 13.0–17.0)
MCH: 28.9 pg (ref 26.0–34.0)
MCHC: 30.1 g/dL (ref 30.0–36.0)
MCV: 96.1 fL (ref 80.0–100.0)
Platelets: 592 10*3/uL — ABNORMAL HIGH (ref 150–400)
RBC: 3.04 MIL/uL — ABNORMAL LOW (ref 4.22–5.81)
RDW: 17.3 % — ABNORMAL HIGH (ref 11.5–15.5)
WBC: 12.5 10*3/uL — ABNORMAL HIGH (ref 4.0–10.5)
nRBC: 0 % (ref 0.0–0.2)

## 2022-09-15 LAB — HEPARIN LEVEL (UNFRACTIONATED): Heparin Unfractionated: <0.1 [IU]/mL — ABNORMAL LOW (ref 0.30–0.70)

## 2022-09-15 LAB — MAGNESIUM: Magnesium: 2.3 mg/dL (ref 1.7–2.4)

## 2022-09-15 LAB — PROTIME-INR
INR: 1.6 — ABNORMAL HIGH (ref 0.8–1.2)
Prothrombin Time: 19.7 s — ABNORMAL HIGH (ref 11.4–15.2)

## 2022-09-15 MED ORDER — FUROSEMIDE 10 MG/ML IJ SOLN
80.0000 mg | Freq: Two times a day (BID) | INTRAMUSCULAR | Status: AC
  Administered 2022-09-15 (×2): 80 mg via INTRAVENOUS
  Filled 2022-09-15 (×2): qty 8

## 2022-09-15 MED ORDER — DRONABINOL 2.5 MG PO CAPS
2.5000 mg | ORAL_CAPSULE | Freq: Two times a day (BID) | ORAL | Status: DC
  Administered 2022-09-15 – 2022-09-27 (×25): 2.5 mg via ORAL
  Filled 2022-09-15 (×25): qty 1

## 2022-09-15 MED ORDER — WARFARIN SODIUM 3 MG PO TABS
6.0000 mg | ORAL_TABLET | Freq: Once | ORAL | Status: AC
  Administered 2022-09-15: 6 mg via ORAL
  Filled 2022-09-15: qty 2

## 2022-09-15 MED ORDER — POTASSIUM CHLORIDE CRYS ER 20 MEQ PO TBCR
40.0000 meq | EXTENDED_RELEASE_TABLET | Freq: Once | ORAL | Status: AC
  Administered 2022-09-15: 40 meq via ORAL
  Filled 2022-09-15: qty 2

## 2022-09-15 MED ORDER — MIDODRINE HCL 5 MG PO TABS
10.0000 mg | ORAL_TABLET | Freq: Three times a day (TID) | ORAL | Status: DC
  Administered 2022-09-15 – 2022-09-26 (×33): 10 mg via ORAL
  Filled 2022-09-15 (×33): qty 2

## 2022-09-15 NOTE — Progress Notes (Signed)
CSW met at bedside with patient who shares that "my body is worn out". Patient reported that he has requested that PT not come in the morning as "I need time to get up and moving". CSW encouraged patient to participate in therapy and provided supportive counseling as he appears to be depressed.  CSW confirmed patient started on medication for depression and discussed with provider. CSW continues to follow for supportive needs throughout hospitalization. Lasandra Beech, LCSW, CCSW-MCS [phone]

## 2022-09-15 NOTE — Progress Notes (Signed)
LVAD Coordinator Rounding Note:  Admitted 08/10/22 due to acute on chronic CHF with cardiogenic shock. Milrinone dependent. Advance therapy workup completed, and pt deemed acceptable VAD candidate. Dental extractions completed 8/6. IABP placed 08/25/22.  HM 3 LVAD implanted on 08/29/22 by Dr Donata Clay under destination therapy criteria. 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 Result negative.  7/25 Admit with cardiogenic shock. Started milrinone and NE. 8/6 S/P 13 teeth extractions  8/9 IABP placed 8/13 S/p HM III LVAD implant + clipping left atrial appendage d/t severe thickening and invagination of mitral valve annulus impeding flows  CT Head 8/14 (initial) 1. 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 ASPECTS is 7. 2. No acute hemorrhage.  CT Angio Head/Neck 1. 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 2. Core infarct of 12 cc (somewhat underestimated compared to aspects) with 90 cc of penumbra. 3. Mild atherosclerosis.  Pt taken emergently to IR for percutaneous right common carotid arteriogram with thrombectomy. Revascularization achieved. Angio-seal closure device applied to left groin- clean, dry, and intact.   CT Head 8/15 @ 0450 Unchanged extent of infarct and hemorrhage in the right MCA distribution including small volume intraventricular clot. No hydrocephalus.  CT Head 8/17 @ 0701 Interval evolution of the right MCA territory infarct with decreased intraparenchymal and intraventricular hemorrhage. No hydrocephalus or midline shift.   Pt lying in bed asleep on my arrival. Denies complaints. RHC completed 8/27. Pt failed Epi termination. Epi was turned back on yesterday at 1030am. Currently at 2.5 mcg/min Milrinone stopped. Pacing wires in place- to remain until Heparin drip stopped per Dr Donata Clay.   Pt with low grade fever. Blood cultures drawn 8/28.   Pt working with PT/OT/SLP. CIR consulted once medically  stable. Pt unable to sit in chair for longer than 45 minutes.   VAD coordinator/nurses working with pt on changing power sources. He is unable to do this due to his left sided weakness. I made a modification of his power cords in hopes he can grip the cords better. I also delivered practice power cords for use by pt. See below for education documentation.   Vital signs: Temp: 98.9 HR: 93 Doppler Pressure: not documentated Automatic BP: 96/83 (89) O2 Sat: 100% on RA Wt: 183.6>191.1>190.9>190.2>184.8>178.8>178.1>172.4>170.6>173.2>167.9>167.1 lbs   LVAD interrogation reveals:  Speed: 5400 Flow: 4.6 Power: 3.8w PI: 3.1  Alarms: none Events: none  Hematocrit: 29 Fixed speed: 5400 Low speed limit: 5100  Drive Line:  Existing VAD dressing removed and site care performed using sterile technique. Drive line exit site cleaned with Chlora prep applicators x 2, allowed to dry, and Vashe moistened 2x2 placed around driveline then covered with dry 4x4. Exit site healed and incorporated, the velour is fully implanted at exit site. Small amount of serous drainage.Slight redness, no tenderness, foul odor or rash noted. Distal suture removed today. Proximal suture remains. Drive line anchor re-applied. Pt denies fever or chills. Continue Monday/ Wednesday/Friday dressing changes.  Next dressing change due 09/18/22 by VAD coordinator or nurse champion only.       Labs:  LDH trend: 596>441>165>384>345>312>312>350>327>309>316>305>298  INR trend: 1.4>1.6>1.1>1.1>1.3>1.2>1>1.3>1.4>1.5>1.6  AST/ALT trend: 218/45>96/30>73/23>63/36>58/37>69/50>93/72>123/103>143/150>117/138>119/138>92/127>87/123  Total Bili trend: 4.4>4.6>5.1>1.8>1.9>1.2>1.0>1.5>1.1>0.8  WBC trend: 11.6>13.7>19.6>16.1>23>26.6>21>24.6>18.1>16.5>13.2>13.1>12.5  Anticoagulation Plan: -INR Goal: 2.0 - 2.5 -ASA Dose: 325 mg  -Heparin 500 u/hr - Coumadin dosing per pharmacy  Blood Products:  IntraOp 8/14: - 4 FFP - 2 Platelets - 2  PRBC - 1 cyro - 449 cc of cellsaver -  DDAVP 20 mcg x 1   Device: Medtronic BiV -  -Therapies: OFF  Arrythmias:   Respiratory: RA  Infection:  09/01/22>> sputum cx>> NGTD>>final 09/01/22>> blood cx>> NGTD>>final 09/01/22>> urine cx>> NGTD>> final 09/13/22>>blood cxs>>pending  Renal:  8/22: BUN/CRT: 28/0.75  Adverse Events on VAD: - 08/30/22: - Developed left sided weakness this am. CTA with R MCA infarct. Taken to IR for thrombectomy   Drips:  Epinephrine 2.5 mcg/min Milrinone 0.125 mcg/kg/min  Patient Education: Pt attempted power source change. Left extremity too weak at this time to perform power change on his own. Coban placed on power leads for grip. Educated pt/family on controller, batteries, changing power sources, UBC and Roberta practiced donning and doffing sterile gloves. Family given d/c binder.  Plan/Recommendations:  Please page VAD coordinator for any alarms or VAD equipment issues. Continue MWF dressing changes by VAD coordinator or Nurse Alla Feeling.    Carlton Adam RN VAD Coordinator  Office: [phone]  24/7 Pager: [phone]

## 2022-09-15 NOTE — Progress Notes (Signed)
ANTICOAGULATION CONSULT NOTE  Pharmacy Consult for heparin + warfarin Indication:  LVAD  Allergies  Allergen Reactions   Amiodarone     Severe tremors   Percocet [Oxycodone-Acetaminophen] Itching    Patient Measurements: Height: 5\' 7"  (170.2 cm) Weight: 75.8 kg (167 lb 1.7 oz) IBW/kg (Calculated) : 66.1 Heparin Dosing Weight: 87kg  Vital Signs: Temp: 98.9 F (37.2 C) (08/30 0633) Temp Source: Oral (08/30 4098) BP: 110/83 (08/30 0800) Pulse Rate: 150 (08/30 0800)  Labs: Recent Labs    09/13/22 0351 09/14/22 0426 09/15/22 0354  HGB 8.4* 8.7* 8.8*  HCT 28.3* 28.7* 29.2*  PLT 517* 539* 592*  LABPROT 18.0* 18.5* 19.7*  INR 1.5* 1.5* 1.6*  HEPARINUNFRC <0.10* <0.10* <0.10*  CREATININE 0.90 0.98 0.69    Estimated Creatinine Clearance: 93 mL/min (by C-G formula based on SCr of 0.69 mg/dL).   Medical History: Past Medical History:  Diagnosis Date   CAD (coronary artery disease)    CHF (congestive heart failure) (HCC)    GERD (gastroesophageal reflux disease)    Hyperlipidemia    Hypertension    Systolic heart failure (HCC) 2021   LVEF 18%, RVEF 38% on cardiac MRI 12/19/2019. possible cardiac sarcoidosis.   Wide-complex tachycardia 2021   wears LifeVest     Assessment: 57yoM on apixaban PTA for hx AF admitted for LVAD workup. Pt s/p HM3 implant on 8/13 c/b acute CVA postop. Head CT 8/17 stable, ok to start low dose heparin per neuro, warfarin started 8/19.  Heparin level <0.1 as expected, CBC and LDH stable, INR remains low at 1.6 trending up slowly (pt drinking lots of Ensures).   Goal of Therapy:  Heparin level <0.1 units/ml Monitor platelets by anticoagulation protocol: Yes   Plan:  Continue Heparin 500 units/h - no up titrations  Warfarin 6mg  x1 tonight Daily INR, heparin level, CBC  Fredonia Highland, PharmD, [place], [facility] Clinical Pharmacist [phone] Please check AMION for all [facility] Pharm[facility] numbers 09/15/2022

## 2022-09-15 NOTE — Progress Notes (Signed)
HeartMate 3 Rounding Note  POD #17 Implant HM-3  Subjective:   57 year old male with cardiac sarcoidosis, nonischemic cardiomyopathy, and advanced heart failure refractory to optimal medical therapy underwent HeartMate 3 implantation on August 13 and required preoperative support with intra-aortic balloon pump and inotropic therapy.  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 Off anticoagulation initially due to mild hemorrhagic extension by f/u CT.  Patient was started on IV antibiotics postop day 6 for elevated white count and fever.  Cultures are negative.  Chest x-ray shows only mild atelectasis no significant effusion.  Sternal incision looks clearing and dry.  WBC has normalized   PharmD is slowly loading coumadin with low dose heparin bridge.  INR currently 1.6 coumadin dose 5 mg daily  Right heart cath 8-27 shows low filling pressures with good cardiac output.  Weaning off epinephrine, milrinone  Patient appears depressed and intermittently he is declining PT mobilization.  Will start low-dose Remeron.Appetite enhancement with merinol.  LVAD INTERROGATION:  HeartMate II LVAD:  Flow 4.3  liters/min, speed 5400 rpm, power 3.6, PI 4.1.  Controller intact.   Objective:    Vital Signs:   Temp:  [97.5 F (36.4 C)-99.4 F (37.4 C)] 98.8 F (37.1 C) (08/30 1119) Pulse Rate:  [30-176] 31 (08/30 1200) Resp:  [14-29] 23 (08/30 1200) BP: (73-143)/(48-117) 87/69 (08/30 1100) SpO2:  [87 %-99 %] 94 % (08/30 1200) Weight:  [75.8 kg] 75.8 kg (08/30 0456) Last BM Date : 09/14/22 Mean arterial Pressure 88-92 mm  Hg  Intake/Output:  Exam  General -alert moving L side better, speech normal, drinking Ensure and tolerating mechanical soft solid diet HEENT:-No bleeding from mouth at extraction sits Neck: supple. JVP . Carotids without bruits. No lymphadenopathy or thryomegaly appreciated. Cor: Mechanical heart sounds with LVAD hum present. Lungs: clear Abdomen: soft, nontender, nondistended. No hepatosplenomegaly. No bruits or masses. Good bowel sounds. Extremities: no cyanosis, clubbing, rash, edema Neuro: alert & orientedx3, cranial nerves grossly intact. moves all 4 extremities .Improving L arm,leg motor function.  Telemetry:paced with PPM 90/min  Labs: Basic Metabolic Panel: Recent Labs  Lab 09/11/22 2122 09/12/22 0353 09/12/22 1108 09/13/22 0351 09/14/22 0426 09/15/22 0354  NA 134* 133* 140  138 133* 132* 137  K 3.3* 4.1 3.3*  3.5 3.8 4.3 4.2  CL 92* 94*  --  94* 97* 95*  CO2 30 33*  --  29 28 31   GLUCOSE 147* 137*  --  167* 135* 129*  BUN 33* 36*  --  37* 34* 39*  CREATININE 0.68 0.79  --  0.90 0.98 0.69  CALCIUM 8.1* 8.2*  --  8.3* 8.2* 8.7*  MG 1.9 2.1  --  2.2 2.2 2.3    Liver Function Tests: Recent Labs  Lab 09/11/22 0500 09/12/22 0353 09/13/22 0351 09/14/22 0426 09/15/22 0354  AST 143* 117* 119* 92* 87*  ALT 150* 138* 138* 127* 123*  ALKPHOS 140* 131* 146* 149* 156*  BILITOT 1.1 1.0 0.8 1.1 0.9  PROT 6.1* 6.2* 6.1* 6.3* 6.6  ALBUMIN 2.3* 2.5* 2.6* 2.5* 2.5*   No results for input(s): "LIPASE", "AMYLASE" in the last 168 hours. No results for input(s): "AMMONIA" in the last 168 hours.  CBC: Recent Labs  Lab 09/11/22 0500 09/12/22 0353 09/12/22 1108 09/13/22 0351 09/14/22 0426 09/15/22 0354  WBC 18.1* 16.5*  --  13.2* 13.1* 12.5*  HGB 8.6* 8.5* 9.9*  10.2* 8.4* 8.7* 8.8*  HCT 28.1* 27.9* 29.0*  30.0* 28.3* 28.7* 29.2*  MCV 97.6 98.2  --  98.6 98.0 96.1  PLT 488* 383  --  517* 539* 592*    INR: Recent Labs  Lab 09/11/22 0500 09/12/22 0353  09/13/22 0351 09/14/22 0426 09/15/22 0354  INR 1.3* 1.4* 1.5* 1.5* 1.6*    Other results: EKG:   Imaging: DG Chest Port 1 View  Result Date: 09/14/2022 CLINICAL DATA:  LVAD EXAM: PORTABLE CHEST 1 VIEW COMPARISON:  09/12/2022 FINDINGS: Left AICD and LVAD remain in place, unchanged. Right PICC line is stable. Feeding tube is seen entering the stomach, stable. Distal aspect of the feeding tube not visualized on this chest x-ray. Prior median sternotomy. Mild cardiomegaly, vascular congestion. Left lower lobe atelectasis or infiltrate. Minimal right base atelectasis. No pneumothorax. IMPRESSION: Support devices stable. Bibasilar atelectasis or infiltrates, left greater than right. Electronically Signed   By: Charlett Nose M.D.   On: 09/14/2022 09:55     Medications:     Scheduled Medications:  sodium chloride   Intravenous Once   aspirin  324 mg Oral Daily   atorvastatin  80 mg Oral Daily   Chlorhexidine Gluconate Cloth  6 each Topical Q0600   digoxin  0.125 mg Oral Daily   dorzolamide-timolol  1 drop Both Eyes BID   dronabinol  2.5 mg Oral BID AC   feeding supplement  237 mL Oral TID BM   feeding supplement (PROSource TF20)  60 mL Per Tube BID   feeding supplement (VITAL 1.5 CAL)  780 mL Per Tube Q24H   fiber supplement (BANATROL TF)  60 mL Per Tube TID   furosemide  80 mg Intravenous BID   Gerhardt's butt cream   Topical TID   insulin aspart  0-24 Units Subcutaneous TID WC & HS   latanoprost  1 drop Both Eyes QHS   melatonin  3 mg Oral QHS   mexiletine  250 mg Oral BID   midodrine  10 mg Oral Q8H   mirtazapine  7.5 mg Oral QHS   mometasone-formoterol  2 puff Inhalation BID   multivitamin with minerals  1 tablet Oral Daily   mouth rinse  15 mL Mouth Rinse 4 times per day   pantoprazole (PROTONIX) IV  40 mg Intravenous QHS   sildenafil  20 mg Oral TID   sodium chloride flush  3 mL Intravenous Q12H   umeclidinium bromide  1 puff Inhalation Daily   warfarin  6 mg Oral ONCE-1600    Warfarin - Pharmacist Dosing Inpatient   Does not apply q1600    Infusions:  sodium chloride Stopped (09/13/22 2035)   epinephrine 1 mcg/min (09/15/22 1200)   heparin 500 Units/hr (09/15/22 1200)    PRN Medications: sodium chloride, acetaminophen, albuterol, dextrose, ondansetron (ZOFRAN) IV, mouth rinse, oxyCODONE, polyethylene glycol, sodium chloride, sodium chloride flush, traMADol   Assessment:  57 year old patient with sarcoidosis, COPD history of stroke  and nonischemic cardiomyopathy with preoperative cardiogenic shock requiring balloon pump and inotropic therapy.  Postoperative  right middle cerebral artery CVA treated with neuro-- radiology invention,   Postop RV dysfunction req inhaled NO and inotropes with incresad bilirubin 1.2>> 4.9 Now off inhaled NO and bilirubin normalized. Patient has been diuresed down to preop weight. Recent postop echo and  right heart cath show excellent hemodynamics and adequate LV filling-weaning inotropes to support RV. Plan/Discussion:   RV dysfunction status post VAD implant-improving by 2D echo and right heart cath data  Left side weakness from recurrent CVA. Working with physical therapy, ultimate goal will be CIR. Tube feeds at night, encourage PO intake- swallow function ok after CVA Depression post VAD implant-will start on low-dose Remeron.   Remove EPWs when off iv heparin, INR reasonable.  Stop IV heparin when INR is close to 2.0  I reviewed the LVAD parameters from today, and compared the results to the patient's prior recorded data.  No programming changes were made.  The LVAD is functioning within specified parameters.  The patient performs LVAD self-test daily.  LVAD interrogation was negative for any significant power changes, alarms or PI events/speed drops.  LVAD equipment check completed and is in good working order.  Back-up equipment present.   LVAD education done on emergency procedures and precautions and reviewed exit  site care.  Length of Stay: [address]  Lovett Sox 09/15/2022, 12:40 PM

## 2022-09-15 NOTE — Progress Notes (Signed)
Physical Therapy Treatment Patient Details Name: Charles Holmes MRN: [id number] DOB: [birth year] Today's Date: 09/15/2022   History of Present Illness Pt is 57 year old presented to [facility] on  08/10/22 for acute on chronic systolic heart failure. Pt had IABP placed on 08/25/22 for ongoing cardiogenic shock. Underwent placement of LVAD on 8/13. Developed lt sided weakness on 8/14. Rt MCA CVA. Underwent mechanical thrombectomy by IR. Extubated 8/15.  PMH - CAD, CHF, HLD, HTN, systolic HF, ICD implant, rt CVA.  LVAD placement and new stroke R MCA M1.    PT Comments  Pt with limited participation today and refusing transfers or OOB. Pt stating therapy isn't supposed to come in the morning (it was 11:30). Assisted pt to sitting and when we prepared to work on transfers pt began lying straight back on bed and said he wasn't going to get up and that we couldn't make him do something he didn't want to do. Explained to pt that to get stronger he needed to get up OOB more and that for him to be considered for inpatient rehab he needed to be willing to work with therapy in the mornings and afternoons. Pt continued to refuse any further mobility. Pt's emotional state is limiting his activity and potential progress  Will continue to work toward getting pt to engage and be more active in therapy in order to progress.    If plan is discharge home, recommend the following:     Can travel by private vehicle        Equipment Recommendations  Other (comment) (To be determined)    Recommendations for Other Services       Precautions / Restrictions Precautions Precautions: Sternal;Fall Precaution Comments: LVAD Restrictions Other Position/Activity Restrictions: Sternal Precautions     Mobility  Bed Mobility Overal bed mobility: Needs Assistance Bed Mobility: Supine to Sit, Sit to Supine     Supine to sit: Mod assist Sit to supine: Mod assist   General bed mobility comments: Assist to bring BLE off of  bed, elevate trunk into sitting, and bring hips to EOB. Assist to bring legs back up into bed returning to supine    Transfers                   General transfer comment: Pt refusing any transfers    Ambulation/Gait                   Stairs             Wheelchair Mobility     Tilt Bed    Modified Rankin (Stroke Patients Only) Modified Rankin (Stroke Patients Only) Pre-Morbid Rankin Score: No symptoms Modified Rankin: Moderately severe disability     Balance Overall balance assessment: Needs assistance Sitting-balance support: Feet supported, Single extremity supported Sitting balance-Leahy Scale: Poor Sitting balance - Comments: UE support                                    Cognition Arousal: Lethargic, Alert Behavior During Therapy: Flat affect Overall Cognitive Status: Impaired/Different from baseline Area of Impairment: Safety/judgement, Problem solving                     Memory: Decreased recall of precautions       Problem Solving: Slow processing, Requires verbal cues General Comments: Very depressed affect.        Exercises  General Comments        Pertinent Vitals/Pain Pain Assessment Pain Assessment: No/denies pain    Home Living                          Prior Function            PT Goals (current goals can now be found in the care plan section) Acute Rehab PT Goals PT Goal Formulation: With patient Time For Goal Achievement: 09/29/22 Potential to Achieve Goals: Fair Progress towards PT goals: Not progressing toward goals - comment;Goals updated    Frequency    Min 1X/week      PT Plan Current plan remains appropriate    Co-evaluation              AM-PAC PT "6 Clicks" Mobility   Outcome Measure  Help needed turning from your back to your side while in a flat bed without using bedrails?: A Lot Help needed moving from lying on your back to sitting on the  side of a flat bed without using bedrails?: A Lot Help needed moving to and from a bed to a chair (including a wheelchair)?: Total Help needed standing up from a chair using your arms (e.g., wheelchair or bedside chair)?: Total Help needed to walk in hospital room?: Total Help needed climbing 3-5 steps with a railing? : Total 6 Click Score: 8    End of Session   Activity Tolerance: Other (comment) (Self limiting) Patient left: with call bell/phone within reach;in bed Nurse Communication: Mobility status PT Visit Diagnosis: Other abnormalities of gait and mobility (R26.89);Difficulty in walking, not elsewhere classified (R26.2);Hemiplegia and hemiparesis Hemiplegia - Right/Left: Left Hemiplegia - dominant/non-dominant: Non-dominant Hemiplegia - caused by: Cerebral infarction     Time: 1610-9604 PT Time Calculation (min) (ACUTE ONLY): 21 min  Charges:    $Therapeutic Activity: 8-22 mins PT General Charges $$ ACUTE PT VISIT: 1 Visit                     [facility] PT Acute Rehabilitation Services Office [phone]    [name] [facility] 09/15/2022, 1:30 PM

## 2022-09-15 NOTE — Progress Notes (Signed)
Patient ID: Charles Holmes, male   DOB: [birth year], 57 y.o.   MRN: [id number]     Advanced Heart Failure Rounding Note  PCP-Cardiologist: Charles Herrlich, MD  [facility]: Dr. Gala Holmes   Subjective:    7/25: Admit with cardiogenic shock. Started milrinone and NE. 8/6 S/P 13 teeth extractions  8/9 IABP placed 8/13 S/p HM III LVAD implant + clipping left atrial appendage d/t severe thickening and invagination of mitral valve annulus impeding flows. Apical core pathology consistent with nonischemic cardiomyopathy, did not confirm cardiac sarcoid 8/14 Left-sided hemiplegia. CT head with acute R MCA infarct. IR for thrombectomy. CT head with small to moderate area of hemorrhagic conversion 8/15 extubated  8/17 Low dose heparin gtt restarted, CT head with decreased size of hemorrhagic stroke.  8/18 Neurology signed off 8/19: Limited echo: Mod RV dysfunction, better mitral inflow into the LV and VAD position satisfactory. 8/26: Echo showed moderate-severe RV dysfunction but RV not enlarged.  Interventricular septum does bow towards towards the right. Speed increased to 5400 rpm, flow rose to 4.9 L/min.  Interventricular septum remained stable bowing slightly towards the right.    8/27: RHC with RA mean 5, PA 30/7, mean PCWP 4, CI 3.49, PAPi 4.6  Post Op Day #16  He is on epinephrine 2.5, off milrinone.  On midodrine 5 tid.  MAP stable. Co-ox 68%.    CVP 12. Lasix 80 mg IV bid yesterday.   INR 1.6 on heparin/warfarin overlap.   Went outside yesterday.  Unhappy because he was woken up from deep sleep.   LVAD Interrogation HM III: Speed: 5400 Flow: 4.9  PI: 2.6 Power: 4.  Objective:   Weight Range: 75.8 kg Body mass index is 26.17 kg/m.   Vital Signs:   Temp:  [97.5 F (36.4 C)-99.4 F (37.4 C)] 98.9 F (37.2 C) (08/30 6237) Pulse Rate:  [93-176] 150 (08/30 0800) Resp:  [14-29] 21 (08/30 0800) BP: (73-143)/(48-117) 110/83 (08/30 0800) SpO2:  [87 %-100 %] 94 % (08/30 0853) Weight:  [75.8  kg] 75.8 kg (08/30 0456) Last BM Date : 09/14/22  Weight change: Filed Weights   09/13/22 0452 09/14/22 0450 09/15/22 0456  Weight: 78.6 kg 76.2 kg 75.8 kg    Intake/Output:   Intake/Output Summary (Last 24 hours) at 09/15/2022 0858 Last data filed at 09/15/2022 0800 Gross per 24 hour  Intake 2081.32 ml  Output 2500 ml  Net -418.68 ml   Physical Exam   General: Well appearing this am. NAD.  HEENT: Normal. Neck: Supple, JVP 10 cm. Carotids OK.  Cardiac:  Mechanical heart sounds with LVAD hum present.  Lungs:  CTAB, normal effort.  Abdomen:  NT, ND, no HSM. No bruits or masses. +BS  LVAD exit site: Well-healed and incorporated. Dressing dry and intact. No erythema or drainage. Stabilization device present and accurately applied. Driveline dressing changed daily per sterile technique. Extremities:  Warm and dry. No cyanosis, clubbing, rash, or edema.  Neuro:  Alert & oriented x 3. Cranial nerves grossly intact. Moves all 4 extremities w/o difficulty. Affect pleasant     Telemetry     NSR 90s (personally reviewed)   Labs    CBC Recent Labs    09/14/22 0426 09/15/22 0354  WBC 13.1* 12.5*  HGB 8.7* 8.8*  HCT 28.7* 29.2*  MCV 98.0 96.1  PLT 539* 592*    Basic Metabolic Panel Recent Labs    62/83/15 0426 09/15/22 0354  NA 132* 137  K 4.3 4.2  CL 97* 95*  CO2 28 31  GLUCOSE 135* 129*  BUN 34* 39*  CREATININE 0.98 0.69  CALCIUM 8.2* 8.7*  MG 2.2 2.3    BNP: BNP (last 3 results) Recent Labs    08/30/22 0258 09/04/22 2144 09/11/22 2252  BNP 922.3* 1,050.6* 933.0*     Imaging    No results found.   Medications:     Scheduled Medications:  sodium chloride   Intravenous Once   aspirin  324 mg Oral Daily   atorvastatin  80 mg Oral Daily   Chlorhexidine Gluconate Cloth  6 each Topical Q0600   digoxin  0.125 mg Oral Daily   dorzolamide-timolol  1 drop Both Eyes BID   dronabinol  2.5 mg Oral BID AC   feeding supplement  237 mL Oral TID BM    feeding supplement (PROSource TF20)  60 mL Per Tube BID   feeding supplement (VITAL 1.5 CAL)  780 mL Per Tube Q24H   fiber supplement (BANATROL TF)  60 mL Per Tube TID   furosemide  80 mg Intravenous BID   Gerhardt's butt cream   Topical TID   insulin aspart  0-24 Units Subcutaneous TID WC & HS   latanoprost  1 drop Both Eyes QHS   melatonin  3 mg Oral QHS   mexiletine  250 mg Oral BID   midodrine  10 mg Oral Q8H   mirtazapine  7.5 mg Oral QHS   mometasone-formoterol  2 puff Inhalation BID   multivitamin with minerals  1 tablet Oral Daily   mouth rinse  15 mL Mouth Rinse 4 times per day   pantoprazole (PROTONIX) IV  40 mg Intravenous QHS   potassium chloride  40 mEq Oral Once   sildenafil  20 mg Oral TID   sodium chloride flush  3 mL Intravenous Q12H   umeclidinium bromide  1 puff Inhalation Daily   warfarin  6 mg Oral ONCE-1600   Warfarin - Pharmacist Dosing Inpatient   Does not apply q1600    Infusions:  sodium chloride Stopped (09/13/22 2035)   epinephrine 2.5 mcg/min (09/15/22 0800)   heparin 500 Units/hr (09/15/22 0800)    PRN Medications: sodium chloride, acetaminophen, albuterol, dextrose, ondansetron (ZOFRAN) IV, mouth rinse, oxyCODONE, polyethylene glycol, sodium chloride, sodium chloride flush, traMADol   Patient Profile  [name] is a 57 y.o. 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 Admitted with cardiogenic shock, stabilized and underwent HM3 LVAD. Post implant course c/b acute CVA.   Assessment/Plan  1.  Acute on chronic Systolic HF-->Cardiogenic Shock  - Diagnosed 11/2019. Presented with VT. LHC 70% LAD  - cMRI 12/21 concerning for sarcoid and EF 18%.  - PET 2/22 at [facility] EF 25% + active sarcoid - Echo 08/26/20 EF < 20% severely dilated LV RV mildly decreased.  - [employer] CRT-D upgrade in 06/08/21 - Echo 07/10/22: EF <20%, RV okay, mod pericardial effusion, mod MR/TR - Admitted 07/25 with  cardiogenic shock. - RHC: Nonobstructive CAD, severely elevated filling pressures and low Fick CO/CI (2.7/1.4) - 08/13 HM III LVAD implant + clipping LAA d/t severe thickening and invagination of mitral valve annulus impeding flows.  - Apical core sent - focal myocyte hypertrophy, myocyte loss and chronic inflammation,  consistent with nonischemic cardiomyopathy  - Speed increased to 5300 on 08/14.  - Echo 8/26 mod-sev RV dysfunction. Speed increased to 5400 8/26 - RHC 8/27: low filing pressures with excellent cardiac output on EPI,  PAPi 4.6 - On epinephrine 2.5 and off milrinone.  Will increase midodrine to 10 tid and try to wean off epinephrine today, ok for MAP 65 and above.  - CVP 12.  Continue Lasix 80 mg IV bid today.  - INR 1.6. Continue ASA 325, heparin gtt and warfarin.  2.  Acute stroke - Hx CVA 06/24 -Admitted 06/24 w/ R MCA stroke. S/p TPA and mechanical clot extraction. No residual deficits. Likely cardioembolic in setting of severe LV dysfunction. - Developed left sided weakness 08/14. CTA with R MCA infarct. Taken to IR for thrombectomy - Repeat CT head with small to moderate size hemorrhagic conversion. CT head this am stable. - D/w neuro, repeat head CT on 8/17 w/ improved hemorrhagic CVA, now back on low dose heparin per neurology.  - Back on warfarin - neurology signed off 09/03/22.  - C/w PT/OT. Needs to work with PT daily.  - Continue ASA 325  - Has Cor-Trak and still getting nocturnal tube feeds, intake improving. - will need CIR once medically stable for d/c (team following)   3. 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 Continue mexiletine  - now s/p ICD.   4. CAD - LHC 12/07/19 70-% LAD, no intervention - LHC 8/24 non obstructive CAD.  - Continue statin. On aspirin for VAD.  5. Cardiac sarcoid - PET 2/22 at [facility] EF 25% + active sarcoid - Has completed prednisone.  - holding methotrexate w/ recent surgery  - apical core  pathology not diagnostic of cardiac sarcoidosis.   6. 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 AKI - suspect cardiorenal, improved w/ inotropic support - Creatinine stable now.  - follow BMP w/ diuresis    8. Iron deficiency anemia/ Post-op anemia - recent T sat 15%, scheduled for OP feraheme. Will complete inpatient  - Transfused 1 u RBCs 8/15 - Hgb 8.8 today.    9. Pulmonary  - PFTs with severe obstructive defect, response to bronchodilator. FEV1 1.04L, FEV1/FVC 48% - extubated 8/15  - Cultures NGTD.   10. Elevated LFTs - AST/ALT stably elevated, normal bilirubin.   11. ID - WBC ct 22.5 => 18>16.5>13>12.5 - Now afebrile - completed Meropenem/vanc  - Cultures NGTD. - Incentive spirometer and flutter valve at bedside, encouraged to use  12. Depression - On Remeron - Adding Marinol for appetite.    CRITICAL CARE Performed by: Marca Ancona  Total critical care time: 40 minutes  Critical care time was exclusive of separately billable procedures and treating other patients.  Critical care was necessary to treat or prevent imminent or life-threatening deterioration.  Critical care was time spent personally by me on the following activities: development of treatment plan with patient and/or surrogate as well as nursing, discussions with consultants, evaluation of patient's response to treatment, examination of patient, obtaining history from patient or surrogate, ordering and performing treatments and interventions, ordering and review of laboratory studies, ordering and review of radiographic studies, pulse oximetry and re-evaluation of patient's condition.  Marca Ancona 09/15/2022 8:58 AM

## 2022-09-16 DIAGNOSIS — I5023 Acute on chronic systolic (congestive) heart failure: Secondary | ICD-10-CM | POA: Diagnosis not present

## 2022-09-16 DIAGNOSIS — I428 Other cardiomyopathies: Secondary | ICD-10-CM | POA: Diagnosis not present

## 2022-09-16 LAB — CBC
HCT: 30.4 % — ABNORMAL LOW (ref 39.0–52.0)
Hemoglobin: 9.2 g/dL — ABNORMAL LOW (ref 13.0–17.0)
MCH: 29.1 pg (ref 26.0–34.0)
MCHC: 30.3 g/dL (ref 30.0–36.0)
MCV: 96.2 fL (ref 80.0–100.0)
Platelets: 553 10*3/uL — ABNORMAL HIGH (ref 150–400)
RBC: 3.16 MIL/uL — ABNORMAL LOW (ref 4.22–5.81)
RDW: 17.4 % — ABNORMAL HIGH (ref 11.5–15.5)
WBC: 10.4 10*3/uL (ref 4.0–10.5)
nRBC: 0 % (ref 0.0–0.2)

## 2022-09-16 LAB — COMPREHENSIVE METABOLIC PANEL
ALT: 126 U/L — ABNORMAL HIGH (ref 0–44)
AST: 91 U/L — ABNORMAL HIGH (ref 15–41)
Albumin: 2.5 g/dL — ABNORMAL LOW (ref 3.5–5.0)
Alkaline Phosphatase: 147 U/L — ABNORMAL HIGH (ref 38–126)
Anion gap: 11 (ref 5–15)
BUN: 42 mg/dL — ABNORMAL HIGH (ref 6–20)
CO2: 32 mmol/L (ref 22–32)
Calcium: 8.7 mg/dL — ABNORMAL LOW (ref 8.9–10.3)
Chloride: 94 mmol/L — ABNORMAL LOW (ref 98–111)
Creatinine, Ser: 0.87 mg/dL (ref 0.61–1.24)
GFR, Estimated: >60 mL/min (ref >60)
Glucose, Bld: 126 mg/dL — ABNORMAL HIGH (ref 70–99)
Potassium: 4.4 mmol/L (ref 3.5–5.1)
Sodium: 137 mmol/L (ref 135–145)
Total Bilirubin: 0.8 mg/dL (ref 0.3–1.2)
Total Protein: 6.4 g/dL — ABNORMAL LOW (ref 6.5–8.1)

## 2022-09-16 LAB — PROTIME-INR
INR: 1.9 — ABNORMAL HIGH (ref 0.8–1.2)
INR: 1.7 — ABNORMAL HIGH (ref 0.8–1.2)
Prothrombin Time: 21.8 s — ABNORMAL HIGH (ref 11.4–15.2)
Prothrombin Time: 20.4 seconds — ABNORMAL HIGH (ref 11.4–15.2)

## 2022-09-16 LAB — GLUCOSE, CAPILLARY
Glucose-Capillary: 89 mg/dL (ref 70–99)
Glucose-Capillary: 107 mg/dL — ABNORMAL HIGH (ref 70–99)
Glucose-Capillary: 87 mg/dL (ref 70–99)
Glucose-Capillary: 128 mg/dL — ABNORMAL HIGH (ref 70–99)

## 2022-09-16 LAB — DIGOXIN LEVEL: Digoxin Level: 0.4 ng/mL — ABNORMAL LOW (ref 0.8–2.0)

## 2022-09-16 LAB — LACTATE DEHYDROGENASE: LDH: 277 U/L — ABNORMAL HIGH (ref 98–192)

## 2022-09-16 LAB — COOXEMETRY PANEL
Carboxyhemoglobin: 1.4 % (ref 0.5–1.5)
Methemoglobin: 4 % — ABNORMAL HIGH (ref 0.0–1.5)
O2 Saturation: 66.9 %
Total hemoglobin: 10.2 g/dL — ABNORMAL LOW (ref 12.0–16.0)

## 2022-09-16 LAB — MAGNESIUM: Magnesium: 2.3 mg/dL (ref 1.7–2.4)

## 2022-09-16 LAB — HEPARIN LEVEL (UNFRACTIONATED): Heparin Unfractionated: <0.1 [IU]/mL — ABNORMAL LOW (ref 0.30–0.70)

## 2022-09-16 MED ORDER — SERTRALINE HCL 25 MG PO TABS
50.0000 mg | ORAL_TABLET | Freq: Every day | ORAL | Status: DC
  Administered 2022-09-19 – 2022-09-27 (×9): 50 mg via ORAL
  Filled 2022-09-16 (×4): qty 2
  Filled 2022-09-16 (×2): qty 1
  Filled 2022-09-16 (×3): qty 2

## 2022-09-16 MED ORDER — TORSEMIDE 20 MG PO TABS
40.0000 mg | ORAL_TABLET | Freq: Every day | ORAL | Status: DC
  Administered 2022-09-16 – 2022-09-17 (×2): 40 mg via ORAL
  Filled 2022-09-16 (×2): qty 2

## 2022-09-16 MED ORDER — TORSEMIDE 20 MG PO TABS: 20.0000 mg | ORAL_TABLET | Freq: Every day | ORAL | Status: DC

## 2022-09-16 MED ORDER — SERTRALINE HCL 50 MG PO TABS: 25.0000 mg | ORAL_TABLET | Freq: Every day | ORAL | Status: DC

## 2022-09-16 MED ORDER — SERTRALINE HCL 50 MG PO TABS
25.0000 mg | ORAL_TABLET | Freq: Every day | ORAL | Status: AC
  Administered 2022-09-16 – 2022-09-18 (×3): 25 mg via ORAL
  Filled 2022-09-16 (×3): qty 1

## 2022-09-16 MED ORDER — WARFARIN SODIUM 3 MG PO TABS
6.0000 mg | ORAL_TABLET | Freq: Once | ORAL | Status: AC
  Administered 2022-09-16: 6 mg via ORAL
  Filled 2022-09-16: qty 2

## 2022-09-16 MED ORDER — HEPARIN (PORCINE) 25000 UT/250ML-% IV SOLN
500.0000 [IU]/h | INTRAVENOUS | Status: DC
  Administered 2022-09-16: 500 [IU]/h via INTRAVENOUS
  Filled 2022-09-16: qty 250

## 2022-09-16 MED ORDER — PANTOPRAZOLE SODIUM 40 MG PO TBEC
40.0000 mg | DELAYED_RELEASE_TABLET | Freq: Every day | ORAL | Status: DC
  Administered 2022-09-16 – 2022-09-26 (×11): 40 mg via ORAL
  Filled 2022-09-16 (×11): qty 1

## 2022-09-16 NOTE — Progress Notes (Signed)
4 Days Post-Op Procedure(s) (LRB): RIGHT HEART CATH (N/A) Subjective:  No specific complaints. Says he just wants some potato soup.  Up in chair.  Objective: Vital signs in last 24 hours: Temp:  [97.8 F (36.6 C)-98.8 F (37.1 C)] 98.2 F (36.8 C) (08/31 0642) Pulse Rate:  [30-180] 99 (08/31 0300) Cardiac Rhythm: Normal sinus rhythm (08/31 0738) Resp:  [15-23] 16 (08/31 0738) BP: (75-99)/(57-78) 99/78 (08/31 0738) SpO2:  [91 %-99 %] 98 % (08/31 0642) Weight:  [74.8 kg] 74.8 kg (08/31 0500)  Hemodynamic parameters for last 24 hours: CVP:  [6 mmHg-10 mmHg] 6 mmHg  Intake/Output from previous day: 08/30 0701 - 08/31 0700 In: 1638.2 [I.V.:142.1; NG/GT:1496.1] Out: 3000 [Urine:3000] Intake/Output this shift: Total I/O In: 70 [I.V.:5; NG/GT:65] Out: 0   General appearance: alert and flat affect Neurologic: left sided weakness Heart: regular rate and rhythm and LVAD humm Lungs: clear to auscultation bilaterally Extremities: edema mild Wound: chest incision healing well.  Lab Results: Recent Labs    09/15/22 0354 09/16/22 0539  WBC 12.5* 10.4  HGB 8.8* 9.2*  HCT 29.2* 30.4*  PLT 592* 553*   BMET:  Recent Labs    09/15/22 0354 09/16/22 0539  NA 137 137  K 4.2 4.4  CL 95* 94*  CO2 31 32  GLUCOSE 129* 126*  BUN 39* 42*  CREATININE 0.69 0.87  CALCIUM 8.7* 8.7*    PT/INR:  Recent Labs    09/16/22 0539  LABPROT 21.8*  INR 1.9*   ABG    Component Value Date/Time   PHART 7.365 08/31/2022 1504   HCO3 33.2 (H) 09/12/2022 1108   HCO3 31.2 (H) 09/12/2022 1108   TCO2 35 (H) 09/12/2022 1108   TCO2 33 (H) 09/12/2022 1108   ACIDBASEDEF 4.0 (H) 08/31/2022 1504   O2SAT 66.9 09/16/2022 0539   CBG (last 3)  Recent Labs    09/15/22 2130 09/15/22 2344 09/16/22 0813  GLUCAP 117* 109* 89    Assessment/Plan:  Overall he looks good from a medical standpoint.  INR up to 1.9 and plan to stop heparin when INR is 2.  Pacing wires still in and PVT wants to  leave in until next week.  Continue to encourage po nutrition. Nighttime tube feeds.  PT/OT.  Situational depression. On Zoloft.  LOS: 37 days    Alleen Borne 09/16/2022

## 2022-09-16 NOTE — Plan of Care (Signed)
  Problem: Education: Goal: Understanding of CV disease, CV risk reduction, and recovery process will improve Outcome: Progressing Goal: Individualized Educational Video(s) Outcome: Progressing   Problem: Activity: Goal: Ability to return to baseline activity level will improve Outcome: Progressing   Problem: Cardiovascular: Goal: Ability to achieve and maintain adequate cardiovascular perfusion will improve Outcome: Progressing   Problem: Education: Goal: Knowledge of General Education information will improve Description: Including pain rating scale, medication(s)/side effects and non-pharmacologic comfort measures Outcome: Progressing

## 2022-09-16 NOTE — Plan of Care (Signed)
  Problem: Education: Goal: Knowledge of General Education information will improve Description: Including pain rating scale, medication(s)/side effects and non-pharmacologic comfort measures Outcome: Progressing   Problem: Clinical Measurements: Goal: Will remain free from infection Outcome: Progressing   Problem: Activity: Goal: Risk for activity intolerance will decrease Outcome: Progressing   

## 2022-09-16 NOTE — Progress Notes (Signed)
Patient ID: Charles Holmes, male   DOB: [birth year], 57 y.o.   MRN: [id number]     Advanced Heart Failure Rounding Note  PCP-Cardiologist: Charles Herrlich, MD  [facility]: Dr. Gala Holmes   Subjective:    7/25: Admit with cardiogenic shock. Started milrinone and NE. 8/6 S/P 13 teeth extractions  8/9 IABP placed 8/13 S/p HM III LVAD implant + clipping left atrial appendage d/t severe thickening and invagination of mitral valve annulus impeding flows. Apical core pathology consistent with nonischemic cardiomyopathy, did not confirm cardiac sarcoid 8/14 Left-sided hemiplegia. CT head with acute R MCA infarct. IR for thrombectomy. CT head with small to moderate area of hemorrhagic conversion 8/15 extubated  8/17 Low dose heparin gtt restarted, CT head with decreased size of hemorrhagic stroke.  8/18 Neurology signed off 8/19: Limited echo: Mod RV dysfunction, better mitral inflow into the LV and VAD position satisfactory. 8/26: Echo showed moderate-severe RV dysfunction but RV not enlarged.  Interventricular septum does bow towards towards the right. Speed increased to 5400 rpm, flow rose to 4.9 L/min.  Interventricular septum remained stable bowing slightly towards the right.    8/27: RHC with RA mean 5, PA 30/7, mean PCWP 4, CI 3.49, PAPi 4.6  Post Op Day #17  Now off epi. On midodrine 10 tid. MAPs 70-80s CVP 12-14  On heparin/warfarin INR 1.9  Very frustrated and unmotivated.    LVAD Interrogation HM III: Speed: 5400 Flow: 4.5  PI: 3.3 Power: 4.0  Objective:   Weight Range: 74.8 kg Body mass index is 25.83 kg/m.   Vital Signs:   Temp:  [97.8 F (36.6 C)-98.8 F (37.1 C)] 98.2 F (36.8 C) (08/31 0642) Pulse Rate:  [30-180] 99 (08/31 0300) Resp:  [15-23] 16 (08/31 0738) BP: (75-99)/(57-78) 99/78 (08/31 0738) SpO2:  [91 %-99 %] 98 % (08/31 0642) Weight:  [74.8 kg] 74.8 kg (08/31 0500) Last BM Date : 09/14/22  Weight change: Filed Weights   09/14/22 0450 09/15/22 0456 09/16/22  0500  Weight: 76.2 kg 75.8 kg 74.8 kg    Intake/Output:   Intake/Output Summary (Last 24 hours) at 09/16/2022 0831 Last data filed at 09/16/2022 0701 Gross per 24 hour  Intake 1630.72 ml  Output 2600 ml  Net -969.28 ml   Physical Exam   General:  Sitting up in bed HEENT: normal  Neck: supple. JVP 12  Carotids 2+ bilat; no bruits. No lymphadenopathy or thryomegaly appreciated. Cor: LVAD hum.  Lungs: Clear. Abdomen: soft, nontender, non-distended. No hepatosplenomegaly. No bruits or masses. Good bowel sounds. Driveline site clean. Anchor in place.  Extremities: no cyanosis, clubbing, rash. 1+ edema  Neuro: alert & oriented x 3. No focal deficits. LUE weak   Telemetry     Sinus 90-100 Personally reviewed    Labs    CBC Recent Labs    09/15/22 0354 09/16/22 0539  WBC 12.5* 10.4  HGB 8.8* 9.2*  HCT 29.2* 30.4*  MCV 96.1 96.2  PLT 592* 553*    Basic Metabolic Panel Recent Labs    13/24/40 0354 09/16/22 0539  NA 137 137  K 4.2 4.4  CL 95* 94*  CO2 31 32  GLUCOSE 129* 126*  BUN 39* 42*  CREATININE 0.69 0.87  CALCIUM 8.7* 8.7*  MG 2.3 2.3    BNP: BNP (last 3 results) Recent Labs    08/30/22 0258 09/04/22 2144 09/11/22 2252  BNP 922.3* 1,050.6* 933.0*     Imaging    No results found.   Medications:  Scheduled Medications:  sodium chloride   Intravenous Once   aspirin  324 mg Oral Daily   atorvastatin  80 mg Oral Daily   Chlorhexidine Gluconate Cloth  6 each Topical Q0600   digoxin  0.125 mg Oral Daily   dorzolamide-timolol  1 drop Both Eyes BID   dronabinol  2.5 mg Oral BID AC   feeding supplement  237 mL Oral TID BM   feeding supplement (PROSource TF20)  60 mL Per Tube BID   feeding supplement (VITAL 1.5 CAL)  780 mL Per Tube Q24H   fiber supplement (BANATROL TF)  60 mL Per Tube TID   Gerhardt's butt cream   Topical TID   insulin aspart  0-24 Units Subcutaneous TID WC & HS   latanoprost  1 drop Both Eyes QHS   melatonin  3 mg Oral  QHS   mexiletine  250 mg Oral BID   midodrine  10 mg Oral Q8H   mirtazapine  7.5 mg Oral QHS   mometasone-formoterol  2 puff Inhalation BID   multivitamin with minerals  1 tablet Oral Daily   mouth rinse  15 mL Mouth Rinse 4 times per day   pantoprazole  40 mg Oral Daily   sildenafil  20 mg Oral TID   sodium chloride flush  3 mL Intravenous Q12H   torsemide  20 mg Oral Daily   umeclidinium bromide  1 puff Inhalation Daily   Warfarin - Pharmacist Dosing Inpatient   Does not apply q1600    Infusions:  sodium chloride Stopped (09/13/22 2035)   epinephrine Stopped (09/15/22 1357)   heparin 500 Units/hr (09/16/22 0738)    PRN Medications: sodium chloride, acetaminophen, albuterol, dextrose, ondansetron (ZOFRAN) IV, mouth rinse, oxyCODONE, polyethylene glycol, sodium chloride, sodium chloride flush, traMADol   Patient Profile  Charles Holmes is a 57 y.o. 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 Admitted with cardiogenic shock, stabilized and underwent HM3 LVAD. Post implant course c/b acute CVA.   Assessment/Plan  1.  Acute on chronic Systolic HF-->Cardiogenic Shock  - Diagnosed 11/2019. Presented with VT. LHC 70% LAD  - cMRI 12/21 concerning for sarcoid and EF 18%.  - PET 2/22 at [facility] EF 25% + active sarcoid - Echo 08/26/20 EF < 20% severely dilated LV RV mildly decreased.  - [employer]tr[employer] CRT-D upgrade in 06/08/21 - Echo 07/10/22: EF <20%, RV okay, mod pericardial effusion, mod Charles/TR - Admitted 07/25 with cardiogenic shock. - RHC: Nonobstructive CAD, severely elevated filling pressures and low Fick CO/CI (2.7/1.4) - 08/13 HM III LVAD implant + clipping LAA d/t severe thickening and invagination of mitral valve annulus impeding flows.  - Apical core sent - focal myocyte hypertrophy, myocyte loss and chronic inflammation,  consistent with nonischemic cardiomyopathy. No mention of sarcoid - Speed increased to 5300 on 08/14.   - Echo 8/26 mod-sev RV dysfunction. Speed increased to 5400 8/26 - RHC 8/27: low filing pressures with excellent cardiac output on EPI, PAPi 4.6 - Off epi today. On midodrine 10 tid . Co-ox 67% - CVP 12.  Start torsemide 40 daily.   2. HM-3 LVAD - VAD interrogated personally. Parameters stable. - LDH 277 - INR 1.9. Continue ASA 325, heparin gtt and warfarin. Recheck INR at noon. If 2.0 or greater stop heparin and pull EPW in 4 hours  3.  Acute stroke - Hx CVA 06/24 -Admitted 06/24 w/ R MCA stroke. S/p TPA and mechanical clot extraction.  No residual deficits. Likely cardioembolic in setting of severe LV dysfunction. - Developed left sided weakness 08/14. CTA with R MCA infarct. Taken to IR for thrombectomy - Repeat CT head with small to moderate size hemorrhagic conversion. CT head this am stable. - D/w neuro, repeat head CT on 8/17 w/ improved hemorrhagic CVA, now back on low dose heparin per neurology.  - Back on warfarin - neurology signed off 09/03/22.  - C/w PT/OT. Needs to work with PT daily.  - Continue ASA 325  - Has Cor-Trak and still getting nocturnal tube feeds, intake improving. - will need CIR once medically stable for d/c (team following)   4. 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 Continue mexiletine  - now s/p ICD.   5. CAD - LHC 12/07/19 70-% LAD, no intervention - LHC 8/24 non obstructive CAD.  - Continue statin. On aspirin for VAD. 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  - holding methotrexate w/ recent surgery  - apical core pathology not diagnostic of cardiac sarcoidosis.   7. Paroxsymal AT/AF - Currently in NSR/ST   8. AKI - suspect cardiorenal, improved w/ inotropic support - Creatinine stable now at 0.9 - follow BMP w/ diuresis    9. Iron deficiency anemia/ Post-op anemia - recent T sat 15%, scheduled for OP feraheme. Will complete inpatient  -  Transfused 1 u RBCs 8/15 - Hgb 8.8 -> 9.2 today.    9. Pulmonary  - PFTs with severe obstructive defect, response to bronchodilator. FEV1 1.04L, FEV1/FVC 48% - extubated 8/15  - Cultures NGTD.  - Stable  10. ID - WBC ct 22.5 => 18>16.5>13>12.5> 10.4 - Now afebrile - completed Meropenem/vanc  - Cultures NGTD. - Incentive spirometer and flutter valve at bedside, encouraged to use  11. Depression - On Remeron - Added Marinol for appetite.  - Add zoloft - Long talk about need to participate in therapy   CRITICAL CARE Performed by: Arvilla Meres  Total critical care time: 40 minutes  Critical care time was exclusive of separately billable procedures and treating other patients.  Critical care was necessary to treat or prevent imminent or life-threatening deterioration.  Critical care was time spent personally by me on the following activities: development of treatment plan with patient and/or surrogate as well as nursing, discussions with consultants, evaluation of patient's response to treatment, examination of patient, obtaining history from patient or surrogate, ordering and performing treatments and interventions, ordering and review of laboratory studies, ordering and review of radiographic studies, pulse oximetry and re-evaluation of patient's condition.  Arvilla Meres 09/16/2022 8:31 AM

## 2022-09-16 NOTE — Progress Notes (Addendum)
ANTICOAGULATION CONSULT NOTE  Pharmacy Consult for heparin + warfarin Indication:  LVAD  Allergies  Allergen Reactions   Amiodarone     Severe tremors   Percocet [Oxycodone-Acetaminophen] Itching    Patient Measurements: Height: 5\' 7"  (170.2 cm) Weight: 74.8 kg (164 lb 14.5 oz) IBW/kg (Calculated) : 66.1 Heparin Dosing Weight: 87kg  Vital Signs: Temp: 97.7 F (36.5 C) (08/31 1118) Temp Source: Oral (08/31 1118) BP: 78/64 (08/31 1300) Pulse Rate: 31 (08/31 1300)  Labs: Recent Labs    09/14/22 0426 09/15/22 0354 09/16/22 0539 09/16/22 1200  HGB 8.7* 8.8* 9.2*  --   HCT 28.7* 29.2* 30.4*  --   PLT 539* 592* 553*  --   LABPROT 18.5* 19.7* 21.8* 20.4*  INR 1.5* 1.6* 1.9* 1.7*  HEPARINUNFRC <0.10* <0.10* <0.10*  --   CREATININE 0.98 0.69 0.87  --     Estimated Creatinine Clearance: 85.5 mL/min (by C-G formula based on SCr of 0.87 mg/dL).   Medical History: Past Medical History:  Diagnosis Date   CAD (coronary artery disease)    CHF (congestive heart failure) (HCC)    GERD (gastroesophageal reflux disease)    Hyperlipidemia    Hypertension    Systolic heart failure (HCC) 2021   LVEF 18%, RVEF 38% on cardiac MRI 12/19/2019. possible cardiac sarcoidosis.   Wide-complex tachycardia 2021   wears LifeVest     Assessment: 57yoM on apixaban PTA for hx AF admitted for LVAD workup. Pt s/p HM3 implant on 8/13 c/b acute CVA postop. Head CT 8/17 stable, ok to start low dose heparin per neuro, warfarin started 8/19.  Heparin level <0.1 as expected, CBC and LDH stable, INR 1.9 this morning (repeat INR ordered at noon for potentially removing pacing wires, INR = 1.7).  Trending up despite drinking Ensures TID, started on Marinol yesterday which will increase INR.  Goal of Therapy:  Heparin level <0.1 units/ml Monitor platelets by anticoagulation protocol: Yes   Plan:  Continue Heparin 500 units/h - no up titrations  Warfarin 6mg  x1 tonight Daily INR, heparin level,  CBC  Trixie Rude, PharmD Clinical Pharmacist 09/16/2022  1:30 PM  Please check AMION for all [facility] Pharm[facility] phone numbers After 10:00 PM, call Main Pharmacy [phone]

## 2022-09-17 DIAGNOSIS — I5023 Acute on chronic systolic (congestive) heart failure: Secondary | ICD-10-CM | POA: Diagnosis not present

## 2022-09-17 DIAGNOSIS — I428 Other cardiomyopathies: Secondary | ICD-10-CM | POA: Diagnosis not present

## 2022-09-17 LAB — CBC
HCT: 29.4 % — ABNORMAL LOW (ref 39.0–52.0)
Hemoglobin: 9 g/dL — ABNORMAL LOW (ref 13.0–17.0)
MCH: 29.4 pg (ref 26.0–34.0)
MCHC: 30.6 g/dL (ref 30.0–36.0)
MCV: 96.1 fL (ref 80.0–100.0)
Platelets: 542 10*3/uL — ABNORMAL HIGH (ref 150–400)
RBC: 3.06 MIL/uL — ABNORMAL LOW (ref 4.22–5.81)
RDW: 17.7 % — ABNORMAL HIGH (ref 11.5–15.5)
WBC: 9.9 10*3/uL (ref 4.0–10.5)
nRBC: 0 % (ref 0.0–0.2)

## 2022-09-17 LAB — GLUCOSE, CAPILLARY
Glucose-Capillary: 103 mg/dL — ABNORMAL HIGH (ref 70–99)
Glucose-Capillary: 98 mg/dL (ref 70–99)
Glucose-Capillary: 108 mg/dL — ABNORMAL HIGH (ref 70–99)
Glucose-Capillary: 113 mg/dL — ABNORMAL HIGH (ref 70–99)

## 2022-09-17 LAB — COMPREHENSIVE METABOLIC PANEL
ALT: 104 U/L — ABNORMAL HIGH (ref 0–44)
AST: 69 U/L — ABNORMAL HIGH (ref 15–41)
Albumin: 2.4 g/dL — ABNORMAL LOW (ref 3.5–5.0)
Alkaline Phosphatase: 142 U/L — ABNORMAL HIGH (ref 38–126)
Anion gap: 11 (ref 5–15)
BUN: 43 mg/dL — ABNORMAL HIGH (ref 6–20)
CO2: 30 mmol/L (ref 22–32)
Calcium: 8.7 mg/dL — ABNORMAL LOW (ref 8.9–10.3)
Chloride: 94 mmol/L — ABNORMAL LOW (ref 98–111)
Creatinine, Ser: 0.9 mg/dL (ref 0.61–1.24)
GFR, Estimated: >60 mL/min (ref >60)
Glucose, Bld: 123 mg/dL — ABNORMAL HIGH (ref 70–99)
Potassium: 4 mmol/L (ref 3.5–5.1)
Sodium: 135 mmol/L (ref 135–145)
Total Bilirubin: 1.2 mg/dL (ref 0.3–1.2)
Total Protein: 6.2 g/dL — ABNORMAL LOW (ref 6.5–8.1)

## 2022-09-17 LAB — HEPARIN LEVEL (UNFRACTIONATED)
Heparin Unfractionated: 0.42 [IU]/mL (ref 0.30–0.70)
Heparin Unfractionated: <0.1 [IU]/mL — ABNORMAL LOW (ref 0.30–0.70)

## 2022-09-17 LAB — PROTIME-INR
INR: 2.2 — ABNORMAL HIGH (ref 0.8–1.2)
Prothrombin Time: 24.6 s — ABNORMAL HIGH (ref 11.4–15.2)

## 2022-09-17 LAB — LACTATE DEHYDROGENASE: LDH: 261 U/L — ABNORMAL HIGH (ref 98–192)

## 2022-09-17 LAB — MAGNESIUM: Magnesium: 2.3 mg/dL (ref 1.7–2.4)

## 2022-09-17 MED ORDER — WARFARIN SODIUM 2 MG PO TABS
4.0000 mg | ORAL_TABLET | Freq: Once | ORAL | Status: AC
  Administered 2022-09-17: 4 mg via ORAL
  Filled 2022-09-17: qty 2

## 2022-09-17 NOTE — Progress Notes (Signed)
ANTICOAGULATION CONSULT NOTE  Pharmacy Consult for heparin + warfarin Indication:  LVAD  Allergies  Allergen Reactions   Amiodarone     Severe tremors   Percocet [Oxycodone-Acetaminophen] Itching    Patient Measurements: Height: 5\' 7"  (170.2 cm) Weight: 74.5 kg (164 lb 3.9 oz) IBW/kg (Calculated) : 66.1 Heparin Dosing Weight: 87kg  Vital Signs: Temp: 99 F (37.2 C) (09/01 0636) Temp Source: Axillary (09/01 0636) BP: 102/66 (09/01 0700) Pulse Rate: 89 (09/01 0700)  Labs: Recent Labs    09/15/22 0354 09/16/22 0539 09/16/22 1200 09/17/22 0414 09/17/22 0553  HGB 8.8* 9.2*  --  9.0*  --   HCT 29.2* 30.4*  --  29.4*  --   PLT 592* 553*  --  542*  --   LABPROT 19.7* 21.8* 20.4* 24.6*  --   INR 1.6* 1.9* 1.7* 2.2*  --   HEPARINUNFRC <0.10* <0.10*  --  0.42 <0.10*  CREATININE 0.69 0.87  --  0.90  --     Estimated Creatinine Clearance: 82.6 mL/min (by C-G formula based on SCr of 0.9 mg/dL).   Medical History: Past Medical History:  Diagnosis Date   CAD (coronary artery disease)    CHF (congestive heart failure) (HCC)    GERD (gastroesophageal reflux disease)    Hyperlipidemia    Hypertension    Systolic heart failure (HCC) 2021   LVEF 18%, RVEF 38% on cardiac MRI 12/19/2019. possible cardiac sarcoidosis.   Wide-complex tachycardia 2021   wears LifeVest     Assessment: 57yoM on apixaban PTA for hx AF admitted for LVAD workup. Pt s/p HM3 implant on 8/13 c/b acute CVA postop. Head CT 8/17 stable, ok to start low dose heparin per neuro, warfarin started 8/19.  INR 2.2 at goal.  CBC, LDH stable.    Goal of Therapy:  Heparin level <0.1 units/ml Monitor platelets by anticoagulation protocol: Yes   Plan:  Stop heparin infusion with INR > 2 Warfarin 4mg  x1 tonight Daily INR, heparin level, CBC  Trixie Rude, PharmD Clinical Pharmacist 09/17/2022  7:38 AM  Please check AMION for all [facility] Pharm[facility] phone numbers After 10:00 PM, call Main Pharmacy [phone]

## 2022-09-17 NOTE — Progress Notes (Signed)
Patient ID: BAYANI CESENA, male   DOB: 10-08-1965, 57 y.o.   MRN: [id number]     Advanced Heart Failure Rounding Note  PCP-Cardiologist: Norman Herrlich, MD  [facility]: Dr. Gala Romney   Subjective:    7/25: Admit with cardiogenic shock. Started milrinone and NE. 8/6 S/P 13 teeth extractions  8/9 IABP placed 8/13 S/p HM III LVAD implant + clipping left atrial appendage d/t severe thickening and invagination of mitral valve annulus impeding flows. Apical core pathology consistent with nonischemic cardiomyopathy, did not confirm cardiac sarcoid 8/14 Left-sided hemiplegia. CT head with acute R MCA infarct. IR for thrombectomy. CT head with small to moderate area of hemorrhagic conversion 8/15 extubated  8/17 Low dose heparin gtt restarted, CT head with decreased size of hemorrhagic stroke.  8/18 Neurology signed off 8/19: Limited echo: Mod RV dysfunction, better mitral inflow into the LV and VAD position satisfactory. 8/26: Echo showed moderate-severe RV dysfunction but RV not enlarged.  Interventricular septum does bow towards towards the right. Speed increased to 5400 rpm, flow rose to 4.9 L/min.  Interventricular septum remained stable bowing slightly towards the right.    8/27: RHC with RA mean 5, PA 30/7, mean PCWP 4, CI 3.49, PAPi 4.6  Post Op Day #18  On midodrine 10 tid. MAPs 80-90s CVP 10-12  On heparin/warfarin INR 2.2 -> heparin stopped this am   Eating well. More motivated today. Up to chair. Walking with assistance. Wants potato soup.    LVAD Interrogation HM III: Speed: 5400 Flow: 4.3  PI: 4.7 Power: 3.3 VAD interrogated personally. Parameters stable.   Objective:   Weight Range: 74.5 kg Body mass index is 25.72 kg/m.   Vital Signs:   Temp:  [98.1 F (36.7 C)-99.6 F (37.6 C)] 98.1 F (36.7 C) (09/01 1103) Pulse Rate:  [30-138] 138 (09/01 0800) Resp:  [16-23] 19 (09/01 0800) BP: (67-112)/(53-88) 112/84 (09/01 0800) SpO2:  [91 %-97 %] 97 % (09/01 0800) Weight:  [74.5  kg] 74.5 kg (09/01 0457) Last BM Date : 09/14/22  Weight change: Filed Weights   09/15/22 0456 09/16/22 0500 09/17/22 0457  Weight: 75.8 kg 74.8 kg 74.5 kg    Intake/Output:   Intake/Output Summary (Last 24 hours) at 09/17/2022 1301 Last data filed at 09/17/2022 0900 Gross per 24 hour  Intake 881.14 ml  Output 1500 ml  Net -618.86 ml   Physical Exam   General:  NAD.  HEENT: normal + cor-trak Neck: supple. JVP not elevated.  Carotids 2+ bilat; no bruits. No lymphadenopathy or thryomegaly appreciated. Cor: LVAD hum.  Lungs: Clear. Abdomen:soft, nontender, non-distended. No hepatosplenomegaly. No bruits or masses. Good bowel sounds. Driveline site clean. Anchor in place.  Extremities: no cyanosis, clubbing, rash. Warm no edema  Neuro: alert & oriented x 3. Weak LUE    Telemetry     Sinus 90sPersonally reviewed    Labs    CBC Recent Labs    09/16/22 0539 09/17/22 0414  WBC 10.4 9.9  HGB 9.2* 9.0*  HCT 30.4* 29.4*  MCV 96.2 96.1  PLT 553* 542*    Basic Metabolic Panel Recent Labs    40/98/11 0539 09/17/22 0414  NA 137 135  K 4.4 4.0  CL 94* 94*  CO2 32 30  GLUCOSE 126* 123*  BUN 42* 43*  CREATININE 0.87 0.90  CALCIUM 8.7* 8.7*  MG 2.3 2.3    BNP: BNP (last 3 results) Recent Labs    08/30/22 0258 09/04/22 2144 09/11/22 2252  BNP 922.3* 1,050.6* 933.0*  Imaging    No results found.   Medications:     Scheduled Medications:  sodium chloride   Intravenous Once   aspirin  324 mg Oral Daily   atorvastatin  80 mg Oral Daily   Chlorhexidine Gluconate Cloth  6 each Topical Q0600   digoxin  0.125 mg Oral Daily   dorzolamide-timolol  1 drop Both Eyes BID   dronabinol  2.5 mg Oral BID AC   feeding supplement  237 mL Oral TID BM   feeding supplement (PROSource TF20)  60 mL Per Tube BID   feeding supplement (VITAL 1.5 CAL)  780 mL Per Tube Q24H   fiber supplement (BANATROL TF)  60 mL Per Tube TID   Gerhardt's butt cream   Topical TID    insulin aspart  0-24 Units Subcutaneous TID WC & HS   latanoprost  1 drop Both Eyes QHS   melatonin  3 mg Oral QHS   mexiletine  250 mg Oral BID   midodrine  10 mg Oral Q8H   mirtazapine  7.5 mg Oral QHS   mometasone-formoterol  2 puff Inhalation BID   multivitamin with minerals  1 tablet Oral Daily   mouth rinse  15 mL Mouth Rinse 4 times per day   pantoprazole  40 mg Oral Daily   sertraline  25 mg Oral Daily   Followed by   Melene Muller ON 09/19/2022] sertraline  50 mg Oral Daily   sildenafil  20 mg Oral TID   sodium chloride flush  3 mL Intravenous Q12H   torsemide  40 mg Oral Daily   umeclidinium bromide  1 puff Inhalation Daily   warfarin  4 mg Oral ONCE-1600   Warfarin - Pharmacist Dosing Inpatient   Does not apply q1600    Infusions:  sodium chloride Stopped (09/13/22 2035)   epinephrine Stopped (09/15/22 1357)    PRN Medications: sodium chloride, acetaminophen, albuterol, dextrose, ondansetron (ZOFRAN) IV, mouth rinse, oxyCODONE, polyethylene glycol, sodium chloride, sodium chloride flush, traMADol   Patient Profile  Mr Outman is a 57 y.o. 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 Admitted with cardiogenic shock, stabilized and underwent HM3 LVAD. Post implant course c/b acute CVA.   Assessment/Plan  1.  Acute on chronic Systolic HF-->Cardiogenic Shock  - Diagnosed 11/2019. Presented with VT. LHC 70% LAD  - cMRI 12/21 concerning for sarcoid and EF 18%.  - PET 2/22 at [facility] EF 25% + active sarcoid - Echo 08/26/20 EF < 20% severely dilated LV RV mildly decreased.  - [employer]tronic CRT-D upgrade in 06/08/21 - Echo 07/10/22: EF <20%, RV okay, mod pericardial effusion, mod MR/TR - Admitted 07/25 with cardiogenic shock. - RHC: Nonobstructive CAD, severely elevated filling pressures and low Fick CO/CI (2.7/1.4) - 08/13 HM III LVAD implant + clipping LAA d/t severe thickening and invagination of mitral valve annulus  impeding flows.  - Apical core sent - focal myocyte hypertrophy, myocyte loss and chronic inflammation,  consistent with nonischemic cardiomyopathy. No mention of sarcoid - Speed increased to 5300 on 08/14.  - Echo 8/26 mod-sev RV dysfunction. Speed increased to 5400 8/26 - RHC 8/27: low filing pressures with excellent cardiac output on EPI, PAPi 4.6 - Off epi today. On midodrine 10 tid. MAPs 80-90s. May be able to drop a bit soon  - CVP 10-12.  Continue torsemide  2. HM-3 LVAD - VAD interrogated personally. Parameters stable. - LDH 261 - INR 2.2 Continue  ASA 325. Stop heparin. D/w PharmD   3.  Acute stroke - Hx CVA 06/24 -Admitted 06/24 w/ R MCA stroke. S/p TPA and mechanical clot extraction. No residual deficits. Likely cardioembolic in setting of severe LV dysfunction. - Developed left sided weakness 08/14. CTA with R MCA infarct. Taken to IR for thrombectomy - Repeat CT head with small to moderate size hemorrhagic conversion.  - repeat head CT on 8/17 w/ improved hemorrhagic CVA - Back on warfarin - neurology signed off 09/03/22.  - C/w PT/OT. Needs to work with PT daily. Improving steadily  - Continue ASA 325  - will need CIR once medically stable for d/c (team following)   4. 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 Continue mexiletine  - now s/p ICD.   5. CAD - LHC 12/07/19 70-% LAD, no intervention - LHC 8/24 non obstructive CAD.  - Continue statin. On aspirin for VAD. 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  - holding methotrexate w/ recent surgery  - apical core pathology not diagnostic of cardiac sarcoidosis.   7. Paroxsymal AT/AF - Currently in NSR/ST   8. AKI - suspect cardiorenal, improved w/ inotropic support - Creatinine stable now at 0.9 - follow BMP w/ diuresis    9. Iron deficiency anemia/ Post-op anemia - recent T sat 15%, scheduled for OP feraheme.  Will complete inpatient  - Transfused 1 u RBCs 8/15 - Hgb 8.8 -> 9.2 today.    9. Pulmonary  - PFTs with severe obstructive defect, response to bronchodilator. FEV1 1.04L, FEV1/FVC 48% - extubated 8/15  - Cultures NGTD.  - Stable  10. ID - WBC 9.9 - Now afebrile - completed Meropenem/vanc  - Cultures NGTD. - Incentive spirometer and flutter valve at bedside, encouraged to use  11. Depression - On Remeron - On Marinol for appetite.  - Added zoloft - Long talk about need to participate in therapy   Arvilla Meres 09/17/2022 1:01 PM

## 2022-09-17 NOTE — Progress Notes (Signed)
Subjective:  No specific complaints. Says he just wants some potato soup. Ate well yesterday and ate most of breakfast.   Up in chair, getting ready to ambulate.  Objective: Vital signs in last 24 hours: Temp:  [97.7 F (36.5 C)-99.6 F (37.6 C)] 99 F (37.2 C) (09/01 0636) Pulse Rate:  [30-92] 89 (09/01 0700) Cardiac Rhythm: Normal sinus rhythm (09/01 0400) Resp:  [16-24] 18 (09/01 0700) BP: (67-107)/(53-88) 102/66 (09/01 0700) SpO2:  [91 %-97 %] 96 % (09/01 0700) Weight:  [74.5 kg] 74.5 kg (09/01 0457)  Hemodynamic parameters for last 24 hours: CVP:  [7 mmHg-28 mmHg] 12 mmHg  Intake/Output from previous day: 08/31 0701 - 09/01 0700 In: 1426.9 [P.O.:450; I.V.:103.4; NG/GT:873.5] Out: 1615 [Urine:1585; Emesis/NG output:30] Intake/Output this shift: No intake/output data recorded.  General appearance: alert and cooperative, affect improved today. Neurologic: left sided weakness, predominantly upper ext. Heart: regular rate and rhythm, LVAD humm Lungs: clear to auscultation bilaterally Abdomen: soft, non-tender; bowel sounds normal Extremities: mild edema Wound: incision healing well.   Lab Results: Recent Labs    09/16/22 0539 09/17/22 0414  WBC 10.4 9.9  HGB 9.2* 9.0*  HCT 30.4* 29.4*  PLT 553* 542*   BMET:  Recent Labs    09/16/22 0539 09/17/22 0414  NA 137 135  K 4.4 4.0  CL 94* 94*  CO2 32 30  GLUCOSE 126* 123*  BUN 42* 43*  CREATININE 0.87 0.90  CALCIUM 8.7* 8.7*    PT/INR:  Recent Labs    09/17/22 0414  LABPROT 24.6*  INR 2.2*   ABG    Component Value Date/Time   PHART 7.365 08/31/2022 1504   HCO3 33.2 (H) 09/12/2022 1108   HCO3 31.2 (H) 09/12/2022 1108   TCO2 35 (H) 09/12/2022 1108   TCO2 33 (H) 09/12/2022 1108   ACIDBASEDEF 4.0 (H) 08/31/2022 1504   O2SAT 66.9 09/16/2022 0539   CBG (last 3)  Recent Labs    09/16/22 1610 09/16/22 2158 09/17/22 0559  GLUCAP 87 128* 103*    Assessment/Plan:  He is doing well overall. LVAD  functioning appropriately.  INR 2.2 this am and heparin stopped.  Still has pacing wires but PVT wants to leave them in this weekend.  He is eating better and could try to remove the feeding tube which will make him feel better.  PT/OT. He would likely benefit from CIR soon.    LOS: 38 days    Alleen Borne 09/17/2022

## 2022-09-18 DIAGNOSIS — I5023 Acute on chronic systolic (congestive) heart failure: Secondary | ICD-10-CM | POA: Diagnosis not present

## 2022-09-18 DIAGNOSIS — I428 Other cardiomyopathies: Secondary | ICD-10-CM | POA: Diagnosis not present

## 2022-09-18 LAB — CULTURE, BLOOD (ROUTINE X 2)
Culture: NO GROWTH
Culture: NO GROWTH
Special Requests: ADEQUATE

## 2022-09-18 LAB — CBC
HCT: 30.7 % — ABNORMAL LOW (ref 39.0–52.0)
Hemoglobin: 9.3 g/dL — ABNORMAL LOW (ref 13.0–17.0)
MCH: 29.8 pg (ref 26.0–34.0)
MCHC: 30.3 g/dL (ref 30.0–36.0)
MCV: 98.4 fL (ref 80.0–100.0)
Platelets: 519 10*3/uL — ABNORMAL HIGH (ref 150–400)
RBC: 3.12 MIL/uL — ABNORMAL LOW (ref 4.22–5.81)
RDW: 18 % — ABNORMAL HIGH (ref 11.5–15.5)
WBC: 11.3 10*3/uL — ABNORMAL HIGH (ref 4.0–10.5)
nRBC: 0 % (ref 0.0–0.2)

## 2022-09-18 LAB — COMPREHENSIVE METABOLIC PANEL
ALT: 86 U/L — ABNORMAL HIGH (ref 0–44)
AST: 57 U/L — ABNORMAL HIGH (ref 15–41)
Albumin: 2.6 g/dL — ABNORMAL LOW (ref 3.5–5.0)
Alkaline Phosphatase: 139 U/L — ABNORMAL HIGH (ref 38–126)
Anion gap: 9 (ref 5–15)
BUN: 37 mg/dL — ABNORMAL HIGH (ref 6–20)
CO2: 31 mmol/L (ref 22–32)
Calcium: 8.7 mg/dL — ABNORMAL LOW (ref 8.9–10.3)
Chloride: 97 mmol/L — ABNORMAL LOW (ref 98–111)
Creatinine, Ser: 0.98 mg/dL (ref 0.61–1.24)
GFR, Estimated: >60 mL/min (ref >60)
Glucose, Bld: 80 mg/dL (ref 70–99)
Potassium: 3.8 mmol/L (ref 3.5–5.1)
Sodium: 137 mmol/L (ref 135–145)
Total Bilirubin: 0.8 mg/dL (ref 0.3–1.2)
Total Protein: 6.8 g/dL (ref 6.5–8.1)

## 2022-09-18 LAB — MAGNESIUM: Magnesium: 2.3 mg/dL (ref 1.7–2.4)

## 2022-09-18 LAB — PROTIME-INR
INR: 2.3 — ABNORMAL HIGH (ref 0.8–1.2)
Prothrombin Time: 25.3 s — ABNORMAL HIGH (ref 11.4–15.2)

## 2022-09-18 LAB — LACTATE DEHYDROGENASE: LDH: 284 U/L — ABNORMAL HIGH (ref 98–192)

## 2022-09-18 LAB — GLUCOSE, CAPILLARY
Glucose-Capillary: 86 mg/dL (ref 70–99)
Glucose-Capillary: 82 mg/dL (ref 70–99)
Glucose-Capillary: 129 mg/dL — ABNORMAL HIGH (ref 70–99)
Glucose-Capillary: 100 mg/dL — ABNORMAL HIGH (ref 70–99)

## 2022-09-18 LAB — BRAIN NATRIURETIC PEPTIDE: B Natriuretic Peptide: 809 pg/mL — ABNORMAL HIGH (ref 0.0–100.0)

## 2022-09-18 MED ORDER — ASPIRIN 81 MG PO TBEC
81.0000 mg | DELAYED_RELEASE_TABLET | Freq: Every day | ORAL | Status: DC
  Administered 2022-09-19 – 2022-09-27 (×9): 81 mg via ORAL
  Filled 2022-09-18 (×9): qty 1

## 2022-09-18 MED ORDER — POTASSIUM CHLORIDE CRYS ER 20 MEQ PO TBCR: 20.0000 meq | EXTENDED_RELEASE_TABLET | Freq: Once | ORAL | Status: DC

## 2022-09-18 MED ORDER — WARFARIN SODIUM 2 MG PO TABS
4.0000 mg | ORAL_TABLET | Freq: Once | ORAL | Status: AC
  Administered 2022-09-18: 4 mg via ORAL
  Filled 2022-09-18: qty 2

## 2022-09-18 MED ORDER — POTASSIUM CHLORIDE CRYS ER 20 MEQ PO TBCR
20.0000 meq | EXTENDED_RELEASE_TABLET | Freq: Two times a day (BID) | ORAL | Status: AC
  Administered 2022-09-18 (×2): 20 meq via ORAL
  Filled 2022-09-18 (×2): qty 1

## 2022-09-18 NOTE — Progress Notes (Signed)
ANTICOAGULATION CONSULT NOTE  Pharmacy Consult for heparin + warfarin Indication:  LVAD  Allergies  Allergen Reactions   Amiodarone     Severe tremors   Percocet [Oxycodone-Acetaminophen] Itching    Patient Measurements: Height: 5\' 7"  (170.2 cm) Weight: 74.2 kg (163 lb 9.3 oz) IBW/kg (Calculated) : 66.1 Heparin Dosing Weight: 87kg  Vital Signs: Temp: 97.8 F (36.6 C) (09/02 0800) Temp Source: Oral (09/02 0800) BP: 79/61 (09/02 0800) Pulse Rate: 31 (09/02 0945)  Labs: Recent Labs    09/16/22 0539 09/16/22 1200 09/17/22 0414 09/17/22 0553 09/18/22 0410  HGB 9.2*  --  9.0*  --  9.3*  HCT 30.4*  --  29.4*  --  30.7*  PLT 553*  --  542*  --  519*  LABPROT 21.8* 20.4* 24.6*  --  25.3*  INR 1.9* 1.7* 2.2*  --  2.3*  HEPARINUNFRC <0.10*  --  0.42 <0.10*  --   CREATININE 0.87  --  0.90  --  0.98    Estimated Creatinine Clearance: 75.9 mL/min (by C-G formula based on SCr of 0.98 mg/dL).   Medical History: Past Medical History:  Diagnosis Date   CAD (coronary artery disease)    CHF (congestive heart failure) (HCC)    GERD (gastroesophageal reflux disease)    Hyperlipidemia    Hypertension    Systolic heart failure (HCC) 2021   LVEF 18%, RVEF 38% on cardiac MRI 12/19/2019. possible cardiac sarcoidosis.   Wide-complex tachycardia 2021   wears LifeVest     Assessment: 57yoM on apixaban PTA for hx AF admitted for LVAD workup. Pt s/p HM3 implant on 8/13 c/b acute CVA postop. Head CT 8/17 stable, ok to start low dose heparin per neuro, warfarin started 8/19.  INR 2.3 at goal.  CBC, LDH stable.    Goal of Therapy:  INR 2-3 Monitor platelets by anticoagulation protocol: Yes   Plan:  Warfarin 4mg  x1 tonight Daily INR, heparin level, CBC  Trixie Rude, PharmD Clinical Pharmacist 09/18/2022  10:19 AM  Please check AMION for all [facility] phone numbers After 10:00 PM, call Main Pharmacy [phone]

## 2022-09-18 NOTE — Progress Notes (Signed)
Physical Therapy Treatment Patient Details Name: Charles Holmes MRN: [id number] DOB: 08/25/1965 Today's Date: 09/18/2022   History of Present Illness Pt is 57 year old presented to [facility] on  08/10/22 for acute on chronic systolic heart failure. Pt had IABP placed on 08/25/22 for ongoing cardiogenic shock. Underwent placement of LVAD on 8/13. Developed lt sided weakness on 8/14. Rt MCA CVA. Underwent mechanical thrombectomy by IR. Extubated 8/15.  PMH - CAD, CHF, HLD, HTN, systolic HF, ICD implant, rt CVA.  LVAD placement and new stroke R MCA M1.    PT Comments  Pt up in chair for second time today and had amb earlier with nursing in hallway. Pt agreeable to short bout of mobility. Much improved overall strength and mobility. Worked on quality of gait with emphasis on full trunk extension. Using EVA walker gave pt's LUE more support and will need to consider what assistive device will be next step in progression.     If plan is discharge home, recommend the following:     Can travel by private vehicle        Equipment Recommendations  Other (comment) (To be determined)    Recommendations for Other Services       Precautions / Restrictions Precautions Precautions: Sternal;Fall Precaution Comments: LVAD Restrictions Other Position/Activity Restrictions: Sternal Precautions     Mobility  Bed Mobility               General bed mobility comments: Pt up in chair    Transfers Overall transfer level: Needs assistance Equipment used: Rollator (4 wheels) Transfers: Sit to/from Stand Sit to Stand: +2 physical assistance, Mod assist           General transfer comment: Assist to bring hips up and to assist placing LUE onto EVA walker. Verbal/tactile cues for hand placement    Ambulation/Gait Ambulation/Gait assistance: Min assist Gait Distance (Feet): 20 Feet Assistive device: Fara Boros Gait Pattern/deviations: Step-through pattern, Decreased step length - right, Decreased  step length - left, Trunk flexed Gait velocity: decr Gait velocity interpretation: <1.31 ft/sec, indicative of household ambulator   General Gait Details: Assist for balance and support and to Control and instrumentation engineer     Tilt Bed    Modified Rankin (Stroke Patients Only) Modified Rankin (Stroke Patients Only) Pre-Morbid Rankin Score: No symptoms Modified Rankin: Moderately severe disability     Balance Overall balance assessment: Needs assistance Sitting-balance support: Feet supported, No upper extremity supported Sitting balance-Leahy Scale: Fair     Standing balance support: Bilateral upper extremity supported, During functional activity Standing balance-Leahy Scale: Poor Standing balance comment: Eva walker and min assist for static standing                            Cognition Arousal: Alert Behavior During Therapy: Flat affect Overall Cognitive Status: Impaired/Different from baseline Area of Impairment: Safety/judgement, Problem solving, Memory                     Memory: Decreased recall of precautions   Safety/Judgement: Decreased awareness of safety, Decreased awareness of deficits   Problem Solving: Slow processing, Requires verbal cues          Exercises      General Comments General comments (skin integrity, edema, etc.): VSS on RA      Pertinent Vitals/Pain Pain Assessment Pain Assessment:  Faces Faces Pain Scale: Hurts little more Pain Location: back Pain Descriptors / Indicators: Grimacing Pain Intervention(s): Repositioned, Monitored during session    Home Living                          Prior Function            PT Goals (current goals can now be found in the care plan section) Acute Rehab PT Goals Patient Stated Goal: not stated Progress towards PT goals: Progressing toward goals    Frequency    Min 1X/week      PT Plan Current plan remains appropriate     Co-evaluation              AM-PAC PT "6 Clicks" Mobility   Outcome Measure  Help needed turning from your back to your side while in a flat bed without using bedrails?: A Lot Help needed moving from lying on your back to sitting on the side of a flat bed without using bedrails?: A Lot Help needed moving to and from a bed to a chair (including a wheelchair)?: A Lot Help needed standing up from a chair using your arms (e.g., wheelchair or bedside chair)?: Total Help needed to walk in hospital room?: A Little Help needed climbing 3-5 steps with a railing? : Total 6 Click Score: 11    End of Session   Activity Tolerance: Patient tolerated treatment well Patient left: in chair;with call bell/phone within reach Nurse Communication: Mobility status PT Visit Diagnosis: Other abnormalities of gait and mobility (R26.89);Difficulty in walking, not elsewhere classified (R26.2);Hemiplegia and hemiparesis Hemiplegia - Right/Left: Left Hemiplegia - dominant/non-dominant: Non-dominant Hemiplegia - caused by: Cerebral infarction     Time: 9528-4132 PT Time Calculation (min) (ACUTE ONLY): 21 min  Charges:    $Gait Training: 8-22 mins PT General Charges $$ ACUTE PT VISIT: 1 Visit                     [facility] PT Acute Rehabilitation Services Office [phone]    [name] [facility] 09/18/2022, 2:06 PM

## 2022-09-18 NOTE — Plan of Care (Signed)
  Problem: Education: Goal: Understanding of CV disease, CV risk reduction, and recovery process will improve Outcome: Progressing Goal: Individualized Educational Video(s) Outcome: Progressing   Problem: Cardiovascular: Goal: Ability to achieve and maintain adequate cardiovascular perfusion will improve Outcome: Progressing   Problem: Education: Goal: Knowledge of General Education information will improve Description: Including pain rating scale, medication(s)/side effects and non-pharmacologic comfort measures Outcome: Progressing   Problem: Health Behavior/Discharge Planning: Goal: Ability to manage health-related needs will improve Outcome: Progressing   Problem: Clinical Measurements: Goal: Ability to maintain clinical measurements within normal limits will improve Outcome: Progressing Goal: Will remain free from infection Outcome: Progressing Goal: Diagnostic test results will improve Outcome: Progressing Goal: Respiratory complications will improve Outcome: Progressing Goal: Cardiovascular complication will be avoided Outcome: Progressing   Problem: Activity: Goal: Risk for activity intolerance will decrease Outcome: Progressing   Problem: Nutrition: Goal: Adequate nutrition will be maintained Outcome: Progressing   Problem: Coping: Goal: Level of anxiety will decrease Outcome: Progressing   Problem: Elimination: Goal: Will not experience complications related to urinary retention Outcome: Progressing   Problem: Pain Managment: Goal: General experience of comfort will improve Outcome: Progressing   Problem: Safety: Goal: Ability to remain free from injury will improve Outcome: Progressing   Problem: Skin Integrity: Goal: Risk for impaired skin integrity will decrease Outcome: Progressing

## 2022-09-18 NOTE — Progress Notes (Signed)
Patient ID: Charles Holmes, male   DOB: February 18, 1965, 57 y.o.   MRN: [id number]     Advanced Heart Failure Rounding Note  PCP-Cardiologist: Norman Herrlich, MD  [facility]: Dr. Gala Romney   Subjective:    7/25: Admit with cardiogenic shock. Started milrinone and NE. 8/6 S/P 13 teeth extractions  8/9 IABP placed 8/13 S/p HM III LVAD implant + clipping left atrial appendage d/t severe thickening and invagination of mitral valve annulus impeding flows. Apical core pathology consistent with nonischemic cardiomyopathy, did not confirm cardiac sarcoid 8/14 Left-sided hemiplegia. CT head with acute R MCA infarct. IR for thrombectomy. CT head with small to moderate area of hemorrhagic conversion 8/15 extubated  8/17 Low dose heparin gtt restarted, CT head with decreased size of hemorrhagic stroke.  8/18 Neurology signed off 8/19: Limited echo: Mod RV dysfunction, better mitral inflow into the LV and VAD position satisfactory. 8/26: Echo showed moderate-severe RV dysfunction but RV not enlarged.  Interventricular septum does bow towards towards the right. Speed increased to 5400 rpm, flow rose to 4.9 L/min.  Interventricular septum remained stable bowing slightly towards the right.    8/27: RHC with RA mean 5, PA 30/7, mean PCWP 4, CI 3.49, PAPi 4.6  Post Op Day #19  On midodrine 10 tid. MAPs 70-80s CVP not hooked up   Cor-trak out. Eating well. Walking with PT. No CP or SOB.   LVAD Interrogation HM III: Speed: 5400 Flow: 4.4  PI: 4.0 Power: 4.0 VAD interrogated personally. Parameters stable.  Objective:   Weight Range: 74.2 kg Body mass index is 25.62 kg/m.   Vital Signs:   Temp:  [97.8 F (36.6 C)-98.7 F (37.1 C)] 97.8 F (36.6 C) (09/02 0800) Pulse Rate:  [31-104] 31 (09/02 0945) Resp:  [18-28] 19 (09/02 0945) BP: (74-104)/(55-88) 79/61 (09/02 0800) SpO2:  [83 %-97 %] 93 % (09/02 0945) Weight:  [74.2 kg] 74.2 kg (09/02 0500) Last BM Date : 09/17/22  Weight change: Filed Weights    09/16/22 0500 09/17/22 0457 09/18/22 0500  Weight: 74.8 kg 74.5 kg 74.2 kg    Intake/Output:   Intake/Output Summary (Last 24 hours) at 09/18/2022 1023 Last data filed at 09/18/2022 0800 Gross per 24 hour  Intake 500 ml  Output 1725 ml  Net -1225 ml   Physical Exam   General:  Sitting in chair. NAD.  HEENT: normal  Neck: supple. JVP not elevated.  Carotids 2+ bilat; no bruits. No lymphadenopathy or thryomegaly appreciated. Cor: LVAD hum.  Lungs: Clear. Abdomen: soft, nontender, non-distended. No hepatosplenomegaly. No bruits or masses. Good bowel sounds. Driveline site clean. Anchor in place.  Extremities: no cyanosis, clubbing, rash. Warm no edema  Neuro: alert & oriented x 3. Weak on left   Telemetry     Sinus 80-90s Personally reviewed   Labs    CBC Recent Labs    09/17/22 0414 09/18/22 0410  WBC 9.9 11.3*  HGB 9.0* 9.3*  HCT 29.4* 30.7*  MCV 96.1 98.4  PLT 542* 519*    Basic Metabolic Panel Recent Labs    40/98/11 0414 09/18/22 0410  NA 135 137  K 4.0 3.8  CL 94* 97*  CO2 30 31  GLUCOSE 123* 80  BUN 43* 37*  CREATININE 0.90 0.98  CALCIUM 8.7* 8.7*  MG 2.3 2.3    BNP: BNP (last 3 results) Recent Labs    08/30/22 0258 09/04/22 2144 09/11/22 2252  BNP 922.3* 1,050.6* 933.0*     Imaging    No results  found.   Medications:     Scheduled Medications:  sodium chloride   Intravenous Once   aspirin  324 mg Oral Daily   atorvastatin  80 mg Oral Daily   Chlorhexidine Gluconate Cloth  6 each Topical Q0600   digoxin  0.125 mg Oral Daily   dorzolamide-timolol  1 drop Both Eyes BID   dronabinol  2.5 mg Oral BID AC   feeding supplement  237 mL Oral TID BM   feeding supplement (PROSource TF20)  60 mL Per Tube BID   fiber supplement (BANATROL TF)  60 mL Per Tube TID   Gerhardt's butt cream   Topical TID   insulin aspart  0-24 Units Subcutaneous TID WC & HS   latanoprost  1 drop Both Eyes QHS   melatonin  3 mg Oral QHS   mexiletine  250 mg  Oral BID   midodrine  10 mg Oral Q8H   mirtazapine  7.5 mg Oral QHS   mometasone-formoterol  2 puff Inhalation BID   multivitamin with minerals  1 tablet Oral Daily   mouth rinse  15 mL Mouth Rinse 4 times per day   pantoprazole  40 mg Oral Daily   potassium chloride  20 mEq Oral BID   [START ON 09/19/2022] sertraline  50 mg Oral Daily   sildenafil  20 mg Oral TID   sodium chloride flush  3 mL Intravenous Q12H   umeclidinium bromide  1 puff Inhalation Daily   warfarin  4 mg Oral ONCE-1600   Warfarin - Pharmacist Dosing Inpatient   Does not apply q1600    Infusions:  sodium chloride Stopped (09/13/22 2035)   epinephrine Stopped (09/15/22 1357)    PRN Medications: sodium chloride, acetaminophen, albuterol, dextrose, ondansetron (ZOFRAN) IV, mouth rinse, oxyCODONE, polyethylene glycol, sodium chloride, sodium chloride flush, traMADol   Patient Profile  Mr Baum is a 57 y.o. 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 Admitted with cardiogenic shock, stabilized and underwent HM3 LVAD. Post implant course c/b acute CVA.   Assessment/Plan   1.  Acute on chronic Systolic HF-->Cardiogenic Shock  - Diagnosed 11/2019. Presented with VT. LHC 70% LAD  - cMRI 12/21 concerning for sarcoid and EF 18%.  - PET 2/22 at [facility] EF 25% + active sarcoid - Echo 08/26/20 EF < 20% severely dilated LV RV mildly decreased.  - [employer] CRT-D upgrade in 06/08/21 - Echo 07/10/22: EF <20%, RV okay, mod pericardial effusion, mod MR/TR - Admitted 07/25 with cardiogenic shock. - RHC: Nonobstructive CAD, severely elevated filling pressures and low Fick CO/CI (2.7/1.4) - 08/13 HM III LVAD implant + clipping LAA d/t severe thickening and invagination of mitral valve annulus impeding flows.  - Apical core sent - focal myocyte hypertrophy, myocyte loss and chronic inflammation,  consistent with nonischemic cardiomyopathy. No mention of sarcoid - Speed  increased to 5300 on 08/14.  - Echo 8/26 mod-sev RV dysfunction. Speed increased to 5400 8/26 - RHC 8/27: low filing pressures with excellent cardiac output on EPI, PAPi 4.6 - MAPs 70-80s. Continue midodrine - Volume looks low Stop torsemide - Plan ramp echo tomorrow or Wed - Hopefully to CIR soon  2. HM-3 LVAD - VAD interrogated personally. Parameters stable. - LDH 284 - INR 2.3 Continue ASA 325. Discussed dosing with PharmD personally.  3.  Acute stroke - Hx CVA 06/24 -Admitted 06/24 w/ R MCA stroke. S/p TPA and mechanical clot extraction. No residual deficits.  Likely cardioembolic in setting of severe LV dysfunction. - Developed left sided weakness 08/14. CTA with R MCA infarct. Taken to IR for thrombectomy - Repeat CT head with small to moderate size hemorrhagic conversion.  - repeat head CT on 8/17 w/ improved hemorrhagic CVA - Back on warfarin - neurology signed off 09/03/22.  - Continue PT/OT - Continue ASA 325  - Plan CIR this week  4. 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 Continue mexiletine  - now s/p ICD.   5. CAD - LHC 12/07/19 70-% LAD, no intervention - LHC 8/24 non obstructive CAD.  - Continue statin. On aspirin for VAD. 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  - holding methotrexate w/ recent surgery  - apical core pathology not diagnostic of cardiac sarcoidosis.   7. Paroxsymal AT/AF - Currently in NSR/ST   8. AKI - suspect cardiorenal, improved w/ inotropic support - Creatinine stable now at 0.9 - follow BMP w/ diuresis    9. Iron deficiency anemia/ Post-op anemia - recent T sat 15%, scheduled for OP feraheme. Will complete inpatient  - Transfused 1 u RBCs 8/15 - Hgb 9.3 today.    9. Pulmonary  - PFTs with severe obstructive defect, response to bronchodilator. FEV1 1.04L, FEV1/FVC 48% - extubated 8/15  - Cultures NGTD.  - Stable  10. ID -  Now afebrile - completed Meropenem/vanc  - Cultures NGTD. - Incentive spirometer and flutter valve at bedside, encouraged to use  11. Depression - On Remeron - On Marinol for appetite.  - Added zoloft - Long talk about need to participate in therapy   Arvilla Meres MD 09/18/2022 10:23 AM

## 2022-09-18 NOTE — Progress Notes (Signed)
6 Days Post-Op Procedure(s) (LRB): RIGHT HEART CATH (N/A) Subjective: No specific complaints. Ate some breakfast. Resting before he ambulates.  Objective: Vital signs in last 24 hours: Temp:  [97.8 F (36.6 C)-98.7 F (37.1 C)] 97.8 F (36.6 C) (09/02 0800) Pulse Rate:  [34-100] 91 (09/02 0600) Cardiac Rhythm: Normal sinus rhythm (09/02 0400) Resp:  [18-26] 22 (09/02 0600) BP: (74-104)/(55-88) 98/82 (09/02 0600) SpO2:  [85 %-97 %] 94 % (09/02 0600) Weight:  [74.2 kg] 74.2 kg (09/02 0500)  Hemodynamic parameters for last 24 hours:    Intake/Output from previous day: 09/01 0701 - 09/02 0700 In: 622.2 [P.O.:620; I.V.:2.2] Out: 1675 [Urine:1675] Intake/Output this shift: No intake/output data recorded.  General appearance: alert and cooperative Neurologic: left sided weakness Heart: regular rate and rhythm Lungs: clear to auscultation bilaterally Extremities: no edema Wound: incision healing well.  Lab Results: Recent Labs    09/17/22 0414 09/18/22 0410  WBC 9.9 11.3*  HGB 9.0* 9.3*  HCT 29.4* 30.7*  PLT 542* 519*   BMET:  Recent Labs    09/17/22 0414 09/18/22 0410  NA 135 137  K 4.0 3.8  CL 94* 97*  CO2 30 31  GLUCOSE 123* 80  BUN 43* 37*  CREATININE 0.90 0.98  CALCIUM 8.7* 8.7*    PT/INR:  Recent Labs    09/18/22 0410  LABPROT 25.3*  INR 2.3*   ABG    Component Value Date/Time   PHART 7.365 08/31/2022 1504   HCO3 33.2 (H) 09/12/2022 1108   HCO3 31.2 (H) 09/12/2022 1108   TCO2 35 (H) 09/12/2022 1108   TCO2 33 (H) 09/12/2022 1108   ACIDBASEDEF 4.0 (H) 08/31/2022 1504   O2SAT 66.9 09/16/2022 0539   CBG (last 3)  Recent Labs    09/17/22 1551 09/17/22 2140 09/18/22 0648  GLUCAP 108* 113* 86    Assessment/Plan:  He has been hemodynamically stable with stable LVAD parameters.  INR therapeutic at 2.3.  Main goal at this time is continued oral nutrition and PT/OT.   LOS: 39 days    Alleen Borne 09/18/2022

## 2022-09-18 NOTE — Progress Notes (Addendum)
Occupational Therapy Treatment Patient Details Name: Charles Holmes MRN: [id number] DOB: [birth year] Today's Date: 09/18/2022   History of present illness Pt is 57 year old presented to [facility] on  08/10/22 for acute on chronic systolic heart failure. Pt had IABP placed on 08/25/22 for ongoing cardiogenic shock. Underwent placement of LVAD on 8/13. Developed lt sided weakness on 8/14. Rt MCA CVA. Underwent mechanical thrombectomy by IR. Extubated 8/15.  PMH - CAD, CHF, HLD, HTN, systolic HF, ICD implant, rt CVA.  LVAD placement and new stroke R MCA M1.   OT comments  Pt progressing towards goals this session, needing mod A to doff LVAD vest, min A +2 for step pivot transfer from chair >bed. Pt mod A for bed mobility to assist BLE's. Pt able to complete LUE exercise seated and at bed level, provided with fine motor handout/theraputty, NT/RN in room to fix pt's primofit, plan to review next session. Pt presenting with impairments listed below, will follow acutely. No alarming of VAD system noted at end of session. Patient will benefit from intensive inpatient follow up therapy, >3 hours/day to maximize safety/ind with ADLs/functional mobiltiy       If plan is discharge home, recommend the following:  Two people to help with walking and/or transfers;Help with stairs or ramp for entrance;Assistance with feeding;Assist for transportation;Assistance with cooking/housework;A lot of help with bathing/dressing/bathroom   Equipment Recommendations  Other (comment) (defer)    Recommendations for Other Services PT consult    Precautions / Restrictions Precautions Precautions: Sternal;Fall Precaution Comments: LVAD Restrictions Other Position/Activity Restrictions: Sternal Precautions       Mobility Bed Mobility Overal bed mobility: Needs Assistance       Supine to sit: Mod assist     General bed mobility comments: mod A to assist legs/line mgmt    Transfers Overall transfer level: Needs  assistance Equipment used: 2 person hand held assist Transfers: Sit to/from Stand, Bed to chair/wheelchair/BSC Sit to Stand: Min assist, +2 physical assistance     Step pivot transfers: Min assist, +2 physical assistance           Balance Overall balance assessment: Needs assistance Sitting-balance support: Feet supported, No upper extremity supported Sitting balance-Leahy Scale: Fair     Standing balance support: Bilateral upper extremity supported, During functional activity Standing balance-Leahy Scale: Poor                             ADL either performed or assessed with clinical judgement   ADL Overall ADL's : Needs assistance/impaired                 Upper Body Dressing : Moderate assistance Upper Body Dressing Details (indicate cue type and reason): doffling VAD vest     Toilet Transfer: Minimal assistance;+2 for physical assistance Toilet Transfer Details (indicate cue type and reason): 2 person HHA         Functional mobility during ADLs: Minimal assistance;+2 for physical assistance      Extremity/Trunk Assessment Upper Extremity Assessment Upper Extremity Assessment: LUE deficits/detail LUE Deficits / Details: can flex shoulder against gravity 3/5 , benefits from support at elbow to bring hand to mouth, grasp 2+/5 LUE Coordination: decreased fine motor;decreased gross motor   Lower Extremity Assessment Lower Extremity Assessment: Defer to PT evaluation        Vision       Perception Perception Perception: Not tested   Praxis Praxis Praxis: Not tested  Cognition Arousal: Alert Behavior During Therapy: Flat affect Overall Cognitive Status: Impaired/Different from baseline Area of Impairment: Safety/judgement, Problem solving, Memory                     Memory: Decreased recall of precautions   Safety/Judgement: Decreased awareness of safety, Decreased awareness of deficits   Problem Solving: Slow processing,  Requires verbal cues          Exercises Exercises: Other exercises General Exercises - Upper Extremity Shoulder Flexion: AROM, Left, 5 reps, Supine Elbow Flexion: AAROM, Left, 5 reps, Supine Elbow Extension: AAROM, Left, 5 reps, Supine Wrist Flexion: AROM, Left, 5 reps, Supine Wrist Extension: AROM, Left, 5 reps, Supine Digit Composite Flexion: AROM, Left, 5 reps, Supine Composite Extension: AROM, Left, 5 reps, Supine Other Exercises Other Exercises: LUE isolated digit extension x5 with forearm pronated Other Exercises: LUE digit adduction/abduction Other Exercises: LUE digit opposition, active assist Other Exercises: seated washcloth push to 4 corners of table with LUE x5 Other Exercises: hand over hand assist to simulate twisting VAD coords together/apart    Shoulder Instructions       General Comments VSS on RA    Pertinent Vitals/ Pain       Pain Assessment Pain Assessment: No/denies pain  Home Living                                          Prior Functioning/Environment              Frequency  Min 1X/week        Progress Toward Goals  OT Goals(current goals can now be found in the care plan section)  Progress towards OT goals: Progressing toward goals  Acute Rehab OT Goals Patient Stated Goal: none stated OT Goal Formulation: With patient Time For Goal Achievement: 09/28/22 Potential to Achieve Goals: Fair ADL Goals Pt Will Perform Grooming: with contact guard assist;sitting Pt Will Perform Upper Body Bathing: with min assist;sitting Pt Will Perform Upper Body Dressing: with mod assist;sitting Pt Will Transfer to Toilet: with min assist;stand pivot transfer;bedside commode Additional ADL Goal #1: Tolerate sitting EOB up to 5 min with supervision to increase independince with toileting.  Plan      Co-evaluation                 AM-PAC OT "6 Clicks" Daily Activity     Outcome Measure   Help from another person eating  meals?: A Little Help from another person taking care of personal grooming?: A Little Help from another person toileting, which includes using toliet, bedpan, or urinal?: A Lot Help from another person bathing (including washing, rinsing, drying)?: A Lot Help from another person to put on and taking off regular upper body clothing?: A Lot Help from another person to put on and taking off regular lower body clothing?: A Lot 6 Click Score: 14    End of Session Equipment Utilized During Treatment: Gait belt  OT Visit Diagnosis: Unsteadiness on feet (R26.81);Other abnormalities of gait and mobility (R26.89);Muscle weakness (generalized) (M62.81)   Activity Tolerance Patient tolerated treatment well   Patient Left in bed;with call bell/phone within reach;with nursing/sitter in room   Nurse Communication Mobility status        Time: 1610-9604 OT Time Calculation (min): 40 min  Charges: OT General Charges $OT Visit: 1 Visit OT Treatments $Self Care/Home Management : 8-22  mins $Therapeutic Activity: 8-22 mins $Therapeutic Exercise: 8-22 mins  Carver Fila, OTD, OTR/L SecureChat Preferred Acute Rehab [phone]   Dalphine Handing 09/18/2022, 4:57 PM

## 2022-09-19 ENCOUNTER — Inpatient Hospital Stay (HOSPITAL_COMMUNITY): Payer: Medicare Other

## 2022-09-19 DIAGNOSIS — I5023 Acute on chronic systolic (congestive) heart failure: Secondary | ICD-10-CM | POA: Diagnosis not present

## 2022-09-19 DIAGNOSIS — I428 Other cardiomyopathies: Secondary | ICD-10-CM | POA: Diagnosis not present

## 2022-09-19 LAB — CBC
HCT: 30.7 % — ABNORMAL LOW (ref 39.0–52.0)
Hemoglobin: 9.1 g/dL — ABNORMAL LOW (ref 13.0–17.0)
MCH: 28.9 pg (ref 26.0–34.0)
MCHC: 29.6 g/dL — ABNORMAL LOW (ref 30.0–36.0)
MCV: 97.5 fL (ref 80.0–100.0)
Platelets: 488 10*3/uL — ABNORMAL HIGH (ref 150–400)
RBC: 3.15 MIL/uL — ABNORMAL LOW (ref 4.22–5.81)
RDW: 17.7 % — ABNORMAL HIGH (ref 11.5–15.5)
WBC: 11.6 10*3/uL — ABNORMAL HIGH (ref 4.0–10.5)
nRBC: 0 % (ref 0.0–0.2)

## 2022-09-19 LAB — COMPREHENSIVE METABOLIC PANEL
ALT: 71 U/L — ABNORMAL HIGH (ref 0–44)
AST: 50 U/L — ABNORMAL HIGH (ref 15–41)
Albumin: 2.5 g/dL — ABNORMAL LOW (ref 3.5–5.0)
Alkaline Phosphatase: 129 U/L — ABNORMAL HIGH (ref 38–126)
Anion gap: 9 (ref 5–15)
BUN: 30 mg/dL — ABNORMAL HIGH (ref 6–20)
CO2: 28 mmol/L (ref 22–32)
Calcium: 8.4 mg/dL — ABNORMAL LOW (ref 8.9–10.3)
Chloride: 103 mmol/L (ref 98–111)
Creatinine, Ser: 0.86 mg/dL (ref 0.61–1.24)
GFR, Estimated: >60 mL/min (ref >60)
Glucose, Bld: 91 mg/dL (ref 70–99)
Potassium: 4.4 mmol/L (ref 3.5–5.1)
Sodium: 140 mmol/L (ref 135–145)
Total Bilirubin: 0.9 mg/dL (ref 0.3–1.2)
Total Protein: 6.2 g/dL — ABNORMAL LOW (ref 6.5–8.1)

## 2022-09-19 LAB — LACTATE DEHYDROGENASE: LDH: 277 U/L — ABNORMAL HIGH (ref 98–192)

## 2022-09-19 LAB — PROTIME-INR
INR: 2.8 — ABNORMAL HIGH (ref 0.8–1.2)
Prothrombin Time: 29.9 s — ABNORMAL HIGH (ref 11.4–15.2)

## 2022-09-19 LAB — GLUCOSE, CAPILLARY
Glucose-Capillary: 124 mg/dL — ABNORMAL HIGH (ref 70–99)
Glucose-Capillary: 97 mg/dL (ref 70–99)
Glucose-Capillary: 119 mg/dL — ABNORMAL HIGH (ref 70–99)
Glucose-Capillary: 121 mg/dL — ABNORMAL HIGH (ref 70–99)

## 2022-09-19 LAB — MAGNESIUM: Magnesium: 2.1 mg/dL (ref 1.7–2.4)

## 2022-09-19 MED ORDER — FUROSEMIDE 40 MG PO TABS
40.0000 mg | ORAL_TABLET | Freq: Once | ORAL | Status: AC
  Administered 2022-09-19: 40 mg via ORAL
  Filled 2022-09-19: qty 1

## 2022-09-19 NOTE — Progress Notes (Signed)
CSW attempted to visit with patient at bedside although he was sleeping. No family at bedside at the time of visit. CSW continues to follow throughout hospitalization. Lasandra Beech, LCSW, CCSW-MCS [phone]

## 2022-09-19 NOTE — Progress Notes (Signed)
Physical Therapy Treatment Patient Details Name: Charles Holmes MRN: [id number] DOB: May 20, 1965 Today's Date: 09/19/2022   History of Present Illness Pt is 57 year old presented to [facility] on  08/10/22 for acute on chronic systolic heart failure. Pt had IABP placed on 08/25/22 for ongoing cardiogenic shock. Underwent placement of LVAD on 8/13. Developed lt sided weakness on 8/14. Rt MCA CVA. Underwent mechanical thrombectomy by IR. Extubated 8/15.  PMH - CAD, CHF, HLD, HTN, systolic HF, ICD implant, rt CVA.  LVAD placement and new stroke R MCA M1.    PT Comments  Pt continues with depressed spirits and lack of desire to work with therapy. PT set up time with RN to see patient so he could mentally prepare however pt ultimately would only stand EOB today and not ambulate like he has the past several days. Pt stating "I'm about to give up on everything. I'm not fighting with anyone anymore." When PT asked pt if that was his wishes he stated "No, I'm not giving up. I'm just tired of fighting with everyone. I know my body and no one is letting me heal at my pace."  Pt educated on importance of mobility to help heal and progress towards home and that if he wasn't going to "give up" then he needed to start "fighting" and participating with therapy to get better. Acute PT to cont to follow.    If plan is discharge home, recommend the following:     Can travel by private vehicle        Equipment Recommendations  Other (comment) (To be determined)    Recommendations for Other Services       Precautions / Restrictions Precautions Precautions: Sternal;Fall Precaution Comments: LVAD Restrictions Weight Bearing Restrictions: No RUE Weight Bearing: Non weight bearing RLE Weight Bearing: Non weight bearing Other Position/Activity Restrictions: Sternal Precautions     Mobility  Bed Mobility Overal bed mobility: Needs Assistance Bed Mobility: Supine to Sit, Sit to Supine Rolling: Min assist Sidelying to  sit: Mod assist, HOB elevated   Sit to supine: Mod assist   General bed mobility comments: mod A to assist legs/line mgmt, pt initiating laying down prematurely    Transfers Overall transfer level: Needs assistance Equipment used: 2 person hand held assist Transfers: Sit to/from Stand, Bed to chair/wheelchair/BSC Sit to Stand: Min assist, +2 physical assistance           General transfer comment: Assist to bring hips up and to assist placing LUE onto EVA walker. Verbal/tactile cues for hand placement, used rocking momentum, completed 2 sit to stands    Ambulation/Gait               General Gait Details: pt ultimately refused to ambulate stating "I just don't feel good. My abdominal muscles are sore from falling this morning." Max encouragement and education provided on the importance to continues OOB mobility and ambulation. Pt took 4 side steps to [facility] with eva walker and modA to manage walker   Stairs             Wheelchair Mobility     Tilt Bed    Modified Rankin (Stroke Patients Only) Modified Rankin (Stroke Patients Only) Pre-Morbid Rankin Score: No symptoms Modified Rankin: Moderately severe disability     Balance Overall balance assessment: Needs assistance Sitting-balance support: Feet supported, No upper extremity supported Sitting balance-Leahy Scale: Fair Sitting balance - Comments: UE support Postural control: Posterior lean (trying to lay back down) Standing balance support:  Bilateral upper extremity supported, During functional activity Standing balance-Leahy Scale: Poor Standing balance comment: Eva walker and min assist for static standing                            Cognition Arousal: Alert (but chooses to keep eyes closed/depressed spirits) Behavior During Therapy: Flat affect Overall Cognitive Status: Impaired/Different from baseline Area of Impairment: Safety/judgement, Problem solving, Memory                      Memory: Decreased recall of precautions Following Commands: Follows one step commands with increased time Safety/Judgement: Decreased awareness of safety, Decreased awareness of deficits Awareness: Emergent Problem Solving: Slow processing, Requires verbal cues General Comments: Very depressed affect. pt requiring max encouragement to participate. per RN pt tried to get up himself from the chair and had to be lowered to the floor earlier this morning.        Exercises General Exercises - Lower Extremity Ankle Circles/Pumps: AROM, Both, 10 reps, Supine Quad Sets: AROM, Both, Supine, 5 reps Long Arc Quad: AROM, Left, 10 reps, Seated Other Exercises Other Exercises: Active L shld flexion to about 45 deg from supine position, motion of pushing up toward the ceiling    General Comments General comments (skin integrity, edema, etc.): VSS on 2LO2 via Raymond      Pertinent Vitals/Pain Pain Assessment Pain Assessment: No/denies pain    Home Living                          Prior Function            PT Goals (current goals can now be found in the care plan section) Acute Rehab PT Goals Patient Stated Goal: not stated PT Goal Formulation: With patient Time For Goal Achievement: 09/29/22 Potential to Achieve Goals: Fair Progress towards PT goals: Not progressing toward goals - comment (due to depressed spirits)    Frequency    Min 1X/week      PT Plan Current plan remains appropriate    Co-evaluation              AM-PAC PT "6 Clicks" Mobility   Outcome Measure  Help needed turning from your back to your side while in a flat bed without using bedrails?: A Lot Help needed moving from lying on your back to sitting on the side of a flat bed without using bedrails?: A Lot Help needed moving to and from a bed to a chair (including a wheelchair)?: A Lot Help needed standing up from a chair using your arms (e.g., wheelchair or bedside chair)?: Total Help needed to  walk in hospital room?: Total Help needed climbing 3-5 steps with a railing? : Total 6 Click Score: 9    End of Session Equipment Utilized During Treatment: Oxygen Activity Tolerance: Patient tolerated treatment well Patient left: in chair;with call bell/phone within reach Nurse Communication: Mobility status PT Visit Diagnosis: Other abnormalities of gait and mobility (R26.89);Difficulty in walking, not elsewhere classified (R26.2);Hemiplegia and hemiparesis Hemiplegia - Right/Left: Left Hemiplegia - dominant/non-dominant: Non-dominant Hemiplegia - caused by: Cerebral infarction     Time: 8295-6213 PT Time Calculation (min) (ACUTE ONLY): 38 min  Charges:    $Therapeutic Exercise: 8-22 mins $Therapeutic Activity: 23-37 mins PT General Charges $$ ACUTE PT VISIT: 1 Visit  [name], PT, DPT Acute Rehabilitation Services Secure chat preferred Office #: [phone]    [name] 09/19/2022, 1:04 PM

## 2022-09-19 NOTE — Progress Notes (Addendum)
LVAD Coordinator Rounding Note:  Admitted 08/10/22 due to acute on chronic CHF with cardiogenic shock. Milrinone dependent. Advance therapy workup completed, and pt deemed acceptable VAD candidate. Dental extractions completed 8/6. IABP placed 08/25/22.  HM 3 LVAD implanted on 08/29/22 by Dr Donata Clay under destination therapy criteria. 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 Result negative.  7/25 Admit with cardiogenic shock. Started milrinone and NE. 8/6 S/P 13 teeth extractions  8/9 IABP placed 8/13 S/p HM III LVAD implant + clipping left atrial appendage d/t severe thickening and invagination of mitral valve annulus impeding flows  CT Head 8/14 (initial) 1. 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 ASPECTS is 7. 2. No acute hemorrhage.  CT Angio Head/Neck 1. 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 2. Core infarct of 12 cc (somewhat underestimated compared to aspects) with 90 cc of penumbra. 3. Mild atherosclerosis.  Pt taken emergently to IR for percutaneous right common carotid arteriogram with thrombectomy. Revascularization achieved. Angio-seal closure device applied to left groin- clean, dry, and intact.   CT Head 8/15 @ 0450 Unchanged extent of infarct and hemorrhage in the right MCA distribution including small volume intraventricular clot. No hydrocephalus.  CT Head 8/17 @ 0701 Interval evolution of the right MCA territory infarct with decreased intraparenchymal and intraventricular hemorrhage. No hydrocephalus or midline shift.   Pt lying in bed resting on my arrival. Denies complaints, but is minimally interactive with VAD coordinator this morning. Appears to be increasingly depressed. He is not interested in eating breakfast. Currently taking Marinol BID, Zoloft 50 mg daily, and Remeron at bedtime.   Pacing wires in place. INR 2.8 today. Per Dr Donata Clay will pull once INR lower; possibly tomorrow.    Pt  working with PT/OT/SLP. CIR consulted once medically stable. Pt unable to sit in chair for longer than 45 minutes.   VAD coordinator/nurses working with pt on changing power sources. He is unable to do this due to his left sided weakness. Modification of his power cords was made in hopes he can grip the cords better. Has practice power cords for use by pt. See below for education documentation.   Vital signs: Temp: 98.2 HR: 82 Doppler Pressure: 82 Automatic BP: 98/77 (86) O2 Sat: 99% on RA Wt: 183.6>191.1>190.9>190.2>184.8>178.8>178.1>172.4>170.6>173.2>167.9>167.1>163.1 lbs   LVAD interrogation reveals:  Speed: 5400 Flow: 4.1 Power: 3.9w PI: 4.9  Alarms: none Events: none  Hematocrit: 29 Fixed speed: 5400 Low speed limit: 5100  Drive Line:  Existing VAD dressing removed and site care performed using sterile technique. Drive line exit site cleaned with Chlora prep applicators x 2, allowed to dry, and Vashe moistened 2x2 placed around driveline then covered with dry 4x4. Exit site healed and incorporated, the velour is fully implanted at exit site. Small amount of serous drainage.Slight redness, no tenderness, foul odor or rash noted. Proximal suture remains. Drive line anchor re-applied. Pt denies fever or chills. Continue Monday/ Wednesday/Friday dressing changes.  Next dressing change due 09/20/22 by VAD coordinator or nurse champion only.       Labs:  LDH trend: 596>441>165>384>345>312>312>350>327>309>316>305>298>277  INR trend: 1.4>1.6>1.1>1.1>1.3>1.2>1>1.3>1.4>1.5>1.6>2.8  AST/ALT trend: 218/45>96/30>73/23>63/36>58/37>69/50>93/72>123/103>143/150>117/138>119/138>92/127>87/123>50/71  Total Bili trend: 4.4>4.6>5.1>1.8>1.9>1.2>1.0>1.5>1.1>0.8>0.9  WBC trend: 11.6>13.7>19.6>16.1>23>26.6>21>24.6>18.1>16.5>13.2>13.1>12.5>11.6  Anticoagulation Plan: -INR Goal: 2.0 - 2.5 -ASA Dose: 81 mg  - Coumadin dosing per pharmacy  Blood Products:  IntraOp 8/14: - 4 FFP - 2  Platelets - 2 PRBC - 1 cyro - 449 cc of cellsaver - DDAVP 20 mcg  x 1   Device: Medtronic BiV -  -Therapies: OFF  Arrythmias:   Respiratory: RA  Infection:  09/01/22>> sputum cx>> NGTD>>final 09/01/22>> blood cx>> NGTD>>final 09/01/22>> urine cx>> NGTD>> final 09/13/22>>blood cxs>>no growth 5 days; final  Renal:  8/22: BUN/CRT: 28/0.75  Adverse Events on VAD: - 08/30/22: - Developed left sided weakness this am. CTA with R MCA infarct. Taken to IR for thrombectomy   Drips:    Patient Education: Attempted to work on power source change with practice power cords with pt. With hands on assistance and frequent verbal cues pt able to unscrew power cord connection, and was able to pull power cords apart x 1 after several unsuccessful attempts. He is unable to reconnect power cords without assistance. Left extremity too weak at this time to perform power change on his own. Coban reinforced on practice power leads to assist with grip.  Plan to continue VAD education with family 9/4.   Plan/Recommendations:  Please page VAD coordinator for any alarms or VAD equipment issues. Continue MWF dressing changes by VAD coordinator or Nurse Alla Feeling.   [name] RN VAD Coordinator  Office: [phone]  24/7 Pager: [phone]

## 2022-09-19 NOTE — Progress Notes (Signed)
Nutrition Follow-up  DOCUMENTATION CODES:   Not applicable  INTERVENTION:  - DYS 2 diet.   - Consider repeat SLP eval to see if patient ready for diet advancement.  - Continue Ensure Enlive po TID, each supplement provides 350 kcal and 20 grams of protein.  - Continue Magic cup BID with meals, each supplement provides 290 kcal and 9 grams of protein  - Encourage intake at all meals and of supplements.  - Continue Marinol to stimulate appetite.   - Monitor weight trends.  NUTRITION DIAGNOSIS:   Inadequate oral intake related to catabolic illness, poor appetite as evidenced by per patient/family report. *ongoing  GOAL:   Patient will meet greater than or equal to 90% of their needs *progressing  MONITOR:   PO intake, Supplement acceptance, Labs, Weight trends  REASON FOR ASSESSMENT:   Consult LVAD Eval  ASSESSMENT:   57 yo male admitted from cardiologist office with worsening fatigue, shortness of breath and poor appetite with acute on chronic HF with significant reduced LV dysfunction (EF 20%) and preserved RV function. Pt being evaluated for possible LVAD placement. PMH includes cardiac and pulmonary sarcoidosis, CAD, GERD, HLD, HTN. Noted recent hospitalization from 6/24-6/30 this year with acute emoblic CVA.  7/25 Admitted 7/26 LVAD work-up initiated 8/06 Dental extraction (13 teeth) 8/09 IABP placed 8/13 HM3 LVAD implanted, IABP removed 8/14 Early AM-Code Stroke, CT with acute R MCA stroke with R MCA M1 occlusion requiring thrombectomy 8/15 Extubated 8/17 Repeat CT head with improved hemorrhagic CVA 8/21 Changed to 12 hour nocturnal TF 9/1 Cortrak removed  Patient laying in bed at time of visit, sleepy during conversation.   Reports he continues to not really enjoy the current diet. States he sometimes doesn't know what food items are.  Also reports his appetite remains decreased. Patient noted to have started Marinol 8/30.  Despite patient's reports, he is  documented to be consuming 50-100% of meals over the past 3 days. Also documented to be drinking 3 Ensures most days.   Stressed importance of trying to eat well to support getting to CIR.  Encouraged patient to eat something at each meal and to drinking Ensure 3x/day to aid in meeting protein needs. Patient endorsed understanding.  Per discussion with RN, patient has been very down today and frustrated he is still in the hospital. This could be impacting his intake at as well. RN to see if spiritual care can follow up with patient.   He refused his morning Ensure but accepted one this afternoon. Will need to monitor intake closely.   Admit weight: 170# Current weight: 163#  Medications reviewed and include: Marinol, Remeron, MVI, Coumadin  Labs reviewed:  -   Diet Order:   Diet Order             DIET DYS 2 Room service appropriate? Yes; Fluid consistency: Thin  Diet effective now                   EDUCATION NEEDS:  Not appropriate for education at this time  Skin:  Skin Assessment: Skin Integrity Issues: Skin Integrity Issues:: Incisions Incisions: new LVAD-driveline  Last BM:  9/1  Height:  Ht Readings from Last 1 Encounters:  08/31/22 5\' 7"  (1.702 m)   Weight:  Wt Readings from Last 1 Encounters:  09/19/22 74 kg    BMI:  Body mass index is 25.55 kg/m.  Estimated Nutritional Needs:  Kcal:  2000-2300 kcals Protein:  110-125 g Fluid:  1.8 L  Shelle Iron RD, LDN For contact information, refer to [facility].

## 2022-09-19 NOTE — Plan of Care (Signed)
  Problem: Cardiovascular: Goal: Ability to achieve and maintain adequate cardiovascular perfusion will improve Outcome: Progressing   Problem: Clinical Measurements: Goal: Ability to maintain clinical measurements within normal limits will improve Outcome: Progressing   Problem: Activity: Goal: Risk for activity intolerance will decrease Outcome: Not Progressing   Problem: Nutrition: Goal: Adequate nutrition will be maintained Outcome: Not Progressing   Problem: Coping: Goal: Level of anxiety will decrease Outcome: Not Progressing

## 2022-09-19 NOTE — Progress Notes (Addendum)
ANTICOAGULATION CONSULT NOTE  Pharmacy Consult for heparin >warfarin Indication:  LVAD  Allergies  Allergen Reactions   Amiodarone     Severe tremors   Percocet [Oxycodone-Acetaminophen] Itching    Patient Measurements: Height: 5\' 7"  (170.2 cm) Weight: 74 kg (163 lb 2.3 oz) IBW/kg (Calculated) : 66.1 Heparin Dosing Weight: 87kg  Vital Signs: Temp: 98.2 F (36.8 C) (09/03 0700) Temp Source: Oral (09/03 0700) BP: 86/72 (09/03 0810) Pulse Rate: 94 (09/03 0642)  Labs: Recent Labs    09/17/22 0414 09/17/22 0553 09/18/22 0410 09/19/22 0341  HGB 9.0*  --  9.3* 9.1*  HCT 29.4*  --  30.7* 30.7*  PLT 542*  --  519* 488*  LABPROT 24.6*  --  25.3* 29.9*  INR 2.2*  --  2.3* 2.8*  HEPARINUNFRC 0.42 <0.10*  --   --   CREATININE 0.90  --  0.98 0.86    Estimated Creatinine Clearance: 86.5 mL/min (by C-G formula based on SCr of 0.86 mg/dL).   Medical History: Past Medical History:  Diagnosis Date   CAD (coronary artery disease)    CHF (congestive heart failure) (HCC)    GERD (gastroesophageal reflux disease)    Hyperlipidemia    Hypertension    Systolic heart failure (HCC) 2021   LVEF 18%, RVEF 38% on cardiac MRI 12/19/2019. possible cardiac sarcoidosis.   Wide-complex tachycardia 2021   wears LifeVest     Assessment: 57yoM on apixaban PTA for hx AF admitted for LVAD workup. Pt s/p HM3 implant on 8/13 c/b acute CVA postop. Head CT 8/17 stable, ok to start low dose heparin per neuro, warfarin started 8/19.  INR 2.8 slightly > goal.  CBC, LDH stable, no bleeding noted.   INR bump after TF stopped and pt not eating as well > will back down on warfarin dose  Decrease asa 325>81 now with INR >2 Heparin stopped with INR > 1.8  Goal of Therapy:  INR 2-3 Monitor platelets by anticoagulation protocol: Yes   Plan:  Hold warfarin for today  Continue marinol for now  Daily INR, heparin level, CBC    Leota Sauers Pharm.D. CPP, BCPS Clinical  Pharmacist [phone] 09/19/2022 8:56 AM   Please check AMION for all [facility] phone numbers After 10:00 PM, call Main Pharmacy [phone]

## 2022-09-19 NOTE — Progress Notes (Signed)
This RN had assisted pt into chair at bedside after use of BSC. Chair wheels locked prior to pt sitting in the chair and lines/equipment removed from pt's path. Pt requiring extra time and encouragement for movement. Once in the chair, the pt began to raise himself up from the chair without informing this RN who was at computer in the room. This RN was able to go to pt's side and assist to the ground without forceful contact from any part of the pt with the floor or equipment, due to pt refusing to stand up and instead stating "I wanna sit on the floor." This RN called for assistance. Other staff assisted this RN with placing the pt back into the chair during which the pt stated "I don't want to get up from the floor. We will go at my pace." Once the pt was noted to be seated in the chair with feet elevated, chair alarm ensured to be connected and on. Pt has no complaints or injuries. Bensimhon, MD made aware. Fall risk score updated. Pt refused for family to be notified of incident.

## 2022-09-19 NOTE — Progress Notes (Signed)
Inpatient Rehabilitation Admissions Coordinator   I met at bedside with patient. I discussed the need for demonstrating increased activity with therapy throughout the day to be considered for possible CIR admit. CIR therapy begins around 8 am and would not be able to be arranged to start after 11.He discussed that he would like to receive Out Patient therapy at [facility]. I discussed that he likely would need to be stronger before discharge home with his cousin , [name]. I  encouraged him to talk with family. I have not begun Auth with [facility] medicare, for he has not yet demonstrated the ability to participate at the level needed for CIR admit. As per Dr Rosalyn Charters consult on 8/23, he needs to impove stamina with therapies to be considered.  Ottie Glazier, RN, MSN Rehab Admissions Coordinator [phone] 09/19/2022 11:49 AM

## 2022-09-19 NOTE — Plan of Care (Signed)
  Problem: Cardiovascular: Goal: Ability to achieve and maintain adequate cardiovascular perfusion will improve Outcome: Progressing   Problem: Clinical Measurements: Goal: Ability to maintain clinical measurements within normal limits will improve Outcome: Progressing Goal: Will remain free from infection Outcome: Progressing

## 2022-09-19 NOTE — Progress Notes (Addendum)
Patient ID: Charles Holmes, male   DOB: 03/02/1965, 57 y.o.   MRN: [id number]     Advanced Heart Failure Rounding Note  PCP-Cardiologist: Norman Herrlich, MD  [facility]: Dr. Gala Romney   Subjective:    7/25: Admit with cardiogenic shock. Started milrinone and NE. 8/6 S/P 13 teeth extractions  8/9 IABP placed 8/13 S/p HM III LVAD implant + clipping left atrial appendage d/t severe thickening and invagination of mitral valve annulus impeding flows. Apical core pathology consistent with nonischemic cardiomyopathy, did not confirm cardiac sarcoid 8/14 Left-sided hemiplegia. CT head with acute R MCA infarct. IR for thrombectomy. CT head with small to moderate area of hemorrhagic conversion 8/15 extubated  8/17 Low dose heparin gtt restarted, CT head with decreased size of hemorrhagic stroke.  8/18 Neurology signed off 8/19: Limited echo: Mod RV dysfunction, better mitral inflow into the LV and VAD position satisfactory. 8/26: Echo showed moderate-severe RV dysfunction but RV not enlarged.  Interventricular septum does bow towards towards the right. Speed increased to 5400 rpm, flow rose to 4.9 L/min.  Interventricular septum remained stable bowing slightly towards the right.    8/27: RHC with RA mean 5, PA 30/7, mean PCWP 4, CI 3.49, PAPi 4.6  Post Op Day #20  On midodrine 10 tid. MAPs 80s CVP 17? (Slid down in bed)  Assisted to floor this morning by bedside RN. Slid out from chair because "he felt weak." Stable in bed, resting. PT set to work with him around 10, wants to rest until then.   LVAD Interrogation HM III: Speed: 5400 Flow: 4.4  PI: 3.9 Power: 3.9 VAD interrogated personally, x1 PI event this morning. Parameters stable.  Objective:   Weight Range: 74 kg Body mass index is 25.55 kg/m.   Vital Signs:   Temp:  [98.1 F (36.7 C)-99.9 F (37.7 C)] 98.2 F (36.8 C) (09/03 0700) Pulse Rate:  [29-98] 94 (09/03 0642) Resp:  [12-26] 17 (09/03 0810) BP: (79-117)/(39-99) 86/72 (09/03  0810) SpO2:  [85 %-100 %] 99 % (09/03 0642) Weight:  [74 kg] 74 kg (09/03 0430) Last BM Date : 09/17/22  Weight change: Filed Weights   09/17/22 0457 09/18/22 0500 09/19/22 0430  Weight: 74.5 kg 74.2 kg 74 kg   Intake/Output:   Intake/Output Summary (Last 24 hours) at 09/19/2022 0835 Last data filed at 09/19/2022 0000 Gross per 24 hour  Intake 582 ml  Output 1150 ml  Net -568 ml   Physical Exam  General:  weak appearing. No resp difficulty HEENT: Normal. 2L [place] Neck: supple. JVP difficult to see. Carotids 2+ bilat; no bruits. No lymphadenopathy or thyromegaly appreciated. Cor: Mechanical heart sounds with LVAD hum present. Lungs: Clear Abdomen: soft, nontender, nondistended. No hepatosplenomegaly. No bruits or masses. Good bowel sounds. Driveline: C/D/I; securement device intact and driveline incorporated Extremities: no cyanosis, clubbing, rash, trace BLE edema. PICC RUE Neuro: alert & orientedx3, cranial nerves grossly intact. moves all 4 extremities w/o difficulty. Affect pleasant  Telemetry     NSR 80s, occasional PVCs Personally reviewed   Labs    CBC Recent Labs    09/18/22 0410 09/19/22 0341  WBC 11.3* 11.6*  HGB 9.3* 9.1*  HCT 30.7* 30.7*  MCV 98.4 97.5  PLT 519* 488*    Basic Metabolic Panel Recent Labs    78/46/96 0410 09/19/22 0341  NA 137 140  K 3.8 4.4  CL 97* 103  CO2 31 28  GLUCOSE 80 91  BUN 37* 30*  CREATININE 0.98 0.86  CALCIUM 8.7* 8.4*  MG 2.3 2.1    BNP: BNP (last 3 results) Recent Labs    09/04/22 2144 09/11/22 2252 09/18/22 2246  BNP 1,050.6* 933.0* 809.0*     Imaging    DG Chest Port 1 View  Result Date: 09/19/2022 CLINICAL DATA:  [id number].  LVAD in-situ. EXAM: PORTABLE CHEST 1 VIEW COMPARISON:  Portable chest 09/14/2022 FINDINGS: 4:47 a.m. Left chest dual lead pacing system with AID wiring is noted with stable wire insertions. Right PICC again terminates in the distal SVC. No pneumothorax. Stable partial visualization of  the patient's LVAD. Interval removal feeding tube. Stable cardiomegaly and central vascular prominence without overt edema. Small left pleural effusion. There is increased patchy opacity in the left upper to mid lung periphery concerning for pneumonia. Persistent left lower lobe consolidation or atelectasis. Remaining lungs are clear. Chronic healed rib fractures are again noted on the right. Intact median sternotomy sutures.  Stable mediastinal configuration. IMPRESSION: 1. Increased patchy opacity in the left upper to mid lung periphery concerning for pneumonia. 2. Persistent left lower lobe consolidation or atelectasis. 3. Stable cardiomegaly and central vascular prominence without overt edema. 4. Stable support apparatus except for interval removal of feeding tube. Electronically Signed   By: Almira Bar M.D.   On: 09/19/2022 07:41     Medications:     Scheduled Medications:  sodium chloride   Intravenous Once   aspirin EC  81 mg Oral Daily   atorvastatin  80 mg Oral Daily   Chlorhexidine Gluconate Cloth  6 each Topical Q0600   digoxin  0.125 mg Oral Daily   dorzolamide-timolol  1 drop Both Eyes BID   dronabinol  2.5 mg Oral BID AC   feeding supplement  237 mL Oral TID BM   feeding supplement (PROSource TF20)  60 mL Per Tube BID   fiber supplement (BANATROL TF)  60 mL Per Tube TID   Gerhardt's butt cream   Topical TID   insulin aspart  0-24 Units Subcutaneous TID WC & HS   latanoprost  1 drop Both Eyes QHS   melatonin  3 mg Oral QHS   mexiletine  250 mg Oral BID   midodrine  10 mg Oral Q8H   mirtazapine  7.5 mg Oral QHS   mometasone-formoterol  2 puff Inhalation BID   multivitamin with minerals  1 tablet Oral Daily   mouth rinse  15 mL Mouth Rinse 4 times per day   pantoprazole  40 mg Oral Daily   sertraline  50 mg Oral Daily   sildenafil  20 mg Oral TID   sodium chloride flush  3 mL Intravenous Q12H   umeclidinium bromide  1 puff Inhalation Daily   Warfarin - Pharmacist Dosing  Inpatient   Does not apply q1600    Infusions:  sodium chloride Stopped (09/13/22 2035)    PRN Medications: sodium chloride, acetaminophen, albuterol, dextrose, ondansetron (ZOFRAN) IV, mouth rinse, oxyCODONE, polyethylene glycol, sodium chloride, sodium chloride flush, traMADol   Patient Profile  Mr Ninneman is a 57 y.o. 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 Admitted with cardiogenic shock, stabilized and underwent HM3 LVAD. Post implant course c/b acute CVA.   Assessment/Plan  1.  Acute on chronic Systolic HF-->Cardiogenic Shock  - Diagnosed 11/2019. Presented with VT. LHC 70% LAD  - cMRI 12/21 concerning for sarcoid and EF 18%.  - PET 2/22 at [facility] EF 25% + active sarcoid -  Echo 08/26/20 EF < 20% severely dilated LV RV mildly decreased.  - [employer] CRT-D upgrade in 06/08/21 - Echo 07/10/22: EF <20%, RV okay, mod pericardial effusion, mod MR/TR - Admitted 07/25 with cardiogenic shock. - RHC: Nonobstructive CAD, severely elevated filling pressures and low Fick CO/CI (2.7/1.4) - 08/13 HM III LVAD implant + clipping LAA d/t severe thickening and invagination of mitral valve annulus impeding flows.  - Apical core sent - focal myocyte hypertrophy, myocyte loss and chronic inflammation,  consistent with nonischemic cardiomyopathy. No mention of sarcoid - Speed increased to 5300 on 08/14.  - Echo 8/26 mod-sev RV dysfunction. Speed increased to 5400 8/26 - RHC 8/27: low filing pressures with excellent cardiac output on EPI, PAPi 4.6 - MAPs 80s. Continue midodrine - Volume stable. Suspect CVP inaccurate as he's slid down in bed. Will give 40 PO lasix today.  - Plan ramp echo today vs tomorrow - Hopefully to CIR soon  2. HM-3 LVAD - VAD interrogated personally. Parameters stable. - LDH 277 - INR 2.8. ASA 325>81 mg daily. Discussed dosing with PharmD personally.  3.  Acute stroke - Hx CVA 06/24 -Admitted 06/24 w/ R  MCA stroke. S/p TPA and mechanical clot extraction. No residual deficits. Likely cardioembolic in setting of severe LV dysfunction. - Developed left sided weakness 08/14. CTA with R MCA infarct. Taken to IR for thrombectomy - Repeat CT head with small to moderate size hemorrhagic conversion.  - repeat head CT on 8/17 w/ improved hemorrhagic CVA - Back on warfarin - neurology signed off 09/03/22.  - Continue PT/OT - ASA decreased to 81 mg daily  - Plan CIR this week  4. 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 Continue mexiletine  - now s/p ICD.   5. CAD - LHC 12/07/19 70-% LAD, no intervention - LHC 8/24 non obstructive CAD.  - Continue statin. On aspirin for VAD. - No s/s angina  6. Cardiac sarcoid - PET 2/22 at [facility] EF 25% + active sarcoid - Has completed prednisone.  - holding methotrexate w/ recent surgery, timing to restart to be discussed with MD - apical core pathology not diagnostic of cardiac sarcoidosis.   7. Paroxsymal AT/AF - Currently in NSR/ST   8. AKI - suspect cardiorenal, improved w/ inotropic support - Creatinine stable now at 0.9 - follow BMP w/ diuresis    9. Iron deficiency anemia/ Post-op anemia - recent T sat 15%, scheduled for OP feraheme. Will complete inpatient  - Transfused 1 u RBCs 8/15 - Hgb 9.1 today.    9. Pulmonary  - PFTs with severe obstructive defect, response to bronchodilator. FEV1 1.04L, FEV1/FVC 48% - extubated 8/15  - Cultures NGTD.  - Stable  10. ID - Now afebrile - completed Meropenem/vanc  - Cultures NGTD. - Incentive spirometer and flutter valve at bedside, encouraged to use  11. Depression - On Remeron - On Marinol for appetite.  - Added zoloft - Long talk about need to participate in therapy. Today he "just feels tired."  Alen Bleacher AGACNP-BC  09/19/2022 8:35 AM  Patient seen and examined with the above-signed Advanced Practice Provider and/or Housestaff. I personally reviewed  laboratory data, imaging studies and relevant notes. I independently examined the patient and formulated the important aspects of the plan. I have edited the note to reflect any of my changes or salient points. I have personally discussed the plan with the patient and/or family.  Feels weak. Not eating  much. Frustrated. Had a mild "fall" this am where he slid to the floor but was assisted by RN.   MAPs stable.   Hgb stable.   INR 2.8  General:  NAD.  HEENT: normal  + facial droop  Neck: supple. JVP 7-8  Carotids 2+ bilat; no bruits. No lymphadenopathy or thryomegaly appreciated. Cor: LVAD hum.  Lungs: Clear. Abdomen: obese soft, nontender, non-distended. No hepatosplenomegaly. No bruits or masses. Good bowel sounds. Driveline site clean. Anchor in place.  Extremities: no cyanosis, clubbing, rash. Warm no edema  Neuro: alert & oriented x 3. Weak on left  Remains weak. Poor appetite. Continue to encourage ambulation and po intake.   MAPs improved. Can wean midodrine slowly.   VAD interrogated personally. Parameters stable.  INR 2.8 Discussed dosing with PharmD personally.  Plan ramp echo tomorrow. Can move to 2C soon  Arvilla Meres, MD  11:13 AM

## 2022-09-19 NOTE — Progress Notes (Signed)
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 Off anticoagulation initially due to mild hemorrhagic extension by f/u CT.  Patient was started on IV antibiotics postop day 6 for elevated white count and fever.  Cultures are negative.  Chest x-ray shows only mild atelectasis no significant effusion.  Sternal incision looks clearing and dry.  WBC has normalized   PharmD is slowly loading coumadin . Now therapeutic and iv heparin bridge is off.  Will pull EPWs when INR , 2.5  Right heart cath 8-27 shows low filling pressures with good cardiac output.  Weaned off epinephrine, milrinone for RV dysfunction  Patient appears depressed and intermittently he is declining PT mobilization.  Will start low-dose Remeron.Appetite enhancement with merinol.  Although L side weakness from postop stroke is improving, patient has been resistant to PT efforts and is not  physically ready for CIR yet.  LVAD INTERROGATION:  HeartMate II LVAD:  Flow 4.3  liters/min, speed 5400 rpm, power 3.6, PI 4.1.  Controller intact.   Objective:    Vital Signs:   Temp:  [97.7 F (36.5 C)-99.9 F (37.7 C)] 97.7 F (36.5 C) (09/03 1121) Pulse Rate:  [30-96] 69 (09/03 0955) Resp:  [12-26] 19 (09/03 0900) BP: (83-115)/(51-91) 98/77 (09/03  0900) SpO2:  [85 %-100 %] 97 % (09/03 0900) Weight:  [74 kg] 74 kg (09/03 0430) Last BM Date : 09/17/22 Mean arterial Pressure 88-92 mm Hg  Intake/Output:  Exam  General -alert moving L side better, speech normal, drinking Ensure and tolerating mechanical soft solid diet HEENT:-No bleeding from mouth at extraction sits Neck: supple. JVP . Carotids without bruits. No lymphadenopathy or thryomegaly appreciated. Cor: Mechanical heart sounds with LVAD hum present. Lungs: clear Abdomen: soft, nontender, nondistended. No hepatosplenomegaly. No bruits or masses. Good bowel sounds. Extremities: no cyanosis, clubbing, rash, edema Neuro: alert & orientedx3, cranial nerves grossly intact. moves all 4 extremities .Improving L arm,leg motor function.  Telemetry:paced with PPM 90/min  Labs: Basic Metabolic Panel: Recent Labs  Lab 09/15/22 0354 09/16/22 0539 09/17/22 0414 09/18/22 0410 09/19/22 0341  NA 137 137 135 137 140  K 4.2 4.4 4.0 3.8 4.4  CL 95* 94* 94* 97* 103  CO2 31 32 30 31 28   GLUCOSE 129* 126* 123* 80 91  BUN 39* 42* 43* 37* 30*  CREATININE 0.69 0.87 0.90 0.98 0.86  CALCIUM 8.7* 8.7* 8.7* 8.7* 8.4*  MG 2.3 2.3 2.3 2.3 2.1    Liver Function Tests: Recent Labs  Lab 09/15/22 0354 09/16/22 0539 09/17/22 0414 09/18/22 0410 09/19/22 0341  AST 87* 91* 69* 57* 50*  ALT 123* 126* 104* 86* 71*  ALKPHOS 156* 147* 142* 139* 129*  BILITOT 0.9 0.8 1.2 0.8 0.9  PROT 6.6 6.4* 6.2* 6.8 6.2*  ALBUMIN 2.5* 2.5* 2.4* 2.6* 2.5*   No results for input(s): "LIPASE", "AMYLASE" in the last 168 hours. No results for input(s): "AMMONIA" in the  last 168 hours.  CBC: Recent Labs  Lab 09/15/22 0354 09/16/22 0539 09/17/22 0414 09/18/22 0410 09/19/22 0341  WBC 12.5* 10.4 9.9 11.3* 11.6*  HGB 8.8* 9.2* 9.0* 9.3* 9.1*  HCT 29.2* 30.4* 29.4* 30.7* 30.7*  MCV 96.1 96.2 96.1 98.4 97.5  PLT 592* 553* 542* 519* 488*    INR: Recent Labs  Lab 09/16/22 0539 09/16/22 1200  09/17/22 0414 09/18/22 0410 09/19/22 0341  INR 1.9* 1.7* 2.2* 2.3* 2.8*    Other results: EKG:   Imaging: DG Chest Port 1 View  Result Date: 09/19/2022 CLINICAL DATA:  [id number].  LVAD in-situ. EXAM: PORTABLE CHEST 1 VIEW COMPARISON:  Portable chest 09/14/2022 FINDINGS: 4:47 a.m. Left chest dual lead pacing system with AID wiring is noted with stable wire insertions. Right PICC again terminates in the distal SVC. No pneumothorax. Stable partial visualization of the patient's LVAD. Interval removal feeding tube. Stable cardiomegaly and central vascular prominence without overt edema. Small left pleural effusion. There is increased patchy opacity in the left upper to mid lung periphery concerning for pneumonia. Persistent left lower lobe consolidation or atelectasis. Remaining lungs are clear. Chronic healed rib fractures are again noted on the right. Intact median sternotomy sutures.  Stable mediastinal configuration. IMPRESSION: 1. Increased patchy opacity in the left upper to mid lung periphery concerning for pneumonia. 2. Persistent left lower lobe consolidation or atelectasis. 3. Stable cardiomegaly and central vascular prominence without overt edema. 4. Stable support apparatus except for interval removal of feeding tube. Electronically Signed   By: Almira Bar M.D.   On: 09/19/2022 07:41     Medications:     Scheduled Medications:  sodium chloride   Intravenous Once   aspirin EC  81 mg Oral Daily   atorvastatin  80 mg Oral Daily   Chlorhexidine Gluconate Cloth  6 each Topical Q0600   digoxin  0.125 mg Oral Daily   dorzolamide-timolol  1 drop Both Eyes BID   dronabinol  2.5 mg Oral BID AC   feeding supplement  237 mL Oral TID BM   Gerhardt's butt cream   Topical TID   insulin aspart  0-24 Units Subcutaneous TID WC & HS   latanoprost  1 drop Both Eyes QHS   melatonin  3 mg Oral QHS   mexiletine  250 mg Oral BID   midodrine  10 mg Oral Q8H   mirtazapine  7.5 mg Oral QHS    mometasone-formoterol  2 puff Inhalation BID   multivitamin with minerals  1 tablet Oral Daily   mouth rinse  15 mL Mouth Rinse 4 times per day   pantoprazole  40 mg Oral Daily   sertraline  50 mg Oral Daily   sildenafil  20 mg Oral TID   sodium chloride flush  3 mL Intravenous Q12H   umeclidinium bromide  1 puff Inhalation Daily   Warfarin - Pharmacist Dosing Inpatient   Does not apply q1600    Infusions:  sodium chloride Stopped (09/13/22 2035)    PRN Medications: sodium chloride, acetaminophen, albuterol, dextrose, ondansetron (ZOFRAN) IV, mouth rinse, oxyCODONE, polyethylene glycol, sodium chloride, sodium chloride flush, traMADol   Assessment:  57 year old patient with sarcoidosis, COPD history of stroke  and nonischemic cardiomyopathy with preoperative cardiogenic shock requiring balloon pump and inotropic therapy.  Postoperative  right middle cerebral artery CVA treated with neuro- radiology intervention,   Postop RV dysfunction req inhaled NO and inotropes  now weaned off. Postop RHC normal. VAD speed 5400 rpm with stable flow >  4l/min  Left side weakness from recurrent CVA. At times  non compliant  with physical therapy, ultimate goal will be CIR.  Remove EPWs tomorrow , INT 2.8 today and will hold  coumadin dose today .  I reviewed the LVAD parameters from today, and compared the results to the patient's prior recorded data.  No programming changes were made.  The LVAD is functioning within specified parameters.  The patient performs LVAD self-test daily.  LVAD interrogation was negative for any significant power changes, alarms or PI events/speed drops.  LVAD equipment check completed and is in good working order.  Back-up equipment present.   LVAD education done on emergency procedures and precautions and reviewed exit site care.  Length of Stay: 40  Lovett Sox 09/19/2022, 1:09 PM

## 2022-09-20 ENCOUNTER — Inpatient Hospital Stay (HOSPITAL_COMMUNITY): Payer: Medicare Other

## 2022-09-20 DIAGNOSIS — I5023 Acute on chronic systolic (congestive) heart failure: Secondary | ICD-10-CM | POA: Diagnosis not present

## 2022-09-20 DIAGNOSIS — I428 Other cardiomyopathies: Secondary | ICD-10-CM | POA: Diagnosis not present

## 2022-09-20 DIAGNOSIS — Z515 Encounter for palliative care: Secondary | ICD-10-CM | POA: Diagnosis not present

## 2022-09-20 DIAGNOSIS — Z8673 Personal history of transient ischemic attack (TIA), and cerebral infarction without residual deficits: Secondary | ICD-10-CM | POA: Diagnosis not present

## 2022-09-20 DIAGNOSIS — Z95811 Presence of heart assist device: Secondary | ICD-10-CM

## 2022-09-20 LAB — COMPREHENSIVE METABOLIC PANEL
ALT: 65 U/L — ABNORMAL HIGH (ref 0–44)
AST: 46 U/L — ABNORMAL HIGH (ref 15–41)
Albumin: 2.7 g/dL — ABNORMAL LOW (ref 3.5–5.0)
Alkaline Phosphatase: 120 U/L (ref 38–126)
Anion gap: 12 (ref 5–15)
BUN: 27 mg/dL — ABNORMAL HIGH (ref 6–20)
CO2: 30 mmol/L (ref 22–32)
Calcium: 9.2 mg/dL (ref 8.9–10.3)
Chloride: 98 mmol/L (ref 98–111)
Creatinine, Ser: 0.77 mg/dL (ref 0.61–1.24)
GFR, Estimated: >60 mL/min (ref >60)
Glucose, Bld: 82 mg/dL (ref 70–99)
Potassium: 4.5 mmol/L (ref 3.5–5.1)
Sodium: 140 mmol/L (ref 135–145)
Total Bilirubin: 1 mg/dL (ref 0.3–1.2)
Total Protein: 6.6 g/dL (ref 6.5–8.1)

## 2022-09-20 LAB — ECHOCARDIOGRAM LIMITED
Est EF: <20
Height: 67 in
Weight: 2599.66 [oz_av]

## 2022-09-20 LAB — GLUCOSE, CAPILLARY
Glucose-Capillary: 83 mg/dL (ref 70–99)
Glucose-Capillary: 122 mg/dL — ABNORMAL HIGH (ref 70–99)
Glucose-Capillary: 94 mg/dL (ref 70–99)
Glucose-Capillary: 76 mg/dL (ref 70–99)

## 2022-09-20 LAB — PROTIME-INR
INR: 2.8 — ABNORMAL HIGH (ref 0.8–1.2)
Prothrombin Time: 30 s — ABNORMAL HIGH (ref 11.4–15.2)

## 2022-09-20 LAB — CBC
HCT: 30.3 % — ABNORMAL LOW (ref 39.0–52.0)
Hemoglobin: 9 g/dL — ABNORMAL LOW (ref 13.0–17.0)
MCH: 28.9 pg (ref 26.0–34.0)
MCHC: 29.7 g/dL — ABNORMAL LOW (ref 30.0–36.0)
MCV: 97.4 fL (ref 80.0–100.0)
Platelets: 456 10*3/uL — ABNORMAL HIGH (ref 150–400)
RBC: 3.11 MIL/uL — ABNORMAL LOW (ref 4.22–5.81)
RDW: 18.2 % — ABNORMAL HIGH (ref 11.5–15.5)
WBC: 10.5 10*3/uL (ref 4.0–10.5)
nRBC: 0 % (ref 0.0–0.2)

## 2022-09-20 LAB — MAGNESIUM: Magnesium: 2.1 mg/dL (ref 1.7–2.4)

## 2022-09-20 LAB — LACTATE DEHYDROGENASE: LDH: 273 U/L — ABNORMAL HIGH (ref 98–192)

## 2022-09-20 NOTE — Progress Notes (Addendum)
Occupational Therapy Treatment Patient Details Name: Charles Holmes MRN: [id number] DOB: Nov 19, 1965 Today's Date: 09/20/2022   History of present illness Pt is 57 year old presented to [facility] on  08/10/22 for acute on chronic systolic heart failure. Pt had IABP placed on 08/25/22 for ongoing cardiogenic shock. Underwent placement of LVAD on 8/13. Developed lt sided weakness on 8/14. Rt MCA CVA. Underwent mechanical thrombectomy by IR. Extubated 8/15.  PMH - CAD, CHF, HLD, HTN, systolic HF, ICD implant, rt CVA.  LVAD placement and new stroke R MCA M1.   OT comments  Pt with slow progression towards goals this session, reports feeling fatigued. Pt needing mod A for LB dressing attempts seated EOB, able to perform figure 4 with R leg crossed, and able to reach down to pull up sock once partially donned on L foot. Pt with R lateral lean after sitting EOB x5 min, attempting to return to supine. Encouraged pt to remain upright and lateral scoot to R side. Pt max A for lateral scoot at EOB and pt returning to supine with mod A. Overall min cues for sternal precautions. Pt presenting with impairments listed below, will follow acutely. Patient will benefit from intensive inpatient follow up therapy, >3 hours/day to maximize safety/ind with ADLs/functional mobility.   PF 4.3 PS 5450 PI 3.9 PP 3.9 No alarming of VAD system noted during session        If plan is discharge home, recommend the following:  Two people to help with walking and/or transfers;Help with stairs or ramp for entrance;Assistance with feeding;Assist for transportation;Assistance with cooking/housework;A lot of help with bathing/dressing/bathroom   Equipment Recommendations  Other (comment) (defer)    Recommendations for Other Services PT consult    Precautions / Restrictions Precautions Precautions: Sternal;Fall Precaution Comments: LVAD Restrictions Weight Bearing Restrictions: No Other Position/Activity Restrictions: Sternal  Precautions       Mobility Bed Mobility Overal bed mobility: Needs Assistance Bed Mobility: Supine to Sit, Sit to Supine     Supine to sit: Mod assist Sit to supine: Mod assist   General bed mobility comments: mod A for trunk elevation and to return BLE's to bed    Transfers Overall transfer level: Needs assistance                 General transfer comment: max A for lateral scoot transfer; cues to place hands on knees for precautions     Balance Overall balance assessment: Needs assistance Sitting-balance support: Feet supported, No upper extremity supported Sitting balance-Leahy Scale: Fair Sitting balance - Comments: UE support Postural control: Posterior lean, Right lateral lean (attempting to return to supine)                                 ADL either performed or assessed with clinical judgement   ADL Overall ADL's : Needs assistance/impaired                     Lower Body Dressing: Moderate assistance;Sitting/lateral leans Lower Body Dressing Details (indicate cue type and reason): donning socks, attempting figure 4                    Extremity/Trunk Assessment Upper Extremity Assessment Upper Extremity Assessment: Left hand dominant RUE Deficits / Details: Globally LUE Deficits / Details: can flex shoulder against gravity 3/5 , benefits from support at elbow to bring hand to mouth, grasp 2+/5 LUE Coordination:  decreased fine motor;decreased gross motor   Lower Extremity Assessment Lower Extremity Assessment: Defer to PT evaluation        Vision       Perception Perception Perception: Not tested   Praxis Praxis Praxis: Not tested    Cognition Arousal: Alert (closing eyes frequently) Behavior During Therapy: Flat affect Overall Cognitive Status: Impaired/Different from baseline Area of Impairment: Safety/judgement, Problem solving, Memory                     Memory: Decreased recall of  precautions Following Commands: Follows one step commands with increased time, Follows one step commands inconsistently Safety/Judgement: Decreased awareness of safety, Decreased awareness of deficits Awareness: Emergent Problem Solving: Slow processing, Requires verbal cues General Comments: flat affect, max encouragement to participate, frequently closing eyes/seemingly ignoring therapist instructions        Exercises General Exercises - Upper Extremity Shoulder Flexion: AROM, 5 reps, Left, Supine Other Exercises Other Exercises: squeeze ball x10 LUE    Shoulder Instructions       General Comments VSS see note for VAD metrics    Pertinent Vitals/ Pain       Pain Assessment Pain Assessment: No/denies pain Pain Location: back Pain Descriptors / Indicators: Grimacing Pain Intervention(s): Limited activity within patient's tolerance, Monitored during session, Repositioned  Home Living                                          Prior Functioning/Environment              Frequency  Min 1X/week        Progress Toward Goals  OT Goals(current goals can now be found in the care plan section)     Acute Rehab OT Goals Patient Stated Goal: to rest OT Goal Formulation: With patient Time For Goal Achievement: 09/28/22 Potential to Achieve Goals: Fair  Plan      Co-evaluation                 AM-PAC OT "6 Clicks" Daily Activity     Outcome Measure   Help from another person eating meals?: A Little Help from another person taking care of personal grooming?: A Little Help from another person toileting, which includes using toliet, bedpan, or urinal?: A Lot Help from another person bathing (including washing, rinsing, drying)?: A Lot Help from another person to put on and taking off regular upper body clothing?: A Lot Help from another person to put on and taking off regular lower body clothing?: A Lot 6 Click Score: 14    End of Session  Equipment Utilized During Treatment: Gait belt  OT Visit Diagnosis: Unsteadiness on feet (R26.81);Other abnormalities of gait and mobility (R26.89);Muscle weakness (generalized) (M62.81)   Activity Tolerance Patient tolerated treatment well   Patient Left in bed;with call bell/phone within reach;with bed alarm set;with nursing/sitter in room   Nurse Communication Mobility status        Time: 1610-9604 OT Time Calculation (min): 29 min  Charges: OT General Charges $OT Visit: 1 Visit OT Treatments $Self Care/Home Management : 8-22 mins $Therapeutic Activity: 8-22 mins  Carver Fila, OTD, OTR/L SecureChat Preferred Acute Rehab [phone]   Carver Fila Koonce 09/20/2022, 4:36 PM

## 2022-09-20 NOTE — Plan of Care (Signed)
  Problem: Clinical Measurements: Goal: Ability to maintain clinical measurements within normal limits will improve Outcome: Progressing Goal: Will remain free from infection Outcome: Progressing Goal: Respiratory complications will improve Outcome: Progressing Goal: Cardiovascular complication will be avoided Outcome: Progressing   

## 2022-09-20 NOTE — Progress Notes (Addendum)
ANTICOAGULATION CONSULT NOTE  Pharmacy Consult for heparin >warfarin Indication:  LVAD  Allergies  Allergen Reactions   Amiodarone     Severe tremors   Percocet [Oxycodone-Acetaminophen] Itching    Patient Measurements: Height: 5\' 7"  (170.2 cm) Weight: 73.7 kg (162 lb 7.7 oz) IBW/kg (Calculated) : 66.1 Heparin Dosing Weight: 87kg  Vital Signs: Temp: 98 F (36.7 C) (09/04 1100) Temp Source: Oral (09/04 1100) BP: 93/65 (09/04 1100) Pulse Rate: 92 (09/04 1134)  Labs: Recent Labs    09/18/22 0410 09/19/22 0341 09/20/22 0409  HGB 9.3* 9.1* 9.0*  HCT 30.7* 30.7* 30.3*  PLT 519* 488* 456*  LABPROT 25.3* 29.9* 30.0*  INR 2.3* 2.8* 2.8*  CREATININE 0.98 0.86 0.77    Estimated Creatinine Clearance: 93 mL/min (by C-G formula based on SCr of 0.77 mg/dL).   Medical History: Past Medical History:  Diagnosis Date   CAD (coronary artery disease)    CHF (congestive heart failure) (HCC)    GERD (gastroesophageal reflux disease)    Hyperlipidemia    Hypertension    Systolic heart failure (HCC) 2021   LVEF 18%, RVEF 38% on cardiac MRI 12/19/2019. possible cardiac sarcoidosis.   Wide-complex tachycardia 2021   wears LifeVest     Assessment: 57yoM on apixaban PTA for hx AF admitted for LVAD workup. Pt s/p HM3 implant on 8/13 c/b acute CVA postop. Head CT 8/17 stable, ok to start low dose heparin per neuro, warfarin started 8/19.  INR 2.8 again today despite holding dose last pm -slightly > goal.  CBC, LDH stable, no bleeding noted.   INR bump after TF stopped and pt not eating as well also ordered ensure TID watch intake  will back down on warfarin dose  Decrease asa 325>81 now with INR >2 Heparin stopped with INR > 1.8  Goal of Therapy:  INR 2-3 Monitor platelets by anticoagulation protocol: Yes   Plan:  Hold warfarin for today  Continue marinol for now  Daily INR, heparin level, CBC    Leota Sauers Pharm.D. CPP, BCPS Clinical  Pharmacist [phone] 09/20/2022 11:45 AM   Please check AMION for all [facility] phone numbers After 10:00 PM, call Main Pharmacy [phone]

## 2022-09-20 NOTE — Progress Notes (Addendum)
Patient ID: Charles Holmes, male   DOB: Nov 15, 1965, 57 y.o.   MRN: [id number]     Advanced Heart Failure Rounding Note  PCP-Cardiologist: Charles Herrlich, MD  [facility]: Dr. Gala Holmes   Subjective:    7/25: Admit with cardiogenic shock. Started milrinone and NE. 8/6 S/P 13 teeth extractions  8/9 IABP placed 8/13 S/p HM III LVAD implant + clipping left atrial appendage d/t severe thickening and invagination of mitral valve annulus impeding flows. Apical core pathology consistent with nonischemic cardiomyopathy, did not confirm cardiac sarcoid 8/14 Left-sided hemiplegia. CT head with acute R MCA infarct. IR for thrombectomy. CT head with small to moderate area of hemorrhagic conversion 8/15 extubated  8/17 Low dose heparin gtt restarted, CT head with decreased size of hemorrhagic stroke.  8/18 Neurology signed off 8/19: Limited echo: Mod RV dysfunction, better mitral inflow into the LV and VAD position satisfactory. 8/26: Echo showed moderate-severe RV dysfunction but RV not enlarged.  Interventricular septum does bow towards towards the right. Speed increased to 5400 rpm, flow rose to 4.9 L/min.  Interventricular septum remained stable bowing slightly towards the right.    8/27: RHC with RA mean 5, PA 30/7, mean PCWP 4, CI 3.49, PAPi 4.6  Post Op Day #21  On midodrine 10 tid. MAPs 70s CVP 12  In better spirits this morning, ate half of his breakfast .Plan for ramp echo today.   LVAD Interrogation HM III: Speed: 5400 Flow: 4.7  PI: 2.5 Power: 3.9 VAD interrogated personally, no PI events. Parameters stable.  Objective:   Weight Range: 73.7 kg Body mass index is 25.45 kg/m.   Vital Signs:   Temp:  [97.7 F (36.5 C)-98.5 F (36.9 C)] 98.4 F (36.9 C) (09/04 4259) Pulse Rate:  [33-95] 70 (09/04 0600) Resp:  [9-31] 9 (09/04 0600) BP: (86-109)/(67-86) 94/71 (09/04 0600) SpO2:  [96 %-99 %] 98 % (09/04 0600) Weight:  [73.7 kg] 73.7 kg (09/04 0410) Last BM Date : 09/19/22  Weight  change: Filed Weights   09/18/22 0500 09/19/22 0430 09/20/22 0410  Weight: 74.2 kg 74 kg 73.7 kg   Intake/Output:   Intake/Output Summary (Last 24 hours) at 09/20/2022 0800 Last data filed at 09/20/2022 0657 Gross per 24 hour  Intake 337 ml  Output 1020 ml  Net -683 ml   Physical Exam  General:  elderly appearing. No resp difficulty HEENT: Normal Neck: supple. JVP ~10. Carotids 2+ bilat; no bruits. No lymphadenopathy or thyromegaly appreciated. Cor: Mechanical heart sounds with LVAD hum present. Lungs: Clear Abdomen: soft, nontender, nondistended. No hepatosplenomegaly. No bruits or masses. Good bowel sounds. Driveline: C/D/I; securement device intact and driveline incorporated Extremities: no cyanosis, clubbing, rash, edema. PICC RUE Neuro: alert & orientedx3, cranial nerves grossly intact. moves all 4 extremities w/o difficulty. Affect pleasant  Telemetry     NSR 90s, occasional PVCs Personally reviewed   Labs    CBC Recent Labs    09/19/22 0341 09/20/22 0409  WBC 11.6* 10.5  HGB 9.1* 9.0*  HCT 30.7* 30.3*  MCV 97.5 97.4  PLT 488* 456*    Basic Metabolic Panel Recent Labs    56/38/75 0341 09/20/22 0409  NA 140 140  K 4.4 4.5  CL 103 98  CO2 28 30  GLUCOSE 91 82  BUN 30* 27*  CREATININE 0.86 0.77  CALCIUM 8.4* 9.2  MG 2.1 2.1    BNP: BNP (last 3 results) Recent Labs    09/04/22 2144 09/11/22 2252 09/18/22 2246  BNP 1,050.6* 933.0*  809.0*     Imaging    No results found.   Medications:     Scheduled Medications:  sodium chloride   Intravenous Once   aspirin EC  81 mg Oral Daily   atorvastatin  80 mg Oral Daily   Chlorhexidine Gluconate Cloth  6 each Topical Q0600   digoxin  0.125 mg Oral Daily   dorzolamide-timolol  1 drop Both Eyes BID   dronabinol  2.5 mg Oral BID AC   feeding supplement  237 mL Oral TID BM   Gerhardt's butt cream   Topical TID   insulin aspart  0-24 Units Subcutaneous TID WC & HS   latanoprost  1 drop Both Eyes  QHS   melatonin  3 mg Oral QHS   mexiletine  250 mg Oral BID   midodrine  10 mg Oral Q8H   mirtazapine  7.5 mg Oral QHS   mometasone-formoterol  2 puff Inhalation BID   multivitamin with minerals  1 tablet Oral Daily   mouth rinse  15 mL Mouth Rinse 4 times per day   pantoprazole  40 mg Oral Daily   sertraline  50 mg Oral Daily   sildenafil  20 mg Oral TID   sodium chloride flush  3 mL Intravenous Q12H   umeclidinium bromide  1 puff Inhalation Daily   Warfarin - Pharmacist Dosing Inpatient   Does not apply q1600    Infusions:  sodium chloride Stopped (09/13/22 2035)    PRN Medications: sodium chloride, acetaminophen, albuterol, dextrose, ondansetron (ZOFRAN) IV, mouth rinse, oxyCODONE, polyethylene glycol, sodium chloride, sodium chloride flush, traMADol   Patient Profile  Charles Holmes is a 57 y.o. 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 Admitted with cardiogenic shock, stabilized and underwent HM3 LVAD. Post implant course c/b acute CVA.   Assessment/Plan  1.  Acute on chronic Systolic HF-->Cardiogenic Shock  - Diagnosed 11/2019. Presented with VT. LHC 70% LAD  - cMRI 12/21 concerning for sarcoid and EF 18%.  - PET 2/22 at [facility] EF 25% + active sarcoid - Echo 08/26/20 EF < 20% severely dilated LV RV mildly decreased.  - [employer] CRT-D upgrade in 06/08/21 - Echo 07/10/22: EF <20%, RV okay, mod pericardial effusion, mod Charles/TR - Admitted 07/25 with cardiogenic shock. - RHC: Nonobstructive CAD, severely elevated filling pressures and low Fick CO/CI (2.7/1.4) - 08/13 HM III LVAD implant + clipping LAA d/t severe thickening and invagination of mitral valve annulus impeding flows.  - Apical core sent - focal myocyte hypertrophy, myocyte loss and chronic inflammation,  consistent with nonischemic cardiomyopathy. No mention of sarcoid - Speed increased to 5300 on 08/14.  - Echo 8/26 mod-sev RV dysfunction. Speed  increased to 5400 8/26 - RHC 8/27: low filing pressures with excellent cardiac output on EPI, PAPi 4.6 - MAPs 70s-80s. Continue midodrine - Volume stable. S/p 40 PO lasix yesterday. -1L UOP. Weight down 1lb. Hold diuretic today, plan to restart tomorrow.  - Plan ramp echo today - Hopefully to CIR soon, needs to participate more with PT  2. HM-3 LVAD - VAD interrogated personally. Parameters stable. - LDH 273 - INR 2.8. ASA 81 mg daily. Discussed dosing with PharmD personally.  3.  Acute stroke - Hx CVA 06/24 -Admitted 06/24 w/ R MCA stroke. S/p TPA and mechanical clot extraction. No residual deficits. Likely cardioembolic in setting of severe LV dysfunction. - Developed left sided weakness 08/14. CTA with R MCA infarct.  Taken to IR for thrombectomy - Repeat CT head with small to moderate size hemorrhagic conversion.  - repeat head CT on 8/17 w/ improved hemorrhagic CVA - Back on warfarin - neurology signed off 09/03/22.  - Continue PT/OT - Continue ASA 81 mg daily - Plan CIR this week  4. 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 Continue mexiletine  - now s/p ICD.   5. CAD - LHC 12/07/19 70-% LAD, no intervention - LHC 8/24 non obstructive CAD.  - Continue statin. On aspirin for VAD. 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  - holding methotrexate w/ recent surgery, timing to restart to be discussed with MD - apical core pathology not diagnostic of cardiac sarcoidosis.   7. Paroxsymal AT/AF - Currently in NSR/ST   8. AKI - suspect cardiorenal, improved w/ inotropic support - Creatinine stable now   9. Iron deficiency anemia/ Post-op anemia - recent T sat 15%, scheduled for OP feraheme. Will complete inpatient  - Transfused 1 u RBCs 8/15 - Hgb 9 today.    9. Pulmonary  - PFTs with severe obstructive defect, response to bronchodilator. FEV1 1.04L, FEV1/FVC 48% -  extubated 8/15  - Cultures NGTD.  - Stable  10. ID - Now afebrile - completed Meropenem/vanc  - Cultures NGTD. - Incentive spirometer and flutter valve at bedside, encouraged to use  11. Depression - On Remeron - On Marinol for appetite.  - Added zoloft - Long talk about need to participate in therapy.   Alen Bleacher AGACNP-BC  09/20/2022 8:00 AM  Patient seen and examined with the above-signed Advanced Practice Provider and/or Housestaff. I personally reviewed laboratory data, imaging studies and relevant notes. I independently examined the patient and formulated the important aspects of the plan. I have edited the note to reflect any of my changes or salient points. I have personally discussed the plan with the patient and/or family.  Feeling better today. More motivated to participate in rehab. Eating more. MAPs stable on midodrine 1. Volume status ok.   INR 2.8. No bleeding  General:  NAD.  HEENT: normal  + facial droop Neck: supple. JVP 7-8 Carotids 2+ bilat; no bruits. No lymphadenopathy or thryomegaly appreciated. Cor: LVAD hum.  Lungs: Clear. Abdomen: obese soft, nontender, non-distended. No hepatosplenomegaly. No bruits or masses. Good bowel sounds. Driveline site clean. Anchor in place.  Extremities: no cyanosis, clubbing, rash. Warm no edema  Neuro: alert & oriented x 3. Weak on L   Plan ramp echo today. Continue ambulation and push po intake.   INR 2.8. Discussed dosing with PharmD personally.  VAD interrogated personally. Parameters stable.  Hopefully to CIR soon   Arvilla Meres, MD  9:28 AM

## 2022-09-20 NOTE — Progress Notes (Signed)
Patient ID: Charles Holmes, male   DOB: 1965-07-30, 57 y.o.   MRN: [id number]    Progress Note from the Palliative Medicine Team at [facility]   Patient Name: Charles Holmes        Date: 09/20/2022 DOB: 1965-09-20  Age: 57 y.o. MRN#: [id number] Attending Physician: Dolores Patty, MD Primary Care Physician: [name], PA-C Admit Date: 08/10/2022   Extensive chart review has been completed prior to meeting with patient  including labs, vital signs, imaging, progress/consult notes, orders, medications and available advance directive documents.   57 y.o. 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  Full assessment and workup; status post LVAD implant on 08-29-2022.  Postop day 1 code stroke called, CT significant for acute right MCA infarct, status post thrombectomy.   This NP assessed patient at the bedside as a follow up for palliative medicine needs and emotional support. Patient recently progressed to stepdown.  Continued education and motivation that this is a positive step in improvement in his medical situation.  Education offered on the role positive attitude plays in overall health and wellness.  He understands this and continues to try to keep himself motivated to do "what I need to do to get better".     Today is a special day,  his granddaughter was born.    I spoke with cousin /Roberta later in the day she too speaks to the importance of keeping [name] positive and motivated..  I explored with her interventions that might be helpful and she tells me that Elijah Birk likes to play cards.  She is going to try to bring in some cards, to utilize for motivation and distraction.  Emotional support and encouragement for his ongoing progress.     PMT will continue to support patient and family holistically.  Education offered today regarding  the importance of continued conversation within  family/support system and the medical providers  regarding overall plan of care and treatment options,  ensuring decisions are within the context of the patients values and GOCs.  Questions and concerns addressed     Time: 25 minutes  Lorinda Creed NP  Palliative Medicine Team Team Phone # [phone] Pager [phone]

## 2022-09-20 NOTE — Plan of Care (Signed)
  Problem: Activity: Goal: Ability to return to baseline activity level will improve Outcome: Progressing   Problem: Cardiovascular: Goal: Ability to achieve and maintain adequate cardiovascular perfusion will improve Outcome: Progressing   Problem: Education: Goal: Knowledge of General Education information will improve Description: Including pain rating scale, medication(s)/side effects and non-pharmacologic comfort measures Outcome: Progressing   Problem: Clinical Measurements: Goal: Ability to maintain clinical measurements within normal limits will improve Outcome: Progressing

## 2022-09-20 NOTE — Progress Notes (Signed)
LVAD Coordinator Rounding Note:  Admitted 08/10/22 due to acute on chronic CHF with cardiogenic shock. Milrinone dependent. Advance therapy workup completed, and pt deemed acceptable VAD candidate. Dental extractions completed 8/6. IABP placed 08/25/22.  HM 3 LVAD implanted on 08/29/22 by Dr Donata Clay under destination therapy criteria. 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 Result negative.  7/25 Admit with cardiogenic shock. Started milrinone and NE. 8/6 S/P 13 teeth extractions  8/9 IABP placed 8/13 S/p HM III LVAD implant + clipping left atrial appendage d/t severe thickening and invagination of mitral valve annulus impeding flows  CT Head 8/14 (initial) 1. 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 ASPECTS is 7. 2. No acute hemorrhage.  CT Angio Head/Neck 1. 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 2. Core infarct of 12 cc (somewhat underestimated compared to aspects) with 90 cc of penumbra. 3. Mild atherosclerosis.  Pt taken emergently to IR for percutaneous right common carotid arteriogram with thrombectomy. Revascularization achieved. Angio-seal closure device applied to left groin- clean, dry, and intact.   CT Head 8/15 @ 0450 Unchanged extent of infarct and hemorrhage in the right MCA distribution including small volume intraventricular clot. No hydrocephalus.  CT Head 8/17 @ 0701 Interval evolution of the right MCA territory infarct with decreased intraparenchymal and intraventricular hemorrhage. No hydrocephalus or midline shift.   Pt lying in bed resting on my arrival. Denies complaints. He is excited about the birth of his granddaughter today.   Pacing wires in place. INR 2.8 today. Per Dr Donata Clay will pull once INR <2.5.  Pt working with PT/OT/SLP. CIR consulted once medically stable. Pt unable to sit in chair for longer than 45 minutes.   VAD coordinator/nurses working with pt on changing power  sources. He is unable to do this due to his left sided weakness. Modification of his power cords was made in hopes he can grip the cords better. Has practice power cords for use by pt. See below for education documentation.   Vital signs: Temp: 98.4 HR: 87 Doppler Pressure: 82 Automatic BP: 77/50 (59) O2 Sat: 95% on RA Wt: 183.6>191.1>190.9>190.2>184.8>178.8>178.1>172.4>170.6>173.2>167.9>167.1>163.1>162.5 lbs   LVAD interrogation reveals:  Speed: 5400 Flow: 4.5 Power: 4.0 w PI: 3.2  Alarms: none Events: none  Hematocrit: 30 Fixed speed: 5400 Low speed limit: 5100  Drive Line: Pt's [name] performed dressing change with VAD coordinator supervision and verbal cues. Existing VAD dressing removed and site care performed using sterile technique. Drive line exit site cleaned with Chlora prep applicators x 2, allowed to dry, and Vashe moistened 2x2 placed around driveline then covered with dry 4x4. Exit site healed and incorporated, the velour is fully implanted at exit site. Small amount of serous drainage.Slight redness, no tenderness, foul odor or rash noted. Proximal suture remains. Drive line anchor re-applied. Pt denies fever or chills. Continue Monday/ Wednesday/Friday dressing changes.  Next dressing change due 09/22/22 by VAD coordinator or nurse champion only.       Labs:  LDH trend: 596>441>165>384>345>312>312>350>327>309>316>305>298>277>273  INR trend: 1.4>1.6>1.1>1.1>1.3>1.2>1>1.3>1.4>1.5>1.6>2.8>2.8  AST/ALT trend: 218/45>96/30>73/23>63/36>58/37>69/50>93/72>123/103>143/150>117/138>119/138>92/127>87/123>50/71>46/65  Total Bili trend: 4.4>4.6>5.1>1.8>1.9>1.2>1.0>1.5>1.1>0.8>0.9>1.0  WBC trend: 11.6>13.7>19.6>16.1>23>26.6>21>24.6>18.1>16.5>13.2>13.1>12.5>11.6>10.5  Anticoagulation Plan: -INR Goal: 2.0 - 2.5 -ASA Dose: 81 mg  - Coumadin dosing per pharmacy  Blood Products:  IntraOp 8/14: - 4 FFP - 2 Platelets - 2 PRBC - 1 cyro - 449 cc of cellsaver -  DDAVP 20 mcg x 1   Device: Medtronic BiV -  -Therapies: OFF  Arrythmias:  Respiratory: RA  Infection:  09/01/22>> sputum cx>> NGTD>>final 09/01/22>> blood cx>> NGTD>>final 09/01/22>> urine cx>> NGTD>> final 09/13/22>>blood cxs>>no growth 5 days; final  Renal:  8/22: BUN/CRT: 28/0.75  Adverse Events on VAD: - 08/30/22: - Developed left sided weakness this am. CTA with R MCA infarct. Taken to IR for thrombectomy   Drips:    Patient Education: Attempted to work on power source change with practice power cords with pt. With hands on assistance and frequent verbal cues pt able to unscrew power cord connection, and was able to pull power cords apart x 1 after several unsuccessful attempts. He is unable to reconnect power cords without assistance. Left extremity too weak at this time to perform power change on his own. Coban reinforced on practice power leads to assist with grip.  Discharge teaching completed with pt's [name]. See separate note for details.  VAD coordinator observed [name] perform dressing change today. Will plan for Roberta to change dressing again on Friday with VAD coordinator supervision.   Plan/Recommendations:  Please page VAD coordinator for any alarms or VAD equipment issues. Continue MWF dressing changes by VAD coordinator or Nurse Alla Feeling.   [name] RN VAD Coordinator  Office: [phone]  24/7 Pager: [phone]

## 2022-09-20 NOTE — Progress Notes (Signed)
VAD Discharge Teaching Note:  Discharge VAD teaching completed with    The home inspection checklist has been reviewed and no unsafe conditions have been identified. Family reports that there are at least two dedicated grounded, 3-prong outlets with clearly labeled circuit breaker has been established in the bedroom for power module and Magazine features editor.   Both patient and caregiver have been trained on the following:  1. HM III LVAD overview of system operations  2. Overview of major lifestyle accommodations and cautions   3. Overview of system components (features and functions) 4. Changing power sources 5. Overview of alerts and alarms 6. How to identify and manage an emergency including when pump is running and when pump has stopped  7. Changing system controller 8. Maintain emergency contact list and medications  The patient and caregiver have successfully demonstrated:  1. Changing power source (from batteries to mobile power unit, mobile power unit to batteries, and replacing batteries) 2. Perform system controller self test  3. Check and charge batteries  4. Change system controller 5. Paged VAD pager and programmed number in phones  A daily flow sheet with patient  weight, temperature,  flow, speed, power, and PI, along with daily self checks on system controller and power module have been performed by patient and caregiver during hospitalization and will also be done daily at home.   The caregiver has been trained on percutaneous lead exit site care, care of the driveline and dressing changes. She has performed dressing changes during patient's hospitalization under my supervision with the support of the nursing staff. The importance of lead immobilization has been stressed to patient and caregiver using the attachment device. The caregiver has successfully demonstrated the following: 1. Cleansing site with sterile technique 2. Dressing care and maintenance  3. Immobilizing  driveline  The following routine activities and maintenance have been reviewed with patient and caregiver and both verbalize understanding:  1. Stressed importance of never disconnecting power from both controller power leads at the same time, and never disconnecting both batteries at the same time, or the pump will alarm and eventually stop if no power supply restored.  2. Plug the mobile power unit (MPU) and the universal battery charger (UBC) into properly grounded (3 prong) outlets dedicated to PM use. Do NOT use adapter (cheater plug) for ungrounded outlets or multiple portable socket outlets (power strips) 3. Do not connect the PM or MPU to an outlet controlled by wall switch or the device may not work 4. Transfer from MPU to batteries during [facility] mains power failure. The PM has internal backup battery that will power the pump while you transfer to batteries 5. Keep a backup system controller, charged batteries, battery clips, and flashlight near you during sleep in case of electrical power outage 6. Clean battery, battery clip, and universal battery charger contacts weekly 7. Visually inspect percutaneous lead daily 8. Check cables and connectors when changing power source  9. Rotate batteries; keep all eight batteries charged 10. Always have backup system controller, battery clips, fully charged batteries, and spare fully charged batteries when traveling 11. Re-calibrate batteries every 70 uses; monitor battery life of 36 months or 360 uses; replace batteries at end of battery life   Identified the following changes in activities of daily living with pump:  1. No driving for at least six weeks and then only if doctor gives permission to do so 2. No tub baths while pump implanted, and shower only if doctor gives permission 3. No  swimming or submersion in water while implanted with pump 4. Keep all VAD equipment away from water or moisture 5. Keep all VAD connections clean and dry 6. No  contact sports or engage in jumping activities 7. Never have an MRI while implanted with the pump 8. Never leave or store batteries in extremely hot or cold places (such as   trunk of your car), or the battery life will be shortened 9. Call the doctor or hospital contact person if any change in how the pump sounds, feels, or works 10. Plan to sleep only when connected to the mobile power unit. Marland Kitchen 11. Keep a backup system controller, charged batteries, battery clips, and flashlight near you during sleep in case of electrical power outage 12. Do not sleep on your stomach 13. Talk with doctor before any long distance travel plans 14. Patient will need antibiotics prior to any dental procedure; instructed to contact VAD coordinator before any dental procedures (including routine cleaning)   Discharge binder given to patient and include the following: 1. List of emergency contacts 2. Wallet card 3. HM III Luggage tags 4. HM III Alarms for Patients and their Caregivers 5. HM III Patient Handbook 6. HM III Patient Education Program DVD 7. Daily diary sheets 8. Warfarin teaching sheets 9. Nosebleed teaching sheets 10. Medications you may and may not take with CHF list  Discharge equipment includes:  1. Two system controllers 2. One mobile power unit with attached 21 ' patient cable 3. One universal Magazine features editor (UBC) 4. Eight fully charged batteries  5. Four battery clips 6. One travel case 7. One holster vest 8. Wearable accessory package 9. Daily dressing kits and anchors  Following notification process completed with: [facility] [employer]  [name] [place], PCP   Discussed frequency and importance of INR checks; emphasized importance of maintaining INR goal to prevent clotting and or bleeding issues with pump.  Patient/caregiver able to answer questions and asked good questions pertaining to warfarin and diet/lifestyle changes necessary to be successful  and safe.  Patient will have INR managed by VAD Clinic; current INR goal is 2.0 - 2.5.    The patient has completed a proficiency test for the HM III and all questions have been answered. The pt and family have been instructed to call if any questions, problems, or concerns arise. Pt and caregiver successfully paged VAD coordinator using VAD pager emergency number and have been instructed to use this number only for emergencies. Patient and caregiver asked appropriate questions, had good interaction with VAD coordinator, and verbalized understanding of above instructions.   Pt discharging to Roberta's house per original plan. His caregivers plan to stay with him for the first 2 weeks, and as needed after initial 2- week period.  Caregiver will plan to change drive line dressing daily. Pt verbalized agreement with this plan.   Total elapsed time: 4.5 hours  [name] RN VAD Coordinator  Office: [phone]  24/7 Pager: [phone]

## 2022-09-20 NOTE — Progress Notes (Signed)
  Echocardiogram 2D Echocardiogram has been performed.  Delcie Roch 09/20/2022, 10:16 AM

## 2022-09-20 NOTE — Progress Notes (Signed)
Speed  Flow  PI  Power  LVIDD  AI  Aortic opening MR  TR  Septum  RV  VTI (>18cm)  5400 4.4 3.8 3.9 5.1 mild 1/5 trivial  none  mild Slight Bow left moderate  5.93  5300  4.3 3.9 3.8   1/5 trivial                                                                Doppler MAP:  Auto cuff BP: 109/88 (96)   Ramp ECHO performed at bedside per   At completion of ramp study, patients primary controller programmed:  Fixed speed: 5400 Low speed limit: 5100   [name] RN VAD Coordinator  Office: [phone]  24/7 Pager: [phone]

## 2022-09-20 NOTE — Progress Notes (Signed)
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 Off anticoagulation initially due to mild hemorrhagic extension by f/u CT.  Patient has had stable hemodynamics for several days off drips.  HeartMate 3 parameters have been excellent with VAD flow 4.4 L/min. Echocardiogram with ramp study today shows improved RV function, inflow cannula in good position, no problems with LV filling. Continue speed at 5400 RPM.   PharmD is slowly loading coumadin . Now therapeutic and iv heparin bridge is off.  Will pull EPWs when INR < 2.5  Right heart cath 8-27 shows low filling pressures with good cardiac output.  Weaned off epinephrine, milrinone for RV dysfunction  Patient appears depressed and intermittently he is declining PT mobilization.  Will start low-dose Remeron.Appetite enhancement with merinol.  Although L side weakness from postop stroke is improving, patient has been resistant to PT efforts and is not  physically ready for CIR yet. Patient has been transferred to progressive care on postop day 22  LVAD INTERROGATION:  HeartMate II LVAD:  Flow 4.3  liters/min, speed 5400 rpm, power 3.6, PI 4.1.  Controller intact.   Objective:    Vital Signs:   Temp:  [97.7 F (36.5 C)-98.5 F (36.9 C)] 98.4 F (36.9 C) (09/04  1610) Pulse Rate:  [33-95] 70 (09/04 0600) Resp:  [9-31] 9 (09/04 0600) BP: (87-109)/(67-86) 94/71 (09/04 0600) SpO2:  [96 %-99 %] 98 % (09/04 0600) Weight:  [73.7 kg] 73.7 kg (09/04 0410) Last BM Date : 09/19/22 Mean arterial Pressure 88-92 mm Hg  Intake/Output:  Exam  General -alert moving L side better, speech normal, drinking Ensure and tolerating mechanical soft solid diet HEENT:-No bleeding from mouth at extraction sits Neck: supple. JVP . Carotids without bruits. No lymphadenopathy or thryomegaly appreciated. Cor: Mechanical heart sounds with LVAD hum present. Lungs: clear Abdomen: soft, nontender, nondistended. No hepatosplenomegaly. No bruits or masses. Good bowel sounds. Extremities: no cyanosis, clubbing, rash, edema Neuro: alert & orientedx3, cranial nerves grossly intact. moves all 4 extremities .Improving L arm,leg motor function.  Telemetry:paced with PPM 90/min  Labs: Basic Metabolic Panel: Recent Labs  Lab 09/16/22 0539 09/17/22 0414 09/18/22 0410 09/19/22 0341 09/20/22 0409  NA 137 135 137 140 140  K 4.4 4.0 3.8 4.4 4.5  CL 94* 94* 97* 103 98  CO2 32 30 31 28 30   GLUCOSE 126* 123* 80 91 82  BUN 42* 43* 37* 30* 27*  CREATININE 0.87 0.90 0.98 0.86 0.77  CALCIUM 8.7* 8.7* 8.7* 8.4* 9.2  MG 2.3 2.3 2.3 2.1 2.1    Liver Function Tests: Recent Labs  Lab 09/16/22 0539 09/17/22 0414 09/18/22 0410 09/19/22 0341 09/20/22 0409  AST 91* 69* 57* 50* 46*  ALT 126* 104* 86* 71* 65*  ALKPHOS 147* 142* 139* 129* 120  BILITOT 0.8 1.2 0.8 0.9 1.0  PROT 6.4* 6.2* 6.8 6.2* 6.6  ALBUMIN 2.5* 2.4* 2.6* 2.5* 2.7*   No  results for input(s): "LIPASE", "AMYLASE" in the last 168 hours. No results for input(s): "AMMONIA" in the last 168 hours.  CBC: Recent Labs  Lab 09/16/22 0539 09/17/22 0414 09/18/22 0410 09/19/22 0341 09/20/22 0409  WBC 10.4 9.9 11.3* 11.6* 10.5  HGB 9.2* 9.0* 9.3* 9.1* 9.0*  HCT 30.4* 29.4* 30.7* 30.7* 30.3*  MCV 96.2 96.1 98.4 97.5  97.4  PLT 553* 542* 519* 488* 456*    INR: Recent Labs  Lab 09/16/22 1200 09/17/22 0414 09/18/22 0410 09/19/22 0341 09/20/22 0409  INR 1.7* 2.2* 2.3* 2.8* 2.8*    Other results: EKG:   Imaging: ECHOCARDIOGRAM LIMITED  Result Date: 09/20/2022    ECHOCARDIOGRAM LIMITED REPORT   Patient Name:   Charles Holmes Date of Exam: 09/20/2022 Medical Rec #:  [id number]       Height:       67.0 in Accession #:    [id number]      Weight:       162.5 lb Date of Birth:  Aug 30, 1965       BSA:          1.851 m Patient Age:    57 years        BP:           94/71 mmHg Patient Gender: M               HR:           86 bpm. Exam Location:  Inpatient Procedure: Limited Echo and Limited Color Doppler Indications:    RAMP study for LVAD  History:        Patient has prior history of Echocardiogram examinations, most                 recent 09/11/2022. CHF and cardiac sarcoidosis, CAD, LVAD; Risk                 Factors:Hypertension and Dyslipidemia.  Sonographer:    Delcie Roch RDCS Referring Phys: [id number] DANIEL R BENSIMHON IMPRESSIONS  1. Windows are limiting. + inflow cannula. Left ventricular ejection fraction, by estimation, is <20%. The left ventricle has severely decreased function.  2. Right ventricular systolic function is severely reduced. The right ventricular size is normal. Conclusion(s)/Recommendation(s): Unable to assess changes in LV with changes in LVAD speed. FINDINGS  Left Ventricle: Windows are limiting. + inflow cannula. Left ventricular ejection fraction, by estimation, is <20%. The left ventricle has severely decreased function. Right Ventricle: The right ventricular size is normal. Right ventricular systolic function is severely reduced. Tricuspid Valve: Tricuspid valve regurgitation is mild. Additional Comments: A device lead is visualized. Color Doppler performed.  PULMONIC VALVE PV Vmax:       0.46 m/s PV Vmean:      24.900 cm/s PV VTI:        0.059 m PV Peak grad:  0.9 mmHg PV Mean grad:  0.0  mmHg  Carolan Clines Electronically signed by Carolan Clines Signature Date/Time: 09/20/2022/10:32:40 AM    Final    DG Chest Port 1 View  Result Date: 09/19/2022 CLINICAL DATA:  [id number].  LVAD in-situ. EXAM: PORTABLE CHEST 1 VIEW COMPARISON:  Portable chest 09/14/2022 FINDINGS: 4:47 a.m. Left chest dual lead pacing system with AID wiring is noted with stable wire insertions. Right PICC again terminates in the distal SVC. No pneumothorax. Stable partial visualization of the patient's LVAD. Interval removal feeding tube. Stable cardiomegaly and central vascular prominence without overt edema. Small left pleural effusion. There is increased patchy  opacity in the left upper to mid lung periphery concerning for pneumonia. Persistent left lower lobe consolidation or atelectasis. Remaining lungs are clear. Chronic healed rib fractures are again noted on the right. Intact median sternotomy sutures.  Stable mediastinal configuration. IMPRESSION: 1. Increased patchy opacity in the left upper to mid lung periphery concerning for pneumonia. 2. Persistent left lower lobe consolidation or atelectasis. 3. Stable cardiomegaly and central vascular prominence without overt edema. 4. Stable support apparatus except for interval removal of feeding tube. Electronically Signed   By: Almira Bar M.D.   On: 09/19/2022 07:41     Medications:     Scheduled Medications:  sodium chloride   Intravenous Once   aspirin EC  81 mg Oral Daily   atorvastatin  80 mg Oral Daily   Chlorhexidine Gluconate Cloth  6 each Topical Q0600   digoxin  0.125 mg Oral Daily   dorzolamide-timolol  1 drop Both Eyes BID   dronabinol  2.5 mg Oral BID AC   feeding supplement  237 mL Oral TID BM   Gerhardt's butt cream   Topical TID   insulin aspart  0-24 Units Subcutaneous TID WC & HS   latanoprost  1 drop Both Eyes QHS   melatonin  3 mg Oral QHS   mexiletine  250 mg Oral BID   midodrine  10 mg Oral Q8H   mirtazapine  7.5 mg Oral QHS    mometasone-formoterol  2 puff Inhalation BID   multivitamin with minerals  1 tablet Oral Daily   mouth rinse  15 mL Mouth Rinse 4 times per day   pantoprazole  40 mg Oral Daily   sertraline  50 mg Oral Daily   sildenafil  20 mg Oral TID   sodium chloride flush  3 mL Intravenous Q12H   umeclidinium bromide  1 puff Inhalation Daily   Warfarin - Pharmacist Dosing Inpatient   Does not apply q1600    Infusions:  sodium chloride Stopped (09/13/22 2035)    PRN Medications: sodium chloride, acetaminophen, albuterol, dextrose, ondansetron (ZOFRAN) IV, mouth rinse, oxyCODONE, polyethylene glycol, sodium chloride, sodium chloride flush, traMADol   Assessment:  57 year old patient with sarcoidosis, COPD history of stroke  and nonischemic cardiomyopathy with preoperative cardiogenic shock requiring balloon pump and inotropic therapy.  Postoperative  right middle cerebral artery CVA treated with neuro- radiology intervention,   Postop RV dysfunction req inhaled NO and inotropes  now weaned off. Postop RHC normal. VAD speed 5400 rpm with stable flow > 4l/min  Left side weakness from recurrent CVA At times  non compliant  with physical therapy, ultimate goal will be CIR.   Plan: Transfer to progressive care Continue physical therapy in preparation for CIR Remove epicardial wires when INR less than 2.5     I reviewed the LVAD parameters from today, and compared the results to the patient's prior recorded data.  No programming changes were made.  The LVAD is functioning within specified parameters.  The patient performs LVAD self-test daily.  LVAD interrogation was negative for any significant power changes, alarms or PI events/speed drops.  LVAD equipment check completed and is in good working order.  Back-up equipment present.   LVAD education done on emergency procedures and precautions and reviewed exit site care.  Length of Stay: [address]  Charles Holmes 09/20/2022, 10:40 AM

## 2022-09-21 DIAGNOSIS — I5023 Acute on chronic systolic (congestive) heart failure: Secondary | ICD-10-CM | POA: Diagnosis not present

## 2022-09-21 DIAGNOSIS — I428 Other cardiomyopathies: Secondary | ICD-10-CM | POA: Diagnosis not present

## 2022-09-21 LAB — BASIC METABOLIC PANEL
Anion gap: 12 (ref 5–15)
BUN: 24 mg/dL — ABNORMAL HIGH (ref 6–20)
CO2: 28 mmol/L (ref 22–32)
Calcium: 8.9 mg/dL (ref 8.9–10.3)
Chloride: 96 mmol/L — ABNORMAL LOW (ref 98–111)
Creatinine, Ser: 0.76 mg/dL (ref 0.61–1.24)
GFR, Estimated: >60 mL/min (ref >60)
Glucose, Bld: 74 mg/dL (ref 70–99)
Potassium: 4.2 mmol/L (ref 3.5–5.1)
Sodium: 136 mmol/L (ref 135–145)

## 2022-09-21 LAB — LACTATE DEHYDROGENASE: LDH: 298 U/L — ABNORMAL HIGH (ref 98–192)

## 2022-09-21 LAB — PROTIME-INR
INR: 2.3 — ABNORMAL HIGH (ref 0.8–1.2)
Prothrombin Time: 25.7 s — ABNORMAL HIGH (ref 11.4–15.2)

## 2022-09-21 LAB — CBC
HCT: 31.5 % — ABNORMAL LOW (ref 39.0–52.0)
Hemoglobin: 9.3 g/dL — ABNORMAL LOW (ref 13.0–17.0)
MCH: 28.7 pg (ref 26.0–34.0)
MCHC: 29.5 g/dL — ABNORMAL LOW (ref 30.0–36.0)
MCV: 97.2 fL (ref 80.0–100.0)
Platelets: 416 10*3/uL — ABNORMAL HIGH (ref 150–400)
RBC: 3.24 MIL/uL — ABNORMAL LOW (ref 4.22–5.81)
RDW: 17.9 % — ABNORMAL HIGH (ref 11.5–15.5)
WBC: 10.8 10*3/uL — ABNORMAL HIGH (ref 4.0–10.5)
nRBC: 0 % (ref 0.0–0.2)

## 2022-09-21 LAB — GLUCOSE, CAPILLARY
Glucose-Capillary: 70 mg/dL (ref 70–99)
Glucose-Capillary: 73 mg/dL (ref 70–99)
Glucose-Capillary: 71 mg/dL (ref 70–99)
Glucose-Capillary: 92 mg/dL (ref 70–99)

## 2022-09-21 LAB — MAGNESIUM: Magnesium: 2 mg/dL (ref 1.7–2.4)

## 2022-09-21 MED ORDER — WARFARIN SODIUM 1 MG PO TABS
1.0000 mg | ORAL_TABLET | Freq: Once | ORAL | Status: AC
  Administered 2022-09-21: 1 mg via ORAL
  Filled 2022-09-21: qty 1

## 2022-09-21 NOTE — Progress Notes (Signed)
ANTICOAGULATION CONSULT NOTE  Pharmacy Consult for heparin >warfarin Indication:  LVAD  Allergies  Allergen Reactions   Amiodarone     Severe tremors   Percocet [Oxycodone-Acetaminophen] Itching    Patient Measurements: Height: 5\' 7"  (170.2 cm) Weight: 77.7 kg (171 lb 4.8 oz) IBW/kg (Calculated) : 66.1 Heparin Dosing Weight: 87kg  Vital Signs: Temp: 98.3 F (36.8 C) (09/05 1258) Temp Source: Oral (09/05 1258) BP: 100/84 (09/05 1258) Pulse Rate: 75 (09/05 0736)  Labs: Recent Labs    09/19/22 0341 09/20/22 0409 09/21/22 0446  HGB 9.1* 9.0* 9.3*  HCT 30.7* 30.3* 31.5*  PLT 488* 456* 416*  LABPROT 29.9* 30.0* 25.7*  INR 2.8* 2.8* 2.3*  CREATININE 0.86 0.77 0.76    Estimated Creatinine Clearance: 93 mL/min (by C-G formula based on SCr of 0.76 mg/dL).   Medical History: Past Medical History:  Diagnosis Date   CAD (coronary artery disease)    CHF (congestive heart failure) (HCC)    GERD (gastroesophageal reflux disease)    Hyperlipidemia    Hypertension    Systolic heart failure (HCC) 2021   LVEF 18%, RVEF 38% on cardiac MRI 12/19/2019. possible cardiac sarcoidosis.   Wide-complex tachycardia 2021   wears LifeVest     Assessment: 57yoM on apixaban PTA for hx AF admitted for LVAD workup. Pt s/p HM3 implant on 8/13 c/b acute CVA postop. Head CT 8/17 stable, ok to start low dose heparin per neuro, warfarin started 8/19.  INR down to 2.3 today - warfarin has been on hold since 9/2. Pacing wires pulled today by Dr Maren Beach. Hgb stable at 9.3, plt 416. LDH stable at 298. No s/sx of bleeding. Oral intake is still low, no ensures consumed yesterday.   Goal of Therapy:  INR 2-3 Monitor platelets by anticoagulation protocol: Yes   Plan:  Warfarin 1 mg tonight  Continue marinol for now  Daily INR, heparin level, CBC  Thank you for allowing pharmacy to participate in this patient's care,  Sherron Monday, PharmD, BCCCP Clinical Pharmacist  Phone:  [phone] 09/21/2022 1:16 PM  Please check AMION for all [facility] Pharmacy phone numbers After 10:00 PM, call Main Pharmacy [phone]

## 2022-09-21 NOTE — Plan of Care (Signed)
  Problem: Education: Goal: Understanding of CV disease, CV risk reduction, and recovery process will improve Outcome: Not Progressing Goal: Individualized Educational Video(s) Outcome: Progressing   Problem: Activity: Goal: Ability to return to baseline activity level will improve Outcome: Not Progressing   Problem: Cardiovascular: Goal: Ability to achieve and maintain adequate cardiovascular perfusion will improve Outcome: Progressing Goal: Vascular access site(s) Level 0-1 will be maintained Outcome: Progressing   Problem: Health Behavior/Discharge Planning: Goal: Ability to safely manage health-related needs after discharge will improve Outcome: Progressing   Problem: Education: Goal: Knowledge of General Education information will improve Description: Including pain rating scale, medication(s)/side effects and non-pharmacologic comfort measures Outcome: Progressing   Problem: Health Behavior/Discharge Planning: Goal: Ability to manage health-related needs will improve Outcome: Progressing   Problem: Clinical Measurements: Goal: Ability to maintain clinical measurements within normal limits will improve Outcome: Progressing Goal: Will remain free from infection Outcome: Progressing Goal: Diagnostic test results will improve Outcome: Progressing Goal: Respiratory complications will improve Outcome: Progressing Goal: Cardiovascular complication will be avoided Outcome: Progressing   Problem: Activity: Goal: Risk for activity intolerance will decrease Outcome: Not Progressing   Problem: Nutrition: Goal: Adequate nutrition will be maintained Outcome: Not Progressing   Problem: Coping: Goal: Level of anxiety will decrease Outcome: Not Progressing   Problem: Elimination: Goal: Will not experience complications related to urinary retention Outcome: Progressing   Problem: Pain Managment: Goal: General experience of comfort will improve Outcome: Progressing    Problem: Safety: Goal: Ability to remain free from injury will improve Outcome: Progressing   Problem: Skin Integrity: Goal: Risk for impaired skin integrity will decrease Outcome: Progressing

## 2022-09-21 NOTE — Plan of Care (Signed)
  Problem: Clinical Measurements: Goal: Will remain free from infection Outcome: Progressing Goal: Diagnostic test results will improve Outcome: Progressing Goal: Respiratory complications will improve Outcome: Progressing   Problem: Clinical Measurements: Goal: Will remain free from infection Outcome: Progressing Goal: Diagnostic test results will improve Outcome: Progressing Goal: Respiratory complications will improve Outcome: Progressing   Problem: Clinical Measurements: Goal: Will remain free from infection Outcome: Progressing Goal: Diagnostic test results will improve Outcome: Progressing Goal: Respiratory complications will improve Outcome: Progressing   Problem: Activity: Goal: Risk for activity intolerance will decrease Outcome: Not Progressing   Problem: Nutrition: Goal: Adequate nutrition will be maintained Outcome: Not Progressing

## 2022-09-21 NOTE — Progress Notes (Signed)
Outpatient VAD CSW attempted bedside visit although patient sleeping. CSW updated on patient status by VAD Coordinators. CSW will continue to follow throughout implant hospitalization. Lasandra Beech, LCSW, CCSW-MCS [phone]

## 2022-09-21 NOTE — Progress Notes (Signed)
This chaplain is present for F/U spiritual care. The chaplain reviewed the chart notes and received an update from the RN-Justin before the visit.  The Pt. is awake and answered the chaplain's questions about the birth of the Pt. grand-daughter. The Pt. is most responsive to the chaplain's visit when sharing pictures of his family on the Pt. phone. The Pt. tells the chaplain "my grandson and I share several similar characteristics."  Otherwise, the Pt. often turns away from the conversation, which is different from the previous visits.    The Pt. shares "he is tired and wishes he could rest" The Pt. declines the chaplain's invitation to eat lunch with him. The Pt. shares the food consistency is appropriate for chewing but he has "no appetite" and lacks any type of affirming response to understanding the connection between his nutrition, mobility, and his healing.   The chaplain is curious if the Pt. participation in PT may improve if the chaplain is present. The chaplain will attempt a second visit at this time.  Chaplain Stephanie Acre [phone]

## 2022-09-21 NOTE — Progress Notes (Signed)
LVAD Coordinator Rounding Note:  Admitted 08/10/22 due to acute on chronic CHF with cardiogenic shock. Milrinone dependent. Advance therapy workup completed, and pt deemed acceptable VAD candidate. Dental extractions completed 8/6. IABP placed 08/25/22.  HM 3 LVAD implanted on 08/29/22 by Dr Donata Clay under destination therapy criteria. 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 Result negative.  7/25 Admit with cardiogenic shock. Started milrinone and NE. 8/6 S/P 13 teeth extractions  8/9 IABP placed 8/13 S/p HM III LVAD implant + clipping left atrial appendage d/t severe thickening and invagination of mitral valve annulus impeding flows  CT Head 8/14 (initial) 1. 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 ASPECTS is 7. 2. No acute hemorrhage.  CT Angio Head/Neck 1. 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 2. Core infarct of 12 cc (somewhat underestimated compared to aspects) with 90 cc of penumbra. 3. Mild atherosclerosis.  Pt taken emergently to IR for percutaneous right common carotid arteriogram with thrombectomy. Revascularization achieved. Angio-seal closure device applied to left groin- clean, dry, and intact.   CT Head 8/15 @ 0450 Unchanged extent of infarct and hemorrhage in the right MCA distribution including small volume intraventricular clot. No hydrocephalus.  CT Head 8/17 @ 0701 Interval evolution of the right MCA territory infarct with decreased intraparenchymal and intraventricular hemorrhage. No hydrocephalus or midline shift.   Pt lying in bed resting on my arrival. Denies complaints, other than stating he is "just tired." Encouraged increased PO intake (has not eaten any of his breakfast). Encouraged him to participate with PT/OT, and to get out of bed to recliner today. He verbalized understanding.   Pacing wires in place. INR 1.8 today. Per Dr Donata Clay will pull once INR <2.5.  Pt working with  PT/OT/SLP. CIR consulted once medically stable. Pt unable to sit in chair for longer than 45 minutes. Has been refusing therapy.   VAD coordinator/nurses working with pt on changing power sources. He is unable to do this due to his left sided weakness. Modification of his power cords was made in hopes he can grip the cords better. Has practice power cords for use by pt. See below for education documentation.   Vital signs: Temp: 98.4 HR: 82 Doppler Pressure: 90 Automatic BP: 105/84 (92) O2 Sat: 97% on RA Wt: 183.6>191.1>190.9>190.2>184.8>178.8>178.1>172.4>170.6>173.2>167.9>167.1>163.1>162.5>171.3 lbs   LVAD interrogation reveals:  Speed: 5400 Flow: 4.1 Power: 3.9 w PI: 4.5  Alarms: none Events: 9 PI events  Hematocrit: 32 Fixed speed: 5400 Low speed limit: 5100  Drive Line:  Existing VAD dressing clean, dry, and intact. Drive line anchor re-applied. Continue Monday/ Wednesday/Friday dressing changes. Next dressing change due 09/22/22 by VAD coordinator or nurse champion only.    Labs:  LDH trend: 596>441>165>384>345>312>312>350>327>309>316>305>298>277>273>298  INR trend: 1.4>1.6>1.1>1.1>1.3>1.2>1>1.3>1.4>1.5>1.6>2.8>2.8>2.3  AST/ALT trend: 218/45>96/30>73/23>63/36>58/37>69/50>93/72>123/103>143/150>117/138>119/138>92/127>87/123>50/71>46/65  Total Bili trend: 4.4>4.6>5.1>1.8>1.9>1.2>1.0>1.5>1.1>0.8>0.9>1.0  WBC trend: 11.6>13.7>19.6>16.1>23>26.6>21>24.6>18.1>16.5>13.2>13.1>12.5>11.6>10.5>10.8  Anticoagulation Plan: -INR Goal: 2.0 - 2.5 -ASA Dose: 81 mg  - Coumadin dosing per pharmacy  Blood Products:  IntraOp 8/14: - 4 FFP - 2 Platelets - 2 PRBC - 1 cyro - 449 cc of cellsaver - DDAVP 20 mcg x 1   Device: Medtronic BiV -  -Therapies: OFF  Arrythmias:   Respiratory: RA  Infection:  09/01/22>> sputum cx>> NGTD>>final 09/01/22>> blood cx>> NGTD>>final 09/01/22>> urine cx>> NGTD>> final 09/13/22>>blood cxs>>no growth 5 days; final  Renal:  8/22: BUN/CRT:  28/0.75  Adverse Events on VAD: - 08/30/22: - Developed left sided weakness this am. CTA with R  MCA infarct. Taken to IR for thrombectomy   Drips:    Patient Education: Attempted to work on power source change with practice power cords with pt. Pt reluctant to participate stating "I'm just tired." VAD coordinator provided hands on assistance and frequent verbal cues. Pt able to unscrew power cord connection, but was unable to pull apart/reconnect power cords without assistance. Left extremity too weak at this time to perform power change on his own. Coban reinforced on practice power leads to assist with grip.  Discharge teaching completed with pt's [name] on 9/5. See separate note for documentation.  Will plan for Roberta to change dressing again on Friday with VAD coordinator supervision.   Plan/Recommendations:  Please page VAD coordinator for any alarms or VAD equipment issues. Continue MWF dressing changes by VAD coordinator or Nurse Alla Feeling.  [name] RN VAD Coordinator  Office: [phone]  24/7 Pager: [phone]

## 2022-09-21 NOTE — Progress Notes (Addendum)
Patient ID: Charles Holmes, male   DOB: July 05, 1965, 57 y.o.   MRN: [id number]     Advanced Heart Failure Rounding Note  PCP-Cardiologist: Norman Herrlich, MD  [facility]: Dr. Gala Romney   Subjective:    7/25: Admit with cardiogenic shock. Started milrinone and NE. 8/6 S/P 13 teeth extractions  8/9 IABP placed 8/13 S/p HM III LVAD implant + clipping left atrial appendage d/t severe thickening and invagination of mitral valve annulus impeding flows. Apical core pathology consistent with nonischemic cardiomyopathy, did not confirm cardiac sarcoid 8/14 Left-sided hemiplegia. CT head with acute R MCA infarct. IR for thrombectomy. CT head with small to moderate area of hemorrhagic conversion 8/15 extubated  8/17 Low dose heparin gtt restarted, CT head with decreased size of hemorrhagic stroke.  8/18 Neurology signed off 8/19: Limited echo: Mod RV dysfunction, better mitral inflow into the LV and VAD position satisfactory. 8/26: Echo showed moderate-severe RV dysfunction but RV not enlarged.  Interventricular septum does bow towards towards the right. Speed increased to 5400 rpm, flow rose to 4.9 L/min.  Interventricular septum remained stable bowing slightly towards the right.    8/27: RHC with RA mean 5, PA 30/7, mean PCWP 4, CI 3.49, PAPi 4.6 9/4: Ramp Echo>>Fixed speed 5400   Post Op Day #22  MAPs 80s-90s on midodrine.  INR 2.3   Laying in bed. Looks depressed. Did not participate much w/ PT yesterday. Says appetite remains poor. Not eating much. Mouth feels dry. Denies dyspnea and CP.   Granddaughter born yesterday.    LVAD Interrogation HM III: Speed: 5400 Flow: 4.3  PI: 4.1 Power: 3.9. 7 PI events. VAD interrogated personally, Parameters stable.  Objective:   Weight Range: 77.7 kg Body mass index is 26.83 kg/m.   Vital Signs:   Temp:  [98 F (36.7 C)-99.7 F (37.6 C)] 98.7 F (37.1 C) (09/05 0235) Pulse Rate:  [64-107] 78 (09/05 0235) Resp:  [16-26] 19 (09/05 0235) BP:  (77-109)/(50-90) 105/82 (09/05 0235) SpO2:  [90 %-96 %] 94 % (09/05 0235) Weight:  [77.7 kg] 77.7 kg (09/05 0432) Last BM Date : 09/19/22  Weight change: Filed Weights   09/19/22 0430 09/20/22 0410 09/21/22 0432  Weight: 74 kg 73.7 kg 77.7 kg   Intake/Output:   Intake/Output Summary (Last 24 hours) at 09/21/2022 0714 Last data filed at 09/20/2022 2300 Gross per 24 hour  Intake 240 ml  Output 500 ml  Net -260 ml    MAP 80  Physical Exam  General:  depressed appearing, laying in bed. No respiratory distress.  HEENT: Normal Neck: supple. JVP not elevated. Carotids 2+ bilat; no bruits. No lymphadenopathy or thyromegaly appreciated. Cor: Mechanical heart sounds with LVAD hum present. Lungs: Clear Abdomen: soft, nontender, nondistended. No hepatosplenomegaly. No bruits or masses. Good bowel sounds. Driveline: C/D/I; securement device intact and driveline incorporated Extremities: no cyanosis, clubbing, rash, edema.  Neuro: alert & orientedx3, cranial nerves grossly intact. moves all 4 extremities w/o difficulty. Affect pleasant  Telemetry     NSR 70s, occasional PVCs Personally reviewed   Labs    CBC Recent Labs    09/20/22 0409 09/21/22 0446  WBC 10.5 10.8*  HGB 9.0* 9.3*  HCT 30.3* 31.5*  MCV 97.4 97.2  PLT 456* 416*    Basic Metabolic Panel Recent Labs    28/41/32 0409 09/21/22 0446  NA 140 136  K 4.5 4.2  CL 98 96*  CO2 30 28  GLUCOSE 82 74  BUN 27* 24*  CREATININE 0.77 0.76  CALCIUM 9.2 8.9  MG 2.1 2.0    BNP: BNP (last 3 results) Recent Labs    09/04/22 2144 09/11/22 2252 09/18/22 2246  BNP 1,050.6* 933.0* 809.0*     Imaging    ECHOCARDIOGRAM LIMITED  Result Date: 09/20/2022    ECHOCARDIOGRAM LIMITED REPORT   Patient Name:   Charles Holmes Date of Exam: 09/20/2022 Medical Rec #:  [id number]       Height:       67.0 in Accession #:    [id number]      Weight:       162.5 lb Date of Birth:  11-28-1965       BSA:          1.851 m Patient Age:     57 years        BP:           94/71 mmHg Patient Gender: M               HR:           86 bpm. Exam Location:  Inpatient Procedure: Limited Echo and Limited Color Doppler Indications:    RAMP study for LVAD  History:        Patient has prior history of Echocardiogram examinations, most                 recent 09/11/2022. CHF and cardiac sarcoidosis, CAD, LVAD; Risk                 Factors:Hypertension and Dyslipidemia.  Sonographer:    Delcie Roch RDCS Referring Phys: [id number] Johndaniel Catlin R Dabria Wadas IMPRESSIONS  1. Windows are limiting. + inflow cannula. Left ventricular ejection fraction, by estimation, is <20%. The left ventricle has severely decreased function.  2. Right ventricular systolic function is severely reduced. The right ventricular size is normal. Conclusion(s)/Recommendation(s): Unable to assess changes in LV with changes in LVAD speed. FINDINGS  Left Ventricle: Windows are limiting. + inflow cannula. Left ventricular ejection fraction, by estimation, is <20%. The left ventricle has severely decreased function. Right Ventricle: The right ventricular size is normal. Right ventricular systolic function is severely reduced. Tricuspid Valve: Tricuspid valve regurgitation is mild. Additional Comments: A device lead is visualized. Color Doppler performed.  PULMONIC VALVE PV Vmax:       0.46 m/s PV Vmean:      24.900 cm/s PV VTI:        0.059 m PV Peak grad:  0.9 mmHg PV Mean grad:  0.0 mmHg  Halford Decamp signed by Carolan Clines Signature Date/Time: 09/20/2022/10:32:40 AM    Final      Medications:     Scheduled Medications:  sodium chloride   Intravenous Once   aspirin EC  81 mg Oral Daily   atorvastatin  80 mg Oral Daily   Chlorhexidine Gluconate Cloth  6 each Topical Q0600   digoxin  0.125 mg Oral Daily   dorzolamide-timolol  1 drop Both Eyes BID   dronabinol  2.5 mg Oral BID AC   feeding supplement  237 mL Oral TID BM   Gerhardt's butt cream   Topical TID   insulin aspart  0-24  Units Subcutaneous TID WC & HS   latanoprost  1 drop Both Eyes QHS   melatonin  3 mg Oral QHS   mexiletine  250 mg Oral BID   midodrine  10 mg Oral Q8H   mirtazapine  7.5 mg Oral QHS   mometasone-formoterol  2 puff Inhalation  BID   multivitamin with minerals  1 tablet Oral Daily   mouth rinse  15 mL Mouth Rinse 4 times per day   pantoprazole  40 mg Oral Daily   sertraline  50 mg Oral Daily   sildenafil  20 mg Oral TID   sodium chloride flush  3 mL Intravenous Q12H   umeclidinium bromide  1 puff Inhalation Daily   Warfarin - Pharmacist Dosing Inpatient   Does not apply q1600    Infusions:  sodium chloride Stopped (09/13/22 2035)    PRN Medications: sodium chloride, acetaminophen, albuterol, dextrose, ondansetron (ZOFRAN) IV, mouth rinse, oxyCODONE, polyethylene glycol, sodium chloride, sodium chloride flush, traMADol   Patient Profile  Mr Landau is a 57 y.o. 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 Admitted with cardiogenic shock, stabilized and underwent HM3 LVAD. Post implant course c/b acute CVA.   Assessment/Plan  1.  Acute on chronic Systolic HF-->Cardiogenic Shock  - Diagnosed 11/2019. Presented with VT. LHC 70% LAD  - cMRI 12/21 concerning for sarcoid and EF 18%.  - PET 2/22 at [facility] EF 25% + active sarcoid - Echo 08/26/20 EF < 20% severely dilated LV RV mildly decreased.  - Medtronic CRT-D upgrade in 06/08/21 - Echo 07/10/22: EF <20%, RV okay, mod pericardial effusion, mod MR/TR - Admitted 07/25 with cardiogenic shock. - RHC: Nonobstructive CAD, severely elevated filling pressures and low Fick CO/CI (2.7/1.4) - 08/13 HM III LVAD implant + clipping LAA d/t severe thickening and invagination of mitral valve annulus impeding flows.  - Apical core sent - focal myocyte hypertrophy, myocyte loss and chronic inflammation,  consistent with nonischemic cardiomyopathy. No mention of sarcoid - Speed increased to  5300 on 08/14.  - Echo 8/26 mod-sev RV dysfunction. Speed increased to 5400 8/26 - RHC 8/27: low filing pressures with excellent cardiac output on EPI, PAPi 4.6 - Ramp Echo 9/4, speed increased to 5400  - MAPs 80s-90s. Continue midodrine - Volume stable currently. W/ poor PO intake will hold Lasix again today  - Hopefully to CIR soon, needs to participate more with PT  2. HM-3 LVAD - VAD interrogated personally. Parameters stable. - LDH 298 - INR 2.3. ASA 81 mg daily. Discussed dosing with PharmD personally.  3.  Acute stroke - Hx CVA 06/24 -Admitted 06/24 w/ R MCA stroke. S/p TPA and mechanical clot extraction. No residual deficits. Likely cardioembolic in setting of severe LV dysfunction. - Developed left sided weakness 08/14. CTA with R MCA infarct. Taken to IR for thrombectomy - Repeat CT head with small to moderate size hemorrhagic conversion.  - repeat head CT on 8/17 w/ improved hemorrhagic CVA - Back on warfarin - neurology signed off 09/03/22.  - Continue PT/OT - Continue ASA 81 mg daily - Plan CIR this week  4. 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 Continue mexiletine  - now s/p ICD.   5. CAD - LHC 12/07/19 70-% LAD, no intervention - LHC 8/24 non obstructive CAD.  - Continue statin. On aspirin for VAD. - No s/s angina  6. Cardiac sarcoid - PET 2/22 at [facility] EF 25% + active sarcoid - Has completed prednisone.  - holding methotrexate w/ recent surgery, timing to restart to be discussed with MD - apical core pathology not diagnostic of cardiac sarcoidosis.   7. Paroxsymal AT/AF - Currently in NSR/ST   8. AKI - suspect cardiorenal, improved  w/ inotropic support - Creatinine stable now   9. Iron deficiency anemia/ Post-op anemia - recent T sat 15%, scheduled for OP feraheme. Will complete inpatient  - Transfused 1 u RBCs 8/15 - Hgb 9.3 today.    9. Pulmonary  - PFTs with severe obstructive defect, response to bronchodilator.  FEV1 1.04L, FEV1/FVC 48% - extubated 8/15  - Cultures NGTD.  - Stable  10. ID - Now afebrile - completed Meropenem/vanc  - Cultures NGTD. - Incentive spirometer and flutter valve at bedside, encouraged to use  11. Depression - On Remeron - On Marinol for appetite.  - Added zoloft  Encouraged to push PO intake and get OOB and work w/ PT. CIR team following. Needs to impove stamina with therapies to be considered   Robbie Lis PA-C   09/21/2022 7:14 AM  Patient seen and examined with the above-signed Advanced Practice Provider and/or Housestaff. I personally reviewed laboratory data, imaging studies and relevant notes. I independently examined the patient and formulated the important aspects of the plan. I have edited the note to reflect any of my changes or salient points. I have personally discussed the plan with the patient and/or family.  Remains weak. Appetite up and down. Denies CP or SOB.   Ramp echo yesterday with stable findings. Significant RV dysfunction.   MAPs 80-90 on midodrine. Volume ok   General:  NAD.  HEENT: normal  Neck: supple. JVP not elevated.  Carotids 2+ bilat; no bruits. No lymphadenopathy or thryomegaly appreciated. Cor: LVAD hum.  Lungs: Clear. Abdomen: soft, nontender, non-distended. No hepatosplenomegaly. No bruits or masses. Good bowel sounds. Driveline site clean. Anchor in place.  Extremities: no cyanosis, clubbing, rash. Warm no edema   Neuro: alert & oriented x 3. Weak on left  Making very slow progress but improving. Will continue current VAD speed based on ramp.   Continue to push ambulation and po intake.   INR 2.3 Discussed dosing with PharmD personally.  VAD interrogated personally. Parameters stable.  Hopefully to CIR soon.  Arvilla Meres, MD  9:55 AM

## 2022-09-21 NOTE — Progress Notes (Signed)
9 Days Post-Op Procedure(s) (LRB): RIGHT HEART CATH (N/A) Subjective: Patient examined, temporary pacing wires and chest tube sutures removed.  Surgical wounds are healing well.  VAD function with satisfactory flow and pulsatility.  He seems to be depressed again today-indifferent to his lunch tray and more focused on resting than working with physical therapy.   Objective: Vital signs in last 24 hours: Temp:  [98.2 F (36.8 C)-99.7 F (37.6 C)] 98.3 F (36.8 C) (09/05 1258) Pulse Rate:  [64-107] 75 (09/05 0736) Cardiac Rhythm: Normal sinus rhythm (09/05 0800) Resp:  [16-21] 20 (09/05 1129) BP: (90-105)/(61-90) 100/84 (09/05 1258) SpO2:  [90 %-97 %] 97 % (09/05 0831) Weight:  [77.7 kg] 77.7 kg (09/05 0432)  Hemodynamic parameters for last 24 hours:  Sinus rhythm Afebrile Intake/Output from previous day: 09/04 0701 - 09/05 0700 In: 240 [P.O.:240] Out: 500 [Urine:500] Intake/Output this shift: Total I/O In: 120 [P.O.:120] Out: 450 [Urine:450]  Exam Alert and responsive but appears despondent When I entered the room the patient was having a conversation with the chaplain/palliative care team who was trying to engage the patient, being very supportive and encouraging.  Normal sinus rhythm Lungs clear Sternal incision healing well VAD hum Slowly improving weakness of left arm and left leg  Lab Results: Recent Labs    09/20/22 0409 09/21/22 0446  WBC 10.5 10.8*  HGB 9.0* 9.3*  HCT 30.3* 31.5*  PLT 456* 416*   BMET:  Recent Labs    09/20/22 0409 09/21/22 0446  NA 140 136  K 4.5 4.2  CL 98 96*  CO2 30 28  GLUCOSE 82 74  BUN 27* 24*  CREATININE 0.77 0.76  CALCIUM 9.2 8.9    PT/INR:  Recent Labs    09/21/22 0446  LABPROT 25.7*  INR 2.3*   ABG    Component Value Date/Time   PHART 7.365 08/31/2022 1504   HCO3 33.2 (H) 09/12/2022 1108   HCO3 31.2 (H) 09/12/2022 1108   TCO2 35 (H) 09/12/2022 1108   TCO2 33 (H) 09/12/2022 1108   ACIDBASEDEF 4.0 (H)  08/31/2022 1504   O2SAT 66.9 09/16/2022 0539   CBG (last 3)  Recent Labs    09/20/22 2105 09/21/22 0610 09/21/22 1131  GLUCAP 76 70 73    Assessment/Plan: S/P Procedure(s) (LRB): RIGHT HEART CATH (N/A) Patient's functional status remains an issue.  He appears to be depressed about the recurrent stroke.  Hopefully his attitude will improve as his left-sided strength progresses.  With his underlying significant lung disease and poor mobility he is at risk for pulmonary setback. Continue current care and encouragement  LOS: 42 days    Lovett Sox 09/21/2022

## 2022-09-22 ENCOUNTER — Inpatient Hospital Stay (HOSPITAL_COMMUNITY): Payer: Medicare Other

## 2022-09-22 DIAGNOSIS — I5023 Acute on chronic systolic (congestive) heart failure: Secondary | ICD-10-CM | POA: Diagnosis not present

## 2022-09-22 LAB — PROTIME-INR
INR: 2.2 — ABNORMAL HIGH (ref 0.8–1.2)
Prothrombin Time: 25.1 s — ABNORMAL HIGH (ref 11.4–15.2)

## 2022-09-22 LAB — CBC
HCT: 30.8 % — ABNORMAL LOW (ref 39.0–52.0)
Hemoglobin: 9.2 g/dL — ABNORMAL LOW (ref 13.0–17.0)
MCH: 28.1 pg (ref 26.0–34.0)
MCHC: 29.9 g/dL — ABNORMAL LOW (ref 30.0–36.0)
MCV: 94.2 fL (ref 80.0–100.0)
Platelets: 400 10*3/uL (ref 150–400)
RBC: 3.27 MIL/uL — ABNORMAL LOW (ref 4.22–5.81)
RDW: 18.3 % — ABNORMAL HIGH (ref 11.5–15.5)
WBC: 13.1 10*3/uL — ABNORMAL HIGH (ref 4.0–10.5)
nRBC: 0.2 % (ref 0.0–0.2)

## 2022-09-22 LAB — LACTATE DEHYDROGENASE: LDH: 280 U/L — ABNORMAL HIGH (ref 98–192)

## 2022-09-22 LAB — MAGNESIUM: Magnesium: 2 mg/dL (ref 1.7–2.4)

## 2022-09-22 LAB — BASIC METABOLIC PANEL
Anion gap: 11 (ref 5–15)
BUN: 25 mg/dL — ABNORMAL HIGH (ref 6–20)
CO2: 27 mmol/L (ref 22–32)
Calcium: 8.8 mg/dL — ABNORMAL LOW (ref 8.9–10.3)
Chloride: 99 mmol/L (ref 98–111)
Creatinine, Ser: 0.88 mg/dL (ref 0.61–1.24)
GFR, Estimated: >60 mL/min (ref >60)
Glucose, Bld: 86 mg/dL (ref 70–99)
Potassium: 4.5 mmol/L (ref 3.5–5.1)
Sodium: 137 mmol/L (ref 135–145)

## 2022-09-22 LAB — GLUCOSE, CAPILLARY
Glucose-Capillary: 110 mg/dL — ABNORMAL HIGH (ref 70–99)
Glucose-Capillary: 92 mg/dL (ref 70–99)
Glucose-Capillary: 121 mg/dL — ABNORMAL HIGH (ref 70–99)
Glucose-Capillary: 120 mg/dL — ABNORMAL HIGH (ref 70–99)

## 2022-09-22 LAB — PROCALCITONIN: Procalcitonin: 0.15 ng/mL

## 2022-09-22 MED ORDER — WARFARIN SODIUM 2 MG PO TABS
2.0000 mg | ORAL_TABLET | Freq: Once | ORAL | Status: AC
  Administered 2022-09-22: 2 mg via ORAL
  Filled 2022-09-22: qty 1

## 2022-09-22 NOTE — Plan of Care (Signed)
  Problem: Education: Goal: Understanding of CV disease, CV risk reduction, and recovery process will improve Outcome: Not Progressing   Problem: Activity: Goal: Ability to return to baseline activity level will improve Outcome: Not Progressing   Problem: Cardiovascular: Goal: Ability to achieve and maintain adequate cardiovascular perfusion will improve Outcome: Progressing Goal: Vascular access site(s) Level 0-1 will be maintained Outcome: Progressing   Problem: Health Behavior/Discharge Planning: Goal: Ability to safely manage health-related needs after discharge will improve Outcome: Not Progressing   Problem: Education: Goal: Knowledge of General Education information will improve Description: Including pain rating scale, medication(s)/side effects and non-pharmacologic comfort measures Outcome: Not Progressing   Problem: Health Behavior/Discharge Planning: Goal: Ability to manage health-related needs will improve Outcome: Not Progressing   Problem: Clinical Measurements: Goal: Ability to maintain clinical measurements within normal limits will improve Outcome: Progressing Goal: Will remain free from infection Outcome: Progressing Goal: Diagnostic test results will improve Outcome: Progressing Goal: Respiratory complications will improve Outcome: Progressing Goal: Cardiovascular complication will be avoided Outcome: Progressing   Problem: Activity: Goal: Risk for activity intolerance will decrease Outcome: Not Progressing   Problem: Nutrition: Goal: Adequate nutrition will be maintained Outcome: Not Progressing   Problem: Coping: Goal: Level of anxiety will decrease Outcome: Not Progressing   Problem: Elimination: Goal: Will not experience complications related to urinary retention Outcome: Progressing   Problem: Pain Managment: Goal: General experience of comfort will improve Outcome: Progressing   Problem: Safety: Goal: Ability to remain free from  injury will improve Outcome: Progressing   Problem: Skin Integrity: Goal: Risk for impaired skin integrity will decrease Outcome: Progressing

## 2022-09-22 NOTE — Progress Notes (Signed)
Inpatient Rehabilitation Admissions Coordinator   We will follow up next week on his therapy progress with stamina and participation to assist with dispo options. I have not begun insurance Auth until progress made. Upon my visits this week, he has been very withdrawn and looking away from this provider when open discussions had. Hopeful for improvement with lots of encouragement.   Ottie Glazier, RN, MSN Rehab Admissions Coordinator [phone] 09/22/2022 4:05 PM

## 2022-09-22 NOTE — Plan of Care (Signed)
  Problem: Activity: Goal: Ability to return to baseline activity level will improve Outcome: Progressing   Problem: Cardiovascular: Goal: Ability to achieve and maintain adequate cardiovascular perfusion will improve Outcome: Progressing Goal: Vascular access site(s) Level 0-1 will be maintained Outcome: Progressing   Problem: Education: Goal: Knowledge of General Education information will improve Description: Including pain rating scale, medication(s)/side effects and non-pharmacologic comfort measures Outcome: Progressing   Problem: Health Behavior/Discharge Planning: Goal: Ability to manage health-related needs will improve Outcome: Progressing   Problem: Clinical Measurements: Goal: Ability to maintain clinical measurements within normal limits will improve Outcome: Progressing   Problem: Activity: Goal: Risk for activity intolerance will decrease Outcome: Progressing   Problem: Nutrition: Goal: Adequate nutrition will be maintained Outcome: Progressing   Problem: Coping: Goal: Level of anxiety will decrease Outcome: Progressing   Problem: Elimination: Goal: Will not experience complications related to urinary retention Outcome: Progressing   Problem: Pain Managment: Goal: General experience of comfort will improve Outcome: Progressing   Problem: Safety: Goal: Ability to remain free from injury will improve Outcome: Progressing   Problem: Skin Integrity: Goal: Risk for impaired skin integrity will decrease Outcome: Progressing

## 2022-09-22 NOTE — Progress Notes (Signed)
PICC line removed per order and with no complications.  Pressure held to achieve hemostasis.  Vaseline/gauze/tegaderm applied.  Aftercare instructions reviewed with pt and family who verbalized understanding.

## 2022-09-22 NOTE — Progress Notes (Signed)
LVAD Coordinator Rounding Note:  Admitted 08/10/22 due to acute on chronic CHF with cardiogenic shock. Milrinone dependent. Advance therapy workup completed, and pt deemed acceptable VAD candidate. Dental extractions completed 8/6. IABP placed 08/25/22.  HM 3 LVAD implanted on 08/29/22 by Dr Donata Clay under destination therapy criteria. 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 Result negative.  7/25 Admit with cardiogenic shock. Started milrinone and NE. 8/6 S/P 13 teeth extractions  8/9 IABP placed 8/13 S/p HM III LVAD implant + clipping left atrial appendage d/t severe thickening and invagination of mitral valve annulus impeding flows  CT Head 8/14 (initial) 1. 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 ASPECTS is 7. 2. No acute hemorrhage.  CT Angio Head/Neck 1. 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 2. Core infarct of 12 cc (somewhat underestimated compared to aspects) with 90 cc of penumbra. 3. Mild atherosclerosis.  Pt taken emergently to IR for percutaneous right common carotid arteriogram with thrombectomy. Revascularization achieved. Angio-seal closure device applied to left groin- clean, dry, and intact.   CT Head 8/15 @ 0450 Unchanged extent of infarct and hemorrhage in the right MCA distribution including small volume intraventricular clot. No hydrocephalus.  CT Head 8/17 @ 0701 Interval evolution of the right MCA territory infarct with decreased intraparenchymal and intraventricular hemorrhage. No hydrocephalus or midline shift.   Pt lying in bed resting on my arrival. Denies complaints. Encouraged him to participate with PT/OT, and to get out of bed to recliner today. He verbalized understanding.   WBC 13.1. Blood cultures pending. Encouraged to get OOB and use IS. He verbalized understanding.  Pacing wires removed yesterday by Dr Donata Clay.   Pt working with PT/OT/SLP. CIR consulted once medically  stable.   VAD coordinator/nurses working with pt on changing power sources. He is unable to do this due to his left sided weakness. Modification of his power cords was made in hopes he can grip the cords better. Has practice power cords for use by pt. See below for education documentation.   Vital signs: Temp: 99.3 HR: 82 Doppler Pressure: 84 Automatic BP: 91/80 (86) O2 Sat: 97% on RA Wt: 183.6>191.1>190.9>190.2>184.8>178.8>178.1>172.4>170.6>173.2>167.9>167.1>163.1>162.5>171.3>172.4 lbs   LVAD interrogation reveals:  Speed: 5400 Flow: 4.3 Power: 3.9 w PI: 3.6  Alarms: none Events: none  Hematocrit: 32 Fixed speed: 5400 Low speed limit: 5100  Drive Line: Pt's [name] performed dressing change with VAD coordinator supervision and verbal cues. Existing VAD dressing removed and site care performed using sterile technique. Drive line exit site cleaned with Chlora prep applicators x 2, allowed to dry, and Vashe moistened 2x2 placed around driveline then covered with dry 4x4. Exit site healed and incorporated, the velour is fully implanted at exit site. Small amount of serous drainage.Slight redness, no tenderness, foul odor or rash noted. Proximal suture remains. Drive line anchor re-applied. Pt denies fever or chills. Continue Monday/ Wednesday/Friday dressing changes. Next dressing change due 09/25/22 by VAD coordinator or nurse champion only.    Labs:  LDH trend: 596>441>165>384>345>312>312>350>327>309>316>305>298>277>273>298>280  INR trend: 1.4>1.6>1.1>1.1>1.3>1.2>1>1.3>1.4>1.5>1.6>2.8>2.8>2.3>2.2  AST/ALT trend: 218/45>96/30>73/23>63/36>58/37>69/50>93/72>123/103>143/150>117/138>119/138>92/127>87/123>50/71>46/65  Total Bili trend: 4.4>4.6>5.1>1.8>1.9>1.2>1.0>1.5>1.1>0.8>0.9>1.0  WBC trend: 11.6>13.7>19.6>16.1>23>26.6>21>24.6>18.1>16.5>13.2>13.1>12.5>11.6>10.5>10.8>13.1  Anticoagulation Plan: -INR Goal: 2.0 - 2.5 -ASA Dose: 81 mg  - Coumadin dosing per pharmacy  Blood  Products:  IntraOp 8/14: - 4 FFP - 2 Platelets - 2 PRBC - 1 cyro - 449 cc of cellsaver - DDAVP 20 mcg x 1   Device: Medtronic BiV -  -Therapies: OFF  Arrythmias:   Respiratory: RA  Infection:  09/01/22>> sputum cx>> NGTD>>final 09/01/22>> blood cx>> NGTD>>final 09/01/22>> urine cx>> NGTD>> final 09/13/22>>blood cxs>>no growth 5 days; final 09/22/22>>blood cxs>>pending  Renal:  8/22: BUN/CRT: 28/0.75  Adverse Events on VAD: - 08/30/22: - Developed left sided weakness this am. CTA with R MCA infarct. Taken to IR for thrombectomy   Drips:    Patient Education: Attempted to work on power source change with practice power cords with pt. Pt reluctant to participate. VAD coordinator provided hands on assistance and frequent verbal cues. Pt able to unscrew power cord connection, but was unable to pull apart/reconnect power cords without assistance. Left extremity too weak at this time to perform power change on his own. Coban reinforced on practice power leads to assist with grip.  Discharge teaching completed with pt's [name] on 9/5. See separate note for documentation.  VAD coordinator supervised Roberta changing VAD dressing. Will plan for her to perform dressing change again on Monday.   Plan/Recommendations:  Please page VAD coordinator for any alarms or VAD equipment issues. Continue MWF dressing changes by VAD coordinator or Nurse Alla Feeling.  [name] RN VAD Coordinator  Office: [phone]  24/7 Pager: [phone]

## 2022-09-22 NOTE — Progress Notes (Signed)
ANTICOAGULATION CONSULT NOTE  Pharmacy Consult for heparin >warfarin Indication:  LVAD  Allergies  Allergen Reactions   Amiodarone     Severe tremors   Percocet [Oxycodone-Acetaminophen] Itching    Patient Measurements: Height: 5\' 7"  (170.2 cm) Weight: 78.2 kg (172 lb 6.4 oz) IBW/kg (Calculated) : 66.1 Heparin Dosing Weight: 87kg  Vital Signs: Temp: 99.5 F (37.5 C) (09/06 0828) Temp Source: Oral (09/06 0828) BP: 91/80 (09/06 0828) Pulse Rate: 84 (09/06 0828)  Labs: Recent Labs    09/20/22 0409 09/21/22 0446 09/22/22 0330  HGB 9.0* 9.3*  --   HCT 30.3* 31.5*  --   PLT 456* 416*  --   LABPROT 30.0* 25.7* 25.1*  INR 2.8* 2.3* 2.2*  CREATININE 0.77 0.76 0.88    Estimated Creatinine Clearance: 84.5 mL/min (by C-G formula based on SCr of 0.88 mg/dL).   Medical History: Past Medical History:  Diagnosis Date   CAD (coronary artery disease)    CHF (congestive heart failure) (HCC)    GERD (gastroesophageal reflux disease)    Hyperlipidemia    Hypertension    Systolic heart failure (HCC) 2021   LVEF 18%, RVEF 38% on cardiac MRI 12/19/2019. possible cardiac sarcoidosis.   Wide-complex tachycardia 2021   wears LifeVest     Assessment: 57yoM on apixaban PTA for hx AF admitted for LVAD workup. Pt s/p HM3 implant on 8/13 c/b acute CVA postop. Head CT 8/17 stable, ok to start low dose heparin per neuro, warfarin started 8/19.  INR down to 2.2 today - warfarin was on hold since 9/2. Hgb stable at 9.3, plt 416 yesterday. LDH stable at 280. No s/sx of bleeding. Oral intake is still low, 2 ensures consumed yesterday.   Goal of Therapy:  INR 2-3 Monitor platelets by anticoagulation protocol: Yes   Plan:  Warfarin 2 mg tonight  Continue marinol for now  Daily INR, heparin level, CBC  Thank you for allowing pharmacy to participate in this patient's care,  Sherron Monday, PharmD, BCCCP Clinical Pharmacist  Phone: [phone] 09/22/2022 8:52 AM  Please check AMION for  all [facility] Pharmacy phone numbers After 10:00 PM, call Main Pharmacy [phone]

## 2022-09-22 NOTE — Progress Notes (Addendum)
Patient ID: Charles Holmes, male   DOB: 1965-08-04, 57 y.o.   MRN: [id number]     Advanced Heart Failure Rounding Note  PCP-Cardiologist: Norman Herrlich, MD  [facility]: Dr. Gala Romney   Subjective:    7/25: Admit with cardiogenic shock. Started milrinone and NE. 8/9 IABP placed 8/13 S/p HM III LVAD implant + clipping left atrial appendage d/t severe thickening and invagination of mitral valve annulus impeding flows. 8/14 Left-sided hemiplegia. CT head with acute R MCA infarct. IR for thrombectomy. CT head with small to moderate area of hemorrhagic conversion 8/17 Low dose heparin gtt restarted, CT head with decreased size of hemorrhagic stroke.  8/19: Limited echo: Mod RV dysfunction, better mitral inflow into the LV and VAD position satisfactory. 8/26: Echo showed moderate-severe RV dysfunction but RV not enlarged.  Interventricular septum does bow towards towards the right. Speed increased to 5400 rpm, flow rose to 4.9 L/min.  Interventricular septum remained stable bowing slightly towards the right.    8/27: RHC with RA mean 5, PA 30/7, mean PCWP 4, CI 3.49, PAPi 4.6 9/4: Ramp Echo>>Fixed speed 5400   Post Op Day #23  INR 2.2 LDH stable  MAPs 80s-90s on midodrine 10 TID  CVP 10  Seen with RN at bedside. Feels warm to touch. T 99.5 F. Denies cough. No subjective fever. Reports feeling fine.    LVAD Interrogation HM III: Speed: 5400 Flow: 4.7  PI: 3.2 Power: 4. No PI events. VAD interrogated personally, Parameters stable.  Objective:   Weight Range: 78.2 kg Body mass index is 27 kg/m.   Vital Signs:   Temp:  [98 F (36.7 C)-99.4 F (37.4 C)] 98.3 F (36.8 C) (09/06 0726) Pulse Rate:  [83-100] 100 (09/06 0726) Resp:  [19-23] 21 (09/06 0726) BP: (88-104)/(60-88) 99/88 (09/06 0726) SpO2:  [90 %-97 %] 92 % (09/06 0726) Weight:  [78.2 kg] 78.2 kg (09/06 0424) Last BM Date : 09/22/22  Weight change: Filed Weights   09/20/22 0410 09/21/22 0432 09/22/22 0424  Weight: 73.7 kg  77.7 kg 78.2 kg   Intake/Output:   Intake/Output Summary (Last 24 hours) at 09/22/2022 0806 Last data filed at 09/22/2022 0312 Gross per 24 hour  Intake 480 ml  Output 950 ml  Net -470 ml    MAP 80  Physical Exam  Physical Exam: GENERAL: Appears fatigued HEENT: normal  NECK: Supple, JVP 8-9 .  2+ bilaterally, no bruits.     CARDIAC:  Mechanical heart sounds with LVAD hum present.  LUNGS:  Clear to auscultation bilaterally.  ABDOMEN:  Soft, round, nontender, positive bowel sounds x4.     LVAD exit site: Dressing dry and intact.  Stabilization device present and accurately applied.   EXTREMITIES:  Warm and dry, no cyanosis, clubbing, rash, edema, + RUE PICC NEUROLOGIC:  Alert and oriented x 4.  Affect flat.   Telemetry    SR 80s-90s, ~ 5 PVCs/min, 1 6 beat run NSVT  Labs    CBC Recent Labs    09/20/22 0409 09/21/22 0446  WBC 10.5 10.8*  HGB 9.0* 9.3*  HCT 30.3* 31.5*  MCV 97.4 97.2  PLT 456* 416*    Basic Metabolic Panel Recent Labs    78/29/56 0446 09/22/22 0330  NA 136 137  K 4.2 4.5  CL 96* 99  CO2 28 27  GLUCOSE 74 86  BUN 24* 25*  CREATININE 0.76 0.88  CALCIUM 8.9 8.8*  MG 2.0 2.0    BNP: BNP (last 3 results) Recent Labs  09/04/22 2144 09/11/22 2252 09/18/22 2246  BNP 1,050.6* 933.0* 809.0*     Imaging    No results found.   Medications:     Scheduled Medications:  sodium chloride   Intravenous Once   aspirin EC  81 mg Oral Daily   atorvastatin  80 mg Oral Daily   Chlorhexidine Gluconate Cloth  6 each Topical Q0600   digoxin  0.125 mg Oral Daily   dorzolamide-timolol  1 drop Both Eyes BID   dronabinol  2.5 mg Oral BID AC   feeding supplement  237 mL Oral TID BM   Gerhardt's butt cream   Topical TID   insulin aspart  0-24 Units Subcutaneous TID WC & HS   latanoprost  1 drop Both Eyes QHS   melatonin  3 mg Oral QHS   mexiletine  250 mg Oral BID   midodrine  10 mg Oral Q8H   mirtazapine  7.5 mg Oral QHS    mometasone-formoterol  2 puff Inhalation BID   multivitamin with minerals  1 tablet Oral Daily   mouth rinse  15 mL Mouth Rinse 4 times per day   pantoprazole  40 mg Oral Daily   sertraline  50 mg Oral Daily   sildenafil  20 mg Oral TID   sodium chloride flush  3 mL Intravenous Q12H   umeclidinium bromide  1 puff Inhalation Daily   Warfarin - Pharmacist Dosing Inpatient   Does not apply q1600    Infusions:  sodium chloride Stopped (09/13/22 2035)    PRN Medications: sodium chloride, acetaminophen, albuterol, dextrose, ondansetron (ZOFRAN) IV, mouth rinse, oxyCODONE, polyethylene glycol, sodium chloride, sodium chloride flush, traMADol   Patient Profile  [name] is a 57 y.o. 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 Admitted with cardiogenic shock, stabilized and underwent HM3 LVAD. Post implant course c/b acute CVA.   Assessment/Plan  1.  Acute on chronic Systolic HF-->Cardiogenic Shock  - Diagnosed 11/2019. Presented with VT. LHC 70% LAD  - cMRI 12/21 concerning for sarcoid and EF 18%.  - PET 2/22 at [facility] EF 25% + active sarcoid - Echo 08/26/20 EF < 20% severely dilated LV RV mildly decreased.  - Medtronic CRT-D upgrade in 06/08/21 - Echo 07/10/22: EF <20%, RV okay, mod pericardial effusion, mod MR/TR - Admitted 07/25 with cardiogenic shock. - RHC: Nonobstructive CAD, severely elevated filling pressures and low Fick CO/CI (2.7/1.4) - 08/13 HM III LVAD implant + clipping LAA d/t severe thickening and invagination of mitral valve annulus impeding flows.  - Apical core sent - no mention of sarcoid - Speed increased to 5300 on 08/14.  - Echo 8/26 mod-sev RV dysfunction. Speed increased to 5400 8/26 - RHC 8/27: low filing pressures with excellent cardiac output on EPI, PAPi 4.6 - Ramp Echo 9/4, speed increased to 5400  - MAPs 80s-90s. Continue midodrine - CVP 10. Will hold off on diuretic with concern for possible  infection - Continue digoxin 0.125 mg daily - Hopefully to CIR soon, needs to participate more with PT. Will request PT see today  2. HM-3 LVAD - VAD interrogated personally. Parameters stable. - LDH stable - INR 2.2. ASA 81 mg daily. Discussed Warfarin dosing with PharmD personally.  3.  Acute stroke - Hx CVA 06/24 -Admitted 06/24 w/ R MCA stroke. S/p TPA and mechanical clot extraction. No residual deficits. Likely cardioembolic in setting of severe LV dysfunction. - Developed left sided weakness 08/14.  CTA with R MCA infarct. Taken to IR for thrombectomy - Repeat CT head with small to moderate size hemorrhagic conversion.  - repeat head CT on 8/17 w/ improved hemorrhagic CVA - Back on warfarin - neurology signed off 09/03/22.  - Continue PT/OT - Continue ASA 81 mg daily - Plan CIR   4. 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 Continue mexiletine  - now s/p ICD.   5. CAD - LHC 12/07/19 70-% LAD, no intervention - LHC 8/24 non obstructive CAD.  - Continue statin. On aspirin for VAD. 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  - holding methotrexate w/ recent surgery, timing to restart to be discussed with MD - apical core pathology not diagnostic of cardiac sarcoidosis.   7. Paroxsymal AT/AF - Currently in NSR   8. AKI - suspect cardiorenal, improved w/ inotropic support - Creatinine stable now   9. Iron deficiency anemia/ Post-op anemia - recent T sat 15%, scheduled for OP feraheme. Will complete inpatient  - Transfused 1 u RBCs 8/15 - Hgb stable 9s   9. Pulmonary  - PFTs with severe obstructive defect, response to bronchodilator. FEV1 1.04L, FEV1/FVC 48% - extubated 8/15  - Cultures 08/28 - NG - Stable  10. ID - completed Meropenem/vanc 08/26 - BC X 2 08/28 NG - Low grade temp this am, T 99.5. Trend fever curve. Check CBC. Remove PICC. Check BC X 2 and PCT. Chest  x-ray - Incentive spirometer and flutter valve at bedside, encouraged to use  11. Depression - On Remeron - On Marinol for appetite.  - Added zoloft   FINCH, LINDSAY N PA-C   Patient seen and examined with the above-signed Advanced Practice Provider and/or Housestaff. I personally reviewed laboratory data, imaging studies and relevant notes. I independently examined the patient and formulated the important aspects of the plan. I have edited the note to reflect any of my changes or salient points. I have personally discussed the plan with the patient and/or family.  Had low-grate temp this am. No localizing symptoms. Working with PT/OT. Fatigues easily. Able to get up to bedside commode and chair x 2.   MAPS ok on midodrine. INR 2.2  VAD interrogated personally. Parameters stable.  General: Lying in bed  NAD.  HEENT: normal  Neck: supple. JVP 8-9 Cor: LVAD hum.  Lungs: Clear. Decreased at bases Abdomen: soft, nontender, non-distended. No hepatosplenomegaly. No bruits or masses. Good bowel sounds. Driveline site clean. Anchor in place.  Extremities: no cyanosis, clubbing, rash. Warm 1+ edema  Neuro: alert & oriented x 3.weak on left  Trend fever curve. Check CXR and PCT. Encouraged IS. Low threshold for abx as needed with recent VAD  Continue PT/OT. Encourage more ambulation and po intake.   Can wean midodrine slowly.   Needs gentle diuresis.   Should be ready for CIR by Monday.   Arvilla Meres, MD  3:33 PM

## 2022-09-22 NOTE — Progress Notes (Signed)
This chaplain attempted F/U spiritual care. The Pt. is sleeping. This chaplain updated PMT and will attempt a revisit.  Chaplain Stephanie Acre [phone]

## 2022-09-22 NOTE — Progress Notes (Signed)
Physical Therapy Treatment Patient Details Name: Charles Holmes MRN: [id number] DOB: [birth year] Today's Date: 09/22/2022   History of Present Illness Pt is 57 year old presented to [facility] on  08/10/22 for acute on chronic systolic heart failure. Pt had IABP placed on 08/25/22 for ongoing cardiogenic shock. Underwent placement of LVAD on 8/13. Developed lt sided weakness on 8/14. Rt MCA CVA. Underwent mechanical thrombectomy by IR. Extubated 8/15.  PMH - CAD, CHF, HLD, HTN, systolic HF, ICD implant, rt CVA.  LVAD placement and new stroke R MCA M1.    PT Comments  Pt admitted with above diagnosis. Pt was able to transfer to and from 3N1 with mod assist of 2 as pt needs cues and assist for safety as he has inattention to left hemibody as well as weakness. Nurse wanted pt back to bed after treatment as VAD coordinator coming to do dressing changes with pt and family.  Will continue to follow acutely.  Pt currently with functional limitations due to the deficits listed below (see PT Problem List). Pt will benefit from acute skilled PT to increase their independence and safety with mobility to allow discharge.       If plan is discharge home, recommend the following: Assistance with cooking/housework;Direct supervision/assist for medications management;Assist for transportation   Can travel by private vehicle        Equipment Recommendations  Other (comment) (To be determined)    Recommendations for Other Services       Precautions / Restrictions Precautions Precautions: Sternal;Fall Precaution Comments: LVAD Restrictions Weight Bearing Restrictions: No RUE Weight Bearing: Non weight bearing RLE Weight Bearing: Non weight bearing Other Position/Activity Restrictions: Sternal Precautions     Mobility  Bed Mobility Overal bed mobility: Needs Assistance Bed Mobility: Supine to Sit Rolling: Min assist Sidelying to sit: Mod assist, HOB elevated Supine to sit: Mod assist Sit to supine: Mod  assist   General bed mobility comments: mod A for trunk elevation and to return BLE's to bed    Transfers Overall transfer level: Needs assistance Equipment used: 1 person hand held assist Transfers: Sit to/from Stand, Bed to chair/wheelchair/BSC Sit to Stand: Min assist   Step pivot transfers: Mod assist, +2 safety/equipment, From elevated surface       General transfer comment: min assist to stand of 2, mod assist to pivot as his left LE doesnt always maintain engagement and can buckle questionably due to inattention vs weakness. Pt did better pivoting to right side vs. left side as well with +2 mod assist to min assist.  Pt on bedpan on arrival and wanted to try 3N1 therefore that is what the transfer to and from was . Transfer via Lift Equipment: Antony Salmon  Ambulation/Gait Ambulation/Gait assistance: Min assist   Assistive device: Fara Boros Gait Pattern/deviations: Step-through pattern, Decreased step length - right, Decreased step length - left, Trunk flexed Gait velocity: decr     General Gait Details: pt ultimately refused to ambulate stating "I just don't feel good. My abdominal muscles are sore from falling this morning." Max encouragement and education provided on the importance to continues OOB mobility and ambulation. Pt took 4 side steps to [facility] with eva walker and modA to manage walker   Stairs   Stairs assistance: Supervision Stair Management: One rail Left, Alternating pattern, Forwards   General stair comments: ascended no rail, descended holding rail with cues   Wheelchair Mobility     Tilt Bed    Modified Rankin (Stroke Patients Only) Modified  Rankin (Stroke Patients Only) Pre-Morbid Rankin Score: No symptoms Modified Rankin: Moderately severe disability     Balance Overall balance assessment: Needs assistance Sitting-balance support: Feet supported, No upper extremity supported Sitting balance-Leahy Scale: Poor Sitting balance - Comments: tPt  needed constant cues and UE suport to sit EOB with tendency to lean left unless cued. Therefore min assist at all times for sitting. Postural control: Posterior lean, Left lateral lean (attempting to return to supine) Standing balance support: Bilateral upper extremity supported Standing balance-Leahy Scale: Poor Standing balance comment: min assist for static standing                 Standardized Balance Assessment Standardized Balance Assessment : Dynamic Gait Index   Dynamic Gait Index Level Surface: Normal Change in Gait Speed: Normal Gait with Horizontal Head Turns: Normal Gait with Vertical Head Turns: Normal Gait and Pivot Turn: Normal Step Over Obstacle: Normal Step Around Obstacles: Normal Steps: Mild Impairment Total Score: 23      Cognition Arousal: Alert Behavior During Therapy: WFL for tasks assessed/performed Overall Cognitive Status: Impaired/Different from baseline Area of Impairment: Safety/judgement, Problem solving, Memory                     Memory: Decreased recall of precautions Following Commands: Follows one step commands consistently Safety/Judgement: Decreased awareness of deficits Awareness: Emergent Problem Solving: Slow processing General Comments: flat affect, max encouragement to participate, inattention to left at times        Exercises General Exercises - Upper Extremity Shoulder Flexion: AROM, 5 reps, Left, Supine Elbow Flexion: AAROM, Left, 5 reps, Supine Elbow Extension: AAROM, Left, 5 reps, Supine Wrist Flexion: AROM, Left, 5 reps, Supine Wrist Extension: AROM, Left, 5 reps, Supine Digit Composite Flexion: AROM, Left, 5 reps, Supine Composite Extension: AROM, Left, 5 reps, Supine General Exercises - Lower Extremity Ankle Circles/Pumps: AROM, Both, 10 reps, Supine Quad Sets: AROM, Both, Supine, 5 reps Short Arc Quad: AROM, Left, 5 reps, Supine Long Arc Quad: AROM, Left, 10 reps, Seated, Right, Both Heel Slides:  AAROM, Left, Seated, 10 reps (against manual resistance with L foot on washcloth on floor) Hip ABduction/ADduction: Left, 10 reps, Seated, AROM, Strengthening (squeezed pillow) Straight Leg Raises: AAROM, Left, 5 reps, Supine Heel Raises:  (against manual pressure) Other Exercises Other Exercises: squeeze ball x10 LUE Other Exercises: LUE digit adduction/abduction Other Exercises: LUE digit opposition, active assist Other Exercises: seated washcloth push to 4 corners of table with LUE x5 Other Exercises: hand over hand assist to simulate twisting VAD coords together/apart    General Comments General comments (skin integrity, edema, etc.): VSS      Pertinent Vitals/Pain Pain Assessment Pain Assessment: Faces Faces Pain Scale: Hurts little more Pain Location: back Pain Descriptors / Indicators: Grimacing Pain Intervention(s): Limited activity within patient's tolerance, Monitored during session, Repositioned    Home Living                          Prior Function            PT Goals (current goals can now be found in the care plan section) Acute Rehab PT Goals Patient Stated Goal: not stated PT Goal Formulation: With patient Time For Goal Achievement: 09/29/22 Potential to Achieve Goals: Fair Progress towards PT goals: Progressing toward goals    Frequency    Min 1X/week      PT Plan Current plan remains appropriate    Co-evaluation PT/OT/SLP Co-Evaluation/Treatment:  Yes Reason for Co-Treatment: For patient/therapist safety PT goals addressed during session: Mobility/safety with mobility OT goals addressed during session: ADL's and self-care      AM-PAC PT "6 Clicks" Mobility   Outcome Measure  Help needed turning from your back to your side while in a flat bed without using bedrails?: A Lot Help needed moving from lying on your back to sitting on the side of a flat bed without using bedrails?: A Lot Help needed moving to and from a bed to a chair  (including a wheelchair)?: A Lot Help needed standing up from a chair using your arms (e.g., wheelchair or bedside chair)?: Total Help needed to walk in hospital room?: Total Help needed climbing 3-5 steps with a railing? : Total 6 Click Score: 9    End of Session Equipment Utilized During Treatment: Gait belt Activity Tolerance: Patient limited by fatigue Patient left: with call bell/phone within reach;in bed;with bed alarm set;with nursing/sitter in room (LVAD coordinator coming in to work with family on dressing changes and wanted pt in bed) Nurse Communication: Mobility status PT Visit Diagnosis: Other abnormalities of gait and mobility (R26.89);Difficulty in walking, not elsewhere classified (R26.2);Hemiplegia and hemiparesis Hemiplegia - Right/Left: Left Hemiplegia - dominant/non-dominant: Non-dominant Hemiplegia - caused by: Cerebral infarction     Time: 1012-1040 PT Time Calculation (min) (ACUTE ONLY): 28 min  Charges:    $Therapeutic Activity: 23-37 mins PT General Charges $$ ACUTE PT VISIT: 1 Visit                     Arielys Wandersee M,PT Acute Rehab Services [phone]    Bevelyn Buckles 09/22/2022, 12:22 PM

## 2022-09-22 NOTE — Progress Notes (Signed)
Occupational Therapy Treatment Patient Details Name: Charles Holmes MRN: [id number] DOB: 1965-05-05 Today's Date: 09/22/2022   History of present illness Pt is 57 year old presented to [facility] on  08/10/22 for acute on chronic systolic heart failure. Pt had IABP placed on 08/25/22 for ongoing cardiogenic shock. Underwent placement of LVAD on 8/13. Developed lt sided weakness on 8/14. Rt MCA CVA. Underwent mechanical thrombectomy by IR. Extubated 8/15.  PMH - CAD, CHF, HLD, HTN, systolic HF, ICD implant, rt CVA.  LVAD placement and new stroke R MCA M1.   OT comments  Patient with good progress toward patient focused goals.  Patient beginning to transition from bedlevel to sitting ADL tasks, needing Max A for lower body ADL from sit to stand and up to Mod A for upper body ADL seated.  Patient demonstrating improved A/AAROM to LUE.  OT to continue efforts in the acute setting to address deficits and Patient will benefit from intensive inpatient follow up therapy, >3 hours/day       If plan is discharge home, recommend the following:  Two people to help with walking and/or transfers;Help with stairs or ramp for entrance;Assistance with feeding;Assist for transportation;Assistance with cooking/housework;A lot of help with bathing/dressing/bathroom   Equipment Recommendations       Recommendations for Other Services      Precautions / Restrictions Precautions Precautions: Sternal;Fall Precaution Comments: LVAD Restrictions Weight Bearing Restrictions: No Other Position/Activity Restrictions: Sternal Precautions       Mobility Bed Mobility Overal bed mobility: Needs Assistance Bed Mobility: Supine to Sit   Sidelying to sit: Mod assist, HOB elevated            Transfers Overall transfer level: Needs assistance Equipment used: 1 person hand held assist Transfers: Sit to/from Stand, Bed to chair/wheelchair/BSC Sit to Stand: Min assist     Step pivot transfers: Min assist, Mod  assist           Balance Overall balance assessment: Needs assistance Sitting-balance support: Feet supported, No upper extremity supported Sitting balance-Leahy Scale: Fair     Standing balance support: Bilateral upper extremity supported Standing balance-Leahy Scale: Poor                             ADL either performed or assessed with clinical judgement   ADL   Eating/Feeding: Sitting;Minimal assistance   Grooming: Sitting;Minimal assistance   Upper Body Bathing: Moderate assistance;Sitting   Lower Body Bathing: Maximal assistance;Sitting/lateral leans   Upper Body Dressing : Moderate assistance;Sitting   Lower Body Dressing: Moderate assistance;Sitting/lateral leans                      Extremity/Trunk Assessment Upper Extremity Assessment Upper Extremity Assessment: Right hand dominant RUE Sensation: WNL RUE Coordination: WNL LUE Deficits / Details: Improved A/AAROM to LUE.  Able to achieve full grasp, and 3/4 finger extension.  AROM noted to wrist, elbow and shoulder.  MMT grossly 2+/5 LUE Sensation: WNL LUE Coordination: decreased fine motor;decreased gross motor   Lower Extremity Assessment Lower Extremity Assessment: Defer to PT evaluation   Cervical / Trunk Assessment Cervical / Trunk Assessment: Kyphotic    Vision Patient Visual Report: No change from baseline     Perception     Praxis      Cognition Arousal: Alert Behavior During Therapy: WFL for tasks assessed/performed Overall Cognitive Status: Impaired/Different from baseline Area of Impairment: Safety/judgement, Problem solving, Memory  Memory: Decreased recall of precautions Following Commands: Follows one step commands consistently Safety/Judgement: Decreased awareness of deficits   Problem Solving: Slow processing          Exercises      Shoulder Instructions       General Comments      Pertinent Vitals/ Pain       Pain  Assessment Pain Assessment: No/denies pain Pain Intervention(s): Monitored during session                                                          Frequency  Min 1X/week        Progress Toward Goals  OT Goals(current goals can now be found in the care plan section)  Progress towards OT goals: Progressing toward goals  Acute Rehab OT Goals OT Goal Formulation: With patient Time For Goal Achievement: 09/28/22 Potential to Achieve Goals: Fair  Plan      Co-evaluation                 AM-PAC OT "6 Clicks" Daily Activity     Outcome Measure   Help from another person eating meals?: A Little Help from another person taking care of personal grooming?: A Little Help from another person toileting, which includes using toliet, bedpan, or urinal?: A Lot Help from another person bathing (including washing, rinsing, drying)?: A Lot Help from another person to put on and taking off regular upper body clothing?: A Lot Help from another person to put on and taking off regular lower body clothing?: A Lot 6 Click Score: 14    End of Session Equipment Utilized During Treatment: Gait belt  OT Visit Diagnosis: Unsteadiness on feet (R26.81);Other abnormalities of gait and mobility (R26.89);Muscle weakness (generalized) (M62.81)   Activity Tolerance Patient tolerated treatment well   Patient Left in chair;with call bell/phone within reach;with family/visitor present   Nurse Communication Mobility status        Time: 1129-1150 OT Time Calculation (min): 21 min  Charges: OT General Charges $OT Visit: 1 Visit OT Treatments $Self Care/Home Management : 8-22 mins  09/22/2022  RP, OTR/L  Acute Rehabilitation Services  Office:  [phone]   Suzanna Obey 09/22/2022, 12:08 PM

## 2022-09-23 DIAGNOSIS — I5023 Acute on chronic systolic (congestive) heart failure: Secondary | ICD-10-CM | POA: Diagnosis not present

## 2022-09-23 DIAGNOSIS — Z515 Encounter for palliative care: Secondary | ICD-10-CM | POA: Diagnosis not present

## 2022-09-23 LAB — BLOOD CULTURE ID PANEL (REFLEXED) - BCID2

## 2022-09-23 LAB — GLUCOSE, CAPILLARY
Glucose-Capillary: 67 mg/dL — ABNORMAL LOW (ref 70–99)
Glucose-Capillary: 157 mg/dL — ABNORMAL HIGH (ref 70–99)
Glucose-Capillary: 112 mg/dL — ABNORMAL HIGH (ref 70–99)
Glucose-Capillary: 92 mg/dL (ref 70–99)
Glucose-Capillary: 117 mg/dL — ABNORMAL HIGH (ref 70–99)

## 2022-09-23 LAB — BASIC METABOLIC PANEL
Anion gap: 10 (ref 5–15)
BUN: 20 mg/dL (ref 6–20)
CO2: 27 mmol/L (ref 22–32)
Calcium: 8.7 mg/dL — ABNORMAL LOW (ref 8.9–10.3)
Chloride: 98 mmol/L (ref 98–111)
Creatinine, Ser: 0.86 mg/dL (ref 0.61–1.24)
GFR, Estimated: >60 mL/min (ref >60)
Glucose, Bld: 82 mg/dL (ref 70–99)
Potassium: 3.9 mmol/L (ref 3.5–5.1)
Sodium: 135 mmol/L (ref 135–145)

## 2022-09-23 LAB — PROTIME-INR
INR: 1.9 — ABNORMAL HIGH (ref 0.8–1.2)
Prothrombin Time: 21.8 seconds — ABNORMAL HIGH (ref 11.4–15.2)

## 2022-09-23 LAB — MAGNESIUM: Magnesium: 1.9 mg/dL (ref 1.7–2.4)

## 2022-09-23 LAB — LACTATE DEHYDROGENASE: LDH: 317 U/L — ABNORMAL HIGH (ref 98–192)

## 2022-09-23 MED ORDER — WARFARIN SODIUM 2 MG PO TABS
2.0000 mg | ORAL_TABLET | Freq: Once | ORAL | Status: AC
  Administered 2022-09-23: 2 mg via ORAL
  Filled 2022-09-23: qty 1

## 2022-09-23 MED ORDER — SODIUM CHLORIDE 0.9 % IV SOLN
2.0000 g | INTRAVENOUS | Status: DC
  Administered 2022-09-23 – 2022-09-27 (×24): 2 g via INTRAVENOUS
  Filled 2022-09-23 (×29): qty 2000

## 2022-09-23 MED ORDER — POTASSIUM CHLORIDE CRYS ER 20 MEQ PO TBCR
20.0000 meq | EXTENDED_RELEASE_TABLET | Freq: Once | ORAL | Status: AC
  Administered 2022-09-23: 20 meq via ORAL
  Filled 2022-09-23: qty 1

## 2022-09-23 MED ORDER — MAGNESIUM SULFATE 2 GM/50ML IV SOLN
2.0000 g | Freq: Once | INTRAVENOUS | Status: AC
  Administered 2022-09-23: 2 g via INTRAVENOUS
  Filled 2022-09-23: qty 50

## 2022-09-23 MED ORDER — SORBITOL 70 % SOLN
30.0000 mL | Freq: Once | Status: AC
  Administered 2022-09-23: 30 mL via ORAL
  Filled 2022-09-23: qty 30

## 2022-09-23 MED ORDER — SODIUM CHLORIDE 0.9 % IV SOLN
2.0000 g | Freq: Two times a day (BID) | INTRAVENOUS | Status: DC
  Administered 2022-09-23 – 2022-09-27 (×9): 2 g via INTRAVENOUS
  Filled 2022-09-23 (×9): qty 20

## 2022-09-23 NOTE — Plan of Care (Signed)
  Problem: Education: Goal: Understanding of CV disease, CV risk reduction, and recovery process will improve Outcome: Not Progressing Goal: Individualized Educational Video(s) Outcome: Not Progressing   Problem: Activity: Goal: Ability to return to baseline activity level will improve Outcome: Not Progressing   Problem: Cardiovascular: Goal: Ability to achieve and maintain adequate cardiovascular perfusion will improve Outcome: Not Progressing Goal: Vascular access site(s) Level 0-1 will be maintained Outcome: Progressing   Problem: Health Behavior/Discharge Planning: Goal: Ability to safely manage health-related needs after discharge will improve Outcome: Not Progressing   Problem: Education: Goal: Knowledge of General Education information will improve Description: Including pain rating scale, medication(s)/side effects and non-pharmacologic comfort measures Outcome: Not Progressing   Problem: Health Behavior/Discharge Planning: Goal: Ability to manage health-related needs will improve Outcome: Not Progressing   Problem: Clinical Measurements: Goal: Ability to maintain clinical measurements within normal limits will improve Outcome: Not Progressing Goal: Will remain free from infection Outcome: Not Progressing Goal: Diagnostic test results will improve Outcome: Not Progressing Goal: Respiratory complications will improve Outcome: Not Progressing Goal: Cardiovascular complication will be avoided Outcome: Not Progressing   Problem: Activity: Goal: Risk for activity intolerance will decrease Outcome: Not Progressing   Problem: Nutrition: Goal: Adequate nutrition will be maintained Outcome: Not Progressing   Problem: Coping: Goal: Level of anxiety will decrease Outcome: Not Progressing   Problem: Elimination: Goal: Will not experience complications related to urinary retention Outcome: Not Progressing   Problem: Pain Managment: Goal: General experience of  comfort will improve Outcome: Not Progressing   Problem: Safety: Goal: Ability to remain free from injury will improve Outcome: Not Progressing   Problem: Skin Integrity: Goal: Risk for impaired skin integrity will decrease Outcome: Not Progressing

## 2022-09-23 NOTE — Progress Notes (Signed)
PHARMACY - PHYSICIAN COMMUNICATION CRITICAL VALUE ALERT - BLOOD CULTURE IDENTIFICATION (BCID)  Charles Holmes is an 57 y.o. male with CHF s/p LVAD, now with fevers  Assessment:  2/2 blood cultures growing Enterococcus faecalis  Name of physician (or Provider) Contacted:  Dr. Brayton Layman  Current antibiotics: None  Changes to prescribed antibiotics recommended:  Start Ampicillin 2 g IV q4h  Results for orders placed or performed during the hospital encounter of 08/10/22  Blood Culture ID Panel (Reflexed) (Collected: 09/22/2022  8:54 AM)  Result Value Ref Range   Enterococcus faecalis DETECTED (A) NOT DETECTED   Enterococcus Faecium NOT DETECTED NOT DETECTED   Listeria monocytogenes NOT DETECTED NOT DETECTED   Staphylococcus species NOT DETECTED NOT DETECTED   Staphylococcus aureus (BCID) NOT DETECTED NOT DETECTED   Staphylococcus epidermidis NOT DETECTED NOT DETECTED   Staphylococcus lugdunensis NOT DETECTED NOT DETECTED   Streptococcus species NOT DETECTED NOT DETECTED   Streptococcus agalactiae NOT DETECTED NOT DETECTED   Streptococcus pneumoniae NOT DETECTED NOT DETECTED   Streptococcus pyogenes NOT DETECTED NOT DETECTED   A.calcoaceticus-baumannii NOT DETECTED NOT DETECTED   Bacteroides fragilis NOT DETECTED NOT DETECTED   Enterobacterales NOT DETECTED NOT DETECTED   Enterobacter cloacae complex NOT DETECTED NOT DETECTED   Escherichia coli NOT DETECTED NOT DETECTED   Klebsiella aerogenes NOT DETECTED NOT DETECTED   Klebsiella oxytoca NOT DETECTED NOT DETECTED   Klebsiella pneumoniae NOT DETECTED NOT DETECTED   Proteus species NOT DETECTED NOT DETECTED   Salmonella species NOT DETECTED NOT DETECTED   Serratia marcescens NOT DETECTED NOT DETECTED   Haemophilus influenzae NOT DETECTED NOT DETECTED   Neisseria meningitidis NOT DETECTED NOT DETECTED   Pseudomonas aeruginosa NOT DETECTED NOT DETECTED   Stenotrophomonas maltophilia NOT DETECTED NOT DETECTED   Candida albicans  NOT DETECTED NOT DETECTED   Candida auris NOT DETECTED NOT DETECTED   Candida glabrata NOT DETECTED NOT DETECTED   Candida krusei NOT DETECTED NOT DETECTED   Candida parapsilosis NOT DETECTED NOT DETECTED   Candida tropicalis NOT DETECTED NOT DETECTED   Cryptococcus neoformans/gattii NOT DETECTED NOT DETECTED   Vancomycin resistance NOT DETECTED NOT DETECTED    [name] 09/23/2022  1:20 AM

## 2022-09-23 NOTE — Progress Notes (Addendum)
Patient ID: Charles Holmes, male   DOB: [birth year], 57 y.o.   MRN: [id number]     Advanced Heart Failure Rounding Note  PCP-Cardiologist: Charles Herrlich, MD  [facility]: Dr. Gala Holmes   Subjective:    7/25: Admit with cardiogenic shock. Started milrinone and NE. 8/9 IABP placed 8/13 S/p HM III LVAD implant + clipping left atrial appendage d/t severe thickening and invagination of mitral valve annulus impeding flows. 8/14 Left-sided hemiplegia. CT head with acute R MCA infarct. IR for thrombectomy. CT head with small to moderate area of hemorrhagic conversion 8/17 Low dose heparin gtt restarted, CT head with decreased size of hemorrhagic stroke.  8/19: Limited echo: Mod RV dysfunction, better mitral inflow into the LV and VAD position satisfactory. 8/26: Echo showed moderate-severe RV dysfunction but RV not enlarged.  Interventricular septum does bow towards towards the right. Speed increased to 5400 rpm, flow rose to 4.9 L/min.  Interventricular septum remained stable bowing slightly towards the right.    8/27: RHC with RA mean 5, PA 30/7, mean PCWP 4, CI 3.49, PAPi 4.6 9/4: Ramp Echo>>Fixed speed 5400  9/6: fever. Bcx with enterococcus facelis 2/2  Feels ok. Denies CP or SOB. Ambulated some today. C/o constipation  MAPs 70-80s on midodrine 10  Tmax 101.4 yesterday. Bcx with enterococcus facelis ID has seen. Now on amp/ceftriaxone  LVAD Interrogation HM III: Speed: 5400 Flow: 4.6  PI: 3.0 Power: 4.0. No PI events. VAD interrogated personally. Parameters stable.  Objective:   Weight Range: 77.4 kg Body mass index is 26.73 kg/m.   Vital Signs:   Temp:  [98.1 F (36.7 C)-101.4 F (38.6 C)] 98.4 F (36.9 C) (09/07 1146) Pulse Rate:  [78-145] 85 (09/07 1146) Resp:  [15-23] 20 (09/07 1146) BP: (80-91)/(61-77) 89/77 (09/07 1146) SpO2:  [91 %-94 %] 91 % (09/07 0801) Weight:  [77.4 kg] 77.4 kg (09/07 0500) Last BM Date : 09/22/22  Weight change: Filed Weights   09/21/22 0432 09/22/22  0424 09/23/22 0500  Weight: 77.7 kg 78.2 kg 77.4 kg   Intake/Output:   Intake/Output Summary (Last 24 hours) at 09/23/2022 1243 Last data filed at 09/23/2022 1145 Gross per 24 hour  Intake 580 ml  Output 850 ml  Net -270 ml      Physical Exam  General:  NAD.  HEENT: normal  Neck: supple. JVP not elevated.  Carotids 2+ bilat; no bruits. No lymphadenopathy or thryomegaly appreciated. Cor: LVAD hum.  Lungs: Clear. Abdomen:  soft, nontender, non-distended. No hepatosplenomegaly. No bruits or masses. Good bowel sounds. Driveline site clean. Anchor in place.  Extremities: no cyanosis, clubbing, rash. Warm no edema  Neuro: alert & oriented x 3.  weak on L   Telemetry    SR 80s-90s Personally reviewed  Labs    CBC Recent Labs    09/21/22 0446 09/22/22 0919  WBC 10.8* 13.1*  HGB 9.3* 9.2*  HCT 31.5* 30.8*  MCV 97.2 94.2  PLT 416* 400    Basic Metabolic Panel Recent Labs    41/32/44 0330 09/23/22 0224  NA 137 135  K 4.5 3.9  CL 99 98  CO2 27 27  GLUCOSE 86 82  BUN 25* 20  CREATININE 0.88 0.86  CALCIUM 8.8* 8.7*  MG 2.0 1.9    BNP: BNP (last 3 results) Recent Labs    09/04/22 2144 09/11/22 2252 09/18/22 2246  BNP 1,050.6* 933.0* 809.0*     Imaging    No results found.   Medications:     Scheduled  Medications:  sodium chloride   Intravenous Once   aspirin EC  81 mg Oral Daily   atorvastatin  80 mg Oral Daily   digoxin  0.125 mg Oral Daily   dorzolamide-timolol  1 drop Both Eyes BID   dronabinol  2.5 mg Oral BID AC   feeding supplement  237 mL Oral TID BM   Gerhardt's butt cream   Topical TID   insulin aspart  0-24 Units Subcutaneous TID WC & HS   latanoprost  1 drop Both Eyes QHS   melatonin  3 mg Oral QHS   mexiletine  250 mg Oral BID   midodrine  10 mg Oral Q8H   mirtazapine  7.5 mg Oral QHS   mometasone-formoterol  2 puff Inhalation BID   multivitamin with minerals  1 tablet Oral Daily   mouth rinse  15 mL Mouth Rinse 4 times per day    pantoprazole  40 mg Oral Daily   sertraline  50 mg Oral Daily   sildenafil  20 mg Oral TID   sodium chloride flush  3 mL Intravenous Q12H   sorbitol  30 mL Oral Once   umeclidinium bromide  1 puff Inhalation Daily   Warfarin - Pharmacist Dosing Inpatient   Does not apply q1600    Infusions:  sodium chloride Stopped (09/13/22 2035)   ampicillin (OMNIPEN) IV 2 g (09/23/22 0841)   cefTRIAXone (ROCEPHIN)  IV 2 g (09/23/22 1056)    PRN Medications: sodium chloride, acetaminophen, albuterol, dextrose, ondansetron (ZOFRAN) IV, mouth rinse, oxyCODONE, polyethylene glycol, sodium chloride, sodium chloride flush, traMADol   Patient Profile  [name] is a 57 y.o. 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 Admitted with cardiogenic shock, stabilized and underwent HM3 LVAD. Post implant course c/b acute CVA.   Assessment/Plan  1.  Acute on chronic Systolic HF-->Cardiogenic Shock  - Diagnosed 11/2019. Presented with VT. LHC 70% LAD  - cMRI 12/21 concerning for sarcoid and EF 18%.  - PET 2/22 at [facility] EF 25% + active sarcoid - Echo 08/26/20 EF < 20% severely dilated LV RV mildly decreased.  - [employer] CRT-D upgrade in 06/08/21 - Echo 07/10/22: EF <20%, RV okay, mod pericardial effusion, mod MR/TR - Admitted 07/25 with cardiogenic shock. - RHC: Nonobstructive CAD, severely elevated filling pressures and low Fick CO/CI (2.7/1.4) - 08/13 HM III LVAD implant + clipping LAA d/t severe thickening and invagination of mitral valve annulus impeding flows.  - Apical core sent - no mention of sarcoid - Speed increased to 5300 on 08/14.  - Echo 8/26 mod-sev RV dysfunction. Speed increased to 5400 8/26 - RHC 8/27: low filing pressures with excellent cardiac output on EPI, PAPi 4.6 - Ramp Echo 9/4, speed increased to 5400  - MAPs 70-80s. Continue midodrine for now. Will not try to wean in setting of bacteremia - Volume status ok. Hold  diuretics - Continue digoxin 0.125 mg daily - Eventual CIR  2. HM-3 LVAD - VAD interrogated personally. Parameters stable. - LDH ok - INR 1.9. ASA 81 mg daily. Discussed dosing with PharmD personally.  3.  Acute stroke - Hx CVA 06/24 -Admitted 06/24 w/ R MCA stroke. S/p TPA and mechanical clot extraction. No residual deficits. Likely cardioembolic in setting of severe LV dysfunction. - Developed left sided weakness 08/14. CTA with R MCA infarct. Taken to IR for thrombectomy - Repeat CT head with small to moderate size hemorrhagic conversion.  -  repeat head CT on 8/17 w/ improved hemorrhagic CVA - Back on warfarin. INR 1.9  - neurology signed off 09/03/22.  - Continue PT/OT - Continue ASA 81 mg daily - Plan CIR   4. Enterococcus faecalis bacteremia - Bcx 2/2 on 9/6 - ID consult 9/6 -> ampicillin and ceftriaxone - TEE recommended but doubt it will change management as VAD cannot be removed and will require IV abx followed by long-term suppression - will get TTE on Monday to look for obvious vegetations - can pull PICC as needed  5. Hx VT - ln setting of potential sarcoid heart disease  - Off amio due to tremor. Continue mexiletine  - now s/p ICD.   6. CAD - LHC 12/07/19 70-% LAD, no intervention - LHC 8/24 non obstructive CAD.  - Continue statin. On aspirin for VAD. - No s/s angina  7. Cardiac sarcoid - PET 2/22 at [facility] EF 25% + active sarcoid - Has completed prednisone.  - holding methotrexate w/ recent surgery, timing to restart to be discussed with MD - apical core pathology not diagnostic of cardiac sarcoidosis.   8. 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 AKI - suspect cardiorenal, improved w/ inotropic support - Creatinine stable now   10. Iron deficiency anemia/ Post-op anemia - recent T sat 15%, scheduled for OP feraheme. Will complete inpatient  - Transfused 1 u RBCs 8/15 - Hgb ok   11. Pulmonary  - PFTs with severe obstructive defect, response to  bronchodilator. FEV1 1.04L, FEV1/FVC 48% - extubated 8/15  - Cultures 08/28 - NG - Stable  12. Depression - Improved   Arvilla Meres, MD  12:54 PM

## 2022-09-23 NOTE — Consult Note (Signed)
[facility]                                                                                        Patient Identification: Patient Name: Charles Holmes MRN: [id number] Admit Date: 08/10/2022 12:07 PM Today's Date: 09/23/2022 Reason for consult: Bacteremia Requesting provider: Riley Lam consult  Principal Problem:   Acute on chronic systolic CHF (congestive heart failure) (HCC) Active Problems:   HTN (hypertension)   AKI (acute kidney injury) (HCC)   CAD (coronary artery disease)   Ventricular tachycardia (HCC)   Cardiac sarcoidosis   History of CVA (cerebrovascular accident)   Paroxysmal atrial fibrillation (HCC)   Iron deficiency anemia   Chronic obstructive pulmonary disease (HCC)   Middle cerebral artery embolism, right   Antibiotics:  IV ampicillin 9/6-  Lines/Hardware:  Assessment # Nosocomial onset E faecalis bacteremia - 2/2 PICC ( removed 9/6), diarrhea ( resolved), r/o endocarditis as well as CIED  infection with PPM, LVAD+  Recommendations  Will switch abtx to IV ampicillin and IV ceftriaxone to empirically cover for endocarditis  Repeat 2 sets of blood cx tomorrow ordered  Will need TEE, EP to evaluate given presence of PPM Monitor CBC and CMP Following peripherally over the weekend.   Rest of the management as per the primary team. Please call with questions or concerns.  Thank you for the consult  __________________________________________________________________________________________________________ HPI and Hospital Course: 57 year old male with PMH of cardiac sarcoidosis (methotrexate on hold), COPD, CVA in June 2024, PPM for complete heart block, nonischemic cardiomyopathy and advanced CHF refractory to optimal medical therapy s/p HM 3 LVAD implant plus clipping left atrial appendage D/T severe thickening and invagination of mitral valve annulus impinging close  and 8/13 complicated by left-sided hemiplegia with CT head showing acute right MCA infarct status post IR for thrombectomy with repeat CT head with hemorrhagic conversion and requiring 2 hold anticoagulation who is consulted for E faecalis bacteremia. Admitted 06/24 w/ R MCA stroke. S/p TPA and mechanical clot extraction. No residual deficits.   Recently completed a prolonged course of antibiotics with EOT of vancomycin and meropenem on 8/28 fevers as well as leukocytosis.  Work up was negative for negative blood cultures on 8/16 and 8/28, negative tracheal aspirate cultures on 8/16, negative urine cultures on 8/16. He last had a PICC line removed from his right arm on 9/6. Reports having some diarrhea after his cardiac surgery which has since improved.    ROS: General- Denies fever, chills, loss of appetite and loss of weight HEENT - Denies headache, blurry vision, neck pain, sinus pain Chest - Denies any chest pain, SOB or cough CVS- Denies any dizziness/lightheadedness, syncopal attacks, palpitations Abdomen- Denies any nausea, vomiting, abdominal pain, hematochezia and diarrhea Neuro - Denies any weakness, numbness, tingling sensation Psych - Denies any changes in mood irritability or depressive symptoms GU- Denies any burning, dysuria, hematuria or increased frequency of urination Skin - denies any rashes/lesions MSK - denies any joint pain/swelling or restricted ROM   Past Medical History:  Diagnosis Date   CAD (coronary artery disease)    CHF (congestive heart failure) (HCC)  GERD (gastroesophageal reflux disease)    Hyperlipidemia    Hypertension    Systolic heart failure (HCC) 2021   LVEF 18%, RVEF 38% on cardiac MRI 12/19/2019. possible cardiac sarcoidosis.   Wide-complex tachycardia 2021   wears LifeVest   Past Surgical History:  Procedure Laterality Date   BIV UPGRADE N/A 06/07/2021   Procedure: BIV ICD UPGRADE;  Surgeon: Regan Lemming, MD;  Location: [facility] INVASIVE  CV LAB;  Service: Cardiovascular;  Laterality: N/A;   CLIPPING OF ATRIAL APPENDAGE Left 08/29/2022   Procedure: CLIPPING OF ATRIAL APPENDAGE;  Surgeon: Lovett Sox, MD;  Location: [facility] OR;  Service: Open Heart Surgery;  Laterality: Left;   IABP INSERTION N/A 08/25/2022   Procedure: IABP Insertion;  Surgeon: Dolores Patty, MD;  Location: [facility] INVASIVE CV LAB;  Service: Cardiovascular;  Laterality: N/A;   ICD IMPLANT N/A 02/20/2020   Procedure: ICD IMPLANT;  Surgeon: Regan Lemming, MD;  Location: [facility] INVASIVE CV LAB;  Service: Cardiovascular;  Laterality: N/A;   INSERTION OF IMPLANTABLE LEFT VENTRICULAR ASSIST DEVICE N/A 08/29/2022   Procedure: INSERTION OF IMPLANTABLE LEFT VENTRICULAR ASSIST DEVICE;  Surgeon: Lovett Sox, MD;  Location: [facility] OR;  Service: Open Heart Surgery;  Laterality: N/A;   IR CT HEAD LTD  07/10/2022   IR CT HEAD LTD  08/30/2022   IR PERCUTANEOUS ART THROMBECTOMY/INFUSION INTRACRANIAL INC DIAG ANGIO  07/10/2022   IR PERCUTANEOUS ART THROMBECTOMY/INFUSION INTRACRANIAL INC DIAG ANGIO  08/30/2022   IR US GUIDE VASC ACCESS LEFT  08/30/2022   IR US GUIDE VASC ACCESS RIGHT  07/10/2022   RADIOLOGY WITH ANESTHESIA N/A 07/10/2022   Procedure: RADIOLOGY WITH ANESTHESIA;  Surgeon: Radiologist, Medication, MD;  Location: [facility] OR;  Service: Radiology;  Laterality: N/A;   RADIOLOGY WITH ANESTHESIA N/A 08/30/2022   Procedure: IR WITH ANESTHESIA;  Surgeon: Julieanne Cotton, MD;  Location: [facility] OR;  Service: Radiology;  Laterality: N/A;   REMOVAL OF IMPELLA LEFT VENTRICULAR ASSIST DEVICE N/A 08/29/2022   Procedure: REMOVAL OF INTRA-AORTIC BALLON PUMP;  Surgeon: Lovett Sox, MD;  Location: [facility] OR;  Service: Open Heart Surgery;  Laterality: N/A;   RIGHT HEART CATH N/A 07/14/2022   Procedure: RIGHT HEART CATH;  Surgeon: Dolores Patty, MD;  Location: [facility] INVASIVE CV LAB;  Service: Cardiovascular;  Laterality: N/A;   RIGHT HEART CATH N/A 08/25/2022   Procedure: RIGHT HEART CATH;  Surgeon:  Dolores Patty, MD;  Location: [facility] INVASIVE CV LAB;  Service: Cardiovascular;  Laterality: N/A;   RIGHT HEART CATH N/A 09/12/2022   Procedure: RIGHT HEART CATH;  Surgeon: Laurey Morale, MD;  Location: [facility] INVASIVE CV LAB;  Service: Cardiovascular;  Laterality: N/A;   RIGHT/LEFT HEART CATH AND CORONARY ANGIOGRAPHY N/A 12/16/2019   Procedure: RIGHT/LEFT HEART CATH AND CORONARY ANGIOGRAPHY;  Surgeon: Swazil[place], Peter M, MD;  Location: [facility] INVASIVE CV LAB;  Service: Cardiovascular;  Laterality: N/A;   RIGHT/LEFT HEART CATH AND CORONARY ANGIOGRAPHY N/A 08/10/2022   Procedure: RIGHT/LEFT HEART CATH AND CORONARY ANGIOGRAPHY;  Surgeon: Dolores Patty, MD;  Location: [facility] INVASIVE CV LAB;  Service: Cardiovascular;  Laterality: N/A;   TEE WITHOUT CARDIOVERSION N/A 08/29/2022   Procedure: TRANSESOPHAGEAL ECHOCARDIOGRAM;  Surgeon: Lovett Sox, MD;  Location: [facility] OR;  Service: Open Heart Surgery;  Laterality: N/A;   TOOTH EXTRACTION N/A 08/22/2022   Procedure: DENTAL RESTORATION/EXTRACTIONS;  Surgeon: Ocie Doyne, DMD;  Location: [facility] OR;  Service: Oral Surgery;  Laterality: N/A;     Scheduled Meds:  sodium chloride   Intravenous Once  aspirin EC  81 mg Oral Daily   atorvastatin  80 mg Oral Daily   digoxin  0.125 mg Oral Daily   dorzolamide-timolol  1 drop Both Eyes BID   dronabinol  2.5 mg Oral BID AC   feeding supplement  237 mL Oral TID BM   Gerhardt's butt cream   Topical TID   insulin aspart  0-24 Units Subcutaneous TID WC & HS   latanoprost  1 drop Both Eyes QHS   melatonin  3 mg Oral QHS   mexiletine  250 mg Oral BID   midodrine  10 mg Oral Q8H   mirtazapine  7.5 mg Oral QHS   mometasone-formoterol  2 puff Inhalation BID   multivitamin with minerals  1 tablet Oral Daily   mouth rinse  15 mL Mouth Rinse 4 times per day   pantoprazole  40 mg Oral Daily   sertraline  50 mg Oral Daily   sildenafil  20 mg Oral TID   sodium chloride flush  3 mL Intravenous Q12H   umeclidinium bromide  1  puff Inhalation Daily   Warfarin - Pharmacist Dosing Inpatient   Does not apply q1600   Continuous Infusions:  sodium chloride Stopped (09/13/22 2035)   ampicillin (OMNIPEN) IV 2 g (09/23/22 0232)   PRN Meds:.sodium chloride, acetaminophen, albuterol, dextrose, ondansetron (ZOFRAN) IV, mouth rinse, oxyCODONE, polyethylene glycol, sodium chloride, sodium chloride flush, traMADol  Allergies  Allergen Reactions   Amiodarone     Severe tremors   Percocet [Oxycodone-Acetaminophen] Itching   Social History   Socioeconomic History   Marital status: Divorced    Spouse name: Not on file   Number of children: Not on file   Years of education: Not on file   Highest education level: Not on file  Occupational History   Not on file  Tobacco Use   Smoking status: Former    Types: Cigarettes   Smokeless tobacco: Never  Vaping Use   Vaping status: Unknown  Substance and Sexual Activity   Alcohol use: Yes    Alcohol/week: 6.0 standard drinks of alcohol    Types: 6 Cans of beer per week   Drug use: Yes    Types: Marijuana    Comment: stopped using months ago d/t how it affected breathing   Sexual activity: Yes  Other Topics Concern   Not on file  Social History Narrative   Not on file   Social Determinants of Health   Financial Resource Strain: Low Risk  (07/12/2022)   Overall Financial Resource Strain (CARDIA)    Difficulty of Paying Living Expenses: Not hard at all  Food Insecurity: No Food Insecurity (08/13/2022)   Hunger Vital Sign    Worried About Running Out of Food in the Last Year: Never true    Ran Out of Food in the Last Year: Never true  Transportation Needs: No Transportation Needs (08/13/2022)   PRAPARE - Administrator, Civil Service (Medical): No    Lack of Transportation (Non-Medical): No  Physical Activity: Not on file  Stress: Not on file  Social Connections: Not on file  Intimate Partner Violence: Not At Risk (08/13/2022)   Humiliation, Afraid,  Rape, and Kick questionnaire    Fear of Current or Ex-Partner: No    Emotionally Abused: No    Physically Abused: No    Sexually Abused: No   Family History  Problem Relation Age of Onset   Hypertension Father    Heart disease Father  Heart disease Mother    Vitals BP (!) 87/73 (BP Location: Left Arm)   Pulse 80   Temp 98.7 F (37.1 C) (Oral)   Resp 20   Ht 5\' 7"  (1.702 m)   Wt 77.4 kg   SpO2 93%   BMI 26.73 kg/m   Physical Exam Constitutional: Adult male lying in the bed and appears fatigued    Comments: HEENT WNL, no oral thrush  Cardiovascular:     Rate and Rhythm:     Heart sounds: LVAD hum, mechanical heart sounds  Pulmonary:     Effort: Pulmonary effort is normal.     Comments: Normal lung sounds  Abdominal:     Palpations: Abdomen is soft.     Tenderness: Nondistended and nontender, driveline site is bandaged C/D/I ( reported to be clean)  Musculoskeletal:        General: No swelling or tenderness in peripheral joints  Skin:    Comments: No rashes  Neurological:     General: Awake, alert and oriented, following commands, left-sided weakness  Psychiatric:        Mood and Affect: Mood normal.    Pertinent Microbiology Results for orders placed or performed during the hospital encounter of 08/10/22  MRSA Next Gen by PCR, Nasal     Status: None   Collection Time: 08/12/22  4:58 AM   Specimen: Nasal Mucosa; Nasal Swab  Result Value Ref Range Status   MRSA by PCR Next Gen NOT DETECTED NOT DETECTED Final    Comment: (NOTE) The GeneXpert MRSA Assay (FDA approved for NASAL specimens only), is one component of a comprehensive MRSA colonization surveillance program. It is not intended to diagnose MRSA infection nor to guide or monitor treatment for MRSA infections. Test performance is not FDA approved in patients less than 51 years old. Performed at [facility], [address] [place], [place] [address]   Surgical pcr screen     Status: None    Collection Time: 08/28/22 11:15 AM   Specimen: Nasal Mucosa; Nasal Swab  Result Value Ref Range Status   MRSA, PCR NEGATIVE NEGATIVE Final   Staphylococcus aureus NEGATIVE NEGATIVE Final    Comment: (NOTE) The Xpert SA Assay (FDA approved for NASAL specimens in patients 62 years of age and older), is one component of a comprehensive surveillance program. It is not intended to diagnose infection nor to guide or monitor treatment. Performed at [facility], [address], [place], [place] [address]   Culture, blood (Routine X 2) w Reflex to ID Panel     Status: None   Collection Time: 09/01/22  8:43 AM   Specimen: BLOOD  Result Value Ref Range Status   Specimen Description BLOOD  Final   Special Requests   Final    BOTTLES DRAWN AEROBIC ONLY Blood Culture results may not be optimal due to an inadequate volume of blood received in culture bottles   Culture   Final    NO GROWTH 5 DAYS Performed at [facility], [address] [place], [place] [address]    Report Status 09/06/2022 FINAL  Final  Culture, blood (Routine X 2) w Reflex to ID Panel     Status: None   Collection Time: 09/01/22  8:43 AM   Specimen: BLOOD  Result Value Ref Range Status   Specimen Description BLOOD  Final   Special Requests   Final    BOTTLES DRAWN AEROBIC AND ANAEROBIC Blood Culture adequate volume  Culture   Final    NO GROWTH 5 DAYS Performed at [facility], [address], [place], [place] [address]    Report Status 09/06/2022 FINAL  Final  Culture, Respiratory w Gram Stain     Status: None   Collection Time: 09/01/22  8:49 AM   Specimen: Tracheal Aspirate; Respiratory  Result Value Ref Range Status   Specimen Description TRACHEAL ASPIRATE  Final   Special Requests Immunocompromised  Final   Gram Stain   Final    ABUNDANT SQUAMOUS EPITHELIAL CELLS PRESENT ABUNDANT WBC PRESENT, PREDOMINANTLY PMN NO ORGANISMS SEEN    Culture   Final    NO GROWTH 2 DAYS Performed at [facility], [address] [place], [place] [address]    Report Status 09/04/2022 FINAL  Final  Urine Culture (for pregnant, neutropenic or urologic patients or patients with an indwelling urinary catheter)     Status: None   Collection Time: 09/01/22 12:03 PM   Specimen: Urine, Catheterized  Result Value Ref Range Status   Specimen Description URINE, CATHETERIZED  Final   Special Requests Immunocompromised  Final   Culture   Final    NO GROWTH Performed at [facility], [address], [place], [place] [address]    Report Status 09/02/2022 FINAL  Final  Culture, blood (Routine X 2) w Reflex to ID Panel     Status: None   Collection Time: 09/13/22  8:19 AM   Specimen: BLOOD LEFT HAND  Result Value Ref Range Status   Specimen Description BLOOD LEFT HAND  Final   Special Requests   Final    BOTTLES DRAWN AEROBIC AND ANAEROBIC Blood Culture adequate volume   Culture   Final    NO GROWTH 5 DAYS Performed at [facility], [address] [place], [place] [address]    Report Status 09/18/2022 FINAL  Final  Culture, blood (Routine X 2) w Reflex to ID Panel     Status: None   Collection Time: 09/13/22  8:20 AM   Specimen: BLOOD LEFT HAND  Result Value Ref Range Status   Specimen Description BLOOD LEFT HAND  Final   Special Requests   Final    BOTTLES DRAWN AEROBIC ONLY Blood Culture results may not be optimal due to an inadequate volume of blood received in culture bottles   Culture   Final    NO GROWTH 5 DAYS Performed at [facility], [address], [place], [place] [address]    Report Status 09/18/2022 FINAL  Final  Culture, blood (Routine X 2) w Reflex to ID Panel     Status: None (Preliminary result)   Collection Time: 09/22/22  8:54 AM   Specimen: BLOOD LEFT ARM  Result Value Ref Range Status   Specimen Description BLOOD LEFT ARM  Final   Special Requests   Final    BOTTLES DRAWN AEROBIC ONLY Blood Culture adequate volume   Culture  Setup Time   Final     GRAM POSITIVE COCCI AEROBIC BOTTLE ONLY CRITICAL RESULT CALLED TO, READ BACK BY AND VERIFIED WITH: PHARMD G. ABBOTT 09/23/22 @ 0117 BY AB Performed at [facility], [address] [place], [place] [address]    Culture [facility] POSITIVE COCCI  Final   Report Status PENDING  Incomplete  Blood Culture ID Panel (Reflexed)     Status: Abnormal   Collection Time: 09/22/22  8:54 AM  Result Value Ref Range Status  Enterococcus faecalis DETECTED (A) NOT DETECTED Final    Comment: CRITICAL RESULT CALLED TO, READ BACK BY AND VERIFIED WITH: PHARMD G. ABBOTT 09/23/22 @ 0117 BY AB    Enterococcus Faecium NOT DETECTED NOT DETECTED Final   Listeria monocytogenes NOT DETECTED NOT DETECTED Final   Staphylococcus species NOT DETECTED NOT DETECTED Final   Staphylococcus aureus (BCID) NOT DETECTED NOT DETECTED Final   Staphylococcus epidermidis NOT DETECTED NOT DETECTED Final   Staphylococcus lugdunensis NOT DETECTED NOT DETECTED Final   Streptococcus species NOT DETECTED NOT DETECTED Final   Streptococcus agalactiae NOT DETECTED NOT DETECTED Final   Streptococcus pneumoniae NOT DETECTED NOT DETECTED Final   Streptococcus pyogenes NOT DETECTED NOT DETECTED Final   A.calcoaceticus-baumannii NOT DETECTED NOT DETECTED Final   Bacteroides fragilis NOT DETECTED NOT DETECTED Final   Enterobacterales NOT DETECTED NOT DETECTED Final   Enterobacter cloacae complex NOT DETECTED NOT DETECTED Final   Escherichia coli NOT DETECTED NOT DETECTED Final   Klebsiella aerogenes NOT DETECTED NOT DETECTED Final   Klebsiella oxytoca NOT DETECTED NOT DETECTED Final   Klebsiella pneumoniae NOT DETECTED NOT DETECTED Final   Proteus species NOT DETECTED NOT DETECTED Final   Salmonella species NOT DETECTED NOT DETECTED Final   Serratia marcescens NOT DETECTED NOT DETECTED Final   Haemophilus influenzae NOT DETECTED NOT DETECTED Final   Neisseria meningitidis NOT DETECTED NOT DETECTED Final   Pseudomonas aeruginosa NOT  DETECTED NOT DETECTED Final   Stenotrophomonas maltophilia NOT DETECTED NOT DETECTED Final   Candida albicans NOT DETECTED NOT DETECTED Final   Candida auris NOT DETECTED NOT DETECTED Final   Candida glabrata NOT DETECTED NOT DETECTED Final   Candida krusei NOT DETECTED NOT DETECTED Final   Candida parapsilosis NOT DETECTED NOT DETECTED Final   Candida tropicalis NOT DETECTED NOT DETECTED Final   Cryptococcus neoformans/gattii NOT DETECTED NOT DETECTED Final   Vancomycin resistance NOT DETECTED NOT DETECTED Final    Comment: Performed at [facility], [address] [place], [place] [address]  Culture, blood (Routine X 2) w Reflex to ID Panel     Status: None (Preliminary result)   Collection Time: 09/22/22  8:55 AM   Specimen: BLOOD LEFT ARM  Result Value Ref Range Status   Specimen Description BLOOD LEFT ARM  Final   Special Requests   Final    BOTTLES DRAWN AEROBIC ONLY Blood Culture adequate volume   Culture  Setup Time   Final    GRAM POSITIVE COCCI AEROBIC BOTTLE ONLY CRITICAL VALUE NOTED.  VALUE IS CONSISTENT WITH PREVIOUSLY REPORTED AND CALLED VALUE. Performed at [facility], [address], [place], [place] [address]    Culture GRAM POSITIVE COCCI  Final   Report Status PENDING  Incomplete    Pertinent Lab seen by me:    Latest Ref Rng & Units 09/22/2022    9:19 AM 09/21/2022    4:46 AM 09/20/2022    4:09 AM  CBC  WBC 4.0 - 10.5 K/uL 13.1  10.8  10.5   Hemoglobin 13.0 - 17.0 g/dL 9.2  9.3  9.0   Hematocrit 39.0 - 52.0 % 30.8  31.5  30.3   Platelets 150 - 400 K/uL 400  416  456       Latest Ref Rng & Units 09/23/2022    2:24 AM 09/22/2022    3:30 AM 09/21/2022    4:46 AM  CMP  Glucose 70 - 99 mg/dL 82  86  74   BUN 6 -  20 mg/dL 20  25  24    Creatinine 0.61 - 1.24 mg/dL 1.61  0.96  0.45   Sodium 135 - 145 mmol/L 135  137  136   Potassium 3.5 - 5.1 mmol/L 3.9  4.5  4.2   Chloride 98 - 111 mmol/L 98  99  96   CO2 22 - 32 mmol/L 27  27  28    Calcium 8.9 -  10.3 mg/dL 8.7  8.8  8.9      Pertinent Imagings/Other Imagings Plain films and CT images have been personally visualized and interpreted; radiology reports have been reviewed. Decision making incorporated into the Impression / Recommendations.  DG CHEST PORT 1 VIEW  Result Date: 09/22/2022 CLINICAL DATA:  Fever and shortness of breath. EXAM: PORTABLE CHEST 1 VIEW COMPARISON:  Chest x-ray dated September 19, 2022. FINDINGS: Unchanged left chest wall pacemaker, LVAD, and right upper extremity PICC line. Stable cardiomegaly status post left atrial appendage clipping. Normal pulmonary vascularity. Unchanged retrocardiac opacity. Unchanged minimal linear atelectasis and/or scarring in the left upper lobe. No pneumothorax or large pleural effusion. No acute osseous abnormality. IMPRESSION: 1. Unchanged retrocardiac opacity, which may reflect atelectasis or pneumonia. Electronically Signed   By: Obie Dredge M.D.   On: 09/22/2022 10:39   ECHOCARDIOGRAM LIMITED  Result Date: 09/20/2022    ECHOCARDIOGRAM LIMITED REPORT   Patient Name:   Charles Holmes Date of Exam: 09/20/2022 Medical Rec #:  [id number]       Height:       67.0 in Accession #:    [id number]      Weight:       162.5 lb Date of Birth:  [birth year]       BSA:          1.851 m Patient Age:    57 years        BP:           94/71 mmHg Patient Gender: M               HR:           86 bpm. Exam Location:  Inpatient Procedure: Limited Echo and Limited Color Doppler Indications:    RAMP study for LVAD  History:        Patient has prior history of Echocardiogram examinations, most                 recent 09/11/2022. CHF and cardiac sarcoidosis, CAD, LVAD; Risk                 Factors:Hypertension and Dyslipidemia.  Sonographer:    Delcie Roch RDCS Referring Phys: [id number] DANIEL R BENSIMHON IMPRESSIONS  1. Windows are limiting. + inflow cannula. Left ventricular ejection fraction, by estimation, is <20%. The left ventricle has severely decreased function.  2.  Right ventricular systolic function is severely reduced. The right ventricular size is normal. Conclusion(s)/Recommendation(s): Unable to assess changes in LV with changes in LVAD speed. FINDINGS  Left Ventricle: Windows are limiting. + inflow cannula. Left ventricular ejection fraction, by estimation, is <20%. The left ventricle has severely decreased function. Right Ventricle: The right ventricular size is normal. Right ventricular systolic function is severely reduced. Tricuspid Valve: Tricuspid valve regurgitation is mild. Additional Comments: A device lead is visualized. Color Doppler performed.  PULMONIC VALVE PV Vmax:       0.46 m/s PV Vmean:      24.900 cm/s PV VTI:        0.059 m PV  Peak grad:  0.9 mmHg PV Mean grad:  0.0 mmHg  Carolan Clines Electronically signed by Carolan Clines Signature Date/Time: 09/20/2022/10:32:40 AM    Final    DG Chest Port 1 View  Result Date: 09/19/2022 CLINICAL DATA:  [id number].  LVAD in-situ. EXAM: PORTABLE CHEST 1 VIEW COMPARISON:  Portable chest 09/14/2022 FINDINGS: 4:47 a.m. Left chest dual lead pacing system with AID wiring is noted with stable wire insertions. Right PICC again terminates in the distal SVC. No pneumothorax. Stable partial visualization of the patient's LVAD. Interval removal feeding tube. Stable cardiomegaly and central vascular prominence without overt edema. Small left pleural effusion. There is increased patchy opacity in the left upper to mid lung periphery concerning for pneumonia. Persistent left lower lobe consolidation or atelectasis. Remaining lungs are clear. Chronic healed rib fractures are again noted on the right. Intact median sternotomy sutures.  Stable mediastinal configuration. IMPRESSION: 1. Increased patchy opacity in the left upper to mid lung periphery concerning for pneumonia. 2. Persistent left lower lobe consolidation or atelectasis. 3. Stable cardiomegaly and central vascular prominence without overt edema. 4. Stable support apparatus  except for interval removal of feeding tube. Electronically Signed   By: Almira Bar M.D.   On: 09/19/2022 07:41   DG Chest Port 1 View  Result Date: 09/14/2022 CLINICAL DATA:  LVAD EXAM: PORTABLE CHEST 1 VIEW COMPARISON:  09/12/2022 FINDINGS: Left AICD and LVAD remain in place, unchanged. Right PICC line is stable. Feeding tube is seen entering the stomach, stable. Distal aspect of the feeding tube not visualized on this chest x-ray. Prior median sternotomy. Mild cardiomegaly, vascular congestion. Left lower lobe atelectasis or infiltrate. Minimal right base atelectasis. No pneumothorax. IMPRESSION: Support devices stable. Bibasilar atelectasis or infiltrates, left greater than right. Electronically Signed   By: Charlett Nose M.D.   On: 09/14/2022 09:55   CARDIAC CATHETERIZATION  Result Date: 09/12/2022 1. Low filling pressures. 2. Excellent cardiac output on current support. 3. Good PAPi Will start weaning down on milrinone, hopefully will allow more room to wean epinephrine.   DG Chest Port 1 View  Result Date: 09/12/2022 CLINICAL DATA:  LVAD, history of CHF EXAM: PORTABLE CHEST 1 VIEW COMPARISON:  09/11/2022 FINDINGS: Left AICD and LVAD remain in place, unchanged. Right PICC line and feeding tube remain in place, unchanged. Cardiomegaly. Focal left lower lobe atelectasis or infiltrate, similar to prior study. Right lung clear. No visible effusions. IMPRESSION: Support devices stable. Left lower lobe atelectasis or infiltrate, similar to prior study. Electronically Signed   By: Charlett Nose M.D.   On: 09/12/2022 10:23   ECHOCARDIOGRAM LIMITED  Result Date: 09/11/2022    ECHOCARDIOGRAM LIMITED REPORT   Patient Name:   Charles Holmes Date of Exam: 09/11/2022 Medical Rec #:  [id number]       Height:       67.0 in Accession #:    [id number]      Weight:       172.4 lb Date of Birth:  1965/07/14       BSA:          1.899 m Patient Age:    57 years        BP:           0/0 mmHg Patient Gender: M                HR:           115 bpm. Exam Location:  Inpatient Procedure: Limited Echo Indications:  LAVD  History:        Patient has prior history of Echocardiogram examinations, most                 recent 09/04/2022. CAD.  Sonographer:    Darlys Gales Referring Phys: [id number] DALTON S MCLEAN IMPRESSIONS  1. LVAD at the apex. Left ventricular ejection fraction, by estimation, is 20 to 25%. The left ventricle has severely decreased function. The left ventricle demonstrates global hypokinesis.  2. Right ventricular systolic function is severely reduced. The right ventricular size is normal. A device lead is visualized.  3. Left atrial size was mildly dilated.  4. Right atrial size was mildly dilated.  5. The aortic valve was not well visualized. FINDINGS  Left Ventricle: LVAD at the apex. Left ventricular ejection fraction, by estimation, is 20 to 25%. The left ventricle has severely decreased function. The left ventricle demonstrates global hypokinesis. Right Ventricle: The right ventricular size is normal. Right ventricular systolic function is severely reduced. Left Atrium: Left atrial size was mildly dilated. Right Atrium: Right atrial size was mildly dilated. Aortic Valve: The aortic valve was not well visualized. Additional Comments: A device lead is visualized.  Armanda Magic MD Electronically signed by Armanda Magic MD Signature Date/Time: 09/11/2022/10:41:29 AM    Final    DG Chest Port 1 View  Result Date: 09/11/2022 CLINICAL DATA:  Congestive heart failure. Weakness. Left ventricular assist device in place. EXAM: PORTABLE CHEST 1 VIEW COMPARISON:  Radiographs 09/09/2022, 09/08/2022 and others. FINDINGS: 0525 hours. Mild patient rotation to the right. Stable cardiomegaly post median sternotomy and presumed CABG. Left subclavian AICD and left ventricular assist device are unchanged in position. Right arm PICC projects to the superior cavoatrial junction. Feeding tube projects below the diaphragm, tip not  visualized. Stable vascular congestion, small pleural effusions and bibasilar atelectasis. Old right-sided rib fractures appear unchanged. IMPRESSION: No significant change in appearance of the chest. Cardiomegaly, vascular congestion and small pleural effusions. Support system appears unchanged. Electronically Signed   By: Carey Bullocks M.D.   On: 09/11/2022 09:43   DG Chest Port 1 View  Result Date: 09/09/2022 CLINICAL DATA:  Left ventricular assist device present. EXAM: PORTABLE CHEST 1 VIEW COMPARISON:  One view chest x-ray 09/08/2022 FINDINGS: The heart is enlarged. Left ventricular assist device is stable. Pacing and AICD leads are stable. The small bore feeding tube courses off the inferior border the film. Right-sided PICC line is stable. Mild pulmonary vascular congestion is stable. Small effusions and bibasilar atelectasis are similar the prior study. Remote right-sided rib fractures are again noted. IMPRESSION: 1. Stable cardiomegaly and mild pulmonary vascular congestion. 2. Stable small effusions and bibasilar atelectasis. 3. Support apparatus is stable. Electronically Signed   By: Marin Roberts M.D.   On: 09/09/2022 10:16   DG CHEST PORT 1 VIEW  Result Date: 09/08/2022 CLINICAL DATA:  Acute on chronic systolic congestive heart failure. EXAM: PORTABLE CHEST 1 VIEW COMPARISON:  September 07, 2022. FINDINGS: Stable cardiomegaly. Left ventricular assist device is unchanged in position. Left-sided defibrillator is unchanged. Feeding tube is seen entering stomach. Right-sided PICC line is unchanged. Old right rib fractures are noted. Mild bibasilar subsegmental atelectasis is noted. IMPRESSION: Mild bibasilar subsegmental atelectasis is noted. Stable support apparatus. Electronically Signed   By: Lupita Raider M.D.   On: 09/08/2022 13:25   DG Chest Port 1 View  Result Date: 09/07/2022 CLINICAL DATA:  LVAD EXAM: PORTABLE CHEST 1 VIEW COMPARISON:  09/06/2022. FINDINGS: Enlarged cardiac  silhouette.  Mild vascular congestion. Left-sided small pleural effusion. No pneumothorax. There is a right-sided PICC tip overlies mid SVC. Left-sided pacer and left ventricular cyst devices. Left atrial clip. Median sternotomy wires. Feeding tube tip below the diaphragm and off the x-ray. IMPRESSION: Enlarged cardiac silhouette. Mild vascular congestion. Small left-sided pleural effusion. Electronically Signed   By: Layla Maw M.D.   On: 09/07/2022 10:14   DG Chest Port 1 View  Result Date: 09/06/2022 CLINICAL DATA:  Left ventricular assist device. EXAM: PORTABLE CHEST 1 VIEW COMPARISON:  September 05, 2022. FINDINGS: Stable cardiomegaly. Left ventricular assist device and feeding tube are unchanged. Left-sided defibrillator is unchanged. Right-sided PICC line is unchanged. No pneumothorax. Stable bibasilar atelectasis is noted possible pleural effusions. IMPRESSION: Stable support apparatus. Stable bibasilar opacities as noted above. Electronically Signed   By: Lupita Raider M.D.   On: 09/06/2022 10:00   DG Chest Port 1 View  Result Date: 09/05/2022 CLINICAL DATA:  History of LVAD EXAM: PORTABLE CHEST 1 VIEW COMPARISON:  Chest radiograph dated 09/04/2022 FINDINGS: Lines/tubes: Interval removal of PA catheter with right IJ sheath in-situ. Left chest wall ICD leads project over the right atrium and ventricle and tributary of the coronary sinus. LVAD in-situ. Enteric tube tip reaches the diaphragm and terminates below the field of view. Interval placement of right upper extremity PICC. Tip projects over the superior cavoatrial junction. Lungs: Bilateral interstitial opacities. Slightly increased dense retrocardiac opacities. Pleura: Possible small right pleural effusion. Left costophrenic angle is obscured. No pneumothorax. Heart/mediastinum: Similar enlarged postsurgical cardiomediastinal silhouette. Bones: Median sternotomy wires are nondisplaced. IMPRESSION: 1. Interval removal of PA catheter with  right IJ sheath in-situ. 2. Interval placement of right upper extremity PICC with tip projecting over the superior cavoatrial junction. 3. Slightly increased dense retrocardiac opacities, likely atelectasis. 4. Possible small right pleural effusion. Electronically Signed   By: Agustin Cree M.D.   On: 09/05/2022 08:42   ECHOCARDIOGRAM LIMITED  Result Date: 09/04/2022    ECHOCARDIOGRAM LIMITED REPORT   Patient Name:   Charles Holmes Date of Exam: 09/04/2022 Medical Rec #:  [id number]       Height:       67.0 in Accession #:    [id number]      Weight:       190.3 lb Date of Birth:  1965/07/19       BSA:          1.980 m Patient Age:    57 years        BP:           97/80 mmHg Patient Gender: M               HR:           104 bpm. Exam Location:  Inpatient Procedure: Limited Echo and Limited Color Doppler Indications:    CHF I50.9  History:        Patient has prior history of Echocardiogram examinations, most                 recent 08/24/2022. CHF, CAD, Stroke and COPD, Arrythmias:Atrial                 Fibrillation and Tachycardia; Risk Factors:Hypertension and                 Dyslipidemia.  Sonographer:    Lucendia Herrlich Referring Phys: [phone] PETER VANTRIGT IMPRESSIONS  1. LVAD at LV apex. No subcostal images.  2. Left ventricular ejection fraction,  by estimation, is <20%. The left ventricle has severely decreased function. The left ventricle demonstrates global hypokinesis. The left ventricular internal cavity size was severely dilated.  3. Right ventricular systolic function is severely reduced. The right ventricular size is normal.  4. Left atrial size was mildly dilated.  5. Right atrial size was mildly dilated.  6. The mitral valve is normal in structure. No evidence of mitral valve regurgitation. No evidence of mitral stenosis.  7. The aortic valve has an indeterminant number of cusps. Aortic valve regurgitation is not visualized. FINDINGS  Left Ventricle: Left ventricular ejection fraction, by estimation, is  <20%. The left ventricle has severely decreased function. The left ventricle demonstrates global hypokinesis. The left ventricular internal cavity size was severely dilated. There is no left ventricular hypertrophy. Right Ventricle: The right ventricular size is normal. Right ventricular systolic function is severely reduced. Left Atrium: Left atrial size was mildly dilated. Right Atrium: Right atrial size was mildly dilated. Pericardium: There is no evidence of pericardial effusion. Mitral Valve: The mitral valve is normal in structure. No evidence of mitral valve stenosis. Tricuspid Valve: The tricuspid valve is normal in structure. Tricuspid valve regurgitation is mild . No evidence of tricuspid stenosis. Aortic Valve: The aortic valve has an indeterminant number of cusps. Aortic valve regurgitation is not visualized. Pulmonic Valve: The pulmonic valve was not well visualized. Aorta: The aortic root is normal in size and structure. Additional Comments: LVAD at LV apex. No subcostal images. A venous catheter is visualized.  RIGHT VENTRICLE RV S prime:     7.77 cm/s TRICUSPID VALVE TR Peak grad:   40.7 mmHg TR Vmax:        319.00 cm/s Olga Millers MD Electronically signed by Olga Millers MD Signature Date/Time: 09/04/2022/12:45:59 PM    Final    DG Chest Port 1 View  Result Date: 09/04/2022 CLINICAL DATA:  [id number] LVAD (left ventricular assist device) present [facility]) [id number] EXAM: PORTABLE CHEST 1 VIEW COMPARISON:  09/03/2022 FINDINGS: Right IJ approach pulmonary arterial catheter terminates within the central pulmonary outflow. Feeding tube courses below the diaphragm. Unchanged positioning of left ventricular assist device. Pacemaker in place. Stable cardiomegaly status post sternotomy. Bilateral chest tubes have been removed. Mild interstitial prominence bilaterally with probable trace bilateral pleural effusions. Bibasilar atelectasis. No pneumothorax. IMPRESSION: 1. Interval removal of bilateral chest tubes.  No pneumothorax. 2. Mild interstitial prominence bilaterally with probable trace bilateral pleural effusions. Electronically Signed   By: Duanne Guess D.O.   On: 09/04/2022 10:22   [place] EKG SITE RITE  Result Date: 09/04/2022 If Site Rite image not attached, placement could not be confirmed due to current cardiac rhythm.  DG Chest Port 1 View  Result Date: 09/03/2022 CLINICAL DATA:  LVAD present EXAM: PORTABLE CHEST 1 VIEW COMPARISON:  09/02/2022 FINDINGS: Bilateral chest tubes again noted. Feeding tube coursing below the diaphragm. Swan-Ganz catheter with the tip projecting over the right ventricular outflow tract. Left ventricular assist device again noted in unchanged position. Multi lead cardiac pacemaker. Small bilateral pleural effusions. Bilateral mild interstitial thickening. Right basilar atelectasis. Stable cardiomegaly. Prior median sternotomy. No acute osseous abnormality. IMPRESSION: 1. Cardiomegaly with mild pulmonary vascular congestion. 2. Small bilateral pleural effusions. 3. Support lines and tubing in satisfactory position. Electronically Signed   By: Elige Ko M.D.   On: 09/03/2022 10:04   CT HEAD WO CONTRAST ( )  Result Date: 09/02/2022 CLINICAL DATA:  Stroke, follow up signs of cerebral hemorrhage. EXAM: CT HEAD WITHOUT CONTRAST TECHNIQUE: Contiguous axial  images were obtained from the base of the skull through the vertex without intravenous contrast. RADIATION DOSE REDUCTION: This exam was performed according to the departmental dose-optimization program which includes automated exposure control, adjustment of the mA and/or kV according to patient size and/or use of iterative reconstruction technique. COMPARISON:  Head CT 08/31/2022. 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 No new loss of gray-white differentiation. No hydrocephalus or midline shift. Basilar cisterns are patent.  Vascular: No hyperdense vessel or unexpected calcification. Skull: No calvarial fracture or suspicious bone lesion. Skull base is unremarkable. Sinuses/Orbits: No acute findings. Other: None. 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 No hydrocephalus or midline shift. Electronically Signed   By: Orvan Falconer M.D.   On: 09/02/2022 11:15   DG Chest Port 1 View  Result Date: 09/02/2022 CLINICAL DATA:  Left ventricular assist device. EXAM: PORTABLE CHEST 1 VIEW COMPARISON:  09/01/2022 FINDINGS: Left-sided pacemaker unchanged. Left ventricular assist device projects over the left heart border unchanged. Enteric tube courses through the region of the stomach and off the film as tip is not visualized. Right IJ Swan-Ganz catheter has tip over the proximal right pulmonary artery in the midline. Bilateral chest tubes unchanged. Lungs are adequately inflated without lobar consolidation or effusion. No pneumothorax. Minimal prominence of the central pulmonary vessels which may be due to minimal vascular congestion. Cardiomediastinal silhouette and remainder of the exam is unchanged. Old right-sided rib fractures. IMPRESSION: 1. Possible minimal vascular congestion, otherwise no acute cardiopulmonary disease. 2. Support apparatus as described. Electronically Signed   By: Elberta Fortis M.D.   On: 09/02/2022 10:04   DG Abd Portable 1V  Result Date: 09/01/2022 CLINICAL DATA:  Feeding tube placement EXAM: PORTABLE ABDOMEN - 1 VIEW COMPARISON:  CT abdomen/pelvis 08/11/2022 FINDINGS: The enteric catheter tip is in the stomach. There is a nonobstructive bowel gas pattern in the imaged abdomen. There is no definite pneumoperitoneum. Support apparatus in the chest appears stable. IMPRESSION: Enteric catheter tip in the stomach. Electronically Signed   By: Lesia Hausen M.D.   On: 09/01/2022 13:56   DG Chest Port 1 View  Result Date:  09/01/2022 CLINICAL DATA:  History of LVAD EXAM: PORTABLE CHEST 1 VIEW COMPARISON:  Chest radiograph dated 08/31/2022 FINDINGS: Lines/tubes: Interval extubation and removal of enteric catheter. Right IJ PA catheter tip is obscured by the median sternotomy wire. Similar position of bilateral pleural catheters. Left chest wall ICD leads project over the right atrium and ventricle. Similar position of LVAD. Lungs: Persistent bilateral patchy and linear opacities. Pleura: No pneumothorax or pleural effusion. Heart/mediastinum: Similar mildly enlarged cardiomediastinal silhouette. Bones: Median sternotomy wires are nondisplaced. IMPRESSION: 1. Interval extubation and removal of enteric catheter. 2. Persistent bilateral patchy and linear opacities, likely atelectasis. 3. Similar cardiomegaly. Electronically Signed   By: Agustin Cree M.D.   On: 09/01/2022 08:28   DG Chest Port 1 View  Result Date: 08/31/2022 CLINICAL DATA:  Left ventricular assist device.  Follow-. EXAM: PORTABLE CHEST 1 VIEW COMPARISON:  Radiographs 08/30/2022 and 08/29/2022.  CT 08/11/2022. FINDINGS: 0558 hours. The endotracheal tube, enteric tube, right IJ Swan-Ganz catheter, bilateral chest tubes, left subclavian AICD leads and the left ventricular assist device appear unchanged in position. There is stable cardiomegaly post median sternotomy. Little change in the appearance of the lungs with mild bibasilar atelectasis and vascular congestion. No pneumothorax or acute osseous abnormality identified. There are old right-sided  rib fractures. IMPRESSION: Stable postoperative chest. Electronically Signed   By: Carey Bullocks M.D.   On: 08/31/2022 10:30   IR PERCUTANEOUS ART THROMBECTOMY/INFUSION INTRACRANIAL INC DIAG ANGIO  Result Date: 08/31/2022 INDICATION: Acute onset of left sided weakness. 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 EXAM: 1. EMERGENT LARGE VESSEL OCCLUSION THROMBOLYSIS (anterior CIRCULATION) COMPARISON:  CT angiogram of  the head and neck of August 30, 2022. MEDICATIONS: No antibiotic was administered within 1 hour of the procedure. ANESTHESIA/SEDATION: General anesthesia. CONTRAST:  Omnipaque 300 approximately 50 cc. FLUOROSCOPY TIME:  Fluoroscopy Time: 12 minutes 54 seconds (817 mGy). COMPLICATIONS: None immediate. TECHNIQUE: Following a full explanation of the procedure along with the potential associated complications, an informed witnessed consent was obtained. The risks of intracranial hemorrhage of 10%, worsening neurological deficit, ventilator dependency, death and inability to revascularize were all reviewed in detail with the patient's daughter. The patient was then put under general anesthesia by the Department of Anesthesiology at [facility]. The left groin was prepped and draped in the usual sterile fashion. Thereafter using modified Seldinger technique, transfemoral access into the right common femoral artery was obtained without difficulty. Over an 0.035 inch guidewire an 8 French 25 cm Pinnacle sheath was inserted. Through this, and also over an 0.035 inch guidewire a combination of a 5.5 [place] 125 cm Berenstein support catheter inside of a 087 95 cm balloon guide catheter was advanced to the aortic arch region, and advanced initially to the proximal right internal carotid artery and then more distally into the right internal carotid artery. FINDINGS: The proximal right internal carotid artery demonstrates the right internal carotid artery at the bulb to the cranial skull base to be widely patent. The petrous, the cavernous and the supraclinoid right ICA are widely patent. A prominent right posterior communicating artery is seen opacifying the right posterior cerebral artery distribution. The right middle cerebral artery is occluded at its origin. The right anterior cerebral artery opacifies into the capillary and venous phases. PROCEDURE: Through the balloon guide catheter in the distal cervical right  ICA, a combination of an 071 132 cm Zoom aspiration catheter and an 021 162 cm Phenom microcatheter was advanced over an 018 inch micro guidewire with a moderate J configuration to the supraclinoid right ICA. Using a torque device, the micro guidewire was teased through firm clot into the inferior division distal M2 segment followed by the microcatheter. The guidewire was removed. Good aspiration was obtained from the hub of the microcatheter. A gentle control arteriogram performed through the microcatheter demonstrated safe positioning of the tip of the microcatheter, which was then connected to continuous heparinized saline infusion. A 4 mm x 40 mm Solitaire X retrieval device was advanced and positioned such that the proximal portion was at the origin of the right middle cerebral artery. The 071 Zoom aspiration catheter was advanced into the occluded segment of the right middle cerebral artery M1 segment. Thereafter, constant aspiration was applied at the hub of the Zoom aspiration catheter with an aspiration pump, as aspiration was also performed at the hub of the balloon guide catheter with a 20 mL syringe for approximately a minute and a half. Thereafter, with proximal flow arrest, the combination of the retrieval device, the Zoom aspiration catheter, and the micro guidewire were removed. Following reversal of flow arrest, a control arteriogram performed through the balloon guide in the distal right ICA demonstrates complete revascularization of the right middle cerebral artery achieving a TICI 3  revascularization. The right anterior cerebral artery and the right posterior communicating artery continued to maintain complete revascularization. Final control arteriogram performed through the balloon guide catheter in the right common carotid artery demonstrate wide patency of the right internal carotid artery extra cranially and intracranially with no change in the completely revascularized right middle cerebral  artery with a TICI 3 revascularization. The balloon guide was removed. An 8 French Angio-Seal closure device was deployed at the left groin puncture site. Distal pulses revealed right DP and PT, with a Dopplerable left PT without a Dopplerable left DP. Flat panel CT of the brain demonstrated contrast stain in the putamen, and the caudate heads with suggestion of hyper density in the occipital horn of the right lateral ventricle possibly representing contrast plus/minus hemorrhage. Patient was left intubated for airway protection, and transferred back to 2H in stable condition. IMPRESSION: Status post complete revascularization of occluded right middle cerebral M1 segment with 1 pass with a 4 mm x 40 mm Solitaire X retrieval device, and contact aspiration described achieving a TICI 3 revascularization. PLAN: As per referring stroke neurology service. Electronically Signed   By: Julieanne Cotton M.D.   On: 08/31/2022 09:46   IR CT Head Ltd  Result Date: 08/31/2022 INDICATION: Acute onset of left sided weakness. 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 EXAM: 1. EMERGENT LARGE VESSEL OCCLUSION THROMBOLYSIS (anterior CIRCULATION) COMPARISON:  CT angiogram of the head and neck of August 30, 2022. MEDICATIONS: No antibiotic was administered within 1 hour of the procedure. ANESTHESIA/SEDATION: General anesthesia. CONTRAST:  Omnipaque 300 approximately 50 cc. FLUOROSCOPY TIME:  Fluoroscopy Time: 12 minutes 54 seconds (817 mGy). COMPLICATIONS: None immediate. TECHNIQUE: Following a full explanation of the procedure along with the potential associated complications, an informed witnessed consent was obtained. The risks of intracranial hemorrhage of 10%, worsening neurological deficit, ventilator dependency, death and inability to revascularize were all reviewed in detail with the patient's daughter. The patient was then put under general anesthesia by the Department of Anesthesiology at [facility].  The left groin was prepped and draped in the usual sterile fashion. Thereafter using modified Seldinger technique, transfemoral access into the right common femoral artery was obtained without difficulty. Over an 0.035 inch guidewire an 8 French 25 cm Pinnacle sheath was inserted. Through this, and also over an 0.035 inch guidewire a combination of a 5.5 [place] 125 cm Berenstein support catheter inside of a 087 95 cm balloon guide catheter was advanced to the aortic arch region, and advanced initially to the proximal right internal carotid artery and then more distally into the right internal carotid artery. FINDINGS: The proximal right internal carotid artery demonstrates the right internal carotid artery at the bulb to the cranial skull base to be widely patent. The petrous, the cavernous and the supraclinoid right ICA are widely patent. A prominent right posterior communicating artery is seen opacifying the right posterior cerebral artery distribution. The right middle cerebral artery is occluded at its origin. The right anterior cerebral artery opacifies into the capillary and venous phases. PROCEDURE: Through the balloon guide catheter in the distal cervical right ICA, a combination of an 071 132 cm Zoom aspiration catheter and an 021 162 cm Phenom microcatheter was advanced over an 018 inch micro guidewire with a moderate J configuration to the supraclinoid right ICA. Using a torque device, the micro guidewire was teased through firm clot into the inferior division distal M2 segment followed by the microcatheter. The guidewire was  removed. Good aspiration was obtained from the hub of the microcatheter. A gentle control arteriogram performed through the microcatheter demonstrated safe positioning of the tip of the microcatheter, which was then connected to continuous heparinized saline infusion. A 4 mm x 40 mm Solitaire X retrieval device was advanced and positioned such that the proximal portion was at the  origin of the right middle cerebral artery. The 071 Zoom aspiration catheter was advanced into the occluded segment of the right middle cerebral artery M1 segment. Thereafter, constant aspiration was applied at the hub of the Zoom aspiration catheter with an aspiration pump, as aspiration was also performed at the hub of the balloon guide catheter with a 20 mL syringe for approximately a minute and a half. Thereafter, with proximal flow arrest, the combination of the retrieval device, the Zoom aspiration catheter, and the micro guidewire were removed. Following reversal of flow arrest, a control arteriogram performed through the balloon guide in the distal right ICA demonstrates complete revascularization of the right middle cerebral artery achieving a TICI 3 revascularization. The right anterior cerebral artery and the right posterior communicating artery continued to maintain complete revascularization. Final control arteriogram performed through the balloon guide catheter in the right common carotid artery demonstrate wide patency of the right internal carotid artery extra cranially and intracranially with no change in the completely revascularized right middle cerebral artery with a TICI 3 revascularization. The balloon guide was removed. An 8 French Angio-Seal closure device was deployed at the left groin puncture site. Distal pulses revealed right DP and PT, with a Dopplerable left PT without a Dopplerable left DP. Flat panel CT of the brain demonstrated contrast stain in the putamen, and the caudate heads with suggestion of hyper density in the occipital horn of the right lateral ventricle possibly representing contrast plus/minus hemorrhage. Patient was left intubated for airway protection, and transferred back to 2H in stable condition. IMPRESSION: Status post complete revascularization of occluded right middle cerebral M1 segment with 1 pass with a 4 mm x 40 mm Solitaire X retrieval device, and contact  aspiration described achieving a TICI 3 revascularization. PLAN: As per referring stroke neurology service. Electronically Signed   By: Julieanne Cotton M.D.   On: 08/31/2022 09:46   IR US Guide Vasc Access Left  Result Date: 08/31/2022 INDICATION: Acute onset of left sided weakness. 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 EXAM: 1. EMERGENT LARGE VESSEL OCCLUSION THROMBOLYSIS (anterior CIRCULATION) COMPARISON:  CT angiogram of the head and neck of August 30, 2022. MEDICATIONS: No antibiotic was administered within 1 hour of the procedure. ANESTHESIA/SEDATION: General anesthesia. CONTRAST:  Omnipaque 300 approximately 50 cc. FLUOROSCOPY TIME:  Fluoroscopy Time: 12 minutes 54 seconds (817 mGy). COMPLICATIONS: None immediate. TECHNIQUE: Following a full explanation of the procedure along with the potential associated complications, an informed witnessed consent was obtained. The risks of intracranial hemorrhage of 10%, worsening neurological deficit, ventilator dependency, death and inability to revascularize were all reviewed in detail with the patient's daughter. The patient was then put under general anesthesia by the Department of Anesthesiology at [facility]. The left groin was prepped and draped in the usual sterile fashion. Thereafter using modified Seldinger technique, transfemoral access into the right common femoral artery was obtained without difficulty. Over an 0.035 inch guidewire an 8 French 25 cm Pinnacle sheath was inserted. Through this, and also over an 0.035 inch guidewire a combination of a 5.5 French 125 cm Berenstein support catheter inside of  a 087 95 cm balloon guide catheter was advanced to the aortic arch region, and advanced initially to the proximal right internal carotid artery and then more distally into the right internal carotid artery. FINDINGS: The proximal right internal carotid artery demonstrates the right internal carotid artery at the bulb to the cranial  skull base to be widely patent. The petrous, the cavernous and the supraclinoid right ICA are widely patent. A prominent right posterior communicating artery is seen opacifying the right posterior cerebral artery distribution. The right middle cerebral artery is occluded at its origin. The right anterior cerebral artery opacifies into the capillary and venous phases. PROCEDURE: Through the balloon guide catheter in the distal cervical right ICA, a combination of an 071 132 cm Zoom aspiration catheter and an 021 162 cm Phenom microcatheter was advanced over an 018 inch micro guidewire with a moderate J configuration to the supraclinoid right ICA. Using a torque device, the micro guidewire was teased through firm clot into the inferior division distal M2 segment followed by the microcatheter. The guidewire was removed. Good aspiration was obtained from the hub of the microcatheter. A gentle control arteriogram performed through the microcatheter demonstrated safe positioning of the tip of the microcatheter, which was then connected to continuous heparinized saline infusion. A 4 mm x 40 mm Solitaire X retrieval device was advanced and positioned such that the proximal portion was at the origin of the right middle cerebral artery. The 071 Zoom aspiration catheter was advanced into the occluded segment of the right middle cerebral artery M1 segment. Thereafter, constant aspiration was applied at the hub of the Zoom aspiration catheter with an aspiration pump, as aspiration was also performed at the hub of the balloon guide catheter with a 20 mL syringe for approximately a minute and a half. Thereafter, with proximal flow arrest, the combination of the retrieval device, the Zoom aspiration catheter, and the micro guidewire were removed. Following reversal of flow arrest, a control arteriogram performed through the balloon guide in the distal right ICA demonstrates complete revascularization of the right middle cerebral  artery achieving a TICI 3 revascularization. The right anterior cerebral artery and the right posterior communicating artery continued to maintain complete revascularization. Final control arteriogram performed through the balloon guide catheter in the right common carotid artery demonstrate wide patency of the right internal carotid artery extra cranially and intracranially with no change in the completely revascularized right middle cerebral artery with a TICI 3 revascularization. The balloon guide was removed. An 8 French Angio-Seal closure device was deployed at the left groin puncture site. Distal pulses revealed right DP and PT, with a Dopplerable left PT without a Dopplerable left DP. Flat panel CT of the brain demonstrated contrast stain in the putamen, and the caudate heads with suggestion of hyper density in the occipital horn of the right lateral ventricle possibly representing contrast plus/minus hemorrhage. Patient was left intubated for airway protection, and transferred back to 2H in stable condition. IMPRESSION: Status post complete revascularization of occluded right middle cerebral M1 segment with 1 pass with a 4 mm x 40 mm Solitaire X retrieval device, and contact aspiration described achieving a TICI 3 revascularization. PLAN: As per referring stroke neurology service. Electronically Signed   By: Julieanne Cotton M.D.   On: 08/31/2022 09:46   CT HEAD WO CONTRAST ( )  Result Date: 08/31/2022 CLINICAL DATA:  Stroke follow-up EXAM: CT HEAD WITHOUT CONTRAST TECHNIQUE: Contiguous axial images were obtained from the base of the skull through the  vertex without intravenous contrast. RADIATION DOSE REDUCTION: This exam was performed according to the departmental dose-optimization program which includes automated exposure control, adjustment of the mA and/or kV according to patient size and/or use of iterative reconstruction technique. COMPARISON:  Yesterday FINDINGS: Brain: Hematoma at the right  caudate head with mild intraventricular extension is unchanged. Hazy high density at the right putamen show some fading, residual attributed to petechial blood. The extent of acute infarct in the right temporal cortex and right basal ganglia appears non progressed. No hydrocephalus. Vascular: No hyperdense vessel or unexpected calcification. Skull: Normal. Negative for fracture or focal lesion. Sinuses/Orbits: No acute finding. IMPRESSION: Unchanged extent of infarct and hemorrhage in the right MCA distribution including small volume intraventricular clot. No hydrocephalus. Electronically Signed   By: Tiburcio Pea M.D.   On: 08/31/2022 06:02   CT HEAD WO CONTRAST ( )  Result Date: 08/30/2022 CLINICAL DATA:  Stroke, follow up left sided weakness. EXAM: CT HEAD WITHOUT CONTRAST TECHNIQUE: Contiguous axial images were obtained from the base of the skull through the vertex without intravenous contrast. RADIATION DOSE REDUCTION: This exam was performed according to the departmental dose-optimization program which includes automated exposure control, adjustment of the mA and/or kV according to patient size and/or use of iterative reconstruction technique. COMPARISON:  Head CT 08/30/2022. FINDINGS: Brain: Hemorrhagic transformation of evolving infarct in the right caudate nucleus with small volume intraventricular extension of hemorrhage. No acute hydrocephalus. New hyperdensity within the lentiform nucleus may reflect petechial hemorrhage versus contrast staining. Surrounding hypoattenuation in the right internal capsule and corona radiata is favored to reflect evolving infarct. Unchanged old infarct in the right anterior temporal lobe. No extra-axial collection, significant mass effect or midline shift. Vascular: No hyperdense vessel or unexpected calcification. Skull: No calvarial fracture or suspicious bone lesion. Skull base is unremarkable. Sinuses/Orbits: No acute findings. Other: None. IMPRESSION: 1.  Hemorrhagic transformation of evolving infarct in the right caudate nucleus with small volume intraventricular extension of hemorrhage. No acute hydrocephalus. 2. New hyperdensity within the right lentiform nucleus may reflect petechial hemorrhage versus contrast staining. 3. Surrounding hypoattenuation in the right internal capsule and corona radiata is favored to reflect evolving infarct. Critical Value/emergent results were relayed via secure text page at the time of interpretation on 08/30/2022 at 4:32 pm to provider Dr. Selina Cooley, who acknowledged these results. Electronically Signed   By: Orvan Falconer M.D.   On: 08/30/2022 16:42   DG Chest Port 1 View  Result Date: 08/30/2022 CLINICAL DATA:  Left ventricular assist device.  Follow-. EXAM: PORTABLE CHEST 1 VIEW COMPARISON:  Radiographs 08/29/2022 and 08/28/2022.  CT 08/11/2022. FINDINGS: 0924 hours. No significant change in position of the endotracheal tube, enteric tube, right IJ Swan-Ganz catheter and bilateral chest tubes. Left subclavian AICD leads are unchanged. The left ventricular assist device appears unchanged in position. The heart size and mediastinal contours are stable with mild cardiomegaly. Stable mild atelectasis at both lung bases. No evidence of pneumothorax, significant pleural effusion or acute osseous abnormality. IMPRESSION: Stable postoperative chest with unchanged support system and mild bibasilar atelectasis. No pneumothorax or significant pleural effusion. Electronically Signed   By: Carey Bullocks M.D.   On: 08/30/2022 13:27   CT ANGIO HEAD NECK W WO CM W PERF (CODE STROKE)  Result Date: 08/30/2022 CLINICAL DATA:  Left-sided weakness. EXAM: CT ANGIOGRAPHY HEAD AND NECK CT PERFUSION BRAIN TECHNIQUE: Multidetector CT imaging of the head and neck was performed using the standard protocol during bolus administration of intravenous contrast. Multiplanar CT  image reconstructions and MIPs were obtained to evaluate the vascular  anatomy. Carotid stenosis measurements (when applicable) are obtained utilizing NASCET criteria, using the distal internal carotid diameter as the denominator. Multiphase CT imaging of the brain was performed following IV bolus contrast injection. Subsequent parametric perfusion maps were calculated using RAPID software. RADIATION DOSE REDUCTION: This exam was performed according to the departmental dose-optimization program which includes automated exposure control, adjustment of the mA and/or kV according to patient size and/or use of iterative reconstruction technique. CONTRAST:  OMNIPAQUE IOHEXOL 350 MG/ML SOLN COMPARISON:  Noncontrast head CT from earlier today. FINDINGS: CTA NECK FINDINGS Aortic arch: Changes of recent cardiopulmonary bypass. No acute finding including dissection. Right carotid system: Mild atheromatous plaque at the bifurcation. No stenosis or ulceration. Left carotid system: Vessels are smoothly contoured and widely patent. Vertebral arteries: No proximal subclavian stenosis. Left dominant vertebral artery. Vessels are smoothly contoured and diffusely patent Skeleton: No acute finding Other neck: Unremarkable hardware traversing the neck. No acute finding Upper chest: Changes of recent median sternotomy with chest tubes and small bilateral pneumothorax. Review of the MIP images confirms the above findings CTA HEAD FINDINGS Anterior circulation: Right M1 occlusion with luminal clot appearance. There is some downstream branch reconstitution with underfilling. No additional embolus is noted. Negative for aneurysm. Posterior circulation: Strong left vertebral artery dominance. The right vertebral artery ends in the PICA. Fetal type right PCA flow. No branch occlusion, beading, or aneurysm Venous sinuses: Diffusely patent Anatomic variants: None significant Review of the MIP images confirms the above findings CT Brain Perfusion Findings: ASPECTS: 7 CBF (<30%) Volume: 12mL Perfusion  (Tmax>6.0s) volume: Mismatch Volume: 90mL Infarction Location:In the right MCA distribution, especially at the basal ganglia. The core infarct is somewhat underestimated relative to the aspects score especially at the temporal cortex. Neurology is already aware of the findings as confirmed in epic chat. IMPRESSION: 1. 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 2. Core infarct of 12 cc (somewhat underestimated compared to aspects) with 90 cc of penumbra. 3. Mild atherosclerosis. Electronically Signed   By: Tiburcio Pea M.D.   On: 08/30/2022 06:24   CT HEAD CODE STROKE WO CONTRAST  Result Date: 08/30/2022 CLINICAL DATA:  Code stroke.  Left-sided weakness EXAM: CT HEAD WITHOUT CONTRAST TECHNIQUE: Contiguous axial images were obtained from the base of the skull through the vertex without intravenous contrast. RADIATION DOSE REDUCTION: This exam was performed according to the departmental dose-optimization program which includes automated exposure control, adjustment of the mA and/or kV according to patient size and/or use of iterative reconstruction technique. COMPARISON:  07/11/2022 FINDINGS: Brain: Cytotoxic edema in the lateral right temporal cortex, putamen, and caudate head. No acute hemorrhage. No hydrocephalus or masslike finding. Vascular: No hyperdense vessel or unexpected calcification. Skull: Normal. Negative for fracture or focal lesion. Sinuses/Orbits: No acute finding. Other: These results were communicated to Dr. Derry Lory at 5:37 am on 08/30/2022 by epic chat. ASPECTS [facility] Stroke Program Early CT Score) - Ganglionic level infarction (caudate, lentiform nuclei, internal capsule, insula, M1-M3 cortex): 4 - Supraganglionic infarction (M4-M6 cortex): 3 Total score (0-10 with 10 being normal): 7 IMPRESSION: 1. 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 ASPECTS is 7. 2. No acute hemorrhage. Electronically Signed   By: Tiburcio Pea M.D.   On: 08/30/2022  05:38   ECHO INTRAOPERATIVE TEE  Result Date: 08/29/2022  *INTRAOPERATIVE TRANSESOPHAGEAL REPORT *  Patient Name:   Charles Holmes Date of Exam: 08/29/2022  Medical Rec #:  [id number]       Height:       67.0 in Accession #:    [id number]      Weight:       169.3 lb Date of Birth:  1965-05-02       BSA:          1.88 m Patient Age:    57 years        BP:           140/50 mmHg Patient Gender: M               HR:           75 bpm. Exam Location:  Inpatient Transesophogeal exam was perform intraoperatively during surgical procedure. Patient was closely monitored under general anesthesia during the entirety of examination. Indications:     Cardiac surgery, Left ventricular assist device placement. Performing Phys: [id number] PETER VANTRIGT Complications: No known complications during this procedure. POST-OP IMPRESSIONS _ Right Ventricle: moderately reduced function. _ Aorta: The aorta appears unchanged from pre-bypass. _ Left Atrial Appendage: The left atrial appendage appears unchanged from pre-bypass. _ Aortic Valve: There is mild aortic insufficiency post bypass. The aortic valve is not visualized opening. _ Mitral Valve: There is Mild to moderate regurgitation. _ Tricuspid Valve: Moderate regurgitation post repair. _ Pulmonic Valve: The pulmonic valve appears unchanged from pre-bypass. _ Pericardium: The pericardium appears unchanged from pre-bypass. _ Comments: Following placement of left ventricular assist device, left ventricular function remains severely reduced. Right ventricular function is moderately reduced. There is moderate to severe tricuspid regurgitation. There is thickening of the tissue surrounding the circumflex artery visualized near the AV groove. The thickened tissue was seen collapsing into the left atrium when the left ventricle was underfilled, causing woresening LV filling and reduction in VAD flows. Prior to leaving OR, mitral valve appeared normal in structure. There was slight improvement in  MR. The inflow to the left ventricle remained patent, with laminar flow, despite some invagination of thickened tissue into the left atrium. PRE-OP FINDINGS  Left Ventricle: The left ventricle has severely reduced systolic function, with an ejection fraction of 20-30% 15%. The cavity size was severely dilated. There is no increase in left ventricular wall thickness. Left ventricular diffuse hypokinesis. Right Ventricle: The right ventricle has mildly reduced systolic function. The cavity was normal. There is no increase in right ventricular wall thickness. Left Atrium: Left atrial size was dilated. No left atrial/left atrial appendage thrombus was detected. The left atrial appendage is well visualized and there is no evidence of thrombus present. Right Atrium: Right atrial size was normal in size. Interatrial Septum: No atrial level shunt detected by color flow Doppler. Agitated saline contrast bubble study was negative, with no evidence of any interatrial shunt. There is no evidence of a patent foramen ovale. Pericardium: There is no evidence of pericardial effusion. There is no pleural effusion. Mitral Valve: The mitral valve is normal in structure. Mitral valve regurgitation is moderate by color flow Doppler. The MR jet is centrally-directed. Tricuspid Valve: The tricuspid valve was normal in structure. Tricuspid valve regurgitation is moderate by color flow Doppler. The jet is directed centrally. Aortic Valve: The aortic valve is normal in structure. Aortic valve regurgitation is mild by color flow Doppler. The jet is centrally-directed. There is no stenosis of the aortic valve, with a calculated valve area of 2.02 cm. Pulmonic Valve: The pulmonic valve was normal in structure. Pulmonic valve regurgitation is trivial by  color flow Doppler. Aorta: The aortic root, ascending aorta and aortic arch are normal in size and structure. Shunts: There is no evidence of an atrial septal defect. +--------------+--------++  LEFT VENTRICLE         +--------------+--------++ PLAX 2D                +--------------+--------++ LVOT diam:    2.30 cm  +--------------+--------++ LVOT Area:    4.15 cm +--------------+--------++                        +--------------+--------++ +------------------+-----------++ AORTIC VALVE                  +------------------+-----------++ AV Area (Vmax):   2.16 cm    +------------------+-----------++ AV Area (Vmean):  2.04 cm    +------------------+-----------++ AV Area (VTI):    2.02 cm    +------------------+-----------++ AV Vmax:          93.20 cm/s  +------------------+-----------++ AV Vmean:         62.200 cm/s +------------------+-----------++ AV VTI:           0.138 m     +------------------+-----------++ AV Peak Grad:     3.5 mmHg    +------------------+-----------++ AV Mean Grad:     2.0 mmHg    +------------------+-----------++ LVOT Vmax:        48.40 cm/s  +------------------+-----------++ LVOT Vmean:       30.600 cm/s +------------------+-----------++ LVOT VTI:         0.067 m     +------------------+-----------++ LVOT/AV VTI ratio:0.49        +------------------+-----------++  +------------+-------++ AORTA               +------------+-------++ Ao STJ diam:2.8 cm  +------------+-------++ Ao Asc diam:3.00 cm +------------+-------++ +---------------+-----------++ TRICUSPID VALVE            +---------------+-----------++ TR Peak grad:  36.5 mmHg   +---------------+-----------++ TR Vmax:       302.00 cm/s +---------------+-----------++  +--------------+-------+ SHUNTS                +--------------+-------+ Systemic VTI: 0.07 m  +--------------+-------+ Systemic Diam:2.30 cm +--------------+-------+  Anice Paganini Electronically signed by Anice Paganini Signature Date/Time: 08/29/2022/4:09:24 PM    Final    DG Chest Port 1 View  Result Date: 08/29/2022 CLINICAL DATA:  LVAD placement. EXAM: PORTABLE  CHEST 1 VIEW COMPARISON:  Chest radiograph dated 08/28/2022. FINDINGS: Endotracheal tube with tip approximately 5 cm above the carina. Right IJ Swan-Ganz with tip over the mediastinum likely in the proximal right main pulmonary artery. Interval removal of the right-sided PICC. There has been placement of a left ventricular assist device. Inferiorly approached drainage catheters noted. Left pectoral AICD device. Median sternotomy wires and stable cardiomegaly. Left mid lung field atelectasis. No pleural effusion or pneumothorax. IMPRESSION: 1. Support apparatus as above. 2. Left mid lung field atelectasis. Electronically Signed   By: Elgie Collard M.D.   On: 08/29/2022 16:08   EP STUDY  Result Date: 08/29/2022 See surgical note for result.  DG CHEST PORT 1 VIEW  Result Date: 08/28/2022 CLINICAL DATA:  Cardiogenic shock. EXAM: PORTABLE CHEST 1 VIEW COMPARISON:  Chest x-ray dated August 28, 2022 FINDINGS: Unchanged enlargement of the cardiac contours. Interval removal of intra-aortic balloon pump. Unchanged position of right IJ approach PA catheter and right arm PICC. Left chest wall pacer with unchanged lead positioning. Lungs are clear. No evidence of pleural effusion or pneumothorax. Old right-sided rib fractures. IMPRESSION:  1. Interval removal of intra-aortic balloon pump. 2. Stable support devices. 3. No new airspace opacity. Electronically Signed   By: Allegra Lai M.D.   On: 08/28/2022 13:26   DG Chest Port 1 View  Result Date: 08/28/2022 CLINICAL DATA:  Congestive heart failure. EXAM: PORTABLE CHEST 1 VIEW COMPARISON:  08/27/2022 FINDINGS: Stable cardiomegaly. Intra-aortic balloon pump tip is stable in the region of the proximal descending thoracic aorta. Again noted is a left chest ICD. Right jugular central line with pulmonary artery catheter. Pulmonary artery catheter is in the region of the distal right pulmonary artery. Right arm PICC line appears to terminate in the SVC. Negative for  pneumothorax. Slightly improved aeration at the right lung base may represent decreased atelectasis. No overt pulmonary edema. Old right rib fractures. IMPRESSION: 1. Stable lines and devices. 2. Slightly improved aeration at the right lung base. No overt pulmonary edema. 3. Stable cardiomegaly. Electronically Signed   By: Richarda Overlie M.D.   On: 08/28/2022 09:17   DG Chest Port 1 View  Result Date: 08/27/2022 CLINICAL DATA:  84132 CHF (congestive heart failure) (HCC) 97293 EXAM: PORTABLE CHEST - 1 VIEW COMPARISON:  08/25/2022 FINDINGS: Right IJ central venous catheter placement to the proximal right pulmonary artery. Stable right arm PICC line and left subclavian AICD. No pneumothorax. Lungs remain clear. Heart size upper limits normal. IABP tip at the level of the sixth rib in the proximal descending thoracic aorta. No effusion. Visualized bones unremarkable. IMPRESSION: Right IJ central venous catheter placement without pneumothorax. IABP tip proximal descending aorta Electronically Signed   By: Corlis Leak M.D.   On: 08/27/2022 09:44   CARDIAC CATHETERIZATION  Result Date: 08/25/2022 Findings: On milrinone 0.375 mcg/kg/min RA = 10 RV = 62/12 PA = 68/31 (43) PCW = 22 Fick cardiac output/index = 4.3/2.3 TD CO/CI= 3.6/1.9 PVR = 4.9 WU (Fick) 5.9 (TD) FA sat = 89% PA sat = 46%, 49% PAPI = 3.7 Assessment: 1. Persistent decompensated HF despite milrinone support Plan/Discussion: IABP placed. Move to ICU. Adjust inotropes as needed. VAD next week. Arvilla Meres, MD 12:59 PM  DG Chest Port 1 View  Result Date: 08/25/2022 CLINICAL DATA:  CHF.  Follow-up exam. EXAM: PORTABLE CHEST 1 VIEW COMPARISON:  08/10/2022. FINDINGS: Stable enlargement of the cardiac silhouette. Stable left anterior chest wall biventricular cardioverter-defibrillator. No mediastinal or hilar masses. Right internal jugular central venous line has been removed. Right PICC is new, tip projecting in the mid superior vena cava. Bilateral  interstitial thickening is stable from the prior exam. No lung consolidation. No pleural effusion or pneumothorax. Stable old right rib fractures. IMPRESSION: 1. Bilateral interstitial thickening similar to the prior exam, which suggests mild interstitial edema. No evidence of pneumonia. 2. Stable cardiomegaly. Electronically Signed   By: Amie Portland M.D.   On: 08/25/2022 07:50   ECHOCARDIOGRAM LIMITED  Result Date: 08/24/2022    ECHOCARDIOGRAM LIMITED REPORT   Patient Name:   Charles Holmes Date of Exam: 08/24/2022 Medical Rec #:  [id number]       Height:       67.0 in Accession #:    [id number]      Weight:       163.8 lb Date of Birth:  1965/02/21       BSA:          1.858 m Patient Age:    57 years        BP:           103/77  mmHg Patient Gender: M               HR:           99 bpm. Exam Location:  Inpatient Procedure: Limited Echo, Color Doppler and Cardiac Doppler Indications:    CHF  History:        Patient has prior history of Echocardiogram examinations, most                 recent 07/11/2022. CHF and Sarcoidosis, CAD, COPD and Stroke,                 Arrythmias:Atrial Fibrillation; Risk Factors:Hypertension and                 Dyslipidemia.  Sonographer:    Milbert Coulter Referring Phys: [id number] DANIEL R BENSIMHON IMPRESSIONS  1. 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 Left ventricular ejection fraction, by estimation, is 15%. The left ventricle has severely decreased function. The left ventricle demonstrates global hypokinesis. The left ventricular internal cavity size was severely dilated.  2. Right ventricular systolic function is mildly reduced. The right ventricular size is moderately enlarged. There is severely elevated pulmonary artery systolic pressure. The estimated right ventricular systolic pressure is 77.4 mmHg.  3. Left atrial size was mildly dilated.  4. A small pericardial effusion is present.  5. The mitral valve is grossly normal. Moderate mitral valve regurgitation.  6.  Tricuspid valve regurgitation is moderate.  7. The aortic valve was not well visualized. Aortic valve regurgitation is trivial.  8. The inferior vena cava is dilated in size with <50% respiratory variability, suggesting right atrial pressure of 15 mmHg. 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 Left ventricular ejection fraction, by estimation, is 15%. The left ventricle has severely decreased function. The left ventricle demonstrates global hypokinesis. The left ventricular internal cavity size was severely dilated. Right Ventricle: The right ventricular size is moderately enlarged. Right ventricular systolic function is mildly reduced. There is severely elevated pulmonary artery systolic pressure. The tricuspid regurgitant velocity is 3.95 m/s, and with an assumed right atrial pressure of 15 mmHg, the estimated right ventricular systolic pressure is 77.4 mmHg. Left Atrium: Left atrial size was mildly dilated. Right Atrium: Right atrial size was normal in size. Pericardium: A small pericardial effusion is present. Mitral Valve: The mitral valve is grossly normal. Moderate mitral valve regurgitation. Tricuspid Valve: The tricuspid valve is grossly normal. Tricuspid valve regurgitation is moderate. Aortic Valve: The aortic valve was not well visualized. Aortic valve regurgitation is trivial. Aortic valve mean gradient measures 3.0 mmHg. Aortic valve peak gradient measures 5.5 mmHg. Pulmonic Valve: The pulmonic valve was not assessed. Aorta: Aortic root could not be assessed. Venous: The inferior vena cava is dilated in size with less than 50% respiratory variability, suggesting right atrial pressure of 15 mmHg. Additional Comments: Spectral Doppler performed. Color Doppler performed.  LEFT VENTRICLE PLAX 2D LVIDd:         7.60 cm LVIDs:         7.40 cm LV PW:         1.20 cm LV IVS:        1.30 cm  LEFT ATRIUM           Index        RIGHT ATRIUM           Index LA diam:       4.20 cm 2.26 cm/m  RA Area:     17.60 cm LA Vol (A2C): 46.9 ml 25.25 ml/m  RA Volume:   47.60 ml  25.62 ml/m LA Vol (A4C): 53.6 ml 28.85 ml/m  AORTIC VALVE AV Vmax:           117.00 cm/s AV Vmean:          78.600 cm/s AV VTI:            0.159 m AV Peak Grad:      5.5 mmHg AV Mean Grad:      3.0 mmHg LVOT Vmax:         67.10 cm/s LVOT Vmean:        43.500 cm/s LVOT VTI:          0.085 m LVOT/AV VTI ratio: 0.53 MR Peak grad:    63.0 mmHg    TRICUSPID VALVE MR Mean grad:    44.0 mmHg    TR Peak grad:   62.4 mmHg MR Vmax:         397.00 cm/s  TR Vmax:        395.00 cm/s MR Vmean:        321.0 cm/s MR PISA:         2.26 cm     SHUNTS MR PISA Eff ROA: 22 mm       Systemic VTI: 0.08 m MR PISA Radius:  0.60 cm Weston Brass MD Electronically signed by Weston Brass MD Signature Date/Time: 08/24/2022/5:26:33 PM    Final     I have personally spent 95 minutes involved in face-to-face and non-face-to-face activities for this patient on the day of the visit. Professional time spent includes the following activities: Preparing to see the patient (review of tests), Obtaining and/or reviewing separately obtained history (admission/discharge record), Performing a medically appropriate examination and/or evaluation , Ordering medications/tests/procedures, referring and communicating with other health care professionals, Documenting clinical information in the EMR, Independently interpreting results (not separately reported), Communicating results to the patient/family/caregiver, Counseling and educating the patient/family/caregiver and Care coordination (not separately reported).  Electronically signed by:   Plan d/w requesting provider as well as ID pharm D  Note: This document was prepared using dragon voice recognition software and may include unintentional dictation errors.   Odette Fraction, MD Infectious Disease Physician [facility] Pager: [phone]

## 2022-09-23 NOTE — Progress Notes (Signed)
Hypoglycemic Event  CBG: 67  Treatment: D50 50 mL (25 gm)  Symptoms: None  Follow-up CBG: [id number] CBG Result:157   Possible Reasons for Event: Inadequate meal intake  Comments/MD notified:NA     Hayato Guaman D Margene Cherian

## 2022-09-23 NOTE — Progress Notes (Signed)
ANTICOAGULATION CONSULT NOTE  Pharmacy Consult for heparin >warfarin Indication:  LVAD  Allergies  Allergen Reactions   Amiodarone     Severe tremors   Percocet [Oxycodone-Acetaminophen] Itching    Patient Measurements: Height: 5\' 7"  (170.2 cm) Weight: 77.4 kg (170 lb 10.2 oz) IBW/kg (Calculated) : 66.1 Heparin Dosing Weight: 87kg  Vital Signs: Temp: 98.4 F (36.9 C) (09/07 1146) Temp Source: Oral (09/07 1146) BP: 89/77 (09/07 1146) Pulse Rate: 85 (09/07 1146)  Labs: Recent Labs    09/21/22 0446 09/22/22 0330 09/22/22 0919 09/23/22 0224  HGB 9.3*  --  9.2*  --   HCT 31.5*  --  30.8*  --   PLT 416*  --  400  --   LABPROT 25.7* 25.1*  --  21.8*  INR 2.3* 2.2*  --  1.9*  CREATININE 0.76 0.88  --  0.86    Estimated Creatinine Clearance: 86.5 mL/min (by C-G formula based on SCr of 0.86 mg/dL).   Medical History: Past Medical History:  Diagnosis Date   CAD (coronary artery disease)    CHF (congestive heart failure) (HCC)    GERD (gastroesophageal reflux disease)    Hyperlipidemia    Hypertension    Systolic heart failure (HCC) 2021   LVEF 18%, RVEF 38% on cardiac MRI 12/19/2019. possible cardiac sarcoidosis.   Wide-complex tachycardia 2021   wears LifeVest     Assessment: 57yoM on apixaban PTA for hx AF admitted for LVAD workup. Pt s/p HM3 implant on 8/13 c/b acute CVA postop. Head CT 8/17 stable, ok to start low dose heparin per neuro, warfarin started 8/19.  INR down to 1.9 today - warfarin was on hold since 9/2. Hgb stable at 9.3, plt 416 yesterday. LDH stable at 280. No s/sx of bleeding. Oral intake is still low, 2 ensures consumed yesterday.   Goal of Therapy:  INR 2-3 Monitor platelets by anticoagulation protocol: Yes   Plan:  Warfarin 2 mg tonight  Continue marinol for now  Daily INR, CBC  Thank you for allowing pharmacy to participate in this patient's care,  Wilmer Floor, PharmD PGY2 Cardiology Pharmacy Resident 09/23/2022 2:31 PM  Please  check AMION for all [facility] Pharmacy phone numbers After 10:00 PM, call Main Pharmacy [phone]

## 2022-09-23 NOTE — Progress Notes (Signed)
Daily Progress Note   Patient Name: Charles Holmes       Date: 09/23/2022 DOB: [birth year]  Age: 57 y.o. MRN#: [id number] Attending Physician: Dolores Patty, MD Primary Care Physician: [name], PA-C Admit Date: 08/10/2022  Reason for Consultation/Follow-up: Establishing goals of care  Subjective: Patient is laying in bed awake. He looks comfortable and reports he is "doing ok." No family at bedside.  Length of Stay: 44  Current Medications: Scheduled Meds:   sodium chloride   Intravenous Once   aspirin EC  81 mg Oral Daily   atorvastatin  80 mg Oral Daily   digoxin  0.125 mg Oral Daily   dorzolamide-timolol  1 drop Both Eyes BID   dronabinol  2.5 mg Oral BID AC   feeding supplement  237 mL Oral TID BM   Gerhardt's butt cream   Topical TID   insulin aspart  0-24 Units Subcutaneous TID WC & HS   latanoprost  1 drop Both Eyes QHS   melatonin  3 mg Oral QHS   mexiletine  250 mg Oral BID   midodrine  10 mg Oral Q8H   mirtazapine  7.5 mg Oral QHS   mometasone-formoterol  2 puff Inhalation BID   multivitamin with minerals  1 tablet Oral Daily   mouth rinse  15 mL Mouth Rinse 4 times per day   pantoprazole  40 mg Oral Daily   sertraline  50 mg Oral Daily   sildenafil  20 mg Oral TID   sodium chloride flush  3 mL Intravenous Q12H   umeclidinium bromide  1 puff Inhalation Daily   Warfarin - Pharmacist Dosing Inpatient   Does not apply q1600    Continuous Infusions:  sodium chloride Stopped (09/13/22 2035)   ampicillin (OMNIPEN) IV 2 g (09/23/22 1320)   cefTRIAXone (ROCEPHIN)  IV 2 g (09/23/22 1056)    PRN Meds: sodium chloride, acetaminophen, albuterol, dextrose, ondansetron (ZOFRAN) IV, mouth rinse, oxyCODONE, polyethylene glycol, sodium chloride, sodium chloride  flush, traMADol  Physical Exam Vitals reviewed.  Cardiovascular:     Rate and Rhythm: Normal rate.  Pulmonary:     Effort: Pulmonary effort is normal.  Skin:    General: Skin is warm and dry.     Comments: Face is flushed  Neurological:     Mental Status:  He is alert and oriented to person, place, and time.             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   Chronic obstructive pulmonary disease (HCC) 08/23/2022   History of CVA (cerebrovascular accident) 08/11/2022   Paroxysmal atrial fibrillation (HCC) 08/11/2022   Iron deficiency anemia 08/11/2022   CHF (congestive heart failure) (HCC) 08/10/2022   Stroke (cerebrum) (HCC) 07/11/2022   Status post surgery 07/10/2022   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   Coronary artery disease involving native coronary artery of native heart without angina pectoris 03/02/2020   Chronic systolic heart failure (HCC) 02/21/2020   Ventricular tachycardia (HCC) 02/19/2020   Ventricular tachycardia, sustained (HCC) 02/19/2020   AKI (acute kidney injury) (HCC) 12/19/2019   CAD (coronary artery disease) 12/19/2019   Hyperlipidemia with target LDL less than 70 12/19/2019   Wide-complex tachycardia 12/16/2019   Acute on chronic systolic CHF (congestive heart failure) (HCC) 12/16/2019   HTN (hypertension) 12/16/2019    Palliative Care Assessment & Plan   Patient Profile: 57 y.o. 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  Full assessment and workup; status post LVAD implant on 08-29-2022.  Postop day 1 code stroke called, CT significant for acute right MCA infarct, status  post thrombectomy.    Assessment: Patient seems less depressed than my last visit with him. He keeps eye contact through our entire conversation. He let me know that he has another hurdle to overcome with a new blood infection. We discussed the hurdles he has overcome so far during his hospitalization.  He states his appetite is not good and he has the same breakfast every morning: eggs, sausage, and grits. I encouraged him to ask nursing if he could have something different. He states that PT and OT are going okay and that he is doing the best he can with PT. I encouraged him to continue working with PT and OT. He is hopeful to get to CIR soon.  Since I saw him last his granddaughter was born. He smiled as he told me about her.  He then needed to get to the bedside commode so we agreed to talk more tomorrow.  Recommendations/Plan: Partial code status- no CPR due to LVAD Full scope Continued encouragement for participation with PT Continued PMT support   Code Status:    Code Status Orders  (From admission, onward)           Start     Ordered   08/29/22 1529  Limited resuscitation (code)  Continuous       Comments: Ventricular Assist Device in place.  Question Answer Comment  In the event of cardiac or respiratory ARREST: Initiate Code Blue, Call Rapid Response Yes   In the event of cardiac or respiratory ARREST: Perform CPR No   In the event of cardiac or respiratory ARREST: Perform Intubation/Mechanical Ventilation Yes   In the event of cardiac or respiratory ARREST: Use NIPPV/BiPAp only if indicated Yes   In the  event of cardiac or respiratory ARREST: Administer ACLS medications if indicated Yes   In the event of cardiac or respiratory ARREST: Perform Defibrillation or Cardioversion if indicated Yes      08/29/22 1528         Extensive chart review has been completed prior to meeting with patient including labs, vital signs, imaging, progress/consult notes, orders,  medications and available advance directive documents.    Time spent: 35 minutes  Thank you for allowing the Palliative Medicine Team to assist in the care of this patient.   Sherryll Burger, NP  Please contact Palliative Medicine Team phone at [phone] for questions and concerns.

## 2022-09-23 NOTE — Progress Notes (Signed)
Pharmacy Antibiotic Note  Charles Holmes is a 57 y.o. male admitted on 08/10/2022 now s/p LVAD placement 8/13 and recent leukocytosis and is afebrile. Pharmacy has been consulted for ceftriaxone dosing.  Patient has recent blood cultures with 1/2 bottles growing E. Faecalis and then other unidentified GPC.   Plan: Treat empirically with ampicillin + ceftriaxone. Monitor cultures and susceptibilities. Monitor WBC and fever curve.   Height: 5\' 7"  (170.2 cm) Weight: 77.4 kg (170 lb 10.2 oz) IBW/kg (Calculated) : 66.1  Temp (24hrs), Avg:99.5 F (37.5 C), Min:98.1 F (36.7 C), Max:101.4 F (38.6 C)  Recent Labs  Lab 09/18/22 0410 09/19/22 0341 09/20/22 0409 09/21/22 0446 09/22/22 0330 09/22/22 0919 09/23/22 0224  WBC 11.3* 11.6* 10.5 10.8*  --  13.1*  --   CREATININE 0.98 0.86 0.77 0.76 0.88  --  0.86    Estimated Creatinine Clearance: 86.5 mL/min (by C-G formula based on SCr of 0.86 mg/dL).    Allergies  Allergen Reactions   Amiodarone     Severe tremors   Percocet [Oxycodone-Acetaminophen] Itching    Antimicrobials this admission: Vanc 8/16>8/25 Cefepime 8/16>8/20 Merrem 8/20 > 8/25 Ampicillin 9/7 >> CTX 9/7 >>  Dose adjustments this admission:   Microbiology results: 8/16 Bcx > NGTD 8/16 sputum > NGTD 8/16 UCx > neg  9/6 BCx: GPC + Enterococcus faecalis  Thank you for allowing pharmacy to be a part of this patient's care.  Wilmer Floor, PharmD PGY2 Cardiology Pharmacy Resident 09/23/2022 11:11 AM

## 2022-09-23 NOTE — Progress Notes (Signed)
Mobility Specialist Progress Note    09/23/22 1449  Mobility  Activity Ambulated with assistance in hallway  Level of Assistance +2 (takes two people) (chair follow)  Assistive Device Other (Comment) (eva walker)  Distance Ambulated (ft) 130 ft (30+100)  Activity Response Tolerated well  Mobility Referral Yes  $Mobility charge 1 Mobility  Mobility Specialist Start Time (ACUTE ONLY) 1415  Mobility Specialist Stop Time (ACUTE ONLY) 1448  Mobility Specialist Time Calculation (min) (ACUTE ONLY) 33 min   Pre-Mobility: 88 HR, 96% SpO2  Pt received in chair and agreeable. Pt modA+2 to stand from chair. Pt required maxA to switch to battery power and organize lines with vest. Once up, pt w/ L later lean. Required verbal and tactile cues for focus as pt would drift when looking different directions. Took x1 seated rest break. C/o fatigue. Rolled back in chair and transferred to bed. Left with call bell in reach. RN aware.   [place] Mobility Specialist  Please Neurosurgeon or Rehab Office at [phone]

## 2022-09-24 DIAGNOSIS — I5023 Acute on chronic systolic (congestive) heart failure: Secondary | ICD-10-CM | POA: Diagnosis not present

## 2022-09-24 DIAGNOSIS — Z515 Encounter for palliative care: Secondary | ICD-10-CM | POA: Diagnosis not present

## 2022-09-24 LAB — BASIC METABOLIC PANEL
Anion gap: 11 (ref 5–15)
BUN: 16 mg/dL (ref 6–20)
CO2: 26 mmol/L (ref 22–32)
Calcium: 8.3 mg/dL — ABNORMAL LOW (ref 8.9–10.3)
Chloride: 100 mmol/L (ref 98–111)
Creatinine, Ser: 0.7 mg/dL (ref 0.61–1.24)
GFR, Estimated: >60 mL/min (ref >60)
Glucose, Bld: 74 mg/dL (ref 70–99)
Potassium: 4.4 mmol/L (ref 3.5–5.1)
Sodium: 137 mmol/L (ref 135–145)

## 2022-09-24 LAB — CBC
HCT: 28.6 % — ABNORMAL LOW (ref 39.0–52.0)
Hemoglobin: 8.7 g/dL — ABNORMAL LOW (ref 13.0–17.0)
MCH: 29.1 pg (ref 26.0–34.0)
MCHC: 30.4 g/dL (ref 30.0–36.0)
MCV: 95.7 fL (ref 80.0–100.0)
Platelets: 283 10*3/uL (ref 150–400)
RBC: 2.99 MIL/uL — ABNORMAL LOW (ref 4.22–5.81)
RDW: 18.1 % — ABNORMAL HIGH (ref 11.5–15.5)
WBC: 10.1 10*3/uL (ref 4.0–10.5)
nRBC: 0 % (ref 0.0–0.2)

## 2022-09-24 LAB — CULTURE, BLOOD (ROUTINE X 2)
Special Requests: ADEQUATE
Special Requests: ADEQUATE

## 2022-09-24 LAB — GLUCOSE, CAPILLARY
Glucose-Capillary: 79 mg/dL (ref 70–99)
Glucose-Capillary: 103 mg/dL — ABNORMAL HIGH (ref 70–99)
Glucose-Capillary: 86 mg/dL (ref 70–99)
Glucose-Capillary: 99 mg/dL (ref 70–99)

## 2022-09-24 LAB — PROTIME-INR
INR: 2.2 — ABNORMAL HIGH (ref 0.8–1.2)
Prothrombin Time: 24.4 s — ABNORMAL HIGH (ref 11.4–15.2)

## 2022-09-24 LAB — MAGNESIUM: Magnesium: 2.2 mg/dL (ref 1.7–2.4)

## 2022-09-24 LAB — LACTATE DEHYDROGENASE: LDH: 284 U/L — ABNORMAL HIGH (ref 98–192)

## 2022-09-24 MED ORDER — WARFARIN SODIUM 2 MG PO TABS
2.0000 mg | ORAL_TABLET | Freq: Once | ORAL | Status: AC
  Administered 2022-09-24: 2 mg via ORAL
  Filled 2022-09-24: qty 1

## 2022-09-24 MED ORDER — CHLORHEXIDINE GLUCONATE CLOTH 2 % EX PADS
6.0000 | MEDICATED_PAD | Freq: Every day | CUTANEOUS | Status: DC
  Administered 2022-09-25 – 2022-09-27 (×3): 6 via TOPICAL

## 2022-09-24 NOTE — Progress Notes (Signed)
ANTICOAGULATION CONSULT NOTE  Pharmacy Consult for heparin >warfarin Indication:  LVAD  Allergies  Allergen Reactions   Amiodarone     Severe tremors   Percocet [Oxycodone-Acetaminophen] Itching    Patient Measurements: Height: 5\' 7"  (170.2 cm) Weight: 79.8 kg (175 lb 14.8 oz) IBW/kg (Calculated) : 66.1 Heparin Dosing Weight: 87kg  Vital Signs: Temp: 98.2 F (36.8 C) (09/08 0726) Temp Source: Oral (09/08 0726) BP: 102/83 (09/08 0726) Pulse Rate: 77 (09/08 0726)  Labs: Recent Labs    09/22/22 0330 09/22/22 0919 09/23/22 0224 09/24/22 0538 09/24/22 0841  HGB  --  9.2*  --   --  8.7*  HCT  --  30.8*  --   --  28.6*  PLT  --  400  --   --  283  LABPROT 25.1*  --  21.8* 24.4*  --   INR 2.2*  --  1.9* 2.2*  --   CREATININE 0.88  --  0.86 0.70  --     Estimated Creatinine Clearance: 100.7 mL/min (by C-G formula based on SCr of 0.7 mg/dL).   Medical History: Past Medical History:  Diagnosis Date   CAD (coronary artery disease)    CHF (congestive heart failure) (HCC)    GERD (gastroesophageal reflux disease)    Hyperlipidemia    Hypertension    Systolic heart failure (HCC) 2021   LVEF 18%, RVEF 38% on cardiac MRI 12/19/2019. possible cardiac sarcoidosis.   Wide-complex tachycardia 2021   wears LifeVest     Assessment: 57yoM on apixaban PTA for hx AF admitted for LVAD workup. Pt s/p HM3 implant on 8/13 c/b acute CVA postop. Head CT 8/17 stable, ok to start low dose heparin per neuro, warfarin started 8/19.  INR up to 2.2 today - warfarin was on hold since 9/2. Hgb stable at 8.7, plt 283. LDH stable at 284. No s/sx of bleeding. Oral intake is still low, 2 >3 ensures consumed yesterday.   Goal of Therapy:  INR 2-3 Monitor platelets by anticoagulation protocol: Yes   Plan:  Warfarin 2 mg tonight  Continue marinol and remeron for now  Daily INR, CBC  Thank you for allowing pharmacy to participate in this patient's care,  Wilmer Floor, PharmD PGY2  Cardiology Pharmacy Resident 09/24/2022 9:35 AM  Please check AMION for all [facility] Pharmacy phone numbers After 10:00 PM, call Main Pharmacy [phone]

## 2022-09-24 NOTE — Progress Notes (Signed)
Patient ID: Charles Holmes, male   DOB: 27-Jul-1965, 57 y.o.   MRN: [id number]     Advanced Heart Failure Rounding Note  PCP-Cardiologist: Charles Herrlich, MD  [facility]: Dr. Gala Holmes   Subjective:    7/25: Admit with cardiogenic shock. Started milrinone and NE. 8/9 IABP placed 8/13 S/p HM III LVAD implant + clipping left atrial appendage d/t severe thickening and invagination of mitral valve annulus impeding flows. 8/14 Left-sided hemiplegia. CT head with acute R MCA infarct. IR for thrombectomy. CT head with small to moderate area of hemorrhagic conversion 8/17 Low dose heparin gtt restarted, CT head with decreased size of hemorrhagic stroke.  8/19: Limited echo: Mod RV dysfunction, better mitral inflow into the LV and VAD position satisfactory. 8/26: Echo showed moderate-severe RV dysfunction but RV not enlarged.  Interventricular septum does bow towards towards the right. Speed increased to 5400 rpm, flow rose to 4.9 L/min.  Interventricular septum remained stable bowing slightly towards the right.    8/27: RHC with RA mean 5, PA 30/7, mean PCWP 4, CI 3.49, PAPi 4.6 9/4: Ramp Echo>>Fixed speed 5400  9/6: fever. Bcx with enterococcus facelis 2/2  Remains on IV abx. Now AF. Denies CP or SOB. MAPs 70-90   LVAD Interrogation HM III: Speed: 5400 Flow: 4.7  PI: 2.5 Power: 3.9. No PI events. VAD interrogated personally. Parameters stable.  Objective:   Weight Range: 79.8 kg Body mass index is 27.55 kg/m.   Vital Signs:   Temp:  [98.2 F (36.8 C)-98.4 F (36.9 C)] 98.2 F (36.8 C) (09/08 0726) Pulse Rate:  [74-187] 77 (09/08 0726) Resp:  [16-22] 17 (09/08 0726) BP: (89-154)/(58-92) 102/83 (09/08 0726) SpO2:  [84 %-93 %] 93 % (09/08 0801) Weight:  [79.8 kg] 79.8 kg (09/08 0337) Last BM Date : 09/24/22  Weight change: Filed Weights   09/22/22 0424 09/23/22 0500 09/24/22 0337  Weight: 78.2 kg 77.4 kg 79.8 kg   Intake/Output:   Intake/Output Summary (Last 24 hours) at 09/24/2022  1019 Last data filed at 09/24/2022 0618 Gross per 24 hour  Intake 820 ml  Output 750 ml  Net 70 ml      Physical Exam   General:  NAD.  HEENT: normal  Neck: supple. JVP not elevated.  Carotids 2+ bilat; no bruits. No lymphadenopathy or thryomegaly appreciated. Cor: LVAD hum.  Lungs: Clear. Abdomen:  soft, nontender, non-distended. No hepatosplenomegaly. No bruits or masses. Good bowel sounds. Driveline site clean. Anchor in place.  Extremities: no cyanosis, clubbing, rash. Warm no edema  Neuro: alert & oriented x 3. Weak on L    Telemetry    SR 70-80s Personally reviewed  Labs    CBC Recent Labs    09/22/22 0919 09/24/22 0841  WBC 13.1* 10.1  HGB 9.2* 8.7*  HCT 30.8* 28.6*  MCV 94.2 95.7  PLT 400 283    Basic Metabolic Panel Recent Labs    14/78/29 0224 09/24/22 0538  NA 135 137  K 3.9 4.4  CL 98 100  CO2 27 26  GLUCOSE 82 74  BUN 20 16  CREATININE 0.86 0.70  CALCIUM 8.7* 8.3*  MG 1.9 2.2    BNP: BNP (last 3 results) Recent Labs    09/04/22 2144 09/11/22 2252 09/18/22 2246  BNP 1,050.6* 933.0* 809.0*     Imaging    No results found.   Medications:     Scheduled Medications:  sodium chloride   Intravenous Once   aspirin EC  81 mg Oral Daily  atorvastatin  80 mg Oral Daily   digoxin  0.125 mg Oral Daily   dorzolamide-timolol  1 drop Both Eyes BID   dronabinol  2.5 mg Oral BID AC   feeding supplement  237 mL Oral TID BM   Gerhardt's butt cream   Topical TID   insulin aspart  0-24 Units Subcutaneous TID WC & HS   latanoprost  1 drop Both Eyes QHS   melatonin  3 mg Oral QHS   mexiletine  250 mg Oral BID   midodrine  10 mg Oral Q8H   mirtazapine  7.5 mg Oral QHS   mometasone-formoterol  2 puff Inhalation BID   multivitamin with minerals  1 tablet Oral Daily   mouth rinse  15 mL Mouth Rinse 4 times per day   pantoprazole  40 mg Oral Daily   sertraline  50 mg Oral Daily   sildenafil  20 mg Oral TID   sodium chloride flush  3 mL  Intravenous Q12H   umeclidinium bromide  1 puff Inhalation Daily   warfarin  2 mg Oral ONCE-1600   Warfarin - Pharmacist Dosing Inpatient   Does not apply q1600    Infusions:  sodium chloride Stopped (09/13/22 2035)   ampicillin (OMNIPEN) IV 2 g (09/24/22 0420)   cefTRIAXone (ROCEPHIN)  IV 2 g (09/23/22 2223)    PRN Medications: sodium chloride, acetaminophen, albuterol, dextrose, ondansetron (ZOFRAN) IV, mouth rinse, oxyCODONE, polyethylene glycol, sodium chloride, sodium chloride flush, traMADol   Patient Profile  [name] is a 57 y.o. 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 Admitted with cardiogenic shock, stabilized and underwent HM3 LVAD. Post implant course c/b acute CVA.   Assessment/Plan  1.  Acute on chronic Systolic HF-->Cardiogenic Shock  - Diagnosed 11/2019. Presented with VT. LHC 70% LAD  - cMRI 12/21 concerning for sarcoid and EF 18%.  - PET 2/22 at [facility] EF 25% + active sarcoid - Echo 08/26/20 EF < 20% severely dilated LV RV mildly decreased.  - Medtronic CRT-D upgrade in 06/08/21 - Echo 07/10/22: EF <20%, RV okay, mod pericardial effusion, mod MR/TR - Admitted 07/25 with cardiogenic shock. - RHC: Nonobstructive CAD, severely elevated filling pressures and low Fick CO/CI (2.7/1.4) - 08/13 HM III LVAD implant + clipping LAA d/t severe thickening and invagination of mitral valve annulus impeding flows.  - Apical core sent - no mention of sarcoid - Speed increased to 5300 on 08/14.  - Echo 8/26 mod-sev RV dysfunction. Speed increased to 5400 8/26 - RHC 8/27: low filing pressures with excellent cardiac output on EPI, PAPi 4.6 - Ramp Echo 9/4, speed increased to 5400  - MAPs 70-80s. Continue midodrine for now. Can wean as tolerated - Volume status ok. Can dose diuretics as needed - Continue digoxin 0.125 mg daily - Eventual CIR. Hopefully this week   2. HM-3 LVAD - VAD interrogated personally.  Parameters stable. - LDH ok at 284 - INR 2.2. ASA 81 mg daily. Discussed dosing with PharmD personally.  3.  Acute stroke - Hx CVA 06/24 -Admitted 06/24 w/ R MCA stroke. S/p TPA and mechanical clot extraction. No residual deficits. Likely cardioembolic in setting of severe LV dysfunction. - Developed left sided weakness 08/14. CTA with R MCA infarct. Taken to IR for thrombectomy - Repeat CT head with small to moderate size hemorrhagic conversion.  - repeat head CT on 8/17 w/ improved hemorrhagic CVA - Back on warfarin. INR 2.2 -  neurology signed off 09/03/22.  - Continue PT/OT - Continue ASA 81 mg daily - Plan CIR   4. Enterococcus faecalis bacteremia - Bcx 2/2 on 9/6 - ID consult 9/6 -> ampicillin and ceftriaxone - TEE recommended but doubt it will change management as VAD cannot be removed and will require IV abx followed by long-term suppression - will get TTE to look for obvious vegetations - PICC out  5. Hx VT - ln setting of potential sarcoid heart disease  - Off amio due to tremor. Continue mexiletine  - now s/p ICD.   6. CAD - LHC 12/07/19 70-% LAD, no intervention - LHC 8/24 non obstructive CAD.  - Continue statin. On aspirin for VAD. - No s/s angina  7. Cardiac sarcoid - PET 2/22 at [facility] EF 25% + active sarcoid - Has completed prednisone.  - holding methotrexate w/ recent surgery, timing to restart to be discussed with MD - apical core pathology not diagnostic of cardiac sarcoidosis.   8. 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 AKI - suspect cardiorenal, improved w/ inotropic support - Creatinine stable now   10. Iron deficiency anemia/ Post-op anemia - recent T sat 15%, scheduled for OP feraheme. Will complete inpatient  - Transfused 1 u RBCs 8/15 - Hgb at 8.7   11. Pulmonary  - PFTs with severe obstructive defect, response to bronchodilator. FEV1 1.04L, FEV1/FVC 48% - extubated 8/15  - Cultures 08/28 - NG - Stable  12. Depression -  Improved   Arvilla Meres, MD  10:19 AM

## 2022-09-24 NOTE — Progress Notes (Signed)
Daily Progress Note   Patient Name: Charles Holmes       Date: 09/24/2022 DOB: 16-Dec-1965  Age: 57 y.o. MRN#: [id number] Attending Physician: Dolores Patty, MD Primary Care Physician: [name], PA-C Admit Date: 08/10/2022  Reason for Consultation/Follow-up: Establishing goals of care  Subjective: Patient is laying in bed awake watching football.  No family at bedside.  Length of Stay: 45  Current Medications: Scheduled Meds:   sodium chloride   Intravenous Once   aspirin EC  81 mg Oral Daily   atorvastatin  80 mg Oral Daily   digoxin  0.125 mg Oral Daily   dorzolamide-timolol  1 drop Both Eyes BID   dronabinol  2.5 mg Oral BID AC   feeding supplement  237 mL Oral TID BM   Gerhardt's butt cream   Topical TID   insulin aspart  0-24 Units Subcutaneous TID WC & HS   latanoprost  1 drop Both Eyes QHS   melatonin  3 mg Oral QHS   mexiletine  250 mg Oral BID   midodrine  10 mg Oral Q8H   mirtazapine  7.5 mg Oral QHS   mometasone-formoterol  2 puff Inhalation BID   multivitamin with minerals  1 tablet Oral Daily   mouth rinse  15 mL Mouth Rinse 4 times per day   pantoprazole  40 mg Oral Daily   sertraline  50 mg Oral Daily   sildenafil  20 mg Oral TID   sodium chloride flush  3 mL Intravenous Q12H   umeclidinium bromide  1 puff Inhalation Daily   warfarin  2 mg Oral ONCE-1600   Warfarin - Pharmacist Dosing Inpatient   Does not apply q1600    Continuous Infusions:  sodium chloride Stopped (09/13/22 2035)   ampicillin (OMNIPEN) IV 2 g (09/24/22 1451)   cefTRIAXone (ROCEPHIN)  IV 2 g (09/24/22 1259)    PRN Meds: sodium chloride, acetaminophen, albuterol, dextrose, ondansetron (ZOFRAN) IV, mouth rinse, oxyCODONE, polyethylene glycol, sodium chloride, sodium chloride  flush, traMADol  Physical Exam Vitals reviewed.  Cardiovascular:     Rate and Rhythm: Normal rate.  Pulmonary:     Effort: Pulmonary effort is normal.  Skin:    General: Skin is warm and dry.     Comments: Face is flushed  Neurological:  Mental Status: He is alert and oriented to person, place, and time.             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   Chronic obstructive pulmonary disease (HCC) 08/23/2022   History of CVA (cerebrovascular accident) 08/11/2022   Paroxysmal atrial fibrillation (HCC) 08/11/2022   Iron deficiency anemia 08/11/2022   CHF (congestive heart failure) (HCC) 08/10/2022   Stroke (cerebrum) (HCC) 07/11/2022   Status post surgery 07/10/2022   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   Coronary artery disease involving native coronary artery of native heart without angina pectoris 03/02/2020   Chronic systolic heart failure (HCC) 02/21/2020   Ventricular tachycardia (HCC) 02/19/2020   Ventricular tachycardia, sustained (HCC) 02/19/2020   AKI (acute kidney injury) (HCC) 12/19/2019   CAD (coronary artery disease) 12/19/2019   Hyperlipidemia with target LDL less than 70 12/19/2019   Wide-complex tachycardia 12/16/2019   Acute on chronic systolic CHF (congestive heart failure) (HCC) 12/16/2019   HTN (hypertension) 12/16/2019    Palliative Care Assessment & Plan   Patient Profile: 57 y.o. 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  Full assessment and workup; status post LVAD implant on 08-29-2022.  Postop day 1 code stroke called, CT significant for acute right MCA infarct, status post  thrombectomy.    Assessment: Patient was less interactive than yesterday. He answered my questions but keeps eye contact about half the time. He states he had a good day overall yesterday and today he was able to eat more of his breakfast. He has been having more bowel movements over the last day which we discussed may be due to his antibiotics.  For a moment he thought his dad was in the room with [place], but it was his reflection in the mirror. His dad is alive and lives near him at home. We discussed all the family and support he has and keeping his "eye on the prize"--getting to CIR and eventually home. He says he did get up and move yesterday with the mobility specialist.   Recommendations/Plan: Partial code status- no CPR due to LVAD Full scope Continued encouragement for participation with PT Continued PMT support   Code Status:    Code Status Orders  (From admission, onward)           Start     Ordered   08/29/22 1529  Limited resuscitation (code)  Continuous       Comments: Ventricular Assist Device in place.  Question Answer Comment  In the event of cardiac or respiratory ARREST: Initiate Code Blue, Call Rapid Response Yes   In the event of cardiac or respiratory ARREST: Perform CPR No   In the event of cardiac or respiratory ARREST: Perform Intubation/Mechanical Ventilation Yes   In the event of cardiac or respiratory ARREST: Use NIPPV/BiPAp only if indicated Yes   In the event of cardiac or respiratory ARREST: Administer ACLS medications if indicated Yes   In the event of cardiac or respiratory ARREST: Perform Defibrillation or  Cardioversion if indicated Yes      08/29/22 1528         Extensive chart review has been completed prior to meeting with patient including labs, vital signs, imaging, progress/consult notes, orders, medications and available advance directive documents.    Time spent: 35 minutes  Thank you for allowing the Palliative Medicine Team to  assist in the care of this patient.   Sherryll Burger, NP  Please contact Palliative Medicine Team phone at [phone] for questions and concerns.

## 2022-09-25 ENCOUNTER — Inpatient Hospital Stay (HOSPITAL_COMMUNITY): Payer: Medicare Other

## 2022-09-25 DIAGNOSIS — I5023 Acute on chronic systolic (congestive) heart failure: Secondary | ICD-10-CM | POA: Diagnosis not present

## 2022-09-25 DIAGNOSIS — R7881 Bacteremia: Secondary | ICD-10-CM

## 2022-09-25 DIAGNOSIS — I428 Other cardiomyopathies: Secondary | ICD-10-CM | POA: Diagnosis not present

## 2022-09-25 DIAGNOSIS — B952 Enterococcus as the cause of diseases classified elsewhere: Secondary | ICD-10-CM | POA: Insufficient documentation

## 2022-09-25 LAB — ECHOCARDIOGRAM COMPLETE
Area-P 1/2: 5.5 cm2
Est EF: <20
Height: 67 in
S' Lateral: 5.8 cm
Weight: 2825.42 [oz_av]

## 2022-09-25 LAB — CBC
HCT: 32 % — ABNORMAL LOW (ref 39.0–52.0)
Hemoglobin: 9.5 g/dL — ABNORMAL LOW (ref 13.0–17.0)
MCH: 27.9 pg (ref 26.0–34.0)
MCHC: 29.7 g/dL — ABNORMAL LOW (ref 30.0–36.0)
MCV: 93.8 fL (ref 80.0–100.0)
Platelets: 286 K/uL (ref 150–400)
RBC: 3.41 MIL/uL — ABNORMAL LOW (ref 4.22–5.81)
RDW: 18.2 % — ABNORMAL HIGH (ref 11.5–15.5)
WBC: 9.5 K/uL (ref 4.0–10.5)
nRBC: 0 % (ref 0.0–0.2)

## 2022-09-25 LAB — BASIC METABOLIC PANEL
Anion gap: 9 (ref 5–15)
BUN: 12 mg/dL (ref 6–20)
CO2: 29 mmol/L (ref 22–32)
Calcium: 8.7 mg/dL — ABNORMAL LOW (ref 8.9–10.3)
Chloride: 101 mmol/L (ref 98–111)
Creatinine, Ser: 0.65 mg/dL (ref 0.61–1.24)
GFR, Estimated: >60 mL/min (ref >60)
Glucose, Bld: 77 mg/dL (ref 70–99)
Potassium: 4.3 mmol/L (ref 3.5–5.1)
Sodium: 139 mmol/L (ref 135–145)

## 2022-09-25 LAB — GLUCOSE, CAPILLARY
Glucose-Capillary: 77 mg/dL (ref 70–99)
Glucose-Capillary: 79 mg/dL (ref 70–99)
Glucose-Capillary: 106 mg/dL — ABNORMAL HIGH (ref 70–99)
Glucose-Capillary: 72 mg/dL (ref 70–99)

## 2022-09-25 LAB — MAGNESIUM: Magnesium: 2 mg/dL (ref 1.7–2.4)

## 2022-09-25 LAB — PROTIME-INR
INR: 2.3 — ABNORMAL HIGH (ref 0.8–1.2)
Prothrombin Time: 25.9 s — ABNORMAL HIGH (ref 11.4–15.2)

## 2022-09-25 LAB — LACTATE DEHYDROGENASE: LDH: 285 U/L — ABNORMAL HIGH (ref 98–192)

## 2022-09-25 MED ORDER — WARFARIN SODIUM 2 MG PO TABS
2.0000 mg | ORAL_TABLET | Freq: Once | ORAL | Status: AC
  Administered 2022-09-25: 2 mg via ORAL
  Filled 2022-09-25: qty 1

## 2022-09-25 MED ORDER — FUROSEMIDE 40 MG PO TABS
40.0000 mg | ORAL_TABLET | Freq: Once | ORAL | Status: AC
  Administered 2022-09-25: 40 mg via ORAL
  Filled 2022-09-25: qty 1

## 2022-09-25 NOTE — Progress Notes (Signed)
This chaplain is present for F/U spiritual care. The Pt. is awake after a procedure and willing to briefly engage with the chaplain.   The chaplain listened reflectively as the Pt. shared the morning has been busy and he is hoping to take a nap. The Pt. agrees to a visit at another time to update the chaplain on his weekend and progress.  Chaplain Stephanie Acre [phone]

## 2022-09-25 NOTE — Progress Notes (Signed)
Patient ID: Charles Holmes, male   DOB: 04-22-1965, 57 y.o.   MRN: [id number]     Advanced Heart Failure Rounding Note  PCP-Cardiologist: Norman Herrlich, MD  [facility]: Dr. Gala Romney   Subjective:    7/25: Admit with cardiogenic shock. Started milrinone and NE. 8/9 IABP placed 8/13 S/p HM III LVAD implant + clipping left atrial appendage d/t severe thickening and invagination of mitral valve annulus impeding flows. 8/14 Left-sided hemiplegia. CT head with acute R MCA infarct. IR for thrombectomy. CT head with small to moderate area of hemorrhagic conversion 8/17 Low dose heparin gtt restarted, CT head with decreased size of hemorrhagic stroke.  8/19: Limited echo: Mod RV dysfunction, better mitral inflow into the LV and VAD position satisfactory. 8/26: Echo showed moderate-severe RV dysfunction but RV not enlarged.  Interventricular septum does bow towards towards the right. Speed increased to 5400 rpm, flow rose to 4.9 L/min.  Interventricular septum remained stable bowing slightly towards the right.    8/27: RHC with RA mean 5, PA 30/7, mean PCWP 4, CI 3.49, PAPi 4.6 9/4: Ramp Echo>>Fixed speed 5400  9/6: fever. Bcx with enterococcus facelis 2/2  Remains on ampicillin + ceftriaxone. Afebrile. WBC nl. Repeat BCx 9/8 NGTD.   MAPs 80s.   Feels a bit SOB. Wt gradually trending up. No other complaints.    LVAD Interrogation HM III: Speed: 5400 Flow: 4.3  PI: 4.4 Power: 4.0. 2 PI events. VAD interrogated personally. Parameters stable.  Objective:   Weight Range: 80.1 kg Body mass index is 27.66 kg/m.   Vital Signs:   Temp:  [97.4 F (36.3 C)-98.2 F (36.8 C)] 98 F (36.7 C) (09/09 0753) Pulse Rate:  [66-149] 69 (09/09 0753) Resp:  [15-21] 16 (09/09 0500) BP: (94-113)/(67-89) 101/72 (09/09 0753) SpO2:  [92 %-96 %] 92 % (09/09 0753) Weight:  [80.1 kg] 80.1 kg (09/09 0500) Last BM Date : 09/24/22  Weight change: Filed Weights   09/23/22 0500 09/24/22 0337 09/25/22 0500  Weight:  77.4 kg 79.8 kg 80.1 kg   Intake/Output:   Intake/Output Summary (Last 24 hours) at 09/25/2022 0825 Last data filed at 09/25/2022 0755 Gross per 24 hour  Intake 240 ml  Output 400 ml  Net -160 ml      Physical Exam   General:  fatigued/depressed appearing.  HEENT: normal  Neck: supple. JVP 9-10 cm Carotids 2+ bilat; no bruits. No lymphadenopathy or thryomegaly appreciated. Cor: LVAD hum.  Lungs: decreased BS at the bases  Abdomen:  soft, nontender, non-distended. No hepatosplenomegaly. No bruits or masses. Good bowel sounds. Driveline site clean. Anchor in place.  Extremities: no cyanosis, clubbing, rash. Warm no edema  Neuro: alert & oriented x 3. Able to move all 4 ext but c/w left sided weakness.    Telemetry    SR 70-80s Personally reviewed  Labs    CBC Recent Labs    09/24/22 0841 09/25/22 0220  WBC 10.1 9.5  HGB 8.7* 9.5*  HCT 28.6* 32.0*  MCV 95.7 93.8  PLT 283 286    Basic Metabolic Panel Recent Labs    40/98/11 0538 09/25/22 0220  NA 137 139  K 4.4 4.3  CL 100 101  CO2 26 29  GLUCOSE 74 77  BUN 16 12  CREATININE 0.70 0.65  CALCIUM 8.3* 8.7*  MG 2.2 2.0    BNP: BNP (last 3 results) Recent Labs    09/04/22 2144 09/11/22 2252 09/18/22 2246  BNP 1,050.6* 933.0* 809.0*     Imaging  No results found.   Medications:     Scheduled Medications:  sodium chloride   Intravenous Once   aspirin EC  81 mg Oral Daily   atorvastatin  80 mg Oral Daily   Chlorhexidine Gluconate Cloth  6 each Topical Q0600   digoxin  0.125 mg Oral Daily   dorzolamide-timolol  1 drop Both Eyes BID   dronabinol  2.5 mg Oral BID AC   feeding supplement  237 mL Oral TID BM   Gerhardt's butt cream   Topical TID   insulin aspart  0-24 Units Subcutaneous TID WC & HS   latanoprost  1 drop Both Eyes QHS   melatonin  3 mg Oral QHS   mexiletine  250 mg Oral BID   midodrine  10 mg Oral Q8H   mirtazapine  7.5 mg Oral QHS   mometasone-formoterol  2 puff Inhalation  BID   multivitamin with minerals  1 tablet Oral Daily   mouth rinse  15 mL Mouth Rinse 4 times per day   pantoprazole  40 mg Oral Daily   sertraline  50 mg Oral Daily   sildenafil  20 mg Oral TID   sodium chloride flush  3 mL Intravenous Q12H   umeclidinium bromide  1 puff Inhalation Daily   Warfarin - Pharmacist Dosing Inpatient   Does not apply q1600    Infusions:  sodium chloride Stopped (09/13/22 2035)   ampicillin (OMNIPEN) IV 2 g (09/25/22 0534)   cefTRIAXone (ROCEPHIN)  IV 2 g (09/24/22 2233)    PRN Medications: sodium chloride, acetaminophen, albuterol, dextrose, ondansetron (ZOFRAN) IV, mouth rinse, oxyCODONE, polyethylene glycol, sodium chloride, sodium chloride flush, traMADol   Patient Profile  [name] is a 57 y.o. 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 Admitted with cardiogenic shock, stabilized and underwent HM3 LVAD. Post implant course c/b acute CVA and enterococcus faecalis bacteremia.   Assessment/Plan  1.  Acute on chronic Systolic HF-->Cardiogenic Shock  - Diagnosed 11/2019. Presented with VT. LHC 70% LAD  - cMRI 12/21 concerning for sarcoid and EF 18%.  - PET 2/22 at [facility] EF 25% + active sarcoid - Echo 08/26/20 EF < 20% severely dilated LV RV mildly decreased.  - Medtronic CRT-D upgrade in 06/08/21 - Echo 07/10/22: EF <20%, RV okay, mod pericardial effusion, mod MR/TR - Admitted 07/25 with cardiogenic shock. - RHC: Nonobstructive CAD, severely elevated filling pressures and low Fick CO/CI (2.7/1.4) - 08/13 HM III LVAD implant + clipping LAA d/t severe thickening and invagination of mitral valve annulus impeding flows.  - Apical core sent - no mention of sarcoid - Speed increased to 5300 on 08/14.  - Echo 8/26 mod-sev RV dysfunction. Speed increased to 5400 8/26 - RHC 8/27: low filing pressures with excellent cardiac output on EPI, PAPi 4.6 - Ramp Echo 9/4, speed increased to 5400  -  MAPs 80s. Continue midodrine for now. Can wean as tolerated - Mild volume overload. PO Lasix 40 mg x 1  - Continue digoxin 0.125 mg daily - Eventual CIR. Hopefully this week   2. HM-3 LVAD - VAD interrogated personally. Parameters stable. - LDH ok at 285 - INR 2.3. ASA 81 mg daily. Discussed dosing with PharmD personally.  3.  Acute stroke - Hx CVA 06/24 -Admitted 06/24 w/ R MCA stroke. S/p TPA and mechanical clot extraction. No residual deficits. Likely cardioembolic in setting of severe LV dysfunction. - Developed left sided weakness 08/14.  CTA with R MCA infarct. Taken to IR for thrombectomy - Repeat CT head with small to moderate size hemorrhagic conversion.  - repeat head CT on 8/17 w/ improved hemorrhagic CVA - Back on warfarin. INR 2.3 - neurology signed off 09/03/22.  - Continue PT/OT - Continue ASA 81 mg daily - Plan CIR   4. Enterococcus faecalis bacteremia - Bcx 2/2 on 9/6 - ID consult 9/6 -> ampicillin and ceftriaxone - TEE recommended but doubt it will change management as VAD cannot be removed and will require IV abx followed by long-term suppression - will get TTE to look for obvious vegetations. Plan today  - PICC out - Repeat BCx 9/8 NGTD.  5. Hx VT - ln setting of potential sarcoid heart disease  - Off amio due to tremor. Continue mexiletine  - now s/p ICD.   6. CAD - LHC 12/07/19 70-% LAD, no intervention - LHC 8/24 non obstructive CAD.  - Continue statin. On aspirin for VAD. - No s/s angina  7. Cardiac sarcoid - PET 2/22 at [facility] EF 25% + active sarcoid - Has completed prednisone.  - holding methotrexate w/ recent surgery, timing to restart to be discussed with MD - apical core pathology not diagnostic of cardiac sarcoidosis.   8. 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 AKI - suspect cardiorenal, improved w/ inotropic support - Creatinine stable now   10. Iron deficiency anemia/ Post-op anemia - recent T sat 15%, scheduled for OP feraheme.  Will complete inpatient  - Transfused 1 u RBCs 8/15 - Hgb at 9.5   11. Pulmonary  - PFTs with severe obstructive defect, response to bronchodilator. FEV1 1.04L, FEV1/FVC 48% - extubated 8/15  - Cultures 08/28 - NG - Stable  12. Depression - Improved  Discussed need to be more aggressive w/ PT. Get OOB today.    Kendallyn Lippold, PA-C  8:25 AM

## 2022-09-25 NOTE — Progress Notes (Signed)
Occupational Therapy Treatment Patient Details Name: Charles Holmes MRN: [id number] DOB: 08/07/1965 Today's Date: 09/25/2022   History of present illness Pt is 57 year old presented to [facility] on  08/10/22 for acute on chronic systolic heart failure. Pt had IABP placed on 08/25/22 for ongoing cardiogenic shock. Underwent placement of LVAD on 8/13. Developed lt sided weakness on 8/14. Rt MCA CVA. Underwent mechanical thrombectomy by IR. Extubated 8/15.  PMH - CAD, CHF, HLD, HTN, systolic HF, ICD implant, rt CVA.  LVAD placement and new stroke R MCA M1.   OT comments  Pt progressing towards goals this session, seen after PT session. Pt needing CGA-minA for standing/seated grooming tasks, pt able to stand for oral care, however needing to sit/rest to brush his hair and assist with washing his hair while up in chair. Pt min A for step pivot transfer from chair  > bed, as ECHO coming to see pt. Practiced connecting/disconnecting VAD lines using practice cords, pt needing hand over hand assist to use LUE to stabilize while R hand twisted to disconnect/connect cords, pt assisted onto bedpan per request, encouraged continued LUE exercises and practice with practice cords. Patient will benefit from intensive inpatient follow up therapy, >3 hours/day to maximize safety/ind with ADLs/functional mobility.  PF 4.7 PS 5400 PI 2.8 PP 4.0 No alarming of VAD system noted throughout session.       If plan is discharge home, recommend the following:  Two people to help with walking and/or transfers;Help with stairs or ramp for entrance;Assistance with feeding;Assist for transportation;Assistance with cooking/housework;A lot of help with bathing/dressing/bathroom   Equipment Recommendations  Other (comment) (defer)    Recommendations for Other Services PT consult    Precautions / Restrictions Precautions Precautions: Sternal;Fall Precaution Comments: LVAD Restrictions Weight Bearing Restrictions: No RUE Weight  Bearing: Non weight bearing RLE Weight Bearing: Non weight bearing Other Position/Activity Restrictions: Sternal Precautions       Mobility Bed Mobility Overal bed mobility: Needs Assistance         Sit to supine: Min assist   General bed mobility comments: min A to elevate feet    Transfers Overall transfer level: Needs assistance Equipment used: 1 person hand held assist (eva walker) Transfers: Sit to/from Stand, Bed to chair/wheelchair/BSC Sit to Stand: Min assist     Step pivot transfers: Min assist           Balance Overall balance assessment: Needs assistance Sitting-balance support: Feet supported, No upper extremity supported Sitting balance-Leahy Scale: Fair     Standing balance support: Bilateral upper extremity supported Standing balance-Leahy Scale: Poor                             ADL either performed or assessed with clinical judgement   ADL Overall ADL's : Needs assistance/impaired     Grooming: Oral care;Brushing hair;Wash/dry face;Standing;Sitting;Contact guard assist;Minimal assistance Grooming Details (indicate cue type and reason): minA when standing for steadying assist                 Toilet Transfer: Minimal assistance (eva walker)           Functional mobility during ADLs: Minimal assistance      Extremity/Trunk Assessment Upper Extremity Assessment Upper Extremity Assessment: Right hand dominant RUE Deficits / Details: Globally LUE Deficits / Details: Improved A/AAROM to LUE.  Able to achieve full grasp, and 3/4 finger extension.  AROM noted to wrist, elbow and shoulder.  MMT grossly 2+/5 LUE Sensation: WNL LUE Coordination: decreased fine motor;decreased gross motor   Lower Extremity Assessment Lower Extremity Assessment: Defer to PT evaluation        Vision   Vision Assessment?: No apparent visual deficits   Perception Perception Perception: Not tested   Praxis Praxis Praxis: Not tested     Cognition Arousal: Alert Behavior During Therapy: WFL for tasks assessed/performed Overall Cognitive Status: Impaired/Different from baseline Area of Impairment: Safety/judgement, Problem solving, Memory                     Memory: Decreased recall of precautions Following Commands: Follows one step commands consistently Safety/Judgement: Decreased awareness of deficits Awareness: Emergent Problem Solving: Slow processing General Comments: min cues for sternal prec this session,        Exercises Exercises: Other exercises Other Exercises Other Exercises: digit composite flex/ext x5 Other Exercises: digit opposition x5 Other Exercises: holding VAD practice cord with LUE, twisting with RUE    Shoulder Instructions       General Comments VSS, pt assisted to BSC, +BM, dependent for hygiene s/p BM    Pertinent Vitals/ Pain       Pain Assessment Pain Assessment: Faces Pain Score: 4  Faces Pain Scale: Hurts little more Pain Location: L chest Pain Descriptors / Indicators: Discomfort Pain Intervention(s): Limited activity within patient's tolerance, Monitored during session, Repositioned  Home Living                                          Prior Functioning/Environment              Frequency  Min 1X/week        Progress Toward Goals  OT Goals(current goals can now be found in the care plan section)  Progress towards OT goals: Progressing toward goals  Acute Rehab OT Goals Patient Stated Goal: to rest OT Goal Formulation: With patient Time For Goal Achievement: 09/28/22 Potential to Achieve Goals: Fair ADL Goals Pt Will Perform Grooming: with contact guard assist;sitting Pt Will Perform Upper Body Bathing: with min assist;sitting Pt Will Perform Upper Body Dressing: with mod assist;sitting Pt Will Transfer to Toilet: with min assist;stand pivot transfer;bedside commode Additional ADL Goal #1: Tolerate sitting EOB up to 5 min  with supervision to increase independince with toileting.  Plan Discharge plan remains appropriate    Co-evaluation                 AM-PAC OT "6 Clicks" Daily Activity     Outcome Measure   Help from another person eating meals?: A Little Help from another person taking care of personal grooming?: A Little Help from another person toileting, which includes using toliet, bedpan, or urinal?: A Lot Help from another person bathing (including washing, rinsing, drying)?: A Lot Help from another person to put on and taking off regular upper body clothing?: A Lot Help from another person to put on and taking off regular lower body clothing?: A Lot 6 Click Score: 14    End of Session    OT Visit Diagnosis: Unsteadiness on feet (R26.81);Other abnormalities of gait and mobility (R26.89);Muscle weakness (generalized) (M62.81)   Activity Tolerance Patient tolerated treatment well   Patient Left in bed;with call bell/phone within reach;with bed alarm set (in bed for ECHO)   Nurse Communication Mobility status  Time: 4098-1191 OT Time Calculation (min): 31 min  Charges: OT General Charges $OT Visit: 1 Visit OT Treatments $Self Care/Home Management : 8-22 mins $Therapeutic Activity: 8-22 mins  Charles Fila, OTD, OTR/L SecureChat Preferred Acute Rehab [phone]   Charles Holmes 09/25/2022, 1:49 PM

## 2022-09-25 NOTE — Progress Notes (Signed)
13 Days Post-Op Procedure(s) (LRB): RIGHT HEART CATH (N/A) Subjective: Patient alert and appropriate but somewhat upset over a problem with his breakfast order Alert and comfortable Looking forward to visiting with his daughter and new granddaughter today No significant problems over the weekend Receiving IV antibiotics to cover positive blood culture of Enterococcus on 9-6.  He has been afebrile with normal white count.  Origin probably UTI  Objective: Vital signs in last 24 hours: Temp:  [97.4 F (36.3 C)-98 F (36.7 C)] 97.8 F (36.6 C) (09/09 1145) Pulse Rate:  [66-149] 87 (09/09 1145) Cardiac Rhythm: Ventricular paced (09/09 0700) Resp:  [15-21] 16 (09/09 0500) BP: (94-106)/(67-89) 97/80 (09/09 0904) SpO2:  [92 %-96 %] 92 % (09/09 0753) Weight:  [80.1 kg] 80.1 kg (09/09 0500)  Hemodynamic parameters for last 24 hours:  Normal sinus rhythm Mean blood pressure stable off pressors  Intake/Output from previous day: 09/08 0701 - 09/09 0700 In: 240 [P.O.:240] Out: -  Intake/Output this shift: Total I/O In: 200 [IV Piggyback:200] Out: 400 [Urine:400]       Exam    General- alert and comfortable    Neck- no JVD, no cervical adenopathy palpable, no carotid bruit   Lungs- clear without rales, wheezes.  Sternal incision healed.  Chest tube sites healing.   Cor- regular rate, normal VAD hum   Abdomen- soft, non-tender   Extremities - warm, non-tender, minimal edema   Neuro- oriented, appropriate, left-sided weakness continues to slowly improve   Lab Results: Recent Labs    09/24/22 0841 09/25/22 0220  WBC 10.1 9.5  HGB 8.7* 9.5*  HCT 28.6* 32.0*  PLT 283 286   BMET:  Recent Labs    09/24/22 0538 09/25/22 0220  NA 137 139  K 4.4 4.3  CL 100 101  CO2 26 29  GLUCOSE 74 77  BUN 16 12  CREATININE 0.70 0.65  CALCIUM 8.3* 8.7*    PT/INR:  Recent Labs    09/25/22 0220  LABPROT 25.9*  INR 2.3*   ABG    Component Value Date/Time   PHART 7.365 08/31/2022  1504   HCO3 33.2 (H) 09/12/2022 1108   HCO3 31.2 (H) 09/12/2022 1108   TCO2 35 (H) 09/12/2022 1108   TCO2 33 (H) 09/12/2022 1108   ACIDBASEDEF 4.0 (H) 08/31/2022 1504   O2SAT 66.9 09/16/2022 0539   CBG (last 3)  Recent Labs    09/24/22 2137 09/25/22 0625 09/25/22 1143  GLUCAP 99 77 79    Assessment/Plan: S/P Procedure(s) (LRB): RIGHT HEART CATH (N/A) Patient central line is removed, pacing wires removed and chest tube sutures removed. He took short walk in the hallway over the weekend.   When his mobility is adequate he will benefit from CIR therapy.   LOS: 46 days    Lovett Sox 09/25/2022

## 2022-09-25 NOTE — Progress Notes (Signed)
CSW attempted to visit patient at bedside although resting comfortably. CSW will continue to attempt visits and follow throughout hospitalization. Lasandra Beech, LCSW, CCSW-MCS [phone]

## 2022-09-25 NOTE — Progress Notes (Signed)
ANTICOAGULATION CONSULT NOTE  Pharmacy Consult for heparin >warfarin Indication:  LVAD  Allergies  Allergen Reactions   Amiodarone     Severe tremors   Percocet [Oxycodone-Acetaminophen] Itching    Patient Measurements: Height: 5\' 7"  (170.2 cm) Weight: 80.1 kg (176 lb 9.4 oz) IBW/kg (Calculated) : 66.1 Heparin Dosing Weight: 87kg  Vital Signs: Temp: 98 F (36.7 C) (09/09 0753) Temp Source: Oral (09/09 0753) BP: 101/72 (09/09 0753) Pulse Rate: 69 (09/09 0753)  Labs: Recent Labs    09/22/22 0919 09/23/22 0224 09/24/22 0538 09/24/22 0841 09/25/22 0220  HGB 9.2*  --   --  8.7* 9.5*  HCT 30.8*  --   --  28.6* 32.0*  PLT 400  --   --  283 286  LABPROT  --  21.8* 24.4*  --  25.9*  INR  --  1.9* 2.2*  --  2.3*  CREATININE  --  0.86 0.70  --  0.65    Estimated Creatinine Clearance: 100.8 mL/min (by C-G formula based on SCr of 0.65 mg/dL).   Medical History: Past Medical History:  Diagnosis Date   CAD (coronary artery disease)    CHF (congestive heart failure) (HCC)    GERD (gastroesophageal reflux disease)    Hyperlipidemia    Hypertension    Systolic heart failure (HCC) 2021   LVEF 18%, RVEF 38% on cardiac MRI 12/19/2019. possible cardiac sarcoidosis.   Wide-complex tachycardia 2021   wears LifeVest     Assessment: 57yoM on apixaban PTA for hx AF admitted for LVAD workup. Pt s/p HM3 implant on 8/13 c/b acute CVA postop. Head CT 8/17 stable, ok to start low dose heparin per neuro, warfarin started 8/19.  INR remains therapeutic at 2.3 today. Hgb stable at 9.5, plt 286. LDH stable at 285. No s/sx of bleeding. Oral intake is still low, 2 ensures consumed yesterday.   Goal of Therapy:  INR 2-3 Monitor platelets by anticoagulation protocol: Yes   Plan:  Warfarin 2 mg tonight  Continue marinol and remeron for now  Daily INR, CBC  Thank you for allowing pharmacy to participate in this patient's care,  Sherron Monday, PharmD, BCCCP Clinical Pharmacist   Phone: [phone] 09/25/2022 8:30 AM  Please check AMION for all [facility] Pharmacy phone numbers After 10:00 PM, call Main Pharmacy [phone]

## 2022-09-25 NOTE — Progress Notes (Signed)
[facility]  Date of Admission:  08/10/2022     CC: Enterococcus Bacteremia  Abx: IV ampicillin and IV ceftriaxone   ASSESSMENT: # Nosocomial onset E faecalis bacteremia - 2/2 PICC ( removed 9/6), diarrhea ( resolved), r/o endocarditis as well as CIED  infection with PPM, LVAD+   PLAN: - Continue IV ampicillin and IV ceftriaxone. - TTE to rule out endocarditis.   Principal Problem:   Acute on chronic systolic CHF (congestive heart failure) (HCC) Active Problems:   HTN (hypertension)   AKI (acute kidney injury) (HCC)   CAD (coronary artery disease)   Ventricular tachycardia (HCC)   Cardiac sarcoidosis   History of CVA (cerebrovascular accident)   Paroxysmal atrial fibrillation (HCC)   Iron deficiency anemia   Chronic obstructive pulmonary disease (HCC)   Middle cerebral artery embolism, right   Allergies  Allergen Reactions   Amiodarone     Severe tremors   Percocet [Oxycodone-Acetaminophen] Itching    Scheduled Meds:  sodium chloride   Intravenous Once   aspirin EC  81 mg Oral Daily   atorvastatin  80 mg Oral Daily   Chlorhexidine Gluconate Cloth  6 each Topical Q0600   digoxin  0.125 mg Oral Daily   dorzolamide-timolol  1 drop Both Eyes BID   dronabinol  2.5 mg Oral BID AC   feeding supplement  237 mL Oral TID BM   Gerhardt's butt cream   Topical TID   insulin aspart  0-24 Units Subcutaneous TID WC & HS   latanoprost  1 drop Both Eyes QHS   melatonin  3 mg Oral QHS   mexiletine  250 mg Oral BID   midodrine  10 mg Oral Q8H   mirtazapine  7.5 mg Oral QHS   mometasone-formoterol  2 puff Inhalation BID   multivitamin with minerals  1 tablet Oral Daily   mouth rinse  15 mL Mouth Rinse 4 times per day   pantoprazole  40 mg Oral Daily   sertraline  50 mg Oral Daily   sildenafil  20 mg Oral TID   sodium chloride flush  3 mL Intravenous Q12H   umeclidinium bromide  1 puff Inhalation Daily   Warfarin - Pharmacist Dosing  Inpatient   Does not apply q1600   Continuous Infusions:  sodium chloride Stopped (09/13/22 2035)   ampicillin (OMNIPEN) IV 2 g (09/25/22 1316)   cefTRIAXone (ROCEPHIN)  IV 2 g (09/25/22 1138)   PRN Meds:.sodium chloride, acetaminophen, albuterol, dextrose, ondansetron (ZOFRAN) IV, mouth rinse, oxyCODONE, polyethylene glycol, sodium chloride, sodium chloride flush, traMADol   SUBJECTIVE: He is feeling. Denies fevers, rash or cough.  Repeat blood cultures negative @ 24 hrs.    Review of Systems: ROS All other ROS was negative, except mentioned above   OBJECTIVE: Vitals:   09/25/22 0753 09/25/22 0904 09/25/22 1139 09/25/22 1145  BP: 101/72 97/80    Pulse: 69  77 87  Resp:      Temp: 98 F (36.7 C)   97.8 F (36.6 C)  TempSrc: Oral   Oral  SpO2: 92%     Weight:      Height:       Body mass index is 27.66 kg/m.  Physical Exam   Lab Results Lab Results  Component Value Date   WBC 9.5 09/25/2022   HGB 9.5 (L) 09/25/2022   HCT 32.0 (L) 09/25/2022   MCV 93.8 09/25/2022   PLT 286 09/25/2022  Lab Results  Component Value Date   CREATININE 0.65 09/25/2022   BUN 12 09/25/2022   NA 139 09/25/2022   K 4.3 09/25/2022   CL 101 09/25/2022   CO2 29 09/25/2022    Lab Results  Component Value Date   ALT 65 (H) 09/20/2022   AST 46 (H) 09/20/2022   GGT 216 (H) 09/08/2022   ALKPHOS 120 09/20/2022   BILITOT 1.0 09/20/2022      Microbiology: Recent Results (from the past 240 hour(s))  Culture, blood (Routine X 2) w Reflex to ID Panel     Status: Abnormal   Collection Time: 09/22/22  8:54 AM   Specimen: BLOOD LEFT ARM  Result Value Ref Range Status   Specimen Description BLOOD LEFT ARM  Final   Special Requests   Final    BOTTLES DRAWN AEROBIC ONLY Blood Culture adequate volume   Culture  Setup Time   Final    GRAM POSITIVE COCCI AEROBIC BOTTLE ONLY CRITICAL RESULT CALLED TO, READ BACK BY AND VERIFIED WITH: PHARMD G. ABBOTT 09/23/22 @ 0117 BY AB Performed at  [facility], [address] [place], [place] [address]    Culture ENTEROCOCCUS FAECALIS (A)  Final   Report Status 09/24/2022 FINAL  Final   Organism ID, Bacteria ENTEROCOCCUS FAECALIS  Final      Susceptibility   Enterococcus faecalis - MIC*    AMPICILLIN <=2 SENSITIVE Sensitive     VANCOMYCIN 1 SENSITIVE Sensitive     GENTAMICIN SYNERGY SENSITIVE Sensitive     * ENTEROCOCCUS FAECALIS  Blood Culture ID Panel (Reflexed)     Status: Abnormal   Collection Time: 09/22/22  8:54 AM  Result Value Ref Range Status   Enterococcus faecalis DETECTED (A) NOT DETECTED Final    Comment: CRITICAL RESULT CALLED TO, READ BACK BY AND VERIFIED WITH: PHARMD G. ABBOTT 09/23/22 @ 0117 BY AB    Enterococcus Faecium NOT DETECTED NOT DETECTED Final   Listeria monocytogenes NOT DETECTED NOT DETECTED Final   Staphylococcus species NOT DETECTED NOT DETECTED Final   Staphylococcus aureus (BCID) NOT DETECTED NOT DETECTED Final   Staphylococcus epidermidis NOT DETECTED NOT DETECTED Final   Staphylococcus lugdunensis NOT DETECTED NOT DETECTED Final   Streptococcus species NOT DETECTED NOT DETECTED Final   Streptococcus agalactiae NOT DETECTED NOT DETECTED Final   Streptococcus pneumoniae NOT DETECTED NOT DETECTED Final   Streptococcus pyogenes NOT DETECTED NOT DETECTED Final   A.calcoaceticus-baumannii NOT DETECTED NOT DETECTED Final   Bacteroides fragilis NOT DETECTED NOT DETECTED Final   Enterobacterales NOT DETECTED NOT DETECTED Final   Enterobacter cloacae complex NOT DETECTED NOT DETECTED Final   Escherichia coli NOT DETECTED NOT DETECTED Final   Klebsiella aerogenes NOT DETECTED NOT DETECTED Final   Klebsiella oxytoca NOT DETECTED NOT DETECTED Final   Klebsiella pneumoniae NOT DETECTED NOT DETECTED Final   Proteus species NOT DETECTED NOT DETECTED Final   Salmonella species NOT DETECTED NOT DETECTED Final   Serratia marcescens NOT DETECTED NOT DETECTED Final   Haemophilus influenzae NOT DETECTED  NOT DETECTED Final   Neisseria meningitidis NOT DETECTED NOT DETECTED Final   Pseudomonas aeruginosa NOT DETECTED NOT DETECTED Final   Stenotrophomonas maltophilia NOT DETECTED NOT DETECTED Final   Candida albicans NOT DETECTED NOT DETECTED Final   Candida auris NOT DETECTED NOT DETECTED Final   Candida glabrata NOT DETECTED NOT DETECTED Final   Candida krusei NOT DETECTED NOT DETECTED Final   Candida parapsilosis NOT DETECTED NOT DETECTED Final   Candida  tropicalis NOT DETECTED NOT DETECTED Final   Cryptococcus neoformans/gattii NOT DETECTED NOT DETECTED Final   Vancomycin resistance NOT DETECTED NOT DETECTED Final    Comment: Performed at [facility], [address], [place], [place] [address]  Culture, blood (Routine X 2) w Reflex to ID Panel     Status: Abnormal   Collection Time: 09/22/22  8:55 AM   Specimen: BLOOD LEFT ARM  Result Value Ref Range Status   Specimen Description BLOOD LEFT ARM  Final   Special Requests   Final    BOTTLES DRAWN AEROBIC ONLY Blood Culture adequate volume   Culture  Setup Time   Final    GRAM POSITIVE COCCI AEROBIC BOTTLE ONLY CRITICAL VALUE NOTED.  VALUE IS CONSISTENT WITH PREVIOUSLY REPORTED AND CALLED VALUE.    Culture (A)  Final    ENTEROCOCCUS FAECALIS SUSCEPTIBILITIES PERFORMED ON PREVIOUS CULTURE WITHIN THE LAST 5 DAYS. Performed at [facility], [address], [place], [place] [address]    Report Status 09/24/2022 FINAL  Final  Culture, blood (Routine X 2) w Reflex to ID Panel     Status: None (Preliminary result)   Collection Time: 09/23/22 12:12 PM   Specimen: BLOOD LEFT HAND  Result Value Ref Range Status   Specimen Description BLOOD LEFT HAND  Final   Special Requests   Final    BOTTLES DRAWN AEROBIC AND ANAEROBIC Blood Culture adequate volume   Culture   Final    NO GROWTH 2 DAYS Performed at [facility], [address] [place], [place] [address]    Report Status PENDING  Incomplete  Culture, blood  (Routine X 2) w Reflex to ID Panel     Status: None (Preliminary result)   Collection Time: 09/23/22 12:16 PM   Specimen: BLOOD LEFT ARM  Result Value Ref Range Status   Specimen Description BLOOD LEFT ARM  Final   Special Requests   Final    BOTTLES DRAWN AEROBIC AND ANAEROBIC Blood Culture adequate volume   Culture   Final    NO GROWTH 2 DAYS Performed at [facility], [address], [place], [place] [address]    Report Status PENDING  Incomplete  Culture, blood (Routine X 2) w Reflex to ID Panel     Status: None (Preliminary result)   Collection Time: 09/24/22  5:39 AM   Specimen: BLOOD LEFT HAND  Result Value Ref Range Status   Specimen Description BLOOD LEFT HAND  Final   Special Requests   Final    BOTTLES DRAWN AEROBIC ONLY Blood Culture adequate volume   Culture   Final    NO GROWTH 1 DAY Performed at [facility], [address], [place], [place] [address]    Report Status PENDING  Incomplete  Culture, blood (Routine X 2) w Reflex to ID Panel     Status: None (Preliminary result)   Collection Time: 09/24/22  5:40 AM   Specimen: BLOOD LEFT HAND  Result Value Ref Range Status   Specimen Description BLOOD LEFT HAND  Final   Special Requests   Final    BOTTLES DRAWN AEROBIC ONLY Blood Culture results may not be optimal due to an inadequate volume of blood received in culture bottles   Culture   Final    NO GROWTH 1 DAY Performed at [facility], [address], [place], [place] [address]    Report Status PENDING  Incomplete     Serology:  Imaging: If present, new imagings (plain films, ct scans, and mri) have been personally visualized and interpreted; radiology reports have been reviewed. Decision making incorporated into the Impression / Recommendations.   Laretta Bolster, MD [facility] for [facility] Disease [facility] [phone] pager    09/25/2022, 1:36 PM

## 2022-09-25 NOTE — Progress Notes (Signed)
Inpatient Rehab Admissions Coordinator:    Pt. Progressing with therapies, more motivated to participate in rehab today. Per Dr. Gala Romney, should be stable for d/c in the next 1-2 days. I will send case to insurance and pursue for admit.   Megan Salon, MS, CCC-SLP Rehab Admissions Coordinator  [phone] (celll) [phone] (office)

## 2022-09-25 NOTE — Progress Notes (Signed)
  Echocardiogram 2D Echocardiogram has been performed.  Leda Roys RDCS 09/25/2022, 3:01 PM

## 2022-09-25 NOTE — Progress Notes (Signed)
Physical Therapy Treatment Patient Details Name: Charles Holmes MRN: [id number] DOB: 1965/03/14 Today's Date: 09/25/2022   History of Present Illness Pt is 57 year old presented to [facility] on  08/10/22 for acute on chronic systolic heart failure. Pt had IABP placed on 08/25/22 for ongoing cardiogenic shock. Underwent placement of LVAD on 8/13. Developed lt sided weakness on 8/14. Rt MCA CVA. Underwent mechanical thrombectomy by IR. Extubated 8/15.  PMH - CAD, CHF, HLD, HTN, systolic HF, ICD implant, rt CVA.  LVAD placement and new stroke R MCA M1.    PT Comments  Pt with significantly improved spirits this date and was excited after seeing his new granddaughter this morning. Pt with improved ambulation tolerance with Carley Hammed walker this date in addition to overall activity tolerance. Pt continues with L sided weakness UE > LE inhibiting pt from efficient ability to manage LVAD when switching to batteries. Pt to benefit from intense inpatient rehab program > 3hrs to address above deficits and progress towards indep for safe transition home with family. Acute PT to cont to follow.    If plan is discharge home, recommend the following: Assistance with cooking/housework;Direct supervision/assist for medications management;Assist for transportation   Can travel by private vehicle        Equipment Recommendations  Other (comment) (To be determined)    Recommendations for Other Services       Precautions / Restrictions Precautions Precautions: Sternal;Fall Precaution Comments: LVAD Restrictions Weight Bearing Restrictions: No RUE Weight Bearing: Non weight bearing RLE Weight Bearing: Non weight bearing Other Position/Activity Restrictions: Sternal Precautions     Mobility  Bed Mobility Overal bed mobility: Needs Assistance Bed Mobility: Rolling, Sidelying to Sit Rolling: Min assist Sidelying to sit: Mod assist, HOB elevated       General bed mobility comments: max directional cues to roll to  the L and bring trunk up from sidelying, modA for trunk elevation due to limited ability to push up from L elbow due to weakness    Transfers Overall transfer level: Needs assistance Equipment used: 1 person hand held assist Transfers: Sit to/from Stand, Bed to chair/wheelchair/BSC Sit to Stand: Min assist   Step pivot transfers: Min assist       General transfer comment: pt minA from elevated bed to power up, minA to steady during step pvt transfer to [facility] and then back to EOB. decreased L LE step height but no L knee buckling this date    Ambulation/Gait Ambulation/Gait assistance: Mod assist, +2 safety/equipment Gait Distance (Feet): 120 Feet Assistive device: Fara Boros Gait Pattern/deviations: Step-through pattern, Decreased step length - right, Decreased step length - left, Trunk flexed Gait velocity: decr Gait velocity interpretation: <1.31 ft/sec, indicative of household ambulator   General Gait Details: pt requires minA to help guide eva walker, pt very dependent on Eva walker, limited use of L UE to suport self on regular RW, 2nd person for line management, constant verbal cues to stand up tall and look forward   Stairs             Wheelchair Mobility     Tilt Bed    Modified Rankin (Stroke Patients Only) Modified Rankin (Stroke Patients Only) Pre-Morbid Rankin Score: No symptoms Modified Rankin: Moderately severe disability     Balance Overall balance assessment: Needs assistance Sitting-balance support: Feet supported, No upper extremity supported Sitting balance-Leahy Scale: Fair Sitting balance - Comments: once in supportive position with bilat feet on the floor pt able to maintain static sitting EOB  balance however requires unilateral UE support during dynamic sitting balance   Standing balance support: Bilateral upper extremity supported Standing balance-Leahy Scale: Poor Standing balance comment: min assist for static standing while PT performed  hygiene s/p BM                            Cognition Arousal: Alert Behavior During Therapy: WFL for tasks assessed/performed Overall Cognitive Status: Impaired/Different from baseline Area of Impairment: Safety/judgement, Problem solving, Memory                     Memory: Decreased recall of precautions Following Commands: Follows one step commands consistently Safety/Judgement: Decreased awareness of deficits Awareness: Emergent Problem Solving: Slow processing General Comments: pt with improved spirits and motivation this date. Was able to go outside and meet his new granddaughter earlier this morning. Pt remains to have decreased insight to safety, delayed processing but is overall improving. Pt remembers sternal precautions and tried to implement them during session        Exercises      General Comments General comments (skin integrity, edema, etc.): VSS, pt assisted to BSC, +BM, dependent for hygiene s/p BM      Pertinent Vitals/Pain Pain Assessment Pain Assessment: Faces Faces Pain Scale: Hurts little more Pain Location: L chest Pain Descriptors / Indicators: Discomfort    Home Living                          Prior Function            PT Goals (current goals can now be found in the care plan section) Acute Rehab PT Goals PT Goal Formulation: With patient Time For Goal Achievement: 09/29/22 Potential to Achieve Goals: Fair Progress towards PT goals: Progressing toward goals    Frequency    Min 1X/week      PT Plan Current plan remains appropriate    Co-evaluation              AM-PAC PT "6 Clicks" Mobility   Outcome Measure  Help needed turning from your back to your side while in a flat bed without using bedrails?: A Lot Help needed moving from lying on your back to sitting on the side of a flat bed without using bedrails?: A Lot Help needed moving to and from a bed to a chair (including a wheelchair)?: A  Lot Help needed standing up from a chair using your arms (e.g., wheelchair or bedside chair)?: A Lot Help needed to walk in hospital room?: A Lot Help needed climbing 3-5 steps with a railing? : Total 6 Click Score: 11    End of Session Equipment Utilized During Treatment: Gait belt Activity Tolerance: Patient tolerated treatment well Patient left: in chair (with OT) Nurse Communication: Mobility status PT Visit Diagnosis: Other abnormalities of gait and mobility (R26.89);Difficulty in walking, not elsewhere classified (R26.2);Hemiplegia and hemiparesis Hemiplegia - Right/Left: Left Hemiplegia - dominant/non-dominant: Non-dominant Hemiplegia - caused by: Cerebral infarction     Time: 1223-1255 PT Time Calculation (min) (ACUTE ONLY): 32 min  Charges:    $Gait Training: 8-22 mins $Therapeutic Activity: 8-22 mins PT General Charges $$ ACUTE PT VISIT: 1 Visit                     [name], PT, DPT Acute Rehabilitation Services Secure chat preferred Office #: [phone]    [name]  09/25/2022, 1:32 PM

## 2022-09-25 NOTE — Progress Notes (Signed)
This chaplain is present for F/U spiritual care. The Pt. is participating in Pt. and family education at the time of the visit. This chaplain will plan a revisit.  Chaplain Stephanie Acre [phone]

## 2022-09-25 NOTE — Progress Notes (Signed)
LVAD Coordinator Rounding Note:  Admitted 08/10/22 due to acute on chronic CHF with cardiogenic shock. Milrinone dependent. Advance therapy workup completed, and pt deemed acceptable VAD candidate. Dental extractions completed 8/6. IABP placed 08/25/22.  HM 3 LVAD implanted on 08/29/22 by Dr Donata Clay under destination therapy criteria. 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 Result negative.  7/25 Admit with cardiogenic shock. Started milrinone and NE. 8/6 S/P 13 teeth extractions  8/9 IABP placed 8/13 S/p HM III LVAD implant + clipping left atrial appendage d/t severe thickening and invagination of mitral valve annulus impeding flows  CT Head 8/14 (initial) 1. 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 ASPECTS is 7. 2. No acute hemorrhage.  CT Angio Head/Neck 1. 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 2. Core infarct of 12 cc (somewhat underestimated compared to aspects) with 90 cc of penumbra. 3. Mild atherosclerosis.  Pt taken emergently to IR for percutaneous right common carotid arteriogram with thrombectomy. Revascularization achieved. Angio-seal closure device applied to left groin- clean, dry, and intact.   CT Head 8/15 @ 0450 Unchanged extent of infarct and hemorrhage in the right MCA distribution including small volume intraventricular clot. No hydrocephalus.  CT Head 8/17 @ 0701 Interval evolution of the right MCA territory infarct with decreased intraparenchymal and intraventricular hemorrhage. No hydrocephalus or midline shift.   Pt lying in bed resting on my arrival. Denies complaints. Pt taken outside to meet his new granddaughter and visit with family by myself and Sarah VAD Coordinator. Pt in better spirits today and willing to participate in therapy.  WBC trending down 9.5 today. Blood cultures pending. Blood cultures from 9/6 positive for Enterococcus Faecalis. Subsequent blood cultures pending. ID  following.  Pt working with PT/OT/SLP. CIR consulted once medically stable. Pt encouraged to participate in therapy.   VAD coordinator/nurses working with pt on changing power sources. He is unable to do this due to his left sided weakness. Modification of his power cords was made in hopes he can grip the cords better. Has practice power cords for use by pt. See below for education documentation.   Vital signs: Temp: 98 HR: 80 Doppler Pressure: 92 Automatic BP: 97/80 (87) O2 Sat: 95% on RA Wt: 183.6>191.1>190.9>190.2>184.8>178.8>178.1>172.4>170.6>173.2>167.9>167.1>163.1>162.5>171.3>172.4>176.6 lbs   LVAD interrogation reveals:  Speed: 5400 Flow: 4.6 Power: 4.0 w PI: 2.6  Alarms: none Events: 5-10 PI events  Hematocrit: 32 Fixed speed: 5400 Low speed limit: 5100  Drive Line: Pt's [name] performed dressing change with VAD coordinator supervision and verbal cues. Existing VAD dressing removed and site care performed using sterile technique. Drive line exit site cleaned with Chlora prep applicators x 2, allowed to dry, and Vashe moistened 2x2 placed around driveline then covered with dry 4x4. Exit site healed and incorporated, the velour is fully implanted at exit site. Small amount of serous drainage.Slight redness, no tenderness, foul odor or rash noted. Proximal suture remains. Drive line anchor re-applied. Pt denies fever or chills. Continue Monday/ Wednesday/Friday dressing changes. Next dressing change due 09/27/22 by VAD coordinator or nurse champion only.    Labs:  LDH trend: 596>441>165>384>345>312>312>350>327>309>316>305>298>277>273>298>280>285  INR trend: 1.4>1.6>1.1>1.1>1.3>1.2>1>1.3>1.4>1.5>1.6>2.8>2.8>2.3>2.2>2.3  AST/ALT trend: 218/45>96/30>73/23>63/36>58/37>69/50>93/72>123/103>143/150>117/138>119/138>92/127>87/123>50/71>46/65  Total Bili trend: 4.4>4.6>5.1>1.8>1.9>1.2>1.0>1.5>1.1>0.8>0.9>1.0  WBC trend:  11.6>13.7>19.6>16.1>23>26.6>21>24.6>18.1>16.5>13.2>13.1>12.5>11.6>10.5>10.8>13.1>9.5  Anticoagulation Plan: -INR Goal: 2.0 - 2.5 -ASA Dose: 81 mg  - Coumadin dosing per pharmacy  Blood Products:  IntraOp 8/14: - 4 FFP - 2 Platelets - 2 PRBC - 1 cyro - 449 cc of cellsaver -  DDAVP 20 mcg x 1   Device: Medtronic BiV -  -Therapies: OFF  Arrythmias:   Respiratory: RA  Infection:  09/01/22>> sputum cx>> NGTD>>final 09/01/22>> blood cx>> NGTD>>final 09/01/22>> urine cx>> NGTD>> final 09/13/22>>blood cxs>>no growth 5 days; final 09/22/22>>blood cxs>> Enterococcus faecalis  09/23/22>> blood cxs>> pending 09/24/22>> blood cxs>> pending  Renal:  9/9: BUN/CRT: 12/.65  Adverse Events on VAD: - 08/30/22: - Developed left sided weakness this am. CTA with R MCA infarct. Taken to IR for thrombectomy   Drips:    Patient Education: Attempted to work on power source change with practice power cords with pt. Pt reluctant to participate. VAD coordinator provided hands on assistance and frequent verbal cues. Pt able to unscrew power cord connection, but was unable to pull apart/reconnect power cords without assistance. Left extremity too weak at this time to perform power change on his own. Coban reinforced on practice power leads to assist with grip.  Discharge teaching completed with pt's [name] on 9/5. See separate note for documentation.  VAD coordinator supervised Roberta changing VAD dressing. Will plan for her to perform dressing change again on Wednesday.   Plan/Recommendations:  Please page VAD coordinator for any alarms or VAD equipment issues. Continue MWF dressing changes by VAD coordinator or Nurse Alla Feeling.  Simmie Davies RN,BSN VAD Coordinator  Office: [phone]  24/7 Pager: [phone]

## 2022-09-26 DIAGNOSIS — Z95811 Presence of heart assist device: Secondary | ICD-10-CM | POA: Diagnosis not present

## 2022-09-26 DIAGNOSIS — J449 Chronic obstructive pulmonary disease, unspecified: Secondary | ICD-10-CM | POA: Diagnosis not present

## 2022-09-26 DIAGNOSIS — I5023 Acute on chronic systolic (congestive) heart failure: Secondary | ICD-10-CM | POA: Diagnosis not present

## 2022-09-26 DIAGNOSIS — B952 Enterococcus as the cause of diseases classified elsewhere: Secondary | ICD-10-CM | POA: Diagnosis not present

## 2022-09-26 DIAGNOSIS — R531 Weakness: Secondary | ICD-10-CM

## 2022-09-26 DIAGNOSIS — R7881 Bacteremia: Secondary | ICD-10-CM | POA: Diagnosis not present

## 2022-09-26 DIAGNOSIS — I428 Other cardiomyopathies: Secondary | ICD-10-CM | POA: Diagnosis not present

## 2022-09-26 LAB — BASIC METABOLIC PANEL
Anion gap: 11 (ref 5–15)
BUN: 12 mg/dL (ref 6–20)
CO2: 27 mmol/L (ref 22–32)
Calcium: 8.5 mg/dL — ABNORMAL LOW (ref 8.9–10.3)
Chloride: 99 mmol/L (ref 98–111)
Creatinine, Ser: 0.67 mg/dL (ref 0.61–1.24)
GFR, Estimated: >60 mL/min (ref >60)
Glucose, Bld: 77 mg/dL (ref 70–99)
Potassium: 3.7 mmol/L (ref 3.5–5.1)
Sodium: 137 mmol/L (ref 135–145)

## 2022-09-26 LAB — GLUCOSE, CAPILLARY
Glucose-Capillary: 83 mg/dL (ref 70–99)
Glucose-Capillary: 126 mg/dL — ABNORMAL HIGH (ref 70–99)
Glucose-Capillary: 103 mg/dL — ABNORMAL HIGH (ref 70–99)
Glucose-Capillary: 120 mg/dL — ABNORMAL HIGH (ref 70–99)

## 2022-09-26 LAB — CBC
HCT: 30.5 % — ABNORMAL LOW (ref 39.0–52.0)
Hemoglobin: 9.2 g/dL — ABNORMAL LOW (ref 13.0–17.0)
MCH: 27.8 pg (ref 26.0–34.0)
MCHC: 30.2 g/dL (ref 30.0–36.0)
MCV: 92.1 fL (ref 80.0–100.0)
Platelets: 283 10*3/uL (ref 150–400)
RBC: 3.31 MIL/uL — ABNORMAL LOW (ref 4.22–5.81)
RDW: 18.2 % — ABNORMAL HIGH (ref 11.5–15.5)
WBC: 9.1 10*3/uL (ref 4.0–10.5)
nRBC: 0 % (ref 0.0–0.2)

## 2022-09-26 LAB — BRAIN NATRIURETIC PEPTIDE: B Natriuretic Peptide: 851.4 pg/mL — ABNORMAL HIGH (ref 0.0–100.0)

## 2022-09-26 LAB — MAGNESIUM: Magnesium: 1.8 mg/dL (ref 1.7–2.4)

## 2022-09-26 LAB — LACTATE DEHYDROGENASE: LDH: 251 U/L — ABNORMAL HIGH (ref 98–192)

## 2022-09-26 LAB — PROTIME-INR
INR: 2.6 — ABNORMAL HIGH (ref 0.8–1.2)
Prothrombin Time: 28.1 s — ABNORMAL HIGH (ref 11.4–15.2)

## 2022-09-26 MED ORDER — MIDODRINE HCL 5 MG PO TABS
5.0000 mg | ORAL_TABLET | Freq: Three times a day (TID) | ORAL | Status: DC
  Administered 2022-09-26 – 2022-09-27 (×4): 5 mg via ORAL
  Filled 2022-09-26 (×4): qty 1

## 2022-09-26 MED ORDER — MAGNESIUM SULFATE 2 GM/50ML IV SOLN
2.0000 g | Freq: Once | INTRAVENOUS | Status: AC
  Administered 2022-09-26: 2 g via INTRAVENOUS
  Filled 2022-09-26: qty 50

## 2022-09-26 MED ORDER — WARFARIN 0.5 MG HALF TABLET
0.5000 mg | ORAL_TABLET | Freq: Once | ORAL | Status: AC
  Administered 2022-09-26: 0.5 mg via ORAL
  Filled 2022-09-26: qty 1

## 2022-09-26 MED ORDER — POTASSIUM CHLORIDE CRYS ER 20 MEQ PO TBCR
40.0000 meq | EXTENDED_RELEASE_TABLET | Freq: Once | ORAL | Status: AC
  Administered 2022-09-26: 40 meq via ORAL
  Filled 2022-09-26: qty 2

## 2022-09-26 NOTE — Progress Notes (Signed)
Inpatient Rehabilitation Admissions Coordinator   I await insurance approval for possible CIR admit.  Ottie Glazier, RN, MSN Rehab Admissions Coordinator [phone] 09/26/2022 11:47 AM

## 2022-09-26 NOTE — Progress Notes (Signed)
LVAD Coordinator Rounding Note:  Admitted 08/10/22 due to acute on chronic CHF with cardiogenic shock. Milrinone dependent. Advance therapy workup completed, and pt deemed acceptable VAD candidate. Dental extractions completed 8/6. IABP placed 08/25/22.  HM 3 LVAD implanted on 08/29/22 by Dr Donata Clay under destination therapy criteria. 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 Result negative.  7/25 Admit with cardiogenic shock. Started milrinone and NE. 8/6 S/P 13 teeth extractions  8/9 IABP placed 8/13 S/p HM III LVAD implant + clipping left atrial appendage d/t severe thickening and invagination of mitral valve annulus impeding flows  CT Head 8/14 (initial) 1. 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 ASPECTS is 7. 2. No acute hemorrhage.  CT Angio Head/Neck 1. 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 2. Core infarct of 12 cc (somewhat underestimated compared to aspects) with 90 cc of penumbra. 3. Mild atherosclerosis.  Pt taken emergently to IR for percutaneous right common carotid arteriogram with thrombectomy. Revascularization achieved. Angio-seal closure device applied to left groin- clean, dry, and intact.   CT Head 8/15 @ 0450 Unchanged extent of infarct and hemorrhage in the right MCA distribution including small volume intraventricular clot. No hydrocephalus.  CT Head 8/17 @ 0701 Interval evolution of the right MCA territory infarct with decreased intraparenchymal and intraventricular hemorrhage. No hydrocephalus or midline shift.   Pt lying in bed asleep on my arrival. VAD Coordinator will return this afternoon for continued education on power source change.  WBC trending down 9.5 today. Blood cultures pending. Blood cultures from 9/6 positive for Enterococcus Faecalis. Subsequent blood cultures pending. ID following.  Pt working with PT/OT/SLP. CIR consulted once medically stable. Pt encouraged to  participate in therapy.   VAD coordinator/nurses working with pt on changing power sources. He is unable to do this due to his left sided weakness. Modification of his power cords was made in hopes he can grip the cords better. Has practice power cords for use by pt. See below for education documentation.   Vital signs: Temp: 98 HR: 73 Doppler Pressure: 92 Automatic BP: 108/87 (95) O2 Sat: 91% on RA Wt: 183.6>191.1>190.9>190.2>184.8>178.8>178.1>172.4>170.6>173.2>167.9>167.1>163.1>162.5>171.3>172.4>176.6> 180.6lbs   LVAD interrogation reveals:  Speed: 5400 Flow: 4.1 Power: 3.9 w PI: 4.8  Alarms: none Events: rare  Hematocrit: 32 Fixed speed: 5400 Low speed limit: 5100  Drive Line: Existing VAD dressing removed and site care performed using sterile technique. Anchor secure. Continue Monday/ Wednesday/Friday dressing changes. Next dressing change due 09/27/22 by VAD coordinator or nurse champion only.    Labs:  LDH trend: 596>441>165>384>345>312>312>350>327>309>316>305>298>277>273>298>280>285>251  INR trend: 1.4>1.6>1.1>1.1>1.3>1.2>1>1.3>1.4>1.5>1.6>2.8>2.8>2.3>2.2>2.3>2.6  AST/ALT trend: 218/45>96/30>73/23>63/36>58/37>69/50>93/72>123/103>143/150>117/138>119/138>92/127>87/123>50/71>46/65  Total Bili trend: 4.4>4.6>5.1>1.8>1.9>1.2>1.0>1.5>1.1>0.8>0.9>1.0  WBC trend: 11.6>13.7>19.6>16.1>23>26.6>21>24.6>18.1>16.5>13.2>13.1>12.5>11.6>10.5>10.8>13.1>9.5>9.1  Anticoagulation Plan: -INR Goal: 2.0 - 2.5 -ASA Dose: 81 mg  - Coumadin dosing per pharmacy  Blood Products:  IntraOp 8/14: - 4 FFP - 2 Platelets - 2 PRBC - 1 cyro - 449 cc of cellsaver - DDAVP 20 mcg x 1   Device: Medtronic BiV -  -Therapies: OFF  Arrythmias:   Respiratory: RA  Infection:  09/01/22>> sputum cx>> NGTD>>final 09/01/22>> blood cx>> NGTD>>final 09/01/22>> urine cx>> NGTD>> final 09/13/22>>blood cxs>>no growth 5 days; final 09/22/22>>blood cxs>> Enterococcus faecalis  09/23/22>> blood cxs>>  pending 09/24/22>> blood cxs>> pending  Renal:  9/9: BUN/CRT: 12/.65  Adverse Events on VAD: - 08/30/22: - Developed left sided weakness this am. CTA with R MCA infarct. Taken to IR for thrombectomy   Drips:    Patient Education: Discharge teaching  completed with pt's [name] on 9/5. See separate note for documentation.   Plan/Recommendations:  Please page VAD coordinator for any alarms or VAD equipment issues. Continue MWF dressing changes by VAD coordinator or Nurse Alla Feeling.  Simmie Davies RN,BSN VAD Coordinator  Office: [phone]  24/7 Pager: [phone]

## 2022-09-26 NOTE — Progress Notes (Signed)
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 This is reasonable and agree he'll need long term suppression after that   6 wk amp/ceftriaxone followed by chronic amoxicillin suppression   ID will sign off Discussed with cards    OPAT Orders Discharge antibiotics to be given via PICC line Discharge antibiotics: Amp/ceftriaxone iv 6 weeks  Followed by amoxicillin 1 gram bid suppression   Duration: 6 weeks from 9/7  End Date: 11/04/22  [facility] Care Per Protocol:  Home health RN for IV administration and teaching; PICC line care and labs.    Labs weekly while on IV antibiotics: _x_ CBC with differential __ BMP _x_ CMP __ CRP __ ESR __ Vancomycin trough __ CK  __ Please pull PIC at completion of IV antibiotics __ Please leave PIC in place until doctor has seen patient or been notified  Fax weekly labs to [phone]  Clinic Follow Up Appt: 10/2 @ 230 with dr Renold Don  @  [facility] [address], [place], [place] [address] Phone: [phone]  ---------------- Subjective No change No complaint  Afebrile Repeat bcx ngtd   Objectives Vitals:   09/26/22 1123 09/26/22 1300  BP: 99/83 109/66  Pulse: (!) 119   Resp: 16   Temp: 98.2 F (36.8 C)   SpO2: 94%    General/constitutional: no distress, pleasant HEENT: Normocephalic CV: rr; pacer site nontender Lungs: normal respiratory effort Abd: Soft, Nontender Ext: no edema Skin: No Rash Neuro: nonfocal   Lvad exit driveline site dressing c/d    Labs: Lab Results  Component Value Date   WBC 9.1 09/26/2022   HGB 9.2 (L) 09/26/2022   HCT 30.5 (L) 09/26/2022   MCV 92.1 09/26/2022   PLT 283 09/26/2022   Last metabolic panel Lab Results   Component Value Date   GLUCOSE 77 09/26/2022   NA 137 09/26/2022   K 3.7 09/26/2022   CL 99 09/26/2022   CO2 27 09/26/2022   BUN 12 09/26/2022   CREATININE 0.67 09/26/2022   GFRNONAA >60 09/26/2022   CALCIUM 8.5 (L) 09/26/2022   PHOS 2.9 09/05/2022   PROT 6.6 09/20/2022   ALBUMIN 2.7 (L) 09/20/2022   BILITOT 1.0 09/20/2022   ALKPHOS 120 09/20/2022   AST 46 (H) 09/20/2022   ALT 65 (H) 09/20/2022   ANIONGAP 11 09/26/2022   Imaging: None today

## 2022-09-26 NOTE — Plan of Care (Signed)
  Problem: Education: Goal: Understanding of CV disease, CV risk reduction, and recovery process will improve Outcome: Progressing Goal: Individualized Educational Video(s) Outcome: Progressing   Problem: Activity: Goal: Ability to return to baseline activity level will improve Outcome: Progressing   Problem: Cardiovascular: Goal: Ability to achieve and maintain adequate cardiovascular perfusion will improve Outcome: Progressing Goal: Vascular access site(s) Level 0-1 will be maintained Outcome: Progressing   Problem: Health Behavior/Discharge Planning: Goal: Ability to safely manage health-related needs after discharge will improve Outcome: Progressing   Problem: Education: Goal: Knowledge of General Education information will improve Description: Including pain rating scale, medication(s)/side effects and non-pharmacologic comfort measures Outcome: Progressing   Problem: Health Behavior/Discharge Planning: Goal: Ability to manage health-related needs will improve Outcome: Progressing   Problem: Clinical Measurements: Goal: Ability to maintain clinical measurements within normal limits will improve Outcome: Progressing Goal: Will remain free from infection Outcome: Progressing Goal: Diagnostic test results will improve Outcome: Progressing Goal: Respiratory complications will improve Outcome: Progressing Goal: Cardiovascular complication will be avoided Outcome: Progressing   Problem: Activity: Goal: Risk for activity intolerance will decrease Outcome: Progressing   Problem: Nutrition: Goal: Adequate nutrition will be maintained Outcome: Progressing   Problem: Coping: Goal: Level of anxiety will decrease Outcome: Progressing   Problem: Elimination: Goal: Will not experience complications related to urinary retention Outcome: Progressing   Problem: Pain Managment: Goal: General experience of comfort will improve Outcome: Progressing   Problem:  Safety: Goal: Ability to remain free from injury will improve Outcome: Progressing   Problem: Skin Integrity: Goal: Risk for impaired skin integrity will decrease Outcome: Progressing   Problem: Education: Goal: Knowledge of disease or condition will improve Outcome: Progressing Goal: Knowledge of secondary prevention will improve (MUST DOCUMENT ALL) Outcome: Progressing Goal: Knowledge of patient specific risk factors will improve Loraine Leriche N/A or DELETE if not current risk factor) Outcome: Progressing   Problem: Ischemic Stroke/TIA Tissue Perfusion: Goal: Complications of ischemic stroke/TIA will be minimized Outcome: Progressing   Problem: Coping: Goal: Will verbalize positive feelings about self Outcome: Progressing Goal: Will identify appropriate support needs Outcome: Progressing   Problem: Health Behavior/Discharge Planning: Goal: Ability to manage health-related needs will improve Outcome: Progressing Goal: Goals will be collaboratively established with patient/family Outcome: Progressing   Problem: Self-Care: Goal: Ability to participate in self-care as condition permits will improve Outcome: Progressing Goal: Verbalization of feelings and concerns over difficulty with self-care will improve Outcome: Progressing Goal: Ability to communicate needs accurately will improve Outcome: Progressing   Problem: Nutrition: Goal: Risk of aspiration will decrease Outcome: Progressing Goal: Dietary intake will improve Outcome: Progressing

## 2022-09-26 NOTE — TOC Progression Note (Signed)
Transition of Care [facility]) - Progression Note    Patient Details  Name: Charles Holmes MRN: [id number] Date of Birth: 02/08/1965  Transition of Care [facility]) CM/SW Contact  Leone Haven, RN Phone Number: 09/26/2022, 11:55 AM  Clinical Narrative:    Patient discussed in progression rounds,  conts with left side weakness, he is talking today, he was feeding himself per staff RN.  Per Lillia Abed PA this patient is going to cir at discharge. after cir he will go home (not sure how long he will need cir). will need to let pam chandler with [facility] know about him. needs 6 weeks of IV antibiotics.  This NCM informed Jeri Modena of this information above.     Expected Discharge Plan: IP Rehab Facility Barriers to Discharge: Continued Medical Work up  Expected Discharge Plan and Services   Discharge Planning Services: CM Consult   Living arrangements for the past 2 months: Single Family Home                                       Social Determinants of Health (SDOH) Interventions SDOH Screenings   Food Insecurity: No Food Insecurity (08/13/2022)  Housing: Low Risk  (08/13/2022)  Transportation Needs: No Transportation Needs (08/13/2022)  Utilities: Not At Risk (08/13/2022)  Financial Resource Strain: Low Risk  (07/12/2022)  Tobacco Use: Medium Risk (08/22/2022)    Readmission Risk Interventions     No data to display

## 2022-09-26 NOTE — Progress Notes (Addendum)
Patient ID: Charles Holmes, male   DOB: 04-03-1965, 57 y.o.   MRN: [id number]     Advanced Heart Failure Rounding Note  PCP-Cardiologist: Norman Herrlich, MD  [facility]: Dr. Gala Romney   Subjective:    7/25: Admit with cardiogenic shock. Started milrinone and NE. 8/9 IABP placed 8/13 S/p HM III LVAD implant + clipping left atrial appendage d/t severe thickening and invagination of mitral valve annulus impeding flows. 8/14 Left-sided hemiplegia. CT head with acute R MCA infarct. IR for thrombectomy. CT head with small to moderate area of hemorrhagic conversion 8/17 Low dose heparin gtt restarted, CT head with decreased size of hemorrhagic stroke.  8/19: Limited echo: Mod RV dysfunction, better mitral inflow into the LV and VAD position satisfactory. 8/26: Echo showed moderate-severe RV dysfunction but RV not enlarged.  Interventricular septum does bow towards towards the right. Speed increased to 5400 rpm, flow rose to 4.9 L/min.  Interventricular septum remained stable bowing slightly towards the right.    8/27: RHC with RA mean 5, PA 30/7, mean PCWP 4, CI 3.49, PAPi 4.6 9/4: Ramp Echo>>Fixed speed 5400  9/6: fever. Bcx with enterococcus facelis 2/2  Remains on ampicillin + ceftriaxone. Repeat BC X 2 09/08 NGTD. AF. No leukocytosis.  MAP 80s  ? Weight trend  In better spirits after seeing his granddaughter yesterday. Walked 120 feet with PT yesterday.    LVAD Interrogation HM III: Speed: 5400 Flow: 4.4  PI: 3.7 Power: 4. 2 PI events so fart this am. VAD interrogated personally. Parameters stable.  Objective:   Weight Range: 81.9 kg Body mass index is 28.28 kg/m.   Vital Signs:   Temp:  [97.5 F (36.4 C)-98.5 F (36.9 C)] 98 F (36.7 C) (09/10 0901) Pulse Rate:  [68-87] 79 (09/10 0901) Resp:  [14-19] 19 (09/10 0901) BP: (79-109)/(69-87) 108/87 (09/10 0901) SpO2:  [91 %-94 %] 91 % (09/10 0901) FiO2 (%):  [93 %] 93 % (09/10 0747) Weight:  [81.9 kg] 81.9 kg (09/10 0433) Last BM  Date : 09/25/22  Weight change: Filed Weights   09/24/22 0337 09/25/22 0500 09/26/22 0433  Weight: 79.8 kg 80.1 kg 81.9 kg   Intake/Output:   Intake/Output Summary (Last 24 hours) at 09/26/2022 0935 Last data filed at 09/26/2022 0701 Gross per 24 hour  Intake 1340.05 ml  Output 1251 ml  Net 89.05 ml      Physical Exam   Physical Exam: GENERAL: No distress. Lying in bed. HEENT: normal  NECK: Supple, JVP to midneck.  2+ bilaterally, no bruits.   CARDIAC:  Mechanical heart sounds with LVAD hum present.  LUNGS:  Clear to auscultation bilaterally.  ABDOMEN:  Soft, round, nontender, positive bowel sounds x4.     LVAD exit site: Dressing dry and intact.  No erythema or drainage.  Stabilization device present and accurately applied.   EXTREMITIES:  Warm and dry, no cyanosis, clubbing, rash or edema  NEUROLOGIC:  Alert and oriented x 4.  Left-sided weakness.    Telemetry    V paced 70s-80s, PVCs  Labs    CBC Recent Labs    09/25/22 0220 09/26/22 0223  WBC 9.5 9.1  HGB 9.5* 9.2*  HCT 32.0* 30.5*  MCV 93.8 92.1  PLT 286 283    Basic Metabolic Panel Recent Labs    40/98/11 0220 09/26/22 0223  NA 139 137  K 4.3 3.7  CL 101 99  CO2 29 27  GLUCOSE 77 77  BUN 12 12  CREATININE 0.65 0.67  CALCIUM  8.7* 8.5*  MG 2.0 1.8    BNP: BNP (last 3 results) Recent Labs    09/11/22 2252 09/18/22 2246 09/25/22 2351  BNP 933.0* 809.0* 851.4*     Imaging    ECHOCARDIOGRAM COMPLETE  Result Date: 09/25/2022    ECHOCARDIOGRAM REPORT   Patient Name:   Charles Holmes Date of Exam: 09/25/2022 Medical Rec #:  [id number]       Height:       67.0 in Accession #:    [id number]      Weight:       176.6 lb Date of Birth:  1965/10/12       BSA:          1.918 m Patient Age:    57 years        BP:           87/76 mmHg Patient Gender: M               HR:           77 bpm. Exam Location:  Inpatient Procedure: 2D Echo, Color Doppler and Cardiac Doppler Indications:    Bacteremia R78.81   History:        Patient has prior history of Echocardiogram examinations, most                 recent 09/20/2022. CHF, CAD; Risk Factors:Hypertension and                 Dyslipidemia.  Sonographer:    Harriette Bouillon RDCS Referring Phys: [id number] BRITTAINY M SIMMONS IMPRESSIONS  1. Poor acoustic windows. Left ventricular ejection fraction, by estimation, is <20%. The left ventricle has severely decreased function. The left ventricle demonstrates global hypokinesis. The left ventricular internal cavity size was severely dilated.  Indeterminate diastolic filling due to E-A fusion.  2. Right ventricular systolic function is severely reduced. The right ventricular size is normal.  3. The mitral valve was not well visualized. No evidence of mitral valve regurgitation.  4. Aortic valve regurgitation is not visualized. Conclusion(s)/Recommendation(s): No evidence of valvular vegetations on this transthoracic echocardiogram. Consider a transesophageal echocardiogram to exclude infective endocarditis if clinically indicated. FINDINGS  Left Ventricle: Poor acoustic windows. Left ventricular ejection fraction, by estimation, is <20%. The left ventricle has severely decreased function. The left ventricle demonstrates global hypokinesis. The left ventricular internal cavity size was severely dilated. There is no left ventricular hypertrophy. Indeterminate diastolic filling due to E-A fusion. Right Ventricle: The right ventricular size is normal. Right ventricular systolic function is severely reduced. Pericardium: There is no evidence of pericardial effusion. Mitral Valve: The mitral valve was not well visualized. No evidence of mitral valve regurgitation. Tricuspid Valve: Tricuspid valve regurgitation is mild. Aortic Valve: Aortic valve regurgitation is not visualized. Pulmonic Valve: The pulmonic valve was not well visualized. Aorta: The aortic root and ascending aorta are structurally normal, with no evidence of dilitation.  Additional Comments: A device lead is visualized.  LEFT VENTRICLE PLAX 2D LVIDd:         5.90 cm LVIDs:         5.80 cm LV PW:         0.60 cm LV IVS:        0.60 cm LVOT diam:     2.30 cm LVOT Area:     4.15 cm  RIGHT VENTRICLE RV S prime:     5.77 cm/s TAPSE (M-mode): 0.8 cm LEFT ATRIUM  Index LA diam:    4.40 cm 2.29 cm/m   AORTA Ao Root diam: 3.30 cm Ao Asc diam:  3.20 cm MITRAL VALVE               TRICUSPID VALVE MV Area (PHT): 5.50 cm    TR Peak grad:   24.0 mmHg MV E velocity: 49.30 cm/s  TR Vmax:        245.00 cm/s MV A velocity: 62.10 cm/s MV E/A ratio:  0.79        SHUNTS                            Systemic Diam: 2.30 cm Carolan Clines Electronically signed by Carolan Clines Signature Date/Time: 09/25/2022/5:40:37 PM    Final      Medications:     Scheduled Medications:  sodium chloride   Intravenous Once   aspirin EC  81 mg Oral Daily   atorvastatin  80 mg Oral Daily   Chlorhexidine Gluconate Cloth  6 each Topical Q0600   digoxin  0.125 mg Oral Daily   dorzolamide-timolol  1 drop Both Eyes BID   dronabinol  2.5 mg Oral BID AC   feeding supplement  237 mL Oral TID BM   Gerhardt's butt cream   Topical TID   insulin aspart  0-24 Units Subcutaneous TID WC & HS   latanoprost  1 drop Both Eyes QHS   melatonin  3 mg Oral QHS   mexiletine  250 mg Oral BID   midodrine  10 mg Oral Q8H   mirtazapine  7.5 mg Oral QHS   mometasone-formoterol  2 puff Inhalation BID   multivitamin with minerals  1 tablet Oral Daily   mouth rinse  15 mL Mouth Rinse 4 times per day   pantoprazole  40 mg Oral Daily   potassium chloride  40 mEq Oral Once   sertraline  50 mg Oral Daily   sildenafil  20 mg Oral TID   sodium chloride flush  3 mL Intravenous Q12H   umeclidinium bromide  1 puff Inhalation Daily   warfarin  0.5 mg Oral ONCE-1600   Warfarin - Pharmacist Dosing Inpatient   Does not apply q1600    Infusions:  sodium chloride Stopped (09/13/22 2035)   ampicillin (OMNIPEN) IV 300 mL/hr at  09/26/22 0701   cefTRIAXone (ROCEPHIN)  IV Stopped (09/25/22 2253)   magnesium sulfate bolus IVPB      PRN Medications: sodium chloride, acetaminophen, albuterol, dextrose, ondansetron (ZOFRAN) IV, mouth rinse, oxyCODONE, polyethylene glycol, sodium chloride, sodium chloride flush, traMADol   Patient Profile  Charles Holmes is a 57 y.o. 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 Admitted with cardiogenic shock, stabilized and underwent HM3 LVAD. Post implant course c/b acute CVA and enterococcus faecalis bacteremia.   Assessment/Plan  1.  Acute on chronic Systolic HF-->Cardiogenic Shock  - Diagnosed 11/2019. Presented with VT. LHC 70% LAD  - cMRI 12/21 concerning for sarcoid and EF 18%.  - PET 2/22 at [facility] EF 25% + active sarcoid - Echo 08/26/20 EF < 20% severely dilated LV RV mildly decreased.  - [employer] CRT-D upgrade in 06/08/21 - Echo 07/10/22: EF <20%, RV okay, mod pericardial effusion, mod Charles/TR - Admitted 07/25 with cardiogenic shock. - RHC: Nonobstructive CAD, severely elevated filling pressures and low Fick CO/CI (2.7/1.4) - 08/13 HM III LVAD implant + clipping LAA d/t  severe thickening and invagination of mitral valve annulus impeding flows.  - Apical core sent - no mention of sarcoid - Speed increased to 5300 on 08/14.  - Echo 8/26 mod-sev RV dysfunction. Speed increased to 5400 8/26 - RHC 8/27: low filing pressures with excellent cardiac output on EPI, PAPi 4.6 - Ramp Echo 9/4, speed increased to 5400  - MAPs 80s. Will wean midodrine to 5 BID. - Continues with mild volume overload. Continue lasix 40 mg po daily.  - Continue digoxin 0.125 mg daily - Eventual CIR. Hopefully this week   2. HM-3 LVAD - VAD interrogated personally. Parameters stable. - LDH stable - INR 2.6. ASA 81 mg daily. Discussed Warfarin dosing with PharmD personally.  3.  Acute stroke - Hx CVA 06/24 -Admitted 06/24 w/ R MCA stroke. S/p  TPA and mechanical clot extraction. No residual deficits. Likely cardioembolic in setting of severe LV dysfunction. - Developed left sided weakness 08/14. CTA with R MCA infarct. Taken to IR for thrombectomy - Repeat CT head with small to moderate size hemorrhagic conversion.  - repeat head CT on 8/17 w/ improved hemorrhagic CVA - Back on warfarin. INR 2.6. - neurology signed off 09/03/22.  - Continue PT/OT - Continue ASA 81 mg daily - Plan CIR   4. Enterococcus faecalis bacteremia - Bcx 2/2 on 9/6 - ID consult 9/6 -> ampicillin and ceftriaxone - TEE recommended but doubt it will change management as VAD cannot be removed and will require IV abx followed by long-term suppression - Echo 09/10 - no obvious vegetations - PICC out - Repeat BCx 9/8 NGTD. - ID recommending 6 weeks IV amp/ceftriaxone followed by long-term suppression with amoxicillin  5. Hx VT - ln setting of potential sarcoid heart disease  - Off amio due to tremor. Continue mexiletine  - now s/p ICD.   6. CAD - LHC 12/07/19 70-% LAD, no intervention - LHC 8/24 non obstructive CAD.  - Continue statin. On aspirin for VAD. - No s/s angina  7. 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  - holding methotrexate w/ recent surgery, timing to restart to be discussed with MD - apical core pathology not diagnostic of cardiac sarcoidosis.   8. 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 AKI - suspect cardiorenal, improved w/ inotropic support - Creatinine stable now   10. Iron deficiency anemia/ Post-op anemia - recent T sat 15%, scheduled for OP feraheme. Will complete inpatient  - Transfused 1 u RBCs 8/15 - Hgb at 9.2   11. Pulmonary  - PFTs with severe obstructive defect, response to bronchodilator. FEV1 1.04L, FEV1/FVC 48% - extubated 8/15  - Stable  12. Depression - Improving - Continue remeron and sertraline   Plan for CIR at discharge.   Will ask HF  TOC CSW to assist with arranging IV antibiotics at discharge.   FINCH, LINDSAY N, PA-C  9:35 AM  Patient seen with PA/NP, agree with the above note.   Subjective: - Doing well today; ambulating the hallway.    Exam: General: NAD HEENT: Normal.  Neck: Thick neck. No JVD, no thyromegaly or thyroid nodule.  Lungs: Clear to auscultation bilaterally with normal respiratory effort. CV: normal LVAD hum Abdomen: driveline site w/o erythema.  Skin: Intact without lesions or rashes.  Neurologic: awake/alert, no gross FND.  Psych: Normal affect. Extremities: No clubbing or cyanosis.   LVAD Interrogation HM III: Speed: 5400 Flow: 4.4  PI: 3.7  Power: 4. 2 PI events so far this am. VAD interrogated personally. Parameters stable.  A/P - continue to push aggressive PT; hopeful for transfer to CIR in the next 1-2 days. From an LVAD/HFrEF standpoint he is doing very well. Euvolemic on exam today. sCr is stable and wnl. Hemodynamically stable. Continue IV abx.    Sorina Derrig Advanced Heart Failure

## 2022-09-26 NOTE — Progress Notes (Signed)
ANTICOAGULATION CONSULT NOTE  Pharmacy Consult for heparin >warfarin Indication:  LVAD  Allergies  Allergen Reactions   Amiodarone     Severe tremors   Percocet [Oxycodone-Acetaminophen] Itching    Patient Measurements: Height: 5\' 7"  (170.2 cm) Weight: 81.9 kg (180 lb 8.9 oz) IBW/kg (Calculated) : 66.1 Heparin Dosing Weight: 87kg  Vital Signs: Temp: 97.5 F (36.4 C) (09/10 0228) Temp Source: Axillary (09/10 0228) BP: 100/75 (09/10 0228) Pulse Rate: 68 (09/10 0228)  Labs: Recent Labs    09/24/22 0538 09/24/22 0841 09/24/22 0841 09/25/22 0220 09/26/22 0223  HGB  --  8.7*   < > 9.5* 9.2*  HCT  --  28.6*  --  32.0* 30.5*  PLT  --  283  --  286 283  LABPROT 24.4*  --   --  25.9* 28.1*  INR 2.2*  --   --  2.3* 2.6*  CREATININE 0.70  --   --  0.65 0.67   < > = values in this interval not displayed.    Estimated Creatinine Clearance: 101.8 mL/min (by C-G formula based on SCr of 0.67 mg/dL).   Medical History: Past Medical History:  Diagnosis Date   CAD (coronary artery disease)    CHF (congestive heart failure) (HCC)    GERD (gastroesophageal reflux disease)    Hyperlipidemia    Hypertension    Systolic heart failure (HCC) 2021   LVEF 18%, RVEF 38% on cardiac MRI 12/19/2019. possible cardiac sarcoidosis.   Wide-complex tachycardia 2021   wears LifeVest     Assessment: 57yoM on apixaban PTA for hx AF admitted for LVAD workup. Pt s/p HM3 implant on 8/13 c/b acute CVA postop. Head CT 8/17 stable, ok to start low dose heparin per neuro, warfarin started 8/19.  INR came back supratherapeutic at 2.6 today. Hgb stable at 9.2, plt 283. LDH stable at 251. No s/sx of bleeding. Oral intake is still low, 1 ensures consumed yesterday.   Goal of Therapy:  INR 2-3 Monitor platelets by anticoagulation protocol: Yes   Plan:  Warfarin 0.5 mg tonight  Continue marinol and remeron for now  Daily INR, CBC  Thank you for allowing pharmacy to participate in this patient's  care,  Sherron Monday, PharmD, BCCCP Clinical Pharmacist  Phone: [phone] 09/26/2022 7:35 AM  Please check AMION for all [facility] Pharmacy phone numbers After 10:00 PM, call Main Pharmacy [phone]

## 2022-09-26 NOTE — Progress Notes (Signed)
PHARMACY CONSULT NOTE FOR:  OUTPATIENT  PARENTERAL ANTIBIOTIC THERAPY (OPAT)  Indication: E faecalis bacteremia in the setting of an LVAD  Regimen: Ampicillin 12 g every 24 hours as a continuous infusion + Ceftriaxone 2 gm IV Q 12 hours  End date: 11/04/22  IV antibiotic discharge orders are pended. To discharging provider:  please sign these orders via discharge navigator,  Select New Orders & click on the button choice - Manage This Unsigned Work.     Thank you for allowing pharmacy to be a part of this patient's care.  Sharin Mons, PharmD, BCPS, BCIDP Infectious Diseases Clinical Pharmacist Phone: [phone] 09/26/2022, 1:32 PM

## 2022-09-26 NOTE — Progress Notes (Signed)
Patient ID: Charles Holmes, male   DOB: [birth year], 57 y.o.   MRN: [id number]    Progress Note from the Palliative Medicine Team at [facility]   Patient Name: Charles Holmes        Date: 09/26/2022 DOB: [birth year]  Age: 57 y.o. MRN#: [id number] Attending Physician: Charles Patty, MD Primary Care Physician: Charles Peak, PA-C Admit Date: 08/10/2022   Extensive chart review has been completed prior to meeting with patient  including labs, vital signs, imaging, progress/consult notes, orders, medications and available advance directive documents.   57 y.o. 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  Full assessment and workup; status post LVAD implant on 08-29-2022.  Postop day 1 code stroke called, CT significant for acute right MCA infarct, status post thrombectomy. 8/14 Left-sided hemiplegia. CT head with acute R MCA infarct. IR for thrombectomy. CT head with small to moderate area of hemorrhagic conversion 8/17 Low dose heparin gtt restarted, CT head with decreased size of hemorrhagic stroke.  8/19: Limited echo: Mod RV dysfunction, better mitral inflow into the LV and VAD position satisfactory. 8/26: Echo showed moderate-severe RV dysfunction but RV not enlarged.  Interventricular septum does bow towards towards the right. Speed increased to 5400 rpm, flow rose to 4.9 L/min.  Interventricular septum remained stable bowing slightly towards the right.    8/27: RHC with RA mean 5, PA 30/7, mean PCWP 4, CI 3.49, PAPi 4.6 9/4: Ramp Echo>>Fixed speed 5400  9/6: fever. Bcx with enterococcus facelis 2/2  Patient continues to make slow progress with therapies working to his goal of inpatient CIR rehabilitation   This NP assessed patient at the bedside as a follow up for palliative medicine needs and emotional support.  Patient was sleeping when I am entered the room, he arouses easily to verbal stimulation.  Yesterday was a good day for Mr.  Holmes having the chance to be with his new granddaughter.    Continued education regarding the importance of continued hard work with physical therapy, again the goal is CIR   Education offered on the role positive attitude plays in overall health and wellness.    I stressed the importance of self motivation and trying to develop a new normal daily.  Encouraged him to request to get out of bed at least on a daily basis.   Emotional support and encouragement for his ongoing progress.     PMT will continue to support patient and family holistically.  Education offered today regarding  the importance of continued conversation within  family/support system and the medical providers regarding overall plan of care and treatment options,  ensuring decisions are within the context of the patients values and GOCs.  Questions and concerns addressed     Time: 35 minutes  Charles Creed NP  Palliative Medicine Team Team Phone # [phone] Pager [phone]

## 2022-09-26 NOTE — Discharge Summary (Incomplete)
Advanced Heart Failure Team  Discharge Summary   Patient ID: Charles Holmes MRN: [id number], DOB/AGE: 05-01-1965 57 y.o. Admit date: 08/10/2022 D/C date:     09/27/2022   Primary Discharge Diagnoses:  Acute on Chronic Systolic Heart Failure w/ Cardiogenic Shock Requiring Inotropic + MCS S/p HM3 LVAD + LAA Clip (08/25/22)  Acute R MCA infarct, s/p IR guided thrombectomy, small area of hemorrhagic conversion Enterococcus faecalis bacteremia Multiple Dental Carries, s/p Tooth Extractions  Deconditioning    Pertinent PMH  Charles Holmes is a 57 y.o. 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    Admitted to [facility] 11/21 with CP. Found to have VT and EF 25-30% mild AI, Charles and TR. Transferred to [facility]. Cath showed 70% prox LAD with no intervention.  He was discharged with Life Vest.    Presented to HF Clinic 02/03/19 with recurrent VT. Amio started. Referred to [facility] for PET. PET @ [facility] 2/22 showed EF 25% with active inflammation c/w sarcoid.   Re-admitted  02/2020 with recurrent VT and LifeVest alarms. + AKI. Started prednisone and PJP prophylaxis. ICD implanted. Amio switched due to mexilitene due to severe tremor.  CPX 8/22. Unable to complete PFTs due to hypoventilation and poor effort. pVO2 16.1 (49% predicted). Slope 19 RER: 1.17 PETCO2 59    S/P CRT-D upgrade 06/08/21.  6/24, admitted w/ acute CVA due to right M1 occlusion treated w/ mechanical thrombectomy. Post procedure developed hypotension. Given IVF w/ improvement of BP but developed subsequent volume overload. Echo showed LV markedly dilated EF < 20% and small effusion. RV ok. CVA felt to be cardioembolic. AHF team was consulted for HF management. Underwent RHC 6/28 which showed mean RA 9, PA 58/26, mean PCWP 30, CI 2.6. Diuresed w/ IV Lasix. Placed on GDMT, though limited some by soft BP. Anticoagulated w/ Eliquis. Plan was for VAD/transplant workup in the near future. He was discharged home on  07/16/22. Plan was to repeat CPX several months after recovering from acute stroke (anticipated 9/24).   Hospital Course:   Pt presented to the ED on 7/25  with marked volume overload and class IV HF w/ low output. Taken same day to cath lab for [facility]. Angiography showed mild nonobstructive CAD. RHC hemodynamics c/w severe NICM w/ severely elevated filling pressures and low output state c/w cardiogenic shock (RA 19, PA 80/24, PCW 47, CI 1.4, PVR 3.0 WU, PAPi 2.0). Transferred to CCU and started on milrinone and NE support. Diuresed w/ IV lasix. CT surgery consulted to consider LVAD candidacy (PFTs had been a barrier in the past). Underwent VAD w/u. Repeat PFTs w/ severe obstructive defect (FEV1 1.04L, FEV1/FVC 48%) +response to bronchodilator. Pulmonology consulted to assist w/ pre-VAD optimization. Oral surgery consulted for dental carries and he underwent tooth extractions. Despite milrinone support, he c/w persistent decompensated HF. Was taken back to cath lab on 8/9. Cath while on milrinone 0.375 showed CI 1.9, PAPi 3.7 IABP placed for MCS. Underwent LVAD implant on 08/13 + clipping left atrial appendage d/t severe thickening and invagination of mitral valve annulus impeding flows. Apical core sent to pathology - no mention of sarcoid.  Post VAD implant c/b left-sided hemiplegia. CT head 8/14 with acute R MCA infarct. Taken to IR for thrombectomy. F/u head CT head with small to moderate area of hemorrhagic conversion. 8/17 Low dose heparin gtt restarted>>repeat CT head with decreased size of hemorrhagic stroke. Had some residual deficits w/ mild left sided weakness  but otherwise recovered.   Course further c/b enterococcus faecalis bacteremia w/ 2/2 + cultures on 9/6. ID consulted. Recommended ampicillin and ceftriaxone. PICC removed. Repeat BC X 2 09/08 NGTD. Will need 6 wks total therapy. EOD 11/04/22.   PT/OT recommended CIR for deconditioning. On 9/11, he was last seen and examined by Dr. Gasper Holmes  and felt medically stable for discharge to CIR.    Discharge Weight Range: 175 lb Discharge Vitals: Blood pressure 129/62, pulse (!) 153, temperature 98 F (36.7 C), temperature source Oral, resp. rate 19, height 5\' 7"  (1.702 m), weight 79.4 kg, SpO2 93%.  Labs: Lab Results  Component Value Date   WBC 9.7 09/27/2022   HGB 9.5 (L) 09/27/2022   HCT 32.6 (L) 09/27/2022   MCV 94.2 09/27/2022   PLT 307 09/27/2022    Recent Labs  Lab 09/27/22 0226  NA 139  K 4.1  CL 102  CO2 25  BUN 12  CREATININE 0.70  CALCIUM 8.5*  GLUCOSE 113*   Lab Results  Component Value Date   CHOL 90 08/11/2022   HDL 26 (L) 08/11/2022   LDLCALC 51 08/11/2022   TRIG 64 08/11/2022   BNP (last 3 results) Recent Labs    09/11/22 2252 09/18/22 2246 09/25/22 2351  BNP 933.0* 809.0* 851.4*    ProBNP (last 3 results) No results for input(s): "PROBNP" in the last 8760 hours.   Diagnostic Studies/Procedures   ECHOCARDIOGRAM COMPLETE  Result Date: 09/25/2022    ECHOCARDIOGRAM REPORT   Patient Name:   Charles Holmes Date of Exam: 09/25/2022 Medical Rec #:  [id number]       Height:       67.0 in Accession #:    [id number]      Weight:       176.6 lb Date of Birth:  04-Mar-1965       BSA:          1.918 m Patient Age:    57 years        BP:           87/76 mmHg Patient Gender: M               HR:           77 bpm. Exam Location:  Inpatient Procedure: 2D Echo, Color Doppler and Cardiac Doppler Indications:    Bacteremia R78.81  History:        Patient has prior history of Echocardiogram examinations, most                 recent 09/20/2022. CHF, CAD; Risk Factors:Hypertension and                 Dyslipidemia.  Sonographer:    Charles Holmes RDCS Referring Phys: [id number] Charles Holmes IMPRESSIONS  1. Poor acoustic windows. Left ventricular ejection fraction, by estimation, is <20%. The left ventricle has severely decreased function. The left ventricle demonstrates global hypokinesis. The left ventricular internal  cavity size was severely dilated.  Indeterminate diastolic filling due to E-A fusion.  2. Right ventricular systolic function is severely reduced. The right ventricular size is normal.  3. The mitral valve was not well visualized. No evidence of mitral valve regurgitation.  4. Aortic valve regurgitation is not visualized. Conclusion(s)/Recommendation(s): No evidence of valvular vegetations on this transthoracic echocardiogram. Consider a transesophageal echocardiogram to exclude infective endocarditis if clinically indicated. FINDINGS  Left Ventricle: Poor acoustic windows. Left ventricular ejection fraction, by estimation, is <20%. The left ventricle  has severely decreased function. The left ventricle demonstrates global hypokinesis. The left ventricular internal cavity size was severely dilated. There is no left ventricular hypertrophy. Indeterminate diastolic filling due to E-A fusion. Right Ventricle: The right ventricular size is normal. Right ventricular systolic function is severely reduced. Pericardium: There is no evidence of pericardial effusion. Mitral Valve: The mitral valve was not well visualized. No evidence of mitral valve regurgitation. Tricuspid Valve: Tricuspid valve regurgitation is mild. Aortic Valve: Aortic valve regurgitation is not visualized. Pulmonic Valve: The pulmonic valve was not well visualized. Aorta: The aortic root and ascending aorta are structurally normal, with no evidence of dilitation. Additional Comments: A device lead is visualized.  LEFT VENTRICLE PLAX 2D LVIDd:         5.90 cm LVIDs:         5.80 cm LV PW:         0.60 cm LV IVS:        0.60 cm LVOT diam:     2.30 cm LVOT Area:     4.15 cm  RIGHT VENTRICLE RV S prime:     5.77 cm/s TAPSE (M-mode): 0.8 cm LEFT ATRIUM         Index LA diam:    4.40 cm 2.29 cm/m   AORTA Ao Root diam: 3.30 cm Ao Asc diam:  3.20 cm MITRAL VALVE               TRICUSPID VALVE MV Area (PHT): 5.50 cm    TR Peak grad:   24.0 mmHg MV E velocity:  49.30 cm/s  TR Vmax:        245.00 cm/s MV A velocity: 62.10 cm/s MV E/A ratio:  0.79        SHUNTS                            Systemic Diam: 2.30 cm Carolan Clines Electronically signed by Carolan Clines Signature Date/Time: 09/25/2022/5:40:37 PM    Final     Discharge Medications   Allergies as of 09/27/2022       Reactions   Amiodarone    Severe tremors   Percocet [oxycodone-acetaminophen] Itching        Medication List     STOP taking these medications    apixaban 5 MG Tabs tablet Commonly known as: ELIQUIS   carvedilol 3.125 MG tablet Commonly known as: COREG   dapagliflozin propanediol 10 MG Tabs tablet Commonly known as: Farxiga   methotrexate 2.5 MG tablet Commonly known as: RHEUMATREX   spironolactone 25 MG tablet Commonly known as: ALDACTONE       TAKE these medications    Advair HFA 230-21 MCG/ACT inhaler Generic drug: fluticasone-salmeterol Inhale 2 puffs into the lungs 2 (two) times daily.   albuterol (2.5 MG/3ML) 0.083% nebulizer solution Commonly known as: PROVENTIL Take 3 mLs (2.5 mg total) by nebulization every 4 (four) hours as needed for wheezing or shortness of breath.   ampicillin IVPB Inject 12 g into the vein daily. As a continuous infusion. Indication:  Enterococcus bacteremia in the setting of an LVAD  First Dose: Yes Last Day of Therapy:  11/04/22 Labs - Once weekly:  CBC/D and BMP, Labs - Once weekly: ESR and CRP Method of administration: Ambulatory Pump (Continuous Infusion) Method of administration may be changed at the discretion of home infusion pharmacist based upon assessment of the patient and/or caregiver's ability to self-administer the medication ordered.   aspirin EC  81 MG tablet Take 1 tablet (81 mg total) by mouth daily. Swallow whole. Start taking on: September 28, 2022   atorvastatin 80 MG tablet Commonly known as: LIPITOR Take 1 tablet (80 mg total) by mouth daily.   cefTRIAXone IVPB Commonly known as: ROCEPHIN Inject  2 g into the vein every 12 (twelve) hours. Indication:  Enterococcal bacteremia in the setting of an LVAD  First Dose: Yes Last Day of Therapy:  11/04/22  Labs - Once weekly:  CBC/D and BMP, Labs - Once weekly: ESR and CRP Method of administration: IV Push Method of administration may be changed at the discretion of home infusion pharmacist based upon assessment of the patient and/or caregiver's ability to self-administer the medication ordered.   digoxin 0.125 MG tablet Commonly known as: LANOXIN Take 1 tablet (0.125 mg total) by mouth daily. Start taking on: September 28, 2022   dorzolamide-timolol 2-0.5 % ophthalmic solution Commonly known as: COSOPT Place 1 drop into both eyes in the morning and at bedtime.   dronabinol 2.5 MG capsule Commonly known as: MARINOL Take 1 capsule (2.5 mg total) by mouth 2 (two) times daily before lunch and supper.   feeding supplement Liqd Take 237 mLs by mouth 3 (three) times daily between meals.   folic acid 1 MG tablet Commonly known as: FOLVITE TAKE 1 TABLET BY MOUTH EVERY DAY   furosemide 40 MG tablet Commonly known as: LASIX Take 1 tablet (40 mg total) by mouth daily as needed. What changed:  when to take this reasons to take this   latanoprost 0.005 % ophthalmic solution Commonly known as: XALATAN Place 1 drop into both eyes at bedtime.   melatonin 3 MG Tabs tablet Take 1 tablet (3 mg total) by mouth at bedtime.   mexiletine 250 MG capsule Commonly known as: MEXITIL Take 1 capsule (250 mg total) by mouth 2 (two) times daily.   midodrine 5 MG tablet Commonly known as: PROAMATINE Take 1 tablet (5 mg total) by mouth every 8 (eight) hours.   mirtazapine 15 MG disintegrating tablet Commonly known as: REMERON SOL-TAB Take 0.5 tablets (7.5 mg total) by mouth at bedtime.   multivitamin with minerals Tabs tablet Take 1 tablet by mouth daily. Start taking on: September 28, 2022   oxyCODONE 5 MG immediate release tablet Commonly  known as: Oxy IR/ROXICODONE Take 1 tablet (5 mg total) by mouth every 4 (four) hours as needed for severe pain or moderate pain.   pantoprazole 40 MG tablet Commonly known as: PROTONIX Take 40 mg by mouth daily before breakfast.   sertraline 50 MG tablet Commonly known as: ZOLOFT Take 1 tablet (50 mg total) by mouth daily. Start taking on: September 28, 2022   sildenafil 20 MG tablet Commonly known as: REVATIO Take 1 tablet (20 mg total) by mouth 3 (three) times daily.   traMADol 50 MG tablet Commonly known as: ULTRAM Take 1-2 tablets (50-100 mg total) by mouth every 4 (four) hours as needed for moderate pain.   umeclidinium bromide 62.5 MCG/ACT Aepb Commonly known as: INCRUSE ELLIPTA Inhale 1 puff into the lungs daily. Start taking on: September 28, 2022   warfarin 1 MG tablet Commonly known as: COUMADIN Take 0.5 tablets (0.5 mg total) by mouth daily at 4 PM.               Discharge Care Instructions  (From admission, onward)           Start     Ordered   09/27/22  0000  Change dressing on IV access line weekly and PRN  (Home infusion instructions - [facility] )        09/27/22 1357            Disposition   The patient will be discharged in stable condition to home. Discharge Instructions     [facility] pharmacist to adjust dose for Vancomycin, Aminoglycosides and other anti-infective therapies as requested by physician.   Complete by: As directed    [facility] to provide Cath Flo 2mg    Complete by: As directed    Administer for PICC line occlusion and as ordered by physician for other access device issues.   Ambulatory referral to Neurology   Complete by: As directed    Follow up with Dr. Pearlean Brownie at [facility] in 4-6 weeks. Too complicated for RN to follow. Thanks.   Anaphylaxis Kit: Provided to treat any anaphylactic reaction to the medication being provided to the patient if First Dose or when requested by physician    Complete by: As directed    Epinephrine 1mg /ml vial / amp: Administer 0.3mg  (0.39ml) subcutaneously once for moderate to severe anaphylaxis, nurse to call physician and pharmacy when reaction occurs and call 911 if needed for immediate care   Diphenhydramine 50mg /ml IV vial: Administer 25-50mg  IV/IM PRN for first dose reaction, rash, itching, mild reaction, nurse to call physician and pharmacy when reaction occurs   Sodium Chloride 0.9% NS IV: Administer if needed for hypovolemic blood pressure drop or as ordered by physician after call to physician with anaphylactic reaction   Change dressing on IV access line weekly and PRN   Complete by: As directed    Flush IV access with Sodium Chloride 0.9% and Heparin 10 units/ml or 100 units/ml   Complete by: As directed    Home infusion instructions - [facility]   Complete by: As directed    Instructions: Flush IV access with Sodium Chloride 0.9% and Heparin 10units/ml or 100units/ml   Change dressing on IV access line: Weekly and PRN   Instructions Cath Flo 2mg : Administer for PICC Line occlusion and as ordered by physician for other access device   [facility] pharmacist to adjust dose for: Vancomycin, Aminoglycosides and other anti-infective therapies as requested by physician   Method of administration may be changed at the discretion of home infusion pharmacist based upon assessment of the patient and/or caregiver's ability to self-administer the medication ordered   Complete by: As directed        Follow-up Information     Micki Riley, MD. Schedule an appointment as soon as possible for a visit in 1 month(s).   Specialties: Neurology, Radiology Why: stroke clinic Contact information: [address] [place] [address] [phone]                   Duration of Discharge Encounter: Greater than 35 minutes   Signed, Knute Neu  09/27/2022, 2:01 PM  - Agree with above.  Stable for D/C to CIR for rehabilitation. Euvolemic on exam, JVP 5-6; minimal LE edema.   Aditya Sabharwal 11:29 AM

## 2022-09-26 NOTE — Plan of Care (Signed)
  Problem: Education: Goal: Knowledge of General Education information will improve Description: Including pain rating scale, medication(s)/side effects and non-pharmacologic comfort measures Outcome: Progressing   Problem: Clinical Measurements: Goal: Ability to maintain clinical measurements within normal limits will improve Outcome: Progressing   Problem: Activity: Goal: Risk for activity intolerance will decrease Outcome: Progressing   Problem: Nutrition: Goal: Adequate nutrition will be maintained Outcome: Progressing   Problem: Pain Managment: Goal: General experience of comfort will improve Outcome: Progressing   Problem: Safety: Goal: Ability to remain free from injury will improve Outcome: Progressing   Problem: Safety: Goal: Ability to remain free from injury will improve Outcome: Progressing

## 2022-09-26 NOTE — Progress Notes (Signed)
14 Days Post-Op Procedure(s) (LRB): RIGHT HEART CATH (N/A) Subjective: Patient has no complaints other than his breakfast is late. He seems more motivated to get up and work with PT following a visit with his newborn granddaughter Isabelle Course. Maintaining a paced rhythm Transthoracic echo reviewed yesterday because of positive blood cultures.  There is no evidence of endocarditis.  There is improvement noted in RV function.  The functional inflow resistance through the mitral valve into the LV also appears to be improved.  Objective: Vital signs in last 24 hours: Temp:  [97.5 F (36.4 C)-98.5 F (36.9 C)] 98 F (36.7 C) (09/10 0901) Pulse Rate:  [68-91] 91 (09/10 1000) Cardiac Rhythm: Ventricular paced (09/10 0737) Resp:  [14-19] 19 (09/10 0901) BP: (79-109)/(69-87) 108/87 (09/10 0901) SpO2:  [91 %-94 %] 91 % (09/10 0901) FiO2 (%):  [93 %] 93 % (09/10 0747) Weight:  [81.9 kg] 81.9 kg (09/10 0433)  Hemodynamic parameters for last 24 hours:  Paced rhythm Afebrile  Intake/Output from previous day: 09/09 0701 - 09/10 0700 In: 320 [P.O.:120; IV Piggyback:200] Out: 1651 [Urine:1650; Stool:1] Intake/Output this shift: Total I/O In: 1340.1 [P.O.:120; IV Piggyback:1220.1] Out: -        Exam    General- alert and comfortable    Neck- no JVD, no cervical adenopathy palpable, no carotid bruit   Lungs- clear without rales, wheezes.  Sternal incision healing well.   Cor-normal VAD hum, regular heart rate   Abdomen- soft, non-tender   Extremities - warm, non-tender, minimal edema   Neuro- oriented, appropriate, no focal weakness   Lab Results: Recent Labs    09/25/22 0220 09/26/22 0223  WBC 9.5 9.1  HGB 9.5* 9.2*  HCT 32.0* 30.5*  PLT 286 283   BMET:  Recent Labs    09/25/22 0220 09/26/22 0223  NA 139 137  K 4.3 3.7  CL 101 99  CO2 29 27  GLUCOSE 77 77  BUN 12 12  CREATININE 0.65 0.67  CALCIUM 8.7* 8.5*    PT/INR:  Recent Labs    09/26/22 0223  LABPROT 28.1*  INR  2.6*   ABG    Component Value Date/Time   PHART 7.365 08/31/2022 1504   HCO3 33.2 (H) 09/12/2022 1108   HCO3 31.2 (H) 09/12/2022 1108   TCO2 35 (H) 09/12/2022 1108   TCO2 33 (H) 09/12/2022 1108   ACIDBASEDEF 4.0 (H) 08/31/2022 1504   O2SAT 66.9 09/16/2022 0539   CBG (last 3)  Recent Labs    09/25/22 1552 09/25/22 2213 09/26/22 0624  GLUCAP 106* 72 83    Assessment/Plan: S/P Procedure(s) (LRB): RIGHT HEART CATH (N/A) Patient is approaching the functional status needed for transfer to CIR.  VAD performance is satisfactory, surgical incisions are healing, and anticoagulation is therapeutic.   LOS: 47 days    Lovett Sox 09/26/2022

## 2022-09-27 ENCOUNTER — Encounter (HOSPITAL_COMMUNITY): Payer: Self-pay | Admitting: Physical Medicine & Rehabilitation

## 2022-09-27 ENCOUNTER — Inpatient Hospital Stay (HOSPITAL_COMMUNITY)
Admission: RE | Admit: 2022-09-27 | Discharge: 2022-10-17 | DRG: 056 | Disposition: A | Discharge status: Home Health | Payer: Medicare Other | Source: Intra-hospital | Attending: Physical Medicine & Rehabilitation | Admitting: Physical Medicine & Rehabilitation

## 2022-09-27 ENCOUNTER — Other Ambulatory Visit: Payer: Self-pay

## 2022-09-27 ENCOUNTER — Other Ambulatory Visit (HOSPITAL_COMMUNITY): Payer: Self-pay

## 2022-09-27 DIAGNOSIS — Z87891 Personal history of nicotine dependence: Secondary | ICD-10-CM

## 2022-09-27 DIAGNOSIS — E8809 Other disorders of plasma-protein metabolism, not elsewhere classified: Secondary | ICD-10-CM | POA: Diagnosis present

## 2022-09-27 DIAGNOSIS — I69322 Dysarthria following cerebral infarction: Secondary | ICD-10-CM

## 2022-09-27 DIAGNOSIS — E785 Hyperlipidemia, unspecified: Secondary | ICD-10-CM | POA: Diagnosis present

## 2022-09-27 DIAGNOSIS — I952 Hypotension due to drugs: Secondary | ICD-10-CM | POA: Diagnosis not present

## 2022-09-27 DIAGNOSIS — F05 Delirium due to known physiological condition: Secondary | ICD-10-CM | POA: Diagnosis present

## 2022-09-27 DIAGNOSIS — I5023 Acute on chronic systolic (congestive) heart failure: Secondary | ICD-10-CM | POA: Diagnosis not present

## 2022-09-27 DIAGNOSIS — R7881 Bacteremia: Secondary | ICD-10-CM | POA: Diagnosis present

## 2022-09-27 DIAGNOSIS — I2510 Atherosclerotic heart disease of native coronary artery without angina pectoris: Secondary | ICD-10-CM | POA: Diagnosis present

## 2022-09-27 DIAGNOSIS — D509 Iron deficiency anemia, unspecified: Secondary | ICD-10-CM | POA: Diagnosis present

## 2022-09-27 DIAGNOSIS — G4700 Insomnia, unspecified: Secondary | ICD-10-CM | POA: Diagnosis not present

## 2022-09-27 DIAGNOSIS — Z792 Long term (current) use of antibiotics: Secondary | ICD-10-CM

## 2022-09-27 DIAGNOSIS — Z885 Allergy status to narcotic agent status: Secondary | ICD-10-CM

## 2022-09-27 DIAGNOSIS — I428 Other cardiomyopathies: Secondary | ICD-10-CM | POA: Diagnosis present

## 2022-09-27 DIAGNOSIS — I63511 Cerebral infarction due to unspecified occlusion or stenosis of right middle cerebral artery: Secondary | ICD-10-CM | POA: Diagnosis not present

## 2022-09-27 DIAGNOSIS — D62 Acute posthemorrhagic anemia: Secondary | ICD-10-CM | POA: Diagnosis not present

## 2022-09-27 DIAGNOSIS — Z23 Encounter for immunization: Secondary | ICD-10-CM | POA: Diagnosis not present

## 2022-09-27 DIAGNOSIS — Z7951 Long term (current) use of inhaled steroids: Secondary | ICD-10-CM

## 2022-09-27 DIAGNOSIS — I959 Hypotension, unspecified: Secondary | ICD-10-CM | POA: Diagnosis not present

## 2022-09-27 DIAGNOSIS — Z95811 Presence of heart assist device: Secondary | ICD-10-CM | POA: Diagnosis not present

## 2022-09-27 DIAGNOSIS — G4733 Obstructive sleep apnea (adult) (pediatric): Secondary | ICD-10-CM | POA: Diagnosis not present

## 2022-09-27 DIAGNOSIS — Z888 Allergy status to other drugs, medicaments and biological substances status: Secondary | ICD-10-CM

## 2022-09-27 DIAGNOSIS — I5084 End stage heart failure: Secondary | ICD-10-CM | POA: Diagnosis not present

## 2022-09-27 DIAGNOSIS — H409 Unspecified glaucoma: Secondary | ICD-10-CM | POA: Diagnosis present

## 2022-09-27 DIAGNOSIS — B952 Enterococcus as the cause of diseases classified elsewhere: Secondary | ICD-10-CM | POA: Diagnosis not present

## 2022-09-27 DIAGNOSIS — L22 Diaper dermatitis: Secondary | ICD-10-CM | POA: Diagnosis not present

## 2022-09-27 DIAGNOSIS — I110 Hypertensive heart disease with heart failure: Secondary | ICD-10-CM | POA: Diagnosis present

## 2022-09-27 DIAGNOSIS — N179 Acute kidney failure, unspecified: Secondary | ICD-10-CM | POA: Diagnosis not present

## 2022-09-27 DIAGNOSIS — Z79899 Other long term (current) drug therapy: Secondary | ICD-10-CM

## 2022-09-27 DIAGNOSIS — I480 Paroxysmal atrial fibrillation: Secondary | ICD-10-CM | POA: Diagnosis present

## 2022-09-27 DIAGNOSIS — E119 Type 2 diabetes mellitus without complications: Secondary | ICD-10-CM | POA: Diagnosis present

## 2022-09-27 DIAGNOSIS — I5022 Chronic systolic (congestive) heart failure: Secondary | ICD-10-CM | POA: Diagnosis not present

## 2022-09-27 DIAGNOSIS — K219 Gastro-esophageal reflux disease without esophagitis: Secondary | ICD-10-CM | POA: Diagnosis present

## 2022-09-27 DIAGNOSIS — G479 Sleep disorder, unspecified: Secondary | ICD-10-CM | POA: Diagnosis not present

## 2022-09-27 DIAGNOSIS — J449 Chronic obstructive pulmonary disease, unspecified: Secondary | ICD-10-CM | POA: Diagnosis present

## 2022-09-27 DIAGNOSIS — I69392 Facial weakness following cerebral infarction: Secondary | ICD-10-CM

## 2022-09-27 DIAGNOSIS — Z7901 Long term (current) use of anticoagulants: Secondary | ICD-10-CM

## 2022-09-27 DIAGNOSIS — Z7982 Long term (current) use of aspirin: Secondary | ICD-10-CM

## 2022-09-27 DIAGNOSIS — I69354 Hemiplegia and hemiparesis following cerebral infarction affecting left non-dominant side: Secondary | ICD-10-CM | POA: Diagnosis not present

## 2022-09-27 DIAGNOSIS — Z8249 Family history of ischemic heart disease and other diseases of the circulatory system: Secondary | ICD-10-CM

## 2022-09-27 DIAGNOSIS — F32A Depression, unspecified: Secondary | ICD-10-CM | POA: Diagnosis present

## 2022-09-27 DIAGNOSIS — I82712 Chronic embolism and thrombosis of superficial veins of left upper extremity: Secondary | ICD-10-CM | POA: Diagnosis not present

## 2022-09-27 DIAGNOSIS — B372 Candidiasis of skin and nail: Secondary | ICD-10-CM | POA: Diagnosis not present

## 2022-09-27 DIAGNOSIS — R609 Edema, unspecified: Secondary | ICD-10-CM | POA: Diagnosis not present

## 2022-09-27 DIAGNOSIS — Z95810 Presence of automatic (implantable) cardiac defibrillator: Secondary | ICD-10-CM

## 2022-09-27 HISTORY — DX: Presence of heart assist device: Z95.811

## 2022-09-27 HISTORY — DX: Cerebral infarction, unspecified: I63.9

## 2022-09-27 HISTORY — DX: Chronic obstructive pulmonary disease, unspecified: J44.9

## 2022-09-27 LAB — GLUCOSE, CAPILLARY
Glucose-Capillary: 85 mg/dL (ref 70–99)
Glucose-Capillary: 79 mg/dL (ref 70–99)

## 2022-09-27 LAB — CBC
HCT: 32.6 % — ABNORMAL LOW (ref 39.0–52.0)
Hemoglobin: 9.5 g/dL — ABNORMAL LOW (ref 13.0–17.0)
MCH: 27.5 pg (ref 26.0–34.0)
MCHC: 29.1 g/dL — ABNORMAL LOW (ref 30.0–36.0)
MCV: 94.2 fL (ref 80.0–100.0)
Platelets: 307 10*3/uL (ref 150–400)
RBC: 3.46 MIL/uL — ABNORMAL LOW (ref 4.22–5.81)
RDW: 18.1 % — ABNORMAL HIGH (ref 11.5–15.5)
WBC: 9.7 10*3/uL (ref 4.0–10.5)
nRBC: 0 % (ref 0.0–0.2)

## 2022-09-27 LAB — BASIC METABOLIC PANEL
Anion gap: 12 (ref 5–15)
BUN: 12 mg/dL (ref 6–20)
CO2: 25 mmol/L (ref 22–32)
Calcium: 8.5 mg/dL — ABNORMAL LOW (ref 8.9–10.3)
Chloride: 102 mmol/L (ref 98–111)
Creatinine, Ser: 0.7 mg/dL (ref 0.61–1.24)
GFR, Estimated: >60 mL/min (ref >60)
Glucose, Bld: 113 mg/dL — ABNORMAL HIGH (ref 70–99)
Potassium: 4.1 mmol/L (ref 3.5–5.1)
Sodium: 139 mmol/L (ref 135–145)

## 2022-09-27 LAB — PROTIME-INR
INR: 2.6 — ABNORMAL HIGH (ref 0.8–1.2)
Prothrombin Time: 28.4 s — ABNORMAL HIGH (ref 11.4–15.2)

## 2022-09-27 LAB — MAGNESIUM: Magnesium: 2 mg/dL (ref 1.7–2.4)

## 2022-09-27 LAB — LACTATE DEHYDROGENASE: LDH: 252 U/L — ABNORMAL HIGH (ref 98–192)

## 2022-09-27 MED ORDER — FUROSEMIDE 40 MG PO TABS: 40.0000 mg | ORAL_TABLET | Freq: Every day | ORAL | 3 refills | Status: DC | PRN

## 2022-09-27 MED ORDER — WARFARIN 0.5 MG HALF TABLET: 0.5000 mg | ORAL_TABLET | Freq: Once | ORAL | Status: DC

## 2022-09-27 MED ORDER — MIRTAZAPINE 15 MG PO TBDP
7.5000 mg | ORAL_TABLET | Freq: Every day | ORAL | Status: DC
  Administered 2022-09-27: 7.5 mg via ORAL
  Filled 2022-09-27: qty 0.5

## 2022-09-27 MED ORDER — TRAMADOL HCL 50 MG PO TABS: 50.0000 mg | ORAL_TABLET | ORAL | Status: DC | PRN

## 2022-09-27 MED ORDER — ENSURE ENLIVE PO LIQD: 237.0000 mL | Freq: Three times a day (TID) | ORAL | 12 refills | Status: DC

## 2022-09-27 MED ORDER — POLYETHYLENE GLYCOL 3350 17 G PO PACK: 17.0000 g | PACK | Freq: Every day | ORAL | Status: DC | PRN

## 2022-09-27 MED ORDER — FLEET ENEMA RE ENEM: 1.0000 | ENEMA | Freq: Once | RECTAL | Status: DC | PRN

## 2022-09-27 MED ORDER — SALINE SPRAY 0.65 % NA SOLN: 2.0000 | NASAL | Status: DC | PRN

## 2022-09-27 MED ORDER — ALBUTEROL SULFATE (2.5 MG/3ML) 0.083% IN NEBU: 2.5000 mg | INHALATION_SOLUTION | RESPIRATORY_TRACT | 12 refills | Status: DC | PRN

## 2022-09-27 MED ORDER — MIDODRINE HCL 5 MG PO TABS: 5.0000 mg | ORAL_TABLET | Freq: Three times a day (TID) | ORAL | Status: DC

## 2022-09-27 MED ORDER — DIGOXIN 125 MCG PO TABS
0.1250 mg | ORAL_TABLET | Freq: Every day | ORAL | Status: DC
  Administered 2022-09-28 – 2022-10-17 (×20): 0.125 mg via ORAL
  Filled 2022-09-27 (×20): qty 1

## 2022-09-27 MED ORDER — PANTOPRAZOLE SODIUM 40 MG PO TBEC
40.0000 mg | DELAYED_RELEASE_TABLET | Freq: Every day | ORAL | Status: DC
  Administered 2022-09-27 – 2022-10-16 (×20): 40 mg via ORAL
  Filled 2022-09-27 (×21): qty 1

## 2022-09-27 MED ORDER — UMECLIDINIUM BROMIDE 62.5 MCG/ACT IN AEPB: 1.0000 | INHALATION_SPRAY | Freq: Every day | RESPIRATORY_TRACT | Status: DC

## 2022-09-27 MED ORDER — DORZOLAMIDE HCL-TIMOLOL MAL 2-0.5 % OP SOLN
1.0000 [drp] | Freq: Two times a day (BID) | OPHTHALMIC | Status: DC
  Administered 2022-09-27 – 2022-10-17 (×37): 1 [drp] via OPHTHALMIC
  Filled 2022-09-27: qty 10

## 2022-09-27 MED ORDER — ALBUTEROL SULFATE (2.5 MG/3ML) 0.083% IN NEBU: 2.5000 mg | INHALATION_SOLUTION | RESPIRATORY_TRACT | Status: DC | PRN

## 2022-09-27 MED ORDER — WARFARIN 0.5 MG HALF TABLET
0.5000 mg | ORAL_TABLET | Freq: Once | ORAL | Status: AC
  Administered 2022-09-27: 0.5 mg via ORAL
  Filled 2022-09-27: qty 1

## 2022-09-27 MED ORDER — ONDANSETRON HCL 4 MG/2ML IJ SOLN: 4.0000 mg | Freq: Four times a day (QID) | INTRAMUSCULAR | Status: DC | PRN

## 2022-09-27 MED ORDER — INFLUENZA VIRUS VACC SPLIT PF (FLUZONE) 0.5 ML IM SUSY
0.5000 mL | PREFILLED_SYRINGE | INTRAMUSCULAR | Status: DC
  Filled 2022-09-27: qty 0.5

## 2022-09-27 MED ORDER — BISACODYL 5 MG PO TBEC: 5.0000 mg | DELAYED_RELEASE_TABLET | Freq: Every day | ORAL | Status: DC | PRN

## 2022-09-27 MED ORDER — SODIUM CHLORIDE 0.9 % IV SOLN
250.0000 mL | INTRAVENOUS | Status: DC | PRN
  Administered 2022-09-28 – 2022-10-01 (×3): 250 mL via INTRAVENOUS

## 2022-09-27 MED ORDER — ATORVASTATIN CALCIUM 80 MG PO TABS
80.0000 mg | ORAL_TABLET | Freq: Every day | ORAL | Status: DC
  Administered 2022-09-28 – 2022-10-17 (×20): 80 mg via ORAL
  Filled 2022-09-27 (×15): qty 1
  Filled 2022-09-27: qty 2
  Filled 2022-09-27 (×4): qty 1

## 2022-09-27 MED ORDER — WARFARIN 0.5 MG HALF TABLET
0.5000 mg | ORAL_TABLET | Freq: Once | ORAL | Status: DC
  Filled 2022-09-27: qty 1

## 2022-09-27 MED ORDER — ALUM & MAG HYDROXIDE-SIMETH 200-200-20 MG/5ML PO SUSP: 30.0000 mL | ORAL | Status: DC | PRN

## 2022-09-27 MED ORDER — MEXILETINE HCL 250 MG PO CAPS
250.0000 mg | ORAL_CAPSULE | Freq: Two times a day (BID) | ORAL | Status: DC
  Administered 2022-09-27 – 2022-10-17 (×39): 250 mg via ORAL
  Filled 2022-09-27 (×41): qty 1

## 2022-09-27 MED ORDER — TRAMADOL HCL 50 MG PO TABS
50.0000 mg | ORAL_TABLET | ORAL | Status: DC | PRN
  Administered 2022-10-08: 50 mg via ORAL
  Filled 2022-09-27: qty 1

## 2022-09-27 MED ORDER — ADULT MULTIVITAMIN W/MINERALS CH
1.0000 | ORAL_TABLET | Freq: Every day | ORAL | Status: DC
  Administered 2022-09-28 – 2022-10-17 (×20): 1 via ORAL
  Filled 2022-09-27 (×20): qty 1

## 2022-09-27 MED ORDER — WARFARIN - PHARMACIST DOSING INPATIENT: Freq: Every day | Status: DC

## 2022-09-27 MED ORDER — SODIUM CHLORIDE 0.9% FLUSH: 3.0000 mL | INTRAVENOUS | Status: DC | PRN

## 2022-09-27 MED ORDER — GERHARDT'S BUTT CREAM
TOPICAL_CREAM | Freq: Three times a day (TID) | CUTANEOUS | Status: DC
  Filled 2022-09-27: qty 1

## 2022-09-27 MED ORDER — MIDODRINE HCL 5 MG PO TABS
5.0000 mg | ORAL_TABLET | Freq: Three times a day (TID) | ORAL | Status: DC
  Administered 2022-09-27 – 2022-09-28 (×2): 5 mg via ORAL
  Filled 2022-09-27 (×2): qty 1

## 2022-09-27 MED ORDER — CEFTRIAXONE IV (FOR PTA / DISCHARGE USE ONLY): 2.0000 g | Freq: Two times a day (BID) | INTRAVENOUS | 0 refills | Status: DC

## 2022-09-27 MED ORDER — SILDENAFIL CITRATE 20 MG PO TABS
20.0000 mg | ORAL_TABLET | Freq: Three times a day (TID) | ORAL | Status: DC
  Administered 2022-09-27 – 2022-09-30 (×9): 20 mg via ORAL
  Filled 2022-09-27 (×9): qty 1

## 2022-09-27 MED ORDER — ACETAMINOPHEN 325 MG PO TABS: 325.0000 mg | ORAL_TABLET | ORAL | Status: DC | PRN

## 2022-09-27 MED ORDER — SODIUM CHLORIDE 0.9 % IV SOLN
2.0000 g | Freq: Two times a day (BID) | INTRAVENOUS | Status: DC
  Administered 2022-09-27 – 2022-10-17 (×40): 2 g via INTRAVENOUS
  Filled 2022-09-27 (×41): qty 20

## 2022-09-27 MED ORDER — UMECLIDINIUM BROMIDE 62.5 MCG/ACT IN AEPB
1.0000 | INHALATION_SPRAY | Freq: Every day | RESPIRATORY_TRACT | Status: DC
  Administered 2022-09-28 – 2022-10-17 (×16): 1 via RESPIRATORY_TRACT
  Filled 2022-09-27 (×3): qty 7

## 2022-09-27 MED ORDER — ASPIRIN 81 MG PO TBEC
81.0000 mg | DELAYED_RELEASE_TABLET | Freq: Every day | ORAL | Status: DC
  Administered 2022-09-28 – 2022-10-17 (×20): 81 mg via ORAL
  Filled 2022-09-27 (×20): qty 1

## 2022-09-27 MED ORDER — ONDANSETRON HCL 4 MG PO TABS: 4.0000 mg | ORAL_TABLET | Freq: Four times a day (QID) | ORAL | Status: DC | PRN

## 2022-09-27 MED ORDER — MELATONIN 3 MG PO TABS: 3.0000 mg | ORAL_TABLET | Freq: Every day | ORAL | Status: DC

## 2022-09-27 MED ORDER — ENSURE ENLIVE PO LIQD
237.0000 mL | Freq: Three times a day (TID) | ORAL | Status: DC
  Administered 2022-09-27 – 2022-10-03 (×8): 237 mL via ORAL

## 2022-09-27 MED ORDER — DRONABINOL 2.5 MG PO CAPS: 2.5000 mg | ORAL_CAPSULE | Freq: Two times a day (BID) | ORAL | Status: DC

## 2022-09-27 MED ORDER — ASPIRIN 81 MG PO TBEC: 81.0000 mg | DELAYED_RELEASE_TABLET | Freq: Every day | ORAL | Status: DC

## 2022-09-27 MED ORDER — SILDENAFIL CITRATE 20 MG PO TABS: 20.0000 mg | ORAL_TABLET | Freq: Three times a day (TID) | ORAL | Status: DC

## 2022-09-27 MED ORDER — MELATONIN 3 MG PO TABS
3.0000 mg | ORAL_TABLET | Freq: Every day | ORAL | Status: DC
  Administered 2022-09-27 – 2022-09-29 (×3): 3 mg via ORAL
  Filled 2022-09-27 (×3): qty 1

## 2022-09-27 MED ORDER — SERTRALINE HCL 50 MG PO TABS: 50.0000 mg | ORAL_TABLET | Freq: Every day | ORAL | Status: DC

## 2022-09-27 MED ORDER — MIRTAZAPINE 15 MG PO TBDP: 7.5000 mg | ORAL_TABLET | Freq: Every day | ORAL | Status: DC

## 2022-09-27 MED ORDER — LATANOPROST 0.005 % OP SOLN
1.0000 [drp] | Freq: Every day | OPHTHALMIC | Status: DC
  Administered 2022-09-27 – 2022-10-16 (×18): 1 [drp] via OPHTHALMIC
  Filled 2022-09-27: qty 2.5

## 2022-09-27 MED ORDER — AMPICILLIN IV (FOR PTA / DISCHARGE USE ONLY): 12.0000 g | INTRAVENOUS | 0 refills | Status: DC

## 2022-09-27 MED ORDER — ADULT MULTIVITAMIN W/MINERALS CH: 1.0000 | ORAL_TABLET | Freq: Every day | ORAL | Status: DC

## 2022-09-27 MED ORDER — OXYCODONE HCL 5 MG PO TABS: 5.0000 mg | ORAL_TABLET | ORAL | Status: DC | PRN

## 2022-09-27 MED ORDER — SERTRALINE HCL 50 MG PO TABS
50.0000 mg | ORAL_TABLET | Freq: Every day | ORAL | Status: DC
  Administered 2022-09-28 – 2022-10-17 (×20): 50 mg via ORAL
  Filled 2022-09-27 (×20): qty 1

## 2022-09-27 MED ORDER — MOMETASONE FURO-FORMOTEROL FUM 200-5 MCG/ACT IN AERO
2.0000 | INHALATION_SPRAY | Freq: Two times a day (BID) | RESPIRATORY_TRACT | Status: DC
  Administered 2022-09-27 – 2022-10-17 (×35): 2 via RESPIRATORY_TRACT
  Filled 2022-09-27: qty 8.8

## 2022-09-27 MED ORDER — CHLORHEXIDINE GLUCONATE CLOTH 2 % EX PADS: 6.0000 | MEDICATED_PAD | Freq: Every day | CUTANEOUS | Status: DC

## 2022-09-27 MED ORDER — ORAL CARE MOUTH RINSE: 15.0000 mL | OROMUCOSAL | Status: DC | PRN

## 2022-09-27 MED ORDER — DIGOXIN 125 MCG PO TABS: 0.1250 mg | ORAL_TABLET | Freq: Every day | ORAL | Status: DC

## 2022-09-27 MED ORDER — GUAIFENESIN-DM 100-10 MG/5ML PO SYRP: 10.0000 mL | ORAL_SOLUTION | Freq: Four times a day (QID) | ORAL | Status: DC | PRN

## 2022-09-27 MED ORDER — WARFARIN SODIUM 1 MG PO TABS: 0.5000 mg | ORAL_TABLET | Freq: Every day | ORAL | Status: DC

## 2022-09-27 MED ORDER — SODIUM CHLORIDE 0.9 % IV SOLN
2.0000 g | INTRAVENOUS | Status: DC
  Administered 2022-09-27 – 2022-10-17 (×118): 2 g via INTRAVENOUS
  Filled 2022-09-27 (×123): qty 2000

## 2022-09-27 NOTE — Progress Notes (Addendum)
Occupational Therapy Treatment Patient Details Name: Charles Holmes MRN: [id number] DOB: 1965-11-03 Today's Date: 09/27/2022   History of present illness Pt is 57 year old presented to [facility] on  08/10/22 for acute on chronic systolic heart failure. Pt had IABP placed on 08/25/22 for ongoing cardiogenic shock. Underwent placement of LVAD on 8/13. Developed lt sided weakness on 8/14. Rt MCA CVA. Underwent mechanical thrombectomy by IR. Extubated 8/15.  PMH - CAD, CHF, HLD, HTN, systolic HF, ICD implant, rt CVA.  LVAD placement and new stroke R MCA M1.   OT comments  Pt progressing towards goals this session, noted incr LUE edema this session, pt reports it feels "heavy". Performed manual mobilization techniques for edema reduction on LUE and elevated on 2 pillows, pt also performing therex seated EOB and up in chair, this session needing assist at elbow to support arm during functional reaching/grasping/release task. Pt presenting with impairments listed below, will follow acutely. Patient will benefit from intensive inpatient follow up therapy, >3 hours/day to maximize safety/ind with ADLs/functional mobility.  PF 3.9 PS 5400  PI 5.0 PP 3.9 No alarming of system noted during session.       If plan is discharge home, recommend the following:  Two people to help with walking and/or transfers;Help with stairs or ramp for entrance;Assistance with feeding;Assist for transportation;Assistance with cooking/housework;A lot of help with bathing/dressing/bathroom   Equipment Recommendations  Other (comment) (defer)    Recommendations for Other Services PT consult    Precautions / Restrictions Precautions Precautions: Sternal;Fall Precaution Comments: LVAD Restrictions Other Position/Activity Restrictions: Sternal Precautions       Mobility Bed Mobility Overal bed mobility: Needs Assistance Bed Mobility: Rolling, Sidelying to Sit Rolling: Min assist Sidelying to sit: Mod assist, HOB  elevated Supine to sit: Mod assist Sit to supine: Min assist        Transfers Overall transfer level: Needs assistance Equipment used: 1 person hand held assist Transfers: Bed to chair/wheelchair/BSC, Sit to/from Stand Sit to Stand: Min assist     Step pivot transfers: Min assist           Balance Overall balance assessment: Needs assistance Sitting-balance support: Feet supported, No upper extremity supported Sitting balance-Leahy Scale: Fair Sitting balance - Comments: once in supportive position with bilat feet on the floor pt able to maintain static sitting EOB balance however requires unilateral UE support during dynamic sitting balance Postural control: Posterior lean, Left lateral lean Standing balance support: Bilateral upper extremity supported Standing balance-Leahy Scale: Poor                             ADL either performed or assessed with clinical judgement   ADL Overall ADL's : Needs assistance/impaired                         Toilet Transfer: Minimal assistance Toilet Transfer Details (indicate cue type and reason): 1 person HHA         Functional mobility during ADLs: Minimal assistance      Extremity/Trunk Assessment Upper Extremity Assessment Upper Extremity Assessment: Right hand dominant RUE Deficits / Details: Globally LUE Deficits / Details: incr edema, still with 2/5 composite flex, ext, using RUE to assit LUE this session due to feeling "heavy" LUE Coordination: decreased fine motor;decreased gross motor   Lower Extremity Assessment Lower Extremity Assessment: Defer to PT evaluation        Vision  Vision Assessment?: No apparent visual deficits   Perception Perception Perception: Not tested   Praxis Praxis Praxis: Not tested    Cognition Arousal: Alert Behavior During Therapy: WFL for tasks assessed/performed Overall Cognitive Status: Impaired/Different from baseline Area of Impairment:  Safety/judgement, Problem solving, Memory                     Memory: Decreased recall of precautions Following Commands: Follows one step commands consistently Safety/Judgement: Decreased awareness of deficits Awareness: Emergent Problem Solving: Slow processing General Comments: min cues for sternal prec this session,        Exercises General Exercises - Upper Extremity Shoulder Flexion: AROM, 5 reps, Left, Supine, AAROM Elbow Flexion: AAROM, Left, 5 reps, Supine Elbow Extension: AAROM, Left, 5 reps, Supine Digit Composite Flexion: AROM, Left, 20 reps Composite Extension: AROM, Left, 15 reps Other Exercises Other Exercises: manual technique for edema mobilization Other Exercises: seated functional reach/grasp/release x5 AAROM (elbow supported)    Shoulder Instructions       General Comments VSS on RA    Pertinent Vitals/ Pain       Pain Assessment Pain Assessment: No/denies pain  Home Living                                          Prior Functioning/Environment              Frequency  Min 1X/week        Progress Toward Goals  OT Goals(current goals can now be found in the care plan section)  Progress towards OT goals: Progressing toward goals  Acute Rehab OT Goals Patient Stated Goal: none stated OT Goal Formulation: With patient Time For Goal Achievement: 09/28/22 Potential to Achieve Goals: Fair ADL Goals Pt Will Perform Grooming: with contact guard assist;sitting Pt Will Perform Upper Body Bathing: with min assist;sitting Pt Will Perform Upper Body Dressing: with mod assist;sitting Pt Will Transfer to Toilet: with min assist;stand pivot transfer;bedside commode Additional ADL Goal #1: Tolerate sitting EOB up to 5 min with supervision to increase independince with toileting.  Plan Discharge plan remains appropriate    Co-evaluation                 AM-PAC OT "6 Clicks" Daily Activity     Outcome Measure    Help from another person eating meals?: A Little Help from another person taking care of personal grooming?: A Little Help from another person toileting, which includes using toliet, bedpan, or urinal?: A Lot Help from another person bathing (including washing, rinsing, drying)?: A Lot Help from another person to put on and taking off regular upper body clothing?: A Lot Help from another person to put on and taking off regular lower body clothing?: A Lot 6 Click Score: 14    End of Session Equipment Utilized During Treatment: Gait belt  OT Visit Diagnosis: Unsteadiness on feet (R26.81);Other abnormalities of gait and mobility (R26.89);Muscle weakness (generalized) (M62.81)   Activity Tolerance Patient tolerated treatment well   Patient Left in chair;with call bell/phone within reach;with chair alarm set   Nurse Communication Mobility status        Time: 6295-2841 OT Time Calculation (min): 44 min  Charges: OT General Charges $OT Visit: 1 Visit OT Treatments $Self Care/Home Management : 8-22 mins $Therapeutic Activity: 8-22 mins $Therapeutic Exercise: 8-22 mins  Carver Fila, OTD, OTR/L SecureChat Preferred Acute  Rehab [phone]) [phone]   Dalphine Handing 09/27/2022, 12:07 PM

## 2022-09-27 NOTE — Progress Notes (Signed)
Horton Chin, MD  Physician Physical Medicine and Rehabilitation   PMR Pre-admission    Signed   Date of Service: 09/08/2022  2:03 PM  Related encounter: ED to Hosp-Admission (Discharged) from 08/10/2022 in [facility] PROGRESSIVE CARE   Signed     Expand All Collapse All  Show:Clear all [x] Written[x] Templated[x] Copied  Added by: [x] Standley Brooking, RN[x] Theodoro Kos, Lauren Demetrius Charity, CCC-SLP  [] Hover for details PMR Admission Coordinator Pre-Admission Assessment   Patient: Charles Holmes is an 57 y.o., male MRN: [id number] DOB: 1965-06-25 Height: 5\' 7"  (170.2 cm) Weight: 79.4 kg                                                                                                                                                  Insurance Information HMO: yes    PPO:      PCP:      IPA:      80/20:      OTHER:  PRIMARY: U[employer] Medicare      Policy#: [id number]      Subscriber: patient CM Name: [name]      Phone#: [phone] option #3     Fax#: [phone] Pre-Cert#: [id number]   approved 9/11 until 9/17 with updates due 9/18   Employer:  Benefits:  Phone #: [phone]     Name: 9/10 Eff. Date: 07/17/22     Deduct: none      Out of Pocket Max: $3600      Life Max: None CIR:  $295 co pay per day days 1 until 5     SNF: no copay per day days 1 until 20; $203 co pay per day days 21 until 100 Outpatient: $20 co pay per visit     Co-Pay: visits per medical neccesity Home Health: 100%      Co-Pay: visits per medical neccesity DME: 80%     Co-Pay: 20% Providers: in-network  SECONDARY: Medicaid of [place]      Policy#: [id number]      Phone#: [phone] 9/10 not currently active   Financial Counselor:       Phone#:    The "Data Collection Information Summary" for patients in Inpatient Rehabilitation Facilities with attached "Privacy Act Statement-Health Care Records" was provided and verbally reviewed with: Patient and Family   Emergency Contact Information Contact Information        Name Relation Home Work [place] Daughter [phone]   [phone]         Other Contacts       Name Relation Home Work Mobile    [place] Cousin     [phone]         Current Medical History  Patient Admitting Diagnosis: CVA, New LVAD   History of Present Illness: Pt is a 57 y/o male with a history of chronic systolic  CHF, CAD, HLD, HTN, ICD implant, right CVA.Marland Kitchen Pt presented to [facility] on 08/10/22 for with SOB and was found to be in marked volume overload and class IV HF with low output. Taken same day to cath lab. Angio showed mild nonobstructive CAD. Severe NICM with severe elevated filling pressures and low output consistent with cardiogenic shock. Began Milrinone and NE support. Diuresed. CT surgery consulted for LVAD candidacy. Pulmonary consulted for pre VAD optimization. Oral surgery consulted for tooth extractions. Underwent LVAD implant on 8/13 and clipping left atrial appendage due to severe thickening and invagination of mitral valve annulus impeding flows.    Postoperative found with left sided hemiplegia. CT imaging revealed acute right MCA infarct. Taken to IR for thrombectomy. Follow up CT with small to moderate area of hemorrhagic conversion. Placed back on Wafarin and Asa.    Postop course further complicated with enterococcus faecalis bacteremia on 9/6 with ID consulted. Recommend ampicillin and ceftriaxone  for 6 weeks and long term suppression with amoxicillin.   PICC removed.     TComplete NIHSS TOTAL: 4 Glasgow Coma Scale Score: 15   Patient's medical record from [facility] has been reviewed by the rehabilitation admission coordinator and physician.   Past Medical History      Past Medical History:  Diagnosis Date   CAD (coronary artery disease)     CHF (congestive heart failure) (HCC)     GERD (gastroesophageal reflux disease)     Hyperlipidemia     Hypertension     Systolic heart failure (HCC) 2021     LVEF 18%, RVEF 38% on cardiac MRI 12/19/2019. possible cardiac sarcoidosis.   Wide-complex tachycardia 2021    wears LifeVest        Has the patient had major surgery during 100 days prior to admission? Yes   Family History  family history includes Heart disease in his father and mother; Hypertension in his father.   Current Medications   Current Medications    Current Facility-Administered Medications:    0.9 %  sodium chloride infusion (Manually program via Guardrails IV Fluids), , Intravenous, Once, Andrey Farmer, PA-C   0.9 %  sodium chloride infusion, 250 mL, Intravenous, PRN, Lovett Sox, MD, Stopped at 09/13/22 2035   acetaminophen (TYLENOL) 160 MG/5ML solution 650 mg, 650 mg, Oral, Q4H PRN, Bensimhon, Bevelyn Buckles, MD, 650 mg at 09/22/22 2024   albuterol (PROVENTIL) (2.5 MG/3ML) 0.083% nebulizer solution 2.5 mg, 2.5 mg, Nebulization, Q4H PRN, Lovett Sox, MD   ampicillin (OMNIPEN) 2 g in sodium chloride 0.9 % 100 mL IVPB, 2 g, Intravenous, Q4H, Bensimhon, Bevelyn Buckles, MD, Last Rate: 300 mL/hr at 09/27/22 1225, 2 g at 09/27/22 1225   aspirin EC tablet 81 mg, 81 mg, Oral, Daily, Bensimhon, Bevelyn Buckles, MD, 81 mg at 09/27/22 0933   atorvastatin (LIPITOR) tablet 80 mg, 80 mg, Oral, Daily, Bensimhon, Bevelyn Buckles, MD, 80 mg at 09/27/22 0932   cefTRIAXone (ROCEPHIN) 2 g in sodium chloride 0.9 % 100 mL IVPB, 2 g, Intravenous, Q12H, Bensimhon, Bevelyn Buckles, MD, Last Rate: 200 mL/hr at 09/27/22 0937, 2 g at 09/27/22 0937   Chlorhexidine Gluconate Cloth 2 % PADS 6 each, 6 each, Topical, Q0600, Bensimhon, Bevelyn Buckles, MD, 6 each at 09/27/22 0934   dextrose 50 % solution 0-50 mL, 0-50 mL, Intravenous, PRN, Lovett Sox, MD, 50 mL at 09/23/22 0620   digoxin (LANOXIN) tablet 0.125 mg, 0.125 mg, Oral, Daily, Laurey Morale, MD, 0.125 mg at 09/27/22 [phone]  dorzolamide-timolol (COSOPT) 2-0.5 % ophthalmic solution 1 drop, 1 drop, Both Eyes, BID, Lovett Sox, MD, 1 drop at 09/27/22 0929    dronabinol (MARINOL) capsule 2.5 mg, 2.5 mg, Oral, BID AC, Laurey Morale, MD, 2.5 mg at 09/27/22 1223   feeding supplement (ENSURE ENLIVE / ENSURE PLUS) liquid 237 mL, 237 mL, Oral, TID BM, Bensimhon, Bevelyn Buckles, MD, 237 mL at 09/26/22 2030   Gerhardt's butt cream, , Topical, TID, Alen Bleacher, NP, Given at 09/27/22 0930   insulin aspart (novoLOG) injection 0-24 Units, 0-24 Units, Subcutaneous, TID WC & HS, Lovett Sox, MD, 2 Units at 09/26/22 2216   latanoprost (XALATAN) 0.005 % ophthalmic solution 1 drop, 1 drop, Both Eyes, QHS, Vantrigt, Peter, MD, 1 drop at 09/26/22 2216   melatonin tablet 3 mg, 3 mg, Oral, QHS, Sabharwal, Aditya, DO, 3 mg at 09/26/22 2214   mexiletine (MEXITIL) capsule 250 mg, 250 mg, Oral, BID, Bensimhon, Bevelyn Buckles, MD, 250 mg at 09/27/22 0933   midodrine (PROAMATINE) tablet 5 mg, 5 mg, Oral, Q8H, Andrey Farmer, PA-C, 5 mg at 09/27/22 0631   mirtazapine (REMERON SOL-TAB) disintegrating tablet 7.5 mg, 7.5 mg, Oral, QHS, Lovett Sox, MD, 7.5 mg at 09/26/22 2213   mometasone-formoterol (DULERA) 200-5 MCG/ACT inhaler 2 puff, 2 puff, Inhalation, BID, Lovett Sox, MD, 2 puff at 09/27/22 0834   multivitamin with minerals tablet 1 tablet, 1 tablet, Oral, Daily, Bensimhon, Bevelyn Buckles, MD, 1 tablet at 09/27/22 0932   ondansetron (ZOFRAN) injection 4 mg, 4 mg, Intravenous, Q6H PRN, Lovett Sox, MD   Oral care mouth rinse, 15 mL, Mouth Rinse, 4 times per day, Bensimhon, Bevelyn Buckles, MD, 15 mL at 09/27/22 1251   Oral care mouth rinse, 15 mL, Mouth Rinse, PRN, Bensimhon, Bevelyn Buckles, MD   oxyCODONE (Oxy IR/ROXICODONE) immediate release tablet 5 mg, 5 mg, Oral, Q4H PRN, Bensimhon, Bevelyn Buckles, MD, 5 mg at 09/07/22 1652   pantoprazole (PROTONIX) EC tablet 40 mg, 40 mg, Oral, Daily, Bensimhon, Bevelyn Buckles, MD, 40 mg at 09/26/22 2214   polyethylene glycol (MIRALAX / GLYCOLAX) packet 17 g, 17 g, Oral, Daily PRN, Bensimhon, Bevelyn Buckles, MD, 17 g at 09/23/22 4098   [COMPLETED] sertraline  (ZOLOFT) tablet 25 mg, 25 mg, Oral, Daily, 25 mg at 09/18/22 0943 **FOLLOWED BY** sertraline (ZOLOFT) tablet 50 mg, 50 mg, Oral, Daily, Bensimhon, Bevelyn Buckles, MD, 50 mg at 09/27/22 0932   sildenafil (REVATIO) tablet 20 mg, 20 mg, Oral, TID, Bensimhon, Bevelyn Buckles, MD, 20 mg at 09/27/22 0933   sodium chloride (OCEAN) 0.65 % nasal spray 2 spray, 2 spray, Each Nare, PRN, Bensimhon, Bevelyn Buckles, MD   sodium chloride flush (NS) 0.9 % injection 3 mL, 3 mL, Intravenous, Q12H, Vantrigt, Peter, MD, 3 mL at 09/26/22 2217   sodium chloride flush (NS) 0.9 % injection 3 mL, 3 mL, Intravenous, PRN, Lovett Sox, MD   traMADol Janean Sark) tablet 50-100 mg, 50-100 mg, Oral, Q4H PRN, Lovett Sox, MD, 100 mg at 09/04/22 1459   umeclidinium bromide (INCRUSE ELLIPTA) 62.5 MCG/ACT 1 puff, 1 puff, Inhalation, Daily, Lovett Sox, MD, 1 puff at 09/27/22 1191   Warfarin - Pharmacist Dosing Inpatient, , Does not apply, q1600, Lovett Sox, MD, Given at 09/26/22 1836     Patients Current Diet:  Diet Order                  Diet regular Room service appropriate? Yes; Fluid consistency: Thin  Diet effective now  Precautions / Restrictions Precautions Precautions: Sternal, Fall Precaution Comments: LVAD Restrictions Weight Bearing Restrictions: No RUE Weight Bearing: Non weight bearing RLE Weight Bearing: Non weight bearing Other Position/Activity Restrictions: Sternal Precautions    Has the patient had 2 or more falls or a fall with injury in the past year?No   Prior Activity Level Community (5-7x/wk): drives, gets out of house often   Prior Functional Level Prior Function Prior Level of Function : Independent/Modified Independent, Driving ADLs Comments: does cooking / cleaning   Self Care: Did the patient need help bathing, dressing, using the toilet or eating?  Independent   Indoor Mobility: Did the patient need assistance with walking from room to room (with or without  device)? Independent   Stairs: Did the patient need assistance with internal or external stairs (with or without device)? Independent   Functional Cognition: Did the patient need help planning regular tasks such as shopping or remembering to take medications? Independent   Patient Information Are you of Hispanic, Latino/a,or Spanish origin?: A. No, not of Hispanic, Latino/a, or Spanish origin What is your race?: A. White Do you need or want an interpreter to communicate with a doctor or health care staff?: 0. No   Patient's Response To:  Health Literacy and Transportation Is the patient able to respond to health literacy and transportation needs?: Yes Health Literacy - How often do you need to have someone help you when you read instructions, pamphlets, or other written material from your doctor or pharmacy?: Never In the past 12 months, has lack of transportation kept you from medical appointments or from getting medications?: No In the past 12 months, has lack of transportation kept you from meetings, work, or from getting things needed for daily living?: No   Journalist, newspaper / Equipment Home Assistive Devices/Equipment: None Home Equipment: None   Prior Device Use: Indicate devices/aids used by the patient prior to current illness, exacerbation or injury? None of the above   Current Functional Level Cognition   Overall Cognitive Status: Impaired/Different from baseline Orientation Level: Oriented X4 Following Commands: Follows one step commands consistently Safety/Judgement: Decreased awareness of deficits General Comments: min cues for sternal prec this session,    Extremity Assessment (includes Sensation/Coordination)   Upper Extremity Assessment: Right hand dominant RUE Deficits / Details: Globally RUE Sensation: WNL RUE Coordination: WNL LUE Deficits / Details: incr edema, still with 2/5 composite flex, ext, using RUE to assit LUE this session due to feeling  "heavy" LUE Sensation: WNL LUE Coordination: decreased fine motor, decreased gross motor  Lower Extremity Assessment: Defer to PT evaluation LLE Deficits / Details: grossly 3-/5     ADLs   Overall ADL's : Needs assistance/impaired Eating/Feeding: Sitting, Minimal assistance Grooming: Oral care, Brushing hair, Wash/dry face, Standing, Sitting, Contact guard assist, Minimal assistance Grooming Details (indicate cue type and reason): minA when standing for steadying assist Upper Body Bathing: Moderate assistance, Sitting Lower Body Bathing: Maximal assistance, Sitting/lateral leans Upper Body Dressing : Moderate assistance, Sitting Upper Body Dressing Details (indicate cue type and reason): doffling VAD vest Lower Body Dressing: Moderate assistance, Sitting/lateral leans Lower Body Dressing Details (indicate cue type and reason): donning socks, attempting figure 4 Toilet Transfer: Minimal assistance Toilet Transfer Details (indicate cue type and reason): 1 person HHA Toileting- Clothing Manipulation and Hygiene: Total assistance, Sit to/from stand, +2 for safety/equipment Toileting - Clothing Manipulation Details (indicate cue type and reason): total A in standing for rear peri care Functional mobility during ADLs: Minimal assistance General  ADL Comments: very limited standing tolerance     Mobility   Overal bed mobility: Needs Assistance Bed Mobility: Rolling, Sidelying to Sit Rolling: Min assist Sidelying to sit: Mod assist, HOB elevated Supine to sit: Mod assist Sit to supine: Min assist General bed mobility comments: min A to elevate feet     Transfers   Overall transfer level: Needs assistance Equipment used: 1 person hand held assist Transfers: Bed to chair/wheelchair/BSC, Sit to/from Stand Sit to Stand: Min assist Bed to/from chair/wheelchair/BSC transfer type:: Step pivot Step pivot transfers: Min assist Transfer via Lift Equipment: Stedy General transfer comment: pt  minA from elevated bed to power up, minA to steady during step pvt transfer to [facility] and then back to EOB. decreased L LE step height but no L knee buckling this date     Ambulation / Gait / Stairs / Wheelchair Mobility   Ambulation/Gait Ambulation/Gait assistance: Mod assist, +2 safety/equipment Gait Distance (Feet): 120 Feet Assistive device: Fara Boros Gait Pattern/deviations: Step-through pattern, Decreased step length - right, Decreased step length - left, Trunk flexed General Gait Details: pt requires minA to help guide eva walker, pt very dependent on Eva walker, limited use of L UE to suport self on regular RW, 2nd person for line management, constant verbal cues to stand up tall and look forward Gait velocity: decr Gait velocity interpretation: <1.31 ft/sec, indicative of household ambulator Pre-gait activities: w/shifting and low amplitude steps in place during 1 of the standing trials. Stairs assistance: Supervision Stair Management: One rail Left, Alternating pattern, Forwards General stair comments: ascended no rail, descended holding rail with cues     Posture / Balance Dynamic Sitting Balance Sitting balance - Comments: once in supportive position with bilat feet on the floor pt able to maintain static sitting EOB balance however requires unilateral UE support during dynamic sitting balance Balance Overall balance assessment: Needs assistance Sitting-balance support: Feet supported, No upper extremity supported Sitting balance-Leahy Scale: Fair Sitting balance - Comments: once in supportive position with bilat feet on the floor pt able to maintain static sitting EOB balance however requires unilateral UE support during dynamic sitting balance Postural control: Posterior lean, Left lateral lean Standing balance support: Bilateral upper extremity supported Standing balance-Leahy Scale: Poor Standing balance comment: min assist for static standing while PT performed hygiene s/p  BM Standardized Balance Assessment Standardized Balance Assessment : Dynamic Gait Index Dynamic Gait Index Level Surface: Normal Change in Gait Speed: Normal Gait with Horizontal Head Turns: Normal Gait with Vertical Head Turns: Normal Gait and Pivot Turn: Normal Step Over Obstacle: Normal Step Around Obstacles: Normal Steps: Mild Impairment Total Score: 23     Special needs/care consideration New LVAD this admit Palliative GOC as new LVAD 6 weeks IV antibiotics 9/7 until 10/19         Previous Home Environment  Living Arrangements: Alone Available Help at Discharge: Family, Available 24 hours/day (cousin and her spouse 24/7; dtr expecting baby in Sept. Her 3rd) Type of Home: Mobile home Home Layout: One level Home Access: Stairs to enter Entrance Stairs-Rails: Right, Left Entrance Stairs-Number of Steps: 1 Bathroom Shower/Tub: Health visitor: Standard Bathroom Accessibility: Yes How Accessible: Accessible via walker Home Care Services: No Additional Comments: If pt has LVAD he will go stay with cousins initially in 1 level home.   Discharge Living Setting Plans for Discharge Living Setting: House (cousin's house) Type of Home at Discharge: House Discharge Home Layout: One level Discharge Home Access: Stairs to enter Entrance  Stairs-Rails: None Entrance Stairs-Number of Steps: 2 Discharge Bathroom Shower/Tub: Tub/shower unit Discharge Bathroom Toilet: Standard Discharge Bathroom Accessibility: Yes How Accessible: Accessible via walker Does the patient have any problems obtaining your medications?: No   Social/Family/Support Systems Patient Roles: Parent Contact Information: daughter and cousin( to stay with cousin, [name]) Anticipated Caregiver: [name] and Dewayne Anticipated Caregiver's Contact Information: see contacts Ability/Limitations of Caregiver: none Caregiver Availability: 24/7 Discharge Plan Discussed with Primary Caregiver: Yes Is  Caregiver In Agreement with Plan?: Yes Does Caregiver/Family have Issues with Lodging/Transportation while Pt is in Rehab?: No   [name] is aware of long term home IV antibiotics and has done IV antibiotics before.   Goals Patient/Family Goal for Rehab: supervision to min asisst with PT and OT Expected length of stay: ELOS 14 to 20 days Pt/Family Agrees to Admission and willing to participate: Yes Program Orientation Provided & Reviewed with Pt/Caregiver Including Roles  & Responsibilities: Yes   Decrease burden of Care through IP rehab admission: NA   Possible need for SNF placement upon discharge:Not anticipated   Patient Condition: This patient's medical and functional status has changed since the consult dated: 09/08/22 in which the Rehabilitation Physician determined and documented that the patient's condition is appropriate for intensive rehabilitative care in an inpatient rehabilitation facility. See "History of Present Illness" (above) for medical update. Functional changes are: mod assist overall. Patient's medical and functional status update has been discussed with the Rehabilitation physician and patient remains appropriate for inpatient rehabilitation. Will admit to inpatient rehab today.   Preadmission Screen Completed By:  Ottie Glazier RN MSN 09/27/2022 1:17 PM ______________________________________________________________________   Discussed status with Dr. Carlis Abbott on 09/27/22 at 1318 and received approval for admission today.   Admission Coordinator: Ottie Glazier RN MSN, time 1318 Date 09/27/22            Revision History  Routing History

## 2022-09-27 NOTE — Progress Notes (Signed)
CARDIAC REHAB PHASE I     Pt for discharge to CIR today. CRP2 sent to [place], if and when pt able to attend.    Woodroe Chen, RN BSN 09/27/2022 3:09 PM

## 2022-09-27 NOTE — Progress Notes (Signed)
Ranelle Oyster, MD  Physician Physical Medicine and Rehabilitation   Consult Note    Signed   Date of Service: 09/08/2022 12:07 PM  Related encounter: ED to Hosp-Admission (Discharged) from 08/10/2022 in [place] 2C CV PROGRESSIVE CARE   Signed     Expand All Collapse All  Show:Clear all [x] Written[x] Templated[] Copied  Added by: [x] Ranelle Oyster, MD  [] Hover for details          Physical Medicine and Rehabilitation Consult Reason for Consult:debility and left sided weakness Referring Physician: Bensimhon     HPI: Charles Holmes is a 57 y.o. male with a history of chronic systolic CHF who presented on 1/61/09 for with SOB and was found to be in cardiogenic shock. Pt was found to have multiple dental caries as well and 13 teeth extracted on 8/6. In the ICU an IABP was placed and ultimately the pt underwent LAVD placement on 8/13. On 8/14 pt was noted to have left sided weakness. CT demonstrated acute right MCA infarct. Pt had thrombectomy, some hemorrhagic conversion in area of stroke. Pt has remained in the CICU for ongoing medical mgt. He is currently on empiric merrem and vanc for leukocytosis and fever. He is still dealing with a lot of fatigue. I asked him how his therapy went yesterday and he told me it wore him out. With PT yesterday pt performed bed and sit-std transfers which were done with mod assist +2. He was able to stand for peri-care 20-25 seconds. He has not yet ambulated. PTA pt was independent in his mobile home in [place]. Nobody is with him at home, but he says he has a cousin (and his wife) with whom he can stay after discharge. Their home is on one level.     Review of Systems  Constitutional:  Positive for malaise/fatigue. Negative for fever.  HENT: Negative.    Eyes: Negative.   Respiratory:  Positive for cough, shortness of breath and wheezing.   Cardiovascular:  Positive for leg swelling.  Gastrointestinal:  Positive for nausea. Negative  for vomiting.  Genitourinary:  Negative for dysuria.  Musculoskeletal:  Positive for myalgias.  Neurological:  Positive for sensory change, speech change, focal weakness and weakness.  Psychiatric/Behavioral:  Negative for suicidal ideas.         Past Medical History:  Diagnosis Date   CAD (coronary artery disease)     CHF (congestive heart failure) (HCC)     GERD (gastroesophageal reflux disease)     Hyperlipidemia     Hypertension     Systolic heart failure (HCC) 2021    LVEF 18%, RVEF 38% on cardiac MRI 12/19/2019. possible cardiac sarcoidosis.   Wide-complex tachycardia 2021    wears LifeVest             Past Surgical History:  Procedure Laterality Date   BIV UPGRADE N/A 06/07/2021    Procedure: BIV ICD UPGRADE;  Surgeon: Regan Lemming, MD;  Location: [facility] INVASIVE CV LAB;  Service: Cardiovascular;  Laterality: N/A;   CLIPPING OF ATRIAL APPENDAGE Left 08/29/2022    Procedure: CLIPPING OF ATRIAL APPENDAGE;  Surgeon: Lovett Sox, MD;  Location: [facility] OR;  Service: Open Heart Surgery;  Laterality: Left;   IABP INSERTION N/A 08/25/2022    Procedure: IABP Insertion;  Surgeon: Dolores Patty, MD;  Location: [facility] INVASIVE CV LAB;  Service: Cardiovascular;  Laterality: N/A;   ICD IMPLANT N/A 02/20/2020    Procedure: ICD IMPLANT;  Surgeon: Regan Lemming, MD;  Location: [facility] INVASIVE CV LAB;  Service: Cardiovascular;  Laterality: N/A;   INSERTION OF IMPLANTABLE LEFT VENTRICULAR ASSIST DEVICE N/A 08/29/2022    Procedure: INSERTION OF IMPLANTABLE LEFT VENTRICULAR ASSIST DEVICE;  Surgeon: Lovett Sox, MD;  Location: [facility] OR;  Service: Open Heart Surgery;  Laterality: N/A;   IR CT HEAD LTD   07/10/2022   IR CT HEAD LTD   08/30/2022   IR PERCUTANEOUS ART THROMBECTOMY/INFUSION INTRACRANIAL INC DIAG ANGIO   07/10/2022   IR PERCUTANEOUS ART THROMBECTOMY/INFUSION INTRACRANIAL INC DIAG ANGIO   08/30/2022   IR US GUIDE VASC ACCESS LEFT   08/30/2022   IR US GUIDE VASC ACCESS RIGHT    07/10/2022   RADIOLOGY WITH ANESTHESIA N/A 07/10/2022    Procedure: RADIOLOGY WITH ANESTHESIA;  Surgeon: Radiologist, Medication, MD;  Location: [facility] OR;  Service: Radiology;  Laterality: N/A;   RADIOLOGY WITH ANESTHESIA N/A 08/30/2022    Procedure: IR WITH ANESTHESIA;  Surgeon: Julieanne Cotton, MD;  Location: [facility] OR;  Service: Radiology;  Laterality: N/A;   REMOVAL OF IMPELLA LEFT VENTRICULAR ASSIST DEVICE N/A 08/29/2022    Procedure: REMOVAL OF INTRA-AORTIC BALLON PUMP;  Surgeon: Lovett Sox, MD;  Location: [facility] OR;  Service: Open Heart Surgery;  Laterality: N/A;   RIGHT HEART CATH N/A 07/14/2022    Procedure: RIGHT HEART CATH;  Surgeon: Dolores Patty, MD;  Location: [facility] INVASIVE CV LAB;  Service: Cardiovascular;  Laterality: N/A;   RIGHT HEART CATH N/A 08/25/2022    Procedure: RIGHT HEART CATH;  Surgeon: Dolores Patty, MD;  [facility]: [facility] INVASIVE CV LAB;  Service: Cardiovascular;  Laterality: N/A;   RIGHT/LEFT HEART CATH AND CORONARY ANGIOGRAPHY N/A 12/16/2019    Procedure: RIGHT/LEFT HEART CATH AND CORONARY ANGIOGRAPHY;  Surgeon: [place]azil[place], Peter M, MD;  Location: [facility] INVASIVE CV LAB;  Service: Cardiovascular;  Laterality: N/A;   RIGHT/LEFT HEART CATH AND CORONARY ANGIOGRAPHY N/A 08/10/2022    Procedure: RIGHT/LEFT HEART CATH AND CORONARY ANGIOGRAPHY;  Surgeon: Dolores Patty, MD;  Location: [facility] INVASIVE CV LAB;  Service: Cardiovascular;  Laterality: N/A;   TEE WITHOUT CARDIOVERSION N/A 08/29/2022    Procedure: TRANSESOPHAGEAL ECHOCARDIOGRAM;  Surgeon: Lovett Sox, MD;  Location: [facility] OR;  Service: Open Heart Surgery;  Laterality: N/A;   TOOTH EXTRACTION N/A 08/22/2022    Procedure: DENTAL RESTORATION/EXTRACTIONS;  Surgeon: Ocie Doyne, DMD;  Location: [facility] OR;  Service: Oral Surgery;  Laterality: N/A;             Family History  Problem Relation Age of Onset   Hypertension Father     Heart disease Father     Heart disease Mother          Social History:  reports that he has  quit smoking. His smoking use included cigarettes. He has never used smokeless tobacco. He reports current alcohol use of about 6.0 standard drinks of alcohol per week. He reports current drug use. Drug: Marijuana. Allergies:  Allergies       Allergies  Allergen Reactions   Amiodarone        Severe tremors   Percocet [Oxycodone-Acetaminophen] Itching            Medications Prior to Admission  Medication Sig Dispense Refill   apixaban (ELIQUIS) 5 MG TABS tablet Take 1 tablet (5 mg total) by mouth 2 (two) times daily. 180 tablet 3   atorvastatin (LIPITOR) 80 MG tablet Take 1 tablet (80 mg total) by mouth daily. 90 tablet 3   carvedilol (COREG) 3.125 MG tablet Take 1  tablet (3.125 mg total) by mouth 2 (two) times daily. 180 tablet 3   dapagliflozin propanediol (FARXIGA) 10 MG TABS tablet Take 1 tablet (10 mg total) by mouth daily before breakfast. 90 tablet 3   dorzolamide-timolol (COSOPT) 22.3-6.8 MG/ML ophthalmic solution Place 1 drop into both eyes in the morning and at bedtime.       fluticasone-salmeterol (ADVAIR HFA) 230-21 MCG/ACT inhaler Inhale 2 puffs into the lungs 2 (two) times daily. 1 each 12   folic acid (FOLVITE) 1 MG tablet Take 1 tablet (1 mg total) by mouth daily. 90 tablet 3   furosemide (LASIX) 40 MG tablet Take 1 tablet (40 mg total) by mouth 2 (two) times daily. 90 tablet 3   latanoprost (XALATAN) 0.005 % ophthalmic solution Place 1 drop into both eyes at bedtime.       methotrexate (RHEUMATREX) 2.5 MG tablet Take 8 tablets (20 mg total) by mouth once a week. (Patient taking differently: Take 20 mg by mouth once a week. Saturdays) 96 tablet 3   mexiletine (MEXITIL) 250 MG capsule Take 1 capsule (250 mg total) by mouth 2 (two) times daily. 180 capsule 3   pantoprazole (PROTONIX) 40 MG tablet Take 40 mg by mouth daily before breakfast.              Home: Home Living Family/patient expects to be discharged to:: Private residence Living Arrangements: Alone Available  Help at Discharge: Available PRN/intermittently Type of Home: Mobile home Home Access: Stairs to enter [employer] of Steps: 2 Entrance Stairs-Rails: Right, Left Home Layout: One level Bathroom Shower/Tub: Health visitor: Standard Bathroom Accessibility: Yes Home Equipment: None Additional Comments: If pt has LVAD he will go stay with cousins initially in 1 level home.  Functional History: Prior Function Prior Level of Function : Independent/Modified Independent, Driving ADLs Comments: does cooking / cleaning Functional Status:  Mobility: Bed Mobility Overal bed mobility: Needs Assistance Bed Mobility: Supine to Sit Rolling: Min assist Sidelying to sit: Mod assist Supine to sit: Mod assist Sit to supine: +2 for physical assistance, Max assist General bed mobility comments: cued for sternal precautions and building some LE momentum to assist coming up.  needing mod truncal assist Transfers Overall transfer level: Needs assistance Equipment used: 2 person hand held assist, 1 person hand held assist (face to face transfer with L knee blocked) Transfers: Sit to/from Stand, Bed to chair/wheelchair/BSC Sit to Stand: +2 physical assistance, Mod assist, +2 safety/equipment (x4 from bed and BSC) Bed to/from chair/wheelchair/BSC transfer type:: Step pivot Step pivot transfers: Max assist, +2 physical assistance General transfer comment: Sit to stand x4 overall.  1 from lowered bed, 3 from BSC height.  Stability and RW assist for step pivot from bed to the [facility].  standing at [facility] x3 for ~ 20 to 25 sec  for peri care x3 Ambulation/Gait Ambulation/Gait assistance: Supervision Gait Distance (Feet): 670 Feet Assistive device: None Gait Pattern/deviations: Decreased stride length, Step-through pattern General Gait Details: limited to side steps along HOB Gait velocity: mildly decreased Gait velocity interpretation: >2.62 ft/sec, indicative of community  ambulatory Pre-gait activities: w/shifting and low amplitude steps in place during 1 of the standing trials.   ADL: ADL Overall ADL's : Needs assistance/impaired Eating/Feeding: Set up, Bed level Grooming: Minimal assistance, Bed level, Sitting Upper Body Bathing: Moderate assistance, Bed level Lower Body Bathing: Maximal assistance Upper Body Dressing : Moderate assistance Lower Body Dressing: Maximal assistance Lower Body Dressing Details (indicate cue type and reason): donning socks at  bed level Toilet Transfer: Moderate assistance, Tax adviser Details (indicate cue type and reason): simulated from bed>chair Toileting- Clothing Manipulation and Hygiene: Total assistance, Sit to/from stand, +2 for safety/equipment Toileting - Clothing Manipulation Details (indicate cue type and reason): total A in standing for rear peri care Functional mobility during ADLs: Moderate assistance General ADL Comments: very limited standing tolerance   Cognition: Cognition Overall Cognitive Status: Impaired/Different from baseline (NT formally) Orientation Level: Oriented X4 Cognition Arousal: Alert Behavior During Therapy: Restless Overall Cognitive Status: Impaired/Different from baseline (NT formally) Area of Impairment: Problem solving, Awareness, Following commands, Safety/judgement, Memory Memory: Decreased recall of precautions (requires constant verbal/tactile cues to adhere to sternal precautions functionally) Following Commands: Follows one step commands consistently, Follows one step commands with increased time Safety/Judgement: Decreased awareness of safety, Decreased awareness of deficits Awareness: Emergent Problem Solving: Slow processing, Requires verbal cues, Difficulty sequencing General Comments: requires maximal cues for precautions, poor delayed recall. very limited insight to deficits and safety.   Blood pressure (!) 74/56, pulse (!) 122, temperature 99.3 F  (37.4 C), temperature source Oral, resp. rate 19, height 5\' 7"  (1.702 m), weight 80.8 kg, SpO2 (!) 89%. Physical Exam Constitutional:      General: He is not in acute distress.    Appearance: He is obese. He is ill-appearing.  HENT:     Head: Normocephalic.     Nose: Nose normal.     Comments: O2 [place],NGT    Mouth/Throat:     Comments: edentulous Eyes:     Extraocular Movements: Extraocular movements intact.     Conjunctiva/sclera: Conjunctivae normal.     Pupils: Pupils are equal, round, and reactive to light.  Cardiovascular:     Comments: LVAD hum Pulmonary:     Effort: Pulmonary effort is normal.  Abdominal:     General: There is no distension.  Musculoskeletal:        General: Normal range of motion.     Cervical back: Normal range of motion.     Comments: Unnas in place bilateral LE. No edema present today  Skin:    General: Skin is warm.     Comments: Scattered bruises and ecchymoses. LVAD site not inspected  Neurological:     Comments: Pt is fairly alert. Oriented to month/year/place/name, why he's here. Reasonable insight and awareness. Speech slightly slurred. Left central 7. Seems to track to all fields without gross visual field loss noted. LUE grossly 2/5 prox to distal. LLE 2+ to 3-/5 prox to distal. Decreased LT and pain in left arm and leg. No abnl resting tone. DTR's 1+  Psychiatric:     Comments: Pt a little flat but generally pleasant and cooperative        Lab Results Last 24 Hours       Results for orders placed or performed during the hospital encounter of 08/10/22 (from the past 24 hour(s))  Glucose, capillary     Status: Abnormal    Collection Time: 09/07/22  3:58 PM  Result Value Ref Range    Glucose-Capillary 129 (H) 70 - 99 mg/dL  Glucose, capillary     Status: Abnormal    Collection Time: 09/07/22  7:37 PM  Result Value Ref Range    Glucose-Capillary 157 (H) 70 - 99 mg/dL  Glucose, capillary     Status: Abnormal    Collection Time: 09/07/22  11:14 PM  Result Value Ref Range    Glucose-Capillary 175 (H) 70 - 99 mg/dL  Glucose, capillary  Status: Abnormal    Collection Time: 09/08/22  3:17 AM  Result Value Ref Range    Glucose-Capillary 182 (H) 70 - 99 mg/dL  Lactate dehydrogenase     Status: Abnormal    Collection Time: 09/08/22  4:08 AM  Result Value Ref Range    LDH 350 (H) 98 - 192 U/L  Protime-INR     Status: None    Collection Time: 09/08/22  4:08 AM  Result Value Ref Range    Prothrombin Time 13.8 11.4 - 15.2 seconds    INR 1.0 0.8 - 1.2  APTT     Status: None    Collection Time: 09/08/22  4:08 AM  Result Value Ref Range    aPTT 35 24 - 36 seconds  Heparin level (unfractionated)     Status: Abnormal    Collection Time: 09/08/22  4:08 AM  Result Value Ref Range    Heparin Unfractionated <0.10 (L) 0.30 - 0.70 IU/mL  Cooxemetry Panel (carboxy, met, total hgb, O2 sat)     Status: Abnormal    Collection Time: 09/08/22  4:08 AM  Result Value Ref Range    Total hemoglobin 9.6 (L) 12.0 - 16.0 g/dL    O2 Saturation 29.5 %    Carboxyhemoglobin 2.5 (H) 0.5 - 1.5 %    Methemoglobin 1.0 0.0 - 1.5 %  CBC     Status: Abnormal    Collection Time: 09/08/22  4:08 AM  Result Value Ref Range    WBC 24.6 (H) 4.0 - 10.5 K/uL    RBC 2.93 (L) 4.22 - 5.81 MIL/uL    Hemoglobin 9.2 (L) 13.0 - 17.0 g/dL    HCT 62.1 (L) 30.8 - 52.0 %    MCV 100.3 (H) 80.0 - 100.0 fL    MCH 31.4 26.0 - 34.0 pg    MCHC 31.3 30.0 - 36.0 g/dL    RDW 65.7 (H) 84.6 - 15.5 %    Platelets 444 (H) 150 - 400 K/uL    nRBC 0.0 0.0 - 0.2 %  Comprehensive metabolic panel     Status: Abnormal    Collection Time: 09/08/22  4:08 AM  Result Value Ref Range    Sodium 131 (L) 135 - 145 mmol/L    Potassium 3.9 3.5 - 5.1 mmol/L    Chloride 94 (L) 98 - 111 mmol/L    CO2 32 22 - 32 mmol/L    Glucose, Bld 181 (H) 70 - 99 mg/dL    BUN 33 (H) 6 - 20 mg/dL    Creatinine, Ser 9.62 0.61 - 1.24 mg/dL    Calcium 7.7 (L) 8.9 - 10.3 mg/dL    Total Protein 5.4 (L) 6.5 -  8.1 g/dL    Albumin 1.8 (L) 3.5 - 5.0 g/dL    AST 952 (H) 15 - 41 U/L    ALT 103 (H) 0 - 44 U/L    Alkaline Phosphatase 174 (H) 38 - 126 U/L    Total Bilirubin 1.5 (H) 0.3 - 1.2 mg/dL    GFR, Estimated >84 >13 mL/min    Anion gap 5 5 - 15  Magnesium     Status: None    Collection Time: 09/08/22  4:08 AM  Result Value Ref Range    Magnesium 2.1 1.7 - 2.4 mg/dL  CG4 I-STAT (Lactic acid)     Status: None    Collection Time: 09/08/22  4:20 AM  Result Value Ref Range    Lactic Acid, Venous 1.0 0.5 - 1.9  mmol/L  Glucose, capillary     Status: Abnormal    Collection Time: 09/08/22  7:25 AM  Result Value Ref Range    Glucose-Capillary 154 (H) 70 - 99 mg/dL  Gamma GT     Status: Abnormal    Collection Time: 09/08/22  9:29 AM  Result Value Ref Range    GGT 216 (H) 7 - 50 U/L  Vancomycin, random     Status: None    Collection Time: 09/08/22 10:35 AM  Result Value Ref Range    Vancomycin Rm 13 ug/mL  Glucose, capillary     Status: Abnormal    Collection Time: 09/08/22 11:02 AM  Result Value Ref Range    Glucose-Capillary 127 (H) 70 - 99 mg/dL       Imaging Results (Last 48 hours)  DG Chest Port 1 View   Result Date: 09/07/2022 CLINICAL DATA:  LVAD EXAM: PORTABLE CHEST 1 VIEW COMPARISON:  09/06/2022. FINDINGS: Enlarged cardiac silhouette. Mild vascular congestion. Left-sided small pleural effusion. No pneumothorax. There is a right-sided PICC tip overlies mid SVC. Left-sided pacer and left ventricular cyst devices. Left atrial clip. Median sternotomy wires. Feeding tube tip below the diaphragm and off the x-ray. IMPRESSION: Enlarged cardiac silhouette. Mild vascular congestion. Small left-sided pleural effusion. Electronically Signed   By: Layla Maw M.D.   On: 09/07/2022 10:14       Assessment/Plan: Diagnosis: 57 yo male with debility and left sided weakness related acute on chronic heart failure/LVAD placement as well as post-op right MCA infarct Does the need for close, 24  hr/day medical supervision in concert with the patient's rehab needs make it unreasonable for this patient to be served in a less intensive setting? Yes Co-Morbidities requiring supervision/potential complications:  -acute on chronic systolic congestive heart failure -leukocytosis/ID considerations -hx of sarcoid hrt disease -paroxysmal a fib -acute kidney injury -anemia   Due to bladder management, bowel management, safety, skin/wound care, disease management, medication administration, pain management, and patient education, does the patient require 24 hr/day rehab nursing? Potentially Does the patient require coordinated care of a physician, rehab nurse, therapy disciplines of PT, OT, SLP to address physical and functional deficits in the context of the above medical diagnosis(es)? Yes Addressing deficits in the following areas: balance, endurance, locomotion, strength, transferring, bowel/bladder control, bathing, dressing, feeding, grooming, toileting, speech, swallowing, and psychosocial support Can the patient actively participate in an intensive therapy program of at least 3 hrs of therapy per day at least 5 days per week? Potentially The potential for patient to make measurable gains while on inpatient rehab is good Anticipated functional outcomes upon discharge from inpatient rehab are supervision and min assist  with PT, supervision and min assist with OT, modified independent and supervision with SLP. Estimated rehab length of stay to reach the above functional goals is: potentially 18-24 days Anticipated discharge destination: Home Overall Rehab/Functional Prognosis: good   POST ACUTE RECOMMENDATIONS: This patient's condition is appropriate for continued rehabilitative care in the following setting: CIR Patient has agreed to participate in recommended program. Yes Note that insurance prior authorization may be required for reimbursement for recommended care.   Comment: Pt still  has active medical issues requiring ICU care. Also, activity tolerance is very limited. Will need to see improving stamina with therapies. Additionally, we are going to need to confirm that cousin can help and provide the amount of help he's probably going to need at home after discharge. Rehab Admissions Coordinator to follow up.  MEDICAL RECOMMENDATIONS: Pt will need ramping up of his acute therapy intensity to see if he is going to ultimately tolerate inpatient rehab.      I have personally performed a face to face diagnostic evaluation of this patient. Additionally, I have examined the patient's medical record including any pertinent labs and radiographic images. If the physician assistant has documented in this note, I have reviewed and edited or otherwise concur with the physician assistant's documentation.   Thanks,   Ranelle Oyster, MD 09/08/2022          Routing History

## 2022-09-27 NOTE — Progress Notes (Signed)
Inpatient Rehabilitation Admissions Coordinator   I have insurance approval and CIR bed to admit him today. I await medical clearance for d/c today . I contacted [name] and she is aware of CIR admit. She has given home IV antibiotics before and is aware that he will require this after CIR admit when he is discharged home with her. Acute team and TOC made aware.  Ottie Glazier, RN, MSN Rehab Admissions Coordinator [phone] 09/27/2022 12:55 PM

## 2022-09-27 NOTE — TOC Progression Note (Signed)
Transition of Care [facility]) - Progression Note    Patient Details  Name: Charles Holmes MRN: [id number] Date of Birth: [birth year]  [facility] CM/SW Contact  Elliot Cousin, RN Phone Number: [phone] 09/27/2022, 2:46 PM  Clinical Narrative:  Pt scheduled dc to CIR today. Contacted [employer] rep, Jeri Modena RN for Home IV abx after he completes IP rehab. CIR CSW will arrange needed DME and HH at time of his dc.      Expected Discharge Plan: IP Rehab Facility Barriers to Discharge: No Barriers Identified  Expected Discharge Plan and Services   Discharge Planning Services: CM Consult Post Acute Care Choice: IP Rehab Living arrangements for the past 2 months: Single Family Home Expected Discharge Date: 09/27/22                           [facility] Agency: [employer] Date HH Agency Contacted: 09/27/22 Time HH Agency Contacted: 1445 Representative spoke with at [facility] Agency: Jeri Modena RN, Home Infusion   Social Determinants of Health (SDOH) Interventions SDOH Screenings   Food Insecurity: No Food Insecurity (08/13/2022)  Housing: Low Risk  (08/13/2022)  Transportation Needs: No Transportation Needs (08/13/2022)  Utilities: Not At Risk (08/13/2022)  Financial Resource Strain: Low Risk  (07/12/2022)  Tobacco Use: Medium Risk (08/22/2022)    Readmission Risk Interventions     No data to display

## 2022-09-27 NOTE — H&P (Incomplete)
Physical Medicine and Rehabilitation Admission H&P   CC: Functional deficits secondary to heart failure and right MCA stroke  HPI: Charles Holmes is a 57 year old treated at [facility] in 2021 for CAD and heart failure now with LVAD. On the day of admission, he was evaluated at the heart failure outpatient clinic, he was found with worsening heart failure and was referred to the ED for further evaluation. Admitted on 08/10/2022 on HF team service. 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 At baseline, mild asterixis bilaterally and slight diminished fine finger movements on the left.   Underwent R/L cardiac cath showed low cardiac output and filling pressures. PFT/pulmonary medicine evaluation due to severe COPD. Started advanced therapies due to very labile volume status with any decreases in milrinone or diuretics consistent with stage D heart failure. Dental surgery consulted 8/05 and underwent remaining tooth extractions (#13) on 8/06. Back to cath lab and IABP placed on 8/09 for ongoing cardiogenic shock. Head CT performed on 8/17 showed interval evolution of the right MCA territory infarct with decreased intraparenchymal and intra ventricular hemorrhage. No hydrocephalus or midline shift. LVAD (HeartMate 3) placed 8/13. New left sided weakness noted on 8/14 and code stroke activated. Seen by Dr. Derry Lory. CTA with R MCA M1 occlusion. CTP with a core infarct of 12cc and penumbra of 90cc. Code IR activated and patient taken for mechanical thrombectomy. Extubated on 8/15. Oriented and language fluent with mild dysarthria on 8/18. Left facial droop and unable to lift LLE against gravity. Remained on heparin IV. Course complicated by Enterococcus bacteremia on 9/06 and ID  consulted. Will remain on IV antibiotics through 11/04/2022. PO intake not yet optimal. On regular diet. Now on aspirin and coumadin. Delirium persists with hallucinations. The patient requires inpatient medicine and rehabilitation evaluations and services for ongoing dysfunction secondary to heart failure and right MCA stroke.  ROS Past Medical History:  Diagnosis Date   CAD (coronary artery disease)    CHF (congestive heart failure) (HCC)    GERD (gastroesophageal reflux disease)    Hyperlipidemia    Hypertension    Systolic heart failure (HCC) 2021   LVEF 18%, RVEF 38% on cardiac MRI 12/19/2019. possible cardiac sarcoidosis.   Wide-complex tachycardia 2021   wears LifeVest   Past Surgical History:  Procedure Laterality Date   BIV UPGRADE N/A 06/07/2021   Procedure: BIV ICD UPGRADE;  Surgeon: Regan Lemming, MD;  Location: [facility] INVASIVE CV LAB;  Service: Cardiovascular;  Laterality: N/A;   CLIPPING OF ATRIAL APPENDAGE Left 08/29/2022   Procedure: CLIPPING OF ATRIAL APPENDAGE;  Surgeon: Lovett Sox, MD;  Location: [facility] OR;  Service: Open Heart Surgery;  Laterality: Left;   IABP INSERTION N/A 08/25/2022   Procedure: IABP Insertion;  Surgeon: Dolores Patty, MD;  Location: [facility] INVASIVE CV LAB;  Service: Cardiovascular;  Laterality: N/A;   ICD IMPLANT N/A 02/20/2020   Procedure: ICD IMPLANT;  Surgeon: Regan Lemming, MD;  Location: [facility] INVASIVE CV LAB;  Service: Cardiovascular;  Laterality: N/A;   INSERTION OF IMPLANTABLE LEFT VENTRICULAR ASSIST DEVICE N/A 08/29/2022   Procedure: INSERTION OF IMPLANTABLE LEFT VENTRICULAR ASSIST DEVICE;  Surgeon: Lovett Sox, MD;  Location: [facility] OR;  Service: Open Heart Surgery;  Laterality: N/A;   IR CT HEAD LTD  07/10/2022  IR CT HEAD LTD  08/30/2022   IR PERCUTANEOUS ART THROMBECTOMY/INFUSION INTRACRANIAL INC DIAG ANGIO  07/10/2022   IR PERCUTANEOUS ART THROMBECTOMY/INFUSION INTRACRANIAL INC DIAG ANGIO  08/30/2022   IR US GUIDE VASC ACCESS LEFT   08/30/2022   IR US GUIDE VASC ACCESS RIGHT  07/10/2022   RADIOLOGY WITH ANESTHESIA N/A 07/10/2022   Procedure: RADIOLOGY WITH ANESTHESIA;  Surgeon: Radiologist, Medication, MD;  Location: [facility] OR;  Service: Radiology;  Laterality: N/A;   RADIOLOGY WITH ANESTHESIA N/A 08/30/2022   Procedure: IR WITH ANESTHESIA;  Surgeon: Julieanne Cotton, MD;  Location: [facility] OR;  Service: Radiology;  Laterality: N/A;   REMOVAL OF IMPELLA LEFT VENTRICULAR ASSIST DEVICE N/A 08/29/2022   Procedure: REMOVAL OF INTRA-AORTIC BALLON PUMP;  Surgeon: Lovett Sox, MD;  Location: [facility] OR;  Service: Open Heart Surgery;  Laterality: N/A;   RIGHT HEART CATH N/A 07/14/2022   Procedure: RIGHT HEART CATH;  Surgeon: Dolores Patty, MD;  Location: [facility] INVASIVE CV LAB;  Service: Cardiovascular;  Laterality: N/A;   RIGHT HEART CATH N/A 08/25/2022   Procedure: RIGHT HEART CATH;  Surgeon: Dolores Patty, MD;  Location: [facility] INVASIVE CV LAB;  Service: Cardiovascular;  Laterality: N/A;   RIGHT HEART CATH N/A 09/12/2022   Procedure: RIGHT HEART CATH;  Surgeon: Laurey Morale, MD;  Location: [facility] INVASIVE CV LAB;  Service: Cardiovascular;  Laterality: N/A;   RIGHT/LEFT HEART CATH AND CORONARY ANGIOGRAPHY N/A 12/16/2019   Procedure: RIGHT/LEFT HEART CATH AND CORONARY ANGIOGRAPHY;  Surgeon: [place]az[place], Peter M, MD;  Location: [facility] INVASIVE CV LAB;  Service: Cardiovascular;  Laterality: N/A;   RIGHT/LEFT HEART CATH AND CORONARY ANGIOGRAPHY N/A 08/10/2022   Procedure: RIGHT/LEFT HEART CATH AND CORONARY ANGIOGRAPHY;  Surgeon: Dolores Patty, MD;  Location: [facility] INVASIVE CV LAB;  Service: Cardiovascular;  Laterality: N/A;   TEE WITHOUT CARDIOVERSION N/A 08/29/2022   Procedure: TRANSESOPHAGEAL ECHOCARDIOGRAM;  Surgeon: Lovett Sox, MD;  Location: [facility] OR;  Service: Open Heart Surgery;  Laterality: N/A;   TOOTH EXTRACTION N/A 08/22/2022   Procedure: DENTAL RESTORATION/EXTRACTIONS;  Surgeon: Ocie Doyne, DMD;  Location: [facility] OR;  Service: Oral Surgery;   Laterality: N/A;   Family History  Problem Relation Age of Onset   Hypertension Father    Heart disease Father    Heart disease Mother    Social History:  reports that he has quit smoking. His smoking use included cigarettes. He has never used smokeless tobacco. He reports current alcohol use of about 6.0 standard drinks of alcohol per week. He reports current drug use. Drug: Marijuana. Allergies:  Allergies  Allergen Reactions   Amiodarone     Severe tremors   Percocet [Oxycodone-Acetaminophen] Itching   Medications Prior to Admission  Medication Sig Dispense Refill   apixaban (ELIQUIS) 5 MG TABS tablet Take 1 tablet (5 mg total) by mouth 2 (two) times daily. 180 tablet 3   atorvastatin (LIPITOR) 80 MG tablet Take 1 tablet (80 mg total) by mouth daily. 90 tablet 3   carvedilol (COREG) 3.125 MG tablet Take 1 tablet (3.125 mg total) by mouth 2 (two) times daily. 180 tablet 3   dapagliflozin propanediol (FARXIGA) 10 MG TABS tablet Take 1 tablet (10 mg total) by mouth daily before breakfast. 90 tablet 3   dorzolamide-timolol (COSOPT) 22.3-6.8 MG/ML ophthalmic solution Place 1 drop into both eyes in the morning and at bedtime.     fluticasone-salmeterol (ADVAIR HFA) 230-21 MCG/ACT inhaler Inhale 2 puffs into the lungs 2 (two) times daily. 1 each 12  furosemide (LASIX) 40 MG tablet Take 1 tablet (40 mg total) by mouth 2 (two) times daily. 90 tablet 3   latanoprost (XALATAN) 0.005 % ophthalmic solution Place 1 drop into both eyes at bedtime.     methotrexate (RHEUMATREX) 2.5 MG tablet Take 8 tablets (20 mg total) by mouth once a week. (Patient taking differently: Take 20 mg by mouth once a week. Saturdays) 96 tablet 3   mexiletine (MEXITIL) 250 MG capsule Take 1 capsule (250 mg total) by mouth 2 (two) times daily. 180 capsule 3   pantoprazole (PROTONIX) 40 MG tablet Take 40 mg by mouth daily before breakfast.        Home: Home Living Family/patient expects to be discharged to:: Private  residence Living Arrangements: Alone Available Help at Discharge: Family, Available 24 hours/day (cousin and her spouse 24/7; dtr expecting baby in Sept. Her 3rd) Type of Home: Mobile home Home Access: Stairs to enter [employer] of Steps: 1 Entrance Stairs-Rails: Right, Left Home Layout: One level Bathroom Shower/Tub: Health visitor: Standard Bathroom Accessibility: Yes Home Equipment: None Additional Comments: If pt has LVAD he will go stay with cousins initially in 1 level home.   Functional History: Prior Function Prior Level of Function : Independent/Modified Independent, Driving ADLs Comments: does cooking / cleaning  Functional Status:  Mobility: Bed Mobility Overal bed mobility: Needs Assistance Bed Mobility: Rolling, Sidelying to Sit Rolling: Min assist Sidelying to sit: Mod assist, HOB elevated Supine to sit: Mod assist Sit to supine: Min assist General bed mobility comments: min A to elevate feet Transfers Overall transfer level: Needs assistance Equipment used: 1 person hand held assist Transfers: Bed to chair/wheelchair/BSC, Sit to/from Stand Sit to Stand: Min assist Bed to/from chair/wheelchair/BSC transfer type:: Step pivot Step pivot transfers: Min assist Transfer via Lift Equipment: Stedy General transfer comment: pt minA from elevated bed to power up, minA to steady during step pvt transfer to [facility] and then back to EOB. decreased L LE step height but no L knee buckling this date Ambulation/Gait Ambulation/Gait assistance: Mod assist, +2 safety/equipment Gait Distance (Feet): 120 Feet Assistive device: Fara Boros Gait Pattern/deviations: Step-through pattern, Decreased step length - right, Decreased step length - left, Trunk flexed General Gait Details: pt requires minA to help guide eva walker, pt very dependent on Eva walker, limited use of L UE to suport self on regular RW, 2nd person for line management, constant verbal cues to  stand up tall and look forward Gait velocity: decr Gait velocity interpretation: <1.31 ft/sec, indicative of household ambulator Pre-gait activities: w/shifting and low amplitude steps in place during 1 of the standing trials. Stairs assistance: Supervision Stair Management: One rail Left, Alternating pattern, Forwards General stair comments: ascended no rail, descended holding rail with cues    ADL: ADL Overall ADL's : Needs assistance/impaired Eating/Feeding: Sitting, Minimal assistance Grooming: Oral care, Brushing hair, Wash/dry face, Standing, Sitting, Contact guard assist, Minimal assistance Grooming Details (indicate cue type and reason): minA when standing for steadying assist Upper Body Bathing: Moderate assistance, Sitting Lower Body Bathing: Maximal assistance, Sitting/lateral leans Upper Body Dressing : Moderate assistance, Sitting Upper Body Dressing Details (indicate cue type and reason): doffling VAD vest Lower Body Dressing: Moderate assistance, Sitting/lateral leans Lower Body Dressing Details (indicate cue type and reason): donning socks, attempting figure 4 Toilet Transfer: Minimal assistance Toilet Transfer Details (indicate cue type and reason): 1 person HHA Toileting- Clothing Manipulation and Hygiene: Total assistance, Sit to/from stand, +2 for safety/equipment Toileting - Clothing Manipulation  Details (indicate cue type and reason): total A in standing for rear peri care Functional mobility during ADLs: Minimal assistance General ADL Comments: very limited standing tolerance  Cognition: Cognition Overall Cognitive Status: Impaired/Different from baseline Orientation Level: Oriented X4 Cognition Arousal: Alert Behavior During Therapy: WFL for tasks assessed/performed Overall Cognitive Status: Impaired/Different from baseline Area of Impairment: Safety/judgement, Problem solving, Memory Memory: Decreased recall of precautions Following Commands: Follows one  step commands consistently Safety/Judgement: Decreased awareness of deficits Awareness: Emergent Problem Solving: Slow processing General Comments: min cues for sternal prec this session,  Physical Exam: Blood pressure 129/62, pulse (!) 153, temperature 98 F (36.7 C), temperature source Oral, resp. rate 19, height 5\' 7"  (1.702 m), weight 79.4 kg, SpO2 93%. Physical Exam  Results for orders placed or performed during the hospital encounter of 08/10/22 (from the past 48 hour(s))  Glucose, capillary     Status: Abnormal   Collection Time: 09/25/22  3:52 PM  Result Value Ref Range   Glucose-Capillary 106 (H) 70 - 99 mg/dL    Comment: Glucose reference range applies only to samples taken after fasting for at least 8 hours.  Glucose, capillary     Status: None   Collection Time: 09/25/22 10:13 PM  Result Value Ref Range   Glucose-Capillary 72 70 - 99 mg/dL    Comment: Glucose reference range applies only to samples taken after fasting for at least 8 hours.  Brain natriuretic peptide     Status: Abnormal   Collection Time: 09/25/22 11:51 PM  Result Value Ref Range   B Natriuretic Peptide 851.4 (H) 0.0 - 100.0 pg/mL    Comment: Performed at [facility], [address], [place], [place] [address]  Lactate dehydrogenase     Status: Abnormal   Collection Time: 09/26/22  2:23 AM  Result Value Ref Range   LDH 251 (H) 98 - 192 U/L    Comment: Performed at [facility], [address], [place], [place] [address]  Protime-INR     Status: Abnormal   Collection Time: 09/26/22  2:23 AM  Result Value Ref Range   Prothrombin Time 28.1 (H) 11.4 - 15.2 seconds   INR 2.6 (H) 0.8 - 1.2    Comment: (NOTE) INR goal varies based on device and disease states. Performed at [facility], [address], [place], [place] [address]   Magnesium     Status: None   Collection Time: 09/26/22  2:23 AM  Result Value Ref Range   Magnesium 1.8 1.7 - 2.4 mg/dL    Comment: Performed at [facility], [address], [place], [place] [address]  Basic metabolic panel     Status: Abnormal   Collection Time: 09/26/22  2:23 AM  Result Value Ref Range   Sodium 137 135 - 145 mmol/L   Potassium 3.7 3.5 - 5.1 mmol/L   Chloride 99 98 - 111 mmol/L   CO2 27 22 - 32 mmol/L   Glucose, Bld 77 70 - 99 mg/dL    Comment: Glucose reference range applies only to samples taken after fasting for at least 8 hours.   BUN 12 6 - 20 mg/dL   Creatinine, Ser 2.95 0.61 - 1.24 mg/dL   Calcium 8.5 (L) 8.9 - 10.3 mg/dL   GFR, Estimated >62 >13 mL/min    Comment: (NOTE) Calculated using the CKD-EPI Creatinine Equation (2021)    Anion gap 11 5 - 15    Comment: Performed at [facility], [address], [place], [place] [address]  CBC     Status: Abnormal   Collection Time: 09/26/22  2:23 AM  Result Value Ref Range   WBC 9.1 4.0 - 10.5 K/uL   RBC 3.31 (L) 4.22 - 5.81 MIL/uL   Hemoglobin 9.2 (L) 13.0 - 17.0 g/dL   HCT 60.4 (L) 54.0 - 98.1 %   MCV 92.1 80.0 - 100.0 fL   MCH 27.8 26.0 - 34.0 pg   MCHC 30.2 30.0 - 36.0 g/dL   RDW 19.1 (H) 47.8 - 29.5 %   Platelets 283 150 - 400 K/uL   nRBC 0.0 0.0 - 0.2 %    Comment: Performed at [facility], [address] [place], [place] [address]  Glucose, capillary     Status: None   Collection Time: 09/26/22  6:24 AM  Result Value Ref Range   Glucose-Capillary 83 70 - 99 mg/dL    Comment: Glucose reference range applies only to samples taken after fasting for at least 8 hours.  Glucose, capillary     Status: Abnormal   Collection Time: 09/26/22 11:26 AM  Result Value Ref Range   Glucose-Capillary 126 (H) 70 - 99 mg/dL    Comment: Glucose reference range applies only to samples taken after fasting for at least 8 hours.  Glucose, capillary     Status: Abnormal   Collection Time: 09/26/22  4:15 PM  Result Value Ref Range   Glucose-Capillary 103 (H) 70 - 99 mg/dL    Comment: Glucose reference range applies only to samples taken after fasting  for at least 8 hours.  Glucose, capillary     Status: Abnormal   Collection Time: 09/26/22  9:54 PM  Result Value Ref Range   Glucose-Capillary 120 (H) 70 - 99 mg/dL    Comment: Glucose reference range applies only to samples taken after fasting for at least 8 hours.   Comment 1 Notify RN    Comment 2 Document in Chart   Lactate dehydrogenase     Status: Abnormal   Collection Time: 09/27/22  2:26 AM  Result Value Ref Range   LDH 252 (H) 98 - 192 U/L    Comment: Performed at [facility], [address] [place], [place] [address]  Protime-INR     Status: Abnormal   Collection Time: 09/27/22  2:26 AM  Result Value Ref Range   Prothrombin Time 28.4 (H) 11.4 - 15.2 seconds   INR 2.6 (H) 0.8 - 1.2    Comment: (NOTE) INR goal varies based on device and disease states. Performed at [facility], [address], [place], [place] [address]   Magnesium     Status: None   Collection Time: 09/27/22  2:26 AM  Result Value Ref Range   Magnesium 2.0 1.7 - 2.4 mg/dL    Comment: Performed at [facility], [address], [place], [place] [address]  CBC     Status: Abnormal   Collection Time: 09/27/22  2:26 AM  Result Value Ref Range   WBC 9.7 4.0 - 10.5 K/uL   RBC 3.46 (L) 4.22 - 5.81 MIL/uL   Hemoglobin 9.5 (L) 13.0 - 17.0 g/dL   HCT 13.2 (L) 44.0 - 10.2 %   MCV 94.2 80.0 - 100.0 fL   MCH 27.5 26.0 - 34.0 pg   MCHC 29.1 (L) 30.0 - 36.0 g/dL   RDW 72.5 (H) 36.6 - 44.0 %   Platelets  307 150 - 400 K/uL   nRBC 0.0 0.0 - 0.2 %    Comment: Performed at [facility], [address] [place], [place] [address]  Basic metabolic panel     Status: Abnormal   Collection Time: 09/27/22  2:26 AM  Result Value Ref Range   Sodium 139 135 - 145 mmol/L   Potassium 4.1 3.5 - 5.1 mmol/L   Chloride 102 98 - 111 mmol/L   CO2 25 22 - 32 mmol/L   Glucose, Bld 113 (H) 70 - 99 mg/dL    Comment: Glucose reference range applies only to samples taken after fasting for at least 8 hours.    BUN 12 6 - 20 mg/dL   Creatinine, Ser 1.91 0.61 - 1.24 mg/dL   Calcium 8.5 (L) 8.9 - 10.3 mg/dL   GFR, Estimated >47 >82 mL/min    Comment: (NOTE) Calculated using the CKD-EPI Creatinine Equation (2021)    Anion gap 12 5 - 15    Comment: Performed at [facility], [address] [place], [place] [address]  Glucose, capillary     Status: None   Collection Time: 09/27/22  6:37 AM  Result Value Ref Range   Glucose-Capillary 85 70 - 99 mg/dL    Comment: Glucose reference range applies only to samples taken after fasting for at least 8 hours.   Comment 1 Notify RN    Comment 2 Document in Chart   Glucose, capillary     Status: None   Collection Time: 09/27/22 11:37 AM  Result Value Ref Range   Glucose-Capillary 79 70 - 99 mg/dL    Comment: Glucose reference range applies only to samples taken after fasting for at least 8 hours.   ECHOCARDIOGRAM COMPLETE  Result Date: 09/25/2022    ECHOCARDIOGRAM REPORT   Patient Name:   Charles Holmes Date of Exam: 09/25/2022 Medical Rec #:  [id number]       Height:       67.0 in Accession #:    [id number]      Weight:       176.6 lb Date of Birth:  [birth year]       BSA:          1.918 m Patient Age:    57 years        BP:           87/76 mmHg Patient Gender: M               HR:           77 bpm. Exam Location:  Inpatient Procedure: 2D Echo, Color Doppler and Cardiac Doppler Indications:    Bacteremia R78.81  History:        Patient has prior history of Echocardiogram examinations, most                 recent 09/20/2022. CHF, CAD; Risk Factors:Hypertension and                 Dyslipidemia.  Sonographer:    Harriette Bouillon RDCS Referring Phys: [id number] BRITTAINY M SIMMONS IMPRESSIONS  1. Poor acoustic windows. Left ventricular ejection fraction, by estimation, is <20%. The left ventricle has severely decreased function. The left ventricle demonstrates global hypokinesis. The left ventricular internal cavity size was severely dilated.  Indeterminate diastolic filling  due to E-A fusion.  2. Right ventricular systolic function is severely reduced. The right ventricular size is normal.  3. The mitral valve was not  well visualized. No evidence of mitral valve regurgitation.  4. Aortic valve regurgitation is not visualized. Conclusion(s)/Recommendation(s): No evidence of valvular vegetations on this transthoracic echocardiogram. Consider a transesophageal echocardiogram to exclude infective endocarditis if clinically indicated. FINDINGS  Left Ventricle: Poor acoustic windows. Left ventricular ejection fraction, by estimation, is <20%. The left ventricle has severely decreased function. The left ventricle demonstrates global hypokinesis. The left ventricular internal cavity size was severely dilated. There is no left ventricular hypertrophy. Indeterminate diastolic filling due to E-A fusion. Right Ventricle: The right ventricular size is normal. Right ventricular systolic function is severely reduced. Pericardium: There is no evidence of pericardial effusion. Mitral Valve: The mitral valve was not well visualized. No evidence of mitral valve regurgitation. Tricuspid Valve: Tricuspid valve regurgitation is mild. Aortic Valve: Aortic valve regurgitation is not visualized. Pulmonic Valve: The pulmonic valve was not well visualized. Aorta: The aortic root and ascending aorta are structurally normal, with no evidence of dilitation. Additional Comments: A device lead is visualized.  LEFT VENTRICLE PLAX 2D LVIDd:         5.90 cm LVIDs:         5.80 cm LV PW:         0.60 cm LV IVS:        0.60 cm LVOT diam:     2.30 cm LVOT Area:     4.15 cm  RIGHT VENTRICLE RV S prime:     5.77 cm/s TAPSE (M-mode): 0.8 cm LEFT ATRIUM         Index LA diam:    4.40 cm 2.29 cm/m   AORTA Ao Root diam: 3.30 cm Ao Asc diam:  3.20 cm MITRAL VALVE               TRICUSPID VALVE MV Area (PHT): 5.50 cm    TR Peak grad:   24.0 mmHg MV E velocity: 49.30 cm/s  TR Vmax:        245.00 cm/s MV A velocity: 62.10 cm/s MV  E/A ratio:  0.79        SHUNTS                            Systemic Diam: 2.30 cm Carolan Clines Electronically signed by Carolan Clines Signature Date/Time: 09/25/2022/5:40:37 PM    Final       Blood pressure 129/62, pulse (!) 153, temperature 98 F (36.7 C), temperature source Oral, resp. rate 19, height 5\' 7"  (1.702 m), weight 79.4 kg, SpO2 93%.  Medical Problem List and Plan: 1. Functional deficits secondary to ***  -patient may *** shower  -ELOS/Goals: ***  2.  Antithrombotics: -DVT/anticoagulation:  Pharmaceutical: Coumadin  -antiplatelet therapy: Aspirin 81 mg daily  3. Pain Management: Tylenol as needed  4. Mood/Behavior/Sleep: delirium/insomnia - LCSW to evaluate and provide emotional support  -continue mirtazapine 7.5 mg q HS  -continue sertraline 50 mg daily  -continue melatonin 3 mg q HS  -antipsychotic agents: n/a  5. Neuropsych/cognition: This patient *** capable of making decisions on *** own behalf.  6. Skin/Wound Care: Routine skin care checks   7. Fluids/Electrolytes/Nutrition: strict Is and Os and follow-up chemistries  -poor PO intake on Marinol  -continue supplements  8: Hypertension: monitor TID and prn  9: Hyperlipidemia: continue statin  10: Enterococcus bacteremia: 6 weeks IV antibiotic  -follow-up with ID  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: Home meds: Coreg 3.125, Lasix 40mg , Lostartan 25mg , Aldactone 12.5, Farxiga 10mg , mexilitene   12: Acute  on chronic systolic heart failure with cardiogenic shock s/p LVAD  -daily weight  -continue digoxin 0.125 mg daily  -continue mexiletine 250 mg BID  -continue midodrine 5 mg q 8 hours  -continue sildenafil 20 mg q 8 hours  -continue warfarin per pharmacy  13: Hx of VT: continue mexiletine, digoxin; has ICD  14: DM-2: A1c = 5.5%; monitor PO intake; DC SSI (Farxiga at home)  15: OSA: needs outpatient sleep study  16: Anemia, iron deficiency: follow-up CBC  17: Severe COPD:   -continue Ellipta one puff  daily  -continue   18: s/p tooth extractions this admission: monitor PO tolerance  19: Glaucoma: continue Cosopt and Xalatan  ***  Milinda Antis, PA-C 09/27/2022

## 2022-09-27 NOTE — TOC Benefit Eligibility Note (Signed)
Patient Product/process development scientist completed.    The patient is insured through [facility]. Patient has Medicare and is not eligible for a copay card, but may be able to apply for patient assistance, if available.    Ran test claim for sildenafil (Revatio) 20 mg tablet and Requires Prior Authorization  This test claim was processed through [employer]- copay amounts may vary at other pharmacies due to [employer], or as the patient moves through the different stages of their insurance plan.     Roland Earl, CPHT Pharmacy Technician III Certified Patient Advocate [facility] Patient Advocate Team Direct Number: [phone]  Fax: [phone]

## 2022-09-27 NOTE — Evaluation (Signed)
Clinical/Bedside Swallow Evaluation Patient Details  Name: Charles Holmes MRN: [id number] Date of Birth: Aug 09, 1965  Today's Date: 09/27/2022 Time: SLP Start Time (ACUTE ONLY): 0900 SLP Stop Time (ACUTE ONLY): 0915 SLP Time Calculation (min) (ACUTE ONLY): 15 min  Past Medical History:  Past Medical History:  Diagnosis Date   CAD (coronary artery disease)    CHF (congestive heart failure) (HCC)    GERD (gastroesophageal reflux disease)    Hyperlipidemia    Hypertension    Systolic heart failure (HCC) 2021   LVEF 18%, RVEF 38% on cardiac MRI 12/19/2019. possible cardiac sarcoidosis.   Wide-complex tachycardia 2021   wears LifeVest   Past Surgical History:  Past Surgical History:  Procedure Laterality Date   BIV UPGRADE N/A 06/07/2021   Procedure: BIV ICD UPGRADE;  Surgeon: Regan Lemming, MD;  Location: [facility] INVASIVE CV LAB;  Service: Cardiovascular;  Laterality: N/A;   CLIPPING OF ATRIAL APPENDAGE Left 08/29/2022   Procedure: CLIPPING OF ATRIAL APPENDAGE;  Surgeon: Lovett Sox, MD;  Location: [facility] OR;  Service: Open Heart Surgery;  Laterality: Left;   IABP INSERTION N/A 08/25/2022   Procedure: IABP Insertion;  Surgeon: Dolores Patty, MD;  Location: [facility] INVASIVE CV LAB;  Service: Cardiovascular;  Laterality: N/A;   ICD IMPLANT N/A 02/20/2020   Procedure: ICD IMPLANT;  Surgeon: Regan Lemming, MD;  Location: [facility] INVASIVE CV LAB;  Service: Cardiovascular;  Laterality: N/A;   INSERTION OF IMPLANTABLE LEFT VENTRICULAR ASSIST DEVICE N/A 08/29/2022   Procedure: INSERTION OF IMPLANTABLE LEFT VENTRICULAR ASSIST DEVICE;  Surgeon: Lovett Sox, MD;  Location: [facility] OR;  Service: Open Heart Surgery;  Laterality: N/A;   IR CT HEAD LTD  07/10/2022   IR CT HEAD LTD  08/30/2022   IR PERCUTANEOUS ART THROMBECTOMY/INFUSION INTRACRANIAL INC DIAG ANGIO  07/10/2022   IR PERCUTANEOUS ART THROMBECTOMY/INFUSION INTRACRANIAL INC DIAG ANGIO  08/30/2022   IR US GUIDE VASC ACCESS LEFT  08/30/2022   IR  US GUIDE VASC ACCESS RIGHT  07/10/2022   RADIOLOGY WITH ANESTHESIA N/A 07/10/2022   Procedure: RADIOLOGY WITH ANESTHESIA;  Surgeon: Radiologist, Medication, MD;  Location: [facility] OR;  Service: Radiology;  Laterality: N/A;   RADIOLOGY WITH ANESTHESIA N/A 08/30/2022   Procedure: IR WITH ANESTHESIA;  Surgeon: Julieanne Cotton, MD;  Location: [facility] OR;  Service: Radiology;  Laterality: N/A;   REMOVAL OF IMPELLA LEFT VENTRICULAR ASSIST DEVICE N/A 08/29/2022   Procedure: REMOVAL OF INTRA-AORTIC BALLON PUMP;  Surgeon: Lovett Sox, MD;  Location: [facility] OR;  Service: Open Heart Surgery;  Laterality: N/A;   RIGHT HEART CATH N/A 07/14/2022   Procedure: RIGHT HEART CATH;  Surgeon: Dolores Patty, MD;  [facility]: [facility] INVASIVE CV LAB;  Service: Cardiovascular;  Laterality: N/A;   RIGHT HEART CATH N/A 08/25/2022   Procedure: RIGHT HEART CATH;  Surgeon: Dolores Patty, MD;  Location: [facility] INVASIVE CV LAB;  Service: Cardiovascular;  Laterality: N/A;   RIGHT HEART CATH N/A 09/12/2022   Procedure: RIGHT HEART CATH;  Surgeon: Laurey Morale, MD;  Location: [facility] INVASIVE CV LAB;  Service: Cardiovascular;  Laterality: N/A;   RIGHT/LEFT HEART CATH AND CORONARY ANGIOGRAPHY N/A 12/16/2019   Procedure: RIGHT/LEFT HEART CATH AND CORONARY ANGIOGRAPHY;  Surgeon: [place], Peter M, MD;  Location: [facility] INVASIVE CV LAB;  Service: Cardiovascular;  Laterality: N/A;   RIGHT/LEFT HEART CATH AND CORONARY ANGIOGRAPHY N/A 08/10/2022   Procedure: RIGHT/LEFT HEART CATH AND CORONARY ANGIOGRAPHY;  Surgeon: Dolores Patty, MD;  Location: [facility] INVASIVE CV LAB;  Service: Cardiovascular;  Laterality: N/A;   TEE WITHOUT CARDIOVERSION N/A 08/29/2022   Procedure: TRANSESOPHAGEAL ECHOCARDIOGRAM;  Surgeon: Lovett Sox, MD;  Location: [facility] OR;  Service: Open Heart Surgery;  Laterality: N/A;   TOOTH EXTRACTION N/A 08/22/2022   Procedure: DENTAL RESTORATION/EXTRACTIONS;  Surgeon: Ocie Doyne, DMD;  Location: [facility] OR;  Service: Oral Surgery;  Laterality: N/A;    HPI:  Charles Holmes is 57 year old presented to [facility] on 08/10/22 for acute on chronic systolic heart failure. Multiple dental extractions 8/6. Pt had IABP placed on 08/25/22 for ongoing cardiogenic shock. Underwent placement of LVAD on 8/13. Developed lt sided weakness on 8/14. Rt MCA CVA. Underwent mechanical thrombectomy by IR. ETT 8/13-15.  PMH - CAD, CHF, HLD, HTN, systolic HF, ICD implant, rt CVA.  LVAD placement and new stroke R MCA M1. SLP evaluated patient at bedside and via FEES with recommendation for soft solids, thin liquids. SLP reordered 9/10 secondary to patient wishing to upgrade solids.    Assessment / Plan / Recommendation  Clinical Impression  SLP assessed patient's swallow function at bedside via thin liquids and regular/mechanical soft solids. Patient able to masticate graham cracker without difficulty despite being edentulous and with recent dental extractions. He denied any pain, discomfort when chewing. He told SLP that he wanted to be able foods he knows he can tolerate but currently he is hindered by restriction of Dys 2(minced) solids. Patient appears cognitively able to make good choices in regards to finding soft solids within the context of the regular texture solids menu options. He is not at a high risk of aspiration. SLP in agreement to advance solids to regular and patient will order foods himself. No further SLP intervention needed. SLP Visit Diagnosis: Dysphagia, oral phase (R13.11)    Aspiration Risk  No limitations    Diet Recommendation Regular;Thin liquid    Liquid Administration via: Cup;Straw Medication Administration: Whole meds with liquid Supervision: Patient able to self feed Compensations: Slow rate;Small sips/bites Postural Changes: Seated upright at 90 degrees    Other  Recommendations Oral Care Recommendations: Oral care BID    Recommendations for follow up therapy are one component of a multi-disciplinary discharge planning process, led by the  attending physician.  Recommendations may be updated based on patient status, additional functional criteria and insurance authorization.  Follow up Recommendations No SLP follow up      Assistance Recommended at Discharge    Functional Status Assessment Patient has not had a recent decline in their functional status  Frequency and Duration   N/A         Prognosis   N/A     Swallow Study   General Date of Onset: 09/26/22 HPI: Charles Holmes is 57 year old presented to [facility] on 08/10/22 for acute on chronic systolic heart failure. Multiple dental extractions 8/6. Pt had IABP placed on 08/25/22 for ongoing cardiogenic shock. Underwent placement of LVAD on 8/13. Developed lt sided weakness on 8/14. Rt MCA CVA. Underwent mechanical thrombectomy by IR. ETT 8/13-15.  PMH - CAD, CHF, HLD, HTN, systolic HF, ICD implant, rt CVA.  LVAD placement and new stroke R MCA M1. SLP evaluated patient at bedside and via FEES with recommendation for soft solids, thin liquids. SLP reordered 9/10 secondary to patient wishing to upgrade solids. Type of Study: Bedside Swallow Evaluation Previous Swallow Assessment: FEES 09/02/2022 Diet Prior to this Study: Dysphagia 2 (finely chopped);Thin liquids (Level 0) Temperature Spikes Noted: No Respiratory Status: Room air History of Recent Intubation: Yes Total duration of  intubation (days): 2 days Date extubated: 08/31/22 Behavior/Cognition: Alert;Cooperative;Pleasant mood Oral Cavity Assessment: Within Functional Limits Oral Care Completed by SLP: No Oral Cavity - Dentition: Edentulous Vision: Functional for self-feeding Self-Feeding Abilities: Able to feed self Patient Positioning: Upright in bed Baseline Vocal Quality: Normal Volitional Cough: Strong Volitional Swallow: Able to elicit    Oral/Motor/Sensory Function Overall Oral Motor/Sensory Function: Mild impairment Facial ROM: Reduced left Facial Symmetry: Abnormal symmetry left Facial Strength: Reduced  left Facial Sensation: Within Functional Limits Lingual ROM: Within Functional Limits Lingual Symmetry: Within Functional Limits Lingual Strength: Within Functional Limits Lingual Sensation: Within Functional Limits Velum: Within Functional Limits Mandible: Within Functional Limits   Ice Chips     Thin Liquid Thin Liquid: Within functional limits Presentation: Straw;Self Fed    Nectar Thick     Honey Thick     Puree     Solid     Solid: Within functional limits Presentation: Self Fed     Angela Nevin, MA, CCC-SLP Speech Therapy

## 2022-09-27 NOTE — Progress Notes (Signed)
15 Days Post-Op Procedure(s) (LRB): RIGHT HEART CATH (N/A) Subjective: Patient without complaints States he walked with physical therapy yesterday The patient is pleased his diet has been changed to regular which will hopefully improve his nutritional intake  ID has recommended total 6 weeks of IV antibiotics for the recent blood culture positive for enterococcal bacteremia.  He has completed about 1 week of his therapy.  He will probably need a PICC line placed later to complete home IV antibiotics.  Surgical incisions all look fine. VAD parameters now satisfactory. VAD flow 4.2 L/min with satisfactory MAP Objective: Vital signs in last 24 hours: Temp:  [98 F (36.7 C)-98.6 F (37 C)] 98 F (36.7 C) (09/11 1116) Pulse Rate:  [30-153] 153 (09/11 1116) Cardiac Rhythm: Ventricular paced (09/11 0725) Resp:  [16-20] 19 (09/11 1116) BP: (94-129)/(62-88) 129/62 (09/11 1116) SpO2:  [89 %-96 %] 93 % (09/11 1116) Weight:  [79.4 kg] 79.4 kg (09/11 0600)  Hemodynamic parameters for last 24 hours:  Sinus rhythm, afebrile  Intake/Output from previous day: 09/10 0701 - 09/11 0700 In: 1630.1 [P.O.:360; IV Piggyback:1270.1] Out: 1050 [Urine:1050] Intake/Output this shift: No intake/output data recorded.  Exam Alert and comfortable Sternal incision clean and dry Lungs clear Normal VAD hum Abdomen soft Extremities warm with minimal edema Mild left-sided weakness gradual improvement following CVA  Lab Results: Recent Labs    09/26/22 0223 09/27/22 0226  WBC 9.1 9.7  HGB 9.2* 9.5*  HCT 30.5* 32.6*  PLT 283 307   BMET:  Recent Labs    09/26/22 0223 09/27/22 0226  NA 137 139  K 3.7 4.1  CL 99 102  CO2 27 25  GLUCOSE 77 113*  BUN 12 12  CREATININE 0.67 0.70  CALCIUM 8.5* 8.5*    PT/INR:  Recent Labs    09/27/22 0226  LABPROT 28.4*  INR 2.6*   ABG    Component Value Date/Time   PHART 7.365 08/31/2022 1504   HCO3 33.2 (H) 09/12/2022 1108   HCO3 31.2 (H)  09/12/2022 1108   TCO2 35 (H) 09/12/2022 1108   TCO2 33 (H) 09/12/2022 1108   ACIDBASEDEF 4.0 (H) 08/31/2022 1504   O2SAT 66.9 09/16/2022 0539   CBG (last 3)  Recent Labs    09/26/22 2154 09/27/22 0637 09/27/22 1137  GLUCAP 120* 85 79    Assessment/Plan: S/P Procedure(s) (LRB): RIGHT HEART CATH (N/A) Patient ready for CIR Follow CBGs now that his diet has been changed   LOS: 48 days    Lovett Sox 09/27/2022

## 2022-09-27 NOTE — Progress Notes (Addendum)
ANTICOAGULATION CONSULT NOTE  Pharmacy Consult for heparin >warfarin Indication:  LVAD  Allergies  Allergen Reactions   Amiodarone     Severe tremors   Percocet [Oxycodone-Acetaminophen] Itching    Patient Measurements: Height: 5\' 7"  (170.2 cm) Weight: 79.4 kg (175 lb 0.7 oz) IBW/kg (Calculated) : 66.1 Heparin Dosing Weight: 87kg  Vital Signs: Temp: 98 F (36.7 C) (09/11 1116) Temp Source: Oral (09/11 1116) BP: 129/62 (09/11 1116) Pulse Rate: 153 (09/11 1116)  Labs: Recent Labs    09/25/22 0220 09/26/22 0223 09/27/22 0226  HGB 9.5* 9.2* 9.5*  HCT 32.0* 30.5* 32.6*  PLT 286 283 307  LABPROT 25.9* 28.1* 28.4*  INR 2.3* 2.6* 2.6*  CREATININE 0.65 0.67 0.70    Estimated Creatinine Clearance: 100.4 mL/min (by C-G formula based on SCr of 0.7 mg/dL).   Medical History: Past Medical History:  Diagnosis Date   CAD (coronary artery disease)    CHF (congestive heart failure) (HCC)    GERD (gastroesophageal reflux disease)    Hyperlipidemia    Hypertension    Systolic heart failure (HCC) 2021   LVEF 18%, RVEF 38% on cardiac MRI 12/19/2019. possible cardiac sarcoidosis.   Wide-complex tachycardia 2021   wears LifeVest     Assessment: 57yoM on apixaban PTA for hx AF admitted for LVAD workup. Pt s/p HM3 implant on 8/13 c/b acute CVA postop. Started low dose heparin + warfarin started 8/19. Low-dose heparin was stopped 8/31.  INR remains supratherapeutic at 2.6 today, with no changes from the INR from yesterday. Hgb stable at 9.5, plt 286. LDH stable at 252. No s/sx of bleeding. Oral intake is improving, 50% of scheduled meals were consumed yesterday. Diet was adjusted to help improve appetite.    Goal of Therapy:  INR 2-2.5 Monitor platelets by anticoagulation protocol: Yes   Plan:  Warfarin 0.5 mg tonight, low threshold to hold dose tomorrow if INR doesn't decrease  Continue marinol and remeron for now  Daily INR, CBC  Thank you for allowing pharmacy to  participate in this patient's care,  Wilmer Floor, PharmD PGY2 Cardiology Pharmacy Resident 09/27/2022 1:27 PM  Please check AMION for all [facility] Pharmacy phone numbers After 10:00 PM, call Main Pharmacy [phone]

## 2022-09-27 NOTE — Plan of Care (Signed)
  Problem: Consults Goal: RH STROKE PATIENT EDUCATION Description: See Patient Education module for education specifics  Outcome: Progressing   Problem: RH BOWEL ELIMINATION Goal: RH STG MANAGE BOWEL W/MEDICATION W/ASSISTANCE Description: STG Manage Bowel with Medication with min Assistance. Outcome: Progressing   Problem: RH BLADDER ELIMINATION Goal: RH STG MANAGE BLADDER WITH ASSISTANCE Description: STG Manage Bladder With min Assistance Outcome: Progressing   Problem: RH SKIN INTEGRITY Goal: RH STG SKIN FREE OF INFECTION/BREAKDOWN Description: Incisions and skin tears will continue to heal and be free of infection/breakdown with min assist  Outcome: Progressing Goal: RH STG MAINTAIN SKIN INTEGRITY WITH ASSISTANCE Description: STG Maintain Skin Integrity With min Assistance. Outcome: Progressing Goal: RH STG ABLE TO PERFORM INCISION/WOUND CARE W/ASSISTANCE Description: STG Able To Perform Incision/Wound Care With min Assistance. Outcome: Progressing   Problem: RH SAFETY Goal: RH STG ADHERE TO SAFETY PRECAUTIONS W/ASSISTANCE/DEVICE Description: STG Adhere to Safety Precautions With cueing Assistance/Device. Outcome: Progressing Goal: RH STG DECREASED RISK OF FALL WITH ASSISTANCE Description: STG Decreased Risk of Fall With cueing Assistance. Outcome: Progressing   Problem: RH COGNITION-NURSING Goal: RH STG ANTICIPATES NEEDS/CALLS FOR ASSIST W/ASSIST/CUES Description: STG Anticipates Needs/Calls for Assist With cueing Assistance/Cues. Outcome: Progressing   Problem: RH PAIN MANAGEMENT Goal: RH STG PAIN MANAGED AT OR BELOW PT'S PAIN GOAL Description: Less than 4 with PRN medications min assist  Outcome: Progressing   Problem: RH KNOWLEDGE DEFICIT Goal: RH STG INCREASE KNOWLEGDE OF HYPERLIPIDEMIA Description: Patient will be able to manage cholesterol medication and improve cholesterol levels from diet/lifestyle modifications from nursing education and nursing handouts  independently  Outcome: Progressing Goal: RH STG INCREASE KNOWLEDGE OF STROKE PROPHYLAXIS Description: Patient/caregiver will be able to manage stroke medications from nursing education and nursing handouts independently  Outcome: Progressing   Problem: Education: Goal: Understanding of CV disease, CV risk reduction, and recovery process will improve Outcome: Progressing Goal: Individualized Educational Video(s) Outcome: Progressing   Problem: Activity: Goal: Ability to return to baseline activity level will improve Outcome: Progressing   Problem: Cardiovascular: Goal: Ability to achieve and maintain adequate cardiovascular perfusion will improve Outcome: Progressing Goal: Vascular access site(s) Level 0-1 will be maintained Outcome: Progressing   Problem: Health Behavior/Discharge Planning: Goal: Ability to safely manage health-related needs after discharge will improve Outcome: Progressing   Problem: Education: Goal: Knowledge of disease or condition will improve Outcome: Progressing Goal: Knowledge of secondary prevention will improve (MUST DOCUMENT ALL) Outcome: Progressing Goal: Knowledge of patient specific risk factors will improve Loraine Leriche N/A or DELETE if not current risk factor) Outcome: Progressing   Problem: Ischemic Stroke/TIA Tissue Perfusion: Goal: Complications of ischemic stroke/TIA will be minimized Outcome: Progressing   Problem: Coping: Goal: Will verbalize positive feelings about self Outcome: Progressing Goal: Will identify appropriate support needs Outcome: Progressing   Problem: Health Behavior/Discharge Planning: Goal: Ability to manage health-related needs will improve Outcome: Progressing Goal: Goals will be collaboratively established with patient/family Outcome: Progressing   Problem: Self-Care: Goal: Ability to participate in self-care as condition permits will improve Outcome: Progressing Goal: Verbalization of feelings and concerns  over difficulty with self-care will improve Outcome: Progressing Goal: Ability to communicate needs accurately will improve Outcome: Progressing   Problem: Nutrition: Goal: Risk of aspiration will decrease Outcome: Progressing Goal: Dietary intake will improve Outcome: Progressing

## 2022-09-27 NOTE — H&P (Signed)
Physical Medicine and Rehabilitation Admission H&P   CC: Functional deficits secondary to heart failure and right MCA stroke  HPI: Charles Holmes is a 57 year old treated at [facility] in 2021 for CAD and heart failure now with LVAD. On the day of admission, he was evaluated at the heart failure outpatient clinic, he was found with worsening heart failure and was referred to the ED for further evaluation. Admitted on 08/10/2022 on HF team service. 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 At baseline, mild asterixis bilaterally and slight diminished fine finger movements on the left.   Underwent R/L cardiac cath showed low cardiac output and filling pressures. PFT/pulmonary medicine evaluation due to severe COPD. Started advanced therapies due to very labile volume status with any decreases in milrinone or diuretics consistent with stage D heart failure. Dental surgery consulted 8/05 and underwent remaining tooth extractions (#13) on 8/06. Back to cath lab and IABP placed on 8/09 for ongoing cardiogenic shock. Head CT performed on 8/17 showed interval evolution of the right MCA territory infarct with decreased intraparenchymal and intra ventricular hemorrhage. No hydrocephalus or midline shift. LVAD (HeartMate 3) placed 8/13. New left sided weakness noted on 8/14 and code stroke activated. Seen by Dr. Derry Lory. CTA with R MCA M1 occlusion. CTP with a core infarct of 12cc and penumbra of 90cc. Code IR activated and patient taken for mechanical thrombectomy. Extubated on 8/15. Oriented and language fluent with mild dysarthria on 8/18. Left facial droop and unable to lift LLE against gravity. Remained on heparin IV. Course complicated by Enterococcus bacteremia on 9/06 and ID  consulted. Will remain on IV antibiotics through 11/04/2022. PO intake not yet optimal. On regular diet. Now on aspirin and coumadin. Delirium persists with hallucinations. The patient requires inpatient medicine and rehabilitation evaluations and services for ongoing dysfunction secondary to heart failure and right MCA stroke.  Review of Systems  Neurological:  Positive for focal weakness.    Past Medical History:  Diagnosis Date   CAD (coronary artery disease)    CHF (congestive heart failure) (HCC)    COPD (chronic obstructive pulmonary disease) (HCC)    GERD (gastroesophageal reflux disease)    Hyperlipidemia    Hypertension    LVAD (left ventricular assist device) present (HCC)    Stroke (HCC)    Systolic heart failure (HCC) 2021   LVEF 18%, RVEF 38% on cardiac MRI 12/19/2019. possible cardiac sarcoidosis.   Wide-complex tachycardia 2021   wears LifeVest   Past Surgical History:  Procedure Laterality Date   BIV UPGRADE N/A 06/07/2021   Procedure: BIV ICD UPGRADE;  Surgeon: Regan Lemming, MD;  Location: [facility] INVASIVE CV LAB;  Service: Cardiovascular;  Laterality: N/A;   CLIPPING OF ATRIAL APPENDAGE Left 08/29/2022   Procedure: CLIPPING OF ATRIAL APPENDAGE;  Surgeon: Lovett Sox, MD;  Location: [facility] OR;  Service: Open Heart Surgery;  Laterality: Left;   IABP INSERTION N/A 08/25/2022   Procedure: IABP Insertion;  Surgeon: Dolores Patty, MD;  Location: [facility] INVASIVE CV LAB;  Service: Cardiovascular;  Laterality: N/A;   ICD IMPLANT N/A 02/20/2020   Procedure: ICD IMPLANT;  Surgeon: Regan Lemming, MD;  Location: [facility] INVASIVE CV LAB;  Service: Cardiovascular;  Laterality: N/A;   INSERTION OF IMPLANTABLE LEFT VENTRICULAR ASSIST DEVICE N/A 08/29/2022  Procedure: INSERTION OF IMPLANTABLE LEFT VENTRICULAR ASSIST DEVICE;  Surgeon: Lovett Sox, MD;  Location: [facility] OR;  Service: Open Heart Surgery;  Laterality: N/A;   IR CT HEAD LTD  07/10/2022   IR CT HEAD LTD  08/30/2022   IR  PERCUTANEOUS ART THROMBECTOMY/INFUSION INTRACRANIAL INC DIAG ANGIO  07/10/2022   IR PERCUTANEOUS ART THROMBECTOMY/INFUSION INTRACRANIAL INC DIAG ANGIO  08/30/2022   IR US GUIDE VASC ACCESS LEFT  08/30/2022   IR US GUIDE VASC ACCESS RIGHT  07/10/2022   RADIOLOGY WITH ANESTHESIA N/A 07/10/2022   Procedure: RADIOLOGY WITH ANESTHESIA;  Surgeon: Radiologist, Medication, MD;  Location: [facility] OR;  Service: Radiology;  Laterality: N/A;   RADIOLOGY WITH ANESTHESIA N/A 08/30/2022   Procedure: IR WITH ANESTHESIA;  Surgeon: Julieanne Cotton, MD;  Location: [facility] OR;  Service: Radiology;  Laterality: N/A;   REMOVAL OF IMPELLA LEFT VENTRICULAR ASSIST DEVICE N/A 08/29/2022   Procedure: REMOVAL OF INTRA-AORTIC BALLON PUMP;  Surgeon: Lovett Sox, MD;  Location: [facility] OR;  Service: Open Heart Surgery;  Laterality: N/A;   RIGHT HEART CATH N/A 07/14/2022   Procedure: RIGHT HEART CATH;  Surgeon: Dolores Patty, MD;  [facility]: [facility] INVASIVE CV LAB;  Service: Cardiovascular;  Laterality: N/A;   RIGHT HEART CATH N/A 08/25/2022   Procedure: RIGHT HEART CATH;  Surgeon: Dolores Patty, MD;  [facility]: [facility] INVASIVE CV LAB;  Service: Cardiovascular;  Laterality: N/A;   RIGHT HEART CATH N/A 09/12/2022   Procedure: RIGHT HEART CATH;  Surgeon: Laurey Morale, MD;  Location: [facility] INVASIVE CV LAB;  Service: Cardiovascular;  Laterality: N/A;   RIGHT/LEFT HEART CATH AND CORONARY ANGIOGRAPHY N/A 12/16/2019   Procedure: RIGHT/LEFT HEART CATH AND CORONARY ANGIOGRAPHY;  Surgeon: Swazil[place], Peter M, MD;  Location: [facility] INVASIVE CV LAB;  Service: Cardiovascular;  Laterality: N/A;   RIGHT/LEFT HEART CATH AND CORONARY ANGIOGRAPHY N/A 08/10/2022   Procedure: RIGHT/LEFT HEART CATH AND CORONARY ANGIOGRAPHY;  Surgeon: Dolores Patty, MD;  Location: [facility] INVASIVE CV LAB;  Service: Cardiovascular;  Laterality: N/A;   TEE WITHOUT CARDIOVERSION N/A 08/29/2022   Procedure: TRANSESOPHAGEAL ECHOCARDIOGRAM;  Surgeon: Lovett Sox, MD;  Location: [facility] OR;   Service: Open Heart Surgery;  Laterality: N/A;   TOOTH EXTRACTION N/A 08/22/2022   Procedure: DENTAL RESTORATION/EXTRACTIONS;  Surgeon: Ocie Doyne, DMD;  Location: [facility] OR;  Service: Oral Surgery;  Laterality: N/A;   Family History  Problem Relation Age of Onset   Hypertension Father    Heart disease Father    Heart disease Mother    Social History:  reports that he has quit smoking. His smoking use included cigarettes. He has never used smokeless tobacco. He reports current alcohol use of about 6.0 standard drinks of alcohol per week. He reports current drug use. Drug: Marijuana. Allergies:  Allergies  Allergen Reactions   Pacerone [Amiodarone] Other (See Comments)    Severe tremors   Percocet [Oxycodone-Acetaminophen] Itching   Medications Prior to Admission  Medication Sig Dispense Refill   albuterol (PROVENTIL) (2.5 MG/3ML) 0.083% nebulizer solution Take 3 mLs (2.5 mg total) by nebulization every 4 (four) hours as needed for wheezing or shortness of breath. 75 mL 12   ampicillin IVPB Inject 12 g into the vein daily. As a continuous infusion. Indication:  Enterococcus bacteremia in the setting of an LVAD  First Dose: Yes Last Day of Therapy:  11/04/22 Labs - Once weekly:  CBC/D and BMP, Labs - Once weekly: ESR and CRP Method of administration: Ambulatory Pump (Continuous Infusion) Method of administration may be  changed at the discretion of home infusion pharmacist based upon assessment of the patient and/or caregiver's ability to self-administer the medication ordered. 39 Units 0   [START ON 09/28/2022] aspirin EC 81 MG tablet Take 1 tablet (81 mg total) by mouth daily. Swallow whole.     atorvastatin (LIPITOR) 80 MG tablet Take 1 tablet (80 mg total) by mouth daily. 90 tablet 3   cefTRIAXone (ROCEPHIN) IVPB Inject 2 g into the vein every 12 (twelve) hours. Indication:  Enterococcal bacteremia in the setting of an LVAD  First Dose: Yes Last Day of Therapy:  11/04/22  Labs - Once  weekly:  CBC/D and BMP, Labs - Once weekly: ESR and CRP Method of administration: IV Push Method of administration may be changed at the discretion of home infusion pharmacist based upon assessment of the patient and/or caregiver's ability to self-administer the medication ordered. 78 Units 0   [START ON 09/28/2022] digoxin (LANOXIN) 0.125 MG tablet Take 1 tablet (0.125 mg total) by mouth daily.     dorzolamide-timolol (COSOPT) 22.3-6.8 MG/ML ophthalmic solution Place 1 drop into both eyes in the morning and at bedtime.     dronabinol (MARINOL) 2.5 MG capsule Take 1 capsule (2.5 mg total) by mouth 2 (two) times daily before lunch and supper.     feeding supplement (ENSURE ENLIVE / ENSURE PLUS) LIQD Take 237 mLs by mouth 3 (three) times daily between meals. 237 mL 12   fluticasone-salmeterol (ADVAIR HFA) 230-21 MCG/ACT inhaler Inhale 2 puffs into the lungs 2 (two) times daily. 1 each 12   folic acid (FOLVITE) 1 MG tablet TAKE 1 TABLET BY MOUTH EVERY DAY 90 tablet 2   furosemide (LASIX) 40 MG tablet Take 1 tablet (40 mg total) by mouth daily as needed. 90 tablet 3   latanoprost (XALATAN) 0.005 % ophthalmic solution Place 1 drop into both eyes at bedtime.     melatonin 3 MG TABS tablet Take 1 tablet (3 mg total) by mouth at bedtime.     mexiletine (MEXITIL) 250 MG capsule Take 1 capsule (250 mg total) by mouth 2 (two) times daily. 180 capsule 3   midodrine (PROAMATINE) 5 MG tablet Take 1 tablet (5 mg total) by mouth every 8 (eight) hours.     mirtazapine (REMERON SOL-TAB) 15 MG disintegrating tablet Take 0.5 tablets (7.5 mg total) by mouth at bedtime.     [START ON 09/28/2022] Multiple Vitamin (MULTIVITAMIN WITH MINERALS) TABS tablet Take 1 tablet by mouth daily.     oxyCODONE (OXY IR/ROXICODONE) 5 MG immediate release tablet Take 1 tablet (5 mg total) by mouth every 4 (four) hours as needed for severe pain or moderate pain.     pantoprazole (PROTONIX) 40 MG tablet Take 40 mg by mouth daily before  breakfast.     [START ON 09/28/2022] sertraline (ZOLOFT) 50 MG tablet Take 1 tablet (50 mg total) by mouth daily.     sildenafil (REVATIO) 20 MG tablet Take 1 tablet (20 mg total) by mouth 3 (three) times daily.     traMADol (ULTRAM) 50 MG tablet Take 1-2 tablets (50-100 mg total) by mouth every 4 (four) hours as needed for moderate pain.     [START ON 09/28/2022] umeclidinium bromide (INCRUSE ELLIPTA) 62.5 MCG/ACT AEPB Inhale 1 puff into the lungs daily.     warfarin (COUMADIN) 1 MG tablet Take 0.5 tablets (0.5 mg total) by mouth daily at 4 PM.     Home: Home Living Family/patient expects to be discharged to:: Private residence  Living Arrangements: Alone Available Help at Discharge: Family, Available 24 hours/day (cousin and her spouse 24/7; dtr expecting baby in Sept. Her 3rd) Type of Home: Mobile home Home Access: Stairs to enter [employer] of Steps: 1 Entrance Stairs-Rails: Right, Left Home Layout: One level Bathroom Shower/Tub: Health visitor: Standard Bathroom Accessibility: Yes Home Equipment: None Additional Comments: If pt has LVAD he will go stay with cousins initially in 1 level home.   Functional History: Prior Function Prior Level of Function : Independent/Modified Independent, Driving ADLs Comments: does cooking / cleaning   Functional Status:  Mobility: Bed Mobility Overal bed mobility: Needs Assistance Bed Mobility: Rolling, Sidelying to Sit Rolling: Min assist Sidelying to sit: Mod assist, HOB elevated Supine to sit: Mod assist Sit to supine: Min assist General bed mobility comments: min A to elevate feet Transfers Overall transfer level: Needs assistance Equipment used: 1 person hand held assist Transfers: Bed to chair/wheelchair/BSC, Sit to/from Stand Sit to Stand: Min assist Bed to/from chair/wheelchair/BSC transfer type:: Step pivot Step pivot transfers: Min assist Transfer via Lift Equipment: Stedy General transfer comment:  pt minA from elevated bed to power up, minA to steady during step pvt transfer to [facility] and then back to EOB. decreased L LE step height but no L knee buckling this date Ambulation/Gait Ambulation/Gait assistance: Mod assist, +2 safety/equipment Gait Distance (Feet): 120 Feet Assistive device: Fara Boros Gait Pattern/deviations: Step-through pattern, Decreased step length - right, Decreased step length - left, Trunk flexed General Gait Details: pt requires minA to help guide eva walker, pt very dependent on Eva walker, limited use of L UE to suport self on regular RW, 2nd person for line management, constant verbal cues to stand up tall and look forward Gait velocity: decr Gait velocity interpretation: <1.31 ft/sec, indicative of household ambulator Pre-gait activities: w/shifting and low amplitude steps in place during 1 of the standing trials. Stairs assistance: Supervision Stair Management: One rail Left, Alternating pattern, Forwards General stair comments: ascended no rail, descended holding rail with cues   ADL: ADL Overall ADL's : Needs assistance/impaired Eating/Feeding: Sitting, Minimal assistance Grooming: Oral care, Brushing hair, Wash/dry face, Standing, Sitting, Contact guard assist, Minimal assistance Grooming Details (indicate cue type and reason): minA when standing for steadying assist Upper Body Bathing: Moderate assistance, Sitting Lower Body Bathing: Maximal assistance, Sitting/lateral leans Upper Body Dressing : Moderate assistance, Sitting Upper Body Dressing Details (indicate cue type and reason): doffling VAD vest Lower Body Dressing: Moderate assistance, Sitting/lateral leans Lower Body Dressing Details (indicate cue type and reason): donning socks, attempting figure 4 Toilet Transfer: Minimal assistance Toilet Transfer Details (indicate cue type and reason): 1 person HHA Toileting- Clothing Manipulation and Hygiene: Total assistance, Sit to/from stand, +2 for  safety/equipment Toileting - Clothing Manipulation Details (indicate cue type and reason): total A in standing for rear peri care Functional mobility during ADLs: Minimal assistance General ADL Comments: very limited standing tolerance   Cognition: Cognition Overall Cognitive Status: Impaired/Different from baseline Orientation Level: Oriented X4 Cognition Arousal: Alert Behavior During Therapy: WFL for tasks assessed/performed Overall Cognitive Status: Impaired/Different from baseline Area of Impairment: Safety/judgement, Problem solving, Memory Memory: Decreased recall of precautions Following Commands: Follows one step commands consistently Safety/Judgement: Decreased awareness of deficits Awareness: Emergent Problem Solving: Slow processing General Comments: min cues for sternal prec this session,     Physical Exam: Blood pressure 95/75, pulse 77, temperature 98.1 F (36.7 C), temperature source Oral, resp. rate 15, height 5\' 7"  (1.702 m), weight 80.1 kg,  SpO2 93%. Physical Exam Gen: no distress, normal appearing HEENT: oral mucosa pink and moist, NCAT Cardio: +LVAD Chest: normal effort, normal rate of breathing Abd: soft, non-distended Ext: no edema Psych: pleasant, normal affect Skin: intact Neuro: Alert and oriented. Left sided strength 3/5, right intact. Decreased sensation in left side.   Results for orders placed or performed during the hospital encounter of 08/10/22 (from the past 48 hour(s))  Glucose, capillary     Status: None   Collection Time: 09/25/22 10:13 PM  Result Value Ref Range   Glucose-Capillary 72 70 - 99 mg/dL    Comment: Glucose reference range applies only to samples taken after fasting for at least 8 hours.  Brain natriuretic peptide     Status: Abnormal   Collection Time: 09/25/22 11:51 PM  Result Value Ref Range   B Natriuretic Peptide 851.4 (H) 0.0 - 100.0 pg/mL    Comment: Performed at [facility], [address]  [place], [place] [address]  Lactate dehydrogenase     Status: Abnormal   Collection Time: 09/26/22  2:23 AM  Result Value Ref Range   LDH 251 (H) 98 - 192 U/L    Comment: Performed at [facility], [address] [place], [place] [address]  Protime-INR     Status: Abnormal   Collection Time: 09/26/22  2:23 AM  Result Value Ref Range   Prothrombin Time 28.1 (H) 11.4 - 15.2 seconds   INR 2.6 (H) 0.8 - 1.2    Comment: (NOTE) INR goal varies based on device and disease states. Performed at [facility], [address], [place], [place] [address]   Magnesium     Status: None   Collection Time: 09/26/22  2:23 AM  Result Value Ref Range   Magnesium 1.8 1.7 - 2.4 mg/dL    Comment: Performed at [facility], [address], [place], [place] [address]  Basic metabolic panel     Status: Abnormal   Collection Time: 09/26/22  2:23 AM  Result Value Ref Range   Sodium 137 135 - 145 mmol/L   Potassium 3.7 3.5 - 5.1 mmol/L   Chloride 99 98 - 111 mmol/L   CO2 27 22 - 32 mmol/L   Glucose, Bld 77 70 - 99 mg/dL    Comment: Glucose reference range applies only to samples taken after fasting for at least 8 hours.   BUN 12 6 - 20 mg/dL   Creatinine, Ser 6.44 0.61 - 1.24 mg/dL   Calcium 8.5 (L) 8.9 - 10.3 mg/dL   GFR, Estimated >03 >47 mL/min    Comment: (NOTE) Calculated using the CKD-EPI Creatinine Equation (2021)    Anion gap 11 5 - 15    Comment: Performed at [facility], [address], [place], [place] [address]  CBC     Status: Abnormal   Collection Time: 09/26/22  2:23 AM  Result Value Ref Range   WBC 9.1 4.0 - 10.5 K/uL   RBC 3.31 (L) 4.22 - 5.81 MIL/uL   Hemoglobin 9.2 (L) 13.0 - 17.0 g/dL   HCT 63.8 (L) 75.6 - 43.3 %   MCV 92.1 80.0 - 100.0 fL   MCH 27.8 26.0 - 34.0 pg   MCHC 30.2 30.0 - 36.0 g/dL   RDW 29.5 (H) 18.8 - 41.6 %   Platelets 283 150 - 400 K/uL   nRBC 0.0 0.0 - 0.2 %    Comment: Performed at [facility],  [address] [place], [place]  [address]  Glucose, capillary     Status: None   Collection Time: 09/26/22  6:24 AM  Result Value Ref Range   Glucose-Capillary 83 70 - 99 mg/dL    Comment: Glucose reference range applies only to samples taken after fasting for at least 8 hours.  Glucose, capillary     Status: Abnormal   Collection Time: 09/26/22 11:26 AM  Result Value Ref Range   Glucose-Capillary 126 (H) 70 - 99 mg/dL    Comment: Glucose reference range applies only to samples taken after fasting for at least 8 hours.  Glucose, capillary     Status: Abnormal   Collection Time: 09/26/22  4:15 PM  Result Value Ref Range   Glucose-Capillary 103 (H) 70 - 99 mg/dL    Comment: Glucose reference range applies only to samples taken after fasting for at least 8 hours.  Glucose, capillary     Status: Abnormal   Collection Time: 09/26/22  9:54 PM  Result Value Ref Range   Glucose-Capillary 120 (H) 70 - 99 mg/dL    Comment: Glucose reference range applies only to samples taken after fasting for at least 8 hours.   Comment 1 Notify RN    Comment 2 Document in Chart   Lactate dehydrogenase     Status: Abnormal   Collection Time: 09/27/22  2:26 AM  Result Value Ref Range   LDH 252 (H) 98 - 192 U/L    Comment: Performed at [facility], [address], [place], [place] [address]  Protime-INR     Status: Abnormal   Collection Time: 09/27/22  2:26 AM  Result Value Ref Range   Prothrombin Time 28.4 (H) 11.4 - 15.2 seconds   INR 2.6 (H) 0.8 - 1.2    Comment: (NOTE) INR goal varies based on device and disease states. Performed at [facility], [address], [place], [place] [address]   Magnesium     Status: None   Collection Time: 09/27/22  2:26 AM  Result Value Ref Range   Magnesium 2.0 1.7 - 2.4 mg/dL    Comment: Performed at [facility], [address] [place], [place] [address]  CBC     Status: Abnormal   Collection Time: 09/27/22  2:26 AM  Result Value Ref Range   WBC 9.7 4.0 - 10.5 K/uL   RBC 3.46  (L) 4.22 - 5.81 MIL/uL   Hemoglobin 9.5 (L) 13.0 - 17.0 g/dL   HCT 29.5 (L) 62.1 - 30.8 %   MCV 94.2 80.0 - 100.0 fL   MCH 27.5 26.0 - 34.0 pg   MCHC 29.1 (L) 30.0 - 36.0 g/dL   RDW 65.7 (H) 84.6 - 96.2 %   Platelets 307 150 - 400 K/uL   nRBC 0.0 0.0 - 0.2 %    Comment: Performed at [facility], [address] [place], [place] [address]  Basic metabolic panel     Status: Abnormal   Collection Time: 09/27/22  2:26 AM  Result Value Ref Range   Sodium 139 135 - 145 mmol/L   Potassium 4.1 3.5 - 5.1 mmol/L   Chloride 102 98 - 111 mmol/L   CO2 25 22 - 32 mmol/L   Glucose, Bld 113 (H) 70 - 99 mg/dL    Comment: Glucose reference range applies only to samples taken after fasting for at least 8 hours.   BUN 12 6 - 20 mg/dL  Creatinine, Ser 0.70 0.61 - 1.24 mg/dL   Calcium 8.5 (L) 8.9 - 10.3 mg/dL   GFR, Estimated >16 >10 mL/min    Comment: (NOTE) Calculated using the CKD-EPI Creatinine Equation (2021)    Anion gap 12 5 - 15    Comment: Performed at [facility], [address], [place], [place] [address]  Glucose, capillary     Status: None   Collection Time: 09/27/22  6:37 AM  Result Value Ref Range   Glucose-Capillary 85 70 - 99 mg/dL    Comment: Glucose reference range applies only to samples taken after fasting for at least 8 hours.   Comment 1 Notify RN    Comment 2 Document in Chart   Glucose, capillary     Status: None   Collection Time: 09/27/22 11:37 AM  Result Value Ref Range   Glucose-Capillary 79 70 - 99 mg/dL    Comment: Glucose reference range applies only to samples taken after fasting for at least 8 hours.   No results found.    Blood pressure 95/75, pulse 77, temperature 98.1 F (36.7 C), temperature source Oral, resp. rate 15, height 5\' 7"  (1.702 m), weight 80.1 kg, SpO2 93%.  Medical Problem List and Plan: 1. Functional deficits secondary to acute right MCA infarct  -patient may shower  -ELOS/Goals: modI/S 10-14 days  Admit to CIR  2.   Antithrombotics: -DVT/anticoagulation:  Pharmaceutical: Coumadin  -antiplatelet therapy: Aspirin 81 mg daily  3. Pain Management: Tylenol as needed  4. Mood/Behavior/Sleep: delirium/insomnia - LCSW to evaluate and provide emotional support  -continue mirtazapine 7.5 mg q HS  -continue sertraline 50 mg daily  -continue melatonin 3 mg q HS  -antipsychotic agents: n/a  5. Neuropsych/cognition: This patient is capable of making decisions on his own behalf.  6. Skin/Wound Care: Routine skin care checks   7. Fluids/Electrolytes/Nutrition: strict Is and Os and follow-up chemistries  -poor PO intake on Marinol  -continue supplements  8: Hypertension: monitor TID and prn  9: Hyperlipidemia: continue statin  10: Enterococcus bacteremia: 6 weeks IV antibiotic  -follow-up with ID  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: Home meds: Coreg 3.125, Lasix 40mg , Lostartan 25mg , Aldactone 12.5, Farxiga 10mg , mexilitene   12: Acute on chronic systolic heart failure with cardiogenic shock s/p LVAD  -daily weight  -continue digoxin 0.125 mg daily  -continue mexiletine 250 mg BID  -continue midodrine 5 mg q 8 hours  -continue sildenafil 20 mg q 8 hours  -continue warfarin per pharmacy  13: Hx of VT: continue mexiletine, digoxin; has ICD  14: DM-2: A1c = 5.5%; monitor PO intake; DC SSI (Farxiga at home)  15: OSA: needs outpatient sleep study  16: Anemia, iron deficiency: follow-up CBC  17: Severe COPD:   -continue Ellipta one puff daily  18: s/p tooth extractions this admission: monitor PO tolerance  19: Glaucoma: continue Cosopt and Xalatan  I have personally performed a face to face diagnostic evaluation, including, but not limited to relevant history and physical exam findings, of this patient and developed relevant assessment and plan.  Additionally, I have reviewed and concur with the physician assistant's documentation above.  Wendi Maya, PA  Horton Chin, MD 09/27/2022

## 2022-09-27 NOTE — Progress Notes (Signed)
Inpatient Rehabilitation Admission Medication Review by a Pharmacist  A complete drug regimen review was completed for this patient to identify any potential clinically significant medication issues.  High Risk Drug Classes Is patient taking? Indication by Medication  Antipsychotic {Receiving?:26196}   Anticoagulant Yes Warfarin- AF/LVAD   Antibiotic Yes, as an intravenous medication Ceftriaxone/ampicillin (EOT 11/04/22)- IE   Opioid Yes Oxycodone PRN- acute pain   Antiplatelet Yes bASA- recent CVA/VAD    Hypoglycemics/insulin {Yes or No?:26198}   Vasoactive Medication {Receiving?:26196} Digoxin- HF  Midodrine- BP support  Mexiletine- VT  Sildenafil- PH   Chemotherapy No   Other {Yes or No?:26198} Lipitor- HLD Marinol- appetite  Melatonin- insomnia  Mirtazipine,sertraline Dulera/incruse- COPD   Protonix-Reflux      Type of Medication Issue Identified Description of Issue Recommendation(s)  Drug Interaction(s) (clinically significant)     Duplicate Therapy     Allergy     No Medication Administration End Date     Incorrect Dose     Additional Drug Therapy Needed     Significant med changes from prior encounter (inform family/care partners about these prior to discharge). STOP apixaban, START warfarin   Methotrexate, spironolactone 12.5 on hold    Other       Clinically significant medication issues were identified that warrant physician communication and completion of prescribed/recommended actions by midnight of the next day:  {Yes or No?:26198}  Name of provider notified for urgent issues identified:   Provider Method of Notification:     Pharmacist comments:   Time spent performing this drug regimen review (minutes):  ***   Jani Gravel, PharmD Clinical Pharmacist  09/27/2022 1:57 PM

## 2022-09-27 NOTE — Progress Notes (Signed)
The chaplain is present for F/U spiritual care. The Pt. is sleeping at the time of the visit and did not respond to his name. The chaplain received an update from the Pt. RN-Justin. The chaplain understands the Pt. is moving to 4W.  This chaplain is available for F/U spiritual care as needed.  Chaplain Stephanie Acre [phone]

## 2022-09-27 NOTE — Progress Notes (Signed)
Patient ID: Charles Holmes, male   DOB: 12/16/1965, 57 y.o.   MRN: [id number]     Advanced Heart Failure Rounding Note  PCP-Cardiologist: Charles Herrlich, MD  [facility]: Dr. Gala Holmes   Subjective:    7/25: Admit with cardiogenic shock. Started milrinone and NE. 8/9 IABP placed 8/13 S/p HM III LVAD implant + clipping left atrial appendage d/t severe thickening and invagination of mitral valve annulus impeding flows. 8/14 Left-sided hemiplegia. CT head with acute R MCA infarct. IR for thrombectomy. CT head with small to moderate area of hemorrhagic conversion 8/17 Low dose heparin gtt restarted, CT head with decreased size of hemorrhagic stroke.  8/19: Limited echo: Mod RV dysfunction, better mitral inflow into the LV and VAD position satisfactory. 8/26: Echo showed moderate-severe RV dysfunction but RV not enlarged.  Interventricular septum does bow towards towards the right. Speed increased to 5400 rpm, flow rose to 4.9 L/min.  Interventricular septum remained stable bowing slightly towards the right.    8/27: RHC with RA mean 5, PA 30/7, mean PCWP 4, CI 3.49, PAPi 4.6 9/4: Ramp Echo>>Fixed speed 5400  9/6: fever. Bcx with enterococcus facelis 2/2  Remains on ampicillin + ceftriaxone. Repeat BC X 2 09/08 NGTD. AF. No leukocytosis.  No complaints. Ate half of breakfast. More motivated to participate in PT/OT.   LVAD Interrogation HM III: Speed: 5400 Flow:4.4  PI: 3.9 Power: 4. No PI events so far this am. VAD interrogated personally. Parameters stable.  Objective:   Weight Range: 79.4 kg Body mass index is 27.42 kg/m.   Vital Signs:   Temp:  [98 F (36.7 C)-98.6 F (37 C)] 98.1 F (36.7 C) (09/11 0717) Pulse Rate:  [30-119] 90 (09/11 0717) Resp:  [16-20] 19 (09/11 0717) BP: (94-121)/(63-88) 100/88 (09/11 0813) SpO2:  [89 %-96 %] 89 % (09/11 0717) Weight:  [79.4 kg] 79.4 kg (09/11 0600) Last BM Date : 09/26/22  Weight change: Filed Weights   09/25/22 0500 09/26/22 0433 09/27/22  0600  Weight: 80.1 kg 81.9 kg 79.4 kg   Intake/Output:   Intake/Output Summary (Last 24 hours) at 09/27/2022 0833 Last data filed at 09/27/2022 0648 Gross per 24 hour  Intake 410 ml  Output 1050 ml  Net -640 ml      Physical Exam   Physical Exam: GENERAL: Lying comfortable in bed HEENT: normal  NECK: Supple, JVP  .  2+ bilaterally, no bruits.  No lymphadenopathy or thyromegaly appreciated.   CARDIAC:  Mechanical heart sounds with LVAD hum present.  LUNGS:  Clear to auscultation bilaterally.  ABDOMEN:  Soft, round, nontender, positive bowel sounds x4.     LVAD exit site:Dressing dry and intact. Stabilization device present and accurately applied.  Driveline dressing is being changed daily per sterile technique. EXTREMITIES:  Warm and dry, no cyanosis, clubbing, rash or edema  NEUROLOGIC:  Alert and oriented x 4. Affect pleasant.        Telemetry    Sinus rhythm  Labs    CBC Recent Labs    09/26/22 0223 09/27/22 0226  WBC 9.1 9.7  HGB 9.2* 9.5*  HCT 30.5* 32.6*  MCV 92.1 94.2  PLT 283 307    Basic Metabolic Panel Recent Labs    95/28/41 0223 09/27/22 0226  NA 137 139  K 3.7 4.1  CL 99 102  CO2 27 25  GLUCOSE 77 113*  BUN 12 12  CREATININE 0.67 0.70  CALCIUM 8.5* 8.5*  MG 1.8 2.0    BNP: BNP (last 3  results) Recent Labs    09/11/22 2252 09/18/22 2246 09/25/22 2351  BNP 933.0* 809.0* 851.4*     Imaging    No results found.   Medications:     Scheduled Medications:  sodium chloride   Intravenous Once   aspirin EC  81 mg Oral Daily   atorvastatin  80 mg Oral Daily   Chlorhexidine Gluconate Cloth  6 each Topical Q0600   digoxin  0.125 mg Oral Daily   dorzolamide-timolol  1 drop Both Eyes BID   dronabinol  2.5 mg Oral BID AC   feeding supplement  237 mL Oral TID BM   Gerhardt's butt cream   Topical TID   insulin aspart  0-24 Units Subcutaneous TID WC & HS   latanoprost  1 drop Both Eyes QHS   melatonin  3 mg Oral QHS   mexiletine   250 mg Oral BID   midodrine  5 mg Oral Q8H   mirtazapine  7.5 mg Oral QHS   mometasone-formoterol  2 puff Inhalation BID   multivitamin with minerals  1 tablet Oral Daily   mouth rinse  15 mL Mouth Rinse 4 times per day   pantoprazole  40 mg Oral Daily   sertraline  50 mg Oral Daily   sildenafil  20 mg Oral TID   sodium chloride flush  3 mL Intravenous Q12H   umeclidinium bromide  1 puff Inhalation Daily   Warfarin - Pharmacist Dosing Inpatient   Does not apply q1600    Infusions:  sodium chloride Stopped (09/13/22 2035)   ampicillin (OMNIPEN) IV 2 g (09/27/22 0427)   cefTRIAXone (ROCEPHIN)  IV 2 g (09/26/22 2233)    PRN Medications: sodium chloride, acetaminophen, albuterol, dextrose, ondansetron (ZOFRAN) IV, mouth rinse, oxyCODONE, polyethylene glycol, sodium chloride, sodium chloride flush, traMADol   Patient Profile  Charles Holmes is a 57 y.o. 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 Admitted with cardiogenic shock, stabilized and underwent HM3 LVAD. Post implant course c/b acute CVA and enterococcus faecalis bacteremia.   Assessment/Plan  1.  Acute on chronic Systolic HF-->Cardiogenic Shock  - Diagnosed 11/2019. Presented with VT. LHC 70% LAD  - cMRI 12/21 concerning for sarcoid and EF 18%.  - [facility] EF 25% + active sarcoid - Echo 08/26/20 EF < 20% severely dilated LV RV mildly decreased.  - Medtronic CRT-D upgrade in 06/08/21 - Echo 07/10/22: EF <20%, RV okay, mod pericardial effusion, mod Charles/TR - Admitted 07/25 with cardiogenic shock. - RHC: Nonobstructive CAD, severely elevated filling pressures and low Fick CO/CI (2.7/1.4) - 08/13 HM III LVAD implant + clipping LAA d/t severe thickening and invagination of mitral valve annulus impeding flows.  - Apical core sent - no mention of sarcoid - Speed increased to 5300 on 08/14.  - Echo 8/26 mod-sev RV dysfunction. Speed increased to 5400 8/26 - RHC  8/27: low filing pressures with excellent cardiac output on EPI, PAPi 4.6 - Ramp Echo 9/4, speed increased to 5400  - MAPs 70s-90s. Midodrine dropped to 5 TID on 09/10. - Volume stable. Lasix PRN. - Continue digoxin 0.125 mg daily - Eventual CIR. Hopefully this week  - Delirium is improving; however, had some hallucinations yesterday.   2. HM-3 LVAD - VAD interrogated personally. Parameters stable. - LDH stable - INR 2.6.. ASA 81 mg daily. Discussed Warfarin dosing with PharmD personally.  3.  Acute stroke - Hx CVA 06/24 -Admitted 06/24  w/ R MCA stroke. S/p TPA and mechanical clot extraction. No residual deficits. Likely cardioembolic in setting of severe LV dysfunction. - Developed left sided weakness 08/14. CTA with R MCA infarct. Taken to IR for thrombectomy - Repeat CT head with small to moderate size hemorrhagic conversion.  - repeat head CT on 8/17 w/ improved hemorrhagic CVA - Back on warfarin. INR 2.6. - neurology signed off  - Continue PT/OT - Continue ASA 81 mg daily - Plan CIR   4. Enterococcus faecalis bacteremia - Bcx 2/2 on 9/6 - ID consult 9/6 -> ampicillin and ceftriaxone - TEE recommended but doubt it will change management as VAD cannot be removed and will require IV abx followed by long-term suppression - Echo 09/10 - no obvious vegetations - PICC out - Repeat BCx 9/8 NGTD. - ID recommending 6 weeks IV amp/ceftriaxone followed by long-term suppression with amoxicillin  5. Hx VT - ln setting of potential sarcoid heart disease  - Off amio due to tremor. Continue mexiletine  - now s/p ICD.   6. CAD - LHC 12/07/19 70-% LAD, no intervention - LHC 8/24 non obstructive CAD.  - Continue statin. On aspirin for VAD. - No s/s angina  7. 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  - holding methotrexate w/ recent surgery and active infection, can discuss timing of restarting the medication at outpatient  follow-up - apical core pathology not diagnostic of cardiac sarcoidosis.   8. 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 AKI - suspect cardiorenal, improved w/ inotropic support - Creatinine stable now   10. Iron deficiency anemia/ Post-op anemia - recent T sat 15%, scheduled for OP feraheme. Will complete inpatient  - Transfused 1 u RBCs 8/15 - Hgb at 9.5   11. Pulmonary  - PFTs with severe obstructive defect, response to bronchodilator. FEV1 1.04L, FEV1/FVC 48% - extubated 8/15  - Stable  12. Depression - Improving - Continue remeron and sertraline   Plan for CIR at discharge. Awaiting insurance auth.  Will ask HF TOC CSW to assist with arranging IV antibiotics at discharge.  Atalia Litzinger 10:52 AM

## 2022-09-27 NOTE — Plan of Care (Signed)
  Problem: Education: Goal: Understanding of CV disease, CV risk reduction, and recovery process will improve Outcome: Progressing   Problem: Cardiovascular: Goal: Ability to achieve and maintain adequate cardiovascular perfusion will improve Outcome: Progressing   Problem: Education: Goal: Knowledge of General Education information will improve Description: Including pain rating scale, medication(s)/side effects and non-pharmacologic comfort measures Outcome: Progressing   Problem: Clinical Measurements: Goal: Ability to maintain clinical measurements within normal limits will improve Outcome: Progressing Goal: Will remain free from infection Outcome: Progressing Goal: Respiratory complications will improve Outcome: Progressing   Problem: Nutrition: Goal: Adequate nutrition will be maintained Outcome: Progressing

## 2022-09-27 NOTE — Progress Notes (Addendum)
  Inpatient Rehabilitation Admissions Coordinator   [facility] MD has requested peer to peer. Dr Carlis Abbott to call before 3 pm today to complete. I await insurance determination for a possible CIR admit.  Ottie Glazier, RN, MSN Rehab Admissions Coordinator [phone] 09/27/2022 11:11 AM

## 2022-09-27 NOTE — Progress Notes (Signed)
PT Cancellation Note  Patient Details Name: ANGELLO DURRER MRN: [id number] DOB: [birth year]   Cancelled Treatment:    Reason Eval/Treat Not Completed: Patient declined, no reason specified.  Not now, I'll go later. Likely will not get back today, pt to go to [facility] today. 09/27/2022  Jacinto Halim., PT Acute Rehabilitation Services [phone]  (office)   Eliseo Gum [place] Divis 09/27/2022, 2:23 PM

## 2022-09-27 NOTE — Progress Notes (Signed)
LVAD Coordinator Rounding Note:  Admitted 08/10/22 due to acute on chronic CHF with cardiogenic shock. Milrinone dependent. Advance therapy workup completed, and pt deemed acceptable VAD candidate. Dental extractions completed 8/6. IABP placed 08/25/22.  HM 3 LVAD implanted on 08/29/22 by Dr Donata Clay under destination therapy criteria. 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 Result negative.  7/25 Admit with cardiogenic shock. Started milrinone and NE. 8/6 S/P 13 teeth extractions  8/9 IABP placed 8/13 S/p HM III LVAD implant + clipping left atrial appendage d/t severe thickening and invagination of mitral valve annulus impeding flows  CT Head 8/14 (initial) 1. 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 ASPECTS is 7. 2. No acute hemorrhage.  CT Angio Head/Neck 1. 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 2. Core infarct of 12 cc (somewhat underestimated compared to aspects) with 90 cc of penumbra. 3. Mild atherosclerosis.  Pt taken emergently to IR for percutaneous right common carotid arteriogram with thrombectomy. Revascularization achieved. Angio-seal closure device applied to left groin- clean, dry, and intact.   CT Head 8/15 @ 0450 Unchanged extent of infarct and hemorrhage in the right MCA distribution including small volume intraventricular clot. No hydrocephalus.  CT Head 8/17 @ 0701 Interval evolution of the right MCA territory infarct with decreased intraparenchymal and intraventricular hemorrhage. No hydrocephalus or midline shift.   Pt sitting in chair on my arrival. States he feels as if he is making progress with his therapy. Insurance approval received for CIR. Plan to transfer pt today.  WBC trending down 9.7 today. Blood cultures pending. Blood cultures from 9/6 positive for Enterococcus Faecalis. Subsequent blood cultures pending. ID following.  Pt working with PT/OT/SLP. CIR consulted once medically  stable. Pt encouraged to participate in therapy.   VAD coordinator/nurses working with pt on changing power sources. He is unable to do this due to his left sided weakness. Modification of his power cords was made in hopes he can grip the cords better. Has practice power cords for use by pt. See below for education documentation.   Vital signs: Temp: 98 HR: 73 Doppler Pressure: 92 Automatic BP: 100/88 (94) O2 Sat: 93% on RA Wt: 183.6>191.1>190.9>190.2>184.8>178.8>178.1>172.4>170.6>173.2>167.9>167.1>163.1>162.5>171.3>172.4>176.6> 180.6>175.1lbs   LVAD interrogation reveals:  Speed: 5450 Flow: 4.2 Power: 4.0 w PI: 4.9  Alarms: none Events: rare  Hematocrit: 32 Fixed speed: 5400 Low speed limit: 5100  Drive Line: Existing VAD dressing removed and site care performed using sterile technique. Drive line exit site cleaned with Chlora prep applicators x 2, allowed to dry, and Vashe moistened 2x2 placed around driveline then covered with dry 4x4. Exit site healed and incorporated, the velour is fully implanted at exit site. Small amount of serous drainage.Slight redness, no tenderness, foul odor or rash noted. Proximal suture remains. Drive line anchor re-applied. Pt denies fever or chills. Continue Monday/ Wednesday/Friday dressing changes. Next dressing change due 09/29/22 by VAD coordinator or nurse champion only.    Labs:  LDH trend: 596>441>165>384>345>312>312>350>327>309>316>305>298>277>273>298>280>285>251>252  INR trend: 1.4>1.6>1.1>1.1>1.3>1.2>1>1.3>1.4>1.5>1.6>2.8>2.8>2.3>2.2>2.3>2.6>2.6  AST/ALT trend: 218/45>96/30>73/23>63/36>58/37>69/50>93/72>123/103>143/150>117/138>119/138>92/127>87/123>50/71>46/65  Total Bili trend: 4.4>4.6>5.1>1.8>1.9>1.2>1.0>1.5>1.1>0.8>0.9>1.0  WBC trend: 11.6>13.7>19.6>16.1>23>26.6>21>24.6>18.1>16.5>13.2>13.1>12.5>11.6>10.5>10.8>13.1>9.5>9.1>9.7  Anticoagulation Plan: -INR Goal: 2.0 - 2.5 -ASA Dose: 81 mg  - Coumadin dosing per pharmacy  Blood  Products:  IntraOp 8/14: - 4 FFP - 2 Platelets - 2 PRBC - 1 cyro - 449 cc of cellsaver - DDAVP 20 mcg x 1   Device: Medtronic BiV -  -Therapies: OFF  Arrythmias:   Respiratory: RA  Infection:  09/01/22>> sputum  cx>> NGTD>>final 09/01/22>> blood cx>> NGTD>>final 09/01/22>> urine cx>> NGTD>> final 09/13/22>>blood cxs>>no growth 5 days; final 09/22/22>>blood cxs>> Enterococcus faecalis  09/23/22>> blood cxs>> pending 09/24/22>> blood cxs>> pending  Renal:  9/11: BUN/CRT: 12/0.7  Adverse Events on VAD: - 08/30/22: - Developed left sided weakness this am. CTA with R MCA infarct. Taken to IR for thrombectomy   Drips:    Patient Education: Discharge teaching completed with pt's [name] on 9/5. See separate note for documentation.   Plan/Recommendations:  Please page VAD coordinator for any alarms or VAD equipment issues. Continue MWF dressing changes by VAD coordinator or Nurse Alla Feeling.  Simmie Davies RN,BSN VAD Coordinator  Office: [phone]  24/7 Pager: [phone]

## 2022-09-27 NOTE — Progress Notes (Signed)
Gave report to nurse on 4w, gathered patient belongings, and patient was wheeled out to CIR 4w12

## 2022-09-28 ENCOUNTER — Other Ambulatory Visit: Payer: Self-pay

## 2022-09-28 DIAGNOSIS — I5023 Acute on chronic systolic (congestive) heart failure: Secondary | ICD-10-CM | POA: Diagnosis not present

## 2022-09-28 DIAGNOSIS — Z95811 Presence of heart assist device: Secondary | ICD-10-CM | POA: Diagnosis not present

## 2022-09-28 DIAGNOSIS — I63511 Cerebral infarction due to unspecified occlusion or stenosis of right middle cerebral artery: Secondary | ICD-10-CM | POA: Diagnosis not present

## 2022-09-28 DIAGNOSIS — G479 Sleep disorder, unspecified: Secondary | ICD-10-CM | POA: Diagnosis not present

## 2022-09-28 LAB — COMPREHENSIVE METABOLIC PANEL
ALT: 43 U/L (ref 0–44)
AST: 46 U/L — ABNORMAL HIGH (ref 15–41)
Albumin: 2.4 g/dL — ABNORMAL LOW (ref 3.5–5.0)
Alkaline Phosphatase: 101 U/L (ref 38–126)
Anion gap: 8 (ref 5–15)
BUN: 8 mg/dL (ref 6–20)
CO2: 25 mmol/L (ref 22–32)
Calcium: 8.7 mg/dL — ABNORMAL LOW (ref 8.9–10.3)
Chloride: 105 mmol/L (ref 98–111)
Creatinine, Ser: 0.62 mg/dL (ref 0.61–1.24)
GFR, Estimated: >60 mL/min (ref >60)
Glucose, Bld: 127 mg/dL — ABNORMAL HIGH (ref 70–99)
Potassium: 3.9 mmol/L (ref 3.5–5.1)
Sodium: 138 mmol/L (ref 135–145)
Total Bilirubin: 0.8 mg/dL (ref 0.3–1.2)
Total Protein: 6.1 g/dL — ABNORMAL LOW (ref 6.5–8.1)

## 2022-09-28 LAB — PROTIME-INR
INR: 1.7 — ABNORMAL HIGH (ref 0.8–1.2)
Prothrombin Time: 19.8 s — ABNORMAL HIGH (ref 11.4–15.2)

## 2022-09-28 LAB — CULTURE, BLOOD (ROUTINE X 2)
Culture: NO GROWTH
Culture: NO GROWTH
Special Requests: ADEQUATE
Special Requests: ADEQUATE

## 2022-09-28 LAB — MAGNESIUM: Magnesium: 1.8 mg/dL (ref 1.7–2.4)

## 2022-09-28 MED ORDER — WARFARIN SODIUM 2.5 MG PO TABS
2.5000 mg | ORAL_TABLET | Freq: Once | ORAL | Status: AC
  Administered 2022-09-28: 2.5 mg via ORAL
  Filled 2022-09-28: qty 1

## 2022-09-28 MED ORDER — POTASSIUM CHLORIDE CRYS ER 20 MEQ PO TBCR
20.0000 meq | EXTENDED_RELEASE_TABLET | Freq: Once | ORAL | Status: AC
  Administered 2022-09-28: 20 meq via ORAL
  Filled 2022-09-28: qty 1

## 2022-09-28 MED ORDER — MAGNESIUM SULFATE 2 GM/50ML IV SOLN
2.0000 g | Freq: Once | INTRAVENOUS | Status: AC
  Administered 2022-09-28: 2 g via INTRAVENOUS
  Filled 2022-09-28: qty 50

## 2022-09-28 NOTE — Progress Notes (Signed)
Inpatient Rehabilitation Center Individual Statement of Services  Patient Name:  Charles Holmes  Date:  09/28/2022  Welcome to the Inpatient Rehabilitation Center.  Our goal is to provide you with an individualized program based on your diagnosis and situation, designed to meet your specific needs.  With this comprehensive rehabilitation program, you will be expected to participate in at least 3 hours of rehabilitation therapies Monday-Friday, with modified therapy programming on the weekends.  Your rehabilitation program will include the following services:  Physical Therapy (PT), Occupational Therapy (OT), Speech Therapy (ST), 24 hour per day rehabilitation nursing, Therapeutic Recreaction (TR), Neuropsychology, Care Coordinator, Rehabilitation Medicine, Nutrition Services, Pharmacy Services, and Other  Weekly team conferences will be held on Wednesdays to discuss your progress.  Your Inpatient Rehabilitation Care Coordinator will talk with you frequently to get your input and to update you on team discussions.  Team conferences with you and your family in attendance may also be held.  Expected length of stay: 14-20 Days  Overall anticipated outcome: Supervision to Min A  Depending on your progress and recovery, your program may change. Your Inpatient Rehabilitation Care Coordinator will coordinate services and will keep you informed of any changes. Your Inpatient Rehabilitation Care Coordinator's name and contact numbers are listed  below.  The following services may also be recommended but are not provided by the Inpatient Rehabilitation Center:   Home Health Rehabiltiation Services Outpatient Rehabilitation Services    Arrangements will be made to provide these services after discharge if needed.  Arrangements include referral to agencies that provide these services.  Your insurance has been verified to be:   [facility] Medicare Your primary doctor is:  [name], PA-C  Pertinent  information will be shared with your doctor and your insurance company.  Inpatient Rehabilitation Care Coordinator:  Lavera Guise, [place] [phone] or [phone]  Information discussed with and copy given to patient by: Andria Rhein, 09/28/2022, 10:34 AM

## 2022-09-28 NOTE — Progress Notes (Signed)
Patient ID: Charles Holmes, male   DOB: 1965/12/23, 57 y.o.   MRN: [id number]  SW made attempt to assess patient. Patient asleep. Sw will continue to follow up.

## 2022-09-28 NOTE — Progress Notes (Addendum)
Patient ID: Charles Holmes, male   DOB: [birth year], 57 y.o.   MRN: [id number]     Advanced Heart Failure Rounding Note  PCP-Cardiologist: Charles Herrlich, MD  [facility]: Dr. Gala Holmes   Subjective:    Admitted to CIR on 09/11  Seen while working with OT.  Sitting on side of bed. Fatigued but no other complaints.  Appetite much improved.  No further hallucinations.  MAP 80s-90s.  INR 1.7.   LVAD Interrogation HM III: Speed: 5400  Flow: 4.0  PI: 4 Power: 4 PI event so far this am. VAD interrogated personally. Parameters stable.  Objective:   Weight Range: 79.3 kg Body mass index is 27.38 kg/m.   Vital Signs:   Temp:  [98.1 F (36.7 C)-98.6 F (37 C)] 98.6 F (37 C) (09/12 0515) Pulse Rate:  [74-92] 92 (09/12 0851) Resp:  [15-18] 17 (09/12 0515) BP: (94-121)/(75-86) 121/80 (09/12 0908) SpO2:  [93 %-99 %] 99 % (09/12 0515) FiO2 (%):  [21 %] 21 % (09/11 2043) Weight:  [79.3 kg-80.1 kg] 79.3 kg (09/12 0500) Last BM Date : 09/26/22  Weight change: Filed Weights   09/27/22 1636 09/27/22 1831 09/28/22 0500  Weight: 80.1 kg 80.1 kg 79.3 kg   Intake/Output:   Intake/Output Summary (Last 24 hours) at 09/28/2022 1123 Last data filed at 09/28/2022 0850 Gross per 24 hour  Intake 778 ml  Output 1200 ml  Net -422 ml      Physical Exam   Physical Exam: GENERAL: Well appearing. HEENT: normal  NECK: Supple, no JVD.  CARDIAC:  Mechanical heart sounds with LVAD hum present.  LUNGS:  Clear to auscultation bilaterally.  ABDOMEN:  Soft, round, nontender, positive bowel sounds x4.     LVAD exit site: Dressing dry and intact.  No erythema or drainage.  Stabilization device present and accurately applied.  EXTREMITIES:  Warm and dry, no cyanosis, clubbing, rash or edema  NEUROLOGIC:  Alert and oriented x 4.  Affect pleasant.   Left-sided weakness   Labs    CBC Recent Labs    09/26/22 0223 09/27/22 0226  WBC 9.1 9.7  HGB 9.2* 9.5*  HCT 30.5* 32.6*  MCV 92.1 94.2  PLT 283  307    Basic Metabolic Panel Recent Labs    08/65/78 0226 09/28/22 0808  NA 139 138  K 4.1 3.9  CL 102 105  CO2 25 25  GLUCOSE 113* 127*  BUN 12 8  CREATININE 0.70 0.62  CALCIUM 8.5* 8.7*  MG 2.0 1.8    BNP: BNP (last 3 results) Recent Labs    09/11/22 2252 09/18/22 2246 09/25/22 2351  BNP 933.0* 809.0* 851.4*     Imaging    [place] EKG SITE RITE  Result Date: 09/28/2022 If Site Rite image not attached, placement could not be confirmed due to current cardiac rhythm.    Medications:     Scheduled Medications:  aspirin EC  81 mg Oral Daily   atorvastatin  80 mg Oral Daily   digoxin  0.125 mg Oral Daily   dorzolamide-timolol  1 drop Both Eyes BID   feeding supplement  237 mL Oral TID BM   Gerhardt's butt cream   Topical TID   influenza vac split trivalent PF  0.5 mL Intramuscular Tomorrow-1000   latanoprost  1 drop Both Eyes QHS   melatonin  3 mg Oral QHS   mexiletine  250 mg Oral BID   midodrine  5 mg Oral Q8H   mometasone-formoterol  2 puff Inhalation  BID   multivitamin with minerals  1 tablet Oral Daily   pantoprazole  40 mg Oral Daily   potassium chloride  20 mEq Oral Once   sertraline  50 mg Oral Daily   sildenafil  20 mg Oral TID   umeclidinium bromide  1 puff Inhalation Daily   warfarin  2.5 mg Oral ONCE-1600   Warfarin - Pharmacist Dosing Inpatient   Does not apply q1600    Infusions:  sodium chloride 250 mL (09/28/22 0903)   ampicillin (OMNIPEN) IV 2 g (09/28/22 1054)   cefTRIAXone (ROCEPHIN)  IV 2 g (09/28/22 0906)   magnesium sulfate bolus IVPB      PRN Medications: sodium chloride, acetaminophen, albuterol, alum & mag hydroxide-simeth, bisacodyl, guaiFENesin-dextromethorphan, ondansetron **OR** ondansetron (ZOFRAN) IV, mouth rinse, polyethylene glycol, sodium chloride, sodium chloride flush, sodium phosphate, traMADol   Patient Profile  [name] is a 56 y.o. 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 Admitted with cardiogenic shock, stabilized and underwent HM3 LVAD. Post implant course c/b acute CVA and enterococcus faecalis bacteremia.   Assessment/Plan  1.  Acute on chronic Systolic HF-->Cardiogenic Shock  - Diagnosed 11/2019. Presented with VT. LHC 70% LAD  - cMRI 12/21 concerning for sarcoid and EF 18%.  - PET 2/22 at [facility] EF 25% + active sarcoid - Echo 08/26/20 EF < 20% severely dilated LV RV mildly decreased.  - [employer] CRT-D upgrade in 06/08/21 - Echo 07/10/22: EF <20%, RV okay, mod pericardial effusion, mod MR/TR - Admitted 07/25 with cardiogenic shock. - RHC: Nonobstructive CAD, severely elevated filling pressures and low Fick CO/CI (2.7/1.4) - 08/13 HM III LVAD implant + clipping LAA d/t severe thickening and invagination of mitral valve annulus impeding flows.  - Apical core sent - no mention of sarcoid - Speed increased to 5300 on 08/14.  - Echo 8/26 mod-sev RV dysfunction. Speed increased to 5400 8/26 - RHC 8/27: low filing pressures with excellent cardiac output on EPI, PAPi 4.6 - Ramp Echo 9/4, speed increased to 5400  - MAPs 80s-90s. Stop midodrine. - Volume stable. Lasix PRN. - Continue digoxin 0.125 mg daily  2. HM-3 LVAD - VAD interrogated personally. Parameters stable. - LDH stable - Continue ASA 81 - INR 1.7. Suspect d/t change in diet.  Discussed Warfarin dosing with PharmD personally.  3.  Acute stroke - Hx CVA 06/24 -Admitted 06/24 w/ R MCA stroke. S/p TPA and mechanical clot extraction. No residual deficits. Likely cardioembolic in setting of severe LV dysfunction. - Developed left sided weakness 08/14. CTA with R MCA infarct. Taken to IR for thrombectomy - Repeat CT head with small to moderate size hemorrhagic conversion.  - repeat head CT on 8/17 w/ improved hemorrhagic CVA - Back on warfarin. INR 1.7. - neurology signed off  - Continue PT/OT - Continue ASA 81 mg daily - Appreciate CIR.  4. Enterococcus  faecalis bacteremia - Bcx 2/2 on 9/6 - ID consult 9/6 -> ampicillin and ceftriaxone - TEE recommended but doubt it will change management as VAD cannot be removed and will require IV abx followed by long-term suppression - Echo 09/10 - no obvious vegetations - PICC out - Repeat BCx 9/8 NGTD. - ID recommending 6 weeks IV amp/ceftriaxone followed by long-term suppression with amoxicillin. Will need to place PICC line if discharges home before he completes course of IV abx.  5. Hx VT - ln setting of potential sarcoid heart disease  -  Off amio due to tremor. Continue mexiletine  - now s/p ICD.   6. CAD - LHC 12/07/19 70-% LAD, no intervention - LHC 8/24 non obstructive CAD.  - Continue statin. On aspirin for VAD. - No s/s angina  7. 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  - holding methotrexate w/ recent surgery and active infection, can discuss timing of restarting the medication at outpatient follow-up - apical core pathology not diagnostic of cardiac sarcoidosis.   8. Paroxsymal AT/AF - Rhythm regular - On Warfarin   9. AKI - suspect cardiorenal, improved w/ inotropic support - Creatinine stable now   10. Iron deficiency anemia/ Post-op anemia - recent T sat 15%, scheduled for OP feraheme. Will complete inpatient  - Transfused 1 u RBCs 8/15 - Hgb at 9.5   11. Pulmonary  - PFTs with severe obstructive defect, response to bronchodilator. FEV1 1.04L, FEV1/FVC 48% - Stable  12. Psych - Depression and delirium post VAD implant. Prolonged hospital course.  - Improving - Continue sertraline   Anna Genre, PA-C 11:23 AM

## 2022-09-28 NOTE — Plan of Care (Signed)
  Problem: Consults Goal: RH STROKE PATIENT EDUCATION Description: See Patient Education module for education specifics  Outcome: Progressing   Problem: RH BOWEL ELIMINATION Goal: RH STG MANAGE BOWEL W/MEDICATION W/ASSISTANCE Description: STG Manage Bowel with Medication with min Assistance. Outcome: Progressing   Problem: RH BLADDER ELIMINATION Goal: RH STG MANAGE BLADDER WITH ASSISTANCE Description: STG Manage Bladder With min Assistance Outcome: Progressing   Problem: RH SKIN INTEGRITY Goal: RH STG SKIN FREE OF INFECTION/BREAKDOWN Description: Incisions and skin tears will continue to heal and be free of infection/breakdown with min assist  Outcome: Progressing Goal: RH STG MAINTAIN SKIN INTEGRITY WITH ASSISTANCE Description: STG Maintain Skin Integrity With min Assistance. Outcome: Progressing Goal: RH STG ABLE TO PERFORM INCISION/WOUND CARE W/ASSISTANCE Description: STG Able To Perform Incision/Wound Care With min Assistance. Outcome: Progressing   Problem: RH SAFETY Goal: RH STG ADHERE TO SAFETY PRECAUTIONS W/ASSISTANCE/DEVICE Description: STG Adhere to Safety Precautions With cueing Assistance/Device. Outcome: Progressing Goal: RH STG DECREASED RISK OF FALL WITH ASSISTANCE Description: STG Decreased Risk of Fall With cueing Assistance. Outcome: Progressing   Problem: RH COGNITION-NURSING Goal: RH STG ANTICIPATES NEEDS/CALLS FOR ASSIST W/ASSIST/CUES Description: STG Anticipates Needs/Calls for Assist With cueing Assistance/Cues. Outcome: Progressing   Problem: RH PAIN MANAGEMENT Goal: RH STG PAIN MANAGED AT OR BELOW PT'S PAIN GOAL Description: Less than 4 with PRN medications min assist  Outcome: Progressing   Problem: RH KNOWLEDGE DEFICIT Goal: RH STG INCREASE KNOWLEGDE OF HYPERLIPIDEMIA Description: Patient will be able to manage cholesterol medication and improve cholesterol levels from diet/lifestyle modifications from nursing education and nursing handouts  independently  Outcome: Progressing Goal: RH STG INCREASE KNOWLEDGE OF STROKE PROPHYLAXIS Description: Patient/caregiver will be able to manage stroke medications from nursing education and nursing handouts independently  Outcome: Progressing   Problem: Education: Goal: Understanding of CV disease, CV risk reduction, and recovery process will improve Outcome: Progressing Goal: Individualized Educational Video(s) Outcome: Progressing   Problem: Activity: Goal: Ability to return to baseline activity level will improve Outcome: Progressing   Problem: Cardiovascular: Goal: Ability to achieve and maintain adequate cardiovascular perfusion will improve Outcome: Progressing Goal: Vascular access site(s) Level 0-1 will be maintained Outcome: Progressing   Problem: Health Behavior/Discharge Planning: Goal: Ability to safely manage health-related needs after discharge will improve Outcome: Progressing   Problem: Education: Goal: Knowledge of disease or condition will improve Outcome: Progressing Goal: Knowledge of secondary prevention will improve (MUST DOCUMENT ALL) Outcome: Progressing Goal: Knowledge of patient specific risk factors will improve Loraine Leriche N/A or DELETE if not current risk factor) Outcome: Progressing   Problem: Ischemic Stroke/TIA Tissue Perfusion: Goal: Complications of ischemic stroke/TIA will be minimized Outcome: Progressing   Problem: Coping: Goal: Will verbalize positive feelings about self Outcome: Progressing Goal: Will identify appropriate support needs Outcome: Progressing   Problem: Health Behavior/Discharge Planning: Goal: Ability to manage health-related needs will improve Outcome: Progressing Goal: Goals will be collaboratively established with patient/family Outcome: Progressing   Problem: Self-Care: Goal: Ability to participate in self-care as condition permits will improve Outcome: Progressing Goal: Verbalization of feelings and concerns  over difficulty with self-care will improve Outcome: Progressing Goal: Ability to communicate needs accurately will improve Outcome: Progressing   Problem: Nutrition: Goal: Risk of aspiration will decrease Outcome: Progressing Goal: Dietary intake will improve Outcome: Progressing

## 2022-09-28 NOTE — Plan of Care (Signed)
  Problem: RH Expression Communication Goal: LTG Patient will increase speech intelligibility (SLP) Description: LTG: Patient will increase speech intelligibility at word/phrase/conversation level with cues, % of the time (SLP) Flowsheets (Taken 09/28/2022 1257) LTG: Patient will increase speech intelligibility (SLP): Modified Independent   Problem: RH Problem Solving Goal: LTG Patient will demonstrate problem solving for (SLP) Description: LTG:  Patient will demonstrate problem solving for basic/complex daily situations with cues  (SLP) Flowsheets (Taken 09/28/2022 1257) LTG: Patient will demonstrate problem solving for (SLP): Complex daily situations LTG Patient will demonstrate problem solving for: Supervision   Problem: RH Memory Goal: LTG Patient will use memory compensatory aids to (SLP) Description: LTG:  Patient will use memory compensatory aids to recall biographical/new, daily complex information with cues (SLP) Flowsheets (Taken 09/28/2022 1257) LTG: Patient will use memory compensatory aids to (SLP): Modified Independent

## 2022-09-28 NOTE — Progress Notes (Signed)
Resting without complains or discomfort , states he is tired, denies pain,.LVAD functioning appropriately, monitor closely, no acute distress or discomfort..Patient request purewick and applied for comfort so that he can rest during the nigh.Left hand with 2+ edema, elevated on 2 pillows, attempts to reposition, bur patient request to allow him to rest/sleep on his back, HOB elevated, no acute respiratory discomfort or distress noted.Monitor and assisted IV Antx infusing and times adjusted with pharmacy per request of writer.

## 2022-09-28 NOTE — Progress Notes (Signed)
PROGRESS NOTE   Subjective/Complaints: Pt without issues this morning. Still feels weak on left side.   ROS: Patient denies fever, rash, sore throat, blurred vision, dizziness, nausea, vomiting, diarrhea, cough, shortness of breath or chest pain, joint or back/neck pain, headache, or mood change.    Objective:   No results found. Recent Labs    09/26/22 0223 09/27/22 0226  WBC 9.1 9.7  HGB 9.2* 9.5*  HCT 30.5* 32.6*  PLT 283 307   Recent Labs    09/27/22 0226 09/28/22 0808  NA 139 138  K 4.1 3.9  CL 102 105  CO2 25 25  GLUCOSE 113* 127*  BUN 12 8  CREATININE 0.70 0.62  CALCIUM 8.5* 8.7*    Intake/Output Summary (Last 24 hours) at 09/28/2022 1033 Last data filed at 09/28/2022 0850 Gross per 24 hour  Intake 660 ml  Output 1200 ml  Net -540 ml        Physical Exam: Vital Signs Blood pressure 121/80, pulse 92, temperature 98.6 F (37 C), temperature source Oral, resp. rate 17, height 5\' 7"  (1.702 m), weight 79.3 kg, SpO2 99%.  General: Alert and oriented x 3, No apparent distress HEENT: Head is normocephalic, atraumatic, PERRLA, EOMI, sclera anicteric, oral mucosa pink and moist, dentition intact, ext ear canals clear,  Neck: Supple without JVD or lymphadenopathy Heart:  LVAD hum Chest: CTA bilaterally without wheezes, rales, or rhonchi; no distress Abdomen: Soft, non-tender, non-distended, bowel sounds positive. Extremities: No clubbing, cyanosis, or edema. Pulses are 2+ Psych: Pt's affect is appropriate. Pt is cooperative Skin: chest incisions clean, LVAD drive site clean/dressed Neuro:  Alert and oriented x 3. Fair insight and awareness. Intact Memory. Normal language and speech. Cranial nerve exam unremarkable except for mild left central 7. MMT: LUE grossly 3- to 3/5 prox to distal. LLE 3 to 3+. Pt with decreased LT/pain sense on left. No abnl resting tone. DTR's tr to1+.   Musculoskeletal: Full ROM,  No pain with AROM or PROM in the neck, trunk, or extremities. Posture appropriate     Assessment/Plan: 1. Functional deficits which require 3+ hours per day of interdisciplinary therapy in a comprehensive inpatient rehab setting. Physiatrist is providing close team supervision and 24 hour management of active medical problems listed below. Physiatrist and rehab team continue to assess barriers to discharge/monitor patient progress toward functional and medical goals  Care Tool:  Bathing              Bathing assist       Upper Body Dressing/Undressing Upper body dressing        Upper body assist      Lower Body Dressing/Undressing Lower body dressing            Lower body assist       Toileting Toileting    Toileting assist       Transfers Chair/bed transfer  Transfers assist           Locomotion Ambulation   Ambulation assist              Walk 10 feet activity   Assist           Walk  50 feet activity   Assist           Walk 150 feet activity   Assist           Walk 10 feet on uneven surface  activity   Assist           Wheelchair     Assist               Wheelchair 50 feet with 2 turns activity    Assist            Wheelchair 150 feet activity     Assist          Blood pressure 121/80, pulse 92, temperature 98.6 F (37 C), temperature source Oral, resp. rate 17, height 5\' 7"  (1.702 m), weight 79.3 kg, SpO2 99%.  Medical Problem List and Plan: 1. Functional deficits secondary to acute right MCA infarct/ LVAD pt             -patient may shower             -ELOS/Goals: modI/S 10-14 days            -Patient is beginning CIR therapies today including PT and OT, SLP   2.  Antithrombotics: -DVT/anticoagulation:  Pharmaceutical: Coumadin             -antiplatelet therapy: Aspirin 81 mg daily   3. Pain Management: Tylenol as needed   4. Mood/Behavior/Sleep: delirium/insomnia -  team to provide ongoing ego support             -continue mirtazapine 7.5 mg q HS             -continue sertraline 50 mg daily             -continue melatonin 3 mg q HS             -antipsychotic agents: n/a   5. Neuropsych/cognition: This patient is capable of making decisions on his own behalf.   6. Skin/Wound Care: Routine skin care checks   7. Fluids/Electrolytes/Nutrition: strict Is and Os and follow-up chemistries             -poor PO intake on Marinol             -continue supplements   -9/12 labs reviewed. low albumin still continue interventions above 8: Hypertension: monitor TID and prn   9: Hyperlipidemia: continue statin   10: Enterococcus bacteremia: 6 weeks IV antibiotic, ceftriaxone and ampicillin             -follow-up with ID  -needs PICC   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: Home meds: Coreg 3.125, Lasix 40mg , Lostartan 25mg , Aldactone 12.5, Farxiga 10mg , mexilitene    12: Acute on chronic systolic heart failure with cardiogenic shock s/p LVAD             -daily weights balanced so far             -continue digoxin 0.125 mg daily             -continue mexiletine 250 mg BID             -continue midodrine 5 mg q 8 hours             -continue sildenafil 20 mg q 8 hours             -continue warfarin per pharmacy    Filed Weights   09/27/22 1636 09/27/22 1831 09/28/22 0500  Weight: 80.1 kg  80.1 kg 79.3 kg    13: Hx of VT: continue mexiletine, digoxin; has ICD   14: DM-2: A1c = 5.5%; monitor PO intake; DC SSI (Farxiga at home)   15: OSA: needs outpatient sleep study   16: Anemia, iron deficiency: follow-up CBC--most recent hgb 9.5--lab pending still today?   17: Severe COPD:              -continue Ellipta one puff daily   18: s/p tooth extractions this admission: monitor PO tolerance   19: Glaucoma: continue Cosopt and Xalatan    LOS: 1 days A FACE TO FACE EVALUATION WAS PERFORMED  Ranelle Oyster 09/28/2022, 10:33 AM

## 2022-09-28 NOTE — Plan of Care (Signed)
  Problem: RH Balance Goal: LTG Patient will maintain dynamic standing with ADLs (OT) Description: LTG:  Patient will maintain dynamic standing balance with assist during activities of daily living (OT)  Flowsheets (Taken 09/28/2022 1702) LTG: Pt will maintain dynamic standing balance during ADLs with: Contact Guard/Touching assist   Problem: RH Eating Goal: LTG Patient will perform eating w/assist, cues/equip (OT) Description: LTG: Patient will perform eating with assist, with/without cues using equipment (OT) Flowsheets (Taken 09/28/2022 1702) LTG: Pt will perform eating with assistance level of: Set up assist    Problem: RH Grooming Goal: LTG Patient will perform grooming w/assist,cues/equip (OT) Description: LTG: Patient will perform grooming with assist, with/without cues using equipment (OT) Flowsheets (Taken 09/28/2022 1702) LTG: Pt will perform grooming with assistance level of: Set up assist    Problem: RH Bathing Goal: LTG Patient will bathe all body parts with assist levels (OT) Description: LTG: Patient will bathe all body parts with assist levels (OT) Flowsheets (Taken 09/28/2022 1702) LTG: Pt will perform bathing with assistance level/cueing: Contact Guard/Touching assist   Problem: RH Dressing Goal: LTG Patient will perform upper body dressing (OT) Description: LTG Patient will perform upper body dressing with assist, with/without cues (OT). Flowsheets (Taken 09/28/2022 1702) LTG: Pt will perform upper body dressing with assistance level of: Supervision/Verbal cueing Goal: LTG Patient will perform lower body dressing w/assist (OT) Description: LTG: Patient will perform lower body dressing with assist, with/without cues in positioning using equipment (OT) Flowsheets (Taken 09/28/2022 1702) LTG: Pt will perform lower body dressing with assistance level of: Contact Guard/Touching assist   Problem: RH Toileting Goal: LTG Patient will perform toileting task (3/3 steps) with  assistance level (OT) Description: LTG: Patient will perform toileting task (3/3 steps) with assistance level (OT)  Flowsheets (Taken 09/28/2022 1702) LTG: Pt will perform toileting task (3/3 steps) with assistance level: Minimal Assistance - Patient > 75%   Problem: RH Functional Use of Upper Extremity Goal: LTG Patient will use RT/LT upper extremity as a (OT) Description: LTG: Patient will use right/left upper extremity as a stabilizer/gross assist/diminished/nondominant/dominant level with assist, with/without cues during functional activity (OT) Flowsheets (Taken 09/28/2022 1702) LTG: Use of upper extremity in functional activities: LUE as gross assist level LTG: Pt will use upper extremity in functional activity with assistance level of: Supervision/Verbal cueing   Problem: RH Toilet Transfers Goal: LTG Patient will perform toilet transfers w/assist (OT) Description: LTG: Patient will perform toilet transfers with assist, with/without cues using equipment (OT) Flowsheets (Taken 09/28/2022 1702) LTG: Pt will perform toilet transfers with assistance level of: Contact Guard/Touching assist

## 2022-09-28 NOTE — Evaluation (Signed)
Physical Therapy Assessment and Plan  Patient Details  Name: Charles Holmes MRN: [id number] Date of Birth: Feb 24, 1965  PT Diagnosis: Abnormality of gait, Coordination disorder, Difficulty walking, Hemiplegia, and Impaired cognition Rehab Potential: Fair ELOS: 2-3 weeks   Today's Date: 09/28/2022 PT Individual Time: 1300-1408 PT Individual Time Calculation (min): 68 min    Hospital Problem: Principal Problem:   Acute right MCA stroke (HCC)   Past Medical History:  Past Medical History:  Diagnosis Date   CAD (coronary artery disease)    CHF (congestive heart failure) (HCC)    COPD (chronic obstructive pulmonary disease) (HCC)    GERD (gastroesophageal reflux disease)    Hyperlipidemia    Hypertension    LVAD (left ventricular assist device) present (HCC)    Stroke (HCC)    Systolic heart failure (HCC) 2021   LVEF 18%, RVEF 38% on cardiac MRI 12/19/2019. possible cardiac sarcoidosis.   Wide-complex tachycardia 2021   wears LifeVest   Past Surgical History:  Past Surgical History:  Procedure Laterality Date   BIV UPGRADE N/A 06/07/2021   Procedure: BIV ICD UPGRADE;  Surgeon: Regan Lemming, MD;  Location: [facility] INVASIVE CV LAB;  Service: Cardiovascular;  Laterality: N/A;   CLIPPING OF ATRIAL APPENDAGE Left 08/29/2022   Procedure: CLIPPING OF ATRIAL APPENDAGE;  Surgeon: Lovett Sox, MD;  Location: [facility] OR;  Service: Open Heart Surgery;  Laterality: Left;   IABP INSERTION N/A 08/25/2022   Procedure: IABP Insertion;  Surgeon: Dolores Patty, MD;  Location: [facility] INVASIVE CV LAB;  Service: Cardiovascular;  Laterality: N/A;   ICD IMPLANT N/A 02/20/2020   Procedure: ICD IMPLANT;  Surgeon: Regan Lemming, MD;  Location: [facility] INVASIVE CV LAB;  Service: Cardiovascular;  Laterality: N/A;   INSERTION OF IMPLANTABLE LEFT VENTRICULAR ASSIST DEVICE N/A 08/29/2022   Procedure: INSERTION OF IMPLANTABLE LEFT VENTRICULAR ASSIST DEVICE;  Surgeon: Lovett Sox, MD;  Location: [facility] OR;   Service: Open Heart Surgery;  Laterality: N/A;   IR CT HEAD LTD  07/10/2022   IR CT HEAD LTD  08/30/2022   IR PERCUTANEOUS ART THROMBECTOMY/INFUSION INTRACRANIAL INC DIAG ANGIO  07/10/2022   IR PERCUTANEOUS ART THROMBECTOMY/INFUSION INTRACRANIAL INC DIAG ANGIO  08/30/2022   IR US GUIDE VASC ACCESS LEFT  08/30/2022   IR US GUIDE VASC ACCESS RIGHT  07/10/2022   RADIOLOGY WITH ANESTHESIA N/A 07/10/2022   Procedure: RADIOLOGY WITH ANESTHESIA;  Surgeon: Radiologist, Medication, MD;  Location: [facility] OR;  Service: Radiology;  Laterality: N/A;   RADIOLOGY WITH ANESTHESIA N/A 08/30/2022   Procedure: IR WITH ANESTHESIA;  Surgeon: Julieanne Cotton, MD;  Location: [facility] OR;  Service: Radiology;  Laterality: N/A;   REMOVAL OF IMPELLA LEFT VENTRICULAR ASSIST DEVICE N/A 08/29/2022   Procedure: REMOVAL OF INTRA-AORTIC BALLON PUMP;  Surgeon: Lovett Sox, MD;  Location: [facility] OR;  Service: Open Heart Surgery;  Laterality: N/A;   RIGHT HEART CATH N/A 07/14/2022   Procedure: RIGHT HEART CATH;  Surgeon: Dolores Patty, MD;  Location: [facility] INVASIVE CV LAB;  Service: Cardiovascular;  Laterality: N/A;   RIGHT HEART CATH N/A 08/25/2022   Procedure: RIGHT HEART CATH;  Surgeon: Dolores Patty, MD;  Location: [facility] INVASIVE CV LAB;  Service: Cardiovascular;  Laterality: N/A;   RIGHT HEART CATH N/A 09/12/2022   Procedure: RIGHT HEART CATH;  Surgeon: Laurey Morale, MD;  Location: [facility] INVASIVE CV LAB;  Service: Cardiovascular;  Laterality: N/A;   RIGHT/LEFT HEART CATH AND CORONARY ANGIOGRAPHY N/A 12/16/2019   Procedure: RIGHT/LEFT HEART CATH AND CORONARY ANGIOGRAPHY;  Surgeon: [place], Peter M, MD;  Location[facility] INVAS[facility] CV LAB;  Service: Cardiovascular;  Laterality: N/A;   RIGHT/LEFT HEART CATH AND CORONARY ANGIOGRAPHY N/A 08/10/2022   Procedure: RIGHT/LEFT HEART CATH AND CORONARY ANGIOGRAPHY;  Surgeon: Dolores Patty, MD;  Location: [facility] INVASIVE CV LAB;  Service: Cardiovascular;  Laterality: N/A;   TEE WITHOUT CARDIOVERSION N/A  08/29/2022   Procedure: TRANSESOPHAGEAL ECHOCARDIOGRAM;  Surgeon: Lovett Sox, MD;  Location: [facility] OR;  Service: Open Heart Surgery;  Laterality: N/A;   TOOTH EXTRACTION N/A 08/22/2022   Procedure: DENTAL RESTORATION/EXTRACTIONS;  Surgeon: Ocie Doyne, DMD;  Location: [facility] OR;  Service: Oral Surgery;  Laterality: N/A;    Assessment & Plan Clinical Impression: Patient is a 57 y.o. year old male treated at [facility] in 2021 for CAD and heart failure now with LVAD. On the day of admission, he was evaluated at the heart failure outpatient clinic, he was found with worsening heart failure and was referred to the ED for further evaluation. Admitted on 08/10/2022 on HF team service. 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 At baseline, mild asterixis bilaterally and slight diminished fine finger movements on the left.    Underwent R/L cardiac cath showed low cardiac output and filling pressures. PFT/pulmonary medicine evaluation due to severe COPD. Started advanced therapies due to very labile volume status with any decreases in milrinone or diuretics consistent with stage D heart failure. Dental surgery consulted 8/05 and underwent remaining tooth extractions (#13) on 8/06. Back to cath lab and IABP placed on 8/09 for ongoing cardiogenic shock. Head CT performed on 8/17 showed interval evolution of the right MCA territory infarct with decreased intraparenchymal and intra ventricular hemorrhage. No hydrocephalus or midline shift. LVAD (HeartMate 3) placed 8/13. New left sided weakness noted on 8/14 and code stroke activated. Seen by Dr. Derry Lory. CTA with R MCA M1 occlusion. CTP with a core infarct of 12cc and penumbra of 90cc. Code IR activated and patient taken for mechanical  thrombectomy. Extubated on 8/15. Oriented and language fluent with mild dysarthria on 8/18. Left facial droop and unable to lift LLE against gravity. Remained on heparin IV. Course complicated by Enterococcus bacteremia on 9/06 and ID consulted. Will remain on IV antibiotics through 11/04/2022. PO intake not yet optimal. On regular diet. Now on aspirin and coumadin. Delirium persists with hallucinations. The patient requires inpatient medicine and rehabilitation evaluations and services for ongoing dysfunction secondary to heart failure and right MCA stroke.   Patient transferred to CIR on 09/27/2022 .   Patient currently requires mod with mobility secondary to muscle weakness and muscle joint tightness, decreased cardiorespiratoy endurance, decreased coordination and decreased motor planning, decreased initiation, decreased attention, decreased awareness, decreased problem solving, and delayed processing, and decreased standing balance, decreased postural control, hemiplegia, and decreased balance strategies.  Prior to hospitalization, patient was independent  with mobility and lived with Alone, Other (Comment) (d/c to cousins) in a Mobile home home.  Home access is 1Stairs to enter.  Patient will benefit from skilled PT intervention to maximize safe functional mobility, minimize fall risk, and decrease caregiver burden for planned discharge home with 24 hour supervision.  Anticipate patient will benefit from follow up HH at discharge.  PT - End of Session Activity Tolerance: Tolerates 10 - 20 min activity with multiple rests  Endurance Deficit: Yes Endurance Deficit Description: decreased PT Assessment Rehab Potential (ACUTE/IP ONLY): Fair PT Barriers to Discharge: Home environment access/layout PT Barriers to Discharge Comments: discharge to cousins home PT Patient demonstrates impairments in the following area(s): Balance;Nutrition;Skin Integrity;Edema;Endurance;Motor;Perception PT Transfers  Functional Problem(s): Bed Mobility;Bed to Chair;Car PT Locomotion Functional Problem(s): Ambulation;Wheelchair Mobility PT Plan PT Intensity: Minimum of 1-2 x/day ,45 to 90 minutes PT Frequency: 5 out of 7 days PT Duration Estimated Length of Stay: 2-3 weeks PT Treatment/Interventions: Ambulation/gait training;Cognitive remediation/compensation;Discharge planning;DME/adaptive equipment instruction;Functional mobility training;Psychosocial support;Splinting/orthotics;Therapeutic Activities;UE/LE Strength taining/ROM;UE/LE Coordination activities;Therapeutic Exercise;Stair training;Patient/family education;Neuromuscular re-education;Disease management/prevention;Community reintegration;Balance/vestibular training PT Transfers Anticipated Outcome(s): supervsion PT Locomotion Anticipated Outcome(s): CGA PT Recommendation Recommendations for Other Services: Neuropsych consult;Therapeutic Recreation consult Therapeutic Recreation Interventions: Pet therapy Follow Up Recommendations: Home health PT Patient destination: Home Equipment Recommended: To be determined Equipment Details: no DME   PT Evaluation Precautions/Restrictions Precautions Precautions: Sternal;Fall Precaution Comments: LVAD Restrictions Weight Bearing Restrictions: Yes RUE Weight Bearing: Non weight bearing RLE Weight Bearing: Non weight bearing Other Position/Activity Restrictions: Sternal Precautions Pain Interference Pain Interference Pain Effect on Sleep: 0. Does not apply - I have not had any pain or hurting in the past 5 days Pain Interference with Therapy Activities: 0. Does not apply - I have not received rehabilitationtherapy in the past 5 days Pain Interference with Day-to-Day Activities: 1. Rarely or not at all Home Living/Prior Functioning Home Living Available Help at Discharge: Family;Available 24 hours/day Type of Home: Mobile home Home Access: Stairs to enter Entrance Stairs-Number of Steps:  1 Entrance Stairs-Rails: Right;Left Home Layout: One level Bathroom Shower/Tub: Health visitor: Standard Bathroom Accessibility: Yes  Lives With: Alone;Other (Comment) (d/c to cousins) Prior Function Level of Independence: Independent with gait;Independent with transfers  Able to Take Stairs?: Yes Driving: Yes Vocation: On disability Cognition Overall Cognitive Status: Impaired/Different from baseline Arousal/Alertness: Awake/alert Orientation Level: Oriented X4 Memory: Appears intact Awareness: Appears intact Problem Solving: Impaired Safety/Judgment: Appears intact Sensation Sensation Light Touch: Appears Intact Proprioception: Appears Intact Coordination Gross Motor Movements are Fluid and Coordinated: No Fine Motor Movements are Fluid and Coordinated: No Coordination and Movement Description: grossly uncoordinated 2/2 L hemi Finger Nose Finger Test: limited L UE 2/2 weakness and ROM deficits Heel Shin Test: WFL R LE, L LE limited due to ROM and strength deficits Motor  Motor Motor: Hemiplegia Motor - Skilled Clinical Observations: L hemi and LVAD   Trunk/Postural Assessment  Cervical Assessment Cervical Assessment: Exceptions to [facility] (forward head) Thoracic Assessment Thoracic Assessment: Exceptions to [facility] (rounded shoulders) Lumbar Assessment Lumbar Assessment: Exceptions to [facility] (posterior pelvic tilt) Postural Control Postural Control: Deficits on evaluation Protective Responses: decreased  Balance Balance Balance Assessed: Yes Static Sitting Balance Static Sitting - Balance Support: Bilateral upper extremity supported Static Sitting - Level of Assistance: 5: Stand by assistance (supervision) Dynamic Sitting Balance Dynamic Sitting - Balance Support: Bilateral upper extremity supported Dynamic Sitting - Level of Assistance: 5: Stand by assistance (supervision) Dynamic Sitting - Balance Activities: Lateral lean/weight shifting Extremity  Assessment  RLE Assessment RLE Assessment: Exceptions to [facility] RLE Strength Right Hip Flexion: 4+/5 Right Hip Extension: 4+/5 Right Hip ABduction: 4+/5 Right Hip ADduction: 4+/5 Right Knee Flexion: 4+/5 Right Knee Extension: 4+/5 Right Ankle Dorsiflexion: 4/5 Right Ankle Plantar Flexion: 4/5 LLE Assessment LLE Assessment: Exceptions to [facility] LLE Strength Left Hip Flexion: 3+/5 Left Hip Extension: 3+/5 Left Hip ABduction: 4-/5 Left Hip ADduction: 4-/5 Left Knee Flexion: 4-/5 Left Knee Extension: 4-/5 Left Ankle Dorsiflexion: 4/5 Left  Ankle Plantar Flexion: 4/5  Care Tool Care Tool Bed Mobility Roll left and right activity   Roll left and right assist level: Moderate Assistance - Patient 50 - 74%    Sit to lying activity   Sit to lying assist level: Minimal Assistance - Patient > 75%    Lying to sitting on side of bed activity   Lying to sitting on side of bed assist level: the ability to move from lying on the back to sitting on the side of the bed with no back support.: Moderate Assistance - Patient 50 - 74%     Care Tool Transfers Sit to stand transfer Sit to stand activity did not occur: Safety/medical concerns (fatigue) Sit to stand assist level: Moderate Assistance - Patient 50 - 74%    Chair/bed transfer Chair/bed transfer activity did not occur: Safety/medical concerns (fatigue) Chair/bed transfer assist level: Moderate Assistance - Patient 50 - 74%     Toilet transfer Toilet transfer activity did not occur: Safety/medical concerns (fatigue) Assist Level: Moderate Assistance - Patient 50 - 74%    Car transfer Car transfer activity did not occur: Safety/medical concerns (fatigue)        Care Tool Locomotion Ambulation Ambulation activity did not occur: Safety/medical concerns (fatigue)        Walk 10 feet activity Walk 10 feet activity did not occur: Safety/medical concerns (fatigue)       Walk 50 feet with 2 turns activity Walk 50 feet with 2 turns  activity did not occur: Safety/medical concerns      Walk 150 feet activity Walk 150 feet activity did not occur: Safety/medical concerns      Walk 10 feet on uneven surfaces activity Walk 10 feet on uneven surfaces activity did not occur: Safety/medical concerns (fatigue)      Stairs Stair activity did not occur: Safety/medical concerns (fatigue)        Walk up/down 1 step activity Walk up/down 1 step or curb (drop down) activity did not occur: Safety/medical concerns      Walk up/down 4 steps activity Walk up/down 4 steps activity did not occur: Safety/medical concerns      Walk up/down 12 steps activity Walk up/down 12 steps activity did not occur: Safety/medical concerns      Pick up small objects from floor Pick up small object from the floor (from standing position) activity did not occur: Safety/medical concerns (fatigue)      Wheelchair Is the patient using a wheelchair?: Yes   Wheelchair activity did not occur: Safety/medical concerns (fatigue)      Wheel 50 feet with 2 turns activity Wheelchair 50 feet with 2 turns activity did not occur: Safety/medical concerns    Wheel 150 feet activity Wheelchair 150 feet activity did not occur: Safety/medical concerns      Refer to Care Plan for Long Term Goals  SHORT TERM GOAL WEEK 1 PT Short Term Goal 1 (Week 1): Pt will require min A consisntely for bed mobility PT Short Term Goal 2 (Week 1): Pt will require mod A with sit to stand with LRAD PT Short Term Goal 3 (Week 1): Pt will require max A x 1 for gait x 10 ft with LRAD  Recommendations for other services: Neuropsych and Therapeutic Recreation  Pet therapy and Stress management  Skilled Therapeutic Intervention Mobility Bed Mobility Bed Mobility: Rolling Right;Rolling Left;Supine to Sit;Sit to Supine Rolling Right: Moderate Assistance - Patient 50-74% Rolling Left: Moderate Assistance - Patient 50-74% Supine to Sit: Moderate  Assistance - Patient 50-74% Sit to  Supine: Minimal Assistance - Patient > 75% Locomotion  Gait Ambulation: No Gait Gait: No Stairs / Additional Locomotion Stairs: No Wheelchair Mobility Wheelchair Mobility: No  Pt received semi-reclined in bed, agreeable to PT evaluation and treatment. Pt declines pain in session. Pt oriented to CIR policies and procedures. PT assessed pain interference, sensation, coordination, strength and balance. Pt largely limited 2/2 decreased activity tolerance and left hemiplegia. Pt able to recall strict sternal precautions but requires mod cues with activities to adhere. Pt mod A with bed mobility with log roll technique and close (S) for static/dynamic sitting balance. Pt able to tolerate sitting edge of bed ~10 minutes before requesting to return to supine due to fatigue. Pt attempted sit to stand but unable therefore pt mod A with lateral scooting along edge of bed and min A with sit to lying. Pt engaged in therapeutic exercises to improve strength, activity tolerance and coordination.  Pt performed circuit x 3 of following exercise:   1. Gluteal bridges 1 x 5   2. Bilateral SLR   Pt missed 7 minutes of skilled PT 2/2 fatigue and will attempt to make up missed minutes as able. Pt left semi-reclined in bed with all needs in reach and alarm on.    Discharge Criteria: Patient will be discharged from PT if patient refuses treatment 3 consecutive times without medical reason, if treatment goals not met, if there is a change in medical status, if patient makes no progress towards goals or if patient is discharged from hospital.  The above assessment, treatment plan, treatment alternatives and goals were discussed and mutually agreed upon: by patient  Priscille Kluver PT, DPT  09/28/2022, 2:04 PM

## 2022-09-28 NOTE — Progress Notes (Signed)
Inpatient Rehabilitation  Patient information reviewed and entered into eRehab system by Melissa M. Bowie, M.A., CCC/SLP, PPS Coordinator.  Information including medical coding, functional ability and quality indicators will be reviewed and updated through discharge.    

## 2022-09-28 NOTE — Progress Notes (Addendum)
LVAD Coordinator Rounding Note:  Admitted 08/10/22 due to acute on chronic CHF with cardiogenic shock. Milrinone dependent. Advance therapy workup completed, and pt deemed acceptable VAD candidate. Dental extractions completed 8/6. IABP placed 08/25/22.  HM 3 LVAD implanted on 08/29/22 by Dr Donata Clay under destination therapy criteria. 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 Result negative.  7/25 Admit with cardiogenic shock. Started milrinone and NE. 8/6 S/P 13 teeth extractions  8/9 IABP placed 8/13 S/p HM III LVAD implant + clipping left atrial appendage d/t severe thickening and invagination of mitral valve annulus impeding flows  CT Head 8/14 (initial) 1. 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 ASPECTS is 7. 2. No acute hemorrhage.  CT Angio Head/Neck 1. 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 2. Core infarct of 12 cc (somewhat underestimated compared to aspects) with 90 cc of penumbra. 3. Mild atherosclerosis.  Pt taken emergently to IR for percutaneous right common carotid arteriogram with thrombectomy. Revascularization achieved. Angio-seal closure device applied to left groin- clean, dry, and intact.   CT Head 8/15 @ 0450 Unchanged extent of infarct and hemorrhage in the right MCA distribution including small volume intraventricular clot. No hydrocephalus.  CT Head 8/17 @ 0701 Interval evolution of the right MCA territory infarct with decreased intraparenchymal and intraventricular hemorrhage. No hydrocephalus or midline shift.   Pt lying in bed this morning in good spirits. Plan to start therapy at 0915.  Blood cultures from 9/6 positive for Enterococcus Faecalis. Subsequent blood cultures NGTD. ID following recommending 6 weeks of IV Ampicillin and Ceftriaxone followed by chronic suppression with Amoxicillin.   VAD coordinator/nurses working with pt on changing power sources. He is unable to do this due  to his left sided weakness. Modification of his power cords was made in hopes he can grip the cords better. Has practice power cords for use by pt. See below for education documentation.   Pt may not shower at this time.   Vital signs: Temp: 98.6 HR: 74 Doppler Pressure: 82 Automatic BP: 94/76 (84) O2 Sat: 99% on 2L [place] Wt: 183.6>191.1>190.9>190.2>184.8>178.8>178.1>172.4>170.6>173.2>167.9>167.1>163.1>162.5>171.3>172.4>176.6> 180.6>175.1>174.8lbs   LVAD interrogation reveals:  Speed: 5400 Flow: 4.6 Power: 3.9 w PI: 3.4  Alarms: none Events: rare  Hematocrit: 32 Fixed speed: 5400 Low speed limit: 5100  Drive Line: Existing VAD dressing CDI. Drive line anchor secure. Pt denies fever or chills. Continue Monday/ Wednesday/Friday dressing changes. Next dressing change due 09/29/22 by VAD coordinator or nurse champion only.   Labs:  LDH trend: 596>441>165>384>345>312>312>350>327>309>316>305>298>277>273>298>280>285>251>252  INR trend: 1.4>1.6>1.1>1.1>1.3>1.2>1>1.3>1.4>1.5>1.6>2.8>2.8>2.3>2.2>2.3>2.6>2.6>1.7  AST/ALT trend: 218/45>96/30>73/23>63/36>58/37>69/50>93/72>123/103>143/150>117/138>119/138>92/127>87/123>50/71>46/65>46/43  Total Bili trend: 4.4>4.6>5.1>1.8>1.9>1.2>1.0>1.5>1.1>0.8>0.9>1.0>0.8  WBC trend: 11.6>13.7>19.6>16.1>23>26.6>21>24.6>18.1>16.5>13.2>13.1>12.5>11.6>10.5>10.8>13.1>9.5>9.1>9.7  Anticoagulation Plan: -INR Goal: 2.0 - 2.5 -ASA Dose: 81 mg  - Coumadin dosing per pharmacy  Blood Products:  IntraOp 8/14: - 4 FFP - 2 Platelets - 2 PRBC - 1 cyro - 449 cc of cellsaver - DDAVP 20 mcg x 1   Device: [employer] BiV -Therapies: OFF; [employer] rep contacted to turn therapies back on post-surgery  Arrythmias:   Respiratory:   Infection:  09/01/22>> sputum cx>> NGTD>>final 09/01/22>> blood cx>> NGTD>>final 09/01/22>> urine cx>> NGTD>> final 09/13/22>>blood cxs>>no growth 5 days; final 09/22/22>>blood cxs>> Enterococcus faecalis  09/23/22>> blood cxs>>  pending 09/24/22>> blood cxs>> pending  Renal:  9/12: BUN/CRT: 8/0.62  Adverse Events on VAD: - 08/30/22: - Developed left sided weakness this am. CTA with R MCA infarct. Taken to IR for thrombectomy   Drips:    Patient Education: Discharge teaching  completed with pt's [name] on 9/5. See separate note for documentation.   Plan/Recommendations:  Please page VAD coordinator for any alarms or VAD equipment issues. Continue MWF dressing changes by VAD coordinator or Nurse Alla Feeling.  Simmie Davies RN,BSN VAD Coordinator  Office: [phone]  24/7 Pager: [phone]

## 2022-09-28 NOTE — Discharge Instructions (Addendum)
Inpatient Rehab Discharge Instructions  Charles Holmes Discharge date and time: 10/17/2022  Activities/Precautions/ Functional Status: Activity: no lifting, driving, or strenuous exercise until cleared by MD; continue sternal precautions Diet: cardiac diet Wound Care: keep wound clean and dry Functional status:  ___ No restrictions     ___ Walk up steps independently _x__ 24/7 supervision/assistance   ___ Walk up steps with assistance ___ Intermittent supervision/assistance  ___ Bathe/dress independently ___ Walk with walker     ___ Bathe/dress with assistance ___ Walk Independently    ___ Shower independently __x_ Walk with assistance    __x_ Shower with assistance ___ No alcohol     ___ Return to work/school ________   Special Instructions: No driving, alcohol consumption or tobacco use.   COMMUNITY REFERRALS UPON DISCHARGE:    Home Health:   PT     OT     ST    RN                   Agency: [facility] Phone: [phone]    Medical Equipment/Items Ordered: Bedside Commode                                                 Agency/Supplier: [employer] [phone]  My questions have been answered and I understand these instructions. I will adhere to these goals and the provided educational materials after my discharge from the hospital.  Patient/Caregiver Signature _______________________________ Date __________  Clinician Signature _______________________________________ Date __________  Please bring this form and your medication list with you to all your follow-up doctor's appointments.

## 2022-09-28 NOTE — Evaluation (Signed)
Occupational Therapy Assessment and Plan  Patient Details  Name: Charles Holmes MRN: [id number] Date of Birth: January 30, 1965  OT Diagnosis: acute pain, cognitive deficits, hemiplegia affecting non-dominant side, and muscle weakness (generalized) Rehab Potential: Rehab Potential (ACUTE ONLY): Good ELOS: 2 weeks   Today's Date: 09/28/2022 OT Individual Time: 2841-3244 OT Individual Time Calculation (min): 75 min     Hospital Problem: Principal Problem:   Acute right MCA stroke (HCC)   Past Medical History:  Past Medical History:  Diagnosis Date   CAD (coronary artery disease)    CHF (congestive heart failure) (HCC)    COPD (chronic obstructive pulmonary disease) (HCC)    GERD (gastroesophageal reflux disease)    Hyperlipidemia    Hypertension    LVAD (left ventricular assist device) present (HCC)    Stroke (HCC)    Systolic heart failure (HCC) 2021   LVEF 18%, RVEF 38% on cardiac MRI 12/19/2019. possible cardiac sarcoidosis.   Wide-complex tachycardia 2021   wears LifeVest   Past Surgical History:  Past Surgical History:  Procedure Laterality Date   BIV UPGRADE N/A 06/07/2021   Procedure: BIV ICD UPGRADE;  Surgeon: Regan Lemming, MD;  Location: [facility] INVASIVE CV LAB;  Service: Cardiovascular;  Laterality: N/A;   CLIPPING OF ATRIAL APPENDAGE Left 08/29/2022   Procedure: CLIPPING OF ATRIAL APPENDAGE;  Surgeon: Lovett Sox, MD;  Location: [facility] OR;  Service: Open Heart Surgery;  Laterality: Left;   IABP INSERTION N/A 08/25/2022   Procedure: IABP Insertion;  Surgeon: Dolores Patty, MD;  Location: [facility] INVASIVE CV LAB;  Service: Cardiovascular;  Laterality: N/A;   ICD IMPLANT N/A 02/20/2020   Procedure: ICD IMPLANT;  Surgeon: Regan Lemming, MD;  Location: [facility] INVASIVE CV LAB;  Service: Cardiovascular;  Laterality: N/A;   INSERTION OF IMPLANTABLE LEFT VENTRICULAR ASSIST DEVICE N/A 08/29/2022   Procedure: INSERTION OF IMPLANTABLE LEFT VENTRICULAR ASSIST DEVICE;  Surgeon:  Lovett Sox, MD;  Location: [facility] OR;  Service: Open Heart Surgery;  Laterality: N/A;   IR CT HEAD LTD  07/10/2022   IR CT HEAD LTD  08/30/2022   IR PERCUTANEOUS ART THROMBECTOMY/INFUSION INTRACRANIAL INC DIAG ANGIO  07/10/2022   IR PERCUTANEOUS ART THROMBECTOMY/INFUSION INTRACRANIAL INC DIAG ANGIO  08/30/2022   IR US GUIDE VASC ACCESS LEFT  08/30/2022   IR US GUIDE VASC ACCESS RIGHT  07/10/2022   RADIOLOGY WITH ANESTHESIA N/A 07/10/2022   Procedure: RADIOLOGY WITH ANESTHESIA;  Surgeon: Radiologist, Medication, MD;  Location: [facility] OR;  Service: Radiology;  Laterality: N/A;   RADIOLOGY WITH ANESTHESIA N/A 08/30/2022   Procedure: IR WITH ANESTHESIA;  Surgeon: Julieanne Cotton, MD;  Location: [facility] OR;  Service: Radiology;  Laterality: N/A;   REMOVAL OF IMPELLA LEFT VENTRICULAR ASSIST DEVICE N/A 08/29/2022   Procedure: REMOVAL OF INTRA-AORTIC BALLON PUMP;  Surgeon: Lovett Sox, MD;  Location: [facility] OR;  Service: Open Heart Surgery;  Laterality: N/A;   RIGHT HEART CATH N/A 07/14/2022   Procedure: RIGHT HEART CATH;  Surgeon: Dolores Patty, MD;  Location: [facility] INVASIVE CV LAB;  Service: Cardiovascular;  Laterality: N/A;   RIGHT HEART CATH N/A 08/25/2022   Procedure: RIGHT HEART CATH;  Surgeon: Dolores Patty, MD;  Location: [facility] INVASIVE CV LAB;  Service: Cardiovascular;  Laterality: N/A;   RIGHT HEART CATH N/A 09/12/2022   Procedure: RIGHT HEART CATH;  Surgeon: Laurey Morale, MD;  Location: [facility] INVASIVE CV LAB;  Service: Cardiovascular;  Laterality: N/A;   RIGHT/LEFT HEART CATH AND CORONARY ANGIOGRAPHY N/A 12/16/2019   Procedure:  RIGHT/LEFT HEART CATH AND CORONARY ANGIOGRAPHY;  Surgeon: [place], Peter M, MD;  Location: [facility] INVASIVE CV LAB;  Service: Cardiovascular;  Laterality: N/A;   RIGHT/LEFT HEART CATH AND CORONARY ANGIOGRAPHY N/A 08/10/2022   Procedure: RIGHT/LEFT HEART CATH AND CORONARY ANGIOGRAPHY;  Surgeon: Dolores Patty, MD;  Location: [facility] INVASIVE CV LAB;  Service: Cardiovascular;   Laterality: N/A;   TEE WITHOUT CARDIOVERSION N/A 08/29/2022   Procedure: TRANSESOPHAGEAL ECHOCARDIOGRAM;  Surgeon: Lovett Sox, MD;  Location: [facility] OR;  Service: Open Heart Surgery;  Laterality: N/A;   TOOTH EXTRACTION N/A 08/22/2022   Procedure: DENTAL RESTORATION/EXTRACTIONS;  Surgeon: Ocie Doyne, DMD;  Location: [facility] OR;  Service: Oral Surgery;  Laterality: N/A;    Assessment & Plan Clinical Impression:  [place] Banner. Fabbri is a 57 year old treated at [facility] in 2021 for CAD and heart failure now with LVAD. On the day of admission, he was evaluated at the heart failure outpatient clinic, he was found with worsening heart failure and was referred to the ED for further evaluation. Admitted on 08/10/2022 on HF team service. 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 At baseline, mild asterixis bilaterally and slight diminished fine finger movements on the left.    Underwent R/L cardiac cath showed low cardiac output and filling pressures. PFT/pulmonary medicine evaluation due to severe COPD. Started advanced therapies due to very labile volume status with any decreases in milrinone or diuretics consistent with stage D heart failure. Dental surgery consulted 8/05 and underwent remaining tooth extractions (#13) on 8/06. Back to cath lab and IABP placed on 8/09 for ongoing cardiogenic shock. Head CT performed on 8/17 showed interval evolution of the right MCA territory infarct with decreased intraparenchymal and intra ventricular hemorrhage. No hydrocephalus or midline shift. LVAD (HeartMate 3) placed 8/13. New left sided weakness noted on 8/14 and code stroke activated. Seen by Dr. Derry Lory. CTA with R MCA M1 occlusion. CTP with a core infarct of 12cc and penumbra of 90cc.  Code IR activated and patient taken for mechanical thrombectomy. Extubated on 8/15. Oriented and language fluent with mild dysarthria on 8/18. Left facial droop and unable to lift LLE against gravity. Remained on heparin IV. Course complicated by Enterococcus bacteremia on 9/06 and ID consulted. Will remain on IV antibiotics through 11/04/2022. PO intake not yet optimal. On regular diet. Now on aspirin and coumadin. Delirium persists with hallucinations. The patient requires inpatient medicine and rehabilitation evaluations and services for ongoing dysfunction secondary to heart failure and right MCA stroke. Patient transferred to CIR on 09/27/2022 .    Patient currently requires max with basic self-care skills secondary to muscle weakness, decreased cardiorespiratoy endurance, unbalanced muscle activation, decreased coordination, and decreased motor planning, decreased attention to left, decreased awareness, decreased problem solving, and decreased safety awareness, central origin, and decreased sitting balance, decreased standing balance, decreased postural control, hemiplegia, decreased balance strategies, and difficulty maintaining precautions.  Prior to hospitalization, patient could complete BADLs and IADLs with independent .  Patient will benefit from skilled intervention to decrease level of assist with basic self-care skills and increase independence with basic self-care skills prior to discharge home with care partner.  Anticipate patient will require 24 hour supervision and follow up home health.  OT - End of Session Activity Tolerance: Decreased this session Endurance Deficit: Yes  Endurance Deficit Description: decreased OT Assessment Rehab Potential (ACUTE ONLY): Good OT Patient demonstrates impairments in the following area(s): Balance;Behavior;Safety;Perception;Cognition;Skin Integrity;Endurance;Motor;Pain;Edema OT Basic ADL's Functional Problem(s):  Toileting;Dressing;Bathing;Grooming;Eating OT Transfers Functional Problem(s): Toilet OT Additional Impairment(s): Fuctional Use of Upper Extremity OT Plan OT Intensity: Minimum of 1-2 x/day, 45 to 90 minutes OT Frequency: 5 out of 7 days OT Duration/Estimated Length of Stay: 2 weeks OT Treatment/Interventions: Balance/vestibular training;Discharge planning;Pain management;Self Care/advanced ADL retraining;Therapeutic Activities;UE/LE Coordination activities;Cognitive remediation/compensation;Disease mangement/prevention;Skin care/wound managment;Patient/family education;Functional mobility training;Community reintegration;DME/adaptive equipment instruction;Neuromuscular re-education;Psychosocial support;Splinting/orthotics;Wheelchair propulsion/positioning;Visual/perceptual remediation/compensation;Therapeutic Exercise;UE/LE Strength taining/ROM OT Self Feeding Anticipated Outcome(s): Set-up OT Basic Self-Care Anticipated Outcome(s): Supervision-CGA OT Toileting Anticipated Outcome(s): Min A OT Bathroom Transfers Anticipated Outcome(s): Supervision-CGA OT Recommendation Patient destination: Home (home with cousin) Follow Up Recommendations: Home health OT Equipment Recommended: To be determined   OT Evaluation Precautions/Restrictions  Precautions Precautions: Sternal;Fall Precaution Comments: LVAD Restrictions Weight Bearing Restrictions: Yes RUE Weight Bearing: Non weight bearing RLE Weight Bearing: Non weight bearing Other Position/Activity Restrictions: Sternal Precautions General Chart Reviewed: Yes Additional Pertinent History: Hx of CVA and severed COPD Family/Caregiver Present: No Vital Signs Therapy Vitals Temp: 98.5 F (36.9 C) Pulse Rate: 85 Resp: 17 BP: (!) 88/75 Patient Position (if appropriate): Lying Oxygen Therapy SpO2: 95 % O2 Device: Room Air Pain Pain Assessment Pain Scale: 0-10 Pain Score: 0-No pain Home Living/Prior Functioning Home  Living Family/patient expects to be discharged to:: Private residence Living Arrangements: Alone Available Help at Discharge: Family, Available 24 hours/day (pt plans to d/c home with his cousin and her spouse; also has a supportive DTR, but she has a new born baby) Type of Home: Mobile home Home Access: Stairs to enter Secretary/administrator of Steps: 1 Entrance Stairs-Rails: Right, Left Home Layout: One level Bathroom Shower/Tub: Health visitor: Standard Bathroom Accessibility: Yes  Lives With: Alone IADL History Homemaking Responsibilities: Yes Meal Prep Responsibility: Secondary Laundry Responsibility: Secondary Cleaning Responsibility: Secondary Bill Paying/Finance Responsibility: Secondary Shopping Responsibility: Secondary Homemaking Comments: Pt has famly who can assist at d/c Vision Baseline Vision/History: 1 Wears glasses Ability to See in Adequate Light: 0 Adequate Patient Visual Report: No change from baseline Vision Assessment?: No apparent visual deficits Perception  Perception: Impaired Perception-Other Comments: Decreased L side attention Praxis Praxis: Impaired Praxis Impairment Details: Motor planning Cognition Cognition Overall Cognitive Status: Impaired/Different from baseline Arousal/Alertness: Awake/alert Orientation Level: Person;Place;Situation Person: Oriented Place: Oriented Situation: Oriented Memory: Appears intact Attention: Sustained Sustained Attention: Appears intact Awareness: Appears intact Problem Solving: Impaired Problem Solving Impairment: Functional basic Executive Function: Self Monitoring;Reasoning Reasoning: Impaired Reasoning Impairment: Functional basic Self Monitoring: Impaired Self Monitoring Impairment: Functional basic Behaviors:  (self-limiting) Safety/Judgment: Appears intact Brief Interview for Mental Status (BIMS) Repetition of Three Words (First Attempt): 3 Temporal Orientation: Year:  Correct Temporal Orientation: Month: Accurate within 5 days Temporal Orientation: Day: Correct Recall: "Sock": Yes, no cue required Recall: "Blue": Yes, no cue required Recall: "Bed": Yes, no cue required BIMS Summary Score: 15 Sensation Sensation Light Touch: Appears Intact Hot/Cold: Not tested Proprioception: Impaired by gross assessment (decreased awareness of L UE placement) Coordination Gross Motor Movements are Fluid and Coordinated: No Fine Motor Movements are Fluid and Coordinated: No Coordination and Movement Description: grossly uncoordinated 2/2 L hemi Finger Nose Finger Test: limited L UE 2/2 weakness and ROM deficits Heel Shin Test: WFL R LE, L LE limited due to ROM and strength deficits Motor  Motor Motor: Hemiplegia Motor - Skilled Clinical Observations: L hemi and LVAD  Trunk/Postural Assessment  Cervical Assessment Cervical  Assessment: Exceptions to [facility] (forward head) Thoracic Assessment Thoracic Assessment: Exceptions to [facility] (rounded shoudlers) Lumbar Assessment Lumbar Assessment: Exceptions to [facility] (posterior pelvic tilt) Postural Control Postural Control: Deficits on evaluation Protective Responses: decreased  Balance Balance Balance Assessed: Yes Static Sitting Balance Static Sitting - Balance Support: Feet supported Static Sitting - Level of Assistance: 5: Stand by assistance Dynamic Sitting Balance Dynamic Sitting - Balance Support: Feet unsupported Dynamic Sitting - Level of Assistance: 4: Min assist;5: Stand by assistance Dynamic Sitting - Balance Activities: Lateral lean/weight shifting Static Standing Balance Static Standing - Balance Support: During functional activity Static Standing - Level of Assistance: 3: Mod assist Extremity/Trunk Assessment RUE Assessment RUE Assessment: Within Functional Limits General Strength Comments: decreased strength 2/2 deconditioning LUE Assessment LUE Assessment: Exceptions to [facility] Active Range of Motion  (AROM) Comments: decreased throughout proximally>distally General Strength Comments: grip 3/3, wrist flexion/extension 2/5, elbow flexion/extension 2-/5, shulder flexion 2-/5  Care Tool Care Tool Self Care Eating   Eating Assist Level: Minimal Assistance - Patient > 75%    Oral Care    Oral Care Assist Level: Minimal Assistance - Patient > 75%    Bathing   Body parts bathed by patient: Chest;Left arm;Abdomen;Front perineal area;Face Body parts bathed by helper: Buttocks;Right upper leg;Left upper leg;Right lower leg;Left lower leg;Right arm   Assist Level: Maximal Assistance - Patient 24 - 49%    Upper Body Dressing(including orthotics)   What is the patient wearing?: Pull over shirt   Assist Level: Maximal Assistance - Patient 25 - 49%    Lower Body Dressing (excluding footwear)   What is the patient wearing?: Pants;Underwear/pull up Assist for lower body dressing: Maximal Assistance - Patient 25 - 49%    Putting on/Taking off footwear   What is the patient wearing?: Socks Assist for footwear: Maximal Assistance - Patient 25 - 49%       Care Tool Toileting Toileting activity   Assist for toileting: Maximal Assistance - Patient 25 - 49%     Care Tool Bed Mobility Roll left and right activity   Roll left and right assist level: Moderate Assistance - Patient 50 - 74%    Sit to lying activity   Sit to lying assist level: Moderate Assistance - Patient 50 - 74%    Lying to sitting on side of bed activity   Lying to sitting on side of bed assist level: the ability to move from lying on the back to sitting on the side of the bed with no back support.: Moderate Assistance - Patient 50 - 74%     Care Tool Transfers Sit to stand transfer   Sit to stand assist level: Moderate Assistance - Patient 50 - 74%    Chair/bed transfer   Chair/bed transfer assist level: Moderate Assistance - Patient 50 - 74%     Toilet transfer   Assist Level: Moderate Assistance - Patient 50 -  74%     Care Tool Cognition  Expression of Ideas and Wants Expression of Ideas and Wants: 3. Some difficulty - exhibits some difficulty with expressing needs and ideas (e.g, some words or finishing thoughts) or speech is not clear  Understanding Verbal and Non-Verbal Content Understanding Verbal and Non-Verbal Content: 3. Usually understands - understands most conversations, but misses some part/intent of message. Requires cues at times to understand   Memory/Recall Ability Memory/Recall Ability : Staff names and faces;That he or she is in a hospital/hospital unit;Current season   Refer to Care Plan for Long  Term Goals  SHORT TERM GOAL WEEK 1 OT Short Term Goal 1 (Week 1): Pt will complete toilet transfers min A using LRAD OT Short Term Goal 2 (Week 1): Pt will recall hemi-dressing techniques with min verbal cues OT Short Term Goal 3 (Week 1): Pt will maintain sternal precautions during BADLs and functional transfers at 95% safe with min verbal cues OT Short Term Goal 4 (Week 1): Pt will maintain dynamic standing balance using LRAD during functional task >1 minute to demonstrate increased activity tolerance  Recommendations for other services: None    Skilled Therapeutic Intervention Skilled Therapeutic Interventions/Progress Updates:  1:1 Skilled OT evaluation and intervention initiated with education provided on OT role, goals, and POC. Pt received semi reclined in bed presenting to be in good spirits receptive to skilled OT session reporting 0/10 pain- OT offering intermittent rest breaks, repositioning, and therapeutic support to optimize participation in therapy session. Pt presenting to be extremely fatigued throughout session with decreased activity tolerance note. Pt reporting need to have BM during session, however declining to get OOB to use BSC and insisting on using bed pain- maximal encouragement and education provided on importance of getting OOB to use bathroom, however Pt  continued to decline. Pt with continent BM in bed with nursing staff informed. Pt required max A for posterior peri-care and to donn clean brief. Pt completed BADLs and functional transfers at levels listed below this session with increased time and motivation required to support participation. Pt would benefit from continued OT services in IPR setting. Pt was left resting in bed with call bell in reach, bed alarm on, LVAD in place, and all needs met.   ADL ADL Eating: Minimal assistance Where Assessed-Eating: Bed level Grooming: Minimal assistance;Moderate assistance Where Assessed-Grooming: Edge of bed Upper Body Bathing: Moderate assistance Where Assessed-Upper Body Bathing: Edge of bed Lower Body Bathing: Maximal assistance Where Assessed-Lower Body Bathing: Edge of bed Upper Body Dressing: Maximal assistance Where Assessed-Upper Body Dressing: Edge of bed Lower Body Dressing: Maximal assistance Where Assessed-Lower Body Dressing: Edge of bed Toileting: Maximal assistance Where Assessed-Toileting: Bed level Toilet Transfer: Moderate assistance Toilet Transfer Method: Stand pivot Toilet Transfer Equipment: Gaffer: Not assessed Film/video editor: Not assessed Mobility  Bed Mobility Bed Mobility: Rolling Right;Rolling Left;Supine to Sit;Sit to Supine Rolling Right: Moderate Assistance - Patient 50-74% Rolling Left: Moderate Assistance - Patient 50-74% Supine to Sit: Moderate Assistance - Patient 50-74% Sit to Supine: Minimal Assistance - Patient > 75%;Moderate Assistance - Patient 50-74% Transfers Sit to Stand: Moderate Assistance - Patient 50-74% Stand to Sit: Moderate Assistance - Patient 50-74%   Discharge Criteria: Patient will be discharged from OT if patient refuses treatment 3 consecutive times without medical reason, if treatment goals not met, if there is a change in medical status, if patient makes no progress towards goals or if patient  is discharged from hospital.  The above assessment, treatment plan, treatment alternatives and goals were discussed and mutually agreed upon: by patient  Clide Deutscher 09/28/2022, 1:11 PM

## 2022-09-28 NOTE — Progress Notes (Signed)
ANTICOAGULATION CONSULT NOTE  Pharmacy Consult for warfarin Indication:  LVAD  Allergies  Allergen Reactions   Pacerone [Amiodarone] Other (See Comments)    Severe tremors   Percocet [Oxycodone-Acetaminophen] Itching    Patient Measurements: Height: 5\' 7"  (170.2 cm) Weight: 79.3 kg (174 lb 13.2 oz) IBW/kg (Calculated) : 66.1 Heparin Dosing Weight: 87kg  Vital Signs: Temp: 98.6 F (37 C) (09/12 0515) Temp Source: Oral (09/12 0515) BP: 121/80 (09/12 0908) Pulse Rate: 92 (09/12 0851)  Labs: Recent Labs    09/26/22 0223 09/27/22 0226 09/28/22 0808  HGB 9.2* 9.5*  --   HCT 30.5* 32.6*  --   PLT 283 307  --   LABPROT 28.1* 28.4* 19.8*  INR 2.6* 2.6* 1.7*  CREATININE 0.67 0.70 0.62    Estimated Creatinine Clearance: 93 mL/min (by C-G formula based on SCr of 0.62 mg/dL).   Medical History: Past Medical History:  Diagnosis Date   CAD (coronary artery disease)    CHF (congestive heart failure) (HCC)    COPD (chronic obstructive pulmonary disease) (HCC)    GERD (gastroesophageal reflux disease)    Hyperlipidemia    Hypertension    LVAD (left ventricular assist device) present (HCC)    Stroke (HCC)    Systolic heart failure (HCC) 2021   LVEF 18%, RVEF 38% on cardiac MRI 12/19/2019. possible cardiac sarcoidosis.   Wide-complex tachycardia 2021   wears LifeVest     Assessment: Charles Holmes on apixaban PTA for hx AF admitted for LVAD workup. Pt s/p HM3 implant on 8/13 c/b acute CVA postop. Started low dose heparin + warfarin started 8/19. Low-dose heparin was stopped 8/31.  INR dropped to subtherapeutic range at 1.7 today. CBC remains stable. No s/sx of bleeding. Oral intake is improving from 50% to 100% of scheduled meals consumed yesterday.  Goal of Therapy:  INR 2-2.5 Monitor platelets by anticoagulation protocol: Yes   Plan:  Warfarin 2.5 mg tonight, with diet recently improving would recommend cautiously increasing dose for today. If INR remains at current level  or decreases, would recommend increasing warfarin dose tomorrow. Monitor daily INR, CBC  Thank you for allowing pharmacy to participate in this patient's care,  Wilmer Floor, PharmD PGY2 Cardiology Pharmacy Resident 09/28/2022 10:49 AM  Please check AMION for all [facility] Pharmacy phone numbers After 10:00 PM, call Main Pharmacy [phone]

## 2022-09-28 NOTE — Evaluation (Signed)
Speech Language Pathology Assessment and Plan  Patient Details  Name: Charles Holmes MRN: [id number] Date of Birth: August 30, 1965  SLP Diagnosis: Cognitive Impairments;Dysarthria  Rehab Potential: Good ELOS: 2-3 weeks    Today's Date: 09/28/2022 SLP Individual Time: 1100-1155 SLP Individual Time Calculation (min): 55 min   Hospital Problem: Principal Problem:   Acute right MCA stroke (HCC)  Past Medical History:  Past Medical History:  Diagnosis Date   CAD (coronary artery disease)    CHF (congestive heart failure) (HCC)    COPD (chronic obstructive pulmonary disease) (HCC)    GERD (gastroesophageal reflux disease)    Hyperlipidemia    Hypertension    LVAD (left ventricular assist device) present (HCC)    Stroke (HCC)    Systolic heart failure (HCC) 2021   LVEF 18%, RVEF 38% on cardiac MRI 12/19/2019. possible cardiac sarcoidosis.   Wide-complex tachycardia 2021   wears LifeVest   Past Surgical History:  Past Surgical History:  Procedure Laterality Date   BIV UPGRADE N/A 06/07/2021   Procedure: BIV ICD UPGRADE;  Surgeon: Regan Lemming, MD;  Location: [facility] INVASIVE CV LAB;  Service: Cardiovascular;  Laterality: N/A;   CLIPPING OF ATRIAL APPENDAGE Left 08/29/2022   Procedure: CLIPPING OF ATRIAL APPENDAGE;  Surgeon: Lovett Sox, MD;  Location: [facility] OR;  Service: Open Heart Surgery;  Laterality: Left;   IABP INSERTION N/A 08/25/2022   Procedure: IABP Insertion;  Surgeon: Dolores Patty, MD;  Location: [facility] INVASIVE CV LAB;  Service: Cardiovascular;  Laterality: N/A;   ICD IMPLANT N/A 02/20/2020   Procedure: ICD IMPLANT;  Surgeon: Regan Lemming, MD;  Location: [facility] INVASIVE CV LAB;  Service: Cardiovascular;  Laterality: N/A;   INSERTION OF IMPLANTABLE LEFT VENTRICULAR ASSIST DEVICE N/A 08/29/2022   Procedure: INSERTION OF IMPLANTABLE LEFT VENTRICULAR ASSIST DEVICE;  Surgeon: Lovett Sox, MD;  Location: [facility] OR;  Service: Open Heart Surgery;  Laterality: N/A;   IR CT  HEAD LTD  07/10/2022   IR CT HEAD LTD  08/30/2022   IR PERCUTANEOUS ART THROMBECTOMY/INFUSION INTRACRANIAL INC DIAG ANGIO  07/10/2022   IR PERCUTANEOUS ART THROMBECTOMY/INFUSION INTRACRANIAL INC DIAG ANGIO  08/30/2022   IR US GUIDE VASC ACCESS LEFT  08/30/2022   IR US GUIDE VASC ACCESS RIGHT  07/10/2022   RADIOLOGY WITH ANESTHESIA N/A 07/10/2022   Procedure: RADIOLOGY WITH ANESTHESIA;  Surgeon: Radiologist, Medication, MD;  Location: [facility] OR;  Service: Radiology;  Laterality: N/A;   RADIOLOGY WITH ANESTHESIA N/A 08/30/2022   Procedure: IR WITH ANESTHESIA;  Surgeon: Julieanne Cotton, MD;  Location: [facility] OR;  Service: Radiology;  Laterality: N/A;   REMOVAL OF IMPELLA LEFT VENTRICULAR ASSIST DEVICE N/A 08/29/2022   Procedure: REMOVAL OF INTRA-AORTIC BALLON PUMP;  Surgeon: Lovett Sox, MD;  Location: [facility] OR;  Service: Open Heart Surgery;  Laterality: N/A;   RIGHT HEART CATH N/A 07/14/2022   Procedure: RIGHT HEART CATH;  Surgeon: Dolores Patty, MD;  Location: [facility] INVASIVE CV LAB;  Service: Cardiovascular;  Laterality: N/A;   RIGHT HEART CATH N/A 08/25/2022   Procedure: RIGHT HEART CATH;  Surgeon: Dolores Patty, MD;  Location: [facility] INVASIVE CV LAB;  Service: Cardiovascular;  Laterality: N/A;   RIGHT HEART CATH N/A 09/12/2022   Procedure: RIGHT HEART CATH;  Surgeon: Laurey Morale, MD;  Location: [facility] INVASIVE CV LAB;  Service: Cardiovascular;  Laterality: N/A;   RIGHT/LEFT HEART CATH AND CORONARY ANGIOGRAPHY N/A 12/16/2019   Procedure: RIGHT/LEFT HEART CATH AND CORONARY ANGIOGRAPHY;  Surgeon: [place], Peter M, MD;  Location:  [facility] INVASIVE CV LAB;  Service: Cardiovascular;  Laterality: N/A;   RIGHT/LEFT HEART CATH AND CORONARY ANGIOGRAPHY N/A 08/10/2022   Procedure: RIGHT/LEFT HEART CATH AND CORONARY ANGIOGRAPHY;  Surgeon: Dolores Patty, MD;  Location: [facility] INVASIVE CV LAB;  Service: Cardiovascular;  Laterality: N/A;   TEE WITHOUT CARDIOVERSION N/A 08/29/2022   Procedure: TRANSESOPHAGEAL ECHOCARDIOGRAM;   Surgeon: Lovett Sox, MD;  Location: [facility] OR;  Service: Open Heart Surgery;  Laterality: N/A;   TOOTH EXTRACTION N/A 08/22/2022   Procedure: DENTAL RESTORATION/EXTRACTIONS;  Surgeon: Ocie Doyne, DMD;  Location: [facility] OR;  Service: Oral Surgery;  Laterality: N/A;    Assessment / Plan / Recommendation Clinical Impression Patient is a 57 y.o. male with a history of chronic systolic CHF who presented on 08/26/89 for with SOB and was found to be in cardiogenic shock. Pt was found to have multiple dental caries as well and 13 teeth extracted on 8/6. In the ICU an IABP was placed and ultimately the pt underwent LAVD placement on 8/13. On 8/14 pt was noted to have left sided weakness. CT demonstrated acute right MCA infarct. Pt had thrombectomy, some hemorrhagic conversion in area of stroke.  Therapy evaluations completed with recommendations for CIR. Patient admitted 09/27/22.  Patient demonstrates mild cognitive impairments impacting processing speed, attention to the left field of environment and problem solving/recall in regards to procedures, alarms, etc for new LVAD. SLP administered the [facility] Mental Status Examination (SLUMS). Patient scored  27/30 points with a score of 27 or above considered normal.  Patient's auditory comprehension and verbal expression appeared [facility] for tasks assessed. However, patient demonstrates decreased speech intelligibility due to a low vocal intensity and imprecise consonants due to mild left oral motor weakness and being edentulous.  Patient observed with trials of regular textures and thin liquids. Patient demonstrated mildly prolonged but efficient mastication of regular textures with minimal oral residue despite oral-motor weakness and being edentulous due to recent extractions. Patient with one delayed cough after several large, sequential sips which was eliminated with smaller, more controlled sip size. Recommend patient continue current diet with regular  textures and thin liquids to allow for more meal options as patient knows to choose softer textures or orally expectorate any food they may be difficult to masticate.   Patient would benefit from skilled SLP intervention to maximize his speech intelligibility and cognitive functioning prior to discharge.    Skilled Therapeutic Interventions          Administered a cognitive-linguistic evaluation and BSE, please see above for details.   SLP Assessment  Patient will need skilled Speech Lanaguage Pathology Services during CIR admission    Recommendations  SLP Diet Recommendations: Age appropriate regular solids;Thin Liquid Administration via: Cup;Straw Medication Administration: Whole meds with puree Supervision: Patient able to self feed;Staff to assist with self feeding Compensations: Slow rate;Small sips/bites Postural Changes and/or Swallow Maneuvers: Seated upright 90 degrees Oral Care Recommendations: Oral care BID Recommendations for Other Services: Neuropsych consult Patient destination: Home Follow up Recommendations: Home Health SLP;24 hour supervision/assistance Equipment Recommended: None recommended by SLP    SLP Frequency 1 to 3 out of 7 days   SLP Duration  SLP Intensity  SLP Treatment/Interventions 2-3 weeks  Minumum of 1-2 x/day, 30 to 90 minutes  Cognitive remediation/compensation;Cueing hierarchy;Environmental controls;Functional tasks;Internal/external aids;Patient/family education;Speech/Language facilitation    Pain Pain Assessment Pain Scale: 0-10 Pain Score: 0-No pain   SLP Evaluation Cognition Overall Cognitive Status: Impaired/Different from baseline Arousal/Alertness: Awake/alert Orientation Level: Oriented X4 Memory:  Appears intact Awareness: Appears intact Problem Solving: Impaired Problem Solving Impairment: Functional basic Safety/Judgment: Appears intact  Comprehension Auditory Comprehension Overall Auditory Comprehension: Appears within  functional limits for tasks assessed Expression Expression Primary Mode of Expression: Verbal Verbal Expression Overall Verbal Expression: Appears within functional limits for tasks assessed Oral Motor Oral Motor/Sensory Function Overall Oral Motor/Sensory Function: Mild impairment Facial ROM: Reduced left Facial Symmetry: Abnormal symmetry left Facial Strength: Reduced left Facial Sensation: Within Functional Limits Lingual ROM: Within Functional Limits Lingual Symmetry: Within Functional Limits Lingual Strength: Within Functional Limits Lingual Sensation: Within Functional Limits Velum: Within Functional Limits Mandible: Within Functional Limits Motor Speech Overall Motor Speech: Impaired Respiration: Within functional limits Phonation: Low vocal intensity Resonance: Within functional limits Articulation: Impaired Level of Impairment: Phrase Intelligibility: Intelligibility reduced Word: 75-100% accurate Phrase: 75-100% accurate Sentence: 75-100% accurate Motor Planning: Witnin functional limits Effective Techniques: Over-articulate;Increased vocal intensity  Care Tool Care Tool Cognition Ability to hear (with hearing aid or hearing appliances if normally used Ability to hear (with hearing aid or hearing appliances if normally used): 0. Adequate - no difficulty in normal conservation, social interaction, listening to TV   Expression of Ideas and Wants Expression of Ideas and Wants: 3. Some difficulty - exhibits some difficulty with expressing needs and ideas (e.g, some words or finishing thoughts) or speech is not clear   Understanding Verbal and Non-Verbal Content Understanding Verbal and Non-Verbal Content: 3. Usually understands - understands most conversations, but misses some part/intent of message. Requires cues at times to understand  Memory/Recall Ability Memory/Recall Ability : Staff names and faces;That he or she is in a hospital/hospital unit;Current season    Bedside Swallowing Assessment General Date of Onset: 09/26/22 Previous Swallow Assessment: BSE 9/11: Upgraded to regular textures with thin liquids Diet Prior to this Study: Regular;Thin liquids (Level 0) Temperature Spikes Noted: No Respiratory Status: Room air History of Recent Intubation: Yes Total duration of intubation (days): 2 days Date extubated: 08/31/22 Behavior/Cognition: Alert;Cooperative;Pleasant mood Oral Cavity - Dentition: Edentulous Self-Feeding Abilities: Able to feed self Vision: Functional for self-feeding Patient Positioning: Upright in bed Baseline Vocal Quality: Low vocal intensity Volitional Cough: Strong Volitional Swallow: Able to elicit  Oral Care Assessment Oral Assessment  (WDL): Exceptions to WDL Lips: Symmetrical Teeth: Missing (Comment) Tongue: Pink;Moist Mucous Membrane(s): Moist;Pink Saliva: Moist, saliva free flowing Level of Consciousness: Alert Is patient on any of following O2 devices?: None of the above Nutritional status: No high risk factors Oral Assessment Risk : Low Risk Ice Chips Ice chips: Not tested Thin Liquid Thin Liquid: Impaired Presentation: Straw;Self Fed Pharyngeal  Phase Impairments: Cough - Delayed Other Comments: X 1 throughout multiple trials, suspect due to multiple seqential sips Nectar Thick Nectar Thick Liquid: Not tested Honey Thick Honey Thick Liquid: Not tested Puree Puree: Impaired Presentation: Self Fed;Spoon Oral Phase Functional Implications: Prolonged oral transit Solid Solid: Impaired Presentation: Self Fed;Spoon Oral Phase Functional Implications: Impaired mastication BSE Assessment Risk for Aspiration Impact on safety and function: Mild aspiration risk Other Related Risk Factors: Previous CVA;History of dysphagia;Deconditioning  Short Term Goals: Week 1: SLP Short Term Goal 1 (Week 1): Patient will attend to left field of enviornment during functional tasks with supervision level verbal  cues. SLP Short Term Goal 2 (Week 1): Patient will recall sequence to switching to LVAD batteries with Min verbal and visual cues. SLP Short Term Goal 3 (Week 1): Patient will recall reasoning for specific LVAD alarms with Min verbal and visual cues. SLP Short Term Goal 4 (Week  1): Patient will utilize speech intelligibility strategies at the phrase level to achieve ~80% intelligibility with Min verbal cues.  Refer to Care Plan for Long Term Goals  Recommendations for other services: Therapeutic Recreation  Pet therapy  Discharge Criteria: Patient will be discharged from SLP if patient refuses treatment 3 consecutive times without medical reason, if treatment goals not met, if there is a change in medical status, if patient makes no progress towards goals or if patient is discharged from hospital.  The above assessment, treatment plan, treatment alternatives and goals were discussed and mutually agreed upon: by patient  Rolinda Impson 09/28/2022, 2:43 PM

## 2022-09-28 NOTE — Discharge Summary (Signed)
Physician Discharge Summary  Patient ID: MALCOM SANANTONIO MRN: [id number] DOB/AGE: 1965/05/11 57 y.o.  Admit date: 09/27/2022 Discharge date: 10/17/2022  Discharge Diagnoses:  Principal Problem:   Acute right MCA stroke [facility]) Active problems: Functional deficits secondary to acute right MCA stroke Delirium Insomnia Urinary frequency Anorexia Hypoalbuminemia Hypotension Hyperlipidemia Enterococcus bacteremia Nonobstructive coronary artery disease Acute on chronic systolic failure Cardiogenic shock status post LVAD History of ventricular tachycardia Diabetes mellitus type 2 Obstructive sleep apnea Iron deficiency anemia Severe COPD Status post multiple tooth extractions History of glaucoma Candida intertrigo Dysuria Left upper extremity superficial vein thrombosis, chronic  Discharged Condition: stable  Significant Diagnostic Studies: UPPER VENOUS STUDY     Patient Name:  KANARD FORS  Date of Exam:   10/06/2022  Medical Rec #: [id number]        Accession #:    [id number]  Date of Birth: 1965-10-12        Patient Gender: M  Patient Age:   46 years  Exam Location:  [facility]  Procedure:      VAS [place] UPPER EXTREMITY VENOUS DUPLEX  Referring Phys: [name] VANTRIGT    ---------------------------------------------------------------------------  -----    Indications: Edema  Risk Factors: Immobility.  Anticoagulation: Coumadin.  Comparison Study: No prior study   Performing Technologist: Shona Simpson     Examination Guidelines: A complete evaluation includes B-mode imaging,  spectral  Doppler, color Doppler, and power Doppler as needed of all accessible  portions  of each vessel. Bilateral testing is considered an integral part of a  complete  examination. Limited examinations for reoccurring indications may be  performed  as noted.     Right Findings:  +----------+------------+---------+-----------+----------+-------+  RIGHT     CompressiblePhasicitySpontaneousPropertiesSummary  +----------+------------+---------+-----------+----------+-------+  Subclavian   Full       Yes       Yes                       +----------+------------+---------+-----------+----------+-------+     Left Findings:  +----------+------------+---------+-----------+--------------------+-------  +  LEFT     CompressiblePhasicitySpontaneous     Properties      Summary  +----------+------------+---------+-----------+--------------------+-------  +  IJV          Full       Yes       No                                   +----------+------------+---------+-----------+--------------------+-------  +  Subclavian   Full       Yes       Yes                                  +----------+------------+---------+-----------+--------------------+-------  +  Axillary     Full       Yes       Yes                                  +----------+------------+---------+-----------+--------------------+-------  +  Brachial     Full       Yes       Yes                                  +----------+------------+---------+-----------+--------------------+-------  +  Radial       Full       Yes       Yes                                  +----------+------------+---------+-----------+--------------------+-------  +  Ulnar        Full       Yes       Yes                                  +----------+------------+---------+-----------+--------------------+-------  +  Cephalic     None       No        No     spongy  w/compressionChronic  +----------+------------+---------+-----------+--------------------+-------  +  Basilic      Full       Yes       Yes                                  +----------+------------+---------+-----------+--------------------+-------  +   Chronic Cephalic vein thrombus is occlusive and localized to the proximal  forearm    Summary:    Right:  No  evidence of thrombosis in the subclavian.    Left:  No evidence of deep vein thrombosis in the upper extremity. Findings  consistent  with chronic superficial vein thrombosis involving the left cephalic vein.     *See table(s) above for measurements and observations.    Diagnosing physician: Coral Else MD  Electronically signed by Coral Else MD on 10/07/2022 at 10:07:16 AM.   Final        Labs:  Basic Metabolic Panel: Recent Labs  Lab 09/22/22 0330 09/23/22 0224 09/24/22 1610 09/25/22 0220 09/26/22 0223 09/27/22 0226 09/28/22 0808  NA 137 135 137 139 137 139 138  K 4.5 3.9 4.4 4.3 3.7 4.1 3.9  CL 99 98 100 101 99 102 105  CO2 27 27 26 29 27 25 25   GLUCOSE 86 82 74 77 77 113* 127*  BUN 25* 20 16 12 12 12 8   CREATININE 0.88 0.86 0.70 0.65 0.67 0.70 0.62  CALCIUM 8.8* 8.7* 8.3* 8.7* 8.5* 8.5* 8.7*  MG 2.0 1.9 2.2 2.0 1.8 2.0 1.8    CBC: Recent Labs  Lab 09/25/22 0220 09/26/22 0223 09/27/22 0226  WBC 9.5 9.1 9.7  HGB 9.5* 9.2* 9.5*  HCT 32.0* 30.5* 32.6*  MCV 93.8 92.1 94.2  PLT 286 283 307    CBG: Recent Labs  Lab 09/26/22 1126 09/26/22 1615 09/26/22 2154 09/27/22 0637 09/27/22 1137  GLUCAP 126* 103* 120* 85 79    Brief HPI:   CAIMEN BLECHINGER is a 57 y.o. male  treated at [place] in 2021 for CAD and heart failure now with LVAD. On the day of admission, he was evaluated at the heart failure outpatient clinic, he was found with worsening heart failure and was referred to the ED for further evaluation. Admitted on 08/10/2022 on HF team service. 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 At baseline, mild asterixis bilaterally and slight diminished fine finger movements on the  left.    Underwent R/L cardiac cath showed  low cardiac output and filling pressures. PFT/pulmonary medicine evaluation due to severe COPD. Started advanced therapies due to very labile volume status with any decreases in milrinone or diuretics consistent with stage D heart failure. Dental surgery consulted 8/05 and underwent remaining tooth extractions (#13) on 8/06. Back to cath lab and IABP placed on 8/09 for ongoing cardiogenic shock. Head CT performed on 8/17 showed interval evolution of the right MCA territory infarct with decreased intraparenchymal and intra ventricular hemorrhage. No hydrocephalus or midline shift. LVAD (HeartMate 3) placed 8/13. New left sided weakness noted on 8/14 and code stroke activated. Seen by Dr. Derry Lory. CTA with R MCA M1 occlusion. CTP with a core infarct of 12cc and penumbra of 90cc. Code IR activated and patient taken for mechanical thrombectomy. Extubated on 8/15. Oriented and language fluent with mild dysarthria on 8/18. Left facial droop and unable to lift LLE against gravity. Remained on heparin IV. Course complicated by Enterococcus bacteremia on 9/06 and ID consulted. Will remain on IV antibiotics through 11/04/2022. PO intake not yet optimal. On regular diet. Now on aspirin and coumadin. Delirium persists with hallucinations.    Hospital Course: RIQUELME [place] was admitted to rehab 09/27/2022 for inpatient therapies to consist of PT, ST and OT at least three hours five days a week. Past admission physiatrist, therapy team and rehab RN have worked together to provide customized collaborative inpatient rehab.  Heart failure medication therapy continued with digoxin 0.125 mg daily, mexiletine 250 mg twice daily, sildenafil 20 mg every 8 hours.  Warfarin was dosed as per pharmacy protocol.  Stable blood pressure off midodrine.  Follow-up labs including CBC revealed stable hemoglobin and hematocrit.  Developed intertrigo candidiasis in bilateral groins and treated with nystatin powder.  He complained of dysuria  and urinalysis was obtained and negative for infection.  He remained on broad-spectrum antibiotics biotics for bacteremia.  Developed left upper extremity edema and [facility] thoracic surgical team arranged upper extremity ultrasound which revealed chronic superficial vein thrombosis involving the left cephalic vein.  Initially bothered by poor sleep with frequent urination related to Lasix.  Lasix discontinued 9/24 with improvement in nocturia and overall sleep patterns.  Left upper extremity edema and functionality improving.  Blood pressures were monitored on TID basis and remained relatively stable off home medications.  Rehab course: During patient's stay in rehab weekly team conferences were held to monitor patient's progress, set goals and discuss barriers to discharge. At admission, patient required  He has had improvement in activity tolerance, balance, postural control as well as ability to compensate for deficits. He has had improvement in functional use RUE/LUE  and RLE/LLE as well as improvement in awareness.  PT, OT, ST and RN home health arranged through [facility].     Disposition:  There are no questions and answers to display.         Diet: heart healthy  Special Instructions: No driving, alcohol consumption or tobacco use.  Continue twice a week dressing changes on Monday/Thursday. Next dressing change due 10/19/22   Discuss timing of restarting methotrexate as outpatient.  Allergies as of 09/28/2022       Reactions   Pacerone [amiodarone] Other (See Comments)   Severe tremors   Percocet [oxycodone-acetaminophen] Itching     Med Rec must be completed prior to using this [employer]ART[employer]***        Signed: Milinda Antis 09/28/2022, 1:20 PM

## 2022-09-29 DIAGNOSIS — I428 Other cardiomyopathies: Secondary | ICD-10-CM

## 2022-09-29 DIAGNOSIS — I63511 Cerebral infarction due to unspecified occlusion or stenosis of right middle cerebral artery: Secondary | ICD-10-CM | POA: Diagnosis not present

## 2022-09-29 LAB — PROTIME-INR
INR: 1.7 — ABNORMAL HIGH (ref 0.8–1.2)
Prothrombin Time: 19.9 s — ABNORMAL HIGH (ref 11.4–15.2)

## 2022-09-29 LAB — CULTURE, BLOOD (ROUTINE X 2)
Culture: NO GROWTH
Culture: NO GROWTH
Special Requests: ADEQUATE

## 2022-09-29 MED ORDER — WARFARIN SODIUM 3 MG PO TABS
3.0000 mg | ORAL_TABLET | ORAL | Status: AC
  Administered 2022-09-29: 3 mg via ORAL
  Filled 2022-09-29: qty 1

## 2022-09-29 MED ORDER — TRAZODONE HCL 50 MG PO TABS
50.0000 mg | ORAL_TABLET | Freq: Every day | ORAL | Status: DC
  Administered 2022-09-29 – 2022-10-10 (×12): 50 mg via ORAL
  Filled 2022-09-29 (×13): qty 1

## 2022-09-29 NOTE — Progress Notes (Signed)
Occupational Therapy Session Note  Patient Details  Name: Charles Holmes MRN: [id number] Date of Birth: [birth year]  Today's Date: 09/29/2022 OT Individual Time: 1350-1500 OT Individual Time Calculation (min): 70 min    Short Term Goals: Week 1:  OT Short Term Goal 1 (Week 1): Pt will complete toilet transfers min A using LRAD OT Short Term Goal 2 (Week 1): Pt will recall hemi-dressing techniques with min verbal cues OT Short Term Goal 3 (Week 1): Pt will maintain sternal precautions during BADLs and functional transfers at 95% safe with min verbal cues OT Short Term Goal 4 (Week 1): Pt will maintain dynamic standing balance using LRAD during functional task >1 minute to demonstrate increased activity tolerance  Skilled Therapeutic Interventions/Progress Updates:    Pt greeted semi-reclined in bed and agreeable to OT treatment session. PT completed bed mobility with min A. Sit<>stands at EOB using rocking motion for sternal precautions with min A progressing to mod A with fatigue. Pt completed 3 standing marches in place with RW. Pt reported max fatigue and requested to return to sitting. L UE NMR with OT providing joint input through wrist and hand for full ROM. Good activation of L UE proximally, but weaker distally. OT issued soft yellow theraputty with pt able to grasp putty. Worked on functional pinch with OT assist for isolated movements. OT applied kinesiotape to L hand and arm for edema management. Pt reported need to urinate. Pt needed OT assist to manage brief sitting EOB and then pt could place urinal for continent void. Pt returned to bed with min A and left semi-reclined in bed with bed alarm on, call bell in reach, and needs met.   Therapy Documentation Precautions:  Precautions Precautions: Sternal, Fall Precaution Comments: LVAD Restrictions Weight Bearing Restrictions: Yes RUE Weight Bearing: Non weight bearing RLE Weight Bearing: Non weight bearing Other Position/Activity  Restrictions: Sternal Precautions Pain: Denies pain, reports fatigue   Therapy/Group: Individual Therapy  Mal Amabile 09/29/2022, 2:27 PM

## 2022-09-29 NOTE — Progress Notes (Signed)
Patient ID: Charles Holmes, male   DOB: 03-12-1965, 57 y.o.   MRN: [id number] Met with the patient to review current situation, rehab process, team conference and plan of care. Reviewed secondary risks including CAD, new LVAD, HTN, HLD, HF. Reviewed medications and dietary modification recommendations. Patient noted trouble sleeping with multi interruptions during the night to check machine and vitals. Needs encouragement that he is doing well overall. IV ABX through 11/04/22 for bacteremia and 2 ste entry to home no rails.  Incision is OTA and blisters are healing. Continue to follow along to address educational needs to facilitate preparation for discharge. Pamelia Hoit

## 2022-09-29 NOTE — Progress Notes (Signed)
Speech Language Pathology Daily Session Note  Patient Details  Name: Charles Holmes MRN: [id number] Date of Birth: Apr 16, 1965  Today's Date: 09/29/2022 SLP Individual Time: 1015-1055 SLP Individual Time Calculation (min): 40 min  Short Term Goals: Week 1: SLP Short Term Goal 1 (Week 1): Patient will attend to left field of enviornment during functional tasks with supervision level verbal cues. SLP Short Term Goal 2 (Week 1): Patient will recall sequence to switching to LVAD batteries with Min verbal and visual cues. SLP Short Term Goal 3 (Week 1): Patient will recall reasoning for specific LVAD alarms with Min verbal and visual cues. SLP Short Term Goal 4 (Week 1): Patient will utilize speech intelligibility strategies at the phrase level to achieve ~80% intelligibility with Min verbal cues.  Skilled Therapeutic Interventions: Skilled treatment session focused on communication goals. Upon arrival, patient was awake but reported fatigue. SLP facilitated session by providing education regarding speech intelligibility strategies (over-articulate, slow, and increased vocal intensity) to utilize to maximize overall intelligibility. An external aid was also created to maximize recall of information. Patient utilized the compensatory strategies during an informal conversation at the sentence level with overall Min-Mod verbal cues. SLP remained in patient's left visual field with patient maintaining appropriate eye contact/attention throughout. Patient left upright in bed with alarm on and all needs within reach. Continue with current plan of care.       Pain No reports of pain   Therapy/Group: Individual Therapy  Taleah Bellantoni 09/29/2022, 2:11 PM

## 2022-09-29 NOTE — Progress Notes (Signed)
Patient ID: Charles Holmes, male   DOB: March 19, 1965, 57 y.o.   MRN: [id number]  SW made attempt to call pt's cousin, [name]. Sw unable to leave VM due to VM not being set up. Sw will continue to FU.

## 2022-09-29 NOTE — Progress Notes (Addendum)
PROGRESS NOTE   Subjective/Complaints:  Pt reports feeling "disgusting" this AM- feels like smells like urine- will ask nursing to get him cleaned up.  Mood "somewhat better' but admits some days wants ot give up- LBM yesterday    ROS:   Pt denies SOB, abd pain, CP, N/V/C/D, and vision changes  Objective:   [place] EKG SITE RITE  Result Date: 09/28/2022 If Site Rite image not attached, placement could not be confirmed due to current cardiac rhythm.  Recent Labs    09/27/22 0226  WBC 9.7  HGB 9.5*  HCT 32.6*  PLT 307   Recent Labs    09/27/22 0226 09/28/22 0808  NA 139 138  K 4.1 3.9  CL 102 105  CO2 25 25  GLUCOSE 113* 127*  BUN 12 8  CREATININE 0.70 0.62  CALCIUM 8.5* 8.7*    Intake/Output Summary (Last 24 hours) at 09/29/2022 2956 Last data filed at 09/29/2022 0451 Gross per 24 hour  Intake 1382.62 ml  Output 1150 ml  Net 232.62 ml        Physical Exam: Vital Signs Blood pressure 97/80, pulse 89, temperature 98.8 F (37.1 C), temperature source Oral, resp. rate 16, height 5\' 7"  (1.702 m), weight 80 kg, SpO2 97%.    General: awake, alert, appropriate,  sitting up in bed- flat affect; NAD HENT: conjugate gaze; oropharynx dry CV: LVAD hum-strong Pulmonary: CTA B/L; no W/R/R-  slightly decreased at bases GI: soft, NT, ND, (+)BS- normoactive Psychiatric: appropriate- depressed affect, but better than when saw Tuesday Neurological: Ox3 Extremities; LUE somewhat swollen- likely from stroke- elevated on pillow-  Skin: chest incisions clean, LVAD drive site clean/dressed Neuro:  Alert and oriented x 3. Fair insight and awareness. Intact Memory. Normal language and speech. Cranial nerve exam unremarkable except for mild left central 7. MMT: LUE grossly 3- to 3/5 prox to distal. LLE 3 to 3+. Pt with decreased LT/pain sense on left. No abnl resting tone. DTR's tr to1+.   Musculoskeletal: Full ROM, No pain  with AROM or PROM in the neck, trunk, or extremities. Posture appropriate     Assessment/Plan: 1. Functional deficits which require 3+ hours per day of interdisciplinary therapy in a comprehensive inpatient rehab setting. Physiatrist is providing close team supervision and 24 hour management of active medical problems listed below. Physiatrist and rehab team continue to assess barriers to discharge/monitor patient progress toward functional and medical goals  Care Tool:  Bathing    Body parts bathed by patient: Chest, Left arm, Abdomen, Front perineal area, Face   Body parts bathed by helper: Buttocks, Right upper leg, Left upper leg, Right lower leg, Left lower leg, Right arm     Bathing assist Assist Level: Maximal Assistance - Patient 24 - 49%     Upper Body Dressing/Undressing Upper body dressing   What is the patient wearing?: Pull over shirt    Upper body assist Assist Level: Maximal Assistance - Patient 25 - 49%    Lower Body Dressing/Undressing Lower body dressing      What is the patient wearing?: Pants, Underwear/pull up     Lower body assist Assist for lower body dressing: Maximal Assistance -  Patient 25 - 49%     Toileting Toileting    Toileting assist Assist for toileting: Maximal Assistance - Patient 25 - 49%     Transfers Chair/bed transfer  Transfers assist  Chair/bed transfer activity did not occur: Safety/medical concerns (fatigue)  Chair/bed transfer assist level: Moderate Assistance - Patient 50 - 74%     Locomotion Ambulation   Ambulation assist   Ambulation activity did not occur: Safety/medical concerns (fatigue)          Walk 10 feet activity   Assist  Walk 10 feet activity did not occur: Safety/medical concerns (fatigue)        Walk 50 feet activity   Assist Walk 50 feet with 2 turns activity did not occur: Safety/medical concerns         Walk 150 feet activity   Assist Walk 150 feet activity did not occur:  Safety/medical concerns         Walk 10 feet on uneven surface  activity   Assist Walk 10 feet on uneven surfaces activity did not occur: Safety/medical concerns (fatigue)         Wheelchair     Assist Is the patient using a wheelchair?: Yes   Wheelchair activity did not occur: Safety/medical concerns (fatigue)         Wheelchair 50 feet with 2 turns activity    Assist    Wheelchair 50 feet with 2 turns activity did not occur: Safety/medical concerns       Wheelchair 150 feet activity     Assist  Wheelchair 150 feet activity did not occur: Safety/medical concerns       Blood pressure 97/80, pulse 89, temperature 98.8 F (37.1 C), temperature source Oral, resp. rate 16, height 5\' 7"  (1.702 m), weight 80 kg, SpO2 97%.  Medical Problem List and Plan: 1. Functional deficits secondary to acute right MCA infarct/ LVAD pt             -patient may shower             -ELOS/Goals: modI/S 10-14 days   Con't CIR PT, OT and SLP 2.  Antithrombotics: -DVT/anticoagulation:  Pharmaceutical: Coumadin  9/13- managed by pharmacy- INR 1.7 yesterday             -antiplatelet therapy: Aspirin 81 mg daily   3. Pain Management: Tylenol as needed   4. Mood/Behavior/Sleep: delirium/insomnia - team to provide ongoing ego support             -continue mirtazapine 7.5 mg q HS             -continue sertraline 50 mg daily             -continue melatonin 3 mg q HS             -antipsychotic agents: n/a   9/13- mood somewhat better than when saw pt Tuesday- con't regimen and monitor 5. Neuropsych/cognition: This patient is capable of making decisions on his own behalf.   6. Skin/Wound Care: Routine skin care checks   7. Fluids/Electrolytes/Nutrition: strict Is and Os and follow-up chemistries             -poor PO intake on Marinol             -continue supplements   -9/12 labs reviewed. low albumin still continue interventions above 8: Hypertension: monitor TID and  prn   9/13- BP soft, but denies orthostatic hypotension 9: Hyperlipidemia: continue statin   10: Enterococcus  bacteremia: 6 weeks IV antibiotic, ceftriaxone and ampicillin             -follow-up with ID  -needs PICC   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: Home meds: Coreg 3.125, Lasix 40mg , Lostartan 25mg , Aldactone 12.5, Farxiga 10mg , mexilitene    12: Acute on chronic systolic heart failure with cardiogenic shock s/p LVAD             -daily weights balanced so far             -continue digoxin 0.125 mg daily             -continue mexiletine 250 mg BID             -continue midodrine 5 mg q 8 hours             -continue sildenafil 20 mg q 8 hours             -continue warfarin per pharmacy   9/13- Wgt 80.0 kg today- stable last few days Filed Weights   09/27/22 1831 09/28/22 0500 09/29/22 0452  Weight: 80.1 kg 79.3 kg 80 kg    13: Hx of VT: continue mexiletine, digoxin; has ICD   14: DM-2: A1c = 5.5%; monitor PO intake; DC SSI (Farxiga at home)   15: OSA: needs outpatient sleep study   16: Anemia, iron deficiency: follow-up CBC--most recent hgb 9.5--lab pending still today?   17: Severe COPD:              -continue Ellipta one puff daily   18: s/p tooth extractions this admission: monitor PO tolerance   19: Glaucoma: continue Cosopt and Xalatan    I spent a total of 36   minutes on total care today- >50% coordination of care- due to  Review of chart vitals and labs- new pt- also spoke with nursing to get pt cleaned up- also did IPOC   Got called- pt not sleeping well- added Trazodone 50 mg QHS LOS: 2 days A FACE TO FACE EVALUATION WAS PERFORMED  Anan Dapolito 09/29/2022, 8:08 AM

## 2022-09-29 NOTE — Progress Notes (Signed)
Physical Therapy Session Note  Patient Details  Name: Charles Holmes MRN: [id number] Date of Birth: 1965/08/21  Today's Date: 09/29/2022 PT Individual Time: 4098-1191 PT Individual Time Calculation (min): 13 min And  PT Amount of Missed Time (min): 17 Minutes PT Missed Treatment Reason: Patient fatigue   Short Term Goals: Week 1:  PT Short Term Goal 1 (Week 1): Pt will require min A consisntely for bed mobility PT Short Term Goal 2 (Week 1): Pt will require mod A with sit to stand with LRAD PT Short Term Goal 3 (Week 1): Pt will require max A x 1 for gait x 10 ft with LRAD  Skilled Therapeutic Interventions/Progress Updates:  Patient supine in bed asleep on entrance to room. Patient requires time to rouse , then alert and agreeable to PT session.   Patient with no pain complaint at start of session.  Started with education and attempt to quiz pt on steps involved to switch LVAD from powered unit to battery packs. Pt attempts to participate initially but continuously falling asleep and unable to maintain eyes open. Attempts to wake not effective and pt left to sleep 2/2 fatigue.   Patient supine in bed at end of session with brakes locked, bed alarm set, and all needs within reach.   Therapy Documentation Precautions:  Precautions Precautions: Sternal, Fall Precaution Comments: LVAD Restrictions Weight Bearing Restrictions: Yes RUE Weight Bearing: Non weight bearing RLE Weight Bearing: Non weight bearing Other Position/Activity Restrictions: Sternal Precautions  Pain:  No pain noted this session.   Therapy/Group: Individual Therapy  Loel Dubonnet PT, DPT, CSRS 09/29/2022, 5:38 PM

## 2022-09-29 NOTE — Progress Notes (Signed)
Patient ID: Charles Holmes, male   DOB: [birth year], 57 y.o.   MRN: [id number]  Sw left VM for daughter, Dorathy Daft. Sw will continue to follow up.

## 2022-09-29 NOTE — Progress Notes (Signed)
Physical Therapy Session Note  Patient Details  Name: Charles Holmes MRN: [id number] Date of Birth: [birth year]  Today's Date: 09/29/2022 PT Individual Time: 0903-1000 PT Individual Time Calculation (min): 57 min   Short Term Goals: Week 1:  PT Short Term Goal 1 (Week 1): Pt will require min A consisntely for bed mobility PT Short Term Goal 2 (Week 1): Pt will require mod A with sit to stand with LRAD PT Short Term Goal 3 (Week 1): Pt will require max A x 1 for gait x 10 ft with LRAD  Skilled Therapeutic Interventions/Progress Updates:  Patient supine in bed on entrance to room. Patient alert and agreeable to PT session. RN present and providing morning medicines. Pt has breakfast but has not been able to eat it yet.   Patient with no pain complaint at start of session.  Therapeutic Activity: Bed Mobility: Pt performed supine > sit requiring guidance for log roll technique Overall ModA for sequencing technique and then pushing up with elbow on bed surface. Follows instructions well and is able to bring self to upright position on EOB. Sits during provision of medication and throughout eating about half of breakfast. Sitting tolerance is limited but is able to manage upright seated position with no assist for balance but positioning of L hand to palm down on surface of bed for LUE joint approximation. Pt tires after ~37min sitting upright. VC/ tc required for returning to supine and pt able to perform with light MinA.   D/t decreased activity tolerance, pt unable to stand during this session and standing/ amb postponed to next session.   Pt relates desire for shorts and cellphone. Neither can be located in room. Nursing notified to request from H&V nursing.   Neuromuscular Re-ed: NMR facilitated during session with focus onLUE motor control. Pt guided in exercises using foam ball and lifting arm to reach then grasp. Grasp improving during 2nd trial with pt able to hold ball, then pronate to  release grasp of ball to therapist's hand. Provided pt with practice ends to LVAD cords to battery pack. Pt is able to unscrew connection once loosened from full tightening. Pt requires most assist with holding end with L hand and correctly matching direction of end in R hand. Can screw ends together but not all the way to fully tightened position.   Pt also guided in finger dexterity with thumb and fingertip touch to index, middle, and ring finger with guidance.   NMR performed for improvements in motor control and coordination, balance, sequencing, judgement, and self confidence/ efficacy in performing all aspects of mobility at highest level of independence.   Therapeutic Exercise: Pt performed the following exercises x15 with vc/ tc for proper technique. BLE SLR with focus on inhale to prepare, exhale on lift of LE and inhale again on eccentric lowering.  BLE Heel slides with increased focus on performance with LLE.  BLE straight leg Abd/ Add with focus on performance with LLE.   Pt is able to assist with move to [facility] with positioning BLE into hooklying position and pushing with BLE to assist. Requires Min/ ModA for positioning.   Patient supine in bed at end of session with brakes locked, bed alarm set, and all needs within reach.   Therapy Documentation Precautions:  Precautions Precautions: Sternal, Fall Precaution Comments: LVAD Restrictions Weight Bearing Restrictions: Yes RUE Weight Bearing: Non weight bearing RLE Weight Bearing: Non weight bearing Other Position/Activity Restrictions: Sternal Precautions  Pain: No pain related this session.  Therapy/Group: Individual Therapy  Loel Dubonnet 09/29/2022, 7:57 AM

## 2022-09-29 NOTE — Progress Notes (Addendum)
Patient ID: JESSEY FRAGA, male   DOB: 30-Nov-1965, 57 y.o.   MRN: [id number]     Advanced Heart Failure Rounding Note  PCP-Cardiologist: Norman Herrlich, MD  [facility]: Dr. Gala Romney   Subjective:    Admitted to CIR on 09/11  Appetite continues to improve. .  No further hallucinations.  MAP 80s-90s.  INR 1.7 9/12  In better spirits this morning. Continues to work hard with therapy, at times does feels defeated because he doesn't feel like he's making progress but staff says otherwise.   LVAD Interrogation HM III: Speed: 5450  Flow: 4.0  PI: 5.7 Power: 3.9 No PI events so far this am. VAD interrogated personally. Parameters stable.  Objective:   Weight Range: 80 kg Body mass index is 27.62 kg/m.   Vital Signs:   Temp:  [98.5 F (36.9 C)-98.8 F (37.1 C)] 98.8 F (37.1 C) (09/13 0400) Pulse Rate:  [60-92] 89 (09/13 0400) Resp:  [16-20] 16 (09/13 0400) BP: (88-121)/(75-87) 97/80 (09/13 0400) SpO2:  [95 %-99 %] 97 % (09/13 0400) Weight:  [80 kg] 80 kg (09/13 0452) Last BM Date : 09/26/22  Weight change: Filed Weights   09/27/22 1831 09/28/22 0500 09/29/22 0452  Weight: 80.1 kg 79.3 kg 80 kg   Intake/Output:   Intake/Output Summary (Last 24 hours) at 09/29/2022 0849 Last data filed at 09/29/2022 0451 Gross per 24 hour  Intake 1382.62 ml  Output 1150 ml  Net 232.62 ml   Physical Exam  General:  Well appearing. No resp difficulty HEENT: Normal Neck: supple. JVP ~7. Carotids 2+ bilat; no bruits. No lymphadenopathy or thyromegaly appreciated. Cor: Mechanical heart sounds with LVAD hum present. Midline scar healing very well.  Lungs: Clear Abdomen: soft, nontender, nondistended. No hepatosplenomegaly. No bruits or masses. Good bowel sounds. Driveline: C/D/I; securement device intact. Gauze dressing over DL exit site Extremities: no cyanosis, clubbing, rash, edema Neuro: alert & orientedx3, cranial nerves grossly intact. moves all 4 extremities w/o difficulty. Affect  pleasant  Labs    CBC Recent Labs    09/27/22 0226  WBC 9.7  HGB 9.5*  HCT 32.6*  MCV 94.2  PLT 307    Basic Metabolic Panel Recent Labs    95/63/87 0226 09/28/22 0808  NA 139 138  K 4.1 3.9  CL 102 105  CO2 25 25  GLUCOSE 113* 127*  BUN 12 8  CREATININE 0.70 0.62  CALCIUM 8.5* 8.7*  MG 2.0 1.8    BNP: BNP (last 3 results) Recent Labs    09/11/22 2252 09/18/22 2246 09/25/22 2351  BNP 933.0* 809.0* 851.4*   Imaging   [place] EKG SITE RITE  Result Date: 09/28/2022 If Site Rite image not attached, placement could not be confirmed due to current cardiac rhythm.   Medications:     Scheduled Medications:  aspirin EC  81 mg Oral Daily   atorvastatin  80 mg Oral Daily   digoxin  0.125 mg Oral Daily   dorzolamide-timolol  1 drop Both Eyes BID   feeding supplement  237 mL Oral TID BM   Gerhardt's butt cream   Topical TID   influenza vac split trivalent PF  0.5 mL Intramuscular Tomorrow-1000   latanoprost  1 drop Both Eyes QHS   melatonin  3 mg Oral QHS   mexiletine  250 mg Oral BID   mometasone-formoterol  2 puff Inhalation BID   multivitamin with minerals  1 tablet Oral Daily   pantoprazole  40 mg Oral Daily   sertraline  50 mg Oral Daily   sildenafil  20 mg Oral TID   umeclidinium bromide  1 puff Inhalation Daily   Warfarin - Pharmacist Dosing Inpatient   Does not apply q1600    Infusions:  sodium chloride 250 mL (09/28/22 0903)   ampicillin (OMNIPEN) IV 2 g (09/29/22 0451)   cefTRIAXone (ROCEPHIN)  IV 2 g (09/29/22 0820)    PRN Medications: sodium chloride, acetaminophen, albuterol, alum & mag hydroxide-simeth, bisacodyl, guaiFENesin-dextromethorphan, ondansetron **OR** ondansetron (ZOFRAN) IV, mouth rinse, polyethylene glycol, sodium chloride, sodium chloride flush, sodium phosphate, traMADol   Patient Profile  Mr Forney is a 57 y.o. 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  Admitted with cardiogenic shock, stabilized and underwent HM3 LVAD. Post implant course c/b acute CVA and enterococcus faecalis bacteremia.   Assessment/Plan  1.  Acute on chronic Systolic HF-->Cardiogenic Shock  - Diagnosed 11/2019. Presented with VT. LHC 70% LAD  - cMRI 12/21 concerning for sarcoid and EF 18%.  - PET 2/[facility] at [facility] EF 25% + active sarcoid - Echo 08/26/20 EF < 20% severely dilated LV RV mildly decreased.  - [employer] CRT-D upgrade in 06/08/21 - Echo 07/10/22: EF <20%, RV okay, mod pericardial effusion, mod MR/TR - Admitted 07/25 with cardiogenic shock. - RHC: Nonobstructive CAD, severely elevated filling pressures and low Fick CO/CI (2.7/1.4) - 08/13 HM III LVAD implant + clipping LAA d/t severe thickening and invagination of mitral valve annulus impeding flows.  - Apical core sent - no mention of sarcoid - Speed increased to 5300 on 08/14.  - Echo 8/26 mod-sev RV dysfunction. Speed increased to 5400 8/26 - RHC 8/27: low filing pressures with excellent cardiac output on EPI, PAPi 4.6 - Ramp Echo 9/4, speed increased to 5400  - MAPs 80s-90s. Stable off midodrine. - Volume stable. Lasix PRN. - Continue digoxin 0.125 mg daily  2. HM-3 LVAD - VAD interrogated personally. Parameters stable. - LDH stable - Continue ASA 81 - INR 1.7. Suspect d/t change in diet.  Discussed Warfarin dosing with PharmD personally.  3.  Acute stroke - Hx CVA 06/24 -Admitted 06/24 w/ R MCA stroke. S/p TPA and mechanical clot extraction. No residual deficits. Likely cardioembolic in setting of severe LV dysfunction. - Developed left sided weakness 08/14. CTA with R MCA infarct. Taken to IR for thrombectomy - Repeat CT head with small to moderate size hemorrhagic conversion.  - repeat head CT on 8/17 w/ improved hemorrhagic CVA - Back on warfarin. INR 1.7. - neurology signed off  - Continue PT/OT - Continue ASA 81 mg daily - Appreciate CIR.  4. Enterococcus faecalis bacteremia - Bcx 2/2 on  9/6 - ID consult 9/6 -> ampicillin and ceftriaxone - TEE recommended but doubt it will change management as VAD cannot be removed and will require IV abx followed by long-term suppression - Echo 09/10 - no obvious vegetations - PICC out - Repeat BCx 9/8 NGTD. - ID recommending 6 weeks IV amp/ceftriaxone followed by long-term suppression with amoxicillin. Will need to place PICC line if discharges home before he completes course of IV abx.  5. Hx VT - ln setting of potential sarcoid heart disease  - Off amio due to tremor. Continue mexiletine  - now s/p ICD.   6. CAD - LHC 12/07/19 70-% LAD, no intervention - LHC 8/24 non obstructive CAD.  - Continue statin. On aspirin for VAD. - No s/s angina  7. 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  - holding methotrexate w/ recent surgery and active infection, can discuss timing of restarting the medication at outpatient follow-up - apical core pathology not diagnostic of cardiac sarcoidosis.   8. Paroxsymal AT/AF - Rhythm regular - On Warfarin   9. AKI - suspect cardiorenal, improved w/ inotropic support - Creatinine stable now   10. Iron deficiency anemia/ Post-op anemia - recent T sat 15%, scheduled for OP feraheme. Will complete inpatient  - Transfused 1 u RBCs 8/15 - Hgb at 9.5   11. Pulmonary  - PFTs with severe obstructive defect, response to bronchodilator. FEV1 1.04L, FEV1/FVC 48% - Stable  12. Psych - Depression and delirium post VAD implant. Prolonged hospital course.  - Improving - Continue sertraline   Brynda Peon, AGACNP-BC  8:49 AM  Patient seen with PA/NP, agree with the above note.    Subjective: -Resting comfortably.  No complaints   Exam: General: NAD HEENT: Normal.  Neck: Thick neck. No JVD, no thyromegaly or thyroid nodule.  Lungs: Clear to auscultation bilaterally with normal respiratory effort. CV: normal LVAD hum  Abdomen: Soft, nontender,  no hepatosplenomegaly, no distention.  Skin: Intact without lesions or rashes.  Neurologic: awake/alert, no gross FND.  Psych: Normal affect. Extremities: No clubbing or cyanosis.    LVAD Interrogation HM III: Speed: 5450  Flow: 4.0  PI: 5.7 Power: 3.9 No PI events so far this am. VAD interrogated personally. Parameters stable.   A/P -Euvolemic on exam.  INR pending.  Discussed with Pharm.D.  Doing well and CIR is making good progress.     Alter Moss Advanced Heart Failure

## 2022-09-29 NOTE — Progress Notes (Signed)
This chaplain is present with the Pt. for F/U spiritual care.   The Pt. is finishing pancakes and bacon as the chaplain engages the Pt. in the events of his day.  The Pt. Appropriately responds and intersects pieces of his personality in the conversation. The Pt. is aware of today's therapy schedule and updates the chaplain on the upcoming visit. The Pt. asks the chaplain about the different types of therapy and what he will be working on today. The chaplain encourages the Pt. to ask questions as he works with the different therapies.  Education is provided to the Pt. on how to request a chaplain as needed before ending the visit.  Chaplain Stephanie Acre [phone]

## 2022-09-29 NOTE — Progress Notes (Deleted)
Patient seen with PA/NP, agree with the above note.   Subjective: -Resting comfortably.  No complaints   Exam: General: NAD HEENT: Normal.  Neck: Thick neck. No JVD, no thyromegaly or thyroid nodule.  Lungs: Clear to auscultation bilaterally with normal respiratory effort. CV: normal LVAD hum  Abdomen: Soft, nontender, no hepatosplenomegaly, no distention.  Skin: Intact without lesions or rashes.  Neurologic: awake/alert, no gross FND.  Psych: Normal affect. Extremities: No clubbing or cyanosis.   LVAD Interrogation HM III: Speed: 5450  Flow: 4.0  PI: 5.7 Power: 3.9 No PI events so far this am. VAD interrogated personally. Parameters stable.  A/P -Euvolemic on exam.  INR pending.  Discussed with Pharm.D.  Doing well and CIR is making good progress.   Charles Holmes Advanced Heart Failure

## 2022-09-29 NOTE — IPOC Note (Signed)
Overall Plan of Care [facility]) Patient Details Name: Charles Holmes MRN: [id number] DOB: [birth year]  Admitting Diagnosis: Acute right MCA stroke [facility])  Hospital Problems: Principal Problem:   Acute right MCA stroke (HCC)     Functional Problem List: Nursing Bladder, Safety, Skin Integrity, Endurance, Medication Management, Motor, Pain  PT Balance, Nutrition, Skin Integrity, Edema, Endurance, Motor, Perception  OT Balance, Behavior, Safety, Perception, Cognition, Skin Integrity, Endurance, Motor, Pain, Edema  SLP Cognition  TR         Basic ADL's: OT Toileting, Dressing, Bathing, Grooming, Eating     Advanced  ADL's: OT       Transfers: PT Bed Mobility, Bed to Chair, Car  OT Toilet     Locomotion: PT Ambulation, Wheelchair Mobility     Additional Impairments: OT Fuctional Use of Upper Extremity  SLP Communication, Social Cognition expression Problem Solving  TR      Anticipated Outcomes Item Anticipated Outcome  Self Feeding Set-up  Swallowing      Basic self-care  Supervision-CGA  Toileting  Min A   Bathroom Transfers Supervision-CGA  Bowel/Bladder  regain continence of bowel and bladder  Transfers  supervsion  Locomotion  CGA  Communication  Mod I  Cognition  Mod I  Pain  less than 4  Safety/Judgment  safely manage LVAD   Therapy Plan: PT Intensity: Minimum of 1-2 x/day ,45 to 90 minutes PT Frequency: 5 out of 7 days PT Duration Estimated Length of Stay: 2-3 weeks OT Intensity: Minimum of 1-2 x/day, 45 to 90 minutes OT Frequency: 5 out of 7 days OT Duration/Estimated Length of Stay: 2 weeks SLP Intensity: Minumum of 1-2 x/day, 30 to 90 minutes SLP Frequency: 1 to 3 out of 7 days SLP Duration/Estimated Length of Stay: 2-3 weeks   Team Interventions: Nursing Interventions Patient/Family Education, Medication Management, Psychosocial Support, Bladder Management, Skin Care/Wound Management, Disease Management/Prevention, Pain Management,  Discharge Planning  PT interventions Ambulation/gait training, Cognitive remediation/compensation, Discharge planning, DME/adaptive equipment instruction, Functional mobility training, Psychosocial support, Splinting/orthotics, Therapeutic Activities, UE/LE Strength taining/ROM, UE/LE Coordination activities, Therapeutic Exercise, Stair training, Patient/family education, Neuromuscular re-education, Disease management/prevention, Community reintegration, Warden/ranger  OT Interventions Warden/ranger, Discharge planning, Pain management, Self Care/advanced ADL retraining, Therapeutic Activities, UE/LE Coordination activities, Cognitive remediation/compensation, Disease mangement/prevention, Skin care/wound managment, Patient/family education, Functional mobility training, Community reintegration, Fish farm manager, Neuromuscular re-education, Psychosocial support, Splinting/orthotics, Wheelchair propulsion/positioning, Visual/perceptual remediation/compensation, Therapeutic Exercise, UE/LE Strength taining/ROM  SLP Interventions Cognitive remediation/compensation, Financial trader, Environmental controls, Functional tasks, Internal/external aids, Patient/family education, Speech/Language facilitation  TR Interventions    SW/CM Interventions     Barriers to Discharge MD  Medical stability, Home enviroment access/loayout, IV antibiotics, Wound care, Lack of/limited family support, Weight, Weight bearing restrictions, and mood  Nursing Decreased caregiver support, Home environment access/layout, IV antibiotics, Incontinence, Wound Care, Weight bearing restrictions, Medication compliance home with family 1 level 2 ste no rails  PT Home environment access/layout discharge to cousins home  OT      SLP Decreased caregiver support    SW       Team Discharge Planning: Destination: PT-Home ,OT- Home (home with cousin) , SLP-Home Projected Follow-up: PT-Home  health PT, OT-  Home health OT, SLP-Home Health SLP, 24 hour supervision/assistance Projected Equipment Needs: PT-To be determined, OT- To be determined, SLP-None recommended by SLP Equipment Details: PT-no DME, OT-  Patient/family involved in discharge planning: PT- Patient,  OT-Patient, SLP-Patient  MD ELOS: 2-3 weeks Medical Rehab Prognosis:  Good Assessment: The patient  has been admitted for CIR therapies with the diagnosis of new LVAD and  R MCA stroke. The team will be addressing functional mobility, strength, stamina, balance, safety, adaptive techniques and equipment, self-care, bowel and bladder mgt, patient and caregiver education, LVAD education and IV ABX x 6 weeks- warfarin mgmt. Goals have been set at Supervision to Min A. Anticipated discharge destination is home with family.        See Team Conference Notes for weekly updates to the plan of care

## 2022-09-29 NOTE — Progress Notes (Addendum)
Inpatient Rehabilitation Care Coordinator Assessment and Plan Patient Details  Name: Charles Holmes MRN: [id number] Date of Birth: [birth year]  Today's Date: 09/29/2022  Hospital Problems: Principal Problem:   Acute right MCA stroke [facility])  Past Medical History:  Past Medical History:  Diagnosis Date   CAD (coronary artery disease)    CHF (congestive heart failure) (HCC)    COPD (chronic obstructive pulmonary disease) (HCC)    GERD (gastroesophageal reflux disease)    Hyperlipidemia    Hypertension    LVAD (left ventricular assist device) present (HCC)    Stroke (HCC)    Systolic heart failure (HCC) 2021   LVEF 18%, RVEF 38% on cardiac MRI 12/19/2019. possible cardiac sarcoidosis.   Wide-complex tachycardia 2021   wears LifeVest   Past Surgical History:  Past Surgical History:  Procedure Laterality Date   BIV UPGRADE N/A 06/07/2021   Procedure: BIV ICD UPGRADE;  Surgeon: Regan Lemming, MD;  Location: [facility] INVASIVE CV LAB;  Service: Cardiovascular;  Laterality: N/A;   CLIPPING OF ATRIAL APPENDAGE Left 08/29/2022   Procedure: CLIPPING OF ATRIAL APPENDAGE;  Surgeon: Lovett Sox, MD;  Location: [facility] OR;  Service: Open Heart Surgery;  Laterality: Left;   IABP INSERTION N/A 08/25/2022   Procedure: IABP Insertion;  Surgeon: Dolores Patty, MD;  Location: [facility] INVASIVE CV LAB;  Service: Cardiovascular;  Laterality: N/A;   ICD IMPLANT N/A 02/20/2020   Procedure: ICD IMPLANT;  Surgeon: Regan Lemming, MD;  Location: [facility] INVASIVE CV LAB;  Service: Cardiovascular;  Laterality: N/A;   INSERTION OF IMPLANTABLE LEFT VENTRICULAR ASSIST DEVICE N/A 08/29/2022   Procedure: INSERTION OF IMPLANTABLE LEFT VENTRICULAR ASSIST DEVICE;  Surgeon: Lovett Sox, MD;  Location: [facility] OR;  Service: Open Heart Surgery;  Laterality: N/A;   IR CT HEAD LTD  07/10/2022   IR CT HEAD LTD  08/30/2022   IR PERCUTANEOUS ART THROMBECTOMY/INFUSION INTRACRANIAL INC DIAG ANGIO  07/10/2022   IR PERCUTANEOUS ART  THROMBECTOMY/INFUSION INTRACRANIAL INC DIAG ANGIO  08/30/2022   IR US GUIDE VASC ACCESS LEFT  08/30/2022   IR US GUIDE VASC ACCESS RIGHT  07/10/2022   RADIOLOGY WITH ANESTHESIA N/A 07/10/2022   Procedure: RADIOLOGY WITH ANESTHESIA;  Surgeon: Radiologist, Medication, MD;  Location: [facility] OR;  Service: Radiology;  Laterality: N/A;   RADIOLOGY WITH ANESTHESIA N/A 08/30/2022   Procedure: IR WITH ANESTHESIA;  Surgeon: Julieanne Cotton, MD;  Location: [facility] OR;  Service: Radiology;  Laterality: N/A;   REMOVAL OF IMPELLA LEFT VENTRICULAR ASSIST DEVICE N/A 08/29/2022   Procedure: REMOVAL OF INTRA-AORTIC BALLON PUMP;  Surgeon: Lovett Sox, MD;  Location: [facility] OR;  Service: Open Heart Surgery;  Laterality: N/A;   RIGHT HEART CATH N/A 07/14/2022   Procedure: RIGHT HEART CATH;  Surgeon: Dolores Patty, MD;  Location: [facility] INVASIVE CV LAB;  Service: Cardiovascular;  Laterality: N/A;   RIGHT HEART CATH N/A 08/25/2022   Procedure: RIGHT HEART CATH;  Surgeon: Dolores Patty, MD;  Location: [facility] INVASIVE CV LAB;  Service: Cardiovascular;  Laterality: N/A;   RIGHT HEART CATH N/A 09/12/2022   Procedure: RIGHT HEART CATH;  Surgeon: Laurey Morale, MD;  Location: [facility] INVASIVE CV LAB;  Service: Cardiovascular;  Laterality: N/A;   RIGHT/LEFT HEART CATH AND CORONARY ANGIOGRAPHY N/A 12/16/2019   Procedure: RIGHT/LEFT HEART CATH AND CORONARY ANGIOGRAPHY;  Surgeon: [place], Peter M, MD;  Location: [facility] INVASIVE CV LAB;  Service: Cardiovascular;  Laterality: N/A;   RIGHT/LEFT HEART CATH AND CORONARY ANGIOGRAPHY N/A 08/10/2022   Procedure: RIGHT/LEFT HEART CATH  AND CORONARY ANGIOGRAPHY;  Surgeon: Dolores Patty, MD;  Location: [facility] INVASIVE CV LAB;  Service: Cardiovascular;  Laterality: N/A;   TEE WITHOUT CARDIOVERSION N/A 08/29/2022   Procedure: TRANSESOPHAGEAL ECHOCARDIOGRAM;  Surgeon: Lovett Sox, MD;  Location: [facility] OR;  Service: Open Heart Surgery;  Laterality: N/A;   TOOTH EXTRACTION N/A 08/22/2022   Procedure: DENTAL  RESTORATION/EXTRACTIONS;  Surgeon: Ocie Doyne, DMD;  Location: [facility] OR;  Service: Oral Surgery;  Laterality: N/A;   Social History:  reports that he has quit smoking. His smoking use included cigarettes. He has never used smokeless tobacco. He reports current alcohol use of about 6.0 standard drinks of alcohol per week. He reports current drug use. Drug: Marijuana.  Family / Support Systems Marital Status: Divorced How Long?: n/a Patient Roles: Parent Spouse/Significant Other: n/a Children: [name], Daughter Other Supports: Clent Demark and Dewayne Anticipated Caregiver: [name] and Dewayne Ability/Limitations of Caregiver: none Caregiver Availability: 24/7 Family Dynamics: support for daughter and cousins  Social History Preferred language: English Religion: Christian Cultural Background: Independen prior Education: HS Health Literacy - How often do you need to have someone help you when you read instructions, pamphlets, or other written material from your doctor or pharmacy?: Never Writes: Yes Employment Status: Unemployed Marine scientist Issues: n/a Guardian/Conservator: n/a   Abuse/Neglect Abuse/Neglect Assessment Can Be Completed: Yes Physical Abuse: Denies Verbal Abuse: Denies Sexual Abuse: Denies Exploitation of patient/patient's resources: Denies Self-Neglect: Denies  Patient response to: Social Isolation - How often do you feel lonely or isolated from those around you?: Never  Emotional Status Pt's affect, behavior and adjustment status: Pleasant. Patient tired. Recent Psychosocial Issues: Coping Psychiatric History: N/a Substance Abuse History: N/a  Patient / Family Perceptions, Expectations & Goals Pt/Family understanding of illness & functional limitations: Yes, made attempt to call daughter Premorbid pt/family roles/activities: Independent overall, driving, cooking and cleaning. Anticipated changes in  roles/activities/participation: Patient daughter and cousin to assit at discharge. Pt/family expectations/goals: Supervision to [employer]: None Premorbid Home Care/DME Agencies: None Transportation available at discharge: Cousins able to transport Is the patient able to respond to transportation needs?: Yes In the past 12 months, has lack of transportation kept you from medical appointments or from getting medications?: No In the past 12 months, has lack of transportation kept you from meetings, work, or from getting things needed for daily living?: No Resource referrals recommended: Neuropsychology  Discharge Planning Living Arrangements: Alone Support Systems: Manufacturing engineer, Other relatives Type of Residence: Private residence Insurance Resources: [employer] (specify county), Media planner (specify) (UHC MEDICARE) Surveyor, quantity Resources: Family Support, Social Security Financial Screen Referred: No Living Expenses: Rent Money Management: Patient Does the patient have any problems obtaining your medications?: No Home Management: Independent prior Patient/Family Preliminary Plans: Plans to remain independent. If unable cousins able to assist at d/c. Care Coordinator Barriers to Discharge: Decreased caregiver support, Lack of/limited family support, Insurance for SNF coverage Care Coordinator Barriers to Discharge Comments: LVAD, IV ABX UTIL 10/19 (DISCHARGING HOME WITH COUSINS) Care Coordinator Anticipated Follow Up Needs: HH/OP DC Planning Additional Notes/Comments: LVAD, IV ABX UTIL 10/19 (DISCHARGING HOME WITH COUSINS) Expected length of stay: 14-20 Days  Clinical Impression SW completed assessments based on pre admission. SW made several attempts to reach out to family. Sw will continue to FU.   Per chart patient discharging home with his cousin to assist and PRN assist from daughter. Patient living alone prior. The plan is for pt's cousins  to assist 24/7. Sw waiting to  confirm with cousin.  Patient has LVAD and IV AX until 10/19.   Charles Holmes 09/29/2022, 10:42 AM

## 2022-09-29 NOTE — Progress Notes (Signed)
LVAD Coordinator Rounding Note:  Admitted 08/10/22 due to acute on chronic CHF with cardiogenic shock. Milrinone dependent. Advance therapy workup completed, and pt deemed acceptable VAD candidate. Dental extractions completed 8/6. IABP placed 08/25/22.  HM 3 LVAD implanted on 08/29/22 by Dr Donata Clay under destination therapy criteria. 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 Result negative.  7/25 Admit with cardiogenic shock. Started milrinone and NE. 8/6 S/P 13 teeth extractions  8/9 IABP placed 8/13 S/p HM III LVAD implant + clipping left atrial appendage d/t severe thickening and invagination of mitral valve annulus impeding flows  CT Head 8/14 (initial) 1. 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 ASPECTS is 7. 2. No acute hemorrhage.  CT Angio Head/Neck 1. 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 2. Core infarct of 12 cc (somewhat underestimated compared to aspects) with 90 cc of penumbra. 3. Mild atherosclerosis.  Pt taken emergently to IR for percutaneous right common carotid arteriogram with thrombectomy. Revascularization achieved. Angio-seal closure device applied to left groin- clean, dry, and intact.   CT Head 8/15 @ 0450 Unchanged extent of infarct and hemorrhage in the right MCA distribution including small volume intraventricular clot. No hydrocephalus.  CT Head 8/17 @ 0701 Interval evolution of the right MCA territory infarct with decreased intraparenchymal and intraventricular hemorrhage. No hydrocephalus or midline shift.   Pt lying in bed this morning in good spirits. Plan to start therapy at 0915.  Blood cultures from 9/6 positive for Enterococcus Faecalis. Subsequent blood cultures NGTD. ID following recommending 6 weeks of IV Ampicillin and Ceftriaxone followed by chronic suppression with Amoxicillin.   VAD coordinator/nurses working with pt on changing power sources. He is unable to do this due  to his left sided weakness. Modification of his power cords was made in hopes he can grip the cords better. Has practice power cords for use by pt. See below for education documentation.   Pt may not shower at this time.   Vital signs: Temp: 98.8 HR: 89 Doppler Pressure: 80 Automatic BP: 106/84 (86) O2 Sat: 97% on RA Wt: 183.6>191.1>190.9>190.2>184.8>...>163.1>162.5>171.3>172.4>176.6> 180.6>175.1>174.8>176.3bs   LVAD interrogation reveals:  Speed: 5400 Flow: 4.2 Power: 4 w PI: 5.3  Alarms: none Events: none  Hematocrit: 32 Fixed speed: 5400 Low speed limit: 5100  Drive Line: Existing VAD dressing removed and site care performed using sterile technique. Drive line exit site cleaned with Chlora prep applicators x 2, allowed to dry, and Vashe moistened 2x2 placed around driveline then covered with dry 4x4. Exit site healed and incorporated, the velour is fully implanted at exit site. Scant amount of serous drainage.Slight redness, no tenderness, foul odor or rash noted. Proximal suture REMOVED TODAY. Drive line anchor re-applied. Pt denies fever or chills. Advance to twice a week dressing changes on Monday/Thursday. Next dressing change due 10/02/22 by VAD coordinator or nurse champion only.       Labs:  LDH trend: 596>441>165>384>345>312>312>350>327>309>316>305>298>277>273>298>280>285>251>252  INR trend: 1.4>1.6>1.1>1.1>1.3>1.2>1>1.3>1.4>1.5>1.6>2.8>2.8>2.3>2.2>2.3>2.6>2.6>1.7  AST/ALT trend: 218/45>96/30>73/23>63/36>58/37>69/50>93/72>123/103>143/150>117/138>119/138>92/127>87/123>50/71>46/65>46/43  Total Bili trend: 4.4>4.6>5.1>1.8>1.9>1.2>1.0>1.5>1.1>0.8>0.9>1.0>0.8  WBC trend: 11.6>13.7>19.6>16.1>23>26.6>21>24.6>18.1>16.5>13.2>13.1>12.5>11.6>10.5>10.8>13.1>9.5>9.1>9.7  Anticoagulation Plan: -INR Goal: 2.0 - 2.5 -ASA Dose: 81 mg  - Coumadin dosing per pharmacy  Blood Products:  IntraOp 8/14: - 4 FFP - 2 Platelets - 2 PRBC - 1 cyro - 449 cc of cellsaver - DDAVP 20  mcg x 1   Device: Medtronic BiV -Therapies: OFF; [employer] rep contacted to turn therapies back on post-surgery  Arrythmias:   Respiratory:   Infection:  09/01/22>> sputum  cx>> NGTD>>final 09/01/22>> blood cx>> NGTD>>final 09/01/22>> urine cx>> NGTD>> final 09/13/22>>blood cxs>>no growth 5 days; final 09/22/22>>blood cxs>> Enterococcus faecalis  09/23/22>> blood cxs>> neg final 09/24/22>> blood cxs>> neg final  Renal:  9/12: BUN/CRT: 8/0.62  Adverse Events on VAD: - 08/30/22: - Developed left sided weakness this am. CTA with R MCA infarct. Taken to IR for thrombectomy   Drips:    Patient Education: Discharge teaching completed with pt's [name] on 9/5. See separate note for documentation.   Plan/Recommendations:  Please page VAD coordinator for any alarms or VAD equipment issues. Advance to twice a week dressing changes on Monday/Thursday by VAD coordinator, Nurse Alla Feeling or [place].  Carlton Adam RN,BSN VAD Coordinator  Office: [phone]  24/7 Pager: [phone]

## 2022-09-29 NOTE — Progress Notes (Signed)
  Subjective: Happy to get pancakes for breakfast Getting ready for PT session  Objective: Vital signs in last 24 hours: Temp:  [98.5 F (36.9 C)-98.8 F (37.1 C)] 98.8 F (37.1 C) (09/13 0400) Pulse Rate:  [60-89] 89 (09/13 0400) Resp:  [16-20] 16 (09/13 0400) BP: (88-106)/(75-87) 106/84 (09/13 0800) SpO2:  [95 %-99 %] 97 % (09/13 0400) Weight:  [80 kg] 80 kg (09/13 0452)  Hemodynamic parameters for last 24 hours:  Stable MAP, nsr  Intake/Output from previous day: 09/12 0701 - 09/13 0700 In: 1382.6 [P.O.:236; I.V.:39.8; IV Piggyback:1106.9] Out: 1150 [Urine:1150] Intake/Output this shift: No intake/output data recorded.  EXAM Sternal incision healing Drive line dressing clean, dry Normal VAD hum Lungs clear  Lab Results: Recent Labs    09/27/22 0226  WBC 9.7  HGB 9.5*  HCT 32.6*  PLT 307   BMET:  Recent Labs    09/27/22 0226 09/28/22 0808  NA 139 138  K 4.1 3.9  CL 102 105  CO2 25 25  GLUCOSE 113* 127*  BUN 12 8  CREATININE 0.70 0.62  CALCIUM 8.5* 8.7*    PT/INR:  Recent Labs    09/28/22 0808  LABPROT 19.8*  INR 1.7*   ABG    Component Value Date/Time   PHART 7.365 08/31/2022 1504   HCO3 33.2 (H) 09/12/2022 1108   HCO3 31.2 (H) 09/12/2022 1108   TCO2 35 (H) 09/12/2022 1108   TCO2 33 (H) 09/12/2022 1108   ACIDBASEDEF 4.0 (H) 08/31/2022 1504   O2SAT 66.9 09/16/2022 0539   CBG (last 3)  Recent Labs    09/26/22 2154 09/27/22 0637 09/27/22 1137  GLUCAP 120* 85 79    Assessment/Plan: S/P VAD implant for cardiomyopathy Appreciate CIR care, so does the patient.   LOS: 2 days    Lovett Sox 09/29/2022

## 2022-09-30 DIAGNOSIS — Z95811 Presence of heart assist device: Secondary | ICD-10-CM | POA: Diagnosis not present

## 2022-09-30 DIAGNOSIS — I959 Hypotension, unspecified: Secondary | ICD-10-CM | POA: Diagnosis not present

## 2022-09-30 DIAGNOSIS — G4700 Insomnia, unspecified: Secondary | ICD-10-CM

## 2022-09-30 DIAGNOSIS — I63511 Cerebral infarction due to unspecified occlusion or stenosis of right middle cerebral artery: Secondary | ICD-10-CM | POA: Diagnosis not present

## 2022-09-30 LAB — PROTIME-INR
INR: 1.8 — ABNORMAL HIGH (ref 0.8–1.2)
Prothrombin Time: 21.4 s — ABNORMAL HIGH (ref 11.4–15.2)

## 2022-09-30 MED ORDER — CHLORHEXIDINE GLUCONATE CLOTH 2 % EX PADS
6.0000 | MEDICATED_PAD | Freq: Two times a day (BID) | CUTANEOUS | Status: DC
  Administered 2022-09-30 – 2022-10-17 (×34): 6 via TOPICAL

## 2022-09-30 MED ORDER — CHLORHEXIDINE GLUCONATE CLOTH 2 % EX PADS: 6.0000 | MEDICATED_PAD | Freq: Every day | CUTANEOUS | Status: DC

## 2022-09-30 MED ORDER — WARFARIN SODIUM 3 MG PO TABS
3.0000 mg | ORAL_TABLET | Freq: Once | ORAL | Status: AC
  Administered 2022-09-30: 3 mg via ORAL
  Filled 2022-09-30: qty 1

## 2022-09-30 MED ORDER — SILDENAFIL CITRATE 20 MG PO TABS: 20.0000 mg | ORAL_TABLET | Freq: Two times a day (BID) | ORAL | Status: DC

## 2022-09-30 MED ORDER — MELATONIN 5 MG PO TABS
5.0000 mg | ORAL_TABLET | Freq: Every day | ORAL | Status: DC
  Administered 2022-09-30 – 2022-10-10 (×11): 5 mg via ORAL
  Filled 2022-09-30 (×11): qty 1

## 2022-09-30 MED ORDER — SODIUM CHLORIDE 0.9% FLUSH: 10.0000 mL | INTRAVENOUS | Status: DC | PRN

## 2022-09-30 MED ORDER — SILDENAFIL CITRATE 20 MG PO TABS
20.0000 mg | ORAL_TABLET | Freq: Three times a day (TID) | ORAL | Status: DC
  Administered 2022-09-30 – 2022-10-17 (×51): 20 mg via ORAL
  Filled 2022-09-30 (×53): qty 1

## 2022-09-30 NOTE — Plan of Care (Signed)
Problem: Consults Goal: RH STROKE PATIENT EDUCATION Description: See Patient Education module for education specifics  Outcome: Progressing   Problem: RH BOWEL ELIMINATION Goal: RH STG MANAGE BOWEL W/MEDICATION W/ASSISTANCE Description: STG Manage Bowel with Medication with min Assistance. Outcome: Progressing   Problem: RH BLADDER ELIMINATION Goal: RH STG MANAGE BLADDER WITH ASSISTANCE Description: STG Manage Bladder With min Assistance Outcome: Progressing   Problem: RH SKIN INTEGRITY Goal: RH STG SKIN FREE OF INFECTION/BREAKDOWN Description: Incisions and skin tears will continue to heal and be free of infection/breakdown with min assist  Outcome: Progressing Goal: RH STG MAINTAIN SKIN INTEGRITY WITH ASSISTANCE Description: STG Maintain Skin Integrity With min Assistance. Outcome: Progressing Goal: RH STG ABLE TO PERFORM INCISION/WOUND CARE W/ASSISTANCE Description: STG Able To Perform Incision/Wound Care With min Assistance. Outcome: Progressing   Problem: RH SAFETY Goal: RH STG ADHERE TO SAFETY PRECAUTIONS W/ASSISTANCE/DEVICE Description: STG Adhere to Safety Precautions With cueing Assistance/Device. Outcome: Progressing Goal: RH STG DECREASED RISK OF FALL WITH ASSISTANCE Description: STG Decreased Risk of Fall With cueing Assistance. Outcome: Progressing   Problem: RH COGNITION-NURSING Goal: RH STG ANTICIPATES NEEDS/CALLS FOR ASSIST W/ASSIST/CUES Description: STG Anticipates Needs/Calls for Assist With cueing Assistance/Cues. Outcome: Progressing   Problem: RH PAIN MANAGEMENT Goal: RH STG PAIN MANAGED AT OR BELOW PT'S PAIN GOAL Description: Less than 4 with PRN medications min assist  Outcome: Progressing   Problem: RH KNOWLEDGE DEFICIT Goal: RH STG INCREASE KNOWLEGDE OF HYPERLIPIDEMIA Description: Patient will be able to manage cholesterol medication and improve cholesterol levels from diet/lifestyle modifications from nursing education and nursing handouts  independently  Outcome: Progressing Goal: RH STG INCREASE KNOWLEDGE OF STROKE PROPHYLAXIS Description: Patient/caregiver will be able to manage stroke medications from nursing education and nursing handouts independently  Outcome: Progressing   Problem: Education: Goal: Understanding of CV disease, CV risk reduction, and recovery process will improve Outcome: Progressing Goal: Individualized Educational Video(s) Outcome: Progressing   Problem: Activity: Goal: Ability to return to baseline activity level will improve Outcome: Progressing   Problem: Cardiovascular: Goal: Ability to achieve and maintain adequate cardiovascular perfusion will improve Outcome: Progressing Goal: Vascular access site(s) Level 0-1 will be maintained Outcome: Progressing   Problem: Health Behavior/Discharge Planning: Goal: Ability to safely manage health-related needs after discharge will improve Outcome: Progressing   Problem: Education: Goal: Knowledge of disease or condition will improve Outcome: Progressing Goal: Knowledge of secondary prevention will improve (MUST DOCUMENT ALL) Outcome: Progressing Goal: Knowledge of patient specific risk factors will improve Loraine Leriche N/A or DELETE if not current risk factor) Outcome: Progressing   Problem: Ischemic Stroke/TIA Tissue Perfusion: Goal: Complications of ischemic stroke/TIA will be minimized Outcome: Progressing   Problem: Coping: Goal: Will verbalize positive feelings about self Outcome: Progressing Goal: Will identify appropriate support needs Outcome: Progressing   Problem: Health Behavior/Discharge Planning: Goal: Ability to manage health-related needs will improve Outcome: Progressing Goal: Goals will be collaboratively established with patient/family Outcome: Progressing   Problem: Self-Care: Goal: Ability to participate in self-care as condition permits will improve Outcome: Progressing Goal: Verbalization of feelings and concerns  over difficulty with self-care will improve Outcome: Progressing Goal: Ability to communicate needs accurately will improve Outcome: Progressing   Problem: Nutrition: Goal: Risk of aspiration will decrease Outcome: Progressing Goal: Dietary intake will improve Outcome: Progressing

## 2022-09-30 NOTE — Progress Notes (Signed)
Occupational Therapy Session Note  Patient Details  Name: Charles Holmes MRN: [id number] Date of Birth: Jun 15, 1965  Today's Date: 09/30/2022 OT Individual Time: 1345-1500 OT Individual Time Calculation (min): 75 min    Short Term Goals: Week 1:  OT Short Term Goal 1 (Week 1): Pt will complete toilet transfers min A using LRAD OT Short Term Goal 2 (Week 1): Pt will recall hemi-dressing techniques with min verbal cues OT Short Term Goal 3 (Week 1): Pt will maintain sternal precautions during BADLs and functional transfers at 95% safe with min verbal cues OT Short Term Goal 4 (Week 1): Pt will maintain dynamic standing balance using LRAD during functional task >1 minute to demonstrate increased activity tolerance  Skilled Therapeutic Interventions/Progress Updates:    Pt received in bed and agreeable to therapy. He initially requested to stay in bed as he felt fatigued but stated he needed to use the urinal.  Encouraged pt to get to EOB and stand to avoid spillage in bed. Sat to EOB with CGA following sternal precautions.  For sit to stands, he tries to push up with R hand so cued pt to keep hand on thigh or chest.  Stands up with min A and then held stand with min A while he held the urinal.  Noted his brief was slightly soiled along with foam dressing on bottom. Pt also felt he needed to sit on BSC.  Got BSC for pt and he completed stand pivot transfer min A. Sat on commode but no void. Pt assisted with cleansing and RN replaced foam dressing.   Despite his fatigue, he ended up doing several sit to stands and standing for a few minutes at a time.   Sitting EOB, he worked on hand over hand guiding of L hand for grasping of cones.  His caregiver Charles Holmes arrived.  When pt got fatigued and moved to supine she assisted me with scooting him up in bed.   For LUE,  A/AROM with reaching hand to ceiling and working on controlled drops of hand for eccentric tricep strength and extending hand back up for  concentric tricep strength.  Placing and holding of arm reaching to ceiling.  Showed Charles Holmes how to do these exercises with him if she wants to do some with him later this evening.  Retrograde massage to hand. Positioned hand on foam pillows to keep hand elevated.  Pt resting in bed with all needs met and alarm set.   Therapy Documentation Precautions:  Precautions Precautions: Sternal, Fall, Other (comment) Precaution Comments: LVAD Restrictions Weight Bearing Restrictions: No RUE Weight Bearing:  (sternal precautions) LUE Weight Bearing:  (sternal precautions) RLE Weight Bearing: Weight bearing as tolerated (no restrictions) Other Position/Activity Restrictions: Sternal Precautions   Vital Signs: Therapy Vitals Temp: 98 F (36.7 C) Pulse Rate: 96 Resp: 16 BP: 94/81 Patient Position (if appropriate): Lying Oxygen Therapy SpO2: 95 % O2 Device: Room Air Pain: Pain Assessment Pain Scale: 0-10 Pain Score: 0-No pain ADL: ADL Eating: Minimal assistance Where Assessed-Eating: Bed level Grooming: Minimal assistance, Moderate assistance Where Assessed-Grooming: Edge of bed Upper Body Bathing: Moderate assistance Where Assessed-Upper Body Bathing: Edge of bed Lower Body Bathing: Maximal assistance Where Assessed-Lower Body Bathing: Edge of bed Upper Body Dressing: Maximal assistance Where Assessed-Upper Body Dressing: Edge of bed Lower Body Dressing: Maximal assistance Where Assessed-Lower Body Dressing: Edge of bed Toileting: Maximal assistance Where Assessed-Toileting: Bed level Toilet Transfer: Moderate assistance Toilet Transfer Method: Stand pivot Toilet Transfer Equipment: Gaffer:  Not assessed Walk-In Shower Transfer: Not assessed    Therapy/Group: Individual Therapy  Charles Holmes 09/30/2022, 4:49 PM

## 2022-09-30 NOTE — Progress Notes (Signed)
ANTICOAGULATION CONSULT NOTE  Pharmacy Consult for warfarin Indication:  LVAD  Allergies  Allergen Reactions   Pacerone [Amiodarone] Other (See Comments)    Severe tremors   Percocet [Oxycodone-Acetaminophen] Itching    Patient Measurements: Height: 5\' 7"  (170.2 cm) Weight: 81 kg (178 lb 9.2 oz) IBW/kg (Calculated) : 66.1 Heparin Dosing Weight: 87kg  Vital Signs: Temp: 98.5 F (36.9 C) (09/14 0400) Temp Source: Oral (09/14 0400) BP: 97/85 (09/14 0914) Pulse Rate: 95 (09/14 0914)  Labs: Recent Labs    09/28/22 0808 09/29/22 1552 09/30/22 0935  LABPROT 19.8* 19.9* 21.4*  INR 1.7* 1.7* 1.8*  CREATININE 0.62  --   --     Estimated Creatinine Clearance: 101.4 mL/min (by C-G formula based on SCr of 0.62 mg/dL).   Medical History: Past Medical History:  Diagnosis Date   CAD (coronary artery disease)    CHF (congestive heart failure) (HCC)    COPD (chronic obstructive pulmonary disease) (HCC)    GERD (gastroesophageal reflux disease)    Hyperlipidemia    Hypertension    LVAD (left ventricular assist device) present (HCC)    Stroke (HCC)    Systolic heart failure (HCC) 2021   LVEF 18%, RVEF 38% on cardiac MRI 12/19/2019. possible cardiac sarcoidosis.   Wide-complex tachycardia 2021   wears LifeVest     Assessment: 57yoM on apixaban PTA for hx AF admitted for LVAD workup. Pt s/p HM3 implant on 8/13 c/b acute CVA postop. Started low dose heparin + warfarin started 8/19. Low-dose heparin was stopped 8/31.  INR dropped to subtherapeutic range at 1.7 9/12 and 9/13. CBC remains stable. No s/sx of bleeding. Oral intake is improving from 50% to 100% of scheduled meals consumed yesterday. Dose of 3mg  given yesterday. INR now 1.8 today  Goal of Therapy:  INR 2-2.5 Monitor platelets by anticoagulation protocol: Yes   Plan:  Warfarin 3 mg tonight If INR remains at current level or decreases, would recommend increasing warfarin dose tomorrow. Monitor daily INR,  CBC  Thank you for allowing pharmacy to participate in this patient's care,  Kareem Aul A. Jeanella Craze, PharmD, BCPS, FNKF Clinical Pharmacist [place] Please utilize Amion for appropriate phone number to reach the unit pharmacist [facility])

## 2022-09-30 NOTE — Progress Notes (Signed)
Peripherally Inserted Central Catheter Placement  The IV Nurse has discussed with the patient and/or persons authorized to consent for the patient, the purpose of this procedure and the potential benefits and risks involved with this procedure.  The benefits include less needle sticks, lab draws from the catheter, and the patient may be discharged home with the catheter. Risks include, but not limited to, infection, bleeding, blood clot (thrombus formation), and puncture of an artery; nerve damage and irregular heartbeat and possibility to perform a PICC exchange if needed/ordered by physician.  Alternatives to this procedure were also discussed.  Bard Power PICC patient education guide, fact sheet on infection prevention and patient information card has been provided to patient /or left at bedside.    PICC Placement Documentation  PICC Double Lumen 09/30/22 Right Cephalic 38 cm 0 cm (Active)  Indication for Insertion or Continuance of Line Home intravenous therapies (PICC only) 09/30/22 0847  Exposed Catheter (cm) 0 cm 09/30/22 0847  Site Assessment Clean, Dry, Intact 09/30/22 0847  Lumen #1 Status Flushed;Saline locked;Blood return noted 09/30/22 0847  Lumen #2 Status Flushed;Saline locked;Blood return noted 09/30/22 0847  Dressing Type Transparent;Securing device 09/30/22 0847  Dressing Status Antimicrobial disc in place;Clean, Dry, Intact 09/30/22 0847  Line Care Connections checked and tightened 09/30/22 0847  Line Adjustment (NICU/IV Team Only) No 09/30/22 0847  Dressing Intervention New dressing 09/30/22 0847  Dressing Change Due 10/07/22 09/30/22 0847       Elliot Dally 09/30/2022, 8:47 AM

## 2022-09-30 NOTE — Progress Notes (Signed)
PROGRESS NOTE   Subjective/Complaints:  Pt doing alright today, slept so-so, agreeable to melatonin increase. Pain doing ok. LBM today just now. Using condom cath, no issues there. Denies any other complaints or concerns today.     ROS:   Pt denies SOB, abd pain, CP, N/V/C/D, and vision changes  Objective:   No results found. No results for input(s): "WBC", "HGB", "HCT", "PLT" in the last 72 hours.  Recent Labs    09/28/22 0808  NA 138  K 3.9  CL 105  CO2 25  GLUCOSE 127*  BUN 8  CREATININE 0.62  CALCIUM 8.7*    Intake/Output Summary (Last 24 hours) at 09/30/2022 1152 Last data filed at 09/29/2022 1825 Gross per 24 hour  Intake 594 ml  Output 200 ml  Net 394 ml        Physical Exam: Vital Signs Blood pressure 97/85, pulse 95, temperature 98.5 F (36.9 C), temperature source Oral, resp. rate 14, height 5\' 7"  (1.702 m), weight 81 kg, SpO2 95%.    General: awake, alert, appropriate,  laying in bed getting changed- flat affect; NAD HENT: conjugate gaze; oropharynx dry CV: LVAD hum-strong Pulmonary: CTA B/L; no W/R/R, no increased WOB GI: soft, NT, ND, (+)BS- normoactive Psychiatric: appropriate- a little depressed Neurological: Ox3 Extremities; LUE somewhat swollen- likely from stroke- elevated on pillow-  Skin: chest incisions clean, LVAD drive site clean/dressed  PRIOR EXAMS: Neuro:  Alert and oriented x 3. Fair insight and awareness. Intact Memory. Normal language and speech. Cranial nerve exam unremarkable except for mild left central 7. MMT: LUE grossly 3- to 3/5 prox to distal. LLE 3 to 3+. Pt with decreased LT/pain sense on left. No abnl resting tone. DTR's tr to1+.   Musculoskeletal: Full ROM, No pain with AROM or PROM in the neck, trunk, or extremities. Posture appropriate     Assessment/Plan: 1. Functional deficits which require 3+ hours per day of interdisciplinary therapy in a comprehensive  inpatient rehab setting. Physiatrist is providing close team supervision and 24 hour management of active medical problems listed below. Physiatrist and rehab team continue to assess barriers to discharge/monitor patient progress toward functional and medical goals  Care Tool:  Bathing    Body parts bathed by patient: Chest, Left arm, Abdomen, Front perineal area, Face   Body parts bathed by helper: Buttocks, Right upper leg, Left upper leg, Right lower leg, Left lower leg, Right arm     Bathing assist Assist Level: Maximal Assistance - Patient 24 - 49%     Upper Body Dressing/Undressing Upper body dressing   What is the patient wearing?: Pull over shirt    Upper body assist Assist Level: Maximal Assistance - Patient 25 - 49%    Lower Body Dressing/Undressing Lower body dressing      What is the patient wearing?: Pants, Underwear/pull up     Lower body assist Assist for lower body dressing: Maximal Assistance - Patient 25 - 49%     Toileting Toileting    Toileting assist Assist for toileting: Maximal Assistance - Patient 25 - 49%     Transfers Chair/bed transfer  Transfers assist  Chair/bed transfer activity did not occur: Safety/medical  concerns (fatigue)  Chair/bed transfer assist level: Moderate Assistance - Patient 50 - 74%     Locomotion Ambulation   Ambulation assist   Ambulation activity did not occur: Safety/medical concerns (fatigue)          Walk 10 feet activity   Assist  Walk 10 feet activity did not occur: Safety/medical concerns (fatigue)        Walk 50 feet activity   Assist Walk 50 feet with 2 turns activity did not occur: Safety/medical concerns         Walk 150 feet activity   Assist Walk 150 feet activity did not occur: Safety/medical concerns         Walk 10 feet on uneven surface  activity   Assist Walk 10 feet on uneven surfaces activity did not occur: Safety/medical concerns (fatigue)          Wheelchair     Assist Is the patient using a wheelchair?: Yes   Wheelchair activity did not occur: Safety/medical concerns (fatigue)         Wheelchair 50 feet with 2 turns activity    Assist    Wheelchair 50 feet with 2 turns activity did not occur: Safety/medical concerns       Wheelchair 150 feet activity     Assist  Wheelchair 150 feet activity did not occur: Safety/medical concerns       Blood pressure 97/85, pulse 95, temperature 98.5 F (36.9 C), temperature source Oral, resp. rate 14, height 5\' 7"  (1.702 m), weight 81 kg, SpO2 95%.  Medical Problem List and Plan: 1. Functional deficits secondary to acute right MCA infarct/ LVAD pt             -patient may shower             -ELOS/Goals: modI/S 10-14 days   -Con't CIR PT, OT and SLP 2.  Antithrombotics: -DVT/anticoagulation:  Pharmaceutical: Coumadin  -9/14- managed by pharmacy- INR 1.8 today             -antiplatelet therapy: Aspirin 81 mg daily   3. Pain Management: Tylenol and Tramadol as needed   4. Mood/Behavior/Sleep: delirium/insomnia - team to provide ongoing ego support             -continue mirtazapine 7.5 mg q HS--not ordered? Defer to weekday team             -continue sertraline 50 mg daily             -continue melatonin 3 mg q HS -Started Trazodone 50mg  at bedtime on 09/29/22             -antipsychotic agents: n/a -9/13- mood somewhat better than when saw pt Tuesday- con't regimen and monitor -09/30/22 not sleeping well, increased Melatonin to 5mg  QHS; if not helping, may consider restarting mirtazapine as above 5. Neuropsych/cognition: This patient is capable of making decisions on his own behalf.   6. Skin/Wound Care: Routine skin care checks   7. Fluids/Electrolytes/Nutrition: strict Is and Os and follow-up chemistries             -poor PO intake on Marinol             -continue supplements   -9/12 labs reviewed. low albumin still continue interventions above  8:  Hypertension: monitor TID and prn   9/13- BP soft, but denies orthostatic hypotension -09/30/22 BPs generally stable/improving; was previously on midodrine, but per HF team pt was stable off it; monitor need for  restarting Vitals:   09/28/22 1255 09/28/22 2006 09/29/22 0000 09/29/22 0400  BP: (!) 88/75 105/87 106/80 97/80   09/29/22 0800 09/29/22 1354 09/29/22 1600 09/29/22 2000  BP: 106/84 97/86 100/84 91/64   09/29/22 2016 09/30/22 0000 09/30/22 0400 09/30/22 0914  BP: 91/64 (!) 115/91 104/87 97/85     9: Hyperlipidemia: continue atorvastatin 80mg  daily   10: Enterococcus bacteremia: 6 weeks IV antibiotic, ceftriaxone and ampicillin             -follow-up with ID  -09/30/22 PICC placed this AM  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: Home meds: Coreg 3.125, Lasix 40mg , Lostartan 25mg , Aldactone 12.5, Farxiga 10mg , mexilitene --holding all currently   12: Acute on chronic systolic heart failure with cardiogenic shock s/p LVAD             -daily weights balanced so far             -continue digoxin 0.125 mg daily             -continue mexiletine 250 mg BID -continue midodrine 5 mg q 8 hours--not ordered, per HF team on 9/13 pt was stable off Midodrine; hold for now             -continue sildenafil 20 mg q 8 hours             -continue warfarin per pharmacy   -9/13- Wgt 80.0 kg today- stable last few days  -09/30/22 wt stable, off midodrine as above; monitor closely Filed Weights   09/28/22 0500 09/29/22 0452 09/30/22 0430  Weight: 79.3 kg 80 kg 81 kg    13: Hx of VT: continue mexiletine, digoxin; has ICD   14: DM-2: A1c = 5.5%; monitor PO intake; DC SSI (Farxiga at home)   15: OSA: needs outpatient sleep study   16: Anemia, iron deficiency: follow-up CBC--most recent hgb 9.5--lab pending still today?  -09/30/22 no CBC done since 9/11, has labs ordered for [facility] 10/02/22   17: Severe COPD:              -continue Ellipta one puff daily   18: S/p tooth extractions this admission: monitor PO  tolerance   19: Glaucoma: continue Cosopt and Xalatan   I spent >32mins performing patient care related activities, including face to face time, documentation time, med management/review of meds and vitals, discussion of meds with patient and nursing staff, and overall coordination of care.   LOS: 3 days A FACE TO FACE EVALUATION WAS PERFORMED  [id number] 09/30/2022, 11:52 AM

## 2022-09-30 NOTE — Progress Notes (Signed)
Physical Therapy Session Note  Patient Details  Name: Charles Holmes MRN: [id number] Date of Birth: 02-07-1965  Today's Date: 09/30/2022 PT Individual Time: 1120-1204 PT Individual Time Calculation (min): 44 min   Short Term Goals: Week 1:  PT Short Term Goal 1 (Week 1): Pt will require min A consisntely for bed mobility PT Short Term Goal 2 (Week 1): Pt will require mod A with sit to stand with LRAD PT Short Term Goal 3 (Week 1): Pt will require max A x 1 for gait x 10 ft with LRAD  Skilled Therapeutic Interventions/Progress Updates:    Pt received sitting in w/c awake and agreeable to therapy session. Pt hooked up to PICC line and inability to be disconnected for session so required +2 assist throughout session for line management during mobility. Reinforced education with pt on LVAD back-up bag to have with him at all times and the contents it needs. Reinforced education on how to move LVAD from wall power to battery power - pt requires total A for this at this time due to L UE plegia. Pt does recall how to perform self check of LVAD using monitor. Requires increased time to manage lines at beginning and end of session to ensure pt safety.   Transported to/from gym in w/c for time management and energy conservation.  Sit>stand from w/c to therapist supporting LUE due to plegia with light min A.   Gait training 170ft + ~123ft using L UE support from therapist with min assist for balance and +2 for line management and w/c follow in event of fatigue. Pt demonstrating the following gait deviations with therapist providing the described cuing and facilitation for improvement:  - noticed pt with L lateral LOB when turning or when becoming fatigued due to L LE weakness - decreased gait speed - decreased B LE step lengths but reciprocal pattern - impaired endurance  Transported back to room. R stand pivot w/c>EOB with therapist supporting L UE and min A for balance. Sit>supine, HOB partially  elevated and using bedrail, with CGA for safety and therapist ensuring safe placement of lines. Pt left supine in bed with needs in reach, lines intact on wall power, and bed alarm on.   Therapy Documentation Precautions:  Precautions Precautions: Sternal, Fall, Other (comment) Precaution Comments: LVAD Restrictions Weight Bearing Restrictions: Yes RUE Weight Bearing:  (sternal precautions) LUE Weight Bearing:  (sternal precautions) RLE Weight Bearing: Weight bearing as tolerated (no restrictions) Other Position/Activity Restrictions: Sternal Precautions   Pain:  No reports of pain throughout session.    Therapy/Group: Individual Therapy  Ginny Forth , PT, DPT, NCS, CSRS 09/30/2022, 8:05 AM

## 2022-09-30 NOTE — Progress Notes (Signed)
Patient ID: Charles Holmes, male   DOB: [birth year], 57 y.o.   MRN: [id number]     Advanced Heart Failure Rounding Note  PCP-Cardiologist: Norman Herrlich, MD  [facility]: Dr. Gala Romney   Subjective:    Admitted to CIR on 09/11  Working with PT, walked 200 feet without walker.   INR 1.8 today.  No BMET.  MAP 90s.  LVAD Interrogation HM III: Speed: 5400  Flow: 4.7  PI: 3.4 Power: 4  VAD interrogated personally. Parameters stable.  Objective:   Weight Range: 81 kg Body mass index is 27.97 kg/m.   Vital Signs:   Temp:  [97.9 F (36.6 C)-98.5 F (36.9 C)] 98.5 F (36.9 C) (09/14 0400) Pulse Rate:  [81-95] 95 (09/14 0914) Resp:  [14-18] 14 (09/14 0400) BP: (91-115)/(64-91) 97/85 (09/14 0914) SpO2:  [95 %-96 %] 95 % (09/14 0400) Weight:  [81 kg] 81 kg (09/14 0430) Last BM Date : 09/29/22  Weight change: Filed Weights   09/28/22 0500 09/29/22 0452 09/30/22 0430  Weight: 79.3 kg 80 kg 81 kg   Intake/Output:   Intake/Output Summary (Last 24 hours) at 09/30/2022 1310 Last data filed at 09/29/2022 1825 Gross per 24 hour  Intake 476 ml  Output 200 ml  Net 276 ml   Physical Exam   General: Well appearing this am. NAD.  HEENT: Normal. Neck: Supple, JVP 7-8 cm. Carotids OK.  Cardiac:  Mechanical heart sounds with LVAD hum present.  Lungs:  CTAB, normal effort.  Abdomen:  NT, ND, no HSM. No bruits or masses. +BS  LVAD exit site: Well-healed and incorporated. Dressing dry and intact. No erythema or drainage. Stabilization device present and accurately applied. Driveline dressing changed daily per sterile technique. Extremities:  Warm and dry. No cyanosis, clubbing, rash, or edema.  Neuro:  Alert & oriented x 3. Cranial nerves grossly intact. Moves all 4 extremities w/o difficulty. Affect pleasant    Labs    CBC No results for input(s): "WBC", "NEUTROABS", "HGB", "HCT", "MCV", "PLT" in the last 72 hours.   Basic Metabolic Panel Recent Labs    52/84/13 0808  NA 138  K 3.9   CL 105  CO2 25  GLUCOSE 127*  BUN 8  CREATININE 0.62  CALCIUM 8.7*  MG 1.8    BNP: BNP (last 3 results) Recent Labs    09/11/22 2252 09/18/22 2246 09/25/22 2351  BNP 933.0* 809.0* 851.4*   Imaging   No results found.  Medications:     Scheduled Medications:  aspirin EC  81 mg Oral Daily   atorvastatin  80 mg Oral Daily   Chlorhexidine Gluconate Cloth  6 each Topical Daily   digoxin  0.125 mg Oral Daily   dorzolamide-timolol  1 drop Both Eyes BID   feeding supplement  237 mL Oral TID BM   Gerhardt's butt cream   Topical TID   influenza vac split trivalent PF  0.5 mL Intramuscular Tomorrow-1000   latanoprost  1 drop Both Eyes QHS   melatonin  5 mg Oral QHS   mexiletine  250 mg Oral BID   mometasone-formoterol  2 puff Inhalation BID   multivitamin with minerals  1 tablet Oral Daily   pantoprazole  40 mg Oral Daily   sertraline  50 mg Oral Daily   sildenafil  20 mg Oral TID   traZODone  50 mg Oral QHS   umeclidinium bromide  1 puff Inhalation Daily   warfarin  3 mg Oral ONCE-1600   Warfarin - Pharmacist  Dosing Inpatient   Does not apply q1600    Infusions:  sodium chloride 250 mL (09/30/22 1013)   ampicillin (OMNIPEN) IV 2 g (09/30/22 1117)   cefTRIAXone (ROCEPHIN)  IV 2 g (09/30/22 1016)    PRN Medications: sodium chloride, acetaminophen, albuterol, alum & mag hydroxide-simeth, bisacodyl, guaiFENesin-dextromethorphan, ondansetron **OR** ondansetron (ZOFRAN) IV, mouth rinse, polyethylene glycol, sodium chloride, sodium chloride flush, sodium chloride flush, sodium phosphate, traMADol   Patient Profile  Charles Holmes is a 57 y.o. 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 Admitted with cardiogenic shock, stabilized and underwent HM3 LVAD. Post implant course c/b acute CVA and enterococcus faecalis bacteremia.   Assessment/Plan  1.  Acute on chronic Systolic HF-->Cardiogenic Shock  - Diagnosed  11/2019. Presented with VT. LHC 70% LAD  - cMRI 12/21 concerning for sarcoid and EF 18%.  - PET 2/22 at [facility] EF 25% + active sarcoid - Echo 08/26/20 EF < 20% severely dilated LV RV mildly decreased.  - [employer] CRT-D upgrade in 06/08/21 - Echo 07/10/22: EF <20%, RV okay, mod pericardial effusion, mod Charles/TR - Admitted 07/25 with cardiogenic shock. - RHC: Nonobstructive CAD, severely elevated filling pressures and low Fick CO/CI (2.7/1.4) - 08/13 HM III LVAD implant + clipping LAA d/t severe thickening and invagination of mitral valve annulus impeding flows.  - Apical core sent - no mention of sarcoid - Speed increased to 5300 on 08/14.  - Echo 8/26 mod-sev RV dysfunction. Speed increased to 5400 8/26 - RHC 8/27: low filing pressures with excellent cardiac output on EPI, PAPi 4.6 - Ramp Echo 9/4, speed increased to 5400  - MAPs 90s. Stable off midodrine. - Volume stable. Lasix PRN. - Continue digoxin 0.125 mg daily - BMET tomorrow.   2. HM-3 LVAD - VAD interrogated personally. Parameters stable. - LDH stable - Continue ASA 81 - INR 1.8. Continue warfarin, adjust dose.   3.  Acute stroke - Hx CVA 06/24 -Admitted 06/24 w/ R MCA stroke. S/p TPA and mechanical clot extraction. No residual deficits. Likely cardioembolic in setting of severe LV dysfunction. - Developed left sided weakness 08/14. CTA with R MCA infarct. Taken to IR for thrombectomy - Repeat CT head with small to moderate size hemorrhagic conversion.  - repeat head CT on 8/17 w/ improved hemorrhagic CVA - Back on warfarin. INR 1.7. - neurology signed off  - Continue PT/OT - Continue ASA 81 mg daily - Appreciate CIR.  4. Enterococcus faecalis bacteremia - Bcx 2/2 on 9/6 - ID consult 9/6 -> ampicillin and ceftriaxone - TEE recommended but doubt it will change management as VAD cannot be removed and will require IV abx followed by long-term suppression - Echo 09/10 - no obvious vegetations - PICC out - Repeat BCx 9/8  NGTD. - ID recommending 6 weeks IV amp/ceftriaxone followed by long-term suppression with amoxicillin. Will need to place PICC line if discharges home before he completes course of IV abx.  5. Hx VT - ln setting of potential sarcoid heart disease  - Off amio due to tremor. Continue mexiletine  - now s/p ICD.   6. CAD - LHC 12/07/19 70-% LAD, no intervention - LHC 8/24 non obstructive CAD.  - Continue statin. On aspirin for VAD.  7. 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  - holding methotrexate w/ recent surgery and active infection, can discuss timing of restarting the medication  at outpatient follow-up - apical core pathology not diagnostic of cardiac sarcoidosis.   8. Paroxsymal AT/AF - Rhythm regular - On Warfarin   9. AKI - suspect cardiorenal, improved w/ inotropic support - Creatinine stable now   10. Iron deficiency anemia/ Post-op anemia - Has had IV Fe.    11. Pulmonary  - PFTs with severe obstructive defect, response to bronchodilator. FEV1 1.04L, FEV1/FVC 48% - Stable  12. Psych - Depression and delirium post VAD implant. Prolonged hospital course.  - Improving - Continue sertraline  Marca Ancona 09/30/2022

## 2022-09-30 NOTE — Progress Notes (Signed)
Occupational Therapy Session Note  Patient Details  Name: Charles Holmes MRN: [id number] Date of Birth: Jan 15, 1966  Today's Date: 09/30/2022 OT Individual Time: 3474-259 OT Individual Calculation:  28 minutes (missed 32 minutes due to nursing care and time to complete BM)   Short Term Goals: Week 1:  OT Short Term Goal 1 (Week 1): Pt will complete toilet transfers min A using LRAD OT Short Term Goal 2 (Week 1): Pt will recall hemi-dressing techniques with min verbal cues OT Short Term Goal 3 (Week 1): Pt will maintain sternal precautions during BADLs and functional transfers at 95% safe with min verbal cues OT Short Term Goal 4 (Week 1): Pt will maintain dynamic standing balance using LRAD during functional task >1 minute to demonstrate increased activity tolerance  Skilled Therapeutic Interventions/Progress Updates:    patient approach for therapy and concurred to participate. soon afterward start of session, he participated in received vital nursing care.   During start of continuing OT, patient stated he preferred to use bed pan to [facility].  He was allowed to complete that function as he requested.  Upon 2nd approach for OT therapy patient was seated in w/c beside bed, concurred initially to continue OT therapy, and soon after completing grooming and seated task completion using L UE as gross assist, he stated he did not want to change gown, and was too fatigued to continue and that he wanted to eat breakfast before the next therapy session (PT) was to start in the next 30 minutes.  Patient required 1 verbal cue to not flex shoulder past 90degrees while combing his hair.   He self corrected afterwards after the instruction.  He was left seated in his w/c with the respiratory therapist who came in to complete care.  Continue patient POC.     Therapy Documentation Precautions:  Precautions Precautions: Sternal, Fall, Other (comment) Precaution Comments: LVAD Restrictions Weight Bearing  Restrictions: Yes RUE Weight Bearing:  (sternal precautions) LUE Weight Bearing:  (sternal precautions) RLE Weight Bearing: Weight bearing as tolerated (no restrictions) Other Position/Activity Restrictions: Sternal Precautions  Pain: denied     Therapy/Group: Individual Therapy  Bud Face [facility] 09/30/2022, 3:44 PM

## 2022-10-01 DIAGNOSIS — G4700 Insomnia, unspecified: Secondary | ICD-10-CM | POA: Diagnosis not present

## 2022-10-01 DIAGNOSIS — I959 Hypotension, unspecified: Secondary | ICD-10-CM | POA: Diagnosis not present

## 2022-10-01 DIAGNOSIS — Z95811 Presence of heart assist device: Secondary | ICD-10-CM | POA: Diagnosis not present

## 2022-10-01 DIAGNOSIS — I63511 Cerebral infarction due to unspecified occlusion or stenosis of right middle cerebral artery: Secondary | ICD-10-CM | POA: Diagnosis not present

## 2022-10-01 LAB — BASIC METABOLIC PANEL
Anion gap: 7 (ref 5–15)
Anion gap: 7 (ref 5–15)
BUN: 7 mg/dL (ref 6–20)
BUN: 7 mg/dL (ref 6–20)
CO2: 28 mmol/L (ref 22–32)
CO2: 28 mmol/L (ref 22–32)
Calcium: 8.3 mg/dL — ABNORMAL LOW (ref 8.9–10.3)
Calcium: 8.3 mg/dL — ABNORMAL LOW (ref 8.9–10.3)
Chloride: 104 mmol/L (ref 98–111)
Chloride: 105 mmol/L (ref 98–111)
Creatinine, Ser: 0.67 mg/dL (ref 0.61–1.24)
Creatinine, Ser: 0.71 mg/dL (ref 0.61–1.24)
GFR, Estimated: >60 mL/min (ref >60)
GFR, Estimated: >60 mL/min (ref >60)
Glucose, Bld: 100 mg/dL — ABNORMAL HIGH (ref 70–99)
Glucose, Bld: 100 mg/dL — ABNORMAL HIGH (ref 70–99)
Potassium: 3.7 mmol/L (ref 3.5–5.1)
Potassium: 3.7 mmol/L (ref 3.5–5.1)
Sodium: 139 mmol/L (ref 135–145)
Sodium: 140 mmol/L (ref 135–145)

## 2022-10-01 LAB — PROTIME-INR
INR: 2 — ABNORMAL HIGH (ref 0.8–1.2)
Prothrombin Time: 23.1 s — ABNORMAL HIGH (ref 11.4–15.2)

## 2022-10-01 LAB — CBC WITH DIFFERENTIAL/PLATELET
Abs Immature Granulocytes: 0.03 10*3/uL (ref 0.00–0.07)
Basophils Absolute: 0.1 10*3/uL (ref 0.0–0.1)
Basophils Relative: 1 %
Eosinophils Absolute: 0.1 10*3/uL (ref 0.0–0.5)
Eosinophils Relative: 2 %
HCT: 31.2 % — ABNORMAL LOW (ref 39.0–52.0)
Hemoglobin: 8.9 g/dL — ABNORMAL LOW (ref 13.0–17.0)
Immature Granulocytes: 0 %
Lymphocytes Relative: 17 %
Lymphs Abs: 1.4 10*3/uL (ref 0.7–4.0)
MCH: 27.3 pg (ref 26.0–34.0)
MCHC: 28.5 g/dL — ABNORMAL LOW (ref 30.0–36.0)
MCV: 95.7 fL (ref 80.0–100.0)
Monocytes Absolute: 1 10*3/uL (ref 0.1–1.0)
Monocytes Relative: 12 %
Neutro Abs: 5.6 10*3/uL (ref 1.7–7.7)
Neutrophils Relative %: 68 %
Platelets: 251 10*3/uL (ref 150–400)
RBC: 3.26 MIL/uL — ABNORMAL LOW (ref 4.22–5.81)
RDW: 18.1 % — ABNORMAL HIGH (ref 11.5–15.5)
WBC: 8.2 10*3/uL (ref 4.0–10.5)
nRBC: 0 % (ref 0.0–0.2)

## 2022-10-01 MED ORDER — WARFARIN SODIUM 3 MG PO TABS
3.0000 mg | ORAL_TABLET | Freq: Once | ORAL | Status: AC
  Administered 2022-10-01: 3 mg via ORAL
  Filled 2022-10-01: qty 1

## 2022-10-01 NOTE — Progress Notes (Signed)
ANTICOAGULATION CONSULT NOTE  Pharmacy Consult for warfarin Indication:  LVAD  Allergies  Allergen Reactions   Pacerone [Amiodarone] Other (See Comments)    Severe tremors   Percocet [Oxycodone-Acetaminophen] Itching    Patient Measurements: Height: 5\' 7"  (170.2 cm) Weight: 78.6 kg (173 lb 4.5 oz) IBW/kg (Calculated) : 66.1 Heparin Dosing Weight: 87kg  Vital Signs: Temp: 98.8 F (37.1 C) (09/15 0501) Temp Source: Oral (09/15 0501) BP: 96/65 (09/15 0501) Pulse Rate: 56 (09/15 0502)  Labs: Recent Labs    09/28/22 0808 09/29/22 1552 09/30/22 0935 10/01/22 0339  HGB  --   --   --  8.9*  HCT  --   --   --  31.2*  PLT  --   --   --  251  LABPROT 19.8* 19.9* 21.4* 23.1*  INR 1.7* 1.7* 1.8* 2.0*  CREATININE 0.62  --   --  0.67  0.71    Estimated Creatinine Clearance: 93 mL/min (by C-G formula based on SCr of 0.71 mg/dL).   Medical History: Past Medical History:  Diagnosis Date   CAD (coronary artery disease)    CHF (congestive heart failure) (HCC)    COPD (chronic obstructive pulmonary disease) (HCC)    GERD (gastroesophageal reflux disease)    Hyperlipidemia    Hypertension    LVAD (left ventricular assist device) present (HCC)    Stroke (HCC)    Systolic heart failure (HCC) 2021   LVEF 18%, RVEF 38% on cardiac MRI 12/19/2019. possible cardiac sarcoidosis.   Wide-complex tachycardia 2021   wears LifeVest     Assessment: 57yoM on apixaban PTA for hx AF admitted for LVAD workup. Pt s/p HM3 implant on 8/13 c/b acute CVA postop. Started low dose heparin + warfarin started 8/19. Low-dose heparin was stopped 8/31.  INR therapeutic on 9/15 at 2. CBC remains stable. No s/sx of bleeding. Oral intake is improving from 50% to 100% of scheduled meals consumed yesterday. Dose of 3mg  given yesterday.   Goal of Therapy:  INR 2-2.5 Monitor platelets by anticoagulation protocol: Yes   Plan:  Warfarin 3 mg tonight Monitor daily INR, CBC  Thank you for allowing  pharmacy to participate in this patient's care,  Jeanella Cara, PharmD, [facility] Clinical Pharmacist Please see AMION for all Pharmacists' Contact Phone Numbers 10/01/2022, 7:24 AM

## 2022-10-01 NOTE — Plan of Care (Signed)
  Problem: Consults Goal: RH STROKE PATIENT EDUCATION Description: See Patient Education module for education specifics  Outcome: Progressing   Problem: RH BOWEL ELIMINATION Goal: RH STG MANAGE BOWEL W/MEDICATION W/ASSISTANCE Description: STG Manage Bowel with Medication with min Assistance. Outcome: Progressing   Problem: RH BLADDER ELIMINATION Goal: RH STG MANAGE BLADDER WITH ASSISTANCE Description: STG Manage Bladder With min Assistance Outcome: Progressing   Problem: RH SKIN INTEGRITY Goal: RH STG SKIN FREE OF INFECTION/BREAKDOWN Description: Incisions and skin tears will continue to heal and be free of infection/breakdown with min assist  Outcome: Progressing Goal: RH STG MAINTAIN SKIN INTEGRITY WITH ASSISTANCE Description: STG Maintain Skin Integrity With min Assistance. Outcome: Progressing Goal: RH STG ABLE TO PERFORM INCISION/WOUND CARE W/ASSISTANCE Description: STG Able To Perform Incision/Wound Care With min Assistance. Outcome: Progressing   Problem: RH SAFETY Goal: RH STG ADHERE TO SAFETY PRECAUTIONS W/ASSISTANCE/DEVICE Description: STG Adhere to Safety Precautions With cueing Assistance/Device. Outcome: Progressing Goal: RH STG DECREASED RISK OF FALL WITH ASSISTANCE Description: STG Decreased Risk of Fall With cueing Assistance. Outcome: Progressing   Problem: RH COGNITION-NURSING Goal: RH STG ANTICIPATES NEEDS/CALLS FOR ASSIST W/ASSIST/CUES Description: STG Anticipates Needs/Calls for Assist With cueing Assistance/Cues. Outcome: Progressing   Problem: RH PAIN MANAGEMENT Goal: RH STG PAIN MANAGED AT OR BELOW PT'S PAIN GOAL Description: Less than 4 with PRN medications min assist  Outcome: Progressing   Problem: RH KNOWLEDGE DEFICIT Goal: RH STG INCREASE KNOWLEGDE OF HYPERLIPIDEMIA Description: Patient will be able to manage cholesterol medication and improve cholesterol levels from diet/lifestyle modifications from nursing education and nursing handouts  independently  Outcome: Progressing Goal: RH STG INCREASE KNOWLEDGE OF STROKE PROPHYLAXIS Description: Patient/caregiver will be able to manage stroke medications from nursing education and nursing handouts independently  Outcome: Progressing   Problem: Education: Goal: Understanding of CV disease, CV risk reduction, and recovery process will improve Outcome: Progressing Goal: Individualized Educational Video(s) Outcome: Progressing   Problem: Activity: Goal: Ability to return to baseline activity level will improve Outcome: Progressing   Problem: Cardiovascular: Goal: Ability to achieve and maintain adequate cardiovascular perfusion will improve Outcome: Progressing Goal: Vascular access site(s) Level 0-1 will be maintained Outcome: Progressing   Problem: Health Behavior/Discharge Planning: Goal: Ability to safely manage health-related needs after discharge will improve Outcome: Progressing   Problem: Education: Goal: Knowledge of disease or condition will improve Outcome: Progressing Goal: Knowledge of secondary prevention will improve (MUST DOCUMENT ALL) Outcome: Progressing Goal: Knowledge of patient specific risk factors will improve Loraine Leriche N/A or DELETE if not current risk factor) Outcome: Progressing   Problem: Ischemic Stroke/TIA Tissue Perfusion: Goal: Complications of ischemic stroke/TIA will be minimized Outcome: Progressing   Problem: Coping: Goal: Will verbalize positive feelings about self Outcome: Progressing Goal: Will identify appropriate support needs Outcome: Progressing   Problem: Health Behavior/Discharge Planning: Goal: Ability to manage health-related needs will improve Outcome: Progressing Goal: Goals will be collaboratively established with patient/family Outcome: Progressing   Problem: Self-Care: Goal: Ability to participate in self-care as condition permits will improve Outcome: Progressing Goal: Verbalization of feelings and concerns  over difficulty with self-care will improve Outcome: Progressing Goal: Ability to communicate needs accurately will improve Outcome: Progressing   Problem: Nutrition: Goal: Risk of aspiration will decrease Outcome: Progressing Goal: Dietary intake will improve Outcome: Progressing

## 2022-10-01 NOTE — Progress Notes (Signed)
PROGRESS NOTE   Subjective/Complaints:  Pt doing alright today, slept off and on but ok overall. Pain controlled. LBM yesterday morning. Using condom cath, no issues there. Denies any other complaints or concerns today.     ROS:   Pt denies SOB, abd pain, CP, N/V/C/D, and vision changes  Objective:   No results found. Recent Labs    10/01/22 0339  WBC 8.2  HGB 8.9*  HCT 31.2*  PLT 251    Recent Labs    10/01/22 0339  NA 140  139  K 3.7  3.7  CL 105  104  CO2 28  28  GLUCOSE 100*  100*  BUN 7  7  CREATININE 0.67  0.71  CALCIUM 8.3*  8.3*    Intake/Output Summary (Last 24 hours) at 10/01/2022 1226 Last data filed at 10/01/2022 1220 Gross per 24 hour  Intake 2902.69 ml  Output 1225 ml  Net 1677.69 ml        Physical Exam: Vital Signs Blood pressure 96/78, pulse 100, temperature 98.8 F (37.1 C), temperature source Oral, resp. rate 17, height 5\' 7"  (1.702 m), weight 78.6 kg, SpO2 94%.    General: awake, alert, appropriate,  laying in bed; NAD HENT: conjugate gaze; oropharynx dry CV: LVAD hum-strong, trace BLE edema but bed angled down at knees/feet Pulmonary: CTA B/L; no W/R/R, no increased WOB GI: soft, NT, ND, (+)BS- normoactive Psychiatric: appropriate- a little depressed, flat Neurological: Ox3 Extremities; LUE somewhat swollen- likely from stroke- elevated on pillow Skin: chest incisions clean, LVAD drive site clean/dressed  PRIOR EXAMS: Neuro:  Alert and oriented x 3. Fair insight and awareness. Intact Memory. Normal language and speech. Cranial nerve exam unremarkable except for mild left central 7. MMT: LUE grossly 3- to 3/5 prox to distal. LLE 3 to 3+. Pt with decreased LT/pain sense on left. No abnl resting tone. DTR's tr to1+.   Musculoskeletal: Full ROM, No pain with AROM or PROM in the neck, trunk, or extremities. Posture appropriate     Assessment/Plan: 1. Functional  deficits which require 3+ hours per day of interdisciplinary therapy in a comprehensive inpatient rehab setting. Physiatrist is providing close team supervision and 24 hour management of active medical problems listed below. Physiatrist and rehab team continue to assess barriers to discharge/monitor patient progress toward functional and medical goals  Care Tool:  Bathing    Body parts bathed by patient: Chest, Left arm, Abdomen, Front perineal area, Face   Body parts bathed by helper: Buttocks, Right upper leg, Left upper leg, Right lower leg, Left lower leg, Right arm     Bathing assist Assist Level: Maximal Assistance - Patient 24 - 49%     Upper Body Dressing/Undressing Upper body dressing   What is the patient wearing?: Pull over shirt    Upper body assist Assist Level: Maximal Assistance - Patient 25 - 49%    Lower Body Dressing/Undressing Lower body dressing      What is the patient wearing?: Pants, Underwear/pull up     Lower body assist Assist for lower body dressing: Maximal Assistance - Patient 25 - 49%     Toileting Toileting    Toileting assist  Assist for toileting: Maximal Assistance - Patient 25 - 49%     Transfers Chair/bed transfer  Transfers assist  Chair/bed transfer activity did not occur: Safety/medical concerns (fatigue)  Chair/bed transfer assist level: Moderate Assistance - Patient 50 - 74%     Locomotion Ambulation   Ambulation assist   Ambulation activity did not occur: Safety/medical concerns (fatigue)          Walk 10 feet activity   Assist  Walk 10 feet activity did not occur: Safety/medical concerns (fatigue)        Walk 50 feet activity   Assist Walk 50 feet with 2 turns activity did not occur: Safety/medical concerns         Walk 150 feet activity   Assist Walk 150 feet activity did not occur: Safety/medical concerns         Walk 10 feet on uneven surface  activity   Assist Walk 10 feet on uneven  surfaces activity did not occur: Safety/medical concerns (fatigue)         Wheelchair     Assist Is the patient using a wheelchair?: Yes   Wheelchair activity did not occur: Safety/medical concerns (fatigue)         Wheelchair 50 feet with 2 turns activity    Assist    Wheelchair 50 feet with 2 turns activity did not occur: Safety/medical concerns       Wheelchair 150 feet activity     Assist  Wheelchair 150 feet activity did not occur: Safety/medical concerns       Blood pressure 96/78, pulse 100, temperature 98.8 F (37.1 C), temperature source Oral, resp. rate 17, height 5\' 7"  (1.702 m), weight 78.6 kg, SpO2 94%.  Medical Problem List and Plan: 1. Functional deficits secondary to acute right MCA infarct/ LVAD pt             -patient may shower             -ELOS/Goals: modI/S 10-14 days   -Con't CIR PT, OT and SLP 2.  Antithrombotics: -DVT/anticoagulation:  Pharmaceutical: Coumadin  -9/14- managed by pharmacy- INR 1.8 today  -10/01/22 INR 2.0 today, appreciate pharm support             -antiplatelet therapy: Aspirin 81 mg daily   3. Pain Management: Tylenol and Tramadol as needed   4. Mood/Behavior/Sleep: delirium/insomnia - team to provide ongoing ego support             -continue mirtazapine 7.5 mg q HS--not ordered? Defer to weekday team             -continue sertraline 50 mg daily             -continue melatonin 3 mg q HS -Started Trazodone 50mg  at bedtime on 09/29/22             -antipsychotic agents: n/a -9/13- mood somewhat better than when saw pt Tuesday- con't regimen and monitor -09/30/22 not sleeping well, increased Melatonin to 5mg  QHS; if not helping, may consider restarting mirtazapine as above -10/01/22 slept a bit better, weekday team to assess need for mirtazapine tomorrow 5. Neuropsych/cognition: This patient is capable of making decisions on his own behalf.   6. Skin/Wound Care: Routine skin care checks   7.  Fluids/Electrolytes/Nutrition: strict Is and Os and follow-up chemistries             -poor PO intake on Marinol             -  continue supplements   -9/12 labs reviewed. low albumin still continue interventions above  8: Hypertension: monitor TID and prn   9/13- BP soft, but denies orthostatic hypotension -9/14-15/24 BPs generally stable/improving; was previously on midodrine, but per HF team pt was stable off it; monitor need for restarting Vitals:   09/29/22 2016 09/30/22 0000 09/30/22 0400 09/30/22 0914  BP: 91/64 (!) 115/91 104/87 97/85   09/30/22 1320 09/30/22 1654 09/30/22 1700 09/30/22 1938  BP: 94/81 (!) 82/70 (!) 82/70 96/71   10/01/22 0134 10/01/22 0501 10/01/22 0814 10/01/22 1218  BP: 94/65 96/65 112/82 96/78     9: Hyperlipidemia: continue atorvastatin 80mg  daily   10: Enterococcus bacteremia: 6 weeks IV antibiotic, ceftriaxone and ampicillin             -follow-up with ID  -09/30/22 PICC placed this AM  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: Home meds: Coreg 3.125, Lasix 40mg , Lostartan 25mg , Aldactone 12.5, Farxiga 10mg , mexilitene --holding all currently   12: Acute on chronic systolic heart failure with cardiogenic shock s/p LVAD             -daily weights balanced so far             -continue digoxin 0.125 mg daily             -continue mexiletine 250 mg BID -continue midodrine 5 mg q 8 hours--not ordered, per HF team on 9/13 pt was stable off Midodrine; hold for now             -continue sildenafil 20 mg q 8 hours             -continue warfarin per pharmacy   -9/13- Wgt 80.0 kg today- stable last few days  -09/30/22 wt stable, off midodrine as above; monitor closely -10/01/22 wt down a bit but overall stable, trace edema noted but pt's legs dependent in bed today; monitor closely, may need lasix (per HF team notes, states "lasix PRN" but none ordered) Filed Weights   09/29/22 0452 09/30/22 0430 10/01/22 0500  Weight: 80 kg 81 kg 78.6 kg    13: Hx of VT: continue mexiletine,  digoxin; has ICD   14: DM-2: A1c = 5.5%; monitor PO intake; DC SSI (Farxiga at home)   15: OSA: needs outpatient sleep study   16: Anemia, iron deficiency: follow-up CBC--most recent hgb 9.5--lab pending still today?  -09/30/22 no CBC done since 9/11, has labs ordered for Mon 10/02/22  -10/01/22 Hgb 8.9 today, fairly stable; monitor   17: Severe COPD:              -continue Ellipta one puff daily   18: S/p tooth extractions this admission: monitor PO tolerance   19: Glaucoma: continue Cosopt and Xalatan    LOS: 4 days A FACE TO FACE EVALUATION WAS PERFORMED  [address] 10/01/2022, 12:26 PM

## 2022-10-01 NOTE — Progress Notes (Signed)
Patient ID: Charles Holmes, male   DOB: 1965/12/23, 57 y.o.   MRN: [id number]     Advanced Heart Failure Rounding Note  PCP-Cardiologist: Norman Herrlich, MD  [facility]: Dr. Gala Romney   Subjective:    Admitted to CIR on 09/11  Working with PT, walked 200 feet without walker yesterday.  Rest day today.    INR 2 today.  MAP 70s-90s.  LVAD Interrogation HM III: Speed: 5400  Flow: 4.6  PI: 3.3 Power: 3.9  VAD interrogated personally. Parameters stable.  Objective:   Weight Range: 78.6 kg Body mass index is 27.14 kg/m.   Vital Signs:   Temp:  [98 F (36.7 C)-98.8 F (37.1 C)] 98.8 F (37.1 C) (09/15 0501) Pulse Rate:  [56-100] 100 (09/15 0814) Resp:  [16-18] 17 (09/15 0501) BP: (82-112)/(65-82) 112/82 (09/15 0814) SpO2:  [93 %-98 %] 94 % (09/15 0502) Weight:  [78.6 kg] 78.6 kg (09/15 0500) Last BM Date : 09/30/22  Weight change: Filed Weights   09/29/22 0452 09/30/22 0430 10/01/22 0500  Weight: 80 kg 81 kg 78.6 kg   Intake/Output:   Intake/Output Summary (Last 24 hours) at 10/01/2022 1214 Last data filed at 10/01/2022 1116 Gross per 24 hour  Intake 2802.69 ml  Output 1125 ml  Net 1677.69 ml   Physical Exam   General: Well appearing this am. NAD.  HEENT: Normal. Neck: Supple, JVP 7-8 cm. Carotids OK.  Cardiac:  Mechanical heart sounds with LVAD hum present.  Lungs:  CTAB, normal effort.  Abdomen:  NT, ND, no HSM. No bruits or masses. +BS  LVAD exit site: Well-healed and incorporated. Dressing dry and intact. No erythema or drainage. Stabilization device present and accurately applied. Driveline dressing changed daily per sterile technique. Extremities:  Warm and dry. No cyanosis, clubbing, rash, or edema.  Neuro:  Alert & oriented x 3. Cranial nerves grossly intact. Moves all 4 extremities w/o difficulty. Affect pleasant     Labs    CBC Recent Labs    10/01/22 0339  WBC 8.2  NEUTROABS 5.6  HGB 8.9*  HCT 31.2*  MCV 95.7  PLT 251     Basic Metabolic  Panel Recent Labs    10/01/22 0339  NA 140  139  K 3.7  3.7  CL 105  104  CO2 28  28  GLUCOSE 100*  100*  BUN 7  7  CREATININE 0.67  0.71  CALCIUM 8.3*  8.3*    BNP: BNP (last 3 results) Recent Labs    09/11/22 2252 09/18/22 2246 09/25/22 2351  BNP 933.0* 809.0* 851.4*   Imaging   No results found.  Medications:     Scheduled Medications:  aspirin EC  81 mg Oral Daily   atorvastatin  80 mg Oral Daily   Chlorhexidine Gluconate Cloth  6 each Topical BID   digoxin  0.125 mg Oral Daily   dorzolamide-timolol  1 drop Both Eyes BID   feeding supplement  237 mL Oral TID BM   Gerhardt's butt cream   Topical TID   influenza vac split trivalent PF  0.5 mL Intramuscular Tomorrow-1000   latanoprost  1 drop Both Eyes QHS   melatonin  5 mg Oral QHS   mexiletine  250 mg Oral BID   mometasone-formoterol  2 puff Inhalation BID   multivitamin with minerals  1 tablet Oral Daily   pantoprazole  40 mg Oral Daily   sertraline  50 mg Oral Daily   sildenafil  20 mg Oral TID  traZODone  50 mg Oral QHS   umeclidinium bromide  1 puff Inhalation Daily   warfarin  3 mg Oral ONCE-1600   Warfarin - Pharmacist Dosing Inpatient   Does not apply q1600    Infusions:  sodium chloride 250 mL (09/30/22 1013)   ampicillin (OMNIPEN) IV 2 g (10/01/22 1037)   cefTRIAXone (ROCEPHIN)  IV Stopped (10/01/22 1000)    PRN Medications: sodium chloride, acetaminophen, albuterol, alum & mag hydroxide-simeth, bisacodyl, guaiFENesin-dextromethorphan, ondansetron **OR** ondansetron (ZOFRAN) IV, mouth rinse, polyethylene glycol, sodium chloride, sodium chloride flush, sodium chloride flush, sodium phosphate, traMADol   Patient Profile  Charles Holmes is a 57 y.o. 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 Admitted with cardiogenic shock, stabilized and underwent HM3 LVAD. Post implant course c/b acute CVA and enterococcus faecalis  bacteremia.   Assessment/Plan  1.  Acute on chronic Systolic HF-->Cardiogenic Shock  - Diagnosed 11/2019. Presented with VT. LHC 70% LAD  - cMRI 12/21 concerning for sarcoid and EF 18%.  - PET 2/22 at [facility] EF 25% + active sarcoid - Echo 08/26/20 EF < 20% severely dilated LV RV mildly decreased.  - [employer] CRT-D upgrade in 06/08/21 - Echo 07/10/22: EF <20%, RV okay, mod pericardial effusion, mod Charles/TR - Admitted 07/25 with cardiogenic shock. - RHC: Nonobstructive CAD, severely elevated filling pressures and low Fick CO/CI (2.7/1.4) - 08/13 HM III LVAD implant + clipping LAA d/t severe thickening and invagination of mitral valve annulus impeding flows.  - Apical core sent - no mention of sarcoid - Speed increased to 5300 on 08/14.  - Echo 8/26 mod-sev RV dysfunction. Speed increased to 5400 8/26 - RHC 8/27: low filing pressures with excellent cardiac output on EPI, PAPi 4.6 - Ramp Echo 9/4, speed increased to 5400  - MAPs 70s-90s. Stable off midodrine. - Volume stable. Lasix PRN. - Continue digoxin 0.125 mg daily  2. HM-3 LVAD - VAD interrogated personally. Parameters stable. - LDH stable - Continue ASA 81 - INR 2. Continue warfarin.   3.  Acute stroke - Hx CVA 06/24 -Admitted 06/24 w/ R MCA stroke. S/p TPA and mechanical clot extraction. No residual deficits. Likely cardioembolic in setting of severe LV dysfunction. - Developed left sided weakness 08/14. CTA with R MCA infarct. Taken to IR for thrombectomy - Repeat CT head with small to moderate size hemorrhagic conversion.  - repeat head CT on 8/17 w/ improved hemorrhagic CVA - Back on warfarin. INR 1.7. - neurology signed off  - Continue PT/OT - Continue ASA 81 mg daily - Appreciate CIR.  4. Enterococcus faecalis bacteremia - Bcx 2/2 on 9/6 - ID consult 9/6 -> ampicillin and ceftriaxone - TEE recommended but doubt it will change management as VAD cannot be removed and will require IV abx followed by long-term suppression -  Echo 09/10 - no obvious vegetations - PICC out - Repeat BCx 9/8 NGTD. - ID recommending 6 weeks IV amp/ceftriaxone followed by long-term suppression with amoxicillin. Will need to place PICC line if discharges home before he completes course of IV abx.  5. Hx VT - ln setting of potential sarcoid heart disease  - Off amio due to tremor. Continue mexiletine  - now s/p ICD.   6. CAD - LHC 12/07/19 70-% LAD, no intervention - LHC 8/24 non obstructive CAD.  - Continue statin. On aspirin for VAD.  7. 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  - holding methotrexate w/ recent surgery and active infection, can discuss timing of restarting the medication at outpatient follow-up - apical core pathology not diagnostic of cardiac sarcoidosis.   8. Paroxsymal AT/AF - Rhythm regular - On Warfarin   9. AKI - suspect cardiorenal, improved w/ inotropic support - Creatinine stable now   10. Iron deficiency anemia/ Post-op anemia - Has had IV Fe.    11. Pulmonary  - PFTs with severe obstructive defect, response to bronchodilator. FEV1 1.04L, FEV1/FVC 48% - Stable  12. Psych - Depression and delirium post VAD implant. Prolonged hospital course.  - Improving - Continue sertraline  Marca Ancona 10/01/2022

## 2022-10-02 ENCOUNTER — Other Ambulatory Visit (HOSPITAL_COMMUNITY): Payer: Self-pay

## 2022-10-02 DIAGNOSIS — B372 Candidiasis of skin and nail: Secondary | ICD-10-CM

## 2022-10-02 DIAGNOSIS — I63511 Cerebral infarction due to unspecified occlusion or stenosis of right middle cerebral artery: Secondary | ICD-10-CM | POA: Diagnosis not present

## 2022-10-02 DIAGNOSIS — I428 Other cardiomyopathies: Secondary | ICD-10-CM

## 2022-10-02 DIAGNOSIS — D62 Acute posthemorrhagic anemia: Secondary | ICD-10-CM

## 2022-10-02 DIAGNOSIS — I5022 Chronic systolic (congestive) heart failure: Secondary | ICD-10-CM

## 2022-10-02 DIAGNOSIS — L22 Diaper dermatitis: Secondary | ICD-10-CM

## 2022-10-02 LAB — CBC WITH DIFFERENTIAL/PLATELET
Abs Immature Granulocytes: 0.03 10*3/uL (ref 0.00–0.07)
Basophils Absolute: 0.1 10*3/uL (ref 0.0–0.1)
Basophils Relative: 1 %
Eosinophils Absolute: 0.1 10*3/uL (ref 0.0–0.5)
Eosinophils Relative: 2 %
HCT: 30.7 % — ABNORMAL LOW (ref 39.0–52.0)
Hemoglobin: 8.7 g/dL — ABNORMAL LOW (ref 13.0–17.0)
Immature Granulocytes: 0 %
Lymphocytes Relative: 17 %
Lymphs Abs: 1.3 10*3/uL (ref 0.7–4.0)
MCH: 27.2 pg (ref 26.0–34.0)
MCHC: 28.3 g/dL — ABNORMAL LOW (ref 30.0–36.0)
MCV: 95.9 fL (ref 80.0–100.0)
Monocytes Absolute: 0.9 10*3/uL (ref 0.1–1.0)
Monocytes Relative: 12 %
Neutro Abs: 5.3 10*3/uL (ref 1.7–7.7)
Neutrophils Relative %: 68 %
Platelets: 235 10*3/uL (ref 150–400)
RBC: 3.2 MIL/uL — ABNORMAL LOW (ref 4.22–5.81)
RDW: 17.8 % — ABNORMAL HIGH (ref 11.5–15.5)
WBC: 7.8 10*3/uL (ref 4.0–10.5)
nRBC: 0 % (ref 0.0–0.2)

## 2022-10-02 LAB — PROTIME-INR
INR: 2 — ABNORMAL HIGH (ref 0.8–1.2)
Prothrombin Time: 22.6 s — ABNORMAL HIGH (ref 11.4–15.2)

## 2022-10-02 LAB — BASIC METABOLIC PANEL
Anion gap: 7 (ref 5–15)
BUN: 7 mg/dL (ref 6–20)
CO2: 27 mmol/L (ref 22–32)
Calcium: 8 mg/dL — ABNORMAL LOW (ref 8.9–10.3)
Chloride: 105 mmol/L (ref 98–111)
Creatinine, Ser: 0.8 mg/dL (ref 0.61–1.24)
GFR, Estimated: >60 mL/min (ref >60)
Glucose, Bld: 88 mg/dL (ref 70–99)
Potassium: 3.6 mmol/L (ref 3.5–5.1)
Sodium: 139 mmol/L (ref 135–145)

## 2022-10-02 MED ORDER — WARFARIN SODIUM 3 MG PO TABS
3.0000 mg | ORAL_TABLET | Freq: Once | ORAL | Status: AC
  Administered 2022-10-02: 3 mg via ORAL
  Filled 2022-10-02: qty 1

## 2022-10-02 NOTE — Progress Notes (Signed)
  Subjective: Patient walking in the hallway with physical therapy at a good pace without the rolling walker.  He appears very stable and stronger.  Patient denies shortness of breath or surgical pain. He is pleased with his rehab care on the CIR unit  Objective: Vital signs in last 24 hours: Temp:  [98 F (36.7 C)-98.4 F (36.9 C)] 98.4 F (36.9 C) (09/16 1407) Pulse Rate:  [71-95] 95 (09/16 1407) Resp:  [14-18] 17 (09/16 1407) BP: (84-106)/(17-87) 93/75 (09/16 1407) SpO2:  [93 %-95 %] 95 % (09/16 1407) Weight:  [79.7 kg] 79.7 kg (09/16 0500)  Hemodynamic parameters for last 24 hours:  Sinus rhythm stable blood pressure  Intake/Output from previous day: 09/15 0701 - 09/16 0700 In: 2922.7 [P.O.:320; I.V.:276.6; IV Piggyback:2326.1] Out: 1225 [Urine:1225] Intake/Output this shift: Total I/O In: 475 [P.O.:475] Out: 150 [Urine:150]  Exam Alert and comfortable Significant improvement in left-sided strength Lungs clear Incision healing Abdomen soft nontender Normal VAD hum  Lab Results: Recent Labs    10/01/22 0339 10/02/22 0236  WBC 8.2 7.8  HGB 8.9* 8.7*  HCT 31.2* 30.7*  PLT 251 235   BMET:  Recent Labs    10/01/22 0339 10/02/22 0236  NA 140  139 139  K 3.7  3.7 3.6  CL 105  104 105  CO2 28  28 27   GLUCOSE 100*  100* 88  BUN 7  7 7   CREATININE 0.67  0.71 0.80  CALCIUM 8.3*  8.3* 8.0*    PT/INR:  Recent Labs    10/02/22 0236  LABPROT 22.6*  INR 2.0*   ABG    Component Value Date/Time   PHART 7.365 08/31/2022 1504   HCO3 33.2 (H) 09/12/2022 1108   HCO3 31.2 (H) 09/12/2022 1108   TCO2 35 (H) 09/12/2022 1108   TCO2 33 (H) 09/12/2022 1108   ACIDBASEDEF 4.0 (H) 08/31/2022 1504   O2SAT 66.9 09/16/2022 0539   CBG (last 3)  No results for input(s): "GLUCAP" in the last 72 hours.  Assessment/Plan: S/P  Patient making great progress and appreciate CIR care.  PICC line in place for extended antibiotics for positive enterococcal  bacteremia probably from UTI.  Hold methotrexate for 3 months post op VAD   LOS: 5 days    Lovett Sox 10/02/2022

## 2022-10-02 NOTE — Progress Notes (Signed)
ANTICOAGULATION CONSULT NOTE - Follow-up  Pharmacy Consult for warfarin Indication:  LVAD  Allergies  Allergen Reactions   Pacerone [Amiodarone] Other (See Comments)    Severe tremors   Percocet [Oxycodone-Acetaminophen] Itching    Patient Measurements: Height: 5\' 7"  (170.2 cm) Weight: 79.7 kg (175 lb 11.3 oz) IBW/kg (Calculated) : 66.1 Heparin Dosing Weight: 87kg  Vital Signs: Temp: 98.1 F (36.7 C) (09/16 0435) Temp Source: Oral (09/16 0435) BP: 106/84 (09/16 0435) Pulse Rate: 81 (09/16 0435)  Labs: Recent Labs    09/30/22 0935 10/01/22 0339 10/02/22 0236  HGB  --  8.9* 8.7*  HCT  --  31.2* 30.7*  PLT  --  251 235  LABPROT 21.4* 23.1* 22.6*  INR 1.8* 2.0* 2.0*  CREATININE  --  0.67  0.71 0.80    Estimated Creatinine Clearance: 100.5 mL/min (by C-G formula based on SCr of 0.8 mg/dL).   Medical History: Past Medical History:  Diagnosis Date   CAD (coronary artery disease)    CHF (congestive heart failure) (HCC)    COPD (chronic obstructive pulmonary disease) (HCC)    GERD (gastroesophageal reflux disease)    Hyperlipidemia    Hypertension    LVAD (left ventricular assist device) present (HCC)    Stroke (HCC)    Systolic heart failure (HCC) 2021   LVEF 18%, RVEF 38% on cardiac MRI 12/19/2019. possible cardiac sarcoidosis.   Wide-complex tachycardia 2021   wears LifeVest     Assessment: 57yoM on apixaban PTA for hx AF admitted for LVAD workup. Pt s/p HM3 implant on 8/13 c/b acute CVA postop. Started low dose heparin + warfarin started 8/19. Low-dose heparin was stopped 8/31.  INR therapeutic on 9/15 at 2.0, CBC remains stable. No s/sx of bleeding. Oral intake is improving from 50% to 100% of scheduled meals consumed yesterday. Dose of 3 mg given yesterday. INR trending appropriately.  Goal of Therapy:  INR 2-2.5 Monitor platelets by anticoagulation protocol: Yes   Plan:  Warfarin 3 mg tonight Monitor daily INR, CBC  Thank you for allowing  pharmacy to participate in this patient's care,  Wilmer Floor, PharmD PGY2 Cardiology Pharmacy Resident Please see AMION for all Pharmacists' Contact Phone Numbers 10/02/2022, 7:40 AM

## 2022-10-02 NOTE — Progress Notes (Signed)
PROGRESS NOTE   Subjective/Complaints:  No issues overnite, nsg student notes groin rash   ROS:   Pt denies SOB, abd pain, CP, N/V/C/D, and vision changes  Objective:   No results found. Recent Labs    10/01/22 0339 10/02/22 0236  WBC 8.2 7.8  HGB 8.9* 8.7*  HCT 31.2* 30.7*  PLT 251 235    Recent Labs    10/01/22 0339 10/02/22 0236  NA 140  139 139  K 3.7  3.7 3.6  CL 105  104 105  CO2 28  28 27   GLUCOSE 100*  100* 88  BUN 7  7 7   CREATININE 0.67  0.71 0.80  CALCIUM 8.3*  8.3* 8.0*    Intake/Output Summary (Last 24 hours) at 10/02/2022 1021 Last data filed at 10/02/2022 0744 Gross per 24 hour  Intake 2922.69 ml  Output 1375 ml  Net 1547.69 ml        Physical Exam: Vital Signs Blood pressure (!) 89/75, pulse 81, temperature 98.1 F (36.7 C), temperature source Oral, resp. rate 18, height 5\' 7"  (1.702 m), weight 79.7 kg, SpO2 95%.   General: No acute distress Mood and affect are appropriate Heart: Regular rate and rhythm no rubs murmurs or extra sounds Lungs: Clear to auscultation, breathing unlabored, no rales or wheezes Abdomen: Positive bowel sounds, soft nontender to palpation, nondistended Extremities: No clubbing, cyanosis, or edema Skin: erythematous well demarcated inguinal fold macular rash    Skin: chest incisions clean healing well , LVAD drive site clean/dressed  PRIOR EXAMS: Neuro:  Alert and oriented x 3. Fair insight and awareness. Intact Memory. Normal language and speech. Cranial nerve exam unremarkable except for mild left central 7. MMT: LUE grossly 3- to 3/5 prox to distal. LLE 4-/5 throughout. Pt with intact  LT sense on left but feels a little different compared to R side . No abnl resting tone. DTR's tr to1+.   Musculoskeletal: Full ROM, No pain with AROM or PROM in the neck, trunk, or extremities. Posture appropriate     Assessment/Plan: 1. Functional deficits  which require 3+ hours per day of interdisciplinary therapy in a comprehensive inpatient rehab setting. Physiatrist is providing close team supervision and 24 hour management of active medical problems listed below. Physiatrist and rehab team continue to assess barriers to discharge/monitor patient progress toward functional and medical goals  Care Tool:  Bathing    Body parts bathed by patient: Chest, Left arm, Abdomen, Front perineal area, Face   Body parts bathed by helper: Buttocks, Right upper leg, Left upper leg, Right lower leg, Left lower leg, Right arm     Bathing assist Assist Level: Maximal Assistance - Patient 24 - 49%     Upper Body Dressing/Undressing Upper body dressing   What is the patient wearing?: Pull over shirt    Upper body assist Assist Level: Maximal Assistance - Patient 25 - 49%    Lower Body Dressing/Undressing Lower body dressing      What is the patient wearing?: Pants, Underwear/pull up     Lower body assist Assist for lower body dressing: Maximal Assistance - Patient 25 - 49%     Toileting Toileting  Toileting assist Assist for toileting: Maximal Assistance - Patient 25 - 49%     Transfers Chair/bed transfer  Transfers assist  Chair/bed transfer activity did not occur: Safety/medical concerns (fatigue)  Chair/bed transfer assist level: Moderate Assistance - Patient 50 - 74%     Locomotion Ambulation   Ambulation assist   Ambulation activity did not occur: Safety/medical concerns (fatigue)          Walk 10 feet activity   Assist  Walk 10 feet activity did not occur: Safety/medical concerns (fatigue)        Walk 50 feet activity   Assist Walk 50 feet with 2 turns activity did not occur: Safety/medical concerns         Walk 150 feet activity   Assist Walk 150 feet activity did not occur: Safety/medical concerns         Walk 10 feet on uneven surface  activity   Assist Walk 10 feet on uneven surfaces  activity did not occur: Safety/medical concerns (fatigue)         Wheelchair     Assist Is the patient using a wheelchair?: Yes Type of Wheelchair: Manual Wheelchair activity did not occur: Safety/medical concerns (fatigue)         Wheelchair 50 feet with 2 turns activity    Assist    Wheelchair 50 feet with 2 turns activity did not occur: Safety/medical concerns       Wheelchair 150 feet activity     Assist  Wheelchair 150 feet activity did not occur: Safety/medical concerns       Blood pressure (!) 89/75, pulse 81, temperature 98.1 F (36.7 C), temperature source Oral, resp. rate 18, height 5\' 7"  (1.702 m), weight 79.7 kg, SpO2 95%.  Medical Problem List and Plan: 1. Functional deficits secondary to acute right MCA infarct/ LVAD pt             -patient may shower             -ELOS/Goals: modI/S 10-14 days   -Con't CIR PT, OT and SLP 2.  Antithrombotics: -DVT/anticoagulation:  Pharmaceutical: Coumadin  -9/14- managed by pharmacy- INR 1.8 today  -10/01/22 INR 2.0 today, appreciate pharm support             -antiplatelet therapy: Aspirin 81 mg daily   3. Pain Management: Tylenol and Tramadol as needed   4. Mood/Behavior/Sleep: delirium/insomnia - team to provide ongoing ego support             -continue mirtazapine 7.5 mg q HS--not ordered? Defer to weekday team             -continue sertraline 50 mg daily             -continue melatonin 3 mg q HS -Started Trazodone 50mg  at bedtime on 09/29/22             -antipsychotic agents: n/a -9/13- mood somewhat better than when saw pt Tuesday- con't regimen and monitor -09/30/22 not sleeping well, increased Melatonin to 5mg  QHS; if not helping, may consider restarting mirtazapine as above -10/01/22 slept a bit better, weekday team to assess need for mirtazapine tomorrow 5. Neuropsych/cognition: This patient is capable of making decisions on his own behalf.   6. Skin/Wound Care: Routine skin care checks   7.  Fluids/Electrolytes/Nutrition: strict Is and Os and follow-up chemistries             -poor PO intake on Marinol             -  continue supplements   -9/12 labs reviewed. low albumin still continue interventions above  8: hypotension but difficult to assess due to LVAD    9/13- BP soft, but denies orthostatic hypotension -9/14-15/24 BPs generally stable/improving; was previously on midodrine, but per HF team pt was stable off it; monitor need for restarting Vitals:   09/30/22 1654 09/30/22 1700 09/30/22 1938 10/01/22 0134  BP: (!) 82/70 (!) 82/70 96/71 94/65    10/01/22 0501 10/01/22 0814 10/01/22 1218 10/01/22 1759  BP: 96/65 112/82 96/78 (!) 95/17   10/01/22 1953 10/02/22 0035 10/02/22 0435 10/02/22 0820  BP: (!) 84/72 100/87 106/84 (!) 89/75     9: Hyperlipidemia: continue atorvastatin 80mg  daily   10: Enterococcus bacteremia: 6 weeks IV antibiotic, ceftriaxone and ampicillin last dose 10/19 will go home with PICC and HH             -follow-up with ID  -09/30/22 PICC placed this AM  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: Home meds: Coreg 3.125, Lasix 40mg , Lostartan 25mg , Aldactone 12.5, Farxiga 10mg , mexilitene --holding all currently   12: Acute on chronic systolic heart failure with cardiogenic shock s/p LVAD             -daily weights balanced so far             -continue digoxin 0.125 mg daily             -continue mexiletine 250 mg BID -continue midodrine 5 mg q 8 hours--not ordered, per HF team on 9/13 pt was stable off Midodrine; hold for now             -continue sildenafil 20 mg q 8 hours             -continue warfarin per pharmacy   Managed by Heart Failure team - coordinating with primary service for activity tolerance monitoring  Filed Weights   09/30/22 0430 10/01/22 0500 10/02/22 0500  Weight: 81 kg 78.6 kg 79.7 kg    13: Hx of VT: continue mexiletine, digoxin; has ICD   14: DM-2: A1c = 5.5%; monitor PO intake; DC SSI (Farxiga at home)   15: OSA: needs outpatient sleep  study   16: Anemia, iron deficiency: follow-up CBC--most recent hgb 9.5--lab pending still today?  -09/30/22 no CBC done since 9/11, has labs ordered for Mon 10/02/22  -10/01/22 Hgb 8.9 today, fairly stable; monitor   17: Severe COPD:              -continue Ellipta one puff daily   18: S/p tooth extractions this admission: monitor PO tolerance   19: Glaucoma: continue Cosopt and Xalatan   20.  Candidiasis in groin start nystatin powder  LOS: 5 days A FACE TO FACE EVALUATION WAS PERFORMED  Erick Colace 10/02/2022, 10:21 AM

## 2022-10-02 NOTE — Progress Notes (Addendum)
Patient ID: Charles Holmes, male   DOB: 1965-07-25, 57 y.o.   MRN: [id number]     Advanced Heart Failure Rounding Note  PCP-Cardiologist: Charles Herrlich, MD  [facility]: Dr. Gala Holmes   Subjective:    Admitted to CIR on 09/11  Working with PT, walked 120 feet today.     INR 2 today.    Denies SOB.   LVAD Interrogation HM III: Speed: 5450  Flow: 4.5  PI: 3.6 Power: 4  VAD interrogated personally. Parameters stable.  Objective:   Weight Range: 79.7 kg Body mass index is 27.52 kg/m.   Vital Signs:   Temp:  [98.1 F (36.7 C)-98.3 F (36.8 C)] 98.1 F (36.7 C) (09/16 0435) Pulse Rate:  [81-95] 81 (09/16 0435) Resp:  [18] 18 (09/16 0435) BP: (84-106)/(17-87) 89/75 (09/16 0820) SpO2:  [95 %] 95 % (09/16 0435) Weight:  [79.7 kg] 79.7 kg (09/16 0500) Last BM Date : 10/01/22  Weight change: Filed Weights   09/30/22 0430 10/01/22 0500 10/02/22 0500  Weight: 81 kg 78.6 kg 79.7 kg   Intake/Output:   Intake/Output Summary (Last 24 hours) at 10/02/2022 1109 Last data filed at 10/02/2022 1029 Gross per 24 hour  Intake 3042.69 ml  Output 1375 ml  Net 1667.69 ml   Physical Exam   Physical Exam: GENERAL: No acute distress. In bed.  HEENT: normal  NECK: Supple, JVP flat .  2+ bilaterally, no bruits.  No lymphadenopathy or thyromegaly appreciated.   CARDIAC:  Mechanical heart sounds with LVAD hum present.  LUNGS:  Clear to auscultation bilaterally.  ABDOMEN:  Soft, round, nontender, positive bowel sounds x4.     LVAD exit site:  Dressing dry and intact.  No erythema or drainage.  Stabilization device present and accurately applied.  Driveline dressing is being changed daily per sterile technique. EXTREMITIES:  Warm and dry, no cyanosis, clubbing, rash or edema . RUE PICC NEUROLOGIC:  Alert and oriented x 3.    No aphasia.  No dysarthria.  Affect pleasant.     Labs    CBC Recent Labs    10/01/22 0339 10/02/22 0236  WBC 8.2 7.8  NEUTROABS 5.6 5.3  HGB 8.9* 8.7*  HCT 31.2*  30.7*  MCV 95.7 95.9  PLT 251 235     Basic Metabolic Panel Recent Labs    95/62/13 0339 10/02/22 0236  NA 140  139 139  K 3.7  3.7 3.6  CL 105  104 105  CO2 28  28 27   GLUCOSE 100*  100* 88  BUN 7  7 7   CREATININE 0.67  0.71 0.80  CALCIUM 8.3*  8.3* 8.0*    BNP: BNP (last 3 results) Recent Labs    09/11/22 2252 09/18/22 2246 09/25/22 2351  BNP 933.0* 809.0* 851.4*   Imaging   No results found.  Medications:     Scheduled Medications:  aspirin EC  81 mg Oral Daily   atorvastatin  80 mg Oral Daily   Chlorhexidine Gluconate Cloth  6 each Topical BID   digoxin  0.125 mg Oral Daily   dorzolamide-timolol  1 drop Both Eyes BID   feeding supplement  237 mL Oral TID BM   Gerhardt's butt cream   Topical TID   influenza vac split trivalent PF  0.5 mL Intramuscular Tomorrow-1000   latanoprost  1 drop Both Eyes QHS   melatonin  5 mg Oral QHS   mexiletine  250 mg Oral BID   mometasone-formoterol  2 puff Inhalation BID  multivitamin with minerals  1 tablet Oral Daily   pantoprazole  40 mg Oral Daily   sertraline  50 mg Oral Daily   sildenafil  20 mg Oral TID   traZODone  50 mg Oral QHS   umeclidinium bromide  1 puff Inhalation Daily   warfarin  3 mg Oral ONCE-1600   Warfarin - Pharmacist Dosing Inpatient   Does not apply q1600    Infusions:  sodium chloride 250 mL (10/01/22 1934)   ampicillin (OMNIPEN) IV 2 g (10/02/22 0940)   cefTRIAXone (ROCEPHIN)  IV 2 g (10/02/22 0838)    PRN Medications: sodium chloride, acetaminophen, albuterol, alum & mag hydroxide-simeth, bisacodyl, guaiFENesin-dextromethorphan, ondansetron **OR** ondansetron (ZOFRAN) IV, mouth rinse, polyethylene glycol, sodium chloride, sodium chloride flush, sodium chloride flush, sodium phosphate, traMADol   Patient Profile  Charles Holmes is a 57 y.o. 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 Admitted with cardiogenic shock,  stabilized and underwent HM3 LVAD. Post implant course c/b acute CVA and enterococcus faecalis bacteremia.   Assessment/Plan  1.  Acute on chronic Systolic HF-->Cardiogenic Shock  - Diagnosed 11/2019. Presented with VT. LHC 70% LAD  - cMRI 12/21 concerning for sarcoid and EF 18%.  - PET 2/22 at [facility] EF 25% + active sarcoid - Echo 08/26/20 EF < 20% severely dilated LV RV mildly decreased.  - Medtronic CRT-D upgrade in 06/08/21 - Echo 07/10/22: EF <20%, RV okay, mod pericardial effusion, mod Charles/TR - Admitted 07/25 with cardiogenic shock. - RHC: Nonobstructive CAD, severely elevated filling pressures and low Fick CO/CI (2.7/1.4) - 08/13 HM III LVAD implant + clipping LAA d/t severe thickening and invagination of mitral valve annulus impeding flows.  - Apical core sent - no mention of sarcoid - Speed increased to 5300 on 08/14.  - Echo 8/26 mod-sev RV dysfunction. Speed increased to 5400 8/26 - RHC 8/27: low filing pressures with excellent cardiac output on EPI, PAPi 4.6 - Ramp Echo 9/4, speed increased to 5400  - MAPs stable.  - On Sildenafil 20 mg tid.  - Volume status looks good. Can use lasix as needed.  - Continue digoxin 0.125 mg daily  2. HM-3 LVAD - VAD interrogated personally. Parameters stable. - Check LHD in am.  - Continue ASA 81 - INR 2. Continue warfarin.   3.  Acute stroke - Hx CVA 06/24 -Admitted 06/24 w/ R MCA stroke. S/p TPA and mechanical clot extraction. No residual deficits. Likely cardioembolic in setting of severe LV dysfunction. - Developed left sided weakness 08/14. CTA with R MCA infarct. Taken to IR for thrombectomy - Repeat CT head with small to moderate size hemorrhagic conversion.  - repeat head CT on 8/17 w/ improved hemorrhagic CVA - Back on warfarin. INR 2 - Continue ASA 81 mg daily - Appreciate CIR.  4. Enterococcus faecalis bacteremia - Bcx 2/2 on 9/6 - ID consult 9/6 -> ampicillin and ceftriaxone - TEE recommended but doubt it will change  management as VAD cannot be removed and will require IV abx followed by long-term suppression - Echo 09/10 - no obvious vegetations -  Repeat BCx 9/8 NGTD. - ID recommending 6 weeks IV amp/ceftriaxone followed by long-term suppression with amoxicillin. Stop date 11/04/22  PICC replaced 9/14.  5. Hx VT - ln setting of potential sarcoid heart disease  - Off amio due to tremor. Continue mexiletine  - now s/p ICD.   6. CAD - LHC 12/07/19 70-% LAD, no  intervention - LHC 8/24 non obstructive CAD.  - Continue statin. On aspirin for VAD.  7. 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  - holding methotrexate w/ recent surgery and active infection, can discuss timing of restarting the medication at outpatient follow-up - apical core pathology not diagnostic of cardiac sarcoidosis.   8. 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 AKI - suspect cardiorenal, improved w/ inotropic support - Resolved.    10. Iron deficiency anemia/ Post-op anemia - Has had IV Fe.    11. Pulmonary  - PFTs with severe obstructive defect, response to bronchodilator. FEV1 1.04L, FEV1/FVC 48% - Stable  12. Psych - Depression and delirium post VAD implant. Prolonged hospital course.  - Improving - Continue sertraline  Amy Clegg NP-C  10/02/2022  Agree with the above NP note.    He is working with PT, walking more.  INR therapeutic at 2.  No complaints.  LVAD parameters stable.   Marca Ancona 10/02/2022

## 2022-10-02 NOTE — Progress Notes (Signed)
Occupational Therapy Session Note  Patient Details  Name: Charles Holmes MRN: [id number] Date of Birth: 1965-07-12  Today's Date: 10/02/2022 OT Individual Time: 4098-1191 OT Individual Time Calculation (min): 57 min    Short Term Goals: Week 1:  OT Short Term Goal 1 (Week 1): Pt will complete toilet transfers min A using LRAD OT Short Term Goal 2 (Week 1): Pt will recall hemi-dressing techniques with min verbal cues OT Short Term Goal 3 (Week 1): Pt will maintain sternal precautions during BADLs and functional transfers at 95% safe with min verbal cues OT Short Term Goal 4 (Week 1): Pt will maintain dynamic standing balance using LRAD during functional task >1 minute to demonstrate increased activity tolerance  Skilled Therapeutic Interventions/Progress Updates:  Pt greeted supine in bed.Pt reports fatigue from previous session, but agreeable to OT intervention. Pt able to state sternal precautions but needed cues to incorporate functionally into session. Pt completed supine>sit with MIN a needing assist to elevate trunk into sitting.   Donned pants from EOB with overall MINA. Pt able to stand from EOB with no AD and MIN HHA. Assist needed to pull pants to waist line on L side. Worked on switching LVAD from wall to batteries, pt needed MAX step by step cues to sequence task. Assist needed to manipulate batteries d/t LUE hemiparesis. Noted lines tangled up in pic line needing + time to detangle. Once pt untangled, pt then reports fatigue needing to lay back down.   Rest break provided ~ 5 min with pt then agreeable to continue. MIN A to return to EOB. Pt completed stand pivot to w/c with HHA MINA. Pt completed seated grooming tasks at sink with set- up assist. Pt completed functional ambulation back to bed with MIN HHA. Pt wanted to stand to use urinal vs sitting on toilet. Pt needed MAX A for 3/3 toileting tasks with continent urine void. Pt then reports need to void bowels but declined  BSC/toilet wanting to void on bed pan. Pt able to bridge to place bed pan with total A.   Ended session with pt on bed pan, NT aware. Bed alarm activated and call bell within reach.   Therapy Documentation Precautions:  Precautions Precautions: Sternal, Fall, Other (comment) Precaution Comments: LVAD Restrictions Weight Bearing Restrictions: No RUE Weight Bearing:  (sternal precautions) LUE Weight Bearing:  (sternal precautions) RLE Weight Bearing: Weight bearing as tolerated (no restrictions) Other Position/Activity Restrictions: Sternal Precautions  Pain: no pain     Therapy/Group: Individual Therapy  Pollyann [facility] 10/02/2022, 12:15 PM

## 2022-10-02 NOTE — Progress Notes (Addendum)
LVAD Coordinator Rounding Note:  Admitted 08/10/22 due to acute on chronic CHF with cardiogenic shock. Milrinone dependent. Advance therapy workup completed, and pt deemed acceptable VAD candidate. Dental extractions completed 8/6. IABP placed 08/25/22.  HM 3 LVAD implanted on 08/29/22 by Dr Donata Clay under destination therapy criteria. 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 Result negative.  7/25 Admit with cardiogenic shock. Started milrinone and NE. 8/6 S/P 13 teeth extractions  8/9 IABP placed 8/13 S/p HM III LVAD implant + clipping left atrial appendage d/t severe thickening and invagination of mitral valve annulus impeding flows  CT Head 8/14 (initial) 1. 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 ASPECTS is 7. 2. No acute hemorrhage.  CT Angio Head/Neck 1. 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 2. Core infarct of 12 cc (somewhat underestimated compared to aspects) with 90 cc of penumbra. 3. Mild atherosclerosis.  Pt taken emergently to IR for percutaneous right common carotid arteriogram with thrombectomy. Revascularization achieved. Angio-seal closure device applied to left groin- clean, dry, and intact.   CT Head 8/15 @ 0450 Unchanged extent of infarct and hemorrhage in the right MCA distribution including small volume intraventricular clot. No hydrocephalus.  CT Head 8/17 @ 0701 Interval evolution of the right MCA territory infarct with decreased intraparenchymal and intraventricular hemorrhage. No hydrocephalus or midline shift.   Pt lying in bed this morning in good spirits. Plan to start therapy at 0915.   Blood cultures from 9/6 positive for Enterococcus Faecalis. Subsequent blood cultures NGTD. ID following recommending 6 weeks of IV Ampicillin and Ceftriaxone followed by chronic suppression with Amoxicillin.   VAD coordinator/nurses working with pt on changing power sources. He is unable to do this due  to his left sided weakness. Modification of his power cords was made in hopes he can grip the cords better. Has practice power cords for use by pt. See below for education documentation. He is making progress with therapy.  Pt may not shower at this time.   Vital signs: Temp: 98.1 HR: 81 Doppler Pressure: 78-done personally Automatic BP: 89/75 (82) O2 Sat: 95% on RA Wt: 183.6>191.1>190.9>190.2>184.8>...>163.1>162.5>171.3>172.4>176.6> 180.6>175.1>174.8>176.3>175.7bs   LVAD interrogation reveals:  Speed: 5400 Flow: 4.6 Power: 3.9 w PI: 3.2  Alarms: none Events: none  Hematocrit: 30 Fixed speed: 5400 Low speed limit: 5100  Drive Line: Existing VAD dressing removed and site care performed using sterile technique. Drive line exit site cleaned with Chlora prep applicators x 2, rinsed with saline, allowed to dry, and Vashe moistened 2x2 placed around driveline then covered with dry 4x4. Exit site healing and unincorporated, the velour is fully implanted at exit site. Scant amount of serous drainage.Slight redness, no tenderness, foul odor or rash noted. Drive line anchor re-applied. Pt denies fever or chills. Continue twice a week dressing changes on Monday/Thursday. Next dressing change due 10/05/22 by VAD coordinator or nurse champion only.        Labs:  LDH trend: 596>441>165>384>345>312>312>350>327>309>316>305>298>277>273>298>280>285>251>252  INR trend: 1.4>1.6>1.1>1.1>1.3>1.2>1>1.3>1.4>1.5>1.6>2.8>2.8>2.3>2.2>2.3>2.6>2.6>1.7>2  AST/ALT trend: 218/45>96/30>73/23>63/36>58/37>69/50>93/72>123/103>143/150>117/138>119/138>92/127>87/123>50/71>46/65>46/43  Total Bili trend: 4.4>4.6>5.1>1.8>1.9>1.2>1.0>1.5>1.1>0.8>0.9>1.0>0.8  WBC trend: 11.6>13.7>19.6>16.1>23>26.6>21>24.6>18.1>16.5>13.2>13.1>12.5>11.6>10.5>10.8>13.1>9.5>9.1>9.7>7.8  Anticoagulation Plan: -INR Goal: 2.0 - 2.5 -ASA Dose: 81 mg  - Coumadin dosing per pharmacy  Blood Products:  IntraOp 8/14: - 4 FFP - 2  Platelets - 2 PRBC - 1 cyro - 449 cc of cellsaver - DDAVP 20 mcg x 1   Device: Medtronic BiV -Therapies: ON VF ON >207 BPM FVT ON 176-207 BPM VT ON 171-207 BPM  Arrythmias:  Respiratory:   Infection:  09/01/22>> sputum cx>> NGTD>>final 09/01/22>> blood cx>> NGTD>>final 09/01/22>> urine cx>> NGTD>> final 09/13/22>>blood cxs>>no growth 5 days; final 09/22/22>>blood cxs>> Enterococcus faecalis  09/23/22>> blood cxs>> neg final 09/24/22>> blood cxs>> neg final  Renal:  9/12: BUN/CRT: 8/0.62  Adverse Events on VAD: - 08/30/22: - Developed left sided weakness this am. CTA with R MCA infarct. Taken to IR for thrombectomy   Drips:    Patient Education: Discharge teaching completed with pt's [name] on 9/5. See separate note for documentation.   Plan/Recommendations:  Please page VAD coordinator for any alarms or VAD equipment issues. Continue twice a week dressing changes on Monday/Thursday by VAD coordinator, Nurse Alla Feeling or [place].  Carlton Adam RN,BSN VAD Coordinator  Office: [phone]  24/7 Pager: [phone]

## 2022-10-02 NOTE — Progress Notes (Signed)
Occupational Therapy Session Note  Patient Details  Name: Charles Holmes MRN: [id number] Date of Birth: 11-26-1965  Today's Date: 10/02/2022 OT Individual Time: 1420-1535 OT Individual Time Calculation (min): 75 min    Short Term Goals: Week 1:  OT Short Term Goal 1 (Week 1): Pt will complete toilet transfers min A using LRAD OT Short Term Goal 2 (Week 1): Pt will recall hemi-dressing techniques with min verbal cues OT Short Term Goal 3 (Week 1): Pt will maintain sternal precautions during BADLs and functional transfers at 95% safe with min verbal cues OT Short Term Goal 4 (Week 1): Pt will maintain dynamic standing balance using LRAD during functional task >1 minute to demonstrate increased activity tolerance  Skilled Therapeutic Interventions/Progress Updates:  Pt received in bed and agreeable to therapy to focus on LUE.  While OT gathered equipment to take to room, NTs assisted to scoot pt up in bed so he could be set up in elevated chair position in bed.   Once sitting up, assessed strength:    R grasp  50 lbs,  L grasp 13 lbs R lateral pinch 21 lbs, 5 lbs R tip pinch 5 lbs , L 3 lbs  Pt does not have dexterity to manage his LVAD cords due to severe weakness and limited ROM due to edema.  Addressed edema with removal of ktape to hand and performed retrograde massage with hand positioned elevated on pillows.  Little progress made with edema, so applied light pressure with coban wrapping on hand.  Recommended to RN and pt to leave on for 24 hours unless he finds it too tight or uncomfortable.  The OT will remove it tomorrow.     For NMR to LUE: -a/arom of shoulder with sliding hand up and down powder board with placing and holding focusing on tricep isometric strength -simulation of functional movements such as cleaning a table with the board moving a towel -towel scrunches with hand with A/a for full finger flex and ext - grasp and release of dowel bar -AROM of elb  flexion -bilateral hands on dowel bar for reaching bar out with a/arom. -provided pt with foam block for active hand squeezes.  Recommended 10 x every 10 min to help with edema.  Pt resting in bed with all needs met and alarm set.   Therapy Documentation Precautions:  Precautions Precautions: Sternal, Fall, Other (comment) Precaution Comments: LVAD Restrictions Weight Bearing Restrictions: No RUE Weight Bearing:  (sternal precautions) LUE Weight Bearing:  (sternal precautions) RLE Weight Bearing: Weight bearing as tolerated (no restrictions) Other Position/Activity Restrictions: Sternal Precautions Vital Signs: Therapy Vitals BP: (!) 89/75 Patient Position (if appropriate): Lying Pain: Pain Assessment Pain Scale: 0-10 Pain Score: 0-No pain ADL: ADL Eating: Minimal assistance Where Assessed-Eating: Bed level Grooming: Minimal assistance, Moderate assistance Where Assessed-Grooming: Edge of bed Upper Body Bathing: Moderate assistance Where Assessed-Upper Body Bathing: Edge of bed Lower Body Bathing: Maximal assistance Where Assessed-Lower Body Bathing: Edge of bed Upper Body Dressing: Maximal assistance Where Assessed-Upper Body Dressing: Edge of bed Lower Body Dressing: Maximal assistance Where Assessed-Lower Body Dressing: Edge of bed Toileting: Maximal assistance Where Assessed-Toileting: Bed level Toilet Transfer: Moderate assistance Toilet Transfer Method: Stand pivot Toilet Transfer Equipment: Animator Transfer: Not assessed Film/video editor: Not assessed   Therapy/Group: Individual Therapy  Charles Holmes 10/02/2022, 10:37 AM

## 2022-10-02 NOTE — Progress Notes (Signed)
Physical Therapy Session Note  Patient Details  Name: Charles Holmes MRN: [id number] Date of Birth: October 19, 1965  Today's Date: 10/02/2022 PT Individual Time: 0102-7253 PT Individual Time Calculation (min): 60 min   Short Term Goals: Week 1:  PT Short Term Goal 1 (Week 1): Pt will require min A consisntely for bed mobility PT Short Term Goal 2 (Week 1): Pt will require mod A with sit to stand with LRAD PT Short Term Goal 3 (Week 1): Pt will require max A x 1 for gait x 10 ft with LRAD  Skilled Therapeutic Interventions/Progress Updates:      Pt supine in bed with nursing in room. Pt agreeable to therapy. Pt denies any pain.   Supine to sit with mod A, verbal and tactile cues provided for sternal precautions. Seated EOB, donned pants with total A for time/energy conservation. Detached LVAD from wall to portable batteries, pt unable to perform 2/2 L UE plegia, but able to report items needed in bag prior to leaving room. Pt requesting to use bathroom. Stand pivot transfer with L HHA, to BSC, verbal and tactile cues provided for technique with sit<>stand pt unable to have BM. Sit to stand with +1 min A, gait x 30, x100 feet with L HHA, with +2 for WC follow and IV management. Verbal and tactile cues for reciprocal gait.   Pt requesting to use bed pan, stand pivot trasnfer to bed with L HHA, nurse in room at end of session to place bed pan.   Therapist Detached LVAD from portable batteries to wall at end of session.   Therapy Documentation Precautions:  Precautions Precautions: Sternal, Fall, Other (comment) Precaution Comments: LVAD Restrictions Weight Bearing Restrictions: No RUE Weight Bearing:  (sternal precautions) LUE Weight Bearing:  (sternal precautions) RLE Weight Bearing: Weight bearing as tolerated (no restrictions) Other Position/Activity Restrictions: Sternal Precautions  Therapy/Group: Individual Therapy  [facility] Ambrose Finland, [place], DPT  10/02/2022, 7:40 AM

## 2022-10-03 LAB — PROTIME-INR
INR: 1.8 — ABNORMAL HIGH (ref 0.8–1.2)
Prothrombin Time: 20.7 s — ABNORMAL HIGH (ref 11.4–15.2)

## 2022-10-03 LAB — BASIC METABOLIC PANEL
Anion gap: 4 — ABNORMAL LOW (ref 5–15)
BUN: 9 mg/dL (ref 6–20)
CO2: 28 mmol/L (ref 22–32)
Calcium: 8.3 mg/dL — ABNORMAL LOW (ref 8.9–10.3)
Chloride: 107 mmol/L (ref 98–111)
Creatinine, Ser: 0.6 mg/dL — ABNORMAL LOW (ref 0.61–1.24)
GFR, Estimated: >60 mL/min (ref >60)
Glucose, Bld: 79 mg/dL (ref 70–99)
Potassium: 3.9 mmol/L (ref 3.5–5.1)
Sodium: 139 mmol/L (ref 135–145)

## 2022-10-03 MED ORDER — NYSTATIN 100000 UNIT/GM EX POWD
Freq: Two times a day (BID) | CUTANEOUS | Status: DC
  Filled 2022-10-03: qty 15

## 2022-10-03 MED ORDER — WARFARIN SODIUM 2.5 MG PO TABS
5.0000 mg | ORAL_TABLET | Freq: Once | ORAL | Status: AC
  Administered 2022-10-03: 5 mg via ORAL
  Filled 2022-10-03: qty 2

## 2022-10-03 MED ORDER — FUROSEMIDE 40 MG PO TABS
40.0000 mg | ORAL_TABLET | Freq: Once | ORAL | Status: AC
  Administered 2022-10-03: 40 mg via ORAL
  Filled 2022-10-03: qty 1

## 2022-10-03 NOTE — Progress Notes (Signed)
ANTICOAGULATION CONSULT NOTE - Follow-up  Pharmacy Consult for warfarin Indication:  LVAD  Allergies  Allergen Reactions   Pacerone [Amiodarone] Other (See Comments)    Severe tremors   Percocet [Oxycodone-Acetaminophen] Itching    Patient Measurements: Height: 5\' 7"  (170.2 cm) Weight: 79.7 kg (175 lb 11.3 oz) IBW/kg (Calculated) : 66.1 Heparin Dosing Weight: 87kg  Vital Signs: Temp: 98.9 F (37.2 C) (09/17 0538) Temp Source: Oral (09/17 0538) BP: 96/70 (09/17 1023) Pulse Rate: 60 (09/17 1023)  Labs: Recent Labs    10/01/22 0339 10/02/22 0236 10/03/22 0500  HGB 8.9* 8.7*  --   HCT 31.2* 30.7*  --   PLT 251 235  --   LABPROT 23.1* 22.6* 20.7*  INR 2.0* 2.0* 1.8*  CREATININE 0.67  0.71 0.80 0.60*    Estimated Creatinine Clearance: 100.5 mL/min (A) (by C-G formula based on SCr of 0.6 mg/dL (L)).   Medical History: Past Medical History:  Diagnosis Date   CAD (coronary artery disease)    CHF (congestive heart failure) (HCC)    COPD (chronic obstructive pulmonary disease) (HCC)    GERD (gastroesophageal reflux disease)    Hyperlipidemia    Hypertension    LVAD (left ventricular assist device) present (HCC)    Stroke (HCC)    Systolic heart failure (HCC) 2021   LVEF 18%, RVEF 38% on cardiac MRI 12/19/2019. possible cardiac sarcoidosis.   Wide-complex tachycardia 2021   wears LifeVest     Assessment: 57 yoM on apixaban PTA for hx AF admitted for LVAD workup. Pt s/p HM3 implant on 8/13 c/b acute CVA postop. Started low dose heparin + warfarin started 8/19. Low-dose heparin was stopped 8/31.  INR subtherapeutic on 9/15 at 1.8, CBC remains stable. No s/sx of bleeding. Oral intake of scheduled meals is improving. Dose of 3 mg given yesterday.  Goal of Therapy:  INR 2-2.5 Monitor platelets by anticoagulation protocol: Yes   Plan:  Warfarin 5 mg tonight Monitor daily INR, CBC  Thank you for allowing pharmacy to participate in this patient's care,  Wilmer Floor, PharmD PGY2 Cardiology Pharmacy Resident Please see AMION for all Pharmacists' Contact Phone Numbers 10/03/2022, 11:22 AM

## 2022-10-03 NOTE — Progress Notes (Signed)
This chaplain is present for F/U spiritual care.   The Pt. positively greets the chaplain with his successes in therapy and the plans for the next therapy visit. The Pt. adds the news of the therapy team's conversation on Wednesday. The Pt. shares he is anticipating his d/c date and the opportunity to hold his new granddaughter.  The Pt. articulates the left side weakness as we plan a card game in the near future. The Pt. also accepts trying the left side first and moving to the right if needed. The Pt. accepts "one day at a time" with his vision of not returning to the hospital.   The Pt. receives a phone call and the chaplain communicates plans for F/U.  Chaplain Stephanie Acre [phone]

## 2022-10-03 NOTE — Progress Notes (Addendum)
Patient ID: Charles Holmes, male   DOB: 01-29-1965, 57 y.o.   MRN: [id number]     Advanced Heart Failure Rounding Note  PCP-Cardiologist: Norman Herrlich, MD  [facility]: Dr. Gala Romney   Subjective:    Admitted to CIR on 09/11  INR 1.8 today.    Feels good this morning. Plan to work on left arm getting stronger.   LVAD Interrogation HM III: Speed: 5400  Flow: 4.7  PI: 2.6 Power: 4  VAD interrogated personally. Parameters stable.  Objective:   Weight Range: 79.7 kg Body mass index is 27.52 kg/m.   Vital Signs:   Temp:  [98 F (36.7 C)-98.9 F (37.2 C)] 98.9 F (37.2 C) (09/17 0538) Pulse Rate:  [71-95] 79 (09/17 0811) Resp:  [14-20] 18 (09/17 0811) BP: (87-112)/(75-86) 112/86 (09/17 0541) SpO2:  [93 %-98 %] 95 % (09/17 0811) Weight:  [79.7 kg] 79.7 kg (09/17 0500) Last BM Date : 10/02/22  Weight change: Filed Weights   10/01/22 0500 10/02/22 0500 10/03/22 0500  Weight: 78.6 kg 79.7 kg 79.7 kg   Intake/Output:   Intake/Output Summary (Last 24 hours) at 10/03/2022 0929 Last data filed at 10/03/2022 0844 Gross per 24 hour  Intake 1075 ml  Output 870 ml  Net 205 ml   Physical Exam  General:  Well appearing. No resp difficulty HEENT: Normal Neck: supple. JVP ~8. Carotids 2+ bilat; no bruits. No lymphadenopathy or thyromegaly appreciated. Cor: Mechanical heart sounds with LVAD hum present. Lungs: Clear Abdomen: soft, nontender, nondistended. No hepatosplenomegaly. No bruits or masses. Good bowel sounds. Driveline: C/D/I; securement device intact and driveline incorporated Extremities: no cyanosis, clubbing, rash, +1-2 BLE edema. L arm weaker than R Neuro: alert & orientedx3, cranial nerves grossly intact. moves all 4 extremities w/o difficulty. Affect pleasant      Labs    CBC Recent Labs    10/01/22 0339 10/02/22 0236  WBC 8.2 7.8  NEUTROABS 5.6 5.3  HGB 8.9* 8.7*  HCT 31.2* 30.7*  MCV 95.7 95.9  PLT 251 235   Basic Metabolic Panel Recent Labs     10/02/22 0236 10/03/22 0500  NA 139 139  K 3.6 3.9  CL 105 107  CO2 27 28  GLUCOSE 88 79  BUN 7 9  CREATININE 0.80 0.60*  CALCIUM 8.0* 8.3*   BNP: BNP (last 3 results) Recent Labs    09/11/22 2252 09/18/22 2246 09/25/22 2351  BNP 933.0* 809.0* 851.4*   Imaging   No results found.  Medications:     Scheduled Medications:  aspirin EC  81 mg Oral Daily   atorvastatin  80 mg Oral Daily   Chlorhexidine Gluconate Cloth  6 each Topical BID   digoxin  0.125 mg Oral Daily   dorzolamide-timolol  1 drop Both Eyes BID   feeding supplement  237 mL Oral TID BM   Gerhardt's butt cream   Topical TID   influenza vac split trivalent PF  0.5 mL Intramuscular Tomorrow-1000   latanoprost  1 drop Both Eyes QHS   melatonin  5 mg Oral QHS   mexiletine  250 mg Oral BID   mometasone-formoterol  2 puff Inhalation BID   multivitamin with minerals  1 tablet Oral Daily   nystatin   Topical BID   pantoprazole  40 mg Oral Daily   sertraline  50 mg Oral Daily   sildenafil  20 mg Oral TID   traZODone  50 mg Oral QHS   umeclidinium bromide  1 puff Inhalation Daily  Warfarin - Pharmacist Dosing Inpatient   Does not apply q1600    Infusions:  sodium chloride 250 mL (10/01/22 1934)   ampicillin (OMNIPEN) IV 2 g (10/03/22 0531)   cefTRIAXone (ROCEPHIN)  IV 2 g (10/03/22 0853)    PRN Medications: sodium chloride, acetaminophen, albuterol, alum & mag hydroxide-simeth, bisacodyl, guaiFENesin-dextromethorphan, ondansetron **OR** ondansetron (ZOFRAN) IV, mouth rinse, polyethylene glycol, sodium chloride, sodium chloride flush, sodium chloride flush, sodium phosphate, traMADol   Patient Profile  Charles Holmes is a 57 y.o. 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 Admitted with cardiogenic shock, stabilized and underwent HM3 LVAD. Post implant course c/b acute CVA and enterococcus faecalis bacteremia.   Assessment/Plan  1.  Acute on  chronic Systolic HF-->Cardiogenic Shock  - Diagnosed 11/2019. Presented with VT. LHC 70% LAD  - cMRI 12/21 concerning for sarcoid and EF 18%.  - [facility] 2/[facility] EF 25% + active sarcoid - Echo 08/26/20 EF < 20% severely dilated LV RV mildly decreased.  - Medtronic CRT-D upgrade in 06/08/21 - Echo 07/10/22: EF <20%, RV okay, mod pericardial effusion, mod Charles/TR - Admitted 07/25 with cardiogenic shock. - RHC: Nonobstructive CAD, severely elevated filling pressures and low Fick CO/CI (2.7/1.4) - 08/13 HM III LVAD implant + clipping LAA d/t severe thickening and invagination of mitral valve annulus impeding flows.  - Apical core sent - no mention of sarcoid - Speed increased to 5300 on 08/14.  - Echo 8/26 mod-sev RV dysfunction. Speed increased to 5400 8/26 - RHC 8/27: low filing pressures with excellent cardiac output on EPI, PAPi 4.6 - Ramp Echo 9/4, speed increased to 5400  - MAPs stable suspect 55 charted inaccurate - On Sildenafil 20 mg tid.  - Volume status mildly elevated today. 40 PO lasix today and place ted hose.  - Continue digoxin 0.125 mg daily  2. HM-3 LVAD - VAD interrogated personally. Parameters stable. - Check LHD in am.  - Continue ASA 81 - INR 1.8. Continue warfarin.   3.  Acute stroke - Hx CVA 06/24 -Admitted 06/24 w/ R MCA stroke. S/p TPA and mechanical clot extraction. No residual deficits. Likely cardioembolic in setting of severe LV dysfunction. - Developed left sided weakness 08/14. CTA with R MCA infarct. Taken to IR for thrombectomy - Repeat CT head with small to moderate size hemorrhagic conversion.  - repeat head CT on 8/17 w/ improved hemorrhagic CVA - Back on warfarin. - Continue ASA 81 mg daily - Appreciate CIR.  4. Enterococcus faecalis bacteremia - Bcx 2/2 on 9/6 - ID consult 9/6 -> ampicillin and ceftriaxone - TEE recommended but doubt it will change management as VAD cannot be removed and will require IV abx followed by long-term suppression - Echo  09/10 - no obvious vegetations -  Repeat BCx 9/8 NGTD. - ID recommending 6 weeks IV amp/ceftriaxone followed by long-term suppression with amoxicillin. Stop date 11/04/22  PICC replaced 9/14.  5. Hx VT - ln setting of potential sarcoid heart disease  - Off amio due to tremor. Continue mexiletine  - now s/p ICD.   6. CAD - LHC 12/07/19 70-% LAD, no intervention - LHC 8/24 non obstructive CAD.  - Continue statin. On aspirin for VAD.  7. 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  - holding methotrexate w/ recent surgery and active infection, can discuss timing of restarting the medication at outpatient follow-up -  apical core pathology not diagnostic of cardiac sarcoidosis.   8. 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 AKI - suspect cardiorenal, improved w/ inotropic support - Resolved.    10. Iron deficiency anemia/ Post-op anemia - Has had IV Fe.    11. Pulmonary  - PFTs with severe obstructive defect, response to bronchodilator. FEV1 1.04L, FEV1/FVC 48% - Stable  12. Psych - Depression and delirium post VAD implant. Prolonged hospital course.  - Improving - Continue sertraline  Charles Holmes AGACNP-BC  10/03/2022  Agree with the above NP note.   Working with PT, getting stronger.  Still with left-sided weakness.  Adjust warfarin for INR 2-2.5. MAP stable.   Charles Holmes 10/03/2022

## 2022-10-03 NOTE — Progress Notes (Signed)
PROGRESS NOTE   Subjective/Complaints:  Appreciate CVTS and cards notes   ROS:   Pt denies SOB, abd pain, CP, N/V/C/D, and vision changes  Objective:   No results found. Recent Labs    10/01/22 0339 10/02/22 0236  WBC 8.2 7.8  HGB 8.9* 8.7*  HCT 31.2* 30.7*  PLT 251 235    Recent Labs    10/02/22 0236 10/03/22 0500  NA 139 139  K 3.6 3.9  CL 105 107  CO2 27 28  GLUCOSE 88 79  BUN 7 9  CREATININE 0.80 0.60*  CALCIUM 8.0* 8.3*    Intake/Output Summary (Last 24 hours) at 10/03/2022 0912 Last data filed at 10/03/2022 0844 Gross per 24 hour  Intake 1075 ml  Output 870 ml  Net 205 ml        Physical Exam: Vital Signs Blood pressure 112/86, pulse 79, temperature 98.9 F (37.2 C), temperature source Oral, resp. rate 18, height 5\' 7"  (1.702 m), weight 79.7 kg, SpO2 95%.   General: No acute distress Mood and affect are appropriate Heart: Regular rate and rhythm no rubs murmurs or extra sounds Lungs: Clear to auscultation, breathing unlabored, no rales or wheezes Abdomen: Positive bowel sounds, soft nontender to palpation, nondistended Extremities: No clubbing, cyanosis, or edema Skin: erythematous well demarcated inguinal fold macular rash    Skin: chest incisions clean healing well , LVAD drive site clean/dressed  PRIOR EXAMS: Neuro:  Alert and oriented x 3. Fair insight and awareness. Intact Memory. Normal language and speech. Cranial nerve exam unremarkable except for mild left central 7. MMT: LUE grossly 3- to 3/5 prox to distal. LLE 4-/5 throughout. Pt with intact  LT sense on left but feels a little different compared to R side . No abnl resting tone. DTR's tr to1+.   Musculoskeletal: Full ROM, No pain with AROM or PROM in the neck, trunk, or extremities. Posture appropriate     Assessment/Plan: 1. Functional deficits which require 3+ hours per day of interdisciplinary therapy in a comprehensive  inpatient rehab setting. Physiatrist is providing close team supervision and 24 hour management of active medical problems listed below. Physiatrist and rehab team continue to assess barriers to discharge/monitor patient progress toward functional and medical goals  Care Tool:  Bathing    Body parts bathed by patient: Chest, Left arm, Abdomen, Front perineal area, Face   Body parts bathed by helper: Buttocks, Right upper leg, Left upper leg, Right lower leg, Left lower leg, Right arm     Bathing assist Assist Level: Maximal Assistance - Patient 24 - 49%     Upper Body Dressing/Undressing Upper body dressing   What is the patient wearing?: Pull over shirt    Upper body assist Assist Level: Maximal Assistance - Patient 25 - 49%    Lower Body Dressing/Undressing Lower body dressing      What is the patient wearing?: Pants     Lower body assist Assist for lower body dressing: Minimal Assistance - Patient > 75%     Toileting Toileting    Toileting assist Assist for toileting: Maximal Assistance - Patient 25 - 49%     Transfers Chair/bed transfer  Transfers  assist  Chair/bed transfer activity did not occur: Safety/medical concerns (fatigue)  Chair/bed transfer assist level: Moderate Assistance - Patient 50 - 74%     Locomotion Ambulation   Ambulation assist   Ambulation activity did not occur: Safety/medical concerns (fatigue)          Walk 10 feet activity   Assist  Walk 10 feet activity did not occur: Safety/medical concerns (fatigue)        Walk 50 feet activity   Assist Walk 50 feet with 2 turns activity did not occur: Safety/medical concerns         Walk 150 feet activity   Assist Walk 150 feet activity did not occur: Safety/medical concerns         Walk 10 feet on uneven surface  activity   Assist Walk 10 feet on uneven surfaces activity did not occur: Safety/medical concerns (fatigue)         Wheelchair     Assist  Is the patient using a wheelchair?: Yes Type of Wheelchair: Manual Wheelchair activity did not occur: Safety/medical concerns (fatigue)         Wheelchair 50 feet with 2 turns activity    Assist    Wheelchair 50 feet with 2 turns activity did not occur: Safety/medical concerns       Wheelchair 150 feet activity     Assist  Wheelchair 150 feet activity did not occur: Safety/medical concerns       Blood pressure 112/86, pulse 79, temperature 98.9 F (37.2 C), temperature source Oral, resp. rate 18, height 5\' 7"  (1.702 m), weight 79.7 kg, SpO2 95%.  Medical Problem List and Plan: 1. Functional deficits secondary to acute right MCA infarct/ LVAD pt             -patient may shower             -ELOS/Goals: modI/S 10-14 days   -Con't CIR PT, OT and SLP 2.  Antithrombotics: -DVT/anticoagulation:  Pharmaceutical: Coumadin  -9/14- managed by pharmacy- INR 1.8 today  -10/01/22 INR 2.0 today, appreciate pharm support             -antiplatelet therapy: Aspirin 81 mg daily   3. Pain Management: Tylenol and Tramadol as needed   4. Mood/Behavior/Sleep: delirium/insomnia - team to provide ongoing ego support             -continue mirtazapine 7.5 mg q HS--not ordered? Defer to weekday team             -continue sertraline 50 mg daily             -continue melatonin 3 mg q HS -Started Trazodone 50mg  at bedtime on 09/29/22             -antipsychotic agents: n/a -9/13- mood somewhat better than when saw pt Tuesday- con't regimen and monitor -09/30/22 not sleeping well, increased Melatonin to 5mg  QHS; if not helping, may consider restarting mirtazapine as above -10/01/22 slept a bit better, weekday team to assess need for mirtazapine tomorrow 5. Neuropsych/cognition: This patient is capable of making decisions on his own behalf.   6. Skin/Wound Care: Routine skin care checks   7. Fluids/Electrolytes/Nutrition: strict Is and Os and follow-up chemistries             -poor PO intake  on Marinol             -continue supplements   -9/12 labs reviewed. low albumin still continue interventions above  8: hypotension but  difficult to assess due to LVAD    9/13- BP soft, but denies orthostatic hypotension -9/14-15/24 BPs generally stable/improving; was previously on midodrine, but per HF team pt was stable off it; monitor need for restarting Vitals:   10/01/22 1218 10/01/22 1759 10/01/22 1953 10/02/22 0035  BP: 96/78 (!) 95/17 (!) 84/72 100/87   10/02/22 0435 10/02/22 0820 10/02/22 1214 10/02/22 1407  BP: 106/84 (!) 89/75 98/84 93/75    10/02/22 1627 10/02/22 2029 10/03/22 0144 10/03/22 0541  BP: 97/78 94/75 (!) 87/77 112/86     9: Hyperlipidemia: continue atorvastatin 80mg  daily   10: Enterococcus bacteremia: 6 weeks IV antibiotic, ceftriaxone and ampicillin last dose 10/19 will go home with PICC and HH             -follow-up with ID  -09/30/22 PICC placed this AM  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: Home meds: Coreg 3.125, Lasix 40mg , Lostartan 25mg , Aldactone 12.5, Farxiga 10mg , mexilitene --holding all currently   12: Acute on chronic systolic heart failure with cardiogenic shock s/p LVAD             -daily weights balanced so far             -continue digoxin 0.125 mg daily             -continue mexiletine 250 mg BID -continue midodrine 5 mg q 8 hours--not ordered, per HF team on 9/13 pt was stable off Midodrine; hold for now             -continue sildenafil 20 mg q 8 hours             -continue warfarin per pharmacy   Managed by Heart Failure team - coordinating with primary service for activity tolerance monitoring  Filed Weights   10/01/22 0500 10/02/22 0500 10/03/22 0500  Weight: 78.6 kg 79.7 kg 79.7 kg    13: Hx of VT: continue mexiletine, digoxin; has ICD   14: DM-2: A1c = 5.5%; monitor PO intake; DC SSI (Farxiga at home)   15: OSA: needs outpatient sleep study   16: Anemia, iron deficiency: follow-up CBC--most recent hgb 9.5--lab pending still today?  -     Latest Ref Rng & Units 10/02/2022    2:36 AM 10/01/2022    3:39 AM 09/27/2022    2:26 AM  CBC  WBC 4.0 - 10.5 K/uL 7.8  8.2  9.7   Hemoglobin 13.0 - 17.0 g/dL 8.7  8.9  9.5   Hematocrit 39.0 - 52.0 % 30.7  31.2  32.6   Platelets 150 - 400 K/uL 235  251  307       17: Severe COPD:              -continue Ellipta one puff daily   18: S/p tooth extractions this admission: monitor PO tolerance   19: Glaucoma: continue Cosopt and Xalatan   20.  Candidiasis in groin start nystatin powder  LOS: 6 days A FACE TO FACE EVALUATION WAS PERFORMED  Erick Colace 10/03/2022, 9:12 AM

## 2022-10-03 NOTE — Progress Notes (Signed)
LVAD Coordinator Rounding Note:  Admitted 08/10/22 due to acute on chronic CHF with cardiogenic shock. Milrinone dependent. Advance therapy workup completed, and pt deemed acceptable VAD candidate. Dental extractions completed 8/6. IABP placed 08/25/22.  HM 3 LVAD implanted on 08/29/22 by Dr Donata Clay under destination therapy criteria. 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 Result negative.  7/25 Admit with cardiogenic shock. Started milrinone and NE. 8/6 S/P 13 teeth extractions  8/9 IABP placed 8/13 S/p HM III LVAD implant + clipping left atrial appendage d/t severe thickening and invagination of mitral valve annulus impeding flows  CT Head 8/14 (initial) 1. 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 ASPECTS is 7. 2. No acute hemorrhage.  CT Angio Head/Neck 1. 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 2. Core infarct of 12 cc (somewhat underestimated compared to aspects) with 90 cc of penumbra. 3. Mild atherosclerosis.  Pt taken emergently to IR for percutaneous right common carotid arteriogram with thrombectomy. Revascularization achieved. Angio-seal closure device applied to left groin- clean, dry, and intact.   CT Head 8/15 @ 0450 Unchanged extent of infarct and hemorrhage in the right MCA distribution including small volume intraventricular clot. No hydrocephalus.  CT Head 8/17 @ 0701 Interval evolution of the right MCA territory infarct with decreased intraparenchymal and intraventricular hemorrhage. No hydrocephalus or midline shift.   Pt lying in bed this morning in good spirits. Plan to start therapy at 0915. Resp in the room administering inhalers.   Blood cultures from 9/6 positive for Enterococcus Faecalis. Subsequent blood cultures NGTD. ID following recommending 6 weeks of IV Ampicillin and Ceftriaxone followed by chronic suppression with Amoxicillin.   VAD coordinator/nurses working with pt on changing  power sources. He is unable to do this due to his left sided weakness. Modification of his power cords was made in hopes he can grip the cords better. Has practice power cords for use by pt. See below for education documentation. He is making progress with therapy.  Pt may not shower at this time.   Vital signs: Temp: 98.9 HR: 79 Doppler Pressure: 82 Automatic BP: 112/86 (95) O2 Sat: 95% on RA Wt: 183.6>191.1>190.9>190.2>184.8>...>163.1>162.5>171.3>172.4>176.6> 180.6>175.1>174.8>176.3>175.7bs   LVAD interrogation reveals:  Speed: 5400 Flow: 4.2 Power: 3.9 w PI: 5.4  Alarms: none Events: 4 PI today  Hematocrit: 30 Fixed speed: 5400 Low speed limit: 5100  Drive Line: CDI. Drive line anchor secure. Continue twice a week dressing changes on Monday/Thursday. Next dressing change due 10/05/22 by VAD coordinator or nurse champion only.   Labs:  LDH trend: 596>441>165>384>345>312>312>350>327>309>316>305>298>277>273>298>280>285>251>252  INR trend: 1.4>1.6>1.1>1.1>1.3>1.2>1>1.3>1.4>1.5>1.6>2.8>2.8>2.3>2.2>2.3>2.6>2.6>1.7>2>1.8  AST/ALT trend: 218/45>96/30>73/23>63/36>58/37>69/50>93/72>123/103>143/150>117/138>119/138>92/127>87/123>50/71>46/65>46/43  Total Bili trend: 4.4>4.6>5.1>1.8>1.9>1.2>1.0>1.5>1.1>0.8>0.9>1.0>0.8  WBC trend: 11.6>13.7>19.6>16.1>23>26.6>21>24.6>18.1>16.5>13.2>13.1>12.5>11.6>10.5>10.8>13.1>9.5>9.1>9.7>7.8  Anticoagulation Plan: -INR Goal: 2.0 - 2.5 -ASA Dose: 81 mg  - Coumadin dosing per pharmacy  Blood Products:  IntraOp 8/14: - 4 FFP - 2 Platelets - 2 PRBC - 1 cyro - 449 cc of cellsaver - DDAVP 20 mcg x 1   Device: Medtronic BiV -Therapies: ON VF ON >207 BPM FVT ON 176-207 BPM VT ON 171-207 BPM  Arrythmias:   Respiratory:   Infection:  09/01/22>> sputum cx>> NGTD>>final 09/01/22>> blood cx>> NGTD>>final 09/01/22>> urine cx>> NGTD>> final 09/13/22>>blood cxs>>no growth 5 days; final 09/22/22>>blood cxs>> Enterococcus faecalis  09/23/22>> blood  cxs>> neg final 09/24/22>> blood cxs>> neg final  Renal:  9/12: BUN/CRT: 8/0.62  Adverse Events on VAD: - 08/30/22: - Developed left sided weakness this am. CTA with R MCA infarct. Taken to  IR for thrombectomy   Drips:    Patient Education: Discharge teaching completed with pt's [name] on 9/5. See separate note for documentation.   Plan/Recommendations:  Please page VAD coordinator for any alarms or VAD equipment issues. Continue twice a week dressing changes on Monday/Thursday by VAD coordinator, Nurse Alla Feeling or [place].  Carlton Adam RN,BSN VAD Coordinator  Office: [phone]  24/7 Pager: [phone]

## 2022-10-03 NOTE — Progress Notes (Signed)
Attempted to obtain BP on Left arm, but readings were what seems to be inaccurate. Left arm is very edematous. This nurse and another nurse attempted a manual BP on left arm as well and could not get a reading. Brachial pulse was hard to find d/t edema, but the radial pulse was palpable. BP obtained on L leg. Until swelling goes down in left arm, I recommend only using legs to obtain BP.

## 2022-10-03 NOTE — Progress Notes (Signed)
PHARMACY CONSULT NOTE FOR:  OUTPATIENT  PARENTERAL ANTIBIOTIC THERAPY (OPAT)  Indication: enterococcus bacteremia in LVAD driveline Regimen: ampicillin 12g IV daily as a continuous infusion and ceftriaxone 2g IV every 12 hours  End date: 11/04/2022  IV antibiotic discharge orders are pended. To discharging provider:  please sign these orders via discharge navigator,  Select New Orders & click on the button choice - Manage This Unsigned Work.     Thank you for allowing pharmacy to participate in this patient's care,  Sherron Monday, PharmD, BCCCP Clinical Pharmacist  Phone: [phone] 10/03/2022 1:14 PM  Please check AMION for all [facility] phone numbers After 10:00 PM, call Main Pharmacy [phone]

## 2022-10-03 NOTE — Progress Notes (Signed)
Speech Language Pathology Daily Session Note  Patient Details  Name: Charles Holmes MRN: [id number] Date of Birth: January 21, 1965  Today's Date: 10/03/2022 SLP Individual Time: 1415-1455 SLP Individual Time Calculation (min): 40 min  Short Term Goals: Week 1: SLP Short Term Goal 1 (Week 1): Patient will attend to left field of enviornment during functional tasks with supervision level verbal cues. SLP Short Term Goal 2 (Week 1): Patient will recall sequence to switching to LVAD batteries with Min verbal and visual cues. SLP Short Term Goal 3 (Week 1): Patient will recall reasoning for specific LVAD alarms with Min verbal and visual cues. SLP Short Term Goal 4 (Week 1): Patient will utilize speech intelligibility strategies at the phrase level to achieve ~80% intelligibility with Min verbal cues.  Skilled Therapeutic Interventions: Skilled treatment session focused on cognitive and communication goals. Upon arrival, patient was talking on his cell phone and demonstrating improved vocal intensity. Throughout functional conversation with SLP, patient demonstrated appropriate vocal intensity but required Min A verbal cues for use of a slow rate and over-articulation to improve his overall intelligibility to 80% at the sentence level.  Max A verbal cues were also needed for recall regarding sequence to switching his LVAD power from the wall to batteries, items that should be in his travel bag at all times, and general meanings of different alarms. SLP was unable to locate an easy "cheat sheet" to help with recalling appropriate sequencing of task in his educational materials but will locate or produce one for future use. Overall, patient appeared more awake and engaged compared to previous sessions. Patient left upright in bed with alarm on and all needs within reach. Continue with current plan of care.      Pain No/Denies Pain   Therapy/Group: Individual Therapy  Jontae Sonier 10/03/2022, 3:13 PM

## 2022-10-03 NOTE — Progress Notes (Signed)
Physical Therapy Session Note  Patient Details  Name: Charles Holmes MRN: [id number] Date of Birth: October 22, 1965  Today's Date: 10/03/2022 PT Individual Time: 1053-1205 PT Individual Time Calculation (min): 72 min   Short Term Goals: Week 1:  PT Short Term Goal 1 (Week 1): Pt will require min A consisntely for bed mobility PT Short Term Goal 2 (Week 1): Pt will require mod A with sit to stand with LRAD PT Short Term Goal 3 (Week 1): Pt will require max A x 1 for gait x 10 ft with LRAD  Skilled Therapeutic Interventions/Progress Updates: Patient supine in bed on entrance to room. Patient alert and agreeable to PT session.   Patient reported no pain at beginning of PT session  Therapeutic Activity: Bed Mobility: Pt performed supine<>sit EOB with minA (HOB elevated). VC required for pt to use B LE to push self up to [facility] with PTA assisting via chuck at end of session (maxA). Transfers: Pt performed sit<>stand transfers throughout session with light minA for safety, and VC for pt to anteriorly scoot to edge. PTA provided L HHA.  - Pt requested to void bladder. Pt ambulated from EOB to toilet and required minA to maintain standing balance. Pt standing over toilet with CGA, and minA to doff/donn back side of personal shorts, otherwise pt self assisted voiding. Pt then ambulated back to [facility] to be transported to main gym dependently for time management.  - PTA donned LVAD battery from mobile unit to home unit with pt verbally assisting for time management at end of session.  Neuromuscular Re-ed: NMR facilitated during session with focus on neuromuscular activation, control and coordination. - Pt sitting in WC and L UE in full extension over L of WC. Pt with 2lb dumbbell (Coban donned to increase grip) with instructions to perform elbow flexion for 10 reps, and then to maintain  L UE in extension with weight to roll out of grip to PIP, pause, then back into flexion at digits + wrist. PTA provided block  of weight to ensure it does not drop for safety purposes. Pt with cues to focus on increasing activation on L 5th digit as it presented with decrease flexor strength.  - Pt standing with table in front that has 2lb dumbbell weight (Coban on handle to increase grip), on R with instructions to bring to L side without dropping. Lego blocks lined down middle for visual target to maintain control of weight in air. Pt performed multiple rounds with CGA for safety, and max cues to use all digits around dumbbell handle (pt with decrease flexion strength in 5th digit).  NMR performed for improvements in motor control and coordination, balance, sequencing, judgement, and self confidence/ efficacy in performing all aspects of mobility at highest level of independence.   Therapeutic Exercise: Pt performed the following exercises with therapist providing the described cuing and facilitation for improvement. - Pt ambulated 190' around main gym, and then roughly 54' from nsg station to room at end of session using L HHA at first, the progressed to CGA around back. Pt presented L lateral lean once L HHA was removed. PTA provided VC for pt to center weight (mod cueing). Pt reported B LE fatigue and increase in breathing and required a rest break after 2nd lap with symptoms decreasing shortly after sitting.   Patient supine in bed at end of session with brakes locked, bed alarm set, and all needs within reach.      Therapy Documentation Precautions:  Precautions  Precautions: Sternal, Fall, Other (comment) Precaution Comments: LVAD Restrictions Weight Bearing Restrictions: No RUE Weight Bearing:  (sternal precautions) LUE Weight Bearing:  (sternal precautions) RLE Weight Bearing: Weight bearing as tolerated Other Position/Activity Restrictions: Sternal Precautions  Therapy/Group: Individual Therapy  Clarion Mooneyhan PTA 10/03/2022, 1:04 PM

## 2022-10-03 NOTE — Progress Notes (Signed)
  Subjective: Much improved mood in rehab Good progress made with L side weakness, still having trouble with fine motor skills of L hand  Objective: Vital signs in last 24 hours: Temp:  [98 F (36.7 C)-98.9 F (37.2 C)] 98.9 F (37.2 C) (09/17 0538) Pulse Rate:  [71-95] 79 (09/17 0811) Resp:  [14-20] 18 (09/17 0811) BP: (87-112)/(75-86) 112/86 (09/17 0541) SpO2:  [93 %-98 %] 95 % (09/17 0811) Weight:  [79.7 kg] 79.7 kg (09/17 0500)  Hemodynamic parameters for last 24 hours:  Nsr Mean afterial BP stable 75-5 mm Hg  Intake/Output from previous day: 09/16 0701 - 09/17 0700 In: 1075 [P.O.:1075] Out: 1020 [Urine:1020] Intake/Output this shift: Total I/O In: 118 [P.O.:118] Out: -   EXAM  Alert, appropriate Lungs clear R arm PICC is secure Sternal incision healing Normal VAD hum Lab Results: Recent Labs    10/01/22 0339 10/02/22 0236  WBC 8.2 7.8  HGB 8.9* 8.7*  HCT 31.2* 30.7*  PLT 251 235   BMET:  Recent Labs    10/02/22 0236 10/03/22 0500  NA 139 139  K 3.6 3.9  CL 105 107  CO2 27 28  GLUCOSE 88 79  BUN 7 9  CREATININE 0.80 0.60*  CALCIUM 8.0* 8.3*    PT/INR:  Recent Labs    10/03/22 0500  LABPROT 20.7*  INR 1.8*   ABG    Component Value Date/Time   PHART 7.365 08/31/2022 1504   HCO3 33.2 (H) 09/12/2022 1108   HCO3 31.2 (H) 09/12/2022 1108   TCO2 35 (H) 09/12/2022 1108   TCO2 33 (H) 09/12/2022 1108   ACIDBASEDEF 4.0 (H) 08/31/2022 1504   O2SAT 66.9 09/16/2022 0539   CBG (last 3)  No results for input(s): "GLUCAP" in the last 72 hours.  Assessment/Plan: S/P  VAD implant for cardiogenic shock   POD #2 L body CVA, appreciate CIR therapy  On IV antibiotics  [PICC line] until 10-19 for enterococcal bacteremia, prob from UTI  Cont CIR until max benefit reached then home- post discharge education of the VAD equipment and cord exit site wound care is being done by VAD coordinators.   LOS: 6 days    Charles Holmes 10/03/2022

## 2022-10-03 NOTE — Progress Notes (Signed)
Occupational Therapy Session Note  Patient Details  Name: Charles Holmes MRN: [id number] Date of Birth: 27-Jan-1965  Today's Date: 10/03/2022 OT Individual Time: 4098-1191 OT Individual Time Calculation (min): 25 min  Missed 50 mins, pt eating breakfast   Short Term Goals: Week 1:  OT Short Term Goal 1 (Week 1): Pt will complete toilet transfers min A using LRAD OT Short Term Goal 2 (Week 1): Pt will recall hemi-dressing techniques with min verbal cues OT Short Term Goal 3 (Week 1): Pt will maintain sternal precautions during BADLs and functional transfers at 95% safe with min verbal cues OT Short Term Goal 4 (Week 1): Pt will maintain dynamic standing balance using LRAD during functional task >1 minute to demonstrate increased activity tolerance  Skilled Therapeutic Interventions/Progress Updates:  Pt greeted supine in bed, pt agreeable to OT intervention, however pt had not eaten breakfast yet. Set pt up for breakfast and returned once pt was finished.   Session focus on BADL reeducation, functional mobility, dynamic standing balance and decreasing overall caregiver burden.         Transferred pt over to LVAD batteries with total A for time mgmt. Pt completed supine>sit with MIN A to elevate trunk into sitting. Pt reports need to void b/b. Pt completed ambulatory transfer to bathroom while pushing IV with MINA.   Pt completed continent b/b void needing MAX A for toileting tasks. Education provided on compensatory methods for toileting hygiene d/t sternal precautions however pt reports, " I would fall of the toilet if I tried to lateral lean" therefore provided total A for pericare.   Pt exited bathroom in same manner as previously indicated. Pt agreeable to stay in w/c for next session. Elevated LUE on pillow to assist with swelling, additionally doffed coband wrap for swelling with mild improvements noted.              Ended session with pt seated in w/c with all needs within reach and  safety belt alarm activated.                   Therapy Documentation Precautions:  Precautions Precautions: Sternal, Fall, Other (comment) Precaution Comments: LVAD Restrictions Weight Bearing Restrictions: No RUE Weight Bearing:  (sternal precautions) LUE Weight Bearing:  (sternal precautions) RLE Weight Bearing: Weight bearing as tolerated Other Position/Activity Restrictions: Sternal Precautions  Pain: No pain    Therapy/Group: Individual Therapy  Pollyann Glen [facility] 10/03/2022, 10:14 AM

## 2022-10-04 ENCOUNTER — Telehealth (HOSPITAL_COMMUNITY): Payer: Self-pay

## 2022-10-04 LAB — MAGNESIUM: Magnesium: 1.6 mg/dL — ABNORMAL LOW (ref 1.7–2.4)

## 2022-10-04 MED ORDER — FUROSEMIDE 40 MG PO TABS
40.0000 mg | ORAL_TABLET | Freq: Every day | ORAL | Status: DC
  Administered 2022-10-04 – 2022-10-06 (×3): 40 mg via ORAL
  Filled 2022-10-04 (×3): qty 1

## 2022-10-04 MED ORDER — MAGNESIUM SULFATE 4 GM/100ML IV SOLN
4.0000 g | Freq: Once | INTRAVENOUS | Status: AC
  Administered 2022-10-04: 4 g via INTRAVENOUS
  Filled 2022-10-04: qty 100

## 2022-10-04 MED ORDER — WARFARIN SODIUM 2.5 MG PO TABS
5.0000 mg | ORAL_TABLET | Freq: Once | ORAL | Status: AC
  Administered 2022-10-04: 5 mg via ORAL
  Filled 2022-10-04: qty 2

## 2022-10-04 MED ORDER — SPIRONOLACTONE 12.5 MG HALF TABLET
12.5000 mg | ORAL_TABLET | Freq: Every day | ORAL | Status: DC
  Administered 2022-10-04 – 2022-10-05 (×2): 12.5 mg via ORAL
  Filled 2022-10-04 (×2): qty 1

## 2022-10-04 MED ORDER — POTASSIUM CHLORIDE CRYS ER 20 MEQ PO TBCR
40.0000 meq | EXTENDED_RELEASE_TABLET | Freq: Once | ORAL | Status: AC
  Administered 2022-10-04: 40 meq via ORAL
  Filled 2022-10-04 (×2): qty 2

## 2022-10-04 NOTE — Progress Notes (Signed)
Physical Therapy Session Note  Patient Details  Name: Charles Holmes MRN: [id number] Date of Birth: 1965/02/12  Today's Date: 10/04/2022 PT Individual Time: 1105-1202 PT Individual Time Calculation (min): 57 min   Short Term Goals: Week 1:  PT Short Term Goal 1 (Week 1): Pt will require min A consisntely for bed mobility PT Short Term Goal 2 (Week 1): Pt will require mod A with sit to stand with LRAD PT Short Term Goal 3 (Week 1): Pt will require max A x 1 for gait x 10 ft with LRAD  Skilled Therapeutic Interventions/Progress Updates: Patient supine in bed on entrance to room. Patient alert and agreeable to PT session.   Patient reported no pain on entrance to room, only that R LE metatarsal is slightly sore (no bruising or pain with movement, just slight soreness when palpated with no complaints during session). Pt also reported feelings of increased fatigue this morning due to medication provided the previous night.   Therapeutic Activity: Bed Mobility: Pt performed supine<>sit on EOB with HOB elevated and with minA this session. PTA advanced L UE over midline, and pt cued to use R UE to assist in truncal elevation, and to use L LE to bridge hips over. Transfers: Pt performed sit<>stand transfers throughout session with CGA. Provided VC for anterior lean, and to reach back to arm rest in WC to control descent.  - PTA doffed home LVAD unit to mobile unit with having pt provide verbal instructions on sequence with modA. Pt with PICC line hooked to IV pole on R UE, so battery vest threaded around R UE to ensure it did not occlude line.  - Pt requested to use restroom. Pt ambulated from EOB to toilet with PTA managing IV pole, and pt in RW for safety (CGA provided for safety). Pt demonstrated doffing anterior personal pants to void bladder in standing with minA to adjust gown. Pt then ambulated to [facility] to be dependently transported to day room gym.  Therapeutic Exercise: Pt performed the  following exercise to increase ambulatory tolerance. - Pt ambulated 180' using L HHA with pt managing IV pole on R. Pt also with CGA and needed a rest break at end of day room/nsg loop (no SOB, only some fatigue).  Patient sitting in WC at end of session with brakes locked, chair alarm set, and all needs within reach.      Therapy Documentation Precautions:  Precautions Precautions: Sternal, Fall, Other (comment) Precaution Comments: LVAD Restrictions Weight Bearing Restrictions: No RUE Weight Bearing:  (sternal precautions) LUE Weight Bearing:  (sternal precautions) RLE Weight Bearing: Weight bearing as tolerated Other Position/Activity Restrictions: Sternal Precautions   Therapy/Group: Individual Therapy  Anadalay Macdonell PTA 10/04/2022, 1:09 PM

## 2022-10-04 NOTE — Progress Notes (Signed)
Speech Language Pathology Daily Session Note  Patient Details  Name: Charles Holmes MRN: [id number] Date of Birth: 04-01-1965  Today's Date: 10/04/2022 SLP Individual Time: 5784-6962 SLP Individual Time Calculation (min): 25 min  Short Term Goals: Week 1: SLP Short Term Goal 1 (Week 1): Patient will attend to left field of enviornment during functional tasks with supervision level verbal cues. SLP Short Term Goal 2 (Week 1): Patient will recall sequence to switching to LVAD batteries with Min verbal and visual cues. SLP Short Term Goal 3 (Week 1): Patient will recall reasoning for specific LVAD alarms with Min verbal and visual cues. SLP Short Term Goal 4 (Week 1): Patient will utilize speech intelligibility strategies at the phrase level to achieve ~80% intelligibility with Min verbal cues.  Skilled Therapeutic Interventions: Skilled treatment session focused on cognitive and communication goals. Upon arrival, patient was awake while upright in bed and independently recalled this SLP's name. SLP provided patient with written step by step instructions regarding how to change LVAD power from wall to batteries. Patient read instructions aloud with Min verbal cues needed for use of speech intelligibility strategies to achieve ~80% intelligibility. Patient able to recall steps after a 2 minute delay with Min verbal cues. Patient left upright in bed with alarm on and all needs within reach. Continue with current plan of care.      Pain Pain Assessment Pain Scale: 0-10 Pain Score: 0-No pain  Therapy/Group: Individual Therapy  Malyssa Maris 10/04/2022, 12:08 PM

## 2022-10-04 NOTE — Progress Notes (Addendum)
Patient ID: Charles Holmes, male   DOB: 03-25-1965, 57 y.o.   MRN: [id number]     Advanced Heart Failure Rounding Note  PCP-Cardiologist: Norman Herrlich, MD  [facility] Dr. Gala Romney   Subjective:    Admitted to CIR on 09/11  INR 1.7 today.    Denies SOB.   LVAD Interrogation HM III: Speed: 5400  Flow: 4.6  PI: 2.4 Power: 4  VAD interrogated personally. Parameters stable.  Objective:   Weight Range: 80.5 kg Body mass index is 27.8 kg/m.   Vital Signs:   Temp:  [97.8 F (36.6 C)-99 F (37.2 C)] 97.9 F (36.6 C) (09/18 1253) Pulse Rate:  [58-94] 91 (09/18 1253) Resp:  [16-18] 16 (09/18 1253) BP: (87-142)/(59-107) 142/106 (09/18 1253) SpO2:  [93 %-98 %] 94 % (09/18 1253) Weight:  [80.5 kg] 80.5 kg (09/18 0544) Last BM Date : 10/03/22  Weight change: Filed Weights   10/02/22 0500 10/03/22 0500 10/04/22 0544  Weight: 79.7 kg 79.7 kg 80.5 kg   Intake/Output:   Intake/Output Summary (Last 24 hours) at 10/04/2022 1333 Last data filed at 10/04/2022 1253 Gross per 24 hour  Intake 472 ml  Output 2650 ml  Net -2178 ml   Physical Exam  Physical Exam: GENERAL: No acute distress. In bed.  HEENT: normal  NECK: Supple, JVP  8-9.  2+ bilaterally, no bruits.  No lymphadenopathy or thyromegaly appreciated.   CARDIAC:  Mechanical heart sounds with LVAD hum present.  LUNGS:  Clear to auscultation bilaterally.  ABDOMEN:  Soft, round, nontender, positive bowel sounds x4.     LVAD exit site: well-healed and incorporated.  Dressing dry and intact.  No erythema or drainage.  Stabilization device present and accurately applied.  Driveline dressing is being changed daily per sterile technique. EXTREMITIES:  Warm and dry, no cyanosis, clubbing, rash. R and LLE 1-2+  edema . RUE weaker that Left  NEUROLOGIC:  Alert and oriented x 3.    No aphasia.  No dysarthria.  Affect pleasant.     Labs    CBC Recent Labs    10/02/22 0236  WBC 7.8  NEUTROABS 5.3  HGB 8.7*  HCT 30.7*  MCV 95.9  PLT  235   Basic Metabolic Panel Recent Labs    86/57/84 0500 10/04/22 0507  NA 139 139  K 3.9 3.6  CL 107 104  CO2 28 27  GLUCOSE 79 76  BUN 9 10  CREATININE 0.60* 0.67  CALCIUM 8.3* 8.4*  MG  --  1.6*   BNP: BNP (last 3 results) Recent Labs    09/11/22 2252 09/18/22 2246 09/25/22 2351  BNP 933.0* 809.0* 851.4*   Imaging   No results found.  Medications:     Scheduled Medications:  aspirin EC  81 mg Oral Daily   atorvastatin  80 mg Oral Daily   Chlorhexidine Gluconate Cloth  6 each Topical BID   digoxin  0.125 mg Oral Daily   dorzolamide-timolol  1 drop Both Eyes BID   Gerhardt's butt cream   Topical TID   influenza vac split trivalent PF  0.5 mL Intramuscular Tomorrow-1000   latanoprost  1 drop Both Eyes QHS   melatonin  5 mg Oral QHS   mexiletine  250 mg Oral BID   mometasone-formoterol  2 puff Inhalation BID   multivitamin with minerals  1 tablet Oral Daily   nystatin   Topical BID   pantoprazole  40 mg Oral Daily   sertraline  50 mg Oral Daily  sildenafil  20 mg Oral TID   traZODone  50 mg Oral QHS   umeclidinium bromide  1 puff Inhalation Daily   warfarin  5 mg Oral ONCE-1600   Warfarin - Pharmacist Dosing Inpatient   Does not apply q1600    Infusions:  sodium chloride 250 mL (10/01/22 1934)   ampicillin (OMNIPEN) IV 2 g (10/04/22 1242)   cefTRIAXone (ROCEPHIN)  IV 2 g (10/04/22 0749)   magnesium sulfate bolus IVPB      PRN Medications: sodium chloride, acetaminophen, albuterol, alum & mag hydroxide-simeth, bisacodyl, guaiFENesin-dextromethorphan, ondansetron **OR** ondansetron (ZOFRAN) IV, mouth rinse, polyethylene glycol, sodium chloride, sodium chloride flush, sodium chloride flush, sodium phosphate, traMADol   Patient Profile  Mr Sthill is a 57 y.o. 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 Admitted with cardiogenic shock, stabilized and underwent HM3 LVAD. Post implant  course c/b acute CVA and enterococcus faecalis bacteremia.   Assessment/Plan  1.  Acute on chronic Systolic HF-->Cardiogenic Shock  - Diagnosed 11/2019. Presented with VT. LHC 70% LAD  - cMRI 12/21 concerning for sarcoid and EF 18%.  - PET 2/22 at [facility] EF 25% + active sarcoid - Echo 08/26/20 EF < 20% severely dilated LV RV mildly decreased.  - [employer] CRT-D upgrade in 06/08/21 - Echo 07/10/22: EF <20%, RV okay, mod pericardial effusion, mod MR/TR - Admitted 07/25 with cardiogenic shock. - RHC: Nonobstructive CAD, severely elevated filling pressures and low Fick CO/CI (2.7/1.4) - 08/13 HM III LVAD implant + clipping LAA d/t severe thickening and invagination of mitral valve annulus impeding flows.  - Apical core sent - no mention of sarcoid - Speed increased to 5300 on 08/14.  - Echo 8/26 mod-sev RV dysfunction. Speed increased to 5400 8/26 - RHC 8/27: low filing pressures with excellent cardiac output on EPI, PAPi 4.6 - Ramp Echo 9/4, speed increased to 5400  - MAP 80s  - On Sildenafil 20 mg tid.  -Volume overloaded. Start lasix 40 mg po daily. Add 12.5 mg spironolactone daily.  - Give 40 meq K.  - Continue digoxin 0.125 mg daily  2. HM-3 LVAD - VAD interrogated personally. Parameters stable. - LDH stable.  - Continue ASA 81 - INR 1.7 Continue warfarin.   3.  Acute stroke - Hx CVA 06/24 -Admitted 06/24 w/ R MCA stroke. S/p TPA and mechanical clot extraction. No residual deficits. Likely cardioembolic in setting of severe LV dysfunction. - Developed left sided weakness 08/14. CTA with R MCA infarct. Taken to IR for thrombectomy - Repeat CT head with small to moderate size hemorrhagic conversion.  - repeat head CT on 8/17 w/ improved hemorrhagic CVA - Back on warfarin. - Continue ASA 81 mg daily - Appreciate CIR.  4. Enterococcus faecalis bacteremia - Bcx 2/2 on 9/6 - ID consult 9/6 -> ampicillin and ceftriaxone - TEE recommended but doubt it will change management as VAD  cannot be removed and will require IV abx followed by long-term suppression - Echo 09/10 - no obvious vegetations -  Repeat BCx 9/8 NGTD. - ID recommending 6 weeks IV amp/ceftriaxone followed by long-term suppression with amoxicillin. Stop date 11/04/22  PICC replaced 9/14.  5. Hx VT - ln setting of potential sarcoid heart disease  - Off amio due to tremor. Continue mexiletine  - now s/p ICD.   6. CAD - LHC 12/07/19 70-% LAD, no intervention - LHC 8/24 non obstructive CAD.  - Continue statin. On aspirin  for VAD.  7. 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  - holding methotrexate w/ recent surgery and active infection, can discuss timing of restarting the medication at outpatient follow-up - apical core pathology not diagnostic of cardiac sarcoidosis.   8. 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 AKI - suspect cardiorenal, improved w/ inotropic support - Resolved.    10. Iron deficiency anemia/ Post-op anemia - Has had IV Fe.    11. Pulmonary  - PFTs with severe obstructive defect, response to bronchodilator. FEV1 1.04L, FEV1/FVC 48% - Stable  12. Psych - Depression and delirium post VAD implant. Prolonged hospital course.  - Improving - Continue sertraline  Amy Clegg NP-C  10/04/2022  Agree with the above NP note.   Volume overload today, will add Lasix 40 daily/spironolactone 12.5 daily.  LVAD parameters stable.  INR 1.7, adjust warfarin.   Marca Ancona 10/04/2022

## 2022-10-04 NOTE — Progress Notes (Signed)
PROGRESS NOTE   Subjective/Complaints:  Appreciate CVTS and cards notes   ROS:   Pt denies SOB, abd pain, CP, N/V/C/D, and vision changes  Objective:   No results found. Recent Labs    10/02/22 0236  WBC 7.8  HGB 8.7*  HCT 30.7*  PLT 235    Recent Labs    10/03/22 0500 10/04/22 0507  NA 139 139  K 3.9 3.6  CL 107 104  CO2 28 27  GLUCOSE 79 76  BUN 9 10  CREATININE 0.60* 0.67  CALCIUM 8.3* 8.4*    Intake/Output Summary (Last 24 hours) at 10/04/2022 0931 Last data filed at 10/04/2022 0845 Gross per 24 hour  Intake 354 ml  Output 2950 ml  Net -2596 ml        Physical Exam: Vital Signs Blood pressure (!) 110/90, pulse 60, temperature 98.6 F (37 C), temperature source Oral, resp. rate 18, height 5\' 7"  (1.702 m), weight 80.5 kg, SpO2 94%.   General: No acute distress Mood and affect are appropriate Heart: Regular rate and rhythm no rubs murmurs or extra sounds Lungs: Clear to auscultation, breathing unlabored, no rales or wheezes Abdomen: Positive bowel sounds, soft nontender to palpation, nondistended Extremities: No clubbing, cyanosis, or edema Skin: erythematous well demarcated inguinal fold macular rash    Skin: chest incisions clean healing well , LVAD drive site clean/dressed  PRIOR EXAMS: Neuro:  Alert and oriented x 3. Fair insight and awareness. Intact Memory. Normal language and speech. Cranial nerve exam unremarkable except for mild left central 7. MMT: LUE grossly 3- to 3/5 prox to distal. LLE 4-/5 throughout. Pt with intact  LT sense on left but feels a little different compared to R side . No abnl resting tone. DTR's tr to1+.   Musculoskeletal: Full ROM, No pain with AROM or PROM in the neck, trunk, or extremities. Posture appropriate     Assessment/Plan: 1. Functional deficits which require 3+ hours per day of interdisciplinary therapy in a comprehensive inpatient rehab  setting. Physiatrist is providing close team supervision and 24 hour management of active medical problems listed below. Physiatrist and rehab team continue to assess barriers to discharge/monitor patient progress toward functional and medical goals  Care Tool:  Bathing    Body parts bathed by patient: Chest, Left arm, Abdomen, Front perineal area, Face   Body parts bathed by helper: Buttocks, Right upper leg, Left upper leg, Right lower leg, Left lower leg, Right arm     Bathing assist Assist Level: Maximal Assistance - Patient 24 - 49%     Upper Body Dressing/Undressing Upper body dressing   What is the patient wearing?: Pull over shirt    Upper body assist Assist Level: Maximal Assistance - Patient 25 - 49%    Lower Body Dressing/Undressing Lower body dressing      What is the patient wearing?: Pants     Lower body assist Assist for lower body dressing: Minimal Assistance - Patient > 75%     Toileting Toileting    Toileting assist Assist for toileting: Maximal Assistance - Patient 25 - 49%     Transfers Chair/bed transfer  Transfers assist  Chair/bed transfer activity  did not occur: Safety/medical concerns (fatigue)  Chair/bed transfer assist level: Moderate Assistance - Patient 50 - 74%     Locomotion Ambulation   Ambulation assist   Ambulation activity did not occur: Safety/medical concerns (fatigue)          Walk 10 feet activity   Assist  Walk 10 feet activity did not occur: Safety/medical concerns (fatigue)        Walk 50 feet activity   Assist Walk 50 feet with 2 turns activity did not occur: Safety/medical concerns         Walk 150 feet activity   Assist Walk 150 feet activity did not occur: Safety/medical concerns         Walk 10 feet on uneven surface  activity   Assist Walk 10 feet on uneven surfaces activity did not occur: Safety/medical concerns (fatigue)         Wheelchair     Assist Is the patient  using a wheelchair?: Yes Type of Wheelchair: Manual Wheelchair activity did not occur: Safety/medical concerns (fatigue)         Wheelchair 50 feet with 2 turns activity    Assist    Wheelchair 50 feet with 2 turns activity did not occur: Safety/medical concerns       Wheelchair 150 feet activity     Assist  Wheelchair 150 feet activity did not occur: Safety/medical concerns       Blood pressure (!) 110/90, pulse 60, temperature 98.6 F (37 C), temperature source Oral, resp. rate 18, height 5\' 7"  (1.702 m), weight 80.5 kg, SpO2 94%.  Medical Problem List and Plan: 1. Functional deficits secondary to acute right MCA infarct/ LVAD pt             -patient may shower             -ELOS/Goals: modI/S 10-14 days   -Con't CIR PT, OT and SLP Team conference today please see physician documentation under team conference tab, met with team  to discuss problems,progress, and goals. Formulized individual treatment plan based on medical history, underlying problem and comorbidities.  2.  Antithrombotics: -DVT/anticoagulation:  Pharmaceutical: Coumadin  -9/14- managed by pharmacy- INR 1.8 today  -10/01/22 INR 2.0 today, appreciate pharm support             -antiplatelet therapy: Aspirin 81 mg daily   3. Pain Management: Tylenol and Tramadol as needed   4. Mood/Behavior/Sleep: delirium/insomnia - team to provide ongoing ego support             -continue mirtazapine 7.5 mg q HS--not ordered? Defer to weekday team             -continue sertraline 50 mg daily             -continue melatonin 3 mg q HS -Started Trazodone 50mg  at bedtime on 09/29/22             -antipsychotic agents: n/a -9/13- mood somewhat better than when saw pt Tuesday- con't regimen and monitor -09/30/22 not sleeping well, increased Melatonin to 5mg  QHS; if not helping, may consider restarting mirtazapine as above 9/18 poor sleep related to freq urination related to lasix , intake of fluids is minimal may need to  use comp hose to prvent accumulation during the day and prevent nocturnal diuresis  5. Neuropsych/cognition: This patient is capable of making decisions on his own behalf.   6. Skin/Wound Care: Routine skin care checks   7. Fluids/Electrolytes/Nutrition: strict Is and Os  and follow-up chemistries             -poor PO intake on Marinol             -continue supplements   -9/12 labs reviewed. low albumin still continue interventions above  8: hypotension but difficult to assess due to LVAD    9/13- BP soft, but denies orthostatic hypotension -9/14-15/24 BPs generally stable/improving; was previously on midodrine, but per HF team pt was stable off it;  Difficult to measure BP due to LVAD as well as LUE edema post CVA Vitals:   10/02/22 1407 10/02/22 1627 10/02/22 2029 10/03/22 0144  BP: 93/75 97/78 94/75  (!) 87/77   10/03/22 0541 10/03/22 1023 10/03/22 1351 10/03/22 1405  BP: 112/86 96/70 (!) 89/65 (!) 89/65   10/03/22 1700 10/03/22 2100 10/04/22 0130 10/04/22 0530  BP: (!) 87/59 (!) 121/107 96/83 (!) 110/90     9: Hyperlipidemia: continue atorvastatin 80mg  daily   10: Enterococcus bacteremia: 6 weeks IV antibiotic, ceftriaxone and ampicillin last dose 10/19 will go home with PICC and HH             -follow-up with ID  -09/30/22 PICC placed this AM  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: Home meds: Coreg 3.125, Lasix 40mg , Lostartan 25mg , Aldactone 12.5, Farxiga 10mg , mexilitene --holding all currently   12: Acute on chronic systolic heart failure with cardiogenic shock s/p LVAD             -daily weights balanced so far             -continue digoxin 0.125 mg daily             -continue mexiletine 250 mg BID -continue midodrine 5 mg q 8 hours--not ordered, per HF team on 9/13 pt was stable off Midodrine; hold for now             -continue sildenafil 20 mg q 8 hours             -continue warfarin per pharmacy   Managed by Heart Failure team - coordinating with primary service for activity  tolerance monitoring  Filed Weights   10/02/22 0500 10/03/22 0500 10/04/22 0544  Weight: 79.7 kg 79.7 kg 80.5 kg    13: Hx of VT: continue mexiletine, digoxin; has ICD   14: DM-2: A1c = 5.5%; monitor PO intake; DC SSI (Farxiga at home)   15: OSA: needs outpatient sleep study   16: Anemia, iron deficiency: follow-up CBC--most recent hgb 9.5--lab pending still today?  -    Latest Ref Rng & Units 10/02/2022    2:36 AM 10/01/2022    3:39 AM 09/27/2022    2:26 AM  CBC  WBC 4.0 - 10.5 K/uL 7.8  8.2  9.7   Hemoglobin 13.0 - 17.0 g/dL 8.7  8.9  9.5   Hematocrit 39.0 - 52.0 % 30.7  31.2  32.6   Platelets 150 - 400 K/uL 235  251  307       17: Severe COPD:              -continue Ellipta one puff daily   18: S/p tooth extractions this admission: monitor PO tolerance   19: Glaucoma: continue Cosopt and Xalatan   20.  Candidiasis in groin start nystatin powder  LOS: 7 days A FACE TO FACE EVALUATION WAS PERFORMED  Erick Colace 10/04/2022, 9:31 AM

## 2022-10-04 NOTE — Progress Notes (Signed)
Occupational Therapy Session Note  Patient Details  Name: Charles Holmes MRN: [id number] Date of Birth: June 22, 1965  Today's Date: 10/04/2022 OT Individual Time: 4098-1191 session 1 OT Individual Time Calculation (min): 73 min  Session 2: 4782-9562   Short Term Goals: Week 1:  OT Short Term Goal 1 (Week 1): Pt will complete toilet transfers min A using LRAD OT Short Term Goal 2 (Week 1): Pt will recall hemi-dressing techniques with min verbal cues OT Short Term Goal 3 (Week 1): Pt will maintain sternal precautions during BADLs and functional transfers at 95% safe with min verbal cues OT Short Term Goal 4 (Week 1): Pt will maintain dynamic standing balance using LRAD during functional task >1 minute to demonstrate increased activity tolerance  Skilled Therapeutic Interventions/Progress Updates:   Session 1: pt greeted in bed eating breakfast, pt agreeable to OT intervention. Changed pt over to batteries with total A. Pt completed supine>sit with CGA. Total A to don socks for time mgmt. Pt completed sit>stand from EOB with no AD and MINA. Pt completed ambulatory transfer to toilet while holding IV with MINA. Pt with continent bowel void needing MAX A for 3/3 toileting tasks. Pt reports his caregiver can assist with this at home. Pt exited bathroom with MIN A holding IV pole.   Pt sat at sink for bathing tasks completing face washing only per pt request. Washed pts hair to provide self care with total A. Pt unable to access head with LUE at this time allow pt did attempt 2x.  Transported pt to dayroom with total A in w/c, Worked on LUE motor planning with various functional reaching tasks with pt needing MIN support at L elbow for proximal support. Reahing activities seem to greatly fatigue pt.   Pt transported back to room with total A with pt able to complete stand pivot back to bed with MIN HHA. Pt needed MODA to elevate BLEs back to bed. Transferred pt back to wall hook up with total A. Ended  session with pt supine in bed, LUE supported and all needs within reach. Bed alarm activated.   Session 2: pt greeted seated in w/c, pt agreeable to OT intervention. Pt transported to gym with total A. Session focused on LUE NMR tasks to facilitate improved AROM and motor planning. Pt completed a series of tasks such as grasping wash cloth and reaching towards various UB parts to simulate bathing with MIN  support at L elbow. Graded task up and had pt reach for bean bags to increase grip strength. Pt needed education on compensatory methods for functional grasp such as sliding bag to end of table and then adduct thumb to fully grasp. Pt transported back to room with total A with pt needing MOD A to stand from w/c ( more assist than usual) and MIN A to pivot back to bed with no AD. Pt required MODA to elevate BLE back to bed. Place pt back on wall hook up for LVAD with total A. Ended session with pt supine in bed with bed alarm activated and all needs within reach.  Therapy Documentation Precautions:  Precautions Precautions: Sternal, Fall, Other (comment) Precaution Comments: LVAD Restrictions Weight Bearing Restrictions: No RUE Weight Bearing:  (sternal precautions) LUE Weight Bearing:  (sternal precautions) RLE Weight Bearing: Weight bearing as tolerated Other Position/Activity Restrictions: Sternal Precautions  Pain:  Session 1: no pain  Session 2: no pain     Therapy/Group: Individual Therapy  Pollyann Glen [facility] 10/04/2022, 10:12 AM

## 2022-10-04 NOTE — Progress Notes (Signed)
LVAD Coordinator Rounding Note:  Admitted 08/10/22 due to acute on chronic CHF with cardiogenic shock. Milrinone dependent. Advance therapy workup completed, and pt deemed acceptable VAD candidate. Dental extractions completed 8/6. IABP placed 08/25/22.  HM 3 LVAD implanted on 08/29/22 by Dr Donata Clay under destination therapy criteria. 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 Result negative.  7/25 Admit with cardiogenic shock. Started milrinone and NE. 8/6 S/P 13 teeth extractions  8/9 IABP placed 8/13 S/p HM III LVAD implant + clipping left atrial appendage d/t severe thickening and invagination of mitral valve annulus impeding flows  CT Head 8/14 (initial) 1. 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 ASPECTS is 7. 2. No acute hemorrhage.  CT Angio Head/Neck 1. 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 2. Core infarct of 12 cc (somewhat underestimated compared to aspects) with 90 cc of penumbra. 3. Mild atherosclerosis.  Pt taken emergently to IR for percutaneous right common carotid arteriogram with thrombectomy. Revascularization achieved. Angio-seal closure device applied to left groin- clean, dry, and intact.   CT Head 8/15 @ 0450 Unchanged extent of infarct and hemorrhage in the right MCA distribution including small volume intraventricular clot. No hydrocephalus.  CT Head 8/17 @ 0701 Interval evolution of the right MCA territory infarct with decreased intraparenchymal and intraventricular hemorrhage. No hydrocephalus or midline shift.   Pt lying in bed this morning in good spirits. States he is tired and would like to rest before his next therapy session.   Blood cultures from 9/6 positive for Enterococcus Faecalis. Subsequent blood cultures NGTD. ID following recommending 6 weeks of IV Ampicillin and Ceftriaxone followed by chronic suppression with Amoxicillin.   VAD coordinator/nurses working with pt on  changing power sources. He is unable to do this due to his left sided weakness. Modification of his power cords was made in hopes he can grip the cords better. Has practice power cords for use by pt. See below for education documentation. He is making progress with therapy.  Pt may not shower at this time.   Vital signs: Temp: 98.9 HR: 91 Doppler Pressure: 81 Automatic BP: 96/81 (89) O2 Sat: 94% on RA Wt: 183.6>191.1>190.9>190.2>184.8>...>163.1>162.5>171.3>172.4>176.6> 180.6>175.1>174.8>176.3>175.7>177.5 lbs   LVAD interrogation reveals:  Speed: 5400 Flow: 4.8 Power: 4.0 w PI: 2.6  Alarms: none Events: none  Hematocrit: 30 Fixed speed: 5400 Low speed limit: 5100  Drive Line: CDI. Drive line anchor secure. Continue twice a week dressing changes on Monday/Thursday. Next dressing change due 10/05/22 by VAD coordinator or nurse champion only.   Labs:  LDH trend: 596>441>165>384>345>312>312>350>327>309>316>305>298>277>273>298>280>285>251>252>205  INR trend: 1.4>1.6>1.1>1.1>1.3>1.2>1>1.3>1.4>1.5>1.6>2.8>2.8>2.3>2.2>2.3>2.6>2.6>1.7>2>1.8>1.7  AST/ALT trend: 218/45>96/30>73/23>63/36>58/37>69/50>93/72>123/103>143/150>117/138>119/138>92/127>87/123>50/71>46/65>46/43  Total Bili trend: 4.4>4.6>5.1>1.8>1.9>1.2>1.0>1.5>1.1>0.8>0.9>1.0>0.8  WBC trend: 11.6>13.7>19.6>16.1>23>26.6>21>24.6>18.1>16.5>13.2>13.1>12.5>11.6>10.5>10.8>13.1>9.5>9.1>9.7>7.8  Anticoagulation Plan: -INR Goal: 2.0 - 2.5 -ASA Dose: 81 mg  - Coumadin dosing per pharmacy  Blood Products:  IntraOp 8/14: - 4 FFP - 2 Platelets - 2 PRBC - 1 cyro - 449 cc of cellsaver - DDAVP 20 mcg x 1   Device: Medtronic BiV -Therapies: ON VF ON >207 BPM FVT ON 176-207 BPM VT ON 171-207 BPM  Arrythmias:   Respiratory:   Infection:  09/01/22>> sputum cx>> NGTD>>final 09/01/22>> blood cx>> NGTD>>final 09/01/22>> urine cx>> NGTD>> final 09/13/22>>blood cxs>>no growth 5 days; final 09/22/22>>blood cxs>> Enterococcus faecalis   09/23/22>> blood cxs>> neg final 09/24/22>> blood cxs>> neg final  Renal:  9/12: BUN/CRT: 8/0.62  Adverse Events on VAD: - 08/30/22: - Developed left sided weakness this am. CTA with R MCA infarct. Taken  to IR for thrombectomy   Drips:    Patient Education: Discharge teaching completed with pt's [name] on 9/5. See separate note for documentation.   Plan/Recommendations:  Please page VAD coordinator for any alarms or VAD equipment issues. Continue twice a week dressing changes on Monday/Thursday by VAD coordinator, Nurse Alla Feeling or [place].   [name] RN VAD Coordinator  Office: [phone]  24/7 Pager: [phone]

## 2022-10-04 NOTE — Progress Notes (Signed)
Patient ID: Charles Holmes, male   DOB: Jan 09, 1966, 57 y.o.   MRN: [id number]  Sw made attempt to meet and assess patient. Patient using the bathroom. Sw will FU.

## 2022-10-04 NOTE — Telephone Encounter (Signed)
Per phase 1 cardiac rehab fax referral to [facility]

## 2022-10-04 NOTE — Progress Notes (Signed)
ANTICOAGULATION CONSULT NOTE - Follow-up  Pharmacy Consult for warfarin Indication:  LVAD  Allergies  Allergen Reactions   Pacerone [Amiodarone] Other (See Comments)    Severe tremors   Percocet [Oxycodone-Acetaminophen] Itching    Patient Measurements: Height: 5\' 7"  (170.2 cm) Weight: 80.5 kg (177 lb 7.5 oz) IBW/kg (Calculated) : 66.1 Heparin Dosing Weight: 87kg  Vital Signs: Temp: 98.6 F (37 C) (09/18 0530) Temp Source: Oral (09/18 0530) BP: 110/90 (09/18 0530) Pulse Rate: 60 (09/18 0530)  Labs: Recent Labs    10/02/22 0236 10/03/22 0500 10/04/22 0507  HGB 8.7*  --   --   HCT 30.7*  --   --   PLT 235  --   --   LABPROT 22.6* 20.7* 19.9*  INR 2.0* 1.8* 1.7*  CREATININE 0.80 0.60* 0.67    Estimated Creatinine Clearance: 101.1 mL/min (by C-G formula based on SCr of 0.67 mg/dL).   Medical History: Past Medical History:  Diagnosis Date   CAD (coronary artery disease)    CHF (congestive heart failure) (HCC)    COPD (chronic obstructive pulmonary disease) (HCC)    GERD (gastroesophageal reflux disease)    Hyperlipidemia    Hypertension    LVAD (left ventricular assist device) present (HCC)    Stroke (HCC)    Systolic heart failure (HCC) 2021   LVEF 18%, RVEF 38% on cardiac MRI 12/19/2019. possible cardiac sarcoidosis.   Wide-complex tachycardia 2021   wears LifeVest     Assessment: Charles Holmes on apixaban PTA for hx AF admitted for LVAD workup. Pt s/p HM3 implant on 8/13 c/b acute CVA postop. Started low dose heparin + warfarin started 8/19. Low-dose heparin was stopped 8/31.  INR subtherapeutic at 1.7, CBC remains stable. No s/sx of bleeding. Oral intake of scheduled meals is improving. Dose of 5 mg given yesterday.  Goal of Therapy:  INR 2-2.5 Monitor platelets by anticoagulation protocol: Yes   Plan:  Warfarin 5 mg tonight Stop meal-replacement shakes. Monitor daily INR, CBC  Thank you for allowing pharmacy to participate in this patient's  care,  Wilmer Floor, PharmD PGY2 Cardiology Pharmacy Resident Please see AMION for all Pharmacists' Contact Phone Numbers 10/04/2022, 8:39 AM

## 2022-10-05 ENCOUNTER — Encounter (HOSPITAL_COMMUNITY): Payer: Self-pay | Admitting: Cardiothoracic Surgery

## 2022-10-05 DIAGNOSIS — Z95811 Presence of heart assist device: Secondary | ICD-10-CM

## 2022-10-05 LAB — BASIC METABOLIC PANEL
Anion gap: 5 (ref 5–15)
BUN: 7 mg/dL (ref 6–20)
CO2: 31 mmol/L (ref 22–32)
Calcium: 8.1 mg/dL — ABNORMAL LOW (ref 8.9–10.3)
Chloride: 101 mmol/L (ref 98–111)
Creatinine, Ser: 0.62 mg/dL (ref 0.61–1.24)
GFR, Estimated: >60 mL/min (ref >60)
Glucose, Bld: 83 mg/dL (ref 70–99)
Potassium: 3.7 mmol/L (ref 3.5–5.1)
Sodium: 137 mmol/L (ref 135–145)

## 2022-10-05 LAB — URINALYSIS, W/ REFLEX TO CULTURE (INFECTION SUSPECTED)
Bacteria, UA: NONE SEEN
Bilirubin Urine: NEGATIVE
Glucose, UA: NEGATIVE mg/dL
Hgb urine dipstick: NEGATIVE
Ketones, ur: NEGATIVE mg/dL
Leukocytes,Ua: NEGATIVE
Nitrite: NEGATIVE
Protein, ur: NEGATIVE mg/dL
Specific Gravity, Urine: 1.013 (ref 1.005–1.030)
pH: 8 (ref 5.0–8.0)

## 2022-10-05 LAB — PROTIME-INR
INR: 1.7 — ABNORMAL HIGH (ref 0.8–1.2)
Prothrombin Time: 20.3 seconds — ABNORMAL HIGH (ref 11.4–15.2)

## 2022-10-05 LAB — MAGNESIUM: Magnesium: 2.1 mg/dL (ref 1.7–2.4)

## 2022-10-05 MED ORDER — SPIRONOLACTONE 25 MG PO TABS
25.0000 mg | ORAL_TABLET | Freq: Every day | ORAL | Status: DC
  Administered 2022-10-06 – 2022-10-17 (×12): 25 mg via ORAL
  Filled 2022-10-05 (×12): qty 1

## 2022-10-05 MED ORDER — POTASSIUM CHLORIDE CRYS ER 20 MEQ PO TBCR
40.0000 meq | EXTENDED_RELEASE_TABLET | Freq: Once | ORAL | Status: AC
  Administered 2022-10-05: 40 meq via ORAL
  Filled 2022-10-05: qty 2

## 2022-10-05 NOTE — Progress Notes (Signed)
LVAD Coordinator Rounding Note:  Admitted 08/10/22 due to acute on chronic CHF with cardiogenic shock. Milrinone dependent. Advance therapy workup completed, and pt deemed acceptable VAD candidate. Dental extractions completed 8/6. IABP placed 08/25/22.  HM 3 LVAD implanted on 08/29/22 by Dr Donata Clay under destination therapy criteria. 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 Result negative.  7/25 Admit with cardiogenic shock. Started milrinone and NE. 8/6 S/P 13 teeth extractions  8/9 IABP placed 8/13 S/p HM III LVAD implant + clipping left atrial appendage d/t severe thickening and invagination of mitral valve annulus impeding flows  CT Head 8/14 (initial) 1. 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 ASPECTS is 7. 2. No acute hemorrhage.  CT Angio Head/Neck 1. 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 2. Core infarct of 12 cc (somewhat underestimated compared to aspects) with 90 cc of penumbra. 3. Mild atherosclerosis.  Pt taken emergently to IR for percutaneous right common carotid arteriogram with thrombectomy. Revascularization achieved. Angio-seal closure device applied to left groin- clean, dry, and intact.   CT Head 8/15 @ 0450 Unchanged extent of infarct and hemorrhage in the right MCA distribution including small volume intraventricular clot. No hydrocephalus.  CT Head 8/17 @ 0701 Interval evolution of the right MCA territory infarct with decreased intraparenchymal and intraventricular hemorrhage. No hydrocephalus or midline shift.   Pt lying in bed this morning in good spirits. He is making progress. PT in the room. PT states pt tentative d/c date is October 1.  Blood cultures from 9/6 positive for Enterococcus Faecalis. Subsequent blood cultures NGTD. ID following recommending 6 weeks of IV Ampicillin and Ceftriaxone followed by chronic suppression with Amoxicillin.   VAD coordinator/nurses working with pt  on changing power sources. He is unable to do this due to his left sided weakness. Modification of his power cords was made in hopes he can grip the cords better. Has practice power cords for use by pt. See below for education documentation. He is making progress with therapy.  Pt may not shower at this time.   Vital signs: Temp: 98.8 HR: 91 Doppler Pressure: 76 Automatic BP: 99/77 (84) O2 Sat: 93% on RA Wt: 183.6>191.1>190.9>190.2>184.8>...>163.1>162.5>171.3>172.4>176.6> 180.6>175.1>174.8>176.3>175.7>177.5>173 lbs   LVAD interrogation reveals:  Speed: 5400 Flow: 4.7 Power: 4.0 w PI: 2.9  Alarms: none Events: 1 PI today  Hematocrit: 30 Fixed speed: 5400 Low speed limit: 5100  Drive Line: Existing VAD dressing removed and site care performed using sterile technique. Drive line exit site cleaned with Chlora prep applicators x 2, rinsed with saline, allowed to dry, and Vashe moistened 2x2 placed around driveline then covered with dry 4x4. Exit site healing and unincorporated, the velour is fully implanted at exit site. Scant amount of serous drainage.Slight redness, no tenderness, foul odor or rash noted. Drive line anchor re-applied. Continue twice a week dressing changes on Monday/Thursday. Next dressing change due 10/09/22 by VAD coordinator or nurse champion only or [place].       Labs:  LDH trend: 596>441>165>384>345>312>312>350>327>309>316>305>298>277>273>298>280>285>251>252>205  INR trend: 1.4>1.6>1.1>1.1>1.3>1.2>1>1.3>1.4>1.5>1.6>2.8>2.8>2.3>2.2>2.3>2.6>2.6>1.7>2>1.8>1.7  AST/ALT trend: 218/45>96/30>73/23>63/36>58/37>69/50>93/72>123/103>143/150>117/138>119/138>92/127>87/123>50/71>46/65>46/43  Total Bili trend: 4.4>4.6>5.1>1.8>1.9>1.2>1.0>1.5>1.1>0.8>0.9>1.0>0.8  WBC trend: 11.6>13.7>19.6>16.1>23>26.6>21>24.6>18.1>16.5>13.2>13.1>12.5>11.6>10.5>10.8>13.1>9.5>9.1>9.7>7.8  Anticoagulation Plan: -INR Goal: 2.0 - 2.5 -ASA Dose: 81 mg  - Coumadin dosing per  pharmacy  Blood Products:  IntraOp 8/14: - 4 FFP - 2 Platelets - 2 PRBC - 1 cyro - 449 cc of cellsaver - DDAVP 20 mcg x 1   Device: Medtronic BiV -Therapies: ON VF ON >207 BPM FVT ON  176-207 BPM VT ON 171-207 BPM  Arrythmias:   Respiratory:   Infection:  09/01/22>> sputum cx>> NGTD>>final 09/01/22>> blood cx>> NGTD>>final 09/01/22>> urine cx>> NGTD>> final 09/13/22>>blood cxs>>no growth 5 days; final 09/22/22>>blood cxs>> Enterococcus faecalis  09/23/22>> blood cxs>> neg final 09/24/22>> blood cxs>> neg final  Renal:  9/12: BUN/CRT: 8/0.62  Adverse Events on VAD: - 08/30/22: - Developed left sided weakness this am. CTA with R MCA infarct. Taken to IR for thrombectomy    Patient Education: Discharge teaching completed with pt's [name] on 9/5. See separate note for documentation.   Plan/Recommendations:  Please page VAD coordinator for any alarms or VAD equipment issues. Continue twice a week dressing changes on Monday/Thursday by VAD coordinator, Nurse Alla Feeling or [place].   Carlton Adam RN VAD Coordinator  Office: [phone]  24/7 Pager: [phone]

## 2022-10-05 NOTE — Progress Notes (Signed)
PROGRESS NOTE   Subjective/Complaints:  Appreciate CVTS and cards notes , breathing is ok Discussed NMES safety if limited to below the left elbow   ROS:   Pt denies SOB, abd pain, CP, N/V/C/D, and vision changes  Objective:   No results found. No results for input(s): "WBC", "HGB", "HCT", "PLT" in the last 72 hours.   Recent Labs    10/04/22 0507 10/05/22 0307  NA 139 137  K 3.6 3.7  CL 104 101  CO2 27 31  GLUCOSE 76 83  BUN 10 7  CREATININE 0.67 0.62  CALCIUM 8.4* 8.1*    Intake/Output Summary (Last 24 hours) at 10/05/2022 0901 Last data filed at 10/05/2022 0818 Gross per 24 hour  Intake 576 ml  Output 1825 ml  Net -1249 ml        Physical Exam: Vital Signs Blood pressure 99/77, pulse 86, temperature 98.8 F (37.1 C), temperature source Oral, resp. rate 18, height 5\' 7"  (1.702 m), weight 78.5 kg, SpO2 93%.   General: No acute distress Mood and affect are appropriate Heart: Regular rate and rhythm no rubs murmurs or extra sounds Lungs: Clear to auscultation, breathing unlabored, no rales or wheezes Abdomen: Positive bowel sounds, soft nontender to palpation, nondistended Extremities: No clubbing, cyanosis, or edema Skin: erythematous well demarcated inguinal fold macular rash    Skin: chest incisions clean healing well , LVAD drive site clean/dressed  PRIOR EXAMS: Neuro:  Alert and oriented x 3. Fair insight and awareness. Intact Memory. Normal language and speech. Cranial nerve exam unremarkable except for mild left central 7. MMT: LUE delt, bi, tri, grip   3/5 prox to distal. LLE 4-/5 throughout. Pt with intact  LT sense on left but feels a little different compared to R side . No abnl resting tone. DTR's tr to1+.   Musculoskeletal: Full ROM, No pain with AROM or PROM in the neck, trunk, or extremities. Posture appropriate     Assessment/Plan: 1. Functional deficits which require 3+ hours per  day of interdisciplinary therapy in a comprehensive inpatient rehab setting. Physiatrist is providing close team supervision and 24 hour management of active medical problems listed below. Physiatrist and rehab team continue to assess barriers to discharge/monitor patient progress toward functional and medical goals  Care Tool:  Bathing    Body parts bathed by patient: Chest, Left arm, Abdomen, Front perineal area, Face   Body parts bathed by helper: Buttocks, Right upper leg, Left upper leg, Right lower leg, Left lower leg, Right arm     Bathing assist Assist Level: Maximal Assistance - Patient 24 - 49%     Upper Body Dressing/Undressing Upper body dressing   What is the patient wearing?: Pull over shirt    Upper body assist Assist Level: Maximal Assistance - Patient 25 - 49%    Lower Body Dressing/Undressing Lower body dressing      What is the patient wearing?: Pants     Lower body assist Assist for lower body dressing: Minimal Assistance - Patient > 75%     Toileting Toileting    Toileting assist Assist for toileting: Maximal Assistance - Patient 25 - 49%     Transfers  Chair/bed transfer  Transfers assist  Chair/bed transfer activity did not occur: Safety/medical concerns (fatigue)  Chair/bed transfer assist level: Moderate Assistance - Patient 50 - 74%     Locomotion Ambulation   Ambulation assist   Ambulation activity did not occur: Safety/medical concerns (fatigue)          Walk 10 feet activity   Assist  Walk 10 feet activity did not occur: Safety/medical concerns (fatigue)        Walk 50 feet activity   Assist Walk 50 feet with 2 turns activity did not occur: Safety/medical concerns         Walk 150 feet activity   Assist Walk 150 feet activity did not occur: Safety/medical concerns         Walk 10 feet on uneven surface  activity   Assist Walk 10 feet on uneven surfaces activity did not occur: Safety/medical concerns  (fatigue)         Wheelchair     Assist Is the patient using a wheelchair?: Yes Type of Wheelchair: Manual Wheelchair activity did not occur: Safety/medical concerns (fatigue)         Wheelchair 50 feet with 2 turns activity    Assist    Wheelchair 50 feet with 2 turns activity did not occur: Safety/medical concerns       Wheelchair 150 feet activity     Assist  Wheelchair 150 feet activity did not occur: Safety/medical concerns       Blood pressure 99/77, pulse 86, temperature 98.8 F (37.1 C), temperature source Oral, resp. rate 18, height 5\' 7"  (1.702 m), weight 78.5 kg, SpO2 93%.  Medical Problem List and Plan: 1. Functional deficits secondary to acute right MCA infarct/ LVAD pt             -patient may shower             -ELOS/Goals: modI/S 10/17/22 days   -Con't CIR PT, OT and SLP   2.  Antithrombotics: -DVT/anticoagulation:  Pharmaceutical: Coumadin  -9/14- managed by pharmacy- INR 1.8 today  -10/01/22 INR 2.0 today, appreciate pharm support             -antiplatelet therapy: Aspirin 81 mg daily   3. Pain Management: Tylenol and Tramadol as needed   4. Mood/Behavior/Sleep: delirium/insomnia - team to provide ongoing ego support             -continue mirtazapine 7.5 mg q HS--not ordered? Defer to weekday team             -continue sertraline 50 mg daily             -continue melatonin 3 mg q HS -Started Trazodone 50mg  at bedtime on 09/29/22             -antipsychotic agents: n/a -9/13- mood somewhat better than when saw pt Tuesday- con't regimen and monitor -09/30/22 not sleeping well, increased Melatonin to 5mg  QHS; if not helping, may consider restarting mirtazapine as above 9/18 poor sleep related to freq urination related to lasix , intake of fluids is minimal may need to use comp hose to prvent accumulation during the day and prevent nocturnal diuresis  5. Neuropsych/cognition: This patient is capable of making decisions on his own behalf.    6. Skin/Wound Care: Routine skin care checks   7. Fluids/Electrolytes/Nutrition: strict Is and Os and follow-up chemistries             -poor PO intake on Marinol             -  continue supplements   -9/12 labs reviewed. low albumin still continue interventions above  8: hypotension but difficult to assess due to LVAD    9/13- BP soft, but denies orthostatic hypotension -9/14-15/24 BPs generally stable/improving; was previously on midodrine, but per HF team pt was stable off it;  Difficult to measure BP due to LVAD as well as LUE edema post CVA Vitals:   10/03/22 1405 10/03/22 1700 10/03/22 2100 10/04/22 0130  BP: (!) 89/65 (!) 87/59 (!) 121/107 96/83   10/04/22 0530 10/04/22 0900 10/04/22 1253 10/04/22 1930  BP: (!) 110/90 96/81 (!) 142/106 (!) 88/74   10/04/22 2130 10/05/22 0130 10/05/22 0530 10/05/22 0800  BP: 106/76 106/70 101/73 99/77     9: Hyperlipidemia: continue atorvastatin 80mg  daily   10: Enterococcus bacteremia: 6 weeks IV antibiotic, ceftriaxone and ampicillin last dose 10/19 will go home with PICC and HH             -follow-up with ID  -09/30/22 PICC placed this AM  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: Home meds: Coreg 3.125, Lasix 40mg , Lostartan 25mg , Aldactone 12.5, Farxiga 10mg , mexilitene --holding all currently   12: Acute on chronic systolic heart failure with cardiogenic shock s/p LVAD             -daily weights balanced so far             -continue digoxin 0.125 mg daily             -continue mexiletine 250 mg BID -continue midodrine 5 mg q 8 hours--not ordered, per HF team on 9/13 pt was stable off Midodrine; hold for now             -continue sildenafil 20 mg q 8 hours             -continue warfarin per pharmacy   Managed by Heart Failure team - coordinating with primary service for activity tolerance monitoring  Filed Weights   10/03/22 0500 10/04/22 0544 10/05/22 0548  Weight: 79.7 kg 80.5 kg 78.5 kg    13: Hx of VT: continue mexiletine, digoxin; has ICD    14: DM-2: A1c = 5.5%; monitor PO intake; DC SSI (Farxiga at home)   15: OSA: needs outpatient sleep study   16: Anemia, iron deficiency: follow-up CBC--most recent hgb 9.5--lab pending still today?  -    Latest Ref Rng & Units 10/02/2022    2:36 AM 10/01/2022    3:39 AM 09/27/2022    2:26 AM  CBC  WBC 4.0 - 10.5 K/uL 7.8  8.2  9.7   Hemoglobin 13.0 - 17.0 g/dL 8.7  8.9  9.5   Hematocrit 39.0 - 52.0 % 30.7  31.2  32.6   Platelets 150 - 400 K/uL 235  251  307       17: Severe COPD:              -continue Ellipta one puff daily   18: S/p tooth extractions this admission: monitor PO tolerance   19: Glaucoma: continue Cosopt and Xalatan   20.  Candidiasis in groin start nystatin powder  LOS: 8 days A FACE TO FACE EVALUATION WAS PERFORMED  Erick Colace 10/05/2022, 9:01 AM

## 2022-10-05 NOTE — Progress Notes (Signed)
Speech Language Pathology Weekly Progress Note  Patient Details  Name: Charles Holmes MRN: [id number] Date of Birth: 1965/05/05  Beginning of progress report period: September 28, 2022 End of progress report period: October 05, 2022   Short Term Goals: Week 1: SLP Short Term Goal 1 (Week 1): Patient will attend to left field of enviornment during functional tasks with supervision level verbal cues. SLP Short Term Goal 1 - Progress (Week 1): Met SLP Short Term Goal 2 (Week 1): Patient will recall sequence to switching to LVAD batteries with Min verbal and visual cues. SLP Short Term Goal 2 - Progress (Week 1): Not met SLP Short Term Goal 3 (Week 1): Patient will recall reasoning for specific LVAD alarms with Min verbal and visual cues. SLP Short Term Goal 3 - Progress (Week 1): Not met SLP Short Term Goal 4 (Week 1): Patient will utilize speech intelligibility strategies at the phrase level to achieve ~80% intelligibility with Min verbal cues. SLP Short Term Goal 4 - Progress (Week 1): Met    New Short Term Goals: Week 2: SLP Short Term Goal 1 (Week 2): Patient will attend to left field of enviornment during functional tasks with Mod I SLP Short Term Goal 2 (Week 2): Patient will utilize speech intelligibility strategies at the sentence level to achieve ~80% intelligibility with Min verbal cues. SLP Short Term Goal 3 (Week 2): Patient will recall reasoning for specific LVAD alarms with Min verbal and visual cues. SLP Short Term Goal 4 (Week 2): Patient will recall sequence to switching to LVAD batteries with Min verbal and visual cues.  Weekly Progress Updates: Patient has made functional gains and has met 2 of 4 STGs this reporting period. Currently, patient requires overall Min verbal cues for use of speech intelligibility at the phrase level to achieve ~80% intelligibility at the phrase level. Overall, patient has been demonstrating improved arousal and engagement in therapy but  continues to require Mod-Max A verbal and visual cues for recall of pertinent information as it relates to his LVAD due to decreased recall. Patient and family education ongoing. Patient would benefit from continued skilled SLP intervention to maximize his cognitive functioning and functional communication prior to discharge.     Intensity: Minumum of 1-2 x/day, 30 to 90 minutes Frequency: 1 to 3 out of 7 days Duration/Length of Stay: 10/1 Treatment/Interventions: Cognitive remediation/compensation;Cueing hierarchy;Environmental controls;Functional tasks;Internal/external aids;Patient/family education;Speech/Language facilitation     [name] 10/05/2022, 6:17 AM

## 2022-10-05 NOTE — Progress Notes (Signed)
Status post [facility] implant August 29 2022 Subjective: Patient doing well with minimal complaints other than being disturbed at night and not getting enough rest.  His left arm and hand have become increasingly edematous over the past few days without associated tenderness or erythema.  Will check left arm venous duplex.  INR is 1.7.  He complains of burning with urination.  He is on IV antibiotics including Rocephin and ampicillin.  Will recheck UA and culture.  Objective: Vital signs in last 24 hours: Temp:  [98.2 F (36.8 C)-98.8 F (37.1 C)] 98.8 F (37.1 C) (09/19 0530) Pulse Rate:  [86-90] 86 (09/19 0530) Resp:  [16-18] 18 (09/19 0530) BP: (88-106)/(70-77) 99/77 (09/19 0800) SpO2:  [93 %-98 %] 93 % (09/19 0530) Weight:  [78.5 kg] 78.5 kg (09/19 0548)  Hemodynamic parameters for last 24 hours:  Sinus rhythm VAD parameters stable and satisfactory  Intake/Output from previous day: 09/18 0701 - 09/19 0700 In: 354 [P.O.:354] Out: 2025 [Urine:2025] Intake/Output this shift: Total I/O In: 340 [P.O.:340] Out: 400 [Urine:400]  Exam  Comfortable supine in bed after lunch Lungs clear Sternal incision clean and dry Normal VAD Left hand and arm with soft edema.  He probably had a PICC line in his left arm previously. Left-sided weakness of arm and leg slowly improving Lab Results: No results for input(s): "WBC", "HGB", "HCT", "PLT" in the last 72 hours. BMET:  Recent Labs    10/04/22 0507 10/05/22 0307  NA 139 137  K 3.6 3.7  CL 104 101  CO2 27 31  GLUCOSE 76 83  BUN 10 7  CREATININE 0.67 0.62  CALCIUM 8.4* 8.1*    PT/INR:  Recent Labs    10/05/22 0307  LABPROT 20.3*  INR 1.7*   ABG    Component Value Date/Time   PHART 7.365 08/31/2022 1504   HCO3 33.2 (H) 09/12/2022 1108   HCO3 31.2 (H) 09/12/2022 1108   TCO2 35 (H) 09/12/2022 1108   TCO2 33 (H) 09/12/2022 1108   ACIDBASEDEF 4.0 (H) 08/31/2022 1504   O2SAT 66.9 09/16/2022 0539   CBG (last 3)   No results for input(s): "GLUCAP" in the last 72 hours.  Assessment/Plan: S/P HeartMate 3 implantation 8-13 Appreciate care provided by CIR Expected date of discharge October 1 IV antibiotics to continue to October 19 Check duplex ultrasound of left subclavian vein for left upper extremity swelling Recheck UA for symptoms of dysuria  LOS: 8 days    Lovett Sox 10/05/2022

## 2022-10-05 NOTE — Progress Notes (Signed)
Orthopedic Tech Progress Note Patient Details:  DECARI KOPPLIN Feb 21, 1965 [id number]  Called in order to [employer] for a [employer]   Patient ID: LADARRIS IHA, male   DOB: July 23, 1965, 57 y.o.   MRN: [id number]  [name] 10/05/2022, 5:31 AM

## 2022-10-05 NOTE — Progress Notes (Signed)
Occupational Therapy Weekly Progress Note  Patient Details  Name: Charles Holmes MRN: [id number] Date of Birth: [birth year]  Beginning of progress report period: September 28, 2022 End of progress report period: October 05, 2022  Today's Date: 10/05/2022 OT Individual Time: 1308-6578 OT Individual Time Calculation (min): 78 min    Patient has met 4 of 4 short term goals.  Pt is progressing with his strength, balance and endurance to complete mobility at a higher level and is developing more control of LUE.  He continues to have edema in hand that limits range of motion.  Pt continues to need cues due to impaired memory and slow processing.    Patient continues to demonstrate the following deficits: muscle weakness, decreased cardiorespiratoy endurance, abnormal tone and unbalanced muscle activation, decreased attention to left, decreased problem solving, decreased memory, and delayed processing, and decreased sitting balance, decreased standing balance, decreased postural control, and hemiplegia and therefore will continue to benefit from skilled OT intervention to enhance overall performance with BADL.  Patient progressing toward long term goals..  Continue plan of care.  OT Short Term Goals Week 1:  OT Short Term Goal 1 (Week 1): Pt will complete toilet transfers min A using LRAD OT Short Term Goal 1 - Progress (Week 1): Met OT Short Term Goal 2 (Week 1): Pt will recall hemi-dressing techniques with min verbal cues OT Short Term Goal 2 - Progress (Week 1): Met OT Short Term Goal 3 (Week 1): Pt will maintain sternal precautions during BADLs and functional transfers at 95% safe with min verbal cues OT Short Term Goal 3 - Progress (Week 1): Met OT Short Term Goal 4 (Week 1): Pt will maintain dynamic standing balance using LRAD during functional task >1 minute to demonstrate increased activity tolerance OT Short Term Goal 4 - Progress (Week 1): Met Week 2:  OT Short Term Goal 1 (Week 2):  Pt will demonstrate improved L FMC to fasten and unfasten LVAD connectors in less than 1 minute. OT Short Term Goal 2 (Week 2): Pt will demonstrate improved L hand function to grasp pants to pull over L side of hips with min A. OT Short Term Goal 3 (Week 2): Pt will demonstrate improved memory to recall steps of LVAD battery change with min cues. OT Short Term Goal 4 (Week 2): Pt will complete BSC or toilet transfers with min A.  Skilled Therapeutic Interventions/Progress Updates:    No c/o pain.  Pt received in bed and agreeable to sitting to EOB to engage in practice with UB dressing.  Min A to sit to EOB to follow sternal precautions.  During hospital stay, pt is using hospital gowns for shirts due to PICC line.  Today he was agreeable to practicing steps of UB dressing to recall hemidressing techniques. He did have fairly good recall, but shows decreased initiation and problem solving with tasks.  Donned pull over shirt with mod A and doffed mod A.  Reapplied gown.  The PICC line cord gets tangle in LVAD harness so had to unfasten front strap of harness and refasten to allow PICC line cord to flow through.  As he moves around the battery packs tend to fall back to his hips too far so adjusted straps and provided a removal velcro strap to use across chest to keep shoulder straps in front of shoulders. Pt completed stand pivot to w/c to engage in LUE NMR: -table top slides on towel for sh and elbow AROM -grasp and release of  cones to stack with A to move proximal arm and cues for pt to fully flex and extend fingers -wrapped hand in coban for 40 min of session to help with edema control -tip and lateral pinch ex and then practice with grasping LVAD practice cords. Pt is able to loosely hold one end in L hand to attach cords but need more strength to accomplish task safely and quickly  He then requested to use urinal. Pt standing with close CGA to use urinal.  Cues to fully push pants down to prepare  and mod A to adjust pants over left side of hips.  Pt stood for close to 3 minutes but then felt fatigued.  Cardiologist in and out to assess pt.  Pt's arm place on tray table to elevate hand.   Belt alarm on and all needs met.     Therapy Documentation Precautions:  Precautions Precautions: Sternal, Fall, Other (comment) Precaution Comments: LVAD Restrictions Weight Bearing Restrictions: No RUE Weight Bearing:  (sternal precautions) LUE Weight Bearing:  (sternal precautions) RLE Weight Bearing: Weight bearing as tolerated Other Position/Activity Restrictions: Sternal Precautions   ADL: ADL Eating: Minimal assistance Where Assessed-Eating: Bed level Grooming: Minimal assistance Where Assessed-Grooming: Edge of bed Upper Body Bathing: Moderate assistance Where Assessed-Upper Body Bathing: Edge of bed Lower Body Bathing: Maximal assistance Where Assessed-Lower Body Bathing: Edge of bed Upper Body Dressing: Moderate assistance Where Assessed-Upper Body Dressing: Edge of bed Lower Body Dressing: Moderate assistance Where Assessed-Lower Body Dressing: Edge of bed Toileting: Maximal assistance Where Assessed-Toileting: Bed level Toilet Transfer: Minimal assistance Toilet Transfer Method: Ambulating, Stand pivot Toilet Transfer Equipment: Grab bars, Bedside commode Tub/Shower Transfer: Not assessed Film/video editor: Not assessed   Therapy/Group: Individual Therapy  Cierria Height 10/05/2022, 12:29 PM

## 2022-10-05 NOTE — Progress Notes (Signed)
Patient ID: Charles Holmes, male   DOB: March 06, 1965, 57 y.o.   MRN: [id number]  Team Conference Report to Patient/Family  Team Conference discussion was reviewed with the patient and caregiver, including goals, any changes in plan of care and target discharge date.  Patient and caregiver express understanding and are in agreement.  The patient has a target discharge date of 10/17/22.  Sw met with patient and provided team conference updates. Patient pleased. Patient expressed he is unable to rest with therapy and constant traffic in his room. No additional questions or concerns. Sw will continue to follow up.   Andria Rhein 10/05/2022, 12:32 PM

## 2022-10-05 NOTE — Progress Notes (Signed)
Patient ID: Charles Holmes, male   DOB: 08-26-1965, 57 y.o.   MRN: [id number]  Patient established with [facility] for IV ABX at D/C.

## 2022-10-05 NOTE — Progress Notes (Signed)
Physical Therapy Session Note  Patient Details  Name: Charles Holmes MRN: [id number] Date of Birth: 1965-07-16  Today's Date: 10/05/2022 PT Individual Time: 7253-6644; 0347-4259 PT Individual Time Calculation (min): 50 min; 32 min   Short Term Goals: Week 1:  PT Short Term Goal 1 (Week 1): Pt will require min A consisntely for bed mobility PT Short Term Goal 2 (Week 1): Pt will require mod A with sit to stand with LRAD PT Short Term Goal 3 (Week 1): Pt will require max A x 1 for gait x 10 ft with LRAD  SESSION 1  Skilled Therapeutic Interventions/Progress Updates: Patient supine with nsg and nsg student on entrance to room. Patient alert and agreeable to PT session.   Patient reported no pain at beginning of PT session. Today's session focused on LVAD practice with fake connections. Pt used urinal in bed (reported needing to use toilet, but lasiks medication makes him need to go immediately). Pt then bridged to doff brief, and PTA threaded personal underwear/shorts through B LE's with pt cued to donn rest of the way (required assistance with donning on L side due to L UE hemi. PTA approximating L knee to stabilize while bridging). LVAD coordinator arrived to change dressing (PTA donned B knee high TED hoses for edema control on B LE's while LVAD coordinator performed care). Pt then supine to sit on EOB with assistance to bring L UE over midline, and minA at L shoulder to get to R sidelying (cued to use L LE to bridge hips over). Pt then light minA to get to sitting using R UE for truncal elevation. Pt sitting EOB while practicing LVAD connection with fake cords (4 rounds with pt getting fast each time). Pt required modA at first for cord placement in L hand as to get the best grip. Pt about to start timed trials, but reported needing to have BM. Pt sit to stand with no AD and CGA/light minA for safety to ambulate to toilet with PTA managing IV pole. Pt CGA throughout and required modA to doff  personal undergarments. Pt heavy modA to sit on toilet due to how low it is. PTA alerted NT and pt left on toilet with NT present at end of session.   SESSION 2 Skilled Therapeutic Interventions/Progress Updates: Patient supine in bed on entrance to room. Patient alert and agreeable to PT session.   Patient reported no pain or change in status since previous session. PTA donned/doffed home unit LVAD<>mobile unite for time management.   Therapeutic Activity: Bed Mobility: Pt performed supine<>sit on EOB with minA. VC required for UE placement, and use of L LE to bridge hip over to R sidelying when performing supine to sit EOB. Pt closer to light minA to get to supine with increased time required.  Transfers: Pt performed sit<>stand transfers throughout session with no AD and CGA for safety. Provided VC for pt to reach back to control descent.   Gait Training:  Pt ambulated 200'+ managing IV pole with R UE, PTA on L UE providing HHA, and WC follow for safety. Pt required 2 seated rest breaks. VC provided for pt to perform standing rest breaks (3 breaks), and to conserve energy vs pushing self through to exhaustion.   Therapeutic Exercise: - Pt ambulated 200'+ managing IV pole with R UE, PTA on L UE providing HHA, and WC follow for safety. Pt required 2 seated rest breaks. VC provided for pt to perform standing rest breaks (3 breaks), and  to conserve energy vs pushing self through to exhaustion. Pt performed to increase ambulatory endurance. Pt transported back to room after 2nd seated rest break in WC due to fatigue.  Patient supine in bed with NT present at end of session with brakes locked, pt on LVAD home unit, bed alarm set, and all needs within reach.       Therapy Documentation Precautions:  Precautions Precautions: Sternal, Fall, Other (comment) Precaution Comments: LVAD Restrictions Weight Bearing Restrictions: No RUE Weight Bearing:  (sternal precautions) LUE Weight Bearing:   (sternal precautions) RLE Weight Bearing: Weight bearing as tolerated Other Position/Activity Restrictions: Sternal Precautions  Therapy/Group: Individual Therapy  Yassin Scales PTA 10/05/2022, 12:09 PM

## 2022-10-05 NOTE — Progress Notes (Signed)
Occupational Therapy Session Note  Patient Details  Name: Charles Holmes MRN: [id number] Date of Birth: 03-23-1965  Today's Date: 10/05/2022 OT Individual Time: 1120-1205 OT Individual Time Calculation (min): 45 min    Short Term Goals: Week 1:  OT Short Term Goal 1 (Week 1): Pt will complete toilet transfers min A using LRAD OT Short Term Goal 2 (Week 1): Pt will recall hemi-dressing techniques with min verbal cues OT Short Term Goal 3 (Week 1): Pt will maintain sternal precautions during BADLs and functional transfers at 95% safe with min verbal cues OT Short Term Goal 4 (Week 1): Pt will maintain dynamic standing balance using LRAD during functional task >1 minute to demonstrate increased activity tolerance  Skilled Therapeutic Interventions/Progress Updates:  Pt greeted seated in w/c, pt agreeable to OT intervention. Pt already switched over to LVAD batteries. Pt transported to gym with total A. Worked on various therapeutic activities to assist with LUE FMC to be able to manage LVAD cords in the future. Pt able to place pegs in board with R hand holding board vertically and LUE placing pegs with a lateral pinch grip. Increased time needed to place pegs but able to place 2/3 with supervision.   Worked on increasing overall strenth and motor planning in LUE with pt given unweighted dowel rod with pt completing below light therex:  X10 bicep curls X10 chest presses X10 shoulder flexion to 90*  Pt transported back to room with total A with pt completing stand pivot back to bed with MIN HHA. Transferred pt back on wall LVAD with total A but had pt use practice wires to practice switching over. Built up wire with coband with pt better able to manipulate wires.   Ended session with pt supine in bed with bed alarm activated and all needs within reach.   Therapy Documentation Precautions:  Precautions Precautions: Sternal, Fall, Other (comment) Precaution Comments:  LVAD Restrictions Weight Bearing Restrictions: No RUE Weight Bearing:  (sternal precautions) LUE Weight Bearing:  (sternal precautions) RLE Weight Bearing: Weight bearing as tolerated Other Position/Activity Restrictions: Sternal Precautions  Pain: No pain reported    Therapy/Group: Individual Therapy  Pollyann Glen [facility] 10/05/2022, 12:10 PM

## 2022-10-05 NOTE — Progress Notes (Signed)
Patient ID: Charles Holmes, male   DOB: 1965/02/05, 57 y.o.   MRN: [id number]     Advanced Heart Failure Rounding Note  PCP-Cardiologist: Charles Herrlich, MD  [facility]: Dr. Gala Holmes   Subjective:    Admitted to CIR on 09/11  INR 1.7 after warfarin 5mg  the past two days.  Denies SOB, increase in urine output with lasix. Some mild central abdominal pain with movement consistent with muscle pain.  LVAD Interrogation HM III: Speed: 5400  Flow: 4.7  PI: 3.1 Power: 4.1  VAD interrogated personally. Parameters stable.  Objective:   Weight Range: 78.5 kg Body mass index is 27.11 kg/m.   Vital Signs:   Temp:  [97.9 F (36.6 C)-98.8 F (37.1 C)] 98.8 F (37.1 C) (09/19 0530) Pulse Rate:  [86-91] 86 (09/19 0530) Resp:  [16-18] 18 (09/19 0530) BP: (88-142)/(70-106) 99/77 (09/19 0800) SpO2:  [93 %-98 %] 93 % (09/19 0530) Weight:  [78.5 kg] 78.5 kg (09/19 0548) Last BM Date : 10/04/22  Weight change: Filed Weights   10/03/22 0500 10/04/22 0544 10/05/22 0548  Weight: 79.7 kg 80.5 kg 78.5 kg   Intake/Output:   Intake/Output Summary (Last 24 hours) at 10/05/2022 1100 Last data filed at 10/05/2022 0933 Gross per 24 hour  Intake 576 ml  Output 2225 ml  Net -1649 ml   Physical Exam  Physical Exam: GENERAL: No acute distress. In bed.  HEENT: normal  NECK: Supple, JVP  6-7.  2+ bilaterally, no bruits.  No lymphadenopathy or thyromegaly appreciated.   CARDIAC:  Mechanical heart sounds with LVAD hum present.  LUNGS:  Clear to auscultation bilaterally.  ABDOMEN:  Soft, round, nontender, positive bowel sounds x4.     LVAD exit site: well-healed and incorporated.  Dressing dry and intact.  No erythema or drainage.  Stabilization device present and accurately applied.  Driveline dressing is being changed per protocol per sterile technique. EXTREMITIES:  Warm and dry, no cyanosis, clubbing, rash. R and LLE 1-2+  edema . RUE weaker that Left  NEUROLOGIC:  Alert and oriented x 3.    No aphasia.   No dysarthria.  Affect pleasant.      Patient Profile  Charles Holmes is a 57 y.o. 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 Admitted with cardiogenic shock, stabilized and underwent HM3 LVAD. Post implant course c/b acute CVA and enterococcus faecalis bacteremia.   Assessment/Plan  1.  Acute on chronic Systolic HF-->Cardiogenic Shock  - Diagnosed 11/2019. Presented with VT. LHC 70% LAD  - cMRI 12/21 concerning for sarcoid and EF 18%.  - PET 2/22 at [facility] EF 25% + active sarcoid - Echo 08/26/20 EF < 20% severely dilated LV RV mildly decreased.  - [employer] CRT-D upgrade in 06/08/21 - Echo 07/10/22: EF <20%, RV okay, mod pericardial effusion, mod Charles/TR - Admitted 07/25 with cardiogenic shock. - RHC: Nonobstructive CAD, severely elevated filling pressures and low Fick CO/CI (2.7/1.4) - 08/13 HM III LVAD implant + clipping LAA d/t severe thickening and invagination of mitral valve annulus impeding flows.  - Apical core sent - no mention of sarcoid - Speed increased to 5300 on 08/14.  - Echo 8/26 mod-sev RV dysfunction. Speed increased to 5400 8/26 - RHC 8/27: low filing pressures with excellent cardiac output on EPI, PAPi 4.6 - Ramp Echo 9/4, speed increased to 5400  - MAP 80s  - On Sildenafil 20 mg tid.  -Continue diuresis with lasix 40mg  daily,  increase spironolactone to 25mg  daily - Continue digoxin 0.125 mg daily  2. HM-3 LVAD - VAD interrogated personally. Parameters stable. - LDH stable.  - Continue ASA 81 - INR 1.7 Continue warfarin.   3.  Acute stroke - Hx CVA 06/24 -Admitted 06/24 w/ R MCA stroke. S/p TPA and mechanical clot extraction. No residual deficits. Likely cardioembolic in setting of severe LV dysfunction. - Developed left sided weakness 08/14. CTA with R MCA infarct. Taken to IR for thrombectomy - Repeat CT head with small to moderate size hemorrhagic conversion.  - repeat head CT on 8/17 w/ improved  hemorrhagic CVA - Back on warfarin. - Continue ASA 81 mg daily - Appreciate CIR.  4. Enterococcus faecalis bacteremia - Bcx 2/2 on 9/6 - ID consult 9/6 -> ampicillin and ceftriaxone - TEE recommended but doubt it will change management as VAD cannot be removed and will require IV abx followed by long-term suppression - Echo 09/10 - no obvious vegetations -  Repeat BCx 9/8 NGTD. - ID recommending 6 weeks IV amp/ceftriaxone followed by long-term suppression with amoxicillin. Stop date 11/04/22  PICC replaced 9/14.  5. Hx VT - ln setting of potential sarcoid heart disease  - Off amio due to tremor. Continue mexiletine  - now s/p ICD.   6. CAD - LHC 12/07/19 70-% LAD, no intervention - LHC 8/24 non obstructive CAD.  - Continue statin. On aspirin for VAD.  7. 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   8. 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 Iron deficiency anemia/ Post-op anemia - Has had IV Fe.    10. Pulmonary  - PFTs with severe obstructive defect, response to bronchodilator. FEV1 1.04L, FEV1/FVC 48% - Stable  11. Psych - Depression and delirium post VAD implant. Prolonged hospital course.  - Improving - Continue sertraline   Charles Holmes 10/05/2022

## 2022-10-05 NOTE — Patient Care Conference (Signed)
Inpatient RehabilitationTeam Conference and Plan of Care Update Date: 10/04/2022   Time: 10:20 AM    Patient Name: Charles Holmes      Medical Record Number: [id number]  Date of Birth: 11/01/1965 Sex: Male         Room/Bed: 4W12C/4W12C-02 Payor Info: Payor: Advertising copywriter MEDICARE / Plan: [facility] MEDICARE / Product Type: *No Product type* /    Admit Date/Time:  09/27/2022  4:00 PM  Primary Diagnosis:  Acute right MCA stroke [facility])  Hospital Problems: Principal Problem:   Acute right MCA stroke [facility])    Expected Discharge Date: Expected Discharge Date: 10/17/22  Team Members Present: Physician leading conference: Dr. Claudette Laws Social Worker Present: Lavera Guise, BSW Nurse Present: Chana Bode, RN PT Present: Casimiro Needle, PT OT Present: Roney Mans, OT SLP Present: Feliberto Gottron, SLP PPS Coordinator present : Fae Pippin, SLP     Current Status/Progress Goal Weekly Team Focus  Bowel/Bladder   Pt is continent, but have some incontinent episodes of bladder. He states it's d/t lasix that he's given in the AM. LBM: 9/17   Improve bladder function by retraining the bladder, timed voiding, and urge suppression.   Assist w/ toileting needs as often as needed.    Swallow/Nutrition/ Hydration               ADL's   MAX A for toileting, MIN A for functional mobility, MAX A for LB ADLs, MIN A for UB ADLs   supervision with UB dressing, CGA LB dressing, bathing, toilet transfers; min A toileting   LUE NMR, activity tolerance, functional mobility, endurance    Mobility   Beb mobility - min/modA; Transfers - CGA/minA; Ambulation - CGA/light minA with no AD   supervision bed mobility and transfers, CGA household ambulation, min A 1 step, supervision w/c mobility  Barriers - L hemi; Focus - L LE/UE NMR, gait tolerance, dynamic standing balance    Communication   Min-Mod A   Mod I   use of speech intelligibility strategies at the phrase and sentence level     Safety/Cognition/ Behavioral Observations  Min-Mod A   Supervision-Mod I   recall and problem solving with procedures of LVAD    Pain   Pt denies pain   Remain pain free.   Assess pain q shift & PRN.    Skin   Several skin tear areas on arms. Abrasion to L side of abdomen. dressing where LVAD is.   Maintain skin integrity.  Assess skin tear q shift & PRN.      Discharge Planning:  discharging home with cousins 24/7. Daughter to assist PRN and IV ABX until 10/19.   Team Discussion: Patient post MCA CVA post op LVAD with edema left UE and weakness in left UE with poor endurance, speech intelligibility deficits, vocal intensity issues, poor recall, and problem solving. Note more engaged since admission but sleep still interrupted; on melatonin.   Patient on target to meet rehab goals: yes, currently needs max assist for toileting and lower body ADLs. Needs min assist for upper body care due to left side weakness. Needs CGA - min assist for transfers and min assist for ambulation due to loss of balance during turns without an assistive device.  Goals for discharge set for supervision - min assist for steps  *See Care Plan and progress notes for long and short-term goals.   Revisions to Treatment Plan:  IV abx thru 11/04/22   Teaching Needs: Safety, medications, dietary modifications, LVAD  care/wear, skin care, transfers, toileting, etc.   Current Barriers to Discharge: Decreased caregiver support and Home enviroment access/layout  Possible Resolutions to Barriers: Family education HH follow up services     Medical Summary Current Status: monitoring LVAD surgical sites, minimal strength improvements since admit in LUE  Barriers to Discharge: Medical stability;Self-care education;Other (comments)  Barriers to Discharge Comments: LVAD teaching Possible Resolutions to Barriers/Weekly Focus: CVTS, Heart failure team following closely   Continued Need for Acute  Rehabilitation Level of Care: The patient requires daily medical management by a physician with specialized training in physical medicine and rehabilitation for the following reasons: Direction of a multidisciplinary physical rehabilitation program to maximize functional independence : Yes Medical management of patient stability for increased activity during participation in an intensive rehabilitation regime.: Yes Analysis of laboratory values and/or radiology reports with any subsequent need for medication adjustment and/or medical intervention. : Yes   I attest that I was present, lead the team conference, and concur with the assessment and plan of the team.   Chana Bode B 10/05/2022, 10:46 AM

## 2022-10-05 NOTE — Progress Notes (Signed)
Patient ID: Charles Holmes, male   DOB: 1965/06/19, 57 y.o.   MRN: [id number]  SW made attempt to call pt daughter, Charles Holmes to provide conference updates. Sw left a detailed VM.   Sw made attempt to call cousin, Charles Holmes. Sw unable to leave VM due to VM box not set up. SW will continue to FU.  SW will FU with pt during lunch.

## 2022-10-06 ENCOUNTER — Inpatient Hospital Stay (HOSPITAL_COMMUNITY): Payer: Medicare Other

## 2022-10-06 DIAGNOSIS — R609 Edema, unspecified: Secondary | ICD-10-CM | POA: Diagnosis not present

## 2022-10-06 LAB — BASIC METABOLIC PANEL
Anion gap: 5 (ref 5–15)
BUN: 6 mg/dL (ref 6–20)
CO2: 28 mmol/L (ref 22–32)
Calcium: 8.3 mg/dL — ABNORMAL LOW (ref 8.9–10.3)
Chloride: 104 mmol/L (ref 98–111)
Creatinine, Ser: 0.62 mg/dL (ref 0.61–1.24)
GFR, Estimated: >60 mL/min (ref >60)
Glucose, Bld: 102 mg/dL — ABNORMAL HIGH (ref 70–99)
Potassium: 4 mmol/L (ref 3.5–5.1)
Sodium: 137 mmol/L (ref 135–145)

## 2022-10-06 LAB — CBC
HCT: 34.2 % — ABNORMAL LOW (ref 39.0–52.0)
Hemoglobin: 9.8 g/dL — ABNORMAL LOW (ref 13.0–17.0)
MCH: 26.9 pg (ref 26.0–34.0)
MCHC: 28.7 g/dL — ABNORMAL LOW (ref 30.0–36.0)
MCV: 94 fL (ref 80.0–100.0)
Platelets: 296 10*3/uL (ref 150–400)
RBC: 3.64 MIL/uL — ABNORMAL LOW (ref 4.22–5.81)
RDW: 18.1 % — ABNORMAL HIGH (ref 11.5–15.5)
WBC: 8.2 10*3/uL (ref 4.0–10.5)
nRBC: 0 % (ref 0.0–0.2)

## 2022-10-06 LAB — MAGNESIUM: Magnesium: 1.8 mg/dL (ref 1.7–2.4)

## 2022-10-06 LAB — PROTIME-INR
INR: 2.1 — ABNORMAL HIGH (ref 0.8–1.2)
Prothrombin Time: 23.4 seconds — ABNORMAL HIGH (ref 11.4–15.2)

## 2022-10-06 MED ORDER — MAGNESIUM SULFATE 2 GM/50ML IV SOLN
2.0000 g | Freq: Once | INTRAVENOUS | Status: AC
  Administered 2022-10-06: 2 g via INTRAVENOUS
  Filled 2022-10-06: qty 50

## 2022-10-06 MED ORDER — WARFARIN SODIUM 3 MG PO TABS
3.0000 mg | ORAL_TABLET | Freq: Every day | ORAL | Status: DC
  Filled 2022-10-06: qty 1

## 2022-10-06 MED ORDER — WARFARIN SODIUM 4 MG PO TABS
4.0000 mg | ORAL_TABLET | Freq: Once | ORAL | Status: AC
  Administered 2022-10-06: 4 mg via ORAL
  Filled 2022-10-06 (×2): qty 1

## 2022-10-06 NOTE — Progress Notes (Signed)
Informed by [place] technician that a superficial DVT to left cephalic vein. Dois Davenport, [place] and Dr. Wynn Banker made aware.  Marylu Lund, RN

## 2022-10-06 NOTE — Progress Notes (Signed)
PROGRESS NOTE   Subjective/Complaints:  Appreciate CVTS note, pt c/o dysuria yesterday  UA neg afeb   ROS:   Pt denies SOB, abd pain, CP, N/V/C/D, and vision changes  Objective:   No results found. No results for input(s): "WBC", "HGB", "HCT", "PLT" in the last 72 hours.   Recent Labs    10/04/22 0507 10/05/22 0307  NA 139 137  K 3.6 3.7  CL 104 101  CO2 27 31  GLUCOSE 76 83  BUN 10 7  CREATININE 0.67 0.62  CALCIUM 8.4* 8.1*    Intake/Output Summary (Last 24 hours) at 10/06/2022 0811 Last data filed at 10/06/2022 0531 Gross per 24 hour  Intake 580 ml  Output 1875 ml  Net -1295 ml        Physical Exam: Vital Signs Blood pressure 98/80, pulse 91, temperature 98.7 F (37.1 C), temperature source Oral, resp. rate 16, height 5\' 7"  (1.702 m), weight 77.8 kg, SpO2 95%.   General: No acute distress Mood and affect are appropriate Heart: Regular rate and rhythm no rubs murmurs or extra sounds Lungs: Clear to auscultation, breathing unlabored, no rales or wheezes Abdomen: Positive bowel sounds, soft nontender to palpation, nondistended Extremities: No clubbing, cyanosis,dependant edema LUE, no erythema  Skin: erythematous well demarcated inguinal fold macular rash    Skin: chest incisions clean healing well , LVAD drive site clean/dressed  PRIOR EXAMS: Neuro:  Alert and oriented x 3. Fair insight and awareness. Intact Memory. Normal language and speech. Cranial nerve exam unremarkable except for mild left central 7. MMT: LUE delt, bi, tri, grip   3/5 prox to distal. LLE 4-/5 throughout. Pt with intact  LT sense on left but feels a little different compared to R side . No abnl resting tone. DTR's tr to1+.   Musculoskeletal: Full ROM, No pain with AROM or PROM in the neck, trunk, or extremities. Posture appropriate     Assessment/Plan: 1. Functional deficits which require 3+ hours per day of interdisciplinary  therapy in a comprehensive inpatient rehab setting. Physiatrist is providing close team supervision and 24 hour management of active medical problems listed below. Physiatrist and rehab team continue to assess barriers to discharge/monitor patient progress toward functional and medical goals  Care Tool:  Bathing    Body parts bathed by patient: Chest, Left arm, Abdomen, Front perineal area, Face   Body parts bathed by helper: Buttocks, Right upper leg, Left upper leg, Right lower leg, Left lower leg, Right arm     Bathing assist Assist Level: Maximal Assistance - Patient 24 - 49%     Upper Body Dressing/Undressing Upper body dressing   What is the patient wearing?: Pull over shirt    Upper body assist Assist Level: Maximal Assistance - Patient 25 - 49%    Lower Body Dressing/Undressing Lower body dressing      What is the patient wearing?: Pants     Lower body assist Assist for lower body dressing: Minimal Assistance - Patient > 75%     Toileting Toileting    Toileting assist Assist for toileting: Maximal Assistance - Patient 25 - 49%     Transfers Chair/bed transfer  Transfers assist  Chair/bed transfer activity did not occur: Safety/medical concerns (fatigue)  Chair/bed transfer assist level: Contact Guard/Touching assist     Locomotion Ambulation   Ambulation assist   Ambulation activity did not occur: Safety/medical concerns (fatigue)  Assist level: Contact Guard/Touching assist Assistive device: Walker-rolling Max distance: 150'   Walk 10 feet activity   Assist  Walk 10 feet activity did not occur: Safety/medical concerns (fatigue)  Assist level: Contact Guard/Touching assist Assistive device: Walker-rolling   Walk 50 feet activity   Assist Walk 50 feet with 2 turns activity did not occur: Safety/medical concerns  Assist level: Contact Guard/Touching assist Assistive device: Walker-rolling    Walk 150 feet activity   Assist Walk 150  feet activity did not occur: Safety/medical concerns  Assist level: Contact Guard/Touching assist Assistive device: Walker-rolling    Walk 10 feet on uneven surface  activity   Assist Walk 10 feet on uneven surfaces activity did not occur: Safety/medical concerns (fatigue)         Wheelchair     Assist Is the patient using a wheelchair?: Yes Type of Wheelchair: Manual Wheelchair activity did not occur: Safety/medical concerns (fatigue)         Wheelchair 50 feet with 2 turns activity    Assist    Wheelchair 50 feet with 2 turns activity did not occur: Safety/medical concerns       Wheelchair 150 feet activity     Assist  Wheelchair 150 feet activity did not occur: Safety/medical concerns       Blood pressure 98/80, pulse 91, temperature 98.7 F (37.1 C), temperature source Oral, resp. rate 16, height 5\' 7"  (1.702 m), weight 77.8 kg, SpO2 95%.  Medical Problem List and Plan: 1. Functional deficits secondary to acute right MCA infarct/ LVAD pt             -patient may shower             -ELOS/Goals: modI/S 10/17/22 days   -Con't CIR PT, OT and SLP   2.  Antithrombotics: -DVT/anticoagulation:  Pharmaceutical: Coumadin  PT therapeutic 9/20- 2.1 Has been subtherapeutic for the last 3 d in 1.7-1.8 range               -antiplatelet therapy: Aspirin 81 mg daily   3. Pain Management: Tylenol and Tramadol as needed   4. Mood/Behavior/Sleep: delirium/insomnia - team to provide ongoing ego support             -continue mirtazapine 7.5 mg q HS--not ordered? Defer to weekday team             -continue sertraline 50 mg daily             -continue melatonin 3 mg q HS -Started Trazodone 50mg  at bedtime on 09/29/22             -antipsychotic agents: n/a -9/13- mood somewhat better than when saw pt Tuesday- con't regimen and monitor -09/30/22 not sleeping well, increased Melatonin to 5mg  QHS; if not helping, may consider restarting mirtazapine as above 9/18  poor sleep related to freq urination related to lasix , intake of fluids is minimal may need to use comp hose to prvent accumulation during the day and prevent nocturnal diuresis  5. Neuropsych/cognition: This patient is capable of making decisions on his own behalf.   6. Skin/Wound Care: Routine skin care checks   7. Fluids/Electrolytes/Nutrition: strict Is and Os and follow-up chemistries             -  poor PO intake on Marinol             -continue supplements   -9/12 labs reviewed. low albumin still continue interventions above  8: hypotension but difficult to assess due to LVAD    9/13- BP soft, but denies orthostatic hypotension -9/14-15/24 BPs generally stable/improving; was previously on midodrine, but per HF team pt was stable off it;  Difficult to measure BP due to LVAD as well as LUE edema post CVA Vitals:   10/04/22 0900 10/04/22 1253 10/04/22 1930 10/04/22 2130  BP: 96/81 (!) 142/106 (!) 88/74 106/76   10/05/22 0130 10/05/22 0530 10/05/22 0800 10/05/22 1512  BP: 106/70 101/73 99/77 93/80    10/05/22 2115 10/06/22 0115 10/06/22 0515 10/06/22 0752  BP: 99/71 101/85 99/66 98/80      9: Hyperlipidemia: continue atorvastatin 80mg  daily   10: Enterococcus bacteremia: 6 weeks IV antibiotic, ceftriaxone and ampicillin last dose 10/19 will go home with PICC and HH             -follow-up with ID  -09/30/22 PICC placed this AM  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: Home meds: Coreg 3.125, Lasix 40mg , Lostartan 25mg , Aldactone 12.5, Farxiga 10mg , mexilitene --holding all currently   12: Acute on chronic systolic heart failure with cardiogenic shock s/p LVAD             -daily weights balanced so far             -continue digoxin 0.125 mg daily             -continue mexiletine 250 mg BID -continue midodrine 5 mg q 8 hours--not ordered, per HF team on 9/13 pt was stable off Midodrine; hold for now             -continue sildenafil 20 mg q 8 hours             -continue warfarin per pharmacy    Managed by Heart Failure team - coordinating with primary service for activity tolerance monitoring  Filed Weights   10/04/22 0544 10/05/22 0548 10/06/22 0544  Weight: 80.5 kg 78.5 kg 77.8 kg    13: Hx of VT: continue mexiletine, digoxin; has ICD   14: DM-2: A1c = 5.5%; monitor PO intake; DC SSI (Farxiga at home)   15: OSA: needs outpatient sleep study   16: Anemia, iron deficiency: follow-up CBC--most recent hgb 9.5--lab pending still today?  -    Latest Ref Rng & Units 10/02/2022    2:36 AM 10/01/2022    3:39 AM 09/27/2022    2:26 AM  CBC  WBC 4.0 - 10.5 K/uL 7.8  8.2  9.7   Hemoglobin 13.0 - 17.0 g/dL 8.7  8.9  9.5   Hematocrit 39.0 - 52.0 % 30.7  31.2  32.6   Platelets 150 - 400 K/uL 235  251  307       17: Severe COPD:              -continue Ellipta one puff daily   18: S/p tooth extractions this admission: monitor PO tolerance   19: Glaucoma: continue Cosopt and Xalatan   20.  Candidiasis in groin start nystatin powder   21.  Dysuria- UA neg , on broad spectrum IV abx for bacteremia   22.  LUE edema post CVA as discussed with pt , quite common with his degree of weakness, await [place] LUE ordered by CVTS- if DVT noted will need CVTS input on treatment , on warfarin but has  been subtherapeutic  LOS: 9 days A FACE TO FACE EVALUATION WAS PERFORMED  Erick Colace 10/06/2022, 8:11 AM

## 2022-10-06 NOTE — Progress Notes (Addendum)
ANTICOAGULATION CONSULT NOTE - Follow-up  Pharmacy Consult for warfarin Indication:  LVAD  Allergies  Allergen Reactions   Pacerone [Amiodarone] Other (See Comments)    Severe tremors   Percocet [Oxycodone-Acetaminophen] Itching    Patient Measurements: Height: 5\' 7"  (170.2 cm) Weight: 77.8 kg (171 lb 8.3 oz) IBW/kg (Calculated) : 66.1 Heparin Dosing Weight: 87kg  Vital Signs: Temp: 98.7 F (37.1 C) (09/20 0515) Temp Source: Oral (09/20 0515) BP: 98/80 (09/20 0752) Pulse Rate: 91 (09/20 0515)  Labs: Recent Labs    10/04/22 0507 10/05/22 0307 10/06/22 0409  LABPROT 19.9* 20.3* 23.4*  INR 1.7* 1.7* 2.1*  CREATININE 0.67 0.62  --     Estimated Creatinine Clearance: 93 mL/min (by C-G formula based on SCr of 0.62 mg/dL).   Medical History: Past Medical History:  Diagnosis Date   CAD (coronary artery disease)    CHF (congestive heart failure) (HCC)    COPD (chronic obstructive pulmonary disease) (HCC)    GERD (gastroesophageal reflux disease)    Hyperlipidemia    Hypertension    LVAD (left ventricular assist device) present (HCC)    Stroke (HCC)    Systolic heart failure (HCC) 2021   LVEF 18%, RVEF 38% on cardiac MRI 12/19/2019. possible cardiac sarcoidosis.   Wide-complex tachycardia 2021   wears LifeVest     Assessment: 57 yoM on apixaban PTA for hx AF admitted for LVAD workup. Pt s/p HM3 implant on 8/13 c/b acute CVA postop, on aspirin 81 mg daily. Started low dose heparin + warfarin started 8/19. Low-dose heparin was stopped 8/31.  INR therapeutic at 2.1, CBC remains stable, LHD stable. No s/sx of bleeding. Oral intake of scheduled meals is improving. No dose of warfarin given yesterday. Will need to resume therapy to maintain therapeutic INR.  Goal of Therapy:  INR 2-2.5 Monitor platelets by anticoagulation protocol: Yes   Plan:  Warfarin 4 mg x1 Recommend reducing dose tomorrow to target improved maintenance dose Monitor daily INR, CBC  Thank you  for allowing pharmacy to participate in this patient's care,  Wilmer Floor, PharmD PGY2 Cardiology Pharmacy Resident Please see AMION for all Pharmacists' Contact Phone Numbers 10/06/2022, 9:19 AM

## 2022-10-06 NOTE — Progress Notes (Signed)
Occupational Therapy Session Note  Patient Details  Name: Charles Holmes MRN: [id number] Date of Birth: 1965-07-30  Today's Date: 10/06/2022 OT Individual Time: 1007-1034 session 1 OT Individual Time Calculation (min): 27 min  Session 2: 1417-1500   Short Term Goals: Week 2:  OT Short Term Goal 1 (Week 2): Pt will demonstrate improved L FMC to fasten and unfasten LVAD connectors in less than 1 minute. OT Short Term Goal 2 (Week 2): Pt will demonstrate improved L hand function to grasp pants to pull over L side of hips with min A. OT Short Term Goal 3 (Week 2): Pt will demonstrate improved memory to recall steps of LVAD battery change with min cues. OT Short Term Goal 4 (Week 2): Pt will complete BSC or toilet transfers with min A.  Skilled Therapeutic Interventions/Progress Updates:  Session 1: pt greeted seated in w/c on his batteries. Pt agreeable to OT intervention. Session focused on various therapeutic activities to facilitate improved LUE ROM/ motor planning and coordination, pt completed therex with 1 lb dowel rod  Climbing up and down horizontal rod x10 reps to facilitate improved LUE motor planning and strength, MIN A needed at L elbow for support.  Holding rod horizontally and using bar to push cones off table to facilitate improved LUE motor control, pt completed task with MIN support at L elbow.  Pushing rod forward backwards with an emphasis on scapular protraction/retraction  Stacking cones with LUE to challenge functional reach.   Pt C/o chest pain, alerted nurse who entered to assess pt. Pt completed stand pivot back to bed with MIN HHA. Ended session with pt supine in bed with nurse present.   Session 2: Pt greeted supine in bed, pt agreeable to OT intervention. Pt completed supine>sit withCGA, MIN verbal cues needed for sternal precautions as pt wanting to pull up with BUEs. Pt able to verbalize steps of placing pt on LVAD batteries with MIN verbal cues for sequencing.  Pt completed stand pivot to w/c with no AD and MIN A.   Therapeutic activity:  Pt completed functional reaching task in gym with pt using LUE to remove squigz on table with an emphasis on gross grasp and targeted reaching. Pt needed MIN A at L elbow for support. Pt with low frustration tolerance today, feel related to lack of sleep. Graded task up and had pt stand to place/remove squigz from window with an emphasis on functional grasp with LUE. Pt stood with light MINA. Pt needed MIN support at L elbow to complete task.    ADLs:   Toileting: pt completed functional mobility into bathroom with MIN HHA.pt with continent urine void pt completing 3/3 toileting tasks with MODA.    Ended session with pt seated on toilet with NT present.   Therapy Documentation Precautions:  Precautions Precautions: Sternal, Fall, Other (comment) Precaution Comments: LVAD Restrictions Weight Bearing Restrictions: No RUE Weight Bearing:  (sternal precautions) LUE Weight Bearing:  (sternal precautions) RLE Weight Bearing: Weight bearing as tolerated Other Position/Activity Restrictions: Sternal Precautions  Pain:   No pain during either session    Therapy/Group: Individual Therapy  Pollyann Glen [facility] 10/06/2022, 12:21 PM

## 2022-10-06 NOTE — Progress Notes (Signed)
Physical Therapy Session Note  Patient Details  Name: Charles Holmes MRN: [id number] Date of Birth: April 24, 1965  Today's Date: 10/06/2022 PT Individual Time: 0920-1000 PT Individual Time Calculation (min): 40 min   Short Term Goals: Week 1:  PT Short Term Goal 1 (Week 1): Pt will require min A consisntely for bed mobility PT Short Term Goal 2 (Week 1): Pt will require mod A with sit to stand with LRAD PT Short Term Goal 3 (Week 1): Pt will require max A x 1 for gait x 10 ft with LRAD  Skilled Therapeutic Interventions/Progress Updates: Patient supine in bed on top of bedpan. Pt alert and agreeable to PT. Pt reported no pain at beginning of PT session. PTA removed bedpan (no BM) with pt performing bridge with B LE's. PTA threaded pt's personal undergarments (underwear and shorts) up to B knee's. Pt cued to use R UE to doff onto waist while performing bridge. Pt required minA to doff around L waist due to L UE hemi. Pt then supine to sit with minA and multimodal cues to bring L UE across midline. Pt sitting EOB with PTA transferring pt from home LVAD unit to mobile unit with pt verbally giving instructions (pt continues to have difficulty with switching units due to decrease dexterity in L hand, and the limited time it takes to do so safely - pt recalled switching white cord first, and to connect connections to form a full moon). Pharmacist arrived shortly after talking to pt about blood thinner medication with MD arriving shortly after. Pt required use of toilet to have BM. Pt sit to stand from EOB with CGA, and instructions to manage IV pole on R UE. Pt ambulated with CGA and L HHA for safety to toilet (assistance to navigate IV pole over entrance to bathroom). Pt totalA to doff/donn personal shorts/underwear for time management. Pt minA to control descent to toilet with use of R UE on railing. Pt had BM (charted). Pt educated to avoid using bed pan as it adds difficulty to have a BM due to anatomy, and  to ambulate to either [facility] with nsg, or toilet. Pt stood from toilet with modA due to the low height of it. Pt ambulated to [facility] in same manner managing IV pole with CGA. Pt cued to anteriorly lean in WC to posteriorly scoot and adjust self in WC. IV nurse arrived at that time.   Patient in [facility] at end of session with brakes locked, IV nurse present, and all needs within reach.      Therapy Documentation Precautions:  Precautions Precautions: Sternal, Fall, Other (comment) Precaution Comments: LVAD Restrictions Weight Bearing Restrictions: No RUE Weight Bearing:  (sternal precautions) LUE Weight Bearing:  (sternal precautions) RLE Weight Bearing: Weight bearing as tolerated Other Position/Activity Restrictions: Sternal Precautions   Therapy/Group: Individual Therapy  Taggert Bozzi PTA 10/06/2022, 3:23 PM

## 2022-10-06 NOTE — Progress Notes (Signed)
LVAD Coordinator Rounding Note:  Admitted 08/10/22 due to acute on chronic CHF with cardiogenic shock. Milrinone dependent. Advance therapy workup completed, and pt deemed acceptable VAD candidate. Dental extractions completed 8/6. IABP placed 08/25/22.  HM 3 LVAD implanted on 08/29/22 by Dr Donata Clay under destination therapy criteria. 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 Result negative.  7/25 Admit with cardiogenic shock. Started milrinone and NE. 8/6 S/P 13 teeth extractions  8/9 IABP placed 8/13 S/p HM III LVAD implant + clipping left atrial appendage d/t severe thickening and invagination of mitral valve annulus impeding flows  CT Head 8/14 (initial) 1. 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 ASPECTS is 7. 2. No acute hemorrhage.  CT Angio Head/Neck 1. 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 2. Core infarct of 12 cc (somewhat underestimated compared to aspects) with 90 cc of penumbra. 3. Mild atherosclerosis.  Pt taken emergently to IR for percutaneous right common carotid arteriogram with thrombectomy. Revascularization achieved. Angio-seal closure device applied to left groin- clean, dry, and intact.   CT Head 8/15 @ 0450 Unchanged extent of infarct and hemorrhage in the right MCA distribution including small volume intraventricular clot. No hydrocephalus.  CT Head 8/17 @ 0701 Interval evolution of the right MCA territory infarct with decreased intraparenchymal and intraventricular hemorrhage. No hydrocephalus or midline shift.   Pt lying in bed this morning in good spirits. He is making progress. ST in the room. Pt tentative d/c date is October 1.  Blood cultures from 9/6 positive for Enterococcus Faecalis. Subsequent blood cultures NGTD. ID following recommending 6 weeks of IV Ampicillin and Ceftriaxone followed by chronic suppression with Amoxicillin.   VAD coordinator/nurses working with pt on changing  power sources. He is unable to do this due to his left sided weakness. Modification of his power cords was made in hopes he can grip the cords better. Has practice power cords for use by pt. See below for education documentation. He is making progress with therapy.  Pt may not shower at this time.   Vital signs: Temp: 98.7 HR: 91 Doppler Pressure: 90 Automatic BP: 98/80 (87) O2 Sat: 95% on RA Wt: 183.6>191.1>190.9>190.2>184.8>...>163.1>162.5>171.3>172.4>176.6> 180.6>175.1>174.8>176.3>175.7>177.5>173>171.5 lbs   LVAD interrogation reveals:  Speed: 5400 Flow: 4.4 Power: 4.0 w PI: 4.6  Alarms: none Events: 4 PI today  Hematocrit: 30 Fixed speed: 5400 Low speed limit: 5100  Drive Line: CDI. Drive line anchor secure. Continue twice a week dressing changes on Monday/Thursday. Next dressing change due 10/09/22 by VAD coordinator or nurse champion only or [place].   Labs:  LDH trend: 596>441>165>384>345>312>312>350>327>309>316>305>298>277>273>298>280>285>251>252>205  INR trend: 1.4>1.6>1.1>1.1>1.3>1.2>1>1.3>1.4>1.5>1.6>2.8>2.8>2.3>2.2>2.3>2.6>2.6>1.7>2>1.8>1.7>2.1  AST/ALT trend: 218/45>96/30>73/23>63/36>58/37>69/50>93/72>123/103>143/150>117/138>119/138>92/127>87/123>50/71>46/65>46/43  Total Bili trend: 4.4>4.6>5.1>1.8>1.9>1.2>1.0>1.5>1.1>0.8>0.9>1.0>0.8  WBC trend: 11.6>13.7>19.6>16.1>23>26.6>21>24.6>18.1>16.5>13.2>13.1>12.5>11.6>10.5>10.8>13.1>9.5>9.1>9.7>7.8  Anticoagulation Plan: -INR Goal: 2.0 - 2.5 -ASA Dose: 81 mg  - Coumadin dosing per pharmacy  Blood Products:  IntraOp 8/14: - 4 FFP - 2 Platelets - 2 PRBC - 1 cyro - 449 cc of cellsaver - DDAVP 20 mcg x 1   Device: Medtronic BiV -Therapies: ON VF ON >207 BPM FVT ON 176-207 BPM VT ON 171-207 BPM  Arrythmias:   Respiratory:   Infection:  09/01/22>> sputum cx>> NGTD>>final 09/01/22>> blood cx>> NGTD>>final 09/01/22>> urine cx>> NGTD>> final 09/13/22>>blood cxs>>no growth 5 days; final 09/22/22>>blood cxs>>  Enterococcus faecalis  09/23/22>> blood cxs>> neg final 09/24/22>> blood cxs>> neg final  Renal:  9/12: BUN/CRT: 8/0.62  Adverse Events on VAD: - 08/30/22: - Developed left sided weakness this am. CTA with  R MCA infarct. Taken to IR for thrombectomy    Patient Education: Discharge teaching completed with pt's [name] on 9/5. See separate note for documentation.   Plan/Recommendations:  Please page VAD coordinator for any alarms or VAD equipment issues. Continue twice a week dressing changes on Monday/Thursday by VAD coordinator, Nurse Alla Feeling or [place].   Carlton Adam RN VAD Coordinator  Office: [phone]  24/7 Pager: [phone]

## 2022-10-06 NOTE — Progress Notes (Signed)
Physical Therapy Weekly Progress Note  Patient Details  Name: Charles Holmes MRN: [id number] Date of Birth: 1965-07-17  Beginning of progress report period: September 28, 2022 End of progress report period: October 06, 2022  Patient has met 3 of 3 short term goals. Pt demonstrates increase in functional mobility since evaluation by meeting, and exceeding, all of this weeks STG's. Pt is currently minA for bed mobility with greatest deficit being decrease in L UE strength and coordination. Pt also has exceeded sit to stand goal as pt currently performs transfer with CGA without an AD for safety, and supervision with RW. Lastly, pt demonstrates ability to ambulate more than 10' using only L HHA or no HHA at [facility] level (longest pt has ambulated is more than 150' with several rest breaks due to SOB/fatigue, R UE managing IV pole, and L HHA). Pt continues to be functionally limited by inability to switch LVAD from home<>mobile unit without requiring increased time (this has only been recently implemented to therapy sessions) due to decrease in dexterity/strength in L hand (pt has practice connections). Pt also requires voiding of either BM or bladder (pt on lasix and has swelling in L UE), which impedes on therapy interventions.   Patient continues to demonstrate the following deficits muscle weakness, decreased cardiorespiratoy endurance, abnormal tone and decreased coordination, decreased attention to left, and decreased sitting balance, decreased standing balance, hemiplegia, and decreased balance strategies and therefore will continue to benefit from skilled PT intervention to increase functional independence with mobility.  Patient {LTG progression:3041653}.  {plan of TKZS:0109323}  PT Short Term Goals Week 1:  PT Short Term Goal 1 (Week 1): Pt will require min A consisntely for bed mobility PT Short Term Goal 1 - Progress (Week 1): Met PT Short Term Goal 2 (Week 1): Pt will require mod A with sit  to stand with LRAD PT Short Term Goal 2 - Progress (Week 1): Met PT Short Term Goal 3 (Week 1): Pt will require max A x 1 for gait x 10 ft with LRAD PT Short Term Goal 3 - Progress (Week 1): Met  Therapy Documentation Precautions:  Precautions Precautions: Sternal, Fall, Other (comment) Precaution Comments: LVAD Restrictions Weight Bearing Restrictions: No RUE Weight Bearing:  (sternal precautions) LUE Weight Bearing:  (sternal precautions) RLE Weight Bearing: Weight bearing as tolerated Other Position/Activity Restrictions: Sternal Precautions  Lilley Hubble PTA  10/06/2022, 3:27 PM

## 2022-10-06 NOTE — Progress Notes (Signed)
Upper extremity venous duplex completed. Please see CV Procedures for preliminary results.  Initial results reported to [facility], Charity fundraiser.  Shona Simpson, RVT 10/06/22 4:05 PM

## 2022-10-06 NOTE — Progress Notes (Signed)
Orthopedic Tech Progress Note Patient Details:  Charles Holmes [birth year] [id number]  Ortho Devices Type of Ortho Device: Wrist splint Ortho Device/Splint Location: Left wrist brace Ortho Device/Splint Interventions: Ordered, Application, Adjustment   Post Interventions Patient Tolerated: Well  [name] 10/06/2022, 1:00 PM

## 2022-10-06 NOTE — Progress Notes (Signed)
Patient ID: Charles Holmes, male   DOB: 1965/11/30, 57 y.o.   MRN: [id number]     Advanced Heart Failure Rounding Note  PCP-Cardiologist: Norman Herrlich, MD  [facility]: Dr. Gala Romney   Subjective:    Admitted to CIR on 09/11  INR 2.1, for unclear reasons did not get warfarin yesterday. Dose of 3mg  tonight. LE improved, will stop lasix as likely just needs prn. MAPs elevated, will consider addition of low dose ARB if persistent. UA unremarkable yesterday.   LVAD Interrogation HM III: Speed: 5400  Flow: 4.2  PI: 5.1 Power: 4.4  VAD interrogated personally. Parameters stable.  Objective:   Weight Range: 77.8 kg Body mass index is 26.86 kg/m.   Vital Signs:   Temp:  [97.8 F (36.6 C)-99.1 F (37.3 C)] 98.7 F (37.1 C) (09/20 0515) Pulse Rate:  [84-91] 91 (09/20 0515) Resp:  [16-18] 16 (09/20 0515) BP: (93-101)/(66-85) 98/80 (09/20 0752) SpO2:  [93 %-96 %] 95 % (09/20 0515) Weight:  [77.8 kg] 77.8 kg (09/20 0544) Last BM Date : 10/04/22  Weight change: Filed Weights   10/04/22 0544 10/05/22 0548 10/06/22 0544  Weight: 80.5 kg 78.5 kg 77.8 kg   Intake/Output:   Intake/Output Summary (Last 24 hours) at 10/06/2022 1033 Last data filed at 10/06/2022 0531 Gross per 24 hour  Intake 240 ml  Output 1475 ml  Net -1235 ml   Physical Exam  Physical Exam: GENERAL: No acute distress. In bed.  HEENT: normal  NECK: Supple, JVP  6-7.  2+ bilaterally, no bruits.  No lymphadenopathy or thyromegaly appreciated.   CARDIAC:  Mechanical heart sounds with LVAD hum present.  LUNGS:  Clear to auscultation bilaterally.  ABDOMEN:  Soft, round, nontender, positive bowel sounds x4.     LVAD exit site: well-healed and incorporated.  Dressing dry and intact.  No erythema or drainage.  Stabilization device present and accurately applied.  Driveline dressing is being changed per protocol per sterile technique. EXTREMITIES:  Warm and dry, no cyanosis, clubbing, rash. No LE edema, LUE edema NEUROLOGIC:   Alert and oriented x 3.    No aphasia.  No dysarthria.  Affect pleasant.      Patient Profile  Charles Holmes is a 57 y.o. 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 Admitted with cardiogenic shock, stabilized and underwent HM3 LVAD. Post implant course c/b acute CVA and enterococcus faecalis bacteremia.   Assessment/Plan  1.  Acute on chronic Systolic HF-->Cardiogenic Shock  - Diagnosed 11/2019. Presented with VT. LHC 70% LAD  - cMRI 12/21 concerning for sarcoid and EF 18%.  - PET 2/22 at [facility] EF 25% + active sarcoid - Echo 08/26/20 EF < 20% severely dilated LV RV mildly decreased.  - Medtronic CRT-D upgrade in 06/08/21 - Echo 07/10/22: EF <20%, RV okay, mod pericardial effusion, mod Charles/TR - Admitted 07/25 with cardiogenic shock. - RHC: Nonobstructive CAD, severely elevated filling pressures and low Fick CO/CI (2.7/1.4) - 08/13 HM III LVAD implant + clipping LAA d/t severe thickening and invagination of mitral valve annulus impeding flows.  - Apical core sent - no mention of sarcoid - Speed increased to 5300 on 08/14.  - Echo 8/26 mod-sev RV dysfunction. Speed increased to 5400 8/26 - RHC 8/27: low filing pressures with excellent cardiac output on EPI, PAPi 4.6 - Ramp Echo 9/4, speed increased to 5400  - MAP 80s  - On Sildenafil 20 mg tid.  -Continue spironolactone 25mg   daily -Stop scheduled lasix, evaluate over weekend, may just need prn -Continue digoxin 0.125 mg daily -After INR stabilizes, consider lab work MWF given relative stability  2. HM-3 LVAD - VAD interrogated personally. Parameters stable. - LDH stable.  - Continue ASA 81 - INR 2.1 Continue warfarin.   3.  Acute stroke - Hx CVA 06/24 -Admitted 06/24 w/ R MCA stroke. S/p TPA and mechanical clot extraction. No residual deficits. Likely cardioembolic in setting of severe LV dysfunction. - Developed left sided weakness 08/14. CTA with R MCA infarct. Taken to  IR for thrombectomy - Repeat CT head with small to moderate size hemorrhagic conversion.  - repeat head CT on 8/17 w/ improved hemorrhagic CVA - Back on warfarin. - Continue ASA 81 mg daily - Appreciate CIR  4. Enterococcus faecalis bacteremia - Bcx 2/2 on 9/6 - ID consult 9/6 -> ampicillin and ceftriaxone - TEE recommended but doubt it will change management as VAD cannot be removed and will require IV abx followed by long-term suppression - Echo 09/10 - no obvious vegetations -  Repeat BCx 9/8 NGTD. - ID recommending 6 weeks IV amp/ceftriaxone followed by long-term suppression with amoxicillin. Stop date 11/04/22   5. Hx VT - ln setting of potential sarcoid heart disease  - Off amio due to tremor. Continue mexiletine, ICD in place   6. CAD - LHC 12/07/19 70-% LAD, no intervention - LHC 8/24 non obstructive CAD.  - Continue statin. On aspirin for VAD.  7. 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   8. 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 Iron deficiency anemia/ Post-op anemia - Has had IV Fe.    10. Pulmonary  - PFTs with severe obstructive defect, response to bronchodilator. FEV1 1.04L, FEV1/FVC 48% - Stable  11. Psych - Depression and delirium post VAD implant. Prolonged hospital course.  - Improving - Continue sertraline   [name] 10/06/2022

## 2022-10-06 NOTE — Progress Notes (Signed)
Occupational Therapy Note  Patient Details  Name: Charles Holmes MRN: [id number] Date of Birth: 04/23/1965  Today's Date: 10/06/2022 OT Missed Time: 60 Minutes Missed Time Reason: Patient fatigue  Pt reports fatigue declining group session at this time. Will f/u as time allows to make up for missed minutes.    Pollyann Glen [facility] 10/06/2022, 12:30 PM

## 2022-10-06 NOTE — Progress Notes (Signed)
Speech Language Pathology Daily Session Note  Patient Details  Name: Charles Holmes MRN: [id number] Date of Birth: 1965-05-04  Today's Date: 10/06/2022 SLP Individual Time: 0800-0855 SLP Individual Time Calculation (min): 55 min  Short Term Goals: Week 2: SLP Short Term Goal 1 (Week 2): Patient will attend to left field of enviornment during functional tasks with Mod I SLP Short Term Goal 2 (Week 2): Patient will utilize speech intelligibility strategies at the sentence level to achieve ~80% intelligibility with Min verbal cues. SLP Short Term Goal 3 (Week 2): Patient will recall reasoning for specific LVAD alarms with Min verbal and visual cues. SLP Short Term Goal 4 (Week 2): Patient will recall sequence to switching to LVAD batteries with Min verbal and visual cues.  Skilled Therapeutic Interventions: Skilled treatment session focused on cognitive goals. Upon arrival, patient was on bedside commode with nurse present. Patient unable to have a bowel movement with SLP offering the toilet. Patient declined and requested to get back into bed. Patient had not consumed his breakfast of regular textures yet and SLP observed patient with meal. Patient with intermittent difficulty masticating the ham in his omelet but patient declined any changes to his current diet/textures to allow for more meal options. SLP provided education regarding the importance of choosing foods that are soft and easier to masticate. He verbalized understanding. SLP also facilitated session by providing Min question cues for patient to verbally sequence how to change the LVAD from wall power to batteries and then back to wall power. Patient was ~80% intelligible at the sentence level with Min verbal cues for use of speech intelligibility strategies throughout session. Patient requested to use the bedpan to have a bowel movement. SLP offered the bedside commode and toilet multiple times with patient declining in insisting on use of  the bedpan. Nursing made aware. Patient left on bedpan with alarm on and all needs within reach. Continue with current plan of care.      Pain No/Denies Pain   Therapy/Group: Individual Therapy  Meggie Laseter 10/06/2022, 1:13 PM

## 2022-10-07 LAB — PROTIME-INR
INR: 1.8 — ABNORMAL HIGH (ref 0.8–1.2)
Prothrombin Time: 21.3 seconds — ABNORMAL HIGH (ref 11.4–15.2)

## 2022-10-07 LAB — MAGNESIUM: Magnesium: 1.9 mg/dL (ref 1.7–2.4)

## 2022-10-07 LAB — BASIC METABOLIC PANEL
Anion gap: 6 (ref 5–15)
BUN: 7 mg/dL (ref 6–20)
CO2: 28 mmol/L (ref 22–32)
Calcium: 8.3 mg/dL — ABNORMAL LOW (ref 8.9–10.3)
Chloride: 103 mmol/L (ref 98–111)
Creatinine, Ser: 0.66 mg/dL (ref 0.61–1.24)
GFR, Estimated: >60 mL/min (ref >60)
Glucose, Bld: 78 mg/dL (ref 70–99)
Potassium: 3.8 mmol/L (ref 3.5–5.1)
Sodium: 137 mmol/L (ref 135–145)

## 2022-10-07 LAB — CBC
HCT: 30.9 % — ABNORMAL LOW (ref 39.0–52.0)
Hemoglobin: 9.2 g/dL — ABNORMAL LOW (ref 13.0–17.0)
MCH: 28.2 pg (ref 26.0–34.0)
MCHC: 29.8 g/dL — ABNORMAL LOW (ref 30.0–36.0)
MCV: 94.8 fL (ref 80.0–100.0)
Platelets: 243 10*3/uL (ref 150–400)
RBC: 3.26 MIL/uL — ABNORMAL LOW (ref 4.22–5.81)
RDW: 18.4 % — ABNORMAL HIGH (ref 11.5–15.5)
WBC: 7.1 10*3/uL (ref 4.0–10.5)
nRBC: 0 % (ref 0.0–0.2)

## 2022-10-07 MED ORDER — MIRTAZAPINE 15 MG PO TABS
7.5000 mg | ORAL_TABLET | Freq: Every day | ORAL | Status: DC
  Administered 2022-10-07 – 2022-10-10 (×4): 7.5 mg via ORAL
  Filled 2022-10-07 (×4): qty 1

## 2022-10-07 MED ORDER — MAGNESIUM SULFATE 2 GM/50ML IV SOLN
2.0000 g | Freq: Once | INTRAVENOUS | Status: AC
  Administered 2022-10-07: 2 g via INTRAVENOUS
  Filled 2022-10-07: qty 50

## 2022-10-07 MED ORDER — POTASSIUM CHLORIDE CRYS ER 20 MEQ PO TBCR
20.0000 meq | EXTENDED_RELEASE_TABLET | Freq: Once | ORAL | Status: AC
  Administered 2022-10-07: 20 meq via ORAL
  Filled 2022-10-07: qty 1

## 2022-10-07 MED ORDER — WARFARIN SODIUM 2.5 MG PO TABS
5.0000 mg | ORAL_TABLET | Freq: Once | ORAL | Status: AC
  Administered 2022-10-07: 5 mg via ORAL
  Filled 2022-10-07: qty 2

## 2022-10-07 NOTE — Progress Notes (Signed)
Physical Therapy Session Note  Patient Details  Name: Charles Holmes MRN: [id number] Date of Birth: Sep 19, 1965  Today's Date: 10/07/2022 PT Individual Time: 1048-1200 PT Individual Time Calculation (min): 72 min   Short Term Goals: Week 1:  PT Short Term Goal 1 (Week 1): Pt will require min A consisntely for bed mobility PT Short Term Goal 1 - Progress (Week 1): Met PT Short Term Goal 2 (Week 1): Pt will require mod A with sit to stand with LRAD PT Short Term Goal 2 - Progress (Week 1): Met PT Short Term Goal 3 (Week 1): Pt will require max A x 1 for gait x 10 ft with LRAD PT Short Term Goal 3 - Progress (Week 1): Met Week 2:  PT Short Term Goal 1 (Week 2): Pt will require consistent supervision for bed mobility. PT Short Term Goal 2 (Week 2): Pt will require consistent CGA for sit to stand with LRAD. PT Short Term Goal 3 (Week 2): Pt will require supervision for ambulation >100 ft with LRAD and no rest breaks.  Skilled Therapeutic Interventions/Progress Updates:  Patient supine in bed and asleep on entrance to room. Patient alert and agreeable to PT session.   Patient with no pain complaint at start of session. Still endorses frustration with LUE swelling and decreased active movement. However, pt with improved motor control demonstrated from a week ago.   Note appreciated with presence of DVT in L cephalic vein. Pt with no new orders for reduced mobility with nursing confirmation and prepped for session.   While TED hose donned in supine, pt relates need to urinate. Urinal used for time. Positioned with MinA and held in place for continent void.   Pt unable to complete disconnection and reconnection of LVAD leads without ModA d/t decreased grip strength of L hand. Pt is able to relate most steps required to successfully switch from base power unit to battery power including donning shoulder harness and putting back up batteries in carry case. Performed Mod/ MaxA overall with this  therapist making all connections.   Therapeutic Activity: Bed Mobility: Pt able to perform bridging in hooklying in order to don brief and shorts with MaxA. Pt performed supine <> sit to R side of bed with supervision overall and consistent vc for proprioception of LUE throughout.  Transfers: Pt performed sit<>stand and stand pivot transfers throughout session with close supervision/ CGA. Provided verbal cues for hip hinge to produce anterior weight shift instead of trunk flexion for improved performance, decreased intraabdominal pressure and decreased need for momentum build.   Gait Training:  Pt ambulated ~120' x2 using IV pole for min steadying with close supervision/ CGA. Demonstrated adequate step height and consistent pacing bilaterally.  Pt ambulates to NuStep and guided in continuous reciprocation of BUE only initially with focus on LUE push/ pull, then with focus on L hand grasp, then focus on BLE only with focus on full extension bilaterally for . Final with focus on whole body performance and L hand grasp. Requires up to Mod A and constant vc for maintaining grasp. Average of 1.5 METs throughout for 311 total steps.   Neuromuscular Re-ed: NMR facilitated during session with focus on motor control and balance. Pt guided in dynamic step to target with use of LUE. Pt requires Min/ ModA for full reach with LUE to targets as well as for pull of Squigz from surface. NMR performed for improvements in motor control and coordination, balance, sequencing, judgement, and self  confidence/ efficacy in performing all aspects of mobility at highest level of independence.   Patient supine in bed with bed maneuvered into chair position to set up for lunch arrival at end of session with brakes locked, bed alarm set, and all needs within reach.  Therapy Documentation Precautions:  Precautions Precautions: Sternal, Fall, Other (comment) Precaution Comments: LVAD Restrictions Weight  Bearing Restrictions: No RUE Weight Bearing:  (sternal precautions) LUE Weight Bearing:  (sternal precautions) RLE Weight Bearing: Weight bearing as tolerated Other Position/Activity Restrictions: Sternal Precautions  Pain:  No pain related this session. Discomfort noted with squeeze of L hand into fist d/t increased swelling.   Therapy/Group: Individual Therapy  Loel Dubonnet PT, DPT, CSRS 10/07/2022, 8:05 AM

## 2022-10-07 NOTE — Progress Notes (Signed)
Speech Language Pathology Daily Session Note  Patient Details  Name: Charles Holmes MRN: [id number] Date of Birth: May 28, 1965  Today's Date: 10/07/2022 SLP Individual Time: 0730-0821 SLP Individual Time Calculation (min): 51 min  Short Term Goals: Week 2: SLP Short Term Goal 1 (Week 2): Patient will attend to left field of enviornment during functional tasks with Mod I SLP Short Term Goal 2 (Week 2): Patient will utilize speech intelligibility strategies at the sentence level to achieve ~80% intelligibility with Min verbal cues. SLP Short Term Goal 3 (Week 2): Patient will recall reasoning for specific LVAD alarms with Min verbal and visual cues. SLP Short Term Goal 4 (Week 2): Patient will recall sequence to switching to LVAD batteries with Min verbal and visual cues.  Skilled Therapeutic Interventions:   Patient seen earlier than scheduled this AM due to cancellation. Skilled therapy session foused on speech intelligibility and cognitive goals. SLP faciliated session by reviewing speech intelligibility goals and encouraged patient to utilize strategies during conversation. Patient and SLP discussed LVAD device, its purpose, and how to use it. Patient able to appropriately and accurately respond to all questions with supervisionA. Patient was ~80% intelligibility during conversation with use of strategies. Of note, patient consumed regular textured breakfast tray during during session. Patient reports choosing items that are soft and easy to chew. No s/sx of aspiration throughout.  Continue regular diet and supervision level. Patient left in bed with alarm set and call bell in reach. Continue POC.   Pain No pain reports   Therapy/Group: Individual Therapy  Keiland Pickering M.A., CF-SLP 10/07/2022, 8:22 AM

## 2022-10-07 NOTE — Plan of Care (Signed)
  Problem: Consults Goal: RH STROKE PATIENT EDUCATION Description: See Patient Education module for education specifics  Outcome: Progressing   Problem: RH BOWEL ELIMINATION Goal: RH STG MANAGE BOWEL W/MEDICATION W/ASSISTANCE Description: STG Manage Bowel with Medication with min Assistance. Outcome: Progressing   Problem: RH BLADDER ELIMINATION Goal: RH STG MANAGE BLADDER WITH ASSISTANCE Description: STG Manage Bladder With min Assistance Outcome: Progressing

## 2022-10-07 NOTE — Progress Notes (Signed)
Patient ID: Charles Holmes, male   DOB: 12-14-1965, 56 y.o.   MRN: [id number]     Advanced Heart Failure Rounding Note  PCP-Cardiologist: Charles Herrlich, MD  [facility]: Dr. Gala Holmes   Subjective:    Admitted to CIR on 09/11  INR 1.8  Lying in bed. Feels like he is getting stronger with PT/OT.  Denies CP or SOB.   LVAD Interrogation HM III: Speed: 5400  Flow: 4.6  PI: 3.5 Power: 4.0  VAD interrogated personally. Parameters stable.  Objective:   Weight Range: 76.4 kg Body mass index is 26.38 kg/m.   Vital Signs:   Temp:  [98.4 F (36.9 C)-98.8 F (37.1 C)] 98.8 F (37.1 C) (09/21 0458) Pulse Rate:  [74-90] 74 (09/21 0458) Resp:  [18] 18 (09/21 0458) BP: (93-112)/(80-97) 102/86 (09/21 0800) SpO2:  [94 %-96 %] 95 % (09/21 0900) Weight:  [76.4 kg] 76.4 kg (09/21 0500) Last BM Date : 10/06/22  Weight change: Filed Weights   10/05/22 0548 10/06/22 0544 10/07/22 0500  Weight: 78.5 kg 77.8 kg 76.4 kg   Intake/Output:   Intake/Output Summary (Last 24 hours) at 10/07/2022 1244 Last data filed at 10/07/2022 0501 Gross per 24 hour  Intake 360 ml  Output 1600 ml  Net -1240 ml   Physical Exam   General:  NAD.  HEENT: normal  Neck: supple. JVP not elevated.  Carotids 2+ bilat; no bruits. No lymphadenopathy or thryomegaly appreciated. Cor: LVAD hum.  Lungs: Clear. Abdomen: obese soft, nontender, non-distended. No hepatosplenomegaly. No bruits or masses. Good bowel sounds. Driveline site clean. Anchor in place.  Extremities: no cyanosis, clubbing, rash. Warm no edema  Neuro: alert & oriented x 3. Weak on left     Patient Profile  [name] is a 57 y.o. 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 Admitted with cardiogenic shock, stabilized and underwent HM3 LVAD. Post implant course c/b acute CVA and enterococcus faecalis bacteremia.   Assessment/Plan  1.  Acute on chronic Systolic HF-->Cardiogenic Shock  -  Diagnosed 11/2019. Presented with VT. LHC 70% LAD  - cMRI 12/21 concerning for sarcoid and EF 18%.  - PET 2/22 at [facility] EF 25% + active sarcoid - [employer] CRT-D upgrade in 06/08/21 - Echo 07/10/22: EF <20%, RV okay, mod pericardial effusion, mod MR/TR - Admitted 07/25 with cardiogenic shock. - RHC: Nonobstructive CAD, severely elevated filling pressures and low Fick CO/CI (2.7/1.4) - 08/13 HM III LVAD implant + clipping LAA d/t severe thickening and invagination of mitral valve annulus impeding flows.  - Apical core sent - no mention of sarcoid - Echo 8/26 mod-sev RV dysfunction. Speed increased to 5400 8/26 - Ramp Echo 9/4, speed increased to 5400  - MAP 70s. Volume status looks good off lasix.  - On Sildenafil 20 mg tid.  -Continue spironolactone 25mg  daily -Continue digoxin 0.125 mg daily -After INR stabilizes, consider lab work MWF given relative stability  2. HM-3 LVAD - VAD interrogated personally. Parameters stable. - LDH stable - Continue ASA 81 - INR 1.8 Continue warfarin. Discussed dosing with PharmD personally.  3.  Acute stroke - Hx CVA 06/24 -Admitted 06/24 w/ R MCA stroke. S/p TPA and mechanical clot extraction. No residual deficits. Likely cardioembolic in setting of severe LV dysfunction. - Developed left sided weakness 08/14. CTA with R MCA infarct. Taken to IR for thrombectomy - Repeat CT head with small to moderate size hemorrhagic conversion.  - repeat head  CT on 8/17 w/ improved hemorrhagic CVA - Back on warfarin.  - Functional deficits improving - Continue ASA 81 mg daily - Appreciate CIR  4. Enterococcus faecalis bacteremia - Bcx 2/2 on 9/6 - ID consult 9/6 -> ampicillin and ceftriaxone - TEE recommended but doubt it will change management as VAD cannot be removed and will require IV abx followed by long-term suppression - Echo 09/10 - no obvious vegetations -  Repeat BCx 9/8 NGTD. - ID recommending 6 weeks IV amp/ceftriaxone followed by long-term  suppression with amoxicillin. Stop date 11/04/22   5. Hx VT - ln setting of potential sarcoid heart disease  - Off amio due to tremor. Continue mexiletine, ICD in place   6. CAD - LHC 12/07/19 70-% LAD, no intervention - LHC 8/24 non obstructive CAD.  - Continue statin. On aspirin for VAD. No s/s angina  7. 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   8. 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 Iron deficiency anemia/ Post-op anemia - Has had IV Fe.    10. Pulmonary  - PFTs with severe obstructive defect, response to bronchodilator. FEV1 1.04L, FEV1/FVC 48% - Stable  11. Psych - Depression and delirium post VAD implant. Prolonged hospital course.  - Improving - Continue sertraline   Charles Holmes 10/07/2022

## 2022-10-07 NOTE — Progress Notes (Signed)
ANTICOAGULATION CONSULT NOTE - Follow-up  Pharmacy Consult for warfarin Indication:  LVAD  Allergies  Allergen Reactions   Pacerone [Amiodarone] Other (See Comments)    Severe tremors   Percocet [Oxycodone-Acetaminophen] Itching    Patient Measurements: Height: 5\' 7"  (170.2 cm) Weight: 76.4 kg (168 lb 6.9 oz) IBW/kg (Calculated) : 66.1 Heparin Dosing Weight: 87kg  Vital Signs: Temp: 98.8 F (37.1 C) (09/21 0458) Temp Source: Oral (09/21 0458) BP: 109/97 (09/21 0458) Pulse Rate: 74 (09/21 0458)  Labs: Recent Labs    10/05/22 0307 10/06/22 0409 10/06/22 1005 10/07/22 0339  HGB  --   --  9.8* 9.2*  HCT  --   --  34.2* 30.9*  PLT  --   --  296 243  LABPROT 20.3* 23.4*  --  21.3*  INR 1.7* 2.1*  --  1.8*  CREATININE 0.62  --  0.62 0.66    Estimated Creatinine Clearance: 93 mL/min (by C-G formula based on SCr of 0.66 mg/dL).   Medical History: Past Medical History:  Diagnosis Date   CAD (coronary artery disease)    CHF (congestive heart failure) (HCC)    COPD (chronic obstructive pulmonary disease) (HCC)    GERD (gastroesophageal reflux disease)    Hyperlipidemia    Hypertension    LVAD (left ventricular assist device) present (HCC)    Stroke (HCC)    Systolic heart failure (HCC) 2021   LVEF 18%, RVEF 38% on cardiac MRI 12/19/2019. possible cardiac sarcoidosis.   Wide-complex tachycardia 2021   wears LifeVest     Assessment: 57 yoM on apixaban PTA for hx AF admitted for LVAD workup. Pt s/p HM3 implant on 8/13 c/b acute CVA postop, on aspirin 81 mg daily. Started low dose heparin + warfarin started 8/19. Low-dose heparin was stopped 8/31.  INR subtherapeutic at 1.8, looks like dose missed 9/19. CBC stable.   Goal of Therapy:  INR 2-2.5 Monitor platelets by anticoagulation protocol: Yes   Plan:  Warfarin 5mg  x1 Monitor daily INR, CBC   Fredonia Highland, PharmD, BCPS, [facility] Clinical Pharmacist [phone] Please check AMION for all [facility] Pharmacy  numbers 10/07/2022

## 2022-10-07 NOTE — Progress Notes (Signed)
PROGRESS NOTE   Subjective/Complaints:  Pt doing well, slept ok-- off and on-- "could be better". Isn't sure why mirtazepine wasn't restarted but would be agreeable to try and see if this helps his sleep.  Denies pain right now.  LBM yesterday.  Urinating alright, no longer having dysuria.  Denies any other complaints or concerns.   ROS:   Pt denies SOB, abd pain, CP, N/V/C/D, and vision changes  Objective:   VAS [place] UPPER EXTREMITY VENOUS DUPLEX  Result Date: 10/07/2022 UPPER VENOUS STUDY  Patient Name:  NICHALOS KOSUB  Date of Exam:   10/06/2022 Medical Rec #: [id number]        Accession #:    [id number] Date of Birth: 1965/03/04        Patient Gender: M Patient Age:   57 years Exam Location:  [facility] Procedure:      VAS [place] UPPER EXTREMITY VENOUS DUPLEX Referring Phys: [name] VANTRIGT --------------------------------------------------------------------------------  Indications: Edema Risk Factors: Immobility. Anticoagulation: Coumadin. Comparison Study: No prior study Performing Technologist: Shona Simpson  Examination Guidelines: A complete evaluation includes B-mode imaging, spectral Doppler, color Doppler, and power Doppler as needed of all accessible portions of each vessel. Bilateral testing is considered an integral part of a complete examination. Limited examinations for reoccurring indications may be performed as noted.  Right Findings: +----------+------------+---------+-----------+----------+-------+ RIGHT     CompressiblePhasicitySpontaneousPropertiesSummary +----------+------------+---------+-----------+----------+-------+ Subclavian    Full       Yes       Yes                      +----------+------------+---------+-----------+----------+-------+  Left Findings: +----------+------------+---------+-----------+--------------------+-------+ LEFT      CompressiblePhasicitySpontaneous     Properties      Summary +----------+------------+---------+-----------+--------------------+-------+ IJV           Full       Yes       No                                 +----------+------------+---------+-----------+--------------------+-------+ Subclavian    Full       Yes       Yes                                +----------+------------+---------+-----------+--------------------+-------+ Axillary      Full       Yes       Yes                                +----------+------------+---------+-----------+--------------------+-------+ Brachial      Full       Yes       Yes                                +----------+------------+---------+-----------+--------------------+-------+ Radial        Full       Yes       Yes                                +----------+------------+---------+-----------+--------------------+-------+  Ulnar         Full       Yes       Yes                                +----------+------------+---------+-----------+--------------------+-------+ Cephalic      None       No        No     spongy w/compressionChronic +----------+------------+---------+-----------+--------------------+-------+ Basilic       Full       Yes       Yes                                +----------+------------+---------+-----------+--------------------+-------+ 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  Left: No evidence of deep vein thrombosis in the upper extremity. 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  *See table(s) above for measurements and observations.  Diagnosing physician: Coral Else MD Electronically signed by Coral Else MD on 10/07/2022 at 10:07:16 AM.    Final    Recent Labs    10/06/22 1005 10/07/22 0339  WBC 8.2 7.1  HGB 9.8* 9.2*  HCT 34.2* 30.9*  PLT 296 243     Recent Labs     10/06/22 1005 10/07/22 0339  NA 137 137  K 4.0 3.8  CL 104 103  CO2 28 28  GLUCOSE 102* 78  BUN 6 7  CREATININE 0.62 0.66  CALCIUM 8.3* 8.3*    Intake/Output Summary (Last 24 hours) at 10/07/2022 1251 Last data filed at 10/07/2022 0501 Gross per 24 hour  Intake 360 ml  Output 1600 ml  Net -1240 ml        Physical Exam: Vital Signs Blood pressure 102/86, pulse 74, temperature 98.8 F (37.1 C), temperature source Oral, resp. rate 18, height 5\' 7"  (1.702 m), weight 76.4 kg, SpO2 95%.   General: awake, alert, appropriate,  laying in bed; NAD HENT: conjugate gaze; oropharynx moist CV: LVAD hum-strong, trace BLE edema but better than before Pulmonary: CTA B/L; no W/R/R, no increased WOB GI: soft, NT, ND, (+)BS- normoactive Psychiatric: appropriate- a little depressed, flat Neurological: Ox3 Extremities; LUE somewhat swollen- likely from stroke- elevated on pillow Skin: chest incisions clean healing well , LVAD drive site clean/dressed  PRIOR EXAMS: Neuro:  Alert and oriented x 3. Fair insight and awareness. Intact Memory. Normal language and speech. Cranial nerve exam unremarkable except for mild left central 7. MMT: LUE delt, bi, tri, grip   3/5 prox to distal. LLE 4-/5 throughout. Pt with intact  LT sense on left but feels a little different compared to R side . No abnl resting tone. DTR's tr to1+.   Musculoskeletal: Full ROM, No pain with AROM or PROM in the neck, trunk, or extremities. Posture appropriate     Assessment/Plan: 1. Functional deficits which require 3+ hours per day of interdisciplinary therapy in a comprehensive inpatient rehab setting. Physiatrist is providing close team supervision and 24 hour management of active medical problems listed below. Physiatrist and rehab team continue to assess barriers to discharge/monitor patient progress toward functional and medical goals  Care Tool:  Bathing    Body parts bathed by patient: Chest, Left arm, Abdomen,  Front perineal area, Face   Body parts bathed  by helper: Buttocks, Right upper leg, Left upper leg, Right lower leg, Left lower leg, Right arm     Bathing assist Assist Level: Maximal Assistance - Patient 24 - 49%     Upper Body Dressing/Undressing Upper body dressing   What is the patient wearing?: Pull over shirt    Upper body assist Assist Level: Maximal Assistance - Patient 25 - 49%    Lower Body Dressing/Undressing Lower body dressing      What is the patient wearing?: Pants     Lower body assist Assist for lower body dressing: Minimal Assistance - Patient > 75%     Toileting Toileting    Toileting assist Assist for toileting: Maximal Assistance - Patient 25 - 49%     Transfers Chair/bed transfer  Transfers assist  Chair/bed transfer activity did not occur: Safety/medical concerns (fatigue)  Chair/bed transfer assist level: Contact Guard/Touching assist     Locomotion Ambulation   Ambulation assist   Ambulation activity did not occur: Safety/medical concerns (fatigue)  Assist level: Contact Guard/Touching assist Assistive device: Walker-rolling Max distance: 150'   Walk 10 feet activity   Assist  Walk 10 feet activity did not occur: Safety/medical concerns (fatigue)  Assist level: Contact Guard/Touching assist Assistive device: Walker-rolling   Walk 50 feet activity   Assist Walk 50 feet with 2 turns activity did not occur: Safety/medical concerns  Assist level: Contact Guard/Touching assist Assistive device: Walker-rolling    Walk 150 feet activity   Assist Walk 150 feet activity did not occur: Safety/medical concerns  Assist level: Contact Guard/Touching assist Assistive device: Walker-rolling    Walk 10 feet on uneven surface  activity   Assist Walk 10 feet on uneven surfaces activity did not occur: Safety/medical concerns (fatigue)         Wheelchair     Assist Is the patient using a wheelchair?: Yes Type of  Wheelchair: Manual Wheelchair activity did not occur: Safety/medical concerns (fatigue)         Wheelchair 50 feet with 2 turns activity    Assist    Wheelchair 50 feet with 2 turns activity did not occur: Safety/medical concerns       Wheelchair 150 feet activity     Assist  Wheelchair 150 feet activity did not occur: Safety/medical concerns       Blood pressure 102/86, pulse 74, temperature 98.8 F (37.1 C), temperature source Oral, resp. rate 18, height 5\' 7"  (1.702 m), weight 76.4 kg, SpO2 95%.  Medical Problem List and Plan: 1. Functional deficits secondary to acute right MCA infarct/ LVAD pt             -patient may shower             -ELOS/Goals: modI/S 10/17/22   -Con't CIR PT, OT and SLP   2.  Antithrombotics: -DVT/anticoagulation:  Pharmaceutical: Coumadin per pharmacy PT therapeutic 9/20- 2.1 Has been subtherapeutic for the last 3 d in 1.7-1.8 range               -antiplatelet therapy: Aspirin 81 mg daily   3. Pain Management: Tylenol and Tramadol as needed   4. Mood/Behavior/Sleep: delirium/insomnia - team to provide ongoing ego support             -continue mirtazapine 7.5 mg q HS--reordered 10/07/22 d/t poor sleep             -continue sertraline 50 mg daily             -continue  melatonin 3 mg q HS -Started Trazodone 50mg  at bedtime on 09/29/22             -antipsychotic agents: n/a -9/13- mood somewhat better than when saw pt Tuesday- con't regimen and monitor -09/30/22 not sleeping well, increased Melatonin to 5mg  QHS; if not helping, may consider restarting mirtazapine as above 9/18 poor sleep related to freq urination related to lasix , intake of fluids is minimal may need to use comp hose to prvent accumulation during the day and prevent nocturnal diuresis  -10/07/22 mirtazepine restarted 7.5mg  QHS 5. Neuropsych/cognition: This patient is capable of making decisions on his own behalf.   6. Skin/Wound Care: Routine skin care checks   7.  Fluids/Electrolytes/Nutrition: strict Is and Os and follow-up chemistries             -poor PO intake on Marinol             -continue supplements   -9/12 labs reviewed. low albumin still continue interventions above  8: Hypotension but difficult to assess due to LVAD    9/13- BP soft, but denies orthostatic hypotension -9/14-15/24 BPs generally stable/improving; was previously on midodrine, but per HF team pt was stable off it;  Difficult to measure BP due to LVAD as well as LUE edema post CVA -10/07/22 BPs good, cont monitoring Vitals:   10/05/22 0800 10/05/22 1512 10/05/22 2115 10/06/22 0115  BP: 99/77 93/80 99/71  101/85   10/06/22 0515 10/06/22 0752 10/06/22 1039 10/06/22 1244  BP: 99/66 98/80 (!) 87/76 93/81   10/06/22 2013 10/07/22 0021 10/07/22 0458 10/07/22 0800  BP: 112/82 93/80 (!) 109/97 102/86     9: Hyperlipidemia: continue atorvastatin 80mg  daily   10: Enterococcus bacteremia: 6 weeks IV antibiotic, ceftriaxone and ampicillin last dose 10/19 will go home with PICC and HH             -follow-up with ID  -09/30/22 PICC placed this AM  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: Home meds: Coreg 3.125, Lasix 40mg , Lostartan 25mg , Aldactone 12.5, Farxiga 10mg , mexilitene --holding all currently   12: Acute on chronic systolic heart failure with cardiogenic shock s/p LVAD             -daily weights balanced so far             -continue digoxin 0.125 mg daily             -continue mexiletine 250 mg BID -continue midodrine 5 mg q 8 hours--not ordered, per HF team on 9/13 pt was stable off Midodrine; hold for now             -continue sildenafil 20 mg q 8 hours             -continue warfarin per pharmacy Managed by Heart Failure team - coordinating with primary service for activity tolerance monitoring  Filed Weights   10/05/22 0548 10/06/22 0544 10/07/22 0500  Weight: 78.5 kg 77.8 kg 76.4 kg    13: Hx of VT: continue mexiletine, digoxin; has ICD   14: DM-2: A1c = 5.5%; monitor PO intake;  DC SSI (Farxiga at home)  15: OSA: needs outpatient sleep study   16: Anemia, iron deficiency: follow-up CBC--most recent hgb 9.5--lab pending still today?  -10/07/22 Hgb stable 9.2, monitor    Latest Ref Rng & Units 10/07/2022    3:39 AM 10/06/2022   10:05 AM 10/02/2022    2:36 AM  CBC  WBC 4.0 - 10.5 K/uL 7.1  8.2  7.8  Hemoglobin 13.0 - 17.0 g/dL 9.2  9.8  8.7   Hematocrit 39.0 - 52.0 % 30.9  34.2  30.7   Platelets 150 - 400 K/uL 243  296  235       17: Severe COPD:              -continue Ellipta one puff daily   18: S/p tooth extractions this admission: monitor PO tolerance   19: Glaucoma: continue Cosopt and Xalatan   20.  Candidiasis in groin start nystatin powder   21.  Dysuria- UA neg , on broad spectrum IV abx for bacteremia-- resolved 10/07/22  22.  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 Shouldn't need any adjustments. Awaiting cards note; appreciate assistance.     LOS: 10 days A FACE TO FACE EVALUATION WAS PERFORMED  [address] 10/07/2022, 12:51 PM

## 2022-10-08 LAB — BASIC METABOLIC PANEL
Anion gap: 10 (ref 5–15)
BUN: 9 mg/dL (ref 6–20)
CO2: 29 mmol/L (ref 22–32)
Calcium: 8.8 mg/dL — ABNORMAL LOW (ref 8.9–10.3)
Chloride: 102 mmol/L (ref 98–111)
Creatinine, Ser: 0.7 mg/dL (ref 0.61–1.24)
GFR, Estimated: >60 mL/min (ref >60)
Glucose, Bld: 83 mg/dL (ref 70–99)
Potassium: 4 mmol/L (ref 3.5–5.1)
Sodium: 141 mmol/L (ref 135–145)

## 2022-10-08 LAB — PROTIME-INR
INR: 1.9 — ABNORMAL HIGH (ref 0.8–1.2)
Prothrombin Time: 22.4 seconds — ABNORMAL HIGH (ref 11.4–15.2)

## 2022-10-08 LAB — CBC
HCT: 32.2 % — ABNORMAL LOW (ref 39.0–52.0)
Hemoglobin: 9.3 g/dL — ABNORMAL LOW (ref 13.0–17.0)
MCH: 27.4 pg (ref 26.0–34.0)
MCHC: 28.9 g/dL — ABNORMAL LOW (ref 30.0–36.0)
MCV: 95 fL (ref 80.0–100.0)
Platelets: 263 10*3/uL (ref 150–400)
RBC: 3.39 MIL/uL — ABNORMAL LOW (ref 4.22–5.81)
RDW: 18.3 % — ABNORMAL HIGH (ref 11.5–15.5)
WBC: 6.7 10*3/uL (ref 4.0–10.5)
nRBC: 0 % (ref 0.0–0.2)

## 2022-10-08 LAB — MAGNESIUM: Magnesium: 1.9 mg/dL (ref 1.7–2.4)

## 2022-10-08 MED ORDER — WARFARIN SODIUM 3 MG PO TABS
3.0000 mg | ORAL_TABLET | Freq: Once | ORAL | Status: AC
  Administered 2022-10-08: 3 mg via ORAL
  Filled 2022-10-08: qty 1

## 2022-10-08 MED ORDER — MAGNESIUM SULFATE 2 GM/50ML IV SOLN
2.0000 g | Freq: Once | INTRAVENOUS | Status: AC
  Administered 2022-10-08: 2 g via INTRAVENOUS
  Filled 2022-10-08: qty 50

## 2022-10-08 NOTE — Progress Notes (Signed)
Patient ID: Charles Holmes, male   DOB: Aug 10, 1965, 57 y.o.   MRN: [id number]     Advanced Heart Failure Rounding Note  PCP-Cardiologist: Norman Herrlich, MD  [facility]: Dr. Gala Romney   Subjective:    Admitted to CIR on 09/11  INR 1.9  Lying in bed. Feels like he is getting stronger with PT/OT.  Denies CP or SOB.   LVAD Interrogation HM III: Speed: 5400  Flow: 4.8  PI: 2.8 Power: 3.9  VAD interrogated personally. Parameters stable.  Objective:   Weight Range: 76.1 kg Body mass index is 26.28 kg/m.   Vital Signs:   Temp:  [98.2 F (36.8 C)-98.8 F (37.1 C)] 98.4 F (36.9 C) (09/22 0419) Pulse Rate:  [69-89] 76 (09/22 0742) Resp:  [16-18] 18 (09/22 0742) BP: (92-111)/(67-98) 98/68 (09/22 0800) SpO2:  [96 %-98 %] 98 % (09/22 0742) Weight:  [76.1 kg] 76.1 kg (09/22 0419) Last BM Date : 10/07/22  Weight change: Filed Weights   10/06/22 0544 10/07/22 0500 10/08/22 0419  Weight: 77.8 kg 76.4 kg 76.1 kg   Intake/Output:   Intake/Output Summary (Last 24 hours) at 10/08/2022 1143 Last data filed at 10/08/2022 0715 Gross per 24 hour  Intake 595 ml  Output 1425 ml  Net -830 ml   Scheduled Meds:  aspirin EC  81 mg Oral Daily   atorvastatin  80 mg Oral Daily   Chlorhexidine Gluconate Cloth  6 each Topical BID   digoxin  0.125 mg Oral Daily   dorzolamide-timolol  1 drop Both Eyes BID   Gerhardt's butt cream   Topical TID   influenza vac split trivalent PF  0.5 mL Intramuscular Tomorrow-1000   latanoprost  1 drop Both Eyes QHS   melatonin  5 mg Oral QHS   mexiletine  250 mg Oral BID   mirtazapine  7.5 mg Oral QHS   mometasone-formoterol  2 puff Inhalation BID   multivitamin with minerals  1 tablet Oral Daily   nystatin   Topical BID   pantoprazole  40 mg Oral Daily   sertraline  50 mg Oral Daily   sildenafil  20 mg Oral TID   spironolactone  25 mg Oral Daily   traZODone  50 mg Oral QHS   umeclidinium bromide  1 puff Inhalation Daily   warfarin  3 mg Oral ONCE-1600    Warfarin - Pharmacist Dosing Inpatient   Does not apply q1600   Continuous Infusions:  sodium chloride 250 mL (10/01/22 1934)   ampicillin (OMNIPEN) IV 2 g (10/08/22 0908)   cefTRIAXone (ROCEPHIN)  IV 2 g (10/08/22 0822)   PRN Meds:.sodium chloride, acetaminophen, albuterol, alum & mag hydroxide-simeth, bisacodyl, guaiFENesin-dextromethorphan, ondansetron **OR** ondansetron (ZOFRAN) IV, mouth rinse, polyethylene glycol, sodium chloride, sodium chloride flush, sodium chloride flush, sodium phosphate, traMADol   Physical Exam   General: Well appearing this am. NAD.  HEENT: Normal. Neck: Supple, JVP 7-8 cm. Carotids OK.  Cardiac:  Mechanical heart sounds with LVAD hum present.  Lungs:  CTAB, normal effort.  Abdomen:  NT, ND, no HSM. No bruits or masses. +BS  LVAD exit site: Well-healed and incorporated. Dressing dry and intact. No erythema or drainage. Stabilization device present and accurately applied. Driveline dressing changed daily per sterile technique. Extremities:  Left arm swelling Neuro:  Weak on left side.    Patient Profile  [name] is a 57 y.o. 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 Admitted with  cardiogenic shock, stabilized and underwent HM3 LVAD. Post implant course c/b acute CVA and enterococcus faecalis bacteremia.   Assessment/Plan  1.  Acute on chronic Systolic HF-->Cardiogenic Shock  - Diagnosed 11/2019. Presented with VT. LHC 70% LAD  - cMRI 12/21 concerning for sarcoid and EF 18%.  - PET 2/22 at [facility] EF 25% + active sarcoid - [employer] CRT-D upgrade in 06/08/21 - Echo 07/10/22: EF <20%, RV okay, mod pericardial effusion, mod MR/TR - Admitted 07/25 with cardiogenic shock. - RHC: Nonobstructive CAD, severely elevated filling pressures and low Fick CO/CI (2.7/1.4) - 08/13 HM III LVAD implant + clipping LAA d/t severe thickening and invagination of mitral valve annulus impeding flows.  - Apical  core sent - no mention of sarcoid - Echo 8/26 mod-sev RV dysfunction. Speed increased to 5400 8/26 - Ramp Echo 9/4, speed increased to 5400  - MAP 70s. Volume status looks good off lasix.  - On Sildenafil 20 mg tid.  - Continue spironolactone 25mg  daily - Continue digoxin 0.125 mg daily - After INR stabilizes, consider lab work MWF given relative stability  2. HM-3 LVAD - VAD interrogated personally. Parameters stable. - LDH stable - Continue ASA 81 - INR 1.9 Continue warfarin. Discussed dosing with PharmD personally.  3.  Acute stroke - Hx CVA 06/24 -Admitted 06/24 w/ R MCA stroke. S/p TPA and mechanical clot extraction. No residual deficits. Likely cardioembolic in setting of severe LV dysfunction. - Developed left sided weakness 08/14. CTA with R MCA infarct. Taken to IR for thrombectomy - Repeat CT head with small to moderate size hemorrhagic conversion.  - repeat head CT on 8/17 w/ improved hemorrhagic CVA - Back on warfarin.  - Functional deficits improving - Continue ASA 81 mg daily - Appreciate CIR  4. Enterococcus faecalis bacteremia - Bcx 2/2 on 9/6 - ID consult 9/6 -> ampicillin and ceftriaxone - TEE recommended but doubt it will change management as VAD cannot be removed and will require IV abx followed by long-term suppression - Echo 09/10 - no obvious vegetations -  Repeat BCx 9/8 NGTD. - ID recommending 6 weeks IV amp/ceftriaxone followed by long-term suppression with amoxicillin. Stop date 11/04/22   5. Hx VT - ln setting of potential sarcoid heart disease  - Off amio due to tremor. Continue mexiletine, ICD in place   6. CAD - LHC 12/07/19 70-% LAD, no intervention - LHC 8/24 non obstructive CAD.  - Continue statin. On aspirin for VAD. No s/s angina  7. 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   8. 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 Iron deficiency anemia/ Post-op anemia - Has had IV Fe.    10. Pulmonary  - PFTs with severe obstructive defect, response to bronchodilator. FEV1 1.04L, FEV1/FVC 48% - Stable  11. Psych - Depression and delirium post VAD implant. Prolonged hospital course.  - Improving - Continue sertraline  12. Left arm swelling - 9/20 LUE dopplers with chronic superficial vein thrombosis.    Marca Ancona 10/08/2022

## 2022-10-08 NOTE — Progress Notes (Signed)
ANTICOAGULATION CONSULT NOTE - Follow-up  Pharmacy Consult for warfarin Indication:  LVAD  Allergies  Allergen Reactions   Pacerone [Amiodarone] Other (See Comments)    Severe tremors   Percocet [Oxycodone-Acetaminophen] Itching    Patient Measurements: Height: 5\' 7"  (170.2 cm) Weight: 76.1 kg (167 lb 12.3 oz) IBW/kg (Calculated) : 66.1 Heparin Dosing Weight: 87kg  Vital Signs: Temp: 98.4 F (36.9 C) (09/22 0419) Temp Source: Oral (09/22 0419) BP: 111/98 (09/22 0419) Pulse Rate: 89 (09/22 0419)  Labs: Recent Labs    10/06/22 0409 10/06/22 1005 10/06/22 1005 10/07/22 0339 10/08/22 0435  HGB  --  9.8*   < > 9.2* 9.3*  HCT  --  34.2*  --  30.9* 32.2*  PLT  --  296  --  243 263  LABPROT 23.4*  --   --  21.3* 22.4*  INR 2.1*  --   --  1.8* 1.9*  CREATININE  --  0.62  --  0.66 0.70   < > = values in this interval not displayed.    Estimated Creatinine Clearance: 93 mL/min (by C-G formula based on SCr of 0.7 mg/dL).   Medical History: Past Medical History:  Diagnosis Date   CAD (coronary artery disease)    CHF (congestive heart failure) (HCC)    COPD (chronic obstructive pulmonary disease) (HCC)    GERD (gastroesophageal reflux disease)    Hyperlipidemia    Hypertension    LVAD (left ventricular assist device) present (HCC)    Stroke (HCC)    Systolic heart failure (HCC) 2021   LVEF 18%, RVEF 38% on cardiac MRI 12/19/2019. possible cardiac sarcoidosis.   Wide-complex tachycardia 2021   wears LifeVest     Assessment: 57 yoM on apixaban PTA for hx AF admitted for LVAD workup. Pt s/p HM3 implant on 8/13 c/b acute CVA postop, on aspirin 81 mg daily. Started low dose heparin + warfarin started 8/19. Low-dose heparin was stopped 8/31.  INR subtherapeutic at 1.9 but trending up, looks like dose missed 9/19. CBC stable. 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   Goal of Therapy:  INR 2-2.5 Monitor platelets by anticoagulation protocol: Yes    Plan:  Warfarin 3mg  x1 Monitor daily INR, CBC   Fredonia Highland, PharmD, BCPS, [facility] Clinical Pharmacist [phone] Please check AMION for all [facility] numbers 10/08/2022

## 2022-10-08 NOTE — Progress Notes (Cosign Needed)
Assessment done at 1330. Patient vitals within defined limits. 0/10 pain, no skin abrasions, left arm and left ankle swollen. Left grip weaker compared to right grip, lung sounds heard bilaterally, diminished sounds in all lobes. LVAD pump humming.

## 2022-10-08 NOTE — Progress Notes (Addendum)
Assessment done at 1330. Patient vitals within defined limits. 0/10 pain, no skin abrasions, bruising on left upper extremity;  left upper extremity and left ankle swollen. Left grip weaker compared to right grip, lung sounds heard bilaterally, diminished sounds in all lobes. LVAD pump humming.  PICC line in right upper extremity, dressing is clean, dry, and intact.    I agree with this student's documentation.

## 2022-10-08 NOTE — Progress Notes (Signed)
PROGRESS NOTE   Subjective/Complaints:  Pt doing well again today, slept better last night!  Denies pain right now, pain overall controlled.  LBM yesterday but also trying to have one this morning.  Urinating fine.  Denies any other complaints or concerns.   ROS:   Pt denies SOB, abd pain, CP, N/V/C/D, and vision changes  Objective:   VAS [place] UPPER EXTREMITY VENOUS DUPLEX  Result Date: 10/07/2022 UPPER VENOUS STUDY  Patient Name:  Charles Holmes  Date of Exam:   10/06/2022 Medical Rec #: [id number]        Accession #:    [id number] Date of Birth: Jan 31, 1965        Patient Gender: M Patient Age:   57 years Exam Location:  [facility] Procedure:      VAS [place] UPPER EXTREMITY VENOUS DUPLEX Referring Phys: [name] VANTRIGT --------------------------------------------------------------------------------  Indications: Edema Risk Factors: Immobility. Anticoagulation: Coumadin. Comparison Study: No prior study Performing Technologist: Shona Simpson  Examination Guidelines: A complete evaluation includes B-mode imaging, spectral Doppler, color Doppler, and power Doppler as needed of all accessible portions of each vessel. Bilateral testing is considered an integral part of a complete examination. Limited examinations for reoccurring indications may be performed as noted.  Right Findings: +----------+------------+---------+-----------+----------+-------+ RIGHT     CompressiblePhasicitySpontaneousPropertiesSummary +----------+------------+---------+-----------+----------+-------+ Subclavian    Full       Yes       Yes                      +----------+------------+---------+-----------+----------+-------+  Left Findings: +----------+------------+---------+-----------+--------------------+-------+ LEFT      CompressiblePhasicitySpontaneous     Properties     Summary  +----------+------------+---------+-----------+--------------------+-------+ IJV           Full       Yes       No                                 +----------+------------+---------+-----------+--------------------+-------+ Subclavian    Full       Yes       Yes                                +----------+------------+---------+-----------+--------------------+-------+ Axillary      Full       Yes       Yes                                +----------+------------+---------+-----------+--------------------+-------+ Brachial      Full       Yes       Yes                                +----------+------------+---------+-----------+--------------------+-------+ Radial        Full       Yes       Yes                                +----------+------------+---------+-----------+--------------------+-------+  Ulnar         Full       Yes       Yes                                +----------+------------+---------+-----------+--------------------+-------+ Cephalic      None       No        No     spongy w/compressionChronic +----------+------------+---------+-----------+--------------------+-------+ Basilic       Full       Yes       Yes                                +----------+------------+---------+-----------+--------------------+-------+ 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  Left: No evidence of deep vein thrombosis in the upper extremity. 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  *See table(s) above for measurements and observations.  Diagnosing physician: Coral Else MD Electronically signed by Coral Else MD on 10/07/2022 at 10:07:16 AM.    Final    Recent Labs    10/07/22 0339 10/08/22 0435  WBC 7.1 6.7  HGB 9.2* 9.3*  HCT 30.9* 32.2*  PLT 243 263     Recent Labs    10/07/22 0339  10/08/22 0435  NA 137 141  K 3.8 4.0  CL 103 102  CO2 28 29  GLUCOSE 78 83  BUN 7 9  CREATININE 0.66 0.70  CALCIUM 8.3* 8.8*    Intake/Output Summary (Last 24 hours) at 10/08/2022 0852 Last data filed at 10/08/2022 0715 Gross per 24 hour  Intake 595 ml  Output 1425 ml  Net -830 ml        Physical Exam: Vital Signs Blood pressure (!) 111/98, pulse 76, temperature 98.4 F (36.9 C), temperature source Oral, resp. rate 18, height 5\' 7"  (1.702 m), weight 76.1 kg, SpO2 98%.   General: awake, alert, appropriate,  laying in bed; NAD HENT: conjugate gaze; oropharynx moist CV: LVAD hum-strong, very trace BLE edema but better than before Pulmonary: CTA B/L; no W/R/R, no increased WOB GI: soft, NT, ND, (+)BS- normoactive Psychiatric: appropriate- a little depressed, flat but happier this morning Neurological: Ox3 Extremities; LUE somewhat swollen- likely from stroke- elevated on pillow Skin: chest incisions clean healing well , LVAD drive site clean/dressed  PRIOR EXAMS: Neuro:  Alert and oriented x 3. Fair insight and awareness. Intact Memory. Normal language and speech. Cranial nerve exam unremarkable except for mild left central 7. MMT: LUE delt, bi, tri, grip   3/5 prox to distal. LLE 4-/5 throughout. Pt with intact  LT sense on left but feels a little different compared to R side . No abnl resting tone. DTR's tr to1+.   Musculoskeletal: Full ROM, No pain with AROM or PROM in the neck, trunk, or extremities. Posture appropriate     Assessment/Plan: 1. Functional deficits which require 3+ hours per day of interdisciplinary therapy in a comprehensive inpatient rehab setting. Physiatrist is providing close team supervision and 24 hour management of active medical problems listed below. Physiatrist and rehab team continue to assess barriers to discharge/monitor patient progress toward functional and medical goals  Care Tool:  Bathing    Body parts bathed by patient: Chest, Left  arm, Abdomen, Front perineal area,  Face   Body parts bathed by helper: Buttocks, Right upper leg, Left upper leg, Right lower leg, Left lower leg, Right arm     Bathing assist Assist Level: Maximal Assistance - Patient 24 - 49%     Upper Body Dressing/Undressing Upper body dressing   What is the patient wearing?: Pull over shirt    Upper body assist Assist Level: Maximal Assistance - Patient 25 - 49%    Lower Body Dressing/Undressing Lower body dressing      What is the patient wearing?: Pants     Lower body assist Assist for lower body dressing: Minimal Assistance - Patient > 75%     Toileting Toileting    Toileting assist Assist for toileting: Maximal Assistance - Patient 25 - 49%     Transfers Chair/bed transfer  Transfers assist  Chair/bed transfer activity did not occur: Safety/medical concerns (fatigue)  Chair/bed transfer assist level: Contact Guard/Touching assist     Locomotion Ambulation   Ambulation assist   Ambulation activity did not occur: Safety/medical concerns (fatigue)  Assist level: Contact Guard/Touching assist Assistive device: Walker-rolling Max distance: 150'   Walk 10 feet activity   Assist  Walk 10 feet activity did not occur: Safety/medical concerns (fatigue)  Assist level: Contact Guard/Touching assist Assistive device: Walker-rolling   Walk 50 feet activity   Assist Walk 50 feet with 2 turns activity did not occur: Safety/medical concerns  Assist level: Contact Guard/Touching assist Assistive device: Walker-rolling    Walk 150 feet activity   Assist Walk 150 feet activity did not occur: Safety/medical concerns  Assist level: Contact Guard/Touching assist Assistive device: Walker-rolling    Walk 10 feet on uneven surface  activity   Assist Walk 10 feet on uneven surfaces activity did not occur: Safety/medical concerns (fatigue)         Wheelchair     Assist Is the patient using a wheelchair?:  Yes Type of Wheelchair: Manual Wheelchair activity did not occur: Safety/medical concerns (fatigue)         Wheelchair 50 feet with 2 turns activity    Assist    Wheelchair 50 feet with 2 turns activity did not occur: Safety/medical concerns       Wheelchair 150 feet activity     Assist  Wheelchair 150 feet activity did not occur: Safety/medical concerns       Blood pressure (!) 111/98, pulse 76, temperature 98.4 F (36.9 C), temperature source Oral, resp. rate 18, height 5\' 7"  (1.702 m), weight 76.1 kg, SpO2 98%.  Medical Problem List and Plan: 1. Functional deficits secondary to acute right MCA infarct/ LVAD pt             -patient may shower             -ELOS/Goals: modI/S 10/17/22   -Con't CIR PT, OT and SLP   2.  Antithrombotics: -DVT/anticoagulation:  Pharmaceutical: Coumadin per pharmacy PT therapeutic 9/20- 2.1 Has been subtherapeutic for the last 3 d in 1.7-1.8 range  -10/08/22 INR 1.9 today; appreciate pharmacy support              -antiplatelet therapy: Aspirin 81 mg daily   3. Pain Management: Tylenol and Tramadol as needed   4. Mood/Behavior/Sleep: delirium/insomnia - team to provide ongoing ego support             -continue mirtazapine 7.5 mg q HS             -continue sertraline 50 mg daily             -  continue melatonin 3 mg q HS -Started Trazodone 50mg  at bedtime on 09/29/22             -antipsychotic agents: n/a -9/13- mood somewhat better than when saw pt Tuesday- con't regimen and monitor -09/30/22 not sleeping well, increased Melatonin to 5mg  QHS; if not helping, may consider restarting mirtazapine as above 9/18 poor sleep related to freq urination related to lasix , intake of fluids is minimal may need to use comp hose to prvent accumulation during the day and prevent nocturnal diuresis  -10/07/22 mirtazepine restarted 7.5mg  QHS -- IMPROVED 9/22! 5. Neuropsych/cognition: This patient is capable of making decisions on his own behalf.    6. Skin/Wound Care: Routine skin care checks   7. Fluids/Electrolytes/Nutrition: strict Is and Os and follow-up chemistries             -poor PO intake on Marinol             -continue supplements   -9/12 labs reviewed. low albumin still continue interventions above -10/08/22 BMP today much better overall, labs stable over last couple days  8: Hypotension but difficult to assess due to LVAD    9/13- BP soft, but denies orthostatic hypotension -9/14-15/24 BPs generally stable/improving; was previously on midodrine, but per HF team pt was stable off it;  Difficult to measure BP due to LVAD as well as LUE edema post CVA -9/21-22/24 BPs good, cont monitoring Vitals:   10/06/22 1039 10/06/22 1244 10/06/22 2013 10/07/22 0021  BP: (!) 87/76 93/81 112/82 93/80   10/07/22 0458 10/07/22 0800 10/07/22 1200 10/07/22 1351  BP: (!) 109/97 102/86 108/88 105/78   10/07/22 1600 10/07/22 1936 10/07/22 2359 10/08/22 0419  BP: 100/84 104/69 92/67 (!) 111/98     9: Hyperlipidemia: continue atorvastatin 80mg  daily   10: Enterococcus bacteremia: 6 weeks IV antibiotic, ceftriaxone and ampicillin last dose 10/19 will go home with PICC and HH             -follow-up with ID  -09/30/22 PICC placed this AM  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: Home meds: Coreg 3.125, Lasix 40mg , Lostartan 25mg , Aldactone 12.5, Farxiga 10mg , mexilitene --holding all currently   12: Acute on chronic systolic heart failure with cardiogenic shock s/p LVAD             -daily weights balanced so far             -continue digoxin 0.125 mg daily             -continue mexiletine 250 mg BID -continue midodrine 5 mg q 8 hours--not ordered, per HF team on 9/13 pt was stable off Midodrine; hold for now             -continue sildenafil 20 mg q 8 hours             -continue warfarin per pharmacy Managed by Heart Failure team - coordinating with primary service for activity tolerance monitoring  -10/08/22 wt stable Filed Weights   10/06/22 0544  10/07/22 0500 10/08/22 0419  Weight: 77.8 kg 76.4 kg 76.1 kg    13: Hx of VT: continue mexiletine, digoxin; has ICD   14: DM-2: A1c = 5.5%; monitor PO intake; DC SSI (Farxiga at home)  15: OSA: needs outpatient sleep study   16: Anemia, iron deficiency: follow-up CBC--most recent hgb 9.5--lab pending still today?  -10/08/22 Hgb stable 9.3, monitor    Latest Ref Rng & Units 10/08/2022    4:35 AM 10/07/2022  3:39 AM 10/06/2022   10:05 AM  CBC  WBC 4.0 - 10.5 K/uL 6.7  7.1  8.2   Hemoglobin 13.0 - 17.0 g/dL 9.3  9.2  9.8   Hematocrit 39.0 - 52.0 % 32.2  30.9  34.2   Platelets 150 - 400 K/uL 263  243  296       17: Severe COPD:              -continue Ellipta one puff daily   18: S/p tooth extractions this admission: monitor PO tolerance   19: Glaucoma: continue Cosopt and Xalatan   20.  Candidiasis in groin start nystatin powder   21.  Dysuria- UA neg , on broad spectrum IV abx for bacteremia-- resolved 10/07/22  22.  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 Shouldn't need any adjustments. Awaiting cards note; appreciate assistance.     LOS: 11 days A FACE TO FACE EVALUATION WAS PERFORMED  [address] 10/08/2022, 8:52 AM

## 2022-10-09 LAB — CBC WITH DIFFERENTIAL/PLATELET
Abs Immature Granulocytes: 0.02 10*3/uL (ref 0.00–0.07)
Basophils Absolute: 0.1 10*3/uL (ref 0.0–0.1)
Basophils Relative: 1 %
Eosinophils Absolute: 0.2 10*3/uL (ref 0.0–0.5)
Eosinophils Relative: 3 %
HCT: 31.6 % — ABNORMAL LOW (ref 39.0–52.0)
Hemoglobin: 9.2 g/dL — ABNORMAL LOW (ref 13.0–17.0)
Immature Granulocytes: 0 %
Lymphocytes Relative: 18 %
Lymphs Abs: 1.2 10*3/uL (ref 0.7–4.0)
MCH: 27.9 pg (ref 26.0–34.0)
MCHC: 29.1 g/dL — ABNORMAL LOW (ref 30.0–36.0)
MCV: 95.8 fL (ref 80.0–100.0)
Monocytes Absolute: 0.8 10*3/uL (ref 0.1–1.0)
Monocytes Relative: 12 %
Neutro Abs: 4.4 10*3/uL (ref 1.7–7.7)
Neutrophils Relative %: 66 %
Platelets: 253 10*3/uL (ref 150–400)
RBC: 3.3 MIL/uL — ABNORMAL LOW (ref 4.22–5.81)
RDW: 18 % — ABNORMAL HIGH (ref 11.5–15.5)
WBC: 6.7 10*3/uL (ref 4.0–10.5)
nRBC: 0 % (ref 0.0–0.2)

## 2022-10-09 LAB — BASIC METABOLIC PANEL
Anion gap: 5 (ref 5–15)
BUN: 7 mg/dL (ref 6–20)
CO2: 29 mmol/L (ref 22–32)
Calcium: 8.4 mg/dL — ABNORMAL LOW (ref 8.9–10.3)
Chloride: 105 mmol/L (ref 98–111)
Creatinine, Ser: 0.71 mg/dL (ref 0.61–1.24)
GFR, Estimated: >60 mL/min (ref >60)
Glucose, Bld: 75 mg/dL (ref 70–99)
Potassium: 4 mmol/L (ref 3.5–5.1)
Sodium: 139 mmol/L (ref 135–145)

## 2022-10-09 LAB — PROTIME-INR
INR: 1.8 — ABNORMAL HIGH (ref 0.8–1.2)
Prothrombin Time: 21.4 seconds — ABNORMAL HIGH (ref 11.4–15.2)

## 2022-10-09 LAB — MAGNESIUM: Magnesium: 1.8 mg/dL (ref 1.7–2.4)

## 2022-10-09 MED ORDER — WARFARIN SODIUM 4 MG PO TABS
4.0000 mg | ORAL_TABLET | Freq: Every day | ORAL | Status: DC
  Administered 2022-10-09: 4 mg via ORAL
  Filled 2022-10-09: qty 1

## 2022-10-09 MED ORDER — MAGNESIUM SULFATE 2 GM/50ML IV SOLN
2.0000 g | Freq: Once | INTRAVENOUS | Status: AC
  Administered 2022-10-09: 2 g via INTRAVENOUS
  Filled 2022-10-09: qty 50

## 2022-10-09 NOTE — Progress Notes (Signed)
Occupational Therapy Session Note  Patient Details  Name: Charles Holmes MRN: [id number] Date of Birth: 1965/01/31  Today's Date: 10/09/2022 OT Individual Time: 0950-1100 and 1315-1415 (pt missed 15 min for request to finish lunch) OT Individual Time Calculation (min): 70 min and 60 min   Short Term Goals: Week 1:  OT Short Term Goal 1 (Week 1): Pt will complete toilet transfers min A using LRAD OT Short Term Goal 1 - Progress (Week 1): Met OT Short Term Goal 2 (Week 1): Pt will recall hemi-dressing techniques with min verbal cues OT Short Term Goal 2 - Progress (Week 1): Met OT Short Term Goal 3 (Week 1): Pt will maintain sternal precautions during BADLs and functional transfers at 95% safe with min verbal cues OT Short Term Goal 3 - Progress (Week 1): Met OT Short Term Goal 4 (Week 1): Pt will maintain dynamic standing balance using LRAD during functional task >1 minute to demonstrate increased activity tolerance OT Short Term Goal 4 - Progress (Week 1): Met Week 2:  OT Short Term Goal 1 (Week 2): Pt will demonstrate improved L FMC to fasten and unfasten LVAD connectors in less than 1 minute. OT Short Term Goal 2 (Week 2): Pt will demonstrate improved L hand function to grasp pants to pull over L side of hips with min A. OT Short Term Goal 3 (Week 2): Pt will demonstrate improved memory to recall steps of LVAD battery change with min cues. OT Short Term Goal 4 (Week 2): Pt will complete BSC or toilet transfers with min A.  Skilled Therapeutic Interventions/Progress Updates:    Visit 1: no c/o pain  Pt received in bed and agreeable to washing up at sink. He initially declining any pants but suggested he have them on for his PT session.  I would help him remove them after lunch.  Pt had just used urinal with nursing.   With [facility] elevated pt able to sit to EOB with min A.  Discussed with pt the recommendation for a hospital bed until he is no longer on sternal precautions.  Pt continues to  need cues to not push up with his R arm with sit to stands.  Pt stood and ambulated to w/c at sink with CGA.  Pt engaged in sponge bathe with min A overall for R arm and lower legs and L hip.  Pt has improved tolerance with standing of 2-3 minutes at a time.  Pt stated he might need to toilet but not until later.  Throughout session asked him a few times if he needs to toilet, but pt declined.   After dressing with gown and pants, session time over. Pt set up in w/c and alarm on. Just as I was leaving room, pt requested to toilet. Notified nursing staff.    Visit 2:   No c/o pain At start of session, pt in bed finishing lunch. Nursing in room talking with pt. Once pt finished, pt requested to toilet.  His Picc line tangled in LVAD cords so had to switch cords so heavy cords not laying on top of PICC line cord.  Pt sat to bed with HOB elevated with CGA.  Stood with CGA and helped to push IV pole with R hand while I helped to hold LVAD cords so pt would not trip on them. Pt able to toilet and self cleanse following sternal precautions. He does continue to try to use R hand to push up or pull on bars.  Pt ambulated to chair in room.  From chair he worked on various LUE NMR exercises using UE ranger,  bimanual hands on bar, cone stacking and towel scrunches with a/arom. Pt is improving with AROM daily and can use L hand as a gross assist.  Pt requested to get back to bed.  Got into bed with supervision. Set up with all needs met. Bed alarm set.   LVAD nurse arrived at end of session.     Therapy Documentation Precautions:  Precautions Precautions: Sternal, Fall, Other (comment) Precaution Comments: LVAD Restrictions Weight Bearing Restrictions: No RUE Weight Bearing:  (sternal prec) LUE Weight Bearing:  (sternal prec) RLE Weight Bearing: Weight bearing as tolerated Other Position/Activity Restrictions: Sternal Precautions    Vital Signs: Therapy Vitals Temp: 97.8 F (36.6 C) Pulse Rate:  69 Resp: 18 BP: 103/79 Patient Position (if appropriate): Lying Oxygen Therapy SpO2: 98 % O2 Device: Room Air Pain:   ADL: ADL Eating: Minimal assistance Where Assessed-Eating: Bed level Grooming: Minimal assistance Where Assessed-Grooming: Edge of bed Upper Body Bathing: Moderate assistance Where Assessed-Upper Body Bathing: Edge of bed Lower Body Bathing: Maximal assistance Where Assessed-Lower Body Bathing: Edge of bed Upper Body Dressing: Moderate assistance Where Assessed-Upper Body Dressing: Edge of bed Lower Body Dressing: Moderate assistance Where Assessed-Lower Body Dressing: Edge of bed Toileting: Maximal assistance Where Assessed-Toileting: Bed level Toilet Transfer: Minimal assistance Toilet Transfer Method: Ambulating, Stand pivot Toilet Transfer Equipment: Grab bars, Bedside commode Tub/Shower Transfer: Not assessed Film/video editor: Not assessed  Therapy/Group: Individual Therapy  Charles Holmes 10/09/2022, 8:58 AM

## 2022-10-09 NOTE — Progress Notes (Signed)
Physical Therapy Session Note  Patient Details  Name: Charles Holmes MRN: [id number] Date of Birth: 09-21-1965  Today's Date: 10/09/2022 PT Individual Time: 1124-1212 PT Individual Time Calculation (min): 48 min   Short Term Goals: Week 1:  PT Short Term Goal 1 (Week 1): Pt will require min A consisntely for bed mobility PT Short Term Goal 1 - Progress (Week 1): Met PT Short Term Goal 2 (Week 1): Pt will require mod A with sit to stand with LRAD PT Short Term Goal 2 - Progress (Week 1): Met PT Short Term Goal 3 (Week 1): Pt will require max A x 1 for gait x 10 ft with LRAD PT Short Term Goal 3 - Progress (Week 1): Met Week 2:  PT Short Term Goal 1 (Week 2): Pt will require consistent supervision for bed mobility. PT Short Term Goal 2 (Week 2): Pt will require consistent CGA for sit to stand with LRAD. PT Short Term Goal 3 (Week 2): Pt will require supervision for ambulation >100 ft with LRAD and no rest breaks.  Skilled Therapeutic Interventions/Progress Updates:  Patient seated upright in w/c on entrance to room. Has uncle present who leaves to visit another family member in hospital. Patient alert and agreeable to PT session.   Patient with no pain complaint at start of session.  Therapeutic Activity: Pt provided with practice cord attachments to LVAD base unit. Pt relates desire to walk and so wants to use actual cords in order to switch to battery packs.Requested for patient to guide therapist in all steps of changing to battery power with ModA and extra time provided to physically change out cords.   Transfers: Pt performed sit<>stand and stand pivot transfers throughout session with CGA initially and improving to overall supervision. Provided verbal cues for gentle push from RUE armrest and forward hip hinge for weight shift.  Gait Training:  Pt ambulated 135' x2 using no AD for first 61' then IV pole for steadying with all remaining distances. Demonstrated L path deviation  without AD that improves with vc for maintaining straight path. Extensive seated rest break required d/t fatigue. Educated on energy conservation and slowing gait speed.   Battery packs doffed and pt placed back on base unit with TotA for time.   Patient supine in bed at end of session with brakes locked, bed alarm set, and all needs within reach.   Therapy Documentation Precautions:  Precautions Precautions: Sternal, Fall, Other (comment) Precaution Comments: LVAD Restrictions Weight Bearing Restrictions: No RUE Weight Bearing:  (sternal prec) LUE Weight Bearing:  (sternal prec) RLE Weight Bearing: Weight bearing as tolerated Other Position/Activity Restrictions: Sternal Precautions  Pain:  No pain indicated throughout session.   Therapy/Group: Individual Therapy   Loel Dubonnet PT, DPT, CSRS 10/09/2022, 5:56 PM

## 2022-10-09 NOTE — Progress Notes (Signed)
LVAD Coordinator Rounding Note:  Admitted 08/10/22 due to acute on chronic CHF with cardiogenic shock. Milrinone dependent. Advance therapy workup completed, and pt deemed acceptable VAD candidate. Dental extractions completed 8/6. IABP placed 08/25/22.  HM 3 LVAD implanted on 08/29/22 by Dr Donata Clay under destination therapy criteria. 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 Result negative.  7/25 Admit with cardiogenic shock. Started milrinone and NE. 8/6 S/P 13 teeth extractions  8/9 IABP placed 8/13 S/p HM III LVAD implant + clipping left atrial appendage d/t severe thickening and invagination of mitral valve annulus impeding flows  CT Head 8/14 (initial) 1. 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 ASPECTS is 7. 2. No acute hemorrhage.  CT Angio Head/Neck 1. 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 2. Core infarct of 12 cc (somewhat underestimated compared to aspects) with 90 cc of penumbra. 3. Mild atherosclerosis.  Pt taken emergently to IR for percutaneous right common carotid arteriogram with thrombectomy. Revascularization achieved. Angio-seal closure device applied to left groin- clean, dry, and intact.   CT Head 8/15 @ 0450 Unchanged extent of infarct and hemorrhage in the right MCA distribution including small volume intraventricular clot. No hydrocephalus.  CT Head 8/17 @ 0701 Interval evolution of the right MCA territory infarct with decreased intraparenchymal and intraventricular hemorrhage. No hydrocephalus or midline shift.   Pt lying in bed this morning in good spirits. He is making progress. OT in the room. Pt tentative d/c date is October 1.   OT requesting PICC be saline locked for more optimal therapy sessions. Will make bedside RN aware moving forward.  Blood cultures from 9/6 positive for Enterococcus Faecalis. Subsequent blood cultures NGTD. ID following recommending 6 weeks of IV Ampicillin  and Ceftriaxone followed by chronic suppression with Amoxicillin.   VAD coordinator/nurses working with pt on changing power sources. He is unable to do this due to his left sided weakness. Modification of his power cords was made in hopes he can grip the cords better. Has practice power cords for use by pt. See below for education documentation. He is making progress with therapy.  Pt may not shower at this time.   Vital signs: Temp: 98.4 HR: 86 Doppler Pressure: 84 Automatic BP: 92/75 (83) O2 Sat: 94% on RA Wt: 183.6>191.1>190.9>190.2>184.8>...>163.1>162.5>171.3>172.4>176.6> 180.6>175.1>174.8>176.3>175.7>177.5>173>171.5>167.3 lbs   LVAD interrogation reveals:  Speed: 5450 Flow: 4.6 Power: 4.0 w PI: 4.6  Alarms: none Events: 4 PI today  Hematocrit: 30 Fixed speed: 5400 Low speed limit: 5100  Drive Line: Existing VAD dressing removed and site care performed using sterile technique. Drive line exit site cleaned with Chlora prep applicators x 2, rinsed with saline, allowed to dry, and Vashe moistened 2x2 placed around driveline then covered with dry 4x4. Exit site healing and unincorporated, the velour is fully implanted at exit site. Scant amount of serous drainage.Slight redness, no tenderness, foul odor or rash noted. Drive line anchor re-applied. Continue twice a week dressing changes on Monday/Thursday. Next dressing change due 10/12/22 by VAD coordinator or nurse champion only or [place].      Labs:  LDH trend: 596>441>165>384>345>312>312>350>327>309>316>305>298>277>273>298>280>285>251>252>205  INR trend: 1.4>1.6>1.1>1.1>1.3>1.2>1>1.3>1.4>1.5>1.6>2.8>2.8>2.3>2.2>2.3>2.6>2.6>1.7>2>1.8>1.7>2.1>1.8  AST/ALT trend: 218/45>96/30>73/23>63/36>58/37>69/50>93/72>123/103>143/150>117/138>119/138>92/127>87/123>50/71>46/65>46/43  Total Bili trend: 4.4>4.6>5.1>1.8>1.9>1.2>1.0>1.5>1.1>0.8>0.9>1.0>0.8  WBC trend:  11.6>13.7>19.6>16.1>23>26.6>21>24.6>18.1>16.5>13.2>13.1>12.5>11.6>10.5>10.8>13.1>9.5>9.1>9.7>7.8>6.7  Anticoagulation Plan: -INR Goal: 2.0 - 2.5 -ASA Dose: 81 mg  - Coumadin dosing per pharmacy  Blood Products:  IntraOp 8/14: - 4 FFP - 2 Platelets - 2 PRBC - 1 cyro - 449 cc of cellsaver - DDAVP  20 mcg x 1   Device: Medtronic BiV -Therapies: ON VF ON >207 BPM FVT ON 176-207 BPM VT ON 171-207 BPM  Arrythmias:   Respiratory:   Infection:  09/01/22>> sputum cx>> NGTD>>final 09/01/22>> blood cx>> NGTD>>final 09/01/22>> urine cx>> NGTD>> final 09/13/22>>blood cxs>>no growth 5 days; final 09/22/22>>blood cxs>> Enterococcus faecalis  09/23/22>> blood cxs>> neg final 09/24/22>> blood cxs>> neg final  Renal:  9/23:7/0.71  Adverse Events on VAD: - 08/30/22: - Developed left sided weakness this am. CTA with R MCA infarct. Taken to IR for thrombectomy   Patient Education: Discharge teaching completed with pt's [name] on 9/5. See separate note for documentation.   Plan/Recommendations:  Please page VAD coordinator for any alarms or VAD equipment issues. Continue twice a week dressing changes on Monday/Thursday by VAD coordinator, Nurse Alla Feeling or [place].  Simmie Davies RN,BSN VAD Coordinator  Office: [phone]  24/7 Pager: [phone]

## 2022-10-09 NOTE — Progress Notes (Addendum)
PROGRESS NOTE   Subjective/Complaints:  Pt overall without significant complaints. Having pain in LLQ below drive for LVAD  ROS: Patient denies fever, rash, sore throat, blurred vision, dizziness, nausea, vomiting, diarrhea, cough,  chest pain, joint or back/neck pain, headache, or mood change.   Objective:   No results found. Recent Labs    10/08/22 0435 10/09/22 0336  WBC 6.7 6.7  HGB 9.3* 9.2*  HCT 32.2* 31.6*  PLT 263 253     Recent Labs    10/08/22 0435 10/09/22 0336  NA 141 139  K 4.0 4.0  CL 102 105  CO2 29 29  GLUCOSE 83 75  BUN 9 7  CREATININE 0.70 0.71  CALCIUM 8.8* 8.4*    Intake/Output Summary (Last 24 hours) at 10/09/2022 1118 Last data filed at 10/09/2022 0830 Gross per 24 hour  Intake 182 ml  Output 2075 ml  Net -1893 ml        Physical Exam: Vital Signs Blood pressure 103/79, pulse 69, temperature 97.8 F (36.6 C), resp. rate 18, height 5\' 7"  (1.702 m), weight 75.9 kg, SpO2 98%.   Constitutional: No distress . Vital signs reviewed. HEENT: NCAT, EOMI, oral membranes moist Neck: supple Cardiovascular: LVAD hum. No JVD    Respiratory/Chest: CTA Bilaterally without wheezes or rales. Normal effort    GI/Abdomen: BS +, tenderness in LLQ below LVAD drive Ext: no clubbing, cyanosis, or edema Psych: pleasant and cooperative  Skin: chest incisions clean healing well , LVAD drive site clean/dressed Neuro:  Alert and oriented x 3. Fair insight and awareness. Intact Memory. Normal language and speech. Cranial nerve exam unremarkable except for mild left central 7. MMT: LUE delt, bi, tri, grip   3/5 prox to distal. LLE 4-/5 throughout. Pt with intact  LT sense on left but feels a little different compared to R side . No abnl resting tone. DTR's tr to1+.   Musculoskeletal: Full ROM, No pain with AROM or PROM in the neck, trunk, or extremities. Posture appropriate     Assessment/Plan: 1.  Functional deficits which require 3+ hours per day of interdisciplinary therapy in a comprehensive inpatient rehab setting. Physiatrist is providing close team supervision and 24 hour management of active medical problems listed below. Physiatrist and rehab team continue to assess barriers to discharge/monitor patient progress toward functional and medical goals  Care Tool:  Bathing    Body parts bathed by patient: Chest, Left arm, Abdomen, Front perineal area, Face   Body parts bathed by helper: Buttocks, Right upper leg, Left upper leg, Right lower leg, Left lower leg, Right arm     Bathing assist Assist Level: Maximal Assistance - Patient 24 - 49%     Upper Body Dressing/Undressing Upper body dressing   What is the patient wearing?: Pull over shirt    Upper body assist Assist Level: Maximal Assistance - Patient 25 - 49%    Lower Body Dressing/Undressing Lower body dressing      What is the patient wearing?: Pants     Lower body assist Assist for lower body dressing: Minimal Assistance - Patient > 75%     Toileting Toileting    Toileting assist Assist for  toileting: Maximal Assistance - Patient 25 - 49%     Transfers Chair/bed transfer  Transfers assist  Chair/bed transfer activity did not occur: Safety/medical concerns (fatigue)  Chair/bed transfer assist level: Contact Guard/Touching assist     Locomotion Ambulation   Ambulation assist   Ambulation activity did not occur: Safety/medical concerns (fatigue)  Assist level: Contact Guard/Touching assist Assistive device: Walker-rolling Max distance: 150'   Walk 10 feet activity   Assist  Walk 10 feet activity did not occur: Safety/medical concerns (fatigue)  Assist level: Contact Guard/Touching assist Assistive device: Walker-rolling   Walk 50 feet activity   Assist Walk 50 feet with 2 turns activity did not occur: Safety/medical concerns  Assist level: Contact Guard/Touching assist Assistive  device: Walker-rolling    Walk 150 feet activity   Assist Walk 150 feet activity did not occur: Safety/medical concerns  Assist level: Contact Guard/Touching assist Assistive device: Walker-rolling    Walk 10 feet on uneven surface  activity   Assist Walk 10 feet on uneven surfaces activity did not occur: Safety/medical concerns (fatigue)         Wheelchair     Assist Is the patient using a wheelchair?: Yes Type of Wheelchair: Manual Wheelchair activity did not occur: Safety/medical concerns (fatigue)         Wheelchair 50 feet with 2 turns activity    Assist    Wheelchair 50 feet with 2 turns activity did not occur: Safety/medical concerns       Wheelchair 150 feet activity     Assist  Wheelchair 150 feet activity did not occur: Safety/medical concerns       Blood pressure 103/79, pulse 69, temperature 97.8 F (36.6 C), resp. rate 18, height 5\' 7"  (1.702 m), weight 75.9 kg, SpO2 98%.  Medical Problem List and Plan: 1. Functional deficits secondary to acute right MCA infarct/ LVAD pt             -patient may shower             -ELOS/Goals: modI/S 10/17/22   -Continue CIR therapies including PT, OT, and SLP    2.  Antithrombotics: -DVT/anticoagulation:  Pharmaceutical: Coumadin per pharmacy PT therapeutic 9/20- 2.1 Has been subtherapeutic for the last 3 d in 1.7-1.8 range  -9/23 INR still only 1.8               -antiplatelet therapy: Aspirin 81 mg daily   3. Pain Management: Tylenol and Tramadol as needed   -LUQ Is due to LVAD driveline--reposition self as possible when needed. 4. Mood/Behavior/Sleep: delirium/insomnia - team to provide ongoing ego support             -continue mirtazapine 7.5 mg q HS             -continue sertraline 50 mg daily             -continue melatonin 3 mg q HS -Started Trazodone 50mg  at bedtime on 09/29/22             -antipsychotic agents: n/a -9/13- mood somewhat better than when saw pt Tuesday- con't  regimen and monitor -09/30/22 not sleeping well, increased Melatonin to 5mg  QHS; if not helping, may consider restarting mirtazapine as above 9/18 poor sleep related to freq urination related to lasix , intake of fluids is minimal may need to use comp hose to prvent accumulation during the day and prevent nocturnal diuresis  -10/07/22 mirtazepine restarted 7.5mg  QHS -- IMPROVED   5. Neuropsych/cognition: This patient  is capable of making decisions on his own behalf.   6. Skin/Wound Care: Routine skin care checks   7. Fluids/Electrolytes/Nutrition: strict Is and Os and follow-up chemistries             -poor PO intake on Marinol             -continue supplements   -9/12 labs reviewed. low albumin still continue interventions above -10/08/22 BMP today much better overall, labs stable over last couple days  8: Hypotension but difficult to assess due to LVAD    9/13- BP soft, but denies orthostatic hypotension -9/14-15/24 BPs generally stable/improving; was previously on midodrine, but per HF team pt was stable off it;  Difficult to measure BP due to LVAD as well as LUE edema post CVA -09/19/22 BPs under control except for one recording Vitals:   10/07/22 1600 10/07/22 1936 10/07/22 2359 10/08/22 0419  BP: 100/84 104/69 92/67 (!) 111/98   10/08/22 0800 10/08/22 1200 10/08/22 1300 10/08/22 1600  BP: 98/68 104/64 (!) 111/49 100/62   10/08/22 1945 10/09/22 0005 10/09/22 0452 10/09/22 0742  BP: 106/75 104/86 103/78 103/79     9: Hyperlipidemia: continue atorvastatin 80mg  daily   10: Enterococcus bacteremia: 6 weeks IV antibiotic, ceftriaxone and ampicillin last dose 10/19 will go home with PICC and HH             -follow-up with ID  -09/30/22 PICC placed this AM  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: Home meds: Coreg 3.125, Lasix 40mg , Lostartan 25mg , Aldactone 12.5, Farxiga 10mg , mexilitene --holding all currently   12: Acute on chronic systolic heart failure with cardiogenic shock s/p LVAD              -daily weights balanced so far             -continue digoxin 0.125 mg daily             -continue mexiletine 250 mg BID -continue midodrine 5 mg q 8 hours--not ordered, per HF team on 9/13 pt was stable off Midodrine; hold for now             -continue sildenafil 20 mg q 8 hours             -continue warfarin per pharmacy Managed by Heart Failure team - coordinating with primary service for activity tolerance monitoring  -10/09/22 wt stable Filed Weights   10/07/22 0500 10/08/22 0419 10/09/22 0452  Weight: 76.4 kg 76.1 kg 75.9 kg    13: Hx of VT: continue mexiletine, digoxin; has ICD   14: DM-2: A1c = 5.5%; monitor PO intake; DC SSI (Farxiga at home)  15: OSA: needs outpatient sleep study   16: Anemia, iron deficiency: follow-up CBC--most recent hgb 9.5--lab pending still today?  -10/09/22 Hgb stable 9.2, monitor    Latest Ref Rng & Units 10/09/2022    3:36 AM 10/08/2022    4:35 AM 10/07/2022    3:39 AM  CBC  WBC 4.0 - 10.5 K/uL 6.7  6.7  7.1   Hemoglobin 13.0 - 17.0 g/dL 9.2  9.3  9.2   Hematocrit 39.0 - 52.0 % 31.6  32.2  30.9   Platelets 150 - 400 K/uL 253  263  243       17: Severe COPD:              -continue Ellipta one puff daily   18: S/p tooth extractions this admission: monitor PO tolerance   19: Glaucoma: continue Cosopt and Xalatan  20.  Candidiasis in groin start nystatin powder   21.  Dysuria- UA neg , on broad spectrum IV abx for bacteremia-- resolved 10/07/22  22.  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   - no change in plan.   LOS: 12 days A FACE TO FACE EVALUATION WAS PERFORMED  Ranelle Oyster 10/09/2022, 11:18 AM

## 2022-10-09 NOTE — Progress Notes (Signed)
Patient ID: Charles Holmes, male   DOB: 1965/03/21, 57 y.o.   MRN: [id number]     Advanced Heart Failure Rounding Note  PCP-Cardiologist: Charles Herrlich, MD  [facility]: Charles Holmes   Subjective:    Admitted to CIR on 09/11  INR 1.8  Ate all of breakfast this morning. Some left lower quadrant abdominal pain, but while lying on his driveline.   LVAD Interrogation HM III: Speed: 5400  Flow: 4.5  PI: 3.4 Power: 4  VAD interrogated personally. Parameters stable.  Objective:   Weight Range: 75.9 kg Body mass index is 26.21 kg/m.   Vital Signs:   Temp:  [97.8 F (36.6 C)-98.4 F (36.9 C)] 97.8 F (36.6 C) (09/23 0742) Pulse Rate:  [60-88] 69 (09/23 0802) Resp:  [16-19] 18 (09/23 0802) BP: (100-111)/(49-86) 103/79 (09/23 0742) SpO2:  [96 %-98 %] 98 % (09/23 0802) Weight:  [75.9 kg] 75.9 kg (09/23 0452) Last BM Date : 10/08/22  Weight change: Filed Weights   10/07/22 0500 10/08/22 0419 10/09/22 0452  Weight: 76.4 kg 76.1 kg 75.9 kg   Intake/Output:   Intake/Output Summary (Last 24 hours) at 10/09/2022 1047 Last data filed at 10/09/2022 0830 Gross per 24 hour  Intake 182 ml  Output 2075 ml  Net -1893 ml   Scheduled Meds:  aspirin EC  81 mg Oral Daily   atorvastatin  80 mg Oral Daily   Chlorhexidine Gluconate Cloth  6 each Topical BID   digoxin  0.125 mg Oral Daily   dorzolamide-timolol  1 drop Both Eyes BID   Gerhardt's butt cream   Topical TID   influenza vac split trivalent PF  0.5 mL Intramuscular Tomorrow-1000   latanoprost  1 drop Both Eyes QHS   melatonin  5 mg Oral QHS   mexiletine  250 mg Oral BID   mirtazapine  7.5 mg Oral QHS   mometasone-formoterol  2 puff Inhalation BID   multivitamin with minerals  1 tablet Oral Daily   nystatin   Topical BID   pantoprazole  40 mg Oral Daily   sertraline  50 mg Oral Daily   sildenafil  20 mg Oral TID   spironolactone  25 mg Oral Daily   traZODone  50 mg Oral QHS   umeclidinium bromide  1 puff Inhalation Daily    Warfarin - Pharmacist Dosing Inpatient   Does not apply q1600   Continuous Infusions:  sodium chloride 250 mL (10/01/22 1934)   ampicillin (OMNIPEN) IV 2 g (10/09/22 0902)   cefTRIAXone (ROCEPHIN)  IV 2 g (10/09/22 0828)   magnesium sulfate bolus IVPB 2 g (10/09/22 0950)   PRN Meds:.sodium chloride, acetaminophen, albuterol, alum & mag hydroxide-simeth, bisacodyl, guaiFENesin-dextromethorphan, ondansetron **OR** ondansetron (ZOFRAN) IV, mouth rinse, polyethylene glycol, sodium chloride, sodium chloride flush, sodium chloride flush, sodium phosphate, traMADol   Physical Exam   General: Well appearing this am. NAD.  HEENT: Normal. Neck: Supple, JVP 7-8 cm. Carotids OK.  Cardiac:  Mechanical heart sounds with LVAD hum present.  Lungs:  CTAB, normal effort.  Abdomen:  NT, ND, no HSM. No bruits or masses. +BS  LVAD exit site: Well-healed and incorporated. Dressing dry and intact. No erythema or drainage. Stabilization device present and accurately applied. Driveline dressing changed daily per sterile technique. Extremities:  Left arm swelling Neuro:  Weak on left side.    Patient Profile  Charles Holmes is a 57 y.o. 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 Admitted with cardiogenic shock, stabilized and underwent HM3 LVAD. Post implant course c/b acute CVA and enterococcus faecalis bacteremia.   Assessment/Plan  1.  Acute on chronic Systolic HF-->Cardiogenic Shock  - Diagnosed 11/2019. Presented with VT. LHC 70% LAD  - cMRI 12/21 concerning for sarcoid and EF 18%.  - PET 2/22 at [facility] EF 25% + active sarcoid - Medtronic CRT-D upgrade in 06/08/21 - Echo 07/10/22: EF <20%, RV okay, mod pericardial effusion, mod Charles/TR - Admitted 07/25 with cardiogenic shock. - RHC: Nonobstructive CAD, severely elevated filling pressures and low Fick CO/CI (2.7/1.4) - 08/13 HM III LVAD implant + clipping LAA d/t severe thickening and  invagination of mitral valve annulus impeding flows.  - Apical core sent - no mention of sarcoid - Echo 8/26 mod-sev RV dysfunction. Speed increased to 5400 8/26 - Ramp Echo 9/4, speed increased to 5400  - MAP 70s. Volume status looks good off lasix.  - On Sildenafil 20 mg tid.  - Continue spironolactone 25mg  daily - Continue digoxin 0.125 mg daily - Can transition to MWF labs  2. HM-3 LVAD - VAD interrogated personally. Parameters stable. - LDH stable - Continue ASA 81 - INR 1.8 Continue warfarin.   3.  Acute stroke - Hx CVA 06/24 -Admitted 06/24 w/ R MCA stroke. S/p TPA and mechanical clot extraction. No residual deficits. Likely cardioembolic in setting of severe LV dysfunction. - Developed left sided weakness 08/14. CTA with R MCA infarct. Taken to IR for thrombectomy - Repeat CT head with small to moderate size hemorrhagic conversion.  - repeat head CT on 8/17 w/ improved hemorrhagic CVA - Back on warfarin.  - Functional deficits improving - Continue ASA 81 mg daily - Appreciate CIR  4. Enterococcus faecalis bacteremia - Bcx 2/2 on 9/6 - ID consult 9/6 -> ampicillin and ceftriaxone - TEE recommended but doubt it will change management as VAD cannot be removed and will require IV abx followed by long-term suppression - Echo 09/10 - no obvious vegetations -  Repeat BCx 9/8 NGTD. - ID recommending 6 weeks IV amp/ceftriaxone followed by long-term suppression with amoxicillin. Stop date 11/04/22   5. Hx VT - ln setting of potential sarcoid heart disease  - Off amio due to tremor. Continue mexiletine, ICD in place   6. CAD - LHC 12/07/19 70-% LAD, no intervention - LHC 8/24 non obstructive CAD.  - Continue statin. On aspirin for VAD. No s/s angina  7. 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   8. 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 Iron deficiency anemia/ Post-op anemia - Has had IV Fe.    10. Pulmonary  - PFTs with severe obstructive defect, response to bronchodilator. FEV1 1.04L, FEV1/FVC 48% - Stable  11. Psych - Depression and delirium post VAD implant. Prolonged hospital course.  - Improving - Continue sertraline  12. Left arm swelling - 9/20 LUE dopplers with chronic superficial vein thrombosis.    [name] 10/09/2022

## 2022-10-09 NOTE — Progress Notes (Signed)
Patient complained of pain 5/10 on his LVAD site. RN noticed patient laying on system controller and holder on L side against the incision site. Patient repositioned and given pain med. Patient denies chest pain.

## 2022-10-09 NOTE — Progress Notes (Signed)
Speech Language Pathology Daily Session Note  Patient Details  Name: Charles Holmes MRN: [id number] Date of Birth: 1965-10-05  Today's Date: 10/09/2022 SLP Individual Time: 1440-1500 SLP Individual Time Calculation (min): 20 min  Short Term Goals: Week 2: SLP Short Term Goal 1 (Week 2): Patient will attend to left field of enviornment during functional tasks with Mod I SLP Short Term Goal 2 (Week 2): Patient will utilize speech intelligibility strategies at the sentence level to achieve ~80% intelligibility with Min verbal cues. SLP Short Term Goal 3 (Week 2): Patient will recall reasoning for specific LVAD alarms with Min verbal and visual cues. SLP Short Term Goal 4 (Week 2): Patient will recall sequence to switching to LVAD batteries with Min verbal and visual cues.  Skilled Therapeutic Interventions: Skilled treatment session focused on communication goals. Patient independently recalled 2/3 speech strategies and required overall Min verbal cues for use of speech intelligibility strategies during an informal conversation at the sentence level to achieve ~80% intelligibility. Patient also verbally sequenced the steps to changing his LVAD to batteries with Min verbal cues. Patient left upright in bed with alarm on and all needs within reach. Continue with current plan of care.       Pain No/Denies Pain   Therapy/Group: Individual Therapy  Diane Hanel 10/09/2022, 3:09 PM

## 2022-10-09 NOTE — Progress Notes (Signed)
ANTICOAGULATION CONSULT NOTE - Follow-up  Pharmacy Consult for warfarin Indication:  LVAD  Allergies  Allergen Reactions   Pacerone [Amiodarone] Other (See Comments)    Severe tremors   Percocet [Oxycodone-Acetaminophen] Itching    Patient Measurements: Height: 5\' 7"  (170.2 cm) Weight: 75.9 kg (167 lb 5.3 oz) IBW/kg (Calculated) : 66.1 Heparin Dosing Weight: 87kg  Vital Signs: Temp: 97.8 F (36.6 C) (09/23 0742) BP: 103/79 (09/23 0742) Pulse Rate: 69 (09/23 0802)  Labs: Recent Labs    10/07/22 0339 10/08/22 0435 10/09/22 0336  HGB 9.2* 9.3* 9.2*  HCT 30.9* 32.2* 31.6*  PLT 243 263 253  LABPROT 21.3* 22.4* 21.4*  INR 1.8* 1.9* 1.8*  CREATININE 0.66 0.70 0.71    Estimated Creatinine Clearance: 93 mL/min (by C-G formula based on SCr of 0.71 mg/dL).   Medical History: Past Medical History:  Diagnosis Date   CAD (coronary artery disease)    CHF (congestive heart failure) (HCC)    COPD (chronic obstructive pulmonary disease) (HCC)    GERD (gastroesophageal reflux disease)    Hyperlipidemia    Hypertension    LVAD (left ventricular assist device) present (HCC)    Stroke (HCC)    Systolic heart failure (HCC) 2021   LVEF 18%, RVEF 38% on cardiac MRI 12/19/2019. possible cardiac sarcoidosis.   Wide-complex tachycardia 2021   wears LifeVest     Assessment: 57 yoM on apixaban PTA for hx AF admitted for LVAD workup. Pt s/p HM3 implant on 8/13 c/b acute CVA postop, on aspirin 81 mg daily. Started low dose heparin + warfarin started 8/19. Low-dose heparin was stopped 8/31.  INR subtherapeutic at 1.8, looks like dose missed 9/19. CBC stable. 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   Goal of Therapy:  INR 2-2.5 Monitor platelets by anticoagulation protocol: Yes   Plan:  Warfarin 4mg  q1600 Monitor daily INR, CBC   Wilmer Floor, PharmD PGY2 Cardiology Pharmacy Resident Please check AMION for all [facility] Pharmacy numbers 10/09/2022

## 2022-10-10 DIAGNOSIS — D62 Acute posthemorrhagic anemia: Secondary | ICD-10-CM

## 2022-10-10 DIAGNOSIS — I952 Hypotension due to drugs: Secondary | ICD-10-CM

## 2022-10-10 LAB — BASIC METABOLIC PANEL
Anion gap: 6 (ref 5–15)
BUN: 8 mg/dL (ref 6–20)
CO2: 28 mmol/L (ref 22–32)
Calcium: 8.5 mg/dL — ABNORMAL LOW (ref 8.9–10.3)
Chloride: 104 mmol/L (ref 98–111)
Creatinine, Ser: 0.77 mg/dL (ref 0.61–1.24)
GFR, Estimated: >60 mL/min (ref >60)
Glucose, Bld: 79 mg/dL (ref 70–99)
Potassium: 4 mmol/L (ref 3.5–5.1)
Sodium: 138 mmol/L (ref 135–145)

## 2022-10-10 LAB — CBC
HCT: 32.5 % — ABNORMAL LOW (ref 39.0–52.0)
Hemoglobin: 9.3 g/dL — ABNORMAL LOW (ref 13.0–17.0)
MCH: 26.9 pg (ref 26.0–34.0)
MCHC: 28.6 g/dL — ABNORMAL LOW (ref 30.0–36.0)
MCV: 93.9 fL (ref 80.0–100.0)
Platelets: 254 10*3/uL (ref 150–400)
RBC: 3.46 MIL/uL — ABNORMAL LOW (ref 4.22–5.81)
RDW: 18.1 % — ABNORMAL HIGH (ref 11.5–15.5)
WBC: 6.7 10*3/uL (ref 4.0–10.5)
nRBC: 0 % (ref 0.0–0.2)

## 2022-10-10 LAB — PROTIME-INR
INR: 2.4 — ABNORMAL HIGH (ref 0.8–1.2)
Prothrombin Time: 26.6 seconds — ABNORMAL HIGH (ref 11.4–15.2)

## 2022-10-10 LAB — MAGNESIUM: Magnesium: 1.8 mg/dL (ref 1.7–2.4)

## 2022-10-10 MED ORDER — MAGNESIUM SULFATE 2 GM/50ML IV SOLN
2.0000 g | Freq: Once | INTRAVENOUS | Status: AC
  Administered 2022-10-10: 2 g via INTRAVENOUS
  Filled 2022-10-10: qty 50

## 2022-10-10 MED ORDER — WARFARIN SODIUM 3 MG PO TABS: 3.0000 mg | ORAL_TABLET | Freq: Every day | ORAL | Status: DC

## 2022-10-10 MED ORDER — WARFARIN SODIUM 2 MG PO TABS
2.0000 mg | ORAL_TABLET | Freq: Every day | ORAL | Status: DC
  Administered 2022-10-10: 2 mg via ORAL
  Filled 2022-10-10: qty 1

## 2022-10-10 NOTE — Progress Notes (Signed)
LVAD Coordinator Rounding Note:  Admitted 08/10/22 due to acute on chronic CHF with cardiogenic shock. Milrinone dependent. Advance therapy workup completed, and pt deemed acceptable VAD candidate. Dental extractions completed 8/6. IABP placed 08/25/22.  HM 3 LVAD implanted on 08/29/22 by Dr Donata Clay under destination therapy criteria. 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 Result negative.  7/25 Admit with cardiogenic shock. Started milrinone and NE. 8/6 S/P 13 teeth extractions  8/9 IABP placed 8/13 S/p HM III LVAD implant + clipping left atrial appendage d/t severe thickening and invagination of mitral valve annulus impeding flows  CT Head 8/14 (initial) 1. 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 ASPECTS is 7. 2. No acute hemorrhage.  CT Angio Head/Neck 1. 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 2. Core infarct of 12 cc (somewhat underestimated compared to aspects) with 90 cc of penumbra. 3. Mild atherosclerosis.  Pt taken emergently to IR for percutaneous right common carotid arteriogram with thrombectomy. Revascularization achieved. Angio-seal closure device applied to left groin- clean, dry, and intact.   CT Head 8/15 @ 0450 Unchanged extent of infarct and hemorrhage in the right MCA distribution including small volume intraventricular clot. No hydrocephalus.  CT Head 8/17 @ 0701 Interval evolution of the right MCA territory infarct with decreased intraparenchymal and intraventricular hemorrhage. No hydrocephalus or midline shift.   Met pt in the gym following therapy session. He is in good spirits today, and states he is feeling stronger. He was able to walk from his room, to the gym, and back to his room. Left wrist brace in place. States he feels his grip strength is improving.   Pt tentative d/c date is October 1.    Blood cultures from 9/6 positive for Enterococcus Faecalis. Subsequent blood cultures  NGTD. ID following recommending 6 weeks of IV Ampicillin and Ceftriaxone followed by chronic suppression with Amoxicillin.   VAD coordinator/nurses working with pt on changing power sources. He is unable to do this due to his left sided weakness. Modification of his power cords was made in hopes he can grip the cords better. Has practice power cords for use by pt. See below for education documentation. He is making progress with therapy.  Pt may not shower at this time.   Vital signs: Temp: 98.4 HR: 86 Doppler Pressure: 90 Automatic BP: 90/76 (83) O2 Sat: 98% on RA Wt: 183.6>191.1>190.9>190.2>184.8>...>163.1>162.5>171.3>172.4>176.6> 180.6>175.1>174.8>176.3>175.7>177.5>173>171.5>167.3>169.9 lbs   LVAD interrogation reveals:  Speed: 5400 Flow: 4.2 Power: 4.0 w PI: 5.3  Alarms: on batteries in the gym Events: on batteries in the gym  Hematocrit: 30 Fixed speed: 5400 Low speed limit: 5100  Drive Line: Existing VAD dressing clean, dry, and intact. Drive line anchor correctly applied. Continue twice a week dressing changes on Monday/Thursday. Next dressing change due 10/12/22 by VAD coordinator or nurse champion only or [place].   Labs:  LDH trend: 596>441>165>384>345>312>312>350>327>309>316>305>298>277>273>298>280>285>251>252>205>  INR trend: 1.4>1.6>1.1>1.1>1.3>1.2>1>1.3>1.4>1.5>1.6>2.8>2.8>2.3>2.2>2.3>2.6>2.6>1.7>2>1.8>1.7>2.1>1.8>2.4  AST/ALT trend: 218/45>96/30>73/23>63/36>58/37>69/50>93/72>123/103>143/150>117/138>119/138>92/127>87/123>50/71>46/65>46/43  Total Bili trend: 4.4>4.6>5.1>1.8>1.9>1.2>1.0>1.5>1.1>0.8>0.9>1.0>0.8  WBC trend: 11.6>13.7>19.6>16.1>23>26.6>21>24.6>18.1>16.5>13.2>13.1>12.5>11.6>10.5>10.8>13.1>9.5>9.1>9.7>7.8>6.7>6.7  Anticoagulation Plan: -INR Goal: 2.0 - 2.5 -ASA Dose: 81 mg  - Coumadin dosing per pharmacy  Blood Products:  IntraOp 8/14: - 4 FFP - 2 Platelets - 2 PRBC - 1 cyro - 449 cc of cellsaver - DDAVP 20 mcg x 1   Device: Medtronic  BiV -Therapies: ON VF ON >207 BPM FVT ON 176-207 BPM VT ON 171-207 BPM  Arrythmias:   Respiratory:   Infection:  09/01/22>> sputum cx>> NGTD>>final 09/01/22>> blood cx>>  NGTD>>final 09/01/22>> urine cx>> NGTD>> final 09/13/22>>blood cxs>>no growth 5 days; final 09/22/22>>blood cxs>> Enterococcus faecalis  09/23/22>> blood cxs>> neg final 09/24/22>> blood cxs>> neg final  Renal:  9/23:7/0.71  Adverse Events on VAD: - 08/30/22: - Developed left sided weakness this am. CTA with R MCA infarct. Taken to IR for thrombectomy   Patient Education: Discharge teaching completed with pt's [name] on 9/5. See separate note for documentation.   Plan/Recommendations:  Please page VAD coordinator for any alarms or VAD equipment issues. Continue twice a week dressing changes on Monday/Thursday by VAD coordinator, Nurse Alla Feeling or Candy Kitchen.  [name] RN VAD Coordinator  Office: [phone]  24/7 Pager: [phone]

## 2022-10-10 NOTE — Progress Notes (Signed)
Physical Therapy Session Note  Patient Details  Name: Charles Holmes MRN: [id number] Date of Birth: [birth year]  {CHL IP REHAB PT TIME CALCULATION:304800500}  Short Term Goals: {WUJ:8119147}  Skilled Therapeutic Interventions/Progress Updates:      Therapy Documentation Precautions:  Precautions Precautions: Sternal, Fall, Other (comment) Precaution Comments: LVAD Restrictions Weight Bearing Restrictions: No RUE Weight Bearing:  (sternal precaution) LUE Weight Bearing:  (sternal precaution) RLE Weight Bearing: Weight bearing as tolerated Other Position/Activity Restrictions: Sternal Precautions General:   Vital Signs: Therapy Vitals Temp: 98.4 F (36.9 C) Temp Source: Oral Pulse Rate: 69 BP: 90/76 Oxygen Therapy SpO2: 96 % Pain:   Mobility:   Locomotion :    Trunk/Postural Assessment :    Balance:   Exercises:   Other Treatments:      Therapy/Group: {Therapy/Group:[id number]}  Loel Dubonnet 10/10/2022, 1:02 PM

## 2022-10-10 NOTE — Progress Notes (Signed)
PROGRESS NOTE   Subjective/Complaints:  Pt feeling well. Doesn't report as much LLQ pain today   ROS: Patient denies fever, rash, sore throat, blurred vision, dizziness, nausea, vomiting, diarrhea, cough, shortness of breath or chest pain, joint or back/neck pain, headache, or mood change. .   Objective:   No results found. Recent Labs    10/09/22 0336 10/10/22 0412  WBC 6.7 6.7  HGB 9.2* 9.3*  HCT 31.6* 32.5*  PLT 253 254     Recent Labs    10/09/22 0336 10/10/22 0412  NA 139 138  K 4.0 4.0  CL 105 104  CO2 29 28  GLUCOSE 75 79  BUN 7 8  CREATININE 0.71 0.77  CALCIUM 8.4* 8.5*    Intake/Output Summary (Last 24 hours) at 10/10/2022 1117 Last data filed at 10/10/2022 0751 Gross per 24 hour  Intake 1143.65 ml  Output 1000 ml  Net 143.65 ml        Physical Exam: Vital Signs Blood pressure 90/76, pulse 99, temperature 98.3 F (36.8 C), resp. rate 18, height 5\' 7"  (1.702 m), weight 77.1 kg, SpO2 94%.   Constitutional: No distress . Vital signs reviewed. HEENT: NCAT, EOMI, oral membranes moist Neck: supple Cardiovascular: LVAD hum    Respiratory/Chest: CTA Bilaterally without wheezes or rales. Normal effort    GI/Abdomen: BS +,  non-distended, sl tenderness near drive line Ext: no clubbing, cyanosis, or edema Psych: pleasant and cooperative  Skin: chest incisions clean healing well , LVAD drive site clean/dressed Neuro:  Alert and oriented x 3. Fair insight and awareness. Intact Memory. Normal language and speech. Cranial nerve exam unremarkable except for mild left central 7. MMT: LUE delt, bi, tri, grip   3/5 prox to distal. LLE 4-/5 throughout. Pt with intact  LT sense on left but feels a little different compared to R side . No abnl resting tone. DTR's tr to1+.  Neuro exam stable 9/24 Musculoskeletal: Full ROM, No pain with AROM or PROM in the neck, trunk, or extremities. Posture appropriate      Assessment/Plan: 1. Functional deficits which require 3+ hours per day of interdisciplinary therapy in a comprehensive inpatient rehab setting. Physiatrist is providing close team supervision and 24 hour management of active medical problems listed below. Physiatrist and rehab team continue to assess barriers to discharge/monitor patient progress toward functional and medical goals  Care Tool:  Bathing    Body parts bathed by patient: Chest, Left arm, Abdomen, Front perineal area, Face   Body parts bathed by helper: Buttocks, Right upper leg, Left upper leg, Right lower leg, Left lower leg, Right arm     Bathing assist Assist Level: Maximal Assistance - Patient 24 - 49%     Upper Body Dressing/Undressing Upper body dressing   What is the patient wearing?: Pull over shirt    Upper body assist Assist Level: Maximal Assistance - Patient 25 - 49%    Lower Body Dressing/Undressing Lower body dressing      What is the patient wearing?: Pants     Lower body assist Assist for lower body dressing: Minimal Assistance - Patient > 75%     Toileting Toileting  Toileting assist Assist for toileting: Maximal Assistance - Patient 25 - 49%     Transfers Chair/bed transfer  Transfers assist  Chair/bed transfer activity did not occur: Safety/medical concerns (fatigue)  Chair/bed transfer assist level: Contact Guard/Touching assist     Locomotion Ambulation   Ambulation assist   Ambulation activity did not occur: Safety/medical concerns (fatigue)  Assist level: Contact Guard/Touching assist Assistive device: Walker-rolling Max distance: 150'   Walk 10 feet activity   Assist  Walk 10 feet activity did not occur: Safety/medical concerns (fatigue)  Assist level: Contact Guard/Touching assist Assistive device: Walker-rolling   Walk 50 feet activity   Assist Walk 50 feet with 2 turns activity did not occur: Safety/medical concerns  Assist level: Contact  Guard/Touching assist Assistive device: Walker-rolling    Walk 150 feet activity   Assist Walk 150 feet activity did not occur: Safety/medical concerns  Assist level: Contact Guard/Touching assist Assistive device: Walker-rolling    Walk 10 feet on uneven surface  activity   Assist Walk 10 feet on uneven surfaces activity did not occur: Safety/medical concerns (fatigue)         Wheelchair     Assist Is the patient using a wheelchair?: Yes Type of Wheelchair: Manual Wheelchair activity did not occur: Safety/medical concerns (fatigue)         Wheelchair 50 feet with 2 turns activity    Assist    Wheelchair 50 feet with 2 turns activity did not occur: Safety/medical concerns       Wheelchair 150 feet activity     Assist  Wheelchair 150 feet activity did not occur: Safety/medical concerns       Blood pressure 90/76, pulse 99, temperature 98.3 F (36.8 C), resp. rate 18, height 5\' 7"  (1.702 m), weight 77.1 kg, SpO2 94%.  Medical Problem List and Plan: 1. Functional deficits secondary to acute right MCA infarct/ LVAD pt             -patient may shower             -ELOS/Goals: modI/S 10/17/22   -Continue CIR therapies including PT, OT, and SLP    2.  Antithrombotics: -DVT/anticoagulation:  Pharmaceutical: Coumadin per pharmacy -9/24 INR up to 2.4               -antiplatelet therapy: Aspirin 81 mg daily   3. Pain Management: Tylenol and Tramadol as needed   -LUQ Is due to LVAD driveline--reposition self as possible when needed. 4. Mood/Behavior/Sleep: delirium/insomnia - team to provide ongoing ego support             -continue mirtazapine 7.5 mg q HS             -continue sertraline 50 mg daily             -continue melatonin 3 mg q HS -Started Trazodone 50mg  at bedtime on 09/29/22             -antipsychotic agents: n/a -9/13- mood somewhat better than when saw pt Tuesday- con't regimen and monitor -09/30/22 not sleeping well, increased  Melatonin to 5mg  QHS; if not helping, may consider restarting mirtazapine as above 9/18 poor sleep related to freq urination related to lasix , intake of fluids is minimal may need to use comp hose to prvent accumulation during the day and prevent nocturnal diuresis  -10/07/22 mirtazepine restarted 7.5mg  QHS -- improved sleep   5. Neuropsych/cognition: This patient is capable of making decisions on his own behalf.  6. Skin/Wound Care: Routine skin care checks   7. Fluids/Electrolytes/Nutrition: strict Is and Os and follow-up chemistries             -poor PO intake on Marinol             -continue supplements   -9/12 labs reviewed. low albumin still continue interventions above -9/24 labs stable, daily labs per HF team-->MWF  8: Hypotension but difficult to assess due to LVAD    9/13- BP soft, but denies orthostatic hypotension -9/14-15/24 BPs generally stable/improving; was previously on midodrine, but per HF team pt was stable off it;  Difficult to measure BP due to LVAD as well as LUE edema post CVA -9/24 bp's better off of lasix Vitals:   10/08/22 1945 10/09/22 0005 10/09/22 0452 10/09/22 0742  BP: 106/75 104/86 103/78 103/79   10/09/22 0800 10/09/22 1200 10/09/22 1443 10/09/22 1604  BP: 98/73 94/68 92/75  102/72   10/09/22 2013 10/10/22 0021 10/10/22 0434 10/10/22 0736  BP: 91/72 (!) 82/63 97/83 90/76      9: Hyperlipidemia: continue atorvastatin 80mg  daily   10: Enterococcus bacteremia: 6 weeks IV antibiotic, ceftriaxone and ampicillin last dose 10/19 will go home with PICC and HH             -follow-up with ID  -09/30/22 PICC placed   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: Home meds: Coreg 3.125, Lasix 40mg , Lostartan 25mg , Aldactone 12.5, Farxiga 10mg , mexilitene --holding all currently   12: Acute on chronic systolic heart failure with cardiogenic shock s/p LVAD             -daily weights balanced so far             -continue digoxin 0.125 mg daily             -continue mexiletine 250 mg  BID -continue midodrine 5 mg q 8 hours--not ordered, per HF team on 9/13 pt was stable off Midodrine; hold for now             -continue sildenafil 20 mg q 8 hours             -continue warfarin per pharmacy Managed by Heart Failure team - coordinating with primary service for activity tolerance monitoring  -10/10/22 wt stable Filed Weights   10/08/22 0419 10/09/22 0452 10/10/22 0451  Weight: 76.1 kg 75.9 kg 77.1 kg    13: Hx of VT: continue mexiletine, digoxin; has ICD   14: DM-2: A1c = 5.5%; monitor PO intake; DC SSI (Farxiga at home)  15: OSA: needs outpatient sleep study   16: Anemia, iron deficiency: follow-up CBC--most recent hgb 9.5--lab pending still today?  -10/10/22 Hgb stable 9.3, monitor    Latest Ref Rng & Units 10/10/2022    4:12 AM 10/09/2022    3:36 AM 10/08/2022    4:35 AM  CBC  WBC 4.0 - 10.5 K/uL 6.7  6.7  6.7   Hemoglobin 13.0 - 17.0 g/dL 9.3  9.2  9.3   Hematocrit 39.0 - 52.0 % 32.5  31.6  32.2   Platelets 150 - 400 K/uL 254  253  263       17: Severe COPD:              -continue Ellipta one puff daily   18: S/p tooth extractions this admission: monitor PO tolerance   19: Glaucoma: continue Cosopt and Xalatan   20.  Candidiasis in groin start nystatin powder   21.  Dysuria- UA neg , on  broad spectrum IV abx for bacteremia-- resolved 10/07/22  22.  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   - pt on warfarin which is now therapeutic.   LOS: 13 days A FACE TO FACE EVALUATION WAS PERFORMED  Ranelle Oyster 10/10/2022, 11:17 AM

## 2022-10-10 NOTE — Progress Notes (Signed)
ANTICOAGULATION CONSULT NOTE - Follow-up  Pharmacy Consult for warfarin Indication:  LVAD  Allergies  Allergen Reactions   Pacerone [Amiodarone] Other (See Comments)    Severe tremors   Percocet [Oxycodone-Acetaminophen] Itching    Patient Measurements: Height: 5\' 7"  (170.2 cm) Weight: 77.1 kg (169 lb 15.6 oz) IBW/kg (Calculated) : 66.1 Heparin Dosing Weight: 87kg  Vital Signs: Temp: 98.3 F (36.8 C) (09/24 0434) BP: 90/76 (09/24 0736) Pulse Rate: 99 (09/24 0736)  Labs: Recent Labs    10/08/22 0435 10/09/22 0336 10/10/22 0412  HGB 9.3* 9.2* 9.3*  HCT 32.2* 31.6* 32.5*  PLT 263 253 254  LABPROT 22.4* 21.4* 26.6*  INR 1.9* 1.8* 2.4*  CREATININE 0.70 0.71 0.77    Estimated Creatinine Clearance: 93 mL/min (by C-G formula based on SCr of 0.77 mg/dL).   Medical History: Past Medical History:  Diagnosis Date   CAD (coronary artery disease)    CHF (congestive heart failure) (HCC)    COPD (chronic obstructive pulmonary disease) (HCC)    GERD (gastroesophageal reflux disease)    Hyperlipidemia    Hypertension    LVAD (left ventricular assist device) present (HCC)    Stroke (HCC)    Systolic heart failure (HCC) 2021   LVEF 18%, RVEF 38% on cardiac MRI 12/19/2019. possible cardiac sarcoidosis.   Wide-complex tachycardia 2021   wears LifeVest     Assessment: 57 yoM on apixaban PTA for hx AF admitted for LVAD workup. Pt s/p HM3 implant on 8/13 c/b acute CVA postop, on aspirin 81 mg daily. Started low dose heparin + warfarin started 8/19. Low-dose heparin was stopped 8/31.  INR therapeutic at 2.4 (+0.6). With large increase in INR, will need dose reduction for tonight. CBC stable. 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   Goal of Therapy:  INR 2-2.5 Monitor platelets by anticoagulation protocol: Yes   Plan:  Warfarin 2mg  x1 Monitor daily INR, CBC   Wilmer Floor, PharmD PGY2 Cardiology Pharmacy Resident Please check AMION for all [facility]  Pharmacy numbers 10/10/2022

## 2022-10-10 NOTE — Progress Notes (Signed)
Patient ID: Charles Holmes, male   DOB: 1965/02/25, 57 y.o.   MRN: [id number]     Advanced Heart Failure Rounding Note  PCP-Cardiologist: Charles Herrlich, MD  [facility]: Dr. Gala Holmes   Subjective:    Admitted to CIR on 09/11  INR 2.4  Working in the gym this morning, no complaints.   LVAD Interrogation HM III: Speed: 5400  Flow: 4.5  PI: 5.9 Power: 4.2  VAD interrogated personally. Parameters stable.  Objective:   Weight Range: 77.1 kg Body mass index is 26.62 kg/m.   Vital Signs:   Temp:  [98.1 F (36.7 C)-98.4 F (36.9 C)] 98.3 F (36.8 C) (09/24 0434) Pulse Rate:  [63-99] 99 (09/24 0736) Resp:  [16-18] 18 (09/24 0736) BP: (82-102)/(63-83) 90/76 (09/24 0736) SpO2:  [93 %-98 %] 94 % (09/24 0822) Weight:  [77.1 kg] 77.1 kg (09/24 0451) Last BM Date : 10/10/22  Weight change: Filed Weights   10/08/22 0419 10/09/22 0452 10/10/22 0451  Weight: 76.1 kg 75.9 kg 77.1 kg   Intake/Output:   Intake/Output Summary (Last 24 hours) at 10/10/2022 1113 Last data filed at 10/10/2022 0751 Gross per 24 hour  Intake 1143.65 ml  Output 1000 ml  Net 143.65 ml     Physical Exam   General: Well appearing this am. NAD.  HEENT: Normal. Neck: Supple, JVP 7-8 cm. Carotids OK.  Cardiac:  Mechanical heart sounds with LVAD hum present.  Lungs:  CTAB, normal effort.  Abdomen:  NT, ND, no HSM. No bruits or masses. +BS  LVAD exit site: Well-healed and incorporated. Dressing dry and intact. No erythema or drainage. Stabilization device present and accurately applied. Driveline dressing changed daily per sterile technique. Extremities:  Left arm swelling Neuro:  Weak on left side.    Patient Profile  [name] is a 57 y.o. 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 Admitted with cardiogenic shock, stabilized and underwent HM3 LVAD. Post implant course c/b acute CVA and enterococcus faecalis bacteremia.   Assessment/Plan  1.   Acute on chronic Systolic HF-->Cardiogenic Shock  - Diagnosed 11/2019. Presented with VT. LHC 70% LAD  - cMRI 12/21 concerning for sarcoid and EF 18%.  - PET 2/22 at [facility] EF 25% + active sarcoid - Medtronic CRT-D upgrade in 06/08/21 - Echo 07/10/22: EF <20%, RV okay, mod pericardial effusion, mod MR/TR - Admitted 07/25 with cardiogenic shock. - RHC: Nonobstructive CAD, severely elevated filling pressures and low Fick CO/CI (2.7/1.4) - 08/13 HM III LVAD implant + clipping LAA d/t severe thickening and invagination of mitral valve annulus impeding flows.  - Apical core sent - no mention of sarcoid - Echo 8/26 mod-sev RV dysfunction. Speed increased to 5400 8/26 - Ramp Echo 9/4, speed increased to 5400  - MAP 70s. Volume status looks good off lasix.  - On Sildenafil 20 mg tid.  - Continue spironolactone 25mg  daily - Continue digoxin 0.125 mg daily - Can transition to MWF labs  2. HM-3 LVAD - VAD interrogated personally. Parameters stable. - LDH stable - Continue ASA 81 - INR 2.4 decrease warfarin to 3mg  daily as appetite continues to improve   3.  Acute stroke - Hx CVA 06/24 -Admitted 06/24 w/ R MCA stroke. S/p TPA and mechanical clot extraction. No residual deficits. Likely cardioembolic in setting of severe LV dysfunction. - Developed left sided weakness 08/14. CTA with R MCA infarct. Taken to IR for thrombectomy - Repeat CT head with  small to moderate size hemorrhagic conversion.  - repeat head CT on 8/17 w/ improved hemorrhagic CVA - Back on warfarin.  - Functional deficits improving - Continue ASA 81 mg daily - Appreciate CIR  4. Enterococcus faecalis bacteremia - Bcx 2/2 on 9/6 - ID consult 9/6 -> ampicillin and ceftriaxone - TEE recommended but doubt it will change management as VAD cannot be removed and will require IV abx followed by long-term suppression - Echo 09/10 - no obvious vegetations -  Repeat BCx 9/8 NGTD. - ID recommending 6 weeks IV amp/ceftriaxone followed  by long-term suppression with amoxicillin. Stop date 11/04/22   5. Hx VT - ln setting of potential sarcoid heart disease  - Off amio due to tremor. Continue mexiletine, ICD in place   6. CAD - LHC 12/07/19 70-% LAD, no intervention - LHC 8/24 non obstructive CAD.  - Continue statin. On aspirin for VAD. No s/s angina  7. 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   8. 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 Iron deficiency anemia/ Post-op anemia - Has had IV Fe.    10. Pulmonary  - PFTs with severe obstructive defect, response to bronchodilator. FEV1 1.04L, FEV1/FVC 48% - Stable  11. Psych - Depression and delirium post VAD implant. Prolonged hospital course.  - Improving - Continue sertraline  12. Left arm swelling - 9/20 LUE dopplers with chronic superficial vein thrombosis.    Charles Holmes 10/10/2022

## 2022-10-10 NOTE — Progress Notes (Incomplete)
Occupational Therapy Session Note  Patient Details  Name: Charles Holmes MRN: [id number] Date of Birth: Dec 07, 1965  {CHL IP REHAB OT TIME CALCULATIONS:[id number]}   Short Term Goals: Week 2:  OT Short Term Goal 1 (Week 2): Pt will demonstrate improved L FMC to fasten and unfasten LVAD connectors in less than 1 minute. OT Short Term Goal 2 (Week 2): Pt will demonstrate improved L hand function to grasp pants to pull over L side of hips with min A. OT Short Term Goal 3 (Week 2): Pt will demonstrate improved memory to recall steps of LVAD battery change with min cues. OT Short Term Goal 4 (Week 2): Pt will complete BSC or toilet transfers with min A.  Skilled Therapeutic Interventions/Progress Updates:    Pt received in recliner dressed and ready for therapy. Pt had completed toileting and self care tasks.  Focus of session on LUE AROM and strength. Pt worked on A/rom using 1 lb dowel bar, reaching for cones,  tilting RW with B hands on each side of walker, grasp control   Discussed DME needs. Pt would benefit from a BSC to use during the night as LVAD cords may not reach the bathroom.  He can not shower now but in the future his tub with a tub bench would be the safest option.  His walk in shower is very small and no room for a seat.  Pt is familiar with the tub bench as his sister uses one.    Therapy Documentation Precautions:  Precautions Precautions: Sternal, Fall, Other (comment) Precaution Comments: LVAD Restrictions Weight Bearing Restrictions: No RUE Weight Bearing:  (sternal precaution) LUE Weight Bearing:  (sternal precaution) RLE Weight Bearing: Weight bearing as tolerated Other Position/Activity Restrictions: Sternal Precautions    Vital Signs: Therapy Vitals Pulse Rate: 99 Resp: 18 BP: 90/76 Oxygen Therapy SpO2: 94 % O2 Device: Room Air Pain: Pain Assessment Pain Score: 0-No pain ADL: ADL Eating: Minimal assistance Where Assessed-Eating: Bed level Grooming:  Minimal assistance Where Assessed-Grooming: Edge of bed Upper Body Bathing: Moderate assistance Where Assessed-Upper Body Bathing: Edge of bed Lower Body Bathing: Maximal assistance Where Assessed-Lower Body Bathing: Edge of bed Upper Body Dressing: Moderate assistance Where Assessed-Upper Body Dressing: Edge of bed Lower Body Dressing: Moderate assistance Where Assessed-Lower Body Dressing: Edge of bed Toileting: Maximal assistance Where Assessed-Toileting: Bed level Toilet Transfer: Minimal assistance Toilet Transfer Method: Ambulating, Stand pivot Toilet Transfer Equipment: Grab bars, Bedside commode Tub/Shower Transfer: Not assessed Film/video editor: Not assessed    Therapy/Group: Individual Therapy  [name] 10/10/2022, 8:55 AM

## 2022-10-10 NOTE — Progress Notes (Signed)
  Subjective: Happy that he could walk to the PT gym today VAD parameters satisfactory  Objective: Vital signs in last 24 hours: Temp:  [98.1 F (36.7 C)-99.1 F (37.3 C)] 99.1 F (37.3 C) (09/24 1454) Pulse Rate:  [63-99] 69 (09/24 1454) Resp:  [16-18] 16 (09/24 1454) BP: (82-102)/(63-83) 89/65 (09/24 1454) SpO2:  [93 %-98 %] 96 % (09/24 1454) Weight:  [77.1 kg] 77.1 kg (09/24 0451)  Hemodynamic parameters for last 24 hours:  nsr  afebrile Intake/Output from previous day: 09/23 0701 - 09/24 0700 In: 1143.7 [P.O.:360; I.V.:433.7; IV Piggyback:350] Out: 1300 [Urine:1300] Intake/Output this shift: Total I/O In: 480 [P.O.:480] Out: -  EXAM Alert, comfortable Surgical incisions clean VAD tunnel wound clean, granulating Normal VAD hum Abdomen soft No edema Lab Results: Recent Labs    10/09/22 0336 10/10/22 0412  WBC 6.7 6.7  HGB 9.2* 9.3*  HCT 31.6* 32.5*  PLT 253 254   BMET:  Recent Labs    10/09/22 0336 10/10/22 0412  NA 139 138  K 4.0 4.0  CL 105 104  CO2 29 28  GLUCOSE 75 79  BUN 7 8  CREATININE 0.71 0.77  CALCIUM 8.4* 8.5*    PT/INR:  Recent Labs    10/10/22 0412  LABPROT 26.6*  INR 2.4*   ABG    Component Value Date/Time   PHART 7.365 08/31/2022 1504   HCO3 33.2 (H) 09/12/2022 1108   HCO3 31.2 (H) 09/12/2022 1108   TCO2 35 (H) 09/12/2022 1108   TCO2 33 (H) 09/12/2022 1108   ACIDBASEDEF 4.0 (H) 08/31/2022 1504   O2SAT 66.9 09/16/2022 0539   CBG (last 3)  No results for input(s): "GLUCAP" in the last 72 hours.  Assessment/Plan: S/P Heatmate 3 VAD Doing well now 1 week until DC home VAD training with family ongoing with VAD coordinators DC instructions about sternal precautions d/w patient Appreciate CIR Team efforts  LOS: 13 days    Lovett Sox 10/10/2022

## 2022-10-10 NOTE — Progress Notes (Signed)
Speech Language Pathology Daily Session Note  Patient Details  Name: Charles Holmes MRN: [id number] Date of Birth: Sep 19, 1965  Today's Date: 10/10/2022 SLP Individual Time: 4132-4401 SLP Individual Time Calculation (min): 40 min  Short Term Goals: Week 2: SLP Short Term Goal 1 (Week 2): Patient will attend to left field of enviornment during functional tasks with Mod I SLP Short Term Goal 2 (Week 2): Patient will utilize speech intelligibility strategies at the sentence level to achieve ~80% intelligibility with Min verbal cues. SLP Short Term Goal 3 (Week 2): Patient will recall reasoning for specific LVAD alarms with Min verbal and visual cues. SLP Short Term Goal 4 (Week 2): Patient will recall sequence to switching to LVAD batteries with Min verbal and visual cues.  Skilled Therapeutic Interventions: Skilled treatment session focused on cognitive and communication goals. Upon arrival, patient was awake in bed. Patient requested to use the bathroom and required encouragement to switch the LVAD to batteries as he wanted to stay on wall power and hope that's cords could reach. SLP provided education that he will need to switch to batteries at home when he needs to use the commode. Patient verbalized understanding and eventually agreeable. TED hoes and socks donned and patient switched to batteries with Min verbal cues needed for verbal sequencing. Patient was continent of bowel and bladder with Min verbal cues needed for use of sternal precautions throughout transfers. Patient transferred to recliner and participated in speech intelligibility tasks at the sentence level with Min verbal cues for use of strategies to achieve 80% intelligibility. Patient left upright in recliner with alarm on and all needs within reach. Continue with current plan of care.      Pain Pain Assessment Pain Score: 0-No pain  Therapy/Group: Individual Therapy  Tryphena Perkovich 10/10/2022, 10:35 AM

## 2022-10-10 NOTE — Progress Notes (Signed)
Occupational Therapy Session Note  Patient Details  Name: Charles Holmes MRN: [id number] Date of Birth: 09/27/1965  Today's Date: 10/10/2022 OT Individual Time: 4098-1191 OT Individual Time Calculation (min): 45 min    Short Term Goals: Week 2:  OT Short Term Goal 1 (Week 2): Pt will demonstrate improved L FMC to fasten and unfasten LVAD connectors in less than 1 minute. OT Short Term Goal 2 (Week 2): Pt will demonstrate improved L hand function to grasp pants to pull over L side of hips with min A. OT Short Term Goal 3 (Week 2): Pt will demonstrate improved memory to recall steps of LVAD battery change with min cues. OT Short Term Goal 4 (Week 2): Pt will complete BSC or toilet transfers with min A.  Skilled Therapeutic Interventions/Progress Updates:    Pt received in recliner dressed and ready for therapy. Pt had completed toileting and self care tasks.  Focus of session on LUE AROM and strength. Pt worked on A/rom using 1 lb dowel bar, reaching for cones,  tilting RW with B hands on each side of walker, grasp control with cones and reaching hand in various directions.  Pronation/supination with bar.  Lateral pinch with picking up playing cards. (A favorite hobby of pt). Pt worked from seated on exercises.  He did stand for 3 minutes while working on a/arom L shoulder reaching above shoulder level.   Discussed DME needs. Pt would benefit from a BSC to use during the night as LVAD cords may not reach the bathroom.  He can not shower now but in the future his tub with a tub bench would be the safest option.  His walk in shower is very small and no room for a seat.  Pt is familiar with the tub bench as his sister uses one.    Hand off to PT for next session.   Therapy Documentation Precautions:  Precautions Precautions: Sternal, Fall, Other (comment) Precaution Comments: LVAD Restrictions Weight Bearing Restrictions: No RUE Weight Bearing:  (sternal precaution) LUE Weight Bearing:   (sternal precaution) RLE Weight Bearing: Weight bearing as tolerated Other Position/Activity Restrictions: Sternal Precautions    Vital Signs: Therapy Vitals Pulse Rate: 99 Resp: 18 BP: 90/76 Oxygen Therapy SpO2: 94 % O2 Device: Room Air Pain: Pain Assessment Pain Score: 0-No pain ADL: ADL Eating: Minimal assistance Where Assessed-Eating: Bed level Grooming: Minimal assistance Where Assessed-Grooming: Edge of bed Upper Body Bathing: Moderate assistance Where Assessed-Upper Body Bathing: Edge of bed Lower Body Bathing: Maximal assistance Where Assessed-Lower Body Bathing: Edge of bed Upper Body Dressing: Moderate assistance Where Assessed-Upper Body Dressing: Edge of bed Lower Body Dressing: Moderate assistance Where Assessed-Lower Body Dressing: Edge of bed Toileting: Maximal assistance Where Assessed-Toileting: Bed level Toilet Transfer: Minimal assistance Toilet Transfer Method: Ambulating, Stand pivot Toilet Transfer Equipment: Grab bars, Bedside commode Tub/Shower Transfer: Not assessed Film/video editor: Not assessed    Therapy/Group: Individual Therapy  Vanessia Bokhari 10/10/2022, 9:45 AM

## 2022-10-10 NOTE — Progress Notes (Signed)
Physical Therapy Session Note  Patient Details  Name: Charles Holmes MRN: [id number] Date of Birth: July 18, 1965  Today's Date: 10/10/2022 PT Individual Time: 0916-1002 PT Individual Time Calculation (min): 46 min   Short Term Goals: Week 1:  PT Short Term Goal 1 (Week 1): Pt will require min A consisntely for bed mobility PT Short Term Goal 1 - Progress (Week 1): Met PT Short Term Goal 2 (Week 1): Pt will require mod A with sit to stand with LRAD PT Short Term Goal 2 - Progress (Week 1): Met PT Short Term Goal 3 (Week 1): Pt will require max A x 1 for gait x 10 ft with LRAD PT Short Term Goal 3 - Progress (Week 1): Met  Skilled Therapeutic Interventions/Progress Updates:  Patient seated upright in w/c on entrance to room with OT. Took over session from OT. Patient alert and agreeable to PT session.   Patient with no pain complaint at start of session. LVAD already on battery power.  Therapeutic Activity: Transfers: Pt performed sit<>stand and stand pivot transfers throughout session with supervision. Provided vc for foot positioning and hip hinge for adequate forward weight shift.  Gait Training:  Pt ambulated >115ft using IV pole for AD with close supervision. Requires several brief standing rest breaks to reach distance. Demonstrated improving maintenance of straight path with vc for focus on balance. Return amb bout to room with one very brief standing rest break and relates fatigue upon reaching seat on EOB.   Neuromuscular Re-ed: NMR facilitated during session with focus on standing balance. Pt guided in challenge to proactive/ reactive balance with bean bag toss to target board. Pt guided in first bout with static stance and requires a few throws to learn required balance needed with arm swing. Next bout with LLE step and coordinated RUE underhand toss to board with improved performance and no LOB. Third bout with RLE step forward and LUE toss of bean bag. Pt is unable to statically  hold more than one bean bag in L hand, but is able to effectively underhand toss and release to effectively throw to target board. NMR performed for improvements in motor control and coordination, balance, sequencing, judgement, and self confidence/ efficacy in performing all aspects of mobility at highest level of independence.   Patient supine in bed at end of session with brakes locked, bed alarm set, and all needs within reach.   Therapy Documentation Precautions:  Precautions Precautions: Sternal, Fall, Other (comment) Precaution Comments: LVAD Restrictions Weight Bearing Restrictions: No RUE Weight Bearing:  (sternal precaution) LUE Weight Bearing:  (sternal precaution) RLE Weight Bearing: Weight bearing as tolerated Other Position/Activity Restrictions: Sternal Precautions  Pain: Pain Assessment Pain Score: 0-No pain  Therapy/Group: Individual Therapy  Loel Dubonnet PT, DPT, CSRS 10/10/2022, 10:30 AM

## 2022-10-11 LAB — CBC
HCT: 32.4 % — ABNORMAL LOW (ref 39.0–52.0)
Hemoglobin: 9.2 g/dL — ABNORMAL LOW (ref 13.0–17.0)
MCH: 26.7 pg (ref 26.0–34.0)
MCHC: 28.4 g/dL — ABNORMAL LOW (ref 30.0–36.0)
MCV: 94.2 fL (ref 80.0–100.0)
Platelets: 243 10*3/uL (ref 150–400)
RBC: 3.44 MIL/uL — ABNORMAL LOW (ref 4.22–5.81)
RDW: 18.1 % — ABNORMAL HIGH (ref 11.5–15.5)
WBC: 6.4 10*3/uL (ref 4.0–10.5)
nRBC: 0 % (ref 0.0–0.2)

## 2022-10-11 LAB — BASIC METABOLIC PANEL
Anion gap: 10 (ref 5–15)
BUN: 11 mg/dL (ref 6–20)
CO2: 26 mmol/L (ref 22–32)
Calcium: 8.5 mg/dL — ABNORMAL LOW (ref 8.9–10.3)
Chloride: 103 mmol/L (ref 98–111)
Creatinine, Ser: 0.58 mg/dL — ABNORMAL LOW (ref 0.61–1.24)
GFR, Estimated: >60 mL/min (ref >60)
Glucose, Bld: 64 mg/dL — ABNORMAL LOW (ref 70–99)
Potassium: 3.8 mmol/L (ref 3.5–5.1)
Sodium: 139 mmol/L (ref 135–145)

## 2022-10-11 LAB — PROTIME-INR
INR: 2.3 — ABNORMAL HIGH (ref 0.8–1.2)
Prothrombin Time: 25.6 seconds — ABNORMAL HIGH (ref 11.4–15.2)

## 2022-10-11 MED ORDER — WARFARIN SODIUM 3 MG PO TABS
3.0000 mg | ORAL_TABLET | Freq: Every day | ORAL | Status: DC
  Filled 2022-10-11: qty 1

## 2022-10-11 MED ORDER — WARFARIN SODIUM 3 MG PO TABS: 3.0000 mg | ORAL_TABLET | Freq: Once | ORAL | Status: DC

## 2022-10-11 NOTE — Progress Notes (Signed)
Physical Therapy Session Note  Patient Details  Name: Charles Holmes MRN: [id number] Date of Birth: [birth year]  Today's Date: 10/11/2022 PT Individual Time: 2130-8657 PT Individual Time Calculation (min): 40 min   Short Term Goals: Week 2:  PT Short Term Goal 1 (Week 2): Pt will require consistent supervision for bed mobility. PT Short Term Goal 2 (Week 2): Pt will require consistent CGA for sit to stand with LRAD. PT Short Term Goal 3 (Week 2): Pt will require supervision for ambulation >100 ft with LRAD and no rest breaks.  Skilled Therapeutic Interventions/Progress Updates:      Therapy Documentation Precautions:  Precautions Precautions: Sternal, Fall, Other (comment) Precaution Comments: LVAD Restrictions Weight Bearing Restrictions: No RUE Weight Bearing:  (sternal precaution) LUE Weight Bearing:  (sternal precaution) RLE Weight Bearing: Weight bearing as tolerated Other Position/Activity Restrictions: Sternal Precautions  Pt agreeable to PT session with encouragement as he reports he was tired from previous therapy sessions. Pt willing to participate in lower body strength exercises at bed level. Pt without reports of pain. Pt performed gluteal bridges with ball to facilitate increased adductor activation 2 x 8 followed by single leg bridges 2 x 5 bilaterally. Pt willing to ambulate to dayroom for OT/Recreational Therapy group. Pt supervision with supine>sit>stand and CGA/SBA with gait ~150 ft to dayroom. Pt left at group setting.     Therapy/Group: Individual Therapy  Truitt Leep Truitt Leep PT, DPT  10/11/2022, 10:34 AM

## 2022-10-11 NOTE — Progress Notes (Signed)
ANTICOAGULATION CONSULT NOTE - Follow-up  Pharmacy Consult for warfarin Indication:  LVAD  Allergies  Allergen Reactions   Pacerone [Amiodarone] Other (See Comments)    Severe tremors   Percocet [Oxycodone-Acetaminophen] Itching    Patient Measurements: Height: 5\' 7"  (170.2 cm) Weight: 72.6 kg (160 lb 0.9 oz) IBW/kg (Calculated) : 66.1 Heparin Dosing Weight: 87kg  Vital Signs: Temp: 98.1 F (36.7 C) (09/25 0412) BP: 107/85 (09/25 0800) Pulse Rate: 108 (09/25 0412)  Labs: Recent Labs    10/09/22 0336 10/10/22 0412 10/11/22 0436  HGB 9.2* 9.3* 9.2*  HCT 31.6* 32.5* 32.4*  PLT 253 254 243  LABPROT 21.4* 26.6* 25.6*  INR 1.8* 2.4* 2.3*  CREATININE 0.71 0.77 0.58*    Estimated Creatinine Clearance: 93 mL/min (A) (by C-G formula based on SCr of 0.58 mg/dL (L)).   Medical History: Past Medical History:  Diagnosis Date   CAD (coronary artery disease)    CHF (congestive heart failure) (HCC)    COPD (chronic obstructive pulmonary disease) (HCC)    GERD (gastroesophageal reflux disease)    Hyperlipidemia    Hypertension    LVAD (left ventricular assist device) present (HCC)    Stroke (HCC)    Systolic heart failure (HCC) 2021   LVEF 18%, RVEF 38% on cardiac MRI 12/19/2019. possible cardiac sarcoidosis.   Wide-complex tachycardia 2021   wears LifeVest     Assessment: 57 yoM on apixaban PTA for hx AF admitted for LVAD workup. Pt s/p HM3 implant on 8/13 c/b acute CVA postop, on aspirin 81 mg daily. Started low dose heparin + warfarin started 8/19. Low-dose heparin was stopped 8/31.  INR therapeutic at 2.3 (-0.1). With large increase in INR. CBC stable. 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   Goal of Therapy:  INR 2-2.5 Monitor platelets by anticoagulation protocol: Yes   Plan:  Warfarin 3mg  x1 Monitor daily INR, CBC   Wilmer Floor, PharmD PGY2 Cardiology Pharmacy Resident Please check AMION for all [facility] Pharmacy  numbers 10/11/2022

## 2022-10-11 NOTE — Progress Notes (Signed)
Physical Therapy Session Note  Patient Details  Name: Charles Holmes MRN: [id number] Date of Birth: [birth year]  Today's Date: 10/11/2022 PT Individual Time: 4098-1191 PT Individual Time Calculation (min): 63 min   Short Term Goals: Week 1:  PT Short Term Goal 1 (Week 1): Pt will require min A consisntely for bed mobility PT Short Term Goal 1 - Progress (Week 1): Met PT Short Term Goal 2 (Week 1): Pt will require mod A with sit to stand with LRAD PT Short Term Goal 2 - Progress (Week 1): Met PT Short Term Goal 3 (Week 1): Pt will require max A x 1 for gait x 10 ft with LRAD PT Short Term Goal 3 - Progress (Week 1): Met Week 2:  PT Short Term Goal 1 (Week 2): Pt will require consistent supervision for bed mobility. PT Short Term Goal 2 (Week 2): Pt will require consistent CGA for sit to stand with LRAD. PT Short Term Goal 3 (Week 2): Pt will require supervision for ambulation >100 ft with LRAD and no rest breaks.  Skilled Therapeutic Interventions/Progress Updates:  Patient seated upright in w/c with battery packs donned on entrance to room. Patient alert and agreeable to PT session.   Patient with no pain complaint at start of session.  Therapeutic Activity: Transfers: Pt performed sit<>stand and stand pivot transfers throughout session with supervision d/t lines from IV pole. Provided verbal cues for safety awareness. Ambulatory toilet transfer for standing urinary void at toilet. Able to ambulate following toileting to sink and wash hands at sink with vc for using LUE.  Gait Training:  Pt taken outside of [facility] to hold therapy session outside on request. Pt ambulated 112 ft using IV pole with close supervision. Halfway through amb bout, pt guided in 2x10 standing heel raises and 2x5 minisquats. Following seated rest break, pt ambulates over uneven surfaces >90 feet again using IV pole for AD with supervision. Demonstrated slightly flexed posture throughout. Provided vc for maintaining IV  pole over cracks in pavement as well as outside of line of travel.  Bed Mobility: Pt performed supine <> sit with Mod I/ supervision requiring vc for position of LUE d/t decreased awareness.  Patient supine in bed at end of session with brakes locked, bed alarm set, and all needs within reach. Left on battery packs as he is scheduled for group session after lunch. NT and RN notified as to battery pack use in bed.   Pt again c/o inability to go to bathroom on his own when he has to go. Relates that he has ~60min that he can wait, otherwise will just need to urinate d/t diuretic medications. Reminded pt that although he does not need physical assistance to ambulate, he does need physical assistance to unplug IV pole, manage pole and lines as well as managing LVAD lines with potential for switch to battery power.    Therapy Documentation Precautions:  Precautions Precautions: Sternal, Fall, Other (comment) Precaution Comments: LVAD Restrictions Weight Bearing Restrictions: No RUE Weight Bearing:  (sternal precaution) LUE Weight Bearing:  (sternal precaution) RLE Weight Bearing: Weight bearing as tolerated Other Position/Activity Restrictions: Sternal Precautions  Pain: Pain Assessment Pain Score: 0-No pain  Therapy/Group: Individual Therapy  Loel Dubonnet PT, DPT, CSRS 10/11/2022, 2:00 PM

## 2022-10-11 NOTE — Progress Notes (Signed)
Patient ID: Charles Holmes, male   DOB: [birth year], 57 y.o.   MRN: [id number]     Advanced Heart Failure Rounding Note  PCP-Cardiologist: Norman Herrlich, MD  [facility]: Dr. Gala Romney   Subjective:    Admitted to CIR on 09/11  INR 2.4  No complaints, mobility improving.   LVAD Interrogation HM III: Speed: 5400  Flow: 4.1  PI: 4 Power: 4.8  VAD interrogated personally. Parameters stable.  Objective:   Weight Range: 72.6 kg Body mass index is 25.07 kg/m.   Vital Signs:   Temp:  [98.1 F (36.7 C)-99.1 F (37.3 C)] 98.1 F (36.7 C) (09/25 0412) Pulse Rate:  [69-108] 108 (09/25 0412) Resp:  [16-18] 18 (09/25 0412) BP: (88-108)/(65-85) 107/85 (09/25 0800) SpO2:  [96 %-98 %] 98 % (09/25 0412) Weight:  [72.6 kg] 72.6 kg (09/25 0700) Last BM Date : 10/10/22  Weight change: Filed Weights   10/09/22 0452 10/10/22 0451 10/11/22 0700  Weight: 75.9 kg 77.1 kg 72.6 kg   Intake/Output:   Intake/Output Summary (Last 24 hours) at 10/11/2022 1105 Last data filed at 10/11/2022 0856 Gross per 24 hour  Intake 840 ml  Output 1200 ml  Net -360 ml     Physical Exam   General: Well appearing this am. NAD.  HEENT: Normal. Neck: Supple, JVP 7-8 cm. Carotids OK.  Cardiac:  Mechanical heart sounds with LVAD hum present.  Lungs:  CTAB, normal effort.  Abdomen:  NT, ND, no HSM. No bruits or masses. +BS  LVAD exit site: Well-healed and incorporated. Dressing dry and intact. No erythema or drainage. Stabilization device present and accurately applied. Driveline dressing changed daily per sterile technique. Extremities:  Left arm swelling Neuro:  Weak on left side.    Patient Profile  Charles Holmes is a 57 y.o. 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 Admitted with cardiogenic shock, stabilized and underwent HM3 LVAD. Post implant course c/b acute CVA and enterococcus faecalis bacteremia.   Assessment/Plan  1.  Acute on chronic  Systolic HF-->Cardiogenic Shock  - Diagnosed 11/2019. Presented with VT. LHC 70% LAD  - cMRI 12/21 concerning for sarcoid and EF 18%.  - PET 2/22 at [facility] EF 25% + active sarcoid - Medtronic CRT-D upgrade in 06/08/21 - Echo 07/10/22: EF <20%, RV okay, mod pericardial effusion, mod Charles/TR - Admitted 07/25 with cardiogenic shock. - RHC: Nonobstructive CAD, severely elevated filling pressures and low Fick CO/CI (2.7/1.4) - 08/13 HM III LVAD implant + clipping LAA d/t severe thickening and invagination of mitral valve annulus impeding flows.  - Apical core sent - no mention of sarcoid - Echo 8/26 mod-sev RV dysfunction. Speed increased to 5400 8/26 - Ramp Echo 9/4, speed increased to 5400  - MAP 70s. Volume status looks good off lasix.  - On Sildenafil 20 mg tid.  - Continue spironolactone 25mg  daily - Continue digoxin 0.125 mg daily - Can transition to MWF labs  2. HM-3 LVAD - VAD interrogated personally. Parameters stable. - LDH stable - Continue ASA 81 - INR 2.4 decrease warfarin to 3mg  daily as appetite continues to improve   3.  Acute stroke - Hx CVA 06/24 -Admitted 06/24 w/ R MCA stroke. S/p TPA and mechanical clot extraction. No residual deficits. Likely cardioembolic in setting of severe LV dysfunction. - Developed left sided weakness 08/14. CTA with R MCA infarct. Taken to IR for thrombectomy - Repeat CT head with small to moderate size  hemorrhagic conversion.  - repeat head CT on 8/17 w/ improved hemorrhagic CVA - Back on warfarin.  - Functional deficits improving - Continue ASA 81 mg daily - Appreciate CIR  4. Enterococcus faecalis bacteremia - Bcx 2/2 on 9/6 - ID consult 9/6 -> ampicillin and ceftriaxone - TEE recommended but doubt it will change management as VAD cannot be removed and will require IV abx followed by long-term suppression - Echo 09/10 - no obvious vegetations -  Repeat BCx 9/8 NGTD. - ID recommending 6 weeks IV amp/ceftriaxone followed by long-term  suppression with amoxicillin. Stop date 11/04/22   5. Hx VT - ln setting of potential sarcoid heart disease  - Off amio due to tremor. Continue mexiletine, ICD in place   6. CAD - LHC 12/07/19 70-% LAD, no intervention - LHC 8/24 non obstructive CAD.  - Continue statin. On aspirin for VAD. No s/s angina  7. 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   8. 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 Iron deficiency anemia/ Post-op anemia - Has had IV Fe.    10. Pulmonary  - PFTs with severe obstructive defect, response to bronchodilator. FEV1 1.04L, FEV1/FVC 48% - Stable  11. Psych - Depression and delirium post VAD implant. Prolonged hospital course.  - Improving - Continue sertraline  12. Left arm swelling - 9/20 LUE dopplers with chronic superficial vein thrombosis.    [name] 10/11/2022

## 2022-10-11 NOTE — Progress Notes (Signed)
PROGRESS NOTE   Subjective/Complaints:  No issues overnite , some sequencing and awareness issues noted by OT who is doing therapy Fluctuating edema LUE noted by OT , mainly positional Using LUE to grasp LVAD drives  ROS: Patient denies CP, SOB, N/V/D  Objective:   No results found. Recent Labs    10/10/22 0412 10/11/22 0436  WBC 6.7 6.4  HGB 9.3* 9.2*  HCT 32.5* 32.4*  PLT 254 243     Recent Labs    10/10/22 0412 10/11/22 0436  NA 138 139  K 4.0 3.8  CL 104 103  CO2 28 26  GLUCOSE 79 64*  BUN 8 11  CREATININE 0.77 0.58*  CALCIUM 8.5* 8.5*    Intake/Output Summary (Last 24 hours) at 10/11/2022 0855 Last data filed at 10/11/2022 0424 Gross per 24 hour  Intake 600 ml  Output 1200 ml  Net -600 ml        Physical Exam: Vital Signs Blood pressure 108/80, pulse (!) 108, temperature 98.1 F (36.7 C), resp. rate 18, height 5\' 7"  (1.702 m), weight 72.6 kg, SpO2 98%.    General: No acute distress Mood and affect are appropriate Heart: Regular rate and rhythm no rubs murmurs or extra sounds Lungs: Clear to auscultation, breathing unlabored, no rales or wheezes Abdomen: Positive bowel sounds, soft nontender to palpation, nondistended Extremities: No clubbing, cyanosis, or edema Skin: No evidence of breakdown, no evidence of rash  Neuro:  Alert and oriented x 3. Fair insight and awareness.remembers 3/3 objects after delay  Normal language and speech. Cranial nerve exam unremarkable except for mild left central 7. MMT: LUE delt, bi, tri, grip   3/5 prox to distal. LLE 4-/5 throughout. Pt with intact  LT sense on left but feels a little different compared to R side . No abnl resting tone. DTR's tr to1+.  Neuro exam stable 9/24 Musculoskeletal: Full ROM, No pain with AROM or PROM in the neck, trunk, or extremities. Posture appropriate     Assessment/Plan: 1. Functional deficits which require 3+ hours per  day of interdisciplinary therapy in a comprehensive inpatient rehab setting. Physiatrist is providing close team supervision and 24 hour management of active medical problems listed below. Physiatrist and rehab team continue to assess barriers to discharge/monitor patient progress toward functional and medical goals  Care Tool:  Bathing    Body parts bathed by patient: Chest, Left arm, Abdomen, Front perineal area, Face   Body parts bathed by helper: Buttocks, Right upper leg, Left upper leg, Right lower leg, Left lower leg, Right arm     Bathing assist Assist Level: Maximal Assistance - Patient 24 - 49%     Upper Body Dressing/Undressing Upper body dressing   What is the patient wearing?: Pull over shirt    Upper body assist Assist Level: Maximal Assistance - Patient 25 - 49%    Lower Body Dressing/Undressing Lower body dressing      What is the patient wearing?: Pants     Lower body assist Assist for lower body dressing: Minimal Assistance - Patient > 75%     Toileting Toileting    Toileting assist Assist for toileting: Maximal Assistance -  Patient 25 - 49%     Transfers Chair/bed transfer  Transfers assist  Chair/bed transfer activity did not occur: Safety/medical concerns (fatigue)  Chair/bed transfer assist level: Contact Guard/Touching assist     Locomotion Ambulation   Ambulation assist   Ambulation activity did not occur: Safety/medical concerns (fatigue)  Assist level: Contact Guard/Touching assist Assistive device: Walker-rolling Max distance: 150'   Walk 10 feet activity   Assist  Walk 10 feet activity did not occur: Safety/medical concerns (fatigue)  Assist level: Contact Guard/Touching assist Assistive device: Walker-rolling   Walk 50 feet activity   Assist Walk 50 feet with 2 turns activity did not occur: Safety/medical concerns  Assist level: Contact Guard/Touching assist Assistive device: Walker-rolling    Walk 150 feet  activity   Assist Walk 150 feet activity did not occur: Safety/medical concerns  Assist level: Contact Guard/Touching assist Assistive device: Walker-rolling    Walk 10 feet on uneven surface  activity   Assist Walk 10 feet on uneven surfaces activity did not occur: Safety/medical concerns (fatigue)         Wheelchair     Assist Is the patient using a wheelchair?: Yes Type of Wheelchair: Manual Wheelchair activity did not occur: Safety/medical concerns (fatigue)         Wheelchair 50 feet with 2 turns activity    Assist    Wheelchair 50 feet with 2 turns activity did not occur: Safety/medical concerns       Wheelchair 150 feet activity     Assist  Wheelchair 150 feet activity did not occur: Safety/medical concerns       Blood pressure 108/80, pulse (!) 108, temperature 98.1 F (36.7 C), resp. rate 18, height 5\' 7"  (1.702 m), weight 72.6 kg, SpO2 98%.  Medical Problem List and Plan: 1. Functional deficits secondary to acute right MCA infarct/ LVAD pt             -patient may shower             -ELOS/Goals: modI/S 10/17/22   -Continue CIR therapies including PT, OT, and SLP    2.  Antithrombotics: -DVT/anticoagulation:  Pharmaceutical: Coumadin per pharmacy -9/24 INR up to 2.4               -antiplatelet therapy: Aspirin 81 mg daily   3. Pain Management: Tylenol and Tramadol as needed   -LUQ Is due to LVAD driveline--reposition self as possible when needed. 4. Mood/Behavior/Sleep: delirium/insomnia - team to provide ongoing ego support             -continue mirtazapine 7.5 mg q HS             -continue sertraline 50 mg daily             -continue melatonin 3 mg q HS -Started Trazodone 50mg  at bedtime on 09/29/22             -antipsychotic agents: n/a -9/13- mood somewhat better than when saw pt Tuesday- con't regimen and monitor -09/30/22 not sleeping well, increased Melatonin to 5mg  QHS; if not helping, may consider restarting mirtazapine as  above 9/18 poor sleep related to freq urination related to lasix , intake of fluids is minimal may need to use comp hose to prvent accumulation during the day and prevent nocturnal diuresis  -10/07/22 mirtazepine restarted 7.5mg  QHS -- improved sleep   5. Neuropsych/cognition: This patient is capable of making decisions on his own behalf.   6. Skin/Wound Care: Routine skin care checks  7. Fluids/Electrolytes/Nutrition: strict Is and Os and follow-up chemistries             -poor PO intake on Marinol             -continue supplements   -9/12 labs reviewed. low albumin still continue interventions above -9/24 labs stable, daily labs per HF team-->MWF  8: Hypotension but difficult to assess due to LVAD    9/13- BP soft, but denies orthostatic hypotension -9/14-15/24 BPs generally stable/improving; was previously on midodrine, but per HF team pt was stable off it;  Difficult to measure BP due to LVAD as well as LUE edema post CVA -9/24 bp's better off of lasix Vitals:   10/09/22 1443 10/09/22 1604 10/09/22 2013 10/10/22 0021  BP: 92/75 102/72 91/72 (!) 82/63   10/10/22 0434 10/10/22 0736 10/10/22 1204 10/10/22 1454  BP: 97/83 90/76 90/76  (!) 89/65   10/10/22 1642 10/10/22 1932 10/11/22 0010 10/11/22 0412  BP: (!) 88/76 102/77 107/83 108/80   Systolic mainly in 100s now which is better for CVD  9: Hyperlipidemia: continue atorvastatin 80mg  daily   10: Enterococcus bacteremia: 6 weeks IV antibiotic, ceftriaxone and ampicillin last dose 10/19 will go home with PICC and HH             -follow-up with ID  -09/30/22 PICC placed   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: Home meds: Coreg 3.125, Lasix 40mg , Lostartan 25mg , Aldactone 12.5, Farxiga 10mg , mexilitene --holding all currently   12: Acute on chronic systolic heart failure with cardiogenic shock s/p LVAD             -daily weights balanced so far             -continue digoxin 0.125 mg daily             -continue mexiletine 250 mg BID -continue  midodrine 5 mg q 8 hours--not ordered, per HF team on 9/13 pt was stable off Midodrine; hold for now             -continue sildenafil 20 mg q 8 hours             -continue warfarin per pharmacy Managed by Heart Failure team - coordinating with primary service for activity tolerance monitoring  -10/11/22 wt down today ?technique Filed Weights   10/09/22 0452 10/10/22 0451 10/11/22 0700  Weight: 75.9 kg 77.1 kg 72.6 kg    13: Hx of VT: continue mexiletine, digoxin; has ICD   14: DM-2: A1c = 5.5%; monitor PO intake; DC SSI (Farxiga at home) .cbg  15: OSA: needs outpatient sleep study   16: Anemia, iron deficiency: follow-up CBC--most recent hgb 9.5--lab pending still today?  -10/11/22 Hgb stable 9.3, monitor    Latest Ref Rng & Units 10/11/2022    4:36 AM 10/10/2022    4:12 AM 10/09/2022    3:36 AM  CBC  WBC 4.0 - 10.5 K/uL 6.4  6.7  6.7   Hemoglobin 13.0 - 17.0 g/dL 9.2  9.3  9.2   Hematocrit 39.0 - 52.0 % 32.4  32.5  31.6   Platelets 150 - 400 K/uL 243  254  253       17: Severe COPD:              -continue Ellipta one puff daily   18: S/p tooth extractions this admission: monitor PO tolerance   19: Glaucoma: continue Cosopt and Xalatan   20.  Candidiasis in groin start nystatin powder   21.  Dysuria- UA neg , on broad spectrum IV abx for bacteremia-- resolved 10/07/22  22.  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   - pt on warfarin which is now therapeutic.On pharmacy protocol   LOS: 14 days A FACE TO FACE EVALUATION WAS PERFORMED  Erick Colace 10/11/2022, 8:55 AM

## 2022-10-11 NOTE — Progress Notes (Signed)
  Subjective: Feeling stronger and encouraged with his progress especially with strength and dexterity of LUE  Objective: Vital signs in last 24 hours: Temp:  [98.1 F (36.7 C)-99.1 F (37.3 C)] 98.1 F (36.7 C) (09/25 0412) Pulse Rate:  [69-108] 108 (09/25 0412) Resp:  [16-18] 18 (09/25 0412) BP: (88-108)/(65-85) 107/85 (09/25 0800) SpO2:  [96 %-98 %] 98 % (09/25 0412) Weight:  [72.6 kg] 72.6 kg (09/25 0700)  Hemodynamic parameters for last 24 hours:  nsr Afebrile   Intake/Output from previous day: 09/24 0701 - 09/25 0700 In: 720 [P.O.:720] Out: 1200 [Urine:1200] Intake/Output this shift: Total I/O In: 240 [P.O.:240] Out: -   EXAM  Alert and appropriate Nsr Normal VAD hum Lungs clear Chest incisions healing LUE edema better Lab Results: Recent Labs    10/10/22 0412 10/11/22 0436  WBC 6.7 6.4  HGB 9.3* 9.2*  HCT 32.5* 32.4*  PLT 254 243   BMET:  Recent Labs    10/10/22 0412 10/11/22 0436  NA 138 139  K 4.0 3.8  CL 104 103  CO2 28 26  GLUCOSE 79 64*  BUN 8 11  CREATININE 0.77 0.58*  CALCIUM 8.5* 8.5*    PT/INR:  Recent Labs    10/11/22 0436  LABPROT 25.6*  INR 2.3*   ABG    Component Value Date/Time   PHART 7.365 08/31/2022 1504   HCO3 33.2 (H) 09/12/2022 1108   HCO3 31.2 (H) 09/12/2022 1108   TCO2 35 (H) 09/12/2022 1108   TCO2 33 (H) 09/12/2022 1108   ACIDBASEDEF 4.0 (H) 08/31/2022 1504   O2SAT 66.9 09/16/2022 0539   CBG (last 3)  No results for input(s): "GLUCAP" in the last 72 hours.  Assessment/Plan: S/P  Cont rehab care and discharge VAD care teaching He is making great progress  LOS: 14 days    Lovett Sox 10/11/2022

## 2022-10-11 NOTE — Progress Notes (Signed)
Patient ID: Charles Holmes, male   DOB: 06/17/1965, 57 y.o.   MRN: [id number]  Team Conference Report to Patient/Family  Team Conference discussion was reviewed with the patient and caregiver, including goals, any changes in plan of care and target discharge date.  Patient and caregiver express understanding and are in agreement.  The patient has a target discharge date of 10/17/22.   SW met with patient at bedside. Sw informed pt of discharge date.   Sw informed patient that SW will make attempt to arrange family education with cousin, Charles Holmes.  Sw made attempt x2 to reach cousin, Charles Holmes. Sw unable to leave VM due to VM box not being set up. Sw will continue to FU.  Andria Rhein 10/11/2022, 2:31 PM

## 2022-10-11 NOTE — Progress Notes (Signed)
LVAD Coordinator Rounding Note:  Admitted 08/10/22 due to acute on chronic CHF with cardiogenic shock. Milrinone dependent. Advance therapy workup completed, and pt deemed acceptable VAD candidate. Dental extractions completed 8/6. IABP placed 08/25/22.  HM 3 LVAD implanted on 08/29/22 by Dr Donata Clay under destination therapy criteria. 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 Result negative.  7/25 Admit with cardiogenic shock. Started milrinone and NE. 8/6 S/P 13 teeth extractions  8/9 IABP placed 8/13 S/p HM III LVAD implant + clipping left atrial appendage d/t severe thickening and invagination of mitral valve annulus impeding flows  CT Head 8/14 (initial) 1. 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 ASPECTS is 7. 2. No acute hemorrhage.  CT Angio Head/Neck 1. 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 2. Core infarct of 12 cc (somewhat underestimated compared to aspects) with 90 cc of penumbra. 3. Mild atherosclerosis.  Pt taken emergently to IR for percutaneous right common carotid arteriogram with thrombectomy. Revascularization achieved. Angio-seal closure device applied to left groin- clean, dry, and intact.   CT Head 8/15 @ 0450 Unchanged extent of infarct and hemorrhage in the right MCA distribution including small volume intraventricular clot. No hydrocephalus.  CT Head 8/17 @ 0701 Interval evolution of the right MCA territory infarct with decreased intraparenchymal and intraventricular hemorrhage. No hydrocephalus or midline shift.   Met with pt at bedside. Currently working with OT. Pt walked from bathroom to sink to brush his teeth with standby assistance. Per therapist pt needs verbal cues to assist with equipment management. States he feels his grip strength is improving.   Pt tentative d/c date is October 1. VAD home equipment ordered today.   Blood cultures from 9/6 positive for Enterococcus Faecalis.  Subsequent blood cultures NGTD. ID following recommending 6 weeks of IV Ampicillin and Ceftriaxone followed by chronic suppression with Amoxicillin.   VAD coordinator/nurses working with pt on changing power sources. He is unable to do this independently due to his left sided weakness. Modification of his power cords was made in hopes he can grip the cords better. Has practice power cords for use by pt. See below for education documentation. He is making progress with therapy.  Pt may not shower at this time.   Vital signs: Temp: 98.1 HR: 71 Doppler Pressure: 88 Automatic BP: 107/85 (90) O2 Sat: 98% on RA Wt: 183.6>191.1>190.9>190.2>184.8>...>163.1>162.5>171.3>172.4>176.6> 180.6>175.1>174.8>176.3>175.7>177.5>173>171.5>167.3>169.9>160.1 lbs   LVAD interrogation reveals:  Speed: 5400 Flow: 4.6 Power: 4.1 w PI: 4.0  Alarms: none Events: 4 PI events so far today  Hematocrit: 32 Fixed speed: 5400 Low speed limit: 5100  Drive Line: Existing VAD dressing clean, dry, and intact. Drive line anchor correctly applied. Continue twice a week dressing changes on Monday/Thursday. Next dressing change due 10/12/22 by VAD coordinator, nurse champion, or [place].   Labs:  LDH trend: 596>441>165>384>345>312>312>350>327>309>316>305>298>277>273>298>280>285>251>252>205  INR trend: 1.4>1.6>1.1>1.1>1.3>1.2>1>1.3>1.4>1.5>1.6>2.8>2.8>2.3>2.2>2.3>2.6>2.6>1.7>2>1.8>1.7>2.1>1.8>2.4>2.3  AST/ALT trend: 218/45>96/30>73/23>63/36>58/37>69/50>93/72>123/103>143/150>117/138>119/138>92/127>87/123>50/71>46/65>46/43  Total Bili trend: 4.4>4.6>5.1>1.8>1.9>1.2>1.0>1.5>1.1>0.8>0.9>1.0>0.8  WBC trend: 11.6>13.7>19.6>16.1>23>26.6>21>24.6>18.1>16.5>13.2>13.1>12.5>11.6>10.5>10.8>13.1>9.5>9.1>9.7>7.8>6.7>6.7>6.4  Anticoagulation Plan: -INR Goal: 2.0 - 2.5 -ASA Dose: 81 mg  - Coumadin dosing per pharmacy  Blood Products:  IntraOp 8/14: - 4 FFP - 2 Platelets - 2 PRBC - 1 cyro - 449 cc of cellsaver - DDAVP 20 mcg x  1   Device: Medtronic BiV -Therapies: ON VF ON >207 BPM FVT ON 176-207 BPM VT ON 171-207 BPM  Arrythmias:   Respiratory:   Infection:  09/01/22>> sputum cx>> NGTD>>final 09/01/22>> blood cx>> NGTD>>final 09/01/22>> urine cx>> NGTD>> final 09/13/22>>blood cxs>>no  growth 5 days; final 09/22/22>>blood cxs>> Enterococcus faecalis  09/23/22>> blood cxs>> neg final 09/24/22>> blood cxs>> neg final  Renal:  9/23:7/0.71  Adverse Events on VAD: - 08/30/22: - Developed left sided weakness this am. CTA with R MCA infarct. Taken to IR for thrombectomy   Patient Education: Discharge teaching completed with pt's [name] on 9/5. See separate note for documentation.   Plan/Recommendations:  Please page VAD coordinator for any alarms or VAD equipment issues. Continue twice a week dressing changes on Monday/Thursday by VAD coordinator, Nurse Alla Feeling, or [name].  [name] RN VAD Coordinator  Office: [phone]  24/7 Pager: [phone]

## 2022-10-11 NOTE — Progress Notes (Signed)
Occupational Therapy Session Note  Patient Details  Name: Charles Holmes MRN: [id number] Date of Birth: [birth year]  Today's Date: 10/11/2022 OT Individual Time: 0830-1000 OT Individual Time Calculation (min): 90 min    Short Term Goals: Week 2:  OT Short Term Goal 1 (Week 2): Pt will demonstrate improved L FMC to fasten and unfasten LVAD connectors in less than 1 minute. OT Short Term Goal 2 (Week 2): Pt will demonstrate improved L hand function to grasp pants to pull over L side of hips with min A. OT Short Term Goal 3 (Week 2): Pt will demonstrate improved memory to recall steps of LVAD battery change with min cues. OT Short Term Goal 4 (Week 2): Pt will complete BSC or toilet transfers with min A.  Skilled Therapeutic Interventions/Progress Updates:    Pt received in bed finishing breakfast. He was agreeable to moving to EOB to finish eating. Pt disconnected from PICC line so we were able to have pt be more involved in UB self care and mobility. See ADL documentation below. Pt did bathe and dress from EOB using L hand more actively with lifting arm but continues to have difficulty fully grasping clothing. Also continues to need cues with hemiplegic dressing strategies.   Sit to stands from EOB initially pt could not get up and not able to recall how he needs to position himself. Demonstrated to pt how to position hips and feet and shift weight forward and then he was able to rise to stand with supervision from low bed and light use of RUE.  Pt continues to have sternal precautions until the end of the month but has great difficulty not using R arm with A to sit to stand.   Pt then ambulated to toilet with no AD or assist but with cues to hold the cords of his LVAD to avoid any tugging from insertion site.  He continues to need cues to be aware of where cords are to avoid stepping on them. Pt completed toileting and void (documented in flow sheet) and able to self cleanse. Min A to fully pull  pants over L side of hips.  Pt then ambulated to sink and able to stand for up to 6 minutes while he spoke with LVAD RN, completed oral care/grooming at sink.    Pt sat in wc and therapist donned his TED hose and shoes.    Pt then engaged in LUE NMR exercises. He can now lift arm above shoulder height.   Used L hand as a stabilizing A to disconnect power cords from LVAD room unit but needed hand over hand A to keep L hand on battery to hold it as he used R hand to fasten cord into battery.  Cue needed to ensure he clicked cord to battery before trying to turn fastener.  Mod A to place batteries into holster and then put on his back.   Continued exercises for pinch and grip strength. Pt provided with firmer foam block to use.      Resting in wc with seat alarm on and all needs met.  LUE supported by pillows.   Therapy Documentation Precautions:  Precautions Precautions: Sternal, Fall, Other (comment) Precaution Comments: LVAD Restrictions Weight Bearing Restrictions: No RUE Weight Bearing:  (sternal precaution) LUE Weight Bearing:  (sternal precaution) RLE Weight Bearing: Weight bearing as tolerated Other Position/Activity Restrictions: Sternal Precautions   Vital Signs: Therapy Vitals BP: 107/85 Pain: Pain Assessment Pain Score: 0-No pain ADL: ADL Eating: Set  up Where Assessed-Eating: Edge of bed Grooming: Setup Where Assessed-Grooming: Standing at sink Upper Body Bathing: Minimal assistance Where Assessed-Upper Body Bathing: Edge of bed Lower Body Bathing: Moderate assistance Where Assessed-Lower Body Bathing: Edge of bed Upper Body Dressing: Moderate assistance Where Assessed-Upper Body Dressing: Edge of bed Lower Body Dressing: Moderate assistance Where Assessed-Lower Body Dressing: Edge of bed Toileting: Minimal assistance Where Assessed-Toileting: Teacher, adult education: Close supervision Toilet Transfer Method: Proofreader: Editor, commissioning: Not assessed Film/video editor: Not assessed   Therapy/Group: Individual Therapy  Laken Rog 10/11/2022, 11:44 AM

## 2022-10-11 NOTE — Group Note (Signed)
Patient Details Name: Charles Holmes MRN: [id number] DOB: [birth year] Today's Date: 10/11/2022  Time Calculation: OT Group Time Calculation OT Group Start Time: 1400 OT Group Stop Time: 1500 OT Group Time Calculation (min): 60 min      Group Description: Fine motor: Pt participated in group session with a focus on LuE FMC, functional reach with LUE, problem solving, visual attention, and social interaction. Pt actively participated in group game of "uno"  where pts were instructed on rules of game. Pt using LUE to engage in activity.Pt utilized below compensatory methods to engage in task hand over hand assist, increased proixmal support, L wrist brace, and increased time.   Pain: Pain Assessment Pain Score: 0-No pain     Barron Schmid 10/11/2022, 3:26 PM

## 2022-10-12 LAB — LACTATE DEHYDROGENASE: LDH: 193 U/L — ABNORMAL HIGH (ref 98–192)

## 2022-10-12 LAB — MAGNESIUM: Magnesium: 1.6 mg/dL — ABNORMAL LOW (ref 1.7–2.4)

## 2022-10-12 LAB — PROTIME-INR
INR: 2 — ABNORMAL HIGH (ref 0.8–1.2)
Prothrombin Time: 22.6 seconds — ABNORMAL HIGH (ref 11.4–15.2)

## 2022-10-12 MED ORDER — MIRTAZAPINE 15 MG PO TABS: 7.5000 mg | ORAL_TABLET | Freq: Every evening | ORAL | Status: DC | PRN

## 2022-10-12 MED ORDER — FUROSEMIDE 20 MG PO TABS
20.0000 mg | ORAL_TABLET | Freq: Once | ORAL | Status: AC
  Administered 2022-10-12: 20 mg via ORAL
  Filled 2022-10-12: qty 1

## 2022-10-12 MED ORDER — TRAZODONE HCL 50 MG PO TABS: 50.0000 mg | ORAL_TABLET | Freq: Every evening | ORAL | Status: DC | PRN

## 2022-10-12 MED ORDER — MAGNESIUM SULFATE 4 GM/100ML IV SOLN
4.0000 g | Freq: Once | INTRAVENOUS | Status: AC
  Administered 2022-10-12: 4 g via INTRAVENOUS
  Filled 2022-10-12: qty 100

## 2022-10-12 MED ORDER — WARFARIN SODIUM 4 MG PO TABS
4.0000 mg | ORAL_TABLET | Freq: Once | ORAL | Status: AC
  Administered 2022-10-12: 4 mg via ORAL
  Filled 2022-10-12: qty 1

## 2022-10-12 MED ORDER — MELATONIN 5 MG PO TABS: 5.0000 mg | ORAL_TABLET | Freq: Every evening | ORAL | Status: DC | PRN

## 2022-10-12 NOTE — Progress Notes (Signed)
Occupational Therapy Session Note  Patient Details  Name: Charles Holmes MRN: [id number] Date of Birth: 11/11/1965  Today's Date: 10/12/2022 OT Individual Time: 4098-1191 OT Individual Time Calculation (min): 75 min    Short Term Goals: Week 2:  OT Short Term Goal 1 (Week 2): Pt will demonstrate improved L FMC to fasten and unfasten LVAD connectors in less than 1 minute. OT Short Term Goal 2 (Week 2): Pt will demonstrate improved L hand function to grasp pants to pull over L side of hips with min A. OT Short Term Goal 3 (Week 2): Pt will demonstrate improved memory to recall steps of LVAD battery change with min cues. OT Short Term Goal 4 (Week 2): Pt will complete BSC or toilet transfers with min A.  Skilled Therapeutic Interventions/Progress Updates:  Pt greeted supine in bed, pt agreeable to OT intervention.      Transfers/bed mobility: pt completed supine>sit with CGA. Pt completed all functional mobility while holding onto IV pole with RUE with CGA. Pt able to ambulate > 100 ft back and forth from room.   Therapeutic activity:  Pt engaged in sit>stand therapeutic activity where pt was instructed to Sit>stand from EOM with an emphasis on LB strength and maintaining sternal precautions with pt instructed to use LUE to flip over blocks from table once standing to find 2 matches to facilitate improved LUE coordination and simulate manipulating LVAD wires. Pt completed sit>stands with CGA, + time needed for LUE coordination tasks but able to complete [facility] task with overall supervision assist.   Pt additionally completed Functional mobility task where pt was instructed to grasp small colored block via lateral pinch grasp, walk ~ 10 ft to table to place block on matching colored dot with an emphasis on increasing overall activity tolerance and facilitating improved LUE FMC. Pt completed task with CGA with no AD. Pt completed a total A of 50 ft of functional mobility with no rest breaks needed.    Pt completed seated FMC task with LUE with pt instructed to doff Nuts/bolts in sitting to facilitate LVAD wires. Pt completed task with + time and supervision.    ADLs:  LB bathing- pt able to wash LB in standing with RUE and CGA d/t urinal falling over in bed LB dressing: pt donned new pants from EOB with MODA overall, needed assist to thread pants d/t gripper socks.    Pt was able to switch LVAD over to batteries with + time and light MIN A with assist needed to fully press "half moons" together.   Toileting: pt opted to stand to use urinal d/t urgency with set- up assist of urinal. Continent urine void.                 Ended session with pt supine in bed with all needs within reach and bed alarm activated.                    Therapy Documentation Precautions:  Precautions Precautions: Sternal, Fall, Other (comment) Precaution Comments: LVAD Restrictions Weight Bearing Restrictions: No RUE Weight Bearing:  (sternal precaution) LUE Weight Bearing:  (sternal precaution) RLE Weight Bearing: Weight bearing as tolerated Other Position/Activity Restrictions: Sternal Precautions  Pain: No pain    Therapy/Group: Individual Therapy  Barron Schmid 10/12/2022, 3:11 PM

## 2022-10-12 NOTE — Progress Notes (Addendum)
ANTICOAGULATION CONSULT NOTE - Follow-up  Pharmacy Consult for warfarin Indication:  LVAD  Allergies  Allergen Reactions   Pacerone [Amiodarone] Other (See Comments)    Severe tremors   Percocet [Oxycodone-Acetaminophen] Itching    Patient Measurements: Height: 5\' 7"  (170.2 cm) Weight: 72.6 kg (160 lb 0.9 oz) IBW/kg (Calculated) : 66.1 Heparin Dosing Weight: 87kg  Vital Signs: Temp: 97.2 F (36.2 C) (09/26 1108) Temp Source: Oral (09/26 0500) BP: 105/76 (09/26 1108) Pulse Rate: 71 (09/26 1108)  Labs: Recent Labs    10/10/22 0412 10/11/22 0436 10/12/22 0305  HGB 9.3* 9.2*  --   HCT 32.5* 32.4*  --   PLT 254 243  --   LABPROT 26.6* 25.6* 22.6*  INR 2.4* 2.3* 2.0*  CREATININE 0.77 0.58*  --     Estimated Creatinine Clearance: 93 mL/min (A) (by C-G formula based on SCr of 0.58 mg/dL (L)).   Medical History: Past Medical History:  Diagnosis Date   CAD (coronary artery disease)    CHF (congestive heart failure) (HCC)    COPD (chronic obstructive pulmonary disease) (HCC)    GERD (gastroesophageal reflux disease)    Hyperlipidemia    Hypertension    LVAD (left ventricular assist device) present (HCC)    Stroke (HCC)    Systolic heart failure (HCC) 2021   LVEF 18%, RVEF 38% on cardiac MRI 12/19/2019. possible cardiac sarcoidosis.   Wide-complex tachycardia 2021   wears LifeVest     Assessment: 57 yoM on apixaban PTA for hx AF admitted for LVAD workup. Pt s/p HM3 implant on 8/13 c/b acute CVA postop, on aspirin 81 mg daily. Started low dose heparin + warfarin started 8/19. Low-dose heparin was stopped 8/31.  INR therapeutic at 2.0 (-0.3). Patient did not receive his dose of warfarin last night. No signs or symptoms of bleeding. CBC and LDH stable. 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  Goal of Therapy:  INR 2-2.5 Monitor platelets by anticoagulation protocol: Yes   Plan:  Warfarin 4mg  x1 Monitor daily INR, CBC   Wilmer Floor,  PharmD PGY2 Cardiology Pharmacy Resident Please check AMION for all [facility] Pharmacy numbers 10/12/2022

## 2022-10-12 NOTE — Progress Notes (Signed)
Speech Language Pathology Weekly Progress and Session Note  Patient Details  Name: Charles Holmes MRN: [id number] Date of Birth: 10/02/1965  Beginning of progress report period: October 05, 2022 End of progress report period: October 12, 2022  Today's Date: 10/12/2022 SLP Individual Time: 1435-1500 SLP Individual Time Calculation (min): 25 min  Short Term Goals: Week 2: SLP Short Term Goal 1 (Week 2): Patient will attend to left field of enviornment during functional tasks with Mod I SLP Short Term Goal 1 - Progress (Week 2): Met SLP Short Term Goal 2 (Week 2): Patient will utilize speech intelligibility strategies at the sentence level to achieve ~80% intelligibility with Min verbal cues. SLP Short Term Goal 2 - Progress (Week 2): Met SLP Short Term Goal 3 (Week 2): Patient will recall reasoning for specific LVAD alarms with Min verbal and visual cues. SLP Short Term Goal 3 - Progress (Week 2): Met SLP Short Term Goal 4 (Week 2): Patient will recall sequence to switching to LVAD batteries with Min verbal and visual cues. SLP Short Term Goal 4 - Progress (Week 2): Met    New Short Term Goals: Week 3: SLP Short Term Goal 1 (Week 3): STGs=LTGs due to ELOS  Weekly Progress Updates: Patient has made functional gains and has met 4 of 4 STGs this reporting period. Currently, patient requires overall Min A verbal cues for recall of functional information, sequencing, and problem solving as it relates to his LVAD. Patient also requires overall Min A verbal cues for use of speech intelligibility strategies at the sentence level to achieve 80-90% intelligibility. Patient and family education ongoing. Patient would benefit from continued skilled SLP intervention to maximize his cognitive functioning and speech intelligibility prior to discharge.      Intensity: Minumum of 1-2 x/day, 30 to 90 minutes Frequency: 1 to 3 out of 7 days Duration/Length of Stay: 10/1 Treatment/Interventions:  Cognitive remediation/compensation;Cueing hierarchy;Environmental controls;Functional tasks;Internal/external aids;Patient/family education;Speech/Language facilitation   Daily Session  Skilled Therapeutic Interventions:  Skilled treatment session focused on communication goals. SLP facilitated session with a structured verbal description task. Patient verbalized at the sentence level and was 100% intelligible at the sentence level. Patient also recalled events from previous therapy sessions with Mod I. Patient left upright in bed with alarm on and all needs within reach. Continue with current plan of care.    Pain No/Denies Pain   Therapy/Group: Individual Therapy  Hayes Czaja 10/12/2022, 6:42 AM

## 2022-10-12 NOTE — Progress Notes (Addendum)
Patient ID: Charles Holmes, male   DOB: [birth year], 57 y.o.   MRN: [id number]     Advanced Heart Failure Rounding Note  PCP-Cardiologist: Norman Herrlich, MD  [facility]: Dr. Gala Romney   Subjective:    Admitted to CIR on 09/11  INR 2  Feels stronger, excited about possibly going home next Tuesday.   LVAD Interrogation HM III: Speed: 5400  Flow: 4.6 PI: 2.7 Power: 4  VAD interrogated personally. Parameters stable. x6 PI events   aspirin EC  81 mg Oral Daily   atorvastatin  80 mg Oral Daily   Chlorhexidine Gluconate Cloth  6 each Topical BID   digoxin  0.125 mg Oral Daily   dorzolamide-timolol  1 drop Both Eyes BID   Gerhardt's butt cream   Topical TID   influenza vac split trivalent PF  0.5 mL Intramuscular Tomorrow-1000   latanoprost  1 drop Both Eyes QHS   mexiletine  250 mg Oral BID   mometasone-formoterol  2 puff Inhalation BID   multivitamin with minerals  1 tablet Oral Daily   nystatin   Topical BID   pantoprazole  40 mg Oral Daily   sertraline  50 mg Oral Daily   sildenafil  20 mg Oral TID   spironolactone  25 mg Oral Daily   umeclidinium bromide  1 puff Inhalation Daily   warfarin  4 mg Oral ONCE-1600   Warfarin - Pharmacist Dosing Inpatient   Does not apply q1600    Objective:   Weight Range: 72.6 kg Body mass index is 25.07 kg/m.   Vital Signs:   Temp:  [98.3 F (36.8 C)-98.4 F (36.9 C)] 98.4 F (36.9 C) (09/26 0500) Pulse Rate:  [70-86] 74 (09/26 0500) Resp:  [16] 16 (09/25 1543) BP: (92-110)/(60-92) 104/92 (09/26 0000) SpO2:  [94 %-98 %] 97 % (09/26 0500) Last BM Date : 10/10/22  Weight change: Filed Weights   10/09/22 0452 10/10/22 0451 10/11/22 0700  Weight: 75.9 kg 77.1 kg 72.6 kg   Intake/Output:   Intake/Output Summary (Last 24 hours) at 10/12/2022 1053 Last data filed at 10/12/2022 0731 Gross per 24 hour  Intake 2223.15 ml  Output 900 ml  Net 1323.15 ml     Physical Exam  General:  Well appearing. No resp difficulty HEENT: Normal Neck:  supple. JVP ~8. Carotids 2+ bilat; no bruits. No lymphadenopathy or thyromegaly appreciated. Cor: Mechanical heart sounds with LVAD hum present. Lungs: Clear Abdomen: soft, nontender, nondistended. No hepatosplenomegaly. No bruits or masses. Good bowel sounds. Driveline: C/D/I; securement device intact and driveline incorporated Extremities: no cyanosis, clubbing, rash, trace BLE edema. PICC RUE Neuro: alert & orientedx3, cranial nerves grossly intact. L arm weaker than R. Affect pleasant   Patient Profile  Charles Holmes is a 57 y.o. 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 Admitted with cardiogenic shock, stabilized and underwent HM3 LVAD. Post implant course c/b acute CVA and enterococcus faecalis bacteremia.   Assessment/Plan  1.  Acute on chronic Systolic HF-->Cardiogenic Shock  - Diagnosed 11/2019. Presented with VT. LHC 70% LAD  - cMRI 12/21 concerning for sarcoid and EF 18%.  - PET 2/22 at [facility] EF 25% + active sarcoid - [employer] CRT-D upgrade in 06/08/21 - Echo 07/10/22: EF <20%, RV okay, mod pericardial effusion, mod Charles/TR - Admitted 07/25 with cardiogenic shock. - RHC: Nonobstructive CAD, severely elevated filling pressures and low Fick CO/CI (2.7/1.4) - 08/13 HM III LVAD implant + clipping  LAA d/t severe thickening and invagination of mitral valve annulus impeding flows.  - Apical core sent - no mention of sarcoid - Echo 8/26 mod-sev RV dysfunction. Speed increased to 5400 8/26 - Ramp Echo 9/4, speed increased to 5400  - MAP 80s-90s. Volume stable. Will give 20 PO lasix today. Has been using PRN.  - On Sildenafil 20 mg tid.  - Continue spironolactone 25mg  daily - Continue digoxin 0.125 mg daily - Now on MWF labs  2. HM-3 LVAD - VAD interrogated personally. Parameters stable. - LDH stable - Continue ASA 81 - INR 2. Dosing per PharmD  3.  Acute stroke - Hx CVA 06/24 -Admitted 06/24 w/ R MCA stroke. S/p TPA  and mechanical clot extraction. No residual deficits. Likely cardioembolic in setting of severe LV dysfunction. - Developed left sided weakness 08/14. CTA with R MCA infarct. Taken to IR for thrombectomy - Repeat CT head with small to moderate size hemorrhagic conversion.  - repeat head CT on 8/17 w/ improved hemorrhagic CVA - Back on warfarin.  - Functional deficits improving - Continue ASA 81 mg daily - Appreciate CIR  4. Enterococcus faecalis bacteremia - Bcx 2/2 on 9/6 - ID consult 9/6 -> ampicillin and ceftriaxone - TEE recommended but doubt it will change management as VAD cannot be removed and will require IV abx followed by long-term suppression - Echo 09/10 - no obvious vegetations -  Repeat BCx 9/8 NGTD. - ID recommending 6 weeks IV amp/ceftriaxone followed by long-term suppression with amoxicillin. Stop date 11/04/22   5. Hx VT - ln setting of potential sarcoid heart disease  - Off amio due to tremor. Continue mexiletine, ICD in place   6. CAD - LHC 12/07/19 70-% LAD, no intervention - LHC 8/24 non obstructive CAD.  - Continue statin. On aspirin for VAD. No s/s angina  7. 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   8. 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 Iron deficiency anemia/ Post-op anemia - Has had IV Fe.    10. Pulmonary  - PFTs with severe obstructive defect, response to bronchodilator. FEV1 1.04L, FEV1/FVC 48% - Stable  11. Psych - Depression and delirium post VAD implant. Prolonged hospital course.  - Improving - Continue sertraline  12. Left arm swelling - 9/20 LUE dopplers with chronic superficial vein thrombosis.   Tentative home 10/1.   Alen Bleacher AGACNP-BC  10/12/2022  Patient seen and examined with the above-signed  Advanced Practice Provider and/or Housestaff. I personally reviewed laboratory data, imaging studies and relevant notes. I independently examined the patient and formulated the important aspects of the plan. I have edited the note to reflect any of my changes or salient points. I have personally discussed the plan with the patient and/or family.  Feels ok. Tired at times. Working with rehab.  General:  Lying in bed. NAD.  HEENT: normal  Neck: supple. JVP not elevated.  Carotids 2+ bilat; no bruits. No lymphadenopathy or thryomegaly appreciated. Cor: LVAD hum.  Lungs: Clear. Abdomen: soft, nontender, non-distended. No hepatosplenomegaly. No bruits or masses. Good bowel sounds. Driveline site clean. Anchor in place.  Extremities: no cyanosis, clubbing, rash. Warm no edema  Neuro: alert & oriented x 3. LUE weak  Continues to make progress. Stable from VAD/HF perspective. INR 2.0  Discussed dosing with PharmD personally.  MAPs slightly elevated. Will continue to follow. Can add losartan as needed  Arvilla Meres, MD  3:02 PM

## 2022-10-12 NOTE — Progress Notes (Signed)
Occupational Therapy Session Note  Patient Details  Name: KHARSON FRANZEL MRN: [id number] Date of Birth: 1965/09/09  Today's Date: 10/12/2022 OT Individual Time: 2536-6440 OT Individual Time Calculation (min): 65 min    Short Term Goals: Week 2:  OT Short Term Goal 1 (Week 2): Pt will demonstrate improved L FMC to fasten and unfasten LVAD connectors in less than 1 minute. OT Short Term Goal 2 (Week 2): Pt will demonstrate improved L hand function to grasp pants to pull over L side of hips with min A. OT Short Term Goal 3 (Week 2): Pt will demonstrate improved memory to recall steps of LVAD battery change with min cues. OT Short Term Goal 4 (Week 2): Pt will complete BSC or toilet transfers with min A.  Skilled Therapeutic Interventions/Progress Updates:    Pt received laying in bed.   While I donned his knee high TEDS in bed, had pt do motor imagery task with LUE of visualizing he was taking a drink of sweet tea moving elbow into flex and ext.   Pt moved with CGA to EOB.   Donned pants min A. Light CGA with sit to stand continues to need cues with technique.     Max encouragement to get to EOB to do arm exercises. C/o buttock pain in sitting, declined sitting in wc or recliner.  Placed egg crates on bed for pt to sit on.      Standing sh flexion to 70 degrees AROM, sh abduction to 80 degrees. Pt has been making excellent progress with his LUE functional movement.   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    Pt worked on various ArOM exercises and pinch/grasp strength by pulling pegs out of a foam board and picking up squigge blocks.  Pt participated well but does need encouragement.    During treatment, wrapped hand with coban for edema control.. removed at end of treatment.  Pt moved back to supine.  Placed a wedge cushion under L forearm  to keep hand elevated and reminded pt to work on finger AROM to prevent edema.  Bed alarm set and all needs met.    Therapy Documentation Precautions:  Precautions Precautions: Sternal, Fall, Other (comment) Precaution Comments: LVAD Restrictions Weight Bearing Restrictions: No RUE Weight Bearing:  (sternal precaution) LUE Weight Bearing:  (sternal precaution) RLE Weight Bearing: Weight bearing as tolerated Other Position/Activity Restrictions: Sternal Precautions    Pain:   ADL: ADL Eating: Set up Where Assessed-Eating: Edge of bed Grooming: Setup Where Assessed-Grooming: Standing at sink Upper Body Bathing: Minimal assistance Where Assessed-Upper Body Bathing: Edge of bed Lower Body Bathing: Moderate assistance Where Assessed-Lower Body Bathing: Edge of bed Upper Body Dressing: Moderate assistance Where Assessed-Upper Body Dressing: Edge of bed Lower Body Dressing: Moderate assistance Where Assessed-Lower Body Dressing: Edge of bed Toileting: Minimal assistance Where Assessed-Toileting: Teacher, adult education: Close supervision Toilet Transfer Method: Proofreader: Engineer, technical sales: Not assessed Film/video editor: Not assessed      Therapy/Group: Individual Therapy  Pattye Meda 10/12/2022, 10:24 AM

## 2022-10-12 NOTE — Progress Notes (Signed)
Patient ID: Charles Holmes, male   DOB: 29-May-1965, 57 y.o.   MRN: [id number]  Bedside Commode ordered through adapt.

## 2022-10-12 NOTE — Patient Care Conference (Signed)
Inpatient RehabilitationTeam Conference and Plan of Care Update Date: 10/11/2022   Time: 10:17 AM    Patient Name: Charles Holmes      Medical Record Number: [id number]  Date of Birth: 1965-12-20 Sex: Male         Room/Bed: 4W12C/4W12C-02 Payor Info: Payor: [employer] MEDICARE / Plan: [facility] [employer] MEDICARE / Product Type: *No Product type* /    Admit Date/Time:  09/27/2022  4:00 PM  Primary Diagnosis:  Acute right MCA stroke [facility])  Hospital Problems: Principal Problem:   Acute right MCA stroke (HCC) Active Problems:   LVAD (left ventricular assist device) present (HCC)   Hypotension due to drugs   Acute blood loss anemia    Expected Discharge Date: Expected Discharge Date: 10/17/22  Team Members Present: Physician leading conference: Dr. Claudette Laws Social Worker Present: Lavera Guise, BSW Nurse Present: Chana Bode, RN PT Present: Ralph Leyden, PT OT Present: Primitivo Gauze, OT SLP Present: Everardo Pacific, SLP PPS Coordinator present : Edson Snowball, PT     Current Status/Progress Goal Weekly Team Focus  Bowel/Bladder   Pt is continent B/B LBM 10/09/22   Pt will maintain continence with normal B/B pattern   Assist pt with toileting needs qshift/prn    Swallow/Nutrition/ Hydration               ADL's   min A toileting, min A LB dressing and UB dressing, CGA transfers, improving LUE AROM to use as a stabilizing assist   supervision with UB dressing, CGA LB dressing, bathing, toilet transfers; min A toileting   LUE NMR, activity tolerance, functional mobility/balance    Mobility   Beb mobility - CGA/ MinA; Transfers - supervision/ CGA; Ambulation - supervision with IV pole and sup/ CGA with no AD, improved activity tolerance   supervision bed mobility and transfers, CGA household ambulation, min A 1 step, supervision w/c mobility  Barriers - L hemi UE>LE; Focus - L LE/UE NMR, gait tolerance, dynamic standing balance, family educ     Communication   Supervision-Min A   Mod I   use of speech intelligibility strategies at the sentence and conversation level    Safety/Cognition/ Behavioral Observations  Min A   Supervision-Mod I   recall and problem solving regarding procedures to LVAD    Pain   Denies pain at this time.   Pt will be free feom pain and will verbalize pain as needed   Assess pt for pain qshift/prn and provide education on pain medications    Skin   Erethyma/redness to bilateral arms, LVAD access to left abdomen(incision), dressing in place   Skin will be free from infection with no further breakdown  Assess skin for infection and provide education on infection prevention and skin breakdown      Discharge Planning:  Patient discharhing home with cousins to provide 24/7 supervision. Daughter able to assist PRN. SW has not confirmed contact with cousins/daughter. IV ABX   Team Discussion: Patient with fluctuating edema in left UE. CVTS pleased with recovery and LVAD parameters WNL. Continue to note speech intelligibility and cognition deficits with less than 75% accuracy for recall. Struggles with weakness and LVAD management although endurance is better and mobility has improved.  Patient on target to meet rehab goals: yes, currently needs min assist for bathing and toileting. Needs mod assist for dressing. Able to complete sit- stand and toilet transfers with supervision.   *See Care Plan and progress notes for long and short-term goals.   Revisions  to Treatment Plan:  N/a   Teaching Needs: Safety, LVAD management, medications, dietary modification, transfers, toileting, etc.   Current Barriers to Discharge: Decreased caregiver support and new LVAD  with IV abx through 11/04/22  Possible Resolutions to Barriers: Family education HH follow up services DME: [facility]     Medical Summary Current Status: CVTS pleased with progress, LVAD parameters ok per cardiology, sequencing issues with  LVAD self management  Barriers to Discharge: Hypotension;Medical stability  Barriers to Discharge Comments: CVA deficits interfering with LVAD self management Possible Resolutions to [employer] Focus: work on LUE weakness to improve self management of LVAD, work on sequencing, emphasis on caregiver training, Cardiology adn CVTS following   Continued Need for Acute Rehabilitation Level of Care: The patient requires daily medical management by a physician with specialized training in physical medicine and rehabilitation for the following reasons: Direction of a multidisciplinary physical rehabilitation program to maximize functional independence : Yes Medical management of patient stability for increased activity during participation in an intensive rehabilitation regime.: Yes Analysis of laboratory values and/or radiology reports with any subsequent need for medication adjustment and/or medical intervention. : Yes   I attest that I was present, lead the team conference, and concur with the assessment and plan of the team.   Chana Bode B 10/12/2022, 8:06 AM

## 2022-10-12 NOTE — Progress Notes (Addendum)
Patient ID: Charles Holmes, male   DOB: August 15, 1965, 57 y.o.   MRN: [id number]  [facility] referral submitted to [facility].  Agency at capacity for insurance plan.  HH referral sent to [facility].  Patient decline due to service area.  HH referral sent to [facility].

## 2022-10-12 NOTE — Progress Notes (Signed)
LVAD Coordinator Rounding Note:  Admitted 08/10/22 due to acute on chronic CHF with cardiogenic shock. Milrinone dependent. Advance therapy workup completed, and pt deemed acceptable VAD candidate. Dental extractions completed 8/6. IABP placed 08/25/22.  HM 3 LVAD implanted on 08/29/22 by Dr Donata Clay under destination therapy criteria. 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 Result negative.  7/25 Admit with cardiogenic shock. Started milrinone and NE. 8/6 S/P 13 teeth extractions  8/9 IABP placed 8/13 S/p HM III LVAD implant + clipping left atrial appendage d/t severe thickening and invagination of mitral valve annulus impeding flows  CT Head 8/14 (initial) 1. 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 ASPECTS is 7. 2. No acute hemorrhage.  CT Angio Head/Neck 1. 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 2. Core infarct of 12 cc (somewhat underestimated compared to aspects) with 90 cc of penumbra. 3. Mild atherosclerosis.  Pt taken emergently to IR for percutaneous right common carotid arteriogram with thrombectomy. Revascularization achieved. Angio-seal closure device applied to left groin- clean, dry, and intact.   CT Head 8/15 @ 0450 Unchanged extent of infarct and hemorrhage in the right MCA distribution including small volume intraventricular clot. No hydrocephalus.  CT Head 8/17 @ 0701 Interval evolution of the right MCA territory infarct with decreased intraparenchymal and intraventricular hemorrhage. No hydrocephalus or midline shift.   Pt laying in bed on my arrival resting in between therapy sessions. States he is feeling great this morning and is looking forward to going home soon.   Pt tentative d/c date is October 1. VAD home equipment ordered 9/25.   Blood cultures from 9/6 positive for Enterococcus Faecalis. Subsequent blood cultures NGTD. ID following recommending 6 weeks of IV Ampicillin and Ceftriaxone  followed by chronic suppression with Amoxicillin.   VAD coordinator/nurses working with pt on changing power sources. He is unable to do this independently due to his left sided weakness. Modification of his power cords was made in hopes he can grip the cords better. Has practice power cords for use by pt. See below for education documentation. He is making progress with therapy.  Pt may not shower at this time.   Vital signs: Temp: 98.4 HR: 74 Doppler Pressure: 90 Automatic BP: 105/76 (87) O2 Sat: 95% on RA Wt: 183.6>191.1>190.9>190.2>184.8>...>163.1>162.5>171.3>172.4>176.6> 180.6>175.1>174.8>176.3>175.7>177.5>173>171.5>167.3>169.9>160.1 lbs   LVAD interrogation reveals:  Speed: 5400 Flow: 4.7 Power: 4.0 w PI: 2.8  Alarms: none Events: 6 PI events so far today  Hematocrit: 32 Fixed speed: 5400 Low speed limit: 5100  Drive Line:  Existing VAD dressing removed and site care performed using sterile technique. Drive line exit site cleaned with Chlora prep applicators x 2, rinsed with saline, allowed to dry, and Vashe moistened 2x2 placed around driveline then covered with dry 4x4. Exit site healing and unincorporated, the velour is fully implanted at exit site. Scant amount of serous drainage.Slight redness, no tenderness, foul odor or rash noted. Drive line anchor re-applied. Continue twice a week dressing changes on Monday/Thursday. Next dressing change due 10/16/22 by VAD coordinator or nurse champion only or [place].     Labs:  LDH trend: 596>441>165>384>345>312>312>350>327>309>316>305>298>277>273>298>280>285>251>252>205>193  INR trend: 1.4>1.6>1.1>1.1>1.3>1.2>1>1.3>1.4>1.5>1.6>2.8>2.8>2.3>2.2>2.3>2.6>2.6>1.7>2>1.8>1.7>2.1>1.8>2.4>2.3>2.0  AST/ALT trend: 218/45>96/30>73/23>63/36>58/37>69/50>93/72>123/103>143/150>117/138>119/138>92/127>87/123>50/71>46/65>46/43  Total Bili trend: 4.4>4.6>5.1>1.8>1.9>1.2>1.0>1.5>1.1>0.8>0.9>1.0>0.8  WBC trend:  11.6>13.7>19.6>16.1>23>26.6>21>24.6>18.1>16.5>13.2>13.1>12.5>11.6>10.5>10.8>13.1>9.5>9.1>9.7>7.8>6.7>6.7>6.4  Anticoagulation Plan: -INR Goal: 2.0 - 2.5 -ASA Dose: 81 mg  - Coumadin dosing per pharmacy  Blood Products:  IntraOp 8/14: - 4 FFP - 2 Platelets - 2 PRBC - 1 cyro - 449 cc of cellsaver -  DDAVP 20 mcg x 1   Device: Medtronic BiV -Therapies: ON VF ON >207 BPM FVT ON 176-207 BPM VT ON 171-207 BPM  Arrythmias:   Respiratory:   Infection:  09/01/22>> sputum cx>> NGTD>>final 09/01/22>> blood cx>> NGTD>>final 09/01/22>> urine cx>> NGTD>> final 09/13/22>>blood cxs>>no growth 5 days; final 09/22/22>>blood cxs>> Enterococcus faecalis  09/23/22>> blood cxs>> neg final 09/24/22>> blood cxs>> neg final  Renal:  9/23:7/0.71  Adverse Events on VAD: - 08/30/22: - Developed left sided weakness this am. CTA with R MCA infarct. Taken to IR for thrombectomy   Patient Education: Discharge teaching completed with pt's [name] on 9/5. See separate note for documentation.   Plan/Recommendations:  Please page VAD coordinator for any alarms or VAD equipment issues. Continue twice a week dressing changes on Monday/Thursday by VAD coordinator, Nurse Alla Feeling, or [name].  [name] RN VAD Coordinator  Office: [phone]  24/7 Pager: [phone]

## 2022-10-12 NOTE — Progress Notes (Signed)
PROGRESS NOTE   Subjective/Complaints:  Appreciate CVTS and Cardiology notes  Slept well without melatonin last noc , wants it changed to prn   ROS: Patient denies CP, SOB, N/V/D  Objective:   No results found. Recent Labs    10/10/22 0412 10/11/22 0436  WBC 6.7 6.4  HGB 9.3* 9.2*  HCT 32.5* 32.4*  PLT 254 243     Recent Labs    10/10/22 0412 10/11/22 0436  NA 138 139  K 4.0 3.8  CL 104 103  CO2 28 26  GLUCOSE 79 64*  BUN 8 11  CREATININE 0.77 0.58*  CALCIUM 8.5* 8.5*    Intake/Output Summary (Last 24 hours) at 10/12/2022 0804 Last data filed at 10/12/2022 0731 Gross per 24 hour  Intake 2463.15 ml  Output 900 ml  Net 1563.15 ml        Physical Exam: Vital Signs Blood pressure (!) 104/92, pulse 74, temperature 98.4 F (36.9 C), temperature source Oral, resp. rate 16, height 5\' 7"  (1.702 m), weight 72.6 kg, SpO2 97%.    General: No acute distress Mood and affect are appropriate Heart: Regular rate and rhythm no rubs murmurs or extra sounds Lungs: Clear to auscultation, breathing unlabored, no rales or wheezes Abdomen: Positive bowel sounds, soft nontender to palpation, nondistended Extremities: No clubbing, cyanosis, or edema Skin: No evidence of breakdown, no evidence of rash  Neuro:  Alert and oriented x 3.   Normal language and speech. Cranial nerve exam unremarkable except for mild left central 7. MMT: LUE delt, bi, tri, grip   3/5 prox to distal. LLE 4-/5 throughout. Pt with intact  LT sense on left but feels a little different compared to R side . No abnl resting tone. DTR's tr to1+.  Neuro exam stable 9/24 Musculoskeletal:no  pain with AROM in all 4 ext    Assessment/Plan: 1. Functional deficits which require 3+ hours per day of interdisciplinary therapy in a comprehensive inpatient rehab setting. Physiatrist is providing close team supervision and 24 hour management of active medical  problems listed below. Physiatrist and rehab team continue to assess barriers to discharge/monitor patient progress toward functional and medical goals  Care Tool:  Bathing    Body parts bathed by patient: Chest, Left arm, Abdomen, Front perineal area, Face, Left upper leg   Body parts bathed by helper: Right arm, Buttocks, Left lower leg, Right lower leg     Bathing assist Assist Level: Moderate Assistance - Patient 50 - 74%     Upper Body Dressing/Undressing Upper body dressing   What is the patient wearing?: Pull over shirt    Upper body assist Assist Level: Moderate Assistance - Patient 50 - 74%    Lower Body Dressing/Undressing Lower body dressing      What is the patient wearing?: Pants     Lower body assist Assist for lower body dressing: Minimal Assistance - Patient > 75%     Toileting Toileting    Toileting assist Assist for toileting: Minimal Assistance - Patient > 75%     Transfers Chair/bed transfer  Transfers assist  Chair/bed transfer activity did not occur: Safety/medical concerns (fatigue)  Chair/bed transfer assist level: Supervision/Verbal  cueing     Locomotion Ambulation   Ambulation assist   Ambulation activity did not occur: Safety/medical concerns (fatigue)  Assist level: Contact Guard/Touching assist Assistive device: Walker-rolling Max distance: 150'   Walk 10 feet activity   Assist  Walk 10 feet activity did not occur: Safety/medical concerns (fatigue)  Assist level: Contact Guard/Touching assist Assistive device: Walker-rolling   Walk 50 feet activity   Assist Walk 50 feet with 2 turns activity did not occur: Safety/medical concerns  Assist level: Contact Guard/Touching assist Assistive device: Walker-rolling    Walk 150 feet activity   Assist Walk 150 feet activity did not occur: Safety/medical concerns  Assist level: Contact Guard/Touching assist Assistive device: Walker-rolling    Walk 10 feet on uneven  surface  activity   Assist Walk 10 feet on uneven surfaces activity did not occur: Safety/medical concerns (fatigue)         Wheelchair     Assist Is the patient using a wheelchair?: Yes Type of Wheelchair: Manual Wheelchair activity did not occur: Safety/medical concerns (fatigue)         Wheelchair 50 feet with 2 turns activity    Assist    Wheelchair 50 feet with 2 turns activity did not occur: Safety/medical concerns       Wheelchair 150 feet activity     Assist  Wheelchair 150 feet activity did not occur: Safety/medical concerns       Blood pressure (!) 104/92, pulse 74, temperature 98.4 F (36.9 C), temperature source Oral, resp. rate 16, height 5\' 7"  (1.702 m), weight 72.6 kg, SpO2 97%.  Medical Problem List and Plan: 1. Functional deficits secondary to acute right MCA infarct/ LVAD pt             -patient may shower             -ELOS/Goals: modI/S 10/17/22   -Continue CIR therapies including PT, OT, and SLP    2.  Antithrombotics: -DVT/anticoagulation:  Pharmaceutical: Coumadin per pharmacy -9/24 INR up to 2.4 , 9/26 INR 2.0              -antiplatelet therapy: Aspirin 81 mg daily   3. Pain Management: Tylenol and Tramadol as needed   -LUQ Is due to LVAD driveline--reposition self as possible when needed. 4. Mood/Behavior/Sleep: delirium/insomnia - team to provide ongoing ego support             -continue mirtazapine 7.5 mg q HS             -continue sertraline 50 mg daily             -continue melatonin 3 mg q HS -Started Trazodone 50mg  at bedtime on 09/29/22             -antipsychotic agents: n/a -9/13- mood somewhat better than when saw pt Tuesday- con't regimen and monitor -09/30/22 not sleeping well, increased Melatonin to 5mg  QHS; if not helping, may consider restarting mirtazapine as above 9/18 poor sleep related to freq urination related to lasix , intake of fluids is minimal may need to use comp hose to prvent accumulation during  the day and prevent nocturnal diuresis  -10/07/22 mirtazepine restarted 7.5mg  QHS -- improved sleep   5. Neuropsych/cognition: This patient is capable of making decisions on his own behalf.   6. Skin/Wound Care: Routine skin care checks   7. Fluids/Electrolytes/Nutrition: strict Is and Os and follow-up chemistries             -poor PO  intake on Marinol             -continue supplements   -9/12 labs reviewed. low albumin still continue interventions above -9/24 labs stable, daily labs per HF team-->MWF  8: Hypotension but difficult to assess due to LVAD    9/13- BP soft, but denies orthostatic hypotension -9/14-15/24 BPs generally stable/improving; was previously on midodrine, but per HF team pt was stable off it;  Difficult to measure BP due to LVAD as well as LUE edema post CVA -9/24 bp's better off of lasix Vitals:   10/10/22 0434 10/10/22 0736 10/10/22 1204 10/10/22 1454  BP: 97/83 90/76 90/76  (!) 89/65   10/10/22 1642 10/10/22 1932 10/11/22 0010 10/11/22 0412  BP: (!) 88/76 102/77 107/83 108/80   10/11/22 0800 10/11/22 1543 10/11/22 2015 10/12/22 0000  BP: 107/85 92/60 110/83 (!) 104/92   Systolic mainly in 100s now which is better for CVD  9: Hyperlipidemia: continue atorvastatin 80mg  daily   10: Enterococcus bacteremia: 6 weeks IV antibiotic, ceftriaxone and ampicillin last dose 10/19 will go home with PICC and HH             -follow-up with ID  -09/30/22 PICC placed   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: Home meds: Coreg 3.125, Lasix 40mg , Lostartan 25mg , Aldactone 12.5, Farxiga 10mg , mexilitene --holding all currently   12: Acute on chronic systolic heart failure with cardiogenic shock s/p LVAD             -daily weights balanced so far             -continue digoxin 0.125 mg daily             -continue mexiletine 250 mg BID -continue midodrine 5 mg q 8 hours--not ordered, per HF team on 9/13 pt was stable off Midodrine; hold for now             -continue sildenafil 20 mg q 8 hours              -continue warfarin per pharmacy Managed by Heart Failure team - coordinating with primary service for activity tolerance monitoring  -10/11/22 wt down today ?technique Filed Weights   10/09/22 0452 10/10/22 0451 10/11/22 0700  Weight: 75.9 kg 77.1 kg 72.6 kg    13: Hx of VT: continue mexiletine, digoxin; has ICD   14: DM-2: A1c = 5.5%; monitor PO intake; DC SSI (Farxiga at home) .cbg  15: OSA: needs outpatient sleep study   16: Anemia, iron deficiency: follow-up CBC--most recent hgb 9.5--lab pending still today?  -10/11/22 Hgb stable 9.3, monitor    Latest Ref Rng & Units 10/11/2022    4:36 AM 10/10/2022    4:12 AM 10/09/2022    3:36 AM  CBC  WBC 4.0 - 10.5 K/uL 6.4  6.7  6.7   Hemoglobin 13.0 - 17.0 g/dL 9.2  9.3  9.2   Hematocrit 39.0 - 52.0 % 32.4  32.5  31.6   Platelets 150 - 400 K/uL 243  254  253       17: Severe COPD:              -continue Ellipta one puff daily   18: S/p tooth extractions this admission: monitor PO tolerance   19: Glaucoma: continue Cosopt and Xalatan   20.  Candidiasis in groin start nystatin powder   21.  Dysuria- UA neg , on broad spectrum IV abx for bacteremia-- resolved 10/07/22  22.  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   - pt on warfarin which is now therapeutic.On pharmacy protocol   LOS: 15 days A FACE TO FACE EVALUATION WAS PERFORMED  Erick Colace 10/12/2022, 8:04 AM

## 2022-10-12 NOTE — Progress Notes (Signed)
Physical Therapy Session Note  Patient Details  Name: Charles Holmes MRN: [id number] Date of Birth: [birth year]  Today's Date: 10/12/2022 PT Individual Time: 0102-7253 PT Individual Time Calculation (min): 41 min   Short Term Goals: Week 1:  PT Short Term Goal 1 (Week 1): Pt will require min A consisntely for bed mobility PT Short Term Goal 1 - Progress (Week 1): Met PT Short Term Goal 2 (Week 1): Pt will require mod A with sit to stand with LRAD PT Short Term Goal 2 - Progress (Week 1): Met PT Short Term Goal 3 (Week 1): Pt will require max A x 1 for gait x 10 ft with LRAD PT Short Term Goal 3 - Progress (Week 1): Met Week 2:  PT Short Term Goal 1 (Week 2): Pt will require consistent supervision for bed mobility. PT Short Term Goal 2 (Week 2): Pt will require consistent CGA for sit to stand with LRAD. PT Short Term Goal 3 (Week 2): Pt will require supervision for ambulation >100 ft with LRAD and no rest breaks.  Skilled Therapeutic Interventions/Progress Updates: Patient supine with HOB elevated (attending physician present) on entrance to room. Patient alert and agreeable to PT session.   Patient reported no pain, only fatigue at beginning of PT session. Pt started off session with switching LVAD home unit to the mobile unit with modA and cues to ensure that connection is fully engaged (pt would continuously rotate fastening mechanism with no success). Pt required increased time/effort to perform task. Once pt switched units, pt requested to use the toilet to have a BM and bladder void (pt also stated not understanding why he cannot ambulate to bathroom alone - PTA educated that is is for his safety as pt requires assistance to switch LVAD units, and to manage IV pole over lip of bathroom). PTA donned harness for batteries for time management and pt supine to sit EOB with supervision (HOB elevated). Pt then sit to stand with light CGA/supervision and ambulated to bathroom with supervision (PTA  managing IV pole). Pt CGA to sit to toilet for safety as it is a low height. Pt performed self posterior care with supervision and stood with min/heavy minA from toilet. PTA finished posterior care with cleaning wash cloth for hygiene (PTA donned brief for time management). Pt then ambulated back to EOB and transitioned sit to supine (HOB elevated) with supervision. Pt required to be scooted to [facility] with VC to push B LE into bed to assist (PTA use of chuck pad with modA).   Patient supine in bed at end of session with brakes locked, bed alarm set, and all needs within reach.      Therapy Documentation Precautions:  Precautions Precautions: Sternal, Fall, Other (comment) Precaution Comments: LVAD Restrictions Weight Bearing Restrictions: No RUE Weight Bearing:  (sternal precaution) LUE Weight Bearing:  (sternal precaution) RLE Weight Bearing: Weight bearing as tolerated Other Position/Activity Restrictions: Sternal Precautions   Therapy/Group: Individual Therapy  Aadin Gaut PTA 10/12/2022, 12:16 PM

## 2022-10-13 LAB — BASIC METABOLIC PANEL
Anion gap: 9 (ref 5–15)
BUN: 7 mg/dL (ref 6–20)
CO2: 26 mmol/L (ref 22–32)
Calcium: 8.4 mg/dL — ABNORMAL LOW (ref 8.9–10.3)
Chloride: 105 mmol/L (ref 98–111)
Creatinine, Ser: 0.9 mg/dL (ref 0.61–1.24)
GFR, Estimated: >60 mL/min (ref >60)
Glucose, Bld: 112 mg/dL — ABNORMAL HIGH (ref 70–99)
Potassium: 4 mmol/L (ref 3.5–5.1)
Sodium: 140 mmol/L (ref 135–145)

## 2022-10-13 LAB — LACTATE DEHYDROGENASE: LDH: 202 U/L — ABNORMAL HIGH (ref 98–192)

## 2022-10-13 LAB — PROTIME-INR
INR: 1.8 — ABNORMAL HIGH (ref 0.8–1.2)
Prothrombin Time: 21.1 s — ABNORMAL HIGH (ref 11.4–15.2)

## 2022-10-13 LAB — MAGNESIUM: Magnesium: 2 mg/dL (ref 1.7–2.4)

## 2022-10-13 MED ORDER — WARFARIN SODIUM 2.5 MG PO TABS
5.0000 mg | ORAL_TABLET | Freq: Once | ORAL | Status: AC
  Administered 2022-10-13: 5 mg via ORAL
  Filled 2022-10-13: qty 2

## 2022-10-13 MED ORDER — WARFARIN SODIUM 4 MG PO TABS
4.0000 mg | ORAL_TABLET | Freq: Once | ORAL | Status: DC
  Filled 2022-10-13: qty 1

## 2022-10-13 NOTE — Progress Notes (Signed)
Occupational Therapy Weekly Progress Note  Patient Details  Name: Charles Holmes MRN: [id number] Date of Birth: [birth year]  Beginning of progress report period: October 05, 2022 End of progress report period: October 13, 2022  Today's Date: 10/13/2022 OT Individual Time: 9528-4132 OT Individual Time Calculation (min): 75 min    Patient has met 3 of 4 short term goals.  Pt is making good progress with his mobility and self care skills and is also progress with LUE AROM and function. He can now lift arm to 120 degrees and grasp onto objects.  He does not have the dexterity and strength to be completely independent with his LVAD cords. His overall endurance has improved a great deal and he is standing and walking further to not need to rely on a w/c.   Patient continues to demonstrate the following deficits: decreased cardiorespiratoy endurance, unbalanced muscle activation and decreased coordination, delayed processing, and decreased standing balance and hemiplegia and therefore will continue to benefit from skilled OT intervention to enhance overall performance with BADL.  Patient progressing toward long term goals..  Continue plan of care. LTGs of standing balance and toilet transfers upgraded to supervision, but LTG of dressing and toileting downgraded to min A vs CGA due to limited L hand/arm function due to CVA.   OT Short Term Goals Week 1:  OT Short Term Goal 1 (Week 1): Pt will complete toilet transfers min A using LRAD OT Short Term Goal 1 - Progress (Week 1): Met OT Short Term Goal 2 (Week 1): Pt will recall hemi-dressing techniques with min verbal cues OT Short Term Goal 2 - Progress (Week 1): Met OT Short Term Goal 3 (Week 1): Pt will maintain sternal precautions during BADLs and functional transfers at 95% safe with min verbal cues OT Short Term Goal 3 - Progress (Week 1): Met OT Short Term Goal 4 (Week 1): Pt will maintain dynamic standing balance using LRAD during  functional task >1 minute to demonstrate increased activity tolerance OT Short Term Goal 4 - Progress (Week 1): Met Week 2:  OT Short Term Goal 1 (Week 2): Pt will demonstrate improved L FMC to fasten and unfasten LVAD connectors in less than 1 minute. OT Short Term Goal 1 - Progress (Week 2): Progressing toward goal OT Short Term Goal 2 (Week 2): Pt will demonstrate improved L hand function to grasp pants to pull over L side of hips with min A. OT Short Term Goal 2 - Progress (Week 2): Met OT Short Term Goal 3 (Week 2): Pt will demonstrate improved memory to recall steps of LVAD battery change with min cues. OT Short Term Goal 3 - Progress (Week 2): Met OT Short Term Goal 4 (Week 2): Pt will complete BSC or toilet transfers with min A. OT Short Term Goal 4 - Progress (Week 2): Met Week 3:  OT Short Term Goal 1 (Week 3): STGs = LTGs  Skilled Therapeutic Interventions/Progress Updates:    Pt received in recliner, assisted pt with donning sneakers. Pt already connected to battery packs.   Ambulated to dayroom gym with CGA to support L arm as he pushed IV pole with R hand.   Pt sat at table to engage in LUE NMR exercises: -saebo Mobile Arm support for sh flexion, abduction and elbow ext -mirror box exercises to stimulate L hand motion -a/arom with table top slides -lateral pinch strength with yellow clothes pin easily and more difficulty with blue level -tip pinch prehension with turning various  types of fasteners  -isometric wrist extension holds of 10 seconds, resisted wrist flexion  His cardiologist came by during the session. Pt c/o rear shoulder pain on L during arm exercises. The MD stated his cardiac surgery is likely causing that discomfort more than weakness from his CVA.    Pt ambulated back to room and requested to lay down and doff TED hose.  Pt positioned with wedge to support L hand as he continues to have edema in hand. Reminded pt to work on AROM of fingers and wrist.   Bed  alarm set and all needs met.    Therapy Documentation Precautions:  Precautions Precautions: Sternal, Fall, Other (comment) Precaution Comments: LVAD Restrictions Weight Bearing Restrictions: No RUE Weight Bearing:  (sternal precaution) LUE Weight Bearing:  (sternal precaution) RLE Weight Bearing: Weight bearing as tolerated Other Position/Activity Restrictions: Sternal Precautions     Pain: Pain Assessment Pain Scale: 0-10 Pain Score: 0-No pain ADL: ADL Eating: Set up Where Assessed-Eating: Edge of bed Grooming: Setup Where Assessed-Grooming: Standing at sink Upper Body Bathing: Minimal assistance Where Assessed-Upper Body Bathing: Edge of bed Lower Body Bathing: Moderate assistance Where Assessed-Lower Body Bathing: Edge of bed Upper Body Dressing: Moderate assistance Where Assessed-Upper Body Dressing: Edge of bed Lower Body Dressing: Moderate assistance Where Assessed-Lower Body Dressing: Edge of bed Toileting: Minimal assistance Where Assessed-Toileting: Teacher, adult education: Close supervision Toilet Transfer Method: Proofreader: Engineer, technical sales: Not assessed Film/video editor: Not assessed    Therapy/Group: Individual Therapy  Riley Papin 10/13/2022, 10:22 AM

## 2022-10-13 NOTE — Progress Notes (Signed)
Physical Therapy Session Note  Patient Details  Name: Charles Holmes MRN: [id number] Date of Birth: 07-03-1965  Today's Date: 10/13/2022 PT Individual Time: 0803-0858 PT Individual Time Calculation (min): 55 min   Short Term Goals: Week 1:  PT Short Term Goal 1 (Week 1): Pt will require min A consisntely for bed mobility PT Short Term Goal 1 - Progress (Week 1): Met PT Short Term Goal 2 (Week 1): Pt will require mod A with sit to stand with LRAD PT Short Term Goal 2 - Progress (Week 1): Met PT Short Term Goal 3 (Week 1): Pt will require max A x 1 for gait x 10 ft with LRAD PT Short Term Goal 3 - Progress (Week 1): Met Week 2:  PT Short Term Goal 1 (Week 2): Pt will require consistent supervision for bed mobility. PT Short Term Goal 2 (Week 2): Pt will require consistent CGA for sit to stand with LRAD. PT Short Term Goal 3 (Week 2): Pt will require supervision for ambulation >100 ft with LRAD and no rest breaks.  Skilled Therapeutic Interventions/Progress Updates: Patient sitting in recliner on entrance to room. Patient alert and agreeable to PT session.   Patient reported no pain at beginning of PT session. Pt did report having a headache that started around 8 pm the previous night (did not alert nsg) right above R ear that elevated up to 8/10 last night, and is around a 2/10 at beginning of session. Pt PICC alarm was alerting that there was air in line. PTA alerted attending RN (arrived shortly after). Pt cued to change home LVAD unit to mobile unit with PTA placing required steps on pt's lap. Pt inserted batteries to their holders correctly with no issue (only required increased time to align connection). Pt then removed home unit, and switched to battery (pt recalled starting with white first). Pt attempted to connect the home unit to the battery instead of the mobile unit and required assistance with questioning cues to alert pt to error (pt placing both ends together - which did not fit  due to being same size - multiple times before PTA intervened). Pt then switched to correct placement and required cues to ensure connection is entirely in place before attempting to fasten. Pt then connected other battery to connection with increased time/effort. IV lines removed and PTA donned B TED (knee) dependently, and donned personal shoes to pt for time management. Pt stood from recliner at first with multiple attempts to anteriorly weight shift (ending in pt requiring rest break due to grunting and exerting energy). Pt encouraged and educated to stop after first failed attempt, reassess positioning, and try again (scooting forward, and pushing off of recliner arm rest vs R knee with R UE). Pt then stood with light CGA/close supervision (cued to assess any lightheadedness prior to stepping away from recliner for safety). Pt then ambulated to sink with no AD and CGA to shave mustache with electric razor with supervision. Pt them ambulated from room<around nsg/day room loop<and back to room with AD (CGA provided for safety and with WC follow for safety). Pt demos no LOB, safe navigation of obstacles in area, and ability to talk and walk without requiring increased standing rest breaks. Pt back in room in recliner with nsg present.       Therapy Documentation Precautions:  Precautions Precautions: Sternal, Fall, Other (comment) Precaution Comments: LVAD Restrictions Weight Bearing Restrictions: No RUE Weight Bearing:  (sternal precaution) LUE Weight Bearing:  (sternal precaution)  RLE Weight Bearing: Weight bearing as tolerated Other Position/Activity Restrictions: Sternal Precautions  Therapy/Group: Individual Therapy  Naiomi Musto PTA 10/13/2022, 12:23 PM

## 2022-10-13 NOTE — Progress Notes (Addendum)
Patient ID: Charles Holmes, male   DOB: 04/16/1965, 57 y.o.   MRN: [id number]     Advanced Heart Failure Rounding Note  PCP-Cardiologist: Norman Herrlich, MD  [facility]: Dr. Gala Romney   Subjective:    Admitted to CIR on 09/11  INR 1.8   Denies SOB. Says L had is getting stronger.   LVAD Interrogation HM III: Speed: 5400 Flow: 4.1 PI: 4.8  Power: 4. Assess working with PT.    aspirin EC  81 mg Oral Daily   atorvastatin  80 mg Oral Daily   Chlorhexidine Gluconate Cloth  6 each Topical BID   digoxin  0.125 mg Oral Daily   dorzolamide-timolol  1 drop Both Eyes BID   Gerhardt's butt cream   Topical TID   influenza vac split trivalent PF  0.5 mL Intramuscular Tomorrow-1000   latanoprost  1 drop Both Eyes QHS   mexiletine  250 mg Oral BID   mometasone-formoterol  2 puff Inhalation BID   multivitamin with minerals  1 tablet Oral Daily   nystatin   Topical BID   pantoprazole  40 mg Oral Daily   sertraline  50 mg Oral Daily   sildenafil  20 mg Oral TID   spironolactone  25 mg Oral Daily   umeclidinium bromide  1 puff Inhalation Daily   warfarin  4 mg Oral ONCE-1600   Warfarin - Pharmacist Dosing Inpatient   Does not apply q1600    Objective:   Weight Range: 75.5 kg Body mass index is 26.07 kg/m.   Vital Signs:   Temp:  [97.2 F (36.2 C)-98.7 F (37.1 C)] 98.7 F (37.1 C) (09/27 0438) Pulse Rate:  [65-89] 65 (09/27 0833) Resp:  [17-20] 20 (09/27 0438) BP: (87-125)/(58-103) 93/81 (09/27 0438) SpO2:  [95 %-97 %] 97 % (09/27 0438) Weight:  [75.5 kg] 75.5 kg (09/26 1800) Last BM Date : 10/10/22  Weight change: Filed Weights   10/10/22 0451 10/11/22 0700 10/12/22 1800  Weight: 77.1 kg 72.6 kg 75.5 kg   Intake/Output:   Intake/Output Summary (Last 24 hours) at 10/13/2022 0912 Last data filed at 10/13/2022 0758 Gross per 24 hour  Intake 1220 ml  Output 800 ml  Net 420 ml     Physical Exam  Physical Exam: GENERAL: No acute distress. Walking with therapy  HEENT: normal   NECK: Supple, JVP 6-7  .  2+ bilaterally, no bruits.  No lymphadenopathy or thyromegaly appreciated.   CARDIAC:  Mechanical heart sounds with LVAD hum present.  LUNGS:  Clear to auscultation bilaterally.  ABDOMEN:  Soft, round, nontender, positive bowel sounds x4.     LVAD exit site: well-healed and incorporated.  Dressing dry and intact.  No erythema or drainage.  Stabilization device present and accurately applied.   EXTREMITIES:  Warm and dry, no cyanosis, clubbing, rash. Trace edema. RUE PICC  NEUROLOGIC:  Alert and oriented x 3.    No aphasia.  No dysarthria.  Affect pleasant.     Patient Profile  Charles Holmes is a 57 y.o. 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 Admitted with cardiogenic shock, stabilized and underwent HM3 LVAD. Post implant course c/b acute CVA and enterococcus faecalis bacteremia.   Assessment/Plan  1.  Acute on chronic Systolic HF-->Cardiogenic Shock  - Diagnosed 11/2019. Presented with VT. LHC 70% LAD  - cMRI 12/21 concerning for sarcoid and EF 18%.  - PET 2/22 at [facility] EF 25% + active  sarcoid - [employer] CRT-D upgrade in 06/08/21 - Echo 07/10/22: EF <20%, RV okay, mod pericardial effusion, mod Charles/TR - Admitted 07/25 with cardiogenic shock. - RHC: Nonobstructive CAD, severely elevated filling pressures and low Fick CO/CI (2.7/1.4) - 08/13 HM III LVAD implant + clipping LAA d/t severe thickening and invagination of mitral valve annulus impeding flows.  - Apical core sent - no mention of sarcoid - Echo 8/26 mod-sev RV dysfunction. Speed increased to 5400 8/26 - Ramp Echo 9/4, speed increased to 5400  - MAP 80s, stable.  Volume status ok.  - On Sildenafil 20 mg tid.  - Continue spironolactone 25mg  daily - Continue digoxin 0.125 mg daily - BMET in am.   2. HM-3 LVAD - Assessed controller while in PT.  - LDH stable - Continue ASA 81 - INR 1.8  Dosing per PharmD - Caregiver education completed.    3.  Acute stroke - Hx CVA 06/24 -Admitted 06/24 w/ R MCA stroke. S/p TPA and mechanical clot extraction. No residual deficits. Likely cardioembolic in setting of severe LV dysfunction. - Developed left sided weakness 08/14. CTA with R MCA infarct. Taken to IR for thrombectomy - Repeat CT head with small to moderate size hemorrhagic conversion.  - repeat head CT on 8/17 w/ improved hemorrhagic CVA - On warfarin.  - Functional deficits improving. L hand getting a little stronger.  - Continue ASA 81 mg daily  - Appreciate CIR  4. Enterococcus faecalis bacteremia - Bcx 2/2 on 9/6 - ID consult 9/6 -> ampicillin and ceftriaxone - TEE recommended but doubt it will change management as VAD cannot be removed and will require IV abx followed by long-term suppression - Echo 09/10 - no obvious vegetations -  Repeat BCx 9/8 NGTD. - ID recommending 6 weeks IV amp/ceftriaxone followed by long-term suppression with amoxicillin. Stop date 11/04/22   5. Hx VT - ln setting of potential sarcoid heart disease  - Off amio due to tremor. Continue mexiletine, ICD in place   6. CAD - LHC 12/07/19 70-% LAD, no intervention - LHC 8/24 non obstructive CAD.  - Continue statin. On aspirin for VAD.  - No chest pain.   7. 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   8. 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 Iron deficiency anemia/ Post-op anemia - Has had IV Fe.    10. Pulmonary  - PFTs with severe obstructive defect, response to bronchodilator. FEV1 1.04L, FEV1/FVC 48% - Stable  11. Psych - Depression and delirium post VAD implant. Prolonged hospital course.  - Improving - Continue sertraline  12. Left arm swelling - 9/20 LUE dopplers with chronic superficial vein thrombosis.     Amy Clegg NP-C  10/13/2022  Patient seen and examined with the above-signed Advanced Practice Provider and/or Housestaff. I personally reviewed laboratory data, imaging studies and relevant notes. I independently examined the patient and formulated the important aspects of the plan. I have edited the note to reflect any of my changes or salient points. I have personally discussed the plan with the patient and/or family.  Continues with rehab. No CP or SOB. LUE still mildly weak.   General:  NAD.  HEENT: normal  Neck: supple. JVP not elevated.  Carotids 2+ bilat; no bruits. No lymphadenopathy or thryomegaly appreciated. Cor: LVAD  hum.  Lungs: Clear. Abdomen: soft, nontender, non-distended. No hepatosplenomegaly. No bruits or masses. Good bowel sounds. Driveline site clean. Anchor in place.  Extremities: no cyanosis, clubbing, rash. Warm no edema   Neuro: alert & oriented x 3. LUE weak  Continue rehab. Stable from HF perspective. MAPs ok off midodrine.   VAD interrogated personally. Parameters stable.  INR 1.8. Discussed dosing with PharmD personally.(Had missed dose a few days ago)  Arvilla Meres, MD  10:18 AM

## 2022-10-13 NOTE — Progress Notes (Addendum)
ANTICOAGULATION CONSULT NOTE - Follow-up  Pharmacy Consult for warfarin Indication:  LVAD  Allergies  Allergen Reactions   Pacerone [Amiodarone] Other (See Comments)    Severe tremors   Percocet [Oxycodone-Acetaminophen] Itching    Patient Measurements: Height: 5\' 7"  (170.2 cm) Weight: 75.5 kg (166 lb 7.2 oz) IBW/kg (Calculated) : 66.1 Heparin Dosing Weight: 87kg  Vital Signs: Temp: 98.7 F (37.1 C) (09/27 0438) Temp Source: Oral (09/27 0438) BP: 93/81 (09/27 0438) Pulse Rate: 65 (09/27 0833)  Labs: Recent Labs    10/11/22 0436 10/12/22 0305 10/13/22 0348  HGB 9.2*  --   --   HCT 32.4*  --   --   PLT 243  --   --   LABPROT 25.6* 22.6* 21.1*  INR 2.3* 2.0* 1.8*  CREATININE 0.58*  --   --     Estimated Creatinine Clearance: 93 mL/min (A) (by C-G formula based on SCr of 0.58 mg/dL (L)).   Medical History: Past Medical History:  Diagnosis Date   CAD (coronary artery disease)    CHF (congestive heart failure) (HCC)    COPD (chronic obstructive pulmonary disease) (HCC)    GERD (gastroesophageal reflux disease)    Hyperlipidemia    Hypertension    LVAD (left ventricular assist device) present (HCC)    Stroke (HCC)    Systolic heart failure (HCC) 2021   LVEF 18%, RVEF 38% on cardiac MRI 12/19/2019. possible cardiac sarcoidosis.   Wide-complex tachycardia 2021   wears LifeVest     Assessment: 57 yoM on apixaban PTA for hx AF admitted for LVAD workup. Pt s/p HM3 implant on 8/13 c/b acute CVA postop, on aspirin 81 mg daily. Started low dose heparin + warfarin started 8/19. Low-dose heparin was stopped 8/31.  INR therapeutic at 1.8 (-0.2). No signs or symptoms of bleeding. CBC and LDH stable. 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  Goal of Therapy:  INR 2-2.5 Monitor platelets by anticoagulation protocol: Yes   Plan:  Warfarin 5mg  x1 Monitor daily INR, CBC   Wilmer Floor, PharmD PGY2 Cardiology Pharmacy Resident Please check AMION for  all [facility] Pharmacy numbers 10/13/2022

## 2022-10-13 NOTE — Plan of Care (Signed)
  Problem: RH Balance Goal: LTG Patient will maintain dynamic standing with ADLs (OT) Description: LTG:  Patient will maintain dynamic standing balance with assist during activities of daily living (OT)  Flowsheets (Taken 10/13/2022 1017) LTG: Pt will maintain dynamic standing balance during ADLs with: (LTG upgraded to supervision based on pt's progress.) Supervision/Verbal cueing Note: LTG upgraded to supervision based on pt's progress.    Problem: RH Bathing Goal: LTG Patient will bathe all body parts with assist levels (OT) Description: LTG: Patient will bathe all body parts with assist levels (OT) Flowsheets (Taken 10/13/2022 1017) LTG: Pt will perform bathing with assistance level/cueing: (LTG downgraded due to L hemiplegia and limited grasp of L hand.) Minimal Assistance - Patient > 75% LTG: Position pt will perform bathing: Edge of bed Note: LTG downgraded due to L hemiplegia and limited grasp of L hand.    Problem: RH Dressing Goal: LTG Patient will perform upper body dressing (OT) Description: LTG Patient will perform upper body dressing with assist, with/without cues (OT). Flowsheets (Taken 10/13/2022 1017) LTG: Pt will perform upper body dressing with assistance level of: (LTG downgraded due to L hemiplegia and limited grasp of L hand.) Minimal Assistance - Patient > 75% Note: LTG downgraded due to L hemiplegia and limited grasp of L hand.  Goal: LTG Patient will perform lower body dressing w/assist (OT) Description: LTG: Patient will perform lower body dressing with assist, with/without cues in positioning using equipment (OT) Flowsheets (Taken 10/13/2022 1017) LTG: Pt will perform lower body dressing with assistance level of: (LTG downgraded due to L hemiplegia and limited grasp of L hand.) Minimal Assistance - Patient > 75% Note: LTG downgraded due to L hemiplegia and limited grasp of L hand.    Problem: RH Toilet Transfers Goal: LTG Patient will perform toilet transfers  w/assist (OT) Description: LTG: Patient will perform toilet transfers with assist, with/without cues using equipment (OT) Flowsheets (Taken 10/13/2022 1017) LTG: Pt will perform toilet transfers with assistance level of: (LTG upgraded to supervision based on pt's progress.) Supervision/Verbal cueing Note: LTG upgraded to supervision based on pt's progress.

## 2022-10-13 NOTE — Progress Notes (Signed)
Occupational Therapy Session Note  Patient Details  Name: TORON BOWRING MRN: [id number] Date of Birth: [birth year]  Today's Date: 10/13/2022 OT Missed Time: 60 Minutes Missed Time Reason: Patient fatigue  Pt received upright in bed, asleep. Easily woken, however not agreeable to session due to global fatigue. OT offered bed level, in room and self-care activities however pt politely requested to rest. All needs met at therapist departure. Pt missed 60 mins of OT intervention secondary to fatigue; OT will make up missed time as able.     Jameela Michna E Reshaun Briseno, MS, OTR/L  10/13/2022, 3:05 PM

## 2022-10-13 NOTE — Progress Notes (Signed)
LVAD Coordinator Rounding Note:  Admitted 08/10/22 due to acute on chronic CHF with cardiogenic shock. Milrinone dependent. Advance therapy workup completed, and pt deemed acceptable VAD candidate. Dental extractions completed 8/6. IABP placed 08/25/22.  HM 3 LVAD implanted on 08/29/22 by Dr Donata Clay under destination therapy criteria. 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 Result negative.  7/25 Admit with cardiogenic shock. Started milrinone and NE. 8/6 S/P 13 teeth extractions  8/9 IABP placed 8/13 S/p HM III LVAD implant + clipping left atrial appendage d/t severe thickening and invagination of mitral valve annulus impeding flows  CT Head 8/14 (initial) 1. 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 ASPECTS is 7. 2. No acute hemorrhage.  CT Angio Head/Neck 1. 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 2. Core infarct of 12 cc (somewhat underestimated compared to aspects) with 90 cc of penumbra. 3. Mild atherosclerosis.  Pt taken emergently to IR for percutaneous right common carotid arteriogram with thrombectomy. Revascularization achieved. Angio-seal closure device applied to left groin- clean, dry, and intact.   CT Head 8/15 @ 0450 Unchanged extent of infarct and hemorrhage in the right MCA distribution including small volume intraventricular clot. No hydrocephalus.  CT Head 8/17 @ 0701 Interval evolution of the right MCA territory infarct with decreased intraparenchymal and intraventricular hemorrhage. No hydrocephalus or midline shift.   Pt sitting up in the recliner, about to start OT session. States he is feeling great this morning and is looking forward to going home soon.   VAD home equipment has arrived. Tentative discharge date 10/17/22.   Blood cultures from 9/6 positive for Enterococcus Faecalis. Subsequent blood cultures NGTD. ID following recommending 6 weeks of IV Ampicillin and Ceftriaxone followed by  chronic suppression with Amoxicillin.   PT/OT/VAD coordinators are working with pt on changing power sources. He is unable to do this independently due to his left sided weakness. Modification of his power cords was made in hopes he can grip the cords better. Has practice power cords for use by pt. He is making progress with therapy, but is still having difficulty with changes. Will need supervision/hands on assistance with power source changes at home.   Pt may not shower at this time.   Vital signs: Temp: 98.7 HR: 67 Doppler Pressure: 84 Automatic BP: 93/81 (86) O2 Sat: 95% on RA Wt: 183.6>191.1>190.9>190.2>184.8>...>163.1>162.5>171.3>172.4>176.6> 180.6>175.1>174.8>176.3>175.7>177.5>173>171.5>167.3>169.9>160.1 lbs   LVAD interrogation reveals:  Speed: 5400 Flow: 4.5 Power: 4.0 w PI: 3.0  Alarms: none Events: on batteries  Hematocrit: 32 Fixed speed: 5400 Low speed limit: 5100  Drive Line:  Existing VAD dressing clean, dry, and intact. Anchor correctly applied. Continue twice a week dressing changes on Monday/Thursday. Next dressing change due 10/16/22 by VAD coordinator or nurse champion only or Tucker.   Labs:  LDH trend: 596>441>165>384>345>312>312>350>327>309>316>305>298>277>273>298>280>285>251>252>205>193>202  INR trend: 1.4>1.6>1.1>1.1>1.3>1.2>1>1.3>1.4>1.5>1.6>2.8>2.8>2.3>2.2>2.3>2.6>2.6>1.7>2>1.8>1.7>2.1>1.8>2.4>2.3>2.0>1.8  AST/ALT trend: 218/45>96/30>73/23>63/36>58/37>69/50>93/72>123/103>143/150>117/138>119/138>92/127>87/123>50/71>46/65>46/43  Total Bili trend: 4.4>4.6>5.1>1.8>1.9>1.2>1.0>1.5>1.1>0.8>0.9>1.0>0.8  WBC trend: 11.6>13.7>19.6>16.1>23>26.6>21>24.6>18.1>16.5>13.2>13.1>12.5>11.6>10.5>10.8>13.1>9.5>9.1>9.7>7.8>6.7>6.7>6.4  Anticoagulation Plan: -INR Goal: 2.0 - 2.5 -ASA Dose: 81 mg  - Coumadin dosing per pharmacy  Blood Products:  IntraOp 8/14: - 4 FFP - 2 Platelets - 2 PRBC - 1 cyro - 449 cc of cellsaver - DDAVP 20 mcg x 1   Device: [employer]tronic  BiV -Therapies: ON VF ON >207 BPM FVT ON 176-207 BPM VT ON 171-207 BPM  Arrythmias:   Respiratory:   Infection:  09/01/22>> sputum cx>> NGTD>>final 09/01/22>> blood cx>> NGTD>>final 09/01/22>> urine cx>> NGTD>> final 09/13/22>>blood cxs>>no growth 5 days; final 09/22/22>>blood cxs>>  Enterococcus faecalis  09/23/22>> blood cxs>> neg final 09/24/22>> blood cxs>> neg final  Renal:  9/23:7/0.71  Adverse Events on VAD: - 08/30/22: - Developed left sided weakness this am. CTA with R MCA infarct. Taken to IR for thrombectomy   Patient Education: Discharge teaching completed with pt's [name] on 9/5. See separate note for documentation.   Plan/Recommendations:  Please page VAD coordinator for any alarms or VAD equipment issues. Continue twice a week dressing changes on Monday/Thursday by VAD coordinator, Nurse Alla Feeling, or [name].  [name] RN VAD Coordinator  Office: [phone]  24/7 Pager: [phone]

## 2022-10-13 NOTE — Progress Notes (Signed)
PROGRESS NOTE   Subjective/Complaints:  Had a HA right temporal yesterday evening , no HA this am   ROS: Patient denies CP, SOB, N/V/D  Objective:   No results found. Recent Labs    10/11/22 0436  WBC 6.4  HGB 9.2*  HCT 32.4*  PLT 243     Recent Labs    10/11/22 0436  NA 139  K 3.8  CL 103  CO2 26  GLUCOSE 64*  BUN 11  CREATININE 0.58*  CALCIUM 8.5*    Intake/Output Summary (Last 24 hours) at 10/13/2022 0844 Last data filed at 10/13/2022 0758 Gross per 24 hour  Intake 1340 ml  Output 800 ml  Net 540 ml        Physical Exam: Vital Signs Blood pressure 93/81, pulse 67, temperature 98.7 F (37.1 C), temperature source Oral, resp. rate 20, height 5\' 7"  (1.702 m), weight 75.5 kg, SpO2 97%.    General: No acute distress Mood and affect are appropriate Heart: LVAD hum  Lungs: Clear to auscultation, breathing unlabored, no rales or wheezes Abdomen: Positive bowel sounds, soft nontender to palpation, nondistended Extremities: No clubbing, cyanosis, or edema Skin: No evidence of breakdown, no evidence of rash  Neuro:  Alert and oriented x 3.   Normal language and speech. Cranial nerve exam unremarkable except for mild left central 7. MMT: LUE delt, bi, tri, grip   3/5 prox to distal. LLE 4-/5 throughout. Pt with intact  LT sense on left but feels a little different compared to R side . No abnl resting tone.  Musculoskeletal:no  pain with AROM in all 4 ext    Assessment/Plan: 1. Functional deficits which require 3+ hours per day of interdisciplinary therapy in a comprehensive inpatient rehab setting. Physiatrist is providing close team supervision and 24 hour management of active medical problems listed below. Physiatrist and rehab team continue to assess barriers to discharge/monitor patient progress toward functional and medical goals  Care Tool:  Bathing    Body parts bathed by patient: Chest, Left  arm, Abdomen, Front perineal area, Face, Left upper leg   Body parts bathed by helper: Right arm, Buttocks, Left lower leg, Right lower leg     Bathing assist Assist Level: Moderate Assistance - Patient 50 - 74%     Upper Body Dressing/Undressing Upper body dressing   What is the patient wearing?: Pull over shirt    Upper body assist Assist Level: Moderate Assistance - Patient 50 - 74%    Lower Body Dressing/Undressing Lower body dressing      What is the patient wearing?: Pants     Lower body assist Assist for lower body dressing: Moderate Assistance - Patient 50 - 74%     Toileting Toileting    Toileting assist Assist for toileting: Minimal Assistance - Patient > 75%     Transfers Chair/bed transfer  Transfers assist  Chair/bed transfer activity did not occur: Safety/medical concerns (fatigue)  Chair/bed transfer assist level: Supervision/Verbal cueing     Locomotion Ambulation   Ambulation assist   Ambulation activity did not occur: Safety/medical concerns (fatigue)  Assist level: Contact Guard/Touching assist Assistive device: Walker-rolling Max distance: 150'   Walk  10 feet activity   Assist  Walk 10 feet activity did not occur: Safety/medical concerns (fatigue)  Assist level: Contact Guard/Touching assist Assistive device: Walker-rolling   Walk 50 feet activity   Assist Walk 50 feet with 2 turns activity did not occur: Safety/medical concerns  Assist level: Contact Guard/Touching assist Assistive device: Walker-rolling    Walk 150 feet activity   Assist Walk 150 feet activity did not occur: Safety/medical concerns  Assist level: Contact Guard/Touching assist Assistive device: Walker-rolling    Walk 10 feet on uneven surface  activity   Assist Walk 10 feet on uneven surfaces activity did not occur: Safety/medical concerns (fatigue)         Wheelchair     Assist Is the patient using a wheelchair?: Yes Type of  Wheelchair: Manual Wheelchair activity did not occur: Safety/medical concerns (fatigue)         Wheelchair 50 feet with 2 turns activity    Assist    Wheelchair 50 feet with 2 turns activity did not occur: Safety/medical concerns       Wheelchair 150 feet activity     Assist  Wheelchair 150 feet activity did not occur: Safety/medical concerns       Blood pressure 93/81, pulse 67, temperature 98.7 F (37.1 C), temperature source Oral, resp. rate 20, height 5\' 7"  (1.702 m), weight 75.5 kg, SpO2 97%.  Medical Problem List and Plan: 1. Functional deficits secondary to acute right MCA infarct/ LVAD pt             -patient may shower             -ELOS/Goals: modI/S 10/17/22   -Continue CIR therapies including PT, OT, and SLP    2.  Antithrombotics: -DVT/anticoagulation:  Pharmaceutical: Coumadin per pharmacy -9/24 INR up to 2.4 , 9/26 INR 2.0              -antiplatelet therapy: Aspirin 81 mg daily   3. Pain Management: Tylenol and Tramadol as needed   -LUQ Is due to LVAD driveline--reposition self as possible when needed. 4. Mood/Behavior/Sleep: delirium/insomnia - team to provide ongoing ego support             -continue mirtazapine 7.5 mg q HS             -continue sertraline 50 mg daily             -continue melatonin 3 mg q HS -Started Trazodone 50mg  at bedtime on 09/29/22             -antipsychotic agents: n/a -9/13- mood somewhat better than when saw pt Tuesday- con't regimen and monitor -09/30/22 not sleeping well, increased Melatonin to 5mg  QHS; if not helping, may consider restarting mirtazapine as above 9/18 poor sleep related to freq urination related to lasix , intake of fluids is minimal may need to use comp hose to prvent accumulation during the day and prevent nocturnal diuresis  -10/07/22 mirtazepine restarted 7.5mg  QHS -- improved sleep   5. Neuropsych/cognition: This patient is capable of making decisions on his own behalf.   6. Skin/Wound Care:  Routine skin care checks   7. Fluids/Electrolytes/Nutrition: strict Is and Os and follow-up chemistries             -poor PO intake on Marinol             -continue supplements   -9/12 labs reviewed. low albumin still continue interventions above -9/24 labs stable, daily labs per HF team-->MWF  8: Hypotension but difficult to assess due to LVAD    9/13- BP soft, but denies orthostatic hypotension -9/14-15/24 BPs generally stable/improving; was previously on midodrine, but per HF team pt was stable off it;  Difficult to measure BP due to LVAD as well as LUE edema post CVA -9/24 bp's better off of lasix Vitals:   10/10/22 1932 10/11/22 0010 10/11/22 0412 10/11/22 0800  BP: 102/77 107/83 108/80 107/85   10/11/22 1543 10/11/22 2015 10/12/22 0000 10/12/22 1108  BP: 92/60 110/83 (!) 104/92 105/76   10/12/22 1541 10/12/22 1930 10/13/22 0000 10/13/22 0438  BP: 104/75 (!) 87/58 (!) 125/103 93/81   Systolic mainly in 100s now which is better for CVD  9: Hyperlipidemia: continue atorvastatin 80mg  daily   10: Enterococcus bacteremia: 6 weeks IV antibiotic, ceftriaxone and ampicillin last dose 10/19 will go home with PICC and HH             -follow-up with ID  -09/30/22 PICC placed   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: Home meds: Coreg 3.125, Lasix 40mg , Lostartan 25mg , Aldactone 12.5, Farxiga 10mg , mexilitene --holding all currently   12: Acute on chronic systolic heart failure with cardiogenic shock s/p LVAD             -daily weights balanced so far             -continue digoxin 0.125 mg daily             -continue mexiletine 250 mg BID -continue midodrine 5 mg q 8 hours--not ordered, per HF team on 9/13 pt was stable off Midodrine; hold for now             -continue sildenafil 20 mg q 8 hours             -continue warfarin per pharmacy Managed by Heart Failure team - coordinating with primary service for activity tolerance monitoring  -10/11/22 wt down today ?technique Filed Weights   10/10/22 0451  10/11/22 0700 10/12/22 1800  Weight: 77.1 kg 72.6 kg 75.5 kg    13: Hx of VT: continue mexiletine, digoxin; has ICD   14: DM-2: A1c = 5.5%; monitor PO intake; DC SSI (Farxiga at home) .cbg  15: OSA: needs outpatient sleep study   16: Anemia, iron deficiency: follow-up CBC--most recent hgb 9.5--lab pending still today?  -10/11/22 Hgb stable 9.3, monitor    Latest Ref Rng & Units 10/11/2022    4:36 AM 10/10/2022    4:12 AM 10/09/2022    3:36 AM  CBC  WBC 4.0 - 10.5 K/uL 6.4  6.7  6.7   Hemoglobin 13.0 - 17.0 g/dL 9.2  9.3  9.2   Hematocrit 39.0 - 52.0 % 32.4  32.5  31.6   Platelets 150 - 400 K/uL 243  254  253       17: Severe COPD:              -continue Ellipta one puff daily   18: S/p tooth extractions this admission: monitor PO tolerance   19: Glaucoma: continue Cosopt and Xalatan   20.  Candidiasis in groin start nystatin powder   21.  Dysuria- UA neg , on broad spectrum IV abx for bacteremia-- resolved 10/07/22  22.  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   - pt on warfarin which is now therapeutic.On pharmacy protocol   LOS: 16 days A FACE TO FACE EVALUATION WAS PERFORMED  Erick Colace 10/13/2022, 8:44 AM

## 2022-10-14 LAB — LACTATE DEHYDROGENASE: LDH: 221 U/L — ABNORMAL HIGH (ref 98–192)

## 2022-10-14 LAB — PROTIME-INR
INR: 1.7 — ABNORMAL HIGH (ref 0.8–1.2)
Prothrombin Time: 20.6 s — ABNORMAL HIGH (ref 11.4–15.2)

## 2022-10-14 LAB — MAGNESIUM: Magnesium: 1.6 mg/dL — ABNORMAL LOW (ref 1.7–2.4)

## 2022-10-14 MED ORDER — WARFARIN SODIUM 2.5 MG PO TABS
5.0000 mg | ORAL_TABLET | Freq: Once | ORAL | Status: AC
  Administered 2022-10-14: 5 mg via ORAL
  Filled 2022-10-14: qty 2

## 2022-10-14 NOTE — Progress Notes (Signed)
PROGRESS NOTE   Subjective/Complaints:  Pt doing alright, slept great, pain well managed, LBM yesterday, urinating fine, denies any other complaints or concerns today.   ROS: Patient denies CP, SOB, N/V/D/C  Objective:   No results found. No results for input(s): "WBC", "HGB", "HCT", "PLT" in the last 72 hours.    Recent Labs    10/13/22 1806  NA 140  K 4.0  CL 105  CO2 26  GLUCOSE 112*  BUN 7  CREATININE 0.90  CALCIUM 8.4*    Intake/Output Summary (Last 24 hours) at 10/14/2022 1203 Last data filed at 10/14/2022 1052 Gross per 24 hour  Intake 956 ml  Output 1400 ml  Net -444 ml        Physical Exam: Vital Signs Blood pressure 106/69, pulse 67, temperature 98.5 F (36.9 C), resp. rate 18, height 5\' 7"  (1.702 m), weight 76.7 kg, SpO2 98%.    General: No acute distress, looks better than prior weekends Mood and affect are appropriate Heart: LVAD hum  Lungs: Clear to auscultation, breathing unlabored, no rales or wheezes Abdomen: Positive bowel sounds, soft nontender to palpation, nondistended Extremities: No clubbing or cyanosis, very trace BLE edema Skin: No evidence of breakdown, no evidence of rash, well healing surgical incisions to chest/abd  Neuro:  Alert and oriented x 3.   Normal language and speech. Cranial nerve exam unremarkable except for mild left central 7. MMT: LUE delt, bi, tri, grip   3/5 prox to distal. LLE 4-/5 throughout. Pt with intact  LT sense on left but feels a little different compared to R side . No abnl resting tone.  Musculoskeletal:no  pain with AROM in all 4 ext    Assessment/Plan: 1. Functional deficits which require 3+ hours per day of interdisciplinary therapy in a comprehensive inpatient rehab setting. Physiatrist is providing close team supervision and 24 hour management of active medical problems listed below. Physiatrist and rehab team continue to assess barriers to  discharge/monitor patient progress toward functional and medical goals  Care Tool:  Bathing    Body parts bathed by patient: Chest, Left arm, Abdomen, Front perineal area, Face, Left upper leg   Body parts bathed by helper: Right arm, Buttocks, Left lower leg, Right lower leg     Bathing assist Assist Level: Moderate Assistance - Patient 50 - 74%     Upper Body Dressing/Undressing Upper body dressing   What is the patient wearing?: Pull over shirt    Upper body assist Assist Level: Moderate Assistance - Patient 50 - 74%    Lower Body Dressing/Undressing Lower body dressing      What is the patient wearing?: Pants     Lower body assist Assist for lower body dressing: Moderate Assistance - Patient 50 - 74%     Toileting Toileting    Toileting assist Assist for toileting: Minimal Assistance - Patient > 75%     Transfers Chair/bed transfer  Transfers assist  Chair/bed transfer activity did not occur: Safety/medical concerns (fatigue)  Chair/bed transfer assist level: Supervision/Verbal cueing     Locomotion Ambulation   Ambulation assist   Ambulation activity did not occur: Safety/medical concerns (fatigue)  Assist level: Contact  Guard/Touching assist Assistive device: Walker-rolling Max distance: 150'   Walk 10 feet activity   Assist  Walk 10 feet activity did not occur: Safety/medical concerns (fatigue)  Assist level: Contact Guard/Touching assist Assistive device: Walker-rolling   Walk 50 feet activity   Assist Walk 50 feet with 2 turns activity did not occur: Safety/medical concerns  Assist level: Contact Guard/Touching assist Assistive device: Walker-rolling    Walk 150 feet activity   Assist Walk 150 feet activity did not occur: Safety/medical concerns  Assist level: Contact Guard/Touching assist Assistive device: Walker-rolling    Walk 10 feet on uneven surface  activity   Assist Walk 10 feet on uneven surfaces activity did  not occur: Safety/medical concerns (fatigue)         Wheelchair     Assist Is the patient using a wheelchair?: Yes Type of Wheelchair: Manual Wheelchair activity did not occur: Safety/medical concerns (fatigue)         Wheelchair 50 feet with 2 turns activity    Assist    Wheelchair 50 feet with 2 turns activity did not occur: Safety/medical concerns       Wheelchair 150 feet activity     Assist  Wheelchair 150 feet activity did not occur: Safety/medical concerns       Blood pressure 106/69, pulse 67, temperature 98.5 F (36.9 C), resp. rate 18, height 5\' 7"  (1.702 m), weight 76.7 kg, SpO2 98%.  Medical Problem List and Plan: 1. Functional deficits secondary to acute right MCA infarct/ LVAD pt             -patient may shower             -ELOS/Goals: modI/S 10/17/22   -Continue CIR therapies including PT, OT, and SLP    2.  Antithrombotics: -DVT/anticoagulation:  Pharmaceutical: Coumadin per pharmacy -9/24 INR up to 2.4 , 9/26 INR 2.0, 10/14/22 INR 1.7              -antiplatelet therapy: Aspirin 81 mg daily   3. Pain Management: Tylenol and Tramadol as needed   -LUQ Is due to LVAD driveline--reposition self as possible when needed. 4. Mood/Behavior/Sleep: delirium/insomnia - team to provide ongoing ego support             -continue mirtazapine 7.5 mg q HS             -continue sertraline 50 mg daily             -continue melatonin 3 mg q HS -Started Trazodone 50mg  at bedtime on 09/29/22             -antipsychotic agents: n/a -9/13- mood somewhat better than when saw pt Tuesday- con't regimen and monitor -09/30/22 not sleeping well, increased Melatonin to 5mg  QHS; if not helping, may consider restarting mirtazapine as above 9/18 poor sleep related to freq urination related to lasix , intake of fluids is minimal may need to use comp hose to prvent accumulation during the day and prevent nocturnal diuresis  -10/07/22 mirtazepine restarted 7.5mg  QHS --  improved sleep   -10/14/22 trazodone, mirtazepine, and melatonin now PRN, pt sleeping better without them, cont regimen 5. Neuropsych/cognition: This patient is capable of making decisions on his own behalf.   6. Skin/Wound Care: Routine skin care checks   7. Fluids/Electrolytes/Nutrition: strict Is and Os and follow-up chemistries             -poor PO intake on Marinol             -  continue supplements   -9/12 labs reviewed. low albumin still continue interventions above -9/24 labs stable, daily labs per HF team-->MWF  8: Hypotension but difficult to assess due to LVAD    9/13- BP soft, but denies orthostatic hypotension -9/14-15/24 BPs generally stable/improving; was previously on midodrine, but per HF team pt was stable off it;  Difficult to measure BP due to LVAD as well as LUE edema post CVA -9/24 bp's better off of lasix  Systolic mainly in 100s now which is better for CVD -10/14/22 BPs fairly stable; monitor Vitals:   10/11/22 0800 10/11/22 1543 10/11/22 2015 10/12/22 0000  BP: 107/85 92/60 110/83 (!) 104/92   10/12/22 1108 10/12/22 1541 10/12/22 1930 10/13/22 0000  BP: 105/76 104/75 (!) 87/58 (!) 125/103   10/13/22 0438 10/13/22 1321 10/13/22 1939 10/14/22 0507  BP: 93/81 97/81 (!) 86/75 106/69    9: Hyperlipidemia: continue atorvastatin 80mg  daily   10: Enterococcus bacteremia: 6 weeks IV antibiotic, ceftriaxone and ampicillin last dose 10/19 will go home with PICC and HH             -follow-up with ID  -09/30/22 PICC placed   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: Home meds: Coreg 3.125, Lasix 40mg , Lostartan 25mg , Aldactone 12.5, Farxiga 10mg , mexilitene --holding all currently   12: Acute on chronic systolic heart failure with cardiogenic shock s/p LVAD             -daily weights balanced so far             -continue digoxin 0.125 mg daily             -continue mexiletine 250 mg BID -continue midodrine 5 mg q 8 hours--not ordered, per HF team on 9/13 pt was stable off Midodrine;  hold for now             -continue sildenafil 20 mg q 8 hours             -continue warfarin per pharmacy Managed by Heart Failure team - coordinating with primary service for activity tolerance monitoring  -10/11/22 wt down today ?technique -10/14/22 wt stable Filed Weights   10/12/22 1800 10/13/22 1217 10/14/22 0500  Weight: 75.5 kg 75.8 kg 76.7 kg    13: Hx of VT: continue mexiletine, digoxin; has ICD   14: DM-2: A1c = 5.5%; monitor PO intake; DC SSI (Farxiga at home)   15: OSA: needs outpatient sleep study   16: Anemia, iron deficiency: follow-up CBC--most recent hgb 9.5--lab pending still today?  -10/11/22 Hgb stable 9.3, monitor    Latest Ref Rng & Units 10/11/2022    4:36 AM 10/10/2022    4:12 AM 10/09/2022    3:36 AM  CBC  WBC 4.0 - 10.5 K/uL 6.4  6.7  6.7   Hemoglobin 13.0 - 17.0 g/dL 9.2  9.3  9.2   Hematocrit 39.0 - 52.0 % 32.4  32.5  31.6   Platelets 150 - 400 K/uL 243  254  253       17: Severe COPD:              -continue Ellipta one puff daily   18: S/p tooth extractions this admission: monitor PO tolerance   19: Glaucoma: continue Cosopt and Xalatan   20.  Candidiasis in groin start nystatin powder   21.  Dysuria- UA neg , on broad spectrum IV abx for bacteremia-- resolved 10/07/22  22.  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   - pt on warfarin which is now therapeutic.On pharmacy protocol   LOS: 17 days A FACE TO FACE EVALUATION WAS PERFORMED  [id number] Kauri Garson 10/14/2022, 12:03 PM

## 2022-10-14 NOTE — Progress Notes (Signed)
Physical Therapy Weekly Progress Note  Patient Details  Name: Charles Holmes MRN: [id number] Date of Birth: 01/21/1965  Beginning of progress report period: October 07, 2022 End of progress report period: October 14, 2022  Patient has met 3 of 3 short term goals. Pt has made functional progress towards LTG by meeting 3/3 STG. Pt currently is close supervision with sit to stands from all surfaces (pt still requires VC for sequence after failing attempts to stand due to not being scooted towards edge enough). PT also is at the level of supervision for ambulation with IV pole or no AD (can ambulated more than 100' without rest break). Pt has met goal of supervision with bed mobility with HOB elevated due to pt not being able to tolerate laying flat from poor lung endurance. Pt has decreased L lean from evaluation, and has shown decreased instances of LOB.   Patient continues to demonstrate the following deficits {impairments:3041632} and therefore will continue to benefit from skilled PT intervention to increase functional independence with mobility.  Patient {LTG progression:3041653}.  {plan of YNWG:9562130}  PT Short Term Goals Week 2:  PT Short Term Goal 1 (Week 2): Pt will require consistent supervision for bed mobility. PT Short Term Goal 1 - Progress (Week 2): Met PT Short Term Goal 2 (Week 2): Pt will require consistent CGA for sit to stand with LRAD. PT Short Term Goal 2 - Progress (Week 2): Met PT Short Term Goal 3 (Week 2): Pt will require supervision for ambulation >100 ft with LRAD and no rest breaks. PT Short Term Goal 3 - Progress (Week 2): Met      Therapy Documentation Precautions:  Precautions Precautions: Sternal, Fall, Other (comment) Precaution Comments: LVAD Restrictions Weight Bearing Restrictions: No RUE Weight Bearing:  (sternal precaution) LUE Weight Bearing:  (sternal precaution) RLE Weight Bearing: Weight bearing as tolerated Other Position/Activity  Restrictions: Sternal Precautions  Zafir Schauer PTA  10/14/2022, 3:41 PM

## 2022-10-14 NOTE — Progress Notes (Signed)
Physical Therapy Session Note  Patient Details  Name: ELL TISO MRN: [id number] Date of Birth: 01-03-1966  Today's Date: 10/14/2022     Short Term Goals: Week 2:  PT Short Term Goal 1 (Week 2): Pt will require consistent supervision for bed mobility. PT Short Term Goal 2 (Week 2): Pt will require consistent CGA for sit to stand with LRAD. PT Short Term Goal 3 (Week 2): Pt will require supervision for ambulation >100 ft with LRAD and no rest breaks.  PTA attempted to make up pt's missed minutes this day but pt denied PT services due to fatigue. Will attempt at a later time as schedule allows.     Therapy Documentation Precautions:  Precautions Precautions: Sternal, Fall, Other (comment) Precaution Comments: LVAD Restrictions Weight Bearing Restrictions: No RUE Weight Bearing:  (sternal precaution) LUE Weight Bearing:  (sternal precaution) RLE Weight Bearing: Weight bearing as tolerated Other Position/Activity Restrictions: Sternal Precautions  Therapy/Group: Individual Therapy  Esli Jernigan PTA 10/14/2022, 3:02 PM

## 2022-10-14 NOTE — Progress Notes (Signed)
Occupational Therapy Session Note  Patient Details  Name: Charles Holmes MRN: [id number] Date of Birth: [birth year]  Today's Date: 10/14/2022 OT Individual Time: 8413-2440 OT Individual Time Calculation (min): 43 min    Short Term Goals: Week 3:  OT Short Term Goal 1 (Week 3): STGs = LTGs  Skilled Therapeutic Interventions/Progress Updates: Patient received resting in bed. Agreeable to OT treatment. Patient requesting to wash hair. Assisted OOB to w/c with CGA. Sat up at the sink for assist washing hair. Patient able to comb hair without assist. Followed with FM neuro reeducation sitting at bedside table. Started with joint mobilization and light retrograde massage. Followed with putty exercises working on pinch strength and lumbrical pinch and finger extension. Continued with FM in hand manipulation working on picking up 1 inch beads and placing them into a slot. Patient reports continued numbness in finger tips adding to the difficulty of the activity. Patient with good attention and motivation throughout L hand FM manipulation tasks. Following treatment patient requesting to get back in bed. Returned to bed from w/c with CGA. Continue with skilled OT to focus on self care independence and the return of LUE function.       Therapy Documentation Precautions:  Precautions Precautions: Sternal, Fall, Other (comment) Precaution Comments: LVAD Restrictions Weight Bearing Restrictions: No RUE Weight Bearing:  (sternal precaution) LUE Weight Bearing:  (sternal precaution) RLE Weight Bearing: Weight bearing as tolerated Other Position/Activity Restrictions: Sternal Precautions General:   Vital Signs: Oxygen Therapy O2 Device: Room Air Pain: Pain Assessment Pain Scale: 0-10 Pain Score: 0-No pain ADL: ADL Eating: Set up Where Assessed-Eating: Edge of bed Grooming: Setup Where Assessed-Grooming: Standing at sink Upper Body Bathing: Minimal assistance Where Assessed-Upper Body  Bathing: Edge of bed Lower Body Bathing: Moderate assistance Where Assessed-Lower Body Bathing: Edge of bed Upper Body Dressing: Moderate assistance Where Assessed-Upper Body Dressing: Edge of bed Lower Body Dressing: Moderate assistance Where Assessed-Lower Body Dressing: Edge of bed Toileting: Minimal assistance Where Assessed-Toileting: Teacher, adult education: Close supervision Toilet Transfer Method: Proofreader: Engineer, technical sales: Not assessed Film/video editor: Not assessed     Therapy/Group: Individual Therapy  Warnell Forester 10/14/2022, 12:12 PM

## 2022-10-14 NOTE — Progress Notes (Signed)
ANTICOAGULATION CONSULT NOTE - Follow-up  Pharmacy Consult for warfarin Indication:  LVAD  Allergies  Allergen Reactions   Pacerone [Amiodarone] Other (See Comments)    Severe tremors   Percocet [Oxycodone-Acetaminophen] Itching    Patient Measurements: Height: 5\' 7"  (170.2 cm) Weight: 76.7 kg (169 lb 1.5 oz) IBW/kg (Calculated) : 66.1 Heparin Dosing Weight: 87kg  Vital Signs: Temp: 98.5 F (36.9 C) (09/28 0507) BP: 106/69 (09/28 0507) Pulse Rate: 67 (09/28 0507)  Labs: Recent Labs    10/12/22 0305 10/13/22 0348 10/13/22 1806 10/14/22 0359  LABPROT 22.6* 21.1*  --  20.6*  INR 2.0* 1.8*  --  1.7*  CREATININE  --   --  0.90  --     Estimated Creatinine Clearance: 82.6 mL/min (by C-G formula based on SCr of 0.9 mg/dL).   Medical History: Past Medical History:  Diagnosis Date   CAD (coronary artery disease)    CHF (congestive heart failure) (HCC)    COPD (chronic obstructive pulmonary disease) (HCC)    GERD (gastroesophageal reflux disease)    Hyperlipidemia    Hypertension    LVAD (left ventricular assist device) present (HCC)    Stroke (HCC)    Systolic heart failure (HCC) 2021   LVEF 18%, RVEF 38% on cardiac MRI 12/19/2019. possible cardiac sarcoidosis.   Wide-complex tachycardia 2021   wears LifeVest     Assessment: 57 yoM on apixaban PTA for hx AF admitted for LVAD workup. Pt s/p HM3 implant on 8/13 c/b acute CVA postop, on aspirin 81 mg daily. Started low dose heparin + warfarin started 8/19. Low-dose heparin was stopped 8/31.  INR 1.7 today (-0.1). No signs or symptoms of bleeding. CBC and LDH stable. 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  Goal of Therapy:  INR 2-2.5 Monitor platelets by anticoagulation protocol: Yes   Plan:  Repeat Warfarin 5mg  x1 Monitor daily INR, CBC  Thank you Okey Regal, PharmD 10/14/2022

## 2022-10-14 NOTE — Progress Notes (Signed)
Speech Language Pathology Daily Session Note  Patient Details  Name: Charles Holmes MRN: [id number] Date of Birth: Apr 01, 1965  Today's Date: 10/14/2022 SLP Individual Time: 0900-1000 SLP Individual Time Calculation (min): 60 min  Short Term Goals: Week 3: SLP Short Term Goal 1 (Week 3): STGs=LTGs due to ELOS  Skilled Therapeutic Interventions: Skilled therapy session focused on speech intelligibility and cognitive goals. SLP addressed speech intelligibility through review of strategies. Patient able to recall strategies with supervision A and utilize strategies during conversation to increase intelligibility to 100%. SLP addressed cognition through complex word description task. Patient was encouraged to describe word without using common words/phrases associated with it. Patient able to complete with supervision A. At the end of the session, patient requested to use the bathroom. SLP notifed nursing. Patient left in bed with alarm set and call bell in reach. Continue POC.   Pain Pain Assessment Pain Scale: 0-10 Pain Score: 0-No pain  Therapy/Group: Individual Therapy  Saragrace Selke M.A., CF-SLP 10/14/2022, 10:10 AM

## 2022-10-14 NOTE — Progress Notes (Signed)
Patient ID: Charles Holmes, male   DOB: December 14, 1965, 57 y.o.   MRN: [id number]     Advanced Heart Failure Rounding Note  PCP-Cardiologist: Norman Herrlich, MD  [facility]: Dr. Gala Romney   Subjective:    Admitted to CIR on 09/11  INR 1.7 today; left hand continues to improve. Euvolemic, no SOB.   LVAD Interrogation HM III: Speed: 5400 Flow: 4.6 PI: 2.9  Power: 4. Assess working with PT.    aspirin EC  81 mg Oral Daily   atorvastatin  80 mg Oral Daily   Chlorhexidine Gluconate Cloth  6 each Topical BID   digoxin  0.125 mg Oral Daily   dorzolamide-timolol  1 drop Both Eyes BID   Gerhardt's butt cream   Topical TID   influenza vac split trivalent PF  0.5 mL Intramuscular Tomorrow-1000   latanoprost  1 drop Both Eyes QHS   mexiletine  250 mg Oral BID   mometasone-formoterol  2 puff Inhalation BID   multivitamin with minerals  1 tablet Oral Daily   nystatin   Topical BID   pantoprazole  40 mg Oral Daily   sertraline  50 mg Oral Daily   sildenafil  20 mg Oral TID   spironolactone  25 mg Oral Daily   umeclidinium bromide  1 puff Inhalation Daily   warfarin  5 mg Oral ONCE-1600   Warfarin - Pharmacist Dosing Inpatient   Does not apply q1600    Objective:   Weight Range: 76.7 kg Body mass index is 26.48 kg/m.   Vital Signs:   Temp:  [98.5 F (36.9 C)-98.7 F (37.1 C)] 98.7 F (37.1 C) (09/28 1353) Pulse Rate:  [67-86] 68 (09/28 1353) Resp:  [17-18] 17 (09/28 1353) BP: (86-106)/(58-75) 92/58 (09/28 1353) SpO2:  [94 %-98 %] 95 % (09/28 1353) Weight:  [76.7 kg] 76.7 kg (09/28 0500) Last BM Date : 10/13/22  Weight change: Filed Weights   10/12/22 1800 10/13/22 1217 10/14/22 0500  Weight: 75.5 kg 75.8 kg 76.7 kg   Intake/Output:   Intake/Output Summary (Last 24 hours) at 10/14/2022 1407 Last data filed at 10/14/2022 1406 Gross per 24 hour  Intake 957 ml  Output 1550 ml  Net -593 ml     Physical Exam  Physical Exam: GENERAL: No acute distress. Walking with therapy   HEENT: normal  NECK: Supple, JVP 5  .  2+ bilaterally, no bruits.  No lymphadenopathy or thyromegaly appreciated.   CARDIAC:  Mechanical heart sounds with LVAD hum present.  LUNGS:  Clear to auscultation bilaterally.  ABDOMEN:  Soft, round, nontender, positive bowel sounds x4.     LVAD exit site: well-healed and incorporated.  Dressing dry and intact.  No erythema or drainage.  Stabilization device present and accurately applied.   EXTREMITIES:  Warm and dry, no cyanosis, clubbing, rash. Trace edema. RUE PICC  NEUROLOGIC:  Alert and oriented x 3.    No aphasia.  No dysarthria.  Affect pleasant.     Patient Profile  Charles Holmes is a 57 y.o. 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 Admitted with cardiogenic shock, stabilized and underwent HM3 LVAD. Post implant course c/b acute CVA and enterococcus faecalis bacteremia.   Assessment/Plan  1.  Acute on chronic Systolic HF-->Cardiogenic Shock  - Diagnosed 11/2019. Presented with VT. LHC 70% LAD  - cMRI 12/21 concerning for sarcoid and EF 18%.  - PET 2/22 at [facility] EF 25% + active sarcoid -  [employer] CRT-D upgrade in 06/08/21 - Echo 07/10/22: EF <20%, RV okay, mod pericardial effusion, mod Charles/TR - Admitted 07/25 with cardiogenic shock. - RHC: Nonobstructive CAD, severely elevated filling pressures and low Fick CO/CI (2.7/1.4) - 08/13 HM III LVAD implant + clipping LAA d/t severe thickening and invagination of mitral valve annulus impeding flows.  - Apical core sent - no mention of sarcoid - Echo 8/26 mod-sev RV dysfunction. Speed increased to 5400 8/26 - Ramp Echo 9/4, speed increased to 5400  - MAP 80s, stable.  Volume status ok.  - On Sildenafil 20 mg tid.  - Continue spironolactone 25mg  daily - Continue digoxin 0.125 mg daily - Labs stable; hopeful for discharge this week.   2. HM-3 LVAD - Assessed controller while in PT.  - LDH stable - Continue ASA 81 - INR 1.7   Dosing per PharmD - Caregiver education completed.   3.  Acute stroke - Hx CVA 06/24 -Admitted 06/24 w/ R MCA stroke. S/p TPA and mechanical clot extraction. No residual deficits. Likely cardioembolic in setting of severe LV dysfunction. - Developed left sided weakness 08/14. CTA with R MCA infarct. Taken to IR for thrombectomy - Repeat CT head with small to moderate size hemorrhagic conversion.  - repeat head CT on 8/17 w/ improved hemorrhagic CVA - On warfarin.  - Functional deficits improving. L hand getting a little stronger.  - Continue ASA 81 mg daily  - Appreciate CIR  4. Enterococcus faecalis bacteremia - Bcx 2/2 on 9/6 - ID consult 9/6 -> ampicillin and ceftriaxone - TEE recommended but doubt it will change management as VAD cannot be removed and will require IV abx followed by long-term suppression - Echo 09/10 - no obvious vegetations -  Repeat BCx 9/8 NGTD. - ID recommending 6 weeks IV amp/ceftriaxone followed by long-term suppression with amoxicillin. Stop date 11/04/22   5. Hx VT - ln setting of potential sarcoid heart disease  - Off amio due to tremor. Continue mexiletine, ICD in place   6. CAD - LHC 12/07/19 70-% LAD, no intervention - LHC 8/24 non obstructive CAD.  - Continue statin. On aspirin for VAD.  - No chest pain.   7. 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   8. 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 Iron deficiency anemia/ Post-op anemia - Has had IV Fe.    10. Pulmonary  - PFTs with severe obstructive defect, response to bronchodilator. FEV1 1.04L, FEV1/FVC 48% - Stable  11. Psych - Depression and delirium post VAD implant. Prolonged hospital course.  - Improving - Continue sertraline  12. Left arm swelling - 9/20 LUE  dopplers with chronic superficial vein thrombosis.    Marquese Burkland 2:09 PM

## 2022-10-15 LAB — PROTIME-INR
INR: 2 — ABNORMAL HIGH (ref 0.8–1.2)
Prothrombin Time: 22.7 s — ABNORMAL HIGH (ref 11.4–15.2)

## 2022-10-15 LAB — LACTATE DEHYDROGENASE: LDH: 184 U/L (ref 98–192)

## 2022-10-15 LAB — MAGNESIUM: Magnesium: 1.6 mg/dL — ABNORMAL LOW (ref 1.7–2.4)

## 2022-10-15 MED ORDER — WARFARIN SODIUM 3 MG PO TABS
3.0000 mg | ORAL_TABLET | Freq: Once | ORAL | Status: AC
  Administered 2022-10-15: 3 mg via ORAL
  Filled 2022-10-15: qty 1

## 2022-10-15 NOTE — Progress Notes (Signed)
PROGRESS NOTE   Subjective/Complaints:  Pt doing well, slept well, pain well managed (none currently), LBM this morning, urinating fine, denies any other complaints or concerns today.   ROS: Patient denies CP, SOB, N/V/D/C  Objective:   No results found. No results for input(s): "WBC", "HGB", "HCT", "PLT" in the last 72 hours.    Recent Labs    10/13/22 1806  NA 140  K 4.0  CL 105  CO2 26  GLUCOSE 112*  BUN 7  CREATININE 0.90  CALCIUM 8.4*    Intake/Output Summary (Last 24 hours) at 10/15/2022 1030 Last data filed at 10/15/2022 0845 Gross per 24 hour  Intake 717 ml  Output 1925 ml  Net -1208 ml        Physical Exam: Vital Signs Blood pressure (!) 109/97, pulse 65, temperature 98.1 F (36.7 C), resp. rate 18, height 5\' 7"  (1.702 m), weight 78 kg, SpO2 98%.    General: No acute distress, looks great Mood and affect are appropriate Heart: LVAD hum  Lungs: Clear to auscultation, breathing unlabored, no rales or wheezes Abdomen: Positive bowel sounds, soft nontender to palpation, nondistended Extremities: No clubbing or cyanosis, very trace BLE edema Skin: No evidence of breakdown, no evidence of rash, well healing surgical incisions to chest/abd  Neuro:  Alert and oriented x 3.   Normal language and speech. Cranial nerve exam unremarkable except for mild left central 7. MMT: LUE delt, bi, tri, grip   3/5 prox to distal. LLE 4-/5 throughout. Pt with intact  LT sense on left but feels a little different compared to R side . No abnl resting tone.  Musculoskeletal:no  pain with AROM in all 4 ext    Assessment/Plan: 1. Functional deficits which require 3+ hours per day of interdisciplinary therapy in a comprehensive inpatient rehab setting. Physiatrist is providing close team supervision and 24 hour management of active medical problems listed below. Physiatrist and rehab team continue to assess barriers to  discharge/monitor patient progress toward functional and medical goals  Care Tool:  Bathing    Body parts bathed by patient: Chest, Left arm, Abdomen, Front perineal area, Face, Left upper leg   Body parts bathed by helper: Right arm, Buttocks, Left lower leg, Right lower leg     Bathing assist Assist Level: Moderate Assistance - Patient 50 - 74%     Upper Body Dressing/Undressing Upper body dressing   What is the patient wearing?: Pull over shirt    Upper body assist Assist Level: Moderate Assistance - Patient 50 - 74%    Lower Body Dressing/Undressing Lower body dressing      What is the patient wearing?: Pants     Lower body assist Assist for lower body dressing: Moderate Assistance - Patient 50 - 74%     Toileting Toileting    Toileting assist Assist for toileting: Minimal Assistance - Patient > 75%     Transfers Chair/bed transfer  Transfers assist  Chair/bed transfer activity did not occur: Safety/medical concerns (fatigue)  Chair/bed transfer assist level: Supervision/Verbal cueing     Locomotion Ambulation   Ambulation assist   Ambulation activity did not occur: Safety/medical concerns (fatigue)  Assist level:  Supervision/Verbal cueing Assistive device: Other (comment) (IV pole) Max distance: 150'   Walk 10 feet activity   Assist  Walk 10 feet activity did not occur: Safety/medical concerns (fatigue)  Assist level: Supervision/Verbal cueing Assistive device: Other (comment) (IV pole)   Walk 50 feet activity   Assist Walk 50 feet with 2 turns activity did not occur: Safety/medical concerns  Assist level: Supervision/Verbal cueing Assistive device: Other (comment) (IV pole)    Walk 150 feet activity   Assist Walk 150 feet activity did not occur: Safety/medical concerns  Assist level: Supervision/Verbal cueing Assistive device:  (IV pole)    Walk 10 feet on uneven surface  activity   Assist Walk 10 feet on uneven surfaces  activity did not occur: Safety/medical concerns (fatigue)   Assist level: Supervision/Verbal cueing Assistive device:  (IV pole)   Wheelchair     Assist Is the patient using a wheelchair?: Yes Type of Wheelchair: Manual Wheelchair activity did not occur: Safety/medical concerns (fatigue)         Wheelchair 50 feet with 2 turns activity    Assist    Wheelchair 50 feet with 2 turns activity did not occur: Safety/medical concerns       Wheelchair 150 feet activity     Assist  Wheelchair 150 feet activity did not occur: Safety/medical concerns       Blood pressure (!) 109/97, pulse 65, temperature 98.1 F (36.7 C), resp. rate 18, height 5\' 7"  (1.702 m), weight 78 kg, SpO2 98%.  Medical Problem List and Plan: 1. Functional deficits secondary to acute right MCA infarct/ LVAD pt             -patient may shower             -ELOS/Goals: modI/S 10/17/22   -Continue CIR therapies including PT, OT, and SLP    2.  Antithrombotics: -DVT/anticoagulation:  Pharmaceutical: Coumadin per pharmacy -9/24 INR up to 2.4 , 9/26 INR 2.0, 10/14/22 INR 1.7, 10/15/22 INR 2.0              -antiplatelet therapy: Aspirin 81 mg daily   3. Pain Management: Tylenol and Tramadol as needed   -LUQ Is due to LVAD driveline--reposition self as possible when needed. 4. Mood/Behavior/Sleep: delirium/insomnia - team to provide ongoing ego support             -continue mirtazapine 7.5 mg q HS             -continue sertraline 50 mg daily             -continue melatonin 3 mg q HS -Started Trazodone 50mg  at bedtime on 09/29/22             -antipsychotic agents: n/a -9/13- mood somewhat better than when saw pt Tuesday- con't regimen and monitor -09/30/22 not sleeping well, increased Melatonin to 5mg  QHS; if not helping, may consider restarting mirtazapine as above 9/18 poor sleep related to freq urination related to lasix , intake of fluids is minimal may need to use comp hose to prvent accumulation  during the day and prevent nocturnal diuresis  -10/07/22 mirtazepine restarted 7.5mg  QHS -- improved sleep   -10/14/22 trazodone, mirtazepine, and melatonin now PRN, pt sleeping better without them, cont monitoring 5. Neuropsych/cognition: This patient is capable of making decisions on his own behalf.   6. Skin/Wound Care: Routine skin care checks   7. Fluids/Electrolytes/Nutrition: strict Is and Os and follow-up chemistries             -  poor PO intake on Marinol             -continue supplements   -9/12 labs reviewed. low albumin still continue interventions above -9/24 labs stable, daily labs per HF team-->MWF  8: Hypotension but difficult to assess due to LVAD    9/13- BP soft, but denies orthostatic hypotension -9/14-15/24 BPs generally stable/improving; was previously on midodrine, but per HF team pt was stable off it;  Difficult to measure BP due to LVAD as well as LUE edema post CVA -9/24 bp's better off of lasix  Systolic mainly in 100s now which is better for CVD -9/28-29/24 BPs fairly stable; monitor Vitals:   10/12/22 0000 10/12/22 1108 10/12/22 1541 10/12/22 1930  BP: (!) 104/92 105/76 104/75 (!) 87/58   10/13/22 0000 10/13/22 0438 10/13/22 1321 10/13/22 1939  BP: (!) 125/103 93/81 97/81 (!) 86/75   10/14/22 0507 10/14/22 1353 10/14/22 1923 10/15/22 0358  BP: 106/69 (!) 92/58 101/88 (!) 109/97    9: Hyperlipidemia: continue atorvastatin 80mg  daily   10: Enterococcus bacteremia: 6 weeks IV antibiotic, ceftriaxone and ampicillin last dose 10/19 will go home with PICC and HH             -follow-up with ID  -09/30/22 PICC placed   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: Home meds: Coreg 3.125, Lasix 40mg , Lostartan 25mg , Aldactone 12.5, Farxiga 10mg , mexilitene --holding all currently   12: Acute on chronic systolic heart failure with cardiogenic shock s/p LVAD             -daily weights balanced so far             -continue digoxin 0.125 mg daily             -continue mexiletine 250 mg  BID -continue midodrine 5 mg q 8 hours--not ordered, per HF team on 9/13 pt was stable off Midodrine; hold for now             -continue sildenafil 20 mg q 8 hours             -continue warfarin per pharmacy Managed by Heart Failure team - coordinating with primary service for activity tolerance monitoring  -10/11/22 wt down today ?technique -10/14/22 wt stable -10/15/22 wt a little up but suspect bed weight, edema actually improving Filed Weights   10/13/22 1217 10/14/22 0500 10/15/22 0353  Weight: 75.8 kg 76.7 kg 78 kg    13: Hx of VT: continue mexiletine, digoxin; has ICD   14: DM-2: A1c = 5.5%; monitor PO intake; DC SSI (Farxiga at home)   15: OSA: needs outpatient sleep study   16: Anemia, iron deficiency: follow-up CBC--most recent hgb 9.5--lab pending still today?  -10/11/22 Hgb stable 9.3, monitor    Latest Ref Rng & Units 10/11/2022    4:36 AM 10/10/2022    4:12 AM 10/09/2022    3:36 AM  CBC  WBC 4.0 - 10.5 K/uL 6.4  6.7  6.7   Hemoglobin 13.0 - 17.0 g/dL 9.2  9.3  9.2   Hematocrit 39.0 - 52.0 % 32.4  32.5  31.6   Platelets 150 - 400 K/uL 243  254  253       17: Severe COPD:              -continue Ellipta one puff daily   18: S/p tooth extractions this admission: monitor PO tolerance   19: Glaucoma: continue Cosopt and Xalatan   20.  Candidiasis in groin start nystatin powder  21.  Dysuria- UA neg , on broad spectrum IV abx for bacteremia-- resolved 10/07/22  22.  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   - pt on warfarin which is now therapeutic.On pharmacy protocol   LOS: 18 days A FACE TO FACE EVALUATION WAS PERFORMED  [address] 10/15/2022, 10:30 AM

## 2022-10-15 NOTE — Progress Notes (Signed)
ANTICOAGULATION CONSULT NOTE - Follow-up  Pharmacy Consult for warfarin Indication:  LVAD  Allergies  Allergen Reactions   Pacerone [Amiodarone] Other (See Comments)    Severe tremors   Percocet [Oxycodone-Acetaminophen] Itching    Patient Measurements: Height: 5\' 7"  (170.2 cm) Weight: 78 kg (171 lb 15.3 oz) IBW/kg (Calculated) : 66.1 Heparin Dosing Weight: 87kg  Vital Signs: Temp: 98.1 F (36.7 C) (09/29 0358) BP: 109/97 (09/29 0358) Pulse Rate: 65 (09/29 0358)  Labs: Recent Labs    10/13/22 0348 10/13/22 1806 10/14/22 0359 10/15/22 0343  LABPROT 21.1*  --  20.6* 22.7*  INR 1.8*  --  1.7* 2.0*  CREATININE  --  0.90  --   --     Estimated Creatinine Clearance: 82.6 mL/min (by C-G formula based on SCr of 0.9 mg/dL).   Medical History: Past Medical History:  Diagnosis Date   CAD (coronary artery disease)    CHF (congestive heart failure) (HCC)    COPD (chronic obstructive pulmonary disease) (HCC)    GERD (gastroesophageal reflux disease)    Hyperlipidemia    Hypertension    LVAD (left ventricular assist device) present (HCC)    Stroke (HCC)    Systolic heart failure (HCC) 2021   LVEF 18%, RVEF 38% on cardiac MRI 12/19/2019. possible cardiac sarcoidosis.   Wide-complex tachycardia 2021   wears LifeVest     Assessment: Charles Holmes on apixaban PTA for hx AF admitted for LVAD workup. Pt s/p HM3 implant on 8/13 c/b acute CVA postop, on aspirin 81 mg daily. Started low dose heparin + warfarin started 8/19. Low-dose heparin was stopped 8/31.  INR 2 today (+0.3). No signs or symptoms of bleeding.  Goal of Therapy:  INR 2-2.5 Monitor platelets by anticoagulation protocol: Yes   Plan:  Warfarin 3 mg pox 1 today Monitor daily INR, CBC  Thank you Okey Regal, PharmD 10/15/2022

## 2022-10-15 NOTE — Progress Notes (Signed)
Patient ID: FURMAN OBARR, male   DOB: August 28, 1965, 57 y.o.   MRN: [id number]     Advanced Heart Failure Rounding Note  PCP-Cardiologist: Norman Herrlich, MD  [facility]: Dr. Gala Romney   Subjective:    Admitted to CIR on 09/11  INR 2 today; left hand continues to improve. Euvolemic; no complaints today.   LVAD Interrogation HM III: Speed: 5400 Flow: 4.8 PI: 3  Power: 4. Assess working with PT.    aspirin EC  81 mg Oral Daily   atorvastatin  80 mg Oral Daily   Chlorhexidine Gluconate Cloth  6 each Topical BID   digoxin  0.125 mg Oral Daily   dorzolamide-timolol  1 drop Both Eyes BID   Gerhardt's butt cream   Topical TID   influenza vac split trivalent PF  0.5 mL Intramuscular Tomorrow-1000   latanoprost  1 drop Both Eyes QHS   mexiletine  250 mg Oral BID   mometasone-formoterol  2 puff Inhalation BID   multivitamin with minerals  1 tablet Oral Daily   nystatin   Topical BID   pantoprazole  40 mg Oral Daily   sertraline  50 mg Oral Daily   sildenafil  20 mg Oral TID   spironolactone  25 mg Oral Daily   umeclidinium bromide  1 puff Inhalation Daily   warfarin  3 mg Oral ONCE-1600   Warfarin - Pharmacist Dosing Inpatient   Does not apply q1600    Objective:   Weight Range: 78 kg Body mass index is 26.93 kg/m.   Vital Signs:   Temp:  [98.1 F (36.7 C)-98.7 F (37.1 C)] 98.1 F (36.7 C) (09/29 0358) Pulse Rate:  [65-91] 65 (09/29 0358) Resp:  [17-18] 18 (09/29 0358) BP: (92-109)/(58-97) 109/97 (09/29 0358) SpO2:  [95 %-99 %] 98 % (09/29 0358) Weight:  [78 kg] 78 kg (09/29 0353) Last BM Date : 10/13/22  Weight change: Filed Weights   10/13/22 1217 10/14/22 0500 10/15/22 0353  Weight: 75.8 kg 76.7 kg 78 kg   Intake/Output:   Intake/Output Summary (Last 24 hours) at 10/15/2022 1150 Last data filed at 10/15/2022 1000 Gross per 24 hour  Intake 477 ml  Output 2125 ml  Net -1648 ml     Physical Exam  Physical Exam: GENERAL: No acute distress. Walking with therapy   HEENT: normal  NECK: Supple, JVP 5  .  2+ bilaterally, no bruits.  No lymphadenopathy or thyromegaly appreciated.   CARDIAC:  Mechanical heart sounds with LVAD hum present.  LUNGS:  Clear to auscultation bilaterally.  ABDOMEN:  Soft, round, nontender, positive bowel sounds x4.     LVAD exit site: well-healed and incorporated.  Dressing dry and intact.  No erythema or drainage.  Stabilization device present and accurately applied.   EXTREMITIES:  Warm and dry, no cyanosis, clubbing, rash. Trace edema. RUE PICC  NEUROLOGIC:  Alert and oriented x 3.    No aphasia.  No dysarthria.  Affect pleasant.     Patient Profile  Mr Deloza is a 57 y.o. 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 Admitted with cardiogenic shock, stabilized and underwent HM3 LVAD. Post implant course c/b acute CVA and enterococcus faecalis bacteremia.   Assessment/Plan  1.  Acute on chronic Systolic HF-->Cardiogenic Shock  - Diagnosed 11/2019. Presented with VT. LHC 70% LAD  - cMRI 12/21 concerning for sarcoid and EF 18%.  - PET 2/22 at [facility] EF 25% + active  sarcoid - [employer] CRT-D upgrade in 06/08/21 - Echo 07/10/22: EF <20%, RV okay, mod pericardial effusion, mod MR/TR - Admitted 07/25 with cardiogenic shock. - RHC: Nonobstructive CAD, severely elevated filling pressures and low Fick CO/CI (2.7/1.4) - 08/13 HM III LVAD implant + clipping LAA d/t severe thickening and invagination of mitral valve annulus impeding flows.  - Apical core sent - no mention of sarcoid - Echo 8/26 mod-sev RV dysfunction. Speed increased to 5400 8/26 - Ramp Echo 9/4, speed increased to 5400  - MAP 80s, stable.  Volume status ok.  - On Sildenafil 20 mg tid.  - Continue spironolactone 25mg  daily - Continue digoxin 0.125 mg daily - Labs stable; hopeful for discharge this week. Euvolemic on exam.   2. HM-3 LVAD - Assessed controller while in PT.  - LDH stable - Continue ASA  81 - INR 2  Dosing per PharmD - Caregiver education completed.   3.  Acute stroke - Hx CVA 06/24 -Admitted 06/24 w/ R MCA stroke. S/p TPA and mechanical clot extraction. No residual deficits. Likely cardioembolic in setting of severe LV dysfunction. - Developed left sided weakness 08/14. CTA with R MCA infarct. Taken to IR for thrombectomy - Repeat CT head with small to moderate size hemorrhagic conversion.  - repeat head CT on 8/17 w/ improved hemorrhagic CVA - On warfarin.  - Functional deficits improving. L hand getting a little stronger.  - Continue ASA 81 mg daily  - Appreciate CIR  4. Enterococcus faecalis bacteremia - Bcx 2/2 on 9/6 - ID consult 9/6 -> ampicillin and ceftriaxone - TEE recommended but doubt it will change management as VAD cannot be removed and will require IV abx followed by long-term suppression - Echo 09/10 - no obvious vegetations -  Repeat BCx 9/8 NGTD. - ID recommending 6 weeks IV amp/ceftriaxone followed by long-term suppression with amoxicillin. Stop date 11/04/22   5. Hx VT - ln setting of potential sarcoid heart disease  - Off amio due to tremor. Continue mexiletine, ICD in place   6. CAD - LHC 12/07/19 70-% LAD, no intervention - LHC 8/24 non obstructive CAD.  - Continue statin. On aspirin for VAD.  - No chest pain.   7. 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   8. 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 Iron deficiency anemia/ Post-op anemia - Has had IV Fe.    10. Pulmonary  - PFTs with severe obstructive defect, response to bronchodilator. FEV1 1.04L, FEV1/FVC 48% - Stable  11. Psych - Depression and delirium post VAD implant. Prolonged hospital course.  - Improving - Continue sertraline  12. Left arm  swelling - 9/20 LUE dopplers with chronic superficial vein thrombosis.    Tonni Mansour 11:50 AM

## 2022-10-16 LAB — PROTIME-INR
INR: 2.5 — ABNORMAL HIGH (ref 0.8–1.2)
Prothrombin Time: 26.9 s — ABNORMAL HIGH (ref 11.4–15.2)

## 2022-10-16 LAB — CBC WITH DIFFERENTIAL/PLATELET
Abs Immature Granulocytes: 0.01 10*3/uL (ref 0.00–0.07)
Basophils Absolute: 0 10*3/uL (ref 0.0–0.1)
Basophils Relative: 1 %
Eosinophils Absolute: 0.2 10*3/uL (ref 0.0–0.5)
Eosinophils Relative: 4 %
HCT: 32.4 % — ABNORMAL LOW (ref 39.0–52.0)
Hemoglobin: 9.3 g/dL — ABNORMAL LOW (ref 13.0–17.0)
Immature Granulocytes: 0 %
Lymphocytes Relative: 18 %
Lymphs Abs: 1.1 10*3/uL (ref 0.7–4.0)
MCH: 26.6 pg (ref 26.0–34.0)
MCHC: 28.7 g/dL — ABNORMAL LOW (ref 30.0–36.0)
MCV: 92.6 fL (ref 80.0–100.0)
Monocytes Absolute: 0.8 10*3/uL (ref 0.1–1.0)
Monocytes Relative: 13 %
Neutro Abs: 3.8 10*3/uL (ref 1.7–7.7)
Neutrophils Relative %: 64 %
Platelets: 229 10*3/uL (ref 150–400)
RBC: 3.5 MIL/uL — ABNORMAL LOW (ref 4.22–5.81)
RDW: 18 % — ABNORMAL HIGH (ref 11.5–15.5)
WBC: 5.9 10*3/uL (ref 4.0–10.5)
nRBC: 0 % (ref 0.0–0.2)

## 2022-10-16 LAB — BASIC METABOLIC PANEL
Anion gap: 7 (ref 5–15)
BUN: 10 mg/dL (ref 6–20)
CO2: 26 mmol/L (ref 22–32)
Calcium: 8.4 mg/dL — ABNORMAL LOW (ref 8.9–10.3)
Chloride: 107 mmol/L (ref 98–111)
Creatinine, Ser: 0.64 mg/dL (ref 0.61–1.24)
GFR, Estimated: >60 mL/min (ref >60)
Glucose, Bld: 85 mg/dL (ref 70–99)
Potassium: 3.8 mmol/L (ref 3.5–5.1)
Sodium: 140 mmol/L (ref 135–145)

## 2022-10-16 LAB — MAGNESIUM: Magnesium: 1.6 mg/dL — ABNORMAL LOW (ref 1.7–2.4)

## 2022-10-16 LAB — LACTATE DEHYDROGENASE: LDH: 186 U/L (ref 98–192)

## 2022-10-16 MED ORDER — WARFARIN SODIUM 2 MG PO TABS
2.0000 mg | ORAL_TABLET | Freq: Once | ORAL | Status: AC
  Administered 2022-10-16: 2 mg via ORAL
  Filled 2022-10-16: qty 1

## 2022-10-16 MED ORDER — CEFTRIAXONE IV (FOR PTA / DISCHARGE USE ONLY): 2.0000 g | Freq: Two times a day (BID) | INTRAVENOUS | 0 refills | Status: AC

## 2022-10-16 MED ORDER — AMPICILLIN IV (FOR PTA / DISCHARGE USE ONLY): 12.0000 g | Freq: Every day | INTRAVENOUS | 0 refills | Status: DC

## 2022-10-16 MED ORDER — MAGNESIUM SULFATE 4 GM/100ML IV SOLN
4.0000 g | Freq: Once | INTRAVENOUS | Status: AC
  Administered 2022-10-16: 4 g via INTRAVENOUS
  Filled 2022-10-16: qty 100

## 2022-10-16 NOTE — Progress Notes (Signed)
Occupational Therapy Discharge Summary  Patient Details  Name: Charles Holmes MRN: [id number] Date of Birth: Apr 19, 1965  Date of Discharge from OT service:October 16, 2022     Patient has met 9 of 9 long term goals due to improved activity tolerance, improved balance, postural control, ability to compensate for deficits, functional use of  LEFT upper and LEFT lower extremity, improved attention, improved awareness, and improved coordination.   Patient to discharge at [facility] Assist level.  Patient's care partner is independent to provide the necessary physical and cognitive assistance at discharge.  No formal family training on rehab unit,  caregiver trained by LVAD team.  Pt will need assist to manage his LVAD due to left hand weakness and difficulty managing holster for batteries with L hemiplegic arm and PICC line cords.    Reasons goals not met: n/a  Recommendation:  Patient will benefit from ongoing skilled OT services in home health setting to continue to advance functional skills in the area of BADL.  Equipment: BSC  Reasons for discharge: treatment goals met  Patient/family agrees with progress made and goals achieved: Yes  OT Discharge Precautions/Restrictions  Precautions Precautions: Sternal;Fall;Other (comment) Precaution Comments: LVAD    ADL ADL Eating: Set up Where Assessed-Eating: Edge of bed Grooming: Setup Where Assessed-Grooming: Standing at sink Upper Body Bathing: Minimal assistance Where Assessed-Upper Body Bathing: Edge of bed Lower Body Bathing: Supervision/safety Where Assessed-Lower Body Bathing: Edge of bed Upper Body Dressing: Minimal assistance Where Assessed-Upper Body Dressing: Edge of bed Lower Body Dressing: Minimal assistance Where Assessed-Lower Body Dressing: Edge of bed Toileting: Minimal assistance Where Assessed-Toileting: Teacher, adult education: Close supervision Toilet Transfer Method: Network engineer: Engineer, technical sales: Not assessed Film/video editor: Not assessed Vision Baseline Vision/History: 1 Wears glasses Patient Visual Report: No change from baseline Vision Assessment?: No apparent visual deficits Perception  Perception: Impaired Perception-Other Comments: improved from admission, continues to have minimal inattention to LUE Praxis Praxis Impairment Details: Motor planning Cognition Cognition Overall Cognitive Status: Impaired/Different from baseline Arousal/Alertness: Awake/alert Orientation Level: Person;Place;Situation Person: Oriented Place: Oriented Memory: Impaired Memory Impairment: Decreased recall of new information Safety/Judgment: Appears intact Brief Interview for Mental Status (BIMS) Repetition of Three Words (First Attempt): 3 Temporal Orientation: Year: Correct Temporal Orientation: Month: Accurate within 5 days Temporal Orientation: Day: Correct Recall: "Sock": Yes, no cue required Recall: "Blue": Yes, no cue required Recall: "Bed": Yes, no cue required BIMS Summary Score: 15 Sensation Sensation Light Touch: Appears Intact Hot/Cold: Appears Intact Proprioception: Impaired by gross assessment Stereognosis: Impaired by gross assessment Coordination Gross Motor Movements are Fluid and Coordinated: No Fine Motor Movements are Fluid and Coordinated: No Motor  Motor Motor: Hemiplegia Motor - Discharge Observations: pt is able to use his L arm as a gross A level during LVAD cord changes and with self care Mobility    Supervision with ambulation and standing balance Trunk/Postural Assessment    Functional during basic self care Balance Static Sitting Balance Static Sitting - Level of Assistance: 7: Independent Dynamic Sitting Balance Dynamic Sitting - Level of Assistance: 5: Stand by assistance Static Standing Balance Static Standing - Level of Assistance: 5: Stand by assistance Extremity/Trunk Assessment RUE  Assessment RUE Assessment: Within Functional Limits LUE Assessment Active Range of Motion (AROM) Comments: sh flexion to 110 degrees, full elbow flexion, partial wrist extension General Strength Comments: GRIP:  L: 17 lb  R: 60.3 lb     Pinch  R:21.8 lb  L: 5.6 lb LUE Body  System: Neuro Brunstrum levels for arm and hand: Arm;Hand Brunstrum level for arm: Stage IV Movement is deviating from synergy Brunstrum level for hand: Stage IV Movements deviating from synergies   Gailya Tauer 10/16/2022, 3:21 PM

## 2022-10-16 NOTE — Progress Notes (Addendum)
Patient ID: Charles Holmes, male   DOB: 03/21/1965, 57 y.o.   MRN: [id number]  NO PT recommendations as of 9/30 4:00 PM.   4:03 PM: SW informed pt and family were informed they would receive a HB before D/C. No PT recommendations currently.   SW will order bed through [employer]. Sw informed PT SW cannot guarantee delivery by tomorrow.   Updated delivery address: [address] [place], [place] [address]

## 2022-10-16 NOTE — Progress Notes (Signed)
Physical Therapy Discharge Summary  Patient Details  Name: Charles Holmes MRN: [id number] Date of Birth: [birth year]  Date of Discharge from PT service:October 16, 2022  Today's Date: 10/16/2022 PT Individual Time: 1503-1610 PT Individual Time Calculation (min): 67 min    Patient has met {NUMBERS 0-12:18577} of {NUMBERS 0-12:18577} long term goals due to improved activity tolerance, improved balance, increased strength, ability to compensate for deficits, functional use of  left lower extremity, improved attention, and improved awareness.  Patient to discharge at an ambulatory level  CGA .   Patient's care partner is independent to provide the necessary physical and cognitive assistance at discharge.  Reasons goals not met: ***  Recommendation:  Patient will benefit from ongoing skilled PT services in home health setting to continue to advance safe functional mobility, address ongoing impairments in strength, coordination, balance, activity tolerance, cognition, safety awareness, and to minimize fall risk.  Equipment: Hospital bed  Reasons for discharge: treatment goals met and discharge from hospital  Patient/family agrees with progress made and goals achieved: Yes  PT Discharge Precautions/Restrictions Precautions Precautions: Sternal;Fall;Other (comment) Precaution Comments: LVAD Vital Signs Therapy Vitals Temp: 98.7 F (37.1 C) Pulse Rate: 77 Resp: 18 BP: 97/77 Patient Position (if appropriate): Lying Oxygen Therapy SpO2: 96 % O2 Device: Room Air Pain Pain Assessment Pain Scale: 0-10 Pain Score: 0-No pain Pain Interference Pain Interference Pain Effect on Sleep: 0. Does not apply - I have not had any pain or hurting in the past 5 days Pain Interference with Therapy Activities: 1. Rarely or not at all Pain Interference with Day-to-Day Activities: 1. Rarely or not at all Vision/Perception  Vision - History Ability to See in Adequate Light: 0  Adequate Perception Perception: Impaired Preception Impairment Details: Inattention/Neglect Perception-Other Comments: improved from admission, continues to have minimal inattention to LUE Praxis Praxis: Impaired Praxis Impairment Details: Motor planning  Cognition Overall Cognitive Status: Impaired/Different from baseline Arousal/Alertness: Awake/alert Orientation Level: Oriented X4 Sustained Attention: Appears intact Memory: Impaired Memory Impairment: Decreased recall of new information;Decreased short term memory Problem Solving: Impaired Reasoning: Impaired Self Monitoring: Impaired Self Monitoring Impairment: Functional basic Safety/Judgment: Impaired Sensation Sensation Light Touch: Appears Intact Hot/Cold: Appears Intact Proprioception: Impaired by gross assessment Stereognosis: Impaired by gross assessment Coordination Gross Motor Movements are Fluid and Coordinated: No Fine Motor Movements are Fluid and Coordinated: No Coordination and Movement Description: grossly uncoordinated 2/2 L hemi Finger Nose Finger Test: limited L UE 2/2 weakness and ROM deficits Heel Shin Test: WFL bilaterally Motor  Motor Motor: Hemiplegia Motor - Skilled Clinical Observations: L hemi and LVAD Motor - Discharge Observations: Able to use his L arm at a gross A level during LVAD cord changes and with self care  Mobility Bed Mobility Bed Mobility: Rolling Right;Rolling Left;Supine to Sit;Sit to Supine Rolling Right: Independent with assistive device Rolling Left: Independent with assistive device Supine to Sit: Supervision/Verbal cueing Sit to Supine: Supervision/Verbal cueing Transfers Transfers: Sit to Stand;Stand to Sit;Stand Pivot Transfers Sit to Stand: Supervision/Verbal cueing;Contact Guard/Touching assist Stand to Sit: Supervision/Verbal cueing Stand Pivot Transfers: Supervision/Verbal cueing Transfer (Assistive device): None Locomotion  Gait Ambulation: Yes Gait  Assistance: Supervision/Verbal cueing;Contact Guard/Touching assist Gait Distance (Feet): 600 Feet Assistive device: None;Other (Comment) (IV pole) Gait Gait: Yes Gait Pattern: Within Functional Limits Gait velocity: decreased but improved from [facility] Stairs / Additional Locomotion Stairs: Yes Stairs Assistance: Supervision/Verbal cueing Stair Management Technique: Two rails Number of Stairs: 8 Height of Stairs: 6 Ramp: Contact Guard/touching assist Curb: Contact Guard/Touching assist Wheelchair Mobility  Wheelchair Mobility: No  Trunk/Postural Assessment  Cervical Assessment Cervical Assessment: Exceptions to [facility] (forward head) Thoracic Assessment Thoracic Assessment: Exceptions to [facility] (rounded shoulders) Lumbar Assessment Lumbar Assessment: Exceptions to [facility] (posterior pelvic tilt in sitting and standing) Postural Control Postural Control: Within Functional Limits  Balance Standardized Balance Assessment Standardized Balance Assessment: Timed Up and Go Test (5xSTS = 18 sec) Timed Up and Go Test TUG: Normal TUG Normal TUG (seconds): 17.9 Static Sitting Balance Static Sitting - Balance Support: Feet supported Static Sitting - Level of Assistance: 7: Independent Dynamic Sitting Balance Dynamic Sitting - Balance Support: Feet supported Dynamic Sitting - Level of Assistance: 6: Modified independent (Device/Increase time) Static Standing Balance Static Standing - Balance Support: During functional activity;No upper extremity supported Static Standing - Level of Assistance: 5: Stand by assistance Dynamic Standing Balance Dynamic Standing - Balance Support: During functional activity;Right upper extremity supported Dynamic Standing - Level of Assistance: Other (comment) (SBA/ CGA) Extremity Assessment  RUE Assessment RUE Assessment: Within Functional Limits LUE Assessment Active Range of Motion (AROM) Comments: sh flexion to 110 degrees, full elbow flexion, partial wrist  extension General Strength Comments: GRIP:  L: 17 lb  R: 60.3 lb     Pinch  R:21.8 lb  L: 5.6 lb LUE Body System: Neuro Brunstrum levels for arm and hand: Arm;Hand Brunstrum level for arm: Stage IV Movement is deviating from synergy Brunstrum level for hand: Stage IV Movements deviating from synergies RLE Assessment RLE Assessment: Within Functional Limits LLE Assessment LLE Assessment: Exceptions to [facility] LLE Strength Left Hip Flexion: 4/5 Left Hip Extension: 4-/5 Left Hip ABduction: 4/5 Left Hip ADduction: 4+/5 Left Knee Flexion: 4-/5 Left Knee Extension: 4+/5 Left Ankle Dorsiflexion: 4+/5 Left Ankle Plantar Flexion: 4/5   Loel Dubonnet PT, DPT, CSRS 10/16/2022, 5:03 PM

## 2022-10-16 NOTE — Progress Notes (Signed)
Occupational Therapy Session Note  Patient Details  Name: Charles Holmes MRN: [id number] Date of Birth: July 07, 1965  Today's Date: 10/16/2022 OT Individual Time: 0900-0930 OT Individual Time Calculation (min): 30 min    Short Term Goals: Week 3:  OT Short Term Goal 1 (Week 3): STGs = LTGs  Skilled Therapeutic Interventions/Progress Updates:      Therapy Documentation Precautions:  Precautions Precautions: Sternal, Fall, Other (comment) Precaution Comments: LVAD Restrictions Weight Bearing Restrictions: No RUE Weight Bearing:  (sternal precaution) LUE Weight Bearing:  (sternal precaution) RLE Weight Bearing: Weight bearing as tolerated Other Position/Activity Restrictions: Sternal Precautions General: "That is my cat mustard" Pt supine in bed upon OT arrival, agreeable to OT session.   Pain: no pain reported  ADL: LB dressing: mod I in bed level to don mesh shorts  Exercises: Pt completed grip/pinch testing in order to complete standardized assessment for D/C. Pt results listed below: GRIP: L: 17 lb R: 60.3 lb  Pinch R:21.8 lb L: 5.6 lb  Other Treatments: Pt educated on energy conservation in preparation for D/C while completing ADLs.    Pt supine in bed with bed alarm activated, 2 bed rails up, call light within reach and 4Ps assessed.   Therapy/Group: Individual Therapy  Velia Meyer, OTD, OTR/L 10/16/2022, 12:36 PM

## 2022-10-16 NOTE — Plan of Care (Signed)
Per VAD coordinator; 10/13/22: PT/OT/VAD coordinators are working with pt on changing power sources. He is unable to do this independently due to his left sided weakness. Modification of his power cords was made in hopes he can grip the cords better. Has practice power cords for use by pt. He is making progress with therapy, but is still having difficulty with changes. Will need supervision/hands on assistance with power source changes at home.

## 2022-10-16 NOTE — Progress Notes (Signed)
Occupational Therapy Session Note  Patient Details  Name: Charles Holmes MRN: [id number] Date of Birth: 04/13/1965  Today's Date: 10/16/2022 OT Individual Time: 1340-1435 OT Individual Time Calculation (min): 55 min    Short Term Goals: Week 1:  OT Short Term Goal 1 (Week 1): Pt will complete toilet transfers min A using LRAD OT Short Term Goal 1 - Progress (Week 1): Met OT Short Term Goal 2 (Week 1): Pt will recall hemi-dressing techniques with min verbal cues OT Short Term Goal 2 - Progress (Week 1): Met OT Short Term Goal 3 (Week 1): Pt will maintain sternal precautions during BADLs and functional transfers at 95% safe with min verbal cues OT Short Term Goal 3 - Progress (Week 1): Met OT Short Term Goal 4 (Week 1): Pt will maintain dynamic standing balance using LRAD during functional task >1 minute to demonstrate increased activity tolerance OT Short Term Goal 4 - Progress (Week 1): Met Week 2:  OT Short Term Goal 1 (Week 2): Pt will demonstrate improved L FMC to fasten and unfasten LVAD connectors in less than 1 minute. OT Short Term Goal 1 - Progress (Week 2): Progressing toward goal OT Short Term Goal 2 (Week 2): Pt will demonstrate improved L hand function to grasp pants to pull over L side of hips with min A. OT Short Term Goal 2 - Progress (Week 2): Met OT Short Term Goal 3 (Week 2): Pt will demonstrate improved memory to recall steps of LVAD battery change with min cues. OT Short Term Goal 3 - Progress (Week 2): Met OT Short Term Goal 4 (Week 2): Pt will complete BSC or toilet transfers with min A. OT Short Term Goal 4 - Progress (Week 2): Met Week 3:  OT Short Term Goal 1 (Week 3): STGs = LTGs  Skilled Therapeutic Interventions/Progress Updates:    Pt received in bed connected to LVAD power cables to main power box,  pt agreeable to working on basic ADLS and practicing L hemidressing strategies, management of LVAD cords,  balance, toileting skills.   Pt initially worked on  Financial risk analyst with donning and doffing tshirt but unable to keep on as PICC line cords were getting tangled in shirt.   Pt practiced changing out cords from power box to batteries.  He can undo cords and snap them in to battery packs, but he continues to need A with placing packs into vest, donning vest.   If he does it without A, the cords get very tangled and at risk for pulling on his line site. Tried various methods of donning vest first,  then putting battery packs in but overall he does and WILL need assist from his caregiver.  At one point he even disconnected his battery from the clip while it was attached to his power cord to try to fit it in the vest pocket! Reminded him that is his power source and not to do that once cable is connected. His L hand weakness does contribute but he also has the great challenge of dealing with the PICC line cables and trying to not get tangled with those. Pt continues to ask about going to the bathroom himself, he needs reminders that due to cables and cords he can get tangled and tripped up easily.   He then walked to toilet with supervision and A to manage IV pole.  Toileted with supervision and stood to cleanse front area. He ambulated to sink to wash hands and face.  He is  happy that L hand edema has decreased and he can use his hand more.  Pt resting in recliner with all needs met.    Therapy Documentation Precautions:  Precautions Precautions: Sternal, Fall, Other (comment) Precaution Comments: LVAD Restrictions Weight Bearing Restrictions: No RUE Weight Bearing:  (sternal precaution) LUE Weight Bearing:  (sternal precaution) RLE Weight Bearing: Weight bearing as tolerated Other Position/Activity Restrictions: Sternal Precautions Therapy Vitals Temp: 98.7 F (37.1 C) Pulse Rate: 77 Resp: 18 BP: 97/77 Patient Position (if appropriate): Lying Oxygen Therapy SpO2: 96 % O2 Device: Room Air Pain: no c/o pain    ADL: ADL Eating: Set up Where  Assessed-Eating: Edge of bed Grooming: Setup Where Assessed-Grooming: Standing at sink Upper Body Bathing: Minimal assistance Where Assessed-Upper Body Bathing: Edge of bed Lower Body Bathing: Supervision/safety Where Assessed-Lower Body Bathing: Edge of bed Upper Body Dressing: Minimal assistance Where Assessed-Upper Body Dressing: Edge of bed Lower Body Dressing: Minimal assistance Where Assessed-Lower Body Dressing: Edge of bed Toileting: Minimal assistance Where Assessed-Toileting: Teacher, adult education: Close supervision Toilet Transfer Method: Proofreader: Engineer, technical sales: Not assessed Film/video editor: Not assessed   Therapy/Group: Individual Therapy  Zelma Snead 10/16/2022, 2:56 PM

## 2022-10-16 NOTE — Progress Notes (Signed)
LVAD Coordinator Rounding Note:  Admitted 08/10/22 due to acute on chronic CHF with cardiogenic shock. Milrinone dependent. Advance therapy workup completed, and pt deemed acceptable VAD candidate. Dental extractions completed 8/6. IABP placed 08/25/22.  HM 3 LVAD implanted on 08/29/22 by Dr Donata Clay under destination therapy criteria. 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 Result negative.  7/25 Admit with cardiogenic shock. Started milrinone and NE. 8/6 S/P 13 teeth extractions  8/9 IABP placed 8/13 S/p HM III LVAD implant + clipping left atrial appendage d/t severe thickening and invagination of mitral valve annulus impeding flows  CT Head 8/14 (initial) 1. 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 ASPECTS is 7. 2. No acute hemorrhage.  CT Angio Head/Neck 1. 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 2. Core infarct of 12 cc (somewhat underestimated compared to aspects) with 90 cc of penumbra. 3. Mild atherosclerosis.  Pt taken emergently to IR for percutaneous right common carotid arteriogram with thrombectomy. Revascularization achieved. Angio-seal closure device applied to left groin- clean, dry, and intact.   CT Head 8/15 @ 0450 Unchanged extent of infarct and hemorrhage in the right MCA distribution including small volume intraventricular clot. No hydrocephalus.  CT Head 8/17 @ 0701 Interval evolution of the right MCA territory infarct with decreased intraparenchymal and intraventricular hemorrhage. No hydrocephalus or midline shift.   Pt lying in bed this morning. Pt states he is excited for discharge tomorrow and is ready to see his grandchildren. No complaints this morning other than his drive line dressing has been "irritating him". VAD Coordinator changed dressing today. See details below. Pt denies pain or trauma at the exit site.   VAD home equipment has arrived. Tentative discharge date 10/17/22.   Blood  cultures from 9/6 positive for Enterococcus Faecalis. Subsequent blood cultures NGTD. ID following recommending 6 weeks of IV Ampicillin and Ceftriaxone followed by chronic suppression with Amoxicillin.   PT/OT/VAD coordinators are working with pt on changing power sources. He is unable to do this independently due to his left sided weakness. Modification of his power cords was made in hopes he can grip the cords better. Has practice power cords for use by pt. He is making progress with therapy, but is still having difficulty with changes. Will need supervision/hands on assistance with power source changes at home.   Pt may not shower at this time.   Vital signs: Temp: 98 HR: 85 Doppler Pressure: 82 Automatic BP: 102/81 (89) O2 Sat: 97% on RA Wt: 183.6>191.1>190.9>190.2>184.8>...>163.1>162.5>171.3>172.4>176.6> 180.6>175.1>174.8>176.3>175.7>177.5>173>171.5>167.3>169.9>160.1>171.9 lbs   LVAD interrogation reveals:  Speed: 5400 Flow: 4.3 Power: 3.9 w PI: 4.5  Alarms: none Events: 11 PI Events noted today  Hematocrit: 32 Fixed speed: 5400 Low speed limit: 5100  Drive Line:  Existing VAD dressing removed and site care performed using sterile technique. Drive line exit site cleaned with Chlora prep applicators x 2, rinsed with saline, allowed to dry, and Vashe moistened 2x2 placed around driveline then covered with dry 4x4. Exit site healing and unincorporated, the velour is fully implanted at exit site. Scant amount of serous drainage.Slight redness, no tenderness, foul odor or rash noted. Drive line anchor re-applied. Continue twice a week dressing changes on Monday/Thursday. Next dressing change due 10/19/22 by VAD coordinator or nurse champion only or [place].    Labs:  LDH trend: 596>441>165>384>345>312>312>350>327>309>316>305>298>277>273>298>280>285>251>252>205>193>202>186  INR trend:  1.4>1.6>1.1>1.1>1.3>1.2>1>1.3>1.4>1.5>1.6>2.8>2.8>2.3>2.2>2.3>2.6>2.6>1.7>2>1.8>1.7>2.1>1.8>2.4>2.3>2.0>1.8>2.5  AST/ALT trend: 218/45>96/30>73/23>63/36>58/37>69/50>93/72>123/103>143/150>117/138>119/138>92/127>87/123>50/71>46/65>46/43  Total Bili trend: 4.4>4.6>5.1>1.8>1.9>1.2>1.0>1.5>1.1>0.8>0.9>1.0>0.8  WBC trend: 11.6>13.7>19.6>16.1>23>26.6>21>24.6>18.1>16.5>13.2>13.1>12.5>11.6>10.5>10.8>13.1>9.5>9.1>9.7>7.8>6.7>6.7>6.4>5.9  Anticoagulation Plan: -INR Goal: 2.0 -  2.5 -ASA Dose: 81 mg  - Coumadin dosing per pharmacy  Blood Products:  IntraOp 8/14: - 4 FFP - 2 Platelets - 2 PRBC - 1 cyro - 449 cc of cellsaver - DDAVP 20 mcg x 1   Device: Medtronic BiV -Therapies: ON VF ON >207 BPM FVT ON 176-207 BPM VT ON 171-207 BPM  Arrythmias:   Respiratory:   Infection:  09/01/22>> sputum cx>> NGTD>>final 09/01/22>> blood cx>> NGTD>>final 09/01/22>> urine cx>> NGTD>> final 09/13/22>>blood cxs>>no growth 5 days; final 09/22/22>>blood cxs>> Enterococcus faecalis  09/23/22>> blood cxs>> neg final 09/24/22>> blood cxs>> neg final  Renal:  9/23:7/0.71  Adverse Events on VAD: - 08/30/22: - Developed left sided weakness this am. CTA with R MCA infarct. Taken to IR for thrombectomy   Patient Education: Discharge teaching completed with pt's [name] on 9/5. See separate note for documentation.   Plan/Recommendations:  Please page VAD coordinator for any alarms or VAD equipment issues. Continue twice a week dressing changes on Monday/Thursday by VAD coordinator, Nurse Alla Feeling, or [name].  Simmie Davies RN,BSN VAD Coordinator  Office: [phone]  24/7 Pager: [phone]

## 2022-10-16 NOTE — Plan of Care (Signed)
Following LTGs have been deemed not applicable as pt will discharge as a functional ambulator in the home. Will not require w/c for home mobilty.   Problem: RH Wheelchair Mobility Goal: LTG Patient will propel w/c in home environment (PT) Description: LTG: Patient will propel wheelchair in home environment, # of feet with assistance (PT). Outcome: Not Applicable Goal: LTG Patient will propel w/c in community environment (PT) Description: LTG: Patient will propel wheelchair in community environment, # of feet with assist (PT) Outcome: Not Applicable

## 2022-10-16 NOTE — Progress Notes (Signed)
Patient ID: Charles Holmes, male   DOB: 06-17-1965, 57 y.o.   MRN: [id number]     Advanced Heart Failure Rounding Note  PCP-Cardiologist: Charles Herrlich, MD  [facility]: Dr. Gala Holmes   Subjective:    Admitted to CIR on 09/11  INR 2.5 today; left hand much improved; no swelling now. Reports that he has more functionality.   LVAD Interrogation HM III: Speed: 5400 Flow: 4.4 PI: 3.9  Power: 3.9. 11 PI events.     aspirin EC  81 mg Oral Daily   atorvastatin  80 mg Oral Daily   Chlorhexidine Gluconate Cloth  6 each Topical BID   digoxin  0.125 mg Oral Daily   dorzolamide-timolol  1 drop Both Eyes BID   Charles Holmes   Topical TID   influenza vac split trivalent PF  0.5 mL Intramuscular Tomorrow-1000   latanoprost  1 drop Both Eyes QHS   mexiletine  250 mg Oral BID   mometasone-formoterol  2 puff Inhalation BID   multivitamin with minerals  1 tablet Oral Daily   nystatin   Topical BID   pantoprazole  40 mg Oral Daily   sertraline  50 mg Oral Daily   sildenafil  20 mg Oral TID   spironolactone  25 mg Oral Daily   umeclidinium bromide  1 puff Inhalation Daily   Warfarin - Pharmacist Dosing Inpatient   Does not apply q1600    Objective:   Weight Range: 78 kg Body mass index is 26.93 kg/m.   Vital Signs:   Temp:  [97.8 F (36.6 C)-98.7 F (37.1 C)] 98 F (36.7 C) (09/30 0500) Pulse Rate:  [85-87] 85 (09/30 0500) Resp:  [18] 18 (09/29 1333) BP: (97-108)/(73-84) 102/81 (09/30 0500) SpO2:  [96 %-98 %] 97 % (09/30 0500) Last BM Date : 10/16/22  Weight change: Filed Weights   10/13/22 1217 10/14/22 0500 10/15/22 0353  Weight: 75.8 kg 76.7 kg 78 kg   Intake/Output:   Intake/Output Summary (Last 24 hours) at 10/16/2022 1009 Last data filed at 10/16/2022 0859 Gross per 24 hour  Intake 955.06 ml  Output 1400 ml  Net -444.94 ml     Physical Exam  Physical Exam: GENERAL: No acute distress. Walking with therapy  HEENT: normal  NECK: Supple, JVP 5-6  .  2+ bilaterally,  no bruits.  No lymphadenopathy or thyromegaly appreciated.   CARDIAC:  Mechanical heart sounds with LVAD hum present.  LUNGS:  Clear to auscultation bilaterally.  ABDOMEN:  Soft, round, nontender, positive bowel sounds x4.     LVAD exit site: well-healed and incorporated.  Dressing dry and intact.  No erythema or drainage.  Stabilization device present and accurately applied.   EXTREMITIES:  Warm and dry, no cyanosis, clubbing, rash. Trace edema. RUE PICC  NEUROLOGIC:  Alert and oriented x 3.    No aphasia.  No dysarthria.  Affect pleasant.     Patient Profile  [name] is a 57 y.o. 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 Admitted with cardiogenic shock, stabilized and underwent HM3 LVAD. Post implant course c/b acute CVA and enterococcus faecalis bacteremia.   Assessment/Plan  1.  Acute on chronic Systolic HF-->Cardiogenic Shock  - Diagnosed 11/2019. Presented with VT. LHC 70% LAD  - cMRI 12/21 concerning for sarcoid and EF 18%.  - PET 2/22 at [facility] EF 25% + active sarcoid - Medtronic CRT-D upgrade in 06/08/21 - Echo 07/10/22: EF <20%,  RV okay, mod pericardial effusion, mod MR/TR - Admitted 07/25 with cardiogenic shock. - RHC: Nonobstructive CAD, severely elevated filling pressures and low Fick CO/CI (2.7/1.4) - 08/13 HM III LVAD implant + clipping LAA d/t severe thickening and invagination of mitral valve annulus impeding flows.  - Apical core sent - no mention of sarcoid - Echo 8/26 mod-sev RV dysfunction. Speed increased to 5400 8/26 - Ramp Echo 9/4, speed increased to 5400  - MAP 80s, stable.  Volume status ok.  - On Sildenafil 20 mg tid.  - Continue spironolactone 25mg  daily - Continue digoxin 0.125 mg daily - Labs stable; plan for discharge tomorrow. Euvolemic on exam.   2. HM-3 LVAD - Assessed controller while in PT.  - LDH stable - Continue ASA 81 - INR 2  Dosing per PharmD - Caregiver education  completed.   3.  Acute stroke - Hx CVA 06/24 -Admitted 06/24 w/ R MCA stroke. S/p TPA and mechanical clot extraction. No residual deficits. Likely cardioembolic in setting of severe LV dysfunction. - Developed left sided weakness 08/14. CTA with R MCA infarct. Taken to IR for thrombectomy - Repeat CT head with small to moderate size hemorrhagic conversion.  - repeat head CT on 8/17 w/ improved hemorrhagic CVA - On warfarin.  - Functional deficits improving. L hand getting a little stronger.  - Continue ASA 81 mg daily  - Appreciate CIR  4. Enterococcus faecalis bacteremia - Bcx 2/2 on 9/6 - ID consult 9/6 -> ampicillin and ceftriaxone - TEE recommended but doubt it will change management as VAD cannot be removed and will require IV abx followed by long-term suppression - Echo 09/10 - no obvious vegetations -  Repeat BCx 9/8 NGTD. - ID recommending 6 weeks IV amp/ceftriaxone followed by long-term suppression with amoxicillin. Stop date 11/04/22   5. Hx VT - ln setting of potential sarcoid heart disease  - Off amio due to tremor. Continue mexiletine, ICD in place   6. CAD - LHC 12/07/19 70-% LAD, no intervention - LHC 8/24 non obstructive CAD.  - Continue statin. On aspirin for VAD.  - No chest pain.   7. 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   8. 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 Iron deficiency anemia/ Post-op anemia - Has had IV Fe.    10. Pulmonary  - PFTs with severe obstructive defect, response to bronchodilator. FEV1 1.04L, FEV1/FVC 48% - Stable  11. Psych - Depression and delirium post VAD implant. Prolonged hospital course.  - Improving - Continue sertraline  12. Left arm swelling - 9/20 LUE dopplers with chronic superficial vein  thrombosis.    Charles Holmes 10:09 AM

## 2022-10-16 NOTE — Progress Notes (Signed)
PROGRESS NOTE   Subjective/Complaints:  Pt lying in bed without shirt. Likes to sleep that way (like at home). Is concerned about discomfort at driveline site. Dressing has come loose also.   ROS: Patient denies fever, rash, sore throat, blurred vision, dizziness, nausea, vomiting, diarrhea, cough, shortness of breath or chest pain, joint or back/neck pain, headache, or mood change.   Objective:   No results found. Recent Labs    10/16/22 0435  WBC 5.9  HGB 9.3*  HCT 32.4*  PLT 229      Recent Labs    10/13/22 1806 10/16/22 0435  NA 140 140  K 4.0 3.8  CL 105 107  CO2 26 26  GLUCOSE 112* 85  BUN 7 10  CREATININE 0.90 0.64  CALCIUM 8.4* 8.4*    Intake/Output Summary (Last 24 hours) at 10/16/2022 0909 Last data filed at 10/16/2022 0859 Gross per 24 hour  Intake 955.06 ml  Output 1600 ml  Net -644.94 ml        Physical Exam: Vital Signs Blood pressure 102/81, pulse 85, temperature 98 F (36.7 C), temperature source Oral, resp. rate 18, height 5\' 7"  (1.702 m), weight 78 kg, SpO2 97%.    Constitutional: No distress . Vital signs reviewed. HEENT: NCAT, EOMI, oral membranes moist Neck: supple Cardiovascular: LVAD Hum   Respiratory/Chest: CTA Bilaterally without wheezes or rales. Normal effort    GI/Abdomen: BS +,  non-distended. Slight erythema around driveline site. Ext: no clubbing, cyanosis, or edema Psych: pleasant and cooperative  Skin: No evidence of breakdown, no evidence of rash, well healing surgical incisions to chest/abd Neuro:  Alert and oriented x 3.   Normal language and speech. Cranial nerve exam unremarkable except for mild left central 7. MMT: LUE delt, bi, tri, grip   3/5 prox to distal. LLE 4-/5 throughout. Pt with intact  LT sense on left but feels a little different compared to R side --stable neuro exam  Musculoskeletal:no  pain with AROM in all 4 ext    Assessment/Plan: 1.  Functional deficits which require 3+ hours per day of interdisciplinary therapy in a comprehensive inpatient rehab setting. Physiatrist is providing close team supervision and 24 hour management of active medical problems listed below. Physiatrist and rehab team continue to assess barriers to discharge/monitor patient progress toward functional and medical goals  Care Tool:  Bathing    Body parts bathed by patient: Chest, Left arm, Abdomen, Front perineal area, Face, Left upper leg   Body parts bathed by helper: Right arm, Buttocks, Left lower leg, Right lower leg     Bathing assist Assist Level: Moderate Assistance - Patient 50 - 74%     Upper Body Dressing/Undressing Upper body dressing   What is the patient wearing?: Pull over shirt    Upper body assist Assist Level: Moderate Assistance - Patient 50 - 74%    Lower Body Dressing/Undressing Lower body dressing      What is the patient wearing?: Pants     Lower body assist Assist for lower body dressing: Moderate Assistance - Patient 50 - 74%     Toileting Toileting    Toileting assist Assist for toileting: Minimal Assistance -  Patient > 75%     Transfers Chair/bed transfer  Transfers assist  Chair/bed transfer activity did not occur: Safety/medical concerns (fatigue)  Chair/bed transfer assist level: Supervision/Verbal cueing     Locomotion Ambulation   Ambulation assist   Ambulation activity did not occur: Safety/medical concerns (fatigue)  Assist level: Supervision/Verbal cueing Assistive device: Other (comment) (IV pole) Max distance: 150'   Walk 10 feet activity   Assist  Walk 10 feet activity did not occur: Safety/medical concerns (fatigue)  Assist level: Supervision/Verbal cueing Assistive device: Other (comment) (IV pole)   Walk 50 feet activity   Assist Walk 50 feet with 2 turns activity did not occur: Safety/medical concerns  Assist level: Supervision/Verbal cueing Assistive device:  Other (comment) (IV pole)    Walk 150 feet activity   Assist Walk 150 feet activity did not occur: Safety/medical concerns  Assist level: Supervision/Verbal cueing Assistive device:  (IV pole)    Walk 10 feet on uneven surface  activity   Assist Walk 10 feet on uneven surfaces activity did not occur: Safety/medical concerns (fatigue)   Assist level: Supervision/Verbal cueing Assistive device:  (IV pole)   Wheelchair     Assist Is the patient using a wheelchair?: Yes Type of Wheelchair: Manual Wheelchair activity did not occur: Safety/medical concerns (fatigue)         Wheelchair 50 feet with 2 turns activity    Assist    Wheelchair 50 feet with 2 turns activity did not occur: Safety/medical concerns       Wheelchair 150 feet activity     Assist  Wheelchair 150 feet activity did not occur: Safety/medical concerns       Blood pressure 102/81, pulse 85, temperature 98 F (36.7 C), temperature source Oral, resp. rate 18, height 5\' 7"  (1.702 m), weight 78 kg, SpO2 97%.  Medical Problem List and Plan: 1. Functional deficits secondary to acute right MCA infarct/ LVAD pt             -patient may shower             -ELOS/Goals: modI/S 10/17/22   -Continue CIR therapies including PT, OT, and SLP    2.  Antithrombotics: -DVT/anticoagulation:  Pharmaceutical: Coumadin per pharmacy -9/30 INR 2.5              -antiplatelet therapy: Aspirin 81 mg daily   3. Pain Management: Tylenol and Tramadol as needed   -LUQ Is due to LVAD driveline--reposition self as possible when needed. 4. Mood/Behavior/Sleep: delirium/insomnia - team to provide ongoing ego support             -continue mirtazapine 7.5 mg q HS             -continue sertraline 50 mg daily             -continue melatonin 3 mg q HS -Started Trazodone 50mg  at bedtime on 09/29/22             -antipsychotic agents: n/a -9/13- mood somewhat better than when saw pt Tuesday- con't regimen and  monitor -09/30/22 not sleeping well, increased Melatonin to 5mg  QHS; if not helping, may consider restarting mirtazapine as above 9/18 poor sleep related to freq urination related to lasix , intake of fluids is minimal may need to use comp hose to prvent accumulation during the day and prevent nocturnal diuresis  -10/07/22 mirtazepine restarted 7.5mg  QHS -- improved sleep   -9/28-30/24 trazodone, mirtazepine, and melatonin now PRN, pt sleeping better without them, cont  monitoring 5. Neuropsych/cognition: This patient is capable of making decisions on his own behalf.   6. Skin/Wound Care: Routine skin care checks   -redness at driveline site. No purulence or drainage. Labs ok. Will ask HF team to check 7. Fluids/Electrolytes/Nutrition: strict Is and Os and follow-up chemistries             -poor PO intake on Marinol             -continue supplements   -9/12 labs reviewed. low albumin still continue interventions above -9/30 labs stable, daily labs per HF team-->MWF  8: Hypotension but difficult to assess due to LVAD    9/13- BP soft, but denies orthostatic hypotension -9/14-15/24 BPs generally stable/improving; was previously on midodrine, but per HF team pt was stable off it;  Difficult to measure BP due to LVAD as well as LUE edema post CVA -9/24 bp's better off of lasix  Systolic mainly in 100s now which is better for CVD -9/28-30/24 BPs fairly stable; monitor Vitals:   10/13/22 0000 10/13/22 0438 10/13/22 1321 10/13/22 1939  BP: (!) 125/103 93/81 97/81 (!) 86/75   10/14/22 0507 10/14/22 1353 10/14/22 1923 10/15/22 0358  BP: 106/69 (!) 92/58 101/88 (!) 109/97   10/15/22 1333 10/15/22 2200 10/16/22 0030 10/16/22 0500  BP: 108/84 97/73 98/75  102/81    9: Hyperlipidemia: continue atorvastatin 80mg  daily   10: Enterococcus bacteremia: 6 weeks IV antibiotic, ceftriaxone and ampicillin last dose 10/19 will go home with PICC and HH             -follow-up with ID  -09/30/22 PICC placed    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: Home meds: Coreg 3.125, Lasix 40mg , Lostartan 25mg , Aldactone 12.5, Farxiga 10mg , mexilitene --holding all currently   12: Acute on chronic systolic heart failure with cardiogenic shock s/p LVAD             -daily weights balanced so far             -continue digoxin 0.125 mg daily             -continue mexiletine 250 mg BID -continue midodrine 5 mg q 8 hours--not ordered, per HF team on 9/13 pt was stable off Midodrine; hold for now             -continue sildenafil 20 mg q 8 hours             -continue warfarin per pharmacy Managed by Heart Failure team - coordinating with primary service for activity tolerance monitoring  -10/11/22 wt down today ?technique -10/14/22 wt stable -9/30 weights stable. No edema Filed Weights   10/13/22 1217 10/14/22 0500 10/15/22 0353  Weight: 75.8 kg 76.7 kg 78 kg    13: Hx of VT: continue mexiletine, digoxin; has ICD   14: DM-2: A1c = 5.5%; monitor PO intake; DC SSI (Farxiga at home)   15: OSA: needs outpatient sleep study   16: Anemia, iron deficiency: follow-up CBC--most recent hgb 9.5--lab pending still today?  -9/30 stable at 9.3    Latest Ref Rng & Units 10/16/2022    4:35 AM 10/11/2022    4:36 AM 10/10/2022    4:12 AM  CBC  WBC 4.0 - 10.5 K/uL 5.9  6.4  6.7   Hemoglobin 13.0 - 17.0 g/dL 9.3  9.2  9.3   Hematocrit 39.0 - 52.0 % 32.4  32.4  32.5   Platelets 150 - 400 K/uL 229  243  254  17: Severe COPD:              -continue Ellipta one puff daily   18: S/p tooth extractions this admission: monitor PO tolerance   19: Glaucoma: continue Cosopt and Xalatan   20.  Candidiasis in groin start nystatin powder   21.  Dysuria- UA neg , on broad spectrum IV abx for bacteremia-- resolved 10/07/22  22.  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   - pt on warfarin which is now therapeutic.On pharmacy protocol   LOS: 19 days A FACE TO FACE EVALUATION WAS PERFORMED  Ranelle Oyster 10/16/2022, 9:09 AM

## 2022-10-16 NOTE — Progress Notes (Signed)
ANTICOAGULATION CONSULT NOTE - Follow-up  Pharmacy Consult for warfarin Indication:  LVAD  Allergies  Allergen Reactions   Pacerone [Amiodarone] Other (See Comments)    Severe tremors   Percocet [Oxycodone-Acetaminophen] Itching    Patient Measurements: Height: 5\' 7"  (170.2 cm) Weight: 78 kg (171 lb 15.3 oz) IBW/kg (Calculated) : 66.1 Heparin Dosing Weight: 87kg  Vital Signs: Temp: 98 F (36.7 C) (09/30 0500) Temp Source: Oral (09/30 0500) BP: 102/81 (09/30 0500) Pulse Rate: 85 (09/30 0500)  Labs: Recent Labs    10/13/22 1806 10/14/22 0359 10/15/22 0343 10/16/22 0435  HGB  --   --   --  9.3*  HCT  --   --   --  32.4*  PLT  --   --   --  229  LABPROT  --  20.6* 22.7* 26.9*  INR  --  1.7* 2.0* 2.5*  CREATININE 0.90  --   --  0.64    Estimated Creatinine Clearance: 93 mL/min (by C-G formula based on SCr of 0.64 mg/dL).   Medical History: Past Medical History:  Diagnosis Date   CAD (coronary artery disease)    CHF (congestive heart failure) (HCC)    COPD (chronic obstructive pulmonary disease) (HCC)    GERD (gastroesophageal reflux disease)    Hyperlipidemia    Hypertension    LVAD (left ventricular assist device) present (HCC)    Stroke (HCC)    Systolic heart failure (HCC) 2021   LVEF 18%, RVEF 38% on cardiac MRI 12/19/2019. possible cardiac sarcoidosis.   Wide-complex tachycardia 2021   wears LifeVest     Assessment: 57 yoM on apixaban PTA for hx AF admitted for LVAD workup. Pt s/p HM3 implant on 8/13 c/b acute CVA postop, on aspirin 81 mg daily. Started low dose heparin + warfarin started 8/19. Low-dose heparin was stopped 8/31.  INR up to 2.5 from 2. Hgb 9.3, plt 229. LDH 186. Oral intake documented at 100%.   Goal of Therapy:  INR 2-2.5 Monitor platelets by anticoagulation protocol: Yes   Plan:  Warfarin 2 mg pox 1 today Monitor daily INR, CBC  Thank you for allowing pharmacy to participate in this patient's care,  Sherron Monday, PharmD,  BCCCP Clinical Pharmacist  Phone: [phone] 10/16/2022 10:11 AM  Please check AMION for all [facility] phone numbers After 10:00 PM, call Main Pharmacy [phone]

## 2022-10-16 NOTE — Progress Notes (Signed)
Inpatient Rehabilitation Care Coordinator Discharge Note   Patient Details  Name: Charles Holmes MRN: [id number] Date of Birth: October 12, 1965   Discharge location: Home  Length of Stay: 20 Days  Discharge activity level: Sup/Min  Home/community participation: Cousin and daughter  Patient response [id number] Literacy - How often do you need to have someone help you when you read instructions, pamphlets, or other written material from your doctor or pharmacy?: Often  Patient response GU:[id number] Isolation - How often do you feel lonely or isolated from those around you?: Never  Services provided included: SW, Neuropsych, Pharmacy, CM, TR, RN, SLP, OT, PT, RD, MD  Financial Services:  Financial Services Utilized: Private Insurance [facility] MEDICARE  Choices offered to/list presented to: Patient  Follow-up services arranged:  Home Health, DME Home Health Agency: [facility] OT SLP RN (IV ABX)    DME : [facility]    Patient response to transportation need: Is the patient able to respond to transportation needs?: Yes In the past 12 months, has lack of transportation kept you from medical appointments or from getting medications?: No In the past 12 months, has lack of transportation kept you from meetings, work, or from getting things needed for daily living?: No   Patient/Family verbalized understanding of follow-up arrangements:  Yes  Individual responsible for coordination of the follow-up plan: self or cousin  Confirmed correct DME delivered: Andria Rhein 10/16/2022    Comments (or additional information):  Summary of Stay    Date/Time Discharge Planning CSW  10/10/22 1537 Patient discharhing home with cousins to provide 24/7 supervision. Daughter able to assist PRN. SW has not confirmed contact with cousins/daughter. IV ABX CJB  10/03/22 1402 discharging home with cousins 24/7. Daughter to assist PRN and IV ABX until 10/19. CJB       Andria Rhein

## 2022-10-16 NOTE — Progress Notes (Signed)
RT came to give AM inhalers however patient stated that he wanted to finish his breakfast and will take them when he takes his other medications.  Will hold at this time.

## 2022-10-17 ENCOUNTER — Encounter (HOSPITAL_COMMUNITY): Payer: Self-pay | Admitting: Unknown Physician Specialty

## 2022-10-17 ENCOUNTER — Other Ambulatory Visit (HOSPITAL_COMMUNITY): Payer: Self-pay

## 2022-10-17 DIAGNOSIS — B952 Enterococcus as the cause of diseases classified elsewhere: Secondary | ICD-10-CM | POA: Diagnosis not present

## 2022-10-17 DIAGNOSIS — R7881 Bacteremia: Secondary | ICD-10-CM | POA: Diagnosis not present

## 2022-10-17 DIAGNOSIS — I428 Other cardiomyopathies: Secondary | ICD-10-CM

## 2022-10-17 LAB — LACTATE DEHYDROGENASE: LDH: 195 U/L — ABNORMAL HIGH (ref 98–192)

## 2022-10-17 LAB — PROTIME-INR
INR: 2.2 — ABNORMAL HIGH (ref 0.8–1.2)
Prothrombin Time: 24.7 s — ABNORMAL HIGH (ref 11.4–15.2)

## 2022-10-17 MED ORDER — ASPIRIN 81 MG PO TBEC: 81.0000 mg | DELAYED_RELEASE_TABLET | Freq: Every day | ORAL | Status: DC

## 2022-10-17 MED ORDER — PANTOPRAZOLE SODIUM 40 MG PO TBEC
40.0000 mg | DELAYED_RELEASE_TABLET | Freq: Every day | ORAL | 0 refills | Status: DC
  Filled 2022-10-17: qty 30, 30d supply, fill #0

## 2022-10-17 MED ORDER — SPIRONOLACTONE 25 MG PO TABS
25.0000 mg | ORAL_TABLET | Freq: Every day | ORAL | 0 refills | Status: DC
  Filled 2022-10-17: qty 30, 30d supply, fill #0

## 2022-10-17 MED ORDER — DIGOXIN 125 MCG PO TABS
0.1250 mg | ORAL_TABLET | Freq: Every day | ORAL | 0 refills | Status: DC
  Filled 2022-10-17: qty 30, 30d supply, fill #0

## 2022-10-17 MED ORDER — WARFARIN SODIUM 3 MG PO TABS
3.0000 mg | ORAL_TABLET | Freq: Once | ORAL | Status: DC
  Filled 2022-10-17: qty 1

## 2022-10-17 MED ORDER — ASPIRIN 81 MG PO TBEC
81.0000 mg | DELAYED_RELEASE_TABLET | Freq: Every day | ORAL | 0 refills | Status: AC
  Filled 2022-10-17: qty 120, 120d supply, fill #0

## 2022-10-17 MED ORDER — WARFARIN SODIUM 1 MG PO TABS
3.0000 mg | ORAL_TABLET | Freq: Every day | ORAL | 1 refills | Status: DC
  Filled 2022-10-17: qty 90, 30d supply, fill #0

## 2022-10-17 MED ORDER — MEXILETINE HCL 250 MG PO CAPS
250.0000 mg | ORAL_CAPSULE | Freq: Two times a day (BID) | ORAL | 0 refills | Status: DC
  Filled 2022-10-17: qty 60, 30d supply, fill #0

## 2022-10-17 MED ORDER — SERTRALINE HCL 50 MG PO TABS
50.0000 mg | ORAL_TABLET | Freq: Every day | ORAL | 0 refills | Status: DC
  Filled 2022-10-17: qty 30, 30d supply, fill #0

## 2022-10-17 MED ORDER — ATORVASTATIN CALCIUM 80 MG PO TABS
80.0000 mg | ORAL_TABLET | Freq: Every day | ORAL | 0 refills | Status: DC
  Filled 2022-10-17: qty 30, 30d supply, fill #0

## 2022-10-17 MED ORDER — ACETAMINOPHEN 325 MG PO TABS: 325.0000 mg | ORAL_TABLET | ORAL | Status: DC | PRN

## 2022-10-17 MED ORDER — SILDENAFIL CITRATE 20 MG PO TABS
20.0000 mg | ORAL_TABLET | Freq: Three times a day (TID) | ORAL | 0 refills | Status: DC
Start: 2022-10-17 — End: 2022-11-13
  Filled 2022-10-17: qty 90, 30d supply, fill #0

## 2022-10-17 NOTE — Plan of Care (Signed)
  Problem: RH Balance Goal: LTG Patient will maintain dynamic standing with ADLs (OT) Description: LTG:  Patient will maintain dynamic standing balance with assist during activities of daily living (OT)  Outcome: Completed/Met   Problem: RH Eating Goal: LTG Patient will perform eating w/assist, cues/equip (OT) Description: LTG: Patient will perform eating with assist, with/without cues using equipment (OT) Outcome: Completed/Met   Problem: RH Grooming Goal: LTG Patient will perform grooming w/assist,cues/equip (OT) Description: LTG: Patient will perform grooming with assist, with/without cues using equipment (OT) Outcome: Completed/Met   Problem: RH Bathing Goal: LTG Patient will bathe all body parts with assist levels (OT) Description: LTG: Patient will bathe all body parts with assist levels (OT) Outcome: Completed/Met   Problem: RH Dressing Goal: LTG Patient will perform upper body dressing (OT) Description: LTG Patient will perform upper body dressing with assist, with/without cues (OT). Outcome: Completed/Met Goal: LTG Patient will perform lower body dressing w/assist (OT) Description: LTG: Patient will perform lower body dressing with assist, with/without cues in positioning using equipment (OT) Outcome: Completed/Met   Problem: RH Toileting Goal: LTG Patient will perform toileting task (3/3 steps) with assistance level (OT) Description: LTG: Patient will perform toileting task (3/3 steps) with assistance level (OT)  Outcome: Completed/Met   Problem: RH Functional Use of Upper Extremity Goal: LTG Patient will use RT/LT upper extremity as a (OT) Description: LTG: Patient will use right/left upper extremity as a stabilizer/gross assist/diminished/nondominant/dominant level with assist, with/without cues during functional activity (OT) Outcome: Completed/Met   Problem: RH Toilet Transfers Goal: LTG Patient will perform toilet transfers w/assist (OT) Description: LTG:  Patient will perform toilet transfers with assist, with/without cues using equipment (OT) Outcome: Completed/Met

## 2022-10-17 NOTE — Progress Notes (Signed)
Speech Language Pathology Discharge Summary  Patient Details  Name: Charles Holmes MRN: [id number] Date of Birth: [birth year]  Date of Discharge from SLP service:October 16, 2022  Patient has met 3 of 3 long term goals.  Patient to discharge at overall Supervision;Modified Independent level.   Reasons goals not met: N/A   Clinical Impression/Discharge Summary: Patient has made functional gains and has met 3 of 3 LTGs this admission.  Currently, patient demonstrates improved cognitive functioning and requires overall supervision-Mod I for recall and problem solving regarding procedures and alarms for LVAD. Patient also demonstrates improved speech intelligibility and is 90-100% intelligible at the sentence level with overall Mod I for use of compensatory strategies. Patient education is complete and patient will discharge home with 24 hour supervision from family. Patient would benefit from f/u SLP services to maximize his cognitive functioning and functional communication in order to reduce caregiver burden.   Care Partner:  Caregiver Able to Provide Assistance: Yes  Type of Caregiver Assistance: Physical;Cognitive  Recommendation:  Home Health SLP;24 hour supervision/assistance  Rationale for SLP Follow Up: Maximize cognitive function and independence;Reduce caregiver burden;Maximize functional communication   Equipment: N/A   Reasons for discharge: Treatment goals met;Discharged from hospital   Patient/Family Agrees with Progress Made and Goals Achieved: Yes    [name] 10/17/2022, 11:01 AM

## 2022-10-17 NOTE — Progress Notes (Signed)
Inpatient Rehabilitation Discharge Medication Review by a Pharmacist  A complete drug regimen review was completed for this patient to identify any potential clinically significant medication issues.  High Risk Drug Classes Is patient taking? Indication by Medication  Antipsychotic No   Anticoagulant Yes Warfarin- LVAD  Antibiotic Yes, as an intravenous medication Ampicillin/ CTX- enterococcus bacteremia in setting of LVAD  Opioid No   Antiplatelet Yes Aspirin- LVAD  Hypoglycemics/insulin No   Vasoactive Medication Yes mexiletine- rhythm control Sildenafil, spironolactone, digoxin- acute on chronic systolic HF- cardiogenic shock  Chemotherapy No   Other Yes Lipitor- HLD Protonix- GERD Zoloft- MDD     Type of Medication Issue Identified Description of Issue Recommendation(s)  Drug Interaction(s) (clinically significant)     Duplicate Therapy     Allergy     No Medication Administration End Date     Incorrect Dose     Additional Drug Therapy Needed     Significant med changes from prior encounter (inform family/care partners about these prior to discharge).    Other       Clinically significant medication issues were identified that warrant physician communication and completion of prescribed/recommended actions by midnight of the next day:  No   Time spent performing this drug regimen review (minutes):  30   Kelcey Wickstrom BS, PharmD, BCPS Clinical Pharmacist 10/17/2022 8:39 AM  Contact: [phone] after 3 PM  "Be curious, not judgmental..." -Debbora Dus

## 2022-10-17 NOTE — Progress Notes (Signed)
Discharge equipment includes:  1. Two system controllers. 2. Mobile Power Unit (MPU) with 20' patient cable 3. One universal Magazine features editor (UBC) 4. Eight fully charged batteries  5. Four battery clips 6. One travel case 7. One holster vest 8. 1 Consolidated bag  8. Wearable accessory package 9. Daily dressing kits, anchors, Aquacel (silver dressing)   VAD Education:    1. Reviewed importance of having a 24 hour caregiver 2. Reviewed dressing change frequency 3. Reviewed when to call the VAD pager and made sure they have phone number in their phone.  4. Reviewed importance of changing one power source at a time 5. Reviewed importance of carrying black emergency bag containing backup controller, 2 batteries, and 2 battery clips, everywhere 6. Reviewed importance of placing mobile power unit (MPU) and batteries on bedside table with a flashlight. Talked about what to do in case of power failure. Reminded to make sure the outlets that equipment is plugged into are not controlled by a light switch.  7. Reviewed importance of using anchors to hold drive line in place, to prevent accidental pulling, or dislodgement of drive line.  8. Patient and family agreed to pick up prescriptions. Stressed importance of taking Warfarin daily in the evening. Stressed importance of taking all prescribed medications as written 9. First clinic visit bring all medications and VAD log.

## 2022-10-17 NOTE — Progress Notes (Addendum)
  Subjective: LUE swelling better , L hand grip still  3/5 Discharge instructions regarding activity limits, wound care and followup reviewed with patient. Objective: Vital signs in last 24 hours: Temp:  [98.4 F (36.9 C)-98.7 F (37.1 C)] 98.4 F (36.9 C) (10/01 0509) Pulse Rate:  [72-83] 77 (10/01 0509) Resp:  [13-18] 16 (10/01 0509) BP: (94-113)/(77-86) 104/79 (10/01 0846) SpO2:  [96 %-97 %] 97 % (10/01 0509) Weight:  [79.2 kg] 79.2 kg (10/01 0509)  Hemodynamic parameters for last 24 hours:  nsr BP stable Intake/Output from previous day: 09/30 0701 - 10/01 0700 In: 1295.1 [P.O.:594; IV Piggyback:701.1] Out: 2125 [Urine:2125] Intake/Output this shift: Total I/O In: -  Out: 200 [Urine:200]  EXAM Alert and oriented VAD parameters satisfactory- Flow 4.5L at 5400 rpm  PI 3.5 Lungs clear, sternotomy healing Normal VAD hum LUE weakness unchanged  Lab Results: Recent Labs    10/16/22 0435  WBC 5.9  HGB 9.3*  HCT 32.4*  PLT 229   BMET:  Recent Labs    10/16/22 0435  NA 140  K 3.8  CL 107  CO2 26  GLUCOSE 85  BUN 10  CREATININE 0.64  CALCIUM 8.4*    PT/INR:  Recent Labs    10/17/22 0343  LABPROT 24.7*  INR 2.2*   ABG    Component Value Date/Time   PHART 7.365 08/31/2022 1504   HCO3 33.2 (H) 09/12/2022 1108   HCO3 31.2 (H) 09/12/2022 1108   TCO2 35 (H) 09/12/2022 1108   TCO2 33 (H) 09/12/2022 1108   ACIDBASEDEF 4.0 (H) 08/31/2022 1504   O2SAT 66.9 09/16/2022 0539   CBG (last 3)  No results for input(s): "GLUCAP" in the last 72 hours.  Assessment/Plan: S/P  Ready for discharge and transition to outpatient care. Importance of avoiding controller pulling on power cord stressed to patient.  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

## 2022-10-17 NOTE — Progress Notes (Cosign Needed)
Patient ID: Charles Holmes, male   DOB: 09/21/1965, 57 y.o.   MRN: [id number]     Advanced Heart Failure Rounding Note  PCP-Cardiologist: Charles Herrlich, MD  [facility]: Dr. Gala Holmes   Subjective:    Admitted to CIR on 09/11    Wants to go home.   LVAD Interrogation HM III: Speed: 5400 Flow: 4.3 PI: 5  Power: 5.  Rare PI events.    aspirin EC  81 mg Oral Daily   atorvastatin  80 mg Oral Daily   Chlorhexidine Gluconate Cloth  6 each Topical BID   digoxin  0.125 mg Oral Daily   dorzolamide-timolol  1 drop Both Eyes BID   Gerhardt's butt cream   Topical TID   influenza vac split trivalent PF  0.5 mL Intramuscular Tomorrow-1000   latanoprost  1 drop Both Eyes QHS   mexiletine  250 mg Oral BID   mometasone-formoterol  2 puff Inhalation BID   multivitamin with minerals  1 tablet Oral Daily   nystatin   Topical BID   pantoprazole  40 mg Oral Daily   sertraline  50 mg Oral Daily   sildenafil  20 mg Oral TID   spironolactone  25 mg Oral Daily   umeclidinium bromide  1 puff Inhalation Daily   warfarin  3 mg Oral ONCE-1600   Warfarin - Pharmacist Dosing Inpatient   Does not apply q1600    Objective:   Weight Range: 79.2 kg Body mass index is 27.35 kg/m.   Vital Signs:   Temp:  [98.4 F (36.9 C)-98.7 F (37.1 C)] 98.4 F (36.9 C) (10/01 0509) Pulse Rate:  [72-83] 77 (10/01 0509) Resp:  [13-18] 16 (10/01 0509) BP: (94-113)/(77-86) 104/79 (10/01 0846) SpO2:  [96 %-97 %] 97 % (10/01 0509) Weight:  [79.2 kg] 79.2 kg (10/01 0509) Last BM Date : 10/16/22  Weight change: Filed Weights   10/14/22 0500 10/15/22 0353 10/17/22 0509  Weight: 76.7 kg 78 kg 79.2 kg   Intake/Output:   Intake/Output Summary (Last 24 hours) at 10/17/2022 0900 Last data filed at 10/17/2022 0500 Gross per 24 hour  Intake 458 ml  Output 2125 ml  Net -1667 ml     Physical Exam  Physical Exam: GENERAL: No acute distress. HEENT: normal  NECK: Supple, JVP  5-6  .  2+ bilaterally, no bruits.  No  lymphadenopathy or thyromegaly appreciated.   CARDIAC:  Mechanical heart sounds with LVAD hum present.  LUNGS:  Clear to auscultation bilaterally.  ABDOMEN:  Soft, round, nontender, positive bowel sounds x4.     LVAD exit site:   Dressing dry and intact.  No erythema or drainage.  Stabilization device present and accurately applied.  EXTREMITIES:  Warm and dry, no cyanosis, clubbing, rash or edema  NEUROLOGIC:  Alert and oriented x 3.    No aphasia.  No dysarthria.  Affect pleasant.         Patient Profile  Charles Holmes is a 57 y.o. 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 Admitted with cardiogenic shock, stabilized and underwent HM3 LVAD. Post implant course c/b acute CVA and enterococcus faecalis bacteremia.   Assessment/Plan  1.  Acute on chronic Systolic HF-->Cardiogenic Shock  - Diagnosed 11/2019. Presented with VT. LHC 70% LAD  - cMRI 12/21 concerning for sarcoid and EF 18%.  - PET 2/22 at [facility] EF 25% + active sarcoid - Medtronic CRT-D upgrade in 06/08/21 - Echo 07/10/22:  EF <20%, RV okay, mod pericardial effusion, mod Charles/TR - Admitted 07/25 with cardiogenic shock. - RHC: Nonobstructive CAD, severely elevated filling pressures and low Fick CO/CI (2.7/1.4) - 08/13 HM III LVAD implant + clipping LAA d/t severe thickening and invagination of mitral valve annulus impeding flows.  - Apical core sent - no mention of sarcoid - Echo 8/26 mod-sev RV dysfunction. Speed increased to 5400 8/26 - Ramp Echo 9/4, speed increased to 5400  - .Volumes status stable.   - On Sildenafil 20 mg tid.  - Continue spironolactone 25mg  daily - Continue digoxin 0.125 mg daily  2. HM-3 LVAD - LDH stable - Continue aspirin with recent stroke.  - On warfarin.   -  INR 2.2   Dosing per PharmD - Caregiver education completed.   3.  Acute stroke - Hx CVA 06/24 -Admitted 06/24 w/ R MCA stroke. S/p TPA and mechanical clot extraction. No residual  deficits. Likely cardioembolic in setting of severe LV dysfunction. - Developed left sided weakness 08/14. CTA with R MCA infarct. Taken to IR for thrombectomy - Repeat CT head with small to moderate size hemorrhagic conversion.  - repeat head CT on 8/17 w/ improved hemorrhagic CVA - On warfarin.  - Functional deficits improving. L hand getting a little stronger.  - Continue ASA 81 mg daily  - Appreciate CIR  4. Enterococcus faecalis bacteremia - Bcx 2/2 on 9/6 - ID consult 9/6 -> ampicillin and ceftriaxone - TEE recommended but doubt it will change management as VAD cannot be removed and will require IV abx followed by long-term suppression - Echo 09/10 - no obvious vegetations -  Repeat BCx 9/8 NGTD. - ID recommending 6 weeks IV amp/ceftriaxone followed by long-term suppression with amoxicillin. Stop date 11/04/22   5. Hx VT - ln setting of potential sarcoid heart disease  - Off amio due to tremor. Continue mexiletine, ICD in place   6. CAD - LHC 12/07/19 70-% LAD, no intervention - LHC 8/24 non obstructive CAD.  - Continue statin. On aspirin for VAD.  - No chest pain.   7. 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   8. Paroxsymal AT/AF -  On Warfarin. INR 2.2   9. Iron deficiency anemia/ Post-op anemia - Has had IV Fe.    10. Pulmonary  - PFTs with severe obstructive defect, response to bronchodilator. FEV1 1.04L, FEV1/FVC 48% - Stable  11. Psych - Depression and delirium post VAD implant. Prolonged hospital course.  - Improving - Continue sertraline  12. Left arm swelling - 9/20 LUE dopplers with chronic superficial vein thrombosis.   VAD Team scheduled follow up appointment for next week. Appreciate CIR Team.   I reviewed the LVAD  parameters from today, and compared the results to the patient's prior recorded data.  No programming changes were made.  The LVAD is functioning within specified parameters.  The patient performs LVAD self-test daily.  LVAD interrogation was negative for any significant power changes, alarms or PI events/speed drops.  LVAD equipment check completed and is in good working order.  Back-up equipment present.   LVAD education done on emergency procedures and precautions and reviewed exit site care.   Bethany Cumming NP-C  9:00 AM  Patient seen with PA/NP, agree with the above note.   Subjective: Feels  great.  In good spirits excited to be discharged home.   Exam: General: NAD HEENT: Normal.  Neck: Thick neck. JVP 5, no thyromegaly or thyroid nodule.  Lungs: Clear to auscultation bilaterally with normal respiratory effort. CV: Normal lvad hum Abdomen: Soft, nontender, no hepatosplenomegaly, no distention.  Skin: Intact without lesions or rashes.  Neurologic: awake/alert, no gross FND.  Psych: Normal affect. Extremities: No clubbing or cyanosis.    A/P -Agree with above.  Has done very well in physical therapy.  His left arm strength and fine motor skills have improved tremendously.  Will follow-up closely in outpatient LVAD clinic.   Aditya Sabharwal Advanced Heart Failure

## 2022-10-17 NOTE — Plan of Care (Signed)
  Problem: RH Expression Communication °Goal: LTG Patient will increase speech intelligibility (SLP) °Description: LTG: Patient will increase speech intelligibility at word/phrase/conversation level with cues, % of the time (SLP) °Outcome: Completed/Met °  °Problem: RH Problem Solving °Goal: LTG Patient will demonstrate problem solving for (SLP) °Description: LTG:  Patient will demonstrate problem solving for basic/complex daily situations with cues  (SLP) °Outcome: Completed/Met °  °Problem: RH Memory °Goal: LTG Patient will use memory compensatory aids to (SLP) °Description: LTG:  Patient will use memory compensatory aids to recall biographical/new, daily complex information with cues (SLP) °Outcome: Completed/Met °  °

## 2022-10-17 NOTE — Progress Notes (Signed)
LVAD Coordinator Rounding Note:  Admitted 08/10/22 due to acute on chronic CHF with cardiogenic shock. Milrinone dependent. Advance therapy workup completed, and pt deemed acceptable VAD candidate. Dental extractions completed 8/6. IABP placed 08/25/22.  HM 3 LVAD implanted on 08/29/22 by Dr Donata Clay under destination therapy criteria. 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 Result negative.  7/25 Admit with cardiogenic shock. Started milrinone and NE. 8/6 S/P 13 teeth extractions  8/9 IABP placed 8/13 S/p HM III LVAD implant + clipping left atrial appendage d/t severe thickening and invagination of mitral valve annulus impeding flows  CT Head 8/14 (initial) 1. 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 ASPECTS is 7. 2. No acute hemorrhage.  CT Angio Head/Neck 1. 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 2. Core infarct of 12 cc (somewhat underestimated compared to aspects) with 90 cc of penumbra. 3. Mild atherosclerosis.  Pt taken emergently to IR for percutaneous right common carotid arteriogram with thrombectomy. Revascularization achieved. Angio-seal closure device applied to left groin- clean, dry, and intact.   CT Head 8/15 @ 0450 Unchanged extent of infarct and hemorrhage in the right MCA distribution including small volume intraventricular clot. No hydrocephalus.  CT Head 8/17 @ 0701 Interval evolution of the right MCA territory infarct with decreased intraparenchymal and intraventricular hemorrhage. No hydrocephalus or midline shift.   Pt walking back from the bathroom with NT on my arrival. No complaints this morning. Discharge planned for today. Pt [name] planning on coming to hospital today around 1-2pm. VAD Coordinator will deliver home equipment at this time and review with pt and caregiver. Pt has followed up in VAD Clinic 10/24/22 with Dr. Gala Romney.  VAD home equipment has arrived. Tentative  discharge date 10/17/22.   Blood cultures from 9/6 positive for Enterococcus Faecalis. Subsequent blood cultures NGTD. ID following recommending 6 weeks of IV Ampicillin and Ceftriaxone followed by chronic suppression with Amoxicillin.   PT/OT/VAD coordinators are working with pt on changing power sources. He is unable to do this independently due to his left sided weakness. Modification of his power cords was made in hopes he can grip the cords better. Has practice power cords for use by pt. He is making progress with therapy, but is still having difficulty with changes. Will need supervision/hands on assistance with power source changes at home caregiver [facility] aware.   Pt may not shower at this time.   Vital signs: Temp: 98.4 HR: 77 Doppler Pressure: 92 Automatic BP: 113/80 (90) O2 Sat: 97% on RA Wt: 183.6>191.1>190.9>190.2>184.8>...>163.1>162.5>171.3>172.4>176.6> 180.6>175.1>174.8>176.3>175.7>177.5>173>171.5>167.3>169.9>160.1>171.9>174.6 lbs   LVAD interrogation reveals:  Speed: 5400 Flow: 4.6 Power: 4.0 w PI: 3.4  Alarms: none Events: 15 PI Events noted today  Hematocrit: 32 Fixed speed: 5400 Low speed limit: 5100  Drive Line:  Existing VAD dressing CDI. Anchor secure. Continue twice a week dressing changes on Monday/Thursday. Next dressing change due 10/19/22 by VAD coordinator or nurse champion only or [employer].   Labs:  LDH trend: 596>441>165>384>345>312>312>350>327>309>316>305>298>277>273>298>280>285>251>252>205>193>202>186>195  INR trend: 1.4>1.6>1.1>1.1>1.3>1.2>1>1.3>1.4>1.5>1.6>2.8>2.8>2.3>2.2>2.3>2.6>2.6>1.7>2>1.8>1.7>2.1>1.8>2.4>2.3>2.0>1.8>2.5>2.2  AST/ALT trend: 218/45>96/30>73/23>63/36>58/37>69/50>93/72>123/103>143/150>117/138>119/138>92/127>87/123>50/71>46/65>46/43  Total Bili trend: 4.4>4.6>5.1>1.8>1.9>1.2>1.0>1.5>1.1>0.8>0.9>1.0>0.8  WBC trend:  11.6>13.7>19.6>16.1>23>26.6>21>24.6>18.1>16.5>13.2>13.1>12.5>11.6>10.5>10.8>13.1>9.5>9.1>9.7>7.8>6.7>6.7>6.4>5.9>5.9  Anticoagulation Plan: -INR Goal: 2.0 - 2.5 -ASA Dose: 81 mg  - Coumadin dosing per pharmacy  Blood Products:  IntraOp 8/14: - 4 FFP - 2 Platelets - 2 PRBC - 1 cyro - 449 cc of cellsaver - DDAVP 20 mcg x 1   Device: Medtronic BiV -Therapies: ON VF ON >207 BPM FVT ON 176-207 BPM VT ON 171-207  BPM  Arrythmias:   Respiratory:   Infection:  09/01/22>> sputum cx>> NGTD>>final 09/01/22>> blood cx>> NGTD>>final 09/01/22>> urine cx>> NGTD>> final 09/13/22>>blood cxs>>no growth 5 days; final 09/22/22>>blood cxs>> Enterococcus faecalis  09/23/22>> blood cxs>> neg final 09/24/22>> blood cxs>> neg final  Renal:  9/23:7/0.71  Adverse Events on VAD: - 08/30/22: - Developed left sided weakness this am. CTA with R MCA infarct. Taken to IR for thrombectomy   Patient Education: Discharge teaching completed with pt's [name] on 9/5. See separate note for documentation.   Plan/Recommendations:  Please page VAD coordinator for any alarms or VAD equipment issues. Continue twice a week dressing changes on Monday/Thursday by VAD coordinator, Nurse Alla Feeling, or [name].  Simmie Davies RN,BSN VAD Coordinator  Office: [phone]  24/7 Pager: [phone]

## 2022-10-17 NOTE — Plan of Care (Signed)
  Problem: RH Balance Goal: LTG Patient will maintain dynamic standing balance (PT) Description: LTG:  Patient will maintain dynamic standing balance with assistance during mobility activities (PT) Outcome: Completed/Met Flowsheets (Taken 10/17/2022 1647) LTG: Pt will maintain dynamic standing balance during mobility activities with:: Supervision/Verbal cueing   Problem: Sit to Stand Goal: LTG:  Patient will perform sit to stand with assistance level (PT) Description: LTG:  Patient will perform sit to stand with assistance level (PT) Outcome: Completed/Met Flowsheets (Taken 09/28/2022 1543 by Truitt Leep, PT) LTG: PT will perform sit to stand in preparation for functional mobility with assistance level: Supervision/Verbal cueing   Problem: RH Bed Mobility Goal: LTG Patient will perform bed mobility with assist (PT) Description: LTG: Patient will perform bed mobility with assistance, with/without cues (PT). Outcome: Completed/Met Flowsheets (Taken 10/17/2022 1647) LTG: Pt will perform bed mobility with assistance level of: Independent with assistive device    Problem: RH Car Transfers Goal: LTG Patient will perform car transfers with assist (PT) Description: LTG: Patient will perform car transfers with assistance (PT). Outcome: Completed/Met Flowsheets (Taken 10/17/2022 1647) LTG: Pt will perform car transfers with assist:: Contact Guard/Touching assist   Problem: RH Ambulation Goal: LTG Patient will ambulate in home environment (PT) Description: LTG: Patient will ambulate in home environment, # of feet with assistance (PT). Outcome: Completed/Met Flowsheets Taken 10/17/2022 1647 by Loel Dubonnet, PT LTG: Pt will ambulate in home environ  assist needed:: Supervision/Verbal cueing Taken 09/28/2022 1543 by Truitt Leep, PT LTG: Ambulation distance in home environment: 50 ft with LRAD Note: Reaches at least 50 ft with no AD and at most 600 ft with use of IV pole.     Problem:  RH Stairs Goal: LTG Patient will ambulate up and down stairs w/assist (PT) Description: LTG: Patient will ambulate up and down # of stairs with assistance (PT) Outcome: Completed/Met Flowsheets Taken 10/17/2022 1647 by Sharol Harness A, PT LTG: Pt will ambulate up/down stairs assist needed:: Contact Guard/Touching assist Taken 09/28/2022 1543 by Truitt Leep, PT LTG: Pt will  ambulate up and down number of stairs: 1 step

## 2022-10-17 NOTE — Progress Notes (Signed)
ANTICOAGULATION CONSULT NOTE - Follow-up  Pharmacy Consult for warfarin Indication:  LVAD  Allergies  Allergen Reactions   Pacerone [Amiodarone] Other (See Comments)    Severe tremors   Percocet [Oxycodone-Acetaminophen] Itching    Patient Measurements: Height: 5\' 7"  (170.2 cm) Weight: 79.2 kg (174 lb 9.7 oz) IBW/kg (Calculated) : 66.1 Heparin Dosing Weight: 87kg  Vital Signs: Temp: 98.4 F (36.9 C) (10/01 0509) Temp Source: Oral (10/01 0509) BP: 113/80 (10/01 0509) Pulse Rate: 77 (10/01 0509)  Labs: Recent Labs    10/15/22 0343 10/16/22 0435 10/17/22 0343  HGB  --  9.3*  --   HCT  --  32.4*  --   PLT  --  229  --   LABPROT 22.7* 26.9* 24.7*  INR 2.0* 2.5* 2.2*  CREATININE  --  0.64  --     Estimated Creatinine Clearance: 93 mL/min (by C-G formula based on SCr of 0.64 mg/dL).   Medical History: Past Medical History:  Diagnosis Date   CAD (coronary artery disease)    CHF (congestive heart failure) (HCC)    COPD (chronic obstructive pulmonary disease) (HCC)    GERD (gastroesophageal reflux disease)    Hyperlipidemia    Hypertension    LVAD (left ventricular assist device) present (HCC)    Stroke (HCC)    Systolic heart failure (HCC) 2021   LVEF 18%, RVEF 38% on cardiac MRI 12/19/2019. possible cardiac sarcoidosis.   Wide-complex tachycardia 2021   wears LifeVest     Assessment: 57 yoM on apixaban PTA for hx AF admitted for LVAD workup. Pt s/p HM3 implant on 8/13 c/b acute CVA postop, on aspirin 81 mg daily. Started low dose heparin + warfarin started 8/19. Low-dose heparin was stopped 8/31.  INR today is therapeutic at 2.2. Hgb 9.3, plt 229 yesterday. LDH 195. Oral intake documented at 100%.   Goal of Therapy:  INR 2-2.5 Monitor platelets by anticoagulation protocol: Yes   Plan:  Warfarin 3 mg pox 1 today Would discharge home on warfarin 3 mg with INR check this week  Monitor daily INR, CBC  Thank you for allowing pharmacy to participate in this  patient's care,  Sherron Monday, PharmD, BCCCP Clinical Pharmacist  Phone: [phone] 10/17/2022 7:33 AM  Please check AMION for all [facility] Pharmacy phone numbers After 10:00 PM, call Main Pharmacy [phone]

## 2022-10-17 NOTE — Progress Notes (Signed)
Physical Therapy Session Note  Patient Details  Name: Charles Holmes MRN: [id number] Date of Birth: 1965/10/02  Today's Date: 10/17/2022 PT Individual Time:  -      Short Term Goals: Week 1:  PT Short Term Goal 1 (Week 1): Pt will require min A consisntely for bed mobility PT Short Term Goal 1 - Progress (Week 1): Met PT Short Term Goal 2 (Week 1): Pt will require mod A with sit to stand with LRAD PT Short Term Goal 2 - Progress (Week 1): Met PT Short Term Goal 3 (Week 1): Pt will require max A x 1 for gait x 10 ft with LRAD PT Short Term Goal 3 - Progress (Week 1): Met Week 2:  PT Short Term Goal 1 (Week 2): Pt will require consistent supervision for bed mobility. PT Short Term Goal 1 - Progress (Week 2): Met PT Short Term Goal 2 (Week 2): Pt will require consistent CGA for sit to stand with LRAD. PT Short Term Goal 2 - Progress (Week 2): Met PT Short Term Goal 3 (Week 2): Pt will require supervision for ambulation >100 ft with LRAD and no rest breaks. PT Short Term Goal 3 - Progress (Week 2): Met   Skilled Therapeutic Interventions/Progress Updates:  Patient seated upright in recliner on entrance to room. Patient alert and agreeable to PT session.   Patient with no pain complaint at start of session.  While seated in recliner, pt is already on battery power with vest donned. With ModA is able to don shoes, but requires TotA to tie shoes.   Therapeutic Activity: Transfers: Pt performed sit<>stand and stand pivot transfers throughout session with distant supervision. No vc required for technique.   Gait Training:  Pt ambulated to/ from main gym using IV pole and supervision. Increase in lateral sway but no LOB throughout. Is able to manage pole to R side without difficulty.  Pt guided in and is able to complete with slow, consistent pace and reaching 644ft. Relates fatigue at end and did push himself to finish.   On return to room, assisted pt with change from battery back  to base unit with MaxA for time.   Patient supine in bed at end of session with brakes locked, bed alarm set, and all needs within reach.   Therapy Documentation Precautions:  Precautions Precautions: Sternal, Fall, Other (comment) Precaution Comments: LVAD, L hemipareisis UE>LE Restrictions Weight Bearing Restrictions: No RUE Weight Bearing:  (sternal precaution) LUE Weight Bearing:  (sternal precaution) RLE Weight Bearing: Weight bearing as tolerated Other Position/Activity Restrictions: Sternal Precautions  Pain:  No pain related this session.   Therapy/Group: Individual Therapy  Loel Dubonnet PT, DPT, CSRS 10/17/2022, 6:23 AM

## 2022-10-18 ENCOUNTER — Other Ambulatory Visit: Payer: Self-pay

## 2022-10-18 ENCOUNTER — Telehealth: Payer: Self-pay

## 2022-10-18 ENCOUNTER — Ambulatory Visit: Payer: Medicare Other | Admitting: Internal Medicine

## 2022-10-18 ENCOUNTER — Encounter: Payer: Self-pay | Admitting: Internal Medicine

## 2022-10-18 VITALS — BP 101/93 | HR 195 | Resp 16 | Ht 67.0 in | Wt 164.0 lb

## 2022-10-18 DIAGNOSIS — R7881 Bacteremia: Secondary | ICD-10-CM | POA: Diagnosis not present

## 2022-10-18 DIAGNOSIS — T827XXD Infection and inflammatory reaction due to other cardiac and vascular devices, implants and grafts, subsequent encounter: Secondary | ICD-10-CM

## 2022-10-18 MED ORDER — AMOXICILLIN 500 MG PO TABS: 1000.0000 mg | ORAL_TABLET | Freq: Two times a day (BID) | ORAL | 5 refills | Status: DC

## 2022-10-18 NOTE — Patient Instructions (Signed)
Remove picc planned for 10/19 when you should finish ampicillin and ceftriaxone   Pick up amoxicillin today; on 10/20 start taking 1000 mg twice a day (2 tabs in morning, and 2 tabs at night)   See me in about 5-6 weeks

## 2022-10-18 NOTE — Progress Notes (Signed)
[facility]  Patient Active Problem List   Diagnosis Date Noted   Hypotension due to drugs 10/10/2022   Acute blood loss anemia 10/10/2022   LVAD (left ventricular assist device) present (HCC) 10/05/2022   Acute right MCA stroke (HCC) 09/27/2022   Bacteremia due to Enterococcus 09/25/2022   Middle cerebral artery embolism, 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   Chronic obstructive pulmonary disease (HCC) 08/23/2022   History of CVA (cerebrovascular accident) 08/11/2022   Paroxysmal atrial fibrillation (HCC) 08/11/2022   Iron deficiency anemia 08/11/2022   CHF (congestive heart failure) (HCC) 08/10/2022   Stroke (cerebrum) (HCC) 07/11/2022   Status post surgery 07/10/2022   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   Coronary artery disease involving native coronary artery of native heart without angina pectoris 03/02/2020   Chronic systolic heart failure (HCC) 02/21/2020   Ventricular tachycardia (HCC) 02/19/2020   Ventricular tachycardia, sustained (HCC) 02/19/2020   AKI (acute kidney injury) (HCC) 12/19/2019   CAD (coronary artery disease) 12/19/2019   Hyperlipidemia with target LDL less than 70 12/19/2019   Wide-complex tachycardia 12/16/2019   Acute on chronic systolic CHF (congestive heart failure) (HCC) 12/16/2019   HTN (hypertension) 12/16/2019      Subjective:    Patient ID: Charles Holmes, male    DOB: [birth year], 57 y.o.   MRN: [id number]  Chief Complaint  Patient presents with   Hospitalization Follow-up    HPI:  Charles Holmes is a 57 y.o. male lvad patient with icd here for hospital f/u e faecalis bacteremia  Patient had pacer/lvad. Cardiology/ep evaluated and given no planned surgery in setting lvad on tee done and plan to treat through then suppress with abx  9/6 bcx positive and cleared on 9/7 and 9/8  He had stroke during admission too and had left sided weakness although that is better  No n/v/diarrhea/rash  Picc rue side  functioning  No complaint   Allergies  Allergen Reactions   Pacerone [Amiodarone] Other (See Comments)    Severe tremors   Percocet [Oxycodone-Acetaminophen] Itching      Outpatient Medications Prior to Visit  Medication Sig Dispense Refill   acetaminophen (TYLENOL) 325 MG tablet Take 1-2 tablets (325-650 mg total) by mouth every 4 (four) hours as needed for mild pain.     albuterol (PROVENTIL) (2.5 MG/3ML) 0.083% nebulizer solution Take 3 mLs (2.5 mg total) by nebulization every 4 (four) hours as needed for wheezing or shortness of breath. 75 mL 12   ampicillin IVPB Inject 12 g into the vein daily. As a continuous infusion Indication:  Enterocccus bacteremia in the setting of an LVAD First Dose: No Last Day of Therapy:  11/04/2022 Labs - Once weekly:  CBC/D and BMP, Labs - Once weekly: ESR and CRP Method of administration: Ambulatory Pump (Continuous Infusion) Method of administration may be changed at the discretion of home infusion pharmacist based upon assessment of the patient and/or caregiver's ability to self-administer the medication ordered. 32 Units 0   aspirin EC 81 MG tablet Take 1 tablet (81 mg total) by mouth daily. Swallow whole. 120 tablet 0   atorvastatin (LIPITOR) 80 MG tablet Take 1 tablet (80 mg total) by mouth daily. 30 tablet 0   cefTRIAXone (ROCEPHIN) IVPB Inject 2 g into the vein every 12 (twelve) hours. Indication:  Enterococcus bacteremia in the setting of LVAD  First Dose: No Last Day of Therapy:  11/04/22 Labs - Once weekly:  CBC/D and BMP, Labs - Once weekly: ESR  and CRP Method of administration: IV Push Method of administration may be changed at the discretion of home infusion pharmacist based upon assessment of the patient and/or caregiver's ability to self-administer the medication ordered. 64 Units 0   digoxin (LANOXIN) 0.125 MG tablet Take 1 tablet (0.125 mg total) by mouth daily. 30 tablet 0   dorzolamide-timolol (COSOPT) 22.3-6.8 MG/ML  ophthalmic solution Place 1 drop into both eyes in the morning and at bedtime.     fluticasone-salmeterol (ADVAIR HFA) 230-21 MCG/ACT inhaler Inhale 2 puffs into the lungs 2 (two) times daily. 1 each 12   latanoprost (XALATAN) 0.005 % ophthalmic solution Place 1 drop into both eyes at bedtime.     mexiletine (MEXITIL) 250 MG capsule Take 1 capsule (250 mg total) by mouth 2 (two) times daily. 60 capsule 0   Multiple Vitamin (MULTIVITAMIN WITH MINERALS) TABS tablet Take 1 tablet by mouth daily.     pantoprazole (PROTONIX) 40 MG tablet Take 1 tablet (40 mg total) by mouth daily before breakfast. 30 tablet 0   sertraline (ZOLOFT) 50 MG tablet Take 1 tablet (50 mg total) by mouth daily. 30 tablet 0   sildenafil (REVATIO) 20 MG tablet Take 1 tablet (20 mg total) by mouth 3 (three) times daily. 90 tablet 0   spironolactone (ALDACTONE) 25 MG tablet Take 1 tablet (25 mg total) by mouth daily. 30 tablet 0   warfarin (COUMADIN) 1 MG tablet Take 3 tablets (3 mg total) by mouth daily at 4 PM. 90 tablet 1   No facility-administered medications prior to visit.     Social History   Socioeconomic History   Marital status: Divorced    Spouse name: Not on file   Number of children: Not on file   Years of education: Not on file   Highest education level: Not on file  Occupational History   Not on file  Tobacco Use   Smoking status: Former    Types: Cigarettes   Smokeless tobacco: Never  Vaping Use   Vaping status: Unknown  Substance and Sexual Activity   Alcohol use: Yes    Alcohol/week: 6.0 standard drinks of alcohol    Types: 6 Cans of beer per week   Drug use: Yes    Types: Marijuana    Comment: stopped using months ago d/t how it affected breathing   Sexual activity: Yes  Other Topics Concern   Not on file  Social History Narrative   Not on file   Social Determinants of Health   Financial Resource Strain: Low Risk  (07/12/2022)   Overall Financial Resource Strain (CARDIA)    Difficulty  of Paying Living Expenses: Not hard at all  Food Insecurity: No Food Insecurity (08/13/2022)   Hunger Vital Sign    Worried About Running Out of Food in the Last Year: Never true    Ran Out of Food in the Last Year: Never true  Transportation Needs: No Transportation Needs (08/13/2022)   PRAPARE - Administrator, Civil Service (Medical): No    Lack of Transportation (Non-Medical): No  Physical Activity: Not on file  Stress: Not on file  Social Connections: Not on file  Intimate Partner Violence: Not At Risk (08/13/2022)   Humiliation, Afraid, Rape, and Kick questionnaire    Fear of Current or Ex-Partner: No    Emotionally Abused: No    Physically Abused: No    Sexually Abused: No      Review of Systems     Objective:  BP (!) 101/93   Pulse (!) 195   Resp 16   Ht 5\' 7"  (1.702 m)   Wt 164 lb (74.4 kg)   SpO2 92%   BMI 25.69 kg/m  Nursing note and vital signs reviewed.  Physical Exam     General/constitutional: no distress, pleasant HEENT: Normocephalic, PER, Conj Clear, EOMI, Oropharynx clear Neck supple CV: lvad hum; no lower ext edema; no jvd Lungs: clear to auscultation, normal respiratory effort Abd: Soft, Nontender Ext: no edema Skin: No Rash Neuro: left sided mild weakness in leg MSK: no peripheral joint swelling/tenderness/warmth; back spines nontender   Central line presence: rue picc site no erythema/purulence/tenderness   Labs: Lab Results  Component Value Date   WBC 5.9 10/16/2022   HGB 9.3 (L) 10/16/2022   HCT 32.4 (L) 10/16/2022   MCV 92.6 10/16/2022   PLT 229 10/16/2022   Last metabolic panel Lab Results  Component Value Date   GLUCOSE 85 10/16/2022   NA 140 10/16/2022   K 3.8 10/16/2022   CL 107 10/16/2022   CO2 26 10/16/2022   BUN 10 10/16/2022   CREATININE 0.64 10/16/2022   GFRNONAA >60 10/16/2022   CALCIUM 8.4 (L) 10/16/2022   PHOS 2.9 09/05/2022   PROT 6.1 (L) 09/28/2022   ALBUMIN 2.4 (L) 09/28/2022   BILITOT  0.8 09/28/2022   ALKPHOS 101 09/28/2022   AST 46 (H) 09/28/2022   ALT 43 09/28/2022   ANIONGAP 7 10/16/2022   No results found for: "CRP"  Micro:  Serology:  Imaging:  Assessment & Plan:   Problem List Items Addressed This Visit   None Visit Diagnoses     Bacteremia    -  Primary   Pacemaker infection, subsequent encounter             No orders of the defined types were placed in this encounter.    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 This is reasonable and agree he'll need long term suppression after that     6 wk amp/ceftriaxone followed by chronic amoxicillin suppression     --------------- 10/18/2022 id clinic f/u Patient released yesterday Patient much improved since admission; left arm still weak from stroke No dyspnea cough or leg swelling No diarrhea No fever chill Eating well and good appetite   He has until 10/19 with iv ceftriaxone/ampicillin then to be transitioned to amoxicillin 1 gram bid chronic suppression  Picc on rue can be removed on 10/19  Labs reviewed       Follow-up: Return in about 5 weeks (around 11/22/2022).      Raymondo Band, MD [facility] 10/18/2022, 2:56 PM

## 2022-10-18 NOTE — Telephone Encounter (Signed)
  Per provider ok to PULL PICC after End Date.   Provider:Trung Vu,MD End Date:11/04/2022   Notified [facility] and [facility].

## 2022-10-19 DIAGNOSIS — Z7951 Long term (current) use of inhaled steroids: Secondary | ICD-10-CM | POA: Diagnosis not present

## 2022-10-19 DIAGNOSIS — E119 Type 2 diabetes mellitus without complications: Secondary | ICD-10-CM | POA: Diagnosis not present

## 2022-10-19 DIAGNOSIS — D62 Acute posthemorrhagic anemia: Secondary | ICD-10-CM | POA: Diagnosis not present

## 2022-10-19 DIAGNOSIS — J449 Chronic obstructive pulmonary disease, unspecified: Secondary | ICD-10-CM | POA: Diagnosis not present

## 2022-10-19 DIAGNOSIS — I82712 Chronic embolism and thrombosis of superficial veins of left upper extremity: Secondary | ICD-10-CM | POA: Diagnosis not present

## 2022-10-19 DIAGNOSIS — I480 Paroxysmal atrial fibrillation: Secondary | ICD-10-CM | POA: Diagnosis not present

## 2022-10-19 DIAGNOSIS — Z7901 Long term (current) use of anticoagulants: Secondary | ICD-10-CM | POA: Diagnosis not present

## 2022-10-19 DIAGNOSIS — Z8673 Personal history of transient ischemic attack (TIA), and cerebral infarction without residual deficits: Secondary | ICD-10-CM | POA: Diagnosis not present

## 2022-10-19 DIAGNOSIS — R7881 Bacteremia: Secondary | ICD-10-CM | POA: Diagnosis not present

## 2022-10-19 DIAGNOSIS — Z452 Encounter for adjustment and management of vascular access device: Secondary | ICD-10-CM | POA: Diagnosis not present

## 2022-10-19 DIAGNOSIS — Z7982 Long term (current) use of aspirin: Secondary | ICD-10-CM | POA: Diagnosis not present

## 2022-10-19 DIAGNOSIS — Z792 Long term (current) use of antibiotics: Secondary | ICD-10-CM | POA: Diagnosis not present

## 2022-10-19 DIAGNOSIS — Z95811 Presence of heart assist device: Secondary | ICD-10-CM | POA: Diagnosis not present

## 2022-10-19 DIAGNOSIS — B952 Enterococcus as the cause of diseases classified elsewhere: Secondary | ICD-10-CM | POA: Diagnosis not present

## 2022-10-19 DIAGNOSIS — G4733 Obstructive sleep apnea (adult) (pediatric): Secondary | ICD-10-CM | POA: Diagnosis not present

## 2022-10-19 DIAGNOSIS — I952 Hypotension due to drugs: Secondary | ICD-10-CM | POA: Diagnosis not present

## 2022-10-19 DIAGNOSIS — E785 Hyperlipidemia, unspecified: Secondary | ICD-10-CM | POA: Diagnosis not present

## 2022-10-19 DIAGNOSIS — G4700 Insomnia, unspecified: Secondary | ICD-10-CM | POA: Diagnosis not present

## 2022-10-19 DIAGNOSIS — D509 Iron deficiency anemia, unspecified: Secondary | ICD-10-CM | POA: Diagnosis not present

## 2022-10-19 DIAGNOSIS — I2510 Atherosclerotic heart disease of native coronary artery without angina pectoris: Secondary | ICD-10-CM | POA: Diagnosis not present

## 2022-10-19 DIAGNOSIS — I110 Hypertensive heart disease with heart failure: Secondary | ICD-10-CM | POA: Diagnosis not present

## 2022-10-19 DIAGNOSIS — I5023 Acute on chronic systolic (congestive) heart failure: Secondary | ICD-10-CM | POA: Diagnosis not present

## 2022-10-19 IMAGING — CT CT CHEST HIGH RESOLUTION W/O CM
2 of 7 series · 14 of 36 positions shown, 17 images · non-contrast
Comparison: 09/22/2014

CLINICAL DATA: Cardiac sarcoidosis suspected

EXAM:
CT CHEST WITHOUT CONTRAST
TECHNIQUE: Multidetector CT imaging of the chest was performed following the
standard protocol without intravenous contrast. High resolution
imaging of the lungs, as well as inspiratory and expiratory imaging,
was performed.

[Series 4: high resolution · axial · 0.69mm/px · z∈[-320,-66]mm · 11 of 305 slices shown, 14 images]
[im 26/305  mediastinal]
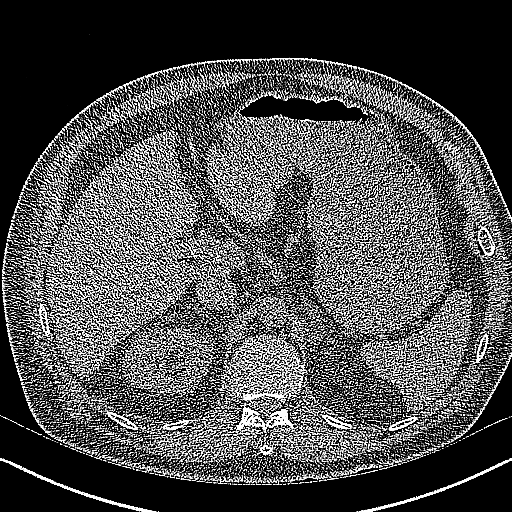
[im 26/305  lung]
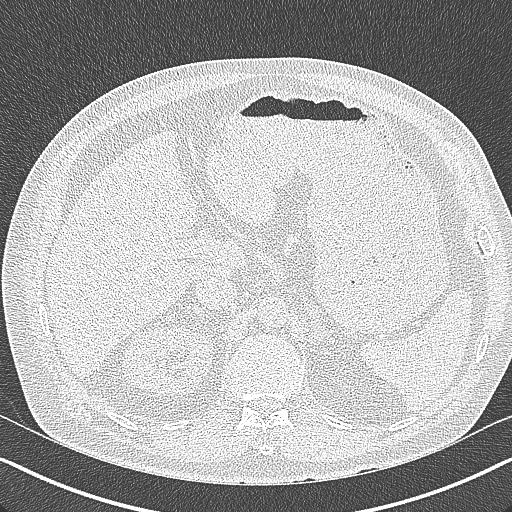
[im 51/305  lung]
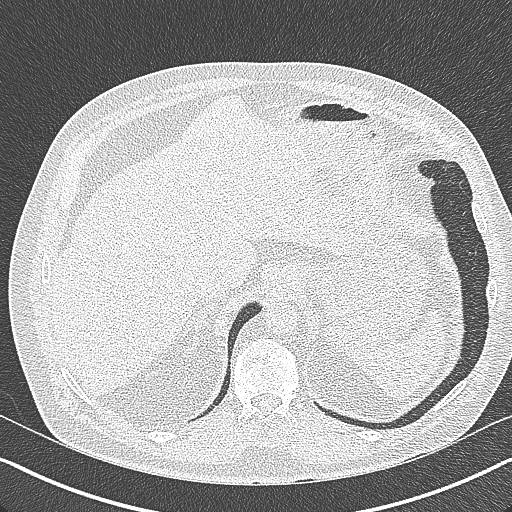
[im 77/305  lung]
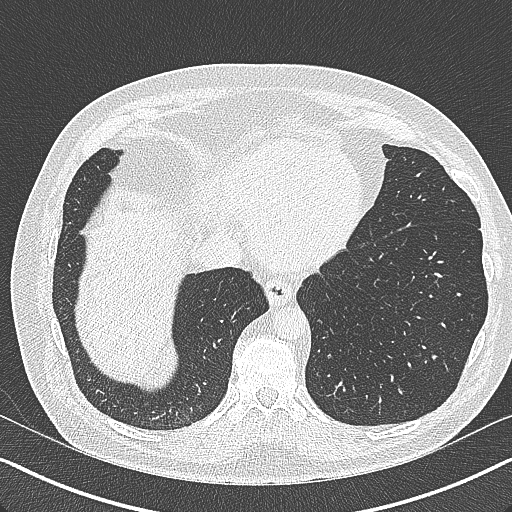
[im 102/305  lung]
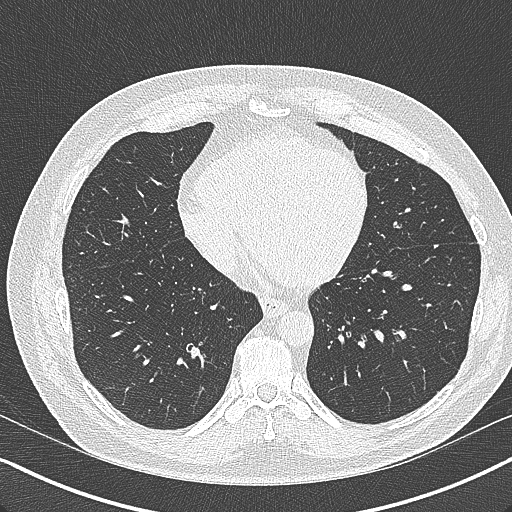
[im 127/305  mediastinal]
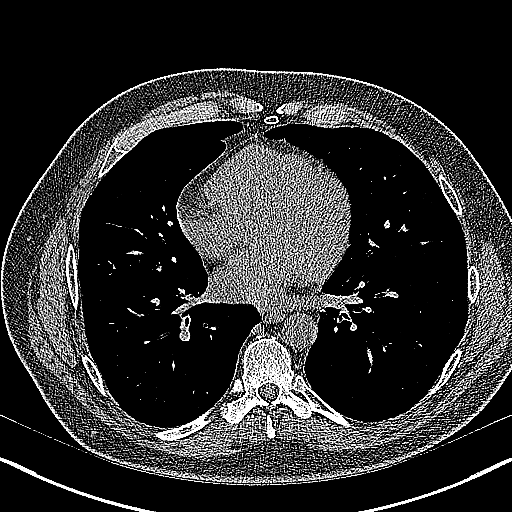
[im 127/305  lung]
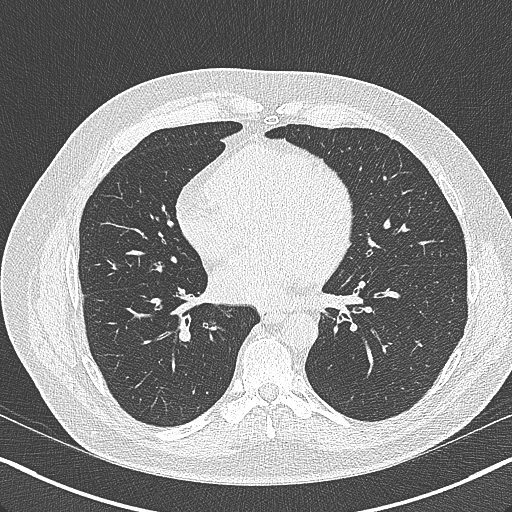
[im 153/305  lung]
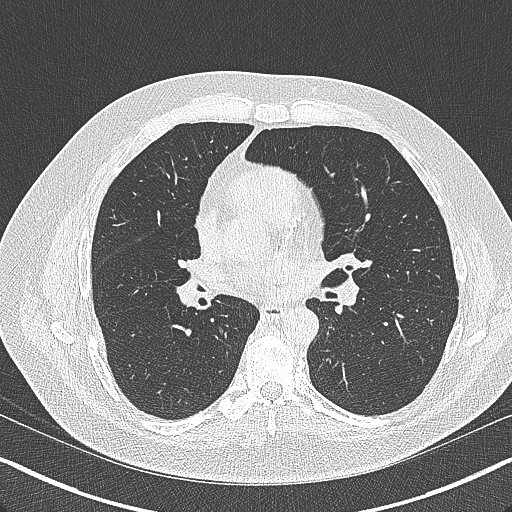
[im 178/305  lung]
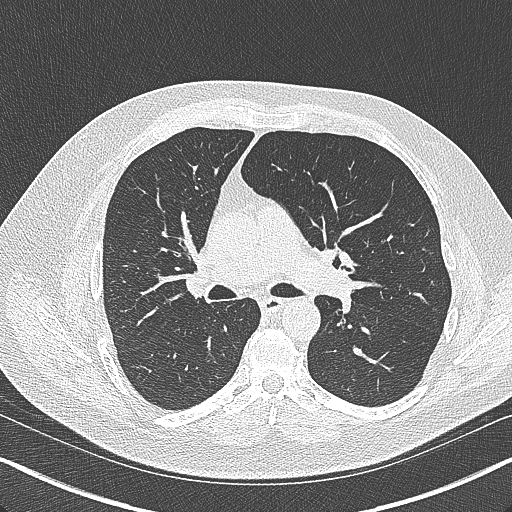
[im 203/305  lung]
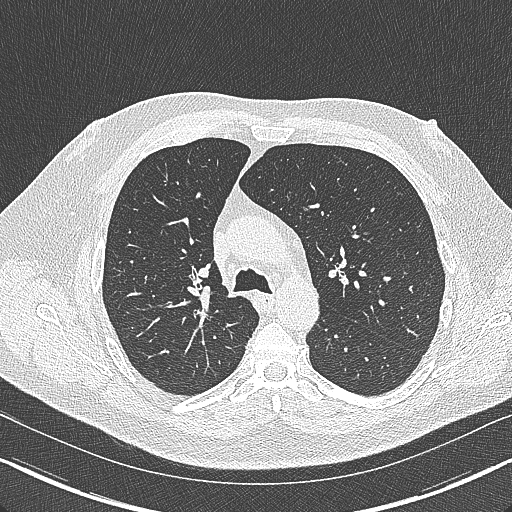
[im 229/305  mediastinal]
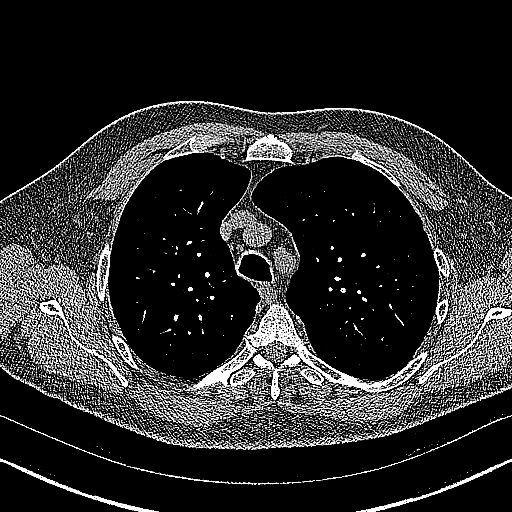
[im 229/305  lung]
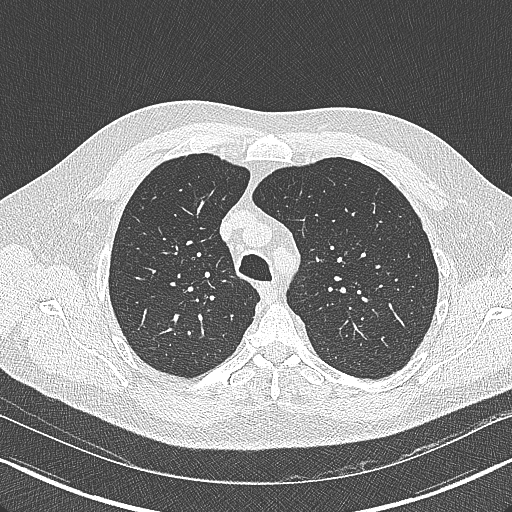
[im 254/305  lung]
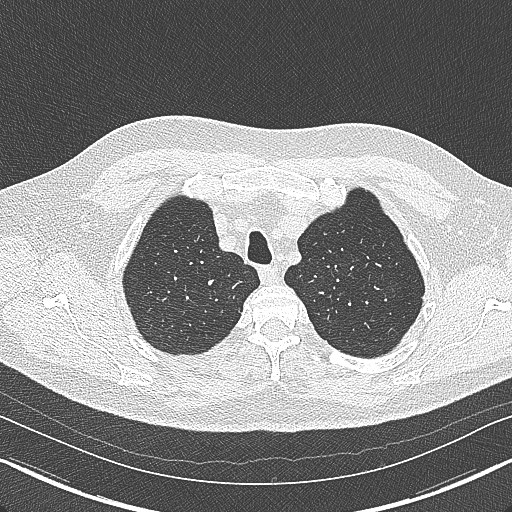
[im 279/305  lung]
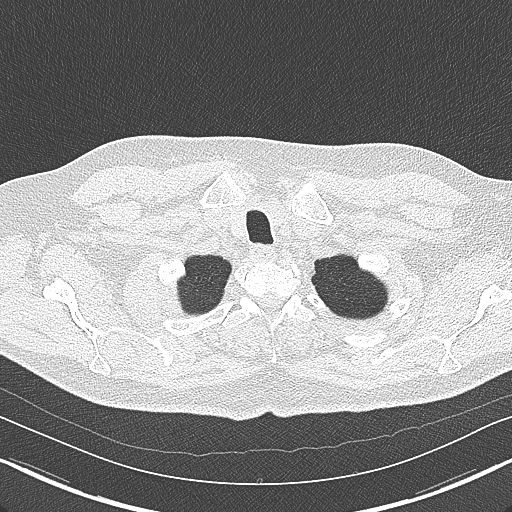

[Series 6: coronal · coronal · 0.62mm/px · 3 of 129 slices shown]
[im 26/129  lung]
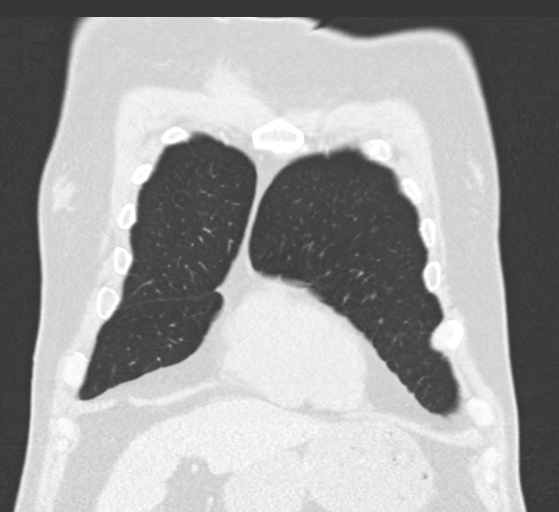
[im 52/129  lung]
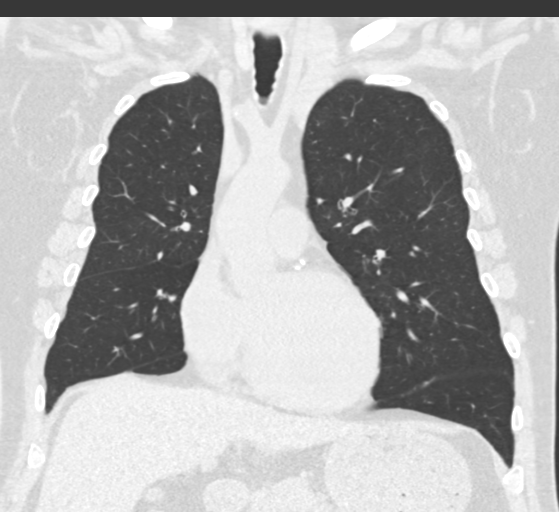
[im 77/129  lung]
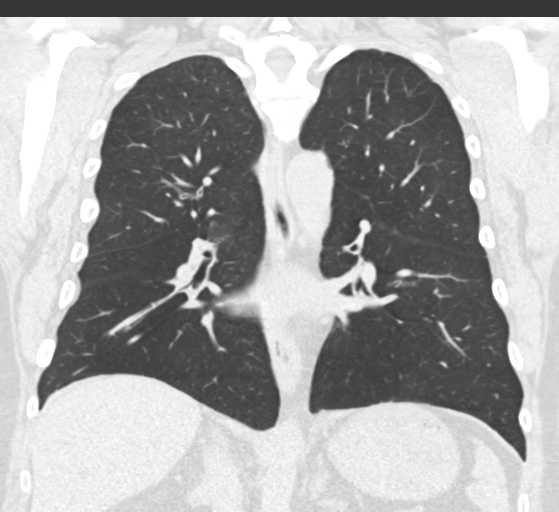

[14 of 36 positions shown; findings below may reference images not displayed]

FINDINGS: Cardiovascular: Left and right coronary artery calcifications.
Normal heart size. No pericardial effusion.

Mediastinum/Nodes: No enlarged mediastinal, hilar, or axillary lymph
nodes. Thyroid gland, trachea, and esophagus demonstrate no
significant findings.

Lungs/Pleura: Diffuse bilateral bronchial wall thickening. There is
minimal fine centrilobular nodularity scattered throughout the lungs
and very minimal suggestion of very fine perilymphatic nodularity
noted along the fissures (e.g. series 4, image 186). There are
stable small benign small fissural nodules along the left major
fissure. No significant air trapping on expiratory phase image no
pleural effusion or pneumothorax.

Upper Abdomen: No acute abnormality.

Musculoskeletal: No chest wall mass or suspicious bone lesions
identified. Chronic fracture deformities of the right ribs.
IMPRESSION: 1. Diffuse bilateral bronchial wall thickening, consistent with
nonspecific infectious or inflammatory bronchitis.
2. There is minimal fine centrilobular nodularity scattered
throughout the lungs and very minimal suggestion of very fine
perilymphatic nodularity noted along the fissures. There are no
definitive findings of mediastinal or pulmonary sarcoidosis at this
time, although if the diagnosis of cardiac sarcoidosis is
established, consider CT follow-up to assess for change over time.
3. No other evidence of fibrotic interstitial lung disease.
4. Coronary artery disease.

## 2022-10-21 DIAGNOSIS — B952 Enterococcus as the cause of diseases classified elsewhere: Secondary | ICD-10-CM | POA: Diagnosis not present

## 2022-10-21 DIAGNOSIS — R7881 Bacteremia: Secondary | ICD-10-CM | POA: Diagnosis not present

## 2022-10-23 DIAGNOSIS — I110 Hypertensive heart disease with heart failure: Secondary | ICD-10-CM | POA: Diagnosis not present

## 2022-10-23 DIAGNOSIS — Z7951 Long term (current) use of inhaled steroids: Secondary | ICD-10-CM | POA: Diagnosis not present

## 2022-10-23 DIAGNOSIS — I480 Paroxysmal atrial fibrillation: Secondary | ICD-10-CM | POA: Diagnosis not present

## 2022-10-23 DIAGNOSIS — Z792 Long term (current) use of antibiotics: Secondary | ICD-10-CM | POA: Diagnosis not present

## 2022-10-23 DIAGNOSIS — Z95811 Presence of heart assist device: Secondary | ICD-10-CM | POA: Diagnosis not present

## 2022-10-23 DIAGNOSIS — I82712 Chronic embolism and thrombosis of superficial veins of left upper extremity: Secondary | ICD-10-CM | POA: Diagnosis not present

## 2022-10-23 DIAGNOSIS — G4733 Obstructive sleep apnea (adult) (pediatric): Secondary | ICD-10-CM | POA: Diagnosis not present

## 2022-10-23 DIAGNOSIS — Z8673 Personal history of transient ischemic attack (TIA), and cerebral infarction without residual deficits: Secondary | ICD-10-CM | POA: Diagnosis not present

## 2022-10-23 DIAGNOSIS — J449 Chronic obstructive pulmonary disease, unspecified: Secondary | ICD-10-CM | POA: Diagnosis not present

## 2022-10-23 DIAGNOSIS — E119 Type 2 diabetes mellitus without complications: Secondary | ICD-10-CM | POA: Diagnosis not present

## 2022-10-23 DIAGNOSIS — I2510 Atherosclerotic heart disease of native coronary artery without angina pectoris: Secondary | ICD-10-CM | POA: Diagnosis not present

## 2022-10-23 DIAGNOSIS — Z452 Encounter for adjustment and management of vascular access device: Secondary | ICD-10-CM | POA: Diagnosis not present

## 2022-10-23 DIAGNOSIS — G4700 Insomnia, unspecified: Secondary | ICD-10-CM | POA: Diagnosis not present

## 2022-10-23 DIAGNOSIS — I952 Hypotension due to drugs: Secondary | ICD-10-CM | POA: Diagnosis not present

## 2022-10-23 DIAGNOSIS — D509 Iron deficiency anemia, unspecified: Secondary | ICD-10-CM | POA: Diagnosis not present

## 2022-10-23 DIAGNOSIS — D62 Acute posthemorrhagic anemia: Secondary | ICD-10-CM | POA: Diagnosis not present

## 2022-10-23 DIAGNOSIS — E785 Hyperlipidemia, unspecified: Secondary | ICD-10-CM | POA: Diagnosis not present

## 2022-10-23 DIAGNOSIS — B952 Enterococcus as the cause of diseases classified elsewhere: Secondary | ICD-10-CM | POA: Diagnosis not present

## 2022-10-23 DIAGNOSIS — Z7982 Long term (current) use of aspirin: Secondary | ICD-10-CM | POA: Diagnosis not present

## 2022-10-23 DIAGNOSIS — I5023 Acute on chronic systolic (congestive) heart failure: Secondary | ICD-10-CM | POA: Diagnosis not present

## 2022-10-23 DIAGNOSIS — Z7901 Long term (current) use of anticoagulants: Secondary | ICD-10-CM | POA: Diagnosis not present

## 2022-10-24 ENCOUNTER — Encounter (HOSPITAL_COMMUNITY): Payer: Self-pay | Admitting: Internal Medicine

## 2022-10-24 ENCOUNTER — Other Ambulatory Visit (HOSPITAL_COMMUNITY): Payer: Self-pay

## 2022-10-24 ENCOUNTER — Ambulatory Visit (HOSPITAL_COMMUNITY): Payer: Self-pay | Admitting: Pharmacist

## 2022-10-24 ENCOUNTER — Ambulatory Visit (HOSPITAL_COMMUNITY)
Admit: 2022-10-24 | Discharge: 2022-10-24 | Disposition: A | Discharge status: Home / Self Care | Payer: Medicare Other | Source: Ambulatory Visit | Attending: Internal Medicine | Admitting: Internal Medicine

## 2022-10-24 VITALS — BP 106/0 | HR 70 | Wt 166.2 lb

## 2022-10-24 DIAGNOSIS — D8685 Sarcoid myocarditis: Secondary | ICD-10-CM | POA: Diagnosis not present

## 2022-10-24 DIAGNOSIS — Z4801 Encounter for change or removal of surgical wound dressing: Secondary | ICD-10-CM | POA: Diagnosis not present

## 2022-10-24 DIAGNOSIS — R7881 Bacteremia: Secondary | ICD-10-CM | POA: Diagnosis not present

## 2022-10-24 DIAGNOSIS — I5022 Chronic systolic (congestive) heart failure: Secondary | ICD-10-CM | POA: Diagnosis not present

## 2022-10-24 DIAGNOSIS — Z95811 Presence of heart assist device: Secondary | ICD-10-CM | POA: Diagnosis not present

## 2022-10-24 DIAGNOSIS — I472 Ventricular tachycardia, unspecified: Secondary | ICD-10-CM

## 2022-10-24 DIAGNOSIS — B952 Enterococcus as the cause of diseases classified elsewhere: Secondary | ICD-10-CM | POA: Diagnosis not present

## 2022-10-24 DIAGNOSIS — Z7901 Long term (current) use of anticoagulants: Secondary | ICD-10-CM | POA: Diagnosis not present

## 2022-10-24 DIAGNOSIS — I2510 Atherosclerotic heart disease of native coronary artery without angina pectoris: Secondary | ICD-10-CM

## 2022-10-24 LAB — CBC
HCT: 37.3 % — ABNORMAL LOW (ref 39.0–52.0)
Hemoglobin: 10.8 g/dL — ABNORMAL LOW (ref 13.0–17.0)
MCH: 26 pg (ref 26.0–34.0)
MCHC: 29 g/dL — ABNORMAL LOW (ref 30.0–36.0)
MCV: 89.9 fL (ref 80.0–100.0)
Platelets: 220 10*3/uL (ref 150–400)
RBC: 4.15 MIL/uL — ABNORMAL LOW (ref 4.22–5.81)
RDW: 17.7 % — ABNORMAL HIGH (ref 11.5–15.5)
WBC: 6.2 10*3/uL (ref 4.0–10.5)
nRBC: 0 % (ref 0.0–0.2)

## 2022-10-24 LAB — BASIC METABOLIC PANEL
Anion gap: 13 (ref 5–15)
BUN: 9 mg/dL (ref 6–20)
CO2: 24 mmol/L (ref 22–32)
Calcium: 9 mg/dL (ref 8.9–10.3)
Chloride: 102 mmol/L (ref 98–111)
Creatinine, Ser: 0.67 mg/dL (ref 0.61–1.24)
GFR, Estimated: >60 mL/min (ref >60)
Glucose, Bld: 65 mg/dL — ABNORMAL LOW (ref 70–99)
Potassium: 3.6 mmol/L (ref 3.5–5.1)
Sodium: 139 mmol/L (ref 135–145)

## 2022-10-24 LAB — PROTIME-INR
INR: 1.5 — ABNORMAL HIGH (ref 0.8–1.2)
Prothrombin Time: 18 s — ABNORMAL HIGH (ref 11.4–15.2)

## 2022-10-24 LAB — LACTATE DEHYDROGENASE: LDH: 243 U/L — ABNORMAL HIGH (ref 98–192)

## 2022-10-24 LAB — DIGOXIN LEVEL: Digoxin Level: 0.6 ng/mL — ABNORMAL LOW (ref 0.8–2.0)

## 2022-10-24 NOTE — Progress Notes (Signed)
LVAD INR 

## 2022-10-24 NOTE — Patient Instructions (Signed)
No medication changes today Coumadin dosing per Leotis Shames [facility] Follow up in 2 weeks Switch to tegaderm dressing NO skin prep; Change Tuesdays and Fridays

## 2022-10-24 NOTE — Progress Notes (Addendum)
Pt presents for hospital follow up in VAD Clinic today with caregiver [place]. Reports no problems with VAD equipment or concerns with drive line.   Pt ambulated into clinic independently without walker. States they have been adjusting well at home since discharge. States he is feeling stronger. He still has limitation with his left arm but continues strengthening exercises. He reports now being able to change power sources independently without adaptive grips. He states he has enjoyed daily walks outside. Pt discharged home with [facility] PT/OT/SLP/RN. Denies lightheadedness, dizziness, falls, shortness of breath, and signs of bleeding. He is taking all medication as prescribed. No medication changes today per Dr. Gala Romney. Will continue Aspirin 81mg  due to recent CVA. Methotrexate on hold till November 15th per Dr. Maren Beach.   Blood cultures from 9/6 positive for Enterococcus Faecalis. Pt due to complete his 6 weeks of IV Ampicillin and Ceftriaxone 10/19 then will be placed on chronic suppression with Amoxicillin. Pt has f/u with ID 11/6.  Vital Signs:  Doppler Pressure: 106 Automatic BP: 108/80 (90) HR:  70 NSR SPO2: 99 %   Weight: 166.2 lb w/ eqt Discharge weight: 174.6 lb   VAD Indication: Destination Therapy - sarcoid   LVAD assessment: HM III  Primary Controller: VAD Speed: 5450 rpms Flow: 4.4 Power: 4.4 w    PI: 4.5 Alarms: NO EXTERNAL POWER 10/21/22 accidental double disconnect Events:10-20 daily  Fixed speed: 5400 Low speed limit: 5100  Primary controller: back up battery due for replacement in 29 months Secondary controller: back up battery due for replacement in 29 months   I reviewed the LVAD parameters from today and compared the results to the patient's prior recorded data. LVAD interrogation was NEGATIVE for significant power changes, NEGATIVE for clinical alarms and STABLE for PI events/speed drops. No programming changes were made and pump is functioning within specified  parameters. Pt is performing daily controller and system monitor self tests along with completing weekly and monthly maintenance for LVAD equipment.   LVAD equipment check completed and is in good working order. Back-up equipment not present at today's visit.   Annual Equipment Maintenance on UBC/PM was performed on 08/29/22.  Education: Discussed with patient and caregiver the need to notify the VAD coordinator of any change in VAD numbers from baseline, any alarms (other than low voltage) and any concerns he/she may have about the device, equipment or percutaneous lead. Confirmed that both patient and caregiver have emergency contact information. Patient if aware that there is someone available 24/7 to assist with any VAD issues. He/she has been instructed to call 911 for any life-threatening emergencies first then page/call VAD coordinator for assistance with the VAD.   Patient was instructed of importance of protecting percutaneous lead from trauma. Discussed importance of not allowing lead to become bent, twisted, pulled on, torn, or damaged in any way.  He/she is aware that the anchor should always be worn to protect percutaneous lead as well as to prevent system controller from being dropped or falling. Educated patient that trauma to driveline insertion site is major cause of driveline infection and must be avoided at all times.  Patient was informed he MUST always have back-up system controller as well as extra batteries with him at all times. This is considered an absolute requirement necessary for his safety and well-being.   Exit Site Care: VAD dressing being maintained twice weekly by caregiver Roberta. Existing VAD dressing removed and site care performed using sterile technique. Drive line exit site cleaned with Chlora prep  applicators x 2, rinsed with saline, allowed to dry, and Vashe moistened 2x2 placed around driveline then covered with dry 4x4. Exit site healing and unincorporated,  the velour is fully implanted at exit site. Scant amount of serous drainage.Slight redness, no tenderness or foul odor. Mild rash noted around edge of tape. Cleansed with VASHE and large tegaderm used to secure rather than tape. Drive line anchor re-applied. Continue twice a week dressing changes on Tuesday/Friday. Roberta instructed to NOT use skin prep and use large tegaderm to secure. Box of tegaderms given to pt for home use.       Significant Events on VAD Support:  08/30/22: Acute infarct seen on the right temporal cortex and basal ganglia    Device: [employer] Therapies: ON Last check: 06/13/22   BP & Labs:  MAP 106 - Doppler is reflecting modified systolic   Hgb 10.8 - No S/S of bleeding. Specifically denies melena/BRBPR or nosebleeds.   LDH stable at 243 with established baseline of 180- 245. Denies tea-colored urine. No power elevations noted on interrogation.    Simmie Davies RN,BSN VAD Coordinator   Office: [phone] 24/7 Emergency VAD Pager: [phone]

## 2022-10-25 ENCOUNTER — Other Ambulatory Visit: Payer: Self-pay

## 2022-10-25 NOTE — Patient Outreach (Signed)
Telephone outreach to patient to obtain mRS was successfully completed. MRS= 1  Charles Holmes [facility] Care Management Assistant [phone] 

## 2022-10-26 DIAGNOSIS — I5022 Chronic systolic (congestive) heart failure: Secondary | ICD-10-CM | POA: Diagnosis not present

## 2022-10-26 DIAGNOSIS — Z7901 Long term (current) use of anticoagulants: Secondary | ICD-10-CM | POA: Diagnosis not present

## 2022-10-26 DIAGNOSIS — R7881 Bacteremia: Secondary | ICD-10-CM | POA: Diagnosis not present

## 2022-10-26 DIAGNOSIS — D8685 Sarcoid myocarditis: Secondary | ICD-10-CM | POA: Diagnosis not present

## 2022-10-26 DIAGNOSIS — B952 Enterococcus as the cause of diseases classified elsewhere: Secondary | ICD-10-CM | POA: Diagnosis not present

## 2022-10-26 DIAGNOSIS — I69359 Hemiplegia and hemiparesis following cerebral infarction affecting unspecified side: Secondary | ICD-10-CM | POA: Diagnosis not present

## 2022-10-26 DIAGNOSIS — Z95811 Presence of heart assist device: Secondary | ICD-10-CM | POA: Diagnosis not present

## 2022-10-26 DIAGNOSIS — I639 Cerebral infarction, unspecified: Secondary | ICD-10-CM | POA: Diagnosis not present

## 2022-10-26 DIAGNOSIS — T827XXA Infection and inflammatory reaction due to other cardiac and vascular devices, implants and grafts, initial encounter: Secondary | ICD-10-CM | POA: Diagnosis not present

## 2022-10-28 DIAGNOSIS — R7881 Bacteremia: Secondary | ICD-10-CM | POA: Diagnosis not present

## 2022-10-28 DIAGNOSIS — B952 Enterococcus as the cause of diseases classified elsewhere: Secondary | ICD-10-CM | POA: Diagnosis not present

## 2022-10-29 NOTE — Progress Notes (Signed)
LVAD CLINIC NOTE  PCP: [name], PA-C Cardiologist: Norman Herrlich, MD HF MD:    HPI:  [name] is a 57 y.o. 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 Underwent HM- 3 LVAD implant on 08/13 + clipping left atrial appendage    Admitted to [facility] 11/21 with CP. Found to have VT and EF 25-30% mild AI, MR and TR. Transferred to [facility]. Cath showed 70% prox LAD with no intervention.    S/P CRT-D upgrade 06/08/21.   6/24, admitted w/ acute CVA due to right M1 occlusion treated w/ mechanical thrombectomy.   Echo showed LV markedly dilated EF < 20% and small effusion. RV ok. CVA felt to be cardioembolic.   Admitted in 7/24 for low-output HF. Angiography showed mild nonobstructive CAD. RHC hemodynamics c/w severe NICM w/ severely elevated filling pressures and low output state c/w cardiogenic shock. Repeat PFTs w/ severe obstructive defect (FEV1 1.04L, FEV1/FVC 48%) +response to bronchodilator.   Underwent LVAD implant on 08/13 + clipping left atrial appendage d/t severe thickening and invagination of mitral valve annulus impeding flows. Apical core sent to pathology - no mention of sarcoid.   Post VAD implant c/b left-sided hemiplegia. CT head 8/14 with acute R MCA infarct. Taken to IR for thrombectomy. F/u head CT with small hemorrhagic conversion. Had some residual deficits w/ mild left sided weakness but otherwise recovered. Course further c/b enterococcus faecalis bacteremia w/ 2/2 + cultures on 9/6. ID consulted. Recommended ampicillin and ceftriaxone. PICC removed. Repeat BC X 2 09/08 NGTD. Will need 6 wks total therapy. EOD 11/04/22.    Transferred to CIR.   Here for post-hospital f/u with [name]. Doing very well. Walking independently without walker. Able to do ADLs. Continues to get stronger and takes daily walks. Denies orthopnea or PND. No fevers, chills or problems with driveline. No bleeding, melena or neuro  symptoms. No VAD alarms. Taking all meds as prescribed. Remains on IV ampicillin    VAD Indication: Destination Therapy - sarcoid   LVAD assessment: HM III   Primary Controller: VAD Speed: 5450 rpms Flow: 4.4 Power: 4.4 w    PI: 4.5 Alarms: NO EXTERNAL POWER 10/21/22 accidental double disconnect Events:10-20 daily  Fixed speed: 5400 Low speed limit: 5100   Primary controller: back up battery due for replacement in 29 months Secondary controller: back up battery due for replacement in 29 months   I reviewed the LVAD parameters from today and compared the results to the patient's prior recorded data. LVAD interrogation was NEGATIVE for significant power changes, NEGATIVE for clinical alarms and STABLE for PI events/speed drops. No programming changes were made and pump is functioning within specified parameters. Pt is performing daily controller and system monitor self tests along with completing weekly and monthly maintenance for LVAD equipment.   LVAD equipment check completed and is in good working order. Back-up equipment not present at today's visit.   Annual Equipment Maintenance on UBC/PM was performed on 08/29/22.   Past Medical History:  Diagnosis Date   CAD (coronary artery disease)    CHF (congestive heart failure) (HCC)    COPD (chronic obstructive pulmonary disease) (HCC)    GERD (gastroesophageal reflux disease)    Hyperlipidemia    Hypertension    LVAD (left ventricular assist device) present (HCC)    Stroke (HCC)    Systolic heart failure (HCC) 2021   LVEF 18%, RVEF 38% on cardiac MRI 12/19/2019. possible cardiac sarcoidosis.  Wide-complex tachycardia 2021   wears LifeVest    Current Outpatient Medications  Medication Sig Dispense Refill   acetaminophen (TYLENOL) 325 MG tablet Take 1-2 tablets (325-650 mg total) by mouth every 4 (four) hours as needed for mild pain.     albuterol (PROVENTIL) (2.5 MG/3ML) 0.083% nebulizer solution Take 3 mLs (2.5 mg total)  by nebulization every 4 (four) hours as needed for wheezing or shortness of breath. 75 mL 12   ampicillin IVPB Inject 12 g into the vein daily. As a continuous infusion Indication:  Enterocccus bacteremia in the setting of an LVAD First Dose: No Last Day of Therapy:  11/04/2022 Labs - Once weekly:  CBC/D and BMP, Labs - Once weekly: ESR and CRP Method of administration: Ambulatory Pump (Continuous Infusion) Method of administration may be changed at the discretion of home infusion pharmacist based upon assessment of the patient and/or caregiver's ability to self-administer the medication ordered. 32 Units 0   aspirin EC 81 MG tablet Take 1 tablet (81 mg total) by mouth daily. Swallow whole. 120 tablet 0   atorvastatin (LIPITOR) 80 MG tablet Take 1 tablet (80 mg total) by mouth daily. 30 tablet 0   digoxin (LANOXIN) 0.125 MG tablet Take 1 tablet (0.125 mg total) by mouth daily. 30 tablet 0   dorzolamide-timolol (COSOPT) 22.3-6.8 MG/ML ophthalmic solution Place 1 drop into both eyes in the morning and at bedtime.     fluticasone-salmeterol (ADVAIR HFA) 230-21 MCG/ACT inhaler Inhale 2 puffs into the lungs 2 (two) times daily. 1 each 12   latanoprost (XALATAN) 0.005 % ophthalmic solution Place 1 drop into both eyes at bedtime.     mexiletine (MEXITIL) 250 MG capsule Take 1 capsule (250 mg total) by mouth 2 (two) times daily. 60 capsule 0   Multiple Vitamin (MULTIVITAMIN WITH MINERALS) TABS tablet Take 1 tablet by mouth daily.     pantoprazole (PROTONIX) 40 MG tablet Take 1 tablet (40 mg total) by mouth daily before breakfast. 30 tablet 0   sertraline (ZOLOFT) 50 MG tablet Take 1 tablet (50 mg total) by mouth daily. 30 tablet 0   sildenafil (REVATIO) 20 MG tablet Take 1 tablet (20 mg total) by mouth 3 (three) times daily. 90 tablet 0   spironolactone (ALDACTONE) 25 MG tablet Take 1 tablet (25 mg total) by mouth daily. 30 tablet 0   warfarin (COUMADIN) 1 MG tablet Take 3 tablets (3 mg total) by mouth  daily at 4 PM. 90 tablet 1   amoxicillin (AMOXIL) 500 MG tablet Take 2 tablets (1,000 mg total) by mouth 2 (two) times daily. (Patient not taking: Reported on 10/24/2022) 120 tablet 5   cefTRIAXone (ROCEPHIN) IVPB Inject 2 g into the vein every 12 (twelve) hours. Indication:  Enterococcus bacteremia in the setting of LVAD  First Dose: No Last Day of Therapy:  11/04/22 Labs - Once weekly:  CBC/D and BMP, Labs - Once weekly: ESR and CRP Method of administration: IV Push Method of administration may be changed at the discretion of home infusion pharmacist based upon assessment of the patient and/or caregiver's ability to self-administer the medication ordered. 64 Units 0   No current facility-administered medications for this encounter.    Pacerone [amiodarone] and Percocet [oxycodone-acetaminophen]    Vital Signs:  Doppler Pressure: 106 Automatic BP: 108/80 (90) HR:  70 NSR SPO2: 99 %   Weight: 166.2 lb w/ eqt Discharge weight: 174.6 lb  Vitals:   10/24/22 1228 10/24/22 1229  BP: 108/80 (!) 106/0  Pulse: 70   SpO2: 99%   Weight: 75.4 kg (166 lb 3.2 oz)     Physical Exam: General:  NAD.  HEENT: normal  Neck: supple. JVP not elevated.  Carotids 2+ bilat; no bruits. No lymphadenopathy or thryomegaly appreciated. Cor: LVAD hum.  Lungs: Mildly decreased Abdomen: obese soft, nontender, non-distended. No hepatosplenomegaly. No bruits or masses. Good bowel sounds. Driveline site clean. Anchor in place.  Extremities: no cyanosis, clubbing, rash. Warm no edema  Neuro: alert & oriented x 3. LUE weak        ASSESSMENT AND PLAN:   1.  Acute on chronic Systolic HF-->Cardiogenic Shock  - Diagnosed 11/2019. Presented with VT. LHC 70% LAD  - cMRI 12/21 concerning for sarcoid and EF 18%.  - PET 2/22 at [facility] EF 25% + active sarcoid - Echo 08/26/20 EF < 20% severely dilated LV RV mildly decreased.  - Med[employer]onic CRT-D upgrade in 06/08/21 - Echo 07/10/22: EF <20%, RV okay, mod pericardial  effusion, mod MR/TR - Nonobstructive CAD, severely elevated filling pressures and low Fick CO/CI (2.7/1.4) - 08/29/22 HM III LVAD implant + clipping LAA d/t severe thickening and invagination of mitral valve annulus impeding flows.  - Doing well NYHA II - Volume status looks good - Continue digoxin for RV support. Consider stopping next visit - Consider sildeanfil 20 tid - Continue spiro 25 daily   2. HM-3 LVAD - VAD interrogated personally. Parameters stable. - LDH 243 - DL site ok - MAPs ok - INR 1.7. Goal 2.0-3.0. ASA 81 mg daily.Discussed warfarin dosing with PharmD personally.   3.  Recent stroke - Hx CVA 06/24 -Admitted 06/24 w/ R MCA stroke. S/p TPA and mechanical clot extraction. No residual deficits. Likely cardioembolic in setting of severe LV dysfunction. - Developed left sided weakness 08/14. CTA with R MCA infarct. Taken to IR for thrombectomy - Repeat CT head with small to moderate size hemorrhagic conversion.  - Recovered well. Mild LUE weakness  4. Enterococcus faecalis bacteremia - Bcx 2/2 on 9/6 - ID consult 9/6 -> ampicillin and ceftriaxone - Echo 09/10 - no obvious vegetations - Completes 6 weeks of IV Ampicillin and Ceftriaxone 10/19 then will be placed on chronic suppression with Amoxicillin. Pt has f/u with ID 11/6.    5. Hx VT - ln setting of potential sarcoid heart disease  - Off amio due to tremor. Continue mexiletine  - now s/p ICD. - No VT on ICD   6. CAD - LHC 12/07/19 70-% LAD, no intervention - LHC 8/24 non obstructive CAD.  - Continue statin. On aspirin for VAD. - No s/s angina   7. 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 EF 25% + active sarcoid - Has completed prednisone.  - holding methotrexate w/ recent surgery and active infection, can discuss timing of restarting the medication at outpatient follow-up - apical core pathology not diagnostic of cardiac sarcoidosis.    8. 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 Pulmonary  -  PFTs with severe obstructive defect, response to bronchodilator. FEV1 1.04L, FEV1/FVC 48% - stable   10. Depression - Much improved - Continue remeron and sertraline  I spent a total of 45 minutes today: 1) reviewing the patient's medical records including previous charts, labs and recent notes from other providers; 2) examining the patient and counseling them on their medical issues/explaining the plan of care; 3) adjusting meds as needed and 4) ordering lab work or other needed tests.  Arvilla Meres, MD  9:40 PM

## 2022-10-30 ENCOUNTER — Other Ambulatory Visit (HOSPITAL_COMMUNITY): Payer: Self-pay | Admitting: Unknown Physician Specialty

## 2022-10-30 DIAGNOSIS — I2510 Atherosclerotic heart disease of native coronary artery without angina pectoris: Secondary | ICD-10-CM | POA: Diagnosis not present

## 2022-10-30 DIAGNOSIS — I480 Paroxysmal atrial fibrillation: Secondary | ICD-10-CM | POA: Diagnosis not present

## 2022-10-30 DIAGNOSIS — Z7951 Long term (current) use of inhaled steroids: Secondary | ICD-10-CM | POA: Diagnosis not present

## 2022-10-30 DIAGNOSIS — Z7901 Long term (current) use of anticoagulants: Secondary | ICD-10-CM

## 2022-10-30 DIAGNOSIS — Z95811 Presence of heart assist device: Secondary | ICD-10-CM

## 2022-10-30 DIAGNOSIS — Z452 Encounter for adjustment and management of vascular access device: Secondary | ICD-10-CM | POA: Diagnosis not present

## 2022-10-30 DIAGNOSIS — Z792 Long term (current) use of antibiotics: Secondary | ICD-10-CM | POA: Diagnosis not present

## 2022-10-30 DIAGNOSIS — D62 Acute posthemorrhagic anemia: Secondary | ICD-10-CM | POA: Diagnosis not present

## 2022-10-30 DIAGNOSIS — Z7982 Long term (current) use of aspirin: Secondary | ICD-10-CM | POA: Diagnosis not present

## 2022-10-30 DIAGNOSIS — I110 Hypertensive heart disease with heart failure: Secondary | ICD-10-CM | POA: Diagnosis not present

## 2022-10-30 DIAGNOSIS — I952 Hypotension due to drugs: Secondary | ICD-10-CM | POA: Diagnosis not present

## 2022-10-30 DIAGNOSIS — Z8673 Personal history of transient ischemic attack (TIA), and cerebral infarction without residual deficits: Secondary | ICD-10-CM | POA: Diagnosis not present

## 2022-10-30 DIAGNOSIS — E119 Type 2 diabetes mellitus without complications: Secondary | ICD-10-CM | POA: Diagnosis not present

## 2022-10-30 DIAGNOSIS — E785 Hyperlipidemia, unspecified: Secondary | ICD-10-CM | POA: Diagnosis not present

## 2022-10-30 DIAGNOSIS — I5023 Acute on chronic systolic (congestive) heart failure: Secondary | ICD-10-CM | POA: Diagnosis not present

## 2022-10-30 DIAGNOSIS — J449 Chronic obstructive pulmonary disease, unspecified: Secondary | ICD-10-CM | POA: Diagnosis not present

## 2022-10-30 DIAGNOSIS — G4700 Insomnia, unspecified: Secondary | ICD-10-CM | POA: Diagnosis not present

## 2022-10-30 DIAGNOSIS — G4733 Obstructive sleep apnea (adult) (pediatric): Secondary | ICD-10-CM | POA: Diagnosis not present

## 2022-10-30 DIAGNOSIS — D509 Iron deficiency anemia, unspecified: Secondary | ICD-10-CM | POA: Diagnosis not present

## 2022-10-30 DIAGNOSIS — I82712 Chronic embolism and thrombosis of superficial veins of left upper extremity: Secondary | ICD-10-CM | POA: Diagnosis not present

## 2022-10-30 DIAGNOSIS — B952 Enterococcus as the cause of diseases classified elsewhere: Secondary | ICD-10-CM | POA: Diagnosis not present

## 2022-10-31 ENCOUNTER — Ambulatory Visit (HOSPITAL_COMMUNITY): Payer: Self-pay | Admitting: Pharmacist

## 2022-10-31 ENCOUNTER — Other Ambulatory Visit (HOSPITAL_COMMUNITY): Payer: Self-pay

## 2022-10-31 ENCOUNTER — Ambulatory Visit (HOSPITAL_COMMUNITY)
Admission: RE | Admit: 2022-10-31 | Discharge: 2022-10-31 | Disposition: A | Discharge status: Home / Self Care | Payer: Medicare Other | Source: Ambulatory Visit | Attending: Cardiology | Admitting: Cardiology

## 2022-10-31 DIAGNOSIS — Z95811 Presence of heart assist device: Secondary | ICD-10-CM | POA: Insufficient documentation

## 2022-10-31 DIAGNOSIS — Z7901 Long term (current) use of anticoagulants: Secondary | ICD-10-CM

## 2022-10-31 DIAGNOSIS — R7881 Bacteremia: Secondary | ICD-10-CM | POA: Diagnosis not present

## 2022-10-31 DIAGNOSIS — B952 Enterococcus as the cause of diseases classified elsewhere: Secondary | ICD-10-CM | POA: Diagnosis not present

## 2022-10-31 LAB — PROTIME-INR
INR: 1.4 — ABNORMAL HIGH (ref 0.8–1.2)
Prothrombin Time: 17.3 s — ABNORMAL HIGH (ref 11.4–15.2)

## 2022-10-31 MED ORDER — WARFARIN SODIUM 3 MG PO TABS: ORAL_TABLET | ORAL | 3 refills | Status: DC

## 2022-10-31 NOTE — Progress Notes (Signed)
Pt asked to see VAD Coordinator at his lab appointment today. Pt states injection cap on PICC line came off overnight. Caregiver Roberta able to clamp PICC and placed an end cap on purple lumen. VAD Coordinator assessed PICC. PICC had good blood return and flushed easily. New injection cap placed on end of purple port and line clamped. Jeri Modena notified.  Simmie Davies RN, BSN VAD Coordinator 24/7 Pager [phone]

## 2022-10-31 NOTE — Progress Notes (Signed)
LVAD INR

## 2022-11-01 ENCOUNTER — Telehealth: Payer: Self-pay

## 2022-11-01 DIAGNOSIS — I2510 Atherosclerotic heart disease of native coronary artery without angina pectoris: Secondary | ICD-10-CM | POA: Diagnosis not present

## 2022-11-01 DIAGNOSIS — E785 Hyperlipidemia, unspecified: Secondary | ICD-10-CM | POA: Diagnosis not present

## 2022-11-01 DIAGNOSIS — B952 Enterococcus as the cause of diseases classified elsewhere: Secondary | ICD-10-CM | POA: Diagnosis not present

## 2022-11-01 DIAGNOSIS — G4733 Obstructive sleep apnea (adult) (pediatric): Secondary | ICD-10-CM | POA: Diagnosis not present

## 2022-11-01 DIAGNOSIS — I480 Paroxysmal atrial fibrillation: Secondary | ICD-10-CM | POA: Diagnosis not present

## 2022-11-01 DIAGNOSIS — I952 Hypotension due to drugs: Secondary | ICD-10-CM | POA: Diagnosis not present

## 2022-11-01 DIAGNOSIS — I5023 Acute on chronic systolic (congestive) heart failure: Secondary | ICD-10-CM | POA: Diagnosis not present

## 2022-11-01 DIAGNOSIS — Z7951 Long term (current) use of inhaled steroids: Secondary | ICD-10-CM | POA: Diagnosis not present

## 2022-11-01 DIAGNOSIS — D509 Iron deficiency anemia, unspecified: Secondary | ICD-10-CM | POA: Diagnosis not present

## 2022-11-01 DIAGNOSIS — I82712 Chronic embolism and thrombosis of superficial veins of left upper extremity: Secondary | ICD-10-CM | POA: Diagnosis not present

## 2022-11-01 DIAGNOSIS — D62 Acute posthemorrhagic anemia: Secondary | ICD-10-CM | POA: Diagnosis not present

## 2022-11-01 DIAGNOSIS — Z8673 Personal history of transient ischemic attack (TIA), and cerebral infarction without residual deficits: Secondary | ICD-10-CM | POA: Diagnosis not present

## 2022-11-01 DIAGNOSIS — Z452 Encounter for adjustment and management of vascular access device: Secondary | ICD-10-CM | POA: Diagnosis not present

## 2022-11-01 DIAGNOSIS — G4700 Insomnia, unspecified: Secondary | ICD-10-CM | POA: Diagnosis not present

## 2022-11-01 DIAGNOSIS — Z95811 Presence of heart assist device: Secondary | ICD-10-CM | POA: Diagnosis not present

## 2022-11-01 DIAGNOSIS — J449 Chronic obstructive pulmonary disease, unspecified: Secondary | ICD-10-CM | POA: Diagnosis not present

## 2022-11-01 DIAGNOSIS — E119 Type 2 diabetes mellitus without complications: Secondary | ICD-10-CM | POA: Diagnosis not present

## 2022-11-01 DIAGNOSIS — Z7901 Long term (current) use of anticoagulants: Secondary | ICD-10-CM | POA: Diagnosis not present

## 2022-11-01 DIAGNOSIS — Z792 Long term (current) use of antibiotics: Secondary | ICD-10-CM | POA: Diagnosis not present

## 2022-11-01 DIAGNOSIS — Z7982 Long term (current) use of aspirin: Secondary | ICD-10-CM | POA: Diagnosis not present

## 2022-11-01 DIAGNOSIS — I110 Hypertensive heart disease with heart failure: Secondary | ICD-10-CM | POA: Diagnosis not present

## 2022-11-01 DIAGNOSIS — R7881 Bacteremia: Secondary | ICD-10-CM | POA: Diagnosis not present

## 2022-11-01 NOTE — Telephone Encounter (Signed)
Spoke with patient and his care giver [name] regarding plan to pull picc and start oral amoxcillin.  Rx has already been called into pharmacy. Patient understands he can start oral antbx after picc is pulled.  Called [facility] and relayed orders to Liana Gerold, Pharmacist . Orders confirmed and repeated before ending call Juanita Laster, RMA

## 2022-11-01 NOTE — Telephone Encounter (Signed)
Received call from Mervin Kung, RN with [facility]. States that patient called her this yesterday morning stating Tuesday night cap from picc line came off and woke up to blood and leakage. Picc was clamped but cap was never replaced. Picc was left open for six hours. RN is concerned for increase risk for infection.  Left voicemail with patient requesting call back for additional information.  Would like to know how provider would like to proceed. Patient is supposed to end antbx on 10/19.  P: [phone] Juanita Laster, RMA

## 2022-11-01 NOTE — Telephone Encounter (Signed)
Thank you :)

## 2022-11-02 DIAGNOSIS — Z7951 Long term (current) use of inhaled steroids: Secondary | ICD-10-CM | POA: Diagnosis not present

## 2022-11-02 DIAGNOSIS — I5023 Acute on chronic systolic (congestive) heart failure: Secondary | ICD-10-CM | POA: Diagnosis not present

## 2022-11-02 DIAGNOSIS — Z8673 Personal history of transient ischemic attack (TIA), and cerebral infarction without residual deficits: Secondary | ICD-10-CM | POA: Diagnosis not present

## 2022-11-02 DIAGNOSIS — G4733 Obstructive sleep apnea (adult) (pediatric): Secondary | ICD-10-CM | POA: Diagnosis not present

## 2022-11-02 DIAGNOSIS — I480 Paroxysmal atrial fibrillation: Secondary | ICD-10-CM | POA: Diagnosis not present

## 2022-11-02 DIAGNOSIS — D62 Acute posthemorrhagic anemia: Secondary | ICD-10-CM | POA: Diagnosis not present

## 2022-11-02 DIAGNOSIS — I952 Hypotension due to drugs: Secondary | ICD-10-CM | POA: Diagnosis not present

## 2022-11-02 DIAGNOSIS — Z7982 Long term (current) use of aspirin: Secondary | ICD-10-CM | POA: Diagnosis not present

## 2022-11-02 DIAGNOSIS — I2510 Atherosclerotic heart disease of native coronary artery without angina pectoris: Secondary | ICD-10-CM | POA: Diagnosis not present

## 2022-11-02 DIAGNOSIS — I110 Hypertensive heart disease with heart failure: Secondary | ICD-10-CM | POA: Diagnosis not present

## 2022-11-02 DIAGNOSIS — E785 Hyperlipidemia, unspecified: Secondary | ICD-10-CM | POA: Diagnosis not present

## 2022-11-02 DIAGNOSIS — D509 Iron deficiency anemia, unspecified: Secondary | ICD-10-CM | POA: Diagnosis not present

## 2022-11-02 DIAGNOSIS — I82712 Chronic embolism and thrombosis of superficial veins of left upper extremity: Secondary | ICD-10-CM | POA: Diagnosis not present

## 2022-11-02 DIAGNOSIS — B952 Enterococcus as the cause of diseases classified elsewhere: Secondary | ICD-10-CM | POA: Diagnosis not present

## 2022-11-02 DIAGNOSIS — Z7901 Long term (current) use of anticoagulants: Secondary | ICD-10-CM | POA: Diagnosis not present

## 2022-11-02 DIAGNOSIS — R7881 Bacteremia: Secondary | ICD-10-CM | POA: Diagnosis not present

## 2022-11-02 DIAGNOSIS — Z95811 Presence of heart assist device: Secondary | ICD-10-CM | POA: Diagnosis not present

## 2022-11-02 DIAGNOSIS — G4700 Insomnia, unspecified: Secondary | ICD-10-CM | POA: Diagnosis not present

## 2022-11-02 DIAGNOSIS — J449 Chronic obstructive pulmonary disease, unspecified: Secondary | ICD-10-CM | POA: Diagnosis not present

## 2022-11-02 DIAGNOSIS — Z452 Encounter for adjustment and management of vascular access device: Secondary | ICD-10-CM | POA: Diagnosis not present

## 2022-11-02 DIAGNOSIS — E119 Type 2 diabetes mellitus without complications: Secondary | ICD-10-CM | POA: Diagnosis not present

## 2022-11-02 DIAGNOSIS — Z792 Long term (current) use of antibiotics: Secondary | ICD-10-CM | POA: Diagnosis not present

## 2022-11-03 DIAGNOSIS — D509 Iron deficiency anemia, unspecified: Secondary | ICD-10-CM | POA: Diagnosis not present

## 2022-11-03 DIAGNOSIS — I952 Hypotension due to drugs: Secondary | ICD-10-CM | POA: Diagnosis not present

## 2022-11-03 DIAGNOSIS — Z7901 Long term (current) use of anticoagulants: Secondary | ICD-10-CM | POA: Diagnosis not present

## 2022-11-03 DIAGNOSIS — I5023 Acute on chronic systolic (congestive) heart failure: Secondary | ICD-10-CM | POA: Diagnosis not present

## 2022-11-03 DIAGNOSIS — I110 Hypertensive heart disease with heart failure: Secondary | ICD-10-CM | POA: Diagnosis not present

## 2022-11-03 DIAGNOSIS — Z7982 Long term (current) use of aspirin: Secondary | ICD-10-CM | POA: Diagnosis not present

## 2022-11-03 DIAGNOSIS — R7881 Bacteremia: Secondary | ICD-10-CM | POA: Diagnosis not present

## 2022-11-03 DIAGNOSIS — E119 Type 2 diabetes mellitus without complications: Secondary | ICD-10-CM | POA: Diagnosis not present

## 2022-11-03 DIAGNOSIS — D62 Acute posthemorrhagic anemia: Secondary | ICD-10-CM | POA: Diagnosis not present

## 2022-11-03 DIAGNOSIS — I82712 Chronic embolism and thrombosis of superficial veins of left upper extremity: Secondary | ICD-10-CM | POA: Diagnosis not present

## 2022-11-03 DIAGNOSIS — J449 Chronic obstructive pulmonary disease, unspecified: Secondary | ICD-10-CM | POA: Diagnosis not present

## 2022-11-03 DIAGNOSIS — Z452 Encounter for adjustment and management of vascular access device: Secondary | ICD-10-CM | POA: Diagnosis not present

## 2022-11-03 DIAGNOSIS — E785 Hyperlipidemia, unspecified: Secondary | ICD-10-CM | POA: Diagnosis not present

## 2022-11-03 DIAGNOSIS — I2510 Atherosclerotic heart disease of native coronary artery without angina pectoris: Secondary | ICD-10-CM | POA: Diagnosis not present

## 2022-11-03 DIAGNOSIS — G4700 Insomnia, unspecified: Secondary | ICD-10-CM | POA: Diagnosis not present

## 2022-11-03 DIAGNOSIS — G4733 Obstructive sleep apnea (adult) (pediatric): Secondary | ICD-10-CM | POA: Diagnosis not present

## 2022-11-03 DIAGNOSIS — Z792 Long term (current) use of antibiotics: Secondary | ICD-10-CM | POA: Diagnosis not present

## 2022-11-03 DIAGNOSIS — I480 Paroxysmal atrial fibrillation: Secondary | ICD-10-CM | POA: Diagnosis not present

## 2022-11-03 DIAGNOSIS — Z7951 Long term (current) use of inhaled steroids: Secondary | ICD-10-CM | POA: Diagnosis not present

## 2022-11-03 DIAGNOSIS — B952 Enterococcus as the cause of diseases classified elsewhere: Secondary | ICD-10-CM | POA: Diagnosis not present

## 2022-11-03 DIAGNOSIS — Z95811 Presence of heart assist device: Secondary | ICD-10-CM | POA: Diagnosis not present

## 2022-11-03 DIAGNOSIS — Z8673 Personal history of transient ischemic attack (TIA), and cerebral infarction without residual deficits: Secondary | ICD-10-CM | POA: Diagnosis not present

## 2022-11-06 DIAGNOSIS — G4733 Obstructive sleep apnea (adult) (pediatric): Secondary | ICD-10-CM | POA: Diagnosis not present

## 2022-11-06 DIAGNOSIS — Z7951 Long term (current) use of inhaled steroids: Secondary | ICD-10-CM | POA: Diagnosis not present

## 2022-11-06 DIAGNOSIS — I952 Hypotension due to drugs: Secondary | ICD-10-CM | POA: Diagnosis not present

## 2022-11-06 DIAGNOSIS — Z95811 Presence of heart assist device: Secondary | ICD-10-CM | POA: Diagnosis not present

## 2022-11-06 DIAGNOSIS — E785 Hyperlipidemia, unspecified: Secondary | ICD-10-CM | POA: Diagnosis not present

## 2022-11-06 DIAGNOSIS — D62 Acute posthemorrhagic anemia: Secondary | ICD-10-CM | POA: Diagnosis not present

## 2022-11-06 DIAGNOSIS — Z7901 Long term (current) use of anticoagulants: Secondary | ICD-10-CM | POA: Diagnosis not present

## 2022-11-06 DIAGNOSIS — D509 Iron deficiency anemia, unspecified: Secondary | ICD-10-CM | POA: Diagnosis not present

## 2022-11-06 DIAGNOSIS — B952 Enterococcus as the cause of diseases classified elsewhere: Secondary | ICD-10-CM | POA: Diagnosis not present

## 2022-11-06 DIAGNOSIS — I110 Hypertensive heart disease with heart failure: Secondary | ICD-10-CM | POA: Diagnosis not present

## 2022-11-06 DIAGNOSIS — J449 Chronic obstructive pulmonary disease, unspecified: Secondary | ICD-10-CM | POA: Diagnosis not present

## 2022-11-06 DIAGNOSIS — Z792 Long term (current) use of antibiotics: Secondary | ICD-10-CM | POA: Diagnosis not present

## 2022-11-06 DIAGNOSIS — I2510 Atherosclerotic heart disease of native coronary artery without angina pectoris: Secondary | ICD-10-CM | POA: Diagnosis not present

## 2022-11-06 DIAGNOSIS — I82712 Chronic embolism and thrombosis of superficial veins of left upper extremity: Secondary | ICD-10-CM | POA: Diagnosis not present

## 2022-11-06 DIAGNOSIS — Z8673 Personal history of transient ischemic attack (TIA), and cerebral infarction without residual deficits: Secondary | ICD-10-CM | POA: Diagnosis not present

## 2022-11-06 DIAGNOSIS — E119 Type 2 diabetes mellitus without complications: Secondary | ICD-10-CM | POA: Diagnosis not present

## 2022-11-06 DIAGNOSIS — I5023 Acute on chronic systolic (congestive) heart failure: Secondary | ICD-10-CM | POA: Diagnosis not present

## 2022-11-06 DIAGNOSIS — G4700 Insomnia, unspecified: Secondary | ICD-10-CM | POA: Diagnosis not present

## 2022-11-06 DIAGNOSIS — Z452 Encounter for adjustment and management of vascular access device: Secondary | ICD-10-CM | POA: Diagnosis not present

## 2022-11-06 DIAGNOSIS — Z7982 Long term (current) use of aspirin: Secondary | ICD-10-CM | POA: Diagnosis not present

## 2022-11-06 DIAGNOSIS — I480 Paroxysmal atrial fibrillation: Secondary | ICD-10-CM | POA: Diagnosis not present

## 2022-11-08 ENCOUNTER — Other Ambulatory Visit (HOSPITAL_COMMUNITY): Payer: Self-pay | Admitting: *Deleted

## 2022-11-08 DIAGNOSIS — B952 Enterococcus as the cause of diseases classified elsewhere: Secondary | ICD-10-CM | POA: Diagnosis not present

## 2022-11-08 DIAGNOSIS — Z5181 Encounter for therapeutic drug level monitoring: Secondary | ICD-10-CM

## 2022-11-08 DIAGNOSIS — I5023 Acute on chronic systolic (congestive) heart failure: Secondary | ICD-10-CM | POA: Diagnosis not present

## 2022-11-08 DIAGNOSIS — Z8673 Personal history of transient ischemic attack (TIA), and cerebral infarction without residual deficits: Secondary | ICD-10-CM | POA: Diagnosis not present

## 2022-11-08 DIAGNOSIS — G4700 Insomnia, unspecified: Secondary | ICD-10-CM | POA: Diagnosis not present

## 2022-11-08 DIAGNOSIS — I480 Paroxysmal atrial fibrillation: Secondary | ICD-10-CM | POA: Diagnosis not present

## 2022-11-08 DIAGNOSIS — D62 Acute posthemorrhagic anemia: Secondary | ICD-10-CM | POA: Diagnosis not present

## 2022-11-08 DIAGNOSIS — Z7982 Long term (current) use of aspirin: Secondary | ICD-10-CM | POA: Diagnosis not present

## 2022-11-08 DIAGNOSIS — Z452 Encounter for adjustment and management of vascular access device: Secondary | ICD-10-CM | POA: Diagnosis not present

## 2022-11-08 DIAGNOSIS — Z7901 Long term (current) use of anticoagulants: Secondary | ICD-10-CM | POA: Diagnosis not present

## 2022-11-08 DIAGNOSIS — G4733 Obstructive sleep apnea (adult) (pediatric): Secondary | ICD-10-CM | POA: Diagnosis not present

## 2022-11-08 DIAGNOSIS — I5022 Chronic systolic (congestive) heart failure: Secondary | ICD-10-CM

## 2022-11-08 DIAGNOSIS — Z95811 Presence of heart assist device: Secondary | ICD-10-CM | POA: Diagnosis not present

## 2022-11-08 DIAGNOSIS — I952 Hypotension due to drugs: Secondary | ICD-10-CM | POA: Diagnosis not present

## 2022-11-08 DIAGNOSIS — I110 Hypertensive heart disease with heart failure: Secondary | ICD-10-CM | POA: Diagnosis not present

## 2022-11-08 DIAGNOSIS — E785 Hyperlipidemia, unspecified: Secondary | ICD-10-CM | POA: Diagnosis not present

## 2022-11-08 DIAGNOSIS — I2510 Atherosclerotic heart disease of native coronary artery without angina pectoris: Secondary | ICD-10-CM | POA: Diagnosis not present

## 2022-11-08 DIAGNOSIS — I82712 Chronic embolism and thrombosis of superficial veins of left upper extremity: Secondary | ICD-10-CM | POA: Diagnosis not present

## 2022-11-08 DIAGNOSIS — Z7951 Long term (current) use of inhaled steroids: Secondary | ICD-10-CM | POA: Diagnosis not present

## 2022-11-08 DIAGNOSIS — J449 Chronic obstructive pulmonary disease, unspecified: Secondary | ICD-10-CM | POA: Diagnosis not present

## 2022-11-08 DIAGNOSIS — D509 Iron deficiency anemia, unspecified: Secondary | ICD-10-CM | POA: Diagnosis not present

## 2022-11-08 DIAGNOSIS — E119 Type 2 diabetes mellitus without complications: Secondary | ICD-10-CM | POA: Diagnosis not present

## 2022-11-08 DIAGNOSIS — Z792 Long term (current) use of antibiotics: Secondary | ICD-10-CM | POA: Diagnosis not present

## 2022-11-09 ENCOUNTER — Ambulatory Visit (HOSPITAL_COMMUNITY): Payer: Self-pay | Admitting: Pharmacist

## 2022-11-09 ENCOUNTER — Encounter (HOSPITAL_COMMUNITY): Payer: Self-pay | Admitting: Internal Medicine

## 2022-11-09 ENCOUNTER — Ambulatory Visit (HOSPITAL_COMMUNITY)
Admission: RE | Admit: 2022-11-09 | Discharge: 2022-11-09 | Disposition: A | Discharge status: Home / Self Care | Payer: Medicare Other | Source: Ambulatory Visit | Attending: Internal Medicine | Admitting: Internal Medicine

## 2022-11-09 VITALS — BP 101/85 | HR 78 | Wt 164.0 lb

## 2022-11-09 DIAGNOSIS — I5022 Chronic systolic (congestive) heart failure: Secondary | ICD-10-CM | POA: Diagnosis not present

## 2022-11-09 DIAGNOSIS — Z7901 Long term (current) use of anticoagulants: Secondary | ICD-10-CM

## 2022-11-09 DIAGNOSIS — Z5181 Encounter for therapeutic drug level monitoring: Secondary | ICD-10-CM

## 2022-11-09 DIAGNOSIS — G4733 Obstructive sleep apnea (adult) (pediatric): Secondary | ICD-10-CM | POA: Diagnosis not present

## 2022-11-09 DIAGNOSIS — D62 Acute posthemorrhagic anemia: Secondary | ICD-10-CM | POA: Diagnosis not present

## 2022-11-09 DIAGNOSIS — Z4801 Encounter for change or removal of surgical wound dressing: Secondary | ICD-10-CM | POA: Diagnosis not present

## 2022-11-09 DIAGNOSIS — I2510 Atherosclerotic heart disease of native coronary artery without angina pectoris: Secondary | ICD-10-CM | POA: Diagnosis not present

## 2022-11-09 DIAGNOSIS — I82712 Chronic embolism and thrombosis of superficial veins of left upper extremity: Secondary | ICD-10-CM | POA: Diagnosis not present

## 2022-11-09 DIAGNOSIS — Z7982 Long term (current) use of aspirin: Secondary | ICD-10-CM | POA: Diagnosis not present

## 2022-11-09 DIAGNOSIS — Z95811 Presence of heart assist device: Secondary | ICD-10-CM

## 2022-11-09 DIAGNOSIS — Z79899 Other long term (current) drug therapy: Secondary | ICD-10-CM | POA: Diagnosis not present

## 2022-11-09 DIAGNOSIS — D509 Iron deficiency anemia, unspecified: Secondary | ICD-10-CM | POA: Diagnosis not present

## 2022-11-09 DIAGNOSIS — Z452 Encounter for adjustment and management of vascular access device: Secondary | ICD-10-CM | POA: Diagnosis not present

## 2022-11-09 DIAGNOSIS — J449 Chronic obstructive pulmonary disease, unspecified: Secondary | ICD-10-CM | POA: Diagnosis not present

## 2022-11-09 DIAGNOSIS — I952 Hypotension due to drugs: Secondary | ICD-10-CM | POA: Diagnosis not present

## 2022-11-09 DIAGNOSIS — Z792 Long term (current) use of antibiotics: Secondary | ICD-10-CM | POA: Diagnosis not present

## 2022-11-09 DIAGNOSIS — Z8673 Personal history of transient ischemic attack (TIA), and cerebral infarction without residual deficits: Secondary | ICD-10-CM | POA: Diagnosis not present

## 2022-11-09 DIAGNOSIS — E785 Hyperlipidemia, unspecified: Secondary | ICD-10-CM | POA: Diagnosis not present

## 2022-11-09 DIAGNOSIS — I480 Paroxysmal atrial fibrillation: Secondary | ICD-10-CM | POA: Diagnosis not present

## 2022-11-09 DIAGNOSIS — B372 Candidiasis of skin and nail: Secondary | ICD-10-CM | POA: Diagnosis not present

## 2022-11-09 DIAGNOSIS — G4700 Insomnia, unspecified: Secondary | ICD-10-CM | POA: Diagnosis not present

## 2022-11-09 DIAGNOSIS — Z7951 Long term (current) use of inhaled steroids: Secondary | ICD-10-CM | POA: Diagnosis not present

## 2022-11-09 DIAGNOSIS — I5023 Acute on chronic systolic (congestive) heart failure: Secondary | ICD-10-CM | POA: Diagnosis not present

## 2022-11-09 DIAGNOSIS — I110 Hypertensive heart disease with heart failure: Secondary | ICD-10-CM | POA: Diagnosis not present

## 2022-11-09 DIAGNOSIS — B952 Enterococcus as the cause of diseases classified elsewhere: Secondary | ICD-10-CM | POA: Diagnosis not present

## 2022-11-09 DIAGNOSIS — E119 Type 2 diabetes mellitus without complications: Secondary | ICD-10-CM | POA: Diagnosis not present

## 2022-11-09 LAB — DIGOXIN LEVEL: Digoxin Level: 1.3 ng/mL (ref 0.8–2.0)

## 2022-11-09 LAB — BASIC METABOLIC PANEL
Anion gap: 10 (ref 5–15)
BUN: 14 mg/dL (ref 6–20)
CO2: 26 mmol/L (ref 22–32)
Calcium: 9.7 mg/dL (ref 8.9–10.3)
Chloride: 104 mmol/L (ref 98–111)
Creatinine, Ser: 0.87 mg/dL (ref 0.61–1.24)
GFR, Estimated: >60 mL/min (ref >60)
Glucose, Bld: 109 mg/dL — ABNORMAL HIGH (ref 70–99)
Potassium: 4.3 mmol/L (ref 3.5–5.1)
Sodium: 140 mmol/L (ref 135–145)

## 2022-11-09 LAB — CBC
HCT: 43.3 % (ref 39.0–52.0)
Hemoglobin: 13.1 g/dL (ref 13.0–17.0)
MCH: 25.7 pg — ABNORMAL LOW (ref 26.0–34.0)
MCHC: 30.3 g/dL (ref 30.0–36.0)
MCV: 84.9 fL (ref 80.0–100.0)
Platelets: 303 10*3/uL (ref 150–400)
RBC: 5.1 MIL/uL (ref 4.22–5.81)
RDW: 17.1 % — ABNORMAL HIGH (ref 11.5–15.5)
WBC: 6.8 10*3/uL (ref 4.0–10.5)
nRBC: 0 % (ref 0.0–0.2)

## 2022-11-09 LAB — PROTIME-INR
INR: 1.8 — ABNORMAL HIGH (ref 0.8–1.2)
Prothrombin Time: 21 s — ABNORMAL HIGH (ref 11.4–15.2)

## 2022-11-09 LAB — LACTATE DEHYDROGENASE: LDH: 228 U/L — ABNORMAL HIGH (ref 98–192)

## 2022-11-09 MED ORDER — TRAZODONE HCL 100 MG PO TABS
100.0000 mg | ORAL_TABLET | Freq: Every day | ORAL | 3 refills | Status: DC
Start: 2022-11-09 — End: 2023-04-16

## 2022-11-09 MED ORDER — NYSTATIN 100000 UNIT/GM EX CREA
1.0000 | TOPICAL_CREAM | Freq: Two times a day (BID) | CUTANEOUS | 3 refills | Status: DC
Start: 2022-11-09 — End: 2023-04-16

## 2022-11-09 NOTE — Progress Notes (Signed)
LVAD CLINIC NOTE  PCP: [name], PA-C Cardiologist: Norman Herrlich, MD HF MD:    HPI:  Charles Holmes is a 57 y.o. 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 Underwent HM- 3 LVAD implant on 08/13 + clipping left atrial appendage    Admitted to [facility] 11/21 with CP. Found to have VT and EF 25-30% mild AI, Charles and TR. Transferred to [facility]. Cath showed 70% prox LAD with no intervention.    S/P CRT-D upgrade 06/08/21.   6/24, admitted w/ acute CVA due to right M1 occlusion treated w/ mechanical thrombectomy.   Echo showed LV markedly dilated EF < 20% and small effusion. RV ok. CVA felt to be cardioembolic.   Admitted in 7/24 for low-output HF. Angiography showed mild nonobstructive CAD. RHC hemodynamics c/w severe NICM w/ severely elevated filling pressures and low output state c/w cardiogenic shock. Repeat PFTs w/ severe obstructive defect (FEV1 1.04L, FEV1/FVC 48%) +response to bronchodilator.   Underwent LVAD implant on 08/13 + clipping left atrial appendage d/t severe thickening and invagination of mitral valve annulus impeding flows. Apical core sent to pathology - no mention of sarcoid.   Post VAD implant c/b left-sided hemiplegia. CT head 8/14 with acute R MCA infarct. Taken to IR for thrombectomy. F/u head CT with small hemorrhagic conversion. Had some residual deficits w/ mild left sided weakness but otherwise recovered. Course further c/b enterococcus faecalis bacteremia w/ 2/2 + cultures on 9/6. ID consulted. Recommended ampicillin and ceftriaxone. PICC removed. Repeat BC X 2 09/08 NGTD. Will need 6 wks total therapy. EOD 11/04/22.    Transferred to CIR.   Here for post-hospital f/u with [name]. Doing very well. Feels like he is getting stronger everyday. Denies orthopnea or PND. Completed IV abx. PICC out. On po amox for suppression. No fevers, chills or problems with driveline except for mild pruritic rash around  dressing. No bleeding, melena or neuro symptoms. No VAD alarms. Taking all meds as prescribed.    VAD Indication: Destination Therapy - sarcoid   LVAD assessment: HM III   Primary Controller: VAD Speed: 5400 rpms Flow: 4.4 Power: 4.0 w    PI: 4.3 Alarms: none Events: 10-20 daily  Fixed speed: 5400 Low speed limit: 5100   Primary controller: back up battery due for replacement in 29 months Secondary controller: back up battery due for replacement in 29 months   I reviewed the LVAD parameters from today and compared the results to the patient's prior recorded data. LVAD interrogation was NEGATIVE for significant power changes, NEGATIVE for clinical alarms and STABLE for PI events/speed drops. No programming changes were made and pump is functioning within specified parameters. Pt is performing daily controller and system monitor self tests along with completing weekly and monthly maintenance for LVAD equipment.   LVAD equipment check completed and is in good working order. Back-up equipment not present at today's visit.   Annual Equipment Maintenance on UBC/PM was performed on 08/29/22.   Past Medical History:  Diagnosis Date   CAD (coronary artery disease)    CHF (congestive heart failure) (HCC)    COPD (chronic obstructive pulmonary disease) (HCC)    GERD (gastroesophageal reflux disease)    Hyperlipidemia    Hypertension    LVAD (left ventricular assist device) present (HCC)    Stroke (HCC)    Systolic heart failure (HCC) 2021   LVEF 18%, RVEF 38% on cardiac MRI 12/19/2019. possible cardiac sarcoidosis.  Wide-complex tachycardia 2021   wears LifeVest    Current Outpatient Medications  Medication Sig Dispense Refill   amoxicillin (AMOXIL) 500 MG tablet Take 2 tablets (1,000 mg total) by mouth 2 (two) times daily. 120 tablet 5   aspirin EC 81 MG tablet Take 1 tablet (81 mg total) by mouth daily. Swallow whole. 120 tablet 0   atorvastatin (LIPITOR) 80 MG tablet Take 1  tablet (80 mg total) by mouth daily. 30 tablet 0   digoxin (LANOXIN) 0.125 MG tablet Take 1 tablet (0.125 mg total) by mouth daily. 30 tablet 0   dorzolamide-timolol (COSOPT) 22.3-6.8 MG/ML ophthalmic solution Place 1 drop into both eyes in the morning and at bedtime.     fluticasone-salmeterol (ADVAIR HFA) 230-21 MCG/ACT inhaler Inhale 2 puffs into the lungs 2 (two) times daily. 1 each 12   latanoprost (XALATAN) 0.005 % ophthalmic solution Place 1 drop into both eyes at bedtime.     mexiletine (MEXITIL) 250 MG capsule Take 1 capsule (250 mg total) by mouth 2 (two) times daily. 60 capsule 0   Multiple Vitamin (MULTIVITAMIN WITH MINERALS) TABS tablet Take 1 tablet by mouth daily.     pantoprazole (PROTONIX) 40 MG tablet Take 1 tablet (40 mg total) by mouth daily before breakfast. 30 tablet 0   sertraline (ZOLOFT) 50 MG tablet Take 1 tablet (50 mg total) by mouth daily. 30 tablet 0   sildenafil (REVATIO) 20 MG tablet Take 1 tablet (20 mg total) by mouth 3 (three) times daily. 90 tablet 0   spironolactone (ALDACTONE) 25 MG tablet Take 1 tablet (25 mg total) by mouth daily. 30 tablet 0   warfarin (COUMADIN) 3 MG tablet Take 6 mg (2 tabs) every Monday/Friday and 3 mg (1 tab) all other days or as directed by the advanced heart failure clinic 60 tablet 3   acetaminophen (TYLENOL) 325 MG tablet Take 1-2 tablets (325-650 mg total) by mouth every 4 (four) hours as needed for mild pain. (Patient not taking: Reported on 11/09/2022)     albuterol (PROVENTIL) (2.5 MG/3ML) 0.083% nebulizer solution Take 3 mLs (2.5 mg total) by nebulization every 4 (four) hours as needed for wheezing or shortness of breath. (Patient not taking: Reported on 11/09/2022) 75 mL 12   cefTRIAXone (ROCEPHIN) IVPB Inject 2 g into the vein every 12 (twelve) hours. Indication:  Enterococcus bacteremia in the setting of LVAD  First Dose: No Last Day of Therapy:  11/04/22 Labs - Once weekly:  CBC/D and BMP, Labs - Once weekly: ESR and  CRP Method of administration: IV Push Method of administration may be changed at the discretion of home infusion pharmacist based upon assessment of the patient and/or caregiver's ability to self-administer the medication ordered. (Patient not taking: Reported on 11/09/2022) 64 Units 0   No current facility-administered medications for this encounter.    Pacerone [amiodarone] and Percocet [oxycodone-acetaminophen]   Vital Signs:  Doppler Pressure: 92 Automatic BP: 101/85 (92) HR: 70 NSR SPO2: UTO%   Weight: 164 lb w/ eqt Discharge weight: 166.2 lb  Vitals:   11/09/22 1044  BP: (!) 92/0    Physical Exam: General:  NAD.  HEENT: normal  Neck: supple. JVP not elevated.  Carotids 2+ bilat; no bruits. No lymphadenopathy or thryomegaly appreciated. Cor: LVAD hum.  Lungs: Clear. Abdomen: obese soft, nontender, non-distended. No hepatosplenomegaly. No bruits or masses. Good bowel sounds. Driveline site clean. Mild fungal rash around dressing Anchor in place.  Extremities: no cyanosis, clubbing, rash. Warm no edema  Neuro: alert & oriented x 3. No focal deficits. LUE weak  ASSESSMENT AND PLAN:   1.  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 Presented with VT. LHC 70% LAD  - cMRI 12/21 concerning for sarcoid and EF 18%.  - PET 2/22 at [facility] EF 25% + active sarcoid - Echo 08/26/20 EF < 20% severely dilated LV RV mildly decreased.  - Medtronic CRT-D upgrade in 06/08/21 - Echo 07/10/22: EF <20%, RV okay, mod pericardial effusion, mod Charles/TR - Nonobstructive CAD, severely elevated filling pressures and low Fick CO/CI (2.7/1.4) - 08/29/22 HM III LVAD implant + clipping LAA d/t severe thickening and invagination of mitral valve annulus impeding flows.  - Doing well with VAD support. NYHA I-II - Volume status ok - Stop digoxin - Continue spiro 25 daily   2. HM-3 LVAD - VAD interrogated personally. Parameters stable. - LDH 243 - DL site ok.  Mild fungal rash around  dressing. Can use nystatin cream - MAPs ok - INR 1.8. Goal 2.0-3.0. ASA 81 mg daily (on for CVA).Discussed warfarin dosing with PharmD personally..   3.  H/o stroke - Admitted 06/24 w/ R MCA stroke. S/p TPA and mechanical clot extraction. No residual deficits. Likely cardioembolic in setting of severe LV dysfunction. - Developed left sided weakness 08/14. CTA with R MCA infarct. Taken to IR for thrombectomy - Repeat CT head with small to moderate size hemorrhagic conversion.  - Recovered well. Mild LUE weakness - Continue warfarin/ASA  4. Enterococcus faecalis bacteremia - Bcx 2/2 on 9/6 - ID consult 9/6 -> ampicillin and ceftriaxone - Echo 09/10 - no obvious vegetations - Completed 6 weeks of IV Ampicillin and Ceftriaxone. Now on chronic suppression with Amoxicillin. - Has f/u with ID 11/6.    5. Hx VT - ln setting of potential sarcoid heart disease  - Off amio due to tremor. Continue mexiletine  - now s/p ICD. - No VT on ICD today   6. CAD - LHC 12/07/19 70-% LAD, no intervention - LHC 8/24 non obstructive CAD.  - Continue statin. On aspirin for VAD. - No s/s angina   7. Possible cardiac sarcoid - PET 2/22 at [facility] EF 25% + active sarcoid - Has completed prednisone.  - holding methotrexate w/ recent surgery and active infection, can discuss timing of restarting the medication at outpatient follow-up - apical core pathology not diagnostic of cardiac sarcoidosis.    8. 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 Pulmonary  - PFTs with severe obstructive defect, response to bronchodilator. FEV1 1.04L, FEV1/FVC 48% - stable   10. Depression - Mood much improved - Continue remeron and sertraline  11. [place] - refill trazodone  I spent a total of 45 minutes today: 1) reviewing the patient's medical records including previous charts, labs and recent notes from other providers; 2) examining the patient and counseling them on their medical issues/explaining the plan of care;  3) adjusting meds as needed and 4) ordering lab work or other needed tests.     Arvilla Meres, MD  10:56 AM

## 2022-11-09 NOTE — Patient Instructions (Signed)
May take Trazodone 100 mg at bedtime.  Use Nystatin cream twice daily on yeast under previous drive line site Coumadin dosing per Leotis Shames PharmD Continue twice weekly drive line dressing changes with VASHE Return to VAD clinic in 1 month for follow up with Dr Gala Romney

## 2022-11-09 NOTE — Progress Notes (Signed)
Pt presents for 2 week follow up in [facility] Clinic today with caregiver [place]. Reports no problems with VAD equipment or concerns with drive line.   Pt ambulated into clinic independently without walker. States he is feeling stronger. He still has limitation with his left arm but continues strengthening exercises. He reports now being able to change power sources independently without adaptive grips. He was able to button up his flannel shirt this morning independently. He states he has enjoyed daily walks outside. Has HH PT/OT/SLP/RN.    Denies lightheadedness, dizziness, falls, shortness of breath, and signs of bleeding. He is taking all medication as prescribed. Will continue Aspirin 81mg  due to recent CVA. Methotrexate on hold till November 15th per Dr. Donata Clay.   Blood cultures from 9/6 positive for Enterococcus Faecalis. Completed 6 weeks of IV Ampicillin and Ceftriaxone 10/19 then started chronic suppressive Amoxicillin 1000 mg BID. Pt has f/u with ID 11/6.  Reports he is unable to sleep at night due to hearing his pump. Per Dr Gala Romney will start Trazodone 100 mg q HS. Prescription sent to pt's pharmacy. Updated medication list provided to [place] at clinic visit today.   Pt inquiring about driving. May not drive until cleared by neurologist per Dr Gala Romney.   Drive line dressing changed today in clinic. See documentation below. To continue twice weekly dressing changes with VASHE moistened 2 x 2 at exit site. Yeasty rash under previous anchor site. Nystatin cream BID to site ordered per Dr Gala Romney. Prescription sent to pt's pharmacy. Advised to not get cream near exit site. They both verbalized understanding.   Vital Signs:  Doppler Pressure: 92 Automatic BP: 101/85 (92) HR: 70 NSR SPO2: UTO%   Weight: 164 lb w/ eqt Discharge weight: 166.2 lb   VAD Indication: Destination Therapy - sarcoid   LVAD assessment: HM III  Primary Controller: VAD Speed: 5400 rpms Flow: 4.4 Power:  4.0 w    PI: 4.3 Alarms: none Events: 10-20 daily  Fixed speed: 5400 Low speed limit: 5100  Primary controller: back up battery due for replacement in 29 months Secondary controller: back up battery due for replacement in 29 months   I reviewed the LVAD parameters from today and compared the results to the patient's prior recorded data. LVAD interrogation was NEGATIVE for significant power changes, NEGATIVE for clinical alarms and STABLE for PI events/speed drops. No programming changes were made and pump is functioning within specified parameters. Pt is performing daily controller and system monitor self tests along with completing weekly and monthly maintenance for LVAD equipment.   LVAD equipment check completed and is in good working order. Back-up equipment not present at today's visit.   Annual Equipment Maintenance on UBC/PM was performed on 08/29/22.  Exit Site Care: VAD dressing being maintained twice weekly by caregiver Roberta. Existing VAD dressing removed and site care performed using sterile technique. Drive line exit site cleaned with Chlora prep applicators x 2, rinsed with saline, allowed to dry, and Vashe moistened 2x2 placed around driveline then covered with dry 4x4. Exit site healing and unincorporated, the velour is fully implanted at exit site. Scant amount of serous drainage. Slight redness, no tenderness or foul odor noted. Yeasty rash noted under previous anchor site. Left area open to air. Covered gauze dressing with large tegaderm. Repositioned anchor over large tegaderm, leaving rash area open to air. To start Nystatin cream as prescribed by Dr Gala Romney. Continue twice a week dressing changes on Tuesday/Friday. Provided with 7 daily kits, 8 anchors,  and 4 boxes of large tegaderm for home use. States they have plenty of VASHE at home.          Significant Events on VAD Support:  08/30/22: Acute infarct seen on the right temporal cortex and basal ganglia     Device: [employer] Therapies: ON Last check: 06/13/22   BP & Labs:  MAP 92 - Doppler is reflecting MAP   Hgb 13.1 - No S/S of bleeding. Specifically denies melena/BRBPR or nosebleeds.   LDH stable at 228 with established baseline of 180- 245. Denies tea-colored urine. No power elevations noted on interrogation.    Plan:  May take Trazodone 100 mg at bedtime.  Use Nystatin cream twice daily on yeast under previous drive line site Coumadin dosing per Lauren PharmD Continue twice weekly drive line dressing changes with VASHE Return to VAD clinic in 1 month for follow up with Dr Gala Romney  [name] RN VAD Coordinator  Office: [phone]  24/7 Pager: [phone]

## 2022-11-10 ENCOUNTER — Telehealth (HOSPITAL_COMMUNITY): Payer: Self-pay | Admitting: *Deleted

## 2022-11-10 NOTE — Telephone Encounter (Signed)
Per Dr Gala Romney will stop pt's Digoxin. Medication removed from med list.  Attempted to call pt's [name] as she fills pt's pillbox, but was unable to leave a message. Spoke with [name] regarding need to stop Digoxin. He verbalized understanding, stating he will let [name] know.   [name] RN VAD Coordinator  Office: [phone]  24/7 Pager: [phone]

## 2022-11-11 DIAGNOSIS — I110 Hypertensive heart disease with heart failure: Secondary | ICD-10-CM | POA: Diagnosis not present

## 2022-11-11 DIAGNOSIS — Z95811 Presence of heart assist device: Secondary | ICD-10-CM | POA: Diagnosis not present

## 2022-11-11 DIAGNOSIS — E119 Type 2 diabetes mellitus without complications: Secondary | ICD-10-CM | POA: Diagnosis not present

## 2022-11-11 DIAGNOSIS — I2510 Atherosclerotic heart disease of native coronary artery without angina pectoris: Secondary | ICD-10-CM | POA: Diagnosis not present

## 2022-11-11 DIAGNOSIS — D62 Acute posthemorrhagic anemia: Secondary | ICD-10-CM | POA: Diagnosis not present

## 2022-11-11 DIAGNOSIS — Z792 Long term (current) use of antibiotics: Secondary | ICD-10-CM | POA: Diagnosis not present

## 2022-11-11 DIAGNOSIS — G4733 Obstructive sleep apnea (adult) (pediatric): Secondary | ICD-10-CM | POA: Diagnosis not present

## 2022-11-11 DIAGNOSIS — Z7901 Long term (current) use of anticoagulants: Secondary | ICD-10-CM | POA: Diagnosis not present

## 2022-11-11 DIAGNOSIS — J449 Chronic obstructive pulmonary disease, unspecified: Secondary | ICD-10-CM | POA: Diagnosis not present

## 2022-11-11 DIAGNOSIS — E785 Hyperlipidemia, unspecified: Secondary | ICD-10-CM | POA: Diagnosis not present

## 2022-11-11 DIAGNOSIS — I952 Hypotension due to drugs: Secondary | ICD-10-CM | POA: Diagnosis not present

## 2022-11-11 DIAGNOSIS — I82712 Chronic embolism and thrombosis of superficial veins of left upper extremity: Secondary | ICD-10-CM | POA: Diagnosis not present

## 2022-11-11 DIAGNOSIS — D509 Iron deficiency anemia, unspecified: Secondary | ICD-10-CM | POA: Diagnosis not present

## 2022-11-11 DIAGNOSIS — Z8673 Personal history of transient ischemic attack (TIA), and cerebral infarction without residual deficits: Secondary | ICD-10-CM | POA: Diagnosis not present

## 2022-11-11 DIAGNOSIS — I5023 Acute on chronic systolic (congestive) heart failure: Secondary | ICD-10-CM | POA: Diagnosis not present

## 2022-11-11 DIAGNOSIS — Z7951 Long term (current) use of inhaled steroids: Secondary | ICD-10-CM | POA: Diagnosis not present

## 2022-11-11 DIAGNOSIS — G4700 Insomnia, unspecified: Secondary | ICD-10-CM | POA: Diagnosis not present

## 2022-11-11 DIAGNOSIS — I480 Paroxysmal atrial fibrillation: Secondary | ICD-10-CM | POA: Diagnosis not present

## 2022-11-11 DIAGNOSIS — Z7982 Long term (current) use of aspirin: Secondary | ICD-10-CM | POA: Diagnosis not present

## 2022-11-11 DIAGNOSIS — B952 Enterococcus as the cause of diseases classified elsewhere: Secondary | ICD-10-CM | POA: Diagnosis not present

## 2022-11-11 DIAGNOSIS — Z452 Encounter for adjustment and management of vascular access device: Secondary | ICD-10-CM | POA: Diagnosis not present

## 2022-11-13 ENCOUNTER — Other Ambulatory Visit (HOSPITAL_COMMUNITY): Payer: Self-pay

## 2022-11-13 ENCOUNTER — Other Ambulatory Visit: Payer: Self-pay

## 2022-11-13 ENCOUNTER — Other Ambulatory Visit (HOSPITAL_COMMUNITY): Payer: Self-pay | Admitting: *Deleted

## 2022-11-13 DIAGNOSIS — I50810 Right heart failure, unspecified: Secondary | ICD-10-CM

## 2022-11-13 DIAGNOSIS — D509 Iron deficiency anemia, unspecified: Secondary | ICD-10-CM | POA: Diagnosis not present

## 2022-11-13 DIAGNOSIS — Z7951 Long term (current) use of inhaled steroids: Secondary | ICD-10-CM | POA: Diagnosis not present

## 2022-11-13 DIAGNOSIS — Z452 Encounter for adjustment and management of vascular access device: Secondary | ICD-10-CM | POA: Diagnosis not present

## 2022-11-13 DIAGNOSIS — Z7901 Long term (current) use of anticoagulants: Secondary | ICD-10-CM | POA: Diagnosis not present

## 2022-11-13 DIAGNOSIS — I5022 Chronic systolic (congestive) heart failure: Secondary | ICD-10-CM

## 2022-11-13 DIAGNOSIS — Z792 Long term (current) use of antibiotics: Secondary | ICD-10-CM | POA: Diagnosis not present

## 2022-11-13 DIAGNOSIS — I110 Hypertensive heart disease with heart failure: Secondary | ICD-10-CM | POA: Diagnosis not present

## 2022-11-13 DIAGNOSIS — G4700 Insomnia, unspecified: Secondary | ICD-10-CM | POA: Diagnosis not present

## 2022-11-13 DIAGNOSIS — Z95811 Presence of heart assist device: Secondary | ICD-10-CM | POA: Diagnosis not present

## 2022-11-13 DIAGNOSIS — I82712 Chronic embolism and thrombosis of superficial veins of left upper extremity: Secondary | ICD-10-CM | POA: Diagnosis not present

## 2022-11-13 DIAGNOSIS — B952 Enterococcus as the cause of diseases classified elsewhere: Secondary | ICD-10-CM | POA: Diagnosis not present

## 2022-11-13 DIAGNOSIS — I952 Hypotension due to drugs: Secondary | ICD-10-CM | POA: Diagnosis not present

## 2022-11-13 DIAGNOSIS — Z8673 Personal history of transient ischemic attack (TIA), and cerebral infarction without residual deficits: Secondary | ICD-10-CM | POA: Diagnosis not present

## 2022-11-13 DIAGNOSIS — I5023 Acute on chronic systolic (congestive) heart failure: Secondary | ICD-10-CM | POA: Diagnosis not present

## 2022-11-13 DIAGNOSIS — E119 Type 2 diabetes mellitus without complications: Secondary | ICD-10-CM | POA: Diagnosis not present

## 2022-11-13 DIAGNOSIS — D62 Acute posthemorrhagic anemia: Secondary | ICD-10-CM | POA: Diagnosis not present

## 2022-11-13 DIAGNOSIS — J449 Chronic obstructive pulmonary disease, unspecified: Secondary | ICD-10-CM | POA: Diagnosis not present

## 2022-11-13 DIAGNOSIS — I480 Paroxysmal atrial fibrillation: Secondary | ICD-10-CM | POA: Diagnosis not present

## 2022-11-13 DIAGNOSIS — I2510 Atherosclerotic heart disease of native coronary artery without angina pectoris: Secondary | ICD-10-CM | POA: Diagnosis not present

## 2022-11-13 DIAGNOSIS — E785 Hyperlipidemia, unspecified: Secondary | ICD-10-CM | POA: Diagnosis not present

## 2022-11-13 DIAGNOSIS — G4733 Obstructive sleep apnea (adult) (pediatric): Secondary | ICD-10-CM | POA: Diagnosis not present

## 2022-11-13 DIAGNOSIS — Z7982 Long term (current) use of aspirin: Secondary | ICD-10-CM | POA: Diagnosis not present

## 2022-11-13 MED ORDER — MEXILETINE HCL 250 MG PO CAPS: 250.0000 mg | ORAL_CAPSULE | Freq: Two times a day (BID) | ORAL | 0 refills | Status: DC

## 2022-11-13 MED ORDER — SILDENAFIL CITRATE 20 MG PO TABS: 20.0000 mg | ORAL_TABLET | Freq: Three times a day (TID) | ORAL | 11 refills | Status: DC

## 2022-11-13 MED ORDER — SPIRONOLACTONE 25 MG PO TABS: 25.0000 mg | ORAL_TABLET | Freq: Every day | ORAL | 3 refills | Status: DC

## 2022-11-13 NOTE — Telephone Encounter (Signed)
This is Dr. Haroldine Laws pt

## 2022-11-14 ENCOUNTER — Other Ambulatory Visit (HOSPITAL_COMMUNITY): Payer: Self-pay | Admitting: Unknown Physician Specialty

## 2022-11-14 ENCOUNTER — Other Ambulatory Visit: Payer: Self-pay | Admitting: Internal Medicine

## 2022-11-14 MED ORDER — SERTRALINE HCL 50 MG PO TABS: 50.0000 mg | ORAL_TABLET | Freq: Every day | ORAL | 6 refills | Status: DC

## 2022-11-15 DIAGNOSIS — Z7951 Long term (current) use of inhaled steroids: Secondary | ICD-10-CM | POA: Diagnosis not present

## 2022-11-15 DIAGNOSIS — Z7982 Long term (current) use of aspirin: Secondary | ICD-10-CM | POA: Diagnosis not present

## 2022-11-15 DIAGNOSIS — G4733 Obstructive sleep apnea (adult) (pediatric): Secondary | ICD-10-CM | POA: Diagnosis not present

## 2022-11-15 DIAGNOSIS — G4700 Insomnia, unspecified: Secondary | ICD-10-CM | POA: Diagnosis not present

## 2022-11-15 DIAGNOSIS — E119 Type 2 diabetes mellitus without complications: Secondary | ICD-10-CM | POA: Diagnosis not present

## 2022-11-15 DIAGNOSIS — D62 Acute posthemorrhagic anemia: Secondary | ICD-10-CM | POA: Diagnosis not present

## 2022-11-15 DIAGNOSIS — Z8673 Personal history of transient ischemic attack (TIA), and cerebral infarction without residual deficits: Secondary | ICD-10-CM | POA: Diagnosis not present

## 2022-11-15 DIAGNOSIS — D509 Iron deficiency anemia, unspecified: Secondary | ICD-10-CM | POA: Diagnosis not present

## 2022-11-15 DIAGNOSIS — I82712 Chronic embolism and thrombosis of superficial veins of left upper extremity: Secondary | ICD-10-CM | POA: Diagnosis not present

## 2022-11-15 DIAGNOSIS — J449 Chronic obstructive pulmonary disease, unspecified: Secondary | ICD-10-CM | POA: Diagnosis not present

## 2022-11-15 DIAGNOSIS — Z792 Long term (current) use of antibiotics: Secondary | ICD-10-CM | POA: Diagnosis not present

## 2022-11-15 DIAGNOSIS — I5023 Acute on chronic systolic (congestive) heart failure: Secondary | ICD-10-CM | POA: Diagnosis not present

## 2022-11-15 DIAGNOSIS — Z95811 Presence of heart assist device: Secondary | ICD-10-CM | POA: Diagnosis not present

## 2022-11-15 DIAGNOSIS — E785 Hyperlipidemia, unspecified: Secondary | ICD-10-CM | POA: Diagnosis not present

## 2022-11-15 DIAGNOSIS — I110 Hypertensive heart disease with heart failure: Secondary | ICD-10-CM | POA: Diagnosis not present

## 2022-11-15 DIAGNOSIS — B952 Enterococcus as the cause of diseases classified elsewhere: Secondary | ICD-10-CM | POA: Diagnosis not present

## 2022-11-15 DIAGNOSIS — Z7901 Long term (current) use of anticoagulants: Secondary | ICD-10-CM | POA: Diagnosis not present

## 2022-11-15 DIAGNOSIS — I2510 Atherosclerotic heart disease of native coronary artery without angina pectoris: Secondary | ICD-10-CM | POA: Diagnosis not present

## 2022-11-15 DIAGNOSIS — I952 Hypotension due to drugs: Secondary | ICD-10-CM | POA: Diagnosis not present

## 2022-11-15 DIAGNOSIS — I480 Paroxysmal atrial fibrillation: Secondary | ICD-10-CM | POA: Diagnosis not present

## 2022-11-15 DIAGNOSIS — Z452 Encounter for adjustment and management of vascular access device: Secondary | ICD-10-CM | POA: Diagnosis not present

## 2022-11-17 ENCOUNTER — Telehealth (HOSPITAL_COMMUNITY): Payer: Self-pay | Admitting: Unknown Physician Specialty

## 2022-11-17 ENCOUNTER — Other Ambulatory Visit (HOSPITAL_COMMUNITY): Payer: Self-pay | Admitting: *Deleted

## 2022-11-17 DIAGNOSIS — Z7901 Long term (current) use of anticoagulants: Secondary | ICD-10-CM | POA: Diagnosis not present

## 2022-11-17 DIAGNOSIS — I5023 Acute on chronic systolic (congestive) heart failure: Secondary | ICD-10-CM | POA: Diagnosis not present

## 2022-11-17 DIAGNOSIS — G4733 Obstructive sleep apnea (adult) (pediatric): Secondary | ICD-10-CM | POA: Diagnosis not present

## 2022-11-17 DIAGNOSIS — I110 Hypertensive heart disease with heart failure: Secondary | ICD-10-CM | POA: Diagnosis not present

## 2022-11-17 DIAGNOSIS — Z7951 Long term (current) use of inhaled steroids: Secondary | ICD-10-CM | POA: Diagnosis not present

## 2022-11-17 DIAGNOSIS — Z7982 Long term (current) use of aspirin: Secondary | ICD-10-CM | POA: Diagnosis not present

## 2022-11-17 DIAGNOSIS — I82712 Chronic embolism and thrombosis of superficial veins of left upper extremity: Secondary | ICD-10-CM | POA: Diagnosis not present

## 2022-11-17 DIAGNOSIS — E119 Type 2 diabetes mellitus without complications: Secondary | ICD-10-CM | POA: Diagnosis not present

## 2022-11-17 DIAGNOSIS — D62 Acute posthemorrhagic anemia: Secondary | ICD-10-CM | POA: Diagnosis not present

## 2022-11-17 DIAGNOSIS — Z8673 Personal history of transient ischemic attack (TIA), and cerebral infarction without residual deficits: Secondary | ICD-10-CM | POA: Diagnosis not present

## 2022-11-17 DIAGNOSIS — Z95811 Presence of heart assist device: Secondary | ICD-10-CM | POA: Diagnosis not present

## 2022-11-17 DIAGNOSIS — Z792 Long term (current) use of antibiotics: Secondary | ICD-10-CM | POA: Diagnosis not present

## 2022-11-17 DIAGNOSIS — E785 Hyperlipidemia, unspecified: Secondary | ICD-10-CM | POA: Diagnosis not present

## 2022-11-17 DIAGNOSIS — I2510 Atherosclerotic heart disease of native coronary artery without angina pectoris: Secondary | ICD-10-CM | POA: Diagnosis not present

## 2022-11-17 DIAGNOSIS — J449 Chronic obstructive pulmonary disease, unspecified: Secondary | ICD-10-CM | POA: Diagnosis not present

## 2022-11-17 DIAGNOSIS — G4700 Insomnia, unspecified: Secondary | ICD-10-CM | POA: Diagnosis not present

## 2022-11-17 DIAGNOSIS — I480 Paroxysmal atrial fibrillation: Secondary | ICD-10-CM | POA: Diagnosis not present

## 2022-11-17 DIAGNOSIS — I952 Hypotension due to drugs: Secondary | ICD-10-CM | POA: Diagnosis not present

## 2022-11-17 DIAGNOSIS — D509 Iron deficiency anemia, unspecified: Secondary | ICD-10-CM | POA: Diagnosis not present

## 2022-11-17 DIAGNOSIS — Z452 Encounter for adjustment and management of vascular access device: Secondary | ICD-10-CM | POA: Diagnosis not present

## 2022-11-17 DIAGNOSIS — B952 Enterococcus as the cause of diseases classified elsewhere: Secondary | ICD-10-CM | POA: Diagnosis not present

## 2022-11-17 NOTE — Telephone Encounter (Signed)
Pt called regarding sildenafil prescription. Pt was able to use a goodrx card and obtained script for $32/mth. I reached out to our patient advocate. Pt is not eligible for a PA for this medicine and goodrx card is his best option. I spoke with the pt about this and he tells me that he is financially capable of paying the $32/mth for the sildenafil.   Carlton Adam RN, BSN VAD Coordinator 24/7 Pager [phone]

## 2022-11-19 ENCOUNTER — Encounter: Payer: Self-pay | Admitting: Infectious Disease

## 2022-11-19 DIAGNOSIS — T827XXA Infection and inflammatory reaction due to other cardiac and vascular devices, implants and grafts, initial encounter: Secondary | ICD-10-CM | POA: Insufficient documentation

## 2022-11-19 DIAGNOSIS — A498 Other bacterial infections of unspecified site: Secondary | ICD-10-CM | POA: Insufficient documentation

## 2022-11-19 DIAGNOSIS — Z7185 Encounter for immunization safety counseling: Secondary | ICD-10-CM | POA: Insufficient documentation

## 2022-11-19 HISTORY — DX: Other bacterial infections of unspecified site: A49.8

## 2022-11-19 HISTORY — DX: Infection and inflammatory reaction due to other cardiac and vascular devices, implants and grafts, initial encounter: T82.7XXA

## 2022-11-20 ENCOUNTER — Ambulatory Visit: Payer: Medicare Other | Admitting: Infectious Disease

## 2022-11-20 ENCOUNTER — Encounter: Payer: Self-pay | Admitting: Infectious Disease

## 2022-11-20 ENCOUNTER — Other Ambulatory Visit: Payer: Self-pay

## 2022-11-20 VITALS — Ht 67.0 in | Wt 167.0 lb

## 2022-11-20 DIAGNOSIS — A498 Other bacterial infections of unspecified site: Secondary | ICD-10-CM | POA: Diagnosis not present

## 2022-11-20 DIAGNOSIS — T827XXA Infection and inflammatory reaction due to other cardiac and vascular devices, implants and grafts, initial encounter: Secondary | ICD-10-CM | POA: Diagnosis not present

## 2022-11-20 DIAGNOSIS — Z7185 Encounter for immunization safety counseling: Secondary | ICD-10-CM

## 2022-11-20 MED ORDER — AMOXICILLIN 500 MG PO TABS: 1000.0000 mg | ORAL_TABLET | Freq: Two times a day (BID) | ORAL | 3 refills | Status: DC

## 2022-11-20 NOTE — Progress Notes (Signed)
Subjective:  Chief complaint follow-up for Enterococcus faecalis bacteremia   Patient ID: Charles Holmes, male    DOB: July 25, 1965, 57 y.o.   MRN: [id number]  HPI  Discussed the use of AI scribe software for clinical note transcription with the patient, who gave verbal consent to proceed.  History of Present Illness   The patient, with a history of heart failure, had a Left Ventricular Assist Device (LVAD) placed in August. Unfortunately,he developed an an E faecalis infection during the hospitalization during which he had LVAD placed which entered their bloodstream via a central line The patient also had a stroke during surgery but is recovering well. They completed a course of IV antibiotics (high dose AMP and Ceftriaxone)  and are currently on Amoxicillin 1000mg  twice a day, which they will need to continue indefinitely due to the risk of recurrent infection associated with the LVAD. The patient also has a defibrillator. The patient's kidney function is good, and their white blood cell count is normal.        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 possible cardiac sarcoidosis.   Vaccine counseling 11/19/2022   Wide-complex tachycardia 2021   wears LifeVest    Past Surgical History:  Procedure Laterality Date   BIV UPGRADE N/A 06/07/2021   Procedure: BIV ICD UPGRADE;  Surgeon: Regan Lemming, MD;  Location: [facility] INVASIVE CV LAB;  Service: Cardiovascular;  Laterality: N/A;   CLIPPING  OF ATRIAL APPENDAGE Left 08/29/2022   Procedure: CLIPPING OF ATRIAL APPENDAGE;  Surgeon: Lovett Sox, MD;  Location: [facility] OR;  Service: Open Heart Surgery;  Laterality: Left;   IABP INSERTION N/A 08/25/2022   Procedure: IABP Insertion;  Surgeon: Dolores Patty, MD;  Location: [facility] INVASIVE CV LAB;  Service: Cardiovascular;  Laterality: N/A;   ICD IMPLANT N/A 02/20/2020   Procedure: ICD IMPLANT;  Surgeon: Regan Lemming, MD;  Location: [facility] INVASIVE CV LAB;  Service: Cardiovascular;  Laterality: N/A;   INSERTION OF IMPLANTABLE LEFT VENTRICULAR ASSIST DEVICE N/A 08/29/2022   Procedure: INSERTION OF IMPLANTABLE LEFT VENTRICULAR ASSIST DEVICE;  Surgeon: Lovett Sox, MD;  Location: [facility] OR;  Service: Open Heart Surgery;  Laterality: N/A;   IR CT HEAD LTD  07/10/2022   IR CT HEAD LTD  08/30/2022   IR PERCUTANEOUS ART THROMBECTOMY/INFUSION INTRACRANIAL INC DIAG ANGIO  07/10/2022   IR PERCUTANEOUS ART THROMBECTOMY/INFUSION INTRACRANIAL INC DIAG ANGIO  08/30/2022   IR US GUIDE VASC ACCESS LEFT  08/30/2022   IR US GUIDE VASC ACCESS RIGHT  07/10/2022   RADIOLOGY WITH ANESTHESIA N/A 07/10/2022   Procedure: RADIOLOGY WITH ANESTHESIA;  Surgeon: Radiologist, Medication, MD;  Location: [facility] OR;  Service: Radiology;  Laterality: N/A;   RADIOLOGY WITH ANESTHESIA N/A 08/30/2022   Procedure: IR WITH ANESTHESIA;  Surgeon: Julieanne Cotton, MD;  Location: [facility] OR;  Service: Radiology;  Laterality: N/A;   REMOVAL OF IMPELLA LEFT VENTRICULAR ASSIST DEVICE N/A 08/29/2022   Procedure: REMOVAL OF INTRA-AORTIC BALLON PUMP;  Surgeon:  Lovett Sox, MD;  Location: [facility] OR;  Service: Open Heart Surgery;  Laterality: N/A;   RIGHT HEART CATH N/A 07/14/2022   Procedure: RIGHT HEART CATH;  Surgeon: Dolores Patty, MD;  Location: [facility] INVASIVE CV LAB;  Service: Cardiovascular;  Laterality: N/A;   RIGHT HEART CATH N/A 08/25/2022   Procedure: RIGHT HEART CATH;  Surgeon: Dolores Patty, MD;  Location: [facility] INVASIVE CV LAB;  Service:  Cardiovascular;  Laterality: N/A;   RIGHT HEART CATH N/A 09/12/2022   Procedure: RIGHT HEART CATH;  Surgeon: Laurey Morale, MD;  Location[facility] INVASIVE CV LAB;  Service: Cardiovascular;  Laterality: N/A;   RIGHT/LEFT HEART CATH AND CORONARY ANGIOGRAPHY N/A 12/16/2019   Procedure: RIGHT/LEFT HEART CATH AND CORONARY ANGIOGRAPHY;  Surgeon: [place], Peter M, MD;  Location: [facility] INVASIVE CV LAB;  Service: Cardiovascular;  Laterality: N/A;   RIGHT/LEFT HEART CATH AND CORONARY ANGIOGRAPHY N/A 08/10/2022   Procedure: RIGHT/LEFT HEART CATH AND CORONARY ANGIOGRAPHY;  Surgeon: Dolores Patty, MD;  Location: [facility] INVASIVE CV LAB;  Service: Cardiovascular;  Laterality: N/A;   TEE WITHOUT CARDIOVERSION N/A 08/29/2022   Procedure: TRANSESOPHAGEAL ECHOCARDIOGRAM;  Surgeon: Lovett Sox, MD;  Location: [facility] OR;  Service: Open Heart Surgery;  Laterality: N/A;   TOOTH EXTRACTION N/A 08/22/2022   Procedure: DENTAL RESTORATION/EXTRACTIONS;  Surgeon: Ocie Doyne, DMD;  Location: [facility] OR;  Service: Oral Surgery;  Laterality: N/A;    Family History  Problem Relation Age of Onset   Hypertension Father    Heart disease Father    Heart disease Mother       Social History   Socioeconomic History   Marital status: Divorced    Spouse name: Not on file   Number of children: Not on file   Years of education: Not on file   Highest education level: Not on file  Occupational History   Not on file  Tobacco Use   Smoking status: Former    Types: Cigarettes   Smokeless tobacco: Never  Vaping Use   Vaping status: Unknown  Substance and Sexual Activity   Alcohol use: Yes    Alcohol/week: 6.0 standard drinks of alcohol    Types: 6 Cans of beer per week   Drug use: Yes    Types: Marijuana    Comment: stopped using months ago d/t how it affected breathing   Sexual activity: Yes  Other Topics Concern   Not on file  Social History Narrative   Not on file   Social Determinants of Health   Financial Resource  Strain: Low Risk  (07/12/2022)   Overall Financial Resource Strain (CARDIA)    Difficulty of Paying Living Expenses: Not hard at all  Food Insecurity: No Food Insecurity (08/13/2022)   Hunger Vital Sign    Worried About Running Out of Food in the Last Year: Never true    Ran Out of Food in the Last Year: Never true  Transportation Needs: No Transportation Needs (08/13/2022)   PRAPARE - Administrator, Civil Service (Medical): No    Lack of Transportation (Non-Medical): No  Physical Activity: Not on file  Stress: Not on file  Social Connections: Not on file    Allergies  Allergen Reactions   Pacerone [Amiodarone] Other (See Comments)    Severe tremors   Percocet [Oxycodone-Acetaminophen] Itching     Current Outpatient Medications:    acetaminophen (TYLENOL) 325 MG tablet, Take 1-2 tablets (325-650 mg total) by mouth every 4 (four) hours as needed for mild pain. (  Patient not taking: Reported on 11/09/2022), Disp: , Rfl:    albuterol (PROVENTIL) (2.5 MG/3ML) 0.083% nebulizer solution, Take 3 mLs (2.5 mg total) by nebulization every 4 (four) hours as needed for wheezing or shortness of breath. (Patient not taking: Reported on 11/09/2022), Disp: 75 mL, Rfl: 12   amoxicillin (AMOXIL) 500 MG tablet, Take 2 tablets (1,000 mg total) by mouth 2 (two) times daily., Disp: 120 tablet, Rfl: 5   aspirin EC 81 MG tablet, Take 1 tablet (81 mg total) by mouth daily. Swallow whole., Disp: 120 tablet, Rfl: 0   atorvastatin (LIPITOR) 80 MG tablet, Take 1 tablet (80 mg total) by mouth daily., Disp: 30 tablet, Rfl: 0   dorzolamide-timolol (COSOPT) 22.3-6.8 MG/ML ophthalmic solution, Place 1 drop into both eyes in the morning and at bedtime., Disp: , Rfl:    fluticasone-salmeterol (ADVAIR HFA) 230-21 MCG/ACT inhaler, Inhale 2 puffs into the lungs 2 (two) times daily., Disp: 1 each, Rfl: 12   latanoprost (XALATAN) 0.005 % ophthalmic solution, Place 1 drop into both eyes at bedtime., Disp: , Rfl:     mexiletine (MEXITIL) 250 MG capsule, TAKE 1 CAPSULE BY MOUTH 2 TIMES DAILY., Disp: 180 capsule, Rfl: 3   Multiple Vitamin (MULTIVITAMIN WITH MINERALS) TABS tablet, Take 1 tablet by mouth daily., Disp: , Rfl:    nystatin cream (MYCOSTATIN), Apply 1 Application topically 2 (two) times daily., Disp: 30 g, Rfl: 3   pantoprazole (PROTONIX) 40 MG tablet, Take 1 tablet (40 mg total) by mouth daily before breakfast., Disp: 30 tablet, Rfl: 0   sertraline (ZOLOFT) 50 MG tablet, Take 1 tablet (50 mg total) by mouth daily., Disp: 30 tablet, Rfl: 6   sildenafil (REVATIO) 20 MG tablet, Take 1 tablet (20 mg total) by mouth 3 (three) times daily., Disp: 90 tablet, Rfl: 11   spironolactone (ALDACTONE) 25 MG tablet, Take 1 tablet (25 mg total) by mouth daily., Disp: 90 tablet, Rfl: 3   traZODone (DESYREL) 100 MG tablet, Take 1 tablet (100 mg total) by mouth at bedtime., Disp: 90 tablet, Rfl: 3   warfarin (COUMADIN) 3 MG tablet, Take 6 mg (2 tabs) every Monday/Friday and 3 mg (1 tab) all other days or as directed by the advanced heart failure clinic, Disp: 60 tablet, Rfl: 3   Review of Systems  Constitutional:  Negative for activity change, appetite change, chills, diaphoresis, fatigue, fever and unexpected weight change.  HENT:  Negative for congestion, rhinorrhea, sinus pressure, sneezing, sore throat and trouble swallowing.   Eyes:  Negative for photophobia and visual disturbance.  Respiratory:  Negative for cough, chest tightness, shortness of breath, wheezing and stridor.   Cardiovascular:  Negative for chest pain, palpitations and leg swelling.  Gastrointestinal:  Negative for abdominal distention, abdominal pain, anal bleeding, blood in stool, constipation, diarrhea, nausea and vomiting.  Genitourinary:  Negative for difficulty urinating, dysuria, flank pain and hematuria.  Musculoskeletal:  Negative for arthralgias, back pain, gait problem, joint swelling and myalgias.  Skin:  Negative for color change,  pallor, rash and wound.  Neurological:  Negative for dizziness, tremors, weakness and light-headedness.  Hematological:  Negative for adenopathy. Does not bruise/bleed easily.  Psychiatric/Behavioral:  Negative for agitation, behavioral problems, confusion, decreased concentration, dysphoric mood and sleep disturbance.        Objective:   Physical Exam Constitutional:      Appearance: He is well-developed.  HENT:     Head: Normocephalic and atraumatic.  Eyes:     Conjunctiva/sclera: Conjunctivae normal.  Cardiovascular:  Rate and Rhythm: Normal rate and regular rhythm.  Pulmonary:     Effort: Pulmonary effort is normal. No respiratory distress.     Breath sounds: No wheezing.  Abdominal:     General: There is no distension.     Palpations: Abdomen is soft.  Musculoskeletal:        General: No tenderness. Normal range of motion.     Cervical back: Normal range of motion and neck supple.  Skin:    General: Skin is warm and dry.     Coloration: Skin is not pale.     Findings: No erythema or rash.  Neurological:     General: No focal deficit present.     Mental Status: He is alert and oriented to person, place, and time.  Psychiatric:        Mood and Affect: Mood normal.        Behavior: Behavior normal.        Thought Content: Thought content normal.        Judgment: Judgment normal.           Assessment & Plan:   Assessment and Plan    LVAD Infection and ICD infection History of bloodstream infection following LVAD placement,  secondary to infected central line. Completed course of IV antibiotics and currently on Amoxicillin 1000mg  twice daily. Discussed the necessity of long-term antibiotic therapy due to the risk of recurrent infection given the presence of the LVAD. -Continue Amoxicillin 1000mg  twice daily indefinitely. .  Stroke during LVAD Surgery History of stroke during LVAD placement surgery. Reports improvement in deficits. -Monitor for any new or  worsening neurological symptoms.  General Health Maintenance -Declined COVID-19 vaccination. -Continue regular follow-ups with cardiology for LVAD management. -Schedule follow-up in 3 months.     I have personally spent 40 minutes involved in face-to-face and non-face-to-face activities for this patient on the day of the visit. Professional time spent includes the following activities: Preparing to see the patient (review of tests), Obtaining and/or reviewing separately obtained history (admission/discharge record), Performing a medically appropriate examination and/or evaluation , Ordering medications/tests/procedures, referring and communicating with other health care professionals, Documenting clinical information in the EMR, Independently interpreting results (not separately reported), Communicating results to the patient/family/caregiver, Counseling and educating the patient/family/caregiver and Care coordination (not separately reported).

## 2022-11-20 NOTE — Patient Instructions (Signed)
3 follow-up with Dr. Renold Don or myself

## 2022-11-21 DIAGNOSIS — B952 Enterococcus as the cause of diseases classified elsewhere: Secondary | ICD-10-CM | POA: Diagnosis not present

## 2022-11-21 DIAGNOSIS — I5023 Acute on chronic systolic (congestive) heart failure: Secondary | ICD-10-CM | POA: Diagnosis not present

## 2022-11-21 DIAGNOSIS — J449 Chronic obstructive pulmonary disease, unspecified: Secondary | ICD-10-CM | POA: Diagnosis not present

## 2022-11-21 DIAGNOSIS — E785 Hyperlipidemia, unspecified: Secondary | ICD-10-CM | POA: Diagnosis not present

## 2022-11-21 DIAGNOSIS — D62 Acute posthemorrhagic anemia: Secondary | ICD-10-CM | POA: Diagnosis not present

## 2022-11-21 DIAGNOSIS — Z7951 Long term (current) use of inhaled steroids: Secondary | ICD-10-CM | POA: Diagnosis not present

## 2022-11-21 DIAGNOSIS — I110 Hypertensive heart disease with heart failure: Secondary | ICD-10-CM | POA: Diagnosis not present

## 2022-11-21 DIAGNOSIS — Z792 Long term (current) use of antibiotics: Secondary | ICD-10-CM | POA: Diagnosis not present

## 2022-11-21 DIAGNOSIS — G4733 Obstructive sleep apnea (adult) (pediatric): Secondary | ICD-10-CM | POA: Diagnosis not present

## 2022-11-21 DIAGNOSIS — I82712 Chronic embolism and thrombosis of superficial veins of left upper extremity: Secondary | ICD-10-CM | POA: Diagnosis not present

## 2022-11-21 DIAGNOSIS — I480 Paroxysmal atrial fibrillation: Secondary | ICD-10-CM | POA: Diagnosis not present

## 2022-11-21 DIAGNOSIS — I2510 Atherosclerotic heart disease of native coronary artery without angina pectoris: Secondary | ICD-10-CM | POA: Diagnosis not present

## 2022-11-21 DIAGNOSIS — G4700 Insomnia, unspecified: Secondary | ICD-10-CM | POA: Diagnosis not present

## 2022-11-21 DIAGNOSIS — Z8673 Personal history of transient ischemic attack (TIA), and cerebral infarction without residual deficits: Secondary | ICD-10-CM | POA: Diagnosis not present

## 2022-11-21 DIAGNOSIS — I952 Hypotension due to drugs: Secondary | ICD-10-CM | POA: Diagnosis not present

## 2022-11-21 DIAGNOSIS — Z7982 Long term (current) use of aspirin: Secondary | ICD-10-CM | POA: Diagnosis not present

## 2022-11-21 DIAGNOSIS — Z95811 Presence of heart assist device: Secondary | ICD-10-CM | POA: Diagnosis not present

## 2022-11-21 DIAGNOSIS — Z452 Encounter for adjustment and management of vascular access device: Secondary | ICD-10-CM | POA: Diagnosis not present

## 2022-11-21 DIAGNOSIS — Z7901 Long term (current) use of anticoagulants: Secondary | ICD-10-CM | POA: Diagnosis not present

## 2022-11-21 DIAGNOSIS — D509 Iron deficiency anemia, unspecified: Secondary | ICD-10-CM | POA: Diagnosis not present

## 2022-11-21 DIAGNOSIS — E119 Type 2 diabetes mellitus without complications: Secondary | ICD-10-CM | POA: Diagnosis not present

## 2022-11-22 ENCOUNTER — Ambulatory Visit: Payer: Medicare Other | Admitting: Internal Medicine

## 2022-11-23 ENCOUNTER — Ambulatory Visit (HOSPITAL_COMMUNITY)
Admission: RE | Admit: 2022-11-23 | Discharge: 2022-11-23 | Disposition: A | Discharge status: Home / Self Care | Payer: Medicare Other | Source: Ambulatory Visit | Attending: Cardiology | Admitting: Cardiology

## 2022-11-23 ENCOUNTER — Ambulatory Visit (HOSPITAL_COMMUNITY): Payer: Self-pay | Admitting: Pharmacist

## 2022-11-23 DIAGNOSIS — Z95811 Presence of heart assist device: Secondary | ICD-10-CM | POA: Diagnosis not present

## 2022-11-23 DIAGNOSIS — Z7901 Long term (current) use of anticoagulants: Secondary | ICD-10-CM | POA: Insufficient documentation

## 2022-11-23 LAB — PROTIME-INR
INR: 1.9 — ABNORMAL HIGH (ref 0.8–1.2)
Prothrombin Time: 21.7 s — ABNORMAL HIGH (ref 11.4–15.2)

## 2022-11-24 DIAGNOSIS — J449 Chronic obstructive pulmonary disease, unspecified: Secondary | ICD-10-CM | POA: Diagnosis not present

## 2022-11-24 DIAGNOSIS — Z452 Encounter for adjustment and management of vascular access device: Secondary | ICD-10-CM | POA: Diagnosis not present

## 2022-11-24 DIAGNOSIS — Z8673 Personal history of transient ischemic attack (TIA), and cerebral infarction without residual deficits: Secondary | ICD-10-CM | POA: Diagnosis not present

## 2022-11-24 DIAGNOSIS — G4700 Insomnia, unspecified: Secondary | ICD-10-CM | POA: Diagnosis not present

## 2022-11-24 DIAGNOSIS — Z7951 Long term (current) use of inhaled steroids: Secondary | ICD-10-CM | POA: Diagnosis not present

## 2022-11-24 DIAGNOSIS — D62 Acute posthemorrhagic anemia: Secondary | ICD-10-CM | POA: Diagnosis not present

## 2022-11-24 DIAGNOSIS — I952 Hypotension due to drugs: Secondary | ICD-10-CM | POA: Diagnosis not present

## 2022-11-24 DIAGNOSIS — B952 Enterococcus as the cause of diseases classified elsewhere: Secondary | ICD-10-CM | POA: Diagnosis not present

## 2022-11-24 DIAGNOSIS — I82712 Chronic embolism and thrombosis of superficial veins of left upper extremity: Secondary | ICD-10-CM | POA: Diagnosis not present

## 2022-11-24 DIAGNOSIS — I2510 Atherosclerotic heart disease of native coronary artery without angina pectoris: Secondary | ICD-10-CM | POA: Diagnosis not present

## 2022-11-24 DIAGNOSIS — I110 Hypertensive heart disease with heart failure: Secondary | ICD-10-CM | POA: Diagnosis not present

## 2022-11-24 DIAGNOSIS — I5023 Acute on chronic systolic (congestive) heart failure: Secondary | ICD-10-CM | POA: Diagnosis not present

## 2022-11-24 DIAGNOSIS — G4733 Obstructive sleep apnea (adult) (pediatric): Secondary | ICD-10-CM | POA: Diagnosis not present

## 2022-11-24 DIAGNOSIS — Z95811 Presence of heart assist device: Secondary | ICD-10-CM | POA: Diagnosis not present

## 2022-11-24 DIAGNOSIS — Z7982 Long term (current) use of aspirin: Secondary | ICD-10-CM | POA: Diagnosis not present

## 2022-11-24 DIAGNOSIS — Z792 Long term (current) use of antibiotics: Secondary | ICD-10-CM | POA: Diagnosis not present

## 2022-11-24 DIAGNOSIS — I480 Paroxysmal atrial fibrillation: Secondary | ICD-10-CM | POA: Diagnosis not present

## 2022-11-24 DIAGNOSIS — Z7901 Long term (current) use of anticoagulants: Secondary | ICD-10-CM | POA: Diagnosis not present

## 2022-11-24 DIAGNOSIS — D509 Iron deficiency anemia, unspecified: Secondary | ICD-10-CM | POA: Diagnosis not present

## 2022-11-24 DIAGNOSIS — E119 Type 2 diabetes mellitus without complications: Secondary | ICD-10-CM | POA: Diagnosis not present

## 2022-11-24 DIAGNOSIS — E785 Hyperlipidemia, unspecified: Secondary | ICD-10-CM | POA: Diagnosis not present

## 2022-11-28 DIAGNOSIS — D509 Iron deficiency anemia, unspecified: Secondary | ICD-10-CM | POA: Diagnosis not present

## 2022-11-28 DIAGNOSIS — Z792 Long term (current) use of antibiotics: Secondary | ICD-10-CM | POA: Diagnosis not present

## 2022-11-28 DIAGNOSIS — G4700 Insomnia, unspecified: Secondary | ICD-10-CM | POA: Diagnosis not present

## 2022-11-28 DIAGNOSIS — Z8673 Personal history of transient ischemic attack (TIA), and cerebral infarction without residual deficits: Secondary | ICD-10-CM | POA: Diagnosis not present

## 2022-11-28 DIAGNOSIS — E785 Hyperlipidemia, unspecified: Secondary | ICD-10-CM | POA: Diagnosis not present

## 2022-11-28 DIAGNOSIS — D62 Acute posthemorrhagic anemia: Secondary | ICD-10-CM | POA: Diagnosis not present

## 2022-11-28 DIAGNOSIS — Z452 Encounter for adjustment and management of vascular access device: Secondary | ICD-10-CM | POA: Diagnosis not present

## 2022-11-28 DIAGNOSIS — I5023 Acute on chronic systolic (congestive) heart failure: Secondary | ICD-10-CM | POA: Diagnosis not present

## 2022-11-28 DIAGNOSIS — G4733 Obstructive sleep apnea (adult) (pediatric): Secondary | ICD-10-CM | POA: Diagnosis not present

## 2022-11-28 DIAGNOSIS — I110 Hypertensive heart disease with heart failure: Secondary | ICD-10-CM | POA: Diagnosis not present

## 2022-11-28 DIAGNOSIS — B952 Enterococcus as the cause of diseases classified elsewhere: Secondary | ICD-10-CM | POA: Diagnosis not present

## 2022-11-28 DIAGNOSIS — Z7982 Long term (current) use of aspirin: Secondary | ICD-10-CM | POA: Diagnosis not present

## 2022-11-28 DIAGNOSIS — I480 Paroxysmal atrial fibrillation: Secondary | ICD-10-CM | POA: Diagnosis not present

## 2022-11-28 DIAGNOSIS — I952 Hypotension due to drugs: Secondary | ICD-10-CM | POA: Diagnosis not present

## 2022-11-28 DIAGNOSIS — I2510 Atherosclerotic heart disease of native coronary artery without angina pectoris: Secondary | ICD-10-CM | POA: Diagnosis not present

## 2022-11-28 DIAGNOSIS — E119 Type 2 diabetes mellitus without complications: Secondary | ICD-10-CM | POA: Diagnosis not present

## 2022-11-28 DIAGNOSIS — Z7901 Long term (current) use of anticoagulants: Secondary | ICD-10-CM | POA: Diagnosis not present

## 2022-11-28 DIAGNOSIS — Z7951 Long term (current) use of inhaled steroids: Secondary | ICD-10-CM | POA: Diagnosis not present

## 2022-11-28 DIAGNOSIS — I82712 Chronic embolism and thrombosis of superficial veins of left upper extremity: Secondary | ICD-10-CM | POA: Diagnosis not present

## 2022-11-28 DIAGNOSIS — Z95811 Presence of heart assist device: Secondary | ICD-10-CM | POA: Diagnosis not present

## 2022-11-28 DIAGNOSIS — J449 Chronic obstructive pulmonary disease, unspecified: Secondary | ICD-10-CM | POA: Diagnosis not present

## 2022-11-30 ENCOUNTER — Ambulatory Visit: Payer: Medicare Other | Admitting: Neurology

## 2022-11-30 ENCOUNTER — Encounter: Payer: Self-pay | Admitting: Neurology

## 2022-11-30 VITALS — BP 108/64 | HR 34 | Ht 67.0 in | Wt 163.0 lb

## 2022-11-30 DIAGNOSIS — I5022 Chronic systolic (congestive) heart failure: Secondary | ICD-10-CM | POA: Diagnosis not present

## 2022-11-30 DIAGNOSIS — Z131 Encounter for screening for diabetes mellitus: Secondary | ICD-10-CM | POA: Diagnosis not present

## 2022-11-30 DIAGNOSIS — D8685 Sarcoid myocarditis: Secondary | ICD-10-CM | POA: Diagnosis not present

## 2022-11-30 DIAGNOSIS — G4700 Insomnia, unspecified: Secondary | ICD-10-CM | POA: Diagnosis not present

## 2022-11-30 DIAGNOSIS — I63411 Cerebral infarction due to embolism of right middle cerebral artery: Secondary | ICD-10-CM | POA: Diagnosis not present

## 2022-11-30 DIAGNOSIS — Z95811 Presence of heart assist device: Secondary | ICD-10-CM

## 2022-11-30 DIAGNOSIS — I69359 Hemiplegia and hemiparesis following cerebral infarction affecting unspecified side: Secondary | ICD-10-CM | POA: Diagnosis not present

## 2022-11-30 DIAGNOSIS — Z0000 Encounter for general adult medical examination without abnormal findings: Secondary | ICD-10-CM | POA: Diagnosis not present

## 2022-11-30 DIAGNOSIS — Z79899 Other long term (current) drug therapy: Secondary | ICD-10-CM | POA: Diagnosis not present

## 2022-11-30 DIAGNOSIS — T827XXA Infection and inflammatory reaction due to other cardiac and vascular devices, implants and grafts, initial encounter: Secondary | ICD-10-CM | POA: Diagnosis not present

## 2022-11-30 DIAGNOSIS — R29898 Other symptoms and signs involving the musculoskeletal system: Secondary | ICD-10-CM

## 2022-11-30 NOTE — Patient Instructions (Signed)
I had a long d/w patient about his recent recurrent right MCA stroke and thrombectomy x 2, atrial fibrillation, risk for recurrent stroke/TIAs, personally independently reviewed imaging studies and stroke evaluation results and answered questions.Continue warfarin daily  for secondary stroke prevention with target INR between 2 and 3 due to his LVAD device and atrial fibrillation and maintain strict control of hypertension with blood pressure goal below 130/90, diabetes with hemoglobin A1c goal below 6.5% and lipids with LDL cholesterol goal below 70 mg/dL. I also advised the patient to eat a healthy diet with plenty of whole grains, cereals, fruits and vegetables, exercise regularly and maintain ideal body weight .patient is doing quite well neurologically and is cleared to drive his car.  Continue close follow-up with Dr. Gala Romney in the heart failure clinic.  Followup in the future with me in 6 months or call earlier if necessary.  Stroke Prevention Some medical conditions and behaviors can lead to a higher chance of having a stroke. You can help prevent a stroke by eating healthy, exercising, not smoking, and managing any medical conditions you have. Stroke is a leading cause of functional impairment. Primary prevention is particularly important because a majority of strokes are first-time events. Stroke changes the lives of not only those who experience a stroke but also their family and other caregivers. How can this condition affect me? A stroke is a medical emergency and should be treated right away. A stroke can lead to brain damage and can sometimes be life-threatening. If a person gets medical treatment right away, there is a better chance of surviving and recovering from a stroke. What can increase my risk? The following medical conditions may increase your risk of a stroke: Cardiovascular disease. High blood pressure (hypertension). Diabetes. High cholesterol. Sickle cell disease. Blood  clotting disorders (hypercoagulable state). Obesity. Sleep disorders (obstructive sleep apnea). Other risk factors include: Being older than age 70. Having a history of blood clots, stroke, or mini-stroke (transient ischemic attack, TIA). Genetic factors, such as race, ethnicity, or a family history of stroke. Smoking cigarettes or using other tobacco products. Taking birth control pills, especially if you also use tobacco. Heavy use of alcohol or drugs, especially cocaine and methamphetamine. Physical inactivity. What actions can I take to prevent this? Manage your health conditions High cholesterol levels. Eating a healthy diet is important for preventing high cholesterol. If cholesterol cannot be managed through diet alone, you may need to take medicines. Take any prescribed medicines to control your cholesterol as told by your health care provider. Hypertension. To reduce your risk of stroke, try to keep your blood pressure below 130/80. Eating a healthy diet and exercising regularly are important for controlling blood pressure. If these steps are not enough to manage your blood pressure, you may need to take medicines. Take any prescribed medicines to control hypertension as told by your health care provider. Ask your health care provider if you should monitor your blood pressure at home. Have your blood pressure checked every year, even if your blood pressure is normal. Blood pressure increases with age and some medical conditions. Diabetes. Eating a healthy diet and exercising regularly are important parts of managing your blood sugar (glucose). If your blood sugar cannot be managed through diet and exercise, you may need to take medicines. Take any prescribed medicines to control your diabetes as told by your health care provider. Get evaluated for obstructive sleep apnea. Talk to your health care provider about getting a sleep evaluation if you snore  a lot or have excessive  sleepiness. Make sure that any other medical conditions you have, such as atrial fibrillation or atherosclerosis, are managed. Nutrition Follow instructions from your health care provider about what to eat or drink to help manage your health condition. These instructions may include: Reducing your daily calorie intake. Limiting how much salt (sodium) you use to 1,500 milligrams (mg) each day. Using only healthy fats for cooking, such as olive oil, canola oil, or sunflower oil. Eating healthy foods. You can do this by: Choosing foods that are high in fiber, such as whole grains, and fresh fruits and vegetables. Eating at least 5 servings of fruits and vegetables a day. Try to fill one-half of your plate with fruits and vegetables at each meal. Choosing lean protein foods, such as lean cuts of meat, poultry without skin, fish, tofu, beans, and nuts. Eating low-fat dairy products. Avoiding foods that are high in sodium. This can help lower blood pressure. Avoiding foods that have saturated fat, trans fat, and cholesterol. This can help prevent high cholesterol. Avoiding processed and prepared foods. Counting your daily carbohydrate intake.  Lifestyle If you drink alcohol: Limit how much you have to: 0-1 drink a day for women who are not pregnant. 0-2 drinks a day for men. Know how much alcohol is in your drink. In the [place] one drink equals one 12 oz bottle of beer ( ), one 5 oz glass of wine ( ), or one 1 oz glass of hard liquor (44mL). Do not use any products that contain nicotine or tobacco. These products include cigarettes, chewing tobacco, and vaping devices, such as e-cigarettes. If you need help quitting, ask your health care provider. Avoid secondhand smoke. Do not use drugs. Activity  Try to stay at a healthy weight. Get at least 30 minutes of exercise on most days, such as: Fast walking. Biking. Swimming. Medicines Take over-the-counter and prescription medicines  only as told by your health care provider. Aspirin or blood thinners (antiplatelets or anticoagulants) may be recommended to reduce your risk of forming blood clots that can lead to stroke. Avoid taking birth control pills. Talk to your health care provider about the risks of taking birth control pills if: You are over 55 years old. You smoke. You get very bad headaches. You have had a blood clot. Where to find more information American Stroke Association: www.strokeassociation.org Get help right away if: You or a loved one has any symptoms of a stroke. "BE FAST" is an easy way to remember the main warning signs of a stroke: B - Balance. Signs are dizziness, sudden trouble walking, or loss of balance. E - Eyes. Signs are trouble seeing or a sudden change in vision. F - Face. Signs are sudden weakness or numbness of the face, or the face or eyelid drooping on one side. A - Arms. Signs are weakness or numbness in an arm. This happens suddenly and usually on one side of the body. S - Speech. Signs are sudden trouble speaking, slurred speech, or trouble understanding what people say. T - Time. Time to call emergency services. Write down what time symptoms started. You or a loved one has other signs of a stroke, such as: A sudden, severe headache with no known cause. Nausea or vomiting. Seizure. These symptoms may represent a serious problem that is an emergency. Do not wait to see if the symptoms will go away. Get medical help right away. Call your local emergency services (911 in the [place] Do not drive  yourself to the hospital. Summary You can help to prevent a stroke by eating healthy, exercising, not smoking, limiting alcohol intake, and managing any medical conditions you may have. Do not use any products that contain nicotine or tobacco. These include cigarettes, chewing tobacco, and vaping devices, such as e-cigarettes. If you need help quitting, ask your health care provider. Remember "BE  FAST" for warning signs of a stroke. Get help right away if you or a loved one has any of these signs. This information is not intended to replace advice given to you by your health care provider. Make sure you discuss any questions you have with your health care provider. Document Revised: 12/05/2021 Document Reviewed: 12/05/2021 Elsevier Patient Education  2024 [employer].

## 2022-11-30 NOTE — Progress Notes (Signed)
[facility] [address] [place]. [place] [address] [phone]       OFFICE FOLLOW-UP NOTE  [name] Date of Birth:  [birth year] Medical Record Number:  [id number]   HPI: Charles Holmes is a 57 year old Caucasian male seen today for office follow-up visit following hospital admission for stroke in June and August 2024.  History is obtained from the patient and review of electronic medical records.  I personally reviewed pertinent available imaging films in PACS. 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  He initially presented on 07/10/2022 to [facility] and transferred from [facility] where he presented with acute onset left-sided weakness.  CT head was negative with aspect score of 10.  Teleneurology was consulted and he was given IV tPA and transferred to [facility] where CT angiogram revealed acute right M1 occlusion.  Patient was taken for emergent mechanical thrombectomy.  NIH stroke scale was 6 [facility] but had worsened to 8 upon arrival here.  Patient was kept in the ICU and blood pressure tightly controlled.  He did well and was extubated.  MRI showed patchy right insular cortex, temporal lobe and basal ganglia infarcts.  Patient was started on Eliquis for his atrial fibrillation.  2D echo showed ejection fraction less than 20%.  ICD interrogation did not reveal any A-fib.  LDL cholesterol 59 mg percent.  Hemoglobin A1c of 5.1.  He did well and in fact was discharged with outpatient occupational therapy and no need for PT or sleep therapy.Marland Kitchen   He presented again on 08/30/2022 for cardiogenic shock and underwent LVAD placement emergently.  Immediate postprocedure he was noted to be moving all extremities well and went to bed around midnight.  As sedation was  being weaned off in anticipation of extubation it was noted that he was unable to move the left upper and lower extremity and a code stroke was activated.  On initial exam by neurohospitalist he was found to have disconjugate gaze with left to the right arm and right leg but had no movement on the left side.  Code stroke CT head showed no hemorrhage but did show developing right MCA stroke with aspect score of 7.  CT angiogram again showed right M1 occlusion CT perfusion showed a core infarct of 12 cc and penumbra of 90 cc.  Patient was again taken for emergent mechanical thrombectomy after discussion with family.  NIH stroke scale was 17.  Procedure went well and patient had excellent revascularization.  He was extubated the next day.  Echocardiogram showed ejection fraction of 15% with severe left ventricular decrease systolic function with global hypokinesis.  LDL cholesterol 51 mg percent.  Hemoglobin A1c of 5.5.  INR is 1.4 on admission.  Patient was switched to Eliquis.  He was extubated and had mild left-sided weakness.  He was seen by therapies and transferred to inpatient rehab.  Patient states he is doing well.  He is currently living with his cousin and his wife.  He is finished therapies and can walk independently without assistance.  He still has some mild weakness in the left grip and at times with left leg drags.  He plans to go home and live alone by himself tomorrow.  He is now on warfarin and he plans to get her INR machine to check it regularly at home.  Patient's  hospital course was complicated by Underwent LVAD implant on 08/13 + clipping left atrial appendage d/t severe thickening and invagination of mitral valve annulus impeding flows. Apical core sent to pathology - no mention of sarcoid.Post VAD implant c/b left-sided hemiplegia. CT head 8/14 with acute R MCA infarct. Taken to IR for thrombectomy. F/u head CT with small hemorrhagic conversion. Had some residual deficits w/ mild left sided  weakness but otherwise recovered. Course further c/b enterococcus faecalis bacteremia w/ 2/2 + cultures on 9/6. ID consulted. Recommended ampicillin and ceftriaxone. PICC removed. Repeat BC X 2 negative but he has been asked by ID to continue on amoxicillin 1000 mg twice daily indefinitely. 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 possible cardiac sarcoidosis.   Vaccine counseling 11/19/2022   Wide-complex tachycardia 2021   wears LifeVest    Social History:  Social History   Socioeconomic History   Marital status: Divorced    Spouse name: Not on file   Number of children: Not on file   Years of education: Not on file   Highest education level: Not on file  Occupational History   Not on file  Tobacco Use   Smoking status: Former    Types: Cigarettes   Smokeless tobacco: Never  Vaping Use   Vaping status: Unknown  Substance and Sexual Activity   Alcohol use: Yes    Alcohol/week: 6.0 standard drinks of alcohol    Types: 6 Cans of beer per week   Drug use: Yes    Types: Marijuana    Comment: stopped using months ago d/t how it affected breathing   Sexual activity: Yes  Other Topics Concern   Not on file  Social History Narrative   Not on file   Social Determinants of Health   Financial Resource Strain: Low Risk  (07/12/2022)    Overall Financial Resource Strain (CARDIA)    Difficulty of Paying Living Expenses: Not hard at all  Food Insecurity: No Food Insecurity (08/13/2022)   Hunger Vital Sign    Worried About Running Out of Food in the Last Year: Never true    Ran Out of Food in the Last Year: Never true  Transportation Needs: No Transportation Needs (08/13/2022)   PRAPARE - Administrator, Civil Service (Medical): No    Lack of Transportation (Non-Medical): No  Physical Activity: Not on file  Stress: Not on file  Social Connections: Not on file  Intimate Partner Violence: Not At Risk (08/13/2022)   Humiliation, Afraid, Rape, and Kick questionnaire    Fear of Current or Ex-Partner: No    Emotionally Abused: No    Physically Abused: No    Sexually Abused: No    Medications:   Current Outpatient Medications on File Prior to Visit  Medication Sig Dispense Refill   acetaminophen (TYLENOL) 325  MG tablet Take 1-2 tablets (325-650 mg total) by mouth every 4 (four) hours as needed for mild pain.     albuterol (PROVENTIL) (2.5 MG/3ML) 0.083% nebulizer solution Take 3 mLs (2.5 mg total) by nebulization every 4 (four) hours as needed for wheezing or shortness of breath. 75 mL 12   amoxicillin (AMOXIL) 500 MG tablet Take 2 tablets (1,000 mg total) by mouth 2 (two) times daily. 360 tablet 3   aspirin EC 81 MG tablet Take 1 tablet (81 mg total) by mouth daily. Swallow whole. 120 tablet 0   atorvastatin (LIPITOR) 80 MG tablet Take 1 tablet (80 mg total) by mouth daily. 30 tablet 0   dorzolamide-timolol (COSOPT) 22.3-6.8 MG/ML ophthalmic solution Place 1 drop into both eyes in the morning and at bedtime.     fluticasone-salmeterol (ADVAIR HFA) 230-21 MCG/ACT inhaler Inhale 2 puffs into the lungs 2 (two) times daily. 1 each 12   latanoprost (XALATAN) 0.005 % ophthalmic solution Place 1 drop into both eyes at bedtime.     mexiletine (MEXITIL) 250 MG capsule TAKE 1 CAPSULE BY MOUTH 2 TIMES DAILY. 180 capsule 3    Multiple Vitamin (MULTIVITAMIN WITH MINERALS) TABS tablet Take 1 tablet by mouth daily.     nystatin cream (MYCOSTATIN) Apply 1 Application topically 2 (two) times daily. 30 g 3   pantoprazole (PROTONIX) 40 MG tablet Take 1 tablet (40 mg total) by mouth daily before breakfast. 30 tablet 0   sertraline (ZOLOFT) 50 MG tablet Take 1 tablet (50 mg total) by mouth daily. 30 tablet 6   sildenafil (REVATIO) 20 MG tablet Take 1 tablet (20 mg total) by mouth 3 (three) times daily. 90 tablet 11   spironolactone (ALDACTONE) 25 MG tablet Take 1 tablet (25 mg total) by mouth daily. 90 tablet 3   traZODone (DESYREL) 100 MG tablet Take 1 tablet (100 mg total) by mouth at bedtime. 90 tablet 3   warfarin (COUMADIN) 3 MG tablet Take 6 mg (2 tabs) every Monday/Friday and 3 mg (1 tab) all other days or as directed by the advanced heart failure clinic 60 tablet 3   No current facility-administered medications on file prior to visit.    Allergies:   Allergies  Allergen Reactions   Pacerone [Amiodarone] Other (See Comments)    Severe tremors   Percocet [Oxycodone-Acetaminophen] Itching    Physical Exam General: Frail middle-age Caucasian male, seated, in no evident distress Head: head normocephalic and atraumatic.  Neck: supple with no carotid or supraclavicular bruits Cardiovascular: regular rate and rhythm, no murmurs Musculoskeletal: no deformity Skin:  no rash/petichiae Vascular:  Normal pulses all extremities Vitals:   11/30/22 1451  BP: 108/64  Pulse: (!) 34   Neurologic Exam Mental Status: Awake and fully alert. Oriented to place and time. Recent and remote memory intact. Attention span, concentration and fund of knowledge appropriate. Mood and affect appropriate.  Cranial Nerves: Fundoscopic exam reveals sharp disc margins. Pupils equal, briskly reactive to light. Extraocular movements full without nystagmus. Visual fields full to confrontation. Hearing intact. Facial sensation intact. Face,  tongue, palate moves normally and symmetrically.  Motor: Normal bulk and tone. Normal strength in all tested extremity muscles.  Mild left grip weakness.  Diminished fine finger movements on the left.  Orbits right over left upper extremity.  Tone increased slightly in the left leg. Sensory.: intact to touch ,pinprick .position and vibratory sensation.  Coordination: Rapid alternating movements normal in all extremities. Finger-to-nose and heel-to-shin performed accurately bilaterally. Gait and Station:  Arises from chair without difficulty. Stance is normal. Gait demonstrates normal stride length and balance but with slight dragging of the left leg..  Not able to heel, toe and tandem walk .  Reflexes: 1+ and symmetric. Toes downgoing.   NIHSS  0 Modified Rankin  2   ASSESSMENT: 57 year old Caucasian male with recurrent right MCA infarct due to right M1 occlusion x 2 in June 2024 and August 2024 treated with successful mechanical thrombectomy of cardioembolic etiology from atrial fibrillation and cardiomyopathy with patient on LVAD device and anticoagulation with warfarin.  He is doing phenomenally well with only mild residual left hand weakness.     PLAN:I had a long d/w patient about his recent recurrent right MCA stroke and thrombectomy x 2, atrial fibrillation, risk for recurrent stroke/TIAs, personally independently reviewed imaging studies and stroke evaluation results and answered questions.Continue warfarin daily  for secondary stroke prevention with target INR between 2 and 3 due to his LVAD device and atrial fibrillation and maintain strict control of hypertension with blood pressure goal below 130/90, diabetes with hemoglobin A1c goal below 6.5% and lipids with LDL cholesterol goal below 70 mg/dL. I also advised the patient to eat a healthy diet with plenty of whole grains, cereals, fruits and vegetables, exercise regularly and maintain ideal body weight .patient is doing quite well  neurologically and is cleared to drive his car.  Continue close follow-up with Dr. Gala Romney in the heart failure clinic.  Followup in the future with me in 6 months or call earlier if necessary. Greater than 50% of time during this 40 minute visit was spent on counseling,explanation of diagnosis embolic strokes, thrombectomy, heart failure, warfarin anticoagulation, planning of further management, discussion with patient and family and coordination of care Delia Heady, MD Note: This document was prepared with digital dictation and possible smart phrase technology. Any transcriptional errors that result from this process are unintentional

## 2022-12-01 ENCOUNTER — Encounter (HOSPITAL_COMMUNITY): Payer: Self-pay

## 2022-12-05 ENCOUNTER — Ambulatory Visit (HOSPITAL_COMMUNITY): Payer: Self-pay | Admitting: Pharmacist

## 2022-12-05 LAB — POCT INR: INR: 1.4 — AB (ref 2.0–3.0)

## 2022-12-05 NOTE — Progress Notes (Signed)
LVAD INR 

## 2022-12-06 ENCOUNTER — Other Ambulatory Visit (HOSPITAL_COMMUNITY): Payer: Self-pay | Admitting: Internal Medicine

## 2022-12-06 ENCOUNTER — Other Ambulatory Visit (HOSPITAL_COMMUNITY): Payer: Self-pay | Admitting: *Deleted

## 2022-12-06 DIAGNOSIS — E119 Type 2 diabetes mellitus without complications: Secondary | ICD-10-CM | POA: Diagnosis not present

## 2022-12-06 DIAGNOSIS — Z7951 Long term (current) use of inhaled steroids: Secondary | ICD-10-CM | POA: Diagnosis not present

## 2022-12-06 DIAGNOSIS — Z8673 Personal history of transient ischemic attack (TIA), and cerebral infarction without residual deficits: Secondary | ICD-10-CM | POA: Diagnosis not present

## 2022-12-06 DIAGNOSIS — I480 Paroxysmal atrial fibrillation: Secondary | ICD-10-CM | POA: Diagnosis not present

## 2022-12-06 DIAGNOSIS — Z7901 Long term (current) use of anticoagulants: Secondary | ICD-10-CM

## 2022-12-06 DIAGNOSIS — G4700 Insomnia, unspecified: Secondary | ICD-10-CM | POA: Diagnosis not present

## 2022-12-06 DIAGNOSIS — I2510 Atherosclerotic heart disease of native coronary artery without angina pectoris: Secondary | ICD-10-CM | POA: Diagnosis not present

## 2022-12-06 DIAGNOSIS — Z792 Long term (current) use of antibiotics: Secondary | ICD-10-CM | POA: Diagnosis not present

## 2022-12-06 DIAGNOSIS — Z95811 Presence of heart assist device: Secondary | ICD-10-CM

## 2022-12-06 DIAGNOSIS — Z452 Encounter for adjustment and management of vascular access device: Secondary | ICD-10-CM | POA: Diagnosis not present

## 2022-12-06 DIAGNOSIS — I5023 Acute on chronic systolic (congestive) heart failure: Secondary | ICD-10-CM | POA: Diagnosis not present

## 2022-12-06 DIAGNOSIS — Z7982 Long term (current) use of aspirin: Secondary | ICD-10-CM | POA: Diagnosis not present

## 2022-12-06 DIAGNOSIS — I110 Hypertensive heart disease with heart failure: Secondary | ICD-10-CM | POA: Diagnosis not present

## 2022-12-06 DIAGNOSIS — I5022 Chronic systolic (congestive) heart failure: Secondary | ICD-10-CM

## 2022-12-06 DIAGNOSIS — I82712 Chronic embolism and thrombosis of superficial veins of left upper extremity: Secondary | ICD-10-CM | POA: Diagnosis not present

## 2022-12-06 DIAGNOSIS — E785 Hyperlipidemia, unspecified: Secondary | ICD-10-CM | POA: Diagnosis not present

## 2022-12-06 DIAGNOSIS — B952 Enterococcus as the cause of diseases classified elsewhere: Secondary | ICD-10-CM | POA: Diagnosis not present

## 2022-12-06 DIAGNOSIS — D509 Iron deficiency anemia, unspecified: Secondary | ICD-10-CM | POA: Diagnosis not present

## 2022-12-06 DIAGNOSIS — D62 Acute posthemorrhagic anemia: Secondary | ICD-10-CM | POA: Diagnosis not present

## 2022-12-06 DIAGNOSIS — I952 Hypotension due to drugs: Secondary | ICD-10-CM | POA: Diagnosis not present

## 2022-12-06 DIAGNOSIS — G4733 Obstructive sleep apnea (adult) (pediatric): Secondary | ICD-10-CM | POA: Diagnosis not present

## 2022-12-06 DIAGNOSIS — J449 Chronic obstructive pulmonary disease, unspecified: Secondary | ICD-10-CM | POA: Diagnosis not present

## 2022-12-07 ENCOUNTER — Ambulatory Visit (HOSPITAL_COMMUNITY)
Admission: RE | Admit: 2022-12-07 | Discharge: 2022-12-07 | Disposition: A | Discharge status: Home / Self Care | Payer: Medicare Other | Source: Ambulatory Visit | Attending: Internal Medicine | Admitting: Internal Medicine

## 2022-12-07 ENCOUNTER — Encounter (HOSPITAL_COMMUNITY): Payer: Self-pay | Admitting: Internal Medicine

## 2022-12-07 ENCOUNTER — Ambulatory Visit (HOSPITAL_COMMUNITY): Payer: Self-pay | Admitting: Pharmacist

## 2022-12-07 VITALS — BP 110/0 | HR 69 | Wt 171.2 lb

## 2022-12-07 DIAGNOSIS — I428 Other cardiomyopathies: Secondary | ICD-10-CM | POA: Insufficient documentation

## 2022-12-07 DIAGNOSIS — Z95811 Presence of heart assist device: Secondary | ICD-10-CM

## 2022-12-07 DIAGNOSIS — I472 Ventricular tachycardia, unspecified: Secondary | ICD-10-CM | POA: Insufficient documentation

## 2022-12-07 DIAGNOSIS — I252 Old myocardial infarction: Secondary | ICD-10-CM | POA: Insufficient documentation

## 2022-12-07 DIAGNOSIS — I5022 Chronic systolic (congestive) heart failure: Secondary | ICD-10-CM

## 2022-12-07 DIAGNOSIS — Z4509 Encounter for adjustment and management of other cardiac device: Secondary | ICD-10-CM | POA: Insufficient documentation

## 2022-12-07 DIAGNOSIS — Z792 Long term (current) use of antibiotics: Secondary | ICD-10-CM | POA: Insufficient documentation

## 2022-12-07 DIAGNOSIS — F32A Depression, unspecified: Secondary | ICD-10-CM | POA: Insufficient documentation

## 2022-12-07 DIAGNOSIS — J984 Other disorders of lung: Secondary | ICD-10-CM | POA: Diagnosis not present

## 2022-12-07 DIAGNOSIS — I2510 Atherosclerotic heart disease of native coronary artery without angina pectoris: Secondary | ICD-10-CM | POA: Diagnosis not present

## 2022-12-07 DIAGNOSIS — Z8673 Personal history of transient ischemic attack (TIA), and cerebral infarction without residual deficits: Secondary | ICD-10-CM | POA: Insufficient documentation

## 2022-12-07 DIAGNOSIS — Z7901 Long term (current) use of anticoagulants: Secondary | ICD-10-CM

## 2022-12-07 DIAGNOSIS — I110 Hypertensive heart disease with heart failure: Secondary | ICD-10-CM | POA: Diagnosis not present

## 2022-12-07 DIAGNOSIS — K219 Gastro-esophageal reflux disease without esophagitis: Secondary | ICD-10-CM | POA: Diagnosis not present

## 2022-12-07 DIAGNOSIS — I480 Paroxysmal atrial fibrillation: Secondary | ICD-10-CM | POA: Diagnosis not present

## 2022-12-07 LAB — COMPREHENSIVE METABOLIC PANEL
ALT: 36 U/L (ref 0–44)
AST: 34 U/L (ref 15–41)
Albumin: 3.6 g/dL (ref 3.5–5.0)
Alkaline Phosphatase: 84 U/L (ref 38–126)
Anion gap: 7 (ref 5–15)
BUN: 8 mg/dL (ref 6–20)
CO2: 28 mmol/L (ref 22–32)
Calcium: 9.1 mg/dL (ref 8.9–10.3)
Chloride: 108 mmol/L (ref 98–111)
Creatinine, Ser: 0.98 mg/dL (ref 0.61–1.24)
GFR, Estimated: >60 mL/min (ref >60)
Glucose, Bld: 86 mg/dL (ref 70–99)
Potassium: 4.3 mmol/L (ref 3.5–5.1)
Sodium: 143 mmol/L (ref 135–145)
Total Bilirubin: 1 mg/dL (ref <1.2)
Total Protein: 6.2 g/dL — ABNORMAL LOW (ref 6.5–8.1)

## 2022-12-07 LAB — CBC
HCT: 39.3 % (ref 39.0–52.0)
Hemoglobin: 11.8 g/dL — ABNORMAL LOW (ref 13.0–17.0)
MCH: 24.9 pg — ABNORMAL LOW (ref 26.0–34.0)
MCHC: 30 g/dL (ref 30.0–36.0)
MCV: 83.1 fL (ref 80.0–100.0)
Platelets: 222 10*3/uL (ref 150–400)
RBC: 4.73 MIL/uL (ref 4.22–5.81)
RDW: 17.4 % — ABNORMAL HIGH (ref 11.5–15.5)
WBC: 6.5 10*3/uL (ref 4.0–10.5)
nRBC: 0 % (ref 0.0–0.2)

## 2022-12-07 LAB — PROTIME-INR
INR: 2 — ABNORMAL HIGH (ref 0.8–1.2)
Prothrombin Time: 22.7 s — ABNORMAL HIGH (ref 11.4–15.2)

## 2022-12-07 LAB — PREALBUMIN: Prealbumin: 21 mg/dL (ref 18–38)

## 2022-12-07 LAB — LACTATE DEHYDROGENASE: LDH: 209 U/L — ABNORMAL HIGH (ref 98–192)

## 2022-12-07 NOTE — Patient Instructions (Signed)
No medication changes today Return in 1 week to assess readiness to shower Follow up in VAD Clinic in 2 months

## 2022-12-07 NOTE — Progress Notes (Signed)
Pt presents for 1 month follow up with 3 month Intermacs in VAD Clinic today alone. Reports no problems with VAD equipment or concerns with drive line.   Pt ambulated into clinic independently without walker. States he is feeling a lot stronger. He still has limitation with his left arm but continues strengthening exercises. He reports now being able to change power sources independently without adaptive grips. He has almost completed [facility] PT/OT.  Pt cleared to drive by Neurologist last week. He is now living back at his home alone without issues.  Denies lightheadedness, dizziness, falls, shortness of breath, and signs of bleeding. He is taking all medication as prescribed. Will continue Aspirin 81mg  due to recent CVA. Plan for PET scan 1/8 to reassess for sarcoid. Discussed with Dr. Gala Romney will continue to hold Methotrexate until after PET scan.  Blood cultures from 9/6 positive for Enterococcus Faecalis. Completed 6 weeks of IV Ampicillin and Ceftriaxone 10/19. Now on chronic suppressive Amoxicillin 1000 mg BID.   Vital Signs:  Doppler Pressure: 110 Automatic BP: 110/82 (93) HR: 69 NSR SPO2: 97% RA   Weight: 171.2 lb w/ eqt Discharge weight: 164 lb   VAD Indication: Destination Therapy - sarcoid   LVAD assessment: HM III  Primary Controller: VAD Speed: 5400 rpms Flow: 4.2 Power: 3.9 w    PI: 4.5 Alarms: none Events: 10-20 daily  Fixed speed: 5400 Low speed limit: 5100  Primary controller: back up battery due for replacement in 28 months Secondary controller: back up battery due for replacement in 28 months   I reviewed the LVAD parameters from today and compared the results to the patient's prior recorded data. LVAD interrogation was NEGATIVE for significant power changes, NEGATIVE for clinical alarms and STABLE for PI events/speed drops. No programming changes were made and pump is functioning within specified parameters. Pt is performing daily controller and system monitor  self tests along with completing weekly and monthly maintenance for LVAD equipment.   LVAD equipment check completed and is in good working order. Back-up equipment not present at today's visit.   Annual Equipment Maintenance on UBC/PM was performed on 08/29/22.  Exit Site Care:  Existing VAD dressing removed and site care performed using sterile technique. Drive line exit site cleaned with Chlora prep applicators x 2, allowed to dry, and Sorbaview dressing with Silverlon patch applied. Exit site healed and incorporated, the velour is fully implanted at exit site. No redness, tenderness, drainage, foul odor or rash noted. Drive line anchor re-applied. Pt denies fever or chills.    Return in 1 week for dressing teaching with [name] and to assess readiness to shower.  Patient completed 1300 feet during 6 minute walk test. Tolerated well.   Neurocognitive trail making completed correctly in 2 m 36 s.   Significant Events on VAD Support:  08/30/22: Acute infarct seen on the right temporal cortex and basal ganglia    Device: Medtronic Therapies: ON Last check: 06/13/22   BP & Labs:  MAP 110 - Doppler is reflecting MAP   Hgb 11.8 - No S/S of bleeding. Specifically denies melena/BRBPR or nosebleeds.   LDH stable at 209 with established baseline of 180- 245. Denies tea-colored urine. No power elevations noted on interrogation.    Plan:  No medication changes today Return in 1 week to assess readiness to shower Follow up in VAD Clinic in 2 months  Simmie Davies RN,BSN VAD Coordinator  Office: [phone]  24/7 Pager: [phone]

## 2022-12-08 DIAGNOSIS — I82712 Chronic embolism and thrombosis of superficial veins of left upper extremity: Secondary | ICD-10-CM | POA: Diagnosis not present

## 2022-12-08 DIAGNOSIS — D62 Acute posthemorrhagic anemia: Secondary | ICD-10-CM | POA: Diagnosis not present

## 2022-12-08 DIAGNOSIS — Z792 Long term (current) use of antibiotics: Secondary | ICD-10-CM | POA: Diagnosis not present

## 2022-12-08 DIAGNOSIS — I110 Hypertensive heart disease with heart failure: Secondary | ICD-10-CM | POA: Diagnosis not present

## 2022-12-08 DIAGNOSIS — J449 Chronic obstructive pulmonary disease, unspecified: Secondary | ICD-10-CM | POA: Diagnosis not present

## 2022-12-08 DIAGNOSIS — Z452 Encounter for adjustment and management of vascular access device: Secondary | ICD-10-CM | POA: Diagnosis not present

## 2022-12-08 DIAGNOSIS — E119 Type 2 diabetes mellitus without complications: Secondary | ICD-10-CM | POA: Diagnosis not present

## 2022-12-08 DIAGNOSIS — I480 Paroxysmal atrial fibrillation: Secondary | ICD-10-CM | POA: Diagnosis not present

## 2022-12-08 DIAGNOSIS — D509 Iron deficiency anemia, unspecified: Secondary | ICD-10-CM | POA: Diagnosis not present

## 2022-12-08 DIAGNOSIS — Z95811 Presence of heart assist device: Secondary | ICD-10-CM | POA: Diagnosis not present

## 2022-12-08 DIAGNOSIS — E785 Hyperlipidemia, unspecified: Secondary | ICD-10-CM | POA: Diagnosis not present

## 2022-12-08 DIAGNOSIS — Z7982 Long term (current) use of aspirin: Secondary | ICD-10-CM | POA: Diagnosis not present

## 2022-12-08 DIAGNOSIS — Z7951 Long term (current) use of inhaled steroids: Secondary | ICD-10-CM | POA: Diagnosis not present

## 2022-12-08 DIAGNOSIS — B952 Enterococcus as the cause of diseases classified elsewhere: Secondary | ICD-10-CM | POA: Diagnosis not present

## 2022-12-08 DIAGNOSIS — G4733 Obstructive sleep apnea (adult) (pediatric): Secondary | ICD-10-CM | POA: Diagnosis not present

## 2022-12-08 DIAGNOSIS — I952 Hypotension due to drugs: Secondary | ICD-10-CM | POA: Diagnosis not present

## 2022-12-08 DIAGNOSIS — Z7901 Long term (current) use of anticoagulants: Secondary | ICD-10-CM | POA: Diagnosis not present

## 2022-12-08 DIAGNOSIS — Z8673 Personal history of transient ischemic attack (TIA), and cerebral infarction without residual deficits: Secondary | ICD-10-CM | POA: Diagnosis not present

## 2022-12-08 DIAGNOSIS — I2510 Atherosclerotic heart disease of native coronary artery without angina pectoris: Secondary | ICD-10-CM | POA: Diagnosis not present

## 2022-12-08 DIAGNOSIS — I5023 Acute on chronic systolic (congestive) heart failure: Secondary | ICD-10-CM | POA: Diagnosis not present

## 2022-12-08 DIAGNOSIS — G4700 Insomnia, unspecified: Secondary | ICD-10-CM | POA: Diagnosis not present

## 2022-12-08 NOTE — Progress Notes (Signed)
LVAD CLINIC NOTE  PCP: [name], PA-C Cardiologist: Norman Herrlich, MD HF MD:    HPI:  Mr Vanwhy is a 57 y.o. 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 Underwent HM- 3 LVAD implant on 08/13 + clipping left atrial appendage    Admitted to [facility] 11/21 with CP. Found to have VT and EF 25-30% mild AI, MR and TR. Transferred to [facility]. Cath showed 70% prox LAD with no intervention.    S/P CRT-D upgrade 06/08/21.   6/24, admitted w/ acute CVA due to right M1 occlusion treated w/ mechanical thrombectomy.   Echo showed LV markedly dilated EF < 20% and small effusion. RV ok. CVA felt to be cardioembolic.   Admitted in 7/24 for low-output HF. Angiography showed mild nonobstructive CAD. RHC hemodynamics c/w severe NICM w/ severely elevated filling pressures and low output state c/w cardiogenic shock. Repeat PFTs w/ severe obstructive defect (FEV1 1.04L, FEV1/FVC 48%) +response to bronchodilator.   Underwent LVAD implant on 08/13 + clipping left atrial appendage d/t severe thickening and invagination of mitral valve annulus impeding flows. Apical core sent to pathology - no mention of sarcoid.   Post VAD implant c/b left-sided hemiplegia. CT head 8/14 with acute R MCA infarct. Taken to IR for thrombectomy. F/u head CT with small hemorrhagic conversion. Had some residual deficits w/ mild left sided weakness but otherwise recovered. Course further c/b enterococcus faecalis bacteremia w/ 2/2 + cultures on 9/6. ID consulted. Recommended ampicillin and ceftriaxone. PICC removed. Repeat BC X 2 09/08 NGTD. Will need 6 wks total therapy. EOD 11/04/22.    Transferred to [facility].   Here for post-hospital f/u. Continues to get stronger. Now living alone and independent. Denies orthopnea or PND. No fevers, chills or problems with driveline. No bleeding, melena. Mild residual deficit in LUE extremity but not limiting. No VAD alarms. Taking all meds as  prescribed.     VAD Indication: Destination Therapy - sarcoid   LVAD assessment: HM III   Primary Controller: VAD Speed: 5400 rpms Flow: 4.2 Power: 3.9 w    PI: 4.5 Alarms: none Events: 10-20 daily  Fixed speed: 5400 Low speed limit: 5100   Primary controller: back up battery due for replacement in 28 months Secondary controller: back up battery due for replacement in 28 months   I reviewed the LVAD parameters from today and compared the results to the patient's prior recorded data. LVAD interrogation was NEGATIVE for significant power changes, NEGATIVE for clinical alarms and STABLE for PI events/speed drops. No programming changes were made and pump is functioning within specified parameters. Pt is performing daily controller and system monitor self tests along with completing weekly and monthly maintenance for LVAD equipment.   LVAD equipment check completed and is in good working order. Back-up equipment not present at today's visit.   Annual Equipment Maintenance on UBC/PM was performed on 08/29/22.   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 possible cardiac sarcoidosis.   Vaccine counseling 11/19/2022   Wide-complex tachycardia 2021   wears LifeVest    Current Outpatient Medications  Medication Sig Dispense Refill   acetaminophen (TYLENOL) 325 MG tablet Take 1-2 tablets (325-650 mg total) by mouth every 4 (four) hours as needed for mild pain.     amoxicillin (AMOXIL) 500 MG  tablet Take 2 tablets (1,000 mg total) by mouth 2 (two) times daily. 360 tablet 3   aspirin EC 81 MG tablet Take 1 tablet (81 mg total) by mouth daily. Swallow whole. 120 tablet 0   atorvastatin (LIPITOR) 80 MG tablet Take 1 tablet (80 mg total) by mouth daily. 30 tablet 0   dorzolamide-timolol (COSOPT) 22.3-6.8 MG/ML ophthalmic solution Place 1 drop into both eyes in the morning and at bedtime.     fluticasone-salmeterol (ADVAIR HFA) 230-21 MCG/ACT inhaler Inhale 2 puffs into the lungs 2 (two) times daily. 1 each 12   latanoprost (XALATAN) 0.005 % ophthalmic solution Place 1 drop into both eyes at bedtime.     mexiletine (MEXITIL) 250 MG capsule TAKE 1 CAPSULE BY MOUTH 2 TIMES DAILY. 180 capsule 3   Multiple Vitamin (MULTIVITAMIN WITH MINERALS) TABS tablet Take 1 tablet by mouth daily.     nystatin cream (MYCOSTATIN) Apply 1 Application topically 2 (two) times daily. 30 g 3   pantoprazole (PROTONIX) 40 MG tablet Take 1 tablet (40 mg total) by mouth daily before breakfast. 30 tablet 0   sertraline (ZOLOFT) 50 MG tablet TAKE 1 TABLET BY MOUTH EVERY DAY 90 tablet 3   sildenafil (REVATIO) 20 MG tablet Take 1 tablet (20 mg total) by mouth 3 (three) times daily. 90 tablet 11   spironolactone (ALDACTONE) 25 MG tablet Take 1 tablet (25 mg total) by mouth daily. 90 tablet 3   traZODone (DESYREL) 100 MG tablet Take 1 tablet (100 mg total) by mouth at bedtime. 90 tablet 3   warfarin (COUMADIN) 3 MG tablet Take 6 mg (2 tabs) every Monday/Friday and 3 mg (1 tab) all other days or as directed by the advanced heart failure clinic 60 tablet 3   albuterol (PROVENTIL) (2.5 MG/3ML) 0.083% nebulizer solution Take 3 mLs (2.5 mg total) by nebulization every 4 (four) hours as needed for wheezing or shortness of breath. (Patient not taking: Reported on 12/07/2022) 75 mL 12   No current facility-administered medications for this encounter.    Pacerone [amiodarone] and Percocet [oxycodone-acetaminophen]    Vital  Signs:  Doppler Pressure: 110 Automatic BP: 110/82 (93) HR: 69 NSR SPO2: 97% RA   Weight: 171.2 lb w/ eqt Discharge weight: 164 lb  Vitals:   12/07/22 1708 12/07/22 1709  BP: 110/82 (!) 110/0  Pulse: 69   SpO2: 97%   Weight: 77.7 kg (171 lb 3.2 oz)     Physical Exam: General:  NAD.  HEENT: normal  Neck: supple. JVP not elevated.  Carotids 2+ bilat; no bruits. No lymphadenopathy or thryomegaly appreciated. Cor: LVAD hum.  Lungs: Clear. Abdomen: soft, nontender, non-distended. No hepatosplenomegaly. No bruits or masses. Good bowel sounds. Driveline site clean. Anchor in place.  Extremities: no cyanosis, clubbing, rash. Warm no edema  Neuro: alert & oriented x 3. Mild LUE weakness. Moves all 4 without problem    ASSESSMENT AND PLAN:   1.  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 Presented with VT. LHC 70% LAD  - cMRI 12/21 concerning for sarcoid and EF 18%.  - PET 2/22 at [facility] EF 25% +  active sarcoid - Echo 08/26/20 EF < 20% severely dilated LV RV mildly decreased.  - Medtronic CRT-D upgrade in 06/08/21 - Echo 07/10/22: EF <20%, RV okay, mod pericardial effusion, mod MR/TR - Nonobstructive CAD, severely elevated filling pressures and low Fick CO/CI (2.7/1.4) - 08/29/22 HM III LVAD implant + clipping LAA d/t severe thickening and invagination of mitral valve annulus impeding flows.  - Doing well with VAD support. Continues to get stronger. NYHA I - Volume status ok - Continue spiro 25 daily   2. HM-3 LVAD - VAD interrogated personally. Parameters stable.. - LDH 228 - DL site OK. Fungal rash resolved with nystatin cream - MAPs ok - Hgb 11.8 - INR 2.0. Goal 2.0-3.0. ASA 81 mg daily (on for CVA). Discussed warfarin dosing with PharmD personally.   3.  H/o stroke - Admitted 06/24 w/ R MCA stroke. S/p TPA and mechanical clot extraction. No residual deficits. Likely cardioembolic in setting of severe LV dysfunction. - Developed left sided weakness 08/14.  CTA with R MCA infarct. Taken to IR for thrombectomy - Repeat CT head with small to moderate size hemorrhagic conversion.  - Recovered well with just mild LUE weakness. Recently saw Neuro - Continue warfarin/ASA  4. Enterococcus faecalis bacteremia - Bcx 2/2 on 9/6 - ID consult 9/6 -> ampicillin and ceftriaxone - Echo 09/10 - no obvious vegetations - Completed 6 weeks of IV Ampicillin and Ceftriaxone. Now on chronic suppression with Amoxicillin. - No infectious symptoms   5. Hx VT - ln setting of potential sarcoid heart disease  - Off amio due to tremor. Continue mexiletine  - now s/p ICD. - No VT on ICD   6. CAD - LHC 12/07/19 70-% LAD, no intervention - LHC 8/24 non obstructive CAD.  - Continue statin. On aspirin for VAD. - No s/s sarcoid   7. 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  - holding methotrexate w/ recent surgery and active infection, can discuss timing of restarting the medication at outpatient follow-up - apical core pathology not diagnostic of cardiac sarcoidosis.  - pending repeat PET. Will keep off methotrexate for now   8. 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 Pulmonary  - PFTs with severe obstructive defect, response to bronchodilator. FEV1 1.04L, FEV1/FVC 48% - stable   10. Depression - Mood much improved - Continue remeron and sertraline  I spent a total of 40 minutes today: 1) reviewing the patient's medical records including previous charts, labs and recent notes from other providers; 2) examining the patient and counseling them on their medical issues/explaining the plan of care; 3) adjusting meds as needed and 4) ordering lab work or other needed tests.     Arvilla Meres, MD  12:14 AM

## 2022-12-11 ENCOUNTER — Ambulatory Visit (HOSPITAL_COMMUNITY)
Admission: RE | Admit: 2022-12-11 | Discharge: 2022-12-11 | Disposition: A | Discharge status: Home / Self Care | Payer: Medicare Other | Source: Ambulatory Visit | Attending: Internal Medicine | Admitting: Internal Medicine

## 2022-12-11 ENCOUNTER — Other Ambulatory Visit (HOSPITAL_COMMUNITY): Payer: Self-pay

## 2022-12-11 DIAGNOSIS — Z7901 Long term (current) use of anticoagulants: Secondary | ICD-10-CM | POA: Diagnosis not present

## 2022-12-11 DIAGNOSIS — Z4801 Encounter for change or removal of surgical wound dressing: Secondary | ICD-10-CM | POA: Insufficient documentation

## 2022-12-11 DIAGNOSIS — Z95811 Presence of heart assist device: Secondary | ICD-10-CM | POA: Insufficient documentation

## 2022-12-11 NOTE — Progress Notes (Signed)
Pt presented to VAD Clinic with [name] for weekly dressing change teaching.   Exit Site Care:  Existing VAD dressing removed and site care performed using sterile technique by [facility]. Drive line exit site cleaned with Chlora prep applicators x 2, allowed to dry, and Sorbaview dressing with Silverlon patch applied. Exit site healed and incorporated, the velour is fully implanted at exit site. No redness, tenderness, drainage, foul odor or rash noted. Drive line anchor re-applied. Large tegaderm placed x 2. Pt denies fever or chills. Pt given 8 weekly kits for home use.     Provided patient with shower bag for home use. Demonstration along with written instructions and illustrated steps provided. Patient and caregiver verbalized understanding of same. Pt advised to please use shower chair for safety. Pt states his sister has shower chair he is able to use.   Here are some tips for washing:  Avoid getting the driveline exit site dressing wet, and consider planning bathing times around exit site dressing changes. Use only the shower bag we gave you to shower with and don't get creative with your equipment to shower. Water and electricity do not mix and will cause your pump to stop.   Sit on a chair or bathing stool in the bathtub or shower stall, and use a basin of warm water and washcloth or sponge to wash. Or, wash at the sink while standing on a towel, so as not to get the floor or bath rug wet. To wash your hair, try using a hand-held shower wand or sprayer while standing over the kitchen sink or bathtub. Be sure that all floor surfaces are dry when walking around after bathing, to avoid slipping. Do not use powder around the exit site dressing. Anytime your dressing appears wet CHANGE IT! Drink 2 large glasses of water prior to getting in shower for first time (always hydrate before shower). Caregiver needs to be accessible during first few showers. Call VAD Coordinator if any changes  in appearance of drive line site.     Charles Davies RN,BSN VAD Coordinator  Office: [phone]  24/7 Pager: [phone]

## 2022-12-11 NOTE — Progress Notes (Signed)
Pt completed 3 month Intermacs today. See below. performed at visit last week. See separate note for details.  [place] Cardiomyopathy Questionnaire     12/11/2022   12:17 PM  KCCQ-12  1 a. Ability to shower/bathe Not at all limited  1 b. Ability to walk 1 block Not at all limited  1 c. Ability to hurry/jog Quite a bit limited  2. Edema feet/ankles/legs Less than once a week  3. Limited by fatigue 1-2 times a week  4. Limited by dyspnea Less than once a week  5. Sitting up / on 3+ pillows Never over the past 2 weeks  6. Limited enjoyment of life Slightly limited  7. Rest of life w/ symptoms Mostly dissatisfied  8 a. Participation in hobbies Slightly limited  8 b. Participation in chores Slightly limited  8 c. Visiting family/friends N/A, did not do for other reasons     Ernesta Amble, BSN VAD Coordinator 24/7 Pager [phone]

## 2022-12-11 NOTE — Patient Instructions (Signed)
Provided patient with shower bag for home use. Demonstration along with written instructions and illustrated steps provided.  Patient and caregiver verbalized understanding of same.    Here are some tips for washing:  Avoid getting the driveline exit site dressing wet, and consider planning bathing times around exit site dressing changes. Use only the shower bag we gave you to shower with and don't get creative with your equipment to shower. Water and electricity do not mix and will cause your pump to stop.   Sit on a chair or bathing stool in the bathtub or shower stall, and use a basin of warm water and washcloth or sponge to wash. Or, wash at the sink while standing on a towel, so as not to get the floor or bath rug wet. To wash your hair, try using a hand-held shower wand or sprayer while standing over the kitchen sink or bathtub. Be sure that all floor surfaces are dry when walking around after bathing, to avoid slipping. Do not use powder around the exit site dressing. Anytime your dressing appears wet CHANGE IT! Drink 2 large glasses of water prior to getting in shower for first time (always hydrate before shower). Caregiver needs to be accessible during first few showers. Call VAD Coordinator if any changes in appearance of drive line site.

## 2022-12-12 ENCOUNTER — Ambulatory Visit (INDEPENDENT_AMBULATORY_CARE_PROVIDER_SITE_OTHER): Payer: Medicare Other

## 2022-12-12 DIAGNOSIS — I5022 Chronic systolic (congestive) heart failure: Secondary | ICD-10-CM | POA: Diagnosis not present

## 2022-12-12 LAB — CUP PACEART REMOTE DEVICE CHECK
Battery Remaining Longevity: 68 mo
Battery Voltage: 2.99 V
Brady Statistic AP VP Percent: 0.56 %
Brady Statistic AP VS Percent: 0.03 %
Brady Statistic AS VP Percent: 97.25 %
Brady Statistic AS VS Percent: 2.16 %
Brady Statistic RA Percent Paced: 0.59 %
Brady Statistic RV Percent Paced: 96.64 %
Date Time Interrogation Session: 20241126033426
HighPow Impedance: 53 Ohm
Implantable Lead Connection Status: 753985
Implantable Lead Connection Status: 753985
Implantable Lead Connection Status: 753985
Implantable Lead Implant Date: 20220204
Implantable Lead Implant Date: 20220204
Implantable Lead Implant Date: 20230523
Implantable Lead Location: 753858
Implantable Lead Location: 753859
Implantable Lead Location: 753860
Implantable Lead Model: 4598
Implantable Lead Model: 5076
Implantable Pulse Generator Implant Date: 20230523
Lead Channel Impedance Value: 160.941
Lead Channel Impedance Value: 168.889
Lead Channel Impedance Value: 179.282
Lead Channel Impedance Value: 180 Ohm
Lead Channel Impedance Value: 191.854
Lead Channel Impedance Value: 247 Ohm
Lead Channel Impedance Value: 304 Ohm
Lead Channel Impedance Value: 304 Ohm
Lead Channel Impedance Value: 342 Ohm
Lead Channel Impedance Value: 380 Ohm
Lead Channel Impedance Value: 380 Ohm
Lead Channel Impedance Value: 437 Ohm
Lead Channel Impedance Value: 532 Ohm
Lead Channel Impedance Value: 570 Ohm
Lead Channel Impedance Value: 589 Ohm
Lead Channel Impedance Value: 627 Ohm
Lead Channel Impedance Value: 646 Ohm
Lead Channel Impedance Value: 665 Ohm
Lead Channel Pacing Threshold Amplitude: 0.5 V
Lead Channel Pacing Threshold Amplitude: 0.875 V
Lead Channel Pacing Threshold Amplitude: 1.375 V
Lead Channel Pacing Threshold Pulse Width: 0.4 ms
Lead Channel Pacing Threshold Pulse Width: 0.4 ms
Lead Channel Pacing Threshold Pulse Width: 0.8 ms
Lead Channel Sensing Intrinsic Amplitude: 0.875 mV
Lead Channel Sensing Intrinsic Amplitude: 0.875 mV
Lead Channel Sensing Intrinsic Amplitude: 1.625 mV
Lead Channel Sensing Intrinsic Amplitude: 1.625 mV
Lead Channel Setting Pacing Amplitude: 1.5 V
Lead Channel Setting Pacing Amplitude: 2 V
Lead Channel Setting Pacing Amplitude: 2.5 V
Lead Channel Setting Pacing Pulse Width: 0.4 ms
Lead Channel Setting Pacing Pulse Width: 0.8 ms
Lead Channel Setting Sensing Sensitivity: 0.3 mV

## 2022-12-13 ENCOUNTER — Ambulatory Visit (HOSPITAL_COMMUNITY): Payer: Self-pay | Admitting: Pharmacist

## 2022-12-13 LAB — POCT INR: INR: 2.6 (ref 2.0–3.0)

## 2022-12-19 ENCOUNTER — Encounter (HOSPITAL_COMMUNITY): Payer: Self-pay

## 2022-12-19 ENCOUNTER — Telehealth (HOSPITAL_COMMUNITY): Payer: Self-pay | Admitting: *Deleted

## 2022-12-19 NOTE — Telephone Encounter (Signed)
Reaching out to patient to offer assistance regarding upcoming cardiac imaging study; pt verbalizes understanding of appt date/time, parking situation and where to check in, pre-test NPO status and verified current allergies; name and call back number provided for further questions should they arise  Charles Brick RN Navigator Cardiac Imaging [facility] [phone] office [phone] cell  Patient verbalizes understanding of diet prep.

## 2022-12-20 ENCOUNTER — Ambulatory Visit (HOSPITAL_COMMUNITY): Payer: Self-pay | Admitting: Pharmacist

## 2022-12-20 ENCOUNTER — Telehealth: Payer: Self-pay | Admitting: *Deleted

## 2022-12-20 LAB — POCT INR: INR: 2.5 (ref 2.0–3.0)

## 2022-12-21 ENCOUNTER — Ambulatory Visit (HOSPITAL_COMMUNITY)
Admission: RE | Admit: 2022-12-21 | Discharge: 2022-12-21 | Disposition: A | Discharge status: Home / Self Care | Payer: Medicare Other | Source: Ambulatory Visit | Attending: Cardiology | Admitting: Cardiology

## 2022-12-21 DIAGNOSIS — I5022 Chronic systolic (congestive) heart failure: Secondary | ICD-10-CM | POA: Diagnosis not present

## 2022-12-21 MED ORDER — FLUDEOXYGLUCOSE F - 18 (FDG) INJECTION
8.9300 | Freq: Once | INTRAVENOUS | Status: AC
  Administered 2022-12-21: 8.93 via INTRAVENOUS

## 2022-12-21 MED ORDER — RUBIDIUM RB82 GENERATOR (RUBYFILL)
19.5200 | PACK | Freq: Once | INTRAVENOUS | Status: AC
  Administered 2022-12-21: 19.52 via INTRAVENOUS

## 2022-12-22 LAB — NM PET CT MYOCARDIAL SARCOIDOSIS
Nuc Stress EF: 20 %
Rest Nuclear Isotope Dose: 19.5 mCi

## 2022-12-27 ENCOUNTER — Ambulatory Visit (HOSPITAL_COMMUNITY): Payer: Self-pay | Admitting: Pharmacist

## 2022-12-27 LAB — POCT INR: INR: 2.1 (ref 2.0–3.0)

## 2023-01-02 ENCOUNTER — Ambulatory Visit (HOSPITAL_COMMUNITY)
Admission: RE | Admit: 2023-01-02 | Discharge: 2023-01-02 | Disposition: A | Discharge status: Home / Self Care | Payer: Medicare Other | Source: Ambulatory Visit | Attending: Internal Medicine | Admitting: Internal Medicine

## 2023-01-02 DIAGNOSIS — Z4801 Encounter for change or removal of surgical wound dressing: Secondary | ICD-10-CM | POA: Insufficient documentation

## 2023-01-02 DIAGNOSIS — Z95811 Presence of heart assist device: Secondary | ICD-10-CM

## 2023-01-02 NOTE — Progress Notes (Signed)
Pt presented to VAD Clinic alone for dressing change.   Pt called VAD office this afternoon and reported he has had a itchy rash underneath his dressing for the last week. Advised to come to VAD clinic for dressing change.   Exit Site Care:  Existing VAD dressing removed and site care performed using sterile technique by [facility]. Drive line exit site cleaned with Chlora prep applicators x 2, allowed to dry, and rinsed with saline. Covered with gauze dressing. Exit site healed and incorporated, the velour is fully implanted at exit site. No redness, tenderness, drainage, foul odor or rash noted. Drive line anchor re-applied. Pt denies fever or chills. Pt given 7 daily kits and 7 anchors for home use.        Plan:  Change dressing as needed/ weekly with daily kit until rash heals. . May cleanse rash area with VASHE moistened 2 x 2 gauze. Coat rash area with Nystatin cream per Dr Gala Romney. Do not put cream directly at exit site.  When changing dressing cover rash area with extra 2 x 2 and tape.  If rash does not improve notify VAD coordinators  [name] RN VAD Coordinator  Office: [phone]  24/7 Pager: [phone]

## 2023-01-03 ENCOUNTER — Other Ambulatory Visit: Payer: Self-pay

## 2023-01-03 ENCOUNTER — Ambulatory Visit (HOSPITAL_COMMUNITY): Payer: Self-pay | Admitting: Pharmacist

## 2023-01-03 LAB — POCT INR: INR: 2.2 (ref 2.0–3.0)

## 2023-01-03 NOTE — Patient Outreach (Signed)
 First telephone outreach attempt to obtain mRS. No answer. Left message for returned call.  Vanice Sarah Care Management Assistant [phone]

## 2023-01-05 ENCOUNTER — Other Ambulatory Visit: Payer: Self-pay

## 2023-01-05 NOTE — Patient Outreach (Signed)
 Second telephone outreach attempt to obtain mRS. No answer. Left message for returned call.  Vanice Sarah Care Management Assistant [phone]

## 2023-01-05 NOTE — Patient Outreach (Signed)
Patient called back, mRS was successfully completed. MRS= 1  [facility] Care Management Assistant [phone]

## 2023-01-09 ENCOUNTER — Ambulatory Visit (HOSPITAL_COMMUNITY): Payer: Self-pay | Admitting: Pharmacist

## 2023-01-09 LAB — POCT INR: INR: 2.2 (ref 2.0–3.0)

## 2023-01-11 ENCOUNTER — Ambulatory Visit (HOSPITAL_COMMUNITY): Payer: Self-pay | Admitting: Student-PharmD

## 2023-01-11 ENCOUNTER — Ambulatory Visit (HOSPITAL_COMMUNITY)
Admission: RE | Admit: 2023-01-11 | Discharge: 2023-01-11 | Disposition: A | Discharge status: Home / Self Care | Payer: Medicare Other | Source: Ambulatory Visit | Attending: Cardiology | Admitting: Cardiology

## 2023-01-11 ENCOUNTER — Other Ambulatory Visit (HOSPITAL_COMMUNITY): Payer: Self-pay | Admitting: Unknown Physician Specialty

## 2023-01-11 DIAGNOSIS — R319 Hematuria, unspecified: Secondary | ICD-10-CM | POA: Insufficient documentation

## 2023-01-11 DIAGNOSIS — Z7901 Long term (current) use of anticoagulants: Secondary | ICD-10-CM | POA: Insufficient documentation

## 2023-01-11 DIAGNOSIS — Z95811 Presence of heart assist device: Secondary | ICD-10-CM | POA: Diagnosis not present

## 2023-01-11 DIAGNOSIS — Z95810 Presence of automatic (implantable) cardiac defibrillator: Secondary | ICD-10-CM | POA: Diagnosis present

## 2023-01-11 LAB — CBC
HCT: 42.1 % (ref 39.0–52.0)
Hemoglobin: 12.8 g/dL — ABNORMAL LOW (ref 13.0–17.0)
MCH: 24.5 pg — ABNORMAL LOW (ref 26.0–34.0)
MCHC: 30.4 g/dL (ref 30.0–36.0)
MCV: 80.7 fL (ref 80.0–100.0)
Platelets: 226 10*3/uL (ref 150–400)
RBC: 5.22 MIL/uL (ref 4.22–5.81)
RDW: 17.2 % — ABNORMAL HIGH (ref 11.5–15.5)
WBC: 6.5 10*3/uL (ref 4.0–10.5)
nRBC: 0 % (ref 0.0–0.2)

## 2023-01-11 LAB — URINALYSIS, ROUTINE W REFLEX MICROSCOPIC
Bilirubin Urine: NEGATIVE
Glucose, UA: NEGATIVE mg/dL
Ketones, ur: NEGATIVE mg/dL
Leukocytes,Ua: NEGATIVE
Nitrite: NEGATIVE
Protein, ur: 30 mg/dL — AB
RBC / HPF: >50 RBC/hpf (ref 0–5)
Specific Gravity, Urine: 1.027 (ref 1.005–1.030)
pH: 5 (ref 5.0–8.0)

## 2023-01-11 LAB — BASIC METABOLIC PANEL
Anion gap: 8 (ref 5–15)
BUN: 13 mg/dL (ref 6–20)
CO2: 26 mmol/L (ref 22–32)
Calcium: 8.9 mg/dL (ref 8.9–10.3)
Chloride: 104 mmol/L (ref 98–111)
Creatinine, Ser: 1 mg/dL (ref 0.61–1.24)
GFR, Estimated: >60 mL/min (ref >60)
Glucose, Bld: 81 mg/dL (ref 70–99)
Potassium: 4.2 mmol/L (ref 3.5–5.1)
Sodium: 138 mmol/L (ref 135–145)

## 2023-01-11 LAB — PROTIME-INR
INR: 2.5 — ABNORMAL HIGH (ref 0.8–1.2)
Prothrombin Time: 27 s — ABNORMAL HIGH (ref 11.4–15.2)

## 2023-01-11 LAB — LACTATE DEHYDROGENASE: LDH: 201 U/L — ABNORMAL HIGH (ref 98–192)

## 2023-01-11 NOTE — Progress Notes (Addendum)
Pt presented to VAD Clinic alone for dressing change, labs and u/a.   Pt paged VAD office this morning and reported he has hematuria. Pt continues to c/o itchy rash around his driveline.  Pt tells me that he thinks his hematuria has cleared. Pt denies any pain or other urinary issues. Hgb today is higher than hgb 1 mo ago. All labs WNL. U/a resulted with large hgb. D/w Dr Gala Romney will add on culture and cytology. Per Dr Gala Romney will refer to urology. Referral placed by phone to [facility] in [place]. Appt made for Monday January 6 @ 1030.   Exit Site Care:  Existing VAD dressing removed and site care performed using sterile technique by [facility]. Drive line exit site cleaned with betadine x 2, allowed to dry, and rinsed with saline. Covered with gauze dressing. Exit site healed and incorporated, the velour is fully implanted at exit site. No redness, tenderness, drainage, foul odor or rash noted. Rash is much better today, but moving forward pt should clean with betadine or vashe. Pt given betadine swabs today. VAD coordinator called Charles Holmes and educated her on using betadine. Drive line anchor re-applied. Pt denies fever or chills. Pt given 7 daily kits and 7 anchors for home use.   Unable to get pic to load into note.   Plan:  Change dressing as needed/ weekly with daily kit until rash heals. May cleanse rash area with VASHE moistened 2 x 2 gauze. Coat rash area with Nystatin cream per Dr Gala Romney. Do not put cream directly at exit site.  When changing dressing cover rash area with extra 2 x 2 and tape.  If rash does not improve notify VAD coordinators  Carlton Adam RN VAD Coordinator  Office: [phone]  24/7 Pager: [phone]

## 2023-01-11 NOTE — Addendum Note (Signed)
Encounter addended by: Lebron Quam, RN on: 01/11/2023 3:15 PM  Actions taken: Visit diagnoses modified, Order list changed, Diagnosis association updated, Clinical Note Signed

## 2023-01-11 NOTE — Addendum Note (Signed)
Encounter addended by: Lebron Quam, RN on: 01/11/2023 1:18 PM  Actions taken: Clinical Note Signed

## 2023-01-12 LAB — URINE CULTURE: Culture: NO GROWTH

## 2023-01-15 NOTE — Progress Notes (Signed)
Remote ICD transmission.   

## 2023-01-16 ENCOUNTER — Ambulatory Visit (HOSPITAL_COMMUNITY): Payer: Self-pay | Admitting: Pharmacist

## 2023-01-16 LAB — POCT INR: INR: 2.5 (ref 2.0–3.0)

## 2023-01-22 DIAGNOSIS — R310 Gross hematuria: Secondary | ICD-10-CM | POA: Diagnosis not present

## 2023-01-22 DIAGNOSIS — R350 Frequency of micturition: Secondary | ICD-10-CM | POA: Diagnosis not present

## 2023-01-23 DIAGNOSIS — Z8673 Personal history of transient ischemic attack (TIA), and cerebral infarction without residual deficits: Secondary | ICD-10-CM | POA: Diagnosis not present

## 2023-01-23 DIAGNOSIS — F17210 Nicotine dependence, cigarettes, uncomplicated: Secondary | ICD-10-CM | POA: Diagnosis not present

## 2023-01-23 DIAGNOSIS — Z79899 Other long term (current) drug therapy: Secondary | ICD-10-CM | POA: Diagnosis not present

## 2023-01-24 ENCOUNTER — Other Ambulatory Visit (HOSPITAL_COMMUNITY): Payer: Self-pay

## 2023-01-24 ENCOUNTER — Encounter (HOSPITAL_COMMUNITY): Payer: Medicare Other

## 2023-01-24 ENCOUNTER — Ambulatory Visit (HOSPITAL_COMMUNITY): Payer: Self-pay | Admitting: Student-PharmD

## 2023-01-24 DIAGNOSIS — Z8673 Personal history of transient ischemic attack (TIA), and cerebral infarction without residual deficits: Secondary | ICD-10-CM | POA: Diagnosis not present

## 2023-01-24 DIAGNOSIS — Z79899 Other long term (current) drug therapy: Secondary | ICD-10-CM | POA: Diagnosis not present

## 2023-01-24 DIAGNOSIS — Z7901 Long term (current) use of anticoagulants: Secondary | ICD-10-CM

## 2023-01-24 DIAGNOSIS — Z95811 Presence of heart assist device: Secondary | ICD-10-CM

## 2023-01-24 DIAGNOSIS — F17210 Nicotine dependence, cigarettes, uncomplicated: Secondary | ICD-10-CM | POA: Diagnosis not present

## 2023-01-24 LAB — POCT INR: INR: 2.3 (ref 2.0–3.0)

## 2023-01-26 ENCOUNTER — Encounter (HOSPITAL_COMMUNITY): Payer: Self-pay | Admitting: Internal Medicine

## 2023-01-26 ENCOUNTER — Ambulatory Visit (HOSPITAL_COMMUNITY)
Admission: RE | Admit: 2023-01-26 | Discharge: 2023-01-26 | Disposition: A | Discharge status: Home / Self Care | Payer: Medicare Other | Source: Ambulatory Visit | Attending: Internal Medicine | Admitting: Internal Medicine

## 2023-01-26 VITALS — BP 102/0 | HR 81 | Wt 181.2 lb

## 2023-01-26 DIAGNOSIS — I2510 Atherosclerotic heart disease of native coronary artery without angina pectoris: Secondary | ICD-10-CM | POA: Diagnosis not present

## 2023-01-26 DIAGNOSIS — I480 Paroxysmal atrial fibrillation: Secondary | ICD-10-CM | POA: Diagnosis not present

## 2023-01-26 DIAGNOSIS — K219 Gastro-esophageal reflux disease without esophagitis: Secondary | ICD-10-CM | POA: Insufficient documentation

## 2023-01-26 DIAGNOSIS — Z95811 Presence of heart assist device: Secondary | ICD-10-CM | POA: Insufficient documentation

## 2023-01-26 DIAGNOSIS — I3139 Other pericardial effusion (noninflammatory): Secondary | ICD-10-CM | POA: Insufficient documentation

## 2023-01-26 DIAGNOSIS — R319 Hematuria, unspecified: Secondary | ICD-10-CM | POA: Diagnosis not present

## 2023-01-26 DIAGNOSIS — I110 Hypertensive heart disease with heart failure: Secondary | ICD-10-CM | POA: Diagnosis not present

## 2023-01-26 DIAGNOSIS — Z7901 Long term (current) use of anticoagulants: Secondary | ICD-10-CM | POA: Insufficient documentation

## 2023-01-26 DIAGNOSIS — Z8673 Personal history of transient ischemic attack (TIA), and cerebral infarction without residual deficits: Secondary | ICD-10-CM | POA: Diagnosis not present

## 2023-01-26 DIAGNOSIS — Z4801 Encounter for change or removal of surgical wound dressing: Secondary | ICD-10-CM | POA: Diagnosis not present

## 2023-01-26 DIAGNOSIS — Z79899 Other long term (current) drug therapy: Secondary | ICD-10-CM | POA: Diagnosis not present

## 2023-01-26 DIAGNOSIS — Z7982 Long term (current) use of aspirin: Secondary | ICD-10-CM | POA: Insufficient documentation

## 2023-01-26 DIAGNOSIS — Z95810 Presence of automatic (implantable) cardiac defibrillator: Secondary | ICD-10-CM | POA: Insufficient documentation

## 2023-01-26 DIAGNOSIS — B952 Enterococcus as the cause of diseases classified elsewhere: Secondary | ICD-10-CM | POA: Insufficient documentation

## 2023-01-26 DIAGNOSIS — J449 Chronic obstructive pulmonary disease, unspecified: Secondary | ICD-10-CM | POA: Diagnosis not present

## 2023-01-26 DIAGNOSIS — I5022 Chronic systolic (congestive) heart failure: Secondary | ICD-10-CM | POA: Insufficient documentation

## 2023-01-26 LAB — BASIC METABOLIC PANEL
Anion gap: 7 (ref 5–15)
BUN: 7 mg/dL (ref 6–20)
CO2: 27 mmol/L (ref 22–32)
Calcium: 8.9 mg/dL (ref 8.9–10.3)
Chloride: 109 mmol/L (ref 98–111)
Creatinine, Ser: 1.01 mg/dL (ref 0.61–1.24)
GFR, Estimated: >60 mL/min (ref >60)
Glucose, Bld: 99 mg/dL (ref 70–99)
Potassium: 4.2 mmol/L (ref 3.5–5.1)
Sodium: 143 mmol/L (ref 135–145)

## 2023-01-26 LAB — CBC
HCT: 41.7 % (ref 39.0–52.0)
Hemoglobin: 12.5 g/dL — ABNORMAL LOW (ref 13.0–17.0)
MCH: 24.2 pg — ABNORMAL LOW (ref 26.0–34.0)
MCHC: 30 g/dL (ref 30.0–36.0)
MCV: 80.7 fL (ref 80.0–100.0)
Platelets: 204 10*3/uL (ref 150–400)
RBC: 5.17 MIL/uL (ref 4.22–5.81)
RDW: 18.1 % — ABNORMAL HIGH (ref 11.5–15.5)
WBC: 5.3 10*3/uL (ref 4.0–10.5)
nRBC: 0 % (ref 0.0–0.2)

## 2023-01-26 LAB — LACTATE DEHYDROGENASE: LDH: 208 U/L — ABNORMAL HIGH (ref 98–192)

## 2023-01-26 LAB — PROTIME-INR
INR: 2 — ABNORMAL HIGH (ref 0.8–1.2)
Prothrombin Time: 22.7 s — ABNORMAL HIGH (ref 11.4–15.2)

## 2023-01-26 NOTE — Progress Notes (Signed)
 Pt presents for 2 month follow up in VAD Clinic today alone. Reports no problems with VAD equipment or concerns with drive line.   Pt ambulated into clinic independently without walker. States he is feeling a lot stronger. He still has limitation with his left arm but continues strengthening exercises. He reports now being able to change power sources independently without adaptive grips. He is continuing to live back independently without issues. Pt participating in cardiac rehab at [facility].  Denies lightheadedness, dizziness, falls, shortness of breath, and signs of bleeding. He is taking all medication as prescribed. States he has been tired a lot. Encouraged proper sleep hygiene. He uses Trazadone PRN. Pt states on the nights he takes medication he sleeps well.  PET Scan done 01/24/23. Discussed with Dr.Bensimhon.  Blood cultures from 9/6 positive for Enterococcus Faecalis. Completed 6 weeks of IV Ampicillin  and Ceftriaxone  10/19. On chronic suppressive Amoxicillin  1000 mg BID.   Pt had recent painless hematuria. Referred to [facility] who recommended CT scan and Cystoscopy.   Vital Signs:  Doppler Pressure: 102 Automatic BP: 104/81(89) HR: 81 NSR w/ PVC's SPO2: 96% RA   Weight: 181.2 lb w/ eqt Discharge weight: 171.2 lb w/ eqt   VAD Indication: Destination Therapy - sarcoid   LVAD assessment: HM III  Primary Controller: VAD Speed: 5400 rpms Flow: 4.8 Power: 4.1 w    PI: 3.0 Alarms: none Events: 10-20 daily  Fixed speed: 5400 Low speed limit: 5100  Primary controller: back up battery due for replacement in 26 months Secondary controller: back up battery due for replacement in 26 months   I reviewed the LVAD parameters from today and compared the results to the patient's prior recorded data. LVAD interrogation was NEGATIVE for significant power changes, NEGATIVE for clinical alarms and STABLE for PI events/speed drops. No programming changes were made and pump  is functioning within specified parameters. Pt is performing daily controller and system monitor self tests along with completing weekly and monthly maintenance for LVAD equipment.   LVAD equipment check completed and is in good working order. Back-up equipment not present at today's visit.   Annual Equipment Maintenance on UBC/PM was performed on 08/29/22.  Exit Site Care:  Existing VAD dressing removed and site care performed using sterile technique by [facility]. Drive line exit site cleaned with betadine  x 2, allowed to dry, and rinsed with saline. Covered with gauze dressing. Exit site healed and incorporated, the velour is fully implanted at exit site. No redness, tenderness, drainage, foul odor or rash noted. Rash is much better today, but moving forward pt should clean with betadine  or vashe. Pt given betadine  swabs  today. VAD coordinator called Roberta and educated her on using betadine . Drive line anchor re-applied. Pt denies fever or chills. Pt has adequate dressing supplies for home use.   Significant Events on VAD Support:  08/30/22: Acute infarct seen on the right temporal cortex and basal ganglia    Device: Medtronic Therapies: ON Last check: 06/13/22   BP & Labs:  MAP 101 - Doppler is reflecting modified systolic   Hgb 12.5 - No S/S of bleeding. Specifically denies melena/BRBPR or nosebleeds.   LDH stable at 208 with established baseline of 180- 245. Denies tea-colored urine. No power elevations noted on interrogation.    Plan:  No medication changes today Follow up in VAD Clinic in 2 months Coumadin  dosing per [facility]  Schuyler Lunger RN,BSN VAD Coordinator  Office: [phone]  24/7 Pager: [phone]

## 2023-01-26 NOTE — Patient Instructions (Signed)
 No medication changes today Follow up in VAD Clinic 2 months Coumadin dosing per Leotis Shames [facility]

## 2023-01-26 NOTE — Progress Notes (Signed)
 LVAD CLINIC NOTE  PCP: Montey Lot, PA-C Cardiologist: Redell Leiter, MD HF MD:    HPI:  Charles Holmes is a 58 y.o. 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 Underwent HM- 3 LVAD implant on 08/13 + clipping left atrial appendage    Admitted to [facility] 11/21 with CP. Found to have VT and EF 25-30% mild AI, Charles and TR. Transferred to [facility]. Cath showed 70% prox LAD with no intervention.    S/P CRT-D upgrade 06/08/21.   6/24, admitted w/ acute CVA due to right M1 occlusion treated w/ mechanical thrombectomy.   Echo showed LV markedly dilated EF < 20% and small effusion. RV ok. CVA felt to be cardioembolic.   Admitted in 7/24 for low-output HF. Angiography showed mild nonobstructive CAD. RHC hemodynamics c/w severe NICM w/ severely elevated filling pressures and low output state c/w cardiogenic shock. Repeat PFTs w/ severe obstructive defect (FEV1 1.04L, FEV1/FVC 48%) +response to bronchodilator.   Underwent LVAD implant on 08/13 + clipping left atrial appendage d/t severe thickening and invagination of mitral valve annulus impeding flows. Apical core sent to pathology - no mention of sarcoid.   Post VAD implant c/b left-sided hemiplegia. CT head 8/14 with acute R MCA infarct. Taken to IR for thrombectomy. F/u head CT with small hemorrhagic conversion. Had some residual deficits w/ mild left sided weakness but otherwise recovered. Course further c/b enterococcus faecalis bacteremia w/ 2/2 + cultures on 9/6. ID consulted. Recommended ampicillin  and ceftriaxone . PICC removed. Repeat BC X 2 09/08 NGTD. Will need 6 wks total therapy. EOD 11/04/22.   Here for routine f/u. Recently seen for painless hematuria. Seen by urology. Recent PET scan no evidence of malignancy. Cytoscopy ok. No recurrence. Otherwise doing well. Able to do all ADLs without problem. Says he is fatigued at times. Denies orthopnea or PND. No fevers, chills or problems with  driveline. No melena or neuro symptoms. No VAD alarms. Taking all meds as prescribed.    VAD Indication: Destination Therapy - sarcoid   LVAD assessment: HM III   Primary Controller: VAD Speed: 5400 rpms Flow: 4.8 Power: 4.1 w    PI: 3.0 Alarms: none Events: 10-20 daily  Fixed speed: 5400 Low speed limit: 5100   Primary controller: back up battery due for replacement in 26 months Secondary controller: back up battery due for replacement in 26 months   I reviewed the LVAD parameters from today and compared the results to the patient's prior recorded data. LVAD interrogation was NEGATIVE for significant power changes, NEGATIVE for clinical alarms and STABLE for PI events/speed drops. No programming changes were made and pump is functioning within specified parameters. Pt is performing daily controller and system monitor self tests along with completing weekly and monthly maintenance for LVAD equipment.   LVAD equipment check completed and is in good working order. Back-up equipment not present at today's visit.   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 possible cardiac sarcoidosis.   Vaccine counseling 11/19/2022   Wide-complex tachycardia 2021   wears LifeVest    Current Outpatient Medications  Medication Sig Dispense Refill   acetaminophen  (TYLENOL ) 325 MG tablet Take 1-2 tablets (325-650 mg total) by mouth every 4  (four) hours as needed for mild pain.     amoxicillin  (AMOXIL ) 500 MG tablet Take 2 tablets (1,000 mg total) by mouth 2 (two) times daily. 360 tablet 3   aspirin  EC 81 MG tablet Take 1 tablet (81 mg total) by mouth daily. Swallow whole. 120 tablet 0   atorvastatin  (LIPITOR ) 80 MG tablet Take 1 tablet (80 mg total) by mouth daily. 30 tablet 0   dorzolamide -timolol  (COSOPT ) 22.3-6.8 MG/ML ophthalmic solution Place 1 drop into both eyes in the morning and at bedtime.     fluticasone-salmeterol (ADVAIR  HFA) 230-21 MCG/ACT inhaler Inhale 2 puffs into the lungs 2 (two) times daily. 1 each 12   latanoprost  (XALATAN ) 0.005 % ophthalmic solution Place 1 drop into both eyes at bedtime.     mexiletine (MEXITIL ) 250 MG capsule TAKE 1 CAPSULE BY MOUTH 2 TIMES DAILY. 180 capsule 3   Multiple Vitamin (MULTIVITAMIN WITH MINERALS) TABS tablet Take 1 tablet by mouth daily.     nystatin  cream (MYCOSTATIN ) Apply 1 Application topically 2 (two) times daily. 30 g 3   pantoprazole  (PROTONIX ) 40 MG tablet Take 1 tablet (40 mg total) by mouth daily before breakfast. 30 tablet 0   sertraline  (ZOLOFT ) 50 MG tablet TAKE 1 TABLET BY MOUTH EVERY DAY 90 tablet 3   sildenafil  (REVATIO ) 20 MG tablet Take 1 tablet (20 mg total) by mouth 3 (three) times daily. 90 tablet 11   spironolactone  (ALDACTONE ) 25 MG tablet Take 1 tablet (25 mg total) by mouth daily. 90 tablet 3   traZODone  (DESYREL ) 100 MG tablet Take 1 tablet (100 mg total) by mouth at bedtime. 90 tablet 3   warfarin (COUMADIN ) 3 MG tablet Take 6 mg (2 tabs) every Monday/Friday and 3 mg (1 tab) all other days or as directed by the advanced heart failure clinic 60 tablet 3   albuterol  (PROVENTIL ) (2.5 MG/3ML) 0.083% nebulizer solution Take 3 mLs (2.5 mg total) by nebulization every 4 (four) hours as needed for wheezing or shortness of breath. (Patient not taking: Reported on 01/26/2023) 75 mL 12   No current facility-administered medications for this encounter.    Pacerone   [amiodarone ] and Percocet [oxycodone -acetaminophen ]     Vital Signs:  Doppler Pressure: 102 Automatic BP: 104/81(89) HR: 81 NSR w/ PVC's SPO2: 96% RA   Weight: 181.2 lb w/ eqt Last weight: 171.2 lb w/ eqt   Physical Exam: General:  NAD.  HEENT: normal  Neck: supple. JVP not elevated.  Carotids 2+ bilat; no bruits. No lymphadenopathy or thryomegaly appreciated. Cor: LVAD hum.  Lungs: Clear. Abdomen: soft, nontender, non-distended. No hepatosplenomegaly. No bruits or masses. Good bowel sounds. Driveline site clean. Anchor in place.  Extremities: no cyanosis, clubbing, rash. Warm no edema  Neuro: alert & oriented x 3. Very mild LUE weakness    ASSESSMENT AND PLAN:   1.  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 Presented with VT. LHC 70% LAD  - cMRI 12/21 concerning for sarcoid and EF 18%.  - [facility] 2/[facility] EF 25% + active sarcoid - Echo 08/26/20 EF < 20% severely dilated LV RV mildly decreased.  - Medtronic CRT-D upgrade  in 06/08/21 - Echo 07/10/22: EF <20%, RV okay, mod pericardial effusion, mod Charles/TR - Nonobstructive CAD, severely elevated filling pressures and low Fick CO/CI (2.7/1.4) - 08/29/22 HM III LVAD implant + clipping LAA d/t severe thickening and invagination of mitral valve annulus impeding flows.  - Doing well with VAD support NYHA I-II.Doubt fatigue is prmarily cardiac.  - Volume status ok - Continue spiro 25 daily   2. HM-3 LVAD - VAD interrogated personally. Parameters stable. - LDH 208 - DL site ok - MAPs look good - Hgb 11.8 -> 12.5 - INR 2.5 recently. Goal 2.0-3.0. ASA 81 mg daily (on for CVA). Discussed warfarin dosing with PharmD personally.   3.  H/o stroke - Admitted 06/24 w/ R MCA stroke. S/p TPA and mechanical clot extraction. No residual deficits. Likely cardioembolic in setting of severe LV dysfunction. - Developed left sided weakness 08/14. CTA with R MCA infarct. Taken to IR for thrombectomy - Repeat CT head with small  to moderate size hemorrhagic conversion.  - Recovered well with just mild LUE weakness.No change today on exam  - Continue warfarin/ASA  4. Enterococcus faecalis bacteremia - Bcx 2/2 on 9/6 - ID consult 9/6 -> ampicillin  and ceftriaxone  - Echo 09/10 - no obvious vegetations - Completed 6 weeks of IV Ampicillin  and Ceftriaxone . Now on chronic suppression with Amoxicillin . - DL site looks good No infectious symptoms   5. Hx VT - ln setting of potential sarcoid heart disease  - Off amio due to tremor. Continue mexiletine  - now s/p ICD. - No VT on device   6. CAD - LHC 12/07/19 70-% LAD, no intervention - LHC 8/24 non obstructive CAD.  - Continue statin. On aspirin  for VAD. - No s/s angina   7. 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  - holding methotrexate  w/ recent surgery and active infection, can discuss timing of restarting the medication at outpatient follow-up - apical core pathology not diagnostic of cardiac sarcoidosis.  - PET negative for sarcoid, Will not restart methotrexate    8. 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 Pulmonary  - PFTs with severe obstructive defect, response to bronchodilator. FEV1 1.04L, FEV1/FVC 48% - stable  I spent a total of 40 minutes today: 1) reviewing the patient's medical records including previous charts, labs and recent notes from other providers; 2) examining the patient and counseling them on their medical issues/explaining the plan of care; 3) adjusting meds as needed and 4) ordering lab work or other needed tests.    Charles Fuel, MD  11:48 AM

## 2023-01-27 ENCOUNTER — Other Ambulatory Visit (HOSPITAL_COMMUNITY): Payer: Self-pay | Admitting: Internal Medicine

## 2023-01-31 ENCOUNTER — Ambulatory Visit (HOSPITAL_COMMUNITY): Payer: Self-pay | Admitting: Pharmacist

## 2023-01-31 LAB — POCT INR: INR: 1.6 — AB (ref 2.0–3.0)

## 2023-02-01 DIAGNOSIS — G4700 Insomnia, unspecified: Secondary | ICD-10-CM | POA: Diagnosis not present

## 2023-02-01 DIAGNOSIS — Z95811 Presence of heart assist device: Secondary | ICD-10-CM | POA: Diagnosis not present

## 2023-02-01 DIAGNOSIS — K219 Gastro-esophageal reflux disease without esophagitis: Secondary | ICD-10-CM | POA: Diagnosis not present

## 2023-02-01 DIAGNOSIS — Z23 Encounter for immunization: Secondary | ICD-10-CM | POA: Diagnosis not present

## 2023-02-01 DIAGNOSIS — D8685 Sarcoid myocarditis: Secondary | ICD-10-CM | POA: Diagnosis not present

## 2023-02-01 DIAGNOSIS — I5022 Chronic systolic (congestive) heart failure: Secondary | ICD-10-CM | POA: Diagnosis not present

## 2023-02-01 DIAGNOSIS — I69359 Hemiplegia and hemiparesis following cerebral infarction affecting unspecified side: Secondary | ICD-10-CM | POA: Diagnosis not present

## 2023-02-01 DIAGNOSIS — J449 Chronic obstructive pulmonary disease, unspecified: Secondary | ICD-10-CM | POA: Diagnosis not present

## 2023-02-07 ENCOUNTER — Other Ambulatory Visit (HOSPITAL_COMMUNITY): Payer: Self-pay

## 2023-02-08 ENCOUNTER — Ambulatory Visit (HOSPITAL_COMMUNITY): Payer: Self-pay | Admitting: Pharmacist

## 2023-02-08 LAB — POCT INR: INR: 2.8 (ref 2.0–3.0)

## 2023-02-14 ENCOUNTER — Ambulatory Visit (HOSPITAL_COMMUNITY): Payer: Self-pay | Admitting: Pharmacist

## 2023-02-14 ENCOUNTER — Telehealth: Payer: Self-pay

## 2023-02-14 DIAGNOSIS — Z0000 Encounter for general adult medical examination without abnormal findings: Secondary | ICD-10-CM

## 2023-02-14 LAB — POCT INR: INR: 3.4 — AB (ref 2.0–3.0)

## 2023-02-14 NOTE — Progress Notes (Signed)
LVAD INR

## 2023-02-14 NOTE — Telephone Encounter (Signed)
Called patient to follow up on his interest in participating in health coaching after LVAD support group on 02/13/23. Patient requested that he be scheduled for the first of next week around noon. Patient has been scheduled for his initial health coaching session on 2/3 at 12:30pm over the phone.   Renaee Munda, MS, ERHD, [facility]  Care Guide, Health & Wellness Coach [address] [place] [address] Telephone: [phone] Email: [email or web address]

## 2023-02-15 ENCOUNTER — Telehealth (HOSPITAL_COMMUNITY): Payer: Self-pay | Admitting: *Deleted

## 2023-02-15 ENCOUNTER — Telehealth (HOSPITAL_COMMUNITY): Payer: Self-pay | Admitting: Pharmacy Technician

## 2023-02-15 DIAGNOSIS — Z95811 Presence of heart assist device: Secondary | ICD-10-CM

## 2023-02-15 DIAGNOSIS — R319 Hematuria, unspecified: Secondary | ICD-10-CM

## 2023-02-15 DIAGNOSIS — Z7901 Long term (current) use of anticoagulants: Secondary | ICD-10-CM

## 2023-02-15 NOTE — Telephone Encounter (Signed)
Advanced Heart Failure Patient Advocate Encounter  Received PA request for Sildenafil. The patient does not qualify to get the medication through PA approval. Will need to pay cash or use goodrx card. Sent pharmacy message as well.  Archer Asa, CPhT

## 2023-02-15 NOTE — Telephone Encounter (Addendum)
Received call from patient reporting increased blood in his urine. He has been following with urology, and has a speciality urology CT scan tomorrow morning. INR was 3.4 this week on home machine. Denies feeling poorly at this time, but is concerned about blood in his urine. Will plan to check INR/CBC/anemia panel tomorrow following his CT scan.   Lauren PharmD updated on the above. Will plan to HOLD Coumadin this evening.   Pt updated on plan and verbalized understanding.   [name] RN VAD Coordinator  Office: [phone]  24/7 Pager: [phone]

## 2023-02-16 ENCOUNTER — Other Ambulatory Visit (HOSPITAL_COMMUNITY): Payer: Self-pay | Admitting: Unknown Physician Specialty

## 2023-02-16 ENCOUNTER — Ambulatory Visit (HOSPITAL_COMMUNITY): Payer: Self-pay | Admitting: Pharmacist

## 2023-02-16 ENCOUNTER — Ambulatory Visit (HOSPITAL_COMMUNITY)
Admission: RE | Admit: 2023-02-16 | Discharge: 2023-02-16 | Disposition: A | Discharge status: Home / Self Care | Payer: Medicare Other | Source: Ambulatory Visit | Attending: Cardiology

## 2023-02-16 DIAGNOSIS — R310 Gross hematuria: Secondary | ICD-10-CM | POA: Diagnosis not present

## 2023-02-16 DIAGNOSIS — Z95811 Presence of heart assist device: Secondary | ICD-10-CM | POA: Insufficient documentation

## 2023-02-16 DIAGNOSIS — R319 Hematuria, unspecified: Secondary | ICD-10-CM | POA: Diagnosis not present

## 2023-02-16 DIAGNOSIS — Z7901 Long term (current) use of anticoagulants: Secondary | ICD-10-CM | POA: Diagnosis not present

## 2023-02-16 DIAGNOSIS — D509 Iron deficiency anemia, unspecified: Secondary | ICD-10-CM

## 2023-02-16 LAB — CBC
HCT: 41.7 % (ref 39.0–52.0)
Hemoglobin: 12.8 g/dL — ABNORMAL LOW (ref 13.0–17.0)
MCH: 24.1 pg — ABNORMAL LOW (ref 26.0–34.0)
MCHC: 30.7 g/dL (ref 30.0–36.0)
MCV: 78.5 fL — ABNORMAL LOW (ref 80.0–100.0)
Platelets: 217 10*3/uL (ref 150–400)
RBC: 5.31 MIL/uL (ref 4.22–5.81)
RDW: 19.1 % — ABNORMAL HIGH (ref 11.5–15.5)
WBC: 8.5 10*3/uL (ref 4.0–10.5)
nRBC: 0 % (ref 0.0–0.2)

## 2023-02-16 LAB — FERRITIN: Ferritin: 18 ng/mL — ABNORMAL LOW (ref 24–336)

## 2023-02-16 LAB — IRON AND TIBC
Iron: 66 ug/dL (ref 45–182)
Saturation Ratios: 15 % — ABNORMAL LOW (ref 17.9–39.5)
TIBC: 455 ug/dL — ABNORMAL HIGH (ref 250–450)
UIBC: 389 ug/dL

## 2023-02-16 LAB — PROTIME-INR
INR: 2.9 — ABNORMAL HIGH (ref 0.8–1.2)
Prothrombin Time: 30.5 s — ABNORMAL HIGH (ref 11.4–15.2)

## 2023-02-16 LAB — VITAMIN B12: Vitamin B-12: 670 pg/mL (ref 180–914)

## 2023-02-16 LAB — FOLATE: Folate: 27.1 ng/mL (ref >5.9)

## 2023-02-17 ENCOUNTER — Other Ambulatory Visit (HOSPITAL_COMMUNITY): Payer: Self-pay | Admitting: Internal Medicine

## 2023-02-19 ENCOUNTER — Ambulatory Visit: Payer: Medicare Other | Attending: Cardiovascular Disease

## 2023-02-19 DIAGNOSIS — Z0000 Encounter for general adult medical examination without abnormal findings: Secondary | ICD-10-CM

## 2023-02-19 NOTE — Progress Notes (Signed)
 HEALTH & WELLNESS COACHING INITIAL INTAKE   Appointment Outcome: Completed, Session #: Initial                        Start time: 12:31pm   End time: 1:14pm   Total Mins: 43 minutes    What are the Patient's goals from Coaching? Patient wants to incorporated exercises that will help him build muscle mass and lose weight to improve his endurance. Patient wants to reduce the consumption of junk food/sweets while watching tv. Patient wants to improve his iron levels to increase his energy.   Why did they seek coaching now? Patient stated that he lacks motivation to engage in additional exercise after his daily walk and would like to brainstorm a plan to get home moving towards being more active. Patient shared that his challenge is eating when he is bored and not preparing his own food.   Readiness - What stage is the patient in regarding their goal(s)?   Patient is in the preparation stage of increasing his exercise. Patient is in the contemplation stage of improving healthy eating habits.    Coaching Progress Notes: Patient walks daily for 15-20 minutes because he feels that he needs to do as much as he can but wants to increase his exercise. Patient crashes on couch after walking and is not motivated to exercise any more afterwards.  Patient has found exercises on the [facility] app he would like to try because he lost muscle mass while in the hospital.  Patient is concerned what exercises he can do because of his LVAD equipment.  Patient stated that his is tired and weak because is iron is low and will start getting iron infusions.   Patient eats sweets (donuts) and other junk food while watching tv because he is bored.   Patient eats egg and cheese sandwiches or egg and sausage for breakfast. Patient eats dinner with his daughter or mother that they have prepared.   Patient has not seen a nutritionist before.     Coaching Outcomes (Will determine what foods he wants to work on  incorporating in his diet at next appt)  AGREEMENTS SECTION   Overall Goal(s): Increase exercise to a minimum of 150 minutes per week to build lean muscle and increase endurance over the next 3 months. Improve healthy eating habits to increase iron labs to maximize energy levels by eating food sources that are rich in iron  Improve healthy eating habits to reduce consumption of junk food while watching tv by eating healthy snacks.                                               Agreement/Action Steps:  Increase exercise to a minimum of 150 minutes per week Continue to walk 15-20 minutes daily Use [employer] app to determine which strength building exercises can be performed safely with LVAD 2-3 days/week for 15-20 minutes.  Improve healthy eating  Review iron-rich foods handouts to determine what foods can be included/increased in overall diet Review Snack Time handout for healthy snack ideas that can be consumed to reduce consumption of junk food     Patient review of Health Coaching Agreement Reviewed Coaching Agreement and Code of Ethics with Patient during initial session. Answered any questions the patient had if any regarding the Coaching Agreement and Code of Ethics.  Patient verbally agreed to adhere to the Coaching Agreement and to abide by the Code of Ethics.  Mailed patient with a hard/electronic copy of the Coaching Agreement and Code of Ethics.     Referrals: N/A  Resources: Mailed handouts for iron-rich foods and protein content in foods.

## 2023-02-20 ENCOUNTER — Encounter (HOSPITAL_COMMUNITY)
Admission: RE | Admit: 2023-02-20 | Discharge: 2023-02-20 | Disposition: A | Discharge status: Home / Self Care | Payer: Medicare Other | Source: Ambulatory Visit | Attending: Internal Medicine | Admitting: Internal Medicine

## 2023-02-20 ENCOUNTER — Other Ambulatory Visit (HOSPITAL_COMMUNITY): Payer: Self-pay | Admitting: *Deleted

## 2023-02-20 DIAGNOSIS — Z95811 Presence of heart assist device: Secondary | ICD-10-CM

## 2023-02-20 DIAGNOSIS — D509 Iron deficiency anemia, unspecified: Secondary | ICD-10-CM

## 2023-02-20 DIAGNOSIS — I5022 Chronic systolic (congestive) heart failure: Secondary | ICD-10-CM

## 2023-02-20 DIAGNOSIS — I50810 Right heart failure, unspecified: Secondary | ICD-10-CM

## 2023-02-20 MED ORDER — FERUMOXYTOL INJECTION 510 MG/17 ML
510.0000 mg | INTRAVENOUS | Status: DC
  Administered 2023-02-20: 510 mg via INTRAVENOUS
  Filled 2023-02-20: qty 510

## 2023-02-20 MED ORDER — SILDENAFIL CITRATE 20 MG PO TABS: 20.0000 mg | ORAL_TABLET | Freq: Three times a day (TID) | ORAL | 11 refills | Status: DC

## 2023-02-20 MED ORDER — FERUMOXYTOL INJECTION 510 MG/17 ML: 510.0000 mg | INTRAVENOUS | Status: DC

## 2023-02-20 NOTE — Progress Notes (Signed)
 Pt called regarding cost of Sildenafil  prescription. He has been out of medication for 3 days. Pt does not qualify for PA approval and will need to pay cash or use GoodRx card. Pt agreeable to picking up prescription at [facility] for $23.15/month with GoodRx card. Prescription sent to [facility] on [address] in [place] per pt request. Emailed pt a copy of GoodRx card, and included the below information in prescription.   Good Rx:  BIN [id number] PCN [id number] [facility] [id number] Member ID  [id number]   Isaiah Knoll RN VAD Coordinator  Office: [phone]  24/7 Pager: [phone]

## 2023-02-20 NOTE — Addendum Note (Signed)
Addended by: [name] B on: 02/20/2023 01:25 PM   Modules accepted: Orders

## 2023-02-21 ENCOUNTER — Ambulatory Visit (HOSPITAL_COMMUNITY): Payer: Self-pay | Admitting: Pharmacist

## 2023-02-21 LAB — POCT INR: INR: 2.4 (ref 2.0–3.0)

## 2023-02-26 ENCOUNTER — Ambulatory Visit: Payer: Medicare Other | Admitting: Infectious Disease

## 2023-02-26 ENCOUNTER — Other Ambulatory Visit: Payer: Self-pay

## 2023-02-26 VITALS — Wt 176.2 lb

## 2023-02-26 DIAGNOSIS — A498 Other bacterial infections of unspecified site: Secondary | ICD-10-CM

## 2023-02-26 DIAGNOSIS — B952 Enterococcus as the cause of diseases classified elsewhere: Secondary | ICD-10-CM

## 2023-02-26 DIAGNOSIS — T827XXA Infection and inflammatory reaction due to other cardiac and vascular devices, implants and grafts, initial encounter: Secondary | ICD-10-CM | POA: Diagnosis not present

## 2023-02-26 DIAGNOSIS — R319 Hematuria, unspecified: Secondary | ICD-10-CM

## 2023-02-26 DIAGNOSIS — Z8659 Personal history of other mental and behavioral disorders: Secondary | ICD-10-CM

## 2023-02-26 MED ORDER — AMOXICILLIN 500 MG PO TABS
1000.0000 mg | ORAL_TABLET | Freq: Two times a day (BID) | ORAL | 3 refills | Status: AC
Start: 2023-02-26 — End: ?

## 2023-02-26 NOTE — Progress Notes (Signed)
 Subjective:  Chief complaint follow-up for E faecalis bacteremia and LVAD infection   Patient ID: Charles Holmes, male    DOB: [birth year], 58 y.o.   MRN: [id number]  HPI  Discussed the use of AI scribe software for clinical note transcription with the patient, who gave verbal consent to proceed.  History of Present Illness   The patient, with a history of heart failure requiring LVAD placement, presents for follow-up. He had an early postoperative infection which was a  bloodstream infection with E faecalis. which required IV antibiotics. He is currently on chronic suppressive amoxicillin  1000mg  twice daily, which he tolerates well. He understands the importance of this therapy in preventing further infection.  The patient reports feeling down due to boredom and lack of purpose since he stopped working due to his heart problems, which started in 2021. He lives alone with a pet cat and has family support, including a daughter.  He also reports having a CT scan ordered by urology due to hematuria.        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 possible cardiac sarcoidosis.   Vaccine counseling 11/19/2022   Wide-complex tachycardia 2021   wears LifeVest    Past Surgical History:  Procedure Laterality Date   BIV UPGRADE N/A 06/07/2021   Procedure: BIV ICD UPGRADE;  Surgeon: Lei Pump, MD;  Location: [facility] INVASIVE CV LAB;  Service: Cardiovascular;  Laterality: N/A;   CLIPPING OF  ATRIAL APPENDAGE Left 08/29/2022   Procedure: CLIPPING OF ATRIAL APPENDAGE;  Surgeon: Shon Downing, MD;  Location: [facility] OR;  Service: Open Heart Surgery;  Laterality: Left;   IABP INSERTION N/A 08/25/2022   Procedure: IABP Insertion;  Surgeon: Mardell Shade, MD;  Location: [facility] INVASIVE CV LAB;  Service: Cardiovascular;  Laterality: N/A;   ICD IMPLANT N/A 02/20/2020   Procedure: ICD IMPLANT;  Surgeon: Lei Pump, MD;  Location: [facility] INVASIVE CV LAB;  Service: Cardiovascular;  Laterality: N/A;   INSERTION OF IMPLANTABLE LEFT VENTRICULAR ASSIST DEVICE N/A 08/29/2022   Procedure: INSERTION OF IMPLANTABLE LEFT VENTRICULAR ASSIST DEVICE;  Surgeon: Shon Downing, MD;  Location: [facility] OR;  Service: Open Heart Surgery;  Laterality: N/A;   IR CT HEAD LTD  07/10/2022   IR CT HEAD LTD  08/30/2022   IR PERCUTANEOUS ART THROMBECTOMY/INFUSION INTRACRANIAL INC DIAG ANGIO  07/10/2022   IR PERCUTANEOUS ART THROMBECTOMY/INFUSION INTRACRANIAL INC DIAG ANGIO  08/30/2022   IR US  GUIDE VASC ACCESS LEFT  08/30/2022   IR US  GUIDE VASC ACCESS RIGHT  07/10/2022   RADIOLOGY WITH ANESTHESIA N/A 07/10/2022   Procedure: RADIOLOGY WITH ANESTHESIA;  Surgeon: Radiologist, Medication, MD;  Location: [facility] OR;  Service: Radiology;  Laterality: N/A;   RADIOLOGY WITH ANESTHESIA N/A 08/30/2022   Procedure: IR WITH ANESTHESIA;  Surgeon: Luellen Sages, MD;  Location: [facility] OR;  Service: Radiology;  Laterality: N/A;   REMOVAL OF IMPELLA LEFT VENTRICULAR ASSIST DEVICE N/A 08/29/2022   Procedure: REMOVAL OF INTRA-AORTIC BALLON PUMP;  Surgeon: Shon Downing, MD;  Location: [facility] OR;  Service: Open Heart Surgery;  Laterality: N/A;   RIGHT HEART CATH N/A 07/14/2022   Procedure: RIGHT HEART CATH;  Surgeon: Mardell Shade, MD;  Location: [facility] INVASIVE CV LAB;  Service: Cardiovascular;  Laterality: N/A;   RIGHT HEART CATH N/A 08/25/2022   Procedure: RIGHT HEART CATH;  Surgeon: Mardell Shade, MD;  Location: [facility] INVASIVE CV LAB;  Service:  Cardiovascular;  Laterality: N/A;   RIGHT HEART CATH N/A 09/12/2022   Procedure: RIGHT HEART CATH;  Surgeon: Darlis Eisenmenger, MD;  Location: [facility] INVASIVE CV LAB;  Service: Cardiovascular;  Laterality: N/A;   RIGHT/LEFT HEART CATH AND CORONARY ANGIOGRAPHY N/A 12/16/2019   Procedure: RIGHT/LEFT HEART CATH AND CORONARY ANGIOGRAPHY;  Surgeon: Swaz[place], Peter M, MD;  Location: [facility] INVASIVE CV LAB;  Service: Cardiovascular;  Laterality: N/A;   RIGHT/LEFT HEART CATH AND CORONARY ANGIOGRAPHY N/A 08/10/2022   Procedure: RIGHT/LEFT HEART CATH AND CORONARY ANGIOGRAPHY;  Surgeon: Mardell Shade, MD;  Location: [facility] INVASIVE CV LAB;  Service: Cardiovascular;  Laterality: N/A;   TEE WITHOUT CARDIOVERSION N/A 08/29/2022   Procedure: TRANSESOPHAGEAL ECHOCARDIOGRAM;  Surgeon: Shon Downing, MD;  Location: [facility] OR;  Service: Open Heart Surgery;  Laterality: N/A;   TOOTH EXTRACTION N/A 08/22/2022   Procedure: DENTAL RESTORATION/EXTRACTIONS;  Surgeon: Ascencion Lava, DMD;  Location: [facility] OR;  Service: Oral Surgery;  Laterality: N/A;    Family History  Problem Relation Age of Onset   Hypertension Father    Heart disease Father    Heart disease Mother       Social History   Socioeconomic History   Marital status: Divorced    Spouse name: Not on file   Number of children: Not on file   Years of education: Not on file   Highest education level: Not on file  Occupational History   Not on file  Tobacco Use   Smoking status: Former    Types: Cigarettes   Smokeless tobacco: Never  Vaping Use   Vaping status: Unknown  Substance and Sexual Activity   Alcohol  use: Yes    Alcohol /week: 6.0 standard drinks of alcohol     Types: 6 Cans of beer per week   Drug use: Yes    Types: Marijuana    Comment: stopped using months ago d/t how it affected breathing   Sexual activity: Yes  Other Topics Concern   Not on file  Social History Narrative   Not on file   Social Drivers of Health   Financial Resource Strain:  Low Risk  (07/12/2022)   Overall Financial Resource Strain (CARDIA)    Difficulty of Paying Living Expenses: Not hard at all  Food Insecurity: No Food Insecurity (08/13/2022)   Hunger Vital Sign    Worried About Running Out of Food in the Last Year: Never true    Ran Out of Food in the Last Year: Never true  Transportation Needs: No Transportation Needs (08/13/2022)   PRAPARE - Administrator, Civil Service (Medical): No    Lack of Transportation (Non-Medical): No  Physical Activity: Not on file  Stress: Not on file  Social Connections: Not on file    Allergies  Allergen Reactions   Pacerone  [Amiodarone ] Other (See Comments)    Severe tremors   Percocet [Oxycodone -Acetaminophen ] Itching     Current Outpatient Medications:    acetaminophen  (TYLENOL ) 325 MG tablet, Take 1-2 tablets (325-650 mg total) by mouth every 4 (four) hours as needed for mild  pain., Disp: , Rfl:    amoxicillin  (AMOXIL ) 500 MG tablet, Take 2 tablets (1,000 mg total) by mouth 2 (two) times daily., Disp: 360 tablet, Rfl: 3   aspirin  EC 81 MG tablet, Take 1 tablet (81 mg total) by mouth daily. Swallow whole., Disp: 120 tablet, Rfl: 0   atorvastatin  (LIPITOR ) 80 MG tablet, Take 1 tablet (80 mg total) by mouth daily., Disp: 30 tablet, Rfl: 0   dorzolamide -timolol  (COSOPT ) 22.3-6.8 MG/ML ophthalmic solution, Place 1 drop into both eyes in the morning and at bedtime., Disp: , Rfl:    fluticasone-salmeterol (ADVAIR  HFA) 230-21 MCG/ACT inhaler, Inhale 2 puffs into the lungs 2 (two) times daily., Disp: 1 each, Rfl: 12   latanoprost  (XALATAN ) 0.005 % ophthalmic solution, Place 1 drop into both eyes at bedtime., Disp: , Rfl:    mexiletine (MEXITIL ) 250 MG capsule, TAKE 1 CAPSULE BY MOUTH 2 TIMES DAILY., Disp: 180 capsule, Rfl: 3   Multiple Vitamin (MULTIVITAMIN WITH MINERALS) TABS tablet, Take 1 tablet by mouth daily., Disp: , Rfl:    nystatin  cream (MYCOSTATIN ), Apply 1 Application topically 2 (two) times daily.,  Disp: 30 g, Rfl: 3   pantoprazole  (PROTONIX ) 40 MG tablet, Take 1 tablet (40 mg total) by mouth daily before breakfast., Disp: 30 tablet, Rfl: 0   sertraline  (ZOLOFT ) 50 MG tablet, TAKE 1 TABLET BY MOUTH EVERY DAY, Disp: 90 tablet, Rfl: 3   sildenafil  (REVATIO ) 20 MG tablet, Take 1 tablet (20 mg total) by mouth 3 (three) times daily., Disp: 90 tablet, Rfl: 11   spironolactone  (ALDACTONE ) 25 MG tablet, Take 1 tablet (25 mg total) by mouth daily., Disp: 90 tablet, Rfl: 3   traZODone  (DESYREL ) 100 MG tablet, Take 1 tablet (100 mg total) by mouth at bedtime., Disp: 90 tablet, Rfl: 3   warfarin (COUMADIN ) 3 MG tablet, Take 6 mg (2 tabs) every Monday/Wednesday/Friday and 3 mg (1 tab) all other days or as directed by the advanced heart failure clinic, Disp: 180 tablet, Rfl: 3   albuterol  (PROVENTIL ) (2.5 MG/3ML) 0.083% nebulizer solution, Take 3 mLs (2.5 mg total) by nebulization every 4 (four) hours as needed for wheezing or shortness of breath. (Patient not taking: Reported on 12/07/2022), Disp: 75 mL, Rfl: 12   Review of Systems  Constitutional:  Negative for activity change, appetite change, chills, diaphoresis, fatigue, fever and unexpected weight change.  HENT:  Negative for congestion, rhinorrhea, sinus pressure, sneezing, sore throat and trouble swallowing.   Eyes:  Negative for photophobia and visual disturbance.  Respiratory:  Negative for cough, chest tightness, shortness of breath, wheezing and stridor.   Cardiovascular:  Negative for chest pain, palpitations and leg swelling.  Gastrointestinal:  Negative for abdominal distention, abdominal pain, anal bleeding, blood in stool, constipation, diarrhea, nausea and vomiting.  Genitourinary:  Negative for difficulty urinating, dysuria, flank pain and hematuria.  Musculoskeletal:  Negative for arthralgias, back pain, gait problem, joint swelling and myalgias.  Skin:  Negative for color change, pallor, rash and wound.  Neurological:  Negative for  dizziness, tremors, weakness and light-headedness.  Hematological:  Negative for adenopathy. Does not bruise/bleed easily.  Psychiatric/Behavioral:  Positive for dysphoric mood. Negative for agitation, behavioral problems, confusion, decreased concentration and sleep disturbance.        Objective:   Physical Exam Constitutional:      Appearance: He is well-developed.  HENT:     Head: Normocephalic and atraumatic.  Eyes:     Conjunctiva/sclera: Conjunctivae normal.  Pulmonary:     Effort: Pulmonary  effort is normal. No respiratory distress.     Breath sounds: No wheezing.  Abdominal:     General: There is no distension.     Palpations: Abdomen is soft.  Musculoskeletal:        General: No tenderness. Normal range of motion.     Cervical back: Normal range of motion and neck supple.  Skin:    General: Skin is warm and dry.     Coloration: Skin is not pale.     Findings: No erythema or rash.  Neurological:     General: No focal deficit present.     Mental Status: He is alert and oriented to person, place, and time.  Psychiatric:        Mood and Affect: Mood normal.        Behavior: Behavior normal.        Thought Content: Thought content normal.        Judgment: Judgment normal.           Assessment & Plan:   Assessment and Plan    LVAD Infection Chronic suppressive therapy with Amoxicillin  1000mg  twice daily for previous bloodstream infection post LVAD placement. Tolerating well. -Continue Amoxicillin  1000mg  twice daily. -[facility] at [facility].  Hematuria Recent CT scan ordered by Urology for evaluation of hematuria. Results pending. Cystoscopy planned. -Attempt to access CT scan results. -Continue to follow up with Urology for further management.  Depression Reports feeling down and bored due to limited activities post LVAD placement. Has family support. -Encourage continued communication with family and engagement in activities as  tolerated.  Follow-up Stable on current management with regular monitoring by Cardiology and LVAD clinic. -Schedule follow-up appointment in 4 months or sooner if any issues arise.

## 2023-02-27 ENCOUNTER — Encounter (HOSPITAL_COMMUNITY)
Admission: RE | Admit: 2023-02-27 | Discharge: 2023-02-27 | Disposition: A | Discharge status: Home / Self Care | Payer: Medicare Other | Source: Ambulatory Visit | Attending: Internal Medicine | Admitting: Internal Medicine

## 2023-02-27 DIAGNOSIS — D509 Iron deficiency anemia, unspecified: Secondary | ICD-10-CM | POA: Diagnosis not present

## 2023-02-27 MED ORDER — SODIUM CHLORIDE 0.9 % IV SOLN
510.0000 mg | INTRAVENOUS | Status: DC
  Administered 2023-02-27: 510 mg via INTRAVENOUS
  Filled 2023-02-27: qty 510

## 2023-02-28 ENCOUNTER — Ambulatory Visit (HOSPITAL_COMMUNITY): Payer: Self-pay | Admitting: Pharmacist

## 2023-02-28 LAB — POCT INR: INR: 2.4 (ref 2.0–3.0)

## 2023-03-01 ENCOUNTER — Telehealth: Payer: Self-pay | Admitting: Medical

## 2023-03-01 DIAGNOSIS — Z9181 History of falling: Secondary | ICD-10-CM | POA: Diagnosis not present

## 2023-03-01 DIAGNOSIS — Z0000 Encounter for general adult medical examination without abnormal findings: Secondary | ICD-10-CM | POA: Diagnosis not present

## 2023-03-01 NOTE — Telephone Encounter (Signed)
Patient is requesting to change the nurse visit appt and would like a call back to discuss further.

## 2023-03-01 NOTE — Telephone Encounter (Signed)
Tried to return call to patient. Voicemail not set up

## 2023-03-02 ENCOUNTER — Telehealth (HOSPITAL_COMMUNITY): Payer: Self-pay | Admitting: Unknown Physician Specialty

## 2023-03-02 NOTE — Telephone Encounter (Signed)
Pt called VAD office and left VM stating he had a ?. Called pt back and received his VM. VAD coordinator left a msg stating we are happy to help pt if he could call back. Also, ask him to leave question on the VM if we do not answer.  Carlton Adam RN, BSN VAD Coordinator 24/7 Pager [phone]

## 2023-03-05 ENCOUNTER — Ambulatory Visit: Payer: Medicare Other | Attending: Cardiovascular Disease

## 2023-03-05 DIAGNOSIS — R310 Gross hematuria: Secondary | ICD-10-CM | POA: Diagnosis not present

## 2023-03-05 DIAGNOSIS — Z0000 Encounter for general adult medical examination without abnormal findings: Secondary | ICD-10-CM

## 2023-03-05 DIAGNOSIS — R350 Frequency of micturition: Secondary | ICD-10-CM | POA: Diagnosis not present

## 2023-03-05 NOTE — Progress Notes (Signed)
 Appointment Outcome: Completed, Session #: 1                        Start time: 12:32pm   End time: 1:02pm   Total Mins: 20 minutes  AGREEMENTS SECTION   Overall Goal(s): Increase exercise to a minimum of 150 minutes per week to build lean muscle and increase endurance over the next 3 months. Improve healthy eating habits to increase iron labs to maximize energy levels by eating food sources that are rich in iron  Improve healthy eating habits to reduce consumption of junk food while watching tv by eating healthy snacks.                                                 Agreement/Action Steps:  Increase exercise to a minimum of 150 minutes per week Continue to walk 15-20 minutes daily Use [employer] app to determine which strength building exercises can be performed safely with LVAD 2-3 days/week for 15-20 minutes.   Improve healthy eating  Review iron-rich foods handouts to determine what foods can be included/increased in overall diet Review Snack Time handout for healthy snack ideas that can be consumed to reduce consumption of junk food    Progress Notes:  Patient stated that he has included jumping jacks into his routine that he previewed on the [facility] app. Patient has performed 15-20 jumping jacks within 5-10 minutes. Patient was able to engage in this exercise 4 times over the past 2 weeks.  There were other exercises that he previewed he would like to try that he wants to get clearance to perform first because of the tubing to his equipment.   Patient will not be using the [facility] app because it is subscription based.  Patient has continued to walk daily for 15-20 minutes.  Patient stated that his 12lbs. dumbbells are too heavy for his left arm.  Patient has included oatmeal as an iron-rich food source for breakfast. Patient has begun reducing his daily consumption of junk food.  Patient was eating a few donuts for breakfast before making the change. Patient stated that he  made the donuts last for 4-5 days compared to 2 days.  Patient feels that his daughter eats healthier than he does, because she includes a variety of vegetables, so he eats dinner with her.  Patient also eats dinner with his mother, but she doesn't watch what she eats.  Patient shared that he is trying to get used to his new teeth which creates a challenge to eat certain foods.  Patient has incorporated protein shakes for breakfast. Patient has stopped eating when he is not hungry.   Indicators of Success and Accountability:  Patient has decreased his consumption of donuts and added jumping jacks to his exercise routine.  Readiness: Patient is in the action stage of increasing exercise and improving healthy eating habits.  Strengths and Supports: Patient is being supported by his daughter and mother. Patient is relying on   Challenges and Barriers: Patient stated that he wants to make sure that certain exercises are safe to perform with his tubing and that the cords get in the way of some exercises he wants to do. Patient stated that the [facility] app is subscription based to get the exercise routines he was interested in. Patient is having a challenge with eating  certain foods due to getting dentures.   Coaching Outcomes: Patient will continue to walk daily as planned for 15-20 minutes. Patient will continue to perform 15-20 jumping jacks into his routine 2-3 per week for 5-10 minutes. Patient is working towards performing jumping jacks daily over time.  Patient will reach out to the office to determine if it is safe for him to do abdominal exercises with the tubing and how to work with the cords to safely perform other exercises.  Patient is planning on incorporating golf as his physical activity in Spring '25.  Patient will be working with his dentist to get the best fitting for his dentures to improve his ability to eat certain solid foods.  Patient revised agreement/action steps are  outlined below that he will implement over the next two weeks.    Agreement/Action Steps:  Increase exercise to a minimum of 150 minutes per week Walk 15-20 minutes daily Perform 15-20 jumping jacks 2-3xs/week for 5-10 minutes Determine additional strength building exercises can be performed safely with LVAD by calling office.   Improve healthy eating  Review iron-rich foods handouts to determine what foods can be included/increased in overall diet Review Snack Time handout for healthy snack ideas that can be consumed to reduce consumption of junk food    Attempted: Fulfilled - Patient continued to walk 15-20 minutes daily. Patient previewed Suanne Marker app for exercise ideas, such as jumping jacks. Patient review handouts provided for ideas to help him improve his healthy eating behavior.   Not attempted: Dropped/Revised - Patient will not be able to use the [facility] app due to needing to pay a monthly subscription.     Referrals: N/A

## 2023-03-07 ENCOUNTER — Ambulatory Visit (HOSPITAL_COMMUNITY): Payer: Self-pay | Admitting: Pharmacist

## 2023-03-07 LAB — POCT INR: INR: 2.8 (ref 2.0–3.0)

## 2023-03-13 NOTE — Telephone Encounter (Signed)
 Pt has a nurse visit scheduled for 03/19/23.  Will close this encounter and await a return call with any further needs.

## 2023-03-14 ENCOUNTER — Ambulatory Visit (HOSPITAL_COMMUNITY): Payer: Self-pay | Admitting: Pharmacist

## 2023-03-14 ENCOUNTER — Telehealth (HOSPITAL_COMMUNITY): Payer: Self-pay | Admitting: *Deleted

## 2023-03-14 ENCOUNTER — Ambulatory Visit (INDEPENDENT_AMBULATORY_CARE_PROVIDER_SITE_OTHER): Payer: Medicare Other

## 2023-03-14 DIAGNOSIS — I442 Atrioventricular block, complete: Secondary | ICD-10-CM

## 2023-03-14 LAB — CUP PACEART REMOTE DEVICE CHECK
Battery Remaining Longevity: 73 mo
Battery Voltage: 2.99 V
Brady Statistic AP VP Percent: 1.42 %
Brady Statistic AP VS Percent: 0.06 %
Brady Statistic AS VP Percent: 92.32 %
Brady Statistic AS VS Percent: 6.19 %
Brady Statistic RA Percent Paced: 1.46 %
Brady Statistic RV Percent Paced: 89.97 %
Date Time Interrogation Session: 20250226001804
HighPow Impedance: 71 Ohm
Implantable Lead Connection Status: 753985
Implantable Lead Connection Status: 753985
Implantable Lead Connection Status: 753985
Implantable Lead Implant Date: 20220204
Implantable Lead Implant Date: 20220204
Implantable Lead Implant Date: 20230523
Implantable Lead Location: 753858
Implantable Lead Location: 753859
Implantable Lead Location: 753860
Implantable Lead Model: 4598
Implantable Lead Model: 5076
Implantable Pulse Generator Implant Date: 20230523
Lead Channel Impedance Value: 189.073
Lead Channel Impedance Value: 198.205
Lead Channel Impedance Value: 198.205
Lead Channel Impedance Value: 241.412
Lead Channel Impedance Value: 241.412
Lead Channel Impedance Value: 266 Ohm
Lead Channel Impedance Value: 323 Ohm
Lead Channel Impedance Value: 323 Ohm
Lead Channel Impedance Value: 380 Ohm
Lead Channel Impedance Value: 456 Ohm
Lead Channel Impedance Value: 513 Ohm
Lead Channel Impedance Value: 513 Ohm
Lead Channel Impedance Value: 646 Ohm
Lead Channel Impedance Value: 722 Ohm
Lead Channel Impedance Value: 760 Ohm
Lead Channel Impedance Value: 779 Ohm
Lead Channel Impedance Value: 836 Ohm
Lead Channel Impedance Value: 855 Ohm
Lead Channel Pacing Threshold Amplitude: 0.5 V
Lead Channel Pacing Threshold Amplitude: 0.875 V
Lead Channel Pacing Threshold Amplitude: 1 V
Lead Channel Pacing Threshold Pulse Width: 0.4 ms
Lead Channel Pacing Threshold Pulse Width: 0.4 ms
Lead Channel Pacing Threshold Pulse Width: 0.8 ms
Lead Channel Sensing Intrinsic Amplitude: 0.5 mV
Lead Channel Sensing Intrinsic Amplitude: 0.5 mV
Lead Channel Sensing Intrinsic Amplitude: 1.125 mV
Lead Channel Sensing Intrinsic Amplitude: 1.125 mV
Lead Channel Setting Pacing Amplitude: 1.5 V
Lead Channel Setting Pacing Amplitude: 2 V
Lead Channel Setting Pacing Amplitude: 2 V
Lead Channel Setting Pacing Pulse Width: 0.4 ms
Lead Channel Setting Pacing Pulse Width: 0.8 ms
Lead Channel Setting Sensing Sensitivity: 0.3 mV

## 2023-03-14 LAB — POCT INR: INR: 2.4 (ref 2.0–3.0)

## 2023-03-14 NOTE — Telephone Encounter (Signed)
 Pt called VAD clinic reporting redness at exit site. Denies drainage, rash, fever, and chills. When asked if area is tender- "not really." Advised pt needs to come to VAD clinic for evaluation. Plan to see pt in VAD clinic Thursday 2/27 at 10:30 AM. He verbalized understanding.   Pt send below photo to VAD coordinator.     [name] RN VAD Coordinator  Office: [phone]  24/7 Pager: [phone]

## 2023-03-15 ENCOUNTER — Ambulatory Visit (HOSPITAL_COMMUNITY)
Admission: RE | Admit: 2023-03-15 | Discharge: 2023-03-15 | Disposition: A | Discharge status: Home / Self Care | Payer: Medicare Other | Source: Ambulatory Visit | Attending: Cardiology | Admitting: Cardiology

## 2023-03-15 DIAGNOSIS — Z95811 Presence of heart assist device: Secondary | ICD-10-CM | POA: Insufficient documentation

## 2023-03-15 DIAGNOSIS — Z7901 Long term (current) use of anticoagulants: Secondary | ICD-10-CM | POA: Diagnosis not present

## 2023-03-15 NOTE — Progress Notes (Signed)
 Pt presents to clinic today for driveline check. He called the office yesterday complaining that his driveline is red.  Existing VAD dressing removed and site care performed using sterile technique. Drive line exit site cleaned with Vashe x 2, allowed to dry. Silverlon dressing placed and Covered with [place] dressing. Exit site healed and incorporated, the velour is fully implanted at exit site. No redness, tenderness, drainage, foul odor or rash noted. Pt may shower. He was educated that if dressing becomes wet it must be changed. Drive line anchor re-applied. Pt denies fever or chills. Pt has adequate dressing supplies for home use.    Plan: You May shower. Return to clinic next week for a full visit with Dr Gala Romney  Carlton Adam RN, BSN VAD Coordinator 24/7 Pager [phone]

## 2023-03-19 ENCOUNTER — Telehealth: Payer: Self-pay

## 2023-03-19 ENCOUNTER — Ambulatory Visit: Payer: Medicare Other | Attending: Cardiology

## 2023-03-19 DIAGNOSIS — Z0000 Encounter for general adult medical examination without abnormal findings: Secondary | ICD-10-CM

## 2023-03-19 NOTE — Telephone Encounter (Signed)
 Called patient to hold telephonic health coaching appointment over the phone as scheduled. Patient did not answer. Left message for patient to return call to hold appointment today or to reschedule if necessary.   Renaee Munda, MS, ERHD, [facility]  Care Guide, Health & Wellness Coach [address] [place] [address] Telephone: [phone] Email: [email or web address]

## 2023-03-21 ENCOUNTER — Telehealth: Payer: Self-pay

## 2023-03-21 ENCOUNTER — Other Ambulatory Visit (HOSPITAL_COMMUNITY): Payer: Self-pay

## 2023-03-21 ENCOUNTER — Ambulatory Visit (HOSPITAL_COMMUNITY): Payer: Self-pay | Admitting: Pharmacist

## 2023-03-21 DIAGNOSIS — Z7901 Long term (current) use of anticoagulants: Secondary | ICD-10-CM | POA: Diagnosis not present

## 2023-03-21 DIAGNOSIS — Z0000 Encounter for general adult medical examination without abnormal findings: Secondary | ICD-10-CM

## 2023-03-21 DIAGNOSIS — Z95811 Presence of heart assist device: Secondary | ICD-10-CM

## 2023-03-21 LAB — POCT INR: INR: 2.1 (ref 2.0–3.0)

## 2023-03-21 NOTE — Telephone Encounter (Signed)
 Called patient to reschedule health coaching appointment per email patient sent. Patient did not answer. Left message with availability for patient to return call to schedule.  Renaee Munda, MS, ERHD, [facility]  Care Guide, Health & Wellness Coach [address], [address] [place] [address] Telephone: [phone] Email: [email or web address]

## 2023-03-21 NOTE — Telephone Encounter (Signed)
 Patient called in to reschedule appointment for next week. Patient has been reschedule for his health coaching appointment on 3/12 at 12:30pm and will be call at that time.   Renaee Munda, MS, ERHD, [facility]  Care Guide, Health & Wellness Coach [address] [place] [address] Telephone: [phone] Email: [email or web address]

## 2023-03-21 NOTE — Telephone Encounter (Signed)
 error

## 2023-03-23 ENCOUNTER — Ambulatory Visit (HOSPITAL_COMMUNITY)
Admission: RE | Admit: 2023-03-23 | Discharge: 2023-03-23 | Disposition: A | Discharge status: Home / Self Care | Payer: Medicare Other | Source: Ambulatory Visit | Attending: Internal Medicine | Admitting: Internal Medicine

## 2023-03-23 VITALS — BP 101/85 | HR 75 | Ht 68.0 in | Wt 174.0 lb

## 2023-03-23 DIAGNOSIS — I110 Hypertensive heart disease with heart failure: Secondary | ICD-10-CM | POA: Insufficient documentation

## 2023-03-23 DIAGNOSIS — Z7982 Long term (current) use of aspirin: Secondary | ICD-10-CM | POA: Insufficient documentation

## 2023-03-23 DIAGNOSIS — Z7901 Long term (current) use of anticoagulants: Secondary | ICD-10-CM | POA: Insufficient documentation

## 2023-03-23 DIAGNOSIS — I428 Other cardiomyopathies: Secondary | ICD-10-CM | POA: Insufficient documentation

## 2023-03-23 DIAGNOSIS — Z8673 Personal history of transient ischemic attack (TIA), and cerebral infarction without residual deficits: Secondary | ICD-10-CM | POA: Diagnosis not present

## 2023-03-23 DIAGNOSIS — I480 Paroxysmal atrial fibrillation: Secondary | ICD-10-CM | POA: Diagnosis not present

## 2023-03-23 DIAGNOSIS — G4700 Insomnia, unspecified: Secondary | ICD-10-CM | POA: Diagnosis not present

## 2023-03-23 DIAGNOSIS — I2510 Atherosclerotic heart disease of native coronary artery without angina pectoris: Secondary | ICD-10-CM | POA: Insufficient documentation

## 2023-03-23 DIAGNOSIS — Z95810 Presence of automatic (implantable) cardiac defibrillator: Secondary | ICD-10-CM | POA: Insufficient documentation

## 2023-03-23 DIAGNOSIS — Z8619 Personal history of other infectious and parasitic diseases: Secondary | ICD-10-CM | POA: Insufficient documentation

## 2023-03-23 DIAGNOSIS — Z95811 Presence of heart assist device: Secondary | ICD-10-CM | POA: Insufficient documentation

## 2023-03-23 DIAGNOSIS — I5022 Chronic systolic (congestive) heart failure: Secondary | ICD-10-CM | POA: Diagnosis not present

## 2023-03-23 LAB — MAGNESIUM: Magnesium: 2.3 mg/dL (ref 1.7–2.4)

## 2023-03-23 LAB — COMPREHENSIVE METABOLIC PANEL
ALT: 32 U/L (ref 0–44)
AST: 37 U/L (ref 15–41)
Albumin: 4.3 g/dL (ref 3.5–5.0)
Alkaline Phosphatase: 86 U/L (ref 38–126)
Anion gap: 10 (ref 5–15)
BUN: 15 mg/dL (ref 6–20)
CO2: 27 mmol/L (ref 22–32)
Calcium: 9.5 mg/dL (ref 8.9–10.3)
Chloride: 102 mmol/L (ref 98–111)
Creatinine, Ser: 1.08 mg/dL (ref 0.61–1.24)
GFR, Estimated: >60 mL/min (ref >60)
Glucose, Bld: 93 mg/dL (ref 70–99)
Potassium: 4.5 mmol/L (ref 3.5–5.1)
Sodium: 139 mmol/L (ref 135–145)
Total Bilirubin: 2.1 mg/dL — ABNORMAL HIGH (ref 0.0–1.2)
Total Protein: 7 g/dL (ref 6.5–8.1)

## 2023-03-23 LAB — CBC
HCT: 50 % (ref 39.0–52.0)
Hemoglobin: 15.9 g/dL (ref 13.0–17.0)
MCH: 27.4 pg (ref 26.0–34.0)
MCHC: 31.8 g/dL (ref 30.0–36.0)
MCV: 86.2 fL (ref 80.0–100.0)
Platelets: 186 10*3/uL (ref 150–400)
RBC: 5.8 MIL/uL (ref 4.22–5.81)
RDW: 22.9 % — ABNORMAL HIGH (ref 11.5–15.5)
WBC: 7.4 10*3/uL (ref 4.0–10.5)
nRBC: 0 % (ref 0.0–0.2)

## 2023-03-23 LAB — PROTIME-INR
INR: 2.4 — ABNORMAL HIGH (ref 0.8–1.2)
Prothrombin Time: 26.1 s — ABNORMAL HIGH (ref 11.4–15.2)

## 2023-03-23 LAB — LACTATE DEHYDROGENASE: LDH: 212 U/L — ABNORMAL HIGH (ref 98–192)

## 2023-03-23 LAB — PREALBUMIN: Prealbumin: 27 mg/dL (ref 18–38)

## 2023-03-23 NOTE — Patient Instructions (Addendum)
 No changes in medications. May take over the counter Melatonin 5 - 10 mg nightly for sleep. Drink 2 liters/day. Return to VAD Clinic in 2 months - see below. Please call us if any questions or concerns prior to your next visit.

## 2023-03-23 NOTE — Progress Notes (Signed)
 Pt presents for 2 month follow up in VAD Clinic today alone. Reports no problems with VAD equipment or concerns with drive line.   Pt reports he accidentally took a furosemide with a large amount of urine output. PI's increased today, pt reports occasional orthostatic hypotension and headache for 3 nights. He is drinking less than 2 liters per day. Reviewed with Dr. Gala Romney, asked patient to increase fluid intake to 2 liters/day and no further lasix.  Pt reports he feels "tired and sleepy" because he "can't sleep". He had been on Tramadol 100 mg hs and says it "didn't help". Reports his PCP increased dose to 150 mg hs, but he has not picked up Rx. Says he doesn' "want to get hooked" on sleeping medication. Discussed with Dr. Gala Romney. Pt may try melatonin 5 - 10 mg at hs.  Patient remains on chronic suppressive Amoxicillin 1000 mg BID.   Pt had recent painless hematuria. States he is waiting for call from [facility] to schedule diagnosic ureteroscopy and biopsy.    Vital Signs:  Doppler Pressure: 100 Automatic BP: 101/85 (92) HR:  75 SPO2: 91% RA   Weight: 174 lb w/ eqt Discharge weight: 181.2 lb w/ eqt   VAD Indication: Destination Therapy - sarcoid   LVAD assessment: HM III  Primary Controller: VAD Speed: 5400 rpms Flow: 4.0 Power: 3.9 w    PI: 4.8 Alarms: none Events: > 100 PI events today  Fixed speed: 5400 Low speed limit: 5100  Primary controller: back up battery due for replacement in 24 months Secondary controller: back up battery due for replacement in 24 months   I reviewed the LVAD parameters from today and compared the results to the patient's prior recorded data. LVAD interrogation was NEGATIVE for significant power changes, NEGATIVE for clinical alarms and STABLE for PI events/speed drops. No programming changes were made and pump is functioning within specified parameters. Pt is performing daily controller and system monitor self tests along with  completing weekly and monthly maintenance for LVAD equipment.   LVAD equipment check completed and is in good working order. Back-up equipment not present at today's visit.   Annual Equipment Maintenance on UBC/PM was performed on 08/29/22.  Exit Site Care:  Existing VAD dressing removed and site care performed using sterile technique. Drive line exit site cleaned with Vashe x 2, allowed to dry, and Sorbaview dressing with Silverlon patch applied. Exit site healed and incorporated, the velour is fully implanted at exit site. No redness, tenderness, drainage, foul odor or rash noted. Drive line anchor re-applied. Pt denies fever or chills. Provided patient with 8 dressing kits, box of large tegaderm, and Medline anchors (pt states allergy to Centurion foley anchor).     Significant Events on VAD Support:  08/30/22: Acute infarct seen on the right temporal cortex and basal ganglia    Device: Medtronic Therapies: ON Last check: 06/13/22   BP & Labs:  MAP 100 - Doppler is reflecting modified systolic   Hgb 12.8 - No S/S of bleeding. Specifically denies melena/BRBPR or nosebleeds.   LDH pending with established baseline of 180- 245. Denies tea-colored urine. No power elevations noted on interrogation.   6 month Intermacs follow up completed including:  Quality of Life, KCCQ-12, and Neurocognitive trail making.   Pt completed 1340 feet during 6 minute walk.  Patient Goals: Pt would like to find part time work doing "something". He cannot return to laying hardwood floors. He would also like to "lose his belly fat". Discussed  safe exercise options with Dr. Leory Plowman.   [place] Cardiomyopathy Questionnaire     03/23/2023    2:35 PM 12/11/2022   12:17 PM  KCCQ-12  1 a. Ability to shower/bathe Not at all limited Not at all limited  1 b. Ability to walk 1 block Not at all limited Not at all limited  1 c. Ability to hurry/jog Quite a bit limited Quite a bit limited  2. Edema feet/ankles/legs  Less than once a week Less than once a week  3. Limited by fatigue 1-2 times a week 1-2 times a week  4. Limited by dyspnea Less than once a week Less than once a week  5. Sitting up / on 3+ pillows Never over the past 2 weeks Never over the past 2 weeks  6. Limited enjoyment of life Slightly limited Slightly limited  7. Rest of life w/ symptoms Mostly dissatisfied Mostly dissatisfied  8 a. Participation in hobbies Slightly limited Slightly limited  8 b. Participation in chores Slightly limited Slightly limited  8 c. Visiting family/friends N/A, did not do for other reasons N/A, did not do for other reasons      Plan:  No changes in medications. May take over the counter Melatonin 5 - 10 mg nightly for sleep. Drink 2 liters/day. Return to VAD Clinic in 2 months - see below. Please call us if any questions or concerns prior to your next visit.   Hessie Diener RN,BSN VAD Coordinator  Office: [phone]  24/7 Pager: [phone]

## 2023-03-25 NOTE — Progress Notes (Addendum)
 LVAD CLINIC NOTE  PCP: [name], PA-C Cardiologist: Norman Herrlich, MD HF MD:    HPI:  Charles Holmes is a 58 y.o. 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 Underwent HM- 3 LVAD implant on 08/13 + clipping left atrial appendage    Admitted to [facility] 11/21 with CP. Found to have VT and EF 25-30% mild AI, Charles and TR. Transferred to [facility]. Cath showed 70% prox LAD with no intervention.    S/P CRT-D upgrade 06/08/21.   6/24, admitted w/ acute CVA due to right M1 occlusion treated w/ mechanical thrombectomy.   Echo showed LV markedly dilated EF < 20% and small effusion. RV ok. CVA felt to be cardioembolic.   Admitted in 7/24 for low-output HF. Angiography showed mild nonobstructive CAD. RHC hemodynamics c/w severe NICM w/ severely elevated filling pressures and low output state c/w cardiogenic shock. Repeat PFTs w/ severe obstructive defect (FEV1 1.04L, FEV1/FVC 48%) +response to bronchodilator.   Underwent LVAD implant on 08/13 + clipping left atrial appendage d/t severe thickening and invagination of mitral valve annulus impeding flows. Apical core sent to pathology - no mention of sarcoid.   Post VAD implant c/b left-sided hemiplegia. CT head 8/14 with acute R MCA infarct. Taken to IR for thrombectomy. F/u head CT with small hemorrhagic conversion. Had some residual deficits w/ mild left sided weakness but otherwise recovered. Course further c/b enterococcus faecalis bacteremia w/ 2/2 + cultures on 9/6. ID consulted. Recommended ampicillin and ceftriaxone. PICC removed. Repeat BC X 2 09/08 NGTD. Will need 6 wks total therapy. EOD 11/04/22.   Here for routine f/u. Charles Holmes feels great. Recent w/u for painless hematuria reviewed and appeared benign. No recurrence. Able to do all ADLs without problem. Only complaint is insomnia. Trazodone didn't work. .Denies orthopnea or PND. No fevers, chills or problems with driveline. No bleeding, melena or  neuro symptoms. No VAD alarms. Taking all meds as prescribed.    VAD Indication: Destination Therapy - sarcoid   LVAD assessment: HM III   Primary Controller: VAD Speed: 5400 rpms Flow: 4.0 Power: 3.9 w    PI: 4.8 Alarms none Events: > 100 PI events today  Fixed speed: 5400 Low speed limit: 5100   Primary controller: back up battery due for replacement in 24 months Secondary controller: back up battery due for replacement in 24 months   I reviewed the LVAD parameters from today and compared the results to the patient's prior recorded data. LVAD interrogation was NEGATIVE for significant power changes, NEGATIVE for clinical alarms and STABLE for PI events/speed drops. No programming changes were made and pump is functioning within specified parameters. Pt is performing daily controller and system monitor self tests along with completing weekly and monthly maintenance for LVAD equipment.   LVAD equipment check completed and is in good working order. Back-up equipment not present at today's visit.   Annual Equipment Maintenance on UBC/PM was performed on 08/29/22.   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 possible cardiac sarcoidosis.   Vaccine counseling 11/19/2022   Wide-complex tachycardia 2021   wears LifeVest    Current Outpatient Medications  Medication Sig Dispense Refill   acetaminophen (TYLENOL) 325 MG tablet Take 1-2 tablets (325-650 mg total) by mouth every 4 (four) hours  as needed for mild pain.     amoxicillin (AMOXIL) 500 MG tablet Take 2 tablets (1,000 mg total) by mouth 2 (two) times daily. 360 tablet 3   aspirin EC 81 MG tablet Take 1 tablet (81 mg total) by mouth daily. Swallow whole. 120 tablet 0   atorvastatin (LIPITOR) 80 MG tablet Take 1 tablet (80 mg total) by mouth daily. 30 tablet 0   dorzolamide-timolol (COSOPT) 22.3-6.8 MG/ML ophthalmic solution Place 1 drop into both eyes in the morning and at bedtime.     fluticasone-salmeterol (ADVAIR HFA) 230-21 MCG/ACT inhaler Inhale 2 puffs into the lungs 2 (two) times daily. 1 each 12   latanoprost (XALATAN) 0.005 % ophthalmic solution Place 1 drop into both eyes at bedtime.     mexiletine (MEXITIL) 250 MG capsule TAKE 1 CAPSULE BY MOUTH 2 TIMES DAILY. 180 capsule 3   Multiple Vitamin (MULTIVITAMIN WITH MINERALS) TABS tablet Take 1 tablet by mouth daily.     pantoprazole (PROTONIX) 40 MG tablet Take 1 tablet (40 mg total) by mouth daily before breakfast. 30 tablet 0   sertraline (ZOLOFT) 50 MG tablet TAKE 1 TABLET BY MOUTH EVERY DAY 90 tablet 3   sildenafil (REVATIO) 20 MG tablet Take 1 tablet (20 mg total) by mouth 3 (three) times daily. 90 tablet 11   spironolactone (ALDACTONE) 25 MG tablet Take 1 tablet (25 mg total) by mouth daily. 90 tablet 3   warfarin (COUMADIN) 3 MG tablet Take 6 mg (2 tabs) every Monday/Wednesday/Friday and 3 mg (1 tab) all other days or as directed by the advanced heart failure clinic 180 tablet 3   albuterol (PROVENTIL) (2.5 MG/3ML) 0.083% nebulizer solution Take 3 mLs (2.5 mg total) by nebulization every 4 (four) hours as needed for wheezing or shortness of breath. (Patient not taking: Reported on 03/23/2023) 75 mL 12   nystatin cream (MYCOSTATIN) Apply 1 Application topically 2 (two) times daily. (Patient not taking: Reported on 03/23/2023) 30 g 3   traZODone (DESYREL) 100 MG tablet Take 1 tablet (100 mg total) by mouth at bedtime. (Patient not taking: Reported on 03/23/2023) 90 tablet 3    No current facility-administered medications for this encounter.    Pacerone [amiodarone] and Percocet [oxycodone-acetaminophen]    Vital Signs:  Doppler Pressure: 100 Automatic BP: 101/85 (92) HR:  75 SPO2: 91% RA   Weight: 174 lb w/ eqt Discharge weight: 181.2 lb w/ eqt   Physical Exam: General:  NAD.  HEENT: normal  Neck: supple. JVP not elevated.  Carotids 2+ bilat; no bruits. No lymphadenopathy or thryomegaly appreciated. Cor: LVAD hum.  Lungs: Clear. Abdomen: obese soft, nontender, non-distended. No hepatosplenomegaly. No bruits or masses. Good bowel sounds. Driveline site clean. Anchor in place.  Extremities: no cyanosis, clubbing, rash. Warm no edema  Neuro: alert & oriented x 3. Minimal LUE weakness Moves all 4 without problem    ASSESSMENT AND PLAN:   1.  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 Presented with VT. LHC 70% LAD  - cMRI 12/21 concerning for sarcoid and EF 18%.  - PET 2/22 at [facility] EF 25% + active sarcoid - Echo 08/26/20 EF <  20% severely dilated LV RV mildly decreased.  - Medtronic CRT-D upgrade in 06/08/21 - Echo 07/10/22: EF <20%, RV okay, mod pericardial effusion, mod Charles/TR - Nonobstructive CAD, severely elevated filling pressures and low Fick CO/CI (2.7/1.4) - 08/29/22 HM III LVAD implant + clipping LAA d/t severe thickening and invagination of mitral valve annulus impeding flows.  - Doing well with VAD support. NYHA I - Volume status ok - Continue spiro   2. HM-3 LVAD - VAD interrogated personally. Parameters stable. Does have frequent PI events but no change. No LOW FLOWs - LDH 212 - DL site ok (on amox) - MAPs look good - Hgb 11.8 -> 12.5 -> 15.9 - INR 3.4. No bleeding  recently. Goal 2.0-3.0. ASA 81 mg daily (on for CVA). Discussed warfarin dosing with PharmD personally.   3.  H/o stroke - Admitted 06/24 w/ R MCA stroke. S/p TPA and mechanical clot extraction. No residual deficits. Likely cardioembolic in setting  of severe LV dysfunction. - Developed left sided weakness 08/14. CTA with R MCA infarct. Taken to IR for thrombectomy - Repeat CT head with small to moderate size hemorrhagic conversion.  - Recovered well with just mild LUE weakness.No change - Continue warfarin/ASA  4. Enterococcus faecalis bacteremia - Bcx 2/2 on 9/6 - ID consult 9/6 -> ampicillin and ceftriaxone - Echo 09/10 - no obvious vegetations - Completed 6 weeks of IV Ampicillin and Ceftriaxone. Now on chronic suppression with Amoxicillin. - DL site looks good today   5. Hx VT - ln setting of potential sarcoid heart disease though core biopsy negative at time iff== - Off amio due to tremor. Continue mexiletine  - now s/p ICD. - No recent VT   6. CAD - LHC 12/07/19 70-% LAD, no intervention - LHC 8/24 non obstructive CAD.  - Continue statin. On aspirin for VAD. - No s/s angina   7. 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  - holding methotrexate w/ recent surgery and active infection, can discuss timing of restarting the medication at outpatient follow-up - apical core pathology not diagnostic of cardiac sarcoidosis.  - PET negative for sarcoid, Will not restart methotrexate - No change   8. 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 Pulmonary  - PFTs with severe obstructive defect, response to bronchodilator. FEV1 1.04L, FEV1/FVC 48% - stable  10. Insomnia - suggested trial of melatonin  I spent a total of 42 minutes today: 1) reviewing the patient's medical records including previous charts, labs and recent notes from other providers; 2) examining the patient and counseling them on their medical issues/explaining the plan of care; 3) adjusting meds as needed and 4) ordering lab work or other needed tests.    Arvilla Meres, MD  3:19 PM

## 2023-03-26 NOTE — Progress Notes (Unsigned)
  Electrophysiology Office Note:   ID:  Jadrien, Narine [birth year], MRN [id number]  Primary Cardiologist: Norman Herrlich, MD Electrophysiologist: Regan Lemming, MD  {Click to update primary MD,subspecialty MD or APP then REFRESH:1}    History of Present Illness:   Charles Holmes is a 58 y.o. male with h/o CHB, chronic systolic CHF s/p LVAD, CAD, h/o VT, and cardiac sarcoidosis seen today for routine electrophysiology followup.   Since last being seen in our clinic the patient reports doing ***.  he denies chest pain, palpitations, dyspnea, PND, orthopnea, nausea, vomiting, dizziness, syncope, edema, weight gain, or early satiety.   Review of systems complete and found to be negative unless listed in HPI.   EP Information / Studies Reviewed:    {EKGtoday:28818}       ICD Interrogation-  reviewed in detail today,  See PACEART report.  Arrhythmia/Device History Medtronic ICD implanted 02/2018, CRT-D upgrade 05/2021  HM 3 LVAD pt- Dr Gala Romney VAD pager: [phone]    Physical Exam:   VS:  There were no vitals taken for this visit.   Wt Readings from Last 3 Encounters:  03/23/23 174 lb (78.9 kg)  02/26/23 176 lb 3.2 oz (79.9 kg)  01/26/23 181 lb 3.2 oz (82.2 kg)     GEN: No acute distress *** NECK: No JVD; No carotid bruits CARDIAC: {EPRHYTHM:28826}, no murmurs, rubs, gallops RESPIRATORY:  Clear to auscultation without rales, wheezing or rhonchi  ABDOMEN: Soft, non-tender, non-distended EXTREMITIES:  {EDEMA LEVEL:28147::"No"} edema; No deformity   ASSESSMENT AND PLAN:    Chronic systolic CHF  s/p Medtronic CRT-D  S/p LVAD HM3 euvolemic today Stable on an appropriate medical regimen Normal ICD function See Pace Art report No changes today Follows with HF team.   H/o VT Cardiac sarcoidosis Managed on Mexitil 50 mg BID Amiodarone previously stopped ***  CAD Denies s/s ischemia     Disposition:   Follow up with {EPPROVIDERS:28135} {EPFOLLOW  UP:28173}   Signed, Graciella Freer, PA-C

## 2023-03-27 ENCOUNTER — Encounter: Payer: Self-pay | Admitting: Student

## 2023-03-27 ENCOUNTER — Ambulatory Visit: Attending: Student | Admitting: Student

## 2023-03-27 VITALS — BP 104/0 | HR 71 | Ht 68.0 in | Wt 176.6 lb

## 2023-03-27 DIAGNOSIS — I480 Paroxysmal atrial fibrillation: Secondary | ICD-10-CM | POA: Diagnosis not present

## 2023-03-27 DIAGNOSIS — I50810 Right heart failure, unspecified: Secondary | ICD-10-CM | POA: Diagnosis not present

## 2023-03-27 DIAGNOSIS — I472 Ventricular tachycardia, unspecified: Secondary | ICD-10-CM | POA: Diagnosis not present

## 2023-03-27 DIAGNOSIS — I442 Atrioventricular block, complete: Secondary | ICD-10-CM | POA: Diagnosis not present

## 2023-03-27 DIAGNOSIS — I5022 Chronic systolic (congestive) heart failure: Secondary | ICD-10-CM

## 2023-03-27 LAB — CUP PACEART INCLINIC DEVICE CHECK
Battery Remaining Longevity: 64 mo
Battery Voltage: 2.99 V
Brady Statistic AP VP Percent: 1.06 %
Brady Statistic AP VS Percent: 0.05 %
Brady Statistic AS VP Percent: 94.52 %
Brady Statistic AS VS Percent: 4.37 %
Brady Statistic RA Percent Paced: 1.1 %
Brady Statistic RV Percent Paced: 92.9 %
Date Time Interrogation Session: 20250311111057
HighPow Impedance: 69 Ohm
Implantable Lead Connection Status: 753985
Implantable Lead Connection Status: 753985
Implantable Lead Connection Status: 753985
Implantable Lead Implant Date: 20220204
Implantable Lead Implant Date: 20220204
Implantable Lead Implant Date: 20230523
Implantable Lead Location: 753858
Implantable Lead Location: 753859
Implantable Lead Location: 753860
Implantable Lead Model: 4598
Implantable Lead Model: 5076
Implantable Pulse Generator Implant Date: 20230523
Lead Channel Impedance Value: 185.725
Lead Channel Impedance Value: 185.725
Lead Channel Impedance Value: 195.302
Lead Channel Impedance Value: 218.5 Ohm
Lead Channel Impedance Value: 231.878
Lead Channel Impedance Value: 266 Ohm
Lead Channel Impedance Value: 323 Ohm
Lead Channel Impedance Value: 342 Ohm
Lead Channel Impedance Value: 399 Ohm
Lead Channel Impedance Value: 437 Ohm
Lead Channel Impedance Value: 437 Ohm
Lead Channel Impedance Value: 494 Ohm
Lead Channel Impedance Value: 627 Ohm
Lead Channel Impedance Value: 703 Ohm
Lead Channel Impedance Value: 703 Ohm
Lead Channel Impedance Value: 760 Ohm
Lead Channel Impedance Value: 779 Ohm
Lead Channel Impedance Value: 817 Ohm
Lead Channel Pacing Threshold Amplitude: 0.5 V
Lead Channel Pacing Threshold Amplitude: 0.75 V
Lead Channel Pacing Threshold Amplitude: 1.25 V
Lead Channel Pacing Threshold Pulse Width: 0.4 ms
Lead Channel Pacing Threshold Pulse Width: 0.4 ms
Lead Channel Pacing Threshold Pulse Width: 0.8 ms
Lead Channel Sensing Intrinsic Amplitude: 0.5 mV
Lead Channel Sensing Intrinsic Amplitude: 0.625 mV
Lead Channel Sensing Intrinsic Amplitude: 1.125 mV
Lead Channel Sensing Intrinsic Amplitude: 1.375 mV
Lead Channel Setting Pacing Amplitude: 1.5 V
Lead Channel Setting Pacing Amplitude: 2 V
Lead Channel Setting Pacing Amplitude: 2.5 V
Lead Channel Setting Pacing Pulse Width: 0.4 ms
Lead Channel Setting Pacing Pulse Width: 1 ms
Lead Channel Setting Sensing Sensitivity: 0.3 mV

## 2023-03-27 NOTE — Patient Instructions (Signed)
 Medication Instructions:  Your physician recommends that you continue on your current medications as directed. Please refer to the Current Medication list given to you today.  *If you need a refill on your cardiac medications before your next appointment, please call your pharmacy*  Lab Work: None ordered If you have labs (blood work) drawn today and your tests are completely normal, you will receive your results only by: MyChart Message (if you have MyChart) OR A paper copy in the mail If you have any lab test that is abnormal or we need to change your treatment, we will call you to review the results.  Follow-Up: At [facility], you and your health needs are our priority.  As part of our continuing mission to provide you with exceptional heart care, we have created designated Provider Care Teams.  These Care Teams include your primary Cardiologist (physician) and Advanced Practice Providers (APPs -  Physician Assistants and Nurse Practitioners) who all work together to provide you with the care you need, when you need it.  Your next appointment:   6 month(s)  Provider:   Casimiro Needle "Otilio Saber, PA-C

## 2023-03-28 ENCOUNTER — Ambulatory Visit: Attending: Cardiovascular Disease

## 2023-03-28 ENCOUNTER — Ambulatory Visit (HOSPITAL_COMMUNITY): Payer: Self-pay | Admitting: Pharmacist

## 2023-03-28 DIAGNOSIS — Z0000 Encounter for general adult medical examination without abnormal findings: Secondary | ICD-10-CM

## 2023-03-28 LAB — POCT INR: INR: 3 (ref 2.0–3.0)

## 2023-03-28 NOTE — Progress Notes (Signed)
 Appointment Outcome: Completed, Session #: 2                        Start time: 12:31pm   End time: 1:04pm   Total Mins: 33 minutes  AGREEMENTS SECTION    Overall Goal(s): Increase exercise to a minimum of 150 minutes per week to build lean muscle and increase endurance over the next 3 months. Improve healthy eating habits to increase iron labs to maximize energy levels by eating food sources that are rich in iron  Improve healthy eating habits to reduce consumption of junk food while watching tv by eating healthy snacks  Agreement/Action Steps:  Increase exercise to a minimum of 150 minutes per week Walk 15-20 minutes daily Perform 15-20 jumping jacks 2-3xs/week for 5-10 minutes Determine additional strength building exercises can be performed safely with LVAD by calling office.  Improve healthy eating  Review iron-rich foods handouts to determine what foods can be included/increased in overall diet Review Snack Time handout for healthy snack ideas that can be consumed to reduce consumption of junk food    Progress Notes:  Patient stated that he only performed jumping jacks once since the last health coaching visit. Patient determined that it was not safe to included them in a workout routine because the batteries continuously fell out of his LVAD shirt pockets when he would jump, causing pulling.   Patient spoke with Dr. Gala Romney and [name], RN about safe exercise options, and informed me today that he cannot do sit ups because it could cause a rupture, however, he could do a partial sit up/crunch and will have to figure it out. Patient expressed desire to lose weight and stated that he lost 6 pounds since his last visit.   Patient shared that he has continued to walk as planned. Patient mentioned that he walks half the distance of his driveway which is about 1200 ft long. Patient stated that it's easy walking downhill, but the incline is steep coming back up the driveway.  Patient mentioned that he must take breaks because of getting winded when he walks back up the hill. Patient stated that he also walks at his dad's 1/4 mile at least 3xs/week.  Patient stated that he has not focused on what he eats outside of not adding salt to his meal after it is prepared. Patient mentioned that he is aware that he should be mindful of consuming a low sodium diet. Patient stated that he has been eating oatmeal and grits for breakfast, which kept him from buying donuts until yesterday for breakfast since the last health coaching appointment on 2/17.   Patient shared that he has not been eating with his daughter due to change in her schedule and has been eating dinner with his mother, microwaving leftovers or [employer] frozen dinners. Patient stated that he could do better with his eating; he just eats whatever is put in front of him and don't pay attention to what it is he eats.    Coaching Outcomes: Patient stated that he wants to focus on exercising to increase his primary goal of increasing stamina.  Patient is planning on increasing his walking to 2xs/daily for 15-20 minutes depending on the condition of the weather. Patient's first walk takes place after 9am and his second walk will take place before dark but with no specific time due to varying schedule.   Patient expressed interest in still wanting to include other exercises into a routine to  help with losing weight and increasing stamina but is not quite sure what exercises to include.  Patient will implement the following health coaching agreement/action steps over the next two weeks as outlined below:   Overall Goal(s): Increase exercise to a minimum of 150 minutes per week to build lean muscle and increase endurance over the next 3 months.   Agreement/Action Steps:  Increase exercise to a minimum of 150 minutes per week Walk 15-20 minutes daily Cover 1/2 distance down/up driveway 1-2xs/daily Up to 3xs/week at  dad's 1/4 mile each time   Attempted: Fulfilled - Patient spoke with provider/coordinator at office about safe exercises to perform. Patient has continued to walk daily as originally planned.    Not attempted: Dropped/Revised - patient determined that focusing on healthy eating was not a concern for him at this time to help work towards his primary goal to increase his stamina. Patient determined that performing jumping jacks was not an exercise that he could safely include into a workout routine.     Referrals: Sent staff message to Dr. Gala Romney and [name], RN to determine what are safe exercise options for LVAD patients.  Resources: N/A

## 2023-03-29 ENCOUNTER — Other Ambulatory Visit: Payer: Self-pay | Admitting: Urology

## 2023-04-03 ENCOUNTER — Telehealth (HOSPITAL_COMMUNITY): Payer: Self-pay | Admitting: Licensed Clinical Social Worker

## 2023-04-03 ENCOUNTER — Encounter: Payer: Self-pay | Admitting: Cardiology

## 2023-04-03 ENCOUNTER — Telehealth (HOSPITAL_COMMUNITY): Payer: Self-pay | Admitting: *Deleted

## 2023-04-03 ENCOUNTER — Telehealth: Payer: Self-pay | Admitting: *Deleted

## 2023-04-03 NOTE — Telephone Encounter (Addendum)
 Pt is scheduled for CYSTOURETEROSCOPY, WITH RETROGRADE PYELOGRAM AND STENT INSERTION on 04/17/23 with Dr Arita Miss. INR goal < 2.0 per Dr Arita Miss.  Discussed with Dr Gala Romney. Plan INR goal 1.6 - 1.8 due to recent stroke. May hold Coumadin 2 days prior to admission. Will plan to admit pt on Monday 04/16/23 for Heparin bridge. Karle Plumber PharmD aware of plan.   Spoke with pt about plan for admission on 04/16/23. Advised Karle Plumber PharmD will call him with Coumadin dosing instructions. He verbalized understanding.   Sent message to Dr Arita Miss updating on plan for admission.   [name] RN VAD Coordinator  Office: [phone]  24/7 Pager: [phone]

## 2023-04-03 NOTE — Telephone Encounter (Signed)
 CSW contacted patient to share about the upcoming LVAD Men's Group on Thursday March 27 at 1:30 pm in the Heart and Vascular Conference Room. Patient expressed interest. Lasandra Beech, LCSW, CCSW-MCS [phone]

## 2023-04-03 NOTE — Telephone Encounter (Signed)
   Pre-operative Risk Assessment    Patient Name: Charles Holmes  DOB: April 03, 1965 MRN: [id number]   Date of last office visit: 03/27/2023 Date of next office visit: None  Request for Surgical Clearance    Procedure:   Cystoscopy, Bilateral Retrograde Pyelogram, Right Diagnostic ureteroscopy, Possible Biopsy, Possible stent, Possible left diagnostic ureteroscopy, possible biopsy.   Date of Surgery:  Clearance 04/17/23                                 Surgeon:  [facility] Surgeon's Group or Practice Name:  Dr. Kasandra Knudsen Phone number:  [phone] Fax number:  [phone]   Type of Clearance Requested:   - Medical  - Pharmacy:  Hold Aspirin and Warfarin (Coumadin) Not Indicated.   Type of Anesthesia:  Not Indicated   Additional requests/questions:  Pt has medtronic, sent to device team.   Signed, Emmit Pomfret   04/03/2023, 11:11 AM

## 2023-04-03 NOTE — Telephone Encounter (Signed)
 Charles Holmes,  [name] is requesting preoperative cardiac evaluation for cystoscopy, bilateral retrograde pyelogram.  He was recently seen by you in clinic on 03/27/2023.  He was stable from a cardiac standpoint at that time.  Would you be able to comment on cardiac risk for his upcoming procedure?  Please direct your response to CV DIV preop pool.  Thank you for your help.  Thomasene Ripple. Charles Whitacre NP-C     04/03/2023, 3:52 PM [facility] [address] Office [phone] Fax [phone]

## 2023-04-03 NOTE — Progress Notes (Signed)
 PERIOPERATIVE PRESCRIPTION FOR IMPLANTED CARDIAC DEVICE PROGRAMMING   Patient Information:  Patient: Charles Holmes  MRN: [id number]  Date of Birth: 1965/06/22      Procedure:   Cystoscopy, Bilateral Retrograde Pyelogram, Right Diagnostic ureteroscopy, Possible Biopsy, Possible stent, Possible left diagnostic ureteroscopy, possible biopsy.  Date of Surgery:  Clearance 04/17/23                              Surgeon:  [facility]geon[facility] Group or Practice Name:  Dr. Kasandra Knudsen Phone number:  [phone] Fax number:  [phone]   Device Information:   Clinic EP Physician:   Dr. Loman Brooklyn Device Type:  Defibrillator Manufacturer and Phone #:  Medtronic: [id number] Pacemaker Dependent?:  No Date of Last Device Check:  03/27/2023        Normal Device Function?:  Yes     Electrophysiologist's Recommendations:   Have magnet available. Provide continuous ECG monitoring when magnet is used or reprogramming is to be performed.  Procedure may interfere with device function.  Magnet should be placed over device during procedure.  Per Device Clinic Standing Orders, Lenor Coffin  04/03/2023 11:31 AM

## 2023-04-03 NOTE — Telephone Encounter (Signed)
 Patient with diagnosis of LVAD on warfarin for anticoagulation.    Procedure:   Cystoscopy, Bilateral Retrograde Pyelogram, Right Diagnostic ureteroscopy, Possible Biopsy, Possible stent, Possible left diagnostic ureteroscopy, possible biopsy.    Date of Surgery:  Clearance 04/17/23  Reviewed with PharmD from HF clinic Karle Plumber): Pt scheduled for cysto w/ pyelogram w/ stent insert 4/1. INR goal < 2. Per Dr Gala Romney will plan to admit 3/31 for Heparin bridge. DB would like INR 1.6-1.8 d/t hx stroke. Said he could hold Coumadin 2 days prior to admission.  Will hold warfarin 3/29 and 3/30.  Admit on 3/31 for heparin bridge.  HF clinic will make patient aware of this plan.        **This guidance is not considered finalized until pre-operative APP has relayed final recommendations.**

## 2023-04-03 NOTE — Telephone Encounter (Signed)
 Preoperative team, patient has an LVAD.  Recommendations for upcoming procedure as well as anticoagulation regulation will be managed by advanced heart failure team.  I will defer further recommendations to advanced heart failure and removed from preoperative pool.  Please forward request to advanced heart failure.  Thank you.  Thomasene Ripple. Gilmar Bua NP-C     04/03/2023, 4:25 PM [facility] [address] Office [phone] Fax [phone]

## 2023-04-04 ENCOUNTER — Ambulatory Visit (HOSPITAL_COMMUNITY): Payer: Self-pay | Admitting: Pharmacist

## 2023-04-04 LAB — POCT INR: INR: 2.7 (ref 2.0–3.0)

## 2023-04-04 NOTE — Telephone Encounter (Signed)
 I will forward to [facility] as per preop APP, as well as FYI to requesting office.

## 2023-04-10 NOTE — Progress Notes (Signed)
 Reviewed patient chart for pre-op interview earlier today noted patient has a LVAD and ICD with very complex multiple multiple history this patient is scheduled in [facility] on 04-17-2023 [facility] by Dr Arita Miss.  Patient acuity was not noted on special needs. But have noted that progress notes conversion's in epic between LVAD coordinator/ EP cardiology/ and cardiology pharmacist were started about the patient .  Called and spoke w/ my supervisor , Juanda Crumble RN and AD, Maudry Diego RN for [employer].   By this time it appears this patient will be admitted on 04-16-2023 day before surgery to [facility] and policies and protocols will be followed. Maudry Diego RN sent an email out to all involved to give in heads / update and we would be available if needed.

## 2023-04-11 ENCOUNTER — Ambulatory Visit (HOSPITAL_BASED_OUTPATIENT_CLINIC_OR_DEPARTMENT_OTHER)

## 2023-04-11 ENCOUNTER — Ambulatory Visit (HOSPITAL_COMMUNITY): Payer: Self-pay | Admitting: Pharmacist

## 2023-04-11 DIAGNOSIS — Z0000 Encounter for general adult medical examination without abnormal findings: Secondary | ICD-10-CM

## 2023-04-11 LAB — POCT INR: INR: 2.5 (ref 2.0–3.0)

## 2023-04-11 NOTE — Progress Notes (Addendum)
 Appointment Outcome:  Completed, Session #: Final                        Start time: 12:35pm  End time: 12:45pm   Total Mins: 10 minutes  AGREEMENTS SECTION   Patient stated that he was out with his sister at a doctor's appointment and would not be able to complete the full session.   Overall Goal(s): Increase exercise to a minimum of 150 minutes per week to build lean muscle and increase endurance over the next 3 months.     Agreement/Action Steps:  Increase exercise to a minimum of 150 minutes per week Walk 15-20 minutes daily Cover 1/2 distance down/up driveway 1-2xs/daily Up to 3xs/week at dad's 1/4 mile each time    Progress Notes:  Patient reported that over the past two weeks that he has continued to walk once daily, but have not been strenuous with walking because he has been resting in preparation for his upcoming procedure.  Patient expressed that he would resume his normal walking routine after recovering from the procedure.     Coaching Outcomes: An agreement was made between the patient and I that today would be the final session for health coaching.   Patient stated that he is confident that after the procedure he will be able to get back on track.  Patient mentioned informing his provider if he needed additional support from health coaching moving forward.   Patient has been completed out of the health coaching appointment on 04/11/23.    Attempted: Partial - Patient was able to walk once daily, but no additional walking wad done as planned.

## 2023-04-13 ENCOUNTER — Encounter (HOSPITAL_COMMUNITY): Payer: Self-pay

## 2023-04-16 ENCOUNTER — Encounter (HOSPITAL_COMMUNITY): Payer: Self-pay | Admitting: Cardiology

## 2023-04-16 ENCOUNTER — Other Ambulatory Visit: Payer: Self-pay

## 2023-04-16 ENCOUNTER — Inpatient Hospital Stay (HOSPITAL_COMMUNITY)
Admission: RE | Admit: 2023-04-16 | Discharge: 2023-04-24 | DRG: 660 | Disposition: A | Discharge status: Home / Self Care | Attending: Cardiology | Admitting: Cardiology

## 2023-04-16 DIAGNOSIS — Z8679 Personal history of other diseases of the circulatory system: Secondary | ICD-10-CM

## 2023-04-16 DIAGNOSIS — Z87891 Personal history of nicotine dependence: Secondary | ICD-10-CM

## 2023-04-16 DIAGNOSIS — Z8619 Personal history of other infectious and parasitic diseases: Secondary | ICD-10-CM

## 2023-04-16 DIAGNOSIS — Z792 Long term (current) use of antibiotics: Secondary | ICD-10-CM | POA: Diagnosis not present

## 2023-04-16 DIAGNOSIS — Z7951 Long term (current) use of inhaled steroids: Secondary | ICD-10-CM

## 2023-04-16 DIAGNOSIS — Z7901 Long term (current) use of anticoagulants: Secondary | ICD-10-CM

## 2023-04-16 DIAGNOSIS — I509 Heart failure, unspecified: Secondary | ICD-10-CM | POA: Diagnosis not present

## 2023-04-16 DIAGNOSIS — Z823 Family history of stroke: Secondary | ICD-10-CM | POA: Diagnosis not present

## 2023-04-16 DIAGNOSIS — J449 Chronic obstructive pulmonary disease, unspecified: Secondary | ICD-10-CM | POA: Diagnosis present

## 2023-04-16 DIAGNOSIS — I2510 Atherosclerotic heart disease of native coronary artery without angina pectoris: Secondary | ICD-10-CM | POA: Diagnosis not present

## 2023-04-16 DIAGNOSIS — N401 Enlarged prostate with lower urinary tract symptoms: Secondary | ICD-10-CM | POA: Diagnosis present

## 2023-04-16 DIAGNOSIS — I5022 Chronic systolic (congestive) heart failure: Secondary | ICD-10-CM | POA: Diagnosis not present

## 2023-04-16 DIAGNOSIS — I69854 Hemiplegia and hemiparesis following other cerebrovascular disease affecting left non-dominant side: Secondary | ICD-10-CM | POA: Diagnosis not present

## 2023-04-16 DIAGNOSIS — Z95811 Presence of heart assist device: Secondary | ICD-10-CM | POA: Diagnosis not present

## 2023-04-16 DIAGNOSIS — Z602 Problems related to living alone: Secondary | ICD-10-CM | POA: Diagnosis not present

## 2023-04-16 DIAGNOSIS — R350 Frequency of micturition: Secondary | ICD-10-CM | POA: Diagnosis not present

## 2023-04-16 DIAGNOSIS — I110 Hypertensive heart disease with heart failure: Secondary | ICD-10-CM | POA: Diagnosis not present

## 2023-04-16 DIAGNOSIS — Z7982 Long term (current) use of aspirin: Secondary | ICD-10-CM | POA: Diagnosis not present

## 2023-04-16 DIAGNOSIS — Z9889 Other specified postprocedural states: Secondary | ICD-10-CM

## 2023-04-16 DIAGNOSIS — Z841 Family history of disorders of kidney and ureter: Secondary | ICD-10-CM

## 2023-04-16 DIAGNOSIS — R251 Tremor, unspecified: Secondary | ICD-10-CM | POA: Diagnosis present

## 2023-04-16 DIAGNOSIS — I5023 Acute on chronic systolic (congestive) heart failure: Secondary | ICD-10-CM | POA: Diagnosis not present

## 2023-04-16 DIAGNOSIS — D8685 Sarcoid myocarditis: Secondary | ICD-10-CM | POA: Diagnosis not present

## 2023-04-16 DIAGNOSIS — I480 Paroxysmal atrial fibrillation: Secondary | ICD-10-CM | POA: Diagnosis present

## 2023-04-16 DIAGNOSIS — E785 Hyperlipidemia, unspecified: Secondary | ICD-10-CM | POA: Diagnosis not present

## 2023-04-16 DIAGNOSIS — R310 Gross hematuria: Principal | ICD-10-CM | POA: Diagnosis present

## 2023-04-16 DIAGNOSIS — K219 Gastro-esophageal reflux disease without esophagitis: Secondary | ICD-10-CM | POA: Diagnosis present

## 2023-04-16 DIAGNOSIS — Z8249 Family history of ischemic heart disease and other diseases of the circulatory system: Secondary | ICD-10-CM | POA: Diagnosis not present

## 2023-04-16 DIAGNOSIS — Z885 Allergy status to narcotic agent status: Secondary | ICD-10-CM

## 2023-04-16 DIAGNOSIS — I428 Other cardiomyopathies: Secondary | ICD-10-CM | POA: Diagnosis not present

## 2023-04-16 DIAGNOSIS — Z95810 Presence of automatic (implantable) cardiac defibrillator: Secondary | ICD-10-CM

## 2023-04-16 DIAGNOSIS — Z4502 Encounter for adjustment and management of automatic implantable cardiac defibrillator: Secondary | ICD-10-CM

## 2023-04-16 DIAGNOSIS — Z79899 Other long term (current) drug therapy: Secondary | ICD-10-CM

## 2023-04-16 DIAGNOSIS — Z466 Encounter for fitting and adjustment of urinary device: Secondary | ICD-10-CM | POA: Diagnosis not present

## 2023-04-16 DIAGNOSIS — Z888 Allergy status to other drugs, medicaments and biological substances status: Secondary | ICD-10-CM

## 2023-04-16 DIAGNOSIS — Z5941 Food insecurity: Secondary | ICD-10-CM

## 2023-04-16 LAB — SURGICAL PCR SCREEN
MRSA, PCR: NEGATIVE
Staphylococcus aureus: NEGATIVE

## 2023-04-16 LAB — COMPREHENSIVE METABOLIC PANEL WITH GFR
ALT: 27 U/L (ref 0–44)
AST: 30 U/L (ref 15–41)
Albumin: 3.7 g/dL (ref 3.5–5.0)
Alkaline Phosphatase: 86 U/L (ref 38–126)
Anion gap: 7 (ref 5–15)
BUN: 10 mg/dL (ref 6–20)
CO2: 28 mmol/L (ref 22–32)
Calcium: 8.9 mg/dL (ref 8.9–10.3)
Chloride: 103 mmol/L (ref 98–111)
Creatinine, Ser: 0.93 mg/dL (ref 0.61–1.24)
GFR, Estimated: >60 mL/min (ref >60)
Glucose, Bld: 71 mg/dL (ref 70–99)
Potassium: 4.3 mmol/L (ref 3.5–5.1)
Sodium: 138 mmol/L (ref 135–145)
Total Bilirubin: 1.5 mg/dL — ABNORMAL HIGH (ref 0.0–1.2)
Total Protein: 6.2 g/dL — ABNORMAL LOW (ref 6.5–8.1)

## 2023-04-16 LAB — TYPE AND SCREEN
ABO/RH(D): O POS
ABO/RH(D): O POS
Antibody Screen: NEGATIVE
Antibody Screen: NEGATIVE

## 2023-04-16 LAB — CBC WITH DIFFERENTIAL/PLATELET
Abs Immature Granulocytes: 0.01 10*3/uL (ref 0.00–0.07)
Basophils Absolute: 0 10*3/uL (ref 0.0–0.1)
Basophils Relative: 0 %
Eosinophils Absolute: 0.1 10*3/uL (ref 0.0–0.5)
Eosinophils Relative: 1 %
HCT: 43.8 % (ref 39.0–52.0)
Hemoglobin: 14.3 g/dL (ref 13.0–17.0)
Immature Granulocytes: 0 %
Lymphocytes Relative: 18 %
Lymphs Abs: 1.3 10*3/uL (ref 0.7–4.0)
MCH: 29.5 pg (ref 26.0–34.0)
MCHC: 32.6 g/dL (ref 30.0–36.0)
MCV: 90.3 fL (ref 80.0–100.0)
Monocytes Absolute: 0.9 10*3/uL (ref 0.1–1.0)
Monocytes Relative: 12 %
Neutro Abs: 4.8 10*3/uL (ref 1.7–7.7)
Neutrophils Relative %: 69 %
Platelets: 162 10*3/uL (ref 150–400)
RBC: 4.85 MIL/uL (ref 4.22–5.81)
RDW: 20.7 % — ABNORMAL HIGH (ref 11.5–15.5)
WBC: 7.1 10*3/uL (ref 4.0–10.5)
nRBC: 0 % (ref 0.0–0.2)

## 2023-04-16 LAB — LACTATE DEHYDROGENASE: LDH: 210 U/L — ABNORMAL HIGH (ref 98–192)

## 2023-04-16 LAB — PROTIME-INR
INR: 2.4 — ABNORMAL HIGH (ref 0.8–1.2)
Prothrombin Time: 26.3 s — ABNORMAL HIGH (ref 11.4–15.2)

## 2023-04-16 LAB — MAGNESIUM: Magnesium: 1.9 mg/dL (ref 1.7–2.4)

## 2023-04-16 LAB — MRSA NEXT GEN BY PCR, NASAL: MRSA by PCR Next Gen: NOT DETECTED

## 2023-04-16 MED ORDER — ASPIRIN 81 MG PO TBEC
81.0000 mg | DELAYED_RELEASE_TABLET | Freq: Every day | ORAL | Status: DC
  Administered 2023-04-17 – 2023-04-19 (×3): 81 mg via ORAL
  Filled 2023-04-16 (×3): qty 1

## 2023-04-16 MED ORDER — SPIRONOLACTONE 25 MG PO TABS
25.0000 mg | ORAL_TABLET | Freq: Every day | ORAL | Status: DC
  Administered 2023-04-17 – 2023-04-20 (×4): 25 mg via ORAL
  Filled 2023-04-16 (×4): qty 1

## 2023-04-16 MED ORDER — MEXILETINE HCL 250 MG PO CAPS
250.0000 mg | ORAL_CAPSULE | Freq: Two times a day (BID) | ORAL | Status: DC
  Administered 2023-04-16 – 2023-04-24 (×16): 250 mg via ORAL
  Filled 2023-04-16 (×17): qty 1

## 2023-04-16 MED ORDER — SILDENAFIL CITRATE 20 MG PO TABS
20.0000 mg | ORAL_TABLET | Freq: Three times a day (TID) | ORAL | Status: DC
  Administered 2023-04-16 – 2023-04-24 (×23): 20 mg via ORAL
  Filled 2023-04-16 (×23): qty 1

## 2023-04-16 MED ORDER — AMOXICILLIN 500 MG PO CAPS
1000.0000 mg | ORAL_CAPSULE | Freq: Two times a day (BID) | ORAL | Status: DC
  Administered 2023-04-16 – 2023-04-24 (×16): 1000 mg via ORAL
  Filled 2023-04-16 (×17): qty 2

## 2023-04-16 MED ORDER — ADULT MULTIVITAMIN W/MINERALS CH
1.0000 | ORAL_TABLET | Freq: Every day | ORAL | Status: DC
  Administered 2023-04-17 – 2023-04-24 (×8): 1 via ORAL
  Filled 2023-04-16 (×8): qty 1

## 2023-04-16 MED ORDER — MELATONIN 5 MG PO TABS
5.0000 mg | ORAL_TABLET | Freq: Every day | ORAL | Status: DC
  Administered 2023-04-16 – 2023-04-23 (×8): 5 mg via ORAL
  Filled 2023-04-16 (×8): qty 1

## 2023-04-16 MED ORDER — PANTOPRAZOLE SODIUM 40 MG PO TBEC
40.0000 mg | DELAYED_RELEASE_TABLET | Freq: Every day | ORAL | Status: DC
  Administered 2023-04-17 – 2023-04-24 (×8): 40 mg via ORAL
  Filled 2023-04-16 (×8): qty 1

## 2023-04-16 MED ORDER — ATORVASTATIN CALCIUM 80 MG PO TABS
80.0000 mg | ORAL_TABLET | Freq: Every day | ORAL | Status: DC
  Administered 2023-04-17 – 2023-04-24 (×8): 80 mg via ORAL
  Filled 2023-04-16 (×8): qty 1

## 2023-04-16 NOTE — Anesthesia Preprocedure Evaluation (Signed)
 Anesthesia Evaluation  Patient identified by MRN, date of birth, ID band Patient awake    Reviewed: Allergy & Precautions, NPO status , Patient's Chart, lab work & pertinent test results  History of Anesthesia Complications Negative for: history of anesthetic complications  Airway Mallampati: II  TM Distance: >3 FB Neck ROM: Full    Dental  (+) Edentulous Lower, Edentulous Upper   Pulmonary COPD, former smoker   Pulmonary exam normal        Cardiovascular hypertension, + CAD and +CHF  Normal cardiovascular exam  Severe biventricular dysfunction, LVAD dependent   Neuro/Psych CVA    GI/Hepatic Neg liver ROS,GERD  Medicated,,  Endo/Other  negative endocrine ROS    Renal/GU hematuria     Musculoskeletal negative musculoskeletal ROS (+)    Abdominal   Peds  Hematology INR 2.2, Hgb 13.6, Plt 158k   Anesthesia Other Findings Day of surgery medications reviewed with patient.  Reproductive/Obstetrics                             Anesthesia Physical Anesthesia Plan  ASA: 4  Anesthesia Plan: General   Post-op Pain Management: Tylenol PO (pre-op)*   Induction: Intravenous  PONV Risk Score and Plan: 2 and Ondansetron, Treatment may vary due to age or medical condition, Midazolam and Dexamethasone  Airway Management Planned: LMA  Additional Equipment:   Intra-op Plan:   Post-operative Plan: Extubation in OR  Informed Consent: I have reviewed the patients History and Physical, chart, labs and discussed the procedure including the risks, benefits and alternatives for the proposed anesthesia with the patient or authorized representative who has indicated his/her understanding and acceptance.     Dental advisory given  Plan Discussed with: CRNA  Anesthesia Plan Comments:        Anesthesia Quick Evaluation

## 2023-04-16 NOTE — Progress Notes (Signed)
 PHARMACY - ANTICOAGULATION CONSULT NOTE  Pharmacy Consult for heparin when INR <1.8 Indication:  VAD patient  Allergies  Allergen Reactions   Pacerone [Amiodarone] Other (See Comments)    Severe tremors   Percocet [Oxycodone-Acetaminophen] Itching    Patient Measurements: Height: 5\' 8"  (172.7 cm) Weight: 80 kg (176 lb 6.4 oz) IBW/kg (Calculated) : 68.4 HEPARIN DW (KG): 80  Vital Signs: Temp: 98 F (36.7 C) (03/31 1513) Temp Source: Oral (03/31 1513) BP: 102/70 (03/31 1513) Pulse Rate: 63 (03/31 1513)  Labs: Recent Labs    04/16/23 1614  HGB 14.3  HCT 43.8  PLT 162  LABPROT 26.3*  INR 2.4*  CREATININE 0.93    Estimated Creatinine Clearance: 82.7 mL/min (by C-G formula based on SCr of 0.93 mg/dL).   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 possible cardiac sarcoidosis.   Vaccine counseling 11/19/2022   Wide-complex tachycardia 2021   wears LifeVest    Medications:  Medications Prior to Admission  Medication Sig Dispense Refill Last Dose/Taking   amoxicillin (AMOXIL) 500 MG tablet Take 2 tablets (1,000 mg total) by mouth 2 (two) times daily. 360 tablet 3 04/16/2023 Morning   aspirin EC 81 MG tablet Take 1 tablet (81 mg total) by mouth daily. Swallow whole. 120 tablet 0 Past Week   atorvastatin (LIPITOR) 80 MG tablet Take 1 tablet (80 mg total) by mouth daily. 30 tablet 0 04/16/2023 Morning   fluticasone-salmeterol (ADVAIR HFA) 230-21 MCG/ACT inhaler  Inhale 2 puffs into the lungs 2 (two) times daily. 1 each 12 04/16/2023 Morning   melatonin 5 MG TABS Take 5 mg by mouth at bedtime.   04/15/2023 Bedtime   mexiletine (MEXITIL) 250 MG capsule TAKE 1 CAPSULE BY MOUTH 2 TIMES DAILY. 180 capsule 3 04/16/2023 Morning   pantoprazole (PROTONIX) 40 MG tablet Take 1 tablet (40 mg total) by mouth daily before breakfast. 30 tablet 0 04/16/2023 Morning   sertraline (ZOLOFT) 50 MG tablet TAKE 1 TABLET BY MOUTH EVERY DAY 90 tablet 3 04/16/2023 Morning   sildenafil (REVATIO) 20 MG tablet Take 1 tablet (20 mg total) by mouth 3 (three) times daily. 90 tablet 11 04/16/2023 Morning   spironolactone (ALDACTONE) 25 MG tablet Take 1 tablet (25 mg total) by mouth daily. 90 tablet 3 04/16/2023 Morning   warfarin (COUMADIN) 3 MG tablet Take 6 mg (2 tabs) every Monday/Wednesday/Friday and 3 mg (1 tab) all other days or as directed by the advanced heart failure clinic (Patient taking differently: Take 3-6 mg by mouth See admin instructions. Take 2 tablets (6mg ) by mouth every Monday, Wednesday and Friday - at 1600. Take 1 tablet (3mg ) by mouth at 1600 on all other days.) 180 tablet 3 04/13/2023   Multiple Vitamin (MULTIVITAMIN WITH MINERALS) TABS tablet Take 1 tablet by mouth daily. (Patient not taking: Reported on 04/16/2023)   Not Taking    Assessment: 58 y.o. M presents for cystourethroscopy (scheduled 4/1 1045) for hematuria. Pt on warfarin PTA for LVAD.  Admission INR 2.4. CBC ok on admission. Home dose: 6mg  M/W/F and 3mg  all other days. Last dose taken 3/28. Held for surgery on 3/29 and 3/30.  Goal of Therapy:  INR 2-2.5 (per [facility] clinic notes 3/12 2025) Monitor platelets by anticoagulation protocol: Yes   Plan:  Continue to hold warfardin Start heparin when INR <1.8  Christoper Fabian, PharmD, BCPS Please see amion for complete clinical pharmacist phone list 04/16/2023,6:32 PM

## 2023-04-16 NOTE — H&P (Signed)
 Advanced Heart Failure VAD History and Physical Note   PCP-Cardiologist: Norman Herrlich, MD   Reason for Admission: Pre-op optimization for cystourethroscopy with retrograde pyelogram and stent insertion  HPI:    [name] is a 58 y.o. 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 Underwent HM- 3 LVAD implant on 08/13 + clipping left atrial appendage    Admitted to [facility] 11/21 with CP. Found to have VT and EF 25-30% mild AI, MR and TR. Transferred to [facility]. Cath showed 70% prox LAD with no intervention.    S/P CRT-D upgrade 06/08/21.   6/24, admitted w/ acute CVA due to right M1 occlusion treated w/ mechanical thrombectomy. Echo showed LV markedly dilated EF < 20% and small effusion. RV ok. CVA felt to be cardioembolic.    Admitted 7/24 for low-output HF. Angiography showed mild nonobstructive CAD. RHC hemodynamics c/w severe NICM w/ severely elevated filling pressures and low output state c/w cardiogenic shock. Repeat PFTs w/ severe obstructive defect (FEV1 1.04L, FEV1/FVC 48%) +response to bronchodilator.    Underwent LVAD implant on 08/13 + clipping left atrial appendage d/t severe thickening and invagination of mitral valve annulus impeding flows. Apical core sent to pathology - no mention of sarcoid. Post VAD implant c/b left-sided hemiplegia. CT head 8/14 with acute R MCA infarct. Taken to IR for thrombectomy. F/u head CT with small hemorrhagic conversion. Had some residual deficits w/ mild L sided weakness but otherwise recovered. Course further c/b enterococcus faecalis bacteremia w/ 2/2 + cultures on 9/6. ID consulted. Recommended ampicillin and ceftriaxone. PICC removed. Repeat BC X 2 09/08 NGTD. Will need 6 wks total therapy. EOD 11/04/22.   Has been followed by OP urology for gross hematuria that has been ongoing for the last 3-4 weeks. He presents today for direct admission for pre-op optimization for cystourethroscopy with  retrograde pyelogram and stent insertion. Has been holding Warfarin since Friday. Last hgb was 15.9 03/23/23.   Stable on assessment. Denies CP/SOB.   LVAD INTERROGATION:  HeartMate III LVAD:  Flow 4.3 liters/min, speed 5450, power 4, PI 5.4.   Unable to fully interrogate at this time.    Home Medications Prior to Admission medications   Medication Sig Start Date End Date Taking? Authorizing Provider  amoxicillin (AMOXIL) 500 MG tablet Take 2 tablets (1,000 mg total) by mouth 2 (two) times daily. 02/26/23  Yes Daiva Eves, Lisette Grinder, MD  aspirin EC 81 MG tablet Take 1 tablet (81 mg total) by mouth daily. Swallow whole. 10/17/22  Yes Setzer, Lynnell Jude, PA-C  atorvastatin (LIPITOR) 80 MG tablet Take 1 tablet (80 mg total) by mouth daily. 10/17/22  Yes Setzer, Lynnell Jude, PA-C  fluticasone-salmeterol (ADVAIR HFA) 230-21 MCG/ACT inhaler Inhale 2 puffs into the lungs 2 (two) times daily. 12/13/20  Yes Olalere, Adewale A, MD  melatonin 5 MG TABS Take 5 mg by mouth at bedtime.   Yes [provider]  mexiletine (MEXITIL) 250 MG capsule TAKE 1 CAPSULE BY MOUTH 2 TIMES DAILY. 11/14/22  Yes Bensimhon, Bevelyn Buckles, MD  Multiple Vitamin (MULTIVITAMIN WITH MINERALS) TABS tablet Take 1 tablet by mouth daily. 09/28/22  Yes Robbie Lis M, PA-C  nystatin cream (MYCOSTATIN) Apply 1 Application topically 2 (two) times daily. Patient taking differently: Apply 1 Application topically 2 (two) times daily as needed (rash). 11/09/22  Yes Bensimhon, Bevelyn Buckles, MD  pantoprazole (PROTONIX) 40 MG tablet Take 1 tablet (40 mg total) by mouth daily before breakfast.  10/17/22  Yes Setzer, Lynnell Jude, PA-C  sertraline (ZOLOFT) 50 MG tablet TAKE 1 TABLET BY MOUTH EVERY DAY 12/07/22  Yes Bensimhon, Bevelyn Buckles, MD  sildenafil (REVATIO) 20 MG tablet Take 1 tablet (20 mg total) by mouth 3 (three) times daily. 02/20/23  Yes Bensimhon, Bevelyn Buckles, MD  spironolactone (ALDACTONE) 25 MG tablet Take 1 tablet (25 mg total) by mouth daily.  11/13/22  Yes Bensimhon, Bevelyn Buckles, MD  warfarin (COUMADIN) 3 MG tablet Take 6 mg (2 tabs) every Monday/Wednesday/Friday and 3 mg (1 tab) all other days or as directed by the advanced heart failure clinic 02/19/23  Yes Bensimhon, Bevelyn Buckles, MD  traZODone (DESYREL) 100 MG tablet Take 1 tablet (100 mg total) by mouth at bedtime. 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 possible cardiac sarcoidosis.   Vaccine counseling 11/19/2022   Wide-complex tachycardia 2021   wears LifeVest    Past Surgical History: Past Surgical History:  Procedure Laterality Date   BIV UPGRADE N/A 06/07/2021   Procedure: BIV ICD UPGRADE;  Surgeon: Regan Lemming, MD;  Location: [facility] INVASIVE CV LAB;  Service: Cardiovascular;  Laterality: N/A;   CLIPPING OF ATRIAL APPENDAGE Left 08/29/2022   Procedure: CLIPPING OF ATRIAL APPENDAGE;  Surgeon: Lovett Sox, MD;  Location: [facility] OR;  Service: Open Heart Surgery;  Laterality: Left;   IABP INSERTION N/A 08/25/2022   Procedure: IABP Insertion;  Surgeon: Dolores Patty, MD;  Location: [facility] INVASIVE CV LAB;  Service: Cardiovascular;  Laterality: N/A;   ICD IMPLANT N/A 02/20/2020   Procedure: ICD IMPLANT;  Surgeon: Regan Lemming, MD;  Location: [facility] INVASIVE CV LAB;  Service: Cardiovascular;  Laterality: N/A;   INSERTION OF IMPLANTABLE LEFT VENTRICULAR ASSIST DEVICE N/A 08/29/2022    Procedure: INSERTION OF IMPLANTABLE LEFT VENTRICULAR ASSIST DEVICE;  Surgeon: Lovett Sox, MD;  Location: [facility] OR;  Service: Open Heart Surgery;  Laterality: N/A;   IR CT HEAD LTD  07/10/2022   IR CT HEAD LTD  08/30/2022   IR PERCUTANEOUS ART THROMBECTOMY/INFUSION INTRACRANIAL INC DIAG ANGIO  07/10/2022   IR PERCUTANEOUS ART THROMBECTOMY/INFUSION INTRACRANIAL INC DIAG ANGIO  08/30/2022   IR US GUIDE VASC ACCESS LEFT  08/30/2022   IR US GUIDE VASC ACCESS RIGHT  07/10/2022   RADIOLOGY WITH ANESTHESIA N/A 07/10/2022   Procedure: RADIOLOGY WITH ANESTHESIA;  Surgeon: Radiologist, Medication, MD;  Location: [facility] OR;  Service: Radiology;  Laterality: N/A;   RADIOLOGY WITH ANESTHESIA N/A 08/30/2022   Procedure: IR WITH ANESTHESIA;  Surgeon: Julieanne Cotton, MD;  Location: [facility] OR;  Service: Radiology;  Laterality: N/A;   REMOVAL OF IMPELLA LEFT VENTRICULAR ASSIST DEVICE N/A 08/29/2022   Procedure: REMOVAL OF INTRA-AORTIC BALLON PUMP;  Surgeon: Lovett Sox, MD;  Location: [facility] OR;  Service: Open Heart Surgery;  Laterality: N/A;   RIGHT HEART CATH N/A 07/14/2022   Procedure: RIGHT HEART CATH;  Surgeon:  Bensimhon, Bevelyn Buckles, MD;  Location: [facility] INVASIVE CV LAB;  Service: Cardiovascular;  Laterality: N/A;   RIGHT HEART CATH N/A 08/25/2022   Procedure: RIGHT HEART CATH;  Surgeon: Dolores Patty, MD;  Location: [facility] INVASIVE CV LAB;  Service: Cardiovascular;  Laterality: N/A;   RIGHT HEART CATH N/A 09/12/2022   Procedure: RIGHT HEART CATH;  Surgeon: Laurey Morale, MD;  Location: [facility] INVASIVE CV LAB;  Service: Cardiovascular;  Laterality: N/A;   RIGHT/LEFT HEART CATH AND CORONARY ANGIOGRAPHY N/A 12/16/2019   Procedure: RIGHT/LEFT HEART CATH AND CORONARY ANGIOGRAPHY;  Surgeon: Swazil[place], Peter M, MD;  Location: [facility] INVASIVE CV LAB;  Service: Cardiovascular;  Laterality: N/A;   RIGHT/LEFT HEART CATH AND CORONARY ANGIOGRAPHY N/A 08/10/2022   Procedure: RIGHT/LEFT HEART CATH AND CORONARY ANGIOGRAPHY;  Surgeon: Dolores Patty, MD;  Location: [facility] INVASIVE CV LAB;  Service: Cardiovascular;  Laterality: N/A;   TEE WITHOUT CARDIOVERSION N/A 08/29/2022   Procedure: TRANSESOPHAGEAL ECHOCARDIOGRAM;  Surgeon: Lovett Sox, MD;  Location: [facility] OR;  Service: Open Heart Surgery;  Laterality: N/A;   TOOTH EXTRACTION N/A 08/22/2022   Procedure: DENTAL RESTORATION/EXTRACTIONS;  Surgeon: Ocie Doyne, DMD;  Location: [facility] OR;  Service: Oral Surgery;  Laterality: N/A;   Family History: Family History  Problem Relation Age of Onset   Hypertension Father    Heart disease Father    Heart disease Mother    Social History: Social History   Socioeconomic History   Marital status: Divorced    Spouse name: Not on file   Number of children: Not on file   Years of education: Not on file   Highest education level: Not on file  Occupational History   Not on file  Tobacco Use   Smoking status: Former    Types: Cigarettes   Smokeless tobacco: Never  Vaping Use   Vaping status: Unknown  Substance and Sexual Activity   Alcohol use: Yes    Alcohol/week: 6.0 standard drinks of alcohol    Types: 6 Cans of beer per week   Drug use: Yes    Types: Marijuana    Comment: stopped using months ago d/t how it affected breathing   Sexual activity: Yes  Other Topics Concern   Not on file  Social History Narrative   Not on file   Social Drivers of Health   Financial Resource Strain: Low Risk  (07/12/2022)   Overall Financial Resource Strain (CARDIA)    Difficulty of Paying Living Expenses: Not hard at all  Food Insecurity: No Food Insecurity (08/13/2022)   Hunger Vital Sign    Worried About Running Out of Food in the Last Year: Never true    Ran Out of Food in the Last Year: Never true  Transportation Needs: No Transportation Needs (08/13/2022)   PRAPARE - Administrator, Civil Service (Medical): No    Lack of Transportation (Non-Medical): No  Physical Activity: Not on file  Stress: Not on file  Social  Connections: Not on file   Allergies:  Allergies  Allergen Reactions   Pacerone [Amiodarone] Other (See Comments)    Severe tremors   Percocet [Oxycodone-Acetaminophen] Itching   Objective:    Vital Signs:   Weight:  [80 kg] 80 kg (03/31 1458) Last BM Date : 04/16/23 Filed Weights   04/16/23 1458  Weight: 80 kg    Mean arterial Pressure pending  Physical Exam  General:  Well appearing. No resp difficulty HEENT: Normal Neck: supple. JVP ~6.  Cor: Mechanical heart  sounds with LVAD hum present. Lungs: Clear Abdomen: soft, nontender, nondistended. Good bowel sounds. Driveline: C/D/I; securement device intact and driveline incorporated Extremities: no cyanosis, clubbing, rash, edema.  Neuro: alert & oriented x3. Moves all 4 extremities w/o difficulty. Affect pleasant   Telemetry   Not yet on tele  EKG   V paced 71 bpm (Personally reviewed from 03/27/23)    Labs    Basic Metabolic Panel: No results for input(s): "NA", "K", "CL", "CO2", "GLUCOSE", "BUN", "CREATININE", "CALCIUM", "MG", "PHOS" in the last 168 hours.  Liver Function Tests: No results for input(s): "AST", "ALT", "ALKPHOS", "BILITOT", "PROT", "ALBUMIN" in the last 168 hours. No results for input(s): "LIPASE", "AMYLASE" in the last 168 hours. No results for input(s): "AMMONIA" in the last 168 hours.  CBC: No results for input(s): "WBC", "NEUTROABS", "HGB", "HCT", "MCV", "PLT" in the last 168 hours.  Cardiac Enzymes: No results for input(s): "CKTOTAL", "CKMB", "CKMBINDEX", "TROPONINI" in the last 168 hours.  BNP: BNP (last 3 results) Recent Labs    09/11/22 2252 09/18/22 2246 09/25/22 2351  BNP 933.0* 809.0* 851.4*   ProBNP (last 3 results) No results for input(s): "PROBNP" in the last 8760 hours.  CBG: No results for input(s): "GLUCAP" in the last 168 hours.  Coagulation Studies: No results for input(s): "LABPROT", "INR" in the last 72 hours.  Other results:   Imaging    No results  found.    Patient Profile:   [name] is a 58 y.o. 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 He underwent HM- 3 LVAD implant on 08/13 + clipping left atrial appendage. He is being admitted today for pre-op optimization for cystourethroscopy with retrograde pyelogram and stent insertion for gross hematuria.   Assessment/Plan:   Gross hematuria - Patient with gross hematuria over the last 3-4 weeks.  - Check CBC, last Hgb 15.9 3/25 - Per urology, plan for cystourethroscopy with retrograde pyelogram and stent insertion tomorrow - Has been holding Warfarin since Friday  2.  Chronic Systolic HF Hx of Cardiogenic Shock  - Diagnosed 11/2019. Presented with VT. LHC 70% LAD  - cMRI 12/21 concerning for sarcoid and EF 18%.  - PET 2/22 at [facility] EF 25% + active sarcoid - Echo 08/26/20 EF < 20% severely dilated LV RV mildly decreased.  - [employer] CRT-D upgrade in 06/08/21 - Echo 07/10/22: EF <20%, RV okay, mod pericardial effusion, mod MR/TR - Nonobstructive CAD, severely elevated filling pressures and low Fick CO/CI (2.7/1.4) - 08/29/22 HM III LVAD implant + clipping LAA d/t severe thickening and invagination of mitral valve annulus impeding flows.  - Doing well with VAD support. NYHA I  - Volume status appears stable - Continue spiro   3. HM-3 LVAD - VAD interrogated personally. Parameters stable. Does have frequent PI events but no change. No low flows  - DL site ok (on amox) - MAPs look good - LDH pending - INR pending today. Goal 2.0-3.0. 1.6-1.8 prior to procedure.  - ASA 81 mg daily (on for CVA).  - Will hold warfarin with upcoming procedure. Start heparin gtt if INR <1.8   4.  H/o stroke - Admitted 06/24 w/ R MCA stroke. S/p TPA and mechanical clot extraction. No residual deficits. Likely cardioembolic in setting of severe LV dysfunction. - Developed left sided weakness 08/14. CTA with R MCA infarct. Taken to IR for thrombectomy -  Repeat CT head with small to moderate size hemorrhagic conversion.  -  Recovered well with just mild LUE weakness. No change - Continue warfarin (post procedure) /ASA   5. Enterococcus faecalis bacteremia - Bcx 2/2 on 9/6 - ID consult 9/6 -> ampicillin and ceftriaxone - Echo 09/10 - no obvious vegetations - Completed 6 weeks of IV Ampicillin and Ceftriaxone. Now on chronic suppression with Amoxicillin. - DL site looks good today    6. Hx VT - ln setting of potential sarcoid heart disease though core biopsy negative at time iff== - Off amio due to tremor. Continue mexiletine  - now s/p ICD. No recent VT   7. CAD - LHC 12/07/19 70-% LAD, no intervention - LHC 8/24 non obstructive CAD.  - Continue statin. On aspirin for VAD. - No s/s angina   8. Possible cardiac sarcoid - PET 2/22 at [facility] EF 25% + active sarcoid - Has completed prednisone.  - apical core pathology not diagnostic of cardiac sarcoidosis.  - PET negative for sarcoid, Will not restart methotrexate - No change   9. Paroxsymal AT/AF - Currently in NSR - No recent breakthorugh   10. Pulmonary  - PFTs with severe obstructive defect, response to bronchodilator. FEV1 1.04L, FEV1/FVC 48% - stable   I reviewed the LVAD parameters from today, and compared the results to the patient's prior recorded data.  No programming changes were made.  The LVAD is functioning within specified parameters.  The patient performs LVAD self-test daily.  LVAD interrogation was negative for any significant power changes, alarms or PI events/speed drops.  LVAD equipment check completed and is in good working order.  Back-up equipment present.   LVAD education done on emergency procedures and precautions and reviewed exit site care.  Length of Stay: 0  Alen Bleacher, NP 04/16/2023, 3:14 PM  VAD Team Pager [phone] (7am - 7am) +++VAD ISSUES ONLY+++   Advanced Heart Failure Team Pager [phone] (M-F; 7a - 5p)  Please contact [facility]  for night-coverage after hours (5p -7a ) and weekends on [email or web address] for all non- LVAD Issues

## 2023-04-16 NOTE — Plan of Care (Signed)
  Problem: Cardiac: Goal: LVAD will function as expected and patient will experience no clinical alarms Outcome: Progressing   Problem: Education: Goal: Knowledge of General Education information will improve Description: Including pain rating scale, medication(s)/side effects and non-pharmacologic comfort measures Outcome: Progressing   Problem: Health Behavior/Discharge Planning: Goal: Ability to manage health-related needs will improve Outcome: Progressing   Problem: Clinical Measurements: Goal: Ability to maintain clinical measurements within normal limits will improve Outcome: Progressing Goal: Will remain free from infection Outcome: Progressing   Problem: Activity: Goal: Risk for activity intolerance will decrease Outcome: Progressing

## 2023-04-16 NOTE — H&P (View-Only) (Signed)
 CC/HPI: cc: gross hematuria   01/22/23: 58 year old man with a history of VAD currently on Coumadin referred for gross hematuria. Patient has noted intermittent gross hematuria for the last 3 to 4 weeks. He does have urinary frequency and nocturia but is not bothered by symptoms enough to want to try medication. He had a VAD placed in August and was discharged in the hospital in October. I do not see any recent abdominal imaging.   IPSS: 1, not answered, 1, 2, 2, 0, 3= 9/35  4/6 mostly dissatisfied quality of life   03/05/23: Here for cystoscopy as part of hematuria eval. CT without source of hematuria. He is currently on amoxicillin. He continues to have intermittent gross hematuria.     ALLERGIES: amiodarone - Other Reaction, Severe tremors Pacerone TABS    MEDICATIONS: Warfarin Sodium 3 mg tablet  Albuterol Sulfate 0.63 mg/3 ml vial, nebulizer  Amoxicillin 500 mg capsule  Aspirin Regimen 81 mg tablet, delayed release  Atorvastatin Calcium 80 mg tablet  Dorzolamide-Timolol  Fluticasone-Salmeterol Hfa 230 mcg-21 mcg/actuation hfa aerosol with adapter  Latanoprost 0.005 % drops  Mexiletine Hcl 250 mg capsule  Multivitamin  Nystatin 100,000 unit/gram cream  Pantoprazole Sodium 40 mg tablet, delayed release  Sertraline Hcl 50 mg tablet  Sildenafil Citrate 20 mg tablet  Spironolactone 25 mg tablet  Trazodone Hcl 100 mg tablet  Tylenol 325 mg capsule     GU PSH: Locm 300-399Mg /Ml Iodine,1Ml - 02/16/2023       PSH Notes: LVAD 2024   NON-GU PSH: None   GU PMH: Gross hematuria - 02/16/2023, - 01/22/2023 BPH w/LUTS - 01/22/2023 Urinary Frequency - 01/22/2023    NON-GU PMH: Arrhythmia Arthritis GERD Stroke/TIA    FAMILY HISTORY: Blood In Urine - Runs in Family heart failure - Father, Mother Kidney Failure - Mother, Other Kidney Stones - Other, Father stroke - Runs in Family   SOCIAL HISTORY: Marital Status: Divorced Preferred Language: English; Ethnicity: Not Hispanic Or  Latino; Race: White Current Smoking Status: Patient does not smoke anymore.   Tobacco Use Assessment Completed: Used Tobacco in last 30 days? Does not drink anymore.  Drinks 1 caffeinated drink per day.    REVIEW OF SYSTEMS:    GU Review Male:   Patient reports frequent urination, get up at night to urinate, and trouble starting your stream. Patient denies hard to postpone urination, burning/ pain with urination, leakage of urine, stream starts and stops, have to strain to urinate , erection problems, and penile pain.  Gastrointestinal (Upper):   Patient denies nausea, vomiting, and indigestion/ heartburn.  Gastrointestinal (Lower):   Patient denies diarrhea and constipation.  Constitutional:   Patient denies fever, night sweats, weight loss, and fatigue.  Skin:   Patient denies skin rash/ lesion and itching.  Eyes:   Patient denies blurred vision and double vision.  Ears/ Nose/ Throat:   Patient denies sore throat and sinus problems.  Hematologic/Lymphatic:   Patient denies swollen glands and easy bruising.  Cardiovascular:   Patient denies leg swelling and chest pains.  Respiratory:   Patient denies cough and shortness of breath.  Endocrine:   Patient denies excessive thirst.  Musculoskeletal:   Patient denies back pain and joint pain.  Neurological:   Patient denies headaches and dizziness.  Psychologic:   Patient denies depression and anxiety.   Notes: Blood in urine    VITAL SIGNS: None   GU PHYSICAL EXAMINATION:      Notes: left small/mod hydrocele, bilateral descended testes. Circ phallus,  adequate meatus.   MULTI-SYSTEM PHYSICAL EXAMINATION:    Constitutional: Well-nourished. No physical deformities. Normally developed. Good grooming.  Neck: Neck symmetrical, not swollen. Normal tracheal position.  Respiratory: No labored breathing, no use of accessory muscles.   Skin: No paleness, no jaundice, no cyanosis. No lesion, no ulcer, no rash.  Neurologic / Psychiatric:  Oriented to time, oriented to place, oriented to person. No depression, no anxiety, no agitation.  Eyes: Normal conjunctivae. Normal eyelids.  Ears, Nose, Mouth, and Throat: Left ear no scars, no lesions, no masses. Right ear no scars, no lesions, no masses. Nose no scars, no lesions, no masses. Normal hearing. Normal lips.  Musculoskeletal: Normal gait and station of head and neck.     Complexity of Data:  Records Review:   Previous Patient Records, POC Tool  Urine Test Review:   Urinalysis  X-Ray Review: C.T. Abdomen/Pelvis: Reviewed Films. Reviewed Report. Discussed With Patient. IMPRESSION:  1. Small bilateral nonobstructive renal calculi. No ureteral calculi  or hydronephrosis.  2. No urinary tract filling defect on delayed phase imaging.  3. Cholelithiasis.  4. Cardiomegaly with LVAD and ventral abdominal drive line.   Aortic Atherosclerosis (ICD10-I70.0).    Electronically Signed  By: Jearld Lesch M.D.  On: 02/25/2023 15:46      PROCEDURES:         Flexible Cystoscopy - 52000  Risks, benefits, and some of the potential complications of the procedure were discussed at length with the patient including infection, bleeding, voiding discomfort, urinary retention, fever, chills, sepsis, and others. All questions were answered. Informed consent was obtained. Sterile technique and intraurethral analgesia were used.  Meatus:  Normal size. Normal location. Normal condition.  Urethra:  No strictures.  External Sphincter:  Normal.  Verumontanum:  Normal.  Prostate:  Obstructing lateral lobes  Bladder Neck:  Non-obstructing.  Ureteral Orifices:  Normal location. Normal size. Normal shape. Right bloody efflux.  Bladder:  Mild trabeculation. No tumors. Normal mucosa. No stones.      The lower urinary tract was carefully examined. The procedure was well-tolerated and without complications. Antibiotic instructions were given. Instructions were given to call the office immediately for  bloody urine, difficulty urinating, urinary retention, painful or frequent urination, fever, chills, nausea, vomiting or other illness. The patient stated that he understood these instructions and would comply with them.         Urinalysis w/Scope Dipstick Dipstick Cont'd Micro  Color: Brown Bilirubin: Neg mg/dL WBC/hpf: 0 - 5/hpf  Appearance: Cloudy Ketones: Neg mg/dL RBC/hpf: >24/MWN  Specific Gravity: <=1.005 Blood: 3+ ery/uL Bacteria: Mod (26-50/hpf)  pH: 5.5 Protein: 2+ mg/dL Cystals: NS (Not Seen)  Glucose: Neg mg/dL Urobilinogen: 1.0 mg/dL Casts: NS (Not Seen)    Nitrites: Neg Trichomonas: Not Present    Leukocyte Esterase: Trace leu/uL Mucous: Not Present      Epithelial Cells: NS (Not Seen)      Yeast: NS (Not Seen)      Sperm: Not Present    ASSESSMENT:      ICD-10 Details  1 GU:   Gross hematuria - R31.0 Undiagnosed New Problem  2   BPH w/LUTS - N40.1 Chronic, Stable  3   Urinary Frequency - R35.0 Chronic, Stable   PLAN:           Document Letter(s):  Created for Patient: Clinical Summary         Notes:   Gross hematuria:  -Patient with a bloody efflux from right ureteral orifice.  -Typically would recommend  moving forward with diagnostic right ureteroscopy with possible biopsy/laser ablation/stent placement. Given patient's cardiac status and recent LVAD will need to discuss with cardiology whether or not this is a good idea.

## 2023-04-16 NOTE — H&P (Signed)
 CC/HPI: cc: gross hematuria   01/22/23: 58 year old man with a history of VAD currently on Coumadin referred for gross hematuria. Patient has noted intermittent gross hematuria for the last 3 to 4 weeks. He does have urinary frequency and nocturia but is not bothered by symptoms enough to want to try medication. He had a VAD placed in August and was discharged in the hospital in October. I do not see any recent abdominal imaging.   IPSS: 1, not answered, 1, 2, 2, 0, 3= 9/35  4/6 mostly dissatisfied quality of life   03/05/23: Here for cystoscopy as part of hematuria eval. CT without source of hematuria. He is currently on amoxicillin. He continues to have intermittent gross hematuria.     ALLERGIES: amiodarone - Other Reaction, Severe tremors Pacerone TABS    MEDICATIONS: Warfarin Sodium 3 mg tablet  Albuterol Sulfate 0.63 mg/3 ml vial, nebulizer  Amoxicillin 500 mg capsule  Aspirin Regimen 81 mg tablet, delayed release  Atorvastatin Calcium 80 mg tablet  Dorzolamide-Timolol  Fluticasone-Salmeterol Hfa 230 mcg-21 mcg/actuation hfa aerosol with adapter  Latanoprost 0.005 % drops  Mexiletine Hcl 250 mg capsule  Multivitamin  Nystatin 100,000 unit/gram cream  Pantoprazole Sodium 40 mg tablet, delayed release  Sertraline Hcl 50 mg tablet  Sildenafil Citrate 20 mg tablet  Spironolactone 25 mg tablet  Trazodone Hcl 100 mg tablet  Tylenol 325 mg capsule     GU PSH: Locm 300-399Mg /Ml Iodine,1Ml - 02/16/2023       PSH Notes: LVAD 2024   NON-GU PSH: None   GU PMH: Gross hematuria - 02/16/2023, - 01/22/2023 BPH w/LUTS - 01/22/2023 Urinary Frequency - 01/22/2023    NON-GU PMH: Arrhythmia Arthritis GERD Stroke/TIA    FAMILY HISTORY: Blood In Urine - Runs in Family heart failure - Father, Mother Kidney Failure - Mother, Other Kidney Stones - Other, Father stroke - Runs in Family   SOCIAL HISTORY: Marital Status: Divorced Preferred Language: English; Ethnicity: Not Hispanic Or  Latino; Race: White Current Smoking Status: Patient does not smoke anymore.   Tobacco Use Assessment Completed: Used Tobacco in last 30 days? Does not drink anymore.  Drinks 1 caffeinated drink per day.    REVIEW OF SYSTEMS:    GU Review Male:   Patient reports frequent urination, get up at night to urinate, and trouble starting your stream. Patient denies hard to postpone urination, burning/ pain with urination, leakage of urine, stream starts and stops, have to strain to urinate , erection problems, and penile pain.  Gastrointestinal (Upper):   Patient denies nausea, vomiting, and indigestion/ heartburn.  Gastrointestinal (Lower):   Patient denies diarrhea and constipation.  Constitutional:   Patient denies fever, night sweats, weight loss, and fatigue.  Skin:   Patient denies skin rash/ lesion and itching.  Eyes:   Patient denies blurred vision and double vision.  Ears/ Nose/ Throat:   Patient denies sore throat and sinus problems.  Hematologic/Lymphatic:   Patient denies swollen glands and easy bruising.  Cardiovascular:   Patient denies leg swelling and chest pains.  Respiratory:   Patient denies cough and shortness of breath.  Endocrine:   Patient denies excessive thirst.  Musculoskeletal:   Patient denies back pain and joint pain.  Neurological:   Patient denies headaches and dizziness.  Psychologic:   Patient denies depression and anxiety.   Notes: Blood in urine    VITAL SIGNS: None   GU PHYSICAL EXAMINATION:      Notes: left small/mod hydrocele, bilateral descended testes. Circ phallus,  adequate meatus.   MULTI-SYSTEM PHYSICAL EXAMINATION:    Constitutional: Well-nourished. No physical deformities. Normally developed. Good grooming.  Neck: Neck symmetrical, not swollen. Normal tracheal position.  Respiratory: No labored breathing, no use of accessory muscles.   Skin: No paleness, no jaundice, no cyanosis. No lesion, no ulcer, no rash.  Neurologic / Psychiatric:  Oriented to time, oriented to place, oriented to person. No depression, no anxiety, no agitation.  Eyes: Normal conjunctivae. Normal eyelids.  Ears, Nose, Mouth, and Throat: Left ear no scars, no lesions, no masses. Right ear no scars, no lesions, no masses. Nose no scars, no lesions, no masses. Normal hearing. Normal lips.  Musculoskeletal: Normal gait and station of head and neck.     Complexity of Data:  Records Review:   Previous Patient Records, POC Tool  Urine Test Review:   Urinalysis  X-Ray Review: C.T. Abdomen/Pelvis: Reviewed Films. Reviewed Report. Discussed With Patient. IMPRESSION:  1. Small bilateral nonobstructive renal calculi. No ureteral calculi  or hydronephrosis.  2. No urinary tract filling defect on delayed phase imaging.  3. Cholelithiasis.  4. Cardiomegaly with LVAD and ventral abdominal drive line.   Aortic Atherosclerosis (ICD10-I70.0).    Electronically Signed  By: Jearld Lesch M.D.  On: 02/25/2023 15:46      PROCEDURES:         Flexible Cystoscopy - 52000  Risks, benefits, and some of the potential complications of the procedure were discussed at length with the patient including infection, bleeding, voiding discomfort, urinary retention, fever, chills, sepsis, and others. All questions were answered. Informed consent was obtained. Sterile technique and intraurethral analgesia were used.  Meatus:  Normal size. Normal location. Normal condition.  Urethra:  No strictures.  External Sphincter:  Normal.  Verumontanum:  Normal.  Prostate:  Obstructing lateral lobes  Bladder Neck:  Non-obstructing.  Ureteral Orifices:  Normal location. Normal size. Normal shape. Right bloody efflux.  Bladder:  Mild trabeculation. No tumors. Normal mucosa. No stones.      The lower urinary tract was carefully examined. The procedure was well-tolerated and without complications. Antibiotic instructions were given. Instructions were given to call the office immediately for  bloody urine, difficulty urinating, urinary retention, painful or frequent urination, fever, chills, nausea, vomiting or other illness. The patient stated that he understood these instructions and would comply with them.         Urinalysis w/Scope Dipstick Dipstick Cont'd Micro  Color: Brown Bilirubin: Neg mg/dL WBC/hpf: 0 - 5/hpf  Appearance: Cloudy Ketones: Neg mg/dL RBC/hpf: >24/MWN  Specific Gravity: <=1.005 Blood: 3+ ery/uL Bacteria: Mod (26-50/hpf)  pH: 5.5 Protein: 2+ mg/dL Cystals: NS (Not Seen)  Glucose: Neg mg/dL Urobilinogen: 1.0 mg/dL Casts: NS (Not Seen)    Nitrites: Neg Trichomonas: Not Present    Leukocyte Esterase: Trace leu/uL Mucous: Not Present      Epithelial Cells: NS (Not Seen)      Yeast: NS (Not Seen)      Sperm: Not Present    ASSESSMENT:      ICD-10 Details  1 GU:   Gross hematuria - R31.0 Undiagnosed New Problem  2   BPH w/LUTS - N40.1 Chronic, Stable  3   Urinary Frequency - R35.0 Chronic, Stable   PLAN:           Document Letter(s):  Created for Patient: Clinical Summary         Notes:   Gross hematuria:  -Patient with a bloody efflux from right ureteral orifice.  -Typically would recommend  moving forward with diagnostic right ureteroscopy with possible biopsy/laser ablation/stent placement. Given patient's cardiac status and recent LVAD will need to discuss with cardiology whether or not this is a good idea.

## 2023-04-17 ENCOUNTER — Other Ambulatory Visit: Payer: Self-pay

## 2023-04-17 ENCOUNTER — Encounter (HOSPITAL_COMMUNITY)
Admission: RE | Disposition: A | Discharge status: Home / Self Care | Payer: Self-pay | Source: Home / Self Care | Attending: Cardiology

## 2023-04-17 ENCOUNTER — Inpatient Hospital Stay (HOSPITAL_COMMUNITY): Admitting: Anesthesiology

## 2023-04-17 ENCOUNTER — Encounter (HOSPITAL_COMMUNITY): Payer: Self-pay | Admitting: Cardiology

## 2023-04-17 ENCOUNTER — Inpatient Hospital Stay (HOSPITAL_COMMUNITY)

## 2023-04-17 DIAGNOSIS — I2510 Atherosclerotic heart disease of native coronary artery without angina pectoris: Secondary | ICD-10-CM

## 2023-04-17 DIAGNOSIS — Z95811 Presence of heart assist device: Secondary | ICD-10-CM | POA: Diagnosis not present

## 2023-04-17 DIAGNOSIS — R310 Gross hematuria: Secondary | ICD-10-CM

## 2023-04-17 DIAGNOSIS — I509 Heart failure, unspecified: Secondary | ICD-10-CM

## 2023-04-17 DIAGNOSIS — I110 Hypertensive heart disease with heart failure: Secondary | ICD-10-CM

## 2023-04-17 HISTORY — PX: CYSTOSCOPY WITH RETROGRADE PYELOGRAM, URETEROSCOPY AND STENT PLACEMENT: SHX5789

## 2023-04-17 LAB — CBC
HCT: 41.6 % (ref 39.0–52.0)
Hemoglobin: 13.6 g/dL (ref 13.0–17.0)
MCH: 29.4 pg (ref 26.0–34.0)
MCHC: 32.7 g/dL (ref 30.0–36.0)
MCV: 89.8 fL (ref 80.0–100.0)
Platelets: 158 10*3/uL (ref 150–400)
RBC: 4.63 MIL/uL (ref 4.22–5.81)
RDW: 20.9 % — ABNORMAL HIGH (ref 11.5–15.5)
WBC: 7.4 10*3/uL (ref 4.0–10.5)
nRBC: 0 % (ref 0.0–0.2)

## 2023-04-17 LAB — PROTIME-INR
INR: 2.2 — ABNORMAL HIGH (ref 0.8–1.2)
Prothrombin Time: 24.8 s — ABNORMAL HIGH (ref 11.4–15.2)

## 2023-04-17 LAB — BASIC METABOLIC PANEL WITH GFR
Anion gap: 5 (ref 5–15)
BUN: 12 mg/dL (ref 6–20)
CO2: 30 mmol/L (ref 22–32)
Calcium: 9 mg/dL (ref 8.9–10.3)
Chloride: 105 mmol/L (ref 98–111)
Creatinine, Ser: 0.99 mg/dL (ref 0.61–1.24)
GFR, Estimated: >60 mL/min (ref >60)
Glucose, Bld: 82 mg/dL (ref 70–99)
Potassium: 4.1 mmol/L (ref 3.5–5.1)
Sodium: 140 mmol/L (ref 135–145)

## 2023-04-17 LAB — LACTATE DEHYDROGENASE: LDH: 163 U/L (ref 98–192)

## 2023-04-17 SURGERY — CYSTOURETEROSCOPY, WITH RETROGRADE PYELOGRAM AND STENT INSERTION
Anesthesia: General | Site: Bladder | Laterality: Right

## 2023-04-17 MED ORDER — PROPOFOL 10 MG/ML IV BOLUS
INTRAVENOUS | Status: DC | PRN
  Administered 2023-04-17: 80 mg via INTRAVENOUS

## 2023-04-17 MED ORDER — PHENYLEPHRINE HCL-NACL 20-0.9 MG/250ML-% IV SOLN
INTRAVENOUS | Status: DC | PRN
  Administered 2023-04-17: 30 ug/min via INTRAVENOUS

## 2023-04-17 MED ORDER — FENTANYL CITRATE (PF) 100 MCG/2ML IJ SOLN
25.0000 ug | INTRAMUSCULAR | Status: DC | PRN
  Administered 2023-04-17 (×2): 25 ug via INTRAVENOUS

## 2023-04-17 MED ORDER — FENTANYL CITRATE (PF) 250 MCG/5ML IJ SOLN
INTRAMUSCULAR | Status: AC
  Filled 2023-04-17: qty 5

## 2023-04-17 MED ORDER — SODIUM CHLORIDE 0.9 % IR SOLN: 3000.0000 mL | Status: DC

## 2023-04-17 MED ORDER — LACTATED RINGERS IV SOLN: INTRAVENOUS | Status: DC | PRN

## 2023-04-17 MED ORDER — IOHEXOL 300 MG/ML  SOLN
INTRAMUSCULAR | Status: DC | PRN
  Administered 2023-04-17: 9 mL via URETHRAL

## 2023-04-17 MED ORDER — HYDROMORPHONE HCL 2 MG PO TABS
1.0000 mg | ORAL_TABLET | Freq: Three times a day (TID) | ORAL | Status: DC | PRN
  Administered 2023-04-17 – 2023-04-18 (×2): 1 mg via ORAL
  Filled 2023-04-17 (×2): qty 1

## 2023-04-17 MED ORDER — CHLORHEXIDINE GLUCONATE CLOTH 2 % EX PADS
6.0000 | MEDICATED_PAD | Freq: Every day | CUTANEOUS | Status: DC
  Administered 2023-04-17 – 2023-04-22 (×6): 6 via TOPICAL

## 2023-04-17 MED ORDER — 0.9 % SODIUM CHLORIDE (POUR BTL) OPTIME
TOPICAL | Status: DC | PRN
Start: 2023-04-17 — End: 2023-04-17
  Administered 2023-04-17: 1000 mL

## 2023-04-17 MED ORDER — STERILE WATER FOR IRRIGATION IR SOLN
Status: DC | PRN
  Administered 2023-04-17: 1000 mL

## 2023-04-17 MED ORDER — DROPERIDOL 2.5 MG/ML IJ SOLN: 0.6250 mg | Freq: Once | INTRAMUSCULAR | Status: DC | PRN

## 2023-04-17 MED ORDER — ACETAMINOPHEN 500 MG PO TABS
1000.0000 mg | ORAL_TABLET | Freq: Once | ORAL | Status: AC
  Administered 2023-04-17: 1000 mg via ORAL
  Filled 2023-04-17: qty 2

## 2023-04-17 MED ORDER — PHENYLEPHRINE 80 MCG/ML (10ML) SYRINGE FOR IV PUSH (FOR BLOOD PRESSURE SUPPORT)
PREFILLED_SYRINGE | INTRAVENOUS | Status: DC | PRN
  Administered 2023-04-17: 160 ug via INTRAVENOUS

## 2023-04-17 MED ORDER — FENTANYL CITRATE (PF) 100 MCG/2ML IJ SOLN
INTRAMUSCULAR | Status: AC
  Filled 2023-04-17: qty 2

## 2023-04-17 MED ORDER — LACTATED RINGERS IV SOLN: INTRAVENOUS | Status: DC

## 2023-04-17 MED ORDER — CEFAZOLIN SODIUM-DEXTROSE 2-4 GM/100ML-% IV SOLN
2.0000 g | INTRAVENOUS | Status: AC
  Administered 2023-04-17: 2 g via INTRAVENOUS
  Filled 2023-04-17: qty 100

## 2023-04-17 MED ORDER — CHLORHEXIDINE GLUCONATE 0.12 % MT SOLN
15.0000 mL | Freq: Once | OROMUCOSAL | Status: AC
  Administered 2023-04-17: 15 mL via OROMUCOSAL
  Filled 2023-04-17: qty 15

## 2023-04-17 MED ORDER — SODIUM CHLORIDE 0.9 % IR SOLN
Status: DC | PRN
  Administered 2023-04-17: 3000 mL via INTRAVESICAL

## 2023-04-17 MED ORDER — LIDOCAINE HCL URETHRAL/MUCOSAL 2 % EX GEL
CUTANEOUS | Status: AC
  Filled 2023-04-17: qty 11

## 2023-04-17 MED ORDER — ONDANSETRON HCL 4 MG/2ML IJ SOLN
INTRAMUSCULAR | Status: DC | PRN
Start: 2023-04-17 — End: 2023-04-17
  Administered 2023-04-17: 4 mg via INTRAVENOUS

## 2023-04-17 MED ORDER — LIDOCAINE 2% (20 MG/ML) 5 ML SYRINGE
INTRAMUSCULAR | Status: DC | PRN
Start: 2023-04-17 — End: 2023-04-17
  Administered 2023-04-17: 100 mg via INTRAVENOUS

## 2023-04-17 MED ORDER — FENTANYL CITRATE (PF) 250 MCG/5ML IJ SOLN
INTRAMUSCULAR | Status: DC | PRN
  Administered 2023-04-17: 50 ug via INTRAVENOUS

## 2023-04-17 MED ORDER — ORAL CARE MOUTH RINSE: 15.0000 mL | Freq: Once | OROMUCOSAL | Status: AC

## 2023-04-17 SURGICAL SUPPLY — 24 items
BAG DRAIN URO-CYSTO SKYTR STRL (DRAIN) ×2 IMPLANT
BAG URINE DRAIN 2000ML AR STRL (UROLOGICAL SUPPLIES) ×1 IMPLANT
BASKET ZERO TIP NITINOL 2.4FR (BASKET) IMPLANT
BRUSH URET BIOPSY 3F (UROLOGICAL SUPPLIES) ×1 IMPLANT
CATH FOLEY 3WAY 30CC 22FR (CATHETERS) ×1 IMPLANT
CATH URETL OPEN 5X70 (CATHETERS) ×2 IMPLANT
CLOTH BEACON ORANGE TIMEOUT ST (SAFETY) ×2 IMPLANT
DRSG TEGADERM 4X4.75 (GAUZE/BANDAGES/DRESSINGS) IMPLANT
EXTRACTOR STONE 1.7FRX115CM (UROLOGICAL SUPPLIES) IMPLANT
GLOVE BIO SURGEON STRL SZ 6.5 (GLOVE) ×2 IMPLANT
GOWN STRL REUS W/ TWL LRG LVL3 (GOWN DISPOSABLE) ×2 IMPLANT
GUIDEWIRE STR DUAL SENSOR (WIRE) ×3 IMPLANT
KIT TURNOVER KIT B (KITS) ×2 IMPLANT
MANIFOLD NEPTUNE II (INSTRUMENTS) ×2 IMPLANT
NS IRRIG 1000ML POUR BTL (IV SOLUTION) ×1 IMPLANT
PACK CYSTO (CUSTOM PROCEDURE TRAY) ×2 IMPLANT
SET IRRIG Y TYPE TUR BLADDER L (SET/KITS/TRAYS/PACK) ×1 IMPLANT
SLEEVE SCD COMPRESS KNEE MED (STOCKING) ×2 IMPLANT
SOL .9 NS 3000ML IRR UROMATIC (IV SOLUTION) ×2 IMPLANT
STENT URET 6FRX26 CONTOUR (STENTS) ×1 IMPLANT
TRACTIP FLEXIVA PULS ID 200XHI (Laser) IMPLANT
TUBE CONNECTING 12X1/4 (SUCTIONS) ×2 IMPLANT
TUBING UROLOGY SET (TUBING) ×2 IMPLANT
WATER STERILE IRR 1000ML POUR (IV SOLUTION) ×1 IMPLANT

## 2023-04-17 NOTE — Anesthesia Postprocedure Evaluation (Signed)
 Anesthesia Post Note  Patient: HOLLEY WIRT  Procedure(s) Performed: CYSTOSCOPY WITH RETROGRADE PYELOGRAM; DIAGNOSTIC RIGHT URETEROSCOPY, RIGHT URETERAL WASHINGS AND STENT INSERTION (Right: Bladder)     Patient location during evaluation: PACU Anesthesia Type: General Level of consciousness: awake and alert Pain management: pain level controlled Vital Signs Assessment: post-procedure vital signs reviewed and stable Respiratory status: spontaneous breathing, nonlabored ventilation and respiratory function stable Cardiovascular status: blood pressure returned to baseline Postop Assessment: no apparent nausea or vomiting Anesthetic complications: no   There were no known notable events for this encounter.  Last Vitals:  Vitals:   04/17/23 1421 04/17/23 1441  BP:  109/88  Pulse: 79 73  Resp: (!) 9 15  Temp: 36.9 C 37.1 C  SpO2: 92% 92%    Last Pain:  Vitals:   04/17/23 1441  TempSrc: Oral  PainSc: 0-No pain                 Shanda Howells

## 2023-04-17 NOTE — Anesthesia Procedure Notes (Signed)
 Procedure Name: LMA Insertion Date/Time: 04/17/2023 12:21 PM  Performed by: Allyn Kenner, CRNAPre-anesthesia Checklist: Patient identified, Emergency Drugs available, Suction available and Patient being monitored Patient Re-evaluated:Patient Re-evaluated prior to induction Oxygen Delivery Method: Circle System Utilized Preoxygenation: Pre-oxygenation with 100% oxygen Induction Type: IV induction Ventilation: Mask ventilation without difficulty LMA: LMA inserted LMA Size: 4.0 Number of attempts: 1 Airway Equipment and Method: Bite block Placement Confirmation: positive ETCO2 Tube secured with: Tape Dental Injury: Teeth and Oropharynx as per pre-operative assessment

## 2023-04-17 NOTE — Addendum Note (Signed)
 Addended by: Elease Etienne A on: 04/17/2023 01:07 PM   Modules accepted: Orders

## 2023-04-17 NOTE — Progress Notes (Signed)
 LVAD Coordinator Rounding Note:  Admitted 04/16/23 for cystouteroscopy with stent insertion per Dr Arita Miss.  HM 3 LVAD implanted on 08/29/22 by Dr Donata Clay under destination therapy criteria.   Met pt at bedside. Denies complaints. States he is ready for surgery.   See separate note for procedure documentation.   Vital signs: Temp: 98.4 HR: 62 Doppler Pressure: 80 Automatic BP: 101/75 (84) O2 Sat: 99% on RA Wt: 169.8 lbs   LVAD interrogation reveals:  Speed: 5400 Flow: 4.0 Power: 3.9 w PI: 4.4  Alarms: none Events: 30 PI events today  Hematocrit: 32 Fixed speed: 5400 Low speed limit: 5100  Drive Line:  Existing VAD dressing CDI. Anchor secure. Continue weekly dressing changes. Next dressing change due 04/18/23 by bedside RN.  Labs:  LDH trend: 163  INR trend: 2.2  Anticoagulation Plan: -INR Goal: 2.0 - 2.5 -ASA Dose: 81 mg  - Coumadin dosing per pharmacy (on HOLD since 04/13/23)  Blood Products:    Device: Medtronic BiV -Therapies: ON VF ON >207 BPM FVT ON 176-207 BPM VT ON 171-207 BPM   Adverse Events on VAD: - 08/30/22: - Developed left sided weakness this am. CTA with R MCA infarct. Taken to IR for thrombectomy    Plan/Recommendations:  Please page VAD coordinator for any alarms or VAD equipment issues. Weekly drive line dressings per bedside RN  [name] RN VAD Coordinator  Office: [phone]  24/7 Pager: [phone]

## 2023-04-17 NOTE — Progress Notes (Signed)
 PHARMACY - ANTICOAGULATION CONSULT NOTE  Pharmacy Consult for heparin when INR <1.8 Indication:  VAD patient  Allergies  Allergen Reactions   Pacerone [Amiodarone] Other (See Comments)    Severe tremors   Percocet [Oxycodone-Acetaminophen] Itching    Patient Measurements: Height: (P) 5\' 8"  (172.7 cm) Weight: (P) 77 kg (169 lb 12.8 oz) IBW/kg (Calculated) : (P) 68.4 HEPARIN DW (KG): (P) 77  Vital Signs: Temp: 98.2 F (36.8 C) (04/01 1335) Temp Source: (P) Oral (04/01 0926) BP: 111/82 (04/01 1400) Pulse Rate: 41 (04/01 1400)  Labs: Recent Labs    04/16/23 1614 04/17/23 0222  HGB 14.3 13.6  HCT 43.8 41.6  PLT 162 158  LABPROT 26.3* 24.8*  INR 2.4* 2.2*  CREATININE 0.93 0.99    Estimated Creatinine Clearance: 77.7 mL/min (by C-G formula based on SCr of 0.99 mg/dL).   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 possible cardiac sarcoidosis.   Vaccine counseling 11/19/2022   Wide-complex tachycardia 2021   wears LifeVest    Medications:  Medications Prior to Admission  Medication Sig Dispense Refill Last Dose/Taking   amoxicillin (AMOXIL) 500 MG tablet Take 2 tablets (1,000 mg total) by mouth 2 (two) times daily. 360 tablet 3 04/16/2023 Morning   aspirin EC 81 MG tablet Take 1 tablet (81 mg total) by mouth daily. Swallow whole. 120 tablet 0 Past Week   atorvastatin (LIPITOR) 80 MG tablet Take 1 tablet (80 mg total) by mouth daily. 30 tablet 0 04/16/2023  Morning   fluticasone-salmeterol (ADVAIR HFA) 230-21 MCG/ACT inhaler Inhale 2 puffs into the lungs 2 (two) times daily. 1 each 12 04/16/2023 Morning   melatonin 5 MG TABS Take 5 mg by mouth at bedtime.   04/15/2023 Bedtime   mexiletine (MEXITIL) 250 MG capsule TAKE 1 CAPSULE BY MOUTH 2 TIMES DAILY. 180 capsule 3 04/16/2023 Morning   pantoprazole (PROTONIX) 40 MG tablet Take 1 tablet (40 mg total) by mouth daily before breakfast. 30 tablet 0 04/16/2023 Morning   sertraline (ZOLOFT) 50 MG tablet TAKE 1 TABLET BY MOUTH EVERY DAY 90 tablet 3 04/16/2023 Morning   sildenafil (REVATIO) 20 MG tablet Take 1 tablet (20 mg total) by mouth 3 (three) times daily. 90 tablet 11 04/16/2023 Morning   spironolactone (ALDACTONE) 25 MG tablet Take 1 tablet (25 mg total) by mouth daily. 90 tablet 3 04/16/2023 Morning   warfarin (COUMADIN) 3 MG tablet Take 6 mg (2 tabs) every Monday/Wednesday/Friday and 3 mg (1 tab) all other days or as directed by the advanced heart failure clinic (Patient taking differently: Take 3-6 mg by mouth See admin instructions. Take 2 tablets (6mg ) by mouth every Monday, Wednesday and Friday - at 1600. Take 1 tablet (3mg ) by mouth at 1600 on all other days.) 180 tablet 3 04/13/2023   Multiple Vitamin (MULTIVITAMIN WITH MINERALS) TABS tablet Take 1 tablet by mouth daily. (Patient not taking: Reported on 04/16/2023)   Not Taking    Assessment: 58 y.o. M presents  for cystourethroscopy (scheduled 4/1 1045) for hematuria. Pt on warfarin PTA for LVAD. Admission INR 2.4. CBC ok on admission. Home dose: 6mg  M/W/F and 3mg  all other days. Last dose taken 3/28. Held for surgery on 3/29 and 3/30.  INR down to 2.2 today, s/p cystoscopy today.  Now on CBI for ongoing hematuria.  Goal of Therapy:  INR 2-2.5 (per [facility] clinic notes 3/12 2025) Monitor platelets by anticoagulation protocol: Yes   Plan:  Continue to hold warfardin Start heparin when INR <1.8  Reece Leader, Colon Flattery, [facility] Clinical  Pharmacist  04/17/2023 2:14 PM   [facility] pharmacy phone numbers are listed on [email or web address]

## 2023-04-17 NOTE — Op Note (Addendum)
 Operative Note  Preoperative diagnosis:  1.  Gross hematuria  Postoperative diagnosis: 1.  Gross hematuria  Procedure(s): 1.  Cystoscopy 2.  Diagnostic right ureteroscopy 3.  Right retrograde pyelogram 4.  Right ureteral washing 5.  6 French by 26 cm right ureteral stent placement no tether  Surgeon: Kasandra Knudsen, MD  Assistants:  None  Anesthesia:  General  Complications:  None  EBL: Minimal  Specimens: 1. none  Drains/Catheters: 1.  6Fr x 26cm JJ ureteral stent no tether 2.  22 French three-way Foley catheter with continuous bladder irrigation on slow drip  Intraoperative findings:   Normal anterior urethra Prostatic urethra with lateral lobe hypertrophy.  Friable bladder neck that bled easily. Bilateral orthotopic ureteral orifices.  Bloody efflux seen from right ureteral orifice. Normal bladder mucosa without massees  Right retrograde pyelogram demonstrated no filling defects however visualization was obscured from the VAD  Indication:  Charles Holmes is a 58 y.o. male with a VAD currently on Coumadin referred for gross hematuria.  Diagnostic cystoscopy in the office showed bloody efflux from one of the ureteral orifices.  It was difficult to tell which ureteral orifice due to high riding bladder neck and UOs seen best on retroflexion.  Description of procedure:  After risks and benefits of the procedure discussed with the patient, informed consent was obtained.  Patient was admitted preoperatively for optimization of VAD.  His INR this morning was 2.2 however he had held Coumadin for the last 5 days.  He was taken to the operating placed in supine position.  Anesthesia was induced and antibiotics were administered.  The VAD coordinator was present in the operating room to help secure his device.  He was repositioned in the dorsolithotomy position.  The patient was prepped and draped in the usual sterile fashion and a timeout was performed.  A 21 French rigid  cystoscope was placed in the urethra meatus and advanced into the bladder under direct visualization.  Bloody efflux was seen from the right ureteral orifice.  The right ureteral orifice was intubated with a open-ended ureteral catheter and a ureteral washing was obtained.  Next a retrograde pyelogram was performed that showed a normal caliber ureter however some of the ureter was obscured by the VAD device secured to his abdomen.  Next a sensor wire was advanced through the open-ended ureteral catheter and up to the kidney with fluoroscopic guidance.  The open-ended ureteral catheter was removed and the sensor wire secured.  A semirigid ureteroscope was then advanced alongside the wire however due to the amount of bleeding I was unable to visualize the ureteral orifice.  The cystoscope was assembled again and a second wire was advanced alongside the first wire.  The cystoscope was removed and the ureteroscope was run over the second wire and into the distal ureter.  The distal ureter was narrow and I was unable to traverse the ureteroscope further up the ureter.  The ureteroscope was removed as with the second wire.  The wire was backloaded through the cystoscope and a 6 [place] by 26 cm double-J ureteral stent was then placed over the wire with fluoroscopic guidance.  Proximal curl was seen with fluoroscopy and distal curl seen under direct visualization.  Due to the amount of hematuria seen a 22 French hematuria 3-way Foley catheter was then placed and continuous bladder irrigation was initiated on a slow drip.  The patient emerged from anesthesia and was transferred the PACU in stable condition.  Plan:  Transfer back  to cardiology team.  He will need a second procedure to further evaluate the right kidney.  The amount of hematuria seen today would make it difficult to successfully examine his kidney at a later date.  Ideally INR would be less than 2.

## 2023-04-17 NOTE — Progress Notes (Signed)
 VAD Coordinator Procedure Note:    VAD Coordinator met patient in OR. Pt undergoing cysto with stent placement per Dr. Arita Miss. Hemodynamics and VAD parameters monitored by myself and anesthesia throughout the procedure. Blood pressures were obtained with automatic cuff on left arm.    Time: Doppler Auto  BP Flow PI Power Speed  Pre-procedure:  1030  102/90 (96) 3.7 2.8 3.7 5400   1130  95/69 (78)               Sedation Induction: 1220  107/84 (94) 2.9 6.4 3.8 5400   1230  93/80 (85) 3.2 3.0 3.8 5400   1245  92/72 (79) 3.3 2.3 3.8 5400   1300  110/86 (95) 3.6 5.5 4.0 5400   1315  104/90 (95) 3.6 6.1 3.9 5400           Recovery Area: 1330  107/82 (90) 3.3 6.9 4.0 5400   1345   3.6 7.8 3.9 5400   1400  111/82 (92) 3.6 6.7 4.0 5400    Patient Disposition:   Patient tolerated the procedure well. VAD Coordinator accompanied and remained with patient in recovery area.   Foley placed in OR with continuous bladder irrigation due to bleeding from scope trauma. Stent placed in right ureter. Needs < 2 INR for secondary procedure per Dr Arita Miss. See her operative note for further details.   Dr Gasper Lloyd and Anna Genre PA updated on the above.    [name] RN VAD Coordinator  Office: [phone]  24/7 Pager: [phone]

## 2023-04-17 NOTE — Progress Notes (Signed)
 Advanced Heart Failure VAD Team Note  PCP-Cardiologist: Norman Herrlich, MD   Chief Complaint: Gross hematuria  Subjective:    Seen prior to cystoscopy with stent placement. Notes hematuria yesterday evening, improved this morning.  No complaints.  INR 2.2  LVAD INTERROGATION:  HeartMate III LVAD:   Flow 4.3 liters/min, speed 5400, power 3.9, PI 4.1. 30 PI events so far today.    Objective:    Vital Signs:   Temp:  [97.9 F (36.6 C)-98.5 F (36.9 C)] 97.9 F (36.6 C) (04/01 0307) Pulse Rate:  [63] 63 (03/31 1513) Resp:  [15-19] 19 (04/01 0307) BP: (87-102)/(65-78) 87/65 (04/01 0307) SpO2:  [95 %-98 %] 95 % (04/01 0307) Weight:  [77 kg-80 kg] 77 kg (04/01 0500) Last BM Date : 04/16/23 Mean arterial Pressure 70s  Intake/Output:   Intake/Output Summary (Last 24 hours) at 04/17/2023 0754 Last data filed at 04/17/2023 0534 Gross per 24 hour  Intake 120 ml  Output 1275 ml  Net -1155 ml     Physical Exam    General:  Well appearing.  Neck: no JVD Cor: Mechanical heart sounds with LVAD hum present. Lungs: clear Abdomen: soft, nontender, nondistended.  Driveline: C/D/I; securement device intact and driveline incorporated Extremities: no edema Neuro: alert & orientedx3. Affect pleasant   Telemetry   V paced 60s  Labs   Basic Metabolic Panel: Recent Labs  Lab 04/16/23 1614 04/17/23 0222  NA 138 140  K 4.3 4.1  CL 103 105  CO2 28 30  GLUCOSE 71 82  BUN 10 12  CREATININE 0.93 0.99  CALCIUM 8.9 9.0  MG 1.9  --     Liver Function Tests: Recent Labs  Lab 04/16/23 1614  AST 30  ALT 27  ALKPHOS 86  BILITOT 1.5*  PROT 6.2*  ALBUMIN 3.7   No results for input(s): "LIPASE", "AMYLASE" in the last 168 hours. No results for input(s): "AMMONIA" in the last 168 hours.  CBC: Recent Labs  Lab 04/16/23 1614 04/17/23 0222  WBC 7.1 7.4  NEUTROABS 4.8  --   HGB 14.3 13.6  HCT 43.8 41.6  MCV 90.3 89.8  PLT 162 158    INR: Recent Labs  Lab  04/11/23 0000 04/16/23 1614 04/17/23 0222  INR 2.5 2.4* 2.2*    Other results: EKG:    Imaging   No results found.   Medications:     Scheduled Medications:  acetaminophen  1,000 mg Oral Once   amoxicillin  1,000 mg Oral BID   aspirin EC  81 mg Oral Daily   atorvastatin  80 mg Oral Daily   melatonin  5 mg Oral QHS   mexiletine  250 mg Oral BID   multivitamin with minerals  1 tablet Oral Daily   pantoprazole  40 mg Oral QAC breakfast   sildenafil  20 mg Oral TID   spironolactone  25 mg Oral Daily    Infusions:   ceFAZolin (ANCEF) IV      PRN Medications:    Patient Profile   Charles Holmes is a 58 y.o. 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   Admitted for pre-op optimization for cystourethroscopy with retrograde pyelogram and stent insertion for gross hematuria.   Assessment/Plan:    Gross hematuria - Patient with gross hematuria over the last 3-4 weeks.  - Hgb 13.6 this am - Cystourethroscopy with retrograde pyelogram and stent insertion - Has been holding Warfarin  since Friday. INR 2.2 this am   2.  Chronic Systolic HF Hx of Cardiogenic Shock  - Diagnosed 11/2019. Presented with VT. LHC 70% LAD  - cMRI 12/21 concerning for sarcoid and EF 18%.  - PET 2/22 at [facility] EF 25% + active sarcoid - Echo 08/26/20 EF < 20% severely dilated LV RV mildly decreased.  - Medtronic CRT-D upgrade in 06/08/21 - Echo 07/10/22: EF <20%, RV okay, mod pericardial effusion, mod Charles/TR - Nonobstructive CAD, severely elevated filling pressures and low Fick CO/CI (2.7/1.4) - 08/29/22 HM III LVAD implant + clipping LAA d/t severe thickening and invagination of mitral valve annulus impeding flows.  - Doing well with VAD support. NYHA I  - Volume status appears stable - Continue spiro   3. HM-3 LVAD - VAD interrogated personally. Parameters stable. Has frequent PI events, not new. - DL site ok (on amox) - MAPs okay - LDH  stable - INR pending today. Goal 2.0-3.0. INR 2.2 this am, holding warfarin since Friday for planned procedure.  - ASA 81 mg daily (on for CVA).    4.  H/o stroke - Admitted 06/24 w/ R MCA stroke. S/p TPA and mechanical clot extraction. No residual deficits. Likely cardioembolic in setting of severe LV dysfunction. - Developed left sided weakness 08/14. CTA with R MCA infarct. Taken to IR for thrombectomy - Repeat CT head with small to moderate size hemorrhagic conversion.  - Recovered well with just mild LUE weakness. No change - Continue warfarin (post procedure) /ASA   5. Enterococcus faecalis bacteremia - Bcx 2/2 on 9/6 - ID consult 9/6 -> ampicillin and ceftriaxone - Echo 09/10 - no obvious vegetations - Completed 6 weeks of IV Ampicillin and Ceftriaxone. Now on chronic suppression with Amoxicillin. - DL site looks okay   6. Hx VT - ln setting of potential sarcoid heart disease though core biopsy negative at time iff== - Off amio due to tremor. Continue mexiletine  - now s/p ICD. No recent VT   7. CAD - LHC 12/07/19 70-% LAD, no intervention - LHC 8/24 non obstructive CAD.  - Continue statin. On aspirin for VAD. - No s/s angina   8. Possible cardiac sarcoid - PET 2/22 at [facility] EF 25% + active sarcoid - Has completed prednisone.  - apical core pathology not diagnostic of cardiac sarcoidosis.  - PET negative for sarcoid, Will not restart methotrexate - No change   9. Paroxsymal AT/AF - Currently in NSR - No recent breakthorugh   10. Pulmonary  - PFTs with severe obstructive defect, response to bronchodilator. FEV1 1.04L, FEV1/FVC 48% - stable   I reviewed the LVAD parameters from today, and compared the results to the patient's prior recorded data.  No programming changes were made.  The LVAD is functioning within specified parameters.  The patient performs LVAD self-test daily.  LVAD interrogation was negative for any significant power changes, alarms or PI  events/speed drops.  LVAD equipment check completed and is in good working order.  Back-up equipment present.   LVAD education done on emergency procedures and precautions and reviewed exit site care.  Length of Stay: 1  Norie Latendresse N, PA-C 04/17/2023, 7:54 AM  VAD Team --- VAD ISSUES ONLY--- Pager [phone] (7am - 7am)  Advanced Heart Failure Team  Pager [phone] (M-F; 7a - 5p)  Please contact [facility] for night-coverage after hours (5p -7a ) and weekends on [email or web address]

## 2023-04-17 NOTE — Progress Notes (Signed)
 Remote ICD transmission.

## 2023-04-17 NOTE — Interval H&P Note (Signed)
 History and Physical Interval Note:  04/17/2023 11:35 AM  Charles Holmes  has presented today for surgery, with the diagnosis of GROSS HEMATURIA.  The various methods of treatment have been discussed with the patient and family. After consideration of risks, benefits and other options for treatment, the patient has consented to  Procedure(s) with comments: CYSTOURETEROSCOPY, WITH RETROGRADE PYELOGRAM AND STENT INSERTION (Bilateral) - CYSTOSCOPY, BILATERAL RETROGRADE PYELOGRAM, RIGHT DIAGNOSTIC URETEROSCOPY, POSSIBLE BIOPSY, POSSIBLE STENT, POSSIBLE LEFT DIAGNOSTIC URETERSCOPY, POSSIBLE BIOPSY CYSTOSCOPY, WITH BIOPSY (Bilateral) CYSTOSCOPY, FLEXIBLE, WITH STENT REPLACEMENT (Bilateral) as a surgical intervention.  The patient's history has been reviewed, patient examined, no change in status, stable for surgery.  I have reviewed the patient's chart and labs.  Questions were answered to the patient's satisfaction.     Vina Byrd D Kerston Landeck

## 2023-04-17 NOTE — Discharge Summary (Addendum)
 Advanced Heart Failure Team  Discharge Summary   Patient ID: Charles Holmes MRN: [id number], DOB/AGE: 60-Sep-1967 60 y.o. Admit date: 04/16/2023 D/C date:     04/24/2023   Primary Discharge Diagnoses:  Gross hematuria HM III LVAD Chronic systolic CHF  Secondary Discharge Diagnoses:  H/o CVA H/o enterococcus faecalis bacteremia on chronic suppressive antibiotics VT CAD [facility]  Hospital Course:  Charles Holmes is a 58 y.o. male with history of chronic systolic HF d/t NICM s/p CRT-D and later HM III LVAD w/ left arial appendage clipping 08/24, PAF, VT, HTN, hx CVA.    In 08/24 he underwent LVAD implant  + clipping left atrial appendage d/t severe thickening and invagination of mitral valve annulus impeding flows. Post-op course c/b left-sided hemiplegia. CT head 8/14 with acute R MCA infarct. Taken to IR for thrombectomy. F/u head CT with small hemorrhagic conversion. Had some residual deficits w/ mild L sided weakness but otherwise recovered. Course further c/b enterococcus faecalis bacteremia. Completed 6 week course IV abx and now on chronic suppression with amoxicillin.  He has been dealing with recurrent hematuria for 3-4 weeks. Directly admitted on 03/31 for cysto and stent placement on 04/01. INR 2.2 day of procedure despite holding warfarin. Exam difficult d/t significant hematuria. Distal ureter was also narrow and unable to reach right kidney. Stent placed in ureter. Foley then placed and CBI started d/t persistent bleeding. Has been voiding since foley was removed and has had persistent hematuria.   Discussed with Urology yesterday with no changes in plans. Plan to revisit cystoscopy in about 2 weeks (scheduled 05/11/23). Per Urology, there is nothing to do at this time while ureter is being dilated (stent in place) to avoid damaging the vessel prior to actual intervention. Ok to restart anticoagulation with lower INR goal of 1.5-2. Hgb stable throughout admission, 13.6 at discharge.   Plan  for INR check 04/26/22 (has INR machine). AHF PharmD to review and adjust as needed.   Pt will continue to be followed closely in the LVAD/HF clinic. Charles Holmes evaluated and deemed appropriate for discharge.   See below for detailed problem list: Gross hematuria - Gross hematuria over the last 3-4 weeks (on admission).  - 4/1 Underwent Cystourethroscopy with retrograde pyelogram, ureteral washing and stent insertion - Will need repeat procedure at a later date. D/t hematuria R kidney unable to be successfully examined. Completed CBI  - Foley out 4/3. Now using Purewick - Continues to have dark hematuria. Now using urinal or purwick.  - Reached out to urology yesterday. No different plans and ok with starting AC if necessary. Per Charles Holmes second procedure scheduled for 05/11/23 - Hgb 14.8>12.9> 13.5> pending today - AC restarted today with lower INR goal 1.5-2 2.  Chronic Systolic HF Hx of Cardiogenic Shock  - Diagnosed 11/21. Presented with VT. LHC 70% LAD  - cMRI 12/21 concerning for sarcoid and EF 18%.  - PET 2/22 at [facility] EF 25% + active sarcoid - Medtronic CRT-D upgrade in 06/08/21 - Echo 6/24: EF <20%, RV okay, mod pericardial effusion, mod MR/TR - 08/24 HM III LVAD implant + clipping LAA d/t severe thickening and invagination of mitral valve annulus impeding flows. No evidence of sarcoid on biopsy. - Echo 4/3 EF < 20% VAD placement ok RV moderately HK. Septum midline  - Stable NYHA I  - euvolemic today.  3. HM-3 LVAD - VAD interrogated personally. Parameters stable. - DL site ok(on amox) - MAPs ok. LDH 187 - INR 2.3 ->  1.9->>>>>1.1. Holding warfarin due to hematuria. Plan to restart today with lower INR goal.  - Restart aspirin (on for hx CVA) - Echo reviewed Septum midline 4.  H/o stroke - Admitted 06/24 w/ R MCA stroke. S/p TPA and mechanical clot extraction. No residual deficits. Likely cardioembolic in setting of severe LV dysfunction. - Developed left sided weakness 08/14.  CTA with R MCA infarct. Taken to IR for thrombectomy - Repeat CT head with small to moderate size hemorrhagic conversion.  - Recovered well with just mild LUE weakness. No change - Back on ASA  5. Enterococcus faecalis bacteremia - Bcx 2/2 on 9/6 - ID consult 9/6 -> ampicillin and ceftriaxone - Echo 09/10 - no obvious vegetations - Completed 6 weeks of IV Ampicillin and Ceftriaxone. Now on chronic suppression with Amoxicillin. - DL site looks okay 6. Hx VT - ln setting of potential sarcoid heart disease though core biopsy negative at time - Off amio due to tremor. Continue mexiletine  - now s/p ICD. No recent VT 7. CAD - LHC 12/07/19 70-% LAD, no intervention - LHC 8/24 non obstructive CAD.  - Continue statin.  - No s/s angina 8. 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  - apical core pathology not diagnostic of cardiac sarcoidosis.  - PET negative for sarcoid, Will not restart methotrexate - No change 9. Paroxsymal AT/AF: In SR on tele 10. Pulmonary  - PFTs with severe obstructive defect, response to bronchodilator. FEV1 1.04L, FEV1/FVC 48% - stable  LVAD Interrogation HM III: Speed: 5400 Flow: 3.7  PI: 4.5  Power:3.8   Back-up speed: 5100  Discharge Weight Range: 76.3 kg Discharge Vitals: Blood pressure 91/80, pulse 78, temperature 98.3 F (36.8 C), temperature source Oral, resp. rate 18, height (P) 5\' 8"  (1.727 m), weight 76.3 kg, SpO2 97%.  Labs: Lab Results  Component Value Date   WBC 6.2 04/24/2023   HGB 13.6 04/24/2023   HCT 41.4 04/24/2023   MCV 91.4 04/24/2023   PLT 180 04/24/2023    Recent Labs  Lab 04/24/23 0755  NA 140  K 4.3  CL 101  CO2 28  BUN 19  CREATININE 0.92  CALCIUM 9.0  GLUCOSE 79   Lab Results  Component Value Date   CHOL 90 08/11/2022   HDL 26 (L) 08/11/2022   LDLCALC 51 08/11/2022   TRIG 64 08/11/2022   BNP (last 3 results) Recent Labs    09/11/22 2252 09/18/22 2246  09/25/22 2351  BNP 933.0* 809.0* 851.4*    ProBNP (last 3 results) No results for input(s): "PROBNP" in the last 8760 hours.   Diagnostic Studies/Procedures   4/1 Cystourethroscopy with retrograde pyelogram, ureteral washing and stent insertion  Discharge Medications   Allergies as of 04/24/2023       Reactions   Pacerone [amiodarone] Other (See Comments)   Severe tremors   Percocet [oxycodone-acetaminophen] Itching        Medication List     STOP taking these medications    spironolactone 25 MG tablet Commonly known as: ALDACTONE       TAKE these medications    Advair HFA 230-21 MCG/ACT inhaler Generic drug: fluticasone-salmeterol Inhale 2 puffs into the lungs 2 (two) times daily.   amoxicillin 500 MG tablet Commonly known as: AMOXIL Take 2 tablets (1,000 mg total) by mouth 2 (two) times daily.   aspirin EC 81 MG tablet Take 1 tablet (81 mg total) by mouth daily. Swallow  whole.   atorvastatin 80 MG tablet Commonly known as: LIPITOR Take 1 tablet (80 mg total) by mouth daily.   melatonin 5 MG Tabs Take 5 mg by mouth at bedtime.   mexiletine 250 MG capsule Commonly known as: MEXITIL TAKE 1 CAPSULE BY MOUTH 2 TIMES DAILY.   multivitamin with minerals Tabs tablet Take 1 tablet by mouth daily.   pantoprazole 40 MG tablet Commonly known as: PROTONIX Take 1 tablet (40 mg total) by mouth daily before breakfast.   sertraline 50 MG tablet Commonly known as: ZOLOFT TAKE 1 TABLET BY MOUTH EVERY DAY   sildenafil 20 MG tablet Commonly known as: REVATIO Take 1 tablet (20 mg total) by mouth 3 (three) times daily.   warfarin 3 MG tablet Commonly known as: COUMADIN Take as directed. If you are unsure how to take this medication, talk to your nurse or doctor. Original instructions: Take 3 mg (1 tablet) daily or as directed by the advanced heart failure clinic What changed: additional instructions        Disposition   The patient will be discharged in  stable condition to home.   Follow-up Information     [facility] Follow up on 04/23/2023.   Why: 1:00pm Contact information: [address] [place] [address] [phone]        [name], PA-C Follow up.   Specialty: Physician Assistant Why: hospital follow up scheduled for 04/30/2023 at 2 pm. please call to reschedule if you are unable to keep appt Contact information: [address] [place] [address] [phone]                   APP Duration of Discharge Encounter: 16  Signed, Alen Bleacher AGACNP-BC  04/24/2023, 1:15 PM  Patient seen with NP, I formulated the plan and agree with the above note.   Please see my note for earlier today that will serve as my discharge noted.   MD Duration of Discharge Encounter: 32 minutes  Marca Ancona 04/24/2023

## 2023-04-17 NOTE — Transfer of Care (Addendum)
 Immediate Anesthesia Transfer of Care Note  Patient: Charles Holmes  Procedure(s) Performed: Tammi Klippel, WITH RETROGRADE PYELOGRAM AND STENT INSERTION (Bilateral: Bladder) CYSTOSCOPY, WITH BIOPSY (Bilateral: Bladder) CYSTOSCOPY, FLEXIBLE, WITH STENT REPLACEMENT (Bilateral: Bladder)  Patient Location: PACU  Anesthesia Type:General  Level of Consciousness: awake, alert , and oriented  Airway & Oxygen Therapy: Patient Spontanous Breathing and Patient connected to nasal cannula oxygen  Post-op Assessment: Report given to RN and Post -op Vital signs reviewed and stable  Post vital signs: Reviewed and stable  Last Vitals:  Vitals Value Taken Time  BP 125/80   Temp 36.7   Pulse 56   Resp 13 04/17/23 1334  SpO2 100   Vitals shown include unfiled device data.  Last Pain:  Vitals:   04/17/23 0929  TempSrc:   PainSc: 0-No pain         Complications: There were no known notable events for this encounter.

## 2023-04-18 ENCOUNTER — Encounter (HOSPITAL_COMMUNITY): Payer: Self-pay | Admitting: Urology

## 2023-04-18 DIAGNOSIS — R310 Gross hematuria: Secondary | ICD-10-CM | POA: Diagnosis not present

## 2023-04-18 LAB — BASIC METABOLIC PANEL WITH GFR
Anion gap: 7 (ref 5–15)
BUN: 10 mg/dL (ref 6–20)
CO2: 28 mmol/L (ref 22–32)
Calcium: 8.7 mg/dL — ABNORMAL LOW (ref 8.9–10.3)
Chloride: 101 mmol/L (ref 98–111)
Creatinine, Ser: 0.99 mg/dL (ref 0.61–1.24)
GFR, Estimated: >60 mL/min (ref >60)
Glucose, Bld: 77 mg/dL (ref 70–99)
Potassium: 4.3 mmol/L (ref 3.5–5.1)
Sodium: 136 mmol/L (ref 135–145)

## 2023-04-18 LAB — LACTATE DEHYDROGENASE: LDH: 180 U/L (ref 98–192)

## 2023-04-18 LAB — CBC
HCT: 43.5 % (ref 39.0–52.0)
Hemoglobin: 14.5 g/dL (ref 13.0–17.0)
MCH: 30.1 pg (ref 26.0–34.0)
MCHC: 33.3 g/dL (ref 30.0–36.0)
MCV: 90.2 fL (ref 80.0–100.0)
Platelets: 158 10*3/uL (ref 150–400)
RBC: 4.82 MIL/uL (ref 4.22–5.81)
RDW: 19.9 % — ABNORMAL HIGH (ref 11.5–15.5)
WBC: 8.5 10*3/uL (ref 4.0–10.5)
nRBC: 0 % (ref 0.0–0.2)

## 2023-04-18 LAB — CYTOLOGY - NON PAP

## 2023-04-18 LAB — PROTIME-INR
INR: 1.6 — ABNORMAL HIGH (ref 0.8–1.2)
Prothrombin Time: 19.4 s — ABNORMAL HIGH (ref 11.4–15.2)

## 2023-04-18 MED ORDER — WARFARIN - PHARMACIST DOSING INPATIENT: Freq: Every day | Status: DC

## 2023-04-18 MED ORDER — SENNOSIDES-DOCUSATE SODIUM 8.6-50 MG PO TABS
2.0000 | ORAL_TABLET | Freq: Two times a day (BID) | ORAL | Status: DC
  Administered 2023-04-18 – 2023-04-20 (×6): 2 via ORAL
  Filled 2023-04-18 (×11): qty 2

## 2023-04-18 MED ORDER — WARFARIN SODIUM 3 MG PO TABS
6.0000 mg | ORAL_TABLET | Freq: Once | ORAL | Status: AC
  Administered 2023-04-18: 6 mg via ORAL
  Filled 2023-04-18: qty 2

## 2023-04-18 MED ORDER — ACETAMINOPHEN 325 MG PO TABS
650.0000 mg | ORAL_TABLET | Freq: Four times a day (QID) | ORAL | Status: DC
  Administered 2023-04-18 – 2023-04-23 (×18): 650 mg via ORAL
  Filled 2023-04-18 (×22): qty 2

## 2023-04-18 MED ORDER — TRAMADOL HCL 50 MG PO TABS
50.0000 mg | ORAL_TABLET | Freq: Four times a day (QID) | ORAL | Status: DC | PRN
  Administered 2023-04-18 – 2023-04-19 (×5): 100 mg via ORAL
  Administered 2023-04-20: 50 mg via ORAL
  Administered 2023-04-21 – 2023-04-23 (×2): 100 mg via ORAL
  Filled 2023-04-18: qty 1
  Filled 2023-04-18 (×8): qty 2

## 2023-04-18 MED ORDER — HYDROMORPHONE HCL 2 MG PO TABS
1.0000 mg | ORAL_TABLET | Freq: Four times a day (QID) | ORAL | Status: DC | PRN
  Administered 2023-04-18 – 2023-04-21 (×7): 1 mg via ORAL
  Filled 2023-04-18 (×7): qty 1

## 2023-04-18 MED ORDER — POLYETHYLENE GLYCOL 3350 17 G PO PACK
17.0000 g | PACK | Freq: Every day | ORAL | Status: DC
  Administered 2023-04-20: 17 g via ORAL
  Filled 2023-04-18 (×5): qty 1

## 2023-04-18 NOTE — Progress Notes (Signed)
 Advanced Heart Failure VAD Team Note  PCP-Cardiologist: Norman Herrlich, MD   Chief Complaint: Gross hematuria  Subjective:    Underwent Cystourethroscopy with retrograde pyelogram, ureteral washing and stent insertion 04/17/23. Will need repeat procedure at a later date. D/t hematuria R kidney unable to be successfully examined.  Now on CBI  INR 1.6  Feels ok this morning but has been in some pain around his back and kidneys. PRN pain meds help. Has not eaten much as he's afraid to sit up.   LVAD INTERROGATION:  HeartMate III LVAD:   Flow 4.6 liters/min, speed 5400, power 3.9, PI 3.7. 13 PI events today.    Objective:    Vital Signs:   Temp:  [97.9 F (36.6 C)-98.8 F (37.1 C)] 98.5 F (36.9 C) (04/02 0705) Pulse Rate:  [30-81] 81 (04/02 0319) Resp:  [9-19] 14 (04/01 2212) BP: (84-112)/(63-94) 90/69 (04/02 0705) SpO2:  [92 %-99 %] 94 % (04/02 0705) Weight:  [77 kg] (P) 77 kg (04/01 0926) Last BM Date : 04/16/23 Mean arterial Pressure 90s- 100s (elevated 2/2 pain)  Intake/Output:   Intake/Output Summary (Last 24 hours) at 04/18/2023 0708 Last data filed at 04/18/2023 0600 Gross per 24 hour  Intake 6657.83 ml  Output 16109 ml  Net -5767.17 ml     Physical Exam    General:  Well appearing. No resp difficulty Neck: supple. JVP flat Cor: Mechanical heart sounds with LVAD hum present. Lungs: Clear Driveline: C/D/I; securement device intact and driveline incorporated Extremities: no cyanosis, clubbing, rash, edema GU: +foley Neuro: alert & oriented x3. Moves all 4 extremities w/o difficulty. Affect pleasant   Telemetry   V paced 70s (Personally reviewed)    Labs   Basic Metabolic Panel: Recent Labs  Lab 04/16/23 1614 04/17/23 0222 04/18/23 0228  NA 138 140 136  K 4.3 4.1 4.3  CL 103 105 101  CO2 28 30 28   GLUCOSE 71 82 77  BUN 10 12 10   CREATININE 0.93 0.99 0.99  CALCIUM 8.9 9.0 8.7*  MG 1.9  --   --     Liver Function Tests: Recent Labs  Lab  04/16/23 1614  AST 30  ALT 27  ALKPHOS 86  BILITOT 1.5*  PROT 6.2*  ALBUMIN 3.7   No results for input(s): "LIPASE", "AMYLASE" in the last 168 hours. No results for input(s): "AMMONIA" in the last 168 hours.  CBC: Recent Labs  Lab 04/16/23 1614 04/17/23 0222 04/18/23 0228  WBC 7.1 7.4 8.5  NEUTROABS 4.8  --   --   HGB 14.3 13.6 14.5  HCT 43.8 41.6 43.5  MCV 90.3 89.8 90.2  PLT 162 158 158    INR: Recent Labs  Lab 04/16/23 1614 04/17/23 0222 04/18/23 0228  INR 2.4* 2.2* 1.6*    Other results: EKG:    Imaging   DG C-Arm 1-60 Min Result Date: 04/17/2023 CLINICAL DATA:  Cystoureteroscopy EXAM: DG C-ARM 1-60 MIN COMPARISON:  None Available. FINDINGS: Three fluoroscopic images are obtained during the performance of the procedure and are provided for interpretation only. Opacification of the right renal pelvis and proximal right ureter, without evidence of filling defect. Subsequent placement of a right ureteral stent, proximal aspect overlying the right renal pelvis and distal aspect overlying the bladder. Please refer to operative report. Fluoroscopy time: 35 seconds, 10.18 mGy IMPRESSION: 1. Intraoperative evaluation as above. Right ureteral stent placement. Please refer to operative report. Electronically Signed   By: Sharlet Salina M.D.   On: 04/17/2023 17:03  Medications:     Scheduled Medications:  amoxicillin  1,000 mg Oral BID   aspirin EC  81 mg Oral Daily   atorvastatin  80 mg Oral Daily   Chlorhexidine Gluconate Cloth  6 each Topical Q0600   melatonin  5 mg Oral QHS   mexiletine  250 mg Oral BID   multivitamin with minerals  1 tablet Oral Daily   pantoprazole  40 mg Oral QAC breakfast   sildenafil  20 mg Oral TID   spironolactone  25 mg Oral Daily    Infusions:  sodium chloride irrigation      PRN Medications: HYDROmorphone   Patient Profile   Charles Holmes is a 58 y.o. 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   Admitted for pre-op optimization for cystourethroscopy with retrograde pyelogram and stent insertion for gross hematuria.   Assessment/Plan:   Gross hematuria - Patient with gross hematuria over the last 3-4 weeks.  - Underwent Cystourethroscopy with retrograde pyelogram, ureteral washing and stent insertion - Will need repeat procedure at a later date. D/t hematuria R kidney unable to be successfully examined. - Now with CBI - Continue PRN dilaudid for back discomfort - Hgb 14.5 this am - Has been holding Warfarin since Friday 3/28. INR 1.6 this am   2.  Chronic Systolic HF Hx of Cardiogenic Shock  - Diagnosed 11/2019. Presented with VT. LHC 70% LAD  - cMRI 12/21 concerning for sarcoid and EF 18%.  - PET 2/22 at [facility] EF 25% + active sarcoid - Echo 08/26/20 EF < 20% severely dilated LV RV mildly decreased.  - Medtronic CRT-D upgrade in 06/08/21 - Echo 07/10/22: EF <20%, RV okay, mod pericardial effusion, mod Charles/TR - Nonobstructive CAD, severely elevated filling pressures and low Fick CO/CI (2.7/1.4) - 08/29/22 HM III LVAD implant + clipping LAA d/t severe thickening and invagination of mitral valve annulus impeding flows.  - Doing well with VAD support. NYHA I  - Volume status appears stable - Continue spiro   3. HM-3 LVAD - VAD interrogated personally. Parameters stable. Has frequent PI events, not new. - DL site ok (on amox) - MAPs elevated today 2/2 pain. Will continue to follow - LDH stable - INR 1.6 today. Goal 2.0-3.0. Holding warfarin since Friday for planned procedure.  - Start low dose heparin gtt - ASA 81 mg daily (on for CVA).    4.  H/o stroke - Admitted 06/24 w/ R MCA stroke. S/p TPA and mechanical clot extraction. No residual deficits. Likely cardioembolic in setting of severe LV dysfunction. - Developed left sided weakness 08/14. CTA with R MCA infarct. Taken to IR for thrombectomy - Repeat CT head with small to moderate size  hemorrhagic conversion.  - Recovered well with just mild LUE weakness. No change - Continue warfarin (post procedure) /ASA   5. Enterococcus faecalis bacteremia - Bcx 2/2 on 9/6 - ID consult 9/6 -> ampicillin and ceftriaxone - Echo 09/10 - no obvious vegetations - Completed 6 weeks of IV Ampicillin and Ceftriaxone. Now on chronic suppression with Amoxicillin. - DL site looks okay   6. Hx VT - ln setting of potential sarcoid heart disease though core biopsy negative at time iff== - Off amio due to tremor. Continue mexiletine  - now s/p ICD. No recent VT   7. CAD - LHC 12/07/19 70-% LAD, no intervention - LHC 8/24 non obstructive CAD.  - Continue statin. On  aspirin for VAD. - No s/s angina   8. 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  - apical core pathology not diagnostic of cardiac sarcoidosis.  - PET negative for sarcoid, Will not restart methotrexate - No change   9. Paroxsymal AT/AF - Currently in NSR - No recent breakthorugh   10. Pulmonary  - PFTs with severe obstructive defect, response to bronchodilator. FEV1 1.04L, FEV1/FVC 48% - stable   I reviewed the LVAD parameters from today, and compared the results to the patient's prior recorded data.  No programming changes were made.  The LVAD is functioning within specified parameters.  The patient performs LVAD self-test daily.  LVAD interrogation was negative for any significant power changes, alarms or PI events/speed drops.  LVAD equipment check completed and is in good working order.  Back-up equipment present.   LVAD education done on emergency procedures and precautions and reviewed exit site care.  Length of Stay: 2  Alen Bleacher, NP 04/18/2023, 7:08 AM  VAD Team --- VAD ISSUES ONLY--- Pager [phone] (7am - 7am)  Advanced Heart Failure Team  Pager [phone] (M-F; 7a - 5p)  Please contact [facility] for night-coverage after hours (5p -7a ) and weekends  on [email or web address]

## 2023-04-18 NOTE — Progress Notes (Signed)
 PHARMACY - ANTICOAGULATION CONSULT NOTE  Pharmacy Consult for Coumadin Indication:  VAD patient  Allergies  Allergen Reactions   Pacerone [Amiodarone] Other (See Comments)    Severe tremors   Percocet [Oxycodone-Acetaminophen] Itching    Patient Measurements: Height: (P) 5\' 8"  (172.7 cm) Weight: 76.6 kg (168 lb 12.8 oz) IBW/kg (Calculated) : (P) 68.4 HEPARIN DW (KG): (P) 77  Vital Signs: Temp: 98.6 F (37 C) (04/02 1118) Temp Source: Oral (04/02 1118) BP: 90/77 (04/02 1118) Pulse Rate: 80 (04/02 1118)  Labs: Recent Labs    04/16/23 1614 04/17/23 0222 04/18/23 0228  HGB 14.3 13.6 14.5  HCT 43.8 41.6 43.5  PLT 162 158 158  LABPROT 26.3* 24.8* 19.4*  INR 2.4* 2.2* 1.6*  CREATININE 0.93 0.99 0.99    Estimated Creatinine Clearance: 77.7 mL/min (by C-G formula based on SCr of 0.99 mg/dL).   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 possible cardiac sarcoidosis.   Vaccine counseling 11/19/2022   Wide-complex tachycardia 2021   wears LifeVest    Medications:  Medications Prior to Admission  Medication Sig Dispense Refill Last Dose/Taking   amoxicillin (AMOXIL) 500 MG tablet Take 2 tablets (1,000 mg total) by mouth 2 (two) times daily. 360 tablet 3 04/16/2023 Morning   aspirin EC 81 MG tablet Take 1 tablet (81 mg total) by mouth daily. Swallow whole. 120 tablet 0 Past Week   atorvastatin (LIPITOR) 80 MG tablet Take 1 tablet (80 mg total) by mouth daily. 30  tablet 0 04/16/2023 Morning   fluticasone-salmeterol (ADVAIR HFA) 230-21 MCG/ACT inhaler Inhale 2 puffs into the lungs 2 (two) times daily. 1 each 12 04/16/2023 Morning   melatonin 5 MG TABS Take 5 mg by mouth at bedtime.   04/15/2023 Bedtime   mexiletine (MEXITIL) 250 MG capsule TAKE 1 CAPSULE BY MOUTH 2 TIMES DAILY. 180 capsule 3 04/16/2023 Morning   pantoprazole (PROTONIX) 40 MG tablet Take 1 tablet (40 mg total) by mouth daily before breakfast. 30 tablet 0 04/16/2023 Morning   sertraline (ZOLOFT) 50 MG tablet TAKE 1 TABLET BY MOUTH EVERY DAY 90 tablet 3 04/16/2023 Morning   sildenafil (REVATIO) 20 MG tablet Take 1 tablet (20 mg total) by mouth 3 (three) times daily. 90 tablet 11 04/16/2023 Morning   spironolactone (ALDACTONE) 25 MG tablet Take 1 tablet (25 mg total) by mouth daily. 90 tablet 3 04/16/2023 Morning   warfarin (COUMADIN) 3 MG tablet Take 6 mg (2 tabs) every Monday/Wednesday/Friday and 3 mg (1 tab) all other days or as directed by the advanced heart failure clinic (Patient taking differently: Take 3-6 mg by mouth See admin instructions. Take 2 tablets (6mg ) by mouth every Monday, Wednesday and Friday - at 1600. Take 1 tablet (3mg ) by mouth at 1600 on all other days.) 180 tablet 3 04/13/2023   Multiple Vitamin (MULTIVITAMIN WITH MINERALS) TABS tablet Take 1 tablet by mouth daily. (Patient not taking: Reported on 04/16/2023)   Not Taking    Assessment: 58  y.o. Judie Petit presents for cystourethroscopy (scheduled 4/1 1045) for hematuria. Pt on warfarin PTA for LVAD. Admission INR 2.4. CBC ok on admission. Home dose: 6mg  M/W/F and 3mg  all other days. Last dose taken 3/28. Held for surgery on 3/29 and 3/30.  INR down to 1.6 today, still with bloody drainage from CBI.  Per discussion with nephrology, no further interventions planned for at least 2 weeks, okay to resume warfarin per Dr. Gasper Lloyd.  Goal of Therapy:  INR 2-2.5 (per [facility] clinic notes 3/12 2025) Monitor platelets by anticoagulation protocol:  Yes   Plan:  Coumadin 6 mg x1 tonight Daily INR.  Reece Leader, Loura Back, BCPS, BCCP Clinical Pharmacist  04/18/2023 1:12 PM   [facility] pharmacy phone numbers are listed on [email or web address]

## 2023-04-18 NOTE — TOC Initial Note (Signed)
 Transition of Care [facility]) - Initial/Assessment Note    Patient Details  Name: Charles Holmes MRN: [id number] Date of Birth: 10/10/1965  [facility] CM/SW Contact:    Nicanor Bake Phone Number: [phone] 04/18/2023, 10:22 AM  Clinical Narrative:  HF CSW met with patient at bedside. Patient stated that he lives alone. Patient stated that he does not drive and family/friends typically assist with transportation. Patient stated that he has no history of HH services. Patient stated that he does not use any equipment. Patient stated that he has a scale at home. Patient stated that he has a PCP. CSW explained that a hospital follow up will be scheduled closer towards dc. Patient agrees. Patient stated that his daughter will provide transportation at dc.   TOC will continue following.                  Expected Discharge Plan: Home/Self Care Barriers to Discharge: Continued Medical Work up   Patient Goals and CMS Choice            Expected Discharge Plan and Services       Living arrangements for the past 2 months: [place] Home                                      Prior Living Arrangements/Services Living arrangements for the past 2 months: [place] Home Lives with:: Pets, Self Patient language and need for interpreter reviewed:: Yes Do you feel safe going back to the place where you live?: Yes      Need for Family Participation in Patient Care: No (Comment) Care giver support system in place?: Yes (comment)   Criminal Activity/Legal Involvement Pertinent to Current Situation/Hospitalization: No - Comment as needed  Activities of Daily Living   ADL Screening (condition at time of admission) Independently performs ADLs?: Yes (appropriate for developmental age) Is the patient deaf or have difficulty hearing?: No Does the patient have difficulty seeing, even when wearing glasses/contacts?: No Does the patient have difficulty concentrating,  remembering, or making decisions?: No  Permission Sought/Granted                  Emotional Assessment Appearance:: Appears stated age Attitude/Demeanor/Rapport: Engaged Affect (typically observed): Appropriate Orientation: : Oriented to Situation, Oriented to  Time, Oriented to Place, Oriented to Self Alcohol / Substance Use: Not Applicable Psych Involvement: No (comment)  Admission diagnosis:  Gross hematuria [R31.0] Patient Active Problem List   Diagnosis Date Noted   Gross hematuria 04/16/2023   Infection associated with driveline of left ventricular assist device (LVAD) (HCC) 11/19/2022   Enterococcus faecalis infection 11/19/2022   Vaccine counseling 11/19/2022   Hypotension due to drugs 10/10/2022   Acute blood loss anemia 10/10/2022   LVAD (left ventricular assist device) present (HCC) 10/05/2022   Acute right MCA stroke (HCC) 09/27/2022   Bacteremia due to Enterococcus 09/25/2022   Middle cerebral artery embolism, 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   Chronic obstructive pulmonary disease (HCC) 08/23/2022   History of CVA (cerebrovascular accident) 08/11/2022   Paroxysmal atrial fibrillation (HCC) 08/11/2022   Iron deficiency anemia 08/11/2022   CHF (congestive heart failure) (HCC) 08/10/2022   Stroke (cerebrum) (HCC) 07/11/2022   Status post surgery 07/10/2022   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   Coronary artery disease involving native coronary artery of native heart without angina pectoris 03/02/2020   Chronic systolic heart failure (HCC) 02/21/2020  Ventricular tachycardia (HCC) 02/19/2020   Ventricular tachycardia, sustained (HCC) 02/19/2020   AKI (acute kidney injury) (HCC) 12/19/2019   CAD (coronary artery disease) 12/19/2019   Hyperlipidemia with target LDL less than 70 12/19/2019   Wide-complex tachycardia 12/16/2019   Acute on chronic systolic CHF (congestive heart failure) (HCC) 12/16/2019   HTN (hypertension) 12/16/2019   PCP:  [name], PA-C Pharmacy:    [facility] [address] [place] [address] Phone: [phone] Fax: [phone]  [id number] - [place], [place] - [address] AT [facility] [address] [place] [address] Phone: [phone] Fax: [phone]  [id number] [place], [place] - [address] AT [facility] [address] [place] [address] Phone: [phone] Fax: [phone]  [id number] - [place], [place] - [address] [place] [address] Phone: [phone] Fax: [phone]  [facility] [phone] - [place], [place] - [phone] AT [facility] [address] [facility] [address] & [address] [place] [address] Phone: [phone] Fax: [phone]     Social Drivers of Health (SDOH) Social History: SDOH Screenings   Food Insecurity: Food Insecurity Present (04/16/2023)  Housing: Low Risk  (04/16/2023)  Transportation Needs: No Transportation Needs (04/16/2023)  Utilities: Not At Risk (04/16/2023)  Depression (PHQ2-9): Low Risk  (02/26/2023)  Financial Resource Strain: Low Risk  (07/12/2022)  Tobacco Use: Medium Risk (04/17/2023)   SDOH Interventions:     Readmission Risk Interventions     No data to display

## 2023-04-18 NOTE — Plan of Care (Signed)
  Problem: Education: Goal: Patient will understand all VAD equipment and how it functions Outcome: Progressing Goal: Patient will be able to verbalize current INR target range and antiplatelet therapy for discharge home Outcome: Progressing   Problem: Cardiac: Goal: LVAD will function as expected and patient will experience no clinical alarms Outcome: Progressing   Problem: Education: Goal: Knowledge of General Education information will improve Description: Including pain rating scale, medication(s)/side effects and non-pharmacologic comfort measures Outcome: Progressing   Problem: Health Behavior/Discharge Planning: Goal: Ability to manage health-related needs will improve Outcome: Progressing   Problem: Clinical Measurements: Goal: Ability to maintain clinical measurements within normal limits will improve Outcome: Progressing Goal: Will remain free from infection Outcome: Progressing Goal: Diagnostic test results will improve Outcome: Progressing Goal: Respiratory complications will improve Outcome: Progressing Goal: Cardiovascular complication will be avoided Outcome: Progressing   Problem: Activity: Goal: Risk for activity intolerance will decrease Outcome: Progressing   Problem: Nutrition: Goal: Adequate nutrition will be maintained Outcome: Progressing   Problem: Coping: Goal: Level of anxiety will decrease Outcome: Progressing   Problem: Elimination: Goal: Will not experience complications related to bowel motility Outcome: Progressing Goal: Will not experience complications related to urinary retention Outcome: Progressing   Problem: Pain Managment: Goal: General experience of comfort will improve and/or be controlled Outcome: Progressing   Problem: Safety: Goal: Ability to remain free from injury will improve Outcome: Progressing   Problem: Skin Integrity: Goal: Risk for impaired skin integrity will decrease Outcome: Progressing

## 2023-04-18 NOTE — Progress Notes (Signed)
 LVAD Coordinator Rounding Note:  Admitted 04/16/23 for cystouteroscopy with stent insertion per Dr Arita Miss.  HM 3 LVAD implanted on 08/29/22 by Dr Donata Clay under destination therapy criteria.   Cystourethroscopy with retrograde pyelogram, ureteral washing and stent insertion 04/17/23. Need for repeat procedure at a later date d/t hematuria R kidney unable to be successfully examined. CBI ongoing.  Pt lying in bed on my arrival. States he is continuing to have back pain and burning at catheter site. Hematuria noted. Hgb stable at 14.5 this morning. Taking Dilaudid q8h p.o. for pain.  Vital signs: Temp: 98.5 HR: 76 Doppler Pressure: none documented Automatic BP: 90/69 (76) O2 Sat: 94% on RA Wt: 169.8>168.8 lbs   LVAD interrogation reveals:  Speed: 5400 Flow: 4.5 Power: 3.8 w PI: 3.9  Alarms: none Events: 10-20 PI events Hematocrit: 32 Fixed speed: 5400 Low speed limit: 5100  Drive Line:  Existing VAD dressing removed and site care performed using sterile technique. Drive line exit site cleansed with Betadine x 2, allowed to dry, and VASHE. Sorbaview dressing with Silverlon patch applied. Exit site healed and incorporated, the velour is fully implanted at exit site. No redness, tenderness, drainage, foul odor or rash noted. Drive line anchor re-applied. Pt denies fever or chills. Next dressing change 04/25/23 by bedside nurse.  Labs:  Hgb trend:13.6>14.5  LDH trend: 163>180  INR trend: 2.2>1.6  Anticoagulation Plan: -INR Goal: 2.0 - 2.5 -ASA Dose: 81 mg  - Coumadin dosing per pharmacy (on HOLD since 04/13/23)  Blood Products:   Device: Medtronic BiV -Therapies: ON VF ON >207 BPM FVT ON 176-207 BPM VT ON 171-207 BPM  Adverse Events on VAD: - 08/30/22: - Developed left sided weakness this am. CTA with R MCA infarct. Taken to IR for thrombectomy   Plan/Recommendations:  Please page VAD coordinator for any alarms or VAD equipment issues. Weekly drive line dressings per bedside  RN  Simmie Davies RN,BSN VAD Coordinator  Office: [phone]  24/7 Pager: [phone]

## 2023-04-18 NOTE — Progress Notes (Signed)
   1 Day Post-Op Subjective: NAEON. CBI running too fast, irrigant is clear. Clamped today with consideration for voiding trial tomorrow am.   Objective: Vital signs in last 24 hours: Temp:  [98.2 F (36.8 C)-98.8 F (37.1 C)] 98.6 F (37 C) (04/02 1118) Pulse Rate:  [41-81] 80 (04/02 1118) Resp:  [9-17] 14 (04/01 2212) BP: (84-112)/(67-94) 90/77 (04/02 1118) SpO2:  [92 %-99 %] 99 % (04/02 1118) Weight:  [76.6 kg] 76.6 kg (04/02 0705)  Assessment/Plan: #gross hematuria Bloody efflux from right UO.  To the OR with Dr. Arita Miss on 04/17/23 for diagnostic ureteroscopy. Right stent placed. Will need lower INR/controlled bleeding to accurately assess at a later date. Clamped CBI 11:30a 4/2. Voiding trial tomorrow 6am with string of bottles.   Intake/Output from previous day: 04/01 0701 - 04/02 0700 In: 6657.8 [I.V.:557.8; IV Piggyback:100] Out: 16109 [id number]; Blood:50]  Intake/Output this shift: Total I/O In: -  Out: 2250 [Urine:2250]  Physical Exam:  General: Alert and oriented CV: No cyanosis Lungs: equal chest rise Gu: 3way foley in place draining lightly blood tinged irrigant. CBI clamped on rounds  Lab Results: Recent Labs    04/16/23 1614 04/17/23 0222 04/18/23 0228  HGB 14.3 13.6 14.5  HCT 43.8 41.6 43.5   BMET Recent Labs    04/17/23 0222 04/18/23 0228  NA 140 136  K 4.1 4.3  CL 105 101  CO2 30 28  GLUCOSE 82 77  BUN 12 10  CREATININE 0.99 0.99  CALCIUM 9.0 8.7*     Studies/Results: DG C-Arm 1-60 Min Result Date: 04/17/2023 CLINICAL DATA:  Cystoureteroscopy EXAM: DG C-ARM 1-60 MIN COMPARISON:  None Available. FINDINGS: Three fluoroscopic images are obtained during the performance of the procedure and are provided for interpretation only. Opacification of the right renal pelvis and proximal right ureter, without evidence of filling defect. Subsequent placement of a right ureteral stent, proximal aspect overlying the right renal pelvis and distal  aspect overlying the bladder. Please refer to operative report. Fluoroscopy time: 35 seconds, 10.18 mGy IMPRESSION: 1. Intraoperative evaluation as above. Right ureteral stent placement. Please refer to operative report. Electronically Signed   By: Sharlet Salina M.D.   On: 04/17/2023 17:03      LOS: 2 days   Elmon Kirschner, NP [facility] Pager: [phone]  04/18/2023, 11:43 AM

## 2023-04-19 DIAGNOSIS — R310 Gross hematuria: Secondary | ICD-10-CM | POA: Diagnosis not present

## 2023-04-19 DIAGNOSIS — Z7901 Long term (current) use of anticoagulants: Secondary | ICD-10-CM | POA: Diagnosis not present

## 2023-04-19 DIAGNOSIS — Z95811 Presence of heart assist device: Secondary | ICD-10-CM | POA: Diagnosis not present

## 2023-04-19 LAB — CBC
HCT: 45.2 % (ref 39.0–52.0)
Hemoglobin: 15.1 g/dL (ref 13.0–17.0)
MCH: 29.7 pg (ref 26.0–34.0)
MCHC: 33.4 g/dL (ref 30.0–36.0)
MCV: 89 fL (ref 80.0–100.0)
Platelets: 160 10*3/uL (ref 150–400)
RBC: 5.08 MIL/uL (ref 4.22–5.81)
RDW: 19.8 % — ABNORMAL HIGH (ref 11.5–15.5)
WBC: 10.1 10*3/uL (ref 4.0–10.5)
nRBC: 0 % (ref 0.0–0.2)

## 2023-04-19 LAB — PROTIME-INR
INR: 1.7 — ABNORMAL HIGH (ref 0.8–1.2)
Prothrombin Time: 20.4 s — ABNORMAL HIGH (ref 11.4–15.2)

## 2023-04-19 LAB — BASIC METABOLIC PANEL WITH GFR
Anion gap: 7 (ref 5–15)
BUN: 13 mg/dL (ref 6–20)
CO2: 27 mmol/L (ref 22–32)
Calcium: 8.8 mg/dL — ABNORMAL LOW (ref 8.9–10.3)
Chloride: 102 mmol/L (ref 98–111)
Creatinine, Ser: 0.91 mg/dL (ref 0.61–1.24)
GFR, Estimated: >60 mL/min (ref >60)
Glucose, Bld: 115 mg/dL — ABNORMAL HIGH (ref 70–99)
Potassium: 4.1 mmol/L (ref 3.5–5.1)
Sodium: 136 mmol/L (ref 135–145)

## 2023-04-19 LAB — LACTATE DEHYDROGENASE: LDH: 159 U/L (ref 98–192)

## 2023-04-19 LAB — MAGNESIUM: Magnesium: 1.9 mg/dL (ref 1.7–2.4)

## 2023-04-19 MED ORDER — BACITRACIN ZINC 500 UNIT/GM EX OINT
TOPICAL_OINTMENT | Freq: Three times a day (TID) | CUTANEOUS | Status: DC
  Administered 2023-04-19 (×2): 31.5 via TOPICAL
  Filled 2023-04-19 (×2): qty 28.4

## 2023-04-19 MED ORDER — MAGNESIUM SULFATE 2 GM/50ML IV SOLN
2.0000 g | Freq: Once | INTRAVENOUS | Status: AC
  Administered 2023-04-19: 2 g via INTRAVENOUS
  Filled 2023-04-19: qty 50

## 2023-04-19 NOTE — Progress Notes (Signed)
 PHARMACY - ANTICOAGULATION CONSULT NOTE  Pharmacy Consult for Coumadin Indication:  VAD patient  Allergies  Allergen Reactions   Pacerone [Amiodarone] Other (See Comments)    Severe tremors   Percocet [Oxycodone-Acetaminophen] Itching    Patient Measurements: Height: (P) 5\' 8"  (172.7 cm) Weight: 74.7 kg (164 lb 10.9 oz) IBW/kg (Calculated) : (P) 68.4 HEPARIN DW (KG): (P) 77  Vital Signs: Temp: 98.3 F (36.8 C) (04/03 0715) Temp Source: Oral (04/03 0715) BP: 92/80 (04/03 0715) Pulse Rate: 94 (04/03 0715)  Labs: Recent Labs    04/17/23 0222 04/18/23 0228 04/19/23 0236  HGB 13.6 14.5 15.1  HCT 41.6 43.5 45.2  PLT 158 158 160  LABPROT 24.8* 19.4* 20.4*  INR 2.2* 1.6* 1.7*  CREATININE 0.99 0.99 0.91    Estimated Creatinine Clearance: 84.6 mL/min (by C-G formula based on SCr of 0.91 mg/dL).   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 possible cardiac sarcoidosis.   Vaccine counseling 11/19/2022   Wide-complex tachycardia 2021   wears LifeVest    Medications:  Medications Prior to Admission  Medication Sig Dispense Refill Last Dose/Taking   amoxicillin (AMOXIL) 500 MG tablet Take 2 tablets (1,000 mg total) by mouth 2 (two) times daily. 360 tablet 3 04/16/2023 Morning   aspirin EC 81 MG tablet Take 1 tablet (81 mg total) by mouth daily. Swallow whole. 120 tablet 0 Past Week   atorvastatin (LIPITOR) 80 MG tablet Take 1 tablet (80 mg total) by mouth daily. 30  tablet 0 04/16/2023 Morning   fluticasone-salmeterol (ADVAIR HFA) 230-21 MCG/ACT inhaler Inhale 2 puffs into the lungs 2 (two) times daily. 1 each 12 04/16/2023 Morning   melatonin 5 MG TABS Take 5 mg by mouth at bedtime.   04/15/2023 Bedtime   mexiletine (MEXITIL) 250 MG capsule TAKE 1 CAPSULE BY MOUTH 2 TIMES DAILY. 180 capsule 3 04/16/2023 Morning   pantoprazole (PROTONIX) 40 MG tablet Take 1 tablet (40 mg total) by mouth daily before breakfast. 30 tablet 0 04/16/2023 Morning   sertraline (ZOLOFT) 50 MG tablet TAKE 1 TABLET BY MOUTH EVERY DAY 90 tablet 3 04/16/2023 Morning   sildenafil (REVATIO) 20 MG tablet Take 1 tablet (20 mg total) by mouth 3 (three) times daily. 90 tablet 11 04/16/2023 Morning   spironolactone (ALDACTONE) 25 MG tablet Take 1 tablet (25 mg total) by mouth daily. 90 tablet 3 04/16/2023 Morning   warfarin (COUMADIN) 3 MG tablet Take 6 mg (2 tabs) every Monday/Wednesday/Friday and 3 mg (1 tab) all other days or as directed by the advanced heart failure clinic (Patient taking differently: Take 3-6 mg by mouth See admin instructions. Take 2 tablets (6mg ) by mouth every Monday, Wednesday and Friday - at 1600. Take 1 tablet (3mg ) by mouth at 1600 on all other days.) 180 tablet 3 04/13/2023   Multiple Vitamin (MULTIVITAMIN WITH MINERALS) TABS tablet Take 1 tablet by mouth daily. (Patient not taking: Reported on 04/16/2023)   Not Taking    Assessment: 58  y.o. Charles Holmes presents for cystourethroscopy (scheduled 4/1 1045) for hematuria. Pt on warfarin PTA for LVAD. Admission INR 2.4. CBC ok on admission. Home dose: 6mg  M/W/F and 3mg  all other days. Last dose taken 3/28. Held for surgery on 3/29 and 3/30.  INR down to 1.7 today, still with bloody drainage from CBI.  Per discussion with nephrology, will hold coumadin until bleeding slows.  Goal of Therapy:  INR 2-2.5 (per [facility] clinic notes 3/12 2025) Monitor platelets by anticoagulation protocol: Yes   Plan:  Hold Coumadin for now. Will continue to  monitor hematuria.  Reece Leader, Colon Flattery, [facility] Clinical Pharmacist  04/19/2023 11:33 AM   [facility] pharmacy phone numbers are listed on [email or web address]

## 2023-04-19 NOTE — Progress Notes (Signed)
   2 Days Post-Op Subjective: Charles Holmes.  [name] complains of pain in the tip of his penis from his catheter.  CBI clamped overnight. Worsening hematuria, improved HgB.  Reviewed case/plan w/ pt and Heart Failure Team.    Objective: Vital signs in last 24 hours: Temp:  [98.3 F (36.8 C)-98.5 F (36.9 C)] 98.3 F (36.8 C) (04/03 0715) Pulse Rate:  [72-94] 94 (04/03 0715) Resp:  [18-20] 18 (04/03 0715) BP: (91-102)/(57-82) 92/80 (04/03 0715) SpO2:  [94 %-96 %] 94 % (04/03 0251) Weight:  [74.7 kg] 74.7 kg (04/03 0529)  Assessment/Plan: #gross hematuria Bloody efflux from right UO.  To the OR with Dr. Arita Miss on 04/17/23 for diagnostic ureteroscopy. Right stent placed. Will need INR as low as safety allows to accurately assess at a later date. Ureteral stent in place. Will need to stay in for at least two weeks to aid in ureteral dilation/provide access. Schedulers working on next date now.  Pt to have regular H&H monitoring with VAD clinic. Transfuse as necessary. Thankfully his HgB has increased overnight and he has a healthy buffer at over 15 g/dL. He wants foley out d/t penile pain. There is some increased risk of clot obstruction of urine. TOV today.  Ok to discharge from Urologic perspective once passed voiding trial and medically clear.    Intake/Output from previous day: 04/02 0701 - 04/03 0700 In: 940 [P.O.:240] Out: 5000 [Urine:5000]  Intake/Output this shift: Total I/O In: 300 [Other:300] Out: -   Physical Exam:  General: Alert and oriented CV: No cyanosis Lungs: equal chest rise Gu: 3-way foley in place draining clear reddish brown blood tinged irrigant. CBI clamped.  Lab Results: Recent Labs    04/17/23 0222 04/18/23 0228 04/19/23 0236  HGB 13.6 14.5 15.1  HCT 41.6 43.5 45.2   BMET Recent Labs    04/18/23 0228 04/19/23 0236  NA 136 136  K 4.3 4.1  CL 101 102  CO2 28 27  GLUCOSE 77 115*  BUN 10 13  CREATININE 0.99 0.91  CALCIUM 8.7* 8.8*      Studies/Results: DG C-Arm 1-60 Min Result Date: 04/17/2023 CLINICAL DATA:  Cystoureteroscopy EXAM: DG C-ARM 1-60 MIN COMPARISON:  None Available. FINDINGS: Three fluoroscopic images are obtained during the performance of the procedure and are provided for interpretation only. Opacification of the right renal pelvis and proximal right ureter, without evidence of filling defect. Subsequent placement of a right ureteral stent, proximal aspect overlying the right renal pelvis and distal aspect overlying the bladder. Please refer to operative report. Fluoroscopy time: 35 seconds, 10.18 mGy IMPRESSION: 1. Intraoperative evaluation as above. Right ureteral stent placement. Please refer to operative report. Electronically Signed   By: Sharlet Salina M.D.   On: 04/17/2023 17:03      LOS: 3 days   Elmon Kirschner, NP [facility] Pager: [phone]  04/19/2023, 11:40 AM

## 2023-04-19 NOTE — Progress Notes (Signed)
 Urology Inpatient Progress Report     Intv/Subj: No acute events overnight.  CBI clamped last night. Hgb stable but urine still dark.  Principal Problem:   Gross hematuria  Current Facility-Administered Medications  Medication Dose Route Frequency Provider Last Rate Last Admin   acetaminophen (TYLENOL) tablet 650 mg  650 mg Oral Q6H Sabharwal, Aditya, DO   650 mg at 04/19/23 0535   amoxicillin (AMOXIL) capsule 1,000 mg  1,000 mg Oral BID Brynda Peon L, NP   1,000 mg at 04/19/23 0910   atorvastatin (LIPITOR) tablet 80 mg  80 mg Oral Daily Alen Bleacher, NP   80 mg at 04/19/23 4403   bacitracin ointment   Topical TID Wyline Mood, NP       Chlorhexidine Gluconate Cloth 2 % PADS 6 each  6 each Topical Q0600 Noel Christmas, MD   6 each at 04/19/23 0535   HYDROmorphone (DILAUDID) tablet 1 mg  1 mg Oral Q6H PRN Alen Bleacher, NP   1 mg at 04/19/23 0746   magnesium sulfate IVPB 2 g 50 mL  2 g Intravenous Once Sabharwal, Aditya, DO       melatonin tablet 5 mg  5 mg Oral QHS Brynda Peon L, NP   5 mg at 04/18/23 2247   mexiletine (MEXITIL) capsule 250 mg  250 mg Oral BID Alen Bleacher, NP   250 mg at 04/19/23 4742   multivitamin with minerals tablet 1 tablet  1 tablet Oral Daily Alen Bleacher, NP   1 tablet at 04/19/23 0910   pantoprazole (PROTONIX) EC tablet 40 mg  40 mg Oral QAC breakfast Alen Bleacher, NP   40 mg at 04/19/23 0535   polyethylene glycol (MIRALAX / GLYCOLAX) packet 17 g  17 g Oral Daily Sabharwal, Aditya, DO       senna-docusate (Senokot-S) tablet 2 tablet  2 tablet Oral BID Sabharwal, Aditya, DO   2 tablet at 04/19/23 0910   sildenafil (REVATIO) tablet 20 mg  20 mg Oral TID Alen Bleacher, NP   20 mg at 04/19/23 0910   sodium chloride irrigation 0.9 % 3,000 mL  3,000 mL Irrigation Continuous Kasandra Knudsen D, MD       spironolactone (ALDACTONE) tablet 25 mg  25 mg Oral Daily Brynda Peon L, NP   25 mg at 04/19/23 0910   traMADol (ULTRAM) tablet 50-100 mg  50-100 mg Oral Q6H  PRN Sabharwal, Aditya, DO   100 mg at 04/19/23 0535     Objective: Vital: Vitals:   04/18/23 2300 04/19/23 0251 04/19/23 0529 04/19/23 0715  BP: 92/75 (!) 91/57  92/80  Pulse: 79   94  Resp: 18 18  18   Temp: 98.3 F (36.8 C) 98.5 F (36.9 C)  98.3 F (36.8 C)  TempSrc: Oral Oral  Oral  SpO2: 95% 94%    Weight:   74.7 kg   Height:       I/Os: I/O last 3 completed shifts: In: 940 [P.O.:240; Other:700] Out: 59563 [Urine:10700]  Physical Exam:  General: Patient is in no apparent distress Lungs: Normal respiratory effort, chest expands symmetrically. GI: The abdomen is soft and nontender Foley: CBI clamped, dark red thin urine without clot in tubing  Ext: lower extremities symmetric  Lab Results: Recent Labs    04/17/23 0222 04/18/23 0228 04/19/23 0236  WBC 7.4 8.5 10.1  HGB 13.6 14.5 15.1  HCT 41.6 43.5 45.2   Recent Labs    04/17/23 0222  04/18/23 0228 04/19/23 0236  NA 140 136 136  K 4.1 4.3 4.1  CL 105 101 102  CO2 30 28 27   GLUCOSE 82 77 115*  BUN 12 10 13   CREATININE 0.99 0.99 0.91  CALCIUM 9.0 8.7* 8.8*   Recent Labs    04/17/23 0222 04/18/23 0228 04/19/23 0236  INR 2.2* 1.6* 1.7*   No results for input(s): "LABURIN" in the last 72 hours. Results for orders placed or performed during the hospital encounter of 04/16/23  MRSA Next Gen by PCR, Nasal     Status: None   Collection Time: 04/16/23  3:39 PM   Specimen: Nasal Mucosa; Nasal Swab  Result Value Ref Range Status   MRSA by PCR Next Gen NOT DETECTED NOT DETECTED Final    Comment: (NOTE) The GeneXpert MRSA Assay (FDA approved for NASAL specimens only), is one component of a comprehensive MRSA colonization surveillance program. It is not intended to diagnose MRSA infection nor to guide or monitor treatment for MRSA infections. Test performance is not FDA approved in patients less than 70 years old. Performed at [facility], [address] [place], [place] [address]   Surgical PCR  screen     Status: None   Collection Time: 04/16/23  4:51 PM   Specimen: Nasal Mucosa; Nasal Swab  Result Value Ref Range Status   MRSA, PCR NEGATIVE NEGATIVE Final   Staphylococcus aureus NEGATIVE NEGATIVE Final    Comment: (NOTE) The Xpert SA Assay (FDA approved for NASAL specimens in patients 40 years of age and older), is one component of a comprehensive surveillance program. It is not intended to diagnose infection nor to guide or monitor treatment. Performed at [facility], [address], [place], [place] [address]     Studies/Results: DG C-Arm 1-60 Min Result Date: 04/17/2023 CLINICAL DATA:  Cystoureteroscopy EXAM: DG C-ARM 1-60 MIN COMPARISON:  None Available. FINDINGS: Three fluoroscopic images are obtained during the performance of the procedure and are provided for interpretation only. Opacification of the right renal pelvis and proximal right ureter, without evidence of filling defect. Subsequent placement of a right ureteral stent, proximal aspect overlying the right renal pelvis and distal aspect overlying the bladder. Please refer to operative report. Fluoroscopy time: 35 seconds, 10.18 mGy IMPRESSION: 1. Intraoperative evaluation as above. Right ureteral stent placement. Please refer to operative report. Electronically Signed   By: Sharlet Salina M.D.   On: 04/17/2023 17:03    Assessment/Plan: Gross hematuria Bloody efflux right UO -POD2 s/p diagnostic ureteroscopy with stent placement  -voiding trial today -will work on second surgery date in approx 2 weeks    Kasandra Knudsen, MD Urology 04/19/2023, 11:57 AM

## 2023-04-19 NOTE — Progress Notes (Signed)
 Advanced Heart Failure VAD Team Note  PCP-Cardiologist: Norman Herrlich, MD   Chief Complaint: Gross hematuria  Subjective:    Underwent Cystourethroscopy with retrograde pyelogram, ureteral washing and stent insertion 04/17/23. Will need repeat procedure at a later date. D/t hematuria R kidney unable to be successfully examined.    CBI clamped. Continues with hematuria. INR 1.7.  Feels okay other than discomfort from foley  LVAD INTERROGATION:  HeartMate III LVAD:   Flow 4.4 liters/min, speed 5400, power 4.0, PI 2.6. 22 PI events so far today   Objective:    Vital Signs:   Temp:  [98.3 F (36.8 C)-98.6 F (37 C)] 98.3 F (36.8 C) (04/03 0715) Pulse Rate:  [72-94] 94 (04/03 0715) Resp:  [18-20] 18 (04/03 0715) BP: (90-102)/(57-82) 92/80 (04/03 0715) SpO2:  [94 %-99 %] 94 % (04/03 0251) Weight:  [74.7 kg] 74.7 kg (04/03 0529) Last BM Date : 04/17/23 Mean arterial Pressure 80s  Intake/Output:   Intake/Output Summary (Last 24 hours) at 04/19/2023 0749 Last data filed at 04/19/2023 0529 Gross per 24 hour  Intake 940 ml  Output 5000 ml  Net -4060 ml     Physical Exam    Physical Exam: GENERAL: no acute distress. NECK: No JVD CARDIAC:  Mechanical heart sounds with LVAD hum present.  LUNGS:  Clear to auscultation bilaterally.  ABDOMEN:  Soft, round, nontender.     LVAD exit site:   Dressing dry and intact.  Stabilization device present and accurately applied.   GU: + foley, blood tinged urine in bag EXTREMITIES:  Warm and dry, no edema NEUROLOGIC:  Alert and oriented x 4.  Affect pleasant.     Telemetry   V paced 80s  Labs   Basic Metabolic Panel: Recent Labs  Lab 04/16/23 1614 04/17/23 0222 04/18/23 0228 04/19/23 0236  NA 138 140 136 136  K 4.3 4.1 4.3 4.1  CL 103 105 101 102  CO2 28 30 28 27   GLUCOSE 71 82 77 115*  BUN 10 12 10 13   CREATININE 0.93 0.99 0.99 0.91  CALCIUM 8.9 9.0 8.7* 8.8*  MG 1.9  --   --  1.9    Liver Function Tests: Recent Labs   Lab 04/16/23 1614  AST 30  ALT 27  ALKPHOS 86  BILITOT 1.5*  PROT 6.2*  ALBUMIN 3.7   No results for input(s): "LIPASE", "AMYLASE" in the last 168 hours. No results for input(s): "AMMONIA" in the last 168 hours.  CBC: Recent Labs  Lab 04/16/23 1614 04/17/23 0222 04/18/23 0228 04/19/23 0236  WBC 7.1 7.4 8.5 10.1  NEUTROABS 4.8  --   --   --   HGB 14.3 13.6 14.5 15.1  HCT 43.8 41.6 43.5 45.2  MCV 90.3 89.8 90.2 89.0  PLT 162 158 158 160    INR: Recent Labs  Lab 04/16/23 1614 04/17/23 0222 04/18/23 0228 04/19/23 0236  INR 2.4* 2.2* 1.6* 1.7*    Other results: EKG:    Imaging   DG C-Arm 1-60 Min Result Date: 04/17/2023 CLINICAL DATA:  Cystoureteroscopy EXAM: DG C-ARM 1-60 MIN COMPARISON:  None Available. FINDINGS: Three fluoroscopic images are obtained during the performance of the procedure and are provided for interpretation only. Opacification of the right renal pelvis and proximal right ureter, without evidence of filling defect. Subsequent placement of a right ureteral stent, proximal aspect overlying the right renal pelvis and distal aspect overlying the bladder. Please refer to operative report. Fluoroscopy time: 35 seconds, 10.18 mGy IMPRESSION: 1. Intraoperative  evaluation as above. Right ureteral stent placement. Please refer to operative report. Electronically Signed   By: Sharlet Salina M.D.   On: 04/17/2023 17:03     Medications:     Scheduled Medications:  acetaminophen  650 mg Oral Q6H   amoxicillin  1,000 mg Oral BID   aspirin EC  81 mg Oral Daily   atorvastatin  80 mg Oral Daily   Chlorhexidine Gluconate Cloth  6 each Topical Q0600   melatonin  5 mg Oral QHS   mexiletine  250 mg Oral BID   multivitamin with minerals  1 tablet Oral Daily   pantoprazole  40 mg Oral QAC breakfast   polyethylene glycol  17 g Oral Daily   senna-docusate  2 tablet Oral BID   sildenafil  20 mg Oral TID   spironolactone  25 mg Oral Daily   Warfarin - Pharmacist  Dosing Inpatient   Does not apply q1600    Infusions:  sodium chloride irrigation      PRN Medications: HYDROmorphone, traMADol   Patient Profile   Charles Holmes is a 58 y.o. 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   Admitted for pre-op optimization for cystourethroscopy with retrograde pyelogram and stent insertion for gross hematuria.   Assessment/Plan:   Gross hematuria - Patient with gross hematuria over the last 3-4 weeks.  - Underwent Cystourethroscopy with retrograde pyelogram, ureteral washing and stent insertion - Will need repeat procedure at a later date. D/t hematuria R kidney unable to be successfully examined. CBI clamped. Continues with hematuria - Discussed with Urology. Plan to remove foley today. Will need to return at a later date to better evaluate right kidney - Continue PRN dilaudid for back discomfort - Hgb stable at 15 this am. - INR 1.7 this am. Hold warfarin today. Stop aspirin.  - Plan discharge home tomorrow if hematuria improving.   2.  Chronic Systolic HF Hx of Cardiogenic Shock  - Diagnosed 11/2019. Presented with VT. LHC 70% LAD  - cMRI 12/21 concerning for sarcoid and EF 18%.  - PET 2/22 at [facility] EF 25% + active sarcoid - Echo 08/26/20 EF < 20% severely dilated LV RV mildly decreased.  - Medtronic CRT-D upgrade in 06/08/21 - Echo 07/10/22: EF <20%, RV okay, mod pericardial effusion, mod Charles/TR - Nonobstructive CAD, severely elevated filling pressures and low Fick CO/CI (2.7/1.4) - 08/29/22 HM III LVAD implant + clipping LAA d/t severe thickening and invagination of mitral valve annulus impeding flows. No evidence of sarcoid on biopsy. - Doing well with VAD support. NYHA I  - Volume status appears stable - Continue spiro   3. HM-3 LVAD - VAD interrogated personally. Parameters stable. Has frequent PI events, not new. - DL site ok (on amox) - MAPs stable - LDH stable - INR 1.7 today.  Goal 2.0-3.0. Warfarin restarted 04/02. Will hold today.  - Stop aspirin (on for hx CVA)   4.  H/o stroke - Admitted 06/24 w/ R MCA stroke. S/p TPA and mechanical clot extraction. No residual deficits. Likely cardioembolic in setting of severe LV dysfunction. - Developed left sided weakness 08/14. CTA with R MCA infarct. Taken to IR for thrombectomy - Repeat CT head with small to moderate size hemorrhagic conversion.  - Recovered well with just mild LUE weakness. No change - Aspirin/warfarin as above   5. Enterococcus faecalis bacteremia - Bcx 2/2 on 9/6 - ID consult 9/6 ->  ampicillin and ceftriaxone - Echo 09/10 - no obvious vegetations - Completed 6 weeks of IV Ampicillin and Ceftriaxone. Now on chronic suppression with Amoxicillin. - DL site looks okay   6. Hx VT - ln setting of potential sarcoid heart disease though core biopsy negative at time iff== - Off amio due to tremor. Continue mexiletine  - now s/p ICD. No recent VT   7. CAD - LHC 12/07/19 70-% LAD, no intervention - LHC 8/24 non obstructive CAD.  - Continue statin. On aspirin for VAD. - No s/s angina   8. 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  - apical core pathology not diagnostic of cardiac sarcoidosis.  - PET negative for sarcoid, Will not restart methotrexate - No change   9. Paroxsymal AT/AF - Currently in NSR - No recent breakthorugh   10. Pulmonary  - PFTs with severe obstructive defect, response to bronchodilator. FEV1 1.04L, FEV1/FVC 48% - stable   I reviewed the LVAD parameters from today, and compared the results to the patient's prior recorded data.  No programming changes were made.  The LVAD is functioning within specified parameters.  The patient performs LVAD self-test daily.  LVAD interrogation was negative for any significant power changes, alarms or PI events/speed drops.  LVAD equipment check completed and is in good working order.   Back-up equipment present.   LVAD education done on emergency procedures and precautions and reviewed exit site care.  Length of Stay: 3  Dandra Shambaugh, Dalbert Garnet, PA-C 04/19/2023, 7:49 AM  VAD Team --- VAD ISSUES ONLY--- Pager [phone] (7am - 7am)  Advanced Heart Failure Team  Pager [phone] (M-F; 7a - 5p)  Please contact [facility] for night-coverage after hours (5p -7a ) and weekends on [email or web address]

## 2023-04-19 NOTE — Care Management Important Message (Signed)
 Important Message  Patient Details  Name: Charles Holmes MRN: [id number] Date of Birth: 20-Apr-1965   Important Message Given:  Yes - Medicare IM     Dorena Bodo 04/19/2023, 12:13 PM

## 2023-04-19 NOTE — Progress Notes (Signed)
 LVAD Coordinator Rounding Note:  Admitted 04/16/23 for cystouteroscopy with stent insertion per Dr Arita Miss.  HM 3 LVAD implanted on 08/29/22 by Dr Donata Clay under destination therapy criteria.   Cystourethroscopy with retrograde pyelogram, ureteral washing and stent insertion 04/17/23. Need for repeat procedure at a later date d/t hematuria R kidney unable to be successfully examined. CBI stopped yesterday.  Pt lying in bed on my arrival. States he is continuing to have back pain and burning at catheter site. Hematuria noted. Hgb stable at 15.1 this morning. Taking Dilaudid q8h p.o. for pain.  Plan for tentative discharge tomorrow if hematuria improving.   Vital signs: Temp: 98.3 HR: 94 Doppler Pressure: none documented Automatic BP: 92/80 (86) O2 Sat: 94% on RA Wt: 169.8>168.8>164.6 lbs   LVAD interrogation reveals:  Speed: 5400 Flow: 4.5 Power: 4.0 w PI: 3.5  Alarms: none Events: 42 PI events  Hematocrit: 45 Fixed speed: 5400 Low speed limit: 5100  Drive Line:  Existing VAD dressing clean, dry, and intact. Drive line anchor applied. Pt denies fever or chills. Next dressing change 04/25/23 by bedside nurse.  Labs:  Hgb trend:13.6>14.5>15.1  LDH trend: 163>180>159  INR trend: 2.2>1.6>1.7  Anticoagulation Plan: -INR Goal: 2.0 - 2.5 -ASA Dose: 81 mg  (stopped) - Coumadin dosing per pharmacy   Blood Products:   Device: Medtronic BiV -Therapies: ON VF ON >207 BPM FVT ON 176-207 BPM VT ON 171-207 BPM  Adverse Events on VAD: - 08/30/22: - Developed left sided weakness this am. CTA with R MCA infarct. Taken to IR for thrombectomy   Plan/Recommendations:  Please page VAD coordinator for any alarms or VAD equipment issues. Weekly drive line dressings per bedside RN  [name] RN VAD Coordinator  Office: [phone]  24/7 Pager: [phone]

## 2023-04-20 ENCOUNTER — Inpatient Hospital Stay (HOSPITAL_COMMUNITY)

## 2023-04-20 DIAGNOSIS — R310 Gross hematuria: Secondary | ICD-10-CM | POA: Diagnosis not present

## 2023-04-20 DIAGNOSIS — I5022 Chronic systolic (congestive) heart failure: Secondary | ICD-10-CM | POA: Diagnosis not present

## 2023-04-20 LAB — BASIC METABOLIC PANEL WITH GFR
Anion gap: 8 (ref 5–15)
BUN: 16 mg/dL (ref 6–20)
CO2: 29 mmol/L (ref 22–32)
Calcium: 9 mg/dL (ref 8.9–10.3)
Chloride: 100 mmol/L (ref 98–111)
Creatinine, Ser: 1.15 mg/dL (ref 0.61–1.24)
GFR, Estimated: >60 mL/min (ref >60)
Glucose, Bld: 121 mg/dL — ABNORMAL HIGH (ref 70–99)
Potassium: 3.9 mmol/L (ref 3.5–5.1)
Sodium: 137 mmol/L (ref 135–145)

## 2023-04-20 LAB — PROTIME-INR
INR: 2.3 — ABNORMAL HIGH (ref 0.8–1.2)
Prothrombin Time: 25.2 s — ABNORMAL HIGH (ref 11.4–15.2)

## 2023-04-20 LAB — ECHOCARDIOGRAM COMPLETE
Est EF: <20
Height: 68 in
S' Lateral: 5.5 cm
Weight: 2656.1 [oz_av]

## 2023-04-20 LAB — CBC
HCT: 44.6 % (ref 39.0–52.0)
Hemoglobin: 14.8 g/dL (ref 13.0–17.0)
MCH: 30.1 pg (ref 26.0–34.0)
MCHC: 33.2 g/dL (ref 30.0–36.0)
MCV: 90.7 fL (ref 80.0–100.0)
Platelets: 145 10*3/uL — ABNORMAL LOW (ref 150–400)
RBC: 4.92 MIL/uL (ref 4.22–5.81)
RDW: 19.8 % — ABNORMAL HIGH (ref 11.5–15.5)
WBC: 9.5 10*3/uL (ref 4.0–10.5)
nRBC: 0 % (ref 0.0–0.2)

## 2023-04-20 LAB — LACTATE DEHYDROGENASE: LDH: 183 U/L (ref 98–192)

## 2023-04-20 MED ORDER — LACTATED RINGERS IV BOLUS
250.0000 mL | Freq: Once | INTRAVENOUS | Status: AC
  Administered 2023-04-20: 250 mL via INTRAVENOUS

## 2023-04-20 MED ORDER — ONDANSETRON HCL 4 MG/2ML IJ SOLN
4.0000 mg | Freq: Three times a day (TID) | INTRAMUSCULAR | Status: DC | PRN
  Administered 2023-04-21: 4 mg via INTRAVENOUS
  Filled 2023-04-20: qty 2

## 2023-04-20 MED ORDER — SORBITOL 70 % SOLN
30.0000 mL | Freq: Once | Status: AC
  Administered 2023-04-20: 30 mL via ORAL
  Filled 2023-04-20: qty 30

## 2023-04-20 NOTE — Progress Notes (Signed)
 Advanced Heart Failure VAD Team Note  PCP-Cardiologist: Norman Herrlich, MD  Chief Complaint: Gross hematuria Subjective:    4/1: S/p Cystourethroscopy with retrograde pyelogram, ureteral washing and stent insertion. Will need repeat procedure at a later date. D/t hematuria R kidney unable to be successfully examined.    Foley out. Continues with hematuria. INR 2.3 Feeling good this morning. No complaints  LVAD INTERROGATION:  HeartMate III LVAD:   Flow 4.2 liters/min, speed 5500 (set 5400), power 3.9, PI 2.7. Numerous PI events with  Objective:    Vital Signs:   Temp:  [97.6 F (36.4 C)-98.5 F (36.9 C)] 97.6 F (36.4 C) (04/04 0732) Pulse Rate:  [63-84] 63 (04/04 0732) Resp:  [18] 18 (04/04 0732) BP: (92-107)/(48-90) 97/77 (04/04 0732) SpO2:  [91 %-93 %] 93 % (04/04 0732) Weight:  [75.3 kg] 75.3 kg (04/04 0630) Last BM Date : 04/17/23  Intake/Output:   Intake/Output Summary (Last 24 hours) at 04/20/2023 0954 Last data filed at 04/20/2023 0747 Gross per 24 hour  Intake 540 ml  Output 250 ml  Net 290 ml    Physical Exam    General: Well appearing. No distress on RA Cardiac: JVP flat. Mechanical heart sounds with LVAD hum present.  Resp: Lung sounds clear and equal B/L Driveline: Dressing C/D/I. No drainage or redness. Anchor in place. Extremities: Warm and dry. No edema. GU: + gross hemturia Neuro: Alert and oriented x3. Affect pleasant. Moves all extremities without difficulty.  Telemetry   V paced 70 (personally reviewed)  Labs   Basic Metabolic Panel: Recent Labs  Lab 04/16/23 1614 04/17/23 0222 04/18/23 0228 04/19/23 0236 04/20/23 0224  NA 138 140 136 136 137  K 4.3 4.1 4.3 4.1 3.9  CL 103 105 101 102 100  CO2 28 30 28 27 29   GLUCOSE 71 82 77 115* 121*  BUN 10 12 10 13 16   CREATININE 0.93 0.99 0.99 0.91 1.15  CALCIUM 8.9 9.0 8.7* 8.8* 9.0  MG 1.9  --   --  1.9  --    Liver Function Tests: Recent Labs  Lab 04/16/23 1614  AST 30  ALT 27   ALKPHOS 86  BILITOT 1.5*  PROT 6.2*  ALBUMIN 3.7   CBC: Recent Labs  Lab 04/16/23 1614 04/17/23 0222 04/18/23 0228 04/19/23 0236 04/20/23 0224  WBC 7.1 7.4 8.5 10.1 9.5  NEUTROABS 4.8  --   --   --   --   HGB 14.3 13.6 14.5 15.1 14.8  HCT 43.8 41.6 43.5 45.2 44.6  MCV 90.3 89.8 90.2 89.0 90.7  PLT 162 158 158 160 145*   INR: Recent Labs  Lab 04/16/23 1614 04/17/23 0222 04/18/23 0228 04/19/23 0236 04/20/23 0224  INR 2.4* 2.2* 1.6* 1.7* 2.3*   Imaging   No results found.  Medications:    Scheduled Medications:  acetaminophen  650 mg Oral Q6H   amoxicillin  1,000 mg Oral BID   atorvastatin  80 mg Oral Daily   bacitracin   Topical TID   Chlorhexidine Gluconate Cloth  6 each Topical Q0600   melatonin  5 mg Oral QHS   mexiletine  250 mg Oral BID   multivitamin with minerals  1 tablet Oral Daily   pantoprazole  40 mg Oral QAC breakfast   polyethylene glycol  17 g Oral Daily   senna-docusate  2 tablet Oral BID   sildenafil  20 mg Oral TID   spironolactone  25 mg Oral Daily   Infusions:  PRN Medications:  HYDROmorphone, traMADol  Patient Profile   Charles Holmes is a 58 y.o. 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   Admitted for pre-op optimization for cystourethroscopy with retrograde pyelogram and stent insertion for gross hematuria.   Assessment/Plan:    Gross hematuria - Gross hematuria over the last 3-4 weeks.  - Underwent Cystourethroscopy with retrograde pyelogram, ureteral washing and stent insertion - Will need repeat procedure at a later date. D/t hematuria R kidney unable to be successfully examined. CBI clamped.  - Plan to remove foley today. Will need to return at a later date to better evaluate right kidney - Continue PRN dilaudid for back discomfort - Hgb stable   2.  Chronic Systolic HF Hx of Cardiogenic Shock  - Diagnosed 11/21. Presented with VT. LHC 70% LAD  - cMRI 12/21  concerning for sarcoid and EF 18%.  - PET 2/22 at [facility] EF 25% + active sarcoid - Echo 8/22 EF < 20% severely dilated LV RV mildly decreased.  - Medtronic CRT-D upgrade in 06/08/21 - Echo 6/224: EF <20%, RV okay, mod pericardial effusion, mod Charles/TR - Nonobstructive CAD, severely elevated filling pressures and low Fick CO/CI (2.7/1.4) - 08/24 HM III LVAD implant + clipping LAA d/t severe thickening and invagination of mitral valve annulus impeding flows. No evidence of sarcoid on biopsy. - Doing well with VAD support. NYHA I  - Volume status appears stable - Continue spiro 25 mg    3. HM-3 LVAD - VAD interrogated personally. PI events with speed drops, appears dry. Give 250cc LR.  - DL site ok (on amox) - MAPs stable. LDH stable - INR 2.3. Holding warfarin - Stop aspirin (on for hx CVA) - Echo today   4.  H/o stroke - Admitted 06/24 w/ R MCA stroke. S/p TPA and mechanical clot extraction. No residual deficits. Likely cardioembolic in setting of severe LV dysfunction. - Developed left sided weakness 08/14. CTA with R MCA infarct. Taken to IR for thrombectomy - Repeat CT head with small to moderate size hemorrhagic conversion.  - Recovered well with just mild LUE weakness. No change - Off ASA for now, will need to restart.    5. Enterococcus faecalis bacteremia - Bcx 2/2 on 9/6 - ID consult 9/6 -> ampicillin and ceftriaxone - Echo 09/10 - no obvious vegetations - Completed 6 weeks of IV Ampicillin and Ceftriaxone. Now on chronic suppression with Amoxicillin. - DL site looks okay   6. Hx VT - ln setting of potential sarcoid heart disease though core biopsy negative at time iff== - Off amio due to tremor. Continue mexiletine  - now s/p ICD. No recent VT   7. CAD - LHC 12/07/19 70-% LAD, no intervention - LHC 8/24 non obstructive CAD.  - Continue statin.  - No s/s angina   8. 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  -  apical core pathology not diagnostic of cardiac sarcoidosis.  - PET negative for sarcoid, Will not restart methotrexate - No change   9. 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 Pulmonary  - PFTs with severe obstructive defect, response to bronchodilator. FEV1 1.04L, FEV1/FVC 48% - stable  I reviewed the LVAD parameters from today, and compared the results to the patient's prior recorded data.  No programming changes were made.  The LVAD is functioning within specified parameters.  The patient  performs LVAD self-test daily.  LVAD interrogation was negative for any significant power changes, alarms or PI events/speed drops.  LVAD equipment check completed and is in good working order.  Back-up equipment present.   LVAD education done on emergency procedures and precautions and reviewed exit site care.  Length of Stay: 4  [place] Zurich Carreno, NP 04/20/2023, 9:54 AM  VAD Team --- VAD ISSUES ONLY--- Pager [phone] (7am - 7am)  Advanced Heart Failure Team  Pager [phone] (M-F; 7a - 5p)  Please contact [facility] for night-coverage after hours (5p -7a ) and weekends on [email or web address]

## 2023-04-20 NOTE — TOC Progression Note (Signed)
 Transition of Care [facility]) - Progression Note    Patient Details  Name: Charles Holmes MRN: [id number] Date of Birth: 1965/08/13  Transition of Care [facility] CM/SW Contact  Nicanor Bake Phone Number: [phone] 04/20/2023, 2:17 PM  Clinical Narrative:   HF CSW met with patient at bedside. Patient stated that he was doing fine at this time and did not need anything or have any questions. CSW will continue to follow for dc needs.   TOC will continue following.     Expected Discharge Plan: Home/Self Care Barriers to Discharge: Continued Medical Work up  Expected Discharge Plan and Services       Living arrangements for the past 2 months: Mobile Home                                       Social Determinants of Health (SDOH) Interventions SDOH Screenings   Food Insecurity: Food Insecurity Present (04/16/2023)  Housing: Low Risk  (04/16/2023)  Transportation Needs: No Transportation Needs (04/16/2023)  Utilities: Not At Risk (04/16/2023)  Depression (PHQ2-9): Low Risk  (02/26/2023)  Financial Resource Strain: Low Risk  (07/12/2022)  Tobacco Use: Medium Risk (04/17/2023)    Readmission Risk Interventions     No data to display

## 2023-04-20 NOTE — Progress Notes (Addendum)
 LVAD Coordinator Rounding Note:  Admitted 04/16/23 for cystouteroscopy with stent insertion per Dr Arita Miss.  HM 3 LVAD implanted on 08/29/22 by Dr Donata Clay under destination therapy criteria.   Cystourethroscopy with retrograde pyelogram, ureteral washing and stent insertion 04/17/23. Need for repeat procedure at a later date d/t hematuria R kidney unable to be successfully examined. CBI stopped yesterday.  Pt lying in bed on my arrival. States he is continuing to have back pain. Condom cath in place. Hematuria noted. Hgb stable at 14.8 this morning. Taking Dilaudid q8h p.o. for pain.  On interrogation noted to frequently ramp down to low speed limit. Discussed with provider. Will plan for echo today.   Vital signs: Temp: 97.6 HR: 75 Doppler Pressure: 80 Automatic BP: 97/77 (85) O2 Sat: 93% on RA Wt: 169.8>168.8>164.6>166.0 lbs   LVAD interrogation reveals:  Speed: 5400 Flow: 3.9 Power: 3.8 w PI: 3.6  Alarms: none Events: 80+ PI events  Hematocrit: 45 Fixed speed: 5400 Low speed limit: 5100  Drive Line:  Existing VAD dressing clean, dry, and intact. Drive line anchor applied. Pt denies fever or chills. Next dressing change 04/25/23 by bedside nurse.  Labs:  Hgb trend:13.6>14.5>15.1>14.8  LDH trend: 163>180>159>183  INR trend: 2.2>1.6>1.7>2.3  Anticoagulation Plan: -INR Goal: 2.0 - 2.5 -ASA Dose: 81 mg  (stopped) - Coumadin dosing per pharmacy   Blood Products:   Device: Medtronic BiV -Therapies: ON VF ON >207 BPM FVT ON 176-207 BPM VT ON 171-207 BPM  Adverse Events on VAD: - 08/30/22: - Developed left sided weakness this am. CTA with R MCA infarct. Taken to IR for thrombectomy   Plan/Recommendations:  Please page VAD coordinator for any alarms or VAD equipment issues. Weekly drive line dressings per bedside RN  [name] RN VAD Coordinator  Office: [phone]  24/7 Pager: [phone]

## 2023-04-20 NOTE — Progress Notes (Signed)
 PHARMACY - ANTICOAGULATION CONSULT NOTE  Pharmacy Consult for Coumadin Indication:  VAD patient  Allergies  Allergen Reactions   Pacerone [Amiodarone] Other (See Comments)    Severe tremors   Percocet [Oxycodone-Acetaminophen] Itching    Patient Measurements: Height: (P) 5\' 8"  (172.7 cm) Weight: 75.3 kg (166 lb 0.1 oz) IBW/kg (Calculated) : (P) 68.4 HEPARIN DW (KG): (P) 77  Vital Signs: Temp: 98.2 F (36.8 C) (04/04 1141) Temp Source: Oral (04/04 1141) BP: 105/85 (04/04 1141) Pulse Rate: 69 (04/04 1141)  Labs: Recent Labs    04/18/23 0228 04/19/23 0236 04/20/23 0224  HGB 14.5 15.1 14.8  HCT 43.5 45.2 44.6  PLT 158 160 145*  LABPROT 19.4* 20.4* 25.2*  INR 1.6* 1.7* 2.3*  CREATININE 0.99 0.91 1.15    Estimated Creatinine Clearance: 66.9 mL/min (by C-G formula based on SCr of 1.15 mg/dL).   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 possible cardiac sarcoidosis.   Vaccine counseling 11/19/2022   Wide-complex tachycardia 2021   wears LifeVest    Medications:  Medications Prior to Admission  Medication Sig Dispense Refill Last Dose/Taking   amoxicillin (AMOXIL) 500 MG tablet Take 2 tablets (1,000 mg total) by mouth 2 (two) times daily. 360 tablet 3 04/16/2023 Morning   aspirin EC 81 MG tablet Take 1 tablet (81 mg total) by mouth daily. Swallow whole. 120 tablet 0 Past Week   atorvastatin (LIPITOR) 80 MG tablet Take 1 tablet (80 mg total) by mouth daily.  30 tablet 0 04/16/2023 Morning   fluticasone-salmeterol (ADVAIR HFA) 230-21 MCG/ACT inhaler Inhale 2 puffs into the lungs 2 (two) times daily. 1 each 12 04/16/2023 Morning   melatonin 5 MG TABS Take 5 mg by mouth at bedtime.   04/15/2023 Bedtime   mexiletine (MEXITIL) 250 MG capsule TAKE 1 CAPSULE BY MOUTH 2 TIMES DAILY. 180 capsule 3 04/16/2023 Morning   pantoprazole (PROTONIX) 40 MG tablet Take 1 tablet (40 mg total) by mouth daily before breakfast. 30 tablet 0 04/16/2023 Morning   sertraline (ZOLOFT) 50 MG tablet TAKE 1 TABLET BY MOUTH EVERY DAY 90 tablet 3 04/16/2023 Morning   sildenafil (REVATIO) 20 MG tablet Take 1 tablet (20 mg total) by mouth 3 (three) times daily. 90 tablet 11 04/16/2023 Morning   spironolactone (ALDACTONE) 25 MG tablet Take 1 tablet (25 mg total) by mouth daily. 90 tablet 3 04/16/2023 Morning   warfarin (COUMADIN) 3 MG tablet Take 6 mg (2 tabs) every Monday/Wednesday/Friday and 3 mg (1 tab) all other days or as directed by the advanced heart failure clinic (Patient taking differently: Take 3-6 mg by mouth See admin instructions. Take 2 tablets (6mg ) by mouth every Monday, Wednesday and Friday - at 1600. Take 1 tablet (3mg ) by mouth at 1600 on all other days.) 180 tablet 3 04/13/2023   Multiple Vitamin (MULTIVITAMIN WITH MINERALS) TABS tablet Take 1 tablet by mouth daily. (Patient not taking: Reported on 04/16/2023)   Not Taking    Assessment: 58  y.o. Judie Petit presents for cystourethroscopy (scheduled 4/1 1045) for hematuria. Pt on warfarin PTA for LVAD. Admission INR 2.4. CBC ok on admission. Home dose: 6mg  M/W/F and 3mg  all other days.   Per discussion with nephrology, will hold coumadin until bleeding slows. Ongoing hematuria - INR 1.7> 2.3 (s/p 1 dose warfarin 6mg  4/2) - on hold again Holding bASA 4/3 - Hx CVA x2 1pre and 1post LVAD  Goal of Therapy:  INR 2-2.5 (per [facility] clinic notes 3/12 2025) Monitor platelets by anticoagulation protocol: Yes   Plan:  Hold Coumadin for  now. Will continue to monitor hematuria.    Leota Sauers Pharm.D. CPP, BCPS Clinical Pharmacist [phone] 04/20/2023 12:52 PM    [facility] pharmacy phone numbers are listed on [email or web address]

## 2023-04-20 NOTE — Plan of Care (Signed)
 Discussed with patient earlier in the shift plan of care for the shift, pain management and medications with some teach back displayed.  We also discussed his condom catheter and bloody urine which AM MD made aware of per shift report.  Will continue to monitor.  Problem: Education: Goal: Patient will understand all VAD equipment and how it functions Outcome: Progressing   Problem: Education: Goal: Knowledge of General Education information will improve Description: Including pain rating scale, medication(s)/side effects and non-pharmacologic comfort measures Outcome: Progressing   Problem: Health Behavior/Discharge Planning: Goal: Ability to manage health-related needs will improve Outcome: Progressing   Problem: Pain Managment: Goal: General experience of comfort will improve and/or be controlled Outcome: Progressing

## 2023-04-21 DIAGNOSIS — I5022 Chronic systolic (congestive) heart failure: Secondary | ICD-10-CM | POA: Diagnosis not present

## 2023-04-21 DIAGNOSIS — Z95811 Presence of heart assist device: Secondary | ICD-10-CM | POA: Diagnosis not present

## 2023-04-21 DIAGNOSIS — R310 Gross hematuria: Secondary | ICD-10-CM | POA: Diagnosis not present

## 2023-04-21 LAB — CBC
HCT: 45.4 % (ref 39.0–52.0)
Hemoglobin: 14.8 g/dL (ref 13.0–17.0)
MCH: 30.1 pg (ref 26.0–34.0)
MCHC: 32.6 g/dL (ref 30.0–36.0)
MCV: 92.5 fL (ref 80.0–100.0)
Platelets: 173 10*3/uL (ref 150–400)
RBC: 4.91 MIL/uL (ref 4.22–5.81)
RDW: 19.7 % — ABNORMAL HIGH (ref 11.5–15.5)
WBC: 7.6 10*3/uL (ref 4.0–10.5)
nRBC: 0 % (ref 0.0–0.2)

## 2023-04-21 LAB — LACTATE DEHYDROGENASE: LDH: 187 U/L (ref 98–192)

## 2023-04-21 LAB — BASIC METABOLIC PANEL WITH GFR
Anion gap: 8 (ref 5–15)
BUN: 14 mg/dL (ref 6–20)
CO2: 31 mmol/L (ref 22–32)
Calcium: 9.2 mg/dL (ref 8.9–10.3)
Chloride: 99 mmol/L (ref 98–111)
Creatinine, Ser: 1.22 mg/dL (ref 0.61–1.24)
GFR, Estimated: >60 mL/min (ref >60)
Glucose, Bld: 107 mg/dL — ABNORMAL HIGH (ref 70–99)
Potassium: 3.8 mmol/L (ref 3.5–5.1)
Sodium: 138 mmol/L (ref 135–145)

## 2023-04-21 LAB — PROTIME-INR
INR: 1.9 — ABNORMAL HIGH (ref 0.8–1.2)
Prothrombin Time: 21.7 s — ABNORMAL HIGH (ref 11.4–15.2)

## 2023-04-21 MED ORDER — LACTATED RINGERS IV BOLUS
1000.0000 mL | Freq: Once | INTRAVENOUS | Status: AC
Start: 2023-04-21 — End: 2023-04-21
  Administered 2023-04-21: 1000 mL via INTRAVENOUS

## 2023-04-21 NOTE — Plan of Care (Signed)
  Problem: Education: Goal: Patient will understand all VAD equipment and how it functions Outcome: Progressing   Problem: Cardiac: Goal: LVAD will function as expected and patient will experience no clinical alarms Outcome: Progressing   Problem: Education: Goal: Knowledge of General Education information will improve Description: Including pain rating scale, medication(s)/side effects and non-pharmacologic comfort measures Outcome: Progressing   Problem: Clinical Measurements: Goal: Cardiovascular complication will be avoided Outcome: Progressing   Problem: Pain Managment: Goal: General experience of comfort will improve and/or be controlled Outcome: Progressing

## 2023-04-21 NOTE — Progress Notes (Signed)
 Advanced Heart Failure VAD Team Note  PCP-Cardiologist: Norman Herrlich, MD  Chief Complaint: Gross hematuria Subjective:    4/1: S/p Cystourethroscopy with retrograde pyelogram, ureteral washing and stent insertion. Will need repeat procedure at a later date. D/t hematuria R kidney unable to be successfully examined.    Foley out. Said he had a rough night.. Condom cath wasn't working. Passing clots. Now improved with Purewick.   Holding warfarin due to hematuria. INR 2.3 -> 1.9   MAPs ok Numerous PI events/ Poor po intake   Echo 4/3 EF < 20% VAD placement ok RV moderately HK. Septum midline  LVAD INTERROGATION:  HeartMate III LVAD:   Flow 4.2 liters/min, speed 5450, power 4.0, PI 4.5  Numerous PI eventsVAD interrogated personally.   Objective:    Vital Signs:   Temp:  [98.2 F (36.8 C)-98.6 F (37 C)] 98.6 F (37 C) (04/05 0738) Pulse Rate:  [69-88] 88 (04/05 0738) Resp:  [15-18] 15 (04/05 0738) BP: (85-105)/(60-85) 85/60 (04/05 0738) SpO2:  [94 %-95 %] 94 % (04/05 0738) Weight:  [76.6 kg] 76.6 kg (04/05 0500) Last BM Date : 04/20/23  Intake/Output:   Intake/Output Summary (Last 24 hours) at 04/21/2023 0915 Last data filed at 04/21/2023 0739 Gross per 24 hour  Intake 480 ml  Output 2275 ml  Net -1795 ml    Physical Exam    General:  NAD.  HEENT: normal  Neck: supple. JVP not elevated.  Carotids 2+ bilat; no bruits. No lymphadenopathy or thryomegaly appreciated. Cor: LVAD hum.  Lungs: Clear. Abdomen: soft, nontender, non-distended. No hepatosplenomegaly. No bruits or masses. Good bowel sounds. Driveline site clean. Anchor in place.  Extremities: no cyanosis, clubbing, rash. Warm no edema  + Purewick with persistent dark hematuria Neuro: alert & oriented x 3. No focal deficits. Moves all 4 without problem    Telemetry   V paced 70 Personally reviewed  Labs   Basic Metabolic Panel: Recent Labs  Lab 04/16/23 1614 04/17/23 0222 04/18/23 0228 04/19/23 0236  04/20/23 0224 04/21/23 0218  NA 138 140 136 136 137 138  K 4.3 4.1 4.3 4.1 3.9 3.8  CL 103 105 101 102 100 99  CO2 28 30 28 27 29 31   GLUCOSE 71 82 77 115* 121* 107*  BUN 10 12 10 13 16 14   CREATININE 0.93 0.99 0.99 0.91 1.15 1.22  CALCIUM 8.9 9.0 8.7* 8.8* 9.0 9.2  MG 1.9  --   --  1.9  --   --    Liver Function Tests: Recent Labs  Lab 04/16/23 1614  AST 30  ALT 27  ALKPHOS 86  BILITOT 1.5*  PROT 6.2*  ALBUMIN 3.7   CBC: Recent Labs  Lab 04/16/23 1614 04/17/23 0222 04/18/23 0228 04/19/23 0236 04/20/23 0224 04/21/23 0218  WBC 7.1 7.4 8.5 10.1 9.5 7.6  NEUTROABS 4.8  --   --   --   --   --   HGB 14.3 13.6 14.5 15.1 14.8 14.8  HCT 43.8 41.6 43.5 45.2 44.6 45.4  MCV 90.3 89.8 90.2 89.0 90.7 92.5  PLT 162 158 158 160 145* 173   INR: Recent Labs  Lab 04/17/23 0222 04/18/23 0228 04/19/23 0236 04/20/23 0224 04/21/23 0218  INR 2.2* 1.6* 1.7* 2.3* 1.9*   Imaging   ECHOCARDIOGRAM COMPLETE Result Date: 04/20/2023    ECHOCARDIOGRAM REPORT   Patient Name:   Charles Holmes Date of Exam: 04/20/2023 Medical Rec #:  [id number]       Height:  68.0 in Accession #:    [id number]      Weight:       166.0 lb Date of Birth:  1965-06-03       BSA:          1.888 m Patient Age:    57 years        BP:           105/85 mmHg Patient Gender: M               HR:           71 bpm. Exam Location:  Inpatient Procedure: 2D Echo, Color Doppler and Cardiac Doppler (Both Spectral and Color            Flow Doppler were utilized during procedure). Indications:    Congestive Heart Failure I50.9  History:        Patient has prior history of Echocardiogram examinations, most                 recent 09/25/2022.  Sonographer:    Harriette Bouillon RDCS Referring Phys: [id number] [place] LEE IMPRESSIONS  1. LVAD inflow cannula visualized at LV apex. Left ventricular ejection fraction, by estimation, is <20%. The left ventricle has severely decreased function. The left ventricle demonstrates global hypokinesis. The  left ventricular internal cavity size was mildly dilated. Left ventricular diastolic parameters are indeterminate. The interventricular septum appears essentially midline.  2. Right ventricular systolic function is moderately reduced. The right ventricular size is moderately enlarged.  3. Left atrial size was mildly dilated.  4. The mitral valve is normal in structure. No evidence of mitral valve regurgitation. No evidence of mitral stenosis.  5. The aortic valve does not appear to open. The aortic valve was not well visualized. Aortic valve regurgitation is not visualized.  6. The inferior vena cava is normal in size with greater than 50% respiratory variability, suggesting right atrial pressure of 3 mmHg.  7. Technically difficult study with poor acoustic windows. FINDINGS  Left Ventricle: LVAD inflow cannula visualized at LV apex. Left ventricular ejection fraction, by estimation, is <20%. The left ventricle has severely decreased function. The left ventricle demonstrates global hypokinesis. The left ventricular internal cavity size was mildly dilated. There is no left ventricular hypertrophy. Left ventricular diastolic parameters are indeterminate. Right Ventricle: The right ventricular size is moderately enlarged. Right vetricular wall thickness was not well visualized. Right ventricular systolic function is moderately reduced. Left Atrium: Left atrial size was mildly dilated. Right Atrium: Right atrial size was not well visualized. Pericardium: There is no evidence of pericardial effusion. Mitral Valve: The mitral valve is normal in structure. There is mild calcification of the mitral valve leaflet(s). No evidence of mitral valve regurgitation. No evidence of mitral valve stenosis. Tricuspid Valve: The tricuspid valve is not well visualized. Tricuspid valve regurgitation is not demonstrated. Aortic Valve: The aortic valve does not appear to open. The aortic valve was not well visualized. Aortic valve  regurgitation is not visualized. Pulmonic Valve: The pulmonic valve was not well visualized. Pulmonic valve regurgitation is not visualized. Aorta: The aortic root is normal in size and structure. Venous: The inferior vena cava is normal in size with greater than 50% respiratory variability, suggesting right atrial pressure of 3 mmHg. IAS/Shunts: The interatrial septum was not well visualized.  LEFT VENTRICLE PLAX 2D LVIDd:         5.80 cm Diastology LVIDs:         5.50 cm LV e' medial:  3.48 cm/s LV PW:         0.90 cm LV e' lateral: 7.18 cm/s LV IVS:        0.80 cm  IVC IVC diam: 1.40 cm Dalton McleanMD Electronically signed by Wilfred Lacy Signature Date/Time: 04/20/2023/3:16:27 PM    Final     Medications:    Scheduled Medications:  acetaminophen  650 mg Oral Q6H   amoxicillin  1,000 mg Oral BID   atorvastatin  80 mg Oral Daily   bacitracin   Topical TID   Chlorhexidine Gluconate Cloth  6 each Topical Q0600   melatonin  5 mg Oral QHS   mexiletine  250 mg Oral BID   multivitamin with minerals  1 tablet Oral Daily   pantoprazole  40 mg Oral QAC breakfast   polyethylene glycol  17 g Oral Daily   senna-docusate  2 tablet Oral BID   sildenafil  20 mg Oral TID   spironolactone  25 mg Oral Daily   Infusions:  PRN Medications: HYDROmorphone, ondansetron (ZOFRAN) IV, traMADol  Patient Profile   Mr Weissinger is a 58 y.o. 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   Admitted for pre-op optimization for cystourethroscopy with retrograde pyelogram and stent insertion for gross hematuria.   Assessment/Plan:    Gross hematuria - Gross hematuria over the last 3-4 weeks.  - 4/1 Underwent Cystourethroscopy with retrograde pyelogram, ureteral washing and stent insertion - Will need repeat procedure at a later date. D/t hematuria R kidney unable to be successfully examined. CBI clamped.  - Foley out 4/3. Now using Purewick - If not  improving by Monday will need Urology to re-evalaute   2.  Chronic Systolic HF Hx of Cardiogenic Shock  - Diagnosed 11/21. Presented with VT. LHC 70% LAD  - cMRI 12/21 concerning for sarcoid and EF 18%.  - PET 2/22 at [facility] EF 25% + active sarcoid - Medtr[employer] CRT-D upgrade in 06/08/21 - Echo 6/24: EF <20%, RV okay, mod pericardial effusion, mod MR/TR - 08/24 HM III LVAD implant + clipping LAA d/t severe thickening and invagination of mitral valve annulus impeding flows. No evidence of sarcoid on biopsy. - Echo 4/3 EF < 20% VAD placement ok RV moderately HK. Septum midline Personally reviewed - Stable NYHA I  - Volume status low with frequent PI events - Will hold spiro. Give 1L LR over 2hr   3. HM-3 LVAD - VAD interrogated personally. Parameters stable. - DL site ok(on amox) - MAPs ok. LDH 187 - INR 2.3 -> 1.9. Continue holding warfarin due to hematuria.  - Off aspirin (on for hx CVA) - Echo reviewed Septum midline   4.  H/o stroke - Admitted 06/24 w/ R MCA stroke. S/p TPA and mechanical clot extraction. No residual deficits. Likely cardioembolic in setting of severe LV dysfunction. - Developed left sided weakness 08/14. CTA with R MCA infarct. Taken to IR for thrombectomy - Repeat CT head with small to moderate size hemorrhagic conversion.  - Recovered well with just mild LUE weakness. No change - Off ASA for now, will need to restart. Once hematuria resolves   5. Enterococcus faecalis bacteremia - Bcx 2/2 on 9/6 - ID consult 9/6 -> ampicillin and ceftriaxone - Echo 09/10 - no obvious vegetations - Completed 6 weeks of IV Ampicillin and Ceftriaxone. Now on chronic suppression with Amoxicillin. - DL site looks okay   6. Hx VT - ln setting  of potential sarcoid heart disease though core biopsy negative at time iff== - Off amio due to tremor. Continue mexiletine  - now s/p ICD. No recent VT   7. CAD - LHC 12/07/19 70-% LAD, no intervention - LHC 8/24 non obstructive CAD.  -  Continue statin.  - No s/s angina   8. 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  - apical core pathology not diagnostic of cardiac sarcoidosis.  - PET negative for sarcoid, Will not restart methotrexate - No change   9. 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 Pulmonary  - PFTs with severe obstructive defect, response to bronchodilator. FEV1 1.04L, FEV1/FVC 48% - stable  I reviewed the LVAD parameters from today, and compared the results to the patient's prior recorded data.  No programming changes were made.  The LVAD is functioning within specified parameters.  The patient performs LVAD self-test daily.  LVAD interrogation was negative for any significant power changes, alarms or PI events/speed drops.  LVAD equipment check completed and is in good working order.  Back-up equipment present.   LVAD education done on emergency procedures and precautions and reviewed exit site care.  Length of Stay: 5  Arvilla Meres, MD 04/21/2023, 9:15 AM  VAD Team --- VAD ISSUES ONLY--- Pager [phone] (7am - 7am)  Advanced Heart Failure Team  Pager [phone] (M-F; 7a - 5p)  Please contact [facility] for night-coverage after hours (5p -7a ) and weekends on [email or web address]

## 2023-04-22 DIAGNOSIS — R310 Gross hematuria: Secondary | ICD-10-CM | POA: Diagnosis not present

## 2023-04-22 LAB — CBC
HCT: 39.4 % (ref 39.0–52.0)
Hemoglobin: 12.9 g/dL — ABNORMAL LOW (ref 13.0–17.0)
MCH: 30.2 pg (ref 26.0–34.0)
MCHC: 32.7 g/dL (ref 30.0–36.0)
MCV: 92.3 fL (ref 80.0–100.0)
Platelets: 177 10*3/uL (ref 150–400)
RBC: 4.27 MIL/uL (ref 4.22–5.81)
RDW: 19.4 % — ABNORMAL HIGH (ref 11.5–15.5)
WBC: 5.6 10*3/uL (ref 4.0–10.5)
nRBC: 0 % (ref 0.0–0.2)

## 2023-04-22 LAB — BASIC METABOLIC PANEL WITH GFR
Anion gap: 10 (ref 5–15)
BUN: 16 mg/dL (ref 6–20)
CO2: 29 mmol/L (ref 22–32)
Calcium: 8.7 mg/dL — ABNORMAL LOW (ref 8.9–10.3)
Chloride: 100 mmol/L (ref 98–111)
Creatinine, Ser: 1.29 mg/dL — ABNORMAL HIGH (ref 0.61–1.24)
GFR, Estimated: >60 mL/min (ref >60)
Glucose, Bld: 110 mg/dL — ABNORMAL HIGH (ref 70–99)
Potassium: 4 mmol/L (ref 3.5–5.1)
Sodium: 139 mmol/L (ref 135–145)

## 2023-04-22 LAB — LACTATE DEHYDROGENASE: LDH: 153 U/L (ref 98–192)

## 2023-04-22 LAB — PROTIME-INR
INR: 1.4 — ABNORMAL HIGH (ref 0.8–1.2)
Prothrombin Time: 17.1 s — ABNORMAL HIGH (ref 11.4–15.2)

## 2023-04-22 MED ORDER — POTASSIUM CHLORIDE CRYS ER 20 MEQ PO TBCR
20.0000 meq | EXTENDED_RELEASE_TABLET | Freq: Once | ORAL | Status: AC
  Administered 2023-04-22: 20 meq via ORAL
  Filled 2023-04-22: qty 1

## 2023-04-22 NOTE — Plan of Care (Signed)
  Problem: Education: Goal: Patient will understand all VAD equipment and how it functions Outcome: Progressing   Problem: Education: Goal: Patient will be able to verbalize current INR target range and antiplatelet therapy for discharge home Outcome: Progressing   Problem: Cardiac: Goal: LVAD will function as expected and patient will experience no clinical alarms Outcome: Progressing   Problem: Education: Goal: Knowledge of General Education information will improve Description: Including pain rating scale, medication(s)/side effects and non-pharmacologic comfort measures Outcome: Progressing

## 2023-04-22 NOTE — Progress Notes (Signed)
 Advanced Heart Failure VAD Team Note  PCP-Cardiologist: Norman Herrlich, MD  Chief Complaint: Gross hematuria Subjective:    4/1: S/p Cystourethroscopy with retrograde pyelogram, ureteral washing and stent insertion. Will need repeat procedure at a later date. D/t hematuria R kidney unable to be successfully examined.    Foley out. Said he had a rough night.. Condom cath wasn't working. Passing clots. Now improved with Purewick.   Holding warfarin due to hematuria. INR 2.3 -> 1.9   MAPs ok Numerous PI events/ Poor po intake   Echo 4/3 EF < 20% VAD placement ok RV moderately HK. Septum midline  LVAD INTERROGATION:  HeartMate III LVAD:   Flow 4 liters/min, speed 5400, power 3.8, PI 2.8  No PI events.  VAD interrogated personally.   Objective:    Vital Signs:   Temp:  [97.9 F (36.6 C)-98.4 F (36.9 C)] 98.2 F (36.8 C) (04/06 1016) Pulse Rate:  [72-81] 72 (04/06 1016) Resp:  [16-17] 16 (04/06 1016) BP: (73-103)/(57-87) 103/87 (04/06 1200) SpO2:  [85 %-95 %] 85 % (04/06 0400) Weight:  [76.8 kg] 76.8 kg (04/06 0500) Last BM Date : 04/20/23  Intake/Output:   Intake/Output Summary (Last 24 hours) at 04/22/2023 1319 Last data filed at 04/22/2023 0829 Gross per 24 hour  Intake 240 ml  Output 1200 ml  Net -960 ml    Physical Exam    General:  NAD HEENT: normal  Neck: supple; JVP 5cm Cor: LVAD hum.  Lungs: Clear. Abdomen: soft, nontender, non-distended. No hepatosplenomegaly. No bruits or masses. Good bowel sounds. Driveline site clean. Anchor in place.  Extremities: warm; no edema;  Neuro: alert & oriented x 3. No focal deficits. Moves all 4 without problem    Telemetry   V paced 70 Personally reviewed  Labs   Basic Metabolic Panel: Recent Labs  Lab 04/16/23 1614 04/17/23 0222 04/18/23 0228 04/19/23 0236 04/20/23 0224 04/21/23 0218  NA 138 140 136 136 137 138  K 4.3 4.1 4.3 4.1 3.9 3.8  CL 103 105 101 102 100 99  CO2 28 30 28 27 29 31   GLUCOSE 71 82 77  115* 121* 107*  BUN 10 12 10 13 16 14   CREATININE 0.93 0.99 0.99 0.91 1.15 1.22  CALCIUM 8.9 9.0 8.7* 8.8* 9.0 9.2  MG 1.9  --   --  1.9  --   --    Liver Function Tests: Recent Labs  Lab 04/16/23 1614  AST 30  ALT 27  ALKPHOS 86  BILITOT 1.5*  PROT 6.2*  ALBUMIN 3.7   CBC: Recent Labs  Lab 04/16/23 1614 04/17/23 0222 04/18/23 0228 04/19/23 0236 04/20/23 0224 04/21/23 0218  WBC 7.1 7.4 8.5 10.1 9.5 7.6  NEUTROABS 4.8  --   --   --   --   --   HGB 14.3 13.6 14.5 15.1 14.8 14.8  HCT 43.8 41.6 43.5 45.2 44.6 45.4  MCV 90.3 89.8 90.2 89.0 90.7 92.5  PLT 162 158 158 160 145* 173   INR: Recent Labs  Lab 04/18/23 0228 04/19/23 0236 04/20/23 0224 04/21/23 0218 04/22/23 0222  INR 1.6* 1.7* 2.3* 1.9* 1.4*   Imaging   ECHOCARDIOGRAM COMPLETE Result Date: 04/20/2023    ECHOCARDIOGRAM REPORT   Patient Name:   Charles Holmes Date of Exam: 04/20/2023 Medical Rec #:  [id number]       Height:       68.0 in Accession #:    [id number]      Weight:  166.0 lb Date of Birth:  [birth year]       BSA:          1.888 m Patient Age:    57 years        BP:           105/85 mmHg Patient Gender: M               HR:           71 bpm. Exam Location:  Inpatient Procedure: 2D Echo, Color Doppler and Cardiac Doppler (Both Spectral and Color            Flow Doppler were utilized during procedure). Indications:    Congestive Heart Failure I50.9  History:        Patient has prior history of Echocardiogram examinations, most                 recent 09/25/2022.  Sonographer:    Harriette Bouillon RDCS Referring Phys: [id number] [place] LEE IMPRESSIONS  1. LVAD inflow cannula visualized at LV apex. Left ventricular ejection fraction, by estimation, is <20%. The left ventricle has severely decreased function. The left ventricle demonstrates global hypokinesis. The left ventricular internal cavity size was mildly dilated. Left ventricular diastolic parameters are indeterminate. The interventricular septum appears  essentially midline.  2. Right ventricular systolic function is moderately reduced. The right ventricular size is moderately enlarged.  3. Left atrial size was mildly dilated.  4. The mitral valve is normal in structure. No evidence of mitral valve regurgitation. No evidence of mitral stenosis.  5. The aortic valve does not appear to open. The aortic valve was not well visualized. Aortic valve regurgitation is not visualized.  6. The inferior vena cava is normal in size with greater than 50% respiratory variability, suggesting right atrial pressure of 3 mmHg.  7. Technically difficult study with poor acoustic windows. FINDINGS  Left Ventricle: LVAD inflow cannula visualized at LV apex. Left ventricular ejection fraction, by estimation, is <20%. The left ventricle has severely decreased function. The left ventricle demonstrates global hypokinesis. The left ventricular internal cavity size was mildly dilated. There is no left ventricular hypertrophy. Left ventricular diastolic parameters are indeterminate. Right Ventricle: The right ventricular size is moderately enlarged. Right vetricular wall thickness was not well visualized. Right ventricular systolic function is moderately reduced. Left Atrium: Left atrial size was mildly dilated. Right Atrium: Right atrial size was not well visualized. Pericardium: There is no evidence of pericardial effusion. Mitral Valve: The mitral valve is normal in structure. There is mild calcification of the mitral valve leaflet(s). No evidence of mitral valve regurgitation. No evidence of mitral valve stenosis. Tricuspid Valve: The tricuspid valve is not well visualized. Tricuspid valve regurgitation is not demonstrated. Aortic Valve: The aortic valve does not appear to open. The aortic valve was not well visualized. Aortic valve regurgitation is not visualized. Pulmonic Valve: The pulmonic valve was not well visualized. Pulmonic valve regurgitation is not visualized. Aorta: The aortic  root is normal in size and structure. Venous: The inferior vena cava is normal in size with greater than 50% respiratory variability, suggesting right atrial pressure of 3 mmHg. IAS/Shunts: The interatrial septum was not well visualized.  LEFT VENTRICLE PLAX 2D LVIDd:         5.80 cm Diastology LVIDs:         5.50 cm LV e' medial:  3.48 cm/s LV PW:         0.90 cm LV e' lateral: 7.18 cm/s  LV IVS:        0.80 cm  IVC IVC diam: 1.40 cm Dalton McleanMD Electronically signed by Wilfred Lacy Signature Date/Time: 04/20/2023/3:16:27 PM    Final     Medications:    Scheduled Medications:  acetaminophen  650 mg Oral Q6H   amoxicillin  1,000 mg Oral BID   atorvastatin  80 mg Oral Daily   bacitracin   Topical TID   Chlorhexidine Gluconate Cloth  6 each Topical Q0600   melatonin  5 mg Oral QHS   mexiletine  250 mg Oral BID   multivitamin with minerals  1 tablet Oral Daily   pantoprazole  40 mg Oral QAC breakfast   polyethylene glycol  17 g Oral Daily   potassium chloride  20 mEq Oral Once   senna-docusate  2 tablet Oral BID   sildenafil  20 mg Oral TID   Infusions:  PRN Medications: HYDROmorphone, ondansetron (ZOFRAN) IV, traMADol  Patient Profile   Charles Holmes is a 58 y.o. 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   Admitted for pre-op optimization for cystourethroscopy with retrograde pyelogram and stent insertion for gross hematuria.   Assessment/Plan:    Gross hematuria - Gross hematuria over the last 3-4 weeks.  - 4/1 Underwent Cystourethroscopy with retrograde pyelogram, ureteral washing and stent insertion - Will need repeat procedure at a later date. D/t hematuria R kidney unable to be successfully examined. CBI clamped.  - Foley out 4/3. Now using Purewick - continues to have dark hematuria; will D/C purewick and start frequent bladder scans. If he continues to have hematuria or clots will reach out to urology again.  -  Repeat CBC ordered    2.  Chronic Systolic HF Hx of Cardiogenic Shock  - Diagnosed 11/21. Presented with VT. LHC 70% LAD  - cMRI 12/21 concerning for sarcoid and EF 18%.  - PET 2/22 at [facility] EF 25% + active sarcoid - Medtronic CRT-D upgrade in 06/08/21 - Echo 6/24: EF <20%, RV okay, mod pericardial effusion, mod Charles/TR - 08/24 HM III LVAD implant + clipping LAA d/t severe thickening and invagination of mitral valve annulus impeding flows. No evidence of sarcoid on biopsy. - Echo 4/3 EF < 20% VAD placement ok RV moderately HK. Septum midline Personally reviewed - Stable NYHA I  - euvolemic today; No PI events. Labs pending   3. HM-3 LVAD - VAD interrogated personally. Parameters stable. - DL site ok(on amox) - MAPs ok. LDH 187 - INR 2.3 -> 1.9. Continue holding warfarin due to hematuria.  - Off aspirin (on for hx CVA) - Echo reviewed Septum midline   4.  H/o stroke - Admitted 06/24 w/ R MCA stroke. S/p TPA and mechanical clot extraction. No residual deficits. Likely cardioembolic in setting of severe LV dysfunction. - Developed left sided weakness 08/14. CTA with R MCA infarct. Taken to IR for thrombectomy - Repeat CT head with small to moderate size hemorrhagic conversion.  - Recovered well with just mild LUE weakness. No change - Off ASA for now, will need to restart. Once hematuria resolves   5. Enterococcus faecalis bacteremia - Bcx 2/2 on 9/6 - ID consult 9/6 -> ampicillin and ceftriaxone - Echo 09/10 - no obvious vegetations - Completed 6 weeks of IV Ampicillin and Ceftriaxone. Now on chronic suppression with Amoxicillin. - DL site looks okay   6. Hx VT - ln setting of potential sarcoid heart  disease though core biopsy negative at time iff== - Off amio due to tremor. Continue mexiletine  - now s/p ICD. No recent VT   7. CAD - LHC 12/07/19 70-% LAD, no intervention - LHC 8/24 non obstructive CAD.  - Continue statin.  - No s/s angina   8. 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  - apical core pathology not diagnostic of cardiac sarcoidosis.  - PET negative for sarcoid, Will not restart methotrexate - No change   9. 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 Pulmonary  - PFTs with severe obstructive defect, response to bronchodilator. FEV1 1.04L, FEV1/FVC 48% - stable  I reviewed the LVAD parameters from today, and compared the results to the patient's prior recorded data.  No programming changes were made.  The LVAD is functioning within specified parameters.  The patient performs LVAD self-test daily.  LVAD interrogation was negative for any significant power changes, alarms or PI events/speed drops.  LVAD equipment check completed and is in good working order.  Back-up equipment present.   LVAD education done on emergency procedures and precautions and reviewed exit site care.  Length of Stay: 6  Dushawn Pusey, DO 04/22/2023, 1:19 PM  VAD Team --- VAD ISSUES ONLY--- Pager [phone] (7am - 7am)  Advanced Heart Failure Team  Pager [phone] (M-F; 7a - 5p)  Please contact [facility] for night-coverage after hours (5p -7a ) and weekends on [email or web address]

## 2023-04-22 NOTE — Plan of Care (Signed)
  Problem: Cardiac: Goal: LVAD will function as expected and patient will experience no clinical alarms Outcome: Progressing   Problem: Clinical Measurements: Goal: Cardiovascular complication will be avoided Outcome: Progressing   Problem: Pain Managment: Goal: General experience of comfort will improve and/or be controlled Outcome: Progressing

## 2023-04-23 ENCOUNTER — Other Ambulatory Visit: Payer: Self-pay | Admitting: Urology

## 2023-04-23 DIAGNOSIS — I5022 Chronic systolic (congestive) heart failure: Secondary | ICD-10-CM | POA: Diagnosis not present

## 2023-04-23 DIAGNOSIS — Z95811 Presence of heart assist device: Secondary | ICD-10-CM | POA: Diagnosis not present

## 2023-04-23 DIAGNOSIS — R310 Gross hematuria: Secondary | ICD-10-CM | POA: Diagnosis not present

## 2023-04-23 LAB — CBC
HCT: 41.2 % (ref 39.0–52.0)
Hemoglobin: 13.5 g/dL (ref 13.0–17.0)
MCH: 29.9 pg (ref 26.0–34.0)
MCHC: 32.8 g/dL (ref 30.0–36.0)
MCV: 91.4 fL (ref 80.0–100.0)
Platelets: 183 10*3/uL (ref 150–400)
RBC: 4.51 MIL/uL (ref 4.22–5.81)
RDW: 19.1 % — ABNORMAL HIGH (ref 11.5–15.5)
WBC: 6.3 10*3/uL (ref 4.0–10.5)
nRBC: 0 % (ref 0.0–0.2)

## 2023-04-23 LAB — BASIC METABOLIC PANEL WITH GFR
Anion gap: 9 (ref 5–15)
BUN: 19 mg/dL (ref 6–20)
CO2: 30 mmol/L (ref 22–32)
Calcium: 9.2 mg/dL (ref 8.9–10.3)
Chloride: 103 mmol/L (ref 98–111)
Creatinine, Ser: 1.11 mg/dL (ref 0.61–1.24)
GFR, Estimated: >60 mL/min (ref >60)
Glucose, Bld: 104 mg/dL — ABNORMAL HIGH (ref 70–99)
Potassium: 4 mmol/L (ref 3.5–5.1)
Sodium: 142 mmol/L (ref 135–145)

## 2023-04-23 LAB — PROTIME-INR
INR: 1.2 (ref 0.8–1.2)
Prothrombin Time: 15.5 s — ABNORMAL HIGH (ref 11.4–15.2)

## 2023-04-23 LAB — LACTATE DEHYDROGENASE: LDH: 174 U/L (ref 98–192)

## 2023-04-23 NOTE — Plan of Care (Signed)
  Problem: Education: Goal: Patient will understand all VAD equipment and how it functions Outcome: Progressing Goal: Patient will be able to verbalize current INR target range and antiplatelet therapy for discharge home Outcome: Progressing   Problem: Cardiac: Goal: LVAD will function as expected and patient will experience no clinical alarms Outcome: Progressing   Problem: Education: Goal: Knowledge of General Education information will improve Description: Including pain rating scale, medication(s)/side effects and non-pharmacologic comfort measures Outcome: Progressing   Problem: Health Behavior/Discharge Planning: Goal: Ability to manage health-related needs will improve Outcome: Progressing   Problem: Clinical Measurements: Goal: Ability to maintain clinical measurements within normal limits will improve Outcome: Progressing Goal: Will remain free from infection Outcome: Progressing Goal: Diagnostic test results will improve Outcome: Progressing Goal: Respiratory complications will improve Outcome: Progressing Goal: Cardiovascular complication will be avoided Outcome: Progressing   Problem: Activity: Goal: Risk for activity intolerance will decrease Outcome: Progressing   Problem: Nutrition: Goal: Adequate nutrition will be maintained Outcome: Progressing   Problem: Coping: Goal: Level of anxiety will decrease Outcome: Progressing   Problem: Elimination: Goal: Will not experience complications related to bowel motility Outcome: Progressing Goal: Will not experience complications related to urinary retention Outcome: Progressing   Problem: Pain Managment: Goal: General experience of comfort will improve and/or be controlled Outcome: Progressing   Problem: Safety: Goal: Ability to remain free from injury will improve Outcome: Progressing   Problem: Skin Integrity: Goal: Risk for impaired skin integrity will decrease Outcome: Progressing

## 2023-04-23 NOTE — Progress Notes (Signed)
 PHARMACY - ANTICOAGULATION CONSULT NOTE  Pharmacy Consult for Coumadin Indication:  VAD patient  Allergies  Allergen Reactions   Pacerone [Amiodarone] Other (See Comments)    Severe tremors   Percocet [Oxycodone-Acetaminophen] Itching    Patient Measurements: Height: (P) 5\' 8"  (172.7 cm) Weight: 75.8 kg (167 lb 3.2 oz) IBW/kg (Calculated) : (P) 68.4 HEPARIN DW (KG): (P) 77  Vital Signs: Temp: 98.2 F (36.8 C) (04/07 1055) Temp Source: Oral (04/07 1055) BP: 80/69 (04/07 1055) Pulse Rate: 64 (04/07 1055)  Labs: Recent Labs    04/21/23 0218 04/22/23 0222 04/22/23 1511 04/23/23 0223 04/23/23 0817  HGB 14.8  --  12.9*  --  13.5  HCT 45.4  --  39.4  --  41.2  PLT 173  --  177  --  183  LABPROT 21.7* 17.1*  --  15.5*  --   INR 1.9* 1.4*  --  1.2  --   CREATININE 1.22  --  1.29*  --  1.11    Estimated Creatinine Clearance: 69.3 mL/min (by C-G formula based on SCr of 1.11 mg/dL).   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 possible cardiac sarcoidosis.   Vaccine counseling 11/19/2022   Wide-complex tachycardia 2021   wears LifeVest    Medications:  Medications Prior to Admission  Medication Sig Dispense Refill Last Dose/Taking   amoxicillin (AMOXIL) 500 MG tablet Take 2 tablets (1,000 mg total) by mouth 2 (two) times daily. 360 tablet 3 04/16/2023 Morning   aspirin EC 81 MG tablet Take 1 tablet (81 mg total) by mouth daily. Swallow whole. 120 tablet 0 Past  Week   atorvastatin (LIPITOR) 80 MG tablet Take 1 tablet (80 mg total) by mouth daily. 30 tablet 0 04/16/2023 Morning   fluticasone-salmeterol (ADVAIR HFA) 230-21 MCG/ACT inhaler Inhale 2 puffs into the lungs 2 (two) times daily. 1 each 12 04/16/2023 Morning   melatonin 5 MG TABS Take 5 mg by mouth at bedtime.   04/15/2023 Bedtime   mexiletine (MEXITIL) 250 MG capsule TAKE 1 CAPSULE BY MOUTH 2 TIMES DAILY. 180 capsule 3 04/16/2023 Morning   pantoprazole (PROTONIX) 40 MG tablet Take 1 tablet (40 mg total) by mouth daily before breakfast. 30 tablet 0 04/16/2023 Morning   sertraline (ZOLOFT) 50 MG tablet TAKE 1 TABLET BY MOUTH EVERY DAY 90 tablet 3 04/16/2023 Morning   sildenafil (REVATIO) 20 MG tablet Take 1 tablet (20 mg total) by mouth 3 (three) times daily. 90 tablet 11 04/16/2023 Morning   spironolactone (ALDACTONE) 25 MG tablet Take 1 tablet (25 mg total) by mouth daily. 90 tablet 3 04/16/2023 Morning   warfarin (COUMADIN) 3 MG tablet Take 6 mg (2 tabs) every Monday/Wednesday/Friday and 3 mg (1 tab) all other days or as directed by the advanced heart failure clinic (Patient taking differently: Take 3-6 mg by mouth See admin instructions. Take 2 tablets (6mg ) by mouth every Monday, Wednesday and Friday - at 1600. Take 1 tablet (3mg ) by mouth  at 1600 on all other days.) 180 tablet 3 04/13/2023   Multiple Vitamin (MULTIVITAMIN WITH MINERALS) TABS tablet Take 1 tablet by mouth daily. (Patient not taking: Reported on 04/16/2023)   Not Taking    Assessment: 58 y.o. M presents for cystourethroscopy (scheduled 4/1 1045) for hematuria. Pt on warfarin PTA for LVAD. Admission INR 2.4. CBC ok on admission. Home dose: 6mg  M/W/F and 3mg  all other days.   Per discussion with nephrology, will hold coumadin until bleeding slows. Ongoing hematuria - INR 1.7> 2.3 (s/p 1 dose warfarin 6mg  4/2) - on hold again INR down 1.2 HGB stable 13  Holding bASA 4/3 - Hx CVA x2 1pre and 1post LVAD  Goal of Therapy:  INR 2-2.5 (per  [facility] clinic notes 3/12 2025) Monitor platelets by anticoagulation protocol: Yes   Plan:  Hold Coumadin for now. Will continue to monitor hematuria.    Leota Sauers Pharm.D. CPP, BCPS Clinical Pharmacist [phone] 04/23/2023 1:58 PM    [facility] pharmacy phone numbers are listed on [email or web address]

## 2023-04-23 NOTE — Progress Notes (Signed)
 LVAD Coordinator Rounding Note:  Admitted 04/16/23 for cystouteroscopy with stent insertion per Dr Arita Miss.  HM 3 LVAD implanted on 08/29/22 by Dr Donata Clay under destination therapy criteria.   Cystourethroscopy with retrograde pyelogram, ureteral washing and stent insertion 04/17/23. Need for repeat procedure at a later date d/t hematuria R kidney unable to be successfully examined. CBI stopped.  Pt lying in bed on my arrival asking about the plan.  Will restart Coumadin tonight with a goal of 1.5-2 and restart ASA 81 mg.  Vital signs: Temp: 97.6 HR: 75 Doppler Pressure: 80 Automatic BP: 80/69 (75) O2 Sat: 93% on RA Wt: 169.8>168.8>164.6>166.0>167.2 lbs   LVAD interrogation reveals:  Speed: 5400 Flow: 4.5 Power: 3.9 w PI: 3.2  Alarms: none Events: 40 PI events today; 50 yesterday  Hematocrit: 39 Fixed speed: 5400 Low speed limit: 5100  Drive Line:  Existing VAD dressing clean, dry, and intact. Drive line anchor applied. Pt denies fever or chills. Next dressing change 04/25/23 by bedside nurse.  Labs:  Hgb trend:13.6>14.5>15.1>14.8>12.9  LDH trend: 163>180>159>183>174  INR trend: 2.2>1.6>1.7>2.3>1.2  Anticoagulation Plan: -INR Goal: 1.5-2 -ASA Dose: 81 mg   - Coumadin dosing per pharmacy   Blood Products:   Device: Medtronic BiV -Therapies: ON VF ON >207 BPM FVT ON 176-207 BPM VT ON 171-207 BPM  Adverse Events on VAD: - 08/30/22: - Developed left sided weakness this am. CTA with R MCA infarct. Taken to IR for thrombectomy   Plan/Recommendations:  Please page VAD coordinator for any alarms or VAD equipment issues. Weekly drive line dressings per bedside RN  Carlton Adam RN VAD Coordinator  Office: [phone]  24/7 Pager: [phone]

## 2023-04-23 NOTE — TOC Transition Note (Signed)
 Transition of Care [facility]) - Discharge Note   Patient Details  Name: Charles Holmes MRN: [id number] Date of Birth: 1965/10/04  [facility]) CM/SW Contact:  Elliot Cousin, RN Phone Number: [phone] 04/23/2023, 5:22 PM   Clinical Narrative:     TOC CM spoke to pt  and states he lives alone. His sister will provide transportation home. PCP appt arranged and placed on AVS.   Dr Anna Genre, hospital follow up scheduled for 04/30/2023 at 2 pm.   Final next level of care: Home/Self Care Barriers to Discharge: No Barriers Identified   Patient Goals and CMS Choice Patient states their goals for this hospitalization and ongoing recovery are:: wants to remain independent          Discharge Placement                       Discharge Plan and Services Additional resources added to the After Visit Summary for     Discharge Planning Services: CM Consult                                 Social Drivers of Health (SDOH) Interventions SDOH Screenings   Food Insecurity: Food Insecurity Present (04/16/2023)  Housing: Low Risk  (04/16/2023)  Transportation Needs: No Transportation Needs (04/16/2023)  Utilities: Not At Risk (04/16/2023)  Depression (PHQ2-9): Low Risk  (02/26/2023)  Financial Resource Strain: Low Risk  (07/12/2022)  Tobacco Use: Medium Risk (04/17/2023)     Readmission Risk Interventions     No data to display

## 2023-04-23 NOTE — Plan of Care (Signed)
  Problem: Education: Goal: Patient will understand all VAD equipment and how it functions Outcome: Progressing   Problem: Cardiac: Goal: LVAD will function as expected and patient will experience no clinical alarms Outcome: Progressing   Problem: Education: Goal: Knowledge of General Education information will improve Description: Including pain rating scale, medication(s)/side effects and non-pharmacologic comfort measures Outcome: Progressing   Problem: Health Behavior/Discharge Planning: Goal: Ability to manage health-related needs will improve Outcome: Progressing   Problem: Clinical Measurements: Goal: Will remain free from infection Outcome: Progressing Goal: Diagnostic test results will improve Outcome: Progressing   Problem: Activity: Goal: Risk for activity intolerance will decrease Outcome: Progressing   Problem: Nutrition: Goal: Adequate nutrition will be maintained Outcome: Progressing   Problem: Coping: Goal: Level of anxiety will decrease Outcome: Progressing   Problem: Elimination: Goal: Will not experience complications related to bowel motility Outcome: Progressing Goal: Will not experience complications related to urinary retention Outcome: Progressing

## 2023-04-23 NOTE — Progress Notes (Addendum)
 Advanced Heart Failure VAD Team Note  PCP-Cardiologist: Norman Herrlich, MD  Chief Complaint: Gross hematuria Subjective:   4/1: S/p Cystourethroscopy with retrograde pyelogram, ureteral washing and stent insertion. Will need repeat procedure at a later date. D/t hematuria R kidney unable to be successfully examined.    -650 UOP. Hgb 14.8>12.9> CBC today  Holding warfarin due to hematuria. INR 2.3 -> 1.9 ->1.4-> 1.2  Walking around the unit. Asking to go home. Denies CP/SOB.   LVAD INTERROGATION:  HeartMate III LVAD:   Flow 4.6 liters/min, speed 5400, power 4.2, PI 4.4  Unable to fully interrogate device as he was on batteries.   Objective:   Echo 4/3 EF < 20% VAD placement ok RV moderately HK. Septum midline  Vital Signs:   Temp:  [98 F (36.7 C)-98.5 F (36.9 C)] 98.4 F (36.9 C) (04/07 0437) Pulse Rate:  [67-80] 75 (04/07 0437) Resp:  [16-19] 19 (04/07 0437) BP: (89-104)/(57-87) 93/70 (04/07 0437) SpO2:  [96 %-97 %] 96 % (04/07 0437) Weight:  [75.8 kg] 75.8 kg (04/07 0437) Last BM Date : 04/22/23  Intake/Output:   Intake/Output Summary (Last 24 hours) at 04/23/2023 0724 Last data filed at 04/23/2023 0000 Gross per 24 hour  Intake 240 ml  Output 650 ml  Net -410 ml    Physical Exam    General:  Well appearing. No resp difficulty HEENT: Normal Neck: supple. JVP flat.  Cor: Mechanical heart sounds with LVAD hum present. Lungs: Clear Abdomen: soft, nontender, nondistended. Good bowel sounds. Driveline: C/D/I; securement device intact and driveline incorporated Extremities: no cyanosis, clubbing, rash, edema Neuro: alert & oriented x3. Moves all 4 extremities w/o difficulty. Affect pleasant   Telemetry   V paced 90s (Personally reviewed)    Labs   Basic Metabolic Panel: Recent Labs  Lab 04/16/23 1614 04/17/23 0222 04/18/23 0228 04/19/23 0236 04/20/23 0224 04/21/23 0218 04/22/23 1511  NA 138   < > 136 136 137 138 139  K 4.3   < > 4.3 4.1 3.9 3.8 4.0  CL  103   < > 101 102 100 99 100  CO2 28   < > 28 27 29 31 29   GLUCOSE 71   < > 77 115* 121* 107* 110*  BUN 10   < > 10 13 16 14 16   CREATININE 0.93   < > 0.99 0.91 1.15 1.22 1.29*  CALCIUM 8.9   < > 8.7* 8.8* 9.0 9.2 8.7*  MG 1.9  --   --  1.9  --   --   --    < > = values in this interval not displayed.   Liver Function Tests: Recent Labs  Lab 04/16/23 1614  AST 30  ALT 27  ALKPHOS 86  BILITOT 1.5*  PROT 6.2*  ALBUMIN 3.7   CBC: Recent Labs  Lab 04/16/23 1614 04/17/23 0222 04/18/23 0228 04/19/23 0236 04/20/23 0224 04/21/23 0218 04/22/23 1511  WBC 7.1   < > 8.5 10.1 9.5 7.6 5.6  NEUTROABS 4.8  --   --   --   --   --   --   HGB 14.3   < > 14.5 15.1 14.8 14.8 12.9*  HCT 43.8   < > 43.5 45.2 44.6 45.4 39.4  MCV 90.3   < > 90.2 89.0 90.7 92.5 92.3  PLT 162   < > 158 160 145* 173 177   < > = values in this interval not displayed.   INR: Recent Labs  Lab  04/19/23 0236 04/20/23 0224 04/21/23 0218 04/22/23 0222 04/23/23 0223  INR 1.7* 2.3* 1.9* 1.4* 1.2   Imaging   No results found.   Medications:    Scheduled Medications:  acetaminophen  650 mg Oral Q6H   amoxicillin  1,000 mg Oral BID   atorvastatin  80 mg Oral Daily   bacitracin   Topical TID   melatonin  5 mg Oral QHS   mexiletine  250 mg Oral BID   multivitamin with minerals  1 tablet Oral Daily   pantoprazole  40 mg Oral QAC breakfast   polyethylene glycol  17 g Oral Daily   senna-docusate  2 tablet Oral BID   sildenafil  20 mg Oral TID   Infusions:  PRN Medications: HYDROmorphone, ondansetron (ZOFRAN) IV, traMADol  Patient Profile   Charles Holmes is a 58 y.o. 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   Admitted for pre-op optimization for cystourethroscopy with retrograde pyelogram and stent insertion for gross hematuria.   Assessment/Plan:   Gross hematuria - Gross hematuria over the last 3-4 weeks (on admission).  - 4/1 Underwent  Cystourethroscopy with retrograde pyelogram, ureteral washing and stent insertion - Will need repeat procedure at a later date. D/t hematuria R kidney unable to be successfully examined. Completed CBI  - Foley out 4/3. Now using Purewick - Continues to have dark hematuria. Now using urinal or purwick. If he continues to have hematuria or clots will reach out to urology again.  - Hgb 14.8>12.9> CBC today   2.  Chronic Systolic HF Hx of Cardiogenic Shock  - Diagnosed 11/21. Presented with VT. LHC 70% LAD  - cMRI 12/21 concerning for sarcoid and EF 18%.  - PET 2/22 at [facility] EF 25% + active sarcoid - Medtronic CRT-D upgrade in 06/08/21 - Echo 6/24: EF <20%, RV okay, mod pericardial effusion, mod Charles/TR - 08/24 HM III LVAD implant + clipping LAA d/t severe thickening and invagination of mitral valve annulus impeding flows. No evidence of sarcoid on biopsy. - Echo 4/3 EF < 20% VAD placement ok RV moderately HK. Septum midline Personally reviewed - Stable NYHA I  - euvolemic today. Labs pending   3. HM-3 LVAD - VAD interrogated personally. Parameters stable. - DL site ok(on amox) - MAPs ok. LDH 187 - INR 2.3 -> 1.9. Continue holding warfarin due to hematuria.  - Off aspirin (on for hx CVA) - Echo reviewed Septum midline   4.  H/o stroke - Admitted 06/24 w/ R MCA stroke. S/p TPA and mechanical clot extraction. No residual deficits. Likely cardioembolic in setting of severe LV dysfunction. - Developed left sided weakness 08/14. CTA with R MCA infarct. Taken to IR for thrombectomy - Repeat CT head with small to moderate size hemorrhagic conversion.  - Recovered well with just mild LUE weakness. No change - Off ASA for now, will need to restart once hematuria resolves   5. Enterococcus faecalis bacteremia - Bcx 2/2 on 9/6 - ID consult 9/6 -> ampicillin and ceftriaxone - Echo 09/10 - no obvious vegetations - Completed 6 weeks of IV Ampicillin and Ceftriaxone. Now on chronic suppression with  Amoxicillin. - DL site looks okay   6. Hx VT - ln setting of potential sarcoid heart disease though core biopsy negative at time iff== - Off amio due to tremor. Continue mexiletine  - now s/p ICD. No recent VT   7. CAD - LHC 12/07/19 70-%  LAD, no intervention - LHC 8/24 non obstructive CAD.  - Continue statin.  - No s/s angina   8. 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  - apical core pathology not diagnostic of cardiac sarcoidosis.  - PET negative for sarcoid, Will not restart methotrexate - No change   9. 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 Pulmonary  - PFTs with severe obstructive defect, response to bronchodilator. FEV1 1.04L, FEV1/FVC 48% - stable  Labs pending today. Sending stat CBC. Patient asking to go home but INR 1.2 and we have been holding coumadin d/t hematuria. Will discuss plan with team. Patient would not like to be here for another week+ prior to next procedure.   I reviewed the LVAD parameters from today, and compared the results to the patient's prior recorded data.  No programming changes were made.  The LVAD is functioning within specified parameters.  The patient performs LVAD self-test daily.  LVAD interrogation was negative for any significant power changes, alarms or PI events/speed drops.  LVAD equipment check completed and is in good working order.  Back-up equipment present.   LVAD education done on emergency procedures and precautions and reviewed exit site care.  Length of Stay: 7  Charles Bleacher, Charles Holmes 04/23/2023, 7:24 AM  VAD Team --- VAD ISSUES ONLY--- Pager [phone] (7am - 7am)  Advanced Heart Failure Team  Pager [phone] (M-F; 7a - 5p)  Please contact [facility] for night-coverage after hours (5p -7a ) and weekends on [email or web address]  Patient seen with Charles Holmes, I formulated the plan and agree with the above note.   Patient continues to have hematuria though hgb stable at 13.5. INR 1.2 today,  off warfarin.   No complaints, wants to go home.   General: Well appearing this am. NAD.  HEENT: Normal. Neck: Supple, JVP 7-8 cm. Carotids OK.  Cardiac:  Mechanical heart sounds with LVAD hum present.  Lungs:  CTAB, normal effort.  Abdomen:  NT, ND, no HSM. No bruits or masses. +BS  LVAD exit site: Well-healed and incorporated. Dressing dry and intact. No erythema or drainage. Stabilization device present and accurately applied. Driveline dressing changed daily per sterile technique. Extremities:  Warm and dry. No cyanosis, clubbing, rash, or edema.  Neuro:  Alert & oriented x 3. Cranial nerves grossly intact. Moves all 4 extremities w/o difficulty. Affect pleasant    Stable LVAD parameters.  MAP stable.  Warfarin on hold with hematuria.  He is no longer getting bladder irrigation.  Hematuria has yet to resolve though hgb remains stable, 13.5 today.  - Hold warfarin 1 more day, ?resume tomorrow aiming for INR 1.5-2 for now.  - We contacted urology, no further recommendations at this time.  - Continue to follow CBC.   Charles Holmes 04/23/2023 1:55 PM

## 2023-04-24 DIAGNOSIS — I5022 Chronic systolic (congestive) heart failure: Secondary | ICD-10-CM | POA: Diagnosis not present

## 2023-04-24 DIAGNOSIS — R310 Gross hematuria: Secondary | ICD-10-CM | POA: Diagnosis not present

## 2023-04-24 DIAGNOSIS — Z95811 Presence of heart assist device: Secondary | ICD-10-CM | POA: Diagnosis not present

## 2023-04-24 LAB — CBC
HCT: 41.4 % (ref 39.0–52.0)
Hemoglobin: 13.6 g/dL (ref 13.0–17.0)
MCH: 30 pg (ref 26.0–34.0)
MCHC: 32.9 g/dL (ref 30.0–36.0)
MCV: 91.4 fL (ref 80.0–100.0)
Platelets: 180 10*3/uL (ref 150–400)
RBC: 4.53 MIL/uL (ref 4.22–5.81)
RDW: 19 % — ABNORMAL HIGH (ref 11.5–15.5)
WBC: 6.2 10*3/uL (ref 4.0–10.5)
nRBC: 0 % (ref 0.0–0.2)

## 2023-04-24 LAB — PROTIME-INR
INR: 1.1 (ref 0.8–1.2)
Prothrombin Time: 14.8 s (ref 11.4–15.2)

## 2023-04-24 LAB — BASIC METABOLIC PANEL WITH GFR
Anion gap: 11 (ref 5–15)
BUN: 19 mg/dL (ref 6–20)
CO2: 28 mmol/L (ref 22–32)
Calcium: 9 mg/dL (ref 8.9–10.3)
Chloride: 101 mmol/L (ref 98–111)
Creatinine, Ser: 0.92 mg/dL (ref 0.61–1.24)
GFR, Estimated: >60 mL/min (ref >60)
Glucose, Bld: 79 mg/dL (ref 70–99)
Potassium: 4.3 mmol/L (ref 3.5–5.1)
Sodium: 140 mmol/L (ref 135–145)

## 2023-04-24 LAB — LACTATE DEHYDROGENASE: LDH: 194 U/L — ABNORMAL HIGH (ref 98–192)

## 2023-04-24 MED ORDER — ASPIRIN 81 MG PO TBEC
81.0000 mg | DELAYED_RELEASE_TABLET | Freq: Every day | ORAL | Status: DC
  Administered 2023-04-24: 81 mg via ORAL
  Filled 2023-04-24: qty 1

## 2023-04-24 MED ORDER — ACETAMINOPHEN 325 MG PO TABS: 650.0000 mg | ORAL_TABLET | Freq: Four times a day (QID) | ORAL | Status: DC | PRN

## 2023-04-24 MED ORDER — WARFARIN SODIUM 2 MG PO TABS: 2.0000 mg | ORAL_TABLET | Freq: Once | ORAL | Status: DC

## 2023-04-24 MED ORDER — WARFARIN SODIUM 3 MG PO TABS: ORAL_TABLET | ORAL | 3 refills | Status: AC

## 2023-04-24 MED ORDER — WARFARIN SODIUM 3 MG PO TABS
3.0000 mg | ORAL_TABLET | Freq: Once | ORAL | Status: AC
  Administered 2023-04-24: 3 mg via ORAL
  Filled 2023-04-24: qty 1

## 2023-04-24 MED ORDER — WARFARIN - PHARMACIST DOSING INPATIENT: Freq: Every day | Status: DC

## 2023-04-24 NOTE — Progress Notes (Signed)
 LVAD Coordinator Rounding Note:  Admitted 04/16/23 for cystouteroscopy with stent insertion per Dr Arita Miss.  HM 3 LVAD implanted on 08/29/22 by Dr Donata Clay under destination therapy criteria.   Cystourethroscopy with retrograde pyelogram, ureteral washing and stent insertion 04/17/23. Need for repeat procedure at a later date d/t hematuria R kidney unable to be successfully examined. CBI stopped.  Pt lying in bed asleep on my arrival. Bedside RN reports no issues at this time.  Will restart Coumadin tonight with a goal of 1.5-2 and restart ASA 81 mg. Repeat Cytoscopy scheduled for 05/11/23.  Vital signs: Temp: 98.3 HR: 70 Doppler Pressure: 80 Automatic BP: 101/83 (90) O2 Sat: 98% on RA Wt: 169.8>168.8>164.6>166.0>167.2>168.2 lbs   LVAD interrogation reveals:  Speed: 5400 Flow: 4.2 Power: 3.9 w PI: 3.4  Alarms: none Events: 30 PI events  Hematocrit: 41 Fixed speed: 5400 Low speed limit: 5100  Drive Line:  Existing VAD dressing clean, dry, and intact. Drive line anchor applied. Pt denies fever or chills. Next dressing change 04/25/23 by bedside nurse.  Labs:  Hgb trend:13.6>14.5>15.1>14.8>12.9>13.6  LDH trend: 163>180>159>183>174>194  INR trend: 2.2>1.6>1.7>2.3>1.2>1.1  Anticoagulation Plan: -INR Goal: 1.5-2 -ASA Dose: 81 mg   - Coumadin dosing per pharmacy   Blood Products:   Device: Medtronic BiV -Therapies: ON VF ON >207 BPM FVT ON 176-207 BPM VT ON 171-207 BPM  Adverse Events on VAD: - 08/30/22: - Developed left sided weakness this am. CTA with R MCA infarct. Taken to IR for thrombectomy   Plan/Recommendations:  Please page VAD coordinator for any alarms or VAD equipment issues. Weekly drive line dressings per bedside RN  Simmie Davies RN,BSN VAD Coordinator  Office: [phone]  24/7 Pager: [phone]

## 2023-04-24 NOTE — Progress Notes (Signed)
 Received call from [facility] at [facility]. Pt is schedule for repeat cystoscopy/stent placement on 05/11/23 at 9:30 AM at [facility]. INR goal < 2.0 per Dr Arita Miss.   Karle Plumber PharmD made aware of the above plan.   [name] RN VAD Coordinator  Office: [phone]  24/7 Pager: [phone]

## 2023-04-24 NOTE — Plan of Care (Signed)
  Problem: Education: Goal: Patient will understand all VAD equipment and how it functions Outcome: Progressing Goal: Patient will be able to verbalize current INR target range and antiplatelet therapy for discharge home Outcome: Progressing   Problem: Cardiac: Goal: LVAD will function as expected and patient will experience no clinical alarms Outcome: Progressing   Problem: Education: Goal: Knowledge of General Education information will improve Description: Including pain rating scale, medication(s)/side effects and non-pharmacologic comfort measures Outcome: Progressing   Problem: Health Behavior/Discharge Planning: Goal: Ability to manage health-related needs will improve Outcome: Progressing   Problem: Clinical Measurements: Goal: Ability to maintain clinical measurements within normal limits will improve Outcome: Progressing Goal: Will remain free from infection Outcome: Progressing Goal: Diagnostic test results will improve Outcome: Progressing Goal: Respiratory complications will improve Outcome: Progressing Goal: Cardiovascular complication will be avoided Outcome: Progressing   Problem: Activity: Goal: Risk for activity intolerance will decrease Outcome: Progressing   Problem: Nutrition: Goal: Adequate nutrition will be maintained Outcome: Progressing   Problem: Coping: Goal: Level of anxiety will decrease Outcome: Progressing   Problem: Elimination: Goal: Will not experience complications related to bowel motility Outcome: Progressing Goal: Will not experience complications related to urinary retention Outcome: Progressing   Problem: Pain Managment: Goal: General experience of comfort will improve and/or be controlled Outcome: Progressing   Problem: Safety: Goal: Ability to remain free from injury will improve Outcome: Progressing   Problem: Skin Integrity: Goal: Risk for impaired skin integrity will decrease Outcome: Progressing

## 2023-04-24 NOTE — Discharge Instructions (Signed)
Information on my medicine - Coumadin   (Warfarin)  This medication education was reviewed with me or my healthcare representative as part of my discharge preparation.   Why was Coumadin prescribed for you? Coumadin was prescribed for you because you have a blood clot or a medical condition that can cause an increased risk of forming blood clots. Blood clots can cause serious health problems by blocking the flow of blood to the heart, lung, or brain. Coumadin can prevent harmful blood clots from forming. As a reminder your indication for Coumadin is:  Blood Clot Prevention after Heart Pump Surgery  What test will check on my response to Coumadin? While on Coumadin (warfarin) you will need to have an INR test regularly to ensure that your dose is keeping you in the desired range. The INR (international normalized ratio) number is calculated from the result of the laboratory test called prothrombin time (PT).  If an INR APPOINTMENT HAS NOT ALREADY BEEN MADE FOR YOU please schedule an appointment to have this lab work done by your health care provider within 7 days. Your INR goal is a number between:  1.5-2.  What  do you need to  know  About  COUMADIN? Take Coumadin (warfarin) exactly as prescribed by your healthcare provider about the same time each day.  DO NOT stop taking without talking to the doctor who prescribed the medication.  Stopping without other blood clot prevention medication to take the place of Coumadin may increase your risk of developing a new clot or stroke.  Get refills before you run out.  What do you do if you miss a dose? If you miss a dose, take it as soon as you remember on the same day then continue your regularly scheduled regimen the next day.  Do not take two doses of Coumadin at the same time.  Important Safety Information A possible side effect of Coumadin (Warfarin) is an increased risk of bleeding. You should call your healthcare provider right away if you  experience any of the following: Bleeding from an injury or your nose that does not stop. Unusual colored urine (red or dark brown) or unusual colored stools (red or black). Unusual bruising for unknown reasons. A serious fall or if you hit your head (even if there is no bleeding).  Some foods or medicines interact with Coumadin (warfarin) and might alter your response to warfarin. To help avoid this: Eat a balanced diet, maintaining a consistent amount of Vitamin K. Notify your provider about major diet changes you plan to make. Avoid alcohol or limit your intake to 1 drink for women and 2 drinks for men per day. (1 drink is 5 oz. wine, 12 oz. beer, or 1.5 oz. liquor.)  Make sure that ANY health care provider who prescribes medication for you knows that you are taking Coumadin (warfarin).  Also make sure the healthcare provider who is monitoring your Coumadin knows when you have started a new medication including herbals and non-prescription products.  Coumadin (Warfarin)  Major Drug Interactions  Increased Warfarin Effect Decreased Warfarin Effect  Alcohol (large quantities) Antibiotics (esp. Septra/Bactrim, Flagyl, Cipro) Amiodarone (Cordarone) Aspirin (ASA) Cimetidine (Tagamet) Megestrol (Megace) NSAIDs (ibuprofen, naproxen, etc.) Piroxicam (Feldene) Propafenone (Rythmol SR) Propranolol (Inderal) Isoniazid (INH) Posaconazole (Noxafil) Barbiturates (Phenobarbital) Carbamazepine (Tegretol) Chlordiazepoxide (Librium) Cholestyramine (Questran) Griseofulvin Oral Contraceptives Rifampin Sucralfate (Carafate) Vitamin K   Coumadin (Warfarin) Major Herbal Interactions  Increased Warfarin Effect Decreased Warfarin Effect  Garlic Ginseng Ginkgo biloba Coenzyme Q10 Green tea St.  John's wort    Coumadin (Warfarin) FOOD Interactions  Eat a consistent number of servings per week of foods HIGH in Vitamin K (1 serving =  cup)  Collards (cooked, or boiled & drained) Kale  (cooked, or boiled & drained) Mustard greens (cooked, or boiled & drained) Parsley *serving size only =  cup Spinach (cooked, or boiled & drained) Swiss chard (cooked, or boiled & drained) Turnip greens (cooked, or boiled & drained)  Eat a consistent number of servings per week of foods MEDIUM-HIGH in Vitamin K (1 serving = 1 cup)  Asparagus (cooked, or boiled & drained) Broccoli (cooked, boiled & drained, or raw & chopped) Brussel sprouts (cooked, or boiled & drained) *serving size only =  cup Lettuce, raw (green leaf, endive, romaine) Spinach, raw Turnip greens, raw & chopped   These websites have more information on Coumadin (warfarin):  FailFactory.se; VeganReport.com.au;

## 2023-04-24 NOTE — Progress Notes (Addendum)
 Advanced Heart Failure VAD Team Note  PCP-Cardiologist: Norman Herrlich, MD  Chief Complaint: Gross hematuria Subjective:   4/1: S/p Cystourethroscopy with retrograde pyelogram, ureteral washing and stent insertion. Will need repeat procedure at a later date. D/t hematuria R kidney unable to be successfully examined.    Hgb 14.8>12.9>13.5> CBC today  Have been holding warfarin due to hematuria. INR 2.3 -> 1.9 ->1.4-> 1.2-> 1.1  Wants to go home. Denies CP/SOB.   LVAD INTERROGATION:  HeartMate III LVAD:   Flow 3.7 liters/min, speed 5400, power 3.8, PI 4.5  x14 PI events  Objective:   Echo 4/3 EF < 20% VAD placement ok RV moderately HK. Septum midline  Vital Signs:   Temp:  [98.1 F (36.7 C)-98.7 F (37.1 C)] 98.7 F (37.1 C) (04/08 0324) Pulse Rate:  [60-64] 64 (04/08 0324) Resp:  [19-20] 19 (04/07 2308) BP: (80-98)/(60-81) 98/81 (04/08 0324) SpO2:  [94 %] 94 % (04/07 1910) Weight:  [60 kg] 60 kg (04/08 0654) Last BM Date : 04/22/23  Intake/Output:   Intake/Output Summary (Last 24 hours) at 04/24/2023 0736 Last data filed at 04/23/2023 1920 Gross per 24 hour  Intake --  Output 200 ml  Net -200 ml    Physical Exam  General:  Well appearing. No resp difficulty Neck: supple. JVP flat Cor: Mechanical heart sounds with LVAD hum present. Lungs: Clear Driveline: C/D/I; securement device intact and driveline incorporated Extremities: no cyanosis, clubbing, rash, edema Neuro: alert & oriented x3. Moves all 4 extremities w/o difficulty. Affect pleasant  Telemetry   V paced 70s, intt PVCs (Personally reviewed)    Labs   Basic Metabolic Panel: Recent Labs  Lab 04/19/23 0236 04/20/23 0224 04/21/23 0218 04/22/23 1511 04/23/23 0817  NA 136 137 138 139 142  K 4.1 3.9 3.8 4.0 4.0  CL 102 100 99 100 103  CO2 27 29 31 29 30   GLUCOSE 115* 121* 107* 110* 104*  BUN 13 16 14 16 19   CREATININE 0.91 1.15 1.22 1.29* 1.11  CALCIUM 8.8* 9.0 9.2 8.7* 9.2  MG 1.9  --   --   --    --    Liver Function Tests: No results for input(s): "AST", "ALT", "ALKPHOS", "BILITOT", "PROT", "ALBUMIN" in the last 168 hours.  CBC: Recent Labs  Lab 04/19/23 0236 04/20/23 0224 04/21/23 0218 04/22/23 1511 04/23/23 0817  WBC 10.1 9.5 7.6 5.6 6.3  HGB 15.1 14.8 14.8 12.9* 13.5  HCT 45.2 44.6 45.4 39.4 41.2  MCV 89.0 90.7 92.5 92.3 91.4  PLT 160 145* 173 177 183   INR: Recent Labs  Lab 04/20/23 0224 04/21/23 0218 04/22/23 0222 04/23/23 0223 04/24/23 0223  INR 2.3* 1.9* 1.4* 1.2 1.1   Imaging   No results found.   Medications:    Scheduled Medications:  acetaminophen  650 mg Oral Q6H   amoxicillin  1,000 mg Oral BID   atorvastatin  80 mg Oral Daily   bacitracin   Topical TID   melatonin  5 mg Oral QHS   mexiletine  250 mg Oral BID   multivitamin with minerals  1 tablet Oral Daily   pantoprazole  40 mg Oral QAC breakfast   polyethylene glycol  17 g Oral Daily   senna-docusate  2 tablet Oral BID   sildenafil  20 mg Oral TID   Infusions:  PRN Medications: HYDROmorphone, ondansetron (ZOFRAN) IV, traMADol  Patient Profile   Charles Holmes is a 58 y.o. 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   Admitted for pre-op optimization for cystourethroscopy with retrograde pyelogram and stent insertion for gross hematuria.   Assessment/Plan:   Gross hematuria - Gross hematuria over the last 3-4 weeks (on admission).  - 4/1 Underwent Cystourethroscopy with retrograde pyelogram, ureteral washing and stent insertion - Will need repeat procedure at a later date. D/t hematuria R kidney unable to be successfully examined. Completed CBI  - Foley out 4/3. Now using Purewick - Continues to have dark hematuria. Now using urinal or purwick.  - Reached out to urology yesterday. No different plans and ok with starting AC if necessary. Per Charles. Holmes second procedure scheduled for 05/11/23 - Hgb 14.8>12.9> 13.5> pending  today - Plan to restart [facility] today with lower INR goal 1.5-2   2.  Chronic Systolic HF Hx of Cardiogenic Shock  - Diagnosed 11/21. Presented with VT. LHC 70% LAD  - cMRI 12/21 concerning for sarcoid and EF 18%.  - PET 2/22 at [facility] EF 25% + active sarcoid - [employer] CRT-D upgrade in 06/08/21 - Echo 6/24: EF <20%, RV okay, mod pericardial effusion, mod Charles/TR - 08/24 HM III LVAD implant + clipping LAA d/t severe thickening and invagination of mitral valve annulus impeding flows. No evidence of sarcoid on biopsy. - Echo 4/3 EF < 20% VAD placement ok RV moderately HK. Septum midline  - Stable NYHA I  - euvolemic today. Labs pending   3. HM-3 LVAD - VAD interrogated personally. Parameters stable. - DL site ok(on amox) - MAPs ok. LDH 187 - INR 2.3 -> 1.9->>>>>1.1. Holding warfarin due to hematuria. Plan to restart today with lower INR goal.  - Restart aspirin (on for hx CVA) - Echo reviewed Septum midline   4.  H/o stroke - Admitted 06/24 w/ R MCA stroke. S/p TPA and mechanical clot extraction. No residual deficits. Likely cardioembolic in setting of severe LV dysfunction. - Developed left sided weakness 08/14. CTA with R MCA infarct. Taken to IR for thrombectomy - Repeat CT head with small to moderate size hemorrhagic conversion.  - Recovered well with just mild LUE weakness. No change - Off ASA for now, will need to restart once hematuria resolves   5. Enterococcus faecalis bacteremia - Bcx 2/2 on 9/6 - ID consult 9/6 -> ampicillin and ceftriaxone - Echo 09/10 - no obvious vegetations - Completed 6 weeks of IV Ampicillin and Ceftriaxone. Now on chronic suppression with Amoxicillin. - DL site looks okay   6. Hx VT - ln setting of potential sarcoid heart disease though core biopsy negative at time - Off amio due to tremor. Continue mexiletine  - now s/p ICD. No recent VT   7. CAD - LHC 12/07/19 70-% LAD, no intervention - LHC 8/24 non obstructive CAD.  - Continue statin.  - No  s/s angina   8. 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  - apical core pathology not diagnostic of cardiac sarcoidosis.  - PET negative for sarcoid, Will not restart methotrexate - No change   9. 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 Pulmonary  - PFTs with severe obstructive defect, response to bronchodilator. FEV1 1.04L, FEV1/FVC 48% - stable   I reviewed the LVAD parameters from today, and compared the results to the patient's prior recorded data.  No programming changes were made.  The LVAD is functioning within specified parameters.  The patient performs  LVAD self-test daily.  LVAD interrogation was negative for any significant power changes, alarms or PI events/speed drops.  LVAD equipment check completed and is in good working order.  Back-up equipment present.   LVAD education done on emergency procedures and precautions and reviewed exit site care.  Length of Stay: 8  Alen Bleacher, NP 04/24/2023, 7:36 AM  VAD Team --- VAD ISSUES ONLY--- Pager [phone] (7am - 7am)  Advanced Heart Failure Team  Pager [phone] (M-F; 7a - 5p)  Please contact [facility] for night-coverage after hours (5p -7a ) and weekends on [email or web address]  Patient seen with NP, I formulated the plan and agree with the above note.    Patient continues to have hematuria but urine is getting lighter (no longer looks like frank blood).  Hgb stable 13.6. INR 1.1 today, has been off warfarin.    No complaints, wants to go home.    General: Well appearing this am. NAD.  HEENT: Normal. Neck: Supple, JVP 7-8 cm. Carotids OK.  Cardiac:  Mechanical heart sounds with LVAD hum present.  Lungs:  CTAB, normal effort.  Abdomen:  NT, ND, no HSM. No bruits or masses. +BS  LVAD exit site: Well-healed and incorporated. Dressing dry and intact. No erythema or drainage. Stabilization device present and accurately applied. Driveline dressing changed daily per sterile  technique. Extremities:  Warm and dry. No cyanosis, clubbing, rash, or edema.  Neuro:  Alert & oriented x 3. Cranial nerves grossly intact. Moves all 4 extremities w/o difficulty. Affect pleasant     Stable LVAD parameters.  MAP stable.  Hematuria is now starting to clear.  He is no longer getting bladder irrigation.  Hgb stable.  - Starting back on warfarin today, aiming for INR 1.5-2 for now.  I will not overlap with heparin gtt given ongoing hematuria.  - We contacted urology, no further recommendations at this time. He has a followup procedure with them on 4/25.  - Given CVA history, we are restarting ASA 81 daily.   I think he can go home today since hematuria is starting to clear and we will not be overlapping warfarin with heparin gtt.  He will followup with urology and in LVAD clinic.  Restarting warfarin for goal INR 1.5-2.   Charles Holmes 04/24/2023 1:03 PM

## 2023-04-24 NOTE — Progress Notes (Addendum)
 PHARMACY - ANTICOAGULATION CONSULT NOTE  Pharmacy Consult for Coumadin Indication:  VAD patient  Allergies  Allergen Reactions   Pacerone [Amiodarone] Other (See Comments)    Severe tremors   Percocet [Oxycodone-Acetaminophen] Itching    Patient Measurements: Height: (P) 5\' 8"  (172.7 cm) Weight: 60 kg (132 lb 4.4 oz) IBW/kg (Calculated) : (P) 68.4 HEPARIN DW (KG): (P) 77  Vital Signs: Temp: 98.7 F (37.1 C) (04/08 0324) Temp Source: Oral (04/08 0324) BP: 98/81 (04/08 0324) Pulse Rate: 64 (04/08 0324)  Labs: Recent Labs    04/22/23 0222 04/22/23 1511 04/23/23 0223 04/23/23 0817 04/24/23 0223  HGB  --  12.9*  --  13.5  --   HCT  --  39.4  --  41.2  --   PLT  --  177  --  183  --   LABPROT 17.1*  --  15.5*  --  14.8  INR 1.4*  --  1.2  --  1.1  CREATININE  --  1.29*  --  1.11  --     Estimated Creatinine Clearance: 60.8 mL/min (by C-G formula based on SCr of 1.11 mg/dL).   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 possible cardiac sarcoidosis.   Vaccine counseling 11/19/2022   Wide-complex tachycardia 2021   wears LifeVest    Medications:  Medications Prior to Admission  Medication Sig Dispense Refill Last Dose/Taking   amoxicillin (AMOXIL) 500 MG tablet Take 2 tablets (1,000 mg total) by mouth 2 (two) times daily. 360 tablet 3 04/16/2023 Morning   aspirin EC 81 MG tablet Take 1 tablet (81 mg total) by mouth daily. Swallow whole. 120 tablet 0 Past  Week   atorvastatin (LIPITOR) 80 MG tablet Take 1 tablet (80 mg total) by mouth daily. 30 tablet 0 04/16/2023 Morning   fluticasone-salmeterol (ADVAIR HFA) 230-21 MCG/ACT inhaler Inhale 2 puffs into the lungs 2 (two) times daily. 1 each 12 04/16/2023 Morning   melatonin 5 MG TABS Take 5 mg by mouth at bedtime.   04/15/2023 Bedtime   mexiletine (MEXITIL) 250 MG capsule TAKE 1 CAPSULE BY MOUTH 2 TIMES DAILY. 180 capsule 3 04/16/2023 Morning   pantoprazole (PROTONIX) 40 MG tablet Take 1 tablet (40 mg total) by mouth daily before breakfast. 30 tablet 0 04/16/2023 Morning   sertraline (ZOLOFT) 50 MG tablet TAKE 1 TABLET BY MOUTH EVERY DAY 90 tablet 3 04/16/2023 Morning   sildenafil (REVATIO) 20 MG tablet Take 1 tablet (20 mg total) by mouth 3 (three) times daily. 90 tablet 11 04/16/2023 Morning   spironolactone (ALDACTONE) 25 MG tablet Take 1 tablet (25 mg total) by mouth daily. 90 tablet 3 04/16/2023 Morning   warfarin (COUMADIN) 3 MG tablet Take 6 mg (2 tabs) every Monday/Wednesday/Friday and 3 mg (1 tab) all other days or as directed by the advanced heart failure clinic (Patient taking differently: Take 3-6 mg by mouth See admin instructions. Take 2 tablets (6mg ) by mouth every Monday, Wednesday and Friday -  at 1600. Take 1 tablet (3mg ) by mouth at 1600 on all other days.) 180 tablet 3 04/13/2023   Multiple Vitamin (MULTIVITAMIN WITH MINERALS) TABS tablet Take 1 tablet by mouth daily. (Patient not taking: Reported on 04/16/2023)   Not Taking    Assessment: 58 y.o. M presents for cystourethroscopy (scheduled 4/1 1045) for hematuria. Pt on warfarin PTA for LVAD. Admission INR 2.4. CBC ok on admission.  Home dose: 6mg  M/W/F and 3mg  all other days.   Warfarin has been on hold since 4/2. INR today is 1.1. Still with hematuria - HF MD and urology okay with restarting warfarin/aspirin today. Of note has hx CVA x2 (1-pre and 1-post LVAD). INR goal reduced as below. Hgb 13.6, plt 180, INR increase from 174>194.  Goal  of Therapy:  INR 1.5-2 (per [facility] clinic notes 3/12 2025) Monitor platelets by anticoagulation protocol: Yes   Plan:  Restart warfarin 3 mg tonight Restart aspirin 81 mg daily  Will continue to monitor hematuria, CBC, daily INR  Discharge regimen is warfarin 3 mg daily - has home INR machine so will plan for check on Thursday   Thank you for allowing pharmacy to participate in this patient's care,  Sherron Monday, PharmD, BCCCP Clinical Pharmacist  Phone: [phone] 04/24/2023 7:40 AM  Please check AMION for all [facility] phone numbers After 10:00 PM, call Main Pharmacy [phone]

## 2023-04-26 ENCOUNTER — Ambulatory Visit (HOSPITAL_COMMUNITY): Payer: Self-pay | Admitting: Pharmacist

## 2023-04-26 LAB — POCT INR: INR: 1.1 — AB (ref 2.0–3.0)

## 2023-04-30 ENCOUNTER — Other Ambulatory Visit (HOSPITAL_COMMUNITY): Payer: Self-pay

## 2023-04-30 DIAGNOSIS — I480 Paroxysmal atrial fibrillation: Secondary | ICD-10-CM | POA: Diagnosis not present

## 2023-04-30 DIAGNOSIS — Z95811 Presence of heart assist device: Secondary | ICD-10-CM | POA: Diagnosis not present

## 2023-04-30 DIAGNOSIS — R310 Gross hematuria: Secondary | ICD-10-CM | POA: Diagnosis not present

## 2023-04-30 DIAGNOSIS — D8685 Sarcoid myocarditis: Secondary | ICD-10-CM | POA: Diagnosis not present

## 2023-04-30 DIAGNOSIS — Z7901 Long term (current) use of anticoagulants: Secondary | ICD-10-CM

## 2023-04-30 DIAGNOSIS — Z79899 Other long term (current) drug therapy: Secondary | ICD-10-CM | POA: Diagnosis not present

## 2023-04-30 DIAGNOSIS — I5022 Chronic systolic (congestive) heart failure: Secondary | ICD-10-CM | POA: Diagnosis not present

## 2023-04-30 NOTE — Progress Notes (Signed)
 LVAD CLINIC NOTE  PCP: [name], PA-C Cardiologist: Norman Herrlich, MD HF MD:    HPI:  Charles Holmes is a 58 y.o. 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 Underwent HM- 3 LVAD implant on 08/13 + clipping left atrial appendage    Admitted to [facility] 11/21 with CP. Found to have VT and EF 25-30% mild AI, Charles and TR. Transferred to [facility]. Cath showed 70% prox LAD with no intervention.    S/P CRT-D upgrade 06/08/21.   6/24, admitted w/ acute CVA due to right M1 occlusion treated w/ mechanical thrombectomy.   Echo showed LV markedly dilated EF < 20% and small effusion. RV ok. CVA felt to be cardioembolic.   Admitted in 7/24 for low-output HF. Angiography showed mild nonobstructive CAD. RHC hemodynamics c/w severe NICM w/ severely elevated filling pressures and low output state c/w cardiogenic shock. Repeat PFTs w/ severe obstructive defect (FEV1 1.04L, FEV1/FVC 48%) +response to bronchodilator.   Underwent LVAD implant on 08/13 + clipping left atrial appendage d/t severe thickening and invagination of mitral valve annulus impeding flows. Apical core sent to pathology - no mention of sarcoid.   Post VAD implant c/b left-sided hemiplegia. CT head 8/14 with acute R MCA infarct. Taken to IR for thrombectomy. F/u head CT with small hemorrhagic conversion. Had some residual deficits w/ mild left sided weakness but otherwise recovered. Course further c/b enterococcus faecalis bacteremia w/ 2/2 + cultures on 9/6. ID consulted. Recommended ampicillin and ceftriaxone. PICC removed. Repeat BC X 2 09/08 NGTD. Will need 6 wks total therapy. EOD 11/04/22.   Admitted to [facility] in 4/25 for recurrent hematuria.  He underwent cystoscopy and stent placement on 04/17/2023 for stenosis of the distal ureter.  Due to severe hematuria he required CBI.  At discharge his INR goal was lowered to 1.5-2.  Aspirin was also held.   VAD Indication: Destination Therapy  - sarcoid   LVAD assessment: HM III   Primary Controller: VAD Speed: 5400 rpms Flow: 4.0 Power: 3.9 w    PI: 4.8 Alarms none Events: > 100 PI events today  Fixed speed: 5400 Low speed limit: 5100   Primary controller: back up battery due for replacement in 24 months Secondary controller: back up battery due for replacement in 24 months   I reviewed the LVAD parameters from today and compared the results to the patient's prior recorded data. LVAD interrogation was NEGATIVE for significant power changes, NEGATIVE for clinical alarms and STABLE for PI events/speed drops. No programming changes were made and pump is functioning within specified parameters. Pt is performing daily controller and system monitor self tests along with completing weekly and monthly maintenance for LVAD equipment.   LVAD equipment check completed and is in good working order. Back-up equipment not present at today's visit.   Annual Equipment Maintenance on UBC/PM was performed on 08/29/22.   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  possible cardiac sarcoidosis.   Vaccine counseling 11/19/2022   Wide-complex tachycardia 2021   wears LifeVest    Current Outpatient Medications  Medication Sig Dispense Refill   amoxicillin (AMOXIL) 500 MG tablet Take 2 tablets (1,000 mg total) by mouth 2 (two) times daily. 360 tablet 3   aspirin EC 81 MG tablet Take 1 tablet (81 mg total) by mouth daily. Swallow whole. 120 tablet 0    atorvastatin (LIPITOR) 80 MG tablet Take 1 tablet (80 mg total) by mouth daily. 30 tablet 0   fluticasone-salmeterol (ADVAIR HFA) 230-21 MCG/ACT inhaler Inhale 2 puffs into the lungs 2 (two) times daily. 1 each 12   melatonin 5 MG TABS Take 5 mg by mouth at bedtime.     mexiletine (MEXITIL) 250 MG capsule TAKE 1 CAPSULE BY MOUTH 2 TIMES DAILY. 180 capsule 3   Multiple Vitamin (MULTIVITAMIN WITH MINERALS) TABS tablet Take 1 tablet by mouth daily. (Patient not taking: Reported on 04/16/2023)     pantoprazole (PROTONIX) 40 MG tablet Take 1 tablet (40 mg total) by mouth daily before breakfast. 30 tablet 0   sertraline (ZOLOFT) 50 MG tablet TAKE 1 TABLET BY MOUTH EVERY DAY 90 tablet 3   sildenafil (REVATIO) 20 MG tablet Take 1 tablet (20 mg total) by mouth 3 (three) times daily. 90 tablet 11   warfarin (COUMADIN) 3 MG tablet Take 3 mg (1 tablet) daily or as directed by the advanced heart failure clinic 180 tablet 3   No current facility-administered medications for this visit.    Pacerone [amiodarone] and Percocet [oxycodone-acetaminophen]    Vital Signs:  Doppler Pressure: 100 Automatic BP: 101/85 (92) HR:  75 SPO2: 91% RA   Weight: 174 lb w/ eqt Discharge weight: 181.2 lb w/ eqt   Physical Exam: General:  NAD.  HEENT: normal  Neck: supple. JVP not elevated.  Carotids 2+ bilat; no bruits. No lymphadenopathy or thryomegaly appreciated. Cor: LVAD hum.  Lungs: Clear. Abdomen: obese soft, nontender, non-distended. No hepatosplenomegaly. No bruits or masses. Good bowel sounds. Driveline site clean. Anchor in place.  Extremities: no cyanosis, clubbing, rash. Warm no edema  Neuro: alert & oriented x 3. Minimal LUE weakness Moves all 4 without problem    ASSESSMENT AND PLAN:   1.  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 Presented with VT. LHC 70% LAD  - cMRI 12/21 concerning for sarcoid and EF 18%.  - PET 2/22 at [facility] EF 25% + active sarcoid - Echo 08/26/20 EF <  20% severely dilated LV RV mildly decreased.  - Medtronic CRT-D upgrade in 06/08/21 - Echo 07/10/22: EF <20%, RV okay, mod pericardial effusion, mod Charles/TR - Nonobstructive CAD, severely elevated filling pressures and low Fick CO/CI (2.7/1.4) - 08/29/22 HM III LVAD implant + clipping LAA d/t severe thickening and invagination of mitral valve annulus impeding flows.  - Doing well with VAD support. NYHA I - Volume status ok - Continue spiro   2. HM-3 LVAD - VAD interrogated personally. Parameters stable. Does have frequent PI events but no change. No LOW FLOWs - LDH 212 - DL site ok (on amox) - MAPs look good - Hgb 11.8 -> 12.5 -> 15.9 - INR 3.4. No bleeding  recently. Goal 2.0-3.0. ASA 81 mg daily (on for CVA). Discussed warfarin dosing with PharmD personally.   3.  H/o stroke - Admitted 06/24 w/ R MCA stroke. S/p TPA and mechanical clot extraction. No residual deficits. Likely cardioembolic in setting of severe LV dysfunction. - Developed  left sided weakness 08/14. CTA with R MCA infarct. Taken to IR for thrombectomy - Repeat CT head with small to moderate size hemorrhagic conversion.  - Recovered well with just mild LUE weakness.No change - Continue warfarin/ASA  4. Enterococcus faecalis bacteremia - Bcx 2/2 on 9/6 - ID consult 9/6 -> ampicillin and ceftriaxone - Echo 09/10 - no obvious vegetations - Completed 6 weeks of IV Ampicillin and Ceftriaxone. Now on chronic suppression with Amoxicillin. - DL site looks good today   5. Hx VT - ln setting of potential sarcoid heart disease though core biopsy negative at time iff== - Off amio due to tremor. Continue mexiletine  - now s/p ICD. - No recent VT   6. CAD - LHC 12/07/19 70-% LAD, no intervention - LHC 8/24 non obstructive CAD.  - Continue statin. On aspirin for VAD. - No s/s angina   7. 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  - holding methotrexate w/ recent surgery  and active infection, can discuss timing of restarting the medication at outpatient follow-up - apical core pathology not diagnostic of cardiac sarcoidosis.  - PET negative for sarcoid, Will not restart methotrexate - No change   8. 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 Pulmonary  - PFTs with severe obstructive defect, response to bronchodilator. FEV1 1.04L, FEV1/FVC 48% - stable  10. Insomnia - suggested trial of melatonin  I spent a total of 42 minutes today: 1) reviewing the patient's medical records including previous charts, labs and recent notes from other providers; 2) examining the patient and counseling them on their medical issues/explaining the plan of care; 3) adjusting meds as needed and 4) ordering lab work or other needed tests.    Graylyn Bunney, DO  10:21 PM

## 2023-05-01 ENCOUNTER — Ambulatory Visit (HOSPITAL_COMMUNITY): Payer: Self-pay | Admitting: Pharmacist

## 2023-05-01 ENCOUNTER — Ambulatory Visit (HOSPITAL_COMMUNITY)
Admission: RE | Admit: 2023-05-01 | Discharge: 2023-05-01 | Disposition: A | Discharge status: Home / Self Care | Source: Ambulatory Visit | Attending: Cardiology | Admitting: Cardiology

## 2023-05-01 VITALS — BP 94/0 | HR 66 | Wt 175.0 lb

## 2023-05-01 DIAGNOSIS — Z95811 Presence of heart assist device: Secondary | ICD-10-CM | POA: Insufficient documentation

## 2023-05-01 DIAGNOSIS — R310 Gross hematuria: Secondary | ICD-10-CM

## 2023-05-01 DIAGNOSIS — Z7901 Long term (current) use of anticoagulants: Secondary | ICD-10-CM | POA: Diagnosis not present

## 2023-05-01 DIAGNOSIS — I5022 Chronic systolic (congestive) heart failure: Secondary | ICD-10-CM

## 2023-05-01 DIAGNOSIS — I2510 Atherosclerotic heart disease of native coronary artery without angina pectoris: Secondary | ICD-10-CM

## 2023-05-01 DIAGNOSIS — I480 Paroxysmal atrial fibrillation: Secondary | ICD-10-CM

## 2023-05-01 DIAGNOSIS — Z4801 Encounter for change or removal of surgical wound dressing: Secondary | ICD-10-CM | POA: Diagnosis not present

## 2023-05-01 LAB — BASIC METABOLIC PANEL WITH GFR
Anion gap: 5 (ref 5–15)
BUN: 9 mg/dL (ref 6–20)
CO2: 29 mmol/L (ref 22–32)
Calcium: 9.2 mg/dL (ref 8.9–10.3)
Chloride: 108 mmol/L (ref 98–111)
Creatinine, Ser: 0.99 mg/dL (ref 0.61–1.24)
GFR, Estimated: >60 mL/min (ref >60)
Glucose, Bld: 83 mg/dL (ref 70–99)
Potassium: 4 mmol/L (ref 3.5–5.1)
Sodium: 142 mmol/L (ref 135–145)

## 2023-05-01 LAB — CBC
HCT: 42.6 % (ref 39.0–52.0)
Hemoglobin: 13.5 g/dL (ref 13.0–17.0)
MCH: 30.3 pg (ref 26.0–34.0)
MCHC: 31.7 g/dL (ref 30.0–36.0)
MCV: 95.5 fL (ref 80.0–100.0)
Platelets: 206 10*3/uL (ref 150–400)
RBC: 4.46 MIL/uL (ref 4.22–5.81)
RDW: 18.7 % — ABNORMAL HIGH (ref 11.5–15.5)
WBC: 6.9 10*3/uL (ref 4.0–10.5)
nRBC: 0 % (ref 0.0–0.2)

## 2023-05-01 LAB — PROTIME-INR
INR: 1.6 — ABNORMAL HIGH (ref 0.8–1.2)
Prothrombin Time: 18.8 s — ABNORMAL HIGH (ref 11.4–15.2)

## 2023-05-01 LAB — LACTATE DEHYDROGENASE: LDH: 203 U/L — ABNORMAL HIGH (ref 98–192)

## 2023-05-01 LAB — POCT INR: INR: 1.6 — AB (ref 2.0–3.0)

## 2023-05-01 NOTE — Patient Instructions (Signed)
 No medication changes Coumadin dosing per Barbra Ley PharmD Plan for admission following urology procedure 05/11/23. Be at Admissions by 0700 on 05/11/23. Nothing to eat or drink after midnight.

## 2023-05-01 NOTE — Progress Notes (Signed)
 Pt presents for hosp d/c f/u in VAD Clinic today alone. Reports no problems with VAD equipment or concerns with drive line.   Denies lightheadedness, dizziness, shortness of breath, and heart failure symptoms. Reports continued "cheerwine" colored urine. He reports passing occasional small clots. Denies pain currently, but intermittently experiences burning with urination.   Patient remains on chronic suppressive Amoxicillin 1000 mg BID.   Scheduled for cystoscopy/uteroscopy on 4/25 at 0900. Will admit pt following case. Pt aware he needs to arrive at Admissions at 0700 on 4/25. NPO after MN. INR goal for surgery 1.5 - 2.0. INR 1.6 today. Plan to recheck on Tuesday. Luster Salters PharmD managing INR for surgery.  Vital Signs:  Doppler Pressure: 94 Automatic BP:102/83 (90) HR: 66 SPO2: 95% RA   Weight: 175 lb w/ eqt Discharge weight: 168 lb w/ eqt   VAD Indication: Destination Therapy - sarcoid   LVAD assessment: HM III  Primary Controller: VAD Speed: 5400 rpms Flow: 4.5 Power: 4.1 w    PI: 3.9  Alarms: none Events: 40-50 daily   Fixed speed: 5400 Low speed limit: 5100  Primary controller: back up battery due for replacement in 23 months Secondary controller: back up battery due for replacement in 23 months   I reviewed the LVAD parameters from today and compared the results to the patient's prior recorded data. LVAD interrogation was NEGATIVE for significant power changes, NEGATIVE for clinical alarms and STABLE for PI events/speed drops. No programming changes were made and pump is functioning within specified parameters. Pt is performing daily controller and system monitor self tests along with completing weekly and monthly maintenance for LVAD equipment.   LVAD equipment check completed and is in good working order. Back-up equipment not present at today's visit.   Annual Equipment Maintenance on UBC/PM was performed on 08/29/22.  Exit Site Care:  Existing VAD dressing  removed and site care performed using sterile technique. Drive line exit site cleaned with Vashe x 2, allowed to dry, and Sorbaview dressing with Silverlon patch applied. Exit site healed and incorporated, the velour is fully implanted at exit site. No redness, tenderness, drainage, foul odor or rash noted. Drive line anchor re-applied. Pt denies fever or chills. Pt has adequate dressing supplies at home.     Significant Events on VAD Support:  08/30/22: Acute infarct seen on the right temporal cortex and basal ganglia    Device: Medtronic Therapies: ON Last check: 06/13/22   BP & Labs:  MAP 94 - Doppler is reflecting modified systolic   Hgb 13.5 - No S/S of bleeding. Specifically denies melena/BRBPR or nosebleeds.   LDH 203 with established baseline of 180- 245. Denies tea-colored urine. No power elevations noted on interrogation.    Plan:  No medication changes Coumadin dosing per Barbra Ley PharmD Plan for admission following urology procedure 05/11/23. Be at Admissions by 0700 on 05/11/23. Nothing to eat or drink after midnight.   Paulo Bosworth RN VAD Coordinator  Office: [phone]  24/7 Pager: [phone]

## 2023-05-08 ENCOUNTER — Ambulatory Visit (HOSPITAL_COMMUNITY): Payer: Self-pay | Admitting: Pharmacist

## 2023-05-08 LAB — POCT INR: INR: 2 (ref 2.0–3.0)

## 2023-05-09 ENCOUNTER — Encounter (HOSPITAL_COMMUNITY): Payer: Self-pay | Admitting: Urology

## 2023-05-09 NOTE — Progress Notes (Signed)
 Called and spoke w/ patient ,  completed medication list.  Informed him, he will be received a call from PAT nurse tomorrow to update medical history and be given instructions for day of surgery.  Pt verbalized understanding to be available tomorrow at any time for the phone call.  Pt had asked if was being admitted day before surgery .  Informed patient that he was not being admitted day before surgery that we were just informed this afternoon but that he would be admitted post op.

## 2023-05-09 NOTE — Progress Notes (Addendum)
 This patient is scheduled on Friday , 05-11-2023, for Dr Valeta Gaudier in [facility] OR Room 9.  Patient has a LVAD and AICD .  Patient had surgery few weeks ago in a [facility] OR room previous was told the per LVAD protocol patients are admitted night before surgery and morning of surgery they go to [facility] and this patient had done this.  So , since this patient was on the schedule again,  today I checked patient chart in epic to verify documentation from cardiology/ LVAD clinic or even that office had sent for clearance request for coumadin .  I did not find any documentation that would let me know there had been communication between cardiology/ LVAD clinic and the surgeon office. Called [facility] office , since I did not have Dr Valeta Gaudier phone number,  spoke with triage nurse , Odilia Bennett.  Informed Odilia Bennett that I needed to get in touch with Dr Valeta Gaudier since not knowing if any thing has been set up with this patient for Friday.  Odilia Bennett gave me Dr Valeta Gaudier pager number.  Paged Dr Valeta Gaudier. Received call from Dr Valeta Gaudier.  Asked Dr Valeta Gaudier about this patient if LVAD clinic was aware about his surgery this Friday because I did see any documentation in epic and if she having patient continue coumadin  or stop prior to surgery.  (There was not any communication in special needs in posting about coumadin  or that cardiology/ LVAD clinic was aware.)  Dr Valeta Gaudier stated that LVAD clinic nurse is aware of the surgery this Friday and patient will be admitted post op and his coumadin  is being weaned to get his INR down below 2.0 morning of surgery and coumadin  clinic is aware and have given patient instructions.  I let Dr Valeta Gaudier know that we were unaware of the communication and what had been planned.  Dr Valeta Gaudier gave me LVAD clinic phone # [phone]. Called and spoke w/ Tawny Fate, LVAD clinic nurse.  Introduce who I was and why I was calling.  Tawny Fate stated that yes, normally patients are admitted night prior to surgery but this time this patient is Not being admitted  night before,  he will be admitted post op.  I asked what instructions does patient need to be given.  Patient needs to be npo after midnight and not to take any medication morning of surgery.   Tawny Fate stated may call back any other questions. Called and spoke w/ my supervisor, Charlanne Cong RN.  Informed him of the what is going on. Stated this patient needs to go to Short Stay day of surgery his acuity and patient needs have pre-op completed by [facility] staff.

## 2023-05-10 ENCOUNTER — Other Ambulatory Visit: Payer: Self-pay

## 2023-05-10 ENCOUNTER — Encounter (HOSPITAL_COMMUNITY): Payer: Self-pay | Admitting: Urology

## 2023-05-10 ENCOUNTER — Other Ambulatory Visit (HOSPITAL_COMMUNITY): Payer: Self-pay

## 2023-05-10 NOTE — Progress Notes (Signed)
 PCP - Aloha Arnold, PA-C Cardiologist - Dr Zoe Hinds (clearance on 04/03/23) EP - Dr Agatha Horsfall  Chest x-ray - 09/22/22 EKG - 03/27/23 Stress Test - 09/14/20 ECHO - 04/20/23 Cardiac Cath - 09/12/22  Medtronic LVAD present - call VAD pager [phone] when patient arrives to hospital on DOS.   Medtronic Defibrillator- last device check was on 03/27/23.  Medtronic Rep Cornelius Dill and M. Monty App, RN  was informed with surgery procedure,date and time.  Magnet was recommended on last surgical procedure on 04/17/23.  Dee in [place] was also informed via phone.   Sleep Study -  n/a  Diabetes - n/a  Blood Thinner Instructions:  Hold Coumadin  2 days prior to procedure.  Last dose of Coumadin  was on Thursday, 05/10/23.  Plan is to admit after surgery with Heparin  bridge.  Per MD, Charles Holmes PharmD aware of plan.  Aspirin  Instructions:   NPO  Anesthesia review: Yes  STOP now taking any Aspirin  (unless otherwise instructed by your surgeon), Aleve, Naproxen, Ibuprofen, Motrin, Advil, Goody's, BC's, all herbal medications, fish oil, and all vitamins.   Coronavirus Screening Do you have any of the following symptoms:  Cough yes/no: No Fever (>100.51F)  yes/no: No Runny nose yes/no: No Sore throat yes/no: No Difficulty breathing/shortness of breath  yes/no: No  Have you traveled in the last 14 days and where? yes/no: No  Patient verbalized understanding of instructions that were given via phone.

## 2023-05-10 NOTE — Anesthesia Preprocedure Evaluation (Addendum)
 Anesthesia Evaluation  Patient identified by MRN, date of birth, ID band Patient awake    Reviewed: Allergy & Precautions, NPO status , Patient's Chart, lab work & pertinent test results  Airway Mallampati: III  TM Distance: >3 FB Neck ROM: Full    Dental  (+) Edentulous Upper, Edentulous Lower, Dental Advisory Given   Pulmonary COPD, former smoker   Pulmonary exam normal breath sounds clear to auscultation       Cardiovascular hypertension, + CAD and +CHF  Normal cardiovascular exam+ Cardiac Defibrillator  Rhythm:Regular Rate:Normal  LVAD  TTE 04/20/23: IMPRESSIONS   1. LVAD inflow cannula visualized at LV apex. Left ventricular ejection  fraction, by estimation, is <20%. The left ventricle has severely  decreased function. The left ventricle demonstrates global hypokinesis.  The left ventricular internal cavity size  was mildly dilated. Left ventricular diastolic parameters are  indeterminate. The interventricular septum appears essentially midline.   2. Right ventricular systolic function is moderately reduced. The right  ventricular size is moderately enlarged.   3. Left atrial size was mildly dilated.   4. The mitral valve is normal in structure. No evidence of mitral valve  regurgitation. No evidence of mitral stenosis.   5. The aortic valve does not appear to open. The aortic valve was not  well visualized. Aortic valve regurgitation is not visualized.   6. The inferior vena cava is normal in size with greater than 50%  respiratory variability, suggesting right atrial pressure of 3 mmHg.   7. Technically difficult study with poor acoustic windows.     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  Reproductive/Obstetrics                             Anesthesia Physical Anesthesia Plan  ASA: 4  Anesthesia Plan: General   Post-op Pain Management: Tylenol  PO (pre-op)*   Induction: Intravenous  PONV Risk Score and Plan: 2 and Ondansetron  and Treatment may vary due to age or medical condition  Airway Management Planned: LMA  Additional Equipment: Arterial line  Intra-op Plan:   Post-operative Plan: Extubation in OR  Informed Consent: I have reviewed the patients History and Physical, chart, labs and discussed the procedure including the risks, benefits and alternatives for the proposed anesthesia with the patient or authorized representative who has indicated his/her understanding and acceptance.     Dental advisory given  Plan Discussed with: CRNA  Anesthesia Plan Comments: (PAT note written 05/10/2023 by Allison Zelenak, PA-C. LVAD, Medtronic CRT-D  )       Anesthesia Quick Evaluation

## 2023-05-10 NOTE — H&P (Incomplete)
 Advanced Heart Failure VAD History and Physical Note   PCP-Cardiologist: Zoe Hinds, MD   Reason for Admission: Hematuria  HPI:    Charles Holmes is a 58 y.o. male with history of chronic systolic HF d/t NICM s/p CRT-D and later HM III LVAD w/ left arial appendage clipping 08/24, PAF, VT, HTN, hx CVA.     In 08/24 he underwent LVAD implant  + clipping left atrial appendage d/t severe thickening and invagination of mitral valve annulus impeding flows. Post-op course c/b left-sided hemiplegia. CT head 8/14 with acute R MCA infarct. Taken to IR for thrombectomy. F/u head CT with small hemorrhagic conversion. Had some residual deficits w/ mild L sided weakness but otherwise recovered. Course further c/b enterococcus faecalis bacteremia. Completed 6 week course IV abx and now on chronic suppression with amoxicillin .   He has been dealing with recurrent hematuria for over a month. Directly admitted on 03/31 for cysto and ureteral stent placement on 04/01. INR 2.2 day of procedure despite holding warfarin. Exam difficult d/t significant hematuria. Distal ureter was also narrow and unable to reach right kidney. Stent placed in ureter. Foley then placed and CBI started d/t persistent bleeding. He continued to have hematuria but unable to intervene until ureter dilated. He was discharged and decision made to bring repeat cystoscopy    Presented today for cystoscopy and R urethroscopy. R ureteral biopsy collected. R stent repositioned. Seen patient post procedure. Resting comfortably in bed. INR 1.6.   LVAD INTERROGATION:  HeartMate III LVAD:  Flow 3.9 liters/min, speed 5400, power 3.9, PI 4.6. ~80 PI events today  Home Medications Prior to Admission medications   Medication Sig Start Date End Date Taking? Authorizing Provider  amoxicillin  (AMOXIL ) 500 MG tablet Take 2 tablets (1,000 mg total) by mouth 2 (two) times daily. 02/26/23  Yes Ernie Heal, Jerelyn Money, MD  aspirin  EC 81 MG tablet Take 1 tablet  (81 mg total) by mouth daily. Swallow whole. 10/17/22  Yes Setzer, Hamp Levine, PA-C  atorvastatin  (LIPITOR ) 80 MG tablet Take 1 tablet (80 mg total) by mouth daily. Patient taking differently: Take 80 mg by mouth daily. 10/17/22  Yes Setzer, Sandra J, PA-C  fluticasone-salmeterol (ADVAIR  HFA) 230-21 MCG/ACT inhaler Inhale 2 puffs into the lungs 2 (two) times daily. Patient taking differently: Inhale 2 puffs into the lungs 2 (two) times daily. 12/13/20  Yes Olalere, Adewale A, MD  melatonin 5 MG TABS Take 5 mg by mouth at bedtime.   Yes [provider]  mexiletine (MEXITIL ) 250 MG capsule TAKE 1 CAPSULE BY MOUTH 2 TIMES DAILY. 11/14/22  Yes Bensimhon, Rheta Celestine, MD  nystatin  cream (MYCOSTATIN ) Apply 1 Application topically 2 (two) times daily as needed (itching).   Yes [provider]  pantoprazole  (PROTONIX ) 40 MG tablet Take 1 tablet (40 mg total) by mouth daily before breakfast. Patient taking differently: Take 40 mg by mouth daily before breakfast. 10/17/22  Yes Setzer, Sandra J, PA-C  sertraline  (ZOLOFT ) 50 MG tablet TAKE 1 TABLET BY MOUTH EVERY DAY 12/07/22  Yes Bensimhon, Rheta Celestine, MD  sildenafil  (REVATIO ) 20 MG tablet Take 1 tablet (20 mg total) by mouth 3 (three) times daily. Patient taking differently: Take 20 mg by mouth 3 (three) times daily.  0830/  1630/  2030 02/20/23  Yes Bensimhon, Rheta Celestine, MD  warfarin (COUMADIN ) 3 MG tablet Take 3 mg (1 tablet) daily or as directed by the advanced heart failure clinic Patient taking differently: Take by mouth as directed. Take 3  mg (1 tablet) daily or as directed by the advanced heart failure clinic 04/24/23  Yes Sheryl Donna, NP  Multiple Vitamin (MULTIVITAMIN WITH MINERALS) TABS tablet Take 1 tablet by mouth daily. 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   Stroke (HCC)    Systolic heart failure (HCC) 2021   LVEF 18%, RVEF 38% on cardiac MRI 12/19/2019. possible cardiac sarcoidosis.   Wide-complex tachycardia 2021   wears LifeVest   Past Surgical History: Past Surgical History:  Procedure Laterality Date   BIV UPGRADE N/A 06/07/2021   Procedure: BIV ICD UPGRADE;  Surgeon: Lei Pump, MD;  Location: [facility] INVASIVE CV LAB;  Service: Cardiovascular;  Laterality: N/A;   CLIPPING OF ATRIAL APPENDAGE Left 08/29/2022   Procedure: CLIPPING OF ATRIAL APPENDAGE;  Surgeon: Shon Downing, MD;  Location: [facility] OR;  Service: Open Heart Surgery;  Laterality: Left;   CYSTOSCOPY WITH RETROGRADE PYELOGRAM, URETEROSCOPY AND STENT PLACEMENT Right 04/17/2023   Procedure: CYSTOSCOPY WITH RETROGRADE PYELOGRAM; DIAGNOSTIC RIGHT URETEROSCOPY, RIGHT URETERAL WASHINGS AND STENT INSERTION;  Surgeon: Roxane Copp, MD;  Location: [facility] OR;  Service: Urology;  Laterality: Right;  CYSTOSCOPY, BILATERAL RETROGRADE PYELOGRAM, RIGHT DIAGNOSTIC URETEROSCOPY, POSSIBLE BIOPSY, POSSIBLE STENT, POSSIBLE LEFT DIAGNOSTIC URETERSCOPY, POSSIBLE BIOPSY   IABP INSERTION N/A 08/25/2022   Procedure: IABP Insertion;  Surgeon: Mardell Shade, MD;  Location: [facility] INVASIVE CV LAB;  Service: Cardiovascular;  Laterality: N/A;   ICD IMPLANT N/A 02/20/2020   Procedure: ICD IMPLANT;  Surgeon: Lei Pump, MD;  Location: [facility] INVASIVE CV LAB;  Service: Cardiovascular;  Laterality: N/A;   INSERTION OF IMPLANTABLE LEFT  VENTRICULAR ASSIST DEVICE N/A 08/29/2022   Procedure: INSERTION OF MEDTRONIC IMPLANTABLE LEFT VENTRICULAR ASSIST DEVICE;  Surgeon: Shon Downing, MD;  Location: [facility] OR;  Service: Open Heart Surgery;  Laterality: N/A;   IR CT HEAD LTD  07/10/2022   IR CT HEAD LTD  08/30/2022   IR PERCUTANEOUS ART THROMBECTOMY/INFUSION INTRACRANIAL INC DIAG ANGIO  07/10/2022   IR PERCUTANEOUS ART THROMBECTOMY/INFUSION INTRACRANIAL INC DIAG ANGIO  08/30/2022   IR US  GUIDE VASC ACCESS LEFT  08/30/2022   IR US  GUIDE VASC ACCESS RIGHT  07/10/2022   RADIOLOGY WITH ANESTHESIA N/A 07/10/2022   Procedure: RADIOLOGY WITH ANESTHESIA;  Surgeon: Radiologist, Medication, MD;  Location: [facility] OR;  Service: Radiology;  Laterality: N/A;   RADIOLOGY WITH ANESTHESIA N/A 08/30/2022   Procedure: IR WITH ANESTHESIA;  Surgeon: Luellen Sages, MD;  Location: [facility] OR;  Service: Radiology;  Laterality: N/A;   REMOVAL OF IMPELLA LEFT VENTRICULAR ASSIST DEVICE N/A 08/29/2022   Procedure: REMOVAL OF INTRA-AORTIC BALLON PUMP;  Surgeon: Shon Downing, MD;  Location: [facility] OR;  Service: Open Heart Surgery;  Laterality: N/A;   RIGHT HEART CATH N/A 07/14/2022   Procedure: RIGHT HEART CATH;  Surgeon: Mardell Shade, MD;  Location: [facility] INVASIVE CV LAB;  Service: Cardiovascular;  Laterality: N/A;   RIGHT HEART CATH N/A 08/25/2022   Procedure: RIGHT HEART CATH;  Surgeon: Mardell Shade, MD;  Location: [facility] INVASIVE CV LAB;  Service: Cardiovascular;  Laterality: N/A;   RIGHT HEART CATH N/A 09/12/2022   Procedure: RIGHT HEART CATH;  Surgeon: Darlis Eisenmenger, MD;  Location: [facility] INVASIVE CV LAB;  Service: Cardiovascular;  Laterality: N/A;   RIGHT/LEFT HEART CATH AND CORONARY ANGIOGRAPHY N/A 12/16/2019   Procedure: RIGHT/LEFT HEART CATH AND CORONARY ANGIOGRAPHY;  Surgeon: [place], Peter M, MD;  Location: [facility] INVASIVE CV LAB;  Service: Cardiovascular;  Laterality: N/A;   RIGHT/LEFT HEART CATH AND CORONARY ANGIOGRAPHY N/A 08/10/2022   Procedure:  RIGHT/LEFT HEART CATH AND CORONARY ANGIOGRAPHY;  Surgeon: Mardell Shade, MD;  Location: [facility] INVASIVE CV LAB;  Service: Cardiovascular;  Laterality: N/A;   TEE WITHOUT CARDIOVERSION N/A 08/29/2022   Procedure: TRANSESOPHAGEAL ECHOCARDIOGRAM;  Surgeon: Shon Downing, MD;  Location: [facility] OR;  Service: Open Heart Surgery;  Laterality: N/A;   TOOTH EXTRACTION N/A 08/22/2022   Procedure: DENTAL RESTORATION/EXTRACTIONS;  Surgeon: Ascencion Lava, DMD;  Location: [facility] OR;  Service: Oral Surgery;  Laterality: N/A;   Family History: Family History  Problem Relation Age of Onset   Hypertension Father    Heart disease Father    Heart disease Mother    Social History: Social History   Socioeconomic History   Marital status: Divorced    Spouse name: Not on file   Number of children: Not on file   Years of education: Not on file   Highest education level: Not on file  Occupational History   Not on file  Tobacco Use   Smoking status: Former    Types: Cigarettes   Smokeless tobacco: Never   Tobacco comments:    Quit smoking in 2008  Vaping Use   Vaping status: Never Used  Substance and Sexual Activity   Alcohol  use: Not Currently    Alcohol /week: 6.0 standard drinks of alcohol     Types: 6 Cans of beer per week    Comment: occasional mixed drink   Drug use: Yes    Types: Marijuana    Comment: stopped using months ago d/t how it affected breathing   Sexual activity: Not Currently  Other Topics Concern   Not on file  Social History Narrative   Not on file   Social Drivers of Health   Financial Resource Strain: Low Risk  (07/12/2022)   Overall Financial Resource Strain (CARDIA)    Difficulty of Paying Living Expenses: Not hard at all  Food Insecurity: Food Insecurity Present (04/16/2023)   Hunger Vital Sign    Worried About Running Out of Food in the Last Year: Sometimes true    Ran Out of Food in the Last Year: Often true  Transportation Needs: No Transportation Needs (04/16/2023)    PRAPARE - Administrator, Civil Service (Medical): No    Lack of Transportation (Non-Medical): No  Physical Activity: Not on file  Stress: Not on file  Social Connections: Not on file   Allergies:  Allergies  Allergen Reactions   Pacerone  [Amiodarone ] Other (See Comments)    Severe tremors   Oxycodone  Itching    Pt knows as percocet   Objective:    Vital Signs:   Temp:  [97.9 F (36.6 C)] 97.9 F (36.6 C) (04/25 0737) Pulse Rate:  [40] 40 (04/25 0737) Resp:  [18] 18 (04/25 0737) BP: (106)/(72) 106/72 (04/25  0737) SpO2:  [95 %] 95 % (04/25 0737) Weight:  [77.1 kg-79.4 kg] 77.1 kg (04/25 0737)   Filed Weights   05/10/23 1637 05/11/23 0737  Weight: 79.4 kg 77.1 kg   Mean arterial Pressure 90s  Physical Exam  General:  Well appearing. No resp difficulty Neck: supple. JVP flat Cor: Mechanical heart sounds with LVAD hum present. Lungs: Clear Driveline: C/D/I; securement device intact and driveline incorporated Extremities: no cyanosis, clubbing, rash, edema Neuro: alert & oriented x3. Moves all 4 extremities w/o difficulty. Affect pleasant   Telemetry   NSR 60s + PVCs (Personally reviewed)    Labs    Basic Metabolic Panel: No results for input(s): "NA", "K", "CL", "CO2", "GLUCOSE", "BUN", "CREATININE", "CALCIUM ", "MG", "PHOS" in the last 168 hours.  Liver Function Tests: No results for input(s): "AST", "ALT", "ALKPHOS", "BILITOT", "PROT", "ALBUMIN " in the last 168 hours. No results for input(s): "LIPASE", "AMYLASE" in the last 168 hours. No results for input(s): "AMMONIA" in the last 168 hours.  CBC: No results for input(s): "WBC", "NEUTROABS", "HGB", "HCT", "MCV", "PLT" in the last 168 hours.  Cardiac Enzymes: No results for input(s): "CKTOTAL", "CKMB", "CKMBINDEX", "TROPONINI" in the last 168 hours.  BNP: BNP (last 3 results) Recent Labs    09/11/22 2252 09/18/22 2246 09/25/22 2351  BNP 933.0* 809.0* 851.4*   ProBNP (last 3 results) No  results for input(s): "PROBNP" in the last 8760 hours.  CBG: No results for input(s): "GLUCAP" in the last 168 hours.  Coagulation Studies: No results for input(s): "LABPROT", "INR" in the last 72 hours.  Other results:  Imaging   No results found.  Patient Profile:  Charles Holmes is a 58 y.o. male with history of chronic systolic HF d/t NICM s/p CRT-D and later HM III LVAD w/ left arial appendage clipping 08/24, PAF, VT, HTN, hx CVA.  Assessment/Plan:   Gross hematuria - Gross hematuria started towards the end of March - 04/17/23 Underwent Cystourethroscopy with retrograde pyelogram, ureteral washing and stent insertion - 05/11/23 underwent cystoscopy, ureteroscopy, stent exchange and R retrograde pyelography. Tether of stent left in place.  - Biopsies sent for pathology - Hgb has remained stable. CBC today - Continue warfarin. INR 1.6  2.  Chronic Systolic HF Hx of Cardiogenic Shock  - Diagnosed 11/21. Presented with VT. LHC 70% LAD  - cMRI 12/21 concerning for sarcoid and EF 18%.  - PET 2/22 at [facility] EF 25% + active sarcoid - Medtronic CRT-D upgrade in 06/08/21 - Echo 6/24: EF <20%, RV okay, mod pericardial effusion, mod MR/TR - 08/24 HM III LVAD implant + clipping LAA d/t severe thickening and invagination of mitral valve annulus impeding flows. No evidence of sarcoid on biopsy. - Echo 4/3 EF < 20% VAD placement ok RV moderately HK. Septum midline  - Stable NYHA I, appears euvolemic.  - Check labs today  3. HM-3 LVAD - VAD interrogated personally. Parameters stable. - DL site ok(on amox) - MAPs ok. LDH pending today - INR 1.6. Continue warfarin due to hematuria. INR goal recently lowered to 1.5-2.  - Continue aspirin  (on for hx CVA) - Echo 04/20/23 Septum midline  4.  H/o stroke - Admitted 06/24 w/ R MCA stroke. S/p TPA and mechanical clot extraction. No residual deficits. Likely cardioembolic in setting of severe LV dysfunction. - Developed left sided weakness 08/14. CTA  with R MCA infarct. Taken to IR for thrombectomy - Repeat CT head with small to moderate size hemorrhagic conversion.  - Recovered well with  just mild LUE weakness. No change - Continue ASA   5. Enterococcus faecalis bacteremia - Bcx 2/2 on 9/6 - ID consult 9/6 -> ampicillin  and ceftriaxone  - Echo 09/10 - no obvious vegetations - Completed 6 weeks of IV Ampicillin  and Ceftriaxone . Now on chronic suppression with Amoxicillin . - DL site looks okay  6. Hx VT - ln setting of potential sarcoid heart disease though core biopsy negative at time - Off amio due to tremor. Continue mexiletine  - now s/p ICD. No recent VT  7. CAD - LHC 12/07/19 70-% LAD, no intervention - LHC 8/24 non obstructive CAD.  - Continue statin.  - No s/s angina  8. 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 EF 25% + active sarcoid - Has completed prednisone .  - apical core pathology not diagnostic of cardiac sarcoidosis.  - PET negative for sarcoid, Will not restart methotrexate .  - No change  9. Paroxsymal AT/AF: In SR on tele with frequent PVCs  10. Pulmonary  - PFTs with severe obstructive defect, response to bronchodilator. FEV1 1.04L, FEV1/FVC 48% - stable  I reviewed the LVAD parameters from today, and compared the results to the patient's prior recorded data.  No programming changes were made.  The LVAD is functioning within specified parameters.  The patient performs LVAD self-test daily.  LVAD interrogation was negative for any significant power changes, alarms or PI events/speed drops.  LVAD equipment check completed and is in good working order.  Back-up equipment present.   LVAD education done on emergency procedures and precautions and reviewed exit site care.  Length of Stay: 0  Sheryl Donna, NP 05/11/2023, 8:44 AM  VAD Team Pager [phone] (7am - 7am) +++VAD ISSUES ONLY+++  Advanced Heart Failure Team Pager [phone] (M-F; 7a - 5p)  Please contact [facility] for night-coverage after  hours (5p -7a ) and weekends on [email or web address] for all non- LVAD Issues   Patient seen and examined with the above-signed Advanced Practice Provider and/or Housestaff. I personally reviewed laboratory data, imaging studies and relevant notes. I independently examined the patient and formulated the important aspects of the plan. I have edited the note to reflect any of my changes or salient points. I have personally discussed the plan with the patient and/or family.  Stable post-op after cysto-uteroscopy with stent exchange. Still with gross hematuria  General:  NAD.  HEENT: normal  Neck: supple. JVP not elevated.  Carotids 2+ bilat; no bruits. No lymphadenopathy or thryomegaly appreciated. Cor: LVAD hum.  Lungs: Clear. Abdomen: obese soft, nontender, non-distended. No hepatosplenomegaly. No bruits or masses. Good bowel sounds. Driveline site clean. Anchor in place.  Extremities: no cyanosis, clubbing, rash. Warm no edema  Purewick in place with bloody urine  Neuro: alert & oriented x 3. No focal deficits. Moves all 4 without problem   Stable post-op. Hold anticoagulation for today. Restart warfarin tomorrow if hematuria stable.   Volume ok  VAD interrogated personally. Parameters stable.  Jules Oar, MD  4:17 PM

## 2023-05-10 NOTE — Progress Notes (Signed)
 Anesthesia Chart Review: Charles Holmes  Case: [id number] Date/Time: 05/11/23 0915   Procedure: CYSTOSCOPY/URETEROSCOPY/HOLMIUM LASER/STENT PLACEMENT (Right) - CYSTOSCOPY/RIGHT DIAGNOSTIC URETEROSCOPY/BIOPSY/HOLMIUM LASER/STENT EXCHANGE/RETROGRADE PYELOGRAM   Anesthesia type: General   Diagnosis: Gross hematuria [R31.0]   Pre-op diagnosis: GROSS HEMATURIA   [facility] 09 / [facility]   Surgeons: Roxane Copp, MD       DISCUSSION: Patient is a 58 year old male scheduled for the above procedure. Case was scheduled at [facility], but moved to [facility] OR given cardiac history and LVAD, ICD.   Admitted 04/16/23 - 04/24/23 for gross hematuria. Given LVAD, he was admitted by HF Service. S/p cystoscopy, diagnostic right ureteroscopy, right ureteral stent placement on 04/17/2023. Per Op Note by Dr. Valeta Gaudier, "He will need a second procedure to further evaluate the right kidney. The amount of hematuria seen today would make it difficult to successfully examine his kidney at a later date. 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  He reported last warfarin on 05/09/23. He will get an PT/INR on arrival with any additional labs as indicated.   LVAD team involved with planned admission. EP device recommendations pending, but for last cystoscopy a Magnet was recommended (see 04/03/23 Progress Note).   VS:  Wt Readings from Last 3 Encounters:  05/01/23 79.4 kg  04/24/23 76.3  kg  03/27/23 80.1 kg   BP Readings from Last 3 Encounters:  05/01/23 (!) 94/0  04/24/23 91/80  03/27/23 (!) 104/0   Pulse Readings from Last 3 Encounters:  05/01/23 66  04/24/23 78  03/27/23 71     PROVIDERS: Aloha Arnold, PA-C is PCP  Jules Oar, MD is HF cardiologist Agatha Horsfall, MD is EP cardiologist Ardella Beaver, MD is neurologist  LABS: For day of surgery as indicated. Most recent results in [facility] include: Lab Results  Component Value Date   WBC 6.9 05/01/2023   HGB 13.5 05/01/2023   HCT 42.6 05/01/2023   PLT 206 05/01/2023   GLUCOSE 83 05/01/2023   CHOL 90 08/11/2022   TRIG 64 08/11/2022   HDL 26 (L) 08/11/2022   LDLCALC 51 08/11/2022   ALT 27 04/16/2023   AST 30 04/16/2023   NA 142 05/01/2023   K 4.0 05/01/2023   CL 108 05/01/2023   CREATININE 0.99 05/01/2023   BUN 9 05/01/2023   CO2 29 05/01/2023   TSH 1.211 08/11/2022   INR 2.0 05/08/2023   HGBA1C 5.5 08/11/2022     EKG: 03/27/23: Ventricular-paced rhythm When compared with ECG of 06-Oct-2022 10:53, Vent. rate has decreased BY 9 BPM Confirmed by Pilar Bridge [phone]) on 03/27/2023 8:41:11 AM   CV: TTE 04/20/23: IMPRESSIONS   1. LVAD inflow cannula visualized at LV apex. Left ventricular ejection  fraction, by estimation, is <20%. The left ventricle has severely  decreased function. The left ventricle demonstrates global hypokinesis.  The left ventricular internal cavity size  was mildly dilated. Left ventricular diastolic parameters are  indeterminate. The interventricular septum appears essentially midline.   2. Right ventricular systolic function is  moderately reduced. The right  ventricular size is moderately enlarged.   3. Left atrial size was mildly dilated.   4. The mitral valve is normal in structure. No evidence of mitral valve  regurgitation. No evidence of mitral stenosis.   5. The aortic valve does not appear to open. The aortic valve was not  well visualized. Aortic  valve regurgitation is not visualized.   6. The inferior vena cava is normal in size with greater than 50%  respiratory variability, suggesting right atrial pressure of 3 mmHg.   7. Technically difficult study with poor acoustic windows.    NM PET CT Myocardial Sarcoidosis Study 12/21/22:   FDG uptake findings are inconsistent with active myocardial inflammation/sarcoidosis. The scar burden may be related to infarction vs burn out from sarcoidosis.  Though scar burden is estimated at 76%, the apical contribution of this is likely overestimate post remodeling from LVAD insertion.   FDG uptake was not observed. LV perfusion is abnormal. There is no evidence of ischemia. There is evidence of infarction vs burn out from sarcoidosis.   Defect 1: There is a large defect with severe reduction in uptake present in the apical to basal anterior, anterolateral, anteroseptal, lateral, septal and apex location(s). There is abnormal wall motion in the defect area. Consistent with infarction.   Left ventricular function is abnormal. Global function is severely reduced. EF: 20%. End diastolic cavity size is moderately enlarged. End systolic cavity size is moderately enlarged.   Coronary calcium  was present on the attenuation correction CT images. Moderate coronary calcifications were present. Coronary calcifications were present in the left anterior descending artery and left circumflex artery distribution(s). AICD wires noted.  an LVAD is noted.  These findings are incidentally imaged. A LAA Clip is noted.   RHC 09/12/22: RHC Procedural Findings (milrinone  0.375, epinephrine  4): Hemodynamics (mmHg) RA mean 5 RV 27/6 PA 30/7, mean 16 PCWP mean 4  Oxygen saturations: PA 56% AO 89%  Cardiac Output (Fick) 6.58  Cardiac Index (Fick) 3.49 PVR 1.8 WU  PAPi 4.6   Conclusion: 1. Low filling pressures.  2. Excellent cardiac output on current support.  3. Good PAPi   Will start weaning down on milrinone ,  hopefully will allow more room to wean epinephrine .    LHC 08/10/22:   Prox Cx lesion is 20% stenosed.   Mid LAD lesion is 50% stenosed.   The left ventricular ejection fraction is less than 25% by visual estimate.     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 possible cardiac sarcoidosis.   Wide-complex tachycardia 2021   wears LifeVest    Past Surgical History:  Procedure Laterality Date   BIV UPGRADE N/A 06/07/2021   Procedure: BIV ICD UPGRADE;  Surgeon: Lei Pump, MD;  Location: [facility] INVASIVE CV LAB;  Service: Cardiovascular;  Laterality: N/A;   CLIPPING OF ATRIAL APPENDAGE Left 08/29/2022   Procedure: CLIPPING OF ATRIAL APPENDAGE;  Surgeon: Shon Downing, MD;  [facility];  Service: Open Heart Surgery;  Laterality: Left;   CYSTOSCOPY WITH RETROGRADE PYELOGRAM, URETEROSCOPY AND STENT PLACEMENT Right 04/17/2023   Procedure: CYSTOSCOPY WITH RETROGRADE PYELOGRAM; DIAGNOSTIC RIGHT URETEROSCOPY, RIGHT URETERAL WASHINGS AND STENT INSERTION;  Surgeon: Roxane Copp, MD;  [facility];  Service: Urology;  Laterality: Right;  CYSTOSCOPY, BILATERAL RETROGRADE PYELOGRAM, RIGHT DIAGNOSTIC URETEROSCOPY, POSSIBLE BIOPSY, POSSIBLE STENT, POSSIBLE LEFT DIAGNOSTIC URETERSCOPY, POSSIBLE BIOPSY   IABP INSERTION N/A 08/25/2022   Procedure: IABP Insertion;  Surgeon: Mardell Shade, MD;  [facility] LAB;  Service: Cardiovascular;  Laterality: N/A;   ICD IMPLANT N/A  02/20/2020   Procedure: ICD IMPLANT;  Surgeon: Lei Pump, MD;  Location: [facility] INVASIVE CV LAB;  Service: Cardiovascular;  Laterality: N/A;   INSERTION OF IMPLANTABLE LEFT VENTRICULAR ASSIST DEVICE N/A 08/29/2022   Procedure: INSERTION OF MEDTRONIC IMPLANTABLE LEFT VENTRICULAR ASSIST DEVICE;  Surgeon: Shon Downing, MD;  [facility];  Service: Open Heart Surgery;  Laterality: N/A;   IR CT HEAD LTD  07/10/2022   IR CT HEAD LTD  08/30/2022   IR PERCUTANEOUS ART THROMBECTOMY/INFUSION INTRACRANIAL INC DIAG ANGIO  07/10/2022   IR PERCUTANEOUS ART THROMBECTOMY/INFUSION INTRACRANIAL INC DIAG ANGIO  08/30/2022   IR US  GUIDE VASC ACCESS LEFT  08/30/2022   IR US  GUIDE VASC ACCESS RIGHT  07/10/2022   RADIOLOGY WITH ANESTHESIA N/A 07/10/2022   Procedure: RADIOLOGY WITH ANESTHESIA;  Surgeon: Radiologist, Medication, MD;  [facility];  Service: Radiology;  Laterality: N/A;   RADIOLOGY WITH ANESTHESIA N/A 08/30/2022   Procedure: IR WITH ANESTHESIA;  Surgeon: Luellen Sages, MD;  [facility];  Service: Radiology;  Laterality: N/A;   REMOVAL OF IMPELLA LEFT VENTRICULAR ASSIST DEVICE N/A 08/29/2022   Procedure: REMOVAL OF INTRA-AORTIC BALLON PUMP;  Surgeon: Shon Downing, MD;  [facility];  Service: Open Heart Surgery;  Laterality: N/A;   RIGHT HEART CATH N/A 07/14/2022   Procedure: RIGHT HEART CATH;  Surgeon: Mardell Shade, MD;  [facility];  Service: Cardiovascular;  Laterality: N/A;   RIGHT HEART CATH N/A 08/25/2022   Procedure: RIGHT HEART CATH;  Surgeon: Mardell Shade, MD;  [facility];  Service: Cardiovascular;  Laterality: N/A;   RIGHT HEART CATH N/A 09/12/2022   Procedure: RIGHT HEART CATH;  Surgeon: Darlis Eisenmenger, MD;  Location: [facility]VASIVE CV LAB;  Service: Cardiovascular;  Laterality: N/A;   RIGHT/LEFT HEART CATH AND CORONARY ANGIOGRAPHY N/A 12/16/2019   Procedure: RIGHT/LEFT HEART CATH AND CORONARY ANGIOGRAPHY;   Surgeon: Swazil[place], Peter M, MD;  Location: [facility] INVASIVE CV LAB;  Service: Cardiovascular;  Laterality: N/A;   RIGHT/LEFT HEART CATH AND CORONARY ANGIOGRAPHY N/A 08/10/2022   Procedure: RIGHT/LEFT HEART CATH AND CORONARY ANGIOGRAPHY;  Surgeon: Mardell Shade, MD;  [facility];  Service: Cardiovascular;  Laterality: N/A;   TEE WITHOUT CARDIOVERSION N/A 08/29/2022   Procedure: TRANSESOPHAGEAL ECHOCARDIOGRAM;  Surgeon: Shon Downing, MD;  Location: [facility] OR;  Service: Open Heart Surgery;  Laterality: N/A;   TOOTH EXTRACTION N/A 08/22/2022   Procedure: DENTAL RESTORATION/EXTRACTIONS;  Surgeon: Ascencion Lava, DMD;  [facility];  Service: Oral Surgery;  Laterality: N/A;    MEDICATIONS: No current facility-administered medications for this encounter.    amoxicillin  (AMOXIL ) 500 MG tablet   aspirin  EC 81 MG tablet   atorvastatin  (LIPITOR ) 80 MG tablet   fluticasone-salmeterol (ADVAIR  HFA) 230-21 MCG/ACT inhaler   melatonin 5 MG TABS   mexiletine (MEXITIL ) 250 MG capsule   nystatin   cream (MYCOSTATIN )   pantoprazole  (PROTONIX ) 40 MG tablet   sertraline  (ZOLOFT ) 50 MG tablet   sildenafil  (REVATIO ) 20 MG tablet   warfarin (COUMADIN ) 3 MG tablet   Multiple Vitamin (MULTIVITAMIN WITH MINERALS) TABS tablet    Ella Gun, PA-C Surgical Short Stay/Anesthesiology [facility] Phone [phone] [facility] Phone [phone] 05/10/2023 5:17 PM

## 2023-05-11 ENCOUNTER — Observation Stay (HOSPITAL_COMMUNITY)
Admission: RE | Admit: 2023-05-11 | Discharge: 2023-05-12 | Disposition: A | Discharge status: Home / Self Care | Attending: Cardiology | Admitting: Cardiology

## 2023-05-11 ENCOUNTER — Encounter: Payer: Self-pay | Admitting: Cardiology

## 2023-05-11 ENCOUNTER — Encounter (HOSPITAL_COMMUNITY): Payer: Self-pay | Admitting: Urology

## 2023-05-11 ENCOUNTER — Inpatient Hospital Stay (HOSPITAL_COMMUNITY): Admitting: Vascular Surgery

## 2023-05-11 ENCOUNTER — Inpatient Hospital Stay (HOSPITAL_COMMUNITY)

## 2023-05-11 ENCOUNTER — Other Ambulatory Visit: Payer: Self-pay

## 2023-05-11 ENCOUNTER — Inpatient Hospital Stay (HOSPITAL_BASED_OUTPATIENT_CLINIC_OR_DEPARTMENT_OTHER): Admitting: Vascular Surgery

## 2023-05-11 ENCOUNTER — Encounter (HOSPITAL_COMMUNITY)
Admission: RE | Disposition: A | Discharge status: Home / Self Care | Payer: Self-pay | Source: Home / Self Care | Attending: Cardiology

## 2023-05-11 DIAGNOSIS — R319 Hematuria, unspecified: Secondary | ICD-10-CM | POA: Diagnosis present

## 2023-05-11 DIAGNOSIS — N368 Other specified disorders of urethra: Secondary | ICD-10-CM | POA: Diagnosis not present

## 2023-05-11 DIAGNOSIS — I480 Paroxysmal atrial fibrillation: Secondary | ICD-10-CM | POA: Diagnosis not present

## 2023-05-11 DIAGNOSIS — N401 Enlarged prostate with lower urinary tract symptoms: Secondary | ICD-10-CM | POA: Diagnosis not present

## 2023-05-11 DIAGNOSIS — R310 Gross hematuria: Secondary | ICD-10-CM

## 2023-05-11 DIAGNOSIS — Z960 Presence of urogenital implants: Secondary | ICD-10-CM | POA: Diagnosis not present

## 2023-05-11 DIAGNOSIS — I5022 Chronic systolic (congestive) heart failure: Secondary | ICD-10-CM

## 2023-05-11 DIAGNOSIS — Z79899 Other long term (current) drug therapy: Secondary | ICD-10-CM | POA: Diagnosis not present

## 2023-05-11 DIAGNOSIS — J449 Chronic obstructive pulmonary disease, unspecified: Secondary | ICD-10-CM

## 2023-05-11 DIAGNOSIS — Z8673 Personal history of transient ischemic attack (TIA), and cerebral infarction without residual deficits: Secondary | ICD-10-CM | POA: Insufficient documentation

## 2023-05-11 DIAGNOSIS — I110 Hypertensive heart disease with heart failure: Secondary | ICD-10-CM | POA: Diagnosis not present

## 2023-05-11 DIAGNOSIS — Z7982 Long term (current) use of aspirin: Secondary | ICD-10-CM | POA: Insufficient documentation

## 2023-05-11 DIAGNOSIS — I5023 Acute on chronic systolic (congestive) heart failure: Secondary | ICD-10-CM | POA: Diagnosis not present

## 2023-05-11 DIAGNOSIS — N2889 Other specified disorders of kidney and ureter: Secondary | ICD-10-CM | POA: Diagnosis not present

## 2023-05-11 DIAGNOSIS — Z87891 Personal history of nicotine dependence: Secondary | ICD-10-CM | POA: Diagnosis not present

## 2023-05-11 DIAGNOSIS — Z466 Encounter for fitting and adjustment of urinary device: Secondary | ICD-10-CM | POA: Diagnosis not present

## 2023-05-11 DIAGNOSIS — I2510 Atherosclerotic heart disease of native coronary artery without angina pectoris: Secondary | ICD-10-CM

## 2023-05-11 DIAGNOSIS — I10 Essential (primary) hypertension: Secondary | ICD-10-CM | POA: Diagnosis not present

## 2023-05-11 DIAGNOSIS — N1330 Unspecified hydronephrosis: Secondary | ICD-10-CM | POA: Diagnosis not present

## 2023-05-11 DIAGNOSIS — R350 Frequency of micturition: Secondary | ICD-10-CM | POA: Diagnosis not present

## 2023-05-11 HISTORY — DX: Personal history of urinary calculi: Z87.442

## 2023-05-11 HISTORY — DX: Personal history of other medical treatment: Z92.89

## 2023-05-11 HISTORY — DX: Sarcoid myocarditis: D86.85

## 2023-05-11 HISTORY — PX: CYSTOSCOPY/URETEROSCOPY/HOLMIUM LASER/STENT PLACEMENT: SHX6546

## 2023-05-11 LAB — BASIC METABOLIC PANEL WITH GFR
Anion gap: 10 (ref 5–15)
BUN: 11 mg/dL (ref 6–20)
CO2: 23 mmol/L (ref 22–32)
Calcium: 8.9 mg/dL (ref 8.9–10.3)
Chloride: 108 mmol/L (ref 98–111)
Creatinine, Ser: 1.1 mg/dL (ref 0.61–1.24)
GFR, Estimated: >60 mL/min (ref >60)
Glucose, Bld: 131 mg/dL — ABNORMAL HIGH (ref 70–99)
Potassium: 4 mmol/L (ref 3.5–5.1)
Sodium: 141 mmol/L (ref 135–145)

## 2023-05-11 LAB — CBC
HCT: 35.4 % — ABNORMAL LOW (ref 39.0–52.0)
Hemoglobin: 11.7 g/dL — ABNORMAL LOW (ref 13.0–17.0)
MCH: 31 pg (ref 26.0–34.0)
MCHC: 33.1 g/dL (ref 30.0–36.0)
MCV: 93.9 fL (ref 80.0–100.0)
Platelets: 201 10*3/uL (ref 150–400)
RBC: 3.77 MIL/uL — ABNORMAL LOW (ref 4.22–5.81)
RDW: 16.6 % — ABNORMAL HIGH (ref 11.5–15.5)
WBC: 5.2 10*3/uL (ref 4.0–10.5)
nRBC: 0 % (ref 0.0–0.2)

## 2023-05-11 LAB — LACTATE DEHYDROGENASE: LDH: 155 U/L (ref 98–192)

## 2023-05-11 LAB — PROTIME-INR
INR: 1.6 — ABNORMAL HIGH (ref 0.8–1.2)
Prothrombin Time: 19 s — ABNORMAL HIGH (ref 11.4–15.2)

## 2023-05-11 SURGERY — CYSTOSCOPY/URETEROSCOPY/HOLMIUM LASER/STENT PLACEMENT
Anesthesia: General | Site: Ureter | Laterality: Right

## 2023-05-11 MED ORDER — PHENYLEPHRINE HCL-NACL 20-0.9 MG/250ML-% IV SOLN
INTRAVENOUS | Status: DC | PRN
  Administered 2023-05-11: 30 ug/min via INTRAVENOUS

## 2023-05-11 MED ORDER — SERTRALINE HCL 25 MG PO TABS
50.0000 mg | ORAL_TABLET | Freq: Every day | ORAL | Status: DC
  Administered 2023-05-12: 50 mg via ORAL
  Filled 2023-05-11: qty 2

## 2023-05-11 MED ORDER — AMOXICILLIN 500 MG PO TABS: 1000.0000 mg | ORAL_TABLET | Freq: Two times a day (BID) | ORAL | Status: DC

## 2023-05-11 MED ORDER — ALBUMIN HUMAN 5 % IV SOLN
INTRAVENOUS | Status: AC
Start: 2023-05-11 — End: 2023-05-11
  Filled 2023-05-11: qty 250

## 2023-05-11 MED ORDER — CEFAZOLIN SODIUM-DEXTROSE 2-4 GM/100ML-% IV SOLN
2.0000 g | INTRAVENOUS | Status: AC
Start: 2023-05-11 — End: 2023-05-11
  Administered 2023-05-11: 2 g via INTRAVENOUS
  Filled 2023-05-11: qty 100

## 2023-05-11 MED ORDER — SILDENAFIL CITRATE 20 MG PO TABS
20.0000 mg | ORAL_TABLET | Freq: Three times a day (TID) | ORAL | Status: DC
  Administered 2023-05-11 – 2023-05-12 (×3): 20 mg via ORAL
  Filled 2023-05-11 (×3): qty 1

## 2023-05-11 MED ORDER — ALBUMIN HUMAN 5 % IV SOLN: 12.5000 g | Freq: Once | INTRAVENOUS | Status: AC

## 2023-05-11 MED ORDER — LIDOCAINE HCL URETHRAL/MUCOSAL 2 % EX GEL
CUTANEOUS | Status: AC
  Filled 2023-05-11: qty 11

## 2023-05-11 MED ORDER — PANTOPRAZOLE SODIUM 40 MG PO TBEC
40.0000 mg | DELAYED_RELEASE_TABLET | Freq: Every day | ORAL | Status: DC
  Administered 2023-05-12: 40 mg via ORAL
  Filled 2023-05-11: qty 1

## 2023-05-11 MED ORDER — PHENYLEPHRINE 80 MCG/ML (10ML) SYRINGE FOR IV PUSH (FOR BLOOD PRESSURE SUPPORT)
PREFILLED_SYRINGE | INTRAVENOUS | Status: DC | PRN
  Administered 2023-05-11 (×2): 80 ug via INTRAVENOUS
  Administered 2023-05-11: 160 ug via INTRAVENOUS

## 2023-05-11 MED ORDER — ATORVASTATIN CALCIUM 80 MG PO TABS
80.0000 mg | ORAL_TABLET | Freq: Every day | ORAL | Status: DC
  Administered 2023-05-12: 80 mg via ORAL
  Filled 2023-05-11: qty 1

## 2023-05-11 MED ORDER — LIDOCAINE 2% (20 MG/ML) 5 ML SYRINGE
INTRAMUSCULAR | Status: DC | PRN
  Administered 2023-05-11: 60 mg via INTRAVENOUS

## 2023-05-11 MED ORDER — PROPOFOL 10 MG/ML IV BOLUS
INTRAVENOUS | Status: AC
  Filled 2023-05-11: qty 20

## 2023-05-11 MED ORDER — ONDANSETRON HCL 4 MG/2ML IJ SOLN
INTRAMUSCULAR | Status: DC | PRN
  Administered 2023-05-11: 4 mg via INTRAVENOUS

## 2023-05-11 MED ORDER — WARFARIN - PHARMACIST DOSING INPATIENT: Freq: Every day | Status: DC

## 2023-05-11 MED ORDER — DEXAMETHASONE SODIUM PHOSPHATE 10 MG/ML IJ SOLN
INTRAMUSCULAR | Status: DC | PRN
  Administered 2023-05-11: 5 mg via INTRAVENOUS

## 2023-05-11 MED ORDER — TRAMADOL HCL 50 MG PO TABS
50.0000 mg | ORAL_TABLET | Freq: Four times a day (QID) | ORAL | Status: DC | PRN
  Administered 2023-05-11: 50 mg via ORAL
  Filled 2023-05-11: qty 1

## 2023-05-11 MED ORDER — ASPIRIN 81 MG PO TBEC
81.0000 mg | DELAYED_RELEASE_TABLET | Freq: Every day | ORAL | Status: DC
  Administered 2023-05-12: 81 mg via ORAL
  Filled 2023-05-11: qty 1

## 2023-05-11 MED ORDER — MELATONIN 5 MG PO TABS
5.0000 mg | ORAL_TABLET | Freq: Every day | ORAL | Status: DC
  Administered 2023-05-11: 5 mg via ORAL
  Filled 2023-05-11: qty 1

## 2023-05-11 MED ORDER — AMOXICILLIN 500 MG PO CAPS
1000.0000 mg | ORAL_CAPSULE | Freq: Two times a day (BID) | ORAL | Status: DC
  Administered 2023-05-11 – 2023-05-12 (×2): 1000 mg via ORAL
  Filled 2023-05-11 (×2): qty 2

## 2023-05-11 MED ORDER — LACTATED RINGERS IV SOLN: INTRAVENOUS | Status: DC

## 2023-05-11 MED ORDER — CHLORHEXIDINE GLUCONATE 0.12 % MT SOLN: 15.0000 mL | Freq: Once | OROMUCOSAL | Status: AC

## 2023-05-11 MED ORDER — FENTANYL CITRATE (PF) 250 MCG/5ML IJ SOLN
INTRAMUSCULAR | Status: DC | PRN
  Administered 2023-05-11: 50 ug via INTRAVENOUS

## 2023-05-11 MED ORDER — FENTANYL CITRATE (PF) 250 MCG/5ML IJ SOLN
INTRAMUSCULAR | Status: AC
  Filled 2023-05-11: qty 5

## 2023-05-11 MED ORDER — POLYVINYL ALCOHOL 1.4 % OP SOLN
1.0000 [drp] | OPHTHALMIC | Status: DC | PRN
  Administered 2023-05-11 – 2023-05-12 (×2): 1 [drp] via OPHTHALMIC
  Filled 2023-05-11: qty 15

## 2023-05-11 MED ORDER — ADULT MULTIVITAMIN W/MINERALS CH
1.0000 | ORAL_TABLET | Freq: Every day | ORAL | Status: DC
  Administered 2023-05-12: 1 via ORAL
  Filled 2023-05-11: qty 1

## 2023-05-11 MED ORDER — SODIUM CHLORIDE 0.9 % IR SOLN
Status: DC | PRN
  Administered 2023-05-11: 3000 mL

## 2023-05-11 MED ORDER — PROPOFOL 10 MG/ML IV BOLUS
INTRAVENOUS | Status: DC | PRN
  Administered 2023-05-11: 50 mg via INTRAVENOUS
  Administered 2023-05-11: 20 mg via INTRAVENOUS
  Administered 2023-05-11: 30 mg via INTRAVENOUS

## 2023-05-11 MED ORDER — MEXILETINE HCL 250 MG PO CAPS
250.0000 mg | ORAL_CAPSULE | Freq: Two times a day (BID) | ORAL | Status: DC
  Administered 2023-05-11 – 2023-05-12 (×2): 250 mg via ORAL
  Filled 2023-05-11 (×3): qty 1

## 2023-05-11 MED ORDER — ORAL CARE MOUTH RINSE: 15.0000 mL | Freq: Once | OROMUCOSAL | Status: AC

## 2023-05-11 MED ORDER — ACETAMINOPHEN 325 MG PO TABS: 650.0000 mg | ORAL_TABLET | ORAL | Status: DC | PRN

## 2023-05-11 MED ORDER — CHLORHEXIDINE GLUCONATE 0.12 % MT SOLN
OROMUCOSAL | Status: AC
  Administered 2023-05-11: 15 mL via OROMUCOSAL
  Filled 2023-05-11: qty 15

## 2023-05-11 MED ORDER — WARFARIN SODIUM 3 MG PO TABS: 3.0000 mg | ORAL_TABLET | Freq: Once | ORAL | Status: DC

## 2023-05-11 MED ORDER — IOHEXOL 300 MG/ML  SOLN
INTRAMUSCULAR | Status: DC | PRN
  Administered 2023-05-11: 40 mL

## 2023-05-11 SURGICAL SUPPLY — 21 items
BAG URO CATCHER STRL LF (MISCELLANEOUS) ×1 IMPLANT
BASKET ZERO TIP NITINOL 2.4FR (BASKET) IMPLANT
CATH URETL OPEN 5X70 (CATHETERS) ×1 IMPLANT
CLOTH BEACON ORANGE TIMEOUT ST (SAFETY) ×1 IMPLANT
DRSG TEGADERM 4X4.75 (GAUZE/BANDAGES/DRESSINGS) IMPLANT
EXTRACTOR STONE 1.7FRX115CM (UROLOGICAL SUPPLIES) IMPLANT
GLOVE BIO SURGEON STRL SZ 6.5 (GLOVE) ×1 IMPLANT
GOWN STRL REUS W/ TWL LRG LVL3 (GOWN DISPOSABLE) ×1 IMPLANT
GUIDEWIRE STR DUAL SENSOR (WIRE) ×1 IMPLANT
KIT TURNOVER KIT B (KITS) ×1 IMPLANT
MANIFOLD NEPTUNE II (INSTRUMENTS) ×1 IMPLANT
PACK CYSTO (CUSTOM PROCEDURE TRAY) ×1 IMPLANT
SHEATH NAVIGATOR HD 11/13X28 (SHEATH) IMPLANT
SHEATH NAVIGATOR HD 11/13X36 (SHEATH) IMPLANT
SHEATH NAVIGATOR HD 12/14X46 (SHEATH) IMPLANT
SLEEVE SCD COMPRESS KNEE MED (STOCKING) ×1 IMPLANT
SOL .9 NS 3000ML IRR UROMATIC (IV SOLUTION) ×1 IMPLANT
STENT URET 6FRX26 CONTOUR (STENTS) IMPLANT
TRACTIP FLEXIVA PULS ID 200XHI (Laser) IMPLANT
TUBE CONNECTING 12X1/4 (SUCTIONS) ×1 IMPLANT
TUBING UROLOGY SET (TUBING) ×1 IMPLANT

## 2023-05-11 NOTE — Progress Notes (Addendum)
 VAD Coordinator Procedure Note:   VAD Coordinator met patient in OR. Pt undergoing cystoscopy/uteroscopy/retrograde pyelogram per Dr. Valeta Gaudier. Hemodynamics and VAD parameters monitored by myself and anesthesia throughout the procedure. Blood pressures were obtained with automatic cuff on left arm and correlated with right radial arterial line.     Time: Doppler Arterial BP Flow PI Power Speed  Pre-procedure:  1010  96/85 (91) 4.0 5.0 3.9 5400                    Sedation Induction: 1015  94/82 (90) 4.0 5.4 3.9 5400   1030  100/77 (94) 3.0 4.0 3.7 5400   1045  98/77 (94) 3.4 2.9 3.8 5400   1100  100/77 (93) 3.5 4.4 3.8 5400   1115  97/84 (90) 3.6 5.8 3.9 5400  Recovery Area: 1130  106/89 (95) 4.0 5.0 4.0 5400   1145  100/88 (94) 4.1 4.9 4.0 5400    Patient Disposition: Patient tolerated the procedure well. VAD Coordinator accompanied and remained with patient in recovery area.   Updated Dr Julane Ny, Dr Alease Amend, Dennise Fitz NP, and [place] Lee NP on procedure.   Per Dr Valeta Gaudier OP note: The tether of the stent was left on and secured to the ventral aspect of the patient's penis. Will wait for pathology results prior to giving instructions to remove stent.    Paulo Bosworth RN VAD Coordinator  Office: [phone]  24/7 Pager: [phone]

## 2023-05-11 NOTE — Anesthesia Procedure Notes (Signed)
 Procedure Name: LMA Insertion Date/Time: 05/11/2023 10:15 AM  Performed by: Colen Daunt, CRNAPre-anesthesia Checklist: Patient identified, Emergency Drugs available, Suction available and Patient being monitored Patient Re-evaluated:Patient Re-evaluated prior to induction Oxygen Delivery Method: Circle System Utilized Preoxygenation: Pre-oxygenation with 100% oxygen Induction Type: IV induction Ventilation: Mask ventilation without difficulty LMA: LMA inserted LMA Size: 4.0 Number of attempts: 1 Placement Confirmation: positive ETCO2 Tube secured with: Tape Dental Injury: Teeth and Oropharynx as per pre-operative assessment

## 2023-05-11 NOTE — Op Note (Signed)
 Preoperative diagnosis: gross hematuria, bloody efflux right ureteral orifice  Postoperative diagnosis: gross hematuria, bloody efflux right ureteral orifice  Procedure:  Cystoscopy Diagnostic right ureteroscopy right 66F x 24 ureteral stent exchange right retrograde pyelography with interpretation  Surgeon: Perley Bradley, MD  Anesthesia: General  Complications: None  Intraoperative findings:  Normal urethra Bilateral lobe hypertrophy prostatic urethra Bilateral orthotropic ureteral orifices right retrograde pyelography demonstrated normal calyces with irregular density material in renal pelvis consistent with blood clot seen under direct visualization Bladder mucosa normal without masses   EBL: Minimal  Specimens: right ureteral biopsy  Disposition of specimens: [facility] for stone analysis  Indication: Charles Holmes is a 58 y.o.   patient with gross hematuria found to have bloody efflux from right ureteral orifice.  He previously underwent right ureteral stent placement after the ureter was too narrow to access with the ureteroscope.  He now returns for second procedure for diagnostic ureteroscopy.  After reviewing the management options for treatment, the patient elected to proceed with the above surgical procedure(s). We have discussed the potential benefits and risks of the procedure, side effects of the proposed treatment, the likelihood of the patient achieving the goals of the procedure, and any potential problems that might occur during the procedure or recuperation. Informed consent has been obtained.   Description of procedure:  The patient was taken to the operating room and general anesthesia was induced.  The patient was placed in the dorsal lithotomy position, prepped and draped in the usual sterile fashion, and preoperative antibiotics were administered. A preoperative time-out was performed.   Cystourethroscopy was performed.  The patient's  urethra was examined and demonstrated bilobar prostatic hypertrophy.  Patient had significant gross hematuria limiting visibility and bladder.  The right ureteral stent was seen emanating from the right ureteral orifice.  Graspers were used to bring the existing ureteral stent to the urethral meatus.  A 0.38 sensor wire was then used to cannulate the ureteral stent and it was advanced to the kidney with fluoroscopic guidance.  The stent was removed.  The wire was secured as a safety wire.    Semirigid ureteroscopy then took place alongside the wire.  The distal and mid ureter were noted to be normal.  There was sloughing ureteral mucosa in the proximal ureter.  Visualization was again quite poor due to the amount of blood.  A second sensor wire was placed through the semirigid ureteroscope and advanced to the kidney with fluoroscopic guidance.  The ureteroscope was removed.  A ureteral access sheath was placed over the second wire and advanced to the mid ureter with fluoroscopic guidance.  The inner sheath and wire removed.  Flexible ureteroscopy then took place.  There was some shaggy appearing irregular mucosa at the UPJ.  This area was then biopsied with biopsy forceps through the ureteroscope.  A retrograde pyelogram was then obtained through the ureteroscope.  There was no filling defect noted in the calyces.  There are some irregular material noted in the renal pelvis consistent with blood clot seen.  Diagnostic pyeloscopy continued however hematuria was significant.  No obvious urothelial tumor was seen.  The ureteroscope was then removed and using with the access sheath taking care examine the ureter on the way out.  There is no injury or trauma noted to the ureter.  The wire was then backloaded through the cystoscope and a ureteral stent was advance over the wire using Seldinger technique.  The stent was positioned appropriately under fluoroscopic and  cystoscopic guidance.  The wire was then removed  with an adequate stent curl noted in the renal pelvis as well as in the bladder.  The bladder was then emptied and the procedure ended.  The patient appeared to tolerate the procedure well and without complications.  The patient was able to be awakened and transferred to the recovery unit in satisfactory condition.   Disposition: The tether of the stent was left on and secured to the ventral aspect of the patient's penis.  Will wait for pathology results prior to giving instructions to remove stent.

## 2023-05-11 NOTE — Transfer of Care (Signed)
 Immediate Anesthesia Transfer of Care Note  Patient: Charles Holmes  Procedure(s) Performed: CYSTOSCOPY/URETEROSCOPY/STENT PLACEMENT (Right: Ureter)  Patient Location: PACU  Anesthesia Type:General  Level of Consciousness: awake and sedated  Airway & Oxygen Therapy: Patient Spontanous Breathing and Patient connected to face mask oxygen  Post-op Assessment: Report given to RN and Post -op Vital signs reviewed and stable  Post vital signs: Reviewed and stable  Last Vitals:  Vitals Value Taken Time  BP 106/89 05/11/23 1130  Temp    Pulse    Resp 11 05/11/23 1135  SpO2    Vitals shown include unfiled device data.  Last Pain:  Vitals:   05/11/23 0819  TempSrc:   PainSc: 4          Complications: No notable events documented.

## 2023-05-11 NOTE — Progress Notes (Signed)
 LVAD Coordinator Rounding Note:  Admitted 05/11/23 following cystouteroscopy with Dr Valeta Gaudier.  HM 3 LVAD implanted on 08/29/22 by Dr Matt Song under destination therapy criteria.   Cystourethroscopy with retrograde pyelogram, ureteral washing and stent insertion 04/17/23. Repeat procedure completed 05/11/23. See Dr Harman Lightning OP note for details. Surgical pathology pending.   Met pt in OR holding. Denies complaints this morning. Continues to have hematuria.   Vital signs: Temp:  HR: 62 Doppler Pressure: not documented Automatic BP: 96/85 (91) O2 Sat: 96% on RA Wt: 170 lbs   LVAD interrogation reveals:  Speed: 5400 Flow: 4.0 Power: 3.9 w PI: 5.0  Alarms: none Events: 30 - 40 PI events daily  Hematocrit: 41 Fixed speed: 5400 Low speed limit: 5100  Drive Line:  Existing VAD dressing clean, dry, and intact. Existing VAD dressing removed and site care performed using sterile technique. Drive line exit site cleaned with betadine  moistened gauze x 2, allowed to dry, and Sorbaview dressing with gauze patch applied. Exit site healed and incorporated, the velour is fully implanted at exit site. No redness, tenderness, drainage, or foul odor noted. Healing rash area noted around previous dressing. Drive line anchor re-applied. Pt denies fever or chills. Continue weekly drive line dressing changes by bedside RN. Next dressing change 05/18/23.  Labs:  Hgb trend:  LDH trend:   INR trend: 1.6  Anticoagulation Plan: -INR Goal: 1.5-2 -ASA Dose: 81 mg   - Coumadin  dosing per pharmacy   Blood Products:   Device: Medtronic BiV -Therapies: ON VF ON >207 BPM FVT ON 176-207 BPM VT ON 171-207 BPM  Adverse Events on VAD: - 08/30/22: - Developed left sided weakness this am. CTA with R MCA infarct. Taken to IR for thrombectomy   Plan/Recommendations:  Please page VAD coordinator for any alarms or VAD equipment issues. Weekly drive line dressings per bedside RN  Paulo Bosworth RN VAD Coordinator   Office: [phone]  24/7 Pager: [phone]

## 2023-05-11 NOTE — Discharge Instructions (Addendum)
 DISCHARGE INSTRUCTIONS FOR KIDNEY STONE/URETERAL STENT   MEDICATIONS:  1. Resume all your other meds from home  2. AZO over the counter can help with the burning/stinging when you urinate. 3. Tramadol  is for moderate/severe pain, otherwise taking up to 1000 mg every 6 hours of plainTylenol will help treat your pain.      ACTIVITY:  1. No strenuous activity x 1week  2. No driving while on narcotic pain medications  3. Drink plenty of water   4. Continue to walk at home - you can still get blood clots when you are at home, so keep active, but don't over do it.  5. May return to work/school tomorrow or when you feel ready   BATHING:  1. You can shower and we recommend daily showers  2. You have a string coming from your urethra: The stent string is attached to your ureteral stent. Do not pull on this.   SIGNS/SYMPTOMS TO CALL:  Please call us  if you have a fever greater than 101.5, uncontrolled nausea/vomiting, uncontrolled pain, dizziness, unable to urinate, bloody urine, chest pain, shortness of breath, leg swelling, leg pain, redness around wound, drainage from wound, or any other concerns or questions.   You can reach us  at [phone].   FOLLOW-UP:  1. Stent removal will be determine after pathology results return.

## 2023-05-11 NOTE — Progress Notes (Signed)
 PERIOPERATIVE PRESCRIPTION FOR IMPLANTED CARDIAC DEVICE PROGRAMMING  Patient Information: Name:  Charles Holmes  DOB:  Jun 10, 1965  MRN:  [id number]     Planned Procedure:  CYSTOSCOPY/URETEROSCOPY/HOLMIUM LASER/STENT PLACEMENT - Right  Surgeon:  Dr Perley Bradley  Date of Procedure:  05/11/23  Cautery will be used.  Position during surgery:  supine   Please send documentation back to:  [facility] (Fax # [phone])   Device Information:  Clinic EP Physician:  Agatha Horsfall, MD   Device Type:  Defibrillator Manufacturer and Phone #:  Medtronic: [id number] Pacemaker Dependent?:  No. Date of Last Device Check:  03/27/2023 - In office Normal Device Function?:  Yes.    Electrophysiologist's Recommendations:  Have magnet available. Provide continuous ECG monitoring when magnet is used or reprogramming is to be performed.  Procedure may interfere with device function.  Magnet should be placed over device during procedure.  Per Device Clinic [address] [place], [place]  11:29 AM 05/11/2023

## 2023-05-11 NOTE — Progress Notes (Signed)
 PHARMACY - ANTICOAGULATION CONSULT NOTE  Pharmacy Consult for warfarin Indication:  HM3 LVAD  Allergies  Allergen Reactions   Pacerone  [Amiodarone ] Other (See Comments)    Severe tremors   Oxycodone  Itching    Pt knows as percocet    Patient Measurements: Height: 5\' 7"  (170.2 cm) Weight: 77.1 kg (170 lb) IBW/kg (Calculated) : 66.1 HEPARIN  DW (KG): 77.1  Vital Signs: Temp: 97.8 F (36.6 C) (04/25 1234) Temp Source: Oral (04/25 1234) BP: 104/74 (04/25 1234) Pulse Rate: 92 (04/25 1234)  Labs: Recent Labs    05/11/23 0855  LABPROT 19.0*  INR 1.6*    Estimated Creatinine Clearance: 75.1 mL/min (by C-G formula based on SCr of 0.99 mg/dL).   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   Stroke (HCC)    Systolic heart failure (HCC) 2021   LVEF 18%, RVEF 38% on cardiac MRI 12/19/2019. possible cardiac sarcoidosis.   Wide-complex tachycardia 2021   wears LifeVest    Medications:  Scheduled:   amoxicillin   1,000 mg Oral Q12H   [START ON 05/12/2023] aspirin  EC  81 mg Oral Daily   [START ON 05/12/2023] atorvastatin   80 mg Oral Daily   melatonin  5 mg Oral QHS   mexiletine  250 mg Oral BID   [START ON 05/12/2023] multivitamin with minerals  1 tablet Oral Daily   [START ON 05/12/2023] pantoprazole   40 mg Oral QAC breakfast   [START ON 05/12/2023] sertraline   50 mg Oral Daily   sildenafil   20 mg Oral TID     Assessment: 14 yom presenting for secondary urology procedure given gross hematuria and bloody efflux of R ureteral orifice. Of note, has LVAD HM3 on warfarin PTA (LD 4/24). Has reduced INR goal given hematuria - PTA regimen is 3 mg daily.   Underwent cystoscopy with R ureteral stent exchange on 4/25. INR is therapeutic at 1.6 today. No s/sx of bleeding outside of known hematuria.   Goal of Therapy:  INR 1.5-2 Monitor platelets by anticoagulation protocol: Yes   Plan:  Warfarin 3 mg tonight Monitor daily INR, CBC, and for s/sx of bleeding   Thank you for allowing pharmacy to participate in this patient's care,  Nieves Bars, PharmD, BCCCP Clinical Pharmacist  Phone: [phone] 05/11/2023 2:25 PM  Please check AMION for all [facility] Pharmacy phone numbers After 10:00 PM, call Main Pharmacy [phone]

## 2023-05-11 NOTE — Interval H&P Note (Signed)
 History and Physical Interval Note: Patient underwent diagnostic ureteroscopy on 3/31.  Due to narrow ureter and amount of hematuria unable to access kidney.  He has had stent in place now since then and returns for repeat diagnostic ureteroscopy.  He will be comanaged with the heart failure team.  INR to be checked this morning.  He is tolerating the stent okay but still has persistent hematuria.  05/11/2023 8:36 AM  Charles Holmes  has presented today for surgery, with the diagnosis of GROSS HEMATURIA.  The various methods of treatment have been discussed with the patient and family. After consideration of risks, benefits and other options for treatment, the patient has consented to  Procedure(s) with comments: CYSTOSCOPY/URETEROSCOPY/HOLMIUM LASER/STENT PLACEMENT (Right) - CYSTOSCOPY/RIGHT DIAGNOSTIC URETEROSCOPY/BIOPSY/HOLMIUM LASER/STENT EXCHANGE/RETROGRADE PYELOGRAM as a surgical intervention.  The patient's history has been reviewed, patient examined, no change in status, stable for surgery.  I have reviewed the patient's chart and labs.  Questions were answered to the patient's satisfaction.     Providencia Hottenstein D Raahim Shartzer

## 2023-05-11 NOTE — Plan of Care (Signed)
  Problem: Cardiac: Goal: LVAD will function as expected and patient will experience no clinical alarms Outcome: Progressing   Problem: Education: Goal: Knowledge of General Education information will improve Description: Including pain rating scale, medication(s)/side effects and non-pharmacologic comfort measures Outcome: Progressing   Problem: Health Behavior/Discharge Planning: Goal: Ability to manage health-related needs will improve Outcome: Progressing   Problem: Clinical Measurements: Goal: Ability to maintain clinical measurements within normal limits will improve Outcome: Progressing Goal: Cardiovascular complication will be avoided Outcome: Progressing   Problem: Nutrition: Goal: Adequate nutrition will be maintained Outcome: Progressing   Problem: Coping: Goal: Level of anxiety will decrease Outcome: Progressing   Problem: Safety: Goal: Ability to remain free from injury will improve Outcome: Progressing   Problem: Pain Managment: Goal: General experience of comfort will improve and/or be controlled Outcome: Progressing

## 2023-05-12 DIAGNOSIS — Z79899 Other long term (current) drug therapy: Secondary | ICD-10-CM | POA: Diagnosis not present

## 2023-05-12 DIAGNOSIS — R350 Frequency of micturition: Secondary | ICD-10-CM | POA: Diagnosis not present

## 2023-05-12 DIAGNOSIS — Z8673 Personal history of transient ischemic attack (TIA), and cerebral infarction without residual deficits: Secondary | ICD-10-CM | POA: Diagnosis not present

## 2023-05-12 DIAGNOSIS — N368 Other specified disorders of urethra: Secondary | ICD-10-CM | POA: Diagnosis not present

## 2023-05-12 DIAGNOSIS — R310 Gross hematuria: Secondary | ICD-10-CM | POA: Diagnosis not present

## 2023-05-12 DIAGNOSIS — Z7982 Long term (current) use of aspirin: Secondary | ICD-10-CM | POA: Diagnosis not present

## 2023-05-12 DIAGNOSIS — Z87891 Personal history of nicotine dependence: Secondary | ICD-10-CM | POA: Diagnosis not present

## 2023-05-12 DIAGNOSIS — R319 Hematuria, unspecified: Secondary | ICD-10-CM | POA: Diagnosis present

## 2023-05-12 LAB — BASIC METABOLIC PANEL WITH GFR
Anion gap: 10 (ref 5–15)
BUN: 13 mg/dL (ref 6–20)
CO2: 25 mmol/L (ref 22–32)
Calcium: 9.1 mg/dL (ref 8.9–10.3)
Chloride: 107 mmol/L (ref 98–111)
Creatinine, Ser: 1.01 mg/dL (ref 0.61–1.24)
GFR, Estimated: >60 mL/min (ref >60)
Glucose, Bld: 135 mg/dL — ABNORMAL HIGH (ref 70–99)
Potassium: 4.3 mmol/L (ref 3.5–5.1)
Sodium: 142 mmol/L (ref 135–145)

## 2023-05-12 LAB — PROTIME-INR
INR: 1.4 — ABNORMAL HIGH (ref 0.8–1.2)
Prothrombin Time: 17.5 s — ABNORMAL HIGH (ref 11.4–15.2)

## 2023-05-12 LAB — CBC
HCT: 35.5 % — ABNORMAL LOW (ref 39.0–52.0)
Hemoglobin: 11.6 g/dL — ABNORMAL LOW (ref 13.0–17.0)
MCH: 31.5 pg (ref 26.0–34.0)
MCHC: 32.7 g/dL (ref 30.0–36.0)
MCV: 96.5 fL (ref 80.0–100.0)
Platelets: 201 10*3/uL (ref 150–400)
RBC: 3.68 MIL/uL — ABNORMAL LOW (ref 4.22–5.81)
RDW: 16.7 % — ABNORMAL HIGH (ref 11.5–15.5)
WBC: 10.1 10*3/uL (ref 4.0–10.5)
nRBC: 0 % (ref 0.0–0.2)

## 2023-05-12 LAB — LACTATE DEHYDROGENASE: LDH: 161 U/L (ref 98–192)

## 2023-05-12 MED ORDER — WARFARIN - PHARMACIST DOSING INPATIENT
Freq: Every day | Status: DC
Start: 2023-05-12 — End: 2023-05-12

## 2023-05-12 MED ORDER — WARFARIN SODIUM 3 MG PO TABS: 3.0000 mg | ORAL_TABLET | Freq: Once | ORAL | Status: DC

## 2023-05-12 NOTE — Discharge Summary (Signed)
 Advanced Heart Failure Team  Discharge Summary   Patient ID: Charles Holmes MRN: [id number], DOB/AGE: 1966-01-03 58 y.o. Admit date: 05/11/2023 D/C date:     05/12/2023   Primary Discharge Diagnoses:  Systolic heart failure s/p HM3  Secondary Discharge Diagnoses:    Hospital Course:    He has been dealing with recurrent hematuria for over a month. Directly admitted on 03/31 for cysto and ureteral stent placement on 04/01. INR 2.2 day of procedure despite holding warfarin. Exam difficult d/t significant hematuria. Distal ureter was also narrow and unable to reach right kidney. Stent placed in ureter. Foley then placed and CBI started d/t persistent bleeding. He continued to have hematuria but unable to intervene until ureter dilated. He was discharged and decision made to bring repeat cystoscopy   Patient underwent cystoscopy and R urethroscopy on 4/25, biopsy was performed and stent repositioned. Patient did well post procedure. Some increased hematuria so his warfarin was help.   The morning of discharge his hematuria had improved and patient was overall feeling well. No issues with pain or discomfort, his hematuria was back to baseline. Plan for discharge with urology follow up. Will resume on home medications.   Exam: General:  Well appearing. No resp difficulty Cor: Mechanical heart sounds with LVAD hum present. JVP flat, Trace LE edema Lungs: Normal WOB Abdomen: soft, nontender, nondistended.  Driveline: C/D/I; securement device intact and driveline incorporated Neuro: alert & orientedx3, cranial nerves grossly intact. moves all 4 extremities w/o difficulty.   LVAD Interrogation HM 3: Speed: 5400 Flow: 4.3 PI: 3.2 Power: 4. Occasional PI events   I reviewed the LVAD parameters from today, and compared the results to the patient's prior recorded data.  No programming changes were made.  The LVAD is functioning within specified parameters.  The patient performs LVAD self-test daily.   LVAD interrogation was negative for any significant power changes, alarms or PI events/speed drops.  LVAD equipment check completed and is in good working order.     [name] Advanced Heart Failure   Discharge Weight:  Discharge Vitals: Blood pressure (!) 82/72, pulse 63, temperature 98.2 F (36.8 C), temperature source Oral, resp. rate 15, height 5\' 7"  (1.702 m), weight 75.8 kg, SpO2 94%.  Labs: Lab Results  Component Value Date   WBC 10.1 05/12/2023   HGB 11.6 (L) 05/12/2023   HCT 35.5 (L) 05/12/2023   MCV 96.5 05/12/2023   PLT 201 05/12/2023    Recent Labs  Lab 05/12/23 0505  NA 142  K 4.3  CL 107  CO2 25  BUN 13  CREATININE 1.01  CALCIUM  9.1  GLUCOSE 135*   Lab Results  Component Value Date   CHOL 90 08/11/2022   HDL 26 (L) 08/11/2022   LDLCALC 51 08/11/2022   TRIG 64 08/11/2022   BNP (last 3 results) Recent Labs    09/11/22 2252 09/18/22 2246 09/25/22 2351  BNP 933.0* 809.0* 851.4*    ProBNP (last 3 results) No results for input(s): "PROBNP" in the last 8760 hours.   Diagnostic Studies/Procedures   DG C-Arm 1-60 Min Result Date: 05/11/2023 CLINICAL DATA:  Right scratched at cystoscopy. Right ureteroscopy. Stent placement. EXAM: DG C-ARM 1-60 MIN COMPARISON:  04/17/2023. FLUOROSCOPY: Exposure Index (as provided by the fluoroscopic device): 15.38 mGy FINDINGS: mild right-sided hydronephrosis is present on the initial images. A right ureteral stent is in place. The proximal coil is not visualized. The distal coil is formed on the final image. IMPRESSION: 1. Right ureteral stent  placement. 2. Mild right-sided hydronephrosis. Electronically Signed   By: Audree Leas M.D.   On: 05/11/2023 15:03   DG C-Arm 1-60 Min Result Date: 05/11/2023 CLINICAL DATA:  Cystoscopy, right ureteroscopy EXAM: DG C-ARM 1-60 MIN COMPARISON:  None Available. FINDINGS: Eight fluoroscopic images are obtained during the performance of the procedure and are provided for  interpretation only. Images demonstrate cystoscopy, with cannulation and opacification of a dilated renal collecting system reported as the right side. Subsequent placement of a ureteral stent extending from the renal pelvis through the bladder. Please refer to operative report. Fluoroscopy time: 56 seconds, 15.38 mGy IMPRESSION: 1. Intraoperative exam as above. Please refer to the operative report. Electronically Signed   By: Bobbye Burrow M.D.   On: 05/11/2023 14:55    Discharge Medications   Allergies as of 05/12/2023       Reactions   Pacerone  [amiodarone ] Other (See Comments)   Severe tremors   Oxycodone  Itching   Pt knows as percocet        Medication List     TAKE these medications    Advair  HFA 230-21 MCG/ACT inhaler Generic drug: fluticasone-salmeterol Inhale 2 puffs into the lungs 2 (two) times daily.   amoxicillin  500 MG tablet Commonly known as: AMOXIL  Take 2 tablets (1,000 mg total) by mouth 2 (two) times daily.   aspirin  EC 81 MG tablet Take 1 tablet (81 mg total) by mouth daily. Swallow whole.   atorvastatin  80 MG tablet Commonly known as: LIPITOR  Take 1 tablet (80 mg total) by mouth daily.   melatonin 5 MG Tabs Take 5 mg by mouth at bedtime.   mexiletine 250 MG capsule Commonly known as: MEXITIL  TAKE 1 CAPSULE BY MOUTH 2 TIMES DAILY.   multivitamin with minerals Tabs tablet Take 1 tablet by mouth daily.   nystatin  cream Commonly known as: MYCOSTATIN  Apply 1 Application topically 2 (two) times daily as needed (itching).   pantoprazole  40 MG tablet Commonly known as: PROTONIX  Take 1 tablet (40 mg total) by mouth daily before breakfast.   sertraline  50 MG tablet Commonly known as: ZOLOFT  TAKE 1 TABLET BY MOUTH EVERY DAY   sildenafil  20 MG tablet Commonly known as: REVATIO  Take 1 tablet (20 mg total) by mouth 3 (three) times daily. What changed: additional instructions   warfarin 3 MG tablet Commonly known as: COUMADIN  Take as directed. If  you are unsure how to take this medication, talk to your nurse or doctor. Original instructions: Take 3 mg (1 tablet) daily or as directed by the advanced heart failure clinic What changed:  how to take this when to take this        Disposition   The patient will be discharged in stable condition to home. Discharge Instructions     Diet - low sodium heart healthy   Complete by: As directed    Discharge instructions   Complete by: As directed    Please restart your warfarin tonight, we will call to schedule follow up with urology on Monday.   Increase activity slowly   Complete by: As directed        Follow-up Information     [facility] Follow up.   Contact information: [address] [place] [address] [place]  [address] [phone]                  Duration of Discharge Encounter: 37 minutes Time  Signed, Lauralee Poll  05/12/2023, 10:44 AM

## 2023-05-12 NOTE — Progress Notes (Signed)
 PHARMACY - ANTICOAGULATION CONSULT NOTE  Pharmacy Consult for warfarin Indication:  HM3 LVAD  Allergies  Allergen Reactions   Pacerone  [Amiodarone ] Other (See Comments)    Severe tremors   Oxycodone  Itching    Pt knows as percocet    Patient Measurements: Height: 5\' 7"  (170.2 cm) Weight: 75.8 kg (167 lb 1.7 oz) IBW/kg (Calculated) : 66.1 HEPARIN  DW (KG): 77.1  Vital Signs: Temp: 98.3 F (36.8 C) (04/26 1105) Temp Source: Oral (04/26 1105) BP: 89/67 (04/26 1105) Pulse Rate: 64 (04/26 1105)  Labs: Recent Labs    05/11/23 0855 05/11/23 1800 05/12/23 0505  HGB  --  11.7* 11.6*  HCT  --  35.4* 35.5*  PLT  --  201 201  LABPROT 19.0*  --  17.5*  INR 1.6*  --  1.4*  CREATININE  --  1.10 1.01    Estimated Creatinine Clearance: 73.6 mL/min (by C-G formula based on SCr of 1.01 mg/dL).   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   Stroke (HCC)    Systolic heart failure (HCC) 2021   LVEF 18%, RVEF 38% on cardiac MRI 12/19/2019. possible cardiac sarcoidosis.   Wide-complex tachycardia 2021   wears LifeVest    Medications:  Scheduled:   amoxicillin   1,000 mg Oral Q12H   aspirin  EC  81 mg Oral Daily   atorvastatin   80 mg Oral Daily   melatonin  5 mg Oral QHS   mexiletine  250 mg Oral BID   multivitamin with minerals  1 tablet Oral Daily   pantoprazole   40 mg Oral QAC breakfast   sertraline   50 mg Oral Daily    sildenafil   20 mg Oral TID    Assessment: 12 yom presenting for secondary urology procedure given gross hematuria and bloody efflux of R ureteral orifice. Of note, has LVAD HM3 on warfarin PTA (LD 4/24). Has reduced INR goal given hematuria - PTA regimen is 3 mg daily.   Underwent cystoscopy with R ureteral stent exchange on 4/25. INR is therapeutic at 1.3 today. Warfarin was held the previous night with inc hematuria. Improved this AM.   Goal of Therapy:  INR 1.5-2 Monitor platelets by anticoagulation protocol: Yes   Plan:  Warfarin 3 mg tonight Monitor daily INR, CBC, and for s/sx of bleeding   Thank you for allowing pharmacy to participate in this patient's care,  Albino Alu, PharmD PGY2 Cardiology Pharmacy Resident= 05/12/2023 11:42 AM  Please check AMION for all [facility] Pharmacy phone numbers After 10:00 PM, call Main Pharmacy [phone]

## 2023-05-12 NOTE — Care Management Obs Status (Signed)
 MEDICARE OBSERVATION STATUS NOTIFICATION   Patient Details  Name: EDRIAN LESTAGE MRN: [id number] Date of Birth: [birth year]   Medicare Observation Status Notification Given:  Yes    Jannine Meo, RN 05/12/2023, 11:41 AM

## 2023-05-12 NOTE — Care Management CC44 (Signed)
 Condition Code 44 Documentation Completed  Patient Details  Name: URIAH KLABUNDE MRN: [id number] Date of Birth: 11-24-1965   Condition Code 44 given:  Yes Patient signature on Condition Code 44 notice:  Yes Documentation of 2 MD's agreement:  Yes Code 44 added to claim:  Yes    Jannine Meo, RN 05/12/2023, 11:41 AM

## 2023-05-13 NOTE — Progress Notes (Signed)
 LVAD CLINIC NOTE  PCP: Aloha Arnold, PA-C Cardiologist: Zoe Hinds, MD HF MD:    HPI:  Charles Holmes is a 58 y.o. 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 Underwent HM- 3 LVAD implant on 08/13 + clipping left atrial appendage    Admitted to [facility] 11/21 with CP. Found to have VT and EF 25-30% mild AI, Charles and TR. Transferred to [facility]. Cath showed 70% prox LAD with no intervention.    S/P CRT-D upgrade 06/08/21.   6/24, admitted w/ acute CVA due to right M1 occlusion treated w/ mechanical thrombectomy.   Echo showed LV markedly dilated EF < 20% and small effusion. RV ok. CVA felt to be cardioembolic.   Admitted in 7/24 for low-output HF. Angiography showed mild nonobstructive CAD. RHC hemodynamics c/w severe NICM w/ severely elevated filling pressures and low output state c/w cardiogenic shock. Repeat PFTs w/ severe obstructive defect (FEV1 1.04L, FEV1/FVC 48%) +response to bronchodilator.   Underwent LVAD implant on 08/13 + clipping left atrial appendage d/t severe thickening and invagination of mitral valve annulus impeding flows. Apical core sent to pathology - no mention of sarcoid.   Post VAD implant c/b left-sided hemiplegia. CT head 8/14 with acute R MCA infarct. Taken to IR for thrombectomy. F/u head CT with small hemorrhagic conversion. Had some residual deficits w/ mild left sided weakness but otherwise recovered. Course further c/b enterococcus faecalis bacteremia w/ 2/2 + cultures on 9/6. ID consulted. Recommended ampicillin  and ceftriaxone . PICC removed. Now on suppressive amoxicillin   Here for post hospital f/u. Underwent cystoscopy/uretoscopy with ureteral stent placement on 04/16/23 for gross hematuria . R ureter and kidney not well visualized due to bleeding. Scheduled for repeat cystoscopy/uteroscopy on 4/25. Will keep INR 1.5-2.0. Otherwise doing well. Denies orthopnea or PND. No fevers, chills or problems with  driveline. No bleeding, melena or neuro symptoms. No VAD alarms. Taking all meds as prescribed.    VAD Indication: Destination Therapy - sarcoid   LVAD assessment: HM III   Primary Controller: VAD Speed: 5400 rpms Flow: 4.5 Power: 4.1w    PI: 3.9   Alarms: none Events: 40-50 daily   Fixed speed: 5400 Low speed limit: 5100   Primary controller: back up battery due for replacement in 23 months Secondary controller: back up battery due for replacement in 23 months   I reviewed the LVAD parameters from today and compared the results to the patient's prior recorded data. LVAD interrogation was NEGATIVE for significant power changes, NEGATIVE for clinical alarms and STABLE for PI events/speed drops. No programming changes were made and pump is functioning within specified parameters. Pt is performing daily controller and system monitor self tests along with completing weekly and monthly maintenance for LVAD equipment.     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   Stroke (HCC)    Systolic heart failure (  HCC) 2021   LVEF 18%, RVEF 38% on cardiac MRI 12/19/2019. possible cardiac sarcoidosis.   Wide-complex tachycardia 2021   wears LifeVest    Current Outpatient Medications  Medication Sig Dispense Refill   amoxicillin  (AMOXIL ) 500 MG tablet Take 2 tablets (1,000 mg total) by mouth 2 (two) times daily. 360 tablet 3   aspirin  EC 81 MG tablet  Take 1 tablet (81 mg total) by mouth daily. Swallow whole. 120 tablet 0   atorvastatin  (LIPITOR ) 80 MG tablet Take 1 tablet (80 mg total) by mouth daily. (Patient taking differently: Take 80 mg by mouth daily.) 30 tablet 0   fluticasone-salmeterol (ADVAIR  HFA) 230-21 MCG/ACT inhaler Inhale 2 puffs into the lungs 2 (two) times daily. (Patient taking differently: Inhale 2 puffs into the lungs 2 (two) times daily.) 1 each 12   melatonin 5 MG TABS Take 5 mg by mouth at bedtime.     mexiletine (MEXITIL ) 250 MG capsule TAKE 1 CAPSULE BY MOUTH 2 TIMES DAILY. 180 capsule 3   pantoprazole  (PROTONIX ) 40 MG tablet Take 1 tablet (40 mg total) by mouth daily before breakfast. (Patient taking differently: Take 40 mg by mouth daily before breakfast.) 30 tablet 0   sertraline  (ZOLOFT ) 50 MG tablet TAKE 1 TABLET BY MOUTH EVERY DAY 90 tablet 3   sildenafil  (REVATIO ) 20 MG tablet Take 1 tablet (20 mg total) by mouth 3 (three) times daily. (Patient taking differently: Take 20 mg by mouth 3 (three) times daily.  0830/  1630/  2030) 90 tablet 11   warfarin (COUMADIN ) 3 MG tablet Take 3 mg (1 tablet) daily or as directed by the advanced heart failure clinic (Patient taking differently: Take by mouth as directed. Take 3 mg (1 tablet) daily or as directed by the advanced heart failure clinic) 180 tablet 3   Multiple Vitamin (MULTIVITAMIN WITH MINERALS) TABS tablet Take 1 tablet by mouth daily. (Patient not taking: Reported on 04/16/2023)     nystatin  cream (MYCOSTATIN ) Apply 1 Application topically 2 (two) times daily as needed (itching).     No current facility-administered medications for this encounter.    Pacerone  [amiodarone ] and Oxycodone    Vital Signs:  Doppler Pressure: 94 Automatic BP:102/83 (90)  HR: 66 SPO2: 95% RA   Weight: 175 lb w/ eqt Discharge weight: 168 lb w/ eqt    Physical Exam: General:  NAD.  HEENT: normal  Neck: supple. JVP not elevated.  Carotids 2+ bilat; no bruits. No lymphadenopathy  or thryomegaly appreciated. Cor: LVAD hum.  Lungs: Clear. Abdomen: soft, nontender, non-distended. No hepatosplenomegaly. No bruits or masses. Good bowel sounds. Driveline site clean. Anchor in place.  Extremities: no cyanosis, clubbing, rash. Warm no edema  Neuro: alert & oriented x 3. No focal deficits. Moves all 4 without problem    ASSESSMENT AND PLAN:   1.  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 Presented with VT. LHC 70% LAD  - cMRI 12/21 concerning for sarcoid and EF 18%.  - PET 2/22 at [facility] EF 25% + active sarcoid - Echo 08/26/20 EF < 20% severely dilated LV RV mildly decreased.  - Medtronic CRT-D upgrade in 06/08/21 - Echo 07/10/22: EF <20%, RV okay, mod pericardial effusion, mod Charles/TR - 08/29/22 HM III LVAD implant + clipping LAA d/t severe thickening and invagination of mitral valve annulus impeding flows. Core biopsy not c/w sarcoid  - Stable with VAD NYHA I  - Volume ok - Continue spiro   2. HM-3 LVA - VAD interrogated personally.  Parameters stable. Continues with frequent PIs but no low flows - LDH 205 - DL site ok (on amox) - MAPs ok - Hgb 13.5 - INR 1.6. Goal 1.5-2.0 for now with hematuria,.  Remains on ASA 81 mg daily (on for CVA). Discussed warfarin dosing with PharmD personally.  3. Gross hematuria - being followed by Urology - Underwent cystoscopy/uretoscopy with ureteral stent placement on 04/16/23 . R ureter and kidney not well visualized due to bleeding.  - Scheduled for repeat cystoscopy/uteroscopy on 4/25. Will keep INR 1.5-2.0   4.  H/o stroke - Admitted 06/24 w/ R MCA stroke. S/p TPA and mechanical clot extraction. No residual deficits. Likely cardioembolic in setting of severe LV dysfunction. - Developed left sided weakness 08/14. CTA with R MCA infarct. Taken to IR for thrombectomy - Repeat CT head with small to moderate size hemorrhagic conversion.  - Recovered well with just mild LUE weakness.No change - No changee  5.  Enterococcus faecalis bacteremia - Bcx 2/2 on 9/6 - ID consult 9/6 -> ampicillin  and ceftriaxone  - Echo 09/10 - no obvious vegetations - Completed 6 weeks of IV Ampicillin  and Ceftriaxone . Now on chronic suppression with Amoxicillin . - DL site ok today   6. Hx VT - ln setting of potential sarcoid heart disease though core biopsy negative at time of VAD placement - Off amio due to tremor. Continue mexiletine  - now s/p ICD. - No recent VT   7. CAD - LHC 12/07/19 70-% LAD, no intervention - LHC 8/24 non obstructive CAD.  - Continue statin. On aspirin  for VAD. -No s/s angina   8. 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  - holding methotrexate  w/ recent surgery and active infection, can discuss timing of restarting the medication at outpatient follow-up - apical core pathology not diagnostic of cardiac sarcoidosis.  - PET negative for sarcoid, Will not restart methotrexate  - No change   9. Paroxsymal AT/AF - Currently in NSR - No recent breakthorugh   10. Pulmonary  - PFTs with severe obstructive defect, response to bronchodilator. FEV1 1.04L, FEV1/FVC 48% - stable  I spent a total of 41 minutes today: 1) reviewing the patient's medical records including previous charts, labs and recent notes from other providers; 2) examining the patient and counseling them on their medical issues/explaining the plan of care; 3) adjusting meds as needed and 4) ordering lab work or other needed tests.     Jules Oar, MD  10:24 PM

## 2023-05-14 ENCOUNTER — Telehealth (HOSPITAL_COMMUNITY): Payer: Self-pay | Admitting: *Deleted

## 2023-05-14 ENCOUNTER — Other Ambulatory Visit (HOSPITAL_COMMUNITY): Payer: Self-pay | Admitting: *Deleted

## 2023-05-14 ENCOUNTER — Encounter (HOSPITAL_COMMUNITY): Payer: Self-pay | Admitting: Urology

## 2023-05-14 DIAGNOSIS — B372 Candidiasis of skin and nail: Secondary | ICD-10-CM

## 2023-05-14 LAB — SURGICAL PATHOLOGY

## 2023-05-14 MED ORDER — NYSTATIN 100000 UNIT/GM EX CREA
1.0000 | TOPICAL_CREAM | Freq: Two times a day (BID) | CUTANEOUS | 3 refills | Status: AC | PRN
  Filled 2023-09-11: qty 30, 15d supply, fill #0

## 2023-05-14 NOTE — TOC Initial Note (Signed)
 Transition of Care [facility]) - Initial/Assessment Note    Patient Details  Name: Charles Holmes MRN: [id number] Date of Birth: 10/04/1965  [facility]) CM/SW Contact:    Benjiman Bras, RN Phone Number: [phone] 05/14/2023, 1:53 PM  Clinical Narrative:    Readmission             TOC CM spoke to pt and states he lives alone and independent at home. He drives to his appts.   Expected Discharge Plan: Home/Self Care Barriers to Discharge: No Barriers Identified   Patient Goals and CMS Choice Patient states their goals for this hospitalization and ongoing recovery are:: wants to remain independent          Expected Discharge Plan and Services   Discharge Planning Services: CM Consult   Living arrangements for the past 2 months: Mobile Home Expected Discharge Date: 05/12/23                                    Prior Living Arrangements/Services Living arrangements for the past 2 months: Mobile Home Lives with:: Self, Pets Patient language and need for interpreter reviewed:: Yes Do you feel safe going back to the place where you live?: Yes      Need for Family Participation in Patient Care: No (Comment) Care giver support system in place?: Yes (comment) Current home services: DME (scale) Criminal Activity/Legal Involvement Pertinent to Current Situation/Hospitalization: No - Comment as needed  Activities of Daily Living   ADL Screening (condition at time of admission) Independently performs ADLs?: Yes (appropriate for developmental age) Is the patient deaf or have difficulty hearing?: Yes Does the patient have difficulty seeing, even when wearing glasses/contacts?: No Does the patient have difficulty concentrating, remembering, or making decisions?: No  Permission Sought/Granted Permission sought to share information with : Case Manager, Family Supports, PCP Permission granted to share information with : Yes, Verbal Permission Granted  Share  Information with NAME: Alvenia Job     Permission granted to share info w Relationship: daughter  Permission granted to share info w Contact Information: [phone]  Emotional Assessment Appearance:: Appears stated age Attitude/Demeanor/Rapport: Engaged Affect (typically observed): Accepting Orientation: : Oriented to Self, Oriented to Place, Oriented to  Time, Oriented to Situation   Psych Involvement: No (comment)  Admission diagnosis:  Gross hematuria [R31.0] Hematuria [R31.9] Patient Active Problem List   Diagnosis Date Noted   Hematuria 05/12/2023   Gross hematuria 04/16/2023   Infection associated with driveline of left ventricular assist device (LVAD) (HCC) 11/19/2022   Enterococcus faecalis infection 11/19/2022   Vaccine counseling 11/19/2022   Hypotension due to drugs 10/10/2022   Acute blood loss anemia 10/10/2022   LVAD (left ventricular assist device) present (HCC) 10/05/2022   Acute right MCA stroke (HCC) 09/27/2022   Bacteremia due to Enterococcus 09/25/2022   Middle cerebral artery embolism, 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   Chronic obstructive pulmonary disease (HCC) 08/23/2022   History of CVA (cerebrovascular accident) 08/11/2022   Paroxysmal atrial fibrillation (HCC) 08/11/2022   Iron deficiency anemia 08/11/2022   CHF (congestive heart failure) (HCC) 08/10/2022   Stroke (cerebrum) (HCC) 07/11/2022   Status post surgery 07/10/2022   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   Coronary artery disease involving native coronary artery of native heart without angina pectoris 03/02/2020   Chronic systolic heart failure (HCC) 02/21/2020   Ventricular tachycardia (HCC) 02/19/2020   Ventricular tachycardia, sustained (HCC) 02/19/2020  AKI (acute kidney injury) (HCC) 12/19/2019   CAD (coronary artery disease) 12/19/2019   Hyperlipidemia with target LDL less than 70 12/19/2019   Wide-complex tachycardia 12/16/2019   Acute on chronic systolic CHF (congestive heart failure) (HCC)  12/16/2019   HTN (hypertension) 12/16/2019   PCP:  Aloha Arnold, PA-C Pharmacy:   [facility] [phone] - [place], [place] - [address] AT [facility] [address] [facility] [place] [address] Phone: [phone] Fax: [phone]     Social Drivers of Health (SDOH) Social History: SDOH Screenings   Food Insecurity: Food Insecurity Present (05/11/2023)  Housing: Low Risk  (05/11/2023)  Transportation Needs: No Transportation Needs (05/11/2023)  Utilities: Not At Risk (05/11/2023)  Depression (PHQ2-9): Low Risk  (02/26/2023)  Financial Resource Strain: Low Risk  (07/12/2022)  Tobacco Use: Medium Risk (05/11/2023)   SDOH Interventions:     Readmission Risk Interventions     No data to display

## 2023-05-14 NOTE — Telephone Encounter (Signed)
 Pt discharged over the weekend. Called and spoke with [facility] this morning. Confirmed he has follow up appt scheduled 05/23/23 at 12:30.   Pt states he spoke with [facility] this morning due to constant dribbling when he stands up. Was instructed to remove stent at that time. Confirmed he is aware he has follow up appt.   Paulo Bosworth RN VAD Coordinator  Office: [phone]  24/7 Pager: [phone]

## 2023-05-15 ENCOUNTER — Ambulatory Visit (HOSPITAL_COMMUNITY): Payer: Self-pay | Admitting: Pharmacist

## 2023-05-15 DIAGNOSIS — Z7901 Long term (current) use of anticoagulants: Secondary | ICD-10-CM | POA: Diagnosis not present

## 2023-05-15 DIAGNOSIS — Z95811 Presence of heart assist device: Secondary | ICD-10-CM | POA: Diagnosis not present

## 2023-05-15 LAB — POCT INR: INR: 1.4 — AB (ref 2.0–3.0)

## 2023-05-20 NOTE — Anesthesia Postprocedure Evaluation (Signed)
 Anesthesia Post Note  Patient: Charles Holmes  Procedure(s) Performed: CYSTOSCOPY/URETEROSCOPY/STENT PLACEMENT (Right: Ureter)     Patient location during evaluation: PACU Anesthesia Type: General Level of consciousness: awake and alert Pain management: pain level controlled Vital Signs Assessment: post-procedure vital signs reviewed and stable Respiratory status: spontaneous breathing, nonlabored ventilation, respiratory function stable and patient connected to nasal cannula oxygen Cardiovascular status: blood pressure returned to baseline and stable Postop Assessment: no apparent nausea or vomiting Anesthetic complications: no   No notable events documented.  Last Vitals:  Vitals:   05/12/23 0721 05/12/23 1105  BP:  (!) 89/67  Pulse: 63 64  Resp:    Temp: 36.8 C 36.8 C  SpO2:      Last Pain:  Vitals:   05/12/23 1105  TempSrc: Oral  PainSc: 0-No pain                 Emya Picado L Sherrol Vicars

## 2023-05-20 NOTE — Anesthesia Procedure Notes (Signed)
 Arterial Line Insertion Start/End4/25/2025 9:34 AM, 05/11/2023 9:45 AM Performed by: Grace Laura, MD, anesthesiologist  Patient location: OR. Preanesthetic checklist: patient identified, IV checked, risks and benefits discussed, surgical consent, monitors and equipment checked, pre-op evaluation and timeout performed Right, radial was placed Catheter size: 20 G Hand hygiene performed  Allen's test indicative of satisfactory collateral circulation Attempts: 1 Procedure performed using ultrasound guided technique. Following insertion, dressing applied and Biopatch. Post procedure assessment: normal and unchanged  Patient tolerated the procedure well with no immediate complications.

## 2023-05-23 ENCOUNTER — Ambulatory Visit (HOSPITAL_COMMUNITY): Payer: Self-pay | Admitting: Pharmacist

## 2023-05-23 DIAGNOSIS — R350 Frequency of micturition: Secondary | ICD-10-CM | POA: Diagnosis not present

## 2023-05-23 DIAGNOSIS — N3021 Other chronic cystitis with hematuria: Secondary | ICD-10-CM | POA: Diagnosis not present

## 2023-05-23 DIAGNOSIS — R310 Gross hematuria: Secondary | ICD-10-CM | POA: Diagnosis not present

## 2023-05-23 LAB — POCT INR: INR: 1.7 — AB (ref 2.0–3.0)

## 2023-05-24 ENCOUNTER — Other Ambulatory Visit (HOSPITAL_COMMUNITY): Payer: Self-pay | Admitting: *Deleted

## 2023-05-24 DIAGNOSIS — Z7901 Long term (current) use of anticoagulants: Secondary | ICD-10-CM

## 2023-05-24 DIAGNOSIS — Z95811 Presence of heart assist device: Secondary | ICD-10-CM

## 2023-05-24 DIAGNOSIS — I5022 Chronic systolic (congestive) heart failure: Secondary | ICD-10-CM

## 2023-05-25 ENCOUNTER — Encounter (HOSPITAL_COMMUNITY): Payer: Self-pay | Admitting: Internal Medicine

## 2023-05-25 ENCOUNTER — Ambulatory Visit (HOSPITAL_COMMUNITY)
Admission: RE | Admit: 2023-05-25 | Discharge: 2023-05-25 | Disposition: A | Discharge status: Home / Self Care | Source: Ambulatory Visit | Attending: Internal Medicine | Admitting: Internal Medicine

## 2023-05-25 VITALS — BP 92/76 | HR 78

## 2023-05-25 DIAGNOSIS — Z4509 Encounter for adjustment and management of other cardiac device: Secondary | ICD-10-CM | POA: Diagnosis not present

## 2023-05-25 DIAGNOSIS — K219 Gastro-esophageal reflux disease without esophagitis: Secondary | ICD-10-CM | POA: Diagnosis not present

## 2023-05-25 DIAGNOSIS — I2510 Atherosclerotic heart disease of native coronary artery without angina pectoris: Secondary | ICD-10-CM | POA: Diagnosis not present

## 2023-05-25 DIAGNOSIS — R7881 Bacteremia: Secondary | ICD-10-CM | POA: Diagnosis not present

## 2023-05-25 DIAGNOSIS — I472 Ventricular tachycardia, unspecified: Secondary | ICD-10-CM | POA: Diagnosis not present

## 2023-05-25 DIAGNOSIS — I5022 Chronic systolic (congestive) heart failure: Secondary | ICD-10-CM | POA: Insufficient documentation

## 2023-05-25 DIAGNOSIS — I428 Other cardiomyopathies: Secondary | ICD-10-CM | POA: Insufficient documentation

## 2023-05-25 DIAGNOSIS — R310 Gross hematuria: Secondary | ICD-10-CM | POA: Diagnosis not present

## 2023-05-25 DIAGNOSIS — I480 Paroxysmal atrial fibrillation: Secondary | ICD-10-CM | POA: Diagnosis not present

## 2023-05-25 DIAGNOSIS — I110 Hypertensive heart disease with heart failure: Secondary | ICD-10-CM | POA: Insufficient documentation

## 2023-05-25 DIAGNOSIS — Z7901 Long term (current) use of anticoagulants: Secondary | ICD-10-CM | POA: Insufficient documentation

## 2023-05-25 DIAGNOSIS — J984 Other disorders of lung: Secondary | ICD-10-CM | POA: Diagnosis not present

## 2023-05-25 DIAGNOSIS — Z95811 Presence of heart assist device: Secondary | ICD-10-CM | POA: Diagnosis not present

## 2023-05-25 DIAGNOSIS — I4892 Unspecified atrial flutter: Secondary | ICD-10-CM | POA: Diagnosis not present

## 2023-05-25 DIAGNOSIS — Z792 Long term (current) use of antibiotics: Secondary | ICD-10-CM | POA: Insufficient documentation

## 2023-05-25 DIAGNOSIS — B952 Enterococcus as the cause of diseases classified elsewhere: Secondary | ICD-10-CM | POA: Insufficient documentation

## 2023-05-25 DIAGNOSIS — Z8673 Personal history of transient ischemic attack (TIA), and cerebral infarction without residual deficits: Secondary | ICD-10-CM | POA: Diagnosis not present

## 2023-05-25 LAB — CBC
HCT: 39.8 % (ref 39.0–52.0)
Hemoglobin: 13 g/dL (ref 13.0–17.0)
MCH: 31.3 pg (ref 26.0–34.0)
MCHC: 32.7 g/dL (ref 30.0–36.0)
MCV: 95.7 fL (ref 80.0–100.0)
Platelets: 184 10*3/uL (ref 150–400)
RBC: 4.16 MIL/uL — ABNORMAL LOW (ref 4.22–5.81)
RDW: 14.7 % (ref 11.5–15.5)
WBC: 7 10*3/uL (ref 4.0–10.5)
nRBC: 0 % (ref 0.0–0.2)

## 2023-05-25 LAB — BASIC METABOLIC PANEL WITH GFR
Anion gap: 9 (ref 5–15)
BUN: 11 mg/dL (ref 6–20)
CO2: 28 mmol/L (ref 22–32)
Calcium: 9.2 mg/dL (ref 8.9–10.3)
Chloride: 107 mmol/L (ref 98–111)
Creatinine, Ser: 1.01 mg/dL (ref 0.61–1.24)
GFR, Estimated: >60 mL/min (ref >60)
Glucose, Bld: 72 mg/dL (ref 70–99)
Potassium: 4.4 mmol/L (ref 3.5–5.1)
Sodium: 144 mmol/L (ref 135–145)

## 2023-05-25 LAB — PROTIME-INR
INR: 2 — ABNORMAL HIGH (ref 0.8–1.2)
Prothrombin Time: 22.8 s — ABNORMAL HIGH (ref 11.4–15.2)

## 2023-05-25 LAB — LACTATE DEHYDROGENASE: LDH: 192 U/L (ref 98–192)

## 2023-05-25 NOTE — Patient Instructions (Signed)
No medication changes ?Coumadin dosing per Ander Purpura PharmD ?Return to clinic in 2 months  ?

## 2023-05-25 NOTE — Progress Notes (Signed)
 Pt presents for hosp d/c f/u in VAD Clinic today alone. Reports no problems with VAD equipment or concerns with drive line.   Denies lightheadedness, dizziness, shortness of breath, heart failure symptoms, and signs of bleeding. Hematuria has resolved.   Patient remains on chronic suppressive Amoxicillin  1000 mg BID.   S/p cystoscopy/uteroscopy on 4/25. Stent removed by pt 05/23/23 per urology instructions. Recently had follow up with Dr Valeta Gaudier. Biopsies from procedure were inconclusive. No longer experiencing hematuria.   Vital Signs:  Doppler Pressure: 88 Automatic BP: 92/76 (83) HR: 78 SR SPO2: UTO% RA   Weight: 174.3 lb w/ eqt Discharge weight: 170 lb w/ eqt   VAD Indication: Destination Therapy - sarcoid   LVAD assessment: HM III  Primary Controller: VAD Speed: 5400 rpms Flow: 4.4 Power: 4.1 w    PI: 3.5  Alarms: none Events: 40-50 daily   Fixed speed: 5400 Low speed limit: 5100  Primary controller: back up battery due for replacement in 22 months Secondary controller: back up battery due for replacement in 23 months   I reviewed the LVAD parameters from today and compared the results to the patient's prior recorded data. LVAD interrogation was NEGATIVE for significant power changes, NEGATIVE for clinical alarms and STABLE for PI events/speed drops. No programming changes were made and pump is functioning within specified parameters. Pt is performing daily controller and system monitor self tests along with completing weekly and monthly maintenance for LVAD equipment.   LVAD equipment check completed and is in good working order. Back-up equipment not present at today's visit.   Annual Equipment Maintenance on UBC/PM was performed on 08/29/22.  Exit Site Care:  Existing VAD dressing clean, dry, and intact. Anchor correctly applied. Provided with 8 weekly kits for home use.     Significant Events on VAD Support:  08/30/22: Acute infarct seen on the right temporal cortex  and basal ganglia    Device: Medtronic Therapies: ON Last check: 06/13/22   BP & Labs:  MAP 88 - Doppler is reflecting modified systolic   Hgb 13.0 - No S/S of bleeding. Specifically denies melena/BRBPR or nosebleeds.   LDH 192 with established baseline of 180- 245. Denies tea-colored urine. No power elevations noted on interrogation.    Plan:  No medication changes Coumadin  dosing per Lauren PharmD Return to clinic in 2 months  Paulo Bosworth RN VAD Coordinator  Office: [phone]  24/7 Pager: [phone]

## 2023-05-27 NOTE — Progress Notes (Signed)
 LVAD CLINIC NOTE  PCP: Aloha Arnold, PA-C Cardiologist: Zoe Hinds, MD HF MD:    HPI:  Charles Holmes is a 58 y.o. 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 Underwent HM- 3 LVAD implant on 08/13 + clipping left atrial appendage    Admitted to [facility] 11/21 with CP. Found to have VT and EF 25-30% mild AI, Charles and TR. Transferred to [facility]. Cath showed 70% prox LAD with no intervention.    S/P CRT-D upgrade 06/08/21.   6/24, admitted w/ acute CVA due to right M1 occlusion treated w/ mechanical thrombectomy.   Echo showed LV markedly dilated EF < 20% and small effusion. RV ok. CVA felt to be cardioembolic.   Admitted in 7/24 for low-output HF. Angiography showed mild nonobstructive CAD. RHC hemodynamics c/w severe NICM w/ severely elevated filling pressures and low output state c/w cardiogenic shock. Repeat PFTs w/ severe obstructive defect (FEV1 1.04L, FEV1/FVC 48%) +response to bronchodilator.   Underwent LVAD implant on 08/13 + clipping left atrial appendage d/t severe thickening and invagination of mitral valve annulus impeding flows. Apical core sent to pathology - no mention of sarcoid.   Post VAD implant c/b left-sided hemiplegia. CT head 8/14 with acute R MCA infarct. Taken to IR for thrombectomy. F/u head CT with small hemorrhagic conversion. Had some residual deficits w/ mild left sided weakness but otherwise recovered. Course further c/b enterococcus faecalis bacteremia w/ 2/2 + cultures on 9/6. ID consulted. Recommended ampicillin  and ceftriaxone . PICC removed. Now on suppressive amoxicillin   Underwent cystoscopy/uretoscopy with ureteral stent placement on 04/16/23 for gross hematuria . R ureter and kidney not well visualized due to bleeding. S/p repeat cystoscopy/uteroscopy on 4/25. Stent removed by pt 05/23/23 per urology instructions. Recently had follow up with Dr Valeta Gaudier. Biopsies from procedure were inconclusive.  Here for f/u.  Feeling better. Hematuria has resolved. Voluem well controlled. Takes diuretics as needed. No CP or SOB..Denies orthopnea or PND. No fevers, chills or problems with driveline. No bleeding, melena or neuro symptoms. No VAD alarms. Taking all meds as prescribed.   VAD Indication: Destination Therapy - sarcoid   LVAD assessment: HM III   Primary Controller: VAD Speed: 5400 rpms Flow: 4.4 Power: 4.1 w    PI: 3.5   Alarms: none Events: 40-50 daily   Fixed speed: 5400 Low speed limit: 5100   Primary controller: back up battery due for replacement in 22 months Secondary controller: back up battery due for replacement in 23 months   I reviewed the LVAD parameters from today and compared the results to the patient's prior recorded data. LVAD interrogation was NEGATIVE for significant power changes, NEGATIVE for clinical alarms and STABLE for PI events/speed drops. No programming changes were made and pump is functioning within specified parameters. Pt is performing daily controller and system monitor self tests along with completing weekly and monthly maintenance for LVAD equipment.   LVAD equipment check completed and is in good working order. Back-up equipment not present at today's visit.   Annual Equipment Maintenance on UBC/PM was performed on 08/29/22.   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   Stroke [facility])    Systolic heart failure (HCC)  1610   LVEF 18%, RVEF 38% on cardiac MRI 12/19/2019. possible cardiac sarcoidosis.   Wide-complex tachycardia 2021   wears LifeVest    Current Outpatient Medications  Medication Sig Dispense Refill   amoxicillin  (AMOXIL ) 500 MG tablet Take 2 tablets (1,000 mg total) by mouth 2 (two) times daily. 360 tablet 3   aspirin  EC 81 MG tablet Take 1 tablet (81 mg total) by mouth daily. Swallow whole. 120 tablet 0   atorvastatin  (LIPITOR ) 80 MG tablet Take 1 tablet (80 mg total) by mouth daily. (Patient taking differently: Take 80 mg by mouth daily.) 30 tablet 0   melatonin 5 MG TABS Take 5 mg by mouth at bedtime.     mexiletine (MEXITIL ) 250 MG capsule TAKE 1 CAPSULE BY MOUTH 2 TIMES DAILY. 180 capsule 3   nystatin  cream (MYCOSTATIN ) Apply 1 Application topically 2 (two) times daily as needed (itching). 30 g 3   pantoprazole  (PROTONIX ) 40 MG tablet Take 1 tablet (40 mg total) by mouth daily before breakfast. (Patient taking differently: Take 40 mg by mouth daily before breakfast.) 30 tablet 0   sertraline  (ZOLOFT ) 50 MG tablet TAKE 1 TABLET BY MOUTH EVERY DAY 90 tablet 3   sildenafil  (REVATIO ) 20 MG tablet Take 1 tablet (20 mg total) by mouth 3 (three) times daily. (Patient taking differently: Take 20 mg by mouth 3 (three) times daily.  0830/  1630/  2030) 90 tablet 11   warfarin (COUMADIN ) 3 MG tablet Take 3 mg (1 tablet) daily or as directed by the advanced heart failure clinic (Patient taking differently: Take by mouth as directed. Take 3 mg (1 tablet) daily or as directed by the advanced heart failure clinic) 180 tablet 3   fluticasone-salmeterol (ADVAIR  HFA) 230-21 MCG/ACT inhaler Inhale 2 puffs into the lungs 2 (two) times daily. (Patient not taking: Reported on 05/25/2023) 1 each 12   Multiple Vitamin (MULTIVITAMIN WITH MINERALS) TABS tablet Take 1 tablet by mouth daily. (Patient not taking: Reported on 04/16/2023)     No current facility-administered medications for this encounter.    Pacerone   [amiodarone ] and Oxycodone    Vital Signs:  Doppler Pressure: 88 Automatic BP: 92/76 (83) HR: 78 SR SPO2: UTO% RA   Weight: 174.3 lb w/ eqt Discharge weight: 170 lb w/ eqt    Physical Exam: General:  NAD.  HEENT: normal  Neck: supple. JVP not elevated.  Carotids 2+ bilat; no bruits. No lymphadenopathy or thryomegaly appreciated. Cor: LVAD hum.  Lungs: Clear. Abdomen: soft, nontender, non-distended. No hepatosplenomegaly. No bruits or masses. Good bowel sounds. Driveline site clean. Anchor in place.  Extremities: no cyanosis, clubbing, rash. Warm no edema  Neuro: alert & oriented x 3. No focal deficits. Moves all 4 without problem     ASSESSMENT AND PLAN:   1.  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 Presented with VT. LHC 70% LAD  - cMRI 12/21 concerning for sarcoid and EF 18%.  - PET 2/22 at [facility] EF 25% + active sarcoid - Echo 08/26/20 EF < 20% severely dilated LV RV mildly decreased.  - [employer] CRT-D upgrade in 06/08/21 - Echo 07/10/22: EF <20%, RV okay, mod pericardial effusion,  mod Charles/TR - 08/29/22 HM III LVAD implant + clipping LAA d/t severe thickening and invagination of mitral valve annulus impeding flows. Core biopsy not c/w sarcoid  - Doing well. NYHA I with VAD support - Volume ok. Takes lasix  as needed - Continue spiro   2. HM-3 LVA - VAD interrogated personally. Parameters stable. - LDH 182 - DL site ok (on suppressive amox) - MAPs ok - Hgb stable 13.0 - INR 1.7. Goal 1.5-2.0 for now with recent hematuria,.  Remains on ASA 81 mg daily (on for CVA). Discussed warfarin dosing with PharmD personally.  3. Gross hematuria - being followed by Urology - Underwent cystoscopy/uretoscopy with ureteral stent placement on 04/16/23 . R ureter and kidney not well visualized due to bleeding.  - Had repeat cystoscopy/uteroscopy on 4/25. Stent now out - Hematuria resolved. Path inconclusive to date   4.  H/o stroke - Admitted 06/24 w/ R MCA stroke.  S/p TPA and mechanical clot extraction. No residual deficits. Likely cardioembolic in setting of severe LV dysfunction. - Developed left sided weakness 08/14. CTA with R MCA infarct. Taken to IR for thrombectomy - Repeat CT head with small to moderate size hemorrhagic conversion.  - Recovered well with just mild LUE weakness. - No change  5. Enterococcus faecalis bacteremia - Bcx 2/2 on 9/6 - ID consult 9/6 -> ampicillin  and ceftriaxone  - Echo 09/10 - no obvious vegetations - Completed 6 weeks of IV Ampicillin  and Ceftriaxone . Now on chronic suppression with Amoxicillin . - DL site looks good   6. Hx VT - ln setting of potential sarcoid heart disease though core biopsy negative at time of VAD placement - Off amio due to tremor. Continue mexiletine  - No recent VT   7. CAD - LHC 12/07/19 70-% LAD, no intervention - LHC 8/24 non obstructive CAD.  - Continue statin. On aspirin  for VAD. - No s/s angina   8. Possible cardiac sarcoid - PET 2/22 at [facility] EF 25% + active sarcoid - Has completed prednisone .  - holding methotrexate  w/ recent surgery and active infection, can discuss timing of restarting the medication at outpatient follow-up - apical core pathology not diagnostic of cardiac sarcoidosis.  - PET negative for sarcoid, Will not restart methotrexate  - No change   9. Paroxsymal AT/AF - Remains in NSR   10. Pulmonary  - PFTs with severe obstructive defect, response to bronchodilator. FEV1 1.04L, FEV1/FVC 48% - stable  I spent a total of 46 minutes today: 1) reviewing the patient's medical records including previous charts, labs and recent notes from other providers; 2) examining the patient and counseling them on their medical issues/explaining the plan of care; 3) adjusting meds as needed and 4) ordering lab work or other needed tests.     Jules Oar, MD  11:11 AM

## 2023-05-30 ENCOUNTER — Ambulatory Visit (HOSPITAL_COMMUNITY): Payer: Self-pay | Admitting: Pharmacist

## 2023-05-30 LAB — POCT INR: INR: 1.8 — AB (ref 2.0–3.0)

## 2023-06-06 ENCOUNTER — Ambulatory Visit (HOSPITAL_COMMUNITY): Payer: Self-pay | Admitting: Pharmacist

## 2023-06-06 LAB — POCT INR: INR: 1.8 — AB (ref 2.0–3.0)

## 2023-06-13 ENCOUNTER — Ambulatory Visit (HOSPITAL_COMMUNITY): Payer: Self-pay | Admitting: Pharmacist

## 2023-06-13 ENCOUNTER — Other Ambulatory Visit (HOSPITAL_COMMUNITY): Payer: Self-pay

## 2023-06-13 ENCOUNTER — Ambulatory Visit (INDEPENDENT_AMBULATORY_CARE_PROVIDER_SITE_OTHER): Payer: Medicare Other

## 2023-06-13 DIAGNOSIS — Z7901 Long term (current) use of anticoagulants: Secondary | ICD-10-CM | POA: Diagnosis not present

## 2023-06-13 DIAGNOSIS — Z95811 Presence of heart assist device: Secondary | ICD-10-CM | POA: Diagnosis not present

## 2023-06-13 DIAGNOSIS — I442 Atrioventricular block, complete: Secondary | ICD-10-CM | POA: Diagnosis not present

## 2023-06-13 LAB — POCT INR: INR: 1.7 — AB (ref 2.0–3.0)

## 2023-06-13 MED ORDER — ATORVASTATIN CALCIUM 80 MG PO TABS
80.0000 mg | ORAL_TABLET | Freq: Every day | ORAL | 0 refills | Status: DC
Start: 2023-06-13 — End: 2023-07-12

## 2023-06-14 LAB — CUP PACEART REMOTE DEVICE CHECK
Battery Remaining Longevity: 63 mo
Battery Voltage: 2.99 V
Brady Statistic AP VP Percent: 1.97 %
Brady Statistic AP VS Percent: 0.12 %
Brady Statistic AS VP Percent: 93.77 %
Brady Statistic AS VS Percent: 4.14 %
Brady Statistic RA Percent Paced: 2.05 %
Brady Statistic RV Percent Paced: 92.39 %
Date Time Interrogation Session: 20250528001604
HighPow Impedance: 59 Ohm
Implantable Lead Connection Status: 753985
Implantable Lead Connection Status: 753985
Implantable Lead Connection Status: 753985
Implantable Lead Implant Date: 20220204
Implantable Lead Implant Date: 20220204
Implantable Lead Implant Date: 20230523
Implantable Lead Location: 753858
Implantable Lead Location: 753859
Implantable Lead Location: 753860
Implantable Lead Model: 4598
Implantable Lead Model: 5076
Implantable Pulse Generator Implant Date: 20230523
Lead Channel Impedance Value: 165.351
Lead Channel Impedance Value: 165.351
Lead Channel Impedance Value: 168 Ohm
Lead Channel Impedance Value: 218.5 Ohm
Lead Channel Impedance Value: 223.149
Lead Channel Impedance Value: 247 Ohm
Lead Channel Impedance Value: 266 Ohm
Lead Channel Impedance Value: 323 Ohm
Lead Channel Impedance Value: 399 Ohm
Lead Channel Impedance Value: 437 Ohm
Lead Channel Impedance Value: 437 Ohm
Lead Channel Impedance Value: 456 Ohm
Lead Channel Impedance Value: 570 Ohm
Lead Channel Impedance Value: 627 Ohm
Lead Channel Impedance Value: 646 Ohm
Lead Channel Impedance Value: 665 Ohm
Lead Channel Impedance Value: 760 Ohm
Lead Channel Impedance Value: 760 Ohm
Lead Channel Pacing Threshold Amplitude: 0.625 V
Lead Channel Pacing Threshold Amplitude: 0.875 V
Lead Channel Pacing Threshold Amplitude: 1.125 V
Lead Channel Pacing Threshold Pulse Width: 0.4 ms
Lead Channel Pacing Threshold Pulse Width: 0.4 ms
Lead Channel Pacing Threshold Pulse Width: 1 ms
Lead Channel Sensing Intrinsic Amplitude: 0.5 mV
Lead Channel Sensing Intrinsic Amplitude: 0.5 mV
Lead Channel Sensing Intrinsic Amplitude: 1 mV
Lead Channel Sensing Intrinsic Amplitude: 1 mV
Lead Channel Setting Pacing Amplitude: 1.75 V
Lead Channel Setting Pacing Amplitude: 2 V
Lead Channel Setting Pacing Amplitude: 2 V
Lead Channel Setting Pacing Pulse Width: 0.4 ms
Lead Channel Setting Pacing Pulse Width: 1 ms
Lead Channel Setting Sensing Sensitivity: 0.3 mV

## 2023-06-15 ENCOUNTER — Ambulatory Visit: Payer: Self-pay | Admitting: Cardiology

## 2023-06-20 ENCOUNTER — Ambulatory Visit (HOSPITAL_COMMUNITY): Payer: Self-pay | Admitting: Pharmacist

## 2023-06-20 LAB — POCT INR: INR: 2.1 (ref 2.0–3.0)

## 2023-06-27 ENCOUNTER — Ambulatory Visit (HOSPITAL_COMMUNITY): Payer: Self-pay | Admitting: Pharmacist

## 2023-06-27 LAB — POCT INR: INR: 2 (ref 2.0–3.0)

## 2023-07-04 ENCOUNTER — Other Ambulatory Visit: Payer: Self-pay

## 2023-07-04 ENCOUNTER — Encounter: Payer: Self-pay | Admitting: Infectious Disease

## 2023-07-04 ENCOUNTER — Ambulatory Visit (HOSPITAL_COMMUNITY): Payer: Self-pay | Admitting: Pharmacist

## 2023-07-04 ENCOUNTER — Ambulatory Visit: Payer: Medicare Other | Admitting: Infectious Disease

## 2023-07-04 VITALS — HR 91 | Ht 67.0 in | Wt 180.0 lb

## 2023-07-04 DIAGNOSIS — I509 Heart failure, unspecified: Secondary | ICD-10-CM | POA: Diagnosis not present

## 2023-07-04 DIAGNOSIS — B952 Enterococcus as the cause of diseases classified elsewhere: Secondary | ICD-10-CM | POA: Diagnosis not present

## 2023-07-04 DIAGNOSIS — Z95811 Presence of heart assist device: Secondary | ICD-10-CM | POA: Diagnosis not present

## 2023-07-04 DIAGNOSIS — D8685 Sarcoid myocarditis: Secondary | ICD-10-CM

## 2023-07-04 DIAGNOSIS — T827XXD Infection and inflammatory reaction due to other cardiac and vascular devices, implants and grafts, subsequent encounter: Secondary | ICD-10-CM

## 2023-07-04 DIAGNOSIS — R7881 Bacteremia: Secondary | ICD-10-CM | POA: Diagnosis not present

## 2023-07-04 DIAGNOSIS — A498 Other bacterial infections of unspecified site: Secondary | ICD-10-CM

## 2023-07-04 DIAGNOSIS — T827XXA Infection and inflammatory reaction due to other cardiac and vascular devices, implants and grafts, initial encounter: Secondary | ICD-10-CM

## 2023-07-04 DIAGNOSIS — I63511 Cerebral infarction due to unspecified occlusion or stenosis of right middle cerebral artery: Secondary | ICD-10-CM

## 2023-07-04 LAB — POCT INR: INR: 1.6 — AB (ref 2.0–3.0)

## 2023-07-04 MED ORDER — AMOXICILLIN 500 MG PO TABS: 1000.0000 mg | ORAL_TABLET | Freq: Two times a day (BID) | ORAL | 3 refills | Status: DC

## 2023-07-04 NOTE — Progress Notes (Signed)
 Subjective:  Chief complaint: follow-up for LVAD infection, E faecalis bacteremia   Patient ID: Charles Holmes, male    DOB: 07-03-1965, 58 y.o.   MRN: [id number]  HPI   Discussed the use of AI scribe software for clinical note transcription with the patient, who gave verbal consent to proceed.  History of Present Illness   Charles Holmes is a 58 year old male with sarcoidosis and heart failure who presents for follow-up of a bloodstream infection in context of LVAD.  He developed an Enterococcus faecalis bloodstream infection following the placement of his LVAD in August 2024. He completed dual beta-lactam therapy with ampicillin  and ceftriaxone  and is now on high-dose amoxicillin  1000 mg twice daily as lifelong suppressive therapy due to the LVAD.  The LVAD driveline site appears healthy, with some itching managed by topical cream. He follows up with cardiology every three months. No current symptoms of feverare present. Recent fluid retention was managed with diuretics.       Review of Systems  Constitutional:  Negative for activity change, appetite change, chills, diaphoresis, fatigue, fever and unexpected weight change.  HENT:  Negative for congestion, rhinorrhea, sinus pressure, sneezing, sore throat and trouble swallowing.   Eyes:  Negative for photophobia and visual disturbance.  Respiratory:  Negative for cough, chest tightness, shortness of breath, wheezing and stridor.   Cardiovascular:  Negative for chest pain, palpitations and leg swelling.  Gastrointestinal:  Negative for abdominal distention, abdominal pain, anal bleeding, blood in stool, constipation, diarrhea, nausea and vomiting.  Genitourinary:  Negative for difficulty urinating, dysuria, flank pain and hematuria.  Musculoskeletal:  Negative for arthralgias, back pain, gait problem, joint swelling and myalgias.  Skin:  Negative for color change, pallor, rash and wound.  Neurological:  Negative for dizziness,  tremors, weakness and light-headedness.  Hematological:  Negative for adenopathy. Does not bruise/bleed easily.  Psychiatric/Behavioral:  Negative for agitation, behavioral problems, confusion, decreased concentration, dysphoric mood and sleep disturbance.        Objective:   Physical Exam Constitutional:      Appearance: He is well-developed.  HENT:     Head: Normocephalic and atraumatic.   Eyes:     Conjunctiva/sclera: Conjunctivae normal.    Cardiovascular:     Rate and Rhythm: Normal rate and regular rhythm.  Pulmonary:     Effort: Pulmonary effort is normal. No respiratory distress.     Breath sounds: No wheezing.  Abdominal:     General: There is no distension.     Palpations: Abdomen is soft.   Musculoskeletal:        General: No tenderness. Normal range of motion.     Cervical back: Normal range of motion and neck supple.   Skin:    General: Skin is warm and dry.     Coloration: Skin is not pale.     Findings: No erythema or rash.   Neurological:     General: No focal deficit present.     Mental Status: He is alert and oriented to person, place, and time.   Psychiatric:        Mood and Affect: Mood normal.        Behavior: Behavior normal.        Thought Content: Thought content normal.        Judgment: Judgment normal.           Assessment & Plan:   Assessment and Plan    Enterococcus faecalis bacteremia Post-LVAD Enterococcus faecalis bacteremia  managed with dual beta-lactam therapy, transitioned to lifelong suppressive high-dose amoxicillin  due to recurrence risk. - Continue high-dose amoxicillin  1000 mg twice daily indefinitely. - Send refills for amoxicillin  to [facility]. - Follow up in six months to monitor condition and adjust treatment as necessary. --monitor CMP , CBC at least yearly  Sarcoidosis with heart involvement Sarcoidosis with cardiac involvement leading to heart failure, monitored by cardiology.  Heart failure with  LVAD Heart failure managed with LVAD since August 2024. Driveline site healthy. - Regular follow-up with cardiology every three months.  Stroke during LVAD surgery Stroke during LVAD surgery.

## 2023-07-10 ENCOUNTER — Other Ambulatory Visit (HOSPITAL_COMMUNITY): Payer: Self-pay | Admitting: Internal Medicine

## 2023-07-11 ENCOUNTER — Ambulatory Visit (HOSPITAL_COMMUNITY): Payer: Self-pay | Admitting: Pharmacist

## 2023-07-11 DIAGNOSIS — Z7901 Long term (current) use of anticoagulants: Secondary | ICD-10-CM | POA: Diagnosis not present

## 2023-07-11 DIAGNOSIS — Z95811 Presence of heart assist device: Secondary | ICD-10-CM | POA: Diagnosis not present

## 2023-07-11 LAB — POCT INR: INR: 1.8 — AB (ref 2.0–3.0)

## 2023-07-13 ENCOUNTER — Other Ambulatory Visit (HOSPITAL_COMMUNITY): Payer: Self-pay

## 2023-07-13 DIAGNOSIS — Z95811 Presence of heart assist device: Secondary | ICD-10-CM

## 2023-07-13 DIAGNOSIS — Z7901 Long term (current) use of anticoagulants: Secondary | ICD-10-CM

## 2023-07-13 MED ORDER — SERTRALINE HCL 50 MG PO TABS
50.0000 mg | ORAL_TABLET | Freq: Every day | ORAL | 3 refills | Status: AC
  Filled 2023-10-04: qty 90, 90d supply, fill #0
  Filled 2023-12-31: qty 90, 90d supply, fill #1

## 2023-07-13 MED ORDER — ATORVASTATIN CALCIUM 80 MG PO TABS: 80.0000 mg | ORAL_TABLET | Freq: Every day | ORAL | 3 refills | Status: DC

## 2023-07-18 ENCOUNTER — Ambulatory Visit (HOSPITAL_COMMUNITY): Payer: Self-pay | Admitting: Pharmacist

## 2023-07-18 LAB — POCT INR: INR: 1.6 — AB (ref 2.0–3.0)

## 2023-07-19 ENCOUNTER — Other Ambulatory Visit (HOSPITAL_COMMUNITY): Payer: Self-pay | Admitting: Unknown Physician Specialty

## 2023-07-19 DIAGNOSIS — Z7901 Long term (current) use of anticoagulants: Secondary | ICD-10-CM

## 2023-07-19 DIAGNOSIS — Z95811 Presence of heart assist device: Secondary | ICD-10-CM

## 2023-07-19 MED ORDER — ATORVASTATIN CALCIUM 80 MG PO TABS
80.0000 mg | ORAL_TABLET | Freq: Every day | ORAL | 3 refills | Status: AC
  Filled 2023-09-11 – 2023-10-22 (×2): qty 90, 90d supply, fill #0
  Filled 2024-01-17: qty 90, 90d supply, fill #1

## 2023-07-24 ENCOUNTER — Ambulatory Visit (INDEPENDENT_AMBULATORY_CARE_PROVIDER_SITE_OTHER): Payer: Medicare Other | Admitting: Neurology

## 2023-07-24 ENCOUNTER — Encounter: Payer: Self-pay | Admitting: Neurology

## 2023-07-24 VITALS — Ht 67.0 in | Wt 182.0 lb

## 2023-07-24 DIAGNOSIS — Z95811 Presence of heart assist device: Secondary | ICD-10-CM | POA: Diagnosis not present

## 2023-07-24 DIAGNOSIS — Z8673 Personal history of transient ischemic attack (TIA), and cerebral infarction without residual deficits: Secondary | ICD-10-CM

## 2023-07-24 NOTE — Progress Notes (Signed)
 [facility] [address] [place]. [place] [address] [phone]       OFFICE FOLLOW-UP NOTE  Charles Holmes Date of Birth:  [birth year] Medical Record Number:  [id number]   HPI: Initial note 11/30/2022 Charles Holmes is a 58 year old Caucasian male seen today for office follow-up visit following hospital admission for stroke in June and August 2024.  History is obtained from the patient and review of electronic medical records.  I personally reviewed pertinent available imaging films in PACS. 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  He initially presented on 07/10/2022 to [facility] and transferred from [facility] where he presented with acute onset left-sided weakness.  CT head was negative with aspect score of 10.  Teleneurology was consulted and he was given IV tPA and transferred to [facility] where CT angiogram revealed acute right M1 occlusion.  Patient was taken for emergent mechanical thrombectomy.  NIH stroke scale was 6 [facility] but had worsened to 8 upon arrival here.  Patient was kept in the ICU and blood pressure tightly controlled.  He did well and was extubated.  MRI showed patchy right insular cortex, temporal lobe and basal ganglia infarcts.  Patient was started on Eliquis  for his atrial fibrillation.  2D echo showed ejection fraction less than 20%.  ICD interrogation did not reveal any A-fib.  LDL cholesterol 59 mg percent.  Hemoglobin A1c of 5.1.  He did well and in fact was discharged with outpatient occupational therapy and no need for PT or sleep therapy.SABRA   He presented again on 08/30/2022 for cardiogenic shock and underwent LVAD placement emergently.  Immediate postprocedure he was noted to be moving all extremities well and went to bed around  midnight.  As sedation was being weaned off in anticipation of extubation it was noted that he was unable to move the left upper and lower extremity and a code stroke was activated.  On initial exam by neurohospitalist he was found to have disconjugate gaze with left to the right arm and right leg but had no movement on the left side.  Code stroke CT head showed no hemorrhage but did show developing right MCA stroke with aspect score of 7.  CT angiogram again showed right M1 occlusion CT perfusion showed a core infarct of 12 cc and penumbra of 90 cc.  Patient was again taken for emergent mechanical thrombectomy after discussion with family.  NIH stroke scale was 17.  Procedure went well and patient had excellent revascularization.  He was extubated the next day.  Echocardiogram showed ejection fraction of 15% with severe left ventricular decrease systolic function with global hypokinesis.  LDL cholesterol 51 mg percent.  Hemoglobin A1c of 5.5.  INR is 1.4 on admission.  Patient was switched to Eliquis .  He was extubated and had mild left-sided weakness.  He was seen by therapies and transferred to inpatient rehab.  Patient states he is doing well.  He is currently living with his cousin and his wife.  He is finished therapies and can walk independently without assistance.  He still has some mild weakness in the left grip and at times with left leg drags.  He plans to go home and live alone by himself tomorrow.  He is now on warfarin and he plans to get her INR machine to check it regularly at  home.  Patient's hospital course was complicated by Underwent LVAD implant on 08/13 + clipping left atrial appendage d/t severe thickening and invagination of mitral valve annulus impeding flows. Apical core sent to pathology - no mention of sarcoid.Post VAD implant c/b left-sided hemiplegia. CT head 8/14 with acute R MCA infarct. Taken to IR for thrombectomy. F/u head CT with small hemorrhagic conversion. Had some residual  deficits w/ mild left sided weakness but otherwise recovered. Course further c/b enterococcus faecalis bacteremia w/ 2/2 + cultures on 9/6. ID consulted. Recommended ampicillin  and ceftriaxone . PICC removed. Repeat BC X 2 negative but he has been asked by ID to continue on amoxicillin  1000 mg twice daily indefinitely. Update 07/24/2023 : He returns for follow-up after last visit 6 months ago.  He states he is doing well from neurovascular standpoint.  He is not having recurrent stroke or TIA symptoms.  He was recently admitted in April with hematuria and since then INR goal has been reduced to between 1.5 and 2.0.  His last INR on 07/18/2023 was 1.6.  Does complain of some minor headaches for the last few days but he attributes this to being out in the sun.  He is taken Tylenol  which seems to help.  He still has some residual left hand weakness and diminished fine motor skills.  He has trouble putting objects for long and at times can drop objects.  He does get tired easily.  He remains on Lipitor  for his tolerating well without side effects.  He does have regular follow-up at the heart failure clinic with Dr. Cherrie.  He has no new neurological complaints. 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    Stroke (HCC)    Systolic heart failure (HCC) 2021   LVEF 18%, RVEF 38% on cardiac MRI 12/19/2019. possible cardiac sarcoidosis.   Wide-complex tachycardia 2021   wears LifeVest    Social History:  Social History   Socioeconomic History   Marital status: Divorced    Spouse name: Not on file   Number of children: Not on file   Years of education: Not on file   Highest education level: Not on file  Occupational History   Not on file  Tobacco Use   Smoking status: Former    Types: Cigarettes   Smokeless tobacco: Never   Tobacco comments:    Quit smoking in 2008  Vaping Use   Vaping status: Never Used  Substance and Sexual Activity   Alcohol  use: Not Currently    Alcohol /week: 6.0 standard drinks of alcohol     Types: 6 Cans of beer per week    Comment: occasional mixed drink   Drug use: Yes    Types: Marijuana    Comment: stopped using months ago  d/t how it affected breathing   Sexual activity: Not Currently  Other Topics Concern   Not on file  Social History Narrative   Not on file   Social Drivers of Health   Financial Resource Strain: Low Risk  (07/12/2022)   Overall Financial Resource Strain (CARDIA)    Difficulty of Paying Living Expenses: Not hard at all  Food Insecurity: Food Insecurity Present (05/11/2023)   Hunger Vital Sign    Worried About Running Out of Food in the Last Year: Sometimes true    Ran Out of Food in the Last Year: Often true  Transportation Needs: No Transportation Needs (05/11/2023)   PRAPARE - Administrator, Civil Service (Medical): No    Lack of Transportation (Non-Medical): No  Physical Activity: Not on file  Stress: Not on file  Social Connections: Not on file  Intimate Partner Violence: Not At Risk (05/11/2023)   Humiliation, Afraid, Rape, and Kick questionnaire    Fear of Current or Ex-Partner: No    Emotionally Abused: No    Physically Abused: No    Sexually Abused: No    Medications:   Current Outpatient Medications  on File Prior to Visit  Medication Sig Dispense Refill   amoxicillin  (AMOXIL ) 500 MG tablet Take 2 tablets (1,000 mg total) by mouth 2 (two) times daily. 360 tablet 3   aspirin  EC 81 MG tablet Take 1 tablet (81 mg total) by mouth daily. Swallow whole. 120 tablet 0   atorvastatin  (LIPITOR ) 80 MG tablet Take 1 tablet (80 mg total) by mouth daily. 90 tablet 3   fluticasone-salmeterol (ADVAIR  HFA) 230-21 MCG/ACT inhaler Inhale 2 puffs into the lungs 2 (two) times daily. 1 each 12   melatonin 5 MG TABS Take 5 mg by mouth at bedtime.     mexiletine (MEXITIL ) 250 MG capsule TAKE 1 CAPSULE BY MOUTH 2 TIMES DAILY. 180 capsule 3   nystatin  cream (MYCOSTATIN ) Apply 1 Application topically 2 (two) times daily as needed (itching). 30 g 3   pantoprazole  (PROTONIX ) 40 MG tablet Take 1 tablet (40 mg total) by mouth daily before breakfast. 30 tablet 0   sertraline  (ZOLOFT ) 50 MG tablet Take 1 tablet (50 mg total) by mouth daily. 90 tablet 3   sildenafil  (REVATIO ) 20 MG tablet Take 1 tablet (20 mg total) by mouth 3 (three) times daily. 90 tablet 11   warfarin (COUMADIN ) 3 MG tablet Take 3 mg (1 tablet) daily or as directed by the advanced heart failure clinic 180 tablet 3   No current facility-administered medications on file prior to visit.    Allergies:   Allergies  Allergen Reactions   Pacerone  [Amiodarone ] Other (See Comments)    Severe tremors   Oxycodone  Itching    Pt knows as percocet    Physical Exam General: Frail middle-age Caucasian male, seated, in no evident distress Head: head normocephalic and atraumatic.  Neck: supple with no carotid or supraclavicular bruits Cardiovascular: regular rate and rhythm, no murmurs Musculoskeletal: no deformity Skin:  no rash/petichiae Vascular:  Normal pulses all extremities Vitals:   Neurologic Exam Mental Status: Awake and fully alert. Oriented to place and time. Recent and remote memory intact. Attention span, concentration and fund of knowledge  appropriate. Mood and affect appropriate.  Cranial Nerves: Fundoscopic exam reveals sharp disc margins. Pupils equal, briskly reactive to light. Extraocular movements full without nystagmus. Visual fields full to confrontation. Hearing intact. Facial sensation intact. Face, tongue, palate moves normally and symmetrically.  Motor: Normal  bulk and tone. Normal strength in all tested extremity muscles.  Mild left grip weakness.  Diminished fine finger movements on the left.  Orbits right over left upper extremity.  Tone increased slightly in the left leg. Sensory.: intact to touch ,pinprick .position and vibratory sensation.  Coordination: Rapid alternating movements normal in all extremities. Finger-to-nose and heel-to-shin performed accurately bilaterally. Gait and Station: Arises from chair without difficulty. Stance is normal. Gait demonstrates normal stride length and balance but with slight dragging of the left leg..  Not able to heel, toe and tandem walk .  Reflexes: 1+ and symmetric. Toes downgoing.   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  He is doing phenomenally well with only mild residual left hand weakness.     PLAN:I had a long d/w patient about his recent recurrent right MCA stroke and thrombectomy x 2, atrial fibrillation, risk for recurrent stroke/TIAs, personally independently reviewed imaging studies and stroke evaluation results and answered questions.Continue warfarin daily  for secondary stroke prevention with target INR between 1.5 and 2   due to his LVAD device and atrial fibrillation and recent hematuria and maintain strict control of hypertension with blood pressure goal below  130/90, diabetes with hemoglobin A1c goal below 6.5% and lipids with LDL cholesterol goal below 70 mg/dL. I also advised the patient to eat a healthy diet with plenty of whole grains, cereals, fruits and vegetables, exercise regularly and maintain ideal body weight .patient is doing quite well neurologically and is cleared to drive his car.  Continue close follow-up with Dr. Cherrie in the heart failure clinic. Return for f/u only as needed.owup in the future with me in 6 months or call earlier if necessary. Greater than 50% of time during this 35 minute visit was spent on counseling,explanation of diagnosis embolic strokes, thrombectomy, heart failure, LVAD device,warfarin anticoagulation, planning of further management, discussion with patient and family and coordination of care Eather Popp, MD Note: This document was prepared with digital dictation and possible smart phrase technology. Any transcriptional errors that result from this process are unintentional

## 2023-07-24 NOTE — Patient Instructions (Signed)
 I had a long d/w patient about his recent recurrent right MCA stroke and thrombectomy x 2, atrial fibrillation, risk for recurrent stroke/TIAs, personally independently reviewed imaging studies and stroke evaluation results and answered questions.Continue warfarin daily  for secondary stroke prevention with target INR between 1.5 and 2   due to his LVAD device and atrial fibrillation and recent hematuria and maintain strict control of hypertension with blood pressure goal below 130/90, diabetes with hemoglobin A1c goal below 6.5% and lipids with LDL cholesterol goal below 70 mg/dL. I also advised the patient to eat a healthy diet with plenty of whole grains, cereals, fruits and vegetables, exercise regularly and maintain ideal body weight .patient is doing quite well neurologically and is cleared to drive his car.  Continue close follow-up with Dr. Cherrie in the heart failure clinic. Return for f/u only as needed.

## 2023-07-25 ENCOUNTER — Ambulatory Visit (HOSPITAL_COMMUNITY): Payer: Self-pay | Admitting: Pharmacist

## 2023-07-25 LAB — POCT INR: INR: 1.6 — AB (ref 2.0–3.0)

## 2023-08-01 ENCOUNTER — Encounter (HOSPITAL_BASED_OUTPATIENT_CLINIC_OR_DEPARTMENT_OTHER): Payer: Self-pay | Admitting: Student

## 2023-08-01 ENCOUNTER — Ambulatory Visit (HOSPITAL_BASED_OUTPATIENT_CLINIC_OR_DEPARTMENT_OTHER): Payer: Self-pay | Admitting: Student

## 2023-08-01 ENCOUNTER — Other Ambulatory Visit (HOSPITAL_COMMUNITY): Payer: Self-pay

## 2023-08-01 ENCOUNTER — Ambulatory Visit (HOSPITAL_COMMUNITY): Payer: Self-pay | Admitting: Pharmacist

## 2023-08-01 VITALS — BP 142/78 | HR 80 | Temp 97.9°F | Resp 16 | Ht 66.54 in | Wt 178.1 lb

## 2023-08-01 DIAGNOSIS — Z95811 Presence of heart assist device: Secondary | ICD-10-CM | POA: Diagnosis not present

## 2023-08-01 DIAGNOSIS — I480 Paroxysmal atrial fibrillation: Secondary | ICD-10-CM | POA: Diagnosis not present

## 2023-08-01 DIAGNOSIS — A498 Other bacterial infections of unspecified site: Secondary | ICD-10-CM | POA: Diagnosis not present

## 2023-08-01 DIAGNOSIS — Z7901 Long term (current) use of anticoagulants: Secondary | ICD-10-CM

## 2023-08-01 DIAGNOSIS — Z8673 Personal history of transient ischemic attack (TIA), and cerebral infarction without residual deficits: Secondary | ICD-10-CM

## 2023-08-01 DIAGNOSIS — I10 Essential (primary) hypertension: Secondary | ICD-10-CM | POA: Diagnosis not present

## 2023-08-01 DIAGNOSIS — J449 Chronic obstructive pulmonary disease, unspecified: Secondary | ICD-10-CM | POA: Diagnosis not present

## 2023-08-01 DIAGNOSIS — E782 Mixed hyperlipidemia: Secondary | ICD-10-CM | POA: Diagnosis not present

## 2023-08-01 DIAGNOSIS — I69354 Hemiplegia and hemiparesis following cerebral infarction affecting left non-dominant side: Secondary | ICD-10-CM | POA: Insufficient documentation

## 2023-08-01 DIAGNOSIS — I5022 Chronic systolic (congestive) heart failure: Secondary | ICD-10-CM | POA: Diagnosis not present

## 2023-08-01 DIAGNOSIS — Z7689 Persons encountering health services in other specified circumstances: Secondary | ICD-10-CM | POA: Diagnosis not present

## 2023-08-01 LAB — POCT INR: INR: 1.5 — AB (ref 2.0–3.0)

## 2023-08-01 NOTE — Assessment & Plan Note (Signed)
 Chronic, stable. Last LDL was at goal of <70.  - Continue atorvastatin  80mg .

## 2023-08-01 NOTE — Assessment & Plan Note (Addendum)
 Residual effects of R MCA history. Only has some residual left sided weakness.

## 2023-08-01 NOTE — Patient Instructions (Addendum)
 It was nice to see you today!  If you have any problems before your next visit feel free to message me via MyChart (minor issues or questions) or call the office, otherwise you may reach out to schedule an office visit.  Thank you! Pau Banh, PA-C

## 2023-08-01 NOTE — Assessment & Plan Note (Signed)
 Stable. Follows with ID- Dr. Fleeta Rothman- Continue. - Patient is on chronic amoxicillin  therapy for prophylactic therapy.

## 2023-08-01 NOTE — Assessment & Plan Note (Signed)
 He has an LVAD due to cardiogenic shock and heart failure. There have been issues with blood pressure measurement, requiring a Doppler. He is on lifelong amoxicillin  to prevent infection due to previous blood invasion from a PICC line. He is advised to keep bleed stop at home for emergencies due to his anticoagulation therapy. - Order Doppler for blood pressure measurement - Continue amoxicillin  as prescribed - Regular follow-up with infectious disease specialist - Keep bleed stop at home for emergencies

## 2023-08-01 NOTE — Assessment & Plan Note (Signed)
 His echocardiogram showed an ejection fraction of less than 20%, which decreased to 15% after the second stroke, indicating worsening heart failure. He has an LVAD in place and experiences fatigue and fluid retention, particularly in the lungs. He is on a low-salt diet to manage fluid retention. He monitors his weight to manage fluid status and is aware of the importance of avoiding excessive salt intake. - Monitor fluid status and weight daily - Continue low-salt diet - Follow up with cardiologist regularly - Rales to RLL of lung suggest slight fluid overload today- continue lasix  prn

## 2023-08-01 NOTE — Assessment & Plan Note (Addendum)
 LDL goal < 70. He experienced a right MCA stroke in June and August of the previous year, resulting in mild residual left arm weakness and occasional left leg dragging, which has improved. No significant facial drooping or swallowing issues. He expressed concern about the possibility of another stroke and the difficulty of enduring another similar event. - Continue Eliquis  as prescribed - Continue physical therapy

## 2023-08-01 NOTE — Assessment & Plan Note (Signed)
 Appears stable on Advair . Cannot find a more recent pulmonology visit since 2022, will plan to enquire further at next visit. - Continue current regimen.

## 2023-08-01 NOTE — Assessment & Plan Note (Addendum)
 Chronic issue although recently remaining in NSR, stable on warfarin. - Continue warfarin with goal INR of 1.5- 2.0.

## 2023-08-01 NOTE — Progress Notes (Signed)
 New Patient Office Visit  Subjective    Patient ID: Charles Holmes, male    DOB: 04/09/1965  Age: 58 y.o. MRN: [id number]  CC:  Chief Complaint  Patient presents with   Establish Care    Here to establish care.     Discussed the use of AI scribe software for clinical note transcription with the patient, who gave verbal consent to proceed.  History of Present Illness   Charles Holmes is a 58 year old male with a history of stroke and heart failure who presents for establishment of care.  Based largely on chart review and supplemented with patient hx: He has a history of two right MCA strokes, the first in June of the previous year and the second in August. The initial stroke resulted in left-sided weakness. A CT scan was initially negative, with an ASPECT score of ten- they ended up administering TPA. A subsequent CTA revealed a right M1 occlusion, necessitating an emergent thrombectomy. An MRI showed patchy infarcts in the right insular cortex, temporal lobe, and basal ganglia. He was started on Eliquis  at that time. The second stroke also involved a right M1 occlusion, and another thrombectomy was performed. He has residual left arm weakness and occasional left leg dragging, though the latter has improved. He has some difficulty with swallowing.  He has an LVAD placed following cardiogenic shock in August of the previous year. His ejection fraction was initially less than 20% and decreased to 15% after the second stroke. He experienced an infection around the LVAD site that led to bacteremia, suspected to have originated from a PICC line. The infection was managed with the assistance of infectious disease specialists, and he is currently on daily amoxicillin  to manage bacterial load due to the presence of the LVAD. He experiences significant fatigue and occasional shortness of breath, particularly when exposed to humidity. No chest pain, numbness, or tingling. He is on warfarin,  managed by his pharmacist, with weekly INR checks. He has an INR machine at home and reports his INR was 1.5 this morning.  In April, he experienced hematuria, described as 'nothing but blood' for a period. A cystoscopy was performed, which did not reveal any abnormalities. A stent was placed, but he experienced bleeding upon standing, which resolved after the stent was removed. He reports no current issues with hematuria.  He has a history of glaucoma, which is relevant for medication management. He also uses inhalers and has a history of smoking, with concerns about potential COPD. He monitors his fluid intake and salt consumption to manage fluid retention, which can be exacerbated by salty foods. He mentions a potential diagnosis of sarcoidosis affecting his heart, unsure if this is confirmed- do not believe it is biopsy confirmed at least.      Screenings:  Colon Cancer: indicated at next visit Lung Cancer: Low dose CT of Chest indicated at next visit Diabetes: last A1c normal- 5.5%. Due for update HLD: well controlled on lipitor  80mg .   Outpatient Encounter Medications as of 08/01/2023  Medication Sig   acetaminophen  (TYLENOL ) 325 MG tablet Take 650 mg by mouth as needed (2-3 TABS AS NEEDED).   amoxicillin  (AMOXIL ) 500 MG tablet Take 2 tablets (1,000 mg total) by mouth 2 (two) times daily.   aspirin  EC 81 MG tablet Take 1 tablet (81 mg total) by mouth daily. Swallow whole.   atorvastatin  (LIPITOR ) 80 MG tablet Take 1 tablet (80 mg total) by mouth daily.   fluticasone-salmeterol (  ADVAIR  HFA) 230-21 MCG/ACT inhaler Inhale 2 puffs into the lungs 2 (two) times daily.   latanoprost  (XALATAN ) 0.005 % ophthalmic solution Place 1 drop into both eyes at bedtime.   melatonin 5 MG TABS Take 5 mg by mouth at bedtime. (Patient taking differently: Take 5 mg by mouth at bedtime as needed.)   mexiletine (MEXITIL ) 250 MG capsule TAKE 1 CAPSULE BY MOUTH 2 TIMES DAILY.   nystatin  cream (MYCOSTATIN ) Apply 1  Application topically 2 (two) times daily as needed (itching).   pantoprazole  (PROTONIX ) 40 MG tablet Take 1 tablet (40 mg total) by mouth daily before breakfast.   sertraline  (ZOLOFT ) 50 MG tablet Take 1 tablet (50 mg total) by mouth daily.   sildenafil  (REVATIO ) 20 MG tablet Take 1 tablet (20 mg total) by mouth 3 (three) times daily.   traZODone  (DESYREL ) 100 MG tablet Take 100 mg by mouth at bedtime as needed.   warfarin (COUMADIN ) 3 MG tablet Take 3 mg (1 tablet) daily or as directed by the advanced heart failure clinic   dorzolamide -timolol  (COSOPT ) 2-0.5 % ophthalmic solution Place 1 drop into both eyes 2 (two) times daily. (Patient not taking: Reported on 08/01/2023)   No facility-administered encounter medications on file as of 08/01/2023.    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   Stroke (HCC)    Systolic heart failure (HCC) 2021   LVEF 18%, RVEF 38% on cardiac MRI 12/19/2019. possible cardiac sarcoidosis.   Wide-complex tachycardia 2021   wears LifeVest    Past Surgical History:  Procedure Laterality Date   BIV UPGRADE N/A 06/07/2021   Procedure: BIV ICD UPGRADE;  Surgeon: Inocencio Soyla Lunger, MD;  Location: [facility] INVASIVE CV LAB;  Service: Cardiovascular;  Laterality: N/A;   CLIPPING OF ATRIAL APPENDAGE Left 08/29/2022   Procedure: CLIPPING OF ATRIAL APPENDAGE;  Surgeon: Obadiah Coy, MD;  Location: [facility] OR;  Service: Open Heart  Surgery;  Laterality: Left;   CYSTOSCOPY WITH RETROGRADE PYELOGRAM, URETEROSCOPY AND STENT PLACEMENT Right 04/17/2023   Procedure: CYSTOSCOPY WITH RETROGRADE PYELOGRAM; DIAGNOSTIC RIGHT URETEROSCOPY, RIGHT URETERAL WASHINGS AND STENT INSERTION;  Surgeon: Elisabeth Valli BIRCH, MD;  Location: [facility] OR;  Service: Urology;  Laterality: Right;  CYSTOSCOPY, BILATERAL RETROGRADE PYELOGRAM, RIGHT DIAGNOSTIC URETEROSCOPY, POSSIBLE BIOPSY, POSSIBLE STENT, POSSIBLE LEFT DIAGNOSTIC URETERSCOPY, POSSIBLE BIOPSY   CYSTOSCOPY/URETEROSCOPY/HOLMIUM LASER/STENT PLACEMENT Right 05/11/2023   Procedure: CYSTOSCOPY/URETEROSCOPY/STENT PLACEMENT;  Surgeon: Elisabeth Valli BIRCH, MD;  Location: [facility] OR;  Service: Urology;  Laterality: Right;  CYSTOSCOPY/RIGHT DIAGNOSTIC URETEROSCOPY/BIOPSY/HOLMIUM LASER/STENT EXCHANGE/RETROGRADE PYELOGRAM   IABP INSERTION N/A 08/25/2022   Procedure: IABP Insertion;  Surgeon: Cherrie Toribio SAUNDERS, MD;  Location: [facility] INVASIVE CV LAB;  Service: Cardiovascular;  Laterality: N/A;   ICD IMPLANT N/A 02/20/2020   Procedure: ICD IMPLANT;  Surgeon: Inocencio Soyla Lunger, MD;  Location: [facility] INVASIVE CV LAB;  Service: Cardiovascular;  Laterality: N/A;   INSERTION OF IMPLANTABLE LEFT VENTRICULAR ASSIST DEVICE N/A 08/29/2022  Procedure: INSERTION OF MEDTRONIC IMPLANTABLE LEFT VENTRICULAR ASSIST DEVICE;  Surgeon: Obadiah Coy, MD;  Location: [facility] OR;  Service: Open Heart Surgery;  Laterality: N/A;   IR CT HEAD LTD  07/10/2022   IR CT HEAD LTD  08/30/2022   IR PERCUTANEOUS ART THROMBECTOMY/INFUSION INTRACRANIAL INC DIAG ANGIO  07/10/2022   IR PERCUTANEOUS ART THROMBECTOMY/INFUSION INTRACRANIAL INC DIAG ANGIO  08/30/2022   IR US  GUIDE VASC ACCESS LEFT  08/30/2022   IR US  GUIDE VASC ACCESS RIGHT  07/10/2022   RADIOLOGY WITH ANESTHESIA N/A 07/10/2022   Procedure: RADIOLOGY WITH ANESTHESIA;  Surgeon: Radiologist, Medication, MD;  Location: [facility] OR;  Service: Radiology;  Laterality: N/A;   RADIOLOGY WITH ANESTHESIA N/A  08/30/2022   Procedure: IR WITH ANESTHESIA;  Surgeon: Dolphus Carrion, MD;  Location: [facility] OR;  Service: Radiology;  Laterality: N/A;   REMOVAL OF IMPELLA LEFT VENTRICULAR ASSIST DEVICE N/A 08/29/2022   Procedure: REMOVAL OF INTRA-AORTIC BALLON PUMP;  Surgeon: Obadiah Coy, MD;  Location: [facility] OR;  Service: Open Heart Surgery;  Laterality: N/A;   RIGHT HEART CATH N/A 07/14/2022   Procedure: RIGHT HEART CATH;  Surgeon: Cherrie Toribio SAUNDERS, MD;  Location: [facility] INVASIVE CV LAB;  Service: Cardiovascular;  Laterality: N/A;   RIGHT HEART CATH N/A 08/25/2022   Procedure: RIGHT HEART CATH;  Surgeon: Cherrie Toribio SAUNDERS, MD;  Location: [facility] INVASIVE CV LAB;  Service: Cardiovascular;  Laterality: N/A;   RIGHT HEART CATH N/A 09/12/2022   Procedure: RIGHT HEART CATH;  Surgeon: Rolan Ezra RAMAN, MD;  Location: [facility] INVASIVE CV LAB;  Service: Cardiovascular;  Laterality: N/A;   RIGHT/LEFT HEART CATH AND CORONARY ANGIOGRAPHY N/A 12/16/2019   Procedure: RIGHT/LEFT HEART CATH AND CORONARY ANGIOGRAPHY;  Surgeon: Swaziland, Peter M, MD;  Location: [facility] INVASIVE CV LAB;  Service: Cardiovascular;  Laterality: N/A;   RIGHT/LEFT HEART CATH AND CORONARY ANGIOGRAPHY N/A 08/10/2022   Procedure: RIGHT/LEFT HEART CATH AND CORONARY ANGIOGRAPHY;  Surgeon: Cherrie Toribio SAUNDERS, MD;  Location: [facility] INVASIVE CV LAB;  Service: Cardiovascular;  Laterality: N/A;   TEE WITHOUT CARDIOVERSION N/A 08/29/2022   Procedure: TRANSESOPHAGEAL ECHOCARDIOGRAM;  Surgeon: Obadiah Coy, MD;  Location: [facility] OR;  Service: Open Heart Surgery;  Laterality: N/A;   TOOTH EXTRACTION N/A 08/22/2022   Procedure: DENTAL RESTORATION/EXTRACTIONS;  Surgeon: Sheryle Hamilton, DMD;  Location: [facility] OR;  Service: Oral Surgery;  Laterality: N/A;    Family History  Problem Relation Age of Onset   Heart disease Mother    Hypertension Mother    Hypertension Father    Heart disease Father    Diabetes Father    Stroke Sister        x3   Diabetes Sister     Social History    Socioeconomic History   Marital status: Divorced    Spouse name: Not on file   Number of children: 1   Years of education: Not on file   Highest education level: Not on file  Occupational History   Not on file  Tobacco Use   Smoking status: Former    Current packs/day: 0.00    Average packs/day: 1 pack/day for 33.0 years (33.0 ttl pk-yrs)    Types: Cigarettes    Start date: 81    Quit date: 2018    Years since quitting: 7.5    Passive exposure: Never   Smokeless tobacco: Never   Tobacco comments:    Quit smoking in 2008  Vaping Use   Vaping status: Never Used  Substance and Sexual Activity   Alcohol   use: Not Currently    Alcohol /week: 6.0 standard drinks of alcohol     Types: 6 Cans of beer per week   Drug use: Not Currently    Types: Marijuana    Comment: stopped using months ago d/t how it affected breathing   Sexual activity: Not Currently  Other Topics Concern   Not on file  Social History Narrative   1 daughter    Social Drivers of Corporate investment banker Strain: Low Risk  (07/12/2022)   Overall Financial Resource Strain (CARDIA)    Difficulty of Paying Living Expenses: Not hard at all  Food Insecurity: No Food Insecurity (08/01/2023)   Hunger Vital Sign    Worried About Running Out of Food in the Last Year: Never true    Ran Out of Food in the Last Year: Never true  Recent Concern: Food Insecurity - Food Insecurity Present (05/11/2023)   Hunger Vital Sign    Worried About Running Out of Food in the Last Year: Sometimes true    Ran Out of Food in the Last Year: Often true  Transportation Needs: No Transportation Needs (08/01/2023)   PRAPARE - Administrator, Civil Service (Medical): No    Lack of Transportation (Non-Medical): No  Physical Activity: Not on file  Stress: Not on file  Social Connections: Not on file  Intimate Partner Violence: Not At Risk (08/01/2023)   Humiliation, Afraid, Rape, and Kick questionnaire    Fear of Current or  Ex-Partner: No    Emotionally Abused: No    Physically Abused: No    Sexually Abused: No    ROS  Per HPI      Objective    BP (!) 142/78   Pulse 80   Temp 97.9 F (36.6 C) (Oral)   Resp 16   Ht 5' 6.54 (1.69 m)   Wt 178 lb 1.6 oz (80.8 kg)   SpO2 95%   BMI 28.29 kg/m   Physical Exam Constitutional:      General: He is not in acute distress.    Appearance: Normal appearance. He is not ill-appearing.  HENT:     Head: Normocephalic and atraumatic.     Right Ear: External ear normal.     Left Ear: External ear normal.     Nose: Nose normal.  Eyes:     General: No scleral icterus.    Extraocular Movements: Extraocular movements intact.     Conjunctiva/sclera: Conjunctivae normal.     Pupils: Pupils are equal, round, and reactive to light.  Cardiovascular:     Rate and Rhythm: Normal rate.     Comments: At baseline, controlled on LVAD. Pulmonary:     Effort: Pulmonary effort is normal. No respiratory distress.     Breath sounds: Rales (RLL) present. No wheezing or rhonchi.  Musculoskeletal:        General: Normal range of motion.     Right lower leg: No edema.     Left lower leg: No edema.  Skin:    General: Skin is warm and dry.     Coloration: Skin is not jaundiced or pale.  Neurological:     Mental Status: He is alert.  Psychiatric:        Mood and Affect: Mood normal.        Behavior: Behavior normal.     Last CBC Lab Results  Component Value Date   WBC 7.0 05/25/2023   HGB 13.0 05/25/2023   HCT 39.8 05/25/2023  MCV 95.7 05/25/2023   MCH 31.3 05/25/2023   RDW 14.7 05/25/2023   PLT 184 05/25/2023   Last metabolic panel Lab Results  Component Value Date   GLUCOSE 72 05/25/2023   NA 144 05/25/2023   K 4.4 05/25/2023   CL 107 05/25/2023   CO2 28 05/25/2023   BUN 11 05/25/2023   CREATININE 1.01 05/25/2023   GFRNONAA >60 05/25/2023   CALCIUM  9.2 05/25/2023   PHOS 2.9 09/05/2022   PROT 6.2 (L) 04/16/2023   ALBUMIN  3.7 04/16/2023    BILITOT 1.5 (H) 04/16/2023   ALKPHOS 86 04/16/2023   AST 30 04/16/2023   ALT 27 04/16/2023   ANIONGAP 9 05/25/2023   Last lipids Lab Results  Component Value Date   CHOL 90 08/11/2022   HDL 26 (L) 08/11/2022   LDLCALC 51 08/11/2022   TRIG 64 08/11/2022   CHOLHDL 3.5 08/11/2022   Last hemoglobin A1c Lab Results  Component Value Date   HGBA1C 5.5 08/11/2022        Assessment & Plan:   Encounter to establish care  Primary hypertension Assessment & Plan: Chronic, suspected stable, did not have doppler on hand today.  - Continue current regimen. - Stable at last VAD visit.   LVAD (left ventricular assist device) present [facility]) Assessment & Plan: He has an LVAD due to cardiogenic shock and heart failure. There have been issues with blood pressure measurement, requiring a Doppler. He is on lifelong amoxicillin  to prevent infection due to previous blood invasion from a PICC line. He is advised to keep bleed stop at home for emergencies due to his anticoagulation therapy. - Order Doppler for blood pressure measurement - Continue amoxicillin  as prescribed - Regular follow-up with infectious disease specialist - Keep bleed stop at home for emergencies   Chronic obstructive pulmonary disease, unspecified COPD type (HCC) Assessment & Plan: Appears stable on Advair . Cannot find a more recent pulmonology visit since 2022, will plan to enquire further at next visit. - Continue current regimen.   Hyperlipidemia with target less than 70 Assessment & Plan: Chronic, stable. Last LDL was at goal of <70.  - Continue atorvastatin  80mg .   Chronic systolic congestive heart failure (HCC) Assessment & Plan: His echocardiogram showed an ejection fraction of less than 20%, which decreased to 15% after the second stroke, indicating worsening heart failure. He has an LVAD in place and experiences fatigue and fluid retention, particularly in the lungs. He is on a low-salt diet to manage fluid  retention. He monitors his weight to manage fluid status and is aware of the importance of avoiding excessive salt intake. - Monitor fluid status and weight daily - Continue low-salt diet - Follow up with cardiologist regularly - Rales to RLL of lung suggest slight fluid overload today- continue lasix  prn   Paroxysmal atrial fibrillation (HCC) Assessment & Plan: Chronic issue although recently remaining in NSR, stable on warfarin. - Continue warfarin with goal INR of 1.5- 2.0.   History of CVA (cerebrovascular accident) Assessment & Plan: LDL goal < 70. He experienced a right MCA stroke in June and August of the previous year, resulting in mild residual left arm weakness and occasional left leg dragging, which has improved. No significant facial drooping or swallowing issues. He expressed concern about the possibility of another stroke and the difficulty of enduring another similar event. - Continue Eliquis  as prescribed - Continue physical therapy   Enterococcus faecalis infection Assessment & Plan: Stable. Follows with ID- Dr. Fleeta Rothman- Continue. -  Patient is on chronic amoxicillin  therapy for prophylactic therapy.    I personally spent a total of 68 minutes in the care of the patient today including preparing to see the patient, getting/reviewing separately obtained history, performing a medically appropriate exam/evaluation, counseling and educating, and documenting clinical information in the EHR.   Return in about 3 months (around 11/01/2023) for Chronic Followup.   Liddie Chichester T Jo Booze, PA-C

## 2023-08-01 NOTE — Assessment & Plan Note (Signed)
 Chronic, suspected stable, did not have doppler on hand today.  - Continue current regimen. - Stable at last VAD visit.

## 2023-08-02 ENCOUNTER — Telehealth: Payer: Self-pay | Admitting: Internal Medicine

## 2023-08-02 ENCOUNTER — Encounter (HOSPITAL_COMMUNITY): Payer: Self-pay | Admitting: Internal Medicine

## 2023-08-02 ENCOUNTER — Encounter (HOSPITAL_BASED_OUTPATIENT_CLINIC_OR_DEPARTMENT_OTHER): Payer: Self-pay

## 2023-08-02 ENCOUNTER — Ambulatory Visit (HOSPITAL_COMMUNITY)
Admission: RE | Admit: 2023-08-02 | Discharge: 2023-08-02 | Disposition: A | Discharge status: Home / Self Care | Source: Ambulatory Visit | Attending: Internal Medicine | Admitting: Internal Medicine

## 2023-08-02 VITALS — BP 110/0 | HR 70 | Wt 178.0 lb

## 2023-08-02 DIAGNOSIS — Z95811 Presence of heart assist device: Secondary | ICD-10-CM | POA: Diagnosis not present

## 2023-08-02 DIAGNOSIS — I50810 Right heart failure, unspecified: Secondary | ICD-10-CM | POA: Diagnosis not present

## 2023-08-02 DIAGNOSIS — I2510 Atherosclerotic heart disease of native coronary artery without angina pectoris: Secondary | ICD-10-CM | POA: Diagnosis not present

## 2023-08-02 DIAGNOSIS — D62 Acute posthemorrhagic anemia: Secondary | ICD-10-CM | POA: Diagnosis not present

## 2023-08-02 DIAGNOSIS — I480 Paroxysmal atrial fibrillation: Secondary | ICD-10-CM

## 2023-08-02 DIAGNOSIS — I5022 Chronic systolic (congestive) heart failure: Secondary | ICD-10-CM

## 2023-08-02 DIAGNOSIS — Z4509 Encounter for adjustment and management of other cardiac device: Secondary | ICD-10-CM | POA: Insufficient documentation

## 2023-08-02 DIAGNOSIS — D509 Iron deficiency anemia, unspecified: Secondary | ICD-10-CM

## 2023-08-02 DIAGNOSIS — I472 Ventricular tachycardia, unspecified: Secondary | ICD-10-CM | POA: Diagnosis not present

## 2023-08-02 DIAGNOSIS — I5023 Acute on chronic systolic (congestive) heart failure: Secondary | ICD-10-CM

## 2023-08-02 DIAGNOSIS — Z7901 Long term (current) use of anticoagulants: Secondary | ICD-10-CM

## 2023-08-02 LAB — COMPREHENSIVE METABOLIC PANEL WITH GFR
ALT: 20 U/L (ref 0–44)
AST: 29 U/L (ref 15–41)
Albumin: 4.1 g/dL (ref 3.5–5.0)
Alkaline Phosphatase: 78 U/L (ref 38–126)
Anion gap: 10 (ref 5–15)
BUN: 13 mg/dL (ref 6–20)
CO2: 26 mmol/L (ref 22–32)
Calcium: 9 mg/dL (ref 8.9–10.3)
Chloride: 104 mmol/L (ref 98–111)
Creatinine, Ser: 1.19 mg/dL (ref 0.61–1.24)
GFR, Estimated: >60 mL/min (ref >60)
Glucose, Bld: 81 mg/dL (ref 70–99)
Potassium: 4.3 mmol/L (ref 3.5–5.1)
Sodium: 140 mmol/L (ref 135–145)
Total Bilirubin: 1.9 mg/dL — ABNORMAL HIGH (ref 0.0–1.2)
Total Protein: 7 g/dL (ref 6.5–8.1)

## 2023-08-02 LAB — CBC
HCT: 46.8 % (ref 39.0–52.0)
Hemoglobin: 14.7 g/dL (ref 13.0–17.0)
MCH: 28.2 pg (ref 26.0–34.0)
MCHC: 31.4 g/dL (ref 30.0–36.0)
MCV: 89.8 fL (ref 80.0–100.0)
Platelets: 178 K/uL (ref 150–400)
RBC: 5.21 MIL/uL (ref 4.22–5.81)
RDW: 13.7 % (ref 11.5–15.5)
WBC: 5.5 K/uL (ref 4.0–10.5)
nRBC: 0 % (ref 0.0–0.2)

## 2023-08-02 LAB — IRON AND TIBC
Iron: 69 ug/dL (ref 45–182)
Saturation Ratios: 15 % — ABNORMAL LOW (ref 17.9–39.5)
TIBC: 449 ug/dL (ref 250–450)
UIBC: 380 ug/dL

## 2023-08-02 LAB — MAGNESIUM: Magnesium: 2 mg/dL (ref 1.7–2.4)

## 2023-08-02 LAB — FOLATE: Folate: 17.9 ng/mL (ref >5.9)

## 2023-08-02 LAB — PREALBUMIN: Prealbumin: 25 mg/dL (ref 18–38)

## 2023-08-02 LAB — PROTIME-INR
INR: 1.5 — ABNORMAL HIGH (ref 0.8–1.2)
Prothrombin Time: 19.2 s — ABNORMAL HIGH (ref 11.4–15.2)

## 2023-08-02 LAB — LACTATE DEHYDROGENASE: LDH: 204 U/L — ABNORMAL HIGH (ref 98–192)

## 2023-08-02 LAB — VITAMIN B12: Vitamin B-12: 369 pg/mL (ref 180–914)

## 2023-08-02 LAB — FERRITIN: Ferritin: 20 ng/mL — ABNORMAL LOW (ref 24–336)

## 2023-08-02 NOTE — Progress Notes (Signed)
 Pt presents for 2 month f/u  with 1 year Intermacs and Annual Maintenance in VAD Clinic today alone. Reports no problems with VAD equipment or concerns with drive line.   Pt complains of increased shortness of breath and generalized fatigue since last visit. He reports occasional dizziness if he gets up too quickly. Denies signs of bleeding. Pt states he took Lasix  3-4 ytimes last week and once so far this week. Unsure of Lasix  dose he states it is an old prescription. Dr. Cherrie felt pt was euvolemic today. Pt advised to call VAD Clinic to confirm dose of Lasix  and not take more than 2 doses per week if he feels it is needed per Dr. Bensimhon. VAD Flow trending lower than past Speed increased to 5500 with plans for a ramp echo in 2 weeks.   Pt reports not taking his inhalers prescribed for COPD (Advair  HFA) or prescription drops for glaucoma. Pt has not seen Pulmonology since 2022. Dr. Bensimhon advised for pt to take inhalers as prescribed.  Patient remains on chronic suppressive Amoxicillin  1000 mg BID for previous Enterococcus Faecalis bacteremia.   S/p cystoscopy/uteroscopy on 4/25. Stent removed by pt 05/23/23 per urology instructions. Biopsies from procedure were inconclusive. No longer experiencing hematuria.   Vital Signs:  Doppler Pressure: 110 Automatic BP: 101/81 (90) HR: 70 SR SPO2: 93% RA   Weight: 178 lb w/ eqt Discharge weight: 174.3 lb w/ eqt   VAD Indication: Destination Therapy - sarcoid   LVAD assessment: HM III  VAD Speed: 5400 rpms>>5500 Flow: 3.7>>4.2 Power: 3.9 w  >>4.3 w PI: 5.7 >> 4.1  Alarms: none Events: 0-30 daily   Fixed speed: 5400>>5500 Low speed limit: 5100>>5200  Primary controller: back up battery due for replacement in 20 months Secondary controller: back up battery due for replacement in 23 months   I reviewed the LVAD parameters from today and compared the results to the patient's prior recorded data. LVAD interrogation was NEGATIVE for  significant power changes, NEGATIVE for clinical alarms and STABLE for PI events/speed drops. No programming changes were made and pump is functioning within specified parameters. Pt is performing daily controller and system monitor self tests along with completing weekly and monthly maintenance for LVAD equipment.   LVAD equipment check completed and is in good working order. Back-up equipment not present at today's visit.   Annual Equipment Maintenance on UBC/PM was performed on 08/02/22.  Annual maintenance completed per [employer] on patient's home power module and Warehouse manager.    Backup system controller 11 volt battery charged during visit.   1 year Intermacs follow up completed including: Quality of Life, KCCQ-12, and Neurocognitive trail making.   Pt completed 1430 feet during 6 minute walk.  [place] Cardiomyopathy Questionnaire     08/02/2023   12:58 PM 03/23/2023    2:35 PM 12/11/2022   12:17 PM  KCCQ-12  1 a. Ability to shower/bathe Slightly limited Not at all limited Not at all limited  1 b. Ability to walk 1 block Moderately limited Not at all limited Not at all limited  1 c. Ability to hurry/jog Extremely limited Quite a bit limited Quite a bit limited  2. Edema feet/ankles/legs 1-2 times a week Less than once a week Less than once a week  3. Limited by fatigue Several times a day 1-2 times a week 1-2 times a week  4. Limited by dyspnea Several times a day Less than once a week Less than once a week  5.  Sitting up / on 3+ pillows Never over the past 2 weeks Never over the past 2 weeks Never over the past 2 weeks  6. Limited enjoyment of life Moderately limited Slightly limited Slightly limited  7. Rest of life w/ symptoms Somewhat satisfied Mostly dissatisfied Mostly dissatisfied  8 a. Participation in hobbies Moderately limited Slightly limited Slightly limited  8 b. Participation in chores Severely limited Slightly limited Slightly limited  8 c. Visiting  family/friends Moderately limited N/A, did not do for other reasons N/A, did not do for other reasons      Patient Goals: To feel better  Exit Site Care:  Existing VAD dressing clean, dry, and intact. Anchor correctly applied. Mild irritation at anchor site. Pt did not have an anchor on today. Cleansed with VASHE and tegaderm placed prior to anchor.  Provided with 8 weekly kits for home use.    Significant Events on VAD Support:  08/30/22: Acute infarct seen on the right temporal cortex and basal ganglia    Device: Medtronic Therapies: ON Last check: 06/13/22   BP & Labs:  MAP 88 - Doppler is reflecting modified systolic   Hgb 14.7 - No S/S of bleeding. Specifically denies melena/BRBPR or nosebleeds.   LDH 204 with established baseline of 180- 245. Denies tea-colored urine. No power elevations noted on interrogation.    Plan:  No medication changes You can take Lasix  as needed; please call VAD Clinic and confirm dose at home Please take your inhalers as prescribed Return to VAD Clinic in 2 weeks for follow up with Dr. Cherrie and ramp echo Coumadin  dosing per Tinnie

## 2023-08-02 NOTE — Patient Instructions (Addendum)
 No medication changes You can take Lasix  as needed; please call VAD Clinic and confirm dose at home Please take your inhalers as prescribed Return to VAD Clinic in 2 weeks for follow up with Dr. Cherrie and ramp echo Coumadin  dosing per Tinnie

## 2023-08-03 ENCOUNTER — Telehealth: Payer: Self-pay

## 2023-08-03 ENCOUNTER — Telehealth (HOSPITAL_COMMUNITY): Payer: Self-pay | Admitting: Internal Medicine

## 2023-08-03 NOTE — Telephone Encounter (Signed)
 Auth Submission: NO AUTH NEEDED Site of care: Site of care: [facility] Payer: [employer] Medication & CPT/J Code(s) submitted: Feraheme (ferumoxytol ) [id number] Diagnosis Code:  Route of submission (phone, fax, portal):  Phone # Fax # Auth type: Buy/Bill PB Units/visits requested: 510mg  x 2 doses Reference number:  Approval from: 08/03/23 to 12/04/23

## 2023-08-03 NOTE — Telephone Encounter (Signed)
 Patient referred to infusion pharmacy team for ambulatory infusion of IV iron.  Insurance - TEFL teacher of care - Site of care: [facility] Dx code - D50.9 IV Iron Therapy - Feraheme 510 mg x2 Infusion appointments - Scheduling team will schedule patient as soon as possible.    Norton Blush, PharmD Pharmacist [facility]

## 2023-08-05 ENCOUNTER — Ambulatory Visit (HOSPITAL_COMMUNITY): Payer: Self-pay | Admitting: Internal Medicine

## 2023-08-06 ENCOUNTER — Other Ambulatory Visit (HOSPITAL_COMMUNITY): Payer: Self-pay | Admitting: Unknown Physician Specialty

## 2023-08-06 ENCOUNTER — Telehealth (HOSPITAL_COMMUNITY): Payer: Self-pay | Admitting: Pharmacy Technician

## 2023-08-06 DIAGNOSIS — D509 Iron deficiency anemia, unspecified: Secondary | ICD-10-CM

## 2023-08-06 NOTE — Addendum Note (Signed)
 Addended by: VICCI SELLER A on: 08/06/2023 01:50 PM   Modules accepted: Orders

## 2023-08-06 NOTE — Progress Notes (Signed)
 Remote ICD transmission.

## 2023-08-07 ENCOUNTER — Telehealth (HOSPITAL_COMMUNITY): Payer: Self-pay | Admitting: *Deleted

## 2023-08-07 ENCOUNTER — Ambulatory Visit

## 2023-08-07 ENCOUNTER — Other Ambulatory Visit (HOSPITAL_COMMUNITY): Payer: Self-pay | Admitting: *Deleted

## 2023-08-07 ENCOUNTER — Encounter (HOSPITAL_COMMUNITY)

## 2023-08-07 VITALS — HR 73 | Temp 97.9°F | Resp 20 | Ht 66.0 in | Wt 183.0 lb

## 2023-08-07 DIAGNOSIS — D509 Iron deficiency anemia, unspecified: Secondary | ICD-10-CM

## 2023-08-07 MED ORDER — SODIUM CHLORIDE 0.9 % IV SOLN
510.0000 mg | Freq: Once | INTRAVENOUS | Status: AC
  Administered 2023-08-07: 510 mg via INTRAVENOUS
  Filled 2023-08-07: qty 17

## 2023-08-07 NOTE — Telephone Encounter (Addendum)
 Received call from pt stating he needed to schedule a dressing appt today because he pulled his entire dressing off last night because he was itchy. Reports rash at exit site. Exit site has been open to air since pt removed dressing. Sandrea is unavailable at this time to perform wound care at home. Advised for him to come to clinic now for dressing change due to high infection risk with exit site uncovered. He does not have any dressing supplies at his house. Pt states he is unable to come to clinic now because he is coming for support group and and Fereheme infusion this afternoon, and does not want to drive to [place] twice. He understands he is at high risk for trauma and infection without dressing and anchor in place. Appt scheduled at 12:30 per pt request.  Isaiah Knoll RN VAD Coordinator  Office: [phone]  24/7 Pager: [phone]

## 2023-08-07 NOTE — Telephone Encounter (Signed)
 Pt called VAD Clinic to cancel dressing change appointment as his caregiver is able to perform dressing change at home.  Schuyler Lunger RN, BSN VAD Coordinator 24/7 Pager [phone]

## 2023-08-07 NOTE — Progress Notes (Signed)
 Diagnosis: Iron Deficiency Anemia  Provider:  Praveen Mannam MD  Procedure: IV Infusion  IV Type: Peripheral, IV Location: L Antecubital  Feraheme (Ferumoxytol ), Dose: 510 mg  Infusion Start Time: 1519  Infusion Stop Time: 1540  Post Infusion IV Care: Patient declined observation and Peripheral IV Discontinued  Discharge: Condition: Good, Destination: Home . AVS Declined  Performed by:  Leita FORBES Miles, LPN

## 2023-08-08 ENCOUNTER — Telehealth (HOSPITAL_COMMUNITY): Payer: Self-pay

## 2023-08-08 ENCOUNTER — Encounter: Payer: Self-pay | Admitting: Internal Medicine

## 2023-08-08 ENCOUNTER — Other Ambulatory Visit (HOSPITAL_COMMUNITY): Payer: Self-pay | Admitting: Pharmacist

## 2023-08-08 ENCOUNTER — Other Ambulatory Visit: Payer: Self-pay

## 2023-08-08 ENCOUNTER — Other Ambulatory Visit (HOSPITAL_COMMUNITY): Payer: Self-pay

## 2023-08-08 ENCOUNTER — Ambulatory Visit (HOSPITAL_COMMUNITY): Payer: Self-pay | Admitting: Pharmacist

## 2023-08-08 DIAGNOSIS — Z95811 Presence of heart assist device: Secondary | ICD-10-CM

## 2023-08-08 DIAGNOSIS — I5022 Chronic systolic (congestive) heart failure: Secondary | ICD-10-CM

## 2023-08-08 DIAGNOSIS — I50810 Right heart failure, unspecified: Secondary | ICD-10-CM

## 2023-08-08 LAB — POCT INR: INR: 1.4 — AB (ref 2.0–3.0)

## 2023-08-08 MED ORDER — SILDENAFIL CITRATE 20 MG PO TABS
20.0000 mg | ORAL_TABLET | Freq: Three times a day (TID) | ORAL | 11 refills | Status: DC
  Filled 2023-08-08 – 2023-08-16 (×2): qty 90, 30d supply, fill #0
  Filled 2023-10-18: qty 90, 30d supply, fill #1

## 2023-08-08 NOTE — Telephone Encounter (Signed)
 Advanced Heart Failure Patient Advocate Encounter  Patients insurance does not cover Sildenafil . Test billing shows that this medication with [facility] pharmacy would cost $15.69 for 30 day supply. Patient is currently paying apx $30 for 30 days.  Spoke to patient by phone, pt is agreeable to having [facility] pharmacy fill and deliver his medication. New rx has been sent in to [facility] pharmacy and is being filled, patient preference has been change to delivery.  Rachel DEL, CPhT Rx Patient Advocate Phone: [phone]

## 2023-08-12 NOTE — Progress Notes (Signed)
 LVAD CLINIC NOTE  PCP: Rothfuss, Lang DASEN, PA-C Cardiologist: Redell Leiter, MD HF MD:    HPI:  Charles Holmes is a 58 y.o. 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 Underwent HM- 3 LVAD implant on 08/13 + clipping left atrial appendage    Admitted to [facility] 11/21 with CP. Found to have VT and EF 25-30% mild AI, Charles and TR. Transferred to [facility]. Cath showed 70% prox LAD with no intervention.    S/P CRT-D upgrade 06/08/21.   6/24, admitted w/ acute CVA due to right M1 occlusion treated w/ mechanical thrombectomy.   Echo showed LV markedly dilated EF < 20% and small effusion. RV ok. CVA felt to be cardioembolic.   Admitted in 7/24 for low-output HF. Angiography showed mild nonobstructive CAD. RHC hemodynamics c/w severe NICM w/ severely elevated filling pressures and low output state c/w cardiogenic shock. Repeat PFTs w/ severe obstructive defect (FEV1 1.04L, FEV1/FVC 48%) +response to bronchodilator.   Underwent LVAD implant on 08/13 + clipping left atrial appendage d/t severe thickening and invagination of mitral valve annulus impeding flows. Apical core sent to pathology - no mention of sarcoid.   Post VAD implant c/b left-sided hemiplegia. CT head 8/14 with acute R MCA infarct. Taken to IR for thrombectomy. F/u head CT with small hemorrhagic conversion. Had some residual deficits w/ mild left sided weakness but otherwise recovered. Course further c/b enterococcus faecalis bacteremia w/ 2/2 + cultures on 9/6. ID consulted. Recommended ampicillin  and ceftriaxone . PICC removed. Now on suppressive amoxicillin   Underwent cystoscopy/uretoscopy with ureteral stent placement on 04/16/23 for gross hematuria . R ureter and kidney not well visualized due to bleeding. S/p repeat cystoscopy/uteroscopy on 4/25. Stent removed by pt 05/23/23 per urology instructions. Recently had follow up with Dr Elisabeth. Biopsies from procedure were inconclusive.  Here for  f/u.Complains of intermittent fatigue and DOE. Has been taking lasix  more frequently lately (3-4x/week) Denies orthopnea or PND. No fevers, chills or problems with driveline. No bleeding, melena or neuro symptoms. No VAD alarms. Taking all meds as prescribed.    VAD Indication: Destination Therapy - sarcoid   LVAD assessment: HM III   VAD Speed: 5400 rpms>>5500 Flow: 3.7>>4.2 Power: 3.9 w  >>4.3 w PI: 5.7 >> 4.1   Alarms: none Events: 0-30 daily   Fixed speed: 5400>>5500 Low speed limit: 5100>>5200   Primary controller: back up battery due for replacement in 20 months Secondary controller: back up battery due for replacement in 23 months   I reviewed the LVAD parameters from today and compared the results to the patient's prior recorded data. LVAD interrogation was NEGATIVE for significant power changes, NEGATIVE for clinical alarms and STABLE for PI events/speed drops. No programming changes were made and pump is functioning within specified parameters. Pt is performing daily controller and system monitor self tests along with completing weekly and monthly maintenance for LVAD equipment.   LVAD equipment check completed and is in good working order. Back-up equipment not present at today's visit.   Annual Equipment Maintenance on UBC/PM was performed on 08/02/22.   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   Stroke [facility])    Systolic heart  failure (HCC) 7978   LVEF 18%, RVEF 38% on cardiac MRI 12/19/2019. possible cardiac sarcoidosis.   Wide-complex tachycardia 2021   wears LifeVest    Current Outpatient Medications  Medication Sig Dispense Refill   acetaminophen  (TYLENOL ) 325 MG tablet Take 650 mg by mouth as needed (2-3 TABS AS NEEDED).     amoxicillin  (AMOXIL ) 500 MG tablet Take 2 tablets (1,000 mg total) by mouth 2 (two) times daily. 360 tablet 3   aspirin  EC 81 MG tablet Take 1 tablet (81 mg total) by mouth daily. Swallow whole. 120 tablet 0   atorvastatin  (LIPITOR ) 80 MG tablet Take 1 tablet (80 mg total) by mouth daily. 90 tablet 3   dorzolamide -timolol  (COSOPT ) 2-0.5 % ophthalmic solution Place 1 drop into both eyes 2 (two) times daily. (Patient taking differently: Place 1 drop into both eyes 2 (two) times daily.)     fluticasone-salmeterol (ADVAIR  HFA) 230-21 MCG/ACT inhaler Inhale 2 puffs into the lungs 2 (two) times daily. 1 each 12   melatonin 5 MG TABS Take 5 mg by mouth at bedtime.     mexiletine (MEXITIL ) 250 MG capsule TAKE 1 CAPSULE BY MOUTH 2 TIMES DAILY. 180 capsule 3   nystatin  cream (MYCOSTATIN ) Apply 1 Application topically 2 (two) times daily as needed (itching). 30 g 3   pantoprazole  (PROTONIX ) 40 MG tablet Take 1 tablet (40 mg total) by mouth daily before breakfast. 30 tablet 0   sertraline  (ZOLOFT ) 50 MG tablet Take 1 tablet (50 mg total) by mouth daily. 90 tablet 3   traZODone  (DESYREL ) 100 MG tablet Take 100 mg by mouth at bedtime as needed.     warfarin (COUMADIN ) 3 MG tablet Take 3 mg (1 tablet) daily or as directed by the advanced heart failure clinic 180 tablet 3   latanoprost  (XALATAN ) 0.005 % ophthalmic solution Place 1 drop into both eyes at bedtime. (Patient not taking: Reported on 08/02/2023)     sildenafil  (REVATIO ) 20 MG tablet Take 1 tablet (20 mg total) by mouth 3 (three) times daily. 90 tablet 11   No current facility-administered medications for this encounter.    Pacerone   [amiodarone ] and Oxycodone     Vital Signs: Doppler Pressure: 110 Automatic BP: 101/81 (90) HR: 70 SR SPO2: 93% RA   Weight: 178 lb w/ eqt Discharge weight: 174.3 lb w/ eqt  Physical Exam: General:  NAD.  HEENT: normal  Neck: supple. JVP not elevated.  Carotids 2+ bilat; no bruits. No lymphadenopathy or thryomegaly appreciated. Cor: LVAD hum.  Lungs: Clear. Abdomen: soft, nontender, non-distended. No hepatosplenomegaly. No bruits or masses. Good bowel sounds. Driveline site clean. Anchor in place.  Extremities: no cyanosis, clubbing, rash. Warm no edema  Neuro: alert & oriented x 3. No focal deficits. Moves all 4 without problem     ASSESSMENT AND PLAN:   1.  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 Presented with VT. LHC 70% LAD  - cMRI 12/21 concerning for sarcoid and EF 18%.  - PET 2/22 at [facility] EF 25% + active sarcoid - Echo 08/26/20 EF < 20% severely dilated LV RV mildly decreased.  - Medtronic CRT-D upgrade in 06/08/21 - Echo 07/10/22: EF <20%,  RV okay, mod pericardial effusion, mod Charles/TR - 08/29/22 HM III LVAD implant + clipping LAA d/t severe thickening and invagination of mitral valve annulus impeding flows. Core biopsy not c/w sarcoid  - Having more SOB and fatigue lately. NYHA II-III. Also taking more lasix  - Volume looks ok on exam. Continue spiro. Encouraged him to not overdo lasix  - I suspect some of this is just deconditioning - Encouraged him to be more active. - VAD speed increased.  - Ramp cho 2 weeks   2. HM-3 LVAD - VAD interrogated personally. Parameters stable. Speed increased today - LDH 204 - DL site ok (on suppressive amox) - MAPs ok - Hgb improved 13.0 -> 14.7 but remains iron deficient - INR 1.5. Goal 1.5-2.0 for now with recent hematuria,.  Remains on ASA 81 mg daily (on for CVA). Discussed warfarin dosing with PharmD personally.  3. Gross hematuria - being followed by Urology - Underwent cystoscopy/uretoscopy with  ureteral stent placement on 04/16/23 . R ureter and kidney not well visualized due to bleeding.  - Had repeat cystoscopy/uteroscopy on 4/25. Stent now out - Hematuria resolved. Path inconclusive to date - No change. No further hematuria   4.  H/o stroke - Admitted 06/24 w/ R MCA stroke. S/p TPA and mechanical clot extraction. No residual deficits. Likely cardioembolic in setting of severe LV dysfunction. - Developed left sided weakness 08/14. CTA with R MCA infarct. Taken to IR for thrombectomy - Repeat CT head with small to moderate size hemorrhagic conversion.  - Recovered well with just mild LUE weakness. - Doing well  5. Enterococcus faecalis bacteremia - Bcx 2/2 on 9/6 - ID consult 9/6 -> ampicillin  and ceftriaxone  - Echo 09/10 - no obvious vegetations - Completed 6 weeks of IV Ampicillin  and Ceftriaxone . Now on chronic suppression with Amoxicillin . - DL site looks good. Continue suppressive abx.    6. Hx VT - ln setting of potential sarcoid heart disease though core biopsy negative at time of VAD placement - Off amio due to tremor. Continue mexiletine  -No recent VT   7. CAD - LHC 12/07/19 70-% LAD, no intervention - LHC 8/24 non obstructive CAD.  - Continue statin. On aspirin  for VAD. - No s/s angina   8. 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  - holding methotrexate  w/ recent surgery and active infection, can discuss timing of restarting the medication at outpatient follow-up - apical core pathology not diagnostic of cardiac sarcoidosis.  - PET negative for sarcoid, Will not restart methotrexate  - No change   9. Paroxsymal AT/AF - Remains in NSR   10. Pulmonary  - PFTs with severe obstructive defect, response to bronchodilator. FEV1 1.04L, FEV1/FVC 48% - Encouraged Pulmonary f/u with increased SOB  11. Iron deficiency - arrange iron infusion  I spent a total of 43 minutes today: 1) reviewing the patient's  medical records including previous charts, labs and recent notes from other providers; 2) examining the patient and counseling them on their medical issues/explaining the plan of care; 3) adjusting meds as needed and 4) ordering lab work or other needed tests.     Toribio Fuel, MD  9:16 PM

## 2023-08-14 ENCOUNTER — Ambulatory Visit (INDEPENDENT_AMBULATORY_CARE_PROVIDER_SITE_OTHER)

## 2023-08-14 VITALS — HR 71 | Temp 98.2°F | Resp 18 | Ht 66.0 in | Wt 177.2 lb

## 2023-08-14 DIAGNOSIS — D509 Iron deficiency anemia, unspecified: Secondary | ICD-10-CM | POA: Diagnosis not present

## 2023-08-14 MED ORDER — SODIUM CHLORIDE 0.9 % IV SOLN
510.0000 mg | Freq: Once | INTRAVENOUS | Status: AC
  Administered 2023-08-14: 510 mg via INTRAVENOUS
  Filled 2023-08-14: qty 17

## 2023-08-14 NOTE — Progress Notes (Signed)
 Diagnosis: Iron Deficiency Anemia  Provider:  Praveen Mannam MD  Procedure: IV Infusion  IV Type: Peripheral, IV Location: R Forearm  Feraheme (Ferumoxytol ), Dose: 510 mg  Infusion Start Time: 1436  Infusion Stop Time: 1458  Post Infusion IV Care: Patient declined observation and Peripheral IV Discontinued  Discharge: Condition: Good, Destination: Home . AVS Declined  Performed by:  Leonore Frankson, RN

## 2023-08-15 ENCOUNTER — Ambulatory Visit (HOSPITAL_COMMUNITY): Payer: Self-pay | Admitting: Pharmacist

## 2023-08-15 ENCOUNTER — Other Ambulatory Visit (HOSPITAL_COMMUNITY): Payer: Self-pay

## 2023-08-15 ENCOUNTER — Other Ambulatory Visit (HOSPITAL_COMMUNITY): Payer: Self-pay | Admitting: *Deleted

## 2023-08-15 ENCOUNTER — Telehealth (HOSPITAL_COMMUNITY): Payer: Self-pay

## 2023-08-15 ENCOUNTER — Other Ambulatory Visit: Payer: Self-pay

## 2023-08-15 DIAGNOSIS — Z95811 Presence of heart assist device: Secondary | ICD-10-CM

## 2023-08-15 DIAGNOSIS — I5022 Chronic systolic (congestive) heart failure: Secondary | ICD-10-CM

## 2023-08-15 DIAGNOSIS — Z7901 Long term (current) use of anticoagulants: Secondary | ICD-10-CM

## 2023-08-15 LAB — POCT INR: INR: 1.5 — AB (ref 2.0–3.0)

## 2023-08-15 NOTE — Telephone Encounter (Signed)
 Advanced Heart Failure Patient Advocate Encounter  Patient mentioned issues with delivery to pharmacist, [facility] shows delivery to home address on 07/25 around 11 am. Patient states medication was not received. I have contacted the pharmacy to review this delivery and pharmacy staff is reaching out to courier for response.   Patient will be on site with [facility] tomorrow, I am working to see if SW may be able to assist with this medication cost, or if [facility] will allow a reissue of this medication, depending on outcome of conversation with courier. Will update once more information is known.  Rachel DEL, CPhT Rx Patient Advocate Phone: [phone]

## 2023-08-16 ENCOUNTER — Other Ambulatory Visit (HOSPITAL_COMMUNITY): Payer: Self-pay

## 2023-08-16 ENCOUNTER — Ambulatory Visit (HOSPITAL_BASED_OUTPATIENT_CLINIC_OR_DEPARTMENT_OTHER)
Admission: RE | Admit: 2023-08-16 | Discharge: 2023-08-16 | Disposition: A | Discharge status: Home / Self Care | Source: Ambulatory Visit | Attending: Internal Medicine | Admitting: Internal Medicine

## 2023-08-16 ENCOUNTER — Ambulatory Visit (HOSPITAL_COMMUNITY)
Admission: RE | Admit: 2023-08-16 | Discharge: 2023-08-16 | Disposition: A | Discharge status: Home / Self Care | Source: Ambulatory Visit | Attending: Internal Medicine | Admitting: Internal Medicine

## 2023-08-16 VITALS — BP 108/0 | HR 73 | Wt 180.2 lb

## 2023-08-16 DIAGNOSIS — I3139 Other pericardial effusion (noninflammatory): Secondary | ICD-10-CM | POA: Insufficient documentation

## 2023-08-16 DIAGNOSIS — Z79899 Other long term (current) drug therapy: Secondary | ICD-10-CM | POA: Insufficient documentation

## 2023-08-16 DIAGNOSIS — I110 Hypertensive heart disease with heart failure: Secondary | ICD-10-CM | POA: Diagnosis not present

## 2023-08-16 DIAGNOSIS — I5022 Chronic systolic (congestive) heart failure: Secondary | ICD-10-CM | POA: Diagnosis not present

## 2023-08-16 DIAGNOSIS — I480 Paroxysmal atrial fibrillation: Secondary | ICD-10-CM | POA: Insufficient documentation

## 2023-08-16 DIAGNOSIS — I5020 Unspecified systolic (congestive) heart failure: Secondary | ICD-10-CM | POA: Insufficient documentation

## 2023-08-16 DIAGNOSIS — I2510 Atherosclerotic heart disease of native coronary artery without angina pectoris: Secondary | ICD-10-CM | POA: Diagnosis not present

## 2023-08-16 DIAGNOSIS — G8194 Hemiplegia, unspecified affecting left nondominant side: Secondary | ICD-10-CM | POA: Diagnosis not present

## 2023-08-16 DIAGNOSIS — K219 Gastro-esophageal reflux disease without esophagitis: Secondary | ICD-10-CM | POA: Insufficient documentation

## 2023-08-16 DIAGNOSIS — R5383 Other fatigue: Secondary | ICD-10-CM | POA: Insufficient documentation

## 2023-08-16 DIAGNOSIS — B952 Enterococcus as the cause of diseases classified elsewhere: Secondary | ICD-10-CM | POA: Diagnosis not present

## 2023-08-16 DIAGNOSIS — Z7901 Long term (current) use of anticoagulants: Secondary | ICD-10-CM | POA: Insufficient documentation

## 2023-08-16 DIAGNOSIS — Z7982 Long term (current) use of aspirin: Secondary | ICD-10-CM | POA: Diagnosis not present

## 2023-08-16 DIAGNOSIS — I5023 Acute on chronic systolic (congestive) heart failure: Secondary | ICD-10-CM | POA: Diagnosis not present

## 2023-08-16 DIAGNOSIS — Z95811 Presence of heart assist device: Secondary | ICD-10-CM | POA: Diagnosis not present

## 2023-08-16 DIAGNOSIS — Z8673 Personal history of transient ischemic attack (TIA), and cerebral infarction without residual deficits: Secondary | ICD-10-CM | POA: Diagnosis not present

## 2023-08-16 LAB — CBC
HCT: 50.2 % (ref 39.0–52.0)
Hemoglobin: 15.8 g/dL (ref 13.0–17.0)
MCH: 28.1 pg (ref 26.0–34.0)
MCHC: 31.5 g/dL (ref 30.0–36.0)
MCV: 89.2 fL (ref 80.0–100.0)
Platelets: 181 K/uL (ref 150–400)
RBC: 5.63 MIL/uL (ref 4.22–5.81)
RDW: 14.7 % (ref 11.5–15.5)
WBC: 5.9 K/uL (ref 4.0–10.5)
nRBC: 0 % (ref 0.0–0.2)

## 2023-08-16 LAB — BASIC METABOLIC PANEL WITH GFR
Anion gap: 7 (ref 5–15)
BUN: 11 mg/dL (ref 6–20)
CO2: 27 mmol/L (ref 22–32)
Calcium: 9.2 mg/dL (ref 8.9–10.3)
Chloride: 107 mmol/L (ref 98–111)
Creatinine, Ser: 1.19 mg/dL (ref 0.61–1.24)
GFR, Estimated: >60 mL/min (ref >60)
Glucose, Bld: 77 mg/dL (ref 70–99)
Potassium: 4 mmol/L (ref 3.5–5.1)
Sodium: 141 mmol/L (ref 135–145)

## 2023-08-16 LAB — PROTIME-INR
INR: 1.6 — ABNORMAL HIGH (ref 0.8–1.2)
Prothrombin Time: 19.7 s — ABNORMAL HIGH (ref 11.4–15.2)

## 2023-08-16 LAB — LACTATE DEHYDROGENASE: LDH: 209 U/L — ABNORMAL HIGH (ref 98–192)

## 2023-08-16 LAB — MAGNESIUM: Magnesium: 2 mg/dL (ref 1.7–2.4)

## 2023-08-16 NOTE — Progress Notes (Signed)
Pt presents for 2 week f/u  with Ramp Echo in VAD Clinic today alone. Reports no problems with VAD equipment or concerns with drive line.   Pt had complaints of increased shortness of breath and generalized fatigue last visit. Speed increased to 5500. Flow trending lower than baseline. Pt reports mild improvement in shortness of breath and fatigue since last visit. He reports being out of Sildenafil  for 2 weeks due to complications with delivery. Medication refilled at [facility] and given to pt today's appointment. He reports occasional dizziness if he gets up too quickly. Denies signs of bleeding. Pt states he took PRN Lasix  (40mg ) once so far this week.   Pt reports better compliance with inhalers prescribed for COPD (Advair  HFA) and prescription drops for glaucoma.   Patient remains on chronic suppressive Amoxicillin  1000 mg BID for previous Enterococcus Faecalis bacteremia.   S/p cystoscopy/uteroscopy on 4/25. Stent removed by pt 05/23/23 per urology instructions. Biopsies from procedure were inconclusive. No longer experiencing hematuria.   Ramp echo complete at today's visit. See separate note for details. Speed increased to 5700. Per Dr.Bensimhon will obtain CT Angio Chest to rule out EOGO. CT scan scheduled for 8/14.  Referral placed to cardiac rehab. Pt previously referred and did not attend due to issue with insurance coverage. Pt states since then he has new insurance. VAD Coordinator discussed with RN in [facility] personally.  Vital Signs:  Doppler Pressure: 108 Automatic BP: 92/78 (84) HR: 73 SR SPO2: 94% RA   Weight: 180.2 lb w/ eqt Discharge weight: 178 lb w/ eqt   VAD Indication: Destination Therapy - sarcoid   LVAD assessment: HM III VAD Speed: 5500>5700 Flow: 3.7>3.6 Power: 4.2 w>4.2W PI: 6.3>4.7  Alarms: none Events: 0-30 daily   Fixed speed: 5700 Low speed limit: 5400   Primary controller: back up battery due for replacement in 20  months Secondary controller: back up battery due for replacement in 23 months   I reviewed the LVAD parameters from today and compared the results to the patient's prior recorded data. LVAD interrogation was NEGATIVE for significant power changes, NEGATIVE for clinical alarms and STABLE for PI events/speed drops. No programming changes were made and pump is functioning within specified parameters. Pt is performing daily controller and system monitor self tests along with completing weekly and monthly maintenance for LVAD equipment.   LVAD equipment check completed and is in good working order. Back-up equipment not present at today's visit.   Annual Equipment Maintenance on UBC/PM was performed on 08/02/23.  Exit Site Care:  Existing VAD dressing clean, dry, and intact. Anchor correctly applied. Improvement in skin irrigation related to adhesive from anchor. Pt has adequate supplies at home for weekly dressing changes.   Significant Events on VAD Support:  08/30/22: Acute infarct seen on the right temporal cortex and basal ganglia    Device: Medtronic Therapies: ON Last check: 06/13/22   BP & Labs:  MAP 108 - Doppler is reflecting modified systolic   Hgb 15.8 - No S/S of bleeding. Specifically denies melena/BRBPR or nosebleeds.   LDH 209 with established baseline of 180- 245. Denies tea-colored urine. No power elevations noted on interrogation.    Plan:  No medication changes Please notify VAD Coordinators if you have issues obtaining your medication CT [facility] 8[facility] Referral placed for cardiac rehab Coumadin  dosing per [facility] Return in 3 weeks for follow up with Dr.Bensimhon  Schuyler Lunger RN, BSN VAD Coordinator 24/7 Pager  [phone]     

## 2023-08-16 NOTE — Telephone Encounter (Signed)
 SW was able to assist with having medication filled at [facility]. Medication was provided to patient while in office. Additional information from courier was also discussed with patient to try to help pt locate original shipment. Nothing further needed at this time.

## 2023-08-16 NOTE — Progress Notes (Signed)
  Echocardiogram 2D Echocardiogram has been performed.  Koleen KANDICE Popper, RDCS 08/16/2023, 9:56 AM

## 2023-08-16 NOTE — Progress Notes (Signed)
 Speed  Flow  PI  Power  LVIDD  AI  Aortic opening MR  TR  Septum  RV  VTI (>18cm)  5500 3.7 6.3 4.2 6.8 none 0/5  none mild midline Mod/sev 5.83   5600 3.6 5.3 4.2 6.91 none 0/5 none mild midline Mod/sev 6.31   5700 3.6 4.7 4.3 6.51 mild 0/5 none mild midline Mod/sev 6.55                                             Doppler MAP: 118 Auto cuff BP: 92/78 (84)   Ramp ECHO performed at bedside per Dr.Bensimhon  At completion of ramp study, patients primary controller programmed:  Fixed speed: 5700 Low speed limit:5400   Charles Holmes PEAK, BSN VAD Coordinator 24/7 Pager [phone]

## 2023-08-19 NOTE — Progress Notes (Signed)
 LVAD CLINIC NOTE  PCP: Rothfuss, Lang DASEN, PA-C Cardiologist: Redell Leiter, MD HF MD:    HPI:  [name] is a 58 y.o. 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 Underwent HM- 3 LVAD implant on 08/13 + clipping left atrial appendage    Admitted to [facility] 11/21 with CP. Found to have VT and EF 25-30% mild AI, MR and TR. Transferred to [facility]. Cath showed 70% prox LAD with no intervention.    S/P CRT-D upgrade 06/08/21.   6/24, admitted w/ acute CVA due to right M1 occlusion treated w/ mechanical thrombectomy.   Echo showed LV markedly dilated EF < 20% and small effusion. RV ok. CVA felt to be cardioembolic.   Admitted in 7/24 for low-output HF. Angiography showed mild nonobstructive CAD. RHC hemodynamics c/w severe NICM w/ severely elevated filling pressures and low output state c/w cardiogenic shock. Repeat PFTs w/ severe obstructive defect (FEV1 1.04L, FEV1/FVC 48%) +response to bronchodilator.   Underwent LVAD implant on 08/13 + clipping left atrial appendage d/t severe thickening and invagination of mitral valve annulus impeding flows. Apical core sent to pathology - no mention of sarcoid.   Post VAD implant c/b left-sided hemiplegia. CT head 8/14 with acute R MCA infarct. Taken to IR for thrombectomy. F/u head CT with small hemorrhagic conversion. Had some residual deficits w/ mild left sided weakness but otherwise recovered. Course further c/b enterococcus faecalis bacteremia w/ 2/2 + cultures on 9/6. ID consulted. Recommended ampicillin  and ceftriaxone . PICC removed. Now on suppressive amoxicillin   Underwent cystoscopy/uretoscopy with ureteral stent placement on 04/16/23 for gross hematuria . R ureter and kidney not well visualized due to bleeding. S/p repeat cystoscopy/uteroscopy on 4/25. Stent removed by pt 05/23/23 per urology instructions. Recently had follow up with Dr Elisabeth. Biopsies from procedure were inconclusive.  Here for 2  weeks f/u. At last visit c/o fatigue and exertional dyspnea. Speed increased to 5500. Brough back to day for f/u. Says he is mildly improved. Still easily fatigued.No edema, orthopnea or D, Has been taking meds but says he has been out of silldenafil x 2 weeks. Taking lasix  as needed (took 40mg  only once this week  Pt had complaints of increased shortness of breath and generalized fatigue last visit. Speed increased to 5500. Flow trending lower than baseline. Pt reports mild improvement in shortness of breath and fatigue since last visit. He reports being out of Sildenafil  for 2 weeks due to complications with delivery. Medication refilled at [facility] and given to pt today's appointment. He reports occasional dizziness if he gets up too quickly. Denies signs of bleeding. Pt states he took PRN Lasix  (40mg ) once so far this week No bleeding, melena or neuro symptoms. No VAD alarms.    VAD Indication: Destination Therapy - sarcoid   LVAD assessment: HM III VAD Speed: 5500>5700 Flow: 3.7> 3.6 Power: 4.2 w> 4.2w PI: 6.3>4.7   Alarms: none Events: 0-30 daily   Fixed speed: 5700 Low speed limit: 5400    Primary controller: back up battery due for replacement in 20 months Secondary controller: back up battery due for replacement in 23 months   I reviewed the LVAD parameters from today and compared the results to the patient's prior recorded data. LVAD interrogation was NEGATIVE for significant power changes, NEGATIVE for clinical alarms and STABLE for PI events/speed drops. No programming changes were made and pump is functioning within specified parameters. Pt is performing daily controller  and system monitor self tests along with completing weekly and monthly maintenance for LVAD equipment.   LVAD equipment check completed and is in good working order. Back-up equipment not present at today's visit.   Annual Equipment Maintenance on UBC/PM was performed on 08/02/23.   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   Stroke (HCC)    Systolic heart failure (HCC) 2021   LVEF 18%, RVEF 38% on cardiac MRI 12/19/2019. possible cardiac sarcoidosis.   Wide-complex tachycardia 2021   wears LifeVest    Current Outpatient Medications  Medication Sig Dispense Refill   acetaminophen  (TYLENOL ) 325 MG tablet Take 650 mg by mouth as needed (2-3 TABS AS NEEDED).     amoxicillin  (AMOXIL ) 500 MG tablet Take 2 tablets (1,000 mg total) by mouth 2 (two) times daily. 360 tablet 3   aspirin  EC 81 MG tablet Take 1 tablet (81 mg total) by mouth daily. Swallow whole. 120 tablet 0   atorvastatin  (LIPITOR ) 80 MG tablet Take 1 tablet (80 mg total) by mouth daily. 90 tablet 3   dorzolamide -timolol  (COSOPT ) 2-0.5 % ophthalmic solution Place 1 drop into both eyes 2 (two) times daily.     fluticasone-salmeterol (ADVAIR  HFA) 230-21 MCG/ACT inhaler Inhale 2 puffs into the lungs 2 (two) times daily. 1 each 12   latanoprost  (XALATAN ) 0.005 % ophthalmic solution Place 1 drop into both eyes at bedtime.     melatonin 5 MG TABS Take 5 mg by mouth at bedtime.     mexiletine (MEXITIL ) 250 MG capsule TAKE 1 CAPSULE BY MOUTH 2 TIMES DAILY. 180 capsule 3   nystatin  cream (MYCOSTATIN ) Apply 1 Application topically 2 (two) times daily as needed (itching). 30 g 3   pantoprazole  (PROTONIX ) 40 MG tablet Take 1 tablet (40 mg total) by mouth daily before breakfast. 30 tablet 0    sertraline  (ZOLOFT ) 50 MG tablet Take 1 tablet (50 mg total) by mouth daily. 90 tablet 3   sildenafil  (REVATIO ) 20 MG tablet Take 1 tablet (20 mg total) by mouth 3 (three) times daily. (Patient taking differently: Take 1 tablet (20 mg total) by mouth 3 (three) times daily.) 90 tablet 11   traZODone  (DESYREL ) 100 MG tablet Take 100 mg by mouth at bedtime as needed.     warfarin (COUMADIN ) 3 MG tablet Take 3 mg (1 tablet) daily or as directed by the advanced heart failure clinic 180 tablet 3   No current facility-administered medications for this encounter.    Pacerone  [amiodarone ] and Oxycodone  Vitals:   08/16/23 0904 08/16/23 0905  BP: 92/78 (!) 108/0  Pulse: 73   SpO2: 94%   Weight: 81.7 kg (180 lb 3.2 oz)      Vital Signs:  Doppler Pressure: 108 Automatic BP: 92/78 (84) HR: 73 SR SPO2: 94% RA   Weight: 180.2 lb w/ eqt Discharge weight: 178 lb w/ eqt  Physical Exam: General:  NAD.  HEENT: normal  Neck: supple. JVP not elevated.  Carotids 2+ bilat; no bruits. No lymphadenopathy or thryomegaly appreciated. Cor: LVAD hum.  Lungs: Clear. Abdomen: soft, nontender, non-distended. No hepatosplenomegaly. No bruits or masses. Good bowel sounds. Driveline site clean. Anchor in place.  Extremities: no cyanosis, clubbing, rash. Warm no edema  Neuro: alert & oriented x 3. No focal deficits. Moves all 4 without problem     ASSESSMENT AND PLAN:   1.  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 Presented with VT. LHC 70% LAD  - cMRI 12/21 concerning for sarcoid and EF 18%.  - PET 2/22 at [facility] EF 25% + active sarcoid - Echo 08/26/20 EF < 20% severely dilated LV RV mildly decreased.  - Medtronic CRT-D upgrade in 06/08/21 - Echo 07/10/22: EF <20%, RV okay, mod pericardial effusion, mod MR/TR - 08/29/22 HM III LVAD implant + clipping LAA d/t severe thickening and invagination of mitral valve annulus impeding flows. Core biopsy not c/w sarcoid  - Recently worse NYHA  III - Ramp echo done today 08/16/23. VAD cannula well positioned. AoV not opening. Moderate RV dysfunction.  Speed increased 5500 to 5700 with minimal change in VAD parameters - Will get CT chest to exclude EOGO.  - See back in 2 weeks. If no improvement, arrange RHC - Will refer to CR   2. HM-3 LVAD - VAD interrogated personally. Speed increased 5500 to 5700 with minimal change in VAD parameters - LDH 209 - DL site OK (on suppressive amox) - MAPs ok - Hgb 15.8 - INR 1.6 Goal 1.5-2.0 with recent hematuria,.  Remains on ASA 81 mg daily (on for CVA). Discussed warfarin dosing with PharmD personally. Can likely increase INR goal up to 2.0=2.5 next visit  3. Gross hematuria - being followed by Urology - Underwent cystoscopy/uretoscopy with ureteral stent placement on 04/16/23 . R ureter and kidney not well visualized due to bleeding.  - Had repeat cystoscopy/uteroscopy on 4/25. Stent now out - Hematuria resolved. Path inconclusive to date -No change. Hematuria resolved   4.  H/o stroke - Admitted 06/24 w/ R MCA stroke. S/p TPA and mechanical clot extraction. No residual deficits. Likely cardioembolic in setting of severe LV dysfunction. - Developed left sided weakness 08/14. CTA with R MCA infarct. Taken to IR for thrombectomy - Repeat CT head with small to moderate size hemorrhagic conversion.  - Recovered well with just mild LUE weakness. - Stable no change  5. Enterococcus faecalis bacteremia - Bcx 2/2 on 9/6 - ID consult 9/6 -> ampicillin  and ceftriaxone  - Echo 09/10 - no obvious vegetations - Completed 6 weeks of IV Ampicillin  and Ceftriaxone . Now on chronic suppression with Amoxicillin . - DL site looks good. Continue suppressive abx.    6. Hx VT - ln setting of potential sarcoid heart disease though core biopsy negative at time of VAD placement - Off amio due to tremor. Continue mexiletine  - VT quiescent currently   7. CAD - LHC 12/07/19 70-% LAD, no intervention - LHC 8/24  non obstructive CAD.  - Continue statin. On aspirin  for VAD. -No s/s angina   8. Possible cardiac sarcoid - PET 2/22 at [facility] EF 25% + active sarcoid - Has completed prednisone .  - holding methotrexate  w/ recent surgery and active infection, can discuss timing of restarting the medication at outpatient follow-up - apical core pathology not diagnostic of cardiac sarcoidosis.  - PET negative for sarcoid, Will not restart methotrexate  - No change   9. Paroxsymal AT/AF -  Remains in NSR   10. Pulmonary  - PFTs with severe obstructive defect, response to bronchodilator. FEV1 1.04L, FEV1/FVC 48% - Encouraged Pulmonary f/u with increased SOB   I spent a total of 50 minutes today: 1) reviewing the patient's medical records including previous charts, labs and recent notes from other providers; 2) examining the patient and counseling them on their medical issues/explaining the plan of care; 3) adjusting meds as needed and 4) ordering lab work or other needed tests.     Toribio Fuel, MD  2:35 PM

## 2023-08-21 ENCOUNTER — Ambulatory Visit (HOSPITAL_COMMUNITY)
Admission: RE | Admit: 2023-08-21 | Discharge: 2023-08-21 | Disposition: A | Discharge status: Home / Self Care | Source: Ambulatory Visit | Attending: Cardiology | Admitting: Cardiology

## 2023-08-21 VITALS — BP 108/87 | Wt 179.4 lb

## 2023-08-21 DIAGNOSIS — Z4509 Encounter for adjustment and management of other cardiac device: Secondary | ICD-10-CM | POA: Insufficient documentation

## 2023-08-21 DIAGNOSIS — Z95811 Presence of heart assist device: Secondary | ICD-10-CM | POA: Diagnosis not present

## 2023-08-21 NOTE — Progress Notes (Signed)
 Pt presented to VAD Clinic this afternoon after calling and reporting feeling of  his chest thumping  when he is standing up. Advised to come to VAD Clinic for VAD interrogation. Pt denies lightheadedness, dizziness, falls, shortness of breath or any signs of bleeding. Upon VAD interrogation pt's speed had ramped down to low speed once today at 1300. Pt asymptomatic during speed drop other than a mild thumping sensation. States he has much more energy since increasing speed last week. Pt states he has not been staying as hydrated as he should. He states he is approximately drinking 34oz of fluid per day. VAD Coordinator encouraged increasing hydration to 2L per day and advised pt to notify VAD Coordinator if he continues to have speed drops despite proper hydration.  Vital Signs:  Automatic BP: 108/87 (94)   Weight: 179.4 lb w/ eqt Discharge weight: 180.2 lb w/ eqt   VAD Indication: Destination Therapy - sarcoid   LVAD assessment: HM III VAD Speed: 5700 Flow: 4.1 Power: 4.4w PI: 2.7   Alarms: none Events: 0-40 daily   Fixed speed: 5700 Low speed limit: 5400   Schuyler Gladis PEAK, BSN VAD Coordinator 24/7 Pager [phone]

## 2023-08-22 ENCOUNTER — Ambulatory Visit (HOSPITAL_COMMUNITY): Payer: Self-pay | Admitting: Pharmacist

## 2023-08-22 LAB — POCT INR: INR: 2.1 (ref 2.0–3.0)

## 2023-08-24 ENCOUNTER — Telehealth (HOSPITAL_COMMUNITY): Payer: Self-pay

## 2023-08-24 NOTE — Telephone Encounter (Addendum)
 Called pt and he refers to go to [place] for his cardiac rehab. Will fax referral to [facility].  Closed referral

## 2023-08-27 DIAGNOSIS — N401 Enlarged prostate with lower urinary tract symptoms: Secondary | ICD-10-CM | POA: Diagnosis not present

## 2023-08-27 DIAGNOSIS — R350 Frequency of micturition: Secondary | ICD-10-CM | POA: Diagnosis not present

## 2023-08-27 DIAGNOSIS — N3021 Other chronic cystitis with hematuria: Secondary | ICD-10-CM | POA: Diagnosis not present

## 2023-08-28 ENCOUNTER — Other Ambulatory Visit (HOSPITAL_COMMUNITY): Payer: Self-pay | Admitting: *Deleted

## 2023-08-28 DIAGNOSIS — I5022 Chronic systolic (congestive) heart failure: Secondary | ICD-10-CM

## 2023-08-28 DIAGNOSIS — Z95811 Presence of heart assist device: Secondary | ICD-10-CM

## 2023-08-28 MED ORDER — PANTOPRAZOLE SODIUM 40 MG PO TBEC
40.0000 mg | DELAYED_RELEASE_TABLET | Freq: Every day | ORAL | 3 refills | Status: AC
  Filled 2023-11-19: qty 90, 90d supply, fill #0
  Filled 2024-02-13: qty 90, 90d supply, fill #1

## 2023-08-29 ENCOUNTER — Ambulatory Visit (HOSPITAL_COMMUNITY): Payer: Self-pay | Admitting: Pharmacist

## 2023-08-29 LAB — POCT INR: INR: 1.3 — AB (ref 2.0–3.0)

## 2023-08-30 ENCOUNTER — Ambulatory Visit (HOSPITAL_BASED_OUTPATIENT_CLINIC_OR_DEPARTMENT_OTHER)
Admission: RE | Admit: 2023-08-30 | Discharge: 2023-08-30 | Disposition: A | Discharge status: Home / Self Care | Source: Ambulatory Visit | Attending: Internal Medicine | Admitting: Internal Medicine

## 2023-08-30 DIAGNOSIS — Z95811 Presence of heart assist device: Secondary | ICD-10-CM

## 2023-08-30 DIAGNOSIS — I5023 Acute on chronic systolic (congestive) heart failure: Secondary | ICD-10-CM

## 2023-08-30 DIAGNOSIS — I2510 Atherosclerotic heart disease of native coronary artery without angina pectoris: Secondary | ICD-10-CM | POA: Diagnosis not present

## 2023-08-30 DIAGNOSIS — D869 Sarcoidosis, unspecified: Secondary | ICD-10-CM | POA: Diagnosis not present

## 2023-08-30 DIAGNOSIS — N281 Cyst of kidney, acquired: Secondary | ICD-10-CM | POA: Diagnosis not present

## 2023-08-30 DIAGNOSIS — R918 Other nonspecific abnormal finding of lung field: Secondary | ICD-10-CM | POA: Diagnosis not present

## 2023-08-30 MED ORDER — IOHEXOL 350 MG/ML SOLN
100.0000 mL | Freq: Once | INTRAVENOUS | Status: AC | PRN
  Administered 2023-08-30: 80 mL via INTRAVENOUS

## 2023-09-05 ENCOUNTER — Ambulatory Visit (HOSPITAL_COMMUNITY): Payer: Self-pay | Admitting: Pharmacist

## 2023-09-05 LAB — POCT INR: INR: 1.6 — AB (ref 2.0–3.0)

## 2023-09-10 ENCOUNTER — Telehealth (HOSPITAL_BASED_OUTPATIENT_CLINIC_OR_DEPARTMENT_OTHER): Payer: Self-pay | Admitting: Student

## 2023-09-10 NOTE — Telephone Encounter (Signed)
 Copied from CRM #[id number]. Topic: Clinical - Medication Question >> Sep 10, 2023  3:01 PM Willma R wrote: Reason for CRM: Therisa from [facility] calling stating patient moving forward would like to get all his medications at [facility].   Patient can be reached at [phone]

## 2023-09-11 ENCOUNTER — Other Ambulatory Visit: Payer: Self-pay

## 2023-09-11 ENCOUNTER — Other Ambulatory Visit (HOSPITAL_COMMUNITY): Payer: Self-pay

## 2023-09-11 ENCOUNTER — Other Ambulatory Visit (HOSPITAL_BASED_OUTPATIENT_CLINIC_OR_DEPARTMENT_OTHER): Payer: Self-pay

## 2023-09-11 MED ORDER — MEXILETINE HCL 250 MG PO CAPS
250.0000 mg | ORAL_CAPSULE | Freq: Two times a day (BID) | ORAL | 0 refills | Status: DC
  Filled 2023-09-11 – 2023-10-08 (×2): qty 180, 90d supply, fill #0

## 2023-09-11 MED ORDER — AMOXICILLIN 500 MG PO CAPS
1000.0000 mg | ORAL_CAPSULE | Freq: Two times a day (BID) | ORAL | 3 refills | Status: DC
  Filled 2023-09-11: qty 360, 90d supply, fill #0
  Filled 2023-12-03: qty 234, 59d supply, fill #1
  Filled 2023-12-03: qty 126, 31d supply, fill #1

## 2023-09-11 MED ORDER — AMOXICILLIN 500 MG PO TABS: 1000.0000 mg | ORAL_TABLET | Freq: Two times a day (BID) | ORAL | 3 refills | Status: DC

## 2023-09-11 MED ORDER — SILDENAFIL CITRATE 20 MG PO TABS
20.0000 mg | ORAL_TABLET | Freq: Three times a day (TID) | ORAL | 11 refills | Status: DC
  Filled 2023-10-01: qty 90, 30d supply, fill #0

## 2023-09-12 ENCOUNTER — Other Ambulatory Visit (HOSPITAL_COMMUNITY): Payer: Self-pay

## 2023-09-12 ENCOUNTER — Ambulatory Visit (HOSPITAL_COMMUNITY): Payer: Self-pay | Admitting: Pharmacist

## 2023-09-12 ENCOUNTER — Ambulatory Visit (INDEPENDENT_AMBULATORY_CARE_PROVIDER_SITE_OTHER): Payer: Medicare Other

## 2023-09-12 DIAGNOSIS — I442 Atrioventricular block, complete: Secondary | ICD-10-CM | POA: Diagnosis not present

## 2023-09-12 DIAGNOSIS — Z95811 Presence of heart assist device: Secondary | ICD-10-CM

## 2023-09-12 DIAGNOSIS — Z7901 Long term (current) use of anticoagulants: Secondary | ICD-10-CM

## 2023-09-12 LAB — POCT INR: INR: 1.8 — AB (ref 2.0–3.0)

## 2023-09-13 ENCOUNTER — Ambulatory Visit (HOSPITAL_COMMUNITY)
Admission: RE | Admit: 2023-09-13 | Discharge: 2023-09-13 | Disposition: A | Discharge status: Home / Self Care | Source: Ambulatory Visit | Attending: Internal Medicine | Admitting: Internal Medicine

## 2023-09-13 ENCOUNTER — Ambulatory Visit: Payer: Self-pay | Admitting: Cardiology

## 2023-09-13 VITALS — Ht 68.0 in | Wt 181.4 lb

## 2023-09-13 DIAGNOSIS — I472 Ventricular tachycardia, unspecified: Secondary | ICD-10-CM

## 2023-09-13 DIAGNOSIS — Z4509 Encounter for adjustment and management of other cardiac device: Secondary | ICD-10-CM | POA: Diagnosis not present

## 2023-09-13 DIAGNOSIS — I428 Other cardiomyopathies: Secondary | ICD-10-CM | POA: Diagnosis not present

## 2023-09-13 DIAGNOSIS — K219 Gastro-esophageal reflux disease without esophagitis: Secondary | ICD-10-CM | POA: Diagnosis not present

## 2023-09-13 DIAGNOSIS — I69354 Hemiplegia and hemiparesis following cerebral infarction affecting left non-dominant side: Secondary | ICD-10-CM | POA: Diagnosis not present

## 2023-09-13 DIAGNOSIS — I480 Paroxysmal atrial fibrillation: Secondary | ICD-10-CM | POA: Diagnosis not present

## 2023-09-13 DIAGNOSIS — I110 Hypertensive heart disease with heart failure: Secondary | ICD-10-CM | POA: Insufficient documentation

## 2023-09-13 DIAGNOSIS — I2510 Atherosclerotic heart disease of native coronary artery without angina pectoris: Secondary | ICD-10-CM | POA: Diagnosis not present

## 2023-09-13 DIAGNOSIS — R5383 Other fatigue: Secondary | ICD-10-CM | POA: Insufficient documentation

## 2023-09-13 DIAGNOSIS — Z7901 Long term (current) use of anticoagulants: Secondary | ICD-10-CM | POA: Diagnosis not present

## 2023-09-13 DIAGNOSIS — Z95810 Presence of automatic (implantable) cardiac defibrillator: Secondary | ICD-10-CM | POA: Diagnosis not present

## 2023-09-13 DIAGNOSIS — D8685 Sarcoid myocarditis: Secondary | ICD-10-CM | POA: Diagnosis not present

## 2023-09-13 DIAGNOSIS — I5022 Chronic systolic (congestive) heart failure: Secondary | ICD-10-CM | POA: Diagnosis not present

## 2023-09-13 DIAGNOSIS — Z792 Long term (current) use of antibiotics: Secondary | ICD-10-CM | POA: Insufficient documentation

## 2023-09-13 DIAGNOSIS — R7881 Bacteremia: Secondary | ICD-10-CM | POA: Insufficient documentation

## 2023-09-13 DIAGNOSIS — Z95811 Presence of heart assist device: Secondary | ICD-10-CM | POA: Insufficient documentation

## 2023-09-13 DIAGNOSIS — J449 Chronic obstructive pulmonary disease, unspecified: Secondary | ICD-10-CM | POA: Insufficient documentation

## 2023-09-13 DIAGNOSIS — H409 Unspecified glaucoma: Secondary | ICD-10-CM | POA: Insufficient documentation

## 2023-09-13 DIAGNOSIS — R310 Gross hematuria: Secondary | ICD-10-CM | POA: Insufficient documentation

## 2023-09-13 DIAGNOSIS — B952 Enterococcus as the cause of diseases classified elsewhere: Secondary | ICD-10-CM | POA: Insufficient documentation

## 2023-09-13 DIAGNOSIS — R0609 Other forms of dyspnea: Secondary | ICD-10-CM

## 2023-09-13 LAB — CUP PACEART REMOTE DEVICE CHECK
Battery Remaining Longevity: 50 mo
Battery Voltage: 2.98 V
Brady Statistic AP VP Percent: 0.49 %
Brady Statistic AP VS Percent: 0.03 %
Brady Statistic AS VP Percent: 98.17 %
Brady Statistic AS VS Percent: 1.32 %
Brady Statistic RA Percent Paced: 0.51 %
Brady Statistic RV Percent Paced: 97.87 %
Date Time Interrogation Session: 20250827022823
HighPow Impedance: 67 Ohm
Implantable Lead Connection Status: 753985
Implantable Lead Connection Status: 753985
Implantable Lead Connection Status: 753985
Implantable Lead Implant Date: 20220204
Implantable Lead Implant Date: 20220204
Implantable Lead Implant Date: 20230523
Implantable Lead Location: 753858
Implantable Lead Location: 753859
Implantable Lead Location: 753860
Implantable Lead Model: 4598
Implantable Lead Model: 5076
Implantable Pulse Generator Implant Date: 20230523
Lead Channel Impedance Value: 182.4 Ohm
Lead Channel Impedance Value: 182.4 Ohm
Lead Channel Impedance Value: 190.884
Lead Channel Impedance Value: 228 Ohm
Lead Channel Impedance Value: 241.412
Lead Channel Impedance Value: 266 Ohm
Lead Channel Impedance Value: 304 Ohm
Lead Channel Impedance Value: 323 Ohm
Lead Channel Impedance Value: 399 Ohm
Lead Channel Impedance Value: 456 Ohm
Lead Channel Impedance Value: 456 Ohm
Lead Channel Impedance Value: 513 Ohm
Lead Channel Impedance Value: 589 Ohm
Lead Channel Impedance Value: 665 Ohm
Lead Channel Impedance Value: 665 Ohm
Lead Channel Impedance Value: 703 Ohm
Lead Channel Impedance Value: 817 Ohm
Lead Channel Impedance Value: 817 Ohm
Lead Channel Pacing Threshold Amplitude: 0.875 V
Lead Channel Pacing Threshold Amplitude: 1 V
Lead Channel Pacing Threshold Amplitude: 1.375 V
Lead Channel Pacing Threshold Pulse Width: 0.4 ms
Lead Channel Pacing Threshold Pulse Width: 0.4 ms
Lead Channel Pacing Threshold Pulse Width: 1 ms
Lead Channel Sensing Intrinsic Amplitude: 0.5 mV
Lead Channel Sensing Intrinsic Amplitude: 0.5 mV
Lead Channel Sensing Intrinsic Amplitude: 1.25 mV
Lead Channel Sensing Intrinsic Amplitude: 1.25 mV
Lead Channel Setting Pacing Amplitude: 1.5 V
Lead Channel Setting Pacing Amplitude: 2 V
Lead Channel Setting Pacing Amplitude: 2.75 V
Lead Channel Setting Pacing Pulse Width: 0.4 ms
Lead Channel Setting Pacing Pulse Width: 1 ms
Lead Channel Setting Sensing Sensitivity: 0.3 mV

## 2023-09-13 LAB — CBC
HCT: 49.6 % (ref 39.0–52.0)
Hemoglobin: 16 g/dL (ref 13.0–17.0)
MCH: 29.3 pg (ref 26.0–34.0)
MCHC: 32.3 g/dL (ref 30.0–36.0)
MCV: 90.8 fL (ref 80.0–100.0)
Platelets: 162 K/uL (ref 150–400)
RBC: 5.46 MIL/uL (ref 4.22–5.81)
RDW: 18.4 % — ABNORMAL HIGH (ref 11.5–15.5)
WBC: 6 K/uL (ref 4.0–10.5)
nRBC: 0 % (ref 0.0–0.2)

## 2023-09-13 LAB — BASIC METABOLIC PANEL WITH GFR
Anion gap: 9 (ref 5–15)
BUN: 10 mg/dL (ref 6–20)
CO2: 27 mmol/L (ref 22–32)
Calcium: 9.2 mg/dL (ref 8.9–10.3)
Chloride: 107 mmol/L (ref 98–111)
Creatinine, Ser: 0.46 mg/dL — ABNORMAL LOW (ref 0.61–1.24)
GFR, Estimated: >60 mL/min (ref >60)
Glucose, Bld: 99 mg/dL (ref 70–99)
Potassium: 4 mmol/L (ref 3.5–5.1)
Sodium: 143 mmol/L (ref 135–145)

## 2023-09-13 LAB — PROTIME-INR
INR: 1.8 — ABNORMAL HIGH (ref 0.8–1.2)
Prothrombin Time: 21.9 s — ABNORMAL HIGH (ref 11.4–15.2)

## 2023-09-13 LAB — LACTATE DEHYDROGENASE: LDH: 263 U/L — ABNORMAL HIGH (ref 98–192)

## 2023-09-13 NOTE — Progress Notes (Signed)
 Pt presents for 3 week f/u in VAD Clinic today alone. Reports no problems with VAD equipment or concerns with drive line.   Pt continues with generalized fatigue last visit. Denies signs of bleeding. Pt did not start CR because he misunderstood Dr Bensimhon and thought he needed a stent. He told this to CR and they wanted him to wait until after stent was placed. Pt was informed by DR Bensimhon today that he doesn't need a stent but may need RHC if CR doesn't help with his fatigue. Per Dr Cherrie we have also ordered a sleep study.  Pt reports better compliance with inhalers prescribed for COPD (Advair  HFA) but is not taking any prescription drops for glaucoma.   Patient remains on chronic suppressive Amoxicillin  1000 mg BID for previous Enterococcus Faecalis bacteremia.   S/p cystoscopy/uteroscopy on 4/25. Stent removed by pt 05/23/23 per urology instructions. Biopsies from procedure were inconclusive. Pt tells me that he has hematuria on and off but he states that he doesn't worry about it and he has no pain.  Vital Signs:  Doppler Pressure: 88 Automatic BP: 96/84 (90) HR: 75 SR SPO2: 94% RA   Weight: 181.4 lb w/ eqt Discharge weight: 180.2 lb w/ eqt   VAD Indication: Destination Therapy - sarcoid   LVAD assessment: HM III VAD Speed: 5700 Flow: 5.2 Power: 4.7w PI: 2.3  Alarms: none Events: 20-40 daily   Fixed speed: 5700 Low speed limit: 5400   Primary controller: back up battery due for replacement in 19 months Secondary controller: back up battery due for replacement in 23 months   I reviewed the LVAD parameters from today and compared the results to the patient's prior recorded data. LVAD interrogation was NEGATIVE for significant power changes, NEGATIVE for clinical alarms and STABLE for PI events/speed drops. No programming changes were made and pump is functioning within specified parameters. Pt is performing daily controller and system monitor self tests along with  completing weekly and monthly maintenance for LVAD equipment.   LVAD equipment check completed and is in good working order. Back-up equipment not present at today's visit.   Annual Equipment Maintenance on UBC/PM was performed on 08/02/23.  Exit Site Care:  Existing VAD dressing clean, dry, and intact. Anchor correctly applied. Improvement in skin irrigation related to adhesive from anchor. Pt given 8 weekly dressing kits for home use.   Significant Events on VAD Support:  08/30/22: Acute infarct seen on the right temporal cortex and basal ganglia    Device: Medtronic Therapies: ON Last check: 06/13/22   BP & Labs:  MAP 88 - Doppler is reflecting modified systolic   Hgb 16 - No S/S of bleeding. Specifically denies melena/BRBPR or nosebleeds.   LDH 263 with established baseline of 180- 245. Denies tea-colored urine. No power elevations noted on interrogation.    Plan:  We will get you scheduled for a sleep study Please get started with cardiac rehab Return to clinic in 2 months  Lauraine Ip RN, BSN VAD Coordinator 24/7 Pager [phone]

## 2023-09-13 NOTE — Patient Instructions (Signed)
 We will get you scheduled for a sleep study Please get started with cardiac rehab Return to clinic in 2 months

## 2023-09-13 NOTE — Progress Notes (Signed)
 LVAD CLINIC NOTE  PCP: Rothfuss, Lang DASEN, PA-C Cardiologist: Redell Leiter, MD HF MD:    HPI:  [name] is a 58 y.o. 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 Underwent HM- 3 LVAD implant on 08/13 + clipping left atrial appendage    Admitted to [facility] 11/21 with CP. Found to have VT and EF 25-30% mild AI, MR and TR. Transferred to [facility]. Cath showed 70% prox LAD with no intervention.    S/P CRT-D upgrade 06/08/21.   6/24, admitted w/ acute CVA due to right M1 occlusion treated w/ mechanical thrombectomy.   Echo showed LV markedly dilated EF < 20% and small effusion. RV ok. CVA felt to be cardioembolic.   Admitted in 7/24 for low-output HF. Angiography showed mild nonobstructive CAD. RHC hemodynamics c/w severe NICM w/ severely elevated filling pressures and low output state c/w cardiogenic shock. Repeat PFTs w/ severe obstructive defect (FEV1 1.04L, FEV1/FVC 48%) +response to bronchodilator.   Underwent LVAD implant on 08/13 + clipping left atrial appendage d/t severe thickening and invagination of mitral valve annulus impeding flows. Apical core sent to pathology - no mention of sarcoid.   Post VAD implant c/b left-sided hemiplegia. CT head 8/14 with acute R MCA infarct. Taken to IR for thrombectomy. F/u head CT with small hemorrhagic conversion. Had some residual deficits w/ mild left sided weakness but otherwise recovered. Course further c/b enterococcus faecalis bacteremia w/ 2/2 + cultures on 9/6. ID consulted. Recommended ampicillin  and ceftriaxone . PICC removed. Now on suppressive amoxicillin   Underwent cystoscopy/uretoscopy with ureteral stent placement on 04/16/23 for gross hematuria . R ureter and kidney not well visualized due to bleeding. S/p repeat cystoscopy/uteroscopy on 4/25. Stent removed by pt 05/23/23 per urology instructions. Recently had follow up with Dr Elisabeth. Biopsies from procedure were inconclusive.  Ramp echo  7/25  Speed increased to 5700   Here for 3 weeks f/u. Still complaining of some exertional dyspnea and fatigue. But seems to do ADLs without problem. At alst visit we referred to CR but he held off because he thought he might need a stent. Denies CP, orthopnea or PND. No fevers, chills or problems with driveline. No bleeding, melena or neuro symptoms. No VAD alarms. Taking all meds as prescribed.     . Flow trending lower than baseline. Pt reports mild improvement in shortness of breath and fatigue since last visit. He reports being out of Sildenafil  for 2 weeks due to complications with delivery. Medication refilled at [facility] and given to pt today's appointment. He reports occasional dizziness if he gets up too quickly. Denies signs of bleeding. Pt states he took PRN Lasix  (40mg ) once so far this week No bleeding, melena or neuro symptoms. No VAD alarms.     VAD Indication: Destination Therapy - sarcoid   LVAD assessment: HM III VAD Speed: 5700 Flow: 5.2 Power: 4.7w PI: 2.3   Alarms: none Events: 20-40 daily   Fixed speed: 5700 Low speed limit: 5400    Primary controller: back up battery due for replacement in 19 months Secondary controller: back up battery due for replacement in 23 months   I reviewed the LVAD parameters from today and compared the results to the patient's prior recorded data. LVAD interrogation was NEGATIVE for significant power changes, NEGATIVE for clinical alarms and STABLE for PI events/speed drops. No programming changes were made and pump is functioning within specified parameters. Pt is performing daily controller  and system monitor self tests along with completing weekly and monthly maintenance for LVAD equipment.   LVAD equipment check completed and is in good working order. Back-up equipment not present at today's visit.   Annual Equipment Maintenance on UBC/PM was performed on 08/02/23.   Exit Site Care:  Existing VAD dressing clean,  dry, and intact. Anchor correctly applied. Improvement in skin irrigation related to adhesive from anchor. Pt given 8 weekly dressing kits for home use.  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   Stroke (HCC)    Systolic heart failure (HCC) 2021   LVEF 18%, RVEF 38% on cardiac MRI 12/19/2019. possible cardiac sarcoidosis.   Wide-complex tachycardia 2021   wears LifeVest    Current Outpatient Medications  Medication Sig Dispense Refill   acetaminophen  (TYLENOL ) 325 MG tablet Take 650 mg by mouth as needed (2-3 TABS AS NEEDED).     amoxicillin  (AMOXIL ) 500 MG tablet Take 2 tablets (1,000 mg total) by mouth 2 (two) times daily. 360 tablet 3   aspirin  EC 81 MG tablet Take 1 tablet (81 mg total) by mouth daily. Swallow whole. 120 tablet 0   atorvastatin  (LIPITOR ) 80 MG tablet Take 1 tablet (80 mg total) by mouth daily. 90 tablet 3   fluticasone-salmeterol (ADVAIR  HFA) 230-21 MCG/ACT inhaler Inhale 2 puffs into the lungs 2 (two) times daily. 1 each 12   melatonin 5 MG TABS Take 5 mg by mouth at bedtime.     mexiletine (MEXITIL ) 250 MG capsule TAKE 1 CAPSULE BY MOUTH 2 TIMES DAILY. 180 capsule 3   nystatin  cream (MYCOSTATIN ) Apply topically to the affected area 2 (two) times daily as needed (itching). 30 g 3   pantoprazole  (PROTONIX ) 40 MG tablet Take 1 tablet (40 mg total) by mouth daily before breakfast. 90 tablet 3   sertraline  (ZOLOFT ) 50 MG tablet  Take 1 tablet (50 mg total) by mouth daily. 90 tablet 3   sildenafil  (REVATIO ) 20 MG tablet Take 1 tablet (20 mg total) by mouth 3 (three) times daily. 90 tablet 11   traZODone  (DESYREL ) 100 MG tablet Take 100 mg by mouth at bedtime as needed.     warfarin (COUMADIN ) 3 MG tablet Take 3 mg (1 tablet) by mouth daily or as directed by the advanced heart failure clinic 180 tablet 3   amoxicillin  (AMOXIL ) 500 MG capsule Take 2 capsules (1,000 mg total) by mouth 2 (two) times daily. 360 capsule 3   dorzolamide -timolol  (COSOPT ) 2-0.5 % ophthalmic solution Place 1 drop into both eyes 2 (two) times daily. (Patient not taking: Reported on 09/13/2023)     latanoprost  (XALATAN ) 0.005 % ophthalmic solution Place 1 drop into both eyes at bedtime. (Patient not taking: Reported on 09/13/2023)     mexiletine (MEXITIL ) 250 MG capsule Take 1 capsule (250 mg total) by mouth 2 (two) times daily. 180 capsule 0   sildenafil  (REVATIO ) 20 MG tablet  Take 1 tablet (20 mg total) by mouth 3 (three) times daily. 90 tablet 11   No current facility-administered medications for this encounter.    Pacerone  [amiodarone ] and Oxycodone  Vitals:   09/13/23 1351  Weight: 82.3 kg (181 lb 6.4 oz)  Height: 5' 8 (1.727 m)     Vital Signs:  Doppler Pressure: 88 Automatic BP: 96/84 (90) HR: 75 SR SPO2: 94% RA   Weight: 181.4 lb w/ eqt Discharge weight: 180.2 lb w/ eqt  Physical Exam: General:  NAD.  HEENT: normal  Neck: supple. JVP not elevated.  Carotids 2+ bilat; no bruits. No lymphadenopathy or thryomegaly appreciated. Cor: LVAD hum.  Lungs: Clear.but decreased throughout Abdomen: soft, nontender, non-distended. No hepatosplenomegaly. No bruits or masses. Good bowel sounds. Driveline site clean. Anchor in place.  Extremities: no cyanosis, clubbing, rash. Warm no edema  Neuro: alert & oriented x 3. No focal deficits. Moves all 4 without problem x mild LUE weakness    ASSESSMENT AND PLAN:   1.  Chronic Systolic  HF-->Cardiogenic Shock  - Diagnosed 11/2019. Presented with VT. LHC 70% LAD  - cMRI 12/21 concerning for sarcoid and EF 18%.  - PET 2/22 at [facility] EF 25% + active sarcoid - Echo 08/26/20 EF < 20% severely dilated LV RV mildly decreased.  - [employer] CRT-D upgrade in 06/08/21 - Echo 07/10/22: EF <20%, RV okay, mod pericardial effusion, mod MR/TR - 08/29/22 HM III LVAD implant + clipping LAA d/t severe thickening and invagination of mitral valve annulus impeding flows. Core biopsy not c/w sarcoid  - Ramp echo done 08/16/23. VAD cannula well positioned. AoV not opening. Moderate RV dysfunction.  Speed increased 5500 to 5700 with minimal change in VAD parameters - CT chest 8/25 negative for EOGO + for chronic lung disease - Remains NYHA III but suspect mostly due to lung disease and deconditioning  - Refer to CR - RHC if no improvement   2. HM-3 LVAD - VAD interrogated personally. Parameters stable. - LDH 263 - DL site ok(on suppressive amox) - MAPs ok  - Hgb 16.0 - INR 1.8 Goal 1.5-2.0 with recent hematuria,.  Remains on ASA 81 mg daily (on for CVA). Discussed warfarin dosing with PharmD personally.. Can likely increase INR goal up to 2.0=2.5 next visit  3. Gross hematuria - being followed by Urology - Underwent cystoscopy/uretoscopy with ureteral stent placement on 04/16/23 . R ureter and kidney not well visualized due to bleeding.  - Had repeat cystoscopy/uteroscopy on 4/25. Stent now out - Resolved. Path inconclusive   4.  H/o stroke - Admitted 06/24 w/ R MCA stroke. S/p TPA and mechanical clot extraction. No residual deficits. Likely cardioembolic in setting of severe LV dysfunction. - Developed left sided weakness 08/14. CTA with R MCA infarct. Taken to IR for thrombectomy - Repeat CT head with small to moderate size hemorrhagic conversion.  - Recovered well with just mild LUE weakness. - Stable no change  5. Enterococcus faecalis bacteremia - Bcx 2/2 on 9/6 - ID consult 9/6 ->  ampicillin  and ceftriaxone  - Echo 09/10 - no obvious vegetations - Completed 6 weeks of IV Ampicillin  and Ceftriaxone . Now on chronic suppression with Amoxicillin . - DL site looks good    6. Hx VT - ln setting of potential sarcoid heart disease though core biopsy negative at time of VAD placement - Off amio due to tremor. Continue mexiletine  - VT quiescent   7. CAD - LHC 12/07/19 70-% LAD, no intervention - LHC 8/24 non obstructive  CAD.  - Continue statin. On aspirin  for VAD. - No s/s angina   8. Possible cardiac sarcoid - PET 2/22 at [facility] EF 25% + active sarcoid - Has completed prednisone .  - holding methotrexate  w/ recent surgery and active infection, can discuss timing of restarting the medication at outpatient follow-up - apical core pathology not diagnostic of cardiac sarcoidosis.  - PET negative for sarcoid, Will not restart methotrexate  - NNo change   9. Paroxsymal AT/AF - Remains in NSR - Warfarin management as above   10. Pulmonary  - PFTs with severe obstructive defect, response to bronchodilator. FEV1 1.04L, FEV1/FVC 48% - Encouraged Pulmonary f/u with increased SOB - Suspect this is main source of dyspnea   I spent a total of 47 minutes today: 1) reviewing the patient's medical records including previous charts, labs and recent notes from other providers; 2) examining the patient and counseling them on their medical issues/explaining the plan of care; 3) adjusting meds as needed and 4) ordering lab work or other needed tests.     Toribio Fuel, MD  3:07 PM

## 2023-09-19 ENCOUNTER — Ambulatory Visit (HOSPITAL_COMMUNITY): Payer: Self-pay | Admitting: Pharmacist

## 2023-09-19 LAB — POCT INR: INR: 1.8 — AB (ref 2.0–3.0)

## 2023-09-20 ENCOUNTER — Encounter: Payer: Self-pay | Admitting: Student

## 2023-09-25 DIAGNOSIS — Z7982 Long term (current) use of aspirin: Secondary | ICD-10-CM | POA: Diagnosis not present

## 2023-09-25 DIAGNOSIS — I110 Hypertensive heart disease with heart failure: Secondary | ICD-10-CM | POA: Diagnosis not present

## 2023-09-25 DIAGNOSIS — Z95811 Presence of heart assist device: Secondary | ICD-10-CM | POA: Diagnosis not present

## 2023-09-25 DIAGNOSIS — Z79899 Other long term (current) drug therapy: Secondary | ICD-10-CM | POA: Diagnosis not present

## 2023-09-25 DIAGNOSIS — J449 Chronic obstructive pulmonary disease, unspecified: Secondary | ICD-10-CM | POA: Diagnosis not present

## 2023-09-25 DIAGNOSIS — I5022 Chronic systolic (congestive) heart failure: Secondary | ICD-10-CM | POA: Diagnosis not present

## 2023-09-25 DIAGNOSIS — Z8673 Personal history of transient ischemic attack (TIA), and cerebral infarction without residual deficits: Secondary | ICD-10-CM | POA: Diagnosis not present

## 2023-09-26 ENCOUNTER — Ambulatory Visit (HOSPITAL_COMMUNITY): Payer: Self-pay | Admitting: Pharmacist

## 2023-09-26 LAB — POCT INR: INR: 1.6 — AB (ref 2.0–3.0)

## 2023-09-30 NOTE — Addendum Note (Signed)
 Encounter addended by: Katelynd Blauvelt R, MD on: 09/30/2023 6:21 PM  Actions taken: Clinical Note Signed, Visit diagnoses modified, Charge Capture section accepted

## 2023-10-01 ENCOUNTER — Other Ambulatory Visit (HOSPITAL_BASED_OUTPATIENT_CLINIC_OR_DEPARTMENT_OTHER): Payer: Self-pay

## 2023-10-01 NOTE — Progress Notes (Signed)
Remote ICD Transmission.

## 2023-10-03 ENCOUNTER — Ambulatory Visit (HOSPITAL_COMMUNITY): Payer: Self-pay | Admitting: Pharmacist

## 2023-10-03 LAB — POCT INR: INR: 1.4 — AB (ref 2.0–3.0)

## 2023-10-04 ENCOUNTER — Other Ambulatory Visit (HOSPITAL_BASED_OUTPATIENT_CLINIC_OR_DEPARTMENT_OTHER): Payer: Self-pay

## 2023-10-08 ENCOUNTER — Other Ambulatory Visit (HOSPITAL_BASED_OUTPATIENT_CLINIC_OR_DEPARTMENT_OTHER): Payer: Self-pay

## 2023-10-10 ENCOUNTER — Ambulatory Visit (HOSPITAL_COMMUNITY): Payer: Self-pay | Admitting: Pharmacist

## 2023-10-10 LAB — POCT INR: INR: 1.2 — AB (ref 2.0–3.0)

## 2023-10-12 ENCOUNTER — Other Ambulatory Visit (HOSPITAL_BASED_OUTPATIENT_CLINIC_OR_DEPARTMENT_OTHER): Payer: Self-pay

## 2023-10-13 LAB — ECHOCARDIOGRAM LIMITED: Est EF: <20

## 2023-10-17 ENCOUNTER — Ambulatory Visit (HOSPITAL_COMMUNITY): Payer: Self-pay | Admitting: Pharmacist

## 2023-10-17 LAB — POCT INR: INR: 1.6 — AB (ref 2.0–3.0)

## 2023-10-18 ENCOUNTER — Other Ambulatory Visit (HOSPITAL_BASED_OUTPATIENT_CLINIC_OR_DEPARTMENT_OTHER): Payer: Self-pay

## 2023-10-19 ENCOUNTER — Telehealth (HOSPITAL_COMMUNITY): Payer: Self-pay | Admitting: *Deleted

## 2023-10-19 NOTE — Telephone Encounter (Signed)
 1540: Pt called VAD clinic reporting he has been feeling weak and short of breath for several days. Denies fever, chills, blood in stool. Reports urine looks like pink lemonade but has been this way for 6 - 8 weeks. Denies pain or burning with urination. States weight has been stable 180 - 181 lbs. Doesn't know if he has fluid on board. Reports he took PRN Lasix  40 mg earlier this week with good urine output. Does not know how much he took. VAD #s stable: Speed 5700 Flow 4.7 PI 2.8 Power 4.5 w. Denies VAD alarms. Reports drinking less than 2 L daily.   Discussed with Dr Cherrie. Advised to take an additional Lasix  40 mg today.   Pt aware of the above instruction. Advised to page VAD coordinator over the weekend if his symptoms worsen. Advised to make sure he is drinking 2 L daily. He verbalized understanding to all instructions.   Isaiah Knoll RN VAD Coordinator  Office: [phone]  24/7 Pager: [phone]

## 2023-10-22 ENCOUNTER — Other Ambulatory Visit (HOSPITAL_BASED_OUTPATIENT_CLINIC_OR_DEPARTMENT_OTHER): Payer: Self-pay

## 2023-10-22 DIAGNOSIS — I110 Hypertensive heart disease with heart failure: Secondary | ICD-10-CM | POA: Diagnosis not present

## 2023-10-22 DIAGNOSIS — Z7982 Long term (current) use of aspirin: Secondary | ICD-10-CM | POA: Diagnosis not present

## 2023-10-22 DIAGNOSIS — Z79899 Other long term (current) drug therapy: Secondary | ICD-10-CM | POA: Diagnosis not present

## 2023-10-22 DIAGNOSIS — Z95811 Presence of heart assist device: Secondary | ICD-10-CM | POA: Diagnosis not present

## 2023-10-22 DIAGNOSIS — Z8673 Personal history of transient ischemic attack (TIA), and cerebral infarction without residual deficits: Secondary | ICD-10-CM | POA: Diagnosis not present

## 2023-10-22 DIAGNOSIS — I5022 Chronic systolic (congestive) heart failure: Secondary | ICD-10-CM | POA: Diagnosis not present

## 2023-10-22 DIAGNOSIS — J449 Chronic obstructive pulmonary disease, unspecified: Secondary | ICD-10-CM | POA: Diagnosis not present

## 2023-10-24 ENCOUNTER — Ambulatory Visit (HOSPITAL_COMMUNITY): Payer: Self-pay | Admitting: Pharmacist

## 2023-10-24 ENCOUNTER — Encounter (HOSPITAL_BASED_OUTPATIENT_CLINIC_OR_DEPARTMENT_OTHER): Payer: Self-pay

## 2023-10-24 ENCOUNTER — Ambulatory Visit (HOSPITAL_BASED_OUTPATIENT_CLINIC_OR_DEPARTMENT_OTHER)

## 2023-10-24 DIAGNOSIS — Z8673 Personal history of transient ischemic attack (TIA), and cerebral infarction without residual deficits: Secondary | ICD-10-CM

## 2023-10-24 DIAGNOSIS — Z95811 Presence of heart assist device: Secondary | ICD-10-CM

## 2023-10-24 LAB — POCT INR: INR: 1.8 — AB (ref 2.0–3.0)

## 2023-10-31 ENCOUNTER — Ambulatory Visit (HOSPITAL_COMMUNITY): Payer: Self-pay | Admitting: Pharmacist

## 2023-10-31 DIAGNOSIS — Z95811 Presence of heart assist device: Secondary | ICD-10-CM

## 2023-10-31 LAB — POCT INR: INR: 1.6 — AB (ref 2.0–3.0)

## 2023-11-01 ENCOUNTER — Ambulatory Visit (HOSPITAL_BASED_OUTPATIENT_CLINIC_OR_DEPARTMENT_OTHER): Attending: Internal Medicine | Admitting: Cardiology

## 2023-11-01 ENCOUNTER — Ambulatory Visit (INDEPENDENT_AMBULATORY_CARE_PROVIDER_SITE_OTHER): Admitting: Student

## 2023-11-01 ENCOUNTER — Encounter (HOSPITAL_BASED_OUTPATIENT_CLINIC_OR_DEPARTMENT_OTHER): Payer: Self-pay | Admitting: Student

## 2023-11-01 VITALS — Temp 97.5°F | Resp 16 | Ht 68.0 in | Wt 183.0 lb

## 2023-11-01 DIAGNOSIS — R0683 Snoring: Secondary | ICD-10-CM | POA: Insufficient documentation

## 2023-11-01 DIAGNOSIS — Z131 Encounter for screening for diabetes mellitus: Secondary | ICD-10-CM | POA: Diagnosis not present

## 2023-11-01 DIAGNOSIS — I480 Paroxysmal atrial fibrillation: Secondary | ICD-10-CM

## 2023-11-01 DIAGNOSIS — E782 Mixed hyperlipidemia: Secondary | ICD-10-CM | POA: Diagnosis not present

## 2023-11-01 DIAGNOSIS — Z8673 Personal history of transient ischemic attack (TIA), and cerebral infarction without residual deficits: Secondary | ICD-10-CM

## 2023-11-01 DIAGNOSIS — I10 Essential (primary) hypertension: Secondary | ICD-10-CM | POA: Diagnosis not present

## 2023-11-01 DIAGNOSIS — I5022 Chronic systolic (congestive) heart failure: Secondary | ICD-10-CM

## 2023-11-01 DIAGNOSIS — Z95811 Presence of heart assist device: Secondary | ICD-10-CM

## 2023-11-01 DIAGNOSIS — J449 Chronic obstructive pulmonary disease, unspecified: Secondary | ICD-10-CM

## 2023-11-01 DIAGNOSIS — F5101 Primary insomnia: Secondary | ICD-10-CM | POA: Diagnosis not present

## 2023-11-01 NOTE — Patient Instructions (Signed)
 It was nice to see you today!  If you have any problems before your next visit feel free to message me via MyChart (minor issues or questions) or call the office, otherwise you may reach out to schedule an office visit.  Thank you! Pau Banh, PA-C

## 2023-11-01 NOTE — Progress Notes (Signed)
 Established Patient Office Visit  Subjective   Patient ID: Charles Holmes, male    DOB: 10/03/1965  Age: 58 y.o. MRN: [id number]  Chief Complaint  Patient presents with   Medical Management of Chronic Issues    Follow up. Having sleep study tonight. Trazodone  and Melatonin does not work.    HPI  Discussed the use of AI scribe software for clinical note transcription with the patient, who gave verbal consent to proceed.  History of Present Illness   Charles Holmes is a 58 year old male with an LVAD who presents for a follow-up visit.  He has difficulty obtaining accurate blood pressure readings at home due to lack of equipment, and previous attempts to measure his blood pressure in the clinic were unsuccessful. He does not have a Doppler at home.  He is managing his LVAD and has a follow-up appointment next month. His warfarin management is going well, with regular INR checks every Wednesday morning, and his recent INRs have been stable.  No breathing issues and no recent use of albuterol . He is on Advair  without any major issues. No muscle aches or joint pains from atorvastatin  use.  He has been on Lasix  since his last visit, but not frequently. He is doing cardio exercises three times a week, which he reports has improved his energy levels.  He is scheduled for a sleep study tonight and has previously tried trazodone  and melatonin for sleep issues, with trazodone  possibly at a dose of 100 mg. He experiences frequent nocturia, which disrupts his sleep, and has difficulty both falling and staying asleep.  No major anxiety but has a lot on his mind at night, affecting his sleep. He is on Zoloft  50 mg, and his depression score has improved from 11 to 6 since the last visit.  He has not had labs since August, which were done during a visit to another provider. He gets up frequently at night to urinate, affecting his sleep quality.  No recent coffee intake and has not eaten  today, which is relevant for upcoming lab tests.         11/01/2023   11:14 AM 08/01/2023    2:02 PM  GAD 7 : Generalized Anxiety Score  Nervous, Anxious, on Edge 0 1  Control/stop worrying 0 1  Worry too much - different things 0 1  Trouble relaxing 3 1  Restless 0 0  Easily annoyed or irritable 1 1  Afraid - awful might happen 0 1  Total GAD 7 Score 4 6  Anxiety Difficulty Not difficult at all Somewhat difficult      11/01/2023   11:13 AM 08/01/2023    2:02 PM 02/26/2023   10:53 AM  PHQ9 SCORE ONLY  PHQ-9 Total Score 6 11  1       Data saved with a previous flowsheet row definition     ROS Per HPI.    Objective:     Temp (!) 97.5 F (36.4 C) (Oral)   Resp 16   Ht 5' 8 (1.727 m)   Wt 183 lb (83 kg)   SpO2 96%   BMI 27.83 kg/m    Physical Exam Constitutional:      General: He is not in acute distress.    Appearance: Normal appearance. He is not ill-appearing.  HENT:     Head: Normocephalic and atraumatic.     Right Ear: External ear normal.     Left Ear: External ear normal.  Nose: Nose normal.  Eyes:     Conjunctiva/sclera: Conjunctivae normal.  Pulmonary:     Effort: Pulmonary effort is normal. No respiratory distress.     Breath sounds: Normal breath sounds. No wheezing, rhonchi or rales.  Skin:    General: Skin is warm and dry.     Coloration: Skin is not jaundiced or pale.  Neurological:     Mental Status: He is alert.  Psychiatric:        Mood and Affect: Mood normal.        Behavior: Behavior normal.      Results for orders placed or performed in visit on 11/01/23  CBC with Differential/Platelet  Result Value Ref Range   WBC 5.4 3.4 - 10.8 x10E3/uL   RBC 5.64 4.14 - 5.80 x10E6/uL   Hemoglobin 17.5 13.0 - 17.7 g/dL   Hematocrit 45.6 (H) 62.4 - 51.0 %   MCV 96 79 - 97 fL   MCH 31.0 26.6 - 33.0 pg   MCHC 32.2 31.5 - 35.7 g/dL   RDW 84.1 (H) 88.3 - 84.5 %   Platelets 162 150 - 450 x10E3/uL   Neutrophils 69 Not Estab. %   Lymphs  15 Not Estab. %   Monocytes 13 Not Estab. %   Eos 2 Not Estab. %   Basos 1 Not Estab. %   Neutrophils Absolute 3.8 1.4 - 7.0 x10E3/uL   Lymphocytes Absolute 0.8 0.7 - 3.1 x10E3/uL   Monocytes Absolute 0.7 0.1 - 0.9 x10E3/uL   EOS (ABSOLUTE) 0.1 0.0 - 0.4 x10E3/uL   Basophils Absolute 0.1 0.0 - 0.2 x10E3/uL   Immature Granulocytes 0 Not Estab. %   Immature Grans (Abs) 0.0 0.0 - 0.1 x10E3/uL  Comprehensive metabolic panel with GFR  Result Value Ref Range   Glucose 68 (L) 70 - 99 mg/dL   BUN 12 8 - 27 mg/dL   Creatinine, Ser 8.97 0.76 - 1.27 mg/dL   eGFR 84 >40 fO/fpw/8.26   BUN/Creatinine Ratio 12 10 - 24   Sodium 141 134 - 144 mmol/L   Potassium 4.6 3.5 - 5.2 mmol/L   Chloride 101 96 - 106 mmol/L   CO2 26 20 - 29 mmol/L   Calcium  9.3 8.6 - 10.2 mg/dL   Total Protein 6.6 6.0 - 8.5 g/dL   Albumin  4.4 3.8 - 4.9 g/dL   Globulin, Total 2.2 1.5 - 4.5 g/dL   Bilirubin Total 1.1 0.0 - 1.2 mg/dL   Alkaline Phosphatase 124 (H) 47 - 123 IU/L   AST 33 0 - 40 IU/L   ALT 30 0 - 44 IU/L  Lipid panel  Result Value Ref Range   Cholesterol, Total 130 100 - 199 mg/dL   Triglycerides 55 0 - 149 mg/dL   HDL 45 >60 mg/dL   VLDL Cholesterol Cal 12 5 - 40 mg/dL   LDL Chol Calc (NIH) 73 0 - 99 mg/dL   Chol/HDL Ratio 2.9 0.0 - 5.0 ratio  Hemoglobin A1c  Result Value Ref Range   Hgb A1c MFr Bld 5.1 4.8 - 5.6 %   Est. average glucose Bld gHb Est-mCnc 100 mg/dL      The ASCVD Risk score (Arnett DK, et al., 2019) failed to calculate for the following reasons:   Risk score cannot be calculated because patient has a medical history suggesting prior/existing ASCVD    Assessment & Plan:   Assessment and Plan    Heart failure with LVAD Heart failure is well-managed with LVAD. No fluid overload.  Regular cardio rehab is beneficial for energy levels. - Continue cardio rehab three times a week  Atrial fibrillation (paroxysmal) Chronic, stable. Well-managed with warfarin. INR levels are stable. -  Continue warfarin therapy  Insomnia Difficulty falling and staying asleep, possibly due to anxiety and nocturia. Previous trials of trazodone  and melatonin were ineffective. Doxepin is considered as a new treatment option. - Would like to give more time before trying a new medication for sleep.  Depression Chronic, stable. Depression score decreased from 11 to 6. No major anxiety reported. - Continue Zoloft  50 mg daily  Hyperlipidemia- History of CVA- LDL goal < 70 Chronic, stable. Managed with atorvastatin . No muscle aches or joint pains reported. - Ordered cholesterol test  Hypertension - BP unable to be assessed today due to LVAD presence, deferred to next visit, non toxic appearance. - Assess CBC and CMP      Return in about 6 months (around 05/01/2024), or if symptoms worsen or fail to improve, for Annual Physical.    Lang DASEN Tyrome Donatelli, PA-C

## 2023-11-02 ENCOUNTER — Ambulatory Visit (HOSPITAL_BASED_OUTPATIENT_CLINIC_OR_DEPARTMENT_OTHER)

## 2023-11-02 ENCOUNTER — Telehealth (HOSPITAL_BASED_OUTPATIENT_CLINIC_OR_DEPARTMENT_OTHER)

## 2023-11-02 ENCOUNTER — Ambulatory Visit (HOSPITAL_BASED_OUTPATIENT_CLINIC_OR_DEPARTMENT_OTHER): Payer: Self-pay | Admitting: Student

## 2023-11-02 LAB — COMPREHENSIVE METABOLIC PANEL WITH GFR
ALT: 30 IU/L (ref 0–44)
AST: 33 IU/L (ref 0–40)
Albumin: 4.4 g/dL (ref 3.8–4.9)
Alkaline Phosphatase: 124 IU/L — ABNORMAL HIGH (ref 47–123)
BUN/Creatinine Ratio: 12 (ref 10–24)
BUN: 12 mg/dL (ref 8–27)
Bilirubin Total: 1.1 mg/dL (ref 0.0–1.2)
CO2: 26 mmol/L (ref 20–29)
Calcium: 9.3 mg/dL (ref 8.6–10.2)
Chloride: 101 mmol/L (ref 96–106)
Creatinine, Ser: 1.02 mg/dL (ref 0.76–1.27)
Globulin, Total: 2.2 g/dL (ref 1.5–4.5)
Glucose: 68 mg/dL — ABNORMAL LOW (ref 70–99)
Potassium: 4.6 mmol/L (ref 3.5–5.2)
Sodium: 141 mmol/L (ref 134–144)
Total Protein: 6.6 g/dL (ref 6.0–8.5)
eGFR: 84 mL/min/1.73 (ref >59)

## 2023-11-02 LAB — LIPID PANEL
Chol/HDL Ratio: 2.9 ratio (ref 0.0–5.0)
Cholesterol, Total: 130 mg/dL (ref 100–199)
HDL: 45 mg/dL (ref >39)
LDL Chol Calc (NIH): 73 mg/dL (ref 0–99)
Triglycerides: 55 mg/dL (ref 0–149)
VLDL Cholesterol Cal: 12 mg/dL (ref 5–40)

## 2023-11-02 LAB — CBC WITH DIFFERENTIAL/PLATELET
Basophils Absolute: 0.1 x10E3/uL (ref 0.0–0.2)
Basos: 1 %
EOS (ABSOLUTE): 0.1 x10E3/uL (ref 0.0–0.4)
Eos: 2 %
Hematocrit: 54.3 % — ABNORMAL HIGH (ref 37.5–51.0)
Hemoglobin: 17.5 g/dL (ref 13.0–17.7)
Immature Grans (Abs): 0 x10E3/uL (ref 0.0–0.1)
Immature Granulocytes: 0 %
Lymphocytes Absolute: 0.8 x10E3/uL (ref 0.7–3.1)
Lymphs: 15 %
MCH: 31 pg (ref 26.6–33.0)
MCHC: 32.2 g/dL (ref 31.5–35.7)
MCV: 96 fL (ref 79–97)
Monocytes Absolute: 0.7 x10E3/uL (ref 0.1–0.9)
Monocytes: 13 %
Neutrophils Absolute: 3.8 x10E3/uL (ref 1.4–7.0)
Neutrophils: 69 %
Platelets: 162 x10E3/uL (ref 150–450)
RBC: 5.64 x10E6/uL (ref 4.14–5.80)
RDW: 15.8 % — ABNORMAL HIGH (ref 11.6–15.4)
WBC: 5.4 x10E3/uL (ref 3.4–10.8)

## 2023-11-02 LAB — HEMOGLOBIN A1C
Est. average glucose Bld gHb Est-mCnc: 100 mg/dL
Hgb A1c MFr Bld: 5.1 % (ref 4.8–5.6)

## 2023-11-07 ENCOUNTER — Ambulatory Visit (HOSPITAL_COMMUNITY): Payer: Self-pay | Admitting: Pharmacist

## 2023-11-07 DIAGNOSIS — Z95811 Presence of heart assist device: Secondary | ICD-10-CM

## 2023-11-07 LAB — POCT INR: INR: 1.6 — AB (ref 2.0–3.0)

## 2023-11-13 ENCOUNTER — Encounter (HOSPITAL_COMMUNITY): Admitting: Internal Medicine

## 2023-11-13 NOTE — Procedures (Signed)
  Indications for Polysomnography The patient is a 58 year-old Male who is 5' 7 and weighs 183.0 lbs. His BMI equals 28.7.  A full night polysomnogram was performed to evaluate for obstructive sleep apnea.  MedicationAmoxicillinSildenafilMexiletine Advair  Polysomnogram Data A full night polysomnogram recorded the standard physiologic parameters including EEG, EOG, EMG, EKG, nasal and oral airflow.  Respiratory parameters of chest and abdominal movements were recorded with Respiratory Inductance Plethysmography belts.   Oxygen saturation was recorded by pulse oximetry.  Sleep Architecture The total recording time of the polysomnogram was 402.4 minutes.  The total sleep time was 322.0 minutes.  The patient spent 6.1% of total sleep time in Stage N1, 53.9% in Stage N2, 27.6% in Stages N3, and 12.4% in REM.  Sleep latency was 50.8 minutes.   REM latency was 162.0 minutes.  Sleep Efficiency was 80.0%.  Wake after Sleep Onset time was 29.5 minutes.  Respiratory Events The polysomnogram revealed a presence of 4 obstructive, 6 central, and 0 mixed apneas resulting in an Apnea index of 1.9 events per hour.  There were 14 hypopneas (GreaterEqual to3% desaturation and/or arousal) resulting in an Apnea\Hypopnea Index (AHI  GreaterEqual to3% desaturation and/or arousal) of 4.5 events per hour.  There were 2 hypopneas (GreaterEqual to4% desaturation) resulting in an Apnea\Hypopnea Index (AHI GreaterEqual to4% desaturation) of 2.2 events per hour.  There were 4 Respiratory  Effort Related Arousals resulting in a RERA index of 0.7 events per hour. The Respiratory Disturbance Index is 5.2 events per hour.  The snore index was 61.3 events per hour.  Mean oxygen saturation was 93.4%.  The lowest oxygen saturation during sleep was 89.0%.  Time spent LessEqual to88% oxygen saturation was 0 minutes.  Limb Activity There were 17 total limb movements recorded, of this total, - were classified as PLMs.  PLM index was 0  per hour and PLM associated with Arousals index was 0 per hour.  Cardiac Summary The average pulse rate was 30.2 bpm.  The minimum pulse rate was 30.0 bpm while the maximum pulse rate was 52.0 bpm.  Diagnosis: Normal sleep study  Recommendations: 1. Normal study with no significant sleep disordered breathing. 2. Healthy sleep recommendations include: adequate nightly sleep (normal 7-9 hrs/night), avoidance of caffeine after noon and alcohol  near bedtime, and maintaining a sleep environment that is cool, dark and quiet. 3. Weight loss for overweight patients is recommended. 4. Snoring recommendations include: weight loss where appropriate, side sleeping, and avoidance of alcohol  before bed. 5. Operation of motor vehicle or dangerous equipment must be avoided when feeling drowsy, excessively sleepy, or mentally fatigued. 6. An ENT consultation which may be useful for specific causes of and possible treatment of bothersome snoring . 7. Weight loss may be of benefit in reducing the severity of snoring    This study was personally reviewed and electronically signed by: Wilbert Bihari, MD Accredited Board Certified in Sleep Medicine Date/Time: 11/13/2023 4:20 PM

## 2023-11-14 ENCOUNTER — Telehealth: Payer: Self-pay | Admitting: *Deleted

## 2023-11-14 ENCOUNTER — Ambulatory Visit (HOSPITAL_COMMUNITY): Payer: Self-pay | Admitting: Pharmacist

## 2023-11-14 DIAGNOSIS — Z95811 Presence of heart assist device: Secondary | ICD-10-CM

## 2023-11-14 LAB — POCT INR: INR: 1.6 — AB (ref 2.0–3.0)

## 2023-11-14 NOTE — Telephone Encounter (Signed)
-----   Message from Wilbert Bihari sent at 11/13/2023  4:21 PM EDT ----- Please let patient know that sleep study showed no significant sleep apnea.

## 2023-11-14 NOTE — Telephone Encounter (Signed)
 The patient has been notified of the result. Left message in his mychart portal and informed patient to call back. Joshua Dalton Seip, CMA.

## 2023-11-14 NOTE — Telephone Encounter (Signed)
 Left voicemail for pt rescheduling follow up with Dr Bensimhon to 11/28/23 at 10:00 due to provider availability. Requested call back to clinic to confirm he received message.   Charles Knoll RN VAD Coordinator  Office: [phone]  24/7 Pager: [phone]

## 2023-11-19 ENCOUNTER — Telehealth (HOSPITAL_COMMUNITY): Payer: Self-pay | Admitting: Unknown Physician Specialty

## 2023-11-19 ENCOUNTER — Other Ambulatory Visit (HOSPITAL_BASED_OUTPATIENT_CLINIC_OR_DEPARTMENT_OTHER): Payer: Self-pay

## 2023-11-19 NOTE — Telephone Encounter (Signed)
 Pt called the VAD office c/o hematuria and discomfort in his pelvic area.I called [facility] and was able to get him an appt with DR Elisabeth on Friday 11/7 @ 1500. I updated pt with this info. He was grateful for the help.  Lauraine Ip RN, BSN VAD Coordinator 24/7 Pager [phone]

## 2023-11-21 ENCOUNTER — Other Ambulatory Visit (HOSPITAL_COMMUNITY): Payer: Self-pay | Admitting: Unknown Physician Specialty

## 2023-11-21 ENCOUNTER — Ambulatory Visit (HOSPITAL_COMMUNITY): Payer: Self-pay | Admitting: Pharmacist

## 2023-11-21 ENCOUNTER — Other Ambulatory Visit (HOSPITAL_COMMUNITY): Payer: Self-pay

## 2023-11-21 DIAGNOSIS — I50810 Right heart failure, unspecified: Secondary | ICD-10-CM

## 2023-11-21 DIAGNOSIS — I5022 Chronic systolic (congestive) heart failure: Secondary | ICD-10-CM

## 2023-11-21 DIAGNOSIS — Z95811 Presence of heart assist device: Secondary | ICD-10-CM

## 2023-11-21 LAB — POCT INR: INR: 2.4 (ref 2.0–3.0)

## 2023-11-21 MED ORDER — SILDENAFIL CITRATE 20 MG PO TABS
20.0000 mg | ORAL_TABLET | Freq: Three times a day (TID) | ORAL | 11 refills | Status: DC
  Filled 2023-11-21 – 2023-11-22 (×2): qty 90, 30d supply, fill #0

## 2023-11-22 ENCOUNTER — Other Ambulatory Visit (HOSPITAL_BASED_OUTPATIENT_CLINIC_OR_DEPARTMENT_OTHER): Payer: Self-pay

## 2023-11-22 ENCOUNTER — Other Ambulatory Visit (HOSPITAL_COMMUNITY): Payer: Self-pay

## 2023-11-23 DIAGNOSIS — R82998 Other abnormal findings in urine: Secondary | ICD-10-CM | POA: Diagnosis not present

## 2023-11-23 DIAGNOSIS — R310 Gross hematuria: Secondary | ICD-10-CM | POA: Diagnosis not present

## 2023-11-26 ENCOUNTER — Other Ambulatory Visit (HOSPITAL_COMMUNITY): Payer: Self-pay | Admitting: *Deleted

## 2023-11-26 DIAGNOSIS — I5022 Chronic systolic (congestive) heart failure: Secondary | ICD-10-CM

## 2023-11-26 DIAGNOSIS — Z7901 Long term (current) use of anticoagulants: Secondary | ICD-10-CM

## 2023-11-26 DIAGNOSIS — Z95811 Presence of heart assist device: Secondary | ICD-10-CM

## 2023-11-27 ENCOUNTER — Encounter (HOSPITAL_COMMUNITY): Admitting: Internal Medicine

## 2023-11-28 ENCOUNTER — Ambulatory Visit (HOSPITAL_COMMUNITY): Payer: Self-pay | Admitting: Pharmacist

## 2023-11-28 ENCOUNTER — Ambulatory Visit (HOSPITAL_COMMUNITY)
Admission: RE | Admit: 2023-11-28 | Discharge: 2023-11-28 | Disposition: A | Discharge status: Home / Self Care | Source: Ambulatory Visit | Attending: Internal Medicine | Admitting: Internal Medicine

## 2023-11-28 VITALS — BP 92/0 | HR 70 | Wt 186.4 lb

## 2023-11-28 DIAGNOSIS — I472 Ventricular tachycardia, unspecified: Secondary | ICD-10-CM

## 2023-11-28 DIAGNOSIS — R0609 Other forms of dyspnea: Secondary | ICD-10-CM | POA: Diagnosis not present

## 2023-11-28 DIAGNOSIS — Z7982 Long term (current) use of aspirin: Secondary | ICD-10-CM | POA: Insufficient documentation

## 2023-11-28 DIAGNOSIS — I5022 Chronic systolic (congestive) heart failure: Secondary | ICD-10-CM | POA: Insufficient documentation

## 2023-11-28 DIAGNOSIS — R310 Gross hematuria: Secondary | ICD-10-CM | POA: Diagnosis not present

## 2023-11-28 DIAGNOSIS — Z7901 Long term (current) use of anticoagulants: Secondary | ICD-10-CM | POA: Insufficient documentation

## 2023-11-28 DIAGNOSIS — I480 Paroxysmal atrial fibrillation: Secondary | ICD-10-CM | POA: Insufficient documentation

## 2023-11-28 DIAGNOSIS — Z79899 Other long term (current) drug therapy: Secondary | ICD-10-CM | POA: Insufficient documentation

## 2023-11-28 DIAGNOSIS — Z95811 Presence of heart assist device: Secondary | ICD-10-CM | POA: Diagnosis not present

## 2023-11-28 DIAGNOSIS — R251 Tremor, unspecified: Secondary | ICD-10-CM | POA: Insufficient documentation

## 2023-11-28 DIAGNOSIS — I428 Other cardiomyopathies: Secondary | ICD-10-CM | POA: Insufficient documentation

## 2023-11-28 DIAGNOSIS — I110 Hypertensive heart disease with heart failure: Secondary | ICD-10-CM | POA: Diagnosis not present

## 2023-11-28 DIAGNOSIS — R0602 Shortness of breath: Secondary | ICD-10-CM | POA: Diagnosis not present

## 2023-11-28 DIAGNOSIS — R0600 Dyspnea, unspecified: Secondary | ICD-10-CM | POA: Diagnosis not present

## 2023-11-28 DIAGNOSIS — Z8673 Personal history of transient ischemic attack (TIA), and cerebral infarction without residual deficits: Secondary | ICD-10-CM | POA: Insufficient documentation

## 2023-11-28 DIAGNOSIS — D869 Sarcoidosis, unspecified: Secondary | ICD-10-CM | POA: Insufficient documentation

## 2023-11-28 DIAGNOSIS — I2510 Atherosclerotic heart disease of native coronary artery without angina pectoris: Secondary | ICD-10-CM | POA: Diagnosis not present

## 2023-11-28 LAB — BASIC METABOLIC PANEL WITH GFR
Anion gap: 10 (ref 5–15)
BUN: 12 mg/dL (ref 6–20)
CO2: 28 mmol/L (ref 22–32)
Calcium: 8.8 mg/dL — ABNORMAL LOW (ref 8.9–10.3)
Chloride: 103 mmol/L (ref 98–111)
Creatinine, Ser: 1.25 mg/dL — ABNORMAL HIGH (ref 0.61–1.24)
GFR, Estimated: >60 mL/min (ref >60)
Glucose, Bld: 88 mg/dL (ref 70–99)
Potassium: 4.1 mmol/L (ref 3.5–5.1)
Sodium: 141 mmol/L (ref 135–145)

## 2023-11-28 LAB — CBC
HCT: 51 % (ref 39.0–52.0)
Hemoglobin: 16.6 g/dL (ref 13.0–17.0)
MCH: 31.6 pg (ref 26.0–34.0)
MCHC: 32.5 g/dL (ref 30.0–36.0)
MCV: 97.1 fL (ref 80.0–100.0)
Platelets: 164 K/uL (ref 150–400)
RBC: 5.25 MIL/uL (ref 4.22–5.81)
RDW: 14.6 % (ref 11.5–15.5)
WBC: 6 K/uL (ref 4.0–10.5)
nRBC: 0 % (ref 0.0–0.2)

## 2023-11-28 LAB — LACTATE DEHYDROGENASE: LDH: 225 U/L (ref 105–235)

## 2023-11-28 LAB — PROTIME-INR
INR: 2.1 — ABNORMAL HIGH (ref 0.8–1.2)
Prothrombin Time: 24.9 s — ABNORMAL HIGH (ref 11.4–15.2)

## 2023-11-28 NOTE — Patient Instructions (Signed)
 No medication changes Return to VAD Clinic for follow up with Dr. Bensimhon in 2 months

## 2023-11-28 NOTE — Progress Notes (Signed)
 Pt presents for 3 week f/u in VAD Clinic today alone. Reports no problems with VAD equipment or concerns with drive line.   Pt continues with generalized fatigue last visit. He states his legs feel weak. Currently enrolled in Cardiac Rehab at [facility] 3 times a week. Per Dr. Bensimhon if pt continues to feel weak we can discuss RHC in the near future.   Pt states he continues to have intermittent hematuria. He last saw Urology 11/7. Records requested from [facility]. Pt states he last had hematuria with blood clots this past Thursday. S/p cystoscopy/uteroscopy on 4/25. Stent removed by pt 05/23/23 per urology instructions. Biopsies from procedure were inconclusive.   Pt reports better compliance with inhalers prescribed for COPD (Advair  HFA) but is not taking any prescription drops for glaucoma. Pt had sleep study 11/01/23 with no significant findings.  Patient remains on chronic suppressive Amoxicillin  1000 mg BID for previous Enterococcus Faecalis bacteremia.   Vital Signs:  Doppler Pressure: 92 Automatic BP: 93/82 (87) HR: 70 SR SPO2: 94% RA   Weight: 186.4 lb w/ eqt Discharge weight: 181.4 lb w/ eqt   VAD Indication: Destination Therapy - sarcoid   LVAD assessment: HM III VAD Speed: 5700 Flow: 4.5 Power: 4.5w PI: 2.3  Alarms: none Events: 20-40 daily   Fixed speed: 5700 Low speed limit: 5400   Primary controller: back up battery due for replacement in 19 months Secondary controller: back up battery due for replacement in 23 months   I reviewed the LVAD parameters from today and compared the results to the patient's prior recorded data. LVAD interrogation was NEGATIVE for significant power changes, NEGATIVE for clinical alarms and STABLE for PI events/speed drops. No programming changes were made and pump is functioning within specified parameters. Pt is performing daily controller and system monitor self tests along with completing weekly and monthly maintenance for LVAD  equipment.   LVAD equipment check completed and is in good working order. Back-up equipment not present at today's visit.   Annual Equipment Maintenance on UBC/PM was performed on 08/02/23.  Exit Site Care:  Existing VAD dressing clean, dry, and intact. Anchor correctly applied. Improvement in skin irrigation related to adhesive from anchor. Pt states he has enough dressing kits at home.    Significant Events on VAD Support:  08/30/22: Acute infarct seen on the right temporal cortex and basal ganglia    Device: Medtronic Therapies: ON Last check: 06/13/22   BP & Labs:  MAP 92 - Doppler is reflecting modified systolic   Hgb 16.6 - No S/S of bleeding. Specifically denies melena/BRBPR or nosebleeds.   LDH 225 with established baseline of 180- 245. Denies tea-colored urine. No power elevations noted on interrogation.    Plan:  No medication changes Return to VAD Clinic for follow up with Dr. Bensimhon in 2 months  Schuyler Lunger RN, BSN VAD Coordinator 24/7 Pager [phone]

## 2023-11-28 NOTE — Progress Notes (Signed)
 LVAD CLINIC NOTE  PCP: Rothfuss, Lang DASEN, PA-C Cardiologist: Redell Leiter, MD HF MD:    HPI:  Mr Condie is a 58 y.o. 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 Underwent HM- 3 LVAD implant on 08/13 + clipping left atrial appendage    Admitted to [facility] 11/21 with CP. Found to have VT and EF 25-30% mild AI, MR and TR. Transferred to [facility]. Cath showed 70% prox LAD with no intervention.    S/P CRT-D upgrade 06/08/21.   6/24, admitted w/ acute CVA due to right M1 occlusion treated w/ mechanical thrombectomy.   Echo showed LV markedly dilated EF < 20% and small effusion. RV ok. CVA felt to be cardioembolic.   Admitted in 7/24 for low-output HF. Angiography showed mild nonobstructive CAD. RHC hemodynamics c/w severe NICM w/ severely elevated filling pressures and low output state c/w cardiogenic shock. Repeat PFTs w/ severe obstructive defect (FEV1 1.04L, FEV1/FVC 48%) +response to bronchodilator.   Underwent LVAD implant on 08/13 + clipping left atrial appendage d/t severe thickening and invagination of mitral valve annulus impeding flows. Apical core sent to pathology - no mention of sarcoid.   Post VAD implant c/b left-sided hemiplegia. CT head 8/14 with acute R MCA infarct. Taken to IR for thrombectomy. F/u head CT with small hemorrhagic conversion. Had some residual deficits w/ mild left sided weakness but otherwise recovered. Course further c/b enterococcus faecalis bacteremia w/ 2/2 + cultures on 9/6. ID consulted. Recommended ampicillin  and ceftriaxone . PICC removed. Now on suppressive amoxicillin   Underwent cystoscopy/uretoscopy with ureteral stent placement on 04/16/23 for gross hematuria . R ureter and kidney not well visualized due to bleeding. S/p repeat cystoscopy/uteroscopy on 4/25. Stent removed by pt 05/23/23 per urology instructions. Recently had follow up with Dr Elisabeth. Biopsies from procedure were inconclusive.  Ramp echo  7/25  Speed increased to 5700   Here for routine f/u. Had recurrent painless hematuria this week.but now resolving. Able to do ADLs and go to CR without issue but still c/o fatigue and exertional dyspnea. Denies edema, orthopnea or PND. No fevers, chills or problems with driveline. No bleeding, melena or neuro symptoms. No VAD alarms. Taking all meds as prescribed.     LVAD Documentation    11/28/2023  Device Info  LVAD Type: Heartmate III  Date of Implant: 08/29/2022  Therapy Type: Destination Therapy      11/28/2023  Vitals  Heart Rate: 70 BPM  Automatic BP: 93/82  Doppler MAP: 92 mmHg  SpO2: 94 %    Last 3 Weights Weight Weight  11/28/2023 84.55 kg 186 lb 6.4 oz  11/01/2023 83.008 kg 183 lb  09/13/2023 82.283 kg 181 lb 6.4 oz       11/28/2023  LVAD Paramaters  Speed: 5700 RPM  Flow: 5 LPM  PI: 2  Power: 5 Watts  Hematocrit: 50 %  Alarms: none  Events: 20-40 daily  Last Speed Change Date: 08/16/2023  Last Ramp Echo Date: 08/16/2023  Last Right Heart Cath Date: 09/12/2022  Bleeding History: Yes  Type of Bleeding Hx: Other  Type of Bleeding Hx: hematuria  Type of Dressing: Weekly  Annual Maintenance Date: 02/29/2024    Labs    Units 11/28/23 0955 11/21/23 0000 11/14/23 0000 11/07/23 0000 11/01/23 1306 09/19/23 0000 09/13/23 1316 08/22/23 0000 08/16/23 1011  INR  2.1* 2.4 1.6*   < >  --    < > 1.8*   < > 1.6*  LDH U/L 225  --   --   --   --   --  263*  --  209*  HGB g/dL 83.3  --   --   --  82.4  --  16.0  --  15.8  CREATININE mg/dL 8.74*  --   --   --  8.97  --  0.46*  --  1.19   < > = values in this interval not displayed.        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   Stroke (HCC)    Systolic heart failure (HCC) 2021   LVEF 18%, RVEF 38% on cardiac MRI 12/19/2019. possible cardiac sarcoidosis.   Wide-complex tachycardia 2021   wears LifeVest    Current Outpatient Medications  Medication Sig Dispense Refill   acetaminophen  (TYLENOL ) 325 MG tablet Take 650 mg by mouth as needed (2-3 TABS AS NEEDED).     amoxicillin  (AMOXIL ) 500 MG capsule Take 2 capsules (1,000 mg total) by mouth 2 (two) times daily. 360 capsule 3   aspirin  EC 81 MG tablet Take 1 tablet (81 mg total) by mouth daily. Swallow whole. 120 tablet 0   atorvastatin  (LIPITOR ) 80 MG tablet Take 1 tablet (80 mg total) by mouth daily. 90 tablet 3   fluticasone-salmeterol (ADVAIR  HFA) 230-21 MCG/ACT inhaler Inhale 2 puffs into the lungs 2 (two) times daily. 1 each 12   melatonin 5 MG TABS Take 5 mg by mouth at bedtime.     mexiletine (MEXITIL ) 250 MG capsule TAKE 1 CAPSULE BY MOUTH 2 TIMES DAILY. 180 capsule 3   nystatin  cream (MYCOSTATIN ) Apply topically to the affected area 2 (two) times daily as needed (itching). 30 g 3   pantoprazole  (PROTONIX ) 40 MG tablet Take 1 tablet (40 mg total) by mouth daily before breakfast. 90 tablet 3   sertraline  (ZOLOFT ) 50 MG tablet Take 1 tablet (50 mg total) by mouth daily. 90 tablet 3   sildenafil  (REVATIO ) 20 MG tablet Take 1 tablet (20 mg total) by mouth 3 (three) times daily. 90 tablet 11   traZODone  (DESYREL ) 100 MG tablet Take 100 mg by mouth at bedtime as needed.     warfarin (COUMADIN ) 3 MG tablet Take 3 mg (1 tablet) by mouth daily or as directed by the advanced heart failure clinic 180 tablet 3   dorzolamide -timolol  (COSOPT ) 2-0.5 % ophthalmic solution Place 1 drop into both eyes 2 (two) times  daily. (Patient not taking: Reported on 11/28/2023)     latanoprost  (XALATAN ) 0.005 % ophthalmic solution Place 1 drop into both eyes at bedtime. (Patient not taking: Reported on 11/28/2023)     No current facility-administered medications for this encounter.    Pacerone  [amiodarone ] and Oxycodone  Vitals:   11/28/23 1111 11/28/23 1112  BP: 93/82 (!) 92/0  Pulse: 70   SpO2: 94%   Weight: 84.6 kg (186 lb 6.4 oz)  Physical Exam: General:  NAD.  HEENT: normal  Neck: supple. JVP not elevated.  Carotids 2+ bilat; no bruits. No lymphadenopathy or thryomegaly appreciated. Cor: LVAD hum.  Lungs: Clear. Abdomen: soft, nontender, non-distended. No hepatosplenomegaly. No bruits or masses. Good bowel sounds. Driveline site clean. Anchor in place.  Extremities: no cyanosis, clubbing, rash. Warm no edema  Neuro: alert & oriented x 3. No focal deficits. Moves all 4 without problem     ASSESSMENT AND PLAN:   1.  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 Presented with VT. LHC 70% LAD  - cMRI 12/21 concerning for sarcoid and EF 18%.  - PET 2/22 at [facility] EF 25% + active sarcoid - Echo 08/26/20 EF < 20% severely dilated LV RV mildly decreased.  - Medtronic CRT-D upgrade in 06/08/21 - Echo 07/10/22: EF <20%, RV okay, mod pericardial effusion, mod MR/TR - 08/29/22 HM III LVAD implant + clipping LAA d/t severe thickening and invagination of mitral valve annulus impeding flows. Core biopsy not c/w sarcoid  - Ramp echo done 08/16/23. VAD cannula well positioned. AoV not opening. Moderate RV dysfunction.  Speed increased 5500 to 5700 with minimal change in VAD parameters - CT chest 8/25 negative for EOGO + for chronic lung disease - Continues to report NYHA III symptoms but seems out of proportion to objective findings, Suspect at least in part due to lung disease and deconditioning  - Continue CR - Eventual RHC as needed   2. HM-3 LVAD - VAD interrogated personally.  Parameters stable.  - LDH 225 - DL site ok (on suppressive amox) - MAPs ok - Hgb 16.1 - INR 2.1 Goal 1.5-2.0 with recent hematuria,.  Remains on ASA 81 mg daily (on for CVA). Discussed warfarin dosing with PharmD personally.  3. Gross hematuria - being followed by Urology - Underwent cystoscopy/uretoscopy with ureteral stent placement on 04/16/23 . R ureter and kidney not well visualized due to bleeding.  - Had repeat cystoscopy/uteroscopy on 4/25. Stent now out - Path inconclusive - Has recurred this week. Has f/u with Urology scheduled   4.  H/o stroke - Admitted 06/24 w/ R MCA stroke. S/p TPA and mechanical clot extraction. No residual deficits. Likely cardioembolic in setting of severe LV dysfunction. - Developed left sided weakness 08/14. CTA with R MCA infarct. Taken to IR for thrombectomy - Repeat CT head with small to moderate size hemorrhagic conversion.  - Recovered well with just mild LUE weakness. - Sgtable  5. Enterococcus faecalis bacteremia - Bcx 2/2 on 9/6 - ID consult 9/6 -> ampicillin  and ceftriaxone  - Echo 09/10 - no obvious vegetations - Completed 6 weeks of IV Ampicillin  and Ceftriaxone . Now on chronic suppression with Amoxicillin . - DL site looks good today   6. Hx VT - ln setting of potential sarcoid heart disease though core biopsy negative at time of VAD placement - Off amio due to tremor. Continue mexiletine  - VT quiescent - Labs ok    7. CAD - LHC 12/07/19 70-% LAD, no intervention - LHC 8/24 non obstructive CAD.  - Continue statin. On aspirin  for VAD. - No s/s angina   8. Possible cardiac sarcoid - PET 2/22 at [facility] EF 25% + active sarcoid - Has completed prednisone .  - holding methotrexate  w/ recent surgery and active infection, can discuss timing of restarting the medication at outpatient follow-up - apical core pathology not diagnostic of cardiac sarcoidosis.  - PET negative for sarcoid, Will not restart methotrexate  - No change  9.  Paroxsymal AT/AF - Remains in NSR - Warfarin management as above   10. Pulmonary  - PFTs with severe obstructive defect, response to bronchodilator. FEV1 1.04L, FEV1/FVC 48% - Encouraged Pulmonary f/u with increased SOB - Suspect this is main source of dyspnea - No change   I spent a total of 442 minutes today: 1) reviewing the patient's medical records including previous charts, labs and recent notes from other providers; 2) examining the patient and counseling them on their medical issues/explaining the plan of care; 3) adjusting meds as needed and 4) ordering lab work or other needed tests.     Toribio Fuel, MD  9:21 PM

## 2023-11-30 DIAGNOSIS — Z7982 Long term (current) use of aspirin: Secondary | ICD-10-CM | POA: Diagnosis not present

## 2023-11-30 DIAGNOSIS — Z79899 Other long term (current) drug therapy: Secondary | ICD-10-CM | POA: Diagnosis not present

## 2023-11-30 DIAGNOSIS — Z95811 Presence of heart assist device: Secondary | ICD-10-CM | POA: Diagnosis not present

## 2023-11-30 DIAGNOSIS — Z8673 Personal history of transient ischemic attack (TIA), and cerebral infarction without residual deficits: Secondary | ICD-10-CM | POA: Diagnosis not present

## 2023-11-30 DIAGNOSIS — I5022 Chronic systolic (congestive) heart failure: Secondary | ICD-10-CM | POA: Diagnosis not present

## 2023-11-30 DIAGNOSIS — J449 Chronic obstructive pulmonary disease, unspecified: Secondary | ICD-10-CM | POA: Diagnosis not present

## 2023-11-30 DIAGNOSIS — I110 Hypertensive heart disease with heart failure: Secondary | ICD-10-CM | POA: Diagnosis not present

## 2023-12-03 ENCOUNTER — Other Ambulatory Visit (HOSPITAL_BASED_OUTPATIENT_CLINIC_OR_DEPARTMENT_OTHER): Payer: Self-pay

## 2023-12-05 ENCOUNTER — Ambulatory Visit (HOSPITAL_COMMUNITY): Payer: Self-pay | Admitting: Pharmacist

## 2023-12-05 DIAGNOSIS — Z95811 Presence of heart assist device: Secondary | ICD-10-CM

## 2023-12-05 LAB — POCT INR: INR: 1.7 — AB (ref 2.0–3.0)

## 2023-12-12 ENCOUNTER — Ambulatory Visit: Payer: Medicare Other

## 2023-12-12 ENCOUNTER — Ambulatory Visit (HOSPITAL_COMMUNITY): Payer: Self-pay | Admitting: Pharmacist

## 2023-12-12 DIAGNOSIS — I5022 Chronic systolic (congestive) heart failure: Secondary | ICD-10-CM | POA: Diagnosis not present

## 2023-12-12 DIAGNOSIS — Z95811 Presence of heart assist device: Secondary | ICD-10-CM

## 2023-12-12 LAB — POCT INR: INR: 2 (ref 2.0–3.0)

## 2023-12-14 LAB — CUP PACEART REMOTE DEVICE CHECK
Battery Remaining Longevity: 45 mo
Battery Voltage: 2.97 V
Brady Statistic AP VP Percent: 0.17 %
Brady Statistic AP VS Percent: 0 %
Brady Statistic AS VP Percent: 99.69 %
Brady Statistic AS VS Percent: 0.14 %
Brady Statistic RA Percent Paced: 0.18 %
Brady Statistic RV Percent Paced: 99.71 %
Date Time Interrogation Session: 20251126001705
HighPow Impedance: 68 Ohm
Implantable Lead Connection Status: 753985
Implantable Lead Connection Status: 753985
Implantable Lead Connection Status: 753985
Implantable Lead Implant Date: 20220204
Implantable Lead Implant Date: 20220204
Implantable Lead Implant Date: 20230523
Implantable Lead Location: 753858
Implantable Lead Location: 753859
Implantable Lead Location: 753860
Implantable Lead Model: 4598
Implantable Lead Model: 5076
Implantable Pulse Generator Implant Date: 20230523
Lead Channel Impedance Value: 182.4 Ohm
Lead Channel Impedance Value: 182.4 Ohm
Lead Channel Impedance Value: 190.884
Lead Channel Impedance Value: 228 Ohm
Lead Channel Impedance Value: 241.412
Lead Channel Impedance Value: 247 Ohm
Lead Channel Impedance Value: 304 Ohm
Lead Channel Impedance Value: 304 Ohm
Lead Channel Impedance Value: 399 Ohm
Lead Channel Impedance Value: 456 Ohm
Lead Channel Impedance Value: 456 Ohm
Lead Channel Impedance Value: 513 Ohm
Lead Channel Impedance Value: 627 Ohm
Lead Channel Impedance Value: 646 Ohm
Lead Channel Impedance Value: 665 Ohm
Lead Channel Impedance Value: 703 Ohm
Lead Channel Impedance Value: 817 Ohm
Lead Channel Impedance Value: 836 Ohm
Lead Channel Pacing Threshold Amplitude: 0.875 V
Lead Channel Pacing Threshold Amplitude: 1 V
Lead Channel Pacing Threshold Amplitude: 1.375 V
Lead Channel Pacing Threshold Pulse Width: 0.4 ms
Lead Channel Pacing Threshold Pulse Width: 0.4 ms
Lead Channel Pacing Threshold Pulse Width: 1 ms
Lead Channel Sensing Intrinsic Amplitude: 0.5 mV
Lead Channel Sensing Intrinsic Amplitude: 0.5 mV
Lead Channel Sensing Intrinsic Amplitude: 1.5 mV
Lead Channel Sensing Intrinsic Amplitude: 1.5 mV
Lead Channel Setting Pacing Amplitude: 1.5 V
Lead Channel Setting Pacing Amplitude: 2 V
Lead Channel Setting Pacing Amplitude: 2.75 V
Lead Channel Setting Pacing Pulse Width: 0.4 ms
Lead Channel Setting Pacing Pulse Width: 1 ms
Lead Channel Setting Sensing Sensitivity: 0.3 mV

## 2023-12-18 NOTE — Progress Notes (Signed)
 Remote ICD Transmission

## 2023-12-19 ENCOUNTER — Ambulatory Visit (HOSPITAL_COMMUNITY): Payer: Self-pay

## 2023-12-19 LAB — POCT INR: INR: 1.8 — AB (ref 2.0–3.0)

## 2023-12-19 NOTE — Progress Notes (Signed)
 LVAD INR

## 2023-12-21 ENCOUNTER — Ambulatory Visit: Payer: Self-pay | Admitting: Cardiology

## 2023-12-25 ENCOUNTER — Other Ambulatory Visit (HOSPITAL_COMMUNITY): Payer: Self-pay

## 2023-12-25 ENCOUNTER — Other Ambulatory Visit (HOSPITAL_BASED_OUTPATIENT_CLINIC_OR_DEPARTMENT_OTHER): Payer: Self-pay

## 2023-12-25 DIAGNOSIS — I50810 Right heart failure, unspecified: Secondary | ICD-10-CM

## 2023-12-25 DIAGNOSIS — I5022 Chronic systolic (congestive) heart failure: Secondary | ICD-10-CM

## 2023-12-25 DIAGNOSIS — Z95811 Presence of heart assist device: Secondary | ICD-10-CM

## 2023-12-25 MED ORDER — SILDENAFIL CITRATE 20 MG PO TABS
20.0000 mg | ORAL_TABLET | Freq: Three times a day (TID) | ORAL | 11 refills | Status: DC
  Filled 2023-12-25: qty 90, 30d supply, fill #0

## 2023-12-26 ENCOUNTER — Ambulatory Visit (HOSPITAL_COMMUNITY): Payer: Self-pay | Admitting: Pharmacist

## 2023-12-26 DIAGNOSIS — Z95811 Presence of heart assist device: Secondary | ICD-10-CM

## 2023-12-26 LAB — POCT INR: INR: 2.5 (ref 2.0–3.0)

## 2023-12-27 ENCOUNTER — Other Ambulatory Visit (HOSPITAL_COMMUNITY): Payer: Self-pay | Admitting: Unknown Physician Specialty

## 2023-12-27 ENCOUNTER — Telehealth (HOSPITAL_COMMUNITY): Payer: Self-pay

## 2023-12-27 DIAGNOSIS — I5022 Chronic systolic (congestive) heart failure: Secondary | ICD-10-CM

## 2023-12-27 DIAGNOSIS — Z95811 Presence of heart assist device: Secondary | ICD-10-CM

## 2023-12-27 NOTE — Telephone Encounter (Signed)
 Pt called VAD Clinic this afternoon complaining of increased fatigue, dizziness and bilateral leg weakness. He reports I feel like I did before getting my LVAD. Denies falls, signs of bleeding or stroke like symptoms. He reports he is sleeping all of the time. He is currently enrolled in Cardiac Rehab at [facility] and has not noticed an  increase in his endurance with regular attendance. VAD parameters stable. Speed: 5800 Flow:4.2 PI:2.7 Power: 4.4w  Discussed with Dr. Bensimhon. MD advises for pt to be set up for a RHC early next week. Pt scheduled for RHC Wednesday 01/02/24 at 8:30am. Advised nothing to eat or drink after midnight the night prior to the procedure. Pt will check into admitting at 6:30am 12/17. Pt verbalizes understanding of instructions.   Schuyler Lunger RN, BSN VAD Coordinator 24/7 Pager [phone]

## 2023-12-31 ENCOUNTER — Other Ambulatory Visit (HOSPITAL_BASED_OUTPATIENT_CLINIC_OR_DEPARTMENT_OTHER): Payer: Self-pay

## 2023-12-31 NOTE — Progress Notes (Unsigned)
 Subjective:  Chief Complaint: follow-up for LVAD infection   Patient ID: Charles Holmes, male    DOB: 1965-01-25, 58 y.o.   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   Stroke (HCC)    Systolic heart failure (HCC) 2021   LVEF 18%, RVEF 38% on cardiac MRI 12/19/2019. possible cardiac sarcoidosis.   Wide-complex tachycardia 2021   wears LifeVest    Past Surgical History:  Procedure Laterality Date   BIV UPGRADE N/A 06/07/2021   Procedure: BIV ICD UPGRADE;  Surgeon: Inocencio Soyla Lunger, MD;  [facility] INVASIVE CV LAB;  Service: Cardiovascular;  Laterality: N/A;   CLIPPING OF ATRIAL APPENDAGE Left 08/29/2022   Procedure: CLIPPING OF ATRIAL APPENDAGE;  Surgeon: Obadiah Coy, MD;  Location: [facility] OR;  Service: Open Heart Surgery;  Laterality: Left;   CYSTOSCOPY WITH RETROGRADE PYELOGRAM, URETEROSCOPY AND STENT PLACEMENT Right 04/17/2023   Procedure: CYSTOSCOPY WITH RETROGRADE PYELOGRAM; DIAGNOSTIC RIGHT URETEROSCOPY, RIGHT URETERAL WASHINGS AND STENT INSERTION;  Surgeon: Elisabeth Valli BIRCH, MD;  Location: [facility] OR;  Service: Urology;  Laterality: Right;  CYSTOSCOPY, BILATERAL RETROGRADE PYELOGRAM, RIGHT DIAGNOSTIC URETEROSCOPY, POSSIBLE BIOPSY, POSSIBLE STENT, POSSIBLE LEFT DIAGNOSTIC URETERSCOPY, POSSIBLE BIOPSY   CYSTOSCOPY/URETEROSCOPY/HOLMIUM LASER/STENT PLACEMENT Right 05/11/2023   Procedure: CYSTOSCOPY/URETEROSCOPY/STENT  PLACEMENT;  Surgeon: Elisabeth Valli BIRCH, MD;  Location: [facility] OR;  Service: Urology;  Laterality: Right;  CYSTOSCOPY/RIGHT DIAGNOSTIC URETEROSCOPY/BIOPSY/HOLMIUM LASER/STENT EXCHANGE/RETROGRADE PYELOGRAM   IABP INSERTION N/A 08/25/2022   Procedure: IABP Insertion;  Surgeon: Cherrie Toribio SAUNDERS, MD;  Location: [facility] INVASIVE CV LAB;  Service: Cardiovascular;  Laterality: N/A;   ICD IMPLANT N/A 02/20/2020   Procedure: ICD IMPLANT;  Surgeon: Inocencio Soyla Lunger, MD;  Location: [facility] INVASIVE CV LAB;  Service: Cardiovascular;  Laterality: N/A;   INSERTION OF IMPLANTABLE LEFT VENTRICULAR ASSIST DEVICE N/A 08/29/2022   Procedure: INSERTION OF MEDTRONIC IMPLANTABLE LEFT VENTRICULAR ASSIST DEVICE;  Surgeon: Obadiah Coy, MD;  Location: [facility] OR;  Service: Open Heart Surgery;  Laterality: N/A;   IR CT HEAD LTD  07/10/2022   IR CT HEAD LTD  08/30/2022   IR PERCUTANEOUS ART THROMBECTOMY/INFUSION INTRACRANIAL INC DIAG ANGIO  07/10/2022   IR PERCUTANEOUS ART THROMBECTOMY/INFUSION INTRACRANIAL INC DIAG ANGIO  08/30/2022   IR US  GUIDE VASC ACCESS LEFT  08/30/2022   IR US  GUIDE VASC ACCESS RIGHT  07/10/2022   RADIOLOGY WITH ANESTHESIA N/A 07/10/2022   Procedure: RADIOLOGY WITH ANESTHESIA;  Surgeon: Radiologist, Medication, MD;  Location: [facility] OR;  Service: Radiology;  Laterality: N/A;   RADIOLOGY WITH ANESTHESIA N/A 08/30/2022   Procedure: IR WITH ANESTHESIA;  Surgeon: Dolphus Carrion, MD;  Location: [facility] OR;  Service: Radiology;  Laterality: N/A;   REMOVAL OF IMPELLA LEFT VENTRICULAR ASSIST DEVICE N/A 08/29/2022   Procedure: REMOVAL OF INTRA-AORTIC BALLON PUMP;  Surgeon: Obadiah Coy, MD;  Location: [facility] OR;  Service: Open Heart Surgery;  Laterality: N/A;   RIGHT HEART CATH N/A 07/14/2022  Procedure: RIGHT HEART CATH;  Surgeon: Cherrie Toribio SAUNDERS, MD;  Location: [facility] INVASIVE CV LAB;  Service: Cardiovascular;  Laterality: N/A;   RIGHT HEART CATH N/A 08/25/2022   Procedure: RIGHT HEART CATH;  Surgeon: Cherrie Toribio SAUNDERS, MD;  Location: [facility] INVASIVE CV LAB;  Service: Cardiovascular;  Laterality: N/A;   RIGHT HEART CATH N/A 09/12/2022   Procedure: RIGHT HEART CATH;  Surgeon: Rolan Ezra RAMAN, MD;  Location: [facility] INVASIVE CV LAB;  Service: Cardiovascular;  Laterality: N/A;   RIGHT/LEFT HEART CATH AND CORONARY ANGIOGRAPHY N/A 12/16/2019   Procedure: RIGHT/LEFT HEART CATH AND CORONARY ANGIOGRAPHY;  Surgeon: Jordan, Peter M, MD;  Location: [facility] INVASIVE CV LAB;  Service: Cardiovascular;  Laterality: N/A;   RIGHT/LEFT HEART CATH AND CORONARY ANGIOGRAPHY N/A 08/10/2022   Procedure: RIGHT/LEFT HEART CATH AND CORONARY ANGIOGRAPHY;  Surgeon: Cherrie Toribio SAUNDERS, MD;  Location: [facility] INVASIVE CV LAB;  Service: Cardiovascular;  Laterality: N/A;   TEE WITHOUT CARDIOVERSION N/A 08/29/2022   Procedure: TRANSESOPHAGEAL ECHOCARDIOGRAM;  Surgeon: Obadiah Coy, MD;  Location: [facility] OR;  Service: Open Heart Surgery;  Laterality: N/A;   TOOTH EXTRACTION N/A 08/22/2022   Procedure: DENTAL RESTORATION/EXTRACTIONS;  Surgeon: Sheryle Hamilton, DMD;  Location: [facility] OR;  Service: Oral Surgery;  Laterality: N/A;    Family History  Problem Relation Age of Onset   Heart disease Mother    Hypertension Mother    Hypertension Father    Heart disease Father    Diabetes Father    Stroke Sister        x3   Diabetes Sister       Social History   Socioeconomic History   Marital status: Divorced    Spouse name: Not on file   Number of children: 1   Years of education: Not on file   Highest education level: Not on file  Occupational History   Not on file  Tobacco Use   Smoking status: Former    Current packs/day: 0.00    Average packs/day: 1 pack/day for 33.0 years (33.0 ttl pk-yrs)    Types: Cigarettes    Start date: 51    Quit date: 2018    Years since quitting: 7.9    Passive exposure: Never   Smokeless tobacco: Never   Tobacco comments:    Quit smoking in 2008  Vaping Use   Vaping status: Never Used  Substance and Sexual  Activity   Alcohol  use: Not Currently   Drug use: Not Currently    Types: Marijuana    Comment: stopped using months ago d/t how it affected breathing   Sexual activity: Not Currently  Other Topics Concern   Not on file  Social History Narrative   1 daughter    Social Drivers of Health   Tobacco Use: Medium Risk (11/01/2023)   Patient History    Smoking Tobacco Use: Former    Smokeless Tobacco Use: Never    Passive Exposure: Never  Physicist, Medical Strain: Low Risk (07/12/2022)   Overall Financial Resource Strain (CARDIA)    Difficulty of Paying Living Expenses: Not hard at all  Food Insecurity: No Food Insecurity (08/01/2023)   Epic    Worried About Programme Researcher, Broadcasting/film/video in the Last Year: Never true    Ran Out of Food in the Last Year: Never true  Recent Concern: Food Insecurity - Food Insecurity Present (05/11/2023)   Hunger Vital Sign    Worried About Running Out of Food in the Last Year: Sometimes true  Ran Out of Food in the Last Year: Often true  Transportation Needs: No Transportation Needs (08/01/2023)   Epic    Lack of Transportation (Medical): No    Lack of Transportation (Non-Medical): No  Physical Activity: Not on file  Stress: Not on file  Social Connections: Not on file  Depression (PHQ2-9): Medium Risk (11/01/2023)   Depression (PHQ2-9)    PHQ-2 Score: 6  Alcohol  Screen: Not on file  Housing: Unknown (08/01/2023)   Epic    Unable to Pay for Housing in the Last Year: No    Number of Times Moved in the Last Year: Not on file    Homeless in the Last Year: No  Utilities: Not At Risk (08/01/2023)   Epic    Threatened with loss of utilities: No  Health Literacy: Not on file    Allergies[1]  Current Medications[2]    Review of Systems     Objective:   Physical Exam        Assessment & Plan:       [1]  Allergies Allergen Reactions   Pacerone  [Amiodarone ] Other (See Comments)    Severe tremors   Oxycodone  Itching    Pt knows as percocet   [2]  Current Outpatient Medications:    acetaminophen  (TYLENOL ) 325 MG tablet, Take 650 mg by mouth as needed (2-3 TABS AS NEEDED)., Disp: , Rfl:    amoxicillin  (AMOXIL ) 500 MG capsule, Take 2 capsules (1,000 mg total) by mouth 2 (two) times daily., Disp: 360 capsule, Rfl: 3   aspirin  EC 81 MG tablet, Take 1 tablet (81 mg total) by mouth daily. Swallow whole., Disp: 120 tablet, Rfl: 0   atorvastatin  (LIPITOR ) 80 MG tablet, Take 1 tablet (80 mg total) by mouth daily., Disp: 90 tablet, Rfl: 3   dorzolamide -timolol  (COSOPT ) 2-0.5 % ophthalmic solution, Place 1 drop into both eyes 2 (two) times daily. (Patient not taking: Reported on 11/28/2023), Disp: , Rfl:    fluticasone-salmeterol (ADVAIR  HFA) 230-21 MCG/ACT inhaler, Inhale 2 puffs into the lungs 2 (two) times daily., Disp: 1 each, Rfl: 12   latanoprost  (XALATAN ) 0.005 % ophthalmic solution, Place 1 drop into both eyes at bedtime. (Patient not taking: Reported on 11/28/2023), Disp: , Rfl:    melatonin 5 MG TABS, Take 5 mg by mouth at bedtime., Disp: , Rfl:    mexiletine (MEXITIL ) 250 MG capsule, TAKE 1 CAPSULE BY MOUTH 2 TIMES DAILY., Disp: 180 capsule, Rfl: 3   nystatin  cream (MYCOSTATIN ), Apply topically to the affected area 2 (two) times daily as needed (itching)., Disp: 30 g, Rfl: 3   pantoprazole  (PROTONIX ) 40 MG tablet, Take 1 tablet (40 mg total) by mouth daily before breakfast., Disp: 90 tablet, Rfl: 3   sertraline  (ZOLOFT ) 50 MG tablet, Take 1 tablet (50 mg total) by mouth daily., Disp: 90 tablet, Rfl: 3   sildenafil  (REVATIO ) 20 MG tablet, Take 1 tablet (20 mg total) by mouth 3 (three) times daily., Disp: 90 tablet, Rfl: 11   traZODone  (DESYREL ) 100 MG tablet, Take 100 mg by mouth at bedtime as needed., Disp: , Rfl:    warfarin (COUMADIN ) 3 MG tablet, Take 3 mg (1 tablet) by mouth daily or as directed by the advanced heart failure clinic, Disp: 180 tablet, Rfl: 3

## 2024-01-01 ENCOUNTER — Other Ambulatory Visit: Payer: Self-pay

## 2024-01-01 ENCOUNTER — Other Ambulatory Visit (HOSPITAL_BASED_OUTPATIENT_CLINIC_OR_DEPARTMENT_OTHER): Payer: Self-pay

## 2024-01-01 ENCOUNTER — Ambulatory Visit: Admitting: Infectious Disease

## 2024-01-01 ENCOUNTER — Encounter: Payer: Self-pay | Admitting: Infectious Disease

## 2024-01-01 VITALS — Temp 97.1°F | Ht 67.0 in | Wt 186.0 lb

## 2024-01-01 DIAGNOSIS — Z7185 Encounter for immunization safety counseling: Secondary | ICD-10-CM

## 2024-01-01 DIAGNOSIS — A498 Other bacterial infections of unspecified site: Secondary | ICD-10-CM

## 2024-01-01 DIAGNOSIS — F4321 Adjustment disorder with depressed mood: Secondary | ICD-10-CM

## 2024-01-01 DIAGNOSIS — T827XXA Infection and inflammatory reaction due to other cardiac and vascular devices, implants and grafts, initial encounter: Secondary | ICD-10-CM

## 2024-01-01 DIAGNOSIS — R42 Dizziness and giddiness: Secondary | ICD-10-CM

## 2024-01-01 DIAGNOSIS — Z95811 Presence of heart assist device: Secondary | ICD-10-CM

## 2024-01-01 DIAGNOSIS — I5023 Acute on chronic systolic (congestive) heart failure: Secondary | ICD-10-CM

## 2024-01-01 DIAGNOSIS — R5381 Other malaise: Secondary | ICD-10-CM

## 2024-01-01 MED ORDER — AMOXICILLIN 500 MG PO CAPS
1000.0000 mg | ORAL_CAPSULE | Freq: Two times a day (BID) | ORAL | 3 refills | Status: AC
  Filled 2024-01-01: qty 360, 90d supply, fill #0
  Filled ????-??-??: fill #0

## 2024-01-02 ENCOUNTER — Ambulatory Visit (HOSPITAL_COMMUNITY): Payer: Self-pay | Admitting: Pharmacist

## 2024-01-02 ENCOUNTER — Encounter (HOSPITAL_COMMUNITY): Admission: RE | Disposition: A | Payer: Self-pay | Attending: Internal Medicine

## 2024-01-02 ENCOUNTER — Ambulatory Visit (HOSPITAL_COMMUNITY)
Admission: RE | Admit: 2024-01-02 | Discharge: 2024-01-02 | Disposition: A | Discharge status: Home / Self Care | Attending: Internal Medicine | Admitting: Internal Medicine

## 2024-01-02 ENCOUNTER — Other Ambulatory Visit: Payer: Self-pay

## 2024-01-02 DIAGNOSIS — I509 Heart failure, unspecified: Secondary | ICD-10-CM

## 2024-01-02 DIAGNOSIS — Z95810 Presence of automatic (implantable) cardiac defibrillator: Secondary | ICD-10-CM | POA: Insufficient documentation

## 2024-01-02 DIAGNOSIS — I428 Other cardiomyopathies: Secondary | ICD-10-CM | POA: Insufficient documentation

## 2024-01-02 DIAGNOSIS — K219 Gastro-esophageal reflux disease without esophagitis: Secondary | ICD-10-CM | POA: Diagnosis not present

## 2024-01-02 DIAGNOSIS — Z95811 Presence of heart assist device: Secondary | ICD-10-CM | POA: Diagnosis not present

## 2024-01-02 DIAGNOSIS — Z8673 Personal history of transient ischemic attack (TIA), and cerebral infarction without residual deficits: Secondary | ICD-10-CM | POA: Insufficient documentation

## 2024-01-02 DIAGNOSIS — D8685 Sarcoid myocarditis: Secondary | ICD-10-CM | POA: Insufficient documentation

## 2024-01-02 DIAGNOSIS — I110 Hypertensive heart disease with heart failure: Secondary | ICD-10-CM | POA: Diagnosis present

## 2024-01-02 DIAGNOSIS — Z7901 Long term (current) use of anticoagulants: Secondary | ICD-10-CM | POA: Diagnosis not present

## 2024-01-02 DIAGNOSIS — I2510 Atherosclerotic heart disease of native coronary artery without angina pectoris: Secondary | ICD-10-CM | POA: Diagnosis not present

## 2024-01-02 DIAGNOSIS — I480 Paroxysmal atrial fibrillation: Secondary | ICD-10-CM | POA: Diagnosis not present

## 2024-01-02 DIAGNOSIS — I5022 Chronic systolic (congestive) heart failure: Secondary | ICD-10-CM | POA: Diagnosis not present

## 2024-01-02 DIAGNOSIS — Z79899 Other long term (current) drug therapy: Secondary | ICD-10-CM | POA: Insufficient documentation

## 2024-01-02 DIAGNOSIS — I5023 Acute on chronic systolic (congestive) heart failure: Secondary | ICD-10-CM

## 2024-01-02 HISTORY — PX: RIGHT HEART CATH: CATH118263

## 2024-01-02 LAB — CBC
HCT: 46.5 % (ref 39.0–52.0)
Hemoglobin: 15.1 g/dL (ref 13.0–17.0)
MCH: 32.2 pg (ref 26.0–34.0)
MCHC: 32.5 g/dL (ref 30.0–36.0)
MCV: 99.1 fL (ref 80.0–100.0)
Platelets: 125 K/uL — ABNORMAL LOW (ref 150–400)
RBC: 4.69 MIL/uL (ref 4.22–5.81)
RDW: 13.5 % (ref 11.5–15.5)
WBC: 4.8 K/uL (ref 4.0–10.5)
nRBC: 0 % (ref 0.0–0.2)

## 2024-01-02 LAB — BASIC METABOLIC PANEL WITH GFR
Anion gap: 6 (ref 5–15)
BUN: 12 mg/dL (ref 6–20)
CO2: 30 mmol/L (ref 22–32)
Calcium: 8.9 mg/dL (ref 8.9–10.3)
Chloride: 109 mmol/L (ref 98–111)
Creatinine, Ser: 1.09 mg/dL (ref 0.61–1.24)
GFR, Estimated: >60 mL/min (ref >60)
Glucose, Bld: 79 mg/dL (ref 70–99)
Potassium: 4.3 mmol/L (ref 3.5–5.1)
Sodium: 145 mmol/L (ref 135–145)

## 2024-01-02 LAB — POCT I-STAT EG7
Acid-Base Excess: 1 mmol/L (ref 0.0–2.0)
Acid-Base Excess: 1 mmol/L (ref 0.0–2.0)
Bicarbonate: 27.4 mmol/L (ref 20.0–28.0)
Bicarbonate: 27.7 mmol/L (ref 20.0–28.0)
Calcium, Ion: 1.21 mmol/L (ref 1.15–1.40)
Calcium, Ion: 1.24 mmol/L (ref 1.15–1.40)
HCT: 42 % (ref 39.0–52.0)
HCT: 43 % (ref 39.0–52.0)
Hemoglobin: 14.3 g/dL (ref 13.0–17.0)
Hemoglobin: 14.6 g/dL (ref 13.0–17.0)
O2 Saturation: 64 %
O2 Saturation: 66 %
Potassium: 4.1 mmol/L (ref 3.5–5.1)
Potassium: 4.2 mmol/L (ref 3.5–5.1)
Sodium: 146 mmol/L — ABNORMAL HIGH (ref 135–145)
Sodium: 147 mmol/L — ABNORMAL HIGH (ref 135–145)
TCO2: 29 mmol/L (ref 22–32)
TCO2: 29 mmol/L (ref 22–32)
pCO2, Ven: 51.6 mmHg (ref 44–60)
pCO2, Ven: 52.7 mmHg (ref 44–60)
pH, Ven: 7.328 (ref 7.25–7.43)
pH, Ven: 7.334 (ref 7.25–7.43)
pO2, Ven: 36 mmHg (ref 32–45)
pO2, Ven: 37 mmHg (ref 32–45)

## 2024-01-02 LAB — PROTIME-INR
INR: 1.9 — ABNORMAL HIGH (ref 0.8–1.2)
Prothrombin Time: 23 s — ABNORMAL HIGH (ref 11.4–15.2)

## 2024-01-02 SURGERY — RIGHT HEART CATH
Anesthesia: LOCAL

## 2024-01-02 MED ORDER — SODIUM CHLORIDE 0.9% FLUSH: 3.0000 mL | INTRAVENOUS | Status: DC | PRN

## 2024-01-02 MED ORDER — HEPARIN (PORCINE) IN NACL 1000-0.9 UT/500ML-% IV SOLN
INTRAVENOUS | Status: DC | PRN
  Administered 2024-01-02: 09:00:00 500 mL via SURGICAL_CAVITY

## 2024-01-02 MED ORDER — LIDOCAINE HCL (PF) 1 % IJ SOLN
INTRAMUSCULAR | Status: AC
  Filled 2024-01-02: qty 30

## 2024-01-02 MED ORDER — LABETALOL HCL 5 MG/ML IV SOLN: 10.0000 mg | INTRAVENOUS | Status: DC | PRN

## 2024-01-02 MED ORDER — SODIUM CHLORIDE 0.9% FLUSH: 3.0000 mL | Freq: Two times a day (BID) | INTRAVENOUS | Status: DC

## 2024-01-02 MED ORDER — FREE WATER: 250.0000 mL | Freq: Once | Status: DC

## 2024-01-02 MED ORDER — ONDANSETRON HCL 4 MG/2ML IJ SOLN: 4.0000 mg | Freq: Four times a day (QID) | INTRAMUSCULAR | Status: DC | PRN

## 2024-01-02 MED ORDER — SODIUM CHLORIDE 0.9 % IV SOLN: 250.0000 mL | INTRAVENOUS | Status: DC | PRN

## 2024-01-02 MED ORDER — HYDRALAZINE HCL 20 MG/ML IJ SOLN: 10.0000 mg | INTRAMUSCULAR | Status: DC | PRN

## 2024-01-02 MED ORDER — LIDOCAINE HCL (PF) 1 % IJ SOLN
INTRAMUSCULAR | Status: DC | PRN
  Administered 2024-01-02: 09:00:00 2 mL

## 2024-01-02 MED ORDER — ACETAMINOPHEN 325 MG PO TABS: 650.0000 mg | ORAL_TABLET | ORAL | Status: DC | PRN

## 2024-01-02 SURGICAL SUPPLY — 6 items
CATH SWAN GANZ 7FR S TIP (CATHETERS) IMPLANT
GLIDESHEATH SLENDER 7FR .021G (SHEATH) IMPLANT
GUIDEWIRE .025 260CM (WIRE) IMPLANT
PACK CARDIAC CATHETERIZATION (CUSTOM PROCEDURE TRAY) ×1 IMPLANT
TRANSDUCER W/STOPCOCK (MISCELLANEOUS) IMPLANT
TUBING ART PRESS 72 MALE/FEM (TUBING) IMPLANT

## 2024-01-02 NOTE — Progress Notes (Signed)
 VAD Coordinator Procedure Note:   VAD Coordinator met patient in cath lab room 5. Pt undergoing RHC per Dr. Bensimhon. Hemodynamics and VAD parameters monitored by myself and cath lab staff throughout the procedure. Blood pressures were obtained with automatic cuff on left arm.   Time: Auto  BP Flow PI Power Speed RA PA PCWP CO/CI Papi  Pre-procedure:  0830 110/91(99) 3.7 3.1 4.4 5700        0845 105/89(96) 3.4 2.4 4.3 5700       Intra-procedure: 0900 107/86(95) 3.7 2.8 4.5 5700 12 20/9 (14) 12 3.2/1.6 0.9               Post procedure 0944 124/79 (92) 3.4 2.8 4.2 5700                      Dr. Cherrie discussed RHC findings with pt at end of procedure. Plan to set patient up for home Milrinone  in the near future. Pt requests time to think about this and discuss with his family. Will continue conversation at next visit in VAD Clinic. Holley Herring to verify insurance coverage and accepting agencies.   Patient Disposition: No sedation given. Pt able to manage his own equipment in short stay.  Schuyler Lunger RN, BSN VAD Coordinator 24/7 Pager [phone]

## 2024-01-02 NOTE — Discharge Instructions (Signed)

## 2024-01-02 NOTE — H&P (Signed)
 LVAD CLINIC NOTE  PCP: Rothfuss, Lang DASEN, PA-C Cardiologist: Redell Leiter, MD HF MD:    HPI:  Mr Greenawalt is a 58 y.o. 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 Underwent HM- 3 LVAD implant on 08/13 + clipping left atrial appendage    Admitted to [facility] 11/21 with CP. Found to have VT and EF 25-30% mild AI, MR and TR. Transferred to [facility]. Cath showed 70% prox LAD with no intervention.    S/P CRT-D upgrade 06/08/21.   6/24, admitted w/ acute CVA due to right M1 occlusion treated w/ mechanical thrombectomy.   Echo showed LV markedly dilated EF < 20% and small effusion. RV ok. CVA felt to be cardioembolic.   Admitted in 7/24 for low-output HF. Angiography showed mild nonobstructive CAD. RHC hemodynamics c/w severe NICM w/ severely elevated filling pressures and low output state c/w cardiogenic shock. Repeat PFTs w/ severe obstructive defect (FEV1 1.04L, FEV1/FVC 48%) +response to bronchodilator.   Underwent LVAD implant on 08/13 + clipping left atrial appendage d/t severe thickening and invagination of mitral valve annulus impeding flows. Apical core sent to pathology - no mention of sarcoid.   Post VAD implant c/b left-sided hemiplegia. CT head 8/14 with acute R MCA infarct. Taken to IR for thrombectomy. F/u head CT with small hemorrhagic conversion. Had some residual deficits w/ mild left sided weakness but otherwise recovered. Course further c/b enterococcus faecalis bacteremia w/ 2/2 + cultures on 9/6. ID consulted. Recommended ampicillin  and ceftriaxone . PICC removed. Now on suppressive amoxicillin   Underwent cystoscopy/uretoscopy with ureteral stent placement on 04/16/23 for gross hematuria . R ureter and kidney not well visualized due to bleeding. S/p repeat cystoscopy/uteroscopy on 4/25. Stent removed by pt 05/23/23 per urology instructions. Recently had follow up with Dr Elisabeth. Biopsies from procedure were inconclusive.  Ramp echo  7/25  Speed increased to 5700   Due to ongoing dyspnea patient presents for RHC today    LVAD Documentation    11/28/2023  Device Info  LVAD Type: Heartmate III  Date of Implant: 08/29/2022  Therapy Type: Destination Therapy      11/28/2023  Vitals  Heart Rate: 70 BPM  Automatic BP: 93/82  Doppler MAP: 92 mmHg  SpO2: 94 %    Last 3 Weights Weight Weight  01/02/2024 85.276 kg 188 lb  01/01/2024 84.369 kg 186 lb  11/28/2023 84.55 kg 186 lb 6.4 oz       11/28/2023  LVAD Paramaters  Speed: 5700 RPM  Flow: 5 LPM  PI: 2  Power: 5 Watts  Hematocrit: 50 %  Alarms: none  Events: 20-40 daily  Last Speed Change Date: 08/16/2023  Last Ramp Echo Date: 08/16/2023  Last Right Heart Cath Date: 09/12/2022  Bleeding History: Yes  Type of Bleeding Hx: Other  Type of Bleeding Hx: hematuria  Type of Dressing: Weekly  Annual Maintenance Date: 02/29/2024    Labs    Units 01/02/24 0725 12/26/23 0000 12/19/23 0000 12/05/23 0000 11/28/23 0955 11/07/23 0000 11/01/23 1306 09/19/23 0000 09/13/23 1316 08/22/23 0000 08/16/23 1011  INR  1.9* 2.5 1.8*   < > 2.1*   < >  --    < > 1.8*   < > 1.6*  LDH U/L  --   --   --   --  225  --   --   --  263*  --  209*  HGB g/dL 84.8  --   --   --  16.6  --  17.5  --  16.0  --  15.8  CREATININE mg/dL 8.90  --   --   --  8.74*  --  1.02  --  0.46*  --  1.19   < > = values in this interval not displayed.        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   Stroke (HCC)    Systolic heart failure (HCC) 2021   LVEF 18%, RVEF 38% on cardiac MRI 12/19/2019. possible cardiac sarcoidosis.   Wide-complex tachycardia 2021   wears LifeVest    Current Facility-Administered Medications  Medication Dose Route Frequency Provider Last Rate Last Admin   0.9 %  sodium chloride  infusion  250 mL Intravenous PRN Stephonie Wilcoxen, Toribio SAUNDERS, MD       free water  250 mL  250 mL Oral Once Cyerra Yim, Toribio SAUNDERS, MD       Heparin  (Porcine) in NaCl 1000-0.9 UT/500ML-% SOLN    PRN Fulton Merry, Toribio SAUNDERS, MD   500 mL at 01/02/24 0855   sodium chloride  flush (NS) 0.9 % injection 3 mL  3 mL Intravenous Q12H Masayoshi Couzens, Toribio SAUNDERS, MD       sodium chloride  flush (NS) 0.9 % injection 3 mL  3 mL Intravenous PRN Senya Hinzman, Toribio SAUNDERS, MD        Pacerone  [amiodarone ] and Oxycodone  Vitals:   01/02/24 0721  BP: (!) 70/60  Resp: 17  Temp: 97.9 F (36.6 C)  Weight: 85.3 kg  Height: 5' 7 (1.702 m)       Physical Exam: General:  NAD.  HEENT: normal  Neck: supple. JVP not elevated.  Carotids 2+ bilat; no bruits. No lymphadenopathy or thryomegaly appreciated. Cor: LVAD hum.  Lungs: Clear. Abdomen: soft, nontender, non-distended. No hepatosplenomegaly. No bruits or masses. Good bowel sounds. Driveline site clean. Anchor in place.  Extremities: no cyanosis, clubbing, rash. Warm no edema  Neuro: alert & oriented x 3. No focal deficits. Moves all 4 without problem     ASSESSMENT AND PLAN:   1.  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 Presented with VT. LHC 70% LAD  - cMRI 12/21 concerning for sarcoid and EF 18%.  - PET 2/[facility] EF 25% + active sarcoid - Echo 08/26/20 EF < 20% severely dilated LV RV mildly decreased.  - Medtronic CRT-D upgrade in 06/08/21 - Echo 07/10/22: EF <20%, RV okay, mod pericardial effusion, mod MR/TR - 08/29/22 HM III LVAD implant + clipping LAA d/t severe thickening and invagination of mitral  valve annulus impeding flows. Core biopsy not c/w sarcoid  - Ramp echo done 08/16/23. VAD cannula well positioned. AoV not opening. Moderate RV dysfunction.  Speed increased 5500 to 5700 with minimal change in VAD parameters - CT chest 8/25 negative for EOGO + for chronic lung disease - Continues to report NYHA III symptoms but seems out of proportion to objective findings,  Suspect at least in part due to lung disease and deconditioning  - Continue CR - Remains symptomatic. RHC today   2. HM-3 LVAD - VAD interrogated personally. Parameters stable. - LDH 225 at last check - DL site ok (on suppressive amox) - MAPs ok - Hgb 16.1 - INR 1.9 Goal 1.5-2.0 with recent hematuria,.  Remains on ASA 81 mg daily (on for CVA). Discussed warfarin dosing with PharmD personally.  3. Gross hematuria - being followed by Urology - Underwent cystoscopy/uretoscopy with ureteral stent placement on 04/16/23 . R ureter and kidney not well visualized due to bleeding.  - Had repeat cystoscopy/uteroscopy on 4/25. Stent now out - Path inconclusive - Following with URology   4.  H/o stroke - Admitted 06/24 w/ R MCA stroke. S/p TPA and mechanical clot extraction. No residual deficits. Likely cardioembolic in setting of severe LV dysfunction. - Developed left sided weakness 08/14. CTA with R MCA infarct. Taken to IR for thrombectomy - Repeat CT head with small to moderate size hemorrhagic conversion.  - Recovered well with just mild LUE weakness. - Sgtable  5. Enterococcus faecalis bacteremia - Bcx 2/2 on 9/6 - ID consult 9/6 -> ampicillin  and ceftriaxone  - Echo 09/10 - no obvious vegetations - Completed 6 weeks of IV Ampicillin  and Ceftriaxone . Now on chronic suppression with Amoxicillin . - DL site looks good today   6. Hx VT - ln setting of potential sarcoid heart disease though core biopsy negative at time of VAD placement - Off amio due to tremor. Continue mexiletine  - VT quiescent - Labs ok    7.  CAD - LHC 12/07/19 70-% LAD, no intervention - LHC 8/24 non obstructive CAD.  - Continue statin. On aspirin  for VAD. - No s/s angina   8. 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  - holding methotrexate  w/ recent surgery and active infection, can discuss timing of restarting the medication at outpatient follow-up - apical core pathology not diagnostic of cardiac sarcoidosis.  - PET negative for sarcoid, Will not restart methotrexate  - No change   9. Paroxsymal AT/AF - Remains in NSR - Warfarin management as above   10. Pulmonary  - PFTs with severe obstructive defect, response to bronchodilator. FEV1 1.04L, FEV1/FVC 48% - Encouraged Pulmonary f/u with increased SOB - Suspect this is main source of dyspnea - No change  Toribio Fuel, MD  8:56 AM

## 2024-01-02 NOTE — Progress Notes (Signed)
 Discharges instructions reviewed with patient at the bedside denies questions or concerns. PT tolerated PO intake. Ambulated in the hallway was able to void without difficulty. PT ambulated from the unit to personal vehicle. Reports driving himself home d/t received no sedation dung the procedure.

## 2024-01-03 ENCOUNTER — Encounter (HOSPITAL_COMMUNITY): Payer: Self-pay | Admitting: Internal Medicine

## 2024-01-06 LAB — POCT INR: INR: 1.4 — AB (ref 2.0–3.0)

## 2024-01-07 ENCOUNTER — Other Ambulatory Visit (HOSPITAL_BASED_OUTPATIENT_CLINIC_OR_DEPARTMENT_OTHER): Payer: Self-pay

## 2024-01-07 ENCOUNTER — Other Ambulatory Visit (HOSPITAL_COMMUNITY): Payer: Self-pay | Admitting: Internal Medicine

## 2024-01-07 ENCOUNTER — Ambulatory Visit (HOSPITAL_COMMUNITY): Payer: Self-pay | Admitting: Pharmacist

## 2024-01-07 DIAGNOSIS — Z95811 Presence of heart assist device: Secondary | ICD-10-CM

## 2024-01-07 MED FILL — Mexiletine HCl Cap 250 MG: ORAL | 90 days supply | Qty: 180 | Fill #0 | Status: AC

## 2024-01-09 ENCOUNTER — Ambulatory Visit (HOSPITAL_COMMUNITY): Payer: Self-pay | Admitting: Pharmacist

## 2024-01-09 DIAGNOSIS — Z95811 Presence of heart assist device: Secondary | ICD-10-CM

## 2024-01-09 LAB — POCT INR: INR: 2.1 (ref 2.0–3.0)

## 2024-01-15 ENCOUNTER — Other Ambulatory Visit (HOSPITAL_COMMUNITY): Payer: Self-pay

## 2024-01-15 ENCOUNTER — Other Ambulatory Visit (HOSPITAL_BASED_OUTPATIENT_CLINIC_OR_DEPARTMENT_OTHER): Payer: Self-pay

## 2024-01-15 MED ORDER — TRAZODONE HCL 100 MG PO TABS
100.0000 mg | ORAL_TABLET | Freq: Every evening | ORAL | 3 refills | Status: AC | PRN
  Filled 2024-01-15: qty 30, 30d supply, fill #0

## 2024-01-16 ENCOUNTER — Ambulatory Visit (HOSPITAL_COMMUNITY): Payer: Self-pay | Admitting: Pharmacist

## 2024-01-16 DIAGNOSIS — Z95811 Presence of heart assist device: Secondary | ICD-10-CM

## 2024-01-16 LAB — POCT INR: INR: 2.1 (ref 2.0–3.0)

## 2024-01-18 ENCOUNTER — Other Ambulatory Visit (HOSPITAL_COMMUNITY): Payer: Self-pay

## 2024-01-18 ENCOUNTER — Other Ambulatory Visit (HOSPITAL_BASED_OUTPATIENT_CLINIC_OR_DEPARTMENT_OTHER): Payer: Self-pay

## 2024-01-18 DIAGNOSIS — Z7901 Long term (current) use of anticoagulants: Secondary | ICD-10-CM

## 2024-01-18 DIAGNOSIS — Z95811 Presence of heart assist device: Secondary | ICD-10-CM

## 2024-01-21 ENCOUNTER — Other Ambulatory Visit (HOSPITAL_COMMUNITY): Payer: Self-pay | Admitting: *Deleted

## 2024-01-21 ENCOUNTER — Ambulatory Visit (HOSPITAL_COMMUNITY)
Admit: 2024-01-21 | Discharge: 2024-01-21 | Disposition: A | Discharge status: Home / Self Care | Attending: Internal Medicine | Admitting: Internal Medicine

## 2024-01-21 VITALS — BP 90/0 | HR 71 | Wt 189.0 lb

## 2024-01-21 DIAGNOSIS — I480 Paroxysmal atrial fibrillation: Secondary | ICD-10-CM | POA: Insufficient documentation

## 2024-01-21 DIAGNOSIS — I2510 Atherosclerotic heart disease of native coronary artery without angina pectoris: Secondary | ICD-10-CM | POA: Diagnosis not present

## 2024-01-21 DIAGNOSIS — Z792 Long term (current) use of antibiotics: Secondary | ICD-10-CM | POA: Insufficient documentation

## 2024-01-21 DIAGNOSIS — R570 Cardiogenic shock: Secondary | ICD-10-CM | POA: Insufficient documentation

## 2024-01-21 DIAGNOSIS — R310 Gross hematuria: Secondary | ICD-10-CM | POA: Insufficient documentation

## 2024-01-21 DIAGNOSIS — I69354 Hemiplegia and hemiparesis following cerebral infarction affecting left non-dominant side: Secondary | ICD-10-CM | POA: Insufficient documentation

## 2024-01-21 DIAGNOSIS — I428 Other cardiomyopathies: Secondary | ICD-10-CM | POA: Diagnosis not present

## 2024-01-21 DIAGNOSIS — B952 Enterococcus as the cause of diseases classified elsewhere: Secondary | ICD-10-CM | POA: Insufficient documentation

## 2024-01-21 DIAGNOSIS — Z95811 Presence of heart assist device: Secondary | ICD-10-CM

## 2024-01-21 DIAGNOSIS — K219 Gastro-esophageal reflux disease without esophagitis: Secondary | ICD-10-CM | POA: Diagnosis not present

## 2024-01-21 DIAGNOSIS — R251 Tremor, unspecified: Secondary | ICD-10-CM | POA: Insufficient documentation

## 2024-01-21 DIAGNOSIS — Z7982 Long term (current) use of aspirin: Secondary | ICD-10-CM | POA: Insufficient documentation

## 2024-01-21 DIAGNOSIS — Z4509 Encounter for adjustment and management of other cardiac device: Secondary | ICD-10-CM | POA: Diagnosis not present

## 2024-01-21 DIAGNOSIS — Z79899 Other long term (current) drug therapy: Secondary | ICD-10-CM | POA: Diagnosis not present

## 2024-01-21 DIAGNOSIS — I5022 Chronic systolic (congestive) heart failure: Secondary | ICD-10-CM | POA: Diagnosis not present

## 2024-01-21 DIAGNOSIS — I472 Ventricular tachycardia, unspecified: Secondary | ICD-10-CM | POA: Diagnosis not present

## 2024-01-21 DIAGNOSIS — Z7901 Long term (current) use of anticoagulants: Secondary | ICD-10-CM | POA: Insufficient documentation

## 2024-01-21 DIAGNOSIS — J449 Chronic obstructive pulmonary disease, unspecified: Secondary | ICD-10-CM | POA: Insufficient documentation

## 2024-01-21 DIAGNOSIS — R319 Hematuria, unspecified: Secondary | ICD-10-CM | POA: Diagnosis not present

## 2024-01-21 DIAGNOSIS — I110 Hypertensive heart disease with heart failure: Secondary | ICD-10-CM | POA: Insufficient documentation

## 2024-01-21 LAB — COMPREHENSIVE METABOLIC PANEL WITH GFR
ALT: 29 U/L (ref 0–44)
AST: 37 U/L (ref 15–41)
Albumin: 4.5 g/dL (ref 3.5–5.0)
Alkaline Phosphatase: 117 U/L (ref 38–126)
Anion gap: 8 (ref 5–15)
BUN: 13 mg/dL (ref 6–20)
CO2: 29 mmol/L (ref 22–32)
Calcium: 9.4 mg/dL (ref 8.9–10.3)
Chloride: 105 mmol/L (ref 98–111)
Creatinine, Ser: 1.09 mg/dL (ref 0.61–1.24)
GFR, Estimated: >60 mL/min
Glucose, Bld: 81 mg/dL (ref 70–99)
Potassium: 4.6 mmol/L (ref 3.5–5.1)
Sodium: 142 mmol/L (ref 135–145)
Total Bilirubin: 1.8 mg/dL — ABNORMAL HIGH (ref 0.0–1.2)
Total Protein: 7 g/dL (ref 6.5–8.1)

## 2024-01-21 LAB — CBC
HCT: 52.1 % — ABNORMAL HIGH (ref 39.0–52.0)
Hemoglobin: 17.3 g/dL — ABNORMAL HIGH (ref 13.0–17.0)
MCH: 33.1 pg (ref 26.0–34.0)
MCHC: 33.2 g/dL (ref 30.0–36.0)
MCV: 99.8 fL (ref 80.0–100.0)
Platelets: 160 K/uL (ref 150–400)
RBC: 5.22 MIL/uL (ref 4.22–5.81)
RDW: 13.4 % (ref 11.5–15.5)
WBC: 6.1 K/uL (ref 4.0–10.5)
nRBC: 0 % (ref 0.0–0.2)

## 2024-01-21 LAB — LACTATE DEHYDROGENASE: LDH: 350 U/L — ABNORMAL HIGH (ref 105–235)

## 2024-01-21 NOTE — Patient Instructions (Addendum)
 No medication changes today Coumadin  dosing per Lauren PharmD Return to clinic in 2 months for follow up with Dr Bensimhon. We will call you to schedule follow up appt when we have provider schedule Please call PCP to discuss right ear pain Please call urology office to schedule urogram

## 2024-01-21 NOTE — Progress Notes (Addendum)
 Pt presents for 2 month f/u in VAD Clinic today alone. Reports no problems with VAD equipment or concerns with drive line.   Pt continues with generalized fatigue. Currently enrolled in Cardiac Rehab at [facility]- but has put this on hold for now. Denies lightheadedness, dizziness, falls, shortness of breath, and signs of bleeding, other than minor nosebleeds. Reports intermittent daily headache with right ear pain. Discussed with Dr Cherrie. Advised to call PCP for evaluation. He verbalized understanding.   Discussed RHC results with Dr Cherrie. Discussed probable need for IV inotrope initiation in the future. Pt does not want to start inotrope at this time. States he does not want to have another device to carry around. States he will notify VAD coordinator if symptoms worsen to discuss inotrope again. Per Dr Cherrie pt to resume cardiac rehab.   Pt states he continues to have intermittent hematuria- none recently per patient. He last saw Urology in November. Needs CT urogram per urology- pt has not scheduled this yet. Advised to call urology office to follow up on this. He verbalized understanding. S/p cystoscopy/uteroscopy on 05/11/23. Stent removed by pt 05/23/23 per urology instructions. Biopsies from procedure were inconclusive.   Patient remains on chronic suppressive Amoxicillin  1000 mg BID for previous Enterococcus Faecalis bacteremia.   Vital Signs:  Doppler Pressure: 90 Automatic BP: 98/79 (87) HR: 71 SR SPO2: UTO% RA   Weight: 189.0 lb w/ eqt Discharge weight: 186.4 lb w/ eqt   VAD Indication: Destination Therapy - sarcoid   LVAD assessment: HM III VAD Speed: 5700 Flow: 4.4 Power: 4.6w PI: 2.4  Alarms: none Events: 60+ daily   Fixed speed: 5700 Low speed limit: 5400   Primary controller: back up battery due for replacement in 14 months Secondary controller: back up battery due for replacement in 23 months   I reviewed the LVAD parameters from today and compared the  results to the patient's prior recorded data. LVAD interrogation was NEGATIVE for significant power changes, NEGATIVE for clinical alarms and STABLE for PI events/speed drops. No programming changes were made and pump is functioning within specified parameters. Pt is performing daily controller and system monitor self tests along with completing weekly and monthly maintenance for LVAD equipment.   LVAD equipment check completed and is in good working order. Back-up equipment not present at today's visit.   Annual Equipment Maintenance on UBC/PM was performed on 08/02/23.  Exit Site Care:  Existing VAD dressing clean, dry, and intact. Anchor correctly applied. Improvement in skin irrigation related to adhesive from anchor. Pt states he has enough dressing kits at home.    Significant Events on VAD Support:  08/30/22: Acute infarct seen on the right temporal cortex and basal ganglia    Device: Medtronic Therapies: ON Last check: 06/13/22   BP & Labs:  MAP 90 - Doppler is reflecting modified systolic   Hgb 17.3 - No S/S of bleeding. Specifically denies melena/BRBPR or nosebleeds.   LDH hemolyzed with established baseline of 180- 245. Denies tea-colored urine. No power elevations noted on interrogation.    Plan:  No medication changes today Coumadin  dosing per Lauren PharmD Return to clinic in 2 months for follow up with Dr Bensimhon. We will call you to schedule follow up appt when we have provider schedule Please call PCP to discuss right ear pain Please call urology office to schedule urogram  Isaiah Knoll RN VAD Coordinator  Office: [phone]  24/7 Pager: [phone]

## 2024-01-21 NOTE — Progress Notes (Addendum)
 "  LVAD CLINIC NOTE  PCP: Rothfuss, Lang DASEN, PA-C Cardiologist: Redell Leiter, MD HF MD:   Chief complaint: HF/SOB  HPI:  Mr Mcguiness is a 59 y.o. 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 Underwent HM- 3 LVAD implant on 08/13 + clipping left atrial appendage    Admitted to [facility] 11/21 with CP. Found to have VT and EF 25-30% mild AI, MR and TR. Transferred to [facility]. Cath showed 70% prox LAD with no intervention.    S/P CRT-D upgrade 06/08/21.   6/24, admitted w/ acute CVA due to right M1 occlusion treated w/ mechanical thrombectomy.   Echo showed LV markedly dilated EF < 20% and small effusion. RV ok. CVA felt to be cardioembolic.   Admitted in 7/24 for low-output HF. Angiography showed mild nonobstructive CAD. RHC hemodynamics c/w severe NICM w/ severely elevated filling pressures and low output state c/w cardiogenic shock. Repeat PFTs w/ severe obstructive defect (FEV1 1.04L, FEV1/FVC 48%) +response to bronchodilator.   Underwent LVAD implant on 08/13 + clipping left atrial appendage d/t severe thickening and invagination of mitral valve annulus impeding flows. Apical core sent to pathology - no mention of sarcoid.   Post VAD implant c/b left-sided hemiplegia. CT head 8/14 with acute R MCA infarct. Taken to IR for thrombectomy. F/u head CT with small hemorrhagic conversion. Had some residual deficits w/ mild left sided weakness but otherwise recovered. Course further c/b enterococcus faecalis bacteremia w/ 2/2 + cultures on 9/6. ID consulted. Recommended ampicillin  and ceftriaxone . PICC removed. Now on suppressive amoxicillin   Underwent cystoscopy/uretoscopy with ureteral stent placement on 04/16/23 for gross hematuria . R ureter and kidney not well visualized due to bleeding. S/p repeat cystoscopy/uteroscopy on 4/25. Stent removed by pt 05/23/23 per urology instructions. Recently had follow up with Dr Elisabeth. Biopsies from procedure were  inconclusive.  Ramp echo 7/25  Speed increased to 5700   Underwent RHC 01/02/24 which showed low output RV failure. Suggested milrinone  but he deferred   RA = 12 RV = 20/11 PA = 20/9 (14) PCW = 12 Fick cardiac output/index = 4.9/2.5 Thermo CO/CUI = 3.2/1.6 PVR = 0.4 WU (Fick) 0.6 WU (TD) Ao sat = 91% PA sat = 65%, 65% PAPi = 0.9  Here for post cath f/u. Continues to feel fatigued. Can do ADLS but struggles with more. Denies orthopnea or PND. No fevers, chills or problems with driveline. No bleeding, melena or neuro symptoms. No VAD alarms. Taking all meds as prescribed.      LVAD Documentation    01/21/2024  Device Info  LVAD Type: Heartmate III  Date of Implant: 08/29/2022  Therapy Type: Destination Therapy      01/21/2024  Vitals  Heart Rate: 71 BPM  Automatic BP: 98/79  Doppler MAP: 90 mmHg  SpO2: --    Last 3 Weights Weight Weight  01/21/2024 85.73 kg 189 lb  01/02/2024 85.276 kg 188 lb  01/01/2024 84.369 kg 186 lb       01/21/2024  LVAD Paramaters  Speed: 5700 RPM  Flow: 4 LPM  PI: 2  Power: 5 Watts  Hematocrit: 51 %  Alarms: none  Events: 60+ daily  Last Speed Change Date: 08/16/2023  Last Ramp Echo Date: 08/16/2023  Last Right Heart Cath Date: 01/02/2024  Bleeding History: Yes  Type of Bleeding Hx: Other  Type of Bleeding Hx: hematuria  Type of Dressing: Weekly  Annual Maintenance Date: 02/29/2024    Labs  Units 01/21/24 0917 01/16/24 0000 01/09/24 0000 01/06/24 0000 01/02/24 0913 01/02/24 0725 12/05/23 0000 11/28/23 0955 09/19/23 0000 09/13/23 1316  INR   --  2.1 2.1 1.4*  --  1.9*   < > 2.1*   < > 1.8*  LDH U/L 350*  --   --   --   --   --   --  225  --  263*  HGB g/dL 82.6*  --   --   --  85.6  14.6 15.1  --  16.6   < > 16.0  CREATININE mg/dL 8.90  --   --   --   --  1.09  --  1.25*   < > 0.46*   < > = values in this interval not displayed.        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   Stroke (HCC)    Systolic heart failure (HCC) 2021   LVEF 18%, RVEF 38% on cardiac MRI 12/19/2019. possible cardiac sarcoidosis.   Wide-complex tachycardia 2021   wears LifeVest    Current Outpatient Medications  Medication Sig Dispense Refill   acetaminophen  (TYLENOL ) 325 MG tablet Take 650 mg by mouth as needed (2-3 TABS AS NEEDED).     amoxicillin  (AMOXIL ) 500 MG capsule Take 2 capsules (1,000 mg total) by mouth 2 (two) times daily. 360 capsule 3   aspirin  EC 81 MG tablet Take 1 tablet (81 mg total) by mouth daily. Swallow whole. 120 tablet 0   atorvastatin  (LIPITOR ) 80 MG tablet Take 1 tablet (80 mg total) by mouth daily. 90 tablet 3   fluticasone -salmeterol (ADVAIR  HFA) 230-21 MCG/ACT inhaler Inhale 2 puffs into the lungs 2 (two) times daily. 1 each 12   mexiletine (MEXITIL ) 250 MG capsule Take 1 capsule (250 mg total) by mouth 2 (two) times daily. 180 capsule 0   nystatin  cream (MYCOSTATIN ) Apply topically to the affected area 2 (two) times daily as needed (itching). 30 g 3   pantoprazole  (PROTONIX ) 40 MG tablet Take 1 tablet (40 mg total) by mouth daily before breakfast. 90 tablet 3   sertraline  (ZOLOFT ) 50 MG tablet Take 1 tablet (50 mg total) by mouth daily. 90 tablet 3   sildenafil  (REVATIO ) 20 MG tablet Take 1 tablet (20 mg total) by mouth 3 (three) times daily. 90 tablet 11   traZODone  (DESYREL ) 100 MG tablet Take 1 tablet (100 mg total) by mouth at bedtime as needed. 30 tablet 3    warfarin (COUMADIN ) 3 MG tablet Take 3 mg (1 tablet) by mouth daily or as directed by the advanced heart failure clinic 180 tablet 3   dorzolamide -timolol  (COSOPT ) 2-0.5 % ophthalmic solution Place 1 drop into both eyes 2 (two) times daily. (Patient not taking: Reported on 01/21/2024)     latanoprost  (XALATAN ) 0.005 % ophthalmic solution Place 1 drop into both eyes at bedtime. (Patient not taking: Reported on 01/21/2024)  melatonin 5 MG TABS Take 5 mg by mouth at bedtime. (Patient not taking: Reported on 01/21/2024)     No current facility-administered medications for this encounter.    Pacerone  [amiodarone ] and Oxycodone  Vitals:   01/21/24 0923 01/21/24 0933  BP: 98/79 (!) 90/0  Pulse: 71   Weight: 85.7 kg (189 lb)     Physical Exam: General:  NAD.  HEENT: normal  Neck: supple. JVP not elevated.  Carotids 2+ bilat; no bruits. No lymphadenopathy or thryomegaly appreciated. Cor: LVAD hum.  Lungs: Clear. Abdomen: obese soft, nontender, non-distended. No hepatosplenomegaly. No bruits or masses. Good bowel sounds. Driveline site clean. Anchor in place.  Extremities: no cyanosis, clubbing, rash. Warm no edema  Neuro: alert & oriented x 3. No focal deficits. Moves all 4 without problem    ASSESSMENT AND PLAN:   1.  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 Presented with VT. LHC 70% LAD  - cMRI 12/21 concerning for sarcoid and EF 18%.  - PET 2/22 at [facility] EF 25% + active sarcoid - Echo 08/26/20 EF < 20% severely dilated LV RV mildly decreased.  - [employer] CRT-D upgrade in 06/08/21 - Echo 07/10/22: EF <20%, RV okay, mod pericardial effusion, mod MR/TR - 08/29/22 HM III LVAD implant + clipping LAA d/t severe thickening and invagination of mitral valve annulus impeding flows. Core biopsy not c/w sarcoid  - Ramp echo done 08/16/23. VAD cannula well positioned. AoV not opening. Moderate RV dysfunction.  Speed increased 5500 to 5700 with minimal change in VAD parameters - CT  chest 8/25 negative for EOGO + for chronic lung disease - RHC 12/25 with evidence of RV failure.  - Remains NYHA III-IIIB. RHC suggestive of RV failure. COPD likely contributing as well. We discussed trial of home milrinone  but he does not want to proceed with this at this point. Will continue to follow. Wants to go back to [facility] and try to optimize. Ok to return  2. HM-3 LVAD - VAD interrogated personally. Parameters stable. - LDH 350 - DL site ok (on suppressive amox) - MAPs ok - Hgb 17.3 - INR pending Goal 1.5-2.0 with recent hematuria,.  Remains on ASA 81 mg daily (on for CVA). Discussed warfarin dosing with PharmD personally.  3. Gross hematuria - being followed by Urology - Underwent cystoscopy/uretoscopy with ureteral stent placement on 04/16/23 . R ureter and kidney not well visualized due to bleeding.  - Had repeat cystoscopy/uteroscopy on 4/25. Stent now out - Path inconclusive - Now quiescent. Following with Urology. Pending CT urogram.    4.  H/o stroke - Admitted 06/24 w/ R MCA stroke. S/p TPA and mechanical clot extraction. No residual deficits. Likely cardioembolic in setting of severe LV dysfunction. - Developed left sided weakness 08/14. CTA with R MCA infarct. Taken to IR for thrombectomy - Repeat CT head with small to moderate size hemorrhagic conversion.  - Recovered well with just mild LUE weakness. - Stable  5. Enterococcus faecalis bacteremia - Bcx 2/2 on 9/6 - ID consult 9/6 -> ampicillin  and ceftriaxone  - Echo 09/10 - no obvious vegetations - Completed 6 weeks of IV Ampicillin  and Ceftriaxone . Now on chronic suppression with Amoxicillin . - DL site ok   6. Hx VT - ln setting of potential sarcoid heart disease though core biopsy negative at time of VAD placement - Off amio due to tremor. Continue mexiletine  - VT quiescent. Labs ok    7. CAD - LHC 12/07/19 70-% LAD, no intervention - LHC  8/24 non obstructive CAD.  - Continue statin. On aspirin  for VAD. -  No s/s angina   8. 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  - holding methotrexate  w/ recent surgery and active infection, can discuss timing of restarting the medication at outpatient follow-up - apical core pathology not diagnostic of cardiac sarcoidosis.  - PET negative for sarcoid, Will not restart methotrexate  - No change   9. Paroxsymal AT/AF - Remains in NSR - INR management as above   10. Pulmonary  - PFTs with severe obstructive defect, response to bronchodilator. FEV1 1.04L, FEV1/FVC 48% - Follows with Pulmonary   Toribio Fuel, MD  9:43 AM   "

## 2024-01-23 ENCOUNTER — Ambulatory Visit (HOSPITAL_COMMUNITY): Payer: Self-pay | Admitting: Pharmacist

## 2024-01-23 LAB — POCT INR: INR: 2.9 (ref 2.0–3.0)

## 2024-01-24 ENCOUNTER — Ambulatory Visit (INDEPENDENT_AMBULATORY_CARE_PROVIDER_SITE_OTHER): Admitting: Student

## 2024-01-24 ENCOUNTER — Other Ambulatory Visit (HOSPITAL_BASED_OUTPATIENT_CLINIC_OR_DEPARTMENT_OTHER): Payer: Self-pay

## 2024-01-24 ENCOUNTER — Encounter (HOSPITAL_BASED_OUTPATIENT_CLINIC_OR_DEPARTMENT_OTHER): Payer: Self-pay | Admitting: Student

## 2024-01-24 VITALS — HR 30 | Temp 97.9°F | Resp 16 | Ht 67.0 in | Wt 187.4 lb

## 2024-01-24 DIAGNOSIS — R519 Headache, unspecified: Secondary | ICD-10-CM | POA: Diagnosis not present

## 2024-01-24 DIAGNOSIS — H6991 Unspecified Eustachian tube disorder, right ear: Secondary | ICD-10-CM

## 2024-01-24 MED ORDER — FLUTICASONE PROPIONATE 50 MCG/ACT NA SUSP
2.0000 | Freq: Every day | NASAL | 6 refills | Status: AC
  Filled 2024-01-24: qty 16, 30d supply, fill #0
  Filled 2024-02-18: qty 16, 30d supply, fill #1

## 2024-01-24 NOTE — Patient Instructions (Signed)
 It was nice to see you today!  If you have any problems before your next visit feel free to message me via MyChart (minor issues or questions) or call the office, otherwise you may reach out to schedule an office visit.  Thank you! Pau Banh, PA-C

## 2024-01-24 NOTE — Progress Notes (Unsigned)
 "  Acute Office Visit  Subjective:     Patient ID: Charles Holmes, male    DOB: 1965-02-08, 59 y.o.   MRN: [id number]  Chief Complaint  Patient presents with   Pain    Ear ache, Headache, and neck are bothering him for 2 weeks now. Losing sleep at night even worse. Head feels sore to touch when washing his hair. Has had a runny nose. No fever, nausea, or diarrhea. Tylenol  is not helping headache.    HPI  Discussed the use of AI scribe software for clinical note transcription with the patient, who gave verbal consent to proceed.  History of Present Illness   Charles Holmes is a 59 year old male who presents with ear pain and headaches.  He has been experiencing ear pain for about two weeks, describing it as deep within the ear with occasional sharp, shooting pains lasting for a second. He has not used Flonase  before.  He experiences daily headaches, described as tension headaches, with pain starting in the head and radiating down the neck. The headaches are often accompanied by neck pain, which feels tight and sometimes swollen. He is sensitive to loud sounds during these headaches, which often occur while using his phone before bed.  He reports a runny nose primarily in the mornings, occasionally leading to minor epistaxis. Tylenol  has not been effective in alleviating his symptoms.  He has a history of right-sided heart issues, with previous blood pressure readings at the LVAD clinic around 90. He reports feeling sleepy often.  His mother had severe headaches before she passed away, and he expresses concern about his own headaches.      ROS Per HPI     Objective:    Pulse (!) 30   Temp 97.9 F (36.6 C) (Oral)   Resp 16   Ht 5' 7 (1.702 m)   Wt 187 lb 6.4 oz (85 kg)   SpO2 95%   BMI 29.35 kg/m    Physical Exam Constitutional:      General: He is not in acute distress.    Appearance: Normal appearance. He is not ill-appearing.  HENT:     Head: Normocephalic  and atraumatic.     Right Ear: Tympanic membrane, ear canal and external ear normal.     Left Ear: Tympanic membrane, ear canal and external ear normal.     Nose: Nose normal.  Eyes:     Conjunctiva/sclera: Conjunctivae normal.  Pulmonary:     Effort: Pulmonary effort is normal. No respiratory distress.     Breath sounds: Normal breath sounds. No wheezing, rhonchi or rales.  Skin:    General: Skin is warm and dry.     Coloration: Skin is not jaundiced or pale.  Neurological:     Mental Status: He is alert.  Psychiatric:        Mood and Affect: Mood normal.        Behavior: Behavior normal.     No results found for any visits on 01/24/24.      Assessment & Plan:   Assessment and Plan    Right eustachian tube dysfunction Grossly normal otic exam. Possible eustachian tube dysfunction causing sharp shooting pain lasting for a second. No significant ear infection observed, but fluid status is uncertain due to inability to see the cone of light reflex. - Start Flonase  nasal spray, two sprays in each nostril twice daily for one week, then reduce to once daily. - Monitor symptoms and  report if no improvement after one week for potential antibiotic coverage.  Headache Chronic headaches, possibly tension-type or migrainous, with tightness and pain radiating to the neck. Occipital neuralgia considered due to shooting pain. No high-risk features or nerve disease. Possible migraine or migrainous-type headache due to sensitivity to sound and light. - Monitor headache symptoms and avoid overuse of Tylenol . - Consider occipital neuralgia if headaches persist or worsen. - Follow up if headaches do not improve with current management.      Return if symptoms worsen or fail to improve.  Sherley Leser T Jaelyn Cloninger, PA-C  "

## 2024-01-26 DIAGNOSIS — H6991 Unspecified Eustachian tube disorder, right ear: Secondary | ICD-10-CM | POA: Insufficient documentation

## 2024-01-30 ENCOUNTER — Ambulatory Visit (HOSPITAL_COMMUNITY): Payer: Self-pay | Admitting: Pharmacist

## 2024-01-30 LAB — POCT INR: INR: 1.5 — AB (ref 2.0–3.0)

## 2024-02-01 ENCOUNTER — Other Ambulatory Visit (HOSPITAL_BASED_OUTPATIENT_CLINIC_OR_DEPARTMENT_OTHER): Payer: Self-pay

## 2024-02-05 ENCOUNTER — Ambulatory Visit (HOSPITAL_COMMUNITY): Admitting: Internal Medicine

## 2024-02-06 ENCOUNTER — Ambulatory Visit (HOSPITAL_COMMUNITY): Payer: Self-pay | Admitting: Pharmacist

## 2024-02-06 LAB — POCT INR: INR: 1.7 — AB (ref 2.0–3.0)

## 2024-02-07 ENCOUNTER — Telehealth (HOSPITAL_COMMUNITY): Payer: Self-pay | Admitting: Unknown Physician Specialty

## 2024-02-07 NOTE — Telephone Encounter (Signed)

## 2024-02-08 ENCOUNTER — Other Ambulatory Visit (HOSPITAL_COMMUNITY): Payer: Self-pay | Admitting: *Deleted

## 2024-02-08 ENCOUNTER — Other Ambulatory Visit (HOSPITAL_BASED_OUTPATIENT_CLINIC_OR_DEPARTMENT_OTHER): Payer: Self-pay

## 2024-02-08 DIAGNOSIS — Z95811 Presence of heart assist device: Secondary | ICD-10-CM

## 2024-02-08 DIAGNOSIS — I50810 Right heart failure, unspecified: Secondary | ICD-10-CM

## 2024-02-08 DIAGNOSIS — I5022 Chronic systolic (congestive) heart failure: Secondary | ICD-10-CM

## 2024-02-08 MED ORDER — SILDENAFIL CITRATE 20 MG PO TABS
20.0000 mg | ORAL_TABLET | Freq: Three times a day (TID) | ORAL | 11 refills | Status: AC
  Filled 2024-02-08: qty 90, 30d supply, fill #0

## 2024-02-13 ENCOUNTER — Other Ambulatory Visit (HOSPITAL_BASED_OUTPATIENT_CLINIC_OR_DEPARTMENT_OTHER): Payer: Self-pay

## 2024-02-18 ENCOUNTER — Other Ambulatory Visit (HOSPITAL_BASED_OUTPATIENT_CLINIC_OR_DEPARTMENT_OTHER): Payer: Self-pay

## 2024-02-20 ENCOUNTER — Ambulatory Visit (HOSPITAL_COMMUNITY): Payer: Self-pay

## 2024-02-20 LAB — POCT INR: INR: 2.4 (ref 2.0–3.0)

## 2024-03-31 ENCOUNTER — Ambulatory Visit (HOSPITAL_COMMUNITY): Admitting: Internal Medicine

## 2024-07-01 ENCOUNTER — Ambulatory Visit: Payer: Self-pay | Admitting: Infectious Disease
